# Patient Record
Sex: Female | Born: 1960 | Race: White | Hispanic: No | Marital: Married | Smoking: Former smoker
Health system: Southern US, Community
[De-identification: ages and names within clinical notes are randomized; demographics above are authoritative.]

## PROBLEM LIST (undated history)

## (undated) DIAGNOSIS — I639 Cerebral infarction, unspecified: Secondary | ICD-10-CM

## (undated) DIAGNOSIS — M2540 Effusion, unspecified joint: Secondary | ICD-10-CM

## (undated) DIAGNOSIS — F988 Other specified behavioral and emotional disorders with onset usually occurring in childhood and adolescence: Secondary | ICD-10-CM

## (undated) DIAGNOSIS — Q211 Atrial septal defect: Secondary | ICD-10-CM

## (undated) DIAGNOSIS — T8859XA Other complications of anesthesia, initial encounter: Secondary | ICD-10-CM

## (undated) DIAGNOSIS — K219 Gastro-esophageal reflux disease without esophagitis: Secondary | ICD-10-CM

## (undated) DIAGNOSIS — M1990 Unspecified osteoarthritis, unspecified site: Secondary | ICD-10-CM

## (undated) DIAGNOSIS — Z9989 Dependence on other enabling machines and devices: Secondary | ICD-10-CM

## (undated) DIAGNOSIS — G4733 Obstructive sleep apnea (adult) (pediatric): Secondary | ICD-10-CM

## (undated) DIAGNOSIS — G4730 Sleep apnea, unspecified: Secondary | ICD-10-CM

## (undated) DIAGNOSIS — IMO0002 Reserved for concepts with insufficient information to code with codable children: Secondary | ICD-10-CM

## (undated) DIAGNOSIS — F329 Major depressive disorder, single episode, unspecified: Secondary | ICD-10-CM

## (undated) DIAGNOSIS — M7061 Trochanteric bursitis, right hip: Secondary | ICD-10-CM

## (undated) DIAGNOSIS — G4700 Insomnia, unspecified: Secondary | ICD-10-CM

## (undated) DIAGNOSIS — I809 Phlebitis and thrombophlebitis of unspecified site: Secondary | ICD-10-CM

## (undated) DIAGNOSIS — M2550 Pain in unspecified joint: Secondary | ICD-10-CM

## (undated) DIAGNOSIS — G8929 Other chronic pain: Secondary | ICD-10-CM

## (undated) DIAGNOSIS — M542 Cervicalgia: Secondary | ICD-10-CM

## (undated) DIAGNOSIS — M549 Dorsalgia, unspecified: Secondary | ICD-10-CM

## (undated) DIAGNOSIS — M159 Polyosteoarthritis, unspecified: Secondary | ICD-10-CM

## (undated) DIAGNOSIS — D509 Iron deficiency anemia, unspecified: Secondary | ICD-10-CM

## (undated) DIAGNOSIS — I679 Cerebrovascular disease, unspecified: Secondary | ICD-10-CM

## (undated) DIAGNOSIS — M797 Fibromyalgia: Secondary | ICD-10-CM

## (undated) DIAGNOSIS — I82409 Acute embolism and thrombosis of unspecified deep veins of unspecified lower extremity: Secondary | ICD-10-CM

## (undated) DIAGNOSIS — Z8489 Family history of other specified conditions: Secondary | ICD-10-CM

## (undated) DIAGNOSIS — T4145XA Adverse effect of unspecified anesthetic, initial encounter: Secondary | ICD-10-CM

## (undated) DIAGNOSIS — R519 Headache, unspecified: Secondary | ICD-10-CM

## (undated) DIAGNOSIS — R110 Nausea: Secondary | ICD-10-CM

## (undated) DIAGNOSIS — R7303 Prediabetes: Secondary | ICD-10-CM

## (undated) DIAGNOSIS — M419 Scoliosis, unspecified: Secondary | ICD-10-CM

## (undated) DIAGNOSIS — Q2112 Patent foramen ovale: Secondary | ICD-10-CM

## (undated) DIAGNOSIS — R339 Retention of urine, unspecified: Secondary | ICD-10-CM

## (undated) DIAGNOSIS — F32A Depression, unspecified: Secondary | ICD-10-CM

## (undated) DIAGNOSIS — M19072 Primary osteoarthritis, left ankle and foot: Secondary | ICD-10-CM

## (undated) DIAGNOSIS — E162 Hypoglycemia, unspecified: Secondary | ICD-10-CM

## (undated) DIAGNOSIS — E039 Hypothyroidism, unspecified: Secondary | ICD-10-CM

## (undated) DIAGNOSIS — M19071 Primary osteoarthritis, right ankle and foot: Secondary | ICD-10-CM

## (undated) DIAGNOSIS — M8580 Other specified disorders of bone density and structure, unspecified site: Secondary | ICD-10-CM

## (undated) DIAGNOSIS — R413 Other amnesia: Secondary | ICD-10-CM

## (undated) DIAGNOSIS — R3915 Urgency of urination: Secondary | ICD-10-CM

## (undated) DIAGNOSIS — K5900 Constipation, unspecified: Secondary | ICD-10-CM

## (undated) DIAGNOSIS — M1711 Unilateral primary osteoarthritis, right knee: Secondary | ICD-10-CM

## (undated) DIAGNOSIS — E119 Type 2 diabetes mellitus without complications: Secondary | ICD-10-CM

## (undated) DIAGNOSIS — I879 Disorder of vein, unspecified: Secondary | ICD-10-CM

## (undated) DIAGNOSIS — M545 Low back pain: Secondary | ICD-10-CM

## (undated) DIAGNOSIS — Z973 Presence of spectacles and contact lenses: Secondary | ICD-10-CM

## (undated) DIAGNOSIS — Z86718 Personal history of other venous thrombosis and embolism: Secondary | ICD-10-CM

## (undated) DIAGNOSIS — R002 Palpitations: Secondary | ICD-10-CM

## (undated) DIAGNOSIS — M7062 Trochanteric bursitis, left hip: Secondary | ICD-10-CM

## (undated) HISTORY — DX: Primary osteoarthritis, right ankle and foot: M19.071

## (undated) HISTORY — DX: Morbid (severe) obesity due to excess calories: E66.01

## (undated) HISTORY — PX: CARDIAC CATHETERIZATION: SHX172

## (undated) HISTORY — DX: Disorder of vein, unspecified: I87.9

## (undated) HISTORY — DX: Sleep apnea, unspecified: G47.30

## (undated) HISTORY — DX: Unspecified osteoarthritis, unspecified site: M19.90

## (undated) HISTORY — DX: Low back pain: M54.5

## (undated) HISTORY — PX: VARICOSE VEIN SURGERY: SHX832

## (undated) HISTORY — DX: Hypoglycemia, unspecified: E16.2

## (undated) HISTORY — DX: Palpitations: R00.2

## (undated) HISTORY — DX: Fibromyalgia: M79.7

## (undated) HISTORY — DX: Hypothyroidism, unspecified: E03.9

## (undated) HISTORY — DX: Cerebral infarction, unspecified: I63.9

## (undated) HISTORY — PX: KNEE ARTHROPLASTY: SHX992

## (undated) HISTORY — DX: Trochanteric bursitis, left hip: M70.62

## (undated) HISTORY — PX: ESOPHAGOGASTRODUODENOSCOPY: SHX1529

## (undated) HISTORY — DX: Polyosteoarthritis, unspecified: M15.9

## (undated) HISTORY — PX: KNEE ARTHROSCOPY W/ ACL RECONSTRUCTION: SHX1858

## (undated) HISTORY — PX: EXPLORATORY LAPAROTOMY: SUR591

## (undated) HISTORY — DX: Phlebitis and thrombophlebitis of unspecified site: I80.9

## (undated) HISTORY — DX: Urgency of urination: R39.15

## (undated) HISTORY — DX: Reserved for concepts with insufficient information to code with codable children: IMO0002

## (undated) HISTORY — DX: Cervicalgia: M54.2

## (undated) HISTORY — PX: JOINT REPLACEMENT: SHX530

## (undated) HISTORY — DX: Unilateral primary osteoarthritis, right knee: M17.11

## (undated) HISTORY — DX: Cerebrovascular disease, unspecified: I67.9

## (undated) HISTORY — DX: Trochanteric bursitis, right hip: M70.61

## (undated) HISTORY — PX: VAGINAL HYSTERECTOMY: SUR661

## (undated) HISTORY — DX: Other amnesia: R41.3

## (undated) HISTORY — DX: Other chronic pain: G89.29

## (undated) HISTORY — DX: Primary osteoarthritis, left ankle and foot: M19.072

## (undated) HISTORY — PX: LAPAROSCOPIC CHOLECYSTECTOMY: SUR755

## (undated) HISTORY — PX: TOTAL KNEE ARTHROPLASTY WITH HARDWARE REMOVAL: SHX6437

## (undated) HISTORY — PX: KNEE ARTHROSCOPY: SHX127

---

## 2000-07-29 ENCOUNTER — Encounter: Payer: Self-pay | Admitting: *Deleted

## 2000-07-29 ENCOUNTER — Ambulatory Visit (HOSPITAL_COMMUNITY): Admission: RE | Admit: 2000-07-29 | Discharge: 2000-07-29 | Payer: Self-pay | Admitting: *Deleted

## 2000-08-09 ENCOUNTER — Ambulatory Visit (HOSPITAL_COMMUNITY): Admission: RE | Admit: 2000-08-09 | Discharge: 2000-08-09 | Payer: Self-pay | Admitting: *Deleted

## 2000-08-09 ENCOUNTER — Encounter: Payer: Self-pay | Admitting: *Deleted

## 2000-08-20 ENCOUNTER — Encounter: Payer: Self-pay | Admitting: *Deleted

## 2000-08-20 ENCOUNTER — Ambulatory Visit (HOSPITAL_COMMUNITY): Admission: RE | Admit: 2000-08-20 | Discharge: 2000-08-20 | Payer: Self-pay | Admitting: *Deleted

## 2000-11-20 ENCOUNTER — Encounter: Payer: Self-pay | Admitting: *Deleted

## 2000-11-20 ENCOUNTER — Ambulatory Visit (HOSPITAL_COMMUNITY): Admission: RE | Admit: 2000-11-20 | Discharge: 2000-11-20 | Payer: Self-pay | Admitting: *Deleted

## 2001-06-11 ENCOUNTER — Encounter (HOSPITAL_COMMUNITY): Admission: RE | Admit: 2001-06-11 | Discharge: 2001-07-11 | Payer: Self-pay | Admitting: Urology

## 2002-01-01 ENCOUNTER — Encounter: Payer: Self-pay | Admitting: *Deleted

## 2002-01-01 ENCOUNTER — Ambulatory Visit (HOSPITAL_COMMUNITY): Admission: RE | Admit: 2002-01-01 | Discharge: 2002-01-01 | Payer: Self-pay | Admitting: *Deleted

## 2002-01-02 ENCOUNTER — Ambulatory Visit (HOSPITAL_COMMUNITY): Admission: RE | Admit: 2002-01-02 | Discharge: 2002-01-02 | Payer: Self-pay | Admitting: *Deleted

## 2002-01-02 ENCOUNTER — Encounter: Payer: Self-pay | Admitting: *Deleted

## 2002-04-02 ENCOUNTER — Inpatient Hospital Stay (HOSPITAL_COMMUNITY): Admission: RE | Admit: 2002-04-02 | Discharge: 2002-04-04 | Payer: Self-pay | Admitting: *Deleted

## 2002-08-28 ENCOUNTER — Emergency Department (HOSPITAL_COMMUNITY): Admission: EM | Admit: 2002-08-28 | Discharge: 2002-08-28 | Payer: Self-pay | Admitting: Emergency Medicine

## 2002-08-28 ENCOUNTER — Encounter: Payer: Self-pay | Admitting: Emergency Medicine

## 2002-08-31 ENCOUNTER — Encounter: Payer: Self-pay | Admitting: *Deleted

## 2002-08-31 ENCOUNTER — Ambulatory Visit (HOSPITAL_COMMUNITY): Admission: RE | Admit: 2002-08-31 | Discharge: 2002-08-31 | Payer: Self-pay | Admitting: *Deleted

## 2002-09-08 ENCOUNTER — Ambulatory Visit (HOSPITAL_COMMUNITY): Admission: RE | Admit: 2002-09-08 | Discharge: 2002-09-08 | Payer: Self-pay | Admitting: *Deleted

## 2002-09-08 ENCOUNTER — Encounter: Payer: Self-pay | Admitting: *Deleted

## 2002-11-06 ENCOUNTER — Ambulatory Visit (HOSPITAL_COMMUNITY): Admission: RE | Admit: 2002-11-06 | Discharge: 2002-11-06 | Payer: Self-pay | Admitting: Internal Medicine

## 2002-11-06 ENCOUNTER — Encounter (INDEPENDENT_AMBULATORY_CARE_PROVIDER_SITE_OTHER): Payer: Self-pay | Admitting: Internal Medicine

## 2002-11-10 ENCOUNTER — Ambulatory Visit (HOSPITAL_COMMUNITY): Admission: RE | Admit: 2002-11-10 | Discharge: 2002-11-10 | Payer: Self-pay | Admitting: Internal Medicine

## 2002-12-10 ENCOUNTER — Other Ambulatory Visit: Admission: RE | Admit: 2002-12-10 | Discharge: 2002-12-10 | Payer: Self-pay | Admitting: Unknown Physician Specialty

## 2003-03-22 ENCOUNTER — Ambulatory Visit (HOSPITAL_COMMUNITY): Admission: RE | Admit: 2003-03-22 | Discharge: 2003-03-22 | Payer: Self-pay | Admitting: *Deleted

## 2003-03-31 ENCOUNTER — Ambulatory Visit (HOSPITAL_COMMUNITY): Admission: RE | Admit: 2003-03-31 | Discharge: 2003-03-31 | Payer: Self-pay | Admitting: Internal Medicine

## 2003-10-26 ENCOUNTER — Ambulatory Visit (HOSPITAL_COMMUNITY): Admission: RE | Admit: 2003-10-26 | Discharge: 2003-10-26 | Payer: Self-pay | Admitting: Pulmonary Disease

## 2003-10-27 ENCOUNTER — Ambulatory Visit (HOSPITAL_COMMUNITY): Admission: RE | Admit: 2003-10-27 | Discharge: 2003-10-27 | Payer: Self-pay | Admitting: Pulmonary Disease

## 2004-03-09 ENCOUNTER — Ambulatory Visit (HOSPITAL_COMMUNITY): Admission: RE | Admit: 2004-03-09 | Discharge: 2004-03-09 | Payer: Self-pay | Admitting: Pediatrics

## 2004-05-08 ENCOUNTER — Ambulatory Visit: Admission: RE | Admit: 2004-05-08 | Discharge: 2004-05-08 | Payer: Self-pay | Admitting: Family Medicine

## 2004-05-19 ENCOUNTER — Ambulatory Visit: Payer: Self-pay | Admitting: Pulmonary Disease

## 2005-03-14 ENCOUNTER — Ambulatory Visit (HOSPITAL_COMMUNITY): Admission: RE | Admit: 2005-03-14 | Discharge: 2005-03-14 | Payer: Self-pay | Admitting: Family Medicine

## 2005-03-22 ENCOUNTER — Ambulatory Visit: Payer: Self-pay | Admitting: Internal Medicine

## 2005-03-27 ENCOUNTER — Ambulatory Visit (HOSPITAL_COMMUNITY): Admission: RE | Admit: 2005-03-27 | Discharge: 2005-03-27 | Payer: Self-pay | Admitting: Internal Medicine

## 2005-03-28 ENCOUNTER — Ambulatory Visit: Payer: Self-pay | Admitting: *Deleted

## 2005-03-29 ENCOUNTER — Ambulatory Visit (HOSPITAL_COMMUNITY): Admission: RE | Admit: 2005-03-29 | Discharge: 2005-03-29 | Payer: Self-pay | Admitting: *Deleted

## 2005-03-29 ENCOUNTER — Ambulatory Visit: Payer: Self-pay | Admitting: Cardiology

## 2005-04-05 ENCOUNTER — Encounter: Admission: RE | Admit: 2005-04-05 | Discharge: 2005-07-04 | Payer: Self-pay | Admitting: Rheumatology

## 2005-04-06 ENCOUNTER — Ambulatory Visit: Payer: Self-pay | Admitting: Internal Medicine

## 2005-04-06 ENCOUNTER — Ambulatory Visit (HOSPITAL_COMMUNITY): Admission: RE | Admit: 2005-04-06 | Discharge: 2005-04-06 | Payer: Self-pay | Admitting: Internal Medicine

## 2005-05-08 ENCOUNTER — Ambulatory Visit: Payer: Self-pay | Admitting: *Deleted

## 2005-07-06 ENCOUNTER — Ambulatory Visit: Payer: Self-pay | Admitting: Internal Medicine

## 2005-07-10 ENCOUNTER — Ambulatory Visit (HOSPITAL_COMMUNITY): Admission: RE | Admit: 2005-07-10 | Discharge: 2005-07-10 | Payer: Self-pay | Admitting: Internal Medicine

## 2005-07-20 ENCOUNTER — Ambulatory Visit (HOSPITAL_COMMUNITY): Admission: RE | Admit: 2005-07-20 | Discharge: 2005-07-20 | Payer: Self-pay | Admitting: Internal Medicine

## 2005-09-13 ENCOUNTER — Ambulatory Visit (HOSPITAL_COMMUNITY): Admission: RE | Admit: 2005-09-13 | Discharge: 2005-09-13 | Payer: Self-pay | Admitting: *Deleted

## 2005-09-27 ENCOUNTER — Ambulatory Visit (HOSPITAL_COMMUNITY): Admission: RE | Admit: 2005-09-27 | Discharge: 2005-09-27 | Payer: Self-pay | Admitting: *Deleted

## 2005-10-04 ENCOUNTER — Ambulatory Visit: Payer: Self-pay | Admitting: Internal Medicine

## 2005-11-12 ENCOUNTER — Encounter (INDEPENDENT_AMBULATORY_CARE_PROVIDER_SITE_OTHER): Payer: Self-pay | Admitting: Specialist

## 2005-11-12 ENCOUNTER — Ambulatory Visit (HOSPITAL_COMMUNITY): Admission: RE | Admit: 2005-11-12 | Discharge: 2005-11-12 | Payer: Self-pay | Admitting: General Surgery

## 2005-11-15 ENCOUNTER — Ambulatory Visit (HOSPITAL_COMMUNITY): Admission: RE | Admit: 2005-11-15 | Discharge: 2005-11-15 | Payer: Self-pay | Admitting: Family Medicine

## 2005-11-23 ENCOUNTER — Ambulatory Visit (HOSPITAL_COMMUNITY): Admission: RE | Admit: 2005-11-23 | Discharge: 2005-11-23 | Payer: Self-pay | Admitting: Cardiovascular Disease

## 2005-11-23 ENCOUNTER — Ambulatory Visit: Payer: Self-pay | Admitting: Cardiovascular Disease

## 2005-11-30 ENCOUNTER — Inpatient Hospital Stay (HOSPITAL_BASED_OUTPATIENT_CLINIC_OR_DEPARTMENT_OTHER): Admission: RE | Admit: 2005-11-30 | Discharge: 2005-11-30 | Payer: Self-pay | Admitting: Cardiovascular Disease

## 2005-11-30 ENCOUNTER — Ambulatory Visit: Payer: Self-pay | Admitting: Cardiovascular Disease

## 2005-12-03 ENCOUNTER — Ambulatory Visit (HOSPITAL_COMMUNITY): Admission: RE | Admit: 2005-12-03 | Discharge: 2005-12-03 | Payer: Self-pay | Admitting: Cardiovascular Disease

## 2005-12-05 ENCOUNTER — Ambulatory Visit (HOSPITAL_COMMUNITY): Admission: RE | Admit: 2005-12-05 | Discharge: 2005-12-05 | Payer: Self-pay | Admitting: Cardiovascular Disease

## 2005-12-05 ENCOUNTER — Ambulatory Visit: Payer: Self-pay | Admitting: Cardiovascular Disease

## 2005-12-12 ENCOUNTER — Ambulatory Visit (HOSPITAL_COMMUNITY): Admission: RE | Admit: 2005-12-12 | Discharge: 2005-12-12 | Payer: Self-pay | Admitting: Cardiovascular Disease

## 2005-12-28 ENCOUNTER — Ambulatory Visit (HOSPITAL_COMMUNITY): Admission: RE | Admit: 2005-12-28 | Discharge: 2005-12-28 | Payer: Self-pay | Admitting: Family Medicine

## 2006-01-07 ENCOUNTER — Ambulatory Visit (HOSPITAL_COMMUNITY): Admission: RE | Admit: 2006-01-07 | Discharge: 2006-01-07 | Payer: Self-pay | Admitting: Family Medicine

## 2006-02-04 ENCOUNTER — Ambulatory Visit (HOSPITAL_COMMUNITY): Admission: RE | Admit: 2006-02-04 | Discharge: 2006-02-04 | Payer: Self-pay | Admitting: Orthopedic Surgery

## 2006-02-15 ENCOUNTER — Ambulatory Visit (HOSPITAL_COMMUNITY): Admission: RE | Admit: 2006-02-15 | Discharge: 2006-02-15 | Payer: Self-pay | Admitting: Internal Medicine

## 2006-02-15 ENCOUNTER — Encounter (INDEPENDENT_AMBULATORY_CARE_PROVIDER_SITE_OTHER): Payer: Self-pay | Admitting: *Deleted

## 2006-11-11 ENCOUNTER — Ambulatory Visit (HOSPITAL_COMMUNITY): Admission: RE | Admit: 2006-11-11 | Discharge: 2006-11-11 | Payer: Self-pay | Admitting: Family Medicine

## 2007-07-01 ENCOUNTER — Ambulatory Visit (HOSPITAL_COMMUNITY): Admission: RE | Admit: 2007-07-01 | Discharge: 2007-07-01 | Payer: Self-pay | Admitting: Internal Medicine

## 2007-11-12 ENCOUNTER — Ambulatory Visit (HOSPITAL_COMMUNITY): Admission: RE | Admit: 2007-11-12 | Discharge: 2007-11-12 | Payer: Self-pay | Admitting: Family Medicine

## 2008-11-19 ENCOUNTER — Ambulatory Visit (HOSPITAL_COMMUNITY): Admission: RE | Admit: 2008-11-19 | Discharge: 2008-11-19 | Payer: Self-pay | Admitting: Family Medicine

## 2009-06-21 ENCOUNTER — Ambulatory Visit (HOSPITAL_COMMUNITY): Admission: RE | Admit: 2009-06-21 | Discharge: 2009-06-21 | Payer: Self-pay | Admitting: Family Medicine

## 2009-08-09 ENCOUNTER — Ambulatory Visit: Payer: Self-pay | Admitting: Vascular Surgery

## 2010-02-11 ENCOUNTER — Encounter: Payer: Self-pay | Admitting: Family Medicine

## 2010-02-12 ENCOUNTER — Encounter: Payer: Self-pay | Admitting: Orthopedic Surgery

## 2010-02-12 ENCOUNTER — Encounter: Payer: Self-pay | Admitting: Cardiovascular Disease

## 2010-05-16 ENCOUNTER — Encounter: Payer: 59 | Attending: Family Medicine | Admitting: Dietician

## 2010-05-16 DIAGNOSIS — E663 Overweight: Secondary | ICD-10-CM | POA: Insufficient documentation

## 2010-05-16 DIAGNOSIS — Z713 Dietary counseling and surveillance: Secondary | ICD-10-CM | POA: Insufficient documentation

## 2010-06-06 NOTE — Procedures (Signed)
LOWER EXTREMITY VENOUS REFLUX EXAM   INDICATION:  Right lower extremity varicose veins with swelling.   EXAM:  Using color-flow imaging and pulse Doppler spectral analysis, the  right common femoral, superficial femoral, popliteal, posterior tibial,  greater and lesser saphenous veins are evaluated.  There is no evidence  suggesting deep venous insufficiency in the right lower extremity.   The right saphenofemoral junction is not competent with reflux of  >545milliseconds. The right GSV is not competent with reflux of  >570milliseconds with the caliber as described below.   The right proximal short saphenous vein demonstrates competency.   GSV Diameter (used if found to be incompetent only)                                            Right    Left  Proximal Greater Saphenous Vein           0.29 cm  cm  Proximal-to-mid-thigh                     cm       cm  Mid thigh                                 0.33 cm  cm  Mid-distal thigh                          cm       cm  Distal thigh                              0.48 cm  cm  Knee                                      0.36 cm  cm   IMPRESSION:  1. Right greater saphenous vein reflux with >558milliseconds is      identified with the caliber ranging from 0.29 cm to 0.48 cm knee to      groin.  2. The right greater saphenous vein is not tortuous.  3. The deep venous system is competent.  4. The right lesser saphenous vein is competent.  5. Evidence of superficial thrombus located in varicose veins off of      the greater saphenous vein.   ___________________________________________  Quita Skye. Hart Rochester, M.D.   AS/MEDQ  D:  08/09/2009  T:  08/09/2009  Job:  [id number]

## 2010-06-06 NOTE — Consult Note (Signed)
NEW PATIENT CONSULTATION   Debbie Pineda, Debbie Pineda  DOB:  [birth year]                                       08/09/2009  CHART#:[id number]   A 50 year old female developed thrombophlebitis in her right leg on May  27 at the medial and anterior aspect of the thigh.  By early July her  symptoms had resolved.  She had one previous episode of thrombophlebitis  in the right lower leg about 7 years ago.  She has no history of DVT,  venous stasis ulcers, bleeding, ulceration, etc.  She did try to wear  elastic compression stockings many years ago and this was unsuccessful  because of her obesity.  She was referred for further venous evaluation  today.  She did have a procedure performed by a Dr. Elpidio Anis 20 plus  years ago where a varicose vein was removed and ligated according to the  patient but no vein stripping was performed.  She elevates her legs on  occasion which makes them feel better and does not take ibuprofen on a  regular basis.   CHRONIC MEDICAL PROBLEMS:  1. Fibromyalgia.  2. History of stroke with a patent foramen ovale.  3. Hypothyroidism.  4. Negative for hypertension, coronary artery disease, COPD.   FAMILY HISTORY:  Positive for coronary artery disease and diabetes in  multiple family members, negative for stroke.   SOCIAL HISTORY:  She is married, has one child, is retired due to  disability.  Has not smoked in 20 years.  Does not use alcohol.   REVIEW OF SYSTEMS:  Positive for dyspnea on exertion and at rest.  No  wheezing or chronic bronchitis.  She does have discomfort in both legs  with walking, arthritis joint pain, muscle pain, depression.  All other  systems are negative in review of systems.   PHYSICAL EXAMINATION:  Vital signs:  Blood pressure 112/78, heart rate  is 84, temperature 98.4, respirations 14.  General:  She is an obese,  well-nourished female who is in no apparent distress, alert and oriented  times 3.  HEENT:  Exam is normal  for age.  EOMs intact.  Neck:  Supple,  3+ carotid pulses.  No bruits.  Lungs:  Are clear to auscultation.  No  rhonchi or wheezing.  Cardiovascular:  Regular rhythm.  No murmurs.  Abdomen:  Obese.  No palpable masses.  Musculoskeletal:  Free of major  deformities.  Neurological:  Normal.  Lower extremity exam reveals 3+  femoral, popliteal and dorsalis pedis pulses bilaterally.  There is  evidence of resolving thrombophlebitis along the anteromedial aspect of  the right thigh extending down into the mid thigh area.  There is no  tenderness at this point.  No bulging varicosities distally are noted.   Today I ordered a venous duplex exam which I reviewed and interpreted.  There is no evidence of deep venous obstruction on the right.  She does  have some reflux at the junction of the right saphenofemoral junction  but the great saphenous vein becomes quite small distal to this and it  does have reflux throughout but is of small caliber.  There is some  reflux extending into the area laterally where the thrombosis occurred.   I do not think any treatment is indicated at this time since her great  saphenous vein is quite small although  it does have reflux.  If she  develops recurrent episodes of thrombophlebitis we may need to consider  closure of the right great saphenous vein if it is able to be accessed.  She will return to see [place] on a p.r.n. basis.     Quita Skye Hart Rochester, M.Pineda.  Electronically Signed   JDL/MEDQ  Pineda:  08/09/2009  T:  08/10/2009  Job:  [id number]   cc:   Corrie Mckusick, M.Pineda.  Kirk Ruths, M.Pineda.

## 2010-06-09 NOTE — Op Note (Signed)
Debbie Pineda, Debbie Pineda             ACCOUNT NO.:  [id number]   MEDICAL RECORD NO.:  [id number]          PATIENT TYPE:  AMB   LOCATION:  SDC                           FACILITY:  [facility]   PHYSICIAN:  Ilda Mori, M.D.   DATE OF BIRTH:  10/06/1960   DATE OF PROCEDURE:  02/15/2006  DATE OF DISCHARGE:                               OPERATIVE REPORT   PREOPERATIVE DIAGNOSIS:  Pelvic masses, probable tubal remnants.   POSTOPERATIVE DIAGNOSIS:  Bilateral remnants of the fallopian tube with  torsion of the segment on the right.   PROCEDURE:  Laparoscopy with removal of tubal remnants, lysis of  adhesions and cystoscopy.   SURGEON:  Dr. Ilda Mori.   ASSISTANT:  Dr. Waynard Reeds.   ANESTHESIA:  General.   ESTIMATED BLOOD LOSS:  50 mL.   FINDINGS:  A cystic structure in the right adnexa which appeared to be  torsed.  It was approximately 7 cm in size.  Although it was torsed, the  tissue was pink with no evidence of necrosis. On the left, there was a  smaller segment approximately 3-4 cm of a cystic structure that was  adherent to the left pelvic sidewall. The ureters could not be seen  laparoscopically.   INDICATIONS:  This is a 50 year old female who is 3 years status post  vaginal hysterectomy with bilateral salpingo-oophorectomy.  The patient  was noted to have cystic structures in the abdomen on a CAT scan that  was done for evaluation of abdominal pain.  The structures appeared to  be tubal on CAT scan and a follow-up MRI and ultrasound both confirmed  what appeared to be masses consistent with hydrosalpinges. The operative  note and pathology report were studied and both described the fallopian  tubes as having been removed. These findings were discussed with the  patient, a decision was made to proceed with laparoscopy and removal of  these persistent abdominopelvic masses.   DESCRIPTION OF PROCEDURE:  The patient was taken to the operating room,  placed in the supine  position where the abdomen was prepped and draped  in a sterile fashion.  The patient was placed in dorsal supine position.  The bladder was catheterized. The abdomen was entered through an open  laparoscopic incision without difficulty.  The fascia was tagged with a  Vicryl suture and this tag was used to affix the Hassan cannula to the  peritoneum.  The laparoscope was placed and accessory instruments were  placed in the right and left lower quadrants.  The pelvis was viewed  with the findings noted above. The right ovarian remnant was untorsed  and the peritoneal attachments were cauterized and cut until the mass  was freed. The areas of coagulation cutting were carefully inspected and  appeared to be well above where the ureter could be anatomically  located. However, because of the patient's weight and subperitoneal fat,  the peristalsis of the ureter could not be identified.  The left side  was then evaluated and a small what appeared to be an occlusion cyst or  tubal remnant was noted.  This was also affixed  to the pelvic sidewall.  Again it appeared to be affixed in the area of the round ligament and  well above the ureter, but the ureter again could not be seen  peristalsing below the mass. The mass was carefully dissected free  taking care not to coagulate or cut deeply into any tissue and this mass  was free. The laparoscope was removed from the 10 mm port and an Endobag  was placed. A laparoscope was placed in one of the lower 5 mm sheaths.  The specimens were both placed in the Endobag and the Endobag was  removed through the umbilical incision. The Hassan cannula was replaced  and the laparoscope was reintroduced and hemostasis was noted to be  present and the pelvis appeared to be free. Once again, an attempt was  made to identify the ureters and this could not be accomplished. The  instruments were removed, the Vicryl suture that had been placed in the  fascia at the  umbilicus was tied, the skin was closed with subcuticular  4-0 Dexon suture.  The lower right and left port incisions were closed  with Dermabond. The surgeon then placed a cystoscope into the bladder  and both ureteral orifices were identified and could be seen creating  jets of fluid. This was felt to demonstrate the patency of the ureters  bilaterally.  The procedure was then terminated and the patient left the  operating room in good condition.      Ilda Mori, M.D.  Electronically Signed     RK/MEDQ  D:  02/15/2006  T:  02/15/2006  Job:  [id number]

## 2010-06-09 NOTE — Consult Note (Signed)
NAME:  Debbie Pineda, Debbie Pineda                       ACCOUNT NO.:  [id number]   MEDICAL RECORD NO.:  [id number]                   PATIENT TYPE:  AMB   LOCATION:  DAY                                  FACILITY:  [facility]   PHYSICIAN:  Dennie Maizes, M.D.                DATE OF BIRTH:  1960-12-29   DATE OF CONSULTATION:  DATE OF DISCHARGE:                                   CONSULTATION   PREOPERATIVE CONSULTATION REPORT:   CHIEF COMPLAINT:  Urinary incontinence, uterine prolapse, bladder prolapse.   HISTORY OF PRESENT ILLNESS:  This 50 year old female has been referred to me  by Dr. Lisette Grinder for evaluation and management of urinary incontinence.  The  patient has been having urinary leakage during coughing and sneezing for the  past 1 year.  The urinary leakage is getting worse.  She also has  experienced urinary leakage during horse back riding and singing in the  church.  She feels like she is emptying the bladder incompletely at times.  She did not have any urinary urgency.  She has urinary frequency x3-4 and  nocturia x1.  There is no history of urge urinary incontinence, hematuria,  urolithiasis, or history of urinary tract infections.  She feels that her  uterus has dropped down.  She can feel the cervix at the vaginal introitus  at times. She also has noticed bladder prolapse.  She has noted pelvic  pressure after longstanding.   PAST MEDICAL HISTORY:  History of degenerative disk disease and degenerative  joint disease, depression, status post varicose vein surgery.   MEDICATIONS:  1. Celebrex 400 mg 1 p.o. daily.  2. Lexapro 10 mg 1 p.o. daily.  3. Lortab p.r.n. for pain.  4. Tylenol and Advil p.r.n. for pain.   ALLERGIES:  Sulfa which causes rashes.   FAMILY HISTORY:  Her family history is positive for hypothyroidism, diabetes  mellitus, heart disease, and hypertension.   PHYSICAL EXAMINATION:  VITAL SIGNS:  Weight 230 pounds, height 5 feet 6  inches.  HEAD, EYES, EARS,  NOSE, AND THROAT:  Normal.  NECK:  No masses.  LUNGS:  Clear to auscultation.  HEART:  Regular rate and rhythm no murmurs.  ABDOMEN:  Soft.  No palpable flank mass.  No CVA tenderness.  Bladder not  palpable.  PELVIC:  Examination reveled uterine prolapse as well as moderate-sized  cystocele and rectocele.   LABORATORY DATA:  The patient has undergone urological evaluation in the  office.  Catheterization revealed a postvoid urine of 20 mL.  Sterile water  was instilled into the bladder and a cystometrogram was done.  The patient  had normal bladder sensations and could feel the filling of the bladder to a  volume of 66 mL.  She had a definite urge to void at 90 mL and the bladder  capacity was 342 mL.  There were no __________ bladder contractions.  Leak  back pressure was  more than 100 cm of water and the patient had a moderate  degree of stress urinary incontinence during coughing.  Urethral  hypermobility was noted.   Cystoscopy was done under local anesthesia in the office; and there was no  evidence of any abnormality inside the bladder.  Urethra calibrated to 24  [place].  There was no evidence of urethral stenosis.  Moderate size  cystocele, rectocele, as well as uterine prolapse were noted.  The patient  had moderate degree of stress urinary incontinence during coughing and  Marshall test was positive.   IMPRESSION:  Stress urinary incontinence, pelvic relaxation, uterine  prolapse, cystocele, rectocele.   PLAN:  1. I have discussed with the patient regarding management options of stress     urinary incontinence. She is planning to have vaginal hysterectomy,     bilateral salpingo-oophorectomy, anterior and posterior repair by Dr.     Lisette Grinder.  We will combine bladder suspension procedure at the same time.  2. I discussed with her about transvaginal tape ureteral sling procedure     during vaginal hysterectomy.  I explained to her regarding the operative     details,  outcome, possible risks and complications. I explained to her     regarding injury to the bladder, ureters, intestines, and blood loss.     The risks of bleeding, infection, urinary retention, post stress     incontinence, and reoperation were explained to the patient.  She made     __________; she may be on catheter drainage for a long time; she may need     intermittent catheterization.  3. If Dr. Lisette Grinder is unable to do a bilateral salpingo-oophorectomy through     the vagina, he plans to do bilateral salpingo-oophorectomy through a     suprapubic incision.  If the patient needs a suprapubic operation, we     will do a Burch vesicourethropexy instead of the TVT procedure.  Thi was     explained to the patient and she is agreeable for this plan.  All her     questions were answered.  She will be admitted to the hospital in the     postoperative period.                                               Dennie Maizes, M.D.    SK/MEDQ  D:  04/01/2002  T:  04/01/2002  Job:  [id number]   cc:   Langley Gauss, M.D.  [address]  [place]  [place] [address]  Fax: [phone]

## 2010-06-09 NOTE — Discharge Summary (Signed)
NAME:  Debbie Pineda, Debbie Pineda                       ACCOUNT NO.:  [id number]   MEDICAL RECORD NO.:  [id number]                   PATIENT TYPE:  INP   LOCATION:  A327                                 FACILITY:  [facility]   PHYSICIAN:  Langley Gauss, M.D.                DATE OF BIRTH:  Jun 12, 1960   DATE OF ADMISSION:  04/02/2002  DATE OF DISCHARGE:  04/04/2002                                 DISCHARGE SUMMARY   DISCHARGE DIAGNOSIS:  Status post vaginal hysterectomy, bilateral salpingo-  oophorectomy, anterior and posterior repair as well as TVT.   DISPOSITION:  The patient is to be seen by Dr. Sophronia Simas in one week's time.  She will follow up in our office in two weeks' time for evaluation of the  vaginal area and evaluation of the patient's suture lines.  The patient does  have a Foley catheter in place at the time of discharge which will be left  in place for about one week's duration secondary to very small nick in the  bladder which was seen during Dr. Dion Saucier cystoscopy.   DISCHARGE MEDICATIONS:  1. Climara patch for hormone replacement therapy.  2. Tylox for pain relief.  3. Levaquin 250 mg p.o. daily.   PERTINENT LABORATORY STUDIES:  Admission hemoglobin 13.1, hematocrit 37.3,  white count 8.9.  On postoperative day #1, hemoglobin 10.2, hematocrit 59.3.  On postoperative day #2, hemoglobin 10.5, hematocrit 30.8, with a white  count of 11.2.  Electrolytes within normal limits.  Liver function tests  within normal limits.  HCG negative.  B positive blood type.   HOSPITAL COURSE:  Operative procedure performed on 04/02/2002.  The Foley  catheter was left in place postoperatively.  In addition, a vaginal packing  was in place.  The patient did well as far as pain relief on the evening of  surgery.  She did have a PCA morphine pump.  She also received additional IV  Phenergan as needed for complaints of nausea.  She had no complaints of hot  flashes.  On postoperative day #1, the  patient had excellent urine output  and had moistened but no saturated the vaginal packing.  The vaginal packing  was removed.  The Foley catheter was left in place.  The patient was very  hungry and advanced her diet very rapidly such that on postoperative day #1  the patient was tolerating a regular general diet; however, she continued to  use the PCA morphine pump until the late p.m. of postoperative day #1 at  which time she was switched to oral medications.  On postoperative day #2,  the patient has been ambulatory.  She has no complaints of vaginal bleeding.  The Foley catheter was in place draining clear yellow urine, and this does  not give her any urethral irritation or discomfort.  Thus, the patient is  discharged to home on postoperative day #2.  She does have a tube  of  MetroGel from postoperative course which the patient is advised to use  q.h.s. p.r.n. for complaints of vaginal discharge associated with the  intravaginal suture lines.  Final pathology is currently pending from  operative procedure.                                               Langley Gauss, M.D.    DC/MEDQ  D:  04/06/2002  T:  04/06/2002  Job:  [id number]

## 2010-06-09 NOTE — Procedures (Signed)
NAMEJASZMINE, Debbie Pineda             ACCOUNT NO.:  [id number]   MEDICAL RECORD NO.:  [id number]          PATIENT TYPE:  OUT   LOCATION:  SLEEP LAB                     FACILITY:  [facility]   PHYSICIAN:  Marcelyn Bruins, M.D. [facility] DATE OF BIRTH:  1960/09/17   DATE OF STUDY:  05/08/2004                              NOCTURNAL POLYSOMNOGRAM   REFERRING PHYSICIAN:  Nicoletta Ba, MD   INDICATIONS FOR THE STUDY:  Hypersomnia with sleep apnea.   SLEEP ARCHITECTURE:  The patient had total sleep time of 375 minutes with  adequate slow wave sleep but decreased REM. Sleep onset latency was normal  as was REM onset.   IMPRESSION:  1.  Moderate obstructive sleep apnea/hypopnea syndrome with a respiratory      disturbance index of 33 events per hour and O2 desaturation as low as      86%. The events were clearly worse during REM. Treatment options for      this degree of sleep apnea include weight loss, upper airway surgery,      possibly oral appliance, as well as C-PAP therapy.  2.  Loud to very loud snoring noted throughout the study.  3.  No clinically significant cardiac arrhythmias.      KC/MEDQ  D:  05/16/2004 13:35:21  T:  05/16/2004 15:02:05  Job:  [id number]

## 2010-06-09 NOTE — H&P (Signed)
NAME:  Debbie Pineda, Debbie Pineda             ACCOUNT NO.:  [id number]   MEDICAL RECORD NO.:  [id number]          PATIENT TYPE:  AMB   LOCATION:  DAY                           FACILITY:  [facility]   PHYSICIAN:  Dalia Heading, M.D.  DATE OF BIRTH:  Nov 14, 1960   DATE OF ADMISSION:  DATE OF DISCHARGE:  LH                                HISTORY & PHYSICAL   CHIEF COMPLAINT:  Chronic cholecystitis, fatty liver.   HISTORY OF PRESENT ILLNESS:  The patient is a 50 year old white female who  is referred for evaluation and treatment of biliary colic secondary to  chronic cholecystitis.  She has been having right upper quadrant abdominal  pain and bloating for many years.  She does have some fatty food  intolerance.  No fever, chills, or jaundice have been noted.   PAST MEDICAL HISTORY:  Includes hypothyroidism, reflux disease,  fibromyalgia, arthritis.   PAST SURGICAL HISTORY:  Vaginal hysterectomy, right knee surgery, vein  sclerosing.   CURRENT MEDICATIONS:  Lyrica, Strattera, Cymbalta, Prevacid, hydrocodone,  Lasix, Ambien, Xanax, Armour thyroid supplements.   ALLERGIES:  SULFA which causes itching, MORPHINE which causes itching.   REVIEW OF SYSTEMS:  The patient denies smoking.  She drinks alcohol  socially.   PHYSICAL EXAMINATION:  GENERAL:  The patient is an overweight white female in no acute distress.  HEENT: Examination reveals no scleral icterus.  LUNGS:  Clear to auscultation with equal breath sounds bilaterally.  HEART:  Examination reveals regular rate and rhythm without S3, S4, or  murmurs.  ABDOMEN:  Soft with tenderness in the right upper quadrant to palpation.  No  hepatosplenomegaly, masses, or herniae are identified.   Ultrasound of the gallbladder is negative except for fatty liver.  HIDA scan  reveals chronic cholecystitis with a low gallbladder ejection fraction.   IMPRESSION:  Chronic cholecystitis.   PLAN:  The patient is scheduled for laparoscopic cholecystectomy with  liver  biopsy on November 12, 2005.  The risks and benefits of the procedure  including bleeding, infection, hepatobiliary injury, the possibility of an  open procedure were fully explained to the patient, who gave informed  consent.      Dalia Heading, M.D.  Electronically Signed     MAJ/MEDQ  D:  10/18/2005  T:  10/18/2005  Job:  [id number]   cc:   Kirk Ruths, M.D.  Fax: [phone]

## 2010-06-09 NOTE — H&P (Signed)
NAME:  Debbie Pineda, Debbie Pineda                       ACCOUNT NO.:  [id number]   MEDICAL RECORD NO.:  [id number]                   PATIENT TYPE:  INP   LOCATION:  A327                                 FACILITY:  [facility]   PHYSICIAN:  Langley Gauss, M.D.                DATE OF BIRTH:  1960/11/01   DATE OF ADMISSION:  04/02/2002  DATE OF DISCHARGE:                                HISTORY & PHYSICAL   HISTORY OF PRESENT ILLNESS:  The patient is a 50 year old gravida 1 para 1  with one prior vaginal delivery who complains of symptoms of pelvic prolapse  with she states the cervix palpable at the introitus at times, also has a  cystocele with genuine stress urinary incontinence and a symptomatic  rectocele resulting in constipation which she frequently and manually  manipulates in an effort to start a bowel movement.  The patient has been  evaluated also by Dr. Rito Ehrlich; evaluation reveals that elevation of the  bladder neck will result in improvement in the patient's bladder  functioning.   PAST MEDICAL HISTORY:  She has one prior vaginal delivery of a 9-plus-pound  infant.  She has arthritic knee pain bilaterally.  She denies a history of  hypertension or diabetes.   ALLERGIES:  SULFA, which gives her a rash.   CURRENT MEDICATIONS:  1. Lexapro 10 mg p.o. once daily (she takes at bedtime for depressive     symptoms)  2. Celebrex 400 mg p.o. once daily for arthritic knee pain  3. Lortab on a p.r.n. basis.   REVIEW OF SYSTEMS:  Pertinent for progressive weight gain, arthritic knee  pain, irregular menses, dysmenorrhea, pelvic pain with pressure.   PHYSICAL EXAMINATION:  VITAL SIGNS:  Height 5 feet 7 inches, 251, 120/78,  80, 20.  HEENT:  Negative.  No adenopathy.  Neck is supple.  Thyroid is nonpalpable.  LUNGS:  Clear.  CARDIOVASCULAR:  Regular rate and rhythm.  ABDOMEN:  Soft and nontender.  No surgical scars are identified.  No  palpable masses.  EXTREMITIES:  Normal.  PELVIC:  Normal  external genitalia.  Cervix noted to be without lesions but  it appears to have moderate descensus with the uterosacral ligaments present  at about the hymenal ring with gentle traction.  The uterus noted to normal  size, palpably has a retroflexed uterus.  The ovaries are nonpalpable but no  palpable adnexal masses are appreciated.  There is a cystocele identified  and a very large rectocele; no evidence of any enterocele.   ASSESSMENT AND PLAN:  The patient with symptoms of pelvic prolapse to  proceed with surgical therapy at this time consisting of a planned vaginal  hysterectomy.  She also desires removal of the ovaries bilaterally as she  does have a positive family history of ovarian cancer and experiences  episodic left adnexal-type pain.  Following vaginal hysterectomy, we will  attempt to proceed with a vaginal bilateral  salpingo-oophorectomy,  thereafter proceed with anterior repair and Dr. Rito Ehrlich will perform a TDT,  finally a posterior repair will be performed.  Discussed with the patient at  length removal of the ovaries.  She is adamant regarding her desire for  removal of the ovaries even if it requires proceeding to open laparotomy for  adnexal removal.  The patient is aware that this might be possible.  She is  also aware that removal of the ovaries will result in onset of surgical  menopause and this will be treated with prophylactic placement of a Climara  patch in the recovery room.  Risks and benefits of the proposed operative  procedure were discussed the patient and we are proceeding on 04/02/2002.                                               Langley Gauss, M.D.    DC/MEDQ  D:  04/02/2002  T:  04/02/2002  Job:  [id number]

## 2010-06-09 NOTE — Op Note (Signed)
NAME:  Debbie Pineda, Debbie Pineda                       ACCOUNT NO.:  [id number]   MEDICAL RECORD NO.:  [id number]                   PATIENT TYPE:  INP   LOCATION:  A327                                 FACILITY:  [facility]   PHYSICIAN:  Dennie Maizes, M.D.                DATE OF BIRTH:  Apr 11, 1960   DATE OF PROCEDURE:  04/02/2002  DATE OF DISCHARGE:                                 OPERATIVE REPORT   PREOPERATIVE DIAGNOSES:  1. Stress urinary incontinence.  2. Pelvic relaxation.   POSTOPERATIVE DIAGNOSES:  1. Stress urinary incontinence.  2. Pelvic relaxation.   PROCEDURE:  Transvaginal ureteral sling tape procedure using Uretex.   SURGEON:  Dennie Maizes, M.D.   ANESTHESIA:  General   COMPLICATIONS:  None.   ESTIMATED BLOOD LOSS:  Minimal.   DRAINS:  A 20 French Foley catheter in the bladder.   INDICATIONS FOR PROCEDURE:  This 50 year old female had troublesome stress  urinary incontinence associated with pelvic relaxation.  She was scheduled  to under vaginal hysterectomy, bilateral salpingo-oophorectomy, anterior and  posterior repair by Dr. Lisette Grinder.  I planed to do tension free transvaginal  tape ureteral sling procedure under the same anesthesia.   DESCRIPTION OF PROCEDURE:  General anesthesia was induced and the patient  was placed on the OR table in the high lithotomy position.  Dr. Lisette Grinder  proceeded with vaginal hysterectomy with bilateral salpingo-oophorectomy.  He made a __________ incision in the anterior vaginal wall to expose the  bladder.  I inserted a 30 French Foley catheter into the bladder and clear  urine was drained.  The 2 pubic tubercles were marked on the skin and two  points, about 1.5 cm lateral and above the pubic tubercles were marked.  About 10 mL of normal saline was injected on either side of the bladder neck  area for hydrodissection.   The mid urethra was then held with 2 Allis clamps and a small incision was  made.  This connected with the  incision being made by Dr. Lisette Grinder.  A  straight stylet was then inserted through the catheter and the bladder neck  was pushed to the left side.  The trocar carrying the blue-gray tube was  then inserted on the right side with digital guidance.  The trocar was  inserted behind the pubic ramus through the end double loop fascia to exit  through the previously marked point on the suprapubic area.   Cystoscopy was done.  There was a small area of entry of the trocar into the  bladder.  The trocar was withdrawn and reinserted with any difficulty.  Cystoscopy was repeated.  The trocar was found to be entirely outside the  bladder.  There was no bleeding. The trocar was then pulled out of the  suprapubic incision leaving the guide tube in place.  The trocar was  inserted on the left side in a similar fashion with digital guidance.  There  was no difficulty inserting the trocar on the left side.  Cystoscopy  confirmed that the trocars as well as the blue tips were outside the  bladder.  The Prolene mesh sling was then attached to the guide tips and  pulled through a suprapubic incision.  The bladder was filled with about 300  mL of water.  The tension of the sling was then adjusted to prevent urinary  leakage.  The Mayo scissors could be kept between the urethra and the sling  and the adjustment was made.   Dr. Lisette Grinder then proceed with completion of the cystocele repair.  A bite  was taken through the sling while  crossing the medial urethral incision to  prevent the sling from slipping backwards over the bladder.  There was no  active bleeding.  The plastic tubes were then removed.  The sling was  excised flush with the surface of the skin.  The suprapubic incision was  then closed using 3-0 Vicryl.  A 20 French Foley catheter was inserted into  the bladder and clear urine was drained.  Estimated blood loss for this  procedure was minimal.  Dr. Lisette Grinder then proceeded with posterior  repair.                                               Dennie Maizes, M.D.    SK/MEDQ  D:  04/02/2002  T:  04/02/2002  Job:  [id number]   cc:   Langley Gauss, M.D.  [address], [address]  [place]  [place] [address]  Fax: [phone]

## 2010-06-09 NOTE — Cardiovascular Report (Signed)
NAMEHAYLEY, Debbie Pineda             ACCOUNT NO.:  [id number]   MEDICAL RECORD NO.:  [id number]          PATIENT TYPE:  OUT   LOCATION:  RAD                           FACILITY:  [facility]   PHYSICIAN:  Noralyn Pick. Eden Emms, MD, FACCDATE OF BIRTH:  1960/02/16   DATE OF PROCEDURE:  DATE OF DISCHARGE:                              CARDIAC CATHETERIZATION   [name] is a 45-year patient with increasing lower extremity edema and  shortness of breath.  Left and right heart cath was done to rule out  pulmonary hypertension and assess filling pressures, as well as rule out  coronary disease.   Cine catheterization was done with 7-French right venous sheath and a 5-  French left femoral artery sheath.   Left main coronary artery was normal.   Left anterior descending artery was normal.   Circumflex coronary artery is normal.   Right coronary is dominant and normal.   RAO ventriculography:  RAO ventriculography was normal.  EF was 60%.  There  was no gradient across the aortic valve and no MR.   Aortic pressure is 116/74, LV pressure is 116 over 10.  There was no  significant post A-wave EDP.   Right heart catheterization showed no evidence of pulmonary hypertension,  despite the patient being on phentermine.  Mean right atrial pressure was  13.  RV pressure was 30/9.  PA pressure was 30/15 with a mean of 21.  Mean  pulmonary capillary wedge pressure was 12.   IMPRESSION:  The patient has no evidence of pulmonary hypertension.  Her  filling pressures were normal.  There is no significant coronary artery  disease.  She has normal LV function.   Her shortness of breath would appear to be secondary to her weight to and/or  a pulmonary process.  She did have some bronchitic changes on her chest x-  ray.   She will follow with Dr. Regino Schultze for this.  Her lower extremity edema would  appear to be secondary to her weight and also venous insufficiency with  previous vein stripping.   There is  no evidence of significant cardiopulmonary disease.           ______________________________  Noralyn Pick. Eden Emms, MD, [facility]     PCN/MEDQ  D:  11/30/2005  T:  11/30/2005  Job:  [id number]   cc:   Kirk Ruths, M.D.  [facility] Cardiology Office - [place]

## 2010-06-09 NOTE — Op Note (Signed)
[name], [name]             ACCOUNT NO.:  [id number]   MEDICAL RECORD NO.:  [id number]          PATIENT TYPE:  AMB   LOCATION:  DAY                           FACILITY:  [facility]   PHYSICIAN:  Lionel December, M.D.    DATE OF BIRTH:  1960-11-09   DATE OF PROCEDURE:  04/06/2005  DATE OF DISCHARGE:                                 OPERATIVE REPORT   PROCEDURE:  Esophagogastroduodenoscopy.   INDICATIONS:  Debbie Pineda is a 50 year old Caucasian female with several-month  history of epigastric pain who has had partial response with double-dose  PPI. She has been treated for H pylori gastritis in the past. She has had  negative ultrasound and hepatobiliary scan. She had recent abdominal pelvic  CT which was unremarkable. She is undergoing diagnostic EGD. Procedure and  risks were reviewed with the patient, and informed consent was obtained.   MEDICATIONS FOR CONSCIOUS SEDATION:  Cetacaine spray for pharyngeal topical  anesthesia, Demerol 50 mg IV, Versed 8 mg IV.   FINDINGS:  Procedure performed in endoscopy suite. The patient's vital signs  and O2 saturation were monitored during the procedure and remained stable.  The patient was placed in left lateral position, and Olympus videoscope was  passed via oropharynx without any difficulty into esophagus.   Esophagus. Mucosa of the esophagus was normal throughout. There was focal  erythema at GE junction on the gastric side. GE junction was at 40 cm from  the incisors. No hernia was apparent on today's exam. She has history of  small hernia seen on previous EGD of October 2004.   Stomach. It was empty and distended very well with insufflation. Folds of  proximal stomach were normal. Examination of mucosa revealed patchy/linear  erythema at antrum but no erosions or ulcers were noted. Angularis, fundus  and cardia were examined by retroflexing the scope and were normal.   Duodenum. Bulbar mucosa revealed focal erythema but no erosions or ulcers  were noted. Scope was passed to the second part of duodenum where mucosa and  folds were normal. Endoscope was withdrawn. The patient tolerated the  procedure well.   FINAL DIAGNOSIS:  Mild changes of reflux esophagitis limited to GE junction.   Nonerosive antral gastritis and bulbar duodenitis.   I doubt that gastroduodenitis would explain her pain. Suspect this is  secondary to NSAID therapy.   Note that she is status post treatment for H pylori gastritis in the past.   RECOMMENDATIONS:  1.  Will leave her on Prevacid 30 mg b.i.d. for another 3 months.  2.  Discontinue Levsin SL and trial with dicyclomine 10 to 20 mg t.i.d.      p.r.n.; prescription given for 60 with a refill.  3.  She will return for OV in three months from now unless her pain was to      get worse again.      Lionel December, M.D.  Electronically Signed     NR/MEDQ  D:  04/06/2005  T:  04/07/2005  Job:  [id number]

## 2010-06-09 NOTE — H&P (Signed)
NAME:  Debbie Pineda, Debbie Pineda             ACCOUNT NO.:  [id number]   MEDICAL RECORD NO.:  [id number]          PATIENT TYPE:  AMB   LOCATION:                                FACILITY:  [facility]   PHYSICIAN:  Dalia Heading, M.D.  DATE OF BIRTH:  11/13/1960   DATE OF ADMISSION:  DATE OF DISCHARGE:  LH                                HISTORY & PHYSICAL   CHIEF COMPLAINT:  Chronic cholelithiasis, fatty liver.   HISTORY OF PRESENT ILLNESS:  Patient is a 50 year old white female who is  referred for evaluation and treatment of biliary colic secondary to chronic  cholecystitis.  She has been having right upper quadrant abdominal pain and  bloating for many years.  She does have some fatty food intolerance.  No  fever, chills, or jaundice have been noted.   PAST MEDICAL HISTORY:  Includes hypothyroidism, reflux disease,  fibromyalgia, arthritis.   PAST SURGICAL HISTORY:  Vaginal hysterectomy, right knee surgery, vein  sclerosing.   CURRENT MEDICATIONS:  Lyrica, Strattera, Cymbalta, Prevacid, hydrocodone,  Lasix, Ambien, Xanax, Armour thyroid supplements.   ALLERGIES:  SULFA, which causes itching.  MORPHINE, which causes itching.   REVIEW OF SYSTEMS:  Patient denies smoking.  She drinks alcohol socially.   PHYSICAL EXAMINATION:  GENERAL:  Patient is an overweight white female in no  acute distress.  HEENT:  No scleral icterus.  LUNGS:  Clear to auscultation with equal breath sounds bilaterally.  HEART:  Regular rate and rhythm without S3, S4, or murmurs.  ABDOMEN:  Soft with tenderness noted in the right upper quadrant to  palpation.  No hepatosplenomegaly, masses, or hernias are noted.   Ultrasound of the gallbladder is negative except for a fatty liver.   HIDA scan reveals chronic cholecystitis with a low gallbladder ejection  fraction.   IMPRESSION:  Chronic cholecystitis.   PLAN:  Patient is scheduled for a laparoscopic cholecystectomy with liver  biopsy on November 12, 2005.  The risks  and benefits of the procedure,  including bleeding, infection, hepatobiliary injury, and the possibility of  an open procedure were fully explained to the patient, who gave informed  consent.      Dalia Heading, M.D.  Electronically Signed     MAJ/MEDQ  D:  10/09/2005  T:  10/09/2005  Job:  [id number]   cc:   [facility]  Fax: [phone]   Lionel December, M.D.  [address]  [place]  [place] [address]   Kirk Ruths, M.D.  Fax: [phone]

## 2010-06-09 NOTE — Op Note (Signed)
NAME:  Debbie Pineda, Debbie Pineda                       ACCOUNT NO.:  [id number]   MEDICAL RECORD NO.:  [id number]                   PATIENT TYPE:  AMB   LOCATION:  DAY                                  FACILITY:  [facility]   PHYSICIAN:  Lionel December, M.D.                 DATE OF BIRTH:  Apr 15, 1960   DATE OF PROCEDURE:  11/10/2002  DATE OF DISCHARGE:                                 OPERATIVE REPORT   PROCEDURE:  Esophagogastroduodenoscopy.   ENDOSCOPIST:  Lionel December, M.D.   INDICATION:  Foster is a 50 year old Caucasian female with 5-6 month history  of epigastric right upper quadrant pain whose ultrasound and LFTs are  normal, and hepatobiliary scan also revealed EF 50%.  She was begun on PPI  and feels somewhat better.  She is undergoing diagnostic EGD.  The procedure  and risks were reviewed with the patient.  Informed consent was obtained.   PREOPERATIVE MEDICATIONS:  1. Cetacaine spray for oropharyngeal topical anesthesia.  2. Demerol 25 mg IV.  3. Versed 8 mg IV in divided dose.   DESCRIPTION OF PROCEDURE:  The procedure was performed in the endoscopy  suite.  The patient's vital signs and O2 saturations were monitored during  the procedure and remained stable.  The patient was placed in the left  lateral decubitus position, and the Olympus video endoscope was passed via  the oropharynx without any difficulty into the esophagus.   FINDINGS:  ESOPHAGUS:  The mucosa of the esophagus was normal.  There was  focal erythema at the GE junction which was somewhat serrated.  There was a  3 cm size sliding hiatal hernia.   STOMACH:  It was empty and distended very well with insufflation.  Folds of  the proximal stomach are normal.  Examination of the mucosa revealed patchy  erythema at antrum with two tiny prepyloric erosions.  The pyloric channel  was patent.  The angularis, fundus, and cardia were examined by retroflexion  of the scope and were normal.   DUODENUM:  Examination of the  bulb revealed normal mucosa.  The folds and  mucosa in the second part of the duodenum were normal.  The endoscope was  withdrawn.   The patient tolerated the procedure well.   FINAL DIAGNOSES:  1. Small sliding hiatal hernia with mild changes of reflux esophagitis at     the gastroesophageal junction.  2. Erosive antral gastritis.  No evidence of peptic ulcer disease.   RECOMMENDATIONS:  1. H. Pylori serology will be checked today.  2. She will continue Prilosec 30 mg p.o. q.a.m. and antireflux measures.   I would like her to keep a record as to the frequency of these episodes of  pain.  If she does have these episodes, she will try Levsin/SL q.i.d. p.r.n.  I will be contacting the patient with results of blood tests and further  recommendations.      ___________________________________________  Lionel December, M.D.   NR/MEDQ  D:  11/10/2002  T:  11/10/2002  Job:  [id number]   cc:   Blair Promise, MD  [facility]

## 2010-06-09 NOTE — H&P (Signed)
NAMEWEDA, Debbie Pineda             ACCOUNT NO.:  [id number]   MEDICAL RECORD NO.:  [id number]           PATIENT TYPE:   LOCATION:                                 FACILITY:   PHYSICIAN:  Lionel December, M.D.    DATE OF BIRTH:  1960/02/17   DATE OF ADMISSION:  DATE OF DISCHARGE:  LH                                HISTORY & PHYSICAL   PRESENTING COMPLAINT:  Epigastric pain of six months' duration.   Debbie Pineda is a 50 year old Caucasian female, patient of Dr. Yetta Numbers, who is  here for scheduled visit. She states for the last three months she has been  experiencing epigastric pain. She was initially seen in October 2004 when  she was having epigastric pain mainly due to right of midline. She had an  EGD which revealed a small sliding hiatal hernia and Helicobacter pylori  gastritis, and she was treated with Prevpac. At that time, her ultrasound  was negative for cholelithiasis, and HIDA scan revealed EF of 51%. She  improved with PPI. She stopped Prevacid. For the last six months, she has  been having pain across her upper abdomen. At times, it is more on the left;  at other times, it is more on the right. Pain at times has been very intense  and doubled her over. Not eating would make the pain worse as would certain  salads. She has noted a lot of burning, but she denies heartburn, nausea,  vomiting, melena or rectal bleeding. Her bowels usually move regularly. She  states she wakes up with sacral discomfort which is almost always relieved  with defecation. She is not having any lower abdominal pain. Her appetite is  good. She has gained 14 pounds in the last 28 months. She felt weight gain  was due to fluid. She is being weaned off of her Lyrica, and Relafen dose  has also been decreased. She had one prescription left of her Prevacid. She  started a few weeks ago, and she feels 50% better. She was given three  Hemoccults by Dr. Regino Schultze. She has not done them yet. She states at times  pain is noticed when she stands up suddenly. If she stoops forward, pain is  more noticeable. However, if she supports her upper abdomen, pain tends to  decrease.   MEDICATIONS:  1.  Prevacid 30 mg b.i.d.,  2.  Cymbalta 30 mg daily.  3.  Lasix 40 to 80 mg daily.  4.  Lyrica 75 mg t.i.d.  5.  Phentermine 37.5 mg daily p.r.n.  6.  Adderall XL 30 mg daily.  7.  Nabumetone 750 mg b.i.d.  8.  Thyroid extract 90 mg daily.  9.  Aspirin 81 mg daily.  10. Vitamin C 1 g daily.  11. Albuterol inhaler 2 puffs q.i.d. p.r.n.  12. NyQuil.  13. Sudafed.  14. Robitussin p.r.n.   PAST MEDICAL HISTORY:  1.  She has ADD.  2.  Problems with early edema.  3.  Bronchitis.  4.  Fibromyalgia.  5.  Obesity.  6.  Hypothyroidism was diagnosed two years ago.  7.  Her GI workup of 2004 is reviewed above. She is status post therapy for      Helicobacter pylori gastritis.   PAST SURGICAL HISTORY:  1.  Right knee arthroscopy with allograft for ACL tear in January 2006.  2.  She is status post hysterectomy in March 2004.  3.  She also has had ____________ right leg varicose veins.   ALLERGIES:  MORPHINE, SULFA and BACTRIM.   FAMILY HISTORY:  Noncontributory. She has sister and two brothers. One  brother has Crohn's disease.   SOCIAL HISTORY:  She is married. She has one daughter. She is presently  working at [employer]. She quit cigarette smoking a few years ago,  and she drinks two to three drinks of alcohol a month.   PHYSICAL EXAMINATION:  VITAL SIGNS:  Weight 271.5 pounds. She is 5 feet 6  inches tall. Pulse 60 per minute. Blood pressure 118/80. Temperature is  98.1.  HEENT:  Conjunctiva is pink. Sclera is nonicteric. Oropharyngeal mucosa is  normal.  NECK:  No neck masses or thyromegaly noted.  CARDIAC:  Regular rate and rhythm. Normal S1 and S2. No murmur or gallop  noted.  LUNGS:  Clear to auscultation.  ABDOMEN:  Her abdomen is obese. Bowel sounds are normal. She has superficial   tenderness along the right costal margin. She also has tenderness and deep  palpation in mid epigastrium. No organomegaly or masses noted.  RECTAL:  Deferred. She does not have clubbing or peripheral edema.   LABORATORY DATA:  From March 14, 2005, WBC 6.0, H and H is 13.3 and 40,  platelet count 280,000. Bilirubin 0.3, AP 101, AST 22, ALT 26, total protein  7.6 with albumin of 4.4, calcium 9.1. Her TSH was 1.975, and hemoglobin A1c  was 5.6.   ASSESSMENT:  [name] is a 50 year old Caucasian female who presents with  recurrent abdominal pain across her upper abdomen. She is 50% better with  Prevacid. She is on Relafen plus low-dose aspirin. She is therefore at risk  for peptic ulcer disease. However, I feel she also has musculoskeletal pain  probably related to her fibromyalgia. If PUD has been ruled out and pancreas  and biliary tract are normal, she may eventually end up with EUS or biliary  manometry.   PLAN:  1.  She will continue Prevacid 30 mg p.o. b.i.d.; prescription given for 60      with 3 refills.  2.  Levsin SL 1 to 2 t.i.d. p.r.n. when she has sharp pain.  3.  Abdominal CT with oral and IV contrast. If CT is normal, she will      undergone esophagogastroduodenoscopy.      Lionel December, M.D.  Electronically Signed     NR/MEDQ  D:  03/22/2005  T:  03/22/2005  Job:  [id number]   cc:   [facility]  Fax: [phone]   Kirk Ruths, M.D.  Fax: [phone]

## 2010-06-09 NOTE — Assessment & Plan Note (Signed)
[facility]                         [place] CARDIOLOGY OFFICE NOTE   Debbie Pineda, Debbie Pineda                    MRN:          [id number]  DATE:12/05/2005                            DOB:          11/03/1960    Debbie Pineda returns today for followup.  She is a 51 year old patient I initially  saw at the request of Dr. Regino Schultze.  She had increasing lower extremity  edema, increasing shortness of breath, and had been on phentermine.  We did  a right and left heart cath on her.  She had normal coronaries.  No evidence  of pulmonary hypertension, normal filling pressures, normal right-sided  pressures.  She has developed some right lower quadrant pain since her heart  cath.  CT scan today showed a small fluid collection in the right inguinal  area, adjacent to the right femoral vein.  I suspect that she had a small  venous bleed.   She will have a followup hemoglobin and crit today.  Her baseline hematocrit  prior to her cath was 37.   She also had a venous duplex, which showed no evidence of DVT.  I told Debbie Pineda  that I felt a lot of her symptoms were secondary to her weight.  She has  some chronic gallbladder problems that need to be resolved.  From a cardiac  perspective, she is cleared to have laparoscopic surgery if she needs it.  However, she may need to further consider bariatric surgery.   I told her to followup with Dr. Regino Schultze in regards to her dyspnea, as there  is no evidence of phentermine-induced pulmonary hypertension, no evidence of  LV dysfunction, and her filling pressures are all normal by cath.  From our  standpoint, we will follow her groin closely.  We will do a hemoglobin and  crit today.  She will have a followup CT scan in a week.  I spoke personally  with Dr. Anselmo Pickler in radiology, and he thought this would be best.   EXAM:  She is not in much distress.  The blood pressure is stable.  She is not hypotensive at 110/78, which  is  the same blood pressure she had on November 23, 2005.  Pulses 78 and regular.  HEENT:  Normal.  There is no lymphadenopathy.  No thyromegaly.  There are no carotid bruits.  LUNGS:  Clear.  Carotid heart sounds are normal.  ABDOMEN:  Obese.  There is some mild tenderness in the right lower quadrant  with no hematoma.  There is no rebound.  The puncture site itself is healed  well and there is no bruit to suspect fistula formation.  Abdomen is benign.  She has +1 lower extremity edema bilaterally with palpable pulses.   IMPRESSION:  Stable lower extremity edema.  Continue Lasix 40 b.i.d.  No  evidence of venous deep vein thrombosis or reflux.  Normal right-sided heart  pressures.  No evidence of pulmonary  hypertension.  Followup with Dr. Regino Schultze in regards to shortness of breath  and possible  pulmonary function tests.  Hemoglobin and hematocrit today with followup CT  to follow  up small venous hematoma and fluid collection at cath site.     Debbie Pick. Eden Emms, MD, [facility]  Electronically Signed    PCN/MedQ  DD: 12/05/2005  DT: 12/05/2005  Job #: [id number]

## 2010-06-09 NOTE — Procedures (Signed)
Debbie Pineda, Debbie Pineda             ACCOUNT NO.:  [id number]   MEDICAL RECORD NO.:  [id number]          PATIENT TYPE:  OUT   LOCATION:  RAD                           FACILITY:  [facility]   PHYSICIAN:  Nephi Bing, M.D. [facility] OF BIRTH:  1960/04/20   DATE OF PROCEDURE:  03/29/2005  DATE OF DISCHARGE:                                  ECHOCARDIOGRAM   REFERRING:  Dr. Regino Schultze and Dr. Dorethea Clan.   CLINICAL DATA:  A 50 year old woman with palpitations.   M-MODE:  Aorta 3.2, left atrium 3.9, septum 1.4, posterior wall 1.3, LV  diastole 4.4, LV systole 2.8.   1.  Technically adequate echocardiographic study.  2.  Normal left atrial and right atrial size. The right ventricle is      prominent but not frankly enlarged. No RVH. Normal RV function.  3.  Normal trileaflet aortic valve; minimal annular calcification.  4.  Normal pulmonic valve and proximal pulmonary artery.  5.  Normal tricuspid valve.  6.  Normal mitral valve; mild mitral annular calcification.  7.  Normal left ventricular size; no LVH; normal regional and global      function.  8.  Normal IVC.      [place] Bing, M.D. [facility]  Electronically Signed     RR/MEDQ  D:  03/29/2005  T:  03/30/2005  Job:  [id number]

## 2010-06-09 NOTE — Consult Note (Signed)
NAME:  Debbie Pineda                       ACCOUNT NO.:  [id number]   MEDICAL RECORD NO.:  [id number]                   PATIENT TYPE:   LOCATION:                                       FACILITY:   PHYSICIAN:  Lionel December, M.D.                 DATE OF BIRTH:  02/09/1960   DATE OF CONSULTATION:  11/02/2002  DATE OF DISCHARGE:                                   CONSULTATION   REQUESTING PHYSICIAN:  Dr. Laveda Abbe.   REASON FOR CONSULTATION:  Right upper quadrant abdominal pain.   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 Laveda Abbe, who presents today for  further evaluation of five-month history of right upper quadrant abdominal  pain. She started having pain intermittently about months ago, but this has  progressively worsened now, occurs daily. In August, she has severe pain and  presented to the emergency department. LFTs at that time were normal. She  has had a workup including chest x-ray which was negative. Abdominal  ultrasound was negative. A MR abdomen in August was also unremarkable. The  patient states that she is having symptoms post prandially, specifically  with certain foods including vegetables and orange juice. She denies any  typical heartburn symptoms. She noted some increase pain, especially with  bending over. The pain radiates around the right flank into her right back.  Denies any associated nausea or vomiting. No change in bowel habits. She has  chronically had three to five formed bowel movements daily. Denies any  melena or rectal bleeding. Denies any weight loss, change in appetite. She  consumes 800 to 1,600 of ibuprofen daily and has done so for several months.  She takes this for degenerative joint disease of the knees. She also  consumes approximately six beers weekly.   CURRENT MEDICATIONS:  Ibuprofen 800 mg one to two tablets daily p.r.n.   ALLERGIES:  SULFA causes  rash.   PAST MEDICAL HISTORY:  1. Degenerative joint disease of the knees.  2. Degenerative disk disease.   PAST SURGICAL HISTORY:  1. Hysterectomy in March 2004.  2. Right leg varicose vein stripping.   FAMILY HISTORY:  Significant for diabetes in father, brother, and sister.  She has a brother with Crohn's disease. No family history of colorectal  cancer.   SOCIAL HISTORY:  She has been married for 22 years. Has one daughter. 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 She quit smoking. She denies any  tobacco use; however, smoked for a few years during her teenage years. She  consumes approximately six beers weekly.   REVIEW OF SYSTEMS:  Please see HPI for GI and for general. CARDIOPULMONARY:  Denies any chest pain or shortness of breath.   PHYSICAL EXAMINATION:  VITAL SIGNS:  Weight 257, height 5 foot 7,  temperature 97.7, blood pressure 100/78, pulse 74.  GENERAL:  Pleasant, well-developed, well-nourished, Caucasian female in no  acute distress.  SKIN:  Warm and dry with no jaundice.  HEENT:  Conjunctivae are pink. Sclerae nonicteric. Oropharyngeal mucosa  moist and pink. No lesions, erythema, or exudate. No lymphadenopathy or  thyromegaly.  CHEST:  Lungs clear to auscultation.  CARDIAC:  Reveals regular rate and rhythm. Normal S1 and S2. No murmurs,  rubs, or gallops.  ABDOMEN:  Positive bowel sounds, soft, nondistended. She has mild tenderness  just right of the epigastrium to deep palpation. No organomegaly or masses.  EXTREMITIES:  No edema.   IMPRESSION:  [name] is a pleasant 50 year old lady with five-month history of  epigastric/right upper quadrant abdominal pain which typically occurs post  prandially. Workup thus far includes an unremarkable chest x-ray, abdominal  ultrasound, and MR abdomen. We cannot rule out biliary dyskinesia. In  addition, given her significant ibuprofen use, we would be concerned about  peptic ulcer disease.   PLAN:  1. Go ahead  and schedule her for an upper endoscopy. In the meantime, we     will obtain a HIDA scan with CCK challenge. Will begin her on Prevacid 30     mg daily, #30 samples given.  2. Obtain LFTs.  3. Further recommendations to follow.     ________________________________________  ___________________________________________  Tana Coast, P.A.                         Lionel December, M.D.   LL/MEDQ  D:  11/02/2002  T:  11/02/2002  Job:  [id number]   cc:   Laveda Abbe, M.D.

## 2010-06-09 NOTE — H&P (Signed)
[facility] ADMISSION   Debbie Pineda, Debbie Pineda                    MRN:          [id number]  DATE:11/23/2005                            DOB:          1960-10-24    Ms. Steelman is seen today as a new consult by me.  She was referred back  by Dr. Regino Schultze.  She has been having increasing shortness of breath and  lower extremity edema.   The patient is approximately 2 weeks status post laparoscopic  cholecystectomy with liver biopsy.  She has recovered well from this.  She  has had long-standing epigastric and chest pain.  Apparently, there has not  been a history of gallstones but Dr. Karilyn Cota did some sort of intraesophageal  ultrasound and showed sludge.  The patient has recovered well from this.  However, she continues to have increasing lower extremity edema and  shortness of breath.  There was no history of asthma, COPD, or smoking.  She  has had some PND and orthopnea.  There is no history of PE or lower  extremity DVT.  The patient has been on increasing dosage of Lasix.  She is  hypothyroid but is on replacement therapy.  The patient saw Dr. Dorethea Clan back  in March.  At the time she was primarily complaining of palpitations,  shortness of breath, and lower extremity edema.  She had a 2D echocardiogram  which essentially showed normal LV function with no significant valve  disease, no evidence of pulmonary hypertension.  At the time, she apparently  had a positive sleep study and a chest x-ray which showed mild bronchitic  changes.   In talking to the patient, she is a Engineer, civil (consulting).  She is concerned about the  increasing dosage of medications that are required to treat her lower  extremity edema.  Note, should be made that she is also on Phentermine.  She  has been on it for a few months.  She has currently been taking it  sporadically.  I explained to her that this is part of the phen-phen  combination and has  been associated with pulmonary hypertension.  I told her  I would rather have her entertain bariatric surgery then take appetite  suppressants.   1. THE PATIENT IS ALLERGIC TO MORPHINE WHICH CAUSES ITCHINESS.  2. SHE IS ALSO ALLERGIC TO SULFA.  3. BACTRIM.   FAMILY HISTORY:  Remarkable for diabetes on her mother's side.   The patient works as a Patent examiner.  She has been inactive lately  partly due to her gallbladder problems and partly due to her increasing  lower extremity edema.  She does not smoke.  She does not drink excessively.   MEDICATIONS:  1. Lyrica 75 t.i.d.  2. Aspirin a day.  3. Lortab 70/500.  4. Phentermine 7.5, half tab a day.  5. Lasix 40 b.i.d.  6. Cymbalta 60 a day.  7. __________  once a day.  8. Folic acid 1 mg a day.   PHYSICAL EXAMINATION:  GENERAL:  She is overweight.  SKIN:  Warm and  dry.  HEENT:  Normal.  There is no thyromegaly.  There is no lymphadenopathy.  JVP  is not visible.  LUNGS:  Clear.  There was no wheezing.  HEART:  There is an S1 S2 with distant heart sounds.  ABDOMEN:  Benign.  She is status post lap chole.  EXTREMITIES:  Distal pulses are intact.  She has +1 edema bilaterally.  There are no cords and no evidence of chronic venous insufficiency.  PTs are  palpable bilaterally.   Her EKG is normal with no evidence of cor pulmonale and no evidence of  previous infarct.   IMPRESSION:  Essentially, we have an overweight young female with increasing  symptoms of edema and dyspnea.   The patient has been on phentermine which has been associated with pulmonary  hypertension.  She had a benign echocardiogram in March.  Given the  patient's positive sleep study, use of phentermine, and increasing lower  extremity edema and dyspnea, I think it is reasonable to proceed with a  right and left heart cath.   The patient is concerned about her symptoms and escalating dosage of Lasix.  I think it is important, particularly if she is to  entertain bariatric  surgery in the future to make sure that her pulmonary pressures and coronary  arteries are fine with no evidence of structural heart disease.   If the patient's right and left heart cath is normal, she will then follow  up with a venous Duplex of the lower extremities to rule out venous  insufficiency.  I suspect that some of her lower extremity edema is actually  lymph edema.  Fortunately, the patient is currently apparently not a type 2  diabetic.  I explained to her that I am not a fan of appetite suppressants  and my own bias would be to stop the phentermine and have her entertain lap  banding or some other fairly noninvasive bariatric surgery.   For the time being she will continue her current dose of Lasix 40 b.i.d. We  will do pre-cath labs including a BNP to assess her filling status.  Further  recommendations will be based on the results of her right and left heart  cath.   The risk of catheterization including stroke, bleeding, need for vascular  repair, contrast allergy were discussed.  She is willing to proceed.    ______________________________  Noralyn Pick Eden Emms, MD, [facility]    PCN/MedQ  DD: 11/23/2005  DT: 11/23/2005  Job #: [id number]   cc:   Kirk Ruths, M.D.

## 2010-06-09 NOTE — Op Note (Signed)
NAME:  Debbie Pineda, Debbie Pineda                       ACCOUNT NO.:  [id number]   MEDICAL RECORD NO.:  [id number]                   PATIENT TYPE:  INP   LOCATION:  A327                                 FACILITY:  [facility]   PHYSICIAN:  Langley Gauss, M.D.                DATE OF BIRTH:  1960/12/10   DATE OF PROCEDURE:  04/02/2002  DATE OF DISCHARGE:                                 OPERATIVE REPORT   PREOPERATIVE DIAGNOSES:  1. Pelvic prolapse with uterine prolapse.  2. Symptomatic rectocele with resultant constipation.  3. Symptomatic cystocele with resultant genuine stress urinary incontinence.  4. Left adnexal pain.   POSTOPERATIVE DIAGNOSES:  1. Pelvic prolapse with uterine prolapse.  2. Symptomatic rectocele with resultant constipation.  3. Symptomatic cystocele with resultant genuine stress urinary incontinence.  4. Left adnexal pain.   PROCEDURE:  1. Vaginal hysterectomy with bilateral salpingo-oophorectomy.  2. Anterior repair with tension-free vaginal tape procedure performed by Dr.     Rito Ehrlich.  3. Repair of rectocele.   SURGEON:  Primary surgeon:  Dr. Roylene Reason. Lisette Grinder  TVT procedure performed by Dr. Rito Ehrlich.   ANESTHESIA:  General endotracheal   ESTIMATED BLOOD LOSS:  600 mL   SPECIMENS:  For permanent section only.   DRAINS:  A Foley catheter is left within the drain postoperatively in  addition to vaginal packing saturated with MetroGel was placed  intravaginally.   FINDINGS AT THE TIME OF SURGERY:  Include a very large rectocele. No  enterocele identified.  The cystocele, itself, was only a first degree  cystocele, but the urethra is elevated by the TVT procedure.  The technical  difficulty of the operative procedure is complicated by patient's complaint  of some low sacral type pain as well as some arthritic knee pain.  Thus,  rather than being placed in the candy cane stirrups for maximum exposure she  is rather placed in the low lithotomy, laparoscopic  stirrups.   DESCRIPTION OF PROCEDURE:  The patient was taken to the operating room.  Vital signs were stable.  The patient underwent uncomplicated induction of  general endotracheal anesthesia after which time she was carefully placed in  the low lithotomy position.  She was prepped and draped in the usual sterile  manner; prepped with a Betadine solution.  A short weighted speculum placed  within the vaginal vault.  The anterior and posterior lip of the cervix was  grasped with a thyroid tenaculum.  Traction then resulted in the descent of  the leading, most portion of the uterosacral ligaments to the hymenal ring.   A total of 20 mL of 0.5% bupivacaine with epinephrine was then injected  circumferentially around the cervix to raised small wheals beneath the  mucosa.  The uterus is then retracted anteriorly.  The cul-de-sac is  identified.  A large Mayo scissors was used to atraumatically enter into the  posterior cul-de-sac with findings of clear peritoneal fluid.  A suture is  placed within the posterior peritoneum as well as the posterior vaginal  mucosa and tagged for later inspection.   The uterosacral ligaments are then ligated initially.  First the right  followed by the left, clamped with a curved Heaney clamp followed by suture  ligature of #0 Vicryl in a Heaney fashion which is tagged for later  inspection.  The uterus is then retracted posteriorly at which time I used a  sharp knife to dissect down to the vaginal mucosa, down to about the level  of the bladder peritoneum which was then bluntly mobilized up towards the  uterine fundus in an effort to move the bladder out of the present surgical  site.  On the right and then on the left a curved Zeppelin clamp is placed  around the cardinal ligament followed by suture ligature of #0 Vicryl in a  Heaney fashion.  This then brought me to the level of the uterine arteries  bilaterally.  The right uterine artery is clamped first  with a curved  Zeppelin clamp followed by suture ligature of #0 Vicryl in a Heaney fashion.  The left uterine vessel clamped second with a curved Zeppelin followed by #0  Vicryl in a Heaney fashion.   With the major vascular supply controlled, I now turned by attention back to  the bladder flap.  I was able to visualize the bladder reflection on the  lower uterine segment.  Utilizing dissection with the Metzenbaum scissors, I  was able to dissect down beneath the bladder flap and create a bladder flap  from the vesicouterine fold which then allows this to be elevated over the  uterine fundus utilizing the curved Deaver retractor. With the bladder now  mobilized out of the operative field the anterior cul-de-sac is noted to  have been entered.   This then brought me up to the level of the uteroovarian as well as the  round ligament and fallopian tubes bilaterally.  The right round ligament  and right fallopian tube are grasped first with a curved Zeppelin clamp  followed by 2 ties; first with a free tie of #0 Vicryl followed by a suture  ligature of #0 Vicryl in a Heaney fashion which was then tagged.  On the  patient's other side, likewise a curved Zeppelin clamp was placed followed  by a suture ligature of #0 Vicryl after placing a free-tie of #0 Vicryl.  Each of the utero-ovarian ligaments is then identified and each is clamped  separately with curved Zeppelin clamps followed by separate ligation with  suture ligature of #0 Vicryl in a Heaney fashion.  This then allows complete  removal of the operative specimen which is examined and noted to be the  entirety of the cervix as well as the uterine fundus itself.   I then identified and examined the vaginal cuff.  I was able to easily see  and manipulate the right ovary which is noted to have a 2 cm functional cyst  which is noted to rupture during its manipulation.  A Babcock clamp is used to grasp the right ovary which is then  retracted slightly.  The  infundibulopelvic ligament is then identified after clamping with a curved  Zeppelin clamp a free-tie of #0 Vicryl was placed around the right  infundibulopelvic ligament and then transected.  The left ovary is more  difficult to identify; however, by utilizing a sponge stick I was able to  mobilize bowel out of the operative  field. I identified the left ovary which  was then grasped with a Babcock clamp.  Gentle retraction was then performed  which results in identification of the infundibulopelvic ligament and after  clamping with a curved Zeppelin clamp a free-tie of #0 Vicryl was placed to  secure its hemostasis on the left.  Each of the ovaries is then removed in  its entirety and examined and noted to be entirely included in our  specimens.   At this point in time our sponge and instrument counts are correct x2 at  this point.  Due to some bleeding in the posterior vaginal cuff I did close  the posterior vaginal cuff, at this point, with a continuous running 2-0  Vicryl suture in a running lock fashion extending from 1 uterosacral  ligament to the other.  Dr. Rito Ehrlich is now notified of our current position  in the surgical procedure.  I started performing the anterior repair by  grasping the anterior vaginal mucosa with Allis clamps, gently retracting  which then allows me to dissect in the avascular plane between the vaginal  mucosa and the underlying vesicovaginal fascia.  This is continued to near  the level of the urethra which then allows me to dissect laterally on each  side in the avascular plane just beneath the vaginal mucosa.  With the  lateral dissection completed, Dr. Rito Ehrlich then performed a TVT procedure.  I did assist him with this procedure.   After performance of the TVT, Dr. Rito Ehrlich assisted while I closed the  anterior vaginal wall.  The anterior repair was closed by utilizing a total  of 4 vertical mattress sutures of #0 Vicryl  through the vesicovaginal fascia  with redundant fascia being oversewn in the midline.  The vaginal mucosa is  then closed with a continuous running #0 Vicryl suture to result in secure  closure.   Following this, a bivalve speculum was placed which allows visualization of  the vaginal cuff.  The vaginal cuff, at this point in time, appears to be  free with good hemostasis and no active bleeding identified.  The vaginal  cuff is then closed utilizing a continuous running locked #0 Vicryl suture  going from anterior to posterior and reapproximating the vaginal mucosa in  the midline.  All bleeders are controlled.  Hemostasis assured.  I then  proceeded with the posterior repair.  At the level of the posterior  vestibule laterally Allis clamps were placed which then allows me to utilize  a sharp knife to transect between the two and a triangle-shaped wedge of  skin is removed from the perineal body.  This then allows me to transect and dissect in the avascular plane between the posterior vaginal mucosa and the  tuborectal fascia.  This is continue to near the level of the vaginal apex  and then the dissection is continued laterally with dissection of the  posterior vaginal mucosa off the underlying fascial layer laterally and then  atraumatically.  This then allows me to reduce and repair the rectocele  posteriorly by placing a series of 8 vertical mattress sutures of #0 Vicryl  with oversewing of redundant puborectal fascia in the midline and reduction  of a large rectocele.   The overlying vaginal mucosa is closed with a continuous running #0 Vicryl  suture.  The perineal body is then repaired by identifying the superficial  transverse perineal muscle which is then reduced and sutured in place by  placing lateral vertical mattress sutures and  bringing them together in the  midline with oversewing of redundant fascia.  This results in a narrowing of  the vaginal opening and restoration of  what is more seemingly the normal  initial anatomy.  The mucosa and overlying skin of the posterior vagina as  well as the perineal body is then closed with a #0 Vicryl suture followed by  a continuous running #0 Vicryl suture on the perineal body.  This results in  much improved support of the anterior and posterior vaginal mucosa. A pack  was then placed by placing a single packing of material saturated with a  MetroGel solution.   The patient, at this point in time, continues to drain clear yellow urine.  The packing is placed without difficulty.  There is no evidence of any  active bleeding ongoing.  The patient's vital signs remain stable.  Thus she  is reversed of anesthesia and taken to the recovery room in stable  condition, at which time operative findings are discussed with the patient's  awaiting family.                                               Langley Gauss, M.D.    DC/MEDQ  D:  04/02/2002  T:  04/03/2002  Job:  [id number]

## 2010-06-09 NOTE — Op Note (Signed)
Debbie Pineda, Debbie Pineda             ACCOUNT NO.:  [id number]   MEDICAL RECORD NO.:  [id number]          PATIENT TYPE:  AMB   LOCATION:  DAY                           FACILITY:  [facility]   PHYSICIAN:  Dalia Heading, M.D.  DATE OF BIRTH:  05/02/1960   DATE OF PROCEDURE:  11/12/2005  DATE OF DISCHARGE:                                 OPERATIVE REPORT   PREOPERATIVE DIAGNOSIS:  Chronic cholecystitis, hepatitis.   POSTOPERATIVE DIAGNOSIS:  Chronic cholecystitis, hepatitis.   PROCEDURE:  Laparoscopic cholecystectomy, liver biopsy.   SURGEON:  Dalia Heading, M.D.   ANESTHESIA:  General endotracheal.   INDICATIONS:  The patient is a 50 year old morbidly obese white female who  presents with chronic cholecystitis.  She also is noted on ultrasound of the  gallbladder to have a fatty liver, and Dr. Karilyn Cota has request liver biopsy.  Risks and benefits of the procedure, including bleeding, infection,  hepatobiliary injury, and the possibility of an open procedure were fully  explained to the patient, who gave informed consent.   PROCEDURE NOTE:  The patient was placed in the supine position.  After  induction of general endotracheal anesthesia, the abdomen was prepped and  draped using the usual sterile technique with Betadine.  Surgical site  confirmation was performed.   A supraumbilical incision was made down to the fascia.  A Veress needle was  introduced into the abdominal cavity, and confirmation of placement was done  using the saline drop test.  The abdomen was then insufflated to 16 mmHg  pressure.  An 11 mm trocar was introduced into the abdominal cavity under  direct visualization without difficulty.  The patient was placed in reverse  Trendelenburg position.  An additional 1 mm trocar was placed in the  epigastric region, and 5 mm trocars were placed in the right upper quadrant  and right flank regions.  Liver was inspected and noted to be fatty, but  within normal limits.  No  specific lesions were noted.  A single Tru-Cut  needle biopsy was taken of the right lobe of the liver.  This was sent to  pathology for further examination.  Bleeding was controlled using Bovie  electrocautery.  The gallbladder was retracted superior and laterally.  The  dissection was begun around the infundibulum of the gallbladder.  The cystic  duct was first identified.  Its juncture to the infundibulum was fully  identified.  Endoclips were placed proximally and distally on the cystic  duct, and the cystic duct was divided.  This was likewise done to the cystic  artery.  The gallbladder was then freed away from the gallbladder fossa  using Bovie electrocautery.  The gallbladder was delivered through the  epigastric trocar site using an EndoCatch bag.  The gallbladder fossa was  inspected, and no abnormal bleeding or bile leakage was noted.  Surgicel was  placed in the gallbladder fossa.  All fluid and air were then  evacuated  from the abdominal cavity prior to removal of the trocars.   All wounds were irrigated with normal saline.  All wounds were injected with  0.5%  Sensorcaine.  The supraumbilical fascia was reapproximated using an 0  Vicryl interrupted suture.  All skin incisions were closed using staples.  Betadine ointment dry sterile dressings were applied.   All tape and needle counts correct at the end of the procedure.  The patient  was extubated in the operating room and went back to the recovery room  awake, in stable condition.   COMPLICATIONS:  None.   SPECIMENS:  Gallbladder, liver biopsy.   BLOOD LOSS:  Minimal.      Dalia Heading, M.D.  Electronically Signed     MAJ/MEDQ  D:  11/12/2005  T:  11/12/2005  Job:  [id number]   cc:   Short Stay   Kirk Ruths, M.D.  Fax: [phone]   Lionel December, M.D.  [address]  [place]  [place] [address]

## 2010-07-05 ENCOUNTER — Other Ambulatory Visit (INDEPENDENT_AMBULATORY_CARE_PROVIDER_SITE_OTHER): Payer: Self-pay | Admitting: Surgery

## 2010-07-05 LAB — CBC WITH DIFFERENTIAL/PLATELET
Basophils Absolute: 0 10*3/uL (ref 0.0–0.1)
Basophils Relative: 0 % (ref 0–1)
Eosinophils Absolute: 0.2 10*3/uL (ref 0.0–0.7)
Eosinophils Relative: 3 % (ref 0–5)
HCT: 41.3 % (ref 36.0–46.0)
Hemoglobin: 13.4 g/dL (ref 12.0–15.0)
Lymphocytes Relative: 34 % (ref 12–46)
Lymphs Abs: 2.9 10*3/uL (ref 0.7–4.0)
MCH: 31 pg (ref 26.0–34.0)
MCHC: 32.4 g/dL (ref 30.0–36.0)
MCV: 95.6 fL (ref 78.0–100.0)
Monocytes Absolute: 0.4 10*3/uL (ref 0.1–1.0)
Monocytes Relative: 5 % (ref 3–12)
Neutro Abs: 5 10*3/uL (ref 1.7–7.7)
Neutrophils Relative %: 59 % (ref 43–77)
Platelets: 270 10*3/uL (ref 150–400)
RBC: 4.32 MIL/uL (ref 3.87–5.11)
RDW: 14.2 % (ref 11.5–15.5)
WBC: 8.5 10*3/uL (ref 4.0–10.5)

## 2010-07-05 LAB — COMPREHENSIVE METABOLIC PANEL
ALT: 17 U/L (ref 0–35)
AST: 12 U/L (ref 0–37)
Albumin: 4.3 g/dL (ref 3.5–5.2)
Alkaline Phosphatase: 96 U/L (ref 39–117)
BUN: 13 mg/dL (ref 6–23)
CO2: 22 mEq/L (ref 19–32)
Calcium: 9.5 mg/dL (ref 8.4–10.5)
Chloride: 106 mEq/L (ref 96–112)
Creat: 1.03 mg/dL (ref 0.50–1.10)
Glucose, Bld: 103 mg/dL — ABNORMAL HIGH (ref 70–99)
Potassium: 4.6 mEq/L (ref 3.5–5.3)
Sodium: 140 mEq/L (ref 135–145)
Total Bilirubin: 0.3 mg/dL (ref 0.3–1.2)
Total Protein: 7.2 g/dL (ref 6.0–8.3)

## 2010-07-05 LAB — LIPID PANEL
Cholesterol: 214 mg/dL — ABNORMAL HIGH (ref 0–200)
HDL: 51 mg/dL (ref >39)
LDL Cholesterol: 119 mg/dL — ABNORMAL HIGH (ref 0–99)
Total CHOL/HDL Ratio: 4.2 Ratio
Triglycerides: 221 mg/dL — ABNORMAL HIGH (ref <150)
VLDL: 44 mg/dL — ABNORMAL HIGH (ref 0–40)

## 2010-07-05 LAB — TSH: TSH: 5.98 u[IU]/mL — ABNORMAL HIGH (ref 0.350–4.500)

## 2010-07-12 ENCOUNTER — Encounter: Payer: 59 | Attending: Family Medicine | Admitting: *Deleted

## 2010-07-12 DIAGNOSIS — E663 Overweight: Secondary | ICD-10-CM | POA: Insufficient documentation

## 2010-07-12 DIAGNOSIS — Z713 Dietary counseling and surveillance: Secondary | ICD-10-CM | POA: Insufficient documentation

## 2010-07-13 ENCOUNTER — Ambulatory Visit (HOSPITAL_COMMUNITY): Admit: 2010-07-13 | Payer: Self-pay | Admitting: Surgery

## 2010-07-13 ENCOUNTER — Ambulatory Visit (HOSPITAL_COMMUNITY): Admission: RE | Admit: 2010-07-13 | Payer: 59 | Source: Ambulatory Visit

## 2010-07-13 ENCOUNTER — Ambulatory Visit (HOSPITAL_COMMUNITY)
Admission: RE | Admit: 2010-07-13 | Discharge: 2010-07-13 | Disposition: A | Discharge status: Home / Self Care | Payer: 59 | Source: Ambulatory Visit | Attending: Surgery | Admitting: Surgery

## 2010-07-13 ENCOUNTER — Ambulatory Visit (HOSPITAL_COMMUNITY): Payer: 59

## 2010-07-13 DIAGNOSIS — E039 Hypothyroidism, unspecified: Secondary | ICD-10-CM | POA: Insufficient documentation

## 2010-07-13 DIAGNOSIS — IMO0001 Reserved for inherently not codable concepts without codable children: Secondary | ICD-10-CM | POA: Insufficient documentation

## 2010-07-13 DIAGNOSIS — Q211 Atrial septal defect: Secondary | ICD-10-CM | POA: Insufficient documentation

## 2010-07-13 DIAGNOSIS — K449 Diaphragmatic hernia without obstruction or gangrene: Secondary | ICD-10-CM | POA: Insufficient documentation

## 2010-07-13 DIAGNOSIS — E781 Pure hyperglyceridemia: Secondary | ICD-10-CM | POA: Insufficient documentation

## 2010-07-13 DIAGNOSIS — R0602 Shortness of breath: Secondary | ICD-10-CM | POA: Insufficient documentation

## 2010-07-13 DIAGNOSIS — Z01812 Encounter for preprocedural laboratory examination: Secondary | ICD-10-CM | POA: Insufficient documentation

## 2010-07-13 DIAGNOSIS — G4733 Obstructive sleep apnea (adult) (pediatric): Secondary | ICD-10-CM | POA: Insufficient documentation

## 2010-07-13 DIAGNOSIS — Z79899 Other long term (current) drug therapy: Secondary | ICD-10-CM | POA: Insufficient documentation

## 2010-07-13 DIAGNOSIS — Z01811 Encounter for preprocedural respiratory examination: Secondary | ICD-10-CM | POA: Insufficient documentation

## 2010-07-13 DIAGNOSIS — Q2111 Secundum atrial septal defect: Secondary | ICD-10-CM | POA: Insufficient documentation

## 2010-07-13 DIAGNOSIS — Z01810 Encounter for preprocedural cardiovascular examination: Secondary | ICD-10-CM | POA: Insufficient documentation

## 2010-07-18 ENCOUNTER — Ambulatory Visit (HOSPITAL_COMMUNITY)
Admission: RE | Admit: 2010-07-18 | Discharge: 2010-07-18 | Disposition: A | Discharge status: Home / Self Care | Payer: 59 | Source: Ambulatory Visit | Attending: Surgery | Admitting: Surgery

## 2010-07-18 DIAGNOSIS — Z6841 Body Mass Index (BMI) 40.0 and over, adult: Secondary | ICD-10-CM | POA: Insufficient documentation

## 2010-07-18 DIAGNOSIS — Z01818 Encounter for other preprocedural examination: Secondary | ICD-10-CM | POA: Insufficient documentation

## 2010-10-20 ENCOUNTER — Telehealth (INDEPENDENT_AMBULATORY_CARE_PROVIDER_SITE_OTHER): Payer: Self-pay

## 2010-10-20 NOTE — Telephone Encounter (Signed)
Pt called regarding stopping her aspirin preop and I advised it should be 7 days.

## 2010-10-24 ENCOUNTER — Telehealth (INDEPENDENT_AMBULATORY_CARE_PROVIDER_SITE_OTHER): Payer: Self-pay | Admitting: Surgery

## 2010-10-24 NOTE — Telephone Encounter (Signed)
I called and left pt a message that Dr Ezzard Standing said the injection is ok to get preop.

## 2010-10-26 ENCOUNTER — Encounter: Payer: 59 | Attending: Surgery

## 2010-10-26 DIAGNOSIS — Z713 Dietary counseling and surveillance: Secondary | ICD-10-CM | POA: Insufficient documentation

## 2010-10-26 NOTE — Progress Notes (Signed)
  Pre-Operative Nutrition Class  Patient was seen on 10/26/2010 for Pre-Operative Nutrition education at the [facility].   Surgery date: 11/20/10 Surgery type: Gastric Bypass   Weight today: 321.8lbs Weight change: 6.4 lb gain Total weight lost: N/A BMI: 53.7  Samples given per MNT protocol: Bariatric Advantage Multivitamin Lot # [id number] Exp: 12/12  Bariatric Advantage Calcium Citrate Lot # [id number] Exp: 12/13  Celebrate Vitamins Multivitamin Lot # [id number] Exp: 9/13  Celebrate VitaminsCalcium Citrate Lot # [id number] Exp: 6/13  The following the learning objective met by the patient during this course:   Identifies Pre-Op Dietary Goals and will begin 2 weeks pre-operatively   Identifies appropriate sources of fluids and proteins   States protein recommendations and appropriate sources pre and post-operatively  Identifies Post-Operative Dietary Goals and will follow for 2 weeks post-operatively  Identifies appropriate multivitamin and calcium sources  Describes the need for physical activity post-operatively and will follow MD recommendations  States when to call healthcare provider regarding medication questions or post-operative complications  Handouts given during class include:  Pre-Op Bariatric Surgery Diet Handout  Protein Shake Handout  Post-Op Bariatric Surgery Nutrition Handout  BELT Program Information Flyer  Support Group Information Flyer  Follow-Up Plan: Patient will follow-up at [facility] 2 weeks post operatively for diet advancement per MD.

## 2010-11-07 ENCOUNTER — Encounter (INDEPENDENT_AMBULATORY_CARE_PROVIDER_SITE_OTHER): Payer: Self-pay

## 2010-11-10 ENCOUNTER — Encounter (INDEPENDENT_AMBULATORY_CARE_PROVIDER_SITE_OTHER): Payer: Self-pay | Admitting: Surgery

## 2010-11-10 ENCOUNTER — Encounter (INDEPENDENT_AMBULATORY_CARE_PROVIDER_SITE_OTHER): Payer: Self-pay

## 2010-11-10 ENCOUNTER — Ambulatory Visit (INDEPENDENT_AMBULATORY_CARE_PROVIDER_SITE_OTHER): Payer: 59 | Admitting: Surgery

## 2010-11-10 NOTE — Patient Instructions (Signed)
1.  Take bowel prep the day before surgery.  2.  You are on the pre op high protein diet.

## 2010-11-10 NOTE — Progress Notes (Addendum)
ASSESSMENT AND PLAN: 1.  Morbid obesity.  Per the 1991 NIH Consensus Statement, the patient is a candidate for bariatric surgery.  The patient attended our initial information session and reviewed the types of bariatric surgery.    The patient is interested in the Roux en Y Gastric Bypass.  I discussed with the patient the indications and risks of bariatric surgery.  The potential risks of surgery include, but are not limited to, bleeding, infection, leak from the bowel, DVT and PE, open surgery, long term nutrition consequences, and death.  The patient understands the importance of compliance and long term follow-up with our group after surgery.  I discussed that a BMI > 50 does put her at increased risk of complications.  She has completed her preop workup.  I answered questions for the patient and family.  2.  History of small strokes with patent foramen ovale.  Have been stable on aspirin. 3.  Patent foramen ovale. 4.  Sleep apnea on CPAP. 5.  Fibromyalgia - on disability. 6.  Bilateral chronic knee pain. 7.  Hypothyroid, on replacement.  This has been recently adjusted by Dr. Phillips Odor. 8.  Disability. 9.  Chronic pain medicine.  On Narco. 10.  Depression.  ADHD.  She says she saw someone who said she did not have ADHD, but she can not remember her name. 11.  Small HH on UGI.  I do not think this will impact surgery.  Chief Complaint  Patient presents with  . Bariatric Pre-op   REFERRING PHYSICIAN: Colette Ribas, MD  HISTORY OF PRESENT ILLNESS: Debbie Pineda is a 50 y.o. (DOB: 1960/02/05)  white female whose primary care physician is Colette Ribas, MD and comes to me today for pre op eval for RYGB.  The patient has been through our preop bariatric program, understands the surgery, and is accompanied by her husband and daughter Chief Financial Officer).  I answered questions regarding the surgery, the length of hospitalization, and post op recovery.  I wrote her a note about her  OSA and CPAP to get a new mask.  Past Medical History  Diagnosis Date  . Sleep apnea   . Degenerative disk disease   . Degenerative joint disease   . Fibromyalgia   . Hypothyroid   . Hyperglycemia   . Morbid obesity     Past Surgical History  Procedure Date  . Cholecystectomy   . Abdominal hysterectomy   . Knee surgery     Current Outpatient Prescriptions  Medication Sig Dispense Refill  . ARIPiprazole (ABILIFY) 10 MG tablet Take 10 mg by mouth daily.        Marland Kitchen aspirin 81 MG tablet Take 81 mg by mouth daily.        . citalopram (CELEXA) 20 MG tablet Take 20 mg by mouth daily.        . DULoxetine (CYMBALTA) 20 MG capsule Take 20 mg by mouth daily.        Marland Kitchen HYDROcodone-acetaminophen (NORCO) 10-325 MG per tablet Take 1 tablet by mouth every 6 (six) hours as needed.        Marland Kitchen levothyroxine (SYNTHROID, LEVOTHROID) 125 MCG tablet Take 125 mcg by mouth daily.        . methocarbamol (ROBAXIN) 500 MG tablet Take 500 mg by mouth 4 (four) times daily.        Marland Kitchen topiramate (TOPAMAX) 50 MG tablet Take 50 mg by mouth 2 (two) times daily.        Marland Kitchen zolpidem (AMBIEN) 5  MG tablet Take 5 mg by mouth at bedtime as needed.          Allergies  Allergen Reactions  . Flexeril (Cyclobenzaprine Hcl)   . Morphine And Related   . Septra (Bactrim)   . Sulfa Antibiotics    A chest x-ray performed July 13, 2010 was negative.  Upper GI performed 21st of June 2012 showed a small hiatal hernia.  Laboratory for 05 July 2010 showed a normal hemoglobin, normal liver functions, cholesterol 214, and a TSH of 5.98 which is mildly elevated. Her breath tests from 21 July 2010 was negative.  Had an evaluation from Dr. Toula Moos on 30 August 2010 who supports her surgery from a psychiatric standpoint.  REVIEW OF SYSTEMS: Skin:  No history of rash.  No history of abnormal moles. Infection:  No history of hepatitis or HIV.  No history of MRSA. Neurologic: Has had 3 small strokes, maybe secondary to her patent  foramen ovale, around 2007.  Sees Crystal [facility] of [facility] in [place] for memory loss. Cardiac:  No history of hypertension. No history of heart disease.  History of patent foramen ovale, for which she is on aspirin. Pulmonary:  Does not smoke cigarettes.  No asthma or bronchitis.  No OSA/CPAP.  Endocrine:  No diabetes. No thyroid disease. Gastrointestinal:  No history of stomach disease.  No history of liver disease. History of lap chole.  No history of pancreas disease.  No history of colon disease. Urologic:  No history of kidney stones.  No history of bladder infections. Musculoskeletal:  Fibromyalgia, followed by Dr. Corliss Skains.  Has had right ACL repair and left knee arthroscopy in past.  Sees Dr. Despina Hick in discussion about chronic knee problems. Hematologic:  No bleeding disorder.  No history of anemia.  Not anticoagulated. Psycho-social:  Treated for ADHD and depression.  SOCIAL and FAMILY HISTORY: Accompanied by husband and daughter. She is on disability from fibromyalgia and memory problems.  She does not anticipate returning to work if she looses weight.  PHYSICAL EXAM: BP 128/82  Pulse 90  Temp(Src) 98 F (36.7 C) (Temporal)  Resp 24  Ht 5' 5.75" (1.67 m)  Wt 318 lb 4 oz (144.357 kg)  BMI 51.76 kg/m2  General: Obese WF who is alert and generally healthy appearing.  HEENT: Normal. Pupils equal. Good dentition.  Lungs: Clear to auscultation and symmetric breath sounds. Heart:  RRR. No murmur or rub. Abdomen: Soft. No mass. No tenderness. No hernia. Normal bowel sounds.  About half apple and half pear in shape. Rectal: Not done. Extremities:  Good strength and ROM  in upper and lower extremities. Neurologic:  Grossly intact to motor and sensory function. Psychiatric: Has normal mood and affect.  DATA REVIEWED: See notes above.  [name], M.D., [facility], [place] [phone]  UPDATE  Date of surgery:   ________________________________   Planned surgery: _________________________________________________________  The H & P was reviewed, the patient examined, and no change has occurred in the patient's condition or plan of care since this H & P was completed.  ____________________________        Date:  _________________   Time:  _________ [name],  MD, FACS

## 2010-11-15 ENCOUNTER — Telehealth (INDEPENDENT_AMBULATORY_CARE_PROVIDER_SITE_OTHER): Payer: Self-pay

## 2010-11-15 NOTE — Telephone Encounter (Signed)
I called pt after receiving her email to Debbie Pineda.  She has been having a dry cough that is now productive but clear, sore throat, stuffy nose and a head ache.  She is taking Advil, real sudafed, Vitamin C and Zicam cold remedy chewables.  Dr Ezzard Standing was notified because her surgery is 10/29.  I informed the pt she shouldn't be on Advil preop and she won't take anymore.  She may call her medical md tomorrow to be seen.  I told her if they put her on any antibiotics that it is fine preop but for her to call us Friday with an update and let us know how she is doing.

## 2010-11-16 ENCOUNTER — Other Ambulatory Visit (INDEPENDENT_AMBULATORY_CARE_PROVIDER_SITE_OTHER): Payer: Self-pay | Admitting: Surgery

## 2010-11-16 ENCOUNTER — Encounter (HOSPITAL_COMMUNITY): Payer: 59

## 2010-11-16 LAB — COMPREHENSIVE METABOLIC PANEL
ALT: 34 U/L (ref 0–35)
AST: 21 U/L (ref 0–37)
Albumin: 4.2 g/dL (ref 3.5–5.2)
Alkaline Phosphatase: 85 U/L (ref 39–117)
BUN: 22 mg/dL (ref 6–23)
CO2: 24 mEq/L (ref 19–32)
Calcium: 9.8 mg/dL (ref 8.4–10.5)
Chloride: 104 mEq/L (ref 96–112)
Creatinine, Ser: 0.97 mg/dL (ref 0.50–1.10)
GFR calc Af Amer: 79 mL/min — ABNORMAL LOW (ref >90)
GFR calc non Af Amer: 68 mL/min — ABNORMAL LOW (ref >90)
Glucose, Bld: 88 mg/dL (ref 70–99)
Potassium: 4.1 mEq/L (ref 3.5–5.1)
Sodium: 138 mEq/L (ref 135–145)
Total Bilirubin: 0.2 mg/dL — ABNORMAL LOW (ref 0.3–1.2)
Total Protein: 7.6 g/dL (ref 6.0–8.3)

## 2010-11-16 LAB — DIFFERENTIAL
Basophils Absolute: 0 10*3/uL (ref 0.0–0.1)
Basophils Relative: 0 % (ref 0–1)
Eosinophils Absolute: 0.2 10*3/uL (ref 0.0–0.7)
Eosinophils Relative: 2 % (ref 0–5)
Lymphocytes Relative: 29 % (ref 12–46)
Lymphs Abs: 2.8 10*3/uL (ref 0.7–4.0)
Monocytes Absolute: 0.6 10*3/uL (ref 0.1–1.0)
Monocytes Relative: 6 % (ref 3–12)
Neutro Abs: 6.1 10*3/uL (ref 1.7–7.7)
Neutrophils Relative %: 63 % (ref 43–77)

## 2010-11-16 LAB — CBC
HCT: 42 % (ref 36.0–46.0)
Hemoglobin: 13.7 g/dL (ref 12.0–15.0)
MCH: 30.6 pg (ref 26.0–34.0)
MCHC: 32.6 g/dL (ref 30.0–36.0)
MCV: 93.8 fL (ref 78.0–100.0)
Platelets: 286 10*3/uL (ref 150–400)
RBC: 4.48 MIL/uL (ref 3.87–5.11)
RDW: 13.4 % (ref 11.5–15.5)
WBC: 9.7 10*3/uL (ref 4.0–10.5)

## 2010-11-16 LAB — SURGICAL PCR SCREEN
MRSA, PCR: NEGATIVE
Staphylococcus aureus: POSITIVE — AB

## 2010-11-17 ENCOUNTER — Telehealth (INDEPENDENT_AMBULATORY_CARE_PROVIDER_SITE_OTHER): Payer: Self-pay

## 2010-11-17 NOTE — Telephone Encounter (Signed)
We received a call from [place] at [facility].  She states the patient did not stop her Vivelle Patch so she told her to remove it today.   Her surgery is Monday.  I notified Dr Ezzard Standing.

## 2010-11-20 ENCOUNTER — Inpatient Hospital Stay (HOSPITAL_COMMUNITY)
Admission: RE | Admit: 2010-11-20 | Discharge: 2010-11-22 | DRG: 621 | Disposition: A | Discharge status: Home / Self Care | Payer: 59 | Source: Ambulatory Visit | Attending: Surgery | Admitting: Surgery

## 2010-11-20 DIAGNOSIS — G4730 Sleep apnea, unspecified: Secondary | ICD-10-CM | POA: Diagnosis present

## 2010-11-20 DIAGNOSIS — Z6841 Body Mass Index (BMI) 40.0 and over, adult: Secondary | ICD-10-CM

## 2010-11-20 DIAGNOSIS — E039 Hypothyroidism, unspecified: Secondary | ICD-10-CM | POA: Diagnosis present

## 2010-11-20 DIAGNOSIS — IMO0001 Reserved for inherently not codable concepts without codable children: Secondary | ICD-10-CM | POA: Diagnosis present

## 2010-11-20 DIAGNOSIS — G4733 Obstructive sleep apnea (adult) (pediatric): Secondary | ICD-10-CM

## 2010-11-20 DIAGNOSIS — F3289 Other specified depressive episodes: Secondary | ICD-10-CM | POA: Diagnosis present

## 2010-11-20 DIAGNOSIS — Z01812 Encounter for preprocedural laboratory examination: Secondary | ICD-10-CM

## 2010-11-20 DIAGNOSIS — M1990 Unspecified osteoarthritis, unspecified site: Secondary | ICD-10-CM | POA: Diagnosis present

## 2010-11-20 DIAGNOSIS — F329 Major depressive disorder, single episode, unspecified: Secondary | ICD-10-CM | POA: Diagnosis present

## 2010-11-20 HISTORY — PX: ROUX-EN-Y GASTRIC BYPASS: SHX1104

## 2010-11-20 LAB — HEMOGLOBIN AND HEMATOCRIT, BLOOD
HCT: 38.2 % (ref 36.0–46.0)
Hemoglobin: 12.5 g/dL (ref 12.0–15.0)

## 2010-11-21 ENCOUNTER — Ambulatory Visit (HOSPITAL_COMMUNITY): Payer: 59

## 2010-11-21 DIAGNOSIS — Z48812 Encounter for surgical aftercare following surgery on the circulatory system: Secondary | ICD-10-CM

## 2010-11-21 LAB — HEMOGLOBIN AND HEMATOCRIT, BLOOD
HCT: 35.9 % — ABNORMAL LOW (ref 36.0–46.0)
Hemoglobin: 11.9 g/dL — ABNORMAL LOW (ref 12.0–15.0)

## 2010-11-21 LAB — CBC
HCT: 36.6 % (ref 36.0–46.0)
Hemoglobin: 11.9 g/dL — ABNORMAL LOW (ref 12.0–15.0)
MCH: 30.9 pg (ref 26.0–34.0)
MCHC: 32.5 g/dL (ref 30.0–36.0)
MCV: 95.1 fL (ref 78.0–100.0)
Platelets: 222 10*3/uL (ref 150–400)
RBC: 3.85 MIL/uL — ABNORMAL LOW (ref 3.87–5.11)
RDW: 13.6 % (ref 11.5–15.5)
WBC: 9.5 10*3/uL (ref 4.0–10.5)

## 2010-11-21 LAB — DIFFERENTIAL
Basophils Absolute: 0 10*3/uL (ref 0.0–0.1)
Basophils Relative: 0 % (ref 0–1)
Eosinophils Absolute: 0 10*3/uL (ref 0.0–0.7)
Eosinophils Relative: 0 % (ref 0–5)
Lymphocytes Relative: 23 % (ref 12–46)
Lymphs Abs: 2.2 10*3/uL (ref 0.7–4.0)
Monocytes Absolute: 0.7 10*3/uL (ref 0.1–1.0)
Monocytes Relative: 7 % (ref 3–12)
Neutro Abs: 6.6 10*3/uL (ref 1.7–7.7)
Neutrophils Relative %: 70 % (ref 43–77)

## 2010-11-21 MED ORDER — IOHEXOL 300 MG/ML  SOLN
50.0000 mL | Freq: Once | INTRAMUSCULAR | Status: AC | PRN
  Administered 2010-11-21: 50 mL via ORAL

## 2010-11-22 LAB — DIFFERENTIAL
Basophils Absolute: 0 10*3/uL (ref 0.0–0.1)
Basophils Relative: 0 % (ref 0–1)
Eosinophils Absolute: 0.2 10*3/uL (ref 0.0–0.7)
Eosinophils Relative: 2 % (ref 0–5)
Lymphocytes Relative: 27 % (ref 12–46)
Lymphs Abs: 2.2 10*3/uL (ref 0.7–4.0)
Monocytes Absolute: 0.6 10*3/uL (ref 0.1–1.0)
Monocytes Relative: 8 % (ref 3–12)
Neutro Abs: 4.9 10*3/uL (ref 1.7–7.7)
Neutrophils Relative %: 62 % (ref 43–77)

## 2010-11-22 LAB — CBC
HCT: 34.2 % — ABNORMAL LOW (ref 36.0–46.0)
Hemoglobin: 11.1 g/dL — ABNORMAL LOW (ref 12.0–15.0)
MCH: 30.6 pg (ref 26.0–34.0)
MCHC: 32.5 g/dL (ref 30.0–36.0)
MCV: 94.2 fL (ref 78.0–100.0)
Platelets: 178 10*3/uL (ref 150–400)
RBC: 3.63 MIL/uL — ABNORMAL LOW (ref 3.87–5.11)
RDW: 13.7 % (ref 11.5–15.5)
WBC: 7.9 10*3/uL (ref 4.0–10.5)

## 2010-11-24 ENCOUNTER — Telehealth (INDEPENDENT_AMBULATORY_CARE_PROVIDER_SITE_OTHER): Payer: Self-pay

## 2010-11-24 NOTE — Telephone Encounter (Signed)
I called and checked on patient postop and she is doing well.  I reminded her of the postop appointment on 11-15.  She will call with any concerns.

## 2010-11-25 NOTE — Discharge Summary (Signed)
Debbie Pineda, Debbie Pineda             ACCOUNT NO.:  [id number]  MEDICAL RECORD NO.:  [id number]  LOCATION:  1524                         FACILITY:  [facility]  PHYSICIAN:  Sandria Bales. Ezzard Standing, M.D.  DATE OF BIRTH:  1960-08-10  DATE OF ADMISSION:  11/20/2010 DATE OF DISCHARGE:  11/22/2010                              DISCHARGE SUMMARY  DISCHARGE DIAGNOSES: 1. Morbid obesity with a weight of 318 pounds, body mass index of     51.8. 2. History of small stroke secondary to patent foramen ovale. 3. Sleep apnea, on continuous positive airway pressure. 4. Fibromyalgia, for which she is on disability. 5. Chronic knee pain bilaterally. 6. Hypothyroid, on replacement. 7. History of chronic pain medicines. 8. Depression and attention deficit hyperactivity disorder.  OPERATION PERFORMED:  Patient underwent a laparoscopic Roux-en-Y gastric bypass with upper endoscopy on November 20, 2010.  HISTORY OF ILLNESS:  Debbie Pineda is a 50 year old white female who sees Dr. Assunta Found as her primary medical doctor, has been morbidly obese much of her adult life, has been through our preoperative bariatric program.  She comes to the hospital for a laparoscopic Roux-en-Y gastric bypass.  I have been through a thorough discussion with her and her husband about the indications and complications of gastric bypass, and she has completed our  preoperative bariatric program.  COMORBID CONDITIONS:  Include: 1. History of small strokes secondary to patent foramen ovale. 2. Sleep apnea on CPAP. 3. History of fibromyalgia. 4. Bilateral chronic knee pain. 5. Hypothyroid, on replacement. 6. On chronic pain medications, Norco. 7. History of depression and ADHD. 8. Small hiatal hernia on upper GI.  HOSPITAL COURSE:  Patient was taken to the operating room on the day of admission where she underwent a laparoscopic Roux-en-Y gastric bypass and upper endoscopy by Dr. [name] and this was on November 20, 2010.  Postoperatively, she did well.  On the first postoperative day, her hemoglobin was 11, white blood count 9500.  She had a swallow, which showed no evidence of leak and had Dopplers of her lower extremities, which showed no DVT.  She was started on clear liquids.  By the second postoperative day, she is afebrile.  Her white blood count 7900.  She is tolerating protein shakes and ready for discharge.  DISCHARGE MEDICATIONS:  Include resuming her home medicines. 1. Sudafed. 2. Zicam. 3. Topamax. 4. Alprazolam. 5. Celexa. 6. Ambien. 7. Cymbalta. 8. Abilify. 9. Levothyroxine 175 mcg daily. 10. We had a discussion about her aspirin that she takes for her patent foramen ovale that she is on hold for about a week and then probably restart.  We did talk about how there is a risk for marginal ulcerations secondary to NSAID use.  She also has taken Advil in the past and we went through this discussion.   She is given Roxicet elixir for pain. She should not drive for 7 days.   She will stay on a gastric bypass postop diet until she sees the dietitian and they advance her diet. She can shower.  She has an appointment to see me in about 2 or 3 weeks and an appointment for the nutritionist in 2 or 3 weeks.  DISCHARGE CONDITION:  Good.  She is ready for discharge.   Sandria Bales. Ezzard Standing, M.D., FACS   DHN/MEDQ  D:  11/25/2010  T:  11/25/2010  Job:  [id number]  cc:   Corrie Mckusick, M.D. Fax: [phone]  Electronically Signed by [name] M.D. on 11/25/2010 04:18:34 PM

## 2010-11-25 NOTE — Op Note (Signed)
Debbie Pineda, IGNASIAK             ACCOUNT NO.:  [id number]  MEDICAL RECORD NO.:  [id number]  LOCATION:  1524                         FACILITY:  [facility]  PHYSICIAN:  Sandria Bales. Ezzard Standing, M.D.  DATE OF BIRTH:  11-02-1960  DATE OF PROCEDURE:  11/20/2010                              OPERATIVE REPORT   PREOPERATIVE DIAGNOSIS:  Morbid obesity, body mass index of 51.8.  Her preoperative weight is 318.  POSTOPERATIVE DIAGNOSIS:  Morbid obesity, body mass index of 51.8, weight of 318.  PROCEDURE:  Laparoscopic Roux-en-Y gastric bypass, antecolic antigastric.  Upper endoscopy.  SURGEON:  Sandria Bales. Ezzard Standing, M.D..  FIRST ASSISTANT:  Thornton Park. Daphine Deutscher, MD  ANESTHESIA:  General endotracheal with approximately 100 mL of 0.25% Marcaine.  COMPLICATIONS:  None.  INDICATION FOR PROCEDURE:  Debbie Pineda is a 50 year old white female, who sees Dr. Assunta Found as her primary medical doctor, has been obese much of her adult life, has been through our preoperative bariatric program and now comes for attempted laparoscopic Roux-en-Y gastric bypass.  The indications and potential complications of the surgery explained to the patient.  Potential complications include, but are not limited to, bleeding, infection, her leak from her bowel, DVT/PE, open surgery, and long-term nutritional consequences.  OPERATIVE NOTE:  The patient placed in a supine position, given a general endotracheal anesthesia, and supervised by Dr. Lucille Passy in room #1.  She was given preoperative DVT prophylaxis and antibiotics with cefoxitin 2 g.  A time-out was held and the surgical checklist run.  The abdomen was prepped with ChloraPrep and sterilely draped.  An incision was made in the left upper quadrant with a 12 mm Optiview trocar inserted to the abdominal cavity.  The abdomen was insufflated. I placed additional trocars.  I placed a 5 mm in the subxiphoid location with a liver retractor, a 12 mm right  paramedian, a 12 mm trocar in the right subcostal, a 12 mm trocar right paramedian, 10 mm trocar below the umbilicus, a 12 mm to the left paramedian, and a 5 mm left lateral subcostal.  Abdominal exploration revealed right and left lobes of liver were unremarkable.  The stomach was unremarkable.  The bowel that I could see was unremarkable.  She had a moderate amount of omental fat.  Her liver looked in reasonably good shape.  She had a lot of fat around her proximal stomach.  I first identified the ligament of Treitz, counted down 40 cm and divided the small bowel with a white load of the Ethicon 45 mm Endo-GIA stapler.  I then counted 100 cm for the future gastric limb of jejunum.  I then did a side-to-side jejunojejunostomy anastomosis using a 45 mm white in Ethicon Endo-GIA stapler and closed the enterotomy with a running 2-0 Vicryl sutures.  I placed an additional suture in the anastomosis and placed Tisseel over the wound and closed the mesentery with a running 2- 0 silk suture.  I then divided the omentum up to the transverse colon.  Her mental length was probably about 15-18 cm.  The patient was then placed in reverse Trendelenburg position.  The [facility] liver retractor in the left lobe of the liver, and started dissection of  the stomach.  Again, she had a lot of fat around her proximal stomach, identified the angle of His to the left esophagogastric junction and made an incision in this area.  I then went along the lesser curve.  I tried to go 5 cm below the gastroesophageal junction.  I got into the lesser sac.  I then first used a blue load of the 45 mm Ethicon Endo-GIA stapler and then I used 3 blue loads of the 60 mm echelon stapler to create a 3 x 5 cm stomach pouch.  The pouch looked healthy.  I then over sewed the gastric remnant with a locking running 2-0 Vicryl suture and tried to place Tisseel along the new greater curvature of the stomach pouch.  I then  brought the jejunum antecolic and antigastric up to create a gastrojejunostomy.  I placed a posterior stitch of running 2-0 Vicryl suture.  Unfortunately, when I made my enterotomies, I divided the suture line and had to repair this posteriorly, but it looked like it came together well.  I used the Ewald tube to advance into the stomach and I made incision into the stomach ends with the Ewald tube and incision into the jejunum.  I then used a blue load of the 45 mm Ethicon Endo-GIA stapler to do a side-to-side stapled gastrojejunostomy anastomosis.  The wound was then irrigated.  There was no bleeding.  I closed the enterotomy with two 2-0 Vicryl sutures.  I then did a running anterior 2-0 Vicryl suture as a second layer anteriorly and sort of a connel fashion and this was done while the Ewald tube was through the anastomosis.  I then withdrew the Ewald tube. I closed Petersen defect with a figure-of-eight silk suture at the transverse colon.  At this point, Dr. Daphine Deutscher broke scrub.  He did upper endoscopy, advanced the scope into the pouch.  The pouch was viable, there was no bleeding.  She had a 5 cm pouch.  He insufflated the pouch while I clamped off the small bowel and flooded the upper abdomen with saline, there was no bubbling and the anastomosis looked patent.  He then withdrew the scope. I sucked out the saline that had used for irrigant.  I then placed 5 mL of Tisseel over the gastrojejunostomy over the end jejunal jejunostomy and the new greater curvature.  I did leave a long jejunal limb, probably 10-15 cm in length, but I think this provided the least amount of tension on the anastomosis.  The patient tolerated the procedure well, was transported to the recovery room in good condition.  Sponge and needle count were correct in the case.   Sandria Bales. Ezzard Standing, M.D., FACS   DHN/MEDQ  D:  11/20/2010  T:  11/20/2010  Job:  [id number]  cc:   Corrie Mckusick, M.D. Fax:  [address] PA-C Fax: [phone]  Dr. Corliss Skains  Electronically Signed by [name] M.D. on 11/25/2010 07:26:37 AM

## 2010-11-28 ENCOUNTER — Telehealth (INDEPENDENT_AMBULATORY_CARE_PROVIDER_SITE_OTHER): Payer: Self-pay

## 2010-11-28 NOTE — Telephone Encounter (Signed)
Clo increased heart rate 120's when trying to exercise (walking). History of " PFO" (hole in the heart). Patient advise to call her heart doctor and report this information and follow up with their office. Since it is their specialty.

## 2010-11-29 DIAGNOSIS — Z9049 Acquired absence of other specified parts of digestive tract: Secondary | ICD-10-CM | POA: Insufficient documentation

## 2010-11-29 DIAGNOSIS — Z90710 Acquired absence of both cervix and uterus: Secondary | ICD-10-CM | POA: Insufficient documentation

## 2010-11-29 DIAGNOSIS — Z9889 Other specified postprocedural states: Secondary | ICD-10-CM | POA: Insufficient documentation

## 2010-11-29 DIAGNOSIS — Z9079 Acquired absence of other genital organ(s): Secondary | ICD-10-CM

## 2010-11-29 DIAGNOSIS — I879 Disorder of vein, unspecified: Secondary | ICD-10-CM

## 2010-11-29 DIAGNOSIS — Z90722 Acquired absence of ovaries, bilateral: Secondary | ICD-10-CM

## 2010-11-29 DIAGNOSIS — Z9884 Bariatric surgery status: Secondary | ICD-10-CM | POA: Insufficient documentation

## 2010-11-29 HISTORY — DX: Disorder of vein, unspecified: I87.9

## 2010-12-01 ENCOUNTER — Encounter (INDEPENDENT_AMBULATORY_CARE_PROVIDER_SITE_OTHER): Payer: Self-pay

## 2010-12-05 ENCOUNTER — Encounter: Payer: 59 | Attending: Surgery | Admitting: *Deleted

## 2010-12-05 DIAGNOSIS — Z713 Dietary counseling and surveillance: Secondary | ICD-10-CM | POA: Insufficient documentation

## 2010-12-05 NOTE — Patient Instructions (Signed)
Patient to follow Phase 3A-Soft, High Protein Diet and follow-up at [facility] in 6 weeks for 2 months post-op nutrition visit for diet advancement. 

## 2010-12-05 NOTE — Progress Notes (Signed)
  Bariatric Class:  Appt start time: 1600 end time:  1700.  2 Week Post-Operative Nutrition Class  Patient was seen on 12/05/2010 for Post-Operative Nutrition education at [facility].   Surgery date: 11/20/10  Surgery type: Gastric Bypass  Weight today: 301.4 Weight change: 20.4 BMI: 50.3%  The following the learning objective met the patient during this course:   Identifies Phase 3A (Soft, High Proteins) Dietary Goals and will begin from 2 weeks post-operatively to 2 months post-operatively   Identifies appropriate sources of fluids and proteins   States protein recommendations and appropriate sources post-operatively  Identifies the need for appropriate texture modifications, mastication, and bite sizes when consuming solids  Identifies appropriate multivitamin and calcium sources post-operatively  Describes the need for physical activity post-operatively and will follow MD recommendations  States when to call healthcare provider regarding medication questions or post-operative complications  Handouts given during class include:  Phase 3A: Soft, High Protein Diet Handout  Band Fill Guidelines Handout  Follow-Up Plan: Patient will follow-up at [facility] in 6 weeks for 2 months post-op nutrition visit for diet advancement per MD.

## 2010-12-07 ENCOUNTER — Encounter (INDEPENDENT_AMBULATORY_CARE_PROVIDER_SITE_OTHER): Payer: Self-pay | Admitting: Surgery

## 2010-12-07 ENCOUNTER — Ambulatory Visit (INDEPENDENT_AMBULATORY_CARE_PROVIDER_SITE_OTHER): Payer: 59 | Admitting: Surgery

## 2010-12-07 VITALS — BP 118/80 | HR 60 | Temp 96.9°F | Resp 14 | Ht 65.5 in | Wt 300.0 lb

## 2010-12-07 DIAGNOSIS — Z9884 Bariatric surgery status: Secondary | ICD-10-CM | POA: Insufficient documentation

## 2010-12-07 DIAGNOSIS — Z09 Encounter for follow-up examination after completed treatment for conditions other than malignant neoplasm: Secondary | ICD-10-CM

## 2010-12-07 NOTE — Progress Notes (Signed)
[facility]  [name], MD,  FACS [address] [place]  [address] [place], [place]    [address] Phone:  [phone] FAX:  [phone]  ASSESSMENT AND PLAN: 1. Morbid obesity.   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  She's down about 20 pounds since surgery.  To check labs at 3 months.  I will see her back in 3 months.  2. History of small strokes with patent foramen ovale.   Have been stable on aspirin.   She's going to restart this, which is okay.  We have talked about the risks of NSAID in RYGB patients.   3. Patent foramen ovale.  4. Sleep apnea on CPAP.  5. Fibromyalgia - on disability.  6. Bilateral chronic knee pain.  7. Hypothyroid, on replacement. This has been recently adjusted by Dr. Phillips Odor.  8. Disability.  9. Chronic pain medicine. On Narco.  10. Depression. ADHD. She says she saw someone who said she did not have ADHD, but she can not remember her name.  11. Small HH on UGI. I do not think this will impact surgery.   HISTORY OF PRESENT ILLNESS: Chief Complaint  Patient presents with  . Routine Post Op    1st post op RNY gastric    Debbie Pineda is a 50 y.o. (DOB: 12-Jun-1960)  white female who is a patient of GOLDING,JOHN CABOT, MD and comes to me today for follow-up for RYGB.  The patient has done well since surgery. She did have some chest pain and she saw her cardiologist, but this proved to be nothing.  Her symptoms have resolved.  The dietitian has started her on solid food. She wants to start salads, I recommended she wait 2 more weeks. The salads need to be finely chopped.  Her daughter is with her. I answered their questions.  PHYSICAL EXAM: BP 118/80  Pulse 60  Temp(Src) 96.9 F (36.1 C) (Temporal)  Resp 14  Ht 5' 5.5" (1.664 m)  Wt 300 lb (136.079 kg)  BMI 49.16 kg/m2  General: WN WF who is alert and generally healthy appearing.  HEENT: Normal. Pupils equal. Good dentition. Lungs: Clear to  auscultation and symmetric breath sounds. Heart:  RRR. No murmur or rub. Abdomen: Soft. No mass. No tenderness. No hernia. Normal bowel sounds.  Abdominal incisions look good. Rectal: Not done. Extremities:  Good strength and ROM  in upper and lower extremities. Neurologic:  Grossly intact to motor and sensory function. Psychiatric: Has normal mood and affect. Behavior is normal.

## 2010-12-07 NOTE — Patient Instructions (Signed)
1.  Have blood work drawn 1 week prior to seeing me back.  2.  I want to see you back in 3 months.

## 2010-12-18 ENCOUNTER — Other Ambulatory Visit (HOSPITAL_COMMUNITY): Payer: Self-pay | Admitting: Family Medicine

## 2010-12-18 DIAGNOSIS — Z139 Encounter for screening, unspecified: Secondary | ICD-10-CM

## 2010-12-21 ENCOUNTER — Ambulatory Visit (HOSPITAL_COMMUNITY): Payer: 59

## 2010-12-22 ENCOUNTER — Ambulatory Visit (HOSPITAL_COMMUNITY)
Admission: RE | Admit: 2010-12-22 | Discharge: 2010-12-22 | Disposition: A | Discharge status: Home / Self Care | Payer: 59 | Source: Ambulatory Visit | Attending: Family Medicine | Admitting: Family Medicine

## 2010-12-22 DIAGNOSIS — Z1231 Encounter for screening mammogram for malignant neoplasm of breast: Secondary | ICD-10-CM | POA: Insufficient documentation

## 2010-12-22 DIAGNOSIS — Z139 Encounter for screening, unspecified: Secondary | ICD-10-CM

## 2011-01-26 ENCOUNTER — Ambulatory Visit (INDEPENDENT_AMBULATORY_CARE_PROVIDER_SITE_OTHER): Payer: 59 | Admitting: Surgery

## 2011-01-26 ENCOUNTER — Encounter (INDEPENDENT_AMBULATORY_CARE_PROVIDER_SITE_OTHER): Payer: Self-pay | Admitting: Surgery

## 2011-01-26 ENCOUNTER — Other Ambulatory Visit (INDEPENDENT_AMBULATORY_CARE_PROVIDER_SITE_OTHER): Payer: Self-pay

## 2011-01-26 VITALS — BP 108/78 | HR 88 | Temp 96.9°F | Resp 16 | Ht 65.5 in | Wt 282.6 lb

## 2011-01-26 DIAGNOSIS — Z09 Encounter for follow-up examination after completed treatment for conditions other than malignant neoplasm: Secondary | ICD-10-CM

## 2011-01-26 DIAGNOSIS — Z9884 Bariatric surgery status: Secondary | ICD-10-CM

## 2011-01-26 NOTE — Progress Notes (Signed)
[facility]  Debbie Kin, MD,  FACS [address] [place]  [address] [place], [place]    [address] Phone:  [phone] FAX:  [phone]  ASSESSMENT AND PLAN: 1. Morbid obesity.   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  She's down about 35 pounds since surgery.  To check labs at 3 months.  (She failed to get her labs before this visit, so we'll send her over today.)  I will see her back in 3 months.  2. History of small strokes with patent foramen ovale.   Have been stable on aspirin.  She's on the 81 mg for about 9 months.  Then will probably go to a full dose aspirin. 3. Patent foramen ovale. She cannot remember her cardiologist name. 4. Sleep apnea on CPAP.  5. Fibromyalgia - on disability.  6. Bilateral chronic knee pain. See's Dr. Tana Felts office.  She has had some shots of Syndisk in the knees.  She also takes Lodine for pain.  We talked about the limits of NSAID (I've done this once before) 7. Hypothyroid, on replacement. This has been recently adjusted by Dr. Phillips Odor.  8. Disability.  9. Chronic pain medicine. On Narco.  10. Depression. ADHD. She says she saw someone who said she did not have ADHD, but she can not remember her name.  11. Small HH on UGI. 12.  Memory issues - Sees Debbie Pineda of [facility] in [place] for this.   HISTORY OF PRESENT ILLNESS: Chief Complaint  Patient presents with  . Bariatric Follow Up    RNY 11/20/10    Debbie Pineda is a 51 y.o. (DOB: 1960-12-09)  white female who is a patient of Colette Ribas, MD, MD and comes to me today for follow-up for RYGB.  She comes by herself.  She is doing very well.  She has some trouble with broccoli, but most foods have caused no problem.  She is having joint trouble with her knees, but it is better with her weight loss.  PHYSICAL EXAM: BP 108/78  Pulse 88  Temp(Src) 96.9 F (36.1 C) (Temporal)  Resp 16  Ht 5' 5.5" (1.664 m)  Wt 282 lb 9.6 oz  (128.187 kg)  BMI 46.31 kg/m2  General: WN WF who is alert and generally healthy appearing.  HEENT: Normal. Pupils equal. Good dentition. Lungs: Clear to auscultation and symmetric breath sounds. Heart:  RRR. No murmur or rub.  Abdomen: Soft. No mass. No tenderness. No hernia. Normal bowel sounds.  Abdominal incisions look good. Extremities:  Good strength and ROM  in upper and lower extremities. Neurologic:  Grossly intact to motor and sensory function. Psychiatric: Has normal mood and affect. Behavior is normal.   LABS: She is to go get these.

## 2011-01-26 NOTE — Patient Instructions (Signed)
1.  Repeat lab work in three months.  2.  I'll see you back in three months.

## 2011-01-27 LAB — COMPREHENSIVE METABOLIC PANEL
ALT: 73 U/L — ABNORMAL HIGH (ref 0–35)
AST: 46 U/L — ABNORMAL HIGH (ref 0–37)
Albumin: 4.1 g/dL (ref 3.5–5.2)
Alkaline Phosphatase: 108 U/L (ref 39–117)
BUN: 20 mg/dL (ref 6–23)
CO2: 19 mEq/L (ref 19–32)
Calcium: 9.6 mg/dL (ref 8.4–10.5)
Chloride: 108 mEq/L (ref 96–112)
Creat: 1.12 mg/dL — ABNORMAL HIGH (ref 0.50–1.10)
Glucose, Bld: 96 mg/dL (ref 70–99)
Potassium: 4.5 mEq/L (ref 3.5–5.3)
Sodium: 140 mEq/L (ref 135–145)
Total Bilirubin: 0.4 mg/dL (ref 0.3–1.2)
Total Protein: 6.9 g/dL (ref 6.0–8.3)

## 2011-01-27 LAB — FOLATE: Folate: >20 ng/mL

## 2011-01-27 LAB — LIPID PANEL
Cholesterol: 154 mg/dL (ref 0–200)
HDL: 44 mg/dL (ref >39)
LDL Cholesterol: 79 mg/dL (ref 0–99)
Total CHOL/HDL Ratio: 3.5 Ratio
Triglycerides: 154 mg/dL — ABNORMAL HIGH (ref <150)
VLDL: 31 mg/dL (ref 0–40)

## 2011-01-27 LAB — CBC WITH DIFFERENTIAL/PLATELET
Basophils Absolute: 0 10*3/uL (ref 0.0–0.1)
Basophils Relative: 0 % (ref 0–1)
Eosinophils Absolute: 0.2 10*3/uL (ref 0.0–0.7)
Eosinophils Relative: 2 % (ref 0–5)
HCT: 45.1 % (ref 36.0–46.0)
Hemoglobin: 14.3 g/dL (ref 12.0–15.0)
Lymphocytes Relative: 38 % (ref 12–46)
Lymphs Abs: 3 10*3/uL (ref 0.7–4.0)
MCH: 29.9 pg (ref 26.0–34.0)
MCHC: 31.7 g/dL (ref 30.0–36.0)
MCV: 94.2 fL (ref 78.0–100.0)
Monocytes Absolute: 0.4 10*3/uL (ref 0.1–1.0)
Monocytes Relative: 5 % (ref 3–12)
Neutro Abs: 4.2 10*3/uL (ref 1.7–7.7)
Neutrophils Relative %: 55 % (ref 43–77)
Platelets: 268 10*3/uL (ref 150–400)
RBC: 4.79 MIL/uL (ref 3.87–5.11)
RDW: 13.7 % (ref 11.5–15.5)
WBC: 7.7 10*3/uL (ref 4.0–10.5)

## 2011-01-27 LAB — IRON AND TIBC
%SAT: 25 % (ref 20–55)
Iron: 99 ug/dL (ref 42–145)
TIBC: 389 ug/dL (ref 250–470)
UIBC: 290 ug/dL (ref 125–400)

## 2011-01-27 LAB — VITAMIN B12: Vitamin B-12: >2000 pg/mL — ABNORMAL HIGH (ref 211–911)

## 2011-01-27 LAB — MAGNESIUM: Magnesium: 2.1 mg/dL (ref 1.5–2.5)

## 2011-01-27 LAB — IRON: Iron: 99 ug/dL (ref 42–145)

## 2011-01-30 DIAGNOSIS — IMO0001 Reserved for inherently not codable concepts without codable children: Secondary | ICD-10-CM | POA: Diagnosis not present

## 2011-01-30 DIAGNOSIS — M76899 Other specified enthesopathies of unspecified lower limb, excluding foot: Secondary | ICD-10-CM | POA: Diagnosis not present

## 2011-01-30 DIAGNOSIS — M25569 Pain in unspecified knee: Secondary | ICD-10-CM | POA: Diagnosis not present

## 2011-01-30 DIAGNOSIS — M19049 Primary osteoarthritis, unspecified hand: Secondary | ICD-10-CM | POA: Diagnosis not present

## 2011-01-31 DIAGNOSIS — Z23 Encounter for immunization: Secondary | ICD-10-CM | POA: Diagnosis not present

## 2011-01-31 DIAGNOSIS — G500 Trigeminal neuralgia: Secondary | ICD-10-CM | POA: Diagnosis not present

## 2011-01-31 DIAGNOSIS — E669 Obesity, unspecified: Secondary | ICD-10-CM | POA: Diagnosis not present

## 2011-01-31 DIAGNOSIS — E039 Hypothyroidism, unspecified: Secondary | ICD-10-CM | POA: Diagnosis not present

## 2011-02-01 ENCOUNTER — Ambulatory Visit: Payer: 59 | Admitting: *Deleted

## 2011-02-15 ENCOUNTER — Encounter: Payer: Self-pay | Admitting: *Deleted

## 2011-02-15 ENCOUNTER — Encounter: Payer: 59 | Attending: Surgery | Admitting: *Deleted

## 2011-02-15 DIAGNOSIS — Z713 Dietary counseling and surveillance: Secondary | ICD-10-CM | POA: Insufficient documentation

## 2011-02-15 DIAGNOSIS — Z9884 Bariatric surgery status: Secondary | ICD-10-CM

## 2011-02-15 NOTE — Patient Instructions (Signed)
Goals:  Follow Phase 3B: High Protein + Non-Starchy Vegetables  Eat 3-6 small meals/snacks, every 3-5 hrs  Increase lean protein foods to meet 60-85g goal  Increase fluid intake to 64oz +  Avoid drinking 15 minutes before, during and 30 minutes after eating  Aim for >30 min of physical activity daily  Keep up the good work! See you in 6-8 weeks!

## 2011-02-15 NOTE — Progress Notes (Signed)
  Follow-up visit: 8 Weeks Post-Operative Gastric Bypass Surgery  Medical Nutrition Therapy:  Appt start time: 1000 end time:  1030.  Assessment:  Primary concerns today: post-operative bariatric surgery nutrition management. [place] notes that she is doing okay lately. No nutrition related issues to report today. Pt has added most vegetables to her diet (many weeks ago) and has struggled to get in her protein as well has having some intolerance problems with these vegetables. Will educate pt on the importance of protein and ways to cook non-starchy vegetables for tolerance.  Surgery date: 11/20/10  Surgery type: Gastric Bypass   Weight today: 274.8 lbs Weight change: 26.6 lbs Total weight lost: 47 lbs total BMI: 45.8% Weight goal: 175 lbs [facility] weight at [facility]: 321.8 lbs  24-hr recall:  B (8 AM): 1 egg w/ melted cheese Snk (AM): SF popsicle OR string cheese OR Protein shake  L (12-1 PM): Tuna salad (3 oz)  Snk (PM): no snack  D (6-7 PM): Lean meat (pork chop or chicken breast = 2-3 oz)  w/ green beans OR Chili Snk (8-10 PM): string cheese pieces OR 1 SF popsicle OR Protein shake *Analis notes that her habit of late night eating is still troubling her  Fluid intake: Crystal Light, water ("makes me nauseous"), decaf tea w/ splenda, protein shake = 50 oz Estimated total protein intake: ~65g  Medications: Tegretol added due to Trigeminal Neuralgia; No other changes Supplementation: Taking supplements regularly  Using straws: No Drinking while eating: No Hair loss: No Carbonated beverages: no N/V/D/C: No Dumping syndrome: No  Recent physical activity:  Pool therapy, as able, which usually ends up being 2-3 times per week for 60 minutes  Progress Towards Goal(s):  In progress.  Handouts given during visit include:  Phase 3B - High Protein Diet + Non-Starchy Vegetables   Nutritional Diagnosis:  [place]-3.3 Overweight/obesity As related to recent Gastric Bypass surgery.  As evidenced by  pt following Gastric Bypass nutrition guidelines for continued weight loss.    Intervention:  Nutrition education.  Monitoring/Evaluation:  Dietary intake, exercise, lap band fills, and body weight. Follow up in 6-8 months for 3-4 month post-op visit.

## 2011-03-26 ENCOUNTER — Encounter: Payer: 59 | Attending: Surgery | Admitting: *Deleted

## 2011-03-26 ENCOUNTER — Encounter: Payer: Self-pay | Admitting: *Deleted

## 2011-03-26 VITALS — Ht 65.0 in | Wt 268.2 lb

## 2011-03-26 DIAGNOSIS — Z9884 Bariatric surgery status: Secondary | ICD-10-CM

## 2011-03-26 DIAGNOSIS — Z713 Dietary counseling and surveillance: Secondary | ICD-10-CM | POA: Insufficient documentation

## 2011-03-26 NOTE — Patient Instructions (Addendum)
Goals:  Follow Phase 3B: High Protein + Non-Starchy Vegetables  OR alternatively Pre-Op Diet  Eat 3-6 small meals/snacks, every 3-5 hrs  Increase lean protein foods to meet 60-80g goal  Increase fluid intake to 64oz +  Avoid drinking 15 minutes before, during and 30 minutes after eating  Aim for >30 min of physical activity daily

## 2011-03-26 NOTE — Progress Notes (Signed)
  Follow-up visit: 4 Months Post-Operative Gastric Bypass Surgery  Medical Nutrition Therapy:  Appt start time: 0815 end time:  0845.  Assessment:  Primary concerns today: post-operative bariatric surgery nutrition management. Debbie Pineda notes that she is doing well s/p surgery. Her weight loss has slowed down since her last visit yet remains WNL as she is exercising and building muscle tissues via cycling classes, weights, and pool therapy. Dietary habits are WNL and pt continues to follow post-op diet guidelines. Pt wants to add more carbohydrate (fruits) to her diet when she has lost 50% of her weight goal (which is about 75 lbs lost). Will continue to follow.  Surgery date: 11/20/10  Surgery type: Gastric Bypass   Weight today: 268.2 lbs Weight change: 6.6 lbs Total weight lost: 53.6 lbs total BMI: 44.7% Weight goal: 175 lbs Start weight at [facility]: 321.8 lbs  24-hr recall:  B (8 AM): 1 egg w/ melted cheese OR 2 oz ham w/ 2 oz low-fat cheese Snk (AM): [place] yogurt OR Protein shake  L (12-1 PM): Romaine lettuce salad w/ tuna or chicken (2 cups) w/ lite dressing Snk (PM): 1 cheese stick OR EAS Protein drink D (6-7 PM): Lean meat (pork chop or chicken breast = 2-3 oz)  w/ green beans OR Chili Snk (8-10 PM): string cheese pieces OR 1 SF popsicle OR Protein shake *Debbie Pineda notes that her habit of late night eating is still troubling her  Fluid intake: Crystal Light, water, Protein shake, decaf tea w/ splenda, protein shake = 50 oz  Estimated total protein intake: ~75g  Medications: No new chagnes Supplementation: Taking supplements regularly w/ no problems reported  Using straws: No Drinking while eating: No Hair loss: No Carbonated beverages: no N/V/D/C: Occasional nausea with certain foods; Occasional constipation uses Colace prn Dumping syndrome: No  Recent physical activity:  3-5 days per week in the Gym doing "Recumbent Cross Trainer" Classes @ [employer]/Pool = 60-120 minutes each  visit  Progress Towards Goal(s):  In progress.  Handouts given during visit include:  Phase 3B - High Protein Diet + Non-Starchy Vegetables  High Fiber Foods   Nutritional Diagnosis:  [place]-3.3 Overweight/obesity As related to recent Gastric Bypass surgery.  As evidenced by pt following Gastric Bypass nutrition guidelines for continued weight loss.    Intervention:  Nutrition education.  Monitoring/Evaluation:  Dietary intake, exercise, and body weight. Follow up in 2-3 months for 6-8 month post-op visit.

## 2011-03-29 ENCOUNTER — Ambulatory Visit: Payer: 59 | Admitting: *Deleted

## 2011-04-12 DIAGNOSIS — IMO0001 Reserved for inherently not codable concepts without codable children: Secondary | ICD-10-CM | POA: Diagnosis not present

## 2011-04-12 DIAGNOSIS — M6281 Muscle weakness (generalized): Secondary | ICD-10-CM | POA: Diagnosis not present

## 2011-04-12 DIAGNOSIS — M2550 Pain in unspecified joint: Secondary | ICD-10-CM | POA: Diagnosis not present

## 2011-04-12 DIAGNOSIS — R293 Abnormal posture: Secondary | ICD-10-CM | POA: Diagnosis not present

## 2011-04-25 ENCOUNTER — Other Ambulatory Visit (INDEPENDENT_AMBULATORY_CARE_PROVIDER_SITE_OTHER): Payer: Self-pay | Admitting: Surgery

## 2011-04-27 ENCOUNTER — Other Ambulatory Visit (INDEPENDENT_AMBULATORY_CARE_PROVIDER_SITE_OTHER): Payer: Self-pay | Admitting: Surgery

## 2011-04-28 LAB — FOLATE: Folate: 11.4 ng/mL

## 2011-04-28 LAB — VITAMIN B12: Vitamin B-12: 1743 pg/mL — ABNORMAL HIGH (ref 211–911)

## 2011-04-28 LAB — IRON AND TIBC
%SAT: 21 % (ref 20–55)
Iron: 84 ug/dL (ref 42–145)
TIBC: 407 ug/dL (ref 250–470)
UIBC: 323 ug/dL (ref 125–400)

## 2011-04-28 LAB — MAGNESIUM: Magnesium: 2 mg/dL (ref 1.5–2.5)

## 2011-05-01 LAB — VITAMIN B1: Vitamin B1 (Thiamine): 18 nmol/L (ref 8–30)

## 2011-05-03 ENCOUNTER — Ambulatory Visit (INDEPENDENT_AMBULATORY_CARE_PROVIDER_SITE_OTHER): Payer: 59 | Admitting: Surgery

## 2011-05-03 VITALS — BP 120/82 | HR 60 | Temp 97.4°F | Resp 12 | Ht 65.75 in | Wt 263.0 lb

## 2011-05-03 DIAGNOSIS — Z9884 Bariatric surgery status: Secondary | ICD-10-CM | POA: Diagnosis not present

## 2011-05-03 NOTE — Progress Notes (Signed)
[facility]  [name], MD,  FACS [address]  [address] [place], [place]    [address] Phone:  [phone] FAX:  [phone]  ASSESSMENT AND PLAN: 1. Morbid obesity.   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  She's down + 50 pounds since surgery.  Her mother is with her.  I will see her back in 3 months.  2. History of small strokes with patent foramen ovale.   Have been stable on aspirin.  She's on the 81 mg.  3. Patent foramen ovale. She cannot remember her cardiologist name. 4. Sleep apnea on CPAP.  5. Fibromyalgia - on disability.   She takes the Narco primarily for this. 6. Bilateral chronic knee pain. See's Dr. Tana Felts office.  She has had some shots of Syndisk in the knees.  She also takes Lodine for pain.    She's having a little more trouble with her knees.  She is blaming the recumbent bike she is using. 7. Hypothyroid, on replacement. This has been recently adjusted by Dr. Phillips Odor.  8. Disability.  9. Chronic pain medicine. On Narco.  10. Depression. ADHD. She says she saw someone who said she did not have ADHD, but she can not remember her name.   She has a flat affect. 11. Small HH on UGI. 12.  Memory issues - Sees Crystal [facility] in [place] for this. 13.  Hair loss.  We talked about the timing of this.   HISTORY OF PRESENT ILLNESS: Chief Complaint  Patient presents with  . Bariatric Follow Up    Debbie Pineda is a 51 y.o. (DOB: 1960-09-16)  white female who is a patient of Colette Ribas, MD, MD and comes to me today for follow-up for RYGB.  Her mother is with her.  She is concerned about loosing hair.  We talked about the timing of hair loss and making sure she is getting all her protein in.  She is disappointed that her wight is leveling off.  I told her to keep up her portion control, but this is normal.  She is having a little more knee trouble.  We talked about how to manage  this.  She is doing water aerobics and this helps.  She is taking Ca++ tabs to control her appetite, but I talked to her about alternatives.  We talked about a goal weight of 185 (around BMI 30, though this may be low).  PHYSICAL EXAM: BP 120/82  Pulse 60  Temp(Src) 97.4 F (36.3 C) (Oral)  Resp 12  Ht 5' 5.75" (1.67 m)  Wt 263 lb (119.296 kg)  BMI 42.77 kg/m2  General: WN WF who is alert and generally healthy appearing.  HEENT: Normal. Pupils equal. Good dentition. Lungs: Clear to auscultation and symmetric breath sounds. Heart:  RRR. No murmur or rub.  Abdomen: Soft. No mass. No tenderness. No hernia. Normal bowel sounds.  Abdominal incisions look good. Extremities:  Good strength and ROM  in upper and lower extremities. Neurologic:  Grossly intact to motor and sensory function. Psychiatric: Has normal mood and affect. Behavior is normal.   LABS: Her B12 is high.  She can back off the B12 she is taking.  Her other labs are okay.

## 2011-05-31 ENCOUNTER — Encounter: Payer: 59 | Attending: Surgery | Admitting: *Deleted

## 2011-05-31 ENCOUNTER — Encounter: Payer: Self-pay | Admitting: *Deleted

## 2011-05-31 VITALS — Ht 65.5 in | Wt 257.0 lb

## 2011-05-31 DIAGNOSIS — Z713 Dietary counseling and surveillance: Secondary | ICD-10-CM | POA: Insufficient documentation

## 2011-05-31 DIAGNOSIS — Z9884 Bariatric surgery status: Secondary | ICD-10-CM

## 2011-05-31 NOTE — Patient Instructions (Signed)
Goals:  Follow Phase 3B: High Protein + Non-Starchy Vegetables  OR alternatively Pre-Op Diet  Eat 3-6 small meals/snacks, every 3-5 hrs  Increase lean protein foods to meet 60-80g goal  Increase fluid intake to 64oz +  Avoid drinking 15 minutes before, during and 30 minutes after eating  Aim for >30 min of physical activity daily  Limit fruit to 1/2 cup a day; only a few days/week  Consider replacing 2-3 am snack with lean protein instead of carbs 

## 2011-05-31 NOTE — Progress Notes (Signed)
  Follow-up visit:  7 Months Post-Operative Gastric Bypass Surgery  Medical Nutrition Therapy:  Appt start time:  1030   End time:  1100  Assessment:  Primary concerns today: post-operative bariatric surgery nutrition management.  Debbie Pineda here today with daughter for f/u. Reports she has been "cheating" often; eating 1 cup of fruit at a sitting, etc. Discussed having 1/2 cup of fruit only 1 time/day a few days a week. Also recommended she change 3 am snack to protein source.   Surgery date: 11/20/10  Surgery type: Gastric Bypass  Weight goal: 175 lbs (Sz 12)  Start weight at [facility]: 321.8 lbs  Weight today: 256.1 lbs Weight change: 12.1 lbs Total weight lost: 65.7 lbs BMI: 42.1 kg/m^2  TANITA  BODY COMP RESULTS  05/31/11    %Fat 51.7%    FM (lbs) 133.0    FFM (lbs) 124.0    TBW (lbs) 91.0   24-hr recall:  B (8 AM): 1 egg w/ melted cheese OR 2 oz ham w/ 2 oz low-fat cheese Snk (AM): [place] yogurt OR Protein shake  L (12-1 PM): Romaine lettuce salad w/ tuna or chicken (2 cups) w/ lite dressing Snk (PM): 1 cheese stick OR EAS Protein drink D (6-7 PM): Lean meat (pork chop or chicken breast = 2-3 oz)  w/ green beans OR Chili Snk (8-10 PM): 8-10 strawberries in 1% milk (DISCUSSED REPLACING WITH PRO) Snk (3-4 AM):  [employer] lite yogurt and SF jello w/ fruit (DISCUSSED REPLACING WITH PRO)  Fluid intake: Crystal Light, water, protein shakes, decaf tea w/ splenda = 60-65 oz  Estimated total protein intake: ~75g  Medications: No changes reported Supplementation: Taking supplements regularly w/ no problems reported  Using straws: No Drinking while eating: Sips Hair loss: Yes - getting better with start of biotin 2000 mcg/day Carbonated beverages: no N/V/D/C: Occasional constipation uses Colace 2x/day to prevent Dumping syndrome: No  Recent physical activity:  3-5 days per week in the gym doing "Recumbent Cross Trainer" Classes @ 60 min   Samples dispensed:  Unjury Protein Powder  (Vanilla): 2 ea Lot # [id number]; Exp: 08/14  Progress Towards Goal(s):  In progress.   Nutritional Diagnosis:  [place]-3.3 Overweight/obesity As related to recent Gastric Bypass surgery.  As evidenced by pt following Gastric Bypass nutrition guidelines for continued weight loss.    Intervention:  Nutrition education.  Monitoring/Evaluation:  Dietary intake, exercise, and body weight. Follow up in 2 months for 9 month post-op visit.

## 2011-06-01 DIAGNOSIS — R269 Unspecified abnormalities of gait and mobility: Secondary | ICD-10-CM | POA: Diagnosis not present

## 2011-06-01 DIAGNOSIS — I6330 Cerebral infarction due to thrombosis of unspecified cerebral artery: Secondary | ICD-10-CM | POA: Diagnosis not present

## 2011-06-01 DIAGNOSIS — H8110 Benign paroxysmal vertigo, unspecified ear: Secondary | ICD-10-CM | POA: Diagnosis not present

## 2011-06-01 DIAGNOSIS — R413 Other amnesia: Secondary | ICD-10-CM | POA: Diagnosis not present

## 2011-06-25 DIAGNOSIS — R5381 Other malaise: Secondary | ICD-10-CM | POA: Diagnosis not present

## 2011-06-25 DIAGNOSIS — IMO0001 Reserved for inherently not codable concepts without codable children: Secondary | ICD-10-CM | POA: Diagnosis not present

## 2011-06-25 DIAGNOSIS — M76899 Other specified enthesopathies of unspecified lower limb, excluding foot: Secondary | ICD-10-CM | POA: Diagnosis not present

## 2011-06-25 DIAGNOSIS — M1710 Unilateral primary osteoarthritis, unspecified knee: Secondary | ICD-10-CM | POA: Diagnosis not present

## 2011-07-12 DIAGNOSIS — M1710 Unilateral primary osteoarthritis, unspecified knee: Secondary | ICD-10-CM | POA: Diagnosis not present

## 2011-07-12 DIAGNOSIS — IMO0002 Reserved for concepts with insufficient information to code with codable children: Secondary | ICD-10-CM | POA: Diagnosis not present

## 2011-07-20 ENCOUNTER — Ambulatory Visit (INDEPENDENT_AMBULATORY_CARE_PROVIDER_SITE_OTHER): Payer: 59 | Admitting: Surgery

## 2011-07-24 DIAGNOSIS — M1710 Unilateral primary osteoarthritis, unspecified knee: Secondary | ICD-10-CM | POA: Diagnosis not present

## 2011-07-24 DIAGNOSIS — IMO0002 Reserved for concepts with insufficient information to code with codable children: Secondary | ICD-10-CM | POA: Diagnosis not present

## 2011-08-01 DIAGNOSIS — IMO0002 Reserved for concepts with insufficient information to code with codable children: Secondary | ICD-10-CM | POA: Diagnosis not present

## 2011-08-01 DIAGNOSIS — M1710 Unilateral primary osteoarthritis, unspecified knee: Secondary | ICD-10-CM | POA: Diagnosis not present

## 2011-08-02 ENCOUNTER — Encounter: Payer: Self-pay | Admitting: *Deleted

## 2011-08-02 ENCOUNTER — Encounter: Payer: 59 | Attending: Surgery | Admitting: *Deleted

## 2011-08-02 DIAGNOSIS — Z713 Dietary counseling and surveillance: Secondary | ICD-10-CM | POA: Insufficient documentation

## 2011-08-02 NOTE — Patient Instructions (Signed)
Goals:  Follow Phase 3B: High Protein + Non-Starchy Vegetables  OR alternatively Pre-Op Diet  Eat 3-6 small meals/snacks, every 3-5 hrs  Increase lean protein foods to meet 60-80g goal  Increase fluid intake to 64oz +  Avoid drinking 15 minutes before, during and 30 minutes after eating  Aim for >30 min of physical activity daily  Limit fruit to 1/2 cup a day; only a few days/week  Consider replacing 2-3 am snack with lean protein instead of carbs

## 2011-08-02 NOTE — Progress Notes (Signed)
  Follow-up visit:  8-9 Months Post-Operative Gastric Bypass Surgery  Medical Nutrition Therapy:  Appt start time:  1000   End time:  1030  Assessment:  Primary concerns today: post-operative bariatric surgery nutrition management.  Debbie Pineda returns today for f/u. Increased pain allover today d/t fibromyalgia, which has kept her from exercising.  Feels her weight loss is slowing down; discussed other exercise options to get this back on track.   Surgery date: 11/20/10  Surgery type: Gastric Bypass  [facility] weight at [facility]: 321.8 lbs  Weight today: 248.5 lbs Weight change: 7.6 lbs Total weight lost: 73.3 lbs BMI: 40.7 kg/m^2  Weight goal: 175 lbs (Sz 12)  % goal met: 50%  TANITA  BODY COMP RESULTS  05/31/11 08/02/11    %Fat 51.7% 50.3%    FM (lbs) 133.0 125.0    FFM (lbs) 124.0 123.5    TBW (lbs) 91.0 90.5   24-hr recall: B (8 AM): 2 [place] weenies, colby/monterey jack cheese, yoplait 100 greek yogurt Snk (AM): None L (12-1 PM): [place] yogurt or protein shake (EAS) Snk (PM): [place] yogurt or protein shake (EAS) D (6-7 PM): Lean meat (pork chop or chicken breast = 2-3 oz)  w/ green beans OR Chili Snk (8-10 PM): yogurt (13g) OR fudgesicle (1-2)  Snk (3-4 AM):  [place] yogurt and SF jello w/ fruit (if available)  Fluid intake: Water, protein shakes, decaf tea w/ splenda = 90-100 oz  Estimated total protein intake: 85-100g  Medications: No changes reported Supplementation: Taking supplements regularly w/ no problems reported  Using straws: No Drinking while eating: Sips Hair loss: Some Carbonated beverages: no N/V/D/C: None - uses Colace 2x/day to prevent constipation Dumping syndrome: No  Recent physical activity:  Not exercising lately d/t vacation and pain from fibromyalgia  Progress Towards Goal(s):  In progress.   Nutritional Diagnosis:  [place]-3.3 Overweight/obesity As related to recent Gastric Bypass surgery.  As evidenced by pt following Gastric Bypass nutrition guidelines for  continued weight loss.    Intervention:  Nutrition education.  Monitoring/Evaluation:  Dietary intake, exercise, and body weight. Follow up in 3-4 months for 12 month post-op visit.

## 2011-08-08 DIAGNOSIS — M1710 Unilateral primary osteoarthritis, unspecified knee: Secondary | ICD-10-CM | POA: Diagnosis not present

## 2011-08-08 DIAGNOSIS — IMO0002 Reserved for concepts with insufficient information to code with codable children: Secondary | ICD-10-CM | POA: Diagnosis not present

## 2011-08-10 ENCOUNTER — Encounter (INDEPENDENT_AMBULATORY_CARE_PROVIDER_SITE_OTHER): Payer: Self-pay

## 2011-09-27 ENCOUNTER — Ambulatory Visit (INDEPENDENT_AMBULATORY_CARE_PROVIDER_SITE_OTHER): Payer: 59 | Admitting: Surgery

## 2011-10-24 ENCOUNTER — Other Ambulatory Visit (HOSPITAL_COMMUNITY): Payer: Self-pay | Admitting: Family Medicine

## 2011-10-24 ENCOUNTER — Ambulatory Visit (HOSPITAL_COMMUNITY)
Admission: RE | Admit: 2011-10-24 | Discharge: 2011-10-24 | Disposition: A | Discharge status: Home / Self Care | Payer: 59 | Source: Ambulatory Visit | Attending: Family Medicine | Admitting: Family Medicine

## 2011-10-24 DIAGNOSIS — Z8673 Personal history of transient ischemic attack (TIA), and cerebral infarction without residual deficits: Secondary | ICD-10-CM

## 2011-10-24 DIAGNOSIS — I8000 Phlebitis and thrombophlebitis of superficial vessels of unspecified lower extremity: Secondary | ICD-10-CM

## 2011-10-24 DIAGNOSIS — M79609 Pain in unspecified limb: Secondary | ICD-10-CM | POA: Insufficient documentation

## 2011-10-24 DIAGNOSIS — I82819 Embolism and thrombosis of superficial veins of unspecified lower extremities: Secondary | ICD-10-CM | POA: Insufficient documentation

## 2011-10-24 DIAGNOSIS — M7989 Other specified soft tissue disorders: Secondary | ICD-10-CM | POA: Insufficient documentation

## 2011-10-30 ENCOUNTER — Emergency Department (HOSPITAL_COMMUNITY): Payer: 59

## 2011-10-30 ENCOUNTER — Encounter (HOSPITAL_COMMUNITY): Payer: Self-pay | Admitting: *Deleted

## 2011-10-30 ENCOUNTER — Inpatient Hospital Stay (HOSPITAL_COMMUNITY)
Admission: EM | Admit: 2011-10-30 | Discharge: 2011-11-01 | DRG: 065 | Disposition: A | Discharge status: Home / Self Care | Payer: 59 | Attending: Internal Medicine | Admitting: Internal Medicine

## 2011-10-30 DIAGNOSIS — E860 Dehydration: Secondary | ICD-10-CM | POA: Diagnosis present

## 2011-10-30 DIAGNOSIS — F3289 Other specified depressive episodes: Secondary | ICD-10-CM | POA: Diagnosis present

## 2011-10-30 DIAGNOSIS — Z90710 Acquired absence of both cervix and uterus: Secondary | ICD-10-CM

## 2011-10-30 DIAGNOSIS — Z7982 Long term (current) use of aspirin: Secondary | ICD-10-CM

## 2011-10-30 DIAGNOSIS — Q2112 Patent foramen ovale: Secondary | ICD-10-CM

## 2011-10-30 DIAGNOSIS — Z9884 Bariatric surgery status: Secondary | ICD-10-CM

## 2011-10-30 DIAGNOSIS — E039 Hypothyroidism, unspecified: Secondary | ICD-10-CM | POA: Diagnosis not present

## 2011-10-30 DIAGNOSIS — Z6841 Body Mass Index (BMI) 40.0 and over, adult: Secondary | ICD-10-CM

## 2011-10-30 DIAGNOSIS — I498 Other specified cardiac arrhythmias: Secondary | ICD-10-CM | POA: Diagnosis not present

## 2011-10-30 DIAGNOSIS — Z23 Encounter for immunization: Secondary | ICD-10-CM

## 2011-10-30 DIAGNOSIS — IMO0001 Reserved for inherently not codable concepts without codable children: Secondary | ICD-10-CM | POA: Diagnosis not present

## 2011-10-30 DIAGNOSIS — G4733 Obstructive sleep apnea (adult) (pediatric): Secondary | ICD-10-CM | POA: Diagnosis present

## 2011-10-30 DIAGNOSIS — Z885 Allergy status to narcotic agent status: Secondary | ICD-10-CM

## 2011-10-30 DIAGNOSIS — I639 Cerebral infarction, unspecified: Secondary | ICD-10-CM

## 2011-10-30 DIAGNOSIS — I6350 Cerebral infarction due to unspecified occlusion or stenosis of unspecified cerebral artery: Principal | ICD-10-CM | POA: Diagnosis present

## 2011-10-30 DIAGNOSIS — F329 Major depressive disorder, single episode, unspecified: Secondary | ICD-10-CM | POA: Diagnosis present

## 2011-10-30 DIAGNOSIS — Z881 Allergy status to other antibiotic agents status: Secondary | ICD-10-CM

## 2011-10-30 DIAGNOSIS — Z8673 Personal history of transient ischemic attack (TIA), and cerebral infarction without residual deficits: Secondary | ICD-10-CM

## 2011-10-30 DIAGNOSIS — I809 Phlebitis and thrombophlebitis of unspecified site: Secondary | ICD-10-CM

## 2011-10-30 DIAGNOSIS — M797 Fibromyalgia: Secondary | ICD-10-CM

## 2011-10-30 DIAGNOSIS — IMO0002 Reserved for concepts with insufficient information to code with codable children: Secondary | ICD-10-CM | POA: Diagnosis present

## 2011-10-30 DIAGNOSIS — Z888 Allergy status to other drugs, medicaments and biological substances status: Secondary | ICD-10-CM

## 2011-10-30 DIAGNOSIS — F411 Generalized anxiety disorder: Secondary | ICD-10-CM | POA: Diagnosis present

## 2011-10-30 DIAGNOSIS — Z7901 Long term (current) use of anticoagulants: Secondary | ICD-10-CM

## 2011-10-30 DIAGNOSIS — Z87891 Personal history of nicotine dependence: Secondary | ICD-10-CM

## 2011-10-30 DIAGNOSIS — Z79899 Other long term (current) drug therapy: Secondary | ICD-10-CM

## 2011-10-30 DIAGNOSIS — M1990 Unspecified osteoarthritis, unspecified site: Secondary | ICD-10-CM

## 2011-10-30 DIAGNOSIS — Q211 Atrial septal defect: Secondary | ICD-10-CM

## 2011-10-30 DIAGNOSIS — Z9089 Acquired absence of other organs: Secondary | ICD-10-CM

## 2011-10-30 DIAGNOSIS — Z882 Allergy status to sulfonamides status: Secondary | ICD-10-CM

## 2011-10-30 DIAGNOSIS — G8929 Other chronic pain: Secondary | ICD-10-CM | POA: Diagnosis present

## 2011-10-30 DIAGNOSIS — I82819 Embolism and thrombosis of superficial veins of unspecified lower extremities: Secondary | ICD-10-CM | POA: Diagnosis present

## 2011-10-30 DIAGNOSIS — R079 Chest pain, unspecified: Secondary | ICD-10-CM | POA: Diagnosis present

## 2011-10-30 DIAGNOSIS — R001 Bradycardia, unspecified: Secondary | ICD-10-CM

## 2011-10-30 DIAGNOSIS — G4730 Sleep apnea, unspecified: Secondary | ICD-10-CM

## 2011-10-30 DIAGNOSIS — Q2111 Secundum atrial septal defect: Secondary | ICD-10-CM

## 2011-10-30 DIAGNOSIS — M1710 Unilateral primary osteoarthritis, unspecified knee: Secondary | ICD-10-CM | POA: Diagnosis present

## 2011-10-30 HISTORY — DX: Cerebral infarction, unspecified: I63.9

## 2011-10-30 LAB — CBC WITH DIFFERENTIAL/PLATELET
Basophils Absolute: 0 10*3/uL (ref 0.0–0.1)
Basophils Relative: 1 % (ref 0–1)
Eosinophils Absolute: 0.2 10*3/uL (ref 0.0–0.7)
Eosinophils Relative: 3 % (ref 0–5)
HCT: 40.1 % (ref 36.0–46.0)
Hemoglobin: 13.7 g/dL (ref 12.0–15.0)
Lymphocytes Relative: 40 % (ref 12–46)
Lymphs Abs: 2.9 10*3/uL (ref 0.7–4.0)
MCH: 32.2 pg (ref 26.0–34.0)
MCHC: 34.2 g/dL (ref 30.0–36.0)
MCV: 94.1 fL (ref 78.0–100.0)
Monocytes Absolute: 0.5 10*3/uL (ref 0.1–1.0)
Monocytes Relative: 7 % (ref 3–12)
Neutro Abs: 3.7 10*3/uL (ref 1.7–7.7)
Neutrophils Relative %: 49 % (ref 43–77)
Platelets: 226 10*3/uL (ref 150–400)
RBC: 4.26 MIL/uL (ref 3.87–5.11)
RDW: 12.6 % (ref 11.5–15.5)
WBC: 7.4 10*3/uL (ref 4.0–10.5)

## 2011-10-30 LAB — COMPREHENSIVE METABOLIC PANEL
ALT: 36 U/L — ABNORMAL HIGH (ref 0–35)
AST: 24 U/L (ref 0–37)
Albumin: 3.8 g/dL (ref 3.5–5.2)
Alkaline Phosphatase: 91 U/L (ref 39–117)
BUN: 25 mg/dL — ABNORMAL HIGH (ref 6–23)
CO2: 24 mEq/L (ref 19–32)
Calcium: 9.4 mg/dL (ref 8.4–10.5)
Chloride: 107 mEq/L (ref 96–112)
Creatinine, Ser: 1.02 mg/dL (ref 0.50–1.10)
GFR calc Af Amer: 74 mL/min — ABNORMAL LOW (ref >90)
GFR calc non Af Amer: 63 mL/min — ABNORMAL LOW (ref >90)
Glucose, Bld: 91 mg/dL (ref 70–99)
Potassium: 3.5 mEq/L (ref 3.5–5.1)
Sodium: 140 mEq/L (ref 135–145)
Total Bilirubin: 0.2 mg/dL — ABNORMAL LOW (ref 0.3–1.2)
Total Protein: 6.9 g/dL (ref 6.0–8.3)

## 2011-10-30 LAB — PROTIME-INR
INR: 1.23 (ref 0.00–1.49)
Prothrombin Time: 15.3 seconds — ABNORMAL HIGH (ref 11.6–15.2)

## 2011-10-30 LAB — TROPONIN I: Troponin I: <0.3 ng/mL (ref <0.30)

## 2011-10-30 NOTE — ED Provider Notes (Addendum)
History  This chart was scribed for Raeford Razor, MD by Bennett Scrape. This patient was seen in room APA14/APA14 and the patient's care was started at 8:49PM.  CSN: [id number]  Arrival date & time 10/30/11  2029   First MD Initiated Contact with Patient 10/30/11 2049      Chief Complaint  Patient presents with  . Headache     The history is provided by the patient and a relative. No language interpreter was used.    Debbie Pineda is a 51 y.o. female who presents to the Emergency Department complaining of 4 hours of sudden onset, non-changing, constant left-sided HA located in the parietal area with associated decreased peripheral vision to the right eye, memory problems described by family members as forgetting events from today and yesterday and confusion described by family members as taking longer than normal to respond. The HA is non-radiating and she denies any modifying factors. She denies vision changes in the left eye. She has a h/o 3 prior TIAs but denies having prior episodes of similar symptoms. She denies any chronic deficits from those TIAs. She was seen by [facility] for her follow up TIA care. She also c/o urinary urgency but denies SOB, CP, abdominal pain, numbness and weakness as associated symptoms. She is currently on coumadin to treat a DVT in the right leg diagnosed last week. She states that her INR level was 1.2 when it was measured 2 or 3 days ago. She has a h/o fibromyalgia, anemia and DJD. She is an occasional alcohol user and is a former smoker.  PCP is Dr. Phillips Odor, McGough is treating her for the DVT  Past Medical History  Diagnosis Date  . Sleep apnea   . Degenerative disk disease   . Degenerative joint disease   . Hypothyroid   . Hyperglycemia   . Morbid obesity   . Stroke   . Anemia   . Thrombophlebitis   . Fibromyalgia     Past Surgical History  Procedure Date  . Cholecystectomy   . Abdominal hysterectomy   . Knee surgery   .  Sclerosing of vein     Rt leg  . 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 Exploratory laparotomy     Family History  Problem Relation Age of Onset  . Cancer Father     brain  . Heart disease Father   . Heart disease Brother     History  Substance Use Topics  . Smoking status: Former Smoker    Quit date: 11/10/1990  . Smokeless tobacco: Never Used  . Alcohol Use: Yes     occ    No OB history provided.  Review of Systems  Eyes: Positive for visual disturbance. Negative for photophobia.  Respiratory: Negative for shortness of breath.   Cardiovascular: Negative for chest pain.  Gastrointestinal: Negative for abdominal pain.  Neurological: Positive for headaches. Negative for weakness and numbness.  All other systems reviewed and are negative.    Allergies  Flexeril; Morphine and related; Septra; and Sulfa antibiotics  Home Medications   Current Outpatient Rx  Name Route Sig Dispense Refill  . ARIPIPRAZOLE 5 MG PO TABS Oral Take 5 mg by mouth daily.    . ASPIRIN 81 MG PO TABS Oral Take 81 mg by mouth daily.     Marland Kitchen BIOTIN 1000 MCG PO TABS Oral Take 1,000 mcg by mouth 2 (two) times daily.     Marland Kitchen CALCIUM CITRATE + PO Oral Take 500 mg by  mouth 3 (three) times daily.      Marland Kitchen CITALOPRAM HYDROBROMIDE 40 MG PO TABS Oral Take 40 mg by mouth daily.    . COQ-10 PO Oral Take 1 capsule by mouth daily.    . B-12 1000 MCG SL SUBL Sublingual Place 1 tablet under the tongue daily.      Marland Kitchen DOCUSATE SODIUM 100 MG PO CAPS Oral Take 100 mg by mouth 2 (two) times daily.    . DULOXETINE HCL 60 MG PO CPEP Oral Take 60 mg by mouth daily.    . ETODOLAC 400 MG PO TABS Oral Take 400 mg by mouth daily.     Marland Kitchen HYDROCODONE-ACETAMINOPHEN 10-325 MG PO TABS Oral Take 1 tablet by mouth every 6 (six) hours as needed.      Marland Kitchen LEVOTHYROXINE SODIUM 150 MCG PO TABS Oral Take 150 mcg by mouth daily.    Marland Kitchen METHOCARBAMOL 500 MG PO TABS Oral Take 500 mg by mouth 4 (four) times daily.      Marland Kitchen ALZ-NAC PO Oral Take 1 tablet  by mouth daily.      . MULTIVITAMIN/IRON PO Oral Take 1 tablet by mouth 2 (two) times daily.      . TOPIRAMATE 100 MG PO TABS Oral Take 150 mg by mouth daily.     Marland Kitchen VITAMIN D (ERGOCALCIFEROL) 50000 UNITS PO CAPS Oral Take 50,000 Units by mouth every 7 (seven) days.      Marland Kitchen ZOLPIDEM TARTRATE 10 MG PO TABS Oral Take 10 mg by mouth at bedtime as needed.      Marland Kitchen ZOLPIDEM TARTRATE 3.5 MG SL SUBL Sublingual Place 1 tablet under the tongue at bedtime as needed.      Triage Vitals: BP 125/80  Pulse 66  Temp 98 F (36.7 C) (Oral)  Resp 18  Ht 5\' 5"  (1.651 m)  Wt 240 lb (108.863 kg)  BMI 39.94 kg/m2  SpO2 100%  Physical Exam  Nursing note and vitals reviewed. Constitutional: She appears well-developed and well-nourished.  HENT:  Head: Normocephalic and atraumatic.  Eyes: Conjunctivae normal and EOM are normal. Pupils are equal, round, and reactive to light.       No nystagmus, right sided hemianopsia   Neck: Normal range of motion. Neck supple.  Cardiovascular: Normal rate, regular rhythm and normal heart sounds.   Pulmonary/Chest: Effort normal and breath sounds normal.  Abdominal: Soft. Bowel sounds are normal.  Musculoskeletal: Normal range of motion.  Neurological: She is alert. No cranial nerve deficit.       No pronator's drift, normal finger to nose, responds appropriately but slow, unable to correctly identify the year  Skin: Skin is warm and dry.  Psychiatric: She has a normal mood and affect.    ED Course  Procedures (including critical care time)  DIAGNOSTIC STUDIES: Oxygen Saturation is 100% on room air , normal by my interpretation.    COORDINATION OF CARE: 9:19PM-Discussed treatment plan which includes a CT scan, consult with neurologist and admission with pt at bedside and pt agreed to plan.  9:30PM-Consult complete with on call neurologist, Dr. Arbutus Ped. Patient case explained and discussed. Dr. Arbutus Ped advises against transfer, because the pt is not a TPA candidate.  Call ended at 9:35PM.  9:45PM-Informed pt of negative CT scan and of consult with neurologist. Discussed treatment plan which includes admission for further testing (MRI, blood work, etc.) with pt at bedside and pt agreed to plan.  10:29PM-Consult complete with Dr. Orvan Falconer. Patient case explained and discussed. Dr.Campbell agrees  to admit patient for further evaluation and treatment. Call ended at 10:31PM.  Labs Reviewed  PROTIME-INR - Abnormal; Notable for the following:    Prothrombin Time 15.3 (*)     All other components within normal limits  COMPREHENSIVE METABOLIC PANEL - Abnormal; Notable for the following:    BUN 25 (*)     ALT 36 (*)     Total Bilirubin 0.2 (*)     GFR calc non Af Amer 63 (*)     GFR calc Af Amer 74 (*)     All other components within normal limits  CBC WITH DIFFERENTIAL   Ct Head Wo Contrast  10/30/2011  *RADIOLOGY REPORT*  Clinical Data: Headache.  Blurred vision.  CT HEAD WITHOUT CONTRAST  Technique:  Contiguous axial images were obtained from the base of the skull through the vertex without contrast.  Comparison: 07/01/2007  Findings: The brain stem, cerebellum, cerebral peduncles, thalami, basal ganglia, basilar cisterns, and ventricular system appear unremarkable.  No intracranial hemorrhage, mass lesion, or acute infarction is identified.  The visualized paranasal sinuses appear clear. The small chronic lacunar infarcts in the bilateral pica distribution shown on MRI are not well observed by CT.  IMPRESSION:  No significant abnormality identified.   Original Report Authenticated By: Dellia Cloud, M.D.    EKG:  Rhythm: sinus brady Vent. rate 54 BPM PR interval 190 ms QRS duration 92 ms QT/QTc 432/409 ms Axis: normal ST segments: NS ST changes inferiorly   1. Hypothyroid   2. Thrombophlebitis   3. Sleep apnea   4. Fibromyalgia   5. Degenerative joint disease   6. CVA (cerebral infarction)   7. Bradycardia       MDM  51yF with r sided  heminopsia. Consistent with CVA. Discussed with neurology. Not TPA candidate. Discussed with hospitalist for admit.  I personally preformed the services scribed in my presence. The recorded information has been reviewed and considered. Raeford Razor, MD.        Raeford Razor, MD 11/09/11 1714  Raeford Razor, MD 11/09/11 [phone]

## 2011-10-30 NOTE — ED Notes (Signed)
Headache, confusion , trouble with memory, vision problem, onset 5 30p,  Recent treatment for blood clot in leg and taking coumadin.

## 2011-10-30 NOTE — ED Notes (Signed)
Pt c/o rt side peripheral vision is not present.

## 2011-10-31 ENCOUNTER — Observation Stay (HOSPITAL_COMMUNITY): Payer: 59

## 2011-10-31 ENCOUNTER — Encounter (HOSPITAL_COMMUNITY): Payer: Self-pay

## 2011-10-31 ENCOUNTER — Inpatient Hospital Stay (HOSPITAL_COMMUNITY)
Admit: 2011-10-31 | Discharge: 2011-10-31 | Disposition: A | Discharge status: Home / Self Care | Payer: 59 | Attending: Neurology | Admitting: Neurology

## 2011-10-31 DIAGNOSIS — I6340 Cerebral infarction due to embolism of unspecified cerebral artery: Secondary | ICD-10-CM | POA: Diagnosis not present

## 2011-10-31 DIAGNOSIS — I82819 Embolism and thrombosis of superficial veins of unspecified lower extremities: Secondary | ICD-10-CM | POA: Diagnosis present

## 2011-10-31 DIAGNOSIS — M1990 Unspecified osteoarthritis, unspecified site: Secondary | ICD-10-CM | POA: Diagnosis not present

## 2011-10-31 DIAGNOSIS — I498 Other specified cardiac arrhythmias: Secondary | ICD-10-CM | POA: Diagnosis not present

## 2011-10-31 DIAGNOSIS — I6350 Cerebral infarction due to unspecified occlusion or stenosis of unspecified cerebral artery: Secondary | ICD-10-CM | POA: Diagnosis not present

## 2011-10-31 DIAGNOSIS — E860 Dehydration: Secondary | ICD-10-CM | POA: Diagnosis present

## 2011-10-31 DIAGNOSIS — Z6841 Body Mass Index (BMI) 40.0 and over, adult: Secondary | ICD-10-CM | POA: Diagnosis not present

## 2011-10-31 DIAGNOSIS — Z882 Allergy status to sulfonamides status: Secondary | ICD-10-CM | POA: Diagnosis not present

## 2011-10-31 DIAGNOSIS — G4733 Obstructive sleep apnea (adult) (pediatric): Secondary | ICD-10-CM | POA: Diagnosis present

## 2011-10-31 DIAGNOSIS — Z9884 Bariatric surgery status: Secondary | ICD-10-CM | POA: Diagnosis not present

## 2011-10-31 DIAGNOSIS — Z7901 Long term (current) use of anticoagulants: Secondary | ICD-10-CM | POA: Diagnosis not present

## 2011-10-31 DIAGNOSIS — Z87891 Personal history of nicotine dependence: Secondary | ICD-10-CM | POA: Diagnosis not present

## 2011-10-31 DIAGNOSIS — E039 Hypothyroidism, unspecified: Secondary | ICD-10-CM | POA: Diagnosis present

## 2011-10-31 DIAGNOSIS — M1710 Unilateral primary osteoarthritis, unspecified knee: Secondary | ICD-10-CM | POA: Diagnosis present

## 2011-10-31 DIAGNOSIS — G4730 Sleep apnea, unspecified: Secondary | ICD-10-CM | POA: Diagnosis not present

## 2011-10-31 DIAGNOSIS — G8929 Other chronic pain: Secondary | ICD-10-CM | POA: Diagnosis present

## 2011-10-31 DIAGNOSIS — Z23 Encounter for immunization: Secondary | ICD-10-CM | POA: Diagnosis not present

## 2011-10-31 DIAGNOSIS — R569 Unspecified convulsions: Secondary | ICD-10-CM | POA: Diagnosis not present

## 2011-10-31 DIAGNOSIS — IMO0002 Reserved for concepts with insufficient information to code with codable children: Secondary | ICD-10-CM | POA: Diagnosis present

## 2011-10-31 DIAGNOSIS — Z8673 Personal history of transient ischemic attack (TIA), and cerebral infarction without residual deficits: Secondary | ICD-10-CM | POA: Diagnosis not present

## 2011-10-31 DIAGNOSIS — Z90710 Acquired absence of both cervix and uterus: Secondary | ICD-10-CM | POA: Diagnosis not present

## 2011-10-31 DIAGNOSIS — R51 Headache: Secondary | ICD-10-CM | POA: Diagnosis not present

## 2011-10-31 DIAGNOSIS — Z9089 Acquired absence of other organs: Secondary | ICD-10-CM | POA: Diagnosis not present

## 2011-10-31 DIAGNOSIS — F3289 Other specified depressive episodes: Secondary | ICD-10-CM | POA: Diagnosis present

## 2011-10-31 DIAGNOSIS — Z7982 Long term (current) use of aspirin: Secondary | ICD-10-CM | POA: Diagnosis not present

## 2011-10-31 DIAGNOSIS — I6789 Other cerebrovascular disease: Secondary | ICD-10-CM | POA: Diagnosis not present

## 2011-10-31 DIAGNOSIS — Q2111 Secundum atrial septal defect: Secondary | ICD-10-CM | POA: Diagnosis not present

## 2011-10-31 DIAGNOSIS — IMO0001 Reserved for inherently not codable concepts without codable children: Secondary | ICD-10-CM | POA: Diagnosis present

## 2011-10-31 DIAGNOSIS — Z79899 Other long term (current) drug therapy: Secondary | ICD-10-CM | POA: Diagnosis not present

## 2011-10-31 DIAGNOSIS — F411 Generalized anxiety disorder: Secondary | ICD-10-CM | POA: Diagnosis present

## 2011-10-31 LAB — RAPID URINE DRUG SCREEN, HOSP PERFORMED
Amphetamines: NOT DETECTED
Barbiturates: NOT DETECTED
Benzodiazepines: NOT DETECTED
Cocaine: NOT DETECTED
Opiates: POSITIVE — AB
Tetrahydrocannabinol: NOT DETECTED

## 2011-10-31 LAB — URINALYSIS, ROUTINE W REFLEX MICROSCOPIC
Bilirubin Urine: NEGATIVE
Glucose, UA: NEGATIVE mg/dL
Hgb urine dipstick: NEGATIVE
Ketones, ur: NEGATIVE mg/dL
Leukocytes, UA: NEGATIVE
Nitrite: NEGATIVE
Protein, ur: NEGATIVE mg/dL
Specific Gravity, Urine: 1.02 (ref 1.005–1.030)
Urobilinogen, UA: 0.2 mg/dL (ref 0.0–1.0)
pH: 6 (ref 5.0–8.0)

## 2011-10-31 LAB — CBC
HCT: 38.2 % (ref 36.0–46.0)
HCT: 39 % (ref 36.0–46.0)
Hemoglobin: 13.2 g/dL (ref 12.0–15.0)
Hemoglobin: 13.2 g/dL (ref 12.0–15.0)
MCH: 32.1 pg (ref 26.0–34.0)
MCH: 31.7 pg (ref 26.0–34.0)
MCHC: 34.6 g/dL (ref 30.0–36.0)
MCHC: 33.8 g/dL (ref 30.0–36.0)
MCV: 92.9 fL (ref 78.0–100.0)
MCV: 93.8 fL (ref 78.0–100.0)
Platelets: 218 10*3/uL (ref 150–400)
Platelets: 207 10*3/uL (ref 150–400)
RBC: 4.11 MIL/uL (ref 3.87–5.11)
RBC: 4.16 MIL/uL (ref 3.87–5.11)
RDW: 12.3 % (ref 11.5–15.5)
RDW: 12.4 % (ref 11.5–15.5)
WBC: 7.1 10*3/uL (ref 4.0–10.5)
WBC: 6.6 10*3/uL (ref 4.0–10.5)

## 2011-10-31 LAB — BASIC METABOLIC PANEL
BUN: 18 mg/dL (ref 6–23)
CO2: 20 mEq/L (ref 19–32)
Calcium: 9.2 mg/dL (ref 8.4–10.5)
Chloride: 108 mEq/L (ref 96–112)
Creatinine, Ser: 0.74 mg/dL (ref 0.50–1.10)
GFR calc Af Amer: >90 mL/min (ref >90)
GFR calc non Af Amer: >90 mL/min (ref >90)
Glucose, Bld: 102 mg/dL — ABNORMAL HIGH (ref 70–99)
Potassium: 3.5 mEq/L (ref 3.5–5.1)
Sodium: 138 mEq/L (ref 135–145)

## 2011-10-31 LAB — HEMOGLOBIN A1C
Hgb A1c MFr Bld: 5.4 % (ref <5.7)
Mean Plasma Glucose: 108 mg/dL (ref <117)

## 2011-10-31 LAB — HEPARIN LEVEL (UNFRACTIONATED): Heparin Unfractionated: 0.24 IU/mL — ABNORMAL LOW (ref 0.30–0.70)

## 2011-10-31 LAB — LIPID PANEL
Cholesterol: 150 mg/dL (ref 0–200)
HDL: 58 mg/dL (ref >39)
LDL Cholesterol: 77 mg/dL (ref 0–99)
Total CHOL/HDL Ratio: 2.6 RATIO
Triglycerides: 75 mg/dL (ref <150)
VLDL: 15 mg/dL (ref 0–40)

## 2011-10-31 LAB — TROPONIN I
Troponin I: <0.3 ng/mL (ref <0.30)
Troponin I: <0.3 ng/mL (ref <0.30)

## 2011-10-31 LAB — T4, FREE: Free T4: 1.15 ng/dL (ref 0.80–1.80)

## 2011-10-31 LAB — RPR: RPR Ser Ql: NONREACTIVE

## 2011-10-31 LAB — HOMOCYSTEINE: Homocysteine: 6.5 umol/L (ref 4.0–15.4)

## 2011-10-31 LAB — VITAMIN B12: Vitamin B-12: 1535 pg/mL — ABNORMAL HIGH (ref 211–911)

## 2011-10-31 LAB — TSH: TSH: 3.328 u[IU]/mL (ref 0.350–4.500)

## 2011-10-31 MED ORDER — DULOXETINE HCL 60 MG PO CPEP
60.0000 mg | ORAL_CAPSULE | Freq: Every day | ORAL | Status: DC
  Administered 2011-10-31 – 2011-11-01 (×2): 60 mg via ORAL
  Filled 2011-10-31 (×2): qty 1

## 2011-10-31 MED ORDER — HEPARIN (PORCINE) IN NACL 100-0.45 UNIT/ML-% IJ SOLN
1000.0000 [IU]/h | INTRAMUSCULAR | Status: DC
  Administered 2011-10-31: 1000 [IU]/h via INTRAVENOUS
  Filled 2011-10-31: qty 250

## 2011-10-31 MED ORDER — CITALOPRAM HYDROBROMIDE 20 MG PO TABS
40.0000 mg | ORAL_TABLET | Freq: Every day | ORAL | Status: DC
Start: 2011-10-31 — End: 2011-11-01
  Administered 2011-10-31 (×2): 40 mg via ORAL
  Filled 2011-10-31: qty 1
  Filled 2011-10-31 (×2): qty 2

## 2011-10-31 MED ORDER — SODIUM CHLORIDE 0.9 % IJ SOLN
INTRAMUSCULAR | Status: AC
  Administered 2011-10-31: 3 mL
  Filled 2011-10-31: qty 3

## 2011-10-31 MED ORDER — INFLUENZA VIRUS VACC SPLIT PF IM SUSP
0.5000 mL | INTRAMUSCULAR | Status: AC
  Administered 2011-11-01: 0.5 mL via INTRAMUSCULAR
  Filled 2011-10-31: qty 0.5

## 2011-10-31 MED ORDER — ONDANSETRON HCL 4 MG/2ML IJ SOLN: 4.0000 mg | INTRAMUSCULAR | Status: DC | PRN

## 2011-10-31 MED ORDER — DOCUSATE SODIUM 100 MG PO CAPS
100.0000 mg | ORAL_CAPSULE | Freq: Two times a day (BID) | ORAL | Status: DC
  Administered 2011-10-31 – 2011-11-01 (×4): 100 mg via ORAL
  Filled 2011-10-31 (×4): qty 1

## 2011-10-31 MED ORDER — SODIUM CHLORIDE 0.9 % IV BOLUS (SEPSIS): 500.0000 mL | Freq: Once | INTRAVENOUS | Status: DC

## 2011-10-31 MED ORDER — ENOXAPARIN SODIUM 40 MG/0.4ML ~~LOC~~ SOLN
40.0000 mg | Freq: Every day | SUBCUTANEOUS | Status: DC
  Administered 2011-10-31 – 2011-11-01 (×2): 40 mg via SUBCUTANEOUS
  Filled 2011-10-31 (×2): qty 0.4

## 2011-10-31 MED ORDER — SODIUM CHLORIDE 0.9 % IV SOLN
INTRAVENOUS | Status: DC
  Filled 2011-10-31 (×2): qty 1000

## 2011-10-31 MED ORDER — ARIPIPRAZOLE 5 MG PO TABS
5.0000 mg | ORAL_TABLET | Freq: Every morning | ORAL | Status: DC
  Administered 2011-10-31 – 2011-11-01 (×2): 5 mg via ORAL
  Filled 2011-10-31 (×3): qty 1

## 2011-10-31 MED ORDER — METHOCARBAMOL 500 MG PO TABS
500.0000 mg | ORAL_TABLET | Freq: Four times a day (QID) | ORAL | Status: DC
  Administered 2011-10-31 – 2011-11-01 (×6): 500 mg via ORAL
  Filled 2011-10-31 (×6): qty 1

## 2011-10-31 MED ORDER — TOPIRAMATE 25 MG PO TABS
150.0000 mg | ORAL_TABLET | Freq: Every day | ORAL | Status: DC
  Administered 2011-10-31: 150 mg via ORAL
  Filled 2011-10-31 (×3): qty 2

## 2011-10-31 MED ORDER — ACETAMINOPHEN 325 MG PO TABS
650.0000 mg | ORAL_TABLET | ORAL | Status: DC | PRN
  Administered 2011-10-31 (×2): 650 mg via ORAL
  Filled 2011-10-31 (×2): qty 2

## 2011-10-31 MED ORDER — ASPIRIN 325 MG PO TABS
325.0000 mg | ORAL_TABLET | Freq: Every day | ORAL | Status: DC
  Administered 2011-10-31 – 2011-11-01 (×2): 325 mg via ORAL
  Filled 2011-10-31 (×2): qty 1

## 2011-10-31 MED ORDER — POTASSIUM CHLORIDE IN NACL 20-0.9 MEQ/L-% IV SOLN
INTRAVENOUS | Status: DC
  Administered 2011-10-31 – 2011-11-01 (×3): via INTRAVENOUS

## 2011-10-31 MED ORDER — TRAZODONE HCL 50 MG PO TABS
50.0000 mg | ORAL_TABLET | Freq: Every evening | ORAL | Status: DC | PRN
  Administered 2011-10-31: 50 mg via ORAL
  Filled 2011-10-31: qty 1

## 2011-10-31 MED ORDER — HYDROCODONE-ACETAMINOPHEN 5-325 MG PO TABS
1.0000 | ORAL_TABLET | ORAL | Status: DC | PRN
  Administered 2011-10-31: 2 via ORAL
  Administered 2011-10-31 (×2): 1 via ORAL
  Administered 2011-10-31 – 2011-11-01 (×2): 2 via ORAL
  Filled 2011-10-31: qty 1
  Filled 2011-10-31 (×3): qty 2
  Filled 2011-10-31: qty 1

## 2011-10-31 MED ORDER — FAMOTIDINE 20 MG PO TABS
20.0000 mg | ORAL_TABLET | Freq: Two times a day (BID) | ORAL | Status: DC
  Administered 2011-10-31 – 2011-11-01 (×4): 20 mg via ORAL
  Filled 2011-10-31 (×4): qty 1

## 2011-10-31 MED ORDER — LEVOTHYROXINE SODIUM 75 MCG PO TABS
150.0000 ug | ORAL_TABLET | Freq: Every day | ORAL | Status: DC
  Administered 2011-10-31 – 2011-11-01 (×2): 150 ug via ORAL
  Filled 2011-10-31 (×2): qty 2

## 2011-10-31 NOTE — Progress Notes (Signed)
ANTICOAGULATION CONSULT NOTE - Initial Consult  Pharmacy Consult for Heparin Indication: admitted with CVA; h/o PFO and Saphenous vein thrombosis  Allergies  Allergen Reactions  . Flexeril (Cyclobenzaprine Hcl) Other (See Comments)    Numbness of extremities  . Morphine And Related Itching    Upper torso  . Septra (Bactrim) Itching and Rash    Upper torso  . Sulfa Antibiotics Itching and Rash    Itching was upper torso    Patient Measurements: Height: 5\' 5"  (165.1 cm) Weight: 242 lb 8.1 oz (110 kg) IBW/kg (Calculated) : 57  Heparin Dosing Weight: 85 kg  Vital Signs: Temp: 97.6 F (36.4 C) (10/08 2340) Temp src: Oral (10/08 2340) BP: 95/39 mmHg (10/08 2340) Pulse Rate: 54  (10/08 2340)  Labs:  Basename 10/30/11 2327 10/30/11 2049  HGB -- 13.7  HCT -- 40.1  PLT -- 226  APTT -- --  LABPROT -- 15.3*  INR -- 1.23  HEPARINUNFRC -- --  CREATININE -- 1.02  CKTOTAL -- --  CKMB -- --  TROPONINI <0.30 --    Estimated Creatinine Clearance: 82.4 ml/min (by C-G formula based on Cr of 1.02).   Medical History: Past Medical History  Diagnosis Date  . Sleep apnea   . Degenerative disk disease   . Degenerative joint disease   . Hypothyroid   . Hyperglycemia   . Morbid obesity   . Stroke   . Anemia   . Thrombophlebitis   . Fibromyalgia    Medications:  Scheduled:    . ARIPiprazole  5 mg Oral q morning - 10a  . aspirin  325 mg Oral Daily  . citalopram  40 mg Oral QHS  . docusate sodium  100 mg Oral BID  . DULoxetine  60 mg Oral Daily  . famotidine  20 mg Oral BID  . influenza  inactive virus vaccine  0.5 mL Intramuscular Tomorrow-1000  . levothyroxine  150 mcg Oral QAC breakfast  . methocarbamol  500 mg Oral QID  . sodium chloride  500 mL Intravenous Once  . topiramate  150 mg Oral QHS    Assessment: Okay for Protocol, CT Head: No significant abnormality identified.  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  INR sub  therapeutic.  Goal of Therapy:  Heparin level 0.3-0.7 units/ml Monitor platelets by anticoagulation protocol: Yes   Plan:  No Bolus, drip at 1000 unit/hr (12 units/kg/hr - Heparin dosing weight). Heparin level in 6-8 hours. CBC and Heparin Level daily.  Lamonte Richer R 10/31/2011,12:44 AM

## 2011-10-31 NOTE — H&P (Signed)
[facility] History and Physical  Debbie Pineda  ZOX:[id number]  DOB: 07/09/1960   DOA: 10/30/2011   PCP:   Colette Ribas, MD  Neurologist: Hali Marry, MD, of [facility] in [facility] Rheumatologist: Dr. Corliss Skains Surgeon: [name] M.D.  Chief Complaint:  Confusion and left-sided headaches since about 5:30 PM today  HPI: Debbie Pineda is an 51 y.o. female.   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 She takes regular narcotics for fibromyalgia and osteoarthritic pains, and multiple psychotropic medications for fibromyalgia and depression. She denies diabetes hypertension or hyperlipidemia. 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  She was in baseline state of health until about 5:30 yesterday afternoon she complained of a pain behind her left eye and her daughter noted that she seemed a little confused with difficulty scribing her condition. Her daughter lives a Comptroller checked her for any evidence of focal weakness and found none, so they proceeded to an important meeting, but as her confusion got progressively worse she complained of difficulty seeing in the right-sided field of vision, she was brought to the [facility] emergency room at about 10 PM and after telephone consult with the neurohospitalist, assessed as having probable stroke, but was not a candidate for TPA because of being on warfarin.  She continues to have absence of any focal weakness, but has impairment of memory, difficulty describing her situation, and an incomplete right hemianopsia. Left retro-orbital pain persists.  No history of fever, cough, but she is now  complaining of a burning left-sided pain which goes from the left upper chest down to her lower abdomen, and gives a feeling as if she wants to pass urine.   Heart rate is in the 40s at rest, but increases into the 70s with agitation or movement .  Rewiew of Systems:   All systems negative except as marked bold or noted in the HPI;  Constitutional: Negative for malaise, fever and chills. ;  Eyes: Negative for eye pain, redness and discharge. ;  ENMT: Negative for ear pain, hoarseness, nasal congestion, sinus pressure and sore throat. ;  Cardiovascular: Negative for, palpitations, diaphoresis, dyspnea and peripheral edema. ;  Respiratory: Negative for cough, hemoptysis, wheezing and stridor. ;  Gastrointestinal: Negative for nausea, vomiting, diarrhea, constipation, melena, blood in stool, hematemesis, jaundice and rectal bleeding. unusual weight loss.. Admits to thirst Genitourinary: Negative for frequency, dysuria, incontinence,flank pain and hematuria; Musculoskeletal: Negative for back pain and neck pain. Negative for swelling and trauma.; osteoarthritis of the knees Skin: . Negative for pruritus, rash, abrasions, bruising and skin lesion.; ulcerations Neuro: Negative forlightheadedness and neck stiffness. Negative for weakness, altered level of consciousness , altered mental status, extremity weakness, burning feet, involuntary movement, seizure and syncope.  Psych: negative for anxiety, insomnia, tearfulness, panic attacks, hallucinations, paranoia, suicidal or homicidal ideation depression,   Past Medical History  Diagnosis Date  . Sleep apnea   . Degenerative disk disease   . Degenerative joint disease   . Hypothyroid   . Hyperglycemia   . Morbid obesity   . Stroke   . Anemia   . Thrombophlebitis   . Fibromyalgia     Past Surgical History  Procedure Date  .  Cholecystectomy   . Abdominal hysterectomy   . Knee surgery   . Sclerosing of vein     Rt leg  . Roux-en-y  gastric bypass 11/20/10  . Exploratory laparotomy     Medications:  HOME MEDS: Prior to Admission medications   Medication Sig Start Date End Date Taking? Authorizing Provider  ARIPiprazole (ABILIFY) 5 MG tablet Take 5 mg by mouth every morning.    Yes Historical Provider, MD  aspirin 81 MG tablet Take 81 mg by mouth daily.    Yes Historical Provider, MD  Biotin 1000 MCG tablet Take 2,000 mcg by mouth every evening.    Yes Historical Provider, MD  Calcium Citrate-Vitamin D (CALCIUM CITRATE + PO) Take 500 mg by mouth 3 (three) times daily. *Chewables*   Yes Historical Provider, MD  citalopram (CELEXA) 40 MG tablet Take 40 mg by mouth at bedtime.    Yes Historical Provider, MD  Coenzyme Q10 (COQ-10 PO) Take 1 capsule by mouth daily.   Yes Historical Provider, MD  Cyanocobalamin (B-12) 2500 MCG SUBL Place 1 tablet under the tongue 2 (two) times a week.   Yes Historical Provider, MD  docusate sodium (COLACE) 100 MG capsule Take 100 mg by mouth 2 (two) times daily.   Yes Historical Provider, MD  etodolac (LODINE) 400 MG tablet Take 400 mg by mouth every evening.    Yes Historical Provider, MD  HYDROcodone-acetaminophen (NORCO) 10-325 MG per tablet Take 1 tablet by mouth every 6 (six) hours as needed.     Yes Historical Provider, MD  HYDROcodone-acetaminophen (NORCO) 10-325 MG per tablet Take 1 tablet by mouth 4 (four) times daily as needed. For pain   Yes Historical Provider, MD  ibuprofen (ADVIL,MOTRIN) 200 MG tablet Take 600 mg by mouth daily as needed. For pain   Yes Historical Provider, MD  Boris Lown Oil 500 MG CAPS Take 1 capsule by mouth every evening.   Yes Historical Provider, MD  levothyroxine (SYNTHROID, LEVOTHROID) 150 MCG tablet Take 150 mcg by mouth every morning.    Yes Historical Provider, MD  methocarbamol (ROBAXIN) 500 MG tablet Take 500 mg by mouth 4 (four) times daily.     Yes Historical Provider, MD  topiramate (TOPAMAX) 100 MG tablet Take 150 mg by mouth at bedtime.    Yes  Historical Provider, MD  Vitamin D, Ergocalciferol, (DRISDOL) 50000 UNITS CAPS Take 50,000 Units by mouth every 7 (seven) days. *Taken on Mondays   Yes Historical Provider, MD  warfarin (COUMADIN) 5 MG tablet Take 5 mg by mouth daily. Taken at 1pm daily for 10 days 10/24/11  Yes Historical Provider, MD  zolpidem (AMBIEN) 10 MG tablet Take 10 mg by mouth at bedtime.    Yes Historical Provider, MD  cephALEXin (KEFLEX) 500 MG capsule Take 500 mg by mouth 4 (four) times daily. 10/19/11   Historical Provider, MD  DULoxetine (CYMBALTA) 60 MG capsule Take 60 mg by mouth daily.    Historical Provider, MD  Methylfol-Methylcob-Acetylcyst (ALZ-NAC PO) Take 1 tablet by mouth daily.      Historical Provider, MD  Multiple Vitamins-Iron (MULTIVITAMIN/IRON PO) Take 1 tablet by mouth 2 (two) times daily.      Historical Provider, MD  Zolpidem Tartrate (INTERMEZZO) 3.5 MG SUBL Place 1 tablet under the tongue at bedtime as needed.    Historical Provider, MD     Allergies:  Allergies  Allergen Reactions  . Flexeril (Cyclobenzaprine Hcl) Other (See Comments)    Numbness of extremities  . Morphine And Related  Itching    Upper torso  . Septra (Bactrim) Itching and Rash    Upper torso  . Sulfa Antibiotics Itching and Rash    Itching was upper torso    Social History:   reports that she quit smoking about 20 years ago. She has never used smokeless tobacco. She reports that she drinks alcohol. She reports that she does not use illicit drugs.  Family History: Family History  Problem Relation Age of Onset  . Cancer Father     brain  . Heart disease Father   . Heart disease Brother      Physical Exam: Filed Vitals:   10/30/11 2038 10/30/11 2206 10/30/11 2322 10/30/11 2340  BP: 125/80 121/80 110/65 95/39  Pulse: 66 48 58 54  Temp: 98 F (36.7 C)   97.6 F (36.4 C)  TempSrc: Oral   Oral  Resp: 18 16 16 18   Height: 5\' 5"  (1.651 m)     Weight: 108.863 kg (240 lb)   110 kg (242 lb 8.1 oz)  SpO2: 100%  100% 98% 100%   Blood pressure 95/39, pulse 54, temperature 97.6 F (36.4 C), temperature source Oral, resp. rate 18, height 5\' 5"  (1.651 m), weight 110 kg (242 lb 8.1 oz), SpO2 100.00%.  GEN:  Pleasant obese middle-aged lady  lying in the stretcher in no acute distress; cooperative with exam PSYCH:  alert and oriented x3; anxious, and repeatedly redirects towards the burning pain in her chest and abdomen; affect is  somewhat flat. HEENT: Mucous membranes pink, dry, and anicteric; PERRLA; EOM intact; no cervical lymphadenopathy nor thyromegaly or carotid bruit; no JVD; Breasts:: Not examined CHEST WALL: No tenderness CHEST: Normal respiration, clear to auscultation bilaterally HEART: Regular rate and rhythm; no murmurs rubs or gallops BACK: No kyphosis or scoliosis; no CVA tenderness ABDOMEN: Obese, soft non-tender; no masses, no organomegaly, normal abdominal bowel sounds; . Rectal Exam: Not done EXTREMITIES: No bone or joint deformity;  no tenderness; no edema; no ulcerations. Genitalia: not examined PULSES: 2+ and symmetric SKIN: Normal hydration no rash or ulceration CNS: some right-sided visual field defect; but not gross;   no focal lateralizing neurologic deficit; reflexes normal and symmetric    Labs on Admission:  Basic Metabolic Panel:  Lab 10/30/11 1308  NA 140  K 3.5  CL 107  CO2 24  GLUCOSE 91  BUN 25*  CREATININE 1.02  CALCIUM 9.4  MG --  PHOS --   Liver Function Tests:  Lab 10/30/11 2049  AST 24  ALT 36*  ALKPHOS 91  BILITOT 0.2*  PROT 6.9  ALBUMIN 3.8   No results found for this basename: LIPASE:5,AMYLASE:5 in the last 168 hours No results found for this basename: AMMONIA:5 in the last 168 hours CBC:  Lab 10/30/11 2049  WBC 7.4  NEUTROABS 3.7  HGB 13.7  HCT 40.1  MCV 94.1  PLT 226   Cardiac Enzymes:  Lab 10/30/11 2327  CKTOTAL --  CKMB --  CKMBINDEX --  TROPONINI <0.30   BNP: No components found with this basename:  POCBNP:5 D-dimer: No components found with this basename: D-DIMER:5 CBG: No results found for this basename: GLUCAP:5 in the last 168 hours  Radiological Exams on Admission: Ct Head Wo Contrast  10/30/2011  *RADIOLOGY REPORT*  Clinical Data: Headache.  Blurred vision.  CT HEAD WITHOUT CONTRAST  Technique:  Contiguous axial images were obtained from the base of the skull through the vertex without contrast.  Comparison: 07/01/2007  Findings: The brain  stem, cerebellum, cerebral peduncles, thalami, basal ganglia, basilar cisterns, and ventricular system appear unremarkable.  No intracranial hemorrhage, mass lesion, or acute infarction is identified.  The visualized paranasal sinuses appear clear. The small chronic lacunar infarcts in the bilateral pica distribution shown on MRI are not well observed by CT.  IMPRESSION:  No significant abnormality identified.   Original Report Authenticated By: Dellia Cloud, M.D.     EKG: Independently reviewed. Sinus bradycardia; no ST segment abnormalities.   Assessment/Plan Present on Admission:  .CVA (cerebral infarction) as evidenced by visual field defects, confusion and difficulty with word finding; possibly embolic given her history of superficial vein thrombosis, and PFO, with subtherapeutic INR;  differential diagnosis includes medication toxicity in the setting of dehydration.  Dehydration, possibly causing metabolic encephalopathy  .Morbid obesity - improving since bypass  .Hypothyroid - thyroid status unknown  .Thrombophlebitis .Sleep apnea - on CPAP  .Fibromyalgia - cause of chronic pains  .Degenerative joint disease -cause of chronic pains  .Bradycardia - possibly due to narcotic relative toxicity; possibly due to incompletely treated hypothyroidism  .Chest pain possibly due to anxiety    PLAN: we'll admit this lady on a stroke protocol, including hydration, will start a heparin drip since there is a possibility of embolism, and get  a 2-D echo with bubble study in the morning   MRI MRA and carotid Dopplers in the morning; will not do MRV despite the complaints of headache; will defer to neurology.  Check thyroid status and continue home regimen Continue CPAP After discussion with on-call neurologist, will continue psychotropic medications but withhold narcotics Will withhold Ambien Cycle cardiac enzymes are likely low yield  Other plans as per orders.  Code Status: FULL CODE  Family Communication:   Fayrene Fearing (Husband)  [phone] (c); Lourdes Sledge (daughter) [phone] (c) husband and daughter were present for interview and physical exam and assisted with a   Disposition Plan:  per neurology; if patient's MRI turns out to be negative patient can likely be discharged home with continued outpatient followup    Beautiful Pensyl Nocturnist [facility] Pager [phone]   10/31/2011, 12:24 AM

## 2011-10-31 NOTE — Consult Note (Signed)
[facility] Philander Ake A. Gerilyn Pilgrim, MD     [email or web address]        Reason for Consult: Referring Physician:   [name] is an 51 y.o. female.  HPI: The patient is a 51 year old white female who developed the acute onset of severe left occipital left temporal headache associated with confusion described as word finding problems and difficulty expressing what she wants to say. This was associated and is associated with loss of vision on the right side. She denies loss of consciousness, convulsive seizures, focal weakness or numbness. The patient tells me that she has had 2 episodes of stroke previously. She described that the episodes in the past occurred in about 2007 2009. She tenderness was diagnosed as having small strokes of that time. The spells are associated with disequilibrium and headache. She does report having a sensation of being off balance and disequilibrium during the current event. The current event has been going on now for several hours without much improvement. The headaches have fluctuated. The headache was early at a 8 now is down to a 3. The patient was diagnosed with a superficial thrombophlebitis and was placed on warfarin therapy about a week ago. She indicates to me that she has been on aspirin for a PFO diagnosed a few years ago due to her workup for her apparent stroke symptoms. She has been off the aspirin and placed on the warfarin for the past week.  Past Medical History  Diagnosis Date  . Sleep apnea   . Degenerative disk disease   . Degenerative joint disease   . Hypothyroid   . Hyperglycemia   . Morbid obesity   . Stroke   . Anemia   . Thrombophlebitis   . Fibromyalgia     Past Surgical History  Procedure Date  . Cholecystectomy   . Abdominal hysterectomy   . Knee surgery   . Sclerosing of vein     Rt leg  . 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 Exploratory laparotomy     Family History  Problem Relation Age of Onset  . Cancer  Father     brain  . Heart disease Father   . Heart disease Brother     Social History:  reports that she quit smoking about 20 years ago. She has never used smokeless tobacco. She reports that she drinks alcohol. She reports that she does not use illicit drugs.  Allergies:  Allergies  Allergen Reactions  . Flexeril (Cyclobenzaprine Hcl) Other (See Comments)    Numbness of extremities  . Morphine And Related Itching    Upper torso  . Septra (Bactrim) Itching and Rash    Upper torso  . Sulfa Antibiotics Itching and Rash    Itching was upper torso    Medications:  Prior to Admission medications   Medication Sig Start Date End Date Taking? Authorizing Provider  ARIPiprazole (ABILIFY) 5 MG tablet Take 5 mg by mouth every morning.    Yes Historical Provider, MD  aspirin 81 MG tablet Take 81 mg by mouth daily.    Yes Historical Provider, MD  Biotin 1000 MCG tablet Take 2,000 mcg by mouth every evening.    Yes Historical Provider, MD  Calcium Citrate-Vitamin D (CALCIUM CITRATE + PO) Take 500 mg by mouth 3 (three) times daily. *Chewables*   Yes Historical Provider, MD  citalopram (CELEXA) 40 MG tablet Take 40 mg by mouth at bedtime.    Yes Historical Provider, MD  Coenzyme Q10 (COQ-10 PO) Take 1  capsule by mouth daily.   Yes Historical Provider, MD  Cyanocobalamin (B-12) 2500 MCG SUBL Place 1 tablet under the tongue 2 (two) times a week.   Yes Historical Provider, MD  docusate sodium (COLACE) 100 MG capsule Take 100 mg by mouth 2 (two) times daily.   Yes Historical Provider, MD  etodolac (LODINE) 400 MG tablet Take 400 mg by mouth every evening.    Yes Historical Provider, MD  HYDROcodone-acetaminophen (NORCO) 10-325 MG per tablet Take 1 tablet by mouth every 6 (six) hours as needed.     Yes Historical Provider, MD  HYDROcodone-acetaminophen (NORCO) 10-325 MG per tablet Take 1 tablet by mouth 4 (four) times daily as needed. For pain   Yes Historical Provider, MD  ibuprofen (ADVIL,MOTRIN)  200 MG tablet Take 600 mg by mouth daily as needed. For pain   Yes Historical Provider, MD  Boris Lown Oil 500 MG CAPS Take 1 capsule by mouth every evening.   Yes Historical Provider, MD  levothyroxine (SYNTHROID, LEVOTHROID) 150 MCG tablet Take 150 mcg by mouth every morning.    Yes Historical Provider, MD  methocarbamol (ROBAXIN) 500 MG tablet Take 500 mg by mouth 4 (four) times daily.     Yes Historical Provider, MD  topiramate (TOPAMAX) 100 MG tablet Take 150 mg by mouth at bedtime.    Yes Historical Provider, MD  Vitamin D, Ergocalciferol, (DRISDOL) 50000 UNITS CAPS Take 50,000 Units by mouth every 7 (seven) days. *Taken on Mondays   Yes Historical Provider, MD  warfarin (COUMADIN) 5 MG tablet Take 5 mg by mouth daily. Taken at 1pm daily for 10 days 10/24/11  Yes Historical Provider, MD  zolpidem (AMBIEN) 10 MG tablet Take 10 mg by mouth at bedtime.    Yes Historical Provider, MD  cephALEXin (KEFLEX) 500 MG capsule Take 500 mg by mouth 4 (four) times daily. 10/19/11   Historical Provider, MD  DULoxetine (CYMBALTA) 60 MG capsule Take 60 mg by mouth daily.    Historical Provider, MD  Methylfol-Methylcob-Acetylcyst (ALZ-NAC PO) Take 1 tablet by mouth daily.      Historical Provider, MD  Multiple Vitamins-Iron (MULTIVITAMIN/IRON PO) Take 1 tablet by mouth 2 (two) times daily.      Historical Provider, MD  Zolpidem Tartrate (INTERMEZZO) 3.5 MG SUBL Place 1 tablet under the tongue at bedtime as needed.    Historical Provider, MD    Scheduled Meds:   . ARIPiprazole  5 mg Oral q morning - 10a  . aspirin  325 mg Oral Daily  . citalopram  40 mg Oral QHS  . docusate sodium  100 mg Oral BID  . DULoxetine  60 mg Oral Daily  . famotidine  20 mg Oral BID  . influenza  inactive virus vaccine  0.5 mL Intramuscular Tomorrow-1000  . levothyroxine  150 mcg Oral QAC breakfast  . methocarbamol  500 mg Oral QID  . sodium chloride  500 mL Intravenous Once  . topiramate  150 mg Oral QHS   Continuous Infusions:    . 0.9 % NaCl with KCl 20 mEq / L 100 mL/hr at 10/31/11 0153  . DISCONTD: heparin 1,000 Units/hr (10/31/11 0153)  . DISCONTD: sodium chloride 0.9 % 1,000 mL with potassium chloride 20 mEq infusion     PRN Meds:.acetaminophen, ondansetron (ZOFRAN) IV, traZODone   Results for orders placed during the hospital encounter of 10/30/11 (from the past 48 hour(s))  CBC WITH DIFFERENTIAL     Status: Normal   Collection Time   10/30/11  8:49 PM      Component Value Range Comment   WBC 7.4  4.0 - 10.5 K/uL    RBC 4.26  3.87 - 5.11 MIL/uL    Hemoglobin 13.7  12.0 - 15.0 g/dL    HCT 16.1  09.6 - 04.5 %    MCV 94.1  78.0 - 100.0 fL    MCH 32.2  26.0 - 34.0 pg    MCHC 34.2  30.0 - 36.0 g/dL    RDW 40.9  81.1 - 91.4 %    Platelets 226  150 - 400 K/uL    Neutrophils Relative 49  43 - 77 %    Neutro Abs 3.7  1.7 - 7.7 K/uL    Lymphocytes Relative 40  12 - 46 %    Lymphs Abs 2.9  0.7 - 4.0 K/uL    Monocytes Relative 7  3 - 12 %    Monocytes Absolute 0.5  0.1 - 1.0 K/uL    Eosinophils Relative 3  0 - 5 %    Eosinophils Absolute 0.2  0.0 - 0.7 K/uL    Basophils Relative 1  0 - 1 %    Basophils Absolute 0.0  0.0 - 0.1 K/uL   PROTIME-INR     Status: Abnormal   Collection Time   10/30/11  8:49 PM      Component Value Range Comment   Prothrombin Time 15.3 (*) 11.6 - 15.2 seconds    INR 1.23  0.00 - 1.49   COMPREHENSIVE METABOLIC PANEL     Status: Abnormal   Collection Time   10/30/11  8:49 PM      Component Value Range Comment   Sodium 140  135 - 145 mEq/L    Potassium 3.5  3.5 - 5.1 mEq/L    Chloride 107  96 - 112 mEq/L    CO2 24  19 - 32 mEq/L    Glucose, Bld 91  70 - 99 mg/dL    BUN 25 (*) 6 - 23 mg/dL    Creatinine, Ser 7.82  0.50 - 1.10 mg/dL    Calcium 9.4  8.4 - 95.6 mg/dL    Total Protein 6.9  6.0 - 8.3 g/dL    Albumin 3.8  3.5 - 5.2 g/dL    AST 24  0 - 37 U/L    ALT 36 (*) 0 - 35 U/L    Alkaline Phosphatase 91  39 - 117 U/L    Total Bilirubin 0.2 (*) 0.3 - 1.2 mg/dL    GFR calc  non Af Amer 63 (*) >90 mL/min    GFR calc Af Amer 74 (*) >90 mL/min   TROPONIN I     Status: Normal   Collection Time   10/30/11 11:27 PM      Component Value Range Comment   Troponin I <0.30  <0.30 ng/mL   URINALYSIS, ROUTINE W REFLEX MICROSCOPIC     Status: Normal   Collection Time   10/31/11  1:46 AM      Component Value Range Comment   Color, Urine YELLOW  YELLOW    APPearance CLEAR  CLEAR    Specific Gravity, Urine 1.020  1.005 - 1.030    pH 6.0  5.0 - 8.0    Glucose, UA NEGATIVE  NEGATIVE mg/dL    Hgb urine dipstick NEGATIVE  NEGATIVE    Bilirubin Urine NEGATIVE  NEGATIVE    Ketones, ur NEGATIVE  NEGATIVE mg/dL    Protein, ur NEGATIVE  NEGATIVE  mg/dL    Urobilinogen, UA 0.2  0.0 - 1.0 mg/dL    Nitrite NEGATIVE  NEGATIVE    Leukocytes, UA NEGATIVE  NEGATIVE MICROSCOPIC NOT DONE ON URINES WITH NEGATIVE PROTEIN, BLOOD, LEUKOCYTES, NITRITE, OR GLUCOSE <1000 mg/dL.  URINE RAPID DRUG SCREEN (HOSP PERFORMED)     Status: Abnormal   Collection Time   10/31/11  1:46 AM      Component Value Range Comment   Opiates POSITIVE (*) NONE DETECTED    Cocaine NONE DETECTED  NONE DETECTED    Benzodiazepines NONE DETECTED  NONE DETECTED    Amphetamines NONE DETECTED  NONE DETECTED    Tetrahydrocannabinol NONE DETECTED  NONE DETECTED    Barbiturates NONE DETECTED  NONE DETECTED   TROPONIN I     Status: Normal   Collection Time   10/31/11  5:26 AM      Component Value Range Comment   Troponin I <0.30  <0.30 ng/mL   BASIC METABOLIC PANEL     Status: Abnormal   Collection Time   10/31/11  5:26 AM      Component Value Range Comment   Sodium 138  135 - 145 mEq/L    Potassium 3.5  3.5 - 5.1 mEq/L    Chloride 108  96 - 112 mEq/L    CO2 20  19 - 32 mEq/L    Glucose, Bld 102 (*) 70 - 99 mg/dL    BUN 18  6 - 23 mg/dL    Creatinine, Ser 4.13  0.50 - 1.10 mg/dL    Calcium 9.2  8.4 - 24.4 mg/dL    GFR calc non Af Amer >90  >90 mL/min    GFR calc Af Amer >90  >90 mL/min   CBC     Status: Normal    Collection Time   10/31/11  5:26 AM      Component Value Range Comment   WBC 7.1  4.0 - 10.5 K/uL    RBC 4.11  3.87 - 5.11 MIL/uL    Hemoglobin 13.2  12.0 - 15.0 g/dL    HCT 01.0  27.2 - 53.6 %    MCV 92.9  78.0 - 100.0 fL    MCH 32.1  26.0 - 34.0 pg    MCHC 34.6  30.0 - 36.0 g/dL    RDW 64.4  03.4 - 74.2 %    Platelets 218  150 - 400 K/uL   LIPID PANEL     Status: Normal   Collection Time   10/31/11  5:26 AM      Component Value Range Comment   Cholesterol 150  0 - 200 mg/dL    Triglycerides 75  <595 mg/dL    HDL 58  >63 mg/dL    Total CHOL/HDL Ratio 2.6      VLDL 15  0 - 40 mg/dL    LDL Cholesterol 77  0 - 99 mg/dL   HEPARIN LEVEL (UNFRACTIONATED)     Status: Abnormal   Collection Time   10/31/11  8:29 AM      Component Value Range Comment   Heparin Unfractionated 0.24 (*) 0.30 - 0.70 IU/mL   CBC     Status: Normal   Collection Time   10/31/11  8:29 AM      Component Value Range Comment   WBC 6.6  4.0 - 10.5 K/uL    RBC 4.16  3.87 - 5.11 MIL/uL    Hemoglobin 13.2  12.0 - 15.0 g/dL    HCT  39.0  36.0 - 46.0 %    MCV 93.8  78.0 - 100.0 fL    MCH 31.7  26.0 - 34.0 pg    MCHC 33.8  30.0 - 36.0 g/dL    RDW 91.4  78.2 - 95.6 %    Platelets 207  150 - 400 K/uL     Ct Head Wo Contrast  10/30/2011  *RADIOLOGY REPORT*  Clinical Data: Headache.  Blurred vision.  CT HEAD WITHOUT CONTRAST  Technique:  Contiguous axial images were obtained from the base of the skull through the vertex without contrast.  Comparison: 07/01/2007  Findings: The brain stem, cerebellum, cerebral peduncles, thalami, basal ganglia, basilar cisterns, and ventricular system appear unremarkable.  No intracranial hemorrhage, mass lesion, or acute infarction is identified.  The visualized paranasal sinuses appear clear. The small chronic lacunar infarcts in the bilateral pica distribution shown on MRI are not well observed by CT.  IMPRESSION:  No significant abnormality identified.   Original Report Authenticated By:  Dellia Cloud, M.D.     Review of Systems  Constitutional: Negative.   HENT: Positive for neck pain.   Eyes: Negative.   Respiratory: Negative.   Cardiovascular: Negative.   Gastrointestinal: Negative.   Genitourinary: Negative.   Musculoskeletal: Positive for myalgias.  Neurological: Positive for headaches.  Endo/Heme/Allergies: Negative.    Blood pressure 96/38, pulse 45, temperature 98.3 F (36.8 C), temperature source Axillary, resp. rate 20, height 5\' 5"  (1.651 m), weight 110 kg (242 lb 8.1 oz), SpO2 99.00%. Physical Exam GENERAL: This is a pleasant obese lady who is in no acute distress. She appears to be in discomfort from headaches however.  HEENT: Retro-palatal space is fine. Head is atraumatic normocephalic. Neck is supple.   ABDOMEN: soft  EXTREMITIES: No edema   BACK: Unremarkable.  SKIN: Normal by inspection.    MENTAL STATUS: Alert and oriented. Speech, language and cognition are generally intact. Judgment and insight normal.   CRANIAL NERVES: Pupils are equal, round and reactive to light and accomodation; extra ocular movements are full, there is no significant nystagmus; upper and lower facial muscles are normal in strength and symmetric, there is no flattening of the nasolabial folds; tongue is midline; uvula is midline; shoulder elevation is normal. She has a right superior Homonymous quadrantanopia.  MOTOR: Normal tone, bulk and strength; no pronator drift.  COORDINATION: Left finger to nose is normal, right finger to nose is normal, No rest tremor; no intention tremor; no postural tremor; no bradykinesia.  REFLEXES: Deep tendon reflexes are symmetrical and normal. Babinski reflexes are flexor bilaterally.   SENSATION: Normal to light touch, temperature, and pinprick.  GAIT: Normal.    Assessment/Plan: 1. The patient's constellation of symptoms including a right superior Homonymous quadrantanopia, Left-sided headaches and aphasia/word finding  problem seems to indicate a left temporal process. I suspect that the most likely etiology is from an acute infarct not seen on CT scan. Other potential etiologies includes complex migraine although she does not have a history of migraine and complex partial seizures. Risk factors: History of PFO off aspirin,Obesity and a possible history of stroke in the past. I do not believe that the patient should be on heparin as this has not been shown to be beneficial in stroke prevention. I will therefore discontinue this and continue the aspirin. She does not represent an aspirin failure as she was off the medication for the past 7 days. A echo seems appropriate. Believe this has been ordered with a bubble study.  A brain MRI and MRA are pending. EEG will be obtained. All the labs includes homocysteine and vitamin B12 level.Patient may be a candidate for the PFO study this been done in by Dr. Garfield Cornea in [place].  Lawton Dollinger 10/31/2011, 9:50 AM

## 2011-10-31 NOTE — Progress Notes (Signed)
INITIAL ADULT NUTRITION ASSESSMENT Date: 10/31/2011   Time: 1:23 PM Reason for Assessment: Malnutrition Screen  ASSESSMENT: Female 51 y.o.  Dx: CVA (cerebral infarction)   Past Medical History  Diagnosis Date  . Sleep apnea   . Degenerative disk disease   . Degenerative joint disease   . Hypothyroid   . Hyperglycemia   . Morbid obesity   . Stroke   . Anemia   . Thrombophlebitis   . Fibromyalgia     Scheduled Meds:   . ARIPiprazole  5 mg Oral q morning - 10a  . aspirin  325 mg Oral Daily  . citalopram  40 mg Oral QHS  . docusate sodium  100 mg Oral BID  . DULoxetine  60 mg Oral Daily  . enoxaparin (LOVENOX) injection  40 mg Subcutaneous Daily  . famotidine  20 mg Oral BID  . influenza  inactive virus vaccine  0.5 mL Intramuscular Tomorrow-1000  . levothyroxine  150 mcg Oral QAC breakfast  . methocarbamol  500 mg Oral QID  . sodium chloride  500 mL Intravenous Once  . topiramate  150 mg Oral QHS   Continuous Infusions:   . 0.9 % NaCl with KCl 20 mEq / L 100 mL/hr at 10/31/11 0153  . DISCONTD: heparin Stopped (10/31/11 0930)  . DISCONTD: sodium chloride 0.9 % 1,000 mL with potassium chloride 20 mEq infusion     PRN Meds:.acetaminophen, HYDROcodone-acetaminophen, ondansetron (ZOFRAN) IV, traZODone  Ht: 5\' 5"  (165.1 cm)  Wt: 242 lb 8.1 oz (110 kg)  Ideal Wt: 57 kg  % Ideal Wt: 194%  Usual Wt:  Wt Readings from Last 10 Encounters:  10/30/11 242 lb 8.1 oz (110 kg)  08/02/11 248 lb 8 oz (112.719 kg)  05/31/11 257 lb (116.574 kg)  05/03/11 263 lb (119.296 kg)  03/26/11 268 lb 3.2 oz (121.655 kg)  02/15/11 274 lb 12.8 oz (124.648 kg)  01/26/11 282 lb 9.6 oz (128.187 kg)  12/07/10 300 lb (136.079 kg)  12/05/10 301 lb 6.4 oz (136.714 kg)  11/10/10 318 lb 4 oz (144.357 kg)     Body mass index is 40.36 kg/(m^2). Obesity Class III  Food/Nutrition Related Hx: Pt s/p CVA. Daughter is present. Hx of intentional  wt loss as noted above r/t gastric bypass 1 year  ago. Denies swallow difficulty. Feeds herself. Reports good appetite. She does not meet criteria for malnutrition.  CMP     Component Value Date/Time   NA 138 10/31/2011 0526   K 3.5 10/31/2011 0526   CL 108 10/31/2011 0526   CO2 20 10/31/2011 0526   GLUCOSE 102* 10/31/2011 0526   BUN 18 10/31/2011 0526   CREATININE 0.74 10/31/2011 0526   CREATININE 1.12* 01/26/2011 1448   CALCIUM 9.2 10/31/2011 0526   PROT 6.9 10/30/2011 2049   ALBUMIN 3.8 10/30/2011 2049   AST 24 10/30/2011 2049   ALT 36* 10/30/2011 2049   ALKPHOS 91 10/30/2011 2049   BILITOT 0.2* 10/30/2011 2049   GFRNONAA >90 10/31/2011 0526   GFRAA >90 10/31/2011 0526    Intake/Output Summary (Last 24 hours) at 10/31/11 1327 Last data filed at 10/31/11 1610  Gross per 24 hour  Intake      0 ml  Output    600 ml  Net   -600 ml    Diet Order: Cardiac  Supplements/Tube Feeding:none at this time.  IVF:    0.9 % NaCl with KCl 20 mEq / L Last Rate: 100 mL/hr at 10/31/11 0153  DISCONTD:  heparin Last Rate: Stopped (10/31/11 0930)  DISCONTD: sodium chloride 0.9 % 1,000 mL with potassium chloride 20 mEq infusion     Estimated Nutritional Needs:   Kcal:1400-1600 kcal  Protein:65-75 gr  Fluid:>1500 ml/day   NUTRITION DIAGNOSIS: None at this time  EDUCATION NEEDS: -Education needs addressed  INTERVENTION: -Heart Healthy diet per MD  Dietitian [phone]  DOCUMENTATION CODES Per approved criteria  -obesity Class III    Debbie Pineda 10/31/2011, 1:23 PM

## 2011-10-31 NOTE — Progress Notes (Signed)
[facility]             Progress Note   Subjective: Patient still has visual field deficits.  She feels that her difficulty with speech is somewhat better than yesterday.  She does not feel that she has any weakness in her extremities. She continues to have headache.  Objective: Vital signs in last 24 hours: Temp:  [97.5 F (36.4 C)-98.3 F (36.8 C)] 98.3 F (36.8 C) (10/09 0634) Pulse Rate:  [45-66] 45  (10/09 0634) Resp:  [16-20] 20  (10/09 0634) BP: (95-125)/(38-80) 96/38 mmHg (10/09 0634) SpO2:  [98 %-100 %] 99 % (10/09 0634) Weight:  [108.863 kg (240 lb)-110 kg (242 lb 8.1 oz)] 110 kg (242 lb 8.1 oz) (10/08 2340) Weight change:  Last BM Date: 10/29/11  Intake/Output from previous day: 10/08 0701 - 10/09 0700 In: 0  Out: 600 [Urine:600]     Physical Exam: General: Alert, awake, oriented x3, in no acute distress. HEENT: No bruits, no goiter. Heart: Regular rate and rhythm, without murmurs, rubs, gallops. Lungs: Clear to auscultation bilaterally. Abdomen: Soft, nontender, nondistended, positive bowel sounds. Extremities: No clubbing cyanosis or edema with positive pedal pulses. Neuro: strength is equal in upper and lower extremities, no facial asymmetry    Lab Results: Basic Metabolic Panel:  Basename 10/31/11 0526 10/30/11 2049  NA 138 140  K 3.5 3.5  CL 108 107  CO2 20 24  GLUCOSE 102* 91  BUN 18 25*  CREATININE 0.74 1.02  CALCIUM 9.2 9.4  MG -- --  PHOS -- --   Liver Function Tests:  [facility] 10/30/11 2049  AST 24  ALT 36*  ALKPHOS 91  BILITOT 0.2*  PROT 6.9  ALBUMIN 3.8   No results found for this basename: LIPASE:2,AMYLASE:2 in the last 72 hours No results found for this basename: AMMONIA:2 in the last 72 hours CBC:  Basename 10/31/11 0829 10/31/11 0526 10/30/11 2049  WBC 6.6 7.1 --  NEUTROABS -- -- 3.7  HGB 13.2 13.2 --  HCT 39.0 38.2 --  MCV 93.8 92.9 --  PLT 207 218 --   Cardiac Enzymes:  Basename 10/31/11 0526  10/30/11 2327  CKTOTAL -- --  CKMB -- --  CKMBINDEX -- --  TROPONINI <0.30 <0.30   BNP: No results found for this basename: PROBNP:3 in the last 72 hours D-Dimer: No results found for this basename: DDIMER:2 in the last 72 hours CBG: No results found for this basename: GLUCAP:6 in the last 72 hours Hemoglobin A1C: No results found for this basename: HGBA1C in the last 72 hours Fasting Lipid Panel:  Basename 10/31/11 0526  CHOL 150  HDL 58  LDLCALC 77  TRIG 75  CHOLHDL 2.6  LDLDIRECT --   Thyroid Function Tests: No results found for this basename: TSH,T4TOTAL,FREET4,T3FREE,THYROIDAB in the last 72 hours Anemia Panel: No results found for this basename: VITAMINB12,FOLATE,FERRITIN,TIBC,IRON,RETICCTPCT in the last 72 hours Coagulation:  Basename 10/30/11 2049  LABPROT 15.3*  INR 1.23   Urine Drug Screen: Drugs of Abuse     Component Value Date/Time   LABOPIA POSITIVE* 10/31/2011 0146   COCAINSCRNUR NONE DETECTED 10/31/2011 0146   LABBENZ NONE DETECTED 10/31/2011 0146   AMPHETMU NONE DETECTED 10/31/2011 0146   THCU NONE DETECTED 10/31/2011 0146   LABBARB NONE DETECTED 10/31/2011 0146    Alcohol Level: No results found for this basename: ETH:2 in the last 72 hours Urinalysis:  Basename 10/31/11 0146  COLORURINE YELLOW  LABSPEC 1.020  PHURINE 6.0  GLUCOSEU NEGATIVE  HGBUR NEGATIVE  BILIRUBINUR NEGATIVE  KETONESUR NEGATIVE  PROTEINUR NEGATIVE  UROBILINOGEN 0.2  NITRITE NEGATIVE  LEUKOCYTESUR NEGATIVE   Misc. Labs:  No results found for this or any previous visit (from the past 240 hour(s)).  Studies/Results: Ct Head Wo Contrast  10/30/2011  *RADIOLOGY REPORT*  Clinical Data: Headache.  Blurred vision.  CT HEAD WITHOUT CONTRAST  Technique:  Contiguous axial images were obtained from the base of the skull through the vertex without contrast.  Comparison: 07/01/2007  Findings: The brain stem, cerebellum, cerebral peduncles, thalami, basal ganglia, basilar cisterns,  and ventricular system appear unremarkable.  No intracranial hemorrhage, mass lesion, or acute infarction is identified.  The visualized paranasal sinuses appear clear. The small chronic lacunar infarcts in the bilateral pica distribution shown on MRI are not well observed by CT.  IMPRESSION:  No significant abnormality identified.   Original Report Authenticated By: Dellia Cloud, M.D.     Medications: Scheduled Meds:   . ARIPiprazole  5 mg Oral q morning - 10a  . aspirin  325 mg Oral Daily  . citalopram  40 mg Oral QHS  . docusate sodium  100 mg Oral BID  . DULoxetine  60 mg Oral Daily  . famotidine  20 mg Oral BID  . influenza  inactive virus vaccine  0.5 mL Intramuscular Tomorrow-1000  . levothyroxine  150 mcg Oral QAC breakfast  . methocarbamol  500 mg Oral QID  . sodium chloride  500 mL Intravenous Once  . topiramate  150 mg Oral QHS   Continuous Infusions:   . 0.9 % NaCl with KCl 20 mEq / L 100 mL/hr at 10/31/11 0153  . DISCONTD: heparin 1,000 Units/hr (10/31/11 0153)  . 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 CVA.  Clinically, it appears that patient has had a stroke.  MRI/MRA of the brain, carotid dopplers and echo are pending.  We will discontinue heparin infusion due to risk of hemorrhagic conversion of infarct. Neurology is following the patient. She was taking aspirin prior to admission, but this was discontinued to start anticoagulation.  Will restart aspirin.  2. Recent superficial thrombosis of right greater saphenous vein.  Since this is not a deep venous thrombosis,  anticoagulation is not recommended.  We will not resume anticoagulation.   3. Bradycardia.  May be related to hypothyroid, may also be related to sleep apnea. Does not seem to be clinically symptomatic.  4. Dehydration, on IV fluids  5. OSA. On CPAP  6. Hypothyroid.  Follow up TSH, on replacement therapy   Time spent coordinating care:   LOS: 1 day   MEMON,JEHANZEB [facility] Pager: [phone] 10/31/2011, 9:50 AM

## 2011-10-31 NOTE — Care Management Note (Signed)
    Page 1 of 1   11/01/2011     11:15:26 AM   CARE MANAGEMENT NOTE 11/01/2011  Patient:  Debbie Pineda, Debbie Pineda   Account Number:  [id number]  Date Initiated:  10/31/2011  Documentation initiated by:  Rosemary Holms  Subjective/Objective Assessment:   Pt admitted from home where she lives with spouse. Spoke with pt, spouse and daughter. Admitted to R/O CVA. Per pt and family, no HH needs anticipated     Action/Plan:   CM to follow for possible therapy needs   Anticipated DC Date:  11/01/2011   Anticipated DC Plan:  HOME W HOME HEALTH SERVICES         Choice offered to / List presented to:             Status of service:  Completed, signed off Medicare Important Message given?   (If response is "NO", the following Medicare IM given date fields will be blank) Date Medicare IM given:   Date Additional Medicare IM given:    Discharge Disposition:  HOME/SELF CARE  Per UR Regulation:    If discussed at Long Length of Stay Meetings, dates discussed:    Comments:  10/31/11 1100 Siddhant Hashemi Leanord Hawking RN BSN CM

## 2011-10-31 NOTE — Evaluation (Signed)
Physical Therapy Evaluation Patient Details Name: Debbie Pineda MRN: [id number] DOB: Sep 29, 1960 Today's Date: 10/31/2011 Time: 4098-1191 PT Time Calculation (min): 22 min  PT Assessment / Plan / Recommendation Clinical Impression  Pt was seen for a limited eval.  She has not yet had her MRI and per stroke protocol, we don't usually work with pts earlier than 24 hours post sx. (5:00 PM last night).  Pt still reports having a HA and R upper visual field limitation.  Otherwise, no problems were seen in strength , bed mobility or limited gait in her room.    I will see her again tomorrow to more thoroughly check her dynamic balance, but don't anticipate seeing any problems.       PT Assessment  Patient needs continued PT services    Follow Up Recommendations  No PT follow up    Does the patient have the potential to tolerate intense rehabilitation      Barriers to Discharge None      Equipment Recommendations  None recommended by PT    Recommendations for Other Services     Frequency Min 3X/week    Precautions / Restrictions Precautions Precautions: Other (comment) Precaution Comments: Pt is not yet 24 hours post possible stroke...per protocol, we don't normally challenge these pts while OOB Restrictions Weight Bearing Restrictions: No   Pertinent Vitals/Pain       Mobility  Bed Mobility Bed Mobility: Sit to Supine;Supine to Sit Sit to Supine: 7: Independent Details for Bed Mobility Assistance: Pt was in bathroom at time of my arrival. She was observed while walking back to bed Transfers Transfers: Sit to Stand;Stand to Sit Sit to Stand: 7: Independent Stand to Sit: 7: Independent Ambulation/Gait Ambulation/Gait Assistance: 7: Independent Ambulation Distance (Feet): 12 Feet Assistive device: None Gait Pattern: Within Functional Limits Stairs: No Wheelchair Mobility Wheelchair Mobility: No Modified Rankin (Stroke Patients Only) Pre-Morbid Rankin Score: No  symptoms Modified Rankin: No significant disability    Shoulder Instructions     Exercises     PT Diagnosis: Other (comment) (eval not yet completed)  PT Problem List: Pain;Other (comment) (decreased vision) PT Treatment Interventions: Other (comment) (complete eval)   PT Goals    Visit Information  Last PT Received On: 10/31/11    Subjective Data  Subjective: I'm feeling better Patient Stated Goal: none stated   Prior Functioning  Home Living Lives With: Spouse Available Help at Discharge: Family Type of Home: House Home Access: Stairs to enter Secretary/administrator of Steps: 2 Entrance Stairs-Rails: Right Home Layout: One level Home Adaptive Equipment: Walker - rolling Prior Function Level of Independence: Independent Driving: Yes Vocation: On disability Comments: disability due to fibromyalgia Communication Communication: No difficulties    Cognition  Overall Cognitive Status: Appears within functional limits for tasks assessed/performed Arousal/Alertness: Awake/alert Orientation Level: Appears intact for tasks assessed Behavior During Session: [facility] for tasks performed    Extremity/Trunk Assessment Right Lower Extremity Assessment RLE ROM/Strength/Tone: Within functional levels RLE Sensation: WFL - Light Touch RLE Coordination: WFL - gross motor Left Lower Extremity Assessment LLE ROM/Strength/Tone: Within functional levels LLE Sensation: WFL - Light Touch LLE Coordination: WFL - gross motor Trunk Assessment Trunk Assessment: Normal   Balance Balance Balance Assessed:  (no problems observed)  End of Session PT - End of Session Activity Tolerance: Patient tolerated treatment well Patient left: in bed;with call bell/phone within reach;with nursing in room;with family/visitor present Nurse Communication: Mobility status  GP     Konrad Penta 10/31/2011, 11:21  AM

## 2011-10-31 NOTE — Progress Notes (Signed)
*  PRELIMINARY RESULTS* Echocardiogram 2D Echocardiogram has been performed.  Conrad [place] 10/31/2011, 2:28 PM

## 2011-10-31 NOTE — Progress Notes (Signed)
Final troponin delayed due to patient being OTF for testing.

## 2011-10-31 NOTE — Progress Notes (Signed)
Occupational Therapy Screen  OT orders received. Pt's chart reviewed. Spoke with PT regarding patient's performance during eval. PT stated that patient is not showing any difficulty with strength, bed mobility and limited gait. Spoke with patient who stated that her only difficulty was not being able to see out of the right upper quadrant of her right eye. Provided patient with information regarding a field cut after stroke and recommended that she see a OD for a visual field test. Also, recommended exercises to perform for a visual field cut. Besides her vision, pt does not express any concern with performing BADL at home.  Limmie Patricia, OTR/L 10/31/11 3:15PM

## 2011-10-31 NOTE — Progress Notes (Signed)
UR Chart Review Completed  

## 2011-11-01 DIAGNOSIS — I6340 Cerebral infarction due to embolism of unspecified cerebral artery: Secondary | ICD-10-CM | POA: Diagnosis not present

## 2011-11-01 DIAGNOSIS — G4730 Sleep apnea, unspecified: Secondary | ICD-10-CM | POA: Diagnosis not present

## 2011-11-01 DIAGNOSIS — R569 Unspecified convulsions: Secondary | ICD-10-CM | POA: Diagnosis not present

## 2011-11-01 DIAGNOSIS — I498 Other specified cardiac arrhythmias: Secondary | ICD-10-CM | POA: Diagnosis not present

## 2011-11-01 DIAGNOSIS — R51 Headache: Secondary | ICD-10-CM | POA: Diagnosis not present

## 2011-11-01 DIAGNOSIS — I6350 Cerebral infarction due to unspecified occlusion or stenosis of unspecified cerebral artery: Secondary | ICD-10-CM | POA: Diagnosis not present

## 2011-11-01 DIAGNOSIS — E039 Hypothyroidism, unspecified: Secondary | ICD-10-CM | POA: Diagnosis not present

## 2011-11-01 LAB — CBC
HCT: 38.4 % (ref 36.0–46.0)
Hemoglobin: 13.1 g/dL (ref 12.0–15.0)
MCH: 32.2 pg (ref 26.0–34.0)
MCHC: 34.1 g/dL (ref 30.0–36.0)
MCV: 94.3 fL (ref 78.0–100.0)
Platelets: 212 10*3/uL (ref 150–400)
RBC: 4.07 MIL/uL (ref 3.87–5.11)
RDW: 12.6 % (ref 11.5–15.5)
WBC: 7.3 10*3/uL (ref 4.0–10.5)

## 2011-11-01 LAB — BASIC METABOLIC PANEL
BUN: 15 mg/dL (ref 6–23)
CO2: 21 mEq/L (ref 19–32)
Calcium: 8.7 mg/dL (ref 8.4–10.5)
Chloride: 110 mEq/L (ref 96–112)
Creatinine, Ser: 0.83 mg/dL (ref 0.50–1.10)
GFR calc Af Amer: >90 mL/min (ref >90)
GFR calc non Af Amer: 81 mL/min — ABNORMAL LOW (ref >90)
Glucose, Bld: 96 mg/dL (ref 70–99)
Potassium: 4.1 mEq/L (ref 3.5–5.1)
Sodium: 140 mEq/L (ref 135–145)

## 2011-11-01 MED ORDER — OXYCODONE-ACETAMINOPHEN 5-325 MG PO TABS: 1.0000 | ORAL_TABLET | ORAL | Status: DC | PRN

## 2011-11-01 MED ORDER — ASPIRIN EC 325 MG PO TBEC: 325.0000 mg | DELAYED_RELEASE_TABLET | Freq: Every day | ORAL | Status: DC

## 2011-11-01 MED ORDER — OXYCODONE-ACETAMINOPHEN 5-325 MG PO TABS
1.0000 | ORAL_TABLET | ORAL | Status: DC | PRN
  Administered 2011-11-01 (×2): 2 via ORAL
  Filled 2011-11-01 (×2): qty 2

## 2011-11-01 MED ORDER — PANTOPRAZOLE SODIUM 40 MG PO TBEC: 40.0000 mg | DELAYED_RELEASE_TABLET | Freq: Every day | ORAL | Status: DC

## 2011-11-01 NOTE — Discharge Summary (Signed)
Physician Discharge Summary  Debbie Pineda OZH:[id number] DOB: 1960-02-16 DOA: 10/30/2011  PCP: Colette Ribas, MD  Admit date: 10/30/2011 Discharge date: 11/01/2011  Recommendations for Outpatient Follow-up:  1. Patient will follow up with primary neurology group Hali Marry, [place] at [facility] 2. 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 female. 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 She takes regular narcotics for fibromyalgia and osteoarthritic pains, and multiple psychotropic medications for fibromyalgia and depression. She denies diabetes hypertension or hyperlipidemia. 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  She was in baseline state of health until about 5:30 yesterday afternoon she complained of a pain behind her left eye and her daughter noted that she seemed a little confused with difficulty scribing her condition. Her daughter lives a Comptroller checked her for any evidence of focal weakness and found none, so they proceeded to an important meeting, but as her confusion got progressively worse she complained of  difficulty seeing in the right-sided field of vision, she was brought to the [facility] emergency room at about 10 PM and after telephone consult with the neurohospitalist, assessed as having probable stroke, but was not a candidate for TPA because of being on warfarin.  She continues to have absence of any focal weakness, but has impairment of memory, difficulty describing her situation, and an incomplete right hemianopsia. Left retro-orbital pain persists.  No history of fever, cough, but she is now complaining of a burning left-sided pain which goes from the left upper chest down to her lower abdomen, and gives a feeling as if she wants to pass urine.  Heart rate is in the 40s at rest, but increases into the 70s with agitation or movement    Hospital Course:  This lady was admitted to the hospital with complaints of difficulty with speech and incomplete right hemianopsia. She was diagnosed to have a clinical stroke. Unfortunately she was not a candidate for TPA since she was taking warfarin. She was admitted to the hospital for further evaluation and treatment. She was started on aspirin. MRI of the brain confirmed an acute infarct in the left posterior cerebral artery territory involving the posterior medial temporal lobe and medial occipital lobe. MRA of the brain confirmed an occluded left posterior cerebral artery compatible with acute infarct. Patient was seen by neurology and since patient had been off of aspirin since she was started on Coumadin, it was recommended that patient be restarted on aspirin. Her lipid profile was in an acceptable range, blood pressure is controlled. Carotid Doppler does not show a significant stenosis, 2-D echo with bubble study was also unremarkable. Patient was diagnosed with a patent  foreman ovale in the past on transesophageal echo. On the current 2-D echo with bubble study, there was no mention of PFO. Regarding the patient's Coumadin, venous ultrasound of the lower  extremity was reviewed which indicated a superficial venous thrombus. For a superficial venous thrombus, anticoagulation is not indicated. Her Coumadin has been discontinued. Patient was also noted to be somewhat bradycardic. This was felt to be possibly due to narcotics versus uncontrolled sleep apnea. She may need CPAP titration as an outpatient. TSH was found to be normal. Patient has been cleared by neurology to discharge home. She will receive a CD with her MRI studies to followup with her primary neurologist. She is recommended to followup with her primary care physician in 2 weeks. She was seen by both physical therapy and occupational therapy and no specific followup is recommended.  Procedures:  2D echo with bubble study done shows ejection fraction 55-60%, without mention of cardiac thrombus or PFO  Consultations:  Neurology, Dr. Gerilyn Pilgrim  Discharge Exam: Filed Vitals:   11/01/11 0207 11/01/11 0550 11/01/11 0721 11/01/11 1038  BP: 90/57 92/59  97/57  Pulse: 47 53  51  Temp: 97.9 F (36.6 C) 97.9 F (36.6 C)  97.9 F (36.6 C)  TempSrc: Oral Oral  Oral  Resp: 18 19  18   Height:      Weight:      SpO2: 98% 99% 99% 100%    General: NAD Cardiovascular: s1, s2, rrr Respiratory: cta b  Discharge Instructions  Discharge Orders    Future Appointments: Provider: Department: Dept Phone: Center:   11/08/2011 8:00 AM Kandis Cocking, MD [facility] [phone] None   11/20/2011 3:00 PM Orvil Feil Himmelrich, MS, RD [facility] [phone] NDM     Future Orders Please Complete By Expires   Diet - low sodium heart healthy      Increase activity slowly      Call MD for:  severe uncontrolled pain      Driving Restrictions      Comments:   No driving until cleared by neurologist.   Call MD for:      Comments:   Unilateral weakness/numbness       Medication List     As of 11/01/2011 11:58 AM    STOP taking these medications         aspirin 81 MG  tablet      warfarin 5 MG tablet   Commonly known as: COUMADIN      TAKE these medications         ALZ-NAC PO   Take 1 tablet by mouth daily.      AMBIEN 10 MG tablet   Generic drug: zolpidem   Take 10 mg by mouth at bedtime.      INTERMEZZO 3.5 MG Subl   Generic drug: Zolpidem Tartrate   Place 1 tablet under the tongue at bedtime as needed.      ARIPiprazole 5 MG tablet   Commonly known as: ABILIFY   Take 5 mg by mouth every morning.      aspirin EC 325 MG tablet   Take 1 tablet (325 mg total) by mouth daily.      B-12 2500 MCG Subl   Place 1 tablet under the tongue 2 (two) times a week.      Biotin 1000 MCG tablet   Take 2,000 mcg by mouth every evening.      CALCIUM CITRATE + PO   Take 500 mg by  mouth 3 (three) times daily. *Chewables*      cephALEXin 500 MG capsule   Commonly known as: KEFLEX   Take 500 mg by mouth 4 (four) times daily.      citalopram 40 MG tablet   Commonly known as: CELEXA   Take 40 mg by mouth at bedtime.      COQ-10 PO   Take 1 capsule by mouth daily.      docusate sodium 100 MG capsule   Commonly known as: COLACE   Take 100 mg by mouth 2 (two) times daily.      DULoxetine 60 MG capsule   Commonly known as: CYMBALTA   Take 60 mg by mouth daily.      etodolac 400 MG tablet   Commonly known as: LODINE   Take 400 mg by mouth every evening.      HYDROcodone-acetaminophen 10-325 MG per tablet   Commonly known as: NORCO   Take 1 tablet by mouth 4 (four) times daily as needed. For pain      ibuprofen 200 MG tablet   Commonly known as: ADVIL,MOTRIN   Take 600 mg by mouth daily as needed. For pain      Krill Oil 500 MG Caps   Take 1 capsule by mouth every evening.      levothyroxine 150 MCG tablet   Commonly known as: SYNTHROID, LEVOTHROID   Take 150 mcg by mouth every morning.      methocarbamol 500 MG tablet   Commonly known as: ROBAXIN   Take 500 mg by mouth 4 (four) times daily.      MULTIVITAMIN/IRON PO   Take 1  tablet by mouth 2 (two) times daily.      oxyCODONE-acetaminophen 5-325 MG per tablet   Commonly known as: PERCOCET/ROXICET   Take 1-2 tablets by mouth every 4 (four) hours as needed.      pantoprazole 40 MG tablet   Commonly known as: PROTONIX   Take 1 tablet (40 mg total) by mouth daily.      topiramate 100 MG tablet   Commonly known as: TOPAMAX   Take 150 mg by mouth at bedtime.      Vitamin D (Ergocalciferol) 50000 UNITS Caps   Commonly known as: DRISDOL   Take 50,000 Units by mouth every 7 (seven) days. *Taken on Mondays           Follow-up Information    Follow up with ROSE,CRYSTAL, PA. (Neurologist in 2 weeks)    Contact information:   [address] [place] [address] [phone]       Follow up with Colette Ribas, MD. Schedule an appointment as soon as possible for a visit in 2 weeks.   Contact information:   [address] [place] [address] [phone]           The results of significant diagnostics from this hospitalization (including imaging, microbiology, ancillary and laboratory) are listed below for reference.    Significant Diagnostic Studies: Dg Chest 2 View  11/01/2011  *RADIOLOGY REPORT*  Clinical Data: Stroke  CHEST - 2 VIEW  Comparison: 07/13/2010  Findings: Heart and mediastinal contours are within normal limits. The lung fields appear clear with no signs of focal infiltrate or congestive failure.  No pleural fluid or significant peribronchial cuffing is seen.  Bony structures appear intact.  IMPRESSION: Stable cardiopulmonary appearance with no new focal or acute abnormality identified   Original Report Authenticated By: [employer]  S. Kyung Rudd, M.D.    Ct Head Wo Contrast  10/30/2011  *RADIOLOGY REPORT*  Clinical Data: Headache.  Blurred vision.  CT HEAD WITHOUT CONTRAST  Technique:  Contiguous axial images were obtained from the base of the skull through the vertex without contrast.  Comparison:  07/01/2007  Findings: The brain stem, cerebellum, cerebral peduncles, thalami, basal ganglia, basilar cisterns, and ventricular system appear unremarkable.  No intracranial hemorrhage, mass lesion, or acute infarction is identified.  The visualized paranasal sinuses appear clear. The small chronic lacunar infarcts in the bilateral pica distribution shown on MRI are not well observed by CT.  IMPRESSION:  No significant abnormality identified.   Original Report Authenticated By: Dellia Cloud, M.D.    Mr [facility] Head Wo Contrast  10/31/2011  *RADIOLOGY REPORT*  Clinical Data:  Stroke  MRI HEAD WITHOUT CONTRAST MRA HEAD WITHOUT CONTRAST  Technique:  Multiplanar, multiecho pulse sequences of the brain and surrounding structures were obtained without intravenous contrast. Angiographic images of the head were obtained using MRA technique without contrast.  Comparison:  CT head 10/30/2011  MRI HEAD  Findings:  Acute infarct involving the left posterior medial temporal lobe and left medial occipital lobe.   No associated hemorrhage.  No other acute infarct.  Small chronic infarcts in the cerebellum bilaterally.  Cerebral white matter is normal.  Ventricle size is normal.  Negative for intracranial hemorrhage. No mass lesion is present.  Paranasal sinuses are clear.  IMPRESSION: Acute infarct left posterior cerebral artery territory involving the posterior medial temporal lobe and medial occipital lobe.  MRA HEAD  Findings: Right vertebral artery is patent to the basilar.  Left vertebral artery is small and ends in pica.  Basilar artery is patent.  Fetal origin of the right posterior cerebral artery with hypoplastic right P1 segment.  Left posterior cerebral artery origin is from  the basilar.  Left posterior cerebral artery shows proximal occlusion compatible with the acute infarction.  Superior cerebellar arteries are patent bilaterally.  Internal carotid artery is patent.  Decreased signal in the cavernous carotid  which may be due to artifact.  Hypoplastic right A1 segment appears congenital.  Anterior  and middle cerebral arteries are patent bilaterally.  Negative for cerebral aneurysm.  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  Small left vertebral artery which ends in pica.  This may be a congenital variant.   Original Report Authenticated By: Camelia Phenes, M.D.    Mr Brain Wo Contrast  10/31/2011  *RADIOLOGY REPORT*  Clinical Data:  Stroke  MRI HEAD WITHOUT CONTRAST MRA HEAD WITHOUT CONTRAST  Technique:  Multiplanar, multiecho pulse sequences of the brain and surrounding structures were obtained without intravenous contrast. Angiographic images of the head were obtained using MRA technique without contrast.  Comparison:  CT head 10/30/2011  MRI HEAD  Findings:  Acute infarct involving the left posterior medial temporal lobe and left medial occipital lobe.   No associated hemorrhage.  No other acute infarct.  Small chronic infarcts in the cerebellum bilaterally.  Cerebral white matter is normal.  Ventricle size is normal.  Negative for intracranial hemorrhage. No mass lesion is present.  Paranasal sinuses are clear.  IMPRESSION: Acute infarct left posterior cerebral artery territory involving the posterior medial temporal lobe and medial occipital lobe.  MRA HEAD  Findings: Right vertebral artery is patent to the basilar.  Left vertebral artery is small and ends in pica.  Basilar artery is patent.  Fetal origin of the right posterior  cerebral artery with hypoplastic right P1 segment.  Left posterior cerebral artery origin is from  the basilar.  Left posterior cerebral artery shows proximal occlusion compatible with the acute infarction.  Superior cerebellar arteries are patent bilaterally.  Internal carotid artery is patent.  Decreased signal in the cavernous carotid which may be due to artifact.  Hypoplastic right A1 segment appears congenital.  Anterior  and middle cerebral  arteries are patent bilaterally.  Negative for cerebral aneurysm.  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  Small left vertebral artery which ends in pica.  This may be a congenital variant.   Original Report Authenticated By: Camelia Phenes, M.D.    US Carotid Duplex Bilateral  10/31/2011  *RADIOLOGY REPORT*  Clinical Data: Acute left PCA territory infarct  BILATERAL CAROTID DUPLEX ULTRASOUND  Technique: Wallace Cullens scale imaging, color Doppler and duplex ultrasound was performed of bilateral carotid and vertebral arteries in the neck.  Comparison:  10/31/2011 MRI  Criteria:  Quantification of carotid stenosis is based on velocity parameters that correlate the residual internal carotid diameter with NASCET-based stenosis levels, using the diameter of the distal internal carotid lumen as the denominator for stenosis measurement.  The following velocity measurements were obtained:                   PEAK SYSTOLIC/END DIASTOLIC RIGHT ICA:                        96/33cm/sec CCA:                        77/10cm/sec SYSTOLIC ICA/CCA RATIO:     1.26 DIASTOLIC ICA/CCA RATIO:    3.42 ECA:                        83cm/sec  LEFT ICA:                        119/32cm/sec CCA:                        89/18cm/sec SYSTOLIC ICA/CCA RATIO:     1.34 DIASTOLIC ICA/CCA RATIO:    1.76 ECA:                        93cm/sec  Findings:  RIGHT CAROTID ARTERY: Very minor left carotid system intimal thickening.  No significant right ICA stenosis, velocity elevation, or turbulent flow.  RIGHT VERTEBRAL ARTERY:  Antegrade  LEFT CAROTID ARTERY: Very minor left carotid system intimal thickening.  No significant atherosclerotic change.  No hemodynamically significant left ICA stenosis, velocity elevation, or turbulent flow.  LEFT VERTEBRAL ARTERY:  Antegrade  IMPRESSION: Minor carotid intimal thickening.  No significant ICA stenosis by ultrasound.   Original Report Authenticated By: Judie Petit. Ruel Favors, M.D.    US  Venous Img Lower Unilateral Right  10/24/2011  *RADIOLOGY REPORT*  Clinical Data: ; RIGHT LEG PAIN, SWELLING.  RIGHT LOWER EXTREMITY VENOUS DUPLEX ULTRASOUND  Technique: Gray-scale sonography with compression, as well as color and duplex ultrasound, were performed to evaluate the deep venous system from the level of the common femoral vein through the popliteal and proximal calf veins.  Comparison: None  Findings:  Normal compressibility and normal Doppler signal within the common femoral, superficial femoral and popliteal veins, down to the proximal calf veins.  No grayscale  filling defects to suggest DVT.  There is clot within the right greater saphenous vein.  This is occlusive proximally and partially occlusive in the mid and distal GSV.  IMPRESSION: No evidence of right lower extremity deep vein thrombosis.  Superficial thrombosis of the right greater saphenous vein as above.   Original Report Authenticated By: Cyndie Chime, M.D.     Microbiology: No results found for this or any previous visit (from the past 240 hour(s)).   Labs: Basic Metabolic Panel:  Lab 11/01/11 1610 10/31/11 0526 10/30/11 2049  NA 140 138 140  K 4.1 3.5 3.5  CL 110 108 107  CO2 21 20 24   GLUCOSE 96 102* 91  BUN 15 18 25*  CREATININE 0.83 0.74 1.02  CALCIUM 8.7 9.2 9.4  MG -- -- --  PHOS -- -- --   Liver Function Tests:  Lab 10/30/11 2049  AST 24  ALT 36*  ALKPHOS 91  BILITOT 0.2*  PROT 6.9  ALBUMIN 3.8   No results found for this basename: LIPASE:5,AMYLASE:5 in the last 168 hours No results found for this basename: AMMONIA:5 in the last 168 hours CBC:  Lab 11/01/11 0520 10/31/11 0829 10/31/11 0526 10/30/11 2049  WBC 7.3 6.6 7.1 7.4  NEUTROABS -- -- -- 3.7  HGB 13.1 13.2 13.2 13.7  HCT 38.4 39.0 38.2 40.1  MCV 94.3 93.8 92.9 94.1  PLT 212 207 218 226   Cardiac Enzymes:  Lab 10/31/11 1419 10/31/11 0526 10/30/11 2327  CKTOTAL -- -- --  CKMB -- -- --  CKMBINDEX -- -- --  TROPONINI <0.30  <0.30 <0.30   BNP: BNP (last 3 results) No results found for this basename: PROBNP:3 in the last 8760 hours CBG: No results found for this basename: GLUCAP:5 in the last 168 hours  Time coordinating discharge: greater than 30 minutes  Signed:  Rashia Mckesson  [facility] 11/01/2011, 11:58 AM

## 2011-11-01 NOTE — Progress Notes (Signed)
Patient given discharge instructions with no questions unanswered. Mother, husband, and daughter at bedside. Disk with MRI information given to patient for f/u appointment with neurologist. Stressed importance of seeking medical care when stroke symptoms present. Prescriptions given. Patient taken out of facility by wheelchair and staff. Patient left with family via personal vehicle.

## 2011-11-01 NOTE — Progress Notes (Signed)
Physical Therapy Treatment Patient Details Name: Debbie Pineda MRN: [id number] DOB: 1960-12-14 Today's Date: 11/01/2011 Time: 4098-1191 PT Time Calculation (min): 18 min  PT Assessment / Plan / Recommendation Comments on Treatment Session  Pt was found to have no strength, balance or coordination deficiencies.  She reports her only residual problem to be a continuing headache (3 on a 0-10 scale) and R visual field loss.  Visual problem does not affect balance.    Follow Up Recommendations  No PT follow up     Does the patient have the potential to tolerate intense rehabilitation     Barriers to Discharge        Equipment Recommendations  None recommended by PT    Recommendations for Other Services    Frequency     Plan All goals met and education completed, patient dischaged from PT services    Precautions / Restrictions     Pertinent Vitals/Pain     Mobility  Bed Mobility Supine to Sit: 7: Independent Sit to Supine: 7: Independent Transfers Sit to Stand: 7: Independent Stand to Sit: 7: Independent Ambulation/Gait Ambulation/Gait Assistance: 7: Independent Ambulation Distance (Feet): 150 Feet Assistive device: None Gait Pattern: Within Functional Limits General Gait Details: no gait abnormalities noted Stairs: No Wheelchair Mobility Wheelchair Mobility: No    Exercises     PT Diagnosis:    PT Problem List:   PT Treatment Interventions:     PT Goals    Visit Information  Last PT Received On: 11/01/11    Subjective Data  Subjective: only my eyesight is affected   Cognition       Balance  Balance Balance Assessed: Yes Dynamic Standing Balance Dynamic Standing - Balance Support: No upper extremity supported Dynamic Standing - Level of Assistance: 7: Independent High Level Balance High Level Balance Activites: Side stepping;Braiding;Backward walking;Direction changes;Turns;Sudden stops;Head turns High Level Balance Comments: no LOB with all  activities  End of Session PT - End of Session Equipment Utilized During Treatment: Gait belt Activity Tolerance: Patient tolerated treatment well Patient left: in bed;with call bell/phone within reach   GP     Debbie Pineda L 11/01/2011, 11:36 AM

## 2011-11-01 NOTE — Progress Notes (Signed)
[facility] Raksha Wolfgang A. Gerilyn Pilgrim, MD     [email or web address]         Subjective: Interval History: No changes noted overnight. No new complaints are reported.  Objective: Vital signs in last 24 hours: Temp:  [97.8 F (36.6 C)-99.6 F (37.6 C)] 97.9 F (36.6 C) (10/10 0550) Pulse Rate:  [47-72] 53  (10/10 0550) Resp:  [18-20] 19  (10/10 0550) BP: (90-120)/(51-62) 92/59 mmHg (10/10 0550) SpO2:  [97 %-99 %] 99 % (10/10 0550)  Intake/Output from previous day: 10/09 0701 - 10/10 0700 In: 3765 [P.O.:240; I.V.:3525] Out: 2600 [Urine:2600] Intake/Output this shift:   Nutritional status: Cardiac  PE GENERAL: Patient is awake and alert. She did not acute distress.  HEENT: Unremarkable.  ABDOMEN: soft  EXTREMITIES: No edema   SKIN: Normal by inspection.    MENTAL STATUS: Alert and oriented. Speech, language and cognition are generally intact. Judgment and insight normal.   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  MOTOR: She moves both sides well and equally.  COORDINATION: Left finger to nose is normal, right finger to nose is normal, No rest tremor; no intention tremor; no postural tremor; no bradykinesia.         Lab Results:  Basename 11/01/11 0520 10/31/11 0829 10/31/11 0526  WBC 7.3 6.6 --  HGB 13.1 13.2 --  HCT 38.4 39.0 --  PLT 212 207 --  NA 140 -- 138  K 4.1 -- 3.5  CL 110 -- 108  CO2 21 -- 20  GLUCOSE 96 -- 102*  BUN 15 -- 18  CREATININE 0.83 -- 0.74  CALCIUM 8.7 -- 9.2  LABA1C -- -- --   Lipid Panel  Basename 10/31/11 0526  CHOL 150  TRIG 75  HDL 58  CHOLHDL 2.6  VLDL 15  LDLCALC 77    Studies/Results: Ct Head Wo Contrast  10/30/2011   *RADIOLOGY REPORT*  Clinical Data: Headache.  Blurred vision.  CT HEAD WITHOUT CONTRAST  Technique:  Contiguous axial images were obtained from the base of the skull through the vertex without contrast.  Comparison: 07/01/2007  Findings: The brain stem, cerebellum, cerebral peduncles, thalami, basal ganglia, basilar cisterns, and ventricular system appear unremarkable.  No intracranial hemorrhage, mass lesion, or acute infarction is identified.  The visualized paranasal sinuses appear clear. The small chronic lacunar infarcts in the bilateral pica distribution shown on MRI are not well observed by CT.  IMPRESSION:  No significant abnormality identified.   Original Report Authenticated By: Dellia Cloud, M.D.    Mr [facility] Head Wo Contrast  10/31/2011  *RADIOLOGY REPORT*  Clinical Data:  Stroke  MRI HEAD WITHOUT CONTRAST MRA HEAD WITHOUT CONTRAST  Technique:  Multiplanar, multiecho pulse sequences of the brain and surrounding structures were obtained without intravenous contrast. Angiographic images of the head were obtained using MRA technique without contrast.  Comparison:  CT head 10/30/2011  MRI HEAD  Findings:  Acute infarct involving the left posterior medial temporal lobe and left medial occipital lobe.   No associated hemorrhage.  No other acute infarct.  Small chronic infarcts in the cerebellum bilaterally.  Cerebral white matter is normal.  Ventricle size is normal.  Negative for intracranial hemorrhage. No mass lesion is present.  Paranasal sinuses are clear.  IMPRESSION: Acute infarct left posterior cerebral artery territory involving the posterior medial temporal lobe and medial occipital lobe.  MRA HEAD  Findings: Right vertebral artery is patent to the basilar.  Left vertebral artery is small and ends in pica.  Basilar artery is patent.  Fetal origin of the right posterior cerebral artery with hypoplastic right P1 segment.  Left posterior cerebral artery origin is from  the basilar.  Left  posterior cerebral artery shows proximal occlusion compatible with the acute infarction.  Superior cerebellar arteries are patent bilaterally.  Internal carotid artery is patent.  Decreased signal in the cavernous carotid which may be due to artifact.  Hypoplastic right A1 segment appears congenital.  Anterior  and middle cerebral arteries are patent bilaterally.  Negative for cerebral aneurysm.  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  Small left vertebral artery which ends in pica.  This may be a congenital variant.   Original Report Authenticated By: Camelia Phenes, M.D.    Mr Brain Wo Contrast  10/31/2011  *RADIOLOGY REPORT*  Clinical Data:  Stroke  MRI HEAD WITHOUT CONTRAST MRA HEAD WITHOUT CONTRAST  Technique:  Multiplanar, multiecho pulse sequences of the brain and surrounding structures were obtained without intravenous contrast. Angiographic images of the head were obtained using MRA technique without contrast.  Comparison:  CT head 10/30/2011  MRI HEAD  Findings:  Acute infarct involving the left posterior medial temporal lobe and left medial occipital lobe.   No associated hemorrhage.  No other acute infarct.  Small chronic infarcts in the cerebellum bilaterally.  Cerebral white matter is normal.  Ventricle size is normal.  Negative for intracranial hemorrhage. No mass lesion is present.  Paranasal sinuses are clear.  IMPRESSION: Acute infarct left posterior cerebral artery territory involving the posterior medial temporal lobe and medial occipital lobe.  MRA HEAD  Findings: Right vertebral artery is patent to the basilar.  Left vertebral artery is small and ends in pica.  Basilar artery is patent.  Fetal origin of the right posterior cerebral artery with hypoplastic right P1 segment.  Left posterior cerebral artery origin is from  the basilar.  Left posterior cerebral artery shows proximal occlusion compatible with the acute infarction.  Superior cerebellar  arteries are patent bilaterally.  Internal carotid artery is patent.  Decreased signal in the cavernous carotid which may be due to artifact.  Hypoplastic right A1 segment appears congenital.  Anterior  and middle cerebral arteries are patent bilaterally.  Negative for cerebral aneurysm.  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  Small left vertebral artery which ends in pica.  This may be a congenital variant.   Original Report Authenticated By: Camelia Phenes, M.D.    US Carotid Duplex Bilateral  10/31/2011  *RADIOLOGY REPORT*  Clinical Data: Acute left PCA territory infarct  BILATERAL CAROTID DUPLEX ULTRASOUND  Technique: Wallace Cullens scale imaging, color Doppler and duplex ultrasound was performed of bilateral carotid and vertebral arteries in the neck.  Comparison:  10/31/2011 MRI  Criteria:  Quantification of carotid stenosis is based on velocity parameters that correlate the residual internal carotid diameter with NASCET-based stenosis levels, using the diameter of the distal internal carotid lumen as the denominator for stenosis measurement.  The following velocity measurements were obtained:                   PEAK SYSTOLIC/END DIASTOLIC RIGHT ICA:  96/33cm/sec CCA:                        77/10cm/sec SYSTOLIC ICA/CCA RATIO:     1.26 DIASTOLIC ICA/CCA RATIO:    3.42 ECA:                        83cm/sec  LEFT ICA:                        119/32cm/sec CCA:                        89/18cm/sec SYSTOLIC ICA/CCA RATIO:     1.34 DIASTOLIC ICA/CCA RATIO:    1.76 ECA:                        93cm/sec  Findings:  RIGHT CAROTID ARTERY: Very minor left carotid system intimal thickening.  No significant right ICA stenosis, velocity elevation, or turbulent flow.  RIGHT VERTEBRAL ARTERY:  Antegrade  LEFT CAROTID ARTERY: Very minor left carotid system intimal thickening.  No significant atherosclerotic change.  No hemodynamically significant left ICA stenosis, velocity  elevation, or turbulent flow.  LEFT VERTEBRAL ARTERY:  Antegrade  IMPRESSION: Minor carotid intimal thickening.  No significant ICA stenosis by ultrasound.   Original Report Authenticated By: Judie Petit. Ruel Favors, M.D.     Medications:  Scheduled Meds:   . ARIPiprazole  5 mg Oral q morning - 10a  . aspirin  325 mg Oral Daily  . citalopram  40 mg Oral QHS  . docusate sodium  100 mg Oral BID  . DULoxetine  60 mg Oral Daily  . enoxaparin (LOVENOX) injection  40 mg Subcutaneous Daily  . famotidine  20 mg Oral BID  . influenza  inactive virus vaccine  0.5 mL Intramuscular Tomorrow-1000  . levothyroxine  150 mcg Oral QAC breakfast  . methocarbamol  500 mg Oral QID  . sodium chloride  500 mL Intravenous Once  . sodium chloride      . topiramate  150 mg Oral QHS   Continuous Infusions:   . 0.9 % NaCl with KCl 20 mEq / L 100 mL/hr at 10/31/11 1923  . DISCONTD: heparin Stopped (10/31/11 0930)   PRN Meds:.acetaminophen, HYDROcodone-acetaminophen, ondansetron (ZOFRAN) IV, traZODone   ECHO - Left ventricle: The cavity size was normal. Wall thickness was normal. Systolic function was normal. The estimated ejection fraction was in the range of 55% to 60%. Wall motion was normal; there were no regional wall motion abnormalities. - Aortic valve: Mildly calcified annulus. Trileaflet. - Right ventricle: The cavity size was mildly dilated. Wall thickness was at the upper limits of normal. - Right atrium: The atrium was mildly dilated. - Atrial septum: No defect or patent foramen ovale was identified.  Brain MRI is reviewed in person. There is no acute infarct involving the medial temporal area on the Left side. It is seen on diffusion imaging with the corresponding area hypointense on the ADC scan. There is occlusion of the corresponding Left PCA but no involvement of the occipital lobe however. She does have a hypoplastic left Vertebral artery but the basilar artery is widely patent. I cannot  appreciate old infarcts although she apparently has a history of questionable infarcts. The T1 scan does not show lacunar infarcts or other cortical infarcts.  Assessment/Plan: 1. 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 Fortunately, the occipital lobe is spared. Again, the patient does not represent aspirin failure and therefore think she should restart taking aspirin. From what I can ascertain, the patient's Thrombosis/thrombophlebitis this was a superficial issue and therefore warfarin is not recommended. PFO was not seen on echo done from a transthoracic approach. Suspect that she likely has a small PFO. The patient may continue her other pre- Hospitalization medications other than the warfarin.     LOS: 2 days   Hans Rusher

## 2011-11-01 NOTE — Procedures (Signed)
[facility] Jawan Chavarria A. Gerilyn Pilgrim, MD     [email or web address]        FACILITY: [facility]  PHYSICIAN: Terrian Sentell A. Gerilyn Pilgrim, M.D.  DATE OF PROCEDURE: 10/31/2011 EEG INTERPRETATION  INDICATION:This is a 51 year old who presents with altered mental status, headaches and confusion. She has had previous spells of confusion. Study is being done to evaluate for possible nonconvulsive seizures.  MEDICATIONS:  Scheduled Meds:   . ARIPiprazole  5 mg Oral q morning - 10a  . aspirin  325 mg Oral Daily  . citalopram  40 mg Oral QHS  . docusate sodium  100 mg Oral BID  . DULoxetine  60 mg Oral Daily  . enoxaparin (LOVENOX) injection  40 mg Subcutaneous Daily  . famotidine  20 mg Oral BID  . influenza  inactive virus vaccine  0.5 mL Intramuscular Tomorrow-1000  . levothyroxine  150 mcg Oral QAC breakfast  . methocarbamol  500 mg Oral QID  . sodium chloride  500 mL Intravenous Once  . topiramate  150 mg Oral QHS   Continuous Infusions:   . 0.9 % NaCl with KCl 20 mEq / L 100 mL/hr at 11/01/11 0935   PRN Meds:.acetaminophen, ondansetron (ZOFRAN) IV, oxyCODONE-acetaminophen, traZODone, DISCONTD: HYDROcodone-acetaminophen   ANALYSIS: A 16-channel recording using standard 10/20 measurements is conducted for 21 minutes.  There is a well-formed posterior dominant rhythm of 10-1/2 Hz which attenuates with eye opening. There is beta activity observed in the frontal areas. Awake and drowsy activities are recorded. Photic stimulation is carried out without abnormal changes in the background activity. There is no focal or lateralized slowing observed. There is no epileptiform activity observed.   IMPRESSION:  This is an abnormal recording of the awake and drowsy states.  Helaine Yackel A. Gerilyn Pilgrim, M.D. Diplomate, Biomedical engineer of Psychiatry and Neurology ( Neurology).

## 2011-11-08 ENCOUNTER — Ambulatory Visit (INDEPENDENT_AMBULATORY_CARE_PROVIDER_SITE_OTHER): Payer: 59 | Admitting: Surgery

## 2011-11-16 DIAGNOSIS — Q211 Atrial septal defect: Secondary | ICD-10-CM | POA: Diagnosis not present

## 2011-11-16 DIAGNOSIS — I6330 Cerebral infarction due to thrombosis of unspecified cerebral artery: Secondary | ICD-10-CM | POA: Diagnosis not present

## 2011-11-16 DIAGNOSIS — H53469 Homonymous bilateral field defects, unspecified side: Secondary | ICD-10-CM | POA: Diagnosis not present

## 2011-11-16 DIAGNOSIS — M79609 Pain in unspecified limb: Secondary | ICD-10-CM | POA: Diagnosis not present

## 2011-11-20 ENCOUNTER — Ambulatory Visit: Payer: 59 | Admitting: *Deleted

## 2011-11-22 ENCOUNTER — Encounter: Payer: 59 | Attending: Surgery | Admitting: *Deleted

## 2011-11-22 DIAGNOSIS — Z9884 Bariatric surgery status: Secondary | ICD-10-CM | POA: Insufficient documentation

## 2011-11-22 DIAGNOSIS — Z713 Dietary counseling and surveillance: Secondary | ICD-10-CM | POA: Insufficient documentation

## 2011-11-22 DIAGNOSIS — Z09 Encounter for follow-up examination after completed treatment for conditions other than malignant neoplasm: Secondary | ICD-10-CM | POA: Insufficient documentation

## 2011-11-22 NOTE — Progress Notes (Signed)
  Follow-up visit:  12 Months Post-Operative Gastric Bypass Surgery  Medical Nutrition Therapy:  Appt start time:  1500   End time:  1530  Primary concerns today: Post-operative bariatric surgery nutrition management.  Debbie Pineda returns today with husband for 12 mo f/u. Reports recent stroke on left side of brain (10/30/11) and husband states main sx is memory loss/trouble with short term memory. Denies difficulty swallowing/chewing. Not exercising d/t stroke. Continues to eat increased carbs at 3-4 am snack; discussed replacing with protein instead. Will have her return in 3 mos to make sure all is still on track.  Surgery date: 11/20/10  Surgery type: Gastric Bypass  Start weight at [facility]: 321.8 lbs  Weight today: 239.5 lbs Weight change: 9.0 lbs Total weight lost: 82.3 lbs BMI: 39.9 kg/m^2  Weight goal: 175 lbs (Sz 12)  % goal met: 50%  TANITA  BODY COMP RESULTS  05/31/11 08/02/11 11/22/11    %Fat 51.7% 50.3% 49.2%    FM (lbs) 133.0 125.0 118.0    FFM (lbs) 124.0 123.5 121.5    TBW (lbs) 91.0 90.5 89.0   24-hr recall: B (8 AM): 2 [place] weenies, colby/monterey jack cheese, yoplait 100 greek yogurt Snk (AM): None L (12-1 PM): Tomato and grilled cheese on white Snk (PM): [place] yogurt  D (6-7 PM): Lean meat (pork chop or chicken breast = 2 oz) w/ green beans or salad  Snk (8-10 PM): yogurt (13g) OR fudgesicle (1)  Snk (3-4 AM):  [place] yogurt and SF jello w/ fruit (if available, eats both)  Fluid intake: Water, protein shake, decaf tea w/ splenda = 80-90 oz  Estimated total protein intake: 85-100g  Medications: See medication list. Reconciled with patient and husband. Supplementation: Taking supplements regularly w/ no problems reported  Using straws: No Drinking while eating: Sips Hair loss: No - resolved Carbonated beverages: no N/V/D/C: None - uses Colace 2x/day to prevent constipation Dumping syndrome: No  Recent physical activity:  No recent exercise d/t stroke  Progress  Towards Goal(s):  In progress.   Nutritional Diagnosis:  [place]-3.3 Overweight/obesity As related to recent Gastric Bypass surgery.  As evidenced by pt following Gastric Bypass nutrition guidelines for continued weight loss.    Intervention:  Nutrition education.  Monitoring/Evaluation:  Dietary intake, exercise, and body weight. Follow up in 3 months for 15 month post-op visit.

## 2011-11-22 NOTE — Patient Instructions (Addendum)
Goals:  Follow Phase 3B: High Protein + Non-Starchy Vegetables  Eat 3-6 small meals/snacks, every 3-5 hrs  Continue intake of lean protein foods to meet 60-80g goal  Increase fluid intake to 64oz +  Avoid drinking 15 minutes before, during and 30 minutes after eating  Aim for >30 min of physical activity daily  Limit fruit to 1/2 cup a day; only a few days/week  Consider replacing 2-3 am snack with lean protein instead of carbs  Limit carbs to 15-20 g per meal; 15 g per snack

## 2011-11-23 DIAGNOSIS — H53469 Homonymous bilateral field defects, unspecified side: Secondary | ICD-10-CM | POA: Diagnosis not present

## 2011-11-24 ENCOUNTER — Encounter: Payer: Self-pay | Admitting: *Deleted

## 2011-11-26 ENCOUNTER — Other Ambulatory Visit (HOSPITAL_COMMUNITY): Payer: Self-pay | Admitting: Family Medicine

## 2011-11-26 ENCOUNTER — Ambulatory Visit (HOSPITAL_COMMUNITY)
Admission: RE | Admit: 2011-11-26 | Discharge: 2011-11-26 | Disposition: A | Discharge status: Home / Self Care | Payer: 59 | Source: Ambulatory Visit | Attending: Family Medicine | Admitting: Family Medicine

## 2011-11-26 DIAGNOSIS — M7989 Other specified soft tissue disorders: Secondary | ICD-10-CM

## 2011-11-26 DIAGNOSIS — I8000 Phlebitis and thrombophlebitis of superficial vessels of unspecified lower extremity: Secondary | ICD-10-CM | POA: Insufficient documentation

## 2011-11-26 DIAGNOSIS — I639 Cerebral infarction, unspecified: Secondary | ICD-10-CM

## 2011-11-26 DIAGNOSIS — M79609 Pain in unspecified limb: Secondary | ICD-10-CM

## 2011-11-26 DIAGNOSIS — Z139 Encounter for screening, unspecified: Secondary | ICD-10-CM

## 2011-11-29 DIAGNOSIS — IMO0001 Reserved for inherently not codable concepts without codable children: Secondary | ICD-10-CM | POA: Diagnosis not present

## 2011-11-29 DIAGNOSIS — R5383 Other fatigue: Secondary | ICD-10-CM | POA: Diagnosis not present

## 2011-11-29 DIAGNOSIS — G4700 Insomnia, unspecified: Secondary | ICD-10-CM | POA: Diagnosis not present

## 2011-11-29 DIAGNOSIS — Z7901 Long term (current) use of anticoagulants: Secondary | ICD-10-CM | POA: Diagnosis not present

## 2011-11-29 DIAGNOSIS — R5381 Other malaise: Secondary | ICD-10-CM | POA: Diagnosis not present

## 2011-11-29 DIAGNOSIS — I639 Cerebral infarction, unspecified: Secondary | ICD-10-CM | POA: Insufficient documentation

## 2011-11-29 DIAGNOSIS — Z5181 Encounter for therapeutic drug level monitoring: Secondary | ICD-10-CM | POA: Diagnosis not present

## 2011-11-29 DIAGNOSIS — Q211 Atrial septal defect: Secondary | ICD-10-CM | POA: Diagnosis not present

## 2011-12-06 DIAGNOSIS — Q211 Atrial septal defect: Secondary | ICD-10-CM | POA: Diagnosis not present

## 2011-12-19 ENCOUNTER — Ambulatory Visit (HOSPITAL_COMMUNITY)
Admission: RE | Admit: 2011-12-19 | Discharge: 2011-12-19 | Disposition: A | Discharge status: Home / Self Care | Payer: 59 | Source: Ambulatory Visit | Attending: Internal Medicine | Admitting: Internal Medicine

## 2011-12-19 ENCOUNTER — Other Ambulatory Visit (HOSPITAL_COMMUNITY): Payer: Self-pay | Admitting: Internal Medicine

## 2011-12-19 DIAGNOSIS — M79609 Pain in unspecified limb: Secondary | ICD-10-CM

## 2011-12-19 DIAGNOSIS — I8000 Phlebitis and thrombophlebitis of superficial vessels of unspecified lower extremity: Secondary | ICD-10-CM | POA: Insufficient documentation

## 2011-12-25 ENCOUNTER — Ambulatory Visit (HOSPITAL_COMMUNITY): Payer: 59

## 2011-12-28 ENCOUNTER — Ambulatory Visit (HOSPITAL_COMMUNITY)
Admission: RE | Admit: 2011-12-28 | Discharge: 2011-12-28 | Disposition: A | Discharge status: Home / Self Care | Payer: 59 | Source: Ambulatory Visit | Attending: Family Medicine | Admitting: Family Medicine

## 2011-12-28 DIAGNOSIS — Z1231 Encounter for screening mammogram for malignant neoplasm of breast: Secondary | ICD-10-CM | POA: Insufficient documentation

## 2011-12-28 DIAGNOSIS — Z139 Encounter for screening, unspecified: Secondary | ICD-10-CM

## 2012-01-11 ENCOUNTER — Telehealth (INDEPENDENT_AMBULATORY_CARE_PROVIDER_SITE_OTHER): Payer: Self-pay

## 2012-01-11 ENCOUNTER — Ambulatory Visit (INDEPENDENT_AMBULATORY_CARE_PROVIDER_SITE_OTHER): Payer: 59 | Admitting: Surgery

## 2012-01-11 NOTE — Telephone Encounter (Signed)
Dr . Ezzard Standing reviewed Labs and notes . He stated it was not urgent to see patient today. Patient is scheduled to come in 02/20/12. 12n . I called patient to remind her of the future appt  And expressed Dr. Ezzard Standing had looked at her labs and felt they where ok. She is to call if theres a change   in her condition.

## 2012-01-11 NOTE — Telephone Encounter (Signed)
The pt called in stating she has elevated liver function studies and wants to be worked in.  Her rny was 11/20/10.  Dr Titus Dubin ran the lab tests.  Her AST was 195 and ALT 493.  She had an appointment here today but cancelled it.  Her dad is having brain surgery today.  She can come in this morning if you have something.  Please call about working her in.

## 2012-01-14 DIAGNOSIS — IMO0001 Reserved for inherently not codable concepts without codable children: Secondary | ICD-10-CM | POA: Diagnosis not present

## 2012-01-14 DIAGNOSIS — I6350 Cerebral infarction due to unspecified occlusion or stenosis of unspecified cerebral artery: Secondary | ICD-10-CM | POA: Diagnosis not present

## 2012-01-14 DIAGNOSIS — Q211 Atrial septal defect: Secondary | ICD-10-CM | POA: Diagnosis not present

## 2012-01-14 DIAGNOSIS — I999 Unspecified disorder of circulatory system: Secondary | ICD-10-CM | POA: Diagnosis not present

## 2012-01-23 DIAGNOSIS — I82409 Acute embolism and thrombosis of unspecified deep veins of unspecified lower extremity: Secondary | ICD-10-CM

## 2012-01-23 HISTORY — DX: Acute embolism and thrombosis of unspecified deep veins of unspecified lower extremity: I82.409

## 2012-01-30 ENCOUNTER — Encounter (INDEPENDENT_AMBULATORY_CARE_PROVIDER_SITE_OTHER): Payer: Self-pay | Admitting: *Deleted

## 2012-02-01 DIAGNOSIS — H53469 Homonymous bilateral field defects, unspecified side: Secondary | ICD-10-CM | POA: Diagnosis not present

## 2012-02-01 DIAGNOSIS — I6330 Cerebral infarction due to thrombosis of unspecified cerebral artery: Secondary | ICD-10-CM | POA: Diagnosis not present

## 2012-02-01 DIAGNOSIS — Q211 Atrial septal defect: Secondary | ICD-10-CM | POA: Diagnosis not present

## 2012-02-01 DIAGNOSIS — M79609 Pain in unspecified limb: Secondary | ICD-10-CM | POA: Diagnosis not present

## 2012-02-15 ENCOUNTER — Encounter (INDEPENDENT_AMBULATORY_CARE_PROVIDER_SITE_OTHER): Payer: Self-pay

## 2012-02-20 ENCOUNTER — Encounter: Payer: 59 | Attending: Surgery | Admitting: *Deleted

## 2012-02-20 ENCOUNTER — Ambulatory Visit (INDEPENDENT_AMBULATORY_CARE_PROVIDER_SITE_OTHER): Payer: 59 | Admitting: Surgery

## 2012-02-20 VITALS — BP 122/64 | HR 66 | Temp 98.8°F | Resp 18 | Ht 65.0 in | Wt 237.8 lb

## 2012-02-20 DIAGNOSIS — Z9884 Bariatric surgery status: Secondary | ICD-10-CM | POA: Diagnosis not present

## 2012-02-20 DIAGNOSIS — Z01818 Encounter for other preprocedural examination: Secondary | ICD-10-CM | POA: Insufficient documentation

## 2012-02-20 DIAGNOSIS — Z713 Dietary counseling and surveillance: Secondary | ICD-10-CM | POA: Insufficient documentation

## 2012-02-20 NOTE — Progress Notes (Signed)
[facility]  [name], MD,  FACS [address]  [address] [place], [place]    [address] Phone:  [phone] FAX:  [phone]  ASSESSMENT AND PLAN: 1. Morbid obesity.   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  She's down + 50 pounds since surgery.  I have not see her since April 2013.  Doing well from weight - but had stroke affecting vision on 10/30/2011.  Will see back in 9 months - on 2 year anniversary.  2. History of small strokes with patent foramen ovale.   Have been stable on aspirin.  She's on the 81 mg.   She had a stroke that affected her visual cortex 10/30/2011 - now cannot drive.  She was hospitalized 3. Patent foramen ovale.   Plans repair of this by Dr. Romeo Apple at [facility] after she comes off coumadin. 4. Sleep apnea on CPAP.  5. Fibromyalgia - on disability.   On oxycodone 10 mg QID. 6. Bilateral chronic knee pain. See's Dr. Tana Felts office.  She has had some shots of Syndisk in the knees.  She also takes Lodine for pain.    Doing better since weight loss. 7. Hypothyroid, on replacement. This has been recently adjusted by Dr. Phillips Odor.  8. Disability.  9. Chronic pain medicine. Switched from [place] to oxycodone to get rid of acetaminophen. 10. Depression. ADHD. She says she saw someone who said she did not have ADHD, but she can not remember her name.   She has a flat affect. 11. Small HH on UGI. 12.  Memory issues - Sees Crystal [facility] in [place] for this. 13.  On coumadin for 6 months post stroke - then put a "button" in her PFO. 14.  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 15.  Has some balance issues since stroke. 16.  Elevated LFT's - attributed to acetaminophen in [place] - so she has gone to hydrocodone alone.  Her LFT's have improved.  HISTORY OF PRESENT ILLNESS: Chief Complaint  Patient presents with  . Bariatric Follow Up    RNY   CHANNING SAVICH is a 52 y.o. (DOB: 05-30-1960)  white  female who is a patient of GOLDING, Chancy Hurter, MD and comes to me today for follow-up for RYGB.  She is doing well from the weight loss.  She occasionally vomits.  No abdominal pain.  Both her husband and daughter are with her.  Current Outpatient Prescriptions  Medication Sig Dispense Refill  . ARIPiprazole (ABILIFY) 5 MG tablet Take 5 mg by mouth every morning.       . Calcium Citrate-Vitamin D (CALCIUM CITRATE + PO) Take 500 mg by mouth 3 (three) times daily. *Chewables*      . citalopram (CELEXA) 40 MG tablet Take 40 mg by mouth at bedtime.       . Coenzyme Q10 (COQ-10 PO) Take 1 capsule by mouth daily.      . Cyanocobalamin (B-12) 2500 MCG SUBL Place 1 tablet under the tongue 2 (two) times a week.      . docusate sodium (COLACE) 100 MG capsule Take 100 mg by mouth 2 (two) times daily.      . DULoxetine (CYMBALTA) 60 MG capsule Take 60 mg by mouth daily.      Boris Lown Oil 500 MG CAPS Take 1 capsule by mouth every evening.      Marland Kitchen levothyroxine (SYNTHROID, LEVOTHROID) 150 MCG tablet Take 150 mcg by mouth  every morning.       . methocarbamol (ROBAXIN) 500 MG tablet Take 500 mg by mouth 4 (four) times daily.        . Multiple Vitamins-Iron (MULTIVITAMIN/IRON PO) Take 1 tablet by mouth 2 (two) times daily.        Marland Kitchen oxyCODONE (OXYCONTIN) 10 MG 12 hr tablet Take 10 mg by mouth every 6 (six) hours as needed.      . topiramate (TOPAMAX) 100 MG tablet Take 150 mg by mouth at bedtime.       . Vitamin D, Ergocalciferol, (DRISDOL) 50000 UNITS CAPS Take 50,000 Units by mouth every 7 (seven) days. *Taken on Mondays      . warfarin (COUMADIN) 1 MG tablet       . zolpidem (AMBIEN) 10 MG tablet Take 10 mg by mouth at bedtime.       . Zolpidem Tartrate (INTERMEZZO) 3.5 MG SUBL Place 1 tablet under the tongue at bedtime as needed.      Marland Kitchen aspirin EC 325 MG tablet Take 1 tablet (325 mg total) by mouth daily.  30 tablet  1  . Biotin 1000 MCG tablet Take 2,000 mcg by mouth every evening.       Marland Kitchen  HYDROcodone-acetaminophen (NORCO) 10-325 MG per tablet Take 1 tablet by mouth 4 (four) times daily as needed. For pain      . Methylfol-Methylcob-Acetylcyst (ALZ-NAC PO) Take 1 tablet by mouth daily.         PHYSICAL EXAM: BP 122/64  Pulse 66  Temp 98.8 F (37.1 C)  Resp 18  Ht 5\' 5"  (1.651 m)  Wt 237 lb 12.8 oz (107.865 kg)  BMI 39.57 kg/m2  General: WN WF who is alert and generally healthy appearing.  HEENT: Normal. Pupils equal.  Lungs: Clear to auscultation and symmetric breath sounds. Heart:  RRR. No murmur or rub.  Abdomen: Soft. No mass. No tenderness. No hernia. Normal bowel sounds.  Abdominal incisions look good. Extremities:  Good strength and ROM  in upper and lower extremities. Psychiatric: Seems to have poor memory.   LABS: Labs showed some elevated LFT's.  She said that since these were done, her LFT's are nomal.

## 2012-02-20 NOTE — Progress Notes (Addendum)
  Follow-up visit:  15 Months Post-Operative Gastric Bypass Surgery  Medical Nutrition Therapy:  Appt start time:  1500   End time:  1530  Primary concerns today: Post-operative bariatric surgery nutrition management.  Debbie Pineda returns today (alone) for f/u and reports increased stress/emotional eating d/t her father's recent death on 01/29/2012. States she has zero appetite for meat, though consumes 2 [place] white [place] franks daily. Eats a lot of sandwiches made with a large banana (30g CHO) and light mayo on white bread. Also reports recent problems with liver enzymes (AST & ALT).   Surgery date: 11/20/10  Surgery type: Gastric Bypass  Start weight at [facility]: 321.8 lbs  Weight today: 237.5 lbs (reports she was 229 lbs a few weeks ago) Weight change: 2.0 lbs Total weight lost: 84.3 lbs  Weight goal: 175 lbs (Sz 12)  % goal met: 49%  TANITA  BODY COMP RESULTS  05/31/11 08/02/11 11/22/11 02/20/12    BMI (kg/m^2) 42.1 40.7 39.9 39.5    FM (lbs) 133.0 125.0 118.0 117.5    FFM (lbs) 124.0 123.5 121.5 120.0    TBW (lbs) 91.0 90.5 89.0 88.0   24-hr recall: B (8 AM): 2 [place] franks, 3 oz colby/monterey jack cheese, yoplait 100 greek yogurt - 40g Snk (AM):  Yoplait greek yogurt - 13g L (12-1 PM): Banana sandwich on white bread with small amount of light mayo Snk (PM): [place] yogurt OR none - 0-13g D (6-7 PM): Lean meat (pork chop or chicken breast = 2 oz) w/ green beans or salad - 15g Snk (8-10 PM): Yogurt (13g) OR fudgesicle (1 ea)  Snk (3-4 AM): [place] yogurt and/or SF jello w/ fruit (if available, eats both) - 13g  Fluid intake: Decaf iced tea w/ splenda =  90-100 oz  Estimated total protein intake: 85-100 g  Medications: See medication list. Reconciled with patient Supplementation: Taking B12/Calcium regularly; no MVI. Discussed Freedavite MVI - so small she can swallow  Using straws: No Drinking while eating: Sips Hair loss: No - resolved Carbonated beverages: no N/V/D/C: Mild daily  constipation - uses Colace 2x/day to prevent constipation; BM every other day Dumping syndrome: No  Recent physical activity:  Recently resumed exercising on treadmill since stroke. Inconsistent at this time, but plans to increase.   Progress Towards Goal(s):  In progress.   Nutritional Diagnosis:  [place]-3.3 Overweight/obesity As related to recent Gastric Bypass surgery.  As evidenced by pt following Gastric Bypass nutrition guidelines for continued weight loss.    Intervention:  Nutrition education.  Samples given during visit include:   Freedavite MVI: 2 bottles @ 5 tabs/bottle Lot # [id number]; Exp: 01/17  Monitoring/Evaluation:  Dietary intake, exercise, and body weight. Follow up in 3 months for 18 month post-op visit.

## 2012-02-21 ENCOUNTER — Ambulatory Visit: Payer: 59 | Admitting: *Deleted

## 2012-02-23 ENCOUNTER — Encounter: Payer: Self-pay | Admitting: *Deleted

## 2012-02-23 NOTE — Patient Instructions (Addendum)
Goals:  Follow Phase 3B: High Protein + Non-Starchy Vegetables  Avoid drinking 15 minutes before, during and 30 minutes after eating  Aim for >30 min of physical activity daily  Limit fruit to 1/2 cup a day; only a few days/week  Consider replacing 2-3 am snack with lean protein instead of carbs  Limit carbs to 15-20 g per meal; 15 g per snack

## 2012-03-04 ENCOUNTER — Encounter (INDEPENDENT_AMBULATORY_CARE_PROVIDER_SITE_OTHER): Payer: Self-pay

## 2012-03-17 ENCOUNTER — Other Ambulatory Visit (INDEPENDENT_AMBULATORY_CARE_PROVIDER_SITE_OTHER): Payer: Self-pay | Admitting: Internal Medicine

## 2012-03-17 ENCOUNTER — Encounter (INDEPENDENT_AMBULATORY_CARE_PROVIDER_SITE_OTHER): Payer: Self-pay | Admitting: Internal Medicine

## 2012-03-17 ENCOUNTER — Ambulatory Visit (INDEPENDENT_AMBULATORY_CARE_PROVIDER_SITE_OTHER): Payer: 59 | Admitting: Internal Medicine

## 2012-03-17 VITALS — BP 122/70 | HR 66 | Temp 98.1°F | Resp 18 | Ht 65.0 in | Wt 239.7 lb

## 2012-03-17 DIAGNOSIS — R7989 Other specified abnormal findings of blood chemistry: Secondary | ICD-10-CM | POA: Diagnosis not present

## 2012-03-17 NOTE — Patient Instructions (Signed)
Physician will contact you with results of blood work and ultrasound when completed. 

## 2012-03-17 NOTE — Consult Note (Signed)
Presenting complaint;  Elevated transaminases.  History of present illness;  Debbie Pineda is 52 year old Caucasian female who is referred through courtesy of Dr. Pollyann Savoy for evaluation of elevated transaminases. Patient has had mildly elevated transaminases since January 2013. In November 2013 her AST and ALT were 107/231 respectively. She was on Vicodin-containing acetaminophen. She was therefore switched to OxyContin and Tylenol was laminated from her regiment. Repeat LFTs on 01/09/2012 revealed AST of 195 and ALT of 493. These are repeated again on 02/08/2012. AST was are normal at 20 and ALT was 51. Patient states that she was taking no more than 1300 mg of Tylenol per day. Beginning of last year she might have been on higher doses never more than 2 g per day. She denies abdominal pain or pruritus. She has very good appetite. Since undergoing bariatric surgery in October 2012 she has lost close to 90 pounds. She does not remember having abnormal LFTs prior to surgery. There is no history of tattoos, IV drug abuse or prior transfusion. She also denies history of jaundice of icteric hepatitis and family history is negative for chronic liver disease. She denies nausea vomiting heartburn dysphagia or skin rash. Her bowels move regularly and there is no history of melena or rectal bleeding. She received hepatitis B vaccination when she was working at [employer] but does not recall that she had a followup test to confirm that she had responded. Presently she is not able to exercise as she still has some visual symptoms and imbalance since she had last of her 3 strokes. She is planning to have surgery for PTO at [facility] next month.  Current Medications: Current Outpatient Prescriptions  Medication Sig Dispense Refill  . ARIPiprazole (ABILIFY) 5 MG tablet Take 5 mg by mouth every morning.       Marland Kitchen aspirin EC 325 MG tablet Take 1 tablet (325 mg total) by mouth daily.  30 tablet  1  . Calcium  Citrate-Vitamin D (CALCIUM CITRATE + PO) Take 500 mg by mouth 3 (three) times daily. *Chewables*      . citalopram (CELEXA) 40 MG tablet Take 40 mg by mouth at bedtime.       . Cyanocobalamin (B-12) 2500 MCG SUBL Place 1 tablet under the tongue 2 (two) times a week.      . docusate sodium (COLACE) 100 MG capsule Take 100 mg by mouth 2 (two) times daily.      . DULoxetine (CYMBALTA) 60 MG capsule Take 60 mg by mouth daily.      Boris Lown Oil 500 MG CAPS Take 1 capsule by mouth every evening.      Marland Kitchen levothyroxine (SYNTHROID, LEVOTHROID) 150 MCG tablet Take 150 mcg by mouth every morning.       . methocarbamol (ROBAXIN) 500 MG tablet Take 500 mg by mouth 4 (four) times daily.        . Multiple Vitamins-Iron (MULTIVITAMIN/IRON PO) Take 1 tablet by mouth 2 (two) times daily.        Marland Kitchen oxyCODONE (OXYCONTIN) 10 MG 12 hr tablet Take 10 mg by mouth every 6 (six) hours as needed.      . topiramate (TOPAMAX) 100 MG tablet Take 150 mg by mouth at bedtime.       . Vitamin D, Ergocalciferol, (DRISDOL) 50000 UNITS CAPS Take 50,000 Units by mouth every 7 (seven) days. *Taken on Mondays      . warfarin (COUMADIN) 1 MG tablet Take 17 mg by mouth at bedtime.       Marland Kitchen  zolpidem (AMBIEN) 10 MG tablet Take 10 mg by mouth at bedtime.       . Zolpidem Tartrate (INTERMEZZO) 3.5 MG SUBL Place 1 tablet under the tongue at bedtime as needed.       No current facility-administered medications for this visit.   Past medical history; Hypothyroidism was diagnosed about 15 years ago. 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 Obesity. 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 History of B12 deficiency. History of sleep apnea which has reversed with a weight loss and she is not requiring any therapy. Patent foramen ovale. History of CVA x3. First one was 8 years ago. She has had some visual symptoms and disequilibrium since her last CVA. Chronic insomnia. History of  depression. Fibromyalgia. Right DVT in October 2013. Surgery on right lower extremity for weight) 25 years ago. BSO with hysterectomy 20 years ago. 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  Allergies; Allergies  Allergen Reactions  . Flexeril (Cyclobenzaprine Hcl) Other (See Comments)    Numbness of extremities  . Morphine And Related Itching    Upper torso  . Septra (Bactrim) Itching and Rash    Upper torso  . Sulfa Antibiotics Itching and Rash    Itching was upper torso   Family history; Mother has multiple health problems including fibromyalgia. She has 2 brothers and one sister. Her sister has diabetes hypertension and atrial fibrillation. One brother has Crohn's disease DM and CAD and other brother has osteoarthrosis and some C. Kwell 8 of lung disease.  Social history; She is married and has one daughter. 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 She smokes 6-7 cigarettes per day for 10 years but quit in 1993. She used to drink alcohol occasionally but hasn't had any in the last 18 months.  Objective: Blood pressure 122/70, pulse 66, temperature 98.1 F (36.7 C), temperature source Oral, resp. rate 18, height 5\' 5"  (1.651 m), weight 239 lb 11.2 oz (108.727 kg).  Conjunctiva is pink. Sclera is nonicteric Oropharyngeal mucosa is normal. No neck masses or thyromegaly noted. Cardiac exam with regular rhythm normal S1 and S2. No murmur or gallop noted. Lungs are clear to auscultation. Abdomen;  No LE edema or clubbing noted.  Labs/studies Results: See H&P.  Assessment:  Asymptomatic elevation of transaminases for one year with significant bump in November 2013. Mild chronic elevation may be related to steatohepatitis. Bump in transaminases in November 2013 may be related to acetaminophen but she was not taking dose  that would be considered potentially hepatotoxic dose. Patient does not have stigmata of chronic liver disease.   Recommendations;  Hepatobiliary ultrasound. LFTs. Hepatitis B surface antigen, hepatitis B surface antibody and HCV antibody.

## 2012-03-18 LAB — HEPATITIS B SURFACE ANTIGEN: Hepatitis B Surface Ag: NEGATIVE

## 2012-03-18 LAB — HEPATIC FUNCTION PANEL
ALT: 32 U/L (ref 0–35)
AST: 19 U/L (ref 0–37)
Albumin: 3.9 g/dL (ref 3.5–5.2)
Alkaline Phosphatase: 102 U/L (ref 39–117)
Bilirubin, Direct: 0.1 mg/dL (ref 0.0–0.3)
Indirect Bilirubin: 0.2 mg/dL (ref 0.0–0.9)
Total Bilirubin: 0.3 mg/dL (ref 0.3–1.2)
Total Protein: 6.1 g/dL (ref 6.0–8.3)

## 2012-03-18 LAB — HEPATITIS B SURFACE ANTIBODY, QUANTITATIVE: Hepatitis B-Post: >1000 m[IU]/mL

## 2012-03-18 LAB — HEPATITIS C ANTIBODY: HCV Ab: NEGATIVE

## 2012-03-21 DIAGNOSIS — H53469 Homonymous bilateral field defects, unspecified side: Secondary | ICD-10-CM | POA: Diagnosis not present

## 2012-03-21 DIAGNOSIS — H2510 Age-related nuclear cataract, unspecified eye: Secondary | ICD-10-CM | POA: Diagnosis not present

## 2012-03-23 NOTE — Progress Notes (Signed)
Presenting complaint;   Elevated transaminases.   History of present illness;  Debbie Pineda is 52 year old Caucasian female who is referred through courtesy of Dr. Pollyann Savoy for evaluation of elevated transaminases.  Patient has had mildly elevated transaminases since January 2013. In November 2013 her AST and ALT were 107/231 respectively. She was on Vicodin-containing acetaminophen. She was therefore switched to OxyContin and Tylenol was laminated from her regiment. Repeat LFTs on 01/09/2012 revealed AST of 195 and ALT of 493. These are repeated again on 02/08/2012. AST was are normal at 20 and ALT was 51.  Patient states that she was taking no more than 1300 mg of Tylenol per day. Beginning of last year she might have been on higher doses never more than 2 g per day. She denies abdominal pain or pruritus. She has very good appetite. Since undergoing bariatric surgery in October 2012 she has lost close to 90 pounds. She does not remember having abnormal LFTs prior to surgery.  There is no history of tattoos, IV drug abuse or prior transfusion. She also denies history of jaundice of icteric hepatitis and family history is negative for chronic liver disease. She denies nausea vomiting heartburn dysphagia or skin rash. Her bowels move regularly and there is no history of melena or rectal bleeding.  She received hepatitis B vaccination when she was working at [employer] but does not recall that she had a followup test to confirm that she had responded.  Presently she is not able to exercise as she still has some visual symptoms and imbalance since she had last of her 3 strokes. She is planning to have surgery for PTO at [facility] next month.  Current Medications:  Current Outpatient Prescriptions   Medication  Sig  Dispense  Refill   .  ARIPiprazole (ABILIFY) 5 MG tablet  Take 5 mg by mouth every morning.     Marland Kitchen  aspirin EC 325 MG tablet  Take 1 tablet (325 mg total) by mouth daily.  30 tablet  1    .  Calcium Citrate-Vitamin D (CALCIUM CITRATE + PO)  Take 500 mg by mouth 3 (three) times daily. *Chewables*     .  citalopram (CELEXA) 40 MG tablet  Take 40 mg by mouth at bedtime.     .  Cyanocobalamin (B-12) 2500 MCG SUBL  Place 1 tablet under the tongue 2 (two) times a week.     .  docusate sodium (COLACE) 100 MG capsule  Take 100 mg by mouth 2 (two) times daily.     .  DULoxetine (CYMBALTA) 60 MG capsule  Take 60 mg by mouth daily.     Boris Lown Oil 500 MG CAPS  Take 1 capsule by mouth every evening.     Marland Kitchen  levothyroxine (SYNTHROID, LEVOTHROID) 150 MCG tablet  Take 150 mcg by mouth every morning.     .  methocarbamol (ROBAXIN) 500 MG tablet  Take 500 mg by mouth 4 (four) times daily.     .  Multiple Vitamins-Iron (MULTIVITAMIN/IRON PO)  Take 1 tablet by mouth 2 (two) times daily.     Marland Kitchen  oxyCODONE (OXYCONTIN) 10 MG 12 hr tablet  Take 10 mg by mouth every 6 (six) hours as needed.     .  topiramate (TOPAMAX) 100 MG tablet  Take 150 mg by mouth at bedtime.     .  Vitamin D, Ergocalciferol, (DRISDOL) 50000 UNITS CAPS  Take 50,000 Units by mouth every 7 (seven) days. *Taken  on Mondays     .  warfarin (COUMADIN) 1 MG tablet  Take 17 mg by mouth at bedtime.     Marland Kitchen  zolpidem (AMBIEN) 10 MG tablet  Take 10 mg by mouth at bedtime.     .  Zolpidem Tartrate (INTERMEZZO) 3.5 MG SUBL  Place 1 tablet under the tongue at bedtime as needed.      No current facility-administered medications for this visit.    Past medical history;  Hypothyroidism was diagnosed about 15 years ago.  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  Obesity. 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  History of B12 deficiency.  History of sleep apnea which has reversed with a weight loss and she is not requiring any therapy.  Patent foramen ovale.  History of CVA x3. First one was 8 years ago. She has had some visual symptoms and disequilibrium since her last CVA.  Chronic  insomnia.  History of depression.  Fibromyalgia.  Right DVT in October 2013.  Surgery on right lower extremity for weight) 25 years ago.  BSO with hysterectomy 20 years ago.  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  Allergies;  Allergies   Allergen  Reactions   .  Flexeril (Cyclobenzaprine Hcl)  Other (See Comments)     Numbness of extremities   .  Morphine And Related  Itching     Upper torso   .  Septra (Bactrim)  Itching and Rash     Upper torso   .  Sulfa Antibiotics  Itching and Rash     Itching was upper torso   Family history;  Mother has multiple health problems including fibromyalgia.  She has 2 brothers and one sister.  Her sister has diabetes hypertension and atrial fibrillation.  One brother has Crohn's disease DM and CAD and other brother has osteoarthrosis and some C. Kwell 8 of lung disease.  Social history;  She is married and has one daughter.  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  She smokes 6-7 cigarettes per day for 10 years but quit in 1993. She used to drink alcohol occasionally but hasn't had any in the last 18 months.  Objective:  Blood pressure 122/70, pulse 66, temperature 98.1 F (36.7 C), temperature source Oral, resp. rate 18, height 5\' 5"  (1.651 m), weight 239 lb 11.2 oz (108.727 kg).  Patient is alert and in ni acute distress. Conjunctiva is pink. Sclera is nonicteric  Oropharyngeal mucosa is normal.  No neck masses or thyromegaly noted.  Cardiac exam with regular rhythm normal S1 and S2. No murmur or gallop noted.  Lungs are clear to auscultation.  Abdomen;  No LE edema or clubbing noted.  Labs/studies Results:  See H&P.   Assessment:  Asymptomatic elevation of transaminases for one year with significant bump in November 2013. Mild chronic elevation may be related to steatohepatitis.  Bump in transaminases in November 2013 may be related to acetaminophen but she was not taking dose that would be considered potentially hepatotoxic dose.  Patient does not have stigmata of chronic liver disease.  Recommendations;  Hepatobiliary ultrasound.  LFTs.  Hepatitis B surface antigen, hepatitis B surface antibody and HCV antibody.

## 2012-03-25 ENCOUNTER — Ambulatory Visit (HOSPITAL_COMMUNITY)
Admission: RE | Admit: 2012-03-25 | Discharge: 2012-03-25 | Disposition: A | Discharge status: Home / Self Care | Payer: 59 | Source: Ambulatory Visit | Attending: Internal Medicine | Admitting: Internal Medicine

## 2012-03-25 DIAGNOSIS — K838 Other specified diseases of biliary tract: Secondary | ICD-10-CM | POA: Insufficient documentation

## 2012-03-25 DIAGNOSIS — R7989 Other specified abnormal findings of blood chemistry: Secondary | ICD-10-CM | POA: Insufficient documentation

## 2012-03-27 ENCOUNTER — Telehealth (INDEPENDENT_AMBULATORY_CARE_PROVIDER_SITE_OTHER): Payer: Self-pay | Admitting: *Deleted

## 2012-03-27 DIAGNOSIS — R7989 Other specified abnormal findings of blood chemistry: Secondary | ICD-10-CM

## 2012-03-27 NOTE — Telephone Encounter (Signed)
Per Dr.Rehman the patient will need to have LFT in 3 months

## 2012-04-07 ENCOUNTER — Other Ambulatory Visit (HOSPITAL_COMMUNITY): Payer: Self-pay | Admitting: Family Medicine

## 2012-04-07 ENCOUNTER — Ambulatory Visit (HOSPITAL_COMMUNITY)
Admission: RE | Admit: 2012-04-07 | Discharge: 2012-04-07 | Disposition: A | Discharge status: Home / Self Care | Payer: 59 | Source: Ambulatory Visit | Attending: Family Medicine | Admitting: Family Medicine

## 2012-04-07 DIAGNOSIS — I8000 Phlebitis and thrombophlebitis of superficial vessels of unspecified lower extremity: Secondary | ICD-10-CM | POA: Insufficient documentation

## 2012-04-07 DIAGNOSIS — I808 Phlebitis and thrombophlebitis of other sites: Secondary | ICD-10-CM

## 2012-04-16 ENCOUNTER — Other Ambulatory Visit (HOSPITAL_COMMUNITY): Payer: Self-pay | Admitting: *Deleted

## 2012-04-16 DIAGNOSIS — Q2111 Secundum atrial septal defect: Secondary | ICD-10-CM

## 2012-04-16 DIAGNOSIS — I999 Unspecified disorder of circulatory system: Secondary | ICD-10-CM

## 2012-04-16 DIAGNOSIS — Q211 Atrial septal defect: Secondary | ICD-10-CM

## 2012-04-18 ENCOUNTER — Ambulatory Visit (HOSPITAL_COMMUNITY): Payer: 59

## 2012-04-22 ENCOUNTER — Encounter (INDEPENDENT_AMBULATORY_CARE_PROVIDER_SITE_OTHER): Payer: Self-pay

## 2012-04-23 ENCOUNTER — Other Ambulatory Visit (HOSPITAL_COMMUNITY): Payer: Self-pay | Admitting: *Deleted

## 2012-04-23 DIAGNOSIS — I82409 Acute embolism and thrombosis of unspecified deep veins of unspecified lower extremity: Secondary | ICD-10-CM

## 2012-04-23 DIAGNOSIS — I639 Cerebral infarction, unspecified: Secondary | ICD-10-CM

## 2012-04-28 ENCOUNTER — Ambulatory Visit (HOSPITAL_COMMUNITY)
Admission: RE | Admit: 2012-04-28 | Discharge: 2012-04-28 | Disposition: A | Discharge status: Home / Self Care | Payer: 59 | Source: Ambulatory Visit | Attending: Family Medicine | Admitting: Family Medicine

## 2012-04-28 ENCOUNTER — Other Ambulatory Visit (HOSPITAL_COMMUNITY): Payer: Self-pay | Admitting: *Deleted

## 2012-04-28 ENCOUNTER — Ambulatory Visit (HOSPITAL_COMMUNITY): Admission: RE | Admit: 2012-04-28 | Payer: 59 | Source: Ambulatory Visit

## 2012-04-28 DIAGNOSIS — I809 Phlebitis and thrombophlebitis of unspecified site: Secondary | ICD-10-CM | POA: Insufficient documentation

## 2012-04-28 DIAGNOSIS — I82409 Acute embolism and thrombosis of unspecified deep veins of unspecified lower extremity: Secondary | ICD-10-CM | POA: Diagnosis not present

## 2012-04-28 MED ORDER — GADOFOSVESET TRISODIUM 244 MG/ML IV SOLN
13.0000 mL | Freq: Once | INTRAVENOUS | Status: AC
  Administered 2012-04-28: 13 mL via INTRAVENOUS

## 2012-05-01 DIAGNOSIS — M62838 Other muscle spasm: Secondary | ICD-10-CM | POA: Diagnosis not present

## 2012-05-01 DIAGNOSIS — R5381 Other malaise: Secondary | ICD-10-CM | POA: Diagnosis not present

## 2012-05-01 DIAGNOSIS — M542 Cervicalgia: Secondary | ICD-10-CM | POA: Diagnosis not present

## 2012-05-01 DIAGNOSIS — IMO0001 Reserved for inherently not codable concepts without codable children: Secondary | ICD-10-CM | POA: Diagnosis not present

## 2012-05-01 DIAGNOSIS — R5383 Other fatigue: Secondary | ICD-10-CM | POA: Diagnosis not present

## 2012-05-02 DIAGNOSIS — I82409 Acute embolism and thrombosis of unspecified deep veins of unspecified lower extremity: Secondary | ICD-10-CM | POA: Diagnosis not present

## 2012-05-02 DIAGNOSIS — Z5181 Encounter for therapeutic drug level monitoring: Secondary | ICD-10-CM | POA: Diagnosis not present

## 2012-05-02 DIAGNOSIS — Q211 Atrial septal defect: Secondary | ICD-10-CM | POA: Diagnosis not present

## 2012-05-02 DIAGNOSIS — I6350 Cerebral infarction due to unspecified occlusion or stenosis of unspecified cerebral artery: Secondary | ICD-10-CM | POA: Diagnosis not present

## 2012-05-15 ENCOUNTER — Ambulatory Visit: Payer: 59 | Admitting: *Deleted

## 2012-06-19 ENCOUNTER — Encounter (INDEPENDENT_AMBULATORY_CARE_PROVIDER_SITE_OTHER): Payer: Self-pay | Admitting: *Deleted

## 2012-06-19 ENCOUNTER — Other Ambulatory Visit (INDEPENDENT_AMBULATORY_CARE_PROVIDER_SITE_OTHER): Payer: Self-pay | Admitting: *Deleted

## 2012-06-19 DIAGNOSIS — R7989 Other specified abnormal findings of blood chemistry: Secondary | ICD-10-CM

## 2012-06-28 LAB — HEPATIC FUNCTION PANEL
ALT: 23 U/L (ref 0–35)
AST: 19 U/L (ref 0–37)
Albumin: 3.7 g/dL (ref 3.5–5.2)
Alkaline Phosphatase: 93 U/L (ref 39–117)
Bilirubin, Direct: 0.1 mg/dL (ref 0.0–0.3)
Indirect Bilirubin: 0.2 mg/dL (ref 0.0–0.9)
Total Bilirubin: 0.3 mg/dL (ref 0.3–1.2)
Total Protein: 6.4 g/dL (ref 6.0–8.3)

## 2012-08-18 ENCOUNTER — Encounter: Payer: 59 | Attending: Family Medicine | Admitting: *Deleted

## 2012-08-18 ENCOUNTER — Encounter: Payer: Self-pay | Admitting: *Deleted

## 2012-08-18 DIAGNOSIS — Z713 Dietary counseling and surveillance: Secondary | ICD-10-CM | POA: Insufficient documentation

## 2012-08-18 NOTE — Patient Instructions (Addendum)
Goals:  Follow Phase 3B: High Protein + Non-Starchy Vegetables  Avoid drinking 15 minutes before, during and 30 minutes after eating  Aim for >30 min of physical activity daily  Limit fruit to 1/2 cup a day; only a few days/week  Consider replacing 2-3 am snack with lean protein instead of carbs  Limit carbs to 15-20 g per meal; 15 g per snack

## 2012-08-18 NOTE — Progress Notes (Signed)
  Follow-up visit:  21 Months Post-Operative Gastric Bypass Surgery  Medical Nutrition Therapy:  Appt start time:  1500   End time:  1530  Primary concerns today: Post-operative bariatric surgery nutrition management.  Continues to consume excessive CHO daily in the form of sandwiches made with a large banana (30g CHO) and mayo on white bread (2x/day). States she has not wanted to watch her diet lately and is not sure why. Discussed getting back on track.   Surgery date: 11/20/10  Surgery type: Gastric Bypass  Start weight at [facility]: 321.8 lbs  Weight today: 255.5 lbs  Weight change: 18.0 lb GAIN Total weight lost: 66.3 lbs Weight goal: 175 lbs (Sz 12)   TANITA  BODY COMP RESULTS  05/31/11 08/02/11 11/22/11 02/20/12 08/18/12    BMI (kg/m^2) 42.1 40.7 39.9 39.5 42.5    FM (lbs) 133.0 125.0 118.0 117.5 128.0    FFM (lbs) 124.0 123.5 121.5 120.0 127.5    TBW (lbs) 91.0 90.5 89.0 88.0 93.5   24 Hour Recall:  B:  2 [place] weenies, Yoplait greek yogurt, 2-3 oz cheese S:  Yoplait greek yogurt L:   Lg banana on white bread w/ regular Duke's mayo, greek yogurt - 65g CHO S:  Yoplait greek yogurt, 5-6 calcium chews D:  1-2 oz Pork chop, 1/4 cup green beans, 1/4 cup corn S:  NONE or yogurt S (2-3 AM): Yogurt and/or banana sandwich (3-4)  Fluid intake: small [employer] Caramel frappe (1-2x/week), water, decaf iced tea w/ stevia, sweet tea when out to dinner (2-3 times/wk) = 90-100 oz  Estimated total protein intake = 85-100 g  Medications: See medication list. Reconciled with patient Supplementation: Taking B12/Calcium/MVI regularly  Using straws: No Drinking while eating: Sips with meals and inbetween Hair loss: No - resolved Carbonated beverages: no N/V/D/C: Mild daily constipation - uses Colace 2x/day to prevent constipation; BM daily Dumping syndrome: No  Recent physical activity:  3 days/week to pool most weeks; Tries to do treadmill 10-15 min on other days    Progress Towards Goal(s):  In  progress.   Nutritional Diagnosis:  [place]-3.3 Overweight/obesity As related to recent Gastric Bypass surgery.  As evidenced by pt following Gastric Bypass nutrition guidelines for continued weight loss.    Intervention:  Nutrition education.  Monitoring/Evaluation:  Dietary intake, exercise, and body weight. Follow up in 3 months for 24 month post-op visit.

## 2012-09-09 ENCOUNTER — Other Ambulatory Visit (HOSPITAL_COMMUNITY): Payer: Self-pay | Admitting: Physician Assistant

## 2012-09-09 DIAGNOSIS — I808 Phlebitis and thrombophlebitis of other sites: Secondary | ICD-10-CM

## 2012-09-09 DIAGNOSIS — G8929 Other chronic pain: Secondary | ICD-10-CM

## 2012-09-10 ENCOUNTER — Ambulatory Visit (HOSPITAL_COMMUNITY)
Admission: RE | Admit: 2012-09-10 | Discharge: 2012-09-10 | Disposition: A | Discharge status: Home / Self Care | Payer: 59 | Source: Ambulatory Visit | Attending: Physician Assistant | Admitting: Physician Assistant

## 2012-09-10 DIAGNOSIS — I808 Phlebitis and thrombophlebitis of other sites: Secondary | ICD-10-CM

## 2012-09-10 DIAGNOSIS — Z8672 Personal history of thrombophlebitis: Secondary | ICD-10-CM | POA: Insufficient documentation

## 2012-09-10 DIAGNOSIS — M79609 Pain in unspecified limb: Secondary | ICD-10-CM | POA: Insufficient documentation

## 2012-09-10 DIAGNOSIS — G8929 Other chronic pain: Secondary | ICD-10-CM

## 2012-10-02 DIAGNOSIS — M5137 Other intervertebral disc degeneration, lumbosacral region: Secondary | ICD-10-CM | POA: Diagnosis not present

## 2012-10-02 DIAGNOSIS — IMO0001 Reserved for inherently not codable concepts without codable children: Secondary | ICD-10-CM | POA: Diagnosis not present

## 2012-10-02 DIAGNOSIS — M1710 Unilateral primary osteoarthritis, unspecified knee: Secondary | ICD-10-CM | POA: Diagnosis not present

## 2012-10-02 DIAGNOSIS — G4700 Insomnia, unspecified: Secondary | ICD-10-CM | POA: Diagnosis not present

## 2012-10-10 DIAGNOSIS — IMO0001 Reserved for inherently not codable concepts without codable children: Secondary | ICD-10-CM | POA: Diagnosis not present

## 2012-10-10 DIAGNOSIS — Z23 Encounter for immunization: Secondary | ICD-10-CM | POA: Diagnosis not present

## 2012-10-10 DIAGNOSIS — G8929 Other chronic pain: Secondary | ICD-10-CM | POA: Diagnosis not present

## 2012-10-10 DIAGNOSIS — Z6841 Body Mass Index (BMI) 40.0 and over, adult: Secondary | ICD-10-CM | POA: Diagnosis not present

## 2012-11-19 ENCOUNTER — Encounter (INDEPENDENT_AMBULATORY_CARE_PROVIDER_SITE_OTHER): Payer: Self-pay | Admitting: Surgery

## 2012-11-19 ENCOUNTER — Ambulatory Visit (INDEPENDENT_AMBULATORY_CARE_PROVIDER_SITE_OTHER): Payer: 59 | Admitting: Surgery

## 2012-11-19 VITALS — BP 126/74 | HR 60 | Temp 98.0°F | Resp 18 | Ht 65.0 in | Wt 252.8 lb

## 2012-11-19 DIAGNOSIS — Z9884 Bariatric surgery status: Secondary | ICD-10-CM

## 2012-11-19 NOTE — Progress Notes (Signed)
[facility]  [name], MD,  FACS [address] [place]  [address] [place], [place]    [address] Phone:  [phone] FAX:  [phone]  ASSESSMENT AND PLAN: 1. Morbid obesity.   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  She's down + 50 pounds since surgery.  She is about 2 years out from surgery and has gained about 15 pounds since I last saw her.  We'll give her the labs we suggest checking.  She has physical with Dr. Phillips Odor soon and she will get these through his office.  She'll see me back in one year.  2. History of small strokes with patent foramen ovale.   Have been stable on aspirin.  She's on the 81 mg.   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 3. Patent foramen ovale.   Plans repair of this by Dr. Romeo Apple at [facility] after she comes off Xarelto  The repair date is to be around March 2015.  She said this should time out over 6 months since she had negative LE doppler exams. 4. Sleep apnea, but off CPAP 5. Fibromyalgia - on disability.   On oxycodone 10 mg QID. 6. Bilateral chronic knee pain. See's Dr. Tana Felts office.  She has had some shots of Syndisk in the knees.  She also takes Lodine for pain.    Doing better since weight loss. 7. Hypothyroid, on replacement. This has been recently adjusted by Dr. Phillips Odor.  8. Disability.  9. Chronic pain medicine. On oxycodone  10. Depression. ADHD. She says she saw someone who said she did not have ADHD, but she can not remember her name.   She has a flat affect. 11. Small HH on UGI. 12.  Memory issues - Sees Debbie Pineda of [facility] in [place] for this. 14.  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  HISTORY OF PRESENT ILLNESS: Chief Complaint  Patient presents with  . Bariatric Follow Up    RNY 11-20-10   Debbie Pineda is a 52 y.o. (DOB: 01/23/1960)  white female who is a patient of GOLDING, Chancy Hurter,  MD and comes to me today for follow-up for RYGB.  She is doing well from the weight loss.  She still has trouble with meats.  She can eat 4 or 5 bites, then feels nauseated.  I actually suggested that she use this as a govenor for weight.  Eggs make her nauseated.  So she get her protein from [place] yogurt. We talked about exercise - which she struggle to get.  Though it sounds like she uses the pool.  Note that she is driving, but has a visual field defect.  Current Outpatient Prescriptions  Medication Sig Dispense Refill  . ARIPiprazole (ABILIFY) 5 MG tablet Take 5 mg by mouth every morning.       . buprenorphine (BUTRANS) 10 MCG/HR PTWK patch Place 10 mcg onto the skin once a week.      . citalopram (CELEXA) 40 MG tablet Take 40 mg by mouth at bedtime.       . Cyanocobalamin (B-12) 2500 MCG SUBL Place 1 tablet under the tongue 2 (two) times a week.      . docusate sodium (COLACE) 100 MG capsule Take 100 mg by mouth 2 (two) times daily.      . DULoxetine (CYMBALTA) 60 MG capsule Take 60 mg by  mouth daily.      Boris Lown Oil 500 MG CAPS Take 1 capsule by mouth every evening.      Marland Kitchen levothyroxine (SYNTHROID, LEVOTHROID) 150 MCG tablet Take 150 mcg by mouth every morning.       . methocarbamol (ROBAXIN) 500 MG tablet Take 500 mg by mouth 4 (four) times daily.        . Multiple Vitamins-Iron (MULTIVITAMIN/IRON PO) Take 1 tablet by mouth 2 (two) times daily.        Marland Kitchen topiramate (TOPAMAX) 100 MG tablet Take 150 mg by mouth at bedtime.       . Vitamin D, Ergocalciferol, (DRISDOL) 50000 UNITS CAPS Take 50,000 Units by mouth every 7 (seven) days. *Taken on Mondays      . zolpidem (AMBIEN) 10 MG tablet Take 10 mg by mouth at bedtime.       . Calcium Citrate-Vitamin D (CALCIUM CITRATE + PO) Take 500 mg by mouth 3 (three) times daily. *Chewables*      . OxyCODONE HCl (OXYCONTIN PO) Take 10 mg by mouth every 4 (four) hours as needed.      . Zolpidem Tartrate (INTERMEZZO) 3.5 MG SUBL Place 1 tablet under the  tongue at bedtime as needed.       No current facility-administered medications for this visit.   PHYSICAL EXAM: BP 126/74  Pulse 60  Temp(Src) 98 F (36.7 C)  Resp 18  Ht 5\' 5"  (1.651 m)  Wt 252 lb 12.8 oz (114.669 kg)  BMI 42.07 kg/m2  General: WN WF who is alert and generally healthy appearing.  HEENT: Normal. Pupils equal.  Lungs: Clear to auscultation and symmetric breath sounds. Heart:  RRR. No murmur or rub. Abdomen: Soft. No mass. No tenderness. No hernia. Normal bowel sounds.  Abdominal incisions look good. Psychiatric: Seems to have poor memory.   LABS: No new data.

## 2012-11-25 ENCOUNTER — Ambulatory Visit: Payer: 59 | Admitting: *Deleted

## 2012-11-26 ENCOUNTER — Encounter: Payer: 59 | Attending: Surgery | Admitting: Dietician

## 2012-11-26 DIAGNOSIS — Z713 Dietary counseling and surveillance: Secondary | ICD-10-CM | POA: Insufficient documentation

## 2012-11-26 NOTE — Progress Notes (Signed)
  Follow-up visit:  24 Months Post-Operative Gastric Bypass Surgery  Medical Nutrition Therapy:  Appt start time:  845   End time:  915  Primary concerns today: Post-operative bariatric surgery nutrition management.  Feels like things are pretty stagnant right now. Having problems with depression. Was off medication for 3 months but is now back on. Eating less now that she is on her medication. Eating more d/t boredom than hunger. States she is not interested in having recommendation for a therapist for depression.   Surgery date: 11/20/10  Surgery type: Gastric Bypass  Start weight at [facility]: 321.8 lbs  Weight today: 253.5lbs  Weight change: 2 lbs Total weight lost: 68.3 lbs Weight goal: 175 lbs (Sz 12)   TANITA  BODY COMP RESULTS  05/31/11 08/02/11 11/22/11 02/20/12 08/18/12 11/26/12    BMI (kg/m^2) 42.1 40.7 39.9 39.5 42.5 42.2    FM (lbs) 133.0 125.0 118.0 117.5 128.0 126.5    FFM (lbs) 124.0 123.5 121.5 120.0 127.5 127.0    TBW (lbs) 91.0 90.5 89.0 88.0 93.5 93.0   24 Hour Recall:  B:  2 [place] weenies, Yoplait greek yogurt, 2-3 oz cheese S:  Yoplait greek yogurt L:   Lg banana on white bread w/ regular [employer] mayo, greek yogurt - 65g CHO S:  Yoplait greek yogurt, 5-6 calcium chews D:  1-2 oz Pork chop, 1/4 cup green beans, 1/4 cup corn S:  NONE or yogurt S (2-3 AM): Yogurt and/or banana sandwich (3-4)  Fluid intake:  water, decaf iced tea w/ stevia, sweet tea when out to dinner (2-3 times/wk) = 90-100 oz  Estimated total protein intake = 85-100 g  Medications: See medication list. Reconciled with patient Supplementation: Taking B12/MVI regularly, not taking calcium d/t yogurt  Using straws: No Drinking while eating: Sips with meals and inbetween Hair loss: No - resolved Carbonated beverages: no N/V/D/C: Mild daily constipation - uses Colace 2x/day to prevent constipation; BM daily Dumping syndrome: No  Recent physical activity:  None  Progress Towards Goal(s):  In  progress.   Nutritional Diagnosis:  [place]-3.3 Overweight/obesity As related to recent Gastric Bypass surgery.  As evidenced by pt following Gastric Bypass nutrition guidelines for continued weight loss.    Intervention:  Nutrition education.  Monitoring/Evaluation:  Dietary intake, exercise, and body weight. Follow up in 3 months for 27 month post-op visit.

## 2012-11-26 NOTE — Patient Instructions (Addendum)
Goals:  Follow Bariatric Surgery Specialized Post-Op Diet (ideas)  Aim for maximum of 15 grams of carbs per meal/10-15 grams per snack  Avoid white starches and starchy veggies (potatoes, peas, corn, etc), and bananas  Avoid sweetened drinks  Eat 3-6 small meals/snacks, every 3-5 hrs  No meal skipping  Increase lean protein foods to meet 60-80g goal  Always have a protein source with carbs  Increase fluid intake to 64oz +  Aim for >30 min of physical activity daily    Resume daily vitamin supplementation   *(1) complete multivitamin with iron and 2-3 doses of 500 mg calcium citrate  * Make sure to separate the multivitamin and calcium by 2 hours to prevent iron/calcium from binding together and leaving your body  * Make sure to take your calcium in 3 separate doses because your body cannot absorb more than 500 mg at a time

## 2012-11-27 ENCOUNTER — Other Ambulatory Visit: Payer: Self-pay

## 2012-12-15 DIAGNOSIS — R413 Other amnesia: Secondary | ICD-10-CM | POA: Diagnosis not present

## 2012-12-15 DIAGNOSIS — G4700 Insomnia, unspecified: Secondary | ICD-10-CM | POA: Diagnosis not present

## 2012-12-15 DIAGNOSIS — Q211 Atrial septal defect: Secondary | ICD-10-CM | POA: Diagnosis not present

## 2012-12-15 DIAGNOSIS — I6330 Cerebral infarction due to thrombosis of unspecified cerebral artery: Secondary | ICD-10-CM | POA: Diagnosis not present

## 2012-12-26 DIAGNOSIS — G8929 Other chronic pain: Secondary | ICD-10-CM | POA: Diagnosis not present

## 2012-12-26 DIAGNOSIS — IMO0001 Reserved for inherently not codable concepts without codable children: Secondary | ICD-10-CM | POA: Diagnosis not present

## 2012-12-26 DIAGNOSIS — Z6841 Body Mass Index (BMI) 40.0 and over, adult: Secondary | ICD-10-CM | POA: Diagnosis not present

## 2013-01-05 ENCOUNTER — Encounter (INDEPENDENT_AMBULATORY_CARE_PROVIDER_SITE_OTHER): Payer: Self-pay | Admitting: *Deleted

## 2013-01-29 ENCOUNTER — Other Ambulatory Visit (INDEPENDENT_AMBULATORY_CARE_PROVIDER_SITE_OTHER): Payer: Self-pay | Admitting: *Deleted

## 2013-01-29 DIAGNOSIS — Z1211 Encounter for screening for malignant neoplasm of colon: Secondary | ICD-10-CM

## 2013-01-30 DIAGNOSIS — G8929 Other chronic pain: Secondary | ICD-10-CM | POA: Diagnosis not present

## 2013-01-30 DIAGNOSIS — Z6841 Body Mass Index (BMI) 40.0 and over, adult: Secondary | ICD-10-CM | POA: Diagnosis not present

## 2013-01-30 LAB — CBC WITH DIFFERENTIAL/PLATELET
Basophils Absolute: 0 10*3/uL (ref 0.0–0.1)
Basophils Relative: 0 % (ref 0–1)
Eosinophils Absolute: 0.2 10*3/uL (ref 0.0–0.7)
Eosinophils Relative: 2 % (ref 0–5)
HCT: 39.3 % (ref 36.0–46.0)
Hemoglobin: 12.8 g/dL (ref 12.0–15.0)
Lymphocytes Relative: 33 % (ref 12–46)
Lymphs Abs: 2.3 10*3/uL (ref 0.7–4.0)
MCH: 28.2 pg (ref 26.0–34.0)
MCHC: 32.6 g/dL (ref 30.0–36.0)
MCV: 86.6 fL (ref 78.0–100.0)
Monocytes Absolute: 0.4 10*3/uL (ref 0.1–1.0)
Monocytes Relative: 6 % (ref 3–12)
Neutro Abs: 4 10*3/uL (ref 1.7–7.7)
Neutrophils Relative %: 59 % (ref 43–77)
Platelets: 265 10*3/uL (ref 150–400)
RBC: 4.54 MIL/uL (ref 3.87–5.11)
RDW: 14.3 % (ref 11.5–15.5)
WBC: 6.9 10*3/uL (ref 4.0–10.5)

## 2013-01-30 LAB — MAGNESIUM: Magnesium: 1.9 mg/dL (ref 1.5–2.5)

## 2013-01-30 LAB — IRON AND TIBC
%SAT: 15 % — ABNORMAL LOW (ref 20–55)
Iron: 73 ug/dL (ref 42–145)
TIBC: 497 ug/dL — ABNORMAL HIGH (ref 250–470)
UIBC: 424 ug/dL — ABNORMAL HIGH (ref 125–400)

## 2013-01-31 LAB — COMPREHENSIVE METABOLIC PANEL
ALT: 26 U/L (ref 0–35)
AST: 17 U/L (ref 0–37)
Albumin: 4 g/dL (ref 3.5–5.2)
Alkaline Phosphatase: 100 U/L (ref 39–117)
BUN: 22 mg/dL (ref 6–23)
CO2: 24 mEq/L (ref 19–32)
Calcium: 8.9 mg/dL (ref 8.4–10.5)
Chloride: 102 mEq/L (ref 96–112)
Creat: 0.89 mg/dL (ref 0.50–1.10)
Glucose, Bld: 90 mg/dL (ref 70–99)
Potassium: 4.7 mEq/L (ref 3.5–5.3)
Sodium: 134 mEq/L — ABNORMAL LOW (ref 135–145)
Total Bilirubin: 0.3 mg/dL (ref 0.3–1.2)
Total Protein: 6.4 g/dL (ref 6.0–8.3)

## 2013-01-31 LAB — VITAMIN B12: Vitamin B-12: 1096 pg/mL — ABNORMAL HIGH (ref 211–911)

## 2013-01-31 LAB — FOLATE: Folate: 6.4 ng/mL

## 2013-02-04 ENCOUNTER — Encounter (INDEPENDENT_AMBULATORY_CARE_PROVIDER_SITE_OTHER): Payer: Self-pay | Admitting: *Deleted

## 2013-02-04 ENCOUNTER — Telehealth (INDEPENDENT_AMBULATORY_CARE_PROVIDER_SITE_OTHER): Payer: Self-pay | Admitting: *Deleted

## 2013-02-04 DIAGNOSIS — Z1211 Encounter for screening for malignant neoplasm of colon: Secondary | ICD-10-CM

## 2013-02-04 LAB — VITAMIN B1: Vitamin B1 (Thiamine): 12 nmol/L (ref 8–30)

## 2013-02-04 MED ORDER — PEG 3350-KCL-NA BICARB-NACL 420 G PO SOLR: 4000.0000 mL | Freq: Once | ORAL | Status: DC

## 2013-02-04 NOTE — Telephone Encounter (Signed)
Patient needs trilyte 

## 2013-02-22 IMAGING — US US EXTREM LOW VENOUS*R*
1 series · 13 of 24 positions shown · non-contrast
Comparison: 10/24/2011

CLINICAL DATA: Right leg pain and swelling, past history of
superficial thrombophlebitis right greater saphenous vein

RIGHT LOWER EXTREMITY VENOUS DUPLEX ULTRASOUND
TECHNIQUE: Gray-scale sonography with graded compression, as well
as color Doppler and duplex ultrasound, were performed to evaluate
the deep venous system of the lower extremity from the level of the
common femoral vein through the popliteal and proximal calf veins.
Spectral Doppler was utilized to evaluate flow at rest and with
distal augmentation maneuvers.

[Series 1: us extrem low venous*right* · 0.09mm/px · 13 of 31 slices shown]
[im 1/31]
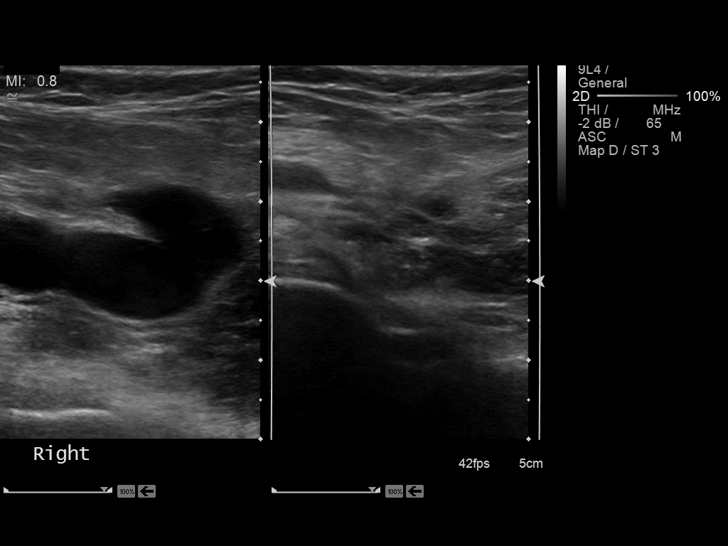
[im 3/31]
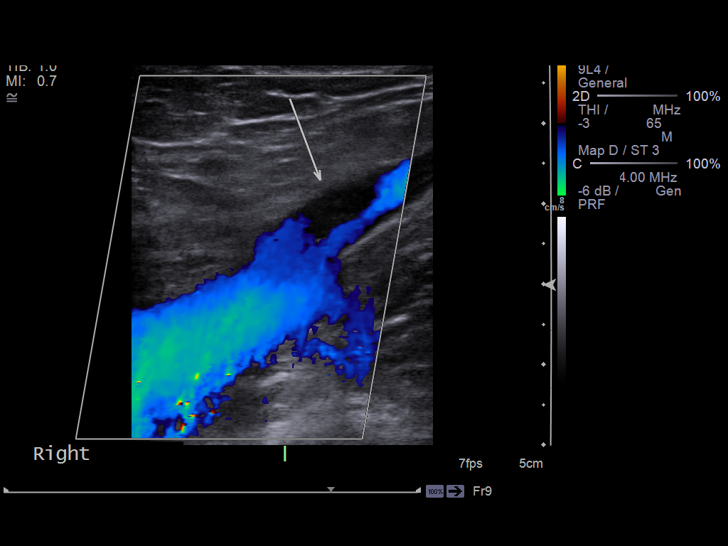
[im 6/31]
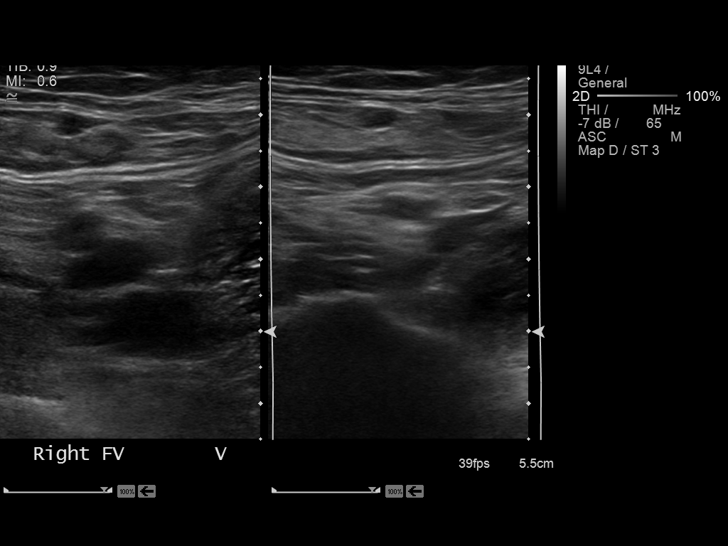
[im 8/31]
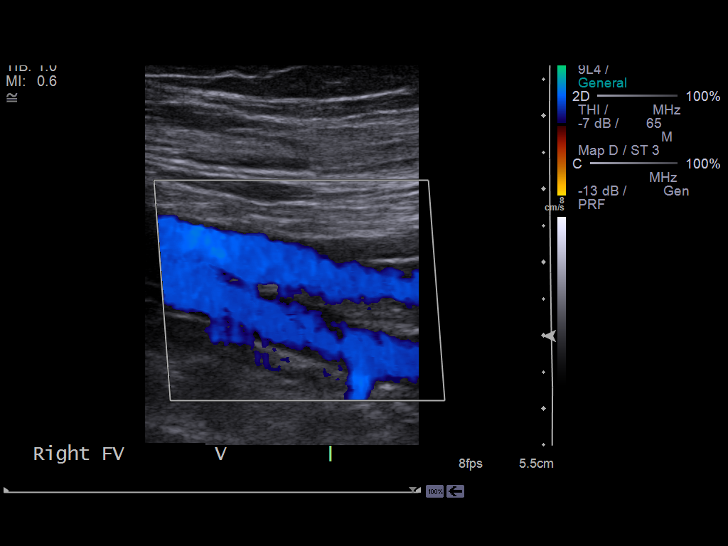
[im 11/31]
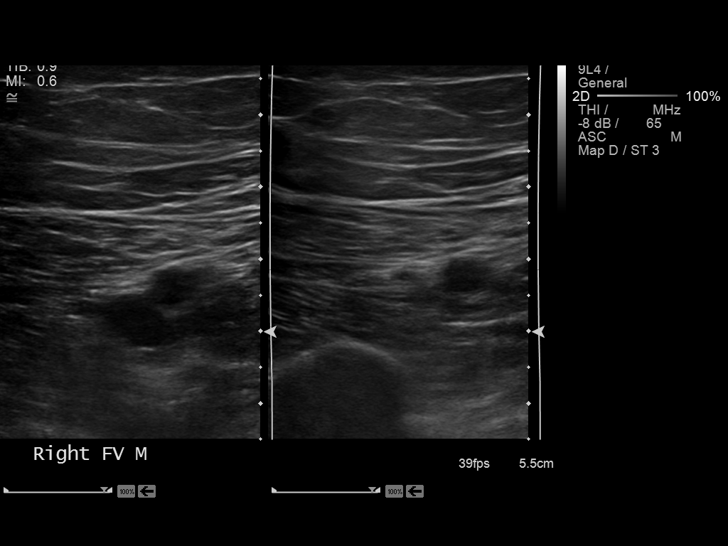
[im 14/31]
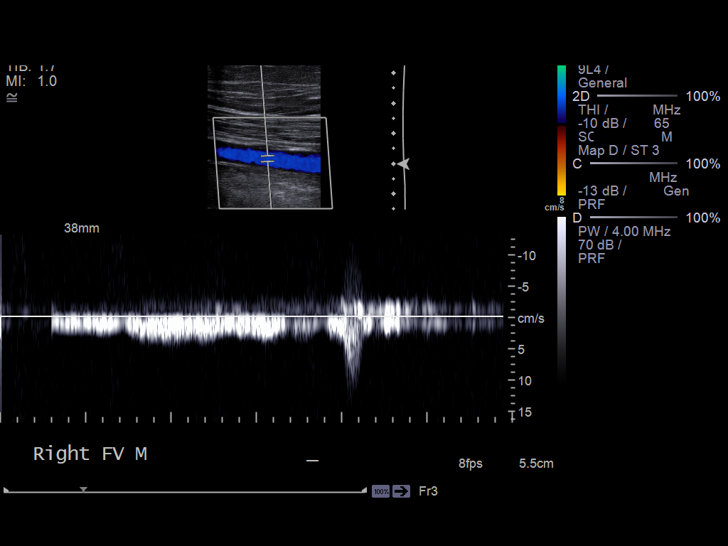
[im 16/31]
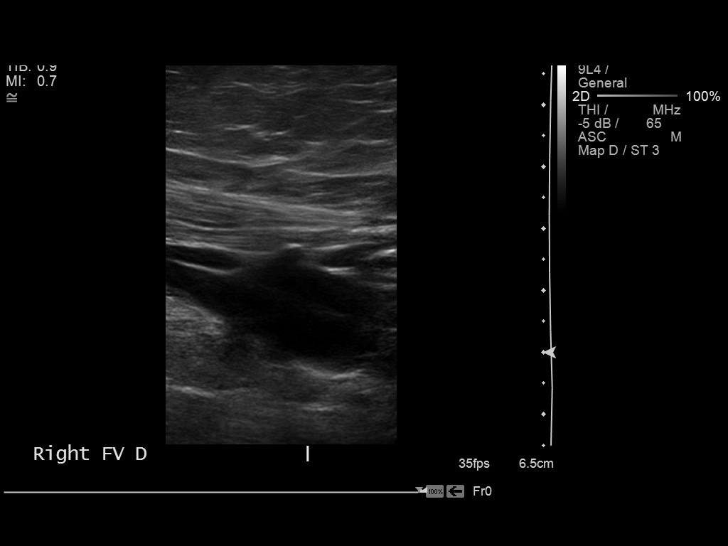
[im 17/31]
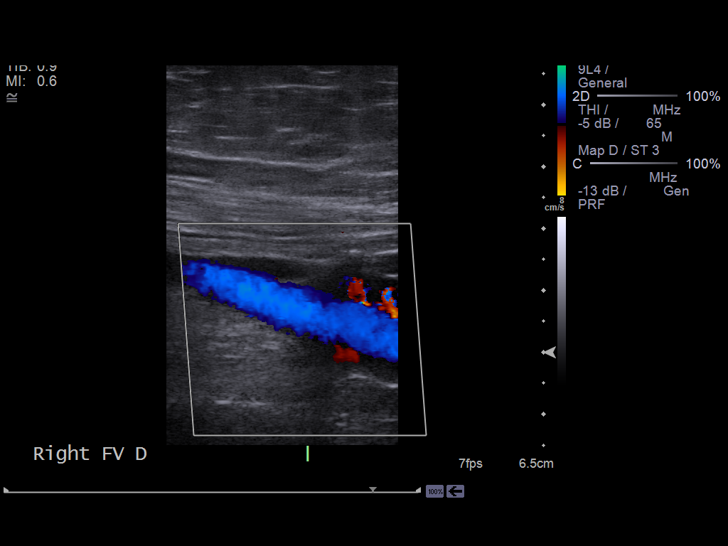
[im 20/31]
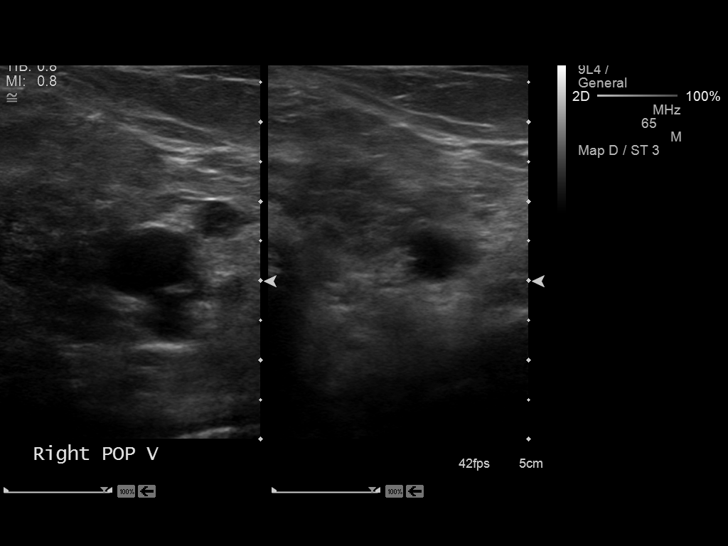
[im 23/31]
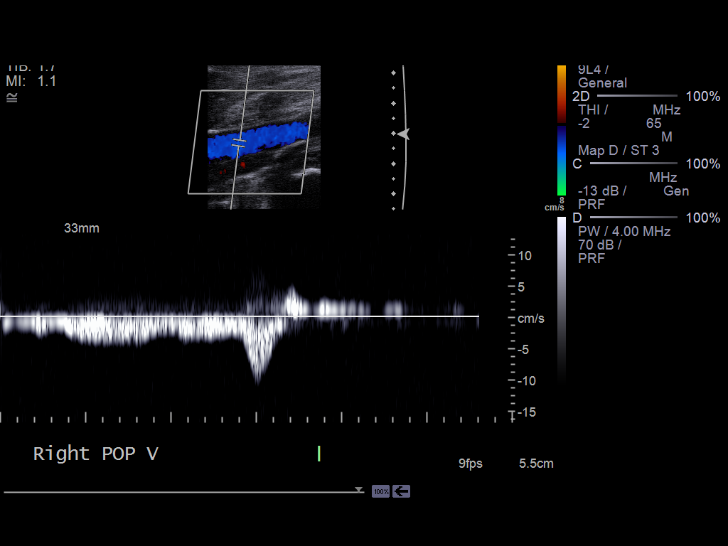
[im 25/31]
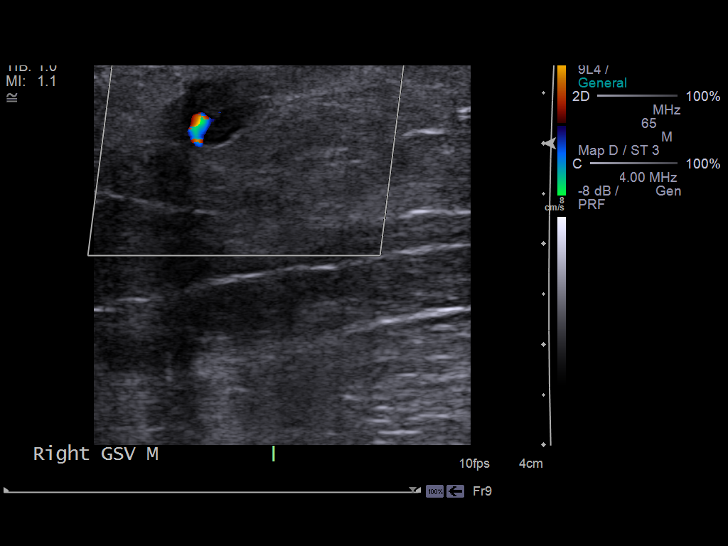
[im 28/31]
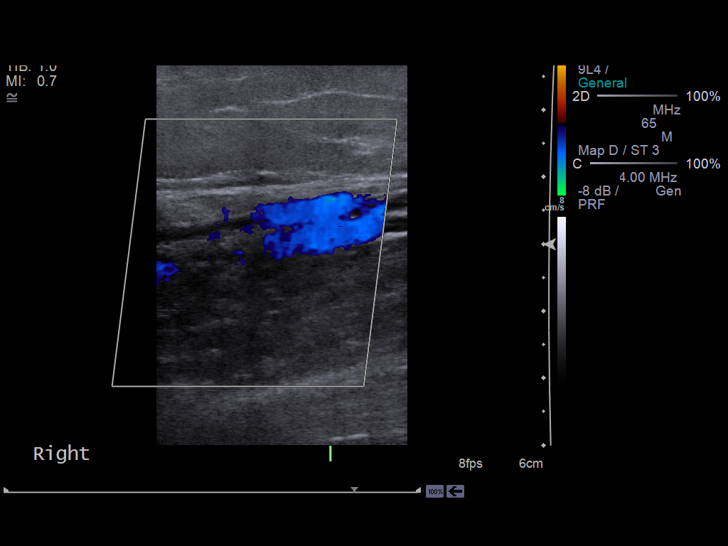
[im 31/31]
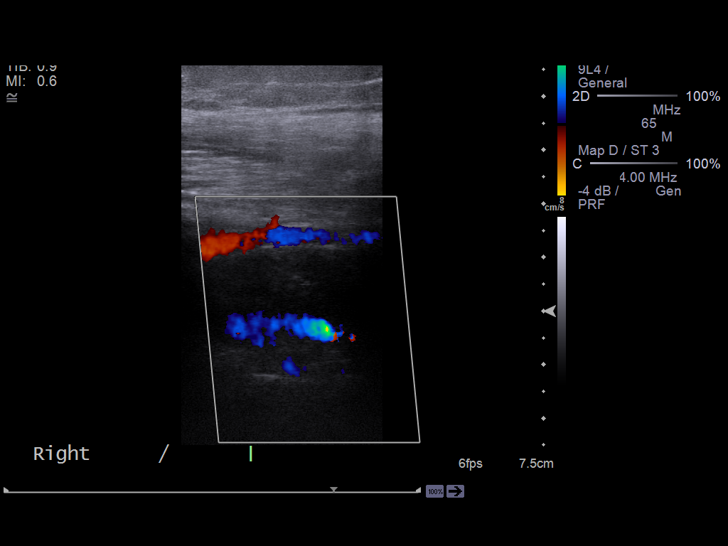

[13 of 24 positions shown; findings below may reference images not displayed]

FINDINGS: Deep venous system patent and compressible from right groin through
popliteal fossa.
Spontaneous venous flow present with intact augmentation and
evidence of respiratory phasicity.
No intraluminal thrombus identified within the deep venous system.

Hypoechoic thrombus is identified within the right greater
saphenous vein from proximal to distal in the right thigh.
Thrombus at the proximal right greater saphenous vein is
nonocclusive and extends to within 6 mm of the saphenofemoral
junction.
No extension of clot into the common femoral vein is identified.
Involve portions of the greater saphenous vein are noncompressible
and demonstrate diminished or absent spontaneous venous flow.
IMPRESSION: No evidence of deep venous thrombosis in the right lower extremity.
Superficial thrombophlebitis of the right greater saphenous vein
extending to within 6 mm of the saphenofemoral junction, a similar
distribution to that seen on the previous exam; no evidence of
propagation since previous study.

## 2013-02-26 ENCOUNTER — Encounter: Payer: BC Managed Care – PPO | Attending: Surgery | Admitting: Dietician

## 2013-02-26 ENCOUNTER — Ambulatory Visit: Payer: 59 | Admitting: Dietician

## 2013-02-26 DIAGNOSIS — Z713 Dietary counseling and surveillance: Secondary | ICD-10-CM | POA: Insufficient documentation

## 2013-02-26 NOTE — Progress Notes (Signed)
  Follow-up visit:  27 Months Post-Operative Gastric Bypass Surgery  Medical Nutrition Therapy:  Appt start time:  1000   End time:  1030  Primary concerns today: Post-operative bariatric surgery nutrition management.  Teyana feels like things are still pretty stagnant right now. She is trying to watch her portions and reduce CHO intake. She eats about every 2 hours, mainly Yoplait 100-calorie yogurt. She eats several 40-calorie Fudgsicles a day. No longer taking supplements. Still having problems with depression. Her doctor has increased her dosage of Cymbalta. She states she is not interested in having recommendation for a therapist for depression at this point. Would rather wait and see how increase in Cymbalta dose goes.  Surgery date: 11/20/10  Surgery type: Gastric Bypass  Start weight at [facility]: 321.8 lbs  Weight today: 255.5lbs  Weight change: 2 lbs increase Total weight lost: 66.3 lbs Weight goal: 175 lbs (Sz 12)   TANITA  BODY COMP RESULTS  05/31/11 08/02/11 11/22/11 02/20/12 08/18/12 11/26/12 02/26/2013    BMI (kg/m^2) 42.1 40.7 39.9 39.5 42.5 42.2 42.5    FM (lbs) 133.0 125.0 118.0 117.5 128.0 126.5 126.5    FFM (lbs) 124.0 123.5 121.5 120.0 127.5 127.0 129    TBW (lbs) 91.0 90.5 89.0 88.0 93.5 93.0 94.5   24 Hour Recall:  B:  2 [place] weenies, Yoplait greek yogurt, 2-3 oz cheese (21-28g) S:  Yoplait greek yogurt (10g) L:   Leftovers from previous night's meal; sometimes banana sandwiches S:  Yoplait greek yogurt (10g) D:  Grilled or pan-fried chicken or pork chops, 1/4 cup green beans, 1/4 cup corn (14-21g) S:  Fudgsicle or yogurt (0-10g) S (2-3 AM): Yogurt and/or banana sandwich (0-10g)  Fluid intake:  water, coffee, decaf iced tea w/ stevia, sweet tea when out to dinner (2 times/wk) = 90-100 oz  Estimated total protein intake = 55-90 g  Medications: See list; increased Cymbalta dosage Supplementation: just taking MVI  Using straws: Yes, when out to dinner Drinking while  eating: Yes Hair loss: No Carbonated beverages: no N/V/D/C: Mild daily constipation - uses Colace 2x/day to prevent constipation; BM daily Dumping syndrome: No  Recent physical activity:  None  Progress Towards Goal(s):  In progress.   Nutritional Diagnosis:  [place]-3.3 Overweight/obesity As related to recent Gastric Bypass surgery.  As evidenced by pt following Gastric Bypass nutrition guidelines for continued weight loss.    Intervention:  Nutrition education.  Monitoring/Evaluation:  Dietary intake, exercise, and body weight. Follow up as needed.

## 2013-02-26 NOTE — Patient Instructions (Addendum)
Goals:  Follow Bariatric Surgery Specialized Post-Op Diet (ideas)  Aim for maximum of 15 grams of carbs per meal/10-15 grams per snack  Try having 1/2 banana sandwiches  Avoid white starches and starchy veggies (potatoes, peas, corn, etc), and bananas  Limit sweetened drinks   Increase lean protein foods to meet 60-80g goal  Always have a protein source with carbs   Increase fluid intake to 64oz +   Start exercising; Aim for >30 min of physical activity daily; start going to the pool!    Resume daily vitamin supplementation   *(1) complete multivitamin with iron and 2-3 doses of 500 mg calcium citrate  * Make sure to separate the multivitamin and calcium by 2 hours to prevent iron/calcium from binding together and leaving your body  * Make sure to take your calcium in 3 separate doses because your body cannot absorb more than 500 mg at a time

## 2013-03-04 ENCOUNTER — Telehealth (INDEPENDENT_AMBULATORY_CARE_PROVIDER_SITE_OTHER): Payer: Self-pay | Admitting: *Deleted

## 2013-03-04 NOTE — Telephone Encounter (Signed)
  Procedure: tcs  Reason/Indication:  screening  Has patient had this procedure before?  Yes, more than 5 years ago  If so, when, by whom and where?    Is there a family history of colon cancer?  No   Who?  What age when diagnosed?    Is patient diabetic?   no      Does patient have prosthetic heart valve?  no  Do you have a pacemaker?  no  Has patient ever had endocarditis? no  Has patient had joint replacement within last 12 months?  no  Does patient tend to be constipated or take laxatives? no  Is patient on Coumadin, Plavix and/or Aspirin? yes  Medications: see EPIC  Allergies: see EPIC  Medication Adjustment: xarelto 2 days  Procedure date & time: 03/25/13 at 930

## 2013-03-05 NOTE — Telephone Encounter (Signed)
Agree 

## 2013-03-05 NOTE — Telephone Encounter (Signed)
Why are we doing a colonoscopy. ? Polyps, family hx?

## 2013-03-06 ENCOUNTER — Other Ambulatory Visit (HOSPITAL_COMMUNITY): Payer: Self-pay | Admitting: Family Medicine

## 2013-03-06 DIAGNOSIS — Z6841 Body Mass Index (BMI) 40.0 and over, adult: Secondary | ICD-10-CM | POA: Diagnosis not present

## 2013-03-06 DIAGNOSIS — Z0000 Encounter for general adult medical examination without abnormal findings: Secondary | ICD-10-CM | POA: Diagnosis not present

## 2013-03-06 DIAGNOSIS — G8929 Other chronic pain: Secondary | ICD-10-CM

## 2013-03-09 ENCOUNTER — Ambulatory Visit (HOSPITAL_COMMUNITY): Payer: BC Managed Care – PPO

## 2013-03-09 ENCOUNTER — Encounter (HOSPITAL_COMMUNITY): Payer: Self-pay | Admitting: Pharmacy Technician

## 2013-03-10 ENCOUNTER — Encounter (HOSPITAL_COMMUNITY): Payer: Self-pay | Admitting: Pharmacy Technician

## 2013-03-23 DIAGNOSIS — I6340 Cerebral infarction due to embolism of unspecified cerebral artery: Secondary | ICD-10-CM | POA: Diagnosis not present

## 2013-03-23 DIAGNOSIS — Q211 Atrial septal defect: Secondary | ICD-10-CM | POA: Diagnosis not present

## 2013-03-23 DIAGNOSIS — Q2111 Secundum atrial septal defect: Secondary | ICD-10-CM | POA: Diagnosis not present

## 2013-03-23 DIAGNOSIS — I82409 Acute embolism and thrombosis of unspecified deep veins of unspecified lower extremity: Secondary | ICD-10-CM | POA: Diagnosis not present

## 2013-03-25 ENCOUNTER — Ambulatory Visit (HOSPITAL_COMMUNITY)
Admission: RE | Admit: 2013-03-25 | Discharge: 2013-03-25 | Disposition: A | Discharge status: Home / Self Care | Payer: BC Managed Care – PPO | Source: Ambulatory Visit | Attending: Internal Medicine | Admitting: Internal Medicine

## 2013-03-25 ENCOUNTER — Encounter (HOSPITAL_COMMUNITY): Payer: Self-pay | Admitting: *Deleted

## 2013-03-25 ENCOUNTER — Encounter (HOSPITAL_COMMUNITY)
Admission: RE | Disposition: A | Discharge status: Home / Self Care | Payer: Self-pay | Source: Ambulatory Visit | Attending: Internal Medicine

## 2013-03-25 DIAGNOSIS — K644 Residual hemorrhoidal skin tags: Secondary | ICD-10-CM | POA: Insufficient documentation

## 2013-03-25 DIAGNOSIS — Z6841 Body Mass Index (BMI) 40.0 and over, adult: Secondary | ICD-10-CM | POA: Insufficient documentation

## 2013-03-25 DIAGNOSIS — Z1211 Encounter for screening for malignant neoplasm of colon: Secondary | ICD-10-CM | POA: Insufficient documentation

## 2013-03-25 DIAGNOSIS — R7309 Other abnormal glucose: Secondary | ICD-10-CM | POA: Insufficient documentation

## 2013-03-25 HISTORY — DX: Patent foramen ovale: Q21.12

## 2013-03-25 HISTORY — DX: Major depressive disorder, single episode, unspecified: F32.9

## 2013-03-25 HISTORY — PX: COLONOSCOPY: SHX5424

## 2013-03-25 HISTORY — DX: Depression, unspecified: F32.A

## 2013-03-25 HISTORY — DX: Atrial septal defect: Q21.1

## 2013-03-25 SURGERY — COLONOSCOPY
Anesthesia: Moderate Sedation

## 2013-03-25 MED ORDER — SIMETHICONE 40 MG/0.6ML PO SUSP
ORAL | Status: DC | PRN
  Administered 2013-03-25: 09:00:00

## 2013-03-25 MED ORDER — SODIUM CHLORIDE 0.9 % IV SOLN
INTRAVENOUS | Status: DC
  Administered 2013-03-25: 09:00:00 via INTRAVENOUS

## 2013-03-25 MED ORDER — MEPERIDINE HCL 50 MG/ML IJ SOLN
INTRAMUSCULAR | Status: DC | PRN
  Administered 2013-03-25: 25 mg via INTRAVENOUS

## 2013-03-25 MED ORDER — MIDAZOLAM HCL 5 MG/5ML IJ SOLN
INTRAMUSCULAR | Status: AC
  Filled 2013-03-25: qty 10

## 2013-03-25 MED ORDER — MIDAZOLAM HCL 5 MG/5ML IJ SOLN
INTRAMUSCULAR | Status: DC | PRN
  Administered 2013-03-25 (×5): 2 mg via INTRAVENOUS

## 2013-03-25 MED ORDER — MEPERIDINE HCL 50 MG/ML IJ SOLN
INTRAMUSCULAR | Status: AC
  Filled 2013-03-25: qty 1

## 2013-03-25 NOTE — H&P (Signed)
Debbie Pineda is an 53 y.o. female.   Chief Complaint: Patient is here for colonoscopy. HPI: Patient is 53 year old Caucasian female who is in for screening colonoscopy. She denies abdominal pain change in bowel habits or rectal bleeding. Family history is negative for colorectal carcinoma. Brother has Crohn's disease. Patient has been off Xarelto for 2 days.  Past Medical History  Diagnosis Date  . Degenerative disk disease   . Degenerative joint disease   . Hypothyroid   . Hyperglycemia   . Morbid obesity   . Thrombophlebitis   . Fibromyalgia   . Stroke 10/2011  . Anemia     Past history of anemia  . PFO (patent foramen ovale)   . Depression     Past Surgical History  Procedure Laterality Date  . Cholecystectomy    . Abdominal hysterectomy    . Knee surgery      Left knee with pinning  . Sclerosing of vein      Rt leg  . 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 Exploratory laparotomy    . Right knee surgery      Knee arthroscopy    Family History  Problem Relation Age of Onset  . Cancer Father     brain  . Heart disease Father   . Heart disease Brother   . Colon cancer Neg Hx    Social History:  reports that she quit smoking about 22 years ago. Her smoking use included Cigarettes. She has a 5 pack-year smoking history. She has never used smokeless tobacco. She reports that she does not drink alcohol or use illicit drugs.  Allergies:  Allergies  Allergen Reactions  . Flexeril [Cyclobenzaprine Hcl] Other (See Comments)    Numbness of extremities  . Morphine And Related Itching    Upper torso  . Septra [Bactrim] Itching and Rash    Upper torso  . Sulfa Antibiotics Itching and Rash    Itching was upper torso    Medications Prior to Admission  Medication Sig Dispense Refill  . ARIPiprazole (ABILIFY) 5 MG tablet Take 5 mg by mouth every morning.       . Buprenorphine 15 MCG/HR PTWK Place 1 patch onto the skin every Tuesday.      . citalopram (CELEXA) 40 MG  tablet Take 40 mg by mouth at bedtime.       . Cyanocobalamin ([employer] B-12) 1500 MCG TBDP Place 1 tablet under the tongue 2 (two) times a week.      . docusate sodium (COLACE) 100 MG capsule Take 100 mg by mouth 2 (two) times daily.      . DULoxetine (CYMBALTA) 30 MG capsule Take 90 mg by mouth daily.      Marland Kitchen ibuprofen (ADVIL,MOTRIN) 200 MG tablet Take 600 mg by mouth 2 (two) times daily as needed for headache, mild pain or moderate pain.      Marland Kitchen ketoconazole (NIZORAL) 2 % cream Apply 1 application topically 2 (two) times daily. Applied underneath breast as needed      . Krill Oil 300 MG CAPS Take 300 mg by mouth at bedtime.      Marland Kitchen levothyroxine (SYNTHROID, LEVOTHROID) 150 MCG tablet Take 150 mcg by mouth every morning.       . methocarbamol (ROBAXIN) 500 MG tablet Take 500 mg by mouth every 6 (six) hours as needed for muscle spasms (pain).       . OxyCODONE HCl (OXYCONTIN PO) Take 10 mg by mouth every 4 (four) hours  as needed (for pain).       . Phosphatidylserine-DHA-EPA (VAYACOG) 100-19.5-6.5 MG CAPS Take 1 capsule by mouth daily.      . polyethylene glycol-electrolytes (NULYTELY/GOLYTELY) 420 G solution Take 4,000 mLs by mouth once.  4000 mL  0  . rivaroxaban (XARELTO) 10 MG TABS tablet Take 10 mg by mouth daily.       Marland Kitchen topiramate (TOPAMAX) 100 MG tablet Take 150 mg by mouth at bedtime.       . Vitamin D, Ergocalciferol, (DRISDOL) 50000 UNITS CAPS Take 50,000 Units by mouth every 7 (seven) days. *Taken on Mondays      . Cyanocobalamin (B-12) 2500 MCG SUBL Place 1 tablet under the tongue 2 (two) times a week.      . Multiple Vitamins-Iron (MULTIVITAMIN/IRON PO) Take 1 tablet by mouth 2 (two) times daily.        Marland Kitchen zolpidem (AMBIEN) 10 MG tablet Take 10 mg by mouth at bedtime as needed for sleep.         No results found for this or any previous visit (from the past 48 hour(s)). No results found.  ROS  Blood pressure 109/74, pulse 68, temperature 98.4 F (36.9 C), temperature source Oral,  resp. rate 8, height 5\' 5"  (1.651 m), weight 250 lb (113.399 kg), SpO2 99.00%. Physical Exam  Constitutional: She appears well-developed and well-nourished.  HENT:  Mouth/Throat: Oropharynx is clear and moist.  Eyes: Conjunctivae are normal. No scleral icterus.  Neck: No thyromegaly present.  Cardiovascular: Normal rate, regular rhythm and normal heart sounds.   No murmur heard. Respiratory: Effort normal and breath sounds normal.  GI: Soft. She exhibits no distension and no mass. There is no tenderness.  Musculoskeletal: She exhibits no edema.  Lymphadenopathy:    She has no cervical adenopathy.  Neurological: She is alert.  Skin: Skin is warm and dry.     Assessment/Plan Average risk screening colonoscopy.  Sahid Borba U 03/25/2013, 9:03 AM

## 2013-03-25 NOTE — Discharge Instructions (Signed)
Resume usual medications including Xarelto; keep the ibuprofen use to minimum. Resume usual diet. No driving for 24 hours. Next screening exam in 10 years.  Colonoscopy, Care After Refer to this sheet in the next few weeks. These instructions provide you with information on caring for yourself after your procedure. Your health care provider may also give you more specific instructions. Your treatment has been planned according to current medical practices, but problems sometimes occur. Call your health care provider if you have any problems or questions after your procedure. WHAT TO EXPECT AFTER THE PROCEDURE  After your procedure, it is typical to have the following:  A small amount of blood in your stool.  Moderate amounts of gas and mild abdominal cramping or bloating. HOME CARE INSTRUCTIONS  Do not drive, operate machinery, or sign important documents for 24 hours.  You may shower and resume your regular physical activities, but move at a slower pace for the first 24 hours.  Take frequent rest periods for the first 24 hours.  Walk around or put a warm pack on your abdomen to help reduce abdominal cramping and bloating.  Drink enough fluids to keep your urine clear or pale yellow.  You may resume your normal diet as instructed by your health care provider. Avoid heavy or fried foods that are hard to digest.  Avoid drinking alcohol for 24 hours or as instructed by your health care provider.  Only take over-the-counter or prescription medicines as directed by your health care provider.  If a tissue sample (biopsy) was taken during your procedure:  Do not take aspirin or blood thinners for 7 days, or as instructed by your health care provider.  Do not drink alcohol for 7 days, or as instructed by your health care provider.  Eat soft foods for the first 24 hours. SEEK MEDICAL CARE IF: You have persistent spotting of blood in your stool 2 3 days after the procedure. SEEK  IMMEDIATE MEDICAL CARE IF:  You have more than a small spotting of blood in your stool.  You pass large blood clots in your stool.  Your abdomen is swollen (distended).  You have nausea or vomiting.  You have a fever.  You have increasing abdominal pain that is not relieved with medicine.

## 2013-03-25 NOTE — Op Note (Signed)
COLONOSCOPY PROCEDURE REPORT  PATIENT:  Debbie Pineda  MR#:  [id number] Birthdate:  17-Jul-1960, 53 y.o., female Endoscopist:  Dr. Rogene Houston, MD Referred By:  Dr. Purvis Kilts, MD Procedure Date: 03/25/2013  Procedure:   Colonoscopy  Indications: Patient is 53 year old Caucasian female is undergoing average risk screening colonoscopy. Patient has been off Xarelto for 2 days.  Informed Consent:  The procedure and risks were reviewed with the patient and informed consent was obtained.  Medications:  Demerol 25 mg IV Versed 10 mg IV  Description of procedure:  After a digital rectal exam was performed, that colonoscope was advanced from the anus through the rectum and colon to the area of the cecum, ileocecal valve and appendiceal orifice. The cecum was deeply intubated. These structures were well-seen and photographed for the record. From the level of the cecum and ileocecal valve, the scope was slowly and cautiously withdrawn. The mucosal surfaces were carefully surveyed utilizing scope tip to flexion to facilitate fold flattening as needed. The scope was pulled down into the rectum where a thorough exam including retroflexion was performed.  Findings:   Prep satisfactory. Normal mucosa of various segments of colon and rectum. Small hemorrhoids below the dentate line.   Therapeutic/Diagnostic Maneuvers Performed: None  Complications:  None  Cecal Withdrawal Time:  9 minutes  Impression:  Normal colonoscopy except small external hemorrhoids.  Recommendations:  Standard instructions given. Next screening exam in 10 years.  Nikaela Coyne U  03/25/2013 9:37 AM  CC: Dr. Hilma Favors, Betsy Coder, MD & Dr. Rayne Du ref. provider found

## 2013-03-27 ENCOUNTER — Encounter (HOSPITAL_COMMUNITY): Payer: Self-pay | Admitting: Internal Medicine

## 2013-03-31 ENCOUNTER — Other Ambulatory Visit (HOSPITAL_COMMUNITY): Payer: Self-pay | Admitting: Family Medicine

## 2013-03-31 DIAGNOSIS — Z139 Encounter for screening, unspecified: Secondary | ICD-10-CM

## 2013-03-31 DIAGNOSIS — Z1231 Encounter for screening mammogram for malignant neoplasm of breast: Secondary | ICD-10-CM

## 2013-04-02 DIAGNOSIS — IMO0001 Reserved for inherently not codable concepts without codable children: Secondary | ICD-10-CM | POA: Diagnosis not present

## 2013-04-02 DIAGNOSIS — G4700 Insomnia, unspecified: Secondary | ICD-10-CM | POA: Diagnosis not present

## 2013-04-02 DIAGNOSIS — M76899 Other specified enthesopathies of unspecified lower limb, excluding foot: Secondary | ICD-10-CM | POA: Diagnosis not present

## 2013-04-02 DIAGNOSIS — R5383 Other fatigue: Secondary | ICD-10-CM | POA: Diagnosis not present

## 2013-04-02 DIAGNOSIS — R5381 Other malaise: Secondary | ICD-10-CM | POA: Diagnosis not present

## 2013-04-03 DIAGNOSIS — G8929 Other chronic pain: Secondary | ICD-10-CM | POA: Diagnosis not present

## 2013-04-03 DIAGNOSIS — Z6841 Body Mass Index (BMI) 40.0 and over, adult: Secondary | ICD-10-CM | POA: Diagnosis not present

## 2013-04-10 ENCOUNTER — Ambulatory Visit (HOSPITAL_COMMUNITY)
Admission: RE | Admit: 2013-04-10 | Discharge: 2013-04-10 | Disposition: A | Discharge status: Home / Self Care | Payer: BC Managed Care – PPO | Source: Ambulatory Visit | Attending: Family Medicine | Admitting: Family Medicine

## 2013-04-10 DIAGNOSIS — Z1231 Encounter for screening mammogram for malignant neoplasm of breast: Secondary | ICD-10-CM | POA: Insufficient documentation

## 2013-04-10 DIAGNOSIS — Q2111 Secundum atrial septal defect: Secondary | ICD-10-CM | POA: Diagnosis not present

## 2013-04-10 DIAGNOSIS — R413 Other amnesia: Secondary | ICD-10-CM | POA: Diagnosis not present

## 2013-04-10 DIAGNOSIS — I6330 Cerebral infarction due to thrombosis of unspecified cerebral artery: Secondary | ICD-10-CM | POA: Diagnosis not present

## 2013-04-10 DIAGNOSIS — G4700 Insomnia, unspecified: Secondary | ICD-10-CM | POA: Diagnosis not present

## 2013-04-10 DIAGNOSIS — Q211 Atrial septal defect: Secondary | ICD-10-CM | POA: Diagnosis not present

## 2013-04-28 DIAGNOSIS — G894 Chronic pain syndrome: Secondary | ICD-10-CM | POA: Diagnosis not present

## 2013-04-28 DIAGNOSIS — IMO0001 Reserved for inherently not codable concepts without codable children: Secondary | ICD-10-CM | POA: Diagnosis not present

## 2013-04-28 DIAGNOSIS — M25569 Pain in unspecified knee: Secondary | ICD-10-CM | POA: Diagnosis not present

## 2013-04-28 DIAGNOSIS — M62838 Other muscle spasm: Secondary | ICD-10-CM | POA: Diagnosis not present

## 2013-04-28 DIAGNOSIS — M25579 Pain in unspecified ankle and joints of unspecified foot: Secondary | ICD-10-CM | POA: Diagnosis not present

## 2013-05-04 ENCOUNTER — Other Ambulatory Visit (HOSPITAL_COMMUNITY): Payer: Self-pay | Admitting: Physician Assistant

## 2013-05-04 ENCOUNTER — Ambulatory Visit (HOSPITAL_COMMUNITY)
Admission: RE | Admit: 2013-05-04 | Discharge: 2013-05-04 | Disposition: A | Discharge status: Home / Self Care | Payer: BC Managed Care – PPO | Source: Ambulatory Visit | Attending: Physician Assistant | Admitting: Physician Assistant

## 2013-05-04 DIAGNOSIS — Z8673 Personal history of transient ischemic attack (TIA), and cerebral infarction without residual deficits: Secondary | ICD-10-CM

## 2013-05-04 DIAGNOSIS — I6350 Cerebral infarction due to unspecified occlusion or stenosis of unspecified cerebral artery: Secondary | ICD-10-CM

## 2013-05-04 DIAGNOSIS — M79609 Pain in unspecified limb: Secondary | ICD-10-CM | POA: Insufficient documentation

## 2013-05-04 DIAGNOSIS — G8929 Other chronic pain: Secondary | ICD-10-CM | POA: Diagnosis not present

## 2013-05-04 DIAGNOSIS — Z6841 Body Mass Index (BMI) 40.0 and over, adult: Secondary | ICD-10-CM | POA: Diagnosis not present

## 2013-05-04 DIAGNOSIS — Z139 Encounter for screening, unspecified: Secondary | ICD-10-CM

## 2013-05-13 ENCOUNTER — Ambulatory Visit (HOSPITAL_COMMUNITY)
Admission: RE | Admit: 2013-05-13 | Discharge: 2013-05-13 | Disposition: A | Discharge status: Home / Self Care | Payer: BC Managed Care – PPO | Source: Ambulatory Visit | Attending: Physician Assistant | Admitting: Physician Assistant

## 2013-05-13 DIAGNOSIS — Z139 Encounter for screening, unspecified: Secondary | ICD-10-CM | POA: Insufficient documentation

## 2013-05-28 DIAGNOSIS — M25579 Pain in unspecified ankle and joints of unspecified foot: Secondary | ICD-10-CM | POA: Diagnosis not present

## 2013-05-28 DIAGNOSIS — M79609 Pain in unspecified limb: Secondary | ICD-10-CM | POA: Diagnosis not present

## 2013-06-04 DIAGNOSIS — R262 Difficulty in walking, not elsewhere classified: Secondary | ICD-10-CM | POA: Diagnosis not present

## 2013-06-04 DIAGNOSIS — M25579 Pain in unspecified ankle and joints of unspecified foot: Secondary | ICD-10-CM | POA: Diagnosis not present

## 2013-06-04 DIAGNOSIS — M79609 Pain in unspecified limb: Secondary | ICD-10-CM | POA: Diagnosis not present

## 2013-06-05 DIAGNOSIS — Z6841 Body Mass Index (BMI) 40.0 and over, adult: Secondary | ICD-10-CM | POA: Diagnosis not present

## 2013-06-05 DIAGNOSIS — G8929 Other chronic pain: Secondary | ICD-10-CM | POA: Diagnosis not present

## 2013-06-12 DIAGNOSIS — I82819 Embolism and thrombosis of superficial veins of unspecified lower extremities: Secondary | ICD-10-CM | POA: Diagnosis not present

## 2013-06-12 DIAGNOSIS — Q2111 Secundum atrial septal defect: Secondary | ICD-10-CM | POA: Diagnosis not present

## 2013-06-12 DIAGNOSIS — I809 Phlebitis and thrombophlebitis of unspecified site: Secondary | ICD-10-CM | POA: Diagnosis not present

## 2013-06-12 DIAGNOSIS — I6350 Cerebral infarction due to unspecified occlusion or stenosis of unspecified cerebral artery: Secondary | ICD-10-CM | POA: Diagnosis not present

## 2013-06-12 DIAGNOSIS — Q211 Atrial septal defect: Secondary | ICD-10-CM | POA: Diagnosis not present

## 2013-06-18 DIAGNOSIS — C9300 Acute monoblastic/monocytic leukemia, not having achieved remission: Secondary | ICD-10-CM | POA: Diagnosis not present

## 2013-06-18 DIAGNOSIS — M79609 Pain in unspecified limb: Secondary | ICD-10-CM | POA: Diagnosis not present

## 2013-06-18 DIAGNOSIS — M25579 Pain in unspecified ankle and joints of unspecified foot: Secondary | ICD-10-CM | POA: Diagnosis not present

## 2013-07-07 DIAGNOSIS — Z6841 Body Mass Index (BMI) 40.0 and over, adult: Secondary | ICD-10-CM | POA: Diagnosis not present

## 2013-07-07 DIAGNOSIS — G4700 Insomnia, unspecified: Secondary | ICD-10-CM | POA: Diagnosis not present

## 2013-07-07 DIAGNOSIS — G8929 Other chronic pain: Secondary | ICD-10-CM | POA: Diagnosis not present

## 2013-08-06 DIAGNOSIS — G8929 Other chronic pain: Secondary | ICD-10-CM | POA: Diagnosis not present

## 2013-08-06 DIAGNOSIS — Z6841 Body Mass Index (BMI) 40.0 and over, adult: Secondary | ICD-10-CM | POA: Diagnosis not present

## 2013-08-13 ENCOUNTER — Telehealth: Payer: Self-pay

## 2013-08-13 NOTE — Telephone Encounter (Signed)
Debbie Pineda called and said that she had received a letter from [facility] stating that Lovastatin would be cheaper than Provastatin. Would Lovastatin be as good as Pravastatin and is this something that Dr Dion Saucier would recommend.

## 2013-08-21 DIAGNOSIS — I6350 Cerebral infarction due to unspecified occlusion or stenosis of unspecified cerebral artery: Secondary | ICD-10-CM | POA: Diagnosis not present

## 2013-08-21 DIAGNOSIS — M329 Systemic lupus erythematosus, unspecified: Secondary | ICD-10-CM | POA: Diagnosis not present

## 2013-08-21 DIAGNOSIS — R413 Other amnesia: Secondary | ICD-10-CM | POA: Diagnosis not present

## 2013-08-21 DIAGNOSIS — G4700 Insomnia, unspecified: Secondary | ICD-10-CM | POA: Diagnosis not present

## 2013-08-21 DIAGNOSIS — Q211 Atrial septal defect: Secondary | ICD-10-CM | POA: Diagnosis not present

## 2013-08-21 DIAGNOSIS — Q2111 Secundum atrial septal defect: Secondary | ICD-10-CM | POA: Diagnosis not present

## 2013-09-04 DIAGNOSIS — Z6841 Body Mass Index (BMI) 40.0 and over, adult: Secondary | ICD-10-CM | POA: Diagnosis not present

## 2013-09-04 DIAGNOSIS — G8929 Other chronic pain: Secondary | ICD-10-CM | POA: Diagnosis not present

## 2013-10-01 DIAGNOSIS — E039 Hypothyroidism, unspecified: Secondary | ICD-10-CM | POA: Diagnosis not present

## 2013-10-01 DIAGNOSIS — J029 Acute pharyngitis, unspecified: Secondary | ICD-10-CM | POA: Diagnosis not present

## 2013-10-01 DIAGNOSIS — Z6841 Body Mass Index (BMI) 40.0 and over, adult: Secondary | ICD-10-CM | POA: Diagnosis not present

## 2013-10-01 DIAGNOSIS — G8929 Other chronic pain: Secondary | ICD-10-CM | POA: Diagnosis not present

## 2013-10-20 DIAGNOSIS — Z23 Encounter for immunization: Secondary | ICD-10-CM | POA: Diagnosis not present

## 2013-10-20 DIAGNOSIS — Z6841 Body Mass Index (BMI) 40.0 and over, adult: Secondary | ICD-10-CM | POA: Diagnosis not present

## 2013-10-20 DIAGNOSIS — J019 Acute sinusitis, unspecified: Secondary | ICD-10-CM | POA: Diagnosis not present

## 2013-10-30 DIAGNOSIS — Z6841 Body Mass Index (BMI) 40.0 and over, adult: Secondary | ICD-10-CM | POA: Diagnosis not present

## 2013-10-30 DIAGNOSIS — G894 Chronic pain syndrome: Secondary | ICD-10-CM | POA: Diagnosis not present

## 2013-10-30 DIAGNOSIS — K121 Other forms of stomatitis: Secondary | ICD-10-CM | POA: Diagnosis not present

## 2013-11-04 DIAGNOSIS — M159 Polyosteoarthritis, unspecified: Secondary | ICD-10-CM

## 2013-11-04 DIAGNOSIS — M797 Fibromyalgia: Secondary | ICD-10-CM | POA: Diagnosis not present

## 2013-11-04 HISTORY — DX: Polyosteoarthritis, unspecified: M15.9

## 2013-11-12 ENCOUNTER — Other Ambulatory Visit (INDEPENDENT_AMBULATORY_CARE_PROVIDER_SITE_OTHER): Payer: Self-pay | Admitting: Physician Assistant

## 2013-11-12 DIAGNOSIS — Z9884 Bariatric surgery status: Secondary | ICD-10-CM

## 2013-11-25 ENCOUNTER — Other Ambulatory Visit (HOSPITAL_COMMUNITY): Payer: Self-pay | Admitting: Urology

## 2013-11-25 DIAGNOSIS — Q6210 Congenital occlusion of ureter, unspecified: Secondary | ICD-10-CM

## 2013-11-25 DIAGNOSIS — R39198 Other difficulties with micturition: Secondary | ICD-10-CM

## 2013-11-27 ENCOUNTER — Ambulatory Visit (HOSPITAL_COMMUNITY): Payer: BC Managed Care – PPO

## 2013-12-01 DIAGNOSIS — N390 Urinary tract infection, site not specified: Secondary | ICD-10-CM | POA: Diagnosis not present

## 2013-12-01 DIAGNOSIS — Z6841 Body Mass Index (BMI) 40.0 and over, adult: Secondary | ICD-10-CM | POA: Diagnosis not present

## 2013-12-01 DIAGNOSIS — R339 Retention of urine, unspecified: Secondary | ICD-10-CM | POA: Diagnosis not present

## 2013-12-01 DIAGNOSIS — G894 Chronic pain syndrome: Secondary | ICD-10-CM | POA: Diagnosis not present

## 2013-12-03 ENCOUNTER — Ambulatory Visit (HOSPITAL_COMMUNITY)
Admission: RE | Admit: 2013-12-03 | Discharge: 2013-12-03 | Disposition: A | Discharge status: Home / Self Care | Payer: BC Managed Care – PPO | Source: Ambulatory Visit | Attending: Urology | Admitting: Urology

## 2013-12-03 DIAGNOSIS — N135 Crossing vessel and stricture of ureter without hydronephrosis: Secondary | ICD-10-CM | POA: Insufficient documentation

## 2013-12-03 DIAGNOSIS — R3989 Other symptoms and signs involving the genitourinary system: Secondary | ICD-10-CM | POA: Diagnosis not present

## 2013-12-03 DIAGNOSIS — Q6210 Congenital occlusion of ureter, unspecified: Secondary | ICD-10-CM

## 2013-12-03 DIAGNOSIS — R39198 Other difficulties with micturition: Secondary | ICD-10-CM

## 2013-12-04 ENCOUNTER — Ambulatory Visit (HOSPITAL_COMMUNITY): Payer: BC Managed Care – PPO

## 2013-12-09 ENCOUNTER — Ambulatory Visit (HOSPITAL_COMMUNITY)
Admission: RE | Admit: 2013-12-09 | Discharge: 2013-12-09 | Disposition: A | Discharge status: Home / Self Care | Payer: BC Managed Care – PPO | Source: Ambulatory Visit | Attending: Physician Assistant | Admitting: Physician Assistant

## 2013-12-09 ENCOUNTER — Other Ambulatory Visit (HOSPITAL_COMMUNITY): Payer: Self-pay | Admitting: Physician Assistant

## 2013-12-09 DIAGNOSIS — R0609 Other forms of dyspnea: Secondary | ICD-10-CM | POA: Insufficient documentation

## 2013-12-09 DIAGNOSIS — R42 Dizziness and giddiness: Secondary | ICD-10-CM

## 2013-12-09 DIAGNOSIS — R0602 Shortness of breath: Secondary | ICD-10-CM | POA: Insufficient documentation

## 2013-12-09 DIAGNOSIS — R0789 Other chest pain: Secondary | ICD-10-CM | POA: Diagnosis not present

## 2013-12-09 DIAGNOSIS — Z6841 Body Mass Index (BMI) 40.0 and over, adult: Secondary | ICD-10-CM | POA: Diagnosis not present

## 2013-12-09 DIAGNOSIS — F17210 Nicotine dependence, cigarettes, uncomplicated: Secondary | ICD-10-CM | POA: Insufficient documentation

## 2013-12-14 DIAGNOSIS — Q211 Atrial septal defect: Secondary | ICD-10-CM | POA: Diagnosis not present

## 2013-12-14 DIAGNOSIS — R002 Palpitations: Secondary | ICD-10-CM

## 2013-12-14 HISTORY — DX: Palpitations: R00.2

## 2014-01-01 ENCOUNTER — Ambulatory Visit (HOSPITAL_COMMUNITY)
Admission: RE | Admit: 2014-01-01 | Discharge: 2014-01-01 | Disposition: A | Discharge status: Home / Self Care | Payer: BC Managed Care – PPO | Source: Ambulatory Visit | Attending: Family Medicine | Admitting: Family Medicine

## 2014-01-01 ENCOUNTER — Other Ambulatory Visit (HOSPITAL_COMMUNITY): Payer: Self-pay | Admitting: Family Medicine

## 2014-01-01 DIAGNOSIS — R600 Localized edema: Secondary | ICD-10-CM

## 2014-01-01 DIAGNOSIS — M7989 Other specified soft tissue disorders: Secondary | ICD-10-CM | POA: Diagnosis present

## 2014-01-01 DIAGNOSIS — M79671 Pain in right foot: Secondary | ICD-10-CM

## 2014-01-01 DIAGNOSIS — R339 Retention of urine, unspecified: Secondary | ICD-10-CM | POA: Diagnosis not present

## 2014-01-06 DIAGNOSIS — N3941 Urge incontinence: Secondary | ICD-10-CM | POA: Diagnosis not present

## 2014-01-06 DIAGNOSIS — M545 Low back pain: Secondary | ICD-10-CM | POA: Diagnosis not present

## 2014-01-19 DIAGNOSIS — Z6841 Body Mass Index (BMI) 40.0 and over, adult: Secondary | ICD-10-CM | POA: Diagnosis not present

## 2014-01-19 DIAGNOSIS — G894 Chronic pain syndrome: Secondary | ICD-10-CM | POA: Diagnosis not present

## 2014-02-11 DIAGNOSIS — M797 Fibromyalgia: Secondary | ICD-10-CM | POA: Diagnosis not present

## 2014-02-11 DIAGNOSIS — M5137 Other intervertebral disc degeneration, lumbosacral region: Secondary | ICD-10-CM | POA: Diagnosis not present

## 2014-02-11 DIAGNOSIS — M19071 Primary osteoarthritis, right ankle and foot: Secondary | ICD-10-CM | POA: Diagnosis not present

## 2014-02-11 DIAGNOSIS — M542 Cervicalgia: Secondary | ICD-10-CM | POA: Diagnosis not present

## 2014-02-16 DIAGNOSIS — Z6841 Body Mass Index (BMI) 40.0 and over, adult: Secondary | ICD-10-CM | POA: Diagnosis not present

## 2014-02-16 DIAGNOSIS — G894 Chronic pain syndrome: Secondary | ICD-10-CM | POA: Diagnosis not present

## 2014-03-18 DIAGNOSIS — Z6841 Body Mass Index (BMI) 40.0 and over, adult: Secondary | ICD-10-CM | POA: Diagnosis not present

## 2014-03-18 DIAGNOSIS — I6350 Cerebral infarction due to unspecified occlusion or stenosis of unspecified cerebral artery: Secondary | ICD-10-CM | POA: Diagnosis not present

## 2014-03-18 DIAGNOSIS — G894 Chronic pain syndrome: Secondary | ICD-10-CM | POA: Diagnosis not present

## 2014-03-18 DIAGNOSIS — M1991 Primary osteoarthritis, unspecified site: Secondary | ICD-10-CM | POA: Diagnosis not present

## 2014-03-18 DIAGNOSIS — E063 Autoimmune thyroiditis: Secondary | ICD-10-CM | POA: Diagnosis not present

## 2014-03-22 DIAGNOSIS — M25571 Pain in right ankle and joints of right foot: Secondary | ICD-10-CM | POA: Diagnosis not present

## 2014-03-22 DIAGNOSIS — M6249 Contracture of muscle, multiple sites: Secondary | ICD-10-CM | POA: Diagnosis not present

## 2014-03-22 DIAGNOSIS — G608 Other hereditary and idiopathic neuropathies: Secondary | ICD-10-CM | POA: Diagnosis not present

## 2014-03-22 DIAGNOSIS — R269 Unspecified abnormalities of gait and mobility: Secondary | ICD-10-CM | POA: Diagnosis not present

## 2014-03-22 DIAGNOSIS — M797 Fibromyalgia: Secondary | ICD-10-CM | POA: Diagnosis not present

## 2014-03-22 DIAGNOSIS — M47817 Spondylosis without myelopathy or radiculopathy, lumbosacral region: Secondary | ICD-10-CM | POA: Diagnosis not present

## 2014-03-22 DIAGNOSIS — M25572 Pain in left ankle and joints of left foot: Secondary | ICD-10-CM | POA: Diagnosis not present

## 2014-03-22 DIAGNOSIS — G894 Chronic pain syndrome: Secondary | ICD-10-CM | POA: Diagnosis not present

## 2014-03-22 DIAGNOSIS — M545 Low back pain: Secondary | ICD-10-CM | POA: Diagnosis not present

## 2014-04-12 DIAGNOSIS — Z6841 Body Mass Index (BMI) 40.0 and over, adult: Secondary | ICD-10-CM | POA: Diagnosis not present

## 2014-04-12 DIAGNOSIS — G894 Chronic pain syndrome: Secondary | ICD-10-CM | POA: Diagnosis not present

## 2014-05-10 DIAGNOSIS — G894 Chronic pain syndrome: Secondary | ICD-10-CM | POA: Diagnosis not present

## 2014-05-10 DIAGNOSIS — Z6841 Body Mass Index (BMI) 40.0 and over, adult: Secondary | ICD-10-CM | POA: Diagnosis not present

## 2014-05-10 DIAGNOSIS — M797 Fibromyalgia: Secondary | ICD-10-CM | POA: Diagnosis not present

## 2014-05-20 ENCOUNTER — Other Ambulatory Visit: Payer: Self-pay | Admitting: *Deleted

## 2014-05-20 DIAGNOSIS — Z9884 Bariatric surgery status: Secondary | ICD-10-CM | POA: Diagnosis not present

## 2014-05-20 DIAGNOSIS — R1013 Epigastric pain: Secondary | ICD-10-CM | POA: Diagnosis not present

## 2014-05-20 DIAGNOSIS — R142 Eructation: Secondary | ICD-10-CM

## 2014-05-20 DIAGNOSIS — G8929 Other chronic pain: Secondary | ICD-10-CM

## 2014-05-21 ENCOUNTER — Other Ambulatory Visit (HOSPITAL_COMMUNITY): Payer: Self-pay | Admitting: Physician Assistant

## 2014-05-21 DIAGNOSIS — Z0000 Encounter for general adult medical examination without abnormal findings: Secondary | ICD-10-CM | POA: Diagnosis not present

## 2014-05-21 DIAGNOSIS — Z1231 Encounter for screening mammogram for malignant neoplasm of breast: Secondary | ICD-10-CM

## 2014-05-21 DIAGNOSIS — Z6841 Body Mass Index (BMI) 40.0 and over, adult: Secondary | ICD-10-CM | POA: Diagnosis not present

## 2014-05-21 DIAGNOSIS — E063 Autoimmune thyroiditis: Secondary | ICD-10-CM | POA: Diagnosis not present

## 2014-05-21 DIAGNOSIS — G894 Chronic pain syndrome: Secondary | ICD-10-CM | POA: Diagnosis not present

## 2014-05-24 ENCOUNTER — Ambulatory Visit
Admission: RE | Admit: 2014-05-24 | Discharge: 2014-05-24 | Disposition: A | Discharge status: Home / Self Care | Payer: BLUE CROSS/BLUE SHIELD | Source: Ambulatory Visit | Attending: Physician Assistant | Admitting: Physician Assistant

## 2014-05-24 DIAGNOSIS — R1013 Epigastric pain: Secondary | ICD-10-CM | POA: Diagnosis not present

## 2014-05-24 DIAGNOSIS — K228 Other specified diseases of esophagus: Secondary | ICD-10-CM | POA: Diagnosis not present

## 2014-05-27 DIAGNOSIS — R1013 Epigastric pain: Secondary | ICD-10-CM | POA: Diagnosis not present

## 2014-05-27 DIAGNOSIS — Z6841 Body Mass Index (BMI) 40.0 and over, adult: Secondary | ICD-10-CM | POA: Diagnosis not present

## 2014-06-02 DIAGNOSIS — Z6841 Body Mass Index (BMI) 40.0 and over, adult: Secondary | ICD-10-CM | POA: Diagnosis not present

## 2014-06-02 DIAGNOSIS — M79673 Pain in unspecified foot: Secondary | ICD-10-CM | POA: Diagnosis not present

## 2014-06-07 DIAGNOSIS — Q211 Atrial septal defect: Secondary | ICD-10-CM | POA: Diagnosis not present

## 2014-06-07 DIAGNOSIS — G609 Hereditary and idiopathic neuropathy, unspecified: Secondary | ICD-10-CM | POA: Diagnosis not present

## 2014-06-07 DIAGNOSIS — G4709 Other insomnia: Secondary | ICD-10-CM | POA: Diagnosis not present

## 2014-06-07 DIAGNOSIS — I639 Cerebral infarction, unspecified: Secondary | ICD-10-CM | POA: Diagnosis not present

## 2014-06-08 DIAGNOSIS — Z6841 Body Mass Index (BMI) 40.0 and over, adult: Secondary | ICD-10-CM | POA: Diagnosis not present

## 2014-06-08 DIAGNOSIS — M797 Fibromyalgia: Secondary | ICD-10-CM | POA: Diagnosis not present

## 2014-06-11 ENCOUNTER — Ambulatory Visit (HOSPITAL_COMMUNITY)
Admission: RE | Admit: 2014-06-11 | Discharge: 2014-06-11 | Disposition: A | Discharge status: Home / Self Care | Payer: BLUE CROSS/BLUE SHIELD | Source: Ambulatory Visit | Attending: Physician Assistant | Admitting: Physician Assistant

## 2014-06-11 DIAGNOSIS — Z1231 Encounter for screening mammogram for malignant neoplasm of breast: Secondary | ICD-10-CM

## 2014-06-22 DIAGNOSIS — R202 Paresthesia of skin: Secondary | ICD-10-CM | POA: Diagnosis not present

## 2014-07-02 DIAGNOSIS — G609 Hereditary and idiopathic neuropathy, unspecified: Secondary | ICD-10-CM | POA: Diagnosis not present

## 2014-07-02 DIAGNOSIS — Q211 Atrial septal defect: Secondary | ICD-10-CM | POA: Diagnosis not present

## 2014-07-02 DIAGNOSIS — G4709 Other insomnia: Secondary | ICD-10-CM | POA: Diagnosis not present

## 2014-07-02 DIAGNOSIS — I639 Cerebral infarction, unspecified: Secondary | ICD-10-CM | POA: Diagnosis not present

## 2014-07-06 DIAGNOSIS — Z6841 Body Mass Index (BMI) 40.0 and over, adult: Secondary | ICD-10-CM | POA: Diagnosis not present

## 2014-07-06 DIAGNOSIS — M545 Low back pain: Secondary | ICD-10-CM | POA: Diagnosis not present

## 2014-07-06 DIAGNOSIS — M4126 Other idiopathic scoliosis, lumbar region: Secondary | ICD-10-CM | POA: Diagnosis not present

## 2014-07-08 ENCOUNTER — Other Ambulatory Visit (HOSPITAL_COMMUNITY): Payer: Self-pay | Admitting: Neurosurgery

## 2014-07-08 DIAGNOSIS — M419 Scoliosis, unspecified: Secondary | ICD-10-CM

## 2014-07-20 ENCOUNTER — Ambulatory Visit (HOSPITAL_COMMUNITY)
Admission: RE | Admit: 2014-07-20 | Discharge: 2014-07-20 | Disposition: A | Discharge status: Home / Self Care | Payer: BLUE CROSS/BLUE SHIELD | Source: Ambulatory Visit | Attending: Neurosurgery | Admitting: Neurosurgery

## 2014-07-20 DIAGNOSIS — R531 Weakness: Secondary | ICD-10-CM | POA: Insufficient documentation

## 2014-07-20 DIAGNOSIS — M1288 Other specific arthropathies, not elsewhere classified, other specified site: Secondary | ICD-10-CM | POA: Insufficient documentation

## 2014-07-20 DIAGNOSIS — M5136 Other intervertebral disc degeneration, lumbar region: Secondary | ICD-10-CM | POA: Diagnosis not present

## 2014-07-20 DIAGNOSIS — M419 Scoliosis, unspecified: Secondary | ICD-10-CM

## 2014-07-20 DIAGNOSIS — M5126 Other intervertebral disc displacement, lumbar region: Secondary | ICD-10-CM | POA: Diagnosis not present

## 2014-07-20 DIAGNOSIS — M545 Low back pain: Secondary | ICD-10-CM | POA: Insufficient documentation

## 2014-07-20 DIAGNOSIS — M47816 Spondylosis without myelopathy or radiculopathy, lumbar region: Secondary | ICD-10-CM | POA: Diagnosis not present

## 2014-08-04 DIAGNOSIS — M797 Fibromyalgia: Secondary | ICD-10-CM | POA: Diagnosis not present

## 2014-08-04 DIAGNOSIS — Z6839 Body mass index (BMI) 39.0-39.9, adult: Secondary | ICD-10-CM | POA: Diagnosis not present

## 2014-08-04 DIAGNOSIS — Z1389 Encounter for screening for other disorder: Secondary | ICD-10-CM | POA: Diagnosis not present

## 2014-08-12 DIAGNOSIS — M5137 Other intervertebral disc degeneration, lumbosacral region: Secondary | ICD-10-CM | POA: Diagnosis not present

## 2014-08-12 DIAGNOSIS — G4709 Other insomnia: Secondary | ICD-10-CM | POA: Diagnosis not present

## 2014-08-12 DIAGNOSIS — M797 Fibromyalgia: Secondary | ICD-10-CM | POA: Diagnosis not present

## 2014-08-12 DIAGNOSIS — E559 Vitamin D deficiency, unspecified: Secondary | ICD-10-CM | POA: Diagnosis not present

## 2014-09-03 DIAGNOSIS — M797 Fibromyalgia: Secondary | ICD-10-CM | POA: Diagnosis not present

## 2014-09-03 DIAGNOSIS — Z6838 Body mass index (BMI) 38.0-38.9, adult: Secondary | ICD-10-CM | POA: Diagnosis not present

## 2014-09-03 DIAGNOSIS — Z1389 Encounter for screening for other disorder: Secondary | ICD-10-CM | POA: Diagnosis not present

## 2014-09-29 DIAGNOSIS — F329 Major depressive disorder, single episode, unspecified: Secondary | ICD-10-CM | POA: Diagnosis not present

## 2014-09-29 DIAGNOSIS — R339 Retention of urine, unspecified: Secondary | ICD-10-CM | POA: Diagnosis not present

## 2014-09-29 DIAGNOSIS — Z1389 Encounter for screening for other disorder: Secondary | ICD-10-CM | POA: Diagnosis not present

## 2014-09-29 DIAGNOSIS — G894 Chronic pain syndrome: Secondary | ICD-10-CM | POA: Diagnosis not present

## 2014-10-05 DIAGNOSIS — M4126 Other idiopathic scoliosis, lumbar region: Secondary | ICD-10-CM | POA: Diagnosis not present

## 2014-10-05 DIAGNOSIS — M545 Low back pain: Secondary | ICD-10-CM | POA: Diagnosis not present

## 2014-10-13 DIAGNOSIS — R3911 Hesitancy of micturition: Secondary | ICD-10-CM | POA: Diagnosis not present

## 2014-10-13 DIAGNOSIS — R3916 Straining to void: Secondary | ICD-10-CM | POA: Diagnosis not present

## 2014-10-25 DIAGNOSIS — I639 Cerebral infarction, unspecified: Secondary | ICD-10-CM | POA: Diagnosis not present

## 2014-10-25 DIAGNOSIS — G4709 Other insomnia: Secondary | ICD-10-CM | POA: Diagnosis not present

## 2014-10-25 DIAGNOSIS — G609 Hereditary and idiopathic neuropathy, unspecified: Secondary | ICD-10-CM | POA: Diagnosis not present

## 2014-10-25 DIAGNOSIS — Q211 Atrial septal defect: Secondary | ICD-10-CM | POA: Diagnosis not present

## 2014-10-28 DIAGNOSIS — R3916 Straining to void: Secondary | ICD-10-CM | POA: Diagnosis not present

## 2014-10-28 DIAGNOSIS — R3911 Hesitancy of micturition: Secondary | ICD-10-CM | POA: Diagnosis not present

## 2014-10-29 DIAGNOSIS — Z9884 Bariatric surgery status: Secondary | ICD-10-CM | POA: Diagnosis not present

## 2014-11-08 DIAGNOSIS — N319 Neuromuscular dysfunction of bladder, unspecified: Secondary | ICD-10-CM | POA: Diagnosis not present

## 2014-11-11 DIAGNOSIS — Z6839 Body mass index (BMI) 39.0-39.9, adult: Secondary | ICD-10-CM | POA: Diagnosis not present

## 2014-11-11 DIAGNOSIS — E039 Hypothyroidism, unspecified: Secondary | ICD-10-CM | POA: Diagnosis not present

## 2014-11-11 DIAGNOSIS — Z1389 Encounter for screening for other disorder: Secondary | ICD-10-CM | POA: Diagnosis not present

## 2014-11-11 DIAGNOSIS — R002 Palpitations: Secondary | ICD-10-CM | POA: Diagnosis not present

## 2014-11-25 ENCOUNTER — Encounter: Payer: Self-pay | Admitting: Dietician

## 2014-11-25 ENCOUNTER — Encounter: Payer: BLUE CROSS/BLUE SHIELD | Attending: Surgery | Admitting: Dietician

## 2014-11-25 DIAGNOSIS — Z6841 Body Mass Index (BMI) 40.0 and over, adult: Secondary | ICD-10-CM | POA: Insufficient documentation

## 2014-11-25 DIAGNOSIS — Z713 Dietary counseling and surveillance: Secondary | ICD-10-CM | POA: Diagnosis not present

## 2014-11-25 NOTE — Progress Notes (Signed)
Medical Nutrition Therapy:  Appt start time: 1120 end time:  1220.   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 She reports tendencies to eat a lot of the same foods several days or weeks before moving on to another food. She is a retired Therapist, sports from [employer]. She reports that she does not like meat or cheese much anymore since bariatric surgery. However, she reports eating some of these foods during her dietary recall. Recently tried [employer] and lost some weight but still struggled to meet protein needs. Has dumping syndrome with desserts.   Samples provided and patient provided on proper use: Celebrate multivitamin soft chew (berry - qty 1) Lot#: [id number] Exp: 03/2016  Celebrate calcium citrate chew (chocolate - qty 1) Lot#: [id number] Exp: 03/2016  Celebrate calcium citrate chew (caramel - qty 1) Lot#: [id number] Exp: 03/2016  Celebrate calcium citrate chew (berry - qty 1) Lot#: [id number] Exp: 03/2016   Preferred Learning Style:   No preference indicated   Learning Readiness:   Ready   MEDICATIONS: see list   DIETARY INTAKE:  Avoided foods include dry meats, eggs, broccoli.    24-hr recall:  B ( AM): bowl of honey bunches of oats cereal (1.5 cups of cereal) with unsweetened almond milk, sometimes with an apple or banana OR 2 [place] hotdogs with 2 pieces of cheese Snk ( AM): [facility] "sweet and nutty" bar  L ( PM): chicken salad OR peanut butter and honey on 2 slices whole wheat bread; sweet tea Snk ( PM): sometimes another granola bar D ( PM): pork chops with BBQ sauce or Ranch, green beans, and corn Snk ( PM): another 1/2 sandwich and/or 2 fudgesicles   Beverages: decaf sweet tea (less than 1 cup sugar per gallon), water  Usual physical activity: did not assess today  Estimated energy needs: 1200-1400  calories  Progress Towards Goal(s):  In progress.   Nutritional Diagnosis:  [place]-3.4 Unintentional weight gain As related to excess carbohydrate and overall calorie intake.  As evidenced by patient report of weight regain 4 years post op RYGB.    Intervention:  Nutrition counseling provided. Goals: 1. Celebrate Calcium citrate soft chew (3x a day at least 2 hours apart) 2. Celebrate multivitamin soft chew (at least 2 hours apart from Calcium)  Minerva Areola these at the [facility] can also try Bariatric Advantage products (get these online)  3. Find a "sublingual" vitamin B12 (300-500 mcg per day OR 1,000-1,200 mcg 2x a week)  -Work on increasing soft protein foods (try to have a protein food with most meals and snacks)  -Chicken/egg/tuna salad, salmon, cheese, hot dogs, chili beans and hamburger meat, sliced ham and [place], [place] yogurt -Limit 2 yogurts per day -Limit portions of nuts and peanut butter -Try using 1 slice of bread instead of 2 for sandwiches (try using peanut butter on celery or apples) -Start measuring out cereal -Start practicing drinking more water and less tea -Aim to avoid fluids 15 minutes before, during, and 30 minutes after meals -Be mindful of chewing thoroughly, eating slowly, and taking tiny bites -Try eating meat first, then veggies, then starch "Free foods:" sugar free popsicles, sugar free jello, non starchy vegetables, low sodium pickle (limit to 2)  Teaching Method Utilized:  Visual Auditory Hands on  Handouts given during visit include:  Phase 3A lean proteins  Phase 3B lean protein + non starchy vegetables  Bariatric snack ideas  Barriers to learning/adherence to lifestyle change: balance issues due to stroke  Demonstrated degree of understanding via:  Teach Back   Monitoring/Evaluation:  Dietary intake and body weight in 4 week(s).

## 2014-11-25 NOTE — Patient Instructions (Addendum)
1. Celebrate Calcium citrate soft chew (3x a day at least 2 hours apart) 2. Celebrate multivitamin soft chew (at least 2 hours apart from Calcium)  Debbie Pineda these at the [facility] can also try Bariatric Advantage products (get these online)  3. Find a "sublingual" vitamin B12 (300-500 mcg per day OR 1,000-1,200 mcg 2x a week)   -Work on increasing soft protein foods (try to have a protein food with most meals and snacks)  -Chicken/egg/tuna salad, salmon, cheese, hot dogs, chili beans and hamburger meat, sliced ham and [place], [place] yogurt  -Limit 2 yogurts per day  -Limit portions of nuts and peanut butter -Try using 1 slice of bread instead of 2 for sandwiches (try using peanut butter on celery or apples) -Start measuring out cereal -Start practicing drinking more water and less tea -Aim to avoid fluids 15 minutes before, during, and 30 minutes after meals -Be mindful of chewing thoroughly, eating slowly, and taking tiny bites -Try eating meat first, then veggies, then starch  "Free foods:" sugar free popsicles, sugar free jello, non starchy vegetables, low sodium pickle (limit to 2)

## 2014-12-27 DIAGNOSIS — M170 Bilateral primary osteoarthritis of knee: Secondary | ICD-10-CM | POA: Diagnosis not present

## 2014-12-27 DIAGNOSIS — Z6841 Body Mass Index (BMI) 40.0 and over, adult: Secondary | ICD-10-CM | POA: Diagnosis not present

## 2014-12-27 DIAGNOSIS — Z1389 Encounter for screening for other disorder: Secondary | ICD-10-CM | POA: Diagnosis not present

## 2014-12-27 DIAGNOSIS — Z23 Encounter for immunization: Secondary | ICD-10-CM | POA: Diagnosis not present

## 2014-12-27 DIAGNOSIS — E063 Autoimmune thyroiditis: Secondary | ICD-10-CM | POA: Diagnosis not present

## 2014-12-27 DIAGNOSIS — R609 Edema, unspecified: Secondary | ICD-10-CM | POA: Diagnosis not present

## 2014-12-27 DIAGNOSIS — M797 Fibromyalgia: Secondary | ICD-10-CM | POA: Diagnosis not present

## 2014-12-28 ENCOUNTER — Ambulatory Visit: Payer: BLUE CROSS/BLUE SHIELD | Admitting: Dietician

## 2015-01-04 DIAGNOSIS — R600 Localized edema: Secondary | ICD-10-CM | POA: Diagnosis not present

## 2015-01-04 DIAGNOSIS — Z6841 Body Mass Index (BMI) 40.0 and over, adult: Secondary | ICD-10-CM | POA: Diagnosis not present

## 2015-01-18 ENCOUNTER — Ambulatory Visit: Payer: BLUE CROSS/BLUE SHIELD | Admitting: Dietician

## 2015-01-25 DIAGNOSIS — M19071 Primary osteoarthritis, right ankle and foot: Secondary | ICD-10-CM | POA: Diagnosis not present

## 2015-01-25 DIAGNOSIS — M19072 Primary osteoarthritis, left ankle and foot: Secondary | ICD-10-CM | POA: Diagnosis not present

## 2015-01-25 DIAGNOSIS — M1712 Unilateral primary osteoarthritis, left knee: Secondary | ICD-10-CM | POA: Diagnosis not present

## 2015-01-26 DIAGNOSIS — N941 Unspecified dyspareunia: Secondary | ICD-10-CM | POA: Diagnosis not present

## 2015-01-26 DIAGNOSIS — R102 Pelvic and perineal pain: Secondary | ICD-10-CM | POA: Diagnosis not present

## 2015-01-26 DIAGNOSIS — R293 Abnormal posture: Secondary | ICD-10-CM | POA: Diagnosis not present

## 2015-01-26 DIAGNOSIS — M6281 Muscle weakness (generalized): Secondary | ICD-10-CM | POA: Diagnosis not present

## 2015-01-26 DIAGNOSIS — R278 Other lack of coordination: Secondary | ICD-10-CM | POA: Diagnosis not present

## 2015-02-02 DIAGNOSIS — M797 Fibromyalgia: Secondary | ICD-10-CM | POA: Diagnosis not present

## 2015-02-02 DIAGNOSIS — M5137 Other intervertebral disc degeneration, lumbosacral region: Secondary | ICD-10-CM | POA: Diagnosis not present

## 2015-02-02 DIAGNOSIS — M170 Bilateral primary osteoarthritis of knee: Secondary | ICD-10-CM | POA: Diagnosis not present

## 2015-02-02 DIAGNOSIS — R5383 Other fatigue: Secondary | ICD-10-CM | POA: Diagnosis not present

## 2015-02-04 DIAGNOSIS — R278 Other lack of coordination: Secondary | ICD-10-CM | POA: Diagnosis not present

## 2015-02-04 DIAGNOSIS — M62838 Other muscle spasm: Secondary | ICD-10-CM | POA: Diagnosis not present

## 2015-02-04 DIAGNOSIS — M6281 Muscle weakness (generalized): Secondary | ICD-10-CM | POA: Diagnosis not present

## 2015-02-04 DIAGNOSIS — R102 Pelvic and perineal pain: Secondary | ICD-10-CM | POA: Diagnosis not present

## 2015-02-11 DIAGNOSIS — M6281 Muscle weakness (generalized): Secondary | ICD-10-CM | POA: Diagnosis not present

## 2015-02-11 DIAGNOSIS — R39198 Other difficulties with micturition: Secondary | ICD-10-CM | POA: Diagnosis not present

## 2015-02-11 DIAGNOSIS — N941 Unspecified dyspareunia: Secondary | ICD-10-CM | POA: Diagnosis not present

## 2015-02-11 DIAGNOSIS — M62838 Other muscle spasm: Secondary | ICD-10-CM | POA: Diagnosis not present

## 2015-02-11 DIAGNOSIS — R278 Other lack of coordination: Secondary | ICD-10-CM | POA: Diagnosis not present

## 2015-02-17 ENCOUNTER — Ambulatory Visit: Payer: BLUE CROSS/BLUE SHIELD | Admitting: Dietician

## 2015-02-23 DIAGNOSIS — M19072 Primary osteoarthritis, left ankle and foot: Secondary | ICD-10-CM | POA: Diagnosis not present

## 2015-02-24 DIAGNOSIS — R3911 Hesitancy of micturition: Secondary | ICD-10-CM | POA: Diagnosis not present

## 2015-03-02 DIAGNOSIS — R600 Localized edema: Secondary | ICD-10-CM | POA: Diagnosis not present

## 2015-03-02 DIAGNOSIS — Z6841 Body Mass Index (BMI) 40.0 and over, adult: Secondary | ICD-10-CM | POA: Diagnosis not present

## 2015-03-04 DIAGNOSIS — Z1389 Encounter for screening for other disorder: Secondary | ICD-10-CM | POA: Diagnosis not present

## 2015-03-04 DIAGNOSIS — R110 Nausea: Secondary | ICD-10-CM | POA: Diagnosis not present

## 2015-03-04 DIAGNOSIS — Z6838 Body mass index (BMI) 38.0-38.9, adult: Secondary | ICD-10-CM | POA: Diagnosis not present

## 2015-03-07 ENCOUNTER — Telehealth (INDEPENDENT_AMBULATORY_CARE_PROVIDER_SITE_OTHER): Payer: Self-pay | Admitting: Internal Medicine

## 2015-03-07 NOTE — Telephone Encounter (Signed)
Advised to get an enema. She will call tomorrow with a PR report.

## 2015-03-07 NOTE — Telephone Encounter (Signed)
Debbie Pineda called saying she's been nauseous for two weeks. She also hasn't had a bowel movement in 5-6 days although she takes at least three laxatives per day. She's currently scheduled with Terri on 2.27.17 at 1:45pm, but she'd like to be worked in sooner than that if possible. Please call the patient if needed.  Pt's ph# [phone] Thank you.

## 2015-03-10 ENCOUNTER — Other Ambulatory Visit: Payer: Self-pay | Admitting: Orthopedic Surgery

## 2015-03-10 DIAGNOSIS — Z9884 Bariatric surgery status: Secondary | ICD-10-CM | POA: Diagnosis not present

## 2015-03-10 DIAGNOSIS — K838 Other specified diseases of biliary tract: Secondary | ICD-10-CM | POA: Diagnosis not present

## 2015-03-10 DIAGNOSIS — Z9049 Acquired absence of other specified parts of digestive tract: Secondary | ICD-10-CM | POA: Diagnosis not present

## 2015-03-14 DIAGNOSIS — Z8673 Personal history of transient ischemic attack (TIA), and cerebral infarction without residual deficits: Secondary | ICD-10-CM | POA: Diagnosis not present

## 2015-03-14 DIAGNOSIS — Q211 Atrial septal defect: Secondary | ICD-10-CM | POA: Diagnosis not present

## 2015-03-14 DIAGNOSIS — Z86718 Personal history of other venous thrombosis and embolism: Secondary | ICD-10-CM | POA: Diagnosis not present

## 2015-03-14 DIAGNOSIS — Z7901 Long term (current) use of anticoagulants: Secondary | ICD-10-CM | POA: Diagnosis not present

## 2015-03-14 NOTE — Pre-Procedure Instructions (Signed)
Yarnell Daugherty Vavra  03/14/2015      [id number] [address], [place] [id number] [place] [id number] Phone: [phone] Fax: [phone]    Your procedure is scheduled on Wed, Mar 1 @ 9:05 AM  Report to [facility] Admitting at 7:00 AM  Call this number if you have problems the morning of surgery:  [phone]   Remember:  Do not eat food or drink liquids after midnight.  Take these medicines the morning of surgery with A SIP OF WATER Amphetamine(Adderall),Cymbalta(Duloxetine),Pain Pill(if needed),Synthroid(Levothyroxine),and Ondansetron(Zofran-if needed)               Stop taking your Krill Oil along with any Vitamins or Herbal Medications. No Goody's,BC's,Aleve,Aspirin,Ibuprofen,Motrin,Advil,or Fish Oil.   Do not wear jewelry, make-up or nail polish.  Do not wear lotions, powders, or perfumes.  You may wear deodorant.  Do not shave 48 hours prior to surgery.    Do not bring valuables to the hospital.  [facility] is not responsible for any belongings or valuables.  Contacts, dentures or bridgework may not be worn into surgery.  Leave your suitcase in the car.  After surgery it may be brought to your room.  For patients admitted to the hospital, discharge time will be determined by your treatment team.  Patients discharged the day of surgery will not be allowed to drive home.    Special instructions:  [facility] - Preparing for Surgery  Before surgery, you can play an important role.  Because skin is not sterile, your skin needs to be as free of germs as possible.  You can reduce the number of germs on you skin by washing with CHG (chlorahexidine gluconate) soap before surgery.  CHG is an antiseptic cleaner which kills germs and bonds with the skin to continue killing germs even after washing.  Please DO NOT use if you have an allergy to CHG or antibacterial soaps.  If your skin becomes reddened/irritated stop using  the CHG and inform your nurse when you arrive at Short Stay.  Do not shave (including legs and underarms) for at least 48 hours prior to the first CHG shower.  You may shave your face.  Please follow these instructions carefully:   1.  Shower with CHG Soap the night before surgery and the                                morning of Surgery.  2.  If you choose to wash your hair, wash your hair first as usual with your       normal shampoo.  3.  After you shampoo, rinse your hair and body thoroughly to remove the                      Shampoo.  4.  Use CHG as you would any other liquid soap.  You can apply chg directly       to the skin and wash gently with scrungie or a clean washcloth.  5.  Apply the CHG Soap to your body ONLY FROM THE NECK DOWN.        Do not use on open wounds or open sores.  Avoid contact with your eyes,       ears, mouth and genitals (private parts).  Wash genitals (private parts)       with your normal  soap.  6.  Wash thoroughly, paying special attention to the area where your surgery        will be performed.  7.  Thoroughly rinse your body with warm water from the neck down.  8.  DO NOT shower/wash with your normal soap after using and rinsing off       the CHG Soap.  9.  Pat yourself dry with a clean towel.            10.  Wear clean pajamas.            11.  Place clean sheets on your bed the night of your first shower and do not        sleep with pets.  Day of Surgery  Do not apply any lotions/deoderants the morning of surgery.  Please wear clean clothes to the hospital/surgery center.    Please read over the following fact sheets that you were given. Pain Booklet, Coughing and Deep Breathing, MRSA Information and Surgical Site Infection Prevention

## 2015-03-15 ENCOUNTER — Encounter (HOSPITAL_COMMUNITY): Payer: Self-pay

## 2015-03-15 ENCOUNTER — Ambulatory Visit (INDEPENDENT_AMBULATORY_CARE_PROVIDER_SITE_OTHER): Payer: BLUE CROSS/BLUE SHIELD | Admitting: Internal Medicine

## 2015-03-15 ENCOUNTER — Encounter (HOSPITAL_COMMUNITY)
Admission: RE | Admit: 2015-03-15 | Discharge: 2015-03-15 | Disposition: A | Discharge status: Home / Self Care | Payer: BLUE CROSS/BLUE SHIELD | Source: Ambulatory Visit | Attending: Orthopedic Surgery | Admitting: Orthopedic Surgery

## 2015-03-15 DIAGNOSIS — Z79899 Other long term (current) drug therapy: Secondary | ICD-10-CM | POA: Diagnosis not present

## 2015-03-15 DIAGNOSIS — M1712 Unilateral primary osteoarthritis, left knee: Secondary | ICD-10-CM | POA: Insufficient documentation

## 2015-03-15 DIAGNOSIS — Z87891 Personal history of nicotine dependence: Secondary | ICD-10-CM | POA: Diagnosis not present

## 2015-03-15 DIAGNOSIS — Z9884 Bariatric surgery status: Secondary | ICD-10-CM | POA: Insufficient documentation

## 2015-03-15 DIAGNOSIS — Z86718 Personal history of other venous thrombosis and embolism: Secondary | ICD-10-CM | POA: Diagnosis not present

## 2015-03-15 DIAGNOSIS — Z01812 Encounter for preprocedural laboratory examination: Secondary | ICD-10-CM | POA: Diagnosis not present

## 2015-03-15 DIAGNOSIS — G4700 Insomnia, unspecified: Secondary | ICD-10-CM | POA: Insufficient documentation

## 2015-03-15 DIAGNOSIS — Q211 Atrial septal defect: Secondary | ICD-10-CM | POA: Insufficient documentation

## 2015-03-15 DIAGNOSIS — Z8673 Personal history of transient ischemic attack (TIA), and cerebral infarction without residual deficits: Secondary | ICD-10-CM | POA: Insufficient documentation

## 2015-03-15 DIAGNOSIS — K219 Gastro-esophageal reflux disease without esophagitis: Secondary | ICD-10-CM | POA: Diagnosis not present

## 2015-03-15 DIAGNOSIS — Z01818 Encounter for other preprocedural examination: Secondary | ICD-10-CM | POA: Diagnosis not present

## 2015-03-15 DIAGNOSIS — M797 Fibromyalgia: Secondary | ICD-10-CM | POA: Insufficient documentation

## 2015-03-15 DIAGNOSIS — Z7902 Long term (current) use of antithrombotics/antiplatelets: Secondary | ICD-10-CM | POA: Insufficient documentation

## 2015-03-15 DIAGNOSIS — E039 Hypothyroidism, unspecified: Secondary | ICD-10-CM | POA: Insufficient documentation

## 2015-03-15 HISTORY — DX: Scoliosis, unspecified: M41.9

## 2015-03-15 HISTORY — DX: Family history of other specified conditions: Z84.89

## 2015-03-15 HISTORY — DX: Gastro-esophageal reflux disease without esophagitis: K21.9

## 2015-03-15 HISTORY — DX: Other chronic pain: G89.29

## 2015-03-15 HISTORY — DX: Headache, unspecified: R51.9

## 2015-03-15 HISTORY — DX: Unspecified osteoarthritis, unspecified site: M19.90

## 2015-03-15 HISTORY — DX: Nausea: R11.0

## 2015-03-15 HISTORY — DX: Retention of urine, unspecified: R33.9

## 2015-03-15 HISTORY — DX: Dorsalgia, unspecified: M54.9

## 2015-03-15 HISTORY — DX: Insomnia, unspecified: G47.00

## 2015-03-15 HISTORY — DX: Pain in unspecified joint: M25.50

## 2015-03-15 HISTORY — DX: Other specified behavioral and emotional disorders with onset usually occurring in childhood and adolescence: F98.8

## 2015-03-15 HISTORY — DX: Constipation, unspecified: K59.00

## 2015-03-15 HISTORY — DX: Effusion, unspecified joint: M25.40

## 2015-03-15 HISTORY — DX: Personal history of other venous thrombosis and embolism: Z86.718

## 2015-03-15 LAB — CBC
HCT: 39 % (ref 36.0–46.0)
Hemoglobin: 12.6 g/dL (ref 12.0–15.0)
MCH: 30.8 pg (ref 26.0–34.0)
MCHC: 32.3 g/dL (ref 30.0–36.0)
MCV: 95.4 fL (ref 78.0–100.0)
Platelets: 192 10*3/uL (ref 150–400)
RBC: 4.09 MIL/uL (ref 3.87–5.11)
RDW: 12.6 % (ref 11.5–15.5)
WBC: 4.6 10*3/uL (ref 4.0–10.5)

## 2015-03-15 LAB — PROTIME-INR
INR: 1.09 (ref 0.00–1.49)
Prothrombin Time: 14.3 seconds (ref 11.6–15.2)

## 2015-03-15 LAB — COMPREHENSIVE METABOLIC PANEL
ALT: 75 U/L — ABNORMAL HIGH (ref 14–54)
AST: 96 U/L — ABNORMAL HIGH (ref 15–41)
Albumin: 3.5 g/dL (ref 3.5–5.0)
Alkaline Phosphatase: 101 U/L (ref 38–126)
Anion gap: 7 (ref 5–15)
BUN: 14 mg/dL (ref 6–20)
CO2: 26 mmol/L (ref 22–32)
Calcium: 9.2 mg/dL (ref 8.9–10.3)
Chloride: 110 mmol/L (ref 101–111)
Creatinine, Ser: 1 mg/dL (ref 0.44–1.00)
GFR calc Af Amer: >60 mL/min (ref >60)
GFR calc non Af Amer: >60 mL/min (ref >60)
Glucose, Bld: 93 mg/dL (ref 65–99)
Potassium: 4.5 mmol/L (ref 3.5–5.1)
Sodium: 143 mmol/L (ref 135–145)
Total Bilirubin: 0.5 mg/dL (ref 0.3–1.2)
Total Protein: 6 g/dL — ABNORMAL LOW (ref 6.5–8.1)

## 2015-03-15 LAB — SURGICAL PCR SCREEN
MRSA, PCR: NEGATIVE
Staphylococcus aureus: POSITIVE — AB

## 2015-03-15 LAB — APTT: aPTT: 32 seconds (ref 24–37)

## 2015-03-15 NOTE — Progress Notes (Addendum)
Dr.Todd Sheila Oats is cardiologist-office visit from 03-14-15 in care everywhere  Sees PA Naknek   Echo report in epic from 2013  Stress test report in epic from 2012  Heart cath report in epic from 2007  Sleep study report in epic from 2006  EKG to request from [facility] to request from [facility]

## 2015-03-16 NOTE — Progress Notes (Signed)
Mupirocin script called into the [facility] in [place]

## 2015-03-16 NOTE — Progress Notes (Addendum)
Anesthesia Chart Review: Patient is a 55 year old female scheduled for left TKA on 03/23/15 by Dr. Sharol Given.  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 Reported one of her parents "woke up during surgery." BMI is consistent with obesity. Patient is a retired [employer] scientist.  PCP is Dr. Sharilyn Sites with [facility].  GI is Dr. Laural Golden. Has pending visit with Deberah Castle, NP on 03/07/15 for nausea/constipation.  Neurologist is with [facility]). Cardiologist is Dr. Shade Flood with [place] (Care Everywhere), last visit 03/14/15. According to his notes, previous CVA work-up in 2009 showed "no carotid/vertebral artery atherosclerosis, no atrial fibrillation, and no hypercoagulable state by laboratory evaluation." 10/2011 CVA was in the setting of subtherapeutic INR with history of RLE GSV thrombus. He writes, "She was also seen in consultation by our hematology coagulation group who felt that lifelong anticoaguation and not PFO closure was the best management strategy." He is aware of upcoming TKA plans. He did not make any changes in her medication regimen but suggested she discuss with Dr. Sharol Given the strategy for perioperative anticoagulation given her history of prior DVT and chronic anticoagulation with Xarelto. One year follow-up recommended.   Meds include Adderall XR, Prevagen "memory supplement", Cymbalta, Duragesic patch, 45 Fe, Lasix, Neurontin, Krill oil, levothyroxine, Robaxin, Movantik, oxycodone, Vayacog, pseudoephedrine, Zantac, Topamax, trazodone, Xarelto (on hold starting 03/12/15 per Dr. Sharol Given). In addition to RN medication  instructions, we discussed holding pseudoephedrine, krill oil, Vaycog until after surgery and holding Adderall on the morning of surgery.    11/11/14 EKG (PCP): NSR, cannot rule out inferior infarct. Q wave in lead three and small Q wave in low voltage aVF are not noted on her 10/30/11 tracing but are noted on her 07/13/10 tracing in Epic.  12/06/11 TEE (Care Everywhere):  CONCLUSIONS ------------------------------------------------------------------ 1. Bi directional PFO present, with interatrial aneurysm 2. Normal LVEF 3. No LA/LAA thrombus OR RA/RAA THROMBUS 4. Trivial MR, TR  12/01/10 Stress echo (Care Everywhere): INTERPRETATION --------------------------------------------------------------- Interpretation: Normal Stress Echocardiogram. Note: Trivial TR, PR Normal diastolic function Poor sound transmission; Unable to use imaging agent due to relative contraindication with known PFO No obvious wall motion abnormalities seen despite poor sound transmission  11/30/05 RHC/LHC (Dr. Jenkins [place]): IMPRESSION: The patient has no evidence of pulmonary hypertension. Herfilling pressures were normal. There is no significant coronary arterydisease (LM, LAD, CX, RCA "normal"). She has normal LV function.  10/31/11 Carotid U/S: IMPRESSION: Minor carotid intimal thickening. No significant ICA stenosis by ultrasound. Vertebral artery flow was antegrade.  Preoperative labs noted. AST 96, ALT 75 (newly elevated since 01/30/13). CBC WNL. PT/PTT WNL. Glucose 93. Denied ETOH. Reported elevated LFTs a few years ago while on Norco, but resolved after stopping acetaminophen. She believes she had viral gastroenteritis a few weeks ago with predominant symptom of nausea which lasted over two weeks. She was also unable to have a BM for over 5 days until after using an enema. She still has intermittent nausea and abdominal pain but overall getting better. She is s/p cholecystectomy many years ago.   I left a  voice message and routed CMET results to Dr. Laural Golden and Vaughan Basta, NP asking if labs could be repeated and addressed prior to upcoming surgery. I also updated Malachy Mood  at Dr. Jess Barters office regarding elevated LFTS and upcoming GI appoint and to review cardiology recommendations as outlined in Dr. Thompson Caul note. Chart reviewed with Dr. Marcie Bal. Will follow-up once patient has seen GI.  George Hugh [facility] Short Stay Center/Anesthesiology Phone [phone] 03/16/2015 5:58 PM  Addendum: Patient was seen by Deberah Castle, NP on 03/21/15 for nausea and elevated LFT follow-up. Nausea still present some, but overall improved. LFTs repeated and were WNL. EGD will be planned in the future, but not until after surgery. Cheryl at Dr. Jess Barters office updated. If no acute changes then I would anticipate that patient could proceed as planned.  George Hugh [facility] Short Stay Center/Anesthesiology Phone [phone] 03/22/2015 9:34 AM

## 2015-03-21 ENCOUNTER — Encounter (INDEPENDENT_AMBULATORY_CARE_PROVIDER_SITE_OTHER): Payer: Self-pay | Admitting: Internal Medicine

## 2015-03-21 ENCOUNTER — Ambulatory Visit (INDEPENDENT_AMBULATORY_CARE_PROVIDER_SITE_OTHER): Payer: BLUE CROSS/BLUE SHIELD | Admitting: Internal Medicine

## 2015-03-21 ENCOUNTER — Encounter (INDEPENDENT_AMBULATORY_CARE_PROVIDER_SITE_OTHER): Payer: Self-pay

## 2015-03-21 VITALS — BP 104/62 | HR 100 | Temp 98.2°F | Ht 65.0 in | Wt 233.2 lb

## 2015-03-21 DIAGNOSIS — R748 Abnormal levels of other serum enzymes: Secondary | ICD-10-CM

## 2015-03-21 DIAGNOSIS — K219 Gastro-esophageal reflux disease without esophagitis: Secondary | ICD-10-CM | POA: Diagnosis not present

## 2015-03-21 MED ORDER — OMEPRAZOLE 40 MG PO CPDR: 40.0000 mg | DELAYED_RELEASE_CAPSULE | Freq: Every day | ORAL | Status: DC

## 2015-03-21 NOTE — Progress Notes (Signed)
Subjective:    Patient ID: Debbie Pineda, female    DOB: 1960-10-07, 55 y.o.   MRN: [id number]  HPI Here today for f/u of hx of elevated transaminases. Recent AST and ALT show slight increase. She is scheduled to have a total Knee replacement in the near future 03/23/2015. No hx of tattoos, IV drug abuse or prior blood transfusion. No hx of jaundice, and family hx os negative for chronic liver disease.  She received Hepatitis B vaccination when she worked at [employer]. Her HB antibody titer was high in 2014 which means she is immune.   had responded. She also c/o prolonged nausea and constipation.  She tells me her nausea her better now but she still has some.  She has nausea when she takes her medication or when she eats.  She has not tried the Movantik yet She is having a BM every 2-3 days. She use to have a BM daily. Her appetite is not good. She has lost about 20 pounds in the past 2-3 months which was not intentional. She did however want to lose weight. She does have some epigastric pain and lower abdominal pain with her BMs.  She tells me this morning her BM was very long.  She denies any acid reflux.  Has been taking Diclofenac BID and has been on hold since last week.   03/10/2015 US abdomen: complete: nausea, morbid obesity.  Cholecystectomy. Mild dilatation of the CBD. No AAA. 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 Liver shows normal echotexture.   3/42014 US abdomen: elevated liver enzymes Liver: Minimally echogenic question fatty infiltration. No focal mass or nodularity. Hepatopetal portal venous flow. Ultrasound shows fatty liver and CBD measures 10 mm which is normal post cholecystectomy  03/17/2012 HBs antibody titer is very high which means she is immune. HCV antibody is negative.       04/06/2005: Esphagogastroduodenoscopy.  INDICATIONS: Debbie Pineda is a 55 year old Caucasian female with several-month history of epigastric pain who has had partial response  with double-dose PPI. She has been treated for H pylori gastritis in the past. She has had negative ultrasound and hepatobiliary scan. She had recent abdominal pelvic CT which was unremarkable. She is undergoing diagnostic EGD. Procedure and risks were reviewed with the patient, and informed consent was obtained. FINAL DIAGNOSIS: Mild changes of reflux esophagitis limited to GE junction.  Nonerosive antral gastritis and bulbar duodenitis.  I doubt that gastroduodenitis would explain her pain. Suspect this is secondary to NSAID therapy.  Note that she is status post treatment for H pylori gastritis in the past.        CMP Latest Ref Rng 03/15/2015 01/30/2013 06/27/2012  Glucose 65 - 99 mg/dL 93 90 -  BUN 6 - 20 mg/dL 14 22 -  Creatinine 0.44 - 1.00 mg/dL 1.00 0.89 -  Sodium 135 - 145 mmol/L 143 134(L) -  Potassium 3.5 - 5.1 mmol/L 4.5 4.7 -  Chloride 101 - 111 mmol/L 110 102 -  CO2 22 - 32 mmol/L 26 24 -  Calcium 8.9 - 10.3 mg/dL 9.2 8.9 -  Total Protein 6.5 - 8.1 g/dL 6.0(L) 6.4 6.4  Total Bilirubin 0.3 - 1.2 mg/dL 0.5 0.3 0.3  Alkaline Phos 38 - 126 U/L 101 100 93  AST 15 - 41 U/L 96(H) 17 19  ALT 14 - 54 U/L 75(H) 26 23   02/08/2012 AST 20, ALT 51 01/09/2012 AST 195, ALT 493.   CBC    Component Value Date/Time   WBC 4.6 03/15/2015  1012   RBC 4.09 03/15/2015 1012   HGB 12.6 03/15/2015 1012   HCT 39.0 03/15/2015 1012   PLT 192 03/15/2015 1012   MCV 95.4 03/15/2015 1012   MCH 30.8 03/15/2015 1012   MCHC 32.3 03/15/2015 1012   RDW 12.6 03/15/2015 1012   LYMPHSABS 2.3 01/30/2013 1417   MONOABS 0.4 01/30/2013 1417   EOSABS 0.2 01/30/2013 1417   BASOSABS 0.0 01/30/2013 1417       Review of Systems Past Medical History  Diagnosis Date  . Degenerative disk disease   . Degenerative joint disease   . Hyperglycemia   . Morbid obesity ([place])   . Thrombophlebitis   . Fibromyalgia   . PFO (patent foramen ovale)   . ADD (attention deficit disorder)      takes Adderall daily  . Anemia     takes Ferrous Sulfate daily  . Hypothyroid     takes SYnthroid daily  . GERD (gastroesophageal reflux disease)     takes Zantac daily  . Insomnia     takes Trazodone nightly  . Scoliosis   . Family history of adverse reaction to anesthesia     a family member woke up during surgery but not sure if mom or dad  . History of blood clots     superficial  . Headache     occasionally  . Dizziness     occasionally  . Stroke [facility])     right sided weakness  . Joint pain   . Joint swelling     knees and ankles  . Chronic back pain   . Osteoarthritis   . Constipation     takes stool softener daily  . Nausea     takes Zofran as needed.Seeing GI doc  . Incomplete emptying of bladder   . Depression     takes Cymbalta daily for pain per pt    Past Surgical History  Procedure Laterality Date  . Cholecystectomy    . Knee surgery Bilateral     Left knee with pinning  . Sclerosing of vein Right     x 2   . 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 Exploratory laparotomy    . Right knee surgery Right     Knee arthroscopy  . Colonoscopy N/A 03/25/2013    Procedure: COLONOSCOPY;  Surgeon: Rogene Houston, MD;  Location: [facility] ENDO SUITE;  Service: Endoscopy;  Laterality: N/A;  930  . Vaginal hysterectomy    . Esophagogastroduodenoscopy    . Knee pinning       rt  knee     Allergies  Allergen Reactions  . Flexeril [Cyclobenzaprine Hcl] Other (See Comments)    Numbness of extremities  . Morphine And Related Itching    Upper torso  . Septra [Bactrim] Itching and Rash    Upper torso  . Sulfa Antibiotics Itching and Rash    Itching was upper torso  . Sulfamethoxazole-Trimethoprim Itching and Rash    Bactrim  . Tape Itching and Rash    Please use "paper" tape    Current Outpatient Prescriptions on File Prior to Visit  Medication Sig Dispense Refill  . amphetamine-dextroamphetamine (ADDERALL XR) 30 MG 24 hr capsule Take 30 mg by mouth daily.   0  .  amphetamine-dextroamphetamine (ADDERALL) 30 MG tablet Take 15 mg by mouth daily as needed (for tiredness).   0  . DULoxetine (CYMBALTA) 60 MG capsule Take 60 mg by mouth 2 (two) times daily.  11  .  fentaNYL (DURAGESIC - DOSED MCG/HR) 75 MCG/HR Place 75 mcg onto the skin every 3 (three) days. Reported on 03/21/2015    . Ferrous Sulfate 140 (45 Fe) MG TBCR Take 45 mg by mouth daily.     Marland Kitchen gabapentin (NEURONTIN) 300 MG capsule Take 300 mg by mouth 4 (four) times daily.     Marland Kitchen levothyroxine (SYNTHROID, LEVOTHROID) 100 MCG tablet 175 mcg.     . methocarbamol (ROBAXIN) 750 MG tablet Take 750 mg by mouth 3 (three) times daily.    . ondansetron (ZOFRAN-ODT) 4 MG disintegrating tablet Take 4 mg by mouth every 6 (six) hours as needed for nausea.   2  . Oxycodone HCl 10 MG TABS TK 1 T PO Q 4-6 H PRN  0  . simethicone (MYLICON) 80 MG chewable tablet Chew 80 mg by mouth as needed. Reported on 03/21/2015    . topiramate (TOPAMAX) 100 MG tablet Take 150 mg by mouth at bedtime.     . traZODone (DESYREL) 50 MG tablet Take 100-300 mg by mouth at bedtime as needed for sleep.     Marland Kitchen Apoaequorin (PREVAGEN) 10 MG CAPS Take 30 mg by mouth daily. Reported on 03/21/2015    . Calcium Carbonate-Vitamin D3 (CALCIUM 600-D) 600-400 MG-UNIT TABS Take 1 tablet by mouth 2 (two) times daily. Reported on 03/21/2015    . Cyanocobalamin ([employer] B-12) 1500 MCG TBDP Place 3,000 mg under the tongue 2 (two) times a week. Reported on 03/21/2015    . diclofenac sodium (VOLTAREN) 1 % GEL Apply topically 4 (four) times daily. Reported on 03/21/2015    . Diclofenac-Misoprostol 50-0.2 MG TBEC Take 1 tablet by mouth 2 (two) times daily. Reported on 03/21/2015  1  . docusate sodium (COLACE) 100 MG capsule Take 200 mg by mouth 2 (two) times daily. Reported on 03/21/2015    . furosemide (LASIX) 20 MG tablet Take 20 mg by mouth as needed. Reported on 03/21/2015    . glucosamine-chondroitin (GLUCOSAMINE-CHONDROITIN DS) 500-400 MG tablet Take 1 tablet by mouth 2  (two) times daily. Reported on 03/21/2015    . ibuprofen (ADVIL,MOTRIN) 200 MG tablet Take 600 mg by mouth 2 (two) times daily as needed for headache, mild pain or moderate pain. Reported on 03/21/2015    . ibuprofen (ADVIL,MOTRIN) 200 MG tablet Take 600 mg by mouth daily as needed. Reported on 03/21/2015    . ketoconazole (NIZORAL) 2 % cream Apply 1 application topically 2 (two) times daily. Reported on 03/21/2015    . Krill Oil 300 MG CAPS Take 300 mg by mouth at bedtime. Reported on 03/21/2015    . MOVANTIK 25 MG TABS tablet Take 25 mg by mouth daily as needed (for constipation). Reported on 03/21/2015    . Multiple Vitamins-Iron (MULTIVITAMIN/IRON PO) Take 1 tablet by mouth 2 (two) times daily. Reported on 03/21/2015    . Phosphatidylserine-DHA-EPA (VAYACOG) 100-19.5-6.5 MG CAPS Take 1 capsule by mouth daily. Reported on 03/21/2015    . pseudoephedrine (SUDAFED) 30 MG tablet Take 30-60 mg by mouth 3 (three) times daily as needed for congestion. Reported on 03/21/2015    . ranitidine (ZANTAC) 150 MG capsule Take 150 mg by mouth every evening. Reported on 03/21/2015    . Vitamin D, Ergocalciferol, (DRISDOL) 50000 UNITS CAPS Take 50,000 Units by mouth every 7 (seven) days. Reported on 03/21/2015    . XARELTO 20 MG TABS tablet Take 20 mg by mouth daily with supper. Reported on 03/21/2015  1   No current facility-administered medications  on file prior to visit.        Objective:   Physical Exam Blood pressure 104/62, pulse 100, temperature 98.2 F (36.8 C), height 5\' 5"  (1.651 m), weight 233 lb 3.2 oz (105.779 kg). Alert and oriented. Skin warm and dry. Oral mucosa is moist.   . Sclera anicteric, conjunctivae is pink. Thyroid not enlarged. No cervical lymphadenopathy. Lungs clear. Heart regular rate and rhythm.  Abdomen is soft. Bowel sounds are positive. No hepatomegaly. No abdominal masses felt. Slight mid abdominal tenderness. .  No edema to lower extremities.         Assessment & Plan:  Elevated  Transaminases slight. Will repeat a Hepatic function today. GERD: Will try her on Omeprazole 40mg  daily and see how she does. She may need an EGD in the near future. Will wait until after her knee surgery. Patient had been taking Diclofenac twice a day and is now on hold.  OV in 3 months.

## 2015-03-21 NOTE — Patient Instructions (Addendum)
Rx for Omeprazole. Nees to loose weight.  OV in 3 weeks.

## 2015-03-22 LAB — HEPATIC FUNCTION PANEL
ALT: 25 U/L (ref 6–29)
AST: 21 U/L (ref 10–35)
Albumin: 4.1 g/dL (ref 3.6–5.1)
Alkaline Phosphatase: 83 U/L (ref 33–130)
Bilirubin, Direct: 0.1 mg/dL (ref <=0.2)
Indirect Bilirubin: 0.3 mg/dL (ref 0.2–1.2)
Total Bilirubin: 0.4 mg/dL (ref 0.2–1.2)
Total Protein: 6.6 g/dL (ref 6.1–8.1)

## 2015-03-23 ENCOUNTER — Inpatient Hospital Stay (HOSPITAL_COMMUNITY)
Admission: RE | Admit: 2015-03-23 | Discharge: 2015-03-25 | DRG: 470 | Disposition: A | Discharge status: Home Health | Payer: BLUE CROSS/BLUE SHIELD | Source: Ambulatory Visit | Attending: Orthopedic Surgery | Admitting: Orthopedic Surgery

## 2015-03-23 ENCOUNTER — Inpatient Hospital Stay (HOSPITAL_COMMUNITY): Payer: BLUE CROSS/BLUE SHIELD | Admitting: Certified Registered Nurse Anesthetist

## 2015-03-23 ENCOUNTER — Encounter (HOSPITAL_COMMUNITY)
Admission: RE | Disposition: A | Discharge status: Home Health | Payer: Self-pay | Source: Ambulatory Visit | Attending: Orthopedic Surgery

## 2015-03-23 ENCOUNTER — Inpatient Hospital Stay (HOSPITAL_COMMUNITY): Payer: BLUE CROSS/BLUE SHIELD | Admitting: Emergency Medicine

## 2015-03-23 ENCOUNTER — Encounter (HOSPITAL_COMMUNITY): Payer: Self-pay | Admitting: Certified Registered Nurse Anesthetist

## 2015-03-23 DIAGNOSIS — Z87891 Personal history of nicotine dependence: Secondary | ICD-10-CM

## 2015-03-23 DIAGNOSIS — K5900 Constipation, unspecified: Secondary | ICD-10-CM | POA: Diagnosis present

## 2015-03-23 DIAGNOSIS — K219 Gastro-esophageal reflux disease without esophagitis: Secondary | ICD-10-CM | POA: Diagnosis present

## 2015-03-23 DIAGNOSIS — Z96652 Presence of left artificial knee joint: Secondary | ICD-10-CM

## 2015-03-23 DIAGNOSIS — E039 Hypothyroidism, unspecified: Secondary | ICD-10-CM | POA: Diagnosis present

## 2015-03-23 DIAGNOSIS — Z9884 Bariatric surgery status: Secondary | ICD-10-CM | POA: Diagnosis not present

## 2015-03-23 DIAGNOSIS — Z8673 Personal history of transient ischemic attack (TIA), and cerebral infarction without residual deficits: Secondary | ICD-10-CM | POA: Diagnosis not present

## 2015-03-23 DIAGNOSIS — Z6841 Body Mass Index (BMI) 40.0 and over, adult: Secondary | ICD-10-CM

## 2015-03-23 DIAGNOSIS — M1712 Unilateral primary osteoarthritis, left knee: Principal | ICD-10-CM | POA: Diagnosis present

## 2015-03-23 DIAGNOSIS — M25562 Pain in left knee: Secondary | ICD-10-CM | POA: Diagnosis not present

## 2015-03-23 DIAGNOSIS — G4700 Insomnia, unspecified: Secondary | ICD-10-CM | POA: Diagnosis present

## 2015-03-23 DIAGNOSIS — D649 Anemia, unspecified: Secondary | ICD-10-CM | POA: Diagnosis present

## 2015-03-23 DIAGNOSIS — M179 Osteoarthritis of knee, unspecified: Secondary | ICD-10-CM | POA: Diagnosis not present

## 2015-03-23 DIAGNOSIS — M797 Fibromyalgia: Secondary | ICD-10-CM | POA: Diagnosis present

## 2015-03-23 DIAGNOSIS — F988 Other specified behavioral and emotional disorders with onset usually occurring in childhood and adolescence: Secondary | ICD-10-CM | POA: Diagnosis present

## 2015-03-23 DIAGNOSIS — Z96659 Presence of unspecified artificial knee joint: Secondary | ICD-10-CM

## 2015-03-23 DIAGNOSIS — G8918 Other acute postprocedural pain: Secondary | ICD-10-CM | POA: Diagnosis not present

## 2015-03-23 HISTORY — PX: TOTAL KNEE ARTHROPLASTY: SHX125

## 2015-03-23 HISTORY — DX: Iron deficiency anemia, unspecified: D50.9

## 2015-03-23 HISTORY — DX: Obstructive sleep apnea (adult) (pediatric): G47.33

## 2015-03-23 HISTORY — DX: Acute embolism and thrombosis of unspecified deep veins of unspecified lower extremity: I82.409

## 2015-03-23 HISTORY — DX: Dependence on other enabling machines and devices: Z99.89

## 2015-03-23 HISTORY — DX: Unspecified osteoarthritis, unspecified site: M19.90

## 2015-03-23 SURGERY — ARTHROPLASTY, KNEE, TOTAL
Anesthesia: General | Laterality: Left

## 2015-03-23 MED ORDER — TEMAZEPAM 15 MG PO CAPS: 15.0000 mg | ORAL_CAPSULE | Freq: Every evening | ORAL | Status: DC | PRN

## 2015-03-23 MED ORDER — MIDAZOLAM HCL 2 MG/2ML IJ SOLN
INTRAMUSCULAR | Status: AC
  Filled 2015-03-23: qty 2

## 2015-03-23 MED ORDER — FENTANYL CITRATE (PF) 100 MCG/2ML IJ SOLN
INTRAMUSCULAR | Status: AC
  Administered 2015-03-23: 50 ug via INTRAVENOUS
  Filled 2015-03-23: qty 2

## 2015-03-23 MED ORDER — FAMOTIDINE 20 MG PO TABS
10.0000 mg | ORAL_TABLET | Freq: Every evening | ORAL | Status: DC
  Administered 2015-03-23 – 2015-03-24 (×2): 10 mg via ORAL
  Filled 2015-03-23 (×2): qty 1

## 2015-03-23 MED ORDER — CHLORHEXIDINE GLUCONATE 4 % EX LIQD: 60.0000 mL | Freq: Once | CUTANEOUS | Status: DC

## 2015-03-23 MED ORDER — SUGAMMADEX SODIUM 200 MG/2ML IV SOLN
INTRAVENOUS | Status: AC
  Filled 2015-03-23: qty 2

## 2015-03-23 MED ORDER — PROPOFOL 10 MG/ML IV BOLUS
INTRAVENOUS | Status: AC
  Filled 2015-03-23: qty 20

## 2015-03-23 MED ORDER — OXYCODONE HCL 5 MG PO TABS
5.0000 mg | ORAL_TABLET | ORAL | Status: DC | PRN
  Administered 2015-03-23: 5 mg via ORAL
  Administered 2015-03-23 – 2015-03-25 (×12): 10 mg via ORAL
  Filled 2015-03-23 (×12): qty 2

## 2015-03-23 MED ORDER — PHENYLEPHRINE 40 MCG/ML (10ML) SYRINGE FOR IV PUSH (FOR BLOOD PRESSURE SUPPORT)
PREFILLED_SYRINGE | INTRAVENOUS | Status: AC
  Filled 2015-03-23: qty 10

## 2015-03-23 MED ORDER — PANTOPRAZOLE SODIUM 40 MG PO TBEC
40.0000 mg | DELAYED_RELEASE_TABLET | Freq: Every day | ORAL | Status: DC
  Administered 2015-03-23 – 2015-03-25 (×3): 40 mg via ORAL
  Filled 2015-03-23 (×2): qty 1

## 2015-03-23 MED ORDER — LIDOCAINE HCL (CARDIAC) 20 MG/ML IV SOLN
INTRAVENOUS | Status: AC
  Filled 2015-03-23: qty 5

## 2015-03-23 MED ORDER — METOPROLOL TARTRATE 1 MG/ML IV SOLN
INTRAVENOUS | Status: DC | PRN
  Administered 2015-03-23: 2 mg via INTRAVENOUS

## 2015-03-23 MED ORDER — ONDANSETRON HCL 4 MG/2ML IJ SOLN
INTRAMUSCULAR | Status: AC
  Filled 2015-03-23: qty 2

## 2015-03-23 MED ORDER — FENTANYL CITRATE (PF) 100 MCG/2ML IJ SOLN
INTRAMUSCULAR | Status: AC
  Filled 2015-03-23: qty 2

## 2015-03-23 MED ORDER — FERROUS SULFATE 325 (65 FE) MG PO TABS
325.0000 mg | ORAL_TABLET | Freq: Three times a day (TID) | ORAL | Status: DC
  Administered 2015-03-23 – 2015-03-25 (×7): 325 mg via ORAL
  Filled 2015-03-23 (×7): qty 1

## 2015-03-23 MED ORDER — GLUCOSAMINE-CHONDROITIN 500-400 MG PO TABS: 1.0000 | ORAL_TABLET | Freq: Two times a day (BID) | ORAL | Status: DC

## 2015-03-23 MED ORDER — TOPIRAMATE 25 MG PO TABS
150.0000 mg | ORAL_TABLET | Freq: Every day | ORAL | Status: DC
  Administered 2015-03-23 – 2015-03-24 (×2): 150 mg via ORAL
  Filled 2015-03-23 (×2): qty 2

## 2015-03-23 MED ORDER — ONDANSETRON HCL 4 MG PO TABS: 4.0000 mg | ORAL_TABLET | Freq: Four times a day (QID) | ORAL | Status: DC | PRN

## 2015-03-23 MED ORDER — PHENOL 1.4 % MT LIQD: 1.0000 | OROMUCOSAL | Status: DC | PRN

## 2015-03-23 MED ORDER — GABAPENTIN 300 MG PO CAPS
300.0000 mg | ORAL_CAPSULE | Freq: Four times a day (QID) | ORAL | Status: DC
  Administered 2015-03-23 – 2015-03-25 (×9): 300 mg via ORAL
  Filled 2015-03-23 (×4): qty 1
  Filled 2015-03-23: qty 3
  Filled 2015-03-23 (×5): qty 1

## 2015-03-23 MED ORDER — DIPHENHYDRAMINE HCL 12.5 MG/5ML PO ELIX: 12.5000 mg | ORAL_SOLUTION | ORAL | Status: DC | PRN

## 2015-03-23 MED ORDER — FENTANYL CITRATE (PF) 250 MCG/5ML IJ SOLN
INTRAMUSCULAR | Status: AC
  Filled 2015-03-23: qty 5

## 2015-03-23 MED ORDER — KETOROLAC TROMETHAMINE 15 MG/ML IJ SOLN
INTRAMUSCULAR | Status: AC
  Administered 2015-03-23: 15 mg via INTRAVENOUS
  Filled 2015-03-23: qty 1

## 2015-03-23 MED ORDER — HYDROMORPHONE HCL 1 MG/ML IJ SOLN
1.0000 mg | INTRAMUSCULAR | Status: DC | PRN
  Administered 2015-03-25 (×2): 1 mg via INTRAVENOUS
  Filled 2015-03-23 (×2): qty 1

## 2015-03-23 MED ORDER — BUPIVACAINE LIPOSOME 1.3 % IJ SUSP
INTRAMUSCULAR | Status: DC | PRN
  Administered 2015-03-23: 20 mL

## 2015-03-23 MED ORDER — MIDAZOLAM HCL 2 MG/2ML IJ SOLN
2.0000 mg | Freq: Once | INTRAMUSCULAR | Status: DC
  Filled 2015-03-23: qty 2

## 2015-03-23 MED ORDER — KETOROLAC TROMETHAMINE 15 MG/ML IJ SOLN
15.0000 mg | Freq: Four times a day (QID) | INTRAMUSCULAR | Status: AC
  Administered 2015-03-23 – 2015-03-24 (×4): 15 mg via INTRAVENOUS
  Filled 2015-03-23 (×3): qty 1

## 2015-03-23 MED ORDER — ESMOLOL HCL 100 MG/10ML IV SOLN
INTRAVENOUS | Status: DC | PRN
  Administered 2015-03-23: 30 mg via INTRAVENOUS
  Administered 2015-03-23: 20 mg via INTRAVENOUS

## 2015-03-23 MED ORDER — TRANEXAMIC ACID 1000 MG/10ML IV SOLN
2000.0000 mg | INTRAVENOUS | Status: DC
  Filled 2015-03-23: qty 20

## 2015-03-23 MED ORDER — DEXAMETHASONE SODIUM PHOSPHATE 10 MG/ML IJ SOLN
INTRAMUSCULAR | Status: DC | PRN
  Administered 2015-03-23: 10 mg via INTRAVENOUS

## 2015-03-23 MED ORDER — PROPOFOL 10 MG/ML IV BOLUS
INTRAVENOUS | Status: DC | PRN
  Administered 2015-03-23: 150 mg via INTRAVENOUS
  Administered 2015-03-23: 50 mg via INTRAVENOUS

## 2015-03-23 MED ORDER — ONDANSETRON 4 MG PO TBDP: 4.0000 mg | ORAL_TABLET | Freq: Four times a day (QID) | ORAL | Status: DC | PRN

## 2015-03-23 MED ORDER — METOPROLOL TARTRATE 1 MG/ML IV SOLN
INTRAVENOUS | Status: AC
  Filled 2015-03-23: qty 5

## 2015-03-23 MED ORDER — ACETAMINOPHEN 325 MG PO TABS: 650.0000 mg | ORAL_TABLET | Freq: Four times a day (QID) | ORAL | Status: DC | PRN

## 2015-03-23 MED ORDER — OXYCODONE HCL 5 MG PO TABS
ORAL_TABLET | ORAL | Status: AC
  Administered 2015-03-23: 5 mg via ORAL
  Filled 2015-03-23: qty 1

## 2015-03-23 MED ORDER — FENTANYL CITRATE (PF) 100 MCG/2ML IJ SOLN
INTRAMUSCULAR | Status: DC | PRN
  Administered 2015-03-23: 150 ug via INTRAVENOUS
  Administered 2015-03-23 (×3): 100 ug via INTRAVENOUS
  Administered 2015-03-23: 150 ug via INTRAVENOUS
  Administered 2015-03-23 (×2): 100 ug via INTRAVENOUS
  Administered 2015-03-23: 50 ug via INTRAVENOUS
  Administered 2015-03-23: 150 ug via INTRAVENOUS

## 2015-03-23 MED ORDER — METHOCARBAMOL 750 MG PO TABS
750.0000 mg | ORAL_TABLET | Freq: Three times a day (TID) | ORAL | Status: DC
  Administered 2015-03-23 – 2015-03-25 (×6): 750 mg via ORAL
  Filled 2015-03-23 (×6): qty 1

## 2015-03-23 MED ORDER — POLYETHYLENE GLYCOL 3350 17 G PO PACK
17.0000 g | PACK | Freq: Every day | ORAL | Status: DC | PRN
  Filled 2015-03-23: qty 1

## 2015-03-23 MED ORDER — BUPIVACAINE LIPOSOME 1.3 % IJ SUSP
20.0000 mL | INTRAMUSCULAR | Status: DC
  Filled 2015-03-23: qty 20

## 2015-03-23 MED ORDER — PROPOFOL 10 MG/ML IV BOLUS: INTRAVENOUS | Status: DC | PRN

## 2015-03-23 MED ORDER — METHOCARBAMOL 500 MG PO TABS
ORAL_TABLET | ORAL | Status: AC
  Administered 2015-03-23: 500 mg via ORAL
  Filled 2015-03-23: qty 1

## 2015-03-23 MED ORDER — DOCUSATE SODIUM 100 MG PO CAPS
100.0000 mg | ORAL_CAPSULE | Freq: Two times a day (BID) | ORAL | Status: DC
  Administered 2015-03-23 – 2015-03-25 (×5): 100 mg via ORAL
  Filled 2015-03-23 (×4): qty 1

## 2015-03-23 MED ORDER — ACETAMINOPHEN 650 MG RE SUPP: 650.0000 mg | Freq: Four times a day (QID) | RECTAL | Status: DC | PRN

## 2015-03-23 MED ORDER — ROCURONIUM BROMIDE 50 MG/5ML IV SOLN
INTRAVENOUS | Status: AC
  Filled 2015-03-23: qty 1

## 2015-03-23 MED ORDER — METOCLOPRAMIDE HCL 5 MG PO TABS: 5.0000 mg | ORAL_TABLET | Freq: Three times a day (TID) | ORAL | Status: DC | PRN

## 2015-03-23 MED ORDER — LEVOTHYROXINE SODIUM 75 MCG PO TABS
175.0000 ug | ORAL_TABLET | Freq: Every day | ORAL | Status: DC
  Administered 2015-03-24 – 2015-03-25 (×2): 175 ug via ORAL
  Filled 2015-03-23 (×2): qty 1

## 2015-03-23 MED ORDER — AMPHETAMINE-DEXTROAMPHETAMINE 5 MG PO TABS: 15.0000 mg | ORAL_TABLET | Freq: Every day | ORAL | Status: DC | PRN

## 2015-03-23 MED ORDER — CEFAZOLIN SODIUM-DEXTROSE 2-3 GM-% IV SOLR
INTRAVENOUS | Status: AC
  Filled 2015-03-23: qty 50

## 2015-03-23 MED ORDER — FENTANYL CITRATE (PF) 100 MCG/2ML IJ SOLN
100.0000 ug | Freq: Once | INTRAMUSCULAR | Status: DC
  Filled 2015-03-23: qty 2

## 2015-03-23 MED ORDER — ONDANSETRON HCL 4 MG/2ML IJ SOLN
INTRAMUSCULAR | Status: DC | PRN
  Administered 2015-03-23: 4 mg via INTRAVENOUS

## 2015-03-23 MED ORDER — FENTANYL 75 MCG/HR TD PT72: 75.0000 ug | MEDICATED_PATCH | TRANSDERMAL | Status: DC

## 2015-03-23 MED ORDER — FENTANYL CITRATE (PF) 100 MCG/2ML IJ SOLN
25.0000 ug | INTRAMUSCULAR | Status: DC | PRN
  Administered 2015-03-23 (×2): 50 ug via INTRAVENOUS

## 2015-03-23 MED ORDER — DEXAMETHASONE SODIUM PHOSPHATE 10 MG/ML IJ SOLN
INTRAMUSCULAR | Status: AC
  Filled 2015-03-23: qty 1

## 2015-03-23 MED ORDER — METOCLOPRAMIDE HCL 5 MG/ML IJ SOLN: 5.0000 mg | Freq: Three times a day (TID) | INTRAMUSCULAR | Status: DC | PRN

## 2015-03-23 MED ORDER — PROPOFOL 10 MG/ML IV BOLUS
INTRAVENOUS | Status: AC
  Filled 2015-03-23: qty 40

## 2015-03-23 MED ORDER — METHOCARBAMOL 1000 MG/10ML IJ SOLN: 500.0000 mg | Freq: Four times a day (QID) | INTRAVENOUS | Status: DC | PRN

## 2015-03-23 MED ORDER — APOAEQUORIN 10 MG PO CAPS: 30.0000 mg | ORAL_CAPSULE | Freq: Every day | ORAL | Status: DC

## 2015-03-23 MED ORDER — SODIUM CHLORIDE 0.9 % IR SOLN
Status: DC | PRN
  Administered 2015-03-23: 1000 mL

## 2015-03-23 MED ORDER — DULOXETINE HCL 60 MG PO CPEP
60.0000 mg | ORAL_CAPSULE | Freq: Two times a day (BID) | ORAL | Status: DC
  Administered 2015-03-23 – 2015-03-25 (×4): 60 mg via ORAL
  Filled 2015-03-23 (×4): qty 1

## 2015-03-23 MED ORDER — SODIUM CHLORIDE 0.9 % IV SOLN: INTRAVENOUS | Status: DC

## 2015-03-23 MED ORDER — TRAZODONE HCL 100 MG PO TABS
100.0000 mg | ORAL_TABLET | Freq: Every evening | ORAL | Status: DC | PRN
  Administered 2015-03-23 – 2015-03-24 (×2): 100 mg via ORAL
  Filled 2015-03-23 (×2): qty 1

## 2015-03-23 MED ORDER — ONDANSETRON HCL 4 MG/2ML IJ SOLN: 4.0000 mg | Freq: Four times a day (QID) | INTRAMUSCULAR | Status: DC | PRN

## 2015-03-23 MED ORDER — ESMOLOL HCL 100 MG/10ML IV SOLN
INTRAVENOUS | Status: AC
  Filled 2015-03-23: qty 10

## 2015-03-23 MED ORDER — MENTHOL 3 MG MT LOZG: 1.0000 | LOZENGE | OROMUCOSAL | Status: DC | PRN

## 2015-03-23 MED ORDER — METHOCARBAMOL 500 MG PO TABS
500.0000 mg | ORAL_TABLET | Freq: Four times a day (QID) | ORAL | Status: DC | PRN
  Administered 2015-03-23 – 2015-03-24 (×2): 500 mg via ORAL
  Filled 2015-03-23: qty 1

## 2015-03-23 MED ORDER — LACTATED RINGERS IV SOLN
INTRAVENOUS | Status: DC
  Administered 2015-03-23 (×2): via INTRAVENOUS

## 2015-03-23 MED ORDER — TRANEXAMIC ACID 1000 MG/10ML IV SOLN
2000.0000 mg | INTRAVENOUS | Status: DC | PRN
  Administered 2015-03-23: 2000 mg via TOPICAL

## 2015-03-23 MED ORDER — RIVAROXABAN 10 MG PO TABS
10.0000 mg | ORAL_TABLET | Freq: Every day | ORAL | Status: DC
  Administered 2015-03-24 – 2015-03-25 (×2): 10 mg via ORAL
  Filled 2015-03-23 (×2): qty 1

## 2015-03-23 MED ORDER — LIDOCAINE HCL (CARDIAC) 20 MG/ML IV SOLN
INTRAVENOUS | Status: DC | PRN
  Administered 2015-03-23: 80 mg via INTRAVENOUS

## 2015-03-23 MED ORDER — SUGAMMADEX SODIUM 200 MG/2ML IV SOLN
INTRAVENOUS | Status: DC | PRN
  Administered 2015-03-23: 200 mg via INTRAVENOUS

## 2015-03-23 MED ORDER — 0.9 % SODIUM CHLORIDE (POUR BTL) OPTIME
TOPICAL | Status: DC | PRN
  Administered 2015-03-23: 1000 mL

## 2015-03-23 MED ORDER — CEFAZOLIN SODIUM-DEXTROSE 2-3 GM-% IV SOLR
2.0000 g | Freq: Four times a day (QID) | INTRAVENOUS | Status: AC
  Administered 2015-03-23 (×2): 2 g via INTRAVENOUS
  Filled 2015-03-23 (×2): qty 50

## 2015-03-23 MED ORDER — CEFAZOLIN SODIUM-DEXTROSE 2-3 GM-% IV SOLR
2.0000 g | INTRAVENOUS | Status: AC
  Administered 2015-03-23: 2 g via INTRAVENOUS

## 2015-03-23 MED ORDER — BISACODYL 5 MG PO TBEC: 5.0000 mg | DELAYED_RELEASE_TABLET | Freq: Every day | ORAL | Status: DC | PRN

## 2015-03-23 MED ORDER — MAGNESIUM CITRATE PO SOLN: 1.0000 | Freq: Once | ORAL | Status: DC | PRN

## 2015-03-23 MED ORDER — SUCCINYLCHOLINE CHLORIDE 20 MG/ML IJ SOLN
INTRAMUSCULAR | Status: AC
  Filled 2015-03-23: qty 1

## 2015-03-23 MED ORDER — ROCURONIUM BROMIDE 100 MG/10ML IV SOLN
INTRAVENOUS | Status: DC | PRN
  Administered 2015-03-23: 40 mg via INTRAVENOUS

## 2015-03-23 MED ORDER — PROMETHAZINE HCL 25 MG/ML IJ SOLN
6.2500 mg | INTRAMUSCULAR | Status: DC | PRN
Start: 2015-03-23 — End: 2015-03-23

## 2015-03-23 MED ORDER — ALUM & MAG HYDROXIDE-SIMETH 200-200-20 MG/5ML PO SUSP: 30.0000 mL | ORAL | Status: DC | PRN

## 2015-03-23 SURGICAL SUPPLY — 56 items
BLADE SAG 18X100X1.27 (BLADE) ×2 IMPLANT
BLADE SAGITTAL 25.0X1.27X90 (BLADE) ×2 IMPLANT
BLADE SAW SAG 73X12.5 (BLADE) ×2 IMPLANT
BLADE SURG 21 STRL SS (BLADE) ×4 IMPLANT
BNDG COHESIVE 6X5 TAN STRL LF (GAUZE/BANDAGES/DRESSINGS) ×2 IMPLANT
BNDG GAUZE ELAST 4 BULKY (GAUZE/BANDAGES/DRESSINGS) ×2 IMPLANT
BONE CEMENT PALACOSE (Orthopedic Implant) ×4 IMPLANT
BOWL SMART MIX CTS (DISPOSABLE) ×2 IMPLANT
CAPT KNEE TOTAL 3 ATTUNE ×2 IMPLANT
CEMENT BONE PALACOSE (Orthopedic Implant) ×2 IMPLANT
COVER SURGICAL LIGHT HANDLE (MISCELLANEOUS) ×4 IMPLANT
CUFF TOURNIQUET SINGLE 34IN LL (TOURNIQUET CUFF) ×2 IMPLANT
CUFF TOURNIQUET SINGLE 44IN (TOURNIQUET CUFF) IMPLANT
DRAPE EXTREMITY T 121X128X90 (DRAPE) ×2 IMPLANT
DRAPE PROXIMA HALF (DRAPES) ×2 IMPLANT
DRAPE U-SHAPE 47X51 STRL (DRAPES) ×2 IMPLANT
DRSG ADAPTIC 3X8 NADH LF (GAUZE/BANDAGES/DRESSINGS) ×2 IMPLANT
DRSG PAD ABDOMINAL 8X10 ST (GAUZE/BANDAGES/DRESSINGS) ×2 IMPLANT
DURAPREP 26ML APPLICATOR (WOUND CARE) ×2 IMPLANT
ELECT REM PT RETURN 9FT ADLT (ELECTROSURGICAL) ×2
ELECTRODE REM PT RTRN 9FT ADLT (ELECTROSURGICAL) ×1 IMPLANT
FACESHIELD WRAPAROUND (MASK) ×2 IMPLANT
GAUZE SPONGE 4X4 12PLY STRL (GAUZE/BANDAGES/DRESSINGS) ×2 IMPLANT
GLOVE BIO SURGEON STRL SZ 6.5 (GLOVE) ×2 IMPLANT
GLOVE BIOGEL PI IND STRL 9 (GLOVE) ×1 IMPLANT
GLOVE BIOGEL PI INDICATOR 9 (GLOVE) ×1
GLOVE SURG ORTHO 9.0 STRL STRW (GLOVE) ×4 IMPLANT
GOWN STRL REUS W/ TWL XL LVL3 (GOWN DISPOSABLE) ×2 IMPLANT
GOWN STRL REUS W/TWL XL LVL3 (GOWN DISPOSABLE) ×2
HANDPIECE INTERPULSE COAX TIP (DISPOSABLE) ×1
IMMOBILIZER KNEE 22  40 CIR (ORTHOPEDIC SUPPLIES) ×1
IMMOBILIZER KNEE 22 40 CIR (ORTHOPEDIC SUPPLIES) ×1 IMPLANT
KIT BASIN OR (CUSTOM PROCEDURE TRAY) ×2 IMPLANT
KIT ROOM TURNOVER OR (KITS) ×2 IMPLANT
MANIFOLD NEPTUNE II (INSTRUMENTS) ×2 IMPLANT
NEEDLE SPNL 18GX3.5 QUINCKE PK (NEEDLE) ×2 IMPLANT
NS IRRIG 1000ML POUR BTL (IV SOLUTION) ×2 IMPLANT
PACK TOTAL JOINT (CUSTOM PROCEDURE TRAY) ×2 IMPLANT
PACK UNIVERSAL I (CUSTOM PROCEDURE TRAY) ×2 IMPLANT
PAD ABD 8X10 STRL (GAUZE/BANDAGES/DRESSINGS) ×2 IMPLANT
PAD ARMBOARD 7.5X6 YLW CONV (MISCELLANEOUS) ×2 IMPLANT
PADDING CAST COTTON 6X4 STRL (CAST SUPPLIES) ×2 IMPLANT
SET HNDPC FAN SPRY TIP SCT (DISPOSABLE) ×1 IMPLANT
SPONGE GAUZE 4X4 12PLY STER LF (GAUZE/BANDAGES/DRESSINGS) ×2 IMPLANT
STAPLER VISISTAT 35W (STAPLE) ×2 IMPLANT
SUCTION FRAZIER HANDLE 10FR (MISCELLANEOUS)
SUCTION TUBE FRAZIER 10FR DISP (MISCELLANEOUS) IMPLANT
SUT VIC AB 0 CT1 27 (SUTURE) ×1
SUT VIC AB 0 CT1 27XBRD ANBCTR (SUTURE) ×1 IMPLANT
SUT VIC AB 1 CTX 36 (SUTURE)
SUT VIC AB 1 CTX36XBRD ANBCTR (SUTURE) IMPLANT
SYR 50ML LL SCALE MARK (SYRINGE) ×2 IMPLANT
TOWEL OR 17X24 6PK STRL BLUE (TOWEL DISPOSABLE) ×2 IMPLANT
TOWEL OR 17X26 10 PK STRL BLUE (TOWEL DISPOSABLE) ×2 IMPLANT
TRAY FOLEY CATH 16FRSI W/METER (SET/KITS/TRAYS/PACK) IMPLANT
WRAP KNEE MAXI GEL POST OP (GAUZE/BANDAGES/DRESSINGS) ×2 IMPLANT

## 2015-03-23 NOTE — Anesthesia Postprocedure Evaluation (Signed)
Anesthesia Post Note  Patient: AVANA JANIAK  Procedure(s) Performed: Procedure(s) (LRB): TOTAL KNEE ARTHROPLASTY (Left)  Patient location during evaluation: PACU Anesthesia Type: General and Regional Level of consciousness: awake Vital Signs Assessment: post-procedure vital signs reviewed and stable Respiratory status: spontaneous breathing Cardiovascular status: stable Anesthetic complications: no    Last Vitals:  Filed Vitals:   03/23/15 1114 03/23/15 1115  BP:  130/89  Pulse: 71 74  Temp:    Resp: 12 12    Last Pain:  Filed Vitals:   03/23/15 1118  PainSc: 8                  EDWARDS,Jim Philemon

## 2015-03-23 NOTE — Evaluation (Signed)
Physical Therapy Evaluation Patient Details Name: Debbie Pineda MRN: [id number] DOB: 1960-11-07 Today's Date: 03/23/2015   History of Present Illness  Admitted for LTKA;  has a past medical history of Degenerative disk disease; Degenerative joint disease; Hyperglycemia; Morbid obesity ([place]); Thrombophlebitis; Fibromyalgia; PFO (patent foramen ovale); ADD (attention deficit disorder); Anemia; Hypothyroid; GERD (gastroesophageal reflux disease); Insomnia; Scoliosis; Family history of adverse reaction to anesthesia; History of blood clots; Headache; Dizziness; Stroke [facility]); Joint pain; Joint swelling; Chronic back pain; Osteoarthritis; Constipation; Nausea; Incomplete emptying of bladder; and Depression.  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  Clinical Impression   Pt is s/p TKA resulting in the deficits listed below (see PT Problem List).  Pt will benefit from skilled PT to increase their independence and safety with mobility to allow discharge to the venue listed below.      Follow Up Recommendations Home health PT;Supervision/Assistance - 24 hour    Equipment Recommendations  Rolling walker with 5" wheels;3in1 (PT)    Recommendations for Other Services OT consult     Precautions / Restrictions Precautions Precautions: Knee Precaution Booklet Issued: Yes (comment) Precaution Comments: Pt educated to not allow any pillow or bolster under knee for healing with optimal range of motion.  Required Braces or Orthoses: Knee Immobilizer - Left Restrictions Weight Bearing Restrictions: Yes LLE Weight Bearing: Weight bearing as tolerated      Mobility  Bed Mobility               General bed mobility comments: Had gotten up to EOB with RN  Transfers Overall transfer level: Needs assistance Equipment used: Rolling walker (2  wheeled) Transfers: Sit to/from Stand Sit to Stand: Min assist         General transfer comment: Cues for hand placememt and safety  Ambulation/Gait Ambulation/Gait assistance: Min assist Ambulation Distance (Feet):  (pivot steps bed to [facility] to recliner) Assistive device: Rolling walker (2 wheeled) Gait Pattern/deviations: Shuffle     General Gait Details: Cues to activate quad for L stance stability; noted occasional buckling  Stairs            Wheelchair Mobility    Modified Rankin (Stroke Patients Only)       Balance Overall balance assessment: Needs assistance           Standing balance-Leahy Scale: Poor                               Pertinent Vitals/Pain Pain Assessment: 0-10 Pain Score: 8  Pain Location: L knee Pain Descriptors / Indicators: Aching;Grimacing;Guarding Pain Intervention(s): Limited activity within patient's tolerance;Monitored during session    Home Living Family/patient expects to be discharged to:: Private residence Living Arrangements: Spouse/significant other Available Help at Discharge: Family Type of Home: House Home Access: Stairs to enter Entrance Stairs-Rails: Psychiatric nurse of Steps: 2 Home Layout: One level Home Equipment: Environmental consultant - 2 wheels (Will need to confirm DME)      Prior Function Level of Independence: Independent               Hand Dominance        Extremity/Trunk Assessment   Upper Extremity Assessment: Overall WFL for tasks assessed           Lower Extremity Assessment: LLE deficits/detail   LLE Deficits / Details: Grossly decr AROM  and strength, limited by pain     Communication   Communication: No difficulties  Cognition Arousal/Alertness: Awake/alert Behavior During Therapy: WFL for tasks assessed/performed Overall Cognitive Status: Within Functional Limits for tasks assessed                      General Comments General comments (skin  integrity, edema, etc.): Session conducted on supplemental O2; O2 sats 100% HR 60 at end of session    Exercises        Assessment/Plan    PT Assessment Patient needs continued PT services  PT Diagnosis Difficulty walking;Acute pain   PT Problem List Decreased strength;Decreased range of motion;Decreased activity tolerance;Decreased balance;Decreased mobility;Decreased knowledge of use of DME;Decreased knowledge of precautions;Pain  PT Treatment Interventions DME instruction;Gait training;Stair training;Functional mobility training;Therapeutic activities;Therapeutic exercise;Patient/family education   PT Goals (Current goals can be found in the Care Plan section) Acute Rehab PT Goals Patient Stated Goal: did not state PT Goal Formulation: With patient Time For Goal Achievement: 03/30/15 Potential to Achieve Goals: Good    Frequency 7X/week   Barriers to discharge        Co-evaluation               End of Session Equipment Utilized During Treatment: Left knee immobilizer;Gait belt Activity Tolerance: Patient limited by pain Patient left: in chair;with call bell/phone within reach;with family/visitor present Nurse Communication: Mobility status         Time: 1437-1500 PT Time Calculation (min) (ACUTE ONLY): 23 min   Charges:   PT Evaluation $PT Eval Moderate Complexity: 1 Procedure PT Treatments $Therapeutic Activity: 8-22 mins   PT G Codes:        Quin Hoop 03/23/2015, 3:16 PM  Roney Marion, [place] Pager [phone] Office [phone]

## 2015-03-23 NOTE — Progress Notes (Signed)
Report given to steve shaw rn as caregiver 

## 2015-03-23 NOTE — Clinical Social Work Note (Signed)
CSW received referral for SNF.  Case discussed with case manager, and plan is to discharge home.  CSW to sign off please re-consult if social work needs arise.  Ava Tangney R. Jaymen Fetch, MSW, LCSWA [phone]  

## 2015-03-23 NOTE — Anesthesia Preprocedure Evaluation (Signed)
Anesthesia Evaluation  Patient identified by MRN, date of birth, ID band Patient awake    Reviewed: Allergy & Precautions, NPO status , Patient's Chart, lab work & pertinent test results  Airway Mallampati: II  TM Distance: >3 FB Neck ROM: Full    Dental   Pulmonary sleep apnea , former smoker,    breath sounds clear to auscultation       Cardiovascular  Rhythm:Regular Rate:Normal  History noted. CE   Neuro/Psych    GI/Hepatic Neg liver ROS, GERD  ,  Endo/Other  Hypothyroidism   Renal/GU negative Renal ROS     Musculoskeletal  (+) Arthritis , Fibromyalgia -  Abdominal   Peds  Hematology  (+) anemia ,   Anesthesia Other Findings   Reproductive/Obstetrics                             Anesthesia Physical Anesthesia Plan  ASA: III  Anesthesia Plan: General   Post-op Pain Management:    Induction: Intravenous  Airway Management Planned: Oral ETT  Additional Equipment:   Intra-op Plan:   Post-operative Plan: Extubation in OR  Informed Consent: I have reviewed the patients History and Physical, chart, labs and discussed the procedure including the risks, benefits and alternatives for the proposed anesthesia with the patient or authorized representative who has indicated his/her understanding and acceptance.   Dental advisory given  Plan Discussed with: CRNA and Anesthesiologist  Anesthesia Plan Comments:         Anesthesia Quick Evaluation

## 2015-03-23 NOTE — Addendum Note (Signed)
Addendum  created 03/23/15 1250 by Finis Bud, MD   Modules edited: Anesthesia Blocks and Procedures, Clinical Notes   Clinical Notes:  File: [id number]

## 2015-03-23 NOTE — Transfer of Care (Signed)
Immediate Anesthesia Transfer of Care Note  Patient: Debbie Pineda  Procedure(s) Performed: Procedure(s): TOTAL KNEE ARTHROPLASTY (Left)  Patient Location: PACU  Anesthesia Type:GA combined with regional for post-op pain  Level of Consciousness: awake, alert , oriented and patient cooperative  Airway & Oxygen Therapy: Patient Spontanous Breathing and Patient connected to nasal cannula oxygen  Post-op Assessment: Report given to RN and Post -op Vital signs reviewed and stable  Post vital signs: Reviewed and stable  Last Vitals:  Filed Vitals:   03/23/15 0737  BP: 116/53  Pulse: 72  Temp: 37.1 C  Resp: 20    Complications: No apparent anesthesia complications

## 2015-03-23 NOTE — Op Note (Signed)
03/23/2015  10:45 AM  PATIENT:  Debbie Pineda    PRE-OPERATIVE DIAGNOSIS:  Osteoarthritis Left Knee  POST-OPERATIVE DIAGNOSIS:  Same  PROCEDURE:  TOTAL KNEE ARTHROPLASTY  SURGEON:  Newt Minion, MD  PHYSICIAN ASSISTANT:None ANESTHESIA:   General  PREOPERATIVE INDICATIONS:  [name] is a  55 y.o. female with a diagnosis of Osteoarthritis Left Knee who failed conservative measures and elected for surgical management.    The risks benefits and alternatives were discussed with the patient preoperatively including but not limited to the risks of infection, bleeding, nerve injury, cardiopulmonary complications, the need for revision surgery, among others, and the patient was willing to proceed.  OPERATIVE IMPLANTS: [place] total knee arthroplasty with size 5 tibia, size 5 femur narrow, 8 mm polyethylene tray, 32 mm patella  OPERATIVE FINDINGS: 5 valgus with 3 external rotation femoral cuts  OPERATIVE PROCEDURE: Patient brought to the operating room after undergoing a femoral block she then underwent a general anesthetic. After adequate levels anesthesia obtained patient's left lower extremity was prepped using DuraPrep draped into a sterile field. A timeout was called. A midline incision was made carried down to the medial parapatellar retinacular incision. The patella was everted intramedullary guide was used to the femur and 9 mm was taken off the distal femur with 5 of valgus. Attention was then focused on the tibia. After the femoral cut was made there was bleeding from the bone and the tourniquet was inflated. The tibial cut was made with 3 posterior slope neutral varus valgus and 9 mm was taken off the tibia. Attention was then focused on the femur. The femur was sized for a size 5 and the cuts were made for the size 5 femur with chamfer cuts and box cuts. Trial components were placed the rotation was checked the flexion-extension gaps were checked and the patient was stable  with 8 mm polyethylene tray. Lug cuts drills were made for the femur. The patella was resurfaced and 9 mm was taken off the patella of the patella was extremely small and a 32 mm patella was chosen. Knee was stable with varus and valgus stress with the trial components. Trial component removed the pop 2 fossa was injected with X Burrell 20 mL. The knee was soaked with trans-Amick acid. The tibia and tibial tray were inserted cemented and loose cement was removed. The femoral component was then inserted and cemented and the patella was also cemented the clamps were left in place the knee was again irrigated with pulsatile lavage and the knee was left in extension until the cement hardened. The tourniquet was deflated and hemostasis was obtained. The knee was placed through a range of motion the knee was stable with the patella in place. The patella tracked midline. The retinaculum was closed using #1 Vicryl. The knee was then again placed through a full range of motion and the knee was stable. The subcutaneous is closed using #1 Vicryl the skin was closed using staples. Sterile compressive dressing was applied patient was placed in a knee immobilizer extubated taken the PACU in stable condition.

## 2015-03-23 NOTE — Progress Notes (Signed)
No Chest Xray needed per Dr. Sharol Given.

## 2015-03-23 NOTE — Anesthesia Procedure Notes (Addendum)
Procedure Name: Intubation Date/Time: 03/23/2015 9:12 AM Performed by: Salli Quarry WEAVER Pre-anesthesia Checklist: Patient identified, Emergency Drugs available, Suction available and Patient being monitored Patient Re-evaluated:Patient Re-evaluated prior to inductionOxygen Delivery Method: Circle system utilized Preoxygenation: Pre-oxygenation with 100% oxygen Intubation Type: IV induction Ventilation: Mask ventilation without difficulty Laryngoscope Size: Mac and 3 Grade View: Grade I Tube type: Oral Tube size: 8.0 mm Number of attempts: 1 Airway Equipment and Method: Stylet Placement Confirmation: ETT inserted through vocal cords under direct vision,  positive ETCO2 and breath sounds checked- equal and bilateral Secured at: 22 cm Tube secured with: Tape Dental Injury: Teeth and Oropharynx as per pre-operative assessment    Anesthesia Regional Block:  Femoral nerve block  Pre-Anesthetic Checklist: ,, timeout performed, Correct Patient, Correct Site, Correct Laterality, Correct Position, site marked, risks and benefits discussed, surgical consent, pre-op evaluation,  At surgeon's request and post-op pain management  Laterality: Left  Prep: chloraprep       Needles:   Needle Type: Stimulator Needle - 80          Additional Needles:  Procedures: Doppler guided and nerve stimulator Femoral nerve block Narrative:  Start time: 03/23/2015 8:40 AM End time: 03/23/2015 8:58 AM Injection made incrementally with aspirations every 5 mL.  Performed by: Personally  Anesthesiologist: Finis Bud

## 2015-03-23 NOTE — Discharge Instructions (Signed)

## 2015-03-23 NOTE — H&P (Signed)
TOTAL KNEE ADMISSION H&P  Patient is being admitted for left total knee arthroplasty.  Subjective:  Chief Complaint:left knee pain.  HPI: Debbie Pineda, 55 y.o. female, has a history of pain and functional disability in the left knee due to arthritis and has failed non-surgical conservative treatments for greater than 12 weeks to includeNSAID's and/or analgesics, corticosteriod injections, viscosupplementation injections, use of assistive devices and activity modification.  Onset of symptoms was gradual, starting 8 years ago with gradually worsening course since that time. The patient noted no past surgery on the left knee(s).  Patient currently rates pain in the left knee(s) at 8 out of 10 with activity. Patient has night pain, worsening of pain with activity and weight bearing, pain that interferes with activities of daily living, pain with passive range of motion, crepitus and joint swelling.  Patient has evidence of subchondral cysts, subchondral sclerosis, periarticular osteophytes, joint subluxation and joint space narrowing by imaging studies. This patient has had avascular necrosis of the knee. There is no active infection.  Patient Active Problem List   Diagnosis Date Noted  . Elevated LFTs 03/17/2012  . CVA (cerebral infarction) 10/30/2011  . PFO (patent foramen ovale) 10/30/2011  . Bradycardia 10/30/2011  . Chest pain 10/30/2011  . Hypothyroid   . Thrombophlebitis   . Sleep apnea   . Fibromyalgia   . Degenerative joint disease   . 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 Morbid obesity ([place]) 11/10/2010   Past Medical History  Diagnosis Date  . Degenerative disk disease   . Degenerative joint disease   . Hyperglycemia   . Morbid obesity ([place])   . Thrombophlebitis   . Fibromyalgia   . PFO (patent foramen ovale)   . ADD (attention deficit disorder)     takes Adderall daily  . Anemia     takes Ferrous Sulfate daily  . Hypothyroid     takes SYnthroid daily   . GERD (gastroesophageal reflux disease)     takes Zantac daily  . Insomnia     takes Trazodone nightly  . Scoliosis   . Family history of adverse reaction to anesthesia     a family member woke up during surgery but not sure if mom or dad  . History of blood clots     superficial  . Headache     occasionally  . Dizziness     occasionally  . Stroke [facility])     right sided weakness  . Joint pain   . Joint swelling     knees and ankles  . Chronic back pain   . Osteoarthritis   . Constipation     takes stool softener daily  . Nausea     takes Zofran as needed.Seeing GI doc  . Incomplete emptying of bladder   . Depression     takes Cymbalta daily for pain per pt    Past Surgical History  Procedure Laterality Date  . Cholecystectomy    . Knee surgery Bilateral     Left knee with pinning  . Sclerosing of vein Right     x 2   . 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 Exploratory laparotomy    . Right knee surgery Right     Knee arthroscopy  . Colonoscopy N/A 03/25/2013    Procedure: COLONOSCOPY;  Surgeon: Rogene Houston, MD;  Location: [facility];  Service: Endoscopy;  Laterality: N/A;  930  . Vaginal hysterectomy    . Esophagogastroduodenoscopy    .  Knee pinning       rt  knee     No prescriptions prior to admission   Allergies  Allergen Reactions  . Flexeril [Cyclobenzaprine Hcl] Other (See Comments)    Numbness of extremities  . Morphine And Related Itching    Upper torso  . Septra [Bactrim] Itching and Rash    Upper torso  . Sulfa Antibiotics Itching and Rash    Itching was upper torso  . Sulfamethoxazole-Trimethoprim Itching and Rash    Bactrim  . Tape Itching and Rash    Please use "paper" tape    Social History  Substance Use Topics  . Smoking status: Former Smoker -- 0.00 packs/day for 0 years    Types: Cigarettes  . Smokeless tobacco: Never Used     Comment: quit smoking 20+ y rs ago  . Alcohol Use: No    Family History  Problem Relation Age  of Onset  . Cancer Father     brain  . Heart disease Father   . Heart disease Brother   . Colon cancer Neg Hx      Review of Systems  All other systems reviewed and are negative.   Objective:  Physical Exam  Vital signs in last 24 hours:    Labs:   Estimated body mass index is 40.40 kg/(m^2) as calculated from the following:   Height as of 03/25/13: 5\' 5"  (1.651 m).   Weight as of 11/25/14: 110.133 kg (242 lb 12.8 oz).   Imaging Review Plain radiographs demonstrate moderate degenerative joint disease of the left knee(s). The overall alignment ismild varus. The bone quality appears to be adequate for age and reported activity level.  Assessment/Plan:  End stage arthritis, left knee   The patient history, physical examination, clinical judgment of the provider and imaging studies are consistent with end stage degenerative joint disease of the left knee(s) and total knee arthroplasty is deemed medically necessary. The treatment options including medical management, injection therapy arthroscopy and arthroplasty were discussed at length. The risks and benefits of total knee arthroplasty were presented and reviewed. The risks due to aseptic loosening, infection, stiffness, patella tracking problems, thromboembolic complications and other imponderables were discussed. The patient acknowledged the explanation, agreed to proceed with the plan and consent was signed. Patient is being admitted for inpatient treatment for surgery, pain control, PT, OT, prophylactic antibiotics, VTE prophylaxis, progressive ambulation and ADL's and discharge planning. The patient is planning to be discharged home with home health services

## 2015-03-23 NOTE — Progress Notes (Signed)
Utilization review completed.  

## 2015-03-24 ENCOUNTER — Encounter (HOSPITAL_COMMUNITY): Payer: Self-pay | Admitting: Orthopedic Surgery

## 2015-03-24 NOTE — Progress Notes (Signed)
Patient ID: Debbie Pineda, female   DOB: 1960/10/27, 55 y.o.   MRN: [id number] Postoperative day 1 left total knee arthroplasty. Patient states she has been independently ambulating with her walker without problems. Plan for physical therapy today weightbearing as tolerated possible discharge to home on Friday.

## 2015-03-24 NOTE — Progress Notes (Signed)
   03/24/15 1206  Clinical Encounter Type  Visited With Patient and family together;Health care provider  Visit Type Initial  Referral From Patient  [place] responded to a request to complete an advance directive. Chaplain walked patient through the form, and we will return with a notary this afternoon to complete the form. Chaplain support available as needed.   Jeri Lager, Chaplain 03/24/2015 12:07 PM

## 2015-03-24 NOTE — Care Management Note (Signed)
Case Management Note  Patient Details  Name: Debbie Pineda MRN: [id number] Date of Birth: Jun 05, 1960  Subjective/Objective:  55 yr old female s/p left total knee arthroplasty.                Action/Plan:   Expected Discharge Date:                  Expected Discharge Plan:  [place]  In-House Referral:  NA  Discharge planning Services  CM Consult  Post Acute Care Choice:  Durable Medical Equipment, Home Health Choice offered to:  Patient  DME Arranged:    DME Agency:     HH Arranged:  PT [place]:  [place]  Status of Service:  In process, will continue to follow  Medicare Important Message Given:    Date Medicare IM Given:    Medicare IM give by:    Date Additional Medicare IM Given:    Additional Medicare Important Message give by:     If discussed at [place] of Stay Meetings, dates discussed:    Additional Comments:  Nanayaa, [place], RN 03/24/2015, 1:39 PM

## 2015-03-24 NOTE — Progress Notes (Signed)
   03/24/15 1414  Clinical Encounter Type  Visited With Patient and family together;Health care provider  Visit Type Follow-up  Referral From Patient  [place] responded to a request to complete an advance directive. Chaplain worked with our Patent examiner and the [employer] volunteers to complete the paperwork. Chaplain also put a copy of the form in patient's chart. Chaplain support available as needed.   Jeri Lager, Chaplain 03/24/2015 2:15 PM

## 2015-03-24 NOTE — Progress Notes (Signed)
Physical Therapy Treatment Patient Details Name: Debbie Pineda MRN: [id number] DOB: 1960/03/24 Today's Date: 03/24/2015    History of Present Illness Admitted for LTKA;  has a past medical history of Degenerative disk disease; Degenerative joint disease; Hyperglycemia; Morbid obesity ([place]); Thrombophlebitis; Fibromyalgia; ADD (attention deficit disorder); Anemia; Hypothyroid; Scoliosis; Headache; Dizziness; Stroke [facility]); Osteoarthritis; Depression; Knee surgery (Bilateral)    PT Comments    Making good progress with mobility and gait.  Pain limiting factor   Follow Up Recommendations  Home health PT;Supervision/Assistance - 24 hour     Equipment Recommendations  Rolling walker with 5" wheels;3in1 (PT)    Recommendations for Other Services       Precautions / Restrictions Precautions Precautions: Knee Precaution Comments: Reviewed knee precautions with patient and husband Required Braces or Orthoses: Knee Immobilizer - Left Knee Immobilizer - Left: On when out of bed or walking Restrictions Weight Bearing Restrictions: Yes LLE Weight Bearing: Weight bearing as tolerated    Mobility  Bed Mobility Overal bed mobility: Needs Assistance Bed Mobility: Sit to Supine       Sit to supine: Min assist   General bed mobility comments: Assist to bring LLE onto bed  Transfers Overall transfer level: Needs assistance Equipment used: Rolling walker (2 wheeled) Transfers: Sit to/from Stand Sit to Stand: Min guard         General transfer comment: Using correct hand placement.  Assist for safety.  Ambulation/Gait Ambulation/Gait assistance: Min assist Ambulation Distance (Feet): 110 Feet Assistive device: Rolling walker (2 wheeled) Gait Pattern/deviations: Step-to pattern;Decreased stance time - left;Decreased step length - right;Decreased step length - left;Decreased stride length;Decreased weight shift to left;Antalgic Gait velocity: decreased Gait velocity  interpretation: Below normal speed for age/gender General Gait Details: Multiple rest stops in stance due to pain per patient.   Stairs            Wheelchair Mobility    Modified Rankin (Stroke Patients Only)       Balance                                    Cognition Arousal/Alertness: Awake/alert Behavior During Therapy: WFL for tasks assessed/performed Overall Cognitive Status: Within Functional Limits for tasks assessed                      Exercises Total Joint Exercises Ankle Circles/Pumps: AROM;Both;10 reps;Supine Quad Sets: AROM;Left;5 reps;Supine    General Comments        Pertinent Vitals/Pain Pain Assessment: 0-10 Pain Score: 6  Pain Location: Lt knee Pain Descriptors / Indicators: Aching;Sore Pain Intervention(s): Monitored during session;Premedicated before session;Repositioned    Home Living                      Prior Function            PT Goals (current goals can now be found in the care plan section) Progress towards PT goals: Progressing toward goals    Frequency  7X/week    PT Plan Current plan remains appropriate    Co-evaluation             End of Session Equipment Utilized During Treatment: Gait belt;Left knee immobilizer Activity Tolerance: Patient limited by pain Patient left: in bed;with call bell/phone within reach;with family/visitor present     Time: [id number] PT Time Calculation (min) (ACUTE ONLY): 18 min  Charges:  $Gait Training:  8-22 mins                    G Codes:      Julieana, Demirjian March 27, 2015, 11:59 AM [name]. [name], [place] Pager [phone]

## 2015-03-24 NOTE — Progress Notes (Signed)
Physical Therapy Treatment Patient Details Name: Debbie Pineda MRN: [id number] DOB: 1960-06-22 Today's Date: 03/24/2015    History of Present Illness Admitted for LTKA;  has a past medical history of Degenerative disk disease; Degenerative joint disease; Hyperglycemia; Morbid obesity ([place]); Thrombophlebitis; Fibromyalgia; ADD (attention deficit disorder); Anemia; Hypothyroid; Scoliosis; Headache; Dizziness; Stroke [facility]); Osteoarthritis; Depression; Knee surgery (Bilateral)    PT Comments    Patient making improvements with mobility and gait.  Follow Up Recommendations  Home health PT;Supervision/Assistance - 24 hour     Equipment Recommendations  Rolling walker with 5" wheels;3in1 (PT)    Recommendations for Other Services       Precautions / Restrictions Precautions Precautions: Knee Precaution Comments: Reviewed knee precautions with patient and husband Required Braces or Orthoses: Knee Immobilizer - Left Knee Immobilizer - Left: On when out of bed or walking Restrictions Weight Bearing Restrictions: Yes LLE Weight Bearing: Weight bearing as tolerated    Mobility  Bed Mobility Overal bed mobility: Needs Assistance Bed Mobility: Supine to Sit     Supine to sit: Min guard Sit to supine: Min assist   General bed mobility comments: Instructed patient to use sheet to assist LLE off of bed.  Able to complete with min guard assist for safety.  Transfers Overall transfer level: Needs assistance Equipment used: Rolling walker (2 wheeled) Transfers: Sit to/from Stand Sit to Stand: Min guard         General transfer comment: Using correct hand placement.  Assist for safety.  Ambulation/Gait Ambulation/Gait assistance: Min guard Ambulation Distance (Feet): 120 Feet Assistive device: Rolling walker (2 wheeled) Gait Pattern/deviations: Step-to pattern;Decreased stance time - left;Decreased step length - right;Decreased step length - left;Decreased stride  length;Decreased weight shift to left;Antalgic Gait velocity: decreased Gait velocity interpretation: Below normal speed for age/gender General Gait Details: Required 2 standing rest breaks due to pain.  Safe use of RW demonstrated.  Continued to cue for heel strike LLE.   Stairs            Wheelchair Mobility    Modified Rankin (Stroke Patients Only)       Balance                                    Cognition Arousal/Alertness: Lethargic;Suspect due to medications Behavior During Therapy: [facility] for tasks assessed/performed Overall Cognitive Status: Within Functional Limits for tasks assessed                      Exercises Total Joint Exercises Ankle Circles/Pumps: AROM;Both;10 reps;Supine Quad Sets: AROM;Left;10 reps;Supine Short Arc Quad: AROM;Left;10 reps;Supine Heel Slides: AAROM;Left;5 reps;Supine Hip ABduction/ADduction: AROM;Left;10 reps;Supine Goniometric ROM: -25 extension    General Comments        Pertinent Vitals/Pain Pain Assessment: 0-10 Pain Score: 6  Pain Location: Lt knee Pain Descriptors / Indicators: Aching;Sore Pain Intervention(s): Monitored during session;Repositioned;Premedicated before session    Home Living                      Prior Function            PT Goals (current goals can now be found in the care plan section) Progress towards PT goals: Progressing toward goals    Frequency  7X/week    PT Plan Current plan remains appropriate    Co-evaluation             End  of Session Equipment Utilized During Treatment: Gait belt;Left knee immobilizer Activity Tolerance: Patient limited by pain Patient left: in chair;with family/visitor present (In bathroom.)     Time: [id number] PT Time Calculation (min) (ACUTE ONLY): 27 min  Charges:  $Gait Training: 8-22 mins $Therapeutic Exercise: 8-22 mins                    G Codes:      Paizleigh, Hanahan Apr 19, 2015, 3:34 PM  [name]. [name],  [place] Pager [phone]

## 2015-03-24 NOTE — Progress Notes (Signed)
Occupational Therapy Evaluation Patient Details Name: Debbie Pineda MRN: [id number] DOB: Nov 10, 1960 Today's Date: 03/24/2015    History of Present Illness Admitted for LTKA;  has a past medical history of Degenerative disk disease; Degenerative joint disease; Hyperglycemia; Morbid obesity ([place]); Thrombophlebitis; Fibromyalgia; ADD (attention deficit disorder); Anemia; Hypothyroid; Scoliosis; Headache; Dizziness; Stroke [facility]); Osteoarthritis; Depression; Knee surgery (Bilateral)   Clinical Impression   PTA, pt was independent with ADLs and mobility. Pt currently requires min assist with LB ADLs and min guard assist for functional transfers. Began education on compensatory strategies for ADLs, energy conservation, and safe DME use for transfers. Pt plans to d/c home with 24/7 assistance from her husband and mother. Pt will benefit from continued acute OT to increase independence and safety with ADLs and mobility to allow for safe discharge home.     Follow Up Recommendations  No OT follow up;Supervision/Assistance - 24 hour    Equipment Recommendations  3 in 1 bedside comode;Other (comment) (RW-2 wheeled)    Recommendations for Other Services       Precautions / Restrictions Precautions Precautions: Knee Precaution Booklet Issued: No Precaution Comments: Reviewed knee precautions with patient and husband Required Braces or Orthoses: Knee Immobilizer - Left Knee Immobilizer - Left: On when out of bed or walking Restrictions Weight Bearing Restrictions: Yes LLE Weight Bearing: Weight bearing as tolerated      Mobility Bed Mobility Overal bed mobility: Needs Assistance Bed Mobility: Supine to Sit;Sit to Supine     Supine to sit: Min assist;HOB elevated Sit to supine: Min assist   General bed mobility comments: Min assist to move LLE on and off bed. HOB elevated and use of bedrails.  Transfers Overall transfer level: Needs assistance Equipment used: Rolling walker (2  wheeled) Transfers: Sit to/from Stand Sit to Stand: Min guard         General transfer comment: Assist for safety due to pt's increased pain. Verbal cues for safe hand placement.    Balance Overall balance assessment: Needs assistance Sitting-balance support: No upper extremity supported;Feet supported Sitting balance-Leahy Scale: Fair     Standing balance support: Bilateral upper extremity supported;During functional activity Standing balance-Leahy Scale: Poor Standing balance comment: Reliant on UE support                             ADL Overall ADL's : Needs assistance/impaired     Grooming: Wash/dry hands;Min guard;Standing           Upper Body Dressing : Set up;Sitting   Lower Body Dressing: Minimal assistance;With caregiver independent assisting;Sit to/from stand Lower Body Dressing Details (indicate cue type and reason): Unable to reach bilateral feet Toilet Transfer: Min guard;Cueing for safety;Comfort height toilet;RW Armed forces technical officer Details (indicate cue type and reason): Pt declined using BSC over toilet, but will be using one at home. Encouraged pt to use it while here for sequencing purposes. Toileting- Water quality scientist and Hygiene: Min guard;Sit to/from stand       Functional mobility during ADLs: Min guard;Rolling walker General ADL Comments: Began education on compensatory strategies for ADLs and safety     Vision Vision Assessment?: No apparent visual deficits   Perception     Praxis      Pertinent Vitals/Pain Pain Assessment: 0-10 Pain Score: 8  Pain Location: L knee Pain Descriptors / Indicators: Aching;Sore Pain Intervention(s): Limited activity within patient's tolerance;Monitored during session;Repositioned;Premedicated before session     Hand Dominance Right   Extremity/Trunk  Assessment Upper Extremity Assessment Upper Extremity Assessment: Overall WFL for tasks assessed   Lower Extremity Assessment Lower  Extremity Assessment: LLE deficits/detail LLE Deficits / Details: Grossly decr AROM and strength, limited by pain LLE: Unable to fully assess due to pain   Cervical / Trunk Assessment Cervical / Trunk Assessment: Normal   Communication Communication Communication: No difficulties   Cognition Arousal/Alertness: Awake/alert Behavior During Therapy: WFL for tasks assessed/performed Overall Cognitive Status: Within Functional Limits for tasks assessed                     General Comments       Exercises Exercises: Total Joint     Shoulder Instructions      Home Living Family/patient expects to be discharged to:: Private residence Living Arrangements: Spouse/significant other;Other relatives;Children Available Help at Discharge: Family;Available 24 hours/day Type of Home: House Home Access: Stairs to enter [employer] of Steps: 2 Entrance Stairs-Rails: Right;Left Home Layout: One level     Bathroom Shower/Tub: Walk-in shower;Tub only;Door   [employer] Toilet: Standard Bathroom Accessibility: Yes How Accessible: Accessible via walker Home Equipment: Walker - 2 wheels;Hand held shower head   Additional Comments: Pt will bathe in garden tub with 3in1 and hand held showerhead      Prior Functioning/Environment Level of Independence: Independent             OT Diagnosis: Acute pain   OT Problem List: Decreased strength;Decreased range of motion;Decreased activity tolerance;Impaired balance (sitting and/or standing);Decreased coordination;Decreased safety awareness;Decreased knowledge of use of DME or AE;Decreased knowledge of precautions;Pain;Obesity   OT Treatment/Interventions: Therapeutic exercise;Self-care/ADL training;DME and/or AE instruction;Energy conservation;Therapeutic activities;Patient/family education;Balance training    OT Goals(Current goals can be found in the care plan section) Acute Rehab OT Goals Patient Stated Goal: to decrease  pain OT Goal Formulation: With patient Time For Goal Achievement: 04/07/15 Potential to Achieve Goals: Good ADL Goals Pt Will Perform Lower Body Bathing: with supervision;sitting/lateral leans;sit to/from stand Pt Will Perform Lower Body Dressing: with supervision;sitting/lateral leans;sit to/from stand Pt Will Transfer to Toilet: with supervision;ambulating;bedside commode (with BSC over toilet) Pt Will Perform Toileting - Clothing Manipulation and hygiene: with supervision;sitting/lateral leans;sit to/from stand Pt Will Perform Tub/Shower Transfer: Tub transfer;with min guard assist;ambulating;3 in 1;rolling walker  OT Frequency: Min 2X/week   Barriers to D/C:            Co-evaluation              End of Session Equipment Utilized During Treatment: Gait belt;Rolling walker;Left knee immobilizer Nurse Communication: Mobility status  Activity Tolerance: Patient limited by pain Patient left: in bed;with call bell/phone within reach;with family/visitor present;with SCD's reapplied   Time: [id number] OT Time Calculation (min): 27 min Charges:  OT General Charges $OT Visit: 1 Procedure OT Evaluation $OT Eval Moderate Complexity: 1 Procedure OT Treatments $Self Care/Home Management : 8-22 mins G-Codes:    Redmond Baseman, OTR/L Pager: [phone] 03/24/2015, 3:57 PM

## 2015-03-25 MED ORDER — METHOCARBAMOL 500 MG PO TABS: 500.0000 mg | ORAL_TABLET | Freq: Three times a day (TID) | ORAL | Status: DC

## 2015-03-25 MED ORDER — ASPIRIN EC 325 MG PO TBEC: 325.0000 mg | DELAYED_RELEASE_TABLET | Freq: Every day | ORAL | Status: DC

## 2015-03-25 MED ORDER — RIVAROXABAN 20 MG PO TABS: 20.0000 mg | ORAL_TABLET | Freq: Every day | ORAL | Status: DC

## 2015-03-25 MED ORDER — OXYCODONE-ACETAMINOPHEN 5-325 MG PO TABS: 1.0000 | ORAL_TABLET | ORAL | Status: DC | PRN

## 2015-03-25 MED ORDER — OXYCODONE HCL 10 MG PO TABS: 10.0000 mg | ORAL_TABLET | Freq: Three times a day (TID) | ORAL | Status: DC | PRN

## 2015-03-25 NOTE — Progress Notes (Signed)
Physical Therapy Progress Note  Patient has done well with mobility and gait.  Safe with ambulation and stairs with assist by husband.  Patient ready for d/c from PT perspective.    03/25/15 1344  PT Visit Information  Last PT Received On 03/25/15  Assistance Needed +1  History of Present Illness Admitted for LTKA;  has a past medical history of Degenerative disk disease; Degenerative joint disease; Hyperglycemia; Morbid obesity ([place]); Thrombophlebitis; Fibromyalgia; ADD (attention deficit disorder); Anemia; Hypothyroid; Scoliosis; Headache; Dizziness; Stroke [facility]); Osteoarthritis; Depression; Knee surgery (Bilateral)  PT Time Calculation  PT Start Time (ACUTE ONLY) 1328  PT Stop Time (ACUTE ONLY) 1343  PT Time Calculation (min) (ACUTE ONLY) 15 min  Subjective Data  Subjective Feels better.  Ready to go home.  Precautions  Precautions Knee  Precaution Comments Reviewed knee precautions with patient and husband  Required Braces or Orthoses Knee Immobilizer - Left  Knee Immobilizer - Left On when out of bed or walking  Restrictions  Weight Bearing Restrictions Yes  LLE Weight Bearing WBAT  Pain Assessment  Pain Assessment 0-10  Pain Score 7  Pain Location Lt knee  Pain Descriptors / Indicators Aching  Pain Intervention(s) Monitored during session;Premedicated before session;Repositioned  Cognition  Arousal/Alertness Awake/alert  Behavior During Therapy WFL for tasks assessed/performed  Overall Cognitive Status Within Functional Limits for tasks assessed  Bed Mobility  Overal bed mobility Needs Assistance  Bed Mobility Sit to Supine  Sit to supine Min assist  General bed mobility comments Husband assists patient to bring LLE onto bed to return to supine.  Transfers  Overall transfer level Needs assistance  Equipment used Rolling walker (2 wheeled)  Transfers Sit to/from Stand  Sit to Stand Min guard  General transfer comment Assist for safety.  Ambulation/Gait   Ambulation/Gait assistance Min guard  Ambulation Distance (Feet) 130 Feet  Assistive device Rolling walker (2 wheeled)  Gait Pattern/deviations Step-to pattern;Decreased stance time - left;Decreased step length - right;Decreased stride length;Decreased weight shift to left;Antalgic  General Gait Details Improved gait.  Husband able to assist safely.  Gait velocity decreased  Gait velocity interpretation Below normal speed for age/gender  Stairs Yes  Stairs assistance Min assist  Stair Management One rail Left;Step to pattern;Forwards;With cane  Number of Stairs 3  General stair comments Reviewed instructions with patient and husband.  Husband able to provide min assist to help patient negotiate 3 steps with 1 rail and cane using step-to pattern.  Min verbal cues.  PT - End of Session  Equipment Utilized During Treatment Gait belt;Left knee immobilizer  Activity Tolerance Patient tolerated treatment well  Patient left in bed;with call bell/phone within reach;with family/visitor present  Nurse Communication Mobility status (Ready for d/c.  Waiting for RW and 3-in-1 BSC)  PT - Assessment/Plan  PT Plan Current plan remains appropriate  PT Frequency (ACUTE ONLY) 7X/week  Follow Up Recommendations Home health PT;Supervision/Assistance - 24 hour  PT equipment Rolling walker with 5" wheels;3in1 (PT)  PT Goal Progression  Progress towards PT goals Progressing toward goals  PT General Charges  $$ ACUTE PT VISIT 1 Procedure  PT Treatments  $Gait Training 8-22 mins  [name]. [name], [place] Pager [phone]

## 2015-03-25 NOTE — Progress Notes (Signed)
Physical Therapy Treatment Patient Details Name: Debbie Pineda MRN: [id number] DOB: October 15, 1960 Today's Date: 03/25/2015    History of Present Illness Admitted for LTKA;  has a past medical history of Degenerative disk disease; Degenerative joint disease; Hyperglycemia; Morbid obesity ([name]); Thrombophlebitis; Fibromyalgia; ADD (attention deficit disorder); Anemia; Hypothyroid; Scoliosis; Headache; Dizziness; Stroke [facility]); Osteoarthritis; Depression; Knee surgery (Bilateral)    PT Comments    Patient making progress with mobility.  Able to negotiate stairs with min-mod assist.  Wants to practice this pm.  Pain continues to limit patient.  Follow Up Recommendations  Home health PT;Supervision/Assistance - 24 hour     Equipment Recommendations  Rolling walker with 5" wheels;3in1 (PT)    Recommendations for Other Services       Precautions / Restrictions Precautions Precautions: Knee Precaution Comments: Reviewed knee precautions with patient and husband Required Braces or Orthoses: Knee Immobilizer - Left (Husband and patient able to don/doff independently) Knee Immobilizer - Left: On when out of bed or walking Restrictions Weight Bearing Restrictions: Yes LLE Weight Bearing: Weight bearing as tolerated    Mobility  Bed Mobility Overal bed mobility: Needs Assistance Bed Mobility: Supine to Sit     Supine to sit: Min assist     General bed mobility comments: Husband assists patient to bring LLE off of bed.  Transfers Overall transfer level: Needs assistance Equipment used: Rolling walker (2 wheeled) Transfers: Sit to/from Stand Sit to Stand: Min guard         General transfer comment: Assist for safety.  Ambulation/Gait Ambulation/Gait assistance: Min guard Ambulation Distance (Feet): 40 Feet Assistive device: Rolling walker (2 wheeled) Gait Pattern/deviations: Step-to pattern;Decreased stance time - left;Decreased step length - right;Decreased step length -  left;Decreased weight shift to left;Antalgic Gait velocity: decreased Gait velocity interpretation: Below normal speed for age/gender General Gait Details: Safe use of RW.   Stairs Stairs: Yes Stairs assistance: Min assist Stair Management: One rail Left;Step to pattern;Sideways;Forwards;With cane Number of Stairs: 6 (3 steps x2) General stair comments: Instructed patient to negotiate stairs sideways using 1 rail.  Required min to mod assist and difficult to step up with RLE.  Attempted forward with 1 rail and cane - much easier for patient.  Instructed husband to assist patient safely.  Wheelchair Mobility    Modified Rankin (Stroke Patients Only)       Balance                                    Cognition Arousal/Alertness: Awake/alert Behavior During Therapy: WFL for tasks assessed/performed Overall Cognitive Status: Within Functional Limits for tasks assessed                      Exercises      General Comments        Pertinent Vitals/Pain Pain Assessment: 0-10 Pain Score: 8  Pain Location: Lt knee Pain Descriptors / Indicators: Aching;Throbbing Pain Intervention(s): Monitored during session;Repositioned;Patient requesting pain meds-RN notified    Home Living                      Prior Function            PT Goals (current goals can now be found in the care plan section) Progress towards PT goals: Progressing toward goals    Frequency  7X/week    PT Plan Current plan remains appropriate  Co-evaluation             End of Session Equipment Utilized During Treatment: Gait belt;Left knee immobilizer Activity Tolerance: Patient limited by pain Patient left: in chair;with family/visitor present     Time: [id number] PT Time Calculation (min) (ACUTE ONLY): 34 min  Charges:  $Gait Training: 23-37 mins                    G Codes:      Debbie Pineda, Debbie Pineda 2015-04-01, 12:21 PM Debbie Pineda. Debbie Pineda, [name] Pager [phone]

## 2015-03-25 NOTE — Progress Notes (Signed)
While trying to remove 1400 dose of Gabapentin 300mg  from Pyxis, the door accidentally closed before I could remove medication. Tried to go back in to remove medication and Pyxis would not let me. Went back in and removed 1800 dose of Gabapentin 300 mg to administer.

## 2015-03-25 NOTE — Progress Notes (Signed)
Occupational Therapy Treatment Patient Details Name: Debbie Pineda MRN: [id number] DOB: [birth year] Today's Date: 03/25/2015    History of present illness Admitted for [place];  has a past medical history of Degenerative disk disease; Degenerative joint disease; Hyperglycemia; Morbid obesity ([place]); Thrombophlebitis; Fibromyalgia; ADD (attention deficit disorder); Anemia; Hypothyroid; Scoliosis; Headache; Dizziness; Stroke [facility]); Osteoarthritis; Depression; Knee surgery (Bilateral)   OT comments  Pt. Making gains with acute OT goals.  Husband present and active in tx. Session, able to assist pt. Safely.  Reviewed bathing plans.  No safe tub/shower option until pt. More stable on LLE.  Agree to sponge bathe initially and will defer tub/shower to [facility] therapies.    Follow Up Recommendations  No OT follow up;Supervision/Assistance - 24 hour    Equipment Recommendations  3 in 1 bedside comode;Other (comment)    Recommendations for Other Services      Precautions / Restrictions Precautions Precautions: Knee Required Braces or Orthoses: Knee Immobilizer - Left Knee Immobilizer - Left: On when out of bed or walking Restrictions LLE Weight Bearing: Weight bearing as tolerated       Mobility Bed Mobility Overal bed mobility: Needs Assistance Bed Mobility: Supine to Sit     Supine to sit: Supervision     General bed mobility comments: HOB flat, no rail, exits from right at home.  able to prop up on b ues. husband able to guide LLE out of bed while pt. brings rle towards eob  Transfers Overall transfer level: Needs assistance Equipment used: Rolling walker (2 wheeled) Transfers: Sit to/from Stand Sit to Stand: Min guard              Balance                                   ADL Overall ADL's : Needs assistance/impaired     Grooming: Wash/dry hands;Wash/dry face;Supervision/safety;Standing         Lower Body Bathing Details (indicate cue type and reason):  husband present for session and reports he will be assisting initially     Lower Body Dressing: Maximal assistance;With caregiver independent assisting;With adaptive equipment;Cueing for sequencing;Cueing for compensatory techniques Lower Body Dressing Details (indicate cue type and reason): husband able to safely don KI, also available to assist with LB dressing. report she has a Secondary school teacher already and is interested in purscase of sock-aide (provided demo for use) Armed forces technical officer: Min guard;Ambulation;BSC;Comfort height toilet Toilet Transfer Details (indicate cue type and reason): 3n1 over commode with trash can to place LLE on, pt. also likes to prop RLE on walker Toileting- Clothing Manipulation and Hygiene: Sit to/from stand;Supervision/safety     Tub/Shower Transfer Details (indicate cue type and reason): see below Functional mobility during ADLs: Min guard;Rolling walker General ADL Comments: reviewed bathing plans. pt. has small walk in shower that she is not sure a 3n1 would fit in, and a large garden tub with a stair to enter.  at this time pt. is still in [place], making transfers difficult.  also reviewed uncertainty that the garden tub is a safe option due to stair and wide ledge.  provided demo of ant/post. transfer into shower stall with option of having BLES out of shower stall .  at this time pt. and spouse feel sponge bathing is best option until KI is not longer needed and they have had chance to practice with [facility] therapies.  Vision                     Perception     Praxis      Cognition   Behavior During Therapy: WFL for tasks assessed/performed Overall Cognitive Status: Within Functional Limits for tasks assessed                       Extremity/Trunk Assessment               Exercises     Shoulder Instructions       General Comments      Pertinent Vitals/ Pain       Pain Assessment: 0-10 Pain Score: 6  Pain Location: L knee Pain  Descriptors / Indicators: Aching Pain Intervention(s): Monitored during session;Premedicated before session;Repositioned  Home Living                                          Prior Functioning/Environment              Frequency Min 2X/week     Progress Toward Goals  OT Goals(current goals can now be found in the care plan section)  Progress towards OT goals: Progressing toward goals     Plan Discharge plan remains appropriate    Co-evaluation                 End of Session Equipment Utilized During Treatment: Gait belt;Rolling walker;Other (comment) (left KI)   Activity Tolerance Patient tolerated treatment well   Patient Left in chair;with call bell/phone within reach;with family/visitor present   Nurse Communication          Time: [id number] OT Time Calculation (min): 33 min  Charges: OT General Charges $OT Visit: 1 Procedure OT Treatments $Self Care/Home Management : 23-37 mins  Janice Coffin, COTA/L 03/25/2015, 8:52 AM

## 2015-03-25 NOTE — Progress Notes (Signed)
Patient ID: Debbie Pineda, female   DOB: Jun 06, 1960, 55 y.o.   MRN: [id number] Plan for discharge to home today if safe with physical therapy.. Prescriptions and orders written

## 2015-03-25 NOTE — Discharge Summary (Signed)
Physician Discharge Summary  Patient ID: Debbie Pineda MRN: [id number] DOB/AGE: August 04, 1960 55 y.o.  Admit date: 03/23/2015 Discharge date: 03/25/2015  Admission Diagnoses:Osteoarthritis left knee  Discharge Diagnoses:  Active Problems:   Total knee replacement status   Discharged Condition: stable  Hospital Course: Patient's hospital course was essentially unremarkable. She underwent total knee arthroplasty. Postoperatively patient progressed well and was discharged to home in stable condition.  Consults: None  Significant Diagnostic Studies: labs: Routine labs  Treatments: surgery: See operative note  Discharge Exam: Blood pressure 124/66, pulse 90, temperature 98 F (36.7 C), temperature source Oral, resp. rate 18, height 5\' 5"  (1.651 m), weight 105.688 kg (233 lb), SpO2 96 %. Incision/Wound: Dressing clean and dry  Disposition: 01-Home or Self Care     Medication List    ASK your doctor about these medications        amphetamine-dextroamphetamine 30 MG tablet  Commonly known as:  ADDERALL  Take 15 mg by mouth daily as needed (for tiredness).     amphetamine-dextroamphetamine 30 MG 24 hr capsule  Commonly known as:  ADDERALL XR  Take 30 mg by mouth daily.     CALCIUM 600-D 600-400 MG-UNIT Tabs  Generic drug:  Calcium Carbonate-Vitamin D3  Take 1 tablet by mouth 2 (two) times daily. Reported on 03/21/2015     [employer] B-12 1500 MCG Tbdp  Generic drug:  Cyanocobalamin  Place 3,000 mg under the tongue 2 (two) times a week. Reported on 03/21/2015     diclofenac sodium 1 % Gel  Commonly known as:  VOLTAREN  Apply topically 4 (four) times daily. Reported on 03/21/2015     Diclofenac-Misoprostol 50-0.2 MG Tbec  Take 1 tablet by mouth 2 (two) times daily. Reported on 03/21/2015     docusate sodium 100 MG capsule  Commonly known as:  COLACE  Take 200 mg by mouth 2 (two) times daily. Reported on 03/21/2015     DULoxetine 60 MG capsule  Commonly known as:  CYMBALTA  Take  60 mg by mouth 2 (two) times daily.     fentaNYL 75 MCG/HR  Commonly known as:  DURAGESIC - dosed mcg/hr  Place 75 mcg onto the skin every 3 (three) days. Reported on 03/21/2015     Ferrous Sulfate 140 (45 Fe) MG Tbcr  Take 45 mg by mouth daily.     furosemide 20 MG tablet  Commonly known as:  LASIX  Take 20 mg by mouth as needed. Reported on 03/21/2015     gabapentin 300 MG capsule  Commonly known as:  NEURONTIN  Take 300 mg by mouth 4 (four) times daily.     GLUCOSAMINE-CHONDROITIN DS 500-400 MG tablet  Generic drug:  glucosamine-chondroitin  Take 1 tablet by mouth 2 (two) times daily. Reported on 03/21/2015     ibuprofen 200 MG tablet  Commonly known as:  ADVIL,MOTRIN  Take 600 mg by mouth daily as needed. Reported on 03/21/2015     ibuprofen 200 MG tablet  Commonly known as:  ADVIL,MOTRIN  Take 600 mg by mouth 2 (two) times daily as needed for headache, mild pain or moderate pain. Reported on 03/21/2015     ketoconazole 2 % cream  Commonly known as:  NIZORAL  Apply 1 application topically 2 (two) times daily. Reported on 03/21/2015     Krill Oil 300 MG Caps  Take 300 mg by mouth at bedtime. Reported on 03/21/2015     levothyroxine 100 MCG tablet  Commonly known as:  SYNTHROID, LEVOTHROID  175 mcg.     methocarbamol 750 MG tablet  Commonly known as:  ROBAXIN  Take 750 mg by mouth 3 (three) times daily.     MOVANTIK 25 MG Tabs tablet  Generic drug:  naloxegol oxalate  Take 25 mg by mouth daily as needed (for constipation). Reported on 03/21/2015     MULTIVITAMIN/IRON PO  Take 1 tablet by mouth 2 (two) times daily. Reported on 03/21/2015     omeprazole 40 MG capsule  Commonly known as:  PRILOSEC  Take 1 capsule (40 mg total) by mouth daily.     ondansetron 4 MG disintegrating tablet  Commonly known as:  ZOFRAN-ODT  Take 4 mg by mouth every 6 (six) hours as needed for nausea.     Oxycodone HCl 10 MG Tabs  TK 1 T PO Q 4-6 H PRN     PREVAGEN 10 MG Caps  Generic  drug:  Apoaequorin  Take 30 mg by mouth daily. Reported on 03/21/2015     pseudoephedrine 30 MG tablet  Commonly known as:  SUDAFED  Take 30-60 mg by mouth 3 (three) times daily as needed for congestion. Reported on 03/21/2015     ranitidine 150 MG capsule  Commonly known as:  ZANTAC  Take 150 mg by mouth every evening. Reported on 03/21/2015     simethicone 80 MG chewable tablet  Commonly known as:  MYLICON  Chew 80 mg by mouth as needed. Reported on 03/21/2015     topiramate 100 MG tablet  Commonly known as:  TOPAMAX  Take 150 mg by mouth at bedtime.     traZODone 50 MG tablet  Commonly known as:  DESYREL  Take 100-300 mg by mouth at bedtime as needed for sleep.     VAYACOG 100-19.5-6.5 MG Caps  Generic drug:  Phosphatidylserine-DHA-EPA  Take 1 capsule by mouth daily. Reported on 03/21/2015     Vitamin D (Ergocalciferol) 50000 units Caps capsule  Commonly known as:  DRISDOL  Take 50,000 Units by mouth every 7 (seven) days. Reported on 03/21/2015     XARELTO 20 MG Tabs tablet  Generic drug:  rivaroxaban  Take 20 mg by mouth daily with supper. Reported on 03/21/2015           Follow-up Information    Follow up with Vasilia Dise V, MD In 2 weeks.   Specialty:  Orthopedic Surgery   Contact information:   [place] [address] [phone]       Follow up with [place].   Contact information:   [address] [place] [address] [phone]       Signed: Newt Minion 03/25/2015, 6:14 AM

## 2015-03-25 NOTE — Care Management Note (Signed)
Case Management Note  Patient Details  Name: Debbie Pineda MRN: [id number] Date of Birth: [birth year]  Subjective/Objective:    55 yr old female s/pleft total knee arthroplasty.                Action/Plan: Patient requested [place], CM called referral to Estrella Myrtle, [place] Specialist. Patient will have family support at discharge.    Expected Discharge Date:   03/25/15               Expected Discharge Plan:  [place]  In-House Referral:  NA  Discharge planning Services  CM Consult  Post Acute Care Choice:  Durable Medical Equipment, Home Health Choice offered to:  Patient  DME Arranged:  3-N-1, Walker rolling DME Agency:  [place]:  PT [place]:  [place]  Status of Service:  Completed, signed off  Medicare Important Message Given:    Date Medicare IM Given:    Medicare IM give by:    Date Additional Medicare IM Given:    Additional Medicare Important Message give by:     If discussed at [place] of Stay Meetings, dates discussed:    Additional Comments:  [place], [place], RN 03/25/2015, 2:03 PM

## 2015-04-04 DIAGNOSIS — M1712 Unilateral primary osteoarthritis, left knee: Secondary | ICD-10-CM | POA: Diagnosis not present

## 2015-04-07 DIAGNOSIS — N342 Other urethritis: Secondary | ICD-10-CM | POA: Diagnosis not present

## 2015-04-07 DIAGNOSIS — I482 Chronic atrial fibrillation: Secondary | ICD-10-CM | POA: Diagnosis not present

## 2015-04-07 DIAGNOSIS — Z1389 Encounter for screening for other disorder: Secondary | ICD-10-CM | POA: Diagnosis not present

## 2015-04-07 DIAGNOSIS — Z6838 Body mass index (BMI) 38.0-38.9, adult: Secondary | ICD-10-CM | POA: Diagnosis not present

## 2015-04-11 DIAGNOSIS — M6281 Muscle weakness (generalized): Secondary | ICD-10-CM | POA: Diagnosis not present

## 2015-04-11 DIAGNOSIS — M25662 Stiffness of left knee, not elsewhere classified: Secondary | ICD-10-CM | POA: Diagnosis not present

## 2015-04-11 DIAGNOSIS — M25462 Effusion, left knee: Secondary | ICD-10-CM | POA: Diagnosis not present

## 2015-04-11 DIAGNOSIS — M25562 Pain in left knee: Secondary | ICD-10-CM | POA: Diagnosis not present

## 2015-04-13 DIAGNOSIS — M6281 Muscle weakness (generalized): Secondary | ICD-10-CM | POA: Diagnosis not present

## 2015-04-13 DIAGNOSIS — M25562 Pain in left knee: Secondary | ICD-10-CM | POA: Diagnosis not present

## 2015-04-13 DIAGNOSIS — M25662 Stiffness of left knee, not elsewhere classified: Secondary | ICD-10-CM | POA: Diagnosis not present

## 2015-04-13 DIAGNOSIS — M25462 Effusion, left knee: Secondary | ICD-10-CM | POA: Diagnosis not present

## 2015-04-14 DIAGNOSIS — M6281 Muscle weakness (generalized): Secondary | ICD-10-CM | POA: Diagnosis not present

## 2015-04-14 DIAGNOSIS — M25662 Stiffness of left knee, not elsewhere classified: Secondary | ICD-10-CM | POA: Diagnosis not present

## 2015-04-14 DIAGNOSIS — M25462 Effusion, left knee: Secondary | ICD-10-CM | POA: Diagnosis not present

## 2015-04-14 DIAGNOSIS — M25562 Pain in left knee: Secondary | ICD-10-CM | POA: Diagnosis not present

## 2015-05-02 DIAGNOSIS — M329 Systemic lupus erythematosus, unspecified: Secondary | ICD-10-CM | POA: Diagnosis not present

## 2015-05-02 DIAGNOSIS — R413 Other amnesia: Secondary | ICD-10-CM | POA: Diagnosis not present

## 2015-05-02 DIAGNOSIS — G4709 Other insomnia: Secondary | ICD-10-CM | POA: Diagnosis not present

## 2015-05-02 DIAGNOSIS — Z6839 Body mass index (BMI) 39.0-39.9, adult: Secondary | ICD-10-CM | POA: Diagnosis not present

## 2015-05-02 DIAGNOSIS — Q211 Atrial septal defect: Secondary | ICD-10-CM | POA: Diagnosis not present

## 2015-05-02 DIAGNOSIS — G609 Hereditary and idiopathic neuropathy, unspecified: Secondary | ICD-10-CM | POA: Diagnosis not present

## 2015-05-02 DIAGNOSIS — I639 Cerebral infarction, unspecified: Secondary | ICD-10-CM | POA: Diagnosis not present

## 2015-05-18 DIAGNOSIS — M25562 Pain in left knee: Secondary | ICD-10-CM | POA: Diagnosis not present

## 2015-05-18 DIAGNOSIS — M25462 Effusion, left knee: Secondary | ICD-10-CM | POA: Diagnosis not present

## 2015-05-18 DIAGNOSIS — M6281 Muscle weakness (generalized): Secondary | ICD-10-CM | POA: Diagnosis not present

## 2015-05-18 DIAGNOSIS — M25662 Stiffness of left knee, not elsewhere classified: Secondary | ICD-10-CM | POA: Diagnosis not present

## 2015-06-01 DIAGNOSIS — M25562 Pain in left knee: Secondary | ICD-10-CM | POA: Diagnosis not present

## 2015-06-01 DIAGNOSIS — M25662 Stiffness of left knee, not elsewhere classified: Secondary | ICD-10-CM | POA: Diagnosis not present

## 2015-06-01 DIAGNOSIS — M25462 Effusion, left knee: Secondary | ICD-10-CM | POA: Diagnosis not present

## 2015-06-01 DIAGNOSIS — M6281 Muscle weakness (generalized): Secondary | ICD-10-CM | POA: Diagnosis not present

## 2015-06-03 DIAGNOSIS — M25462 Effusion, left knee: Secondary | ICD-10-CM | POA: Diagnosis not present

## 2015-06-03 DIAGNOSIS — M6281 Muscle weakness (generalized): Secondary | ICD-10-CM | POA: Diagnosis not present

## 2015-06-03 DIAGNOSIS — M25662 Stiffness of left knee, not elsewhere classified: Secondary | ICD-10-CM | POA: Diagnosis not present

## 2015-06-03 DIAGNOSIS — M25562 Pain in left knee: Secondary | ICD-10-CM | POA: Diagnosis not present

## 2015-06-15 ENCOUNTER — Ambulatory Visit (INDEPENDENT_AMBULATORY_CARE_PROVIDER_SITE_OTHER): Payer: BLUE CROSS/BLUE SHIELD | Admitting: Internal Medicine

## 2015-06-30 DIAGNOSIS — Z1389 Encounter for screening for other disorder: Secondary | ICD-10-CM | POA: Diagnosis not present

## 2015-06-30 DIAGNOSIS — Z6838 Body mass index (BMI) 38.0-38.9, adult: Secondary | ICD-10-CM | POA: Diagnosis not present

## 2015-06-30 DIAGNOSIS — M1712 Unilateral primary osteoarthritis, left knee: Secondary | ICD-10-CM | POA: Diagnosis not present

## 2015-06-30 DIAGNOSIS — M797 Fibromyalgia: Secondary | ICD-10-CM | POA: Diagnosis not present

## 2015-06-30 DIAGNOSIS — N950 Postmenopausal bleeding: Secondary | ICD-10-CM | POA: Diagnosis not present

## 2015-06-30 DIAGNOSIS — R300 Dysuria: Secondary | ICD-10-CM | POA: Diagnosis not present

## 2015-06-30 DIAGNOSIS — M545 Low back pain: Secondary | ICD-10-CM | POA: Diagnosis not present

## 2015-07-06 ENCOUNTER — Other Ambulatory Visit (INDEPENDENT_AMBULATORY_CARE_PROVIDER_SITE_OTHER): Payer: Self-pay | Admitting: Internal Medicine

## 2015-07-21 DIAGNOSIS — S0083XA Contusion of other part of head, initial encounter: Secondary | ICD-10-CM | POA: Diagnosis not present

## 2015-07-21 DIAGNOSIS — Z6837 Body mass index (BMI) 37.0-37.9, adult: Secondary | ICD-10-CM | POA: Diagnosis not present

## 2015-07-21 DIAGNOSIS — R300 Dysuria: Secondary | ICD-10-CM | POA: Diagnosis not present

## 2015-07-21 DIAGNOSIS — M797 Fibromyalgia: Secondary | ICD-10-CM | POA: Diagnosis not present

## 2015-08-01 ENCOUNTER — Other Ambulatory Visit (HOSPITAL_COMMUNITY): Payer: Self-pay | Admitting: Family Medicine

## 2015-08-01 DIAGNOSIS — Z1231 Encounter for screening mammogram for malignant neoplasm of breast: Secondary | ICD-10-CM

## 2015-08-03 ENCOUNTER — Other Ambulatory Visit (INDEPENDENT_AMBULATORY_CARE_PROVIDER_SITE_OTHER): Payer: Self-pay | Admitting: Internal Medicine

## 2015-08-04 ENCOUNTER — Other Ambulatory Visit (INDEPENDENT_AMBULATORY_CARE_PROVIDER_SITE_OTHER): Payer: Self-pay | Admitting: Internal Medicine

## 2015-08-04 ENCOUNTER — Ambulatory Visit (HOSPITAL_COMMUNITY)
Admission: RE | Admit: 2015-08-04 | Discharge: 2015-08-04 | Disposition: A | Discharge status: Home / Self Care | Payer: BLUE CROSS/BLUE SHIELD | Source: Ambulatory Visit | Attending: Family Medicine | Admitting: Family Medicine

## 2015-08-04 DIAGNOSIS — Z1231 Encounter for screening mammogram for malignant neoplasm of breast: Secondary | ICD-10-CM | POA: Diagnosis present

## 2015-08-08 DIAGNOSIS — N950 Postmenopausal bleeding: Secondary | ICD-10-CM | POA: Diagnosis not present

## 2015-08-08 DIAGNOSIS — Z6837 Body mass index (BMI) 37.0-37.9, adult: Secondary | ICD-10-CM | POA: Diagnosis not present

## 2015-08-08 DIAGNOSIS — Z01419 Encounter for gynecological examination (general) (routine) without abnormal findings: Secondary | ICD-10-CM | POA: Diagnosis not present

## 2015-08-12 DIAGNOSIS — Z6837 Body mass index (BMI) 37.0-37.9, adult: Secondary | ICD-10-CM | POA: Diagnosis not present

## 2015-08-12 DIAGNOSIS — M797 Fibromyalgia: Secondary | ICD-10-CM | POA: Diagnosis not present

## 2015-08-12 DIAGNOSIS — Z1389 Encounter for screening for other disorder: Secondary | ICD-10-CM | POA: Diagnosis not present

## 2015-08-19 DIAGNOSIS — J029 Acute pharyngitis, unspecified: Secondary | ICD-10-CM | POA: Diagnosis not present

## 2015-08-19 DIAGNOSIS — Z6837 Body mass index (BMI) 37.0-37.9, adult: Secondary | ICD-10-CM | POA: Diagnosis not present

## 2015-08-23 HISTORY — PX: BUNIONECTOMY: SHX129

## 2015-08-29 DIAGNOSIS — R3914 Feeling of incomplete bladder emptying: Secondary | ICD-10-CM | POA: Diagnosis not present

## 2015-08-29 DIAGNOSIS — N390 Urinary tract infection, site not specified: Secondary | ICD-10-CM | POA: Diagnosis not present

## 2015-09-07 ENCOUNTER — Ambulatory Visit (INDEPENDENT_AMBULATORY_CARE_PROVIDER_SITE_OTHER): Payer: BLUE CROSS/BLUE SHIELD | Admitting: Internal Medicine

## 2015-09-07 ENCOUNTER — Encounter (INDEPENDENT_AMBULATORY_CARE_PROVIDER_SITE_OTHER): Payer: Self-pay

## 2015-09-07 ENCOUNTER — Encounter (INDEPENDENT_AMBULATORY_CARE_PROVIDER_SITE_OTHER): Payer: Self-pay | Admitting: Internal Medicine

## 2015-09-07 ENCOUNTER — Encounter (INDEPENDENT_AMBULATORY_CARE_PROVIDER_SITE_OTHER): Payer: Self-pay | Admitting: *Deleted

## 2015-09-07 VITALS — BP 100/62 | HR 80 | Temp 98.6°F | Ht 65.0 in | Wt 221.2 lb

## 2015-09-07 DIAGNOSIS — R748 Abnormal levels of other serum enzymes: Secondary | ICD-10-CM

## 2015-09-07 MED ORDER — OMEPRAZOLE 40 MG PO CPDR: 40.0000 mg | DELAYED_RELEASE_CAPSULE | Freq: Two times a day (BID) | ORAL | 3 refills | Status: DC

## 2015-09-07 NOTE — Patient Instructions (Signed)
OV in 6 months. 

## 2015-09-07 NOTE — Progress Notes (Signed)
Subjective:    Patient ID: Debbie Pineda, female    DOB: 1960/04/20, 55 y.o.   MRN: [id number]  HPI Here today for f/u of her elevated liver enzymes. She was seen in February of this year and repeat liver enzymes were normal. She had stopped the Diclofenac. Hx of elevated liver enzymes dating back to 2014. [place]  03/27/2013 revealed: Liver:  Minimally echogenic question fatty infiltration. No focal mass or nodularity.  Hepatopetal portal venous flow.  She says she has constipation and nausea.  When she eats she has nausea. She has lost 12 pounds since her visit in February of this year. (Intentional). She is eating in small amt. Hx of gastric by pass in 3-4 years. She says she has nausea for about 2 months. Gradually worsened.  She has a BM every day (2-3 times) or every 3rd day.     Recently underwent a knee replacement in March of this year.  Last colonscopy by Dr. Laural Golden (screening) and was normal.       Hepatic Function Latest Ref Rng & Units 03/21/2015 03/15/2015 01/30/2013  Total Protein 6.1 - 8.1 g/dL 6.6 6.0(L) 6.4  Albumin 3.6 - 5.1 g/dL 4.1 3.5 4.0  AST 10 - 35 U/L 21 96(H) 17  ALT 6 - 29 U/L 25 75(H) 26  Alk Phosphatase 33 - 130 U/L 83 101 100  Total Bilirubin 0.2 - 1.2 mg/dL 0.4 0.5 0.3  Bilirubin, Direct <=0.2 mg/dL 0.1 - -       Review of Systems Past Medical History:  Diagnosis Date  . ADD (attention deficit disorder)    takes Adderall daily  . Arthritis    "all over"  . Chronic back pain    "all over"  . Constipation    takes stool softener daily  . Degenerative disk disease   . Degenerative joint disease   . Depression    takes Cymbalta daily for pain per pt  . DVT (deep venous thrombosis) (HCC)    RLE  . Family history of adverse reaction to anesthesia    a family member woke up during surgery; "think it was my mom"  . Fibromyalgia   . GERD (gastroesophageal reflux disease)   . History of blood clots    superficial  . Hyperglycemia   . Hypothyroid    takes Synthroid daily  . Incomplete emptying of bladder   . Insomnia    takes Trazodone nightly  . Iron deficiency anemia    takes Ferrous Sulfate daily  . Joint pain   . Joint swelling    knees and ankles  . Morbid obesity ([place])   . Nausea    takes Zofran as needed.Seeing GI doc  . OSA on CPAP   . Osteoarthritis   . PFO (patent foramen ovale)   . Scoliosis   . Stroke [facility]) "several"   right foot weakness; memory issues, black spot right visual field since" (03/23/2015)  . Thrombophlebitis     Past Surgical History:  Procedure Laterality Date  . COLONOSCOPY N/A 03/25/2013   Procedure: COLONOSCOPY;  Surgeon: Rogene Houston, MD;  Location: [facility];  Service: Endoscopy;  Laterality: N/A;  930  . ESOPHAGOGASTRODUODENOSCOPY    . EXPLORATORY LAPAROTOMY     "took fallopian tubes out"  . JOINT REPLACEMENT    . KNEE ARTHROSCOPY Left   . KNEE ARTHROSCOPY W/ ACL RECONSTRUCTION Right    "added pins"  . LAPAROSCOPIC CHOLECYSTECTOMY  ~ 2001  . 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 TOTAL KNEE ARTHROPLASTY Left 03/23/2015  . TOTAL KNEE ARTHROPLASTY Left 03/23/2015   Procedure: TOTAL KNEE ARTHROPLASTY;  Surgeon: Newt Minion, MD;  Location: [place];  Service: Orthopedics;  Laterality: Left;  Marland Kitchen VAGINAL HYSTERECTOMY    . VARICOSE VEIN SURGERY Right X 2    Allergies  Allergen Reactions  . Flexeril [Cyclobenzaprine Hcl] Other (See Comments)    Numbness of extremities  . Morphine And Related Itching    Upper torso  . Septra [Bactrim] Itching and Rash    Upper torso  . Sulfa Antibiotics Itching and Rash    Itching was upper torso  . Sulfamethoxazole-Trimethoprim Itching and Rash    Bactrim  . Tape Itching and Rash    Please use "paper" tape    Current Outpatient Prescriptions on File Prior to Visit  Medication Sig Dispense Refill  . amphetamine-dextroamphetamine (ADDERALL XR) 30 MG 24 hr capsule Take 30 mg by mouth daily.   0  . amphetamine-dextroamphetamine (ADDERALL) 30 MG  tablet Take 15 mg by mouth daily as needed (for tiredness).   0  . Calcium Carbonate-Vitamin D3 (CALCIUM 600-D) 600-400 MG-UNIT TABS Take 1 tablet by mouth 2 (two) times daily. Reported on 03/21/2015    . Cyanocobalamin (CVS B-12) 1500 MCG TBDP Place 5,000 mcg under the tongue daily. Reported on 03/21/2015    . diclofenac sodium (VOLTAREN) 1 % GEL Apply topically 4 (four) times daily. Reported on 03/21/2015    . Diclofenac-Misoprostol 50-0.2 MG TBEC Take 1 tablet by mouth 2 (two) times daily. Reported on 03/21/2015  1  . docusate sodium (COLACE) 100 MG capsule Take 250 mg by mouth 2 (two) times daily. Reported on 03/21/2015    . DULoxetine (CYMBALTA) 60 MG capsule Take 60 mg by mouth 2 (two) times daily.  11  . fentaNYL (DURAGESIC - DOSED MCG/HR) 75 MCG/HR Place 25 mcg onto the skin every 3 (three) days. Reported on 03/21/2015    . Ferrous Sulfate 140 (45 Fe) MG TBCR Take 45 mg by mouth daily.     . furosemide (LASIX) 20 MG tablet Take 20 mg by mouth as needed. Reported on 03/21/2015    . gabapentin (NEURONTIN) 300 MG capsule Take 300 mg by mouth 4 (four) times daily.     Marland Kitchen ibuprofen (ADVIL,MOTRIN) 200 MG tablet Take 600 mg by mouth 2 (two) times daily as needed for headache, mild pain or moderate pain. Reported on 03/21/2015    . Krill Oil 300 MG CAPS Take 300 mg by mouth at bedtime. Reported on 03/21/2015    . levothyroxine (SYNTHROID, LEVOTHROID) 100 MCG tablet 125 mcg.     . methocarbamol (ROBAXIN) 750 MG tablet Take 750 mg by mouth 3 (three) times daily.    Marland Kitchen MOVANTIK 25 MG TABS tablet Take 25 mg by mouth daily as needed (for constipation). Reported on 03/21/2015    . Multiple Vitamins-Iron (MULTIVITAMIN/IRON PO) Take 1 tablet by mouth 2 (two) times daily. Reported on 03/21/2015    . omeprazole (PRILOSEC) 40 MG capsule TAKE ONE CAPSULE BY MOUTH ONCE DAILY 30 capsule 0  . ondansetron (ZOFRAN-ODT) 4 MG disintegrating tablet Take 4 mg by mouth every 6 (six) hours as needed for nausea.   2  . Oxycodone HCl  10 MG TABS TK 1 T PO Q 4-6 H PRN  0  . Phosphatidylserine-DHA-EPA (VAYACOG) 100-19.5-6.5 MG CAPS Take 1 capsule by mouth daily. Reported on 03/21/2015    . pseudoephedrine (SUDAFED) 30 MG tablet Take 30-60 mg  by mouth 3 (three) times daily as needed for congestion. Reported on 03/21/2015    . rivaroxaban (XARELTO) 20 MG TABS tablet Take 1 tablet (20 mg total) by mouth daily with supper. 30 tablet 1  . simethicone (MYLICON) 80 MG chewable tablet Chew 80 mg by mouth as needed. Reported on 03/21/2015    . topiramate (TOPAMAX) 100 MG tablet Take 150 mg by mouth at bedtime.     . traZODone (DESYREL) 50 MG tablet Take 100 mg by mouth at bedtime as needed for sleep.     . Vitamin D, Ergocalciferol, (DRISDOL) 50000 UNITS CAPS Take 50,000 Units by mouth every 7 (seven) days. Reported on 03/21/2015    . XARELTO 20 MG TABS tablet Take 20 mg by mouth daily with supper. Reported on 03/21/2015  1   No current facility-administered medications on file prior to visit.        Objective:   Physical Exam  Blood pressure 100/62, pulse 80, temperature 98.6 F (37 C), height '5\' 5"'  (1.651 m), weight 221 lb 3.2 oz (100.3 kg).  Alert and oriented. Skin warm and dry. Oral mucosa is moist.   . Sclera anicteric, conjunctivae is pink. Thyroid not enlarged. No cervical lymphadenopathy. Lungs clear. Heart regular rate and rhythm.  Abdomen is soft. Bowel sounds are positive. No hepatomegaly. No abdominal masses felt. Slight epigastric tenderness.  No edema to lower extremities.        Assessment & Plan:  Hx of elevated enzymes: Last enzymes in February were normal.   Needs US abdomen, hepatic function.  Constipation narcotic induced. Continue the Movantik as needed and Colace.  Nausea: Omeprazole 80m BID. OV in 6 months.

## 2015-09-08 DIAGNOSIS — M5136 Other intervertebral disc degeneration, lumbar region: Secondary | ICD-10-CM | POA: Diagnosis not present

## 2015-09-08 DIAGNOSIS — Z1389 Encounter for screening for other disorder: Secondary | ICD-10-CM | POA: Diagnosis not present

## 2015-09-08 DIAGNOSIS — Z6836 Body mass index (BMI) 36.0-36.9, adult: Secondary | ICD-10-CM | POA: Diagnosis not present

## 2015-09-08 DIAGNOSIS — M797 Fibromyalgia: Secondary | ICD-10-CM | POA: Diagnosis not present

## 2015-09-08 DIAGNOSIS — M419 Scoliosis, unspecified: Secondary | ICD-10-CM | POA: Diagnosis not present

## 2015-09-08 LAB — HEPATIC FUNCTION PANEL
ALT: 16 U/L (ref 6–29)
AST: 14 U/L (ref 10–35)
Albumin: 4 g/dL (ref 3.6–5.1)
Alkaline Phosphatase: 89 U/L (ref 33–130)
Bilirubin, Direct: 0.1 mg/dL (ref <=0.2)
Indirect Bilirubin: 0.4 mg/dL (ref 0.2–1.2)
Total Bilirubin: 0.5 mg/dL (ref 0.2–1.2)
Total Protein: 6.6 g/dL (ref 6.1–8.1)

## 2015-09-12 ENCOUNTER — Ambulatory Visit (HOSPITAL_COMMUNITY)
Admission: RE | Admit: 2015-09-12 | Discharge: 2015-09-12 | Disposition: A | Discharge status: Home / Self Care | Payer: BLUE CROSS/BLUE SHIELD | Source: Ambulatory Visit | Attending: Internal Medicine | Admitting: Internal Medicine

## 2015-09-12 DIAGNOSIS — R932 Abnormal findings on diagnostic imaging of liver and biliary tract: Secondary | ICD-10-CM | POA: Insufficient documentation

## 2015-09-12 DIAGNOSIS — R748 Abnormal levels of other serum enzymes: Secondary | ICD-10-CM | POA: Insufficient documentation

## 2015-09-12 DIAGNOSIS — Z9049 Acquired absence of other specified parts of digestive tract: Secondary | ICD-10-CM | POA: Insufficient documentation

## 2015-09-12 DIAGNOSIS — R160 Hepatomegaly, not elsewhere classified: Secondary | ICD-10-CM | POA: Insufficient documentation

## 2015-09-29 DIAGNOSIS — N3000 Acute cystitis without hematuria: Secondary | ICD-10-CM | POA: Diagnosis not present

## 2015-09-29 DIAGNOSIS — R109 Unspecified abdominal pain: Secondary | ICD-10-CM | POA: Diagnosis not present

## 2015-09-29 DIAGNOSIS — Z87440 Personal history of urinary (tract) infections: Secondary | ICD-10-CM | POA: Diagnosis not present

## 2015-09-29 DIAGNOSIS — Z9884 Bariatric surgery status: Secondary | ICD-10-CM | POA: Diagnosis not present

## 2015-10-05 ENCOUNTER — Encounter (INDEPENDENT_AMBULATORY_CARE_PROVIDER_SITE_OTHER): Payer: Self-pay

## 2015-10-07 DIAGNOSIS — Z1389 Encounter for screening for other disorder: Secondary | ICD-10-CM | POA: Diagnosis not present

## 2015-10-07 DIAGNOSIS — K5909 Other constipation: Secondary | ICD-10-CM | POA: Diagnosis not present

## 2015-10-07 DIAGNOSIS — M5136 Other intervertebral disc degeneration, lumbar region: Secondary | ICD-10-CM | POA: Diagnosis not present

## 2015-10-07 DIAGNOSIS — Z6836 Body mass index (BMI) 36.0-36.9, adult: Secondary | ICD-10-CM | POA: Diagnosis not present

## 2015-10-25 DIAGNOSIS — Z0001 Encounter for general adult medical examination with abnormal findings: Secondary | ICD-10-CM | POA: Diagnosis not present

## 2015-10-25 DIAGNOSIS — E669 Obesity, unspecified: Secondary | ICD-10-CM | POA: Diagnosis not present

## 2015-10-25 DIAGNOSIS — E6609 Other obesity due to excess calories: Secondary | ICD-10-CM | POA: Diagnosis not present

## 2015-10-25 DIAGNOSIS — E063 Autoimmune thyroiditis: Secondary | ICD-10-CM | POA: Diagnosis not present

## 2015-10-25 DIAGNOSIS — Z9884 Bariatric surgery status: Secondary | ICD-10-CM | POA: Diagnosis not present

## 2015-10-25 DIAGNOSIS — Z6836 Body mass index (BMI) 36.0-36.9, adult: Secondary | ICD-10-CM | POA: Diagnosis not present

## 2015-11-10 ENCOUNTER — Ambulatory Visit (INDEPENDENT_AMBULATORY_CARE_PROVIDER_SITE_OTHER): Payer: BLUE CROSS/BLUE SHIELD | Admitting: Specialist

## 2015-11-10 DIAGNOSIS — M5136 Other intervertebral disc degeneration, lumbar region: Secondary | ICD-10-CM | POA: Diagnosis not present

## 2015-11-10 DIAGNOSIS — M47816 Spondylosis without myelopathy or radiculopathy, lumbar region: Secondary | ICD-10-CM

## 2015-11-22 ENCOUNTER — Encounter (INDEPENDENT_AMBULATORY_CARE_PROVIDER_SITE_OTHER): Payer: Self-pay | Admitting: Physical Medicine and Rehabilitation

## 2015-11-22 ENCOUNTER — Ambulatory Visit (INDEPENDENT_AMBULATORY_CARE_PROVIDER_SITE_OTHER): Payer: BLUE CROSS/BLUE SHIELD | Admitting: Physical Medicine and Rehabilitation

## 2015-11-22 VITALS — BP 106/70 | HR 64 | Temp 99.0°F

## 2015-11-22 DIAGNOSIS — M47816 Spondylosis without myelopathy or radiculopathy, lumbar region: Secondary | ICD-10-CM | POA: Diagnosis not present

## 2015-11-22 DIAGNOSIS — M545 Low back pain, unspecified: Secondary | ICD-10-CM

## 2015-11-22 DIAGNOSIS — G8929 Other chronic pain: Secondary | ICD-10-CM

## 2015-11-22 MED ORDER — METHYLPREDNISOLONE ACETATE 80 MG/ML IJ SUSP
80.0000 mg | Freq: Once | INTRAMUSCULAR | Status: AC
  Administered 2015-11-23: 80 mg

## 2015-11-22 MED ORDER — LIDOCAINE HCL (PF) 1 % IJ SOLN: 0.3300 mL | Freq: Once | INTRAMUSCULAR | Status: DC

## 2015-11-22 MED ORDER — BUPIVACAINE HCL 0.25 % IJ SOLN
3.0000 mL | Freq: Once | INTRAMUSCULAR | Status: AC
  Administered 2015-11-22: 3 mL

## 2015-11-22 NOTE — Patient Instructions (Addendum)
Pain Diary:     Percent Relief   0% 25% 50% 75% 100%  Post       1 hour       2 hour       3 hour       4 hour           Development worker, community Discharge Instructions  *At any time if you have questions or concerns they can be answered by calling [phone]  All Patients: . You may experience an increase in your symptoms for the first 2 days (it can take 2 days to 2 weeks for the steroid/cortisone to have its maximal effect). . You may use ice to the site for the first 24 hours; 20 minutes on and 20 minutes off and may use heat after that time. . You may resume and continue your current pain medications. If you need a refill please contact the prescribing physician. . You may resume her regular medications if any were stopped for the procedure. . You may shower but no swimming, tub bath or Jacuzzi for 24 hours. . Please remove bandage after 4 hours. . You may resume light activities as tolerated. . You should not drive for the next 3 hours due to anesthetics used in the procedure. Please have someone drive for you.  *If you have had sedation, Valium, Xanax, or lorazepam: Do not drive or use public transportation for 24 hours, do not operating hazardous machinery or make important personal/business decisions for 24 hours.  POSSIBLE STEROID SIDE EFFECTS: If experienced these should only last for a short period. Change in menstrual flow  Edema in (swelling)  Increased appetite Skin flushing (redness)  Skin rash/acne  Thrush (oral) Vaginitis    Increased sweating  Depression Increased blood glucose levels Cramping and leg/calf  Euphoria (feeling happy)  POSSIBLE PROCEDURE SIDE EFFECTS: Please call our office if concerned. Increased pain Increased numbness/tingling  Headache Nausea/vomiting Hematoma (bruising/bleeding) Edema (swelling at the site) Weakness  Infection (red/drainage at site) Fever greater than 100.89F  *In the event of a headache after epidural steroid  injection: Drink plenty of fluids, especially water and try to lay flat when possible. If the headache does not get better after a few days or as always if concerned please call the office.

## 2015-11-22 NOTE — Procedures (Signed)
Lumbar Facet Joint Intra-Articular Injection(s) with Fluoroscopic Guidance  Patient: Debbie Pineda      Date of Birth: 1960/10/18 MRN: [id number] PCP: Jana Half      Visit Date: 11/22/2015   Universal Protocol:    Date/Time: 10/31/173:23 PM  Consent Given By: the patient  Position: PRONE   Additional Comments: Vital signs were monitored before and after the procedure. Patient was prepped and draped in the usual sterile fashion. The correct patient, procedure, and site was verified.   Injection Procedure Details:  Procedure Site One Meds Administered:  Meds ordered this encounter  Medications  . lidocaine (PF) (XYLOCAINE) 1 % injection 0.3 mL  . bupivacaine (MARCAINE) 0.25 % (with pres) injection 3 mL  . methylPREDNISolone acetate (DEPO-MEDROL) injection 80 mg     Laterality: Left  Location/Site:  L2-L3 L3-L4 L4-L5  Needle size: 22 guage  Needle type: Spinal  Needle Placement: Articular  Findings:  -Contrast Used: 1 mL iohexol 180 mg iodine/mL   -Comments: Excellent flow of contrast producing a partial arthrogram.  Procedure Details: The fluoroscope beam is vertically oriented in AP, and the inferior recess is visualized beneath the lower pole of the inferior apophyseal process, which represents the target point for needle insertion. When direct visualization is difficult the target point is located at the medial projection of the vertebral pedicle. The region overlying each aforementioned target is locally anesthetized with a 1 to 2 ml. volume of 1% Lidocaine without Epinephrine.   The spinal needle was inserted into each of the above mentioned facet joints using biplanar fluoroscopic guidance. A 0.25 to 0.5 ml. volume of Isovue-250 was injected and a partial facet joint arthrogram was obtained. A single spot film was obtained of the resulting arthrogram.    One to 1.25 ml of the steroid/anesthetic solution was then injected into each of the facet  joints noted above.   Additional Comments:  The patient tolerated the procedure well No complications occurred Dressing: Band-Aid    Post-procedure details: Patient was observed during the procedure. Post-procedure instructions were reviewed.  Patient left the clinic in stable condition.

## 2015-11-22 NOTE — Progress Notes (Signed)
Office Visit Note  Patient: Debbie Pineda           Date of Birth: 24-Jan-1960           MRN: [id number] Visit Date: 11/22/2015              Requested by: Jake Samples, PA-C [address] [place], [place] [address] PCP: Delman Cheadle, PA-C   Assessment & Plan: Visit Diagnoses:  1. Spondylosis without myelopathy or radiculopathy, lumbar region   2. Chronic left-sided low back pain without sciatica    We are going to complete a diagnostic and hopefully therapeutic left L2-3 and L3-4 and L4-5 facet joint block.  Follow-Up Instructions: Return Dr. Louanne Skye if symptoms worsen or fail to improve, for Recheck spine.  Orders:  Orders Placed This Encounter  Procedures  . Nerve Block    Meds ordered this encounter  Medications  . lidocaine (PF) (XYLOCAINE) 1 % injection 0.3 mL  . bupivacaine (MARCAINE) 0.25 % (with pres) injection 3 mL  . methylPREDNISolone acetate (DEPO-MEDROL) injection 80 mg      Procedures: Lumbar Facet Joint Intra-Articular Injection(s) with Fluoroscopic Guidance  Patient: Debbie Pineda      Date of Birth: Jun 28, 1960 MRN: [id number] PCP: Jana Half      Visit Date: 11/22/2015   Universal Protocol:    Date/Time: 10/31/173:23 PM  Consent Given By: the patient  Position: PRONE   Additional Comments: Vital signs were monitored before and after the procedure. Patient was prepped and draped in the usual sterile fashion. The correct patient, procedure, and site was verified.   Injection Procedure Details:  Procedure Site One Meds Administered:  Meds ordered this encounter  Medications  . lidocaine (PF) (XYLOCAINE) 1 % injection 0.3 mL  . bupivacaine (MARCAINE) 0.25 % (with pres) injection 3 mL  . methylPREDNISolone acetate (DEPO-MEDROL) injection 80 mg     Laterality: Left  Location/Site:  L2-L3 L3-L4 L4-L5  Needle size: 22 guage  Needle type: Spinal  Needle Placement: Articular  Findings:  -Contrast  Used: 1 mL iohexol 180 mg iodine/mL   -Comments: Excellent flow of contrast producing a partial arthrogram.  Procedure Details: The fluoroscope beam is vertically oriented in AP, and the inferior recess is visualized beneath the lower pole of the inferior apophyseal process, which represents the target point for needle insertion. When direct visualization is difficult the target point is located at the medial projection of the vertebral pedicle. The region overlying each aforementioned target is locally anesthetized with a 1 to 2 ml. volume of 1% Lidocaine without Epinephrine.   The spinal needle was inserted into each of the above mentioned facet joints using biplanar fluoroscopic guidance. A 0.25 to 0.5 ml. volume of Isovue-250 was injected and a partial facet joint arthrogram was obtained. A single spot film was obtained of the resulting arthrogram.    One to 1.25 ml of the steroid/anesthetic solution was then injected into each of the facet joints noted above.   Additional Comments:  The patient tolerated the procedure well No complications occurred Dressing: Band-Aid    Post-procedure details: Patient was observed during the procedure. Post-procedure instructions were reviewed.  Patient left the clinic in stable condition.         Other Procedures: No procedures performed   Clinical Data: No additional findings.   Subjective: Chief Complaint  Patient presents with  . Lower Back - Pain  . Middle Back - Pain    HPIMid to lower back pain x several  years. Persistent pain since March. Has pain in legs but doesn't know if it is coming from back or due to her fibromyalgia. Bilateral leg weakness more on right side but states she has has several stokes so not sure if back related or not. Pain worse when she first gets up.Her pain is predominantly left-sided. She has had a complicated history of chronic pain and fibromyalgia. She was seen by Dr. Patrecia Pour by Dr. Louanne Skye. She has  been in chronic pain management in the past. She is on multiple pain medications and nerve stabilizing medications. She is a retired Marine scientist.  Taking oxycodone and tramadol and using voltaren gel  No dye allergy   Review of Systems   Objective: Vital Signs: BP 106/70 (BP Location: Left Arm, Patient Position: Sitting, Cuff Size: Normal)   Pulse 64   Temp 99 F (37.2 C) (Oral)   SpO2 96%    Physical Exam  Ortho Exam  Specialty Comments:  No specialty comments available. Imaging: No results found.   PMFS History: Patient Active Problem List   Diagnosis Date Noted  . 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 Elevated LFTs 03/17/2012  . CVA (cerebral infarction) 10/30/2011  . PFO (patent foramen ovale) 10/30/2011  . Bradycardia 10/30/2011  . Chest pain 10/30/2011  . Hypothyroid   . Thrombophlebitis   . Sleep apnea   . Fibromyalgia   . Degenerative joint disease   . 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 Morbid obesity ([place]) 11/10/2010   Past Medical History:  Diagnosis Date  . ADD (attention deficit disorder)    takes Adderall daily  . Arthritis    "all over"  . Chronic back pain    "all over"  . Constipation    takes stool softener daily  . Degenerative disk disease   . Degenerative joint disease   . Depression    takes Cymbalta daily for pain per pt  . DVT (deep venous thrombosis) ([facility])    RLE  . Family history of adverse reaction to anesthesia    a family member woke up during surgery; "think it was my mom"  . Fibromyalgia   . GERD (gastroesophageal reflux disease)   . History of blood clots    superficial  . Hyperglycemia   . Hypothyroid    takes Synthroid daily  . Incomplete emptying of bladder   . Insomnia    takes Trazodone nightly  . Iron deficiency anemia    takes Ferrous Sulfate daily  . Joint pain   . Joint swelling    knees and ankles  . Morbid obesity ([place])   . Nausea    takes Zofran as needed.Seeing GI doc  . OSA on  CPAP   . Osteoarthritis   . PFO (patent foramen ovale)   . Scoliosis   . Stroke [facility]) "several"   right foot weakness; memory issues, black spot right visual field since" (03/23/2015)  . Thrombophlebitis     Family History  Problem Relation Age of Onset  . Cancer Father     brain  . Heart disease Father   . Heart disease Brother   . Colon cancer Neg Hx    Past Surgical History:  Procedure Laterality Date  . COLONOSCOPY N/A 03/25/2013   Procedure: COLONOSCOPY;  Surgeon: Rogene Houston, MD;  [facility];  Service: Endoscopy;  Laterality: N/A;  930  . ESOPHAGOGASTRODUODENOSCOPY    . EXPLORATORY LAPAROTOMY     "took fallopian tubes out"  .  JOINT REPLACEMENT    . KNEE ARTHROSCOPY Left   . KNEE ARTHROSCOPY W/ ACL RECONSTRUCTION Right    "added pins"  . LAPAROSCOPIC CHOLECYSTECTOMY  ~ 2001  . 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 TOTAL KNEE ARTHROPLASTY Left 03/23/2015  . TOTAL KNEE ARTHROPLASTY Left 03/23/2015   Procedure: TOTAL KNEE ARTHROPLASTY;  Surgeon: Newt Minion, MD;  Location: [facility];  Service: Orthopedics;  Laterality: Left;  Marland Kitchen VAGINAL HYSTERECTOMY    . VARICOSE VEIN SURGERY Right X 2   Social History   Occupational History  . Disability   . formerly Therapist, sports, [place] History Main Topics  . Smoking status: Former Smoker    Packs/day: 1.00    Years: 8.00    Types: Cigarettes  . Smokeless tobacco: Never Used     Comment: quit smoking in the 1990s  . Alcohol use Yes     Comment: 03/23/2015 "stopped drinking in 2012 w/gastric bypass; drank socially before bypass"  . Drug use: No  . Sexual activity: Not Currently    Birth control/ protection: Surgical

## 2015-11-23 DIAGNOSIS — G8929 Other chronic pain: Secondary | ICD-10-CM | POA: Diagnosis not present

## 2015-11-23 DIAGNOSIS — M47816 Spondylosis without myelopathy or radiculopathy, lumbar region: Secondary | ICD-10-CM | POA: Diagnosis not present

## 2015-11-23 DIAGNOSIS — M545 Low back pain: Secondary | ICD-10-CM | POA: Diagnosis not present

## 2015-11-29 DIAGNOSIS — F329 Major depressive disorder, single episode, unspecified: Secondary | ICD-10-CM | POA: Insufficient documentation

## 2015-11-29 DIAGNOSIS — F32A Depression, unspecified: Secondary | ICD-10-CM | POA: Insufficient documentation

## 2015-12-01 ENCOUNTER — Ambulatory Visit: Payer: BLUE CROSS/BLUE SHIELD | Admitting: Rheumatology

## 2015-12-01 ENCOUNTER — Ambulatory Visit (INDEPENDENT_AMBULATORY_CARE_PROVIDER_SITE_OTHER): Payer: BLUE CROSS/BLUE SHIELD | Admitting: Rheumatology

## 2015-12-01 ENCOUNTER — Encounter: Payer: Self-pay | Admitting: Rheumatology

## 2015-12-01 VITALS — BP 129/72 | HR 58 | Resp 14 | Ht 65.0 in | Wt 219.0 lb

## 2015-12-01 DIAGNOSIS — M542 Cervicalgia: Secondary | ICD-10-CM | POA: Diagnosis not present

## 2015-12-01 DIAGNOSIS — Z9889 Other specified postprocedural states: Secondary | ICD-10-CM

## 2015-12-01 DIAGNOSIS — I10 Essential (primary) hypertension: Secondary | ICD-10-CM

## 2015-12-01 DIAGNOSIS — G8929 Other chronic pain: Secondary | ICD-10-CM

## 2015-12-01 DIAGNOSIS — M7062 Trochanteric bursitis, left hip: Secondary | ICD-10-CM | POA: Diagnosis not present

## 2015-12-01 DIAGNOSIS — E039 Hypothyroidism, unspecified: Secondary | ICD-10-CM | POA: Diagnosis not present

## 2015-12-01 DIAGNOSIS — G4730 Sleep apnea, unspecified: Secondary | ICD-10-CM | POA: Diagnosis not present

## 2015-12-01 DIAGNOSIS — Z9884 Bariatric surgery status: Secondary | ICD-10-CM

## 2015-12-01 DIAGNOSIS — M170 Bilateral primary osteoarthritis of knee: Secondary | ICD-10-CM | POA: Diagnosis not present

## 2015-12-01 DIAGNOSIS — M19072 Primary osteoarthritis, left ankle and foot: Secondary | ICD-10-CM

## 2015-12-01 DIAGNOSIS — M19071 Primary osteoarthritis, right ankle and foot: Secondary | ICD-10-CM | POA: Diagnosis not present

## 2015-12-01 DIAGNOSIS — I639 Cerebral infarction, unspecified: Secondary | ICD-10-CM

## 2015-12-01 DIAGNOSIS — M7061 Trochanteric bursitis, right hip: Secondary | ICD-10-CM

## 2015-12-01 DIAGNOSIS — M797 Fibromyalgia: Secondary | ICD-10-CM | POA: Diagnosis not present

## 2015-12-01 DIAGNOSIS — M545 Low back pain: Secondary | ICD-10-CM

## 2015-12-01 DIAGNOSIS — M47816 Spondylosis without myelopathy or radiculopathy, lumbar region: Secondary | ICD-10-CM

## 2015-12-01 MED ORDER — TOPIRAMATE 100 MG PO TABS: 150.0000 mg | ORAL_TABLET | Freq: Every day | ORAL | 0 refills | Status: DC

## 2015-12-01 MED ORDER — TRIAMCINOLONE ACETONIDE 40 MG/ML IJ SUSP
10.0000 mg | INTRAMUSCULAR | Status: AC | PRN
  Administered 2015-12-01: 10 mg via INTRAMUSCULAR

## 2015-12-01 MED ORDER — METHOCARBAMOL 750 MG PO TABS: 750.0000 mg | ORAL_TABLET | Freq: Three times a day (TID) | ORAL | 2 refills | Status: DC

## 2015-12-01 MED ORDER — LIDOCAINE HCL 1 % IJ SOLN
0.5000 mL | INTRAMUSCULAR | Status: AC | PRN
  Administered 2015-12-01: .5 mL

## 2015-12-01 NOTE — Progress Notes (Signed)
*IMAGE* Office Visit Note  Patient: Debbie Pineda             Date of Birth: 07-02-1960           MRN: [id number]             PCP: Jana Half Referring: Jake Samples, PA* Visit Date: 12/01/2015 Occupation:Disability    Subjective:  Generalized pain   History of Present Illness: Debbie Pineda is a 55 y.o. female with history of fibromyalgia, disc disease and osteoarthritis. She states that she's been having increased pain lately. She has had a lot of lower back discomfort for which she had 3 cortisone injections to her sacral region by Dr. Ernestina Patches. She still continues to have some lower back discomfort. She's been having discomfort in the neck and trapezius area. And would like to have a cortisone injection to her right trapezius. She has pain in her bilateral lower extremities and her ankles. She had left total knee replacement in March 2017 by Dr. Sharol Given which is doing fairly well and right bunionectomy by Dr. Sharol Given in August 2017.  Activities of Daily Living:  Patient reports morning stiffness for 30 minutes.   Patient Reports nocturnal pain.  Difficulty dressing/grooming: Reports Difficulty climbing stairs: Reports Difficulty getting out of chair: Reports Difficulty using hands for taps, buttons, cutlery, and/or writing: Reports   Review of Systems  Constitutional: Positive for fatigue and weakness. Negative for night sweats, weight gain and weight loss.  HENT: Positive for mouth dryness. Negative for mouth sores, trouble swallowing, trouble swallowing and nose dryness.   Eyes: Negative for pain, redness, visual disturbance and dryness.  Respiratory: Negative for cough, shortness of breath and difficulty breathing.   Cardiovascular: Negative for chest pain, palpitations, hypertension, irregular heartbeat and swelling in legs/feet.  Gastrointestinal: Positive for constipation. Negative for blood in stool and diarrhea.  Endocrine: Negative for increased  urination.  Genitourinary: Negative for vaginal dryness.  Musculoskeletal: Positive for arthralgias, joint pain, myalgias, morning stiffness and myalgias. Negative for joint swelling, muscle weakness and muscle tenderness.  Skin: Negative for color change, rash, hair loss, skin tightness, ulcers and sensitivity to sunlight.  Allergic/Immunologic: Negative for susceptible to infections.  Neurological: Positive for headaches. Negative for dizziness, memory loss and night sweats.  Hematological: Negative for swollen glands.  Psychiatric/Behavioral: Positive for sleep disturbance. Negative for depressed mood. The patient is not nervous/anxious.     PMFS History:  Patient Active Problem List   Diagnosis Date Noted  . Depression 11/29/2015  . 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 Elevated LFTs 03/17/2012  . Cerebral infarction ([place]) 10/30/2011  . PFO (patent foramen ovale) 10/30/2011  . Bradycardia 10/30/2011  . Chest pain 10/30/2011  . Hypothyroid   . Thrombophlebitis   . Sleep apnea   . Fibromyalgia   . Degenerative joint disease   . 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 Morbid obesity ([place]) 11/10/2010    Past Medical History:  Diagnosis Date  . ADD (attention deficit disorder)    takes Adderall daily  . Arthritis    "all over"  . Chronic back pain    "all over"  . Constipation    takes stool softener daily  . Degenerative disk disease   . Degenerative joint disease   . Depression    takes Cymbalta daily for pain per pt  . DVT (deep venous thrombosis) ([facility])    RLE  . Family history of adverse reaction to anesthesia    a  family member woke up during surgery; "think it was my mom"  . Fibromyalgia   . GERD (gastroesophageal reflux disease)   . History of blood clots    superficial  . Hyperglycemia   . Hypothyroid    takes Synthroid daily  . Incomplete emptying of bladder   . Insomnia    takes Trazodone nightly  . Iron deficiency anemia    takes  Ferrous Sulfate daily  . Joint pain   . Joint swelling    knees and ankles  . Morbid obesity ([place])   . Nausea    takes Zofran as needed.Seeing GI doc  . OSA on CPAP   . Osteoarthritis   . PFO (patent foramen ovale)   . Scoliosis   . Stroke [facility]) "several"   right foot weakness; memory issues, black spot right visual field since" (03/23/2015)  . Thrombophlebitis     Family History  Problem Relation Age of Onset  . Heart disease Father   . Cancer Father   . Heart disease Brother   . Heart disease Sister   . Cancer Maternal Grandfather   . Colon cancer Neg Hx    Past Surgical History:  Procedure Laterality Date  . BUNIONECTOMY Right 08/2015  . COLONOSCOPY N/A 03/25/2013   Procedure: COLONOSCOPY;  Surgeon: Rogene Houston, MD;  [facility];  Service: Endoscopy;  Laterality: N/A;  930  . ESOPHAGOGASTRODUODENOSCOPY    . EXPLORATORY LAPAROTOMY     "took fallopian tubes out"  . JOINT REPLACEMENT    . KNEE ARTHROSCOPY Left   . KNEE ARTHROSCOPY W/ ACL RECONSTRUCTION Right    "added pins"  . LAPAROSCOPIC CHOLECYSTECTOMY  ~ 2001  . 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 TOTAL KNEE ARTHROPLASTY Left 03/23/2015  . TOTAL KNEE ARTHROPLASTY Left 03/23/2015   Procedure: TOTAL KNEE ARTHROPLASTY;  Surgeon: Newt Minion, MD;  Location: [facility];  Service: Orthopedics;  Laterality: Left;  Marland Kitchen VAGINAL HYSTERECTOMY    . VARICOSE VEIN SURGERY Right X 2   Social History   Social History Narrative  . No narrative on file     Objective: Vital Signs: BP 129/72 (BP Location: Left Arm, Patient Position: Sitting, Cuff Size: Large)   Pulse (!) 58   Resp 14   Ht 5\' 5"  (1.651 m)   Wt 219 lb (99.3 kg)   BMI 36.44 kg/m    Physical Exam  Constitutional: She is oriented to person, place, and time. She appears well-developed and well-nourished.  HENT:  Head: Normocephalic and atraumatic.  Eyes: Conjunctivae and EOM are normal.  Neck: Normal range of motion.  Cardiovascular: Normal rate,  regular rhythm, normal heart sounds and intact distal pulses.   Pulmonary/Chest: Effort normal and breath sounds normal.  Abdominal: Soft. Bowel sounds are normal.  Lymphadenopathy:    She has no cervical adenopathy.  Neurological: She is alert and oriented to person, place, and time.  Skin: Skin is warm and dry. Capillary refill takes less than 2 seconds.  Psychiatric: She has a normal mood and affect. Her behavior is normal.  Nursing note and vitals reviewed.    Musculoskeletal Exam: She had painful range of motion of her C-spine which was limited, thoracic spine is good range of motion, she had painful range of motion of lumbar spine which was limited as well. Some tenderness over left SI joint. She had good range of motion of bilateral shoulders, bilateral elbows, bilateral wrist joints all MCPs DIPs and PIPs area she has some  thickening of her DIP joints. Hip joints are good range of motion. She has left total knee replacement which appears to be doing well and right bunionectomy which is doing well. She has no synovitis on her lower extremities. She had tenderness on palpation over bilateral trochanteric bursa area consistent with trochanteric bursitis.  CDAI Exam: No CDAI exam completed.    Investigation: No additional findings.   Imaging: No results found.  Speciality Comments: No specialty comments available.    Procedures:  Trigger Point Inj Date/Time: 12/01/2015 3:35 PM Performed by: Bo Merino Authorized by: Bo Merino   Consent Given by:  Patient Site marked: the procedure site was marked   Timeout: prior to procedure the correct patient, procedure, and site was verified   Indications:  Muscle spasm and pain Total # of Trigger Points:  1 Location: neck   Needle Size:  27 G Approach:  Dorsal Medications #1:  0.5 mL lidocaine 1 %; 10 mg triamcinolone acetonide 40 MG/ML Patient tolerance:  Patient tolerated the procedure well with no immediate  complications   Allergies: Flexeril [cyclobenzaprine hcl]; Morphine and related; Septra [bactrim]; Sulfa antibiotics; Sulfamethoxazole-trimethoprim; Belsomra [suvorexant]; Lyrica [pregabalin]; and Tape   Assessment / Plan: Visit Diagnoses:  Fibromyalgia: Active disease with generalized pain and discomfort she's positive tender points. She's been under a lot of stress. Per her request I will refill her Topamax and methocarbamol today.  Spondylosis of lumbar region without myelopathy or radiculopathy - Scoliosis: She has lower back pain for which she's been seeing Dr. Ernestina Patches and had some relief in her lower back pain after cortisone injections.  Neck pain: She has right trapezius a spasm which is causing more discomfort per request a trigger point injection was given.  Bilateral trochanteric bursitis: I will refer her to physical therapy for that.  Primary osteoarthritis of both knees - LKR Dr. Tonny Bollman, is doing quite well.  Primary osteoarthritis of both feet - R Bunionectomy Dr. Burman Freestone. She continues to have some discomfort in her bilateral feet.  A physical therapy referral was made for fibromyalgia, neck pain, lower back pain and trochanteric bursitis.  She has other medical problems which are listed as follows:  History of gastric bypass, 11/20/2010  Morbid obesity ([place])  Sleep apnea, unspecified type  Hypothyroidism, unspecified type  Cerebral infarction, unspecified mechanism ([place])  Essential hypertension    Orders: No orders of the defined types were placed in this encounter.  Meds ordered this encounter  Medications                                                  . methocarbamol (ROBAXIN) 750 MG tablet    Sig: Take 1 tablet (750 mg total) by mouth 3 (three) times daily.    Dispense:  90 tablet    Refill:  2  . topiramate (TOPAMAX) 100 MG tablet    Sig: Take 1.5 tablets (150 mg total) by mouth at bedtime.    Dispense:  135 tablet    Refill:  0     Face-to-face time spent with patient was 62minutes. 50% of time was spent in counseling and coordination of care.  Follow-Up Instructions: Return in about 6 months (around 05/30/2016) for Osteoarthritis.   Bo Merino, MD, [facility]

## 2015-12-27 ENCOUNTER — Ambulatory Visit (HOSPITAL_COMMUNITY): Payer: BLUE CROSS/BLUE SHIELD | Attending: Rheumatology | Admitting: Physical Therapy

## 2015-12-27 DIAGNOSIS — R296 Repeated falls: Secondary | ICD-10-CM | POA: Insufficient documentation

## 2015-12-27 DIAGNOSIS — R2681 Unsteadiness on feet: Secondary | ICD-10-CM | POA: Insufficient documentation

## 2015-12-27 DIAGNOSIS — M545 Low back pain, unspecified: Secondary | ICD-10-CM

## 2015-12-27 NOTE — Therapy (Addendum)
[place], [place], [address] Phone: [phone]   Fax:  [phone]  Physical Therapy Evaluation  Patient Details  Name: MAECY PODGURSKI MRN: [id number] Date of Birth: [birth year] Referring Provider: Bo Merino  Encounter Date: 12/27/2015      PT End of Session - 12/27/15 1026    Visit Number 1   Number of Visits 12   Date for PT Re-Evaluation 01/26/16   Authorization Type [employer]   Authorization - Visit Number 1   Authorization - Number of Visits 10   PT Start Time 0815   PT Stop Time 0900   PT Time Calculation (min) 45 min   Activity Tolerance Patient tolerated treatment well      Past Medical History:  Diagnosis Date  . ADD (attention deficit disorder)    takes Adderall daily  . Arthritis    "all over"  . Chronic back pain    "all over"  . Constipation    takes stool softener daily  . Degenerative disk disease   . Degenerative joint disease   . Depression    takes Cymbalta daily for pain per pt  . DVT (deep venous thrombosis) (HCC)    RLE  . Family history of adverse reaction to anesthesia    a family member woke up during surgery; "think it was my mom"  . Fibromyalgia   . GERD (gastroesophageal reflux disease)   . History of blood clots    superficial  . Hyperglycemia   . Hypothyroid    takes Synthroid daily  . Incomplete emptying of bladder   . Insomnia    takes Trazodone nightly  . Iron deficiency anemia    takes Ferrous Sulfate daily  . Joint pain   . Joint swelling    knees and ankles  . Morbid obesity ([place])   . Nausea    takes Zofran as needed.Seeing GI doc  . OSA on CPAP   . Osteoarthritis   . PFO (patent foramen ovale)   . Scoliosis   . Stroke [facility]) "several"   right foot weakness; memory issues, black spot right visual field since" (03/23/2015)  . Thrombophlebitis     Past Surgical History:  Procedure Laterality Date  . BUNIONECTOMY Right 08/2015  . COLONOSCOPY  N/A 03/25/2013   Procedure: COLONOSCOPY;  Surgeon: Rogene Houston, MD;  [facility];  Service: Endoscopy;  Laterality: N/A;  930  . ESOPHAGOGASTRODUODENOSCOPY    . EXPLORATORY LAPAROTOMY     "took fallopian tubes out"  . JOINT REPLACEMENT    . KNEE ARTHROSCOPY Left   . KNEE ARTHROSCOPY W/ ACL RECONSTRUCTION Right    "added pins"  . LAPAROSCOPIC CHOLECYSTECTOMY  ~ 2001  . 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 TOTAL KNEE ARTHROPLASTY Left 03/23/2015  . TOTAL KNEE ARTHROPLASTY Left 03/23/2015   Procedure: TOTAL KNEE ARTHROPLASTY;  Surgeon: Newt Minion, MD;  Location: [place];  Service: Orthopedics;  Laterality: Left;  Marland Kitchen VAGINAL HYSTERECTOMY    . VARICOSE VEIN SURGERY Right X 2    There were no vitals filed for this visit.       Subjective Assessment - 12/27/15 0818    Subjective Ms. Fana has a history of chronic low back pain and cervical pain.  She has recently had three cortisone injections in her low back but continues to have increased pain since she had a TKR, and therefore has been referred to therapy.     Pertinent  History Fibromyalgia, OA, scoliosis, DJD, DDD, Lt TKR , CVA, bunionectomy   Limitations Lifting;Standing;Walking;House hold activities   How long can you sit comfortably? needs support in her loweer back    How long can you stand comfortably? able to stand for 30 minutes.    How long can you walk comfortably? able to walk up to an hour and and a half but will be really hurting.      Patient Stated Goals decreased back pain    Currently in Pain? Yes   Pain Score 4   will get up to an 8    Pain Location Back   Pain Orientation Lower   Pain Descriptors / Indicators Aching   Pain Type Chronic pain   Pain Onset More than a month ago   Pain Frequency Constant   Aggravating Factors  being on feet    Pain Relieving Factors medicine    Effect of Pain on Daily Activities increases pain             [facility] PT Assessment - 12/27/15 0001       Assessment   Medical Diagnosis low back pain   Referring Provider Bo Merino   Onset Date/Surgical Date 09/13/15   Next MD Visit 05/30/2017   Prior Therapy chiropractor      Precautions   Precautions None     Restrictions   Weight Bearing Restrictions No     Balance Screen   Has the patient fallen in the past 6 months Yes   How many times? 1   Has the patient had a decrease in activity level because of a fear of falling?  No   Is the patient reluctant to leave their home because of a fear of falling?  No     Home Ecologist residence     Prior Function   Level of Independence Independent     Cognition   Overall Cognitive Status Within Functional Limits for tasks assessed     Observation/Other Assessments   Focus on Therapeutic Outcomes (FOTO)  58     Posture/Postural Control   Posture/Postural Control Postural limitations   Postural Limitations Forward head;Increased thoracic kyphosis   Posture Comments noted abdominal protrusion      ROM / Strength   AROM / PROM / Strength AROM;Strength     AROM   AROM Assessment Site Lumbar   Lumbar Extension 30 degrees reps no change    Lumbar - Right Side Bend 15 degrees reps increased pain    Lumbar - Left Side Bend 21 reps increase pain      Strength   Overall Strength Comments core strength 3/5    Strength Assessment Site Hip;Knee;Ankle   Right/Left Hip Right;Left   Right Hip Flexion 4+/5   Right Hip Extension 5/5   Right Hip ABduction 5/5   Left Hip Flexion 4+/5   Left Hip Extension 5/5   Left Hip ABduction 5/5   Right/Left Knee Right;Left   Right Knee Flexion 5/5   Right Knee Extension 4/5   Left Knee Flexion 5/5   Right/Left Ankle Right;Left   Right Ankle Dorsiflexion 5/5   Left Ankle Dorsiflexion 5/5     Flexibility   Soft Tissue Assessment /Muscle Length yes   Hamstrings Lt 160; RT 150                   [facility] Adult PT Treatment/Exercise - 12/27/15 0001       Exercises  Exercises Lumbar     Lumbar Exercises: Supine   AB Set Limitations 6 star 3 times each    Other Supine Lumbar Exercises decompression ex 1-5                 PT Education - 12/27/15 1025    Education provided Yes   Education Details Pt given 6 pt abdominal sheet along with decompression exercises 1-5.   Person(s) Educated Patient   Methods Explanation   Comprehension Verbalized understanding;Returned demonstration          PT Short Term Goals - 12/27/15 1036      PT SHORT TERM GOAL #1   Title Pt low back  pain to be no greater than a 6/10 to allow pt to be able to be on her feet for and hour for functional tasks without significant increase of back pain    Time 3   Period Weeks   Status New     PT SHORT TERM GOAL #2   Title Pt to be aware of and demonstrating proper body mechanics to decrease stress in patients lower back to allow pt to complete light housework activity without increased back pain   Time 3   Period Weeks   Status New     PT SHORT TERM GOAL #3   Title Pt to have begun a walking program to allow back and muscular pain from fibromyalgia to decrease to no greater than a 6/10    Time 3   Period Weeks   Status New           PT Long Term Goals - 12/27/15 1039      PT LONG TERM GOAL #1   Title Pt to be aware of and using abdominal bracing while completing functional activity to allow back pain to decrease to no greater than a 4/10 throughout the day.    Time 6   Period Weeks   Status New     PT LONG TERM GOAL #2   Title Pt to be able to carry groceries into her home without having increased low back pain    Time 6   Period Weeks   Status New     PT LONG TERM GOAL #3   Title Pt to be able to go up and down a flight of steps without having increased low back pain    Time 6   Period Weeks   Status New               Plan - 12/27/15 1027    Clinical Impression Statement Ms. Savary is a 55 yo female who has had a  previous CVA, fibromyalgia, Lt TKR, bunionectomy, OA, and chronic back pain.  Her back pain has been exacerbated since her bunionectomy which occured in August of this year.  She has had three cortisone injections but continues to have pain therefore she is being referred to out-patient physical therapy.  Examination demonstrates decreased trunk ROM, increased pain, decreased activity tolerance, decreased core strength and improper body mechanics.  Ms Kalish will benefit from skilled physical therapy to address these issues and maximize her functional ability.     Rehab Potential Good   PT Frequency 2x / week   PT Duration 6 weeks   PT Treatment/Interventions ADLs/Self Care Home Management;Therapeutic activities;Therapeutic exercise;Patient/family education;Manual lymph drainage   PT Next Visit Plan begin t-band exercises both for postural and for decompression.  Review proper body mechnics for getting in and out of  bed as well as educate body mechanics for getting into crisper of refridgerator and trunk of car.     PT Home Exercise Plan given for 6 pt abdominal and decompression 1-5.    Consulted and Agree with Plan of Care Patient      Patient will benefit from skilled therapeutic intervention in order to improve the following deficits and impairments:  Decreased activity tolerance, Decreased range of motion, Decreased strength, Impaired flexibility, Pain, Improper body mechanics  Visit Diagnosis: Acute bilateral low back pain without sciatica - Plan: PT plan of care cert/re-cert  Repeated falls - Plan: PT plan of care cert/re-cert  Unsteadiness on feet - Plan: PT plan of care cert/re-cert     Problem List Patient Active Problem List   Diagnosis Date Noted  . Depression 11/29/2015  . 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 Elevated LFTs 03/17/2012  . Cerebral infarction ([place]) 10/30/2011  . PFO (patent foramen ovale) 10/30/2011  . Bradycardia 10/30/2011  . Chest pain  10/30/2011  . Hypothyroid   . Thrombophlebitis   . Sleep apnea   . Fibromyalgia   . Degenerative joint disease   . 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 Morbid obesity [facility]) 11/10/2010    Rayetta Humphrey, PT CLT [phone] 12/27/2015, 10:47 AM  [place], [place], [address] Phone: [phone]   Fax:  [phone]  Name: DOROTHEE NAPIERKOWSKI MRN: [id number] Date of Birth: January 14, 1961    PHYSICAL THERAPY DISCHARGE SUMMARY  Visits from Start of Care: 1  Current functional level related to goals / functional outcomes: See above   Remaining deficits: See above   Education / Equipment: Decompressive exercises Plan: Patient agrees to discharge.  Patient goals were not met. Patient is being discharged due to the patient's request.  ?????        Pt wanted massage, therapist explained that her pain would be better suited for stretching and strengthening.  Pt does  Not want this she wants massage; therefore cancelled appointments.  Rayetta Humphrey, [place] CLT [phone]

## 2015-12-29 ENCOUNTER — Ambulatory Visit (INDEPENDENT_AMBULATORY_CARE_PROVIDER_SITE_OTHER): Payer: BLUE CROSS/BLUE SHIELD | Admitting: Specialist

## 2015-12-29 ENCOUNTER — Telehealth: Payer: Self-pay | Admitting: Rheumatology

## 2015-12-29 ENCOUNTER — Encounter (INDEPENDENT_AMBULATORY_CARE_PROVIDER_SITE_OTHER): Payer: Self-pay | Admitting: Specialist

## 2015-12-29 VITALS — BP 107/69 | HR 65 | Ht 65.0 in | Wt 219.0 lb

## 2015-12-29 DIAGNOSIS — M5136 Other intervertebral disc degeneration, lumbar region: Secondary | ICD-10-CM

## 2015-12-29 NOTE — Progress Notes (Signed)
Office Visit Note   Patient: Debbie Pineda           Date of Birth: 03-31-1960           MRN: [id number] Visit Date: 12/29/2015              Requested by: Jake Samples, PA-C [address] [place], [place] [address] PCP: Delman Cheadle, PA-C   Assessment & Plan: Visit Diagnoses:  1. DDD (degenerative disc disease), lumbar     Plan: Since patient's ongoing symptoms we will schedule lumbar spine MRI to rule out HNP/stenosis. We'll compare scan to previous study done June 2016. Follow-up in the office after completion to discuss results.  Follow-Up Instructions: Return for after mri scan with nitka.   Orders:  Orders Placed This Encounter  Procedures  . MR Lumbar Spine w/o contrast   No orders of the defined types were placed in this encounter.     Procedures: No procedures performed   Clinical Data: No additional findings.   Subjective: Chief Complaint  Patient presents with  . Lower Back - Pain, Follow-up    Patient is retuning to follow up after esi injection done on 11/22/2015 with Dr. Ernestina Patches. Feels like injection did help, states in that area its feeling much better. However having a lot of other areas of pain. Having some pain in bilateral sides of back. States her muscles are always tight. Increase pain when up moving around.    Patient states that she did have some relief with recent injection. Continues of ongoing leg pain. States that she does have history of fibromyalgia. Review of Systems  Constitutional: Negative.   Respiratory: Negative.   Musculoskeletal: Positive for back pain.     Objective: Vital Signs: BP 107/69   Pulse 65   Ht 5\' 5"  (1.651 m)   Wt 219 lb (99.3 kg)   BMI 36.44 kg/m   Physical Exam  Constitutional: She is oriented to person, place, and time. She appears well-developed. No distress.  HENT:  Head: Normocephalic and atraumatic.  Eyes: EOM are normal. Pupils are equal, round, and reactive to light.    Pulmonary/Chest: No respiratory distress.  Neurological: She is alert and oriented to person, place, and time.  Skin: Skin is warm and dry.    Ortho Exam She does have lumbar paraspinal tenderness. Negative logroll bilateral hips. Negative straight leg raise.no motor deficits Specialty Comments:  No specialty comments available.  Imaging: No results found.   PMFS History: Patient Active Problem List   Diagnosis Date Noted  . Depression 11/29/2015  . 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 Elevated LFTs 03/17/2012  . Cerebral infarction ([place]) 10/30/2011  . PFO (patent foramen ovale) 10/30/2011  . Bradycardia 10/30/2011  . Chest pain 10/30/2011  . Hypothyroid   . Thrombophlebitis   . Sleep apnea   . Fibromyalgia   . Degenerative joint disease   . 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 Morbid obesity ([place]) 11/10/2010   Past Medical History:  Diagnosis Date  . ADD (attention deficit disorder)    takes Adderall daily  . Arthritis    "all over"  . Chronic back pain    "all over"  . Constipation    takes stool softener daily  . Degenerative disk disease   . Degenerative joint disease   . Depression    takes Cymbalta daily for pain per pt  . DVT (deep venous thrombosis) (HCC)    RLE  . Family history of  adverse reaction to anesthesia    a family member woke up during surgery; "think it was my mom"  . Fibromyalgia   . GERD (gastroesophageal reflux disease)   . History of blood clots    superficial  . Hyperglycemia   . Hypothyroid    takes Synthroid daily  . Incomplete emptying of bladder   . Insomnia    takes Trazodone nightly  . Iron deficiency anemia    takes Ferrous Sulfate daily  . Joint pain   . Joint swelling    knees and ankles  . Morbid obesity ([place])   . Nausea    takes Zofran as needed.Seeing GI doc  . OSA on CPAP   . Osteoarthritis   . PFO (patent foramen ovale)   . Scoliosis   . Stroke [facility]) "several"   right foot weakness;  memory issues, black spot right visual field since" (03/23/2015)  . Thrombophlebitis     Family History  Problem Relation Age of Onset  . Heart disease Father   . Cancer Father   . Heart disease Brother   . Heart disease Sister   . Cancer Maternal Grandfather   . Colon cancer Neg Hx     Past Surgical History:  Procedure Laterality Date  . BUNIONECTOMY Right 08/2015  . COLONOSCOPY N/A 03/25/2013   Procedure: COLONOSCOPY;  Surgeon: Rogene Houston, MD;  [facility];  Service: Endoscopy;  Laterality: N/A;  930  . ESOPHAGOGASTRODUODENOSCOPY    . EXPLORATORY LAPAROTOMY     "took fallopian tubes out"  . JOINT REPLACEMENT    . KNEE ARTHROSCOPY Left   . KNEE ARTHROSCOPY W/ ACL RECONSTRUCTION Right    "added pins"  . LAPAROSCOPIC CHOLECYSTECTOMY  ~ 2001  . 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 TOTAL KNEE ARTHROPLASTY Left 03/23/2015  . TOTAL KNEE ARTHROPLASTY Left 03/23/2015   Procedure: TOTAL KNEE ARTHROPLASTY;  Surgeon: Newt Minion, MD;  Location: [facility];  Service: Orthopedics;  Laterality: Left;  Marland Kitchen VAGINAL HYSTERECTOMY    . VARICOSE VEIN SURGERY Right X 2   Social History   Occupational History  . Disability   . formerly Therapist, sports, [employer] History Main Topics  . Smoking status: Former Smoker    Packs/day: 1.00    Years: 8.00    Types: Cigarettes    Quit date: 12/01/1990  . Smokeless tobacco: Never Used     Comment: quit smoking in the 1990s  . Alcohol use No     Comment: 03/23/2015 "stopped drinking in 2012 w/gastric bypass; drank socially before bypass"  . Drug use: No  . Sexual activity: Not Currently    Birth control/ protection: Surgical

## 2015-12-29 NOTE — Telephone Encounter (Signed)
Orders for physical therapy that includes massage. Patient took orders to [facility] and had evaluation done, but without massage therapy. Patient saw Dr Louanne Skye today and he doesn't want anything done until after MRI results come back. Is there somewhere else patient can go for physical therapy that includes massage therapy. Please advise.

## 2015-12-30 ENCOUNTER — Telehealth (HOSPITAL_COMMUNITY): Payer: Self-pay | Admitting: Physical Therapy

## 2015-12-30 NOTE — Telephone Encounter (Signed)
Patient states she saw her MD and they wanted her to stop PT until 02-09-16, pt wanted to be d/c due to the fact that we don't do massage

## 2016-01-02 NOTE — Telephone Encounter (Signed)
Patient advised [facility] is the place that may be able to offer both physical therapy and massage. Patient states she would like to call and see what her insurance coverage will be like. Patient to call back if she would like referral.

## 2016-01-02 NOTE — Telephone Encounter (Signed)
Generally, insurance does not cover for massage.Sometimes [facility] is able to offer massage along with physical therapy as a part of their service.She can call [facility] and find out if her insurance is covered and if she wants to go there he can write her prescription for physical therapy at that place.Please notify patient

## 2016-01-02 NOTE — Telephone Encounter (Signed)
Do you know of anywhere?

## 2016-01-03 ENCOUNTER — Telehealth: Payer: Self-pay | Admitting: Rheumatology

## 2016-01-03 ENCOUNTER — Ambulatory Visit (HOSPITAL_COMMUNITY): Payer: BLUE CROSS/BLUE SHIELD | Admitting: Physical Therapy

## 2016-01-03 DIAGNOSIS — M542 Cervicalgia: Secondary | ICD-10-CM

## 2016-01-03 DIAGNOSIS — M797 Fibromyalgia: Secondary | ICD-10-CM

## 2016-01-03 NOTE — Telephone Encounter (Signed)
Patient is requesting the referral for [facility]. She is asking that the referral be faxed to them and emailed to her? I advised patient that it could be mailed to her and she said she has an appt on Thursday for [facility] and wants it emailed.  Email: [email or web address]

## 2016-01-03 NOTE — Telephone Encounter (Signed)
Left message to advise the referral to [facility] is being made. Patient advised that we would mail a copy to her but she may not receive it before her appointment.

## 2016-01-05 ENCOUNTER — Ambulatory Visit (HOSPITAL_COMMUNITY): Payer: BLUE CROSS/BLUE SHIELD | Admitting: Physical Therapy

## 2016-01-09 ENCOUNTER — Ambulatory Visit (HOSPITAL_COMMUNITY)
Admission: RE | Admit: 2016-01-09 | Discharge: 2016-01-09 | Disposition: A | Discharge status: Home / Self Care | Payer: BLUE CROSS/BLUE SHIELD | Source: Ambulatory Visit | Attending: Surgery | Admitting: Surgery

## 2016-01-09 ENCOUNTER — Ambulatory Visit (HOSPITAL_COMMUNITY): Payer: BLUE CROSS/BLUE SHIELD

## 2016-01-09 DIAGNOSIS — M5136 Other intervertebral disc degeneration, lumbar region: Secondary | ICD-10-CM | POA: Insufficient documentation

## 2016-01-09 DIAGNOSIS — M48061 Spinal stenosis, lumbar region without neurogenic claudication: Secondary | ICD-10-CM | POA: Diagnosis not present

## 2016-01-11 ENCOUNTER — Encounter (HOSPITAL_COMMUNITY): Payer: BLUE CROSS/BLUE SHIELD

## 2016-01-11 ENCOUNTER — Telehealth (INDEPENDENT_AMBULATORY_CARE_PROVIDER_SITE_OTHER): Payer: Self-pay | Admitting: Specialist

## 2016-01-11 ENCOUNTER — Ambulatory Visit (INDEPENDENT_AMBULATORY_CARE_PROVIDER_SITE_OTHER): Payer: BLUE CROSS/BLUE SHIELD | Admitting: Orthopedic Surgery

## 2016-01-11 VITALS — Ht 65.0 in | Wt 219.0 lb

## 2016-01-11 DIAGNOSIS — M7061 Trochanteric bursitis, right hip: Secondary | ICD-10-CM

## 2016-01-11 DIAGNOSIS — G8929 Other chronic pain: Secondary | ICD-10-CM | POA: Diagnosis not present

## 2016-01-11 DIAGNOSIS — M7062 Trochanteric bursitis, left hip: Secondary | ICD-10-CM

## 2016-01-11 DIAGNOSIS — M545 Low back pain, unspecified: Secondary | ICD-10-CM

## 2016-01-11 HISTORY — DX: Low back pain, unspecified: M54.50

## 2016-01-11 HISTORY — DX: Other chronic pain: G89.29

## 2016-01-11 MED ORDER — METHYLPREDNISOLONE ACETATE 40 MG/ML IJ SUSP
40.0000 mg | INTRAMUSCULAR | Status: AC | PRN
  Administered 2016-01-11: 40 mg via INTRA_ARTICULAR

## 2016-01-11 MED ORDER — LIDOCAINE HCL 1 % IJ SOLN
5.0000 mL | INTRAMUSCULAR | Status: AC | PRN
  Administered 2016-01-11: 5 mL

## 2016-01-11 NOTE — Telephone Encounter (Signed)
Patient is requesting a copy of her MRI report.  Cb#: [phone]

## 2016-01-11 NOTE — Progress Notes (Signed)
Office Visit Note   Patient: Debbie Pineda           Date of Birth: [birth year]           MRN: [id number] Visit Date: 01/11/2016              Requested by: Jake Samples, PA-C [address] [place], [place] [address] PCP: Delman Cheadle, PA-C   Assessment & Plan: Visit Diagnoses:  1. Trochanteric bursitis, left hip   2. Trochanteric bursitis, right hip   3. Chronic bilateral low back pain without sciatica   Patient has no radicular symptoms no sciatic symptoms.  Plan: Greater trochanter both hips injected without complications. Follow-up as needed.  Follow-Up Instructions: Return if symptoms worsen or fail to improve.   Orders:  No orders of the defined types were placed in this encounter.  No orders of the defined types were placed in this encounter.     Procedures: Large Joint Inj Date/Time: 01/11/2016 1:24 PM Performed by: DUDA, MARCUS V Authorized by: Newt Minion   Consent Given by:  Patient Site marked: the procedure site was marked   Timeout: prior to procedure the correct patient, procedure, and site was verified   Indications:  Pain and diagnostic evaluation Location:  Hip Site:  R greater trochanter Prep: patient was prepped and draped in usual sterile fashion   Needle Size:  22 G Needle Length:  1.5 inches Approach:  Lateral Ultrasound Guidance: No   Fluoroscopic Guidance: No   Arthrogram: No   Medications:  5 mL lidocaine 1 %; 40 mg methylPREDNISolone acetate 40 MG/ML Aspiration Attempted: No   Patient tolerance:  Patient tolerated the procedure well with no immediate complications Large Joint Inj Date/Time: 01/11/2016 1:24 PM Performed by: DUDA, MARCUS V Authorized by: Newt Minion   Consent Given by:  Patient Site marked: the procedure site was marked   Timeout: prior to procedure the correct patient, procedure, and site was verified   Indications:  Pain and diagnostic evaluation Location:  Hip Site:  L greater  trochanter Prep: patient was prepped and draped in usual sterile fashion   Needle Size:  22 G Needle Length:  1.5 inches Approach:  Lateral Ultrasound Guidance: No   Fluoroscopic Guidance: No   Arthrogram: No   Medications:  5 mL lidocaine 1 %; 40 mg methylPREDNISolone acetate 40 MG/ML Aspiration Attempted: No   Patient tolerance:  Patient tolerated the procedure well with no immediate complications     Clinical Data: No additional findings.   Subjective: Chief Complaint  Patient presents with  . Right Hip - Pain  . Left Hip - Pain    Patient complains of bilateral hip pain and points to the lateral side of her hip. She does not complain of any groin pain and does not complain of any rad pain. She does ambulate with a cane. Pt states that she thinks it is bursitis and was wanting bilateral hip injections today.     Review of Systems   Objective: Vital Signs: Ht 5\' 5"  (1.651 m)   Wt 219 lb (99.3 kg)   BMI 36.44 kg/m   Physical Exam patient is alert and no adenopathy well-dressed normal affect normal respiratory effort she has a normal gait. Patient has no pain with range of motion of the hip knee or ankle. She has a negative straight leg raise bilaterally no focal motor weakness. Patient is point tender over the greater trochanter bursa bilaterally.  Ortho Exam  Specialty Comments:  No specialty comments available.  Imaging: No results found.   PMFS History: Patient Active Problem List   Diagnosis Date Noted  . Chronic bilateral low back pain without sciatica 01/11/2016  . Trochanteric bursitis, left hip 01/11/2016  . Trochanteric bursitis, right hip 01/11/2016  . Depression 11/29/2015  . 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 Elevated LFTs 03/17/2012  . Cerebral infarction ([place]) 10/30/2011  . PFO (patent foramen ovale) 10/30/2011  . Bradycardia 10/30/2011  . Chest pain 10/30/2011  . Hypothyroid   . Thrombophlebitis   . Sleep apnea   .  Fibromyalgia   . Degenerative joint disease   . 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 Morbid obesity ([place]) 11/10/2010   Past Medical History:  Diagnosis Date  . ADD (attention deficit disorder)    takes Adderall daily  . Arthritis    "all over"  . Chronic back pain    "all over"  . Constipation    takes stool softener daily  . Degenerative disk disease   . Degenerative joint disease   . Depression    takes Cymbalta daily for pain per pt  . DVT (deep venous thrombosis) (HCC)    RLE  . Family history of adverse reaction to anesthesia    a family member woke up during surgery; "think it was my mom"  . Fibromyalgia   . GERD (gastroesophageal reflux disease)   . History of blood clots    superficial  . Hyperglycemia   . Hypothyroid    takes Synthroid daily  . Incomplete emptying of bladder   . Insomnia    takes Trazodone nightly  . Iron deficiency anemia    takes Ferrous Sulfate daily  . Joint pain   . Joint swelling    knees and ankles  . Morbid obesity ([place])   . Nausea    takes Zofran as needed.Seeing GI doc  . OSA on CPAP   . Osteoarthritis   . PFO (patent foramen ovale)   . Scoliosis   . Stroke [facility]) "several"   right foot weakness; memory issues, black spot right visual field since" (03/23/2015)  . Thrombophlebitis     Family History  Problem Relation Age of Onset  . Heart disease Father   . Cancer Father   . Heart disease Brother   . Heart disease Sister   . Cancer Maternal Grandfather   . Colon cancer Neg Hx     Past Surgical History:  Procedure Laterality Date  . BUNIONECTOMY Right 08/2015  . COLONOSCOPY N/A 03/25/2013   Procedure: COLONOSCOPY;  Surgeon: Rogene Houston, MD;  [facility];  Service: Endoscopy;  Laterality: N/A;  930  . ESOPHAGOGASTRODUODENOSCOPY    . EXPLORATORY LAPAROTOMY     "took fallopian tubes out"  . JOINT REPLACEMENT    . KNEE ARTHROSCOPY Left   . KNEE ARTHROSCOPY W/ ACL RECONSTRUCTION Right    "added  pins"  . LAPAROSCOPIC CHOLECYSTECTOMY  ~ 2001  . 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 TOTAL KNEE ARTHROPLASTY Left 03/23/2015  . TOTAL KNEE ARTHROPLASTY Left 03/23/2015   Procedure: TOTAL KNEE ARTHROPLASTY;  Surgeon: Newt Minion, MD;  Location: [place];  Service: Orthopedics;  Laterality: Left;  Marland Kitchen VAGINAL HYSTERECTOMY    . VARICOSE VEIN SURGERY Right X 2   Social History   Occupational History  . Disability   . formerly Therapist, sports, [employer] History Main Topics  . Smoking status: Former Smoker    Packs/day:  1.00    Years: 8.00    Types: Cigarettes    Quit date: 12/01/1990  . Smokeless tobacco: Never Used     Comment: quit smoking in the 1990s  . Alcohol use No     Comment: 03/23/2015 "stopped drinking in 2012 w/gastric bypass; drank socially before bypass"  . Drug use: No  . Sexual activity: Not Currently    Birth control/ protection: Surgical

## 2016-01-12 ENCOUNTER — Other Ambulatory Visit: Payer: Self-pay | Admitting: Rheumatology

## 2016-01-12 DIAGNOSIS — M797 Fibromyalgia: Secondary | ICD-10-CM

## 2016-01-12 NOTE — Telephone Encounter (Signed)
Okay to refill. Labs from August 2017 appear normal.

## 2016-01-12 NOTE — Telephone Encounter (Signed)
Last Visit: 12/01/15 Next visit: 05/30/16 Labs: 07/27/15 ALT Elevated, Alkaline Phosphate Elevated  Okay to refill Topiramate?

## 2016-01-13 NOTE — Telephone Encounter (Signed)
Called and advised copy of MRI report at front desk ready to be picked up Thanks. Nira Conn

## 2016-01-18 ENCOUNTER — Encounter (HOSPITAL_COMMUNITY): Payer: BLUE CROSS/BLUE SHIELD | Admitting: Physical Therapy

## 2016-01-18 ENCOUNTER — Other Ambulatory Visit (INDEPENDENT_AMBULATORY_CARE_PROVIDER_SITE_OTHER): Payer: Self-pay | Admitting: Internal Medicine

## 2016-01-20 ENCOUNTER — Ambulatory Visit: Payer: Self-pay | Admitting: Rheumatology

## 2016-01-25 ENCOUNTER — Encounter (HOSPITAL_COMMUNITY): Payer: BLUE CROSS/BLUE SHIELD

## 2016-01-27 ENCOUNTER — Encounter (HOSPITAL_COMMUNITY): Payer: BLUE CROSS/BLUE SHIELD | Admitting: Physical Therapy

## 2016-01-27 DIAGNOSIS — R350 Frequency of micturition: Secondary | ICD-10-CM | POA: Diagnosis not present

## 2016-01-27 DIAGNOSIS — R3914 Feeling of incomplete bladder emptying: Secondary | ICD-10-CM | POA: Diagnosis not present

## 2016-02-01 DIAGNOSIS — Z6835 Body mass index (BMI) 35.0-35.9, adult: Secondary | ICD-10-CM | POA: Diagnosis not present

## 2016-02-01 DIAGNOSIS — Z79891 Long term (current) use of opiate analgesic: Secondary | ICD-10-CM | POA: Diagnosis not present

## 2016-02-01 DIAGNOSIS — G894 Chronic pain syndrome: Secondary | ICD-10-CM | POA: Diagnosis not present

## 2016-02-01 DIAGNOSIS — Z1389 Encounter for screening for other disorder: Secondary | ICD-10-CM | POA: Diagnosis not present

## 2016-02-09 ENCOUNTER — Ambulatory Visit (INDEPENDENT_AMBULATORY_CARE_PROVIDER_SITE_OTHER): Payer: BLUE CROSS/BLUE SHIELD | Admitting: Specialist

## 2016-03-01 DIAGNOSIS — M519 Unspecified thoracic, thoracolumbar and lumbosacral intervertebral disc disorder: Secondary | ICD-10-CM | POA: Diagnosis not present

## 2016-03-01 DIAGNOSIS — M797 Fibromyalgia: Secondary | ICD-10-CM | POA: Diagnosis not present

## 2016-03-01 DIAGNOSIS — Z6833 Body mass index (BMI) 33.0-33.9, adult: Secondary | ICD-10-CM | POA: Diagnosis not present

## 2016-03-01 DIAGNOSIS — Z1389 Encounter for screening for other disorder: Secondary | ICD-10-CM | POA: Diagnosis not present

## 2016-03-09 DIAGNOSIS — Z1389 Encounter for screening for other disorder: Secondary | ICD-10-CM | POA: Diagnosis not present

## 2016-03-09 DIAGNOSIS — Z6834 Body mass index (BMI) 34.0-34.9, adult: Secondary | ICD-10-CM | POA: Diagnosis not present

## 2016-03-09 DIAGNOSIS — E6609 Other obesity due to excess calories: Secondary | ICD-10-CM | POA: Diagnosis not present

## 2016-03-09 DIAGNOSIS — J019 Acute sinusitis, unspecified: Secondary | ICD-10-CM | POA: Diagnosis not present

## 2016-03-12 ENCOUNTER — Telehealth (INDEPENDENT_AMBULATORY_CARE_PROVIDER_SITE_OTHER): Payer: Self-pay | Admitting: Internal Medicine

## 2016-03-12 ENCOUNTER — Encounter (INDEPENDENT_AMBULATORY_CARE_PROVIDER_SITE_OTHER): Payer: Self-pay | Admitting: Internal Medicine

## 2016-03-12 ENCOUNTER — Ambulatory Visit (INDEPENDENT_AMBULATORY_CARE_PROVIDER_SITE_OTHER): Payer: BLUE CROSS/BLUE SHIELD | Admitting: Internal Medicine

## 2016-03-12 VITALS — BP 134/80 | HR 68 | Temp 98.0°F | Ht 65.0 in | Wt 206.6 lb

## 2016-03-12 DIAGNOSIS — R748 Abnormal levels of other serum enzymes: Secondary | ICD-10-CM | POA: Diagnosis not present

## 2016-03-12 NOTE — Telephone Encounter (Signed)
This patient was seen today, she would like a copy of her note mailed to her when it's ready.  [phone]

## 2016-03-12 NOTE — Progress Notes (Addendum)
Subjective:    Patient ID: Debbie Pineda, female    DOB: 1960-03-26, 56 y.o.   MRN: [id number]  HPI Here today for f/u of her elevated liver enzymes. She was last seen in August of 2017. Enzymes in March were normal. She underwent an US abdomen in August which revealed IMPRESSION: 1. Mildly increased hepatic echotexture consistent with fatty infiltration. Subjective mild hepatomegaly. The spleen and pancreas exhibit no acute abnormalities. The gallbladder is surgically absent. 2. No acute abnormality observed within the abdomen. She has lost from 221.6 to 206.6 which was intentional. She is going to the [facility] once a week for her back and legs. Her appetite is fair.  Gastric bypass 4 yrs ago.  She usually has a BM every 2-3 days and sometimes daily.  Occasionally has constipation and takes Linzess.  Her last colonoscopy was in 2015 and was normal. . Next colonoscopy in 2025. The Omeprazole is controlling her acid reflux.  Has to take Simethicone for gas.  Hepatic Function Latest Ref Rng & Units 09/07/2015 03/21/2015 03/15/2015  Total Protein 6.1 - 8.1 g/dL 6.6 6.6 6.0(L)  Albumin 3.6 - 5.1 g/dL 4.0 4.1 3.5  AST 10 - 35 U/L 14 21 96(H)  ALT 6 - 29 U/L 16 25 75(H)  Alk Phosphatase 33 - 130 U/L 89 83 101  Total Bilirubin 0.2 - 1.2 mg/dL 0.5 0.4 0.5  Bilirubin, Direct <=0.2 mg/dL 0.1 0.1 -       Review of Systems Past Medical History:  Diagnosis Date  . ADD (attention deficit disorder)    takes Adderall daily  . Arthritis    "all over"  . Chronic back pain    "all over"  . Constipation    takes stool softener daily  . Degenerative disk disease   . Degenerative joint disease   . Depression    takes Cymbalta daily for pain per pt  . DVT (deep venous thrombosis) (HCC)    RLE  . Family history of adverse reaction to anesthesia    a family member woke up during surgery; "think it was my mom"  . Fibromyalgia   . GERD (gastroesophageal reflux disease)   . History of blood  clots    superficial  . Hyperglycemia   . Hypothyroid    takes Synthroid daily  . Incomplete emptying of bladder   . Insomnia    takes Trazodone nightly  . Iron deficiency anemia    takes Ferrous Sulfate daily  . Joint pain   . Joint swelling    knees and ankles  . Morbid obesity ([place])   . Nausea    takes Zofran as needed.Seeing GI doc  . OSA on CPAP   . Osteoarthritis   . PFO (patent foramen ovale)   . Scoliosis   . Stroke [facility]) "several"   right foot weakness; memory issues, black spot right visual field since" (03/23/2015)  . Thrombophlebitis     Past Surgical History:  Procedure Laterality Date  . BUNIONECTOMY Right 08/2015  . COLONOSCOPY N/A 03/25/2013   Procedure: COLONOSCOPY;  Surgeon: Rogene Houston, MD;  [facility];  Service: Endoscopy;  Laterality: N/A;  930  . ESOPHAGOGASTRODUODENOSCOPY    . EXPLORATORY LAPAROTOMY     "took fallopian tubes out"  . JOINT REPLACEMENT    . KNEE ARTHROSCOPY Left   . KNEE ARTHROSCOPY W/ ACL RECONSTRUCTION Right    "added pins"  . LAPAROSCOPIC CHOLECYSTECTOMY  ~ 2001  . 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 TOTAL KNEE ARTHROPLASTY Left 03/23/2015  . TOTAL KNEE ARTHROPLASTY Left 03/23/2015   Procedure: TOTAL KNEE ARTHROPLASTY;  Surgeon: Newt Minion, MD;  Location: [place];  Service: Orthopedics;  Laterality: Left;  Marland Kitchen VAGINAL HYSTERECTOMY    . VARICOSE VEIN SURGERY Right X 2    Allergies  Allergen Reactions  . Flexeril [Cyclobenzaprine Hcl] Other (See Comments)    Numbness of extremities  . Morphine And Related Itching    Upper torso  . Septra [Bactrim] Itching and Rash    Upper torso  . Sulfa Antibiotics Itching and Rash    Itching was upper torso  . Sulfamethoxazole-Trimethoprim Itching and Rash    Bactrim  . Belsomra [Suvorexant]   . Lyrica [Pregabalin]   . Tape Itching and Rash    Please use "paper" tape         Objective:   Physical Exam Blood pressure 134/80, pulse 68, temperature 98 F (36.7 C),  height _0  (1.651 m), weight 206 lb 9.6 oz (93.7 kg).  Alert and oriented. Skin warm and dry. Oral mucosa is moist.   . Sclera anicteric, conjunctivae is pink. Thyroid not enlarged. No cervical lymphadenopathy. Lungs clear. Heart regular rate and rhythm.  Abdomen is soft. Bowel sounds are positive. No hepatomegaly. No abdominal masses felt. No tenderness.  No edema to lower extremities.        Assessment & Plan:  Elevated liver enzymes. Her liver enzymes are normal. [place] in August showed fatty liver. OV in 1 year.

## 2016-03-12 NOTE — Patient Instructions (Signed)
OV in 1 year.  

## 2016-03-13 ENCOUNTER — Telehealth (INDEPENDENT_AMBULATORY_CARE_PROVIDER_SITE_OTHER): Payer: Self-pay | Admitting: Internal Medicine

## 2016-03-13 DIAGNOSIS — R1030 Lower abdominal pain, unspecified: Secondary | ICD-10-CM

## 2016-03-13 LAB — HEPATIC FUNCTION PANEL
ALT: 22 U/L (ref 6–29)
AST: 16 U/L (ref 10–35)
Albumin: 4.1 g/dL (ref 3.6–5.1)
Alkaline Phosphatase: 65 U/L (ref 33–130)
Bilirubin, Direct: 0.1 mg/dL (ref <=0.2)
Indirect Bilirubin: 0.3 mg/dL (ref 0.2–1.2)
Total Bilirubin: 0.4 mg/dL (ref 0.2–1.2)
Total Protein: 6.4 g/dL (ref 6.1–8.1)

## 2016-03-13 NOTE — Telephone Encounter (Signed)
[place] sch'd 03/15/16 at 945, patient aware

## 2016-03-13 NOTE — Telephone Encounter (Signed)
Note printed and mailed to patient

## 2016-03-13 NOTE — Telephone Encounter (Signed)
Debbie Pineda,  Needs US abdomen.  I have spoken with patient.

## 2016-03-15 ENCOUNTER — Ambulatory Visit (HOSPITAL_COMMUNITY)
Admission: RE | Admit: 2016-03-15 | Discharge: 2016-03-15 | Disposition: A | Discharge status: Home / Self Care | Payer: BLUE CROSS/BLUE SHIELD | Source: Ambulatory Visit | Attending: Internal Medicine | Admitting: Internal Medicine

## 2016-03-15 DIAGNOSIS — N289 Disorder of kidney and ureter, unspecified: Secondary | ICD-10-CM | POA: Diagnosis not present

## 2016-03-15 DIAGNOSIS — K838 Other specified diseases of biliary tract: Secondary | ICD-10-CM | POA: Insufficient documentation

## 2016-03-15 DIAGNOSIS — R1030 Lower abdominal pain, unspecified: Secondary | ICD-10-CM

## 2016-03-15 DIAGNOSIS — Z9049 Acquired absence of other specified parts of digestive tract: Secondary | ICD-10-CM | POA: Diagnosis not present

## 2016-03-16 ENCOUNTER — Telehealth (INDEPENDENT_AMBULATORY_CARE_PROVIDER_SITE_OTHER): Payer: Self-pay | Admitting: Internal Medicine

## 2016-03-16 NOTE — Telephone Encounter (Signed)
Patient is calling for ultrasound results  [phone]

## 2016-03-19 DIAGNOSIS — Z7901 Long term (current) use of anticoagulants: Secondary | ICD-10-CM | POA: Diagnosis not present

## 2016-03-19 DIAGNOSIS — Z86718 Personal history of other venous thrombosis and embolism: Secondary | ICD-10-CM | POA: Diagnosis not present

## 2016-03-19 DIAGNOSIS — Z8673 Personal history of transient ischemic attack (TIA), and cerebral infarction without residual deficits: Secondary | ICD-10-CM | POA: Diagnosis not present

## 2016-03-19 DIAGNOSIS — Q211 Atrial septal defect: Secondary | ICD-10-CM | POA: Diagnosis not present

## 2016-03-19 NOTE — Telephone Encounter (Signed)
Results have been given 

## 2016-03-23 ENCOUNTER — Encounter (INDEPENDENT_AMBULATORY_CARE_PROVIDER_SITE_OTHER): Payer: Self-pay | Admitting: Specialist

## 2016-03-23 ENCOUNTER — Other Ambulatory Visit (HOSPITAL_COMMUNITY): Payer: BLUE CROSS/BLUE SHIELD

## 2016-03-23 ENCOUNTER — Ambulatory Visit (INDEPENDENT_AMBULATORY_CARE_PROVIDER_SITE_OTHER): Payer: BLUE CROSS/BLUE SHIELD | Admitting: Specialist

## 2016-03-23 ENCOUNTER — Telehealth (INDEPENDENT_AMBULATORY_CARE_PROVIDER_SITE_OTHER): Payer: Self-pay

## 2016-03-23 VITALS — BP 93/57 | HR 70 | Ht 65.0 in | Wt 219.0 lb

## 2016-03-23 DIAGNOSIS — M48062 Spinal stenosis, lumbar region with neurogenic claudication: Secondary | ICD-10-CM | POA: Diagnosis not present

## 2016-03-23 DIAGNOSIS — M4698 Unspecified inflammatory spondylopathy, sacral and sacrococcygeal region: Secondary | ICD-10-CM

## 2016-03-23 DIAGNOSIS — M546 Pain in thoracic spine: Secondary | ICD-10-CM

## 2016-03-23 DIAGNOSIS — M4726 Other spondylosis with radiculopathy, lumbar region: Secondary | ICD-10-CM

## 2016-03-23 DIAGNOSIS — M2170 Unequal limb length (acquired), unspecified site: Secondary | ICD-10-CM | POA: Diagnosis not present

## 2016-03-23 DIAGNOSIS — G8929 Other chronic pain: Secondary | ICD-10-CM

## 2016-03-23 DIAGNOSIS — M47818 Spondylosis without myelopathy or radiculopathy, sacral and sacrococcygeal region: Secondary | ICD-10-CM

## 2016-03-23 NOTE — Patient Instructions (Addendum)
Avoid bending, stooping and avoid lifting weights greater than 10 lbs. Avoid prolong standing and walking. Avoid frequent bending and stooping  No lifting greater than 10 lbs. May use ice or moist heat for pain. Weight loss is of benefit. Handicap license is approved. We are scheduling you for a myelogram and post myelogram CT scan of the thoracic and lumbar spine. 5/16th inch heel lift right shoe please wear this for 2 weeks to allow [place] to assess if Changing right leg length helps.

## 2016-03-23 NOTE — Progress Notes (Signed)
Office Visit Note   Patient: Debbie Pineda           Date of Birth: [birth year]           MRN: [id number] Visit Date: 03/23/2016              Requested by: Jake Samples, PA-C [address] [place], [place] [phone] PCP: Delman Cheadle, PA-C   Assessment & Plan: Visit Diagnoses: No diagnosis found.  Plan: Avoid bending, stooping and avoid lifting weights greater than 10 lbs. Avoid prolong standing and walking. Avoid frequent bending and stooping  No lifting greater than 10 lbs. May use ice or moist heat for pain. Weight loss is of benefit. Handicap license is approved. We are scheduling you for a myelogram and post myelogram CT scan of the thoracic and lumbar spine. 5/16th inch heel lift right shoe please wear this for 2 weeks to allow [place] to assess if Changing right leg length helps.      Follow-Up Instructions: No Follow-up on file.   Orders:  No orders of the defined types were placed in this encounter.  No orders of the defined types were placed in this encounter.     Procedures: No procedures performed   Clinical Data: No additional findings.   Subjective: Chief Complaint  Patient presents with  . Lower Back - Follow-up    [name] is here to review her MRI of her Lumbar spine.  She states that she still has back pain.  She states that she is doing therapy at [facility], and she is gettting subcutaneous injections of lidocaine and sugar, but they don't last long.    Review of Systems   Objective: Vital Signs: There were no vitals taken for this visit.  Physical Exam  Ortho Exam  Specialty Comments:  No specialty comments available.  Imaging: No results found.   PMFS History: Patient Active Problem List   Diagnosis Date Noted  . Chronic bilateral low back pain without sciatica 01/11/2016  . Trochanteric bursitis, left hip 01/11/2016  . Trochanteric bursitis, right hip 01/11/2016  . Depression 11/29/2015  .  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 Elevated LFTs 03/17/2012  . Cerebral infarction ([place]) 10/30/2011  . PFO (patent foramen ovale) 10/30/2011  . Bradycardia 10/30/2011  . Chest pain 10/30/2011  . Hypothyroid   . Thrombophlebitis   . Sleep apnea   . Fibromyalgia   . Degenerative joint disease   . 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 Morbid obesity ([place]) 11/10/2010   Past Medical History:  Diagnosis Date  . ADD (attention deficit disorder)    takes Adderall daily  . Arthritis    "all over"  . Chronic back pain    "all over"  . Constipation    takes stool softener daily  . Degenerative disk disease   . Degenerative joint disease   . Depression    takes Cymbalta daily for pain per pt  . DVT (deep venous thrombosis) (HCC)    RLE  . Family history of adverse reaction to anesthesia    a family member woke up during surgery; "think it was my mom"  . Fibromyalgia   . GERD (gastroesophageal reflux disease)   . History of blood clots    superficial  . Hyperglycemia   . Hypothyroid    takes Synthroid daily  . Incomplete emptying of bladder   . Insomnia    takes Trazodone nightly  . Iron deficiency anemia  takes Ferrous Sulfate daily  . Joint pain   . Joint swelling    knees and ankles  . Morbid obesity ([place])   . Nausea    takes Zofran as needed.Seeing GI doc  . OSA on CPAP   . Osteoarthritis   . PFO (patent foramen ovale)   . Scoliosis   . Stroke [facility]) "several"   right foot weakness; memory issues, black spot right visual field since" (03/23/2015)  . Thrombophlebitis     Family History  Problem Relation Age of Onset  . Heart disease Father   . Cancer Father   . Heart disease Brother   . Heart disease Sister   . Cancer Maternal Grandfather   . Colon cancer Neg Hx     Past Surgical History:  Procedure Laterality Date  . BUNIONECTOMY Right 08/2015  . COLONOSCOPY N/A 03/25/2013   Procedure: COLONOSCOPY;  Surgeon: Rogene Houston, MD;   [facility];  Service: Endoscopy;  Laterality: N/A;  930  . ESOPHAGOGASTRODUODENOSCOPY    . EXPLORATORY LAPAROTOMY     "took fallopian tubes out"  . JOINT REPLACEMENT    . KNEE ARTHROSCOPY Left   . KNEE ARTHROSCOPY W/ ACL RECONSTRUCTION Right    "added pins"  . LAPAROSCOPIC CHOLECYSTECTOMY  ~ 2001  . 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 TOTAL KNEE ARTHROPLASTY Left 03/23/2015  . TOTAL KNEE ARTHROPLASTY Left 03/23/2015   Procedure: TOTAL KNEE ARTHROPLASTY;  Surgeon: Newt Minion, MD;  Location: [place];  Service: Orthopedics;  Laterality: Left;  Marland Kitchen VAGINAL HYSTERECTOMY    . VARICOSE VEIN SURGERY Right X 2   Social History   Occupational History  . Disability   . formerly Therapist, sports, [place] History Main Topics  . Smoking status: Former Smoker    Packs/day: 1.00    Years: 8.00    Types: Cigarettes    Quit date: 12/01/1990  . Smokeless tobacco: Never Used     Comment: quit smoking in the 1990s  . Alcohol use No     Comment: 03/23/2015 "stopped drinking in 2012 w/gastric bypass; drank socially before bypass"  . Drug use: No  . Sexual activity: Not Currently    Birth control/ protection: Surgical

## 2016-03-23 NOTE — Telephone Encounter (Signed)
I was unsure how to add a note for her, but she wants the scans done at [facility] please. Thank you

## 2016-03-26 NOTE — Telephone Encounter (Signed)
nted

## 2016-03-29 DIAGNOSIS — Z6835 Body mass index (BMI) 35.0-35.9, adult: Secondary | ICD-10-CM | POA: Diagnosis not present

## 2016-03-29 DIAGNOSIS — E162 Hypoglycemia, unspecified: Secondary | ICD-10-CM | POA: Diagnosis not present

## 2016-04-06 ENCOUNTER — Ambulatory Visit
Admission: RE | Admit: 2016-04-06 | Discharge: 2016-04-06 | Disposition: A | Discharge status: Home / Self Care | Payer: BLUE CROSS/BLUE SHIELD | Source: Ambulatory Visit | Attending: Specialist | Admitting: Specialist

## 2016-04-06 ENCOUNTER — Other Ambulatory Visit: Payer: Self-pay | Admitting: Radiology

## 2016-04-06 VITALS — BP 124/73 | HR 45

## 2016-04-06 DIAGNOSIS — M545 Low back pain, unspecified: Secondary | ICD-10-CM

## 2016-04-06 DIAGNOSIS — M5126 Other intervertebral disc displacement, lumbar region: Secondary | ICD-10-CM | POA: Diagnosis not present

## 2016-04-06 DIAGNOSIS — M5124 Other intervertebral disc displacement, thoracic region: Secondary | ICD-10-CM | POA: Diagnosis not present

## 2016-04-06 DIAGNOSIS — G8929 Other chronic pain: Secondary | ICD-10-CM

## 2016-04-06 MED ORDER — DIAZEPAM 5 MG PO TABS
10.0000 mg | ORAL_TABLET | Freq: Once | ORAL | Status: AC
  Administered 2016-04-06: 10 mg via ORAL

## 2016-04-06 MED ORDER — IOPAMIDOL (ISOVUE-M 300) INJECTION 61%
10.0000 mL | Freq: Once | INTRAMUSCULAR | Status: AC | PRN
  Administered 2016-04-06: 10 mL via INTRATHECAL

## 2016-04-06 MED ORDER — MEPERIDINE HCL 100 MG/ML IJ SOLN
75.0000 mg | Freq: Once | INTRAMUSCULAR | Status: AC
  Administered 2016-04-06: 75 mg via INTRAMUSCULAR

## 2016-04-06 MED ORDER — ONDANSETRON HCL 4 MG/2ML IJ SOLN
4.0000 mg | Freq: Once | INTRAMUSCULAR | Status: AC
  Administered 2016-04-06: 4 mg via INTRAMUSCULAR

## 2016-04-06 NOTE — Discharge Instructions (Signed)
Myelogram Discharge Instructions  1. Go home and rest quietly for the next 24 hours.  It is important to lie flat for the next 24 hours.  Get up only to go to the restroom.  You may lie in the bed or on a couch on your back, your stomach, your left side or your right side.  You may have one pillow under your head.  You may have pillows between your knees while you are on your side or under your knees while you are on your back.  2. DO NOT drive today.  Recline the seat as far back as it will go, while still wearing your seat belt, on the way home.  3. You may get up to go to the bathroom as needed.  You may sit up for 10 minutes to eat.  You may resume your normal diet and medications unless otherwise indicated.  Drink lots of extra fluids today and tomorrow.  4. The incidence of headache, nausea, or vomiting is about 5% (one in 20 patients).  If you develop a headache, lie flat and drink plenty of fluids until the headache goes away.  Caffeinated beverages may be helpful.  If you develop severe nausea and vomiting or a headache that does not go away with flat bed rest, call [phone].  5. You may resume normal activities after your 24 hours of bed rest is over; however, do not exert yourself strongly or do any heavy lifting tomorrow. If when you get up you have a headache when standing, go back to bed and force fluids for another 24 hours.  6. Call your physician for a follow-up appointment.  The results of your myelogram will be sent directly to your physician by the following day.  7. If you have any questions or if complications develop after you arrive home, please call [phone].  Discharge instructions have been explained to the patient.  The patient, or the person responsible for the patient, fully understands these instructions.       MAY RESUME [place].   May resume Cymbalta, Ultram, Ritalin and Trazodone on April 07, 2016, after 1:00 pm.

## 2016-04-06 NOTE — Progress Notes (Signed)
Valium 10 mg prescription called to [facility] in [place].

## 2016-04-06 NOTE — Progress Notes (Signed)
Pt states she has been off of Xarelto for the past 24 hours.  Pt states she has been off Cymbalta, Ritalin, Trazodone and Ultram.

## 2016-04-09 ENCOUNTER — Telehealth: Payer: Self-pay | Admitting: Radiology

## 2016-04-09 NOTE — Telephone Encounter (Signed)
Pt called due to positional headache. Pt has not done a full 2nd day of bedrest and will do that today. She will call in the morning if headache is not gone.

## 2016-04-10 ENCOUNTER — Telehealth (INDEPENDENT_AMBULATORY_CARE_PROVIDER_SITE_OTHER): Payer: Self-pay | Admitting: Specialist

## 2016-04-10 ENCOUNTER — Other Ambulatory Visit (INDEPENDENT_AMBULATORY_CARE_PROVIDER_SITE_OTHER): Payer: Self-pay | Admitting: Radiology

## 2016-04-10 NOTE — Telephone Encounter (Signed)
FYI--I hand wrote order and faxed order to [facility]

## 2016-04-10 NOTE — Telephone Encounter (Signed)
Debbie Pineda (Nurse) with [facility] called advised patient had Myelogram on 04-06-16 and need orders for a blood patch for a positional headache. The number to contact her is [phone]

## 2016-04-11 ENCOUNTER — Telehealth (INDEPENDENT_AMBULATORY_CARE_PROVIDER_SITE_OTHER): Payer: Self-pay | Admitting: Specialist

## 2016-04-11 ENCOUNTER — Ambulatory Visit
Admission: RE | Admit: 2016-04-11 | Discharge: 2016-04-11 | Disposition: A | Discharge status: Home / Self Care | Payer: BLUE CROSS/BLUE SHIELD | Source: Ambulatory Visit | Attending: Specialist | Admitting: Specialist

## 2016-04-11 ENCOUNTER — Other Ambulatory Visit (INDEPENDENT_AMBULATORY_CARE_PROVIDER_SITE_OTHER): Payer: Self-pay | Admitting: Specialist

## 2016-04-11 DIAGNOSIS — R51 Headache with orthostatic component, not elsewhere classified: Secondary | ICD-10-CM

## 2016-04-11 DIAGNOSIS — G971 Other reaction to spinal and lumbar puncture: Secondary | ICD-10-CM | POA: Diagnosis not present

## 2016-04-11 MED ORDER — IOPAMIDOL (ISOVUE-M 200) INJECTION 41%
1.0000 mL | Freq: Once | INTRAMUSCULAR | Status: AC
  Administered 2016-04-11: 1 mL via EPIDURAL

## 2016-04-11 NOTE — Telephone Encounter (Signed)
Debbie Pineda from [facility] called to give you the fax [phone] or [phone]

## 2016-04-11 NOTE — Telephone Encounter (Signed)
Thanks, I am fine with their doing a blood patch, their physicians are certainly qualified to perform this for a complication seen with myelograms, as much as I am able to perform intervention for complications of lumbar surgery. jen

## 2016-04-11 NOTE — Discharge Instructions (Signed)
Blood Patch Discharge Instructions  1. Go home and rest quietly for the next 24 hours.  It is important to lie flat for the next 24 hours.  Get up only to go to the restroom.  You may lie in the bed or on a couch on your back, your stomach, your left side or your right side.  You may have one pillow under your head.  You may have pillows between your knees while you are on your side or under your knees while you are on your back.  2. DO NOT drive today.  Recline the seat as far back as it will go, while still wearing your seat belt, on the way home.  3. You may get up to go to the bathroom as needed.  You may sit up for 10 minutes to eat.  You may resume your normal diet and medications unless otherwise indicated.  Drink lots of extra fluids today and tomorrow.  4. You may resume normal activities after your 24 hours of bed rest is over; however, do not exert yourself strongly or do any heavy lifting tomorrow.  5. Call your physician for a follow-up appointment.   6. If you have any questions  after you arrive home, please call [phone].  Discharge instructions have been explained to the patient.  The patient, or the person responsible for the patient, fully understands these instructions.    [place]

## 2016-04-11 NOTE — Telephone Encounter (Signed)
Faxed order to the [phone] number

## 2016-04-19 DIAGNOSIS — R3914 Feeling of incomplete bladder emptying: Secondary | ICD-10-CM | POA: Diagnosis not present

## 2016-04-19 DIAGNOSIS — M797 Fibromyalgia: Secondary | ICD-10-CM | POA: Diagnosis not present

## 2016-04-19 DIAGNOSIS — R3915 Urgency of urination: Secondary | ICD-10-CM

## 2016-04-19 HISTORY — DX: Urgency of urination: R39.15

## 2016-04-24 ENCOUNTER — Other Ambulatory Visit: Payer: Self-pay | Admitting: Rheumatology

## 2016-04-24 DIAGNOSIS — M797 Fibromyalgia: Secondary | ICD-10-CM

## 2016-04-24 DIAGNOSIS — R3915 Urgency of urination: Secondary | ICD-10-CM | POA: Diagnosis not present

## 2016-04-24 DIAGNOSIS — R350 Frequency of micturition: Secondary | ICD-10-CM | POA: Diagnosis not present

## 2016-04-24 NOTE — Telephone Encounter (Signed)
ok 

## 2016-04-24 NOTE — Telephone Encounter (Signed)
Last Visit: 12/01/15 Next visit: 05/30/16  Okay to refill Methocarbamol?

## 2016-05-04 DIAGNOSIS — G4733 Obstructive sleep apnea (adult) (pediatric): Secondary | ICD-10-CM | POA: Diagnosis not present

## 2016-05-04 DIAGNOSIS — L930 Discoid lupus erythematosus: Secondary | ICD-10-CM | POA: Diagnosis not present

## 2016-05-04 DIAGNOSIS — I639 Cerebral infarction, unspecified: Secondary | ICD-10-CM | POA: Diagnosis not present

## 2016-05-04 DIAGNOSIS — G4709 Other insomnia: Secondary | ICD-10-CM | POA: Diagnosis not present

## 2016-05-04 DIAGNOSIS — R413 Other amnesia: Secondary | ICD-10-CM | POA: Diagnosis not present

## 2016-05-04 DIAGNOSIS — G609 Hereditary and idiopathic neuropathy, unspecified: Secondary | ICD-10-CM | POA: Diagnosis not present

## 2016-05-04 DIAGNOSIS — Q211 Atrial septal defect: Secondary | ICD-10-CM | POA: Diagnosis not present

## 2016-05-04 DIAGNOSIS — Z6835 Body mass index (BMI) 35.0-35.9, adult: Secondary | ICD-10-CM | POA: Diagnosis not present

## 2016-05-08 DIAGNOSIS — N3281 Overactive bladder: Secondary | ICD-10-CM | POA: Diagnosis not present

## 2016-05-08 DIAGNOSIS — M797 Fibromyalgia: Secondary | ICD-10-CM | POA: Diagnosis not present

## 2016-05-10 ENCOUNTER — Ambulatory Visit (INDEPENDENT_AMBULATORY_CARE_PROVIDER_SITE_OTHER): Payer: BLUE CROSS/BLUE SHIELD | Admitting: Specialist

## 2016-05-10 ENCOUNTER — Encounter (INDEPENDENT_AMBULATORY_CARE_PROVIDER_SITE_OTHER): Payer: Self-pay | Admitting: Specialist

## 2016-05-10 VITALS — BP 108/73 | HR 85 | Ht 65.0 in | Wt 215.0 lb

## 2016-05-10 DIAGNOSIS — M48062 Spinal stenosis, lumbar region with neurogenic claudication: Secondary | ICD-10-CM

## 2016-05-10 DIAGNOSIS — M4316 Spondylolisthesis, lumbar region: Secondary | ICD-10-CM

## 2016-05-10 DIAGNOSIS — M4726 Other spondylosis with radiculopathy, lumbar region: Secondary | ICD-10-CM

## 2016-05-10 NOTE — Patient Instructions (Signed)
Avoid bending, stooping and avoid lifting weights greater than 10 lbs. Avoid prolong standing and walking. Avoid frequent bending and stooping  No lifting greater than 10 lbs. May use ice or moist heat for pain. Weight loss is of benefit. Handicap license is approved. Dr. Romona Curls secretary/Assistant will call to arrange for facet injections

## 2016-05-10 NOTE — Progress Notes (Addendum)
Office Visit Note   Patient: Debbie Pineda           Date of Birth: 1960-11-05           MRN: [id number] Visit Date: 05/10/2016              Requested by: Jake Samples, PA-C [address] [place], [place] [address] PCP: Delman Cheadle, PA-C   Assessment & Plan: Visit Diagnoses:  1. Other spondylosis with radiculopathy, lumbar region   2. Spondylolisthesis, lumbar region   3. Spinal stenosis of lumbar region with neurogenic claudication     Plan:Avoid bending, stooping and avoid lifting weights greater than 10 lbs. Avoid prolong standing and walking. Avoid frequent bending and stooping  No lifting greater than 10 lbs. May use ice or moist heat for pain. Weight loss is of benefit. Handicap license is approved. Dr. Romona Curls secretary/Assistant will call to arrange for facet injections  Follow-Up Instructions: Return in about 4 weeks (around 06/07/2016).   Orders:  Orders Placed This Encounter  Procedures  . Ambulatory referral to Physical Medicine Rehab   No orders of the defined types were placed in this encounter.     Procedures: No procedures performed   Clinical Data: No additional findings.   Subjective: Chief Complaint  Patient presents with  . Middle Back - Follow-up    Post CT Myelogram  . Lower Back - Follow-up    Post CT Myelogram    56 year old female with history of lower extremity pain and in the anterior thighs and knees and calves. Pain is worsened with standing and transitioning to standing. Standing the legs hurt,pain with sitting up and is using arms to push up. Exercises in the pool, went to pool for exercise one month ago. She is agile and able to move in any position. Numbness In the right hand noticeable in the morning an     Review of Systems  Constitutional: Negative.   HENT: Negative.   Eyes: Negative.   Respiratory: Negative.   Cardiovascular: Negative.   Gastrointestinal: Negative.   Endocrine: Negative.     Genitourinary: Negative.   Musculoskeletal: Negative.   Skin: Negative.   Allergic/Immunologic: Negative.   Neurological: Negative.   Hematological: Negative.   Psychiatric/Behavioral: Negative.      Objective: Vital Signs: BP 108/73 (BP Location: Left Arm, Patient Position: Sitting)   Pulse 85   Ht 5\' 5"  (1.651 m)   Wt 215 lb (97.5 kg)   BMI 35.78 kg/m   Physical Exam  Constitutional: She is oriented to person, place, and time. She appears well-developed and well-nourished.  HENT:  Head: Normocephalic and atraumatic.  Eyes: EOM are normal. Pupils are equal, round, and reactive to light.  Neck: Normal range of motion. Neck supple.  Pulmonary/Chest: Effort normal and breath sounds normal.  Abdominal: Soft. Bowel sounds are normal.  Neurological: She is alert and oriented to person, place, and time.  Skin: Skin is warm and dry.  Psychiatric: She has a normal mood and affect. Her behavior is normal. Judgment and thought content normal.    Back Exam   Tenderness  The patient is experiencing tenderness in the lumbar.  Range of Motion  Extension: abnormal  Flexion: normal  Lateral Bend Right: abnormal  Lateral Bend Left: abnormal  Rotation Right: abnormal  Rotation Left: abnormal   Muscle Strength  Right Quadriceps:  5/5  Left Quadriceps:  5/5  Right Hamstrings:  5/5  Left Hamstrings:  5/5   Tests  Straight leg raise right: negative Straight leg raise left: negative  Reflexes  Patellar:  Hyporeflexic normal Achilles:  Hyporeflexic normal Babinski's sign: normal   Other  Toe Walk: normal Heel Walk: normal Sensation: normal Gait: normal  Erythema: no back redness Scars: absent      Specialty Comments:  No specialty comments available.  Imaging: No results found.   PMFS History: Patient Active Problem List   Diagnosis Date Noted  . Chronic bilateral low back pain without sciatica 01/11/2016  . Trochanteric bursitis, left hip 01/11/2016  .  Trochanteric bursitis, right hip 01/11/2016  . Depression 11/29/2015  . 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 Elevated LFTs 03/17/2012  . Cerebral infarction ([place]) 10/30/2011  . PFO (patent foramen ovale) 10/30/2011  . Bradycardia 10/30/2011  . Chest pain 10/30/2011  . Hypothyroid   . Thrombophlebitis   . Sleep apnea   . Fibromyalgia   . Degenerative joint disease   . 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 Morbid obesity ([place]) 11/10/2010   Past Medical History:  Diagnosis Date  . ADD (attention deficit disorder)    takes Adderall daily  . Arthritis    "all over"  . Chronic back pain    "all over"  . Constipation    takes stool softener daily  . Degenerative disk disease   . Degenerative joint disease   . Depression    takes Cymbalta daily for pain per pt  . DVT (deep venous thrombosis) (HCC)    RLE  . Family history of adverse reaction to anesthesia    a family member woke up during surgery; "think it was my mom"  . Fibromyalgia   . GERD (gastroesophageal reflux disease)   . History of blood clots    superficial  . Hyperglycemia   . Hypothyroid    takes Synthroid daily  . Incomplete emptying of bladder   . Insomnia    takes Trazodone nightly  . Iron deficiency anemia    takes Ferrous Sulfate daily  . Joint pain   . Joint swelling    knees and ankles  . Morbid obesity ([place])   . Nausea    takes Zofran as needed.Seeing GI doc  . OSA on CPAP   . Osteoarthritis   . PFO (patent foramen ovale)   . Scoliosis   . Stroke [facility]) "several"   right foot weakness; memory issues, black spot right visual field since" (03/23/2015)  . Thrombophlebitis     Family History  Problem Relation Age of Onset  . Heart disease Father   . Cancer Father   . Heart disease Brother   . Heart disease Sister   . Cancer Maternal Grandfather   . Colon cancer Neg Hx     Past Surgical History:  Procedure Laterality Date  . BUNIONECTOMY Right 08/2015  . COLONOSCOPY N/A  03/25/2013   Procedure: COLONOSCOPY;  Surgeon: Rogene Houston, MD;  [facility];  Service: Endoscopy;  Laterality: N/A;  930  . ESOPHAGOGASTRODUODENOSCOPY    . EXPLORATORY LAPAROTOMY     "took fallopian tubes out"  . JOINT REPLACEMENT    . KNEE ARTHROSCOPY Left   . KNEE ARTHROSCOPY W/ ACL RECONSTRUCTION Right    "added pins"  . LAPAROSCOPIC CHOLECYSTECTOMY  ~ 2001  . 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 TOTAL KNEE ARTHROPLASTY Left 03/23/2015  . TOTAL KNEE ARTHROPLASTY Left 03/23/2015   Procedure: TOTAL KNEE ARTHROPLASTY;  Surgeon: Newt Minion, MD;  Location: [facility];  Service: Orthopedics;  Laterality: Left;  Marland Kitchen VAGINAL HYSTERECTOMY    . VARICOSE VEIN SURGERY Right X 2   Social History   Occupational History  . Disability   . formerly Therapist, sports, [place] History Main Topics  . Smoking status: Former Smoker    Packs/day: 1.00    Years: 8.00    Types: Cigarettes    Quit date: 12/01/1990  . Smokeless tobacco: Never Used     Comment: quit smoking in the 1990s  . Alcohol use No     Comment: 03/23/2015 "stopped drinking in 2012 w/gastric bypass; drank socially before bypass"  . Drug use: No  . Sexual activity: Not Currently    Birth control/ protection: Surgical

## 2016-05-10 NOTE — Addendum Note (Signed)
Addended by: [name] on: 05/10/2016 10:29 AM   Modules accepted: Orders

## 2016-05-14 ENCOUNTER — Ambulatory Visit (INDEPENDENT_AMBULATORY_CARE_PROVIDER_SITE_OTHER): Payer: Self-pay

## 2016-05-14 ENCOUNTER — Encounter (INDEPENDENT_AMBULATORY_CARE_PROVIDER_SITE_OTHER): Payer: Self-pay | Admitting: Physical Medicine and Rehabilitation

## 2016-05-14 ENCOUNTER — Ambulatory Visit (INDEPENDENT_AMBULATORY_CARE_PROVIDER_SITE_OTHER): Payer: BLUE CROSS/BLUE SHIELD | Admitting: Physical Medicine and Rehabilitation

## 2016-05-14 VITALS — BP 100/65 | HR 99

## 2016-05-14 DIAGNOSIS — M47816 Spondylosis without myelopathy or radiculopathy, lumbar region: Secondary | ICD-10-CM

## 2016-05-14 MED ORDER — METHYLPREDNISOLONE ACETATE 80 MG/ML IJ SUSP
80.0000 mg | Freq: Once | INTRAMUSCULAR | Status: AC
Start: 2016-05-14 — End: 2016-05-14
  Administered 2016-05-14: 80 mg

## 2016-05-14 MED ORDER — LIDOCAINE HCL (PF) 1 % IJ SOLN
0.3300 mL | Freq: Once | INTRAMUSCULAR | Status: AC
  Administered 2016-05-14: 0.3 mL

## 2016-05-14 NOTE — Progress Notes (Deleted)
Mid to lower back pain for several months. Worse first thing in the morning and walking and standing. Relief with laying. Says pain will sometimes radiate around left rib area and states there is no rhyme or reason to it. Pain into legs into feet at times but thins that comes from her fibromyalgia.

## 2016-05-14 NOTE — Procedures (Signed)
Lumbar Diagnostic Facet Joint Nerve Block with Fluoroscopic Guidance   Patient: Debbie Pineda      Date of Birth: 1960/11/17 MRN: [id number] PCP: Jana Half      Visit Date: 05/14/2016   Universal Protocol:    Date/Time: 04/23/184:23 PM  Consent Given By: the patient  Position: PRONE  Additional Comments: Vital signs were monitored before and after the procedure. Patient was prepped and draped in the usual sterile fashion. The correct patient, procedure, and site was verified.   Injection Procedure Details:  Procedure Site One Meds Administered:  Meds ordered this encounter  Medications  . lidocaine (PF) (XYLOCAINE) 1 % injection 0.3 mL  . methylPREDNISolone acetate (DEPO-MEDROL) injection 80 mg     Laterality: Bilateral  Location/Site:  L2-L3 L3-L4 L4-L5 L5-S1  Needle size: 22 G  Needle type: spinal needle  Needle Placement: Oblique pedical  Findings:  -Contrast Used: 2 mL iohexol 180 mg iodine/mL   -Comments: Excellent flow of contrast in the articular pillars.  Procedure Details: The fluoroscope beam is vertically oriented in AP and then obliqued 15 to 20 degrees to the ipsilateral side of the desired nerve to achieve the "Scotty dog" appearance.  The skin over the target area of the junction of the superior articulating process and the transverse process (sacral ala if blocking the L5 dorsal rami) was locally anesthetized with a 1 ml volume of 1% Lidocaine without Epinephrine.  The spinal needle was inserted and advanced in a trajectory view down to the target.   After contact with periosteum and negative aspirate for blood and CSF, correct placement without intravascular or epidural spread was confirmed by injecting 0.5 ml. of Isovue-250.  A spot radiograph was obtained of this image.    Next, a 0.5 ml. volume of the injectate described above was injected. The needle was then redirected to the other facet joint nerves mentioned above if  needed.  Prior to the procedure, the patient was given a Pain Diary which was completed for baseline measurements.  After the procedure, the patient rated their pain every 30 minutes and will continue rating at this frequency for a total of 5 hours.  The patient has been asked to complete the Diary and return to [place] by mail, fax or hand delivered as soon as possible.   Additional Comments:  The patient tolerated the procedure well No complications occurred Dressing: Band-Aid    Post-procedure details: Patient was observed during the procedure. Post-procedure instructions were reviewed.  Patient left the clinic in stable condition.

## 2016-05-14 NOTE — Patient Instructions (Signed)

## 2016-05-14 NOTE — Progress Notes (Signed)
Debbie Pineda - 56 y.o. female MRN [id number]  Date of birth: 1960-05-25  Office Visit Note: Visit Date: 05/14/2016 PCP: Jana Half Referred by: Jake Samples, PA*  Subjective: Chief Complaint  Patient presents with  . Lower Back - Pain   HPI: Debbie Pineda is a 56 year old female followed by Dr. Louanne Skye with chronic worsening severe mostly axial low back pain. She has had MRI and CT myelogram. Both are reviewed below. She has facet arthropathy from L2-3 down to L5-S1 bilaterally. She has pain with standing and ambulating. She has pain with extension. She is having extensive chiropractic care and physical therapy. She has had medication management including opioid management at some point. She really has gotten no relief with any of this and is very miserable with the amount of pain that she's having. Dr. Louanne Skye requests a diagnostic facet joint blocks at L2-3, L3-4, L4-5 and L5-S1 bilaterally. We discussed today radiofrequency ablation procedures. She meets all the criteria except for double diagnostic blocks. She has no stenosis on imaging and no radicular complaints. This is been chronic and ongoing for many years. Her left side is slightly worse than right but she has bilateral low back pain. Her case is complicated by fibromyalgia and history of CVA and blood clots she is taking Xarelto.    ROS Otherwise per HPI.  Assessment & Plan: Visit Diagnoses:  1. Spondylosis without myelopathy or radiculopathy, lumbar region     Plan: Findings:  Bilateral facet joint nerve/medial branch blocks of the L2-3, L3-4, L4-5 at L5-S1 joints. Pain diary was given to her.    Meds & Orders:  Meds ordered this encounter  Medications  . lidocaine (PF) (XYLOCAINE) 1 % injection 0.3 mL  . methylPREDNISolone acetate (DEPO-MEDROL) injection 80 mg    Orders Placed This Encounter  Procedures  . Facet Injection  . XR C-ARM NO REPORT    Follow-up: Return for pain dary review.    Procedures: No procedures performed  Lumbar Diagnostic Facet Joint Nerve Block with Fluoroscopic Guidance   Patient: Debbie Pineda      Date of Birth: 1960-11-21 MRN: [id number] PCP: Jana Half      Visit Date: 05/14/2016   Universal Protocol:    Date/Time: 04/23/184:23 PM  Consent Given By: the patient  Position: PRONE  Additional Comments: Vital signs were monitored before and after the procedure. Patient was prepped and draped in the usual sterile fashion. The correct patient, procedure, and site was verified.   Injection Procedure Details:  Procedure Site One Meds Administered:  Meds ordered this encounter  Medications  . lidocaine (PF) (XYLOCAINE) 1 % injection 0.3 mL  . methylPREDNISolone acetate (DEPO-MEDROL) injection 80 mg     Laterality: Bilateral  Location/Site:  L2-L3 L3-L4 L4-L5 L5-S1  Needle size: 22 G  Needle type: spinal needle  Needle Placement: Oblique pedical  Findings:  -Contrast Used: 2 mL iohexol 180 mg iodine/mL   -Comments: Excellent flow of contrast in the articular pillars.  Procedure Details: The fluoroscope beam is vertically oriented in AP and then obliqued 15 to 20 degrees to the ipsilateral side of the desired nerve to achieve the "Scotty dog" appearance.  The skin over the target area of the junction of the superior articulating process and the transverse process (sacral ala if blocking the L5 dorsal rami) was locally anesthetized with a 1 ml volume of 1% Lidocaine without Epinephrine.  The spinal needle was inserted and advanced in a trajectory view down to the target.  After contact with periosteum and negative aspirate for blood and CSF, correct placement without intravascular or epidural spread was confirmed by injecting 0.5 ml. of Isovue-250.  A spot radiograph was obtained of this image.    Next, a 0.5 ml. volume of the injectate described above was injected. The needle was then redirected to the other  facet joint nerves mentioned above if needed.  Prior to the procedure, the patient was given a Pain Diary which was completed for baseline measurements.  After the procedure, the patient rated their pain every 30 minutes and will continue rating at this frequency for a total of 5 hours.  The patient has been asked to complete the Diary and return to [place] by mail, fax or hand delivered as soon as possible.   Additional Comments:  The patient tolerated the procedure well No complications occurred Dressing: Band-Aid    Post-procedure details: Patient was observed during the procedure. Post-procedure instructions were reviewed.  Patient left the clinic in stable condition.     Clinical History: Lumbar CT myelogram 04/06/2016  IMPRESSION: 1. No acute or focal abnormality to explain the patient's thoracolumbar pain 2. Left paramedian calcified disc protrusion at T12-L1 without significant stenosis. 3. Mild osseous foraminal narrowing bilaterally at T10-11. 4. Mild bilateral foraminal narrowing at L1-2. 5. Mild left subarticular narrowing at L2-3 with moderate left and mild right foraminal stenosis. 6. Mild right subarticular narrowing at L3-4 with mild foraminal narrowing bilaterally, right greater than left. 7. Mild foraminal narrowing at L4-5 with moderate bilateral facet hypertrophy but no significant central canal stenosis. 8. Moderate facet hypertrophy and spurring is worse on the left with mild left subarticular narrowing potentially impacting the left S1 nerve root.  Lumbar spine MRI 01/09/2016  L2-3: Mild retrolisthesis. Mild disc and facet degeneration. Mild foraminal narrowing bilaterally.  L3-4: Mild retrolisthesis. Mild disc and facet degeneration. Mild right foraminal narrowing.  L4-5:  Mild disc and facet degeneration without significant stenosis  L5-S1: Moderate to advanced disc degeneration with disc space narrowing. Mild endplate spurring without significant  stenosis.  IMPRESSION: Lumbar disc and facet degeneration is stable since 07/20/2014. Negative for disc protrusion or significant spinal stenosis. Mild right foraminal narrowing at L3-4.  She reports that she quit smoking about 25 years ago. Her smoking use included Cigarettes. She has a 8.00 pack-year smoking history. She has never used smokeless tobacco. No results for input(s): HGBA1C, LABURIC in the last 8760 hours.  Objective:  VS:  HT:    WT:   BMI:     BP:100/65  HR:99bpm  TEMP: ( )  RESP:(!) 71 % Physical Exam  Musculoskeletal:  Patient ambulates without aid. She is slow to rise from a seated position. She has pain with extension rotation. She has good distal strength.    Ortho Exam Imaging: Xr C-arm No Report  Result Date: 05/14/2016 Please see Notes or Procedures tab for imaging impression.   Past Medical/Family/Surgical/Social History: Medications & Allergies reviewed per EMR Patient Active Problem List   Diagnosis Date Noted  . Chronic bilateral low back pain without sciatica 01/11/2016  . Trochanteric bursitis, left hip 01/11/2016  . Trochanteric bursitis, right hip 01/11/2016  . Depression 11/29/2015  . 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 Elevated LFTs 03/17/2012  . Cerebral infarction ([place]) 10/30/2011  . PFO (patent foramen ovale) 10/30/2011  . Bradycardia 10/30/2011  . Chest pain 10/30/2011  . Hypothyroid   . Thrombophlebitis   . Sleep apnea   . Fibromyalgia   . Degenerative  joint disease   . 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 Morbid obesity ([place]) 11/10/2010   Past Medical History:  Diagnosis Date  . ADD (attention deficit disorder)    takes Adderall daily  . Arthritis    "all over"  . Chronic back pain    "all over"  . Constipation    takes stool softener daily  . Degenerative disk disease   . Degenerative joint disease   . Depression    takes Cymbalta daily for pain per pt  . DVT (deep venous thrombosis) ([facility])      RLE  . Family history of adverse reaction to anesthesia    a family member woke up during surgery; "think it was my mom"  . Fibromyalgia   . GERD (gastroesophageal reflux disease)   . History of blood clots    superficial  . Hyperglycemia   . Hypothyroid    takes Synthroid daily  . Incomplete emptying of bladder   . Insomnia    takes Trazodone nightly  . Iron deficiency anemia    takes Ferrous Sulfate daily  . Joint pain   . Joint swelling    knees and ankles  . Morbid obesity ([place])   . Nausea    takes Zofran as needed.Seeing GI doc  . OSA on CPAP   . Osteoarthritis   . PFO (patent foramen ovale)   . Scoliosis   . Stroke [facility]) "several"   right foot weakness; memory issues, black spot right visual field since" (03/23/2015)  . Thrombophlebitis    Family History  Problem Relation Age of Onset  . Heart disease Father   . Cancer Father   . Heart disease Brother   . Heart disease Sister   . Cancer Maternal Grandfather   . Colon cancer Neg Hx    Past Surgical History:  Procedure Laterality Date  . BUNIONECTOMY Right 08/2015  . COLONOSCOPY N/A 03/25/2013   Procedure: COLONOSCOPY;  Surgeon: Rogene Houston, MD;  [facility];  Service: Endoscopy;  Laterality: N/A;  930  . ESOPHAGOGASTRODUODENOSCOPY    . EXPLORATORY LAPAROTOMY     "took fallopian tubes out"  . JOINT REPLACEMENT    . KNEE ARTHROSCOPY Left   . KNEE ARTHROSCOPY W/ ACL RECONSTRUCTION Right    "added pins"  . LAPAROSCOPIC CHOLECYSTECTOMY  ~ 2001  . 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 TOTAL KNEE ARTHROPLASTY Left 03/23/2015  . TOTAL KNEE ARTHROPLASTY Left 03/23/2015   Procedure: TOTAL KNEE ARTHROPLASTY;  Surgeon: Newt Minion, MD;  Location: [place];  Service: Orthopedics;  Laterality: Left;  Marland Kitchen VAGINAL HYSTERECTOMY    . VARICOSE VEIN SURGERY Right X 2   Social History   Occupational History  . Disability   . formerly Therapist, sports, [place] History Main Topics  . Smoking status: Former Smoker     Packs/day: 1.00    Years: 8.00    Types: Cigarettes    Quit date: 12/01/1990  . Smokeless tobacco: Never Used     Comment: quit smoking in the 1990s  . Alcohol use No     Comment: 03/23/2015 "stopped drinking in 2012 w/gastric bypass; drank socially before bypass"  . Drug use: No  . Sexual activity: Not Currently    Birth control/ protection: Surgical

## 2016-05-18 ENCOUNTER — Telehealth (INDEPENDENT_AMBULATORY_CARE_PROVIDER_SITE_OTHER): Payer: Self-pay

## 2016-05-18 NOTE — Telephone Encounter (Signed)
HTA policy # is [id number]. Can't submit for auth until 05/22/16.

## 2016-05-18 NOTE — Telephone Encounter (Signed)
Needs HTA for repeat MBB that is scheduled for 05/28/16. Coverage begins on 05/22/16.

## 2016-05-24 ENCOUNTER — Other Ambulatory Visit (INDEPENDENT_AMBULATORY_CARE_PROVIDER_SITE_OTHER): Payer: Self-pay | Admitting: Internal Medicine

## 2016-05-24 NOTE — Telephone Encounter (Signed)
Have been unable to pull pt up on the HTA website for the past 2 days, I called and verified that she does have active coverage, CSR I s/w wasn't able to pull her up either but after looking into she did find her as being active. Said I would have to fax in form for auth until they can get her updated in the system. Faxed form with last 2 office notes and CT scan to [phone].

## 2016-05-25 DIAGNOSIS — M542 Cervicalgia: Secondary | ICD-10-CM | POA: Insufficient documentation

## 2016-05-25 DIAGNOSIS — M7062 Trochanteric bursitis, left hip: Secondary | ICD-10-CM | POA: Insufficient documentation

## 2016-05-25 DIAGNOSIS — M7061 Trochanteric bursitis, right hip: Secondary | ICD-10-CM | POA: Insufficient documentation

## 2016-05-25 HISTORY — DX: Trochanteric bursitis, left hip: M70.62

## 2016-05-25 HISTORY — DX: Trochanteric bursitis, left hip: M70.61

## 2016-05-25 HISTORY — DX: Cervicalgia: M54.2

## 2016-05-25 NOTE — Progress Notes (Signed)
Office Visit Note  Patient: Debbie Pineda             Date of Birth: Apr 30, 1960           MRN: [id number]             PCP: Jake Samples, PA-C Referring: Jake Samples, [place]* Visit Date: 05/30/2016 Occupation: @GUAROCC @    Subjective:  Pain in neck and shoulders   History of Present Illness: Debbie Pineda is a 56 y.o. female with a history of fibromyalgia.  Patient states her fibromyalgia pain is a 7 out of 10 on average.  Patient states she is experiencing pain in bilateral shoulders.  She also has muscle pain and tightness in bilateral trapezius.  Patient states she has pain in her right thumb joint and numbness in her first 3 fingers on her right hand.  She states she has ongoing lower back pain, and she will be seeing Dr. Ernestina Patches for facet injections soon.      Activities of Daily Living:  Patient reports morning stiffness for 2-3 hours.   Patient Reports nocturnal pain.  Difficulty dressing/grooming: Reports Difficulty climbing stairs: Reports Difficulty getting out of chair: Reports Difficulty using hands for taps, buttons, cutlery, and/or writing: Denies   Review of Systems  Constitutional: Positive for fatigue and weakness. Negative for night sweats, weight gain and weight loss.  HENT: Positive for mouth sores and mouth dryness. Negative for nose dryness.   Eyes: Positive for visual disturbance (blurred vision ). Negative for pain, redness, itching and dryness.  Respiratory: Positive for difficulty breathing. Negative for cough and shortness of breath.   Cardiovascular: Negative for chest pain, palpitations, hypertension, irregular heartbeat and swelling in legs/feet.  Gastrointestinal: Positive for constipation. Negative for blood in stool and diarrhea.  Endocrine: Negative for increased urination.  Genitourinary: Negative for painful urination.  Musculoskeletal: Positive for arthralgias, joint pain, joint swelling, myalgias, morning stiffness and  myalgias.  Skin: Negative for color change, rash, nodules/bumps, redness, skin tightness and ulcers.  Allergic/Immunologic: Negative for susceptible to infections.  Neurological: Negative for dizziness and night sweats.  Hematological: Negative for swollen glands.  Psychiatric/Behavioral: Positive for sleep disturbance. Negative for depressed mood. The patient is not nervous/anxious.     PMFS History:  Patient Active Problem List   Diagnosis Date Noted  . Primary osteoarthritis of right knee 05/29/2016  . Primary osteoarthritis of both feet 05/29/2016  . Trochanteric bursitis of both hips 05/25/2016  . Neck pain 05/25/2016  . Chronic low back pain 01/11/2016  . Depression 11/29/2015  . 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 Cerebral infarction ([place]) 10/30/2011  . PFO (patent foramen ovale) 10/30/2011  . Bradycardia 10/30/2011  . Chest pain 10/30/2011  . Hypothyroid   . Thrombophlebitis   . Sleep apnea   . Fibromyalgia   . 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 Morbid obesity ([place]) 11/10/2010    Past Medical History:  Diagnosis Date  . ADD (attention deficit disorder)    takes Adderall daily  . Arthritis    "all over"  . Chronic back pain    "all over"  . Constipation    takes stool softener daily  . Degenerative disk disease   . Degenerative joint disease   . Depression    takes Cymbalta daily for pain per pt  . DVT (deep venous thrombosis) (HCC)    RLE  . Family history of adverse reaction to anesthesia    a family member  woke up during surgery; "think it was my mom"  . Fibromyalgia   . GERD (gastroesophageal reflux disease)   . History of blood clots    superficial  . Hyperglycemia   . Hypothyroid    takes Synthroid daily  . Incomplete emptying of bladder   . Insomnia    takes Trazodone nightly  . Iron deficiency anemia    takes Ferrous Sulfate daily  . Joint pain   . Joint swelling    knees and ankles  . Morbid obesity ([place])   .  Nausea    takes Zofran as needed.Seeing GI doc  . OSA on CPAP   . Osteoarthritis   . PFO (patent foramen ovale)   . Scoliosis   . Stroke [facility]) "several"   right foot weakness; memory issues, black spot right visual field since" (03/23/2015)  . Thrombophlebitis     Family History  Problem Relation Age of Onset  . Heart disease Father   . Cancer Father   . Cancer Mother   . Heart disease Brother   . Cancer Brother   . Heart disease Sister   . Cancer Maternal Grandfather   . Colon cancer Neg Hx    Past Surgical History:  Procedure Laterality Date  . BUNIONECTOMY Right 08/2015  . COLONOSCOPY N/A 03/25/2013   Procedure: COLONOSCOPY;  Surgeon: Rogene Houston, MD;  [facility];  Service: Endoscopy;  Laterality: N/A;  930  . ESOPHAGOGASTRODUODENOSCOPY    . EXPLORATORY LAPAROTOMY     "took fallopian tubes out"  . JOINT REPLACEMENT    . KNEE ARTHROSCOPY Left   . KNEE ARTHROSCOPY W/ ACL RECONSTRUCTION Right    "added pins"  . LAPAROSCOPIC CHOLECYSTECTOMY  ~ 2001  . 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 TOTAL KNEE ARTHROPLASTY Left 03/23/2015  . TOTAL KNEE ARTHROPLASTY Left 03/23/2015   Procedure: TOTAL KNEE ARTHROPLASTY;  Surgeon: Newt Minion, MD;  Location: [facility];  Service: Orthopedics;  Laterality: Left;  Marland Kitchen VAGINAL HYSTERECTOMY    . VARICOSE VEIN SURGERY Right X 2   Social History   Social History Narrative  . No narrative on file     Objective: Vital Signs: BP 122/75 (BP Location: Left Arm, Patient Position: Sitting, Cuff Size: Normal)   Pulse 71   Resp 13   Ht 5\' 5"  (1.651 m)   Wt 215 lb (97.5 kg)   BMI 35.78 kg/m    Physical Exam  Constitutional: She is oriented to person, place, and time. She appears well-developed and well-nourished.  HENT:  Head: Normocephalic and atraumatic.  Eyes: Conjunctivae and EOM are normal.  Neck: Normal range of motion.  Cardiovascular: Normal rate, regular rhythm, normal heart sounds and intact distal pulses.     Pulmonary/Chest: Effort normal and breath sounds normal.  Abdominal: Soft. Bowel sounds are normal.  Lymphadenopathy:    She has no cervical adenopathy.  Neurological: She is alert and oriented to person, place, and time.  Skin: Skin is warm and dry. Capillary refill takes less than 2 seconds.  Psychiatric: She has a normal mood and affect. Her behavior is normal.  Nursing note and vitals reviewed.    Musculoskeletal Exam: C-spine and thoracic spine good range of motion. She has bilateral trapezius spasm. She's discomfort range of motion of her lumbar spine. Shoulder joints elbow joints wrist joint MCPs PIPs DIPs with good range of motion with no synovitis. She had tenderness on palpation of her right CMC joint. Hip joints knee joints ankles  were good range of motion with no synovitis. Fibromyalgia tender points were 16 out of 18 positive.  CDAI Exam: No CDAI exam completed.    Investigation: No additional findings. Office Visit on 03/12/2016  Component Date Value Ref Range Status  . Total Bilirubin 03/12/2016 0.4  0.2 - 1.2 mg/dL Final  . Bilirubin, Direct 03/12/2016 0.1  <=0.2 mg/dL Final  . Indirect Bilirubin 03/12/2016 0.3  0.2 - 1.2 mg/dL Final  . Alkaline Phosphatase 03/12/2016 65  33 - 130 U/L Final  . AST 03/12/2016 16  10 - 35 U/L Final  . ALT 03/12/2016 22  6 - 29 U/L Final  . Total Protein 03/12/2016 6.4  6.1 - 8.1 g/dL Final  . Albumin 03/12/2016 4.1  3.6 - 5.1 g/dL Final   CMP Latest Ref Rng & Units 03/12/2016 09/07/2015 03/21/2015  Glucose 65 - 99 mg/dL - - -  BUN 6 - 20 mg/dL - - -  Creatinine 0.44 - 1.00 mg/dL - - -  Sodium 135 - 145 mmol/L - - -  Potassium 3.5 - 5.1 mmol/L - - -  Chloride 101 - 111 mmol/L - - -  CO2 22 - 32 mmol/L - - -  Calcium 8.9 - 10.3 mg/dL - - -  Total Protein 6.1 - 8.1 g/dL 6.4 6.6 6.6  Total Bilirubin 0.2 - 1.2 mg/dL 0.4 0.5 0.4  Alkaline Phos 33 - 130 U/L 65 89 83  AST 10 - 35 U/L 16 14 21   ALT 6 - 29 U/L 22 16 25    CBC Latest Ref  Rng & Units 03/15/2015 01/30/2013 11/01/2011  WBC 4.0 - 10.5 K/uL 4.6 6.9 7.3  Hemoglobin 12.0 - 15.0 g/dL 12.6 12.8 13.1  Hematocrit 36.0 - 46.0 % 39.0 39.3 38.4  Platelets 150 - 400 K/uL 192 265 212    Imaging: Xr C-arm No Report  Result Date: 05/28/2016 Please see Notes or Procedures tab for imaging impression.  Xr C-arm No Report  Result Date: 05/14/2016 Please see Notes or Procedures tab for imaging impression.   Speciality Comments: No specialty comments available.    Procedures:  Trigger Point Inj Date/Time: 05/30/2016 11:07 AM Performed by: Bo Merino Authorized by: Bo Merino   Consent Given by:  Patient Site marked: the procedure site was marked   Timeout: prior to procedure the correct patient, procedure, and site was verified   Indications:  Muscle spasm and pain Total # of Trigger Points:  2 Location: neck   Needle Size:  27 G Approach:  Dorsal Medications #1:  0.5 mL lidocaine 1 %; 10 mg triamcinolone acetonide 40 MG/ML Medications #2:  0.5 mL lidocaine 1 %; 10 mg triamcinolone acetonide 40 MG/ML Patient tolerance:  Patient tolerated the procedure well with no immediate complications Small Joint Inj Date/Time: 05/30/2016 11:08 AM Performed by: Bo Merino Authorized by: Bo Merino   Consent Given by:  Patient Site marked: the procedure site was marked   Timeout: prior to procedure the correct patient, procedure, and site was verified   Indications:  Pain Location:  Thumb Site:  R thumb CMC Prep: patient was prepped and draped in usual sterile fashion   Needle Size:  27 G Spinal Needle: No   Approach:  Radial Ultrasound Guided: No   Fluoroscopic Guidance: No   Medications:  0.5 mL lidocaine 1 %; 10 mg triamcinolone acetonide 40 MG/ML Aspiration Attempted: Yes   Aspirate amount (mL):  0 Patient tolerance:  Patient tolerated the procedure well with no immediate complications  Allergies: Lyrica [pregabalin]; Flexeril  [cyclobenzaprine hcl]; Morphine and related; Sulfamethoxazole-trimethoprim; Belsomra [suvorexant]; and Tape   Assessment / Plan:     Visit Diagnoses: Fibromyalgia: She continues to have some generalized pain and discomfort.  Neck pain: She's been having bilateral trapezius spasm and discomfort. She requests cortisone injection. After informed consent was obtained the injections were given as described above.  Chronic midline low back pain without sciatica: She's been followed by Dr. Louanne Skye  Primary osteoarthritis of right hand: She's been having discomfort in her right [facility] joint and request a cortisone injection. Which was also given as described above. I will give her a prescription for right CMC brace.  Trochanteric bursitis of both hips: The symptoms are tolerable.  Primary osteoarthritis of right knee: Some discomfort  history of left total knee replacement - March 2017 doing well  Primary osteoarthritis of both feet - Right bunionectomy August 2017 by Dr. Sharol Given  History of gastric bypass, 11/20/2010  History of sleep apnea  History of cerebral infarction  History of thrombophlebitis  PFO (patent foramen ovale)    Orders: Orders Placed This Encounter  Procedures  . Trigger Point Injection  . Small Joint Injection/Arthrocentesis  . DME Other see comment   No orders of the defined types were placed in this encounter.   Face-to-face time spent with patient was 30 minutes. 50% of time was spent in counseling and coordination of care.  Follow-Up Instructions: Return in about 6 months (around 11/30/2016) for FMS, OA.   Bo Merino, MD  Note - This record has been created using Editor, commissioning.  Chart creation errors have been sought, but may not always  have been located. Such creation errors do not reflect on  the standard of medical care.

## 2016-05-28 ENCOUNTER — Encounter (INDEPENDENT_AMBULATORY_CARE_PROVIDER_SITE_OTHER): Payer: Self-pay | Admitting: Physical Medicine and Rehabilitation

## 2016-05-28 ENCOUNTER — Ambulatory Visit (INDEPENDENT_AMBULATORY_CARE_PROVIDER_SITE_OTHER): Payer: BLUE CROSS/BLUE SHIELD

## 2016-05-28 ENCOUNTER — Ambulatory Visit (INDEPENDENT_AMBULATORY_CARE_PROVIDER_SITE_OTHER): Payer: PPO | Admitting: Physical Medicine and Rehabilitation

## 2016-05-28 VITALS — BP 111/73 | HR 65 | Temp 97.8°F

## 2016-05-28 DIAGNOSIS — M47816 Spondylosis without myelopathy or radiculopathy, lumbar region: Secondary | ICD-10-CM | POA: Diagnosis not present

## 2016-05-28 MED ORDER — LIDOCAINE HCL (PF) 1 % IJ SOLN
2.0000 mL | Freq: Once | INTRAMUSCULAR | Status: AC
  Administered 2016-05-28: 2 mL

## 2016-05-28 MED ORDER — METHYLPREDNISOLONE ACETATE 80 MG/ML IJ SUSP
80.0000 mg | Freq: Once | INTRAMUSCULAR | Status: AC
  Administered 2016-05-28: 80 mg

## 2016-05-28 NOTE — Procedures (Signed)
Lumbar Diagnostic Facet Joint Nerve Block with Fluoroscopic Guidance   Patient: Debbie Pineda      Date of Birth: 1960/02/05 MRN: [id number] PCP: Jake Samples, PA-C      Visit Date: 05/28/2016   Universal Protocol:    Date/Time: 05/07/188:15 AM  Consent Given By: the patient  Position: PRONE  Additional Comments: Vital signs were monitored before and after the procedure. Patient was prepped and draped in the usual sterile fashion. The correct patient, procedure, and site was verified.   Injection Procedure Details:  Procedure Site One Meds Administered:  Meds ordered this encounter  Medications  . lidocaine (PF) (XYLOCAINE) 1 % injection 2 mL  . methylPREDNISolone acetate (DEPO-MEDROL) injection 80 mg     Laterality: Bilateral  Location/Site:  L2-L3 L3-L4 L4-L5 L5-S1  Needle size: 22 G  Needle type: spinal needle  Needle Placement: Oblique pedical  Findings:  -Contrast Used: 1 mL iohexol 180 mg iodine/mL   -Comments: Excellent flow across the articular pillars without intravascular flow  Procedure Details: The fluoroscope beam is vertically oriented in AP and then obliqued 15 to 20 degrees to the ipsilateral side of the desired nerve to achieve the "Scotty dog" appearance.  The skin over the target area of the junction of the superior articulating process and the transverse process (sacral ala if blocking the L5 dorsal rami) was locally anesthetized with a 1 ml volume of 1% Lidocaine without Epinephrine.  The spinal needle was inserted and advanced in a trajectory view down to the target.   After contact with periosteum and negative aspirate for blood and CSF, correct placement without intravascular or epidural spread was confirmed by injecting 0.5 ml. of Isovue-250.  A spot radiograph was obtained of this image.    Next, a 0.5 ml. volume of the injectate described above was injected. The needle was then redirected to the other facet joint nerves mentioned  above if needed.  Prior to the procedure, the patient was given a Pain Diary which was completed for baseline measurements.  After the procedure, the patient rated their pain every 30 minutes and will continue rating at this frequency for a total of 5 hours.  The patient has been asked to complete the Diary and return to [place] by mail, fax or hand delivered as soon as possible.   Additional Comments:  The patient tolerated the procedure well Dressing: Band-Aid    Post-procedure details: Patient was observed during the procedure. Post-procedure instructions were reviewed.  Patient left the clinic in stable condition.

## 2016-05-28 NOTE — Patient Instructions (Signed)

## 2016-05-28 NOTE — Progress Notes (Signed)
Debbie Pineda - 56 y.o. female MRN [id number]  Date of birth: 04/19/1960  Office Visit Note: Visit Date: 05/28/2016 PCP: Jake Samples, PA-C Referred by: Jake Samples, PA*  Subjective: Chief Complaint  Patient presents with  . Lower Back - Pain   HPI: Debbie Pineda is a 56 year old female with chronic history of left more than right but bilateral axial low back pain which has failed conservative care including medication management as well as physical therapy chiropractic care. She is followed by Dr. Louanne Skye in our office is a spine surgeon. He requested facet joint/medial branch blocks which we completed recently with a pain diary. She initially got 100% relief for the first hour or so and then steadily declining relief after that. She does make appointment to did help quite a bit for her back pain. Her cases, Haze Rushing by fibromyalgia. We are going to complete a second medial branch block of the lumbar spine including the L2-3, L3-4 L4-5 and L5-S1 facet joints.    ROS Otherwise per HPI.  Assessment & Plan: Visit Diagnoses:  1. Spondylosis without myelopathy or radiculopathy, lumbar region     Plan: Findings:  Bilateral medial branch blocks of the L2-3, L3-4, L4-5 and L5-S1 facet joints. Pain diary given.    Meds & Orders:  Meds ordered this encounter  Medications  . lidocaine (PF) (XYLOCAINE) 1 % injection 2 mL  . methylPREDNISolone acetate (DEPO-MEDROL) injection 80 mg    Orders Placed This Encounter  Procedures  . Facet Injection  . XR C-ARM NO REPORT    Follow-up: Return if symptoms worsen or fail to improve, for Review Pain Diary.   Procedures: No procedures performed  Lumbar Diagnostic Facet Joint Nerve Block with Fluoroscopic Guidance   Patient: Debbie Pineda      Date of Birth: [birth year] MRN: [id number] PCP: Jake Samples, PA-C      Visit Date: 05/28/2016   Universal Protocol:    Date/Time: 05/07/188:15 AM  Consent Given By: the  patient  Position: PRONE  Additional Comments: Vital signs were monitored before and after the procedure. Patient was prepped and draped in the usual sterile fashion. The correct patient, procedure, and site was verified.   Injection Procedure Details:  Procedure Site One Meds Administered:  Meds ordered this encounter  Medications  . lidocaine (PF) (XYLOCAINE) 1 % injection 2 mL  . methylPREDNISolone acetate (DEPO-MEDROL) injection 80 mg     Laterality: Bilateral  Location/Site:  L2-L3 L3-L4 L4-L5 L5-S1  Needle size: 22 G  Needle type: spinal needle  Needle Placement: Oblique pedical  Findings:  -Contrast Used: 1 mL iohexol 180 mg iodine/mL   -Comments: Excellent flow across the articular pillars without intravascular flow  Procedure Details: The fluoroscope beam is vertically oriented in AP and then obliqued 15 to 20 degrees to the ipsilateral side of the desired nerve to achieve the "Scotty dog" appearance.  The skin over the target area of the junction of the superior articulating process and the transverse process (sacral ala if blocking the L5 dorsal rami) was locally anesthetized with a 1 ml volume of 1% Lidocaine without Epinephrine.  The spinal needle was inserted and advanced in a trajectory view down to the target.   After contact with periosteum and negative aspirate for blood and CSF, correct placement without intravascular or epidural spread was confirmed by injecting 0.5 ml. of Isovue-250.  A spot radiograph was obtained of this image.    Next, a 0.5 ml. volume of the  injectate described above was injected. The needle was then redirected to the other facet joint nerves mentioned above if needed.  Prior to the procedure, the patient was given a Pain Diary which was completed for baseline measurements.  After the procedure, the patient rated their pain every 30 minutes and will continue rating at this frequency for a total of 5 hours.  The patient has been  asked to complete the Diary and return to [place] by mail, fax or hand delivered as soon as possible.   Additional Comments:  The patient tolerated the procedure well Dressing: Band-Aid    Post-procedure details: Patient was observed during the procedure. Post-procedure instructions were reviewed.  Patient left the clinic in stable condition.     Clinical History: Lumbar CT myelogram 04/06/2016  IMPRESSION: 1. No acute or focal abnormality to explain the patient's thoracolumbar pain 2. Left paramedian calcified disc protrusion at T12-L1 without significant stenosis. 3. Mild osseous foraminal narrowing bilaterally at T10-11. 4. Mild bilateral foraminal narrowing at L1-2. 5. Mild left subarticular narrowing at L2-3 with moderate left and mild right foraminal stenosis. 6. Mild right subarticular narrowing at L3-4 with mild foraminal narrowing bilaterally, right greater than left. 7. Mild foraminal narrowing at L4-5 with moderate bilateral facet hypertrophy but no significant central canal stenosis. 8. Moderate facet hypertrophy and spurring is worse on the left with mild left subarticular narrowing potentially impacting the left S1 nerve root.  Lumbar spine MRI 01/09/2016  L2-3: Mild retrolisthesis. Mild disc and facet degeneration. Mild foraminal narrowing bilaterally.  L3-4: Mild retrolisthesis. Mild disc and facet degeneration. Mild right foraminal narrowing.  L4-5:  Mild disc and facet degeneration without significant stenosis  L5-S1: Moderate to advanced disc degeneration with disc space narrowing. Mild endplate spurring without significant stenosis.  IMPRESSION: Lumbar disc and facet degeneration is stable since 07/20/2014. Negative for disc protrusion or significant spinal stenosis. Mild right foraminal narrowing at L3-4.  She reports that she quit smoking about 25 years ago. Her smoking use included Cigarettes. She has a 8.00 pack-year smoking history. She has  never used smokeless tobacco. No results for input(s): HGBA1C, LABURIC in the last 8760 hours.  Objective:  VS:  HT:    WT:   BMI:     BP:111/73  HR:65bpm  TEMP:97.8 F (36.6 C)(Oral)  RESP:99 % Physical Exam  Musculoskeletal:  Concordant low back pain with extension rotation of the lumbar spine. No pain over the greater trochanters and good distal strength.    Ortho Exam Imaging: Xr C-arm No Report  Result Date: 05/28/2016 Please see Notes or Procedures tab for imaging impression.   Past Medical/Family/Surgical/Social History: Medications & Allergies reviewed per EMR Patient Active Problem List   Diagnosis Date Noted  . Trochanteric bursitis of both hips 05/25/2016  . Neck pain 05/25/2016  . Chronic low back pain 01/11/2016  . Depression 11/29/2015  . 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 Elevated LFTs 03/17/2012  . Cerebral infarction ([place]) 10/30/2011  . PFO (patent foramen ovale) 10/30/2011  . Bradycardia 10/30/2011  . Chest pain 10/30/2011  . Hypothyroid   . Thrombophlebitis   . Sleep apnea   . Fibromyalgia   . Degenerative joint disease   . 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 Morbid obesity ([place]) 11/10/2010   Past Medical History:  Diagnosis Date  . ADD (attention deficit disorder)    takes Adderall daily  . Arthritis    "all over"  . Chronic back pain    "all over"  .  Constipation    takes stool softener daily  . Degenerative disk disease   . Degenerative joint disease   . Depression    takes Cymbalta daily for pain per pt  . DVT (deep venous thrombosis) ([facility]CC)    RLE  . Family history of adverse reaction to anesthesia    a family member woke up during surgery; "think it was my mom"  . Fibromyalgia   . GERD (gastroesophageal reflux disease)   . History of blood clots    superficial  . Hyperglycemia   . Hypothyroid    takes Synthroid daily  . Incomplete emptying of bladder   . Insomnia    takes Trazodone nightly  . Iron  deficiency anemia    takes Ferrous Sulfate daily  . Joint pain   . Joint swelling    knees and ankles  . Morbid obesity ([place])   . Nausea    takes Zofran as needed.Seeing GI doc  . OSA on CPAP   . Osteoarthritis   . PFO (patent foramen ovale)   . Scoliosis   . Stroke [facility]) "several"   right foot weakness; memory issues, black spot right visual field since" (03/23/2015)  . Thrombophlebitis    Family History  Problem Relation Age of Onset  . Heart disease Father   . Cancer Father   . Heart disease Brother   . Heart disease Sister   . Cancer Maternal Grandfather   . Colon cancer Neg Hx    Past Surgical History:  Procedure Laterality Date  . BUNIONECTOMY Right 08/2015  . COLONOSCOPY N/A 03/25/2013   Procedure: COLONOSCOPY;  Surgeon: Rogene Houston, MD;  [facility];  Service: Endoscopy;  Laterality: N/A;  930  . ESOPHAGOGASTRODUODENOSCOPY    . EXPLORATORY LAPAROTOMY     "took fallopian tubes out"  . JOINT REPLACEMENT    . KNEE ARTHROSCOPY Left   . KNEE ARTHROSCOPY W/ ACL RECONSTRUCTION Right    "added pins"  . LAPAROSCOPIC CHOLECYSTECTOMY  ~ 2001  . 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 TOTAL KNEE ARTHROPLASTY Left 03/23/2015  . TOTAL KNEE ARTHROPLASTY Left 03/23/2015   Procedure: TOTAL KNEE ARTHROPLASTY;  Surgeon: Newt Minion, MD;  Location: [place];  Service: Orthopedics;  Laterality: Left;  Marland Kitchen VAGINAL HYSTERECTOMY    . VARICOSE VEIN SURGERY Right X 2   Social History   Occupational History  . Disability   . formerly Therapist, sports, [place] History Main Topics  . Smoking status: Former Smoker    Packs/day: 1.00    Years: 8.00    Types: Cigarettes    Quit date: 12/01/1990  . Smokeless tobacco: Never Used     Comment: quit smoking in the 1990s  . Alcohol use No     Comment: 03/23/2015 "stopped drinking in 2012 w/gastric bypass; drank socially before bypass"  . Drug use: No  . Sexual activity: Not Currently    Birth control/ protection: Surgical

## 2016-05-28 NOTE — Progress Notes (Deleted)
Lower back pain is better, still worse on left than right. Now having some leg pain both legs. Starts at hip and radiates down. Also having weakness.

## 2016-05-29 DIAGNOSIS — M19072 Primary osteoarthritis, left ankle and foot: Secondary | ICD-10-CM

## 2016-05-29 DIAGNOSIS — M1711 Unilateral primary osteoarthritis, right knee: Secondary | ICD-10-CM | POA: Insufficient documentation

## 2016-05-29 DIAGNOSIS — M19071 Primary osteoarthritis, right ankle and foot: Secondary | ICD-10-CM | POA: Insufficient documentation

## 2016-05-29 HISTORY — DX: Primary osteoarthritis, right ankle and foot: M19.071

## 2016-05-29 HISTORY — DX: Unilateral primary osteoarthritis, right knee: M17.11

## 2016-05-29 HISTORY — DX: Primary osteoarthritis, right ankle and foot: M19.072

## 2016-05-30 ENCOUNTER — Ambulatory Visit (INDEPENDENT_AMBULATORY_CARE_PROVIDER_SITE_OTHER): Payer: PPO | Admitting: Rheumatology

## 2016-05-30 ENCOUNTER — Encounter: Payer: Self-pay | Admitting: Rheumatology

## 2016-05-30 DIAGNOSIS — M1711 Unilateral primary osteoarthritis, right knee: Secondary | ICD-10-CM

## 2016-05-30 DIAGNOSIS — M19041 Primary osteoarthritis, right hand: Secondary | ICD-10-CM

## 2016-05-30 DIAGNOSIS — Z8669 Personal history of other diseases of the nervous system and sense organs: Secondary | ICD-10-CM | POA: Diagnosis not present

## 2016-05-30 DIAGNOSIS — M19072 Primary osteoarthritis, left ankle and foot: Secondary | ICD-10-CM

## 2016-05-30 DIAGNOSIS — M797 Fibromyalgia: Secondary | ICD-10-CM

## 2016-05-30 DIAGNOSIS — Q2112 Patent foramen ovale: Secondary | ICD-10-CM

## 2016-05-30 DIAGNOSIS — Z8673 Personal history of transient ischemic attack (TIA), and cerebral infarction without residual deficits: Secondary | ICD-10-CM

## 2016-05-30 DIAGNOSIS — Z8672 Personal history of thrombophlebitis: Secondary | ICD-10-CM

## 2016-05-30 DIAGNOSIS — Z96652 Presence of left artificial knee joint: Secondary | ICD-10-CM

## 2016-05-30 DIAGNOSIS — M545 Low back pain, unspecified: Secondary | ICD-10-CM

## 2016-05-30 DIAGNOSIS — Z9884 Bariatric surgery status: Secondary | ICD-10-CM | POA: Diagnosis not present

## 2016-05-30 DIAGNOSIS — M7061 Trochanteric bursitis, right hip: Secondary | ICD-10-CM

## 2016-05-30 DIAGNOSIS — G8929 Other chronic pain: Secondary | ICD-10-CM

## 2016-05-30 DIAGNOSIS — M542 Cervicalgia: Secondary | ICD-10-CM

## 2016-05-30 DIAGNOSIS — M19071 Primary osteoarthritis, right ankle and foot: Secondary | ICD-10-CM

## 2016-05-30 DIAGNOSIS — M7062 Trochanteric bursitis, left hip: Secondary | ICD-10-CM

## 2016-05-30 DIAGNOSIS — Q211 Atrial septal defect: Secondary | ICD-10-CM

## 2016-05-30 MED ORDER — TRIAMCINOLONE ACETONIDE 40 MG/ML IJ SUSP
10.0000 mg | INTRAMUSCULAR | Status: AC | PRN
  Administered 2016-05-30: 10 mg via INTRAMUSCULAR

## 2016-05-30 MED ORDER — LIDOCAINE HCL 1 % IJ SOLN
0.5000 mL | INTRAMUSCULAR | Status: AC | PRN
  Administered 2016-05-30: .5 mL

## 2016-05-30 MED ORDER — TRIAMCINOLONE ACETONIDE 40 MG/ML IJ SUSP
10.0000 mg | INTRAMUSCULAR | Status: AC | PRN
  Administered 2016-05-30: 10 mg via INTRA_ARTICULAR

## 2016-05-30 NOTE — Patient Instructions (Addendum)
Supplements for OA Natural anti-inflammatories  You can purchase these at [employer], [employer] or online.  . Turmeric (capsules)  . Ginger (ginger root or capsules)  . Omega 3 (Fish, flax seeds, chia seeds, walnuts, almonds)  . Tart cherry (dried or extract)   Patient should be under the care of a physician while taking these supplements. This may not be reproduced without the permission of Dr. Bo Merino.  Myofascial Pain Syndrome and Fibromyalgia Myofascial pain syndrome and fibromyalgia are both pain disorders. This pain may be felt mainly in your muscles.  Myofascial pain syndrome:  Always has trigger points or tender points in the muscle that will cause pain when pressed. The pain may come and go.  Usually affects your neck, upper back, and shoulder areas. The pain often radiates into your arms and hands.  Fibromyalgia:  Has muscle pains and tenderness that come and go.  Is often associated with fatigue and sleep disturbances.  Has trigger points.  Tends to be long-lasting (chronic), but is not life-threatening. Fibromyalgia and myofascial pain are not the same. However, they often occur together. If you have both conditions, each can make the other worse. Both are common and can cause enough pain and fatigue to make day-to-day activities difficult. What are the causes? The exact causes of fibromyalgia and myofascial pain are not known. People with certain gene types may be more likely to develop fibromyalgia. Some factors can be triggers for both conditions, such as:  Spine disorders.  Arthritis.  Severe injury (trauma) and other physical stressors.  Being under a lot of stress.  A medical illness. What are the signs or symptoms? Fibromyalgia  The main symptom of fibromyalgia is widespread pain and tenderness in your muscles. This can vary over time. Pain is sometimes described as stabbing, shooting, or burning. You may have tingling or numbness,  too. You may also have sleep problems and fatigue. You may wake up feeling tired and groggy (fibro fog). Other symptoms may include:  Bowel and bladder problems.  Headaches.  Visual problems.  Problems with odors and noises.  Depression or mood changes.  Painful menstrual periods (dysmenorrhea).  Dry skin or eyes. Myofascial pain syndrome  Symptoms of myofascial pain syndrome include:  Tight, ropy bands of muscle.  Uncomfortable sensations in muscular areas, such as:  Aching.  Cramping.  Burning.  Numbness.  Tingling.  Muscle weakness.  Trouble moving certain muscles freely (range of motion). How is this diagnosed? There are no specific tests to diagnose fibromyalgia or myofascial pain syndrome. Both can be hard to diagnose because their symptoms are common in many other conditions. Your health care provider may suspect one or both of these conditions based on your symptoms and medical history. Your health care provider will also do a physical exam. The key to diagnosing fibromyalgia is having pain, fatigue, and other symptoms for more than three months that cannot be explained by another condition. The key to diagnosing myofascial pain syndrome is finding trigger points in muscles that are tender and cause pain elsewhere in your body (referred pain). How is this treated? Treating fibromyalgia and myofascial pain often requires a team of health care providers. This usually starts with your primary provider and a physical therapist. You may also find it helpful to work with alternative health care providers, such as massage therapists or acupuncturists. Treatment for fibromyalgia may include medicines. This may include nonsteroidal anti-inflammatory drugs (NSAIDs), along with other medicines. Treatment for myofascial pain may also include:  NSAIDs.  Cooling and stretching of muscles.  Trigger point injections.  Sound wave (ultrasound) treatments to stimulate  muscles. Follow these instructions at home:  Take medicines only as directed by your health care provider.  Exercise as directed by your health care provider or physical therapist.  Try to avoid stressful situations.  Practice relaxation techniques to control your stress. You may want to try:  Biofeedback.  Visual imagery.  Hypnosis.  Muscle relaxation.  Yoga.  Meditation.  Talk to your health care provider about alternative treatments, such as acupuncture or massage treatment.  Maintain a healthy lifestyle. This includes eating a healthy diet and getting enough sleep.  Consider joining a support group.  Do not do activities that stress or strain your muscles. That includes repetitive motions and heavy lifting. Where to find more information:  National Fibromyalgia Association: www.fmaware.[place]: www.arthritis.org  American Chronic Pain Association: OEMDeals.dk Contact a health care provider if:  You have new symptoms.  Your symptoms get worse.  You have side effects from your medicines.  You have trouble sleeping.  Your condition is causing depression or anxiety. This information is not intended to replace advice given to you by your health care provider. Make sure you discuss any questions you have with your health care provider. Document Released: 01/08/2005 Document Revised: 06/16/2015 Document Reviewed: 10/14/2013 Elsevier Interactive Patient Education  2017 [employer].

## 2016-05-31 NOTE — Telephone Encounter (Signed)
Submitted auth request for bilateral RFA

## 2016-06-04 ENCOUNTER — Encounter: Payer: Self-pay | Admitting: Rheumatology

## 2016-06-06 NOTE — Telephone Encounter (Signed)
Received auth for bilateral [id number], [id number], and [id number]. Auth # [id number]. eff 05/24/16-08/22/16. Left message # 1 for pt to call back for scheduling.

## 2016-06-06 NOTE — Progress Notes (Signed)
Study reviewed with patient. jen

## 2016-06-06 NOTE — Telephone Encounter (Signed)
Patient called back and left a message saying that she is having trouble getting calls on her phone. She mentioned a few dates when she would be available and asked [place] to schedule. I called her back and left a message saying that I would put her down for 06/21/16 and 06/28/16 at 330 and I asked her to call back to confirm that these dates were ok.

## 2016-06-21 ENCOUNTER — Ambulatory Visit (INDEPENDENT_AMBULATORY_CARE_PROVIDER_SITE_OTHER): Payer: Self-pay

## 2016-06-21 ENCOUNTER — Ambulatory Visit (INDEPENDENT_AMBULATORY_CARE_PROVIDER_SITE_OTHER): Payer: PPO | Admitting: Physical Medicine and Rehabilitation

## 2016-06-21 ENCOUNTER — Encounter (INDEPENDENT_AMBULATORY_CARE_PROVIDER_SITE_OTHER): Payer: Self-pay | Admitting: Physical Medicine and Rehabilitation

## 2016-06-21 VITALS — BP 102/60 | HR 59

## 2016-06-21 DIAGNOSIS — M545 Low back pain, unspecified: Secondary | ICD-10-CM

## 2016-06-21 DIAGNOSIS — G8929 Other chronic pain: Secondary | ICD-10-CM

## 2016-06-21 DIAGNOSIS — E6609 Other obesity due to excess calories: Secondary | ICD-10-CM | POA: Diagnosis not present

## 2016-06-21 DIAGNOSIS — E063 Autoimmune thyroiditis: Secondary | ICD-10-CM | POA: Diagnosis not present

## 2016-06-21 DIAGNOSIS — I10 Essential (primary) hypertension: Secondary | ICD-10-CM | POA: Diagnosis not present

## 2016-06-21 DIAGNOSIS — R7301 Impaired fasting glucose: Secondary | ICD-10-CM | POA: Diagnosis not present

## 2016-06-21 DIAGNOSIS — Z1389 Encounter for screening for other disorder: Secondary | ICD-10-CM | POA: Diagnosis not present

## 2016-06-21 DIAGNOSIS — M47816 Spondylosis without myelopathy or radiculopathy, lumbar region: Secondary | ICD-10-CM

## 2016-06-21 DIAGNOSIS — M797 Fibromyalgia: Secondary | ICD-10-CM | POA: Diagnosis not present

## 2016-06-21 DIAGNOSIS — E782 Mixed hyperlipidemia: Secondary | ICD-10-CM | POA: Diagnosis not present

## 2016-06-21 DIAGNOSIS — Z6835 Body mass index (BMI) 35.0-35.9, adult: Secondary | ICD-10-CM | POA: Diagnosis not present

## 2016-06-21 DIAGNOSIS — R7309 Other abnormal glucose: Secondary | ICD-10-CM | POA: Diagnosis not present

## 2016-06-21 MED ORDER — LIDOCAINE HCL (PF) 1 % IJ SOLN: 2.0000 mL | Freq: Once | INTRAMUSCULAR | Status: DC

## 2016-06-21 MED ORDER — METHYLPREDNISOLONE ACETATE 80 MG/ML IJ SUSP: 80.0000 mg | Freq: Once | INTRAMUSCULAR | Status: DC

## 2016-06-21 NOTE — Patient Instructions (Signed)

## 2016-06-21 NOTE — Progress Notes (Deleted)
Patient is here today for planned RFA. Left side is worse and would like to start with this side.

## 2016-06-22 NOTE — Procedures (Signed)
Lumbar Facet Joint Nerve Denervation  Patient: Debbie Pineda      Date of Birth: 1960-07-02 MRN: [id number] PCP: Jake Samples, PA-C      Visit Date: 06/21/2016   Debbie Pineda is a pleasant 56 year old female with chronic axial low back pain left more than right. We have seen her on 2 occasions for diagnostic medial branch blocks with a double block paradigm when she received more than 80% relief of her low back symptoms. She has failed conservative care including physical therapy as well as medication management. She is followed by our spine surgeon Dr. Louanne Skye. We are not completely radiofrequency ablation of the left lower facet joints based on multilevel facet arthropathy and prior medial branch blocks.  Universal Protocol:    Date/Time: 06/01/185:44 AM  Consent Given By: the patient  Position: PRONE  Additional Comments: Vital signs were monitored before and after the procedure. Patient was prepped and draped in the usual sterile fashion. The correct patient, procedure, and site was verified.   Injection Procedure Details:  Procedure Site One Meds Administered:  Meds ordered this encounter  Medications  . lidocaine (PF) (XYLOCAINE) 1 % injection 2 mL  . methylPREDNISolone acetate (DEPO-MEDROL) injection 80 mg     Laterality: Left  Location/Site: Left L1, L2, L3 and L4 medial branches and L5 dorsal rami L2-L3 L3-L4 L4-L5 L5-S1  Needle size: 18 G  Needle type: Radiofrequency cannula  Needle Placement: Along juncture of superior articular process and transverse pocess  Findings:  -Comments:  Procedure Details: For each desired target nerve, the corresponding transverse process (sacral ala for the L5 dorsal rami) was identified and the fluoroscope was positioned to square off the endplates of the corresponding vertebral body to achieve a true AP midline view.  The beam was then obliqued 15 to 20 degrees and caudally tilted 15 to 20 degrees to line up a  trajectory along the target nerves. The skin over the target of the junction of superior articulating process and transverse process (sacral ala for the L5 dorsal rami) was infiltrated with 78ml of 1% Lidocaine without Epinephrine.  The 18 gauge 48mm active tip outer cannula was advanced in trajectory view to the target.  This procedure was repeated for each target nerve.  Then, for all levels, the outer cannula placement was fine-tuned and the position was then confirmed with bi-planar imaging.    Test stimulation was done both at sensory and motor levels to ensure there was no radicular stimulation. The target tissues were then infiltrated with 1 ml of 1% Lidocaine without Epinephrine. Subsequently, a percutaneous neurotomy was carried out for 60 seconds at 80 degrees Celsius. The procedure was repeated with the cannula rotated 90 degrees, for duration of 60 seconds, one additional time at each level for a total of two lesions per level.  After the completion of the two lesions, 1 ml of injectate was delivered. It was then repeated for each facet joint nerve mentioned above. Appropriate radiographs were obtained to verify the probe placement during the neurotomy.   Additional Comments:  The patient tolerated the procedure well, patient did have some numbness of the left leg postprocedure but she was discharged able to walk with a small bit of aid. Dressing: Band-Aid    Post-procedure details: Patient was observed during the procedure. Post-procedure instructions were reviewed.  Patient left the clinic in stable condition.

## 2016-06-25 ENCOUNTER — Ambulatory Visit (INDEPENDENT_AMBULATORY_CARE_PROVIDER_SITE_OTHER): Payer: PPO | Admitting: Specialist

## 2016-06-28 ENCOUNTER — Ambulatory Visit (INDEPENDENT_AMBULATORY_CARE_PROVIDER_SITE_OTHER): Payer: Self-pay

## 2016-06-28 ENCOUNTER — Encounter (INDEPENDENT_AMBULATORY_CARE_PROVIDER_SITE_OTHER): Payer: Self-pay | Admitting: Physical Medicine and Rehabilitation

## 2016-06-28 ENCOUNTER — Ambulatory Visit (INDEPENDENT_AMBULATORY_CARE_PROVIDER_SITE_OTHER): Payer: PPO | Admitting: Physical Medicine and Rehabilitation

## 2016-06-28 VITALS — BP 113/65 | HR 74 | Temp 98.1°F

## 2016-06-28 DIAGNOSIS — M47816 Spondylosis without myelopathy or radiculopathy, lumbar region: Secondary | ICD-10-CM

## 2016-06-28 MED ORDER — METHYLPREDNISOLONE ACETATE 80 MG/ML IJ SUSP
80.0000 mg | Freq: Once | INTRAMUSCULAR | Status: AC
  Administered 2016-06-28: 80 mg

## 2016-06-28 MED ORDER — LIDOCAINE HCL (PF) 1 % IJ SOLN
2.0000 mL | Freq: Once | INTRAMUSCULAR | Status: AC
  Administered 2016-06-28: 2 mL

## 2016-06-28 NOTE — Progress Notes (Deleted)
Here for right side RFA. No changes.

## 2016-06-28 NOTE — Patient Instructions (Signed)

## 2016-06-29 NOTE — Procedures (Signed)
Lumbar Facet Joint Nerve Denervation  Patient: Debbie Pineda      Date of Birth: 01-10-1961 MRN: [id number] PCP: Jake Samples, PA-C      Visit Date: 06/28/2016   Mrs. Rigaud is a 56 year old female with chronic severe low back pain which has failed conservative care as documented in our previous notes and Dr. Otho Ket notes and follows her from a spine surgery perspective. She has multilevel facet arthropathy most severe at L4-5 and moderate at all other levels. She is axial low back pain now right worse than left. She is status post left radiofrequency ablation of the L2-3, L3-4, L4-5 and L5-S1 facet joints. Please see our prior notes for further evaluation and justification. We are to complete right sided radiofrequency ablation today.  Universal Protocol:    Date/Time: 06/08/186:16 AM  Consent Given By: the patient  Position: PRONE  Additional Comments: Vital signs were monitored before and after the procedure. Patient was prepped and draped in the usual sterile fashion. The correct patient, procedure, and site was verified.   Injection Procedure Details:  Procedure Site One Meds Administered:  Meds ordered this encounter  Medications  . lidocaine (PF) (XYLOCAINE) 1 % injection 2 mL  . methylPREDNISolone acetate (DEPO-MEDROL) injection 80 mg     Laterality: Right  Location/Site:  L2-L3 L3-L4 L4-L5 L5-S1  Needle size: 18 G  Needle type: Radiofrequency cannula  Needle Placement: Along juncture of superior articular process and transverse pocess  Findings:  -Comments:  Procedure Details: For each desired target nerve, the corresponding transverse process (sacral ala for the L5 dorsal rami) was identified and the fluoroscope was positioned to square off the endplates of the corresponding vertebral body to achieve a true AP midline view.  The beam was then obliqued 15 to 20 degrees and caudally tilted 15 to 20 degrees to line up a trajectory along the target  nerves. The skin over the target of the junction of superior articulating process and transverse process (sacral ala for the L5 dorsal rami) was infiltrated with 64ml of 1% Lidocaine without Epinephrine.  The 18 gauge 21mm active tip outer cannula was advanced in trajectory view to the target.  This procedure was repeated for each target nerve.  Then, for all levels, the outer cannula placement was fine-tuned and the position was then confirmed with bi-planar imaging.    Test stimulation was done both at sensory and motor levels to ensure there was no radicular stimulation. The target tissues were then infiltrated with 1 ml of 1% Lidocaine without Epinephrine. Subsequently, a percutaneous neurotomy was carried out for 60 seconds at 80 degrees Celsius. The procedure was repeated with the cannula rotated 90 degrees, for duration of 60 seconds, one additional time at each level for a total of two lesions per level.  After the completion of the two lesions, 1 ml of injectate was delivered. It was then repeated for each facet joint nerve mentioned above. Appropriate radiographs were obtained to verify the probe placement during the neurotomy.   Additional Comments:  The patient tolerated the procedure well No complications occurred Dressing: Band-Aid    Post-procedure details: Patient was observed during the procedure. Post-procedure instructions were reviewed.  Patient left the clinic in stable condition.

## 2016-07-05 ENCOUNTER — Ambulatory Visit (INDEPENDENT_AMBULATORY_CARE_PROVIDER_SITE_OTHER): Payer: PPO

## 2016-07-05 ENCOUNTER — Encounter (INDEPENDENT_AMBULATORY_CARE_PROVIDER_SITE_OTHER): Payer: Self-pay | Admitting: Orthopedic Surgery

## 2016-07-05 ENCOUNTER — Ambulatory Visit (INDEPENDENT_AMBULATORY_CARE_PROVIDER_SITE_OTHER): Payer: PPO | Admitting: Orthopedic Surgery

## 2016-07-05 VITALS — Ht 65.0 in | Wt 215.0 lb

## 2016-07-05 DIAGNOSIS — M25561 Pain in right knee: Secondary | ICD-10-CM

## 2016-07-05 DIAGNOSIS — M1711 Unilateral primary osteoarthritis, right knee: Secondary | ICD-10-CM

## 2016-07-05 NOTE — Progress Notes (Signed)
Office Visit Note   Patient: Debbie Pineda           Date of Birth: 06/05/1960           MRN: [id number] Visit Date: 07/05/2016              Requested by: Jake Samples, PA-C [address] [place],  [address] PCP: Jake Samples, PA-C  Chief Complaint  Patient presents with  . Right Knee - Pain      HPI: Patient is status post a left total knee arthroplasty. She states she's been having increasing instability in the right knee with giving way valgus alignment and stability difficulty with walking.  Assessment & Plan: Visit Diagnoses:  1. Acute pain of right knee   2. Unilateral primary osteoarthritis, right knee     Plan: Patient states that she would like to proceed with a total knee arthroplasty on the right we will set this up at her convenience. She has requested advanced home care for home health therapy. Risks and benefits of surgery were discussed we will set this up at her convenience.  Follow-Up Instructions: Return in about 2 weeks (around 07/19/2016).   Ortho Exam  Patient is alert, oriented, no adenopathy, well-dressed, normal affect, normal respiratory effort. Examination patient has valgus alignment to the right knee with standing her left knee is straight. She has range of motion from 0-100 of the right knee there is crepitation with range of motion there is instability with a pivot shift on the right knee. She is tender to palpation medial lateral joint line as well as the patellofemoral joint. She has increased laxity of the MCL with valgus stressing. There is no effusion there are no skin lesions.  Imaging: Xr Knee 1-2 Views Right  Result Date: 07/05/2016 2 view radiographs of the right knee shows lateral joint line narrowing with periarticular bony spurs and the lateral joint line as well as in the patellofemoral joint and lateral view. Patient has 2 retained anterior cruciate ligament screws. Her left knee show stable total knee  arthroplasty alignment.   Labs: Lab Results  Component Value Date   HGBA1C 5.4 10/31/2011    Orders:  Orders Placed This Encounter  Procedures  . XR Knee 1-2 Views Right   No orders of the defined types were placed in this encounter.    Procedures: No procedures performed  Clinical Data: No additional findings.  ROS:  All other systems negative, except as noted in the HPI. Review of Systems  Objective: Vital Signs: Ht 5\' 5"  (1.651 m)   Wt 215 lb (97.5 kg)   BMI 35.78 kg/m   Specialty Comments:  No specialty comments available.  PMFS History: Patient Active Problem List   Diagnosis Date Noted  . Unilateral primary osteoarthritis, right knee 05/29/2016  . Primary osteoarthritis of both feet 05/29/2016  . Trochanteric bursitis of both hips 05/25/2016  . Neck pain 05/25/2016  . Chronic low back pain 01/11/2016  . Depression 11/29/2015  . 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 Cerebral infarction ([place]) 10/30/2011  . PFO (patent foramen ovale) 10/30/2011  . Bradycardia 10/30/2011  . Chest pain 10/30/2011  . Hypothyroid   . Thrombophlebitis   . Sleep apnea   . Fibromyalgia   . 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 Morbid obesity ([place]) 11/10/2010   Past Medical History:  Diagnosis Date  . ADD (attention deficit disorder)    takes Adderall daily  . Arthritis    "  all over"  . Chronic back pain    "all over"  . Constipation    takes stool softener daily  . Degenerative disk disease   . Degenerative joint disease   . Depression    takes Cymbalta daily for pain per pt  . DVT (deep venous thrombosis) ([facility])    RLE  . Family history of adverse reaction to anesthesia    a family member woke up during surgery; "think it was my mom"  . Fibromyalgia   . GERD (gastroesophageal reflux disease)   . History of blood clots    superficial  . Hyperglycemia   . Hypothyroid    takes Synthroid daily  . Incomplete emptying of bladder   .  Insomnia    takes Trazodone nightly  . Iron deficiency anemia    takes Ferrous Sulfate daily  . Joint pain   . Joint swelling    knees and ankles  . Morbid obesity ([facility] [place])   . Nausea    takes Zofran as needed.Seeing GI doc  . OSA on CPAP   . Osteoarthritis   . PFO (patent foramen ovale)   . Scoliosis   . Stroke [facility]) "several"   right foot weakness; memory issues, black spot right visual field since" (03/23/2015)  . Thrombophlebitis     Family History  Problem Relation Age of Onset  . Heart disease Father   . Cancer Father   . Cancer Mother   . Heart disease Brother   . Cancer Brother   . Heart disease Sister   . Cancer Maternal Grandfather   . Colon cancer Neg Hx     Past Surgical History:  Procedure Laterality Date  . BUNIONECTOMY Right 08/2015  . COLONOSCOPY N/A 03/25/2013   Procedure: COLONOSCOPY;  Surgeon: Rogene Houston, MD;  [facility];  Service: Endoscopy;  Laterality: N/A;  930  . ESOPHAGOGASTRODUODENOSCOPY    . EXPLORATORY LAPAROTOMY     "took fallopian tubes out"  . JOINT REPLACEMENT    . KNEE ARTHROSCOPY Left   . KNEE ARTHROSCOPY W/ ACL RECONSTRUCTION Right    "added pins"  . LAPAROSCOPIC CHOLECYSTECTOMY  ~ 2001  . 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 TOTAL KNEE ARTHROPLASTY Left 03/23/2015  . TOTAL KNEE ARTHROPLASTY Left 03/23/2015   Procedure: TOTAL KNEE ARTHROPLASTY;  Surgeon: Newt Minion, MD;  Location: [facility];  Service: Orthopedics;  Laterality: Left;  Marland Kitchen VAGINAL HYSTERECTOMY    . VARICOSE VEIN SURGERY Right X 2   Social History   Occupational History  . Disability   . formerly Therapist, sports, [place] History Main Topics  . Smoking status: Former Smoker    Packs/day: 0.75    Years: 8.00    Types: Cigarettes    Quit date: 12/01/1990  . Smokeless tobacco: Never Used     Comment: quit smoking in the 1990s  . Alcohol use No     Comment: 03/23/2015 "stopped drinking in 2012 w/gastric bypass; drank socially before bypass"  . Drug use: No  .  Sexual activity: Not Currently    Birth control/ protection: Surgical

## 2016-07-06 DIAGNOSIS — I639 Cerebral infarction, unspecified: Secondary | ICD-10-CM | POA: Diagnosis not present

## 2016-07-06 DIAGNOSIS — Z6834 Body mass index (BMI) 34.0-34.9, adult: Secondary | ICD-10-CM | POA: Diagnosis not present

## 2016-07-06 DIAGNOSIS — M545 Low back pain: Secondary | ICD-10-CM | POA: Diagnosis not present

## 2016-07-06 DIAGNOSIS — M797 Fibromyalgia: Secondary | ICD-10-CM | POA: Diagnosis not present

## 2016-07-06 DIAGNOSIS — E6609 Other obesity due to excess calories: Secondary | ICD-10-CM | POA: Diagnosis not present

## 2016-07-17 DIAGNOSIS — R51 Headache: Secondary | ICD-10-CM | POA: Diagnosis not present

## 2016-07-17 DIAGNOSIS — Q281 Other malformations of precerebral vessels: Secondary | ICD-10-CM | POA: Diagnosis not present

## 2016-07-17 DIAGNOSIS — I639 Cerebral infarction, unspecified: Secondary | ICD-10-CM | POA: Diagnosis not present

## 2016-07-18 ENCOUNTER — Encounter (INDEPENDENT_AMBULATORY_CARE_PROVIDER_SITE_OTHER): Payer: Self-pay | Admitting: Orthopedic Surgery

## 2016-07-19 ENCOUNTER — Other Ambulatory Visit (INDEPENDENT_AMBULATORY_CARE_PROVIDER_SITE_OTHER): Payer: Self-pay | Admitting: Family

## 2016-07-20 DIAGNOSIS — Z79891 Long term (current) use of opiate analgesic: Secondary | ICD-10-CM | POA: Diagnosis not present

## 2016-07-20 DIAGNOSIS — R413 Other amnesia: Secondary | ICD-10-CM | POA: Diagnosis not present

## 2016-07-20 DIAGNOSIS — R41841 Cognitive communication deficit: Secondary | ICD-10-CM | POA: Diagnosis not present

## 2016-07-20 DIAGNOSIS — M797 Fibromyalgia: Secondary | ICD-10-CM | POA: Diagnosis not present

## 2016-07-24 DIAGNOSIS — G4733 Obstructive sleep apnea (adult) (pediatric): Secondary | ICD-10-CM | POA: Diagnosis not present

## 2016-07-24 DIAGNOSIS — M1711 Unilateral primary osteoarthritis, right knee: Secondary | ICD-10-CM | POA: Diagnosis not present

## 2016-07-26 DIAGNOSIS — Z6834 Body mass index (BMI) 34.0-34.9, adult: Secondary | ICD-10-CM | POA: Diagnosis not present

## 2016-07-26 DIAGNOSIS — E6609 Other obesity due to excess calories: Secondary | ICD-10-CM | POA: Diagnosis not present

## 2016-07-26 DIAGNOSIS — I83811 Varicose veins of right lower extremities with pain: Secondary | ICD-10-CM | POA: Diagnosis not present

## 2016-07-26 DIAGNOSIS — M1711 Unilateral primary osteoarthritis, right knee: Secondary | ICD-10-CM | POA: Diagnosis not present

## 2016-07-26 DIAGNOSIS — G4733 Obstructive sleep apnea (adult) (pediatric): Secondary | ICD-10-CM | POA: Diagnosis not present

## 2016-07-31 ENCOUNTER — Other Ambulatory Visit: Payer: Self-pay | Admitting: Rheumatology

## 2016-07-31 DIAGNOSIS — M797 Fibromyalgia: Secondary | ICD-10-CM

## 2016-07-31 NOTE — Telephone Encounter (Signed)
Last Visit: 05/30/16 Next Visit: 11/30/16  Okay to refill per Dr. Estanislado Pandy

## 2016-08-02 ENCOUNTER — Other Ambulatory Visit (INDEPENDENT_AMBULATORY_CARE_PROVIDER_SITE_OTHER): Payer: Self-pay | Admitting: Orthopedic Surgery

## 2016-08-03 DIAGNOSIS — E6609 Other obesity due to excess calories: Secondary | ICD-10-CM | POA: Diagnosis not present

## 2016-08-03 DIAGNOSIS — I83811 Varicose veins of right lower extremities with pain: Secondary | ICD-10-CM | POA: Diagnosis not present

## 2016-08-03 DIAGNOSIS — Z6834 Body mass index (BMI) 34.0-34.9, adult: Secondary | ICD-10-CM | POA: Diagnosis not present

## 2016-08-03 DIAGNOSIS — M1711 Unilateral primary osteoarthritis, right knee: Secondary | ICD-10-CM | POA: Diagnosis not present

## 2016-08-03 DIAGNOSIS — M797 Fibromyalgia: Secondary | ICD-10-CM | POA: Diagnosis not present

## 2016-08-03 DIAGNOSIS — Z1389 Encounter for screening for other disorder: Secondary | ICD-10-CM | POA: Diagnosis not present

## 2016-08-06 ENCOUNTER — Encounter (HOSPITAL_COMMUNITY): Payer: Self-pay

## 2016-08-06 ENCOUNTER — Encounter (HOSPITAL_COMMUNITY)
Admission: RE | Admit: 2016-08-06 | Discharge: 2016-08-06 | Disposition: A | Discharge status: Home / Self Care | Payer: PPO | Source: Ambulatory Visit | Attending: Orthopedic Surgery | Admitting: Orthopedic Surgery

## 2016-08-06 DIAGNOSIS — K219 Gastro-esophageal reflux disease without esophagitis: Secondary | ICD-10-CM | POA: Diagnosis not present

## 2016-08-06 DIAGNOSIS — Z01812 Encounter for preprocedural laboratory examination: Secondary | ICD-10-CM | POA: Insufficient documentation

## 2016-08-06 DIAGNOSIS — I69351 Hemiplegia and hemiparesis following cerebral infarction affecting right dominant side: Secondary | ICD-10-CM | POA: Diagnosis not present

## 2016-08-06 DIAGNOSIS — Z01818 Encounter for other preprocedural examination: Secondary | ICD-10-CM | POA: Insufficient documentation

## 2016-08-06 DIAGNOSIS — Z87891 Personal history of nicotine dependence: Secondary | ICD-10-CM | POA: Insufficient documentation

## 2016-08-06 DIAGNOSIS — F329 Major depressive disorder, single episode, unspecified: Secondary | ICD-10-CM | POA: Insufficient documentation

## 2016-08-06 DIAGNOSIS — E039 Hypothyroidism, unspecified: Secondary | ICD-10-CM | POA: Diagnosis not present

## 2016-08-06 DIAGNOSIS — R001 Bradycardia, unspecified: Secondary | ICD-10-CM | POA: Diagnosis not present

## 2016-08-06 DIAGNOSIS — M419 Scoliosis, unspecified: Secondary | ICD-10-CM | POA: Insufficient documentation

## 2016-08-06 DIAGNOSIS — Z7901 Long term (current) use of anticoagulants: Secondary | ICD-10-CM | POA: Insufficient documentation

## 2016-08-06 DIAGNOSIS — Z96652 Presence of left artificial knee joint: Secondary | ICD-10-CM | POA: Diagnosis not present

## 2016-08-06 DIAGNOSIS — M797 Fibromyalgia: Secondary | ICD-10-CM | POA: Insufficient documentation

## 2016-08-06 DIAGNOSIS — Z9884 Bariatric surgery status: Secondary | ICD-10-CM | POA: Diagnosis not present

## 2016-08-06 DIAGNOSIS — Z79899 Other long term (current) drug therapy: Secondary | ICD-10-CM | POA: Diagnosis not present

## 2016-08-06 DIAGNOSIS — G4733 Obstructive sleep apnea (adult) (pediatric): Secondary | ICD-10-CM | POA: Insufficient documentation

## 2016-08-06 DIAGNOSIS — M1711 Unilateral primary osteoarthritis, right knee: Secondary | ICD-10-CM | POA: Insufficient documentation

## 2016-08-06 HISTORY — DX: Other complications of anesthesia, initial encounter: T88.59XA

## 2016-08-06 HISTORY — DX: Adverse effect of unspecified anesthetic, initial encounter: T41.45XA

## 2016-08-06 LAB — BASIC METABOLIC PANEL
Anion gap: 7 (ref 5–15)
BUN: 9 mg/dL (ref 6–20)
CO2: 25 mmol/L (ref 22–32)
Calcium: 9.1 mg/dL (ref 8.9–10.3)
Chloride: 108 mmol/L (ref 101–111)
Creatinine, Ser: 1.01 mg/dL — ABNORMAL HIGH (ref 0.44–1.00)
GFR calc Af Amer: >60 mL/min (ref >60)
GFR calc non Af Amer: >60 mL/min (ref >60)
Glucose, Bld: 90 mg/dL (ref 65–99)
Potassium: 5.1 mmol/L (ref 3.5–5.1)
Sodium: 140 mmol/L (ref 135–145)

## 2016-08-06 LAB — CBC
HCT: 38.8 % (ref 36.0–46.0)
Hemoglobin: 12.5 g/dL (ref 12.0–15.0)
MCH: 31.8 pg (ref 26.0–34.0)
MCHC: 32.2 g/dL (ref 30.0–36.0)
MCV: 98.7 fL (ref 78.0–100.0)
Platelets: 224 10*3/uL (ref 150–400)
RBC: 3.93 MIL/uL (ref 3.87–5.11)
RDW: 13.8 % (ref 11.5–15.5)
WBC: 8 10*3/uL (ref 4.0–10.5)

## 2016-08-06 LAB — SURGICAL PCR SCREEN
MRSA, PCR: NEGATIVE
Staphylococcus aureus: POSITIVE — AB

## 2016-08-06 NOTE — Pre-Procedure Instructions (Signed)
Debbie Pineda  08/06/2016      [facility] [place], [place] - [place] [address] [place] [address] Phone: [phone] Fax: [phone]  [facility] [address] [place] - [address] [place] [address] [place] [address] Phone: [phone] Fax: [phone]  [facility] [place], [address] AT [place]. [place] [address] Phone: [phone] Fax: [phone]    Your procedure is scheduled on  Wednesday, July 25th   Report to [facility] Admitting at 7:00 AM             (posted surgery time 9:07 - 10:44 am)   Call this number if you have problems the morning of surgery:  [phone], other questions call [phone] Mon - Fri from 8a-4p   Remember:  Do not eat food or drink liquids after midnight Tuesday.   Take these medicines the morning of surgery with A SIP OF WATER : Cymbalta, Gabapentin, Levothyroxine, Omeprazole, Pain Medication.              NOTE: 4-5 days prior to surgery, STOP TAKING any Vitamins, Herbal Supplements, Anti-inflammatories.              Xarelto will be stopped ___________________________________   Do not wear jewelry, make-up or nail polish.  Do not wear lotions, powders,  perfumes, or deoderant.  Do not shave 48 hours prior to surgery.     Do not bring valuables to the hospital.  [facility] is not responsible for any belongings or valuables.  Contacts, dentures or bridgework may not be worn into surgery.  Leave your suitcase in the car.  After surgery it may be brought to your room.  For patients admitted to the hospital, discharge time will be determined by your treatment team.  Please read over the following fact sheets that you were given. Pain Booklet, MRSA Information and Surgical Site Infection Prevention

## 2016-08-06 NOTE — Progress Notes (Addendum)
Received clarification over Xarelto.  She is to stop it 3 days prior to surgery (LD 7/22)  Patient is aware. Has history of PFO, Cardio is Dr. Myriam Jacobson @ [facility] 04/2016 PCP is Delman Cheadle, PA  LOV 07/2016 Had strokes back in 2008 & 2015.  Slight weakness on right side. Was tested for OSA approx 5 + yrs ago.  Setting is at 9.6

## 2016-08-07 NOTE — Progress Notes (Addendum)
Anesthesia Chart Review: Patient is a 56  year old female scheduled for right TKA, removal ACL screws on 08/15/16 by Dr. Sharol Given.   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 Reported one of her parents "woke up during surgery" and had issues with nausea. She reports that she tends to be hypotensive when NPO and post-anesthesia. BMI is consistent with obesity. Patient is a retired Marine scientist.  - PCP is listed as Delman Cheadle, PA-C with [facility].   - Neurologist has been with [facility]), but it appears she was recently referred to [facility] and is scheduled to see Dr. Margette Fast on 08/14/16.   - Cardiologist is Dr. Shade Flood with [facility] (Care Everywhere), last visit 02/2616. One year follow-up recommended.  According to his notes, previous CVA work-up in 2009 showed "no carotid/vertebral artery atherosclerosis, no atrial fibrillation, and no hypercoagulable state by laboratory evaluation." 10/2011 CVA was in the setting of subtherapeutic INR with history of RLE GSV thrombus. He writes, "She was also seen in consultation by our hematology coagulation group who felt that lifelong anticoaguation and not PFO closure was the best management strategy."  Meds include Cymbalta, ferrous sulfate, folic acid, gabapentin, krill oil, levothyroxine, Linzess, melatonin, Robaxin, Ritalin, Myrbetriq, misoprostol, Prilosec, Ditropan XL, oxycodone, Xarelto 20 mg Q PM, Topamax, tramadol, trazodone, Vayacog. (Per PAT RN, PCP manages Xarelto and gave permission to hold  3 days prior to surgery. Patient instructed for last Xarelto dose before surgery 08/11/16 PM. I notified Cheryl at Dr. Jess Barters office of instructions.)  EKG 08/06/16: SB at 53 bpm. Isolated Q wave in III.  TEE 12/06/11 ([facility]; Care Everywhere):  CONCLUSIONS ------------------------------------------------------------------ 1. Bi directional PFO present, with interatrial aneurysm 2. Normal LVEF 3. No LA/LAA thrombus OR RA/RAA THROMBUS 4. Trivial MR, TR  Stress echo 12/01/10 ([facility]; Care Everywhere): INTERPRETATION --------------------------------------------------------------- Interpretation: Normal Stress Echocardiogram. Note: Trivial TR, PR Normal diastolic function Poor sound transmission; Unable to use imaging agent due to relative contraindication with known PFO No obvious wall motion abnormalities seen despite poor sound transmission  RHC/LHC 11/30/05 (Dr. Jenkins Rouge): IMPRESSION: The patient has no evidence of pulmonary hypertension. Herfilling pressures were normal. There is no significant coronary arterydisease (LM, LAD, CX, RCA "normal"). She has normal LV function.  Carotid U/S 10/31/11: IMPRESSION: Minor carotid intimal thickening. No significant ICA stenosis by ultrasound. Vertebral artery flow was antegrade.  Preoperative labs noted. CBC WNL. Cr 1.01. Glucose 90. AST/ALT normal 03/12/16.   Above reviewed with anesthesiologist Dr. Linna Caprice. He actually called and spoke with Dr. Sheila Oats (see separate notation) and reportedly there are still no plans for PFO closure in the future. Xarelto will be held for 72 hours prior to surgery. If no acute changes then I anticipate that she can proceed as planned.   George Hugh [facility] Short Stay Center/Anesthesiology Phone [phone] 08/09/2016 3:32 PM

## 2016-08-08 ENCOUNTER — Encounter: Payer: Self-pay | Admitting: Surgery

## 2016-08-08 ENCOUNTER — Ambulatory Visit (INDEPENDENT_AMBULATORY_CARE_PROVIDER_SITE_OTHER): Payer: PPO | Admitting: Surgery

## 2016-08-08 VITALS — BP 97/60 | HR 71 | Temp 98.0°F | Ht 65.0 in | Wt 209.4 lb

## 2016-08-08 DIAGNOSIS — I83813 Varicose veins of bilateral lower extremities with pain: Secondary | ICD-10-CM | POA: Diagnosis not present

## 2016-08-08 MED ORDER — UNABLE TO FIND: 0 refills | Status: DC

## 2016-08-08 NOTE — Progress Notes (Signed)
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 She denies any known history of DVT or unilateral leg swelling, though review of her medical record indicates RLE DVT (date unspecified and unclear whether anticoagulated). She was previously advised to wear compression stockings, but doesn't like wearing them (even less likely to wear in the summer) and reports she had a procedure by Dr. Tamala Julian at [facility] which involved elevation and wrap-compression of her lower extremity followed by injection of her veins. This did not seem to improve her pain or varicosities. Patient is scheduled to undergo Right total knee replacement in 1 week and expresses concern regarding safety of surgery with ongoing varicose veins.  PAST MEDICAL HISTORY (PMH):  Past Medical History:  Diagnosis Date  . ADD (attention deficit disorder)    takes Adderall daily  . Arthritis    "all over"  . Cerebral infarction ([place]) 10/30/2011  . Chronic back pain    "all over"  . Chronic low back pain 01/11/2016  . Constipation    takes stool softener daily  . Degenerative disk disease   . Degenerative joint disease   . Depression    takes Cymbalta daily for pain per pt  . DVT (deep venous thrombosis) (HCC)    RLE  . Family history of adverse reaction to anesthesia    a family member woke up during surgery; "think it was my mom"  . Fibromyalgia   . Generalized osteoarthritis of multiple sites 11/04/2013  . GERD (gastroesophageal reflux disease)   . Heart palpitations 12/14/2013  . History of blood clots    superficial  .  Hyperglycemia   . Hypothyroid    takes Synthroid daily  . Incomplete emptying of bladder   . Insomnia    takes Trazodone nightly  . Iron deficiency anemia    takes Ferrous Sulfate daily  . Joint pain   . Joint swelling    knees and ankles  . Morbid obesity ([place])   . Nausea    takes Zofran as needed.Seeing GI doc  . Neck pain 05/25/2016  . OSA on CPAP    tested more than 5 yrs ago.    . Osteoarthritis   . PFO (patent foramen ovale)   . Primary osteoarthritis of both feet 05/29/2016   Right bunionectomy August 2017 by Dr. Sharol Given  . S/P ACL surgery 11/29/2010  . S/P cholecystectomy 11/29/2010  . S/P exploratory laparotomy 11/29/2010  . S/P total hysterectomy and bilateral salpingo-oophorectomy 11/29/2010  . Scoliosis   . Sleep apnea   . Status post bariatric surgery 12/07/2010  . Stroke [facility]) "several"   right foot weakness; memory issues, black spot right visual field since" (03/23/2015)  . Thrombophlebitis   . 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 Trochanteric bursitis of both hips 05/25/2016  . Unilateral primary osteoarthritis, right knee 05/29/2016  . Urinary urgency 04/19/2016  . Vein disorder 11/29/2010     PAST SURGICAL HISTORY [facility]):  Past Surgical History:  Procedure Laterality Date  . BUNIONECTOMY Right 08/2015  . CARDIAC CATHETERIZATION     2008.  "it was fine" (not sure why she had it  done, and doesn't know where)  . COLONOSCOPY N/A 03/25/2013   Procedure: COLONOSCOPY;  Surgeon: Rogene Houston, MD;  Location: [facility];  Service: Endoscopy;  Laterality: N/A;  930  . ESOPHAGOGASTRODUODENOSCOPY    . EXPLORATORY LAPAROTOMY     "took fallopian tubes out"  . JOINT REPLACEMENT    . KNEE ARTHROSCOPY Left   . KNEE ARTHROSCOPY W/ ACL RECONSTRUCTION Right    "added pins"  . LAPAROSCOPIC CHOLECYSTECTOMY  ~ 2001  . 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 TOTAL KNEE ARTHROPLASTY Left 03/23/2015  . TOTAL KNEE ARTHROPLASTY Left 03/23/2015   Procedure: TOTAL KNEE ARTHROPLASTY;   Surgeon: Newt Minion, MD;  Location: [facility];  Service: Orthopedics;  Laterality: Left;  Marland Kitchen VAGINAL HYSTERECTOMY    . VARICOSE VEIN SURGERY Right X 2     MEDICATIONS:  Prior to Admission medications   Medication Sig Start Date End Date Taking? Authorizing Provider  Biotin 10000 MCG TABS Take 1 tablet by mouth daily.   Yes [provider]  Cyanocobalamin (B-12) 2500 MCG SUBL Place 2,500 mcg under the tongue daily.   Yes [provider]  diclofenac (VOLTAREN) 75 MG EC tablet Take 75 mg by mouth 2 (two) times daily.   Yes [provider]  diclofenac sodium (VOLTAREN) 1 % GEL Apply 4 g topically 4 (four) times daily as needed (PAIN). Reported on 03/21/2015   Yes [provider]  DULoxetine (CYMBALTA) 60 MG capsule Take 60 mg by mouth 2 (two) times daily. 01/17/15  Yes [provider]  estradiol (ESTRACE) 0.1 MG/GM vaginal cream Place 1 Applicatorful vaginally at bedtime.   Yes [provider]  ferrous sulfate 325 (65 FE) MG tablet Take 325 mg by mouth daily at 12 noon.   Yes [provider]  folic acid (FOLVITE) 902 MCG tablet Take 400 mcg by mouth daily.   Yes [provider]  gabapentin (NEURONTIN) 600 MG tablet Take 600 mg by mouth 3 (three) times daily.   Yes [provider]  Javier Docker Oil 350 MG CAPS Take 350 mg by mouth at bedtime.   Yes [provider]  levothyroxine (SYNTHROID, LEVOTHROID) 125 MCG tablet Take 125 mcg by mouth daily before breakfast.   Yes [provider]  LINZESS 72 MCG capsule Take 72 mcg by mouth daily as needed (constipation).  11/25/15  Yes [provider]  Melatonin 10 MG TABS Take 20 mg by mouth at bedtime as needed (SLEEP).   Yes [provider]  Menthol, Topical Analgesic, (BIOFREEZE EX) Apply 1 application topically daily as needed (pain).   Yes [provider]  methocarbamol (ROBAXIN) 750 MG tablet TAKE 1 TABLET BY MOUTH THREE TIMES DAILY 07/31/16   Yes [name], [name], MD  methylphenidate (RITALIN) 20 MG tablet Take 20 mg by mouth 3 (three) times daily. Takes at 0800, 1200, 1600   Yes [provider]  mirabegron ER (MYRBETRIQ) 50 MG TB24 tablet Take 50 mg by mouth daily.   Yes [provider]  misoprostol (CYTOTEC) 100 MCG tablet Take 100 mcg by mouth 2 (two) times daily after a meal. Takes with  diclofenac   Yes [provider]  Multiple Minerals-Vitamins (CAL-MAG-ZINC-D PO) Take 1-2 tablets by mouth See admin instructions. Take 2 tablet at 1200 and 1 tablet at 1600  Calcium 1000 mg Vit D 600 iu Magnesium 400 mg Zinc 15 mg   Yes [provider]  Multiple Vitamin (MULTIVITAMIN WITH MINERALS) TABS tablet Take 1 tablet  by mouth daily.   Yes [provider]  omeprazole (PRILOSEC) 40 MG capsule TAKE ONE CAPSULE BY MOUTH TWICE DAILY 05/25/16  Yes Setzer, Terri L, NP  ondansetron (ZOFRAN-ODT) 4 MG disintegrating tablet Take 4 mg by mouth every 6 (six) hours as needed for nausea.  03/02/15  Yes [provider]  oxybutynin (DITROPAN-XL) 10 MG 24 hr tablet Take 10 mg by mouth daily.   Yes [provider]  Oxycodone HCl 10 MG TABS TAKE 1 TYABLET BY MOUTH EVERY 4-6 HOURS AS NEEDED FOR PAIN 03/05/15  Yes [provider]  Phosphatidylserine-DHA-EPA (VAYACOG) 100-19.5-6.5 MG CAPS Take 1 capsule by mouth daily. Reported on 03/21/2015   Yes [provider]  psyllium (METAMUCIL) 58.6 % packet Take 1 packet by mouth 2 (two) times daily.   Yes [provider]  rivaroxaban (XARELTO) 20 MG TABS tablet Take 1 tablet (20 mg total) by mouth daily with supper. Patient taking differently: Take 20 mg by mouth at bedtime.  03/25/15  Yes Newt Minion, MD  simethicone (MYLICON) 80 MG chewable tablet Chew 80 mg by mouth as needed for flatulence. Reported on 03/21/2015   Yes [provider]  topiramate (TOPAMAX) 25 MG tablet Take 25 mg by mouth 2 (two) times daily.   Yes [provider]  traMADol (ULTRAM) 50 MG tablet Take 50 mg by mouth every 6 (six) hours as needed (FOR BREAK THROUGH PAIN).  10/13/15  Yes [provider]  traZODone (DESYREL) 100 MG tablet Take 100-300 mg by mouth at bedtime as needed for sleep (depends on insomnia if takes 1-3 tablets).    Yes [provider]  vitamin C (ASCORBIC ACID) 500 MG tablet Take 500 mg by mouth daily.   Yes [provider]  Vitamin D, Ergocalciferol, (DRISDOL) 50000 UNITS CAPS Take 50,000 Units by mouth every 7 (seven) days. Mondays   Yes [provider]  UNABLE TO FIND Bilateral Knee high 20-30 mmHg compression stockings. 08/08/16   Vickie Epley, MD     ALLERGIES:  Allergies  Allergen Reactions  . Lyrica [Pregabalin] Other (See Comments)    SOB, lower extremity edema and weight gain  . Flexeril [Cyclobenzaprine Hcl] Other (See Comments)    Numbness of extremities  . Morphine And Related Itching    Upper torso  . Sulfamethoxazole-Trimethoprim Itching and Rash    Bactrim  . Belsomra [Suvorexant] Other (See Comments)    unknown  . Tape Itching and Rash    Please use "paper" tape Rash if left on longer than 24 hrs     SOCIAL HISTORY:  Social History   Social History  . Marital status: Married    Spouse name: N/A  . Number of children: N/A  . Years of education: N/A   Occupational History  . Disability   . formerly Therapist, sports, [employer] History Main Topics  . Smoking status: Former Smoker    Packs/day: 0.75    Years: 8.00    Types: Cigarettes    Quit date: 12/01/1990  . Smokeless tobacco: Never Used     Comment: quit smoking in the 1990s  . Alcohol use No     Comment: 03/23/2015 "stopped drinking in 2012 w/gastric bypass; drank socially before bypass"  . Drug use: No  . Sexual activity: Not Currently    Birth control/ protection: Surgical   Other Topics Concern  . Not on file   Social History Narrative  . No narrative on file  The patient currently  resides (home / rehab facility / nursing home): Home  The patient normally is (ambulatory / bedbound): Ambulatory   FAMILY HISTORY:  Family History  Problem Relation Age of Onset  . Heart disease Father   . Cancer Father   . Cancer Mother   . Heart disease Brother   . Cancer Brother   . Heart disease Sister   . Cancer Maternal Grandfather   . Colon cancer Neg Hx     Otherwise negative/non-contributory.  REVIEW OF SYSTEMS:  Constitutional: denies any other weight loss, fever, chills, or sweats  Eyes: denies any other vision changes, history of eye injury  ENT: denies sore throat, hearing problems  Respiratory: denies shortness of breath, wheezing  Cardiovascular: denies chest pain, palpitations  Gastrointestinal: denies abdominal pain, N/V, or diarrhea Musculoskeletal: denies any other joint pains or cramps  Skin: Denies any other rashes or skin discolorations  Neurological: denies any other headache, dizziness, weakness  Psychiatric: Denies any other depression, anxiety   All other review of systems were otherwise negative   VITAL SIGNS:  BP 97/60   Pulse 71   Temp 98 F (36.7 C) (Oral)   Ht 5\' 5"  (1.651 m)   Wt 209 lb 6.4 oz (95 kg)   BMI 34.85 kg/m   PHYSICAL EXAM:  Constitutional:  -- Obese body habitus  -- Awake, alert, and oriented x3  Eyes:  -- Pupils equally round and reactive to light  -- No scleral icterus  Ear, nose, throat:  -- No jugular venous distension -- No nasal drainage, bleeding Pulmonary:  -- No crackles  -- Equal breath sounds bilaterally -- Breathing non-labored at rest Cardiovascular:  -- S1, S2 present  -- No pericardial rubs  Gastrointestinal:  -- Abdomen soft, nontender, nondistended, no guarding/rebound  -- No abdominal masses appreciated, pulsatile or otherwise  Musculoskeletal and Integumentary:  -- Wounds or skin discoloration: No wounds appreciated -- Extremities: B/L UE and LE FROM, hands and feet warm, B/L 1+ edema with  RLE > LLE tender varicose veins without erythema along course of patient's greater saphenous vein, none along posterior calf (small/short saphenous vein) Neurologic:  -- Motor function: Intact and symmetric -- Sensation: Intact and symmetric  Labs:  CBC:  Lab Results  Component Value Date   WBC 8.0 08/06/2016   RBC 3.93 08/06/2016   BMP:  Lab Results  Component Value Date   GLUCOSE 90 08/06/2016   CO2 25 08/06/2016   BUN 9 08/06/2016   CREATININE 1.01 (H) 08/06/2016   CREATININE 0.89 01/30/2013   CALCIUM 9.1 08/06/2016     Imaging studies:  Bilateral Lower Extremity Venous Duplex Ultrasound (05/04/2013) Flow in the venous structures of both lower extremities is spontaneous and phasic in all segments. There is normal compression and augmentation in the venous structures of both lower extremities. Venous Doppler signal is normal in all regions bilaterally. There is no thrombus in the deep or visualized superficial venous structures of either lower extremity. There is no deep venous incompetence in either lower extremity.  Assessment/Plan:  56 y.o. 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   - B/L knee-high 20-20 mmHg compression stockings (prescribed)  - B/L LE venous reflux study when patient willing and able to wear compression stockings (upcoming knee surgery)  - okay to proceed with total knee replacement from perspective of varicose veins with  peri-operative lower extremity compression as per operating orthopedic surgeon, slightly elevated risk of prolonged bleeding due to venous hypertension  - importance of weight loss for and beyond patient's varicose veins also discussed  - follow up in clinic following compression stockings and venous reflux study  - may need to refer patient for GSV ablation  All of the above recommendations were discussed with the patient, and all of patient's questions were answered to her expressed satisfaction.  Thank you for the opportunity to participate in this patient's care.  -- Marilynne Drivers Rosana Hoes, MD, [place]: [facility] - Partnering for exceptional care. Office: [phone]

## 2016-08-08 NOTE — Patient Instructions (Signed)
Please call our office when you decide to start wearing the compression stockings. We will schedule an ultrasound once you let us know.  Please call with any questions or concerns.

## 2016-08-09 ENCOUNTER — Encounter (HOSPITAL_COMMUNITY): Payer: Self-pay

## 2016-08-10 NOTE — Consult Note (Signed)
Anesthesiology Note:  As noted, Debbie Pineda has a history of two previous strokes and a PFO with an atrial septal aneurysm and bidirectional flow on with color doppler by TEE. I spoke with her cardiologist, Dr. Shade Flood from [facility]. Dr. Sheila Oats believed that there was no indication for PFO closure prior to knee replacement. Will plan to hold Xarelto > 72 hours prior to surgery.  Roberts Gaudy

## 2016-08-14 ENCOUNTER — Ambulatory Visit (INDEPENDENT_AMBULATORY_CARE_PROVIDER_SITE_OTHER): Payer: PPO | Admitting: Neurology

## 2016-08-14 ENCOUNTER — Encounter: Payer: Self-pay | Admitting: Neurology

## 2016-08-14 DIAGNOSIS — N9489 Other specified conditions associated with female genital organs and menstrual cycle: Secondary | ICD-10-CM | POA: Diagnosis not present

## 2016-08-14 DIAGNOSIS — R413 Other amnesia: Secondary | ICD-10-CM | POA: Insufficient documentation

## 2016-08-14 DIAGNOSIS — I679 Cerebrovascular disease, unspecified: Secondary | ICD-10-CM | POA: Insufficient documentation

## 2016-08-14 DIAGNOSIS — N3941 Urge incontinence: Secondary | ICD-10-CM | POA: Diagnosis not present

## 2016-08-14 HISTORY — DX: Other amnesia: R41.3

## 2016-08-14 HISTORY — DX: Cerebrovascular disease, unspecified: I67.9

## 2016-08-14 NOTE — Progress Notes (Signed)
Reason for visit: Cerebrovascular disease, memory disorder  Referring physician: Dr. Beverly Sessions is a 56 y.o. female  History of present illness:  Ms. Hew is a 56 year old white female with a history of cerebrovascular disease. The patient indicates that around 2008 she began noting some problems with concentration and cognitive processing. The patient was subsequently told that she had an unrecognized stroke event. The patient had a stroke that occurred in 2013 in October involving the left posterior cerebral artery distribution that resulted in an infarction in the mesial portion of the left temporal lobe. The patient has had some visual disturbance primarily in the right upper quadrant that has been a persistent deficit following the stroke. The patient still is able to operate a motor vehicle. The patient has been followed through multiple doctors in multiple medical systems, her neurologist is apparently leaving, and she comes to this office to resume care. The patient has a history of DVTs involving the legs, she has a patent foramen ovale and she is on chronic Xarelto therapy. The patient indicates that she has never undergone a hypercoagulable state workup. The patient has also been followed through podiatry for foot pain that was felt secondary to degenerative arthritis, she also has pain below the knees bilaterally but also has bilateral knee arthritis, she has had a left total knee replacement and will be going for a right total knee replacement tomorrow. This will be done through Dr. Sharol Given. The patient is followed for chronic low back pain by Dr. Ernestina Patches, she has undergone epidural injections and ablation treatments for her back pain. The patient has been followed by Dr. Estanislado Pandy for fibromyalgia, she takes 4-6 of the 10 mg oxycodone tablets a day and she is on Cymbalta. The patient in the past has done well on prednisone, but this medication cannot be continued. The  patient indicates that over the last several years she has had some progression of memory problems and word finding problems. She reports no focal numbness or weakness on the arms or legs, she does have occasional headaches and some mild gait instability with occasional falls. She does have some urinary urgency and stress incontinence of the bladder at times. She denies problems with choking with swallowing. She comes to the office today for an evaluation.  Past Medical History:  Diagnosis Date  . ADD (attention deficit disorder)    takes Adderall daily  . Arthritis    "all over"  . Cerebral infarction ([place]) 10/30/2011  . Chronic back pain    "all over"  . Chronic low back pain 01/11/2016  . Complication of anesthesia    tends to have hypotension when NPO and post-anesthesia  . Constipation    takes stool softener daily  . Degenerative disk disease   . Degenerative joint disease   . Depression    takes Cymbalta daily for pain per pt  . DVT (deep venous thrombosis) ([facility])    RLE  . Family history of adverse reaction to anesthesia    a family member woke up during surgery; "think it was my mom"  . Fibromyalgia   . Generalized osteoarthritis of multiple sites 11/04/2013  . GERD (gastroesophageal reflux disease)   . Heart palpitations 12/14/2013  . History of blood clots    superficial  . Hyperglycemia   . Hypothyroid    takes Synthroid daily  . Incomplete emptying of bladder   . Insomnia    takes Trazodone nightly  . Iron deficiency anemia  takes Ferrous Sulfate daily  . Joint pain   . Joint swelling    knees and ankles  . Morbid obesity ([place])   . Nausea    takes Zofran as needed.Seeing GI doc  . Neck pain 05/25/2016  . OSA on CPAP    tested more than 5 yrs ago.    . Osteoarthritis   . PFO (patent foramen ovale)   . Primary osteoarthritis of both feet 05/29/2016   Right bunionectomy August 2017 by Dr. Sharol Given  . S/P ACL surgery 11/29/2010  . S/P cholecystectomy 11/29/2010  .  S/P exploratory laparotomy 11/29/2010  . S/P total hysterectomy and bilateral salpingo-oophorectomy 11/29/2010  . Scoliosis   . Sleep apnea   . Status post bariatric surgery 12/07/2010  . Stroke [facility]) "several"   right foot weakness; memory issues, black spot right visual field since" (03/23/2015)  . Thrombophlebitis   . 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 Trochanteric bursitis of both hips 05/25/2016  . Unilateral primary osteoarthritis, right knee 05/29/2016  . Urinary urgency 04/19/2016  . Vein disorder 11/29/2010    Past Surgical History:  Procedure Laterality Date  . BUNIONECTOMY Right 08/2015  . CARDIAC CATHETERIZATION     2008.  "it was fine" (not sure why she had it done, and doesn't know where)  . COLONOSCOPY N/A 03/25/2013   Procedure: COLONOSCOPY;  Surgeon: Rogene Houston, MD;  Location[facility];  Service: Endoscopy;  Laterality: N/A;  930  . ESOPHAGOGASTRODUODENOSCOPY    . EXPLORATORY LAPAROTOMY     "took fallopian tubes out"  . JOINT REPLACEMENT    . KNEE ARTHROSCOPY Left   . KNEE ARTHROSCOPY W/ ACL RECONSTRUCTION Right    "added pins"  . LAPAROSCOPIC CHOLECYSTECTOMY  ~ 2001  . 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 TOTAL KNEE ARTHROPLASTY Left 03/23/2015  . TOTAL KNEE ARTHROPLASTY Left 03/23/2015   Procedure: TOTAL KNEE ARTHROPLASTY;  Surgeon: Newt Minion, MD;  Location: [facility];  Service: Orthopedics;  Laterality: Left;  Marland Kitchen VAGINAL HYSTERECTOMY    . VARICOSE VEIN SURGERY Right X 2    Family History  Problem Relation Age of Onset  . Heart disease Father   . Cancer Father   . Cancer Mother   . Heart disease Brother   . Cancer Brother   . Heart disease Sister   . Cancer Maternal Grandfather   . Colon cancer Neg Hx     Social history:  reports that she quit smoking about 25 years ago. Her smoking use included Cigarettes. She has a 6.00 pack-year smoking history. She has never used smokeless tobacco. She reports that she does not drink alcohol or use  drugs.  Medications:  Prior to Admission medications   Medication Sig Start Date End Date Taking? Authorizing Provider  Biotin 10000 MCG TABS Take 1 tablet by mouth daily.   Yes [provider]  Cyanocobalamin (B-12) 2500 MCG SUBL Place 2,500 mcg under the tongue daily.   Yes [provider]  diclofenac (VOLTAREN) 75 MG EC tablet Take 75 mg by mouth 2 (two) times daily.   Yes [provider]  diclofenac sodium (VOLTAREN) 1 % GEL Apply 4 g topically 4 (four) times daily as needed (PAIN). Reported on 03/21/2015   Yes [provider]  DULoxetine (CYMBALTA) 60 MG capsule Take 60 mg by mouth 2 (two) times daily. 01/17/15  Yes [provider]  estradiol (ESTRACE) 0.1 MG/GM vaginal cream Place 1 Applicatorful vaginally at bedtime.   Yes  [provider]  ferrous sulfate 325 (65 FE) MG tablet Take 325 mg by mouth daily at 12 noon.   Yes [provider]  folic acid (FOLVITE) 784 MCG tablet Take 400 mcg by mouth daily.   Yes [provider]  gabapentin (NEURONTIN) 600 MG tablet Take 600 mg by mouth 3 (three) times daily.   Yes [provider]  Javier Docker Oil 350 MG CAPS Take 350 mg by mouth at bedtime.   Yes [provider]  levothyroxine (SYNTHROID, LEVOTHROID) 125 MCG tablet Take 125 mcg by mouth daily before breakfast.   Yes [provider]  LINZESS 72 MCG capsule Take 72 mcg by mouth daily as needed (constipation).  11/25/15  Yes [provider]  Melatonin 10 MG TABS Take 20 mg by mouth at bedtime as needed (SLEEP).   Yes [provider]  Menthol, Topical Analgesic, (BIOFREEZE EX) Apply 1 application topically daily as needed (pain).   Yes [provider]  methocarbamol (ROBAXIN) 750 MG tablet TAKE 1 TABLET BY MOUTH THREE TIMES DAILY 07/31/16  Yes [name], [name], MD  methylphenidate (RITALIN) 20 MG tablet Take 20 mg by mouth 3 (three) times daily. Takes at 0800, 1200, 1600   Yes  [provider]  mirabegron ER (MYRBETRIQ) 50 MG TB24 tablet Take 50 mg by mouth daily.   Yes [provider]  misoprostol (CYTOTEC) 100 MCG tablet Take 100 mcg by mouth 2 (two) times daily after a meal. Takes with  diclofenac   Yes [provider]  Multiple Minerals-Vitamins (CAL-MAG-ZINC-D PO) Take 1-2 tablets by mouth See admin instructions. Take 2 tablet at 1200 and 1 tablet at 1600  Calcium 1000 mg Vit D 600 iu Magnesium 400 mg Zinc 15 mg   Yes [provider]  Multiple Vitamin (MULTIVITAMIN WITH MINERALS) TABS tablet Take 1 tablet by mouth 2 (two) times daily. One a day petites   Yes [provider]  mupirocin ointment (BACTROBAN) 2 %  08/06/16  Yes [provider]  omeprazole (PRILOSEC) 40 MG capsule TAKE ONE CAPSULE BY MOUTH TWICE DAILY 05/25/16  Yes Setzer, Terri L, NP  ondansetron (ZOFRAN-ODT) 4 MG disintegrating tablet Take 4 mg by mouth every 6 (six) hours as needed for nausea.  03/02/15  Yes [provider]  oxybutynin (DITROPAN-XL) 10 MG 24 hr tablet Take 10 mg by mouth daily.   Yes [provider]  Oxycodone HCl 10 MG TABS TAKE 1 TYABLET BY MOUTH EVERY 4-6 HOURS AS NEEDED FOR PAIN 03/05/15  Yes [provider]  Phosphatidylserine-DHA-EPA (VAYACOG) 100-19.5-6.5 MG CAPS Take 1 capsule by mouth daily. Reported on 03/21/2015   Yes [provider]  rivaroxaban (XARELTO) 20 MG TABS tablet Take 1 tablet (20 mg total) by mouth daily with supper. Patient taking differently: Take 20 mg by mouth at bedtime.  03/25/15  Yes Newt Minion, MD  simethicone (MYLICON) 80 MG chewable tablet Chew 80 mg by mouth as needed for flatulence. Reported on 03/21/2015   Yes [provider]  topiramate (TOPAMAX) 25 MG tablet Take 25 mg by mouth 2 (two) times daily.   Yes [provider]  traMADol (ULTRAM) 50 MG tablet Take 50 mg by mouth every 6 (six) hours as needed (FOR BREAK THROUGH PAIN).  10/13/15  Yes  [provider]  traZODone (DESYREL) 100 MG tablet Take 100-300 mg by mouth at bedtime as needed for sleep (depends on insomnia if takes 1-3 tablets).    Yes [provider]  UNABLE TO FIND Bilateral Knee high 20-30 mmHg compression stockings. 08/08/16  Yes Vickie Epley, MD  vitamin C (ASCORBIC ACID) 500 MG tablet Take 500 mg by mouth daily.   Yes [provider]  Vitamin D, Ergocalciferol, (DRISDOL) 50000 UNITS CAPS Take 50,000 Units by mouth every 7 (seven) days. Mondays   Yes [provider]  Wheat Dextrin (BENEFIBER PO) Take 1 Dose by mouth daily.   Yes [provider]      Allergies  Allergen Reactions  . Lyrica [Pregabalin] Other (See Comments)    SOB, lower extremity edema and weight gain  . Flexeril [Cyclobenzaprine Hcl] Other (See Comments)    Numbness of extremities  . Morphine And Related Itching    Upper torso  . Sulfamethoxazole-Trimethoprim Itching and Rash    Bactrim  . Belsomra [Suvorexant] Other (See Comments)    unknown  . Tape Itching and Rash    Please use "paper" tape Rash if left on longer than 24 hrs    ROS:  Out of a complete 14 system review of symptoms, the patient complains only of the following symptoms, and all other reviewed systems are negative.  Memory disturbance Joint pain, back pain Gait instability  Blood pressure 99/63, pulse 69, height 5\' 5"  (1.651 m), weight 209 lb (94.8 kg).  Physical Exam  General: The patient is alert and cooperative at the time of the examination. The patient is moderately obese.  Eyes: Pupils are equal, round, and reactive to light. Discs are flat bilaterally.  Neck: The neck is supple, no carotid bruits are noted.  Respiratory: The respiratory examination is clear.  Cardiovascular: The cardiovascular examination reveals a regular rate and rhythm, no obvious murmurs or rubs are noted.  Skin: Extremities are without significant edema.  Neurologic  Exam  Mental status: The patient is alert and oriented x 3 at the time of the examination. The patient has apparent normal recent and remote memory, with an apparently normal attention span and concentration ability. Mini-Mental Status Examination done today shows a total score of 30/30.  Cranial nerves: Facial symmetry is present. There is good sensation of the face to pinprick and soft touch bilaterally. The strength of the facial muscles and the muscles to head turning and shoulder shrug are normal bilaterally. Speech is well enunciated, no aphasia or dysarthria is noted. Extraocular movements are full. Visual fields are on the left homonymous field, the patient has a relative right superior quadrantanopsia, some impairment of vision as well in the lower quadrant on the right. The tongue is midline, and the patient has symmetric elevation of the soft palate. No obvious hearing deficits are noted.  Motor: The motor testing reveals 5 over 5 strength of all 4 extremities. Good symmetric motor tone is noted throughout.  Sensory: Sensory testing is intact to pinprick, soft touch, vibration sensation, and position sense on all 4 extremities. No evidence of a stocking pattern pinprick sensory deficit was noted. No evidence of extinction is noted.  Coordination: Cerebellar testing reveals good finger-nose-finger and heel-to-shin bilaterally.  Gait and station: Gait is slightly wide-based, the patient usually uses a cane for ambulation. Tandem gait is slightly unsteady.. Romberg is negative. No drift is seen.  Reflexes: Deep tendon reflexes are symmetric, but are depressed bilaterally. Toes are downgoing bilaterally.   Assessment/Plan:  1. History of cerebrovascular disease, left posterior cerebral artery distribution stroke  2. Fibromyalgia  3. Chronic low back pain  4. Reported memory disturbance  The patient indicates that she  believes that her memory problems are worsening over time. We will  check blood work today, the patient is going to have knee surgery on the right knee tomorrow, we will consider repeating a MRI of the brain when she is seen back in the office in 4-5 months. We may consider a medication for memory, the patient currently is on Vyacog. The patient is followed through multiple physicians spread out over multiple locations in multiple medical systems, the patient is trying to consolidate her care at this time.  Jill Alexanders MD 08/14/2016 9:57 AM  [facility] [address] [place], [place] [address]  Phone [phone] Fax [phone]

## 2016-08-15 ENCOUNTER — Encounter (HOSPITAL_COMMUNITY)
Admission: RE | Disposition: A | Discharge status: Home / Self Care | Payer: Self-pay | Source: Ambulatory Visit | Attending: Orthopedic Surgery

## 2016-08-15 ENCOUNTER — Inpatient Hospital Stay (HOSPITAL_COMMUNITY)
Admission: RE | Admit: 2016-08-15 | Discharge: 2016-08-17 | DRG: 470 | Disposition: A | Discharge status: Home / Self Care | Payer: PPO | Source: Ambulatory Visit | Attending: Orthopedic Surgery | Admitting: Orthopedic Surgery

## 2016-08-15 ENCOUNTER — Encounter (HOSPITAL_COMMUNITY): Payer: Self-pay

## 2016-08-15 ENCOUNTER — Inpatient Hospital Stay (HOSPITAL_COMMUNITY): Payer: PPO | Admitting: Anesthesiology

## 2016-08-15 ENCOUNTER — Inpatient Hospital Stay (HOSPITAL_COMMUNITY): Payer: PPO | Admitting: Vascular Surgery

## 2016-08-15 DIAGNOSIS — M1991 Primary osteoarthritis, unspecified site: Secondary | ICD-10-CM | POA: Diagnosis present

## 2016-08-15 DIAGNOSIS — M1711 Unilateral primary osteoarthritis, right knee: Secondary | ICD-10-CM | POA: Diagnosis not present

## 2016-08-15 DIAGNOSIS — M19071 Primary osteoarthritis, right ankle and foot: Secondary | ICD-10-CM | POA: Diagnosis present

## 2016-08-15 DIAGNOSIS — G4733 Obstructive sleep apnea (adult) (pediatric): Secondary | ICD-10-CM | POA: Diagnosis present

## 2016-08-15 DIAGNOSIS — Z888 Allergy status to other drugs, medicaments and biological substances status: Secondary | ICD-10-CM

## 2016-08-15 DIAGNOSIS — Z6834 Body mass index (BMI) 34.0-34.9, adult: Secondary | ICD-10-CM

## 2016-08-15 DIAGNOSIS — M797 Fibromyalgia: Secondary | ICD-10-CM | POA: Diagnosis not present

## 2016-08-15 DIAGNOSIS — G8929 Other chronic pain: Secondary | ICD-10-CM | POA: Diagnosis present

## 2016-08-15 DIAGNOSIS — Z9884 Bariatric surgery status: Secondary | ICD-10-CM | POA: Diagnosis not present

## 2016-08-15 DIAGNOSIS — Z8249 Family history of ischemic heart disease and other diseases of the circulatory system: Secondary | ICD-10-CM

## 2016-08-15 DIAGNOSIS — Z90710 Acquired absence of both cervix and uterus: Secondary | ICD-10-CM

## 2016-08-15 DIAGNOSIS — Z96652 Presence of left artificial knee joint: Secondary | ICD-10-CM | POA: Diagnosis not present

## 2016-08-15 DIAGNOSIS — M19072 Primary osteoarthritis, left ankle and foot: Secondary | ICD-10-CM | POA: Diagnosis present

## 2016-08-15 DIAGNOSIS — Z9889 Other specified postprocedural states: Secondary | ICD-10-CM

## 2016-08-15 DIAGNOSIS — Z8672 Personal history of thrombophlebitis: Secondary | ICD-10-CM

## 2016-08-15 DIAGNOSIS — M79609 Pain in unspecified limb: Secondary | ICD-10-CM | POA: Diagnosis not present

## 2016-08-15 DIAGNOSIS — Z86718 Personal history of other venous thrombosis and embolism: Secondary | ICD-10-CM

## 2016-08-15 DIAGNOSIS — K219 Gastro-esophageal reflux disease without esophagitis: Secondary | ICD-10-CM | POA: Diagnosis not present

## 2016-08-15 DIAGNOSIS — Z9049 Acquired absence of other specified parts of digestive tract: Secondary | ICD-10-CM

## 2016-08-15 DIAGNOSIS — Z885 Allergy status to narcotic agent status: Secondary | ICD-10-CM | POA: Diagnosis not present

## 2016-08-15 DIAGNOSIS — M545 Low back pain: Secondary | ICD-10-CM | POA: Diagnosis not present

## 2016-08-15 DIAGNOSIS — Z809 Family history of malignant neoplasm, unspecified: Secondary | ICD-10-CM

## 2016-08-15 DIAGNOSIS — Z882 Allergy status to sulfonamides status: Secondary | ICD-10-CM | POA: Diagnosis not present

## 2016-08-15 DIAGNOSIS — Z969 Presence of functional implant, unspecified: Secondary | ICD-10-CM

## 2016-08-15 DIAGNOSIS — Z87891 Personal history of nicotine dependence: Secondary | ICD-10-CM

## 2016-08-15 DIAGNOSIS — E039 Hypothyroidism, unspecified: Secondary | ICD-10-CM | POA: Diagnosis not present

## 2016-08-15 DIAGNOSIS — Z8673 Personal history of transient ischemic attack (TIA), and cerebral infarction without residual deficits: Secondary | ICD-10-CM | POA: Diagnosis not present

## 2016-08-15 DIAGNOSIS — Z472 Encounter for removal of internal fixation device: Secondary | ICD-10-CM | POA: Diagnosis not present

## 2016-08-15 DIAGNOSIS — Z90722 Acquired absence of ovaries, bilateral: Secondary | ICD-10-CM

## 2016-08-15 DIAGNOSIS — Z96651 Presence of right artificial knee joint: Secondary | ICD-10-CM

## 2016-08-15 DIAGNOSIS — G4730 Sleep apnea, unspecified: Secondary | ICD-10-CM | POA: Diagnosis not present

## 2016-08-15 HISTORY — PX: TOTAL KNEE ARTHROPLASTY: SHX125

## 2016-08-15 SURGERY — ARTHROPLASTY, KNEE, TOTAL
Anesthesia: General | Laterality: Right

## 2016-08-15 MED ORDER — GLYCOPYRROLATE 0.2 MG/ML IJ SOLN
0.2000 mg | Freq: Once | INTRAMUSCULAR | Status: AC
  Administered 2016-08-15: 0.2 mg via INTRAVENOUS

## 2016-08-15 MED ORDER — LIDOCAINE 2% (20 MG/ML) 5 ML SYRINGE
INTRAMUSCULAR | Status: DC | PRN
  Administered 2016-08-15: 60 mg via INTRAVENOUS

## 2016-08-15 MED ORDER — PROPOFOL 10 MG/ML IV BOLUS
INTRAVENOUS | Status: AC
  Filled 2016-08-15: qty 20

## 2016-08-15 MED ORDER — ONDANSETRON HCL 4 MG PO TABS: 4.0000 mg | ORAL_TABLET | Freq: Four times a day (QID) | ORAL | Status: DC | PRN

## 2016-08-15 MED ORDER — ONDANSETRON HCL 4 MG/2ML IJ SOLN
INTRAMUSCULAR | Status: DC | PRN
  Administered 2016-08-15: 4 mg via INTRAVENOUS

## 2016-08-15 MED ORDER — OXYCODONE HCL 5 MG PO TABS
ORAL_TABLET | ORAL | Status: AC
  Filled 2016-08-15: qty 1

## 2016-08-15 MED ORDER — HYDROMORPHONE HCL 1 MG/ML IJ SOLN
0.5000 mg | Freq: Once | INTRAMUSCULAR | Status: AC
  Administered 2016-08-15: 0.5 mg via INTRAVENOUS

## 2016-08-15 MED ORDER — SUGAMMADEX SODIUM 200 MG/2ML IV SOLN
INTRAVENOUS | Status: DC | PRN
  Administered 2016-08-15: 190 mg via INTRAVENOUS

## 2016-08-15 MED ORDER — PHENOL 1.4 % MT LIQD: 1.0000 | OROMUCOSAL | Status: DC | PRN

## 2016-08-15 MED ORDER — KETOROLAC TROMETHAMINE 30 MG/ML IJ SOLN
30.0000 mg | Freq: Once | INTRAMUSCULAR | Status: AC
  Administered 2016-08-15: 30 mg via INTRAVENOUS

## 2016-08-15 MED ORDER — KETOROLAC TROMETHAMINE 30 MG/ML IJ SOLN
INTRAMUSCULAR | Status: AC
  Filled 2016-08-15: qty 1

## 2016-08-15 MED ORDER — DEXAMETHASONE SODIUM PHOSPHATE 10 MG/ML IJ SOLN
INTRAMUSCULAR | Status: DC | PRN
  Administered 2016-08-15: 5 mg via INTRAVENOUS

## 2016-08-15 MED ORDER — FENTANYL CITRATE (PF) 100 MCG/2ML IJ SOLN
INTRAMUSCULAR | Status: AC
  Filled 2016-08-15: qty 2

## 2016-08-15 MED ORDER — ACETAMINOPHEN 325 MG PO TABS
650.0000 mg | ORAL_TABLET | Freq: Four times a day (QID) | ORAL | Status: DC | PRN
  Administered 2016-08-15 – 2016-08-17 (×5): 650 mg via ORAL
  Filled 2016-08-15 (×6): qty 2

## 2016-08-15 MED ORDER — MENTHOL 3 MG MT LOZG: 1.0000 | LOZENGE | OROMUCOSAL | Status: DC | PRN

## 2016-08-15 MED ORDER — SODIUM CHLORIDE 0.9 % IV SOLN
INTRAVENOUS | Status: DC
  Administered 2016-08-15: via INTRAVENOUS

## 2016-08-15 MED ORDER — MIRABEGRON ER 50 MG PO TB24
50.0000 mg | ORAL_TABLET | Freq: Every day | ORAL | Status: DC
  Administered 2016-08-15 – 2016-08-17 (×3): 50 mg via ORAL
  Filled 2016-08-15 (×3): qty 1

## 2016-08-15 MED ORDER — ROCURONIUM BROMIDE 10 MG/ML (PF) SYRINGE
PREFILLED_SYRINGE | INTRAVENOUS | Status: AC
  Filled 2016-08-15: qty 5

## 2016-08-15 MED ORDER — METHOCARBAMOL 1000 MG/10ML IJ SOLN
500.0000 mg | Freq: Four times a day (QID) | INTRAVENOUS | Status: DC | PRN
  Filled 2016-08-15: qty 5

## 2016-08-15 MED ORDER — DULOXETINE HCL 60 MG PO CPEP
60.0000 mg | ORAL_CAPSULE | Freq: Two times a day (BID) | ORAL | Status: DC
  Administered 2016-08-15 – 2016-08-17 (×4): 60 mg via ORAL
  Filled 2016-08-15 (×4): qty 1

## 2016-08-15 MED ORDER — MIDAZOLAM HCL 2 MG/2ML IJ SOLN
INTRAMUSCULAR | Status: AC
  Administered 2016-08-15: 2 mg via INTRAVENOUS
  Filled 2016-08-15: qty 2

## 2016-08-15 MED ORDER — ONDANSETRON HCL 4 MG/2ML IJ SOLN: 4.0000 mg | Freq: Four times a day (QID) | INTRAMUSCULAR | Status: DC | PRN

## 2016-08-15 MED ORDER — LEVOTHYROXINE SODIUM 125 MCG PO TABS
125.0000 ug | ORAL_TABLET | Freq: Every day | ORAL | Status: DC
  Administered 2016-08-16 – 2016-08-17 (×2): 125 ug via ORAL
  Filled 2016-08-15 (×2): qty 1

## 2016-08-15 MED ORDER — DOCUSATE SODIUM 100 MG PO CAPS
100.0000 mg | ORAL_CAPSULE | Freq: Two times a day (BID) | ORAL | Status: DC
  Administered 2016-08-15 – 2016-08-17 (×4): 100 mg via ORAL
  Filled 2016-08-15 (×4): qty 1

## 2016-08-15 MED ORDER — BISACODYL 10 MG RE SUPP: 10.0000 mg | Freq: Every day | RECTAL | Status: DC | PRN

## 2016-08-15 MED ORDER — OXYCODONE HCL 5 MG PO TABS
5.0000 mg | ORAL_TABLET | ORAL | Status: DC | PRN
  Administered 2016-08-15 (×2): 10 mg via ORAL
  Administered 2016-08-15: 5 mg via ORAL
  Administered 2016-08-16 – 2016-08-17 (×8): 10 mg via ORAL
  Filled 2016-08-15 (×10): qty 2

## 2016-08-15 MED ORDER — METHOCARBAMOL 500 MG PO TABS
500.0000 mg | ORAL_TABLET | Freq: Four times a day (QID) | ORAL | Status: DC | PRN
  Administered 2016-08-15 – 2016-08-17 (×5): 500 mg via ORAL
  Filled 2016-08-15 (×5): qty 1

## 2016-08-15 MED ORDER — METOCLOPRAMIDE HCL 5 MG/ML IJ SOLN: 5.0000 mg | Freq: Three times a day (TID) | INTRAMUSCULAR | Status: DC | PRN

## 2016-08-15 MED ORDER — FENTANYL CITRATE (PF) 100 MCG/2ML IJ SOLN
25.0000 ug | INTRAMUSCULAR | Status: DC | PRN
  Administered 2016-08-15 (×3): 25 ug via INTRAVENOUS
  Administered 2016-08-15: 50 ug via INTRAVENOUS

## 2016-08-15 MED ORDER — CHLORHEXIDINE GLUCONATE 4 % EX LIQD: 60.0000 mL | Freq: Once | CUTANEOUS | Status: DC

## 2016-08-15 MED ORDER — MIDAZOLAM HCL 2 MG/2ML IJ SOLN
2.0000 mg | Freq: Once | INTRAMUSCULAR | Status: AC
  Administered 2016-08-15: 2 mg via INTRAVENOUS

## 2016-08-15 MED ORDER — GLYCOPYRROLATE 0.2 MG/ML IJ SOLN
INTRAMUSCULAR | Status: AC
  Filled 2016-08-15: qty 1

## 2016-08-15 MED ORDER — TRAZODONE HCL 100 MG PO TABS
100.0000 mg | ORAL_TABLET | Freq: Every evening | ORAL | Status: DC | PRN
  Administered 2016-08-16: 200 mg via ORAL
  Filled 2016-08-15: qty 3

## 2016-08-15 MED ORDER — FENTANYL CITRATE (PF) 100 MCG/2ML IJ SOLN
INTRAMUSCULAR | Status: AC
  Administered 2016-08-15: 100 ug via INTRAVENOUS
  Filled 2016-08-15: qty 2

## 2016-08-15 MED ORDER — SODIUM CHLORIDE 0.9 % IV SOLN
INTRAVENOUS | Status: DC
  Administered 2016-08-15: 15:00:00 via INTRAVENOUS

## 2016-08-15 MED ORDER — TRANEXAMIC ACID 1000 MG/10ML IV SOLN
2000.0000 mg | Freq: Once | INTRAVENOUS | Status: DC
  Filled 2016-08-15 (×2): qty 20

## 2016-08-15 MED ORDER — PANTOPRAZOLE SODIUM 40 MG PO TBEC
40.0000 mg | DELAYED_RELEASE_TABLET | Freq: Every day | ORAL | Status: DC
  Administered 2016-08-16 – 2016-08-17 (×2): 40 mg via ORAL
  Filled 2016-08-15 (×2): qty 1

## 2016-08-15 MED ORDER — ESTRADIOL 0.1 MG/GM VA CREA
1.0000 | TOPICAL_CREAM | Freq: Every day | VAGINAL | Status: DC
  Administered 2016-08-15: 1 via VAGINAL
  Filled 2016-08-15: qty 42.5

## 2016-08-15 MED ORDER — HYDROMORPHONE HCL 1 MG/ML IJ SOLN
INTRAMUSCULAR | Status: AC
  Filled 2016-08-15: qty 0.5

## 2016-08-15 MED ORDER — FENTANYL CITRATE (PF) 100 MCG/2ML IJ SOLN
100.0000 ug | Freq: Once | INTRAMUSCULAR | Status: AC
  Administered 2016-08-15: 100 ug via INTRAVENOUS

## 2016-08-15 MED ORDER — METOCLOPRAMIDE HCL 5 MG PO TABS: 5.0000 mg | ORAL_TABLET | Freq: Three times a day (TID) | ORAL | Status: DC | PRN

## 2016-08-15 MED ORDER — TRANEXAMIC ACID 1000 MG/10ML IV SOLN
INTRAVENOUS | Status: AC | PRN
  Administered 2016-08-15: 2000 mg via TOPICAL

## 2016-08-15 MED ORDER — RIVAROXABAN 20 MG PO TABS
20.0000 mg | ORAL_TABLET | Freq: Every day | ORAL | Status: DC
  Administered 2016-08-15: 20 mg via ORAL
  Filled 2016-08-15: qty 1

## 2016-08-15 MED ORDER — SODIUM CHLORIDE 0.9 % IR SOLN
Status: DC | PRN
  Administered 2016-08-15: 3000 mL

## 2016-08-15 MED ORDER — ONDANSETRON HCL 4 MG/2ML IJ SOLN
INTRAMUSCULAR | Status: AC
Start: 2016-08-15 — End: 2016-08-15
  Filled 2016-08-15: qty 2

## 2016-08-15 MED ORDER — METHYLPHENIDATE HCL 5 MG PO TABS: 20.0000 mg | ORAL_TABLET | ORAL | Status: DC

## 2016-08-15 MED ORDER — CEFAZOLIN SODIUM-DEXTROSE 2-4 GM/100ML-% IV SOLN
INTRAVENOUS | Status: AC
  Filled 2016-08-15: qty 100

## 2016-08-15 MED ORDER — SUGAMMADEX SODIUM 200 MG/2ML IV SOLN
INTRAVENOUS | Status: AC
  Filled 2016-08-15: qty 2

## 2016-08-15 MED ORDER — FENTANYL CITRATE (PF) 250 MCG/5ML IJ SOLN
INTRAMUSCULAR | Status: AC
  Filled 2016-08-15: qty 5

## 2016-08-15 MED ORDER — CEFAZOLIN SODIUM-DEXTROSE 2-4 GM/100ML-% IV SOLN
2.0000 g | INTRAVENOUS | Status: AC
  Administered 2016-08-15: 2 g via INTRAVENOUS

## 2016-08-15 MED ORDER — 0.9 % SODIUM CHLORIDE (POUR BTL) OPTIME
TOPICAL | Status: DC | PRN
  Administered 2016-08-15: 1000 mL

## 2016-08-15 MED ORDER — HYDROMORPHONE HCL 1 MG/ML IJ SOLN
1.0000 mg | INTRAMUSCULAR | Status: DC | PRN
  Administered 2016-08-15 – 2016-08-17 (×7): 1 mg via INTRAVENOUS
  Filled 2016-08-15 (×7): qty 1

## 2016-08-15 MED ORDER — FENTANYL CITRATE (PF) 250 MCG/5ML IJ SOLN
INTRAMUSCULAR | Status: DC | PRN
  Administered 2016-08-15 (×5): 50 ug via INTRAVENOUS

## 2016-08-15 MED ORDER — CEFAZOLIN SODIUM-DEXTROSE 2-4 GM/100ML-% IV SOLN
2.0000 g | Freq: Four times a day (QID) | INTRAVENOUS | Status: AC
  Administered 2016-08-15 (×2): 2 g via INTRAVENOUS
  Filled 2016-08-15 (×2): qty 100

## 2016-08-15 MED ORDER — OXYBUTYNIN CHLORIDE ER 10 MG PO TB24
10.0000 mg | ORAL_TABLET | Freq: Every day | ORAL | Status: DC
  Administered 2016-08-15 – 2016-08-17 (×3): 10 mg via ORAL
  Filled 2016-08-15 (×3): qty 1

## 2016-08-15 MED ORDER — GABAPENTIN 600 MG PO TABS
600.0000 mg | ORAL_TABLET | Freq: Three times a day (TID) | ORAL | Status: DC
  Administered 2016-08-15 – 2016-08-17 (×6): 600 mg via ORAL
  Filled 2016-08-15 (×6): qty 1

## 2016-08-15 MED ORDER — ACETAMINOPHEN 650 MG RE SUPP: 650.0000 mg | Freq: Four times a day (QID) | RECTAL | Status: DC | PRN

## 2016-08-15 MED ORDER — LACTATED RINGERS IV SOLN
INTRAVENOUS | Status: DC
  Administered 2016-08-15 (×2): via INTRAVENOUS

## 2016-08-15 MED ORDER — MAGNESIUM CITRATE PO SOLN: 1.0000 | Freq: Once | ORAL | Status: DC | PRN

## 2016-08-15 MED ORDER — MISOPROSTOL 100 MCG PO TABS
100.0000 ug | ORAL_TABLET | Freq: Two times a day (BID) | ORAL | Status: DC
  Administered 2016-08-16 – 2016-08-17 (×3): 100 ug via ORAL
  Filled 2016-08-15 (×5): qty 1

## 2016-08-15 MED ORDER — POLYETHYLENE GLYCOL 3350 17 G PO PACK: 17.0000 g | PACK | Freq: Every day | ORAL | Status: DC | PRN

## 2016-08-15 MED ORDER — TOPIRAMATE 25 MG PO TABS
25.0000 mg | ORAL_TABLET | Freq: Two times a day (BID) | ORAL | Status: DC
  Administered 2016-08-15 – 2016-08-17 (×4): 25 mg via ORAL
  Filled 2016-08-15 (×4): qty 1

## 2016-08-15 MED ORDER — ROCURONIUM BROMIDE 10 MG/ML (PF) SYRINGE
PREFILLED_SYRINGE | INTRAVENOUS | Status: DC | PRN
  Administered 2016-08-15: 10 mg via INTRAVENOUS
  Administered 2016-08-15: 20 mg via INTRAVENOUS
  Administered 2016-08-15: 40 mg via INTRAVENOUS

## 2016-08-15 MED ORDER — PROPOFOL 10 MG/ML IV BOLUS
INTRAVENOUS | Status: DC | PRN
  Administered 2016-08-15: 150 mg via INTRAVENOUS
  Administered 2016-08-15: 50 mg via INTRAVENOUS

## 2016-08-15 MED ORDER — DEXAMETHASONE SODIUM PHOSPHATE 10 MG/ML IJ SOLN
INTRAMUSCULAR | Status: AC
  Filled 2016-08-15: qty 1

## 2016-08-15 SURGICAL SUPPLY — 50 items
BLADE SAGITTAL 25.0X1.19X90 (BLADE) ×2 IMPLANT
BLADE SAW SGTL 13X75X1.27 (BLADE) ×2 IMPLANT
BLADE SURG 21 STRL SS (BLADE) ×4 IMPLANT
BNDG COHESIVE 6X5 TAN STRL LF (GAUZE/BANDAGES/DRESSINGS) ×4 IMPLANT
BNDG GAUZE ELAST 4 BULKY (GAUZE/BANDAGES/DRESSINGS) ×2 IMPLANT
BONE CEMENT PALACOSE (Cement) ×4 IMPLANT
BOWL SMART MIX CTS (DISPOSABLE) ×2 IMPLANT
CAP KNEE TOTAL 3 SIGMA ×2 IMPLANT
CEMENT BONE PALACOSE (Cement) ×2 IMPLANT
COVER SURGICAL LIGHT HANDLE (MISCELLANEOUS) ×2 IMPLANT
CUFF TOURNIQUET SINGLE 34IN LL (TOURNIQUET CUFF) ×2 IMPLANT
CUFF TOURNIQUET SINGLE 44IN (TOURNIQUET CUFF) IMPLANT
DRAPE EXTREMITY T 121X128X90 (DRAPE) ×2 IMPLANT
DRAPE HALF SHEET 40X57 (DRAPES) IMPLANT
DRAPE U-SHAPE 47X51 STRL (DRAPES) IMPLANT
DRSG ADAPTIC 3X8 NADH LF (GAUZE/BANDAGES/DRESSINGS) ×2 IMPLANT
DRSG PAD ABDOMINAL 8X10 ST (GAUZE/BANDAGES/DRESSINGS) ×2 IMPLANT
DURAPREP 26ML APPLICATOR (WOUND CARE) ×2 IMPLANT
ELECT REM PT RETURN 9FT ADLT (ELECTROSURGICAL) ×2
ELECTRODE REM PT RTRN 9FT ADLT (ELECTROSURGICAL) ×1 IMPLANT
FACESHIELD WRAPAROUND (MASK) ×2 IMPLANT
GAUZE SPONGE 4X4 12PLY STRL (GAUZE/BANDAGES/DRESSINGS) ×2 IMPLANT
GLOVE BIOGEL PI IND STRL 9 (GLOVE) ×1 IMPLANT
GLOVE BIOGEL PI INDICATOR 9 (GLOVE) ×1
GLOVE SURG ORTHO 9.0 STRL STRW (GLOVE) ×2 IMPLANT
GOWN STRL REUS W/ TWL XL LVL3 (GOWN DISPOSABLE) ×2 IMPLANT
GOWN STRL REUS W/TWL XL LVL3 (GOWN DISPOSABLE) ×2
HANDPIECE INTERPULSE COAX TIP (DISPOSABLE) ×1
KIT BASIN OR (CUSTOM PROCEDURE TRAY) ×2 IMPLANT
KIT ROOM TURNOVER OR (KITS) ×2 IMPLANT
MANIFOLD NEPTUNE II (INSTRUMENTS) ×2 IMPLANT
NEEDLE SPNL 18GX3.5 QUINCKE PK (NEEDLE) ×2 IMPLANT
NS IRRIG 1000ML POUR BTL (IV SOLUTION) ×2 IMPLANT
PACK TOTAL JOINT (CUSTOM PROCEDURE TRAY) ×2 IMPLANT
PACK UNIVERSAL I (CUSTOM PROCEDURE TRAY) IMPLANT
PAD ARMBOARD 7.5X6 YLW CONV (MISCELLANEOUS) ×2 IMPLANT
SET HNDPC FAN SPRY TIP SCT (DISPOSABLE) ×1 IMPLANT
STAPLER VISISTAT 35W (STAPLE) ×2 IMPLANT
SUCTION FRAZIER HANDLE 10FR (MISCELLANEOUS)
SUCTION TUBE FRAZIER 10FR DISP (MISCELLANEOUS) IMPLANT
SUT VIC AB 0 CT1 27 (SUTURE) ×1
SUT VIC AB 0 CT1 27XBRD ANBCTR (SUTURE) ×1 IMPLANT
SUT VIC AB 1 CTX 36 (SUTURE)
SUT VIC AB 1 CTX36XBRD ANBCTR (SUTURE) IMPLANT
SYR 50ML LL SCALE MARK (SYRINGE) IMPLANT
TOWEL OR 17X24 6PK STRL BLUE (TOWEL DISPOSABLE) IMPLANT
TOWEL OR 17X26 10 PK STRL BLUE (TOWEL DISPOSABLE) ×2 IMPLANT
TRAY CATH 16FR W/PLASTIC CATH (SET/KITS/TRAYS/PACK) IMPLANT
TRAY FOLEY W/METER SILVER 16FR (SET/KITS/TRAYS/PACK) IMPLANT
WRAP KNEE MAXI GEL POST OP (GAUZE/BANDAGES/DRESSINGS) ×2 IMPLANT

## 2016-08-15 NOTE — Anesthesia Preprocedure Evaluation (Signed)
Anesthesia Evaluation  Patient identified by MRN, date of birth, ID band Patient awake    Reviewed: Allergy & Precautions, NPO status , Patient's Chart, lab work & pertinent test results  Airway Mallampati: II  TM Distance: >3 FB     Dental   Pulmonary sleep apnea , former smoker,    breath sounds clear to auscultation       Cardiovascular negative cardio ROS   Rhythm:Regular Rate:Normal     Neuro/Psych    GI/Hepatic   Endo/Other  Hypothyroidism   Renal/GU      Musculoskeletal  (+) Arthritis , Fibromyalgia -  Abdominal   Peds  Hematology  (+) anemia ,   Anesthesia Other Findings   Reproductive/Obstetrics                             Anesthesia Physical Anesthesia Plan  ASA: III  Anesthesia Plan: General   Post-op Pain Management:  Regional for Post-op pain   Induction: Intravenous  PONV Risk Score and Plan: 3 and Ondansetron, Dexamethasone, Propofol and Midazolam  Airway Management Planned: Oral ETT  Additional Equipment:   Intra-op Plan:   Post-operative Plan: Extubation in OR  Informed Consent: I have reviewed the patients History and Physical, chart, labs and discussed the procedure including the risks, benefits and alternatives for the proposed anesthesia with the patient or authorized representative who has indicated his/her understanding and acceptance.   Dental advisory given  Plan Discussed with: CRNA and Anesthesiologist  Anesthesia Plan Comments:         Anesthesia Quick Evaluation

## 2016-08-15 NOTE — Evaluation (Signed)
Physical Therapy Evaluation Patient Details Name: Debbie Pineda MRN: [id number] DOB: Oct 06, 1960 Today's Date: 08/15/2016   History of Present Illness  Pt is a 56 y/o female s/p elective R TKA. PMH includes CVA, DVT, fibromyalgia, LBP, sleep apnea, heart palpitations, patent foramen ovale, memory disorder, and s/p L TKA, and R ACL surgery.   Clinical Impression  Pt is s/p surgery above with deficits below. PTA, pt used a cane or RW for ambulation. Upon eval, pt limited by post op pain and weakness, as well as, decreased balance. Required min to min guard assist for mobility this session. Reported her husband would be available to assist as needed upon d/c home and reports she will be getting HHPT. Has all necessary DME at home. Will continue to follow acutely to maximize functional mobility independence.     Follow Up Recommendations DC plan and follow up therapy as arranged by surgeon;Supervision/Assistance - 24 hour    Equipment Recommendations  None recommended by PT (has all DME )    Recommendations for Other Services       Precautions / Restrictions Precautions Precautions: Knee Precaution Booklet Issued: Yes (comment) Precaution Comments: Reviewed supine ther ex with pt.  Restrictions Weight Bearing Restrictions: Yes RLE Weight Bearing: Weight bearing as tolerated      Mobility  Bed Mobility Overal bed mobility: Needs Assistance Bed Mobility: Supine to Sit     Supine to sit: Min assist     General bed mobility comments: Min A for RLE management.   Transfers Overall transfer level: Needs assistance Equipment used: Rolling walker (2 wheeled) Transfers: Sit to/from Stand Sit to Stand: Min guard         General transfer comment: Min guard for safety. Demonstrated safe hand placement.   Ambulation/Gait Ambulation/Gait assistance: Min guard Ambulation Distance (Feet): 50 Feet Assistive device: Rolling walker (2 wheeled) Gait Pattern/deviations: Step-to  pattern;Step-through pattern;Decreased step length - right;Decreased step length - left;Decreased stride length;Decreased weight shift to right;Antalgic Gait velocity: Decreased Gait velocity interpretation: Below normal speed for age/gender General Gait Details: Slow, antalgic gait. Initially avoiding putting weight on RLE, however, encouraged increased WB throughout. Pt able to progress to step through pattern by end of gait. Verbal cues for sequencing with device and for upright posture. Pt required cues for appropriate heel strike as well.   Stairs            Wheelchair Mobility    Modified Rankin (Stroke Patients Only)       Balance Overall balance assessment: Needs assistance Sitting-balance support: No upper extremity supported;Feet supported Sitting balance-Leahy Scale: Good     Standing balance support: Bilateral upper extremity supported;During functional activity Standing balance-Leahy Scale: Poor Standing balance comment: Reliant on BUE support                              Pertinent Vitals/Pain Pain Assessment: 0-10 Pain Score: 4  Pain Location: R knee  Pain Descriptors / Indicators: Sore;Operative site guarding Pain Intervention(s): Limited activity within patient's tolerance;Monitored during session;Repositioned    Home Living Family/patient expects to be discharged to:: Private residence Living Arrangements: Spouse/significant other Available Help at Discharge: Family;Available 24 hours/day Type of Home: House Home Access: Stairs to enter Entrance Stairs-Rails: None Entrance Stairs-Number of Steps: 1 (threshold ) Home Layout: Two level;Able to live on main level with bedroom/bathroom Home Equipment: Walker - 2 wheels;Crutches;Cane - single point;Bedside commode      Prior Function Level  of Independence: Independent with assistive device(s)         Comments: Used the cane or RW.      Hand Dominance   Dominant Hand: Right     Extremity/Trunk Assessment   Upper Extremity Assessment Upper Extremity Assessment: Defer to OT evaluation    Lower Extremity Assessment Lower Extremity Assessment: RLE deficits/detail RLE Deficits / Details: Sensory in tact. Deficits consistent with post op pain and weakness. Able to perform exercises below.     Cervical / Trunk Assessment Cervical / Trunk Assessment: Normal  Communication   Communication: No difficulties  Cognition Arousal/Alertness: Awake/alert Behavior During Therapy: WFL for tasks assessed/performed Overall Cognitive Status: Within Functional Limits for tasks assessed                                        General Comments General comments (skin integrity, edema, etc.): Pt daughter and husband present throughout. Pt very eager to get up, so did not get to cover all ther ex. Will need to review in next session     Exercises Total Joint Exercises Ankle Circles/Pumps: AROM;Both;15 reps;Supine Quad Sets: AROM;Right;10 reps;Seated Towel Squeeze: AROM;Both;10 reps;Seated Heel Slides:  (Instructed pt to perform upon return to bed) Hip ABduction/ADduction: AROM;Right;10 reps;Seated   Assessment/Plan    PT Assessment Patient needs continued PT services  PT Problem List Decreased strength;Decreased range of motion;Decreased activity tolerance;Decreased balance;Decreased mobility;Decreased knowledge of use of DME;Pain       PT Treatment Interventions DME instruction;Gait training;Stair training;Functional mobility training;Therapeutic activities;Therapeutic exercise;Balance training;Neuromuscular re-education;Patient/family education    PT Goals (Current goals can be found in the Care Plan section)  Acute Rehab PT Goals Patient Stated Goal: to go home  PT Goal Formulation: With patient Time For Goal Achievement: 08/22/16 Potential to Achieve Goals: Good    Frequency 7X/week   Barriers to discharge        Co-evaluation                AM-PAC PT "6 Clicks" Daily Activity  Outcome Measure Difficulty turning over in bed (including adjusting bedclothes, sheets and blankets)?: A Little Difficulty moving from lying on back to sitting on the side of the bed? : Total Difficulty sitting down on and standing up from a chair with arms (e.g., wheelchair, bedside commode, etc,.)?: Total Help needed moving to and from a bed to chair (including a wheelchair)?: A Little Help needed walking in hospital room?: A Little Help needed climbing 3-5 steps with a railing? : A Little 6 Click Score: 14    End of Session Equipment Utilized During Treatment: Gait belt Activity Tolerance: Patient tolerated treatment well Patient left: in chair;with call bell/phone within reach;with family/visitor present Nurse Communication: Mobility status PT Visit Diagnosis: Other abnormalities of gait and mobility (R26.89);Pain Pain - Right/Left: Right Pain - part of body: Knee    Time: 3299-2426 PT Time Calculation (min) (ACUTE ONLY): 36 min   Charges:   PT Evaluation $PT Eval Low Complexity: 1 Procedure PT Treatments $Gait Training: 8-22 mins   PT G Codes:        Leighton Ruff, PT, DPT  Acute Rehabilitation Services  Pager: [phone]   Rudean Hitt 08/15/2016, 6:58 PM

## 2016-08-15 NOTE — Progress Notes (Signed)
08/15/2016- Pt set up with CPAP for night time use.  Pt uses CPAP at home.  Pt supplied circuit and mask from home.  Pt instructed on use of machine.  Verbally and demonstrated understanding of machine.  No issues noted at time of set up.

## 2016-08-15 NOTE — Discharge Instructions (Signed)
INSTRUCTIONS AFTER JOINT REPLACEMENT  ° °o Remove items at home which could result in a fall. This includes throw rugs or furniture in walking pathways °o ICE to the affected joint every three hours while awake for 30 minutes at a time, for at least the first 3-5 days, and then as needed for pain and swelling.  Continue to use ice for pain and swelling. You may notice swelling that will progress down to the foot and ankle.  This is normal after surgery.  Elevate your leg when you are not up walking on it.   °o Continue to use the breathing machine you got in the hospital (incentive spirometer) which will help keep your temperature down.  It is common for your temperature to cycle up and down following surgery, especially at night when you are not up moving around and exerting yourself.  The breathing machine keeps your lungs expanded and your temperature down. ° ° °DIET:  As you were doing prior to hospitalization, we recommend a well-balanced diet. ° °DRESSING / WOUND CARE / SHOWERING ° °You may change your dressing 3-5 days after surgery.  Then change the dressing every day with sterile gauze.  Please use good hand washing techniques before changing the dressing.  Do not use any lotions or creams on the incision until instructed by your surgeon. ° °ACTIVITY ° °o Increase activity slowly as tolerated, but follow the weight bearing instructions below.   °o No driving for 6 weeks or until further direction given by your physician.  You cannot drive while taking narcotics.  °o No lifting or carrying greater than 10 lbs. until further directed by your surgeon. °o Avoid periods of inactivity such as sitting longer than an hour when not asleep. This helps prevent blood clots.  °o You may return to work once you are authorized by your doctor.  ° ° ° °WEIGHT BEARING  ° °Weight bearing as tolerated with assist device (walker, cane, etc) as directed, use it as long as suggested by your surgeon or therapist, typically at  least 4-6 weeks. ° ° °EXERCISES ° °Results after joint replacement surgery are often greatly improved when you follow the exercise, range of motion and muscle strengthening exercises prescribed by your doctor. Safety measures are also important to protect the joint from further injury. Any time any of these exercises cause you to have increased pain or swelling, decrease what you are doing until you are comfortable again and then slowly increase them. If you have problems or questions, call your caregiver or physical therapist for advice.  ° °Rehabilitation is important following a joint replacement. After just a few days of immobilization, the muscles of the leg can become weakened and shrink (atrophy).  These exercises are designed to build up the tone and strength of the thigh and leg muscles and to improve motion. Often times heat used for twenty to thirty minutes before working out will loosen up your tissues and help with improving the range of motion but do not use heat for the first two weeks following surgery (sometimes heat can increase post-operative swelling).  ° °These exercises can be done on a training (exercise) mat, on the floor, on a table or on a bed. Use whatever works the best and is most comfortable for you.    Use music or television while you are exercising so that the exercises are a pleasant break in your day. This will make your life better with the exercises acting as a break   in your routine that you can look forward to.   Perform all exercises about fifteen times, three times per day or as directed.  You should exercise both the operative leg and the other leg as well. ° °Exercises include: °  °• Quad Sets - Tighten up the muscle on the front of the thigh (Quad) and hold for 5-10 seconds.   °• Straight Leg Raises - With your knee straight (if you were given a brace, keep it on), lift the leg to 60 degrees, hold for 3 seconds, and slowly lower the leg.  Perform this exercise against  resistance later as your leg gets stronger.  °• Leg Slides: Lying on your back, slowly slide your foot toward your buttocks, bending your knee up off the floor (only go as far as is comfortable). Then slowly slide your foot back down until your leg is flat on the floor again.  °• Angel Wings: Lying on your back spread your legs to the side as far apart as you can without causing discomfort.  °• Hamstring Strength:  Lying on your back, push your heel against the floor with your leg straight by tightening up the muscles of your buttocks.  Repeat, but this time bend your knee to a comfortable angle, and push your heel against the floor.  You may put a pillow under the heel to make it more comfortable if necessary.  ° °A rehabilitation program following joint replacement surgery can speed recovery and prevent re-injury in the future due to weakened muscles. Contact your doctor or a physical therapist for more information on knee rehabilitation.  ° ° °CONSTIPATION ° °Constipation is defined medically as fewer than three stools per week and severe constipation as less than one stool per week.  Even if you have a regular bowel pattern at home, your normal regimen is likely to be disrupted due to multiple reasons following surgery.  Combination of anesthesia, postoperative narcotics, change in appetite and fluid intake all can affect your bowels.  ° °YOU MUST use at least one of the following options; they are listed in order of increasing strength to get the job done.  They are all available over the counter, and you may need to use some, POSSIBLY even all of these options:   ° °Drink plenty of fluids (prune juice may be helpful) and high fiber foods °Colace 100 mg by mouth twice a day  °Senokot for constipation as directed and as needed Dulcolax (bisacodyl), take with full glass of water  °Miralax (polyethylene glycol) once or twice a day as needed. ° °If you have tried all these things and are unable to have a bowel  movement in the first 3-4 days after surgery call either your surgeon or your primary doctor.   ° °If you experience loose stools or diarrhea, hold the medications until you stool forms back up.  If your symptoms do not get better within 1 week or if they get worse, check with your doctor.  If you experience "the worst abdominal pain ever" or develop nausea or vomiting, please contact the office immediately for further recommendations for treatment. ° ° °ITCHING:  If you experience itching with your medications, try taking only a single pain pill, or even half a pain pill at a time.  You can also use Benadryl over the counter for itching or also to help with sleep.  ° °TED HOSE STOCKINGS:  Use stockings on both legs until for at least 2 weeks or as   directed by physician office. They may be removed at night for sleeping. ° °MEDICATIONS:  See your medication summary on the “After Visit Summary” that nursing will review with you.  You may have some home medications which will be placed on hold until you complete the course of blood thinner medication.  It is important for you to complete the blood thinner medication as prescribed. ° °PRECAUTIONS:  If you experience chest pain or shortness of breath - call 911 immediately for transfer to the hospital emergency department.  ° °If you develop a fever greater that 101 F, purulent drainage from wound, increased redness or drainage from wound, foul odor from the wound/dressing, or calf pain - CONTACT YOUR SURGEON.   °                                                °FOLLOW-UP APPOINTMENTS:  If you do not already have a post-op appointment, please call the office for an appointment to be seen by your surgeon.  Guidelines for how soon to be seen are listed in your “After Visit Summary”, but are typically between 1-4 weeks after surgery. ° °OTHER INSTRUCTIONS:  ° °Knee Replacement:  Do not place pillow under knee, focus on keeping the knee straight while resting. CPM  instructions: 0-90 degrees, 2 hours in the morning, 2 hours in the afternoon, and 2 hours in the evening. Place foam block, curve side up under heel at all times except when in CPM or when walking.  DO NOT modify, tear, cut, or change the foam block in any way. ° °MAKE SURE YOU:  °• Understand these instructions.  °• Get help right away if you are not doing well or get worse.  ° ° °Thank you for letting us be a part of your medical care team.  It is a privilege we respect greatly.  We hope these instructions will help you stay on track for a fast and full recovery!  ° °Information on my medicine - XARELTO® (Rivaroxaban) ° ° °Why was Xarelto® prescribed for you? °Xarelto® was prescribed for you to reduce the risk of blood clots forming after orthopedic surgery. The medical term for these abnormal blood clots is venous thromboembolism (VTE). ° °What do you need to know about xarelto® ? °Take your Xarelto® ONCE DAILY at the same time every day. °You may take it either with or without food. ° °If you have difficulty swallowing the tablet whole, you may crush it and mix in applesauce just prior to taking your dose. ° °Take Xarelto® exactly as prescribed by your doctor and DO NOT stop taking Xarelto® without talking to the doctor who prescribed the medication.  Stopping without other VTE prevention medication to take the place of Xarelto® may increase your risk of developing a clot. ° °After discharge, you should have regular check-up appointments with your healthcare provider that is prescribing your Xarelto®.   ° °What do you do if you miss a dose? °If you miss a dose, take it as soon as you remember on the same day then continue your regularly scheduled once daily regimen the next day. Do not take two doses of Xarelto® on the same day.  ° °Important Safety Information °A possible side effect of Xarelto® is bleeding. You should call your healthcare provider right away if you experience any of the following: °?   Bleeding  from an injury or your nose that does not stop. °? Unusual colored urine (red or dark brown) or unusual colored stools (red or black). °? Unusual bruising for unknown reasons. °? A serious fall or if you hit your head (even if there is no bleeding). ° °Some medicines may interact with Xarelto® and might increase your risk of bleeding while on Xarelto®. To help avoid this, consult your healthcare provider or pharmacist prior to using any new prescription or non-prescription medications, including herbals, vitamins, non-steroidal anti-inflammatory drugs (NSAIDs) and supplements. ° °This website has more information on Xarelto®: www.xarelto.com. ° ° °

## 2016-08-15 NOTE — Anesthesia Procedure Notes (Signed)
Procedure Name: Intubation Date/Time: 08/15/2016 9:20 AM Performed by: Freddie Breech Pre-anesthesia Checklist: Patient identified, Emergency Drugs available, Suction available and Patient being monitored Patient Re-evaluated:Patient Re-evaluated prior to induction Oxygen Delivery Method: Circle System Utilized Preoxygenation: Pre-oxygenation with 100% oxygen Induction Type: IV induction Ventilation: Mask ventilation without difficulty Laryngoscope Size: Mac and 3 Grade View: Grade I Tube type: Oral Tube size: 7.0 mm Number of attempts: 1 Airway Equipment and Method: Stylet and Oral airway Placement Confirmation: ETT inserted through vocal cords under direct vision,  positive ETCO2 and breath sounds checked- equal and bilateral Secured at: 21 cm Tube secured with: Tape Dental Injury: Teeth and Oropharynx as per pre-operative assessment

## 2016-08-15 NOTE — H&P (Signed)
TOTAL KNEE ADMISSION H&P  Patient is being admitted for right total knee arthroplasty.  Subjective:  Chief Complaint:right knee pain.  HPI: Debbie Pineda, 56 y.o. female, has a history of pain and functional disability in the right knee due to arthritis and has failed non-surgical conservative treatments for greater than 12 weeks to includeNSAID's and/or analgesics, corticosteriod injections and activity modification.  Onset of symptoms was gradual, starting 8 years ago with gradually worsening course since that time. The patient noted prior procedures on the knee to include  ACL reconstruction on the right knee(s).  Patient currently rates pain in the right knee(s) at 8 out of 10 with activity. Patient has night pain, worsening of pain with activity and weight bearing, pain that interferes with activities of daily living, pain with passive range of motion, crepitus and joint swelling.  Patient has evidence of subchondral cysts, subchondral sclerosis, periarticular osteophytes, joint subluxation and joint space narrowing by imaging studies. This patient has had avascular necrosis of the knee. There is no active infection.  Patient Active Problem List   Diagnosis Date Noted  . Memory disorder 08/14/2016  . Cerebrovascular disease 08/14/2016  . Unilateral primary osteoarthritis, right knee 05/29/2016  . Primary osteoarthritis of both feet 05/29/2016  . Trochanteric bursitis of both hips 05/25/2016  . Neck pain 05/25/2016  . Urinary urgency 04/19/2016  . Chronic low back pain 01/11/2016  . Depression 11/29/2015  . 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 Heart palpitations 12/14/2013  . Generalized osteoarthritis of multiple sites 11/04/2013  . Cerebral infarction ([place]) 10/30/2011  . PFO (patent foramen ovale) 10/30/2011  . Bradycardia 10/30/2011  . Chest pain 10/30/2011  . Hypothyroid   . Thrombophlebitis   . Sleep apnea   . Fibromyalgia   . History of gastric bypass, 11/20/2010  12/07/2010  . Status post bariatric surgery 12/07/2010  . Vein disorder 11/29/2010  . S/P total hysterectomy and bilateral salpingo-oophorectomy 11/29/2010  . S/P exploratory laparotomy 11/29/2010  . S/P cholecystectomy 11/29/2010  . S/P ACL surgery 11/29/2010  . Morbid obesity ([place]) 11/10/2010   Past Medical History:  Diagnosis Date  . ADD (attention deficit disorder)    takes Adderall daily  . Arthritis    "all over"  . Cerebral infarction ([place]) 10/30/2011  . Cerebrovascular disease 08/14/2016  . Chronic back pain    "all over"  . Chronic low back pain 01/11/2016  . Complication of anesthesia    tends to have hypotension when NPO and post-anesthesia  . Constipation    takes stool softener daily  . Degenerative disk disease   . Degenerative joint disease   . Depression    takes Cymbalta daily for pain per pt  . DVT (deep venous thrombosis) ([facility])    RLE  . Family history of adverse reaction to anesthesia    a family member woke up during surgery; "think it was my mom"  . Fibromyalgia   . Generalized osteoarthritis of multiple sites 11/04/2013  . GERD (gastroesophageal reflux disease)   . Heart palpitations 12/14/2013  . History of blood clots    superficial  . Hyperglycemia   . Hypothyroid    takes Synthroid daily  . Incomplete emptying of bladder   . Insomnia    takes Trazodone nightly  . Iron deficiency anemia    takes Ferrous Sulfate daily  . Joint pain   . Joint swelling    knees and ankles  . Memory disorder 08/14/2016  . Morbid obesity ([place])   .  Nausea    takes Zofran as needed.Seeing GI doc  . Neck pain 05/25/2016  . OSA on CPAP    tested more than 5 yrs ago.    . Osteoarthritis   . PFO (patent foramen ovale)   . Primary osteoarthritis of both feet 05/29/2016   Right bunionectomy August 2017 by Dr. Sharol Given  . S/P ACL surgery 11/29/2010  . S/P cholecystectomy 11/29/2010  . S/P exploratory laparotomy 11/29/2010  . S/P total hysterectomy and bilateral  salpingo-oophorectomy 11/29/2010  . Scoliosis   . Sleep apnea   . Status post bariatric surgery 12/07/2010  . Stroke [facility]) "several"   right foot weakness; memory issues, black spot right visual field since" (03/23/2015)  . Thrombophlebitis   . 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 Trochanteric bursitis of both hips 05/25/2016  . Unilateral primary osteoarthritis, right knee 05/29/2016  . Urinary urgency 04/19/2016  . Vein disorder 11/29/2010    Past Surgical History:  Procedure Laterality Date  . BUNIONECTOMY Right 08/2015  . CARDIAC CATHETERIZATION     2008.  "it was fine" (not sure why she had it done, and doesn't know where)  . COLONOSCOPY N/A 03/25/2013   Procedure: COLONOSCOPY;  Surgeon: Rogene Houston, MD;  Location: [facility];  Service: Endoscopy;  Laterality: N/A;  930  . ESOPHAGOGASTRODUODENOSCOPY    . EXPLORATORY LAPAROTOMY     "took fallopian tubes out"  . JOINT REPLACEMENT    . KNEE ARTHROSCOPY Left   . KNEE ARTHROSCOPY W/ ACL RECONSTRUCTION Right    "added pins"  . LAPAROSCOPIC CHOLECYSTECTOMY  ~ 2001  . 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 TOTAL KNEE ARTHROPLASTY Left 03/23/2015  . TOTAL KNEE ARTHROPLASTY Left 03/23/2015   Procedure: TOTAL KNEE ARTHROPLASTY;  Surgeon: Newt Minion, MD;  Location: [employer] [facility];  Service: Orthopedics;  Laterality: Left;  Marland Kitchen VAGINAL HYSTERECTOMY    . VARICOSE VEIN SURGERY Right X 2    No prescriptions prior to admission.   Allergies  Allergen Reactions  . Lyrica [Pregabalin] Other (See Comments)    SOB, lower extremity edema and weight gain  . Flexeril [Cyclobenzaprine Hcl] Other (See Comments)    Numbness of extremities  . Morphine And Related Itching    Upper torso  . Sulfamethoxazole-Trimethoprim Itching and Rash    Bactrim  . Belsomra [Suvorexant] Other (See Comments)    unknown  . Tape Itching and Rash    Please use "paper" tape Rash if left on longer than 24 hrs    Social History  Substance Use Topics  . Smoking status:  Former Smoker    Packs/day: 0.75    Years: 8.00    Types: Cigarettes    Quit date: 12/01/1990  . Smokeless tobacco: Never Used     Comment: quit smoking in the 1990s  . Alcohol use No     Comment: 03/23/2015 "stopped drinking in 2012 w/gastric bypass; drank socially before bypass"    Family History  Problem Relation Age of Onset  . Heart disease Father   . Cancer Father   . Cancer Mother   . Heart disease Brother   . Cancer Brother   . Heart disease Sister   . Cancer Maternal Grandfather   . Colon cancer Neg Hx      Review of Systems  All other systems reviewed and are negative.   Objective:  Physical Exam  Vital signs in last 24 hours: Pulse Rate:  [69] 69 (07/24 0917) BP: (99)/(63) 99/63 (07/24 0917)  Weight:  [209 lb (94.8 kg)] 209 lb (94.8 kg) (07/24 0917)  Labs:   Estimated body mass index is 34.78 kg/m as calculated from the following:   Height as of 08/14/16: 5\' 5"  (1.651 m).   Weight as of 08/14/16: 209 lb (94.8 kg).   Imaging Review Plain radiographs demonstrate moderate degenerative joint disease of the right knee(s). The overall alignment ismild varus. The bone quality appears to be adequate for age and reported activity level.  Assessment/Plan:  End stage arthritis, right knee   The patient history, physical examination, clinical judgment of the provider and imaging studies are consistent with end stage degenerative joint disease of the right knee(s) and total knee arthroplasty is deemed medically necessary. The treatment options including medical management, injection therapy arthroscopy and arthroplasty were discussed at length. The risks and benefits of total knee arthroplasty were presented and reviewed. The risks due to aseptic loosening, infection, stiffness, patella tracking problems, thromboembolic complications and other imponderables were discussed. The patient acknowledged the explanation, agreed to proceed with the plan and consent was signed.  Patient is being admitted for inpatient treatment for surgery, pain control, PT, OT, prophylactic antibiotics, VTE prophylaxis, progressive ambulation and ADL's and discharge planning. The patient is planning to be discharged home with home health services

## 2016-08-15 NOTE — Anesthesia Postprocedure Evaluation (Signed)
Anesthesia Post Note  Patient: Debbie Pineda  Procedure(s) Performed: Procedure(s) (LRB): RIGHT TOTAL KNEE ARTHROPLASTY, REMOVAL ACL SCREWS (Right)     Patient location during evaluation: PACU Anesthesia Type: General Level of consciousness: awake and sedated Pain management: pain level controlled Vital Signs Assessment: post-procedure vital signs reviewed and stable Respiratory status: spontaneous breathing Cardiovascular status: stable Postop Assessment: patient able to bend at knees and no signs of nausea or vomiting Anesthetic complications: no    Last Vitals:  Vitals:   08/15/16 1115 08/15/16 1130  BP: 130/85 128/74  Pulse: (!) 51 60  Resp: 15 20  Temp:      Last Pain:  Vitals:   08/15/16 1130  TempSrc:   PainSc: Asleep   Pain Goal: Patients Stated Pain Goal: 3 (08/15/16 0720)               Lajoyce Tamura JR,JOHN Mateo Flow

## 2016-08-15 NOTE — Op Note (Signed)
DATE OF SURGERY:  08/15/2016  TIME: 10:30 AM  PATIENT NAME:  Debbie Pineda    AGE: 56 y.o.    PRE-OPERATIVE DIAGNOSIS:  Osteoarthritis Right Knee  POST-OPERATIVE DIAGNOSIS:  Osteoarthritis Right Knee  PROCEDURE:  Procedure(s): RIGHT TOTAL KNEE ARTHROPLASTY, REMOVAL ACL SCREWS  SURGEON: Meridee Score  ASSISTANT: Justin  OPERATIVE IMPLANTS: Depuy , Posterior Stabilized.  Femur size 5, Tibia size 5, Patella size 32 3-peg oval button, with a 6 mm polyethylene insert.   PREOPERATIVE INDICATIONS:   Debbie Pineda is a 56 y.o. year old female with end stage degenerative arthritis of the knee who failed conservative treatment and elected for Total Knee Arthroplasty.   The risks, benefits, and alternatives were discussed at length including but not limited to the risks of infection, bleeding, nerve injury, stiffness, blood clots, the need for revision surgery, cardiopulmonary complications, among others, and they were willing to proceed.  OPERATIVE DESCRIPTION:  The patient was brought to the operative room and placed in a supine position.  General anesthesia was administered.  IV antibiotics were given.  The lower extremity was prepped and draped in the usual sterile fashion.  Charlie Pitter was used to cover all exposed skin. Time out was performed.    Anterior quadriceps tendon splitting approach was performed.  The patella was everted and osteophytes were removed.  The anterior horn of the medial and lateral meniscus was removed.   The distal femur was opened with the drill and the intramedullary distal femoral cutting jig was utilized, set at 5 degrees valgus resecting 27mm off the distal femur.  Care was taken to protect the collateral ligaments.  Then the extramedullary tibial cutting jig was utilized making the appropriate cut using the anterior tibial crest as a reference building in appropriate posterior slope.  Care was taken during the cut to protect the medial and collateral  ligaments.  The proximal tibia was removed along with the posterior horns of the menisci.  The PCL was sacrificed.    The extensor gap was measured and was approximately 1mm.    The distal femoral sizing jig was applied, taking care to avoid notching.  Then the 4-in-1 cutting jig was applied and the anterior and posterior femur was cut, along with the chamfer cuts.  All posterior osteophytes were removed.  The flexion gap was then measured and was symmetric with the extension gap.  The distal femoral preparation using the appropriate jig to prepare the box.  The patella was then measured, and cut with the saw.    The proximal tibia sized and prepared accordingly with the reamer and the punch, and then all components were trialed with the 53mm poly insert.  The knee was found to have stable balance and full motion.  The knee was irrigated with normal saline and the knee was soaked with TXA.  The above named components were then cemented into place and all excess cement was removed.  The final polyethylene component was in place during cementation.  The knee was  taken through a range of motion and the patella tracked well and the knee irrigated copiously and the parapatellar and subcutaneous tissue closed with vicryl, and skin closed with staples..  A sterile dressing was applied and patient  was taken to the PACU in stable  condition.  There were no complications.  Total tourniquet time was 36 minutes.  Topical TXA 2 g. IV TXA not use due to history of DVT

## 2016-08-15 NOTE — Transfer of Care (Signed)
Immediate Anesthesia Transfer of Care Note  Patient: Debbie Pineda  Procedure(s) Performed: Procedure(s): RIGHT TOTAL KNEE ARTHROPLASTY, REMOVAL ACL SCREWS (Right)  Patient Location: PACU  Anesthesia Type:GA combined with regional for post-op pain  Level of Consciousness: drowsy and patient cooperative  Airway & Oxygen Therapy: Patient Spontanous Breathing and Patient connected to face mask oxygen  Post-op Assessment: Report given to RN and Post -op Vital signs reviewed and stable  Post vital signs: Reviewed and stable  Last Vitals:  Vitals:   08/15/16 0835 08/15/16 1052  BP: (!) 104/53   Pulse: 62   Resp: 16   Temp:  36.5 C    Last Pain:  Vitals:   08/15/16 0720  TempSrc: Oral  PainSc: 3       Patients Stated Pain Goal: 3 (58/25/18 9842)  Complications: No apparent anesthesia complications

## 2016-08-16 ENCOUNTER — Encounter (HOSPITAL_COMMUNITY): Payer: Self-pay | Admitting: Orthopedic Surgery

## 2016-08-16 ENCOUNTER — Inpatient Hospital Stay (HOSPITAL_COMMUNITY): Payer: PPO

## 2016-08-16 DIAGNOSIS — M79609 Pain in unspecified limb: Secondary | ICD-10-CM

## 2016-08-16 MED ORDER — RIVAROXABAN 20 MG PO TABS
20.0000 mg | ORAL_TABLET | Freq: Every day | ORAL | Status: DC
  Administered 2016-08-16: 20 mg via ORAL
  Filled 2016-08-16: qty 1

## 2016-08-16 NOTE — Progress Notes (Signed)
Patient reporting pain in the center posterior side of her right lower leg. States she has had a DVT in the past and this feels like a similar pain. Called Dr. Sharol Given to notify, and received a verbal order for a vascular ultrasound of the RLE. Notified family and nurse tech to not apply SCD sleeve to RLE until DVT is ruled out.

## 2016-08-16 NOTE — Evaluation (Signed)
Occupational Therapy Evaluation Patient Details Name: Debbie Pineda MRN: [id number] DOB: [birth year] Today's Date: 08/16/2016    History of Present Illness Pt is a 56 y/o female s/p elective R TKA. PMH includes CVA, DVT, fibromyalgia, LBP, sleep apnea, heart palpitations, patent foramen ovale, memory disorder, and s/p L TKA, and R ACL surgery.    Clinical Impression   Pt admitted with the above diagnoses and presents with below problem list. Pt will benefit from continued acute OT to address the below listed deficits and maximize independence with basic ADLs prior to d/c home. PTA pt was mod I with ADLs. Pt is currently min guard with LB ADLs and functional transfers/mobility. OT session dovetailed with end of PT session. Pt had just returned from ambulating in the halls with PT. Pt reporting pain and requesting to return to bed.  Plan to work on functional transfers next session (toilet/shower).      Follow Up Recommendations  No OT follow up;Supervision/Assistance - 24 hour    Equipment Recommendations  None recommended by OT    Recommendations for Other Services       Precautions / Restrictions Precautions Precautions: Knee Precaution Booklet Issued: Yes (comment) Precaution Comments: reviewed Restrictions Weight Bearing Restrictions: Yes RLE Weight Bearing: Weight bearing as tolerated      Mobility Bed Mobility Overal bed mobility: Needs Assistance Bed Mobility: Sit to Supine     Supine to sit: Min guard Sit to supine: Min guard   General bed mobility comments: Min guard, pt with good technique using UE to manage R LE onto and off of bed.  Transfers Overall transfer level: Needs assistance Equipment used: Rolling walker (2 wheeled) Transfers: Sit to/from Stand Sit to Stand: Min guard         General transfer comment: Min guard for safety. Demonstrated safe hand placement.     Balance Overall balance assessment: Needs assistance Sitting-balance support: No  upper extremity supported;Feet supported Sitting balance-Leahy Scale: Good     Standing balance support: Bilateral upper extremity supported;During functional activity Standing balance-Leahy Scale: Fair                             ADL either performed or assessed with clinical judgement   ADL Overall ADL's : Needs assistance/impaired Eating/Feeding: Set up;Sitting   Grooming: Set up;Sitting;Min guard;Standing   Upper Body Bathing: Set up;Sitting   Lower Body Bathing: Sit to/from stand;Min guard   Upper Body Dressing : Set up;Sitting   Lower Body Dressing: Sit to/from stand;Min guard   Toilet Transfer: Min guard;Ambulation;RW (3n1 over toilet)   Toileting- Clothing Manipulation and Hygiene: Min guard;Sit to/from stand   Tub/ Shower Transfer: Min guard;Ambulation;Shower seat;3 in Mudlogger Details (indicate cue type and reason): discussed transfer techniques for walkin and tub shower. Gave handout on tub shower transfer with 3n1 as shower seat. Functional mobility during ADLs: Min guard;Rolling walker General ADL Comments: ADL education reviewed including LB ADLs and shower transfer technique.      Vision         Perception     Praxis      Pertinent Vitals/Pain Pain Assessment: Faces Pain Score: 6  Faces Pain Scale: Hurts even more Pain Location: R knee  Pain Descriptors / Indicators: Sore;Operative site guarding Pain Intervention(s): Limited activity within patient's tolerance;Monitored during session;Repositioned;Patient requesting pain meds-RN notified     Hand Dominance Right   Extremity/Trunk Assessment Upper Extremity Assessment Upper Extremity Assessment:  Overall [facility] for tasks assessed   Lower Extremity Assessment Lower Extremity Assessment: Defer to PT evaluation   Cervical / Trunk Assessment Cervical / Trunk Assessment: Normal   Communication Communication Communication: No difficulties   Cognition  Arousal/Alertness: Awake/alert Behavior During Therapy: WFL for tasks assessed/performed Overall Cognitive Status: Within Functional Limits for tasks assessed                                     General Comments       Exercises Exercises: Total Joint Total Joint Exercises Heel Slides: AROM;Right;10 reps;Seated (towel under foot) Long Arc Quad: AROM;Right;10 reps;Seated Knee Flexion: AROM;Right;10 reps;Seated (10 second holds)   Shoulder Instructions      Home Living Family/patient expects to be discharged to:: Private residence Living Arrangements: Spouse/significant other Available Help at Discharge: Family;Available 24 hours/day Type of Home: House Home Access: Stairs to enter [employer] of Steps: 1 Entrance Stairs-Rails: None Home Layout: Two level;Able to live on main level with bedroom/bathroom     Bathroom Shower/Tub: Walk-in shower;Tub/shower unit   Bathroom Toilet: Handicapped height     Home Equipment: Walker - 2 wheels;Crutches;Cane - single point;Bedside commode   Additional Comments: Pt will be staying at a house with a walkin shower for first few days then going home where she has a tub shower.       Prior Functioning/Environment Level of Independence: Independent with assistive device(s)        Comments: Used the cane or RW.         OT Problem List: Impaired balance (sitting and/or standing);Decreased knowledge of use of DME or AE;Decreased knowledge of precautions;Pain      OT Treatment/Interventions: Self-care/ADL training;DME and/or AE instruction;Therapeutic activities;Patient/family education;Balance training    OT Goals(Current goals can be found in the care plan section) Acute Rehab OT Goals Patient Stated Goal: to go home  OT Goal Formulation: With patient Time For Goal Achievement: 08/23/16 Potential to Achieve Goals: Good ADL Goals Pt Will Perform Lower Body Bathing: with supervision;sit to/from stand Pt  Will Perform Lower Body Dressing: with supervision;sit to/from stand Pt Will Transfer to Toilet: with supervision;ambulating Pt Will Perform Toileting - Clothing Manipulation and hygiene: with supervision;sit to/from stand Pt Will Perform Tub/Shower Transfer: with supervision;ambulating;3 in 1;shower seat;rolling walker  OT Frequency: Min 2X/week   Barriers to D/C:            Co-evaluation              AM-PAC PT "6 Clicks" Daily Activity     Outcome Measure Help from another person eating meals?: None Help from another person taking care of personal grooming?: None Help from another person toileting, which includes using toliet, bedpan, or urinal?: A Little Help from another person bathing (including washing, rinsing, drying)?: A Little Help from another person to put on and taking off regular upper body clothing?: None Help from another person to put on and taking off regular lower body clothing?: None 6 Click Score: 22   End of Session Equipment Utilized During Treatment: Gait belt;Rolling walker Nurse Communication: Patient requests pain meds  Activity Tolerance: Patient tolerated treatment well;Patient limited by pain Patient left: in bed;with call bell/phone within reach;with SCD's reapplied  OT Visit Diagnosis: Other abnormalities of gait and mobility (R26.89);Pain Pain - Right/Left: Right Pain - part of body: Knee  Time: 2585-2778 OT Time Calculation (min): 12 min Charges:  OT General Charges $OT Visit: 1 Procedure OT Evaluation $OT Eval Low Complexity: 1 Procedure G-Codes:       Hortencia Pilar 08/16/2016, 10:54 AM

## 2016-08-16 NOTE — Progress Notes (Signed)
*  Preliminary Results* Right lower extremity venous duplex completed. Right lower extremity is negative for deep vein thrombosis. There is no evidence of right Baker's cyst.  08/16/2016 1:53 PM  Maudry Mayhew, BS, RVT, RDCS, RDMS

## 2016-08-16 NOTE — Care Management Note (Signed)
Case Management Note  Patient Details  Name: Debbie Pineda MRN: [id number] Date of Birth: 1960-02-04  Subjective/Objective:   S/p elective R TKA, removal of ACL screws                 Action/Plan: Discharge Planning: NCM spoke to pt and states she had [facility] in the past for [facility]. Contacted [facility] for HHPT. Will need HH orders with F2F. Message left for attending. Pt has RW and 3n1 at home. She will be staying with mother and then going back to her home.   Address # [address], [place] [address]   PCP Jake Samples MD  Expected Discharge Date:                  Expected Discharge Plan:  [place]  In-House Referral:  NA  Discharge planning Services  CM Consult  Post Acute Care Choice:  Home Health Choice offered to:  Patient  DME Arranged:  N/A DME Agency:  NA  HH Arranged:  PT [place] Agency:  [place]  Status of Service:  Completed, signed off  If discussed at [place] of Stay Meetings, dates discussed:    Additional Comments:  Erenest Rasher, RN 08/16/2016, 12:52 PM

## 2016-08-16 NOTE — Progress Notes (Signed)
PT Cancellation Note  Patient Details Name: Debbie Pineda MRN: [id number] DOB: 09-05-1960   Cancelled Treatment:    Reason Eval/Treat Not Completed: Patient at procedure or test/unavailable. Pt at doppler for possible RLE DVT. Will check back if time allows.  Benjiman Core, PTA Pager [phone] Acute Rehab   Allena Katz 08/16/2016, 1:41 PM

## 2016-08-16 NOTE — Progress Notes (Signed)
Physical Therapy Treatment Patient Details Name: Debbie Pineda MRN: [id number] DOB: [birth year] Today's Date: 08/16/2016    History of Present Illness Pt is a 56 y/o female s/p elective R TKA. PMH includes CVA, DVT, fibromyalgia, LBP, sleep apnea, heart palpitations, patent foramen ovale, memory disorder, and s/p L TKA, and R ACL surgery.     PT Comments    Pt reported walking 3x with husband yesterday in hall. She is mobilizing well and increased ambulation distance today. Plan to focus on stair training for PM session. Will continue to follow acutely to maximize functional independence.  Follow Up Recommendations  DC plan and follow up therapy as arranged by surgeon;Supervision/Assistance - 24 hour     Equipment Recommendations  None recommended by PT (has all DME )    Recommendations for Other Services       Precautions / Restrictions Precautions Precautions: Knee Precaution Booklet Issued: Yes (comment) Precaution Comments: Reviewed seated ther ex with pt.  Restrictions Weight Bearing Restrictions: Yes RLE Weight Bearing: Weight bearing as tolerated    Mobility  Bed Mobility Overal bed mobility: Needs Assistance Bed Mobility: Supine to Sit;Sit to Supine     Supine to sit: Min guard Sit to supine: Min guard   General bed mobility comments: Min guard, pt with good technique using UE to manage R LE onto and off of bed.  Transfers Overall transfer level: Needs assistance Equipment used: Rolling walker (2 wheeled) Transfers: Sit to/from Stand Sit to Stand: Min guard         General transfer comment: Min guard for safety. Demonstrated safe hand placement.   Ambulation/Gait Ambulation/Gait assistance: Min guard Ambulation Distance (Feet): 230 Feet Assistive device: Rolling walker (2 wheeled) Gait Pattern/deviations: Step-to pattern;Step-through pattern;Decreased step length - right;Decreased step length - left;Decreased stride length;Decreased weight shift to  right;Antalgic Gait velocity: Decreased Gait velocity interpretation: Below normal speed for age/gender General Gait Details: Slow, antalgic gait. Pt able to progress to step through pattern with cueing, but reverted to step to when fatigued. Pt required cues for appropriate heel strike as well.    Stairs            Wheelchair Mobility    Modified Rankin (Stroke Patients Only)       Balance Overall balance assessment: Needs assistance Sitting-balance support: No upper extremity supported;Feet supported Sitting balance-Leahy Scale: Good     Standing balance support: Bilateral upper extremity supported;During functional activity Standing balance-Leahy Scale: Fair                              Cognition Arousal/Alertness: Awake/alert Behavior During Therapy: WFL for tasks assessed/performed Overall Cognitive Status: Within Functional Limits for tasks assessed                                        Exercises Total Joint Exercises Heel Slides: AROM;Right;10 reps;Seated (towel under foot) Long Arc Quad: AROM;Right;10 reps;Seated Knee Flexion: AROM;Right;10 reps;Seated (10 second holds)    General Comments        Pertinent Vitals/Pain Pain Assessment: 0-10 Pain Score: 6  Pain Location: R knee  Pain Descriptors / Indicators: Sore;Operative site guarding Pain Intervention(s): Monitored during session;Limited activity within patient's tolerance;Premedicated before session;Repositioned    Home Living  Prior Function            PT Goals (current goals can now be found in the care plan section) Acute Rehab PT Goals Patient Stated Goal: to go home  PT Goal Formulation: With patient Time For Goal Achievement: 08/22/16 Potential to Achieve Goals: Good Progress towards PT goals: Progressing toward goals    Frequency    7X/week      PT Plan Current plan remains appropriate    Co-evaluation               AM-PAC PT "6 Clicks" Daily Activity  Outcome Measure  Difficulty turning over in bed (including adjusting bedclothes, sheets and blankets)?: A Little Difficulty moving from lying on back to sitting on the side of the bed? : Total Difficulty sitting down on and standing up from a chair with arms (e.g., wheelchair, bedside commode, etc,.)?: Total Help needed moving to and from a bed to chair (including a wheelchair)?: A Little Help needed walking in hospital room?: A Little Help needed climbing 3-5 steps with a railing? : A Little 6 Click Score: 14    End of Session Equipment Utilized During Treatment: Gait belt Activity Tolerance: Patient tolerated treatment well Patient left: with call bell/phone within reach;in bed;Other (comment) (OT present) Nurse Communication: Mobility status PT Visit Diagnosis: Other abnormalities of gait and mobility (R26.89);Pain Pain - Right/Left: Right Pain - part of body: Knee     Time: 5749-3552 PT Time Calculation (min) (ACUTE ONLY): 34 min  Charges:  $Gait Training: 8-22 mins $Therapeutic Exercise: 8-22 mins                    G Codes:       Benjiman Core, [place] Pager [phone] Acute Rehab    Allena Katz 08/16/2016, 10:39 AM

## 2016-08-16 NOTE — Progress Notes (Signed)
Patient ID: Debbie Pineda, female   DOB: [birth year], 56 y.o.   MRN: [id number] Postoperative day 1 total knee arthroplasty. Patient is comfortable this morning. Plan for discharge to home on Friday. Patient requested advanced home care for home health therapy and requested a specific therapist.

## 2016-08-16 NOTE — Progress Notes (Signed)
Physical Therapy Treatment Patient Details Name: Debbie Pineda MRN: [id number] DOB: [birth year] Today's Date: 08/16/2016    History of Present Illness Pt is a 56 y/o female s/p elective R TKA. PMH includes CVA, DVT, fibromyalgia, LBP, sleep apnea, heart palpitations, patent foramen ovale, memory disorder, and s/p L TKA, and R ACL surgery.     PT Comments    Pt continues to show improvement in mobility as she was able to increase ambulation distance today. Quality of gait quickly decreases as pt fatigues. Pt has reviewed all exercises in HEP and negotiated steps. Plan to focus on increasing activity tolerance and quality of gait. Will continue to follow acutely.    Follow Up Recommendations  DC plan and follow up therapy as arranged by surgeon;Supervision/Assistance - 24 hour     Equipment Recommendations  None recommended by PT (has all DME )    Recommendations for Other Services       Precautions / Restrictions Precautions Precautions: Knee Precaution Booklet Issued: Yes (comment) Restrictions Weight Bearing Restrictions: Yes RLE Weight Bearing: Weight bearing as tolerated    Mobility  Bed Mobility Overal bed mobility: Needs Assistance Bed Mobility: Sit to Supine       Sit to supine: Min assist   General bed mobility comments: min A to progress R LE onto bed this PM session due to pain.  Transfers Overall transfer level: Needs assistance Equipment used: Rolling walker (2 wheeled) Transfers: Sit to/from Stand Sit to Stand: Supervision         General transfer comment: supervision for safety. Demonstrated safe hand placement.   Ambulation/Gait Ambulation/Gait assistance: Min guard Ambulation Distance (Feet): 270 Feet Assistive device: Rolling walker (2 wheeled) Gait Pattern/deviations: Step-to pattern;Step-through pattern;Decreased step length - right;Decreased step length - left;Decreased stride length;Decreased weight shift to right;Antalgic Gait velocity:  Decreased Gait velocity interpretation: Below normal speed for age/gender General Gait Details: Slow, antalgic gait. Began with step through pattern but reverted to step to when fatigued.    Stairs Stairs: Yes   Stair Management: No rails;One rail Right;Step to pattern;With walker;With cane Number of Stairs: 3 General stair comments: Pt with 1 curb step to enter mother's home where she will recover for 2-3 weeks.  Pt requested practicing steps similer to those at her home where she will return after recovering at pts mother's home. Pt used cane on L and 1 rail on the right to negotiate steps. Cueing for technique and sequencing.  Wheelchair Mobility    Modified Rankin (Stroke Patients Only)       Balance Overall balance assessment: Needs assistance Sitting-balance support: No upper extremity supported;Feet supported Sitting balance-Leahy Scale: Good     Standing balance support: Bilateral upper extremity supported;During functional activity Standing balance-Leahy Scale: Fair Standing balance comment: Reliant on BUE support                             Cognition Arousal/Alertness: Awake/alert Behavior During Therapy: WFL for tasks assessed/performed Overall Cognitive Status: Within Functional Limits for tasks assessed                                        Exercises      General Comments General comments (skin integrity, edema, etc.): pt daughter present and helpful.      Pertinent Vitals/Pain Pain Assessment: Faces Faces Pain Scale: Hurts even more (  after ambulation) Pain Location: R knee  Pain Descriptors / Indicators: Sore;Operative site guarding Pain Intervention(s): Monitored during session;Limited activity within patient's tolerance    Home Living                      Prior Function            PT Goals (current goals can now be found in the care plan section) Acute Rehab PT Goals Patient Stated Goal: to go home  PT  Goal Formulation: With patient Time For Goal Achievement: 08/22/16 Potential to Achieve Goals: Good Progress towards PT goals: Progressing toward goals    Frequency    7X/week      PT Plan Current plan remains appropriate    Co-evaluation              AM-PAC PT "6 Clicks" Daily Activity  Outcome Measure  Difficulty turning over in bed (including adjusting bedclothes, sheets and blankets)?: A Little Difficulty moving from lying on back to sitting on the side of the bed? : Total Difficulty sitting down on and standing up from a chair with arms (e.g., wheelchair, bedside commode, etc,.)?: Total Help needed moving to and from a bed to chair (including a wheelchair)?: A Little Help needed walking in hospital room?: A Little Help needed climbing 3-5 steps with a railing? : A Little 6 Click Score: 14    End of Session Equipment Utilized During Treatment: Gait belt Activity Tolerance: Patient tolerated treatment well Patient left: with call bell/phone within reach;in bed;Other (comment) (OT present) Nurse Communication: Mobility status;Patient requests pain meds PT Visit Diagnosis: Other abnormalities of gait and mobility (R26.89);Pain Pain - Right/Left: Right Pain - part of body: Knee     Time: 1761-6073 PT Time Calculation (min) (ACUTE ONLY): 34 min  Charges:  $Gait Training: 23-37 mins                    G Codes:      Benjiman Core, [place] Pager [phone] Acute Rehab   Allena Katz 08/16/2016, 3:39 PM

## 2016-08-17 MED ORDER — OXYCODONE-ACETAMINOPHEN 10-325 MG PO TABS: 1.0000 | ORAL_TABLET | Freq: Three times a day (TID) | ORAL | 0 refills | Status: DC | PRN

## 2016-08-17 NOTE — Progress Notes (Signed)
Pt was discharged at 1354. Prior to DC, this RN discussed AVS, changed wound dressing, gave pt prescriptions and removed PIV. Pt left with NT via wheelchair.

## 2016-08-17 NOTE — Progress Notes (Signed)
Physical Therapy Treatment Patient Details Name: Debbie Pineda MRN: [id number] DOB: 1961/01/15 Today's Date: 08/17/2016    History of Present Illness Pt is a 56 y/o female s/p elective R TKA. PMH includes CVA, DVT, fibromyalgia, LBP, sleep apnea, heart palpitations, patent foramen ovale, memory disorder, and s/p L TKA, and R ACL surgery.     PT Comments    Patient is progressing toward mobility goals. Pt able to safely negotiate stairs with min guard assist. Husband present throughout session. Current plan remains appropriate.    Follow Up Recommendations  DC plan and follow up therapy as arranged by surgeon;Supervision/Assistance - 24 hour     Equipment Recommendations  None recommended by PT (has all DME )    Recommendations for Other Services       Precautions / Restrictions Precautions Precautions: Knee Precaution Comments: reviewed positioning and precautions with pt and husband Restrictions Weight Bearing Restrictions: Yes RLE Weight Bearing: Weight bearing as tolerated    Mobility  Bed Mobility Overal bed mobility: Modified Independent Bed Mobility: Supine to Sit           General bed mobility comments: increased time and effort  Transfers Overall transfer level: Needs assistance Equipment used: Rolling walker (2 wheeled) Transfers: Sit to/from Stand Sit to Stand: Supervision         General transfer comment: supervision for safety. Demonstrated safe hand placement.   Ambulation/Gait Ambulation/Gait assistance: Min guard Ambulation Distance (Feet):  (112ft X2) Assistive device: Rolling walker (2 wheeled) Gait Pattern/deviations: Antalgic;Step-through pattern;Decreased stance time - right;Decreased step length - left;Decreased dorsiflexion - right;Trunk flexed Gait velocity: Decreased   General Gait Details: cues for R heel strike, R quad activation during stance phase, and posture; seated rest break   Stairs     Stair Management: No rails;One  rail Right;Step to pattern;With cane;Sideways;Forwards Number of Stairs:  (3steps X2) General stair comments: cues for sequencing and technique; practiced forward with rail and cane and sideways with single rail  Wheelchair Mobility    Modified Rankin (Stroke Patients Only)       Balance Overall balance assessment: Needs assistance Sitting-balance support: No upper extremity supported;Feet supported Sitting balance-Leahy Scale: Good     Standing balance support: Bilateral upper extremity supported;During functional activity Standing balance-Leahy Scale: Fair Standing balance comment: Reliant on BUE support                             Cognition Arousal/Alertness: Awake/alert Behavior During Therapy: WFL for tasks assessed/performed Overall Cognitive Status: Within Functional Limits for tasks assessed                                        Exercises      General Comments General comments (skin integrity, edema, etc.): husband present      Pertinent Vitals/Pain Pain Assessment: 0-10 Pain Score: 4  Pain Location: R knee  Pain Descriptors / Indicators: Sore;Operative site guarding Pain Intervention(s): Limited activity within patient's tolerance;Monitored during session;Premedicated before session;Repositioned    Home Living                      Prior Function            PT Goals (current goals can now be found in the care plan section) Acute Rehab PT Goals Patient Stated Goal: to go home  PT Goal Formulation: With patient Time For Goal Achievement: 08/22/16 Potential to Achieve Goals: Good Progress towards PT goals: Progressing toward goals    Frequency    7X/week      PT Plan Current plan remains appropriate    Co-evaluation              AM-PAC PT "6 Clicks" Daily Activity  Outcome Measure  Difficulty turning over in bed (including adjusting bedclothes, sheets and blankets)?: A Little Difficulty moving  from lying on back to sitting on the side of the bed? : A Lot Difficulty sitting down on and standing up from a chair with arms (e.g., wheelchair, bedside commode, etc,.)?: A Lot Help needed moving to and from a bed to chair (including a wheelchair)?: A Little Help needed walking in hospital room?: A Little Help needed climbing 3-5 steps with a railing? : A Little 6 Click Score: 16    End of Session Equipment Utilized During Treatment: Gait belt Activity Tolerance: Patient tolerated treatment well Patient left: with call bell/phone within reach;in bed;Other (comment) (OT present) Nurse Communication: Mobility status;Patient requests pain meds PT Visit Diagnosis: Other abnormalities of gait and mobility (R26.89);Pain Pain - Right/Left: Right Pain - part of body: Knee     Time: 9509-3267 PT Time Calculation (min) (ACUTE ONLY): 27 min  Charges:  $Gait Training: 8-22 mins $Therapeutic Activity: 8-22 mins                    G Codes:       Earney Navy, PTA Pager: [phone]     Darliss Cheney 08/17/2016, 1:44 PM

## 2016-08-17 NOTE — Progress Notes (Signed)
Occupational Therapy Treatment Patient Details Name: Debbie Pineda MRN: [id number] DOB: 04-22-1960 Today's Date: 08/17/2016    History of present illness Pt is a 56 y/o female s/p elective R TKA. PMH includes CVA, DVT, fibromyalgia, LBP, sleep apnea, heart palpitations, patent foramen ovale, memory disorder, and s/p L TKA, and R ACL surgery.    OT comments  Pt is adequate level for d/c home and will have spouse (A). No further Ot needs.   Follow Up Recommendations  No OT follow up;Supervision/Assistance - 24 hour    Equipment Recommendations  None recommended by OT    Recommendations for Other Services      Precautions / Restrictions Precautions Precautions: Knee Precaution Comments: reviewed positioning and precautions with pt and husband Restrictions Weight Bearing Restrictions: No RLE Weight Bearing: Weight bearing as tolerated       Mobility Bed Mobility Overal bed mobility: Modified Independent Bed Mobility: Supine to Sit           General bed mobility comments: received at the door iwth PTA Kellyn  Transfers Overall transfer level: Modified independent Equipment used: Rolling walker (2 wheeled) Transfers: Sit to/from Stand Sit to Stand: Supervision         General transfer comment: supervision for safety. Demonstrated safe hand placement.     Balance Overall balance assessment: Modified Independent Sitting-balance support: No upper extremity supported;Feet supported Sitting balance-Leahy Scale: Good     Standing balance support: Bilateral upper extremity supported;During functional activity Standing balance-Leahy Scale: Fair Standing balance comment: Reliant on BUE support                            ADL either performed or assessed with clinical judgement   ADL Overall ADL's : Modified independent                         Toilet Transfer: Modified Independent       Tub/ Shower Transfer: Min guard     General ADL  Comments: pt and spouse verbalized car transfer 100% correct Pt educated on bathing and avoid washing directly on incision. Pt educated to use new wash cloth and towel each day. Pt educated to allow water to run across dressing and not to soak in a tub at this time. r.       Vision   Vision Assessment?: No apparent visual deficits   Perception     Praxis      Cognition Arousal/Alertness: Awake/alert Behavior During Therapy: WFL for tasks assessed/performed Overall Cognitive Status: Within Functional Limits for tasks assessed                                          Exercises     Shoulder Instructions       General Comments husband present    Pertinent Vitals/ Pain       Pain Assessment: 0-10 Pain Score: 4  Pain Location: R knee  Pain Descriptors / Indicators: Sore;Operative site guarding Pain Intervention(s): Premedicated before session;Repositioned  Home Living                                          Prior Functioning/Environment  Frequency  Min 2X/week        Progress Toward Goals  OT Goals(current goals can now be found in the care plan section)  Progress towards OT goals: Goals met/education completed, patient discharged from OT  Acute Rehab OT Goals Patient Stated Goal: to go home  OT Goal Formulation: With patient Time For Goal Achievement: 08/23/16 Potential to Achieve Goals: Good ADL Goals Pt Will Perform Lower Body Bathing: with supervision;sit to/from stand Pt Will Perform Lower Body Dressing: with supervision;sit to/from stand Pt Will Transfer to Toilet: with supervision;ambulating Pt Will Perform Toileting - Clothing Manipulation and hygiene: with supervision;sit to/from stand Pt Will Perform Tub/Shower Transfer: with supervision;ambulating;3 in 1;shower seat;rolling walker  Plan Discharge plan remains appropriate    Co-evaluation                 AM-PAC PT "6 Clicks" Daily  Activity     Outcome Measure   Help from another person eating meals?: None Help from another person taking care of personal grooming?: None Help from another person toileting, which includes using toliet, bedpan, or urinal?: A Little Help from another person bathing (including washing, rinsing, drying)?: A Little Help from another person to put on and taking off regular upper body clothing?: None Help from another person to put on and taking off regular lower body clothing?: None 6 Click Score: 22    End of Session Equipment Utilized During Treatment: Gait belt;Rolling walker  OT Visit Diagnosis: Other abnormalities of gait and mobility (R26.89);Pain Pain - Right/Left: Right Pain - part of body: Knee   Activity Tolerance Patient tolerated treatment well;Patient limited by pain   Patient Left with call bell/phone within reach;in chair;with family/visitor present   Nurse Communication Mobility status;Precautions        Time: 3668-1594 OT Time Calculation (min): 11 min  Charges:     Jeri Modena   OTR/L Pager: [phone] Office: [phone] .    Parke Poisson B 08/17/2016, 4:17 PM

## 2016-08-17 NOTE — Progress Notes (Signed)
Placed patient on CPAP for the night with pressure set at 9cm.

## 2016-08-17 NOTE — Discharge Summary (Signed)
Discharge Diagnoses:  Active Problems:   Presence of retained hardware   Total knee replacement status, right   Surgeries: Procedure(s): RIGHT TOTAL KNEE ARTHROPLASTY, REMOVAL ACL SCREWS on 08/15/2016    Consultants:   Discharged Condition: Improved  Hospital Course: Debbie Pineda is an 56 y.o. female who was admitted 08/15/2016 with a chief complaint of osteoarthritis right knee, with a final diagnosis of Osteoarthritis Right Knee.  Patient was brought to the operating room on 08/15/2016 and underwent Procedure(s): RIGHT TOTAL KNEE ARTHROPLASTY, REMOVAL ACL SCREWS.    Patient was given perioperative antibiotics: Anti-infectives    Start     Dose/Rate Route Frequency Ordered Stop   08/15/16 1500  ceFAZolin (ANCEF) IVPB 2g/100 mL premix     2 g 200 mL/hr over 30 Minutes Intravenous Every 6 hours 08/15/16 1307 08/15/16 2246   08/15/16 0713  ceFAZolin (ANCEF) IVPB 2g/100 mL premix     2 g 200 mL/hr over 30 Minutes Intravenous On call to O.R. 08/15/16 7124 08/15/16 0925   08/15/16 0657  ceFAZolin (ANCEF) 2-4 GM/100ML-% IVPB    Comments:  Tamsen Snider   : cabinet override      08/15/16 0657 08/15/16 0925    .  Patient was given sequential compression devices, early ambulation, and aspirin for DVT prophylaxis.  Recent vital signs: Patient Vitals for the past 24 hrs:  BP Temp Temp src Pulse Resp SpO2  08/17/16 0600 111/68 100.1 F (37.8 C) Oral 92 18 93 %  08/17/16 0035 - - - 71 16 98 %  08/16/16 2201 (!) 90/45 99.7 F (37.6 C) Oral 67 18 100 %  08/16/16 1452 (!) 120/56 98.3 F (36.8 C) Tympanic 85 18 97 %  08/16/16 0640 105/61 99.5 F (37.5 C) Oral 83 18 96 %  .  Recent laboratory studies: No results found.  Discharge Medications:   Allergies as of 08/17/2016      Reactions   Lyrica [pregabalin] Other (See Comments)   SOB, lower extremity edema and weight gain   Flexeril [cyclobenzaprine Hcl] Other (See Comments)   Numbness of extremities   Morphine And Related  Itching   Upper torso   Sulfamethoxazole-trimethoprim Itching, Rash   Bactrim   Belsomra [suvorexant] Other (See Comments)   unknown   Tape Itching, Rash   Please use "paper" tape Rash if left on longer than 24 hrs      Medication List    TAKE these medications   B-12 2500 MCG Subl Place 2,500 mcg under the tongue daily.   BENEFIBER PO Take 1 Dose by mouth daily.   BIOFREEZE EX Apply 1 application topically daily as needed (pain).   Biotin 10000 MCG Tabs Take 1 tablet by mouth daily.   CAL-MAG-ZINC-D PO Take 1-2 tablets by mouth See admin instructions. Take 2 tablet at 1200 and 1 tablet at 1600  Calcium 1000 mg Vit D 600 iu Magnesium 400 mg Zinc 15 mg   diclofenac 75 MG EC tablet Commonly known as:  VOLTAREN Take 75 mg by mouth 2 (two) times daily.   diclofenac sodium 1 % Gel Commonly known as:  VOLTAREN Apply 4 g topically 4 (four) times daily as needed (PAIN). Reported on 03/21/2015   DULoxetine 60 MG capsule Commonly known as:  CYMBALTA Take 60 mg by mouth 2 (two) times daily.   estradiol 0.1 MG/GM vaginal cream Commonly known as:  ESTRACE Place 1 Applicatorful vaginally at bedtime.   ferrous sulfate 325 (65 FE) MG tablet Take 325 mg  by mouth daily at 12 noon.   folic acid 144 MCG tablet Commonly known as:  FOLVITE Take 400 mcg by mouth daily.   gabapentin 600 MG tablet Commonly known as:  NEURONTIN Take 600 mg by mouth 3 (three) times daily.   Krill Oil 350 MG Caps Take 350 mg by mouth at bedtime.   levothyroxine 125 MCG tablet Commonly known as:  SYNTHROID, LEVOTHROID Take 125 mcg by mouth daily before breakfast.   LINZESS 72 MCG capsule Generic drug:  linaclotide Take 72 mcg by mouth daily as needed (constipation).   Melatonin 10 MG Tabs Take 20 mg by mouth at bedtime as needed (SLEEP).   methocarbamol 750 MG tablet Commonly known as:  ROBAXIN TAKE 1 TABLET BY MOUTH THREE TIMES DAILY   methylphenidate 20 MG tablet Commonly known as:   RITALIN Take 20 mg by mouth 3 (three) times daily. Takes at 0800, 1200, 1600   misoprostol 100 MCG tablet Commonly known as:  CYTOTEC Take 100 mcg by mouth 2 (two) times daily after a meal. Takes with  diclofenac   multivitamin with minerals Tabs tablet Take 1 tablet by mouth 2 (two) times daily. One a day petites   mupirocin ointment 2 % Commonly known as:  BACTROBAN   MYRBETRIQ 50 MG Tb24 tablet Generic drug:  mirabegron ER Take 50 mg by mouth daily.   omeprazole 40 MG capsule Commonly known as:  PRILOSEC TAKE ONE CAPSULE BY MOUTH TWICE DAILY   ondansetron 4 MG disintegrating tablet Commonly known as:  ZOFRAN-ODT Take 4 mg by mouth every 6 (six) hours as needed for nausea.   oxybutynin 10 MG 24 hr tablet Commonly known as:  DITROPAN-XL Take 10 mg by mouth daily.   Oxycodone HCl 10 MG Tabs TAKE 1 TYABLET BY MOUTH EVERY 4-6 HOURS AS NEEDED FOR PAIN   oxyCODONE-acetaminophen 10-325 MG tablet Commonly known as:  PERCOCET Take 1 tablet by mouth every 8 (eight) hours as needed for pain.   rivaroxaban 20 MG Tabs tablet Commonly known as:  XARELTO Take 1 tablet (20 mg total) by mouth daily with supper. What changed:  when to take this   simethicone 80 MG chewable tablet Commonly known as:  MYLICON Chew 80 mg by mouth as needed for flatulence. Reported on 03/21/2015   topiramate 25 MG tablet Commonly known as:  TOPAMAX Take 25 mg by mouth 2 (two) times daily.   traMADol 50 MG tablet Commonly known as:  ULTRAM Take 50 mg by mouth every 6 (six) hours as needed (FOR BREAK THROUGH PAIN).   traZODone 100 MG tablet Commonly known as:  DESYREL Take 100-300 mg by mouth at bedtime as needed for sleep (depends on insomnia if takes 1-3 tablets).   UNABLE TO FIND Bilateral Knee high 20-30 mmHg compression stockings.   VAYACOG 100-19.5-6.5 MG Caps Generic drug:  Phosphatidylserine-DHA-EPA Take 1 capsule by mouth daily. Reported on 03/21/2015   vitamin C 500 MG  tablet Commonly known as:  ASCORBIC ACID Take 500 mg by mouth daily.   Vitamin D (Ergocalciferol) 50000 units Caps capsule Commonly known as:  DRISDOL Take 50,000 Units by mouth every 7 (seven) days. Mondays       Diagnostic Studies: No results found.  Patient benefited maximally from their hospital stay and there were no complications.     Disposition: 06-Home-Health Care Svc Discharge Instructions    Call MD / Call 911    Complete by:  As directed    If you experience chest  pain or shortness of breath, CALL 911 and be transported to the hospital emergency room.  If you develope a fever above 101 F, pus (white drainage) or increased drainage or redness at the wound, or calf pain, call your surgeon's office.   Constipation Prevention    Complete by:  As directed    Drink plenty of fluids.  Prune juice may be helpful.  You may use a stool softener, such as Colace (over the counter) 100 mg twice a day.  Use MiraLax (over the counter) for constipation as needed.   Diet - low sodium heart healthy    Complete by:  As directed    Increase activity slowly as tolerated    Complete by:  As directed    Weight bearing as tolerated    Complete by:  As directed      Follow-up Information    Newt Minion, MD Follow up in 1 week(s).   Specialty:  Orthopedic Surgery Contact information: [place]  [address] [phone]        [facility] Follow up.   Why:  Home Health Physical Therapy Contact information: [address] [place] [address] [phone]            Signed: Newt Minion 08/17/2016, 6:37 AM

## 2016-08-18 DIAGNOSIS — Z79891 Long term (current) use of opiate analgesic: Secondary | ICD-10-CM | POA: Diagnosis not present

## 2016-08-18 DIAGNOSIS — M545 Low back pain: Secondary | ICD-10-CM | POA: Diagnosis not present

## 2016-08-18 DIAGNOSIS — G4730 Sleep apnea, unspecified: Secondary | ICD-10-CM | POA: Diagnosis not present

## 2016-08-18 DIAGNOSIS — Z87891 Personal history of nicotine dependence: Secondary | ICD-10-CM | POA: Diagnosis not present

## 2016-08-18 DIAGNOSIS — F329 Major depressive disorder, single episode, unspecified: Secondary | ICD-10-CM | POA: Diagnosis not present

## 2016-08-18 DIAGNOSIS — R413 Other amnesia: Secondary | ICD-10-CM | POA: Diagnosis not present

## 2016-08-18 DIAGNOSIS — Z471 Aftercare following joint replacement surgery: Secondary | ICD-10-CM | POA: Diagnosis not present

## 2016-08-18 DIAGNOSIS — Z96651 Presence of right artificial knee joint: Secondary | ICD-10-CM | POA: Diagnosis not present

## 2016-08-18 DIAGNOSIS — Z791 Long term (current) use of non-steroidal anti-inflammatories (NSAID): Secondary | ICD-10-CM | POA: Diagnosis not present

## 2016-08-18 DIAGNOSIS — Z7901 Long term (current) use of anticoagulants: Secondary | ICD-10-CM | POA: Diagnosis not present

## 2016-08-18 DIAGNOSIS — Z8673 Personal history of transient ischemic attack (TIA), and cerebral infarction without residual deficits: Secondary | ICD-10-CM | POA: Diagnosis not present

## 2016-08-20 DIAGNOSIS — Z96651 Presence of right artificial knee joint: Secondary | ICD-10-CM | POA: Diagnosis not present

## 2016-08-20 DIAGNOSIS — Z8673 Personal history of transient ischemic attack (TIA), and cerebral infarction without residual deficits: Secondary | ICD-10-CM | POA: Diagnosis not present

## 2016-08-20 DIAGNOSIS — R413 Other amnesia: Secondary | ICD-10-CM | POA: Diagnosis not present

## 2016-08-20 DIAGNOSIS — Z87891 Personal history of nicotine dependence: Secondary | ICD-10-CM | POA: Diagnosis not present

## 2016-08-20 DIAGNOSIS — Z79891 Long term (current) use of opiate analgesic: Secondary | ICD-10-CM | POA: Diagnosis not present

## 2016-08-20 DIAGNOSIS — Z471 Aftercare following joint replacement surgery: Secondary | ICD-10-CM | POA: Diagnosis not present

## 2016-08-20 DIAGNOSIS — F329 Major depressive disorder, single episode, unspecified: Secondary | ICD-10-CM | POA: Diagnosis not present

## 2016-08-20 DIAGNOSIS — Z791 Long term (current) use of non-steroidal anti-inflammatories (NSAID): Secondary | ICD-10-CM | POA: Diagnosis not present

## 2016-08-20 DIAGNOSIS — G4730 Sleep apnea, unspecified: Secondary | ICD-10-CM | POA: Diagnosis not present

## 2016-08-20 DIAGNOSIS — Z7901 Long term (current) use of anticoagulants: Secondary | ICD-10-CM | POA: Diagnosis not present

## 2016-08-20 DIAGNOSIS — M545 Low back pain: Secondary | ICD-10-CM | POA: Diagnosis not present

## 2016-08-21 ENCOUNTER — Telehealth (INDEPENDENT_AMBULATORY_CARE_PROVIDER_SITE_OTHER): Payer: Self-pay | Admitting: *Deleted

## 2016-08-21 NOTE — Telephone Encounter (Signed)
I called pt and sw pt and offered appt with erin tomorrow multiple times open both morning and afternoon. Pt states that she will have to check with transportation and will call back to sch.

## 2016-08-21 NOTE — Telephone Encounter (Signed)
Pt called left vm on triage stating she recently had R TKR on 08/15/16 and everything went well until the next day she had calf pain went and had a dopplar done and was negative, states is still having calf pain, on xarelto and has been suing ice, wants to know if there is anything else that needs to be done. I was going to try to bring her in a a sooner appt but dont see any thing this week, she does have a follow up on Monday. Please advise. I called no answer.   [phone]

## 2016-08-22 ENCOUNTER — Telehealth: Payer: Self-pay | Admitting: Neurology

## 2016-08-22 ENCOUNTER — Encounter (INDEPENDENT_AMBULATORY_CARE_PROVIDER_SITE_OTHER): Payer: Self-pay | Admitting: Family

## 2016-08-22 ENCOUNTER — Ambulatory Visit (INDEPENDENT_AMBULATORY_CARE_PROVIDER_SITE_OTHER): Payer: PPO | Admitting: Orthopedic Surgery

## 2016-08-22 VITALS — Ht 65.0 in | Wt 209.0 lb

## 2016-08-22 DIAGNOSIS — Z96651 Presence of right artificial knee joint: Secondary | ICD-10-CM

## 2016-08-22 LAB — RPR: RPR Ser Ql: NONREACTIVE

## 2016-08-22 LAB — LUPUS ANTICOAGULANT
Dilute Viper Venom Time: 31 s (ref 0.0–47.0)
PTT Lupus Anticoagulant: 35.4 s (ref 0.0–51.9)
Thrombin Time: 18 s (ref 0.0–23.0)
dPT Confirm Ratio: 0.94 Ratio (ref 0.00–1.40)
dPT: 37.4 s (ref 0.0–55.0)

## 2016-08-22 LAB — MTHFR DNA ANALYSIS

## 2016-08-22 LAB — HIV ANTIBODY (ROUTINE TESTING W REFLEX): HIV Screen 4th Generation wRfx: NONREACTIVE

## 2016-08-22 LAB — FACTOR 5 LEIDEN

## 2016-08-22 LAB — VITAMIN B12: Vitamin B-12: 1582 pg/mL — ABNORMAL HIGH (ref 232–1245)

## 2016-08-22 MED ORDER — CEREFOLIN 6-1-50-5 MG PO TABS: 1.0000 | ORAL_TABLET | Freq: Every day | ORAL | 3 refills | Status: DC

## 2016-08-22 NOTE — Telephone Encounter (Signed)
I called the patient. 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 We may go ahead and treat with cerefolin given the fact that she already has a history of DVTs.  I will call in a prescription for this. The patient has just had a knee replacement.

## 2016-08-22 NOTE — Telephone Encounter (Signed)
I called patient. The cerefolin is too expensive. The patient is already on B12 supplementation, she takes folic acid.  I will check a homocystine level when I see her again. She is being anticoagulated.

## 2016-08-22 NOTE — Telephone Encounter (Signed)
Pt would like to know if the results that Dr Jannifer Franklin called to share with her could be emailed to her @ [email or web address].  Pt states she is at her Dr's office now

## 2016-08-22 NOTE — Addendum Note (Signed)
Addended by: [name] on: 08/22/2016 05:35 PM   Modules accepted: Orders

## 2016-08-22 NOTE — Progress Notes (Signed)
Post-Op Visit Note   Patient: Debbie Pineda           Date of Birth: 05/06/1960           MRN: [id number] Visit Date: 08/22/2016 PCP: Jake Samples, PA-C  Chief Complaint:  Chief Complaint  Patient presents with  . Right Knee - Routine Post Op, Edema    08/15/16 right total knee. Pt had [place] to r/o DVT 08/16/16 due to calf swelling. Exam was negative. xarelto 20 mg qd    HPI:  Patient is a 56 year old woman who presents today one week status post right total knee arthroplasty. She has been doing well with her physical therapy. The physical therapist requests to use K tape. She is complaining of some right calf swelling and tenderness did have a Doppler on July 26 which was negative for clot. Takes Xarelto 20 mg daily.     Ortho Exam Incision well approximated and healing well with staples there is moderate swelling to the knee no erythema no drainage no gaping has full extension and flexion to 106. Medial calf tenderness there is no palpable cords or vein. There is no erythema no induration. Swelling with ecchymosis to the lower extremity.  Visit Diagnoses:  1. History of total knee arthroplasty, right     Plan: ok to use K tape. Continue aggressive physical therapy. Recommended compression stockings for the lower extremity swelling on the right. Follow-up in office as needed.  Follow-Up Instructions: Return in about 2 weeks (around 09/05/2016).   Imaging: No results found.  Orders:  No orders of the defined types were placed in this encounter.  No orders of the defined types were placed in this encounter.    PMFS History: Patient Active Problem List   Diagnosis Date Noted  . History of total knee arthroplasty, right 08/15/2016  . Presence of retained hardware   . Memory disorder 08/14/2016  . Cerebrovascular disease 08/14/2016  . Unilateral primary osteoarthritis, right knee 05/29/2016  . Primary osteoarthritis of both feet 05/29/2016  . Trochanteric bursitis  of both hips 05/25/2016  . Neck pain 05/25/2016  . Urinary urgency 04/19/2016  . Chronic low back pain 01/11/2016  . Depression 11/29/2015  . 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 Heart palpitations 12/14/2013  . Generalized osteoarthritis of multiple sites 11/04/2013  . Cerebral infarction ([place]) 10/30/2011  . PFO (patent foramen ovale) 10/30/2011  . Bradycardia 10/30/2011  . Chest pain 10/30/2011  . Hypothyroid   . Thrombophlebitis   . Sleep apnea   . Fibromyalgia   . 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 Status post bariatric surgery 12/07/2010  . Vein disorder 11/29/2010  . S/P total hysterectomy and bilateral salpingo-oophorectomy 11/29/2010  . S/P exploratory laparotomy 11/29/2010  . S/P cholecystectomy 11/29/2010  . S/P ACL surgery 11/29/2010  . Morbid obesity ([place]) 11/10/2010   Past Medical History:  Diagnosis Date  . ADD (attention deficit disorder)    takes Adderall daily  . Arthritis    "all over"  . Cerebral infarction ([place]) 10/30/2011  . Cerebrovascular disease 08/14/2016  . Chronic back pain    "all over"  . Chronic low back pain 01/11/2016  . Complication of anesthesia    tends to have hypotension when NPO and post-anesthesia  . Constipation    takes stool softener daily  . Degenerative disk disease   . Degenerative joint disease   . Depression    takes Cymbalta daily for pain per pt  . DVT (  deep venous thrombosis) (HCC)    RLE  . Family history of adverse reaction to anesthesia    a family member woke up during surgery; "think it was my mom"  . Fibromyalgia   . Generalized osteoarthritis of multiple sites 11/04/2013  . GERD (gastroesophageal reflux disease)   . Heart palpitations 12/14/2013  . History of blood clots    superficial  . Hyperglycemia   . Hypothyroid    takes Synthroid daily  . Incomplete emptying of bladder   . Insomnia    takes Trazodone nightly  . Iron deficiency anemia    takes Ferrous Sulfate daily  .  Joint pain   . Joint swelling    knees and ankles  . Memory disorder 08/14/2016  . Morbid obesity ([place])   . Nausea    takes Zofran as needed.Seeing GI doc  . Neck pain 05/25/2016  . OSA on CPAP    tested more than 5 yrs ago.    . Osteoarthritis   . PFO (patent foramen ovale)   . Primary osteoarthritis of both feet 05/29/2016   Right bunionectomy August 2017 by Dr. Sharol Given  . Scoliosis   . Sleep apnea   . Stroke [facility]) "several"   right foot weakness; memory issues, black spot right visual field since" (03/23/2015)  . Thrombophlebitis   . Trochanteric bursitis of both hips 05/25/2016  . Unilateral primary osteoarthritis, right knee 05/29/2016  . Urinary urgency 04/19/2016  . Vein disorder 11/29/2010    Family History  Problem Relation Age of Onset  . Heart disease Father   . Cancer Father   . Cancer Mother   . Heart disease Brother   . Cancer Brother   . Heart disease Sister   . Cancer Maternal Grandfather   . Colon cancer Neg Hx     Past Surgical History:  Procedure Laterality Date  . BUNIONECTOMY Right 08/2015  . CARDIAC CATHETERIZATION     2008.  "it was fine" (not sure why she had it done, and doesn't know where)  . COLONOSCOPY N/A 03/25/2013   Procedure: COLONOSCOPY;  Surgeon: Rogene Houston, MD;  [facility];  Service: Endoscopy;  Laterality: N/A;  930  . ESOPHAGOGASTRODUODENOSCOPY    . EXPLORATORY LAPAROTOMY     "took fallopian tubes out"  . JOINT REPLACEMENT    . KNEE ARTHROSCOPY Left   . KNEE ARTHROSCOPY W/ ACL RECONSTRUCTION Right    "added pins"  . LAPAROSCOPIC CHOLECYSTECTOMY  ~ 2001  . 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 TOTAL KNEE ARTHROPLASTY Left 03/23/2015   Procedure: TOTAL KNEE ARTHROPLASTY;  Surgeon: Newt Minion, MD;  Location: [place];  Service: Orthopedics;  Laterality: Left;  . TOTAL KNEE ARTHROPLASTY Right 08/15/2016   Procedure: RIGHT TOTAL KNEE ARTHROPLASTY, REMOVAL ACL SCREWS;  Surgeon: Newt Minion, MD;  Location: [place];  Service:  Orthopedics;  Laterality: Right;  . TOTAL KNEE ARTHROPLASTY WITH HARDWARE REMOVAL Right   . VAGINAL HYSTERECTOMY    . VARICOSE VEIN SURGERY Right X 2   Social History   Occupational History  . Disability   . formerly Therapist, sports, [place] History Main Topics  . Smoking status: Former Smoker    Packs/day: 0.75    Years: 8.00    Types: Cigarettes    Quit date: 12/01/1990  . Smokeless tobacco: Never Used     Comment: quit smoking in the 1990s  . Alcohol use No     Comment: 03/23/2015 "stopped drinking  in 2012 w/gastric bypass; drank socially before bypass"  . Drug use: No  . Sexual activity: Not Currently    Birth control/ protection: Surgical

## 2016-08-22 NOTE — Telephone Encounter (Addendum)
Called and LVM for patient letting her know we cannot email anything via regular email because her health information would not be protected via this (HIPAA law) She has option to get results via mychart, which it looks like she is already signed up for this. Or she can stop by the office to get a copy. Gave [facility] phone number if she has any further questions or concerns.

## 2016-08-22 NOTE — Telephone Encounter (Signed)
Pt calling to inform the new prescription  Called in for the 90 day supply is $500.00, pt is unable to afford that.  Pt would like to know If she can stay on the Phosphatidylserine-DHA-EPA (VAYACOG) 100-19.5-6.5 MG CAPS or if something less expensive can be called in for her.  If something else is called in she would like it sent to  [facility] [place] (Phone) [phone] (Fax)   Please call pt

## 2016-08-22 NOTE — Telephone Encounter (Signed)
Patient called office back and spoke with phone staff. She verified she received my message. She also was advised rx was sent to [facility] on file. She verbalized understanding.

## 2016-08-24 DIAGNOSIS — Z7901 Long term (current) use of anticoagulants: Secondary | ICD-10-CM | POA: Diagnosis not present

## 2016-08-24 DIAGNOSIS — Z87891 Personal history of nicotine dependence: Secondary | ICD-10-CM | POA: Diagnosis not present

## 2016-08-24 DIAGNOSIS — Z79891 Long term (current) use of opiate analgesic: Secondary | ICD-10-CM | POA: Diagnosis not present

## 2016-08-24 DIAGNOSIS — M545 Low back pain: Secondary | ICD-10-CM | POA: Diagnosis not present

## 2016-08-24 DIAGNOSIS — G4730 Sleep apnea, unspecified: Secondary | ICD-10-CM | POA: Diagnosis not present

## 2016-08-24 DIAGNOSIS — F329 Major depressive disorder, single episode, unspecified: Secondary | ICD-10-CM | POA: Diagnosis not present

## 2016-08-24 DIAGNOSIS — G4733 Obstructive sleep apnea (adult) (pediatric): Secondary | ICD-10-CM | POA: Diagnosis not present

## 2016-08-24 DIAGNOSIS — Z8673 Personal history of transient ischemic attack (TIA), and cerebral infarction without residual deficits: Secondary | ICD-10-CM | POA: Diagnosis not present

## 2016-08-24 DIAGNOSIS — Z791 Long term (current) use of non-steroidal anti-inflammatories (NSAID): Secondary | ICD-10-CM | POA: Diagnosis not present

## 2016-08-24 DIAGNOSIS — Z96651 Presence of right artificial knee joint: Secondary | ICD-10-CM | POA: Diagnosis not present

## 2016-08-24 DIAGNOSIS — Z471 Aftercare following joint replacement surgery: Secondary | ICD-10-CM | POA: Diagnosis not present

## 2016-08-24 DIAGNOSIS — R413 Other amnesia: Secondary | ICD-10-CM | POA: Diagnosis not present

## 2016-08-27 ENCOUNTER — Ambulatory Visit (INDEPENDENT_AMBULATORY_CARE_PROVIDER_SITE_OTHER): Payer: PPO | Admitting: Orthopedic Surgery

## 2016-08-27 ENCOUNTER — Encounter (INDEPENDENT_AMBULATORY_CARE_PROVIDER_SITE_OTHER): Payer: Self-pay | Admitting: Orthopedic Surgery

## 2016-08-27 ENCOUNTER — Ambulatory Visit (INDEPENDENT_AMBULATORY_CARE_PROVIDER_SITE_OTHER): Payer: PPO

## 2016-08-27 VITALS — Ht 65.0 in | Wt 209.0 lb

## 2016-08-27 DIAGNOSIS — G8929 Other chronic pain: Secondary | ICD-10-CM

## 2016-08-27 DIAGNOSIS — M25561 Pain in right knee: Secondary | ICD-10-CM

## 2016-08-27 DIAGNOSIS — Z96651 Presence of right artificial knee joint: Secondary | ICD-10-CM

## 2016-08-27 MED ORDER — CEREFOLIN 6-1-50-5 MG PO TABS: 1.0000 | ORAL_TABLET | Freq: Every day | ORAL | 1 refills | Status: DC

## 2016-08-27 NOTE — Telephone Encounter (Signed)
Pt calling back to inform that she has found a generic for the Cerefolin.  At the [facility] in [place] (on [address])the name of it Meta-Folbig(it would be around 38.00 a month), pt would like a call back to know if Dr Jannifer Franklin would consider her taking this. Pt would like to know if he did approved her for the Meta-Folbig would he still want her to continue her Vayacog.  Please call

## 2016-08-27 NOTE — Progress Notes (Signed)
Office Visit Note   Patient: Debbie Pineda           Date of Birth: 06-Oct-1960           MRN: [id number] Visit Date: 08/27/2016              Requested by: Jake Samples, PA-C [address] [place], [place] [address] PCP: Jake Samples, PA-C  Chief Complaint  Patient presents with  . Right Knee - Routine Post Op    08/15/16 right total knee replacement       HPI: Patient presents 2 weeks status post right total knee arthroplasty. She states she had acute onset of pain when she was lifting her knee in the bed when she had the patella clunk.  Assessment & Plan: Visit Diagnoses:  1. Chronic pain of right knee   2. History of total knee arthroplasty, right     Plan: Patient will complete her home health physical therapy after 2 more visits. Patient has excellent range of motion she would like to complete her therapy on her own and do water therapy I feel this is appropriate she has done well with rehabbing her left total knee. Patient may drive if she feels safe stopping the car.  Follow-Up Instructions: Return in about 3 weeks (around 09/17/2016).   Ortho Exam  Patient is alert, oriented, no adenopathy, well-dressed, normal affect, normal respiratory effort. Examination patient has full passive extension and flexion to 100. Varus and valgus stress is stable. She has full active extension with no extensor lag the patella is midline she does have increased mobility of the patella due to quad atrophy. There is no redness no cellulitis no signs of infection the incision is well-healed. Patient's maximum temperature was 99.  Imaging: Xr Knee 1-2 Views Right  Result Date: 08/27/2016 Two-view radiographs the right knee show stable alignment: Knee arthroplasty her patella is midline.   Labs: Lab Results  Component Value Date   HGBA1C 5.4 10/31/2011    Orders:  Orders Placed This Encounter  Procedures  . XR Knee 1-2 Views Right   No orders of the defined  types were placed in this encounter.    Procedures: No procedures performed  Clinical Data: No additional findings.  ROS:  All other systems negative, except as noted in the HPI. Review of Systems  Objective: Vital Signs: Ht 5\' 5"  (1.651 m)   Wt 209 lb (94.8 kg)   BMI 34.78 kg/m   Specialty Comments:  No specialty comments available.  PMFS History: Patient Active Problem List   Diagnosis Date Noted  . History of total knee arthroplasty, right 08/15/2016  . Presence of retained hardware   . Memory disorder 08/14/2016  . Cerebrovascular disease 08/14/2016  . Unilateral primary osteoarthritis, right knee 05/29/2016  . Primary osteoarthritis of both feet 05/29/2016  . Trochanteric bursitis of both hips 05/25/2016  . Neck pain 05/25/2016  . Urinary urgency 04/19/2016  . Chronic low back pain 01/11/2016  . Depression 11/29/2015  . 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 Heart palpitations 12/14/2013  . Generalized osteoarthritis of multiple sites 11/04/2013  . Cerebral infarction ([place]) 10/30/2011  . PFO (patent foramen ovale) 10/30/2011  . Bradycardia 10/30/2011  . Chest pain 10/30/2011  . Hypothyroid   . Thrombophlebitis   . Sleep apnea   . Fibromyalgia   . 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 Status post bariatric surgery 12/07/2010  . Vein disorder 11/29/2010  . S/P total hysterectomy  and bilateral salpingo-oophorectomy 11/29/2010  . S/P exploratory laparotomy 11/29/2010  . S/P cholecystectomy 11/29/2010  . S/P ACL surgery 11/29/2010  . Morbid obesity ([place]) 11/10/2010   Past Medical History:  Diagnosis Date  . ADD (attention deficit disorder)    takes Adderall daily  . Arthritis    "all over"  . Cerebral infarction ([place]) 10/30/2011  . Cerebrovascular disease 08/14/2016  . Chronic back pain    "all over"  . Chronic low back pain 01/11/2016  . Complication of anesthesia    tends to have hypotension when NPO and post-anesthesia  .  Constipation    takes stool softener daily  . Degenerative disk disease   . Degenerative joint disease   . Depression    takes Cymbalta daily for pain per pt  . DVT (deep venous thrombosis) (HCC)    RLE  . Family history of adverse reaction to anesthesia    a family member woke up during surgery; "think it was my mom"  . Fibromyalgia   . Generalized osteoarthritis of multiple sites 11/04/2013  . GERD (gastroesophageal reflux disease)   . Heart palpitations 12/14/2013  . History of blood clots    superficial  . Hyperglycemia   . Hypothyroid    takes Synthroid daily  . Incomplete emptying of bladder   . Insomnia    takes Trazodone nightly  . Iron deficiency anemia    takes Ferrous Sulfate daily  . Joint pain   . Joint swelling    knees and ankles  . Memory disorder 08/14/2016  . Morbid obesity ([place])   . Nausea    takes Zofran as needed.Seeing GI doc  . Neck pain 05/25/2016  . OSA on CPAP    tested more than 5 yrs ago.    . Osteoarthritis   . PFO (patent foramen ovale)   . Primary osteoarthritis of both feet 05/29/2016   Right bunionectomy August 2017 by Dr. Sharol Given  . Scoliosis   . Sleep apnea   . Stroke [facility]) "several"   right foot weakness; memory issues, black spot right visual field since" (03/23/2015)  . Thrombophlebitis   . Trochanteric bursitis of both hips 05/25/2016  . Unilateral primary osteoarthritis, right knee 05/29/2016  . Urinary urgency 04/19/2016  . Vein disorder 11/29/2010    Family History  Problem Relation Age of Onset  . Heart disease Father   . Cancer Father   . Cancer Mother   . Heart disease Brother   . Cancer Brother   . Heart disease Sister   . Cancer Maternal Grandfather   . Colon cancer Neg Hx     Past Surgical History:  Procedure Laterality Date  . BUNIONECTOMY Right 08/2015  . CARDIAC CATHETERIZATION     2008.  "it was fine" (not sure why she had it done, and doesn't know where)  . COLONOSCOPY N/A 03/25/2013   Procedure: COLONOSCOPY;   Surgeon: Rogene Houston, MD;  [facility];  Service: Endoscopy;  Laterality: N/A;  930  . ESOPHAGOGASTRODUODENOSCOPY    . EXPLORATORY LAPAROTOMY     "took fallopian tubes out"  . JOINT REPLACEMENT    . KNEE ARTHROSCOPY Left   . KNEE ARTHROSCOPY W/ ACL RECONSTRUCTION Right    "added pins"  . LAPAROSCOPIC CHOLECYSTECTOMY  ~ 2001  . 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 TOTAL KNEE ARTHROPLASTY Left 03/23/2015   Procedure: TOTAL KNEE ARTHROPLASTY;  Surgeon: Newt Minion, MD;  Location: [place];  Service: Orthopedics;  Laterality: Left;  .  TOTAL KNEE ARTHROPLASTY Right 08/15/2016   Procedure: RIGHT TOTAL KNEE ARTHROPLASTY, REMOVAL ACL SCREWS;  Surgeon: Newt Minion, MD;  Location: [place];  Service: Orthopedics;  Laterality: Right;  . TOTAL KNEE ARTHROPLASTY WITH HARDWARE REMOVAL Right   . VAGINAL HYSTERECTOMY    . VARICOSE VEIN SURGERY Right X 2   Social History   Occupational History  . Disability   . formerly Therapist, sports, [place] History Main Topics  . Smoking status: Former Smoker    Packs/day: 0.75    Years: 8.00    Types: Cigarettes    Quit date: 12/01/1990  . Smokeless tobacco: Never Used     Comment: quit smoking in the 1990s  . Alcohol use No     Comment: 03/23/2015 "stopped drinking in 2012 w/gastric bypass; drank socially before bypass"  . Drug use: No  . Sexual activity: Not Currently    Birth control/ protection: Surgical

## 2016-08-27 NOTE — Addendum Note (Signed)
Addended by: [name] on: 08/27/2016 05:44 PM   Modules accepted: Orders

## 2016-08-27 NOTE — Telephone Encounter (Signed)
I called the patient. She found the Cerefolin tablet as a generic, I will call this into the [place]. She will stop the [facility] for now.

## 2016-08-31 ENCOUNTER — Ambulatory Visit (INDEPENDENT_AMBULATORY_CARE_PROVIDER_SITE_OTHER): Payer: PPO | Admitting: Family

## 2016-08-31 ENCOUNTER — Encounter (INDEPENDENT_AMBULATORY_CARE_PROVIDER_SITE_OTHER): Payer: Self-pay | Admitting: Family

## 2016-08-31 VITALS — Ht 65.0 in | Wt 209.0 lb

## 2016-08-31 DIAGNOSIS — Z96651 Presence of right artificial knee joint: Secondary | ICD-10-CM

## 2016-09-06 NOTE — Progress Notes (Signed)
Post-Op Visit Note   Patient: Debbie Pineda           Date of Birth: 01-Mar-1960           MRN: [id number] Visit Date: 08/31/2016 PCP: Jake Samples, PA-C  Chief Complaint:  Chief Complaint  Patient presents with  . Right Knee - Pain    08/15/16 right total knee replacement. Office visit 08/30/16 routine check and d/c staples. Here today with increased pain     HPI:  The patient is 56 year old woman who presents today as a work in she is complaining of increased pain in her right total knee arthroplasty. She was last seen 4 days ago her sutures were discontinue that day. She has been ambulating with a cane and states that she is feeling some popping sensations in her knee. She had a total knee arthroplasty on the left feels that the right has been much more painful. Feels that her pain is stopping her from therapy.    Ortho Exam On examination the incision is well approximated healing well there is no gaping noted drainage no surrounding erythema. Has moderate swelling to she is globally tender has some quadriceps tenderness and pain. has full extension and flexion 200.  Visit Diagnoses:  1. History of total knee arthroplasty, right     Plan: Reassurance provided. Did encourage her to continue her physical therapy. We'll follow-up in office as scheduled   Follow-Up Instructions: Return for as scheduled.   Imaging: No results found.  Orders:  No orders of the defined types were placed in this encounter.  No orders of the defined types were placed in this encounter.    PMFS History: Patient Active Problem List   Diagnosis Date Noted  . History of total knee arthroplasty, right 08/15/2016  . Presence of retained hardware   . Memory disorder 08/14/2016  . Cerebrovascular disease 08/14/2016  . Unilateral primary osteoarthritis, right knee 05/29/2016  . Primary osteoarthritis of both feet 05/29/2016  . Trochanteric bursitis of both hips 05/25/2016  . Neck pain  05/25/2016  . Urinary urgency 04/19/2016  . Chronic low back pain 01/11/2016  . Depression 11/29/2015  . 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 Heart palpitations 12/14/2013  . Generalized osteoarthritis of multiple sites 11/04/2013  . Cerebral infarction ([place]) 10/30/2011  . PFO (patent foramen ovale) 10/30/2011  . Bradycardia 10/30/2011  . Chest pain 10/30/2011  . Hypothyroid   . Thrombophlebitis   . Sleep apnea   . Fibromyalgia   . 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 Status post bariatric surgery 12/07/2010  . Vein disorder 11/29/2010  . S/P total hysterectomy and bilateral salpingo-oophorectomy 11/29/2010  . S/P exploratory laparotomy 11/29/2010  . S/P cholecystectomy 11/29/2010  . S/P ACL surgery 11/29/2010  . Morbid obesity ([place]) 11/10/2010   Past Medical History:  Diagnosis Date  . ADD (attention deficit disorder)    takes Adderall daily  . Arthritis    "all over"  . Cerebral infarction ([place]) 10/30/2011  . Cerebrovascular disease 08/14/2016  . Chronic back pain    "all over"  . Chronic low back pain 01/11/2016  . Complication of anesthesia    tends to have hypotension when NPO and post-anesthesia  . Constipation    takes stool softener daily  . Degenerative disk disease   . Degenerative joint disease   . Depression    takes Cymbalta daily for pain per pt  . DVT (deep venous thrombosis) (HCC)    RLE  .  Family history of adverse reaction to anesthesia    a family member woke up during surgery; "think it was my mom"  . Fibromyalgia   . Generalized osteoarthritis of multiple sites 11/04/2013  . GERD (gastroesophageal reflux disease)   . Heart palpitations 12/14/2013  . History of blood clots    superficial  . Hyperglycemia   . Hypothyroid    takes Synthroid daily  . Incomplete emptying of bladder   . Insomnia    takes Trazodone nightly  . Iron deficiency anemia    takes Ferrous Sulfate daily  . Joint pain   . Joint swelling    knees  and ankles  . Memory disorder 08/14/2016  . Morbid obesity ([place])   . Nausea    takes Zofran as needed.Seeing GI doc  . Neck pain 05/25/2016  . OSA on CPAP    tested more than 5 yrs ago.    . Osteoarthritis   . PFO (patent foramen ovale)   . Primary osteoarthritis of both feet 05/29/2016   Right bunionectomy August 2017 by Dr. Sharol Given  . Scoliosis   . Sleep apnea   . Stroke [facility]) "several"   right foot weakness; memory issues, black spot right visual field since" (03/23/2015)  . Thrombophlebitis   . Trochanteric bursitis of both hips 05/25/2016  . Unilateral primary osteoarthritis, right knee 05/29/2016  . Urinary urgency 04/19/2016  . Vein disorder 11/29/2010    Family History  Problem Relation Age of Onset  . Heart disease Father   . Cancer Father   . Cancer Mother   . Heart disease Brother   . Cancer Brother   . Heart disease Sister   . Cancer Maternal Grandfather   . Colon cancer Neg Hx     Past Surgical History:  Procedure Laterality Date  . BUNIONECTOMY Right 08/2015  . CARDIAC CATHETERIZATION     2008.  "it was fine" (not sure why she had it done, and doesn't know where)  . COLONOSCOPY N/A 03/25/2013   Procedure: COLONOSCOPY;  Surgeon: Rogene Houston, MD;  [facility];  Service: Endoscopy;  Laterality: N/A;  930  . ESOPHAGOGASTRODUODENOSCOPY    . EXPLORATORY LAPAROTOMY     "took fallopian tubes out"  . JOINT REPLACEMENT    . KNEE ARTHROSCOPY Left   . KNEE ARTHROSCOPY W/ ACL RECONSTRUCTION Right    "added pins"  . LAPAROSCOPIC CHOLECYSTECTOMY  ~ 2001  . 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 TOTAL KNEE ARTHROPLASTY Left 03/23/2015   Procedure: TOTAL KNEE ARTHROPLASTY;  Surgeon: Newt Minion, MD;  [place];  Service: Orthopedics;  Laterality: Left;  . TOTAL KNEE ARTHROPLASTY Right 08/15/2016   Procedure: RIGHT TOTAL KNEE ARTHROPLASTY, REMOVAL ACL SCREWS;  Surgeon: Newt Minion, MD;  Location: [place];  Service: Orthopedics;  Laterality: Right;  . TOTAL KNEE  ARTHROPLASTY WITH HARDWARE REMOVAL Right   . VAGINAL HYSTERECTOMY    . VARICOSE VEIN SURGERY Right X 2   Social History   Occupational History  . Disability   . formerly Therapist, sports, [place] History Main Topics  . Smoking status: Former Smoker    Packs/day: 0.75    Years: 8.00    Types: Cigarettes    Quit date: 12/01/1990  . Smokeless tobacco: Never Used     Comment: quit smoking in the 1990s  . Alcohol use No     Comment: 03/23/2015 "stopped drinking in 2012 w/gastric bypass; drank socially before bypass"  .  Drug use: No  . Sexual activity: Not Currently    Birth control/ protection: Surgical

## 2016-09-17 ENCOUNTER — Encounter (INDEPENDENT_AMBULATORY_CARE_PROVIDER_SITE_OTHER): Payer: Self-pay | Admitting: Orthopedic Surgery

## 2016-09-17 ENCOUNTER — Telehealth (HOSPITAL_COMMUNITY): Payer: Self-pay | Admitting: Physical Therapy

## 2016-09-17 ENCOUNTER — Ambulatory Visit (INDEPENDENT_AMBULATORY_CARE_PROVIDER_SITE_OTHER): Payer: PPO | Admitting: Orthopedic Surgery

## 2016-09-17 DIAGNOSIS — Z96651 Presence of right artificial knee joint: Secondary | ICD-10-CM

## 2016-09-17 NOTE — Progress Notes (Signed)
Office Visit Note   Patient: Debbie Pineda           Date of Birth: 09/20/1960           MRN: [id number] Visit Date: 09/17/2016              Requested by: Jake Samples, PA-C [address] [place],  [address] PCP: Jake Samples, PA-C  Chief Complaint  Patient presents with  . Right Knee - Routine Post Op      HPI: Patient is 4 weeks status post right total knee arthroplasty. She complains of global pain around the thigh and calf. States that this is relieved by changing position.  Assessment & Plan: Visit Diagnoses:  1. Presence of right artificial knee joint     Plan: Patient has significant amount of quad atrophy and atonia she lacks about 5 to full extension. She is given a prescription for [facility] outpatient physical therapy for strengthening of the right knee.  Follow-Up Instructions: Return in about 4 weeks (around 10/15/2016).   Ortho Exam  Patient is alert, oriented, no adenopathy, well-dressed, normal affect, normal respiratory effort. Examination patient use a cane for ambulation. She lacks 5 to full extension her patella tracks midline collateral ligaments are stable. Patient has weakness in her knee. Patient has no pain with dorsiflexion of the foot the calf is soft in the thigh is soft and both are nontender to palpation. There is no redness or signs of infection.  Imaging: No results found. No images are attached to the encounter.  Labs: Lab Results  Component Value Date   HGBA1C 5.4 10/31/2011    Orders:  No orders of the defined types were placed in this encounter.  No orders of the defined types were placed in this encounter.    Procedures: No procedures performed  Clinical Data: No additional findings.  ROS:  All other systems negative, except as noted in the HPI. Review of Systems  Objective: Vital Signs: There were no vitals taken for this visit.  Specialty Comments:  No specialty comments  available.  PMFS History: Patient Active Problem List   Diagnosis Date Noted  . Presence of right artificial knee joint 09/17/2016  . History of total knee arthroplasty, right 08/15/2016  . Presence of retained hardware   . Memory disorder 08/14/2016  . Cerebrovascular disease 08/14/2016  . Unilateral primary osteoarthritis, right knee 05/29/2016  . Primary osteoarthritis of both feet 05/29/2016  . Trochanteric bursitis of both hips 05/25/2016  . Neck pain 05/25/2016  . Urinary urgency 04/19/2016  . Chronic low back pain 01/11/2016  . Depression 11/29/2015  . 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 Heart palpitations 12/14/2013  . Generalized osteoarthritis of multiple sites 11/04/2013  . Cerebral infarction ([place]) 10/30/2011  . PFO (patent foramen ovale) 10/30/2011  . Bradycardia 10/30/2011  . Chest pain 10/30/2011  . Hypothyroid   . Thrombophlebitis   . Sleep apnea   . Fibromyalgia   . 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 Status post bariatric surgery 12/07/2010  . Vein disorder 11/29/2010  . S/P total hysterectomy and bilateral salpingo-oophorectomy 11/29/2010  . S/P exploratory laparotomy 11/29/2010  . S/P cholecystectomy 11/29/2010  . S/P ACL surgery 11/29/2010  . Morbid obesity ([place]) 11/10/2010   Past Medical History:  Diagnosis Date  . ADD (attention deficit disorder)    takes Adderall daily  . Arthritis    "all over"  . Cerebral infarction ([place]) 10/30/2011  . Cerebrovascular disease  08/14/2016  . Chronic back pain    "all over"  . Chronic low back pain 01/11/2016  . Complication of anesthesia    tends to have hypotension when NPO and post-anesthesia  . Constipation    takes stool softener daily  . Degenerative disk disease   . Degenerative joint disease   . Depression    takes Cymbalta daily for pain per pt  . DVT (deep venous thrombosis) (HCC)    RLE  . Family history of adverse reaction to anesthesia    a family member woke up  during surgery; "think it was my mom"  . Fibromyalgia   . Generalized osteoarthritis of multiple sites 11/04/2013  . GERD (gastroesophageal reflux disease)   . Heart palpitations 12/14/2013  . History of blood clots    superficial  . Hyperglycemia   . Hypothyroid    takes Synthroid daily  . Incomplete emptying of bladder   . Insomnia    takes Trazodone nightly  . Iron deficiency anemia    takes Ferrous Sulfate daily  . Joint pain   . Joint swelling    knees and ankles  . Memory disorder 08/14/2016  . Morbid obesity ([place])   . Nausea    takes Zofran as needed.Seeing GI doc  . Neck pain 05/25/2016  . OSA on CPAP    tested more than 5 yrs ago.    . Osteoarthritis   . PFO (patent foramen ovale)   . Primary osteoarthritis of both feet 05/29/2016   Right bunionectomy August 2017 by Dr. Sharol Given  . Scoliosis   . Sleep apnea   . Stroke [facility]) "several"   right foot weakness; memory issues, black spot right visual field since" (03/23/2015)  . Thrombophlebitis   . Trochanteric bursitis of both hips 05/25/2016  . Unilateral primary osteoarthritis, right knee 05/29/2016  . Urinary urgency 04/19/2016  . Vein disorder 11/29/2010    Family History  Problem Relation Age of Onset  . Heart disease Father   . Cancer Father   . Cancer Mother   . Heart disease Brother   . Cancer Brother   . Heart disease Sister   . Cancer Maternal Grandfather   . Colon cancer Neg Hx     Past Surgical History:  Procedure Laterality Date  . BUNIONECTOMY Right 08/2015  . CARDIAC CATHETERIZATION     2008.  "it was fine" (not sure why she had it done, and doesn't know where)  . COLONOSCOPY N/A 03/25/2013   Procedure: COLONOSCOPY;  Surgeon: Rogene Houston, MD;  [facility];  Service: Endoscopy;  Laterality: N/A;  930  . ESOPHAGOGASTRODUODENOSCOPY    . EXPLORATORY LAPAROTOMY     "took fallopian tubes out"  . JOINT REPLACEMENT    . KNEE ARTHROSCOPY Left   . KNEE ARTHROSCOPY W/ ACL RECONSTRUCTION Right     "added pins"  . LAPAROSCOPIC CHOLECYSTECTOMY  ~ 2001  . 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 TOTAL KNEE ARTHROPLASTY Left 03/23/2015   Procedure: TOTAL KNEE ARTHROPLASTY;  Surgeon: Newt Minion, MD;  Location: [facility];  Service: Orthopedics;  Laterality: Left;  . TOTAL KNEE ARTHROPLASTY Right 08/15/2016   Procedure: RIGHT TOTAL KNEE ARTHROPLASTY, REMOVAL ACL SCREWS;  Surgeon: Newt Minion, MD;  Location: [facility];  Service: Orthopedics;  Laterality: Right;  . TOTAL KNEE ARTHROPLASTY WITH HARDWARE REMOVAL Right   . VAGINAL HYSTERECTOMY    . VARICOSE VEIN SURGERY Right X 2   Social History   Occupational History  .  Disability   . formerly Therapist, sports, [place] History Main Topics  . Smoking status: Former Smoker    Packs/day: 0.75    Years: 8.00    Types: Cigarettes    Quit date: 12/01/1990  . Smokeless tobacco: Never Used     Comment: quit smoking in the 1990s  . Alcohol use No     Comment: 03/23/2015 "stopped drinking in 2012 w/gastric bypass; drank socially before bypass"  . Drug use: No  . Sexual activity: Not Currently    Birth control/ protection: Surgical

## 2016-09-17 NOTE — Telephone Encounter (Signed)
Called informed pt of benefits. NF 09/17/16 HTA 05/22/16- current Copay 15.00 per visit OOP 3400- 0 met visits based on medical necessity. Spoke to [place] Ref# [id number] NF 09/17/16 FOTO Referral in EPIC Pt called requested that she be called with benefits before DOS. NF 09/17/16

## 2016-09-19 ENCOUNTER — Telehealth: Payer: Self-pay | Admitting: Neurology

## 2016-09-19 NOTE — Telephone Encounter (Signed)
Pt made aware that Dr Jannifer Franklin is out of office this week but asked that this message be sent anyway.  Pt had right knee replacement surgery and since then she has been experiencing right calf pain and upper right knee is swollen. Pt said that because of her recent diagnosis she is concerned about clotting in her legs and would like to know what Dr Jannifer Franklin would suggest.  Pt has again asked that even though Dr Jannifer Franklin is out this week to still send message for him to call her on his return

## 2016-09-19 NOTE — Telephone Encounter (Signed)
Called patient back. Advised since CW,MD out of office, she should contact Dr Sharol Given who did her surgery. She stated she already did that . He evaluated her on Monday and she states he was not concerned. She stated sx seem to be worse. I recommended she call Dr Sharol Given office back to let him know sx worse. May have to order [place]. Other option, she can contact PCP, to see if they can evaluate her, order any further testing if warranted. She verbalized understanding and will call back with any further questions.

## 2016-09-20 ENCOUNTER — Ambulatory Visit (INDEPENDENT_AMBULATORY_CARE_PROVIDER_SITE_OTHER): Payer: PPO

## 2016-09-20 ENCOUNTER — Ambulatory Visit (INDEPENDENT_AMBULATORY_CARE_PROVIDER_SITE_OTHER): Payer: PPO | Admitting: Specialist

## 2016-09-20 ENCOUNTER — Encounter (INDEPENDENT_AMBULATORY_CARE_PROVIDER_SITE_OTHER): Payer: Self-pay | Admitting: Specialist

## 2016-09-20 ENCOUNTER — Telehealth (INDEPENDENT_AMBULATORY_CARE_PROVIDER_SITE_OTHER): Payer: Self-pay | Admitting: Orthopedic Surgery

## 2016-09-20 ENCOUNTER — Ambulatory Visit (HOSPITAL_COMMUNITY)
Admission: RE | Admit: 2016-09-20 | Discharge: 2016-09-20 | Disposition: A | Discharge status: Home / Self Care | Payer: PPO | Source: Ambulatory Visit | Attending: Vascular Surgery | Admitting: Vascular Surgery

## 2016-09-20 VITALS — BP 96/60 | HR 69 | Ht 65.0 in | Wt 215.0 lb

## 2016-09-20 DIAGNOSIS — M545 Low back pain, unspecified: Secondary | ICD-10-CM

## 2016-09-20 DIAGNOSIS — G8929 Other chronic pain: Secondary | ICD-10-CM

## 2016-09-20 DIAGNOSIS — M7989 Other specified soft tissue disorders: Secondary | ICD-10-CM

## 2016-09-20 DIAGNOSIS — M79661 Pain in right lower leg: Secondary | ICD-10-CM

## 2016-09-20 NOTE — Progress Notes (Signed)
Office Visit Note   Patient: Debbie Pineda           Date of Birth: 1960-02-28           MRN: [id number] Visit Date: 09/20/2016              Requested by: Jake Samples, PA-C [address] [place], [place] [address] PCP: Jake Samples, PA-C   Assessment & Plan: Visit Diagnoses:  1. Chronic bilateral low back pain without sciatica   2. Pain and swelling of right lower leg     Plan: Avoid frequent bending and stooping  No lifting greater than 10 lbs. May use ice or moist heat for pain. Weight loss is of benefit. Handicap license is approved. We are scheduling for right leg ultrasound to assess for a DVT. We are scheduling you for right L3-4, L4-5 and L5-S1 facet blocks Do not take arthritis medications while on xarelto.  Follow-Up Instructions: Return in about 3 years (around 09/21/2019).   Orders:  Orders Placed This Encounter  Procedures  . XR Lumbar Spine 2-3 Views   No orders of the defined types were placed in this encounter.     Procedures: No procedures performed   Clinical Data: No additional findings.   Subjective: Chief Complaint  Patient presents with  . Lower Back - Follow-up    HPI  Review of Systems   Objective: Vital Signs: BP 96/60 (BP Location: Left Arm, Patient Position: Sitting)   Pulse 69   Ht 5\' 5"  (1.651 m)   Wt 215 lb (97.5 kg)   BMI 35.78 kg/m   Physical Exam  Ortho Exam  Specialty Comments:  No specialty comments available.  Imaging: No results found.   PMFS History: Patient Active Problem List   Diagnosis Date Noted  . Presence of right artificial knee joint 09/17/2016  . History of total knee arthroplasty, right 08/15/2016  . Presence of retained hardware   . Memory disorder 08/14/2016  . Cerebrovascular disease 08/14/2016  . Unilateral primary osteoarthritis, right knee 05/29/2016  . Primary osteoarthritis of both feet 05/29/2016  . Trochanteric bursitis of both hips 05/25/2016  . Neck  pain 05/25/2016  . Urinary urgency 04/19/2016  . Chronic low back pain 01/11/2016  . Depression 11/29/2015  . 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 Heart palpitations 12/14/2013  . Generalized osteoarthritis of multiple sites 11/04/2013  . Cerebral infarction ([place]) 10/30/2011  . PFO (patent foramen ovale) 10/30/2011  . Bradycardia 10/30/2011  . Chest pain 10/30/2011  . Hypothyroid   . Thrombophlebitis   . Sleep apnea   . Fibromyalgia   . 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 Status post bariatric surgery 12/07/2010  . Vein disorder 11/29/2010  . S/P total hysterectomy and bilateral salpingo-oophorectomy 11/29/2010  . S/P exploratory laparotomy 11/29/2010  . S/P cholecystectomy 11/29/2010  . S/P ACL surgery 11/29/2010  . Morbid obesity ([place]) 11/10/2010   Past Medical History:  Diagnosis Date  . ADD (attention deficit disorder)    takes Adderall daily  . Arthritis    "all over"  . Cerebral infarction ([place]) 10/30/2011  . Cerebrovascular disease 08/14/2016  . Chronic back pain    "all over"  . Chronic low back pain 01/11/2016  . Complication of anesthesia    tends to have hypotension when NPO and post-anesthesia  . Constipation    takes stool softener daily  . Degenerative disk disease   . Degenerative joint disease   . Depression  takes Cymbalta daily for pain per pt  . DVT (deep venous thrombosis) (HCC)    RLE  . Family history of adverse reaction to anesthesia    a family member woke up during surgery; "think it was my mom"  . Fibromyalgia   . Generalized osteoarthritis of multiple sites 11/04/2013  . GERD (gastroesophageal reflux disease)   . Heart palpitations 12/14/2013  . History of blood clots    superficial  . Hyperglycemia   . Hypothyroid    takes Synthroid daily  . Incomplete emptying of bladder   . Insomnia    takes Trazodone nightly  . Iron deficiency anemia    takes Ferrous Sulfate daily  . Joint pain   . Joint swelling     knees and ankles  . Memory disorder 08/14/2016  . Morbid obesity ([place])   . Nausea    takes Zofran as needed.Seeing GI doc  . Neck pain 05/25/2016  . OSA on CPAP    tested more than 5 yrs ago.    . Osteoarthritis   . PFO (patent foramen ovale)   . Primary osteoarthritis of both feet 05/29/2016   Right bunionectomy August 2017 by Dr. Sharol Given  . Scoliosis   . Sleep apnea   . Stroke [facility]) "several"   right foot weakness; memory issues, black spot right visual field since" (03/23/2015)  . Thrombophlebitis   . Trochanteric bursitis of both hips 05/25/2016  . Unilateral primary osteoarthritis, right knee 05/29/2016  . Urinary urgency 04/19/2016  . Vein disorder 11/29/2010    Family History  Problem Relation Age of Onset  . Heart disease Father   . Cancer Father   . Cancer Mother   . Heart disease Brother   . Cancer Brother   . Heart disease Sister   . Cancer Maternal Grandfather   . Colon cancer Neg Hx     Past Surgical History:  Procedure Laterality Date  . BUNIONECTOMY Right 08/2015  . CARDIAC CATHETERIZATION     2008.  "it was fine" (not sure why she had it done, and doesn't know where)  . COLONOSCOPY N/A 03/25/2013   Procedure: COLONOSCOPY;  Surgeon: Rogene Houston, MD;  [facility];  Service: Endoscopy;  Laterality: N/A;  930  . ESOPHAGOGASTRODUODENOSCOPY    . EXPLORATORY LAPAROTOMY     "took fallopian tubes out"  . JOINT REPLACEMENT    . KNEE ARTHROSCOPY Left   . KNEE ARTHROSCOPY W/ ACL RECONSTRUCTION Right    "added pins"  . LAPAROSCOPIC CHOLECYSTECTOMY  ~ 2001  . 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 TOTAL KNEE ARTHROPLASTY Left 03/23/2015   Procedure: TOTAL KNEE ARTHROPLASTY;  Surgeon: Newt Minion, MD;  Location: [facility];  Service: Orthopedics;  Laterality: Left;  . TOTAL KNEE ARTHROPLASTY Right 08/15/2016   Procedure: RIGHT TOTAL KNEE ARTHROPLASTY, REMOVAL ACL SCREWS;  Surgeon: Newt Minion, MD;  Location: [facility];  Service: Orthopedics;  Laterality: Right;  . TOTAL  KNEE ARTHROPLASTY WITH HARDWARE REMOVAL Right   . VAGINAL HYSTERECTOMY    . VARICOSE VEIN SURGERY Right X 2   Social History   Occupational History  . Disability   . formerly Therapist, sports, [place] History Main Topics  . Smoking status: Former Smoker    Packs/day: 0.75    Years: 8.00    Types: Cigarettes    Quit date: 12/01/1990  . Smokeless tobacco: Never Used     Comment: quit smoking in the 1990s  . Alcohol  use No     Comment: 03/23/2015 "stopped drinking in 2012 w/gastric bypass; drank socially before bypass"  . Drug use: No  . Sexual activity: Not Currently    Birth control/ protection: Surgical

## 2016-09-20 NOTE — Progress Notes (Signed)
Office Visit Note   Patient: Debbie Pineda           Date of Birth: 1960-10-30           MRN: [id number] Visit Date: 09/20/2016              Requested by: Jake Samples, PA-C [address] [place], [place] [address] PCP: Jake Samples, PA-C   Assessment & Plan: Visit Diagnoses:  1. Chronic bilateral low back pain without sciatica   2. Pain and swelling of right lower leg     Plan: Avoid frequent bending and stooping  No lifting greater than 10 lbs. May use ice or moist heat for pain. Weight loss is of benefit. Handicap license is approved. We are scheduling for right leg ultrasound to assess for a DVT. We are scheduling you for right L3-4, L4-5 and L5-S1 facet blocks Do not take arthritis medications while on xarelto  Follow-Up Instructions: Return in about 3 years (around 09/21/2019).   Orders:  Orders Placed This Encounter  Procedures  . XR Lumbar Spine 2-3 Views  . Ambulatory referral to Physical Medicine Rehab   No orders of the defined types were placed in this encounter.     Procedures: No procedures performed   Clinical Data: No additional findings.   Subjective: Chief Complaint  Patient presents with  . Lower Back - Follow-up    56 year old female with history of collapsing degenerative scoliosis. She underwent a series of facet blocks followed by RFA in 3-06/2016 then underwent right TKR 08/14/2016. She had doppler u/s of the right leg post surgery and it was negative. 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 She is currently on Xarelto. Saw Dr. Sharol Given and is advised that ROM of the right TKR is doing well. Her back pain has been worsening since the Knee replacement and she is seen to day to evaluate and discuss options. The right calf and thigh are swollen and she is unable to wear a compression stocking on the right leg And thigh due  to localized discomfort when the stocking pushes on the leg and thigh.     Review of Systems  HENT: Negative.   Eyes: Negative.   Respiratory: Negative for chest tightness and shortness of breath.   Cardiovascular: Positive for leg swelling.  Gastrointestinal: Negative.  Negative for abdominal distention and abdominal pain.  Endocrine: Negative.  Negative for polydipsia, polyphagia and polyuria.  Genitourinary: Negative.  Negative for flank pain.  Musculoskeletal: Positive for back pain.  Skin: Positive for color change. Negative for pallor, rash and wound.  Allergic/Immunologic: Negative.   Neurological: Positive for weakness. Negative for dizziness, tremors, seizures, syncope, facial asymmetry, speech difficulty, light-headedness, numbness and headaches.  Hematological: Negative.  Negative for adenopathy. Does not bruise/bleed easily.  Psychiatric/Behavioral: Negative for agitation, behavioral problems, confusion, dysphoric mood, hallucinations, self-injury, sleep disturbance and suicidal ideas. The patient is nervous/anxious. The patient is not hyperactive.      Objective: Vital Signs: BP 96/60 (BP Location: Left Arm, Patient Position: Sitting)   Pulse 69   Ht 5\' 5"  (1.651 m)   Wt 215 lb (97.5 kg)   BMI 35.78 kg/m   Physical Exam  Constitutional: She is oriented to person, place, and time. She appears well-developed and well-nourished. No distress.  HENT:  Head: Normocephalic and atraumatic.  Eyes: Pupils are equal, round, and reactive to  light. EOM are normal. Right eye exhibits no discharge. Left eye exhibits no discharge.  Neck: Normal range of motion. Neck supple. No JVD present. No tracheal deviation present. No thyromegaly present.  Pulmonary/Chest: Effort normal and breath sounds normal. No stridor. No respiratory distress. She has no wheezes. She exhibits no tenderness.  Abdominal: Soft. Bowel sounds are normal. She exhibits no distension. There is no tenderness. There  is no guarding.  Neurological: She is alert and oriented to person, place, and time.  Skin: Skin is warm and dry. She is not diaphoretic.  Psychiatric: She has a normal mood and affect. Her behavior is normal. Judgment and thought content normal.    Back Exam   Tenderness  The patient is experiencing tenderness in the lumbar.  Range of Motion  Extension: abnormal  Flexion: normal  Lateral Bend Right: abnormal  Lateral Bend Left: abnormal  Rotation Right: abnormal  Rotation Left: abnormal   Muscle Strength  Right Quadriceps:  5/5  Left Quadriceps:  5/5  Right Hamstrings:  5/5  Left Hamstrings:  5/5   Tests  Straight leg raise right: negative Straight leg raise left: negative  Reflexes  Patellar: normal Achilles: normal Babinski's sign: normal   Comments:  Tender right paralumbar  L3-S1 right sided midline same areas.      Specialty Comments:  No specialty comments available.  Imaging: No results found.   PMFS History: Patient Active Problem List   Diagnosis Date Noted  . Presence of right artificial knee joint 09/17/2016  . History of total knee arthroplasty, right 08/15/2016  . Presence of retained hardware   . Memory disorder 08/14/2016  . Cerebrovascular disease 08/14/2016  . Unilateral primary osteoarthritis, right knee 05/29/2016  . Primary osteoarthritis of both feet 05/29/2016  . Trochanteric bursitis of both hips 05/25/2016  . Neck pain 05/25/2016  . Urinary urgency 04/19/2016  . Chronic low back pain 01/11/2016  . Depression 11/29/2015  . 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 Heart palpitations 12/14/2013  . Generalized osteoarthritis of multiple sites 11/04/2013  . Cerebral infarction ([place]) 10/30/2011  . PFO (patent foramen ovale) 10/30/2011  . Bradycardia 10/30/2011  . Chest pain 10/30/2011  . Hypothyroid   . Thrombophlebitis   . Sleep apnea   . Fibromyalgia   . 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 Status post  bariatric surgery 12/07/2010  . Vein disorder 11/29/2010  . S/P total hysterectomy and bilateral salpingo-oophorectomy 11/29/2010  . S/P exploratory laparotomy 11/29/2010  . S/P cholecystectomy 11/29/2010  . S/P ACL surgery 11/29/2010  . Morbid obesity ([place]) 11/10/2010   Past Medical History:  Diagnosis Date  . ADD (attention deficit disorder)    takes Adderall daily  . Arthritis    "all over"  . Cerebral infarction ([place]) 10/30/2011  . Cerebrovascular disease 08/14/2016  . Chronic back pain    "all over"  . Chronic low back pain 01/11/2016  . Complication of anesthesia    tends to have hypotension when NPO and post-anesthesia  . Constipation    takes stool softener daily  . Degenerative disk disease   . Degenerative joint disease   . Depression    takes Cymbalta daily for pain per pt  . DVT (deep venous thrombosis) (HCC)    RLE  . Family history of adverse reaction to anesthesia    a family member woke up during surgery; "think it was my mom"  . Fibromyalgia   . Generalized osteoarthritis of multiple sites 11/04/2013  . GERD (  gastroesophageal reflux disease)   . Heart palpitations 12/14/2013  . History of blood clots    superficial  . Hyperglycemia   . Hypothyroid    takes Synthroid daily  . Incomplete emptying of bladder   . Insomnia    takes Trazodone nightly  . Iron deficiency anemia    takes Ferrous Sulfate daily  . Joint pain   . Joint swelling    knees and ankles  . Memory disorder 08/14/2016  . Morbid obesity ([place])   . Nausea    takes Zofran as needed.Seeing GI doc  . Neck pain 05/25/2016  . OSA on CPAP    tested more than 5 yrs ago.    . Osteoarthritis   . PFO (patent foramen ovale)   . Primary osteoarthritis of both feet 05/29/2016   Right bunionectomy August 2017 by Dr. Sharol Given  . Scoliosis   . Sleep apnea   . Stroke [facility]) "several"   right foot weakness; memory issues, black spot right visual field since" (03/23/2015)  . Thrombophlebitis   . Trochanteric  bursitis of both hips 05/25/2016  . Unilateral primary osteoarthritis, right knee 05/29/2016  . Urinary urgency 04/19/2016  . Vein disorder 11/29/2010    Family History  Problem Relation Age of Onset  . Heart disease Father   . Cancer Father   . Cancer Mother   . Heart disease Brother   . Cancer Brother   . Heart disease Sister   . Cancer Maternal Grandfather   . Colon cancer Neg Hx     Past Surgical History:  Procedure Laterality Date  . BUNIONECTOMY Right 08/2015  . CARDIAC CATHETERIZATION     2008.  "it was fine" (not sure why she had it done, and doesn't know where)  . COLONOSCOPY N/A 03/25/2013   Procedure: COLONOSCOPY;  Surgeon: Rogene Houston, MD;  [facility];  Service: Endoscopy;  Laterality: N/A;  930  . ESOPHAGOGASTRODUODENOSCOPY    . EXPLORATORY LAPAROTOMY     "took fallopian tubes out"  . JOINT REPLACEMENT    . KNEE ARTHROSCOPY Left   . KNEE ARTHROSCOPY W/ ACL RECONSTRUCTION Right    "added pins"  . LAPAROSCOPIC CHOLECYSTECTOMY  ~ 2001  . 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 TOTAL KNEE ARTHROPLASTY Left 03/23/2015   Procedure: TOTAL KNEE ARTHROPLASTY;  Surgeon: Newt Minion, MD;  Location: [place];  Service: Orthopedics;  Laterality: Left;  . TOTAL KNEE ARTHROPLASTY Right 08/15/2016   Procedure: RIGHT TOTAL KNEE ARTHROPLASTY, REMOVAL ACL SCREWS;  Surgeon: Newt Minion, MD;  Location: [place];  Service: Orthopedics;  Laterality: Right;  . TOTAL KNEE ARTHROPLASTY WITH HARDWARE REMOVAL Right   . VAGINAL HYSTERECTOMY    . VARICOSE VEIN SURGERY Right X 2   Social History   Occupational History  . Disability   . formerly Therapist, sports, [place] History Main Topics  . Smoking status: Former Smoker    Packs/day: 0.75    Years: 8.00    Types: Cigarettes    Quit date: 12/01/1990  . Smokeless tobacco: Never Used     Comment: quit smoking in the 1990s  . Alcohol use No     Comment: 03/23/2015 "stopped drinking in 2012 w/gastric bypass; drank socially before bypass"    . Drug use: No  . Sexual activity: Not Currently    Birth control/ protection: Surgical

## 2016-09-20 NOTE — Telephone Encounter (Signed)
Patient saw Dr. Louanne Skye today. They are ordering doppler for her. Apologized we are seeing patients.

## 2016-09-20 NOTE — Telephone Encounter (Signed)
Patient wanted to speak with you, she has been diagnosed with a blood disorder that makes her more prone to blood clots. She is currently experiencing a lot of pain in her right calf. She is using ice packs at night for relief. Wanted to know if she could get an order to have an ultrasound. CB # [phone]

## 2016-09-20 NOTE — Patient Instructions (Signed)
Avoid frequent bending and stooping  No lifting greater than 10 lbs. May use ice or moist heat for pain. Weight loss is of benefit. Handicap license is approved. We are scheduling for right leg ultrasound to assess for a DVT. We are scheduling you for right L3-4, L4-5 and L5-S1 facet blocks Do not take arthritis medications while on xarelto.

## 2016-09-24 DIAGNOSIS — G4733 Obstructive sleep apnea (adult) (pediatric): Secondary | ICD-10-CM | POA: Diagnosis not present

## 2016-09-27 ENCOUNTER — Ambulatory Visit (HOSPITAL_COMMUNITY): Payer: PPO | Attending: Orthopedic Surgery | Admitting: Physical Therapy

## 2016-09-27 DIAGNOSIS — R2681 Unsteadiness on feet: Secondary | ICD-10-CM | POA: Insufficient documentation

## 2016-09-27 DIAGNOSIS — R296 Repeated falls: Secondary | ICD-10-CM | POA: Diagnosis not present

## 2016-09-27 DIAGNOSIS — M545 Low back pain: Secondary | ICD-10-CM | POA: Insufficient documentation

## 2016-09-27 DIAGNOSIS — R262 Difficulty in walking, not elsewhere classified: Secondary | ICD-10-CM | POA: Diagnosis not present

## 2016-09-27 DIAGNOSIS — R601 Generalized edema: Secondary | ICD-10-CM | POA: Diagnosis not present

## 2016-09-27 DIAGNOSIS — M25561 Pain in right knee: Secondary | ICD-10-CM

## 2016-09-27 NOTE — Addendum Note (Signed)
Addended by: Leeroy Cha on: 09/27/2016 12:38 PM   Modules accepted: Orders

## 2016-09-27 NOTE — Therapy (Addendum)
[place], [place], [address] Phone: [phone]   Fax:  [phone]  Physical Therapy Evaluation  Patient Details  Name: Debbie Pineda MRN: [id number] Date of Birth: 03-16-1960 Referring Provider: [name]  Encounter Date: 09/27/2016      PT End of Session - 09/27/16 1219    Visit Number 1   Number of Visits 8   Date for PT Re-Evaluation 10/27/16   Authorization - Visit Number 1   Authorization - Number of Visits 8   PT Start Time 0949   PT Stop Time 1033   PT Time Calculation (min) 44 min   Activity Tolerance Patient tolerated treatment well      Past Medical History:  Diagnosis Date  . ADD (attention deficit disorder)    takes Adderall daily  . Arthritis    "all over"  . Cerebral infarction ([place]) 10/30/2011  . Cerebrovascular disease 08/14/2016  . Chronic back pain    "all over"  . Chronic low back pain 01/11/2016  . Complication of anesthesia    tends to have hypotension when NPO and post-anesthesia  . Constipation    takes stool softener daily  . Degenerative disk disease   . Degenerative joint disease   . Depression    takes Cymbalta daily for pain per pt  . DVT (deep venous thrombosis) (HCC)    RLE  . Family history of adverse reaction to anesthesia    a family member woke up during surgery; "think it was my mom"  . Fibromyalgia   . Generalized osteoarthritis of multiple sites 11/04/2013  . GERD (gastroesophageal reflux disease)   . Heart palpitations 12/14/2013  . History of blood clots    superficial  . Hyperglycemia   . Hypothyroid    takes Synthroid daily  . Incomplete emptying of bladder   . Insomnia    takes Trazodone nightly  . Iron deficiency anemia    takes Ferrous Sulfate daily  . Joint pain   . Joint swelling    knees and ankles  . Memory disorder 08/14/2016  . Morbid obesity ([place])   . Nausea    takes Zofran as needed.Seeing GI doc  . Neck pain 05/25/2016  .  OSA on CPAP    tested more than 5 yrs ago.    . Osteoarthritis   . PFO (patent foramen ovale)   . Primary osteoarthritis of both feet 05/29/2016   Right bunionectomy August 2017 by Dr. Sharol Given  . Scoliosis   . Sleep apnea   . Stroke [facility]) "several"   right foot weakness; memory issues, black spot right visual field since" (03/23/2015)  . Thrombophlebitis   . Trochanteric bursitis of both hips 05/25/2016  . Unilateral primary osteoarthritis, right knee 05/29/2016  . Urinary urgency 04/19/2016  . Vein disorder 11/29/2010    Past Surgical History:  Procedure Laterality Date  . BUNIONECTOMY Right 08/2015  . CARDIAC CATHETERIZATION     2008.  "it was fine" (not sure why she had it done, and doesn't know where)  . COLONOSCOPY N/A 03/25/2013   Procedure: COLONOSCOPY;  Surgeon: Rogene Houston, MD;  [facility];  Service: Endoscopy;  Laterality: N/A;  930  . ESOPHAGOGASTRODUODENOSCOPY    . EXPLORATORY LAPAROTOMY     "took fallopian tubes out"  . JOINT REPLACEMENT    . KNEE ARTHROSCOPY Left   . KNEE ARTHROSCOPY W/ ACL RECONSTRUCTION Right    "added pins"  . LAPAROSCOPIC CHOLECYSTECTOMY  ~  2001  . 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 TOTAL KNEE ARTHROPLASTY Left 03/23/2015   Procedure: TOTAL KNEE ARTHROPLASTY;  Surgeon: Newt Minion, MD;  Location: [place];  Service: Orthopedics;  Laterality: Left;  . TOTAL KNEE ARTHROPLASTY Right 08/15/2016   Procedure: RIGHT TOTAL KNEE ARTHROPLASTY, REMOVAL ACL SCREWS;  Surgeon: Newt Minion, MD;  Location: [place];  Service: Orthopedics;  Laterality: Right;  . TOTAL KNEE ARTHROPLASTY WITH HARDWARE REMOVAL Right   . VAGINAL HYSTERECTOMY    . VARICOSE VEIN SURGERY Right X 2    There were no vitals filed for this visit.       Subjective Assessment - 09/27/16 0954    Subjective PT recieved a Rt TKR on 08/15/2016.  She was discharged with [facility] therapy on 08/17/2016; the last treatment for [facility] therapy was on 08/31/2016.  She has not been doing her exercises  since she has been discharged.  She is using her cane to ambulate and is hurting from the top of her foot to her Rt shoulder area.     Pertinent History CVA, DVT, Fibromyalgia, LBP; Lt  TKR    Limitations Sitting;Standing;Walking;Lifting;House hold activities   How long can you sit comfortably? Pt is able to sit for 15 minutes due to her knee pain.    How long can you stand comfortably? Pt is only able to stand for two minutes before her whole body begins to bother her.     How long can you walk comfortably? Pt is walking with a cane and is able to walk for 15 minutes at a time but this caused her increased pain.    Patient Stated Goals To have less pain.     Currently in Pain? Yes   Pain Score 5   worst an 8/10; best 2/10    Pain Location Knee   Pain Orientation Right   Pain Descriptors / Indicators Aching;Sharp;Shooting   Pain Type Chronic pain   Pain Onset More than a month ago   Aggravating Factors  activity    Pain Relieving Factors ice, heat    Effect of Pain on Daily Activities increases    Multiple Pain Sites Yes   Pain Score 5   Pain Location Back   Pain Orientation Lower   Pain Descriptors / Indicators Aching   Pain Type Chronic pain   Pain Onset More than a month ago   Pain Frequency Intermittent   Aggravating Factors  activity   Pain Relieving Factors ice, hip    Effect of Pain on Daily Activities increases             [facility] PT Assessment - 09/27/16 0001      Assessment   Medical Diagnosis Rt TKR   Referring Provider [name]   Onset Date/Surgical Date 08/15/16   Next MD Visit Meridee Score    Prior Therapy [facility]      Precautions   Precautions None     Restrictions   Weight Bearing Restrictions No     Balance Screen   Has the patient fallen in the past 6 months Yes  1 time a few days prior to surgery ,tripped    How many times? 1   Has the patient had a decrease in activity level because of a fear of falling?  Yes   Is the patient reluctant to  leave their home because of a fear of falling?  No     Home Ecologist  residence   Type of [place] to enter   Entrance Stairs-Number of Steps 4  PT pulls herself up   Entrance Stairs-Rails Can reach both   Home Layout One level     Prior Function   Level of Independence Independent   Vocation On disability   Leisure taking care of her granddaughter       Cognition   Overall Cognitive Status Within Functional Limits for tasks assessed     Observation/Other Assessments   Focus on Therapeutic Outcomes (FOTO)  38     Observation/Other Assessments-Edema    Edema Circumferential  Lt: mid joint 44; RT 42.5     Functional Tests   Functional tests Single leg stance;Sit to Stand     Single Leg Stance   Comments Rt 6 seconds; Left 3      Sit to Stand   Comments 5 x 20.53 with weight shifting to Left.      ROM / Strength   AROM / PROM / Strength AROM;Strength     AROM   AROM Assessment Site Knee   Right/Left Knee Right   Right Knee Extension 2   Right Knee Flexion 123     Strength   Strength Assessment Site Hip;Knee;Ankle   Right/Left Hip Right   Right Hip Flexion 5/5   Right Hip Extension 4-/5   Right Hip ABduction 5/5   Right/Left Knee Right;Left   Right Knee Flexion 4+/5   Right Knee Extension 4+/5   Left Knee Flexion 5/5   Left Knee Extension 5/5   Right/Left Ankle Right;Left   Right Ankle Dorsiflexion 5/5   Left Ankle Dorsiflexion 5/5     Flexibility   Soft Tissue Assessment /Muscle Length --            Objective measurements completed on examination: See above findings.          [facility] Adult PT Treatment/Exercise - 09/27/16 0001      Exercises   Exercises Knee/Hip     Knee/Hip Exercises: Supine   Terminal Knee Extension Right;10 reps   Bridges Both;5 reps     Knee/Hip Exercises: Prone   Straight Leg Raises Both;5 reps                PT Education - 09/27/16 1035     Education provided Yes   Education Details HEP   Person(s) Educated Patient   Methods Explanation   Comprehension Verbalized understanding          PT Short Term Goals - 09/27/16 1228      PT SHORT TERM GOAL #1   Title Pt pain in her Rt knee to be no greater than a 6/10 to allow pt to stand/ambulate for 30 minutes without increased discomfort to complete household cleaning activities.l    Time 2   Period Weeks   Status New   Target Date 10/11/16     PT SHORT TERM GOAL #2   Title Pt to be able to sit for 30 minutes without experiencing increased Rt knee pain.    Time 2   Period Weeks   Status New     PT SHORT TERM GOAL #3   Title Pt to be able to single leg stance on both LE for 15 seconds to allow pt to feel confident walking without a cane    Time 2   Period Weeks   Status New  PT Long Term Goals - 28-Sep-2016 1230      PT LONG TERM GOAL #1   Title Pt pain in her right knee to be no greater than a 4/10 to allow her to be up standing/walking for an hour to be able to go shopping    Time 4   Period Weeks   Status New   Target Date 10/25/16     PT LONG TERM GOAL #2   Title Pt to be able to sit in comfort with her right knee for an hour and a half for traveling    Time 4   Period Weeks   Status New     PT LONG TERM GOAL #3   Title Pt to be able to ascend and descend 4 steps in a reciprocal manner without pulling up with her hands to get into and out of her home with increased ease.    Time 4   Period Weeks   Status New     PT LONG TERM GOAL #4   Title Pt to be able to single leg stance for 25 seconds on both LE to be confident walking on uneven ground without an assistive device.   Time 4   Period Weeks   Status New                Plan - 09/28/2016 1220    Clinical Impression Statement Debbie Pineda is a 56 yo female who is continuing to have significant pain in her Rt LE following a TKR. She has been referred to skilled out patient  physical  therapy services.  Evaluation demonstates decreased balance, slight decreased Rt knee extension, slight decreased muscular strength and increased swelling.  She has been prescribed but will not wear compression garment as she states that they increase her pain.  Debbie Pineda will benefit from skilled physical therapy to educate pt on the importance of wearing her compression garments especially since she has had a DVT in the past, improve her functional strength and decrease her the pain in her Rt knee.     History and Personal Factors relevant to plan of care: fibromyalgia, Lt TKR, chronic back pain, CVA, DVT   Clinical Presentation Stable   Clinical Decision Making Low   Rehab Potential Good   PT Frequency 2x / week   PT Duration 4 weeks   PT Treatment/Interventions ADLs/Self Care Home Management;Gait training;Stair training;Functional mobility training;Therapeutic activities;Therapeutic exercise;Balance training;Patient/family education;Manual techniques   PT Next Visit Plan Begin gait training with heel toe gait progressing to without cane, rockerboard, SLS, standing and prone knee flexion, sit to stand manual to decrease pain.  Explain the benefits of reduced risk of DVT and edema with wearing the compression stockings.  Ask pt to bring in compression hose so we can don following manual   PT Home Exercise Plan bridge, terminal extension, prone hip extension.       Patient will benefit from skilled therapeutic intervention in order to improve the following deficits and impairments:  Abnormal gait, Decreased activity tolerance, Decreased balance, Decreased endurance, Decreased range of motion, Decreased strength, Difficulty walking, Pain  Visit Diagnosis: Acute pain of right knee  Difficulty in walking, not elsewhere classified  Generalized edema      G-Codes - 09-28-16 1234    Functional Limitation Changing and maintaining body position   Changing and Maintaining Body Position Current  Status (T7322) At least 60 percent but less than 80 percent impaired, limited or restricted   Changing  and Maintaining Body Position Goal Status [phone]) At least 40 percent but less than 60 percent impaired, limited or restricted       Problem List Patient Active Problem List   Diagnosis Date Noted  . Presence of right artificial knee joint 09/17/2016  . History of total knee arthroplasty, right 08/15/2016  . Presence of retained hardware   . Memory disorder 08/14/2016  . Cerebrovascular disease 08/14/2016  . Unilateral primary osteoarthritis, right knee 05/29/2016  . Primary osteoarthritis of both feet 05/29/2016  . Trochanteric bursitis of both hips 05/25/2016  . Neck pain 05/25/2016  . Urinary urgency 04/19/2016  . Chronic low back pain 01/11/2016  . Depression 11/29/2015  . 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 Heart palpitations 12/14/2013  . Generalized osteoarthritis of multiple sites 11/04/2013  . Cerebral infarction ([place]) 10/30/2011  . PFO (patent foramen ovale) 10/30/2011  . Bradycardia 10/30/2011  . Chest pain 10/30/2011  . Hypothyroid   . Thrombophlebitis   . Sleep apnea   . Fibromyalgia   . 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 Status post bariatric surgery 12/07/2010  . Vein disorder 11/29/2010  . S/P total hysterectomy and bilateral salpingo-oophorectomy 11/29/2010  . S/P exploratory laparotomy 11/29/2010  . S/P cholecystectomy 11/29/2010  . S/P ACL surgery 11/29/2010  . Morbid obesity [facility]) 11/10/2010    Debbie Pineda, PT CLT [phone] 09/27/2016, 12:35 PM  [facility] [address] [place], [place], [address] Phone: [phone]   Fax:  [phone]  Name: Debbie Pineda MRN: [id number] Date of Birth: Jun 04, 1960

## 2016-09-27 NOTE — Patient Instructions (Addendum)
Bridging    Slowly raise buttocks from floor, keeping stomach tight. Repeat __10__ times per set. Do _1___ sets per session. Do _2___ sessions per day.  http://orth.exer.us/1096   Copyright  VHI. All rights reserved.  Straight Leg Raise (Prone)    Abdomen and head supported, keep left knee locked and raise leg at hip. Avoid arching low back. Repeat __10__ times per set. Do _1___ sets per session. Do __2__ sessions per day.  http://orth.exer.us/1112   Copyright  VHI. All rights reserved.  Strengthening: Terminal Knee Extension (Supine)    With right knee over a small bolster, straighten knee by tightening muscles on top of thigh. Keep bottom of knee on bolster. Repeat _10___ times per set. Do __1__ sets per session. Do _2___ sessions per day.  http://orth.exer.us/626   Copyright  VHI. All rights reserved.

## 2016-09-28 DIAGNOSIS — Z6832 Body mass index (BMI) 32.0-32.9, adult: Secondary | ICD-10-CM | POA: Diagnosis not present

## 2016-09-28 DIAGNOSIS — M797 Fibromyalgia: Secondary | ICD-10-CM | POA: Diagnosis not present

## 2016-09-28 DIAGNOSIS — E6609 Other obesity due to excess calories: Secondary | ICD-10-CM | POA: Diagnosis not present

## 2016-09-28 DIAGNOSIS — R203 Hyperesthesia: Secondary | ICD-10-CM | POA: Diagnosis not present

## 2016-10-01 ENCOUNTER — Other Ambulatory Visit: Payer: Self-pay | Admitting: Neurology

## 2016-10-03 ENCOUNTER — Ambulatory Visit (HOSPITAL_COMMUNITY): Payer: PPO | Admitting: Physical Therapy

## 2016-10-03 DIAGNOSIS — M25561 Pain in right knee: Secondary | ICD-10-CM | POA: Diagnosis not present

## 2016-10-03 DIAGNOSIS — R601 Generalized edema: Secondary | ICD-10-CM

## 2016-10-03 DIAGNOSIS — R262 Difficulty in walking, not elsewhere classified: Secondary | ICD-10-CM

## 2016-10-03 NOTE — Therapy (Signed)
[place], [place], [address] Phone: [phone]   Fax:  [phone]  Physical Therapy Treatment  Patient Details  Name: Debbie Pineda MRN: [id number] Date of Birth: [birth year] Referring Provider: [name]  Encounter Date: 10/03/2016      PT End of Session - 10/03/16 1022    Visit Number 2   Number of Visits 8   Date for PT Re-Evaluation 10/27/16   Authorization - Visit Number 2   Authorization - Number of Visits 8   PT Start Time [phone]   PT Stop Time 1030   PT Time Calculation (min) 38 min   Activity Tolerance Patient tolerated treatment well      Past Medical History:  Diagnosis Date  . ADD (attention deficit disorder)    takes Adderall daily  . Arthritis    "all over"  . Cerebral infarction ([place]) 10/30/2011  . Cerebrovascular disease 08/14/2016  . Chronic back pain    "all over"  . Chronic low back pain 01/11/2016  . Complication of anesthesia    tends to have hypotension when NPO and post-anesthesia  . Constipation    takes stool softener daily  . Degenerative disk disease   . Degenerative joint disease   . Depression    takes Cymbalta daily for pain per pt  . DVT (deep venous thrombosis) (HCC)    RLE  . Family history of adverse reaction to anesthesia    a family member woke up during surgery; "think it was my mom"  . Fibromyalgia   . Generalized osteoarthritis of multiple sites 11/04/2013  . GERD (gastroesophageal reflux disease)   . Heart palpitations 12/14/2013  . History of blood clots    superficial  . Hyperglycemia   . Hypothyroid    takes Synthroid daily  . Incomplete emptying of bladder   . Insomnia    takes Trazodone nightly  . Iron deficiency anemia    takes Ferrous Sulfate daily  . Joint pain   . Joint swelling    knees and ankles  . Memory disorder 08/14/2016  . Morbid obesity ([place])   . Nausea    takes Zofran as needed.Seeing GI doc  . Neck pain 05/25/2016  .  OSA on CPAP    tested more than 5 yrs ago.    . Osteoarthritis   . PFO (patent foramen ovale)   . Primary osteoarthritis of both feet 05/29/2016   Right bunionectomy August 2017 by Dr. Sharol Given  . Scoliosis   . Sleep apnea   . Stroke [facility]) "several"   right foot weakness; memory issues, black spot right visual field since" (03/23/2015)  . Thrombophlebitis   . Trochanteric bursitis of both hips 05/25/2016  . Unilateral primary osteoarthritis, right knee 05/29/2016  . Urinary urgency 04/19/2016  . Vein disorder 11/29/2010    Past Surgical History:  Procedure Laterality Date  . BUNIONECTOMY Right 08/2015  . CARDIAC CATHETERIZATION     2008.  "it was fine" (not sure why she had it done, and doesn't know where)  . COLONOSCOPY N/A 03/25/2013   Procedure: COLONOSCOPY;  Surgeon: Rogene Houston, MD;  [facility];  Service: Endoscopy;  Laterality: N/A;  930  . ESOPHAGOGASTRODUODENOSCOPY    . EXPLORATORY LAPAROTOMY     "took fallopian tubes out"  . JOINT REPLACEMENT    . KNEE ARTHROSCOPY Left   . KNEE ARTHROSCOPY W/ ACL RECONSTRUCTION Right    "added pins"  . LAPAROSCOPIC CHOLECYSTECTOMY  ~  2001  . 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 TOTAL KNEE ARTHROPLASTY Left 03/23/2015   Procedure: TOTAL KNEE ARTHROPLASTY;  Surgeon: Newt Minion, MD;  Location: [place];  Service: Orthopedics;  Laterality: Left;  . TOTAL KNEE ARTHROPLASTY Right 08/15/2016   Procedure: RIGHT TOTAL KNEE ARTHROPLASTY, REMOVAL ACL SCREWS;  Surgeon: Newt Minion, MD;  Location: [place];  Service: Orthopedics;  Laterality: Right;  . TOTAL KNEE ARTHROPLASTY WITH HARDWARE REMOVAL Right   . VAGINAL HYSTERECTOMY    . VARICOSE VEIN SURGERY Right X 2    There were no vitals filed for this visit.      Subjective Assessment - 10/03/16 0957    Subjective Pt states that she is having more trouble with her back than her knee.    Pertinent History CVA, DVT, Fibromyalgia, LBP; Lt  TKR    Limitations  Sitting;Standing;Walking;Lifting;House hold activities   How long can you sit comfortably? Pt is able to sit for 15 minutes due to her knee pain.    How long can you stand comfortably? Pt is only able to stand for two minutes before her whole body begins to bother her.     How long can you walk comfortably? Pt is walking with a cane and is able to walk for 15 minutes at a time but this caused her increased pain.    Patient Stated Goals To have less pain.     Currently in Pain? Yes   Pain Score 4    Pain Location Knee   Pain Orientation Right   Pain Descriptors / Indicators Aching   Pain Type Acute pain   Pain Onset More than a month ago   Multiple Pain Sites Yes   Pain Score 4   Pain Location Back   Pain Orientation Lower   Pain Descriptors / Indicators Aching   Pain Onset More than a month ago   Pain Frequency Intermittent                         [facility] Adult PT Treatment/Exercise - 10/03/16 0001      Exercises   Exercises Knee/Hip     Knee/Hip Exercises: Stretches   Active Hamstring Stretch Right;3 reps;30 seconds   Active Hamstring Stretch Limitations supine    Knee: Self-Stretch to increase Flexion Left;3 reps;30 seconds   Other Knee/Hip Stretches slant board x 3     Knee/Hip Exercises: Standing   Heel Raises Both;10 reps   Knee Flexion Right;10 reps   Functional Squat 10 reps   Rocker Board 2 minutes   SLS 3x     Knee/Hip Exercises: Supine   Terminal Knee Extension Right;10 reps   Bridges Both;10 reps     Knee/Hip Exercises: Sidelying   Hip ABduction --     Manual Therapy   Manual Therapy Edema management   Manual therapy comments done seperate from all other aspects of treatment   Edema Management efflurage/pettrisage                   PT Short Term Goals - 09/27/16 1228      PT SHORT TERM GOAL #1   Title Pt pain in her Rt knee to be no greater than a 6/10 to allow pt to stand/ambulate for 30 minutes without increased discomfort  to complete household cleaning activities.l    Time 2   Period Weeks   Status New   Target Date 10/11/16  PT SHORT TERM GOAL #2   Title Pt to be able to sit for 30 minutes without experiencing increased Rt knee pain.    Time 2   Period Weeks   Status New     PT SHORT TERM GOAL #3   Title Pt to be able to single leg stance on both LE for 15 seconds to allow pt to feel confident walking without a cane    Time 2   Period Weeks   Status New           PT Long Term Goals - 09/27/16 1230      PT LONG TERM GOAL #1   Title Pt pain in her right knee to be no greater than a 4/10 to allow her to be up standing/walking for an hour to be able to go shopping    Time 4   Period Weeks   Status New   Target Date 10/25/16     PT LONG TERM GOAL #2   Title Pt to be able to sit in comfort with her right knee for an hour and a half for traveling    Time 4   Period Weeks   Status New     PT LONG TERM GOAL #3   Title Pt to be able to ascend and descend 4 steps in a reciprocal manner without pulling up with her hands to get into and out of her home with increased ease.    Time 4   Period Weeks   Status New     PT LONG TERM GOAL #4   Title Pt to be able to single leg stance for 25 seconds on both LE to be confident walking on uneven ground without an assistive device.   Time 4   Period Weeks   Status New               Plan - 10/03/16 1022    Clinical Impression Statement Evaluation and goals reviewed with patient.  Added weight bearing activity to progra.     Rehab Potential Good   PT Frequency 2x / week   PT Duration 4 weeks   PT Treatment/Interventions ADLs/Self Care Home Management;Gait training;Stair training;Functional mobility training;Therapeutic activities;Therapeutic exercise;Balance training;Patient/family education;Manual techniques   PT Next Visit Plan Add  prone knee flexion, sit to stand manual to decrease pain.  Gait train.  Explain the benefits of reduced risk  of DVT and edema with wearing the compression stockings.  Ask pt to bring in compression hose so we can don following manual   PT Home Exercise Plan bridge, terminal extension, prone hip extension.       Patient will benefit from skilled therapeutic intervention in order to improve the following deficits and impairments:  Abnormal gait, Decreased activity tolerance, Decreased balance, Decreased endurance, Decreased range of motion, Decreased strength, Difficulty walking, Pain  Visit Diagnosis: Acute pain of right knee  Difficulty in walking, not elsewhere classified  Generalized edema     Problem List Patient Active Problem List   Diagnosis Date Noted  . Presence of right artificial knee joint 09/17/2016  . History of total knee arthroplasty, right 08/15/2016  . Presence of retained hardware   . Memory disorder 08/14/2016  . Cerebrovascular disease 08/14/2016  . Unilateral primary osteoarthritis, right knee 05/29/2016  . Primary osteoarthritis of both feet 05/29/2016  . Trochanteric bursitis of both hips 05/25/2016  . Neck pain 05/25/2016  . Urinary urgency 04/19/2016  . Chronic low back pain 01/11/2016  .  Depression 11/29/2015  . 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 Heart palpitations 12/14/2013  . Generalized osteoarthritis of multiple sites 11/04/2013  . Cerebral infarction ([place]) 10/30/2011  . PFO (patent foramen ovale) 10/30/2011  . Bradycardia 10/30/2011  . Chest pain 10/30/2011  . Hypothyroid   . Thrombophlebitis   . Sleep apnea   . Fibromyalgia   . 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 Status post bariatric surgery 12/07/2010  . Vein disorder 11/29/2010  . S/P total hysterectomy and bilateral salpingo-oophorectomy 11/29/2010  . S/P exploratory laparotomy 11/29/2010  . S/P cholecystectomy 11/29/2010  . S/P ACL surgery 11/29/2010  . Morbid obesity [facility]) 11/10/2010    Rayetta Humphrey, PT CLT [phone] 10/03/2016, 10:35 AM  [place] [address] [place], [place], [address] Phone: [phone]   Fax:  [phone]  Name: Debbie Pineda MRN: [id number] Date of Birth: 1961/01/10

## 2016-10-03 NOTE — Patient Instructions (Addendum)
Heel Raise: Bilateral (Standing)    Rise on balls of feet. Repeat __10__ times per set. Do _1___ sets per session. Do __2__ sessions per day.  http://orth.exer.us/38   Copyright  VHI. All rights reserved.  Functional Quadriceps: Chair Squat    Keeping feet flat on floor, shoulder width apart, squat as low as is comfortable. Use support as necessary. Repeat _10___ times per set. Do __1__ sets per session. Do __2__ sessions per day.  http://orth.exer.us/736   Copyright  VHI. All rights reserved.  Balance: Unilateral    Attempt to balance on left leg, eyes open. Hold __10-30__ seconds. Repeat __3-5__ times per set. Do 1____ sets per session. Do _2___ sessions per day. Perform exercise with eyes closed.  http://orth.exer.us/28   Copyright  VHI. All rights reserved.

## 2016-10-04 ENCOUNTER — Encounter (INDEPENDENT_AMBULATORY_CARE_PROVIDER_SITE_OTHER): Payer: Self-pay | Admitting: Physical Medicine and Rehabilitation

## 2016-10-04 ENCOUNTER — Ambulatory Visit (INDEPENDENT_AMBULATORY_CARE_PROVIDER_SITE_OTHER): Payer: Self-pay

## 2016-10-04 ENCOUNTER — Other Ambulatory Visit (INDEPENDENT_AMBULATORY_CARE_PROVIDER_SITE_OTHER): Payer: Self-pay | Admitting: Internal Medicine

## 2016-10-04 ENCOUNTER — Ambulatory Visit (INDEPENDENT_AMBULATORY_CARE_PROVIDER_SITE_OTHER): Payer: PPO | Admitting: Physical Medicine and Rehabilitation

## 2016-10-04 VITALS — BP 117/71 | HR 72

## 2016-10-04 DIAGNOSIS — M47816 Spondylosis without myelopathy or radiculopathy, lumbar region: Secondary | ICD-10-CM | POA: Diagnosis not present

## 2016-10-04 DIAGNOSIS — G8929 Other chronic pain: Secondary | ICD-10-CM | POA: Diagnosis not present

## 2016-10-04 DIAGNOSIS — M545 Low back pain, unspecified: Secondary | ICD-10-CM

## 2016-10-04 MED ORDER — LIDOCAINE HCL (PF) 1 % IJ SOLN
2.0000 mL | Freq: Once | INTRAMUSCULAR | Status: AC
  Administered 2016-10-04: 2 mL

## 2016-10-04 MED ORDER — METHYLPREDNISOLONE ACETATE 80 MG/ML IJ SUSP
80.0000 mg | Freq: Once | INTRAMUSCULAR | Status: AC
  Administered 2016-10-04: 80 mg

## 2016-10-04 NOTE — Patient Instructions (Signed)

## 2016-10-04 NOTE — Progress Notes (Deleted)
Right side low back pain. Radiates down leg to foot at time. "Throbbing pain" States pain is worse with laying and when she has been feet all day.

## 2016-10-05 ENCOUNTER — Ambulatory Visit (HOSPITAL_COMMUNITY): Payer: PPO | Admitting: Physical Therapy

## 2016-10-05 ENCOUNTER — Encounter (HOSPITAL_COMMUNITY): Payer: Self-pay | Admitting: Physical Therapy

## 2016-10-05 DIAGNOSIS — R601 Generalized edema: Secondary | ICD-10-CM

## 2016-10-05 DIAGNOSIS — R2681 Unsteadiness on feet: Secondary | ICD-10-CM

## 2016-10-05 DIAGNOSIS — M25561 Pain in right knee: Secondary | ICD-10-CM | POA: Diagnosis not present

## 2016-10-05 DIAGNOSIS — M545 Low back pain, unspecified: Secondary | ICD-10-CM

## 2016-10-05 DIAGNOSIS — R296 Repeated falls: Secondary | ICD-10-CM

## 2016-10-05 DIAGNOSIS — R262 Difficulty in walking, not elsewhere classified: Secondary | ICD-10-CM

## 2016-10-05 NOTE — Therapy (Signed)
[place], [place], [address] Phone: [phone]   Fax:  [phone]  Physical Therapy Treatment  Patient Details  Name: Debbie Pineda MRN: [id number] Date of Birth: [birth year] Referring Provider: [name]  Encounter Date: 10/05/2016      PT End of Session - 10/05/16 0955    Visit Number 3   Number of Visits 8   Date for PT Re-Evaluation 10/27/16   Authorization - Visit Number 3   Authorization - Number of Visits 8   PT Start Time 0915  Pt late for appointment    PT Stop Time 0945   PT Time Calculation (min) 30 min   Activity Tolerance Patient tolerated treatment well   Behavior During Therapy [facility] for tasks assessed/performed      Past Medical History:  Diagnosis Date  . ADD (attention deficit disorder)    takes Adderall daily  . Arthritis    "all over"  . Cerebral infarction ([place]) 10/30/2011  . Cerebrovascular disease 08/14/2016  . Chronic back pain    "all over"  . Chronic low back pain 01/11/2016  . Complication of anesthesia    tends to have hypotension when NPO and post-anesthesia  . Constipation    takes stool softener daily  . Degenerative disk disease   . Degenerative joint disease   . Depression    takes Cymbalta daily for pain per pt  . DVT (deep venous thrombosis) (HCC)    RLE  . Family history of adverse reaction to anesthesia    a family member woke up during surgery; "think it was my mom"  . Fibromyalgia   . Generalized osteoarthritis of multiple sites 11/04/2013  . GERD (gastroesophageal reflux disease)   . Heart palpitations 12/14/2013  . History of blood clots    superficial  . Hyperglycemia   . Hypothyroid    takes Synthroid daily  . Incomplete emptying of bladder   . Insomnia    takes Trazodone nightly  . Iron deficiency anemia    takes Ferrous Sulfate daily  . Joint pain   . Joint swelling    knees and ankles  . Memory disorder 08/14/2016  . Morbid obesity  ([place])   . Nausea    takes Zofran as needed.Seeing GI doc  . Neck pain 05/25/2016  . OSA on CPAP    tested more than 5 yrs ago.    . Osteoarthritis   . PFO (patent foramen ovale)   . Primary osteoarthritis of both feet 05/29/2016   Right bunionectomy August 2017 by Dr. Sharol Given  . Scoliosis   . Sleep apnea   . Stroke [facility]) "several"   right foot weakness; memory issues, black spot right visual field since" (03/23/2015)  . Thrombophlebitis   . Trochanteric bursitis of both hips 05/25/2016  . Unilateral primary osteoarthritis, right knee 05/29/2016  . Urinary urgency 04/19/2016  . Vein disorder 11/29/2010    Past Surgical History:  Procedure Laterality Date  . BUNIONECTOMY Right 08/2015  . CARDIAC CATHETERIZATION     2008.  "it was fine" (not sure why she had it done, and doesn't know where)  . COLONOSCOPY N/A 03/25/2013   Procedure: COLONOSCOPY;  Surgeon: Rogene Houston, MD;  [facility];  Service: Endoscopy;  Laterality: N/A;  930  . ESOPHAGOGASTRODUODENOSCOPY    . EXPLORATORY LAPAROTOMY     "took fallopian tubes out"  . JOINT REPLACEMENT    . KNEE ARTHROSCOPY Left   .  KNEE ARTHROSCOPY W/ ACL RECONSTRUCTION Right    "added pins"  . LAPAROSCOPIC CHOLECYSTECTOMY  ~ 2001  . 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 TOTAL KNEE ARTHROPLASTY Left 03/23/2015   Procedure: TOTAL KNEE ARTHROPLASTY;  Surgeon: Newt Minion, MD;  Location: [place];  Service: Orthopedics;  Laterality: Left;  . TOTAL KNEE ARTHROPLASTY Right 08/15/2016   Procedure: RIGHT TOTAL KNEE ARTHROPLASTY, REMOVAL ACL SCREWS;  Surgeon: Newt Minion, MD;  Location: [place];  Service: Orthopedics;  Laterality: Right;  . TOTAL KNEE ARTHROPLASTY WITH HARDWARE REMOVAL Right   . VAGINAL HYSTERECTOMY    . VARICOSE VEIN SURGERY Right X 2    There were no vitals filed for this visit.      Subjective Assessment - 10/05/16 0913    Subjective Pt states that her back is bothering her more than anything else.  She had three injections  yesterday in her back.   Pertinent History CVA, DVT, Fibromyalgia, LBP; Lt  TKR    Limitations Sitting;Standing;Walking;Lifting;House hold activities   How long can you sit comfortably? Pt is able to sit for 15 minutes due to her knee pain.    How long can you stand comfortably? Pt is only able to stand for two minutes before her whole body begins to bother her.     How long can you walk comfortably? Pt is walking with a cane and is able to walk for 15 minutes at a time but this caused her increased pain.    Patient Stated Goals To have less pain.     Currently in Pain? Yes   Pain Score 2    Pain Location Knee   Pain Orientation Right   Pain Descriptors / Indicators Aching   Pain Type Acute pain   Pain Onset 1 to 4 weeks ago   Pain Score 6  has had medication already    Pain Location Back   Pain Orientation Lower   Pain Descriptors / Indicators Aching   Pain Type Chronic pain   Pain Onset More than a month ago   Aggravating Factors  activity   Pain Relieving Factors injection and medication    Effect of Pain on Daily Activities increases                          [facility] Adult PT Treatment/Exercise - 10/05/16 0001      Exercises   Exercises Knee/Hip     Knee/Hip Exercises: Stretches   Active Hamstring Stretch Right;3 reps;30 seconds   Active Hamstring Stretch Limitations supine   Knee: Self-Stretch to increase Flexion Left;3 reps;30 seconds   Other Knee/Hip Stretches slant board x 3     Knee/Hip Exercises: Standing   Heel Raises Both;10 reps   Knee Flexion Right;10 reps   Knee Flexion Limitations 2# weight   Terminal Knee Extension Right;10 reps   Terminal Knee Extension Limitations manual resistance   Forward Step Up Right;10 reps   Forward Step Up Limitations 4 inches   Functional Squat 10 reps   Rocker Board 2 minutes   SLS 3x     Knee/Hip Exercises: Supine   Terminal Knee Extension Right;10 reps   Bridges Both;10 reps     Manual Therapy   Manual  Therapy Edema management   Manual therapy comments done seperate from all other aspects of treatment   Edema Management efflurage/pettrisage  PT Short Term Goals - 09/27/16 1228      PT SHORT TERM GOAL #1   Title Pt pain in her Rt knee to be no greater than a 6/10 to allow pt to stand/ambulate for 30 minutes without increased discomfort to complete household cleaning activities.l    Time 2   Period Weeks   Status New   Target Date 10/11/16     PT SHORT TERM GOAL #2   Title Pt to be able to sit for 30 minutes without experiencing increased Rt knee pain.    Time 2   Period Weeks   Status New     PT SHORT TERM GOAL #3   Title Pt to be able to single leg stance on both LE for 15 seconds to allow pt to feel confident walking without a cane    Time 2   Period Weeks   Status New           PT Long Term Goals - 09/27/16 1230      PT LONG TERM GOAL #1   Title Pt pain in her right knee to be no greater than a 4/10 to allow her to be up standing/walking for an hour to be able to go shopping    Time 4   Period Weeks   Status New   Target Date 10/25/16     PT LONG TERM GOAL #2   Title Pt to be able to sit in comfort with her right knee for an hour and a half for traveling    Time 4   Period Weeks   Status New     PT LONG TERM GOAL #3   Title Pt to be able to ascend and descend 4 steps in a reciprocal manner without pulling up with her hands to get into and out of her home with increased ease.    Time 4   Period Weeks   Status New     PT LONG TERM GOAL #4   Title Pt to be able to single leg stance for 25 seconds on both LE to be confident walking on uneven ground without an assistive device.   Time 4   Period Weeks   Status New               Plan - 10/05/16 0953    Clinical Impression Statement Pt tolerated today's session well. She presented with reports of lumbar and R knee irritation throughout standing therex and increased stretch  into R lateral leg during supine hamstring stretch. She continues to require cueing throughout standing balance therex secondary to decreased posterior chain strength. continues to benefit from Skilled PT.    Rehab Potential Good   PT Frequency 2x / week   PT Duration 4 weeks   PT Treatment/Interventions ADLs/Self Care Home Management;Gait training;Stair training;Functional mobility training;Therapeutic activities;Therapeutic exercise;Balance training;Patient/family education;Manual techniques   PT Next Visit Plan Unable to complete last plan due to pt coming in 15 minutes late will add plane and manual back into program next visit.  Add  prone knee flexion, sit to stand manual to decrease pain.  Explain the benefits of reduced risk of DVT and edema with wearing the compression stockings.  Ask pt to bring in compression hose so we can don following manual.     PT Home Exercise Plan bridge, terminal extension, prone hip extension.       Patient will benefit from skilled therapeutic intervention in order to improve the following deficits  and impairments:  Abnormal gait, Decreased activity tolerance, Decreased balance, Decreased endurance, Decreased range of motion, Decreased strength, Difficulty walking, Pain  Visit Diagnosis: Acute pain of right knee  Difficulty in walking, not elsewhere classified  Generalized edema  Unsteadiness on feet  Repeated falls  Acute bilateral low back pain without sciatica     Problem List Patient Active Problem List   Diagnosis Date Noted  . Presence of right artificial knee joint 09/17/2016  . History of total knee arthroplasty, right 08/15/2016  . Presence of retained hardware   . Memory disorder 08/14/2016  . Cerebrovascular disease 08/14/2016  . Unilateral primary osteoarthritis, right knee 05/29/2016  . Primary osteoarthritis of both feet 05/29/2016  . Trochanteric bursitis of both hips 05/25/2016  . Neck pain 05/25/2016  . Urinary urgency  04/19/2016  . Chronic low back pain 01/11/2016  . Depression 11/29/2015  . 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 Heart palpitations 12/14/2013  . Generalized osteoarthritis of multiple sites 11/04/2013  . Cerebral infarction ([place]) 10/30/2011  . PFO (patent foramen ovale) 10/30/2011  . Bradycardia 10/30/2011  . Chest pain 10/30/2011  . Hypothyroid   . Thrombophlebitis   . Sleep apnea   . Fibromyalgia   . 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 Status post bariatric surgery 12/07/2010  . Vein disorder 11/29/2010  . S/P total hysterectomy and bilateral salpingo-oophorectomy 11/29/2010  . S/P exploratory laparotomy 11/29/2010  . S/P cholecystectomy 11/29/2010  . S/P ACL surgery 11/29/2010  . Morbid obesity [facility]) 11/10/2010   Rayetta Humphrey, PT CLT [phone] 10/05/2016, 10:46 AM  [place], [place], [address] Phone: [phone]   Fax:  [phone]  Name: DALYCE RENNE MRN: [id number] Date of Birth: 10-12-1960

## 2016-10-08 ENCOUNTER — Ambulatory Visit (HOSPITAL_COMMUNITY): Payer: PPO | Admitting: Physical Therapy

## 2016-10-08 ENCOUNTER — Encounter (HOSPITAL_COMMUNITY): Payer: Self-pay | Admitting: Physical Therapy

## 2016-10-08 DIAGNOSIS — R262 Difficulty in walking, not elsewhere classified: Secondary | ICD-10-CM

## 2016-10-08 DIAGNOSIS — M25561 Pain in right knee: Secondary | ICD-10-CM

## 2016-10-08 DIAGNOSIS — R601 Generalized edema: Secondary | ICD-10-CM

## 2016-10-08 NOTE — Patient Instructions (Addendum)
Backward Bend (Standing)    Arch backward to make hollow of back deeper. Hold _3-_5__ sec 2__ sessions per day.  http://orth.exer.us/178   Copyright  VHI. All rights reserved.  Thoracolumbar Side-Bend: Single Arm (Standing)    Reach over head to other side with right arm until stretch is felt. Hold __3-5__ seconds. Relax. Repeat _10___ times per set. Do __1__ sets per session. Do __2__ sessions per day.  http://orth.exer.us/262   Copyright  VHI. All rights reserved.

## 2016-10-08 NOTE — Procedures (Signed)
[name] is a 56 year old female who recently underwent total knee arthroplasty. Prior to that we completed denervation of the right facet joints at L2-3, L3-4 L4-5 and L5-S1. She had obtained good relief with the denervation. Now she is having right-sided symptoms again with throbbing pain worse with lying and when she's been on her feet all day. Her case is complicated by significant fibromyalgia. Dr. Louanne Skye requests diagnostic facet blocks to see if this indeed is a source of her pain. These to be intra-articular facet blocks facet medial branch blocks. If she does not get relief from not sure injection treatment is beneficial for her.  Lumbar Facet Joint Intra-Articular Injection(s) with Fluoroscopic Guidance  Patient: Debbie Pineda      Date of Birth: 1960-12-01 MRN: [id number] PCP: Jake Samples, PA-C      Visit Date: 10/04/2016   Universal Protocol:    Date/Time: 10/04/2016  Consent Given By: the patient  Position: PRONE   Additional Comments: Vital signs were monitored before and after the procedure. Patient was prepped and draped in the usual sterile fashion. The correct patient, procedure, and site was verified.   Injection Procedure Details:  Procedure Site One Meds Administered:  Meds ordered this encounter  Medications  . lidocaine (PF) (XYLOCAINE) 1 % injection 2 mL  . methylPREDNISolone acetate (DEPO-MEDROL) injection 80 mg     Laterality: Right  Location/Site:  L3-L4 L4-L5 L5-S1  Needle size: 22 guage  Needle type: Spinal  Needle Placement: Articular  Findings:  -Contrast Used: 1 mL iohexol 180 mg iodine/mL   -Comments: Excellent flow of contrast producing a partial arthrogram.  Procedure Details: The fluoroscope beam is vertically oriented in AP, and the inferior recess is visualized beneath the lower pole of the inferior apophyseal process, which represents the target point for needle insertion. When direct visualization is difficult the  target point is located at the medial projection of the vertebral pedicle. The region overlying each aforementioned target is locally anesthetized with a 1 to 2 ml. volume of 1% Lidocaine without Epinephrine.   The spinal needle was inserted into each of the above mentioned facet joints using biplanar fluoroscopic guidance. A 0.25 to 0.5 ml. volume of Isovue-250 was injected and a partial facet joint arthrogram was obtained. A single spot film was obtained of the resulting arthrogram.    One to 1.25 ml of the steroid/anesthetic solution was then injected into each of the facet joints noted above.   Additional Comments:  The patient tolerated the procedure well Dressing: Band-Aid    Post-procedure details: Patient was observed during the procedure. Post-procedure instructions were reviewed.  Patient left the clinic in stable condition.

## 2016-10-08 NOTE — Therapy (Signed)
[place], [place], [address] Phone: [phone]   Fax:  [phone]  Physical Therapy Treatment  Patient Details  Name: Debbie Pineda MRN: [id number] Date of Birth: 02-01-1960 Referring Provider: [name]  Encounter Date: 10/08/2016      PT End of Session - 10/08/16 0840    Visit Number 4   Number of Visits 8   Date for PT Re-Evaluation 10/27/16   Authorization - Visit Number 4   Authorization - Number of Visits 8   PT Start Time 0818   PT Stop Time 0900   PT Time Calculation (min) 42 min   Activity Tolerance Patient tolerated treatment well   Behavior During Therapy [facility] for tasks assessed/performed      Past Medical History:  Diagnosis Date  . ADD (attention deficit disorder)    takes Adderall daily  . Arthritis    "all over"  . Cerebral infarction ([place]) 10/30/2011  . Cerebrovascular disease 08/14/2016  . Chronic back pain    "all over"  . Chronic low back pain 01/11/2016  . Complication of anesthesia    tends to have hypotension when NPO and post-anesthesia  . Constipation    takes stool softener daily  . Degenerative disk disease   . Degenerative joint disease   . Depression    takes Cymbalta daily for pain per pt  . DVT (deep venous thrombosis) (HCC)    RLE  . Family history of adverse reaction to anesthesia    a family member woke up during surgery; "think it was my mom"  . Fibromyalgia   . Generalized osteoarthritis of multiple sites 11/04/2013  . GERD (gastroesophageal reflux disease)   . Heart palpitations 12/14/2013  . History of blood clots    superficial  . Hyperglycemia   . Hypothyroid    takes Synthroid daily  . Incomplete emptying of bladder   . Insomnia    takes Trazodone nightly  . Iron deficiency anemia    takes Ferrous Sulfate daily  . Joint pain   . Joint swelling    knees and ankles  . Memory disorder 08/14/2016  . Morbid obesity ([place])   . Nausea    takes Zofran as needed.Seeing GI doc  . Neck pain 05/25/2016  . OSA on CPAP    tested more than 5 yrs ago.    . Osteoarthritis   . PFO (patent foramen ovale)   . Primary osteoarthritis of both feet 05/29/2016   Right bunionectomy August 2017 by Dr. Sharol Given  . Scoliosis   . Sleep apnea   . Stroke [facility]) "several"   right foot weakness; memory issues, black spot right visual field since" (03/23/2015)  . Thrombophlebitis   . Trochanteric bursitis of both hips 05/25/2016  . Unilateral primary osteoarthritis, right knee 05/29/2016  . Urinary urgency 04/19/2016  . Vein disorder 11/29/2010    Past Surgical History:  Procedure Laterality Date  . BUNIONECTOMY Right 08/2015  . CARDIAC CATHETERIZATION     2008.  "it was fine" (not sure why she had it done, and doesn't know where)  . COLONOSCOPY N/A 03/25/2013   Procedure: COLONOSCOPY;  Surgeon: Rogene Houston, MD;  [facility];  Service: Endoscopy;  Laterality: N/A;  930  . ESOPHAGOGASTRODUODENOSCOPY    . EXPLORATORY LAPAROTOMY     "took fallopian tubes out"  . JOINT REPLACEMENT    . KNEE ARTHROSCOPY Left   . KNEE ARTHROSCOPY W/ ACL RECONSTRUCTION Right    "  added pins"  . LAPAROSCOPIC CHOLECYSTECTOMY  ~ 2001  . 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 TOTAL KNEE ARTHROPLASTY Left 03/23/2015   Procedure: TOTAL KNEE ARTHROPLASTY;  Surgeon: Newt Minion, MD;  Location: [facility];  Service: Orthopedics;  Laterality: Left;  . TOTAL KNEE ARTHROPLASTY Right 08/15/2016   Procedure: RIGHT TOTAL KNEE ARTHROPLASTY, REMOVAL ACL SCREWS;  Surgeon: Newt Minion, MD;  Location: [facility];  Service: Orthopedics;  Laterality: Right;  . TOTAL KNEE ARTHROPLASTY WITH HARDWARE REMOVAL Right   . VAGINAL HYSTERECTOMY    . VARICOSE VEIN SURGERY Right X 2    There were no vitals filed for this visit.      Subjective Assessment - 10/08/16 0822    Subjective Pt states that she is really hurting now.    Pertinent History CVA, DVT, Fibromyalgia, LBP; Lt  TKR     Limitations Sitting;Standing;Walking;Lifting;House hold activities   How long can you sit comfortably? Pt is able to sit for 15 minutes due to her knee pain.    How long can you stand comfortably? Pt is only able to stand for two minutes before her whole body begins to bother her.     How long can you walk comfortably? Pt is walking with a cane and is able to walk for 15 minutes at a time but this caused her increased pain.    Patient Stated Goals To have less pain.     Currently in Pain? Yes   Pain Score 4    Pain Location Knee   Pain Orientation Right   Pain Descriptors / Indicators Aching   Pain Type Acute pain   Pain Onset 1 to 4 weeks ago   Pain Score 6   Pain Location Back   Pain Orientation Lower   Pain Descriptors / Indicators Aching   Pain Onset More than a month ago   Pain Frequency Constant                         [facility] Adult PT Treatment/Exercise - 10/08/16 0001      Exercises   Exercises Knee/Hip     Knee/Hip Exercises: Stretches   Active Hamstring Stretch Right;3 reps;30 seconds   Active Hamstring Stretch Limitations supine   Passive Hamstring Stretch Both;3 reps;30 seconds   Knee: Self-Stretch to increase Flexion Right;3 reps;30 seconds   Other Knee/Hip Stretches slant board x 3   Other Knee/Hip Stretches back extension and sidebend x 3     Knee/Hip Exercises: Standing   Heel Raises Both;10 reps   Knee Flexion Right;15 reps   Knee Flexion Limitations 2# weight   Terminal Knee Extension Right;10 reps   Terminal Knee Extension Limitations manual resistance   Lateral Step Up Right;10 reps   Forward Step Up Right;10 reps   Forward Step Up Limitations 4 inches   Functional Squat 15 reps   Rocker Board 2 minutes   SLS 4x     Knee/Hip Exercises: Seated   Sit to Sand 5 reps     Knee/Hip Exercises: Supine   Terminal Knee Extension --   Bridges --     Manual Therapy   Manual Therapy Edema management   Manual therapy comments done seperate  from all other aspects of treatment   Edema Management efflurage/pettrisage                   PT Short Term Goals - 10/08/16 3235      PT  SHORT TERM GOAL #1   Title Pt pain in her Rt knee to be no greater than a 6/10 to allow pt to stand/ambulate for 30 minutes without increased discomfort to complete household cleaning activities.l    Time 2   Period Weeks   Status On-going     PT SHORT TERM GOAL #2   Title Pt to be able to sit for 30 minutes without experiencing increased Rt knee pain.    Time 2   Period Weeks   Status On-going     PT SHORT TERM GOAL #3   Title Pt to be able to single leg stance on both LE for 15 seconds to allow pt to feel confident walking without a cane    Time 2   Period Weeks   Status On-going           PT Long Term Goals - 10/08/16 0856      PT LONG TERM GOAL #1   Title Pt pain in her right knee to be no greater than a 4/10 to allow her to be up standing/walking for an hour to be able to go shopping    Time 4   Period Weeks   Status On-going     PT LONG TERM GOAL #2   Title Pt to be able to sit in comfort with her right knee for an hour and a half for traveling    Time 4   Period Weeks   Status On-going     PT LONG TERM GOAL #3   Title Pt to be able to ascend and descend 4 steps in a reciprocal manner without pulling up with her hands to get into and out of her home with increased ease.    Time 4   Period Weeks   Status On-going     PT LONG TERM GOAL #4   Title Pt to be able to single leg stance for 25 seconds on both LE to be confident walking on uneven ground without an assistive device.   Time 4   Period Weeks   Status On-going               Plan - 10/08/16 9485    Clinical Impression Statement Pt continues to complain more of her back than her knee.  Pt progressed in repetition for exercises; added lateral step ups. sit to stand and prone knee flexion    Rehab Potential Good   PT Frequency 2x / week   PT  Duration 4 weeks   PT Treatment/Interventions ADLs/Self Care Home Management;Gait training;Stair training;Functional mobility training;Therapeutic activities;Therapeutic exercise;Balance training;Patient/family education;Manual techniques   PT Next Visit Plan Don compression garment next visit;(pt is suppose bring garment in with her)   PT Home Exercise Plan bridge, terminal extension, prone hip extension.       Patient will benefit from skilled therapeutic intervention in order to improve the following deficits and impairments:  Abnormal gait, Decreased activity tolerance, Decreased balance, Decreased endurance, Decreased range of motion, Decreased strength, Difficulty walking, Pain  Visit Diagnosis: Acute pain of right knee  Difficulty in walking, not elsewhere classified  Generalized edema     Problem List Patient Active Problem List   Diagnosis Date Noted  . Presence of right artificial knee joint 09/17/2016  . History of total knee arthroplasty, right 08/15/2016  . Presence of retained hardware   . Memory disorder 08/14/2016  . Cerebrovascular disease 08/14/2016  . Unilateral primary osteoarthritis, right knee 05/29/2016  . Primary  osteoarthritis of both feet 05/29/2016  . Trochanteric bursitis of both hips 05/25/2016  . Neck pain 05/25/2016  . Urinary urgency 04/19/2016  . Chronic low back pain 01/11/2016  . Depression 11/29/2015  . 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 Heart palpitations 12/14/2013  . Generalized osteoarthritis of multiple sites 11/04/2013  . Cerebral infarction ([place]) 10/30/2011  . PFO (patent foramen ovale) 10/30/2011  . Bradycardia 10/30/2011  . Chest pain 10/30/2011  . Hypothyroid   . Thrombophlebitis   . Sleep apnea   . Fibromyalgia   . 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 Status post bariatric surgery 12/07/2010  . Vein disorder 11/29/2010  . S/P total hysterectomy and bilateral salpingo-oophorectomy 11/29/2010  . S/P  exploratory laparotomy 11/29/2010  . S/P cholecystectomy 11/29/2010  . S/P ACL surgery 11/29/2010  . Morbid obesity [facility]) 11/10/2010    Rayetta Humphrey, PT CLT [phone] 10/08/2016, 9:05 AM  [facility] [address] [place], [place], [address] Phone: [phone]   Fax:  [phone]  Name: Debbie Pineda MRN: [id number] Date of Birth: October 10, 1960

## 2016-10-10 ENCOUNTER — Ambulatory Visit (HOSPITAL_COMMUNITY): Payer: PPO | Admitting: Physical Therapy

## 2016-10-10 DIAGNOSIS — R262 Difficulty in walking, not elsewhere classified: Secondary | ICD-10-CM

## 2016-10-10 DIAGNOSIS — M545 Low back pain, unspecified: Secondary | ICD-10-CM

## 2016-10-10 DIAGNOSIS — M25561 Pain in right knee: Secondary | ICD-10-CM

## 2016-10-10 DIAGNOSIS — R2681 Unsteadiness on feet: Secondary | ICD-10-CM

## 2016-10-10 DIAGNOSIS — R601 Generalized edema: Secondary | ICD-10-CM

## 2016-10-10 DIAGNOSIS — R296 Repeated falls: Secondary | ICD-10-CM

## 2016-10-10 NOTE — Therapy (Signed)
[place], [place], [address] Phone: [phone]   Fax:  [phone]  Physical Therapy Treatment  Patient Details  Name: Debbie Pineda MRN: [id number] Date of Birth: [birth year] Referring Provider: [name]  Encounter Date: 10/10/2016      PT End of Session - 10/10/16 1154    Visit Number 5   Number of Visits 8   Date for PT Re-Evaluation 10/27/16   Authorization - Visit Number 5   Authorization - Number of Visits 8   PT Start Time 0623   PT Stop Time 1130   PT Time Calculation (min) 50 min   Activity Tolerance Patient tolerated treatment well   Behavior During Therapy [facility] for tasks assessed/performed      Past Medical History:  Diagnosis Date  . ADD (attention deficit disorder)    takes Adderall daily  . Arthritis    "all over"  . Cerebral infarction ([place]) 10/30/2011  . Cerebrovascular disease 08/14/2016  . Chronic back pain    "all over"  . Chronic low back pain 01/11/2016  . Complication of anesthesia    tends to have hypotension when NPO and post-anesthesia  . Constipation    takes stool softener daily  . Degenerative disk disease   . Degenerative joint disease   . Depression    takes Cymbalta daily for pain per pt  . DVT (deep venous thrombosis) (HCC)    RLE  . Family history of adverse reaction to anesthesia    a family member woke up during surgery; "think it was my mom"  . Fibromyalgia   . Generalized osteoarthritis of multiple sites 11/04/2013  . GERD (gastroesophageal reflux disease)   . Heart palpitations 12/14/2013  . History of blood clots    superficial  . Hyperglycemia   . Hypothyroid    takes Synthroid daily  . Incomplete emptying of bladder   . Insomnia    takes Trazodone nightly  . Iron deficiency anemia    takes Ferrous Sulfate daily  . Joint pain   . Joint swelling    knees and ankles  . Memory disorder 08/14/2016  . Morbid obesity ([place])   . Nausea    takes Zofran as needed.Seeing GI doc  . Neck pain 05/25/2016  . OSA on CPAP    tested more than 5 yrs ago.    . Osteoarthritis   . PFO (patent foramen ovale)   . Primary osteoarthritis of both feet 05/29/2016   Right bunionectomy August 2017 by Dr. Sharol Given  . Scoliosis   . Sleep apnea   . Stroke [facility]) "several"   right foot weakness; memory issues, black spot right visual field since" (03/23/2015)  . Thrombophlebitis   . Trochanteric bursitis of both hips 05/25/2016  . Unilateral primary osteoarthritis, right knee 05/29/2016  . Urinary urgency 04/19/2016  . Vein disorder 11/29/2010    Past Surgical History:  Procedure Laterality Date  . BUNIONECTOMY Right 08/2015  . CARDIAC CATHETERIZATION     2008.  "it was fine" (not sure why she had it done, and doesn't know where)  . COLONOSCOPY N/A 03/25/2013   Procedure: COLONOSCOPY;  Surgeon: Rogene Houston, MD;  [facility];  Service: Endoscopy;  Laterality: N/A;  930  . ESOPHAGOGASTRODUODENOSCOPY    . EXPLORATORY LAPAROTOMY     "took fallopian tubes out"  . JOINT REPLACEMENT    . KNEE ARTHROSCOPY Left   . KNEE ARTHROSCOPY W/ ACL RECONSTRUCTION Right    "  added pins"  . LAPAROSCOPIC CHOLECYSTECTOMY  ~ 2001  . 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 TOTAL KNEE ARTHROPLASTY Left 03/23/2015   Procedure: TOTAL KNEE ARTHROPLASTY;  Surgeon: Newt Minion, MD;  Location: [place];  Service: Orthopedics;  Laterality: Left;  . TOTAL KNEE ARTHROPLASTY Right 08/15/2016   Procedure: RIGHT TOTAL KNEE ARTHROPLASTY, REMOVAL ACL SCREWS;  Surgeon: Newt Minion, MD;  Location: [place];  Service: Orthopedics;  Laterality: Right;  . TOTAL KNEE ARTHROPLASTY WITH HARDWARE REMOVAL Right   . VAGINAL HYSTERECTOMY    . VARICOSE VEIN SURGERY Right X 2    There were no vitals filed for this visit.      Subjective Assessment - 10/10/16 1157    Subjective Pt states her pain is still constant in her Rt knee at 5/10 and lumbar 2/10.    Currently in Pain? Yes   Pain  Score 5    Pain Location Knee   Pain Orientation Right   Pain Descriptors / Indicators Aching   Pain Type Acute pain                         [facility] Adult PT Treatment/Exercise - 10/10/16 0001      Knee/Hip Exercises: Stretches   Active Hamstring Stretch Right;3 reps;30 seconds   Active Hamstring Stretch Limitations standing   Knee: Self-Stretch to increase Flexion Right;3 reps;30 seconds   Other Knee/Hip Stretches slant board x 3   Other Knee/Hip Stretches back extension and sidebend x 3     Knee/Hip Exercises: Standing   Forward Lunges Both;10 reps   Forward Lunges Limitations onto BOSU without UE assist   Lateral Step Up Right;15 reps;Step Height: 4";Hand Hold: 1   Lateral Step Up Limitations 4"    Forward Step Up Right;15 reps;Step Height: 6";Hand Hold: 1   Forward Step Up Limitations 6"   Functional Squat 15 reps   SLS 4x   SLS with Vectors 5X5" each with 1 HHA     Knee/Hip Exercises: Seated   Sit to Sand 5 reps     Manual Therapy   Manual Therapy Edema management   Manual therapy comments done seperate from all other aspects of treatment   Edema Management efflurage/pettrisage                   PT Short Term Goals - 10/08/16 0855      PT SHORT TERM GOAL #1   Title Pt pain in her Rt knee to be no greater than a 6/10 to allow pt to stand/ambulate for 30 minutes without increased discomfort to complete household cleaning activities.l    Time 2   Period Weeks   Status On-going     PT SHORT TERM GOAL #2   Title Pt to be able to sit for 30 minutes without experiencing increased Rt knee pain.    Time 2   Period Weeks   Status On-going     PT SHORT TERM GOAL #3   Title Pt to be able to single leg stance on both LE for 15 seconds to allow pt to feel confident walking without a cane    Time 2   Period Weeks   Status On-going           PT Long Term Goals - 10/08/16 0856      PT LONG TERM GOAL #1   Title Pt pain in her right knee to  be no greater than a  4/10 to allow her to be up standing/walking for an hour to be able to go shopping    Time 4   Period Weeks   Status On-going     PT LONG TERM GOAL #2   Title Pt to be able to sit in comfort with her right knee for an hour and a half for traveling    Time 4   Period Weeks   Status On-going     PT LONG TERM GOAL #3   Title Pt to be able to ascend and descend 4 steps in a reciprocal manner without pulling up with her hands to get into and out of her home with increased ease.    Time 4   Period Weeks   Status On-going     PT LONG TERM GOAL #4   Title Pt to be able to single leg stance for 25 seconds on both LE to be confident walking on uneven ground without an assistive device.   Time 4   Period Weeks   Status On-going               Plan - 10/10/16 1154    Clinical Impression Statement No changes in pain for her back or Rt knee.  Pt requesst trial of SLS first today wtih inability to balance greater than 6" on either LE.  Added vector stance to help increase stabilizers as well as forward lunge onto BOSU.  Pt forgot compression stocking this session and requested retro massage to decrease edema/pain Rt LE at end of session.  Pt with very little edema noted and no induration in RT knee.  Pt reported no change in pain at end of session.   Rehab Potential Good   PT Frequency 2x / week   PT Duration 4 weeks   PT Treatment/Interventions ADLs/Self Care Home Management;Gait training;Stair training;Functional mobility training;Therapeutic activities;Therapeutic exercise;Balance training;Patient/family education;Manual techniques   PT Next Visit Plan Don compression garment next visit;(pt is suppose bring garment in with her).  discontinue manual and focus on balance.  Re-evaluate in 3 more sessions.   PT Home Exercise Plan bridge, terminal extension, prone hip extension.       Patient will benefit from skilled therapeutic intervention in order to improve the  following deficits and impairments:  Abnormal gait, Decreased activity tolerance, Decreased balance, Decreased endurance, Decreased range of motion, Decreased strength, Difficulty walking, Pain  Visit Diagnosis: Acute pain of right knee  Difficulty in walking, not elsewhere classified  Generalized edema  Unsteadiness on feet  Repeated falls  Acute bilateral low back pain without sciatica     Problem List Patient Active Problem List   Diagnosis Date Noted  . Presence of right artificial knee joint 09/17/2016  . History of total knee arthroplasty, right 08/15/2016  . Presence of retained hardware   . Memory disorder 08/14/2016  . Cerebrovascular disease 08/14/2016  . Unilateral primary osteoarthritis, right knee 05/29/2016  . Primary osteoarthritis of both feet 05/29/2016  . Trochanteric bursitis of both hips 05/25/2016  . Neck pain 05/25/2016  . Urinary urgency 04/19/2016  . Chronic low back pain 01/11/2016  . Depression 11/29/2015  . 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 Heart palpitations 12/14/2013  . Generalized osteoarthritis of multiple sites 11/04/2013  . Cerebral infarction ([place]) 10/30/2011  . PFO (patent foramen ovale) 10/30/2011  . Bradycardia 10/30/2011  . Chest pain 10/30/2011  . Hypothyroid   . Thrombophlebitis   . Sleep apnea   . Fibromyalgia   .  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 Status post bariatric surgery 12/07/2010  . Vein disorder 11/29/2010  . S/P total hysterectomy and bilateral salpingo-oophorectomy 11/29/2010  . S/P exploratory laparotomy 11/29/2010  . S/P cholecystectomy 11/29/2010  . S/P ACL surgery 11/29/2010  . Morbid obesity ([place]) 11/10/2010   Teena Irani, PTA/CLT [phone]  Teena Irani 10/10/2016, 12:00 PM  [place], [place], [address] Phone: [phone]   Fax:  [phone]  Name: GERARDO TERRITO MRN: [id number] Date of  Birth: 1960-12-24

## 2016-10-15 ENCOUNTER — Ambulatory Visit (INDEPENDENT_AMBULATORY_CARE_PROVIDER_SITE_OTHER): Payer: PPO | Admitting: Orthopedic Surgery

## 2016-10-15 DIAGNOSIS — Z96651 Presence of right artificial knee joint: Secondary | ICD-10-CM

## 2016-10-15 NOTE — Progress Notes (Signed)
Office Visit Note   Patient: Debbie Pineda           Date of Birth: Mar 15, 1960           MRN: [id number] Visit Date: 10/15/2016              Requested by: Jake Samples, PA-C [address] [place], [place] [address] PCP: Jake Samples, PA-C  Chief Complaint  Patient presents with  . Right Knee - Routine Post Op      HPI: Patient is a 56 year old woman who is 2 months status post a right total knee arthroplasty she  states she is doing well previously she was taking fentanyl pain patches as well as oral medication.  Assessment & Plan: Visit Diagnoses:  1. H/O total knee replacement, right     Plan: Recommended continue exercise recommended close chain kinetic exercises.  Follow-Up Instructions: Return if symptoms worsen or fail to improve.   Ortho Exam  Patient is alert, oriented, no adenopathy, well-dressed, normal affect, normal respiratory effort. Examination patient has full active extension and flexion. Collateral ligaments are stable there is no effusion there is no cellulitis patient does have quadriceps atrophy.  Imaging: No results found. No images are attached to the encounter.  Labs: Lab Results  Component Value Date   HGBA1C 5.4 10/31/2011    Orders:  No orders of the defined types were placed in this encounter.  No orders of the defined types were placed in this encounter.    Procedures: No procedures performed  Clinical Data: No additional findings.  ROS:  All other systems negative, except as noted in the HPI. Review of Systems  Objective: Vital Signs: There were no vitals taken for this visit.  Specialty Comments:  No specialty comments available.  PMFS History: Patient Active Problem List   Diagnosis Date Noted  . Presence of right artificial knee joint 09/17/2016  . H/O total knee replacement, right 08/15/2016  . Presence of retained hardware   . Memory disorder 08/14/2016  . Cerebrovascular disease  08/14/2016  . Unilateral primary osteoarthritis, right knee 05/29/2016  . Primary osteoarthritis of both feet 05/29/2016  . Trochanteric bursitis of both hips 05/25/2016  . Neck pain 05/25/2016  . Urinary urgency 04/19/2016  . Chronic low back pain 01/11/2016  . Depression 11/29/2015  . 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 Heart palpitations 12/14/2013  . Generalized osteoarthritis of multiple sites 11/04/2013  . Cerebral infarction ([place]) 10/30/2011  . PFO (patent foramen ovale) 10/30/2011  . Bradycardia 10/30/2011  . Chest pain 10/30/2011  . Hypothyroid   . Thrombophlebitis   . Sleep apnea   . Fibromyalgia   . 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 Status post bariatric surgery 12/07/2010  . Vein disorder 11/29/2010  . S/P total hysterectomy and bilateral salpingo-oophorectomy 11/29/2010  . S/P exploratory laparotomy 11/29/2010  . S/P cholecystectomy 11/29/2010  . S/P ACL surgery 11/29/2010  . Morbid obesity ([place]) 11/10/2010   Past Medical History:  Diagnosis Date  . ADD (attention deficit disorder)    takes Adderall daily  . Arthritis    "all over"  . Cerebral infarction ([place]) 10/30/2011  . Cerebrovascular disease 08/14/2016  . Chronic back pain    "all over"  . Chronic low back pain 01/11/2016  . Complication of anesthesia    tends to have hypotension when NPO and post-anesthesia  . Constipation    takes stool softener daily  . Degenerative disk disease   . Degenerative  joint disease   . Depression    takes Cymbalta daily for pain per pt  . DVT (deep venous thrombosis) (HCC)    RLE  . Family history of adverse reaction to anesthesia    a family member woke up during surgery; "think it was my mom"  . Fibromyalgia   . Generalized osteoarthritis of multiple sites 11/04/2013  . GERD (gastroesophageal reflux disease)   . Heart palpitations 12/14/2013  . History of blood clots    superficial  . Hyperglycemia   . Hypothyroid    takes Synthroid  daily  . Incomplete emptying of bladder   . Insomnia    takes Trazodone nightly  . Iron deficiency anemia    takes Ferrous Sulfate daily  . Joint pain   . Joint swelling    knees and ankles  . Memory disorder 08/14/2016  . Morbid obesity ([place])   . Nausea    takes Zofran as needed.Seeing GI doc  . Neck pain 05/25/2016  . OSA on CPAP    tested more than 5 yrs ago.    . Osteoarthritis   . PFO (patent foramen ovale)   . Primary osteoarthritis of both feet 05/29/2016   Right bunionectomy August 2017 by Dr. Sharol Given  . Scoliosis   . Sleep apnea   . Stroke [facility]) "several"   right foot weakness; memory issues, black spot right visual field since" (03/23/2015)  . Thrombophlebitis   . Trochanteric bursitis of both hips 05/25/2016  . Unilateral primary osteoarthritis, right knee 05/29/2016  . Urinary urgency 04/19/2016  . Vein disorder 11/29/2010    Family History  Problem Relation Age of Onset  . Heart disease Father   . Cancer Father   . Cancer Mother   . Heart disease Brother   . Cancer Brother   . Heart disease Sister   . Cancer Maternal Grandfather   . Colon cancer Neg Hx     Past Surgical History:  Procedure Laterality Date  . BUNIONECTOMY Right 08/2015  . CARDIAC CATHETERIZATION     2008.  "it was fine" (not sure why she had it done, and doesn't know where)  . COLONOSCOPY N/A 03/25/2013   Procedure: COLONOSCOPY;  Surgeon: Rogene Houston, MD;  [facility];  Service: Endoscopy;  Laterality: N/A;  930  . ESOPHAGOGASTRODUODENOSCOPY    . EXPLORATORY LAPAROTOMY     "took fallopian tubes out"  . JOINT REPLACEMENT    . KNEE ARTHROSCOPY Left   . KNEE ARTHROSCOPY W/ ACL RECONSTRUCTION Right    "added pins"  . LAPAROSCOPIC CHOLECYSTECTOMY  ~ 2001  . 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 TOTAL KNEE ARTHROPLASTY Left 03/23/2015   Procedure: TOTAL KNEE ARTHROPLASTY;  Surgeon: Newt Minion, MD;  Location: [facility];  Service: Orthopedics;  Laterality: Left;  . TOTAL KNEE ARTHROPLASTY  Right 08/15/2016   Procedure: RIGHT TOTAL KNEE ARTHROPLASTY, REMOVAL ACL SCREWS;  Surgeon: Newt Minion, MD;  Location: [facility];  Service: Orthopedics;  Laterality: Right;  . TOTAL KNEE ARTHROPLASTY WITH HARDWARE REMOVAL Right   . VAGINAL HYSTERECTOMY    . VARICOSE VEIN SURGERY Right X 2   Social History   Occupational History  . Disability   . formerly Therapist, sports, [place] History Main Topics  . Smoking status: Former Smoker    Packs/day: 0.75    Years: 8.00    Types: Cigarettes    Quit date: 12/01/1990  . Smokeless tobacco: Never Used  Comment: quit smoking in the 1990s  . Alcohol use No     Comment: 03/23/2015 "stopped drinking in 2012 w/gastric bypass; drank socially before bypass"  . Drug use: No  . Sexual activity: Not Currently    Birth control/ protection: Surgical

## 2016-10-17 ENCOUNTER — Ambulatory Visit (HOSPITAL_COMMUNITY): Payer: PPO | Admitting: Physical Therapy

## 2016-10-17 DIAGNOSIS — M25561 Pain in right knee: Secondary | ICD-10-CM | POA: Diagnosis not present

## 2016-10-17 DIAGNOSIS — R601 Generalized edema: Secondary | ICD-10-CM

## 2016-10-17 DIAGNOSIS — R262 Difficulty in walking, not elsewhere classified: Secondary | ICD-10-CM

## 2016-10-17 NOTE — Therapy (Signed)
[facility] [address] [place], [place], [address] Phone: [phone]   Fax:  [phone]  Physical Therapy Treatment  Patient Details  Name: Debbie Pineda MRN: [id number] Date of Birth: 1960-03-24 Referring Provider: [name]  Encounter Date: 10/17/2016      PT End of Session - 10/17/16 1340    Visit Number 6   Number of Visits 8   Date for PT Re-Evaluation 10/27/16   Authorization - Visit Number 6   Authorization - Number of Visits 8   PT Start Time 9675   PT Stop Time 1344   PT Time Calculation (min) 40 min   Activity Tolerance Patient tolerated treatment well   Behavior During Therapy [facility] for tasks assessed/performed      Past Medical History:  Diagnosis Date  . ADD (attention deficit disorder)    takes Adderall daily  . Arthritis    "all over"  . Cerebral infarction ([place]) 10/30/2011  . Cerebrovascular disease 08/14/2016  . Chronic back pain    "all over"  . Chronic low back pain 01/11/2016  . Complication of anesthesia    tends to have hypotension when NPO and post-anesthesia  . Constipation    takes stool softener daily  . Degenerative disk disease   . Degenerative joint disease   . Depression    takes Cymbalta daily for pain per pt  . DVT (deep venous thrombosis) (HCC)    RLE  . Family history of adverse reaction to anesthesia    a family member woke up during surgery; "think it was my mom"  . Fibromyalgia   . Generalized osteoarthritis of multiple sites 11/04/2013  . GERD (gastroesophageal reflux disease)   . Heart palpitations 12/14/2013  . History of blood clots    superficial  . Hyperglycemia   . Hypothyroid    takes Synthroid daily  . Incomplete emptying of bladder   . Insomnia    takes Trazodone nightly  . Iron deficiency anemia    takes Ferrous Sulfate daily  . Joint pain   . Joint swelling    knees and ankles  . Memory disorder 08/14/2016  . Morbid obesity ([place])   . Nausea     takes Zofran as needed.Seeing GI doc  . Neck pain 05/25/2016  . OSA on CPAP    tested more than 5 yrs ago.    . Osteoarthritis   . PFO (patent foramen ovale)   . Primary osteoarthritis of both feet 05/29/2016   Right bunionectomy August 2017 by Dr. Sharol Given  . Scoliosis   . Sleep apnea   . Stroke [facility]) "several"   right foot weakness; memory issues, black spot right visual field since" (03/23/2015)  . Thrombophlebitis   . Trochanteric bursitis of both hips 05/25/2016  . Unilateral primary osteoarthritis, right knee 05/29/2016  . Urinary urgency 04/19/2016  . Vein disorder 11/29/2010    Past Surgical History:  Procedure Laterality Date  . BUNIONECTOMY Right 08/2015  . CARDIAC CATHETERIZATION     2008.  "it was fine" (not sure why she had it done, and doesn't know where)  . COLONOSCOPY N/A 03/25/2013   Procedure: COLONOSCOPY;  Surgeon: Rogene Houston, MD;  [facility];  Service: Endoscopy;  Laterality: N/A;  930  . ESOPHAGOGASTRODUODENOSCOPY    . EXPLORATORY LAPAROTOMY     "took fallopian tubes out"  . JOINT REPLACEMENT    . KNEE ARTHROSCOPY Left   . KNEE ARTHROSCOPY W/ ACL RECONSTRUCTION Right    "  added pins"  . LAPAROSCOPIC CHOLECYSTECTOMY  ~ 2001  . 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 TOTAL KNEE ARTHROPLASTY Left 03/23/2015   Procedure: TOTAL KNEE ARTHROPLASTY;  Surgeon: Newt Minion, MD;  Location: [facility];  Service: Orthopedics;  Laterality: Left;  . TOTAL KNEE ARTHROPLASTY Right 08/15/2016   Procedure: RIGHT TOTAL KNEE ARTHROPLASTY, REMOVAL ACL SCREWS;  Surgeon: Newt Minion, MD;  Location: [facility];  Service: Orthopedics;  Laterality: Right;  . TOTAL KNEE ARTHROPLASTY WITH HARDWARE REMOVAL Right   . VAGINAL HYSTERECTOMY    . VARICOSE VEIN SURGERY Right X 2    There were no vitals filed for this visit.      Subjective Assessment - 10/17/16 1309    Subjective Pt states she went back to her surgeon and he has discharged her.  STates she had pain of 6/10 and took a pill  with now pain 3/10 in her knee.   Currently in Pain? Yes   Pain Score 3    Pain Orientation Right   Pain Descriptors / Indicators Aching                         [facility] Adult PT Treatment/Exercise - 10/17/16 0001      Knee/Hip Exercises: Stretches   Active Hamstring Stretch Right;3 reps;30 seconds   Active Hamstring Stretch Limitations standing   Knee: Self-Stretch to increase Flexion Right;10 seconds   Knee: Self-Stretch Limitations 10 reps   Other Knee/Hip Stretches slant board x 3x30"   Other Knee/Hip Stretches back extension and sidebend x 3     Knee/Hip Exercises: Standing   Forward Lunges Both;15 reps   Forward Lunges Limitations onto BOSU without UE assist   Lateral Step Up Right;15 reps;Step Height: 4";Hand Hold: 1   Lateral Step Up Limitations 4"    Forward Step Up Right;15 reps;Step Height: 6";Hand Hold: 1   Forward Step Up Limitations 6"   Step Down Right;10 reps;Hand Hold: 1;Step Height: 4"   SLS 4x max 10"Rt, 8" Lt   SLS with Vectors 5X5" each with 1 HHA   Other Standing Knee Exercises tandem stance 30" each on level surface      Knee/Hip Exercises: Seated   Sit to Sand 10 reps;without UE support                PT Education - 10/17/16 1734    Education provided Yes   Education Details educated on wear time of compression garments and importance of wearing them with exercise.   Person(s) Educated Patient   Methods Explanation   Comprehension Verbalized understanding          PT Short Term Goals - 10/08/16 0855      PT SHORT TERM GOAL #1   Title Pt pain in her Rt knee to be no greater than a 6/10 to allow pt to stand/ambulate for 30 minutes without increased discomfort to complete household cleaning activities.l    Time 2   Period Weeks   Status On-going     PT SHORT TERM GOAL #2   Title Pt to be able to sit for 30 minutes without experiencing increased Rt knee pain.    Time 2   Period Weeks   Status On-going     PT SHORT  TERM GOAL #3   Title Pt to be able to single leg stance on both LE for 15 seconds to allow pt to feel confident walking without a cane  Time 2   Period Weeks   Status On-going           PT Long Term Goals - 10/08/16 0856      PT LONG TERM GOAL #1   Title Pt pain in her right knee to be no greater than a 4/10 to allow her to be up standing/walking for an hour to be able to go shopping    Time 4   Period Weeks   Status On-going     PT LONG TERM GOAL #2   Title Pt to be able to sit in comfort with her right knee for an hour and a half for traveling    Time 4   Period Weeks   Status On-going     PT LONG TERM GOAL #3   Title Pt to be able to ascend and descend 4 steps in a reciprocal manner without pulling up with her hands to get into and out of her home with increased ease.    Time 4   Period Weeks   Status On-going     PT LONG TERM GOAL #4   Title Pt to be able to single leg stance for 25 seconds on both LE to be confident walking on uneven ground without an assistive device.   Time 4   Period Weeks   Status On-going               Plan - 10/17/16 1731    Clinical Impression Statement continued with therex challenging balance and overall stability of LE's.  Began forward step downs and tandem stance this session.  pt continues to have difficulty maintaining SLS balance with max of 10 seconds achieved this session.   Pt forgot her stocking this session but reports she has been wearing it.  Educated her on importance of wearing it while she is up and exercising.  Pt verbalized understanding.    Rehab Potential Good   PT Frequency 2x / week   PT Duration 4 weeks   PT Treatment/Interventions ADLs/Self Care Home Management;Gait training;Stair training;Functional mobility training;Therapeutic activities;Therapeutic exercise;Balance training;Patient/family education;Manual techniques   PT Next Visit Plan Continue focus on balance. Add foam to tandem stance and begin  tandem, retro and lateral gait.   Re-evaluate in 2 more sessions.   PT Home Exercise Plan bridge, terminal extension, prone hip extension.       Patient will benefit from skilled therapeutic intervention in order to improve the following deficits and impairments:  Abnormal gait, Decreased activity tolerance, Decreased balance, Decreased endurance, Decreased range of motion, Decreased strength, Difficulty walking, Pain  Visit Diagnosis: Acute pain of right knee  Difficulty in walking, not elsewhere classified  Generalized edema     Problem List Patient Active Problem List   Diagnosis Date Noted  . Presence of right artificial knee joint 09/17/2016  . H/O total knee replacement, right 08/15/2016  . Presence of retained hardware   . Memory disorder 08/14/2016  . Cerebrovascular disease 08/14/2016  . Unilateral primary osteoarthritis, right knee 05/29/2016  . Primary osteoarthritis of both feet 05/29/2016  . Trochanteric bursitis of both hips 05/25/2016  . Neck pain 05/25/2016  . Urinary urgency 04/19/2016  . Chronic low back pain 01/11/2016  . Depression 11/29/2015  . 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 Heart palpitations 12/14/2013  . Generalized osteoarthritis of multiple sites 11/04/2013  . Cerebral infarction ([place]) 10/30/2011  . PFO (patent foramen ovale) 10/30/2011  . Bradycardia 10/30/2011  . Chest pain 10/30/2011  .  Hypothyroid   . Thrombophlebitis   . Sleep apnea   . Fibromyalgia   . 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 Status post bariatric surgery 12/07/2010  . Vein disorder 11/29/2010  . S/P total hysterectomy and bilateral salpingo-oophorectomy 11/29/2010  . S/P exploratory laparotomy 11/29/2010  . S/P cholecystectomy 11/29/2010  . S/P ACL surgery 11/29/2010  . Morbid obesity ([place]) 11/10/2010    Roseanne Reno B 10/17/2016, 5:35 PM  [facility] [address] [place], [place],  [address] Phone: [phone]   Fax:  [phone]  Name: Debbie Pineda MRN: [id number] Date of Birth: 1960/11/28

## 2016-10-19 ENCOUNTER — Encounter (HOSPITAL_COMMUNITY): Payer: Self-pay

## 2016-10-19 ENCOUNTER — Ambulatory Visit (HOSPITAL_COMMUNITY): Payer: PPO

## 2016-10-19 DIAGNOSIS — R262 Difficulty in walking, not elsewhere classified: Secondary | ICD-10-CM

## 2016-10-19 DIAGNOSIS — R601 Generalized edema: Secondary | ICD-10-CM

## 2016-10-19 DIAGNOSIS — M25561 Pain in right knee: Secondary | ICD-10-CM

## 2016-10-19 NOTE — Therapy (Signed)
[place], [place], [address] Phone: [phone]   Fax:  [phone]  Physical Therapy Treatment  Patient Details  Name: Debbie Pineda MRN: [id number] Date of Birth: 1960-05-19 Referring Provider: [name]  Encounter Date: 10/19/2016      PT End of Session - 10/19/16 1008    Visit Number 7   Number of Visits 8   Date for PT Re-Evaluation 10/27/16   Authorization - Visit Number 7   Authorization - Number of Visits 8   PT Start Time [id number]  pt in restroom at beginning of session   PT Stop Time 1048  last 10 min with ice   PT Time Calculation (min) 55 min   Activity Tolerance Patient tolerated treatment well   Behavior During Therapy [facility] for tasks assessed/performed      Past Medical History:  Diagnosis Date  . ADD (attention deficit disorder)    takes Adderall daily  . Arthritis    "all over"  . Cerebral infarction ([place]) 10/30/2011  . Cerebrovascular disease 08/14/2016  . Chronic back pain    "all over"  . Chronic low back pain 01/11/2016  . Complication of anesthesia    tends to have hypotension when NPO and post-anesthesia  . Constipation    takes stool softener daily  . Degenerative disk disease   . Degenerative joint disease   . Depression    takes Cymbalta daily for pain per pt  . DVT (deep venous thrombosis) (HCC)    RLE  . Family history of adverse reaction to anesthesia    a family member woke up during surgery; "think it was my mom"  . Fibromyalgia   . Generalized osteoarthritis of multiple sites 11/04/2013  . GERD (gastroesophageal reflux disease)   . Heart palpitations 12/14/2013  . History of blood clots    superficial  . Hyperglycemia   . Hypothyroid    takes Synthroid daily  . Incomplete emptying of bladder   . Insomnia    takes Trazodone nightly  . Iron deficiency anemia    takes Ferrous Sulfate daily  . Joint pain   . Joint swelling    knees and ankles  . Memory  disorder 08/14/2016  . Morbid obesity ([place])   . Nausea    takes Zofran as needed.Seeing GI doc  . Neck pain 05/25/2016  . OSA on CPAP    tested more than 5 yrs ago.    . Osteoarthritis   . PFO (patent foramen ovale)   . Primary osteoarthritis of both feet 05/29/2016   Right bunionectomy August 2017 by Dr. Sharol Given  . Scoliosis   . Sleep apnea   . Stroke [facility]) "several"   right foot weakness; memory issues, black spot right visual field since" (03/23/2015)  . Thrombophlebitis   . Trochanteric bursitis of both hips 05/25/2016  . Unilateral primary osteoarthritis, right knee 05/29/2016  . Urinary urgency 04/19/2016  . Vein disorder 11/29/2010    Past Surgical History:  Procedure Laterality Date  . BUNIONECTOMY Right 08/2015  . CARDIAC CATHETERIZATION     2008.  "it was fine" (not sure why she had it done, and doesn't know where)  . COLONOSCOPY N/A 03/25/2013   Procedure: COLONOSCOPY;  Surgeon: Rogene Houston, MD;  [facility];  Service: Endoscopy;  Laterality: N/A;  930  . ESOPHAGOGASTRODUODENOSCOPY    . EXPLORATORY LAPAROTOMY     "took fallopian tubes out"  . JOINT REPLACEMENT    .  KNEE ARTHROSCOPY Left   . KNEE ARTHROSCOPY W/ ACL RECONSTRUCTION Right    "added pins"  . LAPAROSCOPIC CHOLECYSTECTOMY  ~ 2001  . 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 TOTAL KNEE ARTHROPLASTY Left 03/23/2015   Procedure: TOTAL KNEE ARTHROPLASTY;  Surgeon: Newt Minion, MD;  Location: [place];  Service: Orthopedics;  Laterality: Left;  . TOTAL KNEE ARTHROPLASTY Right 08/15/2016   Procedure: RIGHT TOTAL KNEE ARTHROPLASTY, REMOVAL ACL SCREWS;  Surgeon: Newt Minion, MD;  Location: [place];  Service: Orthopedics;  Laterality: Right;  . TOTAL KNEE ARTHROPLASTY WITH HARDWARE REMOVAL Right   . VAGINAL HYSTERECTOMY    . VARICOSE VEIN SURGERY Right X 2    There were no vitals filed for this visit.      Subjective Assessment - 10/19/16 1002    Subjective Pt reports increased pain LE with compression hose on  due to firbromyalgia, does continue to wear for edema control.  No reports of current pain in knee today, did take pain medication prior session today.  Feels her most difficulty with gait mechanics and pain posterior knee following therex.   Pertinent History CVA, DVT, Fibromyalgia, LBP; Lt  TKR    Patient Stated Goals To have less pain.     Currently in Pain? No/denies  took pain medication prior session   Pain Score 2   Pain Location Back   Pain Orientation Lower   Pain Descriptors / Indicators Sharp   Pain Type Chronic pain   Pain Onset More than a month ago   Pain Frequency Intermittent   Aggravating Factors  activity   Pain Relieving Factors injection and medication   Effect of Pain on Daily Activities increases                         [facility] Adult PT Treatment/Exercise - 10/19/16 0001      Knee/Hip Exercises: Stretches   Active Hamstring Stretch Right;3 reps;30 seconds   Active Hamstring Stretch Limitations standing on 12 in step   Other Knee/Hip Stretches slant board x 3x30"   Other Knee/Hip Stretches back extension and sidebend x 3     Knee/Hip Exercises: Standing   Lateral Step Up Both;15 reps;Hand Hold: 1;Step Height: 6"   Lateral Step Up Limitations 6"   Forward Step Up Both;15 reps;Step Height: 6"   Forward Step Up Limitations 6"   Step Down Both;15 reps;Step Height: 6";Step Height: 4"   Step Down Limitations began with 6in, compenation then reduced to 4in step heigth   SLS Rt 7" Lt 6" max of 5   SLS with Vectors 5X5" each with 1 HHA   Gait Training 500 feet with focus on heel to toe and equal stride length   Other Standing Knee Exercises tanden stance on foam 3x 30"   Other Standing Knee Exercises tandem, retro and sidestep with GTB 1RT     Modalities   Modalities Cryotherapy     Cryotherapy   Number Minutes Cryotherapy 10 Minutes   Cryotherapy Location Knee   Type of Cryotherapy Ice pack                  PT Short Term Goals -  10/08/16 0855      PT SHORT TERM GOAL #1   Title Pt pain in her Rt knee to be no greater than a 6/10 to allow pt to stand/ambulate for 30 minutes without increased discomfort to complete household cleaning activities.l  Time 2   Period Weeks   Status On-going     PT SHORT TERM GOAL #2   Title Pt to be able to sit for 30 minutes without experiencing increased Rt knee pain.    Time 2   Period Weeks   Status On-going     PT SHORT TERM GOAL #3   Title Pt to be able to single leg stance on both LE for 15 seconds to allow pt to feel confident walking without a cane    Time 2   Period Weeks   Status On-going           PT Long Term Goals - 10/08/16 0856      PT LONG TERM GOAL #1   Title Pt pain in her right knee to be no greater than a 4/10 to allow her to be up standing/walking for an hour to be able to go shopping    Time 4   Period Weeks   Status On-going     PT LONG TERM GOAL #2   Title Pt to be able to sit in comfort with her right knee for an hour and a half for traveling    Time 4   Period Weeks   Status On-going     PT LONG TERM GOAL #3   Title Pt to be able to ascend and descend 4 steps in a reciprocal manner without pulling up with her hands to get into and out of her home with increased ease.    Time 4   Period Weeks   Status On-going     PT LONG TERM GOAL #4   Title Pt to be able to single leg stance for 25 seconds on both LE to be confident walking on uneven ground without an assistive device.   Time 4   Period Weeks   Status On-going               Plan - 10/19/16 1202    Clinical Impression Statement Session focus on functional strenghtening with additional balance acitviites to improve stability of LE.  Trial with increased height wiht forward step down, pt presents with increased compensation so returned to [place] with minimal difficulty.  Began tandem stance on foam as well as tandem, retro and sidestepping gait, min guard for safety.  EOS with ice  per request.  Pt continues to c/o increased pain with compression sock, encouraged pt to continues wearing when she is up and exercising.  Pt verbalized understanding.     Rehab Potential Good   PT Frequency 2x / week   PT Duration 4 weeks   PT Treatment/Interventions ADLs/Self Care Home Management;Gait training;Stair training;Functional mobility training;Therapeutic activities;Therapeutic exercise;Balance training;Patient/family education;Manual techniques   PT Next Visit Plan Re-eval next session.   PT Home Exercise Plan bridge, terminal extension, prone hip extension.       Patient will benefit from skilled therapeutic intervention in order to improve the following deficits and impairments:  Abnormal gait, Decreased activity tolerance, Decreased balance, Decreased endurance, Decreased range of motion, Decreased strength, Difficulty walking, Pain  Visit Diagnosis: Acute pain of right knee  Difficulty in walking, not elsewhere classified  Generalized edema     Problem List Patient Active Problem List   Diagnosis Date Noted  . Presence of right artificial knee joint 09/17/2016  . H/O total knee replacement, right 08/15/2016  . Presence of retained hardware   . Memory disorder 08/14/2016  . Cerebrovascular disease 08/14/2016  . Unilateral primary  osteoarthritis, right knee 05/29/2016  . Primary osteoarthritis of both feet 05/29/2016  . Trochanteric bursitis of both hips 05/25/2016  . Neck pain 05/25/2016  . Urinary urgency 04/19/2016  . Chronic low back pain 01/11/2016  . Depression 11/29/2015  . 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 Heart palpitations 12/14/2013  . Generalized osteoarthritis of multiple sites 11/04/2013  . Cerebral infarction ([place]) 10/30/2011  . PFO (patent foramen ovale) 10/30/2011  . Bradycardia 10/30/2011  . Chest pain 10/30/2011  . Hypothyroid   . Thrombophlebitis   . Sleep apnea   . Fibromyalgia   . History of gastric bypass, 11/20/2010  12/07/2010  . Status post bariatric surgery 12/07/2010  . Vein disorder 11/29/2010  . S/P total hysterectomy and bilateral salpingo-oophorectomy 11/29/2010  . S/P exploratory laparotomy 11/29/2010  . S/P cholecystectomy 11/29/2010  . S/P ACL surgery 11/29/2010  . Morbid obesity ([place]) 11/10/2010   Ihor Austin, [place]; [place]  Aldona Lento 10/19/2016, 12:49 PM  [place] [address] [place], [place], [address] Phone: [phone]   Fax:  [phone]  Name: KARLEY PHO MRN: [id number] Date of Birth: 08-02-1960

## 2016-10-22 ENCOUNTER — Telehealth (HOSPITAL_COMMUNITY): Payer: Self-pay | Admitting: Family Medicine

## 2016-10-22 ENCOUNTER — Ambulatory Visit (HOSPITAL_COMMUNITY): Payer: PPO | Attending: Orthopedic Surgery | Admitting: Physical Therapy

## 2016-10-22 DIAGNOSIS — R601 Generalized edema: Secondary | ICD-10-CM | POA: Diagnosis not present

## 2016-10-22 DIAGNOSIS — R262 Difficulty in walking, not elsewhere classified: Secondary | ICD-10-CM | POA: Diagnosis not present

## 2016-10-22 DIAGNOSIS — M25561 Pain in right knee: Secondary | ICD-10-CM

## 2016-10-22 NOTE — Therapy (Signed)
[place] [address] [place], [place], [address] Phone: [phone]   Fax:  [phone]  Physical Therapy Treatment  Patient Details  Name: Debbie Pineda MRN: [id number] Date of Birth: July 23, 1960 Referring Provider: Meridee Score   Encounter Date: 10/22/2016      PT End of Session - 10/22/16 1017    Visit Number 8   Number of Visits 8   Date for PT Re-Evaluation 10/27/16   Authorization - Visit Number 8   Authorization - Number of Visits 8   PT Start Time [id number]   PT Stop Time 1035   PT Time Calculation (min) 43 min   Activity Tolerance Patient tolerated treatment well   Behavior During Therapy [facility] for tasks assessed/performed      Past Medical History:  Diagnosis Date  . ADD (attention deficit disorder)    takes Adderall daily  . Arthritis    "all over"  . Cerebral infarction ([place]) 10/30/2011  . Cerebrovascular disease 08/14/2016  . Chronic back pain    "all over"  . Chronic low back pain 01/11/2016  . Complication of anesthesia    tends to have hypotension when NPO and post-anesthesia  . Constipation    takes stool softener daily  . Degenerative disk disease   . Degenerative joint disease   . Depression    takes Cymbalta daily for pain per pt  . DVT (deep venous thrombosis) (HCC)    RLE  . Family history of adverse reaction to anesthesia    a family member woke up during surgery; "think it was my mom"  . Fibromyalgia   . Generalized osteoarthritis of multiple sites 11/04/2013  . GERD (gastroesophageal reflux disease)   . Heart palpitations 12/14/2013  . History of blood clots    superficial  . Hyperglycemia   . Hypothyroid    takes Synthroid daily  . Incomplete emptying of bladder   . Insomnia    takes Trazodone nightly  . Iron deficiency anemia    takes Ferrous Sulfate daily  . Joint pain   . Joint swelling    knees and ankles  . Memory disorder 08/14/2016  . Morbid obesity ([place])   . Nausea    takes  Zofran as needed.Seeing GI doc  . Neck pain 05/25/2016  . OSA on CPAP    tested more than 5 yrs ago.    . Osteoarthritis   . PFO (patent foramen ovale)   . Primary osteoarthritis of both feet 05/29/2016   Right bunionectomy August 2017 by Dr. Sharol Given  . Scoliosis   . Sleep apnea   . Stroke [facility]) "several"   right foot weakness; memory issues, black spot right visual field since" (03/23/2015)  . Thrombophlebitis   . Trochanteric bursitis of both hips 05/25/2016  . Unilateral primary osteoarthritis, right knee 05/29/2016  . Urinary urgency 04/19/2016  . Vein disorder 11/29/2010    Past Surgical History:  Procedure Laterality Date  . BUNIONECTOMY Right 08/2015  . CARDIAC CATHETERIZATION     2008.  "it was fine" (not sure why she had it done, and doesn't know where)  . COLONOSCOPY N/A 03/25/2013   Procedure: COLONOSCOPY;  Surgeon: Rogene Houston, MD;  [facility];  Service: Endoscopy;  Laterality: N/A;  930  . ESOPHAGOGASTRODUODENOSCOPY    . EXPLORATORY LAPAROTOMY     "took fallopian tubes out"  . JOINT REPLACEMENT    . KNEE ARTHROSCOPY Left   . KNEE ARTHROSCOPY W/ ACL RECONSTRUCTION  Right    "added pins"  . LAPAROSCOPIC CHOLECYSTECTOMY  ~ 2001  . 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 TOTAL KNEE ARTHROPLASTY Left 03/23/2015   Procedure: TOTAL KNEE ARTHROPLASTY;  Surgeon: Newt Minion, MD;  Location: [place];  Service: Orthopedics;  Laterality: Left;  . TOTAL KNEE ARTHROPLASTY Right 08/15/2016   Procedure: RIGHT TOTAL KNEE ARTHROPLASTY, REMOVAL ACL SCREWS;  Surgeon: Newt Minion, MD;  Location: [place];  Service: Orthopedics;  Laterality: Right;  . TOTAL KNEE ARTHROPLASTY WITH HARDWARE REMOVAL Right   . VAGINAL HYSTERECTOMY    . VARICOSE VEIN SURGERY Right X 2    There were no vitals filed for this visit.      Subjective Assessment - 10/22/16 0955    Subjective Pt states that her knee is doing well as long as she does not overdue.     Pertinent History CVA, DVT, Fibromyalgia,  LBP; Lt  TKR    Limitations Sitting;Standing;Walking;Lifting;House hold activities   How long can you sit comfortably? Pt is able to sit for 1-2  hour was 15 minutes due to her knee pain.    How long can you stand comfortably? Pt is only able to stand for 15-30 minutes was two minutes before her whole body begins to bother her.     How long can you walk comfortably? Pt is walking with a cane prior level of function is walking with a cane.  She is able to walk for 30 minutes at a time was 15 minutes at a time but this caused her increased pain.    Patient Stated Goals To have less pain.     Currently in Pain? No/denies  no pain in knee but she did take some pain medication about three hours ago    Pain Onset 1 to 4 weeks ago   Aggravating Factors  if she over does   Pain Relieving Factors ice and pain medicaton    Effect of Pain on Daily Activities increases pain    Pain Onset More than a month ago            [facility] PT Assessment - 10/22/16 0001      Assessment   Medical Diagnosis Rt TKR   Referring Provider Meridee Score    Onset Date/Surgical Date 08/15/16   Next MD Visit unknown    Prior Therapy [facility]      Precautions   Precautions None     Restrictions   Weight Bearing Restrictions No     Balance Screen   Has the patient fallen in the past 6 months Yes   How many times? 1   Has the patient had a decrease in activity level because of a fear of falling?  Yes   Is the patient reluctant to leave their home because of a fear of falling?  No     Home Environment   Living Environment Private residence   Type of [place] to enter   Entrance Stairs-Number of Steps 4  PT pulls herself up   Entrance Stairs-Rails Can reach both   Home Layout One level     Prior Function   Level of Independence Independent   Vocation On disability   Leisure taking care of her granddaughter       Cognition   Overall Cognitive Status Within Functional Limits for tasks  assessed     Observation/Other Assessments   Focus on Therapeutic Outcomes (FOTO)  50  was  38     Observation/Other Assessments-Edema    Edema Circumferential  Lt: mid joint 44; RT 42.5     Functional Tests   Functional tests Single leg stance;Sit to Stand     Single Leg Stance   Comments Rt 11 was 6 seconds; Left 10 was  3      Sit to Stand   Comments 5 x 11.26 was 20.52  with weight shifting to Left.      AROM   Right Knee Extension 0  was 2   Right Knee Flexion 126  was 123      Strength   Right Hip Flexion 5/5   Right Hip Extension 4+/5  was 4-:  Lt is 4+ as well    Right Hip ABduction 5/5   Right Knee Flexion 5/5   Right Knee Extension 5/5  was 4+/5    Left Knee Flexion 5/5  was 4+/5    Left Knee Extension 5/5   Right Ankle Dorsiflexion 5/5   Left Ankle Dorsiflexion 5/5                     [facility] Adult PT Treatment/Exercise - 10/22/16 0001      Knee/Hip Exercises: Aerobic   Nustep Level 2 x 10     Knee/Hip Exercises: Standing   Stairs 3 RT    SLS x5 each      Knee/Hip Exercises: Seated   Sit to Sand 5 reps     Knee/Hip Exercises: Supine   Quad Sets Right;5 reps     Knee/Hip Exercises: Prone   Hip Extension Both;5 reps                PT Education - 10/22/16 1209    Education provided Yes   Education Details Increase walking to increase endurance,  Sit on the corner of chairs to promote good posture,  Walk up and down steps reciprocally as she is able to do this; Go to the gym   Person(s) Educated Patient   Methods Explanation   Comprehension Verbalized understanding          PT Short Term Goals - 10/22/16 1015      PT SHORT TERM GOAL #1   Title Pt pain in her Rt knee to be no greater than a 6/10 to allow pt to stand/ambulate for 30 minutes without increased discomfort to complete household cleaning activities.l    Time 2   Period Weeks   Status Achieved     PT SHORT TERM GOAL #2   Title Pt to be able to sit for 30  minutes without experiencing increased Rt knee pain.    Time 2   Period Weeks   Status Achieved     PT SHORT TERM GOAL #3   Title Pt to be able to single leg stance on both LE for 15 seconds to allow pt to feel confident walking without a cane    Baseline 10/22/2016:  able to stand for 10 seconds    Time 2   Period Weeks   Status Not Met           PT Long Term Goals - 10/22/16 1016      PT LONG TERM GOAL #1   Title Pt pain in her right knee to be no greater than a 4/10 to allow her to be up standing/walking for an hour to be able to go shopping    Time 4   Period Weeks  Status Achieved     PT LONG TERM GOAL #2   Title Pt to be able to sit in comfort with her right knee for an hour and a half for traveling    Time 4   Period Weeks   Status Achieved     PT LONG TERM GOAL #3   Title Pt to be able to ascend and descend 4 steps in a reciprocal manner without pulling up with her hands to get into and out of her home with increased ease.    Time 4   Period Weeks   Status Achieved     PT LONG TERM GOAL #4   Title Pt to be able to single leg stance for 25 seconds on both LE to be confident walking on uneven ground without an assistive device.   Time 4   Period Weeks   Status Not Met               Plan - 2016-11-11 1028    Clinical Impression Statement Pt reassessed with all goals except her balance being met.  Pt has had balance issues ever since her stroke.  Pt is ready for discharge.    Rehab Potential Good   PT Frequency 2x / week   PT Duration 4 weeks   PT Treatment/Interventions ADLs/Self Care Home Management;Gait training;Stair training;Functional mobility training;Therapeutic activities;Therapeutic exercise;Balance training;Patient/family education;Manual techniques   PT Next Visit Plan discharge pt to home exercise program.    PT Home Exercise Plan bridge, terminal extension, prone hip extension.       Patient will benefit from skilled therapeutic  intervention in order to improve the following deficits and impairments:  Abnormal gait, Decreased activity tolerance, Decreased balance, Decreased endurance, Decreased range of motion, Decreased strength, Difficulty walking, Pain  Visit Diagnosis: Acute pain of right knee  Difficulty in walking, not elsewhere classified  Generalized edema       G-Codes - 11/11/2016 1211    Functional Limitation Changing and maintaining body position   Changing and Maintaining Body Position Goal Status (C1638) At least 40 percent but less than 60 percent impaired, limited or restricted   Changing and Maintaining Body Position Discharge Status (G5364) At least 40 percent but less than 60 percent impaired, limited or restricted      Problem List Patient Active Problem List   Diagnosis Date Noted  . Presence of right artificial knee joint 09/17/2016  . H/O total knee replacement, right 08/15/2016  . Presence of retained hardware   . Memory disorder 08/14/2016  . Cerebrovascular disease 08/14/2016  . Unilateral primary osteoarthritis, right knee 05/29/2016  . Primary osteoarthritis of both feet 05/29/2016  . Trochanteric bursitis of both hips 05/25/2016  . Neck pain 05/25/2016  . Urinary urgency 04/19/2016  . Chronic low back pain 01/11/2016  . Depression 11/29/2015  . 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 Heart palpitations 12/14/2013  . Generalized osteoarthritis of multiple sites 11/04/2013  . Cerebral infarction ([place]) 10/30/2011  . PFO (patent foramen ovale) 10/30/2011  . Bradycardia 10/30/2011  . Chest pain 10/30/2011  . Hypothyroid   . Thrombophlebitis   . Sleep apnea   . Fibromyalgia   . 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 Status post bariatric surgery 12/07/2010  . Vein disorder 11/29/2010  . S/P total hysterectomy and bilateral salpingo-oophorectomy 11/29/2010  . S/P exploratory laparotomy 11/29/2010  . S/P cholecystectomy 11/29/2010  . S/P ACL surgery  11/29/2010  . Morbid obesity ([place]) 11/10/2010  Rayetta Humphrey, PT CLT [phone] 10/22/2016, 12:12 PM  [place], [place], [address] Phone: [phone]   Fax:  [phone]  Name: Debbie Pineda MRN: [id number] Date of Birth: [birth year]  PHYSICAL THERAPY DISCHARGE SUMMARY  Visits from Start of Care: 8  Current functional level related to goals / functional outcomes: As above Remaining deficits: As above   Education / Equipment: HEP Plan: Patient agrees to discharge.  Patient goals were met. Patient is being discharged due to being pleased with the current functional level.  ?????        Rayetta Humphrey, [place] CLT [phone]

## 2016-10-22 NOTE — Telephone Encounter (Signed)
10/22/16  As patient was leaving appointment she stopped by the front desk and said that she was going to wait a little bit before having the back evaluation

## 2016-10-24 DIAGNOSIS — G4733 Obstructive sleep apnea (adult) (pediatric): Secondary | ICD-10-CM | POA: Diagnosis not present

## 2016-10-25 ENCOUNTER — Encounter (HOSPITAL_COMMUNITY): Payer: PPO | Admitting: Physical Therapy

## 2016-10-26 DIAGNOSIS — R300 Dysuria: Secondary | ICD-10-CM | POA: Diagnosis not present

## 2016-11-06 DIAGNOSIS — Z6834 Body mass index (BMI) 34.0-34.9, adult: Secondary | ICD-10-CM | POA: Diagnosis not present

## 2016-11-06 DIAGNOSIS — Z01419 Encounter for gynecological examination (general) (routine) without abnormal findings: Secondary | ICD-10-CM | POA: Diagnosis not present

## 2016-11-06 DIAGNOSIS — Z1231 Encounter for screening mammogram for malignant neoplasm of breast: Secondary | ICD-10-CM | POA: Diagnosis not present

## 2016-11-06 DIAGNOSIS — R102 Pelvic and perineal pain: Secondary | ICD-10-CM | POA: Diagnosis not present

## 2016-11-06 DIAGNOSIS — N3945 Continuous leakage: Secondary | ICD-10-CM | POA: Diagnosis not present

## 2016-11-08 DIAGNOSIS — Z8042 Family history of malignant neoplasm of prostate: Secondary | ICD-10-CM | POA: Diagnosis not present

## 2016-11-08 DIAGNOSIS — Z803 Family history of malignant neoplasm of breast: Secondary | ICD-10-CM | POA: Diagnosis not present

## 2016-11-08 DIAGNOSIS — Z8052 Family history of malignant neoplasm of bladder: Secondary | ICD-10-CM | POA: Diagnosis not present

## 2016-11-08 DIAGNOSIS — Z800 Family history of malignant neoplasm of digestive organs: Secondary | ICD-10-CM | POA: Diagnosis not present

## 2016-11-08 DIAGNOSIS — Z8051 Family history of malignant neoplasm of kidney: Secondary | ICD-10-CM | POA: Diagnosis not present

## 2016-11-08 DIAGNOSIS — Z808 Family history of malignant neoplasm of other organs or systems: Secondary | ICD-10-CM | POA: Diagnosis not present

## 2016-11-12 ENCOUNTER — Encounter: Payer: Self-pay | Admitting: Vascular Surgery

## 2016-11-12 ENCOUNTER — Encounter (HOSPITAL_COMMUNITY): Payer: Self-pay

## 2016-11-19 NOTE — Progress Notes (Addendum)
Office Visit Note  Patient: Debbie Pineda             Date of Birth: [birth year]           MRN: [id number]             PCP: Jake Samples, PA-C Referring: Jake Samples, [place]* Visit Date: 11/30/2016 Occupation: @GUAROCC @    Subjective:  Increased muscle and joint pain   History of Present Illness: Debbie Pineda is a 56 y.o. female with history of fibromyalgia and osteoarthritis. She states she's having a fibromyalgia flare. She's been experiencing increased joint pain and increased muscle pain. She also believes she is having fiber follow-up.She complains of bilateral trapezius and shoulder pain. She also has discomfort in her lower back, bilateral hips, bilateral knee joints and her ankle joints. She also has discomfort on dorsum of her bilateral feet.  Activities of Daily Living:  Patient reports morning stiffness for 3 hours.   Patient Reports nocturnal pain.  Difficulty dressing/grooming: Denies Difficulty climbing stairs: Reports Difficulty getting out of chair: Reports Difficulty using hands for taps, buttons, cutlery, and/or writing: Reports   Review of Systems  Constitutional: Positive for fatigue. Negative for night sweats, weight gain, weight loss and weakness.  HENT: Positive for mouth dryness. Negative for mouth sores, trouble swallowing, trouble swallowing and nose dryness.   Eyes: Positive for redness. Negative for pain, visual disturbance and dryness.  Respiratory: Negative for cough, shortness of breath and difficulty breathing.   Cardiovascular: Negative for chest pain, palpitations, hypertension, irregular heartbeat and swelling in legs/feet.  Gastrointestinal: Positive for constipation. Negative for blood in stool and diarrhea.  Endocrine: Negative for increased urination.  Genitourinary: Negative for vaginal dryness.  Musculoskeletal: Positive for arthralgias, joint pain, myalgias, morning stiffness and myalgias. Negative for joint swelling,  muscle weakness and muscle tenderness.  Skin: Negative for color change, rash, hair loss, skin tightness, ulcers and sensitivity to sunlight.  Allergic/Immunologic: Negative for susceptible to infections.  Neurological: Negative for dizziness, memory loss and night sweats.  Hematological: Negative for swollen glands.  Psychiatric/Behavioral: Positive for sleep disturbance. Negative for depressed mood. The patient is not nervous/anxious.     PMFS History:  Patient Active Problem List   Diagnosis Date Noted  . Presence of right artificial knee joint 09/17/2016  . H/O total knee replacement, right 08/15/2016  . Presence of retained hardware   . Memory disorder 08/14/2016  . Cerebrovascular disease 08/14/2016  . Unilateral primary osteoarthritis, right knee 05/29/2016  . Primary osteoarthritis of both feet 05/29/2016  . Trochanteric bursitis of both hips 05/25/2016  . Neck pain 05/25/2016  . Urinary urgency 04/19/2016  . Chronic low back pain 01/11/2016  . Depression 11/29/2015  . 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 Heart palpitations 12/14/2013  . Generalized osteoarthritis of multiple sites 11/04/2013  . Cerebral infarction ([place]) 10/30/2011  . PFO (patent foramen ovale) 10/30/2011  . Bradycardia 10/30/2011  . Chest pain 10/30/2011  . Hypothyroid   . Thrombophlebitis   . Sleep apnea   . Fibromyalgia   . 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 Status post bariatric surgery 12/07/2010  . Vein disorder 11/29/2010  . S/P total hysterectomy and bilateral salpingo-oophorectomy 11/29/2010  . S/P exploratory laparotomy 11/29/2010  . S/P cholecystectomy 11/29/2010  . S/P ACL surgery 11/29/2010  . Morbid obesity ([place]) 11/10/2010    Past Medical History:  Diagnosis Date  . ADD (attention deficit disorder)    takes Adderall daily  .  Arthritis    "all over"  . Cerebral infarction ([place]) 10/30/2011  . Cerebrovascular disease 08/14/2016  . Chronic back pain    "all  over"  . Chronic low back pain 01/11/2016  . Complication of anesthesia    tends to have hypotension when NPO and post-anesthesia  . Constipation    takes stool softener daily  . Degenerative disk disease   . Degenerative joint disease   . Depression    takes Cymbalta daily for pain per pt  . DVT (deep venous thrombosis) (HCC)    RLE  . Family history of adverse reaction to anesthesia    a family member woke up during surgery; "think it was my mom"  . Fibromyalgia   . Generalized osteoarthritis of multiple sites 11/04/2013  . GERD (gastroesophageal reflux disease)   . Heart palpitations 12/14/2013  . History of blood clots    superficial  . Hypoglycemia   . Hypothyroid    takes Synthroid daily  . Incomplete emptying of bladder   . Insomnia    takes Trazodone nightly  . Iron deficiency anemia    takes Ferrous Sulfate daily  . Joint pain   . Joint swelling    knees and ankles  . Memory disorder 08/14/2016  . Morbid obesity ([place])   . Nausea    takes Zofran as needed.Seeing GI doc  . Neck pain 05/25/2016  . OSA on CPAP    tested more than 5 yrs ago.    . Osteoarthritis   . PFO (patent foramen ovale)   . Primary osteoarthritis of both feet 05/29/2016   Right bunionectomy August 2017 by Dr. Sharol Given  . Scoliosis   . Sleep apnea   . Stroke [facility]) "several"   right foot weakness; memory issues, black spot right visual field since" (03/23/2015)  . Thrombophlebitis   . Trochanteric bursitis of both hips 05/25/2016  . Unilateral primary osteoarthritis, right knee 05/29/2016  . Urinary urgency 04/19/2016  . Vein disorder 11/29/2010    Family History  Problem Relation Age of Onset  . Heart disease Father   . Cancer Father   . Cancer Mother        skin cancer   . Heart disease Brother   . Cancer Brother   . Diabetes Brother   . Stroke Brother   . Heart disease Sister   . Cancer Maternal Grandfather   . Hypothyroidism Daughter   . Colon cancer Neg Hx    Past Surgical History:    Procedure Laterality Date  . BUNIONECTOMY Right 08/2015  . CARDIAC CATHETERIZATION     2008.  "it was fine" (not sure why she had it done, and doesn't know where)  . ESOPHAGOGASTRODUODENOSCOPY    . EXPLORATORY LAPAROTOMY     "took fallopian tubes out"  . JOINT REPLACEMENT     bil knee   . KNEE ARTHROPLASTY    . KNEE ARTHROSCOPY Left   . KNEE ARTHROSCOPY W/ ACL RECONSTRUCTION Right    "added pins"  . LAPAROSCOPIC CHOLECYSTECTOMY  ~ 2001  . 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 TOTAL KNEE ARTHROPLASTY WITH HARDWARE REMOVAL Right   . VAGINAL HYSTERECTOMY    . VARICOSE VEIN SURGERY Right X 2   Social History   Social History Narrative   Lives with husband   Caffeine use: No soda   Mainly water, drinks decaf tea   Right handed     Objective: Vital Signs: BP 124/75 (BP Location: Left Arm, Patient Position: Sitting,  Cuff Size: Normal)   Pulse 82   Resp 16   Ht 5\' 5"  (1.651 m)   Wt 211 lb (95.7 kg)   BMI 35.11 kg/m    Physical Exam  Constitutional: She is oriented to person, place, and time. She appears well-developed and well-nourished.  HENT:  Head: Normocephalic and atraumatic.  Eyes: Conjunctivae and EOM are normal.  Neck: Normal range of motion.  Cardiovascular: Normal rate, regular rhythm, normal heart sounds and intact distal pulses.  Pulmonary/Chest: Effort normal and breath sounds normal.  Abdominal: Soft. Bowel sounds are normal.  Lymphadenopathy:    She has no cervical adenopathy.  Neurological: She is alert and oriented to person, place, and time.  Skin: Skin is warm and dry. Capillary refill takes less than 2 seconds.  Psychiatric: She has a normal mood and affect. Her behavior is normal.  Nursing note and vitals reviewed.    Musculoskeletal Exam: C-spine and thoracic lumbar spine some discomfort range of motion. Shoulder joints although joints wrist joints MCPs PIPs DIPs with good range of motion with no synovitis. She has bilateral total knee  replacement. She has some warmth in the right knee joint.  No effusion was noted. She also has discomfort in bilateral trapezius area and bilateral SI joint area.  CDAI Exam: No CDAI exam completed.    Investigation: No additional findings.   Imaging: No results found.  Speciality Comments: No specialty comments available.    Procedures:  Sacroiliac Joint Inj on 11/30/2016 11:55 AM Indications: pain Details: 27 G 1.5 in needle, posterior approach Medications (Right): 1 mL lidocaine 1 %; 40 mg triamcinolone acetonide 40 MG/ML Aspirate (Right): 0 mL Medications (Left): 1 mL lidocaine 1 %; 40 mg triamcinolone acetonide 40 MG/ML Aspirate (Left): 0 mL Outcome: tolerated well, no immediate complications Procedure, treatment alternatives, risks and benefits explained, specific risks discussed. Immediately prior to procedure a time out was called to verify the correct patient, procedure, equipment, support staff and site/side marked as required. Patient was prepped and draped in the usual sterile fashion.    lidocaine lot #[id number], Exp. 08/23/2018 Kenalog lot# [id number] Exp 03/2018 Allergies: Lyrica [pregabalin]; Flexeril [cyclobenzaprine hcl]; Morphine and related; Sulfamethoxazole-trimethoprim; Belsomra [suvorexant]; and Tape   Assessment / Plan:     Visit Diagnoses: Fibromyalgia: She has generalized pain and discomfort and positive tender points. She's been having pain in bilateral trapezius area.she is on methocarbamol which is been helpful.  Primary osteoarthritis of right hand: Joint protection and muscle strengthening discussed.  Chronic midline low back pain without sciatica - She's been followed by Dr. Louanne Skye  Chronic SI joint pain: She's been having increased SI joint pain and requests cortisone injection. The procedures described above.  Trochanteric bursitis of both hips: She will do ITB and exercise.  H/O total knee replacement, bilateral: Doing well.  Primary  osteoarthritis of both feet - Right bunionectomy August 2017 by Dr. Sharol Given  Other medical problems are listed as follows:  History of sleep apnea  History of cerebral infarction  History of thrombophlebitis  History of gastric bypass - 11/20/2010  PFO (patent foramen ovale)    Orders: No orders of the defined types were placed in this encounter.  No orders of the defined types were placed in this encounter.   Face-to-face time spent with patient was 30 minutes. Greater than50% of time was spent in counseling and coordination of care.  Follow-Up Instructions: Return in about 6 months (around 05/30/2017) for FMS OA.   Bo Merino, MD  Note - This record has been created using [employer].  Chart creation errors have been sought, but may not always  have been located. Such creation errors do not reflect on  the standard of medical care.

## 2016-11-24 DIAGNOSIS — G4733 Obstructive sleep apnea (adult) (pediatric): Secondary | ICD-10-CM | POA: Diagnosis not present

## 2016-11-30 ENCOUNTER — Ambulatory Visit (INDEPENDENT_AMBULATORY_CARE_PROVIDER_SITE_OTHER): Payer: PPO | Admitting: Rheumatology

## 2016-11-30 ENCOUNTER — Encounter: Payer: Self-pay | Admitting: Rheumatology

## 2016-11-30 VITALS — BP 124/75 | HR 82 | Resp 16 | Ht 65.0 in | Wt 211.0 lb

## 2016-11-30 DIAGNOSIS — Q2112 Patent foramen ovale: Secondary | ICD-10-CM

## 2016-11-30 DIAGNOSIS — Z96653 Presence of artificial knee joint, bilateral: Secondary | ICD-10-CM | POA: Diagnosis not present

## 2016-11-30 DIAGNOSIS — Z8669 Personal history of other diseases of the nervous system and sense organs: Secondary | ICD-10-CM

## 2016-11-30 DIAGNOSIS — M19041 Primary osteoarthritis, right hand: Secondary | ICD-10-CM | POA: Diagnosis not present

## 2016-11-30 DIAGNOSIS — M533 Sacrococcygeal disorders, not elsewhere classified: Secondary | ICD-10-CM

## 2016-11-30 DIAGNOSIS — G8929 Other chronic pain: Secondary | ICD-10-CM

## 2016-11-30 DIAGNOSIS — M797 Fibromyalgia: Secondary | ICD-10-CM | POA: Diagnosis not present

## 2016-11-30 DIAGNOSIS — M545 Low back pain, unspecified: Secondary | ICD-10-CM

## 2016-11-30 DIAGNOSIS — Q211 Atrial septal defect: Secondary | ICD-10-CM

## 2016-11-30 DIAGNOSIS — M19071 Primary osteoarthritis, right ankle and foot: Secondary | ICD-10-CM

## 2016-11-30 DIAGNOSIS — Z8673 Personal history of transient ischemic attack (TIA), and cerebral infarction without residual deficits: Secondary | ICD-10-CM

## 2016-11-30 DIAGNOSIS — Z9884 Bariatric surgery status: Secondary | ICD-10-CM

## 2016-11-30 DIAGNOSIS — M7061 Trochanteric bursitis, right hip: Secondary | ICD-10-CM | POA: Diagnosis not present

## 2016-11-30 DIAGNOSIS — Z8672 Personal history of thrombophlebitis: Secondary | ICD-10-CM

## 2016-11-30 DIAGNOSIS — M7062 Trochanteric bursitis, left hip: Secondary | ICD-10-CM

## 2016-11-30 DIAGNOSIS — M19072 Primary osteoarthritis, left ankle and foot: Secondary | ICD-10-CM

## 2016-11-30 MED ORDER — TRIAMCINOLONE ACETONIDE 40 MG/ML IJ SUSP
40.0000 mg | INTRAMUSCULAR | Status: AC | PRN
  Administered 2016-11-30: 40 mg via INTRA_ARTICULAR

## 2016-11-30 MED ORDER — LIDOCAINE HCL 1 % IJ SOLN
1.0000 mL | INTRAMUSCULAR | Status: AC | PRN
  Administered 2016-11-30: 1 mL

## 2016-11-30 NOTE — Progress Notes (Deleted)
   Procedure Note  Patient: Debbie Pineda             Date of Birth: 10/13/1960           MRN: [id number]             Visit Date: 11/30/2016  Procedures: Visit Diagnoses: Fibromyalgia  Primary osteoarthritis of right hand  Chronic midline low back pain without sciatica - She's been followed by Dr. Louanne Skye  Chronic SI joint pain - Plan: Sacroiliac Joint Inj, lidocaine (XYLOCAINE) 1 % (with pres) injection 1 mL, lidocaine (XYLOCAINE) 1 % (with pres) injection 1 mL, triamcinolone acetonide (KENALOG-40) injection 40 mg, triamcinolone acetonide (KENALOG-40) injection 40 mg  Trochanteric bursitis of both hips  H/O total knee replacement, bilateral  Primary osteoarthritis of both feet - Right bunionectomy August 2017 by Dr. Sharol Given  History of sleep apnea  History of cerebral infarction  History of thrombophlebitis  History of gastric bypass - 11/20/2010  PFO (patent foramen ovale)  No procedures performed

## 2016-12-03 ENCOUNTER — Telehealth (HOSPITAL_COMMUNITY): Payer: Self-pay | Admitting: Physical Therapy

## 2016-12-03 ENCOUNTER — Telehealth (HOSPITAL_COMMUNITY): Payer: Self-pay | Admitting: Family Medicine

## 2016-12-03 NOTE — Telephone Encounter (Signed)
12/03/16  Pt called to schedule therapy for her back and asked about the referral.  She said that she was told we had the referral but when I pulled it up it's for Physical Medicine and Rehabilitation - Dr. Magnus Sinning.  I have sent a message to Dr. Louanne Skye to confirm where he wants her to go .... So far haven't gotten a response from him.

## 2016-12-03 NOTE — Telephone Encounter (Signed)
    12/03/16 4:13 PM  Spoke with patient and confrimed that our office has requested referral from her Md Dr. Louanne Skye.

## 2016-12-12 DIAGNOSIS — Z86711 Personal history of pulmonary embolism: Secondary | ICD-10-CM | POA: Diagnosis not present

## 2016-12-12 DIAGNOSIS — M797 Fibromyalgia: Secondary | ICD-10-CM | POA: Diagnosis not present

## 2016-12-12 DIAGNOSIS — N941 Unspecified dyspareunia: Secondary | ICD-10-CM | POA: Diagnosis not present

## 2016-12-12 DIAGNOSIS — N952 Postmenopausal atrophic vaginitis: Secondary | ICD-10-CM | POA: Diagnosis not present

## 2016-12-17 ENCOUNTER — Ambulatory Visit (INDEPENDENT_AMBULATORY_CARE_PROVIDER_SITE_OTHER): Payer: PPO | Admitting: Specialist

## 2016-12-17 ENCOUNTER — Encounter (INDEPENDENT_AMBULATORY_CARE_PROVIDER_SITE_OTHER): Payer: Self-pay | Admitting: Specialist

## 2016-12-17 VITALS — BP 103/57 | HR 58 | Ht 65.0 in | Wt 215.0 lb

## 2016-12-17 DIAGNOSIS — M47816 Spondylosis without myelopathy or radiculopathy, lumbar region: Secondary | ICD-10-CM

## 2016-12-17 DIAGNOSIS — M419 Scoliosis, unspecified: Secondary | ICD-10-CM | POA: Diagnosis not present

## 2016-12-17 NOTE — Patient Instructions (Addendum)
Avoid bending, stooping and avoid lifting weights greater than 10 lbs. Avoid prolong standing and walking. Avoid frequent bending and stooping  No lifting greater than 10 lbs. May use ice or moist heat for pain. Weight loss is of benefit. Handicap license is approved. Dr. Romona Curls secretary/Assistant will call to arrange for evaluation for a spinal cord stimulator.Ask the psychiastist if they perform Spinal Cord stimulator evaluation.  Follow-Up Instructions: Return in about 4 weeks (around 01/14/2017).

## 2016-12-17 NOTE — Progress Notes (Signed)
Office Visit Note   Patient: Debbie Pineda           Date of Birth: 1960-04-27           MRN: [id number] Visit Date: 12/17/2016              Requested by: Jake Samples, PA-C [address] [place], [place] [address] PCP: Jake Samples, PA-C   Assessment & Plan: Visit Diagnoses:  1. Spondylosis without myelopathy or radiculopathy, lumbar region   2. Scoliosis of thoracolumbar spine, unspecified scoliosis type     Plan: Avoid bending, stooping and avoid lifting weights greater than 10 lbs. Avoid prolong standing and walking. Avoid frequent bending and stooping  No lifting greater than 10 lbs. May use ice or moist heat for pain. Weight loss is of benefit. Handicap license is approved. Dr. Romona Curls secretary/Assistant will call to arrange for evaluation for a spinal cord stimulator. Ask the psychiastist if they perform Spinal Cord stimulator evaluation.  Follow-Up Instructions: Return in about 4 weeks (around 01/14/2017).   Orders:  No orders of the defined types were placed in this encounter.  No orders of the defined types were placed in this encounter.     Procedures: No procedures performed   Clinical Data: No additional findings.   Subjective: Chief Complaint  Patient presents with  . Lower Back - Follow-up    59 ear old female with low back pain, she has been to PT for her TKR and thought she was to have regular PT for her spine, No numbness or paresthesias. She is status post right TKR 07/2016. She had an injection that may have lasted about 3 weeks. Facet injections were right L3-4, L4-5 and L5-S1. She reports that Dr. Estanislado Pandy gave her additional injections.     Review of Systems  Constitutional: Negative.   HENT: Negative.   Eyes: Negative.   Respiratory: Negative.   Cardiovascular: Negative.   Gastrointestinal: Negative.   Endocrine: Negative.   Genitourinary: Negative.   Musculoskeletal: Negative.   Skin: Negative.     Allergic/Immunologic: Negative.   Neurological: Negative.   Hematological: Negative.   Psychiatric/Behavioral: Negative.      Objective: Vital Signs: BP (!) 103/57 (BP Location: Left Arm, Patient Position: Sitting)   Pulse (!) 58   Ht 5\' 5"  (1.651 m)   Wt 215 lb (97.5 kg)   BMI 35.78 kg/m   Physical Exam  Constitutional: She is oriented to person, place, and time. She appears well-developed and well-nourished.  HENT:  Head: Normocephalic and atraumatic.  Eyes: EOM are normal. Pupils are equal, round, and reactive to light.  Neck: Normal range of motion. Neck supple.  Pulmonary/Chest: Effort normal and breath sounds normal.  Abdominal: Soft. Bowel sounds are normal.  Neurological: She is alert and oriented to person, place, and time.  Skin: Skin is warm and dry.  Psychiatric: She has a normal mood and affect. Her behavior is normal. Judgment and thought content normal.    Back Exam   Tenderness  The patient is experiencing tenderness in the lumbar.  Range of Motion  Extension: abnormal  Flexion: normal  Lateral bend right: normal  Lateral bend left: normal  Rotation right: normal  Rotation left: normal   Tests  Straight leg raise right: negative  Other  Toe walk: normal Heel walk: normal Sensation: normal Gait: normal  Erythema: back redness Scars: absent      Specialty Comments:  No specialty comments available.  Imaging: No results found.  PMFS History: Patient Active Problem List   Diagnosis Date Noted  . Presence of right artificial knee joint 09/17/2016  . H/O total knee replacement, right 08/15/2016  . Presence of retained hardware   . Memory disorder 08/14/2016  . Cerebrovascular disease 08/14/2016  . Unilateral primary osteoarthritis, right knee 05/29/2016  . Primary osteoarthritis of both feet 05/29/2016  . Trochanteric bursitis of both hips 05/25/2016  . Neck pain 05/25/2016  . Urinary urgency 04/19/2016  . Chronic low back pain  01/11/2016  . Depression 11/29/2015  . 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 Heart palpitations 12/14/2013  . Generalized osteoarthritis of multiple sites 11/04/2013  . Cerebral infarction ([place]) 10/30/2011  . PFO (patent foramen ovale) 10/30/2011  . Bradycardia 10/30/2011  . Chest pain 10/30/2011  . Hypothyroid   . Thrombophlebitis   . Sleep apnea   . Fibromyalgia   . 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 Status post bariatric surgery 12/07/2010  . Vein disorder 11/29/2010  . S/P total hysterectomy and bilateral salpingo-oophorectomy 11/29/2010  . S/P exploratory laparotomy 11/29/2010  . S/P cholecystectomy 11/29/2010  . S/P ACL surgery 11/29/2010  . Morbid obesity ([place]) 11/10/2010   Past Medical History:  Diagnosis Date  . ADD (attention deficit disorder)    takes Adderall daily  . Arthritis    "all over"  . Cerebral infarction ([place]) 10/30/2011  . Cerebrovascular disease 08/14/2016  . Chronic back pain    "all over"  . Chronic low back pain 01/11/2016  . Complication of anesthesia    tends to have hypotension when NPO and post-anesthesia  . Constipation    takes stool softener daily  . Degenerative disk disease   . Degenerative joint disease   . Depression    takes Cymbalta daily for pain per pt  . DVT (deep venous thrombosis) (HCC)    RLE  . Family history of adverse reaction to anesthesia    a family member woke up during surgery; "think it was my mom"  . Fibromyalgia   . Generalized osteoarthritis of multiple sites 11/04/2013  . GERD (gastroesophageal reflux disease)   . Heart palpitations 12/14/2013  . History of blood clots    superficial  . Hypoglycemia   . Hypothyroid    takes Synthroid daily  . Incomplete emptying of bladder   . Insomnia    takes Trazodone nightly  . Iron deficiency anemia    takes Ferrous Sulfate daily  . Joint pain   . Joint swelling    knees and ankles  . Memory disorder 08/14/2016  . Morbid obesity ([place])     . Nausea    takes Zofran as needed.Seeing GI doc  . Neck pain 05/25/2016  . OSA on CPAP    tested more than 5 yrs ago.    . Osteoarthritis   . PFO (patent foramen ovale)   . Primary osteoarthritis of both feet 05/29/2016   Right bunionectomy August 2017 by Dr. Sharol Given  . Scoliosis   . Sleep apnea   . Stroke [facility]) "several"   right foot weakness; memory issues, black spot right visual field since" (03/23/2015)  . Thrombophlebitis   . Trochanteric bursitis of both hips 05/25/2016  . Unilateral primary osteoarthritis, right knee 05/29/2016  . Urinary urgency 04/19/2016  . Vein disorder 11/29/2010    Family History  Problem Relation Age of Onset  . Heart disease Father   . Cancer Father   . Cancer Mother        skin  cancer   . Heart disease Brother   . Cancer Brother   . Diabetes Brother   . Stroke Brother   . Heart disease Sister   . Cancer Maternal Grandfather   . Hypothyroidism Daughter   . Colon cancer Neg Hx     Past Surgical History:  Procedure Laterality Date  . BUNIONECTOMY Right 08/2015  . CARDIAC CATHETERIZATION     2008.  "it was fine" (not sure why she had it done, and doesn't know where)  . COLONOSCOPY N/A 03/25/2013   Procedure: COLONOSCOPY;  Surgeon: Rogene Houston, MD;  Location[facility];  Service: Endoscopy;  Laterality: N/A;  930  . ESOPHAGOGASTRODUODENOSCOPY    . EXPLORATORY LAPAROTOMY     "took fallopian tubes out"  . JOINT REPLACEMENT     bil knee   . KNEE ARTHROPLASTY    . KNEE ARTHROSCOPY Left   . KNEE ARTHROSCOPY W/ ACL RECONSTRUCTION Right    "added pins"  . LAPAROSCOPIC CHOLECYSTECTOMY  ~ 2001  . 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 TOTAL KNEE ARTHROPLASTY Left 03/23/2015   Procedure: TOTAL KNEE ARTHROPLASTY;  Surgeon: Newt Minion, MD;  Location: [place];  Service: Orthopedics;  Laterality: Left;  . TOTAL KNEE ARTHROPLASTY Right 08/15/2016   Procedure: RIGHT TOTAL KNEE ARTHROPLASTY, REMOVAL ACL SCREWS;  Surgeon: Newt Minion, MD;  Location: [place];  Service: Orthopedics;  Laterality: Right;  . TOTAL KNEE ARTHROPLASTY WITH HARDWARE REMOVAL Right   . VAGINAL HYSTERECTOMY    . VARICOSE VEIN SURGERY Right X 2   Social History   Occupational History  . Occupation: Disability  . Occupation: formerly Therapist, sports, [employer]  Tobacco Use  . Smoking status: Former Smoker    Packs/day: 0.75    Years: 8.00    Pack years: 6.00    Types: Cigarettes    Last attempt to quit: 12/01/1990    Years since quitting: 26.0  . Smokeless tobacco: Never Used  . Tobacco comment: quit smoking in the 1990s  Substance and Sexual Activity  . Alcohol use: No    Comment: 03/23/2015 "stopped drinking in 2012 w/gastric bypass; drank socially before bypass"  . Drug use: No  . Sexual activity: Not Currently    Birth control/protection: Surgical

## 2016-12-18 DIAGNOSIS — F909 Attention-deficit hyperactivity disorder, unspecified type: Secondary | ICD-10-CM | POA: Diagnosis not present

## 2016-12-19 DIAGNOSIS — E6609 Other obesity due to excess calories: Secondary | ICD-10-CM | POA: Diagnosis not present

## 2016-12-19 DIAGNOSIS — Z9884 Bariatric surgery status: Secondary | ICD-10-CM | POA: Diagnosis not present

## 2016-12-19 DIAGNOSIS — Z23 Encounter for immunization: Secondary | ICD-10-CM | POA: Diagnosis not present

## 2016-12-19 DIAGNOSIS — Z6834 Body mass index (BMI) 34.0-34.9, adult: Secondary | ICD-10-CM | POA: Diagnosis not present

## 2016-12-19 DIAGNOSIS — M62830 Muscle spasm of back: Secondary | ICD-10-CM | POA: Diagnosis not present

## 2016-12-19 DIAGNOSIS — I10 Essential (primary) hypertension: Secondary | ICD-10-CM | POA: Diagnosis not present

## 2016-12-19 DIAGNOSIS — M797 Fibromyalgia: Secondary | ICD-10-CM | POA: Diagnosis not present

## 2016-12-20 ENCOUNTER — Ambulatory Visit (INDEPENDENT_AMBULATORY_CARE_PROVIDER_SITE_OTHER): Payer: PPO | Admitting: Specialist

## 2016-12-21 ENCOUNTER — Ambulatory Visit (INDEPENDENT_AMBULATORY_CARE_PROVIDER_SITE_OTHER): Payer: PPO | Admitting: Specialist

## 2016-12-24 DIAGNOSIS — G4733 Obstructive sleep apnea (adult) (pediatric): Secondary | ICD-10-CM | POA: Diagnosis not present

## 2017-01-01 ENCOUNTER — Ambulatory Visit (INDEPENDENT_AMBULATORY_CARE_PROVIDER_SITE_OTHER): Payer: PPO | Admitting: Physical Medicine and Rehabilitation

## 2017-01-08 ENCOUNTER — Ambulatory Visit (INDEPENDENT_AMBULATORY_CARE_PROVIDER_SITE_OTHER): Payer: PPO | Admitting: Physical Medicine and Rehabilitation

## 2017-01-08 ENCOUNTER — Encounter (INDEPENDENT_AMBULATORY_CARE_PROVIDER_SITE_OTHER): Payer: Self-pay | Admitting: Physical Medicine and Rehabilitation

## 2017-01-08 VITALS — BP 109/65 | HR 58

## 2017-01-08 DIAGNOSIS — M797 Fibromyalgia: Secondary | ICD-10-CM | POA: Diagnosis not present

## 2017-01-08 DIAGNOSIS — M5416 Radiculopathy, lumbar region: Secondary | ICD-10-CM

## 2017-01-08 DIAGNOSIS — G629 Polyneuropathy, unspecified: Secondary | ICD-10-CM

## 2017-01-08 DIAGNOSIS — M5136 Other intervertebral disc degeneration, lumbar region: Secondary | ICD-10-CM

## 2017-01-08 DIAGNOSIS — M419 Scoliosis, unspecified: Secondary | ICD-10-CM

## 2017-01-08 DIAGNOSIS — G894 Chronic pain syndrome: Secondary | ICD-10-CM

## 2017-01-08 NOTE — Progress Notes (Deleted)
Lower back and leg pain right worse than left. Left foot pain and calf pain. Here to discuss SCS Trial.

## 2017-01-10 ENCOUNTER — Encounter: Payer: Self-pay | Admitting: Neurology

## 2017-01-10 ENCOUNTER — Ambulatory Visit (INDEPENDENT_AMBULATORY_CARE_PROVIDER_SITE_OTHER): Payer: PPO | Admitting: Neurology

## 2017-01-10 VITALS — BP 132/71 | HR 69 | Ht 65.0 in | Wt 211.0 lb

## 2017-01-10 DIAGNOSIS — R413 Other amnesia: Secondary | ICD-10-CM | POA: Diagnosis not present

## 2017-01-10 MED ORDER — DONEPEZIL HCL 5 MG PO TABS: 5.0000 mg | ORAL_TABLET | Freq: Every day | ORAL | 1 refills | Status: DC

## 2017-01-10 NOTE — Patient Instructions (Signed)
    We will get MRI of the brain and start aricept for memory. Stop the oxybutinin.  Begin Aricept (donepezil) at 5 mg at night for one month. If this medication is well-tolerated, please call our office and we will call in a prescription for the 10 mg tablets. Look out for side effects that may include nausea, diarrhea, weight loss, or stomach cramps. This medication will also cause a runny nose, therefore there is no need for allergy medications for this purpose.

## 2017-01-10 NOTE — Progress Notes (Signed)
Reason for visit: Fibromyalgia, memory disturbance  Debbie Pineda is an 56 y.o. female  History of present illness:  [name] is a 56 year old right-handed white female with a history of fibromyalgia, and a history of cerebrovascular disease.  The patient has had problems with DVT in the past, she has a patent foramen ovale, she has had strokes previously.  She is on chronic anticoagulation.  The patient is on a multitude of various medications for various ailments.  She reports a progressive problem with a decline in memory, she has difficulty with word finding, short-term memory, she has difficulty keeping up with finances and medications.  She is able to manage appointments.  She is not sleeping well, she has chronic insomnia and takes trazodone for this.  She is on several psychoactive medications including gabapentin, oxybutynin, Cymbalta, opiate medications, trazodone, Ultram, Topamax, and Robaxin.  The patient takes Ritalin to help combat drowsiness and fatigue during the day from her other medications.  The patient is being considered for a spinal stimulator for her chronic low back pain.  She has had knee surgery on the right in the summer 2018.  She has recovered from this.  She returns for an evaluation.  She was on Vayacog, but this offered little benefit and she stopped the medication.  Past Medical History:  Diagnosis Date  . ADD (attention deficit disorder)    takes Adderall daily  . Arthritis    "all over"  . Cerebral infarction ([place]) 10/30/2011  . Cerebrovascular disease 08/14/2016  . Chronic back pain    "all over"  . Chronic low back pain 01/11/2016  . Complication of anesthesia    tends to have hypotension when NPO and post-anesthesia  . Constipation    takes stool softener daily  . Degenerative disk disease   . Degenerative joint disease   . Depression    takes Cymbalta daily for pain per pt  . DVT (deep venous thrombosis) (HCC)    RLE  . Family history  of adverse reaction to anesthesia    a family member woke up during surgery; "think it was my mom"  . Fibromyalgia   . Generalized osteoarthritis of multiple sites 11/04/2013  . GERD (gastroesophageal reflux disease)   . Heart palpitations 12/14/2013  . History of blood clots    superficial  . Hypoglycemia   . Hypothyroid    takes Synthroid daily  . Incomplete emptying of bladder   . Insomnia    takes Trazodone nightly  . Iron deficiency anemia    takes Ferrous Sulfate daily  . Joint pain   . Joint swelling    knees and ankles  . Memory disorder 08/14/2016  . Morbid obesity ([place])   . Nausea    takes Zofran as needed.Seeing GI doc  . Neck pain 05/25/2016  . OSA on CPAP    tested more than 5 yrs ago.    . Osteoarthritis   . PFO (patent foramen ovale)   . Primary osteoarthritis of both feet 05/29/2016   Right bunionectomy August 2017 by Dr. Sharol Given  . Scoliosis   . Sleep apnea   . Stroke [facility]) "several"   right foot weakness; memory issues, black spot right visual field since" (03/23/2015)  . Thrombophlebitis   . Trochanteric bursitis of both hips 05/25/2016  . Unilateral primary osteoarthritis, right knee 05/29/2016  . Urinary urgency 04/19/2016  . Vein disorder 11/29/2010    Past Surgical History:  Procedure Laterality Date  . BUNIONECTOMY  Right 08/2015  . CARDIAC CATHETERIZATION     2008.  "it was fine" (not sure why she had it done, and doesn't know where)  . COLONOSCOPY N/A 03/25/2013   Procedure: COLONOSCOPY;  Surgeon: Rogene Houston, MD;  [facility];  Service: Endoscopy;  Laterality: N/A;  930  . ESOPHAGOGASTRODUODENOSCOPY    . EXPLORATORY LAPAROTOMY     "took fallopian tubes out"  . JOINT REPLACEMENT     bil knee   . KNEE ARTHROPLASTY    . KNEE ARTHROSCOPY Left   . KNEE ARTHROSCOPY W/ ACL RECONSTRUCTION Right    "added pins"  . LAPAROSCOPIC CHOLECYSTECTOMY  ~ 2001  . 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 TOTAL KNEE ARTHROPLASTY Left 03/23/2015   Procedure:  TOTAL KNEE ARTHROPLASTY;  Surgeon: Newt Minion, MD;  Location: [place];  Service: Orthopedics;  Laterality: Left;  . TOTAL KNEE ARTHROPLASTY Right 08/15/2016   Procedure: RIGHT TOTAL KNEE ARTHROPLASTY, REMOVAL ACL SCREWS;  Surgeon: Newt Minion, MD;  Location: [place];  Service: Orthopedics;  Laterality: Right;  . TOTAL KNEE ARTHROPLASTY WITH HARDWARE REMOVAL Right   . VAGINAL HYSTERECTOMY    . VARICOSE VEIN SURGERY Right X 2    Family History  Problem Relation Age of Onset  . Heart disease Father   . Cancer Father   . Cancer Mother        skin cancer   . Heart disease Brother   . Cancer Brother   . Diabetes Brother   . Stroke Brother   . Heart disease Sister   . Cancer Maternal Grandfather   . Hypothyroidism Daughter   . Colon cancer Neg Hx     Social history:  reports that she quit smoking about 26 years ago. Her smoking use included cigarettes. She has a 6.00 pack-year smoking history. she has never used smokeless tobacco. She reports that she does not drink alcohol or use drugs.    Allergies  Allergen Reactions  . Lyrica [Pregabalin] Other (See Comments)    SOB, lower extremity edema and weight gain  . Flexeril [Cyclobenzaprine Hcl] Other (See Comments)    Numbness of extremities  . Morphine And Related Itching    Upper torso  . Sulfamethoxazole-Trimethoprim Itching and Rash    Bactrim  . Belsomra [Suvorexant] Other (See Comments)    unknown  . Tape Itching and Rash    Please use "paper" tape Rash if left on longer than 24 hrs    Medications:  Prior to Admission medications   Medication Sig Start Date End Date Taking? Authorizing Provider  Acetaminophen (TYLENOL PO) Take by mouth at bedtime as needed.   Yes [provider]  amoxicillin (AMOXIL) 500 MG capsule as needed. For dental procedures 10/21/16  Yes [provider]  baclofen (LIORESAL) 10 MG tablet  12/19/16  Yes [provider]  Cyanocobalamin (B-12) 2500 MCG SUBL Place 2,500 mcg  under the tongue daily.   Yes [provider]  Cyanocobalamin (VITAMIN B-12) 5000 MCG SUBL Place daily under the tongue.   Yes [provider]  diclofenac sodium (VOLTAREN) 1 % GEL Apply 4 g topically 4 (four) times daily as needed (PAIN). Reported on 03/21/2015   Yes [provider]  DULoxetine (CYMBALTA) 60 MG capsule Take 60 mg by mouth 2 (two) times daily. 01/17/15  Yes [provider]  ferrous sulfate 325 (65 FE) MG tablet Take 325 mg by mouth daily at 12 noon.   Yes [provider]  folic acid (FOLVITE) 161 MCG tablet Take 400 mcg by mouth daily.   Yes [provider]  gabapentin (NEURONTIN) 600 MG tablet Take 600 mg by mouth 3 (three) times daily.   Yes [provider]  INTRAROSA 6.5 MG INST  11/30/16  Yes [provider]  Javier Docker Oil 350 MG CAPS Take 350 mg by mouth at bedtime.   Yes [provider]  L-Methylfolate-B12-B6-B2 (CEREFOLIN) 06-22-48-5 MG TABS Take 1 tablet by mouth daily. 08/27/16  Yes [name], MD  levothyroxine (SYNTHROID, LEVOTHROID) 125 MCG tablet Take 125 mcg by mouth daily before breakfast.   Yes [provider]  LINZESS 72 MCG capsule Take 72 mcg by mouth daily as needed (constipation).  11/25/15  Yes [provider]  Melatonin 10 MG TABS Take 20 mg by mouth at bedtime as needed (SLEEP).   Yes [provider]  Menthol, Topical Analgesic, (BIOFREEZE EX) Apply 1 application topically daily as needed (pain).   Yes [provider]  methocarbamol (ROBAXIN) 750 MG tablet TAKE 1 TABLET BY MOUTH THREE TIMES DAILY 07/31/16  Yes [name], [name], MD  methylphenidate (RITALIN) 20 MG tablet Take 20 mg by mouth 3 (three) times daily. Takes at 0800, 1200, 1600   Yes [provider]  mirabegron ER (MYRBETRIQ) 50 MG TB24 tablet Take 50 mg by mouth daily.   Yes [provider]  Multiple Minerals-Vitamins (CAL-MAG-ZINC-D PO) Take 1-2 tablets by mouth See  admin instructions. Take 2 tablet at 1200 and 1 tablet at 1600  Calcium 1000 mg Vit D 600 iu Magnesium 400 mg Zinc 15 mg   Yes [provider]  Multiple Vitamin (MULTIVITAMIN WITH MINERALS) TABS tablet Take 1 tablet by mouth 2 (two) times daily. One a day petites   Yes [provider]  NARCAN 4 MG/0.1ML LIQD nasal spray kit as needed. 11/14/16  Yes [provider]  omeprazole (PRILOSEC) 40 MG capsule Take 1 capsule by mouth twice daily. 10/04/16  Yes Setzer, Terri L, NP  ondansetron (ZOFRAN-ODT) 4 MG disintegrating tablet Take 4 mg by mouth every 6 (six) hours as needed for nausea.  03/02/15  Yes [provider]  oxybutynin (DITROPAN-XL) 10 MG 24 hr tablet Take 10 mg by mouth daily.   Yes [provider]  Oxycodone HCl 10 MG TABS TAKE 1 TYABLET BY MOUTH EVERY 4-6 HOURS AS NEEDED FOR PAIN 03/05/15  Yes [provider]  polyethylene glycol powder (MIRALAX) powder Take 1 Container daily by mouth.   Yes [provider]  Pseudoephedrine HCl (SUDAFED PO) Take by mouth at bedtime as needed.   Yes [provider]  rivaroxaban (XARELTO) 20 MG TABS tablet Take 1 tablet (20 mg total) by mouth daily with supper. Patient taking differently: Take 20 mg by mouth at bedtime.  03/25/15  Yes Newt Minion, MD  topiramate (TOPAMAX) 25 MG tablet Take 25 mg by mouth 2 (two) times daily.   Yes [provider]  traMADol (ULTRAM) 50 MG tablet Take 50 mg by mouth every 6 (six) hours as needed (FOR BREAK THROUGH PAIN).  10/13/15  Yes [provider]  traZODone (DESYREL) 100 MG tablet Take 100-300 mg by mouth at bedtime as needed for sleep (depends on insomnia if takes 1-3 tablets).    Yes [provider]  UNABLE TO FIND Bilateral Knee high 20-30 mmHg compression stockings. 08/08/16  Yes Vickie Epley, MD  vitamin C (ASCORBIC ACID) 500 MG tablet Take 500 mg by mouth daily.  Yes [provider]  Vitamin D,  Ergocalciferol, (DRISDOL) 50000 UNITS CAPS Take 50,000 Units by mouth every 7 (seven) days. Mondays   Yes [provider]    ROS:  Out of a complete 14 system review of symptoms, the patient complains only of the following symptoms, and all other reviewed systems are negative.  Fatigue, excessive sweating Leg pain Cold intolerance, excessive thirst, excessive eating Abdominal pain, black stools, constipation, nausea Insomnia, sleep apnea, daytime sleepiness, snoring Frequent infections Incontinent of the bladder Joint pain, back pain, walking difficulty Itching Bruising easily Memory loss, speech difficulty, weakness Agitation, confusion, decreased concentration, anxiety, hyperactivity  Blood pressure 132/71, pulse 69, height '5\' 5"'  (1.651 m), weight 211 lb (95.7 kg).  Physical Exam  General: The patient is alert and cooperative at the time of the examination.  The patient has a flat affect, she is moderately obese.  Skin: No significant peripheral edema is noted.   Neurologic Exam  Mental status: The patient is alert and oriented x 3 at the time of the examination. The patient has apparent normal recent and remote memory, with an apparently normal attention span and concentration ability.  Mini-Mental status examination done today shows a total score of 30/30.   Cranial nerves: Facial symmetry is present. Speech is normal, no aphasia or dysarthria is noted. Extraocular movements are full. Visual fields are full.  Motor: The patient has good strength in all 4 extremities.  Sensory examination: Soft touch sensation is symmetric on the face, arms, and legs.  Coordination: The patient has good finger-nose-finger and heel-to-shin bilaterally.  Gait and station: The patient has a slightly wide-based gait, she normally walks with a cane. Tandem gait is slightly unsteady. Romberg is negative. No drift is seen.  Reflexes: Deep tendon reflexes are  symmetric.   Assessment/Plan:  1.  Fibromyalgia  2.  Chronic low back pain  3.  Reports of memory disturbance  4.  Polypharmacy  5.  Chronic insomnia  The patient is on a multitude of medications that do have psychoactive side effects.  The patient is on oxybutynin which is anticholinergic.  The patient will stop this medication use Myrbetriq for her bladder.  The patient will go on Aricept taking 5 mg at night for 1 month and then go to 10 mg at night if she is tolerating the drug.  She will have MRI of the brain.  She will follow-up in 6 months.  The patient recently lost her sister following a heart attack.  Jill Alexanders MD 01/10/2017 8:18 AM  [facility] [address] [place], [place] [address]  Phone [phone] Fax [phone]

## 2017-01-18 ENCOUNTER — Ambulatory Visit (INDEPENDENT_AMBULATORY_CARE_PROVIDER_SITE_OTHER): Payer: PPO | Admitting: Specialist

## 2017-01-18 ENCOUNTER — Encounter (INDEPENDENT_AMBULATORY_CARE_PROVIDER_SITE_OTHER): Payer: Self-pay | Admitting: Specialist

## 2017-01-18 VITALS — BP 126/73 | HR 110

## 2017-01-18 DIAGNOSIS — M4805 Spinal stenosis, thoracolumbar region: Secondary | ICD-10-CM | POA: Diagnosis not present

## 2017-01-18 DIAGNOSIS — M25512 Pain in left shoulder: Secondary | ICD-10-CM

## 2017-01-18 DIAGNOSIS — M25511 Pain in right shoulder: Secondary | ICD-10-CM

## 2017-01-18 DIAGNOSIS — M4125 Other idiopathic scoliosis, thoracolumbar region: Secondary | ICD-10-CM

## 2017-01-18 MED ORDER — METHYLPREDNISOLONE ACETATE 40 MG/ML IJ SUSP
40.0000 mg | INTRAMUSCULAR | Status: AC | PRN
  Administered 2017-01-18: 40 mg via INTRAMUSCULAR

## 2017-01-18 MED ORDER — BUPIVACAINE HCL 0.5 % IJ SOLN
2.0000 mL | INTRAMUSCULAR | Status: AC | PRN
  Administered 2017-01-18: 2 mL

## 2017-01-18 MED ORDER — LIDOCAINE HCL 1 % IJ SOLN
2.0000 mL | INTRAMUSCULAR | Status: AC | PRN
  Administered 2017-01-18: 2 mL

## 2017-01-18 NOTE — Progress Notes (Signed)
Office Visit Note   Patient: Debbie Pineda           Date of Birth: 03-Jun-1960           MRN: [id number] Visit Date: 01/18/2017              Requested by: Jake Samples, PA-C [address] [place], [place] [address] PCP: Jake Samples, PA-C   Assessment & Plan: Visit Diagnoses:  1. Spinal stenosis of thoracolumbar region   2. Other idiopathic scoliosis, thoracolumbar region   3. Trigger point of shoulder region, left   4. Trigger point of shoulder region, right     Plan: Avoid frequent bending and stooping  No lifting greater than 10 lbs. May use ice or moist heat for pain. Weight loss is of benefit. Handicap license is approved.  Avoid bending, stooping and avoid lifting weights greater than 10 lbs. Avoid prolong standing and walking. Avoid frequent bending and stooping  No lifting greater than 10 lbs. May use ice or moist heat for pain. Weight loss is of benefit. Handicap license is approved. Dr. Romona Curls secretary/Assistant will call to arrange for follow up of SCS.  Follow-Up Instructions: Return in about 3 months (around 04/18/2017).   Orders:  Orders Placed This Encounter  Procedures  . Trigger Point Inj   No orders of the defined types were placed in this encounter.     Procedures: Trigger Point Inj Date/Time: 01/18/2017 12:07 PM Performed by: Jessy Oto, MD Authorized by: Jessy Oto, MD   Site marked: the procedure site was marked   Indications:  Muscle spasm and pain Total # of Trigger Points:  2 Location: shoulder   Needle Size:  25 G Approach:  Dorsal Medications #1:  2 mL lidocaine 1 %; 2 mL bupivacaine 0.5 %; 40 mg methylPREDNISolone acetate 40 MG/ML Comments: bandaids applied     Clinical Data: No additional findings.   Subjective: Chief Complaint  Patient presents with  . Lower Back - Pain, Follow-up    56 year old female with a history of neck and back pain, has degenerative scoliosis of the  thoracolumbar spine and fibromyalgia. Previous left total knee replacement and a more recent right TKR by Dr. Sharol Given that continues to be a source of pain. She sees a psychiatrist and has had a recent death of a sister 62 years older than her who had a massive MI 01/10/2017. She has seen Dr. Jannifer Franklin and he has assessed her concerns of memory loss and coordination difficulty and it is likely multifactoral, but he also thinks MRI of the brain is warranted and this is scheduled.     Review of Systems  Constitutional: Negative.   HENT: Negative.   Eyes: Negative.   Respiratory: Negative.   Cardiovascular: Negative.   Gastrointestinal: Negative.   Endocrine: Negative.   Genitourinary: Negative.   Musculoskeletal: Negative.   Skin: Negative.   Allergic/Immunologic: Negative.   Neurological: Negative.   Hematological: Negative.   Psychiatric/Behavioral: Negative.      Objective: Vital Signs: BP 126/73   Pulse (!) 110   Physical Exam  Constitutional: She is oriented to person, place, and time. She appears well-developed and well-nourished.  HENT:  Head: Normocephalic and atraumatic.  Eyes: EOM are normal. Pupils are equal, round, and reactive to light.  Neck: Normal range of motion. Neck supple.  Pulmonary/Chest: Effort normal and breath sounds normal.  Abdominal: Soft. Bowel sounds are normal.  Musculoskeletal: Normal range of motion.  Neurological:  She is alert and oriented to person, place, and time.  Skin: Skin is warm and dry.  Psychiatric: She has a normal mood and affect. Her behavior is normal. Judgment and thought content normal.    Ortho Exam  Specialty Comments:  No specialty comments available.  Imaging: No results found.   PMFS History: Patient Active Problem List   Diagnosis Date Noted  . Presence of right artificial knee joint 09/17/2016  . H/O total knee replacement, right 08/15/2016  . Presence of retained hardware   . Memory disorder 08/14/2016  .  Cerebrovascular disease 08/14/2016  . Unilateral primary osteoarthritis, right knee 05/29/2016  . Primary osteoarthritis of both feet 05/29/2016  . Trochanteric bursitis of both hips 05/25/2016  . Neck pain 05/25/2016  . Urinary urgency 04/19/2016  . Chronic low back pain 01/11/2016  . Depression 11/29/2015  . 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 Heart palpitations 12/14/2013  . Generalized osteoarthritis of multiple sites 11/04/2013  . Cerebral infarction ([place]) 10/30/2011  . PFO (patent foramen ovale) 10/30/2011  . Bradycardia 10/30/2011  . Chest pain 10/30/2011  . Hypothyroid   . Thrombophlebitis   . Sleep apnea   . Fibromyalgia   . 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 Status post bariatric surgery 12/07/2010  . Vein disorder 11/29/2010  . S/P total hysterectomy and bilateral salpingo-oophorectomy 11/29/2010  . S/P exploratory laparotomy 11/29/2010  . S/P cholecystectomy 11/29/2010  . S/P ACL surgery 11/29/2010  . Morbid obesity ([place]) 11/10/2010   Past Medical History:  Diagnosis Date  . ADD (attention deficit disorder)    takes Adderall daily  . Arthritis    "all over"  . Cerebral infarction ([place]) 10/30/2011  . Cerebrovascular disease 08/14/2016  . Chronic back pain    "all over"  . Chronic low back pain 01/11/2016  . Complication of anesthesia    tends to have hypotension when NPO and post-anesthesia  . Constipation    takes stool softener daily  . Degenerative disk disease   . Degenerative joint disease   . Depression    takes Cymbalta daily for pain per pt  . DVT (deep venous thrombosis) (HCC)    RLE  . Family history of adverse reaction to anesthesia    a family member woke up during surgery; "think it was my mom"  . Fibromyalgia   . Generalized osteoarthritis of multiple sites 11/04/2013  . GERD (gastroesophageal reflux disease)   . Heart palpitations 12/14/2013  . History of blood clots    superficial  . Hypoglycemia   .  Hypothyroid    takes Synthroid daily  . Incomplete emptying of bladder   . Insomnia    takes Trazodone nightly  . Iron deficiency anemia    takes Ferrous Sulfate daily  . Joint pain   . Joint swelling    knees and ankles  . Memory disorder 08/14/2016  . Morbid obesity ([place])   . Nausea    takes Zofran as needed.Seeing GI doc  . Neck pain 05/25/2016  . OSA on CPAP    tested more than 5 yrs ago.    . Osteoarthritis   . PFO (patent foramen ovale)   . Primary osteoarthritis of both feet 05/29/2016   Right bunionectomy August 2017 by Dr. Sharol Given  . Scoliosis   . Sleep apnea   . Stroke [facility]) "several"   right foot weakness; memory issues, black spot right visual field since" (03/23/2015)  . Thrombophlebitis   . Trochanteric bursitis of both  hips 05/25/2016  . Unilateral primary osteoarthritis, right knee 05/29/2016  . Urinary urgency 04/19/2016  . Vein disorder 11/29/2010    Family History  Problem Relation Age of Onset  . Heart disease Father   . Cancer Father   . Cancer Mother        skin cancer   . Heart disease Brother   . Cancer Brother   . Diabetes Brother   . Stroke Brother   . Heart disease Sister   . Cancer Maternal Grandfather   . Hypothyroidism Daughter   . Colon cancer Neg Hx     Past Surgical History:  Procedure Laterality Date  . BUNIONECTOMY Right 08/2015  . CARDIAC CATHETERIZATION     2008.  "it was fine" (not sure why she had it done, and doesn't know where)  . COLONOSCOPY N/A 03/25/2013   Procedure: COLONOSCOPY;  Surgeon: Rogene Houston, MD;  [facility];  Service: Endoscopy;  Laterality: N/A;  930  . ESOPHAGOGASTRODUODENOSCOPY    . EXPLORATORY LAPAROTOMY     "took fallopian tubes out"  . JOINT REPLACEMENT     bil knee   . KNEE ARTHROPLASTY    . KNEE ARTHROSCOPY Left   . KNEE ARTHROSCOPY W/ ACL RECONSTRUCTION Right    "added pins"  . LAPAROSCOPIC CHOLECYSTECTOMY  ~ 2001  . 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 TOTAL KNEE ARTHROPLASTY Left  03/23/2015   Procedure: TOTAL KNEE ARTHROPLASTY;  Surgeon: Newt Minion, MD;  Location: [place];  Service: Orthopedics;  Laterality: Left;  . TOTAL KNEE ARTHROPLASTY Right 08/15/2016   Procedure: RIGHT TOTAL KNEE ARTHROPLASTY, REMOVAL ACL SCREWS;  Surgeon: Newt Minion, MD;  Location: [place];  Service: Orthopedics;  Laterality: Right;  . TOTAL KNEE ARTHROPLASTY WITH HARDWARE REMOVAL Right   . VAGINAL HYSTERECTOMY    . VARICOSE VEIN SURGERY Right X 2   Social History   Occupational History  . Occupation: Disability  . Occupation: formerly Therapist, sports, [employer]  Tobacco Use  . Smoking status: Former Smoker    Packs/day: 0.75    Years: 8.00    Pack years: 6.00    Types: Cigarettes    Last attempt to quit: 12/01/1990    Years since quitting: 26.1  . Smokeless tobacco: Never Used  . Tobacco comment: quit smoking in the 1990s  Substance and Sexual Activity  . Alcohol use: No    Comment: 03/23/2015 "stopped drinking in 2012 w/gastric bypass; drank socially before bypass"  . Drug use: No  . Sexual activity: Not Currently    Birth control/protection: Surgical

## 2017-01-18 NOTE — Patient Instructions (Signed)
Avoid frequent bending and stooping  No lifting greater than 10 lbs. May use ice or moist heat for pain. Weight loss is of benefit. Handicap license is approved.  Avoid bending, stooping and avoid lifting weights greater than 10 lbs. Avoid prolong standing and walking. Avoid frequent bending and stooping  No lifting greater than 10 lbs. May use ice or moist heat for pain. Weight loss is of benefit. Handicap license is approved. Dr. Romona Curls secretary/Assistant will call to arrange for follow up of SCS.

## 2017-01-23 ENCOUNTER — Encounter (INDEPENDENT_AMBULATORY_CARE_PROVIDER_SITE_OTHER): Payer: Self-pay | Admitting: Physical Medicine and Rehabilitation

## 2017-01-23 ENCOUNTER — Ambulatory Visit (INDEPENDENT_AMBULATORY_CARE_PROVIDER_SITE_OTHER): Payer: PPO | Admitting: Physical Medicine and Rehabilitation

## 2017-01-23 NOTE — Progress Notes (Signed)
Debbie Pineda - 57 y.o. female MRN [id number]  Date of birth: 1960-06-28  Office Visit Note: Visit Date: 01/08/2017 PCP: Jake Samples, PA-C Referred by: Jake Samples, PA*  Subjective: Chief Complaint  Patient presents with  . Lower Back - Pain   HPI: [name] is a pleasant 57 year old female with chronic progressive low back pain hip and leg pain with various orthopedic complaints as well.  She has been followed by Dr. Louanne Skye and Dr. Sharol Given in our office.  I have seen the patient on a few occasions for spinal injection.  Most recently in June of this year we completed bilateral radiofrequency ablation of the lower facet joints from L3-4 to L5-S1.  He did initially get good relief of her symptoms but then following the replacement she has had increasing back pain since that time.  She has had recent MRI and CT myelogram which are reviewed below.  She reports that her back pain and hip pain level is just as bad as it was prior to the ablation.  Dr. Louanne Skye felt like she would be a good candidate for spinal cord stimulator trial.  She has had no focal weakness but is felt weak at times.  She has had no new symptoms other than just progressive pain.  Her case is complicated by obesity as well as fibromyalgia.  She is followed by Dr. Estanislado Pandy  In rheumatology.  She also carries a diagnosis of neuropathy and is followed by a neurologist.  This is a Dr. Kalman Shan.  Her symptoms are fairly constant but worse with standing and ambulating.  She gets a lot of pain with twisting.  She has mostly right-sided knee pain as well status post knee replacement.  Replacement was performed in July of this year.  She has had ongoing physical therapy for the knee and lower back.  Continues to take oxycodone as well as tramadol gabapentin.  Has had some issues with memory loss.  She does see a psychiatrist for prolonged depression after her sister's death.  She is present today as usual with her husband who  provides some of the history.    Review of Systems  Constitutional: Positive for malaise/fatigue. Negative for chills, fever and weight loss.  HENT: Negative for hearing loss and sinus pain.   Eyes: Negative for blurred vision, double vision and photophobia.  Respiratory: Negative for cough and shortness of breath.   Cardiovascular: Negative for chest pain, palpitations and leg swelling.  Gastrointestinal: Negative for abdominal pain, nausea and vomiting.  Genitourinary: Negative for flank pain.  Musculoskeletal: Positive for back pain and joint pain. Negative for myalgias.  Skin: Negative for itching and rash.  Neurological: Positive for tingling. Negative for tremors, focal weakness and weakness.  Endo/Heme/Allergies: Negative.   Psychiatric/Behavioral: Positive for depression.  All other systems reviewed and are negative.  Otherwise per HPI.  Assessment & Plan: Visit Diagnoses:  1. Lumbar radiculopathy   2. Chronic pain syndrome   3. Neuropathy   4. Scoliosis of thoracolumbar spine, unspecified scoliosis type   5. Degenerative disc disease, lumbar   6. Fibromyalgia     Plan: Findings:  Chronic recalcitrant low back pain with referral to the hip and legs with history of disc changes at L5-S1 with some foraminal and lateral recess narrowing.  This is in conjunction with some scoliosis.  She also has history of prior right total knee replacement with continued right knee pain.  Case is complicated by coexistent fibromyalgia and possible neuropathy.  Both of these are followed by Dr. Estanislado Pandy in rheumatology and Dr. Kalman Shan in neurology.  She really has not had much relief of her back and hip pain with injections.  Radiofrequency ablation was performed with some relief and it was on multiple levels.  The relief however did not last very long.  Spinal cord stimulator trial was discussed with the patient at length today and we spent more than 30 minutes discussing the procedure the pros and  cons of this.  She would be a decent candidate but we did talk about how it would not relieve all of her pain complaints it may be less effective with a mechanical knee complaint.  We also talked about the fact that it probably would not help fibromyalgia type symptoms but it could be beneficial for neuropathy.  She has given [place] a written report from a physician assistant at the [facility] that she sees for psychiatry.  She is hopeful that this could be used as a pre-psychological evaluation for approval.  I do think she is a decent candidate at least for a trial stimulator.  But we will try to get approval for this through [place] using the notes from her psychiatrist.  If this were to be denied she likely would need to have formal neuropsychological evaluation and documentation.  The stimulator trial would be a procedure of last resort given the recalcitrant severe low back pain which is not really been amenable to any treatment at this point.    Meds & Orders: No orders of the defined types were placed in this encounter.  No orders of the defined types were placed in this encounter.   Follow-up: Return if symptoms worsen or fail to improve.   Procedures: No procedures performed  No notes on file   Clinical History: Lumbar CT myelogram 04/06/2016  IMPRESSION: 1. No acute or focal abnormality to explain the patient's thoracolumbar pain 2. Left paramedian calcified disc protrusion at T12-L1 without significant stenosis. 3. Mild osseous foraminal narrowing bilaterally at T10-11. 4. Mild bilateral foraminal narrowing at L1-2. 5. Mild left subarticular narrowing at L2-3 with moderate left and mild right foraminal stenosis. 6. Mild right subarticular narrowing at L3-4 with mild foraminal narrowing bilaterally, right greater than left. 7. Mild foraminal narrowing at L4-5 with moderate bilateral facet hypertrophy but no significant central canal stenosis. 8. Moderate facet  hypertrophy and spurring is worse on the left with mild left subarticular narrowing potentially impacting the left S1 nerve root.  Lumbar spine MRI 01/09/2016  L2-3: Mild retrolisthesis. Mild disc and facet degeneration. Mild foraminal narrowing bilaterally.  L3-4: Mild retrolisthesis. Mild disc and facet degeneration. Mild right foraminal narrowing.  L4-5:  Mild disc and facet degeneration without significant stenosis  L5-S1: Moderate to advanced disc degeneration with disc space narrowing. Mild endplate spurring without significant stenosis.  IMPRESSION: Lumbar disc and facet degeneration is stable since 07/20/2014. Negative for disc protrusion or significant spinal stenosis. Mild right foraminal narrowing at L3-4.  She reports that she quit smoking about 26 years ago. Her smoking use included cigarettes. She has a 6.00 pack-year smoking history. she has never used smokeless tobacco. No results for input(s): HGBA1C, LABURIC in the last 8760 hours.  Objective:  VS:  HT:    WT:   BMI:     BP:109/65  HR:(!) 58bpm  TEMP: ( )  RESP:  Physical Exam  Constitutional: She is oriented to person, place, and time. She appears well-developed and well-nourished. No distress.  Morbid obesity  HENT:  Head: Normocephalic and atraumatic.  Nose: Nose normal.  Mouth/Throat: Oropharynx is clear and moist.  Eyes: Conjunctivae are normal. Pupils are equal, round, and reactive to light.  Neck: Normal range of motion. Neck supple. No tracheal deviation present.  Cardiovascular: Regular rhythm and intact distal pulses.  Pulmonary/Chest: Effort normal. No respiratory distress. She has no wheezes.  Abdominal: Soft. She exhibits no distension. There is no guarding.  Musculoskeletal:  Patient is slow to arise from a seated position and does have pain with extension of the lumbar spine.  She is tender across the lower back and PSIS and greater trochanters but more of a superficial tenderness in the  muscles.  She clearly has some level of trigger points fascial pain as well as tender points.  She has no pain with hip rotation.  She has well-healed surgical scar on the right knee.  She has good distal strength without clonus.  Neurological: She is alert and oriented to person, place, and time. She exhibits normal muscle tone. Coordination normal.  Skin: Skin is warm. No rash noted. No erythema.  Psychiatric: She has a normal mood and affect. Her behavior is normal.  Nursing note and vitals reviewed.   Ortho Exam Imaging: No results found.  Past Medical/Family/Surgical/Social History: Medications & Allergies reviewed per EMR Patient Active Problem List   Diagnosis Date Noted  . Presence of right artificial knee joint 09/17/2016  . H/O total knee replacement, right 08/15/2016  . Presence of retained hardware   . Memory disorder 08/14/2016  . Cerebrovascular disease 08/14/2016  . Unilateral primary osteoarthritis, right knee 05/29/2016  . Primary osteoarthritis of both feet 05/29/2016  . Trochanteric bursitis of both hips 05/25/2016  . Neck pain 05/25/2016  . Urinary urgency 04/19/2016  . Chronic low back pain 01/11/2016  . Depression 11/29/2015  . 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 Heart palpitations 12/14/2013  . Generalized osteoarthritis of multiple sites 11/04/2013  . Cerebral infarction ([place]) 10/30/2011  . PFO (patent foramen ovale) 10/30/2011  . Bradycardia 10/30/2011  . Chest pain 10/30/2011  . Hypothyroid   . Thrombophlebitis   . Sleep apnea   . Fibromyalgia   . 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 Status post bariatric surgery 12/07/2010  . Vein disorder 11/29/2010  . S/P total hysterectomy and bilateral salpingo-oophorectomy 11/29/2010  . S/P exploratory laparotomy 11/29/2010  . S/P cholecystectomy 11/29/2010  . S/P ACL surgery 11/29/2010  . Morbid obesity ([place]) 11/10/2010   Past Medical History:  Diagnosis Date  . ADD (attention  deficit disorder)    takes Adderall daily  . Arthritis    "all over"  . Cerebral infarction ([place]) 10/30/2011  . Cerebrovascular disease 08/14/2016  . Chronic back pain    "all over"  . Chronic low back pain 01/11/2016  . Complication of anesthesia    tends to have hypotension when NPO and post-anesthesia  . Constipation    takes stool softener daily  . Degenerative disk disease   . Degenerative joint disease   . Depression    takes Cymbalta daily for pain per pt  . DVT (deep venous thrombosis) ([facility])    RLE  . Family history of adverse reaction to anesthesia    a family member woke up during surgery; "think it was my mom"  . Fibromyalgia   . Generalized osteoarthritis of multiple sites 11/04/2013  . GERD (gastroesophageal reflux disease)   . Heart palpitations 12/14/2013  . History of blood clots  superficial  . Hypoglycemia   . Hypothyroid    takes Synthroid daily  . Incomplete emptying of bladder   . Insomnia    takes Trazodone nightly  . Iron deficiency anemia    takes Ferrous Sulfate daily  . Joint pain   . Joint swelling    knees and ankles  . Memory disorder 08/14/2016  . Morbid obesity ([place])   . Nausea    takes Zofran as needed.Seeing GI doc  . Neck pain 05/25/2016  . OSA on CPAP    tested more than 5 yrs ago.    . Osteoarthritis   . PFO (patent foramen ovale)   . Primary osteoarthritis of both feet 05/29/2016   Right bunionectomy August 2017 by Dr. Sharol Given  . Scoliosis   . Sleep apnea   . Stroke [facility]) "several"   right foot weakness; memory issues, black spot right visual field since" (03/23/2015)  . Thrombophlebitis   . Trochanteric bursitis of both hips 05/25/2016  . Unilateral primary osteoarthritis, right knee 05/29/2016  . Urinary urgency 04/19/2016  . Vein disorder 11/29/2010   Family History  Problem Relation Age of Onset  . Heart disease Father   . Cancer Father   . Cancer Mother        skin cancer   . Heart disease Brother   . Cancer Brother   .  Diabetes Brother   . Stroke Brother   . Heart disease Sister   . Cancer Maternal Grandfather   . Hypothyroidism Daughter   . Colon cancer Neg Hx    Past Surgical History:  Procedure Laterality Date  . BUNIONECTOMY Right 08/2015  . CARDIAC CATHETERIZATION     2008.  "it was fine" (not sure why she had it done, and doesn't know where)  . COLONOSCOPY N/A 03/25/2013   Procedure: COLONOSCOPY;  Surgeon: Rogene Houston, MD;  [facility];  Service: Endoscopy;  Laterality: N/A;  930  . ESOPHAGOGASTRODUODENOSCOPY    . EXPLORATORY LAPAROTOMY     "took fallopian tubes out"  . JOINT REPLACEMENT     bil knee   . KNEE ARTHROPLASTY    . KNEE ARTHROSCOPY Left   . KNEE ARTHROSCOPY W/ ACL RECONSTRUCTION Right    "added pins"  . LAPAROSCOPIC CHOLECYSTECTOMY  ~ 2001  . 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 TOTAL KNEE ARTHROPLASTY Left 03/23/2015   Procedure: TOTAL KNEE ARTHROPLASTY;  Surgeon: Newt Minion, MD;  Location: [facility];  Service: Orthopedics;  Laterality: Left;  . TOTAL KNEE ARTHROPLASTY Right 08/15/2016   Procedure: RIGHT TOTAL KNEE ARTHROPLASTY, REMOVAL ACL SCREWS;  Surgeon: Newt Minion, MD;  Location: [facility];  Service: Orthopedics;  Laterality: Right;  . TOTAL KNEE ARTHROPLASTY WITH HARDWARE REMOVAL Right   . VAGINAL HYSTERECTOMY    . VARICOSE VEIN SURGERY Right X 2   Social History   Occupational History  . Occupation: Disability  . Occupation: formerly Therapist, sports, [employer]  Tobacco Use  . Smoking status: Former Smoker    Packs/day: 0.75    Years: 8.00    Pack years: 6.00    Types: Cigarettes    Last attempt to quit: 12/01/1990    Years since quitting: 26.1  . Smokeless tobacco: Never Used  . Tobacco comment: quit smoking in the 1990s  Substance and Sexual Activity  . Alcohol use: No    Comment: 03/23/2015 "stopped drinking in 2012 w/gastric bypass; drank socially before bypass"  . Drug use: No  . Sexual activity: Not  Currently    Birth control/protection: Surgical

## 2017-01-23 NOTE — Progress Notes (Signed)
Emailed to [employer].

## 2017-01-24 ENCOUNTER — Other Ambulatory Visit: Payer: Self-pay

## 2017-01-24 DIAGNOSIS — G4733 Obstructive sleep apnea (adult) (pediatric): Secondary | ICD-10-CM | POA: Diagnosis not present

## 2017-01-24 NOTE — Patient Outreach (Signed)
[facility]) Care Management  01/24/2017  Debbie Pineda 1960-06-23 [id number]   Telephone call to patient to follow up on [employer] referral. No answer.  HIPAA compliant voice message left.  Plan: RN CM will attempt patient again within 10 business days.  Jone Baseman, RN, MSN [facility] Care Management Care Management Coordinator Direct Line [phone] Toll Free: [phone]  Fax: [phone]

## 2017-01-25 ENCOUNTER — Other Ambulatory Visit: Payer: Self-pay

## 2017-01-25 ENCOUNTER — Telehealth (INDEPENDENT_AMBULATORY_CARE_PROVIDER_SITE_OTHER): Payer: Self-pay | Admitting: Specialist

## 2017-01-25 NOTE — Telephone Encounter (Signed)
Dr. Louanne Skye told patient to get a lift for one of her shoes because one of her legs is shorter, she cant remember which shoe to get it for, what kind (in the shoe or on the bottom of the shoe), and place to get it? She lives in [place] so preferably somewhere closer to there but doesn't mind coming to [facility] advise.

## 2017-01-25 NOTE — Patient Outreach (Signed)
[facility]) Care Management  01/25/2017  Debbie Pineda 1960/12/15 [id number]   Telephone call to patient for follow up from [employer] referral.  Patient able to verify HIPAA.  Discussed [employer] referral.  Patient reports that she is waiting for approval for spinal cord stimulator to help with pain. Patient reports that she has pain from her back and fibromyalgia.  Patient states it hinders her physical activity as it takes so much out of her.  She states she has the support of her spouse.  Patient wants to get off some of her medications and may need a review of her medication after her procedure.  Discussed with patient plan benefits, leaf center, and silver sneakers programs.  Patient requested link for the leaf center in [place].  Link requested to be sent to [email or web address].  Link sent to patient.  Patient requested additional information on silver sneakers.  Advised patient to contact [place] department.  She verbalized understanding and has the contact information.      Discussed with patient [place] Management services to patient.  Patient declined services until after her procedure.  Patient states she may want a medication reconciliation and medication discussion with the pharmacist.  Patient agreeable to receive brochure to call in the future.     Plan: RN CM will close case and notify care management assistant of case status.   RN CM will send letter and brochure.    Jone Baseman, RN, MSN [facility] Care Management Care Management Coordinator Direct Line [phone] Toll Free: [phone]  Fax: [phone]

## 2017-01-25 NOTE — Telephone Encounter (Signed)
Dr. Louanne Skye told patient to get a lift for one of her shoes because one of her legs is shorter, she cant remember which shoe to get it for, what kind (in the shoe or on the bottom of the shoe), and place to get it? She lives in [place] so preferably somewhere closer to there but doesn't mind coming to [place]. Please call her to advise and you can leave a voicemail # [phone].

## 2017-01-26 ENCOUNTER — Ambulatory Visit
Admission: RE | Admit: 2017-01-26 | Discharge: 2017-01-26 | Disposition: A | Discharge status: Home / Self Care | Payer: PPO | Source: Ambulatory Visit | Attending: Neurology | Admitting: Neurology

## 2017-01-26 DIAGNOSIS — R413 Other amnesia: Secondary | ICD-10-CM | POA: Diagnosis not present

## 2017-01-27 ENCOUNTER — Telehealth: Payer: Self-pay | Admitting: Neurology

## 2017-01-27 NOTE — Telephone Encounter (Signed)
I called the patient.  MRI of the brain shows the old left posterior cerebral artery distribution stroke, no new changes noted.  I suspect that the memory issues are related to the old stroke in addition to multiple psychoactive medications that she is on include trazodone, tramadol, Topamax, oxybutynin, and oxycodone.  Getting off some of these medications may help her function better cognitively.     MRI brain 01/26/17:  IMPRESSION:  This MRI of the brain without contrast shows the following: 1.     Remote stroke in the left posterior medial temporal lobe and left medial occipital lobe in the distribution of the left posterior cerebral artery. The stroke was acute on the 10/31/2011 MRI.  2.     Small lacunar infarction in the right cerebellar hemisphere that was present on the 2013 MRI and small lacunar infarction in the left cerebellar hemisphere that was not present on the older MRI. 3.     Brain volume is normal for age and there is no significant chronic microvascular ischemic change. 4.     There are no acute findings.

## 2017-01-30 DIAGNOSIS — H5213 Myopia, bilateral: Secondary | ICD-10-CM | POA: Diagnosis not present

## 2017-01-30 DIAGNOSIS — H524 Presbyopia: Secondary | ICD-10-CM | POA: Diagnosis not present

## 2017-01-30 DIAGNOSIS — H04123 Dry eye syndrome of bilateral lacrimal glands: Secondary | ICD-10-CM | POA: Diagnosis not present

## 2017-01-30 NOTE — Telephone Encounter (Signed)
Her right leg is the shorter by about 8 mm so a 1/4 inch lift should be adequate, about the same as placing a store bought shoe lift in the right shoe. jen

## 2017-01-31 ENCOUNTER — Encounter (INDEPENDENT_AMBULATORY_CARE_PROVIDER_SITE_OTHER): Payer: Self-pay | Admitting: Internal Medicine

## 2017-01-31 DIAGNOSIS — F909 Attention-deficit hyperactivity disorder, unspecified type: Secondary | ICD-10-CM | POA: Diagnosis not present

## 2017-02-01 DIAGNOSIS — I6350 Cerebral infarction due to unspecified occlusion or stenosis of unspecified cerebral artery: Secondary | ICD-10-CM | POA: Diagnosis not present

## 2017-02-01 DIAGNOSIS — Z1389 Encounter for screening for other disorder: Secondary | ICD-10-CM | POA: Diagnosis not present

## 2017-02-01 DIAGNOSIS — G894 Chronic pain syndrome: Secondary | ICD-10-CM | POA: Diagnosis not present

## 2017-02-01 DIAGNOSIS — M797 Fibromyalgia: Secondary | ICD-10-CM | POA: Diagnosis not present

## 2017-02-01 DIAGNOSIS — Z6834 Body mass index (BMI) 34.0-34.9, adult: Secondary | ICD-10-CM | POA: Diagnosis not present

## 2017-02-01 DIAGNOSIS — G4730 Sleep apnea, unspecified: Secondary | ICD-10-CM | POA: Diagnosis not present

## 2017-02-01 DIAGNOSIS — I10 Essential (primary) hypertension: Secondary | ICD-10-CM | POA: Diagnosis not present

## 2017-02-01 DIAGNOSIS — E6609 Other obesity due to excess calories: Secondary | ICD-10-CM | POA: Diagnosis not present

## 2017-02-01 DIAGNOSIS — E781 Pure hyperglyceridemia: Secondary | ICD-10-CM | POA: Diagnosis not present

## 2017-02-01 DIAGNOSIS — Z7901 Long term (current) use of anticoagulants: Secondary | ICD-10-CM | POA: Diagnosis not present

## 2017-02-01 DIAGNOSIS — M1991 Primary osteoarthritis, unspecified site: Secondary | ICD-10-CM | POA: Diagnosis not present

## 2017-02-01 DIAGNOSIS — Z9884 Bariatric surgery status: Secondary | ICD-10-CM | POA: Diagnosis not present

## 2017-02-01 NOTE — Telephone Encounter (Signed)
I called and advised her on Dr. Otho Ket message below.

## 2017-02-06 DIAGNOSIS — F909 Attention-deficit hyperactivity disorder, unspecified type: Secondary | ICD-10-CM | POA: Diagnosis not present

## 2017-02-12 DIAGNOSIS — F909 Attention-deficit hyperactivity disorder, unspecified type: Secondary | ICD-10-CM | POA: Diagnosis not present

## 2017-02-13 DIAGNOSIS — F909 Attention-deficit hyperactivity disorder, unspecified type: Secondary | ICD-10-CM | POA: Diagnosis not present

## 2017-02-14 DIAGNOSIS — H5213 Myopia, bilateral: Secondary | ICD-10-CM | POA: Diagnosis not present

## 2017-02-14 DIAGNOSIS — H524 Presbyopia: Secondary | ICD-10-CM | POA: Diagnosis not present

## 2017-02-14 DIAGNOSIS — H53461 Homonymous bilateral field defects, right side: Secondary | ICD-10-CM | POA: Diagnosis not present

## 2017-02-14 DIAGNOSIS — H52221 Regular astigmatism, right eye: Secondary | ICD-10-CM | POA: Diagnosis not present

## 2017-02-15 ENCOUNTER — Telehealth (INDEPENDENT_AMBULATORY_CARE_PROVIDER_SITE_OTHER): Payer: Self-pay | Admitting: Physical Medicine and Rehabilitation

## 2017-02-19 DIAGNOSIS — H5340 Unspecified visual field defects: Secondary | ICD-10-CM | POA: Diagnosis not present

## 2017-02-19 DIAGNOSIS — H53461 Homonymous bilateral field defects, right side: Secondary | ICD-10-CM | POA: Diagnosis not present

## 2017-02-21 ENCOUNTER — Other Ambulatory Visit (INDEPENDENT_AMBULATORY_CARE_PROVIDER_SITE_OTHER): Payer: Self-pay | Admitting: Physical Medicine and Rehabilitation

## 2017-02-21 MED ORDER — DIAZEPAM 10 MG PO TABS: 10.0000 mg | ORAL_TABLET | Freq: Once | ORAL | 0 refills | Status: DC | PRN

## 2017-02-21 NOTE — Telephone Encounter (Signed)
rx ordered and printed

## 2017-02-21 NOTE — Telephone Encounter (Signed)
Faxed to patient's pharmacy.  

## 2017-02-24 DIAGNOSIS — G4733 Obstructive sleep apnea (adult) (pediatric): Secondary | ICD-10-CM | POA: Diagnosis not present

## 2017-02-25 DIAGNOSIS — F909 Attention-deficit hyperactivity disorder, unspecified type: Secondary | ICD-10-CM | POA: Diagnosis not present

## 2017-02-26 DIAGNOSIS — Z1283 Encounter for screening for malignant neoplasm of skin: Secondary | ICD-10-CM | POA: Diagnosis not present

## 2017-02-26 DIAGNOSIS — C44519 Basal cell carcinoma of skin of other part of trunk: Secondary | ICD-10-CM | POA: Diagnosis not present

## 2017-02-26 DIAGNOSIS — L720 Epidermal cyst: Secondary | ICD-10-CM | POA: Diagnosis not present

## 2017-02-28 ENCOUNTER — Telehealth: Payer: Self-pay | Admitting: Neurology

## 2017-02-28 DIAGNOSIS — E782 Mixed hyperlipidemia: Secondary | ICD-10-CM | POA: Diagnosis not present

## 2017-02-28 DIAGNOSIS — Z6834 Body mass index (BMI) 34.0-34.9, adult: Secondary | ICD-10-CM | POA: Diagnosis not present

## 2017-02-28 DIAGNOSIS — E6609 Other obesity due to excess calories: Secondary | ICD-10-CM | POA: Diagnosis not present

## 2017-02-28 DIAGNOSIS — R195 Other fecal abnormalities: Secondary | ICD-10-CM | POA: Diagnosis not present

## 2017-02-28 DIAGNOSIS — R109 Unspecified abdominal pain: Secondary | ICD-10-CM | POA: Diagnosis not present

## 2017-02-28 DIAGNOSIS — M79644 Pain in right finger(s): Secondary | ICD-10-CM | POA: Diagnosis not present

## 2017-02-28 MED ORDER — CEREFOLIN 6-1-50-5 MG PO TABS: 1.0000 | ORAL_TABLET | Freq: Every day | ORAL | 3 refills | Status: DC

## 2017-02-28 NOTE — Telephone Encounter (Signed)
The prescription was refilled

## 2017-02-28 NOTE — Telephone Encounter (Signed)
L-Methylfolate-B12-B6-B2 (CEREFOLIN) 06-22-48-5 MG TABS  Sent to the [facility] [address]. Pt is wanting to know if she needs it dosage up to the plus or not

## 2017-03-04 ENCOUNTER — Ambulatory Visit (HOSPITAL_COMMUNITY)
Admission: RE | Admit: 2017-03-04 | Discharge: 2017-03-04 | Disposition: A | Discharge status: Home / Self Care | Payer: PPO | Source: Ambulatory Visit | Attending: Physician Assistant | Admitting: Physician Assistant

## 2017-03-04 ENCOUNTER — Other Ambulatory Visit (HOSPITAL_COMMUNITY): Payer: Self-pay | Admitting: Physician Assistant

## 2017-03-04 DIAGNOSIS — K838 Other specified diseases of biliary tract: Secondary | ICD-10-CM | POA: Insufficient documentation

## 2017-03-04 DIAGNOSIS — Z9049 Acquired absence of other specified parts of digestive tract: Secondary | ICD-10-CM | POA: Insufficient documentation

## 2017-03-04 DIAGNOSIS — R748 Abnormal levels of other serum enzymes: Secondary | ICD-10-CM

## 2017-03-04 DIAGNOSIS — R194 Change in bowel habit: Secondary | ICD-10-CM | POA: Insufficient documentation

## 2017-03-04 DIAGNOSIS — Z9884 Bariatric surgery status: Secondary | ICD-10-CM | POA: Insufficient documentation

## 2017-03-04 DIAGNOSIS — R7989 Other specified abnormal findings of blood chemistry: Secondary | ICD-10-CM | POA: Diagnosis not present

## 2017-03-04 MED ORDER — IOPAMIDOL (ISOVUE-300) INJECTION 61%
100.0000 mL | Freq: Once | INTRAVENOUS | Status: AC | PRN
  Administered 2017-03-04: 100 mL via INTRAVENOUS

## 2017-03-05 DIAGNOSIS — R7309 Other abnormal glucose: Secondary | ICD-10-CM | POA: Diagnosis not present

## 2017-03-05 DIAGNOSIS — E6609 Other obesity due to excess calories: Secondary | ICD-10-CM | POA: Diagnosis not present

## 2017-03-05 DIAGNOSIS — F909 Attention-deficit hyperactivity disorder, unspecified type: Secondary | ICD-10-CM | POA: Diagnosis not present

## 2017-03-06 ENCOUNTER — Emergency Department (HOSPITAL_COMMUNITY)
Admission: EM | Admit: 2017-03-06 | Discharge: 2017-03-06 | Disposition: A | Discharge status: Home / Self Care | Payer: PPO | Attending: Emergency Medicine | Admitting: Emergency Medicine

## 2017-03-06 ENCOUNTER — Encounter (HOSPITAL_COMMUNITY): Payer: Self-pay

## 2017-03-06 ENCOUNTER — Other Ambulatory Visit: Payer: Self-pay

## 2017-03-06 ENCOUNTER — Emergency Department (HOSPITAL_COMMUNITY): Payer: PPO

## 2017-03-06 ENCOUNTER — Telehealth (INDEPENDENT_AMBULATORY_CARE_PROVIDER_SITE_OTHER): Payer: Self-pay | Admitting: *Deleted

## 2017-03-06 DIAGNOSIS — Z79899 Other long term (current) drug therapy: Secondary | ICD-10-CM | POA: Insufficient documentation

## 2017-03-06 DIAGNOSIS — R935 Abnormal findings on diagnostic imaging of other abdominal regions, including retroperitoneum: Secondary | ICD-10-CM

## 2017-03-06 DIAGNOSIS — E039 Hypothyroidism, unspecified: Secondary | ICD-10-CM | POA: Diagnosis not present

## 2017-03-06 DIAGNOSIS — R1010 Upper abdominal pain, unspecified: Secondary | ICD-10-CM | POA: Insufficient documentation

## 2017-03-06 DIAGNOSIS — R1012 Left upper quadrant pain: Secondary | ICD-10-CM | POA: Diagnosis not present

## 2017-03-06 DIAGNOSIS — R799 Abnormal finding of blood chemistry, unspecified: Secondary | ICD-10-CM | POA: Diagnosis present

## 2017-03-06 DIAGNOSIS — R7989 Other specified abnormal findings of blood chemistry: Secondary | ICD-10-CM | POA: Diagnosis not present

## 2017-03-06 DIAGNOSIS — Z87891 Personal history of nicotine dependence: Secondary | ICD-10-CM | POA: Insufficient documentation

## 2017-03-06 DIAGNOSIS — R932 Abnormal findings on diagnostic imaging of liver and biliary tract: Secondary | ICD-10-CM | POA: Diagnosis not present

## 2017-03-06 LAB — COMPREHENSIVE METABOLIC PANEL
ALT: 177 U/L — ABNORMAL HIGH (ref 14–54)
AST: 34 U/L (ref 15–41)
Albumin: 4 g/dL (ref 3.5–5.0)
Alkaline Phosphatase: 136 U/L — ABNORMAL HIGH (ref 38–126)
Anion gap: 9 (ref 5–15)
BUN: 13 mg/dL (ref 6–20)
CO2: 24 mmol/L (ref 22–32)
Calcium: 9.2 mg/dL (ref 8.9–10.3)
Chloride: 104 mmol/L (ref 101–111)
Creatinine, Ser: 0.84 mg/dL (ref 0.44–1.00)
GFR calc Af Amer: >60 mL/min (ref >60)
GFR calc non Af Amer: >60 mL/min (ref >60)
Glucose, Bld: 143 mg/dL — ABNORMAL HIGH (ref 65–99)
Potassium: 3.6 mmol/L (ref 3.5–5.1)
Sodium: 137 mmol/L (ref 135–145)
Total Bilirubin: 0.4 mg/dL (ref 0.3–1.2)
Total Protein: 6.7 g/dL (ref 6.5–8.1)

## 2017-03-06 LAB — CBC
HCT: 39 % (ref 36.0–46.0)
Hemoglobin: 12.5 g/dL (ref 12.0–15.0)
MCH: 32.5 pg (ref 26.0–34.0)
MCHC: 32.1 g/dL (ref 30.0–36.0)
MCV: 101.3 fL — ABNORMAL HIGH (ref 78.0–100.0)
Platelets: 237 10*3/uL (ref 150–400)
RBC: 3.85 MIL/uL — ABNORMAL LOW (ref 3.87–5.11)
RDW: 12.6 % (ref 11.5–15.5)
WBC: 7.1 10*3/uL (ref 4.0–10.5)

## 2017-03-06 LAB — URINALYSIS, ROUTINE W REFLEX MICROSCOPIC
Bilirubin Urine: NEGATIVE
Glucose, UA: NEGATIVE mg/dL
Hgb urine dipstick: NEGATIVE
Ketones, ur: NEGATIVE mg/dL
Leukocytes, UA: NEGATIVE
Nitrite: NEGATIVE
Protein, ur: NEGATIVE mg/dL
Specific Gravity, Urine: 1.009 (ref 1.005–1.030)
pH: 7 (ref 5.0–8.0)

## 2017-03-06 LAB — LIPASE, BLOOD: Lipase: 26 U/L (ref 11–51)

## 2017-03-06 MED ORDER — GADOBENATE DIMEGLUMINE 529 MG/ML IV SOLN
20.0000 mL | Freq: Once | INTRAVENOUS | Status: AC | PRN
  Administered 2017-03-06: 20 mL via INTRAVENOUS

## 2017-03-06 MED ORDER — SUMATRIPTAN SUCCINATE 50 MG PO TABS: 50.0000 mg | ORAL_TABLET | ORAL | 0 refills | Status: DC | PRN

## 2017-03-06 MED ORDER — FENTANYL CITRATE (PF) 100 MCG/2ML IJ SOLN
100.0000 ug | Freq: Once | INTRAMUSCULAR | Status: AC
  Administered 2017-03-06: 100 ug via INTRAVENOUS
  Filled 2017-03-06: qty 2

## 2017-03-06 NOTE — Telephone Encounter (Signed)
A message was left on voicemail late yesterday stating the following -  Has been seen at [facility] for the following . Blood in Stool , Left Quadrant Pain , Elevated Liver Enzymes. A diagnostic was done and she was told that it was okay. She was told that she needed to see Dr.Rehman and they would fax notes, lab , ect to [place].  This morning the patient called the office leaving a message. She states that she had been up all night with horrible ,(bad) , pain Left Abdomen. Unsure if it is gas or referred back pain. Patient had been crying.  This was addressed with Raeanne Gathers prior to a return call to the patient.  Terri called the patient and advised her to go to the ED with her symptoms. Per Terri the patient agreed to do so.  [facility] was called and a message left requesting that her recent information be sent to [place] .

## 2017-03-06 NOTE — ED Notes (Signed)
Pt given IV Fentanyl, does not have a driver, will be placed in hallway until Husband can come. Pt made aware she cannot drive d/t medication received. Charge RN made aware.

## 2017-03-06 NOTE — Telephone Encounter (Signed)
Patient is going to the ED 

## 2017-03-06 NOTE — ED Notes (Signed)
PT left with her husband at this time.

## 2017-03-06 NOTE — ED Triage Notes (Signed)
Patient received call from GI specialist for elevated LFT over 1000. Patient stated provider wanted patient to have MRI for concern common bile duct stone. Patient complains of left sided abdominal pain. Denies fevers.

## 2017-03-06 NOTE — ED Provider Notes (Signed)
[facility] EMERGENCY DEPARTMENT Provider Note   CSN: [id number] Arrival date & time: 03/06/17  1127     History   Chief Complaint Chief Complaint  Patient presents with  . Abnormal Lab    HPI MIRENDA BALTAZAR is a 57 y.o. female.  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 Sent here from PCP office b/c of the abnormal of the labs and ongoing pain. Hx of chole in the past.  CT done 2 days ago - mild diffuse ductal dilation. Sx are constant, mild to moderate - located in the mid to LU abdomen.  Nothing makes better or worse   Past Medical History:  Diagnosis Date  . ADD (attention deficit disorder)    takes Adderall daily  . Arthritis    "all over"  . [place]erebral [place] ([place] 10/30/2011  . Cerebrovascular disease 08/14/2016  . Chronic back pain    "all over"  . Chronic low back pain 01/11/2016  . Complication of anesthesia    tends to have hypotension when NPO and post-anesthesia  . Constipation    takes stool softener daily  . Degenerative disk disease   . Degenerative joint disease   . Depression    takes Cymbalta daily for pain per pt  . DVT (deep venous thrombosis) (HCC)    RLE  . Family history of adverse reaction to anesthesia    a family member woke up during surgery; "think it was my mom"  . Fibromyalgia   . Generalized osteoarthritis of multiple sites 11/04/2013  . GERD (gastroesophageal reflux disease)   . Heart palpitations 12/14/2013  . History of blood clots    superficial  . Hypoglycemia   . Hypothyroid    takes Synthroid daily  . Incomplete emptying of bladder   . Insomnia    takes Trazodone nightly  . Iron deficiency anemia    takes Ferrous Sulfate daily  . Joint pain   . Joint swelling    knees and ankles  . Memory disorder 08/14/2016  . Morbid obesity ([place]    . Nausea    takes Zofran as needed.Seeing GI doc  . Neck pain 05/25/2016  . OSA on CPAP    tested more than 5 yrs ago.    . Osteoarthritis   . PFO (patent foramen ovale)   . Primary osteoarthritis of both feet 05/29/2016   Right bunionectomy August 2017 by Dr. DSharol Given . Scoliosis   . Sleep apnea   . Stroke ([facility] "several"   right foot weakness; memory issues, black spot right visual field since" (03/23/2015)  . Thrombophlebitis   . Trochanteric bursitis of both hips 05/25/2016  . Unilateral primary osteoarthritis, right knee 05/29/2016  . Urinary urgency 04/19/2016  . Vein disorder 11/29/2010    Patient Active Problem List   Diagnosis Date Noted  . Presence of right artificial knee joint 09/17/2016  . H/O total knee replacement, right 08/15/2016  . Presence of retained hardware   . Memory disorder 08/14/2016  . Cerebrovascular disease 08/14/2016  . Unilateral primary osteoarthritis, right knee 05/29/2016  . Primary osteoarthritis of both feet 05/29/2016  . Trochanteric bursitis of both hips 05/25/2016  . Neck pain 05/25/2016  . Urinary urgency 04/19/2016  . Chronic low back pain 01/11/2016  . Depression 11/29/2015  . Total  knee replacement status 03/23/2015  . Heart palpitations 12/14/2013  . Generalized osteoarthritis of multiple sites 11/04/2013  . Cerebral infarction ([place]) 10/30/2011  . PFO (patent foramen ovale) 10/30/2011  . Bradycardia 10/30/2011  . Chest pain 10/30/2011  . Hypothyroid   . Thrombophlebitis   . Sleep apnea   . Fibromyalgia   . 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 Status post bariatric surgery 12/07/2010  . Vein disorder 11/29/2010  . S/P total hysterectomy and bilateral salpingo-oophorectomy 11/29/2010  . S/P exploratory laparotomy 11/29/2010  . S/P cholecystectomy 11/29/2010  . S/P ACL surgery 11/29/2010  . Morbid obesity ([place]) 11/10/2010    Past Surgical History:  Procedure Laterality Date  . BUNIONECTOMY Right 08/2015  . CARDIAC  CATHETERIZATION     2008.  "it was fine" (not sure why she had it done, and doesn't know where)  . COLONOSCOPY N/A 03/25/2013   Procedure: COLONOSCOPY;  Surgeon: Rogene [place], MD;  [facility];  Service: Endoscopy;  Laterality: N/A;  930  . ESOPHAGOGASTRODUODENOSCOPY    . EXPLORATORY LAPAROTOMY     "took fallopian tubes out"  . JOINT REPLACEMENT     bil knee   . KNEE ARTHROPLASTY    . KNEE ARTHROSCOPY Left   . KNEE ARTHROSCOPY W/ ACL RECONSTRUCTION Right    "added pins"  . LAPAROSCOPIC CHOLECYSTECTOMY  ~ 2001  . 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 TOTAL KNEE ARTHROPLASTY Left 03/23/2015   Procedure: TOTAL KNEE ARTHROPLASTY;  Surgeon: Newt Minion, MD;  Location: [place];  Service: Orthopedics;  Laterality: Left;  . TOTAL KNEE ARTHROPLASTY Right 08/15/2016   Procedure: RIGHT TOTAL KNEE ARTHROPLASTY, REMOVAL ACL SCREWS;  Surgeon: Newt Minion, MD;  Location: [place];  Service: Orthopedics;  Laterality: Right;  . TOTAL KNEE ARTHROPLASTY WITH HARDWARE REMOVAL Right   . VAGINAL HYSTERECTOMY    . VARICOSE VEIN SURGERY Right X 2    OB History    No data available       Home Medications    Prior to Admission medications   Medication Sig Start Date End Date Taking? Authorizing Provider  baclofen (LIORESAL) 10 MG tablet Take 10 mg by mouth 3 (three) times daily.  12/19/16  Yes [provider]  Cyanocobalamin (B-12) 2500 MCG SUBL Place 2,500 mcg under the tongue daily.   Yes [provider]  diclofenac sodium (VOLTAREN) 1 % GEL Apply 4 g topically 4 (four) times daily as needed (PAIN). Reported on 03/21/2015   Yes [provider]  donepezil (ARICEPT) 5 MG tablet Take 1 tablet (5 mg total) by mouth at bedtime. 01/10/17  Yes [name], MD  DULoxetine (CYMBALTA) 60 MG capsule Take 60 mg by mouth 2 (two) times daily. 01/17/15  Yes [provider]  ferrous sulfate 325 (65 FE) MG tablet Take 325 mg by mouth daily at 12 noon.   Yes  [provider]  folic acid (FOLVITE) 245 MCG tablet Take 400 mcg by mouth daily.   Yes [provider]  gabapentin (NEURONTIN) 600 MG tablet Take 600 mg by mouth 3 (three) times daily.   Yes [provider]  INTRAROSA 6.5 MG INST Place 1 suppository vaginally at bedtime.  11/30/16  Yes [provider]  Javier Docker Oil 350 MG CAPS Take 350 mg by mouth at bedtime.   Yes [provider]  L-Methylfolate-B12-B6-B2 (CEREFOLIN) 06-22-48-5 MG TABS Take 1 tablet by mouth daily. 02/28/17  Yes [name], MD  levothyroxine (  SYNTHROID, LEVOTHROID) 125 MCG tablet Take 125 mcg by mouth daily before breakfast.   Yes [provider]  methocarbamol (ROBAXIN) 750 MG tablet TAKE 1 TABLET BY MOUTH THREE TIMES DAILY 07/31/16  Yes [name], [name], MD  methylphenidate (RITALIN) 20 MG tablet Take 20 mg by mouth 3 (three) times daily. Takes at 0800, 1200, 1600   Yes [provider]  Multiple Minerals-Vitamins (CAL-MAG-ZINC-D PO) Take 1-2 tablets by mouth See admin instructions. Take 2 tablet at 1200 and 1 tablet at 1600  Calcium 1000 mg Vit D 600 iu Magnesium 400 mg Zinc 15 mg   Yes [provider]  Multiple Vitamin (MULTIVITAMIN WITH MINERALS) TABS tablet Take 1 tablet by mouth 2 (two) times daily. One a day petites   Yes [provider]  NARCAN 4 MG/0.1ML LIQD nasal spray kit as needed. 11/14/16  Yes [provider]  omeprazole (PRILOSEC) 40 MG capsule Take 1 capsule by mouth twice daily. 10/04/16  Yes Setzer, Terri L, NP  ondansetron (ZOFRAN-ODT) 4 MG disintegrating tablet Take 4 mg by mouth every 6 (six) hours as needed for nausea.  03/02/15  Yes [provider]  oxybutynin (DITROPAN-XL) 10 MG 24 hr tablet Take 10 mg by mouth daily.   Yes [provider]  Oxycodone HCl 10 MG TABS TAKE 1 TYABLET BY MOUTH EVERY 4-6 HOURS AS NEEDED FOR PAIN 03/05/15  Yes [provider]  polyethylene glycol powder (MIRALAX)  powder Take 17 g by mouth daily.    Yes [provider]  Pseudoephedrine HCl (SUDAFED PO) Take 1-2 tablets by mouth daily as needed (for allergies-congestion).    Yes [provider]  rivaroxaban (XARELTO) 20 MG TABS tablet Take 1 tablet (20 mg total) by mouth daily with supper. Patient taking differently: Take 20 mg by mouth at bedtime.  03/25/15  Yes Newt Minion, MD  topiramate (TOPAMAX) 25 MG tablet Take 25 mg by mouth 2 (two) times daily.   Yes [provider]  traMADol (ULTRAM) 50 MG tablet Take 50 mg by mouth 4 (four) times daily.  10/13/15  Yes [provider]  traZODone (DESYREL) 100 MG tablet Take 100-300 mg by mouth at bedtime as needed for sleep (depends on insomnia if takes 1-3 tablets).    Yes [provider]  vitamin C (ASCORBIC ACID) 500 MG tablet Take 500 mg by mouth daily.   Yes [provider]  Vitamin D, Ergocalciferol, (DRISDOL) 50000 UNITS CAPS Take 50,000 Units by mouth every 7 (seven) days. Mondays   Yes [provider]  acetaminophen (TYLENOL) 325 MG tablet Take 325-650 mg by mouth daily as needed (for pain).     [provider]  diazepam (VALIUM) 10 MG tablet Take 1 tablet (10 mg total) by mouth once as needed for up to 1 dose for anxiety. Please 1 hour prior to procedure 02/21/17   Magnus Sinning, MD  SUMAtriptan (IMITREX) 50 MG tablet Take 1 tablet (50 mg total) by mouth every 2 (two) hours as needed for migraine (ongoing headache). Maximum daily dose - 2 tablets 03/06/17   Noemi Chapel, MD  UNABLE TO FIND Bilateral Knee high 20-30 mmHg compression stockings. 08/08/16   Vickie Epley, MD    Family History Family History  Problem Relation Age of Onset  . Heart disease Father   . Cancer Father   . Cancer Mother        skin cancer   . Heart disease Brother   . Cancer Brother   .  Diabetes Brother   . Stroke Brother   . Heart disease Sister   . Cancer Maternal Grandfather   . Hypothyroidism  Daughter   . Colon cancer Neg Hx     Social History Social History   Tobacco Use  . Smoking status: Former Smoker    Packs/day: 0.75    Years: 8.00    Pack years: 6.00    Types: Cigarettes    Last attempt to quit: 12/01/1990    Years since quitting: 26.2  . Smokeless tobacco: Never Used  . Tobacco comment: quit smoking in the 1990s  Substance Use Topics  . Alcohol use: No    Comment: 03/23/2015 "stopped drinking in 2012 w/gastric bypass; drank socially before bypass"  . Drug use: No     Allergies   Lyrica [pregabalin]; Flexeril [cyclobenzaprine hcl]; Morphine and related; Sulfamethoxazole-trimethoprim; Belsomra [suvorexant]; and Tape   Review of Systems Review of Systems  All other systems reviewed and are negative.    Physical Exam Updated Vital Signs BP 119/68   Pulse 66   Temp 98.4 F (36.9 C)   Resp 16   Ht 5' 7" (1.702 m)   Wt 95.3 kg (210 lb)   SpO2 97%   BMI 32.89 kg/m   Physical Exam  Constitutional: She appears well-developed and well-nourished. No distress.  HENT:  Head: Normocephalic and atraumatic.  Mouth/Throat: Oropharynx is clear and moist. No oropharyngeal exudate.  Eyes: Conjunctivae and EOM are normal. Pupils are equal, round, and reactive to light. Right eye exhibits no discharge. Left eye exhibits no discharge. No scleral icterus.  Neck: Normal range of motion. Neck supple. No JVD present. No thyromegaly present.  Cardiovascular: Normal rate, regular rhythm, normal heart sounds and intact distal pulses. Exam reveals no gallop and no friction rub.  No murmur heard. Pulmonary/Chest: Effort normal and breath sounds normal. No respiratory distress. She has no wheezes. She has no rales.  Abdominal: Soft. Bowel sounds are normal. She exhibits no distension and no mass. There is tenderness ( mild mid abd and L upper abd ttp).  Musculoskeletal: Normal range of motion. She exhibits no edema or tenderness.  Lymphadenopathy:    She has no cervical  adenopathy.  Neurological: She is alert. Coordination normal.  Skin: Skin is warm and dry. No rash noted. No erythema.  Psychiatric: She has a normal mood and affect. Her behavior is normal.  Nursing note and vitals reviewed.    ED Treatments / Results  Labs (all labs ordered are listed, but only abnormal results are displayed) Labs Reviewed  COMPREHENSIVE METABOLIC PANEL - Abnormal; Notable for the following components:      Result Value   Glucose, Bld 143 (*)    ALT 177 (*)    Alkaline Phosphatase 136 (*)    All other components within normal limits  CBC - Abnormal; Notable for the following components:   RBC 3.85 (*)    MCV 101.3 (*)    All other components within normal limits  LIPASE, BLOOD  URINALYSIS, ROUTINE W REFLEX MICROSCOPIC     Radiology Mr 3d Recon At Scanner  Result Date: 03/06/2017 CLINICAL DATA:  Elevated LFTs.  Abdominal pain. EXAM: MRI ABDOMEN WITHOUT AND WITH CONTRAST (INCLUDING MRCP) TECHNIQUE: Multiplanar multisequence MR imaging of the abdomen was performed both before and after the administration of intravenous contrast. Heavily T2-weighted images of the biliary and pancreatic ducts were obtained, and three-dimensional MRCP images were rendered by post processing. CONTRAST:  62m MULTIHANCE GADOBENATE DIMEGLUMINE 529 MG/ML  IV SOLN COMPARISON:  CT 03/04/2017. FINDINGS: Lower chest: No acute findings. Hepatobiliary: No focal liver abnormality identified. Normal signal and morphology. Previous cholecystectomy. Fusiform dilatation of the common bile duct measures 11 mm. No choledocholithiasis or obstructing mass identified. Pancreas: No significant inflammation. No main duct dilatation or mass identified. Spleen:  Within normal limits in size and appearance. Adrenals/Urinary Tract: The adrenal glands are both normal. No hydronephrosis or kidney mass identified bilaterally. Stomach/Bowel: Postop changes involving the stomach are identified is reflecting previous  Roux-en-Y gastric bypass. The visualized upper abdominal bowel loops are unremarkable. Vascular/Lymphatic: Normal appearance of the abdominal aorta. No abdominal adenopathy identified. Other:  None. Musculoskeletal: Mild curvature of the lumbar spine is convex towards the right. IMPRESSION: 1. No acute findings.  No explanation for patient's elevated LFTs. 2. Fusiform dilatation of the common bile duct status post cholecystectomy. No choledocholithiasis or mass identified. Electronically Signed   By: Kerby Moors M.D.   On: 03/06/2017 15:59   Mr Abdomen Mrcp Moise Boring Contast  Result Date: 03/06/2017 CLINICAL DATA:  Elevated LFTs.  Abdominal pain. EXAM: MRI ABDOMEN WITHOUT AND WITH CONTRAST (INCLUDING MRCP) TECHNIQUE: Multiplanar multisequence MR imaging of the abdomen was performed both before and after the administration of intravenous contrast. Heavily T2-weighted images of the biliary and pancreatic ducts were obtained, and three-dimensional MRCP images were rendered by post processing. CONTRAST:  72m MULTIHANCE GADOBENATE DIMEGLUMINE 529 MG/ML IV SOLN COMPARISON:  CT 03/04/2017. FINDINGS: Lower chest: No acute findings. Hepatobiliary: No focal liver abnormality identified. Normal signal and morphology. Previous cholecystectomy. Fusiform dilatation of the common bile duct measures 11 mm. No choledocholithiasis or obstructing mass identified. Pancreas: No significant inflammation. No main duct dilatation or mass identified. Spleen:  Within normal limits in size and appearance. Adrenals/Urinary Tract: The adrenal glands are both normal. No hydronephrosis or kidney mass identified bilaterally. Stomach/Bowel: Postop changes involving the stomach are identified is reflecting previous Roux-en-Y gastric bypass. The visualized upper abdominal bowel loops are unremarkable. Vascular/Lymphatic: Normal appearance of the abdominal aorta. No abdominal adenopathy identified. Other:  None. Musculoskeletal: Mild curvature of  the lumbar spine is convex towards the right. IMPRESSION: 1. No acute findings.  No explanation for patient's elevated LFTs. 2. Fusiform dilatation of the common bile duct status post cholecystectomy. No choledocholithiasis or mass identified. Electronically Signed   By: TKerby MoorsM.D.   On: 03/06/2017 15:59    Procedures Procedures (including critical care time)  Medications Ordered in ED Medications  fentaNYL (SUBLIMAZE) injection 100 mcg (100 mcg Intravenous Given 03/06/17 1536)  gadobenate dimeglumine (MULTIHANCE) injection 20 mL (20 mLs Intravenous Contrast Given 03/06/17 1545)     Initial Impression / Assessment and Plan / ED Course  I have reviewed the triage vital signs and the nursing notes.  Pertinent labs & imaging results that were available during my care of the patient were reviewed by me and considered in my medical decision making (see chart for details).  Clinical Course as of Mar 06 2145  Wed Mar 06, 2017  1425 Compared with labs that the patient brings from the office her liver function testing today is essentially normal with a normal bilirubin, normal AST (prior AST measured at 1500)  [BM]    Clinical Course User Index [BM] MNoemi Chapel MD    The patient is not jaundiced, she has minimal tenderness, her CT scan from 2 days ago showed some biliary dilatation but it was not focal, she has had a prior cholecystectomy, her labs here have normalized,  she was sent for an MRCP which has been ordered.  Anticipate discharge unless there is something abnormal on the MRCP that needs being addressed.  She also complains of some lower back pain, her urinalysis is negative, she will be given a dose of pain medicine as she is on chronic medicines and has not had them yet today  Labs and MRCP discussed with pateint - no acute findings and labs were normal here as well without significan elevation in the LFT's - she is agreeable to d/c.  She is requesting imitrex for her  headaches since she doesn't want to take tylenol for them given live rhx. Rx given.  Final Clinical Impressions(s) / ED Diagnoses   Final diagnoses:  Upper abdominal pain    ED Discharge Orders        Ordered    SUMAtriptan (IMITREX) 50 MG tablet  Every 2 hours PRN     03/06/17 1628       Noemi Chapel, MD 03/06/17 2148

## 2017-03-06 NOTE — Discharge Instructions (Signed)
Your tests today showed no abnormal findings of concern Your MRI did not show any acute findings Continue your medicines as prescribed by your doctor including the Prilosec for your stomach See your doctor this week for recheck ER for worsening symptoms.

## 2017-03-06 NOTE — ED Notes (Signed)
Dr. Sabra Heck assessing patient in triage at this time.

## 2017-03-07 DIAGNOSIS — E063 Autoimmune thyroiditis: Secondary | ICD-10-CM | POA: Diagnosis not present

## 2017-03-07 DIAGNOSIS — K760 Fatty (change of) liver, not elsewhere classified: Secondary | ICD-10-CM | POA: Diagnosis not present

## 2017-03-07 DIAGNOSIS — E6609 Other obesity due to excess calories: Secondary | ICD-10-CM | POA: Diagnosis not present

## 2017-03-07 DIAGNOSIS — M797 Fibromyalgia: Secondary | ICD-10-CM | POA: Diagnosis not present

## 2017-03-07 DIAGNOSIS — E559 Vitamin D deficiency, unspecified: Secondary | ICD-10-CM | POA: Diagnosis not present

## 2017-03-07 DIAGNOSIS — J019 Acute sinusitis, unspecified: Secondary | ICD-10-CM | POA: Diagnosis not present

## 2017-03-07 DIAGNOSIS — I6350 Cerebral infarction due to unspecified occlusion or stenosis of unspecified cerebral artery: Secondary | ICD-10-CM | POA: Diagnosis not present

## 2017-03-07 DIAGNOSIS — Z7901 Long term (current) use of anticoagulants: Secondary | ICD-10-CM | POA: Diagnosis not present

## 2017-03-07 DIAGNOSIS — M1991 Primary osteoarthritis, unspecified site: Secondary | ICD-10-CM | POA: Diagnosis not present

## 2017-03-07 DIAGNOSIS — Z6834 Body mass index (BMI) 34.0-34.9, adult: Secondary | ICD-10-CM | POA: Diagnosis not present

## 2017-03-07 DIAGNOSIS — E782 Mixed hyperlipidemia: Secondary | ICD-10-CM | POA: Diagnosis not present

## 2017-03-07 DIAGNOSIS — I10 Essential (primary) hypertension: Secondary | ICD-10-CM | POA: Diagnosis not present

## 2017-03-08 ENCOUNTER — Ambulatory Visit (INDEPENDENT_AMBULATORY_CARE_PROVIDER_SITE_OTHER): Payer: PPO | Admitting: Physical Medicine and Rehabilitation

## 2017-03-08 ENCOUNTER — Ambulatory Visit (INDEPENDENT_AMBULATORY_CARE_PROVIDER_SITE_OTHER): Payer: PPO

## 2017-03-08 ENCOUNTER — Encounter (INDEPENDENT_AMBULATORY_CARE_PROVIDER_SITE_OTHER): Payer: Self-pay | Admitting: Physical Medicine and Rehabilitation

## 2017-03-08 VITALS — BP 101/62 | HR 52 | Temp 97.9°F

## 2017-03-08 DIAGNOSIS — G894 Chronic pain syndrome: Secondary | ICD-10-CM

## 2017-03-08 DIAGNOSIS — G629 Polyneuropathy, unspecified: Secondary | ICD-10-CM

## 2017-03-08 DIAGNOSIS — M5416 Radiculopathy, lumbar region: Secondary | ICD-10-CM

## 2017-03-08 DIAGNOSIS — M4316 Spondylolisthesis, lumbar region: Secondary | ICD-10-CM

## 2017-03-08 DIAGNOSIS — M5136 Other intervertebral disc degeneration, lumbar region: Secondary | ICD-10-CM

## 2017-03-08 DIAGNOSIS — M51369 Other intervertebral disc degeneration, lumbar region without mention of lumbar back pain or lower extremity pain: Secondary | ICD-10-CM

## 2017-03-08 MED ORDER — LIDOCAINE HCL (PF) 1 % IJ SOLN: 10.0000 mL | Freq: Once | INTRAMUSCULAR | Status: DC

## 2017-03-08 NOTE — Progress Notes (Deleted)
Lower back pain is increasing in intensity. Reports that pain is more towards the middle and is radiating more into legs than before. Started taking Cipro yesterday for suspected sinus infection. Did not take Valium.

## 2017-03-11 ENCOUNTER — Encounter (INDEPENDENT_AMBULATORY_CARE_PROVIDER_SITE_OTHER): Payer: Self-pay | Admitting: Physical Medicine and Rehabilitation

## 2017-03-11 ENCOUNTER — Other Ambulatory Visit (INDEPENDENT_AMBULATORY_CARE_PROVIDER_SITE_OTHER): Payer: Self-pay | Admitting: Physical Medicine and Rehabilitation

## 2017-03-11 MED ORDER — HYDROXYZINE HCL 10 MG PO TABS
10.0000 mg | ORAL_TABLET | Freq: Three times a day (TID) | ORAL | 0 refills | Status: DC | PRN
Start: 2017-03-11 — End: 2017-05-02

## 2017-03-11 NOTE — Procedures (Signed)
Spinal Cord Stimulator Trial  Patient: Debbie Pineda      Date of Birth: Jul 08, 1960 MRN: [id number] PCP: Jake Samples, PA-C      Visit Date: 03/08/2017   Universal Protocol:    Date/Time: 02/18/195:10 AM  Consent Given By: the patient  Position: prone  Additional Comments: Vital signs were monitored before and after the procedure. Patient was prepped and draped in the usual sterile fashion. The correct patient, procedure, and site was verified.   Injection Procedure Details:  Procedure Site One Meds Administered:  Meds ordered this encounter  Medications  . lidocaine (PF) (XYLOCAINE) 1 % injection 10 mL     Laterality: Bilateral  Location/Site: Leads were placed with the very proximal lead at the top of the T7 body. T10-11 T11-12  Needle size: 14 gauge   Needle type: EPIMED RX Coud Epidural Needle  Needle Placement: Epidural  Findings:  -  -Comments: Excellent stimulation achieved with paresthesias in all of the areas that she normally has pain including mid low back as well as bilateral legs.  Procedure Details: After localization of the T11-12 epidural space and the overlying soft tissue, the needle was introduced two bodies below this level to produce an adequate angle for epidural penetration. The soft tissue was infiltrated with 2-3 mls. of 1% Lidocaine without Epinephrine. Using the posterolateral approach, the #14 gauge Epimed needle was inserted down to the posterior aspect of the T12 lamina and then "walked off" the superior aspect of this lamina into the T12-L1 epidural space. The epidural space was localized with loss of resistance and negative aspirate for CSF or blood. The [employer] (16) electrode stimulation lead was passed up so that just the superior most lead was at the location noted above while maintaining the lead in the midline.   The above procedure was repeated for a second Ruffin (16)  electrode stimulation lead for the opposite side and T10-11 interspace. The pulse generator system was adjusted and programmed by myself and the company technical representative by varying the stimulator sites, rates, pulse amplitude, and duration. Adequate coverage was obtained as described above.  Subsequent to this, the introducer needle was removed and the spinal cord stimulator trial lead was fastened to the skin using steri-strips and strain reduction loop. The lead wire was then connected and Tegaderm was applied to produce an occlusive dressing. The patient was then brought out of the procedure room and was given a portable stimulator.  The patient will follow-up in three to seven days to determine whether this was efficacious.     Additional Comments:  The patient tolerated the procedure well No complications occurred Dressing:     Post-procedure details: Patient was observed during the procedure. Post-procedure instructions were reviewed.  Patient left the clinic in stable condition.

## 2017-03-11 NOTE — Progress Notes (Signed)
Debbie Pineda - 57 y.o. female MRN [id number]  Date of birth: 30-Jun-1960  Office Visit Note: Visit Date: 03/08/2017 PCP: Jake Samples, PA-C Referred by: Jake Samples, PA*  Subjective: Chief Complaint  Patient presents with  . Lower Back - Pain   HPI: Debbie Pineda is a pleasant 57 year old female that comes in today with her husband who provides some of the history today.  She is a patient followed by Dr. Louanne Pineda and whom I have seen on various occasions for facet joint injections and treatment of her axial low back pain.  She comes in today with significant worsening back pain with increasing intensity.  She reports its more pain in the middle of the back and then radiating into the more of the legs than before.  She has had some component of radicular pain in the past.  She has MRI findings of mainly degenerative changes of the facet and disc.  She has some mild foraminal narrowing.  No significant canal stenosis.  She is failed conservative care including physical therapy and medication management.  She is on opioid management as well as adjunctive treatment.  She is medically fairly complicated.  She does carry a diagnosis of fibromyalgia.  Today we are going to complete a spinal cord stimulator trial.  The patient is fairly somnolent today.  She did take her pain medications.  We did prescribe a Valium preprocedure but she did not take that.    ROS Otherwise per HPI.  Assessment & Plan: Visit Diagnoses:  1. Lumbar radiculopathy   2. Spondylolisthesis of lumbar region   3. Degenerative disc disease, lumbar   4. Chronic pain syndrome   5. Neuropathy     Plan: Findings:  As per the procedure note we did place 2 leads the top of T7 spanning 2 vertebral bodies.  There were no difficulties during the placement.    Meds & Orders:  Meds ordered this encounter  Medications  . lidocaine (PF) (XYLOCAINE) 1 % injection 10 mL    Orders Placed This Encounter  Procedures    . Spinal Cord Stimulator - Placement  . Spinal Cord Stimulator - Analysis  . XR C-ARM NO REPORT    Follow-up: Return in about 7 days (around 03/15/2017) for Recheck spine.   Procedures: No procedures performed  Spinal Cord Stimulator Trial  Patient: Debbie Pineda      Date of Birth: 05/27/1960 MRN: [id number] PCP: Jake Samples, PA-C      Visit Date: 03/08/2017   Universal Protocol:    Date/Time: 02/18/195:10 AM  Consent Given By: the patient  Position: prone  Additional Comments: Vital signs were monitored before and after the procedure. Patient was prepped and draped in the usual sterile fashion. The correct patient, procedure, and site was verified.   Injection Procedure Details:  Procedure Site One Meds Administered:  Meds ordered this encounter  Medications  . lidocaine (PF) (XYLOCAINE) 1 % injection 10 mL     Laterality: Bilateral  Location/Site: Leads were placed with the very proximal lead at the top of the T7 body. T10-11 T11-12  Needle size: 14 gauge   Needle type: EPIMED RX Coud Epidural Needle  Needle Placement: Epidural  Findings:  -  -Comments: Excellent stimulation achieved with paresthesias in all of the areas that she normally has pain including mid low back as well as bilateral legs.  Procedure Details: After localization of the T11-12 epidural space and the overlying soft tissue, the needle was  introduced two bodies below this level to produce an adequate angle for epidural penetration. The soft tissue was infiltrated with 2-3 mls. of 1% Lidocaine without Epinephrine. Using the posterolateral approach, the #14 gauge Epimed needle was inserted down to the posterior aspect of the T12 lamina and then "walked off" the superior aspect of this lamina into the T12-L1 epidural space. The epidural space was localized with loss of resistance and negative aspirate for CSF or blood. The [employer] (16) electrode stimulation lead  was passed up so that just the superior most lead was at the location noted above while maintaining the lead in the midline.   The above procedure was repeated for a second [place] (16) electrode stimulation lead for the opposite side and T10-11 interspace. The pulse generator system was adjusted and programmed by myself and the company technical representative by varying the stimulator sites, rates, pulse amplitude, and duration. Adequate coverage was obtained as described above.  Subsequent to this, the introducer needle was removed and the spinal cord stimulator trial lead was fastened to the skin using steri-strips and strain reduction loop. The lead wire was then connected and Tegaderm was applied to produce an occlusive dressing. The patient was then brought out of the procedure room and was given a portable stimulator.  The patient will follow-up in three to seven days to determine whether this was efficacious.     Additional Comments:  The patient tolerated the procedure well No complications occurred Dressing:     Post-procedure details: Patient was observed during the procedure. Post-procedure instructions were reviewed.  Patient left the clinic in stable condition.     Clinical History: Lumbar CT myelogram 04/06/2016  IMPRESSION: 1. No acute or focal abnormality to explain the patient's thoracolumbar pain 2. Left paramedian calcified disc protrusion at T12-L1 without significant stenosis. 3. Mild osseous foraminal narrowing bilaterally at T10-11. 4. Mild bilateral foraminal narrowing at L1-2. 5. Mild left subarticular narrowing at L2-3 with moderate left and mild right foraminal stenosis. 6. Mild right subarticular narrowing at L3-4 with mild foraminal narrowing bilaterally, right greater than left. 7. Mild foraminal narrowing at L4-5 with moderate bilateral facet hypertrophy but no significant central canal stenosis. 8. Moderate facet  hypertrophy and spurring is worse on the left with mild left subarticular narrowing potentially impacting the left S1 nerve root.  Lumbar spine MRI 01/09/2016  L2-3: Mild retrolisthesis. Mild disc and facet degeneration. Mild foraminal narrowing bilaterally.  L3-4: Mild retrolisthesis. Mild disc and facet degeneration. Mild right foraminal narrowing.  L4-5:  Mild disc and facet degeneration without significant stenosis  L5-S1: Moderate to advanced disc degeneration with disc space narrowing. Mild endplate spurring without significant stenosis.  IMPRESSION: Lumbar disc and facet degeneration is stable since 07/20/2014. Negative for disc protrusion or significant spinal stenosis. Mild right foraminal narrowing at L3-4.  She reports that she quit smoking about 26 years ago. Her smoking use included cigarettes. She has a 6.00 pack-year smoking history. she has never used smokeless tobacco. No results for input(s): HGBA1C, LABURIC in the last 8760 hours.  Objective:  VS:  HT:    WT:   BMI:     BP:101/62  HR:(!) 52bpm  TEMP:97.9 F (36.6 C)(Oral)  RESP:98 % Physical Exam  Musculoskeletal:  Patient is very somnolent.  She has good distal strength and does ambulate without aid.    Ortho Exam Imaging: No results found.  Past Medical/Family/Surgical/Social History: Medications & Allergies reviewed per EMR Patient Active Problem List  Diagnosis Date Noted  . Presence of right artificial knee joint 09/17/2016  . H/O total knee replacement, right 08/15/2016  . Presence of retained hardware   . Memory disorder 08/14/2016  . Cerebrovascular disease 08/14/2016  . Unilateral primary osteoarthritis, right knee 05/29/2016  . Primary osteoarthritis of both feet 05/29/2016  . Trochanteric bursitis of both hips 05/25/2016  . Neck pain 05/25/2016  . Urinary urgency 04/19/2016  . Chronic low back pain 01/11/2016  . Depression 11/29/2015  . Total knee replacement status 03/23/2015    . Heart palpitations 12/14/2013  . Generalized osteoarthritis of multiple sites 11/04/2013  . Cerebral infarction ([place]) 10/30/2011  . PFO (patent foramen ovale) 10/30/2011  . Bradycardia 10/30/2011  . Chest pain 10/30/2011  . Hypothyroid   . Thrombophlebitis   . Sleep apnea   . Fibromyalgia   . 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 Status post bariatric surgery 12/07/2010  . Vein disorder 11/29/2010  . S/P total hysterectomy and bilateral salpingo-oophorectomy 11/29/2010  . S/P exploratory laparotomy 11/29/2010  . S/P cholecystectomy 11/29/2010  . S/P ACL surgery 11/29/2010  . Morbid obesity ([place]) 11/10/2010   Past Medical History:  Diagnosis Date  . ADD (attention deficit disorder)    takes Adderall daily  . Arthritis    "all over"  . Cerebral infarction ([place]) 10/30/2011  . Cerebrovascular disease 08/14/2016  . Chronic back pain    "all over"  . Chronic low back pain 01/11/2016  . Complication of anesthesia    tends to have hypotension when NPO and post-anesthesia  . Constipation    takes stool softener daily  . Degenerative disk disease   . Degenerative joint disease   . Depression    takes Cymbalta daily for pain per pt  . DVT (deep venous thrombosis) ([facility])    RLE  . Family history of adverse reaction to anesthesia    a family member woke up during surgery; "think it was my mom"  . Fibromyalgia   . Generalized osteoarthritis of multiple sites 11/04/2013  . GERD (gastroesophageal reflux disease)   . Heart palpitations 12/14/2013  . History of blood clots    superficial  . Hypoglycemia   . Hypothyroid    takes Synthroid daily  . Incomplete emptying of bladder   . Insomnia    takes Trazodone nightly  . Iron deficiency anemia    takes Ferrous Sulfate daily  . Joint pain   . Joint swelling    knees and ankles  . Memory disorder 08/14/2016  . Morbid obesity ([place])   . Nausea    takes Zofran as needed.Seeing GI doc  . Neck pain 05/25/2016  . OSA  on CPAP    tested more than 5 yrs ago.    . Osteoarthritis   . PFO (patent foramen ovale)   . Primary osteoarthritis of both feet 05/29/2016   Right bunionectomy August 2017 by Dr. Sharol Given  . Scoliosis   . Sleep apnea   . Stroke [facility]) "several"   right foot weakness; memory issues, black spot right visual field since" (03/23/2015)  . Thrombophlebitis   . Trochanteric bursitis of both hips 05/25/2016  . Unilateral primary osteoarthritis, right knee 05/29/2016  . Urinary urgency 04/19/2016  . Vein disorder 11/29/2010   Family History  Problem Relation Age of Onset  . Heart disease Father   . Cancer Father   . Cancer Mother        skin cancer   . Heart disease Brother   .  Cancer Brother   . Diabetes Brother   . Stroke Brother   . Heart disease Sister   . Cancer Maternal Grandfather   . Hypothyroidism Daughter   . Colon cancer Neg Hx    Past Surgical History:  Procedure Laterality Date  . BUNIONECTOMY Right 08/2015  . CARDIAC CATHETERIZATION     2008.  "it was fine" (not sure why she had it done, and doesn't know where)  . COLONOSCOPY N/A 03/25/2013   Procedure: COLONOSCOPY;  Surgeon: Rogene Houston, MD;  Location[facility];  Service: Endoscopy;  Laterality: N/A;  930  . ESOPHAGOGASTRODUODENOSCOPY    . EXPLORATORY LAPAROTOMY     "took fallopian tubes out"  . JOINT REPLACEMENT     bil knee   . KNEE ARTHROPLASTY    . KNEE ARTHROSCOPY Left   . KNEE ARTHROSCOPY W/ ACL RECONSTRUCTION Right    "added pins"  . LAPAROSCOPIC CHOLECYSTECTOMY  ~ 2001  . 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 TOTAL KNEE ARTHROPLASTY Left 03/23/2015   Procedure: TOTAL KNEE ARTHROPLASTY;  Surgeon: Newt Minion, MD;  Location: [place];  Service: Orthopedics;  Laterality: Left;  . TOTAL KNEE ARTHROPLASTY Right 08/15/2016   Procedure: RIGHT TOTAL KNEE ARTHROPLASTY, REMOVAL ACL SCREWS;  Surgeon: Newt Minion, MD;  Location: [place];  Service: Orthopedics;  Laterality: Right;  . TOTAL KNEE ARTHROPLASTY WITH  HARDWARE REMOVAL Right   . VAGINAL HYSTERECTOMY    . VARICOSE VEIN SURGERY Right X 2   Social History   Occupational History  . Occupation: Disability  . Occupation: formerly Therapist, sports, [employer]  Tobacco Use  . Smoking status: Former Smoker    Packs/day: 0.75    Years: 8.00    Pack years: 6.00    Types: Cigarettes    Last attempt to quit: 12/01/1990    Years since quitting: 26.2  . Smokeless tobacco: Never Used  . Tobacco comment: quit smoking in the 1990s  Substance and Sexual Activity  . Alcohol use: No    Comment: 03/23/2015 "stopped drinking in 2012 w/gastric bypass; drank socially before bypass"  . Drug use: No  . Sexual activity: Not Currently    Birth control/protection: Surgical

## 2017-03-11 NOTE — Progress Notes (Signed)
Debbie Pineda - 57 y.o. female MRN [id number]  Date of birth: [birth year]  Office Visit Note: Visit Date: 03/11/2017 PCP: Jake Samples, PA-C Referred by: No ref. provider found  Subjective: No chief complaint on file.  HPI: Debbie Pineda is a 57 year old female who comes in today at my request.  He had a spinal cord stimulator trial placed there in the morning of Friday last week.  Secondary to miscommunication on my part the patient taking Xarelto times Thursday prior to the procedure.  She reported some postprocedure pain right at the injection site but was reporting decreased symptoms of her normal pain with adequate stimulation from the spinal cord stimulator.  She also noted some drainage and seepage into the dressing.  Over the weekend she was in close contact with myself and the [employer], [place].  She did send [place] pictures of the dressing which showed a fairly normal amount of seepage into the gauze pad.  She reports that initial post procedure right around the injection site.  She did not have any red flag symptoms such as weakness or problems.  Continued to have less pain over the weekend and today seeing her she reports pain is the spinal cord stimulator more so with it than her pain medicine regimen.  She still reports some discomfort right above the injection site about 2 inches.  She only reports discomfort when she is sitting back on a hard chair.  Again, she reports low back pain is substantially better particularly with the stimulator in place.  She has had some mild issues just with the stimulator itself with a communication error, change in stimulation with going from sit to stand decrease stimulation if she lies flat on her back for a while.    Review of Systems  Constitutional: Negative for chills and fever.  Musculoskeletal: Positive for back pain and joint pain.  Neurological: Positive for tingling. Negative for focal weakness and weakness.   Otherwise per HPI.  Assessment & Plan: Visit Diagnoses: No diagnosis found.  Plan: Findings:  Patient reports that she would definitely like to pursue permanent placement of spinal cord stimulator.  She reports getting much more than 50% relief during the stimulation.  She has shown significant pain relief of the area around the injection site which was painful post injection.  The issue with her being on the Xarelto does not seem to be an issue at this point.  I was going to remove the leads today as she is getting some itching because of the paper tape allergy and adhesive allergy.  She did by her own admission take 1 Xarelto yesterday out of habit.  I think the issue with blood thinners is an issue because she does take a lot of medication.  At this point I think the safest approach is to have her come back on Wednesday where she has had 2 full days without Xarelto perform the lead pull at that point.  I will try to have Tillie Rung from [employer] present.  I will keep her off the Xarelto after the lead pull for at least one full day.  She will be able to resume at that point.  Going forward if there is a permanent spinal cord stimulator placement Dr. Maryjean Ka needs to have full dialogue with anticoagulation restrictions.  I will prescribe hydroxyzine for itching.  She has tried Benadryl without much relief.  The patient was present with her mother today who is a retired [employer] manager.  Her mother had questions about the permanent placement being done at the surgery center versus hospital setting.  I tried to answer those questions as best I could but I know going forward they can asked those of Dr. Maryjean Ka as well.  Patient was directed to notify me if there is any changes in symptoms between now and Wednesday.    Meds & Orders: No orders of the defined types were placed in this encounter.  No orders of the defined types were placed in this encounter.   Follow-up: No Follow-up on file.    Procedures: No procedures performed  No notes on file   Clinical History: Lumbar CT myelogram 04/06/2016  IMPRESSION: 1. No acute or focal abnormality to explain the patient's thoracolumbar pain 2. Left paramedian calcified disc protrusion at T12-L1 without significant stenosis. 3. Mild osseous foraminal narrowing bilaterally at T10-11. 4. Mild bilateral foraminal narrowing at L1-2. 5. Mild left subarticular narrowing at L2-3 with moderate left and mild right foraminal stenosis. 6. Mild right subarticular narrowing at L3-4 with mild foraminal narrowing bilaterally, right greater than left. 7. Mild foraminal narrowing at L4-5 with moderate bilateral facet hypertrophy but no significant central canal stenosis. 8. Moderate facet hypertrophy and spurring is worse on the left with mild left subarticular narrowing potentially impacting the left S1 nerve root.  Lumbar spine MRI 01/09/2016  L2-3: Mild retrolisthesis. Mild disc and facet degeneration. Mild foraminal narrowing bilaterally.  L3-4: Mild retrolisthesis. Mild disc and facet degeneration. Mild right foraminal narrowing.  L4-5:  Mild disc and facet degeneration without significant stenosis  L5-S1: Moderate to advanced disc degeneration with disc space narrowing. Mild endplate spurring without significant stenosis.  IMPRESSION: Lumbar disc and facet degeneration is stable since 07/20/2014. Negative for disc protrusion or significant spinal stenosis. Mild right foraminal narrowing at L3-4.  She reports that she quit smoking about 26 years ago. Her smoking use included cigarettes. She has a 6.00 pack-year smoking history. she has never used smokeless tobacco. No results for input(s): HGBA1C, LABURIC in the last 8760 hours.  Objective:  VS:  HT:    WT:   BMI:     BP:   HR: bpm  TEMP: ( )  RESP:  Physical Exam  Constitutional: She is oriented to person, place, and time. She appears well-developed and  well-nourished.  Eyes: Conjunctivae and EOM are normal. Pupils are equal, round, and reactive to light.  Cardiovascular: Normal rate and intact distal pulses.  Pulmonary/Chest: Effort normal.  Musculoskeletal:  Examination of the lumbar spine shows intact dig with Tegaderm fairly seepage into the gauze pad.  There is some fraying of the ends of the Steri-Strips but no frank rash.  There is no swelling.  There is no redness.  There is no induration.  She is tender to palpation along the paraspinal musculature just above the gauze pad.  She has good strength proximally distally in both lower extremities.  No clonus  Neurological: She is alert and oriented to person, place, and time. She exhibits normal muscle tone. Coordination normal.  Skin: Skin is warm and dry. No rash noted. No erythema.  Psychiatric: She has a normal mood and affect. Her behavior is normal.  Nursing note and vitals reviewed.   Ortho Exam Imaging: No results found.  Past Medical/Family/Surgical/Social History: Medications & Allergies reviewed per EMR Patient Active Problem List   Diagnosis Date Noted  . Presence of right artificial knee joint 09/17/2016  . H/O total knee replacement, right 08/15/2016  . Presence  of retained hardware   . Memory disorder 08/14/2016  . Cerebrovascular disease 08/14/2016  . Unilateral primary osteoarthritis, right knee 05/29/2016  . Primary osteoarthritis of both feet 05/29/2016  . Trochanteric bursitis of both hips 05/25/2016  . Neck pain 05/25/2016  . Urinary urgency 04/19/2016  . Chronic low back pain 01/11/2016  . Depression 11/29/2015  . 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 Heart palpitations 12/14/2013  . Generalized osteoarthritis of multiple sites 11/04/2013  . Cerebral infarction ([place]) 10/30/2011  . PFO (patent foramen ovale) 10/30/2011  . Bradycardia 10/30/2011  . Chest pain 10/30/2011  . Hypothyroid   . Thrombophlebitis   . Sleep apnea   . Fibromyalgia   .  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 Status post bariatric surgery 12/07/2010  . Vein disorder 11/29/2010  . S/P total hysterectomy and bilateral salpingo-oophorectomy 11/29/2010  . S/P exploratory laparotomy 11/29/2010  . S/P cholecystectomy 11/29/2010  . S/P ACL surgery 11/29/2010  . Morbid obesity ([place]) 11/10/2010   Past Medical History:  Diagnosis Date  . ADD (attention deficit disorder)    takes Adderall daily  . Arthritis    "all over"  . Cerebral infarction ([place]) 10/30/2011  . Cerebrovascular disease 08/14/2016  . Chronic back pain    "all over"  . Chronic low back pain 01/11/2016  . Complication of anesthesia    tends to have hypotension when NPO and post-anesthesia  . Constipation    takes stool softener daily  . Degenerative disk disease   . Degenerative joint disease   . Depression    takes Cymbalta daily for pain per pt  . DVT (deep venous thrombosis) (HCC)    RLE  . Family history of adverse reaction to anesthesia    a family member woke up during surgery; "think it was my mom"  . Fibromyalgia   . Generalized osteoarthritis of multiple sites 11/04/2013  . GERD (gastroesophageal reflux disease)   . Heart palpitations 12/14/2013  . History of blood clots    superficial  . Hypoglycemia   . Hypothyroid    takes Synthroid daily  . Incomplete emptying of bladder   . Insomnia    takes Trazodone nightly  . Iron deficiency anemia    takes Ferrous Sulfate daily  . Joint pain   . Joint swelling    knees and ankles  . Memory disorder 08/14/2016  . Morbid obesity ([place])   . Nausea    takes Zofran as needed.Seeing GI doc  . Neck pain 05/25/2016  . OSA on CPAP    tested more than 5 yrs ago.    . Osteoarthritis   . PFO (patent foramen ovale)   . Primary osteoarthritis of both feet 05/29/2016   Right bunionectomy August 2017 by Dr. Sharol Given  . Scoliosis   . Sleep apnea   . Stroke [facility]) "several"   right foot weakness; memory issues, black spot right  visual field since" (03/23/2015)  . Thrombophlebitis   . Trochanteric bursitis of both hips 05/25/2016  . Unilateral primary osteoarthritis, right knee 05/29/2016  . Urinary urgency 04/19/2016  . Vein disorder 11/29/2010   Family History  Problem Relation Age of Onset  . Heart disease Father   . Cancer Father   . Cancer Mother        skin cancer   . Heart disease Brother   . Cancer Brother   . Diabetes Brother   . Stroke Brother   . Heart disease Sister   . Cancer Maternal  Grandfather   . Hypothyroidism Daughter   . Colon cancer Neg Hx    Past Surgical History:  Procedure Laterality Date  . BUNIONECTOMY Right 08/2015  . CARDIAC CATHETERIZATION     2008.  "it was fine" (not sure why she had it done, and doesn't know where)  . COLONOSCOPY N/A 03/25/2013   Procedure: COLONOSCOPY;  Surgeon: Rogene Houston, MD;  [facility];  Service: Endoscopy;  Laterality: N/A;  930  . ESOPHAGOGASTRODUODENOSCOPY    . EXPLORATORY LAPAROTOMY     "took fallopian tubes out"  . JOINT REPLACEMENT     bil knee   . KNEE ARTHROPLASTY    . KNEE ARTHROSCOPY Left   . KNEE ARTHROSCOPY W/ ACL RECONSTRUCTION Right    "added pins"  . LAPAROSCOPIC CHOLECYSTECTOMY  ~ 2001  . 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 TOTAL KNEE ARTHROPLASTY Left 03/23/2015   Procedure: TOTAL KNEE ARTHROPLASTY;  Surgeon: Newt Minion, MD;  Location: [place];  Service: Orthopedics;  Laterality: Left;  . TOTAL KNEE ARTHROPLASTY Right 08/15/2016   Procedure: RIGHT TOTAL KNEE ARTHROPLASTY, REMOVAL ACL SCREWS;  Surgeon: Newt Minion, MD;  Location: [place];  Service: Orthopedics;  Laterality: Right;  . TOTAL KNEE ARTHROPLASTY WITH HARDWARE REMOVAL Right   . VAGINAL HYSTERECTOMY    . VARICOSE VEIN SURGERY Right X 2   Social History   Occupational History  . Occupation: Disability  . Occupation: formerly Therapist, sports, [employer]  Tobacco Use  . Smoking status: Former Smoker    Packs/day: 0.75    Years: 8.00    Pack years: 6.00    Types:  Cigarettes    Last attempt to quit: 12/01/1990    Years since quitting: 26.2  . Smokeless tobacco: Never Used  . Tobacco comment: quit smoking in the 1990s  Substance and Sexual Activity  . Alcohol use: No    Comment: 03/23/2015 "stopped drinking in 2012 w/gastric bypass; drank socially before bypass"  . Drug use: No  . Sexual activity: Not Currently    Birth control/protection: Surgical

## 2017-03-12 ENCOUNTER — Encounter (INDEPENDENT_AMBULATORY_CARE_PROVIDER_SITE_OTHER): Payer: Self-pay | Admitting: Internal Medicine

## 2017-03-12 ENCOUNTER — Ambulatory Visit (INDEPENDENT_AMBULATORY_CARE_PROVIDER_SITE_OTHER): Payer: PPO | Admitting: Internal Medicine

## 2017-03-12 VITALS — BP 140/82 | HR 92 | Temp 98.3°F | Ht 65.0 in | Wt 213.3 lb

## 2017-03-12 DIAGNOSIS — R748 Abnormal levels of other serum enzymes: Secondary | ICD-10-CM | POA: Diagnosis not present

## 2017-03-12 LAB — CBC WITH DIFFERENTIAL/PLATELET
Basophils Absolute: 54 cells/uL (ref 0–200)
Basophils Relative: 0.8 %
Eosinophils Absolute: 141 cells/uL (ref 15–500)
Eosinophils Relative: 2.1 %
HCT: 38 % (ref 35.0–45.0)
Hemoglobin: 12.7 g/dL (ref 11.7–15.5)
Lymphs Abs: 2412 cells/uL (ref 850–3900)
MCH: 33.1 pg — ABNORMAL HIGH (ref 27.0–33.0)
MCHC: 33.4 g/dL (ref 32.0–36.0)
MCV: 99 fL (ref 80.0–100.0)
MPV: 10.4 fL (ref 7.5–12.5)
Monocytes Relative: 6 %
Neutro Abs: 3692 cells/uL (ref 1500–7800)
Neutrophils Relative %: 55.1 %
Platelets: 241 10*3/uL (ref 140–400)
RBC: 3.84 10*6/uL (ref 3.80–5.10)
RDW: 11.6 % (ref 11.0–15.0)
Total Lymphocyte: 36 %
WBC mixed population: 402 cells/uL (ref 200–950)
WBC: 6.7 10*3/uL (ref 3.8–10.8)

## 2017-03-12 LAB — COMPREHENSIVE METABOLIC PANEL
AG Ratio: 1.7 (calc) (ref 1.0–2.5)
ALT: 52 U/L — ABNORMAL HIGH (ref 6–29)
AST: 24 U/L (ref 10–35)
Albumin: 4.1 g/dL (ref 3.6–5.1)
Alkaline phosphatase (APISO): 112 U/L (ref 33–130)
BUN: 13 mg/dL (ref 7–25)
CO2: 26 mmol/L (ref 20–32)
Calcium: 9.5 mg/dL (ref 8.6–10.4)
Chloride: 104 mmol/L (ref 98–110)
Creat: 1.03 mg/dL (ref 0.50–1.05)
Globulin: 2.4 g/dL (calc) (ref 1.9–3.7)
Glucose, Bld: 77 mg/dL (ref 65–139)
Potassium: 4.4 mmol/L (ref 3.5–5.3)
Sodium: 138 mmol/L (ref 135–146)
Total Bilirubin: 0.4 mg/dL (ref 0.2–1.2)
Total Protein: 6.5 g/dL (ref 6.1–8.1)

## 2017-03-12 NOTE — Progress Notes (Signed)
Subjective:    Patient ID: Debbie Pineda, female    DOB: 10-12-1960, 57 y.o.   MRN: [id number]  HPI Here today for f/u. Referred to the ED by me 03/06/2016 for elevated liver enzymes and upper abdominal pain quadran.  Outpatient liver enzymes from [place] ALP 229, AST 1563.  Apparently had been taking tylenol 516m 4-5 tabs a day.   .Marland KitchenShe [facility]. She asked them to check her liver enzymes.  Liver enzymes noted to be greatly elevated. Tylenol is now on hold.  In the ED, her liver enzymes had trended down greatly. She was evaluated and released.    She underwent an MRCP which revealed IMPRESSION: 1. No acute findings.  No explanation for patient's elevated LFTs. 2. Fusiform dilatation of the common bile duct status post cholecystectomy. No choledocholithiasis or mass identified. CBD 151m  Repeat CMET in the ED revealed ALT 177, ALP 136, AST 34.  Xarelto on hold. Taking a Baby ASA daily at present.  Trial will end end of this week. She will resume her Xarelto.. Marland Kitchenx of thrombophelbitis   CMP Latest Ref Rng & Units 03/06/2017 08/06/2016 03/12/2016  Glucose 65 - 99 mg/dL 143(H) 90 -  BUN 6 - 20 mg/dL 13 9 -  Creatinine 0.44 - 1.00 mg/dL 0.84 1.01(H) -  Sodium 135 - 145 mmol/L 137 140 -  Potassium 3.5 - 5.1 mmol/L 3.6 5.1 -  Chloride 101 - 111 mmol/L 104 108 -  CO2 22 - 32 mmol/L 24 25 -  Calcium 8.9 - 10.3 mg/dL 9.2 9.1 -  Total Protein 6.5 - 8.1 g/dL 6.7 - 6.4  Total Bilirubin 0.3 - 1.2 mg/dL 0.4 - 0.4  Alkaline Phos 38 - 126 U/L 136(H) - 65  AST 15 - 41 U/L 34 - 16  ALT 14 - 54 U/L 177(H) - 22       Review of Systems Past Medical History:  Diagnosis Date  . ADD (attention deficit disorder)    takes Adderall daily  . Arthritis    "all over"  . [place]  . Cerebrovascular disease 08/14/2016  . Chronic back pain    "all over"  . Chronic low back pain 01/11/2016  . Complication of anesthesia    tends to have hypotension when NPO and post-anesthesia  . Constipation    takes stool softener daily  . Degenerative disk disease   . Degenerative joint disease   . Depression    takes Cymbalta daily for pain per pt  . DVT (deep venous thrombosis) (HCC)    RLE  . Family history of adverse reaction to anesthesia    a family member woke up during surgery; "think it was my mom"  . Fibromyalgia   . Generalized osteoarthritis of multiple sites 11/04/2013  . GERD (gastroesophageal reflux disease)   . Heart palpitations 12/14/2013  . History of blood clots    superficial  . Hypoglycemia   . Hypothyroid    takes Synthroid daily  . Incomplete emptying of bladder   . Insomnia    takes Trazodone nightly  . Iron deficiency anemia    takes Ferrous Sulfate daily  . Joint pain   . Joint swelling    knees and ankles  . Memory disorder 08/14/2016  . [place]  . Nausea    takes Zofran as needed.Seeing GI doc  . Neck pain 05/25/2016  . OSA on CPAP  tested more than 5 yrs ago.    . Osteoarthritis   . PFO (patent foramen ovale)   . Primary osteoarthritis of both feet 05/29/2016   Right bunionectomy August 2017 by Dr. Sharol Given  . Scoliosis   . Sleep apnea   . Stroke [facility]) "several"   right foot weakness; memory issues, black spot right visual field since" (03/23/2015)  . Thrombophlebitis   . Trochanteric bursitis of both hips 05/25/2016  . Unilateral primary osteoarthritis, right knee 05/29/2016  . Urinary urgency 04/19/2016  . Vein disorder 11/29/2010    Past Surgical History:  Procedure Laterality Date  . BUNIONECTOMY Right 08/2015  . CARDIAC CATHETERIZATION     2008.  "it was fine" (not sure why she had it done, and doesn't know where)  . COLONOSCOPY N/A 03/25/2013   Procedure: COLONOSCOPY;  Surgeon: Rogene Houston, MD;  [facility];  Service: Endoscopy;  Laterality: N/A;  930  . ESOPHAGOGASTRODUODENOSCOPY    . EXPLORATORY LAPAROTOMY     "took fallopian tubes out"  .  JOINT REPLACEMENT     bil knee   . KNEE ARTHROPLASTY    . KNEE ARTHROSCOPY Left   . KNEE ARTHROSCOPY W/ ACL RECONSTRUCTION Right    "added pins"  . LAPAROSCOPIC CHOLECYSTECTOMY  ~ 2001  . 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 TOTAL KNEE ARTHROPLASTY Left 03/23/2015   Procedure: TOTAL KNEE ARTHROPLASTY;  Surgeon: Newt Minion, MD;  Location: [place];  Service: Orthopedics;  Laterality: Left;  . TOTAL KNEE ARTHROPLASTY Right 08/15/2016   Procedure: RIGHT TOTAL KNEE ARTHROPLASTY, REMOVAL ACL SCREWS;  Surgeon: Newt Minion, MD;  Location: [place];  Service: Orthopedics;  Laterality: Right;  . TOTAL KNEE ARTHROPLASTY WITH HARDWARE REMOVAL Right   . VAGINAL HYSTERECTOMY    . VARICOSE VEIN SURGERY Right X 2    Allergies  Allergen Reactions  . Lyrica [Pregabalin] Other (See Comments)    SOB, lower extremity edema and weight gain  . Flexeril [Cyclobenzaprine Hcl] Other (See Comments)    Numbness of extremities  . Morphine And Related Itching    Upper torso  . Sulfamethoxazole-Trimethoprim Itching and Rash    Bactrim  . Belsomra [Suvorexant] Other (See Comments)    unknown  . Tape Itching and Rash    Please use "paper" tape Rash if left on longer than 24 hrs    Current Outpatient Medications on File Prior to Visit  Medication Sig Dispense Refill  . baclofen (LIORESAL) 10 MG tablet Take 10 mg by mouth 3 (three) times daily.     . ciprofloxacin (CIPRO) 500 MG tablet     . Cyanocobalamin (B-12) 2500 MCG SUBL Place 5,000 mcg under the tongue daily.     . diazepam (VALIUM) 10 MG tablet Take 1 tablet (10 mg total) by mouth once as needed for up to 1 dose for anxiety. Please 1 hour prior to procedure 2 tablet 0  . diclofenac sodium (VOLTAREN) 1 % GEL Apply 4 g topically 4 (four) times daily as needed (PAIN). Reported on 03/21/2015    . donepezil (ARICEPT) 5 MG tablet Take 1 tablet (5 mg total) by mouth at bedtime. 30 tablet 1  . DULoxetine (CYMBALTA) 60 MG capsule Take 60 mg by mouth 2 (two)  times daily.  11  . ferrous sulfate 325 (65 FE) MG tablet Take 325 mg by mouth daily at 12 noon.    . folic acid (FOLVITE) 539 MCG tablet Take 400 mcg by mouth daily.    Marland Kitchen  gabapentin (NEURONTIN) 600 MG tablet Take 600 mg by mouth 3 (three) times daily.    [place] Reek 6.5 MG INST Place 1 suppository vaginally at bedtime.     Javier Docker Oil 350 MG CAPS Take 350 mg by mouth at bedtime.    Marland Kitchen L-Methylfolate-B12-B6-B2 (CEREFOLIN) 06-22-48-5 MG TABS Take 1 tablet by mouth daily. 90 each 3  . levothyroxine (SYNTHROID, LEVOTHROID) 125 MCG tablet Take 125 mcg by mouth daily before breakfast.    . methocarbamol (ROBAXIN) 750 MG tablet TAKE 1 TABLET BY MOUTH THREE TIMES DAILY 90 tablet 2  . methylphenidate (RITALIN) 20 MG tablet Take 20 mg by mouth 3 (three) times daily. Takes at 0800, 1200, 1600    . Multiple Minerals-Vitamins (CAL-MAG-ZINC-D PO) Take 1-2 tablets by mouth See admin instructions. Take 2 tablet at 1200 and 1 tablet at 1600  Calcium 1000 mg Vit D 600 iu Magnesium 400 mg Zinc 15 mg    . Multiple Vitamin (MULTIVITAMIN WITH MINERALS) TABS tablet Take 1 tablet by mouth 2 (two) times daily. One a day petites    . NARCAN 4 MG/0.1ML LIQD nasal spray kit as needed.    . NON FORMULARY Nature's Bounty C 548m daialy    . omeprazole (PRILOSEC) 40 MG capsule Take 1 capsule by mouth twice daily. 60 capsule 5  . ondansetron (ZOFRAN-ODT) 4 MG disintegrating tablet Take 4 mg by mouth every 6 (six) hours as needed for nausea.   2  . oxybutynin (DITROPAN-XL) 10 MG 24 hr tablet Take 10 mg by mouth daily.    . Oxycodone HCl 10 MG TABS TAKE 1 TYABLET BY MOUTH EVERY 4-6 HOURS AS NEEDED FOR PAIN  0  . polyethylene glycol powder (MIRALAX) powder Take 17 g by mouth daily.     . Pseudoephedrine HCl (SUDAFED PO) Take 1-2 tablets by mouth daily as needed (for allergies-congestion).     . topiramate (TOPAMAX) 25 MG tablet Take 25 mg by mouth 2 (two) times daily.    . traMADol (ULTRAM) 50 MG tablet Take 50 mg by mouth 4  (four) times daily.     . traZODone (DESYREL) 100 MG tablet Take 100-300 mg by mouth at bedtime as needed for sleep (depends on insomnia if takes 1-3 tablets).     . Vitamin D, Ergocalciferol, (DRISDOL) 50000 UNITS CAPS Take 50,000 Units by mouth every 7 (seven) days. Mondays    . hydrOXYzine (ATARAX/VISTARIL) 10 MG tablet Take 1 tablet (10 mg total) by mouth 3 (three) times daily as needed. (Patient not taking: Reported on 03/12/2017) 30 tablet 0  . rivaroxaban (XARELTO) 20 MG TABS tablet Take 1 tablet (20 mg total) by mouth daily with supper. (Patient not taking: Reported on 03/12/2017) 30 tablet 1  . SUMAtriptan (IMITREX) 50 MG tablet Take 1 tablet (50 mg total) by mouth every 2 (two) hours as needed for migraine (ongoing headache). Maximum daily dose - 2 tablets (Patient not taking: Reported on 03/12/2017) 15 tablet 0  . UNABLE TO FIND Bilateral Knee high 20-30 mmHg compression stockings. 1 Package 0  . vitamin C (ASCORBIC ACID) 500 MG tablet Take 500 mg by mouth daily.     Current Facility-Administered Medications on File Prior to Visit  Medication Dose Route Frequency Provider Last Rate Last Dose  . lidocaine (PF) (XYLOCAINE) 1 % injection 10 mL  10 mL Other Once NMagnus Sinning MD            Objective:   Physical Exam Blood pressure 140/82, pulse 92,  temperature 98.3 F (36.8 C), height '5\' 5"'  (1.651 m), weight 213 lb 4.8 oz (96.8 kg).  Alert and oriented. Skin warm and dry. Oral mucosa is moist.   . Sclera anicteric, conjunctivae is pink. Thyroid not enlarged. No cervical lymphadenopathy. Lungs clear. Heart regular rate and rhythm.  Abdomen is soft. Bowel sounds are positive. No hepatomegaly. No abdominal masses felt. No tenderness.  No edema to lower extremities.          Assessment & Plan:  Elevated liver enzymes. Liver enzymes have come down significantly.  Will repeat CMET today. Continue to hold Tylenol.  OV in 6 months.

## 2017-03-12 NOTE — Patient Instructions (Addendum)
CMET today 

## 2017-03-13 ENCOUNTER — Telehealth (INDEPENDENT_AMBULATORY_CARE_PROVIDER_SITE_OTHER): Payer: Self-pay | Admitting: Specialist

## 2017-03-13 ENCOUNTER — Ambulatory Visit (INDEPENDENT_AMBULATORY_CARE_PROVIDER_SITE_OTHER): Payer: PPO | Admitting: Physical Medicine and Rehabilitation

## 2017-03-13 DIAGNOSIS — M5136 Other intervertebral disc degeneration, lumbar region: Secondary | ICD-10-CM

## 2017-03-13 DIAGNOSIS — G894 Chronic pain syndrome: Secondary | ICD-10-CM

## 2017-03-13 DIAGNOSIS — G629 Polyneuropathy, unspecified: Secondary | ICD-10-CM

## 2017-03-13 DIAGNOSIS — F909 Attention-deficit hyperactivity disorder, unspecified type: Secondary | ICD-10-CM | POA: Diagnosis not present

## 2017-03-13 DIAGNOSIS — M5416 Radiculopathy, lumbar region: Secondary | ICD-10-CM

## 2017-03-13 NOTE — Telephone Encounter (Signed)
Patient would like to be put on the cancellation list.  She is having right hand and wrist pain that is running up her arm.  She does not want to cancel her appointment with him on March the 28th.  CB#[phone].  Thank you.

## 2017-03-13 NOTE — Telephone Encounter (Signed)
Placed on cancellation list 

## 2017-03-13 NOTE — Progress Notes (Deleted)
Here for lead pull. No blood thinners since 2/17. Has not noticed any bleeding or drainage. Reports about 75% relief of her pain. Says she is having soreness other places from "overdoing it."

## 2017-03-14 ENCOUNTER — Other Ambulatory Visit (INDEPENDENT_AMBULATORY_CARE_PROVIDER_SITE_OTHER): Payer: Self-pay | Admitting: *Deleted

## 2017-03-14 ENCOUNTER — Ambulatory Visit (INDEPENDENT_AMBULATORY_CARE_PROVIDER_SITE_OTHER): Payer: PPO | Admitting: Physical Medicine and Rehabilitation

## 2017-03-14 ENCOUNTER — Encounter (INDEPENDENT_AMBULATORY_CARE_PROVIDER_SITE_OTHER): Payer: Self-pay | Admitting: Physical Medicine and Rehabilitation

## 2017-03-14 DIAGNOSIS — R748 Abnormal levels of other serum enzymes: Secondary | ICD-10-CM

## 2017-03-14 DIAGNOSIS — M25531 Pain in right wrist: Secondary | ICD-10-CM | POA: Diagnosis not present

## 2017-03-14 DIAGNOSIS — M9901 Segmental and somatic dysfunction of cervical region: Secondary | ICD-10-CM | POA: Diagnosis not present

## 2017-03-14 DIAGNOSIS — M9907 Segmental and somatic dysfunction of upper extremity: Secondary | ICD-10-CM | POA: Diagnosis not present

## 2017-03-14 DIAGNOSIS — N952 Postmenopausal atrophic vaginitis: Secondary | ICD-10-CM | POA: Diagnosis not present

## 2017-03-14 DIAGNOSIS — N812 Incomplete uterovaginal prolapse: Secondary | ICD-10-CM | POA: Diagnosis not present

## 2017-03-14 DIAGNOSIS — N941 Unspecified dyspareunia: Secondary | ICD-10-CM | POA: Diagnosis not present

## 2017-03-14 DIAGNOSIS — R3915 Urgency of urination: Secondary | ICD-10-CM | POA: Diagnosis not present

## 2017-03-14 NOTE — Progress Notes (Signed)
Debbie Pineda - 57 y.o. female MRN [id number]  Date of birth: 03/19/1960  Office Visit Note: Visit Date: 03/13/2017 PCP: Jake Samples, PA-C Referred by: Jake Samples, PA*  Subjective: No chief complaint on file.  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  She comes in today after having had spinal cord stimulator trial placed last Friday.  She reports 75% relief of her low back and bilateral leg pain and is quite pleased with the results.  She reports some achy pain in the joints but she feels like this may have been more from increased activity level since she was feeling so much better.  She has tried and failed other manner of conservative nonconservative care including medication management and chronic opioid therapy as well as prior lumbar spine injections.  She has not had any major back surgery and has had continued facet arthropathy as well as degenerative disc changes of the lumbar spine.  Her case is complicated by history of remote cerebrovascular incident as well as fibromyalgia.  She does take chronic Xarelto.  She did inadvertently take Xarelto last Sunday.  She has not had any since that time.  She will go ahead and stay off the Xarelto 1 more night and.  She has been taking an 81 mg aspirin.    Review of Systems  Constitutional: Negative for chills, fever, malaise/fatigue and weight loss.  HENT: Negative for hearing loss and sinus pain.   Eyes: Negative for blurred vision, double vision and photophobia.  Respiratory: Negative for cough and shortness of breath.   Cardiovascular: Negative for chest pain, palpitations and leg swelling.  Gastrointestinal: Negative for abdominal pain, nausea and vomiting.  Genitourinary: Negative for flank pain.  Musculoskeletal: Positive for back pain and joint pain. Negative for myalgias.  Skin: Negative for itching and rash.  Neurological: Positive for tingling. Negative for tremors, focal weakness and  weakness.  Endo/Heme/Allergies: Negative.   Psychiatric/Behavioral: Negative for depression.  All other systems reviewed and are negative.  Otherwise per HPI.  Assessment & Plan: Visit Diagnoses:  1. Lumbar radiculopathy   2. Degenerative disc disease, lumbar   3. Chronic pain syndrome   4. Neuropathy     Plan: Findings:  Successful spinal cord stimulation trial with 75% relief of her low back and bilateral and referral placed to Dr. Clydell Hakim for his evaluation.  Quick procedure note: Patient was placed prone on the exam table. The external stimulator/generator was unfastened from the leads. The outer Tegaderm was removed from the bandage carefully. There was no noted erythema of the skin or swelling or induration or drainage. There had been some bloody discharge in the gauze pad. Both trial leads were pulled without difficulty and without discomfort. There was no drainage from the insertion site. Band-Aid was applied.  She did report that the use of a very small amount of hydroxyzine did help with the itching that she was having around the bandage.    Meds & Orders: No orders of the defined types were placed in this encounter.   Orders Placed This Encounter  Procedures  . Ambulatory referral to Anesthesiology    Follow-up: Return for Consultation with Dr. Clydell Hakim at [facility].   Procedures: No procedures performed  No notes on file   Clinical History: Lumbar CT myelogram 04/06/2016  IMPRESSION: 1. No acute or focal abnormality to explain the patient's thoracolumbar pain 2. Left paramedian calcified disc protrusion at T12-L1 without  significant stenosis. 3. Mild osseous foraminal narrowing bilaterally at T10-11. 4. Mild bilateral foraminal narrowing at L1-2. 5. Mild left subarticular narrowing at L2-3 with moderate left and mild right foraminal stenosis. 6. Mild right subarticular narrowing at L3-4 with mild foraminal narrowing  bilaterally, right greater than left. 7. Mild foraminal narrowing at L4-5 with moderate bilateral facet hypertrophy but no significant central canal stenosis. 8. Moderate facet hypertrophy and spurring is worse on the left with mild left subarticular narrowing potentially impacting the left S1 nerve root.  Lumbar spine MRI 01/09/2016  L2-3: Mild retrolisthesis. Mild disc and facet degeneration. Mild foraminal narrowing bilaterally.  L3-4: Mild retrolisthesis. Mild disc and facet degeneration. Mild right foraminal narrowing.  L4-5:  Mild disc and facet degeneration without significant stenosis  L5-S1: Moderate to advanced disc degeneration with disc space narrowing. Mild endplate spurring without significant stenosis.  IMPRESSION: Lumbar disc and facet degeneration is stable since 07/20/2014. Negative for disc protrusion or significant spinal stenosis. Mild right foraminal narrowing at L3-4.  She reports that she quit smoking about 26 years ago. Her smoking use included cigarettes. She has a 6.00 pack-year smoking history. she has never used smokeless tobacco. No results for input(s): HGBA1C, LABURIC in the last 8760 hours.  Objective:  VS:  HT:    WT:   BMI:     BP:   HR: bpm  TEMP: ( )  RESP:  Physical Exam  Constitutional: She is oriented to person, place, and time. She appears well-developed and well-nourished.  Eyes: Conjunctivae and EOM are normal. Pupils are equal, round, and reactive to light.  Cardiovascular: Normal rate and intact distal pulses.  Pulmonary/Chest: Effort normal.  Musculoskeletal:  Patient stands and ambulates without aid she does have some pain with extension of the lumbar spine.  She has no groin pain with hip rotation and she has good distal strength.  Neurological: She is alert and oriented to person, place, and time. She exhibits normal muscle tone. Coordination normal.  Skin: Skin is warm and dry. No rash noted. No erythema.  Examining the  skin around the site of the bandage shows no real redness or rash.  There is a very mild area of dry skin around the site of the Steri-Strips.  There is no drainage or induration or bleeding at the site of the injection.  We did clean this area off and provide 2 small Band-Aids.  Psychiatric: She has a normal mood and affect. Her behavior is normal.  Nursing note and vitals reviewed.   Ortho Exam Imaging: No results found.  Past Medical/Family/Surgical/Social History: Medications & Allergies reviewed per EMR Patient Active Problem List   Diagnosis Date Noted  . Presence of right artificial knee joint 09/17/2016  . H/O total knee replacement, right 08/15/2016  . Presence of retained hardware   . Memory disorder 08/14/2016  . Cerebrovascular disease 08/14/2016  . Unilateral primary osteoarthritis, right knee 05/29/2016  . Primary osteoarthritis of both feet 05/29/2016  . Trochanteric bursitis of both hips 05/25/2016  . Neck pain 05/25/2016  . Urinary urgency 04/19/2016  . Chronic low back pain 01/11/2016  . Depression 11/29/2015  . 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 Heart palpitations 12/14/2013  . Generalized osteoarthritis of multiple sites 11/04/2013  . Cerebral infarction ([place]) 10/30/2011  . PFO (patent foramen ovale) 10/30/2011  . Bradycardia 10/30/2011  . Chest pain 10/30/2011  . Hypothyroid   . Thrombophlebitis   . Sleep apnea   . Fibromyalgia   . History of  gastric bypass, 11/20/2010 12/07/2010  . Status post bariatric surgery 12/07/2010  . Vein disorder 11/29/2010  . S/P total hysterectomy and bilateral salpingo-oophorectomy 11/29/2010  . S/P exploratory laparotomy 11/29/2010  . S/P cholecystectomy 11/29/2010  . S/P ACL surgery 11/29/2010  . Morbid obesity ([place]) 11/10/2010   Past Medical History:  Diagnosis Date  . ADD (attention deficit disorder)    takes Adderall daily  . Arthritis    "all over"  . Cerebral infarction ([place]) 10/30/2011  .  Cerebrovascular disease 08/14/2016  . Chronic back pain    "all over"  . Chronic low back pain 01/11/2016  . Complication of anesthesia    tends to have hypotension when NPO and post-anesthesia  . Constipation    takes stool softener daily  . Degenerative disk disease   . Degenerative joint disease   . Depression    takes Cymbalta daily for pain per pt  . DVT (deep venous thrombosis) (HCC)    RLE  . Family history of adverse reaction to anesthesia    a family member woke up during surgery; "think it was my mom"  . Fibromyalgia   . Generalized osteoarthritis of multiple sites 11/04/2013  . GERD (gastroesophageal reflux disease)   . Heart palpitations 12/14/2013  . History of blood clots    superficial  . Hypoglycemia   . Hypothyroid    takes Synthroid daily  . Incomplete emptying of bladder   . Insomnia    takes Trazodone nightly  . Iron deficiency anemia    takes Ferrous Sulfate daily  . Joint pain   . Joint swelling    knees and ankles  . Memory disorder 08/14/2016  . Morbid obesity ([place])   . Nausea    takes Zofran as needed.Seeing GI doc  . Neck pain 05/25/2016  . OSA on CPAP    tested more than 5 yrs ago.    . Osteoarthritis   . PFO (patent foramen ovale)   . Primary osteoarthritis of both feet 05/29/2016   Right bunionectomy August 2017 by Dr. Sharol Given  . Scoliosis   . Sleep apnea   . Stroke [facility]) "several"   right foot weakness; memory issues, black spot right visual field since" (03/23/2015)  . Thrombophlebitis   . Trochanteric bursitis of both hips 05/25/2016  . Unilateral primary osteoarthritis, right knee 05/29/2016  . Urinary urgency 04/19/2016  . Vein disorder 11/29/2010   Family History  Problem Relation Age of Onset  . Heart disease Father   . Cancer Father   . Cancer Mother        skin cancer   . Heart disease Brother   . Cancer Brother   . Diabetes Brother   . Stroke Brother   . Heart disease Sister   . Cancer Maternal Grandfather   . Hypothyroidism  Daughter   . Colon cancer Neg Hx    Past Surgical History:  Procedure Laterality Date  . BUNIONECTOMY Right 08/2015  . CARDIAC CATHETERIZATION     2008.  "it was fine" (not sure why she had it done, and doesn't know where)  . COLONOSCOPY N/A 03/25/2013   Procedure: COLONOSCOPY;  Surgeon: Rogene Houston, MD;  [facility];  Service: Endoscopy;  Laterality: N/A;  930  . ESOPHAGOGASTRODUODENOSCOPY    . EXPLORATORY LAPAROTOMY     "took fallopian tubes out"  . JOINT REPLACEMENT     bil knee   . KNEE ARTHROPLASTY    . KNEE ARTHROSCOPY Left   .  KNEE ARTHROSCOPY W/ ACL RECONSTRUCTION Right    "added pins"  . LAPAROSCOPIC CHOLECYSTECTOMY  ~ 2001  . 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 TOTAL KNEE ARTHROPLASTY Left 03/23/2015   Procedure: TOTAL KNEE ARTHROPLASTY;  Surgeon: Newt Minion, MD;  Location: [place];  Service: Orthopedics;  Laterality: Left;  . TOTAL KNEE ARTHROPLASTY Right 08/15/2016   Procedure: RIGHT TOTAL KNEE ARTHROPLASTY, REMOVAL ACL SCREWS;  Surgeon: Newt Minion, MD;  Location: [place];  Service: Orthopedics;  Laterality: Right;  . TOTAL KNEE ARTHROPLASTY WITH HARDWARE REMOVAL Right   . VAGINAL HYSTERECTOMY    . VARICOSE VEIN SURGERY Right X 2   Social History   Occupational History  . Occupation: Disability  . Occupation: formerly Therapist, sports, [employer]  Tobacco Use  . Smoking status: Former Smoker    Packs/day: 0.75    Years: 8.00    Pack years: 6.00    Types: Cigarettes    Last attempt to quit: 12/01/1990    Years since quitting: 26.3  . Smokeless tobacco: Never Used  . Tobacco comment: quit smoking in the 1990s  Substance and Sexual Activity  . Alcohol use: No    Comment: 03/23/2015 "stopped drinking in 2012 w/gastric bypass; drank socially before bypass"  . Drug use: No  . Sexual activity: Not Currently    Birth control/protection: Surgical

## 2017-03-15 ENCOUNTER — Ambulatory Visit (INDEPENDENT_AMBULATORY_CARE_PROVIDER_SITE_OTHER): Payer: PPO | Admitting: Physical Medicine and Rehabilitation

## 2017-03-17 ENCOUNTER — Other Ambulatory Visit: Payer: Self-pay | Admitting: Neurology

## 2017-03-18 MED ORDER — DONEPEZIL HCL 10 MG PO TABS: 10.0000 mg | ORAL_TABLET | Freq: Every day | ORAL | 3 refills | Status: DC

## 2017-03-18 NOTE — Telephone Encounter (Signed)
Called and spoke with patient. She states she is tolerating donepezil 5mg  tablet with no SE. Agreeable to increase to 10mg  tablet as discussed at last office visit. Advised I will send to [place]. She will call if she has further questions/concerns.

## 2017-03-19 DIAGNOSIS — M25531 Pain in right wrist: Secondary | ICD-10-CM | POA: Diagnosis not present

## 2017-03-19 DIAGNOSIS — M9907 Segmental and somatic dysfunction of upper extremity: Secondary | ICD-10-CM | POA: Diagnosis not present

## 2017-03-19 DIAGNOSIS — M9901 Segmental and somatic dysfunction of cervical region: Secondary | ICD-10-CM | POA: Diagnosis not present

## 2017-03-21 ENCOUNTER — Other Ambulatory Visit (INDEPENDENT_AMBULATORY_CARE_PROVIDER_SITE_OTHER): Payer: Self-pay | Admitting: *Deleted

## 2017-03-21 ENCOUNTER — Encounter (INDEPENDENT_AMBULATORY_CARE_PROVIDER_SITE_OTHER): Payer: Self-pay | Admitting: *Deleted

## 2017-03-21 DIAGNOSIS — R748 Abnormal levels of other serum enzymes: Secondary | ICD-10-CM

## 2017-03-24 DIAGNOSIS — G4733 Obstructive sleep apnea (adult) (pediatric): Secondary | ICD-10-CM | POA: Diagnosis not present

## 2017-03-25 ENCOUNTER — Ambulatory Visit (INDEPENDENT_AMBULATORY_CARE_PROVIDER_SITE_OTHER): Payer: PPO | Admitting: Specialist

## 2017-03-25 ENCOUNTER — Ambulatory Visit (INDEPENDENT_AMBULATORY_CARE_PROVIDER_SITE_OTHER): Payer: PPO

## 2017-03-25 ENCOUNTER — Encounter (INDEPENDENT_AMBULATORY_CARE_PROVIDER_SITE_OTHER): Payer: Self-pay | Admitting: Specialist

## 2017-03-25 ENCOUNTER — Other Ambulatory Visit: Payer: Self-pay | Admitting: Anesthesiology

## 2017-03-25 VITALS — BP 96/59 | Ht 65.0 in | Wt 215.0 lb

## 2017-03-25 DIAGNOSIS — M79641 Pain in right hand: Secondary | ICD-10-CM

## 2017-03-25 DIAGNOSIS — M1811 Unilateral primary osteoarthritis of first carpometacarpal joint, right hand: Secondary | ICD-10-CM

## 2017-03-25 NOTE — Progress Notes (Addendum)
Office Visit Note   Patient: Debbie Pineda           Date of Birth: 10-Jun-1960           MRN: [id number] Visit Date: 03/25/2017              Requested by: Jake Samples, PA-C [address] [place], [place] [address] PCP: Jake Samples, PA-C   Assessment & Plan: Visit Diagnoses:  1. Pain in right hand   2. Arthritis of carpometacarpal [facility]) joint of right thumb     Plan: Patient will follow-up with Dr. Louanne Skye in 4 weeks for recheck.  May need referral to hand specialist.  Follow-Up Instructions: Return in about 6 weeks (around 05/06/2017) for WITH DR NITKA.   Orders:  Orders Placed This Encounter  Procedures  . Hand/UE Inj: R thumb CMC  . XR Hand Complete Right  . CBC  . Rheumatoid Factor  . Uric acid  . Antinuclear Antib (ANA)  . Sed Rate (ESR)  . Ambulatory referral to Orthopedic Surgery   Meds ordered this encounter  Medications  . bupivacaine (MARCAINE) 0.5 % (with pres) injection 0.5 mL  . methylPREDNISolone acetate (DEPO-MEDROL) injection 20 mg      Procedures: Hand/UE Inj: R thumb CMC for osteoarthritis on 03/25/2017 10:20 AM Indications: pain Details: 25 G needle, radial approach Medications: 0.5 mL bupivacaine 0.5 %; 20 mg methylPREDNISolone acetate 40 MG/ML Outcome: tolerated well, no immediate complications Consent was given by the patient. Patient was prepped and draped in the usual sterile fashion.       Clinical Data: No additional findings.   Subjective: Chief Complaint  Patient presents with  . Right Hand - Pain    HPI 57 year old white female who comes in today with complaints of right hand pain.  Pain more localized to the thumb CMC joint.  Pain with lifting and gripping objects.  No complaints of numbness tingling.  Has had some swelling around the joint.  No injury. Review of Systems No current cardiac pulmonary GI GU issues  Objective: Vital Signs: BP (!) 96/59 (BP Location: Left Arm, Patient Position: Sitting)    Ht '5\' 5"'  (1.651 m)   Wt 215 lb (97.5 kg)   BMI 35.78 kg/m   Physical Exam  Constitutional: She is oriented to person, place, and time. No distress.  HENT:  Head: Normocephalic.  Eyes: Pupils are equal, round, and reactive to light.  Pulmonary/Chest: No respiratory distress.  Musculoskeletal:  Right hand she does have some swelling over the [facility] joint.  CMC joint is markedly tender.  Positive CMC grind.  Neuro vascular intact.  Neurological: She is oriented to person, place, and time.  Skin: Skin is warm and dry.    Ortho Exam  Specialty Comments:  No specialty comments available.  Imaging: No results found.   PMFS History: Patient Active Problem List   Diagnosis Date Noted  . Elevated liver enzymes 09/10/2017  . History of diabetes mellitus 06/06/2017  . Primary osteoarthritis of right hand 06/06/2017  . Transaminasemia   . TIA (transient ischemic attack) 05/01/2017  . Dysphasia 05/01/2017  . Chronic pain syndrome 05/01/2017  . Confusion   . Presence of right artificial knee joint 09/17/2016  . H/O total knee replacement, right 08/15/2016  . Presence of retained hardware   . Memory disorder 08/14/2016  . Cerebrovascular disease 08/14/2016  . Unilateral primary osteoarthritis, right knee 05/29/2016  . Primary osteoarthritis of both feet 05/29/2016  . Trochanteric bursitis of  both hips 05/25/2016  . Neck pain 05/25/2016  . Urinary urgency 04/19/2016  . Chronic low back pain 01/11/2016  . Depression 11/29/2015  . 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 Heart palpitations 12/14/2013  . Generalized osteoarthritis of multiple sites 11/04/2013  . Cerebral infarction ([place]) 10/30/2011  . PFO (patent foramen ovale) 10/30/2011  . Bradycardia 10/30/2011  . Chest pain 10/30/2011  . Hypothyroid   . Thrombophlebitis   . Sleep apnea   . Fibromyalgia   . 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 Status post bariatric surgery 12/07/2010  . Vein disorder  11/29/2010  . S/P total hysterectomy and bilateral salpingo-oophorectomy 11/29/2010  . S/P exploratory laparotomy 11/29/2010  . S/P cholecystectomy 11/29/2010  . S/P ACL surgery 11/29/2010  . Morbid obesity ([place]) 11/10/2010   Past Medical History:  Diagnosis Date  . ADD (attention deficit disorder)    takes Adderall daily  . Arthritis    "all over"  . Cerebral infarction ([place]) 10/30/2011  . Cerebrovascular disease 08/14/2016  . Chronic back pain    "all over"  . Chronic low back pain 01/11/2016  . Complication of anesthesia    tends to have hypotension when NPO and post-anesthesia  . Constipation    takes stool softener daily  . Degenerative disk disease   . Degenerative joint disease   . Depression    takes Cymbalta daily for pain per pt  . Diabetes mellitus without complication ([place])   . DVT (deep venous thrombosis) (HCC)    RLE  . Family history of adverse reaction to anesthesia    a family member woke up during surgery; "think it was my mom"  . Fibromyalgia   . Generalized osteoarthritis of multiple sites 11/04/2013  . GERD (gastroesophageal reflux disease)   . Heart palpitations 12/14/2013  . History of blood clots    superficial  . Hypoglycemia   . Hypothyroid    takes Synthroid daily  . Incomplete emptying of bladder   . Insomnia    takes Trazodone nightly  . Iron deficiency anemia    takes Ferrous Sulfate daily  . Joint pain   . Joint swelling    knees and ankles  . Memory disorder 08/14/2016  . Morbid obesity ([place])   . Nausea    takes Zofran as needed.Seeing GI doc  . Neck pain 05/25/2016  . OSA on CPAP    tested more than 5 yrs ago.    . Osteoarthritis   . PFO (patent foramen ovale)   . Primary osteoarthritis of both feet 05/29/2016   Right bunionectomy August 2017 by Dr. Sharol Given  . Scoliosis   . Sleep apnea   . Stroke [facility]) "several"   right foot weakness; memory issues, black spot right visual field since" (03/23/2015)  . Thrombophlebitis   .  Trochanteric bursitis of both hips 05/25/2016  . Unilateral primary osteoarthritis, right knee 05/29/2016  . Urinary urgency 04/19/2016  . Vein disorder 11/29/2010    Family History  Problem Relation Age of Onset  . Heart disease Father   . Cancer Father   . Cancer Mother        skin cancer   . Heart disease Brother   . Cancer Brother   . Diabetes Brother   . Stroke Brother   . Heart disease Sister   . Cancer Maternal Grandfather   . Hypothyroidism Daughter   . Hypertension Other   . Colon cancer Neg Hx     Past Surgical History:  Procedure Laterality Date  . BONE EXCISION Right 08/29/2017   Procedure: right trapezium excision;  Surgeon: Daryll Brod, MD;  Location: [place];  Service: Orthopedics;  Laterality: Right;  . BUNIONECTOMY Right 08/2015  . CARDIAC CATHETERIZATION     2008.  "it was fine" (not sure why she had it done, and doesn't know where)  . CARPOMETACARPEL SUSPENSION PLASTY Right 08/29/2017   Procedure: SUSPENSION PLASTY RIGHT THUMB;  Surgeon: Daryll Brod, MD;  Location: [place];  Service: Orthopedics;  Laterality: Right;  . COLONOSCOPY N/A 03/25/2013   Procedure: COLONOSCOPY;  Surgeon: Rogene Houston, MD;  [facility];  Service: Endoscopy;  Laterality: N/A;  930  . ESOPHAGOGASTRODUODENOSCOPY    . EXPLORATORY LAPAROTOMY     "took fallopian tubes out"  . JOINT REPLACEMENT     bil knee   . KNEE ARTHROPLASTY    . KNEE ARTHROSCOPY Left   . KNEE ARTHROSCOPY W/ ACL RECONSTRUCTION Right    "added pins"  . LAPAROSCOPIC CHOLECYSTECTOMY  ~ 2001  . 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 SPINAL CORD STIMULATOR INSERTION N/A 04/18/2017   Procedure: LUMBAR SPINAL CORD STIMULATOR INSERTION;  Surgeon: Clydell Hakim, MD;  Location: [place];  Service: Neurosurgery;  Laterality: N/A;  LUMBAR SPINAL CORD STIMULATOR INSERTION  . TENDON TRANSFER Right 08/29/2017   Procedure: right abductor pollicis longus transfer;  Surgeon: Daryll Brod, MD;   Location: [place];  Service: Orthopedics;  Laterality: Right;  . TOTAL KNEE ARTHROPLASTY Left 03/23/2015   Procedure: TOTAL KNEE ARTHROPLASTY;  Surgeon: Newt Minion, MD;  Location: [place];  Service: Orthopedics;  Laterality: Left;  . TOTAL KNEE ARTHROPLASTY Right 08/15/2016   Procedure: RIGHT TOTAL KNEE ARTHROPLASTY, REMOVAL ACL SCREWS;  Surgeon: Newt Minion, MD;  Location: [employer];  Service: Orthopedics;  Laterality: Right;  . TOTAL KNEE ARTHROPLASTY WITH HARDWARE REMOVAL Right   . VAGINAL HYSTERECTOMY    . VARICOSE VEIN SURGERY Right X 2   Social History   Occupational History  . Occupation: Disability  . Occupation: formerly Therapist, sports, [employer]  Tobacco Use  . Smoking status: Former Smoker    Packs/day: 0.75    Years: 8.00    Pack years: 6.00    Types: Cigarettes    Last attempt to quit: 12/01/1990    Years since quitting: 26.8  . Smokeless tobacco: Never Used  . Tobacco comment: quit smoking in the 1990s  Substance and Sexual Activity  . Alcohol use: No    Comment: 03/23/2015 "stopped drinking in 2012 w/gastric bypass; drank socially before bypass"  . Drug use: No  . Sexual activity: Not Currently    Birth control/protection: Surgical

## 2017-03-26 DIAGNOSIS — D225 Melanocytic nevi of trunk: Secondary | ICD-10-CM | POA: Diagnosis not present

## 2017-03-26 DIAGNOSIS — L820 Inflamed seborrheic keratosis: Secondary | ICD-10-CM | POA: Diagnosis not present

## 2017-03-26 DIAGNOSIS — Z08 Encounter for follow-up examination after completed treatment for malignant neoplasm: Secondary | ICD-10-CM | POA: Diagnosis not present

## 2017-03-26 DIAGNOSIS — D485 Neoplasm of uncertain behavior of skin: Secondary | ICD-10-CM | POA: Diagnosis not present

## 2017-03-26 DIAGNOSIS — Z85828 Personal history of other malignant neoplasm of skin: Secondary | ICD-10-CM | POA: Diagnosis not present

## 2017-03-26 DIAGNOSIS — L218 Other seborrheic dermatitis: Secondary | ICD-10-CM | POA: Diagnosis not present

## 2017-03-26 LAB — ANA: Anti Nuclear Antibody(ANA): NEGATIVE

## 2017-03-26 LAB — CBC
HCT: 36.5 % (ref 35.0–45.0)
Hemoglobin: 12.3 g/dL (ref 11.7–15.5)
MCH: 32.9 pg (ref 27.0–33.0)
MCHC: 33.7 g/dL (ref 32.0–36.0)
MCV: 97.6 fL (ref 80.0–100.0)
MPV: 10.8 fL (ref 7.5–12.5)
Platelets: 229 10*3/uL (ref 140–400)
RBC: 3.74 10*6/uL — ABNORMAL LOW (ref 3.80–5.10)
RDW: 11.1 % (ref 11.0–15.0)
WBC: 5.9 10*3/uL (ref 3.8–10.8)

## 2017-03-26 LAB — URIC ACID: Uric Acid, Serum: 4.5 mg/dL (ref 2.5–7.0)

## 2017-03-26 LAB — SEDIMENTATION RATE: Sed Rate: 9 mm/h (ref 0–30)

## 2017-03-26 LAB — RHEUMATOID FACTOR: Rhuematoid fact SerPl-aCnc: <14 IU/mL (ref <14)

## 2017-03-29 ENCOUNTER — Telehealth (INDEPENDENT_AMBULATORY_CARE_PROVIDER_SITE_OTHER): Payer: Self-pay | Admitting: Radiology

## 2017-03-29 DIAGNOSIS — R748 Abnormal levels of other serum enzymes: Secondary | ICD-10-CM | POA: Diagnosis not present

## 2017-03-29 LAB — HEPATIC FUNCTION PANEL
AG Ratio: 2 (calc) (ref 1.0–2.5)
ALT: 126 U/L — ABNORMAL HIGH (ref 6–29)
AST: 50 U/L — ABNORMAL HIGH (ref 10–35)
Albumin: 4.3 g/dL (ref 3.6–5.1)
Alkaline phosphatase (APISO): 141 U/L — ABNORMAL HIGH (ref 33–130)
Bilirubin, Direct: 0.1 mg/dL (ref 0.0–0.2)
Globulin: 2.1 g/dL (calc) (ref 1.9–3.7)
Indirect Bilirubin: 0.4 mg/dL (calc) (ref 0.2–1.2)
Total Bilirubin: 0.5 mg/dL (ref 0.2–1.2)
Total Protein: 6.4 g/dL (ref 6.1–8.1)

## 2017-03-29 NOTE — Telephone Encounter (Signed)
Patient is calling to get her labs results, patient would like a copy of the labs mailed to her also.

## 2017-04-01 ENCOUNTER — Other Ambulatory Visit (INDEPENDENT_AMBULATORY_CARE_PROVIDER_SITE_OTHER): Payer: Self-pay | Admitting: *Deleted

## 2017-04-01 ENCOUNTER — Encounter (INDEPENDENT_AMBULATORY_CARE_PROVIDER_SITE_OTHER): Payer: Self-pay | Admitting: *Deleted

## 2017-04-01 DIAGNOSIS — R7401 Elevation of levels of liver transaminase levels: Secondary | ICD-10-CM

## 2017-04-01 DIAGNOSIS — R740 Nonspecific elevation of levels of transaminase and lactic acid dehydrogenase [LDH]: Principal | ICD-10-CM

## 2017-04-02 ENCOUNTER — Other Ambulatory Visit: Payer: Self-pay | Admitting: Rheumatology

## 2017-04-02 ENCOUNTER — Other Ambulatory Visit (INDEPENDENT_AMBULATORY_CARE_PROVIDER_SITE_OTHER): Payer: Self-pay | Admitting: Internal Medicine

## 2017-04-02 DIAGNOSIS — M797 Fibromyalgia: Secondary | ICD-10-CM

## 2017-04-02 NOTE — Telephone Encounter (Signed)
Last Visit: 12/10/16 Next Visit: 06/06/17  Okay to refill per Dr. Estanislado Pandy

## 2017-04-03 DIAGNOSIS — M545 Low back pain: Secondary | ICD-10-CM | POA: Diagnosis not present

## 2017-04-03 DIAGNOSIS — G894 Chronic pain syndrome: Secondary | ICD-10-CM | POA: Diagnosis not present

## 2017-04-05 DIAGNOSIS — M1811 Unilateral primary osteoarthritis of first carpometacarpal joint, right hand: Secondary | ICD-10-CM | POA: Diagnosis not present

## 2017-04-05 DIAGNOSIS — M79644 Pain in right finger(s): Secondary | ICD-10-CM | POA: Diagnosis not present

## 2017-04-05 NOTE — Telephone Encounter (Signed)
I called report of the labs Jeneen Rinks ordered on this patient to her liver function test are still slightly elevated but better than in the past. ANA, RA are negative. I printed a copy of the lab for you to slip in an envelope and send to her. Thanks. jen

## 2017-04-08 NOTE — Telephone Encounter (Signed)
Mailed to patient

## 2017-04-10 DIAGNOSIS — L988 Other specified disorders of the skin and subcutaneous tissue: Secondary | ICD-10-CM | POA: Diagnosis not present

## 2017-04-10 DIAGNOSIS — G4733 Obstructive sleep apnea (adult) (pediatric): Secondary | ICD-10-CM | POA: Diagnosis not present

## 2017-04-10 DIAGNOSIS — D485 Neoplasm of uncertain behavior of skin: Secondary | ICD-10-CM | POA: Diagnosis not present

## 2017-04-10 DIAGNOSIS — D225 Melanocytic nevi of trunk: Secondary | ICD-10-CM | POA: Diagnosis not present

## 2017-04-10 DIAGNOSIS — L821 Other seborrheic keratosis: Secondary | ICD-10-CM | POA: Diagnosis not present

## 2017-04-10 DIAGNOSIS — K130 Diseases of lips: Secondary | ICD-10-CM | POA: Diagnosis not present

## 2017-04-10 NOTE — Pre-Procedure Instructions (Signed)
Debbie Pineda  04/10/2017      [place] [facility] - [place], [place] - [address] [place] [address] Phone: [phone] Fax: [phone]    Your procedure is scheduled on Thurs. March 28  Report to [facility] Admitting at 10:00 A.M.  Call this number if you have problems the morning of surgery:  [phone]   Remember:  Do not eat food or drink liquids after midnight on Wed. March 27   Take these medicines the morning of surgery with A SIP OF WATER : baclofen (lioresal) if needed, duloxetine (cymbalta), gabapentin (neurontin), hydroxyzine (atarax/vistaril) if needed, levothyroxine (synthroid),methocarbamol (robaxin), mirabergron (myrbetriq), omeprazole (prilosec), zofran if needed, oxycodone if needed, topiramate (topamax), tramadol if needed,                  7 days prior to surgery STOP taking any Aspirin(unless otherwise instructed by your surgeon), Aleve, Naproxen, Ibuprofen, Motrin, Advil, Goody's, BC's, all herbal medications, fish oil, and all vitamins, voltaren gel, krill oil,                 Stop xarelto  Per Dr. Maryjean Ka     Do not wear jewelry, make-up or nail polish.  Do not wear lotions, powders, or perfumes, or deodorant.  Do not shave 48 hours prior to surgery.  Men may shave face and neck.  Do not bring valuables to the hospital.  [facility] is not responsible for any belongings or valuables.  Contacts, dentures or bridgework may not be worn into surgery.  Leave your suitcase in the car.  After surgery it may be brought to your room.  For patients admitted to the hospital, discharge time will be determined by your treatment team.  Patients discharged the day of surgery will not be allowed to drive home.    Special instructions:  [place]- Preparing For Surgery  Before surgery, you can play an important role. Because skin is not sterile, your skin needs to be as free of germs as possible. You can  reduce the number of germs on your skin by washing with CHG (chlorahexidine gluconate) Soap before surgery.  CHG is an antiseptic cleaner which kills germs and bonds with the skin to continue killing germs even after washing.  Please do not use if you have an allergy to CHG or antibacterial soaps. If your skin becomes reddened/irritated stop using the CHG.  Do not shave (including legs and underarms) for at least 48 hours prior to first CHG shower. It is OK to shave your face.  Please follow these instructions carefully.   1. Shower the NIGHT BEFORE SURGERY and the MORNING OF SURGERY with CHG.   2. If you chose to wash your hair, wash your hair first as usual with your normal shampoo.  3. After you shampoo, rinse your hair and body thoroughly to remove the shampoo.  4. Use CHG as you would any other liquid soap. You can apply CHG directly to the skin and wash gently with a scrungie or a clean washcloth.   5. Apply the CHG Soap to your body ONLY FROM THE NECK DOWN.  Do not use on open wounds or open sores. Avoid contact with your eyes, ears, mouth and genitals (private parts). Wash Face and genitals (private parts)  with your normal soap.  6. Wash thoroughly, paying special attention to the area where your surgery will be performed.  7. Thoroughly rinse your body with  warm water from the neck down.  8. DO NOT shower/wash with your normal soap after using and rinsing off the CHG Soap.  9. Pat yourself dry with a CLEAN TOWEL.  10. Wear CLEAN PAJAMAS to bed the night before surgery, wear comfortable clothes the morning of surgery  11. Place CLEAN SHEETS on your bed the night of your first shower and DO NOT SLEEP WITH PETS.    Day of Surgery: Do not apply any deodorants/lotions. Please wear clean clothes to the hospital/surgery center.      Please read over the following fact sheets that you were given. Coughing and Deep Breathing, MRSA Information and Surgical Site Infection  Prevention

## 2017-04-11 ENCOUNTER — Other Ambulatory Visit: Payer: Self-pay

## 2017-04-11 ENCOUNTER — Encounter (HOSPITAL_COMMUNITY): Payer: Self-pay

## 2017-04-11 ENCOUNTER — Encounter (HOSPITAL_COMMUNITY)
Admission: RE | Admit: 2017-04-11 | Discharge: 2017-04-11 | Disposition: A | Discharge status: Home / Self Care | Payer: PPO | Source: Ambulatory Visit | Attending: Anesthesiology | Admitting: Anesthesiology

## 2017-04-11 DIAGNOSIS — Z9049 Acquired absence of other specified parts of digestive tract: Secondary | ICD-10-CM | POA: Insufficient documentation

## 2017-04-11 DIAGNOSIS — Z01818 Encounter for other preprocedural examination: Secondary | ICD-10-CM | POA: Insufficient documentation

## 2017-04-11 DIAGNOSIS — Z90710 Acquired absence of both cervix and uterus: Secondary | ICD-10-CM | POA: Diagnosis not present

## 2017-04-11 DIAGNOSIS — M797 Fibromyalgia: Secondary | ICD-10-CM | POA: Insufficient documentation

## 2017-04-11 DIAGNOSIS — G894 Chronic pain syndrome: Secondary | ICD-10-CM | POA: Diagnosis not present

## 2017-04-11 DIAGNOSIS — R739 Hyperglycemia, unspecified: Secondary | ICD-10-CM | POA: Diagnosis not present

## 2017-04-11 DIAGNOSIS — G629 Polyneuropathy, unspecified: Secondary | ICD-10-CM | POA: Insufficient documentation

## 2017-04-11 DIAGNOSIS — K219 Gastro-esophageal reflux disease without esophagitis: Secondary | ICD-10-CM | POA: Insufficient documentation

## 2017-04-11 DIAGNOSIS — M5116 Intervertebral disc disorders with radiculopathy, lumbar region: Secondary | ICD-10-CM | POA: Diagnosis not present

## 2017-04-11 DIAGNOSIS — F329 Major depressive disorder, single episode, unspecified: Secondary | ICD-10-CM | POA: Diagnosis not present

## 2017-04-11 DIAGNOSIS — E039 Hypothyroidism, unspecified: Secondary | ICD-10-CM | POA: Diagnosis not present

## 2017-04-11 DIAGNOSIS — Z87891 Personal history of nicotine dependence: Secondary | ICD-10-CM | POA: Insufficient documentation

## 2017-04-11 LAB — CBC
HCT: 43.6 % (ref 36.0–46.0)
Hemoglobin: 14.5 g/dL (ref 12.0–15.0)
MCH: 33 pg (ref 26.0–34.0)
MCHC: 33.3 g/dL (ref 30.0–36.0)
MCV: 99.1 fL (ref 78.0–100.0)
Platelets: 232 10*3/uL (ref 150–400)
RBC: 4.4 MIL/uL (ref 3.87–5.11)
RDW: 12.2 % (ref 11.5–15.5)
WBC: 9.6 10*3/uL (ref 4.0–10.5)

## 2017-04-11 LAB — BASIC METABOLIC PANEL
Anion gap: 8 (ref 5–15)
BUN: 14 mg/dL (ref 6–20)
CO2: 25 mmol/L (ref 22–32)
Calcium: 9.7 mg/dL (ref 8.9–10.3)
Chloride: 106 mmol/L (ref 101–111)
Creatinine, Ser: 0.93 mg/dL (ref 0.44–1.00)
GFR calc Af Amer: >60 mL/min (ref >60)
GFR calc non Af Amer: >60 mL/min (ref >60)
Glucose, Bld: 72 mg/dL (ref 65–99)
Potassium: 4.1 mmol/L (ref 3.5–5.1)
Sodium: 139 mmol/L (ref 135–145)

## 2017-04-11 LAB — SURGICAL PCR SCREEN
MRSA, PCR: NEGATIVE
Staphylococcus aureus: POSITIVE — AB

## 2017-04-11 NOTE — Progress Notes (Signed)
Surgical PCR +staph. Notified patient. Patient stated she picked up a Rx for Mupirocin yesterday and would start it tonight.

## 2017-04-11 NOTE — Progress Notes (Signed)
PCP - PA Delman Cheadle  Cardiologist - Dr. Bronson Ing- Has not seen yet  Chest x-ray - Denies  EKG - 08/06/16 (E)  Stress Test - 12/01/10 (CE)  ECHO - 10/2011 (E)  Cardiac Cath - 2008- Negative  Sleep Study - Yes- Positive CPAP - Yes  LABS- 04/11/17: CBC, BMP 04/18/17: PT, PTT  Pt sts she is to stop Xarelto on 3/24, and start Aspirin from 3/25-3/31.  Anesthesia- Yes- History  Pt denies having chest pain, sob, or fever at this time. All instructions explained to the pt, with a verbal understanding of the material. Pt agrees to go over the instructions while at home for a better understanding. The opportunity to ask questions was provided.

## 2017-04-12 DIAGNOSIS — E6609 Other obesity due to excess calories: Secondary | ICD-10-CM | POA: Diagnosis not present

## 2017-04-12 DIAGNOSIS — Z22322 Carrier or suspected carrier of Methicillin resistant Staphylococcus aureus: Secondary | ICD-10-CM | POA: Diagnosis not present

## 2017-04-12 DIAGNOSIS — E119 Type 2 diabetes mellitus without complications: Secondary | ICD-10-CM | POA: Diagnosis not present

## 2017-04-12 DIAGNOSIS — Z6834 Body mass index (BMI) 34.0-34.9, adult: Secondary | ICD-10-CM | POA: Diagnosis not present

## 2017-04-12 DIAGNOSIS — Z1389 Encounter for screening for other disorder: Secondary | ICD-10-CM | POA: Diagnosis not present

## 2017-04-12 LAB — HEPATIC FUNCTION PANEL
ALT: 51 U/L (ref 14–54)
AST: 47 U/L — ABNORMAL HIGH (ref 15–41)
Albumin: 4.4 g/dL (ref 3.5–5.0)
Alkaline Phosphatase: 112 U/L (ref 38–126)
Bilirubin, Direct: <0.1 mg/dL — ABNORMAL LOW (ref 0.1–0.5)
Total Bilirubin: 0.7 mg/dL (ref 0.3–1.2)
Total Protein: 7.2 g/dL (ref 6.5–8.1)

## 2017-04-12 NOTE — Progress Notes (Signed)
Anesthesia Chart Review: Patient is a 57  year old female scheduled for lumbar spinal cord stimulator insertion on 04/18/17 by Dr. Clydell Hakim. She is s/p right TKA, removal ACL screws on 08/15/16.  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 Reported one of her parents "woke up during surgery" and had issues with nausea. She reports that she tends to be hypotensive when NPO and post-anesthesia. BMI is consistent with obesity. Patient is a retired Marine scientist.  - PCP is listed as Delman Cheadle, PA-C with [facility].   - Neurologist is Dr. Margette Fast at [facility], last visit 12/23/16. She was previously seen at [facility]). - Cardiologist is Dr. Shade Flood with [facility] (Care Everywhere), last visit 03/19/16. He is a structural heart disease specialist and was following for history of PFO and CVA. According to his notes, previous CVA work-up in 2009 showed "no carotid/vertebral artery atherosclerosis, no atrial fibrillation, and no hypercoagulable state by laboratory evaluation." 10/2011 CVA was in the setting of subtherapeutic INR with history of RLE GSV thrombus. He writes, "She was also seen in consultation by our hematology coagulation group who felt that lifelong anticoaguation and not PFO closure was the best management strategy." Of note on 08/10/17, anesthesiologist Dr. Roberts Gaudy called and spoke with Dr. Sharol Roussel and confirmed that he did not think PFO  closure was indicated prior to knee replacement. Permission also given to hold Xarelto for 72 hours for that surgery. At that time patient reported PCP was managing Xarelto. (Per PAT RN note, patient plans to see Dr. Kate Sable in the future for follow-up.) - GI is Dr. Hildred Laser with [facility]. Last seen 03/12/17 after ED visit for significantly elevated LFTs, possible related to Tylenol overuse.   Meds include amoxicillin (PRN dental procedures), ASA 81 mg (taking 3/25-3/31/19), baclofen, Aricept, Cymbalta, ferrous sulfate, Diflucan PRN, folic acid, Neurontin, [place], krill oil, Cerefolin, levothyroxine, Robaxin, Ritalin, Myrbetriq, Prilosec, Zofran-ODT, oxycodone, Xarelto (hodling 04/14/17), Topamax, tramadol, trazodone.    EKG 08/06/16: SB at 53 bpm. Isolated Q wave in III.  TEE 12/06/11 ([facility]; Care Everywhere):  CONCLUSIONS ------------------------------------------------------------------ 1. Bi directional PFO present, with interatrial aneurysm 2. Normal LVEF 3. No LA/LAA thrombus OR RA/RAA THROMBUS 4. Trivial MR, TR  Stress echo 12/01/10 ([facility]; Care Everywhere): INTERPRETATION --------------------------------------------------------------- Interpretation: Normal Stress Echocardiogram. Note: Trivial TR, PR Normal diastolic function Poor sound transmission; Unable to use imaging agent due to relative contraindication with known PFO No obvious wall motion abnormalities seen despite poor sound transmission  RHC/LHC 11/30/05 (Dr. Jenkins Rouge): IMPRESSION: The patient has no evidence of pulmonary hypertension. Herfilling pressures were normal. There is no significant coronary arterydisease (LM, LAD, CX, RCA "normal"). She has normal LV function.  Carotid U/S 10/31/11: IMPRESSION: Minor carotid intimal thickening. No significant ICA stenosis by ultrasound. Vertebral artery flow was antegrade.  MRI brain 01/26/17: IMPRESSION:   This MRI of the brain without contrast shows the following: 1.     Remote stroke in the left posterior medial temporal lobe and left medial occipital lobe in the distribution of the left posterior cerebral artery. The  stroke was acute on the 10/31/2011 MRI.  2.     Small lacunar infarction in the right cerebellar hemisphere that was present on the 2013 MRI and small lacunar infarction in the left cerebellar hemisphere that was not present on the older MRI. 3.     Brain volume is normal for age and there is no significant chronic microvascular ischemic change. 4.     There are no acute findings.  Preoperative labs noted. CBC and BMET WNL. Since her LFTs were elevated last month HFP added to re-evaluate--results showed ALT had normalized and AST only minimally elevated at 47. She is for a PT/PTT on the day of surgery.   If no acute changes then I anticipate that she can proceed as planned.  [facility] Short Stay Center/Anesthesiology Phone [phone] 04/12/2017 5:12 PM

## 2017-04-15 DIAGNOSIS — M62838 Other muscle spasm: Secondary | ICD-10-CM | POA: Diagnosis not present

## 2017-04-15 DIAGNOSIS — M6281 Muscle weakness (generalized): Secondary | ICD-10-CM | POA: Diagnosis not present

## 2017-04-15 DIAGNOSIS — R102 Pelvic and perineal pain: Secondary | ICD-10-CM | POA: Diagnosis not present

## 2017-04-16 ENCOUNTER — Encounter (INDEPENDENT_AMBULATORY_CARE_PROVIDER_SITE_OTHER): Payer: Self-pay | Admitting: Orthopedic Surgery

## 2017-04-16 ENCOUNTER — Ambulatory Visit (INDEPENDENT_AMBULATORY_CARE_PROVIDER_SITE_OTHER): Payer: PPO

## 2017-04-16 ENCOUNTER — Ambulatory Visit (INDEPENDENT_AMBULATORY_CARE_PROVIDER_SITE_OTHER): Payer: PPO | Admitting: Orthopedic Surgery

## 2017-04-16 VITALS — Ht 65.0 in | Wt 210.0 lb

## 2017-04-16 DIAGNOSIS — R102 Pelvic and perineal pain: Secondary | ICD-10-CM | POA: Diagnosis not present

## 2017-04-16 DIAGNOSIS — M25552 Pain in left hip: Secondary | ICD-10-CM | POA: Diagnosis not present

## 2017-04-16 DIAGNOSIS — M7061 Trochanteric bursitis, right hip: Secondary | ICD-10-CM

## 2017-04-16 DIAGNOSIS — K5900 Constipation, unspecified: Secondary | ICD-10-CM | POA: Diagnosis not present

## 2017-04-16 DIAGNOSIS — M7062 Trochanteric bursitis, left hip: Secondary | ICD-10-CM

## 2017-04-16 DIAGNOSIS — M62838 Other muscle spasm: Secondary | ICD-10-CM | POA: Diagnosis not present

## 2017-04-16 DIAGNOSIS — M25551 Pain in right hip: Secondary | ICD-10-CM

## 2017-04-16 DIAGNOSIS — M6289 Other specified disorders of muscle: Secondary | ICD-10-CM | POA: Diagnosis not present

## 2017-04-16 DIAGNOSIS — M6281 Muscle weakness (generalized): Secondary | ICD-10-CM | POA: Diagnosis not present

## 2017-04-16 DIAGNOSIS — N941 Unspecified dyspareunia: Secondary | ICD-10-CM | POA: Diagnosis not present

## 2017-04-16 NOTE — Progress Notes (Signed)
Office Visit Note   Patient: Debbie Pineda           Date of Birth: Oct 12, 1960           MRN: [id number] Visit Date: 04/16/2017              Requested by: Jake Samples, PA-C [address] [place], [place] [address] PCP: Jake Samples, PA-C  Chief Complaint  Patient presents with  . Right Hip - Pain  . Left Hip - Pain      HPI: Patient is a 57 year old woman who presents with recurrent bilateral hip bursitis.  She states she has had injections in the past which is been helpful.  Patient states that she has a temporary spinal cord stimulator which works and why she is laying on her back and then it stops working.  Patient states she has pain when she lays on her hips.  Assessment & Plan: Visit Diagnoses:  1. Pain in left hip   2. Pain in right hip   3. Trochanteric bursitis, left hip   4. Trochanteric bursitis, right hip     Plan: Bilateral greater trochanter bursa is were injected she tolerated this well she will follow-up with Dr. Maryjean Ka for her spinal cord stimulator.  Patient also follows up with Dr. Estanislado Pandy for injections of her trapezius muscle with history of migraine headaches.  Follow-Up Instructions: Return if symptoms worsen or fail to improve.   Ortho Exam  Patient is alert, oriented, no adenopathy, well-dressed, normal affect, normal respiratory effort. Examination patient has a normal gait there is no pain with range of motion of the hip knee or ankle she has a negative straight leg raise bilaterally.  She is tender to palpation over the greater trochanter bursa bilaterally.  Imaging: Xr Pelvis 1-2 Views  Result Date: 04/16/2017 AP of the pelvis no degenerative changes of the hip bilaterally.  There is no calcification over the greater trochanter bursitis bursa.  Patient does have degenerative changes through the lower lumbar spine.  No images are attached to the encounter.  Labs: Lab Results  Component Value Date   HGBA1C 5.4  10/31/2011   ESRSEDRATE 9 03/25/2017   LABURIC 4.5 03/25/2017    @LABSALLVALUES (HGBA1)@  Body mass index is 34.95 kg/m.  Orders:  Orders Placed This Encounter  Procedures  . XR Pelvis 1-2 Views   No orders of the defined types were placed in this encounter.    Procedures: Large Joint Inj: bilateral greater trochanter on 04/16/2017 4:36 PM Indications: pain and diagnostic evaluation Details: 22 G 1.5 in needle, lateral approach  Arthrogram: No  Outcome: tolerated well, no immediate complications Procedure, treatment alternatives, risks and benefits explained, specific risks discussed. Consent was given by the patient. Immediately prior to procedure a time out was called to verify the correct patient, procedure, equipment, support staff and site/side marked as required. Patient was prepped and draped in the usual sterile fashion.      Clinical Data: No additional findings.  ROS:  All other systems negative, except as noted in the HPI. Review of Systems  Objective: Vital Signs: Ht 5\' 5"  (1.651 m)   Wt 210 lb (95.3 kg)   BMI 34.95 kg/m   Specialty Comments:  No specialty comments available.  PMFS History: Patient Active Problem List   Diagnosis Date Noted  . Presence of right artificial knee joint 09/17/2016  . H/O total knee replacement, right 08/15/2016  . Presence of retained hardware   . Memory  disorder 08/14/2016  . Cerebrovascular disease 08/14/2016  . Unilateral primary osteoarthritis, right knee 05/29/2016  . Primary osteoarthritis of both feet 05/29/2016  . Trochanteric bursitis of both hips 05/25/2016  . Neck pain 05/25/2016  . Urinary urgency 04/19/2016  . Chronic low back pain 01/11/2016  . Depression 11/29/2015  . 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 Heart palpitations 12/14/2013  . Generalized osteoarthritis of multiple sites 11/04/2013  . Cerebral infarction ([place]) 10/30/2011  . PFO (patent foramen ovale) 10/30/2011  . Bradycardia  10/30/2011  . Chest pain 10/30/2011  . Hypothyroid   . Thrombophlebitis   . Sleep apnea   . Fibromyalgia   . 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 Status post bariatric surgery 12/07/2010  . Vein disorder 11/29/2010  . S/P total hysterectomy and bilateral salpingo-oophorectomy 11/29/2010  . S/P exploratory laparotomy 11/29/2010  . S/P cholecystectomy 11/29/2010  . S/P ACL surgery 11/29/2010  . Morbid obesity ([place]) 11/10/2010   Past Medical History:  Diagnosis Date  . ADD (attention deficit disorder)    takes Adderall daily  . Arthritis    "all over"  . Cerebral infarction ([place]) 10/30/2011  . Cerebrovascular disease 08/14/2016  . Chronic back pain    "all over"  . Chronic low back pain 01/11/2016  . Complication of anesthesia    tends to have hypotension when NPO and post-anesthesia  . Constipation    takes stool softener daily  . Degenerative disk disease   . Degenerative joint disease   . Depression    takes Cymbalta daily for pain per pt  . DVT (deep venous thrombosis) ([facility])    RLE  . Family history of adverse reaction to anesthesia    a family member woke up during surgery; "think it was my mom"  . Fibromyalgia   . Generalized osteoarthritis of multiple sites 11/04/2013  . GERD (gastroesophageal reflux disease)   . Heart palpitations 12/14/2013  . History of blood clots    superficial  . Hypoglycemia   . Hypothyroid    takes Synthroid daily  . Incomplete emptying of bladder   . Insomnia    takes Trazodone nightly  . Iron deficiency anemia    takes Ferrous Sulfate daily  . Joint pain   . Joint swelling    knees and ankles  . Memory disorder 08/14/2016  . Morbid obesity ([place])   . Nausea    takes Zofran as needed.Seeing GI doc  . Neck pain 05/25/2016  . OSA on CPAP    tested more than 5 yrs ago.    . Osteoarthritis   . PFO (patent foramen ovale)   . Primary osteoarthritis of both feet 05/29/2016   Right bunionectomy August 2017 by Dr. Sharol Given  .  Scoliosis   . Sleep apnea   . Stroke [facility]) "several"   right foot weakness; memory issues, black spot right visual field since" (03/23/2015)  . Thrombophlebitis   . Trochanteric bursitis of both hips 05/25/2016  . Unilateral primary osteoarthritis, right knee 05/29/2016  . Urinary urgency 04/19/2016  . Vein disorder 11/29/2010    Family History  Problem Relation Age of Onset  . Heart disease Father   . Cancer Father   . Cancer Mother        skin cancer   . Heart disease Brother   . Cancer Brother   . Diabetes Brother   . Stroke Brother   . Heart disease Sister   . Cancer Maternal Grandfather   . Hypothyroidism Daughter   .  Colon cancer Neg Hx     Past Surgical History:  Procedure Laterality Date  . BUNIONECTOMY Right 08/2015  . CARDIAC CATHETERIZATION     2008.  "it was fine" (not sure why she had it done, and doesn't know where)  . COLONOSCOPY N/A 03/25/2013   Procedure: COLONOSCOPY;  Surgeon: Rogene Houston, MD;  Location: [facility];  Service: Endoscopy;  Laterality: N/A;  930  . ESOPHAGOGASTRODUODENOSCOPY    . EXPLORATORY LAPAROTOMY     "took fallopian tubes out"  . JOINT REPLACEMENT     bil knee   . KNEE ARTHROPLASTY    . KNEE ARTHROSCOPY Left   . KNEE ARTHROSCOPY W/ ACL RECONSTRUCTION Right    "added pins"  . LAPAROSCOPIC CHOLECYSTECTOMY  ~ 2001  . 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 TOTAL KNEE ARTHROPLASTY Left 03/23/2015   Procedure: TOTAL KNEE ARTHROPLASTY;  Surgeon: Newt Minion, MD;  Location: [place];  Service: Orthopedics;  Laterality: Left;  . TOTAL KNEE ARTHROPLASTY Right 08/15/2016   Procedure: RIGHT TOTAL KNEE ARTHROPLASTY, REMOVAL ACL SCREWS;  Surgeon: Newt Minion, MD;  Location: [place];  Service: Orthopedics;  Laterality: Right;  . TOTAL KNEE ARTHROPLASTY WITH HARDWARE REMOVAL Right   . VAGINAL HYSTERECTOMY    . VARICOSE VEIN SURGERY Right X 2   Social History   Occupational History  . Occupation: Disability  . Occupation: formerly Therapist, sports, [employer]    Tobacco Use  . Smoking status: Former Smoker    Packs/day: 0.75    Years: 8.00    Pack years: 6.00    Types: Cigarettes    Last attempt to quit: 12/01/1990    Years since quitting: 26.3  . Smokeless tobacco: Never Used  . Tobacco comment: quit smoking in the 1990s  Substance and Sexual Activity  . Alcohol use: No    Comment: 03/23/2015 "stopped drinking in 2012 w/gastric bypass; drank socially before bypass"  . Drug use: No  . Sexual activity: Not Currently    Birth control/protection: Surgical

## 2017-04-17 DIAGNOSIS — D485 Neoplasm of uncertain behavior of skin: Secondary | ICD-10-CM | POA: Diagnosis not present

## 2017-04-17 DIAGNOSIS — L98429 Non-pressure chronic ulcer of back with unspecified severity: Secondary | ICD-10-CM | POA: Diagnosis not present

## 2017-04-18 ENCOUNTER — Ambulatory Visit (HOSPITAL_COMMUNITY): Payer: PPO | Admitting: Vascular Surgery

## 2017-04-18 ENCOUNTER — Encounter (HOSPITAL_COMMUNITY): Payer: Self-pay

## 2017-04-18 ENCOUNTER — Encounter (HOSPITAL_COMMUNITY)
Admission: RE | Disposition: A | Discharge status: Home / Self Care | Payer: Self-pay | Source: Ambulatory Visit | Attending: Anesthesiology

## 2017-04-18 ENCOUNTER — Ambulatory Visit (HOSPITAL_COMMUNITY): Payer: PPO

## 2017-04-18 ENCOUNTER — Ambulatory Visit (INDEPENDENT_AMBULATORY_CARE_PROVIDER_SITE_OTHER): Payer: PPO | Admitting: Specialist

## 2017-04-18 ENCOUNTER — Ambulatory Visit (HOSPITAL_COMMUNITY)
Admission: RE | Admit: 2017-04-18 | Discharge: 2017-04-18 | Disposition: A | Discharge status: Home / Self Care | Payer: PPO | Source: Ambulatory Visit | Attending: Anesthesiology | Admitting: Anesthesiology

## 2017-04-18 DIAGNOSIS — Z888 Allergy status to other drugs, medicaments and biological substances status: Secondary | ICD-10-CM | POA: Insufficient documentation

## 2017-04-18 DIAGNOSIS — Z90710 Acquired absence of both cervix and uterus: Secondary | ICD-10-CM | POA: Insufficient documentation

## 2017-04-18 DIAGNOSIS — F329 Major depressive disorder, single episode, unspecified: Secondary | ICD-10-CM | POA: Diagnosis not present

## 2017-04-18 DIAGNOSIS — Z833 Family history of diabetes mellitus: Secondary | ICD-10-CM | POA: Insufficient documentation

## 2017-04-18 DIAGNOSIS — Z885 Allergy status to narcotic agent status: Secondary | ICD-10-CM | POA: Insufficient documentation

## 2017-04-18 DIAGNOSIS — M545 Low back pain: Secondary | ICD-10-CM | POA: Insufficient documentation

## 2017-04-18 DIAGNOSIS — Z87891 Personal history of nicotine dependence: Secondary | ICD-10-CM | POA: Diagnosis not present

## 2017-04-18 DIAGNOSIS — Z7901 Long term (current) use of anticoagulants: Secondary | ICD-10-CM | POA: Insufficient documentation

## 2017-04-18 DIAGNOSIS — I679 Cerebrovascular disease, unspecified: Secondary | ICD-10-CM | POA: Insufficient documentation

## 2017-04-18 DIAGNOSIS — D509 Iron deficiency anemia, unspecified: Secondary | ICD-10-CM | POA: Insufficient documentation

## 2017-04-18 DIAGNOSIS — Z96653 Presence of artificial knee joint, bilateral: Secondary | ICD-10-CM | POA: Diagnosis not present

## 2017-04-18 DIAGNOSIS — Z9049 Acquired absence of other specified parts of digestive tract: Secondary | ICD-10-CM | POA: Diagnosis not present

## 2017-04-18 DIAGNOSIS — Z7982 Long term (current) use of aspirin: Secondary | ICD-10-CM | POA: Insufficient documentation

## 2017-04-18 DIAGNOSIS — K5900 Constipation, unspecified: Secondary | ICD-10-CM | POA: Insufficient documentation

## 2017-04-18 DIAGNOSIS — Z8673 Personal history of transient ischemic attack (TIA), and cerebral infarction without residual deficits: Secondary | ICD-10-CM | POA: Insufficient documentation

## 2017-04-18 DIAGNOSIS — E039 Hypothyroidism, unspecified: Secondary | ICD-10-CM | POA: Insufficient documentation

## 2017-04-18 DIAGNOSIS — Z9884 Bariatric surgery status: Secondary | ICD-10-CM | POA: Insufficient documentation

## 2017-04-18 DIAGNOSIS — Z808 Family history of malignant neoplasm of other organs or systems: Secondary | ICD-10-CM | POA: Insufficient documentation

## 2017-04-18 DIAGNOSIS — Z809 Family history of malignant neoplasm, unspecified: Secondary | ICD-10-CM | POA: Diagnosis not present

## 2017-04-18 DIAGNOSIS — Z86718 Personal history of other venous thrombosis and embolism: Secondary | ICD-10-CM | POA: Diagnosis not present

## 2017-04-18 DIAGNOSIS — Z8349 Family history of other endocrine, nutritional and metabolic diseases: Secondary | ICD-10-CM | POA: Insufficient documentation

## 2017-04-18 DIAGNOSIS — G894 Chronic pain syndrome: Secondary | ICD-10-CM | POA: Diagnosis not present

## 2017-04-18 DIAGNOSIS — Z882 Allergy status to sulfonamides status: Secondary | ICD-10-CM | POA: Insufficient documentation

## 2017-04-18 DIAGNOSIS — Z8249 Family history of ischemic heart disease and other diseases of the circulatory system: Secondary | ICD-10-CM | POA: Insufficient documentation

## 2017-04-18 DIAGNOSIS — M159 Polyosteoarthritis, unspecified: Secondary | ICD-10-CM | POA: Diagnosis not present

## 2017-04-18 DIAGNOSIS — G4700 Insomnia, unspecified: Secondary | ICD-10-CM | POA: Diagnosis not present

## 2017-04-18 DIAGNOSIS — M797 Fibromyalgia: Secondary | ICD-10-CM | POA: Insufficient documentation

## 2017-04-18 DIAGNOSIS — M5410 Radiculopathy, site unspecified: Secondary | ICD-10-CM | POA: Diagnosis not present

## 2017-04-18 DIAGNOSIS — F988 Other specified behavioral and emotional disorders with onset usually occurring in childhood and adolescence: Secondary | ICD-10-CM | POA: Diagnosis not present

## 2017-04-18 DIAGNOSIS — Z6836 Body mass index (BMI) 36.0-36.9, adult: Secondary | ICD-10-CM | POA: Insufficient documentation

## 2017-04-18 DIAGNOSIS — I82409 Acute embolism and thrombosis of unspecified deep veins of unspecified lower extremity: Secondary | ICD-10-CM | POA: Diagnosis not present

## 2017-04-18 DIAGNOSIS — Z981 Arthrodesis status: Secondary | ICD-10-CM | POA: Diagnosis not present

## 2017-04-18 DIAGNOSIS — Z96651 Presence of right artificial knee joint: Secondary | ICD-10-CM | POA: Insufficient documentation

## 2017-04-18 DIAGNOSIS — K219 Gastro-esophageal reflux disease without esophagitis: Secondary | ICD-10-CM | POA: Insufficient documentation

## 2017-04-18 DIAGNOSIS — M419 Scoliosis, unspecified: Secondary | ICD-10-CM | POA: Insufficient documentation

## 2017-04-18 DIAGNOSIS — Z419 Encounter for procedure for purposes other than remedying health state, unspecified: Secondary | ICD-10-CM

## 2017-04-18 DIAGNOSIS — Z79899 Other long term (current) drug therapy: Secondary | ICD-10-CM | POA: Insufficient documentation

## 2017-04-18 DIAGNOSIS — Z91048 Other nonmedicinal substance allergy status: Secondary | ICD-10-CM | POA: Insufficient documentation

## 2017-04-18 DIAGNOSIS — Z886 Allergy status to analgesic agent status: Secondary | ICD-10-CM | POA: Insufficient documentation

## 2017-04-18 DIAGNOSIS — G4733 Obstructive sleep apnea (adult) (pediatric): Secondary | ICD-10-CM | POA: Insufficient documentation

## 2017-04-18 DIAGNOSIS — Z7984 Long term (current) use of oral hypoglycemic drugs: Secondary | ICD-10-CM | POA: Insufficient documentation

## 2017-04-18 DIAGNOSIS — Z823 Family history of stroke: Secondary | ICD-10-CM | POA: Insufficient documentation

## 2017-04-18 DIAGNOSIS — M1990 Unspecified osteoarthritis, unspecified site: Secondary | ICD-10-CM | POA: Diagnosis not present

## 2017-04-18 HISTORY — PX: SPINAL CORD STIMULATOR INSERTION: SHX5378

## 2017-04-18 HISTORY — DX: Type 2 diabetes mellitus without complications: E11.9

## 2017-04-18 LAB — GLUCOSE, CAPILLARY
Glucose-Capillary: 86 mg/dL (ref 65–99)
Glucose-Capillary: 87 mg/dL (ref 65–99)
Glucose-Capillary: 108 mg/dL — ABNORMAL HIGH (ref 65–99)

## 2017-04-18 LAB — PROTIME-INR
INR: 1.03
Prothrombin Time: 13.4 seconds (ref 11.4–15.2)

## 2017-04-18 LAB — APTT: aPTT: 33 seconds (ref 24–36)

## 2017-04-18 SURGERY — INSERTION, SPINAL CORD STIMULATOR, LUMBAR
Anesthesia: Monitor Anesthesia Care

## 2017-04-18 MED ORDER — LACTATED RINGERS IV SOLN
INTRAVENOUS | Status: DC
  Administered 2017-04-18 (×2): via INTRAVENOUS

## 2017-04-18 MED ORDER — CEFAZOLIN SODIUM-DEXTROSE 2-4 GM/100ML-% IV SOLN
2.0000 g | INTRAVENOUS | Status: AC
  Administered 2017-04-18: 2 g via INTRAVENOUS

## 2017-04-18 MED ORDER — SUGAMMADEX SODIUM 200 MG/2ML IV SOLN
INTRAVENOUS | Status: AC
  Filled 2017-04-18: qty 2

## 2017-04-18 MED ORDER — BUPIVACAINE-EPINEPHRINE (PF) 0.5% -1:200000 IJ SOLN
INTRAMUSCULAR | Status: DC | PRN
  Administered 2017-04-18: 27 mL

## 2017-04-18 MED ORDER — ONDANSETRON HCL 4 MG/2ML IJ SOLN
INTRAMUSCULAR | Status: DC | PRN
  Administered 2017-04-18: 4 mg via INTRAVENOUS

## 2017-04-18 MED ORDER — SUCCINYLCHOLINE CHLORIDE 200 MG/10ML IV SOSY
PREFILLED_SYRINGE | INTRAVENOUS | Status: AC
  Filled 2017-04-18: qty 10

## 2017-04-18 MED ORDER — PHENYLEPHRINE 40 MCG/ML (10ML) SYRINGE FOR IV PUSH (FOR BLOOD PRESSURE SUPPORT)
PREFILLED_SYRINGE | INTRAVENOUS | Status: AC
  Filled 2017-04-18: qty 10

## 2017-04-18 MED ORDER — FENTANYL CITRATE (PF) 100 MCG/2ML IJ SOLN
INTRAMUSCULAR | Status: DC | PRN
  Administered 2017-04-18 (×5): 50 ug via INTRAVENOUS

## 2017-04-18 MED ORDER — DEXAMETHASONE SODIUM PHOSPHATE 10 MG/ML IJ SOLN
INTRAMUSCULAR | Status: AC
  Filled 2017-04-18: qty 1

## 2017-04-18 MED ORDER — CLINDAMYCIN HCL 150 MG PO CAPS: 150.0000 mg | ORAL_CAPSULE | Freq: Three times a day (TID) | ORAL | 0 refills | Status: AC

## 2017-04-18 MED ORDER — MIDAZOLAM HCL 2 MG/2ML IJ SOLN
INTRAMUSCULAR | Status: AC
  Filled 2017-04-18: qty 2

## 2017-04-18 MED ORDER — CHLORHEXIDINE GLUCONATE CLOTH 2 % EX PADS: 6.0000 | MEDICATED_PAD | Freq: Once | CUTANEOUS | Status: DC

## 2017-04-18 MED ORDER — OXYCODONE HCL 5 MG PO TABS: 5.0000 mg | ORAL_TABLET | Freq: Once | ORAL | Status: DC | PRN

## 2017-04-18 MED ORDER — PHENYLEPHRINE 40 MCG/ML (10ML) SYRINGE FOR IV PUSH (FOR BLOOD PRESSURE SUPPORT)
PREFILLED_SYRINGE | INTRAVENOUS | Status: DC | PRN
  Administered 2017-04-18: 80 ug via INTRAVENOUS

## 2017-04-18 MED ORDER — EPHEDRINE 5 MG/ML INJ
INTRAVENOUS | Status: AC
  Filled 2017-04-18: qty 10

## 2017-04-18 MED ORDER — FENTANYL CITRATE (PF) 100 MCG/2ML IJ SOLN
25.0000 ug | INTRAMUSCULAR | Status: DC | PRN
  Administered 2017-04-18 (×2): 25 ug via INTRAVENOUS

## 2017-04-18 MED ORDER — CEFAZOLIN SODIUM-DEXTROSE 2-4 GM/100ML-% IV SOLN
INTRAVENOUS | Status: AC
  Filled 2017-04-18: qty 100

## 2017-04-18 MED ORDER — ONDANSETRON HCL 4 MG/2ML IJ SOLN
INTRAMUSCULAR | Status: AC
  Filled 2017-04-18: qty 2

## 2017-04-18 MED ORDER — OXYCODONE HCL 5 MG/5ML PO SOLN: 5.0000 mg | Freq: Once | ORAL | Status: DC | PRN

## 2017-04-18 MED ORDER — BUPIVACAINE-EPINEPHRINE (PF) 0.5% -1:200000 IJ SOLN
INTRAMUSCULAR | Status: AC
  Filled 2017-04-18: qty 30

## 2017-04-18 MED ORDER — LIDOCAINE HCL (CARDIAC) 20 MG/ML IV SOLN
INTRAVENOUS | Status: DC | PRN
  Administered 2017-04-18: 60 mg via INTRAVENOUS

## 2017-04-18 MED ORDER — PROPOFOL 10 MG/ML IV BOLUS
INTRAVENOUS | Status: DC | PRN
  Administered 2017-04-18: 180 mg via INTRAVENOUS

## 2017-04-18 MED ORDER — SUGAMMADEX SODIUM 200 MG/2ML IV SOLN
INTRAVENOUS | Status: DC | PRN
  Administered 2017-04-18: 190.6 mg via INTRAVENOUS

## 2017-04-18 MED ORDER — OXYCODONE HCL 10 MG PO TABS: 10.0000 mg | ORAL_TABLET | ORAL | 0 refills | Status: DC | PRN

## 2017-04-18 MED ORDER — DEXAMETHASONE SODIUM PHOSPHATE 10 MG/ML IJ SOLN
INTRAMUSCULAR | Status: DC | PRN
  Administered 2017-04-18: 10 mg via INTRAVENOUS

## 2017-04-18 MED ORDER — BACITRACIN ZINC 500 UNIT/GM EX OINT
TOPICAL_OINTMENT | CUTANEOUS | Status: AC
  Filled 2017-04-18: qty 28.35

## 2017-04-18 MED ORDER — MIDAZOLAM HCL 5 MG/5ML IJ SOLN
INTRAMUSCULAR | Status: DC | PRN
  Administered 2017-04-18: 2 mg via INTRAVENOUS

## 2017-04-18 MED ORDER — ROCURONIUM BROMIDE 100 MG/10ML IV SOLN
INTRAVENOUS | Status: DC | PRN
  Administered 2017-04-18: 20 mg via INTRAVENOUS
  Administered 2017-04-18: 50 mg via INTRAVENOUS

## 2017-04-18 MED ORDER — OXYCODONE HCL 5 MG PO TABS
ORAL_TABLET | ORAL | Status: AC
  Filled 2017-04-18: qty 2

## 2017-04-18 MED ORDER — CEFAZOLIN SODIUM 1 G IJ SOLR
INTRAMUSCULAR | Status: AC
  Filled 2017-04-18: qty 10

## 2017-04-18 MED ORDER — FENTANYL CITRATE (PF) 250 MCG/5ML IJ SOLN
INTRAMUSCULAR | Status: AC
  Filled 2017-04-18: qty 5

## 2017-04-18 MED ORDER — 0.9 % SODIUM CHLORIDE (POUR BTL) OPTIME
TOPICAL | Status: DC | PRN
  Administered 2017-04-18: 1000 mL

## 2017-04-18 MED ORDER — PROMETHAZINE HCL 25 MG/ML IJ SOLN
6.2500 mg | INTRAMUSCULAR | Status: DC | PRN
Start: 2017-04-18 — End: 2017-04-18

## 2017-04-18 MED ORDER — OXYCODONE HCL 5 MG PO TABS
10.0000 mg | ORAL_TABLET | Freq: Once | ORAL | Status: AC
  Administered 2017-04-18: 10 mg via ORAL

## 2017-04-18 MED ORDER — FENTANYL CITRATE (PF) 100 MCG/2ML IJ SOLN
INTRAMUSCULAR | Status: AC
  Filled 2017-04-18: qty 2

## 2017-04-18 MED ORDER — PROPOFOL 10 MG/ML IV BOLUS
INTRAVENOUS | Status: AC
  Filled 2017-04-18: qty 40

## 2017-04-18 MED ORDER — SODIUM CHLORIDE 0.9 % IR SOLN
Status: DC | PRN
  Administered 2017-04-18: 14:00:00

## 2017-04-18 MED ORDER — ROCURONIUM BROMIDE 10 MG/ML (PF) SYRINGE
PREFILLED_SYRINGE | INTRAVENOUS | Status: AC
  Filled 2017-04-18: qty 5

## 2017-04-18 SURGICAL SUPPLY — 60 items
ANCHOR CLIK X NEURO (Stimulator) ×2 IMPLANT
BAG DECANTER FOR FLEXI CONT (MISCELLANEOUS) ×2 IMPLANT
BENZOIN TINCTURE PRP APPL 2/3 (GAUZE/BANDAGES/DRESSINGS) IMPLANT
BINDER ABDOMINAL 12 ML 46-62 (SOFTGOODS) ×2 IMPLANT
CHLORAPREP W/TINT 26ML (MISCELLANEOUS) ×2 IMPLANT
DERMABOND ADVANCED (GAUZE/BANDAGES/DRESSINGS) ×1
DERMABOND ADVANCED .7 DNX12 (GAUZE/BANDAGES/DRESSINGS) ×1 IMPLANT
DRAPE C-ARM 42X72 X-RAY (DRAPES) ×2 IMPLANT
DRAPE C-ARMOR (DRAPES) ×2 IMPLANT
DRAPE LAPAROTOMY 100X72X124 (DRAPES) ×2 IMPLANT
DRAPE POUCH INSTRU U-SHP 10X18 (DRAPES) ×2 IMPLANT
DRAPE SURG 17X23 STRL (DRAPES) ×2 IMPLANT
DRSG OPSITE POSTOP 3X4 (GAUZE/BANDAGES/DRESSINGS) ×2 IMPLANT
DRSG OPSITE POSTOP 4X6 (GAUZE/BANDAGES/DRESSINGS) ×2 IMPLANT
ELECT REM PT RETURN 9FT ADLT (ELECTROSURGICAL) ×2
ELECTRODE REM PT RTRN 9FT ADLT (ELECTROSURGICAL) ×1 IMPLANT
GAUZE SPONGE 4X4 16PLY XRAY LF (GAUZE/BANDAGES/DRESSINGS) ×2 IMPLANT
GENERATOR NEUROSTIMULATOR (Neurostimulator) ×2 IMPLANT
GLOVE BIOGEL PI IND STRL 6.5 (GLOVE) ×1 IMPLANT
GLOVE BIOGEL PI IND STRL 7.0 (GLOVE) ×2 IMPLANT
GLOVE BIOGEL PI IND STRL 7.5 (GLOVE) ×3 IMPLANT
GLOVE BIOGEL PI IND STRL 8 (GLOVE) ×2 IMPLANT
GLOVE BIOGEL PI INDICATOR 6.5 (GLOVE) ×1
GLOVE BIOGEL PI INDICATOR 7.0 (GLOVE) ×2
GLOVE BIOGEL PI INDICATOR 7.5 (GLOVE) ×3
GLOVE BIOGEL PI INDICATOR 8 (GLOVE) ×2
GLOVE ECLIPSE 7.5 STRL STRAW (GLOVE) ×4 IMPLANT
GLOVE SURG SS PI 6.0 STRL IVOR (GLOVE) ×4 IMPLANT
GOWN STRL REUS W/ TWL LRG LVL3 (GOWN DISPOSABLE) ×2 IMPLANT
GOWN STRL REUS W/ TWL XL LVL3 (GOWN DISPOSABLE) IMPLANT
GOWN STRL REUS W/TWL 2XL LVL3 (GOWN DISPOSABLE) ×4 IMPLANT
GOWN STRL REUS W/TWL LRG LVL3 (GOWN DISPOSABLE) ×2
GOWN STRL REUS W/TWL XL LVL3 (GOWN DISPOSABLE)
KIT BASIN OR (CUSTOM PROCEDURE TRAY) ×2 IMPLANT
KIT CHARGING (KITS) ×1
KIT CHARGING PRECISION NEURO (KITS) ×1 IMPLANT
KIT REMOTE CONTROL 112802 FREE (KITS) ×2 IMPLANT
KIT TURNOVER KIT B (KITS) ×2 IMPLANT
LEAD INFINION CX PERC 70CM (Lead) ×4 IMPLANT
NEEDLE ENTRADA 4.5IN (NEEDLE) ×4 IMPLANT
NEEDLE HYPO 25X1 1.5 SAFETY (NEEDLE) ×2 IMPLANT
NS IRRIG 1000ML POUR BTL (IV SOLUTION) ×2 IMPLANT
PACK LAMINECTOMY NEURO (CUSTOM PROCEDURE TRAY) ×2 IMPLANT
PAD ARMBOARD 7.5X6 YLW CONV (MISCELLANEOUS) ×2 IMPLANT
SPONGE LAP 4X18 X RAY DECT (DISPOSABLE) ×2 IMPLANT
STAPLER SKIN PROX WIDE 3.9 (STAPLE) ×2 IMPLANT
STRIP CLOSURE SKIN 1/2X4 (GAUZE/BANDAGES/DRESSINGS) IMPLANT
SUT MNCRL AB 4-0 PS2 18 (SUTURE) IMPLANT
SUT SILK 0 (SUTURE) ×1
SUT SILK 0 MO-6 18XCR BRD 8 (SUTURE) ×1 IMPLANT
SUT SILK 0 TIES 10X30 (SUTURE) IMPLANT
SUT SILK 2 0 TIES 10X30 (SUTURE) IMPLANT
SUT VIC AB 2-0 CP2 18 (SUTURE) ×4 IMPLANT
SYR 10ML LL (SYRINGE) IMPLANT
SYR EPIDURAL 5ML GLASS (SYRINGE) ×2 IMPLANT
TOOL LONG TUNNEL (SPINAL CORD STIMULATOR) ×2 IMPLANT
TOWEL GREEN STERILE (TOWEL DISPOSABLE) ×2 IMPLANT
TOWEL GREEN STERILE FF (TOWEL DISPOSABLE) ×2 IMPLANT
WATER STERILE IRR 1000ML POUR (IV SOLUTION) ×2 IMPLANT
YANKAUER SUCT BULB TIP NO VENT (SUCTIONS) ×2 IMPLANT

## 2017-04-18 NOTE — Op Note (Signed)
PREOP DX: 1) chronic pain syndrome 2)lumbago with radiculopathy POSTOP UJ:[id number] as preop PROCEDURES PERFORMED:1) intraop fluoro 2) placement of 2 16 contact [employer] Infinion leads 3) placement of Spectra SCS generator 4) post op complex SCS programming SURGEON:Wilmer Berryhill  ASSISTANT: NONE  ANESTHESIA:GETA EBL: <20cc  DESCRIPTION OF PROCEDURE: After a discussion of risks, benefits and alternatives, informed consent was obtained. The patient was taken to the OR,general anesthesia induced,turned prone onto a Jackson table, all pressure points padded, SCD's placed. A timeout was taken to verify the correct patient, position, personnel, availability of appropriate equipment, and administration of perioperative antibiotics.  The thoracic and lumbar areas were widely prepped with chloraprep and draped into a sterile field. Fluoroscopy was used to plan arightparamedian incision at theT12-L2 levels, and an incision made with a 10 blade and carried down to the dorsolumbar fascia with the bovie and blunt dissection. Retractors were placed and a 14g [employer] tuohy needle placed into the epidural space at the T12-L1interspace using biplanar fluoro and loss-of-resistance technique. The needle was aspirated without any return of fluid. A [employer] INFINION lead was introduced and under live AP fluoro advanced until the distal-most2contactsoverlay thesuperioraspectof theT7vertebral body shadow with the rest of the contacts distributed over the T7, T8and T9vertebral bodies in a position just right of anatomic midline. A second Infinion lead was placed just left of anatomic midline in the same levels using the same technique. 0 silk sutures were placed in the fascia adjacent to the needles. The needles and stylets were removed under fluoroscopy with no lead migration noted. Leads were then fixed to the fascia byClik anchorswith the sutures; repeat images were obtained to  verify that there had been no lead migration. The incision was inspected and hemostasis obtained with the bipolar cautery.    Attention was then turned to creation of a subcutaneous pocket. At therightflank, a 3 cm incision was made with a 10 blade and using the bovie and blunt dissection a pocket of size appropriate to place a SCS generator. The pocket was trialed, and found to be of adequate size. The pocket was inspected for hemostasis, which was found to be excellent. Using reverse seldinger technique, the leads were tunneled to the pocket site, and the leads inserted into the SCS generator. Impedances were checked, and all found to be excellent. The leads were then all fixed into position with a self-torquing wrench. The wiring was all carefully coiled, placed behind the generator and placed in the pocket.  Both incisions were copiously irrigated with bacitracin-containing irrigation. The lumbar incision was closed in 2 deep layers of interrupted 2-0 vicryl and the skin closed with staples. The pocket incision was closed with a deeper layer of 2-0 vicryl interrupted sutures, and the skin closed with staples. Sterile dressings were applied. Needle, sponge, and instrument counts were correct x2 at the end of the case.  The patient was then carefully awakened from anesthesia, turned supine, an abdominal binder placed, and the patient taken to the recovery room where he underwent complex spinal cord stimulator programming.  COMPLICATIONS: NONE  CONDITION: Stable throughout the course of the procedure and immediately afterward  DISPOSITION: discharge to home, with antibiotics and pain medicine. Discussed care with the patientand family. Followup in clinic will be scheduled in 10-14 days.

## 2017-04-18 NOTE — Transfer of Care (Signed)
Immediate Anesthesia Transfer of Care Note  Patient: Debbie Pineda  Procedure(s) Performed: LUMBAR SPINAL CORD STIMULATOR INSERTION (N/A )  Patient Location: PACU  Anesthesia Type:General  Level of Consciousness: awake, alert , oriented and patient cooperative  Airway & Oxygen Therapy: Patient Spontanous Breathing and Patient connected to nasal cannula oxygen  Post-op Assessment: Report given to RN, Post -op Vital signs reviewed and stable and Patient moving all extremities X 4  Post vital signs: Reviewed and stable  Last Vitals:  Vitals Value Taken Time  BP 101/56 04/18/2017  2:19 PM  Temp 36.6 C 04/18/2017  2:15 PM  Pulse 64 04/18/2017  2:22 PM  Resp 17 04/18/2017  2:22 PM  SpO2 84 % 04/18/2017  2:22 PM  Vitals shown include unvalidated device data.  Last Pain:  Vitals:   04/18/17 1007  TempSrc:   PainSc: 0-No pain         Complications: No apparent anesthesia complications

## 2017-04-18 NOTE — Anesthesia Procedure Notes (Signed)
Procedure Name: Intubation Date/Time: 04/18/2017 12:52 PM Performed by: Lowella Dell, CRNA Pre-anesthesia Checklist: Patient identified, Emergency Drugs available, Suction available and Patient being monitored Patient Re-evaluated:Patient Re-evaluated prior to induction Oxygen Delivery Method: Circle System Utilized Preoxygenation: Pre-oxygenation with 100% oxygen Induction Type: IV induction Ventilation: Mask ventilation without difficulty Laryngoscope Size: Mac and 3 Grade View: Grade I Tube type: Oral Number of attempts: 1 Airway Equipment and Method: Stylet Placement Confirmation: ETT inserted through vocal cords under direct vision,  positive ETCO2 and breath sounds checked- equal and bilateral Secured at: 22 cm Tube secured with: Tape Dental Injury: Teeth and Oropharynx as per pre-operative assessment

## 2017-04-18 NOTE — Anesthesia Postprocedure Evaluation (Signed)
Anesthesia Post Note  Patient: Debbie Pineda  Procedure(s) Performed: LUMBAR SPINAL CORD STIMULATOR INSERTION (N/A )     Patient location during evaluation: PACU Anesthesia Type: General Level of consciousness: awake and alert Pain management: pain level controlled Vital Signs Assessment: post-procedure vital signs reviewed and stable Respiratory status: spontaneous breathing, nonlabored ventilation and respiratory function stable Cardiovascular status: stable and blood pressure returned to baseline Anesthetic complications: no    Last Vitals:  Vitals:   04/18/17 1439 04/18/17 1451  BP:  110/62  Pulse: 62 (!) 55  Resp: 19 15  Temp:    SpO2: 94% (!) 86%    Last Pain:  Vitals:   04/18/17 1415  TempSrc:   PainSc: 0-No pain                 Audry Pili

## 2017-04-18 NOTE — Anesthesia Preprocedure Evaluation (Addendum)
Anesthesia Evaluation  Patient identified by MRN, date of birth, ID band Patient awake    Reviewed: Allergy & Precautions, NPO status , Patient's Chart, lab work & pertinent test results  Airway Mallampati: II  TM Distance: >3 FB Neck ROM: Full    Dental  (+) Dental Advisory Given, Teeth Intact   Pulmonary sleep apnea and Continuous Positive Airway Pressure Ventilation , former smoker,    Pulmonary exam normal breath sounds clear to auscultation       Cardiovascular + DVT  Normal cardiovascular exam Rhythm:Regular Rate:Normal     Neuro/Psych Depression Memory deficits, right hemiparesis and visual field deficits, insomnia CVA, Residual Symptoms    GI/Hepatic Neg liver ROS, GERD  Medicated and Controlled,S/p roux-en-y bypass   Endo/Other  diabetes, Type 2, Oral Hypoglycemic AgentsHypothyroidism Obesity  Renal/GU negative Renal ROS     Musculoskeletal  (+) Arthritis , Osteoarthritis,  Fibromyalgia -Chronic back pain, scoliosis   Abdominal (+) + obese,   Peds  (+) ATTENTION DEFICIT DISORDER WITHOUT HYPERACTIVITY Hematology  (+) anemia ,   Anesthesia Other Findings   Reproductive/Obstetrics                            Anesthesia Physical  Anesthesia Plan  ASA: III  Anesthesia Plan: General   Post-op Pain Management:    Induction: Intravenous  PONV Risk Score and Plan: Midazolam, Treatment may vary due to age or medical condition, Ondansetron and Dexamethasone  Airway Management Planned: Oral ETT  Additional Equipment: None  Intra-op Plan:   Post-operative Plan: Extubation in OR  Informed Consent: I have reviewed the patients History and Physical, chart, labs and discussed the procedure including the risks, benefits and alternatives for the proposed anesthesia with the patient or authorized representative who has indicated his/her understanding and acceptance.   Dental advisory  given  Plan Discussed with: CRNA, Anesthesiologist and Surgeon  Anesthesia Plan Comments: (Will discuss with surgeon regarding MAC vs. GA)       Anesthesia Quick Evaluation

## 2017-04-18 NOTE — H&P (Signed)
Debbie Pineda is an 57 y.o. female.   Chief Complaint: Back pain with radiation into the legs worse on the right than the left HPI: 57 year old right-handed woman, with a past medical history that includes stroke, fibromyalgia, but also with history of scoliosis, and progressive back pain, not considered to be a surgical candidate, comes to me referred by Dr. Ernestina Pineda for consideration of permanent spinal cord stimulator implant.  Patient has been through surgical evaluation and not been found to be a surgical candidate for her back pain.  She has gone through a variety of other conservative measures including intervention, and medication management without substantial benefit.  She then considered spinal cord stimulator therapy, was found to be an adequate candidate from a psychological perspective, and recently underwent spinal cord stimulator trial with Dr. Ernestina Pineda.  She returned to his clinic reporting better than 50% pain relief in her back pain, and radiating symptoms into her thorax, as well as radiating symptoms down her legs, which is right worse than left.  She had no complications, and is interested in proceeding with permanent implantation   Past Medical History:  Diagnosis Date  . ADD (attention deficit disorder)    takes Adderall daily  . Arthritis    "all over"  . Cerebral infarction ([place]) 10/30/2011  . Cerebrovascular disease 08/14/2016  . Chronic back pain    "all over"  . Chronic low back pain 01/11/2016  . Complication of anesthesia    tends to have hypotension when NPO and post-anesthesia  . Constipation    takes stool softener daily  . Degenerative disk disease   . Degenerative joint disease   . Depression    takes Cymbalta daily for pain per pt  . DVT (deep venous thrombosis) ([facility])    RLE  . Family history of adverse reaction to anesthesia    a family member woke up during surgery; "think it was my mom"  . Fibromyalgia   . Generalized osteoarthritis of multiple  sites 11/04/2013  . GERD (gastroesophageal reflux disease)   . Heart palpitations 12/14/2013  . History of blood clots    superficial  . Hypoglycemia   . Hypothyroid    takes Synthroid daily  . Incomplete emptying of bladder   . Insomnia    takes Trazodone nightly  . Iron deficiency anemia    takes Ferrous Sulfate daily  . Joint pain   . Joint swelling    knees and ankles  . Memory disorder 08/14/2016  . Morbid obesity ([place])   . Nausea    takes Zofran as needed.Seeing GI doc  . Neck pain 05/25/2016  . OSA on CPAP    tested more than 5 yrs ago.    . Osteoarthritis   . PFO (patent foramen ovale)   . Primary osteoarthritis of both feet 05/29/2016   Right bunionectomy August 2017 by Dr. Sharol Given  . Scoliosis   . Sleep apnea   . Stroke [facility]) "several"   right foot weakness; memory issues, black spot right visual field since" (03/23/2015)  . Thrombophlebitis   . Trochanteric bursitis of both hips 05/25/2016  . Unilateral primary osteoarthritis, right knee 05/29/2016  . Urinary urgency 04/19/2016  . Vein disorder 11/29/2010    Past Surgical History:  Procedure Laterality Date  . BUNIONECTOMY Right 08/2015  . CARDIAC CATHETERIZATION     2008.  "it was fine" (not sure why she had it done, and doesn't know where)  . COLONOSCOPY N/A 03/25/2013   Procedure: COLONOSCOPY;  Surgeon: Debbie Houston, MD;  Location: [facility];  Service: Endoscopy;  Laterality: N/A;  930  . ESOPHAGOGASTRODUODENOSCOPY    . EXPLORATORY LAPAROTOMY     "took fallopian tubes out"  . JOINT REPLACEMENT     bil knee   . KNEE ARTHROPLASTY    . KNEE ARTHROSCOPY Left   . KNEE ARTHROSCOPY W/ ACL RECONSTRUCTION Right    "added pins"  . LAPAROSCOPIC CHOLECYSTECTOMY  ~ 2001  . 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 TOTAL KNEE ARTHROPLASTY Left 03/23/2015   Procedure: TOTAL KNEE ARTHROPLASTY;  Surgeon: Debbie Minion, MD;  Location: [facility];  Service: Orthopedics;  Laterality: Left;  . TOTAL KNEE ARTHROPLASTY Right 08/15/2016    Procedure: RIGHT TOTAL KNEE ARTHROPLASTY, REMOVAL ACL SCREWS;  Surgeon: Debbie Minion, MD;  Location: [place];  Service: Orthopedics;  Laterality: Right;  . TOTAL KNEE ARTHROPLASTY WITH HARDWARE REMOVAL Right   . VAGINAL HYSTERECTOMY    . VARICOSE VEIN SURGERY Right X 2    Family History  Problem Relation Age of Onset  . Heart disease Father   . Cancer Father   . Cancer Mother        skin cancer   . Heart disease Brother   . Cancer Brother   . Diabetes Brother   . Stroke Brother   . Heart disease Sister   . Cancer Maternal Grandfather   . Hypothyroidism Daughter   . Colon cancer Neg Hx    Social History:  reports that she quit smoking about 26 years ago. Her smoking use included cigarettes. She has a 6.00 pack-year smoking history. She has never used smokeless tobacco. She reports that she does not drink alcohol or use drugs.  Allergies:  Allergies  Allergen Reactions  . Lyrica [Pregabalin] Other (See Comments)    SOB, lower extremity edema and weight gain  . Flexeril [Cyclobenzaprine Hcl] Other (See Comments)    Numbness of extremities  . Morphine And Related Itching    Upper torso  . Sulfamethoxazole-Trimethoprim Itching and Rash    Bactrim  . Belsomra [Suvorexant] Other (See Comments)    unknown  . Tape Itching and Rash    Please use "paper" tape Rash if left on longer than 24 hrs    Facility-Administered Medications Prior to Admission  Medication Dose Route Frequency Provider Last Rate Last Dose  . lidocaine (PF) (XYLOCAINE) 1 % injection 10 mL  10 mL Other Once Magnus Sinning, MD       Medications Prior to Admission  Medication Sig Dispense Refill  . amoxicillin (AMOXIL) 250 MG capsule Take 250 mg by mouth 3 (three) times daily.    Marland Kitchen aspirin EC 81 MG tablet Take 81 mg by mouth daily. Take from 3/25-3/31    . bacitracin-polymyxin b (POLYSPORIN) ointment Apply 1 application topically 3 (three) times daily as needed (burns).    . baclofen (LIORESAL) 10 MG tablet  Take 10 mg by mouth 3 (three) times daily as needed for muscle spasms.     . Cyanocobalamin (B-12) 5000 MCG SUBL Place 5,000 mcg under the tongue daily at 12 noon.    . diclofenac sodium (VOLTAREN) 1 % GEL Apply 4 g topically 4 (four) times daily as needed (PAIN).     Marland Kitchen donepezil (ARICEPT) 10 MG tablet Take 1 tablet (10 mg total) by mouth at bedtime. 30 tablet 3  . DULoxetine (CYMBALTA) 60 MG capsule Take 60 mg by mouth 2 (two) times daily.  11  . ferrous  sulfate 325 (65 FE) MG tablet Take 325 mg by mouth daily at 12 noon.    . fluconazole (DIFLUCAN) 150 MG tablet Take 150 mg by mouth daily as needed (thrush). Take for 7 days    . folic acid (FOLVITE) 102 MCG tablet Take 400 mcg by mouth daily.    Marland Kitchen gabapentin (NEURONTIN) 600 MG tablet Take 600 mg by mouth 3 (three) times daily.    . hydrOXYzine (ATARAX/VISTARIL) 10 MG tablet Take 1 tablet (10 mg total) by mouth 3 (three) times daily as needed. (Patient taking differently: Take 10 mg by mouth 3 (three) times daily as needed for itching. ) 30 tablet 0  . INTRAROSA 6.5 MG INST Place 1 suppository vaginally at bedtime.     Marland Kitchen Ketoconazole-Hydrocortisone 2 & 1 % KIT Apply 1 application topically 2 (two) times daily as needed. Apply to affected creases    . Krill Oil 300 MG CAPS Take 300 mg by mouth daily.    Marland Kitchen L-Methylfolate-B12-B6-B2 (CEREFOLIN) 06-22-48-5 MG TABS Take 1 tablet by mouth daily. 90 each 3  . levothyroxine (SYNTHROID, LEVOTHROID) 125 MCG tablet Take 125 mcg by mouth daily before breakfast.    . Menthol, Topical Analgesic, (GOLD BOND FOOT SPRAY MAX ST EX) Apply 1 application topically daily as needed (foot irritation).    . metFORMIN (GLUCOPHAGE) 500 MG tablet Take 500 mg by mouth daily with breakfast.    . methocarbamol (ROBAXIN) 750 MG tablet Take 1 tablet by mouth 3 times daily. 90 tablet 0  . methylphenidate (RITALIN) 20 MG tablet Take 10 mg by mouth 5 (five) times daily. Between 0800 and 1600    . mirabegron ER (MYRBETRIQ) 50 MG TB24  tablet Take 50 mg by mouth daily.    . Multiple Minerals-Vitamins (CAL-MAG-ZINC-D PO) Take 1-2 tablets by mouth See admin instructions. Take 2 tablet at 1200 and 1 tablet at 1600  Calcium 1000 mg Vit D 600 iu Magnesium 400 mg Zinc 15 mg    . Multiple Vitamin (MULTIVITAMIN WITH MINERALS) TABS tablet Take 1 tablet by mouth 2 (two) times daily. One a day petites    . mupirocin ointment (BACTROBAN) 2 % Apply 1 application topically 3 (three) times daily as needed.    Marland Kitchen NARCAN 4 MG/0.1ML LIQD nasal spray kit Place 1 spray into the nose as needed (over dose).     Marland Kitchen omeprazole (PRILOSEC) 40 MG capsule Take 1 capsule by mouth twice daily. 60 capsule 4  . ondansetron (ZOFRAN-ODT) 4 MG disintegrating tablet Take 4 mg by mouth every 6 (six) hours as needed for nausea.   2  . Oxycodone HCl 10 MG TABS Take 10 mg by mouth every 4 to 6 hours as needed for pain  0  . Polyvinyl Alcohol-Povidone (REFRESH OP) Place 1-2 drops into both eyes 3 (three) times daily as needed (dry eyes).    . pseudoephedrine (SUDAFED) 30 MG tablet Take 30-60 mg by mouth every 4 (four) hours as needed for congestion.    . rivaroxaban (XARELTO) 20 MG TABS tablet Take 1 tablet (20 mg total) by mouth daily with supper. (Patient taking differently: Take 20 mg by mouth at bedtime. ) 30 tablet 1  . SUMAtriptan (IMITREX) 50 MG tablet Take 1 tablet (50 mg total) by mouth every 2 (two) hours as needed for migraine (ongoing headache). Maximum daily dose - 2 tablets 15 tablet 0  . topiramate (TOPAMAX) 25 MG tablet Take 25 mg by mouth 2 (two) times daily.    Marland Kitchen  traMADol (ULTRAM) 50 MG tablet Take 50 mg by mouth 4 (four) times daily as needed (FOR BREAK THROUGH PAIN).     Marland Kitchen traZODone (DESYREL) 100 MG tablet Take 50-200 mg by mouth at bedtime as needed for sleep (depends on insomnia).     Marland Kitchen UNABLE TO FIND Apply 1 application topically 2 (two) times daily as needed. Med Name: C-Anti-Inflammatory Cream    . vitamin C (ASCORBIC ACID) 500 MG tablet Take  500 mg by mouth daily at 12 noon.     . Vitamin D, Ergocalciferol, (DRISDOL) 50000 UNITS CAPS Take 50,000 Units by mouth every 7 (seven) days. Mondays    . diazepam (VALIUM) 10 MG tablet Take 1 tablet (10 mg total) by mouth once as needed for up to 1 dose for anxiety. Please 1 hour prior to procedure 2 tablet 0  . Menthol, Topical Analgesic, (BIOFREEZE EX) Apply 1 application topically 3 (three) times daily as needed (muscle pain).    . Neomy-Bacit-Polymyx-Pramoxine (NEOSPORIN + PAIN RELIEF MAX ST) 1 % OINT Apply 1 application topically as needed (wound care).    . polyethylene glycol powder (MIRALAX) powder Take 17 g by mouth 2 (two) times daily as needed for moderate constipation.     Marland Kitchen UNABLE TO FIND Bilateral Knee high 20-30 mmHg compression stockings. 1 Package 0    No results found for this or any previous visit (from the past 48 hour(s)). Xr Pelvis 1-2 Views  Result Date: 04/16/2017 [facility] of the pelvis no degenerative changes of the hip bilaterally.  There is no calcification over the greater trochanter bursitis bursa.  Patient does have degenerative changes through the lower lumbar spine.   Review of Systems  Constitutional: Negative.   HENT: Negative.   Eyes: Negative.   Respiratory: Negative.   Cardiovascular: Negative.   Gastrointestinal: Negative.   Genitourinary: Negative.   Musculoskeletal: Positive for back pain. Negative for falls.  Skin: Negative.   Neurological: Negative.   Endo/Heme/Allergies: Negative.   Psychiatric/Behavioral: Positive for depression and memory loss. The patient does not have insomnia.     Blood pressure 125/68, pulse (!) 56, temperature 98.2 F (36.8 C), temperature source Oral, resp. rate 18, height '5\' 5"'  (1.651 m), weight 95.3 kg (210 lb), SpO2 99 %. Physical Exam  Constitutional: She is oriented to person, place, and time. She appears well-developed and well-nourished.  HENT:  Head: Normocephalic and atraumatic.  Eyes: Pupils are equal,  round, and reactive to light.  Neck: Normal range of motion.  Cardiovascular: Normal rate.  Respiratory: Effort normal.  GI: Soft.  Musculoskeletal: Normal range of motion.  Neurological: She is alert and oriented to person, place, and time.  Skin: Skin is warm and dry.  Psychiatric: She has a normal mood and affect. Her behavior is normal.     Assessment/Plan  1. Chronic pain syndrome 2. Scoliosis and degenerative change with associated back pain and radiculopathy  Plan:  SCS implantation, general anesthetic, [employer] system.  We will have to verify that the patient stops her anti coagulant; I have obtained appropriate clearance from her monitoring physician  Bonna Gains, MD 04/18/2017, 12:11 PM

## 2017-04-18 NOTE — Progress Notes (Signed)
Orthopedic Tech Progress Note Patient Details:  Debbie Pineda 1960/12/10 [id number]  Ortho Devices Type of Ortho Device: Abdominal binder Ortho Device/Splint Location: bedside Ortho Device/Splint Interventions: Ordered, Application   Post Interventions Patient Tolerated: Well Instructions Provided: Care of device   Braulio Bosch 04/18/2017, 4:33 PM

## 2017-04-18 NOTE — Progress Notes (Signed)
scs tech/rep at bedside adjustung levels /programming stimulator with pt---- pt wants stimulator ON

## 2017-04-19 ENCOUNTER — Encounter (HOSPITAL_COMMUNITY): Payer: Self-pay | Admitting: Anesthesiology

## 2017-04-24 DIAGNOSIS — G4733 Obstructive sleep apnea (adult) (pediatric): Secondary | ICD-10-CM | POA: Diagnosis not present

## 2017-04-30 ENCOUNTER — Other Ambulatory Visit: Payer: Self-pay | Admitting: *Deleted

## 2017-04-30 MED ORDER — DONEPEZIL HCL 10 MG PO TABS: 10.0000 mg | ORAL_TABLET | Freq: Every day | ORAL | 3 refills | Status: DC

## 2017-05-01 ENCOUNTER — Emergency Department (HOSPITAL_COMMUNITY): Payer: PPO

## 2017-05-01 ENCOUNTER — Observation Stay (HOSPITAL_COMMUNITY): Payer: PPO

## 2017-05-01 ENCOUNTER — Inpatient Hospital Stay (HOSPITAL_COMMUNITY)
Admission: EM | Admit: 2017-05-01 | Discharge: 2017-05-03 | DRG: 101 | Disposition: A | Discharge status: Home Health | Payer: PPO | Attending: Internal Medicine | Admitting: Internal Medicine

## 2017-05-01 ENCOUNTER — Encounter (HOSPITAL_COMMUNITY): Payer: Self-pay | Admitting: Emergency Medicine

## 2017-05-01 ENCOUNTER — Other Ambulatory Visit: Payer: Self-pay

## 2017-05-01 DIAGNOSIS — R4189 Other symptoms and signs involving cognitive functions and awareness: Secondary | ICD-10-CM | POA: Diagnosis present

## 2017-05-01 DIAGNOSIS — Z823 Family history of stroke: Secondary | ICD-10-CM

## 2017-05-01 DIAGNOSIS — R7401 Elevation of levels of liver transaminase levels: Secondary | ICD-10-CM

## 2017-05-01 DIAGNOSIS — Q211 Atrial septal defect: Secondary | ICD-10-CM

## 2017-05-01 DIAGNOSIS — I671 Cerebral aneurysm, nonruptured: Secondary | ICD-10-CM | POA: Diagnosis present

## 2017-05-01 DIAGNOSIS — I69998 Other sequelae following unspecified cerebrovascular disease: Secondary | ICD-10-CM | POA: Diagnosis not present

## 2017-05-01 DIAGNOSIS — Z7901 Long term (current) use of anticoagulants: Secondary | ICD-10-CM | POA: Diagnosis not present

## 2017-05-01 DIAGNOSIS — R569 Unspecified convulsions: Secondary | ICD-10-CM | POA: Diagnosis not present

## 2017-05-01 DIAGNOSIS — I6789 Other cerebrovascular disease: Secondary | ICD-10-CM | POA: Diagnosis not present

## 2017-05-01 DIAGNOSIS — Z833 Family history of diabetes mellitus: Secondary | ICD-10-CM

## 2017-05-01 DIAGNOSIS — Z87891 Personal history of nicotine dependence: Secondary | ICD-10-CM

## 2017-05-01 DIAGNOSIS — E11649 Type 2 diabetes mellitus with hypoglycemia without coma: Secondary | ICD-10-CM | POA: Diagnosis present

## 2017-05-01 DIAGNOSIS — T402X5A Adverse effect of other opioids, initial encounter: Secondary | ICD-10-CM | POA: Diagnosis present

## 2017-05-01 DIAGNOSIS — R4702 Dysphasia: Secondary | ICD-10-CM

## 2017-05-01 DIAGNOSIS — Z882 Allergy status to sulfonamides status: Secondary | ICD-10-CM

## 2017-05-01 DIAGNOSIS — Z6835 Body mass index (BMI) 35.0-35.9, adult: Secondary | ICD-10-CM | POA: Diagnosis not present

## 2017-05-01 DIAGNOSIS — Z8672 Personal history of thrombophlebitis: Secondary | ICD-10-CM

## 2017-05-01 DIAGNOSIS — I639 Cerebral infarction, unspecified: Secondary | ICD-10-CM | POA: Diagnosis not present

## 2017-05-01 DIAGNOSIS — D6851 Activated protein C resistance: Secondary | ICD-10-CM | POA: Diagnosis present

## 2017-05-01 DIAGNOSIS — G40209 Localization-related (focal) (partial) symptomatic epilepsy and epileptic syndromes with complex partial seizures, not intractable, without status epilepticus: Secondary | ICD-10-CM | POA: Diagnosis present

## 2017-05-01 DIAGNOSIS — F988 Other specified behavioral and emotional disorders with onset usually occurring in childhood and adolescence: Secondary | ICD-10-CM | POA: Diagnosis present

## 2017-05-01 DIAGNOSIS — T426X5A Adverse effect of other antiepileptic and sedative-hypnotic drugs, initial encounter: Secondary | ICD-10-CM | POA: Diagnosis present

## 2017-05-01 DIAGNOSIS — E039 Hypothyroidism, unspecified: Secondary | ICD-10-CM | POA: Diagnosis present

## 2017-05-01 DIAGNOSIS — R4789 Other speech disturbances: Secondary | ICD-10-CM | POA: Diagnosis not present

## 2017-05-01 DIAGNOSIS — G894 Chronic pain syndrome: Secondary | ICD-10-CM

## 2017-05-01 DIAGNOSIS — Z885 Allergy status to narcotic agent status: Secondary | ICD-10-CM | POA: Diagnosis not present

## 2017-05-01 DIAGNOSIS — R748 Abnormal levels of other serum enzymes: Secondary | ICD-10-CM | POA: Diagnosis present

## 2017-05-01 DIAGNOSIS — Z9884 Bariatric surgery status: Secondary | ICD-10-CM

## 2017-05-01 DIAGNOSIS — R410 Disorientation, unspecified: Secondary | ICD-10-CM

## 2017-05-01 DIAGNOSIS — H53461 Homonymous bilateral field defects, right side: Secondary | ICD-10-CM | POA: Diagnosis present

## 2017-05-01 DIAGNOSIS — K838 Other specified diseases of biliary tract: Secondary | ICD-10-CM | POA: Diagnosis not present

## 2017-05-01 DIAGNOSIS — R740 Nonspecific elevation of levels of transaminase and lactic acid dehydrogenase [LDH]: Secondary | ICD-10-CM | POA: Diagnosis present

## 2017-05-01 DIAGNOSIS — Z96653 Presence of artificial knee joint, bilateral: Secondary | ICD-10-CM | POA: Diagnosis present

## 2017-05-01 DIAGNOSIS — R4701 Aphasia: Secondary | ICD-10-CM | POA: Diagnosis present

## 2017-05-01 DIAGNOSIS — G459 Transient cerebral ischemic attack, unspecified: Secondary | ICD-10-CM

## 2017-05-01 DIAGNOSIS — Z7984 Long term (current) use of oral hypoglycemic drugs: Secondary | ICD-10-CM

## 2017-05-01 DIAGNOSIS — Z808 Family history of malignant neoplasm of other organs or systems: Secondary | ICD-10-CM

## 2017-05-01 DIAGNOSIS — E1151 Type 2 diabetes mellitus with diabetic peripheral angiopathy without gangrene: Secondary | ICD-10-CM | POA: Diagnosis present

## 2017-05-01 DIAGNOSIS — E669 Obesity, unspecified: Secondary | ICD-10-CM | POA: Diagnosis present

## 2017-05-01 DIAGNOSIS — R1010 Upper abdominal pain, unspecified: Secondary | ICD-10-CM | POA: Diagnosis not present

## 2017-05-01 DIAGNOSIS — Z7982 Long term (current) use of aspirin: Secondary | ICD-10-CM

## 2017-05-01 DIAGNOSIS — K219 Gastro-esophageal reflux disease without esophagitis: Secondary | ICD-10-CM | POA: Diagnosis present

## 2017-05-01 DIAGNOSIS — Z86718 Personal history of other venous thrombosis and embolism: Secondary | ICD-10-CM

## 2017-05-01 DIAGNOSIS — Z8249 Family history of ischemic heart disease and other diseases of the circulatory system: Secondary | ICD-10-CM

## 2017-05-01 DIAGNOSIS — R4182 Altered mental status, unspecified: Secondary | ICD-10-CM | POA: Diagnosis not present

## 2017-05-01 LAB — RAPID URINE DRUG SCREEN, HOSP PERFORMED
Amphetamines: NOT DETECTED
Barbiturates: NOT DETECTED
Benzodiazepines: NOT DETECTED
Cocaine: NOT DETECTED
Opiates: NOT DETECTED
Tetrahydrocannabinol: NOT DETECTED

## 2017-05-01 LAB — CBC
HCT: 40.2 % (ref 36.0–46.0)
Hemoglobin: 13 g/dL (ref 12.0–15.0)
MCH: 31.9 pg (ref 26.0–34.0)
MCHC: 32.3 g/dL (ref 30.0–36.0)
MCV: 98.8 fL (ref 78.0–100.0)
Platelets: 230 10*3/uL (ref 150–400)
RBC: 4.07 MIL/uL (ref 3.87–5.11)
RDW: 12.2 % (ref 11.5–15.5)
WBC: 8.5 10*3/uL (ref 4.0–10.5)

## 2017-05-01 LAB — DIFFERENTIAL
Basophils Absolute: 0.1 10*3/uL (ref 0.0–0.1)
Basophils Relative: 1 %
Eosinophils Absolute: 0.2 10*3/uL (ref 0.0–0.7)
Eosinophils Relative: 2 %
Lymphocytes Relative: 31 %
Lymphs Abs: 2.6 10*3/uL (ref 0.7–4.0)
Monocytes Absolute: 0.8 10*3/uL (ref 0.1–1.0)
Monocytes Relative: 9 %
Neutro Abs: 4.8 10*3/uL (ref 1.7–7.7)
Neutrophils Relative %: 57 %

## 2017-05-01 LAB — I-STAT CHEM 8, ED
BUN: 17 mg/dL (ref 6–20)
Calcium, Ion: 1.19 mmol/L (ref 1.15–1.40)
Chloride: 101 mmol/L (ref 101–111)
Creatinine, Ser: 1 mg/dL (ref 0.44–1.00)
Glucose, Bld: 60 mg/dL — ABNORMAL LOW (ref 65–99)
HCT: 38 % (ref 36.0–46.0)
Hemoglobin: 12.9 g/dL (ref 12.0–15.0)
Potassium: 3.6 mmol/L (ref 3.5–5.1)
Sodium: 140 mmol/L (ref 135–145)
TCO2: 27 mmol/L (ref 22–32)

## 2017-05-01 LAB — GLUCOSE, CAPILLARY
Glucose-Capillary: 236 mg/dL — ABNORMAL HIGH (ref 65–99)
Glucose-Capillary: 72 mg/dL (ref 65–99)

## 2017-05-01 LAB — URINALYSIS, ROUTINE W REFLEX MICROSCOPIC
Bacteria, UA: NONE SEEN
Bilirubin Urine: NEGATIVE
Glucose, UA: NEGATIVE mg/dL
Ketones, ur: NEGATIVE mg/dL
Leukocytes, UA: NEGATIVE
Nitrite: NEGATIVE
Protein, ur: NEGATIVE mg/dL
Specific Gravity, Urine: 1.011 (ref 1.005–1.030)
pH: 8 (ref 5.0–8.0)

## 2017-05-01 LAB — COMPREHENSIVE METABOLIC PANEL
ALT: 221 U/L — ABNORMAL HIGH (ref 14–54)
AST: 407 U/L — ABNORMAL HIGH (ref 15–41)
Albumin: 4 g/dL (ref 3.5–5.0)
Alkaline Phosphatase: 158 U/L — ABNORMAL HIGH (ref 38–126)
Anion gap: 9 (ref 5–15)
BUN: 18 mg/dL (ref 6–20)
CO2: 26 mmol/L (ref 22–32)
Calcium: 9.2 mg/dL (ref 8.9–10.3)
Chloride: 104 mmol/L (ref 101–111)
Creatinine, Ser: 0.83 mg/dL (ref 0.44–1.00)
GFR calc Af Amer: >60 mL/min (ref >60)
GFR calc non Af Amer: >60 mL/min (ref >60)
Glucose, Bld: 65 mg/dL (ref 65–99)
Potassium: 3.7 mmol/L (ref 3.5–5.1)
Sodium: 139 mmol/L (ref 135–145)
Total Bilirubin: 0.5 mg/dL (ref 0.3–1.2)
Total Protein: 6.9 g/dL (ref 6.5–8.1)

## 2017-05-01 LAB — I-STAT TROPONIN, ED: Troponin i, poc: 0 ng/mL (ref 0.00–0.08)

## 2017-05-01 LAB — APTT: aPTT: 45 seconds — ABNORMAL HIGH (ref 24–36)

## 2017-05-01 LAB — TSH: TSH: 4.289 u[IU]/mL (ref 0.350–4.500)

## 2017-05-01 LAB — PROTIME-INR
INR: 1.25
Prothrombin Time: 15.6 seconds — ABNORMAL HIGH (ref 11.4–15.2)

## 2017-05-01 LAB — CBG MONITORING, ED
Glucose-Capillary: 92 mg/dL (ref 65–99)
Glucose-Capillary: 98 mg/dL (ref 65–99)

## 2017-05-01 LAB — ETHANOL: Alcohol, Ethyl (B): <10 mg/dL (ref <10)

## 2017-05-01 LAB — I-STAT BETA HCG BLOOD, ED (MC, WL, AP ONLY): I-stat hCG, quantitative: <5 m[IU]/mL (ref <5)

## 2017-05-01 MED ORDER — VITAMIN D (ERGOCALCIFEROL) 1.25 MG (50000 UNIT) PO CAPS: 50000.0000 [IU] | ORAL_CAPSULE | ORAL | Status: DC

## 2017-05-01 MED ORDER — SODIUM CHLORIDE 0.9 % IV SOLN: INTRAVENOUS | Status: DC

## 2017-05-01 MED ORDER — FERROUS SULFATE 325 (65 FE) MG PO TABS
325.0000 mg | ORAL_TABLET | Freq: Every day | ORAL | Status: DC
  Administered 2017-05-02 – 2017-05-03 (×2): 325 mg via ORAL
  Filled 2017-05-01 (×4): qty 1

## 2017-05-01 MED ORDER — ASPIRIN 300 MG RE SUPP
300.0000 mg | Freq: Every day | RECTAL | Status: DC
  Filled 2017-05-01: qty 1

## 2017-05-01 MED ORDER — ONDANSETRON 4 MG PO TBDP
ORAL_TABLET | ORAL | Status: AC
  Filled 2017-05-01: qty 1

## 2017-05-01 MED ORDER — ACETAMINOPHEN 325 MG PO TABS: 650.0000 mg | ORAL_TABLET | ORAL | Status: DC | PRN

## 2017-05-01 MED ORDER — METHYLPHENIDATE HCL 5 MG PO TABS
5.0000 mg | ORAL_TABLET | Freq: Three times a day (TID) | ORAL | Status: DC
  Administered 2017-05-02 – 2017-05-03 (×4): 5 mg via ORAL
  Filled 2017-05-01 (×5): qty 1

## 2017-05-01 MED ORDER — TOPIRAMATE 25 MG PO TABS
25.0000 mg | ORAL_TABLET | Freq: Two times a day (BID) | ORAL | Status: DC
  Administered 2017-05-01 – 2017-05-02 (×2): 25 mg via ORAL
  Filled 2017-05-01 (×2): qty 1

## 2017-05-01 MED ORDER — TRAZODONE HCL 50 MG PO TABS
50.0000 mg | ORAL_TABLET | Freq: Every evening | ORAL | Status: DC | PRN
  Administered 2017-05-02: 50 mg via ORAL
  Administered 2017-05-02: 100 mg via ORAL
  Filled 2017-05-01: qty 2
  Filled 2017-05-01 (×2): qty 1

## 2017-05-01 MED ORDER — DONEPEZIL HCL 5 MG PO TABS
10.0000 mg | ORAL_TABLET | Freq: Every day | ORAL | Status: DC
  Administered 2017-05-01 – 2017-05-02 (×2): 10 mg via ORAL
  Filled 2017-05-01 (×4): qty 2

## 2017-05-01 MED ORDER — POLYETHYLENE GLYCOL 3350 17 G PO PACK: 17.0000 g | PACK | Freq: Two times a day (BID) | ORAL | Status: DC | PRN

## 2017-05-01 MED ORDER — LEVOTHYROXINE SODIUM 25 MCG PO TABS
125.0000 ug | ORAL_TABLET | Freq: Every day | ORAL | Status: DC
  Administered 2017-05-02 – 2017-05-03 (×2): 125 ug via ORAL
  Filled 2017-05-01 (×2): qty 1

## 2017-05-01 MED ORDER — PANTOPRAZOLE SODIUM 40 MG PO TBEC
80.0000 mg | DELAYED_RELEASE_TABLET | Freq: Every day | ORAL | Status: DC
  Administered 2017-05-02 – 2017-05-03 (×2): 80 mg via ORAL
  Filled 2017-05-01 (×2): qty 2

## 2017-05-01 MED ORDER — HYDROXYZINE HCL 10 MG PO TABS
10.0000 mg | ORAL_TABLET | Freq: Three times a day (TID) | ORAL | Status: DC | PRN
  Filled 2017-05-01: qty 1

## 2017-05-01 MED ORDER — ADULT MULTIVITAMIN W/MINERALS CH
1.0000 | ORAL_TABLET | Freq: Two times a day (BID) | ORAL | Status: DC
  Administered 2017-05-01 – 2017-05-03 (×4): 1 via ORAL
  Filled 2017-05-01 (×4): qty 1

## 2017-05-01 MED ORDER — IOPAMIDOL (ISOVUE-370) INJECTION 76%
125.0000 mL | Freq: Once | INTRAVENOUS | Status: AC | PRN
  Administered 2017-05-01: 125 mL via INTRAVENOUS

## 2017-05-01 MED ORDER — ACETAMINOPHEN 160 MG/5ML PO SOLN: 650.0000 mg | ORAL | Status: DC | PRN

## 2017-05-01 MED ORDER — VITAMIN C 500 MG PO TABS
500.0000 mg | ORAL_TABLET | Freq: Every day | ORAL | Status: DC
  Administered 2017-05-02 – 2017-05-03 (×2): 500 mg via ORAL
  Filled 2017-05-01 (×4): qty 1

## 2017-05-01 MED ORDER — BACITRACIN ZINC 500 UNIT/GM EX OINT: 1.0000 "application " | TOPICAL_OINTMENT | Freq: Three times a day (TID) | CUTANEOUS | Status: DC | PRN

## 2017-05-01 MED ORDER — TRAMADOL HCL 50 MG PO TABS
50.0000 mg | ORAL_TABLET | Freq: Four times a day (QID) | ORAL | Status: DC | PRN
  Administered 2017-05-01: 50 mg via ORAL
  Filled 2017-05-01: qty 1

## 2017-05-01 MED ORDER — MUPIROCIN 2 % EX OINT
1.0000 "application " | TOPICAL_OINTMENT | Freq: Three times a day (TID) | CUTANEOUS | Status: DC
  Administered 2017-05-01 – 2017-05-03 (×6): 1 via TOPICAL
  Filled 2017-05-01 (×2): qty 22

## 2017-05-01 MED ORDER — BACLOFEN 10 MG PO TABS: 10.0000 mg | ORAL_TABLET | Freq: Three times a day (TID) | ORAL | Status: DC | PRN

## 2017-05-01 MED ORDER — STROKE: EARLY STAGES OF RECOVERY BOOK
Freq: Once | Status: AC
  Administered 2017-05-01: 18:00:00
  Filled 2017-05-01 (×2): qty 1

## 2017-05-01 MED ORDER — GABAPENTIN 300 MG PO CAPS
600.0000 mg | ORAL_CAPSULE | Freq: Three times a day (TID) | ORAL | Status: DC
  Administered 2017-05-01 – 2017-05-03 (×6): 600 mg via ORAL
  Filled 2017-05-01 (×6): qty 2

## 2017-05-01 MED ORDER — ASPIRIN 325 MG PO TABS
325.0000 mg | ORAL_TABLET | Freq: Every day | ORAL | Status: DC
  Administered 2017-05-01 – 2017-05-02 (×2): 325 mg via ORAL
  Filled 2017-05-01 (×3): qty 1

## 2017-05-01 MED ORDER — CEREFOLIN 6-1-50-5 MG PO TABS: 1.0000 | ORAL_TABLET | Freq: Every day | ORAL | Status: DC

## 2017-05-01 MED ORDER — DULOXETINE HCL 60 MG PO CPEP
60.0000 mg | ORAL_CAPSULE | Freq: Two times a day (BID) | ORAL | Status: DC
  Administered 2017-05-01 – 2017-05-03 (×4): 60 mg via ORAL
  Filled 2017-05-01 (×4): qty 1

## 2017-05-01 MED ORDER — RIVAROXABAN 20 MG PO TABS
20.0000 mg | ORAL_TABLET | Freq: Every day | ORAL | Status: DC
  Administered 2017-05-01 – 2017-05-02 (×2): 20 mg via ORAL
  Filled 2017-05-01 (×2): qty 1

## 2017-05-01 MED ORDER — MIRABEGRON ER 25 MG PO TB24
50.0000 mg | ORAL_TABLET | Freq: Every day | ORAL | Status: DC
  Administered 2017-05-01 – 2017-05-03 (×3): 50 mg via ORAL
  Filled 2017-05-01: qty 2
  Filled 2017-05-01 (×2): qty 1
  Filled 2017-05-01: qty 2
  Filled 2017-05-01: qty 1
  Filled 2017-05-01: qty 2

## 2017-05-01 MED ORDER — DEXTROSE 50 % IV SOLN
50.0000 mL | Freq: Once | INTRAVENOUS | Status: AC
  Administered 2017-05-01: 50 mL via INTRAVENOUS

## 2017-05-01 MED ORDER — FOLIC ACID 1 MG PO TABS
1.0000 mg | ORAL_TABLET | Freq: Every day | ORAL | Status: DC
  Administered 2017-05-02 – 2017-05-03 (×2): 1 mg via ORAL
  Filled 2017-05-01 (×2): qty 1

## 2017-05-01 MED ORDER — OXYCODONE HCL 5 MG PO TABS
10.0000 mg | ORAL_TABLET | ORAL | Status: DC | PRN
  Administered 2017-05-01 – 2017-05-03 (×8): 10 mg via ORAL
  Filled 2017-05-01 (×8): qty 2

## 2017-05-01 MED ORDER — ONDANSETRON 4 MG PO TBDP
4.0000 mg | ORAL_TABLET | Freq: Four times a day (QID) | ORAL | Status: DC | PRN
  Administered 2017-05-01 – 2017-05-02 (×2): 4 mg via ORAL
  Filled 2017-05-01: qty 1

## 2017-05-01 MED ORDER — VITAMIN B-12 1000 MCG PO TABS
5000.0000 ug | ORAL_TABLET | Freq: Every day | ORAL | Status: DC
  Administered 2017-05-02 – 2017-05-03 (×2): 5000 ug via ORAL
  Filled 2017-05-01 (×4): qty 5

## 2017-05-01 MED ORDER — DEXTROSE 50 % IV SOLN
INTRAVENOUS | Status: AC
  Filled 2017-05-01: qty 50

## 2017-05-01 MED ORDER — ACETAMINOPHEN 650 MG RE SUPP: 650.0000 mg | RECTAL | Status: DC | PRN

## 2017-05-01 MED ORDER — METHOCARBAMOL 500 MG PO TABS
750.0000 mg | ORAL_TABLET | Freq: Three times a day (TID) | ORAL | Status: DC
  Administered 2017-05-01 – 2017-05-03 (×6): 750 mg via ORAL
  Filled 2017-05-01 (×6): qty 2

## 2017-05-01 MED ORDER — SENNOSIDES-DOCUSATE SODIUM 8.6-50 MG PO TABS
1.0000 | ORAL_TABLET | Freq: Every day | ORAL | Status: DC
  Administered 2017-05-01 – 2017-05-02 (×2): 1 via ORAL
  Filled 2017-05-01 (×4): qty 1

## 2017-05-01 NOTE — ED Provider Notes (Signed)
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 FantasiaD.   On: 05/01/2017 13:42   Ct Cerebral Perfusion W Contrast Result Date: 05/01/2017 CLINICAL DATA:  Stroke follow-up. Confusion and expressive aphasia beginning today. EXAM: CT ANGIOGRAPHY HEAD AND NECK CT PERFUSION BRAIN TECHNIQUE: Multidetector CT imaging of the head and neck was performed using the standard protocol during bolus administration of intravenous contrast. Multiplanar CT image reconstructions and MIPs were obtained to evaluate the  vascular anatomy. Carotid stenosis measurements (when applicable) are obtained utilizing NASCET criteria, using the distal internal carotid diameter as the denominator. Multiphase CT imaging of the brain was performed following IV bolus contrast injection. Subsequent parametric perfusion maps were calculated using RAPID software. CONTRAST:  [id number] IOPAMIDOL (ISOVUE-370) INJECTION 76% COMPARISON:  Noncontrast head CT from earlier today. FINDINGS: CTA NECK FINDINGS Aortic arch: Normal.  Four vessel branching pattern. Right carotid system: ICA tortuosity with looping. Vessels are smooth and diffusely patent. No atheromatous changes. Left carotid system: ICA tortuosity. Vessels are smooth and diffusely patent. No noted atheromatous changes. Vertebral arteries: Strong right vertebral artery dominance. Skeleton: No acute or aggressive finding Other neck: Negative Upper chest: Negative Review of the MIP images confirms the above findings CTA HEAD FINDINGS Anterior circulation: Small right A1 segment, developmental appearing. No major branch occlusion, flow limiting stenosis, or beading. 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 3D reformats reviewed in [employer] but images could not be explored to PACS. Posterior circulation: Fetal type PCA on the right. Strong right vertebral artery dominance. Major vessels are smooth and diffusely patent. Venous sinuses: Patent Anatomic variants: Fetal type right PCA and hypoplastic right A1 segment. Delayed phase: No abnormal intracranial enhancement Review of the MIP images confirms the above findings CT Brain Perfusion Findings: CBF (<30%) Volume: 68mL 71mfusion (Tmax>6.0s) volume: 68mL4m  IMPRESSION: 1. No emergent large vessel occlusion or acute infarct/penumbra by CT perfusion. 2. Remote left PCA distribution infarct. 3. No noted atheromatous changes or stenosis. 4. 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 Recommend follow-up in this young patient. Electronically Signed   By: Monte Fantasia M.D.   On: 05/01/2017 13:42    Ct Head Code Stroke Wo Contrast Result Date: 05/01/2017 CLINICAL DATA:  Code stroke. Sudden onset of confusion and expressive aphasia beginning 2 hours ago. EXAM: CT HEAD WITHOUT CONTRAST TECHNIQUE: Contiguous axial images were obtained from the base of the skull through the vertex without intravenous contrast. COMPARISON:  MRI brain 01/26/2017 FINDINGS: Brain: Remote encephalomalacia of the left posterior inferior temporal and inferior occipital lobe is stable. No acute infarct, hemorrhage, or mass lesion is present. The ventricles are of normal size. No significant extra-axial fluid collection is present. Brainstem and cerebellum are within normal limits. Vascular: No hyperdense vessel or unexpected calcification. Skull: Calvarium is intact. No focal lytic or blastic lesions are present. Sinuses/Orbits: The paranasal sinuses and mastoid air cells are clear. Globes and orbits are within normal limits. ASPECTS ([place] Stroke Program Early CT Score) - Ganglionic level infarction (caudate, lentiform nuclei, internal capsule, insula, M1-M3 cortex): 7/7 - Supraganglionic infarction (M4-M6 cortex): 3/3 Total score (0-10 with 10 being normal): 10/10 IMPRESSION: 1. Stable encephalomalacia of the inferior left occipital and temporal lobe compatible with a remote PCA territory infarct. 2. No acute intracranial abnormality. 3. ASPECTS is 10/10 These results were called by telephone at the time of interpretation on 05/01/2017 at 12:41 pm to Dr. Francine Graven , who verbally acknowledged these results. Electronically Signed   By: San Morelle M.D.   On: 05/01/2017 12:45     1220:  Code Stroke called on pt's arrival.  1420:  [facility] MD has evaluated pt: workup as above, not TPA or thrombectomy candidate, likely TIA, admit for further workup. IV D50 given for hypoglycemia.   Dx and testing d/w pt and family.  Questions answered.  Verb understanding, agreeable to admit. T/C returned from [facility] Dr. Carles Collet, case discussed, including:  HPI, pertinent PM/SHx, VS/PE, dx testing, ED course and treatment:  Agreeable to admit.      Final Clinical Impressions(s) / ED Diagnoses   Final diagnoses:  Aphasia  TIA (transient ischemic attack)  Confusion    ED Discharge Orders    None       Francine Graven, DO 05/04/17 1254

## 2017-05-01 NOTE — ED Notes (Signed)
1222- PT went to CT with nursing at this time.

## 2017-05-01 NOTE — ED Notes (Signed)
Code Stroke called at this time and Teleneurology cart activated at this time.

## 2017-05-01 NOTE — H&P (Addendum)
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 extra-axial fluid collection is present. Brainstem and cerebellum are within normal limits. Vascular: No hyperdense vessel or unexpected calcification. Skull:  Calvarium is intact. No focal lytic or blastic lesions are present. Sinuses/Orbits: The paranasal sinuses and mastoid air cells are clear. Globes and orbits are within normal limits. ASPECTS ([place] Stroke Program Early CT Score) - Ganglionic level infarction (caudate, lentiform nuclei, internal capsule, insula, M1-M3 cortex): 7/7  - Supraganglionic infarction (M4-M6 cortex): 3/3 Total score (0-10 with 10 being normal): 10/10 IMPRESSION: 1. Stable encephalomalacia of the inferior left occipital and temporal lobe compatible with a remote PCA territory infarct. 2. No acute intracranial abnormality. 3. ASPECTS is 10/10 These results were called by telephone at the time of interpretation on 05/01/2017 at 12:41 pm to Dr. Francine Graven , who verbally acknowledged these results. Electronically Signed   By: San Morelle M.D.   On: 05/01/2017 12:45    EKG: Independently reviewed. Sinus, no ST-T change    Time spent:70 minutes Code Status:   FULL Family Communication:  No Family at bedside Disposition Plan: expect 1-2 day hospitalization Consults called: neurology, GI DVT Prophylaxis: Xarelto  Orson Eva, DO  [facility] Pager [phone]  If 7PM-7AM, please contact night-coverage [email or web address] Password [email or web address] 05/01/2017, 3:01 PM

## 2017-05-01 NOTE — Progress Notes (Signed)
Pt c/o dizziness and not feeling well. Orthostatic VS obtained and WNL. FSBS checked and 236 at 2355. FSBS 72 at 2052. Possibly cause of not feeling well. Pt stated ate graham crackers and peanut butter earlier this evening and with recent DM dx. Will continue to monitor.

## 2017-05-01 NOTE — ED Notes (Signed)
Per MD, pt is not a candidate for Thrombectomy. Will now be doing neuro checks and VS every 2 hours.

## 2017-05-01 NOTE — ED Triage Notes (Addendum)
PT states she was driving and started having some confusion and expressive aphasia at 1030 today. PT takes Xarelto.

## 2017-05-01 NOTE — ED Notes (Signed)
ED Provider at bedside. 

## 2017-05-01 NOTE — Consult Note (Addendum)
TeleNeurology Consult Note  Debbie Pineda     Consulting Physician: [name] Code Status: Prior Primary Care Physician:  Debbie Pineda  DOB:  1960/10/25   Age: 57 y.o.  Date of Service: May 01, 2017 Admit Date:  05/01/2017   Admitting Diagnosis: <principal problem not specified>  Subjective:  Reason for Consultation: stroke  Chief Complaint: speech difficulty  History of present illness: 57 y/o woman presents with speech difficulty and AMS. Patient with previous stroke with a right hemianopsia since 2013. Spinal stimulator placed approximately 2 weeks ago.Emergent telestroke consult requested. 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     Review of Systems  Patient Active Problem List   Diagnosis Date Noted  . Presence of right artificial knee joint 09/17/2016  . H/O total knee replacement, right 08/15/2016  . Presence of retained hardware   . Memory disorder 08/14/2016  . Cerebrovascular disease 08/14/2016  . Unilateral primary osteoarthritis, right knee 05/29/2016  . Primary osteoarthritis of both feet 05/29/2016  . Trochanteric bursitis of both hips 05/25/2016  . Neck pain 05/25/2016  . Urinary urgency 04/19/2016  . Chronic low back pain 01/11/2016  . Depression 11/29/2015  . 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 Heart palpitations 12/14/2013  . Generalized osteoarthritis of multiple sites 11/04/2013  . Cerebral infarction ([place]) 10/30/2011  . PFO (patent foramen ovale) 10/30/2011  . Bradycardia 10/30/2011  . Chest pain 10/30/2011  . Hypothyroid   . Thrombophlebitis   . Sleep apnea   . Fibromyalgia   . 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 Status post bariatric surgery 12/07/2010  . Vein disorder 11/29/2010  . S/P total hysterectomy and bilateral salpingo-oophorectomy 11/29/2010  . S/P exploratory laparotomy 11/29/2010  . S/P cholecystectomy 11/29/2010  . S/P ACL surgery 11/29/2010  . Morbid obesity  ([place]) 11/10/2010   Past Medical History:  Diagnosis Date  . ADD (attention deficit disorder)    takes Adderall daily  . Arthritis    "all over"  . Cerebral infarction ([place]) 10/30/2011  . Cerebrovascular disease 08/14/2016  . Chronic back pain    "all over"  . Chronic low back pain 01/11/2016  . Complication of anesthesia    tends to have hypotension when NPO and post-anesthesia  . Constipation    takes stool softener daily  . Degenerative disk disease   . Degenerative joint disease   . Depression    takes Cymbalta daily for pain per pt  . Diabetes mellitus without complication ([place])   . DVT (deep venous thrombosis) ([facility])    RLE  . Family history of adverse reaction to anesthesia    a family member woke up during surgery; "think it was my mom"  . Fibromyalgia   . Generalized osteoarthritis of multiple sites 11/04/2013  . GERD (gastroesophageal reflux disease)   . Heart palpitations 12/14/2013  . History of blood clots    superficial  . Hypoglycemia   . Hypothyroid    takes Synthroid daily  . Incomplete emptying of bladder   . Insomnia    takes Trazodone nightly  . Iron deficiency anemia    takes Ferrous Sulfate daily  . Joint pain   . Joint swelling    knees and ankles  . Memory disorder 08/14/2016  . Morbid obesity ([place])   . Nausea    takes Zofran as needed.Seeing GI doc  . Neck pain 05/25/2016  . OSA on CPAP    tested  more than 5 yrs ago.    . Osteoarthritis   . PFO (patent foramen ovale)   . Primary osteoarthritis of both feet 05/29/2016   Right bunionectomy August 2017 by Dr. Sharol Given  . Scoliosis   . Sleep apnea   . Stroke [facility]) "several"   right foot weakness; memory issues, black spot right visual field since" (03/23/2015)  . Thrombophlebitis   . Trochanteric bursitis of both hips 05/25/2016  . Unilateral primary osteoarthritis, right knee 05/29/2016  . Urinary urgency 04/19/2016  . Vein disorder 11/29/2010   Past Surgical History:  Procedure Laterality Date  .  BUNIONECTOMY Right 08/2015  . CARDIAC CATHETERIZATION     2008.  "it was fine" (not sure why she had it done, and doesn't know where)  . COLONOSCOPY N/A 03/25/2013   Procedure: COLONOSCOPY;  Surgeon: Rogene Houston, MD;  [facility];  Service: Endoscopy;  Laterality: N/A;  930  . ESOPHAGOGASTRODUODENOSCOPY    . EXPLORATORY LAPAROTOMY     "took fallopian tubes out"  . JOINT REPLACEMENT     bil knee   . KNEE ARTHROPLASTY    . KNEE ARTHROSCOPY Left   . KNEE ARTHROSCOPY W/ ACL RECONSTRUCTION Right    "added pins"  . LAPAROSCOPIC CHOLECYSTECTOMY  ~ 2001  . 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 SPINAL CORD STIMULATOR INSERTION N/A 04/18/2017   Procedure: LUMBAR SPINAL CORD STIMULATOR INSERTION;  Surgeon: Clydell Hakim, MD;  Location: [place];  Service: Neurosurgery;  Laterality: N/A;  LUMBAR SPINAL CORD STIMULATOR INSERTION  . TOTAL KNEE ARTHROPLASTY Left 03/23/2015   Procedure: TOTAL KNEE ARTHROPLASTY;  Surgeon: Newt Minion, MD;  Location: [place];  Service: Orthopedics;  Laterality: Left;  . TOTAL KNEE ARTHROPLASTY Right 08/15/2016   Procedure: RIGHT TOTAL KNEE ARTHROPLASTY, REMOVAL ACL SCREWS;  Surgeon: Newt Minion, MD;  Location: [place];  Service: Orthopedics;  Laterality: Right;  . TOTAL KNEE ARTHROPLASTY WITH HARDWARE REMOVAL Right   . VAGINAL HYSTERECTOMY    . VARICOSE VEIN SURGERY Right X 2   Allergies  Allergen Reactions  . Lyrica [Pregabalin] Other (See Comments)    SOB, lower extremity edema and weight gain  . Flexeril [Cyclobenzaprine Hcl] Other (See Comments)    Numbness of extremities  . Morphine And Related Itching    Upper torso  . Sulfamethoxazole-Trimethoprim Itching and Rash    Bactrim  . Belsomra [Suvorexant] Other (See Comments)    unknown  . Tape Itching and Rash    Please use "paper" tape Rash if left on longer than 24 hrs    Social History   Socioeconomic History  . Marital status: Married    Spouse name: Jeneen Rinks  . Number of children: 1  .  Years of education: 73  . Highest education level: Not on file  Occupational History  . Occupation: Disability  . Occupation: formerly Therapist, sports, [employer]  Social Needs  . Financial resource strain: Not on file  . Food insecurity:    Worry: Not on file    Inability: Not on file  . Transportation needs:    Medical: Not on file    Non-medical: Not on file  Tobacco Use  . Smoking status: Former Smoker    Packs/day: 0.75    Years: 8.00    Pack years: 6.00    Types: Cigarettes    Last attempt to quit: 12/01/1990    Years since quitting: 26.4  . Smokeless tobacco: Never Used  . Tobacco comment: quit smoking in the  1990s  Substance and Sexual Activity  . Alcohol use: No    Comment: 03/23/2015 "stopped drinking in 2012 w/gastric bypass; drank socially before bypass"  . Drug use: No  . Sexual activity: Not Currently    Birth control/protection: Surgical  Lifestyle  . Physical activity:    Days per week: Not on file    Minutes per session: Not on file  . Stress: Not on file  Relationships  . Social connections:    Talks on phone: Not on file    Gets together: Not on file    Attends religious service: Not on file    Active member of club or organization: Not on file    Attends meetings of clubs or organizations: Not on file    Relationship status: Not on file  . Intimate partner violence:    Fear of current or ex partner: Not on file    Emotionally abused: Not on file    Physically abused: Not on file    Forced sexual activity: Not on file  Other Topics Concern  . Not on file  Social History Narrative   Lives with husband   Caffeine use: No soda   Mainly water, drinks decaf tea   Right handed   Family History Family History  Problem Relation Age of Onset  . Heart disease Father   . Cancer Father   . Cancer Mother        skin cancer   . Heart disease Brother   . Cancer Brother   . Diabetes Brother   . Stroke Brother   . Heart disease Sister   . Cancer Maternal Grandfather   .  Hypothyroidism Daughter   . Colon cancer Neg Hx       Objective:  Vital signs in last 24 hours: Weight:  [95.3 kg (210 lb)] 95.3 kg (210 lb) (04/10 1223) No data recorded.    Intake/Output last 3 shifts: No intake/output data recorded.  Intake/Output this shift: No intake/output data recorded.  Physical Exam 1a- LOC: Keenly responsive - 0      1b- LOC questions: Answers 1 of 2  questions correctly - 1 (missed age)     1c- LOC commands- Performs both tasks correctly- 0     2- Gaze: Normal; no gaze paresis or gaze deviation - 0     3- Visual Fields: right hemianopsia - chronic 2     4- Facial movements: no facial palsy - 0     5a- Right Upper limb motor - no drift -0     5b -Left Upper limb motor - no drift -0     6a- Right Lower limb motor - no drift - 0      6b- Left Lower limb motor - no drift - 0 7- Limb Coordination: absent ataxia - 0      8- Sensory : no sensory loss - 0      9- Language - No aphasia - 0      10- Speech - No dysarthria -0     11- Neglect / Extinction - none found -0     NIHSS score   3  Diagnostic Findings:  Pertinent Labs: No results for input(s): WBC, MCV in the last 168 hours.  Invalid input(s): HEMATOCRIT, HEMOGLOBIN, PLATELETS No results for input(s): POTASSIUM, CHLORIDE, CALCIUM, BUN, GLUCOSE, CREATININE, CO2 in the last 168 hours.  Invalid input(s): SODIUM, ANION GAP2, GFR MDRD AF AMER No results for input(s): AST, ALT, ALBUMIN in the  last 168 hours.  Invalid input(s): ALK PHOS, BILIRUBIN TOTAL, BILIRUBIN DIRECT, PROTEIN TOTAL, GLOBULIN No results for input(s): TRIG in the last 168 hours.  Invalid input(s): CHLPL, HDL PML, VLDL CALC, LDL CALC, CHOL/HDL RATIO, LDL/HDL RATIO, NON-HDL CHOLESTEROL Invalid input(s): CK TOTAL, TROPONIN I, CK MB No results for input(s): APTT in the last 168 hours. No results for input(s): INR in the last 168 hours.  Invalid input(s): PROTHROMBIN TIME  Extensive review of previous medical records completed.     Assessment: Patient Active Problem List   Diagnosis Date Noted  . Presence of right artificial knee joint 09/17/2016  . H/O total knee replacement, right 08/15/2016  . Presence of retained hardware   . Memory disorder 08/14/2016  . Cerebrovascular disease 08/14/2016  . Unilateral primary osteoarthritis, right knee 05/29/2016  . Primary osteoarthritis of both feet 05/29/2016  . Trochanteric bursitis of both hips 05/25/2016  . Neck pain 05/25/2016  . Urinary urgency 04/19/2016  . Chronic low back pain 01/11/2016  . Depression 11/29/2015  . 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 Heart palpitations 12/14/2013  . Generalized osteoarthritis of multiple sites 11/04/2013  . Cerebral infarction ([place]) 10/30/2011  . PFO (patent foramen ovale) 10/30/2011  . Bradycardia 10/30/2011  . Chest pain 10/30/2011  . Hypothyroid   . Thrombophlebitis   . Sleep apnea   . Fibromyalgia   . 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 Status post bariatric surgery 12/07/2010  . Vein disorder 11/29/2010  . S/P total hysterectomy and bilateral salpingo-oophorectomy 11/29/2010  . S/P exploratory laparotomy 11/29/2010  . S/P cholecystectomy 11/29/2010  . S/P ACL surgery 11/29/2010  . Morbid obesity ([place]) 11/10/2010     Plan: [facility] TeleNeurology Consult Services  Impression: TIA/Stroke     Differential Diagnosis:  1. Cardioembolic stroke  2. Small vessel disease/lacune    3. Thromboembolic, artery-to-artery mechanism 4. Hypercoagulable state-related infarct  5. Thrombotic mechanism, large artery disease 6. Transient ischemic attack  Comments: Last known well:   0830 Door time:1211 [facility] contacted:   1223 [facility] at bedside:   1226 NIHSS assessment time:1230 CT reviewed at 6269 Needle time:TPA not given since she is on Xarelto.  Thrombectomy not considered since large vessel occlusion is not suspected     Discussion:     Our recommendations  are outlined below.  Recommendations: STAT CTA head/neck and perfusion ASA, IV fluids and permissive hypertension (Keep SBP < 220/110) Neurochecks DVT prophylaxis    Follow up with Neurology for further testing and evaluation  Medical Decision Making:  - Extensive number of diagnosis or management options are considered above. - Extensive amount of complex data reviewed.  - High risk of complication and/or morbidity or mortality are associated with differential diagnostic considerations above. - There may be uncertain outcome and increased probability of prolonged functional impairment or high probability of severe prolonged functional impairment associated with some of these differential diagnosis.  Medical Data Reviewed: 1.Data reviewed include clinical labs, radiology, Medical Tests; 2.Tests results discussed w/performing or interpreting physician;    3.Obtaining/reviewing old medical records; 4.Obtaining case history from another source;    5.Independent review of image, tracing or specimen.  Signed:  05/01/2017  12:29 PM   Addendum - 1320 No large vessel occlusion, no large penumbra as per my review. Not a thrombectomy candidate. Formal radiology read pending. Case discussed with ED staff.

## 2017-05-02 ENCOUNTER — Observation Stay (HOSPITAL_COMMUNITY)
Admit: 2017-05-02 | Discharge: 2017-05-02 | Disposition: A | Discharge status: Home / Self Care | Payer: PPO | Attending: Internal Medicine | Admitting: Internal Medicine

## 2017-05-02 ENCOUNTER — Ambulatory Visit (INDEPENDENT_AMBULATORY_CARE_PROVIDER_SITE_OTHER): Payer: PPO | Admitting: Specialist

## 2017-05-02 ENCOUNTER — Observation Stay (HOSPITAL_BASED_OUTPATIENT_CLINIC_OR_DEPARTMENT_OTHER): Payer: PPO

## 2017-05-02 ENCOUNTER — Observation Stay (HOSPITAL_COMMUNITY): Payer: PPO

## 2017-05-02 DIAGNOSIS — R1010 Upper abdominal pain, unspecified: Secondary | ICD-10-CM

## 2017-05-02 DIAGNOSIS — I6789 Other cerebrovascular disease: Secondary | ICD-10-CM

## 2017-05-02 DIAGNOSIS — G459 Transient cerebral ischemic attack, unspecified: Secondary | ICD-10-CM | POA: Diagnosis not present

## 2017-05-02 DIAGNOSIS — R740 Nonspecific elevation of levels of transaminase and lactic acid dehydrogenase [LDH]: Secondary | ICD-10-CM

## 2017-05-02 DIAGNOSIS — R7401 Elevation of levels of liver transaminase levels: Secondary | ICD-10-CM

## 2017-05-02 DIAGNOSIS — K838 Other specified diseases of biliary tract: Secondary | ICD-10-CM

## 2017-05-02 DIAGNOSIS — R4182 Altered mental status, unspecified: Secondary | ICD-10-CM | POA: Diagnosis not present

## 2017-05-02 DIAGNOSIS — R569 Unspecified convulsions: Secondary | ICD-10-CM | POA: Diagnosis not present

## 2017-05-02 LAB — GLUCOSE, CAPILLARY
Glucose-Capillary: 96 mg/dL (ref 65–99)
Glucose-Capillary: 170 mg/dL — ABNORMAL HIGH (ref 65–99)
Glucose-Capillary: 105 mg/dL — ABNORMAL HIGH (ref 65–99)
Glucose-Capillary: 131 mg/dL — ABNORMAL HIGH (ref 65–99)
Glucose-Capillary: 64 mg/dL — ABNORMAL LOW (ref 65–99)

## 2017-05-02 LAB — ECHOCARDIOGRAM COMPLETE
Height: 65 in
Weight: 3421.54 oz

## 2017-05-02 LAB — HEPATITIS B SURFACE ANTIGEN: Hepatitis B Surface Ag: NEGATIVE

## 2017-05-02 LAB — URINALYSIS, COMPLETE (UACMP) WITH MICROSCOPIC
Bacteria, UA: NONE SEEN
Bilirubin Urine: NEGATIVE
Glucose, UA: NEGATIVE mg/dL
Hgb urine dipstick: NEGATIVE
Ketones, ur: NEGATIVE mg/dL
Leukocytes, UA: NEGATIVE
Nitrite: NEGATIVE
Protein, ur: NEGATIVE mg/dL
RBC / HPF: NONE SEEN RBC/hpf (ref 0–5)
Specific Gravity, Urine: 1.01 (ref 1.005–1.030)
Squamous Epithelial / LPF: NONE SEEN
WBC, UA: NONE SEEN WBC/hpf (ref 0–5)
pH: 7 (ref 5.0–8.0)

## 2017-05-02 LAB — LIPID PANEL
Cholesterol: 151 mg/dL (ref 0–200)
HDL: 74 mg/dL (ref >40)
LDL Cholesterol: 66 mg/dL (ref 0–99)
Total CHOL/HDL Ratio: 2 RATIO
Triglycerides: 54 mg/dL (ref <150)
VLDL: 11 mg/dL (ref 0–40)

## 2017-05-02 LAB — COMPREHENSIVE METABOLIC PANEL
ALT: 149 U/L — ABNORMAL HIGH (ref 14–54)
AST: 104 U/L — ABNORMAL HIGH (ref 15–41)
Albumin: 3.4 g/dL — ABNORMAL LOW (ref 3.5–5.0)
Alkaline Phosphatase: 120 U/L (ref 38–126)
Anion gap: 8 (ref 5–15)
BUN: 16 mg/dL (ref 6–20)
CO2: 26 mmol/L (ref 22–32)
Calcium: 8.8 mg/dL — ABNORMAL LOW (ref 8.9–10.3)
Chloride: 108 mmol/L (ref 101–111)
Creatinine, Ser: 0.79 mg/dL (ref 0.44–1.00)
GFR calc Af Amer: >60 mL/min (ref >60)
GFR calc non Af Amer: >60 mL/min (ref >60)
Glucose, Bld: 89 mg/dL (ref 65–99)
Potassium: 4.1 mmol/L (ref 3.5–5.1)
Sodium: 142 mmol/L (ref 135–145)
Total Bilirubin: 0.3 mg/dL (ref 0.3–1.2)
Total Protein: 5.8 g/dL — ABNORMAL LOW (ref 6.5–8.1)

## 2017-05-02 LAB — ACETAMINOPHEN LEVEL: Acetaminophen (Tylenol), Serum: <10 ug/mL — ABNORMAL LOW (ref 10–30)

## 2017-05-02 LAB — HEMOGLOBIN A1C
Hgb A1c MFr Bld: 5 % (ref 4.8–5.6)
Mean Plasma Glucose: 96.8 mg/dL

## 2017-05-02 LAB — HEPATITIS C ANTIBODY: HCV Ab: <0.1 s/co ratio (ref 0.0–0.9)

## 2017-05-02 MED ORDER — TOPIRAMATE 25 MG PO TABS
50.0000 mg | ORAL_TABLET | Freq: Two times a day (BID) | ORAL | Status: DC
  Administered 2017-05-02 – 2017-05-03 (×2): 50 mg via ORAL
  Filled 2017-05-02 (×2): qty 2

## 2017-05-02 NOTE — Progress Notes (Signed)
EEG complete - results pending 

## 2017-05-02 NOTE — Care Management Note (Signed)
Case Management Note  Patient Details  Name: Debbie Pineda MRN: [id number] Date of Birth: 1960-06-23  Subjective/Objective:    obs for r/o TIA. Pt from home with husband, ind at baseline. Has PCP, transportation and insurance with drug coverage.                Action/Plan: DC home with [facility] referral. Pt has used [facility] in th epast and would like to use them again. Aware HH has 48 hrs to make first visit. Juliann Pulse, [facility] rep, aware of referral.   Expected Discharge Date:    05/02/2017              Expected Discharge Plan:  [place]  In-House Referral:  NA  Discharge planning Services  CM Consult  Post Acute Care Choice:  Home Health Choice offered to:  Patient  HH Arranged:  PT [place]:  [place]  Status of Service:  Completed, signed off  If discussed at [place] of Stay Meetings, dates discussed:    Additional Comments:  Sherald Barge, RN 05/02/2017, 10:55 AM
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
## 2017-05-02 NOTE — Procedures (Signed)
Debbie A. Merlene Laughter, MD     [email or web address]           HISTORY: The patient is a 57 year old who presents with confusional episode worrisome for seizures.  MEDICATIONS: Scheduled Meds: . aspirin  300 mg Rectal Daily   Or  . aspirin  325 mg Oral Daily  . donepezil  10 mg Oral QHS  . DULoxetine  60 mg Oral BID  . ferrous sulfate  325 mg Oral Q1200  . folic acid  1 mg Oral Daily  . gabapentin  600 mg Oral TID  . levothyroxine  125 mcg Oral QAC breakfast  . methocarbamol  750 mg Oral TID  . methylphenidate  5 mg Oral TID WC  . mirabegron ER  50 mg Oral Daily  . multivitamin with minerals  1 tablet Oral BID  . mupirocin ointment  1 application Topical TID  . pantoprazole  80 mg Oral Daily  . rivaroxaban  20 mg Oral Q supper  . senna-docusate  1 tablet Oral QHS  . topiramate  25 mg Oral BID  . vitamin B-12  5,000 mcg Oral Q1200  . vitamin C  500 mg Oral Q1200  . [START ON 05/06/2017] Vitamin D (Ergocalciferol)  50,000 Units Oral Q7 days   Continuous Infusions: . sodium chloride     PRN Meds:.acetaminophen **OR** acetaminophen (TYLENOL) oral liquid 160 mg/5 mL **OR** acetaminophen, bacitracin, baclofen, hydrOXYzine, ondansetron, oxyCODONE, polyethylene glycol, traMADol, traZODone  Prior to Admission medications   Medication Sig Start Date End Date Taking? Authorizing Provider  aspirin EC 81 MG tablet Take 81 mg by mouth daily. Take from 3/25-3/31   Yes [provider]  bacitracin-polymyxin b (POLYSPORIN) ointment Apply 1 application topically 3 (three) times daily as needed (burns).   Yes [provider]  Cyanocobalamin (B-12) 5000 MCG SUBL Place 5,000 mcg under the tongue daily at 12 noon.   Yes [provider]  diclofenac sodium (VOLTAREN) 1 % GEL Apply 4 g topically 4 (four) times daily as needed (PAIN).    Yes [provider]  donepezil (ARICEPT) 10 MG tablet Take 1 tablet (10 mg total) by mouth at bedtime. 04/30/17   Yes Debbie Ducking, MD  DULoxetine (CYMBALTA) 60 MG capsule Take 60 mg by mouth 2 (two) times daily. 01/17/15  Yes [provider]  fluconazole (DIFLUCAN) 150 MG tablet Take 150 mg by mouth daily as needed (thrush). Take for 7 days   Yes [provider]  folic acid (FOLVITE) 626 MCG tablet Take 400 mcg by mouth daily.   Yes [provider]  gabapentin (NEURONTIN) 600 MG tablet Take 600 mg by mouth 3 (three) times daily.   Yes [provider]  Javier Docker Oil 300 MG CAPS Take 300 mg by mouth daily.   Yes [provider]  Pineda-Methylfolate-B12-B6-B2 (CEREFOLIN) 06-22-48-5 MG TABS Take 1 tablet by mouth daily. 02/28/17  Yes Debbie Ducking, MD  levothyroxine (SYNTHROID, LEVOTHROID) 125 MCG tablet Take 125 mcg by mouth daily before breakfast.   Yes [provider]  Menthol, Topical Analgesic, (BIOFREEZE EX) Apply 1 application topically 3 (three) times daily as needed (muscle pain).   Yes [provider]  Menthol, Topical Analgesic, (GOLD BOND FOOT SPRAY MAX ST EX) Apply 1 application topically daily as needed (foot irritation).   Yes [provider]  metFORMIN (GLUCOPHAGE) 500 MG tablet Take 500 mg by mouth daily with breakfast.   Yes [provider]  methocarbamol (ROBAXIN) 750 MG tablet  Take 1 tablet by mouth 3 times daily. 04/02/17  Yes Debbie, [name], MD  methylphenidate (RITALIN) 20 MG tablet Take 10 mg by mouth 5 (five) times daily. Between 0800 and 1600   Yes [provider]  mirabegron ER (MYRBETRIQ) 50 MG TB24 tablet Take 50 mg by mouth daily.   Yes [provider]  Multiple Minerals-Vitamins (CAL-MAG-ZINC-D PO) Take 1-2 tablets by mouth See admin instructions. Take 2 tablet at 1200 and 1 tablet at 1600  Calcium 1000 mg Vit D 600 iu Magnesium 400 mg Zinc 15 mg   Yes [provider]  Multiple Vitamin (MULTIVITAMIN WITH MINERALS) TABS tablet Take 1 tablet by mouth 2 (two) times daily. One a  day petites   Yes [provider]  mupirocin ointment (BACTROBAN) 2 % Apply 1 application topically 3 (three) times daily as needed (with dressing change).    Yes [provider]  NARCAN 4 MG/0.1ML LIQD nasal spray kit Place 1 spray into the nose as needed (over dose).  11/14/16  Yes [provider]  Neomy-Bacit-Polymyx-Pramoxine (NEOSPORIN + PAIN RELIEF MAX ST) 1 % OINT Apply 1 application topically as needed (wound care).   Yes [provider]  omeprazole (PRILOSEC) 40 MG capsule Take 1 capsule by mouth twice daily. 04/02/17  Yes Debbie, Terri L, NP  ondansetron (ZOFRAN-ODT) 4 MG disintegrating tablet Take 4 mg by mouth every 6 (six) hours as needed for nausea.  03/02/15  Yes [provider]  Oxycodone HCl 10 MG TABS Take 1 tablet (10 mg total) by mouth every 4 (four) hours as needed. 04/18/17  Yes Debbie Hakim, MD  polyethylene glycol powder (MIRALAX) powder Take 17 g by mouth 2 (two) times daily as needed for moderate constipation.    Yes [provider]  Polyvinyl Alcohol-Povidone (REFRESH OP) Place 1-2 drops into both eyes 3 (three) times daily as needed (dry eyes).   Yes [provider]  pseudoephedrine (SUDAFED) 30 MG tablet Take 30-60 mg by mouth every 4 (four) hours as needed for congestion.   Yes [provider]  rivaroxaban (XARELTO) 20 MG TABS tablet Take 1 tablet (20 mg total) by mouth daily with supper. Patient taking differently: Take 20 mg by mouth at bedtime.  03/25/15  Yes Debbie Minion, MD  SUMAtriptan (IMITREX) 50 MG tablet Take 1 tablet (50 mg total) by mouth every 2 (two) hours as needed for migraine (ongoing headache). Maximum daily dose - 2 tablets 03/06/17  Yes Debbie Chapel, MD  topiramate (TOPAMAX) 25 MG tablet Take 25 mg by mouth 2 (two) times daily.   Yes [provider]  traMADol (ULTRAM) 50 MG tablet Take 50 mg by mouth 4 (four) times daily as needed (FOR BREAK THROUGH PAIN).  10/13/15  Yes  [provider]  traZODone (DESYREL) 100 MG tablet Take 50-200 mg by mouth at bedtime as needed for sleep (depends on insomnia).    Yes [provider]  UNABLE TO FIND Apply 1 application topically 2 (two) times daily as needed (pain). Med Name: C-Anti-Inflammatory Cream  Diclofenac 3% / Baclofen 2% / Cyclobenzaprine 2% / Lidocaine 2%   Yes [provider]  vitamin C (ASCORBIC ACID) 500 MG tablet Take 500 mg by mouth daily at 12 noon.    Yes [provider]  Vitamin D, Ergocalciferol, (DRISDOL) 50000 UNITS CAPS Take 50,000 Units by mouth every 7 (seven) days. Mondays   Yes [provider]      ANALYSIS: A 16 channel recording  using standard 10 20 measurements is conducted for 22 minutes. There is a well-formed posterior dominant rhythm of 8 Hz which attenuates with eye opening. There is beta activity observed in he frontal areas. Awake and sleep architecture is observed. There is prominent K complexes and sleep spindles seen throughout most of the recording consistent with stage II non-REM sleep. Physical examination is carried out without abnormal changes in the background activity. There is a single sharp wave activity that phase reverses at T5. There are several episodes of frontal intermittent rhythmic delta activity.   IMPRESSION: 1. 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 Given the findings and the patient's history, the patient's Topamax will be increased to 50 mg twice a day. Additionally, we will discontinue the Ultram as this is known to lower the seizure threshold. 2. 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      Debbie Pineda A. Merlene Pineda, M.D.  Diplomate, Tax adviser of Psychiatry and Neurology ( Neurology).

## 2017-05-02 NOTE — Progress Notes (Signed)
*  PRELIMINARY RESULTS* Echocardiogram 2D Echocardiogram has been performed.  Leavy Cella 05/02/2017, 12:18 PM

## 2017-05-02 NOTE — Consult Note (Signed)
Reason for Consult:elevated liver enzymes Referring Physician:   ANJANETTE Pineda is an 57 y.o. female.  HPI: Admitted thru the ED yesterday. She was returning from [facility] yesterday and her driving apparently became erratic.  After getting home, husband took her to the ED.  She underwent a CT head which revealed: IMPRESSION: 1. No emergent large vessel occlusion or acute infarct/penumbra by CT perfusion. 2. Remote left PCA distribution infarct. 3. No noted atheromatous changes or stenosis. 4. 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 Recommend follow-up in this young patient. Noted to have elevated liver enzymes. They are coming down. She underwent an US abdomen which revealed 1. No acute findings. 2. Status post cholecystectomy. Chronic dilation of common bile duct. 3. Liver appearance is unremarkable. Husband states she was diagnosed with diabetes in the last few weeks and was started on Metformin. Husband is present in the room and is answering most of questions. Patient is lethargic from receiving pain medication for her back.  She denies any abdominal pain.  There has been no change in her appetite. Has a BM daily. No melena or BRRB. Maintained on Xarelto. Has a hx of thrombophlebitis.     Hepatic Function Latest Ref Rng & Units 05/02/2017 05/01/2017 04/11/2017  Total Protein 6.5 - 8.1 g/dL 5.8(L) 6.9 7.2  Albumin 3.5 - 5.0 g/dL 3.4(L) 4.0 4.4  AST 15 - 41 U/L 104(H) 407(H) 47(H)  ALT 14 - 54 U/L 149(H) 221(H) 51  Alk Phosphatase 38 - 126 U/L 120 158(H) 112  Total Bilirubin 0.3 - 1.2 mg/dL 0.3 0.5 0.7  Bilirubin, Direct 0.1 - 0.5 mg/dL - - <0.1(L)         Past Medical History:  Diagnosis Date  . ADD (attention deficit disorder)    takes Adderall daily  . Arthritis    "all over"  . Cerebral infarction ([place]) 10/30/2011  . Cerebrovascular disease 08/14/2016  . Chronic back pain    "all over"  . Chronic low back pain 01/11/2016  .  Complication of anesthesia    tends to have hypotension when NPO and post-anesthesia  . Constipation    takes stool softener daily  . Degenerative disk disease   . Degenerative joint disease   . Depression    takes Cymbalta daily for pain per pt  . Diabetes mellitus without complication ([place])   . DVT (deep venous thrombosis) ([facility])    RLE  . Family history of adverse reaction to anesthesia    a family member woke up during surgery; "think it was my mom"  . Fibromyalgia   . Generalized osteoarthritis of multiple sites 11/04/2013  . GERD (gastroesophageal reflux disease)   . Heart palpitations 12/14/2013  . History of blood clots    superficial  . Hypoglycemia   . Hypothyroid    takes Synthroid daily  . Incomplete emptying of bladder   . Insomnia    takes Trazodone nightly  . Iron deficiency anemia    takes Ferrous Sulfate daily  . Joint pain   . Joint swelling    knees and ankles  . Memory disorder 08/14/2016  . Morbid obesity ([place])   . Nausea    takes Zofran as needed.Seeing GI doc  . Neck pain 05/25/2016  . OSA on CPAP    tested more than 5 yrs ago.    . Osteoarthritis   . PFO (patent foramen ovale)   . Primary osteoarthritis of both feet 05/29/2016   Right bunionectomy August 2017 by  Dr. Sharol Given  . Scoliosis   . Sleep apnea   . Stroke [facility]) "several"   right foot weakness; memory issues, black spot right visual field since" (03/23/2015)  . Thrombophlebitis   . Trochanteric bursitis of both hips 05/25/2016  . Unilateral primary osteoarthritis, right knee 05/29/2016  . Urinary urgency 04/19/2016  . Vein disorder 11/29/2010    Past Surgical History:  Procedure Laterality Date  . BUNIONECTOMY Right 08/2015  . CARDIAC CATHETERIZATION     2008.  "it was fine" (not sure why she had it done, and doesn't know where)  . COLONOSCOPY N/A 03/25/2013   Procedure: COLONOSCOPY;  Surgeon: Rogene Houston, MD;  [facility];  Service: Endoscopy;  Laterality: N/A;  930  .  ESOPHAGOGASTRODUODENOSCOPY    . EXPLORATORY LAPAROTOMY     "took fallopian tubes out"  . JOINT REPLACEMENT     bil knee   . KNEE ARTHROPLASTY    . KNEE ARTHROSCOPY Left   . KNEE ARTHROSCOPY W/ ACL RECONSTRUCTION Right    "added pins"  . LAPAROSCOPIC CHOLECYSTECTOMY  ~ 2001  . 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 SPINAL CORD STIMULATOR INSERTION N/A 04/18/2017   Procedure: LUMBAR SPINAL CORD STIMULATOR INSERTION;  Surgeon: Clydell Hakim, MD;  Location: [place];  Service: Neurosurgery;  Laterality: N/A;  LUMBAR SPINAL CORD STIMULATOR INSERTION  . TOTAL KNEE ARTHROPLASTY Left 03/23/2015   Procedure: TOTAL KNEE ARTHROPLASTY;  Surgeon: Newt Minion, MD;  Location: [place];  Service: Orthopedics;  Laterality: Left;  . TOTAL KNEE ARTHROPLASTY Right 08/15/2016   Procedure: RIGHT TOTAL KNEE ARTHROPLASTY, REMOVAL ACL SCREWS;  Surgeon: Newt Minion, MD;  Location: [place];  Service: Orthopedics;  Laterality: Right;  . TOTAL KNEE ARTHROPLASTY WITH HARDWARE REMOVAL Right   . VAGINAL HYSTERECTOMY    . VARICOSE VEIN SURGERY Right X 2    Family History  Problem Relation Age of Onset  . Heart disease Father   . Cancer Father   . Cancer Mother        skin cancer   . Heart disease Brother   . Cancer Brother   . Diabetes Brother   . Stroke Brother   . Heart disease Sister   . Cancer Maternal Grandfather   . Hypothyroidism Daughter   . Colon cancer Neg Hx     Social History:  reports that she quit smoking about 26 years ago. Her smoking use included cigarettes. She has a 6.00 pack-year smoking history. She has never used smokeless tobacco. She reports that she does not drink alcohol or use drugs.  Allergies:  Allergies  Allergen Reactions  . Lyrica [Pregabalin] Other (See Comments)    SOB, lower extremity edema and weight gain  . Flexeril [Cyclobenzaprine Hcl] Other (See Comments)    Numbness of extremities  . Morphine And Related Itching    Upper torso  . Sulfamethoxazole-Trimethoprim  Itching and Rash    Bactrim  . Belsomra [Suvorexant] Other (See Comments)    unknown  . Tape Itching and Rash    Please use "paper" tape Rash if left on longer than 24 hrs    Medications: I have reviewed the patient's current medications.  Results for orders placed or performed during the hospital encounter of 05/01/17 (from the past 48 hour(s))  CBG monitoring, ED     Status: None   Collection Time: 05/01/17 12:20 PM  Result Value Ref Range   Glucose-Capillary 92 65 - 99 mg/dL  Urine rapid drug screen (hosp  performed)not at [facility]     Status: None   Collection Time: 05/01/17 12:24 PM  Result Value Ref Range   Opiates NONE DETECTED NONE DETECTED   Cocaine NONE DETECTED NONE DETECTED   Benzodiazepines NONE DETECTED NONE DETECTED   Amphetamines NONE DETECTED NONE DETECTED   Tetrahydrocannabinol NONE DETECTED NONE DETECTED   Barbiturates NONE DETECTED NONE DETECTED    Comment: (NOTE) DRUG SCREEN FOR MEDICAL PURPOSES ONLY.  IF CONFIRMATION IS NEEDED FOR ANY PURPOSE, NOTIFY LAB WITHIN 5 DAYS. LOWEST DETECTABLE LIMITS FOR URINE DRUG SCREEN Drug Class                     Cutoff (ng/mL) Amphetamine and metabolites    1000 Barbiturate and metabolites    200 Benzodiazepine                 283 Tricyclics and metabolites     300 Opiates and metabolites        300 Cocaine and metabolites        300 THC                            50 Performed at [facility] [place], [place] [address]   Urinalysis, Routine w reflex microscopic     Status: Abnormal   Collection Time: 05/01/17 12:24 PM  Result Value Ref Range   Color, Urine STRAW (A) YELLOW   APPearance CLEAR CLEAR   Specific Gravity, Urine 1.011 1.005 - 1.030   pH 8.0 5.0 - 8.0   Glucose, UA NEGATIVE NEGATIVE mg/dL   Hgb urine dipstick SMALL (A) NEGATIVE   Bilirubin Urine NEGATIVE NEGATIVE   Ketones, ur NEGATIVE NEGATIVE mg/dL   Protein, ur NEGATIVE NEGATIVE mg/dL   Nitrite NEGATIVE NEGATIVE   Leukocytes, UA  NEGATIVE NEGATIVE   RBC / HPF 0-5 0 - 5 RBC/hpf   WBC, UA 0-5 0 - 5 WBC/hpf   Bacteria, UA NONE SEEN NONE SEEN   Squamous Epithelial / LPF 0-5 (A) NONE SEEN    Comment: Performed at [facility], [address], [place], [place] [address]  Ethanol     Status: None   Collection Time: 05/01/17 12:32 PM  Result Value Ref Range   Alcohol, Ethyl (B) <10 <10 mg/dL    Comment:        LOWEST DETECTABLE LIMIT FOR SERUM ALCOHOL IS 10 mg/dL FOR MEDICAL PURPOSES ONLY Performed at [facility], [address] [place], [place] [address]   Protime-INR     Status: Abnormal   Collection Time: 05/01/17 12:32 PM  Result Value Ref Range   Prothrombin Time 15.6 (H) 11.4 - 15.2 seconds   INR 1.25     Comment: Performed at [facility], [address] [place], [place] [address]  APTT     Status: Abnormal   Collection Time: 05/01/17 12:32 PM  Result Value Ref Range   aPTT 45 (H) 24 - 36 seconds    Comment:        IF BASELINE aPTT IS ELEVATED, SUGGEST PATIENT RISK ASSESSMENT BE USED TO DETERMINE APPROPRIATE ANTICOAGULANT THERAPY. Performed at [facility], [address], [place], [place] [address]   CBC     Status: None   Collection Time: 05/01/17 12:32 PM  Result Value Ref Range   WBC 8.5 4.0 - 10.5 K/uL   RBC 4.07 3.87 - 5.11 MIL/uL   Hemoglobin 13.0 12.0 - 15.0 g/dL  HCT 40.2 36.0 - 46.0 %   MCV 98.8 78.0 - 100.0 fL   MCH 31.9 26.0 - 34.0 pg   MCHC 32.3 30.0 - 36.0 g/dL   RDW 12.2 11.5 - 15.5 %   Platelets 230 150 - 400 K/uL    Comment: Performed at [facility], [address] [place], [place] [address]  Differential     Status: None   Collection Time: 05/01/17 12:32 PM  Result Value Ref Range   Neutrophils Relative % 57 %   Neutro Abs 4.8 1.7 - 7.7 K/uL   Lymphocytes Relative 31 %   Lymphs Abs 2.6 0.7 - 4.0 K/uL   Monocytes Relative 9 %   Monocytes Absolute 0.8 0.1 - 1.0 K/uL   Eosinophils Relative 2 %   Eosinophils Absolute 0.2 0.0 - 0.7 K/uL   Basophils Relative 1 %    Basophils Absolute 0.1 0.0 - 0.1 K/uL    Comment: Performed at [facility], [address] [place], [place] [address]  Comprehensive metabolic panel     Status: Abnormal   Collection Time: 05/01/17 12:32 PM  Result Value Ref Range   Sodium 139 135 - 145 mmol/L   Potassium 3.7 3.5 - 5.1 mmol/L   Chloride 104 101 - 111 mmol/L   CO2 26 22 - 32 mmol/L   Glucose, Bld 65 65 - 99 mg/dL   BUN 18 6 - 20 mg/dL   Creatinine, Ser 0.83 0.44 - 1.00 mg/dL   Calcium 9.2 8.9 - 10.3 mg/dL   Total Protein 6.9 6.5 - 8.1 g/dL   Albumin 4.0 3.5 - 5.0 g/dL   AST 407 (H) 15 - 41 U/L   ALT 221 (H) 14 - 54 U/L   Alkaline Phosphatase 158 (H) 38 - 126 U/L   Total Bilirubin 0.5 0.3 - 1.2 mg/dL   GFR calc non Af Amer >60 >60 mL/min   GFR calc Af Amer >60 >60 mL/min    Comment: (NOTE) The eGFR has been calculated using the CKD EPI equation. This calculation has not been validated in all clinical situations. eGFR's persistently <60 mL/min signify possible Chronic Kidney Disease.    Anion gap 9 5 - 15    Comment: Performed at [facility], [address], [place], [place] [address]  I-stat troponin, ED (not at [facility])     Status: None   Collection Time: 05/01/17 12:46 PM  Result Value Ref Range   Troponin i, poc 0.00 0.00 - 0.08 ng/mL   Comment 3            Comment: Due to the release kinetics of cTnI, a negative result within the first hours of the onset of symptoms does not rule out myocardial infarction with certainty. If myocardial infarction is still suspected, repeat the test at appropriate intervals.   I-Stat Chem 8, ED  (not at [facility])     Status: Abnormal   Collection Time: 05/01/17 12:48 PM  Result Value Ref Range   Sodium 140 135 - 145 mmol/L   Potassium 3.6 3.5 - 5.1 mmol/L   Chloride 101 101 - 111 mmol/L   BUN 17 6 - 20 mg/dL   Creatinine, Ser 1.00 0.44 - 1.00 mg/dL   Glucose, Bld 60 (L) 65 - 99 mg/dL   Calcium, Ion 1.19 1.15 - 1.40 mmol/L   TCO2 27 22 - 32 mmol/L   Hemoglobin  12.9 12.0 - 15.0 g/dL   HCT 38.0 36.0 - 46.0 %  I-Stat beta  hCG blood, ED     Status: None   Collection Time: 05/01/17 12:51 PM  Result Value Ref Range   I-stat hCG, quantitative <5.0 <5 mIU/mL   Comment 3            Comment:   GEST. AGE      CONC.  (mIU/mL)   <=1 WEEK        5 - 50     2 WEEKS       50 - 500     3 WEEKS       100 - 10,000     4 WEEKS     1,000 - 30,000        FEMALE AND NON-PREGNANT FEMALE:     LESS THAN 5 mIU/mL   CBG monitoring, ED     Status: None   Collection Time: 05/01/17  3:03 PM  Result Value Ref Range   Glucose-Capillary 98 65 - 99 mg/dL  Urinalysis, Complete w Microscopic     Status: None   Collection Time: 05/01/17  3:29 PM  Result Value Ref Range   Color, Urine YELLOW YELLOW   APPearance CLEAR CLEAR   Specific Gravity, Urine 1.010 1.005 - 1.030   pH 7.0 5.0 - 8.0   Glucose, UA NEGATIVE NEGATIVE mg/dL   Hgb urine dipstick NEGATIVE NEGATIVE   Bilirubin Urine NEGATIVE NEGATIVE   Ketones, ur NEGATIVE NEGATIVE mg/dL   Protein, ur NEGATIVE NEGATIVE mg/dL   Nitrite NEGATIVE NEGATIVE   Leukocytes, UA NEGATIVE NEGATIVE   Squamous Epithelial / LPF NONE SEEN NONE SEEN   WBC, UA NONE SEEN 0 - 5 WBC/hpf   RBC / HPF NONE SEEN 0 - 5 RBC/hpf   Bacteria, UA NONE SEEN NONE SEEN    Comment: Performed at [facility], [address], [place], [place] [address]  TSH     Status: None   Collection Time: 05/01/17  3:40 PM  Result Value Ref Range   TSH 4.289 0.350 - 4.500 uIU/mL    Comment: Performed by a 3rd Generation assay with a functional sensitivity of <=0.01 uIU/mL. Performed at [facility], [address] [place], [place] [address]   Hepatitis B surface antigen     Status: None   Collection Time: 05/01/17  3:40 PM  Result Value Ref Range   Hepatitis B Surface Ag Negative Negative    Comment: (NOTE) Performed At: [facility] [place], [place] [id number] Rush Farmer MD [id number] Performed at [facility], [address], [place], [place] [address]   Hepatitis C antibody     Status: None   Collection Time: 05/01/17  3:40 PM  Result Value Ref Range   HCV Ab <0.1 0.0 - 0.9 s/co ratio    Comment: (NOTE)                                  Negative:     < 0.8                             Indeterminate: 0.8 - 0.9                                  Positive:     > 0.9 The CDC recommends that a positive HCV antibody result be followed up  with a HCV Nucleic Acid Amplification test (790240). Performed At: [facility] [place], [place] [id number] Rush Farmer MD [id number] Performed at [facility], [address], [place], [place] [address]   Glucose, capillary     Status: None   Collection Time: 05/01/17  8:52 PM  Result Value Ref Range   Glucose-Capillary 72 65 - 99 mg/dL   Comment 1 Notify RN    Comment 2 Document in Chart   Glucose, capillary     Status: Abnormal   Collection Time: 05/01/17 11:55 PM  Result Value Ref Range   Glucose-Capillary 236 (H) 65 - 99 mg/dL  Lipid panel     Status: None   Collection Time: 05/02/17  4:19 AM  Result Value Ref Range   Cholesterol 151 0 - 200 mg/dL   Triglycerides 54 <150 mg/dL   HDL 74 >40 mg/dL   Total CHOL/HDL Ratio 2.0 RATIO   VLDL 11 0 - 40 mg/dL   LDL Cholesterol 66 0 - 99 mg/dL    Comment:        Total Cholesterol/HDL:CHD Risk Coronary Heart Disease Risk Table                     Men   Women  1/2 Average Risk   3.4   3.3  Average Risk       5.0   4.4  2 X Average Risk   9.6   7.1  3 X Average Risk  23.4   11.0        Use the calculated Patient Ratio above and the CHD Risk Table to determine the patient's CHD Risk.        ATP III CLASSIFICATION (LDL):  <100     mg/dL   Optimal  100-129  mg/dL   Near or Above                    Optimal  130-159  mg/dL   Borderline  160-189  mg/dL   High  >190     mg/dL   Very High Performed at [facility] [place], [place] [address]   Comprehensive metabolic panel      Status: Abnormal   Collection Time: 05/02/17  4:19 AM  Result Value Ref Range   Sodium 142 135 - 145 mmol/L   Potassium 4.1 3.5 - 5.1 mmol/L   Chloride 108 101 - 111 mmol/L   CO2 26 22 - 32 mmol/L   Glucose, Bld 89 65 - 99 mg/dL   BUN 16 6 - 20 mg/dL   Creatinine, Ser 0.79 0.44 - 1.00 mg/dL   Calcium 8.8 (L) 8.9 - 10.3 mg/dL   Total Protein 5.8 (L) 6.5 - 8.1 g/dL   Albumin 3.4 (L) 3.5 - 5.0 g/dL   AST 104 (H) 15 - 41 U/L   ALT 149 (H) 14 - 54 U/L   Alkaline Phosphatase 120 38 - 126 U/L   Total Bilirubin 0.3 0.3 - 1.2 mg/dL   GFR calc non Af Amer >60 >60 mL/min   GFR calc Af Amer >60 >60 mL/min    Comment: (NOTE) The eGFR has been calculated using the CKD EPI equation. This calculation has not been validated in all clinical situations. eGFR's persistently <60 mL/min signify possible Chronic Kidney Disease.    Anion gap 8 5 - 15    Comment: Performed at [facility], [address] [place], [place] [address]  Glucose, capillary  Status: None   Collection Time: 05/02/17  7:20 AM  Result Value Ref Range   Glucose-Capillary 96 65 - 99 mg/dL  Acetaminophen level     Status: Abnormal   Collection Time: 05/02/17  8:47 AM  Result Value Ref Range   Acetaminophen (Tylenol), Serum <10 (L) 10 - 30 ug/mL    Comment:        THERAPEUTIC CONCENTRATIONS VARY SIGNIFICANTLY. A RANGE OF 10-30 ug/mL MAY BE AN EFFECTIVE CONCENTRATION FOR MANY PATIENTS. HOWEVER, SOME ARE BEST TREATED AT CONCENTRATIONS OUTSIDE THIS RANGE. ACETAMINOPHEN CONCENTRATIONS >150 ug/mL AT 4 HOURS AFTER INGESTION AND >50 ug/mL AT 12 HOURS AFTER INGESTION ARE OFTEN ASSOCIATED WITH TOXIC REACTIONS. Performed at [facility], [address], [place], [place] [address]   Glucose, capillary     Status: Abnormal   Collection Time: 05/02/17 11:27 AM  Result Value Ref Range   Glucose-Capillary 170 (H) 65 - 99 mg/dL      ROS Blood pressure 100/60, pulse (!) 58, temperature 97.6 F (36.4 C), temperature source  Oral, resp. rate 18, height _0  (1.651 m), weight 213 lb 13.5 oz (97 kg), SpO2 95 %. Physical Exam  Alert but appears sedated. . Skin warm and dry. Oral mucosa is moist.   . Sclera anicteric, conjunctivae is pink. Thyroid not enlarged. No cervical lymphadenopathy. Lungs clear. Heart regular rate and rhythm.  Abdomen is soft. Bowel sounds are positive. No hepatomegaly. No abdominal masses felt. No tenderness.  No edema to lower extremities.    Assessment/Plan: Elevated liver enzymes. Numbers are tending down. Has not taken any Tylenol in over a month. The only new medication per husband is Metformin. I will discuss with Dr.Rehman.   Debbie Pineda 05/02/2017, 11:47 AM

## 2017-05-02 NOTE — Consult Note (Signed)
[place] A. Merlene Laughter, MD     [email or web address]          Debbie Pineda is an 57 y.o. female.   ASSESSMENT/PLAN: 1. Acute confusional spell associated with some word finding difficulties and headaches: The differential diagnosis includes: left hemispheric ischemic stroke, complex partial seizures and migraine with aura. 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 We will obtain an EEG. We will not treat patient for seizures at this time. Continue with Xarelto for secondary stroke prevention and DVT prophylaxis. Consider imaging with MRI if this can be arranged. Follow-up with additional workup pending including carotid duplex Doppler.     2. Anterior communicating aneurysm: Consider referral for coiling. This can be done in an outpatient setting.   The patient is a 57 year old right-handed white female who presents with the acute onset of what appears to be vertiginous symptoms while driving, confusion and the word finding difficulties. The episode lasted for several hours. This was associated with severe frontal headaches. The husband reports that she seemed to have some word finding difficulties this morning although the patient seemed not to concur with this. 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 She is on anticoagulation for this infarct because of having a PFO and also having a history of factor V Leiden mutation. The patient reports that with the current symptoms/episode she had no focal weakness although she reports being fatigued generally. She denies any focal numbness. She does not report dysphagia or dysarthria. The review of systems otherwise negative.  GENERAL: This is a pleasant female who is in no acute distress.  HEENT: Supple. Atraumatic normocephalic.    ABDOMEN: soft  EXTREMITIES: No edema   BACK: Normal.  SKIN: Normal by inspection.    MENTAL STATUS: Alert and oriented - Including orientation to her age and the month. Speech, language and cognition are generally intact. Judgment and insight normal.   CRANIAL NERVES: Pupils are equal, round and reactive to light and accommodation; extra ocular movements are full, there is no significant nystagmus; visual fields shows superior right quadrantanopia; upper and lower facial muscles are normal in strength and symmetric, there is no flattening of the nasolabial folds; tongue is midline; uvula is midline; shoulder elevation is normal.  MOTOR: Normal tone, bulk and strength; there is mild pronator drift of the RUE but no clear downward drift. There is no drift of the other extremities.  COORDINATION: Left finger to nose is normal, right finger to nose is normal, No rest tremor; no intention tremor; no postural tremor; no bradykinesia.  REFLEXES: Deep tendon reflexes are symmetrical and normal.    SENSATION: Normal to light touch. There is no extinction on double simultaneous stimulation.    NIH stroke scale 1.        NEURO NOTE -2013 1. The patient's constellation of symptoms including a right superior Homonymous quadrantanopia, Left-sided headaches and aphasia/word finding problem seems to indicate a left temporal process. I suspect that the most likely etiology is from an acute infarct not seen on CT scan. Other potential etiologies includes complex migraine although she does not have a history of migraine and complex partial seizures. Risk factors: History of PFO off aspirin,Obesity and a possible history of stroke in the past. I do not believe that the patient should be on heparin as this has not been  shown to be beneficial in stroke prevention. I will therefore discontinue this and continue the aspirin. She does not represent an aspirin failure as she was off the medication for the  past 7 days. A echo seems appropriate. Believe this has been ordered with a bubble study. A brain MRI and MRA are pending. EEG will be obtained. All the labs includes homocysteine and vitamin B12 level.Patient may be a candidate for the PFO study this been done in by Dr. Precious Haws in [place].  Blood pressure 100/60, pulse (!) 58, temperature 97.6 F (36.4 C), temperature source Oral, resp. rate 18, height '5\' 5"'  (1.651 m), weight 213 lb 13.5 oz (97 kg), SpO2 95 %.  Past Medical History:  Diagnosis Date  . ADD (attention deficit disorder)    takes Adderall daily  . Arthritis    "all over"  . Cerebral infarction ([place]) 10/30/2011  . Cerebrovascular disease 08/14/2016  . Chronic back pain    "all over"  . Chronic low back pain 01/11/2016  . Complication of anesthesia    tends to have hypotension when NPO and post-anesthesia  . Constipation    takes stool softener daily  . Degenerative disk disease   . Degenerative joint disease   . Depression    takes Cymbalta daily for pain per pt  . Diabetes mellitus without complication ([place])   . DVT (deep venous thrombosis) ([facility])    RLE  . Family history of adverse reaction to anesthesia    a family member woke up during surgery; "think it was my mom"  . Fibromyalgia   . Generalized osteoarthritis of multiple sites 11/04/2013  . GERD (gastroesophageal reflux disease)   . Heart palpitations 12/14/2013  . History of blood clots    superficial  . Hypoglycemia   . Hypothyroid    takes Synthroid daily  . Incomplete emptying of bladder   . Insomnia    takes Trazodone nightly  . Iron deficiency anemia    takes Ferrous Sulfate daily  . Joint pain   . Joint swelling    knees and ankles  . Memory disorder 08/14/2016  . Morbid obesity ([place])   . Nausea    takes Zofran as needed.Seeing GI doc  . Neck pain 05/25/2016  . OSA on CPAP    tested more than 5 yrs ago.    . Osteoarthritis   . PFO (patent foramen ovale)   . Primary osteoarthritis of both  feet 05/29/2016   Right bunionectomy August 2017 by Dr. Sharol Given  . Scoliosis   . Sleep apnea   . Stroke [facility]) "several"   right foot weakness; memory issues, black spot right visual field since" (03/23/2015)  . Thrombophlebitis   . Trochanteric bursitis of both hips 05/25/2016  . Unilateral primary osteoarthritis, right knee 05/29/2016  . Urinary urgency 04/19/2016  . Vein disorder 11/29/2010    Past Surgical History:  Procedure Laterality Date  . BUNIONECTOMY Right 08/2015  . CARDIAC CATHETERIZATION     2008.  "it was fine" (not sure why she had it done, and doesn't know where)  . COLONOSCOPY N/A 03/25/2013   Procedure: COLONOSCOPY;  Surgeon: Rogene Houston, MD;  [facility];  Service: Endoscopy;  Laterality: N/A;  930  . ESOPHAGOGASTRODUODENOSCOPY    . EXPLORATORY LAPAROTOMY     "took fallopian tubes out"  . JOINT REPLACEMENT     bil knee   . KNEE ARTHROPLASTY    . KNEE ARTHROSCOPY Left   . KNEE ARTHROSCOPY W/  ACL RECONSTRUCTION Right    "added pins"  . LAPAROSCOPIC CHOLECYSTECTOMY  ~ 2001  . 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 SPINAL CORD STIMULATOR INSERTION N/A 04/18/2017   Procedure: LUMBAR SPINAL CORD STIMULATOR INSERTION;  Surgeon: Clydell Hakim, MD;  Location: [place];  Service: Neurosurgery;  Laterality: N/A;  LUMBAR SPINAL CORD STIMULATOR INSERTION  . TOTAL KNEE ARTHROPLASTY Left 03/23/2015   Procedure: TOTAL KNEE ARTHROPLASTY;  Surgeon: Newt Minion, MD;  Location: [place];  Service: Orthopedics;  Laterality: Left;  . TOTAL KNEE ARTHROPLASTY Right 08/15/2016   Procedure: RIGHT TOTAL KNEE ARTHROPLASTY, REMOVAL ACL SCREWS;  Surgeon: Newt Minion, MD;  Location: [place];  Service: Orthopedics;  Laterality: Right;  . TOTAL KNEE ARTHROPLASTY WITH HARDWARE REMOVAL Right   . VAGINAL HYSTERECTOMY    . VARICOSE VEIN SURGERY Right X 2    Family History  Problem Relation Age of Onset  . Heart disease Father   . Cancer Father   . Cancer Mother        skin cancer   . Heart  disease Brother   . Cancer Brother   . Diabetes Brother   . Stroke Brother   . Heart disease Sister   . Cancer Maternal Grandfather   . Hypothyroidism Daughter   . Colon cancer Neg Hx     Social History:  reports that she quit smoking about 26 years ago. Her smoking use included cigarettes. She has a 6.00 pack-year smoking history. She has never used smokeless tobacco. She reports that she does not drink alcohol or use drugs.  Allergies:  Allergies  Allergen Reactions  . Lyrica [Pregabalin] Other (See Comments)    SOB, lower extremity edema and weight gain  . Flexeril [Cyclobenzaprine Hcl] Other (See Comments)    Numbness of extremities  . Morphine And Related Itching    Upper torso  . Sulfamethoxazole-Trimethoprim Itching and Rash    Bactrim  . Belsomra [Suvorexant] Other (See Comments)    unknown  . Tape Itching and Rash    Please use "paper" tape Rash if left on longer than 24 hrs    Medications: Prior to Admission medications   Medication Sig Start Date End Date Taking? Authorizing Provider  aspirin EC 81 MG tablet Take 81 mg by mouth daily. Take from 3/25-3/31   Yes [provider]  bacitracin-polymyxin b (POLYSPORIN) ointment Apply 1 application topically 3 (three) times daily as needed (burns).   Yes [provider]  Cyanocobalamin (B-12) 5000 MCG SUBL Place 5,000 mcg under the tongue daily at 12 noon.   Yes [provider]  diclofenac sodium (VOLTAREN) 1 % GEL Apply 4 g topically 4 (four) times daily as needed (PAIN).    Yes [provider]  donepezil (ARICEPT) 10 MG tablet Take 1 tablet (10 mg total) by mouth at bedtime. 04/30/17  Yes [name], MD  DULoxetine (CYMBALTA) 60 MG capsule Take 60 mg by mouth 2 (two) times daily. 01/17/15  Yes [provider]  fluconazole (DIFLUCAN) 150 MG tablet Take 150 mg by mouth daily as needed (thrush). Take for 7 days   Yes [provider]  folic acid (FOLVITE) 106 MCG  tablet Take 400 mcg by mouth daily.   Yes [provider]  gabapentin (NEURONTIN) 600 MG tablet Take 600 mg by mouth 3 (three) times daily.   Yes [provider]  Javier Docker Oil 300 MG CAPS Take 300 mg by mouth daily.   Yes [provider]  L-Methylfolate-B12-B6-B2 (CEREFOLIN) 06-22-48-5 MG TABS Take 1 tablet by mouth daily. 02/28/17  Yes [name], MD  levothyroxine (SYNTHROID, LEVOTHROID) 125 MCG tablet Take 125 mcg by mouth daily before breakfast.   Yes [provider]  Menthol, Topical Analgesic, (BIOFREEZE EX) Apply 1 application topically 3 (three) times daily as needed (muscle pain).   Yes [provider]  Menthol, Topical Analgesic, (GOLD BOND FOOT SPRAY MAX ST EX) Apply 1 application topically daily as needed (foot irritation).   Yes [provider]  metFORMIN (GLUCOPHAGE) 500 MG tablet Take 500 mg by mouth daily with breakfast.   Yes [provider]  methocarbamol (ROBAXIN) 750 MG tablet Take 1 tablet by mouth 3 times daily. 04/02/17  Yes [name], [name], MD  methylphenidate (RITALIN) 20 MG tablet Take 10 mg by mouth 5 (five) times daily. Between 0800 and 1600   Yes [provider]  mirabegron ER (MYRBETRIQ) 50 MG TB24 tablet Take 50 mg by mouth daily.   Yes [provider]  Multiple Minerals-Vitamins (CAL-MAG-ZINC-D PO) Take 1-2 tablets by mouth See admin instructions. Take 2 tablet at 1200 and 1 tablet at 1600  Calcium 1000 mg Vit D 600 iu Magnesium 400 mg Zinc 15 mg   Yes [provider]  Multiple Vitamin (MULTIVITAMIN WITH MINERALS) TABS tablet Take 1 tablet by mouth 2 (two) times daily. One a day petites   Yes [provider]  mupirocin ointment (BACTROBAN) 2 % Apply 1 application topically 3 (three) times daily as needed (with dressing change).    Yes [provider]  NARCAN 4 MG/0.1ML LIQD nasal spray kit Place 1 spray into the nose as needed (over dose).  11/14/16  Yes  [provider]  Neomy-Bacit-Polymyx-Pramoxine (NEOSPORIN + PAIN RELIEF MAX ST) 1 % OINT Apply 1 application topically as needed (wound care).   Yes [provider]  omeprazole (PRILOSEC) 40 MG capsule Take 1 capsule by mouth twice daily. 04/02/17  Yes Setzer, Terri L, NP  ondansetron (ZOFRAN-ODT) 4 MG disintegrating tablet Take 4 mg by mouth every 6 (six) hours as needed for nausea.  03/02/15  Yes [provider]  Oxycodone HCl 10 MG TABS Take 1 tablet (10 mg total) by mouth every 4 (four) hours as needed. 04/18/17  Yes Clydell Hakim, MD  polyethylene glycol powder (MIRALAX) powder Take 17 g by mouth 2 (two) times daily as needed for moderate constipation.    Yes [provider]  Polyvinyl Alcohol-Povidone (REFRESH OP) Place 1-2 drops into both eyes 3 (three) times daily as needed (dry eyes).   Yes [provider]  pseudoephedrine (SUDAFED) 30 MG tablet Take 30-60 mg by mouth every 4 (four) hours as needed for congestion.   Yes [provider]  rivaroxaban (XARELTO) 20 MG TABS tablet Take 1 tablet (20 mg total) by mouth daily with supper. Patient taking differently: Take 20 mg by mouth at bedtime.  03/25/15  Yes Newt Minion, MD  SUMAtriptan (IMITREX) 50 MG tablet Take 1 tablet (50 mg total) by mouth every 2 (two) hours as needed for migraine (ongoing headache). Maximum daily dose - 2 tablets 03/06/17  Yes Noemi Chapel, MD  topiramate (TOPAMAX) 25 MG tablet Take 25 mg by mouth 2 (two) times daily.   Yes [provider]  traMADol (ULTRAM) 50 MG tablet Take 50 mg by mouth 4 (four) times daily as needed (FOR BREAK THROUGH PAIN).  10/13/15  Yes [provider]  traZODone (DESYREL) 100 MG tablet Take 50-200  mg by mouth at bedtime as needed for sleep (depends on insomnia).    Yes [provider]  UNABLE TO FIND Apply 1 application topically 2 (two) times daily as needed (pain). Med Name: C-Anti-Inflammatory Cream  Diclofenac 3% /  Baclofen 2% / Cyclobenzaprine 2% / Lidocaine 2%   Yes [provider]  vitamin C (ASCORBIC ACID) 500 MG tablet Take 500 mg by mouth daily at 12 noon.    Yes [provider]  Vitamin D, Ergocalciferol, (DRISDOL) 50000 UNITS CAPS Take 50,000 Units by mouth every 7 (seven) days. Mondays   Yes [provider]    Scheduled Meds: . aspirin  300 mg Rectal Daily   Or  . aspirin  325 mg Oral Daily  . donepezil  10 mg Oral QHS  . DULoxetine  60 mg Oral BID  . ferrous sulfate  325 mg Oral Q1200  . folic acid  1 mg Oral Daily  . gabapentin  600 mg Oral TID  . levothyroxine  125 mcg Oral QAC breakfast  . methocarbamol  750 mg Oral TID  . methylphenidate  5 mg Oral TID WC  . mirabegron ER  50 mg Oral Daily  . multivitamin with minerals  1 tablet Oral BID  . mupirocin ointment  1 application Topical TID  . pantoprazole  80 mg Oral Daily  . rivaroxaban  20 mg Oral Q supper  . senna-docusate  1 tablet Oral QHS  . topiramate  25 mg Oral BID  . vitamin B-12  5,000 mcg Oral Q1200  . vitamin C  500 mg Oral Q1200  . [START ON 05/06/2017] Vitamin D (Ergocalciferol)  50,000 Units Oral Q7 days   Continuous Infusions: . sodium chloride     PRN Meds:.acetaminophen **OR** acetaminophen (TYLENOL) oral liquid 160 mg/5 mL **OR** acetaminophen, bacitracin, baclofen, hydrOXYzine, ondansetron, oxyCODONE, polyethylene glycol, traMADol, traZODone     Results for orders placed or performed during the hospital encounter of 05/01/17 (from the past 48 hour(s))  CBG monitoring, ED     Status: None   Collection Time: 05/01/17 12:20 PM  Result Value Ref Range   Glucose-Capillary 92 65 - 99 mg/dL  Urine rapid drug screen (hosp performed)not at [facility]     Status: None   Collection Time: 05/01/17 12:24 PM  Result Value Ref Range   Opiates NONE DETECTED NONE DETECTED   Cocaine NONE DETECTED NONE DETECTED   Benzodiazepines NONE DETECTED NONE DETECTED   Amphetamines NONE DETECTED NONE DETECTED     Tetrahydrocannabinol NONE DETECTED NONE DETECTED   Barbiturates NONE DETECTED NONE DETECTED    Comment: (NOTE) DRUG SCREEN FOR MEDICAL PURPOSES ONLY.  IF CONFIRMATION IS NEEDED FOR ANY PURPOSE, NOTIFY LAB WITHIN 5 DAYS. LOWEST DETECTABLE LIMITS FOR URINE DRUG SCREEN Drug Class                     Cutoff (ng/mL) Amphetamine and metabolites    1000 Barbiturate and metabolites    200 Benzodiazepine                 789 Tricyclics and metabolites     300 Opiates and metabolites        300 Cocaine and metabolites        300 THC                            50 Performed at [facility], [address] [place], [place] [address]  Urinalysis, Routine w reflex microscopic     Status: Abnormal   Collection Time: 05/01/17 12:24 PM  Result Value Ref Range   Color, Urine STRAW (A) YELLOW   APPearance CLEAR CLEAR   Specific Gravity, Urine 1.011 1.005 - 1.030   pH 8.0 5.0 - 8.0   Glucose, UA NEGATIVE NEGATIVE mg/dL   Hgb urine dipstick SMALL (A) NEGATIVE   Bilirubin Urine NEGATIVE NEGATIVE   Ketones, ur NEGATIVE NEGATIVE mg/dL   Protein, ur NEGATIVE NEGATIVE mg/dL   Nitrite NEGATIVE NEGATIVE   Leukocytes, UA NEGATIVE NEGATIVE   RBC / HPF 0-5 0 - 5 RBC/hpf   WBC, UA 0-5 0 - 5 WBC/hpf   Bacteria, UA NONE SEEN NONE SEEN   Squamous Epithelial / LPF 0-5 (A) NONE SEEN    Comment: Performed at [facility], [address], [place], [place] [address]  Ethanol     Status: None   Collection Time: 05/01/17 12:32 PM  Result Value Ref Range   Alcohol, Ethyl (B) <10 <10 mg/dL    Comment:        LOWEST DETECTABLE LIMIT FOR SERUM ALCOHOL IS 10 mg/dL FOR MEDICAL PURPOSES ONLY Performed at [facility], [address] [place], [place] [address]   Protime-INR     Status: Abnormal   Collection Time: 05/01/17 12:32 PM  Result Value Ref Range   Prothrombin Time 15.6 (H) 11.4 - 15.2 seconds   INR 1.25     Comment: Performed at [facility], [address] [place], [place] [address]  APTT     Status:  Abnormal   Collection Time: 05/01/17 12:32 PM  Result Value Ref Range   aPTT 45 (H) 24 - 36 seconds    Comment:        IF BASELINE aPTT IS ELEVATED, SUGGEST PATIENT RISK ASSESSMENT BE USED TO DETERMINE APPROPRIATE ANTICOAGULANT THERAPY. Performed at [facility], [address] [place], [place] [address]   CBC     Status: None   Collection Time: 05/01/17 12:32 PM  Result Value Ref Range   WBC 8.5 4.0 - 10.5 K/uL   RBC 4.07 3.87 - 5.11 MIL/uL   Hemoglobin 13.0 12.0 - 15.0 g/dL   HCT 40.2 36.0 - 46.0 %   MCV 98.8 78.0 - 100.0 fL   MCH 31.9 26.0 - 34.0 pg   MCHC 32.3 30.0 - 36.0 g/dL   RDW 12.2 11.5 - 15.5 %   Platelets 230 150 - 400 K/uL    Comment: Performed at [facility], [address], [place], [place] [address]  Differential     Status: None   Collection Time: 05/01/17 12:32 PM  Result Value Ref Range   Neutrophils Relative % 57 %   Neutro Abs 4.8 1.7 - 7.7 K/uL   Lymphocytes Relative 31 %   Lymphs Abs 2.6 0.7 - 4.0 K/uL   Monocytes Relative 9 %   Monocytes Absolute 0.8 0.1 - 1.0 K/uL   Eosinophils Relative 2 %   Eosinophils Absolute 0.2 0.0 - 0.7 K/uL   Basophils Relative 1 %   Basophils Absolute 0.1 0.0 - 0.1 K/uL    Comment: Performed at [facility], [address], [place], [place] [address]  Comprehensive metabolic panel     Status: Abnormal   Collection Time: 05/01/17 12:32 PM  Result Value Ref Range   Sodium 139 135 - 145 mmol/L   Potassium 3.7 3.5 - 5.1 mmol/L   Chloride 104 101 - 111 mmol/L   CO2 26  22 - 32 mmol/L   Glucose, Bld 65 65 - 99 mg/dL   BUN 18 6 - 20 mg/dL   Creatinine, Ser 0.83 0.44 - 1.00 mg/dL   Calcium 9.2 8.9 - 10.3 mg/dL   Total Protein 6.9 6.5 - 8.1 g/dL   Albumin 4.0 3.5 - 5.0 g/dL   AST 407 (H) 15 - 41 U/L   ALT 221 (H) 14 - 54 U/L   Alkaline Phosphatase 158 (H) 38 - 126 U/L   Total Bilirubin 0.5 0.3 - 1.2 mg/dL   GFR calc non Af Amer >60 >60 mL/min   GFR calc Af Amer >60 >60 mL/min    Comment: (NOTE) The eGFR has been  calculated using the CKD EPI equation. This calculation has not been validated in all clinical situations. eGFR's persistently <60 mL/min signify possible Chronic Kidney Disease.    Anion gap 9 5 - 15    Comment: Performed at [facility], [address] [place], [place] [address]  I-stat troponin, ED (not at [facility])     Status: None   Collection Time: 05/01/17 12:46 PM  Result Value Ref Range   Troponin i, poc 0.00 0.00 - 0.08 ng/mL   Comment 3            Comment: Due to the release kinetics of cTnI, a negative result within the first hours of the onset of symptoms does not rule out myocardial infarction with certainty. If myocardial infarction is still suspected, repeat the test at appropriate intervals.   I-Stat Chem 8, ED  (not at [facility], [facility])     Status: Abnormal   Collection Time: 05/01/17 12:48 PM  Result Value Ref Range   Sodium 140 135 - 145 mmol/L   Potassium 3.6 3.5 - 5.1 mmol/L   Chloride 101 101 - 111 mmol/L   BUN 17 6 - 20 mg/dL   Creatinine, Ser 1.00 0.44 - 1.00 mg/dL   Glucose, Bld 60 (L) 65 - 99 mg/dL   Calcium, Ion 1.19 1.15 - 1.40 mmol/L   TCO2 27 22 - 32 mmol/L   Hemoglobin 12.9 12.0 - 15.0 g/dL   HCT 38.0 36.0 - 46.0 %  I-Stat beta hCG blood, ED     Status: None   Collection Time: 05/01/17 12:51 PM  Result Value Ref Range   I-stat hCG, quantitative <5.0 <5 mIU/mL   Comment 3            Comment:   GEST. AGE      CONC.  (mIU/mL)   <=1 WEEK        5 - 50     2 WEEKS       50 - 500     3 WEEKS       100 - 10,000     4 WEEKS     1,000 - 30,000        FEMALE AND NON-PREGNANT FEMALE:     LESS THAN 5 mIU/mL   CBG monitoring, ED     Status: None   Collection Time: 05/01/17  3:03 PM  Result Value Ref Range   Glucose-Capillary 98 65 - 99 mg/dL  Urinalysis, Complete w Microscopic     Status: None   Collection Time: 05/01/17  3:29 PM  Result Value Ref Range   Color, Urine YELLOW YELLOW   APPearance CLEAR CLEAR   Specific Gravity, Urine 1.010 1.005 - 1.030    pH 7.0 5.0 - 8.0   Glucose, UA NEGATIVE NEGATIVE  mg/dL   Hgb urine dipstick NEGATIVE NEGATIVE   Bilirubin Urine NEGATIVE NEGATIVE   Ketones, ur NEGATIVE NEGATIVE mg/dL   Protein, ur NEGATIVE NEGATIVE mg/dL   Nitrite NEGATIVE NEGATIVE   Leukocytes, UA NEGATIVE NEGATIVE   Squamous Epithelial / LPF NONE SEEN NONE SEEN   WBC, UA NONE SEEN 0 - 5 WBC/hpf   RBC / HPF NONE SEEN 0 - 5 RBC/hpf   Bacteria, UA NONE SEEN NONE SEEN    Comment: Performed at [facility], [address] [place], [place] [address]  TSH     Status: None   Collection Time: 05/01/17  3:40 PM  Result Value Ref Range   TSH 4.289 0.350 - 4.500 uIU/mL    Comment: Performed by a 3rd Generation assay with a functional sensitivity of <=0.01 uIU/mL. Performed at [facility], [address] [place], [place] [address]   Hepatitis B surface antigen     Status: None   Collection Time: 05/01/17  3:40 PM  Result Value Ref Range   Hepatitis B Surface Ag Negative Negative    Comment: (NOTE) Performed At: [facility] [place], [place] [id number] Rush Farmer MD [id number] Performed at [facility], [address] [place], [place] [address]   Hepatitis C antibody     Status: None   Collection Time: 05/01/17  3:40 PM  Result Value Ref Range   HCV Ab <0.1 0.0 - 0.9 s/co ratio    Comment: (NOTE)                                  Negative:     < 0.8                             Indeterminate: 0.8 - 0.9                                  Positive:     > 0.9 The CDC recommends that a positive HCV antibody result be followed up with a HCV Nucleic Acid Amplification test (286381). Performed At: [facility] [place], [place] [id number] Rush Farmer MD [id number] Performed at [facility], [address] [place], [place] [address]   Glucose, capillary     Status: None   Collection Time: 05/01/17  8:52 PM  Result Value Ref Range   Glucose-Capillary 72 65 - 99 mg/dL   Comment 1  Notify RN    Comment 2 Document in Chart   Glucose, capillary     Status: Abnormal   Collection Time: 05/01/17 11:55 PM  Result Value Ref Range   Glucose-Capillary 236 (H) 65 - 99 mg/dL  Hemoglobin A1c     Status: None   Collection Time: 05/02/17  4:19 AM  Result Value Ref Range   Hgb A1c MFr Bld 5.0 4.8 - 5.6 %    Comment: (NOTE) Pre diabetes:          5.7%-6.4% Diabetes:              >6.4% Glycemic control for   <7.0% adults with diabetes    Mean Plasma Glucose 96.8 mg/dL    Comment: Performed at [facility], [place] [address], [place], [place] [address]  Lipid panel     Status: None   Collection Time:  05/02/17  4:19 AM  Result Value Ref Range   Cholesterol 151 0 - 200 mg/dL   Triglycerides 54 <150 mg/dL   HDL 74 >40 mg/dL   Total CHOL/HDL Ratio 2.0 RATIO   VLDL 11 0 - 40 mg/dL   LDL Cholesterol 66 0 - 99 mg/dL    Comment:        Total Cholesterol/HDL:CHD Risk Coronary Heart Disease Risk Table                     Men   Women  1/2 Average Risk   3.4   3.3  Average Risk       5.0   4.4  2 X Average Risk   9.6   7.1  3 X Average Risk  23.4   11.0        Use the calculated Patient Ratio above and the CHD Risk Table to determine the patient's CHD Risk.        ATP III CLASSIFICATION (LDL):  <100     mg/dL   Optimal  100-129  mg/dL   Near or Above                    Optimal  130-159  mg/dL   Borderline  160-189  mg/dL   High  >190     mg/dL   Very High Performed at [facility] [place], [place] [address]   Comprehensive metabolic panel     Status: Abnormal   Collection Time: 05/02/17  4:19 AM  Result Value Ref Range   Sodium 142 135 - 145 mmol/L   Potassium 4.1 3.5 - 5.1 mmol/L   Chloride 108 101 - 111 mmol/L   CO2 26 22 - 32 mmol/L   Glucose, Bld 89 65 - 99 mg/dL   BUN 16 6 - 20 mg/dL   Creatinine, Ser 0.79 0.44 - 1.00 mg/dL   Calcium 8.8 (L) 8.9 - 10.3 mg/dL   Total Protein 5.8 (L) 6.5 - 8.1 g/dL   Albumin 3.4 (L) 3.5 - 5.0 g/dL   AST 104  (H) 15 - 41 U/L   ALT 149 (H) 14 - 54 U/L   Alkaline Phosphatase 120 38 - 126 U/L   Total Bilirubin 0.3 0.3 - 1.2 mg/dL   GFR calc non Af Amer >60 >60 mL/min   GFR calc Af Amer >60 >60 mL/min    Comment: (NOTE) The eGFR has been calculated using the CKD EPI equation. This calculation has not been validated in all clinical situations. eGFR's persistently <60 mL/min signify possible Chronic Kidney Disease.    Anion gap 8 5 - 15    Comment: Performed at [facility], [address], [place], [place] [address]  Glucose, capillary     Status: None   Collection Time: 05/02/17  7:20 AM  Result Value Ref Range   Glucose-Capillary 96 65 - 99 mg/dL  Acetaminophen level     Status: Abnormal   Collection Time: 05/02/17  8:47 AM  Result Value Ref Range   Acetaminophen (Tylenol), Serum <10 (L) 10 - 30 ug/mL    Comment:        THERAPEUTIC CONCENTRATIONS VARY SIGNIFICANTLY. A RANGE OF 10-30 ug/mL MAY BE AN EFFECTIVE CONCENTRATION FOR MANY PATIENTS. HOWEVER, SOME ARE BEST TREATED AT CONCENTRATIONS OUTSIDE THIS RANGE. ACETAMINOPHEN CONCENTRATIONS >150 ug/mL AT 4 HOURS AFTER INGESTION AND >50 ug/mL AT 12 HOURS AFTER INGESTION ARE OFTEN ASSOCIATED WITH TOXIC REACTIONS. Performed at  [address] [place], [place] [address]   Glucose, capillary     Status: Abnormal   Collection Time: 05/02/17 11:27 AM  Result Value Ref Range   Glucose-Capillary 170 (H) 65 - 99 mg/dL  Glucose, capillary     Status: Abnormal   Collection Time: 05/02/17  1:22 PM  Result Value Ref Range   Glucose-Capillary 105 (H) 65 - 99 mg/dL    Studies/Results:  HEAD NECK CTA FINDINGS: CTA NECK FINDINGS  Aortic arch: Normal.  Four vessel branching pattern.  Right carotid system: ICA tortuosity with looping. Vessels are smooth and diffusely patent. No atheromatous changes.  Left carotid system: ICA tortuosity. Vessels are smooth and diffusely patent. No noted atheromatous changes.  Vertebral  arteries: Strong right vertebral artery dominance.  Skeleton: No acute or aggressive finding  Other neck: Negative  Upper chest: Negative  Review of the MIP images confirms the above findings  CTA HEAD FINDINGS  Anterior circulation: Small right A1 segment, developmental appearing. No major branch occlusion, flow limiting stenosis, or beading. 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 3D reformats reviewed in [employer] but images could not be explored to PACS.  Posterior circulation: Fetal type PCA on the right. Strong right vertebral artery dominance. Major vessels are smooth and diffusely patent.  Venous sinuses: Patent  Anatomic variants: Fetal type right PCA and hypoplastic right A1 segment.  Delayed phase: No abnormal intracranial enhancement  Review of the MIP images confirms the above findings  CT Brain Perfusion Findings:  CBF (<30%) Volume: 69m  Perfusion (Tmax>6.0s) volume: 052m IMPRESSION: 1. No emergent large vessel occlusion or acute infarct/penumbra by CT perfusion. 2. Remote left PCA distribution infarct. 3. No noted atheromatous changes or stenosis. 4. 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 Recommend follow-up in this young patient.           Ahja Martello A. DoMerlene LaughterM.D.  Diplomate, [employer] adviserf Psychiatry and Neurology ( Neurology). 05/02/2017, 1:25 PM

## 2017-05-02 NOTE — Care Management Obs Status (Signed)
[facility] NOTIFICATION   Patient Details  Name: Debbie Pineda MRN: [id number] Date of Birth: January 18, 1961   Medicare Observation Status Notification Given:  Yes    Sherald Barge, RN 05/02/2017, 10:58 AM

## 2017-05-02 NOTE — Plan of Care (Signed)
  Problem: Acute Rehab PT Goals(only PT should resolve) Goal: Pt Will Transfer Bed To Chair/Chair To Bed Outcome: Progressing Flowsheets (Taken 05/02/2017 1117) Pt will Transfer Bed to Chair/Chair to Bed: Independently Goal: Pt Will Ambulate Outcome: Progressing Flowsheets (Taken 05/02/2017 1117) Pt will Ambulate: 100 feet;with cane;with modified independence   11:18 AM, 05/02/17 Lonell Grandchild, MPT Physical Therapist with [facility] [phone] office [phone] mobile phone

## 2017-05-02 NOTE — Progress Notes (Signed)
OT Cancellation Note  Patient Details Name: Debbie Pineda MRN: [id number] DOB: 1960-05-03   Cancelled Treatment:    Reason Eval/Treat Not Completed: OT screened, no needs identified, will sign off. Chart reviewed, pt screened this am. Pt fatigued when OT in room, occasional word finding difficulties, no confusion noted during conversation. Husband present and confirming pt reports of symptoms and functioning. Pt reporting back pain of 8/10 overnight, back down to 4/10 this am after repositioning; reports migraine this am due to not wearing CPAP last night. Pt declined to sit up in chair as she just got comfortable with her pain level while lying in the bed. Pt with no difficulties with toileting and hygiene tasks, husband provided hand held assist to restroom this am for safety. Pt with BUE strength WFL, coordination and sensation intact, pt feeling generally weak and fatigued since spinal stimulator placed on 04/18/17. Pt also reports having increase in headaches over past month. Pt uses SPC at home, has shower chair she uses for bathing. Educated pt on energy conservation strategies to begin implementing including prioritizing tasks, pacing herself during the day, and sitting for tasks when able. Pt and husband verbalized understanding, pt reports she typically tries to get everything done on her good days, however the next 2 days she is wiped out therefore implementing these strategies might help reduce her fatigue. No further OT services needed at this time, pt's primary concern is her word finding difficulties, pt is generally weak however able to perform B/ADLs safely with supervision and increased time.   Guadelupe Sabin, OTR/L  [phone] 05/02/2017, 7:52 AM
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
## 2017-05-03 ENCOUNTER — Telehealth: Payer: Self-pay | Admitting: Neurology

## 2017-05-03 ENCOUNTER — Encounter: Payer: Self-pay | Admitting: Psychology

## 2017-05-03 DIAGNOSIS — R1010 Upper abdominal pain, unspecified: Secondary | ICD-10-CM | POA: Diagnosis not present

## 2017-05-03 DIAGNOSIS — E669 Obesity, unspecified: Secondary | ICD-10-CM | POA: Diagnosis present

## 2017-05-03 DIAGNOSIS — E039 Hypothyroidism, unspecified: Secondary | ICD-10-CM | POA: Diagnosis present

## 2017-05-03 DIAGNOSIS — Z885 Allergy status to narcotic agent status: Secondary | ICD-10-CM | POA: Diagnosis not present

## 2017-05-03 DIAGNOSIS — Z9884 Bariatric surgery status: Secondary | ICD-10-CM | POA: Diagnosis not present

## 2017-05-03 DIAGNOSIS — E1151 Type 2 diabetes mellitus with diabetic peripheral angiopathy without gangrene: Secondary | ICD-10-CM | POA: Diagnosis present

## 2017-05-03 DIAGNOSIS — D6851 Activated protein C resistance: Secondary | ICD-10-CM | POA: Diagnosis present

## 2017-05-03 DIAGNOSIS — T402X5A Adverse effect of other opioids, initial encounter: Secondary | ICD-10-CM | POA: Diagnosis present

## 2017-05-03 DIAGNOSIS — H53461 Homonymous bilateral field defects, right side: Secondary | ICD-10-CM | POA: Diagnosis present

## 2017-05-03 DIAGNOSIS — T426X5A Adverse effect of other antiepileptic and sedative-hypnotic drugs, initial encounter: Secondary | ICD-10-CM | POA: Diagnosis present

## 2017-05-03 DIAGNOSIS — R4701 Aphasia: Secondary | ICD-10-CM | POA: Diagnosis present

## 2017-05-03 DIAGNOSIS — R740 Nonspecific elevation of levels of transaminase and lactic acid dehydrogenase [LDH]: Secondary | ICD-10-CM | POA: Diagnosis present

## 2017-05-03 DIAGNOSIS — R748 Abnormal levels of other serum enzymes: Secondary | ICD-10-CM | POA: Diagnosis present

## 2017-05-03 DIAGNOSIS — K838 Other specified diseases of biliary tract: Secondary | ICD-10-CM | POA: Diagnosis not present

## 2017-05-03 DIAGNOSIS — Q211 Atrial septal defect: Secondary | ICD-10-CM | POA: Diagnosis not present

## 2017-05-03 DIAGNOSIS — I69998 Other sequelae following unspecified cerebrovascular disease: Secondary | ICD-10-CM | POA: Diagnosis not present

## 2017-05-03 DIAGNOSIS — G40209 Localization-related (focal) (partial) symptomatic epilepsy and epileptic syndromes with complex partial seizures, not intractable, without status epilepticus: Secondary | ICD-10-CM | POA: Diagnosis present

## 2017-05-03 DIAGNOSIS — Z7984 Long term (current) use of oral hypoglycemic drugs: Secondary | ICD-10-CM | POA: Diagnosis not present

## 2017-05-03 DIAGNOSIS — R4702 Dysphasia: Secondary | ICD-10-CM | POA: Diagnosis present

## 2017-05-03 DIAGNOSIS — Z7901 Long term (current) use of anticoagulants: Secondary | ICD-10-CM | POA: Diagnosis not present

## 2017-05-03 DIAGNOSIS — E11649 Type 2 diabetes mellitus with hypoglycemia without coma: Secondary | ICD-10-CM | POA: Diagnosis present

## 2017-05-03 DIAGNOSIS — I671 Cerebral aneurysm, nonruptured: Secondary | ICD-10-CM | POA: Diagnosis present

## 2017-05-03 DIAGNOSIS — R4189 Other symptoms and signs involving cognitive functions and awareness: Secondary | ICD-10-CM | POA: Diagnosis present

## 2017-05-03 DIAGNOSIS — G894 Chronic pain syndrome: Secondary | ICD-10-CM | POA: Diagnosis present

## 2017-05-03 DIAGNOSIS — Z882 Allergy status to sulfonamides status: Secondary | ICD-10-CM | POA: Diagnosis not present

## 2017-05-03 DIAGNOSIS — Z6835 Body mass index (BMI) 35.0-35.9, adult: Secondary | ICD-10-CM | POA: Diagnosis not present

## 2017-05-03 LAB — URINE CULTURE

## 2017-05-03 LAB — GLUCOSE, CAPILLARY
Glucose-Capillary: 89 mg/dL (ref 65–99)
Glucose-Capillary: 97 mg/dL (ref 65–99)

## 2017-05-03 LAB — HEPATIC FUNCTION PANEL
ALT: 120 U/L — ABNORMAL HIGH (ref 14–54)
AST: 51 U/L — ABNORMAL HIGH (ref 15–41)
Albumin: 3.7 g/dL (ref 3.5–5.0)
Alkaline Phosphatase: 123 U/L (ref 38–126)
Bilirubin, Direct: 0.1 mg/dL (ref 0.1–0.5)
Indirect Bilirubin: 0.3 mg/dL (ref 0.3–0.9)
Total Bilirubin: 0.4 mg/dL (ref 0.3–1.2)
Total Protein: 6.5 g/dL (ref 6.5–8.1)

## 2017-05-03 MED ORDER — TOPIRAMATE 50 MG PO TABS: 50.0000 mg | ORAL_TABLET | Freq: Two times a day (BID) | ORAL | 0 refills | Status: DC

## 2017-05-03 MED ORDER — OXYCODONE HCL 5 MG PO TABS
10.0000 mg | ORAL_TABLET | Freq: Once | ORAL | Status: AC
  Administered 2017-05-03: 10 mg via ORAL
  Filled 2017-05-03: qty 2

## 2017-05-03 NOTE — Progress Notes (Signed)
Reviewed AVS with patient and patient's husband, both verbalized understanding.  Patient taken downstairs via wheelchair and transported home by husband.

## 2017-05-03 NOTE — Progress Notes (Signed)
  Subjective:  Patient has noted new complaints.  Objective: Blood pressure 111/76, pulse 64, temperature 98 F (36.7 C), temperature source Oral, resp. rate 18, height 5\' 5"  (1.651 m), weight 213 lb 13.5 oz (97 kg), SpO2 98 %.  Labs/studies Results:     05/01/17 1232 05/02/17 0419 05/03/17 0436  PROT 6.9 5.8* 6.5  ALBUMIN 4.0 3.4* 3.7  AST 407* 104* 51*  ALT 221* 149* 120*  ALKPHOS 158* 120 123  BILITOT 0.5 0.3 0.4  BILIDIR  --   --  0.1  IBILI  --   --  0.3      05/01/17 1540  HEPBSAG Negative  HCVAB <0.1     Assessment:  #1.  Elevated transaminases.  Significant improvement in the last 48 hours.  No change in patient's abdominal pain which is predominantly epigastric and left upper quadrant.  Sphincter of Oddi dysfunction or microlithiasis remains in differential diagnosis.   Recommendations:  Repeat LFTs on 05/13/2017. My office will contact patient.

## 2017-05-03 NOTE — Telephone Encounter (Signed)
I called pt, offered her several work in appts. Pt is agreeable to an appt on 05/08/17 at 3:00, check in 2:30pm, with Dr. Jannifer Franklin. Pt verbalized understanding and appreciation.

## 2017-05-03 NOTE — Discharge Summary (Signed)
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 standard protocol during bolus administration of intravenous contrast. Multiplanar CT image reconstructions and MIPs were obtained to evaluate the vascular anatomy. Carotid stenosis measurements (when applicable) are obtained utilizing NASCET criteria, using the distal internal carotid diameter as the denominator. Multiphase CT imaging of the brain was performed following IV bolus contrast injection. Subsequent parametric perfusion maps were calculated using RAPID software. CONTRAST:  162m ISOVUE-370 IOPAMIDOL (ISOVUE-370) INJECTION 76% COMPARISON:  Noncontrast head CT from earlier today. FINDINGS: CTA NECK FINDINGS Aortic arch: Normal.  Four vessel branching pattern. Right carotid system: ICA tortuosity with looping. Vessels are smooth and diffusely patent. No atheromatous changes. Left carotid system: ICA tortuosity. Vessels are smooth and diffusely patent. No noted  atheromatous changes. Vertebral arteries: Strong right vertebral artery dominance. Skeleton: No acute or aggressive finding Other neck: Negative Upper chest: Negative Review of the MIP images confirms the above findings CTA HEAD FINDINGS Anterior circulation: Small right A1 segment, developmental appearing. No major branch occlusion, flow limiting stenosis, or beading. 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 3D reformats reviewed in aquariusnet but images could not be explored to PACS. Posterior circulation: Fetal type PCA on the right. Strong right vertebral artery dominance. Major vessels are smooth and diffusely patent. Venous sinuses: Patent Anatomic variants: Fetal type right PCA and hypoplastic right A1 segment. Delayed phase: No abnormal intracranial enhancement Review of the MIP images confirms the above findings CT Brain Perfusion Findings: CBF (<30%) Volume: 066mPerfusion (Tmax>6.0s) volume: 24m29mMPRESSION: 1. No emergent large vessel occlusion or acute infarct/penumbra by CT perfusion. 2. Remote left PCA distribution infarct. 3. No noted atheromatous changes or stenosis. 4. 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 Recommend follow-up in this young patient. Electronically Signed   By: JonMonte FantasiaD.   On: 05/01/2017 13:42   Dg Lumbar Spine 2-3 Views  Result Date: 04/18/2017 CLINICAL DATA:  Spinal stimulator placement. EXAM: DG C-ARM 61-120 MIN; LUMBAR SPINE - 2-3 VIEW FLUOROSCOPY TIME:  2 minutes and 53 seconds. COMPARISON:  None. FINDINGS: Two intraoperative spot fluoroscopic spot views of the thoracic spine demonstrate  spinal stimulator leads projecting within the spinal canal. IMPRESSION: Intraoperative spot views for spinal stimulator placement. Electronically Signed   By: Elon Alas M.D.   On: 04/18/2017 19:32   Ct Angio Neck W Or Wo Contrast  Result Date: 05/01/2017 CLINICAL DATA:  Stroke  follow-up. Confusion and expressive aphasia beginning today. EXAM: CT ANGIOGRAPHY HEAD AND NECK CT PERFUSION BRAIN TECHNIQUE: Multidetector CT imaging of the head and neck was performed using the standard protocol during bolus administration of intravenous contrast. Multiplanar CT image reconstructions and MIPs were obtained to evaluate the vascular anatomy. Carotid stenosis measurements (when applicable) are obtained utilizing NASCET criteria, using the distal internal carotid diameter as the denominator. Multiphase CT imaging of the brain was performed following IV bolus contrast injection. Subsequent parametric perfusion maps were calculated using RAPID software. CONTRAST:  133m ISOVUE-370 IOPAMIDOL (ISOVUE-370) INJECTION 76% COMPARISON:  Noncontrast head CT from earlier today. FINDINGS: CTA NECK FINDINGS Aortic arch: Normal.  Four vessel branching pattern. Right carotid system: ICA tortuosity with looping. Vessels are smooth and diffusely patent. No atheromatous changes. Left carotid system: ICA tortuosity. Vessels are smooth and diffusely patent. No noted atheromatous changes. Vertebral arteries: Strong right vertebral artery dominance. Skeleton: No acute or aggressive finding Other neck: Negative Upper chest: Negative Review of the MIP images confirms the above findings CTA HEAD FINDINGS Anterior circulation: Small right A1 segment, developmental appearing. No major branch occlusion, flow limiting stenosis, or beading. 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 3D reformats reviewed in aquariusnet but images could not be explored to PACS. Posterior circulation: Fetal type PCA on the right. Strong right vertebral artery dominance. Major vessels are smooth and diffusely patent. Venous sinuses: Patent Anatomic variants: Fetal type right PCA and hypoplastic right A1 segment. Delayed phase: No abnormal intracranial enhancement Review of the MIP images confirms the  above findings CT Brain Perfusion Findings: CBF (<30%) Volume: 035mPerfusion (Tmax>6.0s) volume: 22m42mMPRESSION: 1. No emergent large vessel occlusion or acute infarct/penumbra by CT perfusion. 2. Remote left PCA distribution infarct. 3. No noted atheromatous changes or stenosis. 4. 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 Recommend follow-up in this young patient. Electronically Signed   By: JonMonte FantasiaD.   On: 05/01/2017 13:42   Us Korearotid Bilateral (at [facility] And [facility] Only)  Result Date: 05/01/2017 CLINICAL DATA:  [age]o F; speech difficulty, altered mental status, history of stroke. EXAM: BILATERAL CAROTID DUPLEX ULTRASOUND TECHNIQUE: [place] imaging, color Doppler and duplex ultrasound were performed of bilateral carotid and vertebral arteries in the neck. COMPARISON:  05/01/2017 CT angiogram of the neck. FINDINGS: Criteria: Quantification of carotid stenosis is based on velocity parameters that correlate the residual internal carotid diameter with NASCET-based stenosis levels, using the diameter of the distal internal carotid lumen as the denominator for stenosis measurement. The following velocity measurements were obtained: RIGHT ICA:  82 cm/sec CCA:  82 cm/sec SYSTOLIC ICA/CCA RATIO:  1.0 DIASTOLIC ICA/CCA RATIO:  1.1 ECA:  80 cm/sec LEFT ICA:  122 cm/sec CCA:  120537/sec SYSTOLIC ICA/CCA RATIO:  1.0 DIASTOLIC ICA/CCA RATIO:  2.3 ECA:  84 cm/sec RIGHT CAROTID ARTERY: Intimal wall thickening. No significant plaque. RIGHT VERTEBRAL ARTERY:  Antegrade flow. LEFT CAROTID ARTERY: Intimal wall thickening. No significant plaque. LEFT VERTEBRAL ARTERY:  Antegrade flow. IMPRESSION: No hemodynamically significant stenosis of the carotid arteries. Antegrade vertebral artery flow. Electronically Signed   By: LanKristine GarbeD.   On: 05/01/2017 19:15   Ct Cerebral  Perfusion W Contrast  Result Date: 05/01/2017 CLINICAL DATA:  Stroke follow-up. Confusion and  expressive aphasia beginning today. EXAM: CT ANGIOGRAPHY HEAD AND NECK CT PERFUSION BRAIN TECHNIQUE: Multidetector CT imaging of the head and neck was performed using the standard protocol during bolus administration of intravenous contrast. Multiplanar CT image reconstructions and MIPs were obtained to evaluate the vascular anatomy. Carotid stenosis measurements (when applicable) are obtained utilizing NASCET criteria, using the distal internal carotid diameter as the denominator. Multiphase CT imaging of the brain was performed following IV bolus contrast injection. Subsequent parametric perfusion maps were calculated using RAPID software. CONTRAST:  161m ISOVUE-370 IOPAMIDOL (ISOVUE-370) INJECTION 76% COMPARISON:  Noncontrast head CT from earlier today. FINDINGS: CTA NECK FINDINGS Aortic arch: Normal.  Four vessel branching pattern. Right carotid system: ICA tortuosity with looping. Vessels are smooth and diffusely patent. No atheromatous changes. Left carotid system: ICA tortuosity. Vessels are smooth and diffusely patent. No noted atheromatous changes. Vertebral arteries: Strong right vertebral artery dominance. Skeleton: No acute or aggressive finding Other neck: Negative Upper chest: Negative Review of the MIP images confirms the above findings CTA HEAD FINDINGS Anterior circulation: Small right A1 segment, developmental appearing. No major branch occlusion, flow limiting stenosis, or beading. 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 3D reformats reviewed in aquariusnet but images could not be explored to PACS. Posterior circulation: Fetal type PCA on the right. Strong right vertebral artery dominance. Major vessels are smooth and diffusely patent. Venous sinuses: Patent Anatomic variants: Fetal type right PCA and hypoplastic right A1 segment. Delayed phase: No abnormal intracranial enhancement Review of the MIP images confirms the above findings CT Brain  Perfusion Findings: CBF (<30%) Volume: 077mPerfusion (Tmax>6.0s) volume: 69m37mMPRESSION: 1. No emergent large vessel occlusion or acute infarct/penumbra by CT perfusion. 2. Remote left PCA distribution infarct. 3. No noted atheromatous changes or stenosis. 4. 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 Recommend follow-up in this young patient. Electronically Signed   By: JonMonte FantasiaD.   On: 05/01/2017 13:42   Dg C-arm 1-60 Min  Result Date: 04/18/2017 CLINICAL DATA:  Spinal stimulator placement. EXAM: DG C-ARM 61-120 MIN; LUMBAR SPINE - 2-3 VIEW FLUOROSCOPY TIME:  2 minutes and 53 seconds. COMPARISON:  None. FINDINGS: Two intraoperative spot fluoroscopic spot views of the thoracic spine demonstrate spinal stimulator leads projecting within the spinal canal. IMPRESSION: Intraoperative spot views for spinal stimulator placement. Electronically Signed   By: CouElon AlasD.   On: 04/18/2017 19:32   Xr Pelvis 1-2 Views  Result Date: 04/16/2017 AP of the pelvis no degenerative changes of the hip bilaterally.  There is no calcification over the greater trochanter bursitis bursa.  Patient does have degenerative changes through the lower lumbar spine.  Ct Head Code Stroke Wo Contrast  Result Date: 05/01/2017 CLINICAL DATA:  Code stroke. Sudden onset of confusion and expressive aphasia beginning 2 hours ago. EXAM: CT HEAD WITHOUT CONTRAST TECHNIQUE: Contiguous axial images were obtained from the base of the skull through the vertex without intravenous contrast. COMPARISON:  MRI brain 01/26/2017 FINDINGS: Brain: Remote encephalomalacia of the left posterior inferior temporal and inferior occipital lobe is stable. No acute infarct, hemorrhage, or mass lesion is present. The ventricles are of normal size. No significant extra-axial fluid collection is present. Brainstem and cerebellum are within normal limits. Vascular: No hyperdense vessel or unexpected calcification.  Skull: Calvarium is intact. No focal lytic or blastic lesions are present. Sinuses/Orbits: The paranasal sinuses and  mastoid air cells are clear. Globes and orbits are within normal limits. ASPECTS ([place] Stroke Program Early CT Score) - Ganglionic level infarction (caudate, lentiform nuclei, internal capsule, insula, M1-M3 cortex): 7/7 - Supraganglionic infarction (M4-M6 cortex): 3/3 Total score (0-10 with 10 being normal): 10/10 IMPRESSION: 1. Stable encephalomalacia of the inferior left occipital and temporal lobe compatible with a remote PCA territory infarct. 2. No acute intracranial abnormality. 3. ASPECTS is 10/10 These results were called by telephone at the time of interpretation on 05/01/2017 at 12:41 pm to Dr. Francine Graven , who verbally acknowledged these results. Electronically Signed   By: San Morelle M.D.   On: 05/01/2017 12:45   US Abdomen Limited Ruq  Result Date: 05/01/2017 CLINICAL DATA:  Transaminitis.  History of a cholecystectomy. EXAM: ULTRASOUND ABDOMEN LIMITED RIGHT UPPER QUADRANT COMPARISON:  CT, 03/04/2017 FINDINGS: Gallbladder: Status post cholecystectomy. No abnormality in the gallbladder fossa Common bile duct: Diameter: 10 mm, similar to the prior CT. No duct stone seen sonographically. Liver: No focal lesion identified. Within normal limits in parenchymal echogenicity. Portal vein is patent on color Doppler imaging with normal direction of blood flow towards the liver. IMPRESSION: 1. No acute findings. 2. Status post cholecystectomy. Chronic dilation of common bile duct. 3. Liver appearance is unremarkable. Electronically Signed   By: Lajean Manes M.D.   On: 05/01/2017 18:27     Microbiology: Recent Results (from the past 240 hour(s))  Culture, Urine     Status: Abnormal   Collection Time: 05/01/17  3:29 PM  Result Value Ref Range Status   Specimen Description   Final    URINE, CLEAN CATCH Performed at [facility], [address] [place], [place]  [address]    Special Requests   Final    NONE Performed at [facility], [address] [place], [place] [address]    Culture MULTIPLE SPECIES PRESENT, SUGGEST RECOLLECTION (A)  Final   Report Status 05/03/2017 FINAL  Final     Labs: Basic Metabolic Panel: Recent Labs  Lab 05/01/17 1232 05/01/17 1248 05/02/17 0419  NA 139 140 142  K 3.7 3.6 4.1  CL 104 101 108  CO2 26  --  26  GLUCOSE 65 60* 89  BUN '18 17 16  ' CREATININE 0.83 1.00 0.79  CALCIUM 9.2  --  8.8*   Liver Function Tests: Recent Labs  Lab 05/01/17 1232 05/02/17 0419 05/03/17 0436  AST 407* 104* 51*  ALT 221* 149* 120*  ALKPHOS 158* 120 123  BILITOT 0.5 0.3 0.4  PROT 6.9 5.8* 6.5  ALBUMIN 4.0 3.4* 3.7   No results for input(s): LIPASE, AMYLASE in the last 168 hours. No results for input(s): AMMONIA in the last 168 hours. CBC: Recent Labs  Lab 05/01/17 1232 05/01/17 1248  WBC 8.5  --   NEUTROABS 4.8  --   HGB 13.0 12.9  HCT 40.2 38.0  MCV 98.8  --   PLT 230  --    Cardiac Enzymes: No results for input(s): CKTOTAL, CKMB, CKMBINDEX, TROPONINI in the last 168 hours. BNP: Invalid input(s): POCBNP CBG: Recent Labs  Lab 05/02/17 1322 05/02/17 1704 05/02/17 2058 05/03/17 0739 05/03/17 1119  GLUCAP 105* 131* 64* 89 97    Time coordinating discharge:  36 minutes  Signed:  Orson Eva, DO [facility] Pager: [phone] 05/03/2017, 11:32 AM

## 2017-05-03 NOTE — Care Management Important Message (Signed)
Important Message  Patient Details  Name: Debbie Pineda MRN: [id number] Date of Birth: 1960-01-29   Medicare Important Message Given:  Yes    [name] 05/03/2017, 12:46 PM
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
## 2017-05-03 NOTE — Telephone Encounter (Signed)
Pt called stating that in her discharge notes from [facility] be getting discharged later today) she will need to f/u with Dr. Jannifer Franklin within the next few weeks. Pt would like a call from RN to discuss being able to be seen as soon as possible.

## 2017-05-04 DIAGNOSIS — I69311 Memory deficit following cerebral infarction: Secondary | ICD-10-CM | POA: Diagnosis not present

## 2017-05-04 DIAGNOSIS — I69391 Dysphagia following cerebral infarction: Secondary | ICD-10-CM | POA: Diagnosis not present

## 2017-05-04 DIAGNOSIS — K219 Gastro-esophageal reflux disease without esophagitis: Secondary | ICD-10-CM | POA: Diagnosis not present

## 2017-05-04 DIAGNOSIS — Z96653 Presence of artificial knee joint, bilateral: Secondary | ICD-10-CM | POA: Diagnosis not present

## 2017-05-04 DIAGNOSIS — Z7901 Long term (current) use of anticoagulants: Secondary | ICD-10-CM | POA: Diagnosis not present

## 2017-05-04 DIAGNOSIS — Z9884 Bariatric surgery status: Secondary | ICD-10-CM | POA: Diagnosis not present

## 2017-05-04 DIAGNOSIS — Z791 Long term (current) use of non-steroidal anti-inflammatories (NSAID): Secondary | ICD-10-CM | POA: Diagnosis not present

## 2017-05-04 DIAGNOSIS — G894 Chronic pain syndrome: Secondary | ICD-10-CM | POA: Diagnosis not present

## 2017-05-04 DIAGNOSIS — G4733 Obstructive sleep apnea (adult) (pediatric): Secondary | ICD-10-CM | POA: Diagnosis not present

## 2017-05-04 DIAGNOSIS — Z86718 Personal history of other venous thrombosis and embolism: Secondary | ICD-10-CM | POA: Diagnosis not present

## 2017-05-04 DIAGNOSIS — I69398 Other sequelae of cerebral infarction: Secondary | ICD-10-CM | POA: Diagnosis not present

## 2017-05-04 DIAGNOSIS — H5461 Unqualified visual loss, right eye, normal vision left eye: Secondary | ICD-10-CM | POA: Diagnosis not present

## 2017-05-04 DIAGNOSIS — M797 Fibromyalgia: Secondary | ICD-10-CM | POA: Diagnosis not present

## 2017-05-04 DIAGNOSIS — F329 Major depressive disorder, single episode, unspecified: Secondary | ICD-10-CM | POA: Diagnosis not present

## 2017-05-04 DIAGNOSIS — Q211 Atrial septal defect: Secondary | ICD-10-CM | POA: Diagnosis not present

## 2017-05-04 DIAGNOSIS — I69318 Other symptoms and signs involving cognitive functions following cerebral infarction: Secondary | ICD-10-CM | POA: Diagnosis not present

## 2017-05-04 DIAGNOSIS — M545 Low back pain: Secondary | ICD-10-CM | POA: Diagnosis not present

## 2017-05-04 DIAGNOSIS — Z87891 Personal history of nicotine dependence: Secondary | ICD-10-CM | POA: Diagnosis not present

## 2017-05-04 DIAGNOSIS — D6851 Activated protein C resistance: Secondary | ICD-10-CM | POA: Diagnosis not present

## 2017-05-04 DIAGNOSIS — Z79891 Long term (current) use of opiate analgesic: Secondary | ICD-10-CM | POA: Diagnosis not present

## 2017-05-06 ENCOUNTER — Encounter (INDEPENDENT_AMBULATORY_CARE_PROVIDER_SITE_OTHER): Payer: Self-pay | Admitting: *Deleted

## 2017-05-06 ENCOUNTER — Other Ambulatory Visit (INDEPENDENT_AMBULATORY_CARE_PROVIDER_SITE_OTHER): Payer: Self-pay | Admitting: *Deleted

## 2017-05-06 ENCOUNTER — Other Ambulatory Visit: Payer: Self-pay | Admitting: Rheumatology

## 2017-05-06 DIAGNOSIS — R740 Nonspecific elevation of levels of transaminase and lactic acid dehydrogenase [LDH]: Principal | ICD-10-CM

## 2017-05-06 DIAGNOSIS — R7401 Elevation of levels of liver transaminase levels: Secondary | ICD-10-CM

## 2017-05-06 DIAGNOSIS — M797 Fibromyalgia: Secondary | ICD-10-CM

## 2017-05-06 NOTE — Telephone Encounter (Signed)
Last Visit: 12/10/16 Next Visit: 06/06/17  Okay to refill per Dr. Estanislado Pandy

## 2017-05-08 ENCOUNTER — Encounter: Payer: Self-pay | Admitting: Neurology

## 2017-05-08 ENCOUNTER — Ambulatory Visit (INDEPENDENT_AMBULATORY_CARE_PROVIDER_SITE_OTHER): Payer: PPO | Admitting: Neurology

## 2017-05-08 ENCOUNTER — Other Ambulatory Visit: Payer: Self-pay

## 2017-05-08 VITALS — BP 106/72 | HR 81 | Ht 65.0 in | Wt 211.0 lb

## 2017-05-08 DIAGNOSIS — M797 Fibromyalgia: Secondary | ICD-10-CM | POA: Diagnosis not present

## 2017-05-08 DIAGNOSIS — R413 Other amnesia: Secondary | ICD-10-CM

## 2017-05-08 DIAGNOSIS — I679 Cerebrovascular disease, unspecified: Secondary | ICD-10-CM

## 2017-05-08 DIAGNOSIS — R4701 Aphasia: Secondary | ICD-10-CM | POA: Diagnosis not present

## 2017-05-08 MED ORDER — TOPIRAMATE 50 MG PO TABS: ORAL_TABLET | ORAL | 3 refills | Status: DC

## 2017-05-08 NOTE — Patient Instructions (Signed)
   We will increase the Topamax to 50 mg in the morning and 100 mg in the evening.
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
## 2017-05-09 DIAGNOSIS — J302 Other seasonal allergic rhinitis: Secondary | ICD-10-CM | POA: Diagnosis not present

## 2017-05-09 DIAGNOSIS — E781 Pure hyperglyceridemia: Secondary | ICD-10-CM | POA: Diagnosis not present

## 2017-05-09 DIAGNOSIS — I6350 Cerebral infarction due to unspecified occlusion or stenosis of unspecified cerebral artery: Secondary | ICD-10-CM | POA: Diagnosis not present

## 2017-05-09 DIAGNOSIS — G894 Chronic pain syndrome: Secondary | ICD-10-CM | POA: Diagnosis not present

## 2017-05-09 DIAGNOSIS — M797 Fibromyalgia: Secondary | ICD-10-CM | POA: Diagnosis not present

## 2017-05-09 DIAGNOSIS — Z6835 Body mass index (BMI) 35.0-35.9, adult: Secondary | ICD-10-CM | POA: Diagnosis not present

## 2017-05-20 ENCOUNTER — Encounter (INDEPENDENT_AMBULATORY_CARE_PROVIDER_SITE_OTHER): Payer: Self-pay | Admitting: Specialist

## 2017-05-20 MED ORDER — BUPIVACAINE HCL 0.5 % IJ SOLN
0.5000 mL | INTRAMUSCULAR | Status: AC | PRN
  Administered 2017-03-25: .5 mL

## 2017-05-20 MED ORDER — METHYLPREDNISOLONE ACETATE 40 MG/ML IJ SUSP
20.0000 mg | INTRAMUSCULAR | Status: AC | PRN
  Administered 2017-03-25: 20 mg

## 2017-05-21 ENCOUNTER — Ambulatory Visit: Payer: PPO | Admitting: Nutrition

## 2017-05-23 NOTE — Progress Notes (Signed)
Office Visit Note  Patient: Debbie Pineda             Date of Birth: 1960/06/12           MRN: [id number]             PCP: Jake Samples, PA-C Referring: Jake Samples, [place]* Visit Date: 06/06/2017 Occupation: @GUAROCC @    Subjective:  Pain in hands.   History of Present Illness: Debbie Pineda is a 57 y.o. female with history of fibromyalgia and osteoarthritis.  She continues to have pain and discomfort in her right CMC joint.  She states she was seen by Dr. Fredna Dow who is given her a [place] brace.  She continues to have some discomfort in her right knee.  She has bilateral total knee replacement.  She continues to have lower back pain.  Has a stimulator placed for her lower back pain.  She was also diagnosed with diabetes recently.  She reports that she had a recent seizure.  She has been seen by neurologist and is undergoing evaluation currently.  Activities of Daily Living:  Patient reports morning stiffness for 30 minutes.   Patient Reports nocturnal pain.  Difficulty dressing/grooming: Denies Difficulty climbing stairs: Reports Difficulty getting out of chair: Denies Difficulty using hands for taps, buttons, cutlery, and/or writing: Denies   Review of Systems  Constitutional: Positive for fatigue. Negative for night sweats, weight gain and weight loss.  HENT: Negative for mouth sores, trouble swallowing, trouble swallowing, mouth dryness and nose dryness.   Eyes: Negative for pain, redness, visual disturbance and dryness.  Respiratory: Negative for cough, shortness of breath and difficulty breathing.   Cardiovascular: Negative for chest pain, palpitations, hypertension, irregular heartbeat and swelling in legs/feet.  Gastrointestinal: Negative for blood in stool, constipation and diarrhea.  Endocrine: Negative for increased urination.  Genitourinary: Negative for vaginal dryness.  Musculoskeletal: Positive for arthralgias, joint pain, myalgias, morning stiffness  and myalgias. Negative for joint swelling, muscle weakness and muscle tenderness.  Skin: Negative for color change, rash, hair loss, skin tightness, ulcers and sensitivity to sunlight.  Allergic/Immunologic: Negative for susceptible to infections.  Neurological: Positive for seizures and headaches. Negative for dizziness, memory loss, night sweats and weakness.  Hematological: Negative for swollen glands.  Psychiatric/Behavioral: Negative for depressed mood and sleep disturbance. The patient is not nervous/anxious.     PMFS History:  Patient Active Problem List   Diagnosis Date Noted  . History of diabetes mellitus 06/06/2017  . Primary osteoarthritis of right hand 06/06/2017  . Transaminasemia   . TIA (transient ischemic attack) 05/01/2017  . Dysphasia 05/01/2017  . Chronic pain syndrome 05/01/2017  . Confusion   . Presence of right artificial knee joint 09/17/2016  . H/O total knee replacement, right 08/15/2016  . Presence of retained hardware   . Memory disorder 08/14/2016  . Cerebrovascular disease 08/14/2016  . Unilateral primary osteoarthritis, right knee 05/29/2016  . Primary osteoarthritis of both feet 05/29/2016  . Trochanteric bursitis of both hips 05/25/2016  . Neck pain 05/25/2016  . Urinary urgency 04/19/2016  . Chronic low back pain 01/11/2016  . Depression 11/29/2015  . 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 Heart palpitations 12/14/2013  . Generalized osteoarthritis of multiple sites 11/04/2013  . Cerebral infarction ([place]) 10/30/2011  . PFO (patent foramen ovale) 10/30/2011  . Bradycardia 10/30/2011  . Chest pain 10/30/2011  . Hypothyroid   . Thrombophlebitis   . Sleep apnea   . Fibromyalgia   . History  of gastric bypass, 11/20/2010 12/07/2010  . Status post bariatric surgery 12/07/2010  . Vein disorder 11/29/2010  . S/P total hysterectomy and bilateral salpingo-oophorectomy 11/29/2010  . S/P exploratory laparotomy 11/29/2010  . S/P cholecystectomy  11/29/2010  . S/P ACL surgery 11/29/2010  . Morbid obesity ([place]) 11/10/2010    Past Medical History:  Diagnosis Date  . ADD (attention deficit disorder)    takes Adderall daily  . Arthritis    "all over"  . Cerebral infarction ([place]) 10/30/2011  . Cerebrovascular disease 08/14/2016  . Chronic back pain    "all over"  . Chronic low back pain 01/11/2016  . Complication of anesthesia    tends to have hypotension when NPO and post-anesthesia  . Constipation    takes stool softener daily  . Degenerative disk disease   . Degenerative joint disease   . Depression    takes Cymbalta daily for pain per pt  . Diabetes mellitus without complication ([place])   . DVT (deep venous thrombosis) ([facility])    RLE  . Family history of adverse reaction to anesthesia    a family member woke up during surgery; "think it was my mom"  . Fibromyalgia   . Generalized osteoarthritis of multiple sites 11/04/2013  . GERD (gastroesophageal reflux disease)   . Heart palpitations 12/14/2013  . History of blood clots    superficial  . Hypoglycemia   . Hypothyroid    takes Synthroid daily  . Incomplete emptying of bladder   . Insomnia    takes Trazodone nightly  . Iron deficiency anemia    takes Ferrous Sulfate daily  . Joint pain   . Joint swelling    knees and ankles  . Memory disorder 08/14/2016  . Morbid obesity ([place])   . Nausea    takes Zofran as needed.Seeing GI doc  . Neck pain 05/25/2016  . OSA on CPAP    tested more than 5 yrs ago.    . Osteoarthritis   . PFO (patent foramen ovale)   . Primary osteoarthritis of both feet 05/29/2016   Right bunionectomy August 2017 by Dr. Sharol Given  . Scoliosis   . Sleep apnea   . Stroke [facility]) "several"   right foot weakness; memory issues, black spot right visual field since" (03/23/2015)  . Thrombophlebitis   . Trochanteric bursitis of both hips 05/25/2016  . Unilateral primary osteoarthritis, right knee 05/29/2016  . Urinary urgency 04/19/2016  . Vein disorder 11/29/2010     Family History  Problem Relation Age of Onset  . Heart disease Father   . Cancer Father   . Cancer Mother        skin cancer   . Heart disease Brother   . Cancer Brother   . Diabetes Brother   . Stroke Brother   . Heart disease Sister   . Cancer Maternal Grandfather   . Hypothyroidism Daughter   . Hypertension Other   . Colon cancer Neg Hx    Past Surgical History:  Procedure Laterality Date  . BUNIONECTOMY Right 08/2015  . CARDIAC CATHETERIZATION     2008.  "it was fine" (not sure why she had it done, and doesn't know where)  . COLONOSCOPY N/A 03/25/2013   Procedure: COLONOSCOPY;  Surgeon: Rogene Houston, MD;  [facility];  Service: Endoscopy;  Laterality: N/A;  930  . ESOPHAGOGASTRODUODENOSCOPY    . EXPLORATORY LAPAROTOMY     "took fallopian tubes out"  . JOINT REPLACEMENT     bil  knee   . KNEE ARTHROPLASTY    . KNEE ARTHROSCOPY Left   . KNEE ARTHROSCOPY W/ ACL RECONSTRUCTION Right    "added pins"  . LAPAROSCOPIC CHOLECYSTECTOMY  ~ 2001  . 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 SPINAL CORD STIMULATOR INSERTION N/A 04/18/2017   Procedure: LUMBAR SPINAL CORD STIMULATOR INSERTION;  Surgeon: Clydell Hakim, MD;  Location: [place];  Service: Neurosurgery;  Laterality: N/A;  LUMBAR SPINAL CORD STIMULATOR INSERTION  . TOTAL KNEE ARTHROPLASTY Left 03/23/2015   Procedure: TOTAL KNEE ARTHROPLASTY;  Surgeon: Newt Minion, MD;  Location: [place];  Service: Orthopedics;  Laterality: Left;  . TOTAL KNEE ARTHROPLASTY Right 08/15/2016   Procedure: RIGHT TOTAL KNEE ARTHROPLASTY, REMOVAL ACL SCREWS;  Surgeon: Newt Minion, MD;  Location: [place];  Service: Orthopedics;  Laterality: Right;  . TOTAL KNEE ARTHROPLASTY WITH HARDWARE REMOVAL Right   . VAGINAL HYSTERECTOMY    . VARICOSE VEIN SURGERY Right X 2   Social History   Social History Narrative   Lives with husband   Caffeine use: No soda   Mainly water, drinks decaf tea   Right handed     Objective: Vital Signs: BP  (!) 95/53 (BP Location: Left Arm, Patient Position: Sitting, Cuff Size: Large)   Pulse 76   Resp 12   Ht 5\' 5"  (1.651 m)   Wt 211 lb (95.7 kg)   BMI 35.11 kg/m    Physical Exam  Constitutional: She is oriented to person, place, and time. She appears well-developed and well-nourished.  HENT:  Head: Normocephalic and atraumatic.  Eyes: Conjunctivae and EOM are normal.  Neck: Normal range of motion.  Cardiovascular: Normal rate, regular rhythm, normal heart sounds and intact distal pulses.  Pulmonary/Chest: Effort normal and breath sounds normal.  Abdominal: Soft. Bowel sounds are normal.  Lymphadenopathy:    She has no cervical adenopathy.  Neurological: She is alert and oriented to person, place, and time.  Skin: Skin is warm and dry. Capillary refill takes less than 2 seconds.  Psychiatric: She has a normal mood and affect. Her behavior is normal.  Nursing note and vitals reviewed.    Musculoskeletal Exam: C-spine, thoracic and lumbar spine limited range of motion.  Shoulder joints elbow joints wrist joint MCPs PIPs DIPs were in good range of motion.  She has DIP PIP thickening in her hands and feet consistent with osteoarthritis.  She has tenderness over bilateral trochanteric bursa more so on the left than the right side.  She continues to have discomfort in her bilateral knees despite the replacement.  CDAI Exam: No CDAI exam completed.    Investigation: No additional findings. CBC Latest Ref Rng & Units 05/01/2017 05/01/2017 04/11/2017  WBC 4.0 - 10.5 K/uL - 8.5 9.6  Hemoglobin 12.0 - 15.0 g/dL 12.9 13.0 14.5  Hematocrit 36.0 - 46.0 % 38.0 40.2 43.6  Platelets 150 - 400 K/uL - 230 232   CMP Latest Ref Rng & Units 05/03/2017 05/02/2017 05/01/2017  Glucose 65 - 99 mg/dL - 89 60(L)  BUN 6 - 20 mg/dL - 16 17  Creatinine 0.44 - 1.00 mg/dL - 0.79 1.00  Sodium 135 - 145 mmol/L - 142 140  Potassium 3.5 - 5.1 mmol/L - 4.1 3.6  Chloride 101 - 111 mmol/L - 108 101  CO2 22 - 32 mmol/L  - 26 -  Calcium 8.9 - 10.3 mg/dL - 8.8(L) -  Total Protein 6.5 - 8.1 g/dL 6.5 5.8(L) -  Total Bilirubin 0.3 - 1.2 mg/dL  0.4 0.3 -  Alkaline Phos 38 - 126 U/L 123 120 -  AST 15 - 41 U/L 51(H) 104(H) -  ALT 14 - 54 U/L 120(H) 149(H) -    Imaging: No results found.  Speciality Comments: No specialty comments available.    Procedures:  Large Joint Inj: L greater trochanter on 06/06/2017 1:50 PM Indications: pain Details: 27 G 1.5 in needle, lateral approach  Arthrogram: No  Medications: 1.5 mL lidocaine (PF) 1 %; 40 mg triamcinolone acetonide 40 MG/ML Aspirate: 0 mL Outcome: tolerated well, no immediate complications Procedure, treatment alternatives, risks and benefits explained, specific risks discussed. Consent was given by the patient. Immediately prior to procedure a time out was called to verify the correct patient, procedure, equipment, support staff and site/side marked as required. Patient was prepped and draped in the usual sterile fashion.     Allergies: Lyrica [pregabalin]; Flexeril [cyclobenzaprine hcl]; Morphine and related; Sulfamethoxazole-trimethoprim; Belsomra [suvorexant]; and Tape   Assessment / Plan:     Visit Diagnoses: Fibromyalgia-she continues to have some generalized pain discomfort and positive tender points.  Primary osteoarthritis of right hand-she has CMC joint discomfort.  She has been using a CMC brace.  Trochanteric bursitis of both hips-she had tenderness over bilateral trochanteric bursa more severe on the left side.  Per her request left trochanteric bursa was injected with cortisone.  I made her aware that it can raise her blood pressure and blood sugar.  She will have to monitor blood sugar closely.  History of total bilateral knee replacement-she continues to have discomfort in her bilateral knee joints despite having total knee replacement.  Primary osteoarthritis of both feet - Right bunionectomy August 2017 by Dr. Sharol Given   Chronic midline  low back pain without sciatica - She's been followed by Dr. Louanne Skye..  She had recent a stimulator placed.  Other medical problems are listed as follows:  PFO (patent foramen ovale)  History of sleep apnea  History of gastric bypass - 11/20/2010   History of cerebral infarction  History of thrombophlebitis  History of diabetes mellitus    Orders: No orders of the defined types were placed in this encounter.  No orders of the defined types were placed in this encounter.   Face-to-face time spent with patient was 30 minutes. >50% of time was spent in counseling and coordination of care.  Follow-Up Instructions: Return in about 6 months (around 12/07/2017) for FMS, OA.   Bo Merino, MD  Note - This record has been created using Editor, commissioning.  Chart creation errors have been sought, but may not always  have been located. Such creation errors do not reflect on  the standard of medical care.

## 2017-05-24 DIAGNOSIS — G4733 Obstructive sleep apnea (adult) (pediatric): Secondary | ICD-10-CM | POA: Diagnosis not present

## 2017-05-27 ENCOUNTER — Encounter: Payer: Self-pay | Admitting: Registered"

## 2017-05-27 ENCOUNTER — Encounter: Payer: PPO | Attending: Family Medicine | Admitting: Registered"

## 2017-05-27 DIAGNOSIS — Z713 Dietary counseling and surveillance: Secondary | ICD-10-CM | POA: Diagnosis not present

## 2017-05-27 DIAGNOSIS — Z9884 Bariatric surgery status: Secondary | ICD-10-CM | POA: Insufficient documentation

## 2017-05-27 DIAGNOSIS — E119 Type 2 diabetes mellitus without complications: Secondary | ICD-10-CM | POA: Insufficient documentation

## 2017-05-27 NOTE — Progress Notes (Signed)
Diabetes Self-Management Education  Visit Type: First/Initial  Appt. Start Time: 2:15 Appt. End Time: 3:30  05/27/2017  [name], identified by name and date of birth, is a 57 y.o. female with a diagnosis of Diabetes: Type 2.   Pt expectations: eating with diabetes  ASSESSMENT Pt arrives hypoglycemic: BS 65. Pt states she checked her BS an hour prior and it was 226. Pt states she checks her BS 1-4 times a day: FBS (90-120) and after meals (150-160). Pt was given 4 glucose tablets to take to increase BS. Pt checked BS 15 minutes later (161). Pt states she wanted to continue with the appointment. Pt states she had a headache and experienced some blurriness. Pt states she was fine throughout the appt when I would ask. 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 Pt states she has been staying with her mom lately and some days she is not able to check her BS during the day. Pt reports having gastric bypass around 6 years ago. Pt states she has had 2-3 strokes (each time greater than 1 stroke happening at once), fibromyalgia, hypothyroidism, and gastric bypass. Pt states she has family history of diabetes and has been watching her BS numbers for a while. Pt states some family members have had amputations which makes her motivated to change with recent diabetes diagnosis.   Next visit: physical activity, stress management, support group, chronic complications, association with stroke and diabetes  There were no vitals taken for this visit. There is no height or weight on file to calculate BMI.  Diabetes Self-Management Education - 05/27/17 1424      Visit Information   Visit Type  First/Initial      Initial Visit   Diabetes Type  Type 2    Are you currently following a meal plan?  No    Are you taking your medications as prescribed?  Not on Medications    Date Diagnosed  04/12/2017      Health Coping   How would you  rate your overall health?  Fair      Psychosocial Assessment   Patient Belief/Attitude about Diabetes  Afraid also motivated to change    Self-care barriers  Other (comment) Pt states she is currently taking care of her mother which hinders her from checking blood sugar as often as she would like. Pt states she will go a few days with out checking it at times.    Other persons present  Patient    Special Needs  None    Preferred Learning Style  No preference indicated    Learning Readiness  Ready    How often do you need to have someone help you when you read instructions, pamphlets, or other written materials from your doctor or pharmacy?  1 - Never    What is the last grade level you completed in school?  Associates degree      Complications   Last HgB A1C per patient/outside source  5.4 %    How often do you check your blood sugar?  1-2 times/day    Fasting Blood glucose range (mg/dL)  70-129    Postprandial Blood glucose range (mg/dL)  130-179    Number of hypoglycemic episodes per month  4    Can you tell when your blood sugar is low?  Yes    What do you do if your blood sugar is low?  sit down and eats something    Number of hyperglycemic episodes per week  1    Can you tell when your blood sugar is high?  Yes    What do you do if your blood sugar is high?  drink water    Have you had a dilated eye exam in the past 12 months?  Yes    Have you had a dental exam in the past 12 months?  Yes    Are you checking your feet?  Yes    How many days per week are you checking your feet?  7      Dietary Intake   Breakfast  2 [place] franks, colby jack cheese  (Pended)     Snack (morning)  sometimes cheese or frozen strawberries + almond milk + stevia  (Pended)     Lunch  peanut butter, banana sandwich on whole wheat bread  (Pended)     Snack (afternoon)  sometimes cheese or frozen strawberries + almond milk + stevia  (Pended)     Dinner  hamburger steak, peppers, onions or green beans,  mashed potatoes or Wendy-apple pecan chicken salad  (Pended)     Beverage(s)  water, decaf tea with small amount of sugar, sweet tea when eating out  (Pended)       Exercise   Exercise Type  ADL's  (Pended)     How many days per week to you exercise?  0    How many minutes per day do you exercise?  0    Total minutes per week of exercise  0       Individualized Plan for Diabetes Self-Management Training:   Learning Objective:  Patient will have a greater understanding of diabetes self-management. Patient education plan is to attend individual and/or group sessions per assessed needs and concerns.   Plan:   Patient Instructions  Goals:  Follow Diabetes Meal Plan as instructed  Eat 3 meals and 2 snacks, every 3-5 hrs  Limit carbohydrate intake to 30-45 grams carbohydrate/meal  Limit carbohydrate intake to 15 grams carbohydrate/snack  Add lean protein foods to meals/snacks  Monitor glucose levels as instructed by your doctor  Bring food record and glucose log to your next nutrition visit  -Always pay attention to your body keeping watchful of possible low blood sugar (below 70) or high blood sugar (above 200)  -Check your feet every day looking for anything that was not there the day before  - Check blood sugar fasting and 2 hrs after each meal (4 times/day). Record with meal information.       Expected Outcomes:     Education material provided: Living Well with Diabetes, Food label handouts and Carbohydrate counting sheet  If problems or questions, patient to contact team via:  Phone and Email  Future DSME appointment:  1 month

## 2017-05-27 NOTE — Patient Instructions (Addendum)
Goals:  Follow Diabetes Meal Plan as instructed  Eat 3 meals and 2 snacks, every 3-5 hrs  Limit carbohydrate intake to 30-45 grams carbohydrate/meal  Limit carbohydrate intake to 15 grams carbohydrate/snack  Add lean protein foods to meals/snacks  Monitor glucose levels as instructed by your doctor  Bring food record and glucose log to your next nutrition visit  -Always pay attention to your body keeping watchful of possible low blood sugar (below 70) or high blood sugar (above 200)  -Check your feet every day looking for anything that was not there the day before  - Check blood sugar fasting and 2 hrs after each meal (4 times/day). Record with meal information.

## 2017-05-28 ENCOUNTER — Ambulatory Visit: Payer: PPO | Admitting: Cardiovascular Disease

## 2017-05-28 DIAGNOSIS — G894 Chronic pain syndrome: Secondary | ICD-10-CM | POA: Diagnosis not present

## 2017-06-06 ENCOUNTER — Encounter: Payer: Self-pay | Admitting: Rheumatology

## 2017-06-06 ENCOUNTER — Ambulatory Visit: Payer: PPO | Admitting: Rheumatology

## 2017-06-06 VITALS — BP 95/53 | HR 76 | Resp 12 | Ht 65.0 in | Wt 211.0 lb

## 2017-06-06 DIAGNOSIS — M545 Low back pain, unspecified: Secondary | ICD-10-CM

## 2017-06-06 DIAGNOSIS — M19071 Primary osteoarthritis, right ankle and foot: Secondary | ICD-10-CM | POA: Diagnosis not present

## 2017-06-06 DIAGNOSIS — Z8639 Personal history of other endocrine, nutritional and metabolic disease: Secondary | ICD-10-CM

## 2017-06-06 DIAGNOSIS — M7061 Trochanteric bursitis, right hip: Secondary | ICD-10-CM | POA: Diagnosis not present

## 2017-06-06 DIAGNOSIS — Z9884 Bariatric surgery status: Secondary | ICD-10-CM

## 2017-06-06 DIAGNOSIS — M797 Fibromyalgia: Secondary | ICD-10-CM

## 2017-06-06 DIAGNOSIS — Z96653 Presence of artificial knee joint, bilateral: Secondary | ICD-10-CM | POA: Diagnosis not present

## 2017-06-06 DIAGNOSIS — M7062 Trochanteric bursitis, left hip: Secondary | ICD-10-CM

## 2017-06-06 DIAGNOSIS — Q211 Atrial septal defect: Secondary | ICD-10-CM | POA: Diagnosis not present

## 2017-06-06 DIAGNOSIS — Z8673 Personal history of transient ischemic attack (TIA), and cerebral infarction without residual deficits: Secondary | ICD-10-CM | POA: Diagnosis not present

## 2017-06-06 DIAGNOSIS — Z8669 Personal history of other diseases of the nervous system and sense organs: Secondary | ICD-10-CM | POA: Diagnosis not present

## 2017-06-06 DIAGNOSIS — M19041 Primary osteoarthritis, right hand: Secondary | ICD-10-CM

## 2017-06-06 DIAGNOSIS — Z8672 Personal history of thrombophlebitis: Secondary | ICD-10-CM | POA: Diagnosis not present

## 2017-06-06 DIAGNOSIS — Q2112 Patent foramen ovale: Secondary | ICD-10-CM

## 2017-06-06 DIAGNOSIS — M19072 Primary osteoarthritis, left ankle and foot: Secondary | ICD-10-CM

## 2017-06-06 DIAGNOSIS — G8929 Other chronic pain: Secondary | ICD-10-CM

## 2017-06-06 MED ORDER — LIDOCAINE HCL (PF) 1 % IJ SOLN
1.5000 mL | INTRAMUSCULAR | Status: AC | PRN
Start: 2017-06-06 — End: 2017-06-06
  Administered 2017-06-06: 1.5 mL

## 2017-06-06 MED ORDER — TRIAMCINOLONE ACETONIDE 40 MG/ML IJ SUSP
40.0000 mg | INTRAMUSCULAR | Status: AC | PRN
  Administered 2017-06-06: 40 mg via INTRA_ARTICULAR

## 2017-06-07 DIAGNOSIS — E162 Hypoglycemia, unspecified: Secondary | ICD-10-CM | POA: Diagnosis not present

## 2017-06-07 DIAGNOSIS — Z1389 Encounter for screening for other disorder: Secondary | ICD-10-CM | POA: Diagnosis not present

## 2017-06-07 DIAGNOSIS — E6609 Other obesity due to excess calories: Secondary | ICD-10-CM | POA: Diagnosis not present

## 2017-06-07 DIAGNOSIS — Z6834 Body mass index (BMI) 34.0-34.9, adult: Secondary | ICD-10-CM | POA: Diagnosis not present

## 2017-06-07 DIAGNOSIS — Z9884 Bariatric surgery status: Secondary | ICD-10-CM | POA: Diagnosis not present

## 2017-06-07 DIAGNOSIS — G894 Chronic pain syndrome: Secondary | ICD-10-CM | POA: Diagnosis not present

## 2017-06-07 DIAGNOSIS — M797 Fibromyalgia: Secondary | ICD-10-CM | POA: Diagnosis not present

## 2017-06-12 DIAGNOSIS — N8110 Cystocele, unspecified: Secondary | ICD-10-CM | POA: Diagnosis not present

## 2017-06-12 DIAGNOSIS — N941 Unspecified dyspareunia: Secondary | ICD-10-CM | POA: Diagnosis not present

## 2017-06-12 DIAGNOSIS — R3915 Urgency of urination: Secondary | ICD-10-CM | POA: Diagnosis not present

## 2017-06-12 DIAGNOSIS — M1811 Unilateral primary osteoarthritis of first carpometacarpal joint, right hand: Secondary | ICD-10-CM | POA: Diagnosis not present

## 2017-06-12 DIAGNOSIS — N952 Postmenopausal atrophic vaginitis: Secondary | ICD-10-CM | POA: Diagnosis not present

## 2017-06-13 ENCOUNTER — Encounter (INDEPENDENT_AMBULATORY_CARE_PROVIDER_SITE_OTHER): Payer: Self-pay | Admitting: Specialist

## 2017-06-13 ENCOUNTER — Ambulatory Visit (INDEPENDENT_AMBULATORY_CARE_PROVIDER_SITE_OTHER): Payer: PPO | Admitting: Specialist

## 2017-06-13 VITALS — BP 110/68 | HR 97 | Ht 65.0 in | Wt 210.0 lb

## 2017-06-13 DIAGNOSIS — M5136 Other intervertebral disc degeneration, lumbar region: Secondary | ICD-10-CM | POA: Diagnosis not present

## 2017-06-13 DIAGNOSIS — Z6834 Body mass index (BMI) 34.0-34.9, adult: Secondary | ICD-10-CM | POA: Diagnosis not present

## 2017-06-13 DIAGNOSIS — M1611 Unilateral primary osteoarthritis, right hip: Secondary | ICD-10-CM

## 2017-06-13 DIAGNOSIS — M4726 Other spondylosis with radiculopathy, lumbar region: Secondary | ICD-10-CM | POA: Diagnosis not present

## 2017-06-13 DIAGNOSIS — J019 Acute sinusitis, unspecified: Secondary | ICD-10-CM | POA: Diagnosis not present

## 2017-06-13 DIAGNOSIS — M4155 Other secondary scoliosis, thoracolumbar region: Secondary | ICD-10-CM | POA: Diagnosis not present

## 2017-06-13 DIAGNOSIS — G894 Chronic pain syndrome: Secondary | ICD-10-CM | POA: Diagnosis not present

## 2017-06-13 DIAGNOSIS — Z96653 Presence of artificial knee joint, bilateral: Secondary | ICD-10-CM | POA: Diagnosis not present

## 2017-06-13 DIAGNOSIS — F325 Major depressive disorder, single episode, in full remission: Secondary | ICD-10-CM | POA: Diagnosis not present

## 2017-06-13 DIAGNOSIS — M1711 Unilateral primary osteoarthritis, right knee: Secondary | ICD-10-CM | POA: Diagnosis not present

## 2017-06-13 DIAGNOSIS — E6609 Other obesity due to excess calories: Secondary | ICD-10-CM | POA: Diagnosis not present

## 2017-06-13 DIAGNOSIS — Z0001 Encounter for general adult medical examination with abnormal findings: Secondary | ICD-10-CM | POA: Diagnosis not present

## 2017-06-13 DIAGNOSIS — Z1389 Encounter for screening for other disorder: Secondary | ICD-10-CM | POA: Diagnosis not present

## 2017-06-13 DIAGNOSIS — E063 Autoimmune thyroiditis: Secondary | ICD-10-CM | POA: Diagnosis not present

## 2017-06-13 DIAGNOSIS — G4730 Sleep apnea, unspecified: Secondary | ICD-10-CM | POA: Diagnosis not present

## 2017-06-13 NOTE — Patient Instructions (Signed)
Avoid bending, stooping and avoid lifting weights greater than 10 lbs. Avoid prolong standing and walking. Avoid frequent bending and stooping  No lifting greater than 10 lbs. May use ice or moist heat for pain. Weight loss is of benefit. Handicap license is approved. Dr. Romona Curls secretary/Assistant will call to arrange for right hip marcaine steroid injection

## 2017-06-13 NOTE — Progress Notes (Signed)
Office Visit Note   Patient: Debbie Pineda           Date of Birth: [birth year]           MRN: [id number] Visit Date: 06/13/2017              Requested by: Jake Samples, PA-C [address] [place], [place] [address] PCP: Jake Samples, PA-C   Assessment & Plan: Visit Diagnoses:  1. Unilateral primary osteoarthritis, right hip   2. Other secondary scoliosis, thoracolumbar region   3. Degenerative disc disease, lumbar   4. Other spondylosis with radiculopathy, lumbar region   5. Status post bilateral knee replacements   6. Chronic pain syndrome     Plan:Avoid bending, stooping and avoid lifting weights greater than 10 lbs. Avoid prolong standing and walking. Avoid frequent bending and stooping  No lifting greater than 10 lbs. May use ice or moist heat for pain. Weight loss is of benefit. Handicap license is approved. Dr. Romona Curls secretary/Assistant will call to arrange for right hip marcaine steroid injection   Follow-Up Instructions: No follow-ups on file.   Orders:  Orders Placed This Encounter  Procedures  . Ambulatory referral to Physical Medicine Rehab   No orders of the defined types were placed in this encounter.     Procedures: No procedures performed   Clinical Data: No additional findings.   Subjective: Chief Complaint  Patient presents with  . Right Hand - Follow-up    She is seeing Dr. Fredna Dow for her hand now.    57 year old female with history of lumbar spondylosis and degenerative disc disease she has had a previous lumbar     Review of Systems   Objective: Vital Signs: BP 110/68 (BP Location: Left Arm, Patient Position: Sitting)   Pulse 97   Ht 5\' 5"  (1.651 m)   Wt 210 lb (95.3 kg)   BMI 34.95 kg/m   Physical Exam  Ortho Exam  Specialty Comments:  No specialty comments available.  Imaging: No results found.   PMFS History: Patient Active Problem List   Diagnosis Date Noted  . History of diabetes  mellitus 06/06/2017  . Primary osteoarthritis of right hand 06/06/2017  . Transaminasemia   . TIA (transient ischemic attack) 05/01/2017  . Dysphasia 05/01/2017  . Chronic pain syndrome 05/01/2017  . Confusion   . Presence of right artificial knee joint 09/17/2016  . H/O total knee replacement, right 08/15/2016  . Presence of retained hardware   . Memory disorder 08/14/2016  . Cerebrovascular disease 08/14/2016  . Unilateral primary osteoarthritis, right knee 05/29/2016  . Primary osteoarthritis of both feet 05/29/2016  . Trochanteric bursitis of both hips 05/25/2016  . Neck pain 05/25/2016  . Urinary urgency 04/19/2016  . Chronic low back pain 01/11/2016  . Depression 11/29/2015  . 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 Heart palpitations 12/14/2013  . Generalized osteoarthritis of multiple sites 11/04/2013  . Cerebral infarction ([place]) 10/30/2011  . PFO (patent foramen ovale) 10/30/2011  . Bradycardia 10/30/2011  . Chest pain 10/30/2011  . Hypothyroid   . Thrombophlebitis   . Sleep apnea   . Fibromyalgia   . 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 Status post bariatric surgery 12/07/2010  . Vein disorder 11/29/2010  . S/P total hysterectomy and bilateral salpingo-oophorectomy 11/29/2010  . S/P exploratory laparotomy 11/29/2010  . S/P cholecystectomy 11/29/2010  . S/P ACL surgery 11/29/2010  . Morbid obesity ([place]) 11/10/2010   Past Medical History:  Diagnosis Date  . ADD (attention deficit disorder)    takes Adderall daily  . Arthritis    "all over"  . Cerebral infarction ([place]) 10/30/2011  . Cerebrovascular disease 08/14/2016  . Chronic back pain    "all over"  . Chronic low back pain 01/11/2016  . Complication of anesthesia    tends to have hypotension when NPO and post-anesthesia  . Constipation    takes stool softener daily  . Degenerative disk disease   . Degenerative joint disease   . Depression    takes Cymbalta daily for pain per pt  .  Diabetes mellitus without complication ([place])   . DVT (deep venous thrombosis) ([facility])    RLE  . Family history of adverse reaction to anesthesia    a family member woke up during surgery; "think it was my mom"  . Fibromyalgia   . Generalized osteoarthritis of multiple sites 11/04/2013  . GERD (gastroesophageal reflux disease)   . Heart palpitations 12/14/2013  . History of blood clots    superficial  . Hypoglycemia   . Hypothyroid    takes Synthroid daily  . Incomplete emptying of bladder   . Insomnia    takes Trazodone nightly  . Iron deficiency anemia    takes Ferrous Sulfate daily  . Joint pain   . Joint swelling    knees and ankles  . Memory disorder 08/14/2016  . Morbid obesity ([place])   . Nausea    takes Zofran as needed.Seeing GI doc  . Neck pain 05/25/2016  . OSA on CPAP    tested more than 5 yrs ago.    . Osteoarthritis   . PFO (patent foramen ovale)   . Primary osteoarthritis of both feet 05/29/2016   Right bunionectomy August 2017 by Dr. Sharol Given  . Scoliosis   . Sleep apnea   . Stroke [facility]) "several"   right foot weakness; memory issues, black spot right visual field since" (03/23/2015)  . Thrombophlebitis   . Trochanteric bursitis of both hips 05/25/2016  . Unilateral primary osteoarthritis, right knee 05/29/2016  . Urinary urgency 04/19/2016  . Vein disorder 11/29/2010    Family History  Problem Relation Age of Onset  . Heart disease Father   . Cancer Father   . Cancer Mother        skin cancer   . Heart disease Brother   . Cancer Brother   . Diabetes Brother   . Stroke Brother   . Heart disease Sister   . Cancer Maternal Grandfather   . Hypothyroidism Daughter   . Hypertension Other   . Colon cancer Neg Hx     Past Surgical History:  Procedure Laterality Date  . BUNIONECTOMY Right 08/2015  . CARDIAC CATHETERIZATION     2008.  "it was fine" (not sure why she had it done, and doesn't know where)  . COLONOSCOPY N/A 03/25/2013   Procedure: COLONOSCOPY;  Surgeon:  Rogene Houston, MD;  [facility];  Service: Endoscopy;  Laterality: N/A;  930  . ESOPHAGOGASTRODUODENOSCOPY    . EXPLORATORY LAPAROTOMY     "took fallopian tubes out"  . JOINT REPLACEMENT     bil knee   . KNEE ARTHROPLASTY    . KNEE ARTHROSCOPY Left   . KNEE ARTHROSCOPY W/ ACL RECONSTRUCTION Right    "added pins"  . LAPAROSCOPIC CHOLECYSTECTOMY  ~ 2001  . 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 SPINAL CORD STIMULATOR INSERTION N/A 04/18/2017   Procedure: LUMBAR SPINAL CORD  STIMULATOR INSERTION;  Surgeon: Clydell Hakim, MD;  Location: [place];  Service: Neurosurgery;  Laterality: N/A;  LUMBAR SPINAL CORD STIMULATOR INSERTION  . TOTAL KNEE ARTHROPLASTY Left 03/23/2015   Procedure: TOTAL KNEE ARTHROPLASTY;  Surgeon: Newt Minion, MD;  Location: [place];  Service: Orthopedics;  Laterality: Left;  . TOTAL KNEE ARTHROPLASTY Right 08/15/2016   Procedure: RIGHT TOTAL KNEE ARTHROPLASTY, REMOVAL ACL SCREWS;  Surgeon: Newt Minion, MD;  Location: [place];  Service: Orthopedics;  Laterality: Right;  . TOTAL KNEE ARTHROPLASTY WITH HARDWARE REMOVAL Right   . VAGINAL HYSTERECTOMY    . VARICOSE VEIN SURGERY Right X 2   Social History   Occupational History  . Occupation: Disability  . Occupation: formerly Therapist, sports, [employer]  Tobacco Use  . Smoking status: Former Smoker    Packs/day: 0.75    Years: 8.00    Pack years: 6.00    Types: Cigarettes    Last attempt to quit: 12/01/1990    Years since quitting: 26.5  . Smokeless tobacco: Never Used  . Tobacco comment: quit smoking in the 1990s  Substance and Sexual Activity  . Alcohol use: No    Comment: 03/23/2015 "stopped drinking in 2012 w/gastric bypass; drank socially before bypass"  . Drug use: No  . Sexual activity: Not Currently    Birth control/protection: Surgical

## 2017-06-20 ENCOUNTER — Ambulatory Visit: Payer: PPO | Admitting: Cardiovascular Disease

## 2017-06-20 ENCOUNTER — Encounter: Payer: Self-pay | Admitting: Cardiovascular Disease

## 2017-06-20 VITALS — BP 116/70 | HR 71 | Ht 65.0 in | Wt 210.0 lb

## 2017-06-20 DIAGNOSIS — Q211 Atrial septal defect: Secondary | ICD-10-CM

## 2017-06-20 DIAGNOSIS — I639 Cerebral infarction, unspecified: Secondary | ICD-10-CM

## 2017-06-20 DIAGNOSIS — I8393 Asymptomatic varicose veins of bilateral lower extremities: Secondary | ICD-10-CM

## 2017-06-20 DIAGNOSIS — Q2112 Patent foramen ovale: Secondary | ICD-10-CM

## 2017-06-20 DIAGNOSIS — Z0000 Encounter for general adult medical examination without abnormal findings: Secondary | ICD-10-CM | POA: Diagnosis not present

## 2017-06-20 NOTE — Progress Notes (Signed)
CARDIOLOGY CONSULT NOTE  Patient ID: [name] MRN: [id number] DOB/AGE: [birth year] 57 y.o.  Admit date: (Not on file) Primary Physician: [name] Referring Physician: Shade Flood, MD  Reason for Consultation: PFO  HPI: Debbie Pineda is a 57 y.o. female who is being seen today for the evaluation of PFO at the request of Shade Flood, MD.  She has a complex past medical history which includes history of recurrent stroke with residual right hemianopsia in 2013, PFO, DVT, factor V Leiden deficiency (although negative on 08/14/2016), chronic pain syndrome, and GERD.  She has a history of PFO and is on Xarelto.  This was seen by [facility] at [facility] on 12/06/2011.  It was not seen by most recent transthoracic echocardiogram.  I reviewed cardiology office notes from [facility] on 11/30/2016.  She was previously evaluated by hematology at [facility] who felt that lifelong anticoagulation and not PFO closure was the best management strategy.  She was hospitalized for dysphasia which appeared to be functional in April 2019.  I reviewed the echocardiogram performed on 05/02/2017 which demonstrated normal left ventricular systolic function, EF 60 to 65%.  I reviewed carotid Dopplers performed on 05/01/2017 which showed no hemodynamically significant stenosis.  She denies chest pain, palpitations, and shortness of breath.  She has been having issues with hyperglycemia lately.  Social history: She is married to [place].  Allergies  Allergen Reactions  . Lyrica [Pregabalin] Other (See Comments)    SOB, lower extremity edema and weight gain  . Flexeril [Cyclobenzaprine Hcl] Other (See Comments)    Numbness of extremities  . Morphine And Related Itching    Upper torso  . Sulfamethoxazole-Trimethoprim Itching and Rash    Bactrim  . Belsomra [Suvorexant] Other (See Comments)    unknown  . Tape Itching and Rash    Please use "paper" tape Rash if left on longer than 24 hrs     Current Outpatient Medications  Medication Sig Dispense Refill  . bacitracin-polymyxin b (POLYSPORIN) ointment Apply 1 application topically 3 (three) times daily as needed (burns).    . Continuous Blood Gluc Receiver (FREESTYLE LIBRE 14 DAY READER) DEVI     . Continuous Blood Gluc Sensor (FREESTYLE LIBRE 14 DAY SENSOR) MISC     . Cyanocobalamin (B-12) 5000 MCG SUBL Place 5,000 mcg under the tongue daily at 12 noon.    . diclofenac sodium (VOLTAREN) 1 % GEL Apply 4 g topically 4 (four) times daily as needed (PAIN).     Marland Kitchen donepezil (ARICEPT) 10 MG tablet Take 1 tablet (10 mg total) by mouth at bedtime. 30 tablet 3  . DULoxetine (CYMBALTA) 60 MG capsule Take 60 mg by mouth 2 (two) times daily.  11  . folic acid (FOLVITE) 725 MCG tablet Take 400 mcg by mouth daily.    Marland Kitchen FREESTYLE LITE test strip   5  . gabapentin (NEURONTIN) 600 MG tablet Take 600 mg by mouth 3 (three) times daily.    Marland Kitchen L-Methylfolate-B12-B6-B2 (CEREFOLIN) 06-22-48-5 MG TABS Take 1 tablet by mouth daily. 90 each 3  . Lancets (FREESTYLE) lancets   5  . levothyroxine (SYNTHROID, LEVOTHROID) 125 MCG tablet Take 125 mcg by mouth daily before breakfast.    . methocarbamol (ROBAXIN) 750 MG tablet Take 1 tablet by mouth 3 times daily. (Patient not taking: Reported on 05/27/2017) 90 tablet 0  . methylphenidate (RITALIN) 20 MG tablet Take 10 mg by mouth 5 (five) times daily. Between 0800 and 1600.  Takes 1/2 tablet up to 5 times daily    . mirabegron ER (MYRBETRIQ) 50 MG TB24 tablet Take 50 mg by mouth daily.    . Multiple Minerals-Vitamins (CAL-MAG-ZINC-D PO) Take 1-2 tablets by mouth See admin instructions. Take 2 tablet at 1200 and 1 tablet at 1600  Calcium 1000 mg Vit D 600 iu Magnesium 400 mg Zinc 15 mg    . NARCAN 4 MG/0.1ML LIQD nasal spray kit Place 1 spray into the nose as needed (over dose).     . Neomy-Bacit-Polymyx-Pramoxine (NEOSPORIN + PAIN RELIEF MAX ST) 1 % OINT Apply 1 application topically as needed (wound care).    Marland Kitchen  omeprazole (PRILOSEC) 40 MG capsule Take 1 capsule by mouth twice daily. 60 capsule 4  . ondansetron (ZOFRAN-ODT) 4 MG disintegrating tablet Take 4 mg by mouth every 6 (six) hours as needed for nausea.   2  . Oxycodone HCl 10 MG TABS Take 1 tablet (10 mg total) by mouth every 4 (four) hours as needed. 30 tablet 0  . Phosphatidylserine-DHA-EPA (VAYACOG) 100-19.5-6.5 MG CAPS Take by mouth.    . polyethylene glycol powder (MIRALAX) powder Take 17 g by mouth 2 (two) times daily as needed for moderate constipation.     . Polyvinyl Alcohol-Povidone (REFRESH OP) Place 1-2 drops into both eyes 3 (three) times daily as needed (dry eyes).    . rivaroxaban (XARELTO) 20 MG TABS tablet Take 1 tablet (20 mg total) by mouth daily with supper. (Patient taking differently: Take 20 mg by mouth at bedtime. ) 30 tablet 1  . topiramate (TOPAMAX) 50 MG tablet One tablet in the morning and 2 in the evening 90 tablet 3  . traMADol (ULTRAM) 50 MG tablet   5  . traZODone (DESYREL) 100 MG tablet Take 50-200 mg by mouth at bedtime as needed for sleep (depends on insomnia).     Marland Kitchen UNABLE TO FIND Apply 1 application topically 2 (two) times daily as needed (pain). Med Name: C-Anti-Inflammatory Cream  Diclofenac 3% / Baclofen 2% / Cyclobenzaprine 2% / Lidocaine 2%    . Vitamin D, Ergocalciferol, (DRISDOL) 50000 UNITS CAPS Take 50,000 Units by mouth every 7 (seven) days. Mondays     No current facility-administered medications for this visit.     Past Medical History:  Diagnosis Date  . ADD (attention deficit disorder)    takes Adderall daily  . Arthritis    "all over"  . Cerebral infarction ([place]) 10/30/2011  . Cerebrovascular disease 08/14/2016  . Chronic back pain    "all over"  . Chronic low back pain 01/11/2016  . Complication of anesthesia    tends to have hypotension when NPO and post-anesthesia  . Constipation    takes stool softener daily  . Degenerative disk disease   . Degenerative joint disease   .  Depression    takes Cymbalta daily for pain per pt  . Diabetes mellitus without complication ([place])   . DVT (deep venous thrombosis) ([facility])    RLE  . Family history of adverse reaction to anesthesia    a family member woke up during surgery; "think it was my mom"  . Fibromyalgia   . Generalized osteoarthritis of multiple sites 11/04/2013  . GERD (gastroesophageal reflux disease)   . Heart palpitations 12/14/2013  . History of blood clots    superficial  . Hypoglycemia   . Hypothyroid    takes Synthroid daily  . Incomplete emptying of bladder   . Insomnia    takes  Trazodone nightly  . Iron deficiency anemia    takes Ferrous Sulfate daily  . Joint pain   . Joint swelling    knees and ankles  . Memory disorder 08/14/2016  . Morbid obesity ([place])   . Nausea    takes Zofran as needed.Seeing GI doc  . Neck pain 05/25/2016  . OSA on CPAP    tested more than 5 yrs ago.    . Osteoarthritis   . PFO (patent foramen ovale)   . Primary osteoarthritis of both feet 05/29/2016   Right bunionectomy August 2017 by Dr. Sharol Given  . Scoliosis   . Sleep apnea   . Stroke [facility] "several"   right foot weakness; memory issues, black spot right visual field since" (03/23/2015)  . Thrombophlebitis   . Trochanteric bursitis of both hips 05/25/2016  . Unilateral primary osteoarthritis, right knee 05/29/2016  . Urinary urgency 04/19/2016  . Vein disorder 11/29/2010    Past Surgical History:  Procedure Laterality Date  . BUNIONECTOMY Right 08/2015  . CARDIAC CATHETERIZATION     2008.  "it was fine" (not sure why she had it done, and doesn't know where)  . COLONOSCOPY N/A 03/25/2013   Procedure: COLONOSCOPY;  Surgeon: Rogene Houston, MD;  [facility];  Service: Endoscopy;  Laterality: N/A;  930  . ESOPHAGOGASTRODUODENOSCOPY    . EXPLORATORY LAPAROTOMY     "took fallopian tubes out"  . JOINT REPLACEMENT     bil knee   . KNEE ARTHROPLASTY    . KNEE ARTHROSCOPY Left   . KNEE ARTHROSCOPY W/ ACL  RECONSTRUCTION Right    "added pins"  . LAPAROSCOPIC CHOLECYSTECTOMY  ~ 2001  . 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 SPINAL CORD STIMULATOR INSERTION N/A 04/18/2017   Procedure: LUMBAR SPINAL CORD STIMULATOR INSERTION;  Surgeon: Clydell Hakim, MD;  Location: [place];  Service: Neurosurgery;  Laterality: N/A;  LUMBAR SPINAL CORD STIMULATOR INSERTION  . TOTAL KNEE ARTHROPLASTY Left 03/23/2015   Procedure: TOTAL KNEE ARTHROPLASTY;  Surgeon: Newt Minion, MD;  Location: [place];  Service: Orthopedics;  Laterality: Left;  . TOTAL KNEE ARTHROPLASTY Right 08/15/2016   Procedure: RIGHT TOTAL KNEE ARTHROPLASTY, REMOVAL ACL SCREWS;  Surgeon: Newt Minion, MD;  Location: [place];  Service: Orthopedics;  Laterality: Right;  . TOTAL KNEE ARTHROPLASTY WITH HARDWARE REMOVAL Right   . VAGINAL HYSTERECTOMY    . VARICOSE VEIN SURGERY Right X 2    Social History   Socioeconomic History  . Marital status: Married    Spouse name: Jeneen Rinks  . Number of children: 1  . Years of education: 71  . Highest education level: Not on file  Occupational History  . Occupation: Disability  . Occupation: formerly Therapist, sports, [employer]  Social Needs  . Financial resource strain: Not on file  . Food insecurity:    Worry: Not on file    Inability: Not on file  . Transportation needs:    Medical: Not on file    Non-medical: Not on file  Tobacco Use  . Smoking status: Former Smoker    Packs/day: 0.75    Years: 8.00    Pack years: 6.00    Types: Cigarettes    Last attempt to quit: 12/01/1990    Years since quitting: 26.5  . Smokeless tobacco: Never Used  . Tobacco comment: quit smoking in the 1990s  Substance and Sexual Activity  . Alcohol use: No    Comment: 03/23/2015 "stopped drinking in 2012 w/gastric bypass; drank socially  before bypass"  . Drug use: No  . Sexual activity: Not Currently    Birth control/protection: Surgical  Lifestyle  . Physical activity:    Days per week: Not on file    Minutes per session: Not on file    . Stress: Not on file  Relationships  . Social connections:    Talks on phone: Not on file    Gets together: Not on file    Attends religious service: Not on file    Active member of club or organization: Not on file    Attends meetings of clubs or organizations: Not on file    Relationship status: Not on file  . Intimate partner violence:    Fear of current or ex partner: Not on file    Emotionally abused: Not on file    Physically abused: Not on file    Forced sexual activity: Not on file  Other Topics Concern  . Not on file  Social History Narrative   Lives with husband   Caffeine use: No soda   Mainly water, drinks decaf tea   Right handed     No family history of premature CAD in 1st degree relatives.  No outpatient medications have been marked as taking for the 06/20/17 encounter (Office Visit) with Herminio Commons, MD.      Review of systems complete and found to be negative unless listed above in HPI    Physical exam Blood pressure 116/70, pulse 71, height _0  (1.651 m), weight 210 lb (95.3 kg), SpO2 98 %. General: NAD Neck: No JVD, no thyromegaly or thyroid nodule.  Lungs: Clear to auscultation bilaterally with normal respiratory effort. CV: Nondisplaced PMI. Regular rate and rhythm, normal S1/S2, no S3/S4, no murmur.  Bilateral venous varicosities, right greater than left. Abdomen: Soft, nontender, obese.  Skin: Intact without lesions or rashes.  Neurologic: Alert and oriented x 3.  Psych: Normal affect. Extremities: No clubbing or cyanosis.  HEENT: Normal.   ECG: Most recent ECG reviewed.   Labs: Lab Results  Component Value Date/Time   K 4.1 05/02/2017 04:19 AM   BUN 16 05/02/2017 04:19 AM   CREATININE 0.79 05/02/2017 04:19 AM   CREATININE 1.03 03/12/2017 12:13 PM   ALT 120 (H) 05/03/2017 04:36 AM   TSH 4.289 05/01/2017 03:40 PM   TSH 3.328 10/30/2011 10:56 PM   HGB 12.9 05/01/2017 12:48 PM     Lipids: Lab Results  Component Value  Date/Time   LDLCALC 66 05/02/2017 04:19 AM   CHOL 151 05/02/2017 04:19 AM   TRIG 54 05/02/2017 04:19 AM   HDL 74 05/02/2017 04:19 AM        ASSESSMENT AND PLAN:  1.  PFO with recurrent CVA and history of DVT: She was previously evaluated by hematology at [facility] who felt that lifelong anticoagulation and not PFO closure was the best management strategy.  She has been maintained on Xarelto 20 mg daily.  2.  Bilateral venous varicosities, right greater than left: She has a dragging sensation in her right leg by the end of the day.  She has tried both thigh-high compression stockings and waist high but both seem to lose support.  She is wondering if she is a candidate for any potential interventions.  I will make a referral to vascular surgery to obtain their opinion (Dr. Servando Snare).    Disposition: Follow up in 1 year   Signed: Kate Sable, M.D., F.A.C.C.  06/20/2017, 1:50 PM

## 2017-06-20 NOTE — Patient Instructions (Addendum)
Your physician wants you to follow-up in: 1 year  with Dr Virgina Jock will receive a reminder letter in the mail two months in advance. If you don't receive a letter, please call our office to schedule the follow-up appointment.     Your physician recommends that you continue on your current medications as directed. Please refer to the Current Medication list given to you today.    If you need a refill on your cardiac medications before your next appointment, please call your pharmacy.   You have been referred to Dr Servando Snare at [facility]  [phone], they will call you for an apt      No lab work ordered today    Thank you for choosing [facility] !

## 2017-06-24 DIAGNOSIS — G4733 Obstructive sleep apnea (adult) (pediatric): Secondary | ICD-10-CM | POA: Diagnosis not present

## 2017-06-25 ENCOUNTER — Other Ambulatory Visit: Payer: Self-pay

## 2017-06-25 DIAGNOSIS — I83893 Varicose veins of bilateral lower extremities with other complications: Secondary | ICD-10-CM

## 2017-06-27 ENCOUNTER — Ambulatory Visit: Payer: PPO | Admitting: Registered"

## 2017-06-27 DIAGNOSIS — R740 Nonspecific elevation of levels of transaminase and lactic acid dehydrogenase [LDH]: Secondary | ICD-10-CM | POA: Diagnosis not present

## 2017-06-27 LAB — HEPATIC FUNCTION PANEL
AG Ratio: 1.9 (calc) (ref 1.0–2.5)
ALT: 80 U/L — ABNORMAL HIGH (ref 6–29)
AST: 29 U/L (ref 10–35)
Albumin: 4.1 g/dL (ref 3.6–5.1)
Alkaline phosphatase (APISO): 96 U/L (ref 33–130)
Bilirubin, Direct: 0.1 mg/dL (ref 0.0–0.2)
Globulin: 2.2 g/dL (calc) (ref 1.9–3.7)
Indirect Bilirubin: 0.3 mg/dL (calc) (ref 0.2–1.2)
Total Bilirubin: 0.4 mg/dL (ref 0.2–1.2)
Total Protein: 6.3 g/dL (ref 6.1–8.1)

## 2017-07-01 ENCOUNTER — Other Ambulatory Visit (INDEPENDENT_AMBULATORY_CARE_PROVIDER_SITE_OTHER): Payer: Self-pay | Admitting: *Deleted

## 2017-07-01 DIAGNOSIS — R1011 Right upper quadrant pain: Secondary | ICD-10-CM

## 2017-07-04 DIAGNOSIS — N390 Urinary tract infection, site not specified: Secondary | ICD-10-CM | POA: Diagnosis not present

## 2017-07-05 ENCOUNTER — Ambulatory Visit (INDEPENDENT_AMBULATORY_CARE_PROVIDER_SITE_OTHER): Payer: Self-pay | Admitting: Physical Medicine and Rehabilitation

## 2017-07-09 ENCOUNTER — Telehealth (INDEPENDENT_AMBULATORY_CARE_PROVIDER_SITE_OTHER): Payer: Self-pay | Admitting: Specialist

## 2017-07-09 NOTE — Telephone Encounter (Signed)
Patient called asking if the order for her right hip injection to Dr. Ernestina Patches could be changed to the left since the left is hurting her more right now. CB # [id number] *Her appointment is this week*

## 2017-07-10 NOTE — Telephone Encounter (Signed)
Patient called asking if the order for her right hip injection to Dr. Ernestina Patches could be changed to the left since the left is hurting her more right now.

## 2017-07-11 ENCOUNTER — Telehealth: Payer: Self-pay | Admitting: Adult Health

## 2017-07-11 ENCOUNTER — Ambulatory Visit (INDEPENDENT_AMBULATORY_CARE_PROVIDER_SITE_OTHER): Payer: PPO | Admitting: Adult Health

## 2017-07-11 ENCOUNTER — Encounter: Payer: Self-pay | Admitting: Adult Health

## 2017-07-11 VITALS — BP 103/68 | HR 60 | Ht 65.0 in | Wt 208.4 lb

## 2017-07-11 DIAGNOSIS — E7849 Other hyperlipidemia: Secondary | ICD-10-CM | POA: Diagnosis not present

## 2017-07-11 DIAGNOSIS — M797 Fibromyalgia: Secondary | ICD-10-CM

## 2017-07-11 DIAGNOSIS — I679 Cerebrovascular disease, unspecified: Secondary | ICD-10-CM | POA: Diagnosis not present

## 2017-07-11 DIAGNOSIS — R569 Unspecified convulsions: Secondary | ICD-10-CM

## 2017-07-11 DIAGNOSIS — R7309 Other abnormal glucose: Secondary | ICD-10-CM | POA: Diagnosis not present

## 2017-07-11 DIAGNOSIS — G43019 Migraine without aura, intractable, without status migrainosus: Secondary | ICD-10-CM | POA: Diagnosis not present

## 2017-07-11 NOTE — Progress Notes (Signed)
I have read the note, and I agree with the clinical assessment and plan.  Michal Callicott K Nylan Nakatani   
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
## 2017-07-11 NOTE — Telephone Encounter (Signed)
Pt called she is wanting to use the ajovy. She said Jinny Blossom wanted her to call to let her know.

## 2017-07-11 NOTE — Patient Instructions (Addendum)
Your Plan:  Continue topamax Consider Aimovig or Ajovy If your symptoms worsen or you develop new symptoms please let us know.   Thank you for coming to see [place] at [facility]. I hope we have been able to provide you high quality care today.  You may receive a patient satisfaction survey over the next few weeks. We would appreciate your feedback and comments so that we may continue to improve ourselves and the health of our patients.

## 2017-07-12 DIAGNOSIS — F1120 Opioid dependence, uncomplicated: Secondary | ICD-10-CM | POA: Diagnosis not present

## 2017-07-12 DIAGNOSIS — M5136 Other intervertebral disc degeneration, lumbar region: Secondary | ICD-10-CM | POA: Diagnosis not present

## 2017-07-12 DIAGNOSIS — Z6834 Body mass index (BMI) 34.0-34.9, adult: Secondary | ICD-10-CM | POA: Diagnosis not present

## 2017-07-12 DIAGNOSIS — E6609 Other obesity due to excess calories: Secondary | ICD-10-CM | POA: Diagnosis not present

## 2017-07-12 MED ORDER — FREMANEZUMAB-VFRM 225 MG/1.5ML ~~LOC~~ SOSY: 225.0000 mg | PREFILLED_SYRINGE | SUBCUTANEOUS | 5 refills | Status: DC

## 2017-07-12 NOTE — Telephone Encounter (Signed)
Spoke with patient and informed her the Ajovy Rx was sent to [facility]. Advised her it will require a PA, and she stated she called her insurance yesterday. She stated they were "beginning the process" to get it approved. This RN advised she will be updated on PA. She verbalized understanding, appreciation.

## 2017-07-12 NOTE — Telephone Encounter (Signed)
Received fax from [place] for coverage determination for Ajovy. Completed, signed and faxed back to [facility].

## 2017-07-12 NOTE — Telephone Encounter (Signed)
Ajovy ordered.

## 2017-07-15 DIAGNOSIS — M6289 Other specified disorders of muscle: Secondary | ICD-10-CM | POA: Diagnosis not present

## 2017-07-15 DIAGNOSIS — M62838 Other muscle spasm: Secondary | ICD-10-CM | POA: Diagnosis not present

## 2017-07-15 DIAGNOSIS — R102 Pelvic and perineal pain: Secondary | ICD-10-CM | POA: Diagnosis not present

## 2017-07-15 NOTE — Telephone Encounter (Signed)
Received faxed APPROVAL from [employer]. Effective 07/12/17-01/21/18.

## 2017-07-16 ENCOUNTER — Ambulatory Visit: Payer: PPO | Admitting: Registered"

## 2017-07-16 DIAGNOSIS — E6609 Other obesity due to excess calories: Secondary | ICD-10-CM | POA: Diagnosis not present

## 2017-07-16 DIAGNOSIS — Z6834 Body mass index (BMI) 34.0-34.9, adult: Secondary | ICD-10-CM | POA: Diagnosis not present

## 2017-07-16 DIAGNOSIS — N342 Other urethritis: Secondary | ICD-10-CM | POA: Diagnosis not present

## 2017-07-16 DIAGNOSIS — N390 Urinary tract infection, site not specified: Secondary | ICD-10-CM | POA: Diagnosis not present

## 2017-07-17 DIAGNOSIS — R109 Unspecified abdominal pain: Secondary | ICD-10-CM | POA: Diagnosis not present

## 2017-07-17 DIAGNOSIS — Z9884 Bariatric surgery status: Secondary | ICD-10-CM | POA: Diagnosis not present

## 2017-07-23 NOTE — Telephone Encounter (Signed)
See message below  Left hip injection    Pt called to check on the status of order

## 2017-07-23 NOTE — Telephone Encounter (Signed)
I spoke with the patient and told her to keep her appointment on 7/12 and that she could discuss this with Dr. Ernestina Patches then.

## 2017-07-26 ENCOUNTER — Encounter (HOSPITAL_COMMUNITY): Payer: Self-pay | Admitting: Emergency Medicine

## 2017-07-26 ENCOUNTER — Other Ambulatory Visit: Payer: Self-pay

## 2017-07-26 ENCOUNTER — Emergency Department (HOSPITAL_COMMUNITY): Payer: PPO

## 2017-07-26 ENCOUNTER — Emergency Department (HOSPITAL_COMMUNITY)
Admission: EM | Admit: 2017-07-26 | Discharge: 2017-07-26 | Disposition: A | Discharge status: Home / Self Care | Payer: PPO | Attending: Emergency Medicine | Admitting: Emergency Medicine

## 2017-07-26 DIAGNOSIS — R109 Unspecified abdominal pain: Secondary | ICD-10-CM

## 2017-07-26 DIAGNOSIS — R197 Diarrhea, unspecified: Secondary | ICD-10-CM | POA: Diagnosis not present

## 2017-07-26 DIAGNOSIS — E119 Type 2 diabetes mellitus without complications: Secondary | ICD-10-CM | POA: Insufficient documentation

## 2017-07-26 DIAGNOSIS — Z7901 Long term (current) use of anticoagulants: Secondary | ICD-10-CM | POA: Diagnosis not present

## 2017-07-26 DIAGNOSIS — R1032 Left lower quadrant pain: Secondary | ICD-10-CM | POA: Diagnosis not present

## 2017-07-26 DIAGNOSIS — R51 Headache: Secondary | ICD-10-CM | POA: Diagnosis not present

## 2017-07-26 DIAGNOSIS — E039 Hypothyroidism, unspecified: Secondary | ICD-10-CM | POA: Diagnosis not present

## 2017-07-26 DIAGNOSIS — Z87891 Personal history of nicotine dependence: Secondary | ICD-10-CM | POA: Diagnosis not present

## 2017-07-26 LAB — CBC WITH DIFFERENTIAL/PLATELET
Basophils Absolute: 0 10*3/uL (ref 0.0–0.1)
Basophils Relative: 1 %
Eosinophils Absolute: 0.2 10*3/uL (ref 0.0–0.7)
Eosinophils Relative: 4 %
HCT: 36.3 % (ref 36.0–46.0)
Hemoglobin: 11.9 g/dL — ABNORMAL LOW (ref 12.0–15.0)
Lymphocytes Relative: 41 %
Lymphs Abs: 2.1 10*3/uL (ref 0.7–4.0)
MCH: 32.7 pg (ref 26.0–34.0)
MCHC: 32.8 g/dL (ref 30.0–36.0)
MCV: 99.7 fL (ref 78.0–100.0)
Monocytes Absolute: 0.4 10*3/uL (ref 0.1–1.0)
Monocytes Relative: 8 %
Neutro Abs: 2.3 10*3/uL (ref 1.7–7.7)
Neutrophils Relative %: 46 %
Platelets: 186 10*3/uL (ref 150–400)
RBC: 3.64 MIL/uL — ABNORMAL LOW (ref 3.87–5.11)
RDW: 13.6 % (ref 11.5–15.5)
WBC: 5 10*3/uL (ref 4.0–10.5)

## 2017-07-26 LAB — COMPREHENSIVE METABOLIC PANEL
ALT: 153 U/L — ABNORMAL HIGH (ref 0–44)
AST: 71 U/L — ABNORMAL HIGH (ref 15–41)
Albumin: 3.5 g/dL (ref 3.5–5.0)
Alkaline Phosphatase: 127 U/L — ABNORMAL HIGH (ref 38–126)
Anion gap: 6 (ref 5–15)
BUN: 17 mg/dL (ref 6–20)
CO2: 25 mmol/L (ref 22–32)
Calcium: 8.8 mg/dL — ABNORMAL LOW (ref 8.9–10.3)
Chloride: 110 mmol/L (ref 98–111)
Creatinine, Ser: 0.87 mg/dL (ref 0.44–1.00)
GFR calc Af Amer: >60 mL/min (ref >60)
GFR calc non Af Amer: >60 mL/min (ref >60)
Glucose, Bld: 107 mg/dL — ABNORMAL HIGH (ref 70–99)
Potassium: 4 mmol/L (ref 3.5–5.1)
Sodium: 141 mmol/L (ref 135–145)
Total Bilirubin: 0.6 mg/dL (ref 0.3–1.2)
Total Protein: 5.7 g/dL — ABNORMAL LOW (ref 6.5–8.1)

## 2017-07-26 LAB — LIPASE, BLOOD: Lipase: 41 U/L (ref 11–51)

## 2017-07-26 LAB — URINALYSIS, ROUTINE W REFLEX MICROSCOPIC
Bacteria, UA: NONE SEEN
Bilirubin Urine: NEGATIVE
Glucose, UA: NEGATIVE mg/dL
Hgb urine dipstick: NEGATIVE
Ketones, ur: NEGATIVE mg/dL
Leukocytes, UA: NEGATIVE
Nitrite: POSITIVE — AB
Protein, ur: NEGATIVE mg/dL
Specific Gravity, Urine: 1.024 (ref 1.005–1.030)
pH: 5 (ref 5.0–8.0)

## 2017-07-26 MED ORDER — IOPAMIDOL (ISOVUE-300) INJECTION 61%
100.0000 mL | Freq: Once | INTRAVENOUS | Status: AC | PRN
  Administered 2017-07-26: 100 mL via INTRAVENOUS

## 2017-07-26 MED ORDER — SODIUM CHLORIDE 0.9 % IV SOLN
INTRAVENOUS | Status: DC
  Administered 2017-07-26: 09:00:00 via INTRAVENOUS

## 2017-07-26 MED ORDER — SODIUM CHLORIDE 0.9 % IV BOLUS
500.0000 mL | Freq: Once | INTRAVENOUS | Status: AC
  Administered 2017-07-26: 500 mL via INTRAVENOUS

## 2017-07-26 MED ORDER — NITROFURANTOIN MONOHYD MACRO 100 MG PO CAPS: 100.0000 mg | ORAL_CAPSULE | Freq: Two times a day (BID) | ORAL | 0 refills | Status: DC

## 2017-07-26 NOTE — ED Provider Notes (Signed)
[facility] EMERGENCY DEPARTMENT Provider Note   CSN: [id number] Arrival date & time: 07/26/17  0258     History   Chief Complaint Chief Complaint  Patient presents with  . Flank Pain    HPI Debbie Pineda is a 57 y.o. female.  HPI   Planes of abdominal pain left-sided radiating to left flank, present for 3 months.  She has been evaluated and treated by her PCP for UTI during this time with Cipro.  She had a urine culture done, results returned yesterday and showed E. coli sensitive to [place].  She is taken 1 dose of Macrobid so far.  She denies fever, chills, nausea, vomiting, weakness or dizziness.  She has chronic intermittent headaches and chronic pain for which she uses oxycodone, prescribed by her PCP.  She has chronic intermittent transaminitis followed by GI.  She had a colonoscopy 8 years ago which she recalls as being normal.  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  There are no other known modifying factors.   Past Medical History:  Diagnosis Date  . ADD (attention deficit disorder)    takes Adderall daily  . Arthritis    "all over"  . Cerebral infarction ([place]) 10/30/2011  . Cerebrovascular disease 08/14/2016  . Chronic back pain    "all over"  . Chronic low back pain 01/11/2016  . Complication of anesthesia    tends to have hypotension when NPO and post-anesthesia  . Constipation    takes stool softener daily  . Degenerative disk disease   . Degenerative joint disease   . Depression    takes Cymbalta daily for pain per pt  . Diabetes mellitus without complication ([place])   . DVT (deep venous thrombosis) (H[facility])    RLE  . Family history of adverse reaction to anesthesia    a family member woke up during surgery; "think it was my mom"  . Fibromyalgia   . Generalized osteoarthritis of multiple sites 11/04/2013  . GERD (gastroesophageal reflux disease)   . Heart palpitations 12/14/2013  . History of blood clots    superficial  . Hypoglycemia   . Hypothyroid    takes Synthroid daily  . Incomplete emptying of bladder   . Insomnia    takes Trazodone nightly  . Iron deficiency anemia    takes Ferrous Sulfate daily  . Joint pain   . Joint swelling    knees and ankles  . Memory disorder 08/14/2016  . Morbid obesity ([place])   . Nausea    takes Zofran as needed.Seeing GI doc  . Neck pain 05/25/2016  . OSA on CPAP    tested more than 5 yrs ago.    . Osteoarthritis   . PFO (patent foramen ovale)   . Primary osteoarthritis of both feet 05/29/2016   Right bunionectomy August 2017 by Dr. Sharol Given  . Scoliosis   . Sleep apnea   . Stroke [facility]) "several"   right foot weakness; memory issues, black spot right visual field since" (03/23/2015)  . Thrombophlebitis   . Trochanteric bursitis of both hips 05/25/2016  . Unilateral primary osteoarthritis, right knee 05/29/2016  . Urinary urgency 04/19/2016  . Vein disorder 11/29/2010    Patient Active Problem List   Diagnosis Date Noted  . History of diabetes mellitus 06/06/2017  . Primary osteoarthritis of right hand 06/06/2017  . Transaminasemia   . TIA (transient ischemic attack) 05/01/2017  . Dysphasia 05/01/2017  . Chronic pain syndrome 05/01/2017  . Confusion   .  Presence of right artificial knee joint 09/17/2016  . H/O total knee replacement, right 08/15/2016  . Presence of retained hardware   . Memory disorder 08/14/2016  . Cerebrovascular disease 08/14/2016  . Unilateral primary osteoarthritis, right knee 05/29/2016  . Primary osteoarthritis of both feet 05/29/2016  . Trochanteric bursitis of both hips 05/25/2016  . Neck pain 05/25/2016  . Urinary urgency 04/19/2016  . Chronic low back pain 01/11/2016  . Depression 11/29/2015  . 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 Heart palpitations 12/14/2013  . Generalized osteoarthritis of multiple sites 11/04/2013  . Cerebral infarction ([place]) 10/30/2011  . PFO (patent foramen ovale) 10/30/2011  .  Bradycardia 10/30/2011  . Chest pain 10/30/2011  . Hypothyroid   . Thrombophlebitis   . Sleep apnea   . Fibromyalgia   . 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 Status post bariatric surgery 12/07/2010  . Vein disorder 11/29/2010  . S/P total hysterectomy and bilateral salpingo-oophorectomy 11/29/2010  . S/P exploratory laparotomy 11/29/2010  . S/P cholecystectomy 11/29/2010  . S/P ACL surgery 11/29/2010  . Morbid obesity ([place]) 11/10/2010    Past Surgical History:  Procedure Laterality Date  . BUNIONECTOMY Right 08/2015  . CARDIAC CATHETERIZATION     2008.  "it was fine" (not sure why she had it done, and doesn't know where)  . COLONOSCOPY N/A 03/25/2013   Procedure: COLONOSCOPY;  Surgeon: Rogene Houston, MD;  [facility];  Service: Endoscopy;  Laterality: N/A;  930  . ESOPHAGOGASTRODUODENOSCOPY    . EXPLORATORY LAPAROTOMY     "took fallopian tubes out"  . JOINT REPLACEMENT     bil knee   . KNEE ARTHROPLASTY    . KNEE ARTHROSCOPY Left   . KNEE ARTHROSCOPY W/ ACL RECONSTRUCTION Right    "added pins"  . LAPAROSCOPIC CHOLECYSTECTOMY  ~ 2001  . 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 SPINAL CORD STIMULATOR INSERTION N/A 04/18/2017   Procedure: LUMBAR SPINAL CORD STIMULATOR INSERTION;  Surgeon: Clydell Hakim, MD;  Location: [place];  Service: Neurosurgery;  Laterality: N/A;  LUMBAR SPINAL CORD STIMULATOR INSERTION  . TOTAL KNEE ARTHROPLASTY Left 03/23/2015   Procedure: TOTAL KNEE ARTHROPLASTY;  Surgeon: Newt Minion, MD;  Location: [place];  Service: Orthopedics;  Laterality: Left;  . TOTAL KNEE ARTHROPLASTY Right 08/15/2016   Procedure: RIGHT TOTAL KNEE ARTHROPLASTY, REMOVAL ACL SCREWS;  Surgeon: Newt Minion, MD;  Location: [place];  Service: Orthopedics;  Laterality: Right;  . TOTAL KNEE ARTHROPLASTY WITH HARDWARE REMOVAL Right   . VAGINAL HYSTERECTOMY    . VARICOSE VEIN SURGERY Right X 2     OB History    Gravida  2   Para      Term      Preterm        AB  1   Living  1     SAB  1   TAB      Ectopic      Multiple      Live Births               Home Medications    Prior to Admission medications   Medication Sig Start Date End Date Taking? Authorizing Provider  baclofen (LIORESAL) 10 MG tablet Take 10 mg by mouth 3 (three) times daily as needed. 06/30/17  Yes [provider]  Cyanocobalamin (B-12) 5000 MCG SUBL Place 5,000 mcg under the tongue daily at 12 noon.   Yes [provider]  diclofenac sodium (VOLTAREN)  1 % GEL Apply 4 g topically 4 (four) times daily as needed (PAIN).    Yes [provider]  donepezil (ARICEPT) 10 MG tablet Take 1 tablet (10 mg total) by mouth at bedtime. 04/30/17  Yes [name], MD  DULoxetine (CYMBALTA) 60 MG capsule Take 60 mg by mouth 2 (two) times daily. 01/17/15  Yes [provider]  folic acid (FOLVITE) 580 MCG tablet Take 400 mcg by mouth daily.   Yes [provider]  Fremanezumab-vfrm (AJOVY) 225 MG/1.5ML SOSY Inject 225 mg into the skin every 30 (thirty) days. 07/12/17  Yes Ward Givens, NP  gabapentin (NEURONTIN) 600 MG tablet Take 600 mg by mouth 3 (three) times daily.   Yes [provider]  KRILL OIL PO Take by mouth.   Yes [provider]  L-Methylfolate-B12-B6-B2 (CEREFOLIN PO) Take by mouth.   Yes [provider]  levothyroxine (SYNTHROID, LEVOTHROID) 125 MCG tablet Take 125 mcg by mouth daily before breakfast.   Yes [provider]  methylphenidate (RITALIN) 20 MG tablet Take 10 mg by mouth 5 (five) times daily. Between 0800 and 1600. Takes 1/2 tablet up to 5 times daily   Yes [provider]  mirabegron ER (MYRBETRIQ) 50 MG TB24 tablet Take 50 mg by mouth daily.   Yes [provider]  Multiple Minerals-Vitamins (CAL-MAG-ZINC-D PO) Take 1-2 tablets by mouth See admin instructions. Take 2 tablet at 1200 and 1 tablet at 1600  Calcium 1000 mg Vit D 600 iu Magnesium 400 mg Zinc  15 mg   Yes [provider]  NARCAN 4 MG/0.1ML LIQD nasal spray kit Place 1 spray into the nose as needed (over dose).  11/14/16  Yes [provider]  Neomy-Bacit-Polymyx-Pramoxine (NEOSPORIN + PAIN RELIEF MAX ST) 1 % OINT Apply 1 application topically as needed (wound care).   Yes [provider]  omeprazole (PRILOSEC) 40 MG capsule Take 1 capsule by mouth twice daily. 04/02/17  Yes Setzer, Terri L, NP  ondansetron (ZOFRAN-ODT) 4 MG disintegrating tablet Take 4 mg by mouth every 6 (six) hours as needed for nausea.  03/02/15  Yes [provider]  Oxycodone HCl 10 MG TABS Take 1 tablet (10 mg total) by mouth every 4 (four) hours as needed. 04/18/17  Yes Clydell Hakim, MD  Polyvinyl Alcohol-Povidone (REFRESH OP) Place 1-2 drops into both eyes 3 (three) times daily as needed (dry eyes).   Yes [provider]  rivaroxaban (XARELTO) 20 MG TABS tablet Take 1 tablet (20 mg total) by mouth daily with supper. Patient taking differently: Take 20 mg by mouth at bedtime.  03/25/15  Yes Newt Minion, MD  topiramate (TOPAMAX) 50 MG tablet One tablet in the morning and 2 in the evening Patient taking differently: Take 100 mg by mouth 2 (two) times daily. One tablet in the morning and 2 in the evening 05/08/17  Yes [name], MD  traMADol (ULTRAM) 50 MG tablet Take 50 mg by mouth 3 (three) times daily as needed.  05/30/17  Yes [provider]  traZODone (DESYREL) 100 MG tablet Take 100-200 mg by mouth at bedtime as needed for sleep (depends on insomnia).    Yes [provider]  UNABLE TO FIND Apply 1 application topically 2 (two) times daily as needed (pain). Med Name: C-Anti-Inflammatory Cream  Diclofenac 3% / Baclofen 2% / Cyclobenzaprine 2% / Lidocaine 2%   Yes [provider]  Vitamin D, Ergocalciferol, (DRISDOL) 50000 UNITS CAPS Take 50,000 Units by  mouth every 7 (seven) days. Mondays   Yes [provider]  Continuous Blood Gluc  Sensor (FREESTYLE LIBRE 14 DAY SENSOR) MISC  06/05/17   [provider]  nitrofurantoin, macrocrystal-monohydrate, (MACROBID) 100 MG capsule Take 1 capsule (100 mg total) by mouth 2 (two) times daily. X 7 days 07/26/17   Daleen Bo, MD    Family History Family History  Problem Relation Age of Onset  . Heart disease Father   . Cancer Father   . Cancer Mother        skin cancer   . Heart disease Brother   . Cancer Brother   . Diabetes Brother   . Stroke Brother   . Heart disease Sister   . Cancer Maternal Grandfather   . Hypothyroidism Daughter   . Hypertension Other   . Colon cancer Neg Hx     Social History Social History   Tobacco Use  . Smoking status: Former Smoker    Packs/day: 0.75    Years: 8.00    Pack years: 6.00    Types: Cigarettes    Last attempt to quit: 12/01/1990    Years since quitting: 26.6  . Smokeless tobacco: Never Used  . Tobacco comment: quit smoking in the 1990s  Substance Use Topics  . Alcohol use: No    Comment: 03/23/2015 "stopped drinking in 2012 w/gastric bypass; drank socially before bypass"  . Drug use: No     Allergies   Lyrica [pregabalin]; Flexeril [cyclobenzaprine hcl]; Morphine and related; Sulfamethoxazole-trimethoprim; Belsomra [suvorexant]; and Tape   Review of Systems Review of Systems  All other systems reviewed and are negative.    Physical Exam Updated Vital Signs BP (!) 94/57 (BP Location: Right Arm)   Pulse 72   Temp 97.6 F (36.4 C) (Oral)   Resp 18   Ht _0  (1.651 m)   Wt 95.3 kg (210 lb)   SpO2 100%   BMI 34.95 kg/m   Physical Exam  Constitutional: She is oriented to person, place, and time. She appears well-developed and well-nourished. She appears distressed (She is uncomfortable).  HENT:  Head: Normocephalic and atraumatic.  Eyes: Pupils are equal, round, and reactive to light. Conjunctivae and EOM are normal.  Neck: Normal range of motion and phonation normal. Neck supple.    Cardiovascular: Normal rate and regular rhythm.  Pulmonary/Chest: Effort normal and breath sounds normal. She exhibits no tenderness.  Abdominal: Soft. She exhibits no distension and no mass. There is tenderness (Left upper and lower, moderate.). There is no rebound and no guarding. No hernia.  Genitourinary:  Genitourinary Comments: Tender left flank to light percussion.  Musculoskeletal: Normal range of motion.  Neurological: She is alert and oriented to person, place, and time. She exhibits normal muscle tone.  Skin: Skin is warm and dry.  Psychiatric: She has a normal mood and affect. Her behavior is normal. Judgment and thought content normal.  Nursing note and vitals reviewed.    ED Treatments / Results  Labs (all labs ordered are listed, but only abnormal results are displayed) Labs Reviewed  URINALYSIS, ROUTINE W REFLEX MICROSCOPIC - Abnormal; Notable for the following components:      Result Value   Color, Urine ORANGE (*)    APPearance HAZY (*)    Nitrite POSITIVE (*)    All other components within normal limits  COMPREHENSIVE METABOLIC PANEL - Abnormal; Notable for the following components:   Glucose, Bld 107 (*)    Calcium 8.8 (*)  Total Protein 5.7 (*)    AST 71 (*)    ALT 153 (*)    Alkaline Phosphatase 127 (*)    All other components within normal limits  CBC WITH DIFFERENTIAL/PLATELET - Abnormal; Notable for the following components:   RBC 3.64 (*)    Hemoglobin 11.9 (*)    All other components within normal limits  LIPASE, BLOOD    EKG None  Radiology Ct Abdomen Pelvis W Contrast  Result Date: 07/26/2017 CLINICAL DATA:  left flank pain "for a couple of months." States she was treated for UTI, then had culture done and found out she had E-coli infection and started macrobid yesterday. States pain has worsened over the last week. Denies nausea or diarrhea. Also complaining of dysuria. EXAM: CT ABDOMEN AND PELVIS WITH CONTRAST TECHNIQUE: Multidetector CT  imaging of the abdomen and pelvis was performed using the standard protocol following bolus administration of intravenous contrast. CONTRAST:  157m ISOVUE-300 IOPAMIDOL (ISOVUE-300) INJECTION 61% COMPARISON:  03/04/2017 and previous FINDINGS: Lower chest: No acute abnormality. Hepatobiliary: No focal liver abnormality is seen. Status post cholecystectomy. No intrahepatic biliary dilatation. Stable distention of the CBD. Pancreas: Unremarkable. No pancreatic ductal dilatation or surrounding inflammatory changes. Spleen: Normal in size without focal abnormality. Adrenals/Urinary Tract: Adrenal glands are unremarkable. Kidneys are normal, without renal calculi, focal lesion, or hydronephrosis. Bladder is unremarkable. Stomach/Bowel: Changes of gastric bypass surgery. Stomach remnant is decompressed. Small bowel is nondilated. Normal appendix. Moderate colonic fecal material without dilatation or other focal lesion. Vascular/Lymphatic: No significant vascular findings are present. No enlarged abdominal or pelvic lymph nodes. Bilateral pelvic phleboliths. Reproductive: Status post hysterectomy. No adnexal masses. Other: No ascites. No free air. Tiny umbilical hernia containing only mesenteric fat. Musculoskeletal: Implanted dorsal epidural stimulator device, tips not visualized. Spondylitic changes in the lumbar spine. No fracture or worrisome bone lesion. IMPRESSION: 1. No acute findings. 2. postop changes as above. Electronically Signed   By: DLucrezia EuropeM.D.   On: 07/26/2017 10:40    Procedures Procedures (including critical care time)  Medications Ordered in ED Medications  0.9 %  sodium chloride infusion ( Intravenous Stopped 07/26/17 1222)  sodium chloride 0.9 % bolus 500 mL (0 mLs Intravenous Stopped 07/26/17 0913)  iopamidol (ISOVUE-300) 61 % injection 100 mL (100 mLs Intravenous Contrast Given 07/26/17 1001)     Initial Impression / Assessment and Plan / ED Course  I have reviewed the triage vital signs  and the nursing notes.  Pertinent labs & imaging results that were available during my care of the patient were reviewed by me and considered in my medical decision making (see chart for details).  Clinical Course as of Jul 27 1535  Fri Jul 26, 2017  0801 Normal, nitrite present  Urinalysis, Routine w reflex microscopic(!) [EW]    Clinical Course User Index [EW] WDaleen Bo MD     Patient Vitals for the past 24 hrs:  BP Temp Temp src Pulse Resp SpO2 Height Weight  07/26/17 1223 (!) 94/57 - - 72 18 100 % - -  07/26/17 1015 - - - 76 - (!) 81 % - -  07/26/17 1000 119/67 - - - - - - -  07/26/17 0930 95/71 - - (!) 54 - 96 % - -  07/26/17 0900 102/67 - - (!) 50 - 98 % - -  07/26/17 0713 - - - - - - _0  (1.651 m) 95.3 kg (210 lb)  07/26/17 0712 125/82 97.6 F (36.4 C) Oral (Marland Kitchen  58 20 100 % - -    At D/C Reevaluation with update and discussion. After initial assessment and treatment, an updated evaluation reveals patient is fairly comfortable.  She has no further complaints.  Findings discussed with the patient and her family member all questions were answered. [place] Decision Making: Nonspecific left flank pain and left abdominal pain.  Evaluation reassuring today.  Patient currently being treated for UTI.  She requested another prescription for Macrobid since she lost her first 1.  Doubt sepsis, metabolic instability or impending vascular collapse.  No evidence for acute intra-abdominal pathology.  CRITICAL CARE-no Performed by: Daleen Bo  Nursing Notes Reviewed/ Care Coordinated Applicable Imaging Reviewed Interpretation of Laboratory Data incorporated into ED treatment  The patient appears reasonably screened and/or stabilized for discharge and I doubt any other medical condition or other [facility] requiring further screening, evaluation, or treatment in the ED at this time prior to discharge.  Plan: Home Medications-continue current medications; Home  Treatments-drink plenty of fluid; return here if the recommended treatment, does not improve the symptoms; Recommended follow up-PCP checkup 1 week and as needed    Final Clinical Impressions(s) / ED Diagnoses   Final diagnoses:  Flank pain    ED Discharge Orders        Ordered    nitrofurantoin, macrocrystal-monohydrate, (MACROBID) 100 MG capsule  2 times daily     07/26/17 1217       Daleen Bo, MD 07/26/17 1538

## 2017-07-26 NOTE — Discharge Instructions (Addendum)
The testing today is reassuring.  Continue taking the medications for urinary tract infection, and your other usual medications.  Make sure that you are getting plenty of rest, drinking a lot of fluids.  Follow-up with your doctor as needed for problems.

## 2017-07-26 NOTE — ED Triage Notes (Signed)
Patient complaining of left flank pain "for a couple of months." States she was treated for UTI, then had culture done and found out she had E-coli infection and started macrobid yesterday. States pain has worsened over the last week. Denies nausea or diarrhea. Also complaining of dysuria.

## 2017-07-31 ENCOUNTER — Other Ambulatory Visit: Payer: Self-pay | Admitting: Neurology

## 2017-08-02 ENCOUNTER — Ambulatory Visit (INDEPENDENT_AMBULATORY_CARE_PROVIDER_SITE_OTHER): Payer: Self-pay

## 2017-08-02 ENCOUNTER — Encounter (INDEPENDENT_AMBULATORY_CARE_PROVIDER_SITE_OTHER): Payer: Self-pay | Admitting: Physical Medicine and Rehabilitation

## 2017-08-02 ENCOUNTER — Ambulatory Visit (INDEPENDENT_AMBULATORY_CARE_PROVIDER_SITE_OTHER): Payer: PPO | Admitting: Physical Medicine and Rehabilitation

## 2017-08-02 DIAGNOSIS — M25552 Pain in left hip: Secondary | ICD-10-CM | POA: Diagnosis not present

## 2017-08-02 DIAGNOSIS — R252 Cramp and spasm: Secondary | ICD-10-CM | POA: Diagnosis not present

## 2017-08-02 NOTE — Progress Notes (Signed)
 .  Numeric Pain Rating Scale and Functional Assessment Average Pain 7   In the last MONTH (on 0-10 scale) has pain interfered with the following?  1. General activity like being  able to carry out your everyday physical activities such as walking, climbing stairs, carrying groceries, or moving a chair?  Rating(2)   +Driver, -BT, -Dye Allergies.

## 2017-08-02 NOTE — Patient Instructions (Signed)

## 2017-08-02 NOTE — Progress Notes (Signed)
Debbie Pineda - 57 y.o. female MRN [id number]  Date of birth: December 21, 1960  Office Visit Note: Visit Date: 08/02/2017 PCP: Jake Samples, PA-C Referred by: Jake Samples, PA*  Subjective: Chief Complaint  Patient presents with  . Left Hip - Pain  . Left Leg - Pain   HPI: Debbie Pineda is a 57 year old female with chronic pain syndrome history status post spinal cord stimulator for her lumbar spine and bilateral hip and leg pain.  Dr. Louanne Skye recently saw her and suggested right hip injection but since she has seen him she is been having more left hip and groin pain.  She reports a left hip pain that refers into the groin and anterior thigh that started really chronically sometime ago but the left is worse than the right.  She reports walking and moving makes the pain worse and medication makes it somewhat better.  She reports 7 out of 10 pain.  The spinal cord stimulator does not seem to help this pain very much.   ROS Otherwise per HPI.  Assessment & Plan: Visit Diagnoses:  1. Pain in left hip     Plan: No additional findings.   Meds & Orders: No orders of the defined types were placed in this encounter.   Orders Placed This Encounter  Procedures  . XR C-ARM NO REPORT    Follow-up: No follow-ups on file.   Procedures: Large Joint Inj: L hip joint on 08/02/2017 9:00 AM Indications: pain and diagnostic evaluation Details: 22 G needle, anterior approach  Arthrogram: Yes  Medications: 80 mg triamcinolone acetonide 40 MG/ML; 3 mL bupivacaine 0.5 % Outcome: tolerated well, no immediate complications  Arthrogram demonstrated excellent flow of contrast throughout the joint surface without extravasation or obvious defect.  The patient had relief of symptoms during the anesthetic phase of the injection.  Procedure, treatment alternatives, risks and benefits explained, specific risks discussed. Consent was given by the patient. Immediately prior to procedure a time out was called  to verify the correct patient, procedure, equipment, support staff and site/side marked as required. Patient was prepped and draped in the usual sterile fashion.      No notes on file   Clinical History: Lumbar CT myelogram 04/06/2016  IMPRESSION: 1. No acute or focal abnormality to explain the patient's thoracolumbar pain 2. Left paramedian calcified disc protrusion at T12-L1 without significant stenosis. 3. Mild osseous foraminal narrowing bilaterally at T10-11. 4. Mild bilateral foraminal narrowing at L1-2. 5. Mild left subarticular narrowing at L2-3 with moderate left and mild right foraminal stenosis. 6. Mild right subarticular narrowing at L3-4 with mild foraminal narrowing bilaterally, right greater than left. 7. Mild foraminal narrowing at L4-5 with moderate bilateral facet hypertrophy but no significant central canal stenosis. 8. Moderate facet hypertrophy and spurring is worse on the left with mild left subarticular narrowing potentially impacting the left S1 nerve root.  Lumbar spine MRI 01/09/2016  L2-3: Mild retrolisthesis. Mild disc and facet degeneration. Mild foraminal narrowing bilaterally.  L3-4: Mild retrolisthesis. Mild disc and facet degeneration. Mild right foraminal narrowing.  L4-5:  Mild disc and facet degeneration without significant stenosis  L5-S1: Moderate to advanced disc degeneration with disc space narrowing. Mild endplate spurring without significant stenosis.  IMPRESSION: Lumbar disc and facet degeneration is stable since 07/20/2014. Negative for disc protrusion or significant spinal stenosis. Mild right foraminal narrowing at L3-4.   She reports that she quit smoking about 26 years ago. Her smoking use included cigarettes. She has a 6.00 pack-year  smoking history. She has never used smokeless tobacco.  Recent Labs    03/25/17 1004 05/02/17 0419  HGBA1C  --  5.0  LABURIC 4.5  --     Objective:  VS:  HT:    WT:   BMI:     BP:    HR: bpm  TEMP: ( )  RESP:  Physical Exam  Ortho Exam Imaging: No results found.  Past Medical/Family/Surgical/Social History: Medications & Allergies reviewed per EMR, new medications updated. Patient Active Problem List   Diagnosis Date Noted  . History of diabetes mellitus 06/06/2017  . Primary osteoarthritis of right hand 06/06/2017  . Transaminasemia   . TIA (transient ischemic attack) 05/01/2017  . Dysphasia 05/01/2017  . Chronic pain syndrome 05/01/2017  . Confusion   . Presence of right artificial knee joint 09/17/2016  . H/O total knee replacement, right 08/15/2016  . Presence of retained hardware   . Memory disorder 08/14/2016  . Cerebrovascular disease 08/14/2016  . Unilateral primary osteoarthritis, right knee 05/29/2016  . Primary osteoarthritis of both feet 05/29/2016  . Trochanteric bursitis of both hips 05/25/2016  . Neck pain 05/25/2016  . Urinary urgency 04/19/2016  . Chronic low back pain 01/11/2016  . Depression 11/29/2015  . 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 Heart palpitations 12/14/2013  . Generalized osteoarthritis of multiple sites 11/04/2013  . Cerebral infarction ([place]) 10/30/2011  . PFO (patent foramen ovale) 10/30/2011  . Bradycardia 10/30/2011  . Chest pain 10/30/2011  . Hypothyroid   . Thrombophlebitis   . Sleep apnea   . Fibromyalgia   . 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 Status post bariatric surgery 12/07/2010  . Vein disorder 11/29/2010  . S/P total hysterectomy and bilateral salpingo-oophorectomy 11/29/2010  . S/P exploratory laparotomy 11/29/2010  . S/P cholecystectomy 11/29/2010  . S/P ACL surgery 11/29/2010  . Morbid obesity ([place]) 11/10/2010   Past Medical History:  Diagnosis Date  . ADD (attention deficit disorder)    takes Adderall daily  . Arthritis    "all over"  . Cerebral infarction ([place]) 10/30/2011  . Cerebrovascular disease 08/14/2016  . Chronic back pain    "all over"  . Chronic low  back pain 01/11/2016  . Complication of anesthesia    tends to have hypotension when NPO and post-anesthesia  . Constipation    takes stool softener daily  . Degenerative disk disease   . Degenerative joint disease   . Depression    takes Cymbalta daily for pain per pt  . Diabetes mellitus without complication ([place])   . DVT (deep venous thrombosis) ([facility])    RLE  . Family history of adverse reaction to anesthesia    a family member woke up during surgery; "think it was my mom"  . Fibromyalgia   . Generalized osteoarthritis of multiple sites 11/04/2013  . GERD (gastroesophageal reflux disease)   . Heart palpitations 12/14/2013  . History of blood clots    superficial  . Hypoglycemia   . Hypothyroid    takes Synthroid daily  . Incomplete emptying of bladder   . Insomnia    takes Trazodone nightly  . Iron deficiency anemia    takes Ferrous Sulfate daily  . Joint pain   . Joint swelling    knees and ankles  . Memory disorder 08/14/2016  . Morbid obesity ([place])   . Nausea    takes Zofran as needed.Seeing GI doc  . Neck pain 05/25/2016  . OSA on CPAP  tested more than 5 yrs ago.    . Osteoarthritis   . PFO (patent foramen ovale)   . Primary osteoarthritis of both feet 05/29/2016   Right bunionectomy August 2017 by Dr. Sharol Given  . Scoliosis   . Sleep apnea   . Stroke [facility]) "several"   right foot weakness; memory issues, black spot right visual field since" (03/23/2015)  . Thrombophlebitis   . Trochanteric bursitis of both hips 05/25/2016  . Unilateral primary osteoarthritis, right knee 05/29/2016  . Urinary urgency 04/19/2016  . Vein disorder 11/29/2010   Family History  Problem Relation Age of Onset  . Heart disease Father   . Cancer Father   . Cancer Mother        skin cancer   . Heart disease Brother   . Cancer Brother   . Diabetes Brother   . Stroke Brother   . Heart disease Sister   . Cancer Maternal Grandfather   . Hypothyroidism Daughter   . Hypertension Other   .  Colon cancer Neg Hx    Past Surgical History:  Procedure Laterality Date  . BUNIONECTOMY Right 08/2015  . CARDIAC CATHETERIZATION     2008.  "it was fine" (not sure why she had it done, and doesn't know where)  . COLONOSCOPY N/A 03/25/2013   Procedure: COLONOSCOPY;  Surgeon: Rogene Houston, MD;  [facility];  Service: Endoscopy;  Laterality: N/A;  930  . ESOPHAGOGASTRODUODENOSCOPY    . EXPLORATORY LAPAROTOMY     "took fallopian tubes out"  . JOINT REPLACEMENT     bil knee   . KNEE ARTHROPLASTY    . KNEE ARTHROSCOPY Left   . KNEE ARTHROSCOPY W/ ACL RECONSTRUCTION Right    "added pins"  . LAPAROSCOPIC CHOLECYSTECTOMY  ~ 2001  . 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 SPINAL CORD STIMULATOR INSERTION N/A 04/18/2017   Procedure: LUMBAR SPINAL CORD STIMULATOR INSERTION;  Surgeon: Clydell Hakim, MD;  Location: [place];  Service: Neurosurgery;  Laterality: N/A;  LUMBAR SPINAL CORD STIMULATOR INSERTION  . TOTAL KNEE ARTHROPLASTY Left 03/23/2015   Procedure: TOTAL KNEE ARTHROPLASTY;  Surgeon: Newt Minion, MD;  Location: [place];  Service: Orthopedics;  Laterality: Left;  . TOTAL KNEE ARTHROPLASTY Right 08/15/2016   Procedure: RIGHT TOTAL KNEE ARTHROPLASTY, REMOVAL ACL SCREWS;  Surgeon: Newt Minion, MD;  Location: [place];  Service: Orthopedics;  Laterality: Right;  . TOTAL KNEE ARTHROPLASTY WITH HARDWARE REMOVAL Right   . VAGINAL HYSTERECTOMY    . VARICOSE VEIN SURGERY Right X 2   Social History   Occupational History  . Occupation: Disability  . Occupation: formerly Therapist, sports, [employer]  Tobacco Use  . Smoking status: Former Smoker    Packs/day: 0.75    Years: 8.00    Pack years: 6.00    Types: Cigarettes    Last attempt to quit: 12/01/1990    Years since quitting: 26.6  . Smokeless tobacco: Never Used  . Tobacco comment: quit smoking in the 1990s  Substance and Sexual Activity  . Alcohol use: No    Comment: 03/23/2015 "stopped drinking in 2012 w/gastric bypass; drank socially before  bypass"  . Drug use: No  . Sexual activity: Not Currently    Birth control/protection: Surgical

## 2017-08-05 DIAGNOSIS — Z6834 Body mass index (BMI) 34.0-34.9, adult: Secondary | ICD-10-CM | POA: Diagnosis not present

## 2017-08-05 DIAGNOSIS — E6609 Other obesity due to excess calories: Secondary | ICD-10-CM | POA: Diagnosis not present

## 2017-08-05 DIAGNOSIS — M797 Fibromyalgia: Secondary | ICD-10-CM | POA: Diagnosis not present

## 2017-08-05 DIAGNOSIS — M791 Myalgia, unspecified site: Secondary | ICD-10-CM | POA: Diagnosis not present

## 2017-08-05 MED ORDER — BUPIVACAINE HCL 0.5 % IJ SOLN
3.0000 mL | INTRAMUSCULAR | Status: AC | PRN
  Administered 2017-08-02: 3 mL via INTRA_ARTICULAR

## 2017-08-05 MED ORDER — TRIAMCINOLONE ACETONIDE 40 MG/ML IJ SUSP
80.0000 mg | INTRAMUSCULAR | Status: AC | PRN
  Administered 2017-08-02: 80 mg via INTRA_ARTICULAR

## 2017-08-06 DIAGNOSIS — C44519 Basal cell carcinoma of skin of other part of trunk: Secondary | ICD-10-CM | POA: Diagnosis not present

## 2017-08-06 DIAGNOSIS — D225 Melanocytic nevi of trunk: Secondary | ICD-10-CM | POA: Diagnosis not present

## 2017-08-06 DIAGNOSIS — Z1283 Encounter for screening for malignant neoplasm of skin: Secondary | ICD-10-CM | POA: Diagnosis not present

## 2017-08-13 DIAGNOSIS — F909 Attention-deficit hyperactivity disorder, unspecified type: Secondary | ICD-10-CM | POA: Diagnosis not present

## 2017-08-14 ENCOUNTER — Other Ambulatory Visit: Payer: Self-pay | Admitting: Orthopedic Surgery

## 2017-08-14 DIAGNOSIS — M1811 Unilateral primary osteoarthritis of first carpometacarpal joint, right hand: Secondary | ICD-10-CM | POA: Diagnosis not present

## 2017-08-16 ENCOUNTER — Encounter: Payer: Self-pay | Admitting: Vascular Surgery

## 2017-08-16 ENCOUNTER — Other Ambulatory Visit: Payer: Self-pay

## 2017-08-16 ENCOUNTER — Ambulatory Visit: Payer: PPO | Admitting: Vascular Surgery

## 2017-08-16 ENCOUNTER — Ambulatory Visit (HOSPITAL_COMMUNITY)
Admission: RE | Admit: 2017-08-16 | Discharge: 2017-08-16 | Disposition: A | Discharge status: Home / Self Care | Payer: PPO | Source: Ambulatory Visit | Attending: Vascular Surgery | Admitting: Vascular Surgery

## 2017-08-16 VITALS — BP 104/71 | HR 71 | Resp 20 | Ht 65.0 in | Wt 214.5 lb

## 2017-08-16 DIAGNOSIS — I83893 Varicose veins of bilateral lower extremities with other complications: Secondary | ICD-10-CM

## 2017-08-16 NOTE — Progress Notes (Signed)
Patient ID: Debbie Pineda, female   DOB: 01-May-1960, 57 y.o.   MRN: [id number]  Reason for Consult: New Patient (Initial Visit) (eval bil vv's c/o R LE swelling and discomfort)   Referred by Jake Samples, PA*  Subjective:     HPI:  Debbie Pineda is a 57 y.o. female female with history of strokes presumably from DVT and PFO.  She is now maintained on Xarelto.  She is undergone gastric bypass and has suddenly lost a lot of weight.  She notices swelling in her right greater than left leg.  She also has bilateral varicose veins in the thighs down into her ankles as well as spider veins.  She is here today for discussion of treatment.  She is not worried about the cosmetic effects.  She does have fibromyalgia has attempted to wear compression stockings in the past but this causes too much skin pain.  She has had 2 previous operative procedures on her right greater saphenous vein.  Past Medical History:  Diagnosis Date  . ADD (attention deficit disorder)    takes Adderall daily  . Arthritis    "all over"  . Cerebral infarction ([place]) 10/30/2011  . Cerebrovascular disease 08/14/2016  . Chronic back pain    "all over"  . Chronic low back pain 01/11/2016  . Complication of anesthesia    tends to have hypotension when NPO and post-anesthesia  . Constipation    takes stool softener daily  . Degenerative disk disease   . Degenerative joint disease   . Depression    takes Cymbalta daily for pain per pt  . Diabetes mellitus without complication ([place])   . DVT (deep venous thrombosis) (H[facility])    RLE  . Family history of adverse reaction to anesthesia    a family member woke up during surgery; "think it was my mom"  . Fibromyalgia   . Generalized osteoarthritis of multiple sites 11/04/2013  . GERD (gastroesophageal reflux disease)   . Heart palpitations 12/14/2013  . History of blood clots    superficial  . Hypoglycemia   . Hypothyroid    takes Synthroid daily  . Incomplete  emptying of bladder   . Insomnia    takes Trazodone nightly  . Iron deficiency anemia    takes Ferrous Sulfate daily  . Joint pain   . Joint swelling    knees and ankles  . Memory disorder 08/14/2016  . Morbid obesity ([place])   . Nausea    takes Zofran as needed.Seeing GI doc  . Neck pain 05/25/2016  . OSA on CPAP    tested more than 5 yrs ago.    . Osteoarthritis   . PFO (patent foramen ovale)   . Primary osteoarthritis of both feet 05/29/2016   Right bunionectomy August 2017 by Dr. Sharol Given  . Scoliosis   . Sleep apnea   . Stroke [facility]) "several"   right foot weakness; memory issues, black spot right visual field since" (03/23/2015)  . Thrombophlebitis   . Trochanteric bursitis of both hips 05/25/2016  . Unilateral primary osteoarthritis, right knee 05/29/2016  . Urinary urgency 04/19/2016  . Vein disorder 11/29/2010   Family History  Problem Relation Age of Onset  . Heart disease Father   . Cancer Father   . Cancer Mother        skin cancer   . Heart disease Brother   . Cancer Brother   . Diabetes Brother   . Stroke Brother   .  Heart disease Sister   . Cancer Maternal Grandfather   . Hypothyroidism Daughter   . Hypertension Other   . Colon cancer Neg Hx    Past Surgical History:  Procedure Laterality Date  . BUNIONECTOMY Right 08/2015  . CARDIAC CATHETERIZATION     2008.  "it was fine" (not sure why she had it done, and doesn't know where)  . COLONOSCOPY N/A 03/25/2013   Procedure: COLONOSCOPY;  Surgeon: Rogene Houston, MD;  [facility];  Service: Endoscopy;  Laterality: N/A;  930  . ESOPHAGOGASTRODUODENOSCOPY    . EXPLORATORY LAPAROTOMY     "took fallopian tubes out"  . JOINT REPLACEMENT     bil knee   . KNEE ARTHROPLASTY    . KNEE ARTHROSCOPY Left   . KNEE ARTHROSCOPY W/ ACL RECONSTRUCTION Right    "added pins"  . LAPAROSCOPIC CHOLECYSTECTOMY  ~ 2001  . 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 SPINAL CORD STIMULATOR INSERTION N/A 04/18/2017   Procedure:  LUMBAR SPINAL CORD STIMULATOR INSERTION;  Surgeon: Clydell Hakim, MD;  Location: [place];  Service: Neurosurgery;  Laterality: N/A;  LUMBAR SPINAL CORD STIMULATOR INSERTION  . TOTAL KNEE ARTHROPLASTY Left 03/23/2015   Procedure: TOTAL KNEE ARTHROPLASTY;  Surgeon: Newt Minion, MD;  Location: [place];  Service: Orthopedics;  Laterality: Left;  . TOTAL KNEE ARTHROPLASTY Right 08/15/2016   Procedure: RIGHT TOTAL KNEE ARTHROPLASTY, REMOVAL ACL SCREWS;  Surgeon: Newt Minion, MD;  Location: [place];  Service: Orthopedics;  Laterality: Right;  . TOTAL KNEE ARTHROPLASTY WITH HARDWARE REMOVAL Right   . VAGINAL HYSTERECTOMY    . VARICOSE VEIN SURGERY Right X 2    Short Social History:  Social History   Tobacco Use  . Smoking status: Former Smoker    Packs/day: 0.75    Years: 8.00    Pack years: 6.00    Types: Cigarettes    Last attempt to quit: 12/01/1990    Years since quitting: 26.7  . Smokeless tobacco: Never Used  . Tobacco comment: quit smoking in the 1990s  Substance Use Topics  . Alcohol use: No    Comment: 03/23/2015 "stopped drinking in 2012 w/gastric bypass; drank socially before bypass"    Allergies  Allergen Reactions  . Lyrica [Pregabalin] Other (See Comments)    SOB, lower extremity edema and weight gain  . Flexeril [Cyclobenzaprine Hcl] Other (See Comments)    Numbness of extremities  . Morphine And Related Itching    Upper torso  . Sulfamethoxazole-Trimethoprim Itching and Rash    Bactrim  . Belsomra [Suvorexant] Other (See Comments)    unknown  . Tape Itching and Rash    Please use "paper" tape Rash if left on longer than 24 hrs    Current Outpatient Medications  Medication Sig Dispense Refill  . baclofen (LIORESAL) 10 MG tablet Take 10 mg by mouth 3 (three) times daily as needed.  1  . Continuous Blood Gluc Sensor (FREESTYLE LIBRE 14 DAY SENSOR) MISC     . Cyanocobalamin (B-12) 5000 MCG SUBL Place 5,000 mcg under the tongue daily at 12 noon.    . diclofenac sodium  (VOLTAREN) 1 % GEL Apply 4 g topically 4 (four) times daily as needed (PAIN).     Marland Kitchen donepezil (ARICEPT) 10 MG tablet Take 1 tablet (10 mg total) by mouth at bedtime. 30 tablet 3  . DULoxetine (CYMBALTA) 60 MG capsule Take 60 mg by mouth 2 (two) times daily.  11  . folic acid (FOLVITE)  400 MCG tablet Take 400 mcg by mouth daily.    . Fremanezumab-vfrm (AJOVY) 225 MG/1.5ML SOSY Inject 225 mg into the skin every 30 (thirty) days. 1.5 mL 5  . gabapentin (NEURONTIN) 600 MG tablet Take 600 mg by mouth 3 (three) times daily.    Marland Kitchen KRILL OIL PO Take by mouth.    Marland Kitchen L-Methylfolate-B12-B6-B2 (CEREFOLIN PO) Take by mouth.    . levothyroxine (SYNTHROID, LEVOTHROID) 125 MCG tablet Take 125 mcg by mouth daily before breakfast.    . methylphenidate (RITALIN) 20 MG tablet Take 10 mg by mouth 5 (five) times daily. Between 0800 and 1600. Takes 1/2 tablet up to 5 times daily    . mirabegron ER (MYRBETRIQ) 50 MG TB24 tablet Take 50 mg by mouth daily.    . Multiple Minerals-Vitamins (CAL-MAG-ZINC-D PO) Take 1-2 tablets by mouth See admin instructions. Take 2 tablet at 1200 and 1 tablet at 1600  Calcium 1000 mg Vit D 600 iu Magnesium 400 mg Zinc 15 mg    . NARCAN 4 MG/0.1ML LIQD nasal spray kit Place 1 spray into the nose as needed (over dose).     . Neomy-Bacit-Polymyx-Pramoxine (NEOSPORIN + PAIN RELIEF MAX ST) 1 % OINT Apply 1 application topically as needed (wound care).    . nitrofurantoin, macrocrystal-monohydrate, (MACROBID) 100 MG capsule Take 1 capsule (100 mg total) by mouth 2 (two) times daily. X 7 days 14 capsule 0  . omeprazole (PRILOSEC) 40 MG capsule Take 1 capsule by mouth twice daily. 60 capsule 4  . ondansetron (ZOFRAN-ODT) 4 MG disintegrating tablet Take 4 mg by mouth every 6 (six) hours as needed for nausea.   2  . Oxycodone HCl 10 MG TABS Take 1 tablet (10 mg total) by mouth every 4 (four) hours as needed. 30 tablet 0  . Polyvinyl Alcohol-Povidone (REFRESH OP) Place 1-2 drops into both eyes 3  (three) times daily as needed (dry eyes).    . rivaroxaban (XARELTO) 20 MG TABS tablet Take 1 tablet (20 mg total) by mouth daily with supper. (Patient taking differently: Take 20 mg by mouth at bedtime. ) 30 tablet 1  . topiramate (TOPAMAX) 50 MG tablet One tablet in the morning and 2 in the evening (Patient taking differently: Take 100 mg by mouth 2 (two) times daily. One tablet in the morning and 2 in the evening) 90 tablet 3  . traMADol (ULTRAM) 50 MG tablet Take 50 mg by mouth 3 (three) times daily as needed.   5  . traZODone (DESYREL) 100 MG tablet Take 100-200 mg by mouth at bedtime as needed for sleep (depends on insomnia).     Marland Kitchen UNABLE TO FIND Apply 1 application topically 2 (two) times daily as needed (pain). Med Name: C-Anti-Inflammatory Cream  Diclofenac 3% / Baclofen 2% / Cyclobenzaprine 2% / Lidocaine 2%    . Vitamin D, Ergocalciferol, (DRISDOL) 50000 UNITS CAPS Take 50,000 Units by mouth every 7 (seven) days. Mondays     No current facility-administered medications for this visit.     Review of Systems  Constitutional: Positive for fatigue.  HENT: HENT negative.  Eyes: Eyes negative.  Respiratory: Positive for shortness of breath and wheezing.  Cardiovascular: Positive for irregular heartbeat, leg swelling and palpitations.  GI: Gastrointestinal negative.  Musculoskeletal: Positive for leg pain.  Skin: Skin negative.  Neurological: Positive for dizziness, headaches, light-headedness, numbness and speech difficulty.  Hematologic: Positive for bruises/bleeds easily.  Psychiatric: Psychiatric negative.        Objective:  Objective  Vitals:   08/16/17 1349  BP: 104/71  Pulse: 71  Resp: 20  SpO2: 98%  Weight: 214 lb 8 oz (97.3 kg)  Height: 5' 5" (1.651 m)   Body mass index is 35.69 kg/m.  Physical Exam  Constitutional: She appears well-developed.  HENT:  Head: Normocephalic.  Eyes: Pupils are equal, round, and reactive to light.  Neck: Normal range of motion.   Cardiovascular: Normal rate.  Pulses:      Radial pulses are 2+ on the right side, and 2+ on the left side.       Dorsalis pedis pulses are 2+ on the right side, and 2+ on the left side.  Abdominal: Soft.  Musculoskeletal: Normal range of motion.  Trace bilateral ankle edema  Skin: Skin is warm and dry.  Psychiatric: She has a normal mood and affect. Her behavior is normal. Judgment and thought content normal.    Data: -I have independently interpreted her bilateral lower extremity reflux studies which do demonstrate significant reflux throughout her greater saphenous veins bilaterally.  Her GSV at the right junctions 1.09 cm left 0.66 cm.  At the knee they are 0.22 right 0.25 left.     Assessment/Plan:     57yo female with bilateral lower extremity swelling that is multifactorial nature.  She also has varicose veins bilaterally.  She has a history of DVT in the right lower extremity which is the most symptomatic leg and I think from the standpoint has some element of post thrombotic syndrome.  Her greater saphenous veins do have reflux bilaterally however they are quite small and I am not sure that ablating then would even be possible most poorly would not be helpful.  She could have injection of her varicose veins but she is not interested in the cosmesis.  Unfortunately due to her fibromyalgia she states she cannot wear strict compression stockings because it causes severe pain.  I discussed with her the different types including wraps which she would possibly be amenable to.  She can follow-up here on a as needed basis.     Waynetta Sandy MD [facility]

## 2017-08-19 ENCOUNTER — Other Ambulatory Visit (INDEPENDENT_AMBULATORY_CARE_PROVIDER_SITE_OTHER): Payer: Self-pay | Admitting: *Deleted

## 2017-08-19 ENCOUNTER — Encounter (INDEPENDENT_AMBULATORY_CARE_PROVIDER_SITE_OTHER): Payer: Self-pay | Admitting: *Deleted

## 2017-08-19 DIAGNOSIS — R1011 Right upper quadrant pain: Secondary | ICD-10-CM

## 2017-08-20 ENCOUNTER — Ambulatory Visit: Payer: PPO | Admitting: Registered"

## 2017-08-21 ENCOUNTER — Encounter (HOSPITAL_BASED_OUTPATIENT_CLINIC_OR_DEPARTMENT_OTHER): Payer: Self-pay | Admitting: *Deleted

## 2017-08-21 NOTE — Progress Notes (Signed)
Spoke to Debbie Pineda in Dr. Levell July office regarding Bynum Bellows.  She will contact prescribing physician for instructions and will contact patient and send clearance.  Called patient to let her know office will call her with instructions.

## 2017-08-26 ENCOUNTER — Ambulatory Visit (INDEPENDENT_AMBULATORY_CARE_PROVIDER_SITE_OTHER): Payer: PPO | Admitting: Otolaryngology

## 2017-08-26 ENCOUNTER — Ambulatory Visit (INDEPENDENT_AMBULATORY_CARE_PROVIDER_SITE_OTHER): Payer: PPO | Admitting: Adult Health

## 2017-08-26 ENCOUNTER — Encounter: Payer: Self-pay | Admitting: Adult Health

## 2017-08-26 VITALS — BP 110/62 | HR 91 | Ht 65.0 in | Wt 218.8 lb

## 2017-08-26 DIAGNOSIS — J310 Chronic rhinitis: Secondary | ICD-10-CM | POA: Diagnosis not present

## 2017-08-26 DIAGNOSIS — J343 Hypertrophy of nasal turbinates: Secondary | ICD-10-CM | POA: Diagnosis not present

## 2017-08-26 DIAGNOSIS — R0982 Postnasal drip: Secondary | ICD-10-CM

## 2017-08-26 DIAGNOSIS — R1011 Right upper quadrant pain: Secondary | ICD-10-CM | POA: Diagnosis not present

## 2017-08-26 DIAGNOSIS — G43019 Migraine without aura, intractable, without status migrainosus: Secondary | ICD-10-CM | POA: Diagnosis not present

## 2017-08-26 MED ORDER — KETOROLAC TROMETHAMINE 60 MG/2ML IM SOLN
30.0000 mg | Freq: Once | INTRAMUSCULAR | Status: AC
  Administered 2017-08-26: 30 mg via INTRAMUSCULAR

## 2017-08-26 NOTE — Progress Notes (Signed)
Gave Toradol 30mg  IM Left upper outer quadrant.  Tolerated well.  Bandaide applied.

## 2017-08-26 NOTE — Progress Notes (Signed)
I have read the note, and I agree with the clinical assessment and plan.  Charles K Willis   

## 2017-08-26 NOTE — Progress Notes (Signed)
PATIENT: Debbie Pineda DOB: 04-10-1960  REASON FOR VISIT: follow up HISTORY FROM: patient  HISTORY OF PRESENT ILLNESS: Today 08/26/17: [name] is a 57 year old female with a history of migraine headaches.  She returns today to discuss her migraines.  She was started on Ajovy.  She reports that after her first injection her headaches decreased from 3-4 headaches a day to 3-4 headaches a week.  She states that she took her second injection July 22.  She continues on Topamax 100 mg twice a day.  She reports that she woke up with a headache this morning.  She reports that it feels like she has pressure behind the eyes.  She also has photophobia.  We discussed acute treatment however the patient is very limited.  She has history of a stroke so she is unable to take triptans.  She also reports that her GI physician has instructed her not to take any NSAIDs.  She reports that she has taken Excedrin Migraine in the past with minimal benefit.  She states that she did take an old Imitrex when she had a bad headache.  The patient also has elevated liver enzymes therefore we are unable to do a Depakote infusion today.  She returns today for evaluation.  HISTORY  07/11/17: [name] is a 57 year old female with a history of chronic low back pain, fibromyalgia, history of stroke, migraine headaches, seizures and cerebrovascular disease.  She returns today for follow-up.  Her main concern today is headaches.  She states that she has approximately 2 headaches each day.  She states that her headache typically starts at her forehead and extends to the back of her head.  She states sometimes it can occur in the neck.  She reports with her headaches she does have light and noise sensitivity.  Occasionally will have nausea.  She states that it feels like a lot of pressure behind her eyes.  She reports that she typically has to lay down to manage her headaches.  States that she is unable to take NSAIDs.   Reports that she is also unable to take Tylenol due to liver function.  She is on multiple medications for pain and fibromyalgia.  Those include tramadol, oxycodone, gabapentin, Cymbalta, Topamax and baclofen.  She reports that her primary care increase Topamax 100 mg twice a day probably 1 month ago.  She reports that it has not offered her any benefit with her headaches.  She does report that baclofen was recently started but she is unsure if that correlates with the increase in her headaches.  She is followed by her primary care provider.  She does not see a pain specialist.  She reports that she has not had any seizure events.  She returns today for evaluation.    REVIEW OF SYSTEMS: Out of a complete 14 system review of symptoms, the patient complains only of the following symptoms, and all other reviewed systems are negative.  See HPI  ALLERGIES: Allergies  Allergen Reactions  . Lyrica [Pregabalin] Other (See Comments)    SOB, lower extremity edema and weight gain  . Flexeril [Cyclobenzaprine Hcl] Other (See Comments)    Numbness of extremities  . Morphine And Related Itching    Upper torso  . Sulfamethoxazole-Trimethoprim Itching and Rash    Bactrim  . Belsomra [Suvorexant] Other (See Comments)    unknown  . Tape Itching and Rash    Please use "paper" tape Rash if left on longer than 24 hrs  HOME MEDICATIONS: Outpatient Medications Prior to Visit  Medication Sig Dispense Refill  . baclofen (LIORESAL) 10 MG tablet Take 10 mg by mouth 3 (three) times daily as needed.  1  . Continuous Blood Gluc Sensor (FREESTYLE LIBRE 14 DAY SENSOR) MISC     . Cyanocobalamin (B-12) 5000 MCG SUBL Place 5,000 mcg under the tongue daily at 12 noon.    . diclofenac sodium (VOLTAREN) 1 % GEL Apply 4 g topically 4 (four) times daily as needed (PAIN).     Marland Kitchen donepezil (ARICEPT) 10 MG tablet Take 1 tablet (10 mg total) by mouth at bedtime. 30 tablet 3  . DULoxetine (CYMBALTA) 60 MG capsule Take 60 mg  by mouth 2 (two) times daily.  11  . folic acid (FOLVITE) 937 MCG tablet Take 400 mcg by mouth daily.    . Fremanezumab-vfrm (AJOVY) 225 MG/1.5ML SOSY Inject 225 mg into the skin every 30 (thirty) days. 1.5 mL 5  . gabapentin (NEURONTIN) 600 MG tablet Take 600 mg by mouth 3 (three) times daily.    Marland Kitchen KRILL OIL PO Take by mouth.    Marland Kitchen L-Methylfolate-B12-B6-B2 (CEREFOLIN PO) Take by mouth.    . levothyroxine (SYNTHROID, LEVOTHROID) 125 MCG tablet Take 125 mcg by mouth daily before breakfast.    . methylphenidate (RITALIN) 20 MG tablet Take 10 mg by mouth 5 (five) times daily. Between 0800 and 1600. Takes 1/2 tablet up to 5 times daily    . mirabegron ER (MYRBETRIQ) 50 MG TB24 tablet Take 50 mg by mouth daily.    . Multiple Minerals-Vitamins (CAL-MAG-ZINC-D PO) Take 1-2 tablets by mouth See admin instructions. Take 2 tablet at 1200 and 1 tablet at 1600  Calcium 1000 mg Vit D 600 iu Magnesium 400 mg Zinc 15 mg    . Multiple Vitamins-Minerals (ONE-A-DAY WOMENS PETITES) TABS Take 1 tablet by mouth 2 (two) times daily.    . mupirocin ointment (BACTROBAN) 2 % Place 1 application into the nose as needed.    Marland Kitchen NARCAN 4 MG/0.1ML LIQD nasal spray kit Place 1 spray into the nose as needed (over dose).     Marland Kitchen omeprazole (PRILOSEC) 40 MG capsule Take 1 capsule by mouth twice daily. 60 capsule 4  . ondansetron (ZOFRAN-ODT) 4 MG disintegrating tablet Take 4 mg by mouth every 6 (six) hours as needed for nausea.   2  . Oxycodone HCl 10 MG TABS Take 1 tablet (10 mg total) by mouth every 4 (four) hours as needed. (Patient taking differently: Take 10 mg by mouth 3 (three) times daily as needed. ) 30 tablet 0  . Polyvinyl Alcohol-Povidone (REFRESH OP) Place 1-2 drops into both eyes 3 (three) times daily as needed (dry eyes).    . rivaroxaban (XARELTO) 20 MG TABS tablet Take 1 tablet (20 mg total) by mouth daily with supper. (Patient taking differently: Take 20 mg by mouth at bedtime. ) 30 tablet 1  . topiramate  (TOPAMAX) 100 MG tablet Take 100 mg by mouth 2 (two) times daily.    . traZODone (DESYREL) 100 MG tablet Take 100-200 mg by mouth at bedtime as needed for sleep (depends on insomnia).     Marland Kitchen UNABLE TO FIND Apply 1 application topically 2 (two) times daily as needed (pain). Med Name: C-Anti-Inflammatory Cream  Diclofenac 3% / Baclofen 2% / Cyclobenzaprine 2% / Lidocaine 2%    . Vitamin D, Ergocalciferol, (DRISDOL) 50000 UNITS CAPS Take 50,000 Units by mouth every 7 (seven) days. Mondays    . Neomy-Bacit-Polymyx-Pramoxine (  NEOSPORIN + PAIN RELIEF MAX ST) 1 % OINT Apply 1 application topically as needed (wound care).    . NONFORMULARY OR COMPOUNDED ITEM C antiflammatory cream uses prn (diclofena, baclofen, cyclobenzaprine, lidocaine).    . topiramate (TOPAMAX) 50 MG tablet One tablet in the morning and 2 in the evening (Patient not taking: Reported on 08/26/2017) 90 tablet 3   No facility-administered medications prior to visit.     PAST MEDICAL HISTORY: Past Medical History:  Diagnosis Date  . ADD (attention deficit disorder)    takes Adderall daily  . Arthritis    "all over"  . Cerebral infarction ([place]) 10/30/2011  . Cerebrovascular disease 08/14/2016  . Chronic back pain    "all over"  . Chronic low back pain 01/11/2016  . Complication of anesthesia    tends to have hypotension when NPO and post-anesthesia  . Constipation    takes stool softener daily  . Degenerative disk disease   . Degenerative joint disease   . Depression    takes Cymbalta daily for pain per pt  . Diabetes mellitus without complication ([place])   . DVT (deep venous thrombosis) ([facility])    RLE  . Family history of adverse reaction to anesthesia    a family member woke up during surgery; "think it was my mom"  . Fibromyalgia   . Generalized osteoarthritis of multiple sites 11/04/2013  . GERD (gastroesophageal reflux disease)   . Heart palpitations 12/14/2013  . History of blood clots    superficial  . Hypoglycemia     . Hypothyroid    takes Synthroid daily  . Incomplete emptying of bladder   . Insomnia    takes Trazodone nightly  . Iron deficiency anemia    takes Ferrous Sulfate daily  . Joint pain   . Joint swelling    knees and ankles  . Memory disorder 08/14/2016  . Morbid obesity ([place])   . Nausea    takes Zofran as needed.Seeing GI doc  . Neck pain 05/25/2016  . OSA on CPAP    tested more than 5 yrs ago.    . Osteoarthritis   . PFO (patent foramen ovale)   . Primary osteoarthritis of both feet 05/29/2016   Right bunionectomy August 2017 by Dr. Sharol Given  . Scoliosis   . Sleep apnea   . Stroke [facility]) "several"   right foot weakness; memory issues, black spot right visual field since" (03/23/2015)  . Thrombophlebitis   . Trochanteric bursitis of both hips 05/25/2016  . Unilateral primary osteoarthritis, right knee 05/29/2016  . Urinary urgency 04/19/2016  . Vein disorder 11/29/2010    PAST SURGICAL HISTORY: Past Surgical History:  Procedure Laterality Date  . BUNIONECTOMY Right 08/2015  . CARDIAC CATHETERIZATION     2008.  "it was fine" (not sure why she had it done, and doesn't know where)  . COLONOSCOPY N/A 03/25/2013   Procedure: COLONOSCOPY;  Surgeon: Rogene Houston, MD;  Location[facility];  Service: Endoscopy;  Laterality: N/A;  930  . ESOPHAGOGASTRODUODENOSCOPY    . EXPLORATORY LAPAROTOMY     "took fallopian tubes out"  . JOINT REPLACEMENT     bil knee   . KNEE ARTHROPLASTY    . KNEE ARTHROSCOPY Left   . KNEE ARTHROSCOPY W/ ACL RECONSTRUCTION Right    "added pins"  . LAPAROSCOPIC CHOLECYSTECTOMY  ~ 2001  . 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 SPINAL CORD STIMULATOR INSERTION N/A 04/18/2017   Procedure: LUMBAR SPINAL CORD STIMULATOR INSERTION;  Surgeon: Clydell Hakim, MD;  Location: [place];  Service: Neurosurgery;  Laterality: N/A;  LUMBAR SPINAL CORD STIMULATOR INSERTION  . TOTAL KNEE ARTHROPLASTY Left 03/23/2015   Procedure: TOTAL KNEE ARTHROPLASTY;  Surgeon: Newt Minion, MD;   Location: [place];  Service: Orthopedics;  Laterality: Left;  . TOTAL KNEE ARTHROPLASTY Right 08/15/2016   Procedure: RIGHT TOTAL KNEE ARTHROPLASTY, REMOVAL ACL SCREWS;  Surgeon: Newt Minion, MD;  Location: [place];  Service: Orthopedics;  Laterality: Right;  . TOTAL KNEE ARTHROPLASTY WITH HARDWARE REMOVAL Right   . VAGINAL HYSTERECTOMY    . VARICOSE VEIN SURGERY Right X 2    FAMILY HISTORY: Family History  Problem Relation Age of Onset  . Heart disease Father   . Cancer Father   . Cancer Mother        skin cancer   . Heart disease Brother   . Cancer Brother   . Diabetes Brother   . Stroke Brother   . Heart disease Sister   . Cancer Maternal Grandfather   . Hypothyroidism Daughter   . Hypertension Other   . Colon cancer Neg Hx     SOCIAL HISTORY: Social History   Socioeconomic History  . Marital status: Married    Spouse name: Jeneen Rinks  . Number of children: 1  . Years of education: 42  . Highest education level: Not on file  Occupational History  . Occupation: Disability  . Occupation: formerly Therapist, sports, [employer]  Social Needs  . Financial resource strain: Not on file  . Food insecurity:    Worry: Not on file    Inability: Not on file  . Transportation needs:    Medical: Not on file    Non-medical: Not on file  Tobacco Use  . Smoking status: Former Smoker    Packs/day: 0.75    Years: 8.00    Pack years: 6.00    Types: Cigarettes    Last attempt to quit: 12/01/1990    Years since quitting: 26.7  . Smokeless tobacco: Never Used  . Tobacco comment: quit smoking in the 1990s  Substance and Sexual Activity  . Alcohol use: No    Comment: 03/23/2015 "stopped drinking in 2012 w/gastric bypass; drank socially before bypass"  . Drug use: No  . Sexual activity: Not Currently    Birth control/protection: Surgical  Lifestyle  . Physical activity:    Days per week: Not on file    Minutes per session: Not on file  . Stress: Not on file  Relationships  . Social connections:     Talks on phone: Not on file    Gets together: Not on file    Attends religious service: Not on file    Active member of club or organization: Not on file    Attends meetings of clubs or organizations: Not on file    Relationship status: Not on file  . Intimate partner violence:    Fear of current or ex partner: Not on file    Emotionally abused: Not on file    Physically abused: Not on file    Forced sexual activity: Not on file  Other Topics Concern  . Not on file  Social History Narrative   Lives with husband   Caffeine use: No soda   Mainly water, drinks decaf tea   Right handed      PHYSICAL EXAM  Vitals:   08/26/17 0815  BP: 110/62  Pulse: 91  Weight: 218 lb 12.8 oz (99.2 kg)  Height:  5' 5" (1.651 m)   Body mass index is 36.41 kg/m.  Generalized: Well developed, in no acute distress   Neurological examination  Mentation: Alert oriented to time, place, history taking. Follows all commands speech and language fluent Cranial nerve II-XII: Pupils were equal round reactive to light. Extraocular movements were full, visual field were full on confrontational test. Facial sensation and strength were normal. Uvula tongue midline. Head turning and shoulder shrug  were normal and symmetric. Motor: The motor testing reveals 5 over 5 strength of all 4 extremities. Good symmetric motor tone is noted throughout.  Sensory: Sensory testing is intact to soft touch on all 4 extremities. No evidence of extinction is noted.  Coordination: Cerebellar testing reveals good finger-nose-finger and heel-to-shin bilaterally.  Gait and station: Gait is normal.  Reflexes: Deep tendon reflexes are symmetric and normal bilaterally.   DIAGNOSTIC DATA (LABS, IMAGING, TESTING) - I reviewed patient records, labs, notes, testing and imaging myself where available.  Lab Results  Component Value Date   WBC 5.0 07/26/2017   HGB 11.9 (L) 07/26/2017   HCT 36.3 07/26/2017   MCV 99.7 07/26/2017   PLT  186 07/26/2017      Component Value Date/Time   NA 141 07/26/2017 0819   K 4.0 07/26/2017 0819   CL 110 07/26/2017 0819   CO2 25 07/26/2017 0819   GLUCOSE 107 (H) 07/26/2017 0819   BUN 17 07/26/2017 0819   CREATININE 0.87 07/26/2017 0819   CREATININE 1.03 03/12/2017 1213   CALCIUM 8.8 (L) 07/26/2017 0819   PROT 5.7 (L) 07/26/2017 0819   ALBUMIN 3.5 07/26/2017 0819   AST 71 (H) 07/26/2017 0819   ALT 153 (H) 07/26/2017 0819   ALKPHOS 127 (H) 07/26/2017 0819   BILITOT 0.6 07/26/2017 0819   GFRNONAA >60 07/26/2017 0819   GFRAA >60 07/26/2017 0819   Lab Results  Component Value Date   CHOL 151 05/02/2017   HDL 74 05/02/2017   LDLCALC 66 05/02/2017   TRIG 54 05/02/2017   CHOLHDL 2.0 05/02/2017   Lab Results  Component Value Date   HGBA1C 5.0 05/02/2017   Lab Results  Component Value Date   VITAMINB12 1,582 (H) 08/14/2016   Lab Results  Component Value Date   TSH 4.289 05/01/2017      ASSESSMENT AND PLAN 57 y.o. year old female  has a past medical history of ADD (attention deficit disorder), Arthritis, Cerebral infarction ([place]) (10/30/2011), Cerebrovascular disease (08/14/2016), Chronic back pain, Chronic low back pain (02/12/2409), Complication of anesthesia, Constipation, Degenerative disk disease, Degenerative joint disease, Depression, Diabetes mellitus without complication ([place]), DVT (deep venous thrombosis) ([place]), Family history of adverse reaction to anesthesia, Fibromyalgia, Generalized osteoarthritis of multiple sites (11/04/2013), GERD (gastroesophageal reflux disease), Heart palpitations (12/14/2013), History of blood clots, Hypoglycemia, Hypothyroid, Incomplete emptying of bladder, Insomnia, Iron deficiency anemia, Joint pain, Joint swelling, Memory disorder (08/14/2016), Morbid obesity ([place]), Nausea, Neck pain (05/25/2016), OSA on CPAP, Osteoarthritis, PFO (patent foramen ovale), Primary osteoarthritis of both feet (05/29/2016), Scoliosis, Sleep apnea, Stroke ([facility])  ("several"), Thrombophlebitis, Trochanteric bursitis of both hips (05/25/2016), Unilateral primary osteoarthritis, right knee (05/29/2016), Urinary urgency (04/19/2016), and Vein disorder (11/29/2010). here with:  1   Migraine headache  The patient will continue on Ajovy and Topamax. She has an acute headache today.  We called her GI specialist Dr. Corbin Ade and he was amenable she was giving Toradol 30 mg IM.  The patient is instructed that she should go home and lay down in a dark room with  little stimulation to improve her headache.  She is advised that if her headache frequency or severity worsen she should let us know.  She will follow-up in 6 months or sooner if needed.   I spent 15 minutes with the patient. 50% of this time was spent discussing acute migraine treatment.   Ward Givens, MSN, NP-C 08/26/2017, 8:32 AM [facility] [address] [place], [place] [address] [phone]

## 2017-08-26 NOTE — Patient Instructions (Addendum)
Your Plan:  Continue Ajovy and Topamax Toradol today If your symptoms worsen or you develop new symptoms please let us know.   Thank you for coming to see [place] at [facility]. I hope we have been able to provide you high quality care today.  You may receive a patient satisfaction survey over the next few weeks. We would appreciate your feedback and comments so that we may continue to improve ourselves and the health of our patients. Ketorolac injection What is this medicine? KETOROLAC (kee toe ROLE ak) is a non-steroidal anti-inflammatory drug (NSAID). It is used to treat moderate to severe pain for up to 5 days. It is commonly used after surgery. This medicine should not be used for more than 5 days. This medicine may be used for other purposes; ask your health care provider or pharmacist if you have questions. COMMON BRAND NAME(S): Toradol What should I tell my health care provider before I take this medicine? They need to know if you have any of these conditions: -asthma, especially aspirin-sensitive asthma -bleeding problems -kidney disease -stomach bleed, ulcer, or other problem -taking aspirin, other NSAID, or probenecid -an unusual or allergic reaction to ketorolac, tromethamine, aspirin, other NSAIDs, other medicines, foods, dyes or preservatives -pregnant or trying to get pregnant -breast-feeding How should I use this medicine? This medicine is for injection into a muscle or into a vein. It is given by a health care professional in a hospital or clinic setting. Talk to your pediatrician regarding the use of this medicine in children. Special care may be needed. Patients over 60 years old may have a stronger reaction and need a smaller dose. Overdosage: If you think you have taken too much of this medicine contact a poison control center or emergency room at once. NOTE: This medicine is only for you. Do not share this medicine with others. What if I miss a  dose? This does not apply. What may interact with this medicine? Do not take this medicine with any of the following medications: -aspirin and aspirin-like medicines -cidofovir -methotrexate -NSAIDs, medicines for pain and inflammation, like ibuprofen or naproxen -pentoxifylline -probenecid This medicine may also interact with the following medications: -alcohol -alendronate -alprazolam -carbamazepine -diuretics -flavocoxid -fluoxetine -ginkgo -lithium -medicines for blood pressure like enalapril -medicines that affect platelets like pentoxifylline -medicines that treat or prevent blood clots like heparin, warfarin -muscle relaxants -pemetrexed -phenytoin -thiothixene This list may not describe all possible interactions. Give your health care provider a list of all the medicines, herbs, non-prescription drugs, or dietary supplements you use. Also tell them if you smoke, drink alcohol, or use illegal drugs. Some items may interact with your medicine. What should I watch for while using this medicine? Tell your doctor or healthcare professional if your symptoms do not start to get better or if they get worse. This medicine does not prevent heart attack or stroke. In fact, this medicine may increase the chance of a heart attack or stroke. The chance may increase with longer use of this medicine and in people who have heart disease. If you take aspirin to prevent heart attack or stroke, talk with your doctor or health care professional. Do not take medicines such as ibuprofen and naproxen with this medicine. Side effects such as stomach upset, nausea, or ulcers may be more likely to occur. Many medicines available without a prescription should not be taken with this medicine. This medicine can cause ulcers and bleeding in the stomach and intestines at any time  during treatment. Do not smoke cigarettes or drink alcohol. These increase irritation to your stomach and can make it more  susceptible to damage from this medicine. Ulcers and bleeding can happen without warning symptoms and can cause death. This medicine can cause you to bleed more easily. Try to avoid damage to your teeth and gums when you brush or floss your teeth. What side effects may I notice from receiving this medicine? Side effects that you should report to your doctor or health care professional as soon as possible: -allergic reactions like skin rash, itching or hives, swelling of the face, lips, or tongue -breathing problems -high blood pressure -nausea, vomiting -redness, blistering, peeling or loosening of the skin, including inside the mouth -severe stomach pain -signs and symptoms of bleeding such as bloody or black, tarry stools; red or dark-brown urine; spitting up blood or brown material that looks like coffee grounds; red spots on the skin; unusual bruising or bleeding from the eye, gums, or nose -signs and symptoms of a blood clot changes in vision; chest pain; severe, sudden headache; trouble speaking; sudden numbness or weakness of the face, arm, or leg -trouble passing urine or change in the amount of urine -unexplained weight gain or swelling -unusually weak or tired -yellowing of eyes or skin Side effects that usually do not require medical attention (report to your doctor or health care professional if they continue or are bothersome): -diarrhea -dizziness -headache -heartburn This list may not describe all possible side effects. Call your doctor for medical advice about side effects. You may report side effects to FDA at 1-800-FDA-1088. Where should I keep my medicine? This drug is given in a hospital or clinic and will not be stored at home. NOTE: This sheet is a summary. It may not cover all possible information. If you have questions about this medicine, talk to your doctor, pharmacist, or health care provider.  2018 Elsevier/Gold Standard (2016-01-18 14:38:40)

## 2017-08-26 NOTE — Addendum Note (Signed)
Addended by: Brandon Melnick on: 08/26/2017 04:15 PM   Modules accepted: Orders

## 2017-08-27 ENCOUNTER — Telehealth: Payer: Self-pay | Admitting: Adult Health

## 2017-08-27 ENCOUNTER — Encounter: Payer: PPO | Admitting: Psychology

## 2017-08-27 LAB — HEPATIC FUNCTION PANEL
AG Ratio: 1.8 (calc) (ref 1.0–2.5)
ALT: 357 U/L — ABNORMAL HIGH (ref 6–29)
AST: 57 U/L — ABNORMAL HIGH (ref 10–35)
Albumin: 4 g/dL (ref 3.6–5.1)
Alkaline phosphatase (APISO): 159 U/L — ABNORMAL HIGH (ref 33–130)
Bilirubin, Direct: 0.1 mg/dL (ref 0.0–0.2)
Globulin: 2.2 g/dL (calc) (ref 1.9–3.7)
Indirect Bilirubin: 0.4 mg/dL (calc) (ref 0.2–1.2)
Total Bilirubin: 0.5 mg/dL (ref 0.2–1.2)
Total Protein: 6.2 g/dL (ref 6.1–8.1)

## 2017-08-27 NOTE — Telephone Encounter (Signed)
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  Start cerefolin.  (these labs are from 08/14/2016.  Pt wanted copy mailed.  Done.

## 2017-08-27 NOTE — Telephone Encounter (Signed)
When the patient was taken out yesterday after her visit she left a note with the checkout staff to give to me.  She is inquiring if her factor V was positive or negative.  Blood work confirmed that it was negative.  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 According to Dr. Tobey Grim telephone note he recommended Cerefolin.

## 2017-08-28 ENCOUNTER — Other Ambulatory Visit: Payer: Self-pay | Admitting: Neurology

## 2017-08-28 NOTE — Progress Notes (Signed)
Spoke with patient today, states her LD Xarelto was 08-27-16.

## 2017-08-29 ENCOUNTER — Encounter (HOSPITAL_BASED_OUTPATIENT_CLINIC_OR_DEPARTMENT_OTHER)
Admission: RE | Disposition: A | Discharge status: Home / Self Care | Payer: Self-pay | Source: Ambulatory Visit | Attending: Orthopedic Surgery

## 2017-08-29 ENCOUNTER — Ambulatory Visit (HOSPITAL_BASED_OUTPATIENT_CLINIC_OR_DEPARTMENT_OTHER)
Admission: RE | Admit: 2017-08-29 | Discharge: 2017-08-29 | Disposition: A | Discharge status: Home / Self Care | Payer: PPO | Source: Ambulatory Visit | Attending: Orthopedic Surgery | Admitting: Orthopedic Surgery

## 2017-08-29 ENCOUNTER — Ambulatory Visit (HOSPITAL_BASED_OUTPATIENT_CLINIC_OR_DEPARTMENT_OTHER): Payer: PPO | Admitting: Anesthesiology

## 2017-08-29 ENCOUNTER — Other Ambulatory Visit: Payer: Self-pay

## 2017-08-29 ENCOUNTER — Encounter (HOSPITAL_BASED_OUTPATIENT_CLINIC_OR_DEPARTMENT_OTHER): Payer: Self-pay | Admitting: *Deleted

## 2017-08-29 DIAGNOSIS — Z87891 Personal history of nicotine dependence: Secondary | ICD-10-CM | POA: Insufficient documentation

## 2017-08-29 DIAGNOSIS — F329 Major depressive disorder, single episode, unspecified: Secondary | ICD-10-CM | POA: Insufficient documentation

## 2017-08-29 DIAGNOSIS — Z86718 Personal history of other venous thrombosis and embolism: Secondary | ICD-10-CM | POA: Insufficient documentation

## 2017-08-29 DIAGNOSIS — M797 Fibromyalgia: Secondary | ICD-10-CM | POA: Diagnosis not present

## 2017-08-29 DIAGNOSIS — G4730 Sleep apnea, unspecified: Secondary | ICD-10-CM | POA: Insufficient documentation

## 2017-08-29 DIAGNOSIS — Z8673 Personal history of transient ischemic attack (TIA), and cerebral infarction without residual deficits: Secondary | ICD-10-CM | POA: Diagnosis not present

## 2017-08-29 DIAGNOSIS — M1811 Unilateral primary osteoarthritis of first carpometacarpal joint, right hand: Secondary | ICD-10-CM | POA: Diagnosis not present

## 2017-08-29 DIAGNOSIS — E039 Hypothyroidism, unspecified: Secondary | ICD-10-CM | POA: Diagnosis not present

## 2017-08-29 DIAGNOSIS — Z7901 Long term (current) use of anticoagulants: Secondary | ICD-10-CM | POA: Diagnosis not present

## 2017-08-29 DIAGNOSIS — K219 Gastro-esophageal reflux disease without esophagitis: Secondary | ICD-10-CM | POA: Diagnosis not present

## 2017-08-29 DIAGNOSIS — Z6835 Body mass index (BMI) 35.0-35.9, adult: Secondary | ICD-10-CM | POA: Diagnosis not present

## 2017-08-29 DIAGNOSIS — Z79899 Other long term (current) drug therapy: Secondary | ICD-10-CM | POA: Insufficient documentation

## 2017-08-29 HISTORY — PX: TENDON TRANSFER: SHX6109

## 2017-08-29 HISTORY — PX: BONE EXCISION: SHX6730

## 2017-08-29 HISTORY — PX: CARPOMETACARPEL SUSPENSION PLASTY: SHX5005

## 2017-08-29 SURGERY — CARPOMETACARPEL (CMC) SUSPENSION PLASTY
Anesthesia: Monitor Anesthesia Care | Site: Hand | Laterality: Right

## 2017-08-29 MED ORDER — LACTATED RINGERS IV SOLN
INTRAVENOUS | Status: DC
  Administered 2017-08-29: 11:00:00 via INTRAVENOUS

## 2017-08-29 MED ORDER — FENTANYL CITRATE (PF) 100 MCG/2ML IJ SOLN
INTRAMUSCULAR | Status: AC
  Filled 2017-08-29: qty 2

## 2017-08-29 MED ORDER — PROPOFOL 10 MG/ML IV BOLUS
INTRAVENOUS | Status: AC
  Filled 2017-08-29: qty 20

## 2017-08-29 MED ORDER — FENTANYL CITRATE (PF) 100 MCG/2ML IJ SOLN: 25.0000 ug | INTRAMUSCULAR | Status: DC | PRN

## 2017-08-29 MED ORDER — MIDAZOLAM HCL 2 MG/2ML IJ SOLN
INTRAMUSCULAR | Status: AC
  Filled 2017-08-29: qty 2

## 2017-08-29 MED ORDER — PROPOFOL 500 MG/50ML IV EMUL
INTRAVENOUS | Status: DC | PRN
  Administered 2017-08-29: 25 ug/kg/min via INTRAVENOUS

## 2017-08-29 MED ORDER — ROPIVACAINE HCL 7.5 MG/ML IJ SOLN
INTRAMUSCULAR | Status: DC | PRN
  Administered 2017-08-29: 20 mL via PERINEURAL

## 2017-08-29 MED ORDER — ONDANSETRON HCL 4 MG/2ML IJ SOLN
INTRAMUSCULAR | Status: DC | PRN
  Administered 2017-08-29: 4 mg via INTRAVENOUS

## 2017-08-29 MED ORDER — BUPIVACAINE-EPINEPHRINE (PF) 0.5% -1:200000 IJ SOLN
INTRAMUSCULAR | Status: DC | PRN
  Administered 2017-08-29: 20 mL via PERINEURAL

## 2017-08-29 MED ORDER — OXYCODONE HCL 5 MG/5ML PO SOLN: 5.0000 mg | Freq: Once | ORAL | Status: DC | PRN

## 2017-08-29 MED ORDER — LIDOCAINE 2% (20 MG/ML) 5 ML SYRINGE
INTRAMUSCULAR | Status: DC | PRN
Start: 2017-08-29 — End: 2017-08-29
  Administered 2017-08-29: 30 mg via INTRAVENOUS

## 2017-08-29 MED ORDER — OXYCODONE HCL 5 MG PO TABS: 5.0000 mg | ORAL_TABLET | Freq: Once | ORAL | Status: DC | PRN

## 2017-08-29 MED ORDER — ACETAMINOPHEN 325 MG PO TABS: 325.0000 mg | ORAL_TABLET | ORAL | Status: DC | PRN

## 2017-08-29 MED ORDER — OXYCODONE-ACETAMINOPHEN 7.5-325 MG PO TABS: 1.0000 | ORAL_TABLET | ORAL | 0 refills | Status: DC | PRN

## 2017-08-29 MED ORDER — CEFAZOLIN SODIUM-DEXTROSE 2-4 GM/100ML-% IV SOLN
INTRAVENOUS | Status: AC
  Filled 2017-08-29: qty 100

## 2017-08-29 MED ORDER — FENTANYL CITRATE (PF) 100 MCG/2ML IJ SOLN
50.0000 ug | INTRAMUSCULAR | Status: DC | PRN
  Administered 2017-08-29: 50 ug via INTRAVENOUS

## 2017-08-29 MED ORDER — KETOROLAC TROMETHAMINE 30 MG/ML IJ SOLN
INTRAMUSCULAR | Status: AC
  Filled 2017-08-29: qty 1

## 2017-08-29 MED ORDER — LIDOCAINE 2% (20 MG/ML) 5 ML SYRINGE
INTRAMUSCULAR | Status: AC
  Filled 2017-08-29: qty 5

## 2017-08-29 MED ORDER — MIDAZOLAM HCL 2 MG/2ML IJ SOLN
1.0000 mg | INTRAMUSCULAR | Status: DC | PRN
  Administered 2017-08-29: 2 mg via INTRAVENOUS

## 2017-08-29 MED ORDER — SCOPOLAMINE 1 MG/3DAYS TD PT72: 1.0000 | MEDICATED_PATCH | Freq: Once | TRANSDERMAL | Status: DC | PRN

## 2017-08-29 MED ORDER — LIDOCAINE HCL (CARDIAC) PF 100 MG/5ML IV SOSY: PREFILLED_SYRINGE | INTRAVENOUS | Status: DC | PRN

## 2017-08-29 MED ORDER — ONDANSETRON HCL 4 MG/2ML IJ SOLN
INTRAMUSCULAR | Status: AC
  Filled 2017-08-29: qty 2

## 2017-08-29 MED ORDER — CHLORHEXIDINE GLUCONATE 4 % EX LIQD: 60.0000 mL | Freq: Once | CUTANEOUS | Status: DC

## 2017-08-29 MED ORDER — ACETAMINOPHEN 160 MG/5ML PO SOLN: 325.0000 mg | ORAL | Status: DC | PRN

## 2017-08-29 MED ORDER — CEFAZOLIN SODIUM-DEXTROSE 2-4 GM/100ML-% IV SOLN
2.0000 g | INTRAVENOUS | Status: AC
  Administered 2017-08-29: 2 g via INTRAVENOUS

## 2017-08-29 SURGICAL SUPPLY — 82 items
BAG DECANTER FOR FLEXI CONT (MISCELLANEOUS) IMPLANT
BIT DRILL 7/64X5 DISP (BIT) ×2 IMPLANT
BLADE MINI RND TIP GREEN BEAV (BLADE) ×2 IMPLANT
BLADE SURG 15 STRL LF DISP TIS (BLADE) ×1 IMPLANT
BLADE SURG 15 STRL SS (BLADE) ×1
BNDG COHESIVE 3X5 TAN STRL LF (GAUZE/BANDAGES/DRESSINGS) ×2 IMPLANT
BNDG ESMARK 4X9 LF (GAUZE/BANDAGES/DRESSINGS) ×2 IMPLANT
BNDG GAUZE ELAST 4 BULKY (GAUZE/BANDAGES/DRESSINGS) ×2 IMPLANT
CHLORAPREP W/TINT 26ML (MISCELLANEOUS) ×2 IMPLANT
CORD BIPOLAR FORCEPS 12FT (ELECTRODE) ×2 IMPLANT
COTTONBALL LRG STERILE PKG (GAUZE/BANDAGES/DRESSINGS) IMPLANT
COVER BACK TABLE 60X90IN (DRAPES) ×2 IMPLANT
COVER MAYO STAND STRL (DRAPES) ×2 IMPLANT
CUFF TOURNIQUET SINGLE 18IN (TOURNIQUET CUFF) ×2 IMPLANT
DECANTER SPIKE VIAL GLASS SM (MISCELLANEOUS) IMPLANT
DRAIN TLS ROUND 10FR (DRAIN) IMPLANT
DRAPE EXTREMITY T 121X128X90 (DRAPE) ×2 IMPLANT
DRAPE OEC MINIVIEW 54X84 (DRAPES) ×2 IMPLANT
DRAPE SURG 17X23 STRL (DRAPES) ×2 IMPLANT
DRILL BIT 1/8DIAX5INL DISPOSE (BIT) ×2 IMPLANT
DRSG PAD ABDOMINAL 8X10 ST (GAUZE/BANDAGES/DRESSINGS) IMPLANT
GAUZE 4X4 16PLY RFD (DISPOSABLE) IMPLANT
GAUZE SPONGE 4X4 12PLY STRL (GAUZE/BANDAGES/DRESSINGS) ×2 IMPLANT
GAUZE XEROFORM 1X8 LF (GAUZE/BANDAGES/DRESSINGS) ×2 IMPLANT
GLOVE BIO SURGEON STRL SZ 6.5 (GLOVE) ×2 IMPLANT
GLOVE BIOGEL PI IND STRL 7.0 (GLOVE) ×1 IMPLANT
GLOVE BIOGEL PI IND STRL 8 (GLOVE) ×1 IMPLANT
GLOVE BIOGEL PI IND STRL 8.5 (GLOVE) ×1 IMPLANT
GLOVE BIOGEL PI INDICATOR 7.0 (GLOVE) ×1
GLOVE BIOGEL PI INDICATOR 8 (GLOVE) ×1
GLOVE BIOGEL PI INDICATOR 8.5 (GLOVE) ×1
GLOVE SS BIOGEL STRL SZ 7 (GLOVE) ×1 IMPLANT
GLOVE SUPERSENSE BIOGEL SZ 7 (GLOVE) ×1
GLOVE SURG ORTHO 8.0 STRL STRW (GLOVE) ×2 IMPLANT
GOWN STRL REUS W/ TWL LRG LVL3 (GOWN DISPOSABLE) ×3 IMPLANT
GOWN STRL REUS W/TWL LRG LVL3 (GOWN DISPOSABLE) ×3
GOWN STRL REUS W/TWL XL LVL3 (GOWN DISPOSABLE) ×2 IMPLANT
K-WIRE .035X4 (WIRE) IMPLANT
LOOP VESSEL MAXI BLUE (MISCELLANEOUS) IMPLANT
NEEDLE HYPO 22GX1.5 SAFETY (NEEDLE) IMPLANT
NEEDLE KEITH (NEEDLE) IMPLANT
NEEDLE PRECISIONGLIDE 27X1.5 (NEEDLE) IMPLANT
NS IRRIG 1000ML POUR BTL (IV SOLUTION) ×2 IMPLANT
PACK BASIN DAY SURGERY FS (CUSTOM PROCEDURE TRAY) ×2 IMPLANT
PAD CAST 3X4 CTTN HI CHSV (CAST SUPPLIES) ×1 IMPLANT
PADDING CAST ABS 3INX4YD NS (CAST SUPPLIES)
PADDING CAST ABS 4INX4YD NS (CAST SUPPLIES) ×1
PADDING CAST ABS COTTON 3X4 (CAST SUPPLIES) IMPLANT
PADDING CAST ABS COTTON 4X4 ST (CAST SUPPLIES) ×1 IMPLANT
PADDING CAST COTTON 3X4 STRL (CAST SUPPLIES) ×1
SLEEVE SCD COMPRESS KNEE MED (MISCELLANEOUS) ×2 IMPLANT
SLING ARM FOAM STRAP LRG (SOFTGOODS) ×2 IMPLANT
SPLINT PLASTER CAST XFAST 3X15 (CAST SUPPLIES) ×10 IMPLANT
SPLINT PLASTER XTRA FASTSET 3X (CAST SUPPLIES) ×10
STOCKINETTE 4X48 STRL (DRAPES) ×2 IMPLANT
SUT CHROMIC 5 0 P 3 (SUTURE) IMPLANT
SUT ETHIBOND 3-0 V-5 (SUTURE) ×4 IMPLANT
SUT ETHILON 4 0 PS 2 18 (SUTURE) ×2 IMPLANT
SUT FIBERWIRE 2-0 18 17.9 3/8 (SUTURE)
SUT FIBERWIRE 4-0 18 DIAM BLUE (SUTURE)
SUT FIBERWIRE 4-0 18 TAPR NDL (SUTURE)
SUT MERSILENE 2.0 SH NDLE (SUTURE) IMPLANT
SUT MERSILENE 4 0 P 3 (SUTURE) IMPLANT
SUT PROLENE 2 0 SH DA (SUTURE) IMPLANT
SUT SILK 2 0 PERMA HAND 18 BK (SUTURE) IMPLANT
SUT SILK 4 0 PS 2 (SUTURE) IMPLANT
SUT STEEL 3 0 (SUTURE) ×2 IMPLANT
SUT VIC AB 3-0 PS1 18 (SUTURE)
SUT VIC AB 3-0 PS1 18XBRD (SUTURE) IMPLANT
SUT VIC AB 4-0 P-3 18XBRD (SUTURE) ×1 IMPLANT
SUT VIC AB 4-0 P2 18 (SUTURE) IMPLANT
SUT VIC AB 4-0 P3 18 (SUTURE) ×1
SUT VICRYL 4-0 PS2 18IN ABS (SUTURE) IMPLANT
SUTURE FIBERWR 2-0 18 17.9 3/8 (SUTURE) IMPLANT
SUTURE FIBERWR 4-0 18 DIA BLUE (SUTURE) IMPLANT
SUTURE FIBERWR 4-0 18 TAPR NDL (SUTURE) IMPLANT
SYR BULB 3OZ (MISCELLANEOUS) ×2 IMPLANT
SYR CONTROL 10ML LL (SYRINGE) IMPLANT
TOWEL GREEN STERILE FF (TOWEL DISPOSABLE) ×4 IMPLANT
TOWEL OR NON WOVEN STRL DISP B (DISPOSABLE) ×2 IMPLANT
TUBE FEEDING ENTERAL 5FR 16IN (TUBING) IMPLANT
UNDERPAD 30X30 (UNDERPADS AND DIAPERS) ×2 IMPLANT

## 2017-08-29 NOTE — Progress Notes (Signed)
Assisted Dr. Moser with right, ultrasound guided, axillary block. Side rails up, monitors on throughout procedure. See vital signs in flow sheet. Tolerated Procedure well. 

## 2017-08-29 NOTE — Op Note (Signed)
NAME: Debbie Pineda MEDICAL RECORD NO: [id number] DATE OF BIRTH: [birth year] FACILITY: [facility] LOCATION: [facility] PHYSICIAN: Wynonia Sours, MD   OPERATIVE REPORT   DATE OF PROCEDURE: 08/29/17    PREOPERATIVE DIAGNOSIS:   CMC arthritis right thumb   POSTOPERATIVE DIAGNOSIS:   Same   PROCEDURE:   Suspension plasty with excision of the trapezium abductor pollicis longus tendon transfer right thumb   SURGEON: Daryll Brod, M.D.   ASSISTANT: none Leanora Cover, MD   ANESTHESIA:  Regional with sedation   INTRAVENOUS FLUIDS:  Per anesthesia flow sheet.   ESTIMATED BLOOD LOSS:  Minimal.   COMPLICATIONS:  None.   SPECIMENS:  none   TOURNIQUET TIME:   * Missing tourniquet times found for documented tourniquets in log: [id number] *   DISPOSITION:  Stable to PACU.   INDICATIONS: Vision is a 57 year old female with a history of pain at the basal joint of her thumb.  X-rays reveal significant degenerative changes.  This is not responded to conservative treatment.  She has elected undergo suspension plasty with excision of the trapezium APL transfer her right thumb.  Pre-peri-and postoperative course been discussed along with risk complications.  She is aware that there is no guarantee to the surgery the possibility of infection recurrence injury to arteries nerves tendons complete relief symptoms and dystrophy.  In the preoperative area the patient is seen extremity marked by both patient and surgeon antibiotic given  OPERATIVE COURSE: Patient is brought to the operating room after supraclavicular block was carried out without difficulty in the preoperative area under the direction the anesthesia department.  She was prepped and draped in supine position with the right arm free.  A ChloraPrep was used to prep a three-minute dry time allowed and timeout taken to confirm patient procedure.  The limb was exsanguinated with an Esmarch bandage turn placed in the upper arm was inflated  to 250 mmHg.  A hockey-stick incision was then made over the base of the right thumb metacarpal.  Carried along the first dorsal extensor compartment.  This carried down through subcutaneous tissue.  Bleeders were electrocauterized necessary bipolar.  The dorsal branches of the radial nerve were identified and protected.  The interval between the extensor pollicis brevis abductor pollicis longus was then incised.  The radial artery was identified at the base of the trapezium.  An incision was then made through the periosteum of the trapezium the carpometacarpal joint and the STT joint.  Periosteum was then sharply and bluntly dissected free from the trapezium.  Trapezium was then removed in pieces and that it would be was unable to be removed and one solid piece.  This was done with a small rondure.  Any osteophytes were removed from the interval between the first and second metacarpals.  The dorsal portion of the abductor pollicis longus attached to the bone was then selected this was isolated from the remaining part of the abductor pollicis longus tendon.  A separate incision was then made at the musculotendinous junction transversely.  Carried down through subcutaneous tissue.  The fascia was incised with a Chief Technology Officer was then used to bring a monofilament wire as a cheese cutter around the selected portion of the abductor pollicis longus and this was pulled into the proximal wound.  This allowed transection through the musculotendinous junction of the abductor pollicis longus dorsal portion.  This was delivered distally.  The wound care was then irrigated and closed with interrupted 4 nylon sutures.  Any muscle was removed from the abductor pollicis longus tendon repair.  A drill hole was then placed in the base of the thumb metacarpal and dorsal palmar direction of the radial to ulnar in position.  This was done with a 7 6 the force drill bit and enlarged to 1/8.  A second drill hole was then  placed in the base of the index metacarpal from a volar to dorsal radial to ulnar direction.  This was enlarged with the 1/8 inch drill bit.  The abductor pollicis longus tendon was then passed through the base of the thumb and a dorsal to palmar direction and then through the index metacarpal and a radial to ulnar direction.  A fine hemostat was then used to create a tunnel around the base of the metacarpal underneath the extensor tendon and the abductor pollicis longus was then delivered to the area of the removed trapezium.  The wound on the dorsal aspect in the intermetacarpal space which had been me made to allow transfer of the tendon using monofilament wires was irrigated and closed with interrupted 4-0 nylon sutures.  The abductor pollicis longus was then wrapped around the flexor carpi radialis and it itself and sutured into position with multiple 3 oh the band sutures.  This was done with the thumb and with traction.  Wound was irrigated x-rays AP lateral direction revealed the thumb metacarpal was well suspended from the index metacarpal.  The capsule was then closed with figure-of-eight 4-0 Vicryl sutures subcutaneous tissue with interrupted 4-0 Vicryl and skin with interrupted 4-0 nylon sutures a sterile compressive dressing dorsal palmar thumb spica splint was applied and deflation the tourniquet all fingers immediately pink.  She was taken to the recovery room for observation in satisfactory condition.  She will be discharged home to return [facility] in 1 week on Percocet.   Wynonia Sours, MD Electronically signed, 08/29/17

## 2017-08-29 NOTE — Discharge Instructions (Addendum)
[facility] Instructions °Hand Surgery ° °Wound Care: °Keep your hand elevated above the level of your heart.  Do not allow it to dangle by your side.  Keep the dressing dry and do not remove it unless your doctor advises you to do so.  He will usually change it at the time of your post-op visit.  Moving your fingers is advised to stimulate circulation but will depend on the site of your surgery.  If you have a splint applied, your doctor will advise you regarding movement. ° °Activity: °Do not drive or operate machinery today.  Rest today and then you may return to your normal activity and work as indicated by your physician. ° °Diet:  °Drink liquids today or eat a light diet.  You may resume a regular diet tomorrow.   ° °General expectations: °Pain for two to three days. °Fingers may become slightly swollen. ° °Call your doctor if any of the following occur: °Severe pain not relieved by pain medication. °Elevated temperature. °Dressing soaked with blood. °Inability to move fingers. °White or bluish color to fingers. ° ° ° °Post Anesthesia Home Care Instructions ° °Activity: °Get plenty of rest for the remainder of the day. A responsible individual must stay with you for 24 hours following the procedure.  °For the next 24 hours, DO NOT: °-Drive a car °-Operate machinery °-Drink alcoholic beverages °-Take any medication unless instructed by your physician °-Make any legal decisions or sign important papers. ° °Meals: °Start with liquid foods such as gelatin or soup. Progress to regular foods as tolerated. Avoid greasy, spicy, heavy foods. If nausea and/or vomiting occur, drink only clear liquids until the nausea and/or vomiting subsides. Call your physician if vomiting continues. ° °Special Instructions/Symptoms: °Your throat may feel dry or sore from the anesthesia or the breathing tube placed in your throat during surgery. If this causes discomfort, gargle with warm salt water. The discomfort should disappear  within 24 hours. ° °If you had a scopolamine patch placed behind your ear for the management of post- operative nausea and/or vomiting: ° °1. The medication in the patch is effective for 72 hours, after which it should be removed.  Wrap patch in a tissue and discard in the trash. Wash hands thoroughly with soap and water. °2. You may remove the patch earlier than 72 hours if you experience unpleasant side effects which may include dry mouth, dizziness or visual disturbances. °3. Avoid touching the patch. Wash your hands with soap and water after contact with the patch. °  °Call your surgeon if you experience:  ° °1.  Fever over 101.0. °2.  Inability to urinate. °3.  Nausea and/or vomiting. °4.  Extreme swelling or bruising at the surgical site. °5.  Continued bleeding from the incision. °6.  Increased pain, redness or drainage from the incision. °7.  Problems related to your pain medication. °8.  Any problems and/or concerns °

## 2017-08-29 NOTE — H&P (Signed)
Debbie Pineda is an 57 y.o. female.   Chief Complaint:  Pain right thumb HPI: Debbie Pineda is a 57 year old right-hand-dominant female with a history of arthritis of carpal metacarpal joint of her thumb.  She has had conservative treatment which has not resolved symptoms for her.  Is included splinting anti-inflammatories and injections.  Has been using Voltaren gel.  He has no history of injury.  She has no history of thyroid problems or gout.  She does a history of arthritis.  There is family history positive diabetes arthritis.  She has been tested negative for diabetes.  Past Medical History:  Diagnosis Date  . ADD (attention deficit disorder)    takes Adderall daily  . Arthritis    "all over"  . Cerebral infarction ([place]) 10/30/2011  . Cerebrovascular disease 08/14/2016  . Chronic back pain    "all over"  . Chronic low back pain 01/11/2016  . Complication of anesthesia    tends to have hypotension when NPO and post-anesthesia  . Constipation    takes stool softener daily  . Degenerative disk disease   . Degenerative joint disease   . Depression    takes Cymbalta daily for pain per pt  . Diabetes mellitus without complication ([place])   . DVT (deep venous thrombosis) ([facility])    RLE  . Family history of adverse reaction to anesthesia    a family member woke up during surgery; "think it was my mom"  . Fibromyalgia   . Generalized osteoarthritis of multiple sites 11/04/2013  . GERD (gastroesophageal reflux disease)   . Heart palpitations 12/14/2013  . History of blood clots    superficial  . Hypoglycemia   . Hypothyroid    takes Synthroid daily  . Incomplete emptying of bladder   . Insomnia    takes Trazodone nightly  . Iron deficiency anemia    takes Ferrous Sulfate daily  . Joint pain   . Joint swelling    knees and ankles  . Memory disorder 08/14/2016  . Morbid obesity ([place])   . Nausea    takes Zofran as needed.Seeing GI doc  . Neck pain 05/25/2016  . OSA on CPAP    tested  more than 5 yrs ago.    . Osteoarthritis   . PFO (patent foramen ovale)   . Primary osteoarthritis of both feet 05/29/2016   Right bunionectomy August 2017 by Dr. Sharol Given  . Scoliosis   . Sleep apnea   . Stroke [facility]) "several"   right foot weakness; memory issues, black spot right visual field since" (03/23/2015)  . Thrombophlebitis   . Trochanteric bursitis of both hips 05/25/2016  . Unilateral primary osteoarthritis, right knee 05/29/2016  . Urinary urgency 04/19/2016  . Vein disorder 11/29/2010    Past Surgical History:  Procedure Laterality Date  . BUNIONECTOMY Right 08/2015  . CARDIAC CATHETERIZATION     2008.  "it was fine" (not sure why she had it done, and doesn't know where)  . COLONOSCOPY N/A 03/25/2013   Procedure: COLONOSCOPY;  Surgeon: Rogene Houston, MD;  [facility];  Service: Endoscopy;  Laterality: N/A;  930  . ESOPHAGOGASTRODUODENOSCOPY    . EXPLORATORY LAPAROTOMY     "took fallopian tubes out"  . JOINT REPLACEMENT     bil knee   . KNEE ARTHROPLASTY    . KNEE ARTHROSCOPY Left   . KNEE ARTHROSCOPY W/ ACL RECONSTRUCTION Right    "added pins"  . LAPAROSCOPIC CHOLECYSTECTOMY  ~ 2001  .  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 SPINAL CORD STIMULATOR INSERTION N/A 04/18/2017   Procedure: LUMBAR SPINAL CORD STIMULATOR INSERTION;  Surgeon: Clydell Hakim, MD;  Location: [place];  Service: Neurosurgery;  Laterality: N/A;  LUMBAR SPINAL CORD STIMULATOR INSERTION  . TOTAL KNEE ARTHROPLASTY Left 03/23/2015   Procedure: TOTAL KNEE ARTHROPLASTY;  Surgeon: Newt Minion, MD;  Location: [place];  Service: Orthopedics;  Laterality: Left;  . TOTAL KNEE ARTHROPLASTY Right 08/15/2016   Procedure: RIGHT TOTAL KNEE ARTHROPLASTY, REMOVAL ACL SCREWS;  Surgeon: Newt Minion, MD;  Location: [place];  Service: Orthopedics;  Laterality: Right;  . TOTAL KNEE ARTHROPLASTY WITH HARDWARE REMOVAL Right   . VAGINAL HYSTERECTOMY    . VARICOSE VEIN SURGERY Right X 2    Family History  Problem  Relation Age of Onset  . Heart disease Father   . Cancer Father   . Cancer Mother        skin cancer   . Heart disease Brother   . Cancer Brother   . Diabetes Brother   . Stroke Brother   . Heart disease Sister   . Cancer Maternal Grandfather   . Hypothyroidism Daughter   . Hypertension Other   . Colon cancer Neg Hx    Social History:  reports that she quit smoking about 26 years ago. Her smoking use included cigarettes. She has a 6.00 pack-year smoking history. She has never used smokeless tobacco. She reports that she does not drink alcohol or use drugs.  Allergies:  Allergies  Allergen Reactions  . Lyrica [Pregabalin] Other (See Comments)    SOB, lower extremity edema and weight gain  . Flexeril [Cyclobenzaprine Hcl] Other (See Comments)    Numbness of extremities  . Morphine And Related Itching    Upper torso  . Sulfamethoxazole-Trimethoprim Itching and Rash    Bactrim  . Belsomra [Suvorexant] Other (See Comments)    unknown  . Tape Itching and Rash    Please use "paper" tape Rash if left on longer than 24 hrs    No medications prior to admission.    No results found for this or any previous visit (from the past 48 hour(s)).  No results found.   Pertinent items are noted in HPI.  Height 5\' 5"  (1.651 m), weight 95.3 kg.  General appearance: alert, cooperative and appears stated age Head: Normocephalic, without obvious abnormality Neck: no JVD Resp: clear to auscultation bilaterally Cardio: regular rate and rhythm, S1, S2 normal, no murmur, click, rub or gallop GI: soft, non-tender; bowel sounds normal; no masses,  no organomegaly Extremities: Pain right thumb Pulses: 2+ and symmetric Skin: Skin color, texture, turgor normal. No rashes or lesions Neurologic: Grossly normal Incision/Wound: na  Assessment/Plan Assessment:  1. Primary osteoarthritis of first carpometacarpal joint of right hand    Plan: She has requested proceeding to surgical  intervention. Pre-peri-and postoperative course are discussed along with risks and complications. She is aware there is no guarantee to the surgery the possibility of infection recurrence injury to arteries nerves tendons complete relief symptoms and dystrophy. She is scheduled for trapezial excision with APL transfer no neck suspension plasty right thumb as an outpatient under regional anesthesia. She will be in touch with her primary care physician for treatment of her Xarelto.    Akiva Brassfield R 08/29/2017, 9:38 AM

## 2017-08-29 NOTE — Op Note (Signed)
I assisted Surgeon(s) and Role:    Daryll Brod, MD - Primary on the Procedure(s): SUSPENSION PLASTY RIGHT THUMB right trapezium excision right abductor pollicis longus transfer on 08/29/2017.  I provided assistance on this case as follows: retraction soft tissues, harvest of tendon, passage tendon, closure of wounds.   Electronically signed by: Tennis Must, MD Date: 08/29/2017 Time: 1:00 PM

## 2017-08-29 NOTE — Brief Op Note (Signed)
08/29/2017  1:00 PM  PATIENT:  Debbie Pineda  57 y.o. female  PRE-OPERATIVE DIAGNOSIS:  CARPOMETACARPAL ARTHRITIS RIGHT THUMB  POST-OPERATIVE DIAGNOSIS:  CARPOMETACARPAL ARTHRITIS RIGHT THUMB  PROCEDURE:  Procedure(s): SUSPENSION PLASTY RIGHT THUMB (Right) right trapezium excision (Right) right abductor pollicis longus transfer (Right)  SURGEON:  Surgeon(s) and Role:    Daryll Brod, MD - Primary  PHYSICIAN ASSISTANT: K Karita Dralle,MD  ASSISTANTS: none   ANESTHESIA:   regional and IV sedation  EBL:  25 mL   BLOOD ADMINISTERED:none  DRAINS: none   LOCAL MEDICATIONS USED:  NONE  SPECIMEN:  No Specimen  DISPOSITION OF SPECIMEN:  N/A  COUNTS:  YES  TOURNIQUET:  * Missing tourniquet times found for documented tourniquets in log: [id number] *  DICTATION: .Dragon Dictation  PLAN OF CARE: Discharge to home after PACU  PATIENT DISPOSITION:  PACU - hemodynamically stable.

## 2017-08-29 NOTE — Anesthesia Procedure Notes (Signed)
Anesthesia Regional Block: Axillary brachial plexus block   Pre-Anesthetic Checklist: ,, timeout performed, Correct Patient, Correct Site, Correct Laterality, Correct Procedure, Correct Position, site marked, Risks and benefits discussed,  Surgical consent,  Pre-op evaluation,  At surgeon's request and post-op pain management  Laterality: Upper and Right  Prep: chloraprep       Needles:  Injection technique: Single-shot  Needle Type: Echogenic Stimulator Needle          Additional Needles:   Procedures:,,,, ultrasound used (permanent image in chart),,,,  Narrative:  Start time: 08/29/2017 11:20 AM End time: 08/29/2017 11:25 AM Injection made incrementally with aspirations every 5 mL.  Performed by: Personally  Anesthesiologist: Oleta Mouse, MD  Additional Notes: H+P and labs reviewed, risks and benefits discussed with patient, procedure tolerated well without complications

## 2017-08-29 NOTE — Transfer of Care (Signed)
Immediate Anesthesia Transfer of Care Note  Patient: Debbie Pineda  Procedure(s) Performed: SUSPENSION PLASTY RIGHT THUMB (Right Hand) right trapezium excision (Right Hand) right abductor pollicis longus transfer (Right Hand)  Patient Location: PACU  Anesthesia Type:Regional  Level of Consciousness: awake, alert  and oriented  Airway & Oxygen Therapy: Patient Spontanous Breathing  Post-op Assessment: Report given to RN and Post -op Vital signs reviewed and stable  Post vital signs: Reviewed and stable  Last Vitals:  Vitals Value Taken Time  BP    Temp    Pulse 54 08/29/2017  1:04 PM  Resp 14 08/29/2017  1:04 PM  SpO2 96 % 08/29/2017  1:04 PM  Vitals shown include unvalidated device data.  Last Pain:  Vitals:   08/29/17 1049  TempSrc: Oral  PainSc: 3          Complications: No apparent anesthesia complications

## 2017-08-29 NOTE — Anesthesia Preprocedure Evaluation (Signed)
Anesthesia Evaluation  Patient identified by MRN, date of birth, ID band Patient awake    Reviewed: Allergy & Precautions, NPO status , Patient's Chart, lab work & pertinent test results  History of Anesthesia Complications (+) history of anesthetic complications  Airway Mallampati: II  TM Distance: >3 FB Neck ROM: Full    Dental  (+) Teeth Intact   Pulmonary sleep apnea , former smoker,    breath sounds clear to auscultation       Cardiovascular  Rhythm:Regular  pfo   Neuro/Psych PSYCHIATRIC DISORDERS Depression TIA Neuromuscular disease CVA, Residual Symptoms    GI/Hepatic GERD  Medicated and Controlled,Previous gastric bypass   Endo/Other  diabetesHypothyroidism Morbid obesity  Renal/GU      Musculoskeletal  (+) Arthritis , Fibromyalgia -  Abdominal   Peds  Hematology  (+) anemia ,   Anesthesia Other Findings   Reproductive/Obstetrics                             Anesthesia Physical Anesthesia Plan  ASA: III  Anesthesia Plan: Regional and MAC   Post-op Pain Management:    Induction:   PONV Risk Score and Plan: 2 and Ondansetron and Treatment may vary due to age or medical condition  Airway Management Planned: Nasal Cannula  Additional Equipment: None  Intra-op Plan:   Post-operative Plan:   Informed Consent: I have reviewed the patients History and Physical, chart, labs and discussed the procedure including the risks, benefits and alternatives for the proposed anesthesia with the patient or authorized representative who has indicated his/her understanding and acceptance.   Dental advisory given  Plan Discussed with: CRNA and Surgeon  Anesthesia Plan Comments:         Anesthesia Quick Evaluation

## 2017-08-30 ENCOUNTER — Encounter (HOSPITAL_BASED_OUTPATIENT_CLINIC_OR_DEPARTMENT_OTHER): Payer: Self-pay | Admitting: Orthopedic Surgery

## 2017-08-30 ENCOUNTER — Other Ambulatory Visit (INDEPENDENT_AMBULATORY_CARE_PROVIDER_SITE_OTHER): Payer: Self-pay | Admitting: Internal Medicine

## 2017-08-30 NOTE — Anesthesia Postprocedure Evaluation (Signed)
Anesthesia Post Note  Patient: Debbie Pineda  Procedure(s) Performed: SUSPENSION PLASTY RIGHT THUMB (Right Hand) right trapezium excision (Right Hand) right abductor pollicis longus transfer (Right Hand)     Patient location during evaluation: PACU Anesthesia Type: Regional and MAC Level of consciousness: awake and alert Pain management: pain level controlled Vital Signs Assessment: post-procedure vital signs reviewed and stable Respiratory status: spontaneous breathing, nonlabored ventilation, respiratory function stable and patient connected to nasal cannula oxygen Cardiovascular status: stable and blood pressure returned to baseline Postop Assessment: no apparent nausea or vomiting Anesthetic complications: no    Last Vitals:  Vitals:   08/29/17 1430 08/29/17 1454  BP: 106/74 (!) 95/56  Pulse: (!) 53 (!) 50  Resp: 16 16  Temp:  36.6 C  SpO2: 100% 96%    Last Pain:  Vitals:   08/29/17 1454  TempSrc:   PainSc: 0-No pain                 Avory Rahimi

## 2017-08-30 NOTE — Addendum Note (Signed)
Addendum  created 08/30/17 1404 by Maryella Shivers, CRNA   Charge Capture section accepted

## 2017-09-03 ENCOUNTER — Ambulatory Visit (INDEPENDENT_AMBULATORY_CARE_PROVIDER_SITE_OTHER): Payer: PPO | Admitting: Psychology

## 2017-09-03 ENCOUNTER — Encounter: Payer: Self-pay | Admitting: Psychology

## 2017-09-03 DIAGNOSIS — I679 Cerebrovascular disease, unspecified: Secondary | ICD-10-CM

## 2017-09-03 DIAGNOSIS — R413 Other amnesia: Secondary | ICD-10-CM

## 2017-09-03 NOTE — Progress Notes (Signed)
**Note Debbie-Identified via Obfuscation** NEUROBEHAVIORAL STATUS EXAM   Name: Debbie Pineda Date of Birth: Mar 03, 1960 Date of Interview: 09/03/2017  Reason for Referral:  Debbie Pineda is a 57 y.o. female who is referred for neuropsychological evaluation by Dr. Shanon Brow Tat of [facility] due to concerns about cognitive changes. This patient is accompanied in the office by her daughter who supplements the history.  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 She kept working her way up to supervisor positions but states that "it got to be more than I could handle". She told her boss that she was going to see a doctor about the problems she was having. She reports she was then let go from her job. She was then evaluated by neurology [facility] Nani Skillern, PA). The patient and her daughter report that an MRI showed she had had a stroke in the past, and that in fact a prior MRI from 2007 had shown "three strokes" but the radiologist did not note this in the report. She reported she is upset by this MRI from 2007 being reported as negative when in fact it was not, because she "went through years of having problems" without an accurate diagnosis. She reported she is considering whether she wants to do anything about that.  Per imaging reports, I see she had a brain MRI 09/28/2005 for memory loss with progressive paid in four extremities and hand numbness. This was to rule out MS. The report states imaging did not demonstrate white matter disease or evidence of a demyelinating process. No acute intracranial findings were reported.  In  06/2007 she had another brain MRI for memory loss and headache with progressing symptoms over the last few months. This report noted three small chronic lacunar infarcts of the bilateral PICA cerebellar territories which are unchanged since the prior exam. The report stated stable, unremarkable MRI appearance of the brain for age aside from several chronic bilateral PICA territory infarcts.  In 10/2011, the patient experienced pain behind her left eye, difficulty with speech and incomplete right hemianopsia. She was taken to the ED and diagnosed to have clinical stroke. She was not a candidate for TPA since she was taking warfarin. MRI of the brain confirmed an acute infarct in the left posterior cerebral artery territory involving the posterior medial temporal lobe and medial occipital lobe. MRA of the brain confirmed an occluded left posterior cerebral artery compatible with acute infarct. She was seen by physical therapy and occupational therapy and no specific follow up was recommended. She continued to follow up with Nani Skillern, NP, at the [facility]. When that office closed, she transferred neurology care to Dr. Jannifer Franklin at [facility] and continues to see him. She is prescribed Aricept.   On 05/01/2017, Debbie Pineda presented to the ED reporting that she developed word finding difficulties earlier in the day accompanied by abnormal eye movements while she was driving home from an appointment. She denied any focal extremity weakness, headache, syncope, bladder or bowel incontinence. She denied any new medications but stated she had a spinal stimulator placed on 04/18/2017 by Dr. Jerry Caras. She denied any overuse of prescribed opioids. She  reported that her word finding difficulties improved throughout her time in the ED.  CTA of head and neck reportedly did not show any emergent large vessel occlusion or acute infarct or penumbra. There was stable encephalomalacia of the inferior occipital and temporal lobe  compatible with remote left PCA infarct. Neurology recommended admission for further workup.   Hospital admission records note that, after review of medical record, there had been a number of inconsistencies with the patient's claimed medical history. Hospital admission records also note that there appeared to be a functional component to the patient's word finding difficulty as her speech was intermittently completely normal but then became more pronounced when discussing any neurologic issues. 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 Topamax was increased. Speech therapy evaluation apparently recommended neurocognitive testing. It was noted by Dr. Carles Collet that the patient is on numerous medications that have psychoactive effects and anticholinergic effects which may affect her cognitive abilities and speech. The patient was discharged home on 05/03/2017.   At today's visit (09/03/2017), the patient reports she thinks her cognitive difficulties have been getting worse over time. She notes that most of her medical providers "think it is related to polypharmacy" and want to "get me off pain meds". She reports that she has been on oxycodone for chronic pain for a long time. She reports that her PCP wants her to come off of it through an inpatient rehabilitation program, but she "doesn't want to be treated like that, like a drug addict". She reports she has been reducing the amount she takes. At one point she was taking oxycodone 6 times a day. She reports her PCP would like her to be down to 2 a day now but she has to have 3 a day. She also reports that she has "ADD medicine" and takes Ritalin 10 mg. She is not sure if she has had ADD lifelong or if the medication was prescribed for stroke related attention difficulties. However, she finds it  helpful in keeping her "more awake" with the pain medications. The patient reports that after she was hospitalized for "seizure" in 04/2017 she had home speech therapy which helped her with word finding and memory. She does not think she has had a neurocognitive evaluation in the past.   Her daughter notes that there was a two week period about 4-5 years ago when the patient was on prednisone and was not having to take oxycodone at all, and despite having some oxycodone withdrawal, her "mental clarity was phenomenal".   Current cognitive symptoms, per the patient and her daughter, include: forgetfulness for recent conversations/events, misplacing/losing items, difficulty concentrating, distracted easily, word-finding difficulty, comprehension difficulty (misunderstanding what people said/meant), and uncertainty about directions/routes when driving. She is using GPS more.   She has not worked since 2009. She is on disability. She independently manages instrumental ADLs including driving, medications, finances/bills, appointments, and cooking. She denies any difficulty or errors in performing these tasks.  She has a family history of Alzheimer's disease in one aunt. Her father had brain cancer and Parkinson's disease.  Psychiatric history is significant for depression. This was present prior to onset of cognitive difficulties. She was treated with Prozac in the past. She had suicidal ideation and intention in the past. She has not had that in recent years. She denies history of alcohol or recreational drug abuse or  dependence. She denies misuse of prescription pain medication.   She reports good mood and her daughter agrees. She reports she no longer has depression. Her daughter states she acts like she has a lot to live for. Her pain and chronic illness does make her sad, though.   She denied history of head injury or concussion.  She reported chronic back pain, hand pain (recent surgery), and  migraines. She has sleep difficulty and takes medication for this. She uses her CPAP nightly. She denies any change in appetite or weight.    Social History: Born/Raised: [place] Education: [employer]  Occupational history: She was an Therapist, sports. On disability since 2010. Marital history: Married [phone] with 1 child (daughter), 2 grandchildren Alcohol: None Tobacco: Former (quit around 7 years ago) SA: Denies   Medical History: Past Medical History:  Diagnosis Date  . ADD (attention deficit disorder)    takes Adderall daily  . Arthritis    "all over"  . Cerebral infarction ([place]) 10/30/2011  . Cerebrovascular disease 08/14/2016  . Chronic back pain    "all over"  . Chronic low back pain 01/11/2016  . Complication of anesthesia    tends to have hypotension when NPO and post-anesthesia  . Constipation    takes stool softener daily  . Degenerative disk disease   . Degenerative joint disease   . Depression    takes Cymbalta daily for pain per pt  . Diabetes mellitus without complication ([place])   . DVT (deep venous thrombosis) ([facility])    RLE  . Family history of adverse reaction to anesthesia    a family member woke up during surgery; "think it was my mom"  . Fibromyalgia   . Generalized osteoarthritis of multiple sites 11/04/2013  . GERD (gastroesophageal reflux disease)   . Heart palpitations 12/14/2013  . History of blood clots    superficial  . Hypoglycemia   . Hypothyroid    takes Synthroid daily  . Incomplete emptying of bladder   . Insomnia    takes Trazodone nightly  . Iron deficiency anemia    takes Ferrous Sulfate daily  . Joint pain   . Joint swelling    knees and ankles  . Memory disorder 08/14/2016  . Morbid obesity ([place])   . Nausea    takes Zofran as needed.Seeing GI doc  . Neck pain 05/25/2016  . OSA on CPAP    tested more than 5 yrs ago.    . Osteoarthritis   . PFO (patent foramen ovale)   . Primary osteoarthritis of both feet 05/29/2016   Right bunionectomy  August 2017 by Dr. Sharol Given  . Scoliosis   . Sleep apnea   . Stroke [facility]) "several"   right foot weakness; memory issues, black spot right visual field since" (03/23/2015)  . Thrombophlebitis   . Trochanteric bursitis of both hips 05/25/2016  . Unilateral primary osteoarthritis, right knee 05/29/2016  . Urinary urgency 04/19/2016  . Vein disorder 11/29/2010     Current Medications:  Outpatient Encounter Medications as of 09/03/2017  Medication Sig  . baclofen (LIORESAL) 10 MG tablet Take 10 mg by mouth 3 (three) times daily as needed.  . Continuous Blood Gluc Sensor (FREESTYLE LIBRE 14 DAY SENSOR) MISC   . Cyanocobalamin (B-12) 5000 MCG SUBL Place 5,000 mcg under the tongue daily at 12 noon.  . diclofenac sodium (VOLTAREN) 1 % GEL Apply 4 g topically 4 (four) times daily as needed (PAIN).   Marland Kitchen donepezil (ARICEPT) 10 MG tablet  Take 1 tablet (10 mg total) by mouth at bedtime.  . DULoxetine (CYMBALTA) 60 MG capsule Take 60 mg by mouth 2 (two) times daily.  . folic acid (FOLVITE) 884 MCG tablet Take 400 mcg by mouth daily.  . Fremanezumab-vfrm (AJOVY) 225 MG/1.5ML SOSY Inject 225 mg into the skin every 30 (thirty) days.  Marland Kitchen gabapentin (NEURONTIN) 600 MG tablet Take 600 mg by mouth 3 (three) times daily.  Marland Kitchen KRILL OIL PO Take by mouth.  Marland Kitchen L-Methylfolate-B12-B6-B2 (CEREFOLIN PO) Take by mouth.  . levothyroxine (SYNTHROID, LEVOTHROID) 125 MCG tablet Take 125 mcg by mouth daily before breakfast.  . methylphenidate (RITALIN) 20 MG tablet Take 10 mg by mouth 5 (five) times daily. Between 0800 and 1600. Takes 1/2 tablet up to 5 times daily  . mirabegron ER (MYRBETRIQ) 50 MG TB24 tablet Take 50 mg by mouth daily.  . Multiple Minerals-Vitamins (CAL-MAG-ZINC-D PO) Take 1-2 tablets by mouth See admin instructions. Take 2 tablet at 1200 and 1 tablet at 1600  Calcium 1000 mg Vit D 600 iu Magnesium 400 mg Zinc 15 mg  . Multiple Vitamins-Minerals (ONE-A-DAY WOMENS PETITES) TABS Take 1 tablet by mouth 2 (two) times  daily.  . mupirocin ointment (BACTROBAN) 2 % Place 1 application into the nose as needed.  Marland Kitchen NARCAN 4 MG/0.1ML LIQD nasal spray kit Place 1 spray into the nose as needed (over dose).   Marland Kitchen omeprazole (PRILOSEC) 40 MG capsule Take 1 capsule by mouth twice daily.  . ondansetron (ZOFRAN-ODT) 4 MG disintegrating tablet Take 4 mg by mouth every 6 (six) hours as needed for nausea.   . Oxycodone HCl 10 MG TABS Take 1 tablet (10 mg total) by mouth every 4 (four) hours as needed. (Patient taking differently: Take 10 mg by mouth 3 (three) times daily as needed. )  . oxyCODONE-acetaminophen (PERCOCET) 7.5-325 MG tablet Take 1 tablet by mouth every 4 (four) hours as needed for severe pain.  . Polyvinyl Alcohol-Povidone (REFRESH OP) Place 1-2 drops into both eyes 3 (three) times daily as needed (dry eyes).  . rivaroxaban (XARELTO) 20 MG TABS tablet Take 1 tablet (20 mg total) by mouth daily with supper. (Patient taking differently: Take 20 mg by mouth at bedtime. )  . topiramate (TOPAMAX) 100 MG tablet Take 100 mg by mouth 2 (two) times daily.  . traZODone (DESYREL) 100 MG tablet Take 100-200 mg by mouth at bedtime as needed for sleep (depends on insomnia).   Marland Kitchen UNABLE TO FIND Apply 1 application topically 2 (two) times daily as needed (pain). Med Name: C-Anti-Inflammatory Cream  Diclofenac 3% / Baclofen 2% / Cyclobenzaprine 2% / Lidocaine 2%  . Vitamin D, Ergocalciferol, (DRISDOL) 50000 UNITS CAPS Take 50,000 Units by mouth every 7 (seven) days. Mondays   No facility-administered encounter medications on file as of 09/03/2017.      Behavioral Observations:   Appearance: Casually and appropriately dressed and groomed Gait: Ambulated independently, no gross abnormalities observed Speech: Fluent; normal rate, rhythm and volume. Rare word finding difficulty. Patient reports she is surprised she didn't have more difficulty talking throughout our interview. Thought process: Circumstantial (gets off topic but then  returns to topic eventually) Affect: Full, generally euthymic Interpersonal: Pleasant, appropriate   60 minutes spent face-to-face with patient completing neurobehavioral status exam. 60 minutes spent integrating medical records/clinical data and completing this report. [id number] unit; [id number] unit.   TESTING: There is medical necessity to proceed with neuropsychological assessment as the results will be used to aid in differential diagnosis  and clinical decision-making and to inform specific treatment recommendations. Per the patient, her daughter and medical records reviewed, there has been a change in cognitive functioning and a reasonable suspicion of neurocognitive disorder.  Clinical Decision Making: In considering the patient's current level of functioning, level of presumed impairment, nature of symptoms, emotional and behavioral responses during the interview, level of literacy, and observed level of motivation, a battery of tests was selected and communicated to the psychometrician.    PLAN: The patient will return to complete the above referenced full battery of neuropsychological testing with a psychometrician under my supervision. Education regarding testing procedures was provided to the patient. Subsequently, the patient will see this provider for a follow-up session at which time her test performances and my impressions and treatment recommendations will be reviewed in detail.  Evaluation ongoing; full report to follow.

## 2017-09-04 DIAGNOSIS — M79644 Pain in right finger(s): Secondary | ICD-10-CM | POA: Diagnosis not present

## 2017-09-04 DIAGNOSIS — M1811 Unilateral primary osteoarthritis of first carpometacarpal joint, right hand: Secondary | ICD-10-CM | POA: Diagnosis not present

## 2017-09-10 ENCOUNTER — Encounter (INDEPENDENT_AMBULATORY_CARE_PROVIDER_SITE_OTHER): Payer: Self-pay | Admitting: Internal Medicine

## 2017-09-10 ENCOUNTER — Ambulatory Visit (INDEPENDENT_AMBULATORY_CARE_PROVIDER_SITE_OTHER): Payer: PPO | Admitting: Internal Medicine

## 2017-09-10 VITALS — BP 112/76 | HR 88 | Temp 98.0°F | Ht 65.0 in | Wt 211.7 lb

## 2017-09-10 DIAGNOSIS — R748 Abnormal levels of other serum enzymes: Secondary | ICD-10-CM | POA: Diagnosis not present

## 2017-09-10 NOTE — Addendum Note (Signed)
Addended by: Butch Penny on: 09/10/2017 04:52 PM   Modules accepted: Orders

## 2017-09-10 NOTE — Patient Instructions (Signed)
Labs. OV in 6 months.  

## 2017-09-10 NOTE — Progress Notes (Signed)
Subjective:    Patient ID: Debbie Pineda, female    DOB: 03/14/1960, 57 y.o.   MRN: [id number]  HPI Here today for f/u. Last seen in February of this year for elevated liver enzymes. Admitted to [facility] in April of this year when her driving became erratic and admitted with acute confusional spell associated with some finding difficulties and headache.  Differential diagnosis includes left hemispheric ischemic strove, complex partial seizures and migraine with aura. Marland Kitchen He liver enzymes were elevated. She is not taking Tylenol.  She is doing okay. C/o back pain.  Recently had thumb surgery. Her appetite is okay. BMs are okay. No abdominal pain.  No GI complaints.    Hx of DVT and maintained on Xarelto  Hepatic Function Latest Ref Rng & Units 08/26/2017 07/26/2017 06/27/2017  Total Protein 6.1 - 8.1 g/dL 6.2 5.7(L) 6.3  Albumin 3.5 - 5.0 g/dL - 3.5 -  AST 10 - 35 U/L 57(H) 71(H) 29  ALT 6 - 29 U/L 357(H) 153(H) 80(H)  Alk Phosphatase 38 - 126 U/L - 127(H) -  Total Bilirubin 0.2 - 1.2 mg/dL 0.5 0.6 0.4  Bilirubin, Direct 0.0 - 0.2 mg/dL 0.1 - 0.1    Review of Systems  Past Medical History:  Diagnosis Date  . ADD (attention deficit disorder)    takes Adderall daily  . Arthritis    "all over"  . Cerebral infarction ([place]) 10/30/2011  . Cerebrovascular disease 08/14/2016  . Chronic back pain    "all over"  . Chronic low back pain 01/11/2016  . Complication of anesthesia    tends to have hypotension when NPO and post-anesthesia  . Constipation    takes stool softener daily  . Degenerative disk disease   . Degenerative joint disease   . Depression    takes Cymbalta daily for pain per pt  . Diabetes mellitus without complication ([place])   . DVT (deep venous thrombosis) (H[facility])    RLE  . Family history of adverse reaction to anesthesia    a family member woke up during surgery; "think it was my mom"  . Fibromyalgia   . Generalized osteoarthritis of multiple sites 11/04/2013  . GERD  (gastroesophageal reflux disease)   . Heart palpitations 12/14/2013  . History of blood clots    superficial  . Hypoglycemia   . Hypothyroid    takes Synthroid daily  . Incomplete emptying of bladder   . Insomnia    takes Trazodone nightly  . Iron deficiency anemia    takes Ferrous Sulfate daily  . Joint pain   . Joint swelling    knees and ankles  . Memory disorder 08/14/2016  . Morbid obesity ([place])   . Nausea    takes Zofran as needed.Seeing GI doc  . Neck pain 05/25/2016  . OSA on CPAP    tested more than 5 yrs ago.    . Osteoarthritis   . PFO (patent foramen ovale)   . Primary osteoarthritis of both feet 05/29/2016   Right bunionectomy August 2017 by Dr. Sharol Given  . Scoliosis   . Sleep apnea   . Stroke [facility]) "several"   right foot weakness; memory issues, black spot right visual field since" (03/23/2015)  . Thrombophlebitis   . Trochanteric bursitis of both hips 05/25/2016  . Unilateral primary osteoarthritis, right knee 05/29/2016  . Urinary urgency 04/19/2016  . Vein disorder 11/29/2010    Past Surgical History:  Procedure Laterality Date  . BONE EXCISION Right 08/29/2017  Procedure: right trapezium excision;  Surgeon: Daryll Brod, MD;  Location: [place];  Service: Orthopedics;  Laterality: Right;  . BUNIONECTOMY Right 08/2015  . CARDIAC CATHETERIZATION     2008.  "it was fine" (not sure why she had it done, and doesn't know where)  . CARPOMETACARPEL SUSPENSION PLASTY Right 08/29/2017   Procedure: SUSPENSION PLASTY RIGHT THUMB;  Surgeon: Daryll Brod, MD;  Location: [place];  Service: Orthopedics;  Laterality: Right;  . COLONOSCOPY N/A 03/25/2013   Procedure: COLONOSCOPY;  Surgeon: Rogene Houston, MD;  [facility];  Service: Endoscopy;  Laterality: N/A;  930  . ESOPHAGOGASTRODUODENOSCOPY    . EXPLORATORY LAPAROTOMY     "took fallopian tubes out"  . JOINT REPLACEMENT     bil knee   . KNEE ARTHROPLASTY    . KNEE ARTHROSCOPY Left   .  KNEE ARTHROSCOPY W/ ACL RECONSTRUCTION Right    "added pins"  . LAPAROSCOPIC CHOLECYSTECTOMY  ~ 2001  . 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 SPINAL CORD STIMULATOR INSERTION N/A 04/18/2017   Procedure: LUMBAR SPINAL CORD STIMULATOR INSERTION;  Surgeon: Clydell Hakim, MD;  Location: [place];  Service: Neurosurgery;  Laterality: N/A;  LUMBAR SPINAL CORD STIMULATOR INSERTION  . TENDON TRANSFER Right 08/29/2017   Procedure: right abductor pollicis longus transfer;  Surgeon: Daryll Brod, MD;  Location: [place];  Service: Orthopedics;  Laterality: Right;  . TOTAL KNEE ARTHROPLASTY Left 03/23/2015   Procedure: TOTAL KNEE ARTHROPLASTY;  Surgeon: Newt Minion, MD;  Location: [place];  Service: Orthopedics;  Laterality: Left;  . TOTAL KNEE ARTHROPLASTY Right 08/15/2016   Procedure: RIGHT TOTAL KNEE ARTHROPLASTY, REMOVAL ACL SCREWS;  Surgeon: Newt Minion, MD;  Location: [place];  Service: Orthopedics;  Laterality: Right;  . TOTAL KNEE ARTHROPLASTY WITH HARDWARE REMOVAL Right   . VAGINAL HYSTERECTOMY    . VARICOSE VEIN SURGERY Right X 2    Allergies  Allergen Reactions  . Lyrica [Pregabalin] Other (See Comments)    SOB, lower extremity edema and weight gain  . Flexeril [Cyclobenzaprine Hcl] Other (See Comments)    Numbness of extremities  . Morphine And Related Itching    Upper torso  . Sulfamethoxazole-Trimethoprim Itching and Rash    Bactrim  . Belsomra [Suvorexant] Other (See Comments)    unknown  . Tape Itching and Rash    Please use "paper" tape Rash if left on longer than 24 hrs    Current Outpatient Medications on File Prior to Visit  Medication Sig Dispense Refill  . amoxicillin (AMOXIL) 500 MG capsule Take 500 mg by mouth. As needed for dental appt. 4caps    . baclofen (LIORESAL) 10 MG tablet Take 10 mg by mouth 3 (three) times daily.   1  . Continuous Blood Gluc Sensor (FREESTYLE LIBRE 14 DAY SENSOR) MISC     . Cyanocobalamin (B-12) 5000 MCG SUBL Place 5,000 mcg  under the tongue daily at 12 noon.    . diclofenac sodium (VOLTAREN) 1 % GEL Apply 4 g topically 4 (four) times daily as needed (PAIN).     Marland Kitchen donepezil (ARICEPT) 10 MG tablet Take 1 tablet (10 mg total) by mouth at bedtime. 30 tablet 3  . DULoxetine (CYMBALTA) 60 MG capsule Take 60 mg by mouth 2 (two) times daily.  11  . folic acid (FOLVITE) 751 MCG tablet Take 400 mcg by mouth daily.    . Fremanezumab-vfrm (AJOVY) 225 MG/1.5ML SOSY Inject 225 mg into the  skin every 30 (thirty) days. 1.5 mL 5  . gabapentin (NEURONTIN) 600 MG tablet Take 800 mg by mouth 3 (three) times daily.     Marland Kitchen KRILL OIL PO Take by mouth.    Marland Kitchen L-Methylfolate-B12-B6-B2 (CEREFOLIN PO) Take by mouth.    . levothyroxine (SYNTHROID, LEVOTHROID) 125 MCG tablet Take 125 mcg by mouth daily before breakfast.    . methylphenidate (RITALIN) 20 MG tablet Take 10 mg by mouth 3 (three) times daily with meals. Between 0800 and 1600. Takes 1/2 tablet up to 5 times daily    . mirabegron ER (MYRBETRIQ) 50 MG TB24 tablet Take 50 mg by mouth daily.    . Multiple Minerals-Vitamins (CAL-MAG-ZINC-D PO) Take 1-2 tablets by mouth See admin instructions. Take 2 tablet at 1200 and 1 tablet at 1600  Calcium 1000 mg Vit D 600 iu Magnesium 400 mg Zinc 15 mg    . Multiple Vitamins-Minerals (ONE-A-DAY WOMENS PETITES) TABS Take 1 tablet by mouth 2 (two) times daily.    . mupirocin ointment (BACTROBAN) 2 % Place 1 application into the nose as needed.    Marland Kitchen NARCAN 4 MG/0.1ML LIQD nasal spray kit Place 1 spray into the nose as needed (over dose).     Marland Kitchen omeprazole (PRILOSEC) 40 MG capsule Take 1 capsule by mouth twice daily. 60 capsule 3  . ondansetron (ZOFRAN-ODT) 4 MG disintegrating tablet Take 4 mg by mouth every 6 (six) hours as needed for nausea.   2  . Oxycodone HCl 10 MG TABS Take 1 tablet (10 mg total) by mouth every 4 (four) hours as needed. (Patient taking differently: Take 10 mg by mouth 3 (three) times daily as needed. ) 30 tablet 0  . Polyvinyl  Alcohol-Povidone (REFRESH OP) Place 1-2 drops into both eyes 3 (three) times daily as needed (dry eyes).    . rivaroxaban (XARELTO) 20 MG TABS tablet Take 1 tablet (20 mg total) by mouth daily with supper. (Patient taking differently: Take 20 mg by mouth at bedtime. ) 30 tablet 1  . topiramate (TOPAMAX) 100 MG tablet Take 100 mg by mouth 2 (two) times daily.    . traZODone (DESYREL) 100 MG tablet Take 100-200 mg by mouth at bedtime as needed for sleep (depends on insomnia).     . Vitamin D, Ergocalciferol, (DRISDOL) 50000 UNITS CAPS Take 50,000 Units by mouth every 7 (seven) days. Mondays    . UNABLE TO FIND Apply 1 application topically 2 (two) times daily as needed (pain). Med Name: C-Anti-Inflammatory Cream  Diclofenac 3% / Baclofen 2% / Cyclobenzaprine 2% / Lidocaine 2%     No current facility-administered medications on file prior to visit.         Objective:   Physical Exam Blood pressure 112/76, pulse 88, temperature 98 F (36.7 C), height _0  (1.651 m), weight 211 lb 11.2 oz (96 kg). Alert and oriented. Skin warm and dry. Oral mucosa is moist.   . Sclera anicteric, conjunctivae is pink. Thyroid not enlarged. No cervical lymphadenopathy. Lungs clear. Heart regular rate and rhythm.  Abdomen is soft. Bowel sounds are positive. No hepatomegaly. No abdominal masses felt. No tenderness.  No edema to lower extremities.          Assessment & Plan:  Elevated liver enzymes. Am going to get a sedrate, ANA, SMA, alpha 1, ceruloplasmin, Hepatic. OV in 6 months.

## 2017-09-12 LAB — HEPATIC FUNCTION PANEL
AG Ratio: 1.5 (calc) (ref 1.0–2.5)
ALT: 33 U/L — ABNORMAL HIGH (ref 6–29)
AST: 24 U/L (ref 10–35)
Albumin: 3.8 g/dL (ref 3.6–5.1)
Alkaline phosphatase (APISO): 105 U/L (ref 33–130)
Bilirubin, Direct: 0.1 mg/dL (ref 0.0–0.2)
Globulin: 2.5 g/dL (calc) (ref 1.9–3.7)
Indirect Bilirubin: 0.2 mg/dL (calc) (ref 0.2–1.2)
Total Bilirubin: 0.3 mg/dL (ref 0.2–1.2)
Total Protein: 6.3 g/dL (ref 6.1–8.1)

## 2017-09-12 LAB — ALPHA-1-ANTITRYPSIN: A-1 Antitrypsin, Ser: 114 mg/dL (ref 83–199)

## 2017-09-12 LAB — ANA: Anti Nuclear Antibody(ANA): NEGATIVE

## 2017-09-12 LAB — SEDIMENTATION RATE: Sed Rate: 9 mm/h (ref 0–30)

## 2017-09-12 LAB — CERULOPLASMIN: Ceruloplasmin: 37 mg/dL (ref 18–53)

## 2017-09-13 DIAGNOSIS — Z6835 Body mass index (BMI) 35.0-35.9, adult: Secondary | ICD-10-CM | POA: Diagnosis not present

## 2017-09-13 DIAGNOSIS — Z9884 Bariatric surgery status: Secondary | ICD-10-CM | POA: Diagnosis not present

## 2017-09-13 DIAGNOSIS — M797 Fibromyalgia: Secondary | ICD-10-CM | POA: Diagnosis not present

## 2017-09-13 DIAGNOSIS — I10 Essential (primary) hypertension: Secondary | ICD-10-CM | POA: Diagnosis not present

## 2017-09-13 DIAGNOSIS — E063 Autoimmune thyroiditis: Secondary | ICD-10-CM | POA: Diagnosis not present

## 2017-09-16 ENCOUNTER — Ambulatory Visit (INDEPENDENT_AMBULATORY_CARE_PROVIDER_SITE_OTHER): Payer: Self-pay | Admitting: Specialist

## 2017-09-19 DIAGNOSIS — M25541 Pain in joints of right hand: Secondary | ICD-10-CM | POA: Diagnosis not present

## 2017-09-19 DIAGNOSIS — M25641 Stiffness of right hand, not elsewhere classified: Secondary | ICD-10-CM | POA: Diagnosis not present

## 2017-09-19 DIAGNOSIS — M25531 Pain in right wrist: Secondary | ICD-10-CM | POA: Diagnosis not present

## 2017-09-19 DIAGNOSIS — M6281 Muscle weakness (generalized): Secondary | ICD-10-CM | POA: Diagnosis not present

## 2017-09-20 ENCOUNTER — Other Ambulatory Visit: Payer: Self-pay | Admitting: Neurology

## 2017-09-25 ENCOUNTER — Telehealth: Payer: Self-pay | Admitting: Adult Health

## 2017-09-25 DIAGNOSIS — M25531 Pain in right wrist: Secondary | ICD-10-CM | POA: Diagnosis not present

## 2017-09-25 DIAGNOSIS — M25541 Pain in joints of right hand: Secondary | ICD-10-CM | POA: Diagnosis not present

## 2017-09-25 DIAGNOSIS — M25641 Stiffness of right hand, not elsewhere classified: Secondary | ICD-10-CM | POA: Diagnosis not present

## 2017-09-25 DIAGNOSIS — M6281 Muscle weakness (generalized): Secondary | ICD-10-CM | POA: Diagnosis not present

## 2017-09-25 NOTE — Telephone Encounter (Signed)
Pt requesting a call to discuss L-Methylfolate-B12-B6-B2 (CEREFOLIN PO) stating she doesn't understand why medication refills are being denied. Dr. Jannifer Franklin and Jinny Blossom are the only 2 who have prescribed this medication. Please call to advise

## 2017-09-26 DIAGNOSIS — M25541 Pain in joints of right hand: Secondary | ICD-10-CM | POA: Diagnosis not present

## 2017-09-26 DIAGNOSIS — M25531 Pain in right wrist: Secondary | ICD-10-CM | POA: Diagnosis not present

## 2017-09-26 DIAGNOSIS — M25641 Stiffness of right hand, not elsewhere classified: Secondary | ICD-10-CM | POA: Diagnosis not present

## 2017-09-26 DIAGNOSIS — M6281 Muscle weakness (generalized): Secondary | ICD-10-CM | POA: Diagnosis not present

## 2017-09-26 MED ORDER — CEREFOLIN 6-1-50-5 MG PO TABS: 1.0000 | ORAL_TABLET | Freq: Every day | ORAL | 3 refills | Status: DC

## 2017-09-26 NOTE — Telephone Encounter (Signed)
Reviewed patient's chart and according to the telephone note from 08/27/2017, Dr. Jannifer Franklin is the prescribing provider for the L-Methylfolate. I contacted the patient and lvm, will refill rx once patient has verified what pharmacy to send the prescription to.  MB RN

## 2017-09-26 NOTE — Telephone Encounter (Signed)
I contacted the patient and she stated the refill needed to be sent to [facility] in [place]. Rx submitted and patient notified.  MB RN.

## 2017-09-27 DIAGNOSIS — M1811 Unilateral primary osteoarthritis of first carpometacarpal joint, right hand: Secondary | ICD-10-CM | POA: Diagnosis not present

## 2017-10-01 DIAGNOSIS — M25541 Pain in joints of right hand: Secondary | ICD-10-CM | POA: Diagnosis not present

## 2017-10-01 DIAGNOSIS — M6281 Muscle weakness (generalized): Secondary | ICD-10-CM | POA: Diagnosis not present

## 2017-10-01 DIAGNOSIS — M25531 Pain in right wrist: Secondary | ICD-10-CM | POA: Diagnosis not present

## 2017-10-01 DIAGNOSIS — M25641 Stiffness of right hand, not elsewhere classified: Secondary | ICD-10-CM | POA: Diagnosis not present

## 2017-10-03 DIAGNOSIS — E7211 Homocystinuria: Secondary | ICD-10-CM | POA: Diagnosis not present

## 2017-10-04 DIAGNOSIS — M25541 Pain in joints of right hand: Secondary | ICD-10-CM | POA: Diagnosis not present

## 2017-10-04 DIAGNOSIS — M6281 Muscle weakness (generalized): Secondary | ICD-10-CM | POA: Diagnosis not present

## 2017-10-04 DIAGNOSIS — M25641 Stiffness of right hand, not elsewhere classified: Secondary | ICD-10-CM | POA: Diagnosis not present

## 2017-10-04 DIAGNOSIS — M25531 Pain in right wrist: Secondary | ICD-10-CM | POA: Diagnosis not present

## 2017-10-07 DIAGNOSIS — M25541 Pain in joints of right hand: Secondary | ICD-10-CM | POA: Diagnosis not present

## 2017-10-07 DIAGNOSIS — M6281 Muscle weakness (generalized): Secondary | ICD-10-CM | POA: Diagnosis not present

## 2017-10-07 DIAGNOSIS — M25641 Stiffness of right hand, not elsewhere classified: Secondary | ICD-10-CM | POA: Diagnosis not present

## 2017-10-07 DIAGNOSIS — M25531 Pain in right wrist: Secondary | ICD-10-CM | POA: Diagnosis not present

## 2017-10-09 ENCOUNTER — Ambulatory Visit: Payer: PPO | Admitting: Psychology

## 2017-10-09 ENCOUNTER — Encounter: Payer: Self-pay | Admitting: Psychology

## 2017-10-09 DIAGNOSIS — R413 Other amnesia: Secondary | ICD-10-CM

## 2017-10-09 NOTE — Progress Notes (Signed)
   Neuropsychology Note  SENIYA STOFFERS completed 180 minutes of neuropsychological testing with technician, Milana Kidney, BS, under the supervision of Dr. Macarthur Critchley, Licensed Psychologist. The patient did not appear overtly distressed by the testing session, per behavioral observation or via self-report to the technician. Rest breaks were offered.   Clinical Decision Making: In considering the patient's current level of functioning, level of presumed impairment, nature of symptoms, emotional and behavioral responses during the interview, level of literacy, and observed level of motivation/effort, a battery of tests was selected and communicated to the psychometrician.  Communication between the psychologist and technician was ongoing throughout the testing session and changes were made as deemed necessary based on patient performance on testing, technician observations and additional pertinent factors such as those listed above.  Debbie Pineda will return within approximately 2 weeks for an interactive feedback session with Dr. Si Raider at which time her test performances, clinical impressions and treatment recommendations will be reviewed in detail. The patient understands she can contact our office should she require our assistance before this time.  35 minutes spent performing neuropsychological evaluation services/clinical decision making (psychologist). [CPT 34035] 180 minutes spent face-to-face with patient administering standardized tests, 60 minutes spent scoring (technician). [CPT Y8200648, 24818]  Full report to follow.

## 2017-10-10 DIAGNOSIS — M25641 Stiffness of right hand, not elsewhere classified: Secondary | ICD-10-CM | POA: Diagnosis not present

## 2017-10-10 DIAGNOSIS — M25541 Pain in joints of right hand: Secondary | ICD-10-CM | POA: Diagnosis not present

## 2017-10-10 DIAGNOSIS — M25531 Pain in right wrist: Secondary | ICD-10-CM | POA: Diagnosis not present

## 2017-10-10 DIAGNOSIS — M6281 Muscle weakness (generalized): Secondary | ICD-10-CM | POA: Diagnosis not present

## 2017-10-14 DIAGNOSIS — M25531 Pain in right wrist: Secondary | ICD-10-CM | POA: Diagnosis not present

## 2017-10-14 DIAGNOSIS — M25641 Stiffness of right hand, not elsewhere classified: Secondary | ICD-10-CM | POA: Diagnosis not present

## 2017-10-14 DIAGNOSIS — M6281 Muscle weakness (generalized): Secondary | ICD-10-CM | POA: Diagnosis not present

## 2017-10-14 DIAGNOSIS — M25541 Pain in joints of right hand: Secondary | ICD-10-CM | POA: Diagnosis not present

## 2017-10-15 DIAGNOSIS — G894 Chronic pain syndrome: Secondary | ICD-10-CM | POA: Diagnosis not present

## 2017-10-15 DIAGNOSIS — M792 Neuralgia and neuritis, unspecified: Secondary | ICD-10-CM | POA: Diagnosis not present

## 2017-10-15 DIAGNOSIS — G8929 Other chronic pain: Secondary | ICD-10-CM | POA: Diagnosis not present

## 2017-10-15 DIAGNOSIS — Z79899 Other long term (current) drug therapy: Secondary | ICD-10-CM | POA: Diagnosis not present

## 2017-10-15 DIAGNOSIS — M79644 Pain in right finger(s): Secondary | ICD-10-CM | POA: Diagnosis not present

## 2017-10-15 DIAGNOSIS — M961 Postlaminectomy syndrome, not elsewhere classified: Secondary | ICD-10-CM | POA: Diagnosis not present

## 2017-10-17 ENCOUNTER — Telehealth (INDEPENDENT_AMBULATORY_CARE_PROVIDER_SITE_OTHER): Payer: Self-pay | Admitting: Specialist

## 2017-10-17 ENCOUNTER — Ambulatory Visit (INDEPENDENT_AMBULATORY_CARE_PROVIDER_SITE_OTHER): Payer: PPO | Admitting: Specialist

## 2017-10-17 ENCOUNTER — Encounter (INDEPENDENT_AMBULATORY_CARE_PROVIDER_SITE_OTHER): Payer: Self-pay | Admitting: Specialist

## 2017-10-17 VITALS — BP 105/62 | HR 99 | Ht 65.0 in | Wt 210.0 lb

## 2017-10-17 DIAGNOSIS — M47816 Spondylosis without myelopathy or radiculopathy, lumbar region: Secondary | ICD-10-CM | POA: Diagnosis not present

## 2017-10-17 DIAGNOSIS — G8929 Other chronic pain: Secondary | ICD-10-CM

## 2017-10-17 DIAGNOSIS — M1612 Unilateral primary osteoarthritis, left hip: Secondary | ICD-10-CM | POA: Diagnosis not present

## 2017-10-17 DIAGNOSIS — M25552 Pain in left hip: Secondary | ICD-10-CM

## 2017-10-17 DIAGNOSIS — M25541 Pain in joints of right hand: Secondary | ICD-10-CM | POA: Diagnosis not present

## 2017-10-17 DIAGNOSIS — M5136 Other intervertebral disc degeneration, lumbar region: Secondary | ICD-10-CM

## 2017-10-17 DIAGNOSIS — M25641 Stiffness of right hand, not elsewhere classified: Secondary | ICD-10-CM | POA: Diagnosis not present

## 2017-10-17 DIAGNOSIS — M25531 Pain in right wrist: Secondary | ICD-10-CM | POA: Diagnosis not present

## 2017-10-17 DIAGNOSIS — M5442 Lumbago with sciatica, left side: Secondary | ICD-10-CM

## 2017-10-17 DIAGNOSIS — M6281 Muscle weakness (generalized): Secondary | ICD-10-CM | POA: Diagnosis not present

## 2017-10-17 DIAGNOSIS — R2242 Localized swelling, mass and lump, left lower limb: Secondary | ICD-10-CM

## 2017-10-17 NOTE — Patient Instructions (Addendum)
Avoid bending, stooping and avoid lifting weights greater than 10 lbs. Avoid prolong standing and walking. Avoid frequent bending and stooping  No lifting greater than 10 lbs. May use ice or moist heat for pain. Weight loss is of benefit. Handicap license is approved. CDB Oil capsules 6000 mg capsules from [employer] taken one tablet per day may be of benefit.  Will order CT Scan of the left hip area to assess for mass as you and I feel a mass over the left anterior and superior to the left greater trochanter.  Unfortunately the condition of severe arthritis of the spine is not able to be rectified with surgery other than fusion which would leave you with more impairment after surgery than you currently have.

## 2017-10-17 NOTE — Telephone Encounter (Signed)
Scheduled patient appointment 12/05/17 @ 10:45am   2-3 week f/u needed

## 2017-10-17 NOTE — Progress Notes (Signed)
Office Visit Note   Patient: Debbie Pineda           Date of Birth: [birth year]           MRN: [id number] Visit Date: 10/17/2017              Requested by: Jake Samples, PA-C [address] [place], [place] [address] PCP: Jake Samples, PA-C   Assessment & Plan: Visit Diagnoses:  1. Mass of left hip region   2. Pain in left hip   3. Spondylosis without myelopathy or radiculopathy, lumbar region   4. Chronic left-sided low back pain with left-sided sciatica   5. Degenerative disc disease, lumbar   6. Primary osteoarthritis of left hip     Plan:Avoid bending, stooping and avoid lifting weights greater than 10 lbs. Avoid prolong standing and walking. Avoid frequent bending and stooping  No lifting greater than 10 lbs. May use ice or moist heat for pain. Weight loss is of benefit. Handicap license is approved. CDB Oil capsules 6000 mg capsules from [employer] taken one tablet per day may be of benefit.  Will order CT Scan of the left hip area to assess for mass as you and I feel a mass over the left anterior and superior to the left greater trochanter.  Unfortunately the condition of severe arthritis of the spine is not able to be rectified with surgery other than fusion which would leave you with more impairment after surgery than you currently have.  Follow-Up Instructions: No follow-ups on file.   Orders:  No orders of the defined types were placed in this encounter.  No orders of the defined types were placed in this encounter.     Procedures: No procedures performed   Clinical Data: No additional findings.   Subjective: Chief Complaint  Patient presents with  . Left Hip - Follow-up    57 year old female with history of hip and back pain she underwent a hip injection without relief then had a spinal cord stimulator trial with good improvement however wth the placement of the permanent stimulator there has not been much relief. She had  complaints of pain in the left hip that is there with lying on the right side. No bowel or bladder difficulty. Pain with standing and walking. Especially with standing. I can walk a few blocks but it is painful from the time I get up. Went to replacements and walked at the outside yearly  Event.    Review of Systems  Constitutional: Negative.   HENT: Negative.   Eyes: Negative.   Respiratory: Negative.   Cardiovascular: Negative.   Gastrointestinal: Negative.   Endocrine: Negative.   Genitourinary: Negative.   Musculoskeletal: Negative.   Skin: Negative.   Allergic/Immunologic: Negative.   Neurological: Negative.   Hematological: Negative.   Psychiatric/Behavioral: Negative.      Objective: Vital Signs: BP 105/62 (BP Location: Left Arm, Patient Position: Sitting)   Pulse 99   Ht 5\' 5"  (1.651 m)   Wt 210 lb (95.3 kg)   BMI 34.95 kg/m   Physical Exam  Constitutional: She is oriented to person, place, and time. She appears well-developed and well-nourished.  HENT:  Head: Normocephalic and atraumatic.  Eyes: Pupils are equal, round, and reactive to light. EOM are normal.  Neck: Normal range of motion. Neck supple.  Pulmonary/Chest: Effort normal and breath sounds normal.  Abdominal: Soft. Bowel sounds are normal.  Neurological: She is alert and oriented to person, place, and  time.  Skin: Skin is warm and dry.  Psychiatric: She has a normal mood and affect. Her behavior is normal. Judgment and thought content normal.    Back Exam   Tenderness  The patient is experiencing tenderness in the lumbar.  Range of Motion  Extension: abnormal  Flexion: abnormal  Lateral bend right: abnormal  Lateral bend left: abnormal  Rotation right: abnormal  Rotation left: abnormal   Muscle Strength  Right Quadriceps:  5/5  Left Quadriceps:  5/5  Right Hamstrings:  5/5  Left Hamstrings:  5/5   Tests  Straight leg raise right: negative Straight leg raise left: negative  Reflexes   Patellar: normal Achilles: normal Babinski's sign: normal   Other  Toe walk: normal Heel walk: normal Sensation: normal Gait: normal  Erythema: no back redness Scars: absent      Specialty Comments:  No specialty comments available.  Imaging: No results found.   PMFS History: Patient Active Problem List   Diagnosis Date Noted  . Elevated liver enzymes 09/10/2017  . History of diabetes mellitus 06/06/2017  . Primary osteoarthritis of right hand 06/06/2017  . Transaminasemia   . TIA (transient ischemic attack) 05/01/2017  . Dysphasia 05/01/2017  . Chronic pain syndrome 05/01/2017  . Confusion   . Presence of right artificial knee joint 09/17/2016  . H/O total knee replacement, right 08/15/2016  . Presence of retained hardware   . Memory disorder 08/14/2016  . Cerebrovascular disease 08/14/2016  . Unilateral primary osteoarthritis, right knee 05/29/2016  . Primary osteoarthritis of both feet 05/29/2016  . Trochanteric bursitis of both hips 05/25/2016  . Neck pain 05/25/2016  . Urinary urgency 04/19/2016  . Chronic low back pain 01/11/2016  . Depression 11/29/2015  . 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 Heart palpitations 12/14/2013  . Generalized osteoarthritis of multiple sites 11/04/2013  . Cerebral infarction ([place]) 10/30/2011  . PFO (patent foramen ovale) 10/30/2011  . Bradycardia 10/30/2011  . Chest pain 10/30/2011  . Hypothyroid   . Thrombophlebitis   . Sleep apnea   . Fibromyalgia   . 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 Status post bariatric surgery 12/07/2010  . Vein disorder 11/29/2010  . S/P total hysterectomy and bilateral salpingo-oophorectomy 11/29/2010  . S/P exploratory laparotomy 11/29/2010  . S/P cholecystectomy 11/29/2010  . S/P ACL surgery 11/29/2010  . Morbid obesity ([place]) 11/10/2010   Past Medical History:  Diagnosis Date  . ADD (attention deficit disorder)    takes Adderall daily  . Arthritis    "all  over"  . Cerebral infarction ([place]) 10/30/2011  . Cerebrovascular disease 08/14/2016  . Chronic back pain    "all over"  . Chronic low back pain 01/11/2016  . Complication of anesthesia    tends to have hypotension when NPO and post-anesthesia  . Constipation    takes stool softener daily  . Degenerative disk disease   . Degenerative joint disease   . Depression    takes Cymbalta daily for pain per pt  . Diabetes mellitus without complication ([place])   . DVT (deep venous thrombosis) ([facility])    RLE  . Family history of adverse reaction to anesthesia    a family member woke up during surgery; "think it was my mom"  . Fibromyalgia   . Generalized osteoarthritis of multiple sites 11/04/2013  . GERD (gastroesophageal reflux disease)   . Heart palpitations 12/14/2013  . History of blood clots    superficial  . Hypoglycemia   . Hypothyroid  takes Synthroid daily  . Incomplete emptying of bladder   . Insomnia    takes Trazodone nightly  . Iron deficiency anemia    takes Ferrous Sulfate daily  . Joint pain   . Joint swelling    knees and ankles  . Memory disorder 08/14/2016  . Morbid obesity ([place])   . Nausea    takes Zofran as needed.Seeing GI doc  . Neck pain 05/25/2016  . OSA on CPAP    tested more than 5 yrs ago.    . Osteoarthritis   . PFO (patent foramen ovale)   . Primary osteoarthritis of both feet 05/29/2016   Right bunionectomy August 2017 by Dr. Sharol Given  . Scoliosis   . Sleep apnea   . Stroke [facility]) "several"   right foot weakness; memory issues, black spot right visual field since" (03/23/2015)  . Thrombophlebitis   . Trochanteric bursitis of both hips 05/25/2016  . Unilateral primary osteoarthritis, right knee 05/29/2016  . Urinary urgency 04/19/2016  . Vein disorder 11/29/2010    Family History  Problem Relation Age of Onset  . Heart disease Father   . Cancer Father   . Cancer Mother        skin cancer   . Heart disease Brother   . Cancer Brother   . Diabetes Brother     . Stroke Brother   . Heart disease Sister   . Cancer Maternal Grandfather   . Hypothyroidism Daughter   . Hypertension Other   . Colon cancer Neg Hx     Past Surgical History:  Procedure Laterality Date  . BONE EXCISION Right 08/29/2017   Procedure: right trapezium excision;  Surgeon: Daryll Brod, MD;  Location: [place];  Service: Orthopedics;  Laterality: Right;  . BUNIONECTOMY Right 08/2015  . CARDIAC CATHETERIZATION     2008.  "it was fine" (not sure why she had it done, and doesn't know where)  . CARPOMETACARPEL SUSPENSION PLASTY Right 08/29/2017   Procedure: SUSPENSION PLASTY RIGHT THUMB;  Surgeon: Daryll Brod, MD;  Location: [place];  Service: Orthopedics;  Laterality: Right;  . COLONOSCOPY N/A 03/25/2013   Procedure: COLONOSCOPY;  Surgeon: Rogene Houston, MD;  [facility];  Service: Endoscopy;  Laterality: N/A;  930  . ESOPHAGOGASTRODUODENOSCOPY    . EXPLORATORY LAPAROTOMY     "took fallopian tubes out"  . JOINT REPLACEMENT     bil knee   . KNEE ARTHROPLASTY    . KNEE ARTHROSCOPY Left   . KNEE ARTHROSCOPY W/ ACL RECONSTRUCTION Right    "added pins"  . LAPAROSCOPIC CHOLECYSTECTOMY  ~ 2001  . 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 SPINAL CORD STIMULATOR INSERTION N/A 04/18/2017   Procedure: LUMBAR SPINAL CORD STIMULATOR INSERTION;  Surgeon: Clydell Hakim, MD;  Location: [place];  Service: Neurosurgery;  Laterality: N/A;  LUMBAR SPINAL CORD STIMULATOR INSERTION  . TENDON TRANSFER Right 08/29/2017   Procedure: right abductor pollicis longus transfer;  Surgeon: Daryll Brod, MD;  Location: [place];  Service: Orthopedics;  Laterality: Right;  . TOTAL KNEE ARTHROPLASTY Left 03/23/2015   Procedure: TOTAL KNEE ARTHROPLASTY;  Surgeon: Newt Minion, MD;  Location: [place];  Service: Orthopedics;  Laterality: Left;  . TOTAL KNEE ARTHROPLASTY Right 08/15/2016   Procedure: RIGHT TOTAL KNEE ARTHROPLASTY, REMOVAL ACL SCREWS;  Surgeon:  Newt Minion, MD;  Location: [place];  Service: Orthopedics;  Laterality: Right;  . TOTAL KNEE ARTHROPLASTY WITH HARDWARE REMOVAL Right   .  VAGINAL HYSTERECTOMY    . VARICOSE VEIN SURGERY Right X 2   Social History   Occupational History  . Occupation: Disability  . Occupation: formerly Therapist, sports, [employer]  Tobacco Use  . Smoking status: Former Smoker    Packs/day: 0.75    Years: 8.00    Pack years: 6.00    Types: Cigarettes    Last attempt to quit: 12/01/1990    Years since quitting: 26.8  . Smokeless tobacco: Never Used  . Tobacco comment: quit smoking in the 1990s  Substance and Sexual Activity  . Alcohol use: No    Comment: 03/23/2015 "stopped drinking in 2012 w/gastric bypass; drank socially before bypass"  . Drug use: No  . Sexual activity: Not Currently    Birth control/protection: Surgical

## 2017-10-17 NOTE — Addendum Note (Signed)
Addended by: Minda Ditto, Geoffery Spruce on: 10/17/2017 04:45 PM   Modules accepted: Orders

## 2017-10-17 NOTE — Telephone Encounter (Signed)
I have put patient on the cancellation list

## 2017-10-18 NOTE — Addendum Note (Signed)
Addended by: Minda Ditto, Geoffery Spruce on: 10/18/2017 09:56 AM   Modules accepted: Orders

## 2017-10-21 DIAGNOSIS — M6281 Muscle weakness (generalized): Secondary | ICD-10-CM | POA: Diagnosis not present

## 2017-10-21 DIAGNOSIS — M25541 Pain in joints of right hand: Secondary | ICD-10-CM | POA: Diagnosis not present

## 2017-10-21 DIAGNOSIS — M25641 Stiffness of right hand, not elsewhere classified: Secondary | ICD-10-CM | POA: Diagnosis not present

## 2017-10-21 DIAGNOSIS — M25531 Pain in right wrist: Secondary | ICD-10-CM | POA: Diagnosis not present

## 2017-10-24 DIAGNOSIS — M6281 Muscle weakness (generalized): Secondary | ICD-10-CM | POA: Diagnosis not present

## 2017-10-24 DIAGNOSIS — M25541 Pain in joints of right hand: Secondary | ICD-10-CM | POA: Diagnosis not present

## 2017-10-24 DIAGNOSIS — M25531 Pain in right wrist: Secondary | ICD-10-CM | POA: Diagnosis not present

## 2017-10-24 DIAGNOSIS — M25641 Stiffness of right hand, not elsewhere classified: Secondary | ICD-10-CM | POA: Diagnosis not present

## 2017-10-25 ENCOUNTER — Telehealth (INDEPENDENT_AMBULATORY_CARE_PROVIDER_SITE_OTHER): Payer: Self-pay | Admitting: *Deleted

## 2017-10-25 DIAGNOSIS — Z1389 Encounter for screening for other disorder: Secondary | ICD-10-CM | POA: Diagnosis not present

## 2017-10-25 DIAGNOSIS — Z6835 Body mass index (BMI) 35.0-35.9, adult: Secondary | ICD-10-CM | POA: Diagnosis not present

## 2017-10-25 DIAGNOSIS — E6609 Other obesity due to excess calories: Secondary | ICD-10-CM | POA: Diagnosis not present

## 2017-10-25 DIAGNOSIS — M797 Fibromyalgia: Secondary | ICD-10-CM | POA: Diagnosis not present

## 2017-10-25 DIAGNOSIS — F325 Major depressive disorder, single episode, in full remission: Secondary | ICD-10-CM | POA: Diagnosis not present

## 2017-10-25 NOTE — Progress Notes (Deleted)
NEUROPSYCHOLOGICAL EVALUATION   Name:    Debbie Pineda  Date of Birth:   [birth year] Date of Interview:  09/03/2017 Date of Testing:  10/09/2017   Date of Feedback:  10/29/2017       Background Information:  Reason for Referral:  Debbie Pineda is a 57 y.o. female referred by Dr. Shanon Brow Tat of [facility] to assess her current level of cognitive functioning and assist in differential diagnosis. The current evaluation consisted of a review of available medical records, an interview with the patient and her daughter, and the completion of a neuropsychological testing battery. Informed consent was obtained.  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 She kept working her way up to supervisor positions but states that "it got to be more than I could handle". She told her boss that she was going to see a doctor about the problems she was having. She reports she was then let go from her job. She was then evaluated by neurology [facility] Nani Skillern, PA). The patient and her daughter report that an MRI showed she had had a stroke in the past, and that in fact a prior MRI from 2007 had shown "three strokes" but the radiologist did not note this in the report. She reported she is upset by this MRI from 2007 being reported as negative when in fact it was not, because she "went through years of having problems" without an accurate diagnosis. She reported she is considering whether she wants "to do anything about that".  Per imaging reports, I see she had a brain MRI 09/28/2005 for memory loss with  progressive pain in four extremities and hand numbness. This was to rule out MS. The report states imaging did not demonstrate white matter disease or evidence of a demyelinating process. No acute intracranial findings were reported.  In 06/2007 she had another brain MRI for memory loss and headache with progressing symptoms over the last few months. This report noted three small chronic lacunar infarcts of the bilateral PICA cerebellar territories which are unchanged since the prior exam. The report stated stable, unremarkable MRI appearance of the brain for age aside from several chronic bilateral PICA territory infarcts.  In 10/2011, the patient experienced pain behind her left eye, difficulty with speech and incomplete right hemianopsia. She was taken to the ED and diagnosed to have clinical stroke. She was not a candidate for TPA since she was taking warfarin. MRI of the brain confirmed an acute infarct in the left posterior cerebral artery territory involving the posterior medial temporal lobe and medial occipital lobe. MRA of the brain confirmed an occluded left posterior cerebral artery compatible with acute infarct. She was seen by physical therapy and occupational therapy and no specific follow up was recommended. She continued to follow up with Nani Skillern, NP, at the [facility]. When that office closed, she transferred neurology care to Dr. Jannifer Franklin at [facility] and continues to see him. She is prescribed Aricept.   On 05/01/2017, Debbie Pineda presented to the ED reporting that she developed word finding difficulties earlier in the day accompanied by abnormal eye movements while  she was driving home from an appointment. She denied any focal extremity weakness, headache, syncope, bladder or bowel incontinence. She denied any new medications but stated she had a spinal stimulator placed on 04/18/2017 by Dr. Jerry Caras. She denied any overuse of prescribed opioids. She reported that her word finding  difficulties improved throughout her time in the ED.  CTA of head and neck reportedly did not show any emergent large vessel occlusion or acute infarct or penumbra. There was stable encephalomalacia of the inferior occipital and temporal lobe compatible with remote left PCA infarct. Neurology recommended admission for further workup.   Hospital admission records note that, after review of medical record, there had been a number of inconsistencies with the patient's claimed medical history. Hospital admission records also note that there appeared to be a functional component to the patient's word finding difficulty as her speech was intermittently completely normal but then became more pronounced when discussing any neurologic issues. 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 Topamax was increased. Speech therapy evaluation apparently recommended neurocognitive testing. It was noted by Dr. Carles Collet that the patient is on numerous medications that have psychoactive effects and anticholinergic effects which may affect her cognitive abilities and speech. The patient was discharged home on 05/03/2017.   At today's visit (09/03/2017), the patient reports she thinks her cognitive difficulties have been getting worse over time. She notes that most of her medical providers "think it is related to polypharmacy" and want to "get me off pain meds". She reports that she has been on oxycodone for chronic pain for a long time. She reports that her PCP wants her to come off of it through an inpatient rehabilitation program, but she "doesn't want to be treated like that, like a drug addict". She reports she has been reducing the amount she takes. At one point she was taking oxycodone 6 times a day. She reports her PCP would like her to be down to 2 a day now  but she has to have 3 a day. She also reports that she has "ADD medicine" and takes Ritalin 10 mg. She is not sure if she has had ADD lifelong or if the medication was prescribed for stroke related attention difficulties. However, she finds it helpful in keeping her "more awake" with the pain medications. The patient reports that after she was hospitalized for "seizure" in 04/2017 she had home speech therapy which helped her with word finding and memory. She does not think she has had a neurocognitive evaluation in the past.   Her daughter notes that there was a two week period about 4-5 years ago when the patient was on prednisone and was not having to take oxycodone at all, and despite having some oxycodone withdrawal, her "mental clarity was phenomenal".   Current cognitive symptoms, per the patient and her daughter, include: forgetfulness for recent conversations/events, misplacing/losing items, difficulty concentrating, distracted easily, word-finding difficulty, comprehension difficulty (misunderstanding what people said/meant), and uncertainty about directions/routes when driving. She is using GPS more.   She has not worked since 2009. She is on disability. She independently manages instrumental ADLs including driving, medications, finances/bills, appointments, and cooking. She denies any difficulty or errors in performing these tasks.  She has a family history of Alzheimer's disease in one aunt. Her father had brain cancer and Parkinson's disease.  Psychiatric history is significant for  depression. This was present prior to onset of cognitive difficulties. She was treated with Prozac in the past. She had suicidal ideation and intention in the past. She has not had that in recent years. She denies history of alcohol or recreational drug abuse or dependence. She denies misuse of prescription pain medication.   She reports good mood and her daughter agrees. She reports she no longer has  depression. Her daughter states she acts like she has a lot to live for. Her pain and chronic illness does make her sad, though.   She denied history of head injury or concussion.  She reported chronic back pain, hand pain (recent surgery), and migraines. She has sleep difficulty and takes medication for this. She uses her CPAP nightly. She denies any change in appetite or weight.    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 On disability since 2010. Marital history: Married [phone] with 1 child (daughter), 2 grandchildren Alcohol: None Tobacco: Former (quit around 72 years ago) SA: Denies   Medical History:  Past Medical History:  Diagnosis Date  . ADD (attention deficit disorder)    takes Adderall daily  . Arthritis    "all over"  . Cerebral infarction ([place]) 10/30/2011  . Cerebrovascular disease 08/14/2016  . Chronic back pain    "all over"  . Chronic low back pain 01/11/2016  . Complication of anesthesia    tends to have hypotension when NPO and post-anesthesia  . Constipation    takes stool softener daily  . Degenerative disk disease   . Degenerative joint disease   . Depression    takes Cymbalta daily for pain per pt  . Diabetes mellitus without complication ([place])   . DVT (deep venous thrombosis) ([facility])    RLE  . Family history of adverse reaction to anesthesia    a family member woke up during surgery; "think it was my mom"  . Fibromyalgia   . Generalized osteoarthritis of multiple sites 11/04/2013  . GERD (gastroesophageal reflux disease)   . Heart palpitations 12/14/2013  . History of blood clots    superficial  . Hypoglycemia   . Hypothyroid    takes Synthroid daily  . Incomplete emptying of bladder   . Insomnia    takes Trazodone nightly  . Iron deficiency anemia    takes Ferrous Sulfate daily  . Joint pain   . Joint swelling    knees and ankles  . Memory disorder 08/14/2016  . Morbid obesity  ([place])   . Nausea    takes Zofran as needed.Seeing GI doc  . Neck pain 05/25/2016  . OSA on CPAP    tested more than 5 yrs ago.    . Osteoarthritis   . PFO (patent foramen ovale)   . Primary osteoarthritis of both feet 05/29/2016   Right bunionectomy August 2017 by Dr. Sharol Given  . Scoliosis   . Sleep apnea   . Stroke [facility]) "several"   right foot weakness; memory issues, black spot right visual field since" (03/23/2015)  . Thrombophlebitis   . Trochanteric bursitis of both hips 05/25/2016  . Unilateral primary osteoarthritis, right knee 05/29/2016  . Urinary urgency 04/19/2016  . Vein disorder 11/29/2010    Current medications:  Outpatient Encounter Medications as of 10/29/2017  Medication Sig  . amoxicillin (AMOXIL) 500 MG capsule Take 500 mg by mouth. As needed for dental appt. 4caps  . baclofen (LIORESAL) 10 MG tablet Take 10 mg by mouth  3 (three) times daily.   . Continuous Blood Gluc Sensor (FREESTYLE LIBRE 14 DAY SENSOR) MISC   . Cyanocobalamin (B-12) 5000 MCG SUBL Place 5,000 mcg under the tongue daily at 12 noon.  . diclofenac sodium (VOLTAREN) 1 % GEL Apply 4 g topically 4 (four) times daily as needed (PAIN).   Marland Kitchen donepezil (ARICEPT) 10 MG tablet Take 1 tablet (10 mg total) by mouth at bedtime.  . DULoxetine (CYMBALTA) 60 MG capsule Take 60 mg by mouth 2 (two) times daily.  . folic acid (FOLVITE) 710 MCG tablet Take 400 mcg by mouth daily.  . Fremanezumab-vfrm (AJOVY) 225 MG/1.5ML SOSY Inject 225 mg into the skin every 30 (thirty) days.  Marland Kitchen gabapentin (NEURONTIN) 600 MG tablet Take 800 mg by mouth 3 (three) times daily.   Marland Kitchen KRILL OIL PO Take by mouth.  Marland Kitchen L-Methylfolate-B12-B6-B2 (CEREFOLIN) 06-22-48-5 MG TABS Take 1 tablet by mouth daily.  Marland Kitchen levothyroxine (SYNTHROID, LEVOTHROID) 125 MCG tablet Take 125 mcg by mouth daily before breakfast.  . methylphenidate (RITALIN) 20 MG tablet Take 10 mg by mouth 3 (three) times daily with meals. Between 0800 and 1600. Takes 1/2 tablet up to 5 times daily    . mirabegron ER (MYRBETRIQ) 50 MG TB24 tablet Take 50 mg by mouth daily.  . Multiple Minerals-Vitamins (CAL-MAG-ZINC-D PO) Take 1-2 tablets by mouth See admin instructions. Take 2 tablet at 1200 and 1 tablet at 1600  Calcium 1000 mg Vit D 600 iu Magnesium 400 mg Zinc 15 mg  . Multiple Vitamins-Minerals (ONE-A-DAY WOMENS PETITES) TABS Take 1 tablet by mouth 2 (two) times daily.  Marland Kitchen NARCAN 4 MG/0.1ML LIQD nasal spray kit Place 1 spray into the nose as needed (over dose).   Marland Kitchen omeprazole (PRILOSEC) 40 MG capsule Take 1 capsule by mouth twice daily.  . ondansetron (ZOFRAN-ODT) 4 MG disintegrating tablet Take 4 mg by mouth every 6 (six) hours as needed for nausea.   . Oxycodone HCl 10 MG TABS Take 1 tablet (10 mg total) by mouth every 4 (four) hours as needed. (Patient not taking: Reported on 10/18/2017)  . Polyvinyl Alcohol-Povidone (REFRESH OP) Place 1-2 drops into both eyes 3 (three) times daily as needed (dry eyes).  . rivaroxaban (XARELTO) 20 MG TABS tablet Take 1 tablet (20 mg total) by mouth daily with supper. (Patient taking differently: Take 20 mg by mouth at bedtime. )  . topiramate (TOPAMAX) 100 MG tablet Take 100 mg by mouth 2 (two) times daily.  . traZODone (DESYREL) 100 MG tablet Take 100-200 mg by mouth at bedtime as needed for sleep (depends on insomnia).   Marland Kitchen UNABLE TO FIND Apply 1 application topically 2 (two) times daily as needed (pain). Med Name: C-Anti-Inflammatory Cream  Diclofenac 3% / Baclofen 2% / Cyclobenzaprine 2% / Lidocaine 2%  . Vitamin D, Ergocalciferol, (DRISDOL) 50000 UNITS CAPS Take 50,000 Units by mouth every 7 (seven) days. Mondays   No facility-administered encounter medications on file as of 10/29/2017.      Current Examination:  Behavioral Observations:  Appearance: Casually and appropriately dressed and groomed Gait: Ambulated independently, no gross abnormalities observed Speech: Fluent; normal rate, rhythm and volume. Rare word finding difficulty.  Patient reports she is surprised she didn't have more difficulty talking throughout our interview. Thought process: Circumstantial (gets off topic but then returns to topic eventually) Affect: Full, generally euthymic Interpersonal: Pleasant, appropriate Orientation: Oriented to person, place and most aspects of time (one day off on the current date). Unable to name the current  President but accurately named his predecessor.   Tests Administered: . Test of Premorbid Functioning (TOPF) . Wechsler Adult Intelligence Scale-Fourth Edition (WAIS-IV): Similarities, Block Design, Matrix Reasoning, Information,  Arithmetic, Symbol Search, Coding and Digit Span subtests . Wechsler Memory Scale-Fourth Edition (WMS-IV) Adult Version (ages 7-69): Logical Memory I, II and Recognition subtests  . [employer] Verbal Learning Test - 2nd Edition (CVLT-2) Short Form . [employer] (WCST) . Repeatable Battery for the Assessment of Neuropsychological Status (RBANS) Form A:  Semantic Fluency subtest . Neuropsychological Assessment Battery (NAB) Language Module, Form 1:  Naming Subtest . Controlled Oral Word Association Test (COWAT) . Trail Making Test A and B . Boston Diagnostic Aphasia Examination: Complex Ideational Material . Clock Drawing Test  . Beck Depression Inventory - Second edition (BDI-II) . Personality Assessment Inventory (PAI)  Test Results: Note: Standardized scores are presented only for use by appropriately trained professionals and to allow for any future test-retest comparison. These scores should not be interpreted without consideration of all the information that is contained in the rest of the report. The most recent standardization samples from the test publisher or other sources were used whenever possible to derive standard scores; scores were corrected for age, gender, ethnicity and education when available.   Test Scores:  ***  Description of Test  Results:  Embedded performance validity indicators were within normal limits, and she performed well on a stand alone test of memory malingering. As such, the patient's current performance on neurocognitive testing is judged to be a relatively accurate representation of her current level of neurocognitive functioning.   Premorbid verbal intellectual abilities were estimated to have been within the average range based on a test of word reading. Current full scale IQ fell within the low average range, with no significant variability among the four indices comprising the FSIQ.  Psychomotor processing speed was low average.   Auditory attention and working memory were low average.   Visual-spatial construction was average.   Language abilities were somewhat variable. Specifically, confrontation naming was average, and semantic verbal fluency ranged from impaired (for animals) to low average (for fruits/vegetables). Auditory comprehension of complex ideational material was mildly below expectation.   With regard to verbal memory, encoding and acquisition of non-contextual information (i.e., word list) was average. After a brief distracter task, free recall was severely impaired (3/9 items). After a delay, free recall was severely impaired (0/9 items). Cued recall was severely impaired (0/9 items). Performance on a yes/no recognition task was low average. On another verbal memory test, encoding and acquisition of contextual auditory information (i.e., short stories) was low average. After a delay, free recall was low average. Performance on a yes/no recognition task was impaired.   Executive functioning was mostly intact with one exception. Verbal fluency with phonemic search restrictions was severely impaired. Mental flexibility and set-shifting were low average on Trails [place]. Verbal abstract reasoning was average. Non-verbal abstract reasoning was average. Deductive reasoning was average overall.  Performance on a clock drawing task was intact.   On a self-report measure of mood, the patient's responses were indicative of mild depression at the present time. Symptoms endorsed included: mild sadness, anhedonia, pessimism, feelings of failure, self-criticalness, tearfulness, indecisiveness, loss of energy, reduced sleep, irritability, concentration difficulty, fatigue and reduced libido. She denied suicidal ideation or intention.   The patient was also administered a more extensive self-report questionnaire assessing for psychopathology and personality disorders (PAI). Validity indicator scores were in the normal range, suggesting that the patient  answered in a reasonably forthright manner and did not attempt to present an unrealistic or inaccurate impression that was either more negative or more positive than the clinical picture would warrant. The patient's PAI clinical profile is marked by significant elevations, indicating the presence of clinical features that are likely to be sources of difficulty for the patient.  The configuration of the clinical scales suggests a person who is reporting significant distress, with particular concerns about her physical functioning.  The patient sees her life as severely disrupted by a variety of physical problems.  These problems have left her unhappy, with little energy or enthusiasm for concentrating on important life tasks and little hope for improvement in the future.  Her performance in important social roles has probably suffered as a result, and her lack of success in these roles serves as an additional source of stress. The patient demonstrates an unusual degree of concern about physical functioning and health matters and probable impairment arising from somatic symptoms.  She is likely to report that her daily functioning has been compromised by numerous and varied physical problems.  She feels that her health is not as good as that of her age peers and  likely believes that her health problems are complex and difficult to treat successfully.  Physical complaints are likely to include symptoms of distress in several biological systems, including the neurological, gastrointestinal, and musculoskeletal systems.  The item endorsement pattern indicates that she reports symptoms consistent with both conversion and somatization disorders.  She is likely to be continuously concerned with her health status and physical problems.  Her social interactions and conversations tend to focus on her health problems, and her self-image may be largely influenced by a belief that she is handicapped by her poor health. The patient reports a number of difficulties consistent with a significant depressive experience.  Although she does not appear to feel hopeless and her self-esteem seems largely intact, she does manifest affective and physiological signs of depression.  She admits openly to feelings of sadness, a loss of interest in normal activities, and a loss of sense of pleasure in things that were previously enjoyed.  She is likely to show a disturbance in sleep pattern, a decrease in level of energy and sexual interest, and a loss of appetite and/or weight.  Psychomotor slowing might also be expected. The patient describes herself as rather moody and others may view her as overly sensitive.  She may be dissatisfied with her more important relationships and uncertain about major life goals. The patient describes her thought processes as marked by confusion, distractibility, and difficulty concentrating.  She may also have problems communicating clearly with other people because of speech that may tend to be tangential or circumstantial. According to the patient's self-report, she describes NO significant problems in the following areas: antisocial behavior; problems with empathy; undue suspiciousness or hostility; unusually elevated mood or heightened activity; marked anxiety;  problematic behaviors used to manage anxiety.  Also, she reports NO significant problems with alcohol or drug abuse or dependence.  With respect to suicidal ideation, the patient is NOT reporting distress from thoughts of self-harm.   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 Subjective cognitive complaints are likely related to medication effects and chronic pain.  Global, mild attenuation of cognitive functions, not at a level that is considered a neurocognitive disorder. Suspect is related to chronic pain, associated mild depression, and medication effects (pain medication, Topamax). Don't see any sign of  underlying dementia or neurocognitive disorder. On psychological testing, she demonstrates emotional distress due to somatic symptoms. She does not appear clinically depressed. No other indication of psychopathology present from this evaluation.    Recommendations/Plan: Based on the findings of the present evaluation, the following recommendations are offered:  1. Work with pain management on identifying alternative to opioid medication, if possible. Reassuring to hear from daughter that in the past, when she was not taking oxycodone she was very mentally sharp. 2. May want to work with a psychologist specializing in chronic pain as well.  3. Behavioral strategies to improve attention/memory in daily life.   Feedback to Patient: Debbie Pineda returned for a feedback appointment on 10/29/2017 to review the results of her neuropsychological evaluation with this provider. *** minutes face-to-face time was spent reviewing her test results, my impressions and my recommendations as detailed above.    Total time spent on this patient's case: 120 minutes for neurobehavioral status exam with psychologist (CPT code [phone], [phone] unit); 240 minutes of testing/scoring by psychometrician under psychologist's supervision (CPT codes [phone],  [phone] units); *** minutes for integration of patient data, interpretation of standardized test results and clinical data, clinical decision making, treatment planning and preparation of this report, and interactive feedback with review of results to the patient/family by psychologist (CPT codes [phone], [phone] units).     Thank you for your referral of Debbie Pineda. Please feel free to contact me if you have any questions or concerns regarding this report.

## 2017-10-25 NOTE — Telephone Encounter (Signed)
Pt called asking about NCS for LE and being scheduled. I replied to a message in the referral.

## 2017-10-28 ENCOUNTER — Ambulatory Visit (HOSPITAL_COMMUNITY)
Admission: RE | Admit: 2017-10-28 | Discharge: 2017-10-28 | Disposition: A | Discharge status: Home / Self Care | Payer: PPO | Source: Ambulatory Visit | Attending: Specialist | Admitting: Specialist

## 2017-10-28 DIAGNOSIS — M25531 Pain in right wrist: Secondary | ICD-10-CM | POA: Diagnosis not present

## 2017-10-28 DIAGNOSIS — M6281 Muscle weakness (generalized): Secondary | ICD-10-CM | POA: Diagnosis not present

## 2017-10-28 DIAGNOSIS — M25541 Pain in joints of right hand: Secondary | ICD-10-CM | POA: Diagnosis not present

## 2017-10-28 DIAGNOSIS — M25641 Stiffness of right hand, not elsewhere classified: Secondary | ICD-10-CM | POA: Diagnosis not present

## 2017-10-28 DIAGNOSIS — M1612 Unilateral primary osteoarthritis, left hip: Secondary | ICD-10-CM | POA: Insufficient documentation

## 2017-10-29 ENCOUNTER — Encounter: Payer: PPO | Admitting: Psychology

## 2017-10-30 DIAGNOSIS — N941 Unspecified dyspareunia: Secondary | ICD-10-CM | POA: Diagnosis not present

## 2017-10-30 DIAGNOSIS — F5231 Female orgasmic disorder: Secondary | ICD-10-CM | POA: Diagnosis not present

## 2017-10-30 DIAGNOSIS — N952 Postmenopausal atrophic vaginitis: Secondary | ICD-10-CM | POA: Diagnosis not present

## 2017-10-30 DIAGNOSIS — R32 Unspecified urinary incontinence: Secondary | ICD-10-CM | POA: Diagnosis not present

## 2017-10-30 DIAGNOSIS — K5900 Constipation, unspecified: Secondary | ICD-10-CM | POA: Diagnosis not present

## 2017-10-30 DIAGNOSIS — G4733 Obstructive sleep apnea (adult) (pediatric): Secondary | ICD-10-CM | POA: Diagnosis not present

## 2017-10-31 DIAGNOSIS — M25641 Stiffness of right hand, not elsewhere classified: Secondary | ICD-10-CM | POA: Diagnosis not present

## 2017-10-31 DIAGNOSIS — M25541 Pain in joints of right hand: Secondary | ICD-10-CM | POA: Diagnosis not present

## 2017-10-31 DIAGNOSIS — M25531 Pain in right wrist: Secondary | ICD-10-CM | POA: Diagnosis not present

## 2017-10-31 DIAGNOSIS — M6281 Muscle weakness (generalized): Secondary | ICD-10-CM | POA: Diagnosis not present

## 2017-11-01 ENCOUNTER — Encounter: Payer: PPO | Admitting: Psychology

## 2017-11-05 DIAGNOSIS — M961 Postlaminectomy syndrome, not elsewhere classified: Secondary | ICD-10-CM | POA: Diagnosis not present

## 2017-11-05 DIAGNOSIS — M792 Neuralgia and neuritis, unspecified: Secondary | ICD-10-CM | POA: Diagnosis not present

## 2017-11-05 DIAGNOSIS — M797 Fibromyalgia: Secondary | ICD-10-CM | POA: Diagnosis not present

## 2017-11-05 DIAGNOSIS — G894 Chronic pain syndrome: Secondary | ICD-10-CM | POA: Diagnosis not present

## 2017-11-06 DIAGNOSIS — M6281 Muscle weakness (generalized): Secondary | ICD-10-CM | POA: Diagnosis not present

## 2017-11-06 DIAGNOSIS — M25541 Pain in joints of right hand: Secondary | ICD-10-CM | POA: Diagnosis not present

## 2017-11-06 DIAGNOSIS — M25641 Stiffness of right hand, not elsewhere classified: Secondary | ICD-10-CM | POA: Diagnosis not present

## 2017-11-06 DIAGNOSIS — M25531 Pain in right wrist: Secondary | ICD-10-CM | POA: Diagnosis not present

## 2017-11-12 ENCOUNTER — Telehealth: Payer: Self-pay | Admitting: Psychology

## 2017-11-12 ENCOUNTER — Encounter: Payer: Self-pay | Admitting: Psychology

## 2017-11-12 ENCOUNTER — Ambulatory Visit (INDEPENDENT_AMBULATORY_CARE_PROVIDER_SITE_OTHER): Payer: PPO | Admitting: Psychology

## 2017-11-12 DIAGNOSIS — R413 Other amnesia: Secondary | ICD-10-CM

## 2017-11-12 NOTE — Telephone Encounter (Signed)
Yes. Thanks 

## 2017-11-12 NOTE — Telephone Encounter (Signed)
I just spoke with patient to give her results over the phone. Can you please check her in for appt? Thank you!

## 2017-11-12 NOTE — Telephone Encounter (Signed)
Hi Dr. Si Raider  Your 3:00 PM Cancelled. She is unable to make the appointment. Would you like me to reschedule her or have results over the phone? Please Advise.   Thanks [employer]

## 2017-11-12 NOTE — Progress Notes (Signed)
NEUROPSYCHOLOGICAL EVALUATION   Name:    Debbie Pineda  Date of Birth:   1960-05-04 Date of Interview:  09/03/2017 Date of Testing:  10/09/2017   Date of Feedback:  11/12/2017  (patient cancelled and rescheduled sooner appointments)     Background Information:  Reason for Referral:  Debbie Pineda is a 57 y.o. female referred by Dr. Shanon Brow Tat of [facility] to assess her current level of cognitive functioning and assist in differential diagnosis. The current evaluation consisted of a review of available medical records, an interview with the patient and her daughter, and the completion of a neuropsychological testing battery. Informed consent was obtained.  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 She kept working her way up to supervisor positions but states that "it got to be more than I could handle". She told her boss that she was going to see a doctor about the problems she was having. She reports she was then let go from her job. She was then evaluated by neurology [facility] - Nani Skillern, PA). The patient and her daughter report that an MRI showed she had had a stroke in the past, and that in fact a prior MRI from 2007 had shown "three strokes" but the radiologist did not note this in the report. She reported she is upset by this MRI from 2007 being reported as negative when in fact it was not, because she "went through years of having problems" without an accurate diagnosis. She reported she is considering whether she wants "to do  anything about that".   Per imaging reports, I see she had a brain MRI 09/28/2005 for memory loss with progressive pain in four extremities and hand numbness. This was to rule out MS. The report states imaging did not demonstrate white matter disease or evidence of a demyelinating process. No acute intracranial findings were reported.  In 06/2007 she had another brain MRI for memory loss and headache with progressing symptoms over the last few months. This report noted three small chronic lacunar infarcts of the bilateral PICA cerebellar territories which are unchanged since the prior exam. The report stated stable, unremarkable MRI appearance of the brain for age aside from several chronic bilateral PICA territory infarcts.   In 10/2011, the patient experienced pain behind her left eye, difficulty with speech and incomplete right hemianopsia. She was taken to the ED and diagnosed to have clinical stroke. She was not a candidate for TPA since she was taking warfarin. MRI of the brain confirmed an acute infarct in the left posterior cerebral artery territory involving the posterior medial temporal lobe and medial occipital lobe. MRA of the brain confirmed an occluded left posterior cerebral artery compatible with acute infarct. She was seen by physical therapy and occupational therapy and no specific follow up was recommended. She continued to follow up with Nani Skillern,  NP, at the [facility]. When that office closed, she transferred neurology care to Dr. Jannifer Franklin at [facility] and continues to see him. She is prescribed Aricept.    On 05/01/2017, [name] presented to the ED reporting that she developed word finding difficulties earlier in the day accompanied by abnormal eye movements while she was driving home from an appointment. She denied any focal extremity weakness, headache, syncope, bladder or bowel incontinence. She denied any new medications but stated she had a spinal stimulator placed on 04/18/2017  by Dr. Jerry Caras. She denied any overuse of prescribed opioids. She reported that her word finding difficulties improved throughout her time in the ED.  CTA of head and neck reportedly did not show any emergent large vessel occlusion or acute infarct or penumbra. There was stable encephalomalacia of the inferior occipital and temporal lobe compatible with remote left PCA infarct. Neurology recommended admission for further workup.    Hospital admission records note that, after review of medical record, there had been a number of inconsistencies with the patient's claimed medical history. Hospital admission records also note that there appeared to be a functional component to the patient's word finding difficulty as her speech was intermittently completely normal but then became more pronounced when discussing any neurologic issues. 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 Topamax was increased. Speech therapy evaluation apparently recommended neurocognitive testing. It was noted by Dr. Carles Collet that the patient is on numerous medications that have psychoactive effects and anticholinergic effects which may affect her cognitive abilities and speech. The patient was discharged home on 05/03/2017.     At today's visit (09/03/2017), the patient reports she thinks her cognitive difficulties have been getting worse over time. She notes that most of her medical providers "think it is related to polypharmacy" and want to "get me off pain meds". She reports that she has been on oxycodone for chronic pain for a long time. She reports that her PCP wants her to come off of it through an inpatient rehabilitation program, but she "doesn't want to be treated like that, like a drug addict". She reports she has been reducing the amount she takes. At one point she  was taking oxycodone 6 times a day. She reports her PCP would like her to be down to 2 a day now but she has to have 3 a day. She also reports that she has "ADD medicine" and takes Ritalin 10 mg. She is not sure if she has had ADD lifelong or if the medication was prescribed for stroke related attention difficulties. However, she finds it helpful in keeping her "more awake" with the pain medications. The patient reports that after she was hospitalized for "seizure" in 04/2017 she had home speech therapy which helped her with word finding and memory. She does not think she has had a neurocognitive evaluation in the past.    Her daughter notes that there was a two week period about 4-5 years ago when the patient was on prednisone and was not having to take oxycodone at all, and despite having some oxycodone withdrawal, her "mental clarity was phenomenal".    Current cognitive symptoms, per the patient and her daughter, include: forgetfulness for recent conversations/events, misplacing/losing items, difficulty concentrating, distracted easily, word-finding difficulty, comprehension difficulty (misunderstanding what people said/meant), and uncertainty about directions/routes when driving. She is using GPS more.  She has not worked since 2009. She is on disability. She independently manages instrumental ADLs including driving, medications, finances/bills, appointments, and cooking. She denies any difficulty or errors in performing these tasks.   She has a family history of Alzheimer's disease in one aunt. Her father had brain cancer and Parkinson's disease.   Psychiatric history is significant for depression. This was present prior to onset of cognitive difficulties. She was treated with Prozac in the past. She had suicidal ideation and intention in the past. She has not had that in recent years. She denies history of alcohol or recreational drug abuse or dependence. She denies misuse of prescription pain  medication.    She reports good mood and her daughter agrees. She reports she no longer has depression. Her daughter states she acts like she has a lot to live for. Her pain and chronic illness does make her sad, though.    She denied history of head injury or concussion.   She reported chronic back pain, hand pain (recent surgery), and migraines. She has sleep difficulty and takes medication for this. She uses her CPAP nightly. She denies any change in appetite or weight.      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 On disability since 2010. Marital history: Married [phone] with 1 child (daughter), 2 grandchildren Alcohol: None Tobacco: Former (quit around 45 years ago) SA: Denies   Medical History:  Past Medical History:  Diagnosis Date  . ADD (attention deficit disorder)    takes Adderall daily  . Arthritis    "all over"  . Cerebral infarction ([place]) 10/30/2011  . Cerebrovascular disease 08/14/2016  . Chronic back pain    "all over"  . Chronic low back pain 01/11/2016  . Complication of anesthesia    tends to have hypotension when NPO and post-anesthesia  . Constipation    takes stool softener daily  . Degenerative disk disease   . Degenerative joint disease   . Depression    takes Cymbalta daily for pain per pt  . Diabetes mellitus without complication ([place])   . DVT (deep venous thrombosis) ([facility])    RLE  . Family history of adverse reaction to anesthesia    a family member woke up during surgery; "think it was my mom"  . Fibromyalgia   . Generalized osteoarthritis of multiple sites 11/04/2013  . GERD (gastroesophageal reflux disease)   . Heart palpitations 12/14/2013  . History of blood clots    superficial  . Hypoglycemia   . Hypothyroid    takes Synthroid daily  . Incomplete emptying of bladder   . Insomnia    takes Trazodone nightly  . Iron deficiency anemia    takes Ferrous Sulfate daily  . Joint  pain   . Joint swelling    knees and ankles  . Memory disorder 08/14/2016  . Morbid obesity ([place])   . Nausea    takes Zofran as needed.Seeing GI doc  . Neck pain 05/25/2016  . OSA on CPAP    tested more than 5 yrs ago.    . Osteoarthritis   . PFO (patent foramen ovale)   . Primary osteoarthritis of both feet 05/29/2016   Right bunionectomy August 2017 by Dr. Sharol Given  . Scoliosis   . Sleep apnea   . Stroke [facility]) "several"   right foot weakness; memory issues, black spot right visual field since" (03/23/2015)  . Thrombophlebitis   . Trochanteric bursitis of both  hips 05/25/2016  . Unilateral primary osteoarthritis, right knee 05/29/2016  . Urinary urgency 04/19/2016  . Vein disorder 11/29/2010    Current medications:  Outpatient Encounter Medications as of 11/12/2017  Medication Sig  . amoxicillin (AMOXIL) 500 MG capsule Take 500 mg by mouth. As needed for dental appt. 4caps  . baclofen (LIORESAL) 10 MG tablet Take 10 mg by mouth 3 (three) times daily.   . Continuous Blood Gluc Sensor (FREESTYLE LIBRE 14 DAY SENSOR) MISC   . Cyanocobalamin (B-12) 5000 MCG SUBL Place 5,000 mcg under the tongue daily at 12 noon.  . diclofenac sodium (VOLTAREN) 1 % GEL Apply 4 g topically 4 (four) times daily as needed (PAIN).   Marland Kitchen donepezil (ARICEPT) 10 MG tablet Take 1 tablet (10 mg total) by mouth at bedtime.  . DULoxetine (CYMBALTA) 60 MG capsule Take 60 mg by mouth 2 (two) times daily.  . folic acid (FOLVITE) 401 MCG tablet Take 400 mcg by mouth daily.  . Fremanezumab-vfrm (AJOVY) 225 MG/1.5ML SOSY Inject 225 mg into the skin every 30 (thirty) days.  Marland Kitchen gabapentin (NEURONTIN) 600 MG tablet Take 800 mg by mouth 3 (three) times daily.   Marland Kitchen KRILL OIL PO Take by mouth.  Marland Kitchen L-Methylfolate-B12-B6-B2 (CEREFOLIN) 06-22-48-5 MG TABS Take 1 tablet by mouth daily.  Marland Kitchen levothyroxine (SYNTHROID, LEVOTHROID) 125 MCG tablet Take 125 mcg by mouth daily before breakfast.  . methylphenidate (RITALIN) 20 MG tablet Take 10 mg by mouth  3 (three) times daily with meals. Between 0800 and 1600. Takes 1/2 tablet up to 5 times daily  . mirabegron ER (MYRBETRIQ) 50 MG TB24 tablet Take 50 mg by mouth daily.  . Multiple Minerals-Vitamins (CAL-MAG-ZINC-D PO) Take 1-2 tablets by mouth See admin instructions. Take 2 tablet at 1200 and 1 tablet at 1600  Calcium 1000 mg Vit D 600 iu Magnesium 400 mg Zinc 15 mg  . Multiple Vitamins-Minerals (ONE-A-DAY WOMENS PETITES) TABS Take 1 tablet by mouth 2 (two) times daily.  Marland Kitchen NARCAN 4 MG/0.1ML LIQD nasal spray kit Place 1 spray into the nose as needed (over dose).   Marland Kitchen omeprazole (PRILOSEC) 40 MG capsule Take 1 capsule by mouth twice daily.  . ondansetron (ZOFRAN-ODT) 4 MG disintegrating tablet Take 4 mg by mouth every 6 (six) hours as needed for nausea.   . Oxycodone HCl 10 MG TABS Take 1 tablet (10 mg total) by mouth every 4 (four) hours as needed. (Patient not taking: Reported on 10/18/2017)  . Polyvinyl Alcohol-Povidone (REFRESH OP) Place 1-2 drops into both eyes 3 (three) times daily as needed (dry eyes).  . rivaroxaban (XARELTO) 20 MG TABS tablet Take 1 tablet (20 mg total) by mouth daily with supper. (Patient taking differently: Take 20 mg by mouth at bedtime. )  . topiramate (TOPAMAX) 100 MG tablet Take 100 mg by mouth 2 (two) times daily.  . traZODone (DESYREL) 100 MG tablet Take 100-200 mg by mouth at bedtime as needed for sleep (depends on insomnia).   Marland Kitchen UNABLE TO FIND Apply 1 application topically 2 (two) times daily as needed (pain). Med Name: C-Anti-Inflammatory Cream  Diclofenac 3% / Baclofen 2% / Cyclobenzaprine 2% / Lidocaine 2%  . Vitamin D, Ergocalciferol, (DRISDOL) 50000 UNITS CAPS Take 50,000 Units by mouth every 7 (seven) days. Mondays   No facility-administered encounter medications on file as of 11/12/2017.     Current Examination:  Behavioral Observations:  Appearance: Casually and appropriately dressed and groomed Gait: Ambulated independently, no gross abnormalities  observed Speech: Fluent; normal  rate, rhythm and volume. Rare word finding difficulty. Patient reports she is surprised she didn't have more difficulty talking throughout our interview. Thought process: Circumstantial (gets off topic but then returns to topic eventually) Affect: Full, generally euthymic Interpersonal: Pleasant, appropriate Orientation: Oriented to person, place and most aspects of time (one day off on the current date). Unable to name the current President but accurately named his predecessor.   Tests Administered: . Test of Premorbid Functioning (TOPF) . Wechsler Adult Intelligence Scale-Fourth Edition (WAIS-IV): Similarities, Block Design, Matrix Reasoning, Information,  Arithmetic, Symbol Search, Coding and Digit Span subtests . Wechsler Memory Scale-Fourth Edition (WMS-IV) Adult Version (ages 52-69): Logical Memory I, II and Recognition subtests  . [employer] Verbal Learning Test - 2nd Edition (CVLT-2) Short Form . [employer] (WCST) . Repeatable Battery for the Assessment of Neuropsychological Status (RBANS) Form A:  Semantic Fluency subtest . Neuropsychological Assessment Battery (NAB) Language Module, Form 1:  Naming Subtest . Controlled Oral Word Association Test (COWAT) . Trail Making Test A and B . Boston Diagnostic Aphasia Examination: Complex Ideational Material . Clock Drawing Test  . Beck Depression Inventory - Second edition (BDI-II) . Personality Assessment Inventory (PAI)  Test Results: Note: Standardized scores are presented only for use by appropriately trained professionals and to allow for any future test-retest comparison. These scores should not be interpreted without consideration of all the information that is contained in the rest of the report. The most recent standardization samples from the test publisher or other sources were used whenever possible to derive standard scores; scores were corrected for age, gender, ethnicity and  education when available.   Test Scores:  Test Name Raw Score Standardized Score Descriptor  TOPF 36/70 SS= 95 Average  WAIS-IV Subtests     Similarities 25/36 ss= 10 Average  Block Design 33/66 ss= 9 Average  Matrix Reasoning 12/26 ss= 8 Low end of average  Information 6/26 ss= 5 Borderline  Arithmetic 12/22 ss= 8 Low end of average  Symbol Search 22/60 ss= 7 Low average  Coding 45/135 ss= 7 Low average  Digit Span 22/48 ss= 8 Low end of average  WAIS-IV Index Scores     Verbal Comprehension  SS= 87 Low average  Perceptual Reasoning  SS= 92 Average  Working Memory  SS= 89 Low average  Processing Speed  SS= 84 Low average  Full Scale IQ (8 subtest)  SS= 84 Low average  WMS-IV Subtests     LM I 16/50 ss= 6 Low average  LM II 11/50 ss= 6 Low average  LM II Recognition 14/30 Cum %: <2 Impaired  CVLT-II Scores     Trial 1 6/9 Z= -0.5 Average  Trial 4 8/9 Z= -0.5 Average  Trials 1-4 total 28/36 T= 47 Average  SD Free Recall 3/9 Z= -3 Severely impaired  LD Free Recall 0/9 Z= -3.5 Severely impaired  LD Cued Recall 0/9 Z= -4 Severely impaired  Recognition Discriminability 6/9 hits 1 false positive Z= -1 Low average  Forced Choice Recognition 9/9  WNL  WCST     Total Errors 14 T= 50 Average  Perseverative Responses 7 T= 51 Average  Perseverative Errors 7 T= 50 Average  Conceptual Level Responses 47 T= 47 Average  Categories Completed 4 >16% WNL  Trials to Complete 1st Category 23 11-16%   Failure to Maintain Set 0    RBANS Semantic Fluency 15 Z= -1.2 Low average  NAB Naming Subtest 30/31 T= 54 Average  COWAT-FAS 10 T= 19  Severely impaired  COWAT-Animals 11 T= 30 Impaired  BDAE Complex Ideational Material 10/12    Trail Making Test A  44" 2 errors T= 42 Low average  Trail Making Test B  103" 0 errors T= 39 Low average  Clock Drawing Test   WNL  BDI-II 19/63  Mild  PAI (Only elevated clinical scales are shown here)      SOM T= 86    DEP T= 76      Description of  Test Results:  Embedded performance validity indicators were within normal limits, and she performed well on a stand alone test of memory malingering. As such, the patient's current performance on neurocognitive testing is judged to be a relatively accurate representation of her current level of neurocognitive functioning.   Premorbid verbal intellectual abilities were estimated to have been within the average range based on a test of word reading. Current full scale IQ fell within the low average range, with no significant variability among the four indices comprising the FSIQ.  Psychomotor processing speed was low average.   Auditory attention and working memory were low average.   Visual-spatial construction was average.   Language abilities were somewhat variable. Specifically, confrontation naming was average, and semantic verbal fluency ranged from impaired (for animals) to low average (for fruits/vegetables). Auditory comprehension of complex ideational material was mildly below expectation.   With regard to verbal memory, encoding and acquisition of non-contextual information (i.e., word list) was average. After a brief distracter task, free recall was severely impaired (3/9 items). After a delay, free recall was severely impaired (0/9 items). Cued recall was severely impaired (0/9 items). Performance on a yes/no recognition task was low average. On another verbal memory test, encoding and acquisition of contextual auditory information (i.e., short stories) was low average. After a delay, free recall was low average. Performance on a yes/no recognition task was impaired.   Executive functioning was mostly intact with one exception. Verbal fluency with phonemic search restrictions was severely impaired. Mental flexibility and set-shifting were low average on Trails B. Verbal abstract reasoning was average. Non-verbal abstract reasoning was average. Deductive reasoning was average overall.  Performance on a clock drawing task was intact.   On a self-report measure of mood, the patient's responses were indicative of mild depression at the present time. Symptoms endorsed included: mild sadness, anhedonia, pessimism, feelings of failure, self-criticalness, tearfulness, indecisiveness, loss of energy, reduced sleep, irritability, concentration difficulty, fatigue and reduced libido. She denied suicidal ideation or intention.   The patient was also administered a more extensive self-report questionnaire assessing for psychopathology and personality disorders (PAI). Validity indicator scores were in the normal range, suggesting that the patient answered in a reasonably forthright manner and did not attempt to present an unrealistic or inaccurate impression that was either more negative or more positive than the clinical picture would warrant. The patient's PAI clinical profile is marked by significant elevations, indicating the presence of clinical features that are likely to be sources of difficulty for the patient.  The configuration of the clinical scales suggests a person who is reporting significant distress, with particular concerns about her physical functioning.  The patient sees her life as severely disrupted by a variety of physical problems.  These problems have left her unhappy, with little energy or enthusiasm for concentrating on important life tasks and little hope for improvement in the future.  Her performance in important social roles has probably suffered as a result, and her lack of success in these  roles serves as an additional source of stress. The patient demonstrates an unusual degree of concern about physical functioning and health matters and probable impairment arising from somatic symptoms.  She is likely to report that her daily functioning has been compromised by numerous and varied physical problems.  She feels that her health is not as good as that of her age peers and  likely believes that her health problems are complex and difficult to treat successfully.  Physical complaints are likely to include symptoms of distress in several biological systems, including the neurological, gastrointestinal, and musculoskeletal systems.  The item endorsement pattern indicates that she reports symptoms consistent with both conversion and somatization disorders.  She is likely to be continuously concerned with her health status and physical problems.  Her social interactions and conversations tend to focus on her health problems, and her self-image may be largely influenced by a belief that she is handicapped by her poor health. The patient reports a number of difficulties consistent with a significant depressive experience.  Although she does not appear to feel hopeless and her self-esteem seems largely intact, she does manifest affective and physiological signs of depression.  She admits openly to feelings of sadness, a loss of interest in normal activities, and a loss of sense of pleasure in things that were previously enjoyed.  She is likely to show a disturbance in sleep pattern, a decrease in level of energy and sexual interest, and a loss of appetite and/or weight.  Psychomotor slowing might also be expected. The patient describes herself as rather moody and others may view her as overly sensitive.  She may be dissatisfied with her more important relationships and uncertain about major life goals. The patient describes her thought processes as marked by confusion, distractibility, and difficulty concentrating.  She may also have problems communicating clearly with other people because of speech that may tend to be tangential or circumstantial. According to the patient's self-report, she describes NO significant problems in the following areas: antisocial behavior; problems with empathy; undue suspiciousness or hostility; unusually elevated mood or heightened activity; marked anxiety;  problematic behaviors used to manage anxiety.  Also, she reports NO significant problems with alcohol or drug abuse or dependence.  With respect to suicidal ideation, the patient is NOT reporting distress from thoughts of self-harm.   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 Subjective cognitive complaints are likely related to medication effects and chronic pain.  There was evidence of global, mild attenuation of cognitive functions, not at a level that is considered a neurocognitive disorder. I suspect cognitive symptoms in daily life are related to chronic pain and associated mild depression, as well as medication effects (pain medication, Topamax). Fortunately I do not see any sign of underlying dementia or neurocognitive disorder. On psychological testing, she demonstrates emotional distress due to somatic symptoms. She does not appear clinically depressed, however, and there was no other indication of psychopathology present from this evaluation.    Recommendations/Plan: Based on the findings of the present evaluation, the following recommendations are offered:  1. Work with pain management on identifying alternative to opioid medication, if possible. 2. She may want to consider working with a psychologist specializing in chronic pain as well.    Feedback to Patient: JAELYNNE HOCKLEY completed a feedback appointment on 11/12/2017 to review the results of her neuropsychological evaluation with this provider. 15 minutes time was spent reviewing her test results, my impressions and my recommendations as detailed above.  Patient requested results be sent to her PCP and Nada Libman ([facility]).   Total time spent on this patient's case: 120 minutes for neurobehavioral status exam with psychologist (CPT code [phone], [phone] unit); 240 minutes of testing/scoring by psychometrician under psychologist's supervision  (CPT codes [phone], [phone] units); 220 minutes for integration of patient data, interpretation of standardized test results and clinical data, clinical decision making, treatment planning and preparation of this report, and interactive feedback with review of results to the patient/family by psychologist (CPT codes [phone], [phone] units).     Thank you for your referral of CHINITA SCHIMPF. Please feel free to contact me if you have any questions or concerns regarding this report.

## 2017-11-13 DIAGNOSIS — M25641 Stiffness of right hand, not elsewhere classified: Secondary | ICD-10-CM | POA: Diagnosis not present

## 2017-11-13 DIAGNOSIS — M25531 Pain in right wrist: Secondary | ICD-10-CM | POA: Diagnosis not present

## 2017-11-13 DIAGNOSIS — M25541 Pain in joints of right hand: Secondary | ICD-10-CM | POA: Diagnosis not present

## 2017-11-13 DIAGNOSIS — M6281 Muscle weakness (generalized): Secondary | ICD-10-CM | POA: Diagnosis not present

## 2017-11-14 DIAGNOSIS — M25531 Pain in right wrist: Secondary | ICD-10-CM | POA: Diagnosis not present

## 2017-11-14 DIAGNOSIS — M25541 Pain in joints of right hand: Secondary | ICD-10-CM | POA: Diagnosis not present

## 2017-11-14 DIAGNOSIS — M25641 Stiffness of right hand, not elsewhere classified: Secondary | ICD-10-CM | POA: Diagnosis not present

## 2017-11-14 DIAGNOSIS — M6281 Muscle weakness (generalized): Secondary | ICD-10-CM | POA: Diagnosis not present

## 2017-11-14 NOTE — Telephone Encounter (Signed)
Patient called to check on appointment, she said she is past the 2-3 week follow up Dr. Louanne Skye wanted her to have. I know we most likely have a 10:30 on 10/28 from a patient that couldn't change appt. Can we add her to that time slot?   Also, patient said she is being seen in a pain clinic and would like a note stating Dr. Louanne Skye had suggested/recommended CBD oil, she is taking it with THC in it .   Patients # [phone]

## 2017-11-15 NOTE — Telephone Encounter (Signed)
I called and advised that we do not have anything sooner for an appt at this time but that she is on the cancellation list.  I also advised that I could mail her the ov not where he did recommend the CBD oil or she could pick it up at the front desk. She asked that I mail it to her.

## 2017-11-18 DIAGNOSIS — M25531 Pain in right wrist: Secondary | ICD-10-CM | POA: Diagnosis not present

## 2017-11-18 DIAGNOSIS — M25641 Stiffness of right hand, not elsewhere classified: Secondary | ICD-10-CM | POA: Diagnosis not present

## 2017-11-18 DIAGNOSIS — M6281 Muscle weakness (generalized): Secondary | ICD-10-CM | POA: Diagnosis not present

## 2017-11-18 DIAGNOSIS — M25541 Pain in joints of right hand: Secondary | ICD-10-CM | POA: Diagnosis not present

## 2017-11-21 DIAGNOSIS — M25541 Pain in joints of right hand: Secondary | ICD-10-CM | POA: Diagnosis not present

## 2017-11-21 DIAGNOSIS — M6281 Muscle weakness (generalized): Secondary | ICD-10-CM | POA: Diagnosis not present

## 2017-11-21 DIAGNOSIS — M25641 Stiffness of right hand, not elsewhere classified: Secondary | ICD-10-CM | POA: Diagnosis not present

## 2017-11-21 DIAGNOSIS — M25531 Pain in right wrist: Secondary | ICD-10-CM | POA: Diagnosis not present

## 2017-11-25 DIAGNOSIS — M25531 Pain in right wrist: Secondary | ICD-10-CM | POA: Diagnosis not present

## 2017-11-25 DIAGNOSIS — M25541 Pain in joints of right hand: Secondary | ICD-10-CM | POA: Diagnosis not present

## 2017-11-25 DIAGNOSIS — M25641 Stiffness of right hand, not elsewhere classified: Secondary | ICD-10-CM | POA: Diagnosis not present

## 2017-11-25 DIAGNOSIS — M6281 Muscle weakness (generalized): Secondary | ICD-10-CM | POA: Diagnosis not present

## 2017-11-27 ENCOUNTER — Ambulatory Visit (INDEPENDENT_AMBULATORY_CARE_PROVIDER_SITE_OTHER): Payer: PPO | Admitting: Neurology

## 2017-11-27 ENCOUNTER — Other Ambulatory Visit: Payer: Self-pay | Admitting: Neurology

## 2017-11-27 ENCOUNTER — Encounter: Payer: Self-pay | Admitting: Neurology

## 2017-11-27 ENCOUNTER — Telehealth: Payer: Self-pay | Admitting: Neurology

## 2017-11-27 DIAGNOSIS — M79605 Pain in left leg: Secondary | ICD-10-CM

## 2017-11-27 DIAGNOSIS — M67441 Ganglion, right hand: Secondary | ICD-10-CM | POA: Diagnosis not present

## 2017-11-27 DIAGNOSIS — M1811 Unilateral primary osteoarthritis of first carpometacarpal joint, right hand: Secondary | ICD-10-CM | POA: Diagnosis not present

## 2017-11-27 NOTE — Procedures (Signed)
     HISTORY:  Debbie Pineda is a 56 year old patient with a history of chronic low back pain, the patient has developed discomfort down the left leg from the hip to the foot over the last several months.  The patient is being evaluated for these new symptoms.   NERVE CONDUCTION STUDIES:  Nerve conduction studies were performed on the left lower extremity. The distal motor latencies and motor amplitudes for the peroneal and posterior tibial nerves were within normal limits. The nerve conduction velocities for these nerves were also normal. The sensory latencies for the peroneal and sural nerves were within normal limits. The F wave latency for the posterior tibial nerve was within normal limits.  The H reflex latency was normal.   EMG STUDIES:  EMG study was performed on the left lower extremity:  The tibialis anterior muscle reveals 2 to 4K motor units with full recruitment. No fibrillations or positive waves were seen. The peroneus tertius muscle reveals 2 to 4K motor units with full recruitment. No fibrillations or positive waves were seen. The medial gastrocnemius muscle reveals 1 to 3K motor units with full recruitment. No fibrillations or positive waves were seen. The vastus lateralis muscle reveals 2 to 4K motor units with full recruitment. No fibrillations or positive waves were seen. The iliopsoas muscle reveals 2 to 4K motor units with full recruitment. No fibrillations or positive waves were seen. The biceps femoris muscle (long head) reveals 2 to 4K motor units with full recruitment. No fibrillations or positive waves were seen. The lumbosacral paraspinal muscles were tested at 3 levels, and revealed no abnormalities of insertional activity at all 3 levels tested. There was good relaxation.   IMPRESSION:  Nerve conduction studies done on the left lower extremity were unremarkable, no evidence of a neuropathy is seen.  EMG evaluation of the left lower extremity is  unremarkable, no evidence of an overlying lumbosacral radiculopathy is seen.  Jill Alexanders MD 11/27/2017 1:39 PM  [facility] [address] [place], [place] [address]  Phone [phone] Fax [phone]

## 2017-11-27 NOTE — Progress Notes (Signed)
[place]    Nerve / Sites Muscle Latency Ref. Amplitude Ref. Rel Amp Segments Distance Velocity Ref. Area    ms ms mV mV %  cm m/s m/s mVms  L Peroneal - EDB     Ankle EDB 3.6 ?6.5 6.9 ?2.0 100 Ankle - EDB 9   19.1     Fib head EDB 9.2  6.1  88.1 Fib head - Ankle 29 52 ?44 18.4     Pop fossa EDB 11.1  5.5  91.4 Pop fossa - Fib head 10 52 ?44 19.4         Pop fossa - Ankle      L Tibial - AH     Ankle AH 3.5 ?5.8 9.7 ?4.0 100 Ankle - AH 9   22.6     Pop fossa AH 11.6  7.1  73.8 Pop fossa - Ankle 35 44 ?41 18.6         SNC    Nerve / Sites Rec. Site Peak Lat Ref.  Amp Ref. Segments Distance    ms ms V V  cm  L Sural - Ankle (Calf)     Calf Ankle 3.3 ?4.4 7 ?6 Calf - Ankle 14  L Superficial peroneal - Ankle     Lat leg Ankle 3.7 ?4.4 8 ?6 Lat leg - Ankle 14         F  Wave    Nerve F Lat Ref.   ms ms  L Tibial - AH 49.5 ?56.0       H Reflex    Nerve H Lat Lat Hmax   ms ms   Left Right Ref. Left Right Ref.  Tibial - [place] 31.5 32.4 ?35.0   ?35.0

## 2017-11-27 NOTE — Progress Notes (Signed)
Office Visit Note  Patient: Debbie Pineda             Date of Birth: March 09, 1960           MRN: [id number]             PCP: Jake Samples, PA-C Referring: Jake Samples, [place]* Visit Date: 12/10/2017 Occupation: @GUAROCC @  Subjective:  Lower back pain.   History of Present Illness: Debbie Pineda is a 57 y.o. female with history of fibromyalgia, osteoarthritis and degenerative disc disease.  She states she had right [facility] surgery by Dr. Fredna Dow August.  She just finished occupational therapy.  She has been experiencing thoracic and lower back pain.  She states sometimes the pain radiates into her lower extremities.  She has been also having some discomfort in the left trochanteric bursa.  She continues to have some generalized pain from fibromyalgia.  She describes tightness in her trapezius muscles.  Activities of Daily Living:  Patient reports morning stiffness for several hours.   Patient Reports nocturnal pain.  Difficulty dressing/grooming: Denies Difficulty climbing stairs: Reports Difficulty getting out of chair: Reports Difficulty using hands for taps, buttons, cutlery, and/or writing: Denies  Review of Systems  Constitutional: Positive for fatigue. Negative for night sweats, weight gain and weight loss.  HENT: Positive for mouth dryness. Negative for mouth sores, trouble swallowing, trouble swallowing and nose dryness.   Eyes: Negative for pain, redness, visual disturbance and dryness.  Respiratory: Negative for cough, shortness of breath and difficulty breathing.   Cardiovascular: Negative for chest pain, palpitations, hypertension, irregular heartbeat and swelling in legs/feet.  Gastrointestinal: Positive for constipation. Negative for blood in stool and diarrhea.  Endocrine: Negative for increased urination.  Genitourinary: Negative for vaginal dryness.  Musculoskeletal: Positive for arthralgias, joint pain, myalgias, morning stiffness and myalgias. Negative  for joint swelling, muscle weakness and muscle tenderness.  Skin: Negative for color change, rash, hair loss, skin tightness, ulcers and sensitivity to sunlight.  Allergic/Immunologic: Negative for susceptible to infections.  Neurological: Negative for dizziness, memory loss, night sweats and weakness.  Hematological: Negative for swollen glands.  Psychiatric/Behavioral: Positive for sleep disturbance. Negative for depressed mood. The patient is not nervous/anxious.     PMFS History:  Patient Active Problem List   Diagnosis Date Noted  . Elevated liver enzymes 09/10/2017  . History of diabetes mellitus 06/06/2017  . Primary osteoarthritis of right hand 06/06/2017  . Transaminasemia   . TIA (transient ischemic attack) 05/01/2017  . Dysphasia 05/01/2017  . Chronic pain syndrome 05/01/2017  . Confusion   . Presence of right artificial knee joint 09/17/2016  . H/O total knee replacement, right 08/15/2016  . Presence of retained hardware   . Memory disorder 08/14/2016  . Cerebrovascular disease 08/14/2016  . Unilateral primary osteoarthritis, right knee 05/29/2016  . Primary osteoarthritis of both feet 05/29/2016  . Trochanteric bursitis of both hips 05/25/2016  . Neck pain 05/25/2016  . Urinary urgency 04/19/2016  . Chronic low back pain 01/11/2016  . Depression 11/29/2015  . 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 Heart palpitations 12/14/2013  . Generalized osteoarthritis of multiple sites 11/04/2013  . Cerebral infarction ([place]) 10/30/2011  . PFO (patent foramen ovale) 10/30/2011  . Bradycardia 10/30/2011  . Chest pain 10/30/2011  . Hypothyroid   . Thrombophlebitis   . Sleep apnea   . Fibromyalgia   . 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 Status post bariatric surgery 12/07/2010  . Vein disorder 11/29/2010  .  S/P total hysterectomy and bilateral salpingo-oophorectomy 11/29/2010  . S/P exploratory laparotomy 11/29/2010  . S/P cholecystectomy 11/29/2010    . S/P ACL surgery 11/29/2010  . Morbid obesity ([place]) 11/10/2010    Past Medical History:  Diagnosis Date  . ADD (attention deficit disorder)    takes Adderall daily  . Arthritis    "all over"  . Cerebral infarction ([place]) 10/30/2011  . Cerebrovascular disease 08/14/2016  . Chronic back pain    "all over"  . Chronic low back pain 01/11/2016  . Complication of anesthesia    tends to have hypotension when NPO and post-anesthesia  . Constipation    takes stool softener daily  . Degenerative disk disease   . Degenerative joint disease   . Depression    takes Cymbalta daily for pain per pt  . Diabetes mellitus without complication ([place])   . DVT (deep venous thrombosis) ([facility])    RLE  . Family history of adverse reaction to anesthesia    a family member woke up during surgery; "think it was my mom"  . Fibromyalgia   . Generalized osteoarthritis of multiple sites 11/04/2013  . GERD (gastroesophageal reflux disease)   . Heart palpitations 12/14/2013  . History of blood clots    superficial  . Hypoglycemia   . Hypothyroid    takes Synthroid daily  . Incomplete emptying of bladder   . Insomnia    takes Trazodone nightly  . Iron deficiency anemia    takes Ferrous Sulfate daily  . Joint pain   . Joint swelling    knees and ankles  . Memory disorder 08/14/2016  . Morbid obesity ([place])   . Nausea    takes Zofran as needed.Seeing GI doc  . Neck pain 05/25/2016  . OSA on CPAP    tested more than 5 yrs ago.    . Osteoarthritis   . PFO (patent foramen ovale)   . Primary osteoarthritis of both feet 05/29/2016   Right bunionectomy August 2017 by Dr. Sharol Given  . Scoliosis   . Sleep apnea   . Stroke [facility]) "several"   right foot weakness; memory issues, black spot right visual field since" (03/23/2015)  . Thrombophlebitis   . Trochanteric bursitis of both hips 05/25/2016  . Unilateral primary osteoarthritis, right knee 05/29/2016  . Urinary urgency 04/19/2016  . Vein disorder 11/29/2010     Family History  Problem Relation Age of Onset  . Heart disease Father   . Cancer Father   . Parkinson's disease Father   . Cancer Mother        skin cancer   . Myasthenia gravis Mother   . Heart disease Brother   . Cancer Brother   . Diabetes Brother   . Stroke Brother   . Heart disease Sister   . Heart attack Sister   . Cancer Maternal Grandfather   . Hypothyroidism Daughter   . Hypertension Other   . Colon cancer Neg Hx    Past Surgical History:  Procedure Laterality Date  . BONE EXCISION Right 08/29/2017   Procedure: right trapezium excision;  Surgeon: Daryll Brod, MD;  Location: [place];  Service: Orthopedics;  Laterality: Right;  . BUNIONECTOMY Right 08/2015  . CARDIAC CATHETERIZATION     2008.  "it was fine" (not sure why she had it done, and doesn't know where)  . CARPOMETACARPEL SUSPENSION PLASTY Right 08/29/2017   Procedure: SUSPENSION PLASTY RIGHT THUMB;  Surgeon: Daryll Brod, MD;  Location: [place];  Service: Orthopedics;  Laterality: Right;  . COLONOSCOPY N/A 03/25/2013   Procedure: COLONOSCOPY;  Surgeon: Rogene Houston, MD;  [facility];  Service: Endoscopy;  Laterality: N/A;  930  . ESOPHAGOGASTRODUODENOSCOPY    . EXPLORATORY LAPAROTOMY     "took fallopian tubes out"  . JOINT REPLACEMENT     bil knee   . KNEE ARTHROPLASTY    . KNEE ARTHROSCOPY Left   . KNEE ARTHROSCOPY W/ ACL RECONSTRUCTION Right    "added pins"  . LAPAROSCOPIC CHOLECYSTECTOMY  ~ 2001  . 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 SPINAL CORD STIMULATOR INSERTION N/A 04/18/2017   Procedure: LUMBAR SPINAL CORD STIMULATOR INSERTION;  Surgeon: Clydell Hakim, MD;  Location: [place];  Service: Neurosurgery;  Laterality: N/A;  LUMBAR SPINAL CORD STIMULATOR INSERTION  . TENDON TRANSFER Right 08/29/2017   Procedure: right abductor pollicis longus transfer;  Surgeon: Daryll Brod, MD;  Location: [place];  Service: Orthopedics;  Laterality: Right;   . TOTAL KNEE ARTHROPLASTY Left 03/23/2015   Procedure: TOTAL KNEE ARTHROPLASTY;  Surgeon: Newt Minion, MD;  Location: [place];  Service: Orthopedics;  Laterality: Left;  . TOTAL KNEE ARTHROPLASTY Right 08/15/2016   Procedure: RIGHT TOTAL KNEE ARTHROPLASTY, REMOVAL ACL SCREWS;  Surgeon: Newt Minion, MD;  Location: [place];  Service: Orthopedics;  Laterality: Right;  . TOTAL KNEE ARTHROPLASTY WITH HARDWARE REMOVAL Right   . VAGINAL HYSTERECTOMY    . VARICOSE VEIN SURGERY Right X 2   Social History   Social History Narrative   Lives with husband   Caffeine use: No soda   Mainly water, drinks decaf tea   Right handed    Objective: Vital Signs: BP 113/69 (BP Location: Left Arm, Patient Position: Sitting, Cuff Size: Large)   Pulse 71   Resp 15   Ht 5\' 5"  (1.651 m)   Wt 215 lb 12.8 oz (97.9 kg)   BMI 35.91 kg/m    Physical Exam  Constitutional: She is oriented to person, place, and time. She appears well-developed and well-nourished.  HENT:  Head: Normocephalic and atraumatic.  Eyes: Conjunctivae and EOM are normal.  Neck: Normal range of motion.  Cardiovascular: Normal rate, regular rhythm, normal heart sounds and intact distal pulses.  Pulmonary/Chest: Effort normal and breath sounds normal.  Abdominal: Soft. Bowel sounds are normal.  Lymphadenopathy:    She has no cervical adenopathy.  Neurological: She is alert and oriented to person, place, and time.  Skin: Skin is warm and dry. Capillary refill takes less than 2 seconds.  Psychiatric: She has a normal mood and affect. Her behavior is normal.  Nursing note and vitals reviewed.    Musculoskeletal Exam: C-spine good range of motion.  She has some thoracic lumbar discomfort with range of motion.  Shoulder joints elbow joints wrist joint MCPs PIPs DIPs been good range of motion.  She has bilateral total knee replacement.  She has tenderness over left trochanteric bursa.  She had some DIP and PIP thickening in her hands and feet  without any synovitis.  She is generalized hyperalgesia with bilateral trapezius spasm.  CDAI Exam: CDAI Score: Not documented Patient Global Assessment: Not documented; Provider Global Assessment: Not documented Swollen: Not documented; Tender: Not documented Joint Exam   Not documented   There is currently no information documented on the homunculus. Go to the Rheumatology activity and complete the homunculus joint exam.  Investigation: No additional findings.  Imaging: No results found.  Recent Labs: Lab Results  Component Value Date   WBC 5.0 07/26/2017   HGB 11.9 (L) 07/26/2017   PLT 186 07/26/2017   NA 141 07/26/2017   K 4.0 07/26/2017   CL 110 07/26/2017   CO2 25 07/26/2017   GLUCOSE 107 (H) 07/26/2017   BUN 17 07/26/2017   CREATININE 0.87 07/26/2017   BILITOT 0.3 09/10/2017   ALKPHOS 127 (H) 07/26/2017   AST 24 09/10/2017   ALT 33 (H) 09/10/2017   PROT 6.3 09/10/2017   ALBUMIN 3.5 07/26/2017   CALCIUM 8.8 (L) 07/26/2017   GFRAA >60 07/26/2017    Speciality Comments: No specialty comments available.  Procedures:  Trigger Point Inj Date/Time: 12/10/2017 11:06 AM Performed by: Bo Merino, MD Authorized by: Bo Merino, MD   Consent Given by:  Patient Site marked: the procedure site was marked   Timeout: prior to procedure the correct patient, procedure, and site was verified   Indications:  Muscle spasm and pain Total # of Trigger Points:  2 Location: neck   Needle Size:  27 G Approach:  Dorsal Medications #1:  0.5 mL lidocaine 1 %; 10 mg triamcinolone acetonide 40 MG/ML Medications #2:  0.5 mL lidocaine 1 %; 10 mg triamcinolone acetonide 40 MG/ML Patient tolerance:  Patient tolerated the procedure well with no immediate complications   Allergies: Lyrica [pregabalin]; Flexeril [cyclobenzaprine hcl]; Morphine and related; Sulfamethoxazole-trimethoprim; Belsomra [suvorexant]; and Tape   Assessment / Plan:     Visit Diagnoses:  Fibromyalgia-she continues to have some generalized pain and discomfort.  She has positive tender points.  Neck pain-she has bilateral trapezius spasm and discomfort.  Per her request bilateral trapezius area were injected with cortisone as described above.  A handout on neck exercises was given.  I offered physical therapy which she declined.  Primary osteoarthritis of right hand-joint protection muscle strengthening was discussed.  She had recent right [facility] surgery by Dr. Fredna Dow.  She just finished occupational therapy.  Trochanteric bursitis of both hips-IT band stretching was discussed.  History of total bilateral knee replacement-doing well.  Primary osteoarthritis of both feet - Right bunionectomy August 2017 by Dr. Sharol Given   Chronic midline low back pain without sciatica - She's been followed by Dr. Louanne Skye..  She had recent a stimulator placed.  Patient reports that she will schedule follow-up appointment with Dr. Maryjean Ka as her stimulator is not working and causing increased pain.  Other medical problems are listed as follows:  PFO (patent foramen ovale)  History of diabetes mellitus  History of cerebral infarction  History of thrombophlebitis  History of sleep apnea  History of gastric bypass - 11/20/2010    Orders: Orders Placed This Encounter  Procedures  . Trigger Point Inj   No orders of the defined types were placed in this encounter.   Face-to-face time spent with patient was 30 minutes. Greater than 50% of time was spent in counseling and coordination of care.  Follow-Up Instructions: Return in about 6 months (around 06/10/2018) for FMS, OA, DDD.   Bo Merino, MD  Note - This record has been created using Editor, commissioning.  Chart creation errors have been sought, but may not always  have been located. Such creation errors do not reflect on  the standard of medical care.

## 2017-11-27 NOTE — Telephone Encounter (Signed)
The patient came in for EMG and nerve conduction study evaluation today.  She claims that on Ajovy her headaches have gone down to a frequency of about 1 month.  I have indicated that she may try to taper off the Topamax, she may go down by 50 mg every 2 weeks until she is off the medication.  She will call if the headaches worsen.

## 2017-11-27 NOTE — Progress Notes (Signed)
Please refer to EMG and nerve conduction procedure note.  

## 2017-11-28 DIAGNOSIS — M961 Postlaminectomy syndrome, not elsewhere classified: Secondary | ICD-10-CM | POA: Diagnosis not present

## 2017-11-28 DIAGNOSIS — M797 Fibromyalgia: Secondary | ICD-10-CM | POA: Diagnosis not present

## 2017-11-28 DIAGNOSIS — G894 Chronic pain syndrome: Secondary | ICD-10-CM | POA: Diagnosis not present

## 2017-11-28 DIAGNOSIS — M792 Neuralgia and neuritis, unspecified: Secondary | ICD-10-CM | POA: Diagnosis not present

## 2017-12-02 DIAGNOSIS — M25641 Stiffness of right hand, not elsewhere classified: Secondary | ICD-10-CM | POA: Diagnosis not present

## 2017-12-02 DIAGNOSIS — M25531 Pain in right wrist: Secondary | ICD-10-CM | POA: Diagnosis not present

## 2017-12-02 DIAGNOSIS — M25541 Pain in joints of right hand: Secondary | ICD-10-CM | POA: Diagnosis not present

## 2017-12-02 DIAGNOSIS — M6281 Muscle weakness (generalized): Secondary | ICD-10-CM | POA: Diagnosis not present

## 2017-12-05 ENCOUNTER — Ambulatory Visit (INDEPENDENT_AMBULATORY_CARE_PROVIDER_SITE_OTHER): Payer: PPO | Admitting: Specialist

## 2017-12-05 ENCOUNTER — Encounter (INDEPENDENT_AMBULATORY_CARE_PROVIDER_SITE_OTHER): Payer: Self-pay | Admitting: Specialist

## 2017-12-05 VITALS — BP 126/74 | HR 116 | Ht 65.0 in | Wt 210.0 lb

## 2017-12-05 DIAGNOSIS — M25552 Pain in left hip: Secondary | ICD-10-CM | POA: Diagnosis not present

## 2017-12-05 NOTE — Progress Notes (Signed)
Office Visit Note   Patient: Debbie Pineda           Date of Birth: 10-Jan-1961           MRN: [id number] Visit Date: 12/05/2017              Requested by: Jake Samples, PA-C [address] [place], [place] [address] PCP: Jake Samples, PA-C   Assessment & Plan: Visit Diagnoses:  1. Pain in left hip     Plan: The main ways of treat osteoarthritis, that are found to be success. Weight loss helps to decrease pain. Exercise is important to maintaining cartilage and thickness and strengthening. NSAIDs like motrin, tylenol, alleve are meds decreasing the inflamation. Ice is okay  In afternoon and evening and hot shower in the am   Follow-Up Instructions: Return in about 2 months (around 02/04/2018).   Orders:  No orders of the defined types were placed in this encounter.  No orders of the defined types were placed in this encounter.     Procedures: No procedures performed   Clinical Data: No additional findings.   Subjective: Chief Complaint  Patient presents with  . Left Hip - Follow-up  . Lower Back - Follow-up    HPI  Review of Systems   Objective: Vital Signs: BP 126/74 (BP Location: Left Arm, Patient Position: Sitting)   Pulse (!) 116   Ht 5\' 5"  (1.651 m)   Wt 210 lb (95.3 kg)   BMI 34.95 kg/m   Physical Exam  Ortho Exam  Specialty Comments:  No specialty comments available.  Imaging: No results found.   PMFS History: Patient Active Problem List   Diagnosis Date Noted  . Elevated liver enzymes 09/10/2017  . History of diabetes mellitus 06/06/2017  . Primary osteoarthritis of right hand 06/06/2017  . Transaminasemia   . TIA (transient ischemic attack) 05/01/2017  . Dysphasia 05/01/2017  . Chronic pain syndrome 05/01/2017  . Confusion   . Presence of right artificial knee joint 09/17/2016  . H/O total knee replacement, right 08/15/2016  . Presence of retained hardware   . Memory disorder 08/14/2016  .  Cerebrovascular disease 08/14/2016  . Unilateral primary osteoarthritis, right knee 05/29/2016  . Primary osteoarthritis of both feet 05/29/2016  . Trochanteric bursitis of both hips 05/25/2016  . Neck pain 05/25/2016  . Urinary urgency 04/19/2016  . Chronic low back pain 01/11/2016  . Depression 11/29/2015  . 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 Heart palpitations 12/14/2013  . Generalized osteoarthritis of multiple sites 11/04/2013  . Cerebral infarction ([place]) 10/30/2011  . PFO (patent foramen ovale) 10/30/2011  . Bradycardia 10/30/2011  . Chest pain 10/30/2011  . Hypothyroid   . Thrombophlebitis   . Sleep apnea   . Fibromyalgia   . 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 Status post bariatric surgery 12/07/2010  . Vein disorder 11/29/2010  . S/P total hysterectomy and bilateral salpingo-oophorectomy 11/29/2010  . S/P exploratory laparotomy 11/29/2010  . S/P cholecystectomy 11/29/2010  . S/P ACL surgery 11/29/2010  . Morbid obesity ([facility]) 11/10/2010   Past Medical History:  Diagnosis Date  . ADD (attention deficit disorder)    takes Adderall daily  . Arthritis    "all over"  . Cerebral infarction ([facility]) 10/30/2011  . Cerebrovascular disease 08/14/2016  . Chronic back pain    "all over"  . Chronic low back pain 01/11/2016  . Complication of anesthesia    tends to have hypotension when NPO and  post-anesthesia  . Constipation    takes stool softener daily  . Degenerative disk disease   . Degenerative joint disease   . Depression    takes Cymbalta daily for pain per pt  . Diabetes mellitus without complication ([facility])   . DVT (deep venous thrombosis) ([facility])    RLE  . Family history of adverse reaction to anesthesia    a family member woke up during surgery; "think it was my mom"  . Fibromyalgia   . Generalized osteoarthritis of multiple sites 11/04/2013  . GERD (gastroesophageal reflux disease)   . Heart palpitations 12/14/2013  . History of blood  clots    superficial  . Hypoglycemia   . Hypothyroid    takes Synthroid daily  . Incomplete emptying of bladder   . Insomnia    takes Trazodone nightly  . Iron deficiency anemia    takes Ferrous Sulfate daily  . Joint pain   . Joint swelling    knees and ankles  . Memory disorder 08/14/2016  . Morbid obesity ([facility] [facility])   . Nausea    takes Zofran as needed.Seeing GI doc  . Neck pain 05/25/2016  . OSA on CPAP    tested more than 5 yrs ago.    . Osteoarthritis   . PFO (patent foramen ovale)   . Primary osteoarthritis of both feet 05/29/2016   Right bunionectomy August 2017 by Dr. Sharol Given  . Scoliosis   . Sleep apnea   . Stroke [facility]) "several"   right foot weakness; memory issues, black spot right visual field since" (03/23/2015)  . Thrombophlebitis   . Trochanteric bursitis of both hips 05/25/2016  . Unilateral primary osteoarthritis, right knee 05/29/2016  . Urinary urgency 04/19/2016  . Vein disorder 11/29/2010    Family History  Problem Relation Age of Onset  . Heart disease Father   . Cancer Father   . Cancer Mother        skin cancer   . Heart disease Brother   . Cancer Brother   . Diabetes Brother   . Stroke Brother   . Heart disease Sister   . Cancer Maternal Grandfather   . Hypothyroidism Daughter   . Hypertension Other   . Colon cancer Neg Hx     Past Surgical History:  Procedure Laterality Date  . BONE EXCISION Right 08/29/2017   Procedure: right trapezium excision;  Surgeon: Daryll Brod, MD;  Location: [facility];  Service: Orthopedics;  Laterality: Right;  . BUNIONECTOMY Right 08/2015  . CARDIAC CATHETERIZATION     2008.  "it was fine" (not sure why she had it done, and doesn't know where)  . CARPOMETACARPEL SUSPENSION PLASTY Right 08/29/2017   Procedure: SUSPENSION PLASTY RIGHT THUMB;  Surgeon: Daryll Brod, MD;  Location: [facility];  Service: Orthopedics;  Laterality: Right;  . COLONOSCOPY N/A 03/25/2013   Procedure: COLONOSCOPY;  Surgeon:  Rogene Houston, MD;  [facility];  Service: Endoscopy;  Laterality: N/A;  930  . ESOPHAGOGASTRODUODENOSCOPY    . EXPLORATORY LAPAROTOMY     "took fallopian tubes out"  . JOINT REPLACEMENT     bil knee   . KNEE ARTHROPLASTY    . KNEE ARTHROSCOPY Left   . KNEE ARTHROSCOPY W/ ACL RECONSTRUCTION Right    "added pins"  . LAPAROSCOPIC CHOLECYSTECTOMY  ~ 2001  . 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 SPINAL CORD STIMULATOR INSERTION N/A 04/18/2017   Procedure: LUMBAR SPINAL CORD STIMULATOR INSERTION;  Surgeon: Maryjean Ka,  Eddie Dibbles, MD;  Location: [facility];  Service: Neurosurgery;  Laterality: N/A;  LUMBAR SPINAL CORD STIMULATOR INSERTION  . TENDON TRANSFER Right 08/29/2017   Procedure: right abductor pollicis longus transfer;  Surgeon: Daryll Brod, MD;  Location: [facility];  Service: Orthopedics;  Laterality: Right;  . TOTAL KNEE ARTHROPLASTY Left 03/23/2015   Procedure: TOTAL KNEE ARTHROPLASTY;  Surgeon: Newt Minion, MD;  Location: [facility];  Service: Orthopedics;  Laterality: Left;  . TOTAL KNEE ARTHROPLASTY Right 08/15/2016   Procedure: RIGHT TOTAL KNEE ARTHROPLASTY, REMOVAL ACL SCREWS;  Surgeon: Newt Minion, MD;  Location: [facility];  Service: Orthopedics;  Laterality: Right;  . TOTAL KNEE ARTHROPLASTY WITH HARDWARE REMOVAL Right   . VAGINAL HYSTERECTOMY    . VARICOSE VEIN SURGERY Right X 2   Social History   Occupational History  . Occupation: Disability  . Occupation: formerly Therapist, sports, [employer]  Tobacco Use  . Smoking status: Former Smoker    Packs/day: 0.75    Years: 8.00    Pack years: 6.00    Types: Cigarettes    Last attempt to quit: 12/01/1990    Years since quitting: 27.0  . Smokeless tobacco: Never Used  . Tobacco comment: quit smoking in the 1990s  Substance and Sexual Activity  . Alcohol use: No    Comment: 03/23/2015 "stopped drinking in 2012 w/gastric bypass; drank socially before bypass"  . Drug use: No  . Sexual activity: Not Currently    Birth  control/protection: Surgical

## 2017-12-05 NOTE — Patient Instructions (Signed)
The main ways of treat osteoarthritis, that are found to be success. Weight loss helps to decrease pain. Exercise is important to maintaining cartilage and thickness and strengthening. NSAIDs like motrin, tylenol, alleve are meds decreasing the inflamation. Ice is okay  In afternoon and evening and hot shower in the am

## 2017-12-10 ENCOUNTER — Encounter: Payer: Self-pay | Admitting: Rheumatology

## 2017-12-10 ENCOUNTER — Ambulatory Visit: Payer: PPO | Admitting: Rheumatology

## 2017-12-10 VITALS — BP 113/69 | HR 71 | Resp 15 | Ht 65.0 in | Wt 215.8 lb

## 2017-12-10 DIAGNOSIS — M7061 Trochanteric bursitis, right hip: Secondary | ICD-10-CM | POA: Diagnosis not present

## 2017-12-10 DIAGNOSIS — M542 Cervicalgia: Secondary | ICD-10-CM | POA: Diagnosis not present

## 2017-12-10 DIAGNOSIS — Z96653 Presence of artificial knee joint, bilateral: Secondary | ICD-10-CM | POA: Diagnosis not present

## 2017-12-10 DIAGNOSIS — M19072 Primary osteoarthritis, left ankle and foot: Secondary | ICD-10-CM

## 2017-12-10 DIAGNOSIS — Q2112 Patent foramen ovale: Secondary | ICD-10-CM

## 2017-12-10 DIAGNOSIS — M19071 Primary osteoarthritis, right ankle and foot: Secondary | ICD-10-CM | POA: Diagnosis not present

## 2017-12-10 DIAGNOSIS — M545 Low back pain, unspecified: Secondary | ICD-10-CM

## 2017-12-10 DIAGNOSIS — Z9884 Bariatric surgery status: Secondary | ICD-10-CM

## 2017-12-10 DIAGNOSIS — Z8673 Personal history of transient ischemic attack (TIA), and cerebral infarction without residual deficits: Secondary | ICD-10-CM | POA: Diagnosis not present

## 2017-12-10 DIAGNOSIS — M7062 Trochanteric bursitis, left hip: Secondary | ICD-10-CM

## 2017-12-10 DIAGNOSIS — Q211 Atrial septal defect: Secondary | ICD-10-CM | POA: Diagnosis not present

## 2017-12-10 DIAGNOSIS — Z8639 Personal history of other endocrine, nutritional and metabolic disease: Secondary | ICD-10-CM

## 2017-12-10 DIAGNOSIS — Z8672 Personal history of thrombophlebitis: Secondary | ICD-10-CM | POA: Diagnosis not present

## 2017-12-10 DIAGNOSIS — Z8669 Personal history of other diseases of the nervous system and sense organs: Secondary | ICD-10-CM | POA: Diagnosis not present

## 2017-12-10 DIAGNOSIS — M19041 Primary osteoarthritis, right hand: Secondary | ICD-10-CM | POA: Diagnosis not present

## 2017-12-10 DIAGNOSIS — M797 Fibromyalgia: Secondary | ICD-10-CM | POA: Diagnosis not present

## 2017-12-10 DIAGNOSIS — G8929 Other chronic pain: Secondary | ICD-10-CM

## 2017-12-10 MED ORDER — LIDOCAINE HCL 1 % IJ SOLN
0.5000 mL | INTRAMUSCULAR | Status: AC | PRN
  Administered 2017-12-10: .5 mL

## 2017-12-10 MED ORDER — TRIAMCINOLONE ACETONIDE 40 MG/ML IJ SUSP
10.0000 mg | INTRAMUSCULAR | Status: AC | PRN
  Administered 2017-12-10: 10 mg via INTRAMUSCULAR

## 2017-12-10 NOTE — Patient Instructions (Signed)
Cervical Strain and Sprain Rehab Ask your health care provider which exercises are safe for you. Do exercises exactly as told by your health care provider and adjust them as directed. It is normal to feel mild stretching, pulling, tightness, or discomfort as you do these exercises, but you should stop right away if you feel sudden pain or your pain gets worse.Do not begin these exercises until told by your health care provider. Stretching and range of motion exercises These exercises warm up your muscles and joints and improve the movement and flexibility of your neck. These exercises also help to relieve pain, numbness, and tingling. Exercise A: Cervical side bend  1. Using good posture, sit on a stable chair or stand up. 2. Without moving your shoulders, slowly tilt your left / right ear to your shoulder until you feel a stretch in your neck muscles. You should be looking straight ahead. 3. Hold for __________ seconds. 4. Repeat with the other side of your neck. Repeat __________ times. Complete this exercise __________ times a day. Exercise B: Cervical rotation  1. Using good posture, sit on a stable chair or stand up. 2. Slowly turn your head to the side as if you are looking over your left / right shoulder. ? Keep your eyes level with the ground. ? Stop when you feel a stretch along the side and the back of your neck. 3. Hold for __________ seconds. 4. Repeat this by turning to your other side. Repeat __________ times. Complete this exercise __________ times a day. Exercise C: Thoracic extension and pectoral stretch 1. Roll a towel or a small blanket so it is about 4 inches (10 cm) in diameter. 2. Lie down on your back on a firm surface. 3. Put the towel lengthwise, under your spine in the middle of your back. It should not be not under your shoulder blades. The towel should line up with your spine from your middle back to your lower back. 4. Put your hands behind your head and let your  elbows fall out to your sides. 5. Hold for __________ seconds. Repeat __________ times. Complete this exercise __________ times a day. Strengthening exercises These exercises build strength and endurance in your neck. Endurance is the ability to use your muscles for a long time, even after your muscles get tired. Exercise D: Upper cervical flexion, isometric 1. Lie on your back with a thin pillow behind your head and a small rolled-up towel under your neck. 2. Gently tuck your chin toward your chest and nod your head down to look toward your feet. Do not lift your head off the pillow. 3. Hold for __________ seconds. 4. Release the tension slowly. Relax your neck muscles completely before you repeat this exercise. Repeat __________ times. Complete this exercise __________ times a day. Exercise E: Cervical extension, isometric  1. Stand about 6 inches (15 cm) away from a wall, with your back facing the wall. 2. Place a soft object, about 6-8 inches (15-20 cm) in diameter, between the back of your head and the wall. A soft object could be a small pillow, a ball, or a folded towel. 3. Gently tilt your head back and press into the soft object. Keep your jaw and forehead relaxed. 4. Hold for __________ seconds. 5. Release the tension slowly. Relax your neck muscles completely before you repeat this exercise. Repeat __________ times. Complete this exercise __________ times a day. Posture and body mechanics  Body mechanics refers to the movements and positions of   your body while you do your daily activities. Posture is part of body mechanics. Good posture and healthy body mechanics can help to relieve stress in your body's tissues and joints. Good posture means that your spine is in its natural S-curve position (your spine is neutral), your shoulders are pulled back slightly, and your head is not tipped forward. The following are general guidelines for applying improved posture and body mechanics to  your everyday activities. Standing  When standing, keep your spine neutral and keep your feet about hip-width apart. Keep a slight bend in your knees. Your ears, shoulders, and hips should line up.  When you do a task in which you stand in one place for a long time, place one foot up on a stable object that is 2-4 inches (5-10 cm) high, such as a footstool. This helps keep your spine neutral. Sitting   When sitting, keep your spine neutral and your keep feet flat on the floor. Use a footrest, if necessary, and keep your thighs parallel to the floor. Avoid rounding your shoulders, and avoid tilting your head forward.  When working at a desk or a computer, keep your desk at a height where your hands are slightly lower than your elbows. Slide your chair under your desk so you are close enough to maintain good posture.  When working at a computer, place your monitor at a height where you are looking straight ahead and you do not have to tilt your head forward or downward to look at the screen. Resting When lying down and resting, avoid positions that are most painful for you. Try to support your neck in a neutral position. You can use a contour pillow or a small rolled-up towel. Your pillow should support your neck but not push on it. This information is not intended to replace advice given to you by your health care provider. Make sure you discuss any questions you have with your health care provider. Document Released: 01/08/2005 Document Revised: 09/15/2015 Document Reviewed: 12/15/2014 [employer] Interactive Patient Education  2018 [employer]  

## 2017-12-12 ENCOUNTER — Other Ambulatory Visit (HOSPITAL_COMMUNITY): Payer: Self-pay | Admitting: Family Medicine

## 2017-12-12 DIAGNOSIS — E119 Type 2 diabetes mellitus without complications: Secondary | ICD-10-CM | POA: Diagnosis not present

## 2017-12-12 DIAGNOSIS — E063 Autoimmune thyroiditis: Secondary | ICD-10-CM | POA: Diagnosis not present

## 2017-12-12 DIAGNOSIS — Z7901 Long term (current) use of anticoagulants: Secondary | ICD-10-CM | POA: Diagnosis not present

## 2017-12-12 DIAGNOSIS — M791 Myalgia, unspecified site: Secondary | ICD-10-CM | POA: Diagnosis not present

## 2017-12-12 DIAGNOSIS — I671 Cerebral aneurysm, nonruptured: Secondary | ICD-10-CM | POA: Diagnosis not present

## 2017-12-12 DIAGNOSIS — Z1231 Encounter for screening mammogram for malignant neoplasm of breast: Secondary | ICD-10-CM

## 2017-12-12 DIAGNOSIS — Z681 Body mass index (BMI) 19 or less, adult: Secondary | ICD-10-CM | POA: Diagnosis not present

## 2017-12-12 DIAGNOSIS — Z23 Encounter for immunization: Secondary | ICD-10-CM | POA: Diagnosis not present

## 2017-12-12 DIAGNOSIS — G479 Sleep disorder, unspecified: Secondary | ICD-10-CM | POA: Diagnosis not present

## 2017-12-12 DIAGNOSIS — Z0000 Encounter for general adult medical examination without abnormal findings: Secondary | ICD-10-CM | POA: Diagnosis not present

## 2017-12-16 ENCOUNTER — Ambulatory Visit (HOSPITAL_COMMUNITY)
Admission: RE | Admit: 2017-12-16 | Discharge: 2017-12-16 | Disposition: A | Discharge status: Home / Self Care | Payer: PPO | Source: Ambulatory Visit | Attending: Family Medicine | Admitting: Family Medicine

## 2017-12-16 DIAGNOSIS — Z1231 Encounter for screening mammogram for malignant neoplasm of breast: Secondary | ICD-10-CM | POA: Diagnosis not present

## 2017-12-24 ENCOUNTER — Ambulatory Visit: Payer: PPO | Admitting: Physician Assistant

## 2017-12-24 ENCOUNTER — Encounter: Payer: Self-pay | Admitting: Physician Assistant

## 2017-12-24 DIAGNOSIS — F3340 Major depressive disorder, recurrent, in remission, unspecified: Secondary | ICD-10-CM | POA: Diagnosis not present

## 2017-12-24 DIAGNOSIS — R413 Other amnesia: Secondary | ICD-10-CM | POA: Diagnosis not present

## 2017-12-24 DIAGNOSIS — G4700 Insomnia, unspecified: Secondary | ICD-10-CM

## 2017-12-24 DIAGNOSIS — Z79899 Other long term (current) drug therapy: Secondary | ICD-10-CM

## 2017-12-24 MED ORDER — METHYLPHENIDATE HCL 10 MG PO TABS: 10.0000 mg | ORAL_TABLET | Freq: Two times a day (BID) | ORAL | 0 refills | Status: DC
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
## 2017-12-25 DIAGNOSIS — M5136 Other intervertebral disc degeneration, lumbar region: Secondary | ICD-10-CM | POA: Diagnosis not present

## 2017-12-25 DIAGNOSIS — M47816 Spondylosis without myelopathy or radiculopathy, lumbar region: Secondary | ICD-10-CM | POA: Diagnosis not present

## 2017-12-25 DIAGNOSIS — M792 Neuralgia and neuritis, unspecified: Secondary | ICD-10-CM | POA: Diagnosis not present

## 2017-12-25 DIAGNOSIS — M797 Fibromyalgia: Secondary | ICD-10-CM | POA: Diagnosis not present

## 2017-12-27 ENCOUNTER — Telehealth: Payer: Self-pay | Admitting: Physician Assistant

## 2017-12-27 NOTE — Telephone Encounter (Signed)
We can do referral. I'll CC Leda Gauze, I think she does them, right? Referral to Dr. Brett Fairy for chronic insomnia and known sleep apnea, using CPAP.

## 2017-12-27 NOTE — Telephone Encounter (Signed)
-----   Message from [name], MD sent at 12/27/2017  7:49 AM EST ----- The use of trazodone is probably a better option for chronic insomnia than other benzodiazepine sleeping aids.  We have a sleep center here, in general they do not evaluate chronic insomnia, they all more focused on excessive daytime drowsiness issues unassociated with insomnia.  Oftentimes, insomnia is related to chronic pain issues or psychiatric issues, treating underlying problem is a solution to the insomnia.  If there are concerns about sleep apnea, I am sure 1 of our sleep physicians would be happy to see her. ----- Message ----- From: Alen Blew Sent: 12/26/2017   6:26 PM EST To: [name], MD  Hi there. Please see attached note from recent visit with Ms. Briles.  I'd like her to be evaluated more thoroughly for her sleep problems but unsure what route to go.  Dr. Jannifer Franklin, do you treat a lot of sleep disorder patients?  This complicated pt is fairly new to me so I want to make sure I'm getting her the best help possible.  Are there any contraindications with the Trazodone?  Thanks for your help. Donnal Moat, PA-C

## 2017-12-27 NOTE — Telephone Encounter (Signed)
Left voice mail to call back 

## 2017-12-27 NOTE — Telephone Encounter (Signed)
She called and left them a message but says they told her she needs a referral. Was asking if you will do that or will the neuro doctor?   Thanks

## 2017-12-28 ENCOUNTER — Other Ambulatory Visit (INDEPENDENT_AMBULATORY_CARE_PROVIDER_SITE_OTHER): Payer: Self-pay | Admitting: Internal Medicine

## 2018-01-08 ENCOUNTER — Encounter: Payer: Self-pay | Admitting: Neurology

## 2018-01-08 ENCOUNTER — Ambulatory Visit (INDEPENDENT_AMBULATORY_CARE_PROVIDER_SITE_OTHER): Payer: PPO | Admitting: Neurology

## 2018-01-08 ENCOUNTER — Other Ambulatory Visit: Payer: Self-pay | Admitting: Neurology

## 2018-01-08 VITALS — BP 122/64 | HR 82 | Ht 65.0 in | Wt 225.0 lb

## 2018-01-08 DIAGNOSIS — G40909 Epilepsy, unspecified, not intractable, without status epilepticus: Secondary | ICD-10-CM | POA: Diagnosis not present

## 2018-01-08 DIAGNOSIS — M545 Low back pain, unspecified: Secondary | ICD-10-CM

## 2018-01-08 DIAGNOSIS — M797 Fibromyalgia: Secondary | ICD-10-CM

## 2018-01-08 DIAGNOSIS — I679 Cerebrovascular disease, unspecified: Secondary | ICD-10-CM | POA: Diagnosis not present

## 2018-01-08 DIAGNOSIS — G8929 Other chronic pain: Secondary | ICD-10-CM | POA: Diagnosis not present

## 2018-01-08 DIAGNOSIS — M25841 Other specified joint disorders, right hand: Secondary | ICD-10-CM | POA: Diagnosis not present

## 2018-01-08 MED ORDER — GABAPENTIN 800 MG PO TABS: 800.0000 mg | ORAL_TABLET | Freq: Three times a day (TID) | ORAL | Status: DC

## 2018-01-08 MED ORDER — FREMANEZUMAB-VFRM 225 MG/1.5ML ~~LOC~~ SOSY: 225.0000 mg | PREFILLED_SYRINGE | SUBCUTANEOUS | 5 refills | Status: DC

## 2018-01-08 MED ORDER — TOPIRAMATE 50 MG PO TABS: 50.0000 mg | ORAL_TABLET | Freq: Every day | ORAL | 1 refills | Status: DC

## 2018-01-08 NOTE — Progress Notes (Signed)
Reason for visit: Fibromyalgia, migraine headache, cerebrovascular disease, chronic low back pain  Debbie Pineda is an 57 y.o. female  History of present illness:  Debbie Pineda is a 57 year old right-handed white female with a history of fibromyalgia.  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  The patient apparently has been cleared to operate a motor vehicle.  The patient has been felt to have seizure events, she has been on Topamax which was recently tapered off, but she remains on gabapentin.  The patient has not had any further seizure type events.  The patient is on Aricept for her memory, she has had neuropsychological testing that did not show clear evidence of an Alzheimer's type process.  She has come off the Topamax completely but has not noted any improvement in cognitive functioning.  She has a spinal stimulator in place, she has not gotten much benefit from this, she turned off the stimulator and has had no change in her pain levels.  Recent EMG and nerve conduction study evaluation was unremarkable.  The patient has pain down the left leg.  The patient has recently noted an increase in her headache frequency, she currently is having about 3 a week.  This change occurred about 3 weeks ago.  The patient believe that this may correlate when she came off of the Topamax.  She has had some weight gain as she has come down off of the Topamax as well.  She returns to this office for an evaluation.   Past Medical History:  Diagnosis Date  . ADD (attention deficit disorder)    takes Adderall daily  . Arthritis    "all over"  . Cerebral infarction ([place]) 10/30/2011  . Cerebrovascular disease 08/14/2016  . Chronic back pain    "all over"  . Chronic low back pain 01/11/2016  . Complication of anesthesia    tends to have hypotension when NPO and post-anesthesia  . Constipation    takes stool  softener daily  . Degenerative disk disease   . Degenerative joint disease   . Depression    takes Cymbalta daily for pain per pt  . Diabetes mellitus without complication ([place])   . DVT (deep venous thrombosis) ([facility])    RLE  . Family history of adverse reaction to anesthesia    a family member woke up during surgery; "think it was my mom"  . Fibromyalgia   . Generalized osteoarthritis of multiple sites 11/04/2013  . GERD (gastroesophageal reflux disease)   . Heart palpitations 12/14/2013  . History of blood clots    superficial  . Hypoglycemia   . Hypothyroid    takes Synthroid daily  . Incomplete emptying of bladder   . Insomnia    takes Trazodone nightly  . Iron deficiency anemia    takes Ferrous Sulfate daily  . Joint pain   . Joint swelling    knees and ankles  . Memory disorder 08/14/2016  . Morbid obesity ([place])   . Nausea    takes Zofran as needed.Seeing GI doc  . Neck pain 05/25/2016  . OSA on CPAP    tested more than 5 yrs ago.    . Osteoarthritis   . PFO (patent foramen ovale)   . Primary osteoarthritis of both feet 05/29/2016   Right bunionectomy August 2017 by Dr. Sharol Given  . Scoliosis   . Sleep apnea   . Stroke [facility]) "several"  right foot weakness; memory issues, black spot right visual field since" (03/23/2015)  . Thrombophlebitis   . Trochanteric bursitis of both hips 05/25/2016  . Unilateral primary osteoarthritis, right knee 05/29/2016  . Urinary urgency 04/19/2016  . Vein disorder 11/29/2010    Past Surgical History:  Procedure Laterality Date  . BONE EXCISION Right 08/29/2017   Procedure: right trapezium excision;  Surgeon: Daryll Brod, MD;  Location: [place];  Service: Orthopedics;  Laterality: Right;  . BUNIONECTOMY Right 08/2015  . CARDIAC CATHETERIZATION     2008.  "it was fine" (not sure why she had it done, and doesn't know where)  . CARPOMETACARPEL SUSPENSION PLASTY Right 08/29/2017   Procedure: SUSPENSION PLASTY RIGHT THUMB;  Surgeon:  Daryll Brod, MD;  Location: [place];  Service: Orthopedics;  Laterality: Right;  . COLONOSCOPY N/A 03/25/2013   Procedure: COLONOSCOPY;  Surgeon: Rogene [place], MD;  [facility];  Service: Endoscopy;  Laterality: N/A;  930  . ESOPHAGOGASTRODUODENOSCOPY    . EXPLORATORY LAPAROTOMY     "took fallopian tubes out"  . JOINT REPLACEMENT     bil knee   . KNEE ARTHROPLASTY    . KNEE ARTHROSCOPY Left   . KNEE ARTHROSCOPY W/ ACL RECONSTRUCTION Right    "added pins"  . LAPAROSCOPIC CHOLECYSTECTOMY  ~ 2001  . 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 SPINAL CORD STIMULATOR INSERTION N/A 04/18/2017   Procedure: LUMBAR SPINAL CORD STIMULATOR INSERTION;  Surgeon: Clydell Hakim, MD;  Location: [place];  Service: Neurosurgery;  Laterality: N/A;  LUMBAR SPINAL CORD STIMULATOR INSERTION  . TENDON TRANSFER Right 08/29/2017   Procedure: right abductor pollicis longus transfer;  Surgeon: Daryll Brod, MD;  Location: [place];  Service: Orthopedics;  Laterality: Right;  . TOTAL KNEE ARTHROPLASTY Left 03/23/2015   Procedure: TOTAL KNEE ARTHROPLASTY;  Surgeon: Newt Minion, MD;  Location: [place];  Service: Orthopedics;  Laterality: Left;  . TOTAL KNEE ARTHROPLASTY Right 08/15/2016   Procedure: RIGHT TOTAL KNEE ARTHROPLASTY, REMOVAL ACL SCREWS;  Surgeon: Newt Minion, MD;  Location: [place];  Service: Orthopedics;  Laterality: Right;  . TOTAL KNEE ARTHROPLASTY WITH HARDWARE REMOVAL Right   . VAGINAL HYSTERECTOMY    . VARICOSE VEIN SURGERY Right X 2    Family History  Problem Relation Age of Onset  . Heart disease Father   . Cancer Father   . Parkinson's disease Father   . Cancer Mother        skin cancer   . Myasthenia gravis Mother   . Heart disease Brother   . Cancer Brother   . Diabetes Brother   . Stroke Brother   . Heart disease Sister   . Heart attack Sister   . Cancer Maternal Grandfather   . Hypothyroidism Daughter   . Hypertension Other   . Colon cancer  Neg Hx     Social history:  reports that she quit smoking about 27 years ago. Her smoking use included cigarettes. She has a 6.00 pack-year smoking history. She has never used smokeless tobacco. She reports that she does not drink alcohol or use drugs.    Allergies  Allergen Reactions  . Lyrica [Pregabalin] Other (See Comments)    SOB, lower extremity edema and weight gain  . Flexeril [Cyclobenzaprine Hcl] Other (See Comments)    Numbness of extremities  . Morphine And Related Itching    Upper torso  . Sulfamethoxazole-Trimethoprim Itching and Rash    Bactrim  .  Belsomra [Suvorexant] Other (See Comments)    unknown  . Tape Itching and Rash    Please use "paper" tape Rash if left on longer than 24 hrs    Medications:  Prior to Admission medications   Medication Sig Start Date End Date Taking? Authorizing Provider  Buprenorphine HCl 600 MCG FILM Place inside cheek. 12/25/17  Yes [provider]  tiZANidine (ZANAFLEX) 4 MG tablet Take by mouth. 12/25/17  Yes [provider]  amoxicillin (AMOXIL) 500 MG capsule Take 500 mg by mouth. As needed for dental appt. 4caps    [provider]  baclofen (LIORESAL) 10 MG tablet Take 10 mg by mouth 3 (three) times daily.  06/30/17   [provider]  Continuous Blood Gluc Sensor (FREESTYLE LIBRE 14 DAY SENSOR) MISC  06/05/17   [provider]  Cyanocobalamin (B-12) 5000 MCG SUBL Place 5,000 mcg under the tongue daily at 12 noon.    [provider]  diclofenac sodium (VOLTAREN) 1 % GEL Apply 4 g topically 4 (four) times daily as needed (PAIN).     [provider]  donepezil (ARICEPT) 10 MG tablet Take 1 tablet (10 mg total) by mouth at bedtime. 11/27/17   [name], MD  DULoxetine (CYMBALTA) 60 MG capsule Take 60 mg by mouth 2 (two) times daily. 01/17/15   [provider]  folic acid (FOLVITE) 478 MCG tablet Take 400 mcg by mouth daily.    [provider]    Fremanezumab-vfrm (AJOVY) 225 MG/1.5ML SOSY Inject 225 mg into the skin every 30 (thirty) days. 07/12/17   Ward Givens, NP  gabapentin (NEURONTIN) 600 MG tablet Take 800 mg by mouth 3 (three) times daily.     [provider]  KRILL OIL PO Take by mouth.    [provider]  L-Methylfolate-B12-B6-B2 (CEREFOLIN) 06-22-48-5 MG TABS Take 1 tablet by mouth daily. 09/26/17   Ward Givens, NP  levothyroxine (SYNTHROID, LEVOTHROID) 125 MCG tablet Take 125 mcg by mouth daily before breakfast.    [provider]  methylphenidate (RITALIN) 10 MG tablet Take 1 tablet (10 mg total) by mouth 2 (two) times daily. 12/24/17   Donnal Moat T, PA-C  methylphenidate (RITALIN) 20 MG tablet Take 10 mg by mouth 3 (three) times daily with meals. Between 0800 and 1600. Takes 1/2 tablet up to 5 times daily    [provider]  mirabegron ER (MYRBETRIQ) 50 MG TB24 tablet Take 50 mg by mouth daily.    [provider]  Multiple Minerals-Vitamins (CAL-MAG-ZINC-D PO) Take 1-2 tablets by mouth See admin instructions. Take 2 tablet at 1200 and 1 tablet at 1600  Calcium 1000 mg Vit D 600 iu Magnesium 400 mg Zinc 15 mg    [provider]  Multiple Vitamins-Minerals (ONE-A-DAY WOMENS PETITES) TABS Take 1 tablet by mouth 2 (two) times daily.    [provider]  NARCAN 4 MG/0.1ML LIQD nasal spray kit Place 1 spray into the nose as needed (over dose).  11/14/16   [provider]  omeprazole (PRILOSEC) 40 MG capsule Take 1 capsule by mouth twice daily. 12/30/17   Setzer, Rona Ravens, NP  ondansetron (ZOFRAN-ODT) 4 MG disintegrating tablet Take 4 mg by mouth every 6 (six) hours as needed for nausea.  03/02/15   [provider]  Oxycodone HCl 10 MG TABS Take 1 tablet (10 mg total) by mouth every 4 (four) hours as needed. Patient not taking: Reported on 10/18/2017 04/18/17   Clydell Hakim, MD  Polyvinyl Alcohol-Povidone (REFRESH OP) Place 1-2 drops into both eyes 3  (three) times daily as needed (dry eyes).    [provider]  rivaroxaban (XARELTO) 20 MG TABS tablet Take 1 tablet (20 mg total) by mouth daily with supper. Patient taking differently: Take 20 mg by mouth at bedtime.  03/25/15   Newt Minion, MD  topiramate (TOPAMAX) 100 MG tablet Take 100 mg by mouth 2 (two) times daily.    [provider]  traMADol (ULTRAM) 50 MG tablet TAKE 1 TABLET BY MOUTH 4 TIMES DAILY 11/05/17   [provider]  traZODone (DESYREL) 100 MG tablet Take 100-200 mg by mouth at bedtime as needed for sleep (depends on insomnia).     [provider]  UNABLE TO FIND Apply 1 application topically 2 (two) times daily as needed (pain). Med Name: C-Anti-Inflammatory Cream  Diclofenac 3% / Baclofen 2% / Cyclobenzaprine 2% / Lidocaine 2%    [provider]  Vitamin D, Ergocalciferol, (DRISDOL) 50000 UNITS CAPS Take 50,000 Units by mouth every 7 (seven) days. Mondays    [provider]  Merril Abbe 10 MCG TABS vaginal tablet  10/30/17   [provider]    ROS:  Out of a complete 14 system review of symptoms, the patient complains only of the following symptoms, and all other reviewed systems are negative.  Fatigue Runny nose, drooling Eye redness, light sensitivity, eye pain Sleep apnea Joint pain, joint swelling, back pain, aching muscles, walking difficulty, neck pain, neck stiffness Itching Headache, weakness   Blood pressure 122/64, pulse 82, height '5\' 5"'$  (1.651 m), weight 225 lb (102.1 kg).  Physical Exam  General: The patient is alert and cooperative at the time of the examination.  The patient is moderately obese.  Skin: No significant peripheral edema is noted.   Neurologic Exam  Mental status: The patient is alert and oriented x 3 at the time of the examination. The patient has apparent normal recent and remote memory, with an apparently normal attention span and concentration ability.   Cranial  nerves: Facial symmetry is present. Speech is normal, no aphasia or dysarthria is noted. Extraocular movements are full. Visual fields are full.  Motor: The patient has good strength in all 4 extremities.  Sensory examination: Soft touch sensation is symmetric on the face, arms, and legs.  Coordination: The patient has good finger-nose-finger and heel-to-shin bilaterally.  Gait and station: The patient has a normal gait. Tandem gait is slightly unsteady. Romberg is negative. No drift is seen.  Reflexes: Deep tendon reflexes are symmetric.   Assessment/Plan:  1.  Intractable migraine headache  2.  Chronic low back pain, left leg pain  3.  Fibromyalgia  4.  Seizures  5.  Memory disturbance  6.  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  The patient will go back on Topamax as she has not noted any improvement in her cognitive functioning off the medication.  She will go on 50 mg at night, if she is not doing well after about 3 weeks she will contact our office and we will go up to 100 mg at night.  The patient will follow-up in 4 months.  She is to get into a low-grade exercise program to help her fibromyalgia and assist with weight loss.   Jill Alexanders MD 01/08/2018 9:15 AM  [facility] [address] [place],  [address]  Phone [phone] Fax [phone]

## 2018-01-08 NOTE — Telephone Encounter (Signed)
Ajovy escribed to [facility] per pt's request/fim

## 2018-01-08 NOTE — Telephone Encounter (Signed)
Pt requesting refills for Fremanezumab-vfrm (AJOVY) 225 MG/1.5ML SOSY sent to [facility]

## 2018-01-12 ENCOUNTER — Encounter: Payer: Self-pay | Admitting: Emergency Medicine

## 2018-01-12 DIAGNOSIS — G4700 Insomnia, unspecified: Secondary | ICD-10-CM

## 2018-01-12 DIAGNOSIS — F909 Attention-deficit hyperactivity disorder, unspecified type: Secondary | ICD-10-CM | POA: Insufficient documentation

## 2018-01-13 DIAGNOSIS — J029 Acute pharyngitis, unspecified: Secondary | ICD-10-CM | POA: Diagnosis not present

## 2018-01-13 DIAGNOSIS — B349 Viral infection, unspecified: Secondary | ICD-10-CM | POA: Diagnosis not present

## 2018-01-13 DIAGNOSIS — Z6837 Body mass index (BMI) 37.0-37.9, adult: Secondary | ICD-10-CM | POA: Diagnosis not present

## 2018-01-13 DIAGNOSIS — R05 Cough: Secondary | ICD-10-CM | POA: Diagnosis not present

## 2018-01-13 DIAGNOSIS — J343 Hypertrophy of nasal turbinates: Secondary | ICD-10-CM | POA: Diagnosis not present

## 2018-01-13 DIAGNOSIS — R52 Pain, unspecified: Secondary | ICD-10-CM | POA: Diagnosis not present

## 2018-01-31 ENCOUNTER — Encounter: Payer: Self-pay | Admitting: Physician Assistant

## 2018-01-31 ENCOUNTER — Ambulatory Visit: Payer: PPO | Admitting: Physician Assistant

## 2018-01-31 VITALS — BP 97/62 | HR 59

## 2018-01-31 DIAGNOSIS — G4700 Insomnia, unspecified: Secondary | ICD-10-CM

## 2018-01-31 DIAGNOSIS — F900 Attention-deficit hyperactivity disorder, predominantly inattentive type: Secondary | ICD-10-CM | POA: Diagnosis not present

## 2018-01-31 MED ORDER — LISDEXAMFETAMINE DIMESYLATE 40 MG PO CAPS: 40.0000 mg | ORAL_CAPSULE | ORAL | 0 refills | Status: DC

## 2018-01-31 NOTE — Progress Notes (Signed)
[facility]  Patient ID: Debbie Pineda,  MRN: [id number]  PCP: Jake Samples, PA-C  Date of Evaluation: 01/31/2018 Time spent:15 minutes  Chief Complaint:  Chief Complaint    Follow-up      HISTORY/CURRENT STATUS: HPI here for routine follow-up.  At the last visit, there had been some confusion about whether she was able to take the trazodone or not.  She had mentioned that 1 of her providers said it was not a good idea.  I did reach out to her neurologist and rheumatologist who both stated it is fine for her to take.  She has continued to do so and even though she still does not sleep great, it is helpful.  She has trouble being alert and getting tasks done if she is not taking a stimulant.  I have tried to wean her off the Ritalin, thinking that was at least a part of the problem with sleep disturbance.  She states this made no difference with that issue but it has decreased her ability to finish tasks.  She also has trouble remembering things and concentrating without being easily distracted.  Patient denies loss of interest in usual activities and is able to enjoy things.  Denies decreased energy or motivation.  Appetite has not changed.  No extreme sadness, tearfulness, or feelings of hopelessness.  Denies any changes in concentration, making decisions or remembering things.  Denies suicidal or homicidal thoughts.  Patient denies increased energy with decreased need for sleep, no increased talkativeness, no racing thoughts, no impulsivity or risky behaviors, no increased spending, no increased libido, no grandiosity.  She continues to deal with chronic pain.  She is under the care of pain management, rheumatology, neurology.  No medication changes have been made.  Individual Medical History/ Review of Systems: Changes? :No    Past medications for mental health diagnoses include: Adderall, Concerta, Lunesta, Ambien, Trazodone, Belsomra, Benadryl, Melatonin,  Sonata, Celexa, Xanax, Ritalin, Topamax, Cymbalta, Aricept  Allergies: Lyrica [pregabalin]; Flexeril [cyclobenzaprine hcl]; Morphine and related; Sulfamethoxazole-trimethoprim; Belsomra [suvorexant]; and Tape  Current Medications:  Current Outpatient Medications:  .  amoxicillin (AMOXIL) 500 MG capsule, Take 500 mg by mouth. As needed for dental appt. 4caps, Disp: , Rfl:  .  baclofen (LIORESAL) 10 MG tablet, Take 10 mg by mouth 3 (three) times daily. , Disp: , Rfl: 1 .  Buprenorphine HCl 600 MCG FILM, Place inside cheek., Disp: , Rfl:  .  Continuous Blood Gluc Sensor (FREESTYLE LIBRE 14 DAY SENSOR) MISC, , Disp: , Rfl:  .  Cyanocobalamin (B-12) 5000 MCG SUBL, Place 5,000 mcg under the tongue daily at 12 noon. 8 am, Disp: , Rfl:  .  diclofenac sodium (VOLTAREN) 1 % GEL, Apply 4 g topically 4 (four) times daily as needed (PAIN). , Disp: , Rfl:  .  donepezil (ARICEPT) 10 MG tablet, Take 1 tablet (10 mg total) by mouth at bedtime., Disp: 30 tablet, Rfl: 1 .  DULoxetine (CYMBALTA) 60 MG capsule, Take 60 mg by mouth 2 (two) times daily., Disp: , Rfl: 11 .  folic acid (FOLVITE) 235 MCG tablet, Take 400 mcg by mouth daily., Disp: , Rfl:  .  Fremanezumab-vfrm (AJOVY) 225 MG/1.5ML SOSY, Inject 225 mg into the skin every 30 (thirty) days., Disp: 1.5 mL, Rfl: 5 .  gabapentin (NEURONTIN) 800 MG tablet, Take 1 tablet (800 mg total) by mouth 3 (three) times daily., Disp: , Rfl:  .  KRILL OIL PO, Take by mouth., Disp: , Rfl:  .  L-Methylfolate-B12-B6-B2 (CEREFOLIN) 06-22-48-5 MG TABS, Take 1 tablet by mouth daily., Disp: 90 each, Rfl: 3 .  levothyroxine (SYNTHROID, LEVOTHROID) 125 MCG tablet, Take 125 mcg by mouth daily before breakfast., Disp: , Rfl:  .  methylphenidate (RITALIN) 20 MG tablet, Take 20 mg by mouth 2 (two) times daily. 1/2 - 1 po tid prn, Disp: , Rfl:  .  mirabegron ER (MYRBETRIQ) 50 MG TB24 tablet, Take 50 mg by mouth daily., Disp: , Rfl:  .  Multiple Minerals-Vitamins (CAL-MAG-ZINC-D PO), Take  1-2 tablets by mouth See admin instructions. Take 2 tablet at 1200 and 1 tablet at 1600  Calcium 1000 mg Vit D 600 iu Magnesium 400 mg Zinc 15 mg, Disp: , Rfl:  .  Multiple Vitamins-Minerals (ONE-A-DAY WOMENS PETITES PO), Take by mouth 2 (two) times daily., Disp: , Rfl:  .  NARCAN 4 MG/0.1ML LIQD nasal spray kit, Place 1 spray into the nose as needed (over dose). , Disp: , Rfl:  .  omeprazole (PRILOSEC) 40 MG capsule, Take 1 capsule by mouth twice daily., Disp: 60 capsule, Rfl: 2 .  ondansetron (ZOFRAN-ODT) 4 MG disintegrating tablet, Take 4 mg by mouth every 6 (six) hours as needed for nausea. , Disp: , Rfl: 2 .  Polyvinyl Alcohol-Povidone (REFRESH OP), Place 1-2 drops into both eyes 3 (three) times daily as needed (dry eyes)., Disp: , Rfl:  .  rivaroxaban (XARELTO) 20 MG TABS tablet, Take 1 tablet (20 mg total) by mouth daily with supper. (Patient taking differently: Take 20 mg by mouth at bedtime. ), Disp: 30 tablet, Rfl: 1 .  tiZANidine (ZANAFLEX) 4 MG tablet, Take by mouth., Disp: , Rfl:  .  topiramate (TOPAMAX) 50 MG tablet, Take 1 tablet (50 mg total) by mouth at bedtime., Disp: 90 tablet, Rfl: 1 .  traMADol (ULTRAM) 50 MG tablet, TAKE 1 TABLET BY MOUTH 4 TIMES DAILY, Disp: , Rfl: 2 .  traZODone (DESYREL) 100 MG tablet, Take 100-200 mg by mouth at bedtime as needed for sleep (depends on insomnia). , Disp: , Rfl:  .  Vitamin D, Ergocalciferol, (DRISDOL) 50000 UNITS CAPS, Take 50,000 Units by mouth every 7 (seven) days. Mondays, Disp: , Rfl:  .  YUVAFEM 10 MCG TABS vaginal tablet, , Disp: , Rfl:  .  gabapentin (NEURONTIN) 600 MG tablet, Take 600 mg by mouth 3 (three) times daily., Disp: , Rfl:  .  lisdexamfetamine (VYVANSE) 40 MG capsule, Take 1 capsule (40 mg total) by mouth every morning., Disp: 30 capsule, Rfl: 0 .  topiramate (TOPAMAX) 25 MG tablet, Take 25 mg by mouth 2 (two) times daily., Disp: , Rfl:  Medication Side Effects: none  Family Medical/ Social History: Changes? No  MENTAL  HEALTH EXAM:  Blood pressure 97/62, pulse (!) 59.There is no height or weight on file to calculate BMI.  General Appearance: Casual, Well Groomed and Obese  Eye Contact:  Good  Speech:  Clear and Coherent  Volume:  Normal  Mood:  Euthymic  Affect:  Appropriate  Thought Process:  Goal Directed  Orientation:  Full (Time, Place, and Person)  Thought Content: Logical   Suicidal Thoughts:  No  Homicidal Thoughts:  No  Memory:  WNL  Judgement:  Good  Insight:  Good  Psychomotor Activity:  Normal  Concentration:  Concentration: Fair and Attention Span: Fair  Recall:  Good  Fund of Knowledge: Good  Language: Good  Assets:  Desire for Improvement  ADL's:  Intact  Cognition: WNL  Prognosis:  Fair  DIAGNOSES:    ICD-10-CM   1. Insomnia, unspecified type G47.00   2. Attention deficit hyperactivity disorder (ADHD), predominantly inattentive type F90.0   Numerous physical health problems including fibromyalgia, chronic back pain secondary to disc disease, spinal stenosis, osteoarthritis, history of CVA, hypothyroidism, overactive bladder, patent foramen ovale, vitamin D deficiency  Receiving Psychotherapy: No    RECOMMENDATIONS: Start Vyvanse 40 mg p.o. every morning.  I do believe a stimulant is helpful for her but I am hoping that with a longer acting better controlled release she will respond well but not have insomnia due to the stimulant.  She understands risks, benefits, side effects and accepts.  If her insurance denies the Vyvanse or the co-pay is too high, I will call in Concerta.  She remembers taking it at some point but does not remember how long ago that was and what happened with the drug.  She thinks maybe it just stopped working after a long while.  She will call in approximately 3 weeks, if insurance pays for the Vyvanse, and let me know how she is doing.  If the dose working well then I can give her 2 more prescriptions to last until her next appointment. Continue all  other medications. Return in 3 months or sooner as needed.   Donnal Moat, PA-C

## 2018-02-05 ENCOUNTER — Other Ambulatory Visit (INDEPENDENT_AMBULATORY_CARE_PROVIDER_SITE_OTHER): Payer: Self-pay | Admitting: Specialist

## 2018-02-05 ENCOUNTER — Encounter (INDEPENDENT_AMBULATORY_CARE_PROVIDER_SITE_OTHER): Payer: Self-pay | Admitting: Specialist

## 2018-02-05 ENCOUNTER — Ambulatory Visit (INDEPENDENT_AMBULATORY_CARE_PROVIDER_SITE_OTHER): Payer: PPO | Admitting: Specialist

## 2018-02-05 VITALS — BP 95/59 | HR 69 | Ht 65.0 in | Wt 210.0 lb

## 2018-02-05 DIAGNOSIS — M7712 Lateral epicondylitis, left elbow: Secondary | ICD-10-CM | POA: Diagnosis not present

## 2018-02-05 NOTE — Telephone Encounter (Signed)
Sent request to Dr. Nitka 

## 2018-02-05 NOTE — Progress Notes (Signed)
Office Visit Note   Patient: Debbie Pineda           Date of Birth: [birth year]           MRN: [id number] Visit Date: 02/05/2018              Requested by: Jake Samples, PA-C [address] [place], [place] [address] PCP: Jake Samples, PA-C   Assessment & Plan: Visit Diagnoses:  1. Lateral epicondylitis, left elbow     Plan: Instructions on left elbow lateral epicondylitis provided. Home exercises. Ice 3-4 times per day for 10-15 minutes. Use voltaren gel 3-4 time per day.   Follow-Up Instructions: Return in about 4 weeks (around 03/05/2018).   Orders:  No orders of the defined types were placed in this encounter.  No orders of the defined types were placed in this encounter.     Procedures: No procedures performed   Clinical Data: No additional findings.   Subjective: Chief Complaint  Patient presents with  . Left Hip - Follow-up    58 year old female with a couple of week history of left elbow pain at the area of the out left elbow. She was carrying bags of grocery and sometime carrying to much is pain ful. Pain with lying on the left side. Also with pain with lying of the left greater trochanter. Can't lie still for more than an hour. She is seeing Dr. Sheliah Mends, pain management specialist through [facility] and he is doing evaluation of her back condition and considering removal of a Spinal Cord Stimulator. She did a trial of stopping a SCS and found worsening of pain restarting the Spinal cord stimulator then helped.    Review of Systems  Constitutional: Negative.   HENT: Positive for congestion, sinus pressure and sinus pain.   Eyes: Positive for pain.  Respiratory: Negative.  Negative for apnea, cough, choking, chest tightness, shortness of breath, wheezing and stridor.   Cardiovascular: Positive for leg swelling. Negative for chest pain and palpitations.  Gastrointestinal: Negative.  Negative for abdominal distention, abdominal pain,  anal bleeding, blood in stool, constipation, diarrhea, nausea, rectal pain and vomiting.  Endocrine: Positive for cold intolerance and heat intolerance. Negative for polydipsia and polyphagia.  Genitourinary: Negative.  Negative for difficulty urinating, dyspareunia, dysuria, enuresis, flank pain and frequency.  Musculoskeletal: Positive for joint swelling.  Skin: Negative.  Negative for color change, pallor, rash and wound.  Allergic/Immunologic: Negative.   Neurological: Positive for light-headedness and headaches.  Hematological: Negative.  Negative for adenopathy. Does not bruise/bleed easily.  Psychiatric/Behavioral: Negative.  Negative for agitation, behavioral problems, confusion, decreased concentration, dysphoric mood, hallucinations, self-injury, sleep disturbance and suicidal ideas. The patient is not nervous/anxious and is not hyperactive.      Objective: Vital Signs: BP (!) 95/59 (BP Location: Left Arm, Patient Position: Sitting)   Pulse 69   Ht 5\' 5"  (1.651 m)   Wt 210 lb (95.3 kg)   BMI 34.95 kg/m   Physical Exam Constitutional:      Appearance: She is well-developed.  HENT:     Head: Normocephalic and atraumatic.  Eyes:     Pupils: Pupils are equal, round, and reactive to light.  Neck:     Musculoskeletal: Normal range of motion and neck supple.  Pulmonary:     Effort: Pulmonary effort is normal.     Breath sounds: Normal breath sounds.  Abdominal:     General: Bowel sounds are normal.     Palpations: Abdomen  is soft.  Musculoskeletal: Normal range of motion.  Skin:    General: Skin is warm and dry.  Neurological:     Mental Status: She is alert and oriented to person, place, and time.  Psychiatric:        Behavior: Behavior normal.        Thought Content: Thought content normal.        Judgment: Judgment normal.     Left Elbow Exam   Tenderness  The patient is experiencing tenderness in the lateral epicondyle.   Range of Motion  Extension:  0  normal  Flexion: 130  Pronation: normal  Supination: normal   Muscle Strength  The patient has normal left elbow strength.  Tests  Varus: negative Valgus: negative Tinel's sign (cubital tunnel): negative  Other  Erythema: absent Scars: absent Sensation: normal Pulse: present  Comments:  Tender left lateral epicondyle elbow ROM 0-135 degrees.       Specialty Comments:  No specialty comments available.  Imaging: No results found.   PMFS History: Patient Active Problem List   Diagnosis Date Noted  . Attention deficit hyperactivity disorder (ADHD) 01/12/2018  . Insomnia 01/12/2018  . Elevated liver enzymes 09/10/2017  . History of diabetes mellitus 06/06/2017  . Primary osteoarthritis of right hand 06/06/2017  . Transaminasemia   . TIA (transient ischemic attack) 05/01/2017  . Dysphasia 05/01/2017  . Chronic pain syndrome 05/01/2017  . Confusion   . Presence of right artificial knee joint 09/17/2016  . H/O total knee replacement, right 08/15/2016  . Presence of retained hardware   . Memory disorder 08/14/2016  . Cerebrovascular disease 08/14/2016  . Unilateral primary osteoarthritis, right knee 05/29/2016  . Primary osteoarthritis of both feet 05/29/2016  . Trochanteric bursitis of both hips 05/25/2016  . Neck pain 05/25/2016  . Urinary urgency 04/19/2016  . Chronic low back pain 01/11/2016  . Depression 11/29/2015  . 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 Heart palpitations 12/14/2013  . Generalized osteoarthritis of multiple sites 11/04/2013  . Cerebral infarction ([place]) 10/30/2011  . PFO (patent foramen ovale) 10/30/2011  . Bradycardia 10/30/2011  . Chest pain 10/30/2011  . Hypothyroid   . Thrombophlebitis   . Sleep apnea   . Fibromyalgia   . 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 Status post bariatric surgery 12/07/2010  . Vein disorder 11/29/2010  . S/P total hysterectomy and bilateral salpingo-oophorectomy 11/29/2010  . S/P  exploratory laparotomy 11/29/2010  . S/P cholecystectomy 11/29/2010  . S/P ACL surgery 11/29/2010  . Morbid obesity ([place]) 11/10/2010   Past Medical History:  Diagnosis Date  . ADD (attention deficit disorder)    takes Adderall daily  . Arthritis    "all over"  . Cerebral infarction ([place]) 10/30/2011  . Cerebrovascular disease 08/14/2016  . Chronic back pain    "all over"  . Chronic low back pain 01/11/2016  . Complication of anesthesia    tends to have hypotension when NPO and post-anesthesia  . Constipation    takes stool softener daily  . Degenerative disk disease   . Degenerative joint disease   . Depression    takes Cymbalta daily for pain per pt  . Diabetes mellitus without complication ([place])   . DVT (deep venous thrombosis) ([facility])    RLE  . Family history of adverse reaction to anesthesia    a family member woke up during surgery; "think it was my mom"  . Fibromyalgia   . Generalized osteoarthritis of multiple sites 11/04/2013  .  GERD (gastroesophageal reflux disease)   . Heart palpitations 12/14/2013  . History of blood clots    superficial  . Hypoglycemia   . Hypothyroid    takes Synthroid daily  . Incomplete emptying of bladder   . Insomnia    takes Trazodone nightly  . Iron deficiency anemia    takes Ferrous Sulfate daily  . Joint pain   . Joint swelling    knees and ankles  . Memory disorder 08/14/2016  . Morbid obesity ([place])   . Nausea    takes Zofran as needed.Seeing GI doc  . Neck pain 05/25/2016  . OSA on CPAP    tested more than 5 yrs ago.    . Osteoarthritis   . PFO (patent foramen ovale)   . Primary osteoarthritis of both feet 05/29/2016   Right bunionectomy August 2017 by Dr. Sharol Given  . Scoliosis   . Sleep apnea   . Stroke [facility]) "several"   right foot weakness; memory issues, black spot right visual field since" (03/23/2015)  . Thrombophlebitis   . Trochanteric bursitis of both hips 05/25/2016  . Unilateral primary osteoarthritis, right knee 05/29/2016    . Urinary urgency 04/19/2016  . Vein disorder 11/29/2010    Family History  Problem Relation Age of Onset  . Heart disease Father   . Cancer Father   . Parkinson's disease Father   . Cancer Mother        skin cancer   . Myasthenia gravis Mother   . Heart disease Brother   . Cancer Brother   . Diabetes Brother   . Stroke Brother   . Heart disease Sister   . Heart attack Sister   . Cancer Maternal Grandfather   . Hypothyroidism Daughter   . Hypertension Other   . Colon cancer Neg Hx     Past Surgical History:  Procedure Laterality Date  . BONE EXCISION Right 08/29/2017   Procedure: right trapezium excision;  Surgeon: Daryll Brod, MD;  Location: [place];  Service: Orthopedics;  Laterality: Right;  . BUNIONECTOMY Right 08/2015  . CARDIAC CATHETERIZATION     2008.  "it was fine" (not sure why she had it done, and doesn't know where)  . CARPOMETACARPEL SUSPENSION PLASTY Right 08/29/2017   Procedure: SUSPENSION PLASTY RIGHT THUMB;  Surgeon: Daryll Brod, MD;  Location: [place];  Service: Orthopedics;  Laterality: Right;  . COLONOSCOPY N/A 03/25/2013   Procedure: COLONOSCOPY;  Surgeon: Rogene Houston, MD;  [facility];  Service: Endoscopy;  Laterality: N/A;  930  . ESOPHAGOGASTRODUODENOSCOPY    . EXPLORATORY LAPAROTOMY     "took fallopian tubes out"  . JOINT REPLACEMENT     bil knee   . KNEE ARTHROPLASTY    . KNEE ARTHROSCOPY Left   . KNEE ARTHROSCOPY W/ ACL RECONSTRUCTION Right    "added pins"  . LAPAROSCOPIC CHOLECYSTECTOMY  ~ 2001  . 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 SPINAL CORD STIMULATOR INSERTION N/A 04/18/2017   Procedure: LUMBAR SPINAL CORD STIMULATOR INSERTION;  Surgeon: Clydell Hakim, MD;  Location: [place];  Service: Neurosurgery;  Laterality: N/A;  LUMBAR SPINAL CORD STIMULATOR INSERTION  . TENDON TRANSFER Right 08/29/2017   Procedure: right abductor pollicis longus transfer;  Surgeon: Daryll Brod, MD;  Location: [place];  Service: Orthopedics;  Laterality: Right;  . TOTAL KNEE ARTHROPLASTY Left 03/23/2015   Procedure: TOTAL KNEE ARTHROPLASTY;  Surgeon: Newt Minion, MD;  Location: [place];  Service:  Orthopedics;  Laterality: Left;  . TOTAL KNEE ARTHROPLASTY Right 08/15/2016   Procedure: RIGHT TOTAL KNEE ARTHROPLASTY, REMOVAL ACL SCREWS;  Surgeon: Newt Minion, MD;  Location: [place];  Service: Orthopedics;  Laterality: Right;  . TOTAL KNEE ARTHROPLASTY WITH HARDWARE REMOVAL Right   . VAGINAL HYSTERECTOMY    . VARICOSE VEIN SURGERY Right X 2   Social History   Occupational History  . Occupation: Disability  . Occupation: formerly Therapist, sports, [employer]  Tobacco Use  . Smoking status: Former Smoker    Packs/day: 0.75    Years: 8.00    Pack years: 6.00    Types: Cigarettes    Last attempt to quit: 12/01/1990    Years since quitting: 27.2  . Smokeless tobacco: Never Used  . Tobacco comment: quit smoking in the 1990s  Substance and Sexual Activity  . Alcohol use: No    Comment: 03/23/2015 "stopped drinking in 2012 w/gastric bypass; drank socially before bypass"  . Drug use: No  . Sexual activity: Not Currently    Birth control/protection: Surgical

## 2018-02-05 NOTE — Patient Instructions (Signed)
Instructions on left elbow lateral epicondylitis provided. Home exercises. Ice 3-4 times per day for 10-15 minutes. Use voltaren gel 3-4 time per day.

## 2018-02-05 NOTE — Telephone Encounter (Signed)
Patient called stating she forgot to tell Nitka @ appt today that she needed a refill fordiclofenac sodium (VOLTAREN) 1 % GEL   Please call into [facility] in [place].  Call patient to advise

## 2018-02-06 MED ORDER — DICLOFENAC SODIUM 1 % TD GEL: 4.0000 g | Freq: Four times a day (QID) | TRANSDERMAL | 1 refills | Status: AC | PRN

## 2018-02-07 ENCOUNTER — Telehealth: Payer: Self-pay

## 2018-02-07 NOTE — Telephone Encounter (Signed)
Prior Auth approved from [place] for Vyvanse 40 Mg 02/03/2018-02/03/2019.

## 2018-02-11 DIAGNOSIS — M47816 Spondylosis without myelopathy or radiculopathy, lumbar region: Secondary | ICD-10-CM | POA: Diagnosis not present

## 2018-02-11 DIAGNOSIS — M5136 Other intervertebral disc degeneration, lumbar region: Secondary | ICD-10-CM | POA: Diagnosis not present

## 2018-02-11 DIAGNOSIS — M159 Polyosteoarthritis, unspecified: Secondary | ICD-10-CM | POA: Diagnosis not present

## 2018-02-13 ENCOUNTER — Telehealth: Payer: Self-pay

## 2018-02-13 NOTE — Telephone Encounter (Signed)
Patient mentioned that her headaches have increased.  She is having 2 bad headaches a day. She is taking Topamax 100 mg at bedtime in addition to her Ajovy. She is requesting a sooner appointment. Please advise.

## 2018-02-13 NOTE — Telephone Encounter (Signed)
I contacted the pt and advised we could see her 02/20/18 at 7:30 with Dr. Jannifer Franklin. Pt was agreeable. Appt made in epic.

## 2018-02-18 ENCOUNTER — Telehealth: Payer: Self-pay | Admitting: Neurology

## 2018-02-18 DIAGNOSIS — M47816 Spondylosis without myelopathy or radiculopathy, lumbar region: Secondary | ICD-10-CM | POA: Diagnosis not present

## 2018-02-18 DIAGNOSIS — M797 Fibromyalgia: Secondary | ICD-10-CM | POA: Diagnosis not present

## 2018-02-18 DIAGNOSIS — M159 Polyosteoarthritis, unspecified: Secondary | ICD-10-CM | POA: Diagnosis not present

## 2018-02-18 DIAGNOSIS — M5136 Other intervertebral disc degeneration, lumbar region: Secondary | ICD-10-CM | POA: Diagnosis not present

## 2018-02-18 NOTE — Telephone Encounter (Signed)
Patient had an appointment with Dr. Jannifer Franklin for a headache f/u work-in due to worsening headaches on Thursday 02/18/18 and an appointment with Dr. Brett Fairy for a sleep consult on the same day. I called patient to reschedule one of the two appointments due to insurance purposes. Insurance will only cover one visit at the same facility in the same day. Patient states her headaches are better due to her discontinuing her ADHD medication. Patient states that she preferred to keep appointment with Dr. Brett Fairy on 02/18/18 for her sleep consult and stated to cancel appointment with Dr. Jannifer Franklin. Patient is aware she has an appointment with Butler Denmark, NP in April 2020 and she can call us to come back sooner if needed.

## 2018-02-20 ENCOUNTER — Ambulatory Visit: Payer: PPO | Admitting: Neurology

## 2018-02-20 ENCOUNTER — Ambulatory Visit: Payer: Self-pay | Admitting: Neurology

## 2018-02-20 ENCOUNTER — Encounter: Payer: Self-pay | Admitting: Neurology

## 2018-02-20 ENCOUNTER — Telehealth: Payer: Self-pay | Admitting: Physician Assistant

## 2018-02-20 ENCOUNTER — Other Ambulatory Visit: Payer: Self-pay | Admitting: Physician Assistant

## 2018-02-20 VITALS — BP 109/72 | HR 71 | Ht 65.0 in | Wt 222.0 lb

## 2018-02-20 DIAGNOSIS — Q211 Atrial septal defect: Secondary | ICD-10-CM

## 2018-02-20 DIAGNOSIS — G4719 Other hypersomnia: Secondary | ICD-10-CM

## 2018-02-20 DIAGNOSIS — G4737 Central sleep apnea in conditions classified elsewhere: Secondary | ICD-10-CM

## 2018-02-20 DIAGNOSIS — R001 Bradycardia, unspecified: Secondary | ICD-10-CM | POA: Diagnosis not present

## 2018-02-20 DIAGNOSIS — I6340 Cerebral infarction due to embolism of unspecified cerebral artery: Secondary | ICD-10-CM | POA: Diagnosis not present

## 2018-02-20 DIAGNOSIS — Z9884 Bariatric surgery status: Secondary | ICD-10-CM

## 2018-02-20 DIAGNOSIS — Q2112 Patent foramen ovale: Secondary | ICD-10-CM

## 2018-02-20 DIAGNOSIS — Z79891 Long term (current) use of opiate analgesic: Secondary | ICD-10-CM | POA: Diagnosis not present

## 2018-02-20 MED ORDER — METHYLPHENIDATE HCL ER (OSM) 36 MG PO TBCR: 36.0000 mg | EXTENDED_RELEASE_TABLET | Freq: Every day | ORAL | 0 refills | Status: DC

## 2018-02-20 NOTE — Progress Notes (Signed)
SLEEP MEDICINE CLINIC   Provider:  Larey Seat, [place] D  Primary Care Physician:  [name]   Referring Provider: Adelene Idler, [facility],  Jill Alexanders, MD    Chief Complaint  Patient presents with  . New Patient (Initial Visit)    pt alone, rm 10. pt states that she has been having sleep problems for yrs. history of stroke. pt has been a CPAP user for years. and pt is feeling tired in mornings and PCP wanted to make sure CPAP working effectively.. DME [facility]. last sleep study in [place] in [place] and she believes over 5 yrs    HPI:  Debbie Pineda is a 58 y.o. female and followed at [facility] by dr. Jannifer Franklin. This referral came form her psychiatric Provider , PA hurst, and is meant to address residual excessive daytime sleepiness in the presence of psychiatric disease, incompletely treated OSA and pain medication induced sleepiness. She has also cerebrovascular disease and memory loss. Her sleep tests were at [facility] and over 5 years ago- she has no follow up and gets supplies from [facility].    Chief complaint according to patient : " I am using CPAP for over 5 years, and nobody follows up".  Sleep habits are as follows: Dinner time is 6 pm and may have a late evening snack, she goes to bed at 10 PM. Bedroom is cool, quiet and dark always uses CPAP and  falls asleep on her left side, on one pillow. She is chronically on trazodone to control ' fibromyalgia " and chronic insomnia. She wakes always at 4 AM, usually had a bathroom break, can sleep again until 9 or 10 and wakes in pain, never refreshed, never restored - "hurting causes me to wake and my mind is racing and hinders me to go to sleep".   Medical history :  Quoted from Dr. Jannifer Franklin, MD.: Debbie Pineda is a 58 year old right-handed white female with a history of fibromyalgia.  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  The patient apparently has been cleared to operate a motor vehicle.  The patient has been felt to have seizure events, she has been on Topamax which was recently tapered off, but she remains on gabapentin.  The patient has not had any further seizure type events.  The patient is on Aricept for her memory, she has had neuropsychological testing that did not show clear evidence of an Alzheimer's type process.  She has come off the Topamax completely but has not noted any improvement in cognitive functioning.  She has a spinal stimulator in place, she has not gotten much benefit from this, she turned off the stimulator and has had no change in her pain levels.  Recent EMG and nerve conduction study evaluation was unremarkable.  The patient has pain down the left leg.  The patient has recently noted an increase in her headache frequency, she currently is having about 3 a week.  This change occurred about 3 weeks ago.  The patient believe that this may correlate when she came off of the Topamax.  She has had some weight gain as she has come down off of the Topamax as well.  She returns to this office for an evaluation.  This patient had a gastric bypass.     Social history: married to [place] , one daughter of 31years. Disabled since 2008, at the  age of 30.  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  Remote smoker, no ETOH, caffeine ; decaffeinated coffee, tea.     Review of Systems: Out of a complete 14 system review, the patient complains of only the following symptoms, and all other reviewed systems are negative. [name]. Endorsed fatigue, eye pain, easy bruising easy bleeding, feeling hot, feeling cold, snoring, trouble/swallowing dysphoria itching runny nose, aching muscles joint swelling the feeling of decreased energy, changes in appetite, racing thoughts, weakness, headache, memory loss, insomnia, snoring which has been treated with CPAP.  She has a  past medical history of deep venous thrombosis, patent foramen ovale, multiple strokes with residual memory loss and cognitive deficits, hypothyroidism, ADD, seizure x1, arthritis, diabetes, migraine and fibromyalgia.  The patient had taken Ritalin for several years but was asked by her psychiatric provider to agree to wean.  She was changed to Vyvanse which caused severe migraines and had to be discontinued.  She follows Dr. Jannifer Franklin for cognitive deficits stroke care and sees Dr Estanislado Pandy for Fibromyalgia care.  "I can nap at any time "  Epworth Sleepiness score: 16/ 24 , Fatigue severity score 58/ 63  , geriatric depression score; 10/15 points    Social History   Socioeconomic History  . Marital status: Married    Spouse name: Jeneen Rinks  . Number of children: 1  . Years of education: 55  . Highest education level: Not on file  Occupational History  . Occupation: Disability  . Occupation: formerly Therapist, sports, [employer]  Social Needs  . Financial resource strain: Not on file  . Food insecurity:    Worry: Not on file    Inability: Not on file  . Transportation needs:    Medical: Not on file    Non-medical: Not on file  Tobacco Use  . Smoking status: Former Smoker    Packs/day: 0.75    Years: 8.00    Pack years: 6.00    Types: Cigarettes    Last attempt to quit: 12/01/1990    Years since quitting: 27.2  . Smokeless tobacco: Never Used  . Tobacco comment: quit smoking in the 1990s  Substance and Sexual Activity  . Alcohol use: No    Comment: 03/23/2015 "stopped drinking in 2012 w/gastric bypass; drank socially before bypass"  . Drug use: No  . Sexual activity: Not Currently    Birth control/protection: Surgical  Lifestyle  . Physical activity:    Days per week: Not on file    Minutes per session: Not on file  . Stress: Not on file  Relationships  . Social connections:    Talks on phone: Not on file    Gets together: Not on file    Attends religious service: Not on file    Active member  of club or organization: Not on file    Attends meetings of clubs or organizations: Not on file    Relationship status: Not on file  . Intimate partner violence:    Fear of current or ex partner: Not on file    Emotionally abused: Not on file    Physically abused: Not on file    Forced sexual activity: Not on file  Other Topics Concern  . Not on file  Social History Narrative   Lives with husband   Caffeine use: No soda   Mainly water, drinks decaf tea   Right handed    Family History  Problem Relation Age of Onset  . Heart disease  Father   . Cancer Father   . Parkinson's disease Father   . Cancer Mother        skin cancer   . Myasthenia gravis Mother   . Heart disease Brother   . Cancer Brother   . Diabetes Brother   . Stroke Brother   . Heart disease Sister   . Heart attack Sister   . Cancer Maternal Grandfather   . Hypothyroidism Daughter   . Hypertension Other   . Colon cancer Neg Hx     Past Medical History:  Diagnosis Date  . ADD (attention deficit disorder)    takes Adderall daily  . Arthritis    "all over"  . Cerebral infarction ([place]) 10/30/2011  . Cerebrovascular disease 08/14/2016  . Chronic back pain    "all over"  . Chronic low back pain 01/11/2016  . Complication of anesthesia    tends to have hypotension when NPO and post-anesthesia  . Constipation    takes stool softener daily  . Degenerative disk disease   . Degenerative joint disease   . Depression    takes Cymbalta daily for pain per pt  . Diabetes mellitus without complication ([place])   . DVT (deep venous thrombosis) ([facility])    RLE  . Family history of adverse reaction to anesthesia    a family member woke up during surgery; "think it was my mom"  . Fibromyalgia   . Generalized osteoarthritis of multiple sites 11/04/2013  . GERD (gastroesophageal reflux disease)   . Heart palpitations 12/14/2013  . History of blood clots    superficial  . Hypoglycemia   . Hypothyroid    takes Synthroid  daily  . Incomplete emptying of bladder   . Insomnia    takes Trazodone nightly  . Iron deficiency anemia    takes Ferrous Sulfate daily  . Joint pain   . Joint swelling    knees and ankles  . Memory disorder 08/14/2016  . Morbid obesity ([place])   . Nausea    takes Zofran as needed.Seeing GI doc  . Neck pain 05/25/2016  . OSA on CPAP    tested more than 5 yrs ago.    . Osteoarthritis   . PFO (patent foramen ovale)   . Primary osteoarthritis of both feet 05/29/2016   Right bunionectomy August 2017 by Dr. Sharol Given  . Scoliosis   . Sleep apnea   . Stroke [facility]) "several"   right foot weakness; memory issues, black spot right visual field since" (03/23/2015)  . Thrombophlebitis   . Trochanteric bursitis of both hips 05/25/2016  . Unilateral primary osteoarthritis, right knee 05/29/2016  . Urinary urgency 04/19/2016  . Vein disorder 11/29/2010    Past Surgical History:  Procedure Laterality Date  . BONE EXCISION Right 08/29/2017   Procedure: right trapezium excision;  Surgeon: Daryll Brod, MD;  Location: [place];  Service: Orthopedics;  Laterality: Right;  . BUNIONECTOMY Right 08/2015  . CARDIAC CATHETERIZATION     2008.  "it was fine" (not sure why she had it done, and doesn't know where)  . CARPOMETACARPEL SUSPENSION PLASTY Right 08/29/2017   Procedure: SUSPENSION PLASTY RIGHT THUMB;  Surgeon: Daryll Brod, MD;  Location: [place];  Service: Orthopedics;  Laterality: Right;  . COLONOSCOPY N/A 03/25/2013   Procedure: COLONOSCOPY;  Surgeon: Rogene Houston, MD;  [facility];  Service: Endoscopy;  Laterality: N/A;  930  . ESOPHAGOGASTRODUODENOSCOPY    . EXPLORATORY LAPAROTOMY     "  took fallopian tubes out"  . JOINT REPLACEMENT     bil knee   . KNEE ARTHROPLASTY    . KNEE ARTHROSCOPY Left   . KNEE ARTHROSCOPY W/ ACL RECONSTRUCTION Right    "added pins"  . LAPAROSCOPIC CHOLECYSTECTOMY  ~ 2001  . 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 SPINAL CORD  STIMULATOR INSERTION N/A 04/18/2017   Procedure: LUMBAR SPINAL CORD STIMULATOR INSERTION;  Surgeon: Clydell Hakim, MD;  Location: [place];  Service: Neurosurgery;  Laterality: N/A;  LUMBAR SPINAL CORD STIMULATOR INSERTION  . TENDON TRANSFER Right 08/29/2017   Procedure: right abductor pollicis longus transfer;  Surgeon: Daryll Brod, MD;  Location: [place];  Service: Orthopedics;  Laterality: Right;  . TOTAL KNEE ARTHROPLASTY Left 03/23/2015   Procedure: TOTAL KNEE ARTHROPLASTY;  Surgeon: Newt Minion, MD;  Location: [place];  Service: Orthopedics;  Laterality: Left;  . TOTAL KNEE ARTHROPLASTY Right 08/15/2016   Procedure: RIGHT TOTAL KNEE ARTHROPLASTY, REMOVAL ACL SCREWS;  Surgeon: Newt Minion, MD;  Location: [place];  Service: Orthopedics;  Laterality: Right;  . TOTAL KNEE ARTHROPLASTY WITH HARDWARE REMOVAL Right   . VAGINAL HYSTERECTOMY    . VARICOSE VEIN SURGERY Right X 2    Current Outpatient Medications  Medication Sig Dispense Refill  . benzonatate (TESSALON) 200 MG capsule TAKE 1 CAPSULE BY MOUTH THREE TIMES DAILY    . fluconazole (DIFLUCAN) 150 MG tablet     . tapentadol (NUCYNTA) 50 MG 12 hr tablet Take by mouth.    Marland Kitchen amoxicillin (AMOXIL) 500 MG capsule Take 500 mg by mouth. As needed for dental appt. 4caps    . baclofen (LIORESAL) 10 MG tablet Take 10 mg by mouth 3 (three) times daily.   1  . Buprenorphine HCl 600 MCG FILM Place inside cheek.    . Continuous Blood Gluc Sensor (FREESTYLE LIBRE 14 DAY SENSOR) MISC     . Cyanocobalamin (B-12) 5000 MCG SUBL Place 5,000 mcg under the tongue daily at 12 noon. 8 am    . diclofenac sodium (VOLTAREN) 1 % GEL Apply 4 g topically 4 (four) times daily as needed (PAIN). 5 Tube 1  . donepezil (ARICEPT) 10 MG tablet Take 1 tablet (10 mg total) by mouth at bedtime. 30 tablet 1  . DULoxetine (CYMBALTA) 60 MG capsule Take 60 mg by mouth 2 (two) times daily.  11  . folic acid (FOLVITE) 630 MCG tablet Take 400 mcg by mouth daily.    .  Fremanezumab-vfrm (AJOVY) 225 MG/1.5ML SOSY Inject 225 mg into the skin every 30 (thirty) days. 1.5 mL 5  . gabapentin (NEURONTIN) 800 MG tablet Take 1 tablet (800 mg total) by mouth 3 (three) times daily.    Marland Kitchen KRILL OIL PO Take by mouth.    Marland Kitchen L-Methylfolate-B12-B6-B2 (CEREFOLIN) 06-22-48-5 MG TABS Take 1 tablet by mouth daily. 90 each 3  . levothyroxine (SYNTHROID, LEVOTHROID) 125 MCG tablet Take 125 mcg by mouth daily before breakfast.    . mirabegron ER (MYRBETRIQ) 50 MG TB24 tablet Take 50 mg by mouth daily.    . Multiple Minerals-Vitamins (CAL-MAG-ZINC-D PO) Take 1-2 tablets by mouth See admin instructions. Take 2 tablet at 1200 and 1 tablet at 1600  Calcium 1000 mg Vit D 600 iu Magnesium 400 mg Zinc 15 mg    . Multiple Vitamins-Minerals (ONE-A-DAY WOMENS PETITES PO) Take by mouth 2 (two) times daily.    Marland Kitchen NARCAN 4 MG/0.1ML LIQD nasal spray kit Place 1 spray into the nose as  needed (over dose).     Marland Kitchen omeprazole (PRILOSEC) 40 MG capsule Take 1 capsule by mouth twice daily. 60 capsule 2  . ondansetron (ZOFRAN-ODT) 4 MG disintegrating tablet Take 4 mg by mouth every 6 (six) hours as needed for nausea.   2  . rivaroxaban (XARELTO) 20 MG TABS tablet Take 1 tablet (20 mg total) by mouth daily with supper. (Patient taking differently: Take 20 mg by mouth at bedtime. ) 30 tablet 1  . topiramate (TOPAMAX) 50 MG tablet Take 1 tablet (50 mg total) by mouth at bedtime. (Patient taking differently: Take 100 mg by mouth at bedtime. ) 90 tablet 1  . traMADol (ULTRAM) 50 MG tablet 100 mg 4 (four) times daily.   2  . traZODone (DESYREL) 100 MG tablet Take 100-200 mg by mouth at bedtime as needed for sleep (depends on insomnia).     . Vitamin D, Ergocalciferol, (DRISDOL) 50000 UNITS CAPS Take 50,000 Units by mouth every 7 (seven) days. Mondays    . YUVAFEM 10 MCG TABS vaginal tablet      No current facility-administered medications for this visit.     Allergies as of 02/20/2018 - Review Complete 02/20/2018   Allergen Reaction Noted  . Lyrica [pregabalin] Other (See Comments) 12/01/2015  . Flexeril [cyclobenzaprine hcl] Other (See Comments) 10/26/2010  . Morphine and related Itching 10/26/2010  . Sulfamethoxazole-trimethoprim Itching and Rash 03/10/2015  . Belsomra [suvorexant] Other (See Comments) 12/01/2015  . Tape Itching and Rash 03/10/2015    Vitals: BP 109/72   Pulse 71   Ht 5' 5" (1.651 m)   Wt 222 lb (100.7 kg)   BMI 36.94 kg/m  Last Weight:  Wt Readings from Last 1 Encounters:  02/20/18 222 lb (100.7 kg)   GYI:RSWN mass index is 36.94 kg/m.     Last Height:   Ht Readings from Last 1 Encounters:  02/20/18 5' 5" (1.651 m)    Physical exam:  General: The patient is awake, alert and appears not in acute distress. The patient is well groomed. Head: Normocephalic, atraumatic. Neck is supple. Mallampati ,  neck circumference:14". Nasal airflow congested-, post nasal drip.  Mild retrognathia is seen.  Cardiovascular:  Regular rate and rhythm or carotid bruit, and without distended neck veins. Respiratory: Lungs are clear to auscultation. Skin:  Without evidence of edema, or rash Trunk: BMI is 37 kg/ m2. The patient's posture is slumped, stooped.  Neurologic exam : The patient is drowsy, but oriented to place and time.    Memory is followed by PCP( Dr. Jannifer Franklin ?).   MOCA:No flowsheet data found. MMSE: MMSE - Mini Mental State Exam 07/11/2017 01/10/2017 08/14/2016  Orientation to time _0 Orientation to Place _1 Registration _2 Attention/ Calculation _3 Recall _4 Language- name 2 objects _5 Language- repeat _6 Language- follow 3 step command _7 Language- read & follow direction _8 Write a sentence _9 Copy design _10 Total score _11 Attention span & concentration ability appears normal.  Speech is fluent,  with dysphonia or aphasia.  Mood and affect are appropriate.  Cranial nerves: Pupils are un- equal and only  the right is briskly reactive to light. Left is not as reactive,   Hearing to finger rub intact.   Facial sensation intact to fine touch.  Facial  motor strength is symmetric and tongue and uvula move midline.    Assessment:  After physical and neurologic examination, review of laboratory studies,  Personal review of imaging studies, reports of other /same  Imaging studies, results of polysomnography and / or neurophysiology testing and pre-existing records as far as provided in visit., my assessment is:  Patient with history of obstructive sleep apnea, treated with CPAP, I have no access to her baseline sleep study, I also was surprised to see that she has a rather new his machine without auto titration capacity.  She was given is missing about sometime in the last 18 months through [employer].  The settings apparently were set according to her previous older CPAP setting but she was not retested.  But she is a compliant CPAP user and uses the machine 25 out of 30 days over 4 hours with an average of 6 hours and 53 minutes her CPAP is set at 9.6 cmH2O with 3 cm EPR, her face full facemask does not provide any air leak, but her residual AHI is 7.0.  She is using the current liner and her mask for the last 6 months or thereabouts.  She endorses a very high excessive level of daytime sleepiness and fatigue which is partially explained by her comorbidities, her medication, her cerebrovascular disease, her chronic pain condition.  She is using tramadol/ oxycodone as needed.    1)  EDS- related polypharmacy and co-morbidities. Chronic pain and strokes.   2) her OSA is a minor contributor, and I will order a new CPAP titration- this patient is at risk for central apneas induced by encephalopathy and polypharmacy and her current CPAP can't provide these essential data. She needs an attended sleep study.  Current settings do not treat apnea sufficiently !    The patient was advised of the nature  of the diagnosed disorder , the treatment options and the  risks for general health and wellness arising from not treating the condition.   I spent more than 40 minutes of face to face time with the patient.  Greater than 50% of time was spent in counseling and coordination of care. We have discussed the diagnosis and differential and I answered the patient's questions.    Plan:  Treatment plan and additional workup : Attended sleep study under her current medication - evaluate for centrals / obstructive, CO2 retention, hypoxemia.   I will not address pain, mobility , cognitive disorder as this patient has already a primary neurologist, rheumatologist,  psychiatric care and PCP.    Larey Seat, MD 6/96/7893, 8:10 AM  Certified in Neurology by ABPN Certified in [place] by [facility] [address] [place], [place] [address]

## 2018-02-20 NOTE — Telephone Encounter (Signed)
Pt called stated ADD med caused her to have migraines. She stopped med. Asking you to give something else. [facility] [place]

## 2018-02-20 NOTE — Telephone Encounter (Signed)
Pt notified and will bring medication to dispose of.

## 2018-02-20 NOTE — Telephone Encounter (Signed)
Let her know I've sent in a Rx for Concerta but she can't get filled till 2/9 b/c date she got Vyvanse.  She needs to bring the Vyvanse, unused meds to [place] to dispose of properly.

## 2018-02-20 NOTE — Telephone Encounter (Signed)
Please review and advise.

## 2018-02-21 DIAGNOSIS — Z6836 Body mass index (BMI) 36.0-36.9, adult: Secondary | ICD-10-CM | POA: Diagnosis not present

## 2018-02-21 DIAGNOSIS — E063 Autoimmune thyroiditis: Secondary | ICD-10-CM | POA: Diagnosis not present

## 2018-02-21 DIAGNOSIS — Z1389 Encounter for screening for other disorder: Secondary | ICD-10-CM | POA: Diagnosis not present

## 2018-02-21 DIAGNOSIS — I10 Essential (primary) hypertension: Secondary | ICD-10-CM | POA: Diagnosis not present

## 2018-02-21 DIAGNOSIS — Z0001 Encounter for general adult medical examination with abnormal findings: Secondary | ICD-10-CM | POA: Diagnosis not present

## 2018-02-21 DIAGNOSIS — Z9884 Bariatric surgery status: Secondary | ICD-10-CM | POA: Diagnosis not present

## 2018-02-26 ENCOUNTER — Telehealth: Payer: Self-pay | Admitting: Neurology

## 2018-02-26 MED ORDER — TOPIRAMATE 50 MG PO TABS: 150.0000 mg | ORAL_TABLET | Freq: Every day | ORAL | 1 refills | Status: DC

## 2018-02-26 NOTE — Telephone Encounter (Signed)
Pt called wanting to know if her topiramate (TOPAMAX) 50 MG tablet can be increased to 50mg  in the day and 100mg  at night. She is having constant headahces. Pt would like her next refill to be sent to [place]. Please advise.

## 2018-02-26 NOTE — Telephone Encounter (Signed)
I called the patient.  Okay to increase the Topamax to 150 mg daily.  A prescription was sent in.  The patient continues to have headaches on the Ajovy and Topamax.  She was on 100 mg daily of Topamax.

## 2018-02-28 ENCOUNTER — Encounter (INDEPENDENT_AMBULATORY_CARE_PROVIDER_SITE_OTHER): Payer: Self-pay | Admitting: Specialist

## 2018-02-28 ENCOUNTER — Ambulatory Visit (INDEPENDENT_AMBULATORY_CARE_PROVIDER_SITE_OTHER): Payer: Self-pay

## 2018-02-28 ENCOUNTER — Ambulatory Visit (INDEPENDENT_AMBULATORY_CARE_PROVIDER_SITE_OTHER): Payer: PPO | Admitting: Specialist

## 2018-02-28 ENCOUNTER — Telehealth: Payer: Self-pay | Admitting: Cardiovascular Disease

## 2018-02-28 VITALS — BP 100/62 | HR 62 | Ht 65.0 in | Wt 210.0 lb

## 2018-02-28 DIAGNOSIS — M79672 Pain in left foot: Secondary | ICD-10-CM | POA: Diagnosis not present

## 2018-02-28 DIAGNOSIS — M4807 Spinal stenosis, lumbosacral region: Secondary | ICD-10-CM | POA: Diagnosis not present

## 2018-02-28 DIAGNOSIS — M4804 Spinal stenosis, thoracic region: Secondary | ICD-10-CM

## 2018-02-28 DIAGNOSIS — M7672 Peroneal tendinitis, left leg: Secondary | ICD-10-CM | POA: Diagnosis not present

## 2018-02-28 NOTE — Telephone Encounter (Signed)
Patient is calling to see if it is okay for patient to starting Celebrex while taking Xarelto. / tg

## 2018-02-28 NOTE — Progress Notes (Signed)
Office Visit Note   Patient: Debbie Pineda           Date of Birth: [birth year]           MRN: [id number] Visit Date: 02/28/2018              Requested by: Jake Samples, PA-C [address] [place], [place] [address] PCP: Jake Samples, PA-C   Assessment & Plan: Visit Diagnoses:  1. Left foot pain   2. Spinal stenosis of lumbosacral region   3. Peroneal tendonitis, left   4. Spinal stenosis of thoracic region     Plan: Start stretching exercises left foot, use voltaren gel 2 gm applied to the left outer foot. 3-4 times per day. Myelogram and post myelogram CT scan of the thoracolumbar spine ordered.   Follow-Up Instructions: Return in about 3 weeks (around 03/21/2018).   Orders:  Orders Placed This Encounter  Procedures  . CT LUMBAR SPINE W CONTRAST  . XR Foot Complete Left   No orders of the defined types were placed in this encounter.     Procedures: No procedures performed   Clinical Data: No additional findings.   Subjective: Chief Complaint  Patient presents with  . Lower Back - Pain  . Left Elbow - Follow-up    Doing better  . Left Foot - Pain    58 year old female with history of lumbar pain midline L-S junction. She is taking celebrex and this may not be able to use with the xarelto that she takes. Her GI doctor indicates that she may be able to take for a very short period. She has a call into her cardiologisth concerning the use of  Celebrex. She is lying on the left side and notices pain over the lateral left foot at the base of the 5th MT.    Review of Systems  Constitutional: Positive for unexpected weight change.  HENT: Positive for congestion, sinus pressure, sinus pain and sneezing.   Respiratory: Negative for cough, choking, chest tightness, shortness of breath, wheezing and stridor.   Cardiovascular: Negative for chest pain, palpitations and leg swelling.  Gastrointestinal: Negative for abdominal distention, abdominal  pain, anal bleeding, blood in stool, constipation, diarrhea, nausea, rectal pain and vomiting.  Endocrine: Negative for cold intolerance, heat intolerance, polydipsia, polyphagia and polyuria.  Genitourinary: Negative.  Negative for dyspareunia, dysuria, enuresis, flank pain, frequency, genital sores, hematuria and pelvic pain.  Musculoskeletal: Positive for back pain. Negative for arthralgias, gait problem, joint swelling, myalgias, neck pain and neck stiffness.  Skin: Negative for color change, pallor, rash and wound.  Allergic/Immunologic: Negative for environmental allergies, food allergies and immunocompromised state.  Neurological: Positive for weakness and numbness.  Psychiatric/Behavioral: Negative for agitation, behavioral problems, confusion, decreased concentration, dysphoric mood, hallucinations, self-injury, sleep disturbance and suicidal ideas. The patient is not nervous/anxious and is not hyperactive.      Objective: Vital Signs: BP 100/62 (BP Location: Left Arm, Patient Position: Sitting)   Pulse 62   Ht 5\' 5"  (1.651 m)   Wt 210 lb (95.3 kg)   BMI 34.95 kg/m   Physical Exam  Back Exam   Tenderness  The patient is experiencing tenderness in the lumbar.  Muscle Strength  Right Quadriceps:  5/5  Left Quadriceps:  5/5  Right Hamstrings:  5/5       Specialty Comments:  No specialty comments available.  Imaging: No results found.   PMFS History: Patient Active Problem List   Diagnosis Date  Noted  . Attention deficit hyperactivity disorder (ADHD) 01/12/2018  . Insomnia 01/12/2018  . Elevated liver enzymes 09/10/2017  . History of diabetes mellitus 06/06/2017  . Primary osteoarthritis of right hand 06/06/2017  . Transaminasemia   . TIA (transient ischemic attack) 05/01/2017  . Dysphasia 05/01/2017  . Chronic pain syndrome 05/01/2017  . Confusion   . Presence of right artificial knee joint 09/17/2016  . H/O total knee replacement, right 08/15/2016  .  Presence of retained hardware   . Memory disorder 08/14/2016  . Cerebrovascular disease 08/14/2016  . Unilateral primary osteoarthritis, right knee 05/29/2016  . Primary osteoarthritis of both feet 05/29/2016  . Trochanteric bursitis of both hips 05/25/2016  . Neck pain 05/25/2016  . Urinary urgency 04/19/2016  . Chronic low back pain 01/11/2016  . Depression 11/29/2015  . 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 Heart palpitations 12/14/2013  . Generalized osteoarthritis of multiple sites 11/04/2013  . Cerebral infarction ([facility]) 10/30/2011  . PFO (patent foramen ovale) 10/30/2011  . Bradycardia 10/30/2011  . Chest pain 10/30/2011  . Hypothyroid   . Thrombophlebitis   . Sleep apnea   . Fibromyalgia   . 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 Status post bariatric surgery 12/07/2010  . Vein disorder 11/29/2010  . S/P total hysterectomy and bilateral salpingo-oophorectomy 11/29/2010  . S/P exploratory laparotomy 11/29/2010  . S/P cholecystectomy 11/29/2010  . S/P ACL surgery 11/29/2010  . Morbid obesity ([facility]) 11/10/2010   Past Medical History:  Diagnosis Date  . ADD (attention deficit disorder)    takes Adderall daily  . Arthritis    "all over"  . Cerebral infarction ([facility]) 10/30/2011  . Cerebrovascular disease 08/14/2016  . Chronic back pain    "all over"  . Chronic low back pain 01/11/2016  . Complication of anesthesia    tends to have hypotension when NPO and post-anesthesia  . Constipation    takes stool softener daily  . Degenerative disk disease   . Degenerative joint disease   . Depression    takes Cymbalta daily for pain per pt  . Diabetes mellitus without complication ([facility])   . DVT (deep venous thrombosis) ([facility])    RLE  . Family history of adverse reaction to anesthesia    a family member woke up during surgery; "think it was my mom"  . Fibromyalgia   . Generalized osteoarthritis of multiple sites 11/04/2013  . GERD (gastroesophageal reflux  disease)   . Heart palpitations 12/14/2013  . History of blood clots    superficial  . Hypoglycemia   . Hypothyroid    takes Synthroid daily  . Incomplete emptying of bladder   . Insomnia    takes Trazodone nightly  . Iron deficiency anemia    takes Ferrous Sulfate daily  . Joint pain   . Joint swelling    knees and ankles  . Memory disorder 08/14/2016  . Morbid obesity ([facility])   . Nausea    takes Zofran as needed.Seeing GI doc  . Neck pain 05/25/2016  . OSA on CPAP    tested more than 5 yrs ago.    . Osteoarthritis   . PFO (patent foramen ovale)   . Primary osteoarthritis of both feet 05/29/2016   Right bunionectomy August 2017 by Dr. Sharol Given  . Scoliosis   . Sleep apnea   . Stroke [facility]) "several"   right foot weakness; memory issues, black spot right visual field since" (03/23/2015)  . Thrombophlebitis   . Trochanteric bursitis of  both hips 05/25/2016  . Unilateral primary osteoarthritis, right knee 05/29/2016  . Urinary urgency 04/19/2016  . Vein disorder 11/29/2010    Family History  Problem Relation Age of Onset  . Heart disease Father   . Cancer Father   . Parkinson's disease Father   . Cancer Mother        skin cancer   . Myasthenia gravis Mother   . Heart disease Brother   . Cancer Brother   . Diabetes Brother   . Stroke Brother   . Heart disease Sister   . Heart attack Sister   . Cancer Maternal Grandfather   . Hypothyroidism Daughter   . Hypertension Other   . Colon cancer Neg Hx     Past Surgical History:  Procedure Laterality Date  . BONE EXCISION Right 08/29/2017   Procedure: right trapezium excision;  Surgeon: Daryll Brod, MD;  Location: [facility];  Service: Orthopedics;  Laterality: Right;  . BUNIONECTOMY Right 08/2015  . CARDIAC CATHETERIZATION     2008.  "it was fine" (not sure why she had it done, and doesn't know where)  . CARPOMETACARPEL SUSPENSION PLASTY Right 08/29/2017   Procedure: SUSPENSION PLASTY RIGHT THUMB;  Surgeon: Daryll Brod,  MD;  Location: [facility];  Service: Orthopedics;  Laterality: Right;  . COLONOSCOPY N/A 03/25/2013   Procedure: COLONOSCOPY;  Surgeon: Rogene Houston, MD;  [facility];  Service: Endoscopy;  Laterality: N/A;  930  . ESOPHAGOGASTRODUODENOSCOPY    . EXPLORATORY LAPAROTOMY     "took fallopian tubes out"  . JOINT REPLACEMENT     bil knee   . KNEE ARTHROPLASTY    . KNEE ARTHROSCOPY Left   . KNEE ARTHROSCOPY W/ ACL RECONSTRUCTION Right    "added pins"  . LAPAROSCOPIC CHOLECYSTECTOMY  ~ 2001  . 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 SPINAL CORD STIMULATOR INSERTION N/A 04/18/2017   Procedure: LUMBAR SPINAL CORD STIMULATOR INSERTION;  Surgeon: Clydell Hakim, MD;  Location: [facility];  Service: Neurosurgery;  Laterality: N/A;  LUMBAR SPINAL CORD STIMULATOR INSERTION  . TENDON TRANSFER Right 08/29/2017   Procedure: right abductor pollicis longus transfer;  Surgeon: Daryll Brod, MD;  Location: [facility];  Service: Orthopedics;  Laterality: Right;  . TOTAL KNEE ARTHROPLASTY Left 03/23/2015   Procedure: TOTAL KNEE ARTHROPLASTY;  Surgeon: Newt Minion, MD;  Location: [facility];  Service: Orthopedics;  Laterality: Left;  . TOTAL KNEE ARTHROPLASTY Right 08/15/2016   Procedure: RIGHT TOTAL KNEE ARTHROPLASTY, REMOVAL ACL SCREWS;  Surgeon: Newt Minion, MD;  Location: [facility];  Service: Orthopedics;  Laterality: Right;  . TOTAL KNEE ARTHROPLASTY WITH HARDWARE REMOVAL Right   . VAGINAL HYSTERECTOMY    . VARICOSE VEIN SURGERY Right X 2   Social History   Occupational History  . Occupation: Disability  . Occupation: formerly Therapist, sports, [employer]  Tobacco Use  . Smoking status: Former Smoker    Packs/day: 0.75    Years: 8.00    Pack years: 6.00    Types: Cigarettes    Last attempt to quit: 12/01/1990    Years since quitting: 27.2  . Smokeless tobacco: Never Used  . Tobacco comment: quit smoking in the 1990s  Substance and Sexual Activity  . Alcohol use: No    Comment: 03/23/2015  "stopped drinking in 2012 w/gastric bypass; drank socially before bypass"  . Drug use: No  . Sexual activity: Not Currently    Birth control/protection: Surgical

## 2018-02-28 NOTE — Telephone Encounter (Signed)
I would advise against it due to increased risk of bleeding.

## 2018-02-28 NOTE — Telephone Encounter (Signed)
Will forward to Dr. Koneswaran to advise.  

## 2018-02-28 NOTE — Telephone Encounter (Signed)
Pt made aware, voiced understanding. Copy to pcp.  

## 2018-03-04 ENCOUNTER — Telehealth: Payer: Self-pay | Admitting: Nurse Practitioner

## 2018-03-04 DIAGNOSIS — Z0001 Encounter for general adult medical examination with abnormal findings: Secondary | ICD-10-CM | POA: Diagnosis not present

## 2018-03-04 DIAGNOSIS — G4730 Sleep apnea, unspecified: Secondary | ICD-10-CM | POA: Diagnosis not present

## 2018-03-04 DIAGNOSIS — L905 Scar conditions and fibrosis of skin: Secondary | ICD-10-CM | POA: Diagnosis not present

## 2018-03-04 DIAGNOSIS — D225 Melanocytic nevi of trunk: Secondary | ICD-10-CM | POA: Diagnosis not present

## 2018-03-04 DIAGNOSIS — E7849 Other hyperlipidemia: Secondary | ICD-10-CM | POA: Diagnosis not present

## 2018-03-04 DIAGNOSIS — Z08 Encounter for follow-up examination after completed treatment for malignant neoplasm: Secondary | ICD-10-CM | POA: Diagnosis not present

## 2018-03-04 DIAGNOSIS — Z1389 Encounter for screening for other disorder: Secondary | ICD-10-CM | POA: Diagnosis not present

## 2018-03-04 DIAGNOSIS — Z1283 Encounter for screening for malignant neoplasm of skin: Secondary | ICD-10-CM | POA: Diagnosis not present

## 2018-03-04 DIAGNOSIS — Z85828 Personal history of other malignant neoplasm of skin: Secondary | ICD-10-CM | POA: Diagnosis not present

## 2018-03-04 NOTE — Telephone Encounter (Signed)
Phone call to patient to verify medication list and allergies for myelogram procedure. Pt instructed to hold Trazodone, Nucynta, Cymbalta and Concerta for 62IWL prior to myelogram appointment time. Pt also aware she will need to hold Xarelto for 24 hrs, pending approval from Cardiologist Dr. Kate Sable. Pt verbalized understanding. [place] hold request faxed to Dr. Bronson Ing, awaiting response and further recommendations.

## 2018-03-05 ENCOUNTER — Ambulatory Visit: Payer: PPO | Admitting: Adult Health

## 2018-03-10 ENCOUNTER — Telehealth (INDEPENDENT_AMBULATORY_CARE_PROVIDER_SITE_OTHER): Payer: Self-pay | Admitting: Specialist

## 2018-03-10 NOTE — Telephone Encounter (Signed)
Patient called stating the myelogram that Dr Louanne Skye ordered for her will take her off of the pain medicine she is on (Nucynta). Patient said she may have a reaction with the dye and the medication. Also she may have a reaction with any tramadol related medication.  Patient said she advised the pain management PA that she can take any other control substance before during and after for the 4 days. Patient said the PA called and confirmed that she can take the other medication. Patient said the PA told her she can take any over the counter medication. Patient said her GI doctor told her not to take Tylenol because of her liver and Ibuprofen because of her stomach. The number to contact patient is [phone]

## 2018-03-12 ENCOUNTER — Encounter (INDEPENDENT_AMBULATORY_CARE_PROVIDER_SITE_OTHER): Payer: Self-pay | Admitting: *Deleted

## 2018-03-12 ENCOUNTER — Encounter (INDEPENDENT_AMBULATORY_CARE_PROVIDER_SITE_OTHER): Payer: Self-pay | Admitting: Internal Medicine

## 2018-03-12 ENCOUNTER — Ambulatory Visit (INDEPENDENT_AMBULATORY_CARE_PROVIDER_SITE_OTHER): Payer: PPO | Admitting: Internal Medicine

## 2018-03-12 VITALS — BP 108/74 | HR 96 | Temp 98.2°F | Ht 65.0 in | Wt 222.9 lb

## 2018-03-12 DIAGNOSIS — R1012 Left upper quadrant pain: Secondary | ICD-10-CM

## 2018-03-12 DIAGNOSIS — K219 Gastro-esophageal reflux disease without esophagitis: Secondary | ICD-10-CM | POA: Diagnosis not present

## 2018-03-12 NOTE — Progress Notes (Signed)
Subjective:    Patient ID: Debbie Pineda, female    DOB: 10-01-1960, 58 y.o.   MRN: [id number]  HPI Here today for f/u. Last seen in August of 2019. Weight in August 211. Today her weight is 222.6. Hx of chronic GERD and maintained on Omeprazole.  Today she states she is having pain all over. Has been taking Tylenol and Motrin for the pain. She also c/o pain under her left ribs. No injury. Pain worse with bending over. Has been having some soft stool may 3-4 a day. Has not taken anything for this. Soft stools occur about 3 times a week. She is scheduled for a Myelogram tomorrow.    Hx of CVA and maintained on [place]    Review of Systems Past Medical History:  Diagnosis Date  . ADD (attention deficit disorder)    takes Adderall daily  . Arthritis    "all over"  . Cerebral infarction ([facility]) 10/30/2011  . Cerebrovascular disease 08/14/2016  . Chronic back pain    "all over"  . Chronic low back pain 01/11/2016  . Complication of anesthesia    tends to have hypotension when NPO and post-anesthesia  . Constipation    takes stool softener daily  . Degenerative disk disease   . Degenerative joint disease   . Depression    takes Cymbalta daily for pain per pt  . Diabetes mellitus without complication ([place])   . DVT (deep venous thrombosis) ([facility])    RLE  . Family history of adverse reaction to anesthesia    a family member woke up during surgery; "think it was my mom"  . Fibromyalgia   . Generalized osteoarthritis of multiple sites 11/04/2013  . GERD (gastroesophageal reflux disease)   . Heart palpitations 12/14/2013  . History of blood clots    superficial  . Hypoglycemia   . Hypothyroid    takes Synthroid daily  . Incomplete emptying of bladder   . Insomnia    takes Trazodone nightly  . Iron deficiency anemia    takes Ferrous Sulfate daily  . Joint pain   . Joint swelling    knees and ankles  . Memory disorder 08/14/2016  . Morbid obesity ([place])   . Nausea    takes  Zofran as needed.Seeing GI doc  . Neck pain 05/25/2016  . OSA on CPAP    tested more than 5 yrs ago.    . Osteoarthritis   . PFO (patent foramen ovale)   . Primary osteoarthritis of both feet 05/29/2016   Right bunionectomy August 2017 by Dr. Sharol Given  . Scoliosis   . Sleep apnea   . Stroke [facility]) "several"   right foot weakness; memory issues, black spot right visual field since" (03/23/2015)  . Thrombophlebitis   . Trochanteric bursitis of both hips 05/25/2016  . Unilateral primary osteoarthritis, right knee 05/29/2016  . Urinary urgency 04/19/2016  . Vein disorder 11/29/2010    Past Surgical History:  Procedure Laterality Date  . BONE EXCISION Right 08/29/2017   Procedure: right trapezium excision;  Surgeon: Daryll Brod, MD;  Location: [facility];  Service: Orthopedics;  Laterality: Right;  . BUNIONECTOMY Right 08/2015  . CARDIAC CATHETERIZATION     2008.  "it was fine" (not sure why she had it done, and doesn't know where)  . CARPOMETACARPEL SUSPENSION PLASTY Right 08/29/2017   Procedure: SUSPENSION PLASTY RIGHT THUMB;  Surgeon: Daryll Brod, MD;  Location: [facility];  Service: Orthopedics;  Laterality: Right;  .  COLONOSCOPY N/A 03/25/2013   Procedure: COLONOSCOPY;  Surgeon: Rogene Houston, MD;  [facility];  Service: Endoscopy;  Laterality: N/A;  930  . ESOPHAGOGASTRODUODENOSCOPY    . EXPLORATORY LAPAROTOMY     "took fallopian tubes out"  . JOINT REPLACEMENT     bil knee   . KNEE ARTHROPLASTY    . KNEE ARTHROSCOPY Left   . KNEE ARTHROSCOPY W/ ACL RECONSTRUCTION Right    "added pins"  . LAPAROSCOPIC CHOLECYSTECTOMY  ~ 2001  . 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 SPINAL CORD STIMULATOR INSERTION N/A 04/18/2017   Procedure: LUMBAR SPINAL CORD STIMULATOR INSERTION;  Surgeon: Clydell Hakim, MD;  Location: [facility];  Service: Neurosurgery;  Laterality: N/A;  LUMBAR SPINAL CORD STIMULATOR INSERTION  . TENDON TRANSFER Right 08/29/2017   Procedure: right  abductor pollicis longus transfer;  Surgeon: Daryll Brod, MD;  Location: [facility];  Service: Orthopedics;  Laterality: Right;  . TOTAL KNEE ARTHROPLASTY Left 03/23/2015   Procedure: TOTAL KNEE ARTHROPLASTY;  Surgeon: Newt Minion, MD;  Location: [facility];  Service: Orthopedics;  Laterality: Left;  . TOTAL KNEE ARTHROPLASTY Right 08/15/2016   Procedure: RIGHT TOTAL KNEE ARTHROPLASTY, REMOVAL ACL SCREWS;  Surgeon: Newt Minion, MD;  Location: [facility];  Service: Orthopedics;  Laterality: Right;  . TOTAL KNEE ARTHROPLASTY WITH HARDWARE REMOVAL Right   . VAGINAL HYSTERECTOMY    . VARICOSE VEIN SURGERY Right X 2    Allergies  Allergen Reactions  . Lyrica [Pregabalin] Other (See Comments)    SOB, lower extremity edema and weight gain  . Flexeril [Cyclobenzaprine Hcl] Other (See Comments)    Numbness of extremities  . Morphine And Related Itching    Upper torso  . Sulfamethoxazole-Trimethoprim Itching and Rash    Bactrim  . Belsomra [Suvorexant] Other (See Comments)    unknown  . Tape Itching and Rash    Please use "paper" tape Rash if left on longer than 24 hrs    Current Outpatient Medications on File Prior to Visit  Medication Sig Dispense Refill  . baclofen (LIORESAL) 10 MG tablet Take 10 mg by mouth 3 (three) times daily.   1  . diclofenac sodium (VOLTAREN) 1 % GEL Apply 4 g topically 4 (four) times daily as needed (PAIN). 5 Tube 1  . donepezil (ARICEPT) 10 MG tablet Take 1 tablet (10 mg total) by mouth at bedtime. 30 tablet 1  . DULoxetine (CYMBALTA) 60 MG capsule Take 60 mg by mouth 2 (two) times daily.  11  . folic acid (FOLVITE) 017 MCG tablet Take 400 mcg by mouth daily.    . Fremanezumab-vfrm (AJOVY) 225 MG/1.5ML SOSY Inject 225 mg into the skin every 30 (thirty) days. 1.5 mL 5  . gabapentin (NEURONTIN) 800 MG tablet Take 1 tablet (800 mg total) by mouth 3 (three) times daily.    Marland Kitchen KRILL OIL PO Take by mouth.    Marland Kitchen L-Methylfolate-B12-B6-B2 (CEREFOLIN) 06-22-48-5 MG  TABS Take 1 tablet by mouth daily. 90 each 3  . levothyroxine (SYNTHROID, LEVOTHROID) 125 MCG tablet Take 125 mcg by mouth daily before breakfast.    . methylphenidate (CONCERTA) 36 MG PO CR tablet Take 1 tablet (36 mg total) by mouth daily. 30 tablet 0  . mirabegron ER (MYRBETRIQ) 50 MG TB24 tablet Take 50 mg by mouth daily.    . Multiple Minerals-Vitamins (CAL-MAG-ZINC-D PO) Take 1-2 tablets by mouth See admin instructions. Take 2 tablet at 1200 and 1 tablet at 1600  Calcium  1000 mg Vit D 600 iu Magnesium 400 mg Zinc 15 mg    . NARCAN 4 MG/0.1ML LIQD nasal spray kit Place 1 spray into the nose as needed (over dose).     Marland Kitchen omeprazole (PRILOSEC) 40 MG capsule Take 1 capsule by mouth twice daily. 60 capsule 2  . rivaroxaban (XARELTO) 20 MG TABS tablet Take 1 tablet (20 mg total) by mouth daily with supper. (Patient taking differently: Take 20 mg by mouth at bedtime. ) 30 tablet 1  . Tapentadol HCl (NUCYNTA) 100 MG TABS Take by mouth.    . topiramate (TOPAMAX) 50 MG tablet Take 3 tablets (150 mg total) by mouth at bedtime. 270 tablet 1  . traZODone (DESYREL) 100 MG tablet Take 100-200 mg by mouth at bedtime as needed for sleep (depends on insomnia).     . Vitamin D, Ergocalciferol, (DRISDOL) 50000 UNITS CAPS Take 50,000 Units by mouth every 7 (seven) days. Mondays    . YUVAFEM 10 MCG TABS vaginal tablet      No current facility-administered medications on file prior to visit.         Objective:   Physical Exam Blood pressure 108/74, pulse 96, temperature 98.2 F (36.8 C), height 5' 5" (1.651 m), weight 222 lb 14.4 oz (101.1 kg). Alert and oriented. Skin warm and dry. Oral mucosa is moist.   . Sclera anicteric, conjunctivae is pink. Thyroid not enlarged. No cervical lymphadenopathy. Lungs clear. Heart regular rate and rhythm.  Abdomen is soft. Bowel sounds are positive. No hepatomegaly. No abdominal masses felt. No tenderness.  No edema to lower extremities.  .       Assessment & Plan:    GERD. She will continue the Omeprazole.  LUQ pain. Am going to get an  US abdomen. Elevated Liver enzymes. Will get labs from [place]. (recently drawn)

## 2018-03-12 NOTE — Patient Instructions (Addendum)
Continue the Omeprazole. US abdomen OV in 1 year.

## 2018-03-13 ENCOUNTER — Ambulatory Visit (INDEPENDENT_AMBULATORY_CARE_PROVIDER_SITE_OTHER): Payer: PPO | Admitting: Internal Medicine

## 2018-03-13 ENCOUNTER — Ambulatory Visit
Admission: RE | Admit: 2018-03-13 | Discharge: 2018-03-13 | Disposition: A | Discharge status: Home / Self Care | Payer: PPO | Source: Ambulatory Visit | Attending: Specialist | Admitting: Specialist

## 2018-03-13 DIAGNOSIS — M5126 Other intervertebral disc displacement, lumbar region: Secondary | ICD-10-CM | POA: Diagnosis not present

## 2018-03-13 DIAGNOSIS — M4807 Spinal stenosis, lumbosacral region: Secondary | ICD-10-CM

## 2018-03-13 DIAGNOSIS — M4804 Spinal stenosis, thoracic region: Secondary | ICD-10-CM

## 2018-03-13 DIAGNOSIS — M5124 Other intervertebral disc displacement, thoracic region: Secondary | ICD-10-CM | POA: Diagnosis not present

## 2018-03-13 MED ORDER — IOPAMIDOL (ISOVUE-M 300) INJECTION 61%
10.0000 mL | Freq: Once | INTRAMUSCULAR | Status: AC | PRN
  Administered 2018-03-13: 10 mL via INTRATHECAL

## 2018-03-13 MED ORDER — DIAZEPAM 5 MG PO TABS
5.0000 mg | ORAL_TABLET | Freq: Once | ORAL | Status: AC
  Administered 2018-03-13: 5 mg via ORAL

## 2018-03-13 MED ORDER — ONDANSETRON HCL 4 MG/2ML IJ SOLN: 4.0000 mg | Freq: Four times a day (QID) | INTRAMUSCULAR | Status: DC | PRN

## 2018-03-13 NOTE — Discharge Instructions (Signed)
Myelogram Discharge Instructions  1. Go home and rest quietly for the next 24 hours.  It is important to lie flat for the next 24 hours.  Get up only to go to the restroom.  You may lie in the bed or on a couch on your back, your stomach, your left side or your right side.  You may have one pillow under your head.  You may have pillows between your knees while you are on your side or under your knees while you are on your back.  2. DO NOT drive today.  Recline the seat as far back as it will go, while still wearing your seat belt, on the way home.  3. You may get up to go to the bathroom as needed.  You may sit up for 10 minutes to eat.  You may resume your normal diet and medications unless otherwise indicated.  Drink lots of extra fluids today and tomorrow.  4. The incidence of headache, nausea, or vomiting is about 5% (one in 20 patients).  If you develop a headache, lie flat and drink plenty of fluids until the headache goes away.  Caffeinated beverages may be helpful.  If you develop severe nausea and vomiting or a headache that does not go away with flat bed rest, call [phone].  5. You may resume normal activities after your 24 hours of bed rest is over; however, do not exert yourself strongly or do any heavy lifting tomorrow. If when you get up you have a headache when standing, go back to bed and force fluids for another 24 hours.  6. Call your physician for a follow-up appointment.  The results of your myelogram will be sent directly to your physician by the following day.  7. If you have any questions or if complications develop after you arrive home, please call [phone].  Discharge instructions have been explained to the patient.  The patient, or the person responsible for the patient, fully understands these instructions.  YOU MAY RESTART YOUR XARELTO TODAY. YOU MAY RESTART YOUR NUCYNTA, CONCERTA, CYMBALTA AND TRAZODONE TOMORROW 03/14/2018 AT 10:30AM.

## 2018-03-17 NOTE — Telephone Encounter (Signed)
Patient called stating the myelogram that Dr Louanne Skye ordered for her will take her off of the pain medicine she is on (Nucynta). Patient said she may have a reaction with the dye and the medication. Also she may have a reaction with any tramadol related medication.  Patient said she advised the pain management PA that she can take any other control substance before during and after for the 4 days. Patient said the PA called and confirmed that she can take the other medication. Patient said the PA told her she can take any over the counter medication. Patient said her GI doctor told her not to take Tylenol because of her liver and Ibuprofen because of her stomach.

## 2018-03-19 ENCOUNTER — Ambulatory Visit (INDEPENDENT_AMBULATORY_CARE_PROVIDER_SITE_OTHER): Payer: PPO | Admitting: Neurology

## 2018-03-19 DIAGNOSIS — Z79891 Long term (current) use of opiate analgesic: Secondary | ICD-10-CM

## 2018-03-19 DIAGNOSIS — R001 Bradycardia, unspecified: Secondary | ICD-10-CM

## 2018-03-19 DIAGNOSIS — G4719 Other hypersomnia: Secondary | ICD-10-CM

## 2018-03-19 DIAGNOSIS — G4710 Hypersomnia, unspecified: Secondary | ICD-10-CM

## 2018-03-19 DIAGNOSIS — G4737 Central sleep apnea in conditions classified elsewhere: Secondary | ICD-10-CM

## 2018-03-19 DIAGNOSIS — Q211 Atrial septal defect: Secondary | ICD-10-CM

## 2018-03-19 DIAGNOSIS — I6340 Cerebral infarction due to embolism of unspecified cerebral artery: Secondary | ICD-10-CM

## 2018-03-19 DIAGNOSIS — Z9884 Bariatric surgery status: Secondary | ICD-10-CM

## 2018-03-19 DIAGNOSIS — Q2112 Patent foramen ovale: Secondary | ICD-10-CM

## 2018-03-21 DIAGNOSIS — M797 Fibromyalgia: Secondary | ICD-10-CM | POA: Diagnosis not present

## 2018-03-21 DIAGNOSIS — M5136 Other intervertebral disc degeneration, lumbar region: Secondary | ICD-10-CM | POA: Diagnosis not present

## 2018-03-21 DIAGNOSIS — M159 Polyosteoarthritis, unspecified: Secondary | ICD-10-CM | POA: Diagnosis not present

## 2018-03-21 DIAGNOSIS — M47816 Spondylosis without myelopathy or radiculopathy, lumbar region: Secondary | ICD-10-CM | POA: Diagnosis not present

## 2018-03-24 ENCOUNTER — Encounter (INDEPENDENT_AMBULATORY_CARE_PROVIDER_SITE_OTHER): Payer: Self-pay | Admitting: Specialist

## 2018-03-24 ENCOUNTER — Ambulatory Visit (INDEPENDENT_AMBULATORY_CARE_PROVIDER_SITE_OTHER): Payer: PPO | Admitting: Specialist

## 2018-03-24 VITALS — BP 121/77 | Ht 65.0 in | Wt 210.0 lb

## 2018-03-24 DIAGNOSIS — M549 Dorsalgia, unspecified: Secondary | ICD-10-CM

## 2018-03-24 DIAGNOSIS — Z9889 Other specified postprocedural states: Secondary | ICD-10-CM | POA: Diagnosis not present

## 2018-03-24 DIAGNOSIS — M5136 Other intervertebral disc degeneration, lumbar region: Secondary | ICD-10-CM | POA: Diagnosis not present

## 2018-03-24 DIAGNOSIS — M4316 Spondylolisthesis, lumbar region: Secondary | ICD-10-CM | POA: Diagnosis not present

## 2018-03-24 DIAGNOSIS — M51369 Other intervertebral disc degeneration, lumbar region without mention of lumbar back pain or lower extremity pain: Secondary | ICD-10-CM

## 2018-03-24 DIAGNOSIS — M4155 Other secondary scoliosis, thoracolumbar region: Secondary | ICD-10-CM | POA: Diagnosis not present

## 2018-03-24 DIAGNOSIS — Z9689 Presence of other specified functional implants: Secondary | ICD-10-CM

## 2018-03-24 NOTE — Progress Notes (Signed)
Office Visit Note   Patient: Debbie Pineda           Date of Birth: 07/13/1960           MRN: [id number] Visit Date: 03/24/2018              Requested by: Jake Samples, PA-C [address] [place], [place] [address] PCP: Jake Samples, PA-C   Assessment & Plan: Visit Diagnoses:  1. Other secondary scoliosis, thoracolumbar region   2. Degenerative disc disease, lumbar   3. Spondylolisthesis, lumbar region   4. Mechanical back pain   5. Status post insertion of spinal cord stimulator     Plan: Avoid bending, stooping and avoid lifting weights greater than 10 lbs. Avoid prolong standing and walking. Order for a new walker with wheels. Surgery scheduling secretary Kandice Hams, will call you in the next week to schedule for surgery.  Surgery recommended is a 10 level Thoraco-lumbar fusion from T8 to S1 this would be done with rods, screws, hooks and cages with local bone graft and allograft (donor bone graft).  Risk of surgery includes risk of infection 1 in 200 patients, bleeding 10% chance you would need a transfusion.   Risk to the nerves is one in 10,000. You will need to use a brace for 3 months and wean from the brace on the 4th month. Expect improved walking and standing tolerance. Expect relief of leg pain but numbness may persist depending on the length and degree of pressure that has been present.    Follow-Up Instructions: No follow-ups on file.   Orders:  No orders of the defined types were placed in this encounter.  No orders of the defined types were placed in this encounter.     Procedures: No procedures performed   Clinical Data: No additional findings.   Subjective: Chief Complaint  Patient presents with  . Spine - Follow-up    Ct Myelogram review of Thoracic and lumbar spines.    58 year old female with history of back pain and radiation into the left thigh and left leg. She has intermittant pain into the lower back. Pain  is severe and unrelenting. Clinically motor and sensory exam are normal, the results of her    Review of Systems  Constitutional: Negative.   HENT: Negative.   Eyes: Negative.   Respiratory: Negative.   Cardiovascular: Negative.   Gastrointestinal: Negative.   Endocrine: Negative.   Genitourinary: Negative.   Musculoskeletal: Negative.   Skin: Negative.   Allergic/Immunologic: Negative.   Neurological: Negative.   Hematological: Negative.   Psychiatric/Behavioral: Negative.      Objective: Vital Signs: BP 121/77 (BP Location: Left Arm, Patient Position: Sitting)   Ht 5\' 5"  (1.651 m)   Wt 210 lb (95.3 kg)   BMI 34.95 kg/m   Physical Exam Constitutional:      Appearance: She is well-developed.  HENT:     Head: Normocephalic and atraumatic.  Eyes:     Pupils: Pupils are equal, round, and reactive to light.  Neck:     Musculoskeletal: Normal range of motion and neck supple.  Pulmonary:     Effort: Pulmonary effort is normal.     Breath sounds: Normal breath sounds.  Abdominal:     General: Bowel sounds are normal.     Palpations: Abdomen is soft.  Skin:    General: Skin is warm and dry.  Neurological:     Mental Status: She is alert and oriented to  person, place, and time.  Psychiatric:        Behavior: Behavior normal.        Thought Content: Thought content normal.        Judgment: Judgment normal.     Back Exam   Tenderness  The patient is experiencing tenderness in the lumbar.  Range of Motion  Extension: abnormal  Flexion: abnormal  Lateral bend right: abnormal  Lateral bend left: abnormal   Muscle Strength  Right Quadriceps:  5/5  Left Quadriceps:  5/5  Right Hamstrings:  5/5  Left Hamstrings:  5/5   Reflexes  Babinski's sign: normal       Specialty Comments:  No specialty comments available.  Imaging: No results found.   PMFS History: Patient Active Problem List   Diagnosis Date Noted  . Attention deficit hyperactivity  disorder (ADHD) 01/12/2018  . Insomnia 01/12/2018  . Elevated liver enzymes 09/10/2017  . History of diabetes mellitus 06/06/2017  . Primary osteoarthritis of right hand 06/06/2017  . Transaminasemia   . TIA (transient ischemic attack) 05/01/2017  . Dysphasia 05/01/2017  . Chronic pain syndrome 05/01/2017  . Confusion   . Presence of right artificial knee joint 09/17/2016  . H/O total knee replacement, right 08/15/2016  . Presence of retained hardware   . Memory disorder 08/14/2016  . Cerebrovascular disease 08/14/2016  . Unilateral primary osteoarthritis, right knee 05/29/2016  . Primary osteoarthritis of both feet 05/29/2016  . Trochanteric bursitis of both hips 05/25/2016  . Neck pain 05/25/2016  . Urinary urgency 04/19/2016  . Chronic low back pain 01/11/2016  . Depression 11/29/2015  . 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 Heart palpitations 12/14/2013  . Generalized osteoarthritis of multiple sites 11/04/2013  . Cerebral infarction ([facility]) 10/30/2011  . PFO (patent foramen ovale) 10/30/2011  . Bradycardia 10/30/2011  . Chest pain 10/30/2011  . Hypothyroid   . Thrombophlebitis   . Sleep apnea   . Fibromyalgia   . 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 Status post bariatric surgery 12/07/2010  . Vein disorder 11/29/2010  . S/P total hysterectomy and bilateral salpingo-oophorectomy 11/29/2010  . S/P exploratory laparotomy 11/29/2010  . S/P cholecystectomy 11/29/2010  . S/P ACL surgery 11/29/2010  . Morbid obesity ([facility]) 11/10/2010   Past Medical History:  Diagnosis Date  . ADD (attention deficit disorder)    takes Adderall daily  . Arthritis    "all over"  . Cerebral infarction ([facility]) 10/30/2011  . Cerebrovascular disease 08/14/2016  . Chronic back pain    "all over"  . Chronic low back pain 01/11/2016  . Complication of anesthesia    tends to have hypotension when NPO and post-anesthesia  . Constipation    takes stool softener daily  .  Degenerative disk disease   . Degenerative joint disease   . Depression    takes Cymbalta daily for pain per pt  . Diabetes mellitus without complication ([facility])   . DVT (deep venous thrombosis) ([facility])    RLE  . Family history of adverse reaction to anesthesia    a family member woke up during surgery; "think it was my mom"  . Fibromyalgia   . Generalized osteoarthritis of multiple sites 11/04/2013  . GERD (gastroesophageal reflux disease)   . Heart palpitations 12/14/2013  . History of blood clots    superficial  . Hypoglycemia   . Hypothyroid    takes Synthroid daily  . Incomplete emptying of bladder   . Insomnia    takes Trazodone  nightly  . Iron deficiency anemia    takes Ferrous Sulfate daily  . Joint pain   . Joint swelling    knees and ankles  . Memory disorder 08/14/2016  . Morbid obesity ([facility])   . Nausea    takes Zofran as needed.Seeing GI doc  . Neck pain 05/25/2016  . OSA on CPAP    tested more than 5 yrs ago.    . Osteoarthritis   . PFO (patent foramen ovale)   . Primary osteoarthritis of both feet 05/29/2016   Right bunionectomy August 2017 by Dr. Sharol Given  . Scoliosis   . Sleep apnea   . Stroke [facility]) "several"   right foot weakness; memory issues, black spot right visual field since" (03/23/2015)  . Thrombophlebitis   . Trochanteric bursitis of both hips 05/25/2016  . Unilateral primary osteoarthritis, right knee 05/29/2016  . Urinary urgency 04/19/2016  . Vein disorder 11/29/2010    Family History  Problem Relation Age of Onset  . Heart disease Father   . Cancer Father   . Parkinson's disease Father   . Cancer Mother        skin cancer   . Myasthenia gravis Mother   . Heart disease Brother   . Cancer Brother   . Diabetes Brother   . Stroke Brother   . Heart disease Sister   . Heart attack Sister   . Cancer Maternal Grandfather   . Hypothyroidism Daughter   . Hypertension Other   . Colon cancer Neg Hx     Past Surgical History:  Procedure Laterality Date    . BONE EXCISION Right 08/29/2017   Procedure: right trapezium excision;  Surgeon: Daryll Brod, MD;  Location: [place];  Service: Orthopedics;  Laterality: Right;  . BUNIONECTOMY Right 08/2015  . CARDIAC CATHETERIZATION     2008.  "it was fine" (not sure why she had it done, and doesn't know where)  . CARPOMETACARPEL SUSPENSION PLASTY Right 08/29/2017   Procedure: SUSPENSION PLASTY RIGHT THUMB;  Surgeon: Daryll Brod, MD;  Location: [place];  Service: Orthopedics;  Laterality: Right;  . COLONOSCOPY N/A 03/25/2013   Procedure: COLONOSCOPY;  Surgeon: Rogene Houston, MD;  [facility];  Service: Endoscopy;  Laterality: N/A;  930  . ESOPHAGOGASTRODUODENOSCOPY    . EXPLORATORY LAPAROTOMY     "took fallopian tubes out"  . JOINT REPLACEMENT     bil knee   . KNEE ARTHROPLASTY    . KNEE ARTHROSCOPY Left   . KNEE ARTHROSCOPY W/ ACL RECONSTRUCTION Right    "added pins"  . LAPAROSCOPIC CHOLECYSTECTOMY  ~ 2001  . 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 SPINAL CORD STIMULATOR INSERTION N/A 04/18/2017   Procedure: LUMBAR SPINAL CORD STIMULATOR INSERTION;  Surgeon: Clydell Hakim, MD;  Location: [facility];  Service: Neurosurgery;  Laterality: N/A;  LUMBAR SPINAL CORD STIMULATOR INSERTION  . TENDON TRANSFER Right 08/29/2017   Procedure: right abductor pollicis longus transfer;  Surgeon: Daryll Brod, MD;  Location: [facility];  Service: Orthopedics;  Laterality: Right;  . TOTAL KNEE ARTHROPLASTY Left 03/23/2015   Procedure: TOTAL KNEE ARTHROPLASTY;  Surgeon: Newt Minion, MD;  Location: [facility] [facility];  Service: Orthopedics;  Laterality: Left;  . TOTAL KNEE ARTHROPLASTY Right 08/15/2016   Procedure: RIGHT TOTAL KNEE ARTHROPLASTY, REMOVAL ACL SCREWS;  Surgeon: Newt Minion, MD;  Location: [facility];  Service: Orthopedics;  Laterality: Right;  . TOTAL KNEE ARTHROPLASTY WITH HARDWARE REMOVAL Right   .  VAGINAL HYSTERECTOMY    . VARICOSE VEIN SURGERY Right X 2   Social  History   Occupational History  . Occupation: Disability  . Occupation: formerly Therapist, sports, [employer]  Tobacco Use  . Smoking status: Former Smoker    Packs/day: 0.75    Years: 8.00    Pack years: 6.00    Types: Cigarettes    Last attempt to quit: 12/01/1990    Years since quitting: 27.3  . Smokeless tobacco: Never Used  . Tobacco comment: quit smoking in the 1990s  Substance and Sexual Activity  . Alcohol use: No    Comment: 03/23/2015 "stopped drinking in 2012 w/gastric bypass; drank socially before bypass"  . Drug use: No  . Sexual activity: Not Currently    Birth control/protection: Surgical

## 2018-03-25 ENCOUNTER — Telehealth: Payer: Self-pay | Admitting: Neurology

## 2018-03-25 MED ORDER — DONEPEZIL HCL 10 MG PO TABS: 10.0000 mg | ORAL_TABLET | Freq: Every day | ORAL | 1 refills | Status: DC

## 2018-03-25 NOTE — Telephone Encounter (Signed)
Pt request refill for donepezil (ARICEPT) 10 MG tablet sent to [facility].

## 2018-03-25 NOTE — Telephone Encounter (Signed)
Chart reviewed and refill is appropriate. Aricept Rx submitted to [facility] electronically.

## 2018-03-26 ENCOUNTER — Ambulatory Visit (HOSPITAL_COMMUNITY)
Admission: RE | Admit: 2018-03-26 | Discharge: 2018-03-26 | Disposition: A | Discharge status: Home / Self Care | Payer: PPO | Source: Ambulatory Visit | Attending: Internal Medicine | Admitting: Internal Medicine

## 2018-03-26 DIAGNOSIS — R1012 Left upper quadrant pain: Secondary | ICD-10-CM | POA: Diagnosis not present

## 2018-03-26 DIAGNOSIS — N289 Disorder of kidney and ureter, unspecified: Secondary | ICD-10-CM | POA: Diagnosis not present

## 2018-03-27 ENCOUNTER — Telehealth: Payer: Self-pay

## 2018-03-27 NOTE — Procedures (Signed)
PATIENT'S NAME:  Debbie Pineda, Debbie Pineda DOB:      1960/09/18      MR#:    [id number]     DATE OF RECORDING: 03/19/2018 REFERRING M.D.:  Jake Samples, PA-C Study Performed:   Baseline Polysomnogram HISTORY: Debbie Pineda is a 58 y.o. female patient followed at [facility] by Dr. Jannifer Franklin. This referral came from her psychiatric Provider, PA [facility], and is meant to address residual excessive daytime sleepiness in the presence of psychiatric disease, incompletely treated OSA and pain medication induced sleepiness.  She has also cerebrovascular disease and memory loss. Her previous sleep tests were performed at [facility] and over 5 years ago- she has no follow up and gets supplies from [place].     Chief complaint according to patient: "I am using CPAP for over 5 years, and nobody follows up".   Sleep habits are as follows: Dinner time is 6 pm and may have a late evening snack, she goes to bed at 10 PM. Bedroom is cool, quiet and dark always uses CPAP and falls asleep on her left side, on one pillow. She is chronically on trazodone to control ' fibromyalgia "and chronic insomnia. She wakes always at 4 AM, usually had a bathroom break, can sleep again until 9 or 10 and wakes in pain, never refreshed, never restored - "hurting causes me to wake and my mind is racing and hinders me to go to sleep".   HX: ADD, Arthritis, CVA, Cerebrovascular disease, Chronic back pain, Complication of anesthesia, Constipation, Degenerative disk disease, degenerative joint disease, Depression, Diabetes, DVT, Fibromyalgia, GERD, Heart palpitations  The patient endorsed the Epworth Sleepiness Scale at 16/24 points.   The patient's weight 222 pounds with a height of 65 (inches), resulting in a BMI of 37.1 kg/m2. The patient's neck circumference measured 14 inches.  CURRENT MEDICATIONS: Tessalon, Diflucan, Nucynta, Lioresal, Buprenorphine film, B-12, Voltaren gel, Aricept, Cymbalta, Folvite, Ajovy, Neurontin, Krill oil,  Synthroid, Myrbetriq, Multivitamin, Narcane, Prilosec, Zofran, Topamax, Ultram, Desyrel, Vit D, Yuva-fem   PROCEDURE:  This is a multichannel digital polysomnogram utilizing the Somnostar 11.2 system.  Electrodes and sensors were applied and monitored per AASM Specifications.   EEG, EOG, Chin and Limb EMG, were sampled at 200 Hz.  ECG, Snore and Nasal Pressure, Thermal Airflow, Respiratory Effort, CPAP Flow and Pressure, Oximetry was sampled at 50 Hz. Digital video and audio were recorded.      BASELINE STUDY: Lights Out was at 22:20 and Lights On at 05:00.  Total recording time (TRT) was 400 minutes, with a total sleep time (TST) of 346 minutes.   The patient's sleep latency was 3.5 minutes.  REM latency was 0 minutes.  The sleep efficiency was 86.5 %.     SLEEP ARCHITECTURE: WASO (Wake after sleep onset) was 50.5 minutes.  There were 7.5 minutes in Stage N1, 201.5 minutes Stage N2, 137 minutes Stage N3 and 0 minutes in Stage REM.  The percentage of Stage N1 was 2.2%, Stage N2 was 58.2%, Stage N3 was 39.6% and Stage R (REM sleep) was 0%.     RESPIRATORY ANALYSIS:  There were a total of 20 respiratory events:  6 obstructive apneas, 14 hypopneas without respiratory event related arousals (RERAs).      The total APNEA/HYPOPNEA INDEX (AHI) was 3.5 /hour and the total RESPIRATORY DISTURBANCE INDEX was 0. 3.5 /hour.  0 events occurred in REM sleep and 28 events in NREM. The REM AHI was 0. 0 /hour, versus a non-REM AHI of 3.5.  The patient spent 204 minutes of total sleep time in the supine position and 142 minutes in non-supine. The supine AHI was 5.9 versus a non-supine AHI of 0.0.  OXYGEN SATURATION & C02:  The Wake baseline 02 saturation was 92%, with the lowest being 83%. Time spent below 89% saturation equaled 16 minutes.   AROUSALS: The arousals were noted as: 27 were spontaneous, 0 were associated with PLMs, and 5 were associated with respiratory events. The patient had a total of 0 Periodic Limb  Movements.    Audio and video analysis did not show any abnormal or unusual movements, behaviors, phonations or vocalizations. EKG was in keeping with normal sinus rhythm (NSR).   IMPRESSION:  1. The degree of sleep apnea is not "treatworthy" - There is no organic sleep disorder identified.    RECOMMENDATIONS:  No follow up in the sleep clinic is needed. 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      I certify that I have reviewed the entire raw data recording prior to the issuance of this report in accordance with the Standards of Accreditation of the American Academy of Sleep Medicine (AASM)   Larey Seat, MD  03-27-2018 Diplomat, American Board of Psychiatry and Neurology  Diplomat, American Board of [place] Director, [employer] [facility] at [employer]

## 2018-03-27 NOTE — Telephone Encounter (Signed)
I called patient about her split night study. I stated per Dr.Dohmeier there is no organic sleep disorder identified. No follow up is needed in the sleep clinic.For chronic insomnia due pain continue to follow up at the pain clinic. Racing thoughts are a symptom of depression and anxiety. Pt verbalized understanding. Pt will see the NP in 04/2018 for headache follow up. ------

## 2018-03-28 ENCOUNTER — Other Ambulatory Visit (INDEPENDENT_AMBULATORY_CARE_PROVIDER_SITE_OTHER): Payer: Self-pay | Admitting: Internal Medicine

## 2018-03-30 ENCOUNTER — Other Ambulatory Visit: Payer: Self-pay | Admitting: Neurology

## 2018-04-01 ENCOUNTER — Telehealth: Payer: Self-pay | Admitting: Neurology

## 2018-04-01 ENCOUNTER — Other Ambulatory Visit: Payer: Self-pay

## 2018-04-01 MED ORDER — DONEPEZIL HCL 10 MG PO TABS: 10.0000 mg | ORAL_TABLET | Freq: Every day | ORAL | 1 refills | Status: DC

## 2018-04-01 NOTE — Telephone Encounter (Signed)
error 

## 2018-04-03 ENCOUNTER — Telehealth (INDEPENDENT_AMBULATORY_CARE_PROVIDER_SITE_OTHER): Payer: Self-pay | Admitting: Specialist

## 2018-04-03 NOTE — Telephone Encounter (Signed)
Patient called asked which should she put the insert in? Per patient ok to leave voicemail The number to contact patient is [phone]

## 2018-04-04 ENCOUNTER — Encounter: Payer: Self-pay | Admitting: Neurology

## 2018-04-04 ENCOUNTER — Encounter (INDEPENDENT_AMBULATORY_CARE_PROVIDER_SITE_OTHER): Payer: Self-pay

## 2018-04-07 ENCOUNTER — Encounter: Payer: Self-pay | Admitting: Neurology

## 2018-04-07 ENCOUNTER — Telehealth: Payer: Self-pay | Admitting: Physician Assistant

## 2018-04-07 ENCOUNTER — Other Ambulatory Visit: Payer: Self-pay

## 2018-04-07 MED ORDER — METHYLPHENIDATE HCL ER (OSM) 36 MG PO TBCR: 36.0000 mg | EXTENDED_RELEASE_TABLET | Freq: Every day | ORAL | 0 refills | Status: DC

## 2018-04-07 NOTE — Telephone Encounter (Signed)
I looked at her xray of her pelvis and it looks like the left side sits lower than the right.  I called and advised patient to try wearing the lift in the Left shoe.  She said ok

## 2018-04-07 NOTE — Telephone Encounter (Signed)
Pt called need refill on Concerta 36mg  at [facility]

## 2018-04-08 ENCOUNTER — Telehealth: Payer: Self-pay | Admitting: Neurology

## 2018-04-08 ENCOUNTER — Telehealth (INDEPENDENT_AMBULATORY_CARE_PROVIDER_SITE_OTHER): Payer: Self-pay | Admitting: Specialist

## 2018-04-08 DIAGNOSIS — G4737 Central sleep apnea in conditions classified elsewhere: Secondary | ICD-10-CM

## 2018-04-08 DIAGNOSIS — M797 Fibromyalgia: Secondary | ICD-10-CM | POA: Diagnosis not present

## 2018-04-08 DIAGNOSIS — G4719 Other hypersomnia: Secondary | ICD-10-CM

## 2018-04-08 DIAGNOSIS — M5136 Other intervertebral disc degeneration, lumbar region: Secondary | ICD-10-CM | POA: Diagnosis not present

## 2018-04-08 DIAGNOSIS — M159 Polyosteoarthritis, unspecified: Secondary | ICD-10-CM | POA: Diagnosis not present

## 2018-04-08 NOTE — Telephone Encounter (Signed)
Called the patient and I was able to respond to her in reference to her my chart message concern. I called and started over and reviewed with her, her sleep study results in detail. Informed her that based off the average apnea event her average apnea event was only at 3.5 times an hr. I reviewed her oxygen levels throughout the test and her heart rhythm. I advised her that based off this study the diagnosis was that she would not have apnea that would be required to be treated with CPAP machine.   To address her concerns in message she was concerned with her history of strokes about stopping the CPAP therapy. I advised the patient that Dr Brett Fairy is fine with the patient continuing to use the machine that she currently has. She will allow for the scripts for supplies for the patient to be completed. I asked was the patient in need of a new machine and at this time she is not. She was currently set up with the machine July 2018. Pt gets supplies from [place] and is due for supplies for the machine. Informed the patient at this time I will send the order over to [place] to ask for supplies for the patient. If the patient has issues I have asked her to contact me.   In regards to her sleep study when she is eligible for a new machine this study will not be able to allow for her to get a new machine due to the minimal apnea I have asked the patient about medical history and if she finds she has to sleep on her back for medical reasons. Patient states she always wakes up on her back and typically sleeps on back. I advised that typically we would recommend positional therapy to help since her apnea is less when she is on her side. In her study the supine apnea was 5.9. patient states that due to her arthritis history and bilateral hip pain it is hard for her to sleep on her sides. I informed the patient that I would make Dr Dohmeier aware of this so that it can be noted in the study to show  that she has to sleep on her back for medical reasons and that would hopefully help to qualify her in the future when she is due for a cpap since apnea is above 5 in supine.  Pt verbalized understanding. Pt had no questions at this time but was encouraged to call back if questions arise.

## 2018-04-08 NOTE — Telephone Encounter (Signed)
I called and advised that I put her OV note at the front desk for her to pick up

## 2018-04-08 NOTE — Telephone Encounter (Signed)
Patient called asked for a call back concerning her 03/24/2018 office note. Patient want to know if Dr Louanne Skye have completed the office note so she can get a copy so that she can take to her pain management appointment. Patient said she would like to pick up the note today.  The number to contact patient is [phone]

## 2018-04-09 ENCOUNTER — Encounter (INDEPENDENT_AMBULATORY_CARE_PROVIDER_SITE_OTHER): Payer: Self-pay | Admitting: Specialist

## 2018-04-15 ENCOUNTER — Other Ambulatory Visit (INDEPENDENT_AMBULATORY_CARE_PROVIDER_SITE_OTHER): Payer: Self-pay | Admitting: Specialist

## 2018-04-15 DIAGNOSIS — E039 Hypothyroidism, unspecified: Secondary | ICD-10-CM | POA: Diagnosis not present

## 2018-04-18 DIAGNOSIS — R740 Nonspecific elevation of levels of transaminase and lactic acid dehydrogenase [LDH]: Secondary | ICD-10-CM | POA: Diagnosis not present

## 2018-04-18 DIAGNOSIS — E039 Hypothyroidism, unspecified: Secondary | ICD-10-CM | POA: Diagnosis not present

## 2018-04-20 ENCOUNTER — Encounter (INDEPENDENT_AMBULATORY_CARE_PROVIDER_SITE_OTHER): Payer: Self-pay | Admitting: Specialist

## 2018-04-21 ENCOUNTER — Encounter (INDEPENDENT_AMBULATORY_CARE_PROVIDER_SITE_OTHER): Payer: Self-pay | Admitting: Specialist

## 2018-04-22 DIAGNOSIS — M549 Dorsalgia, unspecified: Secondary | ICD-10-CM | POA: Diagnosis not present

## 2018-04-22 DIAGNOSIS — M5136 Other intervertebral disc degeneration, lumbar region: Secondary | ICD-10-CM | POA: Diagnosis not present

## 2018-04-22 DIAGNOSIS — M4316 Spondylolisthesis, lumbar region: Secondary | ICD-10-CM | POA: Diagnosis not present

## 2018-04-28 ENCOUNTER — Encounter (INDEPENDENT_AMBULATORY_CARE_PROVIDER_SITE_OTHER): Payer: Self-pay

## 2018-04-28 ENCOUNTER — Other Ambulatory Visit (INDEPENDENT_AMBULATORY_CARE_PROVIDER_SITE_OTHER): Payer: Self-pay | Admitting: Internal Medicine

## 2018-04-28 ENCOUNTER — Telehealth (INDEPENDENT_AMBULATORY_CARE_PROVIDER_SITE_OTHER): Payer: Self-pay | Admitting: Internal Medicine

## 2018-04-28 ENCOUNTER — Other Ambulatory Visit (INDEPENDENT_AMBULATORY_CARE_PROVIDER_SITE_OTHER): Payer: Self-pay | Admitting: *Deleted

## 2018-04-28 DIAGNOSIS — R748 Abnormal levels of other serum enzymes: Secondary | ICD-10-CM

## 2018-04-28 NOTE — Telephone Encounter (Signed)
Labs have been noted and the patient will be sent a letter as a reminder.

## 2018-04-28 NOTE — Telephone Encounter (Signed)
Tammy, Hepatic in 3 months. Please send letter

## 2018-04-29 ENCOUNTER — Encounter: Payer: Self-pay | Admitting: Physician Assistant

## 2018-04-29 ENCOUNTER — Ambulatory Visit (INDEPENDENT_AMBULATORY_CARE_PROVIDER_SITE_OTHER): Payer: PPO | Admitting: Physician Assistant

## 2018-04-29 ENCOUNTER — Other Ambulatory Visit: Payer: Self-pay

## 2018-04-29 DIAGNOSIS — F3340 Major depressive disorder, recurrent, in remission, unspecified: Secondary | ICD-10-CM

## 2018-04-29 DIAGNOSIS — G4700 Insomnia, unspecified: Secondary | ICD-10-CM

## 2018-04-29 DIAGNOSIS — Z79899 Other long term (current) drug therapy: Secondary | ICD-10-CM

## 2018-04-29 DIAGNOSIS — R413 Other amnesia: Secondary | ICD-10-CM

## 2018-04-29 DIAGNOSIS — F900 Attention-deficit hyperactivity disorder, predominantly inattentive type: Secondary | ICD-10-CM

## 2018-04-29 MED ORDER — METHYLPHENIDATE HCL ER (OSM) 27 MG PO TBCR: EXTENDED_RELEASE_TABLET | ORAL | 0 refills | Status: DC

## 2018-04-29 NOTE — Progress Notes (Signed)
[facility]  Patient ID: Debbie Pineda,  MRN: [id number]  PCP: Jake Samples, PA-C  Date of Evaluation: 04/29/2018 Time spent:15 minutes  Chief Complaint:  Chief Complaint    Follow-up     Virtual Visit via Telephone Note  I connected with Debbie Pineda on 04/29/18 at 11:00 AM EDT by telephone and verified that I am speaking with the correct person using two identifiers.   I discussed the limitations, risks, security and privacy concerns of performing an evaluation and management service by telephone and the availability of in person appointments. I also discussed with the patient that there may be a patient responsible charge related to this service. The patient expressed understanding and agreed to proceed.    HISTORY/CURRENT STATUS: HPI Mom also on phone, Artis Delay.   Her brother died on 05-24-2018.  "Had a heart attack. I'm ok right now.  That was the second sibling of lost in 83 months.  I am not real depressed or anxious right now but it might hit me later."  At the last visit, we added Vyvanse.  She was not able to take that due to insurance reasons.  We then started Concerta.  She states she is feeling much better but it does not last as long as she would like for it to.  It is generally lasting 6 hours or so.  She does feel more energetic and more motivated.  States she has felt so "down" for so long, that it feels good to have more pep and not be so low.  Patient denies loss of interest in usual activities and is able to enjoy things.  Denies decreased energy or motivation.  Appetite has not changed.  No extreme sadness, tearfulness, or feelings of hopelessness.  Denies any changes in concentration, making decisions or remembering things.  Denies suicidal or homicidal thoughts.  Patient denies increased energy with decreased need for sleep, no increased talkativeness, no racing thoughts, no impulsivity or risky behaviors, no increased spending, no  increased libido, no grandiosity.  Denies increased anxiety right now although she is concerned about coronavirus issues.  She has moved in with her elderly mother for now, trying to help take care of her.  "If my nerves get bad should I call you or call my PCP?"  Individual Medical History/ Review of Systems: Changes? :No   Allergies: Lyrica [pregabalin]; Flexeril [cyclobenzaprine hcl]; Belsomra [suvorexant]; Morphine and related; Sulfamethoxazole-trimethoprim; and Tape  Current Medications:  Current Outpatient Medications:  .  baclofen (LIORESAL) 10 MG tablet, Take 10 mg by mouth 3 (three) times daily. , Disp: , Rfl: 1 .  diclofenac sodium (VOLTAREN) 1 % GEL, Apply 4 g topically 4 (four) times daily as needed (PAIN)., Disp: 5 Tube, Rfl: 1 .  donepezil (ARICEPT) 10 MG tablet, Take 1 tablet (10 mg total) by mouth at bedtime., Disp: 90 tablet, Rfl: 1 .  DULoxetine (CYMBALTA) 60 MG capsule, Take 60 mg by mouth 2 (two) times daily., Disp: , Rfl: 11 .  folic acid (FOLVITE) 350 MCG tablet, Take 400 mcg by mouth daily., Disp: , Rfl:  .  folic acid (FOLVITE) 093 MCG tablet, Take 400 mcg by mouth daily., Disp: , Rfl:  .  Fremanezumab-vfrm (AJOVY) 225 MG/1.5ML SOSY, Inject 225 mg into the skin every 30 (thirty) days., Disp: 1.5 mL, Rfl: 5 .  Fremanezumab-vfrm (AJOVY) 225 MG/1.5ML SOSY, Inject into the skin. Every 30 days., Disp: , Rfl:  .  gabapentin (NEURONTIN) 800 MG tablet, Take  1 tablet (800 mg total) by mouth 3 (three) times daily., Disp: , Rfl:  .  KRILL OIL PO, Take by mouth., Disp: , Rfl:  .  L-Methylfolate-B12-B6-B2 (CEREFOLIN) 06-22-48-5 MG TABS, Take 1 tablet by mouth daily., Disp: 90 each, Rfl: 3 .  levothyroxine (SYNTHROID, LEVOTHROID) 125 MCG tablet, Take 150 mcg by mouth daily before breakfast. , Disp: , Rfl:  .  mirabegron ER (MYRBETRIQ) 50 MG TB24 tablet, Take 50 mg by mouth daily., Disp: , Rfl:  .  Multiple Minerals-Vitamins (CAL-MAG-ZINC-D PO), Take 1-2 tablets by mouth See admin  instructions. Take 2 tablet at 1200 and 1 tablet at 1600  Calcium 1000 mg Vit D 600 iu Magnesium 400 mg Zinc 15 mg, Disp: , Rfl:  .  NARCAN 4 MG/0.1ML LIQD nasal spray kit, Place 1 spray into the nose as needed (over dose). , Disp: , Rfl:  .  NONFORMULARY OR COMPOUNDED ITEM, Calcium, Magnesium and Zinc, D3 Take 3 calcium 1024m, Magnesium 4022m, Zinc 1527mVitamin D3 600 IU daily, Disp: , Rfl:  .  omeprazole (PRILOSEC) 40 MG capsule, Take 1 capsule by mouth twice daily., Disp: 60 capsule, Rfl: 5 .  rivaroxaban (XARELTO) 20 MG TABS tablet, Take 1 tablet (20 mg total) by mouth daily with supper. (Patient taking differently: Take 20 mg by mouth at bedtime. ), Disp: 30 tablet, Rfl: 1 .  tapentadol (NUCYNTA) 50 MG tablet, Take by mouth., Disp: , Rfl:  .  topiramate (TOPAMAX) 50 MG tablet, Take 3 tablets (150 mg total) by mouth at bedtime., Disp: 270 tablet, Rfl: 1 .  traZODone (DESYREL) 100 MG tablet, Take 150 mg by mouth at bedtime as needed for sleep (depends on insomnia). , Disp: , Rfl:  .  vitamin B-12 (CYANOCOBALAMIN) 1000 MCG tablet, Take 2,500 mcg by mouth daily., Disp: , Rfl:  .  Vitamin D, Ergocalciferol, (DRISDOL) 50000 UNITS CAPS, Take 50,000 Units by mouth every 7 (seven) days. Mondays, Disp: , Rfl:  .  YUVAFEM 10 MCG TABS vaginal tablet, , Disp: , Rfl:  .  methylphenidate (CONCERTA) 27 MG PO CR tablet, 1 po by 9:00 am, then repeat by 1 pm, Disp: 60 tablet, Rfl: 0 .  Tapentadol HCl (NUCYNTA) 100 MG TABS, Take by mouth., Disp: , Rfl:  Medication Side Effects: none  Family Medical/ Social History: Changes? Yes see above  MENTAL HEALTH EXAM:  There were no vitals taken for this visit.There is no height or weight on file to calculate BMI.  General Appearance: Phone visit, unable to assess  Eye Contact:  Unable to assess  Speech:  Clear and Coherent  Volume:  Normal  Mood:  Euthymic  Affect:  Unable to assess  Thought Process:  Goal Directed  Orientation:  Full (Time, Place, and Person)   Thought Content: Logical   Suicidal Thoughts:  No  Homicidal Thoughts:  No  Memory:  Recent;   Fair  Judgement:  Good  Insight:  Good  Psychomotor Activity:  Unable to assess  Concentration:  Concentration: Fair and Attention Span: Fair  Recall:  Good  Fund of Knowledge: Good  Language: Good  Assets:  Desire for Improvement  ADL's:  Intact  Cognition: WNL  Prognosis:  Good    DIAGNOSES:    ICD-10-CM   1. Attention deficit hyperactivity disorder (ADHD), predominantly inattentive type F90.0   2. Insomnia, unspecified type G47.00   3. Recurrent major depressive disorder, in remission (HCCCollins33.40   4. Memory disorder R41.3   5. Polypharmacy Z79.899  Receiving Psychotherapy: No    RECOMMENDATIONS: Change Concerta dose to 27 mg p.o. every morning and then can repeat 1 p.o. around noon to 1 PM.  Do not take later than that as it may keep her up at night. Continue multivitamin, B complex, vitamin D, calcium, fish oil, If she does have more anxiety, she can contact me and we will send in hydroxyzine.  She is aware that she cannot take the benzo with a stimulant. Return in 4 to 6 weeks.  Donnal Moat, PA-C   This record has been created using [employer].  Chart creation errors have been sought, but may not always have been located and corrected. Such creation errors do not reflect on the standard of medical care.

## 2018-05-05 DIAGNOSIS — M47816 Spondylosis without myelopathy or radiculopathy, lumbar region: Secondary | ICD-10-CM | POA: Diagnosis not present

## 2018-05-05 DIAGNOSIS — M5136 Other intervertebral disc degeneration, lumbar region: Secondary | ICD-10-CM | POA: Diagnosis not present

## 2018-05-05 DIAGNOSIS — M159 Polyosteoarthritis, unspecified: Secondary | ICD-10-CM | POA: Diagnosis not present

## 2018-05-05 DIAGNOSIS — M797 Fibromyalgia: Secondary | ICD-10-CM | POA: Diagnosis not present

## 2018-05-07 ENCOUNTER — Telehealth: Payer: Self-pay

## 2018-05-07 NOTE — Telephone Encounter (Signed)
Spoke with the patient and she has given verbal consent to do a webex visit and to file her insurance. email has been confirmed.

## 2018-05-08 DIAGNOSIS — G4733 Obstructive sleep apnea (adult) (pediatric): Secondary | ICD-10-CM | POA: Diagnosis not present

## 2018-05-12 ENCOUNTER — Other Ambulatory Visit: Payer: Self-pay

## 2018-05-12 ENCOUNTER — Encounter: Payer: Self-pay | Admitting: Neurology

## 2018-05-12 ENCOUNTER — Ambulatory Visit (INDEPENDENT_AMBULATORY_CARE_PROVIDER_SITE_OTHER): Payer: PPO | Admitting: Neurology

## 2018-05-12 ENCOUNTER — Ambulatory Visit: Payer: PPO | Admitting: Neurology

## 2018-05-12 DIAGNOSIS — R413 Other amnesia: Secondary | ICD-10-CM

## 2018-05-12 DIAGNOSIS — G40909 Epilepsy, unspecified, not intractable, without status epilepticus: Secondary | ICD-10-CM

## 2018-05-12 DIAGNOSIS — G43609 Persistent migraine aura with cerebral infarction, not intractable, without status migrainosus: Secondary | ICD-10-CM

## 2018-05-12 DIAGNOSIS — I639 Cerebral infarction, unspecified: Secondary | ICD-10-CM | POA: Diagnosis not present

## 2018-05-12 MED ORDER — FREMANEZUMAB-VFRM 225 MG/1.5ML ~~LOC~~ SOSY: 225.0000 mg | PREFILLED_SYRINGE | SUBCUTANEOUS | 5 refills | Status: DC

## 2018-05-12 MED ORDER — CEREFOLIN 6-1-50-5 MG PO TABS: 1.0000 | ORAL_TABLET | Freq: Every day | ORAL | 3 refills | Status: DC

## 2018-05-12 MED ORDER — TOPIRAMATE 100 MG PO TABS: 100.0000 mg | ORAL_TABLET | Freq: Two times a day (BID) | ORAL | 5 refills | Status: DC

## 2018-05-12 NOTE — Addendum Note (Signed)
Addended by: Suzzanne Cloud on: 05/12/2018 01:40 PM   Modules accepted: Level of Service

## 2018-05-12 NOTE — Progress Notes (Signed)
I have read the note, and I agree with the clinical assessment and plan.  Miki Labuda K Javari Bufkin   

## 2018-05-12 NOTE — Progress Notes (Deleted)
Virtual Visit via Video Note  I connected with Debbie Pineda on 05/12/18 at 11:15 AM EDT by a video enabled telemedicine application and verified that I am speaking with the correct person using two identifiers.   I discussed the limitations of evaluation and management by telemedicine and the availability of in person appointments. The patient expressed understanding and agreed to proceed.  Had to convert to telephone visit, she was not able to download necessary applications.   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  She is currently taking the Ajovy, Topamax was increased, taking 50 mg in the morning, 100 mg at bedtime.  She had a sleep evaluation in February 2020, no organic sleep disorder was identified.  She was already using a CPAP machine prior to the evaluation, decided she wanted to continue using.  She reports over the last month she has been having more headaches, having about 3 to 4/week.  She denies timeline has been a trigger, for the last few weeks she has been working outside.  She denies any seizure events.  She reports she did recently lose her brother in the last month, she is staying with her mother.  She reports continued difficulties with her short-term memory.  Overall she feels her memory is stable, denies any changes.  She describes her typical as headache is starting on the left side of her head, generally can bedside, lying down in a dark room, and the headache will subside.  She has to take Excedrin Migraine about once per week.  She denies any nausea or vomiting with her headaches.  She reports her headaches that improved whenever she increased her dose of Topamax, in the past she has been on Topamax 100 mg twice a day.   01/08/18 Dr. Jannifer Franklin: Ms.  Pineda is a 58 year old right-handed white female with a history of fibromyalgia.  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  The patient apparently has been cleared to operate a motor vehicle.  The patient has been felt to have seizure events, she has been on Topamax which was recently tapered off, but she remains on gabapentin.  The patient has not had any further seizure type events.  The patient is on Aricept for her memory, she has had neuropsychological testing that did not show clear evidence of an Alzheimer's type process.  She has come off the Topamax completely but has not noted any improvement in cognitive functioning.  She has a spinal stimulator in place, she has not gotten much benefit from this, she turned off the stimulator and has had no change in her pain levels.  Recent EMG and nerve conduction study evaluation was unremarkable.  The patient has pain down the left leg.  The patient has recently noted an increase in her headache frequency, she currently is having about 3 a week.  This change occurred about 3 weeks ago.  The patient believe that this may correlate when she came off of the Topamax.  She has had some weight gain as she has come down off of the Topamax as well.  She returns to this office for an evaluation.   Observations/Objective: Alert, answers questions appropriately, speech is clear and concise, knowledgeable of health condition  Assessment and Plan: 1.  Migraine headache 2.  Seizures 3.  Memory disturbance 4. History of stroke  She reports over the last month she has had an increase in her headaches, because she has been outside, and in direct sunlight.  She is asking to increase her Topamax, 100 mg twice a day.  We discussed side effects of the medication, I will send in a prescription for this.  She has been on this in the past and was able to tolerate.  She will continue  taking Ajovy.  We discussed trying to avoid triggers.    Follow Up Instructions: 6 months for revisit   I discussed the assessment and treatment plan with the patient. The patient was provided an opportunity to ask questions and all were answered. The patient agreed with the plan and demonstrated an understanding of the instructions.   The patient was advised to call back or seek an in-person evaluation if the symptoms worsen or if the condition fails to improve as anticipated.  I provided 25 minutes of non-face-to-face time during this encounter.   Evangeline Dakin, DNP  [facility] [address] [place], [place] [address] [phone]

## 2018-05-12 NOTE — Progress Notes (Signed)
Virtual Visit via Video Note  I connected with Debbie Pineda on 05/12/18 at 11:15 AM EDT by a video enabled telemedicine application and verified that I am speaking with the correct person using two identifiers.   I discussed the limitations of evaluation and management by telemedicine and the availability of in person appointments. The patient expressed understanding and agreed to proceed.  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  She is currently taking Topamax 150 mg daily, Ajovy monthly injection.  She is currently taking Aricept for memory disturbance.  She did have a neuropsychological evaluation that did not show clear evidence of Alzheimer's disease. She reports her memory has been stable, continues to have some difficulty with short-term memory. She does report that her migraines have increased over the last month.  She identifies sunlight as a trigger, has been working outside over the last month.  She recently lost her brother, has been staying with her mother.  She reports she will have about 3 headaches per week.  She describes her headaches as occurring on the left side.  When she gets a headache she will go inside, lie down in a dark room and headache will improve.  She has to take Excedrin Migraine about once a week. 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  She has not had any recent seizure events.  He did recently have a sleep evaluation with Dr. Westley Hummer.  No organic sleep disorder was identified, she decided to continue using her CPAP given her history of stroke.   01/08/18 Dr. Jannifer Franklin:  Debbie Pineda is a 58 year old  right-handed white female with a history of fibromyalgia.  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  The patient apparently has been cleared to operate a motor vehicle.  The patient has been felt to have seizure events, she has been on Topamax which was recently tapered off, but she remains on gabapentin.  The patient has not had any further seizure type events.  The patient is on Aricept for her memory, she has had neuropsychological testing that did not show clear evidence of an Alzheimer's type process.  She has come off the Topamax completely but has not noted any improvement in cognitive functioning.  She has a spinal stimulator in place, she has not gotten much benefit from this, she turned off the stimulator and has had no change in her pain levels.  Recent EMG and nerve conduction study evaluation was unremarkable.  The patient has pain down the left leg.  The patient has recently noted an increase in her headache frequency, she currently is having about 3 a week.  This change occurred about 3 weeks ago.  The patient believe that this may correlate when she came off of the Topamax.  She has had some weight gain as she has come down off of the Topamax as well.  She returns to this office for an evaluation.  Observations/Objective: Alert, answers questions appropriately, knowledgeable of health condition   Assessment and Plan: 1.  Seizures 2.  Migraine headaches 3.  History of Stroke 4.  Memory disturbance  She reports over the last month her headaches have increased in frequency, due to working outside, being in direct sunlight.  She is asking to increase her Topamax 100 mg twice a day.  She has been on this dose in the past, it was beneficial.  I will send in a refill for this.  We discussed side effects of medication. She will continue using Ajovy monthly injection.  I will send in a refill for  Cerefolin, she has a MTHFR enzyme mutation.  She will try to avoid her migraine triggers.  I advised her symptoms worsen or she develops any new symptoms she should let us know.  Prior to this month, her headaches have been doing much better.  Follow Up Instructions: 6 months for revisit    I discussed the assessment and treatment plan with the patient. The patient was provided an opportunity to ask questions and all were answered. The patient agreed with the plan and demonstrated an understanding of the instructions.   The patient was advised to call back or seek an in-person evaluation if the symptoms worsen or if the condition fails to improve as anticipated.  I provided 25 minutes of non-face-to-face time during this encounter.   Evangeline Dakin, DNP  [facility] [address] [place], [place] [address] [phone]

## 2018-05-29 ENCOUNTER — Encounter: Payer: Self-pay | Admitting: Physician Assistant

## 2018-05-29 ENCOUNTER — Ambulatory Visit (INDEPENDENT_AMBULATORY_CARE_PROVIDER_SITE_OTHER): Payer: PPO | Admitting: Physician Assistant

## 2018-05-29 ENCOUNTER — Other Ambulatory Visit: Payer: Self-pay

## 2018-05-29 DIAGNOSIS — F3340 Major depressive disorder, recurrent, in remission, unspecified: Secondary | ICD-10-CM

## 2018-05-29 DIAGNOSIS — R413 Other amnesia: Secondary | ICD-10-CM

## 2018-05-29 DIAGNOSIS — G4700 Insomnia, unspecified: Secondary | ICD-10-CM

## 2018-05-29 DIAGNOSIS — Z79899 Other long term (current) drug therapy: Secondary | ICD-10-CM | POA: Diagnosis not present

## 2018-05-29 DIAGNOSIS — F900 Attention-deficit hyperactivity disorder, predominantly inattentive type: Secondary | ICD-10-CM

## 2018-05-29 MED ORDER — METHYLPHENIDATE HCL ER (OSM) 27 MG PO TBCR: 27.0000 mg | EXTENDED_RELEASE_TABLET | Freq: Two times a day (BID) | ORAL | 0 refills | Status: DC

## 2018-05-29 MED ORDER — METHYLPHENIDATE HCL ER (OSM) 27 MG PO TBCR: 27.0000 mg | EXTENDED_RELEASE_TABLET | ORAL | 0 refills | Status: DC

## 2018-05-29 NOTE — Progress Notes (Signed)
[facility]  Patient ID: Debbie Pineda,  MRN: [id number]  PCP: Jake Samples, PA-C  Date of Evaluation: 05/29/2018 Time spent:15 minutes  Chief Complaint:  Chief Complaint    ADD; Follow-up     Virtual Visit via Telephone Note  I connected with patient by a video enabled telemedicine application or telephone, with their informed consent, and verified patient privacy and that I am speaking with the correct person using two identifiers.  I am private, in my home and the patient is home.  I discussed the limitations, risks, security and privacy concerns of performing an evaluation and management service by telephone and the availability of in person appointments. I also discussed with the patient that there may be a patient responsible charge related to this service. The patient expressed understanding and agreed to proceed.   I discussed the assessment and treatment plan with the patient. The patient was provided an opportunity to ask questions and all were answered. The patient agreed with the plan and demonstrated an understanding of the instructions.   The patient was advised to call back or seek an in-person evaluation if the symptoms worsen or if the condition fails to improve as anticipated.  I provided 15 minutes of non-face-to-face time during this encounter.  HISTORY/CURRENT STATUS: HPI For routine f/u.  At [place], we added an afternoon dose of Concerta. States it has done a 'world of good.'  Not as scattered, and is able get more things done. It's made her less depressed too.  She is more able to enjoy things.  It has helped with her energy and motivation.  She is not isolating except because of the coronavirus pandemic shelter in place orders.  She does not cry easily.  She continues to stay with her mother to help her out.  Otherwise she is not working.  She sleeps well with the trazodone.  Denies dizziness, syncope, seizures, numbness, tingling, tremor,  tics, unsteady gait, slurred speech, confusion. Denies muscle or joint pain, stiffness, or dystonia.  States her memory is about the same.  Individual Medical History/ Review of Systems: Changes? :No    Past medications for mental health diagnoses include: Adderall, Concerta, Lunesta, Ambien, Trazodone, Belsomra, Benadryl, Melatonin, Sonata,Celexa, Xanax, Ritalin, Topamax, Cymbalta, Aricept   Allergies: Lyrica [pregabalin]; Flexeril [cyclobenzaprine hcl]; Belsomra [suvorexant]; Morphine and related; Sulfamethoxazole-trimethoprim; and Tape  Current Medications:  Current Outpatient Medications:  .  baclofen (LIORESAL) 10 MG tablet, Take 10 mg by mouth 3 (three) times daily. , Disp: , Rfl: 1 .  diclofenac sodium (VOLTAREN) 1 % GEL, Apply 4 g topically 4 (four) times daily as needed (PAIN)., Disp: 5 Tube, Rfl: 1 .  donepezil (ARICEPT) 10 MG tablet, Take 1 tablet (10 mg total) by mouth at bedtime., Disp: 90 tablet, Rfl: 1 .  DULoxetine (CYMBALTA) 60 MG capsule, Take 60 mg by mouth 2 (two) times daily., Disp: , Rfl: 11 .  folic acid (FOLVITE) 888 MCG tablet, Take 400 mcg by mouth daily., Disp: , Rfl:  .  folic acid (FOLVITE) 280 MCG tablet, Take 400 mcg by mouth daily., Disp: , Rfl:  .  Fremanezumab-vfrm (AJOVY) 225 MG/1.5ML SOSY, Inject into the skin. Every 30 days., Disp: , Rfl:  .  Fremanezumab-vfrm (AJOVY) 225 MG/1.5ML SOSY, Inject 225 mg into the skin every 30 (thirty) days., Disp: 1.5 mL, Rfl: 5 .  gabapentin (NEURONTIN) 800 MG tablet, Take 1 tablet (800 mg total) by mouth 3 (three) times daily., Disp: , Rfl:  .  KRILL OIL PO, Take by mouth., Disp: , Rfl:  .  L-Methylfolate-B12-B6-B2 (CEREFOLIN) 06-22-48-5 MG TABS, Take 1 tablet by mouth daily., Disp: 90 each, Rfl: 3 .  levothyroxine (SYNTHROID, LEVOTHROID) 125 MCG tablet, Take 150 mcg by mouth daily before breakfast. , Disp: , Rfl:  .  methylphenidate (CONCERTA) 27 MG PO CR tablet, 1 po by 9:00 am, then repeat by 1 pm, Disp: 60 tablet,  Rfl: 0 .  mirabegron ER (MYRBETRIQ) 50 MG TB24 tablet, Take 50 mg by mouth daily., Disp: , Rfl:  .  Multiple Minerals-Vitamins (CAL-MAG-ZINC-D PO), Take 1-2 tablets by mouth See admin instructions. Take 2 tablet at 1200 and 1 tablet at 1600  Calcium 1000 mg Vit D 600 iu Magnesium 400 mg Zinc 15 mg, Disp: , Rfl:  .  NARCAN 4 MG/0.1ML LIQD nasal spray kit, Place 1 spray into the nose as needed (over dose). , Disp: , Rfl:  .  NONFORMULARY OR COMPOUNDED ITEM, Calcium, Magnesium and Zinc, D3 Take 3 calcium 1040m, Magnesium 4044m, Zinc 1573mVitamin D3 600 IU daily, Disp: , Rfl:  .  omeprazole (PRILOSEC) 40 MG capsule, Take 1 capsule by mouth twice daily., Disp: 60 capsule, Rfl: 5 .  rivaroxaban (XARELTO) 20 MG TABS tablet, Take 1 tablet (20 mg total) by mouth daily with supper. (Patient taking differently: Take 20 mg by mouth at bedtime. ), Disp: 30 tablet, Rfl: 1 .  Tapentadol HCl (NUCYNTA) 100 MG TABS, Take 50 mg by mouth 3 (three) times daily. 50 mg TID PRN for chronic back pain, fibromyalgia, Disp: , Rfl:  .  topiramate (TOPAMAX) 100 MG tablet, Take 1 tablet (100 mg total) by mouth 2 (two) times daily., Disp: 60 tablet, Rfl: 5 .  traZODone (DESYREL) 100 MG tablet, Take 150 mg by mouth at bedtime as needed for sleep (depends on insomnia). , Disp: , Rfl:  .  vitamin B-12 (CYANOCOBALAMIN) 1000 MCG tablet, Take 2,500 mcg by mouth daily., Disp: , Rfl:  .  Vitamin D, Ergocalciferol, (DRISDOL) 50000 UNITS CAPS, Take 50,000 Units by mouth every 7 (seven) days. Mondays, Disp: , Rfl:  .  YUVAFEM 10 MCG TABS vaginal tablet, , Disp: , Rfl:  Medication Side Effects: none  Family Medical/ Social History: Changes? No  MENTAL HEALTH EXAM:  There were no vitals taken for this visit.There is no height or weight on file to calculate BMI.  General Appearance: Unable to assess  Eye Contact:  Unable to assess  Speech:  Clear and Coherent  Volume:  Normal  Mood:  Euthymic  Affect:  Unable to assess  Thought  Process:  Goal Directed  Orientation:  Full (Time, Place, and Person)  Thought Content: Logical   Suicidal Thoughts:  No  Homicidal Thoughts:  No  Memory:  Immediate;   Good Recent;   Fair Remote;   Good  Judgement:  Good  Insight:  Good  Psychomotor Activity:  Unable to assess  Concentration:  Concentration: Good and Attention Span: Good  Recall:  Good  Fund of Knowledge: Good  Language: Good  Assets:  Desire for Improvement  ADL's:  Intact  Cognition: WNL  Prognosis:  Good    DIAGNOSES: No diagnosis found.  Receiving Psychotherapy: No    RECOMMENDATIONS:  Continue Concerta 27 mg 1 twice daily.  She knows not to take the second dose after around 2 PM or else it may keep her up. Continue Aricept 10 mg nightly. Continue Cymbalta 60 mg 2 daily. Continue gabapentin 800 mg 3 times  daily. Continue Cerefolin daily. Continue Topamax 100 mg twice daily. Continue trazodone 150 mg nightly as needed. Continue B complex, vitamin D and multivitamin daily. Return in 3 months.  Donnal Moat, PA-C   This record has been created using [employer].  Chart creation errors have been sought, but may not always have been located and corrected. Such creation errors do not reflect on the standard of medical care.

## 2018-06-01 IMAGING — CT CT ABD-PELV W/ CM
2 of 5 series · 16 of 46 positions shown, 18 images · IV contrast (Isovue)
Comparison: 12/12/2005

CLINICAL DATA: Elevated LFTs, history of stroke, GERD, former
smoker, fibromyalgia, gastric bypass surgery

EXAM:
CT ABDOMEN AND PELVIS WITH CONTRAST
TECHNIQUE: Multidetector CT imaging of the abdomen and pelvis was performed
using the standard protocol following bolus administration of
intravenous contrast. Sagittal and coronal MPR images reconstructed
from axial data set.
CONTRAST:  100mL LZU8D5-CBB IOPAMIDOL (LZU8D5-CBB) INJECTION 61% IV.
No oral contrast administered.

[Series 2: axial st · axial · 0.86mm/px · z∈[+1496,+1961]mm · 13 of 105 slices shown, 15 images]
[im 6/105  soft-tissue]
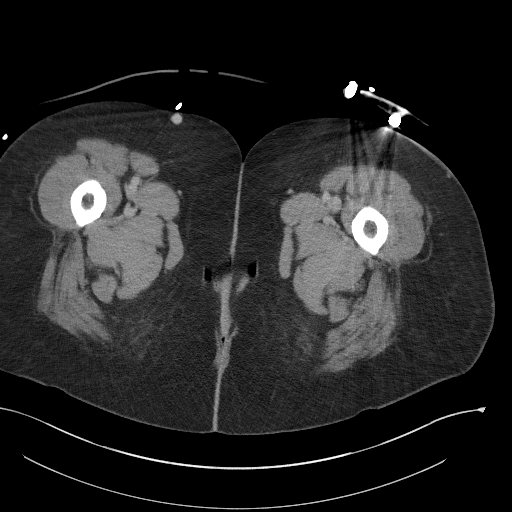
[im 6/105  bone]
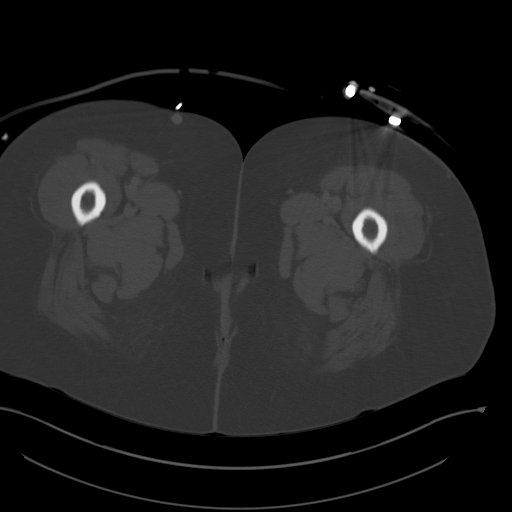
[im 12/105  soft-tissue]
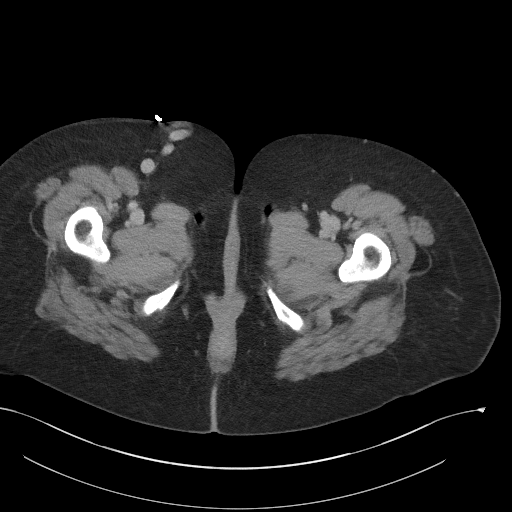
[im 24/105  soft-tissue]
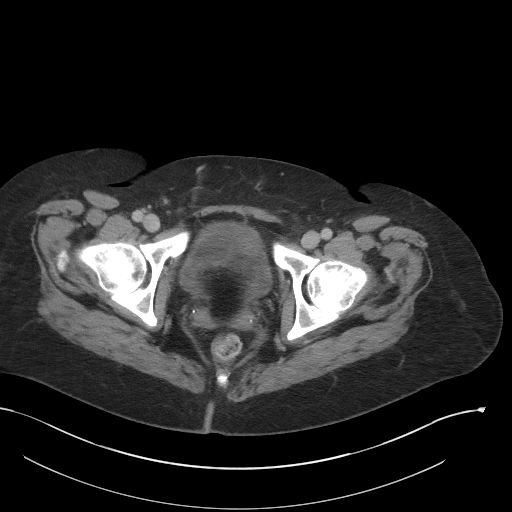
[im 29/105  soft-tissue]
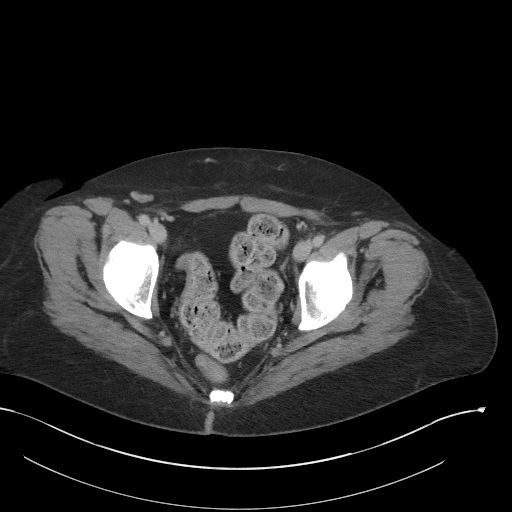
[im 35/105  soft-tissue]
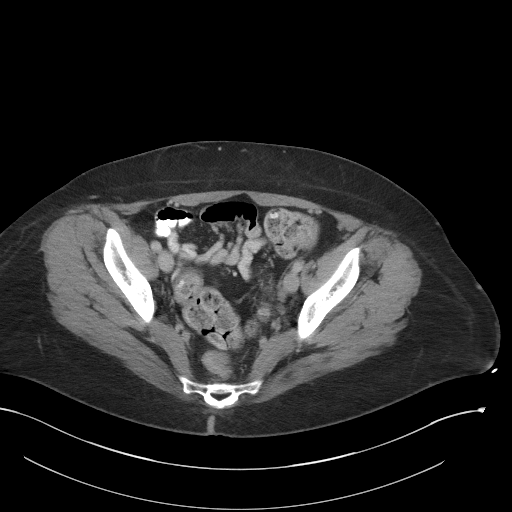
[im 47/105  soft-tissue]
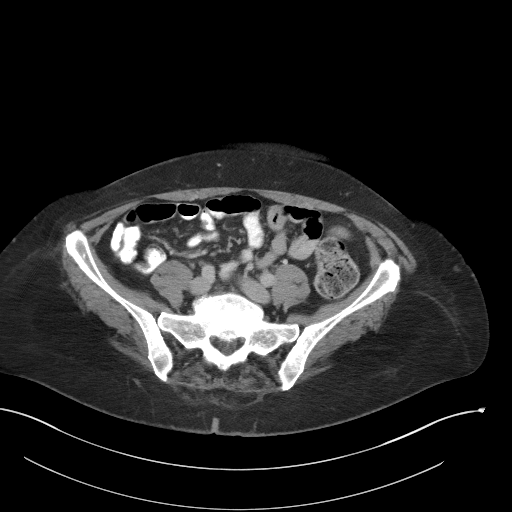
[im 53/105  soft-tissue]
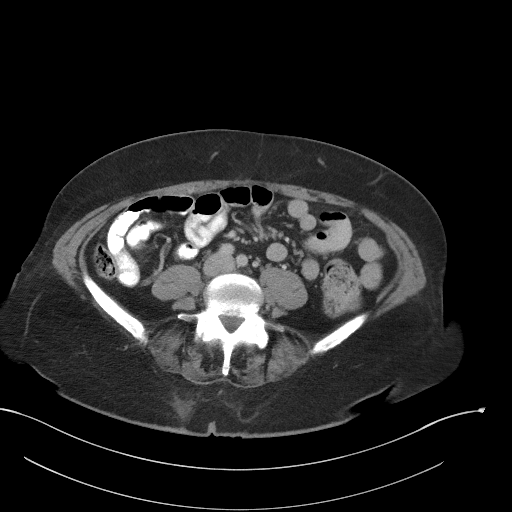
[im 58/105  soft-tissue]
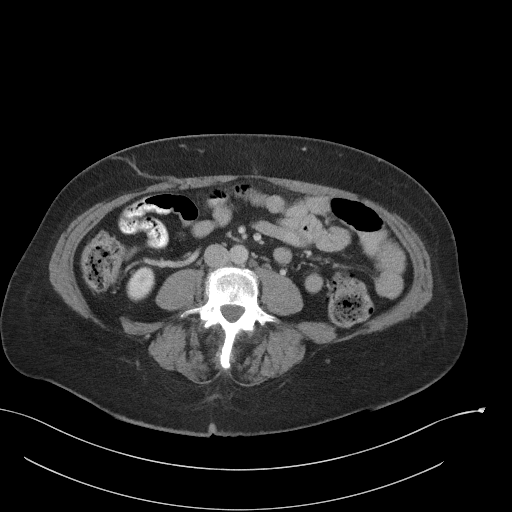
[im 70/105  soft-tissue]
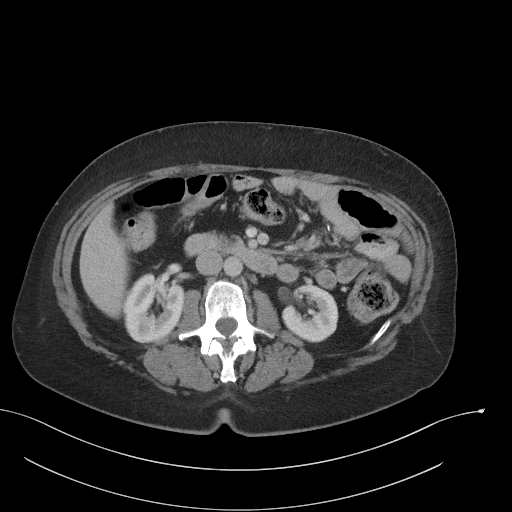
[im 70/105  bone]
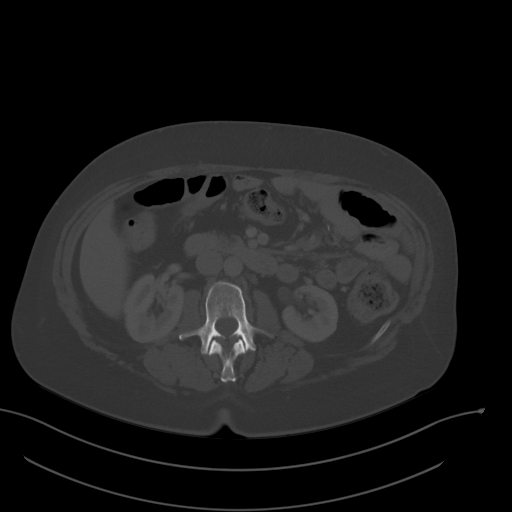
[im 76/105  soft-tissue]
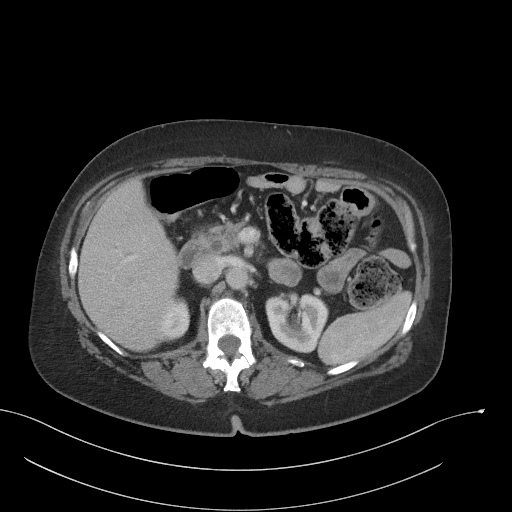
[im 81/105  soft-tissue]
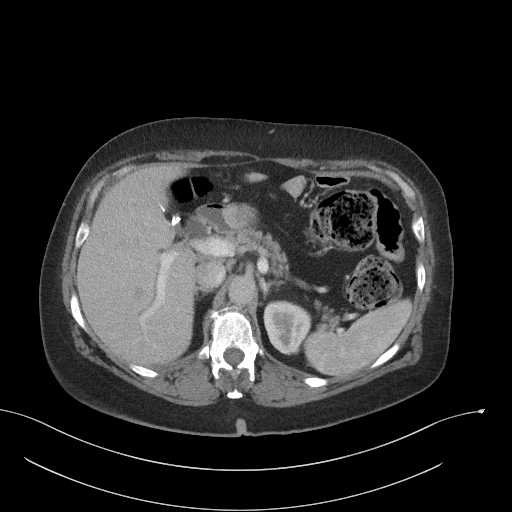
[im 93/105  soft-tissue]
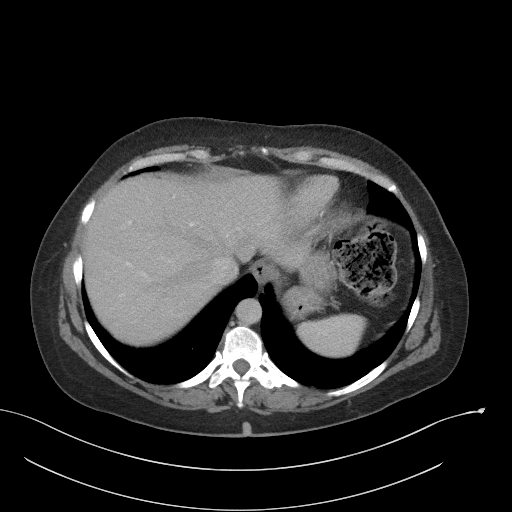
[im 99/105  soft-tissue]
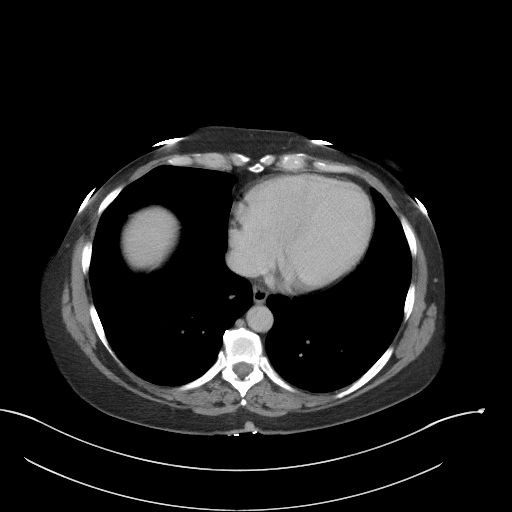

[Series 6: coronal st · coronal · 0.80mm/px · 3 of 99 slices shown]
[im 33/99  soft-tissue]
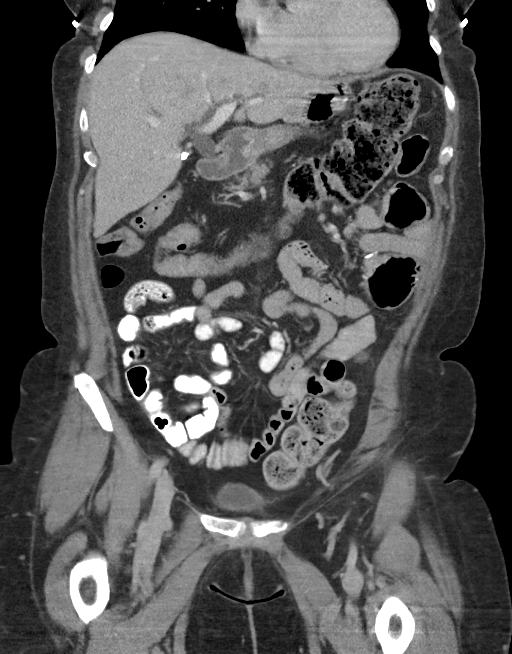
[im 44/99  soft-tissue]
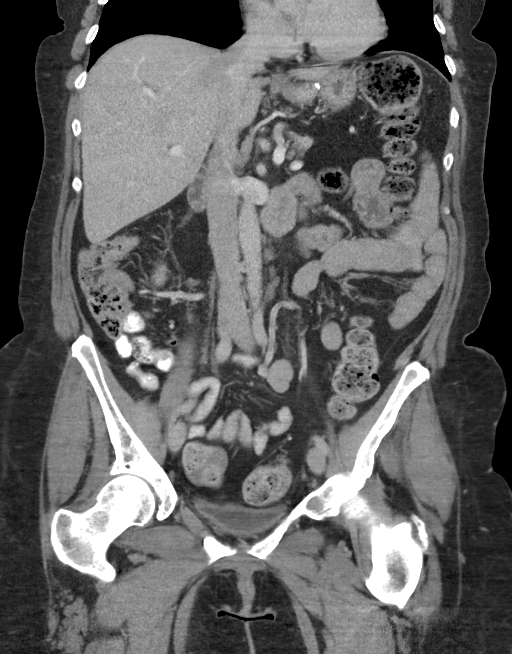
[im 55/99  soft-tissue]
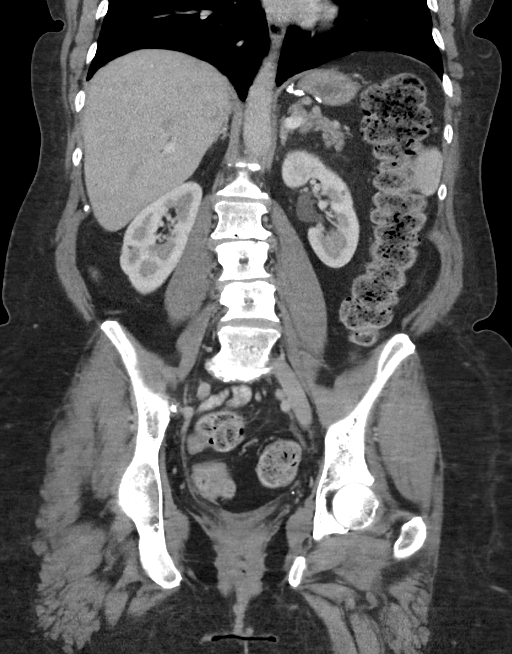

[16 of 46 positions shown; findings below may reference images not displayed]

FINDINGS: Lower chest: Lung bases clear

Hepatobiliary: Gallbladder surgically absent. Minimal intrahepatic
and extrahepatic biliary dilatation, CBD measuring 10 mm diameter
proximally and 8 mm distally. No definite obstructing distal
calculus or mass. Liver otherwise normal appearance.

Pancreas: Unremarkable

Spleen: Normal appearance

Adrenals/Urinary Tract: Adrenal glands, kidneys, ureters, and
bladder normal appearance

Stomach/Bowel: Prior gastric bypass surgery. Remainder of stomach
unremarkable. Prominent stool throughout colon. Large and small
bowel loops otherwise normal appearance.

Vascular/Lymphatic: Aorta normal caliber.  No adenopathy.

Reproductive: Uterus surgically absent. Question atrophic LEFT ovary
with nonvisualization of RIGHT ovary.

Other: No free air or free fluid. No hernia or acute inflammatory
process.

Musculoskeletal: Mild degenerative disc and facet disease changes
lumbar spine.
IMPRESSION: Mildly prominent stool in colon.

Prior gastric bypass surgery.

Mild intrahepatic and extrahepatic biliary dilatation, slightly
prominent for age though patient is post cholecystectomy, recommend
correlation with LFTs.

If there is clinical concern for distal CBD obstruction may consider
follow-up MRCP imaging.

## 2018-06-03 NOTE — Progress Notes (Signed)
Office Visit Note  Patient: Debbie Pineda             Date of Birth: 1960/08/20           MRN: [id number]             PCP: Jake Samples, PA-C Referring: Jake Samples, [place]* Visit Date: 06/10/2018 Occupation: @GUAROCC @  Subjective:  Neck pain.   History of Present Illness: Debbie Pineda is a 58 y.o. female she has osteoarthritis.  She states she has been having a lot of discomfort in the thoracic and lumbar region.  She has been seeing Dr. Louanne Skye and he is discussing possible surgery in the near future.  She also continues to have a lot of neck discomfort.  She has pain in bilateral trochanteric area.  She has generalized discomfort in multiple joints from osteoarthritis.  She continues to have discomfort in her knee joints.  She states her right [facility] joint which has been replaced and still painful.  Activities of Daily Living:  Patient reports morning stiffness for 1 hour.   Patient Reports nocturnal pain.  Difficulty dressing/grooming: Reports Difficulty climbing stairs: Reports Difficulty getting out of chair: Reports Difficulty using hands for taps, buttons, cutlery, and/or writing: Reports  Review of Systems  Constitutional: Positive for fatigue. Negative for night sweats, weight gain and weight loss.  HENT: Positive for mouth dryness. Negative for mouth sores, trouble swallowing, trouble swallowing and nose dryness.   Eyes: Positive for dryness. Negative for pain, redness and visual disturbance.  Respiratory: Negative for cough, shortness of breath and difficulty breathing.   Cardiovascular: Negative for chest pain, palpitations, hypertension, irregular heartbeat and swelling in legs/feet.  Gastrointestinal: Negative for blood in stool, constipation and diarrhea.  Endocrine: Negative for increased urination.  Genitourinary: Negative for vaginal dryness.  Musculoskeletal: Positive for arthralgias, joint pain, myalgias, morning stiffness and myalgias. Negative  for joint swelling, muscle weakness and muscle tenderness.  Skin: Negative for color change, rash, hair loss, skin tightness, ulcers and sensitivity to sunlight.  Allergic/Immunologic: Negative for susceptible to infections.  Neurological: Negative for dizziness, memory loss, night sweats and weakness.  Hematological: Negative for swollen glands.  Psychiatric/Behavioral: Positive for sleep disturbance. Negative for depressed mood. The patient is not nervous/anxious.     PMFS History:  Patient Active Problem List   Diagnosis Date Noted  . Attention deficit hyperactivity disorder (ADHD) 01/12/2018  . Insomnia 01/12/2018  . Elevated liver enzymes 09/10/2017  . History of diabetes mellitus 06/06/2017  . Primary osteoarthritis of right hand 06/06/2017  . Transaminasemia   . TIA (transient ischemic attack) 05/01/2017  . Dysphasia 05/01/2017  . Chronic pain syndrome 05/01/2017  . Confusion   . Presence of right artificial knee joint 09/17/2016  . H/O total knee replacement, right 08/15/2016  . Presence of retained hardware   . Memory disorder 08/14/2016  . Cerebrovascular disease 08/14/2016  . Unilateral primary osteoarthritis, right knee 05/29/2016  . Primary osteoarthritis of both feet 05/29/2016  . Trochanteric bursitis of both hips 05/25/2016  . Neck pain 05/25/2016  . Urinary urgency 04/19/2016  . Chronic low back pain 01/11/2016  . Depression 11/29/2015  . 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 Heart palpitations 12/14/2013  . Generalized osteoarthritis of multiple sites 11/04/2013  . Cerebral infarction ([place]) 10/30/2011  . PFO (patent foramen ovale) 10/30/2011  . Bradycardia 10/30/2011  . Chest pain 10/30/2011  . Hypothyroid   . Thrombophlebitis   . Sleep apnea   . Fibromyalgia   .  Status post bariatric surgery 12/07/2010  . Vein disorder 11/29/2010  . S/P total hysterectomy and bilateral salpingo-oophorectomy 11/29/2010  . S/P exploratory laparotomy 11/29/2010  .  S/P cholecystectomy 11/29/2010  . S/P ACL surgery 11/29/2010  . Morbid obesity ([place]) 11/10/2010    Past Medical History:  Diagnosis Date  . ADD (attention deficit disorder)    takes Adderall daily  . Arthritis    "all over"  . Cerebral infarction ([place]) 10/30/2011  . Cerebrovascular disease 08/14/2016  . Chronic back pain    "all over"  . Chronic low back pain 01/11/2016  . Complication of anesthesia    tends to have hypotension when NPO and post-anesthesia  . Constipation    takes stool softener daily  . Degenerative disk disease   . Degenerative joint disease   . Depression    takes Cymbalta daily for pain per pt  . Diabetes mellitus without complication ([place])   . DVT (deep venous thrombosis) ([facility])    RLE  . Family history of adverse reaction to anesthesia    a family member woke up during surgery; "think it was my mom"  . Fibromyalgia   . Generalized osteoarthritis of multiple sites 11/04/2013  . GERD (gastroesophageal reflux disease)   . Heart palpitations 12/14/2013  . History of blood clots    superficial  . Hypoglycemia   . Hypothyroid    takes Synthroid daily  . Incomplete emptying of bladder   . Insomnia    takes Trazodone nightly  . Iron deficiency anemia    takes Ferrous Sulfate daily  . Joint pain   . Joint swelling    knees and ankles  . Memory disorder 08/14/2016  . Morbid obesity ([place])   . Nausea    takes Zofran as needed.Seeing GI doc  . Neck pain 05/25/2016  . OSA on CPAP    tested more than 5 yrs ago.    . Osteoarthritis   . PFO (patent foramen ovale)   . Primary osteoarthritis of both feet 05/29/2016   Right bunionectomy August 2017 by Dr. Sharol Given  . Scoliosis   . Sleep apnea   . Stroke [facility]) "several"   right foot weakness; memory issues, black spot right visual field since" (03/23/2015)  . Thrombophlebitis   . Trochanteric bursitis of both hips 05/25/2016  . Unilateral primary osteoarthritis, right knee 05/29/2016  . Urinary urgency 04/19/2016  .  Vein disorder 11/29/2010    Family History  Problem Relation Age of Onset  . Heart disease Father   . Cancer Father   . Parkinson's disease Father   . Cancer Mother        skin cancer   . Myasthenia gravis Mother   . Heart disease Brother   . Cancer Brother   . Diabetes Brother   . Stroke Brother   . Heart disease Sister   . Heart attack Sister   . Cancer Maternal Grandfather   . Hypothyroidism Daughter   . Hypertension Other   . Colon cancer Neg Hx    Past Surgical History:  Procedure Laterality Date  . BONE EXCISION Right 08/29/2017   Procedure: right trapezium excision;  Surgeon: Daryll Brod, MD;  Location: [facility];  Service: Orthopedics;  Laterality: Right;  . BUNIONECTOMY Right 08/2015  . CARDIAC CATHETERIZATION     2008.  "it was fine" (not sure why she had it done, and doesn't know where)  . CARPOMETACARPEL SUSPENSION PLASTY Right 08/29/2017   Procedure: SUSPENSION PLASTY RIGHT THUMB;  Surgeon: Daryll Brod, MD;  Location: [place];  Service: Orthopedics;  Laterality: Right;  . COLONOSCOPY N/A 03/25/2013   Procedure: COLONOSCOPY;  Surgeon: Rogene Houston, MD;  [facility];  Service: Endoscopy;  Laterality: N/A;  930  . ESOPHAGOGASTRODUODENOSCOPY    . EXPLORATORY LAPAROTOMY     "took fallopian tubes out"  . JOINT REPLACEMENT     bil knee   . KNEE ARTHROPLASTY    . KNEE ARTHROSCOPY Left   . KNEE ARTHROSCOPY W/ ACL RECONSTRUCTION Right    "added pins"  . LAPAROSCOPIC CHOLECYSTECTOMY  ~ 2001  . 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 SPINAL CORD STIMULATOR INSERTION N/A 04/18/2017   Procedure: LUMBAR SPINAL CORD STIMULATOR INSERTION;  Surgeon: Clydell Hakim, MD;  Location: [place];  Service: Neurosurgery;  Laterality: N/A;  LUMBAR SPINAL CORD STIMULATOR INSERTION  . TENDON TRANSFER Right 08/29/2017   Procedure: right abductor pollicis longus transfer;  Surgeon: Daryll Brod, MD;  Location: [place];  Service:  Orthopedics;  Laterality: Right;  . TOTAL KNEE ARTHROPLASTY Left 03/23/2015   Procedure: TOTAL KNEE ARTHROPLASTY;  Surgeon: Newt Minion, MD;  Location: [place];  Service: Orthopedics;  Laterality: Left;  . TOTAL KNEE ARTHROPLASTY Right 08/15/2016   Procedure: RIGHT TOTAL KNEE ARTHROPLASTY, REMOVAL ACL SCREWS;  Surgeon: Newt Minion, MD;  Location: [place];  Service: Orthopedics;  Laterality: Right;  . TOTAL KNEE ARTHROPLASTY WITH HARDWARE REMOVAL Right   . VAGINAL HYSTERECTOMY    . VARICOSE VEIN SURGERY Right X 2   Social History   Social History Narrative   Lives with husband   Caffeine use: No soda   Mainly water, drinks decaf tea   Right handed   Immunization History  Administered Date(s) Administered  . Influenza Split 11/01/2011  . Influenza-Unspecified 08/23/2014     Objective: Vital Signs: BP 101/64 (BP Location: Left Arm, Patient Position: Sitting, Cuff Size: Large)   Pulse 69   Resp 14   Ht 5\' 5"  (1.651 m)   Wt 219 lb 12.8 oz (99.7 kg)   BMI 36.58 kg/m    Physical Exam Vitals signs and nursing note reviewed.  Constitutional:      Appearance: She is well-developed.  HENT:     Head: Normocephalic and atraumatic.  Eyes:     Conjunctiva/sclera: Conjunctivae normal.  Neck:     Musculoskeletal: Normal range of motion.  Cardiovascular:     Rate and Rhythm: Normal rate and regular rhythm.     Heart sounds: Normal heart sounds.  Pulmonary:     Effort: Pulmonary effort is normal.     Breath sounds: Normal breath sounds.  Abdominal:     General: Bowel sounds are normal.     Palpations: Abdomen is soft.  Lymphadenopathy:     Cervical: No cervical adenopathy.  Skin:    General: Skin is warm and dry.     Capillary Refill: Capillary refill takes less than 2 seconds.  Neurological:     Mental Status: She is alert and oriented to person, place, and time.  Psychiatric:        Behavior: Behavior normal.      Musculoskeletal Exam: Patient is stiffness with range of  motion of her C-spine.  She had bilateral trapezius spasm.  She had discomfort in thoracic and lumbar region.  She had tenderness on palpation of bilateral trochanteric bursa.  She had good range of motion of her shoulders elbows wrists MCPs and PIPs.  She  has tenderness over her right CMC joint.  She had good range of motion of her hip joints and knee joints.  No warmth swelling or effusion was noted.  Both knee joints have been replaced.  CDAI Exam: CDAI Score: Not documented Patient Global Assessment: Not documented; Provider Global Assessment: Not documented Swollen: Not documented; Tender: Not documented Joint Exam   Not documented   There is currently no information documented on the homunculus. Go to the Rheumatology activity and complete the homunculus joint exam.  Investigation: No additional findings.  Imaging: No results found.  Recent Labs: Lab Results  Component Value Date   WBC 5.0 07/26/2017   HGB 11.9 (L) 07/26/2017   PLT 186 07/26/2017   NA 141 07/26/2017   K 4.0 07/26/2017   CL 110 07/26/2017   CO2 25 07/26/2017   GLUCOSE 107 (H) 07/26/2017   BUN 17 07/26/2017   CREATININE 0.87 07/26/2017   BILITOT 0.3 09/10/2017   ALKPHOS 127 (H) 07/26/2017   AST 24 09/10/2017   ALT 33 (H) 09/10/2017   PROT 6.3 09/10/2017   ALBUMIN 3.5 07/26/2017   CALCIUM 8.8 (L) 07/26/2017   GFRAA >60 07/26/2017    Speciality Comments: No specialty comments available.  Procedures:  Trigger Point Inj Date/Time: 06/10/2018 12:15 PM Performed by: Bo Merino, MD Authorized by: Bo Merino, MD   Consent Given by:  Patient Site marked: the procedure site was marked   Timeout: prior to procedure the correct patient, procedure, and site was verified   Indications:  Muscle spasm and pain Total # of Trigger Points:  2 Location: neck   Needle Size:  27 G Approach:  Dorsal Medications #1:  0.5 mL lidocaine 1 %; 10 mg triamcinolone acetonide 40 MG/ML Medications #2:  0.5  mL lidocaine 1 %; 10 mg triamcinolone acetonide 40 MG/ML Patient tolerance:  Patient tolerated the procedure well with no immediate complications   Allergies: Lyrica [pregabalin]; Flexeril [cyclobenzaprine hcl]; Belsomra [suvorexant]; Morphine and related; Sulfamethoxazole-trimethoprim; and Tape   Assessment / Plan:     Visit Diagnoses: Fibromyalgia-patient continues to have pain and discomfort from fibromyalgia.  She states she has some generalized pain.  Primary osteoarthritis of right hand - CMC surgery by Dr. Fredna Dow.  She still continues to have discomfort in the right [facility] joint.    Trochanteric bursitis of both hips-IT band stretches were discussed.  History of total bilateral knee replacement-chronic pain.  Primary osteoarthritis of both feet - Right bunionectomy August 2017 by Dr. Sharol Given   Neck pain-she had lot of pain in the trapezius area and muscle spasm.  Per her request bilateral trapezius area were injected with cortisone as described above.  She tolerated the procedure well.  DDD (degenerative disc disease), thoracic-she has chronic discomfort in the thoracic region.  She is planning to have fusion in the near future with Dr. Louanne Skye.  DDD (degenerative disc disease), lumbar - She's been followed by Dr. Louanne Skye.  According to patient she will be getting lumbar spine fusion most likely.  Other medical problems are listed as follows:  History of thrombophlebitis  History of diabetes mellitus  History of cerebral infarction  History of gastric bypass - 11/20/2010   History of sleep apnea  PFO (patent foramen ovale)   Orders: Orders Placed This Encounter  Procedures  . Trigger Point Inj   No orders of the defined types were placed in this encounter.   Face-to-face time spent with patient was 30 minutes. Greater than 50% of time was spent  in counseling and coordination of care.  Follow-Up Instructions: Return in about 6 months (around 12/11/2018) for Fibromyalgia,  osteoarthritis.   Bo Merino, MD  Note - This record has been created using [employer].  Chart creation errors have been sought, but may not always  have been located. Such creation errors do not reflect on  the standard of medical care.

## 2018-06-09 ENCOUNTER — Other Ambulatory Visit (HOSPITAL_COMMUNITY): Payer: Self-pay | Admitting: Family Medicine

## 2018-06-09 DIAGNOSIS — E2839 Other primary ovarian failure: Secondary | ICD-10-CM

## 2018-06-10 ENCOUNTER — Encounter: Payer: Self-pay | Admitting: Rheumatology

## 2018-06-10 ENCOUNTER — Other Ambulatory Visit: Payer: Self-pay

## 2018-06-10 ENCOUNTER — Ambulatory Visit: Payer: PPO | Admitting: Rheumatology

## 2018-06-10 VITALS — BP 101/64 | HR 69 | Resp 14 | Ht 65.0 in | Wt 219.8 lb

## 2018-06-10 DIAGNOSIS — Z96653 Presence of artificial knee joint, bilateral: Secondary | ICD-10-CM | POA: Diagnosis not present

## 2018-06-10 DIAGNOSIS — Z8672 Personal history of thrombophlebitis: Secondary | ICD-10-CM

## 2018-06-10 DIAGNOSIS — M542 Cervicalgia: Secondary | ICD-10-CM

## 2018-06-10 DIAGNOSIS — Z8669 Personal history of other diseases of the nervous system and sense organs: Secondary | ICD-10-CM

## 2018-06-10 DIAGNOSIS — Q2112 Patent foramen ovale: Secondary | ICD-10-CM

## 2018-06-10 DIAGNOSIS — M797 Fibromyalgia: Secondary | ICD-10-CM | POA: Diagnosis not present

## 2018-06-10 DIAGNOSIS — M19041 Primary osteoarthritis, right hand: Secondary | ICD-10-CM

## 2018-06-10 DIAGNOSIS — Z8673 Personal history of transient ischemic attack (TIA), and cerebral infarction without residual deficits: Secondary | ICD-10-CM

## 2018-06-10 DIAGNOSIS — Z8639 Personal history of other endocrine, nutritional and metabolic disease: Secondary | ICD-10-CM | POA: Diagnosis not present

## 2018-06-10 DIAGNOSIS — M5136 Other intervertebral disc degeneration, lumbar region: Secondary | ICD-10-CM | POA: Diagnosis not present

## 2018-06-10 DIAGNOSIS — M7061 Trochanteric bursitis, right hip: Secondary | ICD-10-CM

## 2018-06-10 DIAGNOSIS — M19072 Primary osteoarthritis, left ankle and foot: Secondary | ICD-10-CM

## 2018-06-10 DIAGNOSIS — M5134 Other intervertebral disc degeneration, thoracic region: Secondary | ICD-10-CM

## 2018-06-10 DIAGNOSIS — Z9884 Bariatric surgery status: Secondary | ICD-10-CM | POA: Diagnosis not present

## 2018-06-10 DIAGNOSIS — M19071 Primary osteoarthritis, right ankle and foot: Secondary | ICD-10-CM | POA: Diagnosis not present

## 2018-06-10 DIAGNOSIS — M7062 Trochanteric bursitis, left hip: Secondary | ICD-10-CM

## 2018-06-10 DIAGNOSIS — Q211 Atrial septal defect: Secondary | ICD-10-CM

## 2018-06-10 MED ORDER — LIDOCAINE HCL 1 % IJ SOLN
0.5000 mL | INTRAMUSCULAR | Status: AC | PRN
  Administered 2018-06-10: .5 mL

## 2018-06-10 MED ORDER — TRIAMCINOLONE ACETONIDE 40 MG/ML IJ SUSP
10.0000 mg | INTRAMUSCULAR | Status: AC | PRN
  Administered 2018-06-10: 10 mg via INTRAMUSCULAR

## 2018-06-12 ENCOUNTER — Telehealth: Payer: Self-pay

## 2018-06-12 MED ORDER — TOPIRAMATE 100 MG PO TABS: 100.0000 mg | ORAL_TABLET | Freq: Two times a day (BID) | ORAL | 5 refills | Status: DC

## 2018-06-12 NOTE — Telephone Encounter (Signed)
New script has been sent to [facility].

## 2018-06-12 NOTE — Addendum Note (Signed)
Addended by: Thomes Cake on: 06/12/2018 02:16 PM   Modules accepted: Orders

## 2018-06-12 NOTE — Telephone Encounter (Signed)
Received paperwork from [facility] where the patient is requesting that her Topiramate 50 mg 3 tablets at bedtime to be changed to Topiramate 100 mg tablets twice daily. 1 tablet at 10 AM & 10 PM. Please advise

## 2018-06-12 NOTE — Telephone Encounter (Signed)
Per last visit note she reported headache increase, at her request we increased Topamax to 100 mg twice daily. I sent the rx on 05/12/2018 to [facility] in [place]. If pill pack is asking for new rx, for topamax 100 mg twice daily that should be fine.

## 2018-06-19 ENCOUNTER — Ambulatory Visit (HOSPITAL_COMMUNITY): Payer: PPO

## 2018-06-19 ENCOUNTER — Telehealth: Payer: Self-pay | Admitting: Cardiovascular Disease

## 2018-06-19 NOTE — Telephone Encounter (Signed)
Virtual Visit Pre-Appointment Phone Call  "(Name), I am calling you today to discuss your upcoming appointment. We are currently trying to limit exposure to the virus that causes COVID-19 by seeing patients at home rather than in the office."  1. "What is the BEST phone number to call the day of the visit?" - include this in appointment notes  2. Do you have or have access to (through a family member/friend) a smartphone with video capability that we can use for your visit?" a. If yes - list this number in appt notes as cell (if different from BEST phone #) and list the appointment type as a VIDEO visit in appointment notes b. If no - list the appointment type as a PHONE visit in appointment notes  3. Confirm consent - "In the setting of the current Covid19 crisis, you are scheduled for a (phone or video) visit with your provider on (date) at (time).  Just as we do with many in-office visits, in order for you to participate in this visit, we must obtain consent.  If you'd like, I can send this to your mychart (if signed up) or email for you to review.  Otherwise, I can obtain your verbal consent now.  All virtual visits are billed to your insurance company just like a normal visit would be.  By agreeing to a virtual visit, we'd like you to understand that the technology does not allow for your provider to perform an examination, and thus may limit your provider's ability to fully assess your condition. If your provider identifies any concerns that need to be evaluated in person, we will make arrangements to do so.  Finally, though the technology is pretty good, we cannot assure that it will always work on either your or our end, and in the setting of a video visit, we may have to convert it to a phone-only visit.  In either situation, we cannot ensure that we have a secure connection.  Are you willing to proceed?" STAFF: Did the patient verbally acknowledge consent to telehealth visit? Document  YES/NO here: yes  4. Advise patient to be prepared - "Two hours prior to your appointment, go ahead and check your blood pressure, pulse, oxygen saturation, and your weight (if you have the equipment to check those) and write them all down. When your visit starts, your provider will ask you for this information. If you have an Apple Watch or Kardia device, please plan to have heart rate information ready on the day of your appointment. Please have a pen and paper handy nearby the day of the visit as well."  5. Give patient instructions for MyChart download to smartphone OR [email or web address] as below if video visit (depending on what platform provider is using)  6. Inform patient they will receive a phone call 15 minutes prior to their appointment time (may be from unknown caller ID) so they should be prepared to answer    TELEPHONE CALL NOTE  CHENAY NESMITH has been deemed a candidate for a follow-up tele-health visit to limit community exposure during the Covid-19 pandemic. I spoke with the patient via phone to ensure availability of phone/video source, confirm preferred email & phone number, and discuss instructions and expectations.  I reminded AYVAH CAROLL to be prepared with any vital sign and/or heart rhythm information that could potentially be obtained via home monitoring, at the time of her visit. I reminded SACOYA MCGOURTY to expect a phone call prior to  her visit.  Orinda Kenner 06/19/2018 2:33 PM

## 2018-06-20 ENCOUNTER — Ambulatory Visit (HOSPITAL_COMMUNITY)
Admission: RE | Admit: 2018-06-20 | Discharge: 2018-06-20 | Disposition: A | Discharge status: Home / Self Care | Payer: PPO | Source: Ambulatory Visit | Attending: Family Medicine | Admitting: Family Medicine

## 2018-06-20 ENCOUNTER — Other Ambulatory Visit: Payer: Self-pay

## 2018-06-20 DIAGNOSIS — Z780 Asymptomatic menopausal state: Secondary | ICD-10-CM | POA: Diagnosis not present

## 2018-06-20 DIAGNOSIS — E2839 Other primary ovarian failure: Secondary | ICD-10-CM | POA: Insufficient documentation

## 2018-06-24 ENCOUNTER — Telehealth (INDEPENDENT_AMBULATORY_CARE_PROVIDER_SITE_OTHER): Payer: PPO | Admitting: Cardiovascular Disease

## 2018-06-24 ENCOUNTER — Encounter: Payer: Self-pay | Admitting: Cardiovascular Disease

## 2018-06-24 ENCOUNTER — Other Ambulatory Visit: Payer: Self-pay

## 2018-06-24 VITALS — BP 106/65 | HR 73 | Ht 65.0 in | Wt 216.0 lb

## 2018-06-24 DIAGNOSIS — Q211 Atrial septal defect: Secondary | ICD-10-CM

## 2018-06-24 DIAGNOSIS — I639 Cerebral infarction, unspecified: Secondary | ICD-10-CM

## 2018-06-24 DIAGNOSIS — M79661 Pain in right lower leg: Secondary | ICD-10-CM

## 2018-06-24 DIAGNOSIS — I83893 Varicose veins of bilateral lower extremities with other complications: Secondary | ICD-10-CM

## 2018-06-24 DIAGNOSIS — M7989 Other specified soft tissue disorders: Secondary | ICD-10-CM

## 2018-06-24 DIAGNOSIS — Q2112 Patent foramen ovale: Secondary | ICD-10-CM

## 2018-06-24 NOTE — Patient Instructions (Signed)
Medication Instructions: Your physician recommends that you continue on your current medications as directed. Please refer to the Current Medication list given to you today.   Labwork: none  Procedures/Testing: none  Follow-Up: 1 year with Dr.Koneswaran  Any Additional Special Instructions Will Be Listed Below (If Applicable).     If you need a refill on your cardiac medications before your next appointment, please call your pharmacy.       Thank you for choosing [facility] !        

## 2018-06-24 NOTE — Progress Notes (Signed)
Virtual Visit via Video Note   This visit type was conducted due to national recommendations for restrictions regarding the COVID-19 Pandemic (e.g. social distancing) in an effort to limit this patient's exposure and mitigate transmission in our community.  Due to her co-morbid illnesses, this patient is at least at moderate risk for complications without adequate follow up.  This format is felt to be most appropriate for this patient at this time.  All issues noted in this document were discussed and addressed.  A limited physical exam was performed with this format.  Please refer to the patient's chart for her consent to telehealth for [facility].   Date:  06/24/2018   ID:  Debbie Pineda, DOB 1961-01-10, MRN [id number]  Patient Location: Home Provider Location: Office  PCP:  Jake Samples, PA-C  Cardiologist:  Kate Sable, MD  Electrophysiologist:  None   Evaluation Performed:  Follow-Up Visit  Chief Complaint:  PFO  History of Present Illness:    Debbie Pineda is a 58 y.o. female with a complex past medical history which includes history of recurrent stroke with residual right hemianopsia in 2013, PFO, DVT, factor V Leiden deficiency (although negative on 08/14/2016), chronic pain syndrome, and GERD.  She has a history of PFO and is on Xarelto.  This was seen by TEE at [facility] on 12/06/2011.  It was not seen by most recent transthoracic echocardiogram.  I reviewed cardiology office notes from [place] on 11/30/2016.  She was previously evaluated by hematology at [facility] who felt that lifelong anticoagulation and not PFO closure was the best management strategy.  I reviewed the echocardiogram performed on 05/02/2017 which demonstrated normal left ventricular systolic function, EF 60 to 65%.  She denies chest pain, palpitations, orthopnea, and shortness of breath.  Her primary complaints relate to bilateral leg and feet cramping, right greater than left.  She attributes  this to her varicose veins.  She is currently staying with her mother.  Her brother passed away last month of a heart attack and a sister passed away of a heart attack in 2018.  Her husband works full-time.  The patient does not have symptoms concerning for COVID-19 infection (fever, chills, cough, or new shortness of breath).    Past Medical History:  Diagnosis Date  . ADD (attention deficit disorder)    takes Adderall daily  . Arthritis    "all over"  . Cerebral infarction ([facility]) 10/30/2011  . Cerebrovascular disease 08/14/2016  . Chronic back pain    "all over"  . Chronic low back pain 01/11/2016  . Complication of anesthesia    tends to have hypotension when NPO and post-anesthesia  . Constipation    takes stool softener daily  . Degenerative disk disease   . Degenerative joint disease   . Depression    takes Cymbalta daily for pain per pt  . Diabetes mellitus without complication ([place])   . DVT (deep venous thrombosis) ([facility])    RLE  . Family history of adverse reaction to anesthesia    a family member woke up during surgery; "think it was my mom"  . Fibromyalgia   . Generalized osteoarthritis of multiple sites 11/04/2013  . GERD (gastroesophageal reflux disease)   . Heart palpitations 12/14/2013  . History of blood clots    superficial  . Hypoglycemia   . Hypothyroid    takes Synthroid daily  . Incomplete emptying of bladder   . Insomnia    takes Trazodone nightly  .  Iron deficiency anemia    takes Ferrous Sulfate daily  . Joint pain   . Joint swelling    knees and ankles  . Memory disorder 08/14/2016  . Morbid obesity ([place])   . Nausea    takes Zofran as needed.Seeing GI doc  . Neck pain 05/25/2016  . OSA on CPAP    tested more than 5 yrs ago.    . Osteoarthritis   . PFO (patent foramen ovale)   . Primary osteoarthritis of both feet 05/29/2016   Right bunionectomy August 2017 by Dr. Sharol Given  . Scoliosis   . Sleep apnea   . Stroke [facility]) "several"   right foot  weakness; memory issues, black spot right visual field since" (03/23/2015)  . Thrombophlebitis   . Trochanteric bursitis of both hips 05/25/2016  . Unilateral primary osteoarthritis, right knee 05/29/2016  . Urinary urgency 04/19/2016  . Vein disorder 11/29/2010   Past Surgical History:  Procedure Laterality Date  . BONE EXCISION Right 08/29/2017   Procedure: right trapezium excision;  Surgeon: Daryll Brod, MD;  Location: [place];  Service: Orthopedics;  Laterality: Right;  . BUNIONECTOMY Right 08/2015  . CARDIAC CATHETERIZATION     2008.  "it was fine" (not sure why she had it done, and doesn't know where)  . CARPOMETACARPEL SUSPENSION PLASTY Right 08/29/2017   Procedure: SUSPENSION PLASTY RIGHT THUMB;  Surgeon: Daryll Brod, MD;  Location: [place];  Service: Orthopedics;  Laterality: Right;  . COLONOSCOPY N/A 03/25/2013   Procedure: COLONOSCOPY;  Surgeon: Rogene Houston, MD;  [facility];  Service: Endoscopy;  Laterality: N/A;  930  . ESOPHAGOGASTRODUODENOSCOPY    . EXPLORATORY LAPAROTOMY     "took fallopian tubes out"  . JOINT REPLACEMENT     bil knee   . KNEE ARTHROPLASTY    . KNEE ARTHROSCOPY Left   . KNEE ARTHROSCOPY W/ ACL RECONSTRUCTION Right    "added pins"  . LAPAROSCOPIC CHOLECYSTECTOMY  ~ 2001  . 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 SPINAL CORD STIMULATOR INSERTION N/A 04/18/2017   Procedure: LUMBAR SPINAL CORD STIMULATOR INSERTION;  Surgeon: Clydell Hakim, MD;  Location: [place];  Service: Neurosurgery;  Laterality: N/A;  LUMBAR SPINAL CORD STIMULATOR INSERTION  . TENDON TRANSFER Right 08/29/2017   Procedure: right abductor pollicis longus transfer;  Surgeon: Daryll Brod, MD;  Location: [place];  Service: Orthopedics;  Laterality: Right;  . TOTAL KNEE ARTHROPLASTY Left 03/23/2015   Procedure: TOTAL KNEE ARTHROPLASTY;  Surgeon: Newt Minion, MD;  Location: [place];  Service: Orthopedics;  Laterality: Left;  . TOTAL KNEE  ARTHROPLASTY Right 08/15/2016   Procedure: RIGHT TOTAL KNEE ARTHROPLASTY, REMOVAL ACL SCREWS;  Surgeon: Newt Minion, MD;  Location: [place];  Service: Orthopedics;  Laterality: Right;  . TOTAL KNEE ARTHROPLASTY WITH HARDWARE REMOVAL Right   . VAGINAL HYSTERECTOMY    . VARICOSE VEIN SURGERY Right X 2     Current Meds  Medication Sig  . amoxicillin (AMOXIL) 500 MG capsule Take 500 mg by mouth as needed (Dental procedures).  . baclofen (LIORESAL) 10 MG tablet Take 10 mg by mouth 3 (three) times daily.   . Cyanocobalamin (VITAMIN B-12) 5000 MCG SUBL Place under the tongue daily.  . diclofenac sodium (VOLTAREN) 1 % GEL Apply 4 g topically 4 (four) times daily as needed (PAIN).  Marland Kitchen donepezil (ARICEPT) 10 MG tablet Take 1 tablet (10 mg total) by mouth at bedtime.  . DULoxetine (CYMBALTA) 60  MG capsule Take 60 mg by mouth 2 (two) times daily.  . fluconazole (DIFLUCAN) 150 MG tablet Take 150 mg by mouth as needed.  . folic acid (FOLVITE) 094 MCG tablet Take 400 mcg by mouth daily.  . Fremanezumab-vfrm (AJOVY) 225 MG/1.5ML SOSY Inject 225 mg into the skin every 30 (thirty) days.  Marland Kitchen gabapentin (NEURONTIN) 800 MG tablet Take 1 tablet (800 mg total) by mouth 3 (three) times daily.  Marland Kitchen guaifenesin (HUMIBID E) 400 MG TABS tablet Take 1,200 mg by mouth daily.  Marland Kitchen KRILL OIL PO Take 350 mg by mouth.   Marland Kitchen L-Methylfolate-B12-B6-B2 (CEREFOLIN) 06-22-48-5 MG TABS Take 1 tablet by mouth daily.  Marland Kitchen levothyroxine (SYNTHROID) 150 MCG tablet Take 150 mcg by mouth daily before breakfast.  . [START ON 06/28/2018] methylphenidate (CONCERTA) 27 MG PO CR tablet Take 1 tablet (27 mg total) by mouth 2 (two) times a day.  . mirabegron ER (MYRBETRIQ) 50 MG TB24 tablet Take 50 mg by mouth daily.  . Multiple Minerals-Vitamins (CAL-MAG-ZINC-D PO) Take 3 tablets by mouth daily. Take 2 tablet at 1200 and 1 tablet at 1600  Calcium 1000 mg Vit D 600 iu Magnesium 400 mg Zinc 15 mg  . Multiple Vitamins-Minerals (ONE-A-DAY WOMENS PETITES  PO) Take by mouth daily.  Marland Kitchen NARCAN 4 MG/0.1ML LIQD nasal spray kit Place 1 spray into the nose as needed (over dose).   Marland Kitchen omeprazole (PRILOSEC) 40 MG capsule Take 1 capsule by mouth twice daily.  . ondansetron (ZOFRAN) 4 MG tablet Take 4 mg by mouth every 8 (eight) hours as needed for nausea or vomiting.  . rivaroxaban (XARELTO) 20 MG TABS tablet Take 1 tablet (20 mg total) by mouth daily with supper. (Patient taking differently: Take 20 mg by mouth at bedtime. )  . Tapentadol HCl (NUCYNTA) 100 MG TABS Take 50 mg by mouth 3 (three) times daily. 50 mg TID PRN for chronic back pain, fibromyalgia  . topiramate (TOPAMAX) 100 MG tablet Take 1 tablet (100 mg total) by mouth 2 (two) times daily.  . traZODone (DESYREL) 100 MG tablet Take 200 mg by mouth at bedtime as needed for sleep (depends on insomnia).   . vitamin C (ASCORBIC ACID) 500 MG tablet Take 500 mg by mouth daily.  . Vitamin D, Ergocalciferol, (DRISDOL) 50000 UNITS CAPS Take 50,000 Units by mouth every 7 (seven) days. Mondays  . [DISCONTINUED] Fremanezumab-vfrm (AJOVY) 225 MG/1.5ML SOSY Inject into the skin. Every 30 days.     Allergies:   Lyrica [pregabalin]; Flexeril [cyclobenzaprine hcl]; Belsomra [suvorexant]; Morphine and related; Sulfamethoxazole-trimethoprim; and Tape   Social History   Tobacco Use  . Smoking status: Former Smoker    Packs/day: 0.75    Years: 8.00    Pack years: 6.00    Types: Cigarettes    Last attempt to quit: 12/01/1990    Years since quitting: 27.5  . Smokeless tobacco: Never Used  . Tobacco comment: quit smoking in the 1990s  Substance Use Topics  . Alcohol use: No    Comment: 03/23/2015 "stopped drinking in 2012 w/gastric bypass; drank socially before bypass"  . Drug use: No     Family Hx: The patient's family history includes Cancer in her brother, father, maternal grandfather, and mother; Diabetes in her brother; Heart attack in her sister; Heart disease in her brother, father, and sister;  Hypertension in an other family member; Hypothyroidism in her daughter; Myasthenia gravis in her mother; Parkinson's disease in her father; Stroke in her brother. There is no history  of Colon cancer.  ROS:   Please see the history of present illness.     All other systems reviewed and are negative.   Prior CV studies:   The following studies were reviewed today:  See above  Labs/Other Tests and Data Reviewed:    EKG:  No ECG reviewed.  Recent Labs: 07/26/2017: BUN 17; Creatinine, Ser 0.87; Hemoglobin 11.9; Platelets 186; Potassium 4.0; Sodium 141 09/10/2017: ALT 33   Recent Lipid Panel Lab Results  Component Value Date/Time   CHOL 151 05/02/2017 04:19 AM   TRIG 54 05/02/2017 04:19 AM   HDL 74 05/02/2017 04:19 AM   CHOLHDL 2.0 05/02/2017 04:19 AM   LDLCALC 66 05/02/2017 04:19 AM    Wt Readings from Last 3 Encounters:  06/24/18 216 lb (98 kg)  06/10/18 219 lb 12.8 oz (99.7 kg)  03/24/18 210 lb (95.3 kg)     Objective:    Vital Signs:  BP 106/65   Pulse 73   Ht _0  (1.651 m)   Wt 216 lb (98 kg)   BMI 35.94 kg/m    VITAL SIGNS:  reviewed GEN:  no acute distress EYES:  sclerae anicteric, EOMI - Extraocular Movements Intact RESPIRATORY:  normal respiratory effort, symmetric expansion CARDIOVASCULAR:  bilateral venous varicosities, no significant edema SKIN:  no rash, lesions or ulcers. MUSCULOSKELETAL:  no obvious deformities. NEURO:  alert and oriented x 3, no obvious focal deficit PSYCH:  normal affect  ASSESSMENT & PLAN:    1.  PFO with recurrent CVA and history of DVT: She was previously evaluated by hematology at [facility] who felt that lifelong anticoagulation and not PFO closure was the best management strategy.  She has been maintained on Xarelto 20 mg daily.  2.  Bilateral venous varicosities: I previously made referral to vascular surgery who evaluated her in July 2019.  Bilateral lower extremity reflux studies demonstrated significant reflux throughout  greater saphenous veins bilaterally.  However, they were quite small and it was not felt that ablation would be helpful.  She was not interested in injections at that time but would like to reconsider it.  Because of her fibromyalgia, she is unable to wear strict compression stockings as they lead to severe pain.  Different types of wraps were discussed.    COVID-19 Education: The signs and symptoms of COVID-19 were discussed with the patient and how to seek care for testing (follow up with PCP or arrange E-visit).  The importance of social distancing was discussed today.  Time:   Today, I have spent 15 minutes with the patient with telehealth technology discussing the above problems.     Medication Adjustments/Labs and Tests Ordered: Current medicines are reviewed at length with the patient today.  Concerns regarding medicines are outlined above.   Tests Ordered: No orders of the defined types were placed in this encounter.   Medication Changes: No orders of the defined types were placed in this encounter.   Disposition:  Follow up in 1 year(s)  Signed, Kate Sable, MD  06/24/2018 1:34 PM    [facility]

## 2018-07-03 ENCOUNTER — Encounter (INDEPENDENT_AMBULATORY_CARE_PROVIDER_SITE_OTHER): Payer: Self-pay | Admitting: Specialist

## 2018-07-03 DIAGNOSIS — M5136 Other intervertebral disc degeneration, lumbar region: Secondary | ICD-10-CM | POA: Diagnosis not present

## 2018-07-03 DIAGNOSIS — M47816 Spondylosis without myelopathy or radiculopathy, lumbar region: Secondary | ICD-10-CM | POA: Diagnosis not present

## 2018-07-03 DIAGNOSIS — M159 Polyosteoarthritis, unspecified: Secondary | ICD-10-CM | POA: Diagnosis not present

## 2018-07-03 NOTE — Telephone Encounter (Signed)
Called pt and asked her to return call.  She is taking topiramate 100mg  po bid, and ajovy.

## 2018-07-03 NOTE — Telephone Encounter (Signed)
Pt called in and stated her headaches are still the same there hasnt been a change since meds were increased and she is wanting to know what can be done.

## 2018-07-04 DIAGNOSIS — R3915 Urgency of urination: Secondary | ICD-10-CM | POA: Diagnosis not present

## 2018-07-04 DIAGNOSIS — N952 Postmenopausal atrophic vaginitis: Secondary | ICD-10-CM | POA: Diagnosis not present

## 2018-07-04 DIAGNOSIS — F5231 Female orgasmic disorder: Secondary | ICD-10-CM | POA: Diagnosis not present

## 2018-07-04 DIAGNOSIS — R32 Unspecified urinary incontinence: Secondary | ICD-10-CM | POA: Diagnosis not present

## 2018-07-04 DIAGNOSIS — N941 Unspecified dyspareunia: Secondary | ICD-10-CM | POA: Diagnosis not present

## 2018-07-07 MED ORDER — AIMOVIG 140 MG/ML ~~LOC~~ SOAJ: 140.0000 mg | SUBCUTANEOUS | 6 refills | Status: DC

## 2018-07-07 NOTE — Addendum Note (Signed)
Addended by: Suzzanne Cloud on: 07/07/2018 10:37 AM   Modules accepted: Orders

## 2018-07-07 NOTE — Telephone Encounter (Signed)
Called pt again.  She stated that she is taking topiramate 100mg  po BID and ajovy.  She continues with one migraine daily.  Asking for another change in injectible (CRGP).  Please advise.

## 2018-07-07 NOTE — Telephone Encounter (Signed)
I called the patient.  She is requesting to switch to another CGRP, she is currently using Ajovy for headache prevention.  She reports the last several months she has had an increase in headache, has been having daily headache, her typical headache may last 1 hour.  She continues taking Topamax 100 mg twice daily.  We discussed that bright light, sunlight is a trigger.  When she gets a headache she will lay down in a dark room.  She will continue wearing sunglasses when outside.  I will send in a prescription for Aimovig 140 mg monthly injection.  We discussed side effects of the medication, she indicates she does not have a latex allergy.  She will call for any dose adjustments.  She last had her Ajovy on 6/11, we discussed she needs to wait 30 days before starting Aimovig.

## 2018-07-08 ENCOUNTER — Telehealth: Payer: Self-pay | Admitting: *Deleted

## 2018-07-08 NOTE — Telephone Encounter (Signed)
Received approval for aimovig 140mg  /ml.  Good thru 01-22-19.  Fax confirmation received, [phone].

## 2018-07-08 NOTE — Telephone Encounter (Signed)
Initiated [facility] KEY [id number] for [place].  [phone].  Urgent request.

## 2018-07-14 ENCOUNTER — Other Ambulatory Visit (INDEPENDENT_AMBULATORY_CARE_PROVIDER_SITE_OTHER): Payer: Self-pay | Admitting: *Deleted

## 2018-07-14 ENCOUNTER — Other Ambulatory Visit: Payer: Self-pay

## 2018-07-14 DIAGNOSIS — I83893 Varicose veins of bilateral lower extremities with other complications: Secondary | ICD-10-CM

## 2018-07-14 DIAGNOSIS — R748 Abnormal levels of other serum enzymes: Secondary | ICD-10-CM

## 2018-07-16 ENCOUNTER — Telehealth (HOSPITAL_COMMUNITY): Payer: Self-pay | Admitting: Rehabilitation

## 2018-07-16 NOTE — Telephone Encounter (Signed)

## 2018-07-17 ENCOUNTER — Ambulatory Visit (INDEPENDENT_AMBULATORY_CARE_PROVIDER_SITE_OTHER): Payer: PPO | Admitting: Vascular Surgery

## 2018-07-17 ENCOUNTER — Other Ambulatory Visit: Payer: Self-pay

## 2018-07-17 ENCOUNTER — Ambulatory Visit (HOSPITAL_COMMUNITY)
Admission: RE | Admit: 2018-07-17 | Discharge: 2018-07-17 | Disposition: A | Discharge status: Home / Self Care | Payer: PPO | Source: Ambulatory Visit | Attending: Family | Admitting: Family

## 2018-07-17 ENCOUNTER — Encounter: Payer: Self-pay | Admitting: Vascular Surgery

## 2018-07-17 VITALS — BP 128/81 | HR 78 | Temp 98.5°F | Resp 16 | Ht 64.0 in | Wt 208.0 lb

## 2018-07-17 DIAGNOSIS — I872 Venous insufficiency (chronic) (peripheral): Secondary | ICD-10-CM

## 2018-07-17 DIAGNOSIS — I83893 Varicose veins of bilateral lower extremities with other complications: Secondary | ICD-10-CM | POA: Diagnosis not present

## 2018-07-17 NOTE — Progress Notes (Signed)
REASON FOR CONSULT:    Painful varicose veins.  The patient is self-referred.  ASSESSMENT & PLAN:   CHRONIC VENOUS INSUFFICIENCY: This patient does have evidence of superficial venous reflux bilaterally although not severe at this point.  We have discussed the importance of intermittent leg elevation and the proper positioning for this.  In addition she would like to try pantyhose style compression stockings and I have written her a prescription for these.  I encouraged her to avoid prolonged sitting and standing.  We discussed the importance of exercise specifically walking and water aerobics.  She does have scoliosis and some back issues and I think that water aerobics would be an excellent choice of exercise given these issues.  I think the leg elevation may also help her back some.  We also discussed the importance of weight management as central obesity especially increases venous pressure in the lower extremities.  If her symptoms progress in the future and certainly we could repeat her duplex and see if her superficial venous disease has progressed.  She knows to call if her symptoms progress.  [name], MD, FACS Beeper [phone] Office: [phone]  HPI:   Debbie Pineda is a pleasant 58 y.o. female, who presents with painful varicose veins bilaterally.  She tells me that she has had varicose veins in both lower extremities for over 30 years.  These have gradually progressed.  Her varicosities are more significant on the right side.  She describes aching pain and heaviness in her legs which is aggravated by sitting and standing and relieved somewhat with elevation.  She did have a DVT in the right lower extremity in 2014.  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  She has a patent foramen ovale.  She is on Xarelto for long-term anticoagulation given her patent foramen ovale.  She has tried compression stockings but states that these really have not fit well and  therefore she has not used them much.  She does elevate her legs.  She does not take ibuprofen as she was told not to with her being on Xarelto.  She has borderline diabetes.  Past Medical History:  Diagnosis Date  . ADD (attention deficit disorder)    takes Adderall daily  . Arthritis    "all over"  . Cerebral infarction ([place]) 10/30/2011  . Cerebrovascular disease 08/14/2016  . Chronic back pain    "all over"  . Chronic low back pain 01/11/2016  . Complication of anesthesia    tends to have hypotension when NPO and post-anesthesia  . Constipation    takes stool softener daily  . Degenerative disk disease   . Degenerative joint disease   . Depression    takes Cymbalta daily for pain per pt  . Diabetes mellitus without complication ([place])   . DVT (deep venous thrombosis) (HCC)    RLE  . Family history of adverse reaction to anesthesia    a family member woke up during surgery; "think it was my mom"  . Fibromyalgia   . Generalized osteoarthritis of multiple sites 11/04/2013  . GERD (gastroesophageal reflux disease)   . Heart palpitations 12/14/2013  . History of blood clots    superficial  . Hypoglycemia   . Hypothyroid    takes Synthroid daily  . Incomplete emptying of bladder   . Insomnia    takes Trazodone nightly  . Iron deficiency anemia    takes Ferrous Sulfate daily  . Joint pain   .  Joint swelling    knees and ankles  . Memory disorder 08/14/2016  . Morbid obesity ([place])   . Nausea    takes Zofran as needed.Seeing GI doc  . Neck pain 05/25/2016  . OSA on CPAP    tested more than 5 yrs ago.    . Osteoarthritis   . PFO (patent foramen ovale)   . Primary osteoarthritis of both feet 05/29/2016   Right bunionectomy August 2017 by Dr. Sharol Given  . Scoliosis   . Sleep apnea   . Stroke [facility]) "several"   right foot weakness; memory issues, black spot right visual field since" (03/23/2015)  . Thrombophlebitis   . Trochanteric bursitis of both hips 05/25/2016  . Unilateral  primary osteoarthritis, right knee 05/29/2016  . Urinary urgency 04/19/2016  . Vein disorder 11/29/2010    Family History  Problem Relation Age of Onset  . Heart disease Father   . Cancer Father   . Parkinson's disease Father   . Cancer Mother        skin cancer   . Myasthenia gravis Mother   . Heart disease Brother   . Cancer Brother   . Diabetes Brother   . Stroke Brother   . Heart disease Sister   . Heart attack Sister   . Cancer Maternal Grandfather   . Hypothyroidism Daughter   . Hypertension Other   . Colon cancer Neg Hx     SOCIAL HISTORY: Social History   Socioeconomic History  . Marital status: Married    Spouse name: Jeneen Rinks  . Number of children: 1  . Years of education: 36  . Highest education level: Not on file  Occupational History  . Occupation: Disability  . Occupation: formerly Therapist, sports, [employer]  Social Needs  . Financial resource strain: Not on file  . Food insecurity    Worry: Not on file    Inability: Not on file  . Transportation needs    Medical: Not on file    Non-medical: Not on file  Tobacco Use  . Smoking status: Former Smoker    Packs/day: 0.75    Years: 8.00    Pack years: 6.00    Types: Cigarettes    Quit date: 12/01/1990    Years since quitting: 27.6  . Smokeless tobacco: Never Used  . Tobacco comment: quit smoking in the 1990s  Substance and Sexual Activity  . Alcohol use: No    Comment: 03/23/2015 "stopped drinking in 2012 w/gastric bypass; drank socially before bypass"  . Drug use: No  . Sexual activity: Not Currently    Birth control/protection: Surgical  Lifestyle  . Physical activity    Days per week: Not on file    Minutes per session: Not on file  . Stress: Not on file  Relationships  . Social Herbalist on phone: Not on file    Gets together: Not on file    Attends religious service: Not on file    Active member of club or organization: Not on file    Attends meetings of clubs or organizations: Not on file     Relationship status: Not on file  . Intimate partner violence    Fear of current or ex partner: Not on file    Emotionally abused: Not on file    Physically abused: Not on file    Forced sexual activity: Not on file  Other Topics Concern  . Not on file  Social History Narrative   Lives with husband  Caffeine use: No soda   Mainly water, drinks decaf tea   Right handed    Allergies  Allergen Reactions  . Lyrica [Pregabalin] Other (See Comments)    SOB, lower extremity edema and weight gain  . Flexeril [Cyclobenzaprine Hcl] Other (See Comments)    Numbness of extremities  . Belsomra [Suvorexant] Other (See Comments)    unknown  . Morphine And Related Itching    Upper torso  . Sulfamethoxazole-Trimethoprim Itching and Rash    Bactrim  . Tape Itching and Rash    Please use "paper" tape Rash if left on longer than 24 hrs    Current Outpatient Medications  Medication Sig Dispense Refill  . baclofen (LIORESAL) 10 MG tablet Take 10 mg by mouth 3 (three) times daily.   1  . donepezil (ARICEPT) 10 MG tablet Take 1 tablet (10 mg total) by mouth at bedtime. 90 tablet 1  . DULoxetine (CYMBALTA) 60 MG capsule Take 60 mg by mouth 2 (two) times daily.  11  . Erenumab-aooe (AIMOVIG) 140 MG/ML SOAJ Inject 140 mg into the skin every 30 (thirty) days. 1 pen 6  . fluconazole (DIFLUCAN) 150 MG tablet Take 150 mg by mouth as needed.    . folic acid (FOLVITE) 678 MCG tablet Take 400 mcg by mouth daily.    Marland Kitchen gabapentin (NEURONTIN) 800 MG tablet Take 1 tablet (800 mg total) by mouth 3 (three) times daily.    Marland Kitchen KRILL OIL PO Take 350 mg by mouth.     Marland Kitchen L-Methylfolate-B12-B6-B2 (CEREFOLIN) 06-22-48-5 MG TABS Take 1 tablet by mouth daily. 90 each 3  . levothyroxine (SYNTHROID) 150 MCG tablet Take 150 mcg by mouth daily before breakfast.    . methylphenidate (CONCERTA) 27 MG PO CR tablet Take 1 tablet (27 mg total) by mouth 2 (two) times a day. 60 tablet 0  . mirabegron ER (MYRBETRIQ) 50 MG TB24 tablet  Take 50 mg by mouth daily.    . Multiple Minerals-Vitamins (CAL-MAG-ZINC-D PO) Take 3 tablets by mouth daily. Take 2 tablet at 1200 and 1 tablet at 1600  Calcium 1000 mg Vit D 600 iu Magnesium 400 mg Zinc 15 mg    . Multiple Vitamins-Minerals (ONE-A-DAY WOMENS PETITES PO) Take by mouth daily.    Marland Kitchen omeprazole (PRILOSEC) 40 MG capsule Take 1 capsule by mouth twice daily. 60 capsule 5  . ondansetron (ZOFRAN) 4 MG tablet Take 4 mg by mouth every 8 (eight) hours as needed for nausea or vomiting.    . rivaroxaban (XARELTO) 20 MG TABS tablet Take 1 tablet (20 mg total) by mouth daily with supper. (Patient taking differently: Take 20 mg by mouth at bedtime. ) 30 tablet 1  . topiramate (TOPAMAX) 100 MG tablet Take 1 tablet (100 mg total) by mouth 2 (two) times daily. 60 tablet 5  . traZODone (DESYREL) 100 MG tablet Take 200 mg by mouth at bedtime as needed for sleep (depends on insomnia).     . vitamin C (ASCORBIC ACID) 500 MG tablet Take 500 mg by mouth daily.    . Vitamin D, Ergocalciferol, (DRISDOL) 50000 UNITS CAPS Take 50,000 Units by mouth every 7 (seven) days. Mondays    . amoxicillin (AMOXIL) 500 MG capsule Take 500 mg by mouth as needed (Dental procedures).    . Cyanocobalamin (VITAMIN B-12) 5000 MCG SUBL Place under the tongue daily.    . diclofenac sodium (VOLTAREN) 1 % GEL Apply 4 g topically 4 (four) times daily as needed (PAIN). 5 Tube  1  . guaifenesin (HUMIBID E) 400 MG TABS tablet Take 1,200 mg by mouth daily.    Marland Kitchen NARCAN 4 MG/0.1ML LIQD nasal spray kit Place 1 spray into the nose as needed (over dose).     . Tapentadol HCl (NUCYNTA) 100 MG TABS Take 50 mg by mouth 3 (three) times daily. 50 mg TID PRN for chronic back pain, fibromyalgia     No current facility-administered medications for this visit.     REVIEW OF SYSTEMS:  '[X]'  denotes positive finding, '[ ]'  denotes negative finding Cardiac  Comments:  Chest pain or chest pressure:    Shortness of breath upon exertion:    Short of  breath when lying flat:    Irregular heart rhythm:     X PFO  Vascular    Pain in calf, thigh, or hip brought on by ambulation: X   Pain in feet at night that wakes you up from your sleep:     Blood clot in your veins:    Leg swelling:  X       Pulmonary    Oxygen at home:    Productive cough:     Wheezing:         Neurologic    Sudden weakness in arms or legs:     Sudden numbness in arms or legs:     Sudden onset of difficulty speaking or slurred speech:    Temporary loss of vision in one eye:     Problems with dizziness:         Gastrointestinal    Blood in stool:     Vomited blood:         Genitourinary    Burning when urinating:     Blood in urine:        Psychiatric    Major depression:         Hematologic    Bleeding problems:    Problems with blood clotting too easily:        Skin    Rashes or ulcers:        Constitutional    Fever or chills:     PHYSICAL EXAM:   Vitals:   07/17/18 1353  BP: 128/81  Pulse: 78  Resp: 16  Temp: 98.5 F (36.9 C)  TempSrc: Temporal  SpO2: 98%  Weight: 208 lb (94.3 kg)  Height: '5\' 4"'  (1.626 m)    GENERAL: The patient is a well-nourished female, in no acute distress. The vital signs are documented above. CARDIAC: There is a regular rate and rhythm.  VASCULAR: I do not detect carotid bruits. She has palpable posterior tibial pulses bilaterally.  She has a right dorsalis pedis pulse.  I cannot palpate a left dorsalis pedis pulse.  Both feet are warm and well-perfused. She has some varicose veins along medial aspect of her right thigh and right calf.  To a lesser degree she has varicose veins in her medial left leg.  I looked at her great saphenous veins myself with the SonoSite and these are not significantly dilated.  RIGHT LEG      LEFT LEG      PULMONARY: There is good air exchange bilaterally without wheezing or rales. ABDOMEN: Soft and non-tender with normal pitched bowel sounds.  MUSCULOSKELETAL: There  are no major deformities or cyanosis. NEUROLOGIC: No focal weakness or paresthesias are detected. SKIN: There are no ulcers or rashes noted. PSYCHIATRIC: The patient has a normal affect.  DATA:    VENOUS  DUPLEX: I have independently interpreted her venous duplex scan today.   On the right side, there is no evidence of DVT.  There is some reflux noted in the right great saphenous vein in the proximal middle and distal segments although the vein is not especially dilated.  There is no deep venous reflux on the right.  On the left side there is some deep venous reflux in the common femoral vein.  There is superficial venous reflux in the proximal and mid great saphenous vein although this is not dilated.  The small saphenous vein has some reflux proximally but is not dilated.  There is no DVT on the left.

## 2018-07-31 DIAGNOSIS — Z6835 Body mass index (BMI) 35.0-35.9, adult: Secondary | ICD-10-CM | POA: Diagnosis not present

## 2018-07-31 DIAGNOSIS — L292 Pruritus vulvae: Secondary | ICD-10-CM | POA: Diagnosis not present

## 2018-07-31 DIAGNOSIS — Z124 Encounter for screening for malignant neoplasm of cervix: Secondary | ICD-10-CM | POA: Diagnosis not present

## 2018-07-31 DIAGNOSIS — Z01419 Encounter for gynecological examination (general) (routine) without abnormal findings: Secondary | ICD-10-CM | POA: Diagnosis not present

## 2018-07-31 DIAGNOSIS — R399 Unspecified symptoms and signs involving the genitourinary system: Secondary | ICD-10-CM | POA: Diagnosis not present

## 2018-08-01 DIAGNOSIS — M545 Low back pain: Secondary | ICD-10-CM | POA: Diagnosis not present

## 2018-08-21 ENCOUNTER — Other Ambulatory Visit: Payer: Self-pay

## 2018-08-21 ENCOUNTER — Ambulatory Visit: Payer: Self-pay

## 2018-08-21 ENCOUNTER — Ambulatory Visit (INDEPENDENT_AMBULATORY_CARE_PROVIDER_SITE_OTHER): Payer: PPO | Admitting: Orthopedic Surgery

## 2018-08-21 ENCOUNTER — Encounter: Payer: Self-pay | Admitting: Orthopedic Surgery

## 2018-08-21 VITALS — Ht 64.0 in | Wt 208.0 lb

## 2018-08-21 DIAGNOSIS — M205X1 Other deformities of toe(s) (acquired), right foot: Secondary | ICD-10-CM

## 2018-08-21 DIAGNOSIS — M21611 Bunion of right foot: Secondary | ICD-10-CM

## 2018-08-21 DIAGNOSIS — M79672 Pain in left foot: Secondary | ICD-10-CM | POA: Diagnosis not present

## 2018-08-21 DIAGNOSIS — M79671 Pain in right foot: Secondary | ICD-10-CM

## 2018-08-21 NOTE — Progress Notes (Signed)
Office Visit Note   Patient: Debbie Pineda           Date of Birth: 1960-04-09           MRN: [id number] Visit Date: 08/21/2018              Requested by: Jake Samples, PA-C [address] [place],  [place] [address] PCP: Jake Samples, PA-C  Chief Complaint  Patient presents with  . Left Foot - Pain  . Right Foot - Pain      HPI: Patient is a 58 year old woman who states that she is 1 year status post bunion surgery right great toe.  She states she still has bunion deformity has overlapping of the great toe and second toe and complains of pain worse over the past 6 months.  She has pain with activities of daily living.  On the left foot she complains of arthritic pain through the midfoot.  Assessment & Plan: Visit Diagnoses:  1. Bilateral foot pain   2. Bunion, right   3. Acquired claw toe, right     Plan: Discussed that for the left foot she could try a stiff soled sneaker with a good arch support orthotic to unload the midfoot.  Discussed that with the right foot she could continue conservative therapy versus surgical intervention.  Discussed that surgery would include Weil osteotomies for the second and third metatarsal to decrease clawing of the second and third toes and to better align the metatarsal heads and then a opening wedge osteotomy of the first metatarsal Akin osteotomy of the proximal phalanx of the great toe.  Risks and benefits were discussed including infection persistent pain need for additional surgery.  Patient states she understands wished to proceed at this time.  Follow-Up Instructions: Return in about 1 week (around 08/28/2018).   Ortho Exam  Patient is alert, oriented, no adenopathy, well-dressed, normal affect, normal respiratory effort. Examination patient is a good dorsalis pedis pulse good ankle and subtalar motion.  She has a prominent second and third metatarsal head with clawing of the second and third toes and has pain to  palpation beneath the second and third metatarsals.  She has overlapping of the second toe on top of the great toe.  She has no pain with passive range of motion of the MTP joint.  She is tender to palpation over the bunion.  Imaging: Xr Foot 2 Views Left  Result Date: 08/21/2018 2 view radiographs of the left foot shows a long second and third metatarsal the MTP joint spaces are congruent there is some mild bony spurs through the midfoot and ankle.  Xr Foot 2 Views Right  Result Date: 08/21/2018 2 view radiographs of the right foot shows a 13 degree first intermetatarsal angle previous silver osteotomy with a congruent MTP joint and a long second and third metatarsal.  With 40 degree hallux valgus angle.  No images are attached to the encounter.  Labs: Lab Results  Component Value Date   HGBA1C 5.0 05/02/2017   HGBA1C 5.4 10/31/2011   ESRSEDRATE 9 09/10/2017   ESRSEDRATE 9 03/25/2017   LABURIC 4.5 03/25/2017   REPTSTATUS 05/03/2017 FINAL 05/01/2017   CULT MULTIPLE SPECIES PRESENT, SUGGEST RECOLLECTION (A) 05/01/2017     Lab Results  Component Value Date   ALBUMIN 3.5 07/26/2017   ALBUMIN 3.7 05/03/2017   ALBUMIN 3.4 (L) 05/02/2017   LABURIC 4.5 03/25/2017    Lab Results  Component Value Date   MG 1.9  01/30/2013   MG 2.0 04/27/2011   MG 2.1 01/26/2011   No results found for: VD25OH  No results found for: PREALBUMIN CBC EXTENDED Latest Ref Rng & Units 07/26/2017 05/01/2017 05/01/2017  WBC 4.0 - 10.5 K/uL 5.0 - 8.5  RBC 3.87 - 5.11 MIL/uL 3.64(L) - 4.07  HGB 12.0 - 15.0 g/dL 11.9(L) 12.9 13.0  HCT 36.0 - 46.0 % 36.3 38.0 40.2  PLT 150 - 400 K/uL 186 - 230  NEUTROABS 1.7 - 7.7 K/uL 2.3 - 4.8  LYMPHSABS 0.7 - 4.0 K/uL 2.1 - 2.6     Body mass index is 35.7 kg/m.  Orders:  Orders Placed This Encounter  Procedures  . XR Foot 2 Views Right  . XR Foot 2 Views Left   No orders of the defined types were placed in this encounter.    Procedures: No procedures  performed  Clinical Data: No additional findings.  ROS:  All other systems negative, except as noted in the HPI. Review of Systems  Objective: Vital Signs: Ht 5\' 4"  (1.626 m)   Wt 208 lb (94.3 kg)   BMI 35.70 kg/m   Specialty Comments:  No specialty comments available.  PMFS History: Patient Active Problem List   Diagnosis Date Noted  . Attention deficit hyperactivity disorder (ADHD) 01/12/2018  . Insomnia 01/12/2018  . Elevated liver enzymes 09/10/2017  . History of diabetes mellitus 06/06/2017  . Primary osteoarthritis of right hand 06/06/2017  . Transaminasemia   . TIA (transient ischemic attack) 05/01/2017  . Dysphasia 05/01/2017  . Chronic pain syndrome 05/01/2017  . Confusion   . Presence of right artificial knee joint 09/17/2016  . H/O total knee replacement, right 08/15/2016  . Presence of retained hardware   . Memory disorder 08/14/2016  . Cerebrovascular disease 08/14/2016  . Unilateral primary osteoarthritis, right knee 05/29/2016  . Primary osteoarthritis of both feet 05/29/2016  . Trochanteric bursitis of both hips 05/25/2016  . Neck pain 05/25/2016  . Urinary urgency 04/19/2016  . Chronic low back pain 01/11/2016  . Depression 11/29/2015  . 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 Heart palpitations 12/14/2013  . Generalized osteoarthritis of multiple sites 11/04/2013  . Cerebral infarction ([place]) 10/30/2011  . PFO (patent foramen ovale) 10/30/2011  . Bradycardia 10/30/2011  . Chest pain 10/30/2011  . Hypothyroid   . Thrombophlebitis   . Sleep apnea   . Fibromyalgia   . Status post bariatric surgery 12/07/2010  . Vein disorder 11/29/2010  . S/P total hysterectomy and bilateral salpingo-oophorectomy 11/29/2010  . S/P exploratory laparotomy 11/29/2010  . S/P cholecystectomy 11/29/2010  . S/P ACL surgery 11/29/2010  . Morbid obesity ([place]) 11/10/2010   Past Medical History:  Diagnosis Date  . ADD (attention deficit disorder)    takes  Adderall daily  . Arthritis    "all over"  . Cerebral infarction ([place]) 10/30/2011  . Cerebrovascular disease 08/14/2016  . Chronic back pain    "all over"  . Chronic low back pain 01/11/2016  . Complication of anesthesia    tends to have hypotension when NPO and post-anesthesia  . Constipation    takes stool softener daily  . Degenerative disk disease   . Degenerative joint disease   . Depression    takes Cymbalta daily for pain per pt  . Diabetes mellitus without complication ([place])   . DVT (deep venous thrombosis) ([facility])    RLE  . Family history of adverse reaction to anesthesia    a family member woke up during  surgery; "think it was my mom"  . Fibromyalgia   . Generalized osteoarthritis of multiple sites 11/04/2013  . GERD (gastroesophageal reflux disease)   . Heart palpitations 12/14/2013  . History of blood clots    superficial  . Hypoglycemia   . Hypothyroid    takes Synthroid daily  . Incomplete emptying of bladder   . Insomnia    takes Trazodone nightly  . Iron deficiency anemia    takes Ferrous Sulfate daily  . Joint pain   . Joint swelling    knees and ankles  . Memory disorder 08/14/2016  . Morbid obesity ([place])   . Nausea    takes Zofran as needed.Seeing GI doc  . Neck pain 05/25/2016  . OSA on CPAP    tested more than 5 yrs ago.    . Osteoarthritis   . PFO (patent foramen ovale)   . Primary osteoarthritis of both feet 05/29/2016   Right bunionectomy August 2017 by Dr. Sharol Given  . Scoliosis   . Sleep apnea   . Stroke [facility]) "several"   right foot weakness; memory issues, black spot right visual field since" (03/23/2015)  . Thrombophlebitis   . Trochanteric bursitis of both hips 05/25/2016  . Unilateral primary osteoarthritis, right knee 05/29/2016  . Urinary urgency 04/19/2016  . Vein disorder 11/29/2010    Family History  Problem Relation Age of Onset  . Heart disease Father   . Cancer Father   . Parkinson's disease Father   . Cancer Mother        skin cancer    . Myasthenia gravis Mother   . Heart disease Brother   . Cancer Brother   . Diabetes Brother   . Stroke Brother   . Heart disease Sister   . Heart attack Sister   . Cancer Maternal Grandfather   . Hypothyroidism Daughter   . Hypertension Other   . Colon cancer Neg Hx     Past Surgical History:  Procedure Laterality Date  . BONE EXCISION Right 08/29/2017   Procedure: right trapezium excision;  Surgeon: Daryll Brod, MD;  Location: [facility];  Service: Orthopedics;  Laterality: Right;  . BUNIONECTOMY Right 08/2015  . CARDIAC CATHETERIZATION     2008.  "it was fine" (not sure why she had it done, and doesn't know where)  . CARPOMETACARPEL SUSPENSION PLASTY Right 08/29/2017   Procedure: SUSPENSION PLASTY RIGHT THUMB;  Surgeon: Daryll Brod, MD;  Location: [facility];  Service: Orthopedics;  Laterality: Right;  . COLONOSCOPY N/A 03/25/2013   Procedure: COLONOSCOPY;  Surgeon: Rogene [place], MD;  [facility];  Service: Endoscopy;  Laterality: N/A;  930  . ESOPHAGOGASTRODUODENOSCOPY    . EXPLORATORY LAPAROTOMY     "took fallopian tubes out"  . JOINT REPLACEMENT     bil knee   . KNEE ARTHROPLASTY    . KNEE ARTHROSCOPY Left   . KNEE ARTHROSCOPY W/ ACL RECONSTRUCTION Right    "added pins"  . LAPAROSCOPIC CHOLECYSTECTOMY  ~ 2001  . 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 SPINAL CORD STIMULATOR INSERTION N/A 04/18/2017   Procedure: LUMBAR SPINAL CORD STIMULATOR INSERTION;  Surgeon: Clydell Hakim, MD;  Location: [facility];  Service: Neurosurgery;  Laterality: N/A;  LUMBAR SPINAL CORD STIMULATOR INSERTION  . TENDON TRANSFER Right 08/29/2017   Procedure: right abductor pollicis longus transfer;  Surgeon: Daryll Brod, MD;  Location: [facility];  Service: Orthopedics;  Laterality: Right;  . TOTAL KNEE ARTHROPLASTY Left 03/23/2015  Procedure: TOTAL KNEE ARTHROPLASTY;  Surgeon: Newt Minion, MD;  Location: [facility];  Service: Orthopedics;  Laterality:  Left;  . TOTAL KNEE ARTHROPLASTY Right 08/15/2016   Procedure: RIGHT TOTAL KNEE ARTHROPLASTY, REMOVAL ACL SCREWS;  Surgeon: Newt Minion, MD;  Location: [facility];  Service: Orthopedics;  Laterality: Right;  . TOTAL KNEE ARTHROPLASTY WITH HARDWARE REMOVAL Right   . VAGINAL HYSTERECTOMY    . VARICOSE VEIN SURGERY Right X 2   Social History   Occupational History  . Occupation: Disability  . Occupation: formerly Therapist, sports, [employer]  Tobacco Use  . Smoking status: Former Smoker    Packs/day: 0.75    Years: 8.00    Pack years: 6.00    Types: Cigarettes    Quit date: 12/01/1990    Years since quitting: 27.7  . Smokeless tobacco: Never Used  . Tobacco comment: quit smoking in the 1990s  Substance and Sexual Activity  . Alcohol use: No    Comment: 03/23/2015 "stopped drinking in 2012 w/gastric bypass; drank socially before bypass"  . Drug use: No  . Sexual activity: Not Currently    Birth control/protection: Surgical

## 2018-09-02 DIAGNOSIS — L859 Epidermal thickening, unspecified: Secondary | ICD-10-CM | POA: Diagnosis not present

## 2018-09-02 DIAGNOSIS — L820 Inflamed seborrheic keratosis: Secondary | ICD-10-CM | POA: Diagnosis not present

## 2018-09-02 DIAGNOSIS — D225 Melanocytic nevi of trunk: Secondary | ICD-10-CM | POA: Diagnosis not present

## 2018-09-02 DIAGNOSIS — Z1283 Encounter for screening for malignant neoplasm of skin: Secondary | ICD-10-CM | POA: Diagnosis not present

## 2018-09-02 DIAGNOSIS — L720 Epidermal cyst: Secondary | ICD-10-CM | POA: Diagnosis not present

## 2018-09-15 ENCOUNTER — Encounter: Payer: Self-pay | Admitting: Specialist

## 2018-09-18 ENCOUNTER — Telehealth: Payer: Self-pay

## 2018-09-18 ENCOUNTER — Ambulatory Visit: Payer: Self-pay

## 2018-09-18 ENCOUNTER — Other Ambulatory Visit: Payer: Self-pay

## 2018-09-18 ENCOUNTER — Ambulatory Visit (INDEPENDENT_AMBULATORY_CARE_PROVIDER_SITE_OTHER): Payer: PPO | Admitting: Specialist

## 2018-09-18 ENCOUNTER — Encounter: Payer: Self-pay | Admitting: Specialist

## 2018-09-18 VITALS — BP 101/65 | HR 72 | Ht 64.0 in | Wt 208.0 lb

## 2018-09-18 DIAGNOSIS — M25552 Pain in left hip: Secondary | ICD-10-CM

## 2018-09-18 DIAGNOSIS — M79652 Pain in left thigh: Secondary | ICD-10-CM

## 2018-09-18 DIAGNOSIS — M2142 Flat foot [pes planus] (acquired), left foot: Secondary | ICD-10-CM

## 2018-09-18 DIAGNOSIS — R6889 Other general symptoms and signs: Secondary | ICD-10-CM | POA: Diagnosis not present

## 2018-09-18 DIAGNOSIS — Z20822 Contact with and (suspected) exposure to covid-19: Secondary | ICD-10-CM

## 2018-09-18 NOTE — Patient Instructions (Signed)
Plan: Avoid bending, stooping and avoid lifting weights greater than 10 lbs. Avoid prolong standing and walking. Avoid frequent bending and stooping  No lifting greater than 10 lbs. May use ice or moist heat for pain. Weight loss is of benefit. Handicap license is approved. Dr. Romona Curls secretary/Assistant will call to arrange for EMG/NCV of the left leg to assess for a mid to upper lumbar radiculopathy. Obtain bilateral shoe insert for bilateral planovalgus foot deformity.

## 2018-09-18 NOTE — Telephone Encounter (Signed)
Patient is requesting a letter from Dr, so she can attend the [facility]. They are requesting letters from your doctor stating its good for their health during and due to the pandemic. Patient also is requesting if the letter is given could we mail it to her.

## 2018-09-18 NOTE — Progress Notes (Signed)
Office Visit Note   Patient: Debbie Pineda           Date of Birth: Nov 02, 1960           MRN: [id number] Visit Date: 09/18/2018              Requested by: Jake Samples, PA-C [address] [place],  [place] [address] PCP: Jake Samples, PA-C   Assessment & Plan: Visit Diagnoses:  1. Left thigh pain   2. Pain in left hip   3. Pes planovalgus, acquired, left     Plan: Avoid bending, stooping and avoid lifting weights greater than 10 lbs. Avoid prolong standing and walking. Avoid frequent bending and stooping  No lifting greater than 10 lbs. May use ice or moist heat for pain. Weight loss is of benefit. Handicap license is approved. Dr. Romona Curls secretary/Assistant will call to arrange for EMG/NCV of the left leg to assess for a mid to upper lumbar radiculopathy. Obtain bilateral shoe insert for bilateral planovalgus foot deformity.  Follow-Up Instructions: No follow-ups on file.   Orders:  Orders Placed This Encounter  Procedures  . XR HIP UNILAT W OR W/O PELVIS 2-3 VIEWS LEFT  . Ambulatory referral to Physical Medicine Rehab   No orders of the defined types were placed in this encounter.     Procedures: No procedures performed   Clinical Data: No additional findings.   Subjective: Chief Complaint  Patient presents with  . Left Leg - Pain    58 year old female with history of left buttock and left upper thigh pain laterally and pain in the left lateral thigh pain with sleeping on the left side for the last several months. She is doing some left hip lateral rotation exercises and the butterfly that helps the pain. No bowel or bladder changes, some urinary incontinence or urgency of urination at night especially with use of sleep aid. Has decrease sensation with sense of voiding and also pain with being amorous. Unable to walk a  Mile, has a worsening left planovalgus.   Review of Systems   Objective: Vital Signs: BP 101/65 (BP Location:  Left Arm, Patient Position: Sitting)   Pulse 72   Ht 5\' 4"  (1.626 m)   Wt 208 lb (94.3 kg)   BMI 35.70 kg/m   Physical Exam  Ortho Exam  Specialty Comments:  No specialty comments available.  Imaging: No results found.   PMFS History: Patient Active Problem List   Diagnosis Date Noted  . Attention deficit hyperactivity disorder (ADHD) 01/12/2018  . Insomnia 01/12/2018  . Elevated liver enzymes 09/10/2017  . History of diabetes mellitus 06/06/2017  . Primary osteoarthritis of right hand 06/06/2017  . Transaminasemia   . TIA (transient ischemic attack) 05/01/2017  . Dysphasia 05/01/2017  . Chronic pain syndrome 05/01/2017  . Confusion   . Presence of right artificial knee joint 09/17/2016  . H/O total knee replacement, right 08/15/2016  . Presence of retained hardware   . Memory disorder 08/14/2016  . Cerebrovascular disease 08/14/2016  . Unilateral primary osteoarthritis, right knee 05/29/2016  . Primary osteoarthritis of both feet 05/29/2016  . Trochanteric bursitis of both hips 05/25/2016  . Neck pain 05/25/2016  . Urinary urgency 04/19/2016  . Chronic low back pain 01/11/2016  . Depression 11/29/2015  . 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 Heart palpitations 12/14/2013  . Generalized osteoarthritis of multiple sites 11/04/2013  . Cerebral infarction ([place]) 10/30/2011  . PFO (patent foramen ovale) 10/30/2011  .  Bradycardia 10/30/2011  . Chest pain 10/30/2011  . Hypothyroid   . Thrombophlebitis   . Sleep apnea   . Fibromyalgia   . Status post bariatric surgery 12/07/2010  . Vein disorder 11/29/2010  . S/P total hysterectomy and bilateral salpingo-oophorectomy 11/29/2010  . S/P exploratory laparotomy 11/29/2010  . S/P cholecystectomy 11/29/2010  . S/P ACL surgery 11/29/2010  . Morbid obesity ([place]) 11/10/2010   Past Medical History:  Diagnosis Date  . ADD (attention deficit disorder)    takes Adderall daily  . Arthritis    "all over"  . Cerebral  infarction ([place]) 10/30/2011  . Cerebrovascular disease 08/14/2016  . Chronic back pain    "all over"  . Chronic low back pain 01/11/2016  . Complication of anesthesia    tends to have hypotension when NPO and post-anesthesia  . Constipation    takes stool softener daily  . Degenerative disk disease   . Degenerative joint disease   . Depression    takes Cymbalta daily for pain per pt  . Diabetes mellitus without complication ([place])   . DVT (deep venous thrombosis) ([facility])    RLE  . Family history of adverse reaction to anesthesia    a family member woke up during surgery; "think it was my mom"  . Fibromyalgia   . Generalized osteoarthritis of multiple sites 11/04/2013  . GERD (gastroesophageal reflux disease)   . Heart palpitations 12/14/2013  . History of blood clots    superficial  . Hypoglycemia   . Hypothyroid    takes Synthroid daily  . Incomplete emptying of bladder   . Insomnia    takes Trazodone nightly  . Iron deficiency anemia    takes Ferrous Sulfate daily  . Joint pain   . Joint swelling    knees and ankles  . Memory disorder 08/14/2016  . Morbid obesity ([place])   . Nausea    takes Zofran as needed.Seeing GI doc  . Neck pain 05/25/2016  . OSA on CPAP    tested more than 5 yrs ago.    . Osteoarthritis   . PFO (patent foramen ovale)   . Primary osteoarthritis of both feet 05/29/2016   Right bunionectomy August 2017 by Dr. Sharol Given  . Scoliosis   . Sleep apnea   . Stroke [facility]) "several"   right foot weakness; memory issues, black spot right visual field since" (03/23/2015)  . Thrombophlebitis   . Trochanteric bursitis of both hips 05/25/2016  . Unilateral primary osteoarthritis, right knee 05/29/2016  . Urinary urgency 04/19/2016  . Vein disorder 11/29/2010    Family History  Problem Relation Age of Onset  . Heart disease Father   . Cancer Father   . Parkinson's disease Father   . Cancer Mother        skin cancer   . Myasthenia gravis Mother   . Heart disease Brother    . Cancer Brother   . Diabetes Brother   . Stroke Brother   . Heart disease Sister   . Heart attack Sister   . Cancer Maternal Grandfather   . Hypothyroidism Daughter   . Hypertension Other   . Colon cancer Neg Hx     Past Surgical History:  Procedure Laterality Date  . BONE EXCISION Right 08/29/2017   Procedure: right trapezium excision;  Surgeon: Daryll Brod, MD;  Location: [facility];  Service: Orthopedics;  Laterality: Right;  . BUNIONECTOMY Right 08/2015  . CARDIAC CATHETERIZATION     2008.  "it was  fine" (not sure why she had it done, and doesn't know where)  . CARPOMETACARPEL SUSPENSION PLASTY Right 08/29/2017   Procedure: SUSPENSION PLASTY RIGHT THUMB;  Surgeon: Daryll Brod, MD;  Location: [facility];  Service: Orthopedics;  Laterality: Right;  . COLONOSCOPY N/A 03/25/2013   Procedure: COLONOSCOPY;  Surgeon: Rogene Houston, MD;  [facility];  Service: Endoscopy;  Laterality: N/A;  930  . ESOPHAGOGASTRODUODENOSCOPY    . EXPLORATORY LAPAROTOMY     "took fallopian tubes out"  . JOINT REPLACEMENT     bil knee   . KNEE ARTHROPLASTY    . KNEE ARTHROSCOPY Left   . KNEE ARTHROSCOPY W/ ACL RECONSTRUCTION Right    "added pins"  . LAPAROSCOPIC CHOLECYSTECTOMY  ~ 2001  . 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 SPINAL CORD STIMULATOR INSERTION N/A 04/18/2017   Procedure: LUMBAR SPINAL CORD STIMULATOR INSERTION;  Surgeon: Clydell Hakim, MD;  Location: [facility];  Service: Neurosurgery;  Laterality: N/A;  LUMBAR SPINAL CORD STIMULATOR INSERTION  . TENDON TRANSFER Right 08/29/2017   Procedure: right abductor pollicis longus transfer;  Surgeon: Daryll Brod, MD;  Location: [place] [facility];  Service: Orthopedics;  Laterality: Right;  . TOTAL KNEE ARTHROPLASTY Left 03/23/2015   Procedure: TOTAL KNEE ARTHROPLASTY;  Surgeon: Newt Minion, MD;  Location: [facility];  Service: Orthopedics;  Laterality: Left;  . TOTAL KNEE ARTHROPLASTY Right 08/15/2016    Procedure: RIGHT TOTAL KNEE ARTHROPLASTY, REMOVAL ACL SCREWS;  Surgeon: Newt Minion, MD;  Location: [facility];  Service: Orthopedics;  Laterality: Right;  . TOTAL KNEE ARTHROPLASTY WITH HARDWARE REMOVAL Right   . VAGINAL HYSTERECTOMY    . VARICOSE VEIN SURGERY Right X 2   Social History   Occupational History  . Occupation: Disability  . Occupation: formerly Therapist, sports, [employer]  Tobacco Use  . Smoking status: Former Smoker    Packs/day: 0.75    Years: 8.00    Pack years: 6.00    Types: Cigarettes    Quit date: 12/01/1990    Years since quitting: 27.8  . Smokeless tobacco: Never Used  . Tobacco comment: quit smoking in the 1990s  Substance and Sexual Activity  . Alcohol use: No    Comment: 03/23/2015 "stopped drinking in 2012 w/gastric bypass; drank socially before bypass"  . Drug use: No  . Sexual activity: Not Currently    Birth control/protection: Surgical

## 2018-09-19 ENCOUNTER — Encounter: Payer: Self-pay | Admitting: Radiology

## 2018-09-19 DIAGNOSIS — E119 Type 2 diabetes mellitus without complications: Secondary | ICD-10-CM | POA: Diagnosis not present

## 2018-09-19 DIAGNOSIS — M797 Fibromyalgia: Secondary | ICD-10-CM | POA: Diagnosis not present

## 2018-09-19 DIAGNOSIS — E063 Autoimmune thyroiditis: Secondary | ICD-10-CM | POA: Diagnosis not present

## 2018-09-19 NOTE — Telephone Encounter (Signed)
Note has been written and has been mailed to patient

## 2018-09-20 ENCOUNTER — Other Ambulatory Visit: Payer: Self-pay | Admitting: Neurology

## 2018-09-20 LAB — NOVEL CORONAVIRUS, NAA: SARS-CoV-2, NAA: NOT DETECTED

## 2018-09-22 ENCOUNTER — Other Ambulatory Visit: Payer: Self-pay

## 2018-09-22 ENCOUNTER — Encounter: Payer: Self-pay | Admitting: Neurology

## 2018-09-22 DIAGNOSIS — M79652 Pain in left thigh: Secondary | ICD-10-CM

## 2018-09-22 DIAGNOSIS — R202 Paresthesia of skin: Secondary | ICD-10-CM

## 2018-09-23 ENCOUNTER — Encounter: Payer: Self-pay | Admitting: Orthopedic Surgery

## 2018-09-23 ENCOUNTER — Telehealth: Payer: Self-pay | Admitting: Physician Assistant

## 2018-09-23 ENCOUNTER — Other Ambulatory Visit: Payer: Self-pay

## 2018-09-23 MED ORDER — METHYLPHENIDATE HCL ER (OSM) 27 MG PO TBCR: 27.0000 mg | EXTENDED_RELEASE_TABLET | Freq: Two times a day (BID) | ORAL | 0 refills | Status: DC

## 2018-09-23 NOTE — Telephone Encounter (Signed)
Last refill 08/02/2018 #60 Has appt 10/06/2018 Pended for approval

## 2018-09-23 NOTE — Telephone Encounter (Signed)
Pt called requested refill on Concerta 27 mg 2/d @ [facility] next appt 9/14

## 2018-09-24 ENCOUNTER — Other Ambulatory Visit (INDEPENDENT_AMBULATORY_CARE_PROVIDER_SITE_OTHER): Payer: Self-pay | Admitting: *Deleted

## 2018-09-24 DIAGNOSIS — R748 Abnormal levels of other serum enzymes: Secondary | ICD-10-CM

## 2018-09-24 NOTE — Progress Notes (Signed)
Hepatic profile to be done prior to the [place] with [facility] 10/14/18.

## 2018-10-01 DIAGNOSIS — R748 Abnormal levels of other serum enzymes: Secondary | ICD-10-CM | POA: Diagnosis not present

## 2018-10-01 DIAGNOSIS — E7849 Other hyperlipidemia: Secondary | ICD-10-CM | POA: Diagnosis not present

## 2018-10-01 DIAGNOSIS — M47816 Spondylosis without myelopathy or radiculopathy, lumbar region: Secondary | ICD-10-CM | POA: Diagnosis not present

## 2018-10-01 DIAGNOSIS — M159 Polyosteoarthritis, unspecified: Secondary | ICD-10-CM | POA: Diagnosis not present

## 2018-10-01 DIAGNOSIS — E119 Type 2 diabetes mellitus without complications: Secondary | ICD-10-CM | POA: Diagnosis not present

## 2018-10-01 DIAGNOSIS — M5136 Other intervertebral disc degeneration, lumbar region: Secondary | ICD-10-CM | POA: Diagnosis not present

## 2018-10-01 DIAGNOSIS — M797 Fibromyalgia: Secondary | ICD-10-CM | POA: Diagnosis not present

## 2018-10-01 DIAGNOSIS — E063 Autoimmune thyroiditis: Secondary | ICD-10-CM | POA: Diagnosis not present

## 2018-10-01 DIAGNOSIS — Z7901 Long term (current) use of anticoagulants: Secondary | ICD-10-CM | POA: Diagnosis not present

## 2018-10-02 LAB — HEPATIC FUNCTION PANEL
AG Ratio: 1.8 (calc) (ref 1.0–2.5)
ALT: 26 U/L (ref 6–29)
AST: 24 U/L (ref 10–35)
Albumin: 4.4 g/dL (ref 3.6–5.1)
Alkaline phosphatase (APISO): 79 U/L (ref 37–153)
Bilirubin, Direct: 0.1 mg/dL (ref 0.0–0.2)
Globulin: 2.5 g/dL (calc) (ref 1.9–3.7)
Indirect Bilirubin: 0.4 mg/dL (calc) (ref 0.2–1.2)
Total Bilirubin: 0.5 mg/dL (ref 0.2–1.2)
Total Protein: 6.9 g/dL (ref 6.1–8.1)

## 2018-10-03 ENCOUNTER — Other Ambulatory Visit (HOSPITAL_COMMUNITY): Payer: PPO

## 2018-10-06 ENCOUNTER — Ambulatory Visit: Payer: PPO | Admitting: Physician Assistant

## 2018-10-07 ENCOUNTER — Ambulatory Visit (HOSPITAL_BASED_OUTPATIENT_CLINIC_OR_DEPARTMENT_OTHER): Admit: 2018-10-07 | Payer: PPO | Admitting: Orthopedic Surgery

## 2018-10-07 ENCOUNTER — Encounter (HOSPITAL_BASED_OUTPATIENT_CLINIC_OR_DEPARTMENT_OTHER): Payer: Self-pay

## 2018-10-07 SURGERY — OSTEOTOMY, WEIL
Anesthesia: Choice | Laterality: Right

## 2018-10-12 NOTE — Progress Notes (Signed)
 Subjective:    Patient ID: Debbie Pineda, female    DOB: 03/27/1960, 58 y.o.   MRN: [id number]  HPI Debbie Pineda is a 58 year old female with a past medical history of ADD, depression, arthritis, DM II, RLE DVT 2014, ? 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 GS/P gstric bypass. She complains of having stomach pain and constipation. LUQ pain started within the past week.  He is taking omeprazole 40 mg twice daily.  She feels like she is constipated. She is passing a normal brown stool daily, occasionally skips a day. No rectal bleeding or melena. Mother and grandmother with history of hiatal hernia. She felt like food would not pass out of the stomach which occurs daily for the past week, occurs mostly after dinner meal. Some nausea at night for the past 2 weeks. No vomiting, can't vomit since gastric bypass. She takes Ibuprofen 200mg four tabs 3 days weekly several months. No weight loss. She derwent a colonoscopy in 2015 which was normal.  History of intermittently elevated LFTS since 2014 followed by Terri Setzer NP. Her most recent LFTs 10/01/2018 showed AST 24.  ALT 26.  Total bilirubin 0.5.  Alk phos 79.  LFTs 09/10/2017: AST 24.  ALT 33.  LFTs 08/26/2017 showed AST 57.  ALT 357.  Total bili 0.5. Ceruloplasmin 37. A1AT 114. ANA negative. 04/2017 Hep C ab negative. Hep B surface antigen negative.  An abdominal sonogram 03/26/2018: 1. No acute findings or explanation for the patient's symptoms. 2. Indeterminate echogenic lesion in the interpolar region of the right kidney without clear correlate on abdominal CT performed 8 months ago. This could reflect a complex cyst. Consider follow-up CT without and with contrast within the next 6 months. 3. The left kidney appears normal.  No hydronephrosis. 4. No biliary dilatation  post cholecystectomy. Liver: No focal lesion identified. Within normal limits in parenchymal echogenicity. Portal vein is patent on color Doppler imaging with normal direction of blood flow towards the liver.  Abdominal/pelvic CT with contrast 07/26/2017: Hepatobiliary: No focal liver abnormality is seen. Status post cholecystectomy. No intrahepatic biliary dilatation. Stable distention of the CBD. Pancreas: Unremarkable. No pancreatic ductal dilatation or surrounding inflammatory changes Spleen: Normal in size without focal abnormality. Stomach/Bowel: Changes of gastric bypass surgery. Stomach remnant is decompressed. Small bowel is nondilated. Normal appendix. Moderate colonic fecal material without dilatation or other focal lesion. No acute findings.  An MRCP 03/06/2017: 1. No acute findings. No explanation for patient's elevated LFTs. 2. Fusiform dilatation of the common bile duct status post cholecystectomy. No choledocholithiasis or mass identified. CBD 11mm.   EGD 04/06/2005:  Mild changes of reflux esophagitis limited to GE junction. Nonerosive antral gastritis and bulbar duodenitis.  Colonoscopy by Dr. Rehman 03/25/2013:  Normal colonoscopy except small external hemorrhoids. Repeat colonoscopy in 10 yrs.   Hepatic Function Latest Ref Rng & Units 10/01/2018 09/10/2017 08/26/2017  Total Protein 6.1 - 8.1 g/dL 6.9 6.3 6.2  Albumin 3.5 - 5.0 g/dL - - -  AST 10 - 35 U/L 24 24 57(H)  ALT 6 - 29 U/L 26 33(H) 357(H)  Alk Phosphatase 38 - 126 U/L - - -  Total Bilirubin 0.2 - 1.2 mg/dL 0.5 0.3 0.5  Bilirubin, Direct 0.0 - 0.2 mg/dL 0.1 0.1 0.1    Past Medical History:  Diagnosis Date  . ADD (attention   deficit disorder)    takes Adderall daily  . Arthritis    "all over"  . Cerebral infarction ([place]) 10/30/2011  . Cerebrovascular disease 08/14/2016  . Chronic back pain    "all over"  . Chronic low back pain 01/11/2016  . Complication of anesthesia    tends to have hypotension  when NPO and post-anesthesia  . Constipation    takes stool softener daily  . Degenerative disk disease   . Degenerative joint disease   . Depression    takes Cymbalta daily for pain per pt  . Diabetes mellitus without complication ([place])   . DVT (deep venous thrombosis) ([facility])    RLE  . Family history of adverse reaction to anesthesia    a family member woke up during surgery; "think it was my mom"  . Fibromyalgia   . Generalized osteoarthritis of multiple sites 11/04/2013  . GERD (gastroesophageal reflux disease)   . Heart palpitations 12/14/2013  . History of blood clots    superficial  . Hypoglycemia   . Hypothyroid    takes Synthroid daily  . Incomplete emptying of bladder   . Insomnia    takes Trazodone nightly  . Iron deficiency anemia    takes Ferrous Sulfate daily  . Joint pain   . Joint swelling    knees and ankles  . Memory disorder 08/14/2016  . Morbid obesity ([place])   . Nausea    takes Zofran as needed.Seeing GI doc  . Neck pain 05/25/2016  . OSA on CPAP    tested more than 5 yrs ago.    . Osteoarthritis   . PFO (patent foramen ovale)   . Primary osteoarthritis of both feet 05/29/2016   Right bunionectomy August 2017 by Dr. Sharol Given  . Scoliosis   . Sleep apnea   . Stroke [facility]) "several"   right foot weakness; memory issues, black spot right visual field since" (03/23/2015)  . Thrombophlebitis   . Trochanteric bursitis of both hips 05/25/2016  . Unilateral primary osteoarthritis, right knee 05/29/2016  . Urinary urgency 04/19/2016  . Vein disorder 11/29/2010   Current Outpatient Medications on File Prior to Visit  Medication Sig Dispense Refill  . docusate sodium (COLACE) 100 MG capsule Take 100 mg by mouth 2 (two) times daily as needed.    Marland Kitchen amoxicillin (AMOXIL) 500 MG capsule Take 500 mg by mouth as needed (Dental procedures).    . baclofen (LIORESAL) 10 MG tablet Take 10 mg by mouth 3 (three) times daily.   1  . Cyanocobalamin (VITAMIN B-12) 5000 MCG SUBL Place  under the tongue daily.    . diclofenac sodium (VOLTAREN) 1 % GEL Apply 4 g topically 4 (four) times daily as needed (PAIN). 5 Tube 1  . donepezil (ARICEPT) 10 MG tablet Take 1 tablet by mouth at bedtime. 90 tablet 0  . DULoxetine (CYMBALTA) 60 MG capsule Take 60 mg by mouth 2 (two) times daily.  11  . Erenumab-aooe (AIMOVIG) 140 MG/ML SOAJ Inject 140 mg into the skin every 30 (thirty) days. 1 pen 6  . fluconazole (DIFLUCAN) 150 MG tablet Take 150 mg by mouth as needed.    . folic acid (FOLVITE) 676 MCG tablet Take 400 mcg by mouth daily.    Marland Kitchen gabapentin (NEURONTIN) 800 MG tablet Take 1 tablet (800 mg total) by mouth 3 (three) times daily.    Marland Kitchen KRILL OIL PO Take 350 mg by mouth.     Marland Kitchen L-Methylfolate-B12-B6-B2 (CEREFOLIN) 06-22-48-5 MG TABS Take 1  tablet by mouth daily. 90 each 3  . levothyroxine (SYNTHROID) 150 MCG tablet Take 150 mcg by mouth daily before breakfast.    . methylphenidate (CONCERTA) 27 MG PO CR tablet Take 1 tablet (27 mg total) by mouth 2 (two) times daily. 60 tablet 0  . mirabegron ER (MYRBETRIQ) 50 MG TB24 tablet Take 50 mg by mouth daily.    . Multiple Minerals-Vitamins (CAL-MAG-ZINC-D PO) Take 3 tablets by mouth daily. Take 2 tablet at 1200 and 1 tablet at 1600  Calcium 1000 mg Vit D 600 iu Magnesium 400 mg Zinc 15 mg    . Multiple Vitamins-Minerals (ONE-A-DAY WOMENS PETITES PO) Take by mouth daily.    . NARCAN 4 MG/0.1ML LIQD nasal spray kit Place 1 spray into the nose as needed (over dose).     . omeprazole (PRILOSEC) 40 MG capsule Take 1 capsule by mouth twice daily. 60 capsule 5  . ondansetron (ZOFRAN) 4 MG tablet Take 4 mg by mouth every 8 (eight) hours as needed for nausea or vomiting.    . rivaroxaban (XARELTO) 20 MG TABS tablet Take 1 tablet (20 mg total) by mouth daily with supper. (Patient taking differently: Take 20 mg by mouth at bedtime. ) 30 tablet 1  . Tapentadol HCl (NUCYNTA) 100 MG TABS Take 50 mg by mouth 3 (three) times daily. 50 mg TID PRN for chronic  back pain, fibromyalgia    . topiramate (TOPAMAX) 100 MG tablet Take 1 tablet (100 mg total) by mouth 2 (two) times daily. 60 tablet 5  . traZODone (DESYREL) 100 MG tablet Take 200 mg by mouth at bedtime as needed for sleep (depends on insomnia).     . vitamin C (ASCORBIC ACID) 500 MG tablet Take 500 mg by mouth daily.    . Vitamin D, Ergocalciferol, (DRISDOL) 50000 UNITS CAPS Take 50,000 Units by mouth every 7 (seven) days. Mondays     No current facility-administered medications on file prior to visit.    Allergies  Allergen Reactions  . Lyrica [Pregabalin] Other (See Comments)    SOB, lower extremity edema and weight gain  . Belsomra [Suvorexant] Other (See Comments)    unknown  . Morphine And Related Itching    Upper torso  . Sulfamethoxazole-Trimethoprim Itching and Rash    Bactrim  . Tape Itching and Rash    Please use "paper" tape Rash if left on longer than 24 hrs    Review of Systems see HPI, all other systems reviewed and are negative    Objective:   Physical Exam  BP 119/80   Pulse 64   Ht 5' 5" (1.651 m)   Wt 212 lb (96.2 kg)   BMI 35.28 kg/m  General: 58-year-old female obese female in no acute distress Eyes: Sclera nonicteric, conjunctiva pink Mouth: No ulcers or lesions Neck: Supple, no lymphadenopathy or thyromegaly Heart: Regular rate and rhythm, no murmurs Lungs: Breath sounds clear throughout Abdomen: Soft, nondistended, mild left upper quadrant and epigastric tenderness without rebound or guarding, bowel sounds to all 4 quadrants, no HSM Extremities: No edema Neuro: Alert and oriented x4, no focal deficits     Assessment & Plan:  1.  58-year-old female with left upper quadrant abdominal pain, + NSAID use, past gastric bypass surgery -Continue pantoprazole 40 mg twice daily, add famotidine 40 mg 1 tab at bedtime -CBC, CMP, CRP and lipase level -Schedule EGD, EGD benefits and risk discussed including risk with sedation, risk of bleeding, perforation  and infection  2.    Constipation -Parallax 1 capful mixed in 8 ounces of water at bedtime -next colonoscopy due 03/2023  3.  History of elevated LFTs with normal AST and ALT 10/01/2018 -CMP ordered -Further recommendations to be provided after laboratory results received and reviewed  4.  Nausea -Ondansetron as needed -Patient will call if nausea symptoms worsen or vomiting develop

## 2018-10-14 ENCOUNTER — Encounter (INDEPENDENT_AMBULATORY_CARE_PROVIDER_SITE_OTHER): Payer: Self-pay | Admitting: *Deleted

## 2018-10-14 ENCOUNTER — Encounter (INDEPENDENT_AMBULATORY_CARE_PROVIDER_SITE_OTHER): Payer: Self-pay | Admitting: Nurse Practitioner

## 2018-10-14 ENCOUNTER — Ambulatory Visit (INDEPENDENT_AMBULATORY_CARE_PROVIDER_SITE_OTHER): Payer: PPO | Admitting: Nurse Practitioner

## 2018-10-14 ENCOUNTER — Other Ambulatory Visit: Payer: Self-pay

## 2018-10-14 VITALS — BP 119/80 | HR 64 | Temp 97.7°F | Ht 65.0 in | Wt 212.0 lb

## 2018-10-14 DIAGNOSIS — R1013 Epigastric pain: Secondary | ICD-10-CM | POA: Insufficient documentation

## 2018-10-14 DIAGNOSIS — R1012 Left upper quadrant pain: Secondary | ICD-10-CM | POA: Diagnosis not present

## 2018-10-14 MED ORDER — FAMOTIDINE 40 MG PO TABS: 40.0000 mg | ORAL_TABLET | Freq: Every day | ORAL | 1 refills | Status: DC

## 2018-10-14 NOTE — Patient Instructions (Signed)
1. Schedule EGD  2. Complete the lab order   3. Add Famotidine 40mg  one tab before dinner   4. Further follow up to be determined after EGD completed

## 2018-10-15 ENCOUNTER — Encounter (INDEPENDENT_AMBULATORY_CARE_PROVIDER_SITE_OTHER): Payer: Self-pay | Admitting: Nurse Practitioner

## 2018-10-15 LAB — C-REACTIVE PROTEIN: CRP: 1.5 mg/L (ref <8.0)

## 2018-10-15 LAB — CBC WITH DIFFERENTIAL/PLATELET
Absolute Monocytes: 347 cells/uL (ref 200–950)
Basophils Absolute: 39 cells/uL (ref 0–200)
Basophils Relative: 0.7 %
Eosinophils Absolute: 218 cells/uL (ref 15–500)
Eosinophils Relative: 3.9 %
HCT: 40.6 % (ref 35.0–45.0)
Hemoglobin: 13.4 g/dL (ref 11.7–15.5)
Lymphs Abs: 2397 cells/uL (ref 850–3900)
MCH: 32.8 pg (ref 27.0–33.0)
MCHC: 33 g/dL (ref 32.0–36.0)
MCV: 99.3 fL (ref 80.0–100.0)
MPV: 10.9 fL (ref 7.5–12.5)
Monocytes Relative: 6.2 %
Neutro Abs: 2598 cells/uL (ref 1500–7800)
Neutrophils Relative %: 46.4 %
Platelets: 215 10*3/uL (ref 140–400)
RBC: 4.09 10*6/uL (ref 3.80–5.10)
RDW: 11.5 % (ref 11.0–15.0)
Total Lymphocyte: 42.8 %
WBC: 5.6 10*3/uL (ref 3.8–10.8)

## 2018-10-15 LAB — COMPLETE METABOLIC PANEL WITH GFR
AG Ratio: 1.8 (calc) (ref 1.0–2.5)
ALT: 255 U/L — ABNORMAL HIGH (ref 6–29)
AST: 139 U/L — ABNORMAL HIGH (ref 10–35)
Albumin: 4.2 g/dL (ref 3.6–5.1)
Alkaline phosphatase (APISO): 111 U/L (ref 37–153)
BUN: 15 mg/dL (ref 7–25)
CO2: 25 mmol/L (ref 20–32)
Calcium: 9.7 mg/dL (ref 8.6–10.4)
Chloride: 108 mmol/L (ref 98–110)
Creat: 0.92 mg/dL (ref 0.50–1.05)
GFR, Est African American: 81 mL/min/{1.73_m2} (ref >=60)
GFR, Est Non African American: 70 mL/min/{1.73_m2} (ref >=60)
Globulin: 2.4 g/dL (calc) (ref 1.9–3.7)
Glucose, Bld: 99 mg/dL (ref 65–139)
Potassium: 4.5 mmol/L (ref 3.5–5.3)
Sodium: 141 mmol/L (ref 135–146)
Total Bilirubin: 0.3 mg/dL (ref 0.2–1.2)
Total Protein: 6.6 g/dL (ref 6.1–8.1)

## 2018-10-15 LAB — LIPASE: Lipase: 17 U/L (ref 7–60)

## 2018-10-16 ENCOUNTER — Ambulatory Visit: Payer: PPO | Admitting: Specialist

## 2018-10-17 ENCOUNTER — Other Ambulatory Visit (INDEPENDENT_AMBULATORY_CARE_PROVIDER_SITE_OTHER): Payer: Self-pay | Admitting: Nurse Practitioner

## 2018-10-17 DIAGNOSIS — R748 Abnormal levels of other serum enzymes: Secondary | ICD-10-CM

## 2018-10-17 DIAGNOSIS — R1012 Left upper quadrant pain: Secondary | ICD-10-CM

## 2018-10-20 ENCOUNTER — Telehealth (INDEPENDENT_AMBULATORY_CARE_PROVIDER_SITE_OTHER): Payer: Self-pay | Admitting: Nurse Practitioner

## 2018-10-20 NOTE — Telephone Encounter (Signed)
Please call patient regarding prescription and labs  -  Ph# [phone]

## 2018-10-20 NOTE — Telephone Encounter (Signed)
I called pt, she wanted to let me know she will go to the lab tomorrow to have repeat hepatic panel and the other labs I ordered.

## 2018-10-21 ENCOUNTER — Ambulatory Visit: Payer: PPO | Admitting: Neurology

## 2018-10-21 ENCOUNTER — Other Ambulatory Visit: Payer: Self-pay

## 2018-10-21 DIAGNOSIS — M79652 Pain in left thigh: Secondary | ICD-10-CM

## 2018-10-21 DIAGNOSIS — R202 Paresthesia of skin: Secondary | ICD-10-CM

## 2018-10-21 NOTE — Procedures (Signed)
[facility]  [place], [place]  [place], [place] [address] Tel: [phone] Fax:  [phone] Test Date:  10/21/2018  Patient: Debbie Pineda DOB: [birth year] Physician: Narda Amber, DO  Sex: Female Height: 5\' 4"  Ref Phys: [name], MD  ID#: [id number] Temp: 34.0C Technician:    Patient Complaints: This is a 58 year old female with chronic back pain and left leg pain referred for evaluation of a lumbosacral radiculopathy.  NCV & EMG Findings: Extensive electrodiagnostic testing of the left lower extremity shows:  1. Left sural and superficial peroneal sensory responses within normal limits. 2. Left peroneal and tibial motor responses are within normal limits. 3. Left tibial H reflex study is within normal limits. 4. There is no evidence of active or chronic motor axonal loss changes affecting any of the tested muscles.  Motor unit configuration and recruitment pattern is within normal limits.  Impression: This is a normal study of the left lower extremity.  In particular, there is no evidence of a lumbosacral radiculopathy or sensorimotor polyneuropathy.   ___________________________ Narda Amber, DO    Nerve Conduction Studies Anti Sensory Summary Table   Site NR Peak (ms) Norm Peak (ms) P-T Amp (V) Norm P-T Amp  Left Sup Peroneal Anti Sensory (Ant Lat Mall)  34C  12 cm    1.9 <4.6 17.7 >4  Left Sural Anti Sensory (Lat Mall)  34C  Calf    2.5 <4.6 15.5 >4   Motor Summary Table   Site NR Onset (ms) Norm Onset (ms) O-P Amp (mV) Norm O-P Amp Site1 Site2 Delta-0 (ms) Dist (cm) Vel (m/s) Norm Vel (m/s)  Left Peroneal Motor (Ext Dig Brev)  34C  Ankle    3.4 <6.0 7.0 >2.5 B Fib Ankle 6.4 33.0 52 >40  B Fib    9.8  6.6  Poplt B Fib 1.1 7.0 64 >40  Poplt    10.9  6.3         Left Peroneal TA Motor (Tib Ant)  34C  Fib Head    2.3 <4.5 4.2 >3 Poplit Fib Head 1.3 6.0 46 >40  Poplit    3.6  3.5         Left Tibial Motor (Abd Hall Brev)  34C  Ankle     4.1 <6.0 12.0 >4 Knee Ankle 7.3 37.0 51 >40  Knee    11.4  8.5          H Reflex Studies   NR H-Lat (ms) Lat Norm (ms) L-R H-Lat (ms)  Left Tibial (Gastroc)  34C     34.15 <35    EMG   Side Muscle Ins Act Fibs Psw Fasc Number Recrt Dur Dur. Amp Amp. Poly Poly. Comment  Left AntTibialis Nml Nml Nml Nml Nml Nml Nml Nml Nml Nml Nml Nml N/A  Left RectFemoris Nml Nml Nml Nml Nml Nml Nml Nml Nml Nml Nml Nml N/A  Left AdductorLong Nml Nml Nml Nml Nml Nml Nml Nml Nml Nml Nml Nml N/A  Left Gastroc Nml Nml Nml Nml Nml Nml Nml Nml Nml Nml Nml Nml N/A  Left BicepsFemS Nml Nml Nml Nml Nml Nml Nml Nml Nml Nml Nml Nml N/A      Waveforms:

## 2018-10-22 ENCOUNTER — Other Ambulatory Visit: Payer: Self-pay | Admitting: Adult Health

## 2018-10-22 DIAGNOSIS — R748 Abnormal levels of other serum enzymes: Secondary | ICD-10-CM | POA: Diagnosis not present

## 2018-10-22 DIAGNOSIS — R1012 Left upper quadrant pain: Secondary | ICD-10-CM | POA: Diagnosis not present

## 2018-10-23 ENCOUNTER — Other Ambulatory Visit (INDEPENDENT_AMBULATORY_CARE_PROVIDER_SITE_OTHER): Payer: Self-pay | Admitting: Nurse Practitioner

## 2018-10-23 ENCOUNTER — Telehealth (INDEPENDENT_AMBULATORY_CARE_PROVIDER_SITE_OTHER): Payer: Self-pay | Admitting: Nurse Practitioner

## 2018-10-23 DIAGNOSIS — R945 Abnormal results of liver function studies: Secondary | ICD-10-CM

## 2018-10-23 DIAGNOSIS — R1012 Left upper quadrant pain: Secondary | ICD-10-CM

## 2018-10-23 DIAGNOSIS — R7401 Elevation of levels of liver transaminase levels: Secondary | ICD-10-CM

## 2018-10-23 LAB — HEPATIC FUNCTION PANEL
AG Ratio: 2.2 (calc) (ref 1.0–2.5)
ALT: 986 U/L — ABNORMAL HIGH (ref 6–29)
AST: 1112 U/L — ABNORMAL HIGH (ref 10–35)
Albumin: 4.2 g/dL (ref 3.6–5.1)
Alkaline phosphatase (APISO): 177 U/L — ABNORMAL HIGH (ref 37–153)
Bilirubin, Direct: 0.2 mg/dL (ref 0.0–0.2)
Globulin: 1.9 g/dL (calc) (ref 1.9–3.7)
Indirect Bilirubin: 0.6 mg/dL (calc) (ref 0.2–1.2)
Total Bilirubin: 0.8 mg/dL (ref 0.2–1.2)
Total Protein: 6.1 g/dL (ref 6.1–8.1)

## 2018-10-23 LAB — IRON,TIBC AND FERRITIN PANEL
%SAT: 48 % (calc) — ABNORMAL HIGH (ref 16–45)
Ferritin: 309 ng/mL — ABNORMAL HIGH (ref 16–232)
Iron: 156 ug/dL (ref 45–160)
TIBC: 326 mcg/dL (calc) (ref 250–450)

## 2018-10-23 LAB — IGG: IgG (Immunoglobin G), Serum: 960 mg/dL (ref 600–1640)

## 2018-10-23 NOTE — Telephone Encounter (Signed)
CT A/P sch'd 10/27/18 at 12 (1145), npo 4 hrs, pick up contrast, patient aware

## 2018-10-23 NOTE — Telephone Encounter (Signed)
Ann pls contact patient to schedule an abd/pelvic CT asap. Orders in [place]. thx

## 2018-10-24 NOTE — Telephone Encounter (Signed)
I called pt and I left a msg on he personal voicemail that if she wasn't  feeling any better today then to get the ordered blood tests done today at [facility] instead of Monday 10/5.

## 2018-10-27 ENCOUNTER — Other Ambulatory Visit: Payer: Self-pay

## 2018-10-27 ENCOUNTER — Ambulatory Visit (HOSPITAL_COMMUNITY)
Admission: RE | Admit: 2018-10-27 | Discharge: 2018-10-27 | Disposition: A | Discharge status: Home / Self Care | Payer: PPO | Source: Ambulatory Visit | Attending: Nurse Practitioner | Admitting: Nurse Practitioner

## 2018-10-27 DIAGNOSIS — R7401 Elevation of levels of liver transaminase levels: Secondary | ICD-10-CM | POA: Insufficient documentation

## 2018-10-27 DIAGNOSIS — R1012 Left upper quadrant pain: Secondary | ICD-10-CM | POA: Insufficient documentation

## 2018-10-27 DIAGNOSIS — R945 Abnormal results of liver function studies: Secondary | ICD-10-CM | POA: Insufficient documentation

## 2018-10-27 DIAGNOSIS — R109 Unspecified abdominal pain: Secondary | ICD-10-CM | POA: Diagnosis not present

## 2018-10-27 MED ORDER — IOHEXOL 300 MG/ML  SOLN
100.0000 mL | Freq: Once | INTRAMUSCULAR | Status: AC | PRN
  Administered 2018-10-27: 100 mL via INTRAVENOUS

## 2018-10-27 NOTE — Telephone Encounter (Signed)
Pt called in and stated the pharmacy will not fill her L-Methylfolate-B12-B6-B2 ([place]) 06-22-48-5 MG TABS [Pharmacy Med Name: METAFOLBIC TABLETS]  they are stating they need a order stating Metafolbic Tablets , pharmacy is stating they only have the order for Cerefolin.

## 2018-10-28 ENCOUNTER — Telehealth (INDEPENDENT_AMBULATORY_CARE_PROVIDER_SITE_OTHER): Payer: Self-pay | Admitting: Nurse Practitioner

## 2018-10-28 MED ORDER — METAFOLBIC 6-1-50-5 MG PO TABS: 1.0000 | ORAL_TABLET | Freq: Every day | ORAL | 3 refills | Status: DC

## 2018-10-28 NOTE — Telephone Encounter (Signed)
Tammy, pls contact [facility] lab, an acute hepatitis panel was ordered, pt had labs done 10/5 but I do not see that the acute hepatitis panel was done.  Can you pls check to make sure acute hepatitis panel gets done?  thx

## 2018-10-28 NOTE — Addendum Note (Signed)
Addended by: Brandon Melnick on: 10/28/2018 09:35 AM   Modules accepted: Orders

## 2018-10-29 ENCOUNTER — Telehealth: Payer: Self-pay | Admitting: Physician Assistant

## 2018-10-29 LAB — HEPATIC FUNCTION PANEL
AG Ratio: 1.9 (calc) (ref 1.0–2.5)
ALT: 170 U/L — ABNORMAL HIGH (ref 6–29)
AST: 32 U/L (ref 10–35)
Albumin: 4.3 g/dL (ref 3.6–5.1)
Alkaline phosphatase (APISO): 127 U/L (ref 37–153)
Bilirubin, Direct: 0.1 mg/dL (ref 0.0–0.2)
Globulin: 2.3 g/dL (calc) (ref 1.9–3.7)
Indirect Bilirubin: 0.2 mg/dL (calc) (ref 0.2–1.2)
Total Bilirubin: 0.3 mg/dL (ref 0.2–1.2)
Total Protein: 6.6 g/dL (ref 6.1–8.1)

## 2018-10-29 LAB — HEPATITIS PANEL, ACUTE
Hep A IgM: NONREACTIVE
Hep B C IgM: NONREACTIVE
Hepatitis B Surface Ag: NONREACTIVE
Hepatitis C Ab: NONREACTIVE
SIGNAL TO CUT-OFF: 0.01 (ref <1.00)

## 2018-10-29 LAB — MITOCHONDRIAL ANTIBODIES: Mitochondrial M2 Ab, IgG: <=20 U

## 2018-10-29 LAB — PROTIME-INR
INR: 1.1
Prothrombin Time: 11.4 s (ref 9.0–11.5)

## 2018-10-29 LAB — EPSTEIN-BARR VIRUS VCA, IGM: EBV VCA IgM: <36 U/mL

## 2018-10-29 LAB — GAMMA GT: GGT: 111 U/L — ABNORMAL HIGH (ref 3–70)

## 2018-10-29 LAB — ANTI-SMOOTH MUSCLE ANTIBODY, IGG: Actin (Smooth Muscle) Antibody (IGG): <20 U (ref <20)

## 2018-10-29 LAB — CMV IGM: CMV IgM: <30 AU/mL

## 2018-10-29 NOTE — Telephone Encounter (Signed)
Pt requesting a refill on the Concerta. Appt is scheduled for 10/29 a RS from 10/9 due to provider being out. Fill at the [facility], [place].

## 2018-10-30 ENCOUNTER — Other Ambulatory Visit: Payer: Self-pay

## 2018-10-30 MED ORDER — METHYLPHENIDATE HCL ER (OSM) 27 MG PO TBCR: 27.0000 mg | EXTENDED_RELEASE_TABLET | Freq: Two times a day (BID) | ORAL | 0 refills | Status: DC

## 2018-10-30 NOTE — Telephone Encounter (Signed)
Last refill 09/23/2018 Pended for approval

## 2018-10-31 ENCOUNTER — Ambulatory Visit: Payer: PPO | Admitting: Physician Assistant

## 2018-11-02 NOTE — Progress Notes (Deleted)
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  She is currently taking Topamax 150 mg daily, Ajovy monthly injection.  She is currently taking Aricept for memory disturbance.  She did have a neuropsychological evaluation that did not show clear evidence of Alzheimer's disease. She reports her memory has been stable, continues to have some difficulty with short-term memory. She does report that her migraines have increased over the last month.  She identifies sunlight as a trigger, has been working outside over the last month.  She recently lost her brother, has been staying with her mother.  She reports she will have about 3 headaches per week.  She describes her headaches as occurring on the left side.  When she gets a headache she will go inside, lie down in a dark room and headache will improve.  She has to take Excedrin Migraine about once a week. 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  She has not had any recent seizure events.  He did recently have a sleep evaluation with Dr. Westley Hummer.  No organic sleep disorder was identified, she decided to continue using her CPAP given her history of stroke.   01/08/18 Dr. Jannifer Franklin: Debbie Pineda is a 58 year old right-handed white female with a history of fibromyalgia. 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 The patient apparently has been cleared to operate a motor vehicle. The patient has been felt to have seizure events, she has been on Topamax which was recently tapered off, but she remains on gabapentin. The  patient has not had any further seizure type events. The patient is on Aricept for her memory, she has had neuropsychological testing that did not show clear evidence of an Alzheimer's type process. She has come off the Topamax completely but has not noted any improvement in cognitive functioning. She has a spinal stimulator in place, she has not gotten much benefit from this, she turned off the stimulator and has had no change in her pain levels. Recent EMG and nerve conduction study evaluation was unremarkable. The patient has pain down the left leg. The patient has recently noted an increase in her headache frequency, she currently is having about 3 a week. This change occurred about 3 weeks ago. The patient believe that this may correlate when she came off of the Topamax. She has had some weight gain as she has come down off of the Topamax as well. She returns to this office for an evaluation.  REVIEW OF SYSTEMS: Out of a complete 14 system review of symptoms, the patient complains only of the following symptoms, and all other reviewed systems are negative.  ALLERGIES: Allergies  Allergen Reactions  . Lyrica [Pregabalin] Other (See Comments)    SOB, lower extremity edema and weight gain  . Belsomra [Suvorexant] Other (See Comments)    unknown  . Morphine And Related Itching    Upper torso  . Sulfamethoxazole-Trimethoprim Itching and Rash    Bactrim  . Tape Itching and Rash    Please use "paper" tape Rash if left on longer than 24 hrs    HOME MEDICATIONS: Outpatient Medications Prior to  Visit  Medication Sig Dispense Refill  . amoxicillin (AMOXIL) 500 MG capsule Take 500 mg by mouth as needed (Dental procedures).    . baclofen (LIORESAL) 10 MG tablet Take 10 mg by mouth 3 (three) times daily.   1  . Cyanocobalamin (VITAMIN B-12) 5000 MCG SUBL Place under the tongue daily.    . diclofenac sodium (VOLTAREN) 1 % GEL Apply 4 g topically 4 (four) times daily as needed (PAIN). 5 Tube  1  . docusate sodium (COLACE) 100 MG capsule Take 100 mg by mouth 2 (two) times daily as needed.    . donepezil (ARICEPT) 10 MG tablet Take 1 tablet by mouth at bedtime. 90 tablet 0  . DULoxetine (CYMBALTA) 60 MG capsule Take 60 mg by mouth 2 (two) times daily.  11  . Erenumab-aooe (AIMOVIG) 140 MG/ML SOAJ Inject 140 mg into the skin every 30 (thirty) days. 1 pen 6  . famotidine (PEPCID) 40 MG tablet Take 1 tablet (40 mg total) by mouth daily. 30 tablet 1  . fluconazole (DIFLUCAN) 150 MG tablet Take 150 mg by mouth as needed.    . folic acid (FOLVITE) 920 MCG tablet Take 400 mcg by mouth daily.    Marland Kitchen gabapentin (NEURONTIN) 800 MG tablet Take 1 tablet (800 mg total) by mouth 3 (three) times daily.    Marland Kitchen KRILL OIL PO Take 350 mg by mouth.     Marland Kitchen L-Methylfolate-B12-B6-B2 (METAFOLBIC) 06-22-48-5 MG TABS Take 1 tablet by mouth daily. 90 tablet 3  . levothyroxine (SYNTHROID) 150 MCG tablet Take 150 mcg by mouth daily before breakfast.    . methylphenidate (CONCERTA) 27 MG PO CR tablet Take 1 tablet (27 mg total) by mouth 2 (two) times daily. 60 tablet 0  . mirabegron ER (MYRBETRIQ) 50 MG TB24 tablet Take 50 mg by mouth daily.    . Multiple Minerals-Vitamins (CAL-MAG-ZINC-D PO) Take 3 tablets by mouth daily. Take 2 tablet at 1200 and 1 tablet at 1600  Calcium 1000 mg Vit D 600 iu Magnesium 400 mg Zinc 15 mg    . Multiple Vitamins-Minerals (ONE-A-DAY WOMENS PETITES PO) Take by mouth daily.    Marland Kitchen NARCAN 4 MG/0.1ML LIQD nasal spray kit Place 1 spray into the nose as needed (over dose).     Marland Kitchen omeprazole (PRILOSEC) 40 MG capsule Take 1 capsule by mouth twice daily. 60 capsule 5  . ondansetron (ZOFRAN) 4 MG tablet Take 4 mg by mouth every 8 (eight) hours as needed for nausea or vomiting.    . rivaroxaban (XARELTO) 20 MG TABS tablet Take 1 tablet (20 mg total) by mouth daily with supper. (Patient taking differently: Take 20 mg by mouth at bedtime. ) 30 tablet 1  . Tapentadol HCl (NUCYNTA) 100 MG TABS Take 50 mg  by mouth 3 (three) times daily. 50 mg TID PRN for chronic back pain, fibromyalgia    . topiramate (TOPAMAX) 100 MG tablet Take 1 tablet (100 mg total) by mouth 2 (two) times daily. 60 tablet 5  . traZODone (DESYREL) 100 MG tablet Take 200 mg by mouth at bedtime as needed for sleep (depends on insomnia).     . vitamin C (ASCORBIC ACID) 500 MG tablet Take 500 mg by mouth daily.    . Vitamin D, Ergocalciferol, (DRISDOL) 50000 UNITS CAPS Take 50,000 Units by mouth every 7 (seven) days. Mondays     No facility-administered medications prior to visit.     PAST MEDICAL HISTORY: Past Medical History:  Diagnosis Date  .  ADD (attention deficit disorder)    takes Adderall daily  . Arthritis    "all over"  . Cerebral infarction ([place]) 10/30/2011  . Cerebrovascular disease 08/14/2016  . Chronic back pain    "all over"  . Chronic low back pain 01/11/2016  . Complication of anesthesia    tends to have hypotension when NPO and post-anesthesia  . Constipation    takes stool softener daily  . Degenerative disk disease   . Degenerative joint disease   . Depression    takes Cymbalta daily for pain per pt  . Diabetes mellitus without complication ([place])   . DVT (deep venous thrombosis) ([facility])    RLE  . Family history of adverse reaction to anesthesia    a family member woke up during surgery; "think it was my mom"  . Fibromyalgia   . Generalized osteoarthritis of multiple sites 11/04/2013  . GERD (gastroesophageal reflux disease)   . Heart palpitations 12/14/2013  . History of blood clots    superficial  . Hypoglycemia   . Hypothyroid    takes Synthroid daily  . Incomplete emptying of bladder   . Insomnia    takes Trazodone nightly  . Iron deficiency anemia    takes Ferrous Sulfate daily  . Joint pain   . Joint swelling    knees and ankles  . Memory disorder 08/14/2016  . Morbid obesity ([place])   . Nausea    takes Zofran as needed.Seeing GI doc  . Neck pain 05/25/2016  . OSA on CPAP     tested more than 5 yrs ago.    . Osteoarthritis   . PFO (patent foramen ovale)   . Primary osteoarthritis of both feet 05/29/2016   Right bunionectomy August 2017 by Dr. Sharol Given  . Scoliosis   . Sleep apnea   . Stroke [facility]) "several"   right foot weakness; memory issues, black spot right visual field since" (03/23/2015)  . Thrombophlebitis   . Trochanteric bursitis of both hips 05/25/2016  . Unilateral primary osteoarthritis, right knee 05/29/2016  . Urinary urgency 04/19/2016  . Vein disorder 11/29/2010    PAST SURGICAL HISTORY: Past Surgical History:  Procedure Laterality Date  . BONE EXCISION Right 08/29/2017   Procedure: right trapezium excision;  Surgeon: Daryll Brod, MD;  Location: [place];  Service: Orthopedics;  Laterality: Right;  . BUNIONECTOMY Right 08/2015  . CARDIAC CATHETERIZATION     2008.  "it was fine" (not sure why she had it done, and doesn't know where)  . CARPOMETACARPEL SUSPENSION PLASTY Right 08/29/2017   Procedure: SUSPENSION PLASTY RIGHT THUMB;  Surgeon: Daryll Brod, MD;  Location: [place];  Service: Orthopedics;  Laterality: Right;  . COLONOSCOPY N/A 03/25/2013   Procedure: COLONOSCOPY;  Surgeon: Rogene Houston, MD;  [facility];  Service: Endoscopy;  Laterality: N/A;  930  . ESOPHAGOGASTRODUODENOSCOPY    . EXPLORATORY LAPAROTOMY     "took fallopian tubes out"  . JOINT REPLACEMENT     bil knee   . KNEE ARTHROPLASTY    . KNEE ARTHROSCOPY Left   . KNEE ARTHROSCOPY W/ ACL RECONSTRUCTION Right    "added pins"  . LAPAROSCOPIC CHOLECYSTECTOMY  ~ 2001  . 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 SPINAL CORD STIMULATOR INSERTION N/A 04/18/2017   Procedure: LUMBAR SPINAL CORD STIMULATOR INSERTION;  Surgeon: Clydell Hakim, MD;  Location: [place];  Service: Neurosurgery;  Laterality: N/A;  LUMBAR SPINAL CORD STIMULATOR INSERTION  . TENDON TRANSFER Right  08/29/2017   Procedure: right abductor pollicis longus transfer;  Surgeon: Daryll Brod, MD;  Location: [place];  Service: Orthopedics;  Laterality: Right;  . TOTAL KNEE ARTHROPLASTY Left 03/23/2015   Procedure: TOTAL KNEE ARTHROPLASTY;  Surgeon: Newt Minion, MD;  Location: [place];  Service: Orthopedics;  Laterality: Left;  . TOTAL KNEE ARTHROPLASTY Right 08/15/2016   Procedure: RIGHT TOTAL KNEE ARTHROPLASTY, REMOVAL ACL SCREWS;  Surgeon: Newt Minion, MD;  Location: [place];  Service: Orthopedics;  Laterality: Right;  . TOTAL KNEE ARTHROPLASTY WITH HARDWARE REMOVAL Right   . VAGINAL HYSTERECTOMY    . VARICOSE VEIN SURGERY Right X 2    FAMILY HISTORY: Family History  Problem Relation Age of Onset  . Heart disease Father   . Cancer Father   . Parkinson's disease Father   . Cancer Mother        skin cancer   . Myasthenia gravis Mother   . Heart disease Brother   . Cancer Brother   . Diabetes Brother   . Stroke Brother   . Heart disease Sister   . Heart attack Sister   . Cancer Maternal Grandfather   . Hypothyroidism Daughter   . Hypertension Other   . Colon cancer Neg Hx     SOCIAL HISTORY: Social History   Socioeconomic History  . Marital status: Married    Spouse name: Jeneen Rinks  . Number of children: 1  . Years of education: 60  . Highest education level: Not on file  Occupational History  . Occupation: Disability  . Occupation: formerly Therapist, sports, [employer]  Social Needs  . Financial resource strain: Not on file  . Food insecurity    Worry: Not on file    Inability: Not on file  . Transportation needs    Medical: Not on file    Non-medical: Not on file  Tobacco Use  . Smoking status: Former Smoker    Packs/day: 0.75    Years: 8.00    Pack years: 6.00    Types: Cigarettes    Quit date: 12/01/1990    Years since quitting: 27.9  . Smokeless tobacco: Never Used  . Tobacco comment: quit smoking in the 1990s  Substance and Sexual Activity  . Alcohol use: No    Comment: 03/23/2015 "stopped drinking in 2012 w/gastric bypass; drank socially  before bypass"  . Drug use: No  . Sexual activity: Not Currently    Birth control/protection: Surgical  Lifestyle  . Physical activity    Days per week: Not on file    Minutes per session: Not on file  . Stress: Not on file  Relationships  . Social Herbalist on phone: Not on file    Gets together: Not on file    Attends religious service: Not on file    Active member of club or organization: Not on file    Attends meetings of clubs or organizations: Not on file    Relationship status: Not on file  . Intimate partner violence    Fear of current or ex partner: Not on file    Emotionally abused: Not on file    Physically abused: Not on file    Forced sexual activity: Not on file  Other Topics Concern  . Not on file  Social History Narrative   Lives with husband   Caffeine use: No soda   Mainly water, drinks decaf tea   Right handed      PHYSICAL EXAM  There were no vitals filed for this visit. There is no height or weight on file to calculate BMI.  Generalized: Well developed, in no acute distress   Neurological examination  Mentation: Alert oriented to time, place, history taking. Follows all commands speech and language fluent Cranial nerve II-XII: Pupils were equal round reactive to light. Extraocular movements were full, visual field were full on confrontational test. Facial sensation and strength were normal. Uvula tongue midline. Head turning and shoulder shrug  were normal and symmetric. Motor: The motor testing reveals 5 over 5 strength of all 4 extremities. Good symmetric motor tone is noted throughout.  Sensory: Sensory testing is intact to soft touch on all 4 extremities. No evidence of extinction is noted.  Coordination: Cerebellar testing reveals good finger-nose-finger and heel-to-shin bilaterally.  Gait and station: Gait is normal. Tandem gait is normal. Romberg is negative. No drift is seen.  Reflexes: Deep tendon reflexes are symmetric and  normal bilaterally.   DIAGNOSTIC DATA (LABS, IMAGING, TESTING) - I reviewed patient records, labs, notes, testing and imaging myself where available.  Lab Results  Component Value Date   WBC 5.6 10/14/2018   HGB 13.4 10/14/2018   HCT 40.6 10/14/2018   MCV 99.3 10/14/2018   PLT 215 10/14/2018      Component Value Date/Time   NA 141 10/14/2018 1302   K 4.5 10/14/2018 1302   CL 108 10/14/2018 1302   CO2 25 10/14/2018 1302   GLUCOSE 99 10/14/2018 1302   BUN 15 10/14/2018 1302   CREATININE 0.92 10/14/2018 1302   CALCIUM 9.7 10/14/2018 1302   PROT 6.6 10/27/2018 1324   ALBUMIN 3.5 07/26/2017 0819   AST 32 10/27/2018 1324   ALT 170 (H) 10/27/2018 1324   ALKPHOS 127 (H) 07/26/2017 0819   BILITOT 0.3 10/27/2018 1324   GFRNONAA 70 10/14/2018 1302   GFRAA 81 10/14/2018 1302   Lab Results  Component Value Date   CHOL 151 05/02/2017   HDL 74 05/02/2017   LDLCALC 66 05/02/2017   TRIG 54 05/02/2017   CHOLHDL 2.0 05/02/2017   Lab Results  Component Value Date   HGBA1C 5.0 05/02/2017   Lab Results  Component Value Date   VITAMINB12 1,582 (H) 08/14/2016   Lab Results  Component Value Date   TSH 4.289 05/01/2017      ASSESSMENT AND PLAN 58 y.o. year old female  has a past medical history of ADD (attention deficit disorder), Arthritis, Cerebral infarction ([place]) (10/30/2011), Cerebrovascular disease (08/14/2016), Chronic back pain, Chronic low back pain (88/91/6945), Complication of anesthesia, Constipation, Degenerative disk disease, Degenerative joint disease, Depression, Diabetes mellitus without complication ([place]), DVT (deep venous thrombosis) ([place]), Family history of adverse reaction to anesthesia, Fibromyalgia, Generalized osteoarthritis of multiple sites (11/04/2013), GERD (gastroesophageal reflux disease), Heart palpitations (12/14/2013), History of blood clots, Hypoglycemia, Hypothyroid, Incomplete emptying of bladder, Insomnia, Iron deficiency anemia, Joint pain, Joint  swelling, Memory disorder (08/14/2016), Morbid obesity ([place]), Nausea, Neck pain (05/25/2016), OSA on CPAP, Osteoarthritis, PFO (patent foramen ovale), Primary osteoarthritis of both feet (05/29/2016), Scoliosis, Sleep apnea, Stroke ([facility]) ("several"), Thrombophlebitis, Trochanteric bursitis of both hips (05/25/2016), Unilateral primary osteoarthritis, right knee (05/29/2016), Urinary urgency (04/19/2016), and Vein disorder (11/29/2010). here with ***   I spent 15 minutes with the patient. 50% of this time was spent   Butler Denmark, [place], DNP 11/02/2018, 7:09 PM [facility] [address], [place], [place] [address] [phone]

## 2018-11-03 ENCOUNTER — Other Ambulatory Visit: Payer: Self-pay | Admitting: Neurology

## 2018-11-03 ENCOUNTER — Ambulatory Visit: Payer: Self-pay | Admitting: Neurology

## 2018-11-03 ENCOUNTER — Encounter: Payer: Self-pay | Admitting: Neurology

## 2018-11-03 ENCOUNTER — Telehealth: Payer: Self-pay

## 2018-11-03 DIAGNOSIS — G4737 Central sleep apnea in conditions classified elsewhere: Secondary | ICD-10-CM

## 2018-11-03 NOTE — Telephone Encounter (Signed)
Patient was a no call/no show for their appointment today.   

## 2018-11-04 ENCOUNTER — Encounter: Payer: Self-pay | Admitting: Neurology

## 2018-11-05 NOTE — Telephone Encounter (Signed)
In reviewing the patient's labs , this test resulted  10/27/2018.

## 2018-11-06 ENCOUNTER — Ambulatory Visit: Payer: Self-pay | Admitting: Neurology

## 2018-11-07 ENCOUNTER — Other Ambulatory Visit: Payer: Self-pay

## 2018-11-07 ENCOUNTER — Encounter (INDEPENDENT_AMBULATORY_CARE_PROVIDER_SITE_OTHER): Payer: Self-pay

## 2018-11-07 ENCOUNTER — Ambulatory Visit: Payer: PPO | Admitting: Specialist

## 2018-11-07 ENCOUNTER — Encounter: Payer: Self-pay | Admitting: Specialist

## 2018-11-07 ENCOUNTER — Ambulatory Visit (INDEPENDENT_AMBULATORY_CARE_PROVIDER_SITE_OTHER): Payer: PPO | Admitting: Specialist

## 2018-11-07 VITALS — BP 108/63 | HR 80 | Ht 65.0 in | Wt 212.0 lb

## 2018-11-07 DIAGNOSIS — M4156 Other secondary scoliosis, lumbar region: Secondary | ICD-10-CM

## 2018-11-07 DIAGNOSIS — M4726 Other spondylosis with radiculopathy, lumbar region: Secondary | ICD-10-CM

## 2018-11-07 DIAGNOSIS — Z9689 Presence of other specified functional implants: Secondary | ICD-10-CM | POA: Diagnosis not present

## 2018-11-07 DIAGNOSIS — M4805 Spinal stenosis, thoracolumbar region: Secondary | ICD-10-CM | POA: Diagnosis not present

## 2018-11-07 DIAGNOSIS — M5136 Other intervertebral disc degeneration, lumbar region: Secondary | ICD-10-CM

## 2018-11-07 NOTE — Patient Instructions (Addendum)
Avoid bending, stooping and avoid lifting weights greater than 10 lbs. Avoid prolong standing and walking. Avoid frequent bending and stooping  No lifting greater than 10 lbs. May use ice or moist heat for pain. Weight loss is of benefit. Handicap license is approved. Pain management Will schedule you a follow up with the surgeon that installed the spinal cord stimulator.  The only surgery that may potentially help is a multilevel fusion with XLIFs at L2-3, L3-4 and L4-5 and posterior rods and screws from T10 to S1. 7 level fusion.

## 2018-11-07 NOTE — Addendum Note (Signed)
Addended by: Minda Ditto, Alyse Low N on: 11/07/2018 10:06 AM   Modules accepted: Orders

## 2018-11-07 NOTE — Progress Notes (Signed)
Office Visit Note   Patient: Debbie Pineda           Date of Birth: 29-Oct-1960           MRN: [id number] Visit Date: 11/07/2018              Requested by: Jake Samples, PA-C [address] [place],  [place] [address] PCP: Jake Samples, PA-C   Assessment & Plan: Visit Diagnoses:  1. Other secondary scoliosis, lumbar region   2. Spinal stenosis of thoracolumbar region   3. Other spondylosis with radiculopathy, lumbar region   4. Lumbar degenerative disc disease   5. Status post insertion of spinal cord stimulator     Plan: Avoid bending, stooping and avoid lifting weights greater than 10 lbs. Avoid prolong standing and walking. Avoid frequent bending and stooping  No lifting greater than 10 lbs. May use ice or moist heat for pain. Weight loss is of benefit. Handicap license is approved. Pain management Will schedule you a follow up with the surgeon that installed the spinal cord stimulator.  The only surgery that may potentially help is a multilevel fusion with XLIFs at L2-3, L3-4 and L4-5 and posterior rods and screws from T10 to S1. 7 level fusion.   Follow-Up Instructions: No follow-ups on file.   Orders:  No orders of the defined types were placed in this encounter.  No orders of the defined types were placed in this encounter.     Procedures: No procedures performed   Clinical Data: No additional findings.   Subjective: Chief Complaint  Patient presents with  . Lower Back - Follow-up  . 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 She has had a spinal cord stimulator. She has been having right foot toe deformities and complaining of right foot  Changes with right foot pain and she also complains of right knee external rotation. No bowel or bladder difficulty. The right  knee is painful and she has Difficulty going up stairs. She is able to reach her shoes and socks. She has some ligamentous laxity. She has been to PT and she is not exercising at all, her  Back hurts too much. [place]. April through Summer with her mom and now with her husband.  Has occasional urinary incontinence. Would like to see the surgeon that placed her SCS to see if there is anything to relieve local pain she has associated with the implant.                       Review of Systems  Constitutional: Negative.   HENT: Negative.   Eyes: Negative.   Respiratory: Negative.   Cardiovascular: Negative.   Gastrointestinal: Negative.   Endocrine: Negative.   Genitourinary: Negative.   Musculoskeletal: Negative.   Skin: Negative.   Allergic/Immunologic: Negative.   Neurological: Negative.   Hematological: Negative.   Psychiatric/Behavioral: Negative.      Objective: Vital Signs: BP 108/63 (BP Location: Left Arm, Patient Position: Sitting)   Pulse 80   Ht 5\' 5"  (1.651 m)   Wt 212 lb (96.2 kg)   BMI 35.28 kg/m   Physical Exam Constitutional:      Appearance: She is well-developed.  HENT:     Head:  Normocephalic and atraumatic.  Eyes:     Pupils: Pupils are equal, round, and reactive to light.  Neck:     Musculoskeletal: Normal range of motion and neck supple.  Pulmonary:     Effort: Pulmonary effort is normal.     Breath sounds: Normal breath sounds.  Abdominal:     General: Bowel sounds are normal.     Palpations: Abdomen is soft.  Skin:    General: Skin is warm and dry.  Neurological:     Mental Status: She is alert and oriented to person, place, and time.  Psychiatric:        Behavior: Behavior normal.        Thought Content: Thought content normal.        Judgment: Judgment normal.     Back Exam   Tenderness  The patient is experiencing tenderness in the lumbar.  Range of Motion  Extension:  abnormal  Flexion: abnormal  Lateral bend right: normal  Lateral bend left: normal  Rotation right: normal  Rotation left: normal   Muscle Strength  Right Quadriceps:  5/5  Left Quadriceps:  5/5  Right Hamstrings:  5/5  Left Hamstrings:  5/5   Tests  Straight leg raise right: negative Straight leg raise left: negative  Reflexes  Patellar: 2/4 Achilles: 2/4 Babinski's sign: normal   Other  Toe walk: normal Heel walk: normal Sensation: normal Gait: normal  Erythema: no back redness Scars: absent  Comments:  Some tenderness left paradorsal spine, and at the midline T-L spine.       Specialty Comments:  No specialty comments available.  Imaging: No results found.   PMFS History: Patient Active Problem List   Diagnosis Date Noted  . Abdominal pain, epigastric 10/14/2018  . LUQ pain 10/14/2018  . Attention deficit hyperactivity disorder (ADHD) 01/12/2018  . Insomnia 01/12/2018  . Elevated liver enzymes 09/10/2017  . History of diabetes mellitus 06/06/2017  . Primary osteoarthritis of right hand 06/06/2017  . Transaminasemia   . TIA (transient ischemic attack) 05/01/2017  . Dysphasia 05/01/2017  . Chronic pain syndrome 05/01/2017  . Confusion   . Presence of right artificial knee joint 09/17/2016  . H/O total knee replacement, right 08/15/2016  . Presence of retained hardware   . Memory disorder 08/14/2016  . Cerebrovascular disease 08/14/2016  . Unilateral primary osteoarthritis, right knee 05/29/2016  . Primary osteoarthritis of both feet 05/29/2016  . Trochanteric bursitis of both hips 05/25/2016  . Neck pain 05/25/2016  . Urinary urgency 04/19/2016  . Chronic low back pain 01/11/2016  . Depression 11/29/2015  . 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 Heart palpitations 12/14/2013  . Generalized osteoarthritis of multiple sites 11/04/2013  . Cerebral infarction ([facility]) 10/30/2011  . PFO (patent foramen ovale) 10/30/2011  . Bradycardia  10/30/2011  . Chest pain 10/30/2011  . Hypothyroid   . Thrombophlebitis   . Sleep apnea   . Fibromyalgia   . Status post bariatric surgery 12/07/2010  . Vein disorder 11/29/2010  . S/P total hysterectomy and bilateral salpingo-oophorectomy 11/29/2010  . S/P exploratory laparotomy 11/29/2010  . S/P cholecystectomy 11/29/2010  . S/P ACL surgery 11/29/2010  . Morbid obesity ([facility]) 11/10/2010   Past Medical History:  Diagnosis Date  . ADD (attention deficit disorder)    takes Adderall daily  . Arthritis    "all over"  . Cerebral infarction ([facility]) 10/30/2011  . Cerebrovascular disease 08/14/2016  . Chronic back pain    "all over"  . Chronic low  back pain 01/11/2016  . Complication of anesthesia    tends to have hypotension when NPO and post-anesthesia  . Constipation    takes stool softener daily  . Degenerative disk disease   . Degenerative joint disease   . Depression    takes Cymbalta daily for pain per pt  . Diabetes mellitus without complication ([facility])   . DVT (deep venous thrombosis) ([facility])    RLE  . Family history of adverse reaction to anesthesia    a family member woke up during surgery; "think it was my mom"  . Fibromyalgia   . Generalized osteoarthritis of multiple sites 11/04/2013  . GERD (gastroesophageal reflux disease)   . Heart palpitations 12/14/2013  . History of blood clots    superficial  . Hypoglycemia   . Hypothyroid    takes Synthroid daily  . Incomplete emptying of bladder   . Insomnia    takes Trazodone nightly  . Iron deficiency anemia    takes Ferrous Sulfate daily  . Joint pain   . Joint swelling    knees and ankles  . Memory disorder 08/14/2016  . Morbid obesity ([place])   . Nausea    takes Zofran as needed.Seeing GI doc  . Neck pain 05/25/2016  . OSA on CPAP    tested more than 5 yrs ago.    . Osteoarthritis   . PFO (patent foramen ovale)   . Primary osteoarthritis of both feet 05/29/2016   Right bunionectomy August 2017 by Dr. Sharol Given  .  Scoliosis   . Sleep apnea   . Stroke [facility]) "several"   right foot weakness; memory issues, black spot right visual field since" (03/23/2015)  . Thrombophlebitis   . Trochanteric bursitis of both hips 05/25/2016  . Unilateral primary osteoarthritis, right knee 05/29/2016  . Urinary urgency 04/19/2016  . Vein disorder 11/29/2010    Family History  Problem Relation Age of Onset  . Heart disease Father   . Cancer Father   . Parkinson's disease Father   . Cancer Mother        skin cancer   . Myasthenia gravis Mother   . Heart disease Brother   . Cancer Brother   . Diabetes Brother   . Stroke Brother   . Heart disease Sister   . Heart attack Sister   . Cancer Maternal Grandfather   . Hypothyroidism Daughter   . Hypertension Other   . Colon cancer Neg Hx     Past Surgical History:  Procedure Laterality Date  . BONE EXCISION Right 08/29/2017   Procedure: right trapezium excision;  Surgeon: Daryll Brod, MD;  Location: [facility];  Service: Orthopedics;  Laterality: Right;  . BUNIONECTOMY Right 08/2015  . CARDIAC CATHETERIZATION     2008.  "it was fine" (not sure why she had it done, and doesn't know where)  . CARPOMETACARPEL SUSPENSION PLASTY Right 08/29/2017   Procedure: SUSPENSION PLASTY RIGHT THUMB;  Surgeon: Daryll Brod, MD;  Location: [facility];  Service: Orthopedics;  Laterality: Right;  . COLONOSCOPY N/A 03/25/2013   Procedure: COLONOSCOPY;  Surgeon: Rogene Houston, MD;  [facility];  Service: Endoscopy;  Laterality: N/A;  930  . ESOPHAGOGASTRODUODENOSCOPY    . EXPLORATORY LAPAROTOMY     "took fallopian tubes out"  . JOINT REPLACEMENT     bil knee   . KNEE ARTHROPLASTY    . KNEE ARTHROSCOPY Left   . KNEE ARTHROSCOPY W/ ACL RECONSTRUCTION Right    "  added pins"  . LAPAROSCOPIC CHOLECYSTECTOMY  ~ 2001  . 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 SPINAL CORD STIMULATOR INSERTION N/A 04/18/2017   Procedure: LUMBAR SPINAL CORD STIMULATOR  INSERTION;  Surgeon: Clydell Hakim, MD;  Location: [facility];  Service: Neurosurgery;  Laterality: N/A;  LUMBAR SPINAL CORD STIMULATOR INSERTION  . TENDON TRANSFER Right 08/29/2017   Procedure: right abductor pollicis longus transfer;  Surgeon: Daryll Brod, MD;  Location: [facility];  Service: Orthopedics;  Laterality: Right;  . TOTAL KNEE ARTHROPLASTY Left 03/23/2015   Procedure: TOTAL KNEE ARTHROPLASTY;  Surgeon: Newt Minion, MD;  Location: [facility];  Service: Orthopedics;  Laterality: Left;  . TOTAL KNEE ARTHROPLASTY Right 08/15/2016   Procedure: RIGHT TOTAL KNEE ARTHROPLASTY, REMOVAL ACL SCREWS;  Surgeon: Newt Minion, MD;  Location: [facility];  Service: Orthopedics;  Laterality: Right;  . TOTAL KNEE ARTHROPLASTY WITH HARDWARE REMOVAL Right   . VAGINAL HYSTERECTOMY    . VARICOSE VEIN SURGERY Right X 2   Social History   Occupational History  . Occupation: Disability  . Occupation: formerly Therapist, sports, [employer]  Tobacco Use  . Smoking status: Former Smoker    Packs/day: 0.75    Years: 8.00    Pack years: 6.00    Types: Cigarettes    Quit date: 12/01/1990    Years since quitting: 27.9  . Smokeless tobacco: Never Used  . Tobacco comment: quit smoking in the 1990s  Substance and Sexual Activity  . Alcohol use: No    Comment: 03/23/2015 "stopped drinking in 2012 w/gastric bypass; drank socially before bypass"  . Drug use: No  . Sexual activity: Not Currently    Birth control/protection: Surgical

## 2018-11-10 ENCOUNTER — Telehealth: Payer: Self-pay | Admitting: Orthopedic Surgery

## 2018-11-10 NOTE — Progress Notes (Signed)
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  She has been felt to have seizure events, she remains on Topamax and gabapentin.  She has not had any further seizure type events.  She remains on Aricept for her memory.  She has had neuropsychological testing that did not show clear evidence of Alzheimer's disease.  She does confirm a family history of Alzheimer's disease.  She sees a pain management doctor for chronic back pain and fibromyalgia.  She continues to complain of migraine headaches, near daily in nature. She was initially started on Ajovy, and had good benefit, but the benefit wore off.  Recently she was switched to Aimovig 140 mg monthly injection, with moderate improvement in her headaches.  She describes her typical migraine headache as starting bifrontal, spreading occipitally.  She is able to relieve the headache by lying down.  If the headache is particularly severe she will take Excedrin Migraine.  She is not supposed to take Tylenol due to elevated liver enzymes.  She remains on CPAP.  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  She has had a sleep study in the past, no organic sleep disorder was identified, but she has decided to continue using her CPAP.  She presents today for follow-up unaccompanied.  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  She is currently taking Topamax 150 mg daily, Ajovy monthly injection.  She is currently taking  Aricept for memory disturbance.  She did have a neuropsychological evaluation that did not show clear evidence of Alzheimer's disease. She reports her memory has been stable, continues to have some difficulty with short-term memory. She does report that her migraines have increased over the last month.  She identifies sunlight as a trigger, has been working outside over the last month.  She recently lost her brother, has been staying with her mother.  She reports she will have about 3 headaches per week.  She describes her headaches as occurring on the left side.  When she gets a headache she will go inside, lie down in a dark room and headache will improve.  She has to take Excedrin Migraine about once a week. 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  She has not had any recent seizure events.  He did recently have a sleep evaluation with Dr. Westley Hummer.  No organic sleep disorder was identified, she decided to continue using her CPAP given her history of stroke.  REVIEW OF SYSTEMS: Out of a complete 14 system review of symptoms, the patient complains only of the following symptoms, and all other reviewed systems are negative.  Headaches, memory loss  ALLERGIES: Allergies  Allergen Reactions  . Lyrica [Pregabalin] Shortness Of Breath and Swelling    lower extremity edema and weight gain  . Belsomra [Suvorexant] Other (See Comments)    unknown  . Morphine And Related Itching    Upper torso  . Sulfamethoxazole-Trimethoprim Itching and Rash    Bactrim  . Tape Itching and Rash    Please use "paper" tape Rash  if left on longer than 24 hrs    HOME MEDICATIONS: Outpatient Medications Prior to Visit  Medication Sig Dispense Refill  . amoxicillin (AMOXIL) 500 MG capsule Take 2,000 mg by mouth See admin instructions. Take 2000 mg by mouth 1 hour prior to dental appointment    . baclofen (LIORESAL) 10 MG tablet Take 10 mg by mouth 3 (three) times daily.   1  . Cyanocobalamin  (VITAMIN B-12) 5000 MCG SUBL Place 5,000 mcg under the tongue daily.     . diclofenac sodium (VOLTAREN) 1 % GEL Apply 4 g topically 4 (four) times daily as needed (PAIN). 5 Tube 1  . docusate sodium (COLACE) 100 MG capsule Take 100-200 mg by mouth 2 (two) times daily as needed for moderate constipation.     Marland Kitchen donepezil (ARICEPT) 10 MG tablet Take 1 tablet by mouth at bedtime. (Patient taking differently: Take 10 mg by mouth at bedtime. ) 90 tablet 0  . DULoxetine (CYMBALTA) 60 MG capsule Take 60 mg by mouth 2 (two) times daily.  11  . Erenumab-aooe (AIMOVIG) 140 MG/ML SOAJ Inject 140 mg into the skin every 30 (thirty) days. 1 pen 6  . famotidine (PEPCID) 40 MG tablet Take 1 tablet (40 mg total) by mouth daily. (Patient not taking: Reported on 11/06/2018) 30 tablet 1  . fluconazole (DIFLUCAN) 150 MG tablet Take 150 mg by mouth daily as needed (yeast infection).     . folic acid (FOLVITE) 163 MCG tablet Take 400 mcg by mouth daily.    Marland Kitchen gabapentin (NEURONTIN) 800 MG tablet Take 1 tablet (800 mg total) by mouth 3 (three) times daily.    Javier Docker Oil 350 MG CAPS Take 350 mg by mouth at bedtime.     Marland Kitchen L-Methylfolate-B12-B6-B2 (METAFOLBIC) 06-22-48-5 MG TABS Take 1 tablet by mouth daily. (Patient taking differently: Take 1 tablet by mouth at bedtime. ) 90 tablet 3  . levothyroxine (SYNTHROID) 150 MCG tablet Take 150 mcg by mouth daily before breakfast.    . methylphenidate (CONCERTA) 27 MG PO CR tablet Take 1 tablet (27 mg total) by mouth 2 (two) times daily. 60 tablet 0  . mirabegron ER (MYRBETRIQ) 50 MG TB24 tablet Take 50 mg by mouth daily.    . Multiple Minerals-Vitamins (CAL-MAG-ZINC-D PO) Take 3 tablets by mouth daily.     . Multiple Vitamins-Minerals (ONE-A-DAY WOMENS PETITES PO) Take 1 tablet by mouth 2 (two) times daily.     Marland Kitchen NARCAN 4 MG/0.1ML LIQD nasal spray kit Place 0.4 mg into the nose once.     Marland Kitchen omeprazole (PRILOSEC) 40 MG capsule Take 1 capsule by mouth twice daily. (Patient taking  differently: Take 40 mg by mouth 2 (two) times daily at 8 am and 10 pm. ) 60 capsule 5  . ondansetron (ZOFRAN-ODT) 4 MG disintegrating tablet Take 4 mg by mouth every 8 (eight) hours as needed for nausea or vomiting.    . rivaroxaban (XARELTO) 20 MG TABS tablet Take 1 tablet (20 mg total) by mouth daily with supper. (Patient taking differently: Take 20 mg by mouth at bedtime. ) 30 tablet 1  . Tapentadol HCl (NUCYNTA) 100 MG TABS Take 50 mg by mouth 3 (three) times daily as needed (pain).     . topiramate (TOPAMAX) 100 MG tablet Take 1 tablet (100 mg total) by mouth 2 (two) times daily. 60 tablet 5  . traZODone (DESYREL) 100 MG tablet Take 200 mg by mouth at bedtime.     . vitamin C (  ASCORBIC ACID) 500 MG tablet Take 500 mg by mouth daily.    . Vitamin D, Ergocalciferol, (DRISDOL) 50000 UNITS CAPS Take 50,000 Units by mouth every 7 (seven) days.      No facility-administered medications prior to visit.     PAST MEDICAL HISTORY: Past Medical History:  Diagnosis Date  . ADD (attention deficit disorder)    takes Adderall daily  . Arthritis    "all over"  . Cerebral infarction ([place]) 10/30/2011  . Cerebrovascular disease 08/14/2016  . Chronic back pain    "all over"  . Chronic low back pain 01/11/2016  . Complication of anesthesia    tends to have hypotension when NPO and post-anesthesia  . Constipation    takes stool softener daily  . Degenerative disk disease   . Degenerative joint disease   . Depression    takes Cymbalta daily for pain per pt  . Diabetes mellitus without complication ([place])   . DVT (deep venous thrombosis) ([facility])    RLE  . Family history of adverse reaction to anesthesia    a family member woke up during surgery; "think it was my mom"  . Fibromyalgia   . Generalized osteoarthritis of multiple sites 11/04/2013  . GERD (gastroesophageal reflux disease)   . Heart palpitations 12/14/2013  . History of blood clots    superficial  . Hypoglycemia   . Hypothyroid     takes Synthroid daily  . Incomplete emptying of bladder   . Insomnia    takes Trazodone nightly  . Iron deficiency anemia    takes Ferrous Sulfate daily  . Joint pain   . Joint swelling    knees and ankles  . Memory disorder 08/14/2016  . Morbid obesity ([place])   . Nausea    takes Zofran as needed.Seeing GI doc  . Neck pain 05/25/2016  . OSA on CPAP    tested more than 5 yrs ago.    . Osteoarthritis   . PFO (patent foramen ovale)   . Primary osteoarthritis of both feet 05/29/2016   Right bunionectomy August 2017 by Dr. Sharol Given  . Scoliosis   . Sleep apnea   . Stroke [facility]) "several"   right foot weakness; memory issues, black spot right visual field since" (03/23/2015)  . Thrombophlebitis   . Trochanteric bursitis of both hips 05/25/2016  . Unilateral primary osteoarthritis, right knee 05/29/2016  . Urinary urgency 04/19/2016  . Vein disorder 11/29/2010    PAST SURGICAL HISTORY: Past Surgical History:  Procedure Laterality Date  . BONE EXCISION Right 08/29/2017   Procedure: right trapezium excision;  Surgeon: Daryll Brod, MD;  Location: [place];  Service: Orthopedics;  Laterality: Right;  . BUNIONECTOMY Right 08/2015  . CARDIAC CATHETERIZATION     2008.  "it was fine" (not sure why she had it done, and doesn't know where)  . CARPOMETACARPEL SUSPENSION PLASTY Right 08/29/2017   Procedure: SUSPENSION PLASTY RIGHT THUMB;  Surgeon: Daryll Brod, MD;  Location: [place];  Service: Orthopedics;  Laterality: Right;  . COLONOSCOPY N/A 03/25/2013   Procedure: COLONOSCOPY;  Surgeon: Rogene Houston, MD;  [facility];  Service: Endoscopy;  Laterality: N/A;  930  . ESOPHAGOGASTRODUODENOSCOPY    . EXPLORATORY LAPAROTOMY     "took fallopian tubes out"  . JOINT REPLACEMENT     bil knee   . KNEE ARTHROPLASTY    . KNEE ARTHROSCOPY Left   . KNEE ARTHROSCOPY W/ ACL RECONSTRUCTION Right    "added  pins"  . LAPAROSCOPIC CHOLECYSTECTOMY  ~ 2001  . ROUX-EN-Y GASTRIC  BYPASS  11/20/2010  . SPINAL CORD STIMULATOR INSERTION N/A 04/18/2017   Procedure: LUMBAR SPINAL CORD STIMULATOR INSERTION;  Surgeon: Clydell Hakim, MD;  Location: [place];  Service: Neurosurgery;  Laterality: N/A;  LUMBAR SPINAL CORD STIMULATOR INSERTION  . TENDON TRANSFER Right 08/29/2017   Procedure: right abductor pollicis longus transfer;  Surgeon: Daryll Brod, MD;  Location: [place];  Service: Orthopedics;  Laterality: Right;  . TOTAL KNEE ARTHROPLASTY Left 03/23/2015   Procedure: TOTAL KNEE ARTHROPLASTY;  Surgeon: Newt Minion, MD;  Location: [place];  Service: Orthopedics;  Laterality: Left;  . TOTAL KNEE ARTHROPLASTY Right 08/15/2016   Procedure: RIGHT TOTAL KNEE ARTHROPLASTY, REMOVAL ACL SCREWS;  Surgeon: Newt Minion, MD;  Location: [place];  Service: Orthopedics;  Laterality: Right;  . TOTAL KNEE ARTHROPLASTY WITH HARDWARE REMOVAL Right   . VAGINAL HYSTERECTOMY    . VARICOSE VEIN SURGERY Right X 2    FAMILY HISTORY: Family History  Problem Relation Age of Onset  . Heart disease Father   . Cancer Father   . Parkinson's disease Father   . Cancer Mother        skin cancer   . Myasthenia gravis Mother   . Heart disease Brother   . Cancer Brother   . Diabetes Brother   . Stroke Brother   . Heart disease Sister   . Heart attack Sister   . Cancer Maternal Grandfather   . Hypothyroidism Daughter   . Hypertension Other   . Colon cancer Neg Hx     SOCIAL HISTORY: Social History   Socioeconomic History  . Marital status: Married    Spouse name: Jeneen Rinks  . Number of children: 1  . Years of education: 56  . Highest education level: Not on file  Occupational History  . Occupation: Disability  . Occupation: formerly Therapist, sports, [employer]  Social Needs  . Financial resource strain: Not on file  . Food insecurity    Worry: Not on file    Inability: Not on file  . Transportation needs    Medical: Not on file    Non-medical: Not on file  Tobacco Use  . Smoking status: Former  Smoker    Packs/day: 0.75    Years: 8.00    Pack years: 6.00    Types: Cigarettes    Quit date: 12/01/1990    Years since quitting: 27.9  . Smokeless tobacco: Never Used  . Tobacco comment: quit smoking in the 1990s  Substance and Sexual Activity  . Alcohol use: No    Comment: 03/23/2015 "stopped drinking in 2012 w/gastric bypass; drank socially before bypass"  . Drug use: No  . Sexual activity: Not Currently    Birth control/protection: Surgical  Lifestyle  . Physical activity    Days per week: Not on file    Minutes per session: Not on file  . Stress: Not on file  Relationships  . Social Herbalist on phone: Not on file    Gets together: Not on file    Attends religious service: Not on file    Active member of club or organization: Not on file    Attends meetings of clubs or organizations: Not on file    Relationship status: Not on file  . Intimate partner violence    Fear of current or ex partner: Not on file    Emotionally abused: Not on file  Physically abused: Not on file    Forced sexual activity: Not on file  Other Topics Concern  . Not on file  Social History Narrative   Lives with husband   Caffeine use: No soda   Mainly water, drinks decaf tea   Right handed    PHYSICAL EXAM  Vitals:   11/11/18 0803  BP: 108/68  Pulse: 68  Temp: (!) 97.5 F (36.4 C)  Weight: 213 lb (96.6 kg)  Height: 5' 4.5" (1.638 m)   Body mass index is 36 kg/m.  Generalized: Well developed, in no acute distress  MMSE - Mini Mental State Exam 11/11/2018 07/11/2017 01/10/2017  Orientation to time _0 Orientation to Place _1 Registration _2 Attention/ Calculation _3 Recall _4 Language- name 2 objects _5 Language- repeat _6 Language- follow 3 step command _7 Language- read & follow direction _8 Write a sentence _9 Copy design _10 Total score _11 Neurological examination  Mentation: Alert oriented to time,  place, history taking. Follows all commands speech and language fluent Cranial nerve II-XII: Pupils were equal round reactive to light. Extraocular movements were full, visual field were full on confrontational test. Facial sensation and strength were normal. Head turning and shoulder shrug  were normal and symmetric. Motor: The motor testing reveals 5 over 5 strength of all 4 extremities. Good symmetric motor tone is noted throughout.  Sensory: Sensory testing is intact to soft touch on all 4 extremities. No evidence of extinction is noted.  Coordination: Cerebellar testing reveals good finger-nose-finger and heel-to-shin bilaterally.  Gait and station: Gait is normal. Tandem gait is normal. Romberg is negative. No drift is seen.  Reflexes: Deep tendon reflexes are symmetric and normal bilaterally.   DIAGNOSTIC DATA (LABS, IMAGING, TESTING) - I reviewed patient records, labs, notes, testing and imaging myself where available.  Lab Results  Component Value Date   WBC 5.6 10/14/2018   HGB 13.4 10/14/2018   HCT 40.6 10/14/2018   MCV 99.3 10/14/2018   PLT 215 10/14/2018      Component Value Date/Time   NA 141 10/14/2018 1302   K 4.5 10/14/2018 1302   CL 108 10/14/2018 1302   CO2 25 10/14/2018 1302   GLUCOSE 99 10/14/2018 1302   BUN 15 10/14/2018 1302   CREATININE 0.92 10/14/2018 1302   CALCIUM 9.7 10/14/2018 1302   PROT 6.6 10/27/2018 1324   ALBUMIN 3.5 07/26/2017 0819   AST 32 10/27/2018 1324   ALT 170 (H) 10/27/2018 1324   ALKPHOS 127 (H) 07/26/2017 0819   BILITOT 0.3 10/27/2018 1324   GFRNONAA 70 10/14/2018 1302   GFRAA 81 10/14/2018 1302   Lab Results  Component Value Date   CHOL 151 05/02/2017   HDL 74 05/02/2017   LDLCALC 66 05/02/2017   TRIG 54 05/02/2017   CHOLHDL 2.0 05/02/2017   Lab Results  Component Value Date   HGBA1C 5.0 05/02/2017   Lab Results  Component Value Date   VITAMINB12 1,582 (H) 08/14/2016   Lab Results  Component Value Date   TSH 4.289  05/01/2017      ASSESSMENT AND PLAN 58 y.o. year old female  has a past medical history of ADD (attention deficit disorder), Arthritis, Cerebral infarction ([place]) (10/30/2011), Cerebrovascular disease (08/14/2016), Chronic back pain, Chronic low back pain (01/11/2016),  Complication of anesthesia, Constipation, Degenerative disk disease, Degenerative joint disease, Depression, Diabetes mellitus without complication ([place]), DVT (deep venous thrombosis) ([place]), Family history of adverse reaction to anesthesia, Fibromyalgia, Generalized osteoarthritis of multiple sites (11/04/2013), GERD (gastroesophageal reflux disease), Heart palpitations (12/14/2013), History of blood clots, Hypoglycemia, Hypothyroid, Incomplete emptying of bladder, Insomnia, Iron deficiency anemia, Joint pain, Joint swelling, Memory disorder (08/14/2016), Morbid obesity ([place]), Nausea, Neck pain (05/25/2016), OSA on CPAP, Osteoarthritis, PFO (patent foramen ovale), Primary osteoarthritis of both feet (05/29/2016), Scoliosis, Sleep apnea, Stroke ([facility]) ("several"), Thrombophlebitis, Trochanteric bursitis of both hips (05/25/2016), Unilateral primary osteoarthritis, right knee (05/29/2016), Urinary urgency (04/19/2016), and Vein disorder (11/29/2010). here with:  1.  Seizures -She has history of seizure type events, denies any recent events -Remains on Topamax, but has been tapered off in the past, continuing on gabapentin  2.  Migraine headaches -Continue Aimovig 140 mg monthly injection, has tried Ajovy in the past, she lost benefit prompting the switch, she may want to return to Ajovy if she wishes (at one point on Ajovy she had 1 headache per month). -She complains of near daily headache, we may consider Botox in the future, she is on Xarelto, which is not a contraindication -Before making any changes, she would like to get an eye exam, update her glasses to see if that improves her headaches at all -Continue Topamax 100 mg twice a day, she is also  on baclofen and gabapentin which may be helpful for headaches  3.  History of stroke -She remains on Xarelto  4.  Memory disturbance -Her memory score is stable 28/30 -Formal neuropsychological evaluation has not shown underlying Alzheimer's disease process, but she reports strong family history -Continue Aricept 10 mg at bedtime  5.  Fibromyalgia -She remains on Cymbalta from pain management -Continue gabapentin 800 mg 3 times a day  6. MTHFR enzyme mutation -She was taking Cerefolin, switched to L-methylfolate B12-B6-B2 (Metafolbic) pharmacy preference   She will follow-up in 6 months or sooner if needed.  I did advise if her symptoms worsen or she develops any new symptoms she should let us know.    I spent 25 minutes with the patient. 50% of this time was spent discussing her plan of care.   Butler Denmark, AGNP-C, DNP 11/11/2018, 8:11 AM [facility] [address] [place], [place] [address] [phone]

## 2018-11-10 NOTE — Telephone Encounter (Signed)
Patient called needing date of Bunionectomy. I looked in [facility]. [facility] 09/13/2015.

## 2018-11-11 ENCOUNTER — Encounter: Payer: Self-pay | Admitting: Neurology

## 2018-11-11 ENCOUNTER — Other Ambulatory Visit: Payer: Self-pay

## 2018-11-11 ENCOUNTER — Ambulatory Visit: Payer: PPO | Admitting: Neurology

## 2018-11-11 VITALS — BP 108/68 | HR 68 | Temp 97.5°F | Ht 64.5 in | Wt 213.0 lb

## 2018-11-11 DIAGNOSIS — M21072 Valgus deformity, not elsewhere classified, left ankle: Secondary | ICD-10-CM | POA: Diagnosis not present

## 2018-11-11 DIAGNOSIS — M21071 Valgus deformity, not elsewhere classified, right ankle: Secondary | ICD-10-CM | POA: Diagnosis not present

## 2018-11-11 DIAGNOSIS — G43909 Migraine, unspecified, not intractable, without status migrainosus: Secondary | ICD-10-CM

## 2018-11-11 DIAGNOSIS — R413 Other amnesia: Secondary | ICD-10-CM | POA: Diagnosis not present

## 2018-11-11 DIAGNOSIS — I679 Cerebrovascular disease, unspecified: Secondary | ICD-10-CM | POA: Diagnosis not present

## 2018-11-11 MED ORDER — DONEPEZIL HCL 10 MG PO TABS: 10.0000 mg | ORAL_TABLET | Freq: Every day | ORAL | 1 refills | Status: DC

## 2018-11-11 MED ORDER — AIMOVIG 140 MG/ML ~~LOC~~ SOAJ: 140.0000 mg | SUBCUTANEOUS | 6 refills | Status: DC

## 2018-11-11 MED ORDER — TOPIRAMATE 100 MG PO TABS: 100.0000 mg | ORAL_TABLET | Freq: Two times a day (BID) | ORAL | 5 refills | Status: DC

## 2018-11-11 NOTE — Progress Notes (Signed)
I have read the note, and I agree with the clinical assessment and plan.  Debbie Pineda Debbie Pineda   

## 2018-11-11 NOTE — Patient Instructions (Signed)
1. Please get an eye exam 2. If your headaches do not improve after new glasses please let us know 3. Continue current medications

## 2018-11-12 ENCOUNTER — Other Ambulatory Visit: Payer: Self-pay

## 2018-11-12 ENCOUNTER — Encounter (HOSPITAL_COMMUNITY)
Admission: RE | Admit: 2018-11-12 | Discharge: 2018-11-12 | Disposition: A | Discharge status: Home / Self Care | Payer: PPO | Source: Ambulatory Visit | Attending: Internal Medicine | Admitting: Internal Medicine

## 2018-11-12 ENCOUNTER — Other Ambulatory Visit (HOSPITAL_COMMUNITY)
Admission: RE | Admit: 2018-11-12 | Discharge: 2018-11-12 | Disposition: A | Discharge status: Home / Self Care | Payer: PPO | Source: Ambulatory Visit | Attending: Internal Medicine | Admitting: Internal Medicine

## 2018-11-12 DIAGNOSIS — Z01812 Encounter for preprocedural laboratory examination: Secondary | ICD-10-CM | POA: Insufficient documentation

## 2018-11-12 DIAGNOSIS — Z20828 Contact with and (suspected) exposure to other viral communicable diseases: Secondary | ICD-10-CM | POA: Insufficient documentation

## 2018-11-12 LAB — SARS CORONAVIRUS 2 (TAT 6-24 HRS): SARS Coronavirus 2: NEGATIVE

## 2018-11-14 ENCOUNTER — Ambulatory Visit (HOSPITAL_COMMUNITY)
Admission: RE | Admit: 2018-11-14 | Discharge: 2018-11-14 | Disposition: A | Discharge status: Home / Self Care | Payer: PPO | Attending: Internal Medicine | Admitting: Internal Medicine

## 2018-11-14 ENCOUNTER — Ambulatory Visit (HOSPITAL_COMMUNITY): Payer: PPO | Admitting: Anesthesiology

## 2018-11-14 ENCOUNTER — Encounter (HOSPITAL_COMMUNITY)
Admission: RE | Disposition: A | Discharge status: Home / Self Care | Payer: Self-pay | Source: Home / Self Care | Attending: Internal Medicine

## 2018-11-14 ENCOUNTER — Encounter (HOSPITAL_COMMUNITY): Payer: Self-pay | Admitting: *Deleted

## 2018-11-14 DIAGNOSIS — Z9884 Bariatric surgery status: Secondary | ICD-10-CM | POA: Diagnosis not present

## 2018-11-14 DIAGNOSIS — E039 Hypothyroidism, unspecified: Secondary | ICD-10-CM | POA: Insufficient documentation

## 2018-11-14 DIAGNOSIS — K2270 Barrett's esophagus without dysplasia: Secondary | ICD-10-CM | POA: Insufficient documentation

## 2018-11-14 DIAGNOSIS — Z79899 Other long term (current) drug therapy: Secondary | ICD-10-CM | POA: Diagnosis not present

## 2018-11-14 DIAGNOSIS — G4700 Insomnia, unspecified: Secondary | ICD-10-CM | POA: Insufficient documentation

## 2018-11-14 DIAGNOSIS — Z96653 Presence of artificial knee joint, bilateral: Secondary | ICD-10-CM | POA: Insufficient documentation

## 2018-11-14 DIAGNOSIS — G4730 Sleep apnea, unspecified: Secondary | ICD-10-CM | POA: Diagnosis not present

## 2018-11-14 DIAGNOSIS — D509 Iron deficiency anemia, unspecified: Secondary | ICD-10-CM | POA: Insufficient documentation

## 2018-11-14 DIAGNOSIS — E119 Type 2 diabetes mellitus without complications: Secondary | ICD-10-CM | POA: Insufficient documentation

## 2018-11-14 DIAGNOSIS — Z86718 Personal history of other venous thrombosis and embolism: Secondary | ICD-10-CM | POA: Diagnosis not present

## 2018-11-14 DIAGNOSIS — K5900 Constipation, unspecified: Secondary | ICD-10-CM | POA: Diagnosis not present

## 2018-11-14 DIAGNOSIS — Q211 Atrial septal defect: Secondary | ICD-10-CM | POA: Diagnosis not present

## 2018-11-14 DIAGNOSIS — Z980 Intestinal bypass and anastomosis status: Secondary | ICD-10-CM

## 2018-11-14 DIAGNOSIS — Z79890 Hormone replacement therapy (postmenopausal): Secondary | ICD-10-CM | POA: Insufficient documentation

## 2018-11-14 DIAGNOSIS — F988 Other specified behavioral and emotional disorders with onset usually occurring in childhood and adolescence: Secondary | ICD-10-CM | POA: Diagnosis not present

## 2018-11-14 DIAGNOSIS — K219 Gastro-esophageal reflux disease without esophagitis: Secondary | ICD-10-CM | POA: Diagnosis not present

## 2018-11-14 DIAGNOSIS — Z8673 Personal history of transient ischemic attack (TIA), and cerebral infarction without residual deficits: Secondary | ICD-10-CM | POA: Insufficient documentation

## 2018-11-14 DIAGNOSIS — K228 Other specified diseases of esophagus: Secondary | ICD-10-CM

## 2018-11-14 DIAGNOSIS — F329 Major depressive disorder, single episode, unspecified: Secondary | ICD-10-CM | POA: Diagnosis not present

## 2018-11-14 DIAGNOSIS — K2970 Gastritis, unspecified, without bleeding: Secondary | ICD-10-CM | POA: Diagnosis not present

## 2018-11-14 DIAGNOSIS — Z87891 Personal history of nicotine dependence: Secondary | ICD-10-CM | POA: Diagnosis not present

## 2018-11-14 DIAGNOSIS — R110 Nausea: Secondary | ICD-10-CM | POA: Insufficient documentation

## 2018-11-14 DIAGNOSIS — R1012 Left upper quadrant pain: Secondary | ICD-10-CM

## 2018-11-14 DIAGNOSIS — G4733 Obstructive sleep apnea (adult) (pediatric): Secondary | ICD-10-CM | POA: Diagnosis not present

## 2018-11-14 DIAGNOSIS — K3189 Other diseases of stomach and duodenum: Secondary | ICD-10-CM

## 2018-11-14 DIAGNOSIS — Z7901 Long term (current) use of anticoagulants: Secondary | ICD-10-CM | POA: Insufficient documentation

## 2018-11-14 DIAGNOSIS — R1030 Lower abdominal pain, unspecified: Secondary | ICD-10-CM

## 2018-11-14 DIAGNOSIS — M797 Fibromyalgia: Secondary | ICD-10-CM | POA: Insufficient documentation

## 2018-11-14 DIAGNOSIS — R1013 Epigastric pain: Secondary | ICD-10-CM | POA: Diagnosis not present

## 2018-11-14 HISTORY — PX: ESOPHAGOGASTRODUODENOSCOPY (EGD) WITH PROPOFOL: SHX5813

## 2018-11-14 HISTORY — PX: BIOPSY: SHX5522

## 2018-11-14 LAB — GLUCOSE, CAPILLARY
Glucose-Capillary: 95 mg/dL (ref 70–99)
Glucose-Capillary: 106 mg/dL — ABNORMAL HIGH (ref 70–99)

## 2018-11-14 SURGERY — ESOPHAGOGASTRODUODENOSCOPY (EGD) WITH PROPOFOL
Anesthesia: General

## 2018-11-14 MED ORDER — KETAMINE HCL 50 MG/5ML IJ SOSY
PREFILLED_SYRINGE | INTRAMUSCULAR | Status: AC
  Filled 2018-11-14: qty 5

## 2018-11-14 MED ORDER — CHLORHEXIDINE GLUCONATE CLOTH 2 % EX PADS: 6.0000 | MEDICATED_PAD | Freq: Once | CUTANEOUS | Status: DC

## 2018-11-14 MED ORDER — PROMETHAZINE HCL 25 MG/ML IJ SOLN: 6.2500 mg | INTRAMUSCULAR | Status: DC | PRN

## 2018-11-14 MED ORDER — PROPOFOL 500 MG/50ML IV EMUL
INTRAVENOUS | Status: DC | PRN
  Administered 2018-11-14: 150 ug/kg/min via INTRAVENOUS

## 2018-11-14 MED ORDER — PROPOFOL 10 MG/ML IV BOLUS
INTRAVENOUS | Status: DC | PRN
  Administered 2018-11-14 (×3): 20 mg via INTRAVENOUS

## 2018-11-14 MED ORDER — MIDAZOLAM HCL 2 MG/2ML IJ SOLN: 0.5000 mg | Freq: Once | INTRAMUSCULAR | Status: DC | PRN

## 2018-11-14 MED ORDER — LACTATED RINGERS IV SOLN
INTRAVENOUS | Status: DC
  Administered 2018-11-14: 11:00:00 1000 mL via INTRAVENOUS

## 2018-11-14 MED ORDER — KETAMINE HCL 10 MG/ML IJ SOLN
INTRAMUSCULAR | Status: DC | PRN
  Administered 2018-11-14: 10 mg via INTRAVENOUS

## 2018-11-14 NOTE — Anesthesia Postprocedure Evaluation (Signed)
Anesthesia Post Note  Patient: Debbie Pineda  Procedure(s) Performed: ESOPHAGOGASTRODUODENOSCOPY (EGD) WITH PROPOFOL (N/A ) BIOPSY  Patient location during evaluation: PACU Anesthesia Type: General Level of consciousness: awake and alert and oriented Pain management: pain level controlled Vital Signs Assessment: post-procedure vital signs reviewed and stable Respiratory status: spontaneous breathing Cardiovascular status: blood pressure returned to baseline and stable Postop Assessment: no apparent nausea or vomiting Anesthetic complications: no     Last Vitals:  Vitals:   11/14/18 1012 11/14/18 1130  BP: 104/68 (!) 89/51  Pulse: (!) 50 (!) (P) 49  Resp: 20 (P) 16  Temp: 36.8 C (P) 36.7 C  SpO2: 96% (P) 97%    Last Pain:  Vitals:   11/14/18 1104  TempSrc:   PainSc: 0-No pain                 Craig Ionescu

## 2018-11-14 NOTE — Discharge Instructions (Signed)
Resume Xarelto on 11/15/2018. Discontinue famotidine. Resume other medications as before. Should be eating 6 small meals daily rather than 3. No driving for 24 hours. Physician will call with biopsy results.      Upper Endoscopy, Adult, Care After This sheet gives you information about how to care for yourself after your procedure. Your health care provider may also give you more specific instructions. If you have problems or questions, contact your health care provider. What can I expect after the procedure? After the procedure, it is common to have:  A sore throat.  Mild stomach pain or discomfort.  Bloating.  Nausea. Follow these instructions at home:   Follow instructions from your health care provider about what to eat or drink after your procedure.  Return to your normal activities as told by your health care provider. Ask your health care provider what activities are safe for you.  Take over-the-counter and prescription medicines only as told by your health care provider.  Do not drive for 24 hours if you were given a sedative during your procedure.  Keep all follow-up visits as told by your health care provider. This is important. Contact a health care provider if you have:  A sore throat that lasts longer than one day.  Trouble swallowing. Get help right away if:  You vomit blood or your vomit looks like coffee grounds.  You have: ? A fever. ? Bloody, black, or tarry stools. ? A severe sore throat or you cannot swallow. ? Difficulty breathing. ? Severe pain in your chest or abdomen. Summary  After the procedure, it is common to have a sore throat, mild stomach discomfort, bloating, and nausea.  Do not drive for 24 hours if you were given a sedative during the procedure.  Follow instructions from your health care provider about what to eat or drink after your procedure.  Return to your normal activities as told by your health care provider. This  information is not intended to replace advice given to you by your health care provider. Make sure you discuss any questions you have with your health care provider. Document Released: 07/10/2011 Document Revised: 07/02/2017 Document Reviewed: 06/10/2017 Elsevier Patient Education  2020 [place] After These instructions provide you with information about caring for yourself after your procedure. Your health care provider may also give you more specific instructions. Your treatment has been planned according to current medical practices, but problems sometimes occur. Call your health care provider if you have any problems or questions after your procedure. What can I expect after the procedure? After your procedure, you may:  Feel sleepy for several hours.  Feel clumsy and have poor balance for several hours.  Feel forgetful about what happened after the procedure.  Have poor judgment for several hours.  Feel nauseous or vomit.  Have a sore throat if you had a breathing tube during the procedure. Follow these instructions at home: For at least 24 hours after the procedure:      Have a responsible adult stay with you. It is important to have someone help care for you until you are awake and alert.  Rest as needed.  Do not: ? Participate in activities in which you could fall or become injured. ? Drive. ? Use heavy machinery. ? Drink alcohol. ? Take sleeping pills or medicines that cause drowsiness. ? Make important decisions or sign legal documents. ? Take care of children on your own. Eating and  drinking  Follow the diet that is recommended by your health care provider.  If you vomit, drink water, juice, or soup when you can drink without vomiting.  Make sure you have little or no nausea before eating solid foods. General instructions  Take over-the-counter and prescription medicines only as told by your health care  provider.  If you have sleep apnea, surgery and certain medicines can increase your risk for breathing problems. Follow instructions from your health care provider about wearing your sleep device: ? Anytime you are sleeping, including during daytime naps. ? While taking prescription pain medicines, sleeping medicines, or medicines that make you drowsy.  If you smoke, do not smoke without supervision.  Keep all follow-up visits as told by your health care provider. This is important. Contact a health care provider if:  You keep feeling nauseous or you keep vomiting.  You feel light-headed.  You develop a rash.  You have a fever. Get help right away if:  You have trouble breathing. Summary  For several hours after your procedure, you may feel sleepy and have poor judgment.  Have a responsible adult stay with you for at least 24 hours or until you are awake and alert. This information is not intended to replace advice given to you by your health care provider. Make sure you discuss any questions you have with your health care provider. Document Released: 05/01/2015 Document Revised: 04/08/2017 Document Reviewed: 05/01/2015 Elsevier Patient Education  2020 [employer].

## 2018-11-14 NOTE — Op Note (Signed)
[facility] Patient Name: Debbie Pineda Procedure Date: 11/14/2018 10:52 AM MRN: [id number] Date of Birth: 06-01-1960 Attending MD: Hildred Laser , MD CSN: [id number] Age: 58 Admit Type: Outpatient Procedure:                Upper GI endoscopy Indications:              Epigastric abdominal pain, Abdominal pain in the                            left upper quadrant Providers:                Hildred Laser, MD, Otis Peak B. Sharon Seller, RN, Nelma Rothman, Technician Referring MD:             Jake Samples PA Medicines:                Propofol per Anesthesia Complications:            No immediate complications. Estimated Blood Loss:     Estimated blood loss was minimal. Procedure:                Pre-Anesthesia Assessment:                           - Prior to the procedure, a History and Physical                            was performed, and patient medications and                            allergies were reviewed. The patient's tolerance of                            previous anesthesia was also reviewed. The risks                            and benefits of the procedure and the sedation                            options and risks were discussed with the patient.                            All questions were answered, and informed consent                            was obtained. Prior Anticoagulants: The patient                            last took Xarelto (rivaroxaban) 3 days prior to the                            procedure. ASA Grade Assessment: IV - A patient  with severe systemic disease that is a constant                            threat to life. After reviewing the risks and                            benefits, the patient was deemed in satisfactory                            condition to undergo the procedure.                           After obtaining informed consent, the endoscope was                            passed  under direct vision. Throughout the                            procedure, the patient's blood pressure, pulse, and                            oxygen saturations were monitored continuously. The                            [id number]) scope was introduced through the                            mouth, and advanced to the mid-jejunum. The upper                            GI endoscopy was accomplished without difficulty.                            The patient tolerated the procedure well. Scope In: 11:09:53 AM Scope Out: 11:23:58 AM Total Procedure Duration: 0 hours 14 minutes 5 seconds  Findings:      The examined esophagus was normal.      The Z-line was irregular and was found 40 cm from the incisors. Biopsies       were taken with a cold forceps for histology. The pathology specimen was       placed into Bottle Number 2.      Evidence of a Roux-en-Y gastrojejunostomy was found. The gastrojejunal       anastomosis was characterized by erythema. This was traversed.      Patchy mildly erythematous mucosa without bleeding was found in the       gastric body. Biopsies were taken with a cold forceps for histology. The       pathology specimen was placed into Bottle Number 1.      The examined jejunum was normal. Impression:               - Normal esophagus.                           - Z-line irregular, 40 cm from the incisors.  Biopsied.                           - Roux-en-Y gastrojejunostomy with gastrojejunal                            anastomosis characterized by erythema. It was wide                            open.                           - Small gastric pouch withErythematous mucosa.                            Biopsied.                           - Normal examined jejunum for 40 cm. Moderate Sedation:      Per Anesthesia Care Recommendation:           - Patient has a contact number available for                            emergencies. The signs and  symptoms of potential                            delayed complications were discussed with the                            patient. Return to normal activities tomorrow.                            Written discharge instructions were provided to the                            patient.                           - Resume previous diet today.Six small meals daily.                           - Discontinue Famotidine but continue present                            medications.                           - Await pathology results. Procedure Code(s):        --- Professional ---                           [phone], Esophagogastroduodenoscopy, flexible,                            transoral; with biopsy, single or multiple Diagnosis Code(s):        --- Professional ---  K22.8, Other specified diseases of esophagus                           Z98.0, Intestinal bypass and anastomosis status                           K31.89, Other diseases of stomach and duodenum                           R10.13, Epigastric pain                           R10.12, Left upper quadrant pain CPT copyright 2019 American Medical Association. All rights reserved. The codes documented in this report are preliminary and upon coder review may  be revised to meet current compliance requirements. Hildred Laser, MD Hildred Laser, MD 11/14/2018 11:36:59 AM This report has been signed electronically. Number of Addenda: 0

## 2018-11-14 NOTE — Anesthesia Preprocedure Evaluation (Signed)
Anesthesia Evaluation  Patient identified by MRN, date of birth, ID band Patient awake  General Assessment Comment:Pt reports PONV Pt reports that she and her mother have decreased BP with anesthesia -denies unexpected ICU stays   Reviewed: Allergy & Precautions, NPO status , Patient's Chart, lab work & pertinent test results  History of Anesthesia Complications (+) Family history of anesthesia reaction and history of anesthetic complications  Airway Mallampati: II  TM Distance: >3 FB Neck ROM: Full    Dental no notable dental hx. 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  Reports intermittant LFT elevations    Endo/Other  diabetes, Type 2Hypothyroidism No current DM meds   Renal/GU negative Renal ROS  negative genitourinary   Musculoskeletal  (+) Arthritis , Osteoarthritis,  Fibromyalgia -, narcotic dependentOn nycenta for pain issues    Abdominal   Peds negative pediatric ROS (+)  Hematology negative hematology ROS (+) anemia ,   Anesthesia Other Findings   Reproductive/Obstetrics negative OB ROS                             Anesthesia Physical Anesthesia Plan  ASA: IV  Anesthesia Plan: General   Post-op Pain Management:    Induction: Intravenous  PONV Risk Score and Plan: 3 and TIVA, Propofol  infusion, Ondansetron and Treatment may vary due to age or medical condition  Airway Management Planned: Nasal Cannula and Simple Face Mask  Additional Equipment:   Intra-op Plan:   Post-operative Plan:   Informed Consent: I have reviewed the patients History and Physical, chart, labs and discussed the procedure including the risks, benefits and alternatives for the proposed anesthesia with the patient or authorized representative who has indicated his/her understanding and acceptance.     Dental advisory given  Plan Discussed with: CRNA  Anesthesia Plan Comments: (Plan Full PPE use  Plan GA with GETA as needed d/w pt -WTP with same after Q&A)        Anesthesia Quick Evaluation

## 2018-11-14 NOTE — H&P (Addendum)
Debbie Pineda is an 58 y.o. female.   Chief Complaint: Patient is here for esophagogastroduodenoscopy. HPI: Patient is 58 year old Caucasian female with multiple medical problems who had Roux-en-Y procedure about 5 years ago for obesity and she managed to lose 100 pounds and she has been able to maintain her weight.  She presents with 1 year history of epigastric pain to the left of midline.  Seem to be worse after eating.  She has had nausea and heaving but no vomiting.  For the last 2 months she has had burping when she drinks water or takes pills.  She burps multiple times at night.  She has difficulty swallowing pills but no difficulty with food.  She says she is eating 3 meals a day and not 6.  She denies melena or rectal bleeding.  She has a history of intermittent bump in her transaminases felt to be either due to microlithiasis or sphincter of Oddi dysfunction.  Imaging studies have not been helpful.  The gastric pain however does not radiate to the right or back. She has been off Xarelto for 2 days. Patient says she has 2 drinks of cappuccino in the morning and 2 in the evening.  She wonders if this could be causing her pain. She does not drink alcohol or smoke cigarettes.  Past Medical History:  Diagnosis Date  . ADD (attention deficit disorder)    takes Adderall daily  . Arthritis    "all over"  . Cerebral infarction ([place]) 10/30/2011  . Cerebrovascular disease 08/14/2016  . Chronic back pain    "all over"  . Chronic low back pain 01/11/2016  . Complication of anesthesia    tends to have hypotension when NPO and post-anesthesia  . Constipation    takes stool softener daily  . Degenerative disk disease   . Degenerative joint disease   . Depression    takes Cymbalta daily for pain per pt  . Diabetes mellitus without complication ([place])   . DVT (deep venous thrombosis) ([facility])    RLE  . Family history of adverse reaction to anesthesia    a family member woke up during surgery;  "think it was my mom"  . Fibromyalgia   . Generalized osteoarthritis of multiple sites 11/04/2013  . GERD (gastroesophageal reflux disease)   . Heart palpitations 12/14/2013  . History of blood clots    superficial  . Hypoglycemia   . Hypothyroid    takes Synthroid daily  . Incomplete emptying of bladder   . Insomnia    takes Trazodone nightly  . Iron deficiency anemia    takes Ferrous Sulfate daily  . Joint pain   . Joint swelling    knees and ankles  . Memory disorder 08/14/2016  . Morbid obesity ([place])   . Nausea    takes Zofran as needed.Seeing GI doc  . Neck pain 05/25/2016  . OSA on CPAP    tested more than 5 yrs ago.    . Osteoarthritis   . PFO (patent foramen ovale)   . Primary osteoarthritis of both feet 05/29/2016   Right bunionectomy August 2017 by Dr. Sharol Given  . Scoliosis   . Sleep apnea   . Stroke [facility]) "several"   right foot weakness; memory issues, black spot right visual field since" (03/23/2015)  . Thrombophlebitis   . Trochanteric bursitis of both hips 05/25/2016  . Unilateral primary osteoarthritis, right knee 05/29/2016  . Urinary urgency 04/19/2016  . Vein disorder 11/29/2010    Past Surgical  History:  Procedure Laterality Date  . BONE EXCISION Right 08/29/2017   Procedure: right trapezium excision;  Surgeon: Daryll Brod, MD;  Location: [place];  Service: Orthopedics;  Laterality: Right;  . BUNIONECTOMY Right 08/2015  . CARDIAC CATHETERIZATION     2008.  "it was fine" (not sure why she had it done, and doesn't know where)  . CARPOMETACARPEL SUSPENSION PLASTY Right 08/29/2017   Procedure: SUSPENSION PLASTY RIGHT THUMB;  Surgeon: Daryll Brod, MD;  Location: [place];  Service: Orthopedics;  Laterality: Right;  . COLONOSCOPY N/A 03/25/2013   Procedure: COLONOSCOPY;  Surgeon: Rogene Houston, MD;  [facility];  Service: Endoscopy;  Laterality: N/A;  930  . ESOPHAGOGASTRODUODENOSCOPY    . EXPLORATORY LAPAROTOMY     "took  fallopian tubes out"  . JOINT REPLACEMENT     bil knee   . KNEE ARTHROPLASTY    . KNEE ARTHROSCOPY Left   . KNEE ARTHROSCOPY W/ ACL RECONSTRUCTION Right    "added pins"  . LAPAROSCOPIC CHOLECYSTECTOMY  ~ 2001  . 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 SPINAL CORD STIMULATOR INSERTION N/A 04/18/2017   Procedure: LUMBAR SPINAL CORD STIMULATOR INSERTION;  Surgeon: Clydell Hakim, MD;  Location: [place];  Service: Neurosurgery;  Laterality: N/A;  LUMBAR SPINAL CORD STIMULATOR INSERTION  . TENDON TRANSFER Right 08/29/2017   Procedure: right abductor pollicis longus transfer;  Surgeon: Daryll Brod, MD;  Location: [place];  Service: Orthopedics;  Laterality: Right;  . TOTAL KNEE ARTHROPLASTY Left 03/23/2015   Procedure: TOTAL KNEE ARTHROPLASTY;  Surgeon: Newt Minion, MD;  Location: [place];  Service: Orthopedics;  Laterality: Left;  . TOTAL KNEE ARTHROPLASTY Right 08/15/2016   Procedure: RIGHT TOTAL KNEE ARTHROPLASTY, REMOVAL ACL SCREWS;  Surgeon: Newt Minion, MD;  Location: [place];  Service: Orthopedics;  Laterality: Right;  . TOTAL KNEE ARTHROPLASTY WITH HARDWARE REMOVAL Right   . VAGINAL HYSTERECTOMY    . VARICOSE VEIN SURGERY Right X 2    Family History  Problem Relation Age of Onset  . Heart disease Father   . Cancer Father   . Parkinson's disease Father   . Cancer Mother        skin cancer   . Myasthenia gravis Mother   . Heart disease Brother   . Cancer Brother   . Diabetes Brother   . Stroke Brother   . Heart disease Sister   . Heart attack Sister   . Cancer Maternal Grandfather   . Hypothyroidism Daughter   . Hypertension Other   . Colon cancer Neg Hx    Social History:  reports that she quit smoking about 27 years ago. Her smoking use included cigarettes. She has a 6.00 pack-year smoking history. She has never used smokeless tobacco. She reports that she does not drink alcohol or use drugs.  Allergies:  Allergies  Allergen Reactions  . Lyrica [Pregabalin]  Shortness Of Breath and Swelling    lower extremity edema and weight gain  . Belsomra [Suvorexant] Other (See Comments)    unknown  . Morphine And Related Itching    Upper torso  . Sulfamethoxazole-Trimethoprim Itching and Rash    Bactrim  . Tape Itching and Rash    Please use "paper" tape Rash if left on longer than 24 hrs    Medications Prior to Admission  Medication Sig Dispense Refill  . baclofen (LIORESAL) 10 MG tablet Take 10 mg by mouth 3 (three) times daily.  1  . Cyanocobalamin (VITAMIN B-12) 5000 MCG SUBL Place 5,000 mcg under the tongue daily.     . diclofenac sodium (VOLTAREN) 1 % GEL Apply 4 g topically 4 (four) times daily as needed (PAIN). 5 Tube 1  . docusate sodium (COLACE) 100 MG capsule Take 100-200 mg by mouth 2 (two) times daily as needed for moderate constipation.     Marland Kitchen donepezil (ARICEPT) 10 MG tablet Take 1 tablet (10 mg total) by mouth at bedtime. 90 tablet 1  . DULoxetine (CYMBALTA) 60 MG capsule Take 60 mg by mouth 2 (two) times daily.  11  . famotidine (PEPCID) 20 MG tablet Take 20 mg by mouth daily.    . folic acid (FOLVITE) 818 MCG tablet Take 400 mcg by mouth daily.    Marland Kitchen gabapentin (NEURONTIN) 800 MG tablet Take 1 tablet (800 mg total) by mouth 3 (three) times daily.    Javier Docker Oil 350 MG CAPS Take 350 mg by mouth at bedtime.     Marland Kitchen L-Methylfolate-B12-B6-B2 (METAFOLBIC) 06-22-48-5 MG TABS Take 1 tablet by mouth daily. (Patient taking differently: Take 1 tablet by mouth at bedtime. ) 90 tablet 3  . levothyroxine (SYNTHROID) 150 MCG tablet Take 150 mcg by mouth daily before breakfast.    . methylphenidate (CONCERTA) 27 MG PO CR tablet Take 1 tablet (27 mg total) by mouth 2 (two) times daily. 60 tablet 0  . mirabegron ER (MYRBETRIQ) 50 MG TB24 tablet Take 50 mg by mouth daily.    . Multiple Minerals-Vitamins (CAL-MAG-ZINC-D PO) Take 3 tablets by mouth daily.     . Multiple Vitamins-Minerals (ONE-A-DAY WOMENS PETITES PO) Take 1 tablet by mouth 2 (two) times  daily.     Marland Kitchen omeprazole (PRILOSEC) 40 MG capsule Take 1 capsule by mouth twice daily. (Patient taking differently: Take 40 mg by mouth 2 (two) times daily at 8 am and 10 pm. ) 60 capsule 5  . rivaroxaban (XARELTO) 20 MG TABS tablet Take 1 tablet (20 mg total) by mouth daily with supper. (Patient taking differently: Take 20 mg by mouth at bedtime. ) 30 tablet 1  . Tapentadol HCl (NUCYNTA) 100 MG TABS Take 50 mg by mouth 3 (three) times daily as needed (pain).     . topiramate (TOPAMAX) 100 MG tablet Take 1 tablet (100 mg total) by mouth 2 (two) times daily. 60 tablet 5  . traZODone (DESYREL) 100 MG tablet Take 200 mg by mouth at bedtime.     . vitamin C (ASCORBIC ACID) 500 MG tablet Take 500 mg by mouth daily.    . Vitamin D, Ergocalciferol, (DRISDOL) 50000 UNITS CAPS Take 50,000 Units by mouth every 7 (seven) days.     Marland Kitchen amoxicillin (AMOXIL) 500 MG capsule Take 2,000 mg by mouth See admin instructions. Take 2000 mg by mouth 1 hour prior to dental appointment    . Erenumab-aooe (AIMOVIG) 140 MG/ML SOAJ Inject 140 mg into the skin every 30 (thirty) days. 1 pen 6  . famotidine (PEPCID) 40 MG tablet Take 1 tablet (40 mg total) by mouth daily. 30 tablet 1  . fluconazole (DIFLUCAN) 150 MG tablet Take 150 mg by mouth daily as needed (yeast infection).     Karma Greaser 4 MG/0.1ML LIQD nasal spray kit Place 0.4 mg into the nose once.     . ondansetron (ZOFRAN-ODT) 4 MG disintegrating tablet Take 4 mg by mouth every 8 (eight) hours as needed for nausea or vomiting.      Results for orders  placed or performed during the hospital encounter of 11/14/18 (from the past 48 hour(s))  Glucose, capillary     Status: None   Collection Time: 11/14/18 10:36 AM  Result Value Ref Range   Glucose-Capillary 95 70 - 99 mg/dL   No results found.  ROS  Blood pressure 104/68, pulse (!) 50, temperature 98.3 F (36.8 C), temperature source Oral, resp. rate 20, SpO2 96 %. Physical Exam  Constitutional: She appears  well-nourished.  HENT:  Mouth/Throat: Oropharynx is clear and moist.  Eyes: Conjunctivae are normal. No scleral icterus.  Neck: No thyromegaly present.  Cardiovascular: Normal rate, regular rhythm and normal heart sounds.  No murmur heard. Respiratory: Effort normal and breath sounds normal.  GI:  Abdomen is full but symmetrical.  She has laparoscopy scars.  Abdomen is soft with mild tenderness in epigastrium to the left of midline as well as below the right costal margin.  No organomegaly or masses.  Musculoskeletal:        General: No edema.  Neurological: She is alert.  Skin: Skin is warm and dry.     Assessment/Plan Epigastric/LUQ abdominal pain with nausea and burping. Pill dysphagia. Diagnostic esophagogastroduodenoscopy.  Hildred Laser, MD 11/14/2018, 10:57 AM

## 2018-11-14 NOTE — Transfer of Care (Signed)
Immediate Anesthesia Transfer of Care Note  Patient: Debbie Pineda  Procedure(s) Performed: ESOPHAGOGASTRODUODENOSCOPY (EGD) WITH PROPOFOL (N/A ) BIOPSY  Patient Location: PACU  Anesthesia Type:General  Level of Consciousness: awake  Airway & Oxygen Therapy: Patient Spontanous Breathing  Post-op Assessment: Report given to RN  Post vital signs: Reviewed and stable  Last Vitals:  Vitals Value Taken Time  BP 91/55 11/14/18 1133  Temp    Pulse 54 11/14/18 1136  Resp 13 11/14/18 1136  SpO2 97 % 11/14/18 1136  Vitals shown include unvalidated device data.  Last Pain:  Vitals:   11/14/18 1104  TempSrc:   PainSc: 0-No pain      Patients Stated Pain Goal: 6 [id number] [phone])  Complications: No apparent anesthesia complications

## 2018-11-17 LAB — SURGICAL PATHOLOGY

## 2018-11-19 ENCOUNTER — Other Ambulatory Visit (INDEPENDENT_AMBULATORY_CARE_PROVIDER_SITE_OTHER): Payer: Self-pay | Admitting: *Deleted

## 2018-11-19 ENCOUNTER — Encounter (HOSPITAL_COMMUNITY): Payer: Self-pay | Admitting: Internal Medicine

## 2018-11-19 DIAGNOSIS — R748 Abnormal levels of other serum enzymes: Secondary | ICD-10-CM

## 2018-11-20 ENCOUNTER — Ambulatory Visit (INDEPENDENT_AMBULATORY_CARE_PROVIDER_SITE_OTHER): Payer: PPO | Admitting: Physician Assistant

## 2018-11-20 ENCOUNTER — Encounter: Payer: Self-pay | Admitting: Physician Assistant

## 2018-11-20 ENCOUNTER — Other Ambulatory Visit: Payer: Self-pay

## 2018-11-20 DIAGNOSIS — G4700 Insomnia, unspecified: Secondary | ICD-10-CM | POA: Diagnosis not present

## 2018-11-20 DIAGNOSIS — F900 Attention-deficit hyperactivity disorder, predominantly inattentive type: Secondary | ICD-10-CM | POA: Diagnosis not present

## 2018-11-20 DIAGNOSIS — R413 Other amnesia: Secondary | ICD-10-CM

## 2018-11-20 DIAGNOSIS — Z79899 Other long term (current) drug therapy: Secondary | ICD-10-CM | POA: Diagnosis not present

## 2018-11-20 MED ORDER — METHYLPHENIDATE HCL ER (OSM) 27 MG PO TBCR: 27.0000 mg | EXTENDED_RELEASE_TABLET | Freq: Two times a day (BID) | ORAL | 0 refills | Status: DC

## 2018-11-20 NOTE — Progress Notes (Signed)
[facility]  Patient ID: Debbie Pineda,  MRN: [id number]  PCP: Jake Samples, PA-C  Date of Evaluation: 11/20/2018 Time spent:15 minutes  Chief Complaint:  Chief Complaint    Follow-up     Virtual Visit via Telephone Note  I connected with patient by a video enabled telemedicine application or telephone, with their informed consent, and verified patient privacy and that I am speaking with the correct person using two identifiers.  I am private, in my office and the patient is home.  I discussed the limitations, risks, security and privacy concerns of performing an evaluation and management service by telephone and the availability of in person appointments. I also discussed with the patient that there may be a patient responsible charge related to this service. The patient expressed understanding and agreed to proceed.   I discussed the assessment and treatment plan with the patient. The patient was provided an opportunity to ask questions and all were answered. The patient agreed with the plan and demonstrated an understanding of the instructions.   The patient was advised to call back or seek an in-person evaluation if the symptoms worsen or if the condition fails to improve as anticipated.  I provided 15 minutes of non-face-to-face time during this encounter.  HISTORY/CURRENT STATUS: HPI For routine f/u.  Still c/o memory problems.  Sees Neuro for migraines but says they aren't dealing w/ the memory.  However, she was Rx Aricept by their NP. Pt states it doesn't help. Pt had NeuroPsych testing by Dr. Halina Andreas last year.   The Concerta helps a lot.  More energy and more clarity of thought. Is able to enjoy things. Not crying easily.  Not sleeping all the time. She sleeps well with the trazodone. No SI/HI.  Patient denies increased energy with decreased need for sleep, no increased talkativeness, no racing thoughts, no impulsivity or risky behaviors, no  increased spending, no increased libido, no grandiosity.  She continues to stay with her mother to help her out.  Otherwise she is not working.    Denies dizziness, syncope, seizures, numbness, tingling, tremor, tics, unsteady gait, slurred speech, confusion. Denies muscle or joint pain, stiffness, or dystonia.  Individual Medical History/ Review of Systems: Changes? :No    Past medications for mental health diagnoses include: Adderall, Concerta, Lunesta, Ambien, Trazodone, Belsomra, Benadryl, Melatonin, Sonata,Celexa, Xanax, Ritalin, Topamax, Cymbalta, Aricept  Allergies: Lyrica [pregabalin], Belsomra [suvorexant], Morphine and related, Sulfamethoxazole-trimethoprim, and Tape  Current Medications:  Current Outpatient Medications:  .  baclofen (LIORESAL) 10 MG tablet, Take 10 mg by mouth 3 (three) times daily. , Disp: , Rfl: 1 .  Cyanocobalamin (VITAMIN B-12) 5000 MCG SUBL, Place 5,000 mcg under the tongue daily. , Disp: , Rfl:  .  diclofenac sodium (VOLTAREN) 1 % GEL, Apply 4 g topically 4 (four) times daily as needed (PAIN)., Disp: 5 Tube, Rfl: 1 .  docusate sodium (COLACE) 100 MG capsule, Take 100-200 mg by mouth 2 (two) times daily as needed for moderate constipation. , Disp: , Rfl:  .  donepezil (ARICEPT) 10 MG tablet, Take 1 tablet (10 mg total) by mouth at bedtime., Disp: 90 tablet, Rfl: 1 .  DULoxetine (CYMBALTA) 60 MG capsule, Take 60 mg by mouth 2 (two) times daily., Disp: , Rfl: 11 .  Erenumab-aooe (AIMOVIG) 140 MG/ML SOAJ, Inject 140 mg into the skin every 30 (thirty) days., Disp: 1 pen, Rfl: 6 .  famotidine (PEPCID) 20 MG tablet, Take 20 mg by mouth daily., Disp: , Rfl:  .  fluconazole (DIFLUCAN) 150 MG tablet, Take 150 mg by mouth daily as needed (yeast infection). , Disp: , Rfl:  .  folic acid (FOLVITE) 720 MCG tablet, Take 400 mcg by mouth daily., Disp: , Rfl:  .  gabapentin (NEURONTIN) 800 MG tablet, Take 1 tablet (800 mg total) by mouth 3 (three) times daily., Disp: ,  Rfl:  .  Krill Oil 350 MG CAPS, Take 350 mg by mouth at bedtime. , Disp: , Rfl:  .  L-Methylfolate-B12-B6-B2 (METAFOLBIC) 06-22-48-5 MG TABS, Take 1 tablet by mouth daily. (Patient taking differently: Take 1 tablet by mouth at bedtime. ), Disp: 90 tablet, Rfl: 3 .  levothyroxine (SYNTHROID) 150 MCG tablet, Take 150 mcg by mouth daily before breakfast., Disp: , Rfl:  .  methylphenidate (CONCERTA) 27 MG PO CR tablet, Take 1 tablet (27 mg total) by mouth 2 (two) times daily., Disp: 60 tablet, Rfl: 0 .  mirabegron ER (MYRBETRIQ) 50 MG TB24 tablet, Take 50 mg by mouth daily., Disp: , Rfl:  .  Multiple Minerals-Vitamins (CAL-MAG-ZINC-D PO), Take 3 tablets by mouth daily. , Disp: , Rfl:  .  Multiple Vitamins-Minerals (ONE-A-DAY WOMENS PETITES PO), Take 1 tablet by mouth 2 (two) times daily. , Disp: , Rfl:  .  NARCAN 4 MG/0.1ML LIQD nasal spray kit, Place 0.4 mg into the nose once. , Disp: , Rfl:  .  omeprazole (PRILOSEC) 40 MG capsule, Take 1 capsule by mouth twice daily. (Patient taking differently: Take 40 mg by mouth daily. ), Disp: 60 capsule, Rfl: 5 .  ondansetron (ZOFRAN-ODT) 4 MG disintegrating tablet, Take 4 mg by mouth every 8 (eight) hours as needed for nausea or vomiting., Disp: , Rfl:  .  rivaroxaban (XARELTO) 20 MG TABS tablet, Take 1 tablet (20 mg total) by mouth at bedtime., Disp: , Rfl:  .  Tapentadol HCl (NUCYNTA) 100 MG TABS, Take 50 mg by mouth 3 (three) times daily as needed (pain). , Disp: , Rfl:  .  topiramate (TOPAMAX) 100 MG tablet, Take 1 tablet (100 mg total) by mouth 2 (two) times daily., Disp: 60 tablet, Rfl: 5 .  traZODone (DESYREL) 100 MG tablet, Take 200 mg by mouth at bedtime. , Disp: , Rfl:  .  vitamin C (ASCORBIC ACID) 500 MG tablet, Take 500 mg by mouth daily., Disp: , Rfl:  .  Vitamin D, Ergocalciferol, (DRISDOL) 50000 UNITS CAPS, Take 50,000 Units by mouth every 7 (seven) days. , Disp: , Rfl:  .  amoxicillin (AMOXIL) 500 MG capsule, Take 2,000 mg by mouth See admin  instructions. Take 2000 mg by mouth 1 hour prior to dental appointment, Disp: , Rfl:  Medication Side Effects: none  Family Medical/ Social History: Changes? No  MENTAL HEALTH EXAM:  There were no vitals taken for this visit.There is no height or weight on file to calculate BMI.  General Appearance: Unable to assess  Eye Contact:  Unable to assess  Speech:  Clear and Coherent  Volume:  Normal  Mood:  Euthymic  Affect:  Unable to assess  Thought Process:  Goal Directed and Descriptions of Associations: Intact  Orientation:  Full (Time, Place, and Person)  Thought Content: Logical   Suicidal Thoughts:  No  Homicidal Thoughts:  No  Memory:  Immediate;   Good Recent;   Fair Remote;   Good and Fair  Judgement:  Good  Insight:  Good  Psychomotor Activity:  Unable to assess  Concentration:  Concentration: Good and Attention Span: Good  Recall:  Good  Fund of Knowledge: Good  Language: Good  Assets:  Desire for Improvement  ADL's:  Intact  Cognition: WNL  Prognosis:  Good    DIAGNOSES:    ICD-10-CM   1. Attention deficit hyperactivity disorder (ADHD), predominantly inattentive type  F90.0   2. Memory disorder  R41.3   3. Insomnia, unspecified type  G47.00   4. Polypharmacy  Z79.899     Receiving Psychotherapy: No    RECOMMENDATIONS:  I have reviewed the results of Dr. Corliss Blacker testing from last year.  I certainly agree with her assessment that part of the problem could be related to her medications.  From my standpoint however I do not think the memory is a psychiatric issue and recommend no medication changes on my end.  I do recommend that she talk with her neurologist about this again. Continue Concerta 27 mg 1 twice daily.  She knows not to take the second dose after around 2 PM or else it may keep her up. Continue trazodone 100-200 mg nightly as needed. Continue Aricept 10 mg nightly. (per another provider) Continue Cymbalta 60 mg 2 daily."      "      "      " Continue gabapentin 800 mg 3 times daily. "      "    " Continue Cerefolin daily.  "     "    "       "          " Continue Topamax 100 mg twice daily. "   "     "    " Continue B complex, vitamin D and multivitamin daily. I will send a note to her PCP and see if they feel comfortable giving her the Concerta.  If they do, she can see them and does not need to return here, only return as needed.  If they are not comfortable with writing that then I will see her again in 3 months.  We will let her know.  Donnal Moat, PA-C

## 2018-11-27 DIAGNOSIS — M159 Polyosteoarthritis, unspecified: Secondary | ICD-10-CM | POA: Diagnosis not present

## 2018-11-27 DIAGNOSIS — M797 Fibromyalgia: Secondary | ICD-10-CM | POA: Diagnosis not present

## 2018-11-27 DIAGNOSIS — M47816 Spondylosis without myelopathy or radiculopathy, lumbar region: Secondary | ICD-10-CM | POA: Diagnosis not present

## 2018-11-27 DIAGNOSIS — M5136 Other intervertebral disc degeneration, lumbar region: Secondary | ICD-10-CM | POA: Diagnosis not present

## 2018-11-27 NOTE — Progress Notes (Deleted)
Office Visit Note  Patient: Debbie Pineda             Date of Birth: [birth year]           MRN: [id number]             PCP: Jake Samples, PA-C Referring: Jake Samples, [place]* Visit Date: 12/11/2018 Occupation: @GUAROCC @  Subjective:  No chief complaint on file.   History of Present Illness: Debbie Pineda is a 58 y.o. female ***   Activities of Daily Living:  Patient reports morning stiffness for *** {minute/hour:19697}.   Patient {ACTIONS;DENIES/REPORTS:21021675::"Denies"} nocturnal pain.  Difficulty dressing/grooming: {ACTIONS;DENIES/REPORTS:21021675::"Denies"} Difficulty climbing stairs: {ACTIONS;DENIES/REPORTS:21021675::"Denies"} Difficulty getting out of chair: {ACTIONS;DENIES/REPORTS:21021675::"Denies"} Difficulty using hands for taps, buttons, cutlery, and/or writing: {ACTIONS;DENIES/REPORTS:21021675::"Denies"}  No Rheumatology ROS completed.   PMFS History:  Patient Active Problem List   Diagnosis Date Noted  . Migraine 11/11/2018  . Abdominal pain, epigastric 10/14/2018  . LUQ pain 10/14/2018  . Attention deficit hyperactivity disorder (ADHD) 01/12/2018  . Insomnia 01/12/2018  . Elevated liver enzymes 09/10/2017  . History of diabetes mellitus 06/06/2017  . Primary osteoarthritis of right hand 06/06/2017  . Transaminasemia   . TIA (transient ischemic attack) 05/01/2017  . Dysphasia 05/01/2017  . Chronic pain syndrome 05/01/2017  . Confusion   . Presence of right artificial knee joint 09/17/2016  . H/O total knee replacement, right 08/15/2016  . Presence of retained hardware   . Memory disorder 08/14/2016  . Cerebrovascular disease 08/14/2016  . Unilateral primary osteoarthritis, right knee 05/29/2016  . Primary osteoarthritis of both feet 05/29/2016  . Trochanteric bursitis of both hips 05/25/2016  . Neck pain 05/25/2016  . Urinary urgency 04/19/2016  . Chronic low back pain 01/11/2016  . Depression 11/29/2015  . Total knee replacement  status 03/23/2015  . Heart palpitations 12/14/2013  . Generalized osteoarthritis of multiple sites 11/04/2013  . Cerebral infarction ([place]) 10/30/2011  . PFO (patent foramen ovale) 10/30/2011  . Bradycardia 10/30/2011  . Chest pain 10/30/2011  . Hypothyroid   . Thrombophlebitis   . Sleep apnea   . Fibromyalgia   . Status post bariatric surgery 12/07/2010  . Vein disorder 11/29/2010  . S/P total hysterectomy and bilateral salpingo-oophorectomy 11/29/2010  . S/P exploratory laparotomy 11/29/2010  . S/P cholecystectomy 11/29/2010  . S/P ACL surgery 11/29/2010  . Morbid obesity ([place]) 11/10/2010    Past Medical History:  Diagnosis Date  . ADD (attention deficit disorder)    takes Adderall daily  . Arthritis    "all over"  . Cerebral infarction ([place]) 10/30/2011  . Cerebrovascular disease 08/14/2016  . Chronic back pain    "all over"  . Chronic low back pain 01/11/2016  . Complication of anesthesia    tends to have hypotension when NPO and post-anesthesia  . Constipation    takes stool softener daily  . Degenerative disk disease   . Degenerative joint disease   . Depression    takes Cymbalta daily for pain per pt  . Diabetes mellitus without complication ([place])   . DVT (deep venous thrombosis) ([facility])    RLE  . Family history of adverse reaction to anesthesia    a family member woke up during surgery; "think it was my mom"  . Fibromyalgia   . Generalized osteoarthritis of multiple sites 11/04/2013  . GERD (gastroesophageal reflux disease)   . Heart palpitations 12/14/2013  . History of blood clots    superficial  . Hypoglycemia   . Hypothyroid  takes Synthroid daily  . Incomplete emptying of bladder   . Insomnia    takes Trazodone nightly  . Iron deficiency anemia    takes Ferrous Sulfate daily  . Joint pain   . Joint swelling    knees and ankles  . Memory disorder 08/14/2016  . Morbid obesity ([place])   . Nausea    takes Zofran as needed.Seeing GI doc  . Neck pain  05/25/2016  . OSA on CPAP    tested more than 5 yrs ago.    . Osteoarthritis   . PFO (patent foramen ovale)   . Primary osteoarthritis of both feet 05/29/2016   Right bunionectomy August 2017 by Dr. Sharol Given  . Scoliosis   . Sleep apnea   . Stroke [facility]) "several"   right foot weakness; memory issues, black spot right visual field since" (03/23/2015)  . Thrombophlebitis   . Trochanteric bursitis of both hips 05/25/2016  . Unilateral primary osteoarthritis, right knee 05/29/2016  . Urinary urgency 04/19/2016  . Vein disorder 11/29/2010    Family History  Problem Relation Age of Onset  . Heart disease Father   . Cancer Father   . Parkinson's disease Father   . Cancer Mother        skin cancer   . Myasthenia gravis Mother   . Heart disease Brother   . Cancer Brother   . Diabetes Brother   . Stroke Brother   . Heart disease Sister   . Heart attack Sister   . Cancer Maternal Grandfather   . Hypothyroidism Daughter   . Hypertension Other   . Colon cancer Neg Hx    Past Surgical History:  Procedure Laterality Date  . BIOPSY  11/14/2018   Procedure: BIOPSY;  Surgeon: Rogene Houston, MD;  [facility];  Service: Endoscopy;;  esophagus gastric  . BONE EXCISION Right 08/29/2017   Procedure: right trapezium excision;  Surgeon: Daryll Brod, MD;  Location: [place];  Service: Orthopedics;  Laterality: Right;  . BUNIONECTOMY Right 08/2015  . CARDIAC CATHETERIZATION     2008.  "it was fine" (not sure why she had it done, and doesn't know where)  . CARPOMETACARPEL SUSPENSION PLASTY Right 08/29/2017   Procedure: SUSPENSION PLASTY RIGHT THUMB;  Surgeon: Daryll Brod, MD;  Location: [place];  Service: Orthopedics;  Laterality: Right;  . COLONOSCOPY N/A 03/25/2013   Procedure: COLONOSCOPY;  Surgeon: Rogene Houston, MD;  [facility];  Service: Endoscopy;  Laterality: N/A;  930  . ESOPHAGOGASTRODUODENOSCOPY    . ESOPHAGOGASTRODUODENOSCOPY (EGD) WITH  PROPOFOL N/A 11/14/2018   Procedure: ESOPHAGOGASTRODUODENOSCOPY (EGD) WITH PROPOFOL;  Surgeon: Rogene Houston, MD;  [facility];  Service: Endoscopy;  Laterality: N/A;  1:55pm-office moved to 11:00am/pt notified to arrive at 9:30am per KF  . EXPLORATORY LAPAROTOMY     "took fallopian tubes out"  . JOINT REPLACEMENT     bil knee   . KNEE ARTHROPLASTY    . KNEE ARTHROSCOPY Left   . KNEE ARTHROSCOPY W/ ACL RECONSTRUCTION Right    "added pins"  . LAPAROSCOPIC CHOLECYSTECTOMY  ~ 2001  . 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 SPINAL CORD STIMULATOR INSERTION N/A 04/18/2017   Procedure: LUMBAR SPINAL CORD STIMULATOR INSERTION;  Surgeon: Clydell Hakim, MD;  Location: [place];  Service: Neurosurgery;  Laterality: N/A;  LUMBAR SPINAL CORD STIMULATOR INSERTION  . TENDON TRANSFER Right 08/29/2017   Procedure: right abductor pollicis longus transfer;  Surgeon: Daryll Brod,  MD;  Location: [place];  Service: Orthopedics;  Laterality: Right;  . TOTAL KNEE ARTHROPLASTY Left 03/23/2015   Procedure: TOTAL KNEE ARTHROPLASTY;  Surgeon: Newt Minion, MD;  Location: [place];  Service: Orthopedics;  Laterality: Left;  . TOTAL KNEE ARTHROPLASTY Right 08/15/2016   Procedure: RIGHT TOTAL KNEE ARTHROPLASTY, REMOVAL ACL SCREWS;  Surgeon: Newt Minion, MD;  Location: [place];  Service: Orthopedics;  Laterality: Right;  . TOTAL KNEE ARTHROPLASTY WITH HARDWARE REMOVAL Right   . VAGINAL HYSTERECTOMY    . VARICOSE VEIN SURGERY Right X 2   Social History   Social History Narrative   Lives with husband   Caffeine use: No soda   Mainly water, drinks decaf tea   Right handed   Immunization History  Administered Date(s) Administered  . Influenza Split 11/01/2011  . Influenza-Unspecified 08/23/2014     Objective: Vital Signs: There were no vitals taken for this visit.   Physical Exam   Musculoskeletal Exam: ***  CDAI Exam: CDAI Score: - Patient Global: -; Provider Global: - Swollen:  -; Tender: - Joint Exam   No joint exam has been documented for this visit   There is currently no information documented on the homunculus. Go to the Rheumatology activity and complete the homunculus joint exam.  Investigation: No additional findings.  Imaging: No results found.  Recent Labs: Lab Results  Component Value Date   WBC 5.6 10/14/2018   HGB 13.4 10/14/2018   PLT 215 10/14/2018   NA 141 10/14/2018   K 4.5 10/14/2018   CL 108 10/14/2018   CO2 25 10/14/2018   GLUCOSE 99 10/14/2018   BUN 15 10/14/2018   CREATININE 0.92 10/14/2018   BILITOT 0.3 10/27/2018   ALKPHOS 127 (H) 07/26/2017   AST 32 10/27/2018   ALT 170 (H) 10/27/2018   PROT 6.6 10/27/2018   ALBUMIN 3.5 07/26/2017   CALCIUM 9.7 10/14/2018   GFRAA 81 10/14/2018    Speciality Comments: No specialty comments available.  Procedures:  No procedures performed Allergies: Lyrica [pregabalin], Belsomra [suvorexant], Morphine and related, Sulfamethoxazole-trimethoprim, and Tape   Assessment / Plan:     Visit Diagnoses: No diagnosis found.  Orders: No orders of the defined types were placed in this encounter.  No orders of the defined types were placed in this encounter.   Face-to-face time spent with patient was *** minutes. Greater than 50% of time was spent in counseling and coordination of care.  Follow-Up Instructions: No follow-ups on file.   Ofilia Neas, PA-C  Note - This record has been created using Dragon software.  Chart creation errors have been sought, but may not always  have been located. Such creation errors do not reflect on  the standard of medical care.

## 2018-12-01 NOTE — Progress Notes (Deleted)
Office Visit Note  Patient: Debbie Pineda             Date of Birth: 14-Sep-1960           MRN: [id number]             PCP: Jake Samples, PA-C Referring: Jake Samples, [place]* Visit Date: 12/08/2018 Occupation: @GUAROCC @  Subjective:  No chief complaint on file.   History of Present Illness: Debbie Pineda is a 59 y.o. female ***   Activities of Daily Living:  Patient reports morning stiffness for *** {minute/hour:19697}.   Patient {ACTIONS;DENIES/REPORTS:21021675::"Denies"} nocturnal pain.  Difficulty dressing/grooming: {ACTIONS;DENIES/REPORTS:21021675::"Denies"} Difficulty climbing stairs: {ACTIONS;DENIES/REPORTS:21021675::"Denies"} Difficulty getting out of chair: {ACTIONS;DENIES/REPORTS:21021675::"Denies"} Difficulty using hands for taps, buttons, cutlery, and/or writing: {ACTIONS;DENIES/REPORTS:21021675::"Denies"}  No Rheumatology ROS completed.   PMFS History:  Patient Active Problem List   Diagnosis Date Noted  . Migraine 11/11/2018  . Abdominal pain, epigastric 10/14/2018  . LUQ pain 10/14/2018  . Attention deficit hyperactivity disorder (ADHD) 01/12/2018  . Insomnia 01/12/2018  . Elevated liver enzymes 09/10/2017  . History of diabetes mellitus 06/06/2017  . Primary osteoarthritis of right hand 06/06/2017  . Transaminasemia   . TIA (transient ischemic attack) 05/01/2017  . Dysphasia 05/01/2017  . Chronic pain syndrome 05/01/2017  . Confusion   . Presence of right artificial knee joint 09/17/2016  . H/O total knee replacement, right 08/15/2016  . Presence of retained hardware   . Memory disorder 08/14/2016  . Cerebrovascular disease 08/14/2016  . Unilateral primary osteoarthritis, right knee 05/29/2016  . Primary osteoarthritis of both feet 05/29/2016  . Trochanteric bursitis of both hips 05/25/2016  . Neck pain 05/25/2016  . Urinary urgency 04/19/2016  . Chronic low back pain 01/11/2016  . Depression 11/29/2015  . Total knee replacement  status 03/23/2015  . Heart palpitations 12/14/2013  . Generalized osteoarthritis of multiple sites 11/04/2013  . Cerebral infarction ([place]) 10/30/2011  . PFO (patent foramen ovale) 10/30/2011  . Bradycardia 10/30/2011  . Chest pain 10/30/2011  . Hypothyroid   . Thrombophlebitis   . Sleep apnea   . Fibromyalgia   . Status post bariatric surgery 12/07/2010  . Vein disorder 11/29/2010  . S/P total hysterectomy and bilateral salpingo-oophorectomy 11/29/2010  . S/P exploratory laparotomy 11/29/2010  . S/P cholecystectomy 11/29/2010  . S/P ACL surgery 11/29/2010  . Morbid obesity ([place]) 11/10/2010    Past Medical History:  Diagnosis Date  . ADD (attention deficit disorder)    takes Adderall daily  . Arthritis    "all over"  . Cerebral infarction ([place]) 10/30/2011  . Cerebrovascular disease 08/14/2016  . Chronic back pain    "all over"  . Chronic low back pain 01/11/2016  . Complication of anesthesia    tends to have hypotension when NPO and post-anesthesia  . Constipation    takes stool softener daily  . Degenerative disk disease   . Degenerative joint disease   . Depression    takes Cymbalta daily for pain per pt  . Diabetes mellitus without complication ([place])   . DVT (deep venous thrombosis) (HCC)    RLE  . Family history of adverse reaction to anesthesia    a family member woke up during surgery; "think it was my mom"  . Fibromyalgia   . Generalized osteoarthritis of multiple sites 11/04/2013  . GERD (gastroesophageal reflux disease)   . Heart palpitations 12/14/2013  . History of blood clots    superficial  . Hypoglycemia   . Hypothyroid  takes Synthroid daily  . Incomplete emptying of bladder   . Insomnia    takes Trazodone nightly  . Iron deficiency anemia    takes Ferrous Sulfate daily  . Joint pain   . Joint swelling    knees and ankles  . Memory disorder 08/14/2016  . Morbid obesity ([place])   . Nausea    takes Zofran as needed.Seeing GI doc  . Neck pain  05/25/2016  . OSA on CPAP    tested more than 5 yrs ago.    . Osteoarthritis   . PFO (patent foramen ovale)   . Primary osteoarthritis of both feet 05/29/2016   Right bunionectomy August 2017 by Dr. Sharol Given  . Scoliosis   . Sleep apnea   . Stroke [facility]) "several"   right foot weakness; memory issues, black spot right visual field since" (03/23/2015)  . Thrombophlebitis   . Trochanteric bursitis of both hips 05/25/2016  . Unilateral primary osteoarthritis, right knee 05/29/2016  . Urinary urgency 04/19/2016  . Vein disorder 11/29/2010    Family History  Problem Relation Age of Onset  . Heart disease Father   . Cancer Father   . Parkinson's disease Father   . Cancer Mother        skin cancer   . Myasthenia gravis Mother   . Heart disease Brother   . Cancer Brother   . Diabetes Brother   . Stroke Brother   . Heart disease Sister   . Heart attack Sister   . Cancer Maternal Grandfather   . Hypothyroidism Daughter   . Hypertension Other   . Colon cancer Neg Hx    Past Surgical History:  Procedure Laterality Date  . BIOPSY  11/14/2018   Procedure: BIOPSY;  Surgeon: Rogene Houston, MD;  [facility];  Service: Endoscopy;;  esophagus gastric  . BONE EXCISION Right 08/29/2017   Procedure: right trapezium excision;  Surgeon: Daryll Brod, MD;  Location: [place];  Service: Orthopedics;  Laterality: Right;  . BUNIONECTOMY Right 08/2015  . CARDIAC CATHETERIZATION     2008.  "it was fine" (not sure why she had it done, and doesn't know where)  . CARPOMETACARPEL SUSPENSION PLASTY Right 08/29/2017   Procedure: SUSPENSION PLASTY RIGHT THUMB;  Surgeon: Daryll Brod, MD;  Location: [place];  Service: Orthopedics;  Laterality: Right;  . COLONOSCOPY N/A 03/25/2013   Procedure: COLONOSCOPY;  Surgeon: Rogene Houston, MD;  [facility];  Service: Endoscopy;  Laterality: N/A;  930  . ESOPHAGOGASTRODUODENOSCOPY    . ESOPHAGOGASTRODUODENOSCOPY (EGD) WITH  PROPOFOL N/A 11/14/2018   Procedure: ESOPHAGOGASTRODUODENOSCOPY (EGD) WITH PROPOFOL;  Surgeon: Rogene Houston, MD;  [facility];  Service: Endoscopy;  Laterality: N/A;  1:55pm-office moved to 11:00am/pt notified to arrive at 9:30am per KF  . EXPLORATORY LAPAROTOMY     "took fallopian tubes out"  . JOINT REPLACEMENT     bil knee   . KNEE ARTHROPLASTY    . KNEE ARTHROSCOPY Left   . KNEE ARTHROSCOPY W/ ACL RECONSTRUCTION Right    "added pins"  . LAPAROSCOPIC CHOLECYSTECTOMY  ~ 2001  . 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 SPINAL CORD STIMULATOR INSERTION N/A 04/18/2017   Procedure: LUMBAR SPINAL CORD STIMULATOR INSERTION;  Surgeon: Clydell Hakim, MD;  Location: [place];  Service: Neurosurgery;  Laterality: N/A;  LUMBAR SPINAL CORD STIMULATOR INSERTION  . TENDON TRANSFER Right 08/29/2017   Procedure: right abductor pollicis longus transfer;  Surgeon: Daryll Brod,  MD;  Location: [place];  Service: Orthopedics;  Laterality: Right;  . TOTAL KNEE ARTHROPLASTY Left 03/23/2015   Procedure: TOTAL KNEE ARTHROPLASTY;  Surgeon: Newt Minion, MD;  Location: [place];  Service: Orthopedics;  Laterality: Left;  . TOTAL KNEE ARTHROPLASTY Right 08/15/2016   Procedure: RIGHT TOTAL KNEE ARTHROPLASTY, REMOVAL ACL SCREWS;  Surgeon: Newt Minion, MD;  Location: [place];  Service: Orthopedics;  Laterality: Right;  . TOTAL KNEE ARTHROPLASTY WITH HARDWARE REMOVAL Right   . VAGINAL HYSTERECTOMY    . VARICOSE VEIN SURGERY Right X 2   Social History   Social History Narrative   Lives with husband   Caffeine use: No soda   Mainly water, drinks decaf tea   Right handed   Immunization History  Administered Date(s) Administered  . Influenza Split 11/01/2011  . Influenza-Unspecified 08/23/2014     Objective: Vital Signs: There were no vitals taken for this visit.   Physical Exam   Musculoskeletal Exam: ***  CDAI Exam: CDAI Score: - Patient Global: -; Provider Global: - Swollen:  -; Tender: - Joint Exam   No joint exam has been documented for this visit   There is currently no information documented on the homunculus. Go to the Rheumatology activity and complete the homunculus joint exam.  Investigation: No additional findings.  Imaging: No results found.  Recent Labs: Lab Results  Component Value Date   WBC 5.6 10/14/2018   HGB 13.4 10/14/2018   PLT 215 10/14/2018   NA 141 10/14/2018   K 4.5 10/14/2018   CL 108 10/14/2018   CO2 25 10/14/2018   GLUCOSE 99 10/14/2018   BUN 15 10/14/2018   CREATININE 0.92 10/14/2018   BILITOT 0.3 10/27/2018   ALKPHOS 127 (H) 07/26/2017   AST 32 10/27/2018   ALT 170 (H) 10/27/2018   PROT 6.6 10/27/2018   ALBUMIN 3.5 07/26/2017   CALCIUM 9.7 10/14/2018   GFRAA 81 10/14/2018    Speciality Comments: No specialty comments available.  Procedures:  No procedures performed Allergies: Lyrica [pregabalin], Belsomra [suvorexant], Morphine and related, Sulfamethoxazole-trimethoprim, and Tape   Assessment / Plan:     Visit Diagnoses: No diagnosis found.  Orders: No orders of the defined types were placed in this encounter.  No orders of the defined types were placed in this encounter.   Face-to-face time spent with patient was *** minutes. Greater than 50% of time was spent in counseling and coordination of care.  Follow-Up Instructions: No follow-ups on file.   Ofilia Neas, PA-C  Note - This record has been created using Dragon software.  Chart creation errors have been sought, but may not always  have been located. Such creation errors do not reflect on  the standard of medical care.

## 2018-12-02 NOTE — Progress Notes (Signed)
Office Visit Note  Patient: Debbie Pineda             Date of Birth: 09-Jan-1961           MRN: [id number]             PCP: Jake Samples, PA-C Referring: Jake Samples, [place]* Visit Date: 12/05/2018 Occupation: @GUAROCC @  Subjective:  Pain in multiple joints.   History of Present Illness: MARLIN NAKAO is a 58 y.o. female with history of fibromyalgia and osteoarthritis.  She states she continues to have pain and discomfort in her hands, bilateral knee joints, bilateral feet and her bilateral hips.  She also has discomfort in her neck and lower back due to underlying disc disease.  She denies any joint swelling.  Has been experiencing dry mouth and dry eyes.  She also complains of muscle tenderness.  She suffers from chronic insomnia for which she takes medication.  Activities of Daily Living:  Patient reports morning stiffness for 1 hour.   Patient Reports nocturnal pain.  Difficulty dressing/grooming: Reports Difficulty climbing stairs: Reports Difficulty getting out of chair: Reports Difficulty using hands for taps, buttons, cutlery, and/or writing: Denies  Review of Systems  Constitutional: Positive for fatigue. Negative for night sweats, weight gain and weight loss.  HENT: Positive for mouth dryness. Negative for mouth sores, trouble swallowing, trouble swallowing and nose dryness.   Eyes: Positive for dryness. Negative for pain, redness and visual disturbance.  Respiratory: Negative for cough, shortness of breath and difficulty breathing.   Cardiovascular: Positive for swelling in legs/feet. Negative for chest pain, palpitations, hypertension and irregular heartbeat.  Gastrointestinal: Negative for blood in stool, constipation and diarrhea.  Endocrine: Negative for cold intolerance and increased urination.  Genitourinary: Negative for difficulty urinating, involuntary urination and vaginal dryness.  Musculoskeletal: Positive for arthralgias, gait problem, joint  pain, joint swelling, morning stiffness and muscle tenderness. Negative for myalgias, muscle weakness and myalgias.  Skin: Negative for color change, rash, hair loss, skin tightness, ulcers and sensitivity to sunlight.  Allergic/Immunologic: Negative for susceptible to infections.  Neurological: Positive for headaches. Negative for dizziness, light-headedness, numbness, memory loss, night sweats and weakness.  Hematological: Negative for bruising/bleeding tendency and swollen glands.  Psychiatric/Behavioral: Positive for sleep disturbance. Negative for depressed mood. The patient is not nervous/anxious.     PMFS History:  Patient Active Problem List   Diagnosis Date Noted  . Migraine 11/11/2018  . Abdominal pain, epigastric 10/14/2018  . LUQ pain 10/14/2018  . Attention deficit hyperactivity disorder (ADHD) 01/12/2018  . Insomnia 01/12/2018  . Elevated liver enzymes 09/10/2017  . History of diabetes mellitus 06/06/2017  . Primary osteoarthritis of right hand 06/06/2017  . Transaminasemia   . TIA (transient ischemic attack) 05/01/2017  . Dysphasia 05/01/2017  . Chronic pain syndrome 05/01/2017  . Confusion   . Presence of right artificial knee joint 09/17/2016  . H/O total knee replacement, right 08/15/2016  . Presence of retained hardware   . Memory disorder 08/14/2016  . Cerebrovascular disease 08/14/2016  . Unilateral primary osteoarthritis, right knee 05/29/2016  . Primary osteoarthritis of both feet 05/29/2016  . Trochanteric bursitis of both hips 05/25/2016  . Neck pain 05/25/2016  . Urinary urgency 04/19/2016  . Chronic low back pain 01/11/2016  . Depression 11/29/2015  . 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 Heart palpitations 12/14/2013  . Generalized osteoarthritis of multiple sites 11/04/2013  . Cerebral infarction ([place]) 10/30/2011  . PFO (patent foramen ovale) 10/30/2011  .  Bradycardia 10/30/2011  . Chest pain 10/30/2011  . Hypothyroid   . Thrombophlebitis    . Sleep apnea   . Fibromyalgia   . Status post bariatric surgery 12/07/2010  . Vein disorder 11/29/2010  . S/P total hysterectomy and bilateral salpingo-oophorectomy 11/29/2010  . S/P exploratory laparotomy 11/29/2010  . S/P cholecystectomy 11/29/2010  . S/P ACL surgery 11/29/2010  . Morbid obesity ([place]) 11/10/2010    Past Medical History:  Diagnosis Date  . ADD (attention deficit disorder)    takes Adderall daily  . Arthritis    "all over"  . Cerebral infarction ([place]) 10/30/2011  . Cerebrovascular disease 08/14/2016  . Chronic back pain    "all over"  . Chronic low back pain 01/11/2016  . Complication of anesthesia    tends to have hypotension when NPO and post-anesthesia  . Constipation    takes stool softener daily  . Degenerative disk disease   . Degenerative joint disease   . Depression    takes Cymbalta daily for pain per pt  . Diabetes mellitus without complication ([place])   . DVT (deep venous thrombosis) ([facility])    RLE  . Family history of adverse reaction to anesthesia    a family member woke up during surgery; "think it was my mom"  . Fibromyalgia   . Generalized osteoarthritis of multiple sites 11/04/2013  . GERD (gastroesophageal reflux disease)   . Heart palpitations 12/14/2013  . History of blood clots    superficial  . Hypoglycemia   . Hypothyroid    takes Synthroid daily  . Incomplete emptying of bladder   . Insomnia    takes Trazodone nightly  . Iron deficiency anemia    takes Ferrous Sulfate daily  . Joint pain   . Joint swelling    knees and ankles  . Memory disorder 08/14/2016  . Morbid obesity ([place])   . Nausea    takes Zofran as needed.Seeing GI doc  . Neck pain 05/25/2016  . OSA on CPAP    tested more than 5 yrs ago.    . Osteoarthritis   . PFO (patent foramen ovale)   . Primary osteoarthritis of both feet 05/29/2016   Right bunionectomy August 2017 by Dr. Sharol Given  . Scoliosis   . Sleep apnea   . Stroke [facility]) "several"   right foot weakness;  memory issues, black spot right visual field since" (03/23/2015)  . Thrombophlebitis   . Trochanteric bursitis of both hips 05/25/2016  . Unilateral primary osteoarthritis, right knee 05/29/2016  . Urinary urgency 04/19/2016  . Vein disorder 11/29/2010    Family History  Problem Relation Age of Onset  . Heart disease Father   . Cancer Father   . Parkinson's disease Father   . Cancer Mother        skin cancer   . Myasthenia gravis Mother   . Heart disease Brother   . Cancer Brother   . Diabetes Brother   . Stroke Brother   . Heart disease Sister   . Heart attack Sister   . Cancer Maternal Grandfather   . Hypothyroidism Daughter   . Hypertension Other   . Colon cancer Neg Hx    Past Surgical History:  Procedure Laterality Date  . BIOPSY  11/14/2018   Procedure: BIOPSY;  Surgeon: Rogene Houston, MD;  [facility];  Service: Endoscopy;;  esophagus gastric  . BONE EXCISION Right 08/29/2017   Procedure: right trapezium excision;  Surgeon: Daryll Brod, MD;  Location: [facility]  [place];  Service: Orthopedics;  Laterality: Right;  . BUNIONECTOMY Right 08/2015  . CARDIAC CATHETERIZATION     2008.  "it was fine" (not sure why she had it done, and doesn't know where)  . CARPOMETACARPEL SUSPENSION PLASTY Right 08/29/2017   Procedure: SUSPENSION PLASTY RIGHT THUMB;  Surgeon: Daryll Brod, MD;  Location: [place];  Service: Orthopedics;  Laterality: Right;  . COLONOSCOPY N/A 03/25/2013   Procedure: COLONOSCOPY;  Surgeon: Rogene Houston, MD;  [facility];  Service: Endoscopy;  Laterality: N/A;  930  . ESOPHAGOGASTRODUODENOSCOPY    . ESOPHAGOGASTRODUODENOSCOPY (EGD) WITH PROPOFOL N/A 11/14/2018   Procedure: ESOPHAGOGASTRODUODENOSCOPY (EGD) WITH PROPOFOL;  Surgeon: Rogene Houston, MD;  [facility];  Service: Endoscopy;  Laterality: N/A;  1:55pm-office moved to 11:00am/pt notified to arrive at 9:30am per KF  . EXPLORATORY LAPAROTOMY     "took  fallopian tubes out"  . JOINT REPLACEMENT     bil knee   . KNEE ARTHROPLASTY    . KNEE ARTHROSCOPY Left   . KNEE ARTHROSCOPY W/ ACL RECONSTRUCTION Right    "added pins"  . LAPAROSCOPIC CHOLECYSTECTOMY  ~ 2001  . 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 SPINAL CORD STIMULATOR INSERTION N/A 04/18/2017   Procedure: LUMBAR SPINAL CORD STIMULATOR INSERTION;  Surgeon: Clydell Hakim, MD;  Location: [place];  Service: Neurosurgery;  Laterality: N/A;  LUMBAR SPINAL CORD STIMULATOR INSERTION  . TENDON TRANSFER Right 08/29/2017   Procedure: right abductor pollicis longus transfer;  Surgeon: Daryll Brod, MD;  Location: [place];  Service: Orthopedics;  Laterality: Right;  . TOTAL KNEE ARTHROPLASTY Left 03/23/2015   Procedure: TOTAL KNEE ARTHROPLASTY;  Surgeon: Newt Minion, MD;  Location: [place];  Service: Orthopedics;  Laterality: Left;  . TOTAL KNEE ARTHROPLASTY Right 08/15/2016   Procedure: RIGHT TOTAL KNEE ARTHROPLASTY, REMOVAL ACL SCREWS;  Surgeon: Newt Minion, MD;  Location: [place];  Service: Orthopedics;  Laterality: Right;  . TOTAL KNEE ARTHROPLASTY WITH HARDWARE REMOVAL Right   . VAGINAL HYSTERECTOMY    . VARICOSE VEIN SURGERY Right X 2   Social History   Social History Narrative   Lives with husband   Caffeine use: No soda   Mainly water, drinks decaf tea   Right handed   Immunization History  Administered Date(s) Administered  . Influenza Split 11/01/2011  . Influenza-Unspecified 08/23/2014     Objective: Vital Signs: BP 98/69 (BP Location: Left Arm, Patient Position: Sitting, Cuff Size: Normal)   Pulse 66   Resp 18   Ht 5' 4.5" (1.638 m)   Wt 210 lb (95.3 kg)   BMI 35.49 kg/m    Physical Exam Vitals signs and nursing note reviewed.  Constitutional:      Appearance: She is well-developed.  HENT:     Head: Normocephalic and atraumatic.  Eyes:     Conjunctiva/sclera: Conjunctivae normal.  Neck:     Musculoskeletal: Normal range of motion.  Cardiovascular:      Rate and Rhythm: Normal rate and regular rhythm.     Heart sounds: Normal heart sounds.  Pulmonary:     Effort: Pulmonary effort is normal.     Breath sounds: Normal breath sounds.  Abdominal:     General: Bowel sounds are normal.     Palpations: Abdomen is soft.  Lymphadenopathy:     Cervical: No cervical adenopathy.  Skin:    General: Skin is warm and dry.     Capillary Refill: Capillary  refill takes less than 2 seconds.  Neurological:     Mental Status: She is alert and oriented to person, place, and time.  Psychiatric:        Behavior: Behavior normal.      Musculoskeletal Exam: Patient had painful range of motion of her cervical and lumbar spine.  Shoulder joints, elbow joints, wrist joints with good range of motion.  She has no synovitis over MCPs PIPs or DIPs.  Mild DIP prominence was noted.  Hip joints were in good range of motion.  She had tenderness on palpation of bilateral trochanteric bursa.  Her knee joints were in good range of motion.  Ankle joints are in good range of motion.  She has hypermobility in almost all of her joints.  She has positive tender points and hyperalgesia.  CDAI Exam: CDAI Score: - Patient Global: -; Provider Global: - Swollen: -; Tender: - Joint Exam   No joint exam has been documented for this visit   There is currently no information documented on the homunculus. Go to the Rheumatology activity and complete the homunculus joint exam.  Investigation: No additional findings.  Imaging: No results found.  Recent Labs: Lab Results  Component Value Date   WBC 5.6 10/14/2018   HGB 13.4 10/14/2018   PLT 215 10/14/2018   NA 141 10/14/2018   K 4.5 10/14/2018   CL 108 10/14/2018   CO2 25 10/14/2018   GLUCOSE 99 10/14/2018   BUN 15 10/14/2018   CREATININE 0.92 10/14/2018   BILITOT 0.3 10/27/2018   ALKPHOS 127 (H) 07/26/2017   AST 32 10/27/2018   ALT 170 (H) 10/27/2018   PROT 6.6 10/27/2018   ALBUMIN 3.5 07/26/2017   CALCIUM 9.7  10/14/2018   GFRAA 81 10/14/2018    Speciality Comments: No specialty comments available.  Procedures:  Large Joint Inj: bilateral greater trochanter on 12/05/2018 12:07 PM Indications: pain Details: 27 G 1.5 in needle, lateral approach  Arthrogram: No  Medications (Right): 1.5 mL lidocaine 1 %; 40 mg triamcinolone acetonide 40 MG/ML Aspirate (Right): 0 mL Medications (Left): 1.5 mL lidocaine 1 %; 40 mg triamcinolone acetonide 40 MG/ML Aspirate (Left): 0 mL Outcome: tolerated well, no immediate complications Procedure, treatment alternatives, risks and benefits explained, specific risks discussed. Consent was given by the patient. Immediately prior to procedure a time out was called to verify the correct patient, procedure, equipment, support staff and site/side marked as required. Patient was prepped and draped in the usual sterile fashion.     Allergies: Lyrica [pregabalin], Belsomra [suvorexant], Morphine and related, Sulfamethoxazole-trimethoprim, and Tape   Assessment / Plan:     Visit Diagnoses: Fibromyalgia-she is having a flare with generalized pain and discomfort.  She has hyperalgesia and positive tender points.  Primary osteoarthritis of right hand-joint protection was discussed.  Trochanteric bursitis of both hips-she has pain and discomfort over bilateral trochanteric bursa.  She is requesting a cortisone injection.  After side effects were discussed bilateral trochanteric bursae were injected with cortisone as described above.  She complains of sicca symptoms most likely due to the medication usage.  History of total bilateral knee replacement-she continues to have chronic pain in her knees.  Primary osteoarthritis of both feet-she states that she has over supination of her feet.  Proper fitting shoes with orthotics were discussed.  DDD (degenerative disc disease), thoracic-she has chronic pain.  DDD (degenerative disc disease), lumbar-she continues to have lower  back pain.  I have given her a handout on back exercises.  History of thrombophlebitis  History of diabetes mellitus-she states her blood sugars are very well maintained.  Have advised her to monitor blood sugar closely after the cortisone injection.  She states she has had cortisone injection in the past without any problems.  History of cerebral infarction  History of gastric bypass  History of sleep apnea  PFO (patent foramen ovale)  Orders: Orders Placed This Encounter  Procedures  . Large Joint Inj   No orders of the defined types were placed in this encounter.     Follow-Up Instructions: Return in about 6 months (around 06/04/2019) for Osteoarthritis.   Bo Merino, MD  Note - This record has been created using Editor, commissioning.  Chart creation errors have been sought, but may not always  have been located. Such creation errors do not reflect on  the standard of medical care.

## 2018-12-04 ENCOUNTER — Other Ambulatory Visit (INDEPENDENT_AMBULATORY_CARE_PROVIDER_SITE_OTHER): Payer: Self-pay | Admitting: *Deleted

## 2018-12-04 DIAGNOSIS — R1011 Right upper quadrant pain: Secondary | ICD-10-CM

## 2018-12-04 DIAGNOSIS — R748 Abnormal levels of other serum enzymes: Secondary | ICD-10-CM

## 2018-12-05 ENCOUNTER — Other Ambulatory Visit: Payer: Self-pay

## 2018-12-05 ENCOUNTER — Encounter: Payer: Self-pay | Admitting: Physician Assistant

## 2018-12-05 ENCOUNTER — Ambulatory Visit: Payer: PPO | Admitting: Rheumatology

## 2018-12-05 VITALS — BP 98/69 | HR 66 | Resp 18 | Ht 64.5 in | Wt 210.0 lb

## 2018-12-05 DIAGNOSIS — Z9884 Bariatric surgery status: Secondary | ICD-10-CM

## 2018-12-05 DIAGNOSIS — M5136 Other intervertebral disc degeneration, lumbar region: Secondary | ICD-10-CM

## 2018-12-05 DIAGNOSIS — Z8673 Personal history of transient ischemic attack (TIA), and cerebral infarction without residual deficits: Secondary | ICD-10-CM

## 2018-12-05 DIAGNOSIS — Z8672 Personal history of thrombophlebitis: Secondary | ICD-10-CM

## 2018-12-05 DIAGNOSIS — M5134 Other intervertebral disc degeneration, thoracic region: Secondary | ICD-10-CM | POA: Diagnosis not present

## 2018-12-05 DIAGNOSIS — M7061 Trochanteric bursitis, right hip: Secondary | ICD-10-CM

## 2018-12-05 DIAGNOSIS — Z8639 Personal history of other endocrine, nutritional and metabolic disease: Secondary | ICD-10-CM | POA: Diagnosis not present

## 2018-12-05 DIAGNOSIS — Z96653 Presence of artificial knee joint, bilateral: Secondary | ICD-10-CM

## 2018-12-05 DIAGNOSIS — Q2112 Patent foramen ovale: Secondary | ICD-10-CM

## 2018-12-05 DIAGNOSIS — M797 Fibromyalgia: Secondary | ICD-10-CM | POA: Diagnosis not present

## 2018-12-05 DIAGNOSIS — Z8669 Personal history of other diseases of the nervous system and sense organs: Secondary | ICD-10-CM

## 2018-12-05 DIAGNOSIS — M19041 Primary osteoarthritis, right hand: Secondary | ICD-10-CM | POA: Diagnosis not present

## 2018-12-05 DIAGNOSIS — M7062 Trochanteric bursitis, left hip: Secondary | ICD-10-CM

## 2018-12-05 DIAGNOSIS — M19071 Primary osteoarthritis, right ankle and foot: Secondary | ICD-10-CM | POA: Diagnosis not present

## 2018-12-05 DIAGNOSIS — M19072 Primary osteoarthritis, left ankle and foot: Secondary | ICD-10-CM

## 2018-12-05 DIAGNOSIS — Q211 Atrial septal defect: Secondary | ICD-10-CM

## 2018-12-05 MED ORDER — TRIAMCINOLONE ACETONIDE 40 MG/ML IJ SUSP
40.0000 mg | INTRAMUSCULAR | Status: AC | PRN
  Administered 2018-12-05: 40 mg via INTRA_ARTICULAR

## 2018-12-05 MED ORDER — LIDOCAINE HCL 1 % IJ SOLN
1.5000 mL | INTRAMUSCULAR | Status: AC | PRN
  Administered 2018-12-05: 1.5 mL

## 2018-12-05 NOTE — Patient Instructions (Signed)
Iliotibial Band Syndrome Rehab Ask your health care provider which exercises are safe for you. Do exercises exactly as told by your health care provider and adjust them as directed. It is normal to feel mild stretching, pulling, tightness, or discomfort as you do these exercises. Stop right away if you feel sudden pain or your pain gets significantly worse. Do not begin these exercises until told by your health care provider. Stretching and range-of-motion exercises These exercises warm up your muscles and joints and improve the movement and flexibility of your hip and pelvis. Quadriceps stretch, prone  1. Lie on your abdomen on a firm surface, such as a bed or padded floor (prone position). 2. Bend your left / right knee and reach back to hold your ankle or pant leg. If you cannot reach your ankle or pant leg, loop a belt around your foot and grab the belt instead. 3. Gently pull your heel toward your buttocks. Your knee should not slide out to the side. You should feel a stretch in the front of your thigh and knee (quadriceps). 4. Hold this position for __________ seconds. Repeat __________ times. Complete this exercise __________ times a day. Iliotibial band stretch An iliotibial band is a strong band of muscle tissue that runs from the outer side of your hip to the outer side of your thigh and knee. 1. Lie on your side with your left / right leg in the top position. 2. Bend both of your knees and grab your left / right ankle. Stretch out your bottom arm to help you balance. 3. Slowly bring your top knee back so your thigh goes behind your trunk. 4. Slowly lower your top leg toward the floor until you feel a gentle stretch on the outside of your left / right hip and thigh. If you do not feel a stretch and your knee will not fall farther, place the heel of your other foot on top of your knee and pull your knee down toward the floor with your foot. 5. Hold this position for __________ seconds.  Repeat __________ times. Complete this exercise __________ times a day. Strengthening exercises These exercises build strength and endurance in your hip and pelvis. Endurance is the ability to use your muscles for a long time, even after they get tired. Straight leg raises, side-lying This exercise strengthens the muscles that rotate the leg at the hip and move it away from your body (hip abductors). 1. Lie on your side with your left / right leg in the top position. Lie so your head, shoulder, hip, and knee line up. You may bend your bottom knee to help you balance. 2. Roll your hips slightly forward so your hips are stacked directly over each other and your left / right knee is facing forward. 3. Tense the muscles in your outer thigh and lift your top leg 4-6 inches (10-15 cm). 4. Hold this position for __________ seconds. 5. Slowly return to the starting position. Let your muscles relax completely before doing another repetition. Repeat __________ times. Complete this exercise __________ times a day. Leg raises, prone This exercise strengthens the muscles that move the hips (hip extensors). 1. Lie on your abdomen on your bed or a firm surface. You can put a pillow under your hips if that is more comfortable for your lower back. 2. Bend your left / right knee so your foot is straight up in the air. 3. Squeeze your buttocks muscles and lift your left / right thigh   off the bed. Do not let your back arch. 4. Tense your thigh muscle as hard as you can without increasing any knee pain. 5. Hold this position for __________ seconds. 6. Slowly lower your leg to the starting position and allow it to relax completely. Repeat __________ times. Complete this exercise __________ times a day. Hip hike 1. Stand sideways on a bottom step. Stand on your left / right leg with your other foot unsupported next to the step. You can hold on to the railing or wall for balance if needed. 2. Keep your knees straight  and your torso square. Then lift your left / right hip up toward the ceiling. 3. Slowly let your left / right hip lower toward the floor, past the starting position. Your foot should get closer to the floor. Do not lean or bend your knees. Repeat __________ times. Complete this exercise __________ times a day. This information is not intended to replace advice given to you by your health care provider. Make sure you discuss any questions you have with your health care provider. Document Released: 01/08/2005 Document Revised: 05/01/2018 Document Reviewed: 10/30/2017 Elsevier Patient Education  2020 [place]. Cervical Strain and Sprain Rehab Ask your health care provider which exercises are safe for you. Do exercises exactly as told by your health care provider and adjust them as directed. It is normal to feel mild stretching, pulling, tightness, or discomfort as you do these exercises. Stop right away if you feel sudden pain or your pain gets worse. Do not begin these exercises until told by your health care provider. Stretching and range-of-motion exercises Cervical side bending  1. Using good posture, sit on a stable chair or stand up. 2. Without moving your shoulders, slowly tilt your left / right ear to your shoulder until you feel a stretch in the opposite side neck muscles. You should be looking straight ahead. 3. Hold for __________ seconds. 4. Repeat with the other side of your neck. Repeat __________ times. Complete this exercise __________ times a day. Cervical rotation  1. Using good posture, sit on a stable chair or stand up. 2. Slowly turn your head to the side as if you are looking over your left / right shoulder. ? Keep your eyes level with the ground. ? Stop when you feel a stretch along the side and the back of your neck. 3. Hold for __________ seconds. 4. Repeat this by turning to your other side. Repeat __________ times. Complete this exercise __________ times a day.  Thoracic extension and pectoral stretch 1. Roll a towel or a small blanket so it is about 4 inches (10 cm) in diameter. 2. Lie down on your back on a firm surface. 3. Put the towel lengthwise, under your spine in the middle of your back. It should not be under your shoulder blades. The towel should line up with your spine from your middle back to your lower back. 4. Put your hands behind your head and let your elbows fall out to your sides. 5. Hold for __________ seconds. Repeat __________ times. Complete this exercise __________ times a day. Strengthening exercises Isometric upper cervical flexion 1. Lie on your back with a thin pillow behind your head and a small rolled-up towel under your neck. 2. Gently tuck your chin toward your chest and nod your head down to look toward your feet. Do not lift your head off the pillow. 3. Hold for __________ seconds. 4. Release the tension slowly. Relax your neck muscles  completely before you repeat this exercise. Repeat __________ times. Complete this exercise __________ times a day. Isometric cervical extension  1. Stand about 6 inches (15 cm) away from a wall, with your back facing the wall. 2. Place a soft object, about 6-8 inches (15-20 cm) in diameter, between the back of your head and the wall. A soft object could be a small pillow, a ball, or a folded towel. 3. Gently tilt your head back and press into the soft object. Keep your jaw and forehead relaxed. 4. Hold for __________ seconds. 5. Release the tension slowly. Relax your neck muscles completely before you repeat this exercise. Repeat __________ times. Complete this exercise __________ times a day. Posture and body mechanics Body mechanics refers to the movements and positions of your body while you do your daily activities. Posture is part of body mechanics. Good posture and healthy body mechanics can help to relieve stress in your body's tissues and joints. Good posture means that your  spine is in its natural S-curve position (your spine is neutral), your shoulders are pulled back slightly, and your head is not tipped forward. The following are general guidelines for applying improved posture and body mechanics to your everyday activities. Sitting  1. When sitting, keep your spine neutral and keep your feet flat on the floor. Use a footrest, if necessary, and keep your thighs parallel to the floor. Avoid rounding your shoulders, and avoid tilting your head forward. 2. When working at a desk or a computer, keep your desk at a height where your hands are slightly lower than your elbows. Slide your chair under your desk so you are close enough to maintain good posture. 3. When working at a computer, place your monitor at a height where you are looking straight ahead and you do not have to tilt your head forward or downward to look at the screen. Standing   When standing, keep your spine neutral and keep your feet about hip-width apart. Keep a slight bend in your knees. Your ears, shoulders, and hips should line up.  When you do a task in which you stand in one place for a long time, place one foot up on a stable object that is 2-4 inches (5-10 cm) high, such as a footstool. This helps keep your spine neutral. Resting When lying down and resting, avoid positions that are most painful for you. Try to support your neck in a neutral position. You can use a contour pillow or a small rolled-up towel. Your pillow should support your neck but not push on it. This information is not intended to replace advice given to you by your health care provider. Make sure you discuss any questions you have with your health care provider. Document Released: 01/08/2005 Document Revised: 04/30/2018 Document Reviewed: 10/09/2017 Elsevier Patient Education  2020 [place]. Back Exercises The following exercises strengthen the muscles that help to support the trunk and back. They also help to keep the  lower back flexible. Doing these exercises can help to prevent back pain or lessen existing pain.  If you have back pain or discomfort, try doing these exercises 2-3 times each day or as told by your health care provider.  As your pain improves, do them once each day, but increase the number of times that you repeat the steps for each exercise (do more repetitions).  To prevent the recurrence of back pain, continue to do these exercises once each day or as told by your health care  provider. Do exercises exactly as told by your health care provider and adjust them as directed. It is normal to feel mild stretching, pulling, tightness, or discomfort as you do these exercises, but you should stop right away if you feel sudden pain or your pain gets worse. Exercises Single knee to chest Repeat these steps 3-5 times for each leg: 1. Lie on your back on a firm bed or the floor with your legs extended. 2. Bring one knee to your chest. Your other leg should stay extended and in contact with the floor. 3. Hold your knee in place by grabbing your knee or thigh with both hands and hold. 4. Pull on your knee until you feel a gentle stretch in your lower back or buttocks. 5. Hold the stretch for 10-30 seconds. 6. Slowly release and straighten your leg. Pelvic tilt Repeat these steps 5-10 times: 1. Lie on your back on a firm bed or the floor with your legs extended. 2. Bend your knees so they are pointing toward the ceiling and your feet are flat on the floor. 3. Tighten your lower abdominal muscles to press your lower back against the floor. This motion will tilt your pelvis so your tailbone points up toward the ceiling instead of pointing to your feet or the floor. 4. With gentle tension and even breathing, hold this position for 5-10 seconds. Cat-cow Repeat these steps until your lower back becomes more flexible: 1. Get into a hands-and-knees position on a firm surface. Keep your hands under your  shoulders, and keep your knees under your hips. You may place padding under your knees for comfort. 2. Let your head hang down toward your chest. Contract your abdominal muscles and point your tailbone toward the floor so your lower back becomes rounded like the back of a cat. 3. Hold this position for 5 seconds. 4. Slowly lift your head, let your abdominal muscles relax and point your tailbone up toward the ceiling so your back forms a sagging arch like the back of a cow. 5. Hold this position for 5 seconds.  Press-ups Repeat these steps 5-10 times: 1. Lie on your abdomen (face-down) on the floor. 2. Place your palms near your head, about shoulder-width apart. 3. Keeping your back as relaxed as possible and keeping your hips on the floor, slowly straighten your arms to raise the top half of your body and lift your shoulders. Do not use your back muscles to raise your upper torso. You may adjust the placement of your hands to make yourself more comfortable. 4. Hold this position for 5 seconds while you keep your back relaxed. 5. Slowly return to lying flat on the floor.  Bridges Repeat these steps 10 times: 1. Lie on your back on a firm surface. 2. Bend your knees so they are pointing toward the ceiling and your feet are flat on the floor. Your arms should be flat at your sides, next to your body. 3. Tighten your buttocks muscles and lift your buttocks off the floor until your waist is at almost the same height as your knees. You should feel the muscles working in your buttocks and the back of your thighs. If you do not feel these muscles, slide your feet 1-2 inches farther away from your buttocks. 4. Hold this position for 3-5 seconds. 5. Slowly lower your hips to the starting position, and allow your buttocks muscles to relax completely. If this exercise is too easy, try doing it with your arms crossed  over your chest. Abdominal crunches Repeat these steps 5-10 times: 1. Lie on your back on  a firm bed or the floor with your legs extended. 2. Bend your knees so they are pointing toward the ceiling and your feet are flat on the floor. 3. Cross your arms over your chest. 4. Tip your chin slightly toward your chest without bending your neck. 5. Tighten your abdominal muscles and slowly raise your trunk (torso) high enough to lift your shoulder blades a tiny bit off the floor. Avoid raising your torso higher than that because it can put too much stress on your low back and does not help to strengthen your abdominal muscles. 6. Slowly return to your starting position. Back lifts Repeat these steps 5-10 times: 1. Lie on your abdomen (face-down) with your arms at your sides, and rest your forehead on the floor. 2. Tighten the muscles in your legs and your buttocks. 3. Slowly lift your chest off the floor while you keep your hips pressed to the floor. Keep the back of your head in line with the curve in your back. Your eyes should be looking at the floor. 4. Hold this position for 3-5 seconds. 5. Slowly return to your starting position. Contact a health care provider if:  Your back pain or discomfort gets much worse when you do an exercise.  Your worsening back pain or discomfort does not lessen within 2 hours after you exercise. If you have any of these problems, stop doing these exercises right away. Do not do them again unless your health care provider says that you can. Get help right away if:  You develop sudden, severe back pain. If this happens, stop doing the exercises right away. Do not do them again unless your health care provider says that you can. This information is not intended to replace advice given to you by your health care provider. Make sure you discuss any questions you have with your health care provider. Document Released: 02/16/2004 Document Revised: 05/15/2018 Document Reviewed: 10/10/2017 Elsevier Patient Education  2020 [place]. Back Exercises The  following exercises strengthen the muscles that help to support the trunk and back. They also help to keep the lower back flexible. Doing these exercises can help to prevent back pain or lessen existing pain.  If you have back pain or discomfort, try doing these exercises 2-3 times each day or as told by your health care provider.  As your pain improves, do them once each day, but increase the number of times that you repeat the steps for each exercise (do more repetitions).  To prevent the recurrence of back pain, continue to do these exercises once each day or as told by your health care provider. Do exercises exactly as told by your health care provider and adjust them as directed. It is normal to feel mild stretching, pulling, tightness, or discomfort as you do these exercises, but you should stop right away if you feel sudden pain or your pain gets worse. Exercises Single knee to chest Repeat these steps 3-5 times for each leg: 7. Lie on your back on a firm bed or the floor with your legs extended. 8. Bring one knee to your chest. Your other leg should stay extended and in contact with the floor. 9. Hold your knee in place by grabbing your knee or thigh with both hands and hold. 10. Pull on your knee until you feel a gentle stretch in your lower back or buttocks. 11. Hold  the stretch for 10-30 seconds. 12. Slowly release and straighten your leg. Pelvic tilt Repeat these steps 5-10 times: 5. Lie on your back on a firm bed or the floor with your legs extended. 6. Bend your knees so they are pointing toward the ceiling and your feet are flat on the floor. 7. Tighten your lower abdominal muscles to press your lower back against the floor. This motion will tilt your pelvis so your tailbone points up toward the ceiling instead of pointing to your feet or the floor. 8. With gentle tension and even breathing, hold this position for 5-10 seconds. Cat-cow Repeat these steps until your lower back  becomes more flexible: 6. Get into a hands-and-knees position on a firm surface. Keep your hands under your shoulders, and keep your knees under your hips. You may place padding under your knees for comfort. 7. Let your head hang down toward your chest. Contract your abdominal muscles and point your tailbone toward the floor so your lower back becomes rounded like the back of a cat. 8. Hold this position for 5 seconds. 9. Slowly lift your head, let your abdominal muscles relax and point your tailbone up toward the ceiling so your back forms a sagging arch like the back of a cow. 10. Hold this position for 5 seconds.  Press-ups Repeat these steps 5-10 times: 6. Lie on your abdomen (face-down) on the floor. 7. Place your palms near your head, about shoulder-width apart. 8. Keeping your back as relaxed as possible and keeping your hips on the floor, slowly straighten your arms to raise the top half of your body and lift your shoulders. Do not use your back muscles to raise your upper torso. You may adjust the placement of your hands to make yourself more comfortable. 9. Hold this position for 5 seconds while you keep your back relaxed. 10. Slowly return to lying flat on the floor.  Bridges Repeat these steps 10 times: 6. Lie on your back on a firm surface. 7. Bend your knees so they are pointing toward the ceiling and your feet are flat on the floor. Your arms should be flat at your sides, next to your body. 8. Tighten your buttocks muscles and lift your buttocks off the floor until your waist is at almost the same height as your knees. You should feel the muscles working in your buttocks and the back of your thighs. If you do not feel these muscles, slide your feet 1-2 inches farther away from your buttocks. 9. Hold this position for 3-5 seconds. 10. Slowly lower your hips to the starting position, and allow your buttocks muscles to relax completely. If this exercise is too easy, try doing it  with your arms crossed over your chest. Abdominal crunches Repeat these steps 5-10 times: 7. Lie on your back on a firm bed or the floor with your legs extended. 8. Bend your knees so they are pointing toward the ceiling and your feet are flat on the floor. 9. Cross your arms over your chest. 10. Tip your chin slightly toward your chest without bending your neck. 31. Tighten your abdominal muscles and slowly raise your trunk (torso) high enough to lift your shoulder blades a tiny bit off the floor. Avoid raising your torso higher than that because it can put too much stress on your low back and does not help to strengthen your abdominal muscles. 12. Slowly return to your starting position. Back lifts Repeat these steps 5-10 times: 6. Shanda Howells  on your abdomen (face-down) with your arms at your sides, and rest your forehead on the floor. 7. Tighten the muscles in your legs and your buttocks. 8. Slowly lift your chest off the floor while you keep your hips pressed to the floor. Keep the back of your head in line with the curve in your back. Your eyes should be looking at the floor. 9. Hold this position for 3-5 seconds. 10. Slowly return to your starting position. Contact a health care provider if:  Your back pain or discomfort gets much worse when you do an exercise.  Your worsening back pain or discomfort does not lessen within 2 hours after you exercise. If you have any of these problems, stop doing these exercises right away. Do not do them again unless your health care provider says that you can. Get help right away if:  You develop sudden, severe back pain. If this happens, stop doing the exercises right away. Do not do them again unless your health care provider says that you can. This information is not intended to replace advice given to you by your health care provider. Make sure you discuss any questions you have with your health care provider. Document Released: 02/16/2004 Document  Revised: 05/15/2018 Document Reviewed: 10/10/2017 Elsevier Patient Education  2020 [employer].

## 2018-12-08 ENCOUNTER — Ambulatory Visit: Payer: PPO | Admitting: Rheumatology

## 2018-12-10 DIAGNOSIS — R030 Elevated blood-pressure reading, without diagnosis of hypertension: Secondary | ICD-10-CM | POA: Diagnosis not present

## 2018-12-10 DIAGNOSIS — M47816 Spondylosis without myelopathy or radiculopathy, lumbar region: Secondary | ICD-10-CM | POA: Diagnosis not present

## 2018-12-10 DIAGNOSIS — Z9689 Presence of other specified functional implants: Secondary | ICD-10-CM | POA: Diagnosis not present

## 2018-12-10 DIAGNOSIS — M545 Low back pain: Secondary | ICD-10-CM | POA: Diagnosis not present

## 2018-12-11 ENCOUNTER — Ambulatory Visit: Payer: PPO | Admitting: Rheumatology

## 2018-12-12 DIAGNOSIS — H52223 Regular astigmatism, bilateral: Secondary | ICD-10-CM | POA: Diagnosis not present

## 2018-12-12 DIAGNOSIS — H259 Unspecified age-related cataract: Secondary | ICD-10-CM | POA: Diagnosis not present

## 2018-12-12 DIAGNOSIS — H04123 Dry eye syndrome of bilateral lacrimal glands: Secondary | ICD-10-CM | POA: Diagnosis not present

## 2018-12-12 DIAGNOSIS — H5213 Myopia, bilateral: Secondary | ICD-10-CM | POA: Diagnosis not present

## 2018-12-12 DIAGNOSIS — H524 Presbyopia: Secondary | ICD-10-CM | POA: Diagnosis not present

## 2018-12-12 DIAGNOSIS — Z135 Encounter for screening for eye and ear disorders: Secondary | ICD-10-CM | POA: Diagnosis not present

## 2018-12-16 DIAGNOSIS — R748 Abnormal levels of other serum enzymes: Secondary | ICD-10-CM | POA: Diagnosis not present

## 2018-12-17 LAB — HEPATIC FUNCTION PANEL
AG Ratio: 2 (calc) (ref 1.0–2.5)
ALT: 121 U/L — ABNORMAL HIGH (ref 6–29)
AST: 29 U/L (ref 10–35)
Albumin: 4.4 g/dL (ref 3.6–5.1)
Alkaline phosphatase (APISO): 117 U/L (ref 37–153)
Bilirubin, Direct: 0.1 mg/dL (ref 0.0–0.2)
Globulin: 2.2 g/dL (calc) (ref 1.9–3.7)
Indirect Bilirubin: 0.3 mg/dL (calc) (ref 0.2–1.2)
Total Bilirubin: 0.4 mg/dL (ref 0.2–1.2)
Total Protein: 6.6 g/dL (ref 6.1–8.1)

## 2018-12-24 ENCOUNTER — Telehealth: Payer: Self-pay | Admitting: Cardiovascular Disease

## 2018-12-24 ENCOUNTER — Other Ambulatory Visit (INDEPENDENT_AMBULATORY_CARE_PROVIDER_SITE_OTHER): Payer: Self-pay | Admitting: *Deleted

## 2018-12-24 DIAGNOSIS — R7401 Elevation of levels of liver transaminase levels: Secondary | ICD-10-CM

## 2018-12-24 DIAGNOSIS — M545 Low back pain: Secondary | ICD-10-CM | POA: Diagnosis not present

## 2018-12-24 DIAGNOSIS — Z9689 Presence of other specified functional implants: Secondary | ICD-10-CM | POA: Diagnosis not present

## 2018-12-24 NOTE — Telephone Encounter (Signed)
Called pt. No answer. Unable to leave message.  

## 2018-12-24 NOTE — Telephone Encounter (Signed)
Pt left voicemail wanting to know if she should hold her Xarelto tonight since she'e having a tooth extracted tomorrow?   Please call [phone]

## 2018-12-26 NOTE — Telephone Encounter (Signed)
Left message to call back-cc 

## 2019-01-01 DIAGNOSIS — N952 Postmenopausal atrophic vaginitis: Secondary | ICD-10-CM | POA: Diagnosis not present

## 2019-01-01 DIAGNOSIS — N941 Unspecified dyspareunia: Secondary | ICD-10-CM | POA: Diagnosis not present

## 2019-01-01 DIAGNOSIS — F5231 Female orgasmic disorder: Secondary | ICD-10-CM | POA: Diagnosis not present

## 2019-01-01 DIAGNOSIS — R3915 Urgency of urination: Secondary | ICD-10-CM | POA: Diagnosis not present

## 2019-01-01 DIAGNOSIS — N3941 Urge incontinence: Secondary | ICD-10-CM | POA: Diagnosis not present

## 2019-01-04 ENCOUNTER — Other Ambulatory Visit (INDEPENDENT_AMBULATORY_CARE_PROVIDER_SITE_OTHER): Payer: Self-pay | Admitting: Nurse Practitioner

## 2019-01-05 ENCOUNTER — Other Ambulatory Visit (INDEPENDENT_AMBULATORY_CARE_PROVIDER_SITE_OTHER): Payer: Self-pay | Admitting: *Deleted

## 2019-01-05 DIAGNOSIS — R7401 Elevation of levels of liver transaminase levels: Secondary | ICD-10-CM

## 2019-01-05 DIAGNOSIS — R1011 Right upper quadrant pain: Secondary | ICD-10-CM

## 2019-01-05 DIAGNOSIS — R748 Abnormal levels of other serum enzymes: Secondary | ICD-10-CM

## 2019-01-12 ENCOUNTER — Other Ambulatory Visit: Payer: PPO

## 2019-01-20 ENCOUNTER — Telehealth: Payer: Self-pay | Admitting: Physician Assistant

## 2019-01-20 ENCOUNTER — Other Ambulatory Visit: Payer: Self-pay

## 2019-01-20 NOTE — Telephone Encounter (Signed)
Patient called and said that she needs a refill on her concerta 27 mg to be sent to the [facility] in [place]

## 2019-01-21 MED ORDER — METHYLPHENIDATE HCL ER (OSM) 27 MG PO TBCR: 27.0000 mg | EXTENDED_RELEASE_TABLET | Freq: Two times a day (BID) | ORAL | 0 refills | Status: DC

## 2019-01-21 NOTE — Telephone Encounter (Signed)
I'm sending in 1 Rx of Concerta.  Please let her know she needs to make an appt w/ me for any more Rxs from me.  At the [facility], we talked about her having her PCP write the Concerta.  So if she's going to get it from her, fine, but if from me, she needs to be seen.  Thanks

## 2019-01-26 ENCOUNTER — Telehealth: Payer: Self-pay

## 2019-01-26 NOTE — Telephone Encounter (Signed)
Received a fax for a renewal PA for pts Aimovig 140mg /mL auto-injectors on CMM. Key: [id number]  "[facility] will respond automatically with your next steps."-CMM "Available without authorization"-CMM

## 2019-01-28 DIAGNOSIS — G4733 Obstructive sleep apnea (adult) (pediatric): Secondary | ICD-10-CM | POA: Diagnosis not present

## 2019-01-29 DIAGNOSIS — Z5181 Encounter for therapeutic drug level monitoring: Secondary | ICD-10-CM | POA: Diagnosis not present

## 2019-01-29 DIAGNOSIS — M792 Neuralgia and neuritis, unspecified: Secondary | ICD-10-CM | POA: Diagnosis not present

## 2019-01-29 DIAGNOSIS — M159 Polyosteoarthritis, unspecified: Secondary | ICD-10-CM | POA: Diagnosis not present

## 2019-01-29 DIAGNOSIS — M5136 Other intervertebral disc degeneration, lumbar region: Secondary | ICD-10-CM | POA: Diagnosis not present

## 2019-02-03 DIAGNOSIS — R7401 Elevation of levels of liver transaminase levels: Secondary | ICD-10-CM | POA: Diagnosis not present

## 2019-02-03 DIAGNOSIS — R1011 Right upper quadrant pain: Secondary | ICD-10-CM | POA: Diagnosis not present

## 2019-02-03 DIAGNOSIS — R748 Abnormal levels of other serum enzymes: Secondary | ICD-10-CM | POA: Diagnosis not present

## 2019-02-04 LAB — HEPATIC FUNCTION PANEL
AG Ratio: 1.8 (calc) (ref 1.0–2.5)
ALT: 142 U/L — ABNORMAL HIGH (ref 6–29)
AST: 100 U/L — ABNORMAL HIGH (ref 10–35)
Albumin: 4.1 g/dL (ref 3.6–5.1)
Alkaline phosphatase (APISO): 103 U/L (ref 37–153)
Bilirubin, Direct: 0.1 mg/dL (ref 0.0–0.2)
Globulin: 2.3 g/dL (calc) (ref 1.9–3.7)
Indirect Bilirubin: 0.3 mg/dL (calc) (ref 0.2–1.2)
Total Bilirubin: 0.4 mg/dL (ref 0.2–1.2)
Total Protein: 6.4 g/dL (ref 6.1–8.1)

## 2019-02-04 LAB — GAMMA GT: GGT: 139 U/L — ABNORMAL HIGH (ref 3–70)

## 2019-02-04 NOTE — Telephone Encounter (Signed)
PT is trying to transfer her care to the PCP so I didn't make her an appt with you.

## 2019-02-04 NOTE — Telephone Encounter (Signed)
Patient states that you said you would contact her primary care provider,  Delman Cheadle, [place], [facility] in [place]. She has been waiting on that.

## 2019-02-05 NOTE — Telephone Encounter (Signed)
Debbie Pineda, please call pt and let her know I did send a note to her PCP at [facility].  I never heard back from her.  Have her call and make an appt w/ Samantha at [facility] to discuss her Rx the stimulant.  Thanks.

## 2019-02-05 NOTE — Telephone Encounter (Signed)
Pt. Made aware.

## 2019-02-06 ENCOUNTER — Ambulatory Visit (INDEPENDENT_AMBULATORY_CARE_PROVIDER_SITE_OTHER): Payer: PPO | Admitting: Specialist

## 2019-02-06 ENCOUNTER — Encounter: Payer: Self-pay | Admitting: Specialist

## 2019-02-06 ENCOUNTER — Other Ambulatory Visit: Payer: Self-pay

## 2019-02-06 VITALS — BP 112/62 | HR 62 | Ht 65.0 in | Wt 212.0 lb

## 2019-02-06 DIAGNOSIS — M4156 Other secondary scoliosis, lumbar region: Secondary | ICD-10-CM | POA: Diagnosis not present

## 2019-02-06 DIAGNOSIS — M4726 Other spondylosis with radiculopathy, lumbar region: Secondary | ICD-10-CM | POA: Diagnosis not present

## 2019-02-06 DIAGNOSIS — M5136 Other intervertebral disc degeneration, lumbar region: Secondary | ICD-10-CM | POA: Diagnosis not present

## 2019-02-06 DIAGNOSIS — M4805 Spinal stenosis, thoracolumbar region: Secondary | ICD-10-CM | POA: Diagnosis not present

## 2019-02-06 DIAGNOSIS — M51369 Other intervertebral disc degeneration, lumbar region without mention of lumbar back pain or lower extremity pain: Secondary | ICD-10-CM

## 2019-02-06 NOTE — Patient Instructions (Addendum)
Avoid bending, stooping and avoid lifting weights greater than 10 lbs. Avoid prolong standing and walking. Avoid frequent bending and stooping  No lifting greater than 10 lbs. May use ice or moist heat for pain. Weight loss is of benefit. Handicap license is approved. Check with primary care above over taking Vitamin D, daily requirements are 800 IU per day, Calcium is 1500 mg per day And we obtain dietary elemental calcium of about 200 mg per serving of a dairy product or Calcium fortified food item.

## 2019-02-06 NOTE — Progress Notes (Signed)
Office Visit Note   Patient: Debbie Pineda           Date of Birth: 10-08-1960           MRN: [id number] Visit Date: 02/06/2019              Requested by: Jake Samples, PA-C [address] [place],  [place] [address] PCP: Jake Samples, PA-C   Assessment & Plan: Visit Diagnoses:  1. Other spondylosis with radiculopathy, lumbar region   2. Spinal stenosis of thoracolumbar region   3. Lumbar degenerative disc disease   4. Other secondary scoliosis, lumbar region     Plan: Avoid bending, stooping and avoid lifting weights greater than 10 lbs. Avoid prolong standing and walking. Avoid frequent bending and stooping  No lifting greater than 10 lbs. May use ice or moist heat for pain. Weight loss is of benefit. Handicap license is approved. Check with primary care above over taking Vitamin D, daily requirements are 800 IU per day, Calcium is 1500 mg per day And we obtain dietary elemental calcium of about 200 mg per serving of a dairy product or Calcium fortified food item. Follow-Up Instructions: Return in about 6 months (around 08/06/2019).   Orders:  No orders of the defined types were placed in this encounter.  No orders of the defined types were placed in this encounter.     Procedures: No procedures performed   Clinical Data: No additional findings.   Subjective: Chief Complaint  Patient presents with  . Lower Back - Follow-up    59 year old female with history of thoracolumbar scoliosis and spinal stenosis and spondylosis. She has had evaluation for lumbar fusion by myself and her findings suggest mainly arthritis changes. She has pain following the right TKR, pain that is  Treated with intermittant nuycenta and cymbalta and is currently living with pain due to thoracolumbar scoliosis and advanced ddd and spondylosis. She is taking Vitamin D and    Review of Systems  Constitutional: Negative.   HENT: Negative.   Eyes: Negative.    Respiratory: Negative.   Cardiovascular: Negative.   Gastrointestinal: Negative.   Endocrine: Negative.   Genitourinary: Negative.   Musculoskeletal: Negative.   Skin: Negative.   Allergic/Immunologic: Negative.   Neurological: Negative.   Hematological: Negative.   Psychiatric/Behavioral: Negative.      Objective: Vital Signs: BP 112/62 (BP Location: Left Arm, Patient Position: Sitting)   Pulse 62   Ht 5\' 5"  (1.651 m)   Wt 212 lb (96.2 kg)   BMI 35.28 kg/m   Physical Exam Constitutional:      Appearance: She is well-developed.  HENT:     Head: Normocephalic and atraumatic.  Eyes:     Pupils: Pupils are equal, round, and reactive to light.  Pulmonary:     Effort: Pulmonary effort is normal.     Breath sounds: Normal breath sounds.  Abdominal:     General: Bowel sounds are normal.     Palpations: Abdomen is soft.  Musculoskeletal:     Cervical back: Normal range of motion and neck supple.     Lumbar back: Negative right straight leg raise test and negative left straight leg raise test.  Skin:    General: Skin is warm and dry.  Neurological:     Mental Status: She is alert and oriented to person, place, and time.  Psychiatric:        Behavior: Behavior normal.  Thought Content: Thought content normal.        Judgment: Judgment normal.     Back Exam   Tenderness  The patient is experiencing tenderness in the lumbar.  Range of Motion  Extension: abnormal  Flexion: abnormal  Lateral bend right: abnormal  Lateral bend left: abnormal  Rotation right: abnormal  Rotation left: abnormal   Muscle Strength  Right Quadriceps:  5/5  Left Quadriceps:  5/5  Right Hamstrings:  5/5  Left Hamstrings:  5/5   Tests  Straight leg raise right: negative Straight leg raise left: negative  Reflexes  Patellar: 0/4 Achilles: 0/4 Babinski's sign: normal   Other  Toe walk: normal Heel walk: normal Sensation: normal Gait: normal  Erythema: no back redness  Scars: absent      Specialty Comments:  No specialty comments available.  Imaging: No results found.   PMFS History: Patient Active Problem List   Diagnosis Date Noted  . Migraine 11/11/2018  . Abdominal pain, epigastric 10/14/2018  . LUQ pain 10/14/2018  . Attention deficit hyperactivity disorder (ADHD) 01/12/2018  . Insomnia 01/12/2018  . Elevated liver enzymes 09/10/2017  . History of diabetes mellitus 06/06/2017  . Primary osteoarthritis of right hand 06/06/2017  . Transaminasemia   . TIA (transient ischemic attack) 05/01/2017  . Dysphasia 05/01/2017  . Chronic pain syndrome 05/01/2017  . Confusion   . Presence of right artificial knee joint 09/17/2016  . H/O total knee replacement, right 08/15/2016  . Presence of retained hardware   . Memory disorder 08/14/2016  . Cerebrovascular disease 08/14/2016  . Unilateral primary osteoarthritis, right knee 05/29/2016  . Primary osteoarthritis of both feet 05/29/2016  . Trochanteric bursitis of both hips 05/25/2016  . Neck pain 05/25/2016  . Urinary urgency 04/19/2016  . Chronic low back pain 01/11/2016  . Depression 11/29/2015  . 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 Heart palpitations 12/14/2013  . Generalized osteoarthritis of multiple sites 11/04/2013  . Cerebral infarction ([place]) 10/30/2011  . PFO (patent foramen ovale) 10/30/2011  . Bradycardia 10/30/2011  . Chest pain 10/30/2011  . Hypothyroid   . Thrombophlebitis   . Sleep apnea   . Fibromyalgia   . Status post bariatric surgery 12/07/2010  . Vein disorder 11/29/2010  . S/P total hysterectomy and bilateral salpingo-oophorectomy 11/29/2010  . S/P exploratory laparotomy 11/29/2010  . S/P cholecystectomy 11/29/2010  . S/P ACL surgery 11/29/2010  . Morbid obesity ([place]) 11/10/2010   Past Medical History:  Diagnosis Date  . ADD (attention deficit disorder)    takes Adderall daily  . Arthritis    "all over"  . Cerebral infarction ([place]) 10/30/2011  .  Cerebrovascular disease 08/14/2016  . Chronic back pain    "all over"  . Chronic low back pain 01/11/2016  . Complication of anesthesia    tends to have hypotension when NPO and post-anesthesia  . Constipation    takes stool softener daily  . Degenerative disk disease   . Degenerative joint disease   . Depression    takes Cymbalta daily for pain per pt  . Diabetes mellitus without complication ([place])   . DVT (deep venous thrombosis) ([facility])    RLE  . Family history of adverse reaction to anesthesia    a family member woke up during surgery; "think it was my mom"  . Fibromyalgia   . Generalized osteoarthritis of multiple sites 11/04/2013  . GERD (gastroesophageal reflux disease)   . Heart palpitations 12/14/2013  . History of blood clots  superficial  . Hypoglycemia   . Hypothyroid    takes Synthroid daily  . Incomplete emptying of bladder   . Insomnia    takes Trazodone nightly  . Iron deficiency anemia    takes Ferrous Sulfate daily  . Joint pain   . Joint swelling    knees and ankles  . Memory disorder 08/14/2016  . Morbid obesity ([place])   . Nausea    takes Zofran as needed.Seeing GI doc  . Neck pain 05/25/2016  . OSA on CPAP    tested more than 5 yrs ago.    . Osteoarthritis   . PFO (patent foramen ovale)   . Primary osteoarthritis of both feet 05/29/2016   Right bunionectomy August 2017 by Dr. Sharol Given  . Scoliosis   . Sleep apnea   . Stroke [facility]) "several"   right foot weakness; memory issues, black spot right visual field since" (03/23/2015)  . Thrombophlebitis   . Trochanteric bursitis of both hips 05/25/2016  . Unilateral primary osteoarthritis, right knee 05/29/2016  . Urinary urgency 04/19/2016  . Vein disorder 11/29/2010    Family History  Problem Relation Age of Onset  . Heart disease Father   . Cancer Father   . Parkinson's disease Father   . Cancer Mother        skin cancer   . Myasthenia gravis Mother   . Heart disease Brother   . Cancer Brother   .  Diabetes Brother   . Stroke Brother   . Heart disease Sister   . Heart attack Sister   . Cancer Maternal Grandfather   . Hypothyroidism Daughter   . Hypertension Other   . Colon cancer Neg Hx     Past Surgical History:  Procedure Laterality Date  . BIOPSY  11/14/2018   Procedure: BIOPSY;  Surgeon: Rogene Houston, MD;  [facility];  Service: Endoscopy;;  esophagus gastric  . BONE EXCISION Right 08/29/2017   Procedure: right trapezium excision;  Surgeon: Daryll Brod, MD;  Location: [facility];  Service: Orthopedics;  Laterality: Right;  . BUNIONECTOMY Right 08/2015  . CARDIAC CATHETERIZATION     2008.  "it was fine" (not sure why she had it done, and doesn't know where)  . CARPOMETACARPEL SUSPENSION PLASTY Right 08/29/2017   Procedure: SUSPENSION PLASTY RIGHT THUMB;  Surgeon: Daryll Brod, MD;  Location: [facility];  Service: Orthopedics;  Laterality: Right;  . COLONOSCOPY N/A 03/25/2013   Procedure: COLONOSCOPY;  Surgeon: Rogene Houston, MD;  [facility];  Service: Endoscopy;  Laterality: N/A;  930  . ESOPHAGOGASTRODUODENOSCOPY    . ESOPHAGOGASTRODUODENOSCOPY (EGD) WITH PROPOFOL N/A 11/14/2018   Procedure: ESOPHAGOGASTRODUODENOSCOPY (EGD) WITH PROPOFOL;  Surgeon: Rogene Houston, MD;  [facility];  Service: Endoscopy;  Laterality: N/A;  1:55pm-office moved to 11:00am/pt notified to arrive at 9:30am per KF  . EXPLORATORY LAPAROTOMY     "took fallopian tubes out"  . JOINT REPLACEMENT     bil knee   . KNEE ARTHROPLASTY    . KNEE ARTHROSCOPY Left   . KNEE ARTHROSCOPY W/ ACL RECONSTRUCTION Right    "added pins"  . LAPAROSCOPIC CHOLECYSTECTOMY  ~ 2001  . 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 SPINAL CORD STIMULATOR INSERTION N/A 04/18/2017   Procedure: LUMBAR SPINAL CORD STIMULATOR INSERTION;  Surgeon: Clydell Hakim, MD;  Location: [facility];  Service: Neurosurgery;  Laterality: N/A;  LUMBAR SPINAL CORD STIMULATOR INSERTION  .  TENDON TRANSFER Right 08/29/2017  Procedure: right abductor pollicis longus transfer;  Surgeon: Daryll Brod, MD;  Location: [facility];  Service: Orthopedics;  Laterality: Right;  . TOTAL KNEE ARTHROPLASTY Left 03/23/2015   Procedure: TOTAL KNEE ARTHROPLASTY;  Surgeon: Newt Minion, MD;  Location: [facility];  Service: Orthopedics;  Laterality: Left;  . TOTAL KNEE ARTHROPLASTY Right 08/15/2016   Procedure: RIGHT TOTAL KNEE ARTHROPLASTY, REMOVAL ACL SCREWS;  Surgeon: Newt Minion, MD;  Location: [place];  Service: Orthopedics;  Laterality: Right;  . TOTAL KNEE ARTHROPLASTY WITH HARDWARE REMOVAL Right   . VAGINAL HYSTERECTOMY    . VARICOSE VEIN SURGERY Right X 2   Social History   Occupational History  . Occupation: Disability  . Occupation: formerly Therapist, sports, [employer]  Tobacco Use  . Smoking status: Former Smoker    Packs/day: 0.75    Years: 8.00    Pack years: 6.00    Types: Cigarettes    Quit date: 12/01/1990    Years since quitting: 28.2  . Smokeless tobacco: Never Used  . Tobacco comment: quit smoking in the 1990s  Substance and Sexual Activity  . Alcohol use: No    Comment: 03/23/2015 "stopped drinking in 2012 w/gastric bypass; drank socially before bypass"  . Drug use: No  . Sexual activity: Not Currently    Birth control/protection: Surgical

## 2019-02-09 ENCOUNTER — Other Ambulatory Visit (INDEPENDENT_AMBULATORY_CARE_PROVIDER_SITE_OTHER): Payer: Self-pay | Admitting: *Deleted

## 2019-02-09 DIAGNOSIS — R1011 Right upper quadrant pain: Secondary | ICD-10-CM

## 2019-02-09 DIAGNOSIS — R748 Abnormal levels of other serum enzymes: Secondary | ICD-10-CM

## 2019-02-09 DIAGNOSIS — R7401 Elevation of levels of liver transaminase levels: Secondary | ICD-10-CM

## 2019-02-16 ENCOUNTER — Other Ambulatory Visit (INDEPENDENT_AMBULATORY_CARE_PROVIDER_SITE_OTHER): Payer: Self-pay | Admitting: *Deleted

## 2019-02-16 DIAGNOSIS — R748 Abnormal levels of other serum enzymes: Secondary | ICD-10-CM

## 2019-02-16 DIAGNOSIS — R1011 Right upper quadrant pain: Secondary | ICD-10-CM

## 2019-02-16 DIAGNOSIS — R7401 Elevation of levels of liver transaminase levels: Secondary | ICD-10-CM

## 2019-02-21 ENCOUNTER — Other Ambulatory Visit (INDEPENDENT_AMBULATORY_CARE_PROVIDER_SITE_OTHER): Payer: Self-pay | Admitting: Nurse Practitioner

## 2019-02-21 NOTE — Telephone Encounter (Signed)
[name] Headings, pls manage rx refill thx

## 2019-02-22 DIAGNOSIS — M797 Fibromyalgia: Secondary | ICD-10-CM | POA: Diagnosis not present

## 2019-02-22 DIAGNOSIS — E063 Autoimmune thyroiditis: Secondary | ICD-10-CM | POA: Diagnosis not present

## 2019-02-22 DIAGNOSIS — M1711 Unilateral primary osteoarthritis, right knee: Secondary | ICD-10-CM | POA: Diagnosis not present

## 2019-02-22 DIAGNOSIS — E7849 Other hyperlipidemia: Secondary | ICD-10-CM | POA: Diagnosis not present

## 2019-02-23 ENCOUNTER — Other Ambulatory Visit (INDEPENDENT_AMBULATORY_CARE_PROVIDER_SITE_OTHER): Payer: Self-pay | Admitting: Gastroenterology

## 2019-02-23 ENCOUNTER — Telehealth: Payer: Self-pay | Admitting: Neurology

## 2019-02-23 DIAGNOSIS — Z5181 Encounter for therapeutic drug level monitoring: Secondary | ICD-10-CM | POA: Diagnosis not present

## 2019-02-23 MED ORDER — OMEPRAZOLE 40 MG PO CPDR: 40.0000 mg | DELAYED_RELEASE_CAPSULE | Freq: Every day | ORAL | 1 refills | Status: DC

## 2019-02-23 NOTE — Telephone Encounter (Signed)
Pt is asking for a call from RN to discuss her request for a 2nd CPAP to keep at her mothers home.

## 2019-02-23 NOTE — Progress Notes (Signed)
Per my chart message refills sent

## 2019-02-24 ENCOUNTER — Other Ambulatory Visit: Payer: Self-pay | Admitting: Neurology

## 2019-02-24 DIAGNOSIS — Z9989 Dependence on other enabling machines and devices: Secondary | ICD-10-CM

## 2019-02-24 DIAGNOSIS — G4733 Obstructive sleep apnea (adult) (pediatric): Secondary | ICD-10-CM

## 2019-02-24 NOTE — Telephone Encounter (Signed)
Pt returned call and understand this is out of pocket expense and would like a script mailed to her home address. I have reprinted and entered the order from October and will send to address on file per patient request.

## 2019-02-24 NOTE — Telephone Encounter (Signed)
Called the patient back and there was no answer. LVM for the patient informing her that we could send a order for a 2nd CPAP that is not a problem. Insurance will not cover another CPAP though as they only cover a CPAP every 5 yrs, her current machine was set up in 2018. Informed in the VM for the patient to call us back if she would like for [place] to send the order to the DME she is established with. Also advised the patient that she could reach out to her DME company to get details on the cost of the machine out of pocket. Informed the patient to call back to advise how to move forward.

## 2019-03-09 ENCOUNTER — Other Ambulatory Visit (INDEPENDENT_AMBULATORY_CARE_PROVIDER_SITE_OTHER): Payer: Self-pay | Admitting: Internal Medicine

## 2019-03-09 ENCOUNTER — Other Ambulatory Visit: Payer: Self-pay

## 2019-03-09 ENCOUNTER — Other Ambulatory Visit (HOSPITAL_COMMUNITY)
Admission: RE | Admit: 2019-03-09 | Discharge: 2019-03-09 | Disposition: A | Discharge status: Home / Self Care | Payer: PPO | Source: Ambulatory Visit | Attending: Internal Medicine | Admitting: Internal Medicine

## 2019-03-09 ENCOUNTER — Ambulatory Visit (HOSPITAL_COMMUNITY)
Admission: RE | Admit: 2019-03-09 | Discharge: 2019-03-09 | Disposition: A | Discharge status: Home / Self Care | Payer: PPO | Source: Ambulatory Visit | Attending: Internal Medicine | Admitting: Internal Medicine

## 2019-03-09 DIAGNOSIS — K838 Other specified diseases of biliary tract: Secondary | ICD-10-CM | POA: Diagnosis not present

## 2019-03-09 DIAGNOSIS — R748 Abnormal levels of other serum enzymes: Secondary | ICD-10-CM | POA: Diagnosis not present

## 2019-03-09 DIAGNOSIS — R7401 Elevation of levels of liver transaminase levels: Secondary | ICD-10-CM | POA: Diagnosis not present

## 2019-03-09 DIAGNOSIS — R1011 Right upper quadrant pain: Secondary | ICD-10-CM

## 2019-03-09 LAB — HEPATIC FUNCTION PANEL
ALT: 50 U/L — ABNORMAL HIGH (ref 0–44)
AST: 31 U/L (ref 15–41)
Albumin: 3.9 g/dL (ref 3.5–5.0)
Alkaline Phosphatase: 83 U/L (ref 38–126)
Bilirubin, Direct: 0.1 mg/dL (ref 0.0–0.2)
Indirect Bilirubin: 0.5 mg/dL (ref 0.3–0.9)
Total Bilirubin: 0.6 mg/dL (ref 0.3–1.2)
Total Protein: 6.5 g/dL (ref 6.5–8.1)

## 2019-03-09 NOTE — Progress Notes (Signed)
LFT

## 2019-03-13 DIAGNOSIS — E6609 Other obesity due to excess calories: Secondary | ICD-10-CM | POA: Diagnosis not present

## 2019-03-13 DIAGNOSIS — E7849 Other hyperlipidemia: Secondary | ICD-10-CM | POA: Diagnosis not present

## 2019-03-13 DIAGNOSIS — Z1389 Encounter for screening for other disorder: Secondary | ICD-10-CM | POA: Diagnosis not present

## 2019-03-13 DIAGNOSIS — E568 Deficiency of other vitamins: Secondary | ICD-10-CM | POA: Diagnosis not present

## 2019-03-13 DIAGNOSIS — Z6834 Body mass index (BMI) 34.0-34.9, adult: Secondary | ICD-10-CM | POA: Diagnosis not present

## 2019-03-13 DIAGNOSIS — E119 Type 2 diabetes mellitus without complications: Secondary | ICD-10-CM | POA: Diagnosis not present

## 2019-03-13 DIAGNOSIS — Z0001 Encounter for general adult medical examination with abnormal findings: Secondary | ICD-10-CM | POA: Diagnosis not present

## 2019-03-18 ENCOUNTER — Other Ambulatory Visit (HOSPITAL_COMMUNITY): Payer: Self-pay | Admitting: Family Medicine

## 2019-03-18 ENCOUNTER — Other Ambulatory Visit: Payer: Self-pay | Admitting: Neurology

## 2019-03-18 DIAGNOSIS — Z1231 Encounter for screening mammogram for malignant neoplasm of breast: Secondary | ICD-10-CM

## 2019-03-22 DIAGNOSIS — M797 Fibromyalgia: Secondary | ICD-10-CM | POA: Diagnosis not present

## 2019-03-22 DIAGNOSIS — E7849 Other hyperlipidemia: Secondary | ICD-10-CM | POA: Diagnosis not present

## 2019-03-22 DIAGNOSIS — E063 Autoimmune thyroiditis: Secondary | ICD-10-CM | POA: Diagnosis not present

## 2019-03-22 DIAGNOSIS — M1711 Unilateral primary osteoarthritis, right knee: Secondary | ICD-10-CM | POA: Diagnosis not present

## 2019-03-30 ENCOUNTER — Ambulatory Visit (HOSPITAL_COMMUNITY)
Admission: RE | Admit: 2019-03-30 | Discharge: 2019-03-30 | Disposition: A | Discharge status: Home / Self Care | Payer: PPO | Source: Ambulatory Visit | Attending: Family Medicine | Admitting: Family Medicine

## 2019-03-30 ENCOUNTER — Other Ambulatory Visit: Payer: Self-pay

## 2019-03-30 DIAGNOSIS — Z1231 Encounter for screening mammogram for malignant neoplasm of breast: Secondary | ICD-10-CM | POA: Insufficient documentation

## 2019-04-03 ENCOUNTER — Encounter: Payer: Self-pay | Admitting: Neurology

## 2019-04-03 DIAGNOSIS — H02403 Unspecified ptosis of bilateral eyelids: Secondary | ICD-10-CM | POA: Diagnosis not present

## 2019-04-03 DIAGNOSIS — E119 Type 2 diabetes mellitus without complications: Secondary | ICD-10-CM | POA: Diagnosis not present

## 2019-04-06 MED ORDER — TOPIRAMATE 100 MG PO TABS: 100.0000 mg | ORAL_TABLET | Freq: Two times a day (BID) | ORAL | 5 refills | Status: DC

## 2019-04-16 ENCOUNTER — Ambulatory Visit: Payer: Self-pay

## 2019-04-16 ENCOUNTER — Ambulatory Visit (INDEPENDENT_AMBULATORY_CARE_PROVIDER_SITE_OTHER): Payer: PPO

## 2019-04-16 ENCOUNTER — Other Ambulatory Visit: Payer: Self-pay

## 2019-04-16 ENCOUNTER — Ambulatory Visit: Payer: PPO | Admitting: Specialist

## 2019-04-16 ENCOUNTER — Telehealth: Payer: Self-pay | Admitting: Family Medicine

## 2019-04-16 ENCOUNTER — Encounter: Payer: Self-pay | Admitting: Specialist

## 2019-04-16 VITALS — BP 137/84 | HR 88 | Ht 65.0 in | Wt 207.0 lb

## 2019-04-16 DIAGNOSIS — M4155 Other secondary scoliosis, thoracolumbar region: Secondary | ICD-10-CM | POA: Diagnosis not present

## 2019-04-16 DIAGNOSIS — M7062 Trochanteric bursitis, left hip: Secondary | ICD-10-CM | POA: Diagnosis not present

## 2019-04-16 DIAGNOSIS — M25552 Pain in left hip: Secondary | ICD-10-CM

## 2019-04-16 DIAGNOSIS — M7602 Gluteal tendinitis, left hip: Secondary | ICD-10-CM

## 2019-04-16 DIAGNOSIS — M4805 Spinal stenosis, thoracolumbar region: Secondary | ICD-10-CM | POA: Diagnosis not present

## 2019-04-16 NOTE — Patient Instructions (Signed)
Plan: Ambulate weight bearing as tolerated. Heat or ice to the left hip laterally as needed. Consider PT for stretching and ROM exercises left hip gluteal muscles. Sent to Dr. Junius Roads area for a U/S guided left guteal tendon and bursa injection.

## 2019-04-16 NOTE — Progress Notes (Signed)
Subjective: She is here for ultrasound-guided left greater trochanter injection.  She has not had lasting relief from palpation guided injections.  She points to the greater trochanter as the location of her pain.  Objective: She is point tender on the lateral aspect of the greater trochanter.  Procedure: Ultrasound-guided left greater trochanter injection: After sterile prep with Betadine, injected a total of 8 cc 1% lidocaine without epinephrine and 40 mg methylprednisolone, partly into a calcification at the surface of the greater trochanter and partly into a hypoechoic defect in the gluteus medius tendon overlying the calcification.  She had very good relief during the immediate anesthetic phase.  She will follow-up with Dr. Louanne Skye.

## 2019-04-16 NOTE — Telephone Encounter (Signed)
Pt called in stating Dr. Louanne Skye referred her to Dr. Junius Roads and Dr. Junius Roads discussed sending some pictures to her via email; so she wanted to call and leave her email for him.   Pts email  [email or web address]

## 2019-04-16 NOTE — Progress Notes (Signed)
Office Visit Note   Patient: Debbie Pineda           Date of Birth: [birth year]           MRN: [id number] Visit Date: 04/16/2019              Requested by: Jake Samples, PA-C [address] [place],  [place] [address] PCP: Jake Samples, PA-C   Assessment & Plan: Visit Diagnoses:  1. Pain in left hip   2. Trochanteric bursitis, left hip   3. Gluteal tendonitis of left buttock   4. Spinal stenosis of thoracolumbar region   5. Other secondary scoliosis, thoracolumbar region     Plan: Ambulate weight bearing as tolerated. Heat or ice to the left hip laterally as needed. Consider PT for stretching and ROM exercises left hip gluteal muscles. Sent to Dr. Junius Roads area for a U/S guided left guteal tendon and bursa injection.   Follow-Up Instructions: Return in about 2 weeks (around 04/30/2019) for overbook with Dr. Louanne Skye.   Orders:  Orders Placed This Encounter  Procedures  . XR HIP UNILAT W OR W/O PELVIS 2-3 VIEWS LEFT   No orders of the defined types were placed in this encounter.     Procedures: No procedures performed   Clinical Data: No additional findings.   Subjective: Chief Complaint  Patient presents with  . Left Hip - Pain    59 year old female with left lateral and anterolateral hip pain, history of thoracolumbar scoliosis with left lateral hip pain. Previous  Injections in the office setting only provided temporary relief pain. She feel like this is in the hip itself. No bowel issues but has    Review of Systems  Constitutional: Negative.   HENT: Negative.   Eyes: Negative.   Respiratory: Negative.   Cardiovascular: Negative.   Gastrointestinal: Negative.   Endocrine: Negative.   Genitourinary: Negative.   Musculoskeletal: Negative.   Skin: Negative.   Allergic/Immunologic: Negative.   Neurological: Negative.   Hematological: Negative.   Psychiatric/Behavioral: Negative.      Objective: Vital Signs: BP 137/84 (BP Location:  Left Arm, Patient Position: Sitting)   Pulse 88   Ht 5\' 5"  (1.651 m)   Wt 207 lb (93.9 kg)   BMI 34.45 kg/m   Physical Exam  Ortho Exam  Specialty Comments:  No specialty comments available.  Imaging: XR HIP UNILAT W OR W/O PELVIS 2-3 VIEWS LEFT  Result Date: 04/16/2019 [facility] and Lateral left hip with with the joint line is well maintained with no significant sclerosis and minimal inferomedial osteophyte. The joint line is well maintaines. The left lateral greater trochanter is without sclerosis or osteophyte.     PMFS History: Patient Active Problem List   Diagnosis Date Noted  . Migraine 11/11/2018  . Abdominal pain, epigastric 10/14/2018  . LUQ pain 10/14/2018  . Attention deficit hyperactivity disorder (ADHD) 01/12/2018  . Insomnia 01/12/2018  . Elevated liver enzymes 09/10/2017  . History of diabetes mellitus 06/06/2017  . Primary osteoarthritis of right hand 06/06/2017  . Transaminasemia   . TIA (transient ischemic attack) 05/01/2017  . Dysphasia 05/01/2017  . Chronic pain syndrome 05/01/2017  . Confusion   . Presence of right artificial knee joint 09/17/2016  . H/O total knee replacement, right 08/15/2016  . Presence of retained hardware   . Memory disorder 08/14/2016  . Cerebrovascular disease 08/14/2016  . Unilateral primary osteoarthritis, right knee 05/29/2016  . Primary osteoarthritis of both feet 05/29/2016  .  Trochanteric bursitis of both hips 05/25/2016  . Neck pain 05/25/2016  . Urinary urgency 04/19/2016  . Chronic low back pain 01/11/2016  . Depression 11/29/2015  . 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 Heart palpitations 12/14/2013  . Generalized osteoarthritis of multiple sites 11/04/2013  . Cerebral infarction ([place]) 10/30/2011  . PFO (patent foramen ovale) 10/30/2011  . Bradycardia 10/30/2011  . Chest pain 10/30/2011  . Hypothyroid   . Thrombophlebitis   . Sleep apnea   . Fibromyalgia   . Status post bariatric surgery 12/07/2010  .  Vein disorder 11/29/2010  . S/P total hysterectomy and bilateral salpingo-oophorectomy 11/29/2010  . S/P exploratory laparotomy 11/29/2010  . S/P cholecystectomy 11/29/2010  . S/P ACL surgery 11/29/2010  . Morbid obesity ([place]) 11/10/2010   Past Medical History:  Diagnosis Date  . ADD (attention deficit disorder)    takes Adderall daily  . Arthritis    "all over"  . Cerebral infarction ([place]) 10/30/2011  . Cerebrovascular disease 08/14/2016  . Chronic back pain    "all over"  . Chronic low back pain 01/11/2016  . Complication of anesthesia    tends to have hypotension when NPO and post-anesthesia  . Constipation    takes stool softener daily  . Degenerative disk disease   . Degenerative joint disease   . Depression    takes Cymbalta daily for pain per pt  . Diabetes mellitus without complication ([place])   . DVT (deep venous thrombosis) ([facility])    RLE  . Family history of adverse reaction to anesthesia    a family member woke up during surgery; "think it was my mom"  . Fibromyalgia   . Generalized osteoarthritis of multiple sites 11/04/2013  . GERD (gastroesophageal reflux disease)   . Heart palpitations 12/14/2013  . History of blood clots    superficial  . Hypoglycemia   . Hypothyroid    takes Synthroid daily  . Incomplete emptying of bladder   . Insomnia    takes Trazodone nightly  . Iron deficiency anemia    takes Ferrous Sulfate daily  . Joint pain   . Joint swelling    knees and ankles  . Memory disorder 08/14/2016  . Morbid obesity ([place])   . Nausea    takes Zofran as needed.Seeing GI doc  . Neck pain 05/25/2016  . OSA on CPAP    tested more than 5 yrs ago.    . Osteoarthritis   . PFO (patent foramen ovale)   . Primary osteoarthritis of both feet 05/29/2016   Right bunionectomy August 2017 by Dr. Sharol Given  . Scoliosis   . Sleep apnea   . Stroke [facility]) "several"   right foot weakness; memory issues, black spot right visual field since" (03/23/2015)  . Thrombophlebitis     . Trochanteric bursitis of both hips 05/25/2016  . Unilateral primary osteoarthritis, right knee 05/29/2016  . Urinary urgency 04/19/2016  . Vein disorder 11/29/2010    Family History  Problem Relation Age of Onset  . Heart disease Father   . Cancer Father   . Parkinson's disease Father   . Cancer Mother        skin cancer   . Myasthenia gravis Mother   . Heart disease Brother   . Cancer Brother   . Diabetes Brother   . Stroke Brother   . Heart disease Sister   . Heart attack Sister   . Cancer Maternal Grandfather   . Hypothyroidism Daughter   . Hypertension Other   .  Colon cancer Neg Hx     Past Surgical History:  Procedure Laterality Date  . BIOPSY  11/14/2018   Procedure: BIOPSY;  Surgeon: Rogene Houston, MD;  [facility];  Service: Endoscopy;;  esophagus gastric  . BONE EXCISION Right 08/29/2017   Procedure: right trapezium excision;  Surgeon: Daryll Brod, MD;  Location: [facility];  Service: Orthopedics;  Laterality: Right;  . BUNIONECTOMY Right 08/2015  . CARDIAC CATHETERIZATION     2008.  "it was fine" (not sure why she had it done, and doesn't know where)  . CARPOMETACARPEL SUSPENSION PLASTY Right 08/29/2017   Procedure: SUSPENSION PLASTY RIGHT THUMB;  Surgeon: Daryll Brod, MD;  Location: [facility];  Service: Orthopedics;  Laterality: Right;  . COLONOSCOPY N/A 03/25/2013   Procedure: COLONOSCOPY;  Surgeon: Rogene Houston, MD;  [facility];  Service: Endoscopy;  Laterality: N/A;  930  . ESOPHAGOGASTRODUODENOSCOPY    . ESOPHAGOGASTRODUODENOSCOPY (EGD) WITH PROPOFOL N/A 11/14/2018   Procedure: ESOPHAGOGASTRODUODENOSCOPY (EGD) WITH PROPOFOL;  Surgeon: Rogene Houston, MD;  [facility];  Service: Endoscopy;  Laterality: N/A;  1:55pm-office moved to 11:00am/pt notified to arrive at 9:30am per KF  . EXPLORATORY LAPAROTOMY     "took fallopian tubes out"  . JOINT REPLACEMENT     bil knee   . KNEE ARTHROPLASTY     . KNEE ARTHROSCOPY Left   . KNEE ARTHROSCOPY W/ ACL RECONSTRUCTION Right    "added pins"  . LAPAROSCOPIC CHOLECYSTECTOMY  ~ 2001  . 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 SPINAL CORD STIMULATOR INSERTION N/A 04/18/2017   Procedure: LUMBAR SPINAL CORD STIMULATOR INSERTION;  Surgeon: Clydell Hakim, MD;  Location: [facility];  Service: Neurosurgery;  Laterality: N/A;  LUMBAR SPINAL CORD STIMULATOR INSERTION  . TENDON TRANSFER Right 08/29/2017   Procedure: right abductor pollicis longus transfer;  Surgeon: Daryll Brod, MD;  Location: [facility];  Service: Orthopedics;  Laterality: Right;  . TOTAL KNEE ARTHROPLASTY Left 03/23/2015   Procedure: TOTAL KNEE ARTHROPLASTY;  Surgeon: Newt Minion, MD;  Location: [facility];  Service: Orthopedics;  Laterality: Left;  . TOTAL KNEE ARTHROPLASTY Right 08/15/2016   Procedure: RIGHT TOTAL KNEE ARTHROPLASTY, REMOVAL ACL SCREWS;  Surgeon: Newt Minion, MD;  Location: [place];  Service: Orthopedics;  Laterality: Right;  . TOTAL KNEE ARTHROPLASTY WITH HARDWARE REMOVAL Right   . VAGINAL HYSTERECTOMY    . VARICOSE VEIN SURGERY Right X 2   Social History   Occupational History  . Occupation: Disability  . Occupation: formerly Therapist, sports, [employer]  Tobacco Use  . Smoking status: Former Smoker    Packs/day: 0.75    Years: 8.00    Pack years: 6.00    Types: Cigarettes    Quit date: 12/01/1990    Years since quitting: 28.3  . Smokeless tobacco: Never Used  . Tobacco comment: quit smoking in the 1990s  Substance and Sexual Activity  . Alcohol use: No    Comment: 03/23/2015 "stopped drinking in 2012 w/gastric bypass; drank socially before bypass"  . Drug use: No  . Sexual activity: Not Currently    Birth control/protection: Surgical

## 2019-04-17 NOTE — Telephone Encounter (Signed)
Images were emailed to her yesterday.

## 2019-04-19 IMAGING — US US CAROTID DUPLEX BILAT
1 series · 13 of 24 positions shown · non-contrast
Comparison: 05/01/2017 CT angiogram of the neck.

CLINICAL DATA: 56 y/o F; speech difficulty, altered mental status,
history of stroke.

EXAM:
BILATERAL CAROTID DUPLEX ULTRASOUND
TECHNIQUE: Gray scale imaging, color Doppler and duplex ultrasound were
performed of bilateral carotid and vertebral arteries in the neck.

[Series 1: us carotid duplex bilat · 0.06mm/px · 13 of 72 slices shown]
[im 1/72]
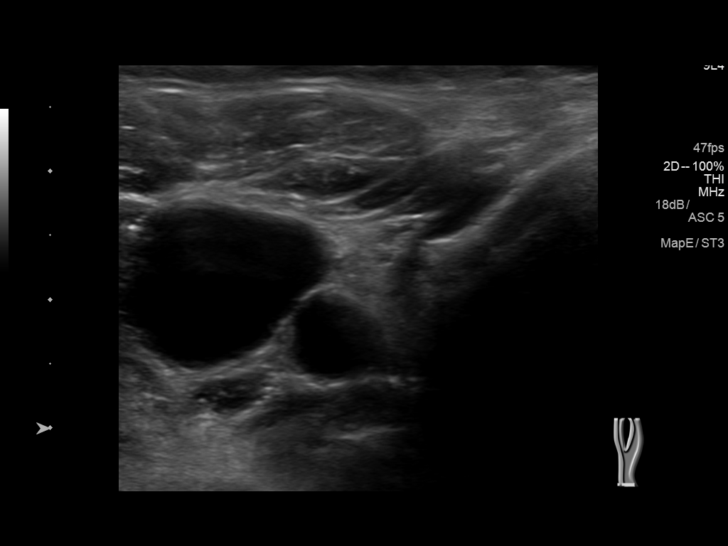
[im 7/72]
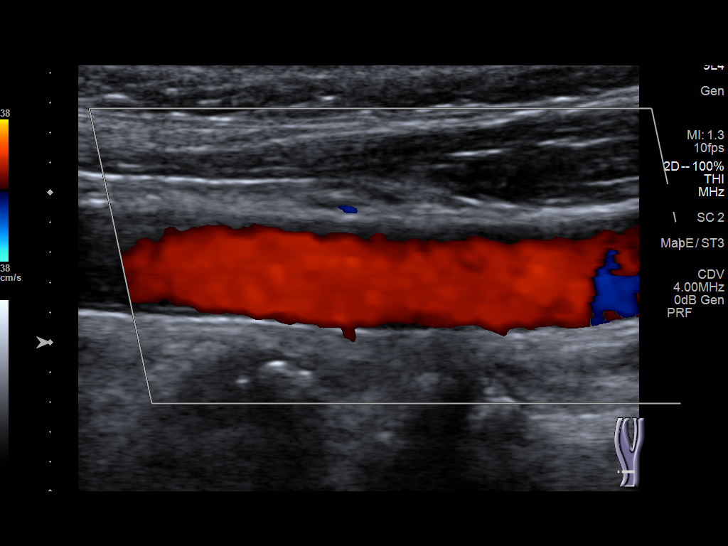
[im 13/72]
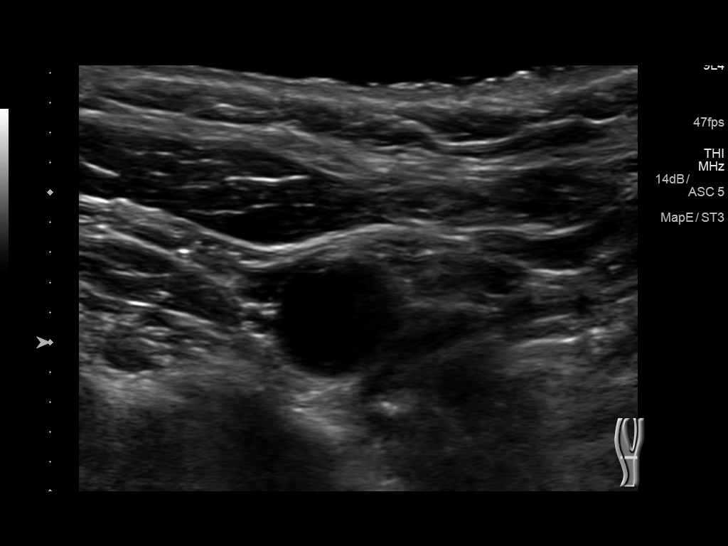
[im 19/72]
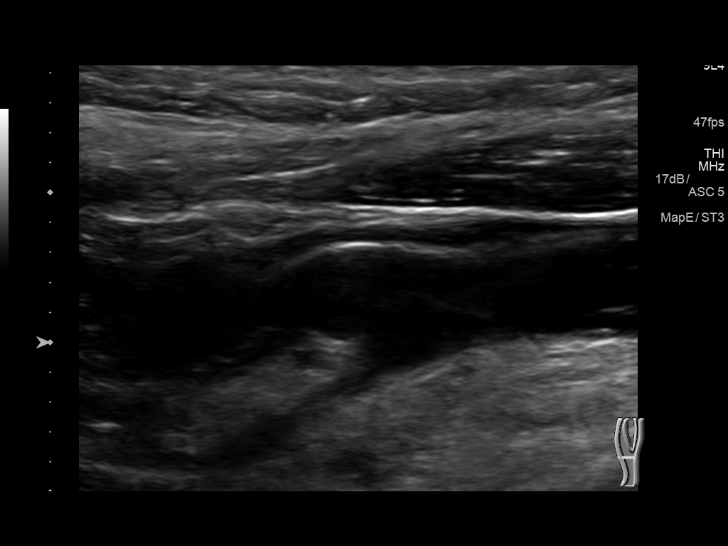
[im 25/72]
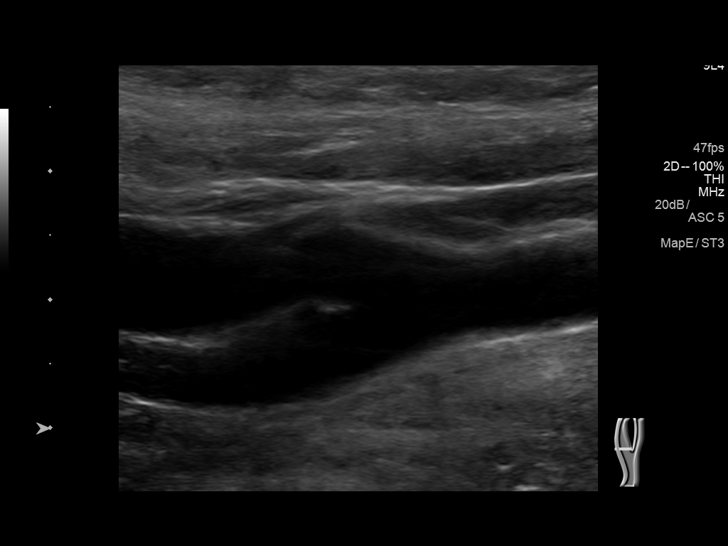
[im 31/72]
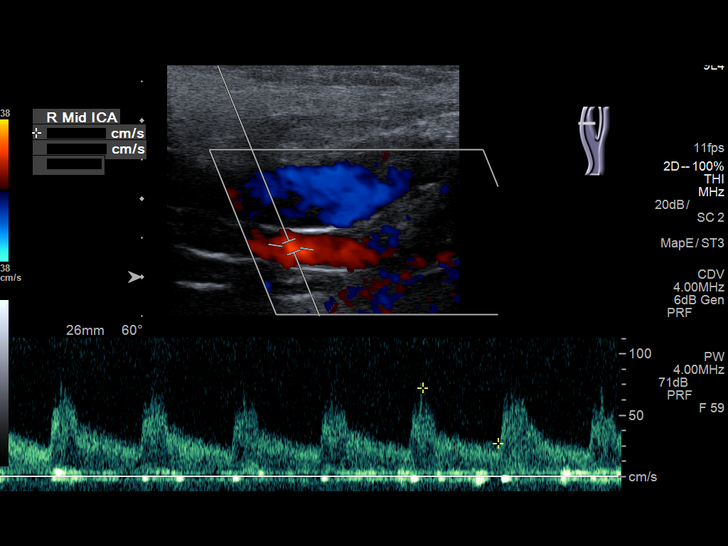
[im 38/72]
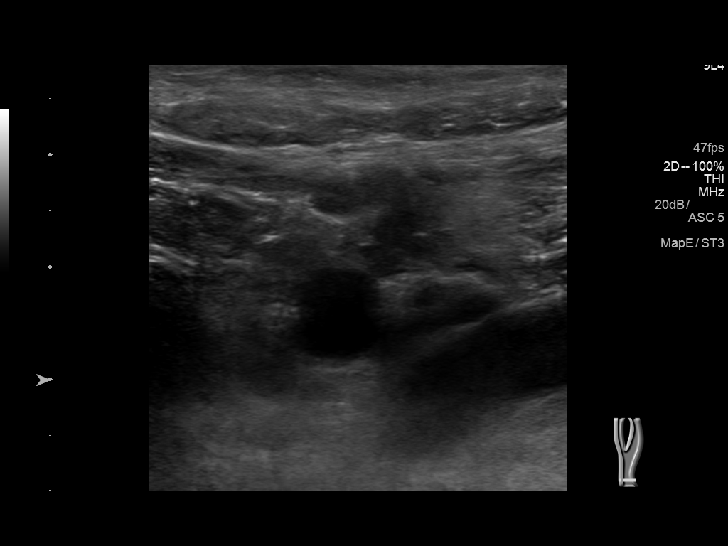
[im 41/72]
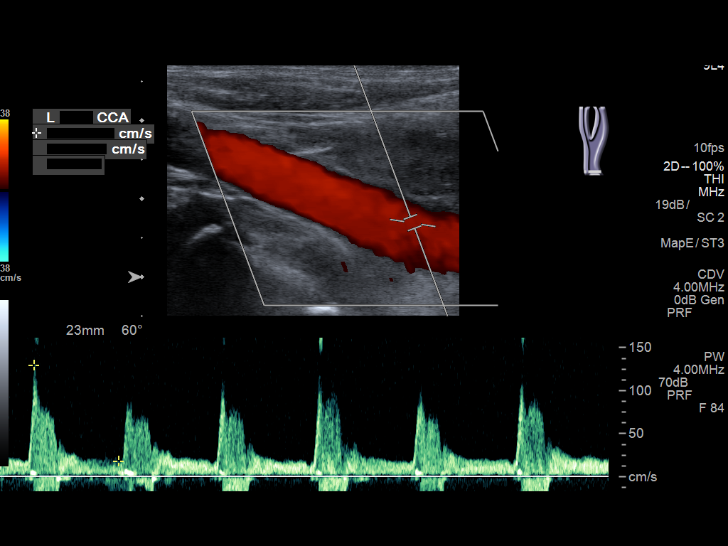
[im 47/72]
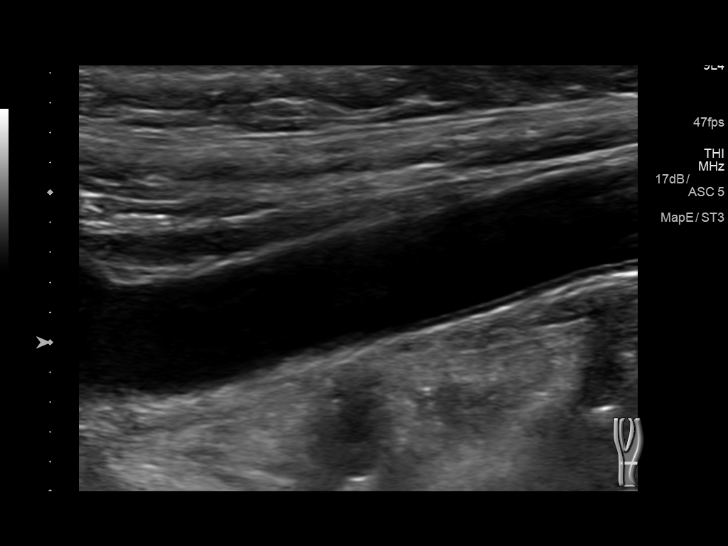
[im 53/72]
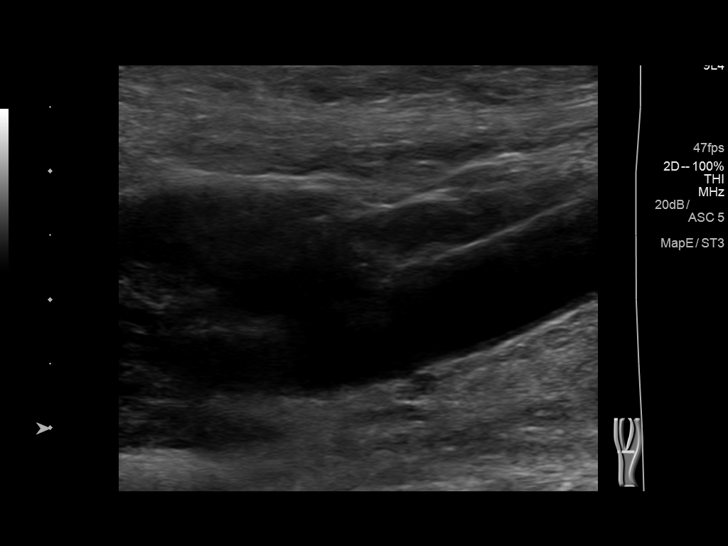
[im 59/72]
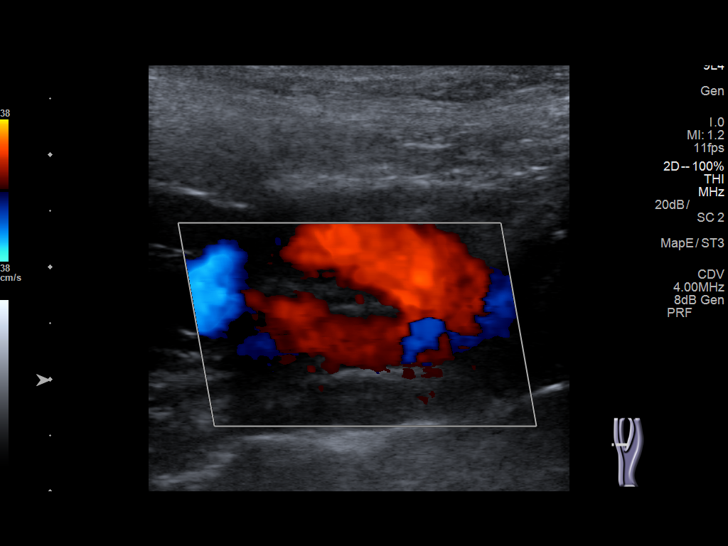
[im 65/72]
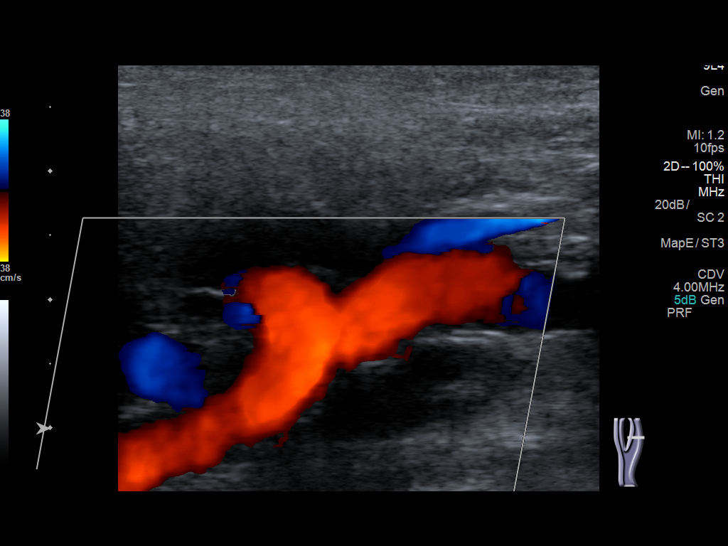
[im 72/72]
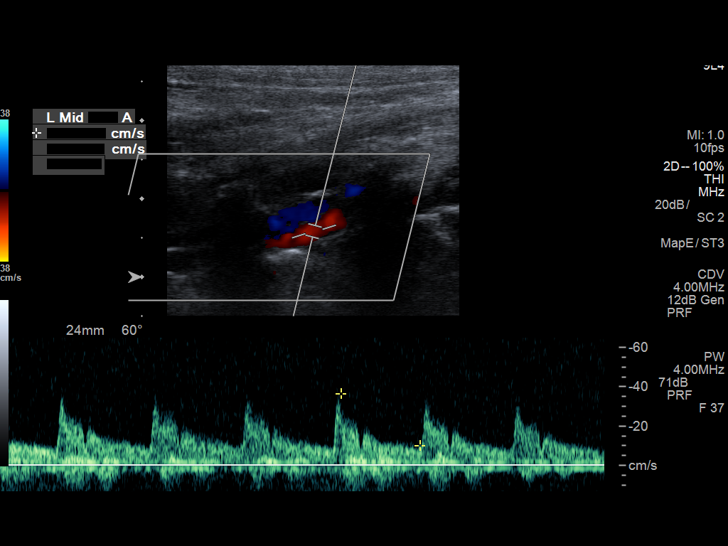

[13 of 24 positions shown; findings below may reference images not displayed]

FINDINGS: Criteria: Quantification of carotid stenosis is based on velocity
parameters that correlate the residual internal carotid diameter
with NASCET-based stenosis levels, using the diameter of the distal
internal carotid lumen as the denominator for stenosis measurement.

The following velocity measurements were obtained:

RIGHT

ICA:  82 cm/sec

CCA:  82 cm/sec

SYSTOLIC ICA/CCA RATIO:

DIASTOLIC ICA/CCA RATIO:

ECA:  80 cm/sec

LEFT

ICA:  122 cm/sec

CCA:  120 cm/sec

SYSTOLIC ICA/CCA RATIO:

DIASTOLIC ICA/CCA RATIO:

ECA:  84 cm/sec

RIGHT CAROTID ARTERY: Intimal wall thickening. No significant
plaque.

RIGHT VERTEBRAL ARTERY:  Antegrade flow.

LEFT CAROTID ARTERY: Intimal wall thickening. No significant plaque.

LEFT VERTEBRAL ARTERY:  Antegrade flow.
IMPRESSION: No hemodynamically significant stenosis of the carotid arteries.
Antegrade vertebral artery flow.

By: Yette Dalou M.D.

## 2019-04-19 IMAGING — US US ABDOMEN LIMITED
1 series · 14 of 25 positions shown · non-contrast
Comparison: CT, 03/04/2017

CLINICAL DATA: Transaminitis.  History of a cholecystectomy.

EXAM:
ULTRASOUND ABDOMEN LIMITED RIGHT UPPER QUADRANT

[Series 1: us abdomen limited · 0.16mm/px · 14 of 49 slices shown]
[im 1/49]
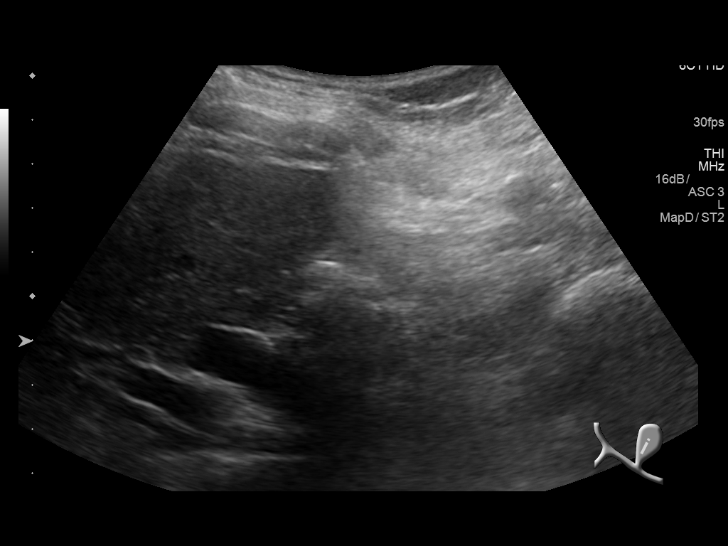
[im 5/49]
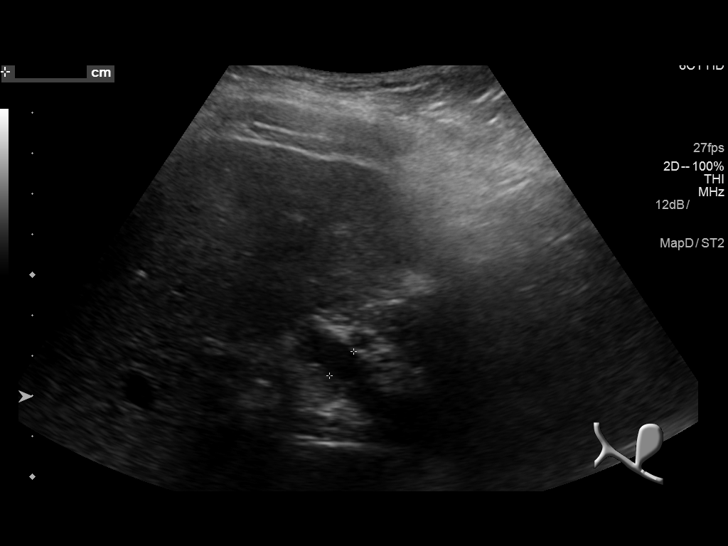
[im 9/49]
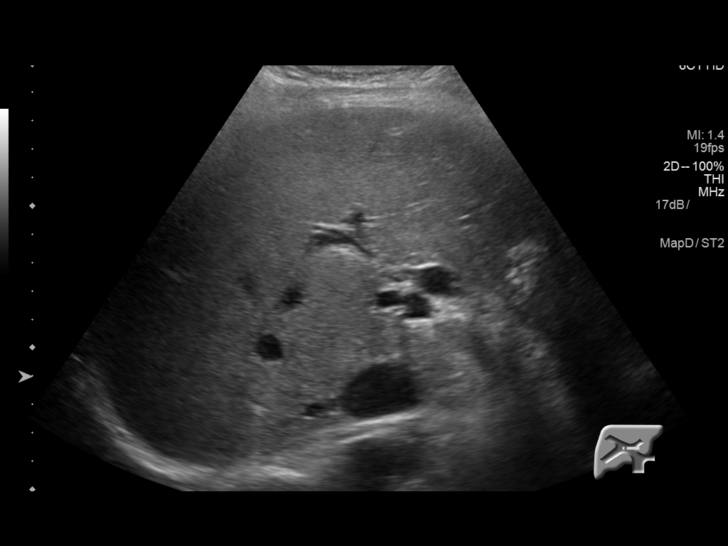
[im 13/49]
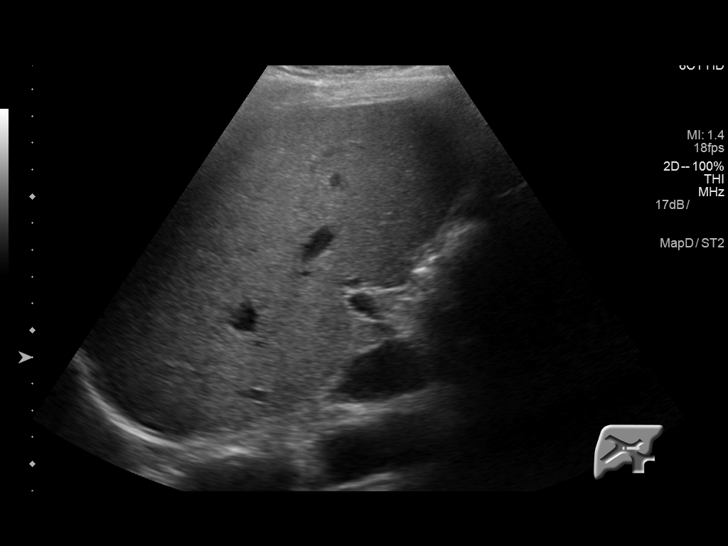
[im 17/49]
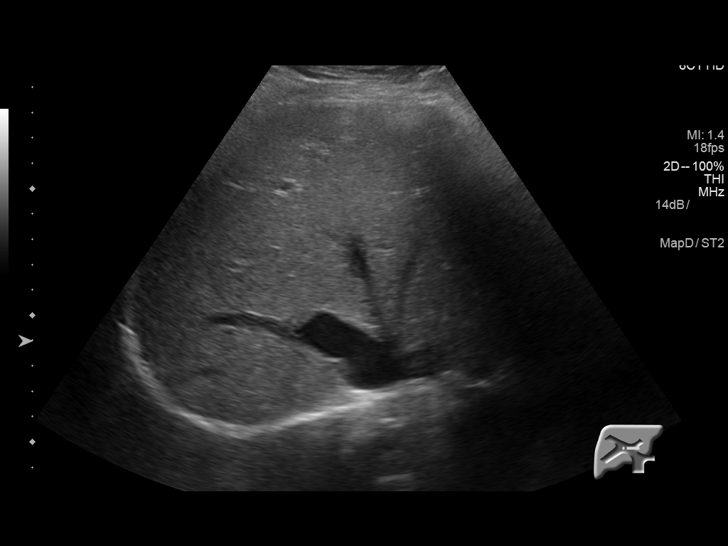
[im 19/49]
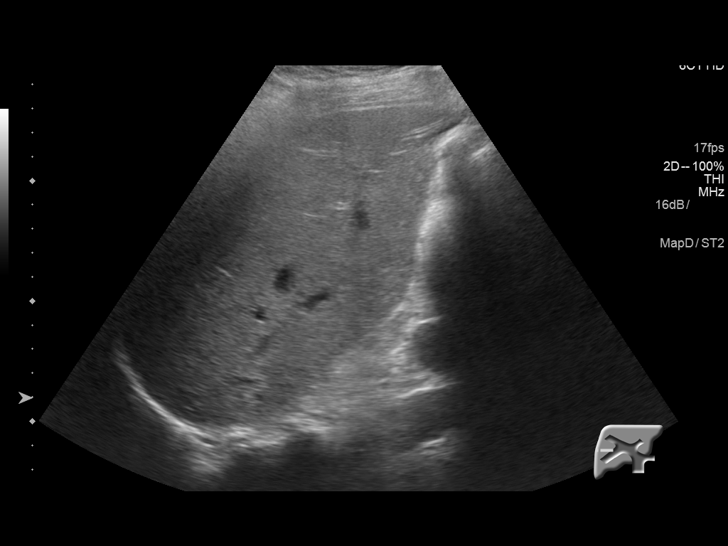
[im 23/49]
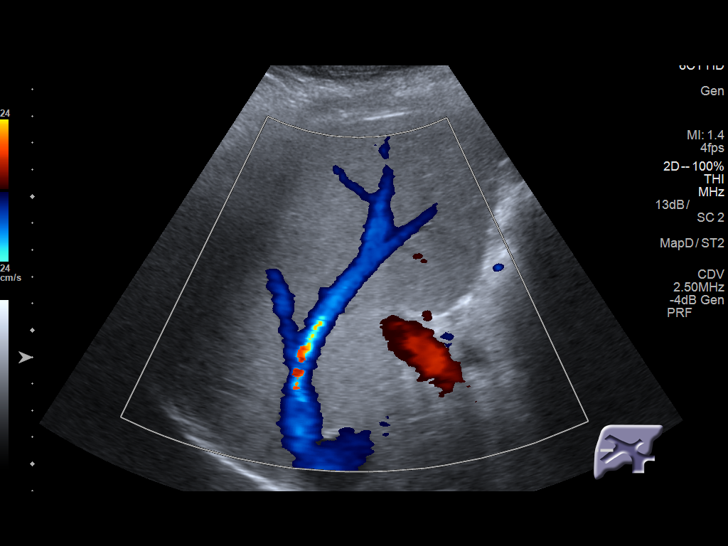
[im 27/49]
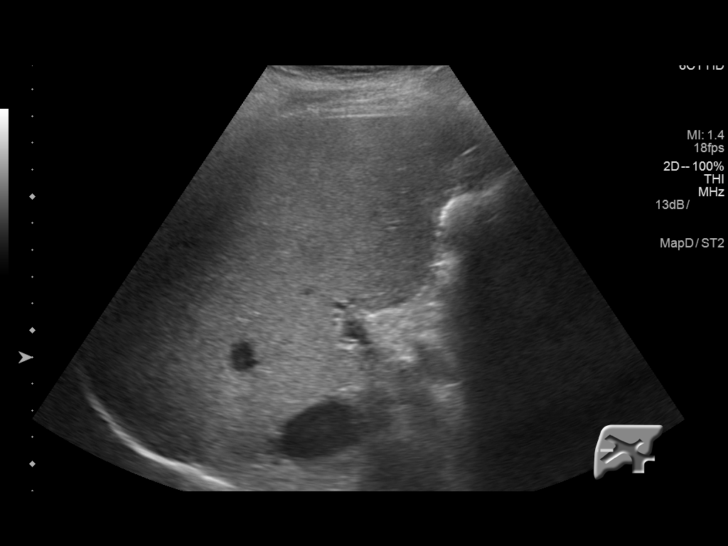
[im 31/49]
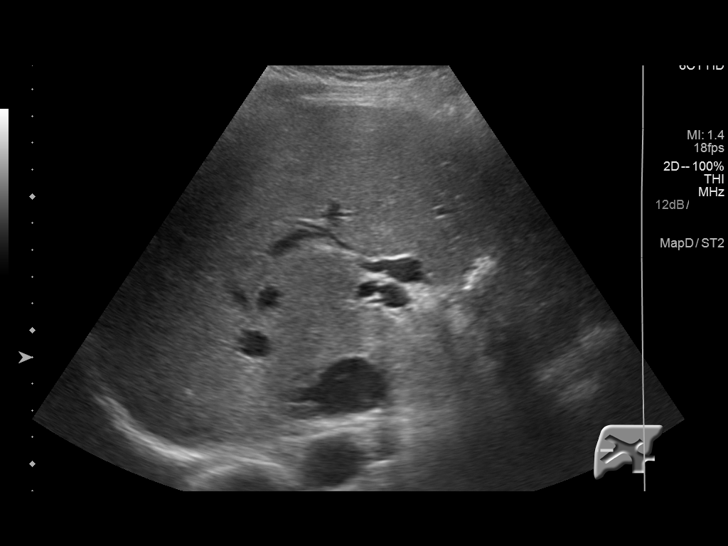
[im 33/49]
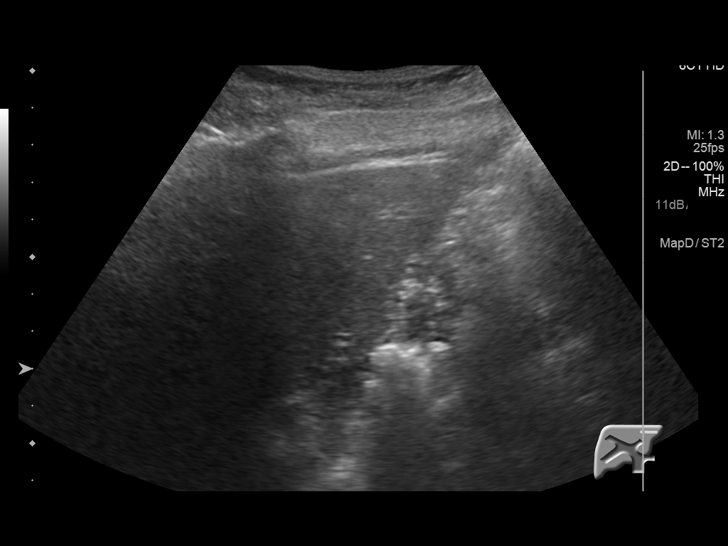
[im 37/49]
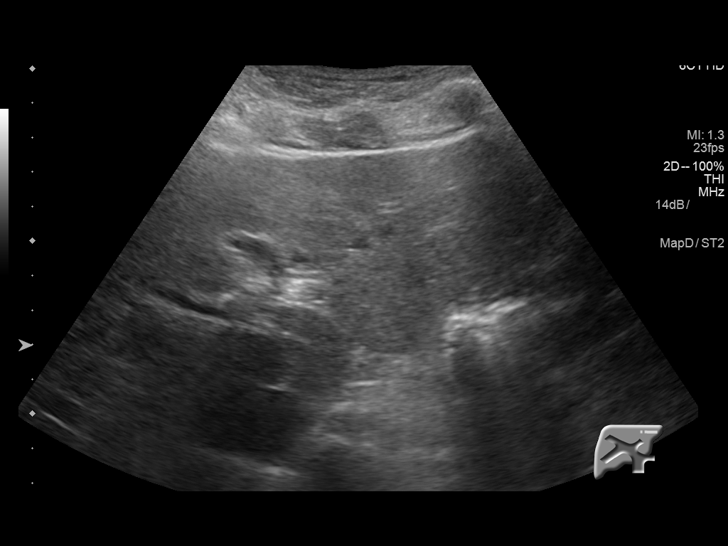
[im 41/49]
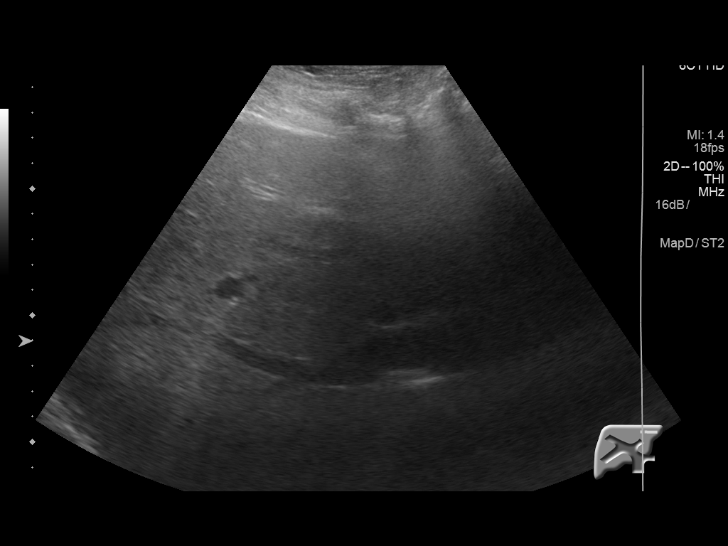
[im 45/49]
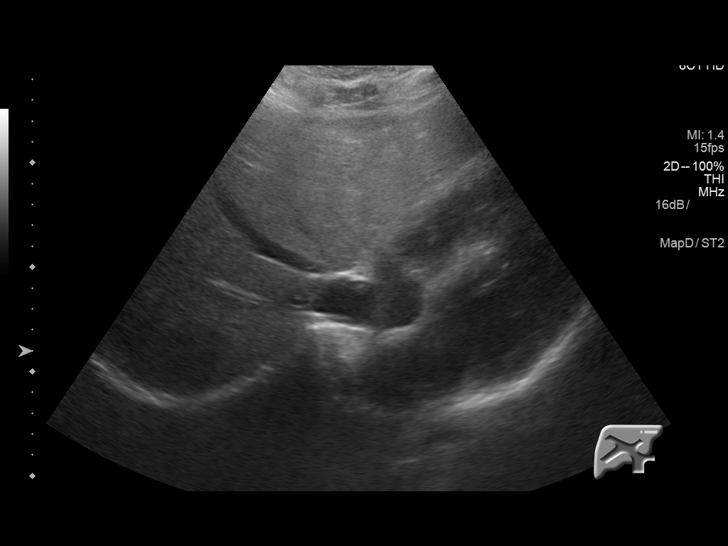
[im 49/49]
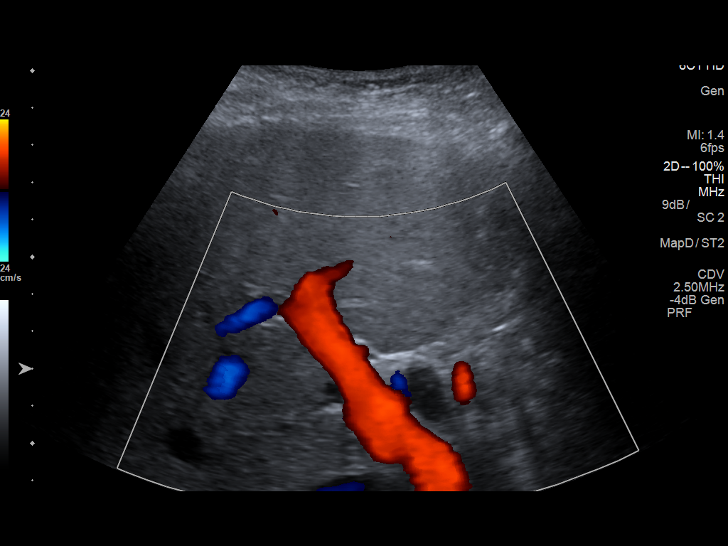

[14 of 25 positions shown; findings below may reference images not displayed]

FINDINGS: Gallbladder:

Status post cholecystectomy. No abnormality in the gallbladder fossa

Common bile duct:

Diameter: 10 mm, similar to the prior CT. No duct stone seen
sonographically.

Liver:

No focal lesion identified. Within normal limits in parenchymal
echogenicity. Portal vein is patent on color Doppler imaging with
normal direction of blood flow towards the liver.
IMPRESSION: 1. No acute findings.
2. Status post cholecystectomy. Chronic dilation of common bile
duct.
3. Liver appearance is unremarkable.

## 2019-04-21 ENCOUNTER — Telehealth (INDEPENDENT_AMBULATORY_CARE_PROVIDER_SITE_OTHER): Payer: Self-pay | Admitting: Internal Medicine

## 2019-04-21 NOTE — Telephone Encounter (Signed)
Patient left message wanting to know when her next appointment is and also wanted to know about when she would have lab work done - she doesn't have an appointment scheduled - would you check with NUR - please advise - [phone]

## 2019-04-22 DIAGNOSIS — M797 Fibromyalgia: Secondary | ICD-10-CM | POA: Diagnosis not present

## 2019-04-22 DIAGNOSIS — E7849 Other hyperlipidemia: Secondary | ICD-10-CM | POA: Diagnosis not present

## 2019-04-22 DIAGNOSIS — E063 Autoimmune thyroiditis: Secondary | ICD-10-CM | POA: Diagnosis not present

## 2019-04-22 DIAGNOSIS — M1711 Unilateral primary osteoarthritis, right knee: Secondary | ICD-10-CM | POA: Diagnosis not present

## 2019-04-23 ENCOUNTER — Encounter (INDEPENDENT_AMBULATORY_CARE_PROVIDER_SITE_OTHER): Payer: Self-pay

## 2019-04-23 NOTE — Telephone Encounter (Signed)
Per Dr.Rehman she will need to have appointment 6 months from her last one. That looks like August 2021. Lab work may be prior to that. Please let the patient know/make appointment.

## 2019-04-29 DIAGNOSIS — M792 Neuralgia and neuritis, unspecified: Secondary | ICD-10-CM | POA: Diagnosis not present

## 2019-04-29 DIAGNOSIS — M797 Fibromyalgia: Secondary | ICD-10-CM | POA: Diagnosis not present

## 2019-04-29 DIAGNOSIS — M5136 Other intervertebral disc degeneration, lumbar region: Secondary | ICD-10-CM | POA: Diagnosis not present

## 2019-04-30 ENCOUNTER — Other Ambulatory Visit: Payer: Self-pay

## 2019-04-30 ENCOUNTER — Ambulatory Visit: Payer: PPO | Admitting: Specialist

## 2019-04-30 ENCOUNTER — Encounter: Payer: Self-pay | Admitting: Specialist

## 2019-04-30 VITALS — BP 116/70 | HR 64 | Ht 65.0 in | Wt 207.0 lb

## 2019-04-30 DIAGNOSIS — M4726 Other spondylosis with radiculopathy, lumbar region: Secondary | ICD-10-CM

## 2019-04-30 DIAGNOSIS — M7062 Trochanteric bursitis, left hip: Secondary | ICD-10-CM

## 2019-04-30 DIAGNOSIS — M4155 Other secondary scoliosis, thoracolumbar region: Secondary | ICD-10-CM | POA: Diagnosis not present

## 2019-04-30 DIAGNOSIS — M7602 Gluteal tendinitis, left hip: Secondary | ICD-10-CM | POA: Diagnosis not present

## 2019-04-30 NOTE — Progress Notes (Signed)
Office Visit Note   Patient: Debbie Pineda           Date of Birth: [birth year]           MRN: [id number] Visit Date: 04/30/2019              Requested by: Jake Samples, PA-C [address] [place],  [place] [address] PCP: Jake Samples, PA-C   Assessment & Plan: Visit Diagnoses:  1. Other spondylosis with radiculopathy, lumbar region   2. Other secondary scoliosis, thoracolumbar region   3. Trochanteric bursitis, left hip   4. Gluteal tendonitis of left buttock     Avoid bending, stooping and avoid lifting weights greater than 10 lbs. Avoid prolong standing and walking. Avoid frequent bending and stooping  No lifting greater than 10 lbs. May use ice or moist heat for pain. Weight loss is of benefit. Handicap license is approved. Dr. Romona Curls secretary/Assistant will call to arrange for epidural steroid injection   Follow-Up Instructions: No follow-ups on file.   Orders:  Orders Placed This Encounter  Procedures  . Ambulatory referral to Physical Medicine Rehab   No orders of the defined types were placed in this encounter.     Procedures: No procedures performed   Clinical Data: No additional findings.   Subjective: Chief Complaint  Patient presents with  . Left Hip - Follow-up    Had Left Greater Troch injection with Dr. Junius Roads on 04/16/19, states that she did get some relief, can do stairs w/o much pain, still has sciatica pain    59 year old female with history of multiple lumbar DDD L1-2, L2-3 and L3-4 and L5-S1 with left sided greater trochanteric  Bursitis. She retunrs today with improved pain in the area of the left greater trochanter but persistent sciatica on the left side with pain that  Radiates into the left lateral thigh and left lateral calf. She has a spinal cord stimulator in place that helps some. Has urinary incontinence.  Standing and walking is painful.    Review of Systems  Constitutional: Negative.   HENT:  Negative.   Eyes: Negative.   Respiratory: Negative.   Cardiovascular: Negative.   Gastrointestinal: Negative.   Endocrine: Negative.   Genitourinary: Negative.   Musculoskeletal: Negative.   Skin: Negative.   Allergic/Immunologic: Negative.   Neurological: Negative.   Hematological: Negative.   Psychiatric/Behavioral: Negative.      Objective: Vital Signs: BP 116/70 (BP Location: Left Arm, Patient Position: Sitting)   Pulse 64   Ht 5\' 5"  (1.651 m)   Wt 207 lb (93.9 kg)   BMI 34.45 kg/m   Physical Exam Constitutional:      Appearance: She is well-developed.  HENT:     Head: Normocephalic and atraumatic.  Eyes:     Pupils: Pupils are equal, round, and reactive to light.  Pulmonary:     Effort: Pulmonary effort is normal.     Breath sounds: Normal breath sounds.  Abdominal:     General: Bowel sounds are normal.     Palpations: Abdomen is soft.  Musculoskeletal:        General: Normal range of motion.     Cervical back: Normal range of motion and neck supple.     Lumbar back: Negative right straight leg raise test and negative left straight leg raise test.  Skin:    General: Skin is warm and dry.  Neurological:     Mental Status: She is alert and oriented to person,  place, and time.  Psychiatric:        Behavior: Behavior normal.        Thought Content: Thought content normal.        Judgment: Judgment normal.     Back Exam   Tenderness  The patient is experiencing tenderness in the lumbar.  Muscle Strength  Right Quadriceps:  5/5  Left Quadriceps:  5/5  Right Hamstrings:  5/5  Left Hamstrings:  5/5   Tests  Straight leg raise right: negative Straight leg raise left: negative  Reflexes  Patellar: 2/4 Achilles: 2/4  Other  Toe walk: normal Heel walk: normal  Comments:  Left EHL is mildly weak 4/5      Specialty Comments:  No specialty comments available.  Imaging: No results found.   PMFS History: Patient Active Problem List    Diagnosis Date Noted  . Migraine 11/11/2018  . Abdominal pain, epigastric 10/14/2018  . LUQ pain 10/14/2018  . Attention deficit hyperactivity disorder (ADHD) 01/12/2018  . Insomnia 01/12/2018  . Elevated liver enzymes 09/10/2017  . History of diabetes mellitus 06/06/2017  . Primary osteoarthritis of right hand 06/06/2017  . Transaminasemia   . TIA (transient ischemic attack) 05/01/2017  . Dysphasia 05/01/2017  . Chronic pain syndrome 05/01/2017  . Confusion   . Presence of right artificial knee joint 09/17/2016  . H/O total knee replacement, right 08/15/2016  . Presence of retained hardware   . Memory disorder 08/14/2016  . Cerebrovascular disease 08/14/2016  . Unilateral primary osteoarthritis, right knee 05/29/2016  . Primary osteoarthritis of both feet 05/29/2016  . Trochanteric bursitis of both hips 05/25/2016  . Neck pain 05/25/2016  . Urinary urgency 04/19/2016  . Chronic low back pain 01/11/2016  . Depression 11/29/2015  . 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 Heart palpitations 12/14/2013  . Generalized osteoarthritis of multiple sites 11/04/2013  . Cerebral infarction ([facility]) 10/30/2011  . PFO (patent foramen ovale) 10/30/2011  . Bradycardia 10/30/2011  . Chest pain 10/30/2011  . Hypothyroid   . Thrombophlebitis   . Sleep apnea   . Fibromyalgia   . Status post bariatric surgery 12/07/2010  . Vein disorder 11/29/2010  . S/P total hysterectomy and bilateral salpingo-oophorectomy 11/29/2010  . S/P exploratory laparotomy 11/29/2010  . S/P cholecystectomy 11/29/2010  . S/P ACL surgery 11/29/2010  . Morbid obesity ([facility]) 11/10/2010   Past Medical History:  Diagnosis Date  . ADD (attention deficit disorder)    takes Adderall daily  . Arthritis    "all over"  . Cerebral infarction ([facility]) 10/30/2011  . Cerebrovascular disease 08/14/2016  . Chronic back pain    "all over"  . Chronic low back pain 01/11/2016  . Complication of anesthesia    tends to have  hypotension when NPO and post-anesthesia  . Constipation    takes stool softener daily  . Degenerative disk disease   . Degenerative joint disease   . Depression    takes Cymbalta daily for pain per pt  . Diabetes mellitus without complication ([facility])   . DVT (deep venous thrombosis) ([facility])    RLE  . Family history of adverse reaction to anesthesia    a family member woke up during surgery; "think it was my mom"  . Fibromyalgia   . Generalized osteoarthritis of multiple sites 11/04/2013  . GERD (gastroesophageal reflux disease)   . Heart palpitations 12/14/2013  . History of blood clots    superficial  . Hypoglycemia   . Hypothyroid    takes Synthroid  daily  . Incomplete emptying of bladder   . Insomnia    takes Trazodone nightly  . Iron deficiency anemia    takes Ferrous Sulfate daily  . Joint pain   . Joint swelling    knees and ankles  . Memory disorder 08/14/2016  . Morbid obesity ([facility])   . Nausea    takes Zofran as needed.Seeing GI doc  . Neck pain 05/25/2016  . OSA on CPAP    tested more than 5 yrs ago.    . Osteoarthritis   . PFO (patent foramen ovale)   . Primary osteoarthritis of both feet 05/29/2016   Right bunionectomy August 2017 by Dr. Sharol Given  . Scoliosis   . Sleep apnea   . Stroke [facility]) "several"   right foot weakness; memory issues, black spot right visual field since" (03/23/2015)  . Thrombophlebitis   . Trochanteric bursitis of both hips 05/25/2016  . Unilateral primary osteoarthritis, right knee 05/29/2016  . Urinary urgency 04/19/2016  . Vein disorder 11/29/2010    Family History  Problem Relation Age of Onset  . Heart disease Father   . Cancer Father   . Parkinson's disease Father   . Cancer Mother        skin cancer   . Myasthenia gravis Mother   . Heart disease Brother   . Cancer Brother   . Diabetes Brother   . Stroke Brother   . Heart disease Sister   . Heart attack Sister   . Cancer Maternal Grandfather   . Hypothyroidism Daughter   .  Hypertension Other   . Colon cancer Neg Hx     Past Surgical History:  Procedure Laterality Date  . BIOPSY  11/14/2018   Procedure: BIOPSY;  Surgeon: Rogene Houston, MD;  [facility];  Service: Endoscopy;;  esophagus gastric  . BONE EXCISION Right 08/29/2017   Procedure: right trapezium excision;  Surgeon: Daryll Brod, MD;  Location: [facility];  Service: Orthopedics;  Laterality: Right;  . BUNIONECTOMY Right 08/2015  . CARDIAC CATHETERIZATION     2008.  "it was fine" (not sure why she had it done, and doesn't know where)  . CARPOMETACARPEL SUSPENSION PLASTY Right 08/29/2017   Procedure: SUSPENSION PLASTY RIGHT THUMB;  Surgeon: Daryll Brod, MD;  Location: [facility];  Service: Orthopedics;  Laterality: Right;  . COLONOSCOPY N/A 03/25/2013   Procedure: COLONOSCOPY;  Surgeon: Rogene Houston, MD;  [facility];  Service: Endoscopy;  Laterality: N/A;  930  . ESOPHAGOGASTRODUODENOSCOPY    . ESOPHAGOGASTRODUODENOSCOPY (EGD) WITH PROPOFOL N/A 11/14/2018   Procedure: ESOPHAGOGASTRODUODENOSCOPY (EGD) WITH PROPOFOL;  Surgeon: Rogene Houston, MD;  [facility];  Service: Endoscopy;  Laterality: N/A;  1:55pm-office moved to 11:00am/pt notified to arrive at 9:30am per KF  . EXPLORATORY LAPAROTOMY     "took fallopian tubes out"  . JOINT REPLACEMENT     bil knee   . KNEE ARTHROPLASTY    . KNEE ARTHROSCOPY Left   . KNEE ARTHROSCOPY W/ ACL RECONSTRUCTION Right    "added pins"  . LAPAROSCOPIC CHOLECYSTECTOMY  ~ 2001  . 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 SPINAL CORD STIMULATOR INSERTION N/A 04/18/2017   Procedure: LUMBAR SPINAL CORD STIMULATOR INSERTION;  Surgeon: Clydell Hakim, MD;  Location: [facility];  Service: Neurosurgery;  Laterality: N/A;  LUMBAR SPINAL CORD STIMULATOR INSERTION  . TENDON TRANSFER Right 08/29/2017   Procedure: right abductor pollicis longus transfer;  Surgeon: Daryll Brod, MD;  Location: [facility];   Service: Orthopedics;  Laterality: Right;  . TOTAL KNEE ARTHROPLASTY Left 03/23/2015   Procedure: TOTAL KNEE ARTHROPLASTY;  Surgeon: Newt Minion, MD;  Location: [facility];  Service: Orthopedics;  Laterality: Left;  . TOTAL KNEE ARTHROPLASTY Right 08/15/2016   Procedure: RIGHT TOTAL KNEE ARTHROPLASTY, REMOVAL ACL SCREWS;  Surgeon: Newt Minion, MD;  Location: [facility];  Service: Orthopedics;  Laterality: Right;  . TOTAL KNEE ARTHROPLASTY WITH HARDWARE REMOVAL Right   . VAGINAL HYSTERECTOMY    . VARICOSE VEIN SURGERY Right X 2   Social History   Occupational History  . Occupation: Disability  . Occupation: formerly Therapist, sports, [employer]  Tobacco Use  . Smoking status: Former Smoker    Packs/day: 0.75    Years: 8.00    Pack years: 6.00    Types: Cigarettes    Quit date: 12/01/1990    Years since quitting: 28.4  . Smokeless tobacco: Never Used  . Tobacco comment: quit smoking in the 1990s  Substance and Sexual Activity  . Alcohol use: No    Comment: 03/23/2015 "stopped drinking in 2012 w/gastric bypass; drank socially before bypass"  . Drug use: No  . Sexual activity: Not Currently    Birth control/protection: Surgical

## 2019-04-30 NOTE — Patient Instructions (Signed)
Avoid bending, stooping and avoid lifting weights greater than 10 lbs. Avoid prolong standing and walking. Avoid frequent bending and stooping  No lifting greater than 10 lbs. May use ice or moist heat for pain. Weight loss is of benefit. Handicap license is approved. Dr. Newton's secretary/Assistant will call to arrange for epidural steroid injection  

## 2019-05-19 ENCOUNTER — Other Ambulatory Visit: Payer: Self-pay

## 2019-05-19 ENCOUNTER — Ambulatory Visit: Payer: PPO | Admitting: Neurology

## 2019-05-19 ENCOUNTER — Encounter: Payer: Self-pay | Admitting: Neurology

## 2019-05-19 VITALS — BP 102/68 | HR 78 | Temp 97.7°F | Wt 204.5 lb

## 2019-05-19 DIAGNOSIS — G43909 Migraine, unspecified, not intractable, without status migrainosus: Secondary | ICD-10-CM

## 2019-05-19 DIAGNOSIS — M797 Fibromyalgia: Secondary | ICD-10-CM | POA: Diagnosis not present

## 2019-05-19 DIAGNOSIS — R413 Other amnesia: Secondary | ICD-10-CM

## 2019-05-19 DIAGNOSIS — I679 Cerebrovascular disease, unspecified: Secondary | ICD-10-CM | POA: Diagnosis not present

## 2019-05-19 MED ORDER — DONEPEZIL HCL 10 MG PO TABS: 10.0000 mg | ORAL_TABLET | Freq: Every day | ORAL | 3 refills | Status: DC

## 2019-05-19 MED ORDER — METAFOLBIC 6-1-50-5 MG PO TABS: 1.0000 | ORAL_TABLET | Freq: Every day | ORAL | 3 refills | Status: DC

## 2019-05-19 MED ORDER — NURTEC 75 MG PO TBDP: 75.0000 mg | ORAL_TABLET | Freq: Every day | ORAL | 2 refills | Status: DC | PRN

## 2019-05-19 NOTE — Progress Notes (Signed)
Reason for visit: Migraine headache, fibromyalgia, cerebrovascular disease, mild memory disorder, history of seizures  Debbie Pineda is an 59 y.o. 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  The patient has had seizures secondary to this, but they have been well controlled on Topamax.  The patient continues to have migraine headaches that are mainly activated by bright lights.  She may have up to 4 headaches a week.  She is trying to use dark sunglasses that may help prevent headaches.  Stress oftentimes worsens her cognitive abilities, if she can stick with a single task, she seems to focus better and do better.  She does not believe there has been any change in her cognitive functioning over time.  The patient has a right superior quadrantanopsia, but she is able to operate a motor vehicle without difficulty.  She returns to the office today for an evaluation.  She is being followed by Dr. Louanne Pineda for left hip pain and back pain.  Past Medical History:  Diagnosis Date  . ADD (attention deficit disorder)    takes Adderall daily  . Arthritis    "all over"  . Cerebral infarction ([place]) 10/30/2011  . Cerebrovascular disease 08/14/2016  . Chronic back pain    "all over"  . Chronic low back pain 01/11/2016  . Complication of anesthesia    tends to have hypotension when NPO and post-anesthesia  . Constipation    takes stool softener daily  . Degenerative disk disease   . Degenerative joint disease   . Depression    takes Cymbalta daily for pain per pt  . Diabetes mellitus without complication ([place])   . DVT (deep venous thrombosis) ([facility])    RLE  . Family history of adverse reaction to anesthesia    a family member woke up during surgery; "think it was my mom"  . Fibromyalgia   . Generalized osteoarthritis of multiple  sites 11/04/2013  . GERD (gastroesophageal reflux disease)   . Heart palpitations 12/14/2013  . History of blood clots    superficial  . Hypoglycemia   . Hypothyroid    takes Synthroid daily  . Incomplete emptying of bladder   . Insomnia    takes Trazodone nightly  . Iron deficiency anemia    takes Ferrous Sulfate daily  . Joint pain   . Joint swelling    knees and ankles  . Memory disorder 08/14/2016  . Morbid obesity ([place])   . Nausea    takes Zofran as needed.Seeing GI doc  . Neck pain 05/25/2016  . OSA on CPAP    tested more than 5 yrs ago.    . Osteoarthritis   . PFO (patent foramen ovale)   . Primary osteoarthritis of both feet 05/29/2016   Right bunionectomy August 2017 by Dr. Sharol Given  . Scoliosis   . Sleep apnea   . Stroke [facility]) "several"   right foot weakness; memory issues, black spot right visual field since" (03/23/2015)  . Thrombophlebitis   . Trochanteric bursitis of both hips 05/25/2016  . Unilateral primary osteoarthritis, right knee 05/29/2016  . Urinary urgency 04/19/2016  . Vein disorder 11/29/2010    Past Surgical History:  Procedure Laterality Date  . BIOPSY  11/14/2018   Procedure: BIOPSY;  Surgeon: Rogene Houston, MD;  [facility];  Service: Endoscopy;;  esophagus gastric  .  BONE EXCISION Right 08/29/2017   Procedure: right trapezium excision;  Surgeon: Daryll Brod, MD;  Location: [place];  Service: Orthopedics;  Laterality: Right;  . BUNIONECTOMY Right 08/2015  . CARDIAC CATHETERIZATION     2008.  "it was fine" (not sure why she had it done, and doesn't know where)  . CARPOMETACARPEL SUSPENSION PLASTY Right 08/29/2017   Procedure: SUSPENSION PLASTY RIGHT THUMB;  Surgeon: Daryll Brod, MD;  Location: [place];  Service: Orthopedics;  Laterality: Right;  . COLONOSCOPY N/A 03/25/2013   Procedure: COLONOSCOPY;  Surgeon: Rogene Houston, MD;  [facility];  Service: Endoscopy;  Laterality: N/A;  930  .  ESOPHAGOGASTRODUODENOSCOPY    . ESOPHAGOGASTRODUODENOSCOPY (EGD) WITH PROPOFOL N/A 11/14/2018   Procedure: ESOPHAGOGASTRODUODENOSCOPY (EGD) WITH PROPOFOL;  Surgeon: Rogene Houston, MD;  [facility];  Service: Endoscopy;  Laterality: N/A;  1:55pm-office moved to 11:00am/pt notified to arrive at 9:30am per KF  . EXPLORATORY LAPAROTOMY     "took fallopian tubes out"  . JOINT REPLACEMENT     bil knee   . KNEE ARTHROPLASTY    . KNEE ARTHROSCOPY Left   . KNEE ARTHROSCOPY W/ ACL RECONSTRUCTION Right    "added pins"  . LAPAROSCOPIC CHOLECYSTECTOMY  ~ 2001  . 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 SPINAL CORD STIMULATOR INSERTION N/A 04/18/2017   Procedure: LUMBAR SPINAL CORD STIMULATOR INSERTION;  Surgeon: Clydell Hakim, MD;  Location: [place];  Service: Neurosurgery;  Laterality: N/A;  LUMBAR SPINAL CORD STIMULATOR INSERTION  . TENDON TRANSFER Right 08/29/2017   Procedure: right abductor pollicis longus transfer;  Surgeon: Daryll Brod, MD;  Location: [place];  Service: Orthopedics;  Laterality: Right;  . TOTAL KNEE ARTHROPLASTY Left 03/23/2015   Procedure: TOTAL KNEE ARTHROPLASTY;  Surgeon: Newt Minion, MD;  Location: [place];  Service: Orthopedics;  Laterality: Left;  . TOTAL KNEE ARTHROPLASTY Right 08/15/2016   Procedure: RIGHT TOTAL KNEE ARTHROPLASTY, REMOVAL ACL SCREWS;  Surgeon: Newt Minion, MD;  Location: [place];  Service: Orthopedics;  Laterality: Right;  . TOTAL KNEE ARTHROPLASTY WITH HARDWARE REMOVAL Right   . VAGINAL HYSTERECTOMY    . VARICOSE VEIN SURGERY Right X 2    Family History  Problem Relation Age of Onset  . Heart disease Father   . Cancer Father   . Parkinson's disease Father   . Cancer Mother        skin cancer   . Myasthenia gravis Mother   . Heart disease Brother   . Cancer Brother   . Diabetes Brother   . Stroke Brother   . Heart disease Sister   . Heart attack Sister   . Cancer Maternal Grandfather   . Hypothyroidism Daughter   .  Hypertension Other   . Colon cancer Neg Hx     Social history:  reports that she quit smoking about 28 years ago. Her smoking use included cigarettes. She has a 6.00 pack-year smoking history. She has never used smokeless tobacco. She reports that she does not drink alcohol or use drugs.    Allergies  Allergen Reactions  . Lyrica [Pregabalin] Shortness Of Breath and Swelling    lower extremity edema and weight gain  . Belsomra [Suvorexant] Other (See Comments)    unknown  . Morphine And Related Itching    Upper torso  . Sulfamethoxazole-Trimethoprim Itching and Rash    Bactrim  . Tape Itching and Rash    Please use "paper" tape  Rash if left on longer than 24 hrs    Medications:  Prior to Admission medications   Medication Sig Start Date End Date Taking? Authorizing Provider  amoxicillin (AMOXIL) 500 MG capsule Take 2,000 mg by mouth See admin instructions. Take 2000 mg by mouth 1 hour prior to dental appointment   Yes [provider]  baclofen (LIORESAL) 10 MG tablet Take 10 mg by mouth 3 (three) times daily.  06/30/17  Yes [provider]  Cyanocobalamin (VITAMIN B-12) 5000 MCG SUBL Place 5,000 mcg under the tongue daily.    Yes [provider]  diclofenac sodium (VOLTAREN) 1 % GEL Apply 4 g topically 4 (four) times daily as needed (PAIN). 02/06/18  Yes Jessy Oto, MD  docusate sodium (COLACE) 100 MG capsule Take 100-200 mg by mouth 2 (two) times daily as needed for moderate constipation.    Yes [provider]  donepezil (ARICEPT) 10 MG tablet Take 1 tablet (10 mg total) by mouth at bedtime. 11/11/18  Yes Suzzanne Cloud, NP  DULoxetine (CYMBALTA) 60 MG capsule Take 60 mg by mouth 2 (two) times daily. 01/17/15  Yes [provider]  Erenumab-aooe (AIMOVIG) 140 MG/ML SOAJ Inject 140 mg into the skin every 30 (thirty) days. 11/11/18  Yes Suzzanne Cloud, NP  famotidine (PEPCID) 20 MG tablet Take 20 mg by mouth daily.   Yes [provider]  fluconazole (DIFLUCAN) 150 MG tablet Take 150 mg by mouth daily as needed (yeast infection).    Yes [provider]  folic acid (FOLVITE) 832 MCG tablet Take 400 mcg by mouth daily.   Yes [provider]  gabapentin (NEURONTIN) 800 MG tablet Take 1 tablet (800 mg total) by mouth 3 (three) times daily. 01/08/18  Yes [name], MD  Krill Oil 350 MG CAPS Take 350 mg by mouth at bedtime.    Yes [provider]  L-Methylfolate-B12-B6-B2 (METAFOLBIC) 06-22-48-5 MG TABS Take 1 tablet by mouth daily. Patient taking differently: Take 1 tablet by mouth at bedtime.  10/28/18  Yes Suzzanne Cloud, NP  levothyroxine (SYNTHROID) 150 MCG tablet Take 150 mcg by mouth daily before breakfast.   Yes [provider]  methylphenidate (CONCERTA) 27 MG PO CR tablet Take 1 tablet (27 mg total) by mouth 2 (two) times daily. 01/21/19  Yes Hurst, Teresa T, PA-C  methylphenidate 27 MG PO TB24 TAKE 1 TABLET BY MOUTH TWICE DAILY 04/06/19  Yes [provider]  mirabegron ER (MYRBETRIQ) 50 MG TB24 tablet Take 50 mg by mouth daily.   Yes [provider]  Multiple Minerals-Vitamins (CAL-MAG-ZINC-D PO) Take 3 tablets by mouth daily.    Yes [provider]  Multiple Vitamins-Minerals (ONE-A-DAY WOMENS PETITES PO) Take 1 tablet by mouth 2 (two) times daily.    Yes [provider]  NARCAN 4 MG/0.1ML LIQD nasal spray kit Place 0.4 mg into the nose once.  11/14/16  Yes [provider]  omeprazole (PRILOSEC) 40 MG capsule Take 1 capsule (40 mg total) by mouth daily. 02/23/19  Yes Laurine Blazer A, PA-C  ondansetron (ZOFRAN-ODT) 4 MG disintegrating tablet Take 4 mg by mouth every 8 (eight) hours as needed for nausea or vomiting.   Yes [provider]  rivaroxaban (XARELTO) 20 MG TABS tablet Take 1 tablet (20 mg total) by mouth at bedtime. 11/15/18  Yes Rehman, Mechele Dawley, MD  solifenacin (VESICARE) 5 MG tablet  03/15/19  Yes [provider]  tapentadol (NUCYNTA) 50 MG  tablet Take by mouth. 05/01/19 07/30/19 Yes [provider]  topiramate (TOPAMAX) 100 MG tablet Take 1 tablet (100 mg total) by mouth 2 (two) times daily. 04/06/19  Yes Suzzanne Cloud, NP  traZODone (DESYREL) 100 MG tablet Take 200 mg by mouth at bedtime.    Yes [provider]  vitamin C (ASCORBIC ACID) 500 MG tablet Take 500 mg by mouth daily.   Yes [provider]  VITAMIN D PO Take 5,000 Units by mouth.   Yes [provider]    ROS:  Out of a complete 14 system review of symptoms, the patient complains only of the following symptoms, and all other reviewed systems are negative.  Headache Back pain, hip pain Memory problems  Blood pressure 102/68, pulse 78, temperature 97.7 F (36.5 C), weight 204 lb 8 oz (92.8 kg).  Physical Exam  General: The patient is alert and cooperative at the time of the examination.  The patient is mildly obese.  Skin: No significant peripheral edema is noted.   Neurologic Exam  Mental status: The patient is alert and oriented x 3 at the time of the examination. The patient has apparent normal recent and remote memory, with an apparently normal attention span and concentration ability.  Mini-Mental status examination done today shows a total score of 29/30.  The patient is able to name 8 four-legged animals in 60 seconds.   Cranial nerves: Facial symmetry is present. Speech is normal, no aphasia or dysarthria is noted. Extraocular movements are full. Visual fields are full with exception of a right superior quadrantanopsia.  Motor: The patient has good strength in all 4 extremities.  Sensory examination: Soft touch sensation is symmetric on the face, arms, and legs.  Coordination: The patient has good finger-nose-finger and heel-to-shin bilaterally.  Gait and station: The patient has a normal gait. Tandem gait is unsteady. Romberg is negative. No drift is seen.  Reflexes:  Deep tendon reflexes are symmetric.   Assessment/Plan:  1.  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  History of left temporal/occipital stroke  3.  History of seizures, well controlled  4.  History of migraine headache, poorly controlled  5.  Fibromyalgia  The patient is on Aimovig for her migraines which she believes has helped some, bright lights are a significant activator for her.  The patient is taking Tylenol or Advil for her headaches, she claims that she should not be on Tylenol due to liver issues.  She should not be on Advil because of the fact that she is on anticoagulation therapy.  I will therefore prescribe Nurtec to take as needed for the headache.  She cannot take triptan medications because of prior history of stroke.  A prescription for her Aricept and for her B complex vitamins were sent in.  She will follow-up in 6 months.  Jill Alexanders MD 05/19/2019 11:08 AM  [facility] [address] [place], [place] [address]  Phone [phone] Fax [phone]

## 2019-05-20 ENCOUNTER — Telehealth: Payer: Self-pay

## 2019-05-20 NOTE — Telephone Encounter (Signed)
Nurtec done on cover my meds.[facility] has received your information, and the request will be reviewed. You may close this dialog, return to your dashboard, and perform other tasks.  You will receive an electronic determination in CoverMyMeds. You can see the latest determination by locating this request on your dashboard or by reopening this request. You will also receive a faxed copy of the determination. If you have any questions please contact [facility] at [phone].  If you need assistance, please chat with CoverMyMeds or call us at [phone]

## 2019-05-21 ENCOUNTER — Ambulatory Visit: Payer: Self-pay

## 2019-05-21 ENCOUNTER — Encounter: Payer: Self-pay | Admitting: Physical Medicine and Rehabilitation

## 2019-05-21 ENCOUNTER — Other Ambulatory Visit: Payer: Self-pay

## 2019-05-21 ENCOUNTER — Ambulatory Visit: Payer: PPO | Admitting: Physical Medicine and Rehabilitation

## 2019-05-21 VITALS — BP 99/64 | HR 62 | Ht 65.0 in | Wt 204.6 lb

## 2019-05-21 DIAGNOSIS — M5416 Radiculopathy, lumbar region: Secondary | ICD-10-CM

## 2019-05-21 MED ORDER — METHYLPREDNISOLONE ACETATE 80 MG/ML IJ SUSP
40.0000 mg | Freq: Once | INTRAMUSCULAR | Status: AC
  Administered 2019-05-21: 40 mg

## 2019-05-21 NOTE — Progress Notes (Addendum)
Debbie Pineda - 59 y.o. female MRN [id number]  Date of birth: [birth year]  Office Visit Note: Visit Date: 05/21/2019 PCP: Jake Samples, PA-C Referred by: Jake Samples, PA*  Subjective: Chief Complaint  Patient presents with  . Lower Back - Pain   HPI:  Debbie Pineda is a 59 y.o. female who comes in today for planned Left L5 transforaminal lumbar epidural steroid injection with fluoroscopic guidance.  The patient has failed conservative care including home exercise, medications, time and activity modification.  This injection will be diagnostic and hopefully therapeutic.  Please see requesting physician notes for further details and justification.   ROS Otherwise per HPI.  Assessment & Plan: Visit Diagnoses:  1. Lumbar radiculopathy     Plan: No additional findings.   Meds & Orders:  Meds ordered this encounter  Medications  . methylPREDNISolone acetate (DEPO-MEDROL) injection 40 mg    Orders Placed This Encounter  Procedures  . XR C-ARM NO REPORT  . Epidural Steroid injection    Follow-up: Return for visit to requesting physician as needed.   Procedures: No procedures performed  Lumbosacral Transforaminal Epidural Steroid Injection - Sub-Pedicular Approach with Fluoroscopic Guidance  Patient: Debbie Pineda      Date of Birth: Dec 27, 1960 MRN: [id number] PCP: Jake Samples, PA-C      Visit Date: 05/21/2019   Universal Protocol:    Date/Time: 05/21/2019  Consent Given By: the patient  Position: PRONE  Additional Comments: Vital signs were monitored before and after the procedure. Patient was prepped and draped in the usual sterile fashion. The correct patient, procedure, and site was verified.   Injection Procedure Details:  Procedure Site One Meds Administered:  Meds ordered this encounter  Medications  . methylPREDNISolone acetate (DEPO-MEDROL) injection 40 mg    Laterality: Left  Location/Site:  L5-S1  Needle size: 22  G  Needle type: Spinal  Needle Placement: Transforaminal  Findings:    -Comments: Excellent flow of contrast along the nerve and into the epidural space.  Procedure Details: After squaring off the end-plates to get a true AP view, the C-arm was positioned so that an oblique view of the foramen as noted above was visualized. The target area is just inferior to the "nose of the scotty dog" or sub pedicular. The soft tissues overlying this structure were infiltrated with 2-3 ml. of 1% Lidocaine without Epinephrine.  The spinal needle was inserted toward the target using a "trajectory" view along the fluoroscope beam.  Under AP and lateral visualization, the needle was advanced so it did not puncture dura and was located close the 6 O'Clock position of the pedical in AP tracterory. Biplanar projections were used to confirm position. Aspiration was confirmed to be negative for CSF and/or blood. A 1-2 ml. volume of Isovue-250 was injected and flow of contrast was noted at each level. Radiographs were obtained for documentation purposes.   After attaining the desired flow of contrast documented above, a 0.5 to 1.0 ml test dose of 0.25% Marcaine was injected into each respective transforaminal space.  The patient was observed for 90 seconds post injection.  After no sensory deficits were reported, and normal lower extremity motor function was noted,   the above injectate was administered so that equal amounts of the injectate were placed at each foramen (level) into the transforaminal epidural space.   Additional Comments:  The patient tolerated the procedure well Dressing: 2 x 2 sterile gauze and Band-Aid    Post-procedure details:  Patient was observed during the procedure. Post-procedure instructions were reviewed.  Patient left the clinic in stable condition.      Clinical History: Lumbar CT myelogram 04/06/2016  IMPRESSION: 1. No acute or focal abnormality to explain the  patient's thoracolumbar pain 2. Left paramedian calcified disc protrusion at T12-L1 without significant stenosis. 3. Mild osseous foraminal narrowing bilaterally at T10-11. 4. Mild bilateral foraminal narrowing at L1-2. 5. Mild left subarticular narrowing at L2-3 with moderate left and mild right foraminal stenosis. 6. Mild right subarticular narrowing at L3-4 with mild foraminal narrowing bilaterally, right greater than left. 7. Mild foraminal narrowing at L4-5 with moderate bilateral facet hypertrophy but no significant central canal stenosis. 8. Moderate facet hypertrophy and spurring is worse on the left with mild left subarticular narrowing potentially impacting the left S1 nerve root.  Lumbar spine MRI 01/09/2016  L2-3: Mild retrolisthesis. Mild disc and facet degeneration. Mild foraminal narrowing bilaterally.  L3-4: Mild retrolisthesis. Mild disc and facet degeneration. Mild right foraminal narrowing.  L4-5:  Mild disc and facet degeneration without significant stenosis  L5-S1: Moderate to advanced disc degeneration with disc space narrowing. Mild endplate spurring without significant stenosis.  IMPRESSION: Lumbar disc and facet degeneration is stable since 07/20/2014. Negative for disc protrusion or significant spinal stenosis. Mild right foraminal narrowing at L3-4.     Objective:  VS:  HT:5\' 5"  (165.1 cm)   WT:204 lb 9.6 oz (92.8 kg)  BMI:34.05    BP:99/64  HR:62bpm  TEMP: ( )  RESP:97 % Physical Exam  Ortho Exam Imaging: Epidural Steroid injection  Result Date: 05/21/2019 Magnus Sinning, MD     05/21/2019  1:28 PM Lumbosacral Transforaminal Epidural Steroid Injection - Sub-Pedicular Approach with Fluoroscopic Guidance Patient: Debbie Pineda     Date of Birth: 05-Jan-1961 MRN: [id number] PCP: Jake Samples, PA-C     Visit Date: 05/21/2019  Universal Protocol:   Date/Time: 05/21/2019 Consent Given By: the patient Position: PRONE Additional Comments:  Vital signs were monitored before and after the procedure. Patient was prepped and draped in the usual sterile fashion. The correct patient, procedure, and site was verified. Injection Procedure Details: Procedure Site One Meds Administered: Meds ordered this encounter Medications . methylPREDNISolone acetate (DEPO-MEDROL) injection 40 mg Laterality: Left Location/Site: L5-S1 Needle size: 22 G Needle type: Spinal Needle Placement: Transforaminal Findings:   -Comments: Excellent flow of contrast along the nerve and into the epidural space. Procedure Details: After squaring off the end-plates to get a true AP view, the C-arm was positioned so that an oblique view of the foramen as noted above was visualized. The target area is just inferior to the "nose of the scotty dog" or sub pedicular. The soft tissues overlying this structure were infiltrated with 2-3 ml. of 1% Lidocaine without Epinephrine. The spinal needle was inserted toward the target using a "trajectory" view along the fluoroscope beam.  Under AP and lateral visualization, the needle was advanced so it did not puncture dura and was located close the 6 O'Clock position of the pedical in AP tracterory. Biplanar projections were used to confirm position. Aspiration was confirmed to be negative for CSF and/or blood. A 1-2 ml. volume of Isovue-250 was injected and flow of contrast was noted at each level. Radiographs were obtained for documentation purposes. After attaining the desired flow of contrast documented above, a 0.5 to 1.0 ml test dose of 0.25% Marcaine was injected into each respective transforaminal space.  The patient was observed for 90 seconds post injection.  After no sensory deficits were reported, and normal lower extremity motor function was noted,   the above injectate was administered so that equal amounts of the injectate were placed at each foramen (level) into the transforaminal epidural space. Additional Comments: The patient tolerated  the procedure well Dressing: 2 x 2 sterile gauze and Band-Aid  Post-procedure details: Patient was observed during the procedure. Post-procedure instructions were reviewed. Patient left the clinic in stable condition.   XR C-ARM NO REPORT  Result Date: 05/21/2019 Please see Notes tab for imaging impression.

## 2019-05-21 NOTE — Procedures (Signed)
Lumbosacral Transforaminal Epidural Steroid Injection - Sub-Pedicular Approach with Fluoroscopic Guidance  Patient: Debbie Pineda      Date of Birth: 04/27/1960 MRN: [id number] PCP: Jake Samples, PA-C      Visit Date: 05/21/2019   Universal Protocol:    Date/Time: 05/21/2019  Consent Given By: the patient  Position: PRONE  Additional Comments: Vital signs were monitored before and after the procedure. Patient was prepped and draped in the usual sterile fashion. The correct patient, procedure, and site was verified.   Injection Procedure Details:  Procedure Site One Meds Administered:  Meds ordered this encounter  Medications  . methylPREDNISolone acetate (DEPO-MEDROL) injection 40 mg    Laterality: Left  Location/Site:  L5-S1  Needle size: 22 G  Needle type: Spinal  Needle Placement: Transforaminal  Findings:    -Comments: Excellent flow of contrast along the nerve and into the epidural space.  Procedure Details: After squaring off the end-plates to get a true AP view, the C-arm was positioned so that an oblique view of the foramen as noted above was visualized. The target area is just inferior to the "nose of the scotty dog" or sub pedicular. The soft tissues overlying this structure were infiltrated with 2-3 ml. of 1% Lidocaine without Epinephrine.  The spinal needle was inserted toward the target using a "trajectory" view along the fluoroscope beam.  Under AP and lateral visualization, the needle was advanced so it did not puncture dura and was located close the 6 O'Clock position of the pedical in AP tracterory. Biplanar projections were used to confirm position. Aspiration was confirmed to be negative for CSF and/or blood. A 1-2 ml. volume of Isovue-250 was injected and flow of contrast was noted at each level. Radiographs were obtained for documentation purposes.   After attaining the desired flow of contrast documented above, a 0.5 to 1.0 ml test dose  of 0.25% Marcaine was injected into each respective transforaminal space.  The patient was observed for 90 seconds post injection.  After no sensory deficits were reported, and normal lower extremity motor function was noted,   the above injectate was administered so that equal amounts of the injectate were placed at each foramen (level) into the transforaminal epidural space.   Additional Comments:  The patient tolerated the procedure well Dressing: 2 x 2 sterile gauze and Band-Aid    Post-procedure details: Patient was observed during the procedure. Post-procedure instructions were reviewed.  Patient left the clinic in stable condition.

## 2019-05-21 NOTE — Telephone Encounter (Signed)
Receive fax from [place] that nurtec 75mg  tablet was approve till 01/22/2020.Contact for [facility] was  [phone].

## 2019-05-21 NOTE — Progress Notes (Signed)
Numeric Pain Rating Scale and Functional Assessment Average Pain 5   In the last MONTH (on 0-10 scale) has pain interfered with the following?  1. General activity like being  able to carry out your everyday physical activities such as walking, climbing stairs, carrying groceries, or moving a chair?  Rating(5)  Lower back pain, left side. Radiating pain to left leg, stops at knee.   +Driver, +BT, -Dye Allergies.

## 2019-05-22 DIAGNOSIS — M1711 Unilateral primary osteoarthritis, right knee: Secondary | ICD-10-CM | POA: Diagnosis not present

## 2019-05-22 DIAGNOSIS — E063 Autoimmune thyroiditis: Secondary | ICD-10-CM | POA: Diagnosis not present

## 2019-05-22 DIAGNOSIS — E7849 Other hyperlipidemia: Secondary | ICD-10-CM | POA: Diagnosis not present

## 2019-05-22 DIAGNOSIS — M797 Fibromyalgia: Secondary | ICD-10-CM | POA: Diagnosis not present

## 2019-05-28 NOTE — Progress Notes (Deleted)
Office Visit Note  Patient: Debbie Pineda             Date of Birth: 02-10-1960           MRN: [id number]             PCP: Jake Samples, PA-C Referring: Jake Samples, [place]* Visit Date: 06/05/2019 Occupation: @GUAROCC @  Subjective:  No chief complaint on file.   History of Present Illness: Debbie Pineda is a 59 y.o. female ***   Activities of Daily Living:  Patient reports morning stiffness for *** {minute/hour:19697}.   Patient {ACTIONS;DENIES/REPORTS:21021675::"Denies"} nocturnal pain.  Difficulty dressing/grooming: {ACTIONS;DENIES/REPORTS:21021675::"Denies"} Difficulty climbing stairs: {ACTIONS;DENIES/REPORTS:21021675::"Denies"} Difficulty getting out of chair: {ACTIONS;DENIES/REPORTS:21021675::"Denies"} Difficulty using hands for taps, buttons, cutlery, and/or writing: {ACTIONS;DENIES/REPORTS:21021675::"Denies"}  No Rheumatology ROS completed.   PMFS History:  Patient Active Problem List   Diagnosis Date Noted  . Migraine 11/11/2018  . Abdominal pain, epigastric 10/14/2018  . LUQ pain 10/14/2018  . Attention deficit hyperactivity disorder (ADHD) 01/12/2018  . Insomnia 01/12/2018  . Elevated liver enzymes 09/10/2017  . History of diabetes mellitus 06/06/2017  . Primary osteoarthritis of right hand 06/06/2017  . Transaminasemia   . TIA (transient ischemic attack) 05/01/2017  . Dysphasia 05/01/2017  . Chronic pain syndrome 05/01/2017  . Confusion   . Presence of right artificial knee joint 09/17/2016  . H/O total knee replacement, right 08/15/2016  . Presence of retained hardware   . Memory disorder 08/14/2016  . Cerebrovascular disease 08/14/2016  . Unilateral primary osteoarthritis, right knee 05/29/2016  . Primary osteoarthritis of both feet 05/29/2016  . Trochanteric bursitis of both hips 05/25/2016  . Neck pain 05/25/2016  . Urinary urgency 04/19/2016  . Chronic low back pain 01/11/2016  . Depression 11/29/2015  . Total knee replacement  status 03/23/2015  . Heart palpitations 12/14/2013  . Generalized osteoarthritis of multiple sites 11/04/2013  . Cerebral infarction ([place]) 10/30/2011  . PFO (patent foramen ovale) 10/30/2011  . Bradycardia 10/30/2011  . Chest pain 10/30/2011  . Hypothyroid   . Thrombophlebitis   . Sleep apnea   . Fibromyalgia   . Status post bariatric surgery 12/07/2010  . Vein disorder 11/29/2010  . S/P total hysterectomy and bilateral salpingo-oophorectomy 11/29/2010  . S/P exploratory laparotomy 11/29/2010  . S/P cholecystectomy 11/29/2010  . S/P ACL surgery 11/29/2010  . Morbid obesity ([place]) 11/10/2010    Past Medical History:  Diagnosis Date  . ADD (attention deficit disorder)    takes Adderall daily  . Arthritis    "all over"  . Cerebral infarction ([place]) 10/30/2011  . Cerebrovascular disease 08/14/2016  . Chronic back pain    "all over"  . Chronic low back pain 01/11/2016  . Complication of anesthesia    tends to have hypotension when NPO and post-anesthesia  . Constipation    takes stool softener daily  . Degenerative disk disease   . Degenerative joint disease   . Depression    takes Cymbalta daily for pain per pt  . Diabetes mellitus without complication ([place])   . DVT (deep venous thrombosis) ([facility])    RLE  . Family history of adverse reaction to anesthesia    a family member woke up during surgery; "think it was my mom"  . Fibromyalgia   . Generalized osteoarthritis of multiple sites 11/04/2013  . GERD (gastroesophageal reflux disease)   . Heart palpitations 12/14/2013  . History of blood clots    superficial  . Hypoglycemia   . Hypothyroid  takes Synthroid daily  . Incomplete emptying of bladder   . Insomnia    takes Trazodone nightly  . Iron deficiency anemia    takes Ferrous Sulfate daily  . Joint pain   . Joint swelling    knees and ankles  . Memory disorder 08/14/2016  . Morbid obesity ([place])   . Nausea    takes Zofran as needed.Seeing GI doc  . Neck pain  05/25/2016  . OSA on CPAP    tested more than 5 yrs ago.    . Osteoarthritis   . PFO (patent foramen ovale)   . Primary osteoarthritis of both feet 05/29/2016   Right bunionectomy August 2017 by Dr. Sharol Given  . Scoliosis   . Sleep apnea   . Stroke [facility]) "several"   right foot weakness; memory issues, black spot right visual field since" (03/23/2015)  . Thrombophlebitis   . Trochanteric bursitis of both hips 05/25/2016  . Unilateral primary osteoarthritis, right knee 05/29/2016  . Urinary urgency 04/19/2016  . Vein disorder 11/29/2010    Family History  Problem Relation Age of Onset  . Heart disease Father   . Cancer Father   . Parkinson's disease Father   . Cancer Mother        skin cancer   . Myasthenia gravis Mother   . Heart disease Brother   . Cancer Brother   . Diabetes Brother   . Stroke Brother   . Heart disease Sister   . Heart attack Sister   . Cancer Maternal Grandfather   . Hypothyroidism Daughter   . Hypertension Other   . Colon cancer Neg Hx    Past Surgical History:  Procedure Laterality Date  . BIOPSY  11/14/2018   Procedure: BIOPSY;  Surgeon: Rogene Houston, MD;  [facility];  Service: Endoscopy;;  esophagus gastric  . BONE EXCISION Right 08/29/2017   Procedure: right trapezium excision;  Surgeon: Daryll Brod, MD;  Location: [place];  Service: Orthopedics;  Laterality: Right;  . BUNIONECTOMY Right 08/2015  . CARDIAC CATHETERIZATION     2008.  "it was fine" (not sure why she had it done, and doesn't know where)  . CARPOMETACARPEL SUSPENSION PLASTY Right 08/29/2017   Procedure: SUSPENSION PLASTY RIGHT THUMB;  Surgeon: Daryll Brod, MD;  Location: [place];  Service: Orthopedics;  Laterality: Right;  . COLONOSCOPY N/A 03/25/2013   Procedure: COLONOSCOPY;  Surgeon: Rogene Houston, MD;  [facility];  Service: Endoscopy;  Laterality: N/A;  930  . ESOPHAGOGASTRODUODENOSCOPY    . ESOPHAGOGASTRODUODENOSCOPY (EGD) WITH  PROPOFOL N/A 11/14/2018   Procedure: ESOPHAGOGASTRODUODENOSCOPY (EGD) WITH PROPOFOL;  Surgeon: Rogene Houston, MD;  [facility];  Service: Endoscopy;  Laterality: N/A;  1:55pm-office moved to 11:00am/pt notified to arrive at 9:30am per KF  . EXPLORATORY LAPAROTOMY     "took fallopian tubes out"  . JOINT REPLACEMENT     bil knee   . KNEE ARTHROPLASTY    . KNEE ARTHROSCOPY Left   . KNEE ARTHROSCOPY W/ ACL RECONSTRUCTION Right    "added pins"  . LAPAROSCOPIC CHOLECYSTECTOMY  ~ 2001  . 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 SPINAL CORD STIMULATOR INSERTION N/A 04/18/2017   Procedure: LUMBAR SPINAL CORD STIMULATOR INSERTION;  Surgeon: Clydell Hakim, MD;  Location: [place];  Service: Neurosurgery;  Laterality: N/A;  LUMBAR SPINAL CORD STIMULATOR INSERTION  . TENDON TRANSFER Right 08/29/2017   Procedure: right abductor pollicis longus transfer;  Surgeon: Daryll Brod,  MD;  Location: [place];  Service: Orthopedics;  Laterality: Right;  . TOTAL KNEE ARTHROPLASTY Left 03/23/2015   Procedure: TOTAL KNEE ARTHROPLASTY;  Surgeon: Newt Minion, MD;  Location: [place];  Service: Orthopedics;  Laterality: Left;  . TOTAL KNEE ARTHROPLASTY Right 08/15/2016   Procedure: RIGHT TOTAL KNEE ARTHROPLASTY, REMOVAL ACL SCREWS;  Surgeon: Newt Minion, MD;  Location: [place];  Service: Orthopedics;  Laterality: Right;  . TOTAL KNEE ARTHROPLASTY WITH HARDWARE REMOVAL Right   . VAGINAL HYSTERECTOMY    . VARICOSE VEIN SURGERY Right X 2   Social History   Social History Narrative   Lives with husband   Caffeine use: No soda   Mainly water, drinks decaf tea   Right handed   Immunization History  Administered Date(s) Administered  . Influenza Split 11/01/2011  . Influenza-Unspecified 08/23/2014, 11/22/2017     Objective: Vital Signs: There were no vitals taken for this visit.   Physical Exam   Musculoskeletal Exam: ***  CDAI Exam: CDAI Score: -- Patient Global: --; Provider Global:  -- Swollen: --; Tender: -- Joint Exam 06/05/2019   No joint exam has been documented for this visit   There is currently no information documented on the homunculus. Go to the Rheumatology activity and complete the homunculus joint exam.  Investigation: No additional findings.  Imaging: Epidural Steroid injection  Result Date: 05/21/2019 Magnus Sinning, MD     05/21/2019  1:28 PM Lumbosacral Transforaminal Epidural Steroid Injection - Sub-Pedicular Approach with Fluoroscopic Guidance Patient: Debbie Pineda     Date of Birth: December 03, 1960 MRN: [id number] PCP: Jake Samples, PA-C     Visit Date: 05/21/2019  Universal Protocol:   Date/Time: 05/21/2019 Consent Given By: the patient Position: PRONE Additional Comments: Vital signs were monitored before and after the procedure. Patient was prepped and draped in the usual sterile fashion. The correct patient, procedure, and site was verified. Injection Procedure Details: Procedure Site One Meds Administered: Meds ordered this encounter Medications . methylPREDNISolone acetate (DEPO-MEDROL) injection 40 mg Laterality: Left Location/Site: L5-S1 Needle size: 22 G Needle type: Spinal Needle Placement: Transforaminal Findings:   -Comments: Excellent flow of contrast along the nerve and into the epidural space. Procedure Details: After squaring off the end-plates to get a true AP view, the C-arm was positioned so that an oblique view of the foramen as noted above was visualized. The target area is just inferior to the "nose of the scotty dog" or sub pedicular. The soft tissues overlying this structure were infiltrated with 2-3 ml. of 1% Lidocaine without Epinephrine. The spinal needle was inserted toward the target using a "trajectory" view along the fluoroscope beam.  Under AP and lateral visualization, the needle was advanced so it did not puncture dura and was located close the 6 O'Clock position of the pedical in AP tracterory. Biplanar projections were  used to confirm position. Aspiration was confirmed to be negative for CSF and/or blood. A 1-2 ml. volume of Isovue-250 was injected and flow of contrast was noted at each level. Radiographs were obtained for documentation purposes. After attaining the desired flow of contrast documented above, a 0.5 to 1.0 ml test dose of 0.25% Marcaine was injected into each respective transforaminal space.  The patient was observed for 90 seconds post injection.  After no sensory deficits were reported, and normal lower extremity motor function was noted,   the above injectate was administered so that equal amounts of the injectate were placed at each foramen (level)  into the transforaminal epidural space. Additional Comments: The patient tolerated the procedure well Dressing: 2 x 2 sterile gauze and Band-Aid  Post-procedure details: Patient was observed during the procedure. Post-procedure instructions were reviewed. Patient left the clinic in stable condition.   XR C-ARM NO REPORT  Result Date: 05/21/2019 Please see Notes tab for imaging impression.   Recent Labs: Lab Results  Component Value Date   WBC 5.6 10/14/2018   HGB 13.4 10/14/2018   PLT 215 10/14/2018   NA 141 10/14/2018   K 4.5 10/14/2018   CL 108 10/14/2018   CO2 25 10/14/2018   GLUCOSE 99 10/14/2018   BUN 15 10/14/2018   CREATININE 0.92 10/14/2018   BILITOT 0.6 03/09/2019   ALKPHOS 83 03/09/2019   AST 31 03/09/2019   ALT 50 (H) 03/09/2019   PROT 6.5 03/09/2019   ALBUMIN 3.9 03/09/2019   CALCIUM 9.7 10/14/2018   GFRAA 81 10/14/2018    Speciality Comments: No specialty comments available.  Procedures:  No procedures performed Allergies: Lyrica [pregabalin], Belsomra [suvorexant], Morphine and related, Sulfamethoxazole-trimethoprim, and Tape   Assessment / Plan:     Visit Diagnoses: No diagnosis found.  Orders: No orders of the defined types were placed in this encounter.  No orders of the defined types were placed in this  encounter.   Face-to-face time spent with patient was *** minutes. Greater than 50% of time was spent in counseling and coordination of care.  Follow-Up Instructions: No follow-ups on file.   Ofilia Neas, PA-C  Note - This record has been created using Dragon software.  Chart creation errors have been sought, but may not always  have been located. Such creation errors do not reflect on  the standard of medical care.

## 2019-06-02 NOTE — Progress Notes (Deleted)
Office Visit Note  Patient: Debbie Pineda             Date of Birth: 1960/07/07           MRN: [id number]             PCP: Jake Samples, PA-C Referring: Jake Samples, [place]* Visit Date: 06/04/2019 Occupation: @GUAROCC @  Subjective:  No chief complaint on file.   History of Present Illness: Debbie Pineda is a 59 y.o. female ***   Activities of Daily Living:  Patient reports morning stiffness for *** {minute/hour:19697}.   Patient {ACTIONS;DENIES/REPORTS:21021675::"Denies"} nocturnal pain.  Difficulty dressing/grooming: {ACTIONS;DENIES/REPORTS:21021675::"Denies"} Difficulty climbing stairs: {ACTIONS;DENIES/REPORTS:21021675::"Denies"} Difficulty getting out of chair: {ACTIONS;DENIES/REPORTS:21021675::"Denies"} Difficulty using hands for taps, buttons, cutlery, and/or writing: {ACTIONS;DENIES/REPORTS:21021675::"Denies"}  No Rheumatology ROS completed.   PMFS History:  Patient Active Problem List   Diagnosis Date Noted  . Migraine 11/11/2018  . Abdominal pain, epigastric 10/14/2018  . LUQ pain 10/14/2018  . Attention deficit hyperactivity disorder (ADHD) 01/12/2018  . Insomnia 01/12/2018  . Elevated liver enzymes 09/10/2017  . History of diabetes mellitus 06/06/2017  . Primary osteoarthritis of right hand 06/06/2017  . Transaminasemia   . TIA (transient ischemic attack) 05/01/2017  . Dysphasia 05/01/2017  . Chronic pain syndrome 05/01/2017  . Confusion   . Presence of right artificial knee joint 09/17/2016  . H/O total knee replacement, right 08/15/2016  . Presence of retained hardware   . Memory disorder 08/14/2016  . Cerebrovascular disease 08/14/2016  . Unilateral primary osteoarthritis, right knee 05/29/2016  . Primary osteoarthritis of both feet 05/29/2016  . Trochanteric bursitis of both hips 05/25/2016  . Neck pain 05/25/2016  . Urinary urgency 04/19/2016  . Chronic low back pain 01/11/2016  . Depression 11/29/2015  . Total knee replacement  status 03/23/2015  . Heart palpitations 12/14/2013  . Generalized osteoarthritis of multiple sites 11/04/2013  . Cerebral infarction ([place]) 10/30/2011  . PFO (patent foramen ovale) 10/30/2011  . Bradycardia 10/30/2011  . Chest pain 10/30/2011  . Hypothyroid   . Thrombophlebitis   . Sleep apnea   . Fibromyalgia   . Status post bariatric surgery 12/07/2010  . Vein disorder 11/29/2010  . S/P total hysterectomy and bilateral salpingo-oophorectomy 11/29/2010  . S/P exploratory laparotomy 11/29/2010  . S/P cholecystectomy 11/29/2010  . S/P ACL surgery 11/29/2010  . Morbid obesity ([place]) 11/10/2010    Past Medical History:  Diagnosis Date  . ADD (attention deficit disorder)    takes Adderall daily  . Arthritis    "all over"  . Cerebral infarction ([place]) 10/30/2011  . Cerebrovascular disease 08/14/2016  . Chronic back pain    "all over"  . Chronic low back pain 01/11/2016  . Complication of anesthesia    tends to have hypotension when NPO and post-anesthesia  . Constipation    takes stool softener daily  . Degenerative disk disease   . Degenerative joint disease   . Depression    takes Cymbalta daily for pain per pt  . Diabetes mellitus without complication ([place])   . DVT (deep venous thrombosis) ([facility])    RLE  . Family history of adverse reaction to anesthesia    a family member woke up during surgery; "think it was my mom"  . Fibromyalgia   . Generalized osteoarthritis of multiple sites 11/04/2013  . GERD (gastroesophageal reflux disease)   . Heart palpitations 12/14/2013  . History of blood clots    superficial  . Hypoglycemia   . Hypothyroid  takes Synthroid daily  . Incomplete emptying of bladder   . Insomnia    takes Trazodone nightly  . Iron deficiency anemia    takes Ferrous Sulfate daily  . Joint pain   . Joint swelling    knees and ankles  . Memory disorder 08/14/2016  . Morbid obesity ([place])   . Nausea    takes Zofran as needed.Seeing GI doc  . Neck pain  05/25/2016  . OSA on CPAP    tested more than 5 yrs ago.    . Osteoarthritis   . PFO (patent foramen ovale)   . Primary osteoarthritis of both feet 05/29/2016   Right bunionectomy August 2017 by Dr. Sharol Given  . Scoliosis   . Sleep apnea   . Stroke [facility]) "several"   right foot weakness; memory issues, black spot right visual field since" (03/23/2015)  . Thrombophlebitis   . Trochanteric bursitis of both hips 05/25/2016  . Unilateral primary osteoarthritis, right knee 05/29/2016  . Urinary urgency 04/19/2016  . Vein disorder 11/29/2010    Family History  Problem Relation Age of Onset  . Heart disease Father   . Cancer Father   . Parkinson's disease Father   . Cancer Mother        skin cancer   . Myasthenia gravis Mother   . Heart disease Brother   . Cancer Brother   . Diabetes Brother   . Stroke Brother   . Heart disease Sister   . Heart attack Sister   . Cancer Maternal Grandfather   . Hypothyroidism Daughter   . Hypertension Other   . Colon cancer Neg Hx    Past Surgical History:  Procedure Laterality Date  . BIOPSY  11/14/2018   Procedure: BIOPSY;  Surgeon: Rogene Houston, MD;  [facility];  Service: Endoscopy;;  esophagus gastric  . BONE EXCISION Right 08/29/2017   Procedure: right trapezium excision;  Surgeon: Daryll Brod, MD;  Location: [place];  Service: Orthopedics;  Laterality: Right;  . BUNIONECTOMY Right 08/2015  . CARDIAC CATHETERIZATION     2008.  "it was fine" (not sure why she had it done, and doesn't know where)  . CARPOMETACARPEL SUSPENSION PLASTY Right 08/29/2017   Procedure: SUSPENSION PLASTY RIGHT THUMB;  Surgeon: Daryll Brod, MD;  Location: [place];  Service: Orthopedics;  Laterality: Right;  . COLONOSCOPY N/A 03/25/2013   Procedure: COLONOSCOPY;  Surgeon: Rogene Houston, MD;  [facility];  Service: Endoscopy;  Laterality: N/A;  930  . ESOPHAGOGASTRODUODENOSCOPY    . ESOPHAGOGASTRODUODENOSCOPY (EGD) WITH  PROPOFOL N/A 11/14/2018   Procedure: ESOPHAGOGASTRODUODENOSCOPY (EGD) WITH PROPOFOL;  Surgeon: Rogene Houston, MD;  [facility];  Service: Endoscopy;  Laterality: N/A;  1:55pm-office moved to 11:00am/pt notified to arrive at 9:30am per KF  . EXPLORATORY LAPAROTOMY     "took fallopian tubes out"  . JOINT REPLACEMENT     bil knee   . KNEE ARTHROPLASTY    . KNEE ARTHROSCOPY Left   . KNEE ARTHROSCOPY W/ ACL RECONSTRUCTION Right    "added pins"  . LAPAROSCOPIC CHOLECYSTECTOMY  ~ 2001  . 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 SPINAL CORD STIMULATOR INSERTION N/A 04/18/2017   Procedure: LUMBAR SPINAL CORD STIMULATOR INSERTION;  Surgeon: Clydell Hakim, MD;  Location: [place];  Service: Neurosurgery;  Laterality: N/A;  LUMBAR SPINAL CORD STIMULATOR INSERTION  . TENDON TRANSFER Right 08/29/2017   Procedure: right abductor pollicis longus transfer;  Surgeon: Daryll Brod,  MD;  Location: [place];  Service: Orthopedics;  Laterality: Right;  . TOTAL KNEE ARTHROPLASTY Left 03/23/2015   Procedure: TOTAL KNEE ARTHROPLASTY;  Surgeon: Newt Minion, MD;  Location: [place];  Service: Orthopedics;  Laterality: Left;  . TOTAL KNEE ARTHROPLASTY Right 08/15/2016   Procedure: RIGHT TOTAL KNEE ARTHROPLASTY, REMOVAL ACL SCREWS;  Surgeon: Newt Minion, MD;  Location: [place];  Service: Orthopedics;  Laterality: Right;  . TOTAL KNEE ARTHROPLASTY WITH HARDWARE REMOVAL Right   . VAGINAL HYSTERECTOMY    . VARICOSE VEIN SURGERY Right X 2   Social History   Social History Narrative   Lives with husband   Caffeine use: No soda   Mainly water, drinks decaf tea   Right handed   Immunization History  Administered Date(s) Administered  . Influenza Split 11/01/2011  . Influenza-Unspecified 08/23/2014, 11/22/2017     Objective: Vital Signs: There were no vitals taken for this visit.   Physical Exam   Musculoskeletal Exam: ***  CDAI Exam: CDAI Score: -- Patient Global: --; Provider Global:  -- Swollen: --; Tender: -- Joint Exam 06/04/2019   No joint exam has been documented for this visit   There is currently no information documented on the homunculus. Go to the Rheumatology activity and complete the homunculus joint exam.  Investigation: No additional findings.  Imaging: Epidural Steroid injection  Result Date: 05/21/2019 Magnus Sinning, MD     05/21/2019  1:28 PM Lumbosacral Transforaminal Epidural Steroid Injection - Sub-Pedicular Approach with Fluoroscopic Guidance Patient: Debbie Pineda     Date of Birth: Jul 16, 1960 MRN: [id number] PCP: Jake Samples, PA-C     Visit Date: 05/21/2019  Universal Protocol:   Date/Time: 05/21/2019 Consent Given By: the patient Position: PRONE Additional Comments: Vital signs were monitored before and after the procedure. Patient was prepped and draped in the usual sterile fashion. The correct patient, procedure, and site was verified. Injection Procedure Details: Procedure Site One Meds Administered: Meds ordered this encounter Medications . methylPREDNISolone acetate (DEPO-MEDROL) injection 40 mg Laterality: Left Location/Site: L5-S1 Needle size: 22 G Needle type: Spinal Needle Placement: Transforaminal Findings:   -Comments: Excellent flow of contrast along the nerve and into the epidural space. Procedure Details: After squaring off the end-plates to get a true AP view, the C-arm was positioned so that an oblique view of the foramen as noted above was visualized. The target area is just inferior to the "nose of the scotty dog" or sub pedicular. The soft tissues overlying this structure were infiltrated with 2-3 ml. of 1% Lidocaine without Epinephrine. The spinal needle was inserted toward the target using a "trajectory" view along the fluoroscope beam.  Under AP and lateral visualization, the needle was advanced so it did not puncture dura and was located close the 6 O'Clock position of the pedical in AP tracterory. Biplanar projections were  used to confirm position. Aspiration was confirmed to be negative for CSF and/or blood. A 1-2 ml. volume of Isovue-250 was injected and flow of contrast was noted at each level. Radiographs were obtained for documentation purposes. After attaining the desired flow of contrast documented above, a 0.5 to 1.0 ml test dose of 0.25% Marcaine was injected into each respective transforaminal space.  The patient was observed for 90 seconds post injection.  After no sensory deficits were reported, and normal lower extremity motor function was noted,   the above injectate was administered so that equal amounts of the injectate were placed at each foramen (level)  into the transforaminal epidural space. Additional Comments: The patient tolerated the procedure well Dressing: 2 x 2 sterile gauze and Band-Aid  Post-procedure details: Patient was observed during the procedure. Post-procedure instructions were reviewed. Patient left the clinic in stable condition.   XR C-ARM NO REPORT  Result Date: 05/21/2019 Please see Notes tab for imaging impression.   Recent Labs: Lab Results  Component Value Date   WBC 5.6 10/14/2018   HGB 13.4 10/14/2018   PLT 215 10/14/2018   NA 141 10/14/2018   K 4.5 10/14/2018   CL 108 10/14/2018   CO2 25 10/14/2018   GLUCOSE 99 10/14/2018   BUN 15 10/14/2018   CREATININE 0.92 10/14/2018   BILITOT 0.6 03/09/2019   ALKPHOS 83 03/09/2019   AST 31 03/09/2019   ALT 50 (H) 03/09/2019   PROT 6.5 03/09/2019   ALBUMIN 3.9 03/09/2019   CALCIUM 9.7 10/14/2018   GFRAA 81 10/14/2018    Speciality Comments: No specialty comments available.  Procedures:  No procedures performed Allergies: Lyrica [pregabalin], Belsomra [suvorexant], Morphine and related, Sulfamethoxazole-trimethoprim, and Tape   Assessment / Plan:     Visit Diagnoses: No diagnosis found.  Orders: No orders of the defined types were placed in this encounter.  No orders of the defined types were placed in this  encounter.   Face-to-face time spent with patient was *** minutes. Greater than 50% of time was spent in counseling and coordination of care.  Follow-Up Instructions: No follow-ups on file.   Ofilia Neas, PA-C  Note - This record has been created using Dragon software.  Chart creation errors have been sought, but may not always  have been located. Such creation errors do not reflect on  the standard of medical care.

## 2019-06-04 ENCOUNTER — Ambulatory Visit: Payer: PPO | Admitting: Specialist

## 2019-06-04 ENCOUNTER — Ambulatory Visit: Payer: PPO | Admitting: Rheumatology

## 2019-06-04 DIAGNOSIS — Z8672 Personal history of thrombophlebitis: Secondary | ICD-10-CM

## 2019-06-04 DIAGNOSIS — Z96653 Presence of artificial knee joint, bilateral: Secondary | ICD-10-CM

## 2019-06-04 DIAGNOSIS — Q211 Atrial septal defect: Secondary | ICD-10-CM

## 2019-06-04 DIAGNOSIS — Z8669 Personal history of other diseases of the nervous system and sense organs: Secondary | ICD-10-CM

## 2019-06-04 DIAGNOSIS — M797 Fibromyalgia: Secondary | ICD-10-CM

## 2019-06-04 DIAGNOSIS — M7062 Trochanteric bursitis, left hip: Secondary | ICD-10-CM

## 2019-06-04 DIAGNOSIS — M19071 Primary osteoarthritis, right ankle and foot: Secondary | ICD-10-CM

## 2019-06-04 DIAGNOSIS — Z8673 Personal history of transient ischemic attack (TIA), and cerebral infarction without residual deficits: Secondary | ICD-10-CM

## 2019-06-04 DIAGNOSIS — M19041 Primary osteoarthritis, right hand: Secondary | ICD-10-CM

## 2019-06-04 DIAGNOSIS — Z8639 Personal history of other endocrine, nutritional and metabolic disease: Secondary | ICD-10-CM

## 2019-06-04 DIAGNOSIS — Z9884 Bariatric surgery status: Secondary | ICD-10-CM

## 2019-06-04 DIAGNOSIS — M5134 Other intervertebral disc degeneration, thoracic region: Secondary | ICD-10-CM

## 2019-06-04 DIAGNOSIS — M5136 Other intervertebral disc degeneration, lumbar region: Secondary | ICD-10-CM

## 2019-06-05 ENCOUNTER — Ambulatory Visit: Payer: PPO | Admitting: Physician Assistant

## 2019-06-05 ENCOUNTER — Ambulatory Visit: Payer: PPO | Admitting: Rheumatology

## 2019-06-11 ENCOUNTER — Other Ambulatory Visit: Payer: Self-pay

## 2019-06-11 ENCOUNTER — Encounter: Payer: Self-pay | Admitting: Specialist

## 2019-06-11 ENCOUNTER — Ambulatory Visit: Payer: PPO | Admitting: Specialist

## 2019-06-11 VITALS — BP 126/79 | HR 74 | Ht 65.0 in | Wt 205.0 lb

## 2019-06-11 DIAGNOSIS — M4156 Other secondary scoliosis, lumbar region: Secondary | ICD-10-CM

## 2019-06-11 DIAGNOSIS — M4726 Other spondylosis with radiculopathy, lumbar region: Secondary | ICD-10-CM | POA: Diagnosis not present

## 2019-06-11 DIAGNOSIS — M79652 Pain in left thigh: Secondary | ICD-10-CM

## 2019-06-11 DIAGNOSIS — M4805 Spinal stenosis, thoracolumbar region: Secondary | ICD-10-CM | POA: Diagnosis not present

## 2019-06-11 NOTE — Patient Instructions (Signed)
Avoid bending, stooping and avoid lifting weights greater than 10 lbs. Avoid prolong standing and walking. Avoid frequent bending and stooping  No lifting greater than 10 lbs. May use ice or moist heat for pain. Weight loss is of benefit. Handicap license is approved. Dr. Newton's secretary/Assistant will call to arrange for epidural steroid injection  

## 2019-06-11 NOTE — Progress Notes (Signed)
Office Visit Note  Patient: Debbie Pineda             Date of Birth: 1961-01-03           MRN: [id number]             PCP: Jake Samples, PA-C Referring: Jake Samples, [place]* Visit Date: 06/23/2019 Occupation: @GUAROCC @  Subjective:  Increased pain.   History of Present Illness: Debbie Pineda is a 59 y.o. female with history of fibromyalgia, osteoarthritis and degenerative disc disease.  She states she has intermittent flares of fibromyalgia with increased pain.  She has been also experiencing pain in her bilateral trochanteric area and her lower back.  She has had one epidural injection.  She states she will be getting another one soon.  She has some discomfort in her hands.  She had bilateral total knee replacement which is also causing discomfort.  Is experiencing pain in the trapezius area and would like to have injections.  Activities of Daily Living:  Patient reports morning stiffness for a couple hours.   Patient Reports nocturnal pain.  Difficulty dressing/grooming: Reports Difficulty climbing stairs: Reports Difficulty getting out of chair: Reports Difficulty using hands for taps, buttons, cutlery, and/or writing: Reports  Review of Systems  Constitutional: Positive for fatigue. Negative for night sweats, weight gain and weight loss.  HENT: Positive for mouth dryness. Negative for mouth sores, trouble swallowing, trouble swallowing and nose dryness.   Eyes: Positive for dryness. Negative for pain, redness, itching and visual disturbance.  Respiratory: Negative for cough, shortness of breath and difficulty breathing.   Cardiovascular: Negative for chest pain, palpitations, hypertension, irregular heartbeat and swelling in legs/feet.  Gastrointestinal: Negative for blood in stool, constipation and diarrhea.  Endocrine: Negative for increased urination.  Genitourinary: Negative for difficulty urinating and vaginal dryness.  Musculoskeletal: Positive for  arthralgias, joint pain, myalgias, morning stiffness, muscle tenderness and myalgias. Negative for joint swelling and muscle weakness.  Skin: Negative for color change, rash, hair loss, skin tightness, ulcers and sensitivity to sunlight.  Allergic/Immunologic: Negative for susceptible to infections.  Neurological: Positive for headaches and memory loss. Negative for dizziness, numbness, night sweats and weakness.  Hematological: Negative for bruising/bleeding tendency and swollen glands.  Psychiatric/Behavioral: Negative for depressed mood, confusion and sleep disturbance. The patient is not nervous/anxious.     PMFS History:  Patient Active Problem List   Diagnosis Date Noted  . Migraine 11/11/2018  . Abdominal pain, epigastric 10/14/2018  . LUQ pain 10/14/2018  . Attention deficit hyperactivity disorder (ADHD) 01/12/2018  . Insomnia 01/12/2018  . Elevated liver enzymes 09/10/2017  . History of diabetes mellitus 06/06/2017  . Primary osteoarthritis of right hand 06/06/2017  . Transaminasemia   . TIA (transient ischemic attack) 05/01/2017  . Dysphasia 05/01/2017  . Chronic pain syndrome 05/01/2017  . Confusion   . Presence of right artificial knee joint 09/17/2016  . H/O total knee replacement, right 08/15/2016  . Presence of retained hardware   . Memory disorder 08/14/2016  . Cerebrovascular disease 08/14/2016  . Unilateral primary osteoarthritis, right knee 05/29/2016  . Primary osteoarthritis of both feet 05/29/2016  . Trochanteric bursitis of both hips 05/25/2016  . Neck pain 05/25/2016  . Urinary urgency 04/19/2016  . Chronic low back pain 01/11/2016  . Depression 11/29/2015  . 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 Heart palpitations 12/14/2013  . Generalized osteoarthritis of multiple sites 11/04/2013  . Cerebral infarction ([place]) 10/30/2011  . PFO (patent foramen ovale)  10/30/2011  . Bradycardia 10/30/2011  . Chest pain 10/30/2011  . Hypothyroid   .  Thrombophlebitis   . Sleep apnea   . Fibromyalgia   . Status post bariatric surgery 12/07/2010  . Vein disorder 11/29/2010  . S/P total hysterectomy and bilateral salpingo-oophorectomy 11/29/2010  . S/P exploratory laparotomy 11/29/2010  . S/P cholecystectomy 11/29/2010  . S/P ACL surgery 11/29/2010  . Morbid obesity ([place]) 11/10/2010    Past Medical History:  Diagnosis Date  . ADD (attention deficit disorder)    takes Adderall daily  . Arthritis    "all over"  . Cerebral infarction ([place]) 10/30/2011  . Cerebrovascular disease 08/14/2016  . Chronic back pain    "all over"  . Chronic low back pain 01/11/2016  . Complication of anesthesia    tends to have hypotension when NPO and post-anesthesia  . Constipation    takes stool softener daily  . Degenerative disk disease   . Degenerative joint disease   . Depression    takes Cymbalta daily for pain per pt  . Diabetes mellitus without complication ([place])   . DVT (deep venous thrombosis) (HCC)    RLE  . Family history of adverse reaction to anesthesia    a family member woke up during surgery; "think it was my mom"  . Fibromyalgia   . Generalized osteoarthritis of multiple sites 11/04/2013  . GERD (gastroesophageal reflux disease)   . Heart palpitations 12/14/2013  . History of blood clots    superficial  . Hypoglycemia   . Hypothyroid    takes Synthroid daily  . Incomplete emptying of bladder   . Insomnia    takes Trazodone nightly  . Iron deficiency anemia    takes Ferrous Sulfate daily  . Joint pain   . Joint swelling    knees and ankles  . Memory disorder 08/14/2016  . Morbid obesity ([place])   . Nausea    takes Zofran as needed.Seeing GI doc  . Neck pain 05/25/2016  . OSA on CPAP    tested more than 5 yrs ago.    . Osteoarthritis   . PFO (patent foramen ovale)   . Primary osteoarthritis of both feet 05/29/2016   Right bunionectomy August 2017 by Dr. Sharol Given  . Scoliosis   . Sleep apnea   . Stroke [facility]) "several"    right foot weakness; memory issues, black spot right visual field since" (03/23/2015)  . Thrombophlebitis   . Trochanteric bursitis of both hips 05/25/2016  . Unilateral primary osteoarthritis, right knee 05/29/2016  . Urinary urgency 04/19/2016  . Vein disorder 11/29/2010    Family History  Problem Relation Age of Onset  . Heart disease Father   . Cancer Father   . Parkinson's disease Father   . Cancer Mother        skin cancer   . Myasthenia gravis Mother   . Heart disease Brother   . Cancer Brother   . Diabetes Brother   . Stroke Brother   . Heart disease Sister   . Heart attack Sister   . Cancer Maternal Grandfather   . Hypothyroidism Daughter   . Hypertension Other   . Colon cancer Neg Hx    Past Surgical History:  Procedure Laterality Date  . BIOPSY  11/14/2018   Procedure: BIOPSY;  Surgeon: Rogene Houston, MD;  [facility];  Service: Endoscopy;;  esophagus gastric  . BONE EXCISION Right 08/29/2017   Procedure: right trapezium excision;  Surgeon: Daryll Brod, MD;  Location: [place];  Service: Orthopedics;  Laterality: Right;  . BUNIONECTOMY Right 08/2015  . CARDIAC CATHETERIZATION     2008.  "it was fine" (not sure why she had it done, and doesn't know where)  . CARPOMETACARPEL SUSPENSION PLASTY Right 08/29/2017   Procedure: SUSPENSION PLASTY RIGHT THUMB;  Surgeon: Daryll Brod, MD;  Location: [place];  Service: Orthopedics;  Laterality: Right;  . COLONOSCOPY N/A 03/25/2013   Procedure: COLONOSCOPY;  Surgeon: Rogene Houston, MD;  [facility];  Service: Endoscopy;  Laterality: N/A;  930  . ESOPHAGOGASTRODUODENOSCOPY    . ESOPHAGOGASTRODUODENOSCOPY (EGD) WITH PROPOFOL N/A 11/14/2018   Procedure: ESOPHAGOGASTRODUODENOSCOPY (EGD) WITH PROPOFOL;  Surgeon: Rogene Houston, MD;  [facility];  Service: Endoscopy;  Laterality: N/A;  1:55pm-office moved to 11:00am/pt notified to arrive at 9:30am per KF  . EXPLORATORY  LAPAROTOMY     "took fallopian tubes out"  . JOINT REPLACEMENT     bil knee   . KNEE ARTHROPLASTY    . KNEE ARTHROSCOPY Left   . KNEE ARTHROSCOPY W/ ACL RECONSTRUCTION Right    "added pins"  . LAPAROSCOPIC CHOLECYSTECTOMY  ~ 2001  . 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 SPINAL CORD STIMULATOR INSERTION N/A 04/18/2017   Procedure: LUMBAR SPINAL CORD STIMULATOR INSERTION;  Surgeon: Clydell Hakim, MD;  Location: [place];  Service: Neurosurgery;  Laterality: N/A;  LUMBAR SPINAL CORD STIMULATOR INSERTION  . TENDON TRANSFER Right 08/29/2017   Procedure: right abductor pollicis longus transfer;  Surgeon: Daryll Brod, MD;  Location: [place];  Service: Orthopedics;  Laterality: Right;  . TOTAL KNEE ARTHROPLASTY Left 03/23/2015   Procedure: TOTAL KNEE ARTHROPLASTY;  Surgeon: Newt Minion, MD;  Location: [place];  Service: Orthopedics;  Laterality: Left;  . TOTAL KNEE ARTHROPLASTY Right 08/15/2016   Procedure: RIGHT TOTAL KNEE ARTHROPLASTY, REMOVAL ACL SCREWS;  Surgeon: Newt Minion, MD;  Location: [place];  Service: Orthopedics;  Laterality: Right;  . TOTAL KNEE ARTHROPLASTY WITH HARDWARE REMOVAL Right   . VAGINAL HYSTERECTOMY    . VARICOSE VEIN SURGERY Right X 2   Social History   Social History Narrative   Lives with husband   Caffeine use: No soda   Mainly water, drinks decaf tea   Right handed   Immunization History  Administered Date(s) Administered  . Influenza Split 11/01/2011  . Influenza-Unspecified 08/23/2014, 11/22/2017     Objective: Vital Signs: BP 117/74 (BP Location: Left Arm, Patient Position: Sitting, Cuff Size: Large)   Pulse 66   Resp 14   Ht 5\' 5"  (1.651 m)   Wt 205 lb 9.6 oz (93.3 kg)   BMI 34.21 kg/m    Physical Exam Vitals and nursing note reviewed.  Constitutional:      Appearance: She is well-developed.  HENT:     Head: Normocephalic and atraumatic.  Eyes:     Conjunctiva/sclera: Conjunctivae normal.  Cardiovascular:     Rate and Rhythm:  Normal rate and regular rhythm.     Heart sounds: Normal heart sounds.  Pulmonary:     Effort: Pulmonary effort is normal.     Breath sounds: Normal breath sounds.  Abdominal:     General: Bowel sounds are normal.     Palpations: Abdomen is soft.  Musculoskeletal:     Cervical back: Normal range of motion.  Lymphadenopathy:     Cervical: No cervical adenopathy.  Skin:    General: Skin is warm and dry.  Capillary Refill: Capillary refill takes less than 2 seconds.  Neurological:     Mental Status: She is alert and oriented to person, place, and time.  Psychiatric:        Behavior: Behavior normal.      Musculoskeletal Exam: C-spine was in good range of motion.  She has limited painful range of motion of thoracic and lumbar spine.  Shoulder joints, elbow joints, wrist joints with good range of motion.  She has bilateral DIP and PIP thickening.  Hip joints in good range of motion.  She has tenderness on palpation of her left trochanteric bursa.  Both knee joints are replaced.  CDAI Exam: CDAI Score: -- Patient Global: --; Provider Global: -- Swollen: --; Tender: -- Joint Exam 06/23/2019   No joint exam has been documented for this visit   There is currently no information documented on the homunculus. Go to the Rheumatology activity and complete the homunculus joint exam.  Investigation: No additional findings.  Imaging: No results found.  Recent Labs: Lab Results  Component Value Date   WBC 5.6 10/14/2018   HGB 13.4 10/14/2018   PLT 215 10/14/2018   NA 141 10/14/2018   K 4.5 10/14/2018   CL 108 10/14/2018   CO2 25 10/14/2018   GLUCOSE 99 10/14/2018   BUN 15 10/14/2018   CREATININE 0.92 10/14/2018   BILITOT 0.6 03/09/2019   ALKPHOS 83 03/09/2019   AST 31 03/09/2019   ALT 50 (H) 03/09/2019   PROT 6.5 03/09/2019   ALBUMIN 3.9 03/09/2019   CALCIUM 9.7 10/14/2018   GFRAA 81 10/14/2018    Speciality Comments: No specialty comments available.  Procedures:   Trigger Point Inj  Date/Time: 06/23/2019 2:42 PM Performed by: Bo Merino, MD Authorized by: Bo Merino, MD   Consent Given by:  Patient Site marked: the procedure site was marked   Timeout: prior to procedure the correct patient, procedure, and site was verified   Indications:  Muscle spasm and pain Total # of Trigger Points:  2 Location: neck   Needle Size:  27 G Approach:  Dorsal Medications #1:  0.5 mL lidocaine 1 %; 10 mg triamcinolone acetonide 40 MG/ML Medications #2:  0.5 mL lidocaine 1 %; 10 mg triamcinolone acetonide 40 MG/ML Patient tolerance:  Patient tolerated the procedure well with no immediate complications   Allergies: Lyrica [pregabalin], Belsomra [suvorexant], Morphine and related, Sulfamethoxazole-trimethoprim, and Tape   Assessment / Plan:     Visit Diagnoses: Fibromyalgia-patient continues to have some generalized pain and discomfort.  She had positive tender points.  Need for regular exercise was emphasized.  Trapezius muscle spasm-she had bilateral trapezius spasm.  Per her request bilateral trapezius area were injected with cortisone as described above.  She tolerated the procedure well.  Primary osteoarthritis of both hands-joint protection was discussed.  Trochanteric bursitis of both hips-she had tenderness on palpation over left trochanteric bursa.  IT band exercises were demonstrated in the office.  History of total bilateral knee replacement-doing fairly well.  Primary osteoarthritis of both feet-proper fitting shoes were emphasized.  DDD (degenerative disc disease), thoracic-she is really not having much discomfort.  DDD (degenerative disc disease), lumbar-she continues to have a lot of discomfort in her lower back.  She states she had a recent cortisone injection and she will be getting another one.  Other medical problems are listed as follows:  History of thrombophlebitis  History of diabetes mellitus-I have advised her to  monitor her blood sugar closely.  History of cerebral  infarction  History of gastric bypass  History of sleep apnea  PFO (patent foramen ovale)  Orders: Orders Placed This Encounter  Procedures  . Trigger Point Inj   No orders of the defined types were placed in this encounter.   .  Follow-Up Instructions: Return in about 6 months (around 12/23/2019) for Osteoarthritis, FMS.   Bo Merino, MD  Note - This record has been created using Editor, commissioning.  Chart creation errors have been sought, but may not always  have been located. Such creation errors do not reflect on  the standard of medical care.

## 2019-06-11 NOTE — Progress Notes (Signed)
Office Visit Note   Patient: Debbie Pineda           Date of Birth: Mar 06, 1960           MRN: [id number] Visit Date: 06/11/2019              Requested by: Jake Samples, PA-C [address] [place],  [place] [address] PCP: Jake Samples, PA-C   Assessment & Plan: Visit Diagnoses:  1. Other secondary scoliosis, lumbar region   2. Spinal stenosis of thoracolumbar region   3. Other spondylosis with radiculopathy, lumbar region   4. Left thigh pain     Plan: Avoid bending, stooping and avoid lifting weights greater than 10 lbs. Avoid prolong standing and walking. Avoid frequent bending and stooping  No lifting greater than 10 lbs. May use ice or moist heat for pain. Weight loss is of benefit. Handicap license is approved. Dr. Romona Curls secretary/Assistant will call to arrange for epidural steroid injection  Follow-Up Instructions: No follow-ups on file.   Orders:  No orders of the defined types were placed in this encounter.  No orders of the defined types were placed in this encounter.     Procedures: No procedures performed   Clinical Data: No additional findings.   Subjective: Chief Complaint  Patient presents with  . Lower Back - Follow-up    Had a Left L5-S1 TF injection with Dr. Ernestina Patches on 05/21/19, She states that it helped about 75%    HPI  Review of Systems   Objective: Vital Signs: BP 126/79 (BP Location: Left Arm, Patient Position: Sitting)   Pulse 74   Ht 5\' 5"  (1.651 m)   Wt 205 lb (93 kg)   BMI 34.11 kg/m   Physical Exam  Ortho Exam  Specialty Comments:  No specialty comments available.  Imaging: No results found.   PMFS History: Patient Active Problem List   Diagnosis Date Noted  . Migraine 11/11/2018  . Abdominal pain, epigastric 10/14/2018  . LUQ pain 10/14/2018  . Attention deficit hyperactivity disorder (ADHD) 01/12/2018  . Insomnia 01/12/2018  . Elevated liver enzymes 09/10/2017  . History of  diabetes mellitus 06/06/2017  . Primary osteoarthritis of right hand 06/06/2017  . Transaminasemia   . TIA (transient ischemic attack) 05/01/2017  . Dysphasia 05/01/2017  . Chronic pain syndrome 05/01/2017  . Confusion   . Presence of right artificial knee joint 09/17/2016  . H/O total knee replacement, right 08/15/2016  . Presence of retained hardware   . Memory disorder 08/14/2016  . Cerebrovascular disease 08/14/2016  . Unilateral primary osteoarthritis, right knee 05/29/2016  . Primary osteoarthritis of both feet 05/29/2016  . Trochanteric bursitis of both hips 05/25/2016  . Neck pain 05/25/2016  . Urinary urgency 04/19/2016  . Chronic low back pain 01/11/2016  . Depression 11/29/2015  . 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 Heart palpitations 12/14/2013  . Generalized osteoarthritis of multiple sites 11/04/2013  . Cerebral infarction ([place]) 10/30/2011  . PFO (patent foramen ovale) 10/30/2011  . Bradycardia 10/30/2011  . Chest pain 10/30/2011  . Hypothyroid   . Thrombophlebitis   . Sleep apnea   . Fibromyalgia   . Status post bariatric surgery 12/07/2010  . Vein disorder 11/29/2010  . S/P total hysterectomy and bilateral salpingo-oophorectomy 11/29/2010  . S/P exploratory laparotomy 11/29/2010  . S/P cholecystectomy 11/29/2010  . S/P ACL surgery 11/29/2010  . Morbid obesity ([place]) 11/10/2010   Past Medical History:  Diagnosis Date  . ADD (attention deficit  disorder)    takes Adderall daily  . Arthritis    "all over"  . Cerebral infarction ([place]) 10/30/2011  . Cerebrovascular disease 08/14/2016  . Chronic back pain    "all over"  . Chronic low back pain 01/11/2016  . Complication of anesthesia    tends to have hypotension when NPO and post-anesthesia  . Constipation    takes stool softener daily  . Degenerative disk disease   . Degenerative joint disease   . Depression    takes Cymbalta daily for pain per pt  . Diabetes mellitus without complication ([place])    . DVT (deep venous thrombosis) ([facility])    RLE  . Family history of adverse reaction to anesthesia    a family member woke up during surgery; "think it was my mom"  . Fibromyalgia   . Generalized osteoarthritis of multiple sites 11/04/2013  . GERD (gastroesophageal reflux disease)   . Heart palpitations 12/14/2013  . History of blood clots    superficial  . Hypoglycemia   . Hypothyroid    takes Synthroid daily  . Incomplete emptying of bladder   . Insomnia    takes Trazodone nightly  . Iron deficiency anemia    takes Ferrous Sulfate daily  . Joint pain   . Joint swelling    knees and ankles  . Memory disorder 08/14/2016  . Morbid obesity ([place])   . Nausea    takes Zofran as needed.Seeing GI doc  . Neck pain 05/25/2016  . OSA on CPAP    tested more than 5 yrs ago.    . Osteoarthritis   . PFO (patent foramen ovale)   . Primary osteoarthritis of both feet 05/29/2016   Right bunionectomy August 2017 by Dr. Sharol Given  . Scoliosis   . Sleep apnea   . Stroke [facility]) "several"   right foot weakness; memory issues, black spot right visual field since" (03/23/2015)  . Thrombophlebitis   . Trochanteric bursitis of both hips 05/25/2016  . Unilateral primary osteoarthritis, right knee 05/29/2016  . Urinary urgency 04/19/2016  . Vein disorder 11/29/2010    Family History  Problem Relation Age of Onset  . Heart disease Father   . Cancer Father   . Parkinson's disease Father   . Cancer Mother        skin cancer   . Myasthenia gravis Mother   . Heart disease Brother   . Cancer Brother   . Diabetes Brother   . Stroke Brother   . Heart disease Sister   . Heart attack Sister   . Cancer Maternal Grandfather   . Hypothyroidism Daughter   . Hypertension Other   . Colon cancer Neg Hx     Past Surgical History:  Procedure Laterality Date  . BIOPSY  11/14/2018   Procedure: BIOPSY;  Surgeon: Rogene Houston, MD;  [facility];  Service: Endoscopy;;  esophagus gastric  . BONE EXCISION  Right 08/29/2017   Procedure: right trapezium excision;  Surgeon: Daryll Brod, MD;  Location: [place];  Service: Orthopedics;  Laterality: Right;  . BUNIONECTOMY Right 08/2015  . CARDIAC CATHETERIZATION     2008.  "it was fine" (not sure why she had it done, and doesn't know where)  . CARPOMETACARPEL SUSPENSION PLASTY Right 08/29/2017   Procedure: SUSPENSION PLASTY RIGHT THUMB;  Surgeon: Daryll Brod, MD;  Location: [place];  Service: Orthopedics;  Laterality: Right;  . COLONOSCOPY N/A 03/25/2013   Procedure: COLONOSCOPY;  Surgeon: Bernadene Person  Gloriann Loan, MD;  [facility];  Service: Endoscopy;  Laterality: N/A;  930  . ESOPHAGOGASTRODUODENOSCOPY    . ESOPHAGOGASTRODUODENOSCOPY (EGD) WITH PROPOFOL N/A 11/14/2018   Procedure: ESOPHAGOGASTRODUODENOSCOPY (EGD) WITH PROPOFOL;  Surgeon: Rogene Houston, MD;  [facility];  Service: Endoscopy;  Laterality: N/A;  1:55pm-office moved to 11:00am/pt notified to arrive at 9:30am per KF  . EXPLORATORY LAPAROTOMY     "took fallopian tubes out"  . JOINT REPLACEMENT     bil knee   . KNEE ARTHROPLASTY    . KNEE ARTHROSCOPY Left   . KNEE ARTHROSCOPY W/ ACL RECONSTRUCTION Right    "added pins"  . LAPAROSCOPIC CHOLECYSTECTOMY  ~ 2001  . 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 SPINAL CORD STIMULATOR INSERTION N/A 04/18/2017   Procedure: LUMBAR SPINAL CORD STIMULATOR INSERTION;  Surgeon: Clydell Hakim, MD;  Location: [place];  Service: Neurosurgery;  Laterality: N/A;  LUMBAR SPINAL CORD STIMULATOR INSERTION  . TENDON TRANSFER Right 08/29/2017   Procedure: right abductor pollicis longus transfer;  Surgeon: Daryll Brod, MD;  Location: [place];  Service: Orthopedics;  Laterality: Right;  . TOTAL KNEE ARTHROPLASTY Left 03/23/2015   Procedure: TOTAL KNEE ARTHROPLASTY;  Surgeon: Newt Minion, MD;  Location: [place];  Service: Orthopedics;  Laterality: Left;  . TOTAL KNEE ARTHROPLASTY Right 08/15/2016    Procedure: RIGHT TOTAL KNEE ARTHROPLASTY, REMOVAL ACL SCREWS;  Surgeon: Newt Minion, MD;  Location: [place];  Service: Orthopedics;  Laterality: Right;  . TOTAL KNEE ARTHROPLASTY WITH HARDWARE REMOVAL Right   . VAGINAL HYSTERECTOMY    . VARICOSE VEIN SURGERY Right X 2   Social History   Occupational History  . Occupation: Disability  . Occupation: formerly Therapist, sports, [employer]  Tobacco Use  . Smoking status: Former Smoker    Packs/day: 0.75    Years: 8.00    Pack years: 6.00    Types: Cigarettes    Quit date: 12/01/1990    Years since quitting: 28.5  . Smokeless tobacco: Never Used  . Tobacco comment: quit smoking in the 1990s  Substance and Sexual Activity  . Alcohol use: No    Comment: 03/23/2015 "stopped drinking in 2012 w/gastric bypass; drank socially before bypass"  . Drug use: No  . Sexual activity: Not Currently    Birth control/protection: Surgical

## 2019-06-17 ENCOUNTER — Other Ambulatory Visit (INDEPENDENT_AMBULATORY_CARE_PROVIDER_SITE_OTHER): Payer: Self-pay | Admitting: Gastroenterology

## 2019-06-19 DIAGNOSIS — E119 Type 2 diabetes mellitus without complications: Secondary | ICD-10-CM | POA: Diagnosis not present

## 2019-06-19 DIAGNOSIS — J309 Allergic rhinitis, unspecified: Secondary | ICD-10-CM | POA: Diagnosis not present

## 2019-06-19 DIAGNOSIS — Z6834 Body mass index (BMI) 34.0-34.9, adult: Secondary | ICD-10-CM | POA: Diagnosis not present

## 2019-06-19 DIAGNOSIS — J069 Acute upper respiratory infection, unspecified: Secondary | ICD-10-CM | POA: Diagnosis not present

## 2019-06-19 DIAGNOSIS — E6609 Other obesity due to excess calories: Secondary | ICD-10-CM | POA: Diagnosis not present

## 2019-06-22 DIAGNOSIS — M797 Fibromyalgia: Secondary | ICD-10-CM | POA: Diagnosis not present

## 2019-06-22 DIAGNOSIS — E7849 Other hyperlipidemia: Secondary | ICD-10-CM | POA: Diagnosis not present

## 2019-06-22 DIAGNOSIS — E063 Autoimmune thyroiditis: Secondary | ICD-10-CM | POA: Diagnosis not present

## 2019-06-22 DIAGNOSIS — M1711 Unilateral primary osteoarthritis, right knee: Secondary | ICD-10-CM | POA: Diagnosis not present

## 2019-06-23 ENCOUNTER — Encounter: Payer: Self-pay | Admitting: Rheumatology

## 2019-06-23 ENCOUNTER — Ambulatory Visit: Payer: PPO | Admitting: Rheumatology

## 2019-06-23 ENCOUNTER — Other Ambulatory Visit: Payer: Self-pay

## 2019-06-23 VITALS — BP 117/74 | HR 66 | Resp 14 | Ht 65.0 in | Wt 205.6 lb

## 2019-06-23 DIAGNOSIS — Z96653 Presence of artificial knee joint, bilateral: Secondary | ICD-10-CM

## 2019-06-23 DIAGNOSIS — M5134 Other intervertebral disc degeneration, thoracic region: Secondary | ICD-10-CM

## 2019-06-23 DIAGNOSIS — M51369 Other intervertebral disc degeneration, lumbar region without mention of lumbar back pain or lower extremity pain: Secondary | ICD-10-CM

## 2019-06-23 DIAGNOSIS — Z9884 Bariatric surgery status: Secondary | ICD-10-CM

## 2019-06-23 DIAGNOSIS — Z8673 Personal history of transient ischemic attack (TIA), and cerebral infarction without residual deficits: Secondary | ICD-10-CM

## 2019-06-23 DIAGNOSIS — M19072 Primary osteoarthritis, left ankle and foot: Secondary | ICD-10-CM

## 2019-06-23 DIAGNOSIS — M19042 Primary osteoarthritis, left hand: Secondary | ICD-10-CM

## 2019-06-23 DIAGNOSIS — M19071 Primary osteoarthritis, right ankle and foot: Secondary | ICD-10-CM

## 2019-06-23 DIAGNOSIS — M19041 Primary osteoarthritis, right hand: Secondary | ICD-10-CM | POA: Diagnosis not present

## 2019-06-23 DIAGNOSIS — M7061 Trochanteric bursitis, right hip: Secondary | ICD-10-CM

## 2019-06-23 DIAGNOSIS — M5136 Other intervertebral disc degeneration, lumbar region: Secondary | ICD-10-CM | POA: Diagnosis not present

## 2019-06-23 DIAGNOSIS — Q2112 Patent foramen ovale: Secondary | ICD-10-CM

## 2019-06-23 DIAGNOSIS — M797 Fibromyalgia: Secondary | ICD-10-CM

## 2019-06-23 DIAGNOSIS — Z8639 Personal history of other endocrine, nutritional and metabolic disease: Secondary | ICD-10-CM

## 2019-06-23 DIAGNOSIS — Z8672 Personal history of thrombophlebitis: Secondary | ICD-10-CM

## 2019-06-23 DIAGNOSIS — M62838 Other muscle spasm: Secondary | ICD-10-CM | POA: Diagnosis not present

## 2019-06-23 DIAGNOSIS — Z8669 Personal history of other diseases of the nervous system and sense organs: Secondary | ICD-10-CM

## 2019-06-23 DIAGNOSIS — Q211 Atrial septal defect: Secondary | ICD-10-CM

## 2019-06-23 DIAGNOSIS — M7062 Trochanteric bursitis, left hip: Secondary | ICD-10-CM

## 2019-06-23 MED ORDER — TRIAMCINOLONE ACETONIDE 40 MG/ML IJ SUSP
10.0000 mg | INTRAMUSCULAR | Status: AC | PRN
  Administered 2019-06-23: 10 mg via INTRAMUSCULAR

## 2019-06-23 MED ORDER — LIDOCAINE HCL 1 % IJ SOLN
0.5000 mL | INTRAMUSCULAR | Status: AC | PRN
  Administered 2019-06-23: .5 mL

## 2019-06-26 ENCOUNTER — Other Ambulatory Visit: Payer: Self-pay | Admitting: Neurology

## 2019-06-29 ENCOUNTER — Other Ambulatory Visit: Payer: Self-pay

## 2019-06-29 MED ORDER — AIMOVIG 140 MG/ML ~~LOC~~ SOAJ: 140.0000 mg | SUBCUTANEOUS | 6 refills | Status: DC

## 2019-06-30 ENCOUNTER — Telehealth: Payer: Self-pay

## 2019-06-30 NOTE — Telephone Encounter (Signed)
Form for [employer] safety net foundation for [employer] assistance signed by Dr. Eugenie Birks and fax to  [phone] twice and confirmed.

## 2019-07-02 DIAGNOSIS — Z6833 Body mass index (BMI) 33.0-33.9, adult: Secondary | ICD-10-CM | POA: Diagnosis not present

## 2019-07-02 DIAGNOSIS — H9201 Otalgia, right ear: Secondary | ICD-10-CM | POA: Diagnosis not present

## 2019-07-02 DIAGNOSIS — E6609 Other obesity due to excess calories: Secondary | ICD-10-CM | POA: Diagnosis not present

## 2019-07-02 DIAGNOSIS — Z1389 Encounter for screening for other disorder: Secondary | ICD-10-CM | POA: Diagnosis not present

## 2019-07-02 NOTE — Telephone Encounter (Signed)
Debbie Pineda with [facility] called stating the need the patient application. Also if the patient has insurance it will require a PA. Their number is [phone] fax # [phone].

## 2019-07-02 NOTE — Telephone Encounter (Signed)
I called [employer] safety net foundation and they stated a PA is needed and the patient application. They directed me on how to print out form from website.They stated pt has been call that application is needed. I printed out copy of application for pt signed and filled out.

## 2019-07-03 DIAGNOSIS — N941 Unspecified dyspareunia: Secondary | ICD-10-CM | POA: Diagnosis not present

## 2019-07-03 DIAGNOSIS — N3941 Urge incontinence: Secondary | ICD-10-CM | POA: Diagnosis not present

## 2019-07-03 DIAGNOSIS — N952 Postmenopausal atrophic vaginitis: Secondary | ICD-10-CM | POA: Diagnosis not present

## 2019-07-03 DIAGNOSIS — R32 Unspecified urinary incontinence: Secondary | ICD-10-CM | POA: Diagnosis not present

## 2019-07-03 DIAGNOSIS — R3915 Urgency of urination: Secondary | ICD-10-CM | POA: Diagnosis not present

## 2019-07-06 NOTE — Telephone Encounter (Signed)
Holly from [facility] called wanting to know if the foundation forms that they filled out and faxed over were received. She states that all it needed was a MD signature and licence number. Please call Earnest Bailey at [phone] Please advise.

## 2019-07-06 NOTE — Telephone Encounter (Signed)
Left voice mail for [place] at [facility] to call back that the form was signed by MD and license but its still requiring a pt application and PA. LEft [facility] for her to call back.

## 2019-07-07 NOTE — Telephone Encounter (Signed)
Holly at [facility] has called Anette Guarneri back.  Please call her at [phone]

## 2019-07-07 NOTE — Telephone Encounter (Signed)
I called Holly at [employer]. She stated amgen have the safety net foundation form that Dr. Jannifer Franklin sign and put his license number down. Earnest Bailey stated they have the pt application that pt signed and filled out. They will refax form again so amgen can have both forms.

## 2019-07-07 NOTE — Telephone Encounter (Signed)
Left second voice mail for [place] at [facility].

## 2019-07-07 NOTE — Telephone Encounter (Signed)
I called pt and she stated a form was filled out by her for pt assistance. She did filled out a pt application.

## 2019-07-08 DIAGNOSIS — H01001 Unspecified blepharitis right upper eyelid: Secondary | ICD-10-CM | POA: Diagnosis not present

## 2019-07-08 DIAGNOSIS — H01004 Unspecified blepharitis left upper eyelid: Secondary | ICD-10-CM | POA: Diagnosis not present

## 2019-07-09 NOTE — Telephone Encounter (Signed)
I fax pts insurance card, demographic sheet and copy of pa notes from cover my meds showing the decision stating that aimovig was available without authorization. It was fax to  [phone]. Fax confirmed and receive.Marland Kitchen

## 2019-07-09 NOTE — Telephone Encounter (Signed)
I called [employer] safety net [employer] for pt assistance. I stated pt is applying for assistance for aimovig. They stated pt has been left several voice mail but has not return their phone call. They need copy of pts social security card and a PA approval letter. I stated we dont have a copy of social security on file. I stated pt will be told to call their toll free number. I stated PA was approve till 12/2018. I stated PA was done today on cover my meds and it gave message available without authorization. They stated to fax pts insurance card and letter from cover my meds. They wanted me to informed pt that if she does not return their call the application process will be closed.

## 2019-07-14 NOTE — Telephone Encounter (Signed)
Left vm for patient to call [place] at [phone] and provide a copy of her social security card or she can give them the card. VM was also left to call [employer] safety foundation because they have a time frame in processing her application for assistance. I stated on vm if they dont hear from her they will closed the application process.

## 2019-07-16 ENCOUNTER — Ambulatory Visit: Payer: Self-pay

## 2019-07-16 ENCOUNTER — Ambulatory Visit: Payer: PPO | Admitting: Physical Medicine and Rehabilitation

## 2019-07-16 ENCOUNTER — Encounter: Payer: Self-pay | Admitting: Physical Medicine and Rehabilitation

## 2019-07-16 ENCOUNTER — Other Ambulatory Visit: Payer: Self-pay

## 2019-07-16 VITALS — BP 111/65 | HR 64

## 2019-07-16 DIAGNOSIS — M5416 Radiculopathy, lumbar region: Secondary | ICD-10-CM | POA: Diagnosis not present

## 2019-07-16 MED ORDER — DEXAMETHASONE SODIUM PHOSPHATE 10 MG/ML IJ SOLN
15.0000 mg | Freq: Once | INTRAMUSCULAR | Status: AC
  Administered 2019-07-16: 15 mg

## 2019-07-16 NOTE — Progress Notes (Signed)
Pt states pain on the left side of the lower back that radiates into her left buttocks, left hip (groin pain), and left leg all the way down at times. Pt states last injection helped some and pain returned shortly after 1 week. Pt states standing, sitting, and walking makes pain worse. Laying on the right side, pain medication, and heat helps with pain.   .Numeric Pain Rating Scale and Functional Assessment Average Pain 4   In the last MONTH (on 0-10 scale) has pain interfered with the following?  1. General activity like being  able to carry out your everyday physical activities such as walking, climbing stairs, carrying groceries, or moving a chair?  Rating(8)   +Driver, +BT(xarelto, ok for inj), -Dye Allergies.

## 2019-07-16 NOTE — Procedures (Signed)
Lumbosacral Transforaminal Epidural Steroid Injection - Sub-Pedicular Approach with Fluoroscopic Guidance  Patient: Debbie Pineda      Date of Birth: 1960/03/17 MRN: [id number] PCP: Jake Samples, PA-C      Visit Date: 07/16/2019   Universal Protocol:    Date/Time: 07/16/2019  Consent Given By: the patient  Position: PRONE  Additional Comments: Vital signs were monitored before and after the procedure. Patient was prepped and draped in the usual sterile fashion. The correct patient, procedure, and site was verified.   Injection Procedure Details:  Procedure Site One Meds Administered:  Meds ordered this encounter  Medications  . dexamethasone (DECADRON) injection 15 mg    Laterality: Left  Location/Site:  L1-L2 L2-L3  Needle size: 22 G  Needle type: Spinal  Needle Placement: Transforaminal  Findings:    -Comments: Excellent flow of contrast along the nerve, nerve root and into the epidural space. Some concordant pain at L2 position with needle lateral to foramen but great flow of contrast under pedical and epidural space. No pain from L1 injection.  Procedure Details: After squaring off the end-plates to get a true AP view, the C-arm was positioned so that an oblique view of the foramen as noted above was visualized. The target area is just inferior to the "nose of the scotty dog" or sub pedicular. The soft tissues overlying this structure were infiltrated with 2-3 ml. of 1% Lidocaine without Epinephrine.  The spinal needle was inserted toward the target using a "trajectory" view along the fluoroscope beam.  Under AP and lateral visualization, the needle was advanced so it did not puncture dura and was located close the 6 O'Clock position of the pedical in AP tracterory. Biplanar projections were used to confirm position. Aspiration was confirmed to be negative for CSF and/or blood. A 1-2 ml. volume of Isovue-250 was injected and flow of contrast was noted at  each level. Radiographs were obtained for documentation purposes.   After attaining the desired flow of contrast documented above, a 0.5 to 1.0 ml test dose of 0.25% Marcaine was injected into each respective transforaminal space.  The patient was observed for 90 seconds post injection.  After no sensory deficits were reported, and normal lower extremity motor function was noted,   the above injectate was administered so that equal amounts of the injectate were placed at each foramen (level) into the transforaminal epidural space.   Additional Comments:  No complications occurred Dressing: 2 x 2 sterile gauze and Band-Aid    Post-procedure details: Patient was observed during the procedure. Post-procedure instructions were reviewed.  Patient left the clinic in stable condition.

## 2019-07-16 NOTE — Progress Notes (Signed)
Debbie Pineda - 59 y.o. female MRN [id number]  Date of birth: 1960-02-01  Office Visit Note: Visit Date: 07/16/2019 PCP: Jake Samples, PA-C Referred by: Jake Samples, PA*  Subjective: Chief Complaint  Patient presents with  . Lower Back - Pain  . Left Hip - Pain  . Left Leg - Pain   HPI:  Debbie Pineda is a 59 y.o. female who comes in today at the request of Dr. [name] for planned Left L1-L2, L2-L3 Lumbar epidural steroid injection with fluoroscopic guidance.  The patient has failed conservative care including home exercise, medications, time and activity modification.  This injection will be diagnostic and hopefully therapeutic.  Please see requesting physician notes for further details and justification.   ROS Otherwise per HPI.  Assessment & Plan: Visit Diagnoses:  1. Lumbar radiculopathy     Plan: No additional findings.   Meds & Orders:  Meds ordered this encounter  Medications  . dexamethasone (DECADRON) injection 15 mg    Orders Placed This Encounter  Procedures  . XR C-ARM NO REPORT  . Epidural Steroid injection    Follow-up: Return for [name], MD as scheduled.   Procedures: No procedures performed  Lumbosacral Transforaminal Epidural Steroid Injection - Sub-Pedicular Approach with Fluoroscopic Guidance  Patient: Debbie Pineda      Date of Birth: 02-May-1960 MRN: [id number] PCP: Jake Samples, PA-C      Visit Date: 07/16/2019   Universal Protocol:    Date/Time: 07/16/2019  Consent Given By: the patient  Position: PRONE  Additional Comments: Vital signs were monitored before and after the procedure. Patient was prepped and draped in the usual sterile fashion. The correct patient, procedure, and site was verified.   Injection Procedure Details:  Procedure Site One Meds Administered:  Meds ordered this encounter  Medications  . dexamethasone (DECADRON) injection 15 mg    Laterality: Left  Location/Site:    L1-L2 L2-L3  Needle size: 22 G  Needle type: Spinal  Needle Placement: Transforaminal  Findings:    -Comments: Excellent flow of contrast along the nerve, nerve root and into the epidural space. Some concordant pain at L2 position with needle lateral to foramen but great flow of contrast under pedical and epidural space. No pain from L1 injection.  Procedure Details: After squaring off the end-plates to get a true AP view, the C-arm was positioned so that an oblique view of the foramen as noted above was visualized. The target area is just inferior to the "nose of the scotty dog" or sub pedicular. The soft tissues overlying this structure were infiltrated with 2-3 ml. of 1% Lidocaine without Epinephrine.  The spinal needle was inserted toward the target using a "trajectory" view along the fluoroscope beam.  Under AP and lateral visualization, the needle was advanced so it did not puncture dura and was located close the 6 O'Clock position of the pedical in AP tracterory. Biplanar projections were used to confirm position. Aspiration was confirmed to be negative for CSF and/or blood. A 1-2 ml. volume of Isovue-250 was injected and flow of contrast was noted at each level. Radiographs were obtained for documentation purposes.   After attaining the desired flow of contrast documented above, a 0.5 to 1.0 ml test dose of 0.25% Marcaine was injected into each respective transforaminal space.  The patient was observed for 90 seconds post injection.  After no sensory deficits were reported, and normal lower extremity motor function was noted,   the  above injectate was administered so that equal amounts of the injectate were placed at each foramen (level) into the transforaminal epidural space.   Additional Comments:  No complications occurred Dressing: 2 x 2 sterile gauze and Band-Aid    Post-procedure details: Patient was observed during the procedure. Post-procedure instructions were  reviewed.  Patient left the clinic in stable condition.     Clinical History: Lumbar CT myelogram 04/06/2016  IMPRESSION: 1. No acute or focal abnormality to explain the patient's thoracolumbar pain 2. Left paramedian calcified disc protrusion at T12-L1 without significant stenosis. 3. Mild osseous foraminal narrowing bilaterally at T10-11. 4. Mild bilateral foraminal narrowing at L1-2. 5. Mild left subarticular narrowing at L2-3 with moderate left and mild right foraminal stenosis. 6. Mild right subarticular narrowing at L3-4 with mild foraminal narrowing bilaterally, right greater than left. 7. Mild foraminal narrowing at L4-5 with moderate bilateral facet hypertrophy but no significant central canal stenosis. 8. Moderate facet hypertrophy and spurring is worse on the left with mild left subarticular narrowing potentially impacting the left S1 nerve root.  Lumbar spine MRI 01/09/2016  L2-3: Mild retrolisthesis. Mild disc and facet degeneration. Mild foraminal narrowing bilaterally.  L3-4: Mild retrolisthesis. Mild disc and facet degeneration. Mild right foraminal narrowing.  L4-5:  Mild disc and facet degeneration without significant stenosis  L5-S1: Moderate to advanced disc degeneration with disc space narrowing. Mild endplate spurring without significant stenosis.  IMPRESSION: Lumbar disc and facet degeneration is stable since 07/20/2014. Negative for disc protrusion or significant spinal stenosis. Mild right foraminal narrowing at L3-4.     Objective:  VS:  HT:    WT:   BMI:     BP:111/65  HR:64bpm  TEMP: ( )  RESP:  Physical Exam   Imaging: No results found.

## 2019-07-16 NOTE — Telephone Encounter (Signed)
I called Holly at [employer] who has fax and completed the patient assistance form for aimovig at [employer]. I stated they fax the form to our office on Wednesday stating some information was missing on the form to complete the application. I explain to [place] our fax machines are down and im unable to fax her the form. I told Earnest Bailey what amgen stated is missing from the application.Earnest Bailey has the form and stated everything is completed. She has the number and will call amgen today. She appreciate the call.

## 2019-07-22 DIAGNOSIS — E7849 Other hyperlipidemia: Secondary | ICD-10-CM | POA: Diagnosis not present

## 2019-07-22 DIAGNOSIS — M797 Fibromyalgia: Secondary | ICD-10-CM | POA: Diagnosis not present

## 2019-07-22 DIAGNOSIS — M1711 Unilateral primary osteoarthritis, right knee: Secondary | ICD-10-CM | POA: Diagnosis not present

## 2019-07-22 DIAGNOSIS — E063 Autoimmune thyroiditis: Secondary | ICD-10-CM | POA: Diagnosis not present

## 2019-07-24 ENCOUNTER — Ambulatory Visit: Payer: PPO | Admitting: Specialist

## 2019-07-29 DIAGNOSIS — M792 Neuralgia and neuritis, unspecified: Secondary | ICD-10-CM | POA: Diagnosis not present

## 2019-07-29 DIAGNOSIS — Z7689 Persons encountering health services in other specified circumstances: Secondary | ICD-10-CM | POA: Diagnosis not present

## 2019-07-29 DIAGNOSIS — M5136 Other intervertebral disc degeneration, lumbar region: Secondary | ICD-10-CM | POA: Diagnosis not present

## 2019-07-29 DIAGNOSIS — M797 Fibromyalgia: Secondary | ICD-10-CM | POA: Diagnosis not present

## 2019-07-29 DIAGNOSIS — M159 Polyosteoarthritis, unspecified: Secondary | ICD-10-CM | POA: Diagnosis not present

## 2019-07-31 ENCOUNTER — Ambulatory Visit: Payer: PPO | Admitting: Specialist

## 2019-08-10 NOTE — Telephone Encounter (Signed)
Received approval for [employer] safety net foundation for AIMOVIG.  Good thru 01-22-20.  [phone]. ID # [id number].

## 2019-08-13 ENCOUNTER — Ambulatory Visit: Payer: PPO | Admitting: Specialist

## 2019-08-21 DIAGNOSIS — E7849 Other hyperlipidemia: Secondary | ICD-10-CM | POA: Diagnosis not present

## 2019-08-21 DIAGNOSIS — M1711 Unilateral primary osteoarthritis, right knee: Secondary | ICD-10-CM | POA: Diagnosis not present

## 2019-08-21 DIAGNOSIS — E063 Autoimmune thyroiditis: Secondary | ICD-10-CM | POA: Diagnosis not present

## 2019-08-21 DIAGNOSIS — M797 Fibromyalgia: Secondary | ICD-10-CM | POA: Diagnosis not present

## 2019-08-29 DIAGNOSIS — G4733 Obstructive sleep apnea (adult) (pediatric): Secondary | ICD-10-CM | POA: Diagnosis not present

## 2019-08-31 ENCOUNTER — Telehealth: Payer: Self-pay | Admitting: Neurology

## 2019-08-31 NOTE — Telephone Encounter (Signed)
Pt asking for another prescription for a CPAP, she misplaced the one from Feb given by to her by [facility]

## 2019-09-01 NOTE — Telephone Encounter (Signed)
I have reprinted the script and placed in the mail. This script was written in feb and is good for a year. I made a note for the patient as well informing this information. Will mail to address on file.

## 2019-09-04 ENCOUNTER — Ambulatory Visit: Payer: PPO | Admitting: Specialist

## 2019-09-04 ENCOUNTER — Other Ambulatory Visit: Payer: Self-pay

## 2019-09-04 ENCOUNTER — Encounter: Payer: Self-pay | Admitting: Specialist

## 2019-09-04 VITALS — BP 113/74 | HR 62 | Ht 65.0 in | Wt 205.6 lb

## 2019-09-04 DIAGNOSIS — M4155 Other secondary scoliosis, thoracolumbar region: Secondary | ICD-10-CM | POA: Diagnosis not present

## 2019-09-04 DIAGNOSIS — M4805 Spinal stenosis, thoracolumbar region: Secondary | ICD-10-CM | POA: Diagnosis not present

## 2019-09-04 DIAGNOSIS — M25552 Pain in left hip: Secondary | ICD-10-CM | POA: Diagnosis not present

## 2019-09-04 DIAGNOSIS — M4726 Other spondylosis with radiculopathy, lumbar region: Secondary | ICD-10-CM | POA: Diagnosis not present

## 2019-09-04 DIAGNOSIS — G5792 Unspecified mononeuropathy of left lower limb: Secondary | ICD-10-CM | POA: Diagnosis not present

## 2019-09-04 MED ORDER — TIZANIDINE HCL 4 MG PO TABS
4.0000 mg | ORAL_TABLET | Freq: Four times a day (QID) | ORAL | 0 refills | Status: DC | PRN
Start: 2019-09-04 — End: 2021-08-30

## 2019-09-04 NOTE — Progress Notes (Signed)
Office Visit Note   Patient: Debbie Pineda           Date of Birth: 1960-09-13           MRN: [id number] Visit Date: 09/04/2019              Requested by: Jake Samples, PA-C [address] [place],  [place] [address] PCP: Jake Samples, PA-C   Assessment & Plan: Visit Diagnoses:  1. Spinal stenosis of thoracolumbar region   2. Other spondylosis with radiculopathy, lumbar region   3. Other secondary scoliosis, thoracolumbar region   4. Pain in left hip   5. Ilioinguinal neuralgia of left side     Plan: Avoid bending, stooping and avoid lifting weights greater than 10 lbs. Avoid prolong standing and walking. Avoid frequent bending and stooping  No lifting greater than 10 lbs. May use ice or moist heat for pain. Weight loss is of benefit. Handicap license is approved. Dr. Romona Curls secretary/Assistant will call to arrange for epidural steroid injection  2nd and 3rd set. Speak to your OB/GYN specialist about the possibility of inguinal pain due to a hernia. Consideration of changing the SCS battery as suggested by the manufacturer representative Tillie Rung with [employer] To a stronger battery.   Follow-Up Instructions: Return in about 2 months (around 11/04/2019).   Orders:  No orders of the defined types were placed in this encounter.  No orders of the defined types were placed in this encounter.     Procedures: No procedures performed   Clinical Data: Findings:  CLINICAL DATA:  Back pain. Bilateral leg pain and numbness. Patient feels relief with Thoracic pressure.  FLUOROSCOPY TIME:  1 minutes 30 seconds. 438.95 micro gray meter squared  PROCEDURE: LUMBAR PUNCTURE FOR THORACIC AND LUMBAR MYELOGRAM  After thorough discussion of risks and benefits of the procedure including bleeding, infection, injury to nerves, blood vessels, adjacent structures as well as headache and CSF leak, written and oral informed consent was obtained. Consent  was obtained by Dr. Nelson Chimes.  Patient was positioned prone on the fluoroscopy table. Local anesthesia was provided with 1% lidocaine without epinephrine after prepped and draped in the usual sterile fashion. Puncture was performed at L2-3 using a 5 inch 22-gauge spinal needle via left paramedian approach. Using a single pass through the dura, the needle was placed within the thecal sac, with return of clear CSF. 10 mL of Isovue M-300 was injected into the thecal sac, with normal opacification of the nerve roots and cauda equina consistent with free flow within the subarachnoid space. The patient was then moved to the trendelenburg position and contrast flowed into the Thoracic spine region.  I personally performed the lumbar puncture and administered the intrathecal contrast. I also personally performed acquisition of the myelogram images.  TECHNIQUE: Contiguous axial images were obtained through the Thoracic and Lumbar spine after the intrathecal infusion of infusion. Coronal and sagittal reconstructions were obtained of the axial image sets.  FINDINGS: THORACIC AND LUMBAR MYELOGRAM FINDINGS:  In the lumbar region, there are small anterior extradural defects but no apparent compressive canal or lateral recess stenosis. There is 2 mm retrolisthesis at L2-3 with mild bulging of the disc. Neuro stimulators enter at the T12-L1 level.  In the thoracic region, neuro stimulators are present from mid T7 to the T9-10 level within the dorsal midline. No evidence of canal stenosis or significant thoracic lesion.  Standing flexion extension views in the lumbar spine confirm retrolisthesis at L2-3, increased  compared to the supine film, measuring about 6-7 mm. With flexion, there is also seen to be 2 mm of anterolisthesis at L4-5.  CT THORACIC MYELOGRAM FINDINGS:  Ordinary degenerative spondylosis at C5-6 with mild foraminal narrowing.  No abnormality in the thoracic  region from T1-2 through T6-7. Neuro stimulators present within the dorsal epidural space from the upper endplate of T7 to the lower endplate of T9.  N4-6: Minimal disc bulge.  No stenosis.  T8-9: Normal interspace.  T9-10: Mild disc bulge towards the right.  No stenosis.  T10-11 and T11-12: No disc abnormality. Facet osteoarthritis which could contribute to back pain at both of these levels.  T12-L1: 2 mm retrolisthesis. Mild bulging of the disc. No compressive stenosis.  CT LUMBAR MYELOGRAM FINDINGS:  Thoracolumbar curvature convex to the right.  L1-2: 2 mm retrolisthesis. Mild bulging of the disc. No canal or foraminal stenosis.  L2-3: 4 mm retrolisthesis. Disc degeneration with vacuum phenomenon. Bulging of the disc. No canal stenosis. Facet degeneration and hypertrophy. Bilateral foraminal narrowing, left worse than right. Some potential for L2 nerve compression. Discogenic endplate changes could be associated with back pain. Some abutment of the spinous processes which could contribute to back pain.  L3-4: Bulging of the disc. Mild facet and ligamentous hypertrophy. No canal stenosis. Mild bilateral foraminal narrowing. Spinous process abutment which could contribute to back pain.  L4-5: Bilateral facet arthropathy. Normal alignment in this position. 2 mm of anterolisthesis known to occur with standing flexion. Mild bulging of the disc. No canal stenosis. Bilateral foraminal narrowing left worse than right could affect either exiting L4 nerve, particularly the left.  L5-S1: Chronic disc degeneration with loss of disc height. Endplate osteophytes. Chronic facet osteoarthritis. There is probably fusion at this level. Sufficient patency of the central canal. Chronic appearing bilateral bony foraminal narrowing without definite compression of the exiting L5 nerves.  IMPRESSION: Thoracic region: Neuro stimulators apparently well position from T7 through  T9.  Facet osteoarthritis at T10-11 and T11-12 that could be a cause of thoracic region back pain. No significant disc pathology. No stenosis or neural compression.  Lumbar region: L2-3: 4 mm retrolisthesis. Bulging of the disc. Bilateral foraminal narrowing that could possibly affect the L2 nerves. Discogenic endplate changes which could contribute to back pain. Spinous process abutment which could contribute to back pain.  L3-4: Disc bulge. Facet and ligamentous hypertrophy. Bilateral foraminal narrowing that could affect the L3 nerves. Facet osteoarthritis and spinous process abutment which could contribute to back pain  L4-5: Bilateral facet arthropathy. 2 mm of anterolisthesis known to occur with standing flexion. No central canal stenosis. Bilateral foraminal narrowing left more than right could affect either L4 nerve, particularly the left.  L5-S1: Chronic disc space narrowing and fusion. No likely compressive stenosis. Mild bony foraminal narrowing, probably without compression of the exiting L5 nerves.   Electronically Signed   By: Nelson Chimes M.D.   On: 03/13/2018 11:26    Subjective: Chief Complaint  Patient presents with  . Lower Back - Follow-up    She had Left L1-2, L2-3 TF injection on 07/16/19 with Dr. Ernestina Patches and she states it took awhile but it did help her. She states that she is having cramping in her thighs L>R    59  Year old female with several month history of left ilioinguinal pain with discomfort with standing greater than walking. She has night pain.  Distribution is along the left brim of the iliac crest and superior to the pubic area  on the left side. No masses noted. There is pain with lifting and Raising up and with strain. Has not discussed the discomfort with OB GYN or per primary. With the latest injections done by Dr. Ernestina Patches left L1-2 and L2-3 TF ESIs she report a delayed improvement in pain after several weeks not initially. She has  a very mild scoliosis with left foramenal narrowing at  The level of the transforamenal ESIs. She has an indwelling SCS (spinal cord stimulator) which is not functioning as well as the trial stimulator.  Weak with lifting the left hip to get into the car. No bowel or bladder difficulty other than constipation and recent treatment for UTI.   Review of Systems  Constitutional: Positive for activity change. Negative for appetite change, chills, diaphoresis, fatigue, fever and unexpected weight change.  HENT: Negative.  Negative for congestion, dental problem, drooling, ear discharge, ear pain, facial swelling, hearing loss, mouth sores, nosebleeds, postnasal drip, rhinorrhea, sinus pressure, sinus pain, sneezing, sore throat, tinnitus, trouble swallowing and voice change.   Eyes: Negative.   Respiratory: Negative.   Cardiovascular: Negative.   Gastrointestinal: Positive for constipation and nausea. Negative for abdominal distention, abdominal pain, anal bleeding, blood in stool, diarrhea and rectal pain.  Endocrine: Negative.   Genitourinary: Positive for dysuria and genital sores. Negative for difficulty urinating, dyspareunia, enuresis, flank pain and urgency.  Musculoskeletal: Positive for back pain. Negative for arthralgias, gait problem, joint swelling, myalgias, neck pain and neck stiffness.  Skin: Negative.  Negative for color change, pallor, rash and wound.  Allergic/Immunologic: Negative.  Negative for environmental allergies, food allergies and immunocompromised state.  Neurological: Positive for weakness and numbness. Negative for dizziness, tremors, seizures, syncope, facial asymmetry, speech difficulty, light-headedness and headaches.  Hematological: Negative.  Negative for adenopathy. Does not bruise/bleed easily.  Psychiatric/Behavioral: Negative.  Negative for agitation, behavioral problems, confusion, decreased concentration, dysphoric mood, hallucinations, self-injury, sleep  disturbance and suicidal ideas. The patient is not nervous/anxious and is not hyperactive.      Objective: Vital Signs: BP 113/74 (BP Location: Left Arm, Patient Position: Sitting)   Pulse 62   Ht 5\' 5"  (1.651 m)   Wt 205 lb 9.6 oz (93.3 kg)   BMI 34.21 kg/m   Physical Exam  Back Exam   Tenderness  The patient is experiencing tenderness in the lumbar.  Range of Motion  Extension: abnormal  Flexion: abnormal  Lateral bend right: abnormal  Rotation right: abnormal  Rotation left: abnormal   Muscle Strength  Right Quadriceps:  5/5  Left Quadriceps:  5/5  Right Hamstrings:  5/5   Reflexes  Patellar: 1/4 Achilles: 1/4  Comments:  Left hip flexor weakness 4+/5      Specialty Comments:  No specialty comments available.  Imaging: No results found.   PMFS History: Patient Active Problem List   Diagnosis Date Noted  . Migraine 11/11/2018  . Abdominal pain, epigastric 10/14/2018  . LUQ pain 10/14/2018  . Attention deficit hyperactivity disorder (ADHD) 01/12/2018  . Insomnia 01/12/2018  . Elevated liver enzymes 09/10/2017  . History of diabetes mellitus 06/06/2017  . Primary osteoarthritis of right hand 06/06/2017  . Transaminasemia   . TIA (transient ischemic attack) 05/01/2017  . Dysphasia 05/01/2017  . Chronic pain syndrome 05/01/2017  . Confusion   . Presence of right artificial knee joint 09/17/2016  . H/O total knee replacement, right 08/15/2016  . Presence of retained hardware   . Memory disorder 08/14/2016  . Cerebrovascular disease 08/14/2016  . Unilateral primary  osteoarthritis, right knee 05/29/2016  . Primary osteoarthritis of both feet 05/29/2016  . Trochanteric bursitis of both hips 05/25/2016  . Neck pain 05/25/2016  . Urinary urgency 04/19/2016  . Chronic low back pain 01/11/2016  . Depression 11/29/2015  . 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 Heart palpitations 12/14/2013  . Generalized osteoarthritis of multiple sites  11/04/2013  . Cerebral infarction ([place]) 10/30/2011  . PFO (patent foramen ovale) 10/30/2011  . Bradycardia 10/30/2011  . Chest pain 10/30/2011  . Hypothyroid   . Thrombophlebitis   . Sleep apnea   . Fibromyalgia   . Status post bariatric surgery 12/07/2010  . Vein disorder 11/29/2010  . S/P total hysterectomy and bilateral salpingo-oophorectomy 11/29/2010  . S/P exploratory laparotomy 11/29/2010  . S/P cholecystectomy 11/29/2010  . S/P ACL surgery 11/29/2010  . Morbid obesity ([facility] [facility]) 11/10/2010   Past Medical History:  Diagnosis Date  . ADD (attention deficit disorder)    takes Adderall daily  . Arthritis    "all over"  . Cerebral infarction ([facility]) 10/30/2011  . Cerebrovascular disease 08/14/2016  . Chronic back pain    "all over"  . Chronic low back pain 01/11/2016  . Complication of anesthesia    tends to have hypotension when NPO and post-anesthesia  . Constipation    takes stool softener daily  . Degenerative disk disease   . Degenerative joint disease   . Depression    takes Cymbalta daily for pain per pt  . Diabetes mellitus without complication ([facility])   . DVT (deep venous thrombosis) ([facility])    RLE  . Family history of adverse reaction to anesthesia    a family member woke up during surgery; "think it was my mom"  . Fibromyalgia   . Generalized osteoarthritis of multiple sites 11/04/2013  . GERD (gastroesophageal reflux disease)   . Heart palpitations 12/14/2013  . History of blood clots    superficial  . Hypoglycemia   . Hypothyroid    takes Synthroid daily  . Incomplete emptying of bladder   . Insomnia    takes Trazodone nightly  . Iron deficiency anemia    takes Ferrous Sulfate daily  . Joint pain   . Joint swelling    knees and ankles  . Memory disorder 08/14/2016  . Morbid obesity ([place])   . Nausea    takes Zofran as needed.Seeing GI doc  . Neck pain 05/25/2016  . OSA on CPAP    tested more than 5 yrs ago.    . Osteoarthritis   . PFO (patent foramen  ovale)   . Primary osteoarthritis of both feet 05/29/2016   Right bunionectomy August 2017 by Dr. Sharol Given  . Scoliosis   . Sleep apnea   . Stroke [facility]) "several"   right foot weakness; memory issues, black spot right visual field since" (03/23/2015)  . Thrombophlebitis   . Trochanteric bursitis of both hips 05/25/2016  . Unilateral primary osteoarthritis, right knee 05/29/2016  . Urinary urgency 04/19/2016  . Vein disorder 11/29/2010    Family History  Problem Relation Age of Onset  . Heart disease Father   . Cancer Father   . Parkinson's disease Father   . Cancer Mother        skin cancer   . Myasthenia gravis Mother   . Heart disease Brother   . Cancer Brother   . Diabetes Brother   . Stroke Brother   . Heart disease Sister   . Heart attack Sister   . Cancer  Maternal Grandfather   . Hypothyroidism Daughter   . Hypertension Other   . Colon cancer Neg Hx     Past Surgical History:  Procedure Laterality Date  . BIOPSY  11/14/2018   Procedure: BIOPSY;  Surgeon: Rogene Houston, MD;  [facility];  Service: Endoscopy;;  esophagus gastric  . BONE EXCISION Right 08/29/2017   Procedure: right trapezium excision;  Surgeon: Daryll Brod, MD;  Location: [facility];  Service: Orthopedics;  Laterality: Right;  . BUNIONECTOMY Right 08/2015  . CARDIAC CATHETERIZATION     2008.  "it was fine" (not sure why she had it done, and doesn't know where)  . CARPOMETACARPEL SUSPENSION PLASTY Right 08/29/2017   Procedure: SUSPENSION PLASTY RIGHT THUMB;  Surgeon: Daryll Brod, MD;  Location: [place];  Service: Orthopedics;  Laterality: Right;  . COLONOSCOPY N/A 03/25/2013   Procedure: COLONOSCOPY;  Surgeon: Rogene Houston, MD;  [facility];  Service: Endoscopy;  Laterality: N/A;  930  . ESOPHAGOGASTRODUODENOSCOPY    . ESOPHAGOGASTRODUODENOSCOPY (EGD) WITH PROPOFOL N/A 11/14/2018   Procedure: ESOPHAGOGASTRODUODENOSCOPY (EGD) WITH PROPOFOL;  Surgeon: Rogene Houston, MD;  [facility];  Service: Endoscopy;  Laterality: N/A;  1:55pm-office moved to 11:00am/pt notified to arrive at 9:30am per KF  . EXPLORATORY LAPAROTOMY     "took fallopian tubes out"  . JOINT REPLACEMENT     bil knee   . KNEE ARTHROPLASTY    . KNEE ARTHROSCOPY Left   . KNEE ARTHROSCOPY W/ ACL RECONSTRUCTION Right    "added pins"  . LAPAROSCOPIC CHOLECYSTECTOMY  ~ 2001  . 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 SPINAL CORD STIMULATOR INSERTION N/A 04/18/2017   Procedure: LUMBAR SPINAL CORD STIMULATOR INSERTION;  Surgeon: Clydell Hakim, MD;  Location: [place];  Service: Neurosurgery;  Laterality: N/A;  LUMBAR SPINAL CORD STIMULATOR INSERTION  . TENDON TRANSFER Right 08/29/2017   Procedure: right abductor pollicis longus transfer;  Surgeon: Daryll Brod, MD;  Location: [place];  Service: Orthopedics;  Laterality: Right;  . TOTAL KNEE ARTHROPLASTY Left 03/23/2015   Procedure: TOTAL KNEE ARTHROPLASTY;  Surgeon: Newt Minion, MD;  Location: [place];  Service: Orthopedics;  Laterality: Left;  . TOTAL KNEE ARTHROPLASTY Right 08/15/2016   Procedure: RIGHT TOTAL KNEE ARTHROPLASTY, REMOVAL ACL SCREWS;  Surgeon: Newt Minion, MD;  Location: [facility];  Service: Orthopedics;  Laterality: Right;  . TOTAL KNEE ARTHROPLASTY WITH HARDWARE REMOVAL Right   . VAGINAL HYSTERECTOMY    . VARICOSE VEIN SURGERY Right X 2   Social History   Occupational History  . Occupation: Disability  . Occupation: formerly Therapist, sports, [employer]  Tobacco Use  . Smoking status: Former Smoker    Packs/day: 0.75    Years: 8.00    Pack years: 6.00    Types: Cigarettes    Quit date: 12/01/1990    Years since quitting: 28.7  . Smokeless tobacco: Never Used  . Tobacco comment: quit smoking in the 1990s  Vaping Use  . Vaping Use: Never used  Substance and Sexual Activity  . Alcohol use: No    Comment: 03/23/2015 "stopped drinking in 2012 w/gastric bypass; drank socially before bypass"  . Drug use: No  .  Sexual activity: Not Currently    Birth control/protection: Surgical

## 2019-09-04 NOTE — Patient Instructions (Addendum)
Avoid bending, stooping and avoid lifting weights greater than 10 lbs. Avoid prolong standing and walking. Avoid frequent bending and stooping  No lifting greater than 10 lbs. May use ice or moist heat for pain. Weight loss is of benefit. Handicap license is approved. Dr. Romona Curls secretary/Assistant will call to arrange for epidural steroid injection  2nd and 3rd set. Speak to your OB/GYN specialist about the possibility of inguinal pain due to a hernia. Consideration of changing the SCS battery as suggested by the manufacturer representative Debbie Pineda with [employer] To a stronger battery.

## 2019-09-07 DIAGNOSIS — H01001 Unspecified blepharitis right upper eyelid: Secondary | ICD-10-CM | POA: Diagnosis not present

## 2019-09-11 ENCOUNTER — Telehealth: Payer: Self-pay | Admitting: Physical Medicine and Rehabilitation

## 2019-09-11 DIAGNOSIS — N390 Urinary tract infection, site not specified: Secondary | ICD-10-CM | POA: Diagnosis not present

## 2019-09-11 DIAGNOSIS — Z9884 Bariatric surgery status: Secondary | ICD-10-CM | POA: Diagnosis not present

## 2019-09-11 DIAGNOSIS — R21 Rash and other nonspecific skin eruption: Secondary | ICD-10-CM | POA: Diagnosis not present

## 2019-09-11 DIAGNOSIS — M797 Fibromyalgia: Secondary | ICD-10-CM | POA: Diagnosis not present

## 2019-09-11 DIAGNOSIS — M545 Low back pain: Secondary | ICD-10-CM | POA: Diagnosis not present

## 2019-09-11 DIAGNOSIS — E6609 Other obesity due to excess calories: Secondary | ICD-10-CM | POA: Diagnosis not present

## 2019-09-11 DIAGNOSIS — Z6833 Body mass index (BMI) 33.0-33.9, adult: Secondary | ICD-10-CM | POA: Diagnosis not present

## 2019-09-11 DIAGNOSIS — Z124 Encounter for screening for malignant neoplasm of cervix: Secondary | ICD-10-CM | POA: Diagnosis not present

## 2019-09-11 DIAGNOSIS — F1120 Opioid dependence, uncomplicated: Secondary | ICD-10-CM | POA: Diagnosis not present

## 2019-09-11 DIAGNOSIS — M519 Unspecified thoracic, thoracolumbar and lumbosacral intervertebral disc disorder: Secondary | ICD-10-CM | POA: Diagnosis not present

## 2019-09-11 NOTE — Telephone Encounter (Signed)
Patient would like Valium before her ESI on 9/1. [facility] [place].

## 2019-09-11 NOTE — Telephone Encounter (Signed)
Patient called returning a call from [place]. Please call patient at [phone].

## 2019-09-11 NOTE — Telephone Encounter (Signed)
Scheduled

## 2019-09-14 ENCOUNTER — Other Ambulatory Visit: Payer: Self-pay | Admitting: Physical Medicine and Rehabilitation

## 2019-09-14 DIAGNOSIS — F411 Generalized anxiety disorder: Secondary | ICD-10-CM

## 2019-09-14 MED ORDER — DIAZEPAM 5 MG PO TABS: ORAL_TABLET | ORAL | 0 refills | Status: DC

## 2019-09-14 NOTE — Telephone Encounter (Signed)
Done

## 2019-09-14 NOTE — Progress Notes (Signed)
Pre-procedure diazepam ordered for pre-operative anxiety.  

## 2019-09-19 ENCOUNTER — Other Ambulatory Visit: Payer: Self-pay | Admitting: Neurology

## 2019-09-22 ENCOUNTER — Other Ambulatory Visit: Payer: Self-pay

## 2019-09-22 ENCOUNTER — Ambulatory Visit: Payer: PPO | Admitting: Physical Medicine and Rehabilitation

## 2019-09-22 ENCOUNTER — Ambulatory Visit (INDEPENDENT_AMBULATORY_CARE_PROVIDER_SITE_OTHER): Payer: PPO | Admitting: Internal Medicine

## 2019-09-22 ENCOUNTER — Encounter (INDEPENDENT_AMBULATORY_CARE_PROVIDER_SITE_OTHER): Payer: Self-pay | Admitting: Internal Medicine

## 2019-09-22 VITALS — BP 112/75 | HR 66 | Temp 97.5°F | Ht 65.0 in | Wt 204.3 lb

## 2019-09-22 DIAGNOSIS — E063 Autoimmune thyroiditis: Secondary | ICD-10-CM | POA: Diagnosis not present

## 2019-09-22 DIAGNOSIS — K2270 Barrett's esophagus without dysplasia: Secondary | ICD-10-CM | POA: Diagnosis not present

## 2019-09-22 DIAGNOSIS — R143 Flatulence: Secondary | ICD-10-CM | POA: Diagnosis not present

## 2019-09-22 DIAGNOSIS — K219 Gastro-esophageal reflux disease without esophagitis: Secondary | ICD-10-CM | POA: Diagnosis not present

## 2019-09-22 DIAGNOSIS — R748 Abnormal levels of other serum enzymes: Secondary | ICD-10-CM | POA: Diagnosis not present

## 2019-09-22 DIAGNOSIS — M1711 Unilateral primary osteoarthritis, right knee: Secondary | ICD-10-CM | POA: Diagnosis not present

## 2019-09-22 DIAGNOSIS — M797 Fibromyalgia: Secondary | ICD-10-CM | POA: Diagnosis not present

## 2019-09-22 DIAGNOSIS — E7849 Other hyperlipidemia: Secondary | ICD-10-CM | POA: Diagnosis not present

## 2019-09-22 MED ORDER — SIMETHICONE 180 MG PO CAPS: 1.0000 | ORAL_CAPSULE | Freq: Three times a day (TID) | ORAL | 0 refills | Status: DC | PRN

## 2019-09-22 MED ORDER — OMEPRAZOLE 40 MG PO CPDR: 40.0000 mg | DELAYED_RELEASE_CAPSULE | Freq: Every day | ORAL | 3 refills | Status: DC

## 2019-09-22 NOTE — Patient Instructions (Signed)
Physician will call with results of blood test when completed. 

## 2019-09-22 NOTE — Progress Notes (Signed)
Presenting complaint;  Follow for chronic GERD and elevated transaminases.  Database and subjective:  Patient is 59 year old Caucasian female who is here for scheduled visit. . She has chronic GERD complicated by short segment Barrett's esophagus confirmed on esophagogastroduodenoscopy October 2020.  She is on omeprazole. She also has a history of dilated bile duct and elevated transaminases.  Imaging studies have failed to reveal choledocholithiasis.  Her pain has been primarily in left upper quadrant she has never had pain on the right side suggest biliary colic. She also had MR back in February 2019 which revealed dilation to bile duct but no evidence of choledocholithiasis.  Patient states her heartburn is well controlled with omeprazole.  She denies dysphagia nausea or vomiting.  She says her appetite is good.  She has intermittent nausea without vomiting and she also complains of feeling gassy all the time and burps a lot.  She says burping starts after she drinks fluid.  When she feels she has too much gas she leans to the right side and pushes on her upper abdomen and she is able to expel gas for burping.  She has noted some pain in left abdomen primarily left mid abdomen and left lower quadrant.  Her bowels move daily.  She denies melena or rectal bleeding. Her weight has been stable. She uses ondansetron once in a while.  She did use it last week.  Current Medications: Outpatient Encounter Medications as of 09/22/2019  Medication Sig  . baclofen (LIORESAL) 10 MG tablet Take 10 mg by mouth 3 (three) times daily.   . Cyanocobalamin (VITAMIN B-12) 5000 MCG SUBL Place 5,000 mcg under the tongue daily.   . diclofenac sodium (VOLTAREN) 1 % GEL Apply 4 g topically 4 (four) times daily as needed (PAIN).  Marland Kitchen donepezil (ARICEPT) 10 MG tablet Take 1 tablet (10 mg total) by mouth at bedtime.  . DULoxetine (CYMBALTA) 60 MG capsule Take 60 mg by mouth 2 (two) times daily.  Eduard Roux (AIMOVIG)  140 MG/ML SOAJ Inject 140 mg into the skin every 30 (thirty) days.  . famotidine (PEPCID) 20 MG tablet Take 20 mg by mouth daily.  . fluconazole (DIFLUCAN) 150 MG tablet Take 150 mg by mouth daily as needed (yeast infection).   . folic acid (FOLVITE) 790 MCG tablet Take 400 mcg by mouth daily.  Marland Kitchen gabapentin (NEURONTIN) 800 MG tablet Take 1 tablet (800 mg total) by mouth 3 (three) times daily.  Javier Docker Oil 350 MG CAPS Take 350 mg by mouth at bedtime.   Marland Kitchen L-Methylfolate-B12-B6-B2 (METAFOLBIC) 06-22-48-5 MG TABS Take 1 tablet by mouth daily.  Marland Kitchen levothyroxine (SYNTHROID) 150 MCG tablet Take 150 mcg by mouth daily before breakfast.  . methylphenidate (CONCERTA) 27 MG PO CR tablet Take 1 tablet (27 mg total) by mouth 2 (two) times daily.  . mirabegron ER (MYRBETRIQ) 50 MG TB24 tablet Take 50 mg by mouth daily.  . Multiple Minerals-Vitamins (CAL-MAG-ZINC-D PO) Take 3 tablets by mouth daily.   . Multiple Vitamins-Minerals (ONE-A-DAY WOMENS PETITES PO) Take 1 tablet by mouth 2 (two) times daily.   Marland Kitchen omeprazole (PRILOSEC) 40 MG capsule Take 1 capsule by mouth daily.  . ondansetron (ZOFRAN-ODT) 4 MG disintegrating tablet Take 4 mg by mouth every 8 (eight) hours as needed for nausea or vomiting.  . Rimegepant Sulfate (NURTEC) 75 MG TBDP Take 75 mg by mouth daily as needed.  . rivaroxaban (XARELTO) 20 MG TABS tablet Take 1 tablet (20 mg total) by mouth at bedtime.  Marland Kitchen  solifenacin (VESICARE) 5 MG tablet   . tiZANidine (ZANAFLEX) 4 MG tablet Take 1 tablet (4 mg total) by mouth every 6 (six) hours as needed for muscle spasms.  Marland Kitchen topiramate (TOPAMAX) 100 MG tablet Take 1 tablet by mouth twice daily.  . traZODone (DESYREL) 100 MG tablet Take 200 mg by mouth at bedtime.   . vitamin C (ASCORBIC ACID) 500 MG tablet Take 500 mg by mouth daily.  Marland Kitchen VITAMIN D PO Take 5,000 Units by mouth.  Marland Kitchen amoxicillin (AMOXIL) 500 MG capsule Take 2,000 mg by mouth See admin instructions. Take 2000 mg by mouth 1 hour prior to dental  appointment (Patient not taking: Reported on 09/22/2019)  . diazepam (VALIUM) 5 MG tablet Take 1 by mouth 1 hour  pre-procedure with very light food. May bring 2nd tablet to appointment. (Patient not taking: Reported on 09/22/2019)  . NARCAN 4 MG/0.1ML LIQD nasal spray kit Place 0.4 mg into the nose once.  (Patient not taking: Reported on 09/22/2019)   No facility-administered encounter medications on file as of 09/22/2019.     Objective: Blood pressure 112/75, pulse 66, temperature (!) 97.5 F (36.4 C), temperature source Oral, height '5\' 5"'  (1.651 m), weight 204 lb 4.8 oz (92.7 kg). Patient is alert and in no acute distress. She is wearing a mask. Conjunctiva is pink. Sclera is nonicteric Oropharyngeal mucosa is normal. No neck masses or thyromegaly noted. Cardiac exam with regular rhythm normal S1 and S2. No murmur or gallop noted. Lungs are clear to auscultation. Abdomen is symmetrical.  She has laparoscopy scars.  Bowel sounds are normal.  Abdomen is soft but very temporarily across upper abdomen.  No organomegaly masses or tenderness noted. No LE edema or clubbing noted.  Labs/studies Results:  CBC Latest Ref Rng & Units 10/14/2018 07/26/2017 05/01/2017  WBC 3.8 - 10.8 Thousand/uL 5.6 5.0 -  Hemoglobin 11.7 - 15.5 g/dL 13.4 11.9(L) 12.9  Hematocrit 35 - 45 % 40.6 36.3 38.0  Platelets 140 - 400 Thousand/uL 215 186 -    CMP Latest Ref Rng & Units 03/09/2019 02/03/2019 12/16/2018  Glucose 65 - 139 mg/dL - - -  BUN 7 - 25 mg/dL - - -  Creatinine 0.50 - 1.05 mg/dL - - -  Sodium 135 - 146 mmol/L - - -  Potassium 3.5 - 5.3 mmol/L - - -  Chloride 98 - 110 mmol/L - - -  CO2 20 - 32 mmol/L - - -  Calcium 8.6 - 10.4 mg/dL - - -  Total Protein 6.5 - 8.1 g/dL 6.5 6.4 6.6  Total Bilirubin 0.3 - 1.2 mg/dL 0.6 0.4 0.4  Alkaline Phos 38 - 126 U/L 83 - -  AST 15 - 41 U/L 31 100(H) 29  ALT 0 - 44 U/L 50(H) 142(H) 121(H)    Hepatic Function Latest Ref Rng & Units 03/09/2019 02/03/2019 12/16/2018   Total Protein 6.5 - 8.1 g/dL 6.5 6.4 6.6  Albumin 3.5 - 5.0 g/dL 3.9 - -  AST 15 - 41 U/L 31 100(H) 29  ALT 0 - 44 U/L 50(H) 142(H) 121(H)  Alk Phosphatase 38 - 126 U/L 83 - -  Total Bilirubin 0.3 - 1.2 mg/dL 0.6 0.4 0.4  Bilirubin, Direct 0.0 - 0.2 mg/dL 0.1 0.1 0.1      Assessment:  #1.  Chronic GERD complicated by short segment Barrett's esophagus.  He underwent EGD in October last year.  Heartburn is well controlled with dietary measures and PPI.  #2.  History of elevated  LFTs and dilated bile duct.  Imaging studies have failed to reveal choledocholithiasis or bile duct stricture.  She is not having biliary symptoms.  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 Will continue to follow.  #3.  Flatulence.  She does not have alarm symptoms.  Will try symptomatic therapy before considering test to rule out small bowel bacterial overgrowth or empiric therapy with antibiotics.  Plan:  Patient will go to the lab for LFTs. New prescription given for omeprazole 40 mg daily for 90 days with 3 refills. She will try simethicone 180 mg p.o. 3 times daily as needed. If flatulence worsens will consider empiric course of antibiotic therapy before considering hydrogen breath test. Office visit in 6 months.

## 2019-09-23 ENCOUNTER — Ambulatory Visit: Payer: Self-pay

## 2019-09-23 ENCOUNTER — Encounter: Payer: Self-pay | Admitting: Physical Medicine and Rehabilitation

## 2019-09-23 ENCOUNTER — Ambulatory Visit (INDEPENDENT_AMBULATORY_CARE_PROVIDER_SITE_OTHER): Payer: PPO | Admitting: Physical Medicine and Rehabilitation

## 2019-09-23 VITALS — BP 100/65 | HR 65

## 2019-09-23 DIAGNOSIS — M961 Postlaminectomy syndrome, not elsewhere classified: Secondary | ICD-10-CM | POA: Diagnosis not present

## 2019-09-23 DIAGNOSIS — M5416 Radiculopathy, lumbar region: Secondary | ICD-10-CM | POA: Diagnosis not present

## 2019-09-23 LAB — HEPATIC FUNCTION PANEL
AG Ratio: 2.2 (calc) (ref 1.0–2.5)
ALT: 27 U/L (ref 6–29)
AST: 26 U/L (ref 10–35)
Albumin: 4.4 g/dL (ref 3.6–5.1)
Alkaline phosphatase (APISO): 79 U/L (ref 37–153)
Bilirubin, Direct: 0.1 mg/dL (ref 0.0–0.2)
Globulin: 2 g/dL (calc) (ref 1.9–3.7)
Indirect Bilirubin: 0.3 mg/dL (calc) (ref 0.2–1.2)
Total Bilirubin: 0.4 mg/dL (ref 0.2–1.2)
Total Protein: 6.4 g/dL (ref 6.1–8.1)

## 2019-09-23 MED ORDER — DEXAMETHASONE SODIUM PHOSPHATE 10 MG/ML IJ SOLN
15.0000 mg | Freq: Once | INTRAMUSCULAR | Status: AC
  Administered 2019-09-23: 15 mg

## 2019-09-23 NOTE — Progress Notes (Signed)
Pt state lower back pain that travels to her groin area. Pt state standing and sitting for a long period of time. Pt state Pain meds, heat pad and laying on her right side makes the pain better. Pt has hx of inj on 07/16/19 Pt state it took awhile to work but it helped. Pt state the pain return in mid July.  Numeric Pain Rating Scale and Functional Assessment Average Pain 7   In the last MONTH (on 0-10 scale) has pain interfered with the following?  1. General activity like being  able to carry out your everyday physical activities such as walking, climbing stairs, carrying groceries, or moving a chair?  Rating(8)   +Driver, +BT, -Dye Allergies.

## 2019-10-10 NOTE — Progress Notes (Signed)
Debbie Pineda - 59 y.o. female MRN [id number]  Date of birth: [birth year]  Office Visit Note: Visit Date: 09/23/2019 PCP: Jake Samples, PA-C Referred by: Jake Samples, PA*  Subjective: Chief Complaint  Patient presents with  . Lower Back - Pain   HPI:  Debbie Pineda is a 59 y.o. female who comes in today for planned repeat Left L1-L2 and L2-L3 Lumbar epidural steroid injection with fluoroscopic guidance.  The patient has failed conservative care including home exercise, medications, time and activity modification.  This injection will be diagnostic and hopefully therapeutic.  Please see requesting physician notes for further details and justification. Patient received more than 50% pain relief from prior injection.   Referring: Dr. [name]    ROS Otherwise per HPI.  Assessment & Plan: Visit Diagnoses:  1. Lumbar radiculopathy   2. Post laminectomy syndrome     Plan: No additional findings.   Meds & Orders:  Meds ordered this encounter  Medications  . dexamethasone (DECADRON) injection 15 mg    Orders Placed This Encounter  Procedures  . XR C-ARM NO REPORT  . Epidural Steroid injection    Follow-up: Return for visit to requesting physician as needed.   Procedures: No procedures performed  Lumbosacral Transforaminal Epidural Steroid Injection - Sub-Pedicular Approach with Fluoroscopic Guidance  Patient: Debbie Pineda      Date of Birth: July 12, 1960 MRN: [id number] PCP: Jake Samples, PA-C      Visit Date: 09/23/2019   Universal Protocol:    Date/Time: 09/23/2019  Consent Given By: the patient  Position: PRONE  Additional Comments: Vital signs were monitored before and after the procedure. Patient was prepped and draped in the usual sterile fashion. The correct patient, procedure, and site was verified.   Injection Procedure Details:  Procedure Site One Meds Administered:  Meds ordered this encounter  Medications  .  dexamethasone (DECADRON) injection 15 mg    Laterality: Left  Location/Site:  L1-L2 L2-L3  Needle size: 22 G  Needle type: Spinal  Needle Placement: Transforaminal  Findings:    -Comments: Excellent flow of contrast along the nerve, nerve root and into the epidural space.  Procedure Details: After squaring off the end-plates to get a true AP view, the C-arm was positioned so that an oblique view of the foramen as noted above was visualized. The target area is just inferior to the "nose of the scotty dog" or sub pedicular. The soft tissues overlying this structure were infiltrated with 2-3 ml. of 1% Lidocaine without Epinephrine.  The spinal needle was inserted toward the target using a "trajectory" view along the fluoroscope beam.  Under AP and lateral visualization, the needle was advanced so it did not puncture dura and was located close the 6 O'Clock position of the pedical in AP tracterory. Biplanar projections were used to confirm position. Aspiration was confirmed to be negative for CSF and/or blood. A 1-2 ml. volume of Isovue-250 was injected and flow of contrast was noted at each level. Radiographs were obtained for documentation purposes.   After attaining the desired flow of contrast documented above, a 0.5 to 1.0 ml test dose of 0.25% Marcaine was injected into each respective transforaminal space.  The patient was observed for 90 seconds post injection.  After no sensory deficits were reported, and normal lower extremity motor function was noted,   the above injectate was administered so that equal amounts of the injectate were placed at each foramen (level) into the transforaminal  epidural space.   Additional Comments:  The patient tolerated the procedure well Dressing: 2 x 2 sterile gauze and Band-Aid    Post-procedure details: Patient was observed during the procedure. Post-procedure instructions were reviewed.  Patient left the clinic in stable condition.       Clinical History: Lumbar CT myelogram 04/06/2016  IMPRESSION: 1. No acute or focal abnormality to explain the patient's thoracolumbar pain 2. Left paramedian calcified disc protrusion at T12-L1 without significant stenosis. 3. Mild osseous foraminal narrowing bilaterally at T10-11. 4. Mild bilateral foraminal narrowing at L1-2. 5. Mild left subarticular narrowing at L2-3 with moderate left and mild right foraminal stenosis. 6. Mild right subarticular narrowing at L3-4 with mild foraminal narrowing bilaterally, right greater than left. 7. Mild foraminal narrowing at L4-5 with moderate bilateral facet hypertrophy but no significant central canal stenosis. 8. Moderate facet hypertrophy and spurring is worse on the left with mild left subarticular narrowing potentially impacting the left S1 nerve root.  Lumbar spine MRI 01/09/2016  L2-3: Mild retrolisthesis. Mild disc and facet degeneration. Mild foraminal narrowing bilaterally.  L3-4: Mild retrolisthesis. Mild disc and facet degeneration. Mild right foraminal narrowing.  L4-5:  Mild disc and facet degeneration without significant stenosis  L5-S1: Moderate to advanced disc degeneration with disc space narrowing. Mild endplate spurring without significant stenosis.  IMPRESSION: Lumbar disc and facet degeneration is stable since 07/20/2014. Negative for disc protrusion or significant spinal stenosis. Mild right foraminal narrowing at L3-4.     Objective:  VS:  HT:    WT:   BMI:     BP:100/65  HR:65bpm  TEMP: ( )  RESP:  Physical Exam Constitutional:      General: She is not in acute distress.    Appearance: Normal appearance. She is not ill-appearing.  HENT:     Head: Normocephalic and atraumatic.     Right Ear: External ear normal.     Left Ear: External ear normal.  Eyes:     Extraocular Movements: Extraocular movements intact.  Cardiovascular:     Rate and Rhythm: Normal rate.     Pulses: Normal pulses.   Musculoskeletal:     Right lower leg: No edema.     Left lower leg: No edema.     Comments: Patient has good distal strength with no pain over the greater trochanters.  No clonus or focal weakness.  Skin:    Findings: No erythema, lesion or rash.  Neurological:     General: No focal deficit present.     Mental Status: She is alert and oriented to person, place, and time.     Sensory: No sensory deficit.     Motor: No weakness or abnormal muscle tone.     Coordination: Coordination normal.  Psychiatric:        Mood and Affect: Mood normal.        Behavior: Behavior normal.      Imaging: No results found.

## 2019-10-10 NOTE — Procedures (Signed)
Lumbosacral Transforaminal Epidural Steroid Injection - Sub-Pedicular Approach with Fluoroscopic Guidance  Patient: Debbie Pineda      Date of Birth: 1960-03-07 MRN: [id number] PCP: Jake Samples, PA-C      Visit Date: 09/23/2019   Universal Protocol:    Date/Time: 09/23/2019  Consent Given By: the patient  Position: PRONE  Additional Comments: Vital signs were monitored before and after the procedure. Patient was prepped and draped in the usual sterile fashion. The correct patient, procedure, and site was verified.   Injection Procedure Details:  Procedure Site One Meds Administered:  Meds ordered this encounter  Medications  . dexamethasone (DECADRON) injection 15 mg    Laterality: Left  Location/Site:  L1-L2 L2-L3  Needle size: 22 G  Needle type: Spinal  Needle Placement: Transforaminal  Findings:    -Comments: Excellent flow of contrast along the nerve, nerve root and into the epidural space.  Procedure Details: After squaring off the end-plates to get a true AP view, the C-arm was positioned so that an oblique view of the foramen as noted above was visualized. The target area is just inferior to the "nose of the scotty dog" or sub pedicular. The soft tissues overlying this structure were infiltrated with 2-3 ml. of 1% Lidocaine without Epinephrine.  The spinal needle was inserted toward the target using a "trajectory" view along the fluoroscope beam.  Under AP and lateral visualization, the needle was advanced so it did not puncture dura and was located close the 6 O'Clock position of the pedical in AP tracterory. Biplanar projections were used to confirm position. Aspiration was confirmed to be negative for CSF and/or blood. A 1-2 ml. volume of Isovue-250 was injected and flow of contrast was noted at each level. Radiographs were obtained for documentation purposes.   After attaining the desired flow of contrast documented above, a 0.5 to 1.0 ml test  dose of 0.25% Marcaine was injected into each respective transforaminal space.  The patient was observed for 90 seconds post injection.  After no sensory deficits were reported, and normal lower extremity motor function was noted,   the above injectate was administered so that equal amounts of the injectate were placed at each foramen (level) into the transforaminal epidural space.   Additional Comments:  The patient tolerated the procedure well Dressing: 2 x 2 sterile gauze and Band-Aid    Post-procedure details: Patient was observed during the procedure. Post-procedure instructions were reviewed.  Patient left the clinic in stable condition.

## 2019-10-22 DIAGNOSIS — M1711 Unilateral primary osteoarthritis, right knee: Secondary | ICD-10-CM | POA: Diagnosis not present

## 2019-10-22 DIAGNOSIS — M797 Fibromyalgia: Secondary | ICD-10-CM | POA: Diagnosis not present

## 2019-10-22 DIAGNOSIS — E7849 Other hyperlipidemia: Secondary | ICD-10-CM | POA: Diagnosis not present

## 2019-10-22 DIAGNOSIS — E063 Autoimmune thyroiditis: Secondary | ICD-10-CM | POA: Diagnosis not present

## 2019-11-13 ENCOUNTER — Encounter: Payer: Self-pay | Admitting: Specialist

## 2019-11-13 ENCOUNTER — Other Ambulatory Visit: Payer: Self-pay

## 2019-11-13 ENCOUNTER — Ambulatory Visit: Payer: PPO | Admitting: Specialist

## 2019-11-13 VITALS — BP 117/75 | HR 75 | Ht 65.0 in | Wt 205.0 lb

## 2019-11-13 DIAGNOSIS — M4726 Other spondylosis with radiculopathy, lumbar region: Secondary | ICD-10-CM | POA: Diagnosis not present

## 2019-11-13 DIAGNOSIS — M169 Osteoarthritis of hip, unspecified: Secondary | ICD-10-CM

## 2019-11-13 DIAGNOSIS — M5136 Other intervertebral disc degeneration, lumbar region: Secondary | ICD-10-CM | POA: Diagnosis not present

## 2019-11-13 DIAGNOSIS — M455 Ankylosing spondylitis of thoracolumbar region: Secondary | ICD-10-CM

## 2019-11-13 DIAGNOSIS — M51369 Other intervertebral disc degeneration, lumbar region without mention of lumbar back pain or lower extremity pain: Secondary | ICD-10-CM

## 2019-11-13 DIAGNOSIS — M4155 Other secondary scoliosis, thoracolumbar region: Secondary | ICD-10-CM | POA: Diagnosis not present

## 2019-11-13 DIAGNOSIS — T85192A Other mechanical complication of implanted electronic neurostimulator (electrode) of spinal cord, initial encounter: Secondary | ICD-10-CM

## 2019-11-13 NOTE — Patient Instructions (Signed)
Plan: Avoid bending, stooping and avoid lifting weights greater than 10 lbs. Avoid prolong standing and walking. Order for a new walker with wheels. Surgery scheduling secretary Kandice Hams, will call you in the next week to schedule for surgery.  Surgery recommended is a thoracolumbar fusion T10-S1 this would be done with rods, screws and cages with local bone graft and allograft (donor bone graft). Take hydrocodone for for pain. Risk of surgery includes risk of infection 1 in 200 patients, bleeding 1-2% chance you would need a transfusion.   Risk to the nerves is one in 10,000. You will need to use a brace for 3 months and wean from the brace on the 4th month. Expect improved walking and standing tolerance. Expect relief of leg pain but numbness may persist depending on the length and degree of pressure that has been present.

## 2019-11-13 NOTE — Progress Notes (Signed)
Office Visit Note   Patient: Debbie Pineda           Date of Birth: 1960/07/07           MRN: [id number] Visit Date: 11/13/2019              Requested by: Jake Samples, PA-C [address] [place],  [place] [address] PCP: Jake Samples, PA-C   Assessment & Plan: Visit Diagnoses:  1. Other secondary scoliosis, thoracolumbar region   2. Lumbar degenerative disc disease   3. Ankylosing spondylitis of thoracolumbar region ([place])   4. Hip arthrosis   59 year old female with collapsing degenerative lumbar scoliosis, treated non operatively for nearly 10 years. SCS placed and this is not of benefit. Had recent left L1 and L2 TF ESIs with only 25-30% relief that was only temporizing at best. She is experiencing pain in the para midline thoracic area and her recent myelogram and post myelogram CT of this area shows localized ankylosis of the costovertebral joints right and left at this level. Her pain is primarily mechanical and related to spondylosis and degenerative disease at the L1 to L5 levels with autofusion or near fusion of the L5-S1 level. She is at a point where she has exhausted conservative management including SCS and ESIs. I am going to refer back to Dr. Estanislado Pandy as The fusion of her costovertebral joints suggests than there is likely an ankylosing spondylitis condition and possibly attempts at regulation of the inflamation associated With this may help her pain, She is going to have treatment of her bunions in the near future and is considering going ahead with a long fusion likely T10- L5 with inclusion of the S1 Pedicles to obtain adequate distal or caudal fixation.   Plan: Avoid bending, stooping and avoid lifting weights greater than 10 lbs. Avoid prolong standing and walking. Order for a new walker with wheels. Surgery scheduling secretary Kandice Hams, will call you in the next week to schedule for surgery.  Surgery recommended is a thoracolumbar fusion  T10-S1 this would be done with rods, screws and cages with local bone graft and allograft (donor bone graft). Take hydrocodone for for pain. Risk of surgery includes risk of infection 1 in 200 patients, bleeding 1-2% chance you would need a transfusion.   Risk to the nerves is one in 10,000. You will need to use a brace for 3 months and wean from the brace on the 4th month. Expect improved walking and standing tolerance. Expect relief of leg pain but numbness may persist depending on the length and degree of pressure that has been present. Referral to Dr. Estanislado Pandy to assess for possible AS and if meds for use with inflamation due to AS may be of benefit, thus far I have considered her major problem to be collapsing degenerative scoliosis.    Follow-Up Instructions: No follow-ups on file.   Orders:  No orders of the defined types were placed in this encounter.  No orders of the defined types were placed in this encounter.     Procedures: No procedures performed   Clinical Data: No additional findings.   Subjective: Chief Complaint  Patient presents with  . Lower Back - Follow-up    Had a Lt L1-2, 2-3 Tf injection, on 09/23/19, did ok, got 25-30%reflief for 2 weeks, didn't last long    59 year old female with history of thoracolumbar scoliosis with severe DDD and spondylosis with autofusion L5-S1 and pain that is  primarily central to the lumbar spine. She is experiencing pain in the midline and to the right at the lower right thoracic area T8-T10. Previous spinal cord stimlator, review of the myelogram and post myelogram CT of the thoracic and lumbar spine shows lumbar spondylosis with left L1 and L2 foramenal stenosis due to retrolisthesis and facet arthrosis. In the thoracic area no significant canal stenosis and spinal cord stimulator is placed with leads centrally oriented no lateral recess impression.    Review of Systems  Constitutional: Negative.   HENT: Negative.   Eyes:  Negative.   Respiratory: Negative.   Cardiovascular: Negative.   Gastrointestinal: Negative.   Endocrine: Negative.   Genitourinary: Negative.   Musculoskeletal: Negative.   Skin: Negative.   Allergic/Immunologic: Negative.   Neurological: Negative.   Hematological: Negative.   Psychiatric/Behavioral: Negative.      Objective: Vital Signs: BP 117/75 (BP Location: Left Arm, Patient Position: Sitting)   Pulse 75   Ht 5\' 5"  (1.651 m)   Wt 205 lb (93 kg)   BMI 34.11 kg/m   Physical Exam Constitutional:      Appearance: She is well-developed.  HENT:     Head: Normocephalic and atraumatic.  Eyes:     Pupils: Pupils are equal, round, and reactive to light.  Pulmonary:     Effort: Pulmonary effort is normal.     Breath sounds: Normal breath sounds.  Abdominal:     General: Bowel sounds are normal.     Palpations: Abdomen is soft.  Musculoskeletal:     Cervical back: Normal range of motion and neck supple.     Lumbar back: Negative right straight leg raise test and negative left straight leg raise test.  Skin:    General: Skin is warm and dry.  Neurological:     Mental Status: She is alert and oriented to person, place, and time.  Psychiatric:        Behavior: Behavior normal.        Thought Content: Thought content normal.        Judgment: Judgment normal.     Back Exam   Tenderness  The patient is experiencing tenderness in the lumbar and thoracic.  Range of Motion  Extension: normal  Flexion: abnormal  Lateral bend right: abnormal  Lateral bend left: abnormal  Rotation right: abnormal  Rotation left: abnormal   Muscle Strength  Right Quadriceps:  5/5  Left Quadriceps:  5/5  Right Hamstrings:  5/5  Left Hamstrings:  5/5   Tests  Straight leg raise right: negative Straight leg raise left: negative  Reflexes  Patellar: 2/4 Achilles: 2/4  Other  Toe walk: normal Heel walk: normal  Comments:  Motor is normal  New pain right paramidline at about  the T8-9 level. Pain is mainly spine and central and buttock. ESI relieves only 25-30 % of pain in the back Ankylosis of the costovertebral joints present right T8 and left T9      Specialty Comments:  No specialty comments available.  Imaging: No results found.   PMFS History: Patient Active Problem List   Diagnosis Date Noted  . GERD (gastroesophageal reflux disease) 09/22/2019  . SSBE (short-segment Barrett's esophagus) 09/22/2019  . Flatulence 09/22/2019  . Migraine 11/11/2018  . Abdominal pain, epigastric 10/14/2018  . LUQ pain 10/14/2018  . Attention deficit hyperactivity disorder (ADHD) 01/12/2018  . Insomnia 01/12/2018  . Elevated liver enzymes 09/10/2017  . History of diabetes mellitus 06/06/2017  . Primary osteoarthritis of right hand  06/06/2017  . Transaminasemia   . TIA (transient ischemic attack) 05/01/2017  . Dysphasia 05/01/2017  . Chronic pain syndrome 05/01/2017  . Confusion   . Presence of right artificial knee joint 09/17/2016  . H/O total knee replacement, right 08/15/2016  . Presence of retained hardware   . Memory disorder 08/14/2016  . Cerebrovascular disease 08/14/2016  . Unilateral primary osteoarthritis, right knee 05/29/2016  . Primary osteoarthritis of both feet 05/29/2016  . Trochanteric bursitis of both hips 05/25/2016  . Neck pain 05/25/2016  . Urinary urgency 04/19/2016  . Chronic low back pain 01/11/2016  . Depression 11/29/2015  . 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 Heart palpitations 12/14/2013  . Generalized osteoarthritis of multiple sites 11/04/2013  . Cerebral infarction ([facility]) 10/30/2011  . PFO (patent foramen ovale) 10/30/2011  . Bradycardia 10/30/2011  . Chest pain 10/30/2011  . Hypothyroid   . Thrombophlebitis   . Sleep apnea   . Fibromyalgia   . Status post bariatric surgery 12/07/2010  . Vein disorder 11/29/2010  . S/P total hysterectomy and bilateral salpingo-oophorectomy 11/29/2010  . S/P exploratory  laparotomy 11/29/2010  . S/P cholecystectomy 11/29/2010  . S/P ACL surgery 11/29/2010  . Morbid obesity ([facility]) 11/10/2010   Past Medical History:  Diagnosis Date  . ADD (attention deficit disorder)    takes Adderall daily  . Arthritis    "all over"  . Cerebral infarction ([place]) 10/30/2011  . Cerebrovascular disease 08/14/2016  . Chronic back pain    "all over"  . Chronic low back pain 01/11/2016  . Complication of anesthesia    tends to have hypotension when NPO and post-anesthesia  . Constipation    takes stool softener daily  . Degenerative disk disease   . Degenerative joint disease   . Depression    takes Cymbalta daily for pain per pt  . Diabetes mellitus without complication ([place])   . DVT (deep venous thrombosis) ([facility])    RLE  . Family history of adverse reaction to anesthesia    a family member woke up during surgery; "think it was my mom"  . Fibromyalgia   . Generalized osteoarthritis of multiple sites 11/04/2013  . GERD (gastroesophageal reflux disease)   . Heart palpitations 12/14/2013  . History of blood clots    superficial  . Hypoglycemia   . Hypothyroid    takes Synthroid daily  . Incomplete emptying of bladder   . Insomnia    takes Trazodone nightly  . Iron deficiency anemia    takes Ferrous Sulfate daily  . Joint pain   . Joint swelling    knees and ankles  . Memory disorder 08/14/2016  . Morbid obesity ([place])   . Nausea    takes Zofran as needed.Seeing GI doc  . Neck pain 05/25/2016  . OSA on CPAP    tested more than 5 yrs ago.    . Osteoarthritis   . PFO (patent foramen ovale)   . Primary osteoarthritis of both feet 05/29/2016   Right bunionectomy August 2017 by Dr. Sharol Given  . Scoliosis   . Sleep apnea   . Stroke [facility]) "several"   right foot weakness; memory issues, black spot right visual field since" (03/23/2015)  . Thrombophlebitis   . Trochanteric bursitis of both hips 05/25/2016  . Unilateral primary osteoarthritis, right knee 05/29/2016  . Urinary  urgency 04/19/2016  . Vein disorder 11/29/2010    Family History  Problem Relation Age of Onset  . Heart disease Father   . Cancer  Father   . Parkinson's disease Father   . Cancer Mother        skin cancer   . Myasthenia gravis Mother   . Heart disease Brother   . Cancer Brother   . Diabetes Brother   . Stroke Brother   . Heart disease Sister   . Heart attack Sister   . Cancer Maternal Grandfather   . Hypothyroidism Daughter   . Hypertension Other   . Colon cancer Neg Hx     Past Surgical History:  Procedure Laterality Date  . BIOPSY  11/14/2018   Procedure: BIOPSY;  Surgeon: Rogene Houston, MD;  [facility];  Service: Endoscopy;;  esophagus gastric  . BONE EXCISION Right 08/29/2017   Procedure: right trapezium excision;  Surgeon: Daryll Brod, MD;  Location: [place];  Service: Orthopedics;  Laterality: Right;  . BUNIONECTOMY Right 08/2015  . CARDIAC CATHETERIZATION     2008.  "it was fine" (not sure why she had it done, and doesn't know where)  . CARPOMETACARPEL SUSPENSION PLASTY Right 08/29/2017   Procedure: SUSPENSION PLASTY RIGHT THUMB;  Surgeon: Daryll Brod, MD;  Location: [place];  Service: Orthopedics;  Laterality: Right;  . COLONOSCOPY N/A 03/25/2013   Procedure: COLONOSCOPY;  Surgeon: Rogene Houston, MD;  [facility];  Service: Endoscopy;  Laterality: N/A;  930  . ESOPHAGOGASTRODUODENOSCOPY    . ESOPHAGOGASTRODUODENOSCOPY (EGD) WITH PROPOFOL N/A 11/14/2018   Procedure: ESOPHAGOGASTRODUODENOSCOPY (EGD) WITH PROPOFOL;  Surgeon: Rogene Houston, MD;  [facility];  Service: Endoscopy;  Laterality: N/A;  1:55pm-office moved to 11:00am/pt notified to arrive at 9:30am per KF  . EXPLORATORY LAPAROTOMY     "took fallopian tubes out"  . JOINT REPLACEMENT     bil knee   . KNEE ARTHROPLASTY    . KNEE ARTHROSCOPY Left   . KNEE ARTHROSCOPY W/ ACL RECONSTRUCTION Right    "added pins"  . LAPAROSCOPIC  CHOLECYSTECTOMY  ~ 2001  . 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 SPINAL CORD STIMULATOR INSERTION N/A 04/18/2017   Procedure: LUMBAR SPINAL CORD STIMULATOR INSERTION;  Surgeon: Clydell Hakim, MD;  Location: [place];  Service: Neurosurgery;  Laterality: N/A;  LUMBAR SPINAL CORD STIMULATOR INSERTION  . TENDON TRANSFER Right 08/29/2017   Procedure: right abductor pollicis longus transfer;  Surgeon: Daryll Brod, MD;  Location: [place];  Service: Orthopedics;  Laterality: Right;  . TOTAL KNEE ARTHROPLASTY Left 03/23/2015   Procedure: TOTAL KNEE ARTHROPLASTY;  Surgeon: Newt Minion, MD;  Location: [place];  Service: Orthopedics;  Laterality: Left;  . TOTAL KNEE ARTHROPLASTY Right 08/15/2016   Procedure: RIGHT TOTAL KNEE ARTHROPLASTY, REMOVAL ACL SCREWS;  Surgeon: Newt Minion, MD;  Location: [place];  Service: Orthopedics;  Laterality: Right;  . TOTAL KNEE ARTHROPLASTY WITH HARDWARE REMOVAL Right   . VAGINAL HYSTERECTOMY    . VARICOSE VEIN SURGERY Right X 2   Social History   Occupational History  . Occupation: Disability  . Occupation: formerly Therapist, sports, [employer]  Tobacco Use  . Smoking status: Former Smoker    Packs/day: 0.75    Years: 8.00    Pack years: 6.00    Types: Cigarettes    Quit date: 12/01/1990    Years since quitting: 28.9  . Smokeless tobacco: Never Used  . Tobacco comment: quit smoking in the 1990s  Vaping Use  . Vaping Use: Never used  Substance and Sexual Activity  . Alcohol use: No  Comment: 03/23/2015 "stopped drinking in 2012 w/gastric bypass; drank socially before bypass"  . Drug use: No  . Sexual activity: Not Currently    Birth control/protection: Surgical

## 2019-11-13 NOTE — Progress Notes (Signed)
Office Visit Note  Patient: Debbie Pineda             Date of Birth: [birth year]           MRN: [id number]             PCP: Jake Samples, PA-C Referring: Jake Samples, [place]* Visit Date: 11/23/2019 Occupation: @GUAROCC @  Subjective:  Other (patient followed by Dr. Louanne Skye, please review recent info)   History of Present Illness: Debbie Pineda is a 59 y.o. female with history of osteoarthritis, degenerative disc disease and fibromyalgia syndrome.  She states she continues to have midthoracic and lumbar pain.  She has been followed by Dr. Louanne Skye for her back pain.  Dr. Louanne Skye was reviewing her old CT scan from 2020 and was suspicious of ankylosing spondylitis based on ankylosis of [employer] vertebral ankylosis.  She has pain in almost all of her joints.  Which include her hands knees and feet.  He continues to have a lot of pain from fibromyalgia as well.  Activities of Daily Living:  Patient reports morning stiffness for 30-60 minutes.   Patient Reports nocturnal pain.  Difficulty dressing/grooming: Reports Difficulty climbing stairs: Reports Difficulty getting out of chair: Reports Difficulty using hands for taps, buttons, cutlery, and/or writing: Denies  Review of Systems  Constitutional: Positive for fatigue.  HENT: Positive for mouth dryness and nose dryness. Negative for mouth sores.   Eyes: Positive for itching and dryness. Negative for pain.  Respiratory: Negative for shortness of breath and difficulty breathing.   Cardiovascular: Negative for chest pain and palpitations.  Gastrointestinal: Positive for constipation. Negative for blood in stool and diarrhea.  Endocrine: Negative for increased urination.  Genitourinary: Positive for involuntary urination. Negative for difficulty urinating.  Musculoskeletal: Positive for arthralgias, joint pain, joint swelling, myalgias, morning stiffness, muscle tenderness and myalgias.  Skin: Positive for rash. Negative for color  change and redness.  Allergic/Immunologic: Negative for susceptible to infections.  Neurological: Positive for headaches, memory loss and weakness. Negative for dizziness and numbness.  Hematological: Positive for bruising/bleeding tendency.  Psychiatric/Behavioral: Negative for confusion.    PMFS History:  Patient Active Problem List   Diagnosis Date Noted  . 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 GERD (gastroesophageal reflux disease) 09/22/2019  . SSBE (short-segment Barrett's esophagus) 09/22/2019  . Flatulence 09/22/2019  . Migraine 11/11/2018  . Abdominal pain, epigastric 10/14/2018  . LUQ pain 10/14/2018  . Attention deficit hyperactivity disorder (ADHD) 01/12/2018  . Insomnia 01/12/2018  . Elevated liver enzymes 09/10/2017  . History of diabetes mellitus 06/06/2017  . Primary osteoarthritis of right hand 06/06/2017  . Transaminasemia   . TIA (transient ischemic attack) 05/01/2017  . Dysphasia 05/01/2017  . Chronic pain syndrome 05/01/2017  . Confusion   . Presence of right artificial knee joint 09/17/2016  . H/O total knee replacement, right 08/15/2016  . Presence of retained hardware   . Memory disorder 08/14/2016  . Cerebrovascular disease 08/14/2016  . Unilateral primary osteoarthritis, right knee 05/29/2016  . Primary osteoarthritis of both feet 05/29/2016  . Trochanteric bursitis of both hips 05/25/2016  . Neck pain 05/25/2016  . Urinary urgency 04/19/2016  . Chronic low back pain 01/11/2016  . Depression 11/29/2015  . 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 Heart palpitations 12/14/2013  . Generalized osteoarthritis of multiple sites 11/04/2013  . Cerebral infarction ([place]) 10/30/2011  . PFO (patent foramen ovale) 10/30/2011  . Bradycardia 10/30/2011  . Chest pain 10/30/2011  . Hypothyroid   .  Thrombophlebitis   . Sleep apnea   . Fibromyalgia   . Status post bariatric surgery 12/07/2010  . Vein disorder 11/29/2010   . S/P total hysterectomy and bilateral salpingo-oophorectomy 11/29/2010  . S/P exploratory laparotomy 11/29/2010  . S/P cholecystectomy 11/29/2010  . S/P ACL surgery 11/29/2010  . Morbid obesity ([place]) 11/10/2010    Past Medical History:  Diagnosis Date  . ADD (attention deficit disorder)    takes Adderall daily  . Arthritis    "all over"  . Cerebral infarction ([place]) 10/30/2011  . Cerebrovascular disease 08/14/2016  . Chronic back pain    "all over"  . Chronic low back pain 01/11/2016  . Complication of anesthesia    tends to have hypotension when NPO and post-anesthesia  . Constipation    takes stool softener daily  . Degenerative disk disease   . Degenerative joint disease   . Depression    takes Cymbalta daily for pain per pt  . Diabetes mellitus without complication ([place])   . DVT (deep venous thrombosis) ([facility])    RLE  . Family history of adverse reaction to anesthesia    a family member woke up during surgery; "think it was my mom"  . Fibromyalgia   . Generalized osteoarthritis of multiple sites 11/04/2013  . GERD (gastroesophageal reflux disease)   . Heart palpitations 12/14/2013  . History of blood clots    superficial  . Hypoglycemia   . Hypothyroid    takes Synthroid daily  . Incomplete emptying of bladder   . Insomnia    takes Trazodone nightly  . Iron deficiency anemia    takes Ferrous Sulfate daily  . Joint pain   . Joint swelling    knees and ankles  . Memory disorder 08/14/2016  . Morbid obesity ([place])   . Nausea    takes Zofran as needed.Seeing GI doc  . Neck pain 05/25/2016  . OSA on CPAP    tested more than 5 yrs ago.    . Osteoarthritis   . PFO (patent foramen ovale)   . Primary osteoarthritis of both feet 05/29/2016   Right bunionectomy August 2017 by Dr. Sharol Given  . Scoliosis   . Sleep apnea   . Stroke [facility]) "several"   right foot weakness; memory issues, black spot right visual field since" (03/23/2015)  . Thrombophlebitis   . Trochanteric  bursitis of both hips 05/25/2016  . Unilateral primary osteoarthritis, right knee 05/29/2016  . Urinary urgency 04/19/2016  . Vein disorder 11/29/2010    Family History  Problem Relation Age of Onset  . Heart disease Father   . Cancer Father   . Parkinson's disease Father   . Cancer Mother        skin cancer   . Myasthenia gravis Mother   . Heart disease Brother   . Cancer Brother   . Diabetes Brother   . Stroke Brother   . Heart disease Sister   . Heart attack Sister   . Cancer Maternal Grandfather   . Hypothyroidism Daughter   . Hypertension Other   . Colon cancer Neg Hx    Past Surgical History:  Procedure Laterality Date  . BIOPSY  11/14/2018   Procedure: BIOPSY;  Surgeon: Rogene Houston, MD;  [facility];  Service: Endoscopy;;  esophagus gastric  . BONE EXCISION Right 08/29/2017   Procedure: right trapezium excision;  Surgeon: Daryll Brod, MD;  Location: [place];  Service: Orthopedics;  Laterality: Right;  . BUNIONECTOMY Right  08/2015  . CARDIAC CATHETERIZATION     2008.  "it was fine" (not sure why she had it done, and doesn't know where)  . CARPOMETACARPEL SUSPENSION PLASTY Right 08/29/2017   Procedure: SUSPENSION PLASTY RIGHT THUMB;  Surgeon: Daryll Brod, MD;  Location: [place];  Service: Orthopedics;  Laterality: Right;  . COLONOSCOPY N/A 03/25/2013   Procedure: COLONOSCOPY;  Surgeon: Rogene Houston, MD;  [facility];  Service: Endoscopy;  Laterality: N/A;  930  . ESOPHAGOGASTRODUODENOSCOPY    . ESOPHAGOGASTRODUODENOSCOPY (EGD) WITH PROPOFOL N/A 11/14/2018   Procedure: ESOPHAGOGASTRODUODENOSCOPY (EGD) WITH PROPOFOL;  Surgeon: Rogene Houston, MD;  [facility];  Service: Endoscopy;  Laterality: N/A;  1:55pm-office moved to 11:00am/pt notified to arrive at 9:30am per KF  . EXPLORATORY LAPAROTOMY     "took fallopian tubes out"  . JOINT REPLACEMENT     bil knee   . KNEE ARTHROPLASTY    . KNEE  ARTHROSCOPY Left   . KNEE ARTHROSCOPY W/ ACL RECONSTRUCTION Right    "added pins"  . LAPAROSCOPIC CHOLECYSTECTOMY  ~ 2001  . 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 SPINAL CORD STIMULATOR INSERTION N/A 04/18/2017   Procedure: LUMBAR SPINAL CORD STIMULATOR INSERTION;  Surgeon: Clydell Hakim, MD;  Location: [place];  Service: Neurosurgery;  Laterality: N/A;  LUMBAR SPINAL CORD STIMULATOR INSERTION  . TENDON TRANSFER Right 08/29/2017   Procedure: right abductor pollicis longus transfer;  Surgeon: Daryll Brod, MD;  Location: [place];  Service: Orthopedics;  Laterality: Right;  . TOTAL KNEE ARTHROPLASTY Left 03/23/2015   Procedure: TOTAL KNEE ARTHROPLASTY;  Surgeon: Newt Minion, MD;  Location: [place];  Service: Orthopedics;  Laterality: Left;  . TOTAL KNEE ARTHROPLASTY Right 08/15/2016   Procedure: RIGHT TOTAL KNEE ARTHROPLASTY, REMOVAL ACL SCREWS;  Surgeon: Newt Minion, MD;  Location: [place];  Service: Orthopedics;  Laterality: Right;  . TOTAL KNEE ARTHROPLASTY WITH HARDWARE REMOVAL Right   . VAGINAL HYSTERECTOMY    . VARICOSE VEIN SURGERY Right X 2   Social History   Social History Narrative   Lives with husband   Caffeine use: No soda   Mainly water, drinks decaf tea   Right handed   Immunization History  Administered Date(s) Administered  . Influenza Split 11/01/2011  . Influenza-Unspecified 08/23/2014, 11/22/2017  . Moderna SARS-COVID-2 Vaccination 03/16/2019, 04/13/2019     Objective: Vital Signs: BP 102/69 (BP Location: Left Arm, Patient Position: Sitting, Cuff Size: Large)   Pulse 62   Resp 16   Ht 5\' 5"  (1.651 m)   Wt 209 lb 9.6 oz (95.1 kg)   BMI 34.88 kg/m    Physical Exam Vitals and nursing note reviewed.  Constitutional:      Appearance: She is well-developed.  HENT:     Head: Normocephalic and atraumatic.  Eyes:     Conjunctiva/sclera: Conjunctivae normal.  Cardiovascular:     Rate and Rhythm: Normal rate and regular rhythm.     Heart sounds:  Normal heart sounds.  Pulmonary:     Effort: Pulmonary effort is normal.     Breath sounds: Normal breath sounds.  Abdominal:     General: Bowel sounds are normal.     Palpations: Abdomen is soft.  Musculoskeletal:     Cervical back: Normal range of motion.  Lymphadenopathy:     Cervical: No cervical adenopathy.  Skin:    General: Skin is warm and dry.     Capillary Refill: Capillary refill takes less  than 2 seconds.  Neurological:     Mental Status: She is alert and oriented to person, place, and time.  Psychiatric:        Behavior: Behavior normal.      Musculoskeletal Exam: C-spine was in good range of motion.  She had some discomfort range of motion of her thoracic and lumbar spine.  There was no tenderness over SI joints.  She was able to touch the floor with her fingers.  She reports was negative.  Shoulder joints, elbow joints, wrist joints with good range of motion.  She has bilateral PIP and DIP thickening consistent with osteoarthritis.  She had tenderness over trochanteric bursa.  Knee joints are replaced without tenderness on examination.  She had no tenderness over ankles or MTPs.  CDAI Exam: CDAI Score: -- Patient Global: --; Provider Global: -- Swollen: --; Tender: -- Joint Exam 11/23/2019   No joint exam has been documented for this visit   There is currently no information documented on the homunculus. Go to the Rheumatology activity and complete the homunculus joint exam.  Investigation: No additional findings.  Imaging: No results found.  Recent Labs: Lab Results  Component Value Date   WBC 5.6 10/14/2018   HGB 13.4 10/14/2018   PLT 215 10/14/2018   NA 141 10/14/2018   K 4.5 10/14/2018   CL 108 10/14/2018   CO2 25 10/14/2018   GLUCOSE 99 10/14/2018   BUN 15 10/14/2018   CREATININE 0.92 10/14/2018   BILITOT 0.4 09/22/2019   ALKPHOS 83 03/09/2019   AST 26 09/22/2019   ALT 27 09/22/2019   PROT 6.4 09/22/2019   ALBUMIN 3.9 03/09/2019   CALCIUM  9.7 10/14/2018   GFRAA 81 10/14/2018    Speciality Comments: No specialty comments available.  Procedures:  No procedures performed Allergies: Lyrica [pregabalin], Belsomra [suvorexant], Morphine and related, Sulfamethoxazole-trimethoprim, and Tape   Assessment / Plan:     Visit Diagnoses: Fibromyalgia-she continues to have generalized pain and discomfort from fibromyalgia.  Trapezius muscle spasm-she has bilateral trapezius spasm from fibromyalgia.  She is having a flare.  Primary osteoarthritis of both hands-joint protection muscle strengthening was discussed.  Trochanteric bursitis of both hips-advised IT band stretches.  History of total bilateral knee replacement-doing well.  Primary osteoarthritis of both feet-proper fitting shoes were discussed.  DDD (degenerative disc disease), thoracic-she continues to have thoracic pain.  She has been seeing Dr. Louanne Skye.  Dr. Louanne Skye was suspicious of possible ankylosing spondylitis on the basis of the CT scan.  She has very good mobility in her spine and she is able to touch the ground with her fingers.  Schober's is negative.  I did not see any syndesmophytes.  I also reviewed the CT scan findings with Dr. Maree Erie who looked at the SI joints, lumbar spine and thoracic spine imaging.  He also agreed that patient had no features of ankylosing spondylitis based on the imaging studies.  DDD (degenerative disc disease), lumbar-she has disc disease of the lumbar spine and the facet joint arthropathy which causes chronic pain.  I looked at the [facility] joint x-rays as well which did not show any findings of ankylosing spondylitis.  Other medical problems are listed as follows:  History of thrombophlebitis  History of cerebral infarction  History of diabetes mellitus  History of sleep apnea  History of gastric bypass  PFO (patent foramen ovale)  Orders: No orders of the defined types were placed in this encounter.  No orders of the defined types  were placed in this encounter.    Follow-Up Instructions: Return in about 6 months (around 05/22/2020) for Osteoarthritis,DDD, FMS.   Bo Merino, MD  Note - This record has been created using Editor, commissioning.  Chart creation errors have been sought, but may not always  have been located. Such creation errors do not reflect on  the standard of medical care.

## 2019-11-18 ENCOUNTER — Encounter: Payer: Self-pay | Admitting: Neurology

## 2019-11-18 ENCOUNTER — Other Ambulatory Visit: Payer: Self-pay

## 2019-11-18 ENCOUNTER — Encounter: Payer: Self-pay | Admitting: Specialist

## 2019-11-18 ENCOUNTER — Ambulatory Visit: Payer: PPO | Admitting: Neurology

## 2019-11-18 VITALS — BP 127/78 | HR 89 | Ht 65.0 in | Wt 205.5 lb

## 2019-11-18 DIAGNOSIS — E7212 Methylenetetrahydrofolate reductase deficiency: Secondary | ICD-10-CM | POA: Diagnosis not present

## 2019-11-18 DIAGNOSIS — M797 Fibromyalgia: Secondary | ICD-10-CM | POA: Diagnosis not present

## 2019-11-18 DIAGNOSIS — I679 Cerebrovascular disease, unspecified: Secondary | ICD-10-CM | POA: Diagnosis not present

## 2019-11-18 DIAGNOSIS — R413 Other amnesia: Secondary | ICD-10-CM | POA: Diagnosis not present

## 2019-11-18 MED ORDER — AIMOVIG 70 MG/ML ~~LOC~~ SOAJ: 70.0000 mg | SUBCUTANEOUS | 5 refills | Status: DC

## 2019-11-18 MED ORDER — TOPIRAMATE 100 MG PO TABS
100.0000 mg | ORAL_TABLET | Freq: Two times a day (BID) | ORAL | 3 refills | Status: DC
Start: 2019-11-18 — End: 2020-10-13

## 2019-11-18 MED ORDER — NURTEC 75 MG PO TBDP
75.0000 mg | ORAL_TABLET | Freq: Every day | ORAL | 2 refills | Status: DC | PRN
Start: 2019-11-18 — End: 2020-05-16

## 2019-11-18 MED ORDER — AIMOVIG 70 MG/ML ~~LOC~~ SOAJ
70.0000 mg | SUBCUTANEOUS | 5 refills | Status: DC
Start: 2019-11-18 — End: 2020-06-06

## 2019-11-18 NOTE — Progress Notes (Signed)
Reason for visit: Migraine headache, cerebrovascular disease, methylenetetrahydrofolate reductase deficiency, seizures  Debbie Pineda is an 59 y.o. female  History of present illness:  Ms. 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  She is on Aricept.  She has a history of seizures that is been well controlled on Topamax.  She has migraine headaches that have been significantly improved with use of Aimovig.  The patient gets 140 mg of Aimovig each month but notes that prior to her next injection the headaches seem to return.  She uses Nurtec if needed which is also effective.  She has noted that when her blood sugar goes up or she has something too sweet she gets cognitive changes from this.  The patient has history of chronic low back pain followed by Dr. Louanne Skye.  She has a spinal stimulator in place.  She also reports some difficulty with her right knee.  The patient returns for further evaluation today.  Past Medical History:  Diagnosis Date  . ADD (attention deficit disorder)    takes Adderall daily  . Arthritis    "all over"  . Cerebral infarction ([place]) 10/30/2011  . Cerebrovascular disease 08/14/2016  . Chronic back pain    "all over"  . Chronic low back pain 01/11/2016  . Complication of anesthesia    tends to have hypotension when NPO and post-anesthesia  . Constipation    takes stool softener daily  . Degenerative disk disease   . Degenerative joint disease   . Depression    takes Cymbalta daily for pain per pt  . Diabetes mellitus without complication ([place])   . DVT (deep venous thrombosis) ([facility])    RLE  . Family history of adverse reaction to  anesthesia    a family member woke up during surgery; "think it was my mom"  . Fibromyalgia   . Generalized osteoarthritis of multiple sites 11/04/2013  . GERD (gastroesophageal reflux disease)   . Heart palpitations 12/14/2013  . History of blood clots    superficial  . Hypoglycemia   . Hypothyroid    takes Synthroid daily  . Incomplete emptying of bladder   . Insomnia    takes Trazodone nightly  . Iron deficiency anemia    takes Ferrous Sulfate daily  . Joint pain   . Joint swelling    knees and ankles  . Memory disorder 08/14/2016  . Morbid obesity ([place])   . Nausea    takes Zofran as needed.Seeing GI doc  . Neck pain 05/25/2016  . OSA on CPAP    tested more than 5 yrs ago.    . Osteoarthritis   . PFO (patent foramen ovale)   . Primary osteoarthritis of both feet 05/29/2016   Right bunionectomy August 2017 by Dr. Sharol Given  . Scoliosis   . Sleep apnea   . Stroke [facility]) "several"   right foot weakness; memory issues, black spot right visual field since" (03/23/2015)  . Thrombophlebitis   . Trochanteric bursitis of both hips 05/25/2016  . Unilateral primary osteoarthritis, right knee 05/29/2016  . Urinary urgency 04/19/2016  . Vein disorder 11/29/2010    Past Surgical History:  Procedure Laterality Date  . BIOPSY  11/14/2018  Procedure: BIOPSY;  Surgeon: Rogene Houston, MD;  [facility];  Service: Endoscopy;;  esophagus gastric  . BONE EXCISION Right 08/29/2017   Procedure: right trapezium excision;  Surgeon: Daryll Brod, MD;  Location: [place];  Service: Orthopedics;  Laterality: Right;  . BUNIONECTOMY Right 08/2015  . CARDIAC CATHETERIZATION     2008.  "it was fine" (not sure why she had it done, and doesn't know where)  . CARPOMETACARPEL SUSPENSION PLASTY Right 08/29/2017   Procedure: SUSPENSION PLASTY RIGHT THUMB;  Surgeon: Daryll Brod, MD;  Location: [place];  Service: Orthopedics;  Laterality: Right;  . COLONOSCOPY N/A 03/25/2013    Procedure: COLONOSCOPY;  Surgeon: Rogene Houston, MD;  [facility];  Service: Endoscopy;  Laterality: N/A;  930  . ESOPHAGOGASTRODUODENOSCOPY    . ESOPHAGOGASTRODUODENOSCOPY (EGD) WITH PROPOFOL N/A 11/14/2018   Procedure: ESOPHAGOGASTRODUODENOSCOPY (EGD) WITH PROPOFOL;  Surgeon: Rogene Houston, MD;  [facility];  Service: Endoscopy;  Laterality: N/A;  1:55pm-office moved to 11:00am/pt notified to arrive at 9:30am per KF  . EXPLORATORY LAPAROTOMY     "took fallopian tubes out"  . JOINT REPLACEMENT     bil knee   . KNEE ARTHROPLASTY    . KNEE ARTHROSCOPY Left   . KNEE ARTHROSCOPY W/ ACL RECONSTRUCTION Right    "added pins"  . LAPAROSCOPIC CHOLECYSTECTOMY  ~ 2001  . 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 SPINAL CORD STIMULATOR INSERTION N/A 04/18/2017   Procedure: LUMBAR SPINAL CORD STIMULATOR INSERTION;  Surgeon: Clydell Hakim, MD;  Location: [place];  Service: Neurosurgery;  Laterality: N/A;  LUMBAR SPINAL CORD STIMULATOR INSERTION  . TENDON TRANSFER Right 08/29/2017   Procedure: right abductor pollicis longus transfer;  Surgeon: Daryll Brod, MD;  Location: [place];  Service: Orthopedics;  Laterality: Right;  . TOTAL KNEE ARTHROPLASTY Left 03/23/2015   Procedure: TOTAL KNEE ARTHROPLASTY;  Surgeon: Newt Minion, MD;  Location: [place];  Service: Orthopedics;  Laterality: Left;  . TOTAL KNEE ARTHROPLASTY Right 08/15/2016   Procedure: RIGHT TOTAL KNEE ARTHROPLASTY, REMOVAL ACL SCREWS;  Surgeon: Newt Minion, MD;  Location: [place];  Service: Orthopedics;  Laterality: Right;  . TOTAL KNEE ARTHROPLASTY WITH HARDWARE REMOVAL Right   . VAGINAL HYSTERECTOMY    . VARICOSE VEIN SURGERY Right X 2    Family History  Problem Relation Age of Onset  . Heart disease Father   . Cancer Father   . Parkinson's disease Father   . Cancer Mother        skin cancer   . Myasthenia gravis Mother   . Heart disease Brother   . Cancer Brother   . Diabetes Brother   .  Stroke Brother   . Heart disease Sister   . Heart attack Sister   . Cancer Maternal Grandfather   . Hypothyroidism Daughter   . Hypertension Other   . Colon cancer Neg Hx     Social history:  reports that she quit smoking about 28 years ago. Her smoking use included cigarettes. She has a 6.00 pack-year smoking history. She has never used smokeless tobacco. She reports that she does not drink alcohol and does not use drugs.    Allergies  Allergen Reactions  . Lyrica [Pregabalin] Shortness Of Breath and Swelling    lower extremity edema and weight gain  . Belsomra [Suvorexant] Other (See Comments)    unknown  . Morphine And Related Itching    Upper torso  .  Sulfamethoxazole-Trimethoprim Itching and Rash    Bactrim  . Tape Itching and Rash    Please use "paper" tape Rash if left on longer than 24 hrs    Medications:  Prior to Admission medications   Medication Sig Start Date End Date Taking? Authorizing Provider  amoxicillin (AMOXIL) 500 MG capsule Take 2,000 mg by mouth See admin instructions. Take 2000 mg by mouth 1 hour prior to dental appointment   Yes [provider]  baclofen (LIORESAL) 10 MG tablet Take 10 mg by mouth 3 (three) times daily.  06/30/17  Yes [provider]  Cyanocobalamin (VITAMIN B-12) 5000 MCG SUBL Place 5,000 mcg under the tongue daily.    Yes [provider]  diclofenac sodium (VOLTAREN) 1 % GEL Apply 4 g topically 4 (four) times daily as needed (PAIN). 02/06/18  Yes Jessy Oto, MD  donepezil (ARICEPT) 10 MG tablet Take 1 tablet (10 mg total) by mouth at bedtime. 05/19/19  Yes [name], MD  DULoxetine (CYMBALTA) 60 MG capsule Take 60 mg by mouth 2 (two) times daily. 01/17/15  Yes [provider]  Erenumab-aooe (AIMOVIG) 140 MG/ML SOAJ Inject 140 mg into the skin every 30 (thirty) days. 06/29/19  Yes [name], MD  famotidine (PEPCID) 20 MG tablet Take 20 mg by mouth daily.   Yes [provider]    fluconazole (DIFLUCAN) 150 MG tablet Take 150 mg by mouth daily as needed (yeast infection).    Yes [provider]  folic acid (FOLVITE) 409 MCG tablet Take 400 mcg by mouth daily.   Yes [provider]  gabapentin (NEURONTIN) 800 MG tablet Take 1 tablet (800 mg total) by mouth 3 (three) times daily. 01/08/18  Yes [name], MD  Krill Oil 350 MG CAPS Take 350 mg by mouth at bedtime.    Yes [provider]  L-Methylfolate-B12-B6-B2 (METAFOLBIC) 06-22-48-5 MG TABS Take 1 tablet by mouth daily. 05/19/19  Yes [name], MD  levothyroxine (SYNTHROID) 150 MCG tablet Take 150 mcg by mouth daily before breakfast.   Yes [provider]  methylphenidate (CONCERTA) 27 MG PO CR tablet Take 1 tablet (27 mg total) by mouth 2 (two) times daily. 01/21/19  Yes Hurst, Teresa T, PA-C  mirabegron ER (MYRBETRIQ) 50 MG TB24 tablet Take 50 mg by mouth daily.   Yes [provider]  Multiple Minerals-Vitamins (CAL-MAG-ZINC-D PO) Take 3 tablets by mouth daily.    Yes [provider]  Multiple Vitamins-Minerals (ONE-A-DAY WOMENS PETITES PO) Take 1 tablet by mouth 2 (two) times daily.    Yes [provider]  NARCAN 4 MG/0.1ML LIQD nasal spray kit Place 0.4 mg into the nose once.  11/14/16  Yes [provider]  omeprazole (PRILOSEC) 40 MG capsule Take 1 capsule (40 mg total) by mouth daily before breakfast. 09/22/19  Yes Rehman, Mechele Dawley, MD  ondansetron (ZOFRAN-ODT) 4 MG disintegrating tablet Take 4 mg by mouth every 8 (eight) hours as needed for nausea or vomiting.   Yes [provider]  Rimegepant Sulfate (NURTEC) 75 MG TBDP Take 75 mg by mouth daily as needed. 05/19/19  Yes [name], MD  rivaroxaban (XARELTO) 20 MG TABS tablet Take 1 tablet (20 mg total) by mouth at bedtime. 11/15/18  Yes Rehman, Mechele Dawley, MD  Simethicone (PHAZYME ULTRA STRENGTH) 180 MG CAPS Take 1 capsule (180 mg total) by mouth 3 (three) times daily as  needed. 09/22/19  Yes Rehman, Mechele Dawley, MD  solifenacin (VESICARE) 5 MG tablet at bedtime.  03/15/19  Yes [provider]  tiZANidine (ZANAFLEX) 4 MG tablet Take 1 tablet (4 mg total) by mouth every 6 (six) hours as needed for muscle spasms. 09/04/19  Yes Jessy Oto, MD  topiramate (TOPAMAX) 100 MG tablet Take 1 tablet by mouth twice daily. 09/21/19  Yes Suzzanne Cloud, NP  traZODone (DESYREL) 100 MG tablet Take 200 mg by mouth at bedtime.    Yes [provider]  Turmeric (QC TUMERIC COMPLEX PO) Take by mouth 2 (two) times daily.   Yes [provider]  vitamin C (ASCORBIC ACID) 500 MG tablet Take 500 mg by mouth daily.   Yes [provider]  VITAMIN D PO Take 5,000 Units by mouth daily.    Yes [provider]  diazepam (VALIUM) 5 MG tablet Take 1 by mouth 1 hour  pre-procedure with very light food. May bring 2nd tablet to appointment. 09/14/19   Magnus Sinning, MD    ROS:  Out of a complete 14 system review of symptoms, the patient complains only of the following symptoms, and all other reviewed systems are negative.  Back pain, knee discomfort Visual changes Memory problems Headache  Blood pressure 127/78, pulse 89, height '5\' 5"'  (1.651 m), weight 205 lb 8 oz (93.2 kg).  Physical Exam  General: The patient is alert and cooperative at the time of the examination.  Skin: No significant peripheral edema is noted.   Neurologic Exam  Mental status: The patient is alert and oriented x 3 at the time of the examination. The patient has apparent normal recent and remote memory, with an apparently normal attention span and concentration ability.   Cranial nerves: Facial symmetry is present. Speech is normal, no aphasia or dysarthria is noted. Extraocular movements are full. Visual fields are full, with exception of some decreased vision in the right superior quadrant, visual fields not completely normal in the right inferior quadrant but she is  able to pick up objects better in this field..  Motor: The patient has good strength in all 4 extremities.  Sensory examination: Soft touch sensation is symmetric on the face, arms, and legs.  Coordination: The patient has good finger-nose-finger and heel-to-shin bilaterally.  Gait and station: The patient has a normal gait. Tandem gait is slightly unsteady. Romberg is negative. No drift is seen.  Reflexes: Deep tendon reflexes are symmetric.   Assessment/Plan:  1.  History of cerebrovascular disease, left temporal occipital stroke  2.  Methylenetetrahydrofolate reductase deficiency  3.  Migraine headache  4.  Mild memory changes  5.  History of seizures, well controlled  6.  Chronic low back pain  The patient overall is doing fairly well with her migraine, she is wearing off of the Aimovig injection prior to her next injection, we will split the dose of the injections to 70 mg every 14 days.  The patient used Nurtec if needed.  Prescriptions for these medications were written.  The patient will remain on Topamax and Aricept, she will follow-up here in 6 months.  Jill Alexanders MD 11/18/2019 10:53 AM  [facility] [address] [place], [place] [address]  Phone [phone] Fax [phone]

## 2019-11-21 DIAGNOSIS — E063 Autoimmune thyroiditis: Secondary | ICD-10-CM | POA: Diagnosis not present

## 2019-11-21 DIAGNOSIS — M1711 Unilateral primary osteoarthritis, right knee: Secondary | ICD-10-CM | POA: Diagnosis not present

## 2019-11-21 DIAGNOSIS — E7849 Other hyperlipidemia: Secondary | ICD-10-CM | POA: Diagnosis not present

## 2019-11-21 DIAGNOSIS — M797 Fibromyalgia: Secondary | ICD-10-CM | POA: Diagnosis not present

## 2019-11-23 ENCOUNTER — Other Ambulatory Visit: Payer: Self-pay

## 2019-11-23 ENCOUNTER — Ambulatory Visit: Payer: PPO | Admitting: Rheumatology

## 2019-11-23 ENCOUNTER — Encounter: Payer: Self-pay | Admitting: Rheumatology

## 2019-11-23 VITALS — BP 102/69 | HR 62 | Resp 16 | Ht 65.0 in | Wt 209.6 lb

## 2019-11-23 DIAGNOSIS — Z8672 Personal history of thrombophlebitis: Secondary | ICD-10-CM

## 2019-11-23 DIAGNOSIS — Z8673 Personal history of transient ischemic attack (TIA), and cerebral infarction without residual deficits: Secondary | ICD-10-CM | POA: Diagnosis not present

## 2019-11-23 DIAGNOSIS — M7061 Trochanteric bursitis, right hip: Secondary | ICD-10-CM

## 2019-11-23 DIAGNOSIS — Q211 Atrial septal defect: Secondary | ICD-10-CM

## 2019-11-23 DIAGNOSIS — Z8639 Personal history of other endocrine, nutritional and metabolic disease: Secondary | ICD-10-CM

## 2019-11-23 DIAGNOSIS — M19072 Primary osteoarthritis, left ankle and foot: Secondary | ICD-10-CM

## 2019-11-23 DIAGNOSIS — Z96653 Presence of artificial knee joint, bilateral: Secondary | ICD-10-CM | POA: Diagnosis not present

## 2019-11-23 DIAGNOSIS — M19041 Primary osteoarthritis, right hand: Secondary | ICD-10-CM | POA: Diagnosis not present

## 2019-11-23 DIAGNOSIS — M19042 Primary osteoarthritis, left hand: Secondary | ICD-10-CM

## 2019-11-23 DIAGNOSIS — M5134 Other intervertebral disc degeneration, thoracic region: Secondary | ICD-10-CM

## 2019-11-23 DIAGNOSIS — Z8669 Personal history of other diseases of the nervous system and sense organs: Secondary | ICD-10-CM | POA: Diagnosis not present

## 2019-11-23 DIAGNOSIS — M5136 Other intervertebral disc degeneration, lumbar region: Secondary | ICD-10-CM

## 2019-11-23 DIAGNOSIS — Z9884 Bariatric surgery status: Secondary | ICD-10-CM

## 2019-11-23 DIAGNOSIS — M62838 Other muscle spasm: Secondary | ICD-10-CM

## 2019-11-23 DIAGNOSIS — M797 Fibromyalgia: Secondary | ICD-10-CM | POA: Diagnosis not present

## 2019-11-23 DIAGNOSIS — M19071 Primary osteoarthritis, right ankle and foot: Secondary | ICD-10-CM | POA: Diagnosis not present

## 2019-11-23 DIAGNOSIS — M7062 Trochanteric bursitis, left hip: Secondary | ICD-10-CM

## 2019-11-23 DIAGNOSIS — Q2112 Patent foramen ovale: Secondary | ICD-10-CM

## 2019-11-27 ENCOUNTER — Ambulatory Visit: Payer: PPO | Admitting: Podiatry

## 2019-11-27 ENCOUNTER — Ambulatory Visit (INDEPENDENT_AMBULATORY_CARE_PROVIDER_SITE_OTHER): Payer: PPO

## 2019-11-27 ENCOUNTER — Other Ambulatory Visit: Payer: Self-pay

## 2019-11-27 DIAGNOSIS — M21611 Bunion of right foot: Secondary | ICD-10-CM

## 2019-11-27 DIAGNOSIS — M2031 Hallux varus (acquired), right foot: Secondary | ICD-10-CM | POA: Diagnosis not present

## 2019-11-27 DIAGNOSIS — Z01818 Encounter for other preprocedural examination: Secondary | ICD-10-CM

## 2019-11-27 DIAGNOSIS — M2011 Hallux valgus (acquired), right foot: Secondary | ICD-10-CM

## 2019-11-27 DIAGNOSIS — M21612 Bunion of left foot: Secondary | ICD-10-CM

## 2019-12-01 ENCOUNTER — Encounter: Payer: Self-pay | Admitting: Podiatry

## 2019-12-01 NOTE — Progress Notes (Signed)
Subjective:  Patient ID: Debbie Pineda, female    DOB: 01/19/1961,  MRN: [id number]  Chief Complaint  Patient presents with  . Bunions    B/L- causing pain and discomfort- previous surgery on right foot- bunion was shaved dwon- no improvement    59 y.o. female presents with the above complaint.  Patient presents with complaint of bunion pain to the right foot that has been going on for a long time.  Patient was being treated by a previous physician for which he did a silver procedure to counter shave the bump up but did not address the underlying deformity of the foot.  She states that it still hurts has not gotten any better.  She states her bunion is pretty severe.  She is tried offloading it made shoe gear modification none of which has helped.  At this time she would like to discuss surgical treatment options as she has exhausted all conservative treatment options by previous doctor.  She denies any other acute complaints.  She would also like to discuss lapiplasty as a surgical option.  Review of Systems: Negative except as noted in the HPI. Denies N/V/F/Ch.  Past Medical History:  Diagnosis Date  . ADD (attention deficit disorder)    takes Adderall daily  . Arthritis    "all over"  . Cerebral infarction ([place]) 10/30/2011  . Cerebrovascular disease 08/14/2016  . Chronic back pain    "all over"  . Chronic low back pain 01/11/2016  . Complication of anesthesia    tends to have hypotension when NPO and post-anesthesia  . Constipation    takes stool softener daily  . Degenerative disk disease   . Degenerative joint disease   . Depression    takes Cymbalta daily for pain per pt  . Diabetes mellitus without complication ([place])   . DVT (deep venous thrombosis) (HCC)    RLE  . Family history of adverse reaction to anesthesia    a family member woke up during surgery; "think it was my mom"  . Fibromyalgia   . Generalized osteoarthritis of multiple sites 11/04/2013  . GERD  (gastroesophageal reflux disease)   . Heart palpitations 12/14/2013  . History of blood clots    superficial  . Hypoglycemia   . Hypothyroid    takes Synthroid daily  . Incomplete emptying of bladder   . Insomnia    takes Trazodone nightly  . Iron deficiency anemia    takes Ferrous Sulfate daily  . Joint pain   . Joint swelling    knees and ankles  . Memory disorder 08/14/2016  . Morbid obesity ([place])   . Nausea    takes Zofran as needed.Seeing GI doc  . Neck pain 05/25/2016  . OSA on CPAP    tested more than 5 yrs ago.    . Osteoarthritis   . PFO (patent foramen ovale)   . Primary osteoarthritis of both feet 05/29/2016   Right bunionectomy August 2017 by Dr. Sharol Given  . Scoliosis   . Sleep apnea   . Stroke [facility]) "several"   right foot weakness; memory issues, black spot right visual field since" (03/23/2015)  . Thrombophlebitis   . Trochanteric bursitis of both hips 05/25/2016  . Unilateral primary osteoarthritis, right knee 05/29/2016  . Urinary urgency 04/19/2016  . Vein disorder 11/29/2010    Current Outpatient Medications:  .  amoxicillin (AMOXIL) 500 MG capsule, Take 2,000 mg by mouth See admin instructions. Take 2000 mg by mouth 1 hour prior to  dental appointment, Disp: , Rfl:  .  baclofen (LIORESAL) 10 MG tablet, Take 10 mg by mouth 3 (three) times daily. , Disp: , Rfl: 1 .  bisacodyl (DULCOLAX) 5 MG EC tablet, Take 5 mg by mouth daily as needed for moderate constipation., Disp: , Rfl:  .  Cyanocobalamin (VITAMIN B-12) 5000 MCG SUBL, Place 5,000 mcg under the tongue daily. , Disp: , Rfl:  .  diazepam (VALIUM) 5 MG tablet, Take 1 by mouth 1 hour  pre-procedure with very light food. May bring 2nd tablet to appointment. (Patient not taking: Reported on 11/23/2019), Disp: 2 tablet, Rfl: 0 .  diclofenac sodium (VOLTAREN) 1 % GEL, Apply 4 g topically 4 (four) times daily as needed (PAIN)., Disp: 5 Tube, Rfl: 1 .  docusate sodium (COLACE) 100 MG capsule, Take 100 mg by mouth as needed for  mild constipation., Disp: , Rfl:  .  donepezil (ARICEPT) 10 MG tablet, Take 1 tablet (10 mg total) by mouth at bedtime., Disp: 90 tablet, Rfl: 3 .  DULoxetine (CYMBALTA) 60 MG capsule, Take 60 mg by mouth 2 (two) times daily., Disp: , Rfl: 11 .  Erenumab-aooe (AIMOVIG) 70 MG/ML SOAJ, Inject 70 mg into the skin every 14 (fourteen) days., Disp: 2.24 mL, Rfl: 5 .  famotidine (PEPCID) 20 MG tablet, Take 20 mg by mouth daily., Disp: , Rfl:  .  fluconazole (DIFLUCAN) 150 MG tablet, Take 150 mg by mouth daily as needed (yeast infection). , Disp: , Rfl:  .  folic acid (FOLVITE) 235 MCG tablet, Take 400 mcg by mouth daily., Disp: , Rfl:  .  gabapentin (NEURONTIN) 800 MG tablet, Take 1 tablet (800 mg total) by mouth 3 (three) times daily., Disp: , Rfl:  .  IBUPROFEN PO, Take by mouth as needed., Disp: , Rfl:  .  Krill Oil 350 MG CAPS, Take 350 mg by mouth at bedtime. , Disp: , Rfl:  .  L-Methylfolate-B12-B6-B2 (METAFOLBIC) 06-22-48-5 MG TABS, Take 1 tablet by mouth daily., Disp: 90 tablet, Rfl: 3 .  levothyroxine (SYNTHROID) 150 MCG tablet, Take 150 mcg by mouth daily before breakfast., Disp: , Rfl:  .  methylphenidate (CONCERTA) 27 MG PO CR tablet, Take 1 tablet (27 mg total) by mouth 2 (two) times daily., Disp: 60 tablet, Rfl: 0 .  mirabegron ER (MYRBETRIQ) 50 MG TB24 tablet, Take 50 mg by mouth daily., Disp: , Rfl:  .  Multiple Minerals-Vitamins (CAL-MAG-ZINC-D PO), Take 3 tablets by mouth daily. , Disp: , Rfl:  .  Multiple Vitamins-Minerals (ONE-A-DAY WOMENS PETITES PO), Take 1 tablet by mouth 2 (two) times daily. , Disp: , Rfl:  .  NARCAN 4 MG/0.1ML LIQD nasal spray kit, Place 0.4 mg into the nose once. , Disp: , Rfl:  .  NUCYNTA 50 MG tablet, Take 50 mg by mouth 3 (three) times daily., Disp: , Rfl:  .  omeprazole (PRILOSEC) 40 MG capsule, Take 1 capsule (40 mg total) by mouth daily before breakfast., Disp: 90 capsule, Rfl: 3 .  ondansetron (ZOFRAN-ODT) 4 MG disintegrating tablet, Take 4 mg by mouth  every 8 (eight) hours as needed for nausea or vomiting., Disp: , Rfl:  .  Pseudoephedrine HCl (SUDAFED PO), Take by mouth as needed., Disp: , Rfl:  .  Rimegepant Sulfate (NURTEC) 75 MG TBDP, Take 75 mg by mouth daily as needed., Disp: 8 tablet, Rfl: 2 .  rivaroxaban (XARELTO) 20 MG TABS tablet, Take 1 tablet (20 mg total) by mouth at bedtime., Disp: , Rfl:  .  Simethicone (PHAZYME ULTRA STRENGTH) 180 MG CAPS, Take 1 capsule (180 mg total) by mouth 3 (three) times daily as needed., Disp: , Rfl: 0 .  solifenacin (VESICARE) 5 MG tablet, at bedtime. , Disp: , Rfl:  .  tiZANidine (ZANAFLEX) 4 MG tablet, Take 1 tablet (4 mg total) by mouth every 6 (six) hours as needed for muscle spasms., Disp: 30 tablet, Rfl: 0 .  topiramate (TOPAMAX) 100 MG tablet, Take 1 tablet (100 mg total) by mouth 2 (two) times daily., Disp: 180 tablet, Rfl: 3 .  traZODone (DESYREL) 100 MG tablet, Take 200 mg by mouth at bedtime. , Disp: , Rfl:  .  Turmeric (QC TUMERIC COMPLEX PO), Take by mouth 2 (two) times daily., Disp: , Rfl:  .  vitamin C (ASCORBIC ACID) 500 MG tablet, Take 500 mg by mouth daily., Disp: , Rfl:  .  VITAMIN D PO, Take 5,000 Units by mouth daily. , Disp: , Rfl:   Social History   Tobacco Use  Smoking Status Former Smoker  . Packs/day: 0.75  . Years: 8.00  . Pack years: 6.00  . Types: Cigarettes  . Quit date: 12/01/1990  . Years since quitting: 29.0  Smokeless Tobacco Never Used  Tobacco Comment   quit smoking in the 1990s    Allergies  Allergen Reactions  . Lyrica [Pregabalin] Shortness Of Breath and Swelling    lower extremity edema and weight gain  . Belsomra [Suvorexant] Other (See Comments)    unknown  . Morphine And Related Itching    Upper torso  . Sulfamethoxazole-Trimethoprim Itching and Rash    Bactrim  . Tape Itching and Rash    Please use "paper" tape Rash if left on longer than 24 hrs   Objective:  There were no vitals filed for this visit. There is no height or weight on  file to calculate BMI. Constitutional Well developed. Well nourished.  Vascular Dorsalis pedis pulses palpable bilaterally. Posterior tibial pulses palpable bilaterally. Capillary refill normal to all digits.  No cyanosis or clubbing noted. Pedal hair growth normal.  Neurologic Normal speech. Oriented to person, place, and time. Epicritic sensation to light touch grossly present bilaterally.  Dermatologic Nails well groomed and normal in appearance. No open wounds. No skin lesions.  Orthopedic: Normal joint ROM without pain or crepitus bilaterally. Hallux abductovalgus deformity present Left 1st MPJ full range of motion. Left 1st TMT without gross hypermobility. Right 1st MPJ diminished range of motion  Right 1st TMT with gross hypermobility. Lesser digital contractures present right semiflexible hammertoe contracture of right second and third digit noted with dorsiflexion of the PIPJ plantar flexion of the DIPJ.  Mild metatarsophalangeal joint contracture of the second and third digit noted.   Radiographs: Taken and reviewed. 3 views of skeletally mature adult foot right foot: Previous procedure noted to the medial aspect of the hallux MPJ.  Mild osteoarthritic changes noted at the first metatarsophalangeal joint.  Diffuse osteopenia noted as well.  There is severe bunion deformity with sesamoid position of 6 out of 7.  Atavistic cuneiform noted of the medial.  Metatarsal parabola within normal limits.  Hallux valgus noted there is severe increase in intermetatarsal angle.   Assessment:   1. Hallux valgus (acquired), right foot   2. Hallux hammertoe, right   3. Preoperative examination    Plan:  Patient was evaluated and treated and all questions answered.  Hallux abductovalgus deformity with hammertoe contracture of second and third digit with underlying metatarsophalangeal joint contracture as well. -  XR as above. -Patient has failed all conservative therapy and wishes to  proceed with surgical intervention. All risks, benefits, and alternatives discussed with patient. No guarantees given. Consent reviewed and signed by patient. Post-op course explained at length. -Planned procedures: Right Lapidus first tarsometatarsal joint fusion with Lapiplasty procedure and correction of hammertoe with PIPJ arthroplasty of second and third digit -Risk factors: Osteopenia mild -I explained to the patient in extensive detail about my surgical plan as well as the postoperative plan.  I discussed with the patient she will benefit from not be plasty with the first tarsometatarsal fusion with reduction of the first metatarsal and a possible Akin osteotomy.  Patient agrees with the plan would like to proceed with the surgery. -I discussed with her that she will be nonweightbearing to the right foot with a cam boot followed by weightbearing as tolerated cam boot and then into regular shoes.  She states understanding -Informed surgical risk consent was reviewed and read aloud to the patient.  I reviewed the films.  I have discussed my findings with the patient in great detail.  I have discussed all risks including but not limited to infection, stiffness, scarring, limp, disability, deformity, damage to blood vessels and nerves, numbness, poor healing, need for braces, arthritis, chronic pain, amputation, death.  All benefits and realistic expectations discussed in great detail.  I have made no promises as to the outcome.  I have provided realistic expectations.  I have offered the patient a 2nd opinion, which they have declined and assured me they preferred to proceed despite the risks -A total of 46 minutes was spent in direct patient care as well as pre and post patient encounter activities.  This includes documentation as well as reviewing patient chart for labs, imaging, past medical, surgical, social, and family history as documented in the EMR.  I have reviewed medication allergies as  documented in EMR.  I discussed the etiology of condition and treatment options from conservative to surgical care.  All risks and benefit of the treatment course was discussed in detail.  All questions were answered and return appointment was discussed.  Since the visit completed in an ambulatory/outpatient setting, the patient and/or parent/guardian has been advised to contact the providers office for worsening condition and seek medical treatment and/or call 911 if the patient deems either is necessary.    No follow-ups on file.

## 2019-12-03 ENCOUNTER — Encounter: Payer: Self-pay | Admitting: Podiatry

## 2019-12-07 ENCOUNTER — Encounter: Payer: Self-pay | Admitting: Specialist

## 2019-12-09 ENCOUNTER — Other Ambulatory Visit: Payer: Self-pay | Admitting: Radiology

## 2019-12-09 DIAGNOSIS — M4155 Other secondary scoliosis, thoracolumbar region: Secondary | ICD-10-CM

## 2019-12-09 DIAGNOSIS — M5136 Other intervertebral disc degeneration, lumbar region: Secondary | ICD-10-CM

## 2019-12-09 DIAGNOSIS — M455 Ankylosing spondylitis of thoracolumbar region: Secondary | ICD-10-CM

## 2019-12-14 ENCOUNTER — Encounter: Payer: Self-pay | Admitting: Specialist

## 2019-12-22 ENCOUNTER — Encounter: Payer: Self-pay | Admitting: Neurology

## 2019-12-24 DIAGNOSIS — M791 Myalgia, unspecified site: Secondary | ICD-10-CM | POA: Diagnosis not present

## 2019-12-24 DIAGNOSIS — Z6834 Body mass index (BMI) 34.0-34.9, adult: Secondary | ICD-10-CM | POA: Diagnosis not present

## 2019-12-24 DIAGNOSIS — G4730 Sleep apnea, unspecified: Secondary | ICD-10-CM | POA: Diagnosis not present

## 2019-12-24 DIAGNOSIS — M797 Fibromyalgia: Secondary | ICD-10-CM | POA: Diagnosis not present

## 2019-12-25 ENCOUNTER — Ambulatory Visit: Payer: PPO | Admitting: Rheumatology

## 2019-12-28 MED ORDER — METAFOLBIC 6-1-50-5 MG PO TABS: 1.0000 | ORAL_TABLET | Freq: Every day | ORAL | 3 refills | Status: DC

## 2020-01-08 DIAGNOSIS — F5231 Female orgasmic disorder: Secondary | ICD-10-CM | POA: Diagnosis not present

## 2020-01-08 DIAGNOSIS — N8111 Cystocele, midline: Secondary | ICD-10-CM | POA: Diagnosis not present

## 2020-01-08 DIAGNOSIS — M797 Fibromyalgia: Secondary | ICD-10-CM | POA: Diagnosis not present

## 2020-01-08 DIAGNOSIS — N941 Unspecified dyspareunia: Secondary | ICD-10-CM | POA: Diagnosis not present

## 2020-01-08 DIAGNOSIS — N952 Postmenopausal atrophic vaginitis: Secondary | ICD-10-CM | POA: Diagnosis not present

## 2020-01-11 ENCOUNTER — Encounter: Payer: Self-pay | Admitting: Cardiovascular Disease

## 2020-01-11 ENCOUNTER — Other Ambulatory Visit: Payer: Self-pay

## 2020-01-11 ENCOUNTER — Ambulatory Visit: Payer: PPO | Admitting: Cardiovascular Disease

## 2020-01-11 ENCOUNTER — Ambulatory Visit: Payer: PPO | Admitting: Specialist

## 2020-01-11 VITALS — BP 116/80 | HR 65 | Resp 16 | Ht 65.0 in | Wt 206.6 lb

## 2020-01-11 DIAGNOSIS — Q211 Atrial septal defect: Secondary | ICD-10-CM | POA: Diagnosis not present

## 2020-01-11 DIAGNOSIS — R609 Edema, unspecified: Secondary | ICD-10-CM

## 2020-01-11 DIAGNOSIS — Z01810 Encounter for preprocedural cardiovascular examination: Secondary | ICD-10-CM | POA: Diagnosis not present

## 2020-01-11 DIAGNOSIS — Q2112 Patent foramen ovale: Secondary | ICD-10-CM

## 2020-01-11 NOTE — Progress Notes (Signed)
Date:  01/11/2020   ID:  Debbie Pineda, DOB 02/11/1960, MRN [id number]   PCP:  Jake Samples, PA-C  Cardiologist:  Kate Sable, MD (Inactive) New to me  Electrophysiologist:  None   Evaluation Performed:  Follow-Up Visit  Chief Complaint:  PFO  History of Present Illness:     59 y.o. previously seen by Dr Bronson Ing. History of factor V Leiden deficiency with CVA on chronic xarelto. Residual right Sided weakness ? PFO at [facility] 2013 but not seen on more recent echos and has not had PFO closure   I reviewed the echocardiogram performed on 05/02/2017 which demonstrated normal left ventricular systolic function, EF 60 to 65%.no bubble study done   She denies chest pain, palpitations, orthopnea, and shortness of breath. Has some LE leg/foot pains ? 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 Lives with husband and cares For her mother Has one daughter lives locally with two grand kids ages 82, 14  The patient does not have symptoms concerning for COVID-19 infection (fever, chills, cough, or new shortness of breath).    Past Medical History:  Diagnosis Date  . ADD (attention deficit disorder)    takes Adderall daily  . Arthritis    "all over"  . Cerebral infarction ([place]) 10/30/2011  . Cerebrovascular disease 08/14/2016  . Chronic back pain    "all over"  . Chronic low back pain 01/11/2016  . Complication of anesthesia    tends to have hypotension when NPO and post-anesthesia  . Constipation    takes stool softener daily  . Degenerative disk disease   . Degenerative joint disease   . Depression    takes Cymbalta daily for pain per pt  . Diabetes mellitus without complication ([place])   . DVT (deep venous thrombosis) (H[facility])    RLE  . Family history of adverse reaction  to anesthesia    a family member woke up during surgery; "think it was my mom"  . Fibromyalgia   . Generalized osteoarthritis of multiple sites 11/04/2013  . GERD (gastroesophageal reflux disease)   . Heart palpitations 12/14/2013  . History of blood clots    superficial  . Hypoglycemia   . Hypothyroid    takes Synthroid daily  . Incomplete emptying of bladder   . Insomnia    takes Trazodone nightly  . Iron deficiency anemia    takes Ferrous Sulfate daily  . Joint pain   . Joint swelling    knees and ankles  . Memory disorder 08/14/2016  . Morbid obesity ([place])   . Nausea    takes Zofran as needed.Seeing GI doc  . Neck pain 05/25/2016  . OSA on CPAP    tested more than 5 yrs ago.    . Osteoarthritis   . PFO (patent foramen ovale)   . Primary osteoarthritis of both feet 05/29/2016   Right bunionectomy August 2017 by Dr. Sharol Given  . Scoliosis   . Sleep apnea   . Stroke [facility]) "several"   right foot weakness; memory issues, black spot right visual field since" (03/23/2015)  . Thrombophlebitis   . Trochanteric bursitis of both hips 05/25/2016  . Unilateral primary osteoarthritis, right knee 05/29/2016  . Urinary urgency 04/19/2016  . Vein disorder 11/29/2010   Past Surgical History:  Procedure Laterality Date  .  BIOPSY  11/14/2018   Procedure: BIOPSY;  Surgeon: Rogene Houston, MD;  [facility];  Service: Endoscopy;;  esophagus gastric  . BONE EXCISION Right 08/29/2017   Procedure: right trapezium excision;  Surgeon: Daryll Brod, MD;  Location: [place];  Service: Orthopedics;  Laterality: Right;  . BUNIONECTOMY Right 08/2015  . CARDIAC CATHETERIZATION     2008.  "it was fine" (not sure why she had it done, and doesn't know where)  . CARPOMETACARPEL SUSPENSION PLASTY Right 08/29/2017   Procedure: SUSPENSION PLASTY RIGHT THUMB;  Surgeon: Daryll Brod, MD;  Location: [place];  Service: Orthopedics;  Laterality: Right;  . COLONOSCOPY N/A 03/25/2013    Procedure: COLONOSCOPY;  Surgeon: Rogene Houston, MD;  [facility];  Service: Endoscopy;  Laterality: N/A;  930  . ESOPHAGOGASTRODUODENOSCOPY    . ESOPHAGOGASTRODUODENOSCOPY (EGD) WITH PROPOFOL N/A 11/14/2018   Procedure: ESOPHAGOGASTRODUODENOSCOPY (EGD) WITH PROPOFOL;  Surgeon: Rogene Houston, MD;  [facility];  Service: Endoscopy;  Laterality: N/A;  1:55pm-office moved to 11:00am/pt notified to arrive at 9:30am per KF  . EXPLORATORY LAPAROTOMY     "took fallopian tubes out"  . JOINT REPLACEMENT     bil knee   . KNEE ARTHROPLASTY    . KNEE ARTHROSCOPY Left   . KNEE ARTHROSCOPY W/ ACL RECONSTRUCTION Right    "added pins"  . LAPAROSCOPIC CHOLECYSTECTOMY  ~ 2001  . 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 SPINAL CORD STIMULATOR INSERTION N/A 04/18/2017   Procedure: LUMBAR SPINAL CORD STIMULATOR INSERTION;  Surgeon: Clydell Hakim, MD;  Location: [place];  Service: Neurosurgery;  Laterality: N/A;  LUMBAR SPINAL CORD STIMULATOR INSERTION  . TENDON TRANSFER Right 08/29/2017   Procedure: right abductor pollicis longus transfer;  Surgeon: Daryll Brod, MD;  Location: [place];  Service: Orthopedics;  Laterality: Right;  . TOTAL KNEE ARTHROPLASTY Left 03/23/2015   Procedure: TOTAL KNEE ARTHROPLASTY;  Surgeon: Newt Minion, MD;  Location: [place];  Service: Orthopedics;  Laterality: Left;  . TOTAL KNEE ARTHROPLASTY Right 08/15/2016   Procedure: RIGHT TOTAL KNEE ARTHROPLASTY, REMOVAL ACL SCREWS;  Surgeon: Newt Minion, MD;  Location: [place];  Service: Orthopedics;  Laterality: Right;  . TOTAL KNEE ARTHROPLASTY WITH HARDWARE REMOVAL Right   . VAGINAL HYSTERECTOMY    . VARICOSE VEIN SURGERY Right X 2     No outpatient medications have been marked as taking for the 01/11/20 encounter (Appointment) with Josue Hector, MD.     Allergies:   Lyrica [pregabalin], Belsomra [suvorexant], Morphine and related, Sulfamethoxazole-trimethoprim, and Tape   Social History    Tobacco Use  . Smoking status: Former Smoker    Packs/day: 0.75    Years: 8.00    Pack years: 6.00    Types: Cigarettes    Quit date: 12/01/1990    Years since quitting: 29.1  . Smokeless tobacco: Never Used  . Tobacco comment: quit smoking in the 1990s  Vaping Use  . Vaping Use: Never used  Substance Use Topics  . Alcohol use: No    Comment: 03/23/2015 "stopped drinking in 2012 w/gastric bypass; drank socially before bypass"  . Drug use: No     Family Hx: The patient's family history includes Cancer in her brother, father, maternal grandfather, and mother; Diabetes in her brother; Heart attack in her sister; Heart disease in her brother, father, and sister; Hypertension in an other family member; Hypothyroidism in her daughter; Myasthenia gravis in her mother;  Parkinson's disease in her father; Stroke in her brother. There is no history of Colon cancer.  ROS:   Please see the history of present illness.     All other systems reviewed and are negative.   Prior CV studies:   The following studies were reviewed today:  TTE 20219  Notes from Dr Raliegh Ip  Labs/Other Tests and Data Reviewed:    EKG:   01/11/2020 NSR rate 66 normal   Recent Labs: 09/22/2019: ALT 27   Recent Lipid Panel Lab Results  Component Value Date/Time   CHOL 151 05/02/2017 04:19 AM   TRIG 54 05/02/2017 04:19 AM   HDL 74 05/02/2017 04:19 AM   CHOLHDL 2.0 05/02/2017 04:19 AM   LDLCALC 66 05/02/2017 04:19 AM    Wt Readings from Last 3 Encounters:  11/23/19 95.1 kg  11/18/19 93.2 kg  11/13/19 93 kg     Objective:    Vital Signs:  There were no vitals taken for this visit.   Affect appropriate Healthy:  appears stated age 30: normal Neck supple with no adenopathy JVP normal no bruits no thyromegaly Lungs clear with no wheezing and good diaphragmatic motion Heart:  S1/S2 no murmur, no rub, gallop or click PMI normal Abdomen: benighn, BS positve, no tenderness, no AAA no bruit.  No HSM or  HJR Distal pulses intact with no bruits No edema Neuro residual right sided weakness from CVA  Skin warm and dry No muscular weakness Bunion on right foot    ASSESSMENT & PLAN:    1.  PFO historical diagnosis not seen on TTE 2019 Has been 8 years since placed on anticoagulation with no recurrence will order TTE with bubble study to assess   2.  Bilateral venous varicosities: documented reflux has some fibromyalgia and does not like wearing compression stockings stable   3. Thyroid:  On replacement labs with primary   4. Ortho:  Right bunion ok to hold xarelto 2 days before and proceed with surgery in January     COVID-19 Education: The signs and symptoms of COVID-19 were discussed with the patient and how to seek care for testing (follow up with PCP or arrange E-visit).  The importance of social distancing was discussed today.  Time:   Today, I have spent 15 minutes with the patient with telehealth technology discussing the above problems.     Medication Adjustments/Labs and Tests Ordered: Current medicines are reviewed at length with the patient today.  Concerns regarding medicines are outlined above.   Tests Ordered: No orders of the defined types were placed in this encounter.   Medication Changes: No orders of the defined types were placed in this encounter.   Disposition:  Follow up in 1 year(s)  Signed, Jenkins Rouge, MD  01/11/2020 11:59 AM    [facility]

## 2020-01-11 NOTE — Patient Instructions (Signed)
Medication Instructions:  Your physician recommends that you continue on your current medications as directed. Please refer to the Current Medication list given to you today.  *If you need a refill on your cardiac medications before your next appointment, please call your pharmacy*   Lab Work: NONE  If you have labs (blood work) drawn today and your tests are completely normal, you will receive your results only by: MyChart Message (if you have MyChart) OR A paper copy in the mail If you have any lab test that is abnormal or we need to change your treatment, we will call you to review the results.   Testing/Procedures: Your physician has requested that you have an echocardiogram. Echocardiography is a painless test that uses sound waves to create images of your heart. It provides your doctor with information about the size and shape of your heart and how well your heart's chambers and valves are working. This procedure takes approximately one hour. There are no restrictions for this procedure.   Follow-Up: At [facility], you and your health needs are our priority.  As part of our continuing mission to provide you with exceptional heart care, we have created designated Provider Care Teams.  These Care Teams include your primary Cardiologist (physician) and Advanced Practice Providers (APPs -  Physician Assistants and Nurse Practitioners) who all work together to provide you with the care you need, when you need it.  We recommend signing up for the patient portal called "MyChart".  Sign up information is provided on this After Visit Summary.  MyChart is used to connect with patients for Virtual Visits (Telemedicine).  Patients are able to view lab/test results, encounter notes, upcoming appointments, etc.  Non-urgent messages can be sent to your provider as well.   To learn more about what you can do with MyChart, go to [email or web address].    Your next appointment:   6  month(s)  The format for your next appointment:   In Person  Provider:   Peter Nishan, MD   Other Instructions Thank you for choosing [facility]! '  

## 2020-01-18 ENCOUNTER — Ambulatory Visit (HOSPITAL_COMMUNITY)
Admission: RE | Admit: 2020-01-18 | Discharge: 2020-01-18 | Disposition: A | Discharge status: Home / Self Care | Payer: PPO | Source: Ambulatory Visit | Attending: Cardiovascular Disease | Admitting: Cardiovascular Disease

## 2020-01-18 ENCOUNTER — Other Ambulatory Visit: Payer: Self-pay

## 2020-01-18 ENCOUNTER — Encounter: Payer: Self-pay | Admitting: Specialist

## 2020-01-18 DIAGNOSIS — Q2112 Patent foramen ovale: Secondary | ICD-10-CM

## 2020-01-18 DIAGNOSIS — Q211 Atrial septal defect: Secondary | ICD-10-CM | POA: Insufficient documentation

## 2020-01-18 LAB — ECHOCARDIOGRAM COMPLETE BUBBLE STUDY
Area-P 1/2: 3.08 cm2
S' Lateral: 2.55 cm

## 2020-01-18 NOTE — Progress Notes (Addendum)
*  PRELIMINARY RESULTS* Echocardiogram 2D Echocardiogram with bubble study has been performed.  Debbie Pineda 01/18/2020, 1:03 PM

## 2020-01-19 ENCOUNTER — Encounter: Payer: Self-pay | Admitting: Neurology

## 2020-01-19 ENCOUNTER — Other Ambulatory Visit: Payer: Self-pay | Admitting: *Deleted

## 2020-01-19 MED ORDER — METAFOLBIC 6-1-50-5 MG PO TABS: 1.0000 | ORAL_TABLET | Freq: Every day | ORAL | 3 refills | Status: DC

## 2020-01-19 NOTE — Telephone Encounter (Signed)
Pt here for prescription. This was redone and will take to pharmacy and use [facility].

## 2020-01-21 ENCOUNTER — Ambulatory Visit: Payer: Self-pay

## 2020-01-21 ENCOUNTER — Encounter: Payer: Self-pay | Admitting: Specialist

## 2020-01-21 ENCOUNTER — Other Ambulatory Visit: Payer: Self-pay

## 2020-01-21 ENCOUNTER — Ambulatory Visit: Payer: PPO | Admitting: Specialist

## 2020-01-21 VITALS — BP 106/73 | HR 57 | Ht 65.0 in | Wt 206.7 lb

## 2020-01-21 DIAGNOSIS — M4805 Spinal stenosis, thoracolumbar region: Secondary | ICD-10-CM

## 2020-01-21 DIAGNOSIS — M7062 Trochanteric bursitis, left hip: Secondary | ICD-10-CM | POA: Diagnosis not present

## 2020-01-21 DIAGNOSIS — F325 Major depressive disorder, single episode, in full remission: Secondary | ICD-10-CM | POA: Diagnosis not present

## 2020-01-21 DIAGNOSIS — M4726 Other spondylosis with radiculopathy, lumbar region: Secondary | ICD-10-CM

## 2020-01-21 DIAGNOSIS — E7849 Other hyperlipidemia: Secondary | ICD-10-CM | POA: Diagnosis not present

## 2020-01-21 DIAGNOSIS — M4156 Other secondary scoliosis, lumbar region: Secondary | ICD-10-CM

## 2020-01-21 DIAGNOSIS — M5136 Other intervertebral disc degeneration, lumbar region: Secondary | ICD-10-CM | POA: Diagnosis not present

## 2020-01-21 DIAGNOSIS — Z6834 Body mass index (BMI) 34.0-34.9, adult: Secondary | ICD-10-CM | POA: Diagnosis not present

## 2020-01-21 DIAGNOSIS — M455 Ankylosing spondylitis of thoracolumbar region: Secondary | ICD-10-CM | POA: Diagnosis not present

## 2020-01-21 DIAGNOSIS — M791 Myalgia, unspecified site: Secondary | ICD-10-CM | POA: Diagnosis not present

## 2020-01-21 DIAGNOSIS — M25572 Pain in left ankle and joints of left foot: Secondary | ICD-10-CM | POA: Diagnosis not present

## 2020-01-21 MED ORDER — METHYLPREDNISOLONE ACETATE 40 MG/ML IJ SUSP
40.0000 mg | INTRAMUSCULAR | Status: AC | PRN
  Administered 2020-01-21: 40 mg via INTRA_ARTICULAR

## 2020-01-21 MED ORDER — BUPIVACAINE HCL 0.5 % IJ SOLN
3.0000 mL | INTRAMUSCULAR | Status: AC | PRN
Start: 2020-01-21 — End: 2020-01-21
  Administered 2020-01-21: 3 mL via INTRA_ARTICULAR

## 2020-01-21 NOTE — Patient Instructions (Signed)
Avoid bending, stooping and avoid lifting weights greater than 10 lbs. Avoid prolong standing and walking. Order for a new walker with wheels. Surgery scheduling secretary Tivis Ringer, will call you in the next week to schedule for surgery.  Surgery recommended is a long thoracolumbar fusion T9 to S1 this would be done with rods, screws and cages with local bone graft and allograft (donor bone graft). Take hydrocodone for for pain. Risk of surgery includes risk of infection 1 in 200 patients, bleeding 20% chance you would need a transfusion.   Risk to the nerves is one in 10,000. You will need to use a brace for 3 months and wean from the brace on the 4th month. Expect improved walking and standing tolerance. Expect relief of leg pain but numbness may persist depending on the length and degree of pressure that has been present.  Hip Bursitis  Hip bursitis is swelling of a fluid-filled sac (bursa) in your hip joint. This swelling (inflammation) can be painful. This condition may come and go over time. What are the causes?  Injury to the hip.  Overuse of the muscles that surround the hip joint.  An earlier injury or surgery of the hip.  Arthritis or gout.  Diabetes.  Thyroid disease.  Infection.  In some cases, the cause may not be known. What are the signs or symptoms?  Mild or moderate pain in the hip area. Pain may get worse with movement.  Tenderness and swelling of the hip, especially on the outer side of the hip.  In rare cases, the bursa may become infected. This may cause: ? A fever. ? Warmth and redness in the area. Symptoms may come and go. How is this treated? This condition is treated by resting, icing, applying pressure (compression), and raising (elevating) the injured area. You may hear this called the RICE treatment. Treatment may also include:  Using crutches.  Draining fluid out of the bursa to help relieve swelling.  Giving a shot of (injecting)  medicine that helps to reduce swelling (cortisone).  Other medicines if the bursa is infected. Follow these instructions at home: Managing pain, stiffness, and swelling   If told, put ice on the painful area. ? Put ice in a plastic bag. ? Place a towel between your skin and the bag. ? Leave the ice on for 20 minutes, 2-3 times a day. ? Raise (elevate) your hip above the level of your heart as much as you can without pain. To do this, try putting a pillow under your hips while you lie down. Stop if this causes pain. Activity  Return to your normal activities as told by your doctor. Ask your doctor what activities are safe for you.  Rest and protect your hip as much as you can until you feel better. General instructions  Take over-the-counter and prescription medicines only as told by your doctor.  Wear wraps that put pressure on your hip (compression wraps) only as told by your doctor.  Do not use your hip to support your body weight until your doctor says that you can.  Use crutches as told by your doctor.  Gently rub and stretch your injured area as often as is comfortable.  Keep all follow-up visits as told by your doctor. This is important. How is this prevented?  Exercise regularly, as told by your doctor.  Warm up and stretch before being active.  Cool down and stretch after being active.  Avoid activities that bother your hip or  cause pain.  Avoid sitting down for long periods at a time. Contact a doctor if:  You have a fever.  You get new symptoms.  You have trouble walking.  You have trouble doing everyday activities.  You have pain that gets worse.  You have pain that does not get better with medicine.  You get red skin on your hip area.  You get a feeling of warmth in your hip area. Get help right away if:  You cannot move your hip.  You have very bad pain. Summary  Hip bursitis is swelling of a fluid-filled sac (bursa) in your hip.  Hip  bursitis can be painful.  Symptoms often come and go over time.  This condition is treated with rest, ice, compression, elevation, and medicines. This information is not intended to replace advice given to you by your health care provider. Make sure you discuss any questions you have with your health care provider. Document Revised: 09/16/2017 Document Reviewed: 09/16/2017 Elsevier Patient Education  [place].

## 2020-01-21 NOTE — Progress Notes (Signed)
Office Visit Note   Patient: Debbie Pineda           Date of Birth: 02-Feb-1960           MRN: [id number] Visit Date: 01/21/2020              Requested by: Jake Samples, PA-C [address] [place],  [place] [address] PCP: Jake Samples, PA-C   Assessment & Plan: Visit Diagnoses:  1. Lumbar degenerative disc disease   2. Spinal stenosis of thoracolumbar region   3. Other secondary scoliosis, lumbar region   4. Other spondylosis with radiculopathy, lumbar region   5. Ankylosing spondylitis of thoracolumbar region ([place])   6. Greater trochanteric bursitis, left     Plan:  Avoid bending, stooping and avoid lifting weights greater than 10 lbs. Avoid prolong standing and walking. Order for a new walker with wheels. Surgery scheduling secretary Kandice Hams, will call you in the next week to schedule for surgery.  Surgery recommended is a long thoracolumbar fusion T9 to S1 this would be done with rods, screws and cages with local bone graft and allograft (donor bone graft). Take hydrocodone for for pain. Risk of surgery includes risk of infection 1 in 200 patients, bleeding 20% chance you would need a transfusion.   Risk to the nerves is one in 10,000. You will need to use a brace for 3 months and wean from the brace on the 4th month. Expect improved walking and standing tolerance. Expect relief of leg pain but numbness may persist depending on the length and degree of pressure that has been present.  Hip Bursitis  Hip bursitis is swelling of a fluid-filled sac (bursa) in your hip joint. This swelling (inflammation) can be painful. This condition may come and go over time. What are the causes?  Injury to the hip.  Overuse of the muscles that surround the hip joint.  An earlier injury or surgery of the hip.  Arthritis or gout.  Diabetes.  Thyroid disease.  Infection.  In some cases, the cause may not be known. What are the signs or symptoms?  Mild  or moderate pain in the hip area. Pain may get worse with movement.  Tenderness and swelling of the hip, especially on the outer side of the hip.  In rare cases, the bursa may become infected. This may cause: ? A fever. ? Warmth and redness in the area. Symptoms may come and go. How is this treated? This condition is treated by resting, icing, applying pressure (compression), and raising (elevating) the injured area. You may hear this called the RICE treatment. Treatment may also include:  Using crutches.  Draining fluid out of the bursa to help relieve swelling.  Giving a shot of (injecting) medicine that helps to reduce swelling (cortisone).  Other medicines if the bursa is infected. Follow these instructions at home: Managing pain, stiffness, and swelling   If told, put ice on the painful area. ? Put ice in a plastic bag. ? Place a towel between your skin and the bag. ? Leave the ice on for 20 minutes, 2-3 times a day. ? Raise (elevate) your hip above the level of your heart as much as you can without pain. To do this, try putting a pillow under your hips while you lie down. Stop if this causes pain. Activity  Return to your normal activities as told by your doctor. Ask your doctor what activities are safe for you.  Rest and protect  your hip as much as you can until you feel better. General instructions  Take over-the-counter and prescription medicines only as told by your doctor.  Wear wraps that put pressure on your hip (compression wraps) only as told by your doctor.  Do not use your hip to support your body weight until your doctor says that you can.  Use crutches as told by your doctor.  Gently rub and stretch your injured area as often as is comfortable.  Keep all follow-up visits as told by your doctor. This is important. How is this prevented?  Exercise regularly, as told by your doctor.  Warm up and stretch before being active.  Cool down and stretch  after being active.  Avoid activities that bother your hip or cause pain.  Avoid sitting down for long periods at a time. Contact a doctor if:  You have a fever.  You get new symptoms.  You have trouble walking.  You have trouble doing everyday activities.  You have pain that gets worse.  You have pain that does not get better with medicine.  You get red skin on your hip area.  You get a feeling of warmth in your hip area. Get help right away if:  You cannot move your hip.  You have very bad pain. Summary  Hip bursitis is swelling of a fluid-filled sac (bursa) in your hip.  Hip bursitis can be painful.  Symptoms often come and go over time.  This condition is treated with rest, ice, compression, elevation, and medicines. This information is not intended to replace advice given to you by your health care provider. Make sure you discuss any questions you have with your health care provider. Document Revised: 09/16/2017 Document Reviewed: 09/16/2017 Elsevier Patient Education  2020 [employer].   Follow-Up Instructions: Return in about 3 months (around 04/20/2020).   Orders:  Orders Placed This Encounter  Procedures  . Large Joint Inj: L greater trochanter  . XR SCOLIOSIS EVAL COMPLETE SPINE 2 OR 3 VIEWS   No orders of the defined types were placed in this encounter.     Procedures: Large Joint Inj: L greater trochanter on 01/21/2020 11:01 AM Indications: pain Details: 22 G 3.5 in needle, superolateral approach  Arthrogram: No  Medications: 40 mg methylPREDNISolone acetate 40 MG/ML; 3 mL bupivacaine 0.5 % Outcome: tolerated well, no immediate complications Procedure, treatment alternatives, risks and benefits explained, specific risks discussed. Consent was given by the patient. Immediately prior to procedure a time out was called to verify the correct patient, procedure, equipment, support staff and site/side marked as required. Patient was prepped and  draped in the usual sterile fashion.       Clinical Data: No additional findings.   Subjective: Chief Complaint  Patient presents with  . Spine - Follow-up    59 year old female with history of collapsing degenerative scoliosis, being seen at [facility] for pain management. Taking nucyenta for pain  TID for pain. She is scheduled for foot surgery and is to change meds, foot specialist is performing the surgery on the foot as a second procedure for recurrent bunion right foot. She has an ASD and is on Xarelto, not a surgical candidate told at surgery, risk of surgery is greater than being on blood thinner. Pain left trochanter persists with pain with lying on the left side. And tenderness. Scoliosis radiographs today. Pain in the back persists but presently she is not ready for surgical intervention.   Review of  Systems  Constitutional: Negative.   HENT: Negative.   Eyes: Negative.   Respiratory: Negative.   Cardiovascular: Negative.   Gastrointestinal: Negative.   Endocrine: Negative.   Genitourinary: Negative.   Musculoskeletal: Negative.   Skin: Negative.   Allergic/Immunologic: Negative.   Neurological: Negative.   Hematological: Negative.   Psychiatric/Behavioral: Negative.      Objective: Vital Signs: BP 106/73 (BP Location: Left Arm, Patient Position: Sitting)   Pulse (!) 57   Ht 5\' 5"  (1.651 m)   Wt 206 lb 11.2 oz (93.8 kg)   BMI 34.40 kg/m   Physical Exam Constitutional:      Appearance: She is well-developed and well-nourished.  HENT:     Head: Normocephalic and atraumatic.  Eyes:     Extraocular Movements: EOM normal.     Pupils: Pupils are equal, round, and reactive to light.  Pulmonary:     Effort: Pulmonary effort is normal.     Breath sounds: Normal breath sounds.  Abdominal:     General: Bowel sounds are normal.     Palpations: Abdomen is soft.  Musculoskeletal:        General: Normal range of motion.     Cervical back:  Normal range of motion and neck supple.  Skin:    General: Skin is warm and dry.  Neurological:     Mental Status: She is alert and oriented to person, place, and time.  Psychiatric:        Mood and Affect: Mood and affect normal.        Behavior: Behavior normal.        Thought Content: Thought content normal.        Judgment: Judgment normal.     Back Exam   Tenderness  The patient is experiencing tenderness in the lumbar.  Muscle Strength  Right Quadriceps:  5/5  Left Quadriceps:  5/5  Right Hamstrings:  5/5  Left Hamstrings:  5/5       Specialty Comments:  No specialty comments available.  Imaging: No results found.   PMFS History: Patient Active Problem List   Diagnosis Date Noted  . 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 GERD (gastroesophageal reflux disease) 09/22/2019  . SSBE (short-segment Barrett's esophagus) 09/22/2019  . Flatulence 09/22/2019  . Migraine 11/11/2018  . Abdominal pain, epigastric 10/14/2018  . LUQ pain 10/14/2018  . Attention deficit hyperactivity disorder (ADHD) 01/12/2018  . Insomnia 01/12/2018  . Elevated liver enzymes 09/10/2017  . History of diabetes mellitus 06/06/2017  . Primary osteoarthritis of right hand 06/06/2017  . Transaminasemia   . TIA (transient ischemic attack) 05/01/2017  . Dysphasia 05/01/2017  . Chronic pain syndrome 05/01/2017  . Confusion   . Presence of right artificial knee joint 09/17/2016  . H/O total knee replacement, right 08/15/2016  . Presence of retained hardware   . Memory disorder 08/14/2016  . Cerebrovascular disease 08/14/2016  . Unilateral primary osteoarthritis, right knee 05/29/2016  . Primary osteoarthritis of both feet 05/29/2016  . Trochanteric bursitis of both hips 05/25/2016  . Neck pain 05/25/2016  . Urinary urgency 04/19/2016  . Chronic low back pain 01/11/2016  . Depression 11/29/2015  . 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 Heart  palpitations 12/14/2013  . Generalized osteoarthritis of multiple sites 11/04/2013  . Cerebral infarction ([place]) 10/30/2011  . PFO (patent foramen ovale) 10/30/2011  . Bradycardia 10/30/2011  . Chest pain 10/30/2011  . Hypothyroid   . Thrombophlebitis   . Sleep apnea   .  Fibromyalgia   . Status post bariatric surgery 12/07/2010  . Vein disorder 11/29/2010  . S/P total hysterectomy and bilateral salpingo-oophorectomy 11/29/2010  . S/P exploratory laparotomy 11/29/2010  . S/P cholecystectomy 11/29/2010  . S/P ACL surgery 11/29/2010  . Morbid obesity ([place]) 11/10/2010   Past Medical History:  Diagnosis Date  . ADD (attention deficit disorder)    takes Adderall daily  . Arthritis    "all over"  . Cerebral infarction ([place]) 10/30/2011  . Cerebrovascular disease 08/14/2016  . Chronic back pain    "all over"  . Chronic low back pain 01/11/2016  . Complication of anesthesia    tends to have hypotension when NPO and post-anesthesia  . Constipation    takes stool softener daily  . Degenerative disk disease   . Degenerative joint disease   . Depression    takes Cymbalta daily for pain per pt  . Diabetes mellitus without complication ([place])   . DVT (deep venous thrombosis) ([facility])    RLE  . Family history of adverse reaction to anesthesia    a family member woke up during surgery; "think it was my mom"  . Fibromyalgia   . Generalized osteoarthritis of multiple sites 11/04/2013  . GERD (gastroesophageal reflux disease)   . Heart palpitations 12/14/2013  . History of blood clots    superficial  . Hypoglycemia   . Hypothyroid    takes Synthroid daily  . Incomplete emptying of bladder   . Insomnia    takes Trazodone nightly  . Iron deficiency anemia    takes Ferrous Sulfate daily  . Joint pain   . Joint swelling    knees and ankles  . Memory disorder 08/14/2016  . Morbid obesity ([place])   . Nausea    takes Zofran as needed.Seeing GI doc  . Neck pain 05/25/2016  . OSA on CPAP     tested more than 5 yrs ago.    . Osteoarthritis   . PFO (patent foramen ovale)   . Primary osteoarthritis of both feet 05/29/2016   Right bunionectomy August 2017 by Dr. Sharol Given  . Scoliosis   . Sleep apnea   . Stroke [facility]) "several"   right foot weakness; memory issues, black spot right visual field since" (03/23/2015)  . Thrombophlebitis   . Trochanteric bursitis of both hips 05/25/2016  . Unilateral primary osteoarthritis, right knee 05/29/2016  . Urinary urgency 04/19/2016  . Vein disorder 11/29/2010    Family History  Problem Relation Age of Onset  . Heart disease Father   . Cancer Father   . Parkinson's disease Father   . Cancer Mother        skin cancer   . Myasthenia gravis Mother   . Heart disease Brother   . Cancer Brother   . Diabetes Brother   . Stroke Brother   . Heart disease Sister   . Heart attack Sister   . Cancer Maternal Grandfather   . Hypothyroidism Daughter   . Hypertension Other   . Colon cancer Neg Hx     Past Surgical History:  Procedure Laterality Date  . BIOPSY  11/14/2018   Procedure: BIOPSY;  Surgeon: Rogene Houston, MD;  [facility];  Service: Endoscopy;;  esophagus gastric  . BONE EXCISION Right 08/29/2017   Procedure: right trapezium excision;  Surgeon: Daryll Brod, MD;  Location: [place];  Service: Orthopedics;  Laterality: Right;  . BUNIONECTOMY Right 08/2015  . CARDIAC CATHETERIZATION     2008.  "  it was fine" (not sure why she had it done, and doesn't know where)  . CARPOMETACARPEL SUSPENSION PLASTY Right 08/29/2017   Procedure: SUSPENSION PLASTY RIGHT THUMB;  Surgeon: Daryll Brod, MD;  Location: [place];  Service: Orthopedics;  Laterality: Right;  . COLONOSCOPY N/A 03/25/2013   Procedure: COLONOSCOPY;  Surgeon: Rogene Houston, MD;  [facility];  Service: Endoscopy;  Laterality: N/A;  930  . ESOPHAGOGASTRODUODENOSCOPY    . ESOPHAGOGASTRODUODENOSCOPY (EGD) WITH PROPOFOL N/A 11/14/2018    Procedure: ESOPHAGOGASTRODUODENOSCOPY (EGD) WITH PROPOFOL;  Surgeon: Rogene Houston, MD;  [facility];  Service: Endoscopy;  Laterality: N/A;  1:55pm-office moved to 11:00am/pt notified to arrive at 9:30am per KF  . EXPLORATORY LAPAROTOMY     "took fallopian tubes out"  . JOINT REPLACEMENT     bil knee   . KNEE ARTHROPLASTY    . KNEE ARTHROSCOPY Left   . KNEE ARTHROSCOPY W/ ACL RECONSTRUCTION Right    "added pins"  . LAPAROSCOPIC CHOLECYSTECTOMY  ~ 2001  . 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 SPINAL CORD STIMULATOR INSERTION N/A 04/18/2017   Procedure: LUMBAR SPINAL CORD STIMULATOR INSERTION;  Surgeon: Clydell Hakim, MD;  Location: [place];  Service: Neurosurgery;  Laterality: N/A;  LUMBAR SPINAL CORD STIMULATOR INSERTION  . TENDON TRANSFER Right 08/29/2017   Procedure: right abductor pollicis longus transfer;  Surgeon: Daryll Brod, MD;  Location: [place];  Service: Orthopedics;  Laterality: Right;  . TOTAL KNEE ARTHROPLASTY Left 03/23/2015   Procedure: TOTAL KNEE ARTHROPLASTY;  Surgeon: Newt Minion, MD;  Location: [place];  Service: Orthopedics;  Laterality: Left;  . TOTAL KNEE ARTHROPLASTY Right 08/15/2016   Procedure: RIGHT TOTAL KNEE ARTHROPLASTY, REMOVAL ACL SCREWS;  Surgeon: Newt Minion, MD;  Location: [place];  Service: Orthopedics;  Laterality: Right;  . TOTAL KNEE ARTHROPLASTY WITH HARDWARE REMOVAL Right   . VAGINAL HYSTERECTOMY    . VARICOSE VEIN SURGERY Right X 2   Social History   Occupational History  . Occupation: Disability  . Occupation: formerly Therapist, sports, [employer]  Tobacco Use  . Smoking status: Former Smoker    Packs/day: 0.75    Years: 8.00    Pack years: 6.00    Types: Cigarettes    Quit date: 12/01/1990    Years since quitting: 29.1  . Smokeless tobacco: Never Used  . Tobacco comment: quit smoking in the 1990s  Vaping Use  . Vaping Use: Never used  Substance and Sexual Activity  . Alcohol use: No    Comment: 03/23/2015 "stopped drinking in  2012 w/gastric bypass; drank socially before bypass"  . Drug use: No  . Sexual activity: Not Currently    Birth control/protection: Surgical

## 2020-01-22 DIAGNOSIS — E063 Autoimmune thyroiditis: Secondary | ICD-10-CM | POA: Diagnosis not present

## 2020-01-22 DIAGNOSIS — E7849 Other hyperlipidemia: Secondary | ICD-10-CM | POA: Diagnosis not present

## 2020-01-22 DIAGNOSIS — M797 Fibromyalgia: Secondary | ICD-10-CM | POA: Diagnosis not present

## 2020-01-22 DIAGNOSIS — M1711 Unilateral primary osteoarthritis, right knee: Secondary | ICD-10-CM | POA: Diagnosis not present

## 2020-01-29 ENCOUNTER — Telehealth: Payer: Self-pay

## 2020-01-29 NOTE — Telephone Encounter (Signed)
DOS 02/08/2020  AIKEN OSTEOTOMY RT - 48250 [place] BUNIONECTOMY RT - 03704 DOUBLE OSTEOTOMY RT - 88891 HAMMERTOE REPAIR 2,3 RT - 69450 HALLUX IPJ FUSION RT - 38882  RECEIVED AUTH FAX FROM [facility] - AUTH # [id number] GOOD FOR CPT 28310, 91791, 50569, 79480 & 16553. GOOD FROM 02/08/2020 - 05/08/2020

## 2020-02-03 ENCOUNTER — Telehealth: Payer: Self-pay | Admitting: Emergency Medicine

## 2020-02-03 ENCOUNTER — Telehealth: Payer: Self-pay | Admitting: *Deleted

## 2020-02-03 NOTE — Telephone Encounter (Signed)
[employer] PA Key: [id number].  Stated not need PA, Medication is available without authorizaiton.    [place] regarding approval requirements.  [phone].  Informed of PA status  Deann[place]rea.  Awaiting verification from insurance company.

## 2020-02-03 NOTE — Telephone Encounter (Signed)
Patient wants to know what other medications should be discontinued before surgery on the 17th besides xarelto(stopped already).Please call.

## 2020-02-04 ENCOUNTER — Telehealth: Payer: Self-pay | Admitting: Podiatry

## 2020-02-04 ENCOUNTER — Encounter (HOSPITAL_BASED_OUTPATIENT_CLINIC_OR_DEPARTMENT_OTHER): Payer: Self-pay | Admitting: Podiatry

## 2020-02-04 ENCOUNTER — Other Ambulatory Visit: Payer: Self-pay

## 2020-02-04 ENCOUNTER — Other Ambulatory Visit (HOSPITAL_COMMUNITY)
Admission: RE | Admit: 2020-02-04 | Discharge: 2020-02-04 | Disposition: A | Discharge status: Home / Self Care | Payer: PPO | Source: Ambulatory Visit | Attending: Podiatry | Admitting: Podiatry

## 2020-02-04 DIAGNOSIS — G43909 Migraine, unspecified, not intractable, without status migrainosus: Secondary | ICD-10-CM | POA: Diagnosis not present

## 2020-02-04 DIAGNOSIS — M797 Fibromyalgia: Secondary | ICD-10-CM | POA: Diagnosis not present

## 2020-02-04 DIAGNOSIS — Z01812 Encounter for preprocedural laboratory examination: Secondary | ICD-10-CM | POA: Insufficient documentation

## 2020-02-04 DIAGNOSIS — Z20822 Contact with and (suspected) exposure to covid-19: Secondary | ICD-10-CM | POA: Diagnosis not present

## 2020-02-04 DIAGNOSIS — L989 Disorder of the skin and subcutaneous tissue, unspecified: Secondary | ICD-10-CM

## 2020-02-04 DIAGNOSIS — E119 Type 2 diabetes mellitus without complications: Secondary | ICD-10-CM | POA: Diagnosis not present

## 2020-02-04 DIAGNOSIS — M519 Unspecified thoracic, thoracolumbar and lumbosacral intervertebral disc disorder: Secondary | ICD-10-CM | POA: Diagnosis not present

## 2020-02-04 DIAGNOSIS — Z6834 Body mass index (BMI) 34.0-34.9, adult: Secondary | ICD-10-CM | POA: Diagnosis not present

## 2020-02-04 HISTORY — DX: Disorder of the skin and subcutaneous tissue, unspecified: L98.9

## 2020-02-04 LAB — SARS CORONAVIRUS 2 (TAT 6-24 HRS): SARS Coronavirus 2: NEGATIVE

## 2020-02-04 NOTE — Telephone Encounter (Signed)
Received fax dated 02/03/2019 for Debbie Pineda, patient case ID [id number], patient has been approved through 01/21/2021 for [employer].  Patient notified by MyChart.

## 2020-02-04 NOTE — Telephone Encounter (Signed)
That is fine I can do that

## 2020-02-04 NOTE — Progress Notes (Signed)
Spoke w/ via phone for pre-op interview---pt  Lab needs dos----I stat                COVID test ------02-04-2020 1310 Arrive at -------430 am 02-08-2020 NPO after MN NO Solid Food.  Clear liquids from MN until---430 am then npo Medications to take morning of surgery -----gabapentin, topamax, duloxetine, baclofen, famotidine, levothyroxine, mybetriq Diabetic medication -----n/a Patient Special Instructions -----bring cpap mask tubing and machine and leave in car Pre-Op special Istructions -----bring spinal cord stimulator remote day of surgery Patient verbalized understanding of instructions that were given at this phone interview. Patient denies shortness of breath, chest pain, fever, cough at this phone interview.  Anesthesia Review: spoke with jessica zanetto pa and patient is to call prescriber about stopping nucynta 3 days before surgery, jessica zanetto pa aware patient states she cannot stop nucynta but will notnucynta take day of surgery. Hx hx of pfo with cva due to pfo 2014, osa uses cpap, dvt on xarelto last dose 02-03-2020, spinal cord stimulator  PCP: Delman Cheadle pa H & P /medical clearance note dated 02-04-2020 on chart Cardiologist :dr Johnsie Cancel cardiac clearance note 01-11-2020 epic Chest x-ray :none EKG : 01-11-2020 epic  Bubble Echo :01-18-2020 epic Stress test:none Cardiac Cath : none Activity level: walks with walker does chores and household activities, tires due to fibromyalgia Sleep Study/ CPAP :uses nightly Fasting Blood Sugar :      / Checks Blood Sugar -- times a day:  n/a Blood Thinner/ Instructions /Last Dose:stopped xarelto per sherry at dr patel instructions on 02-03-2020 [facility] / Instructions/ Last Dose : n/a  Patient to bring spinal cord stimulator remote day of surgery

## 2020-02-04 NOTE — Telephone Encounter (Signed)
Patient stated her PCP would like for you to manage post op pain meds during recovery of surgery and its set for 02/08/2020, Please Advise

## 2020-02-05 ENCOUNTER — Encounter (HOSPITAL_COMMUNITY): Payer: Self-pay | Admitting: Anesthesiology

## 2020-02-05 ENCOUNTER — Encounter (HOSPITAL_BASED_OUTPATIENT_CLINIC_OR_DEPARTMENT_OTHER): Payer: Self-pay | Admitting: Podiatry

## 2020-02-05 NOTE — Anesthesia Preprocedure Evaluation (Deleted)
Anesthesia Evaluation    Reviewed: Allergy & Precautions, Patient's Chart, lab work & pertinent test results  Airway        Dental   Pulmonary sleep apnea and Continuous Positive Airway Pressure Ventilation , former smoker,           Cardiovascular negative cardio ROS       Neuro/Psych  Headaches, PSYCHIATRIC DISORDERS Depression TIA Neuromuscular disease CVA    GI/Hepatic Neg liver ROS, GERD  ,  Endo/Other  Hypothyroidism   Renal/GU negative Renal ROS     Musculoskeletal  (+) Arthritis , Fibromyalgia -  Abdominal   Peds  Hematology   Anesthesia Other Findings   Reproductive/Obstetrics                             Anesthesia Physical Anesthesia Plan  ASA: III  Anesthesia Plan: Regional   Post-op Pain Management:    Induction: Intravenous  PONV Risk Score and Plan: 3 and Ondansetron, Dexamethasone, Propofol infusion and Midazolam  Airway Management Planned: Natural Airway and Simple Face Mask  Additional Equipment: None  Intra-op Plan:   Post-operative Plan:   Informed Consent:   Plan Discussed with:   Anesthesia Plan Comments: (Echo:  1. Left ventricular ejection fraction, by estimation, is 70 to 75%. The  left ventricle has hyperdynamic function. The left ventricle has no  regional wall motion abnormalities. Left ventricular diastolic parameters  were normal.  2. Evidence of atrial level shunting detected by color flow Doppler.  Agitated saline contrast bubble study was positive with shunting observed  within 3-6 cardiac cycles suggestive of interatrial shunt. There is a  moderately sized patent foramen ovale.  3. Right ventricular systolic function is normal. The right ventricular  size is normal.  4. The mitral valve is normal in structure. Mild mitral valve  regurgitation.  5. The aortic valve is abnormal. Aortic valve regurgitation is not  visualized. Mild  aortic valve sclerosis is present, with no evidence of  aortic valve stenosis.  6. The inferior vena cava is normal in size with greater than 50%  respiratory variability, suggesting right atrial pressure of 3 mmHg. )        Anesthesia Quick Evaluation

## 2020-02-08 ENCOUNTER — Ambulatory Visit (HOSPITAL_BASED_OUTPATIENT_CLINIC_OR_DEPARTMENT_OTHER): Admission: RE | Admit: 2020-02-08 | Payer: PPO | Source: Home / Self Care | Admitting: Podiatry

## 2020-02-08 HISTORY — DX: Presence of spectacles and contact lenses: Z97.3

## 2020-02-08 HISTORY — DX: Other specified disorders of bone density and structure, unspecified site: M85.80

## 2020-02-08 HISTORY — DX: Prediabetes: R73.03

## 2020-02-08 SURGERY — BUNIONECTOMY, AKIN
Anesthesia: Regional | Site: Toe | Laterality: Right

## 2020-02-09 ENCOUNTER — Other Ambulatory Visit: Payer: Self-pay

## 2020-02-09 ENCOUNTER — Encounter (HOSPITAL_BASED_OUTPATIENT_CLINIC_OR_DEPARTMENT_OTHER): Payer: Self-pay | Admitting: Podiatry

## 2020-02-09 NOTE — Progress Notes (Signed)
Pt's chart and heart clearance reviewed with Dr Marcie Bal, [place] for [facility].

## 2020-02-09 NOTE — H&P (View-Only) (Signed)
Pt's chart and heart clearance reviewed with Dr Hodierne, [place] for [facility]. 

## 2020-02-09 NOTE — Telephone Encounter (Signed)
Received prescription portion of [employer] safety net foundation application for Dr Jannifer Franklin to sign due to patient requesting financial assistance with [employer]. Prescription signed by MD. Form faxed to [employer]. Received a receipt of confirmation.

## 2020-02-10 ENCOUNTER — Other Ambulatory Visit (HOSPITAL_COMMUNITY)
Admission: RE | Admit: 2020-02-10 | Discharge: 2020-02-10 | Disposition: A | Discharge status: Home / Self Care | Payer: PPO | Source: Ambulatory Visit | Attending: Podiatry | Admitting: Podiatry

## 2020-02-10 DIAGNOSIS — Z20822 Contact with and (suspected) exposure to covid-19: Secondary | ICD-10-CM | POA: Diagnosis not present

## 2020-02-10 DIAGNOSIS — Z01812 Encounter for preprocedural laboratory examination: Secondary | ICD-10-CM | POA: Diagnosis not present

## 2020-02-10 LAB — SARS CORONAVIRUS 2 (TAT 6-24 HRS): SARS Coronavirus 2: NEGATIVE

## 2020-02-10 MED ORDER — IBUPROFEN 800 MG PO TABS: 800.0000 mg | ORAL_TABLET | Freq: Four times a day (QID) | ORAL | 1 refills | Status: DC | PRN

## 2020-02-10 MED ORDER — OXYCODONE-ACETAMINOPHEN 5-325 MG PO TABS: 1.0000 | ORAL_TABLET | ORAL | 0 refills | Status: DC | PRN

## 2020-02-10 NOTE — Addendum Note (Signed)
Addended by: Boneta Lucks on: 02/10/2020 10:30 AM   Modules accepted: Orders

## 2020-02-10 NOTE — Telephone Encounter (Signed)
Cyril Mourning w/ [facility] is needing the go ahead on the Oxycodone -acetaminophen 5-325 mg since patient already has a new prescription (Nucynta-50 mg x 3 daily) from a different doctor. Please advise.  Returned call to [facility](Kristen)and gave verbal approval per Dr. Posey Pronto to proceed with the refill of the prescription-Oxycodone-5-325mg  for patient's surgery on 02/12/20.

## 2020-02-11 ENCOUNTER — Ambulatory Visit: Payer: PPO | Admitting: Specialist

## 2020-02-12 ENCOUNTER — Ambulatory Visit (HOSPITAL_BASED_OUTPATIENT_CLINIC_OR_DEPARTMENT_OTHER): Payer: PPO | Admitting: Anesthesiology

## 2020-02-12 ENCOUNTER — Other Ambulatory Visit: Payer: Self-pay

## 2020-02-12 ENCOUNTER — Ambulatory Visit (HOSPITAL_COMMUNITY): Payer: PPO

## 2020-02-12 ENCOUNTER — Ambulatory Visit (HOSPITAL_BASED_OUTPATIENT_CLINIC_OR_DEPARTMENT_OTHER)
Admission: RE | Admit: 2020-02-12 | Discharge: 2020-02-12 | Disposition: A | Discharge status: Home / Self Care | Payer: PPO | Attending: Podiatry | Admitting: Podiatry

## 2020-02-12 ENCOUNTER — Encounter (HOSPITAL_BASED_OUTPATIENT_CLINIC_OR_DEPARTMENT_OTHER): Payer: Self-pay | Admitting: Podiatry

## 2020-02-12 ENCOUNTER — Encounter (HOSPITAL_BASED_OUTPATIENT_CLINIC_OR_DEPARTMENT_OTHER)
Admission: RE | Disposition: A | Discharge status: Home / Self Care | Payer: Self-pay | Source: Home / Self Care | Attending: Podiatry

## 2020-02-12 DIAGNOSIS — E039 Hypothyroidism, unspecified: Secondary | ICD-10-CM | POA: Diagnosis not present

## 2020-02-12 DIAGNOSIS — M2041 Other hammer toe(s) (acquired), right foot: Secondary | ICD-10-CM | POA: Insufficient documentation

## 2020-02-12 DIAGNOSIS — M21611 Bunion of right foot: Secondary | ICD-10-CM | POA: Diagnosis not present

## 2020-02-12 DIAGNOSIS — Z888 Allergy status to other drugs, medicaments and biological substances status: Secondary | ICD-10-CM | POA: Diagnosis not present

## 2020-02-12 DIAGNOSIS — M2011 Hallux valgus (acquired), right foot: Secondary | ICD-10-CM | POA: Diagnosis not present

## 2020-02-12 DIAGNOSIS — E119 Type 2 diabetes mellitus without complications: Secondary | ICD-10-CM | POA: Diagnosis not present

## 2020-02-12 DIAGNOSIS — Z978 Presence of other specified devices: Secondary | ICD-10-CM

## 2020-02-12 DIAGNOSIS — S92311D Displaced fracture of first metatarsal bone, right foot, subsequent encounter for fracture with routine healing: Secondary | ICD-10-CM | POA: Diagnosis not present

## 2020-02-12 DIAGNOSIS — Z79890 Hormone replacement therapy (postmenopausal): Secondary | ICD-10-CM | POA: Diagnosis not present

## 2020-02-12 DIAGNOSIS — Z885 Allergy status to narcotic agent status: Secondary | ICD-10-CM | POA: Insufficient documentation

## 2020-02-12 DIAGNOSIS — Z882 Allergy status to sulfonamides status: Secondary | ICD-10-CM | POA: Insufficient documentation

## 2020-02-12 DIAGNOSIS — Z79899 Other long term (current) drug therapy: Secondary | ICD-10-CM | POA: Insufficient documentation

## 2020-02-12 DIAGNOSIS — M7989 Other specified soft tissue disorders: Secondary | ICD-10-CM | POA: Diagnosis not present

## 2020-02-12 DIAGNOSIS — Z9889 Other specified postprocedural states: Secondary | ICD-10-CM | POA: Diagnosis not present

## 2020-02-12 DIAGNOSIS — G8918 Other acute postprocedural pain: Secondary | ICD-10-CM | POA: Diagnosis not present

## 2020-02-12 DIAGNOSIS — Z8639 Personal history of other endocrine, nutritional and metabolic disease: Secondary | ICD-10-CM

## 2020-02-12 HISTORY — PX: HALLUX FUSION: SHX6621

## 2020-02-12 HISTORY — PX: WEIL OSTEOTOMY: SHX5044

## 2020-02-12 HISTORY — PX: HAMMER TOE SURGERY: SHX385

## 2020-02-12 SURGERY — OSTEOTOMY, WEIL
Anesthesia: General | Site: Toe | Laterality: Right

## 2020-02-12 MED ORDER — BUPIVACAINE HCL (PF) 0.25 % IJ SOLN
INTRAMUSCULAR | Status: AC
  Filled 2020-02-12: qty 30

## 2020-02-12 MED ORDER — OXYCODONE HCL 5 MG PO TABS: 5.0000 mg | ORAL_TABLET | Freq: Once | ORAL | Status: DC | PRN

## 2020-02-12 MED ORDER — MIDAZOLAM HCL 2 MG/2ML IJ SOLN
INTRAMUSCULAR | Status: AC
  Filled 2020-02-12: qty 2

## 2020-02-12 MED ORDER — LIDOCAINE 2% (20 MG/ML) 5 ML SYRINGE
INTRAMUSCULAR | Status: AC
  Filled 2020-02-12: qty 5

## 2020-02-12 MED ORDER — FENTANYL CITRATE (PF) 100 MCG/2ML IJ SOLN: 25.0000 ug | INTRAMUSCULAR | Status: DC | PRN

## 2020-02-12 MED ORDER — OXYCODONE HCL 5 MG/5ML PO SOLN: 5.0000 mg | Freq: Once | ORAL | Status: DC | PRN

## 2020-02-12 MED ORDER — CEFAZOLIN SODIUM-DEXTROSE 2-4 GM/100ML-% IV SOLN
INTRAVENOUS | Status: AC
  Filled 2020-02-12: qty 100

## 2020-02-12 MED ORDER — BUPIVACAINE-EPINEPHRINE (PF) 0.5% -1:200000 IJ SOLN
INTRAMUSCULAR | Status: DC | PRN
  Administered 2020-02-12: 25 mL
  Administered 2020-02-12: 15 mL

## 2020-02-12 MED ORDER — CEFAZOLIN SODIUM-DEXTROSE 2-4 GM/100ML-% IV SOLN
2.0000 g | Freq: Once | INTRAVENOUS | Status: AC
  Administered 2020-02-12: 2 g via INTRAVENOUS

## 2020-02-12 MED ORDER — PROMETHAZINE HCL 25 MG/ML IJ SOLN: 6.2500 mg | INTRAMUSCULAR | Status: DC | PRN

## 2020-02-12 MED ORDER — LIDOCAINE HCL (PF) 1 % IJ SOLN
INTRAMUSCULAR | Status: AC
  Filled 2020-02-12: qty 30

## 2020-02-12 MED ORDER — PROPOFOL 10 MG/ML IV BOLUS
INTRAVENOUS | Status: DC | PRN
  Administered 2020-02-12: 50 mg via INTRAVENOUS
  Administered 2020-02-12: 150 mg via INTRAVENOUS

## 2020-02-12 MED ORDER — PROPOFOL 10 MG/ML IV BOLUS
INTRAVENOUS | Status: AC
  Filled 2020-02-12: qty 20

## 2020-02-12 MED ORDER — ONDANSETRON HCL 4 MG/2ML IJ SOLN
INTRAMUSCULAR | Status: DC | PRN
  Administered 2020-02-12: 4 mg via INTRAVENOUS

## 2020-02-12 MED ORDER — LIDOCAINE 2% (20 MG/ML) 5 ML SYRINGE
INTRAMUSCULAR | Status: DC | PRN
  Administered 2020-02-12: 40 mg via INTRAVENOUS

## 2020-02-12 MED ORDER — FENTANYL CITRATE (PF) 100 MCG/2ML IJ SOLN
INTRAMUSCULAR | Status: AC
  Filled 2020-02-12: qty 2

## 2020-02-12 MED ORDER — FENTANYL CITRATE (PF) 100 MCG/2ML IJ SOLN
100.0000 ug | Freq: Once | INTRAMUSCULAR | Status: AC
  Administered 2020-02-12: 50 ug via INTRAVENOUS

## 2020-02-12 MED ORDER — LACTATED RINGERS IV SOLN: INTRAVENOUS | Status: DC

## 2020-02-12 MED ORDER — ONDANSETRON HCL 4 MG/2ML IJ SOLN
INTRAMUSCULAR | Status: AC
  Filled 2020-02-12: qty 2

## 2020-02-12 MED ORDER — FENTANYL CITRATE (PF) 100 MCG/2ML IJ SOLN
INTRAMUSCULAR | Status: DC | PRN
  Administered 2020-02-12 (×2): 25 ug via INTRAVENOUS
  Administered 2020-02-12: 50 ug via INTRAVENOUS

## 2020-02-12 MED ORDER — 0.9 % SODIUM CHLORIDE (POUR BTL) OPTIME
TOPICAL | Status: DC | PRN
  Administered 2020-02-12: 200 mL

## 2020-02-12 MED ORDER — MIDAZOLAM HCL 2 MG/2ML IJ SOLN
2.0000 mg | Freq: Once | INTRAMUSCULAR | Status: AC
  Administered 2020-02-12: 2 mg via INTRAVENOUS

## 2020-02-12 SURGICAL SUPPLY — 83 items
BANDAGE ESMARK 6X9 LF (GAUZE/BANDAGES/DRESSINGS) ×2 IMPLANT
BLADE AVERAGE 25X9 (BLADE) IMPLANT
BLADE HEX COATED 2.75 (ELECTRODE) IMPLANT
BLADE MICRO SAGITTAL (BLADE) IMPLANT
BLADE SAW LAPIPLASTY 40X11 (INSTRUMENTS) ×3 IMPLANT
BLADE SAW SGTL 14.8X61X.97 HD (BLADE) IMPLANT
BLADE SURG 15 STRL LF DISP TIS (BLADE) ×10 IMPLANT
BLADE SURG 15 STRL SS (BLADE) ×15
BNDG ELASTIC 3X5.8 VLCR STR LF (GAUZE/BANDAGES/DRESSINGS) ×3 IMPLANT
BNDG ELASTIC 4X5.8 VLCR STR LF (GAUZE/BANDAGES/DRESSINGS) IMPLANT
BNDG ELASTIC 6X5.8 VLCR STR LF (GAUZE/BANDAGES/DRESSINGS) IMPLANT
BNDG ESMARK 6X9 LF (GAUZE/BANDAGES/DRESSINGS) ×3
BNDG GAUZE ELAST 4 BULKY (GAUZE/BANDAGES/DRESSINGS) ×3 IMPLANT
BUR OVAL CARBIDE 4.0 (BURR) IMPLANT
CAP PIN PROTECTOR ORTHO WHT (CAP) ×3 IMPLANT
CHLORAPREP W/TINT 26 (MISCELLANEOUS) ×3 IMPLANT
COVER BACK TABLE 60X90IN (DRAPES) ×3 IMPLANT
COVER MAYO STAND STRL (DRAPES) IMPLANT
COVER WAND RF STERILE (DRAPES) IMPLANT
CUFF TOURN SGL QUICK 24 (TOURNIQUET CUFF) ×3
CUFF TOURN SGL QUICK 34 (TOURNIQUET CUFF)
CUFF TRNQT CYL 24X4X16.5-23 (TOURNIQUET CUFF) ×2 IMPLANT
CUFF TRNQT CYL 34X4.125X (TOURNIQUET CUFF) IMPLANT
DRAPE C-ARM 42X72 X-RAY (DRAPES) IMPLANT
DRAPE C-ARMOR (DRAPES) IMPLANT
DRAPE EXTREMITY T 121X128X90 (DISPOSABLE) ×3 IMPLANT
DRAPE IMP U-DRAPE 54X76 (DRAPES) ×3 IMPLANT
DRAPE OEC MINIVIEW 54X84 (DRAPES) ×3 IMPLANT
DRAPE U-SHAPE 47X51 STRL (DRAPES) ×3 IMPLANT
DRSG ADAPTIC 3X8 NADH LF (GAUZE/BANDAGES/DRESSINGS) IMPLANT
DRSG EMULSION OIL 3X3 NADH (GAUZE/BANDAGES/DRESSINGS) IMPLANT
DRSG PAD ABDOMINAL 8X10 ST (GAUZE/BANDAGES/DRESSINGS) IMPLANT
ELECT REM PT RETURN 9FT ADLT (ELECTROSURGICAL) ×3
ELECTRODE REM PT RTRN 9FT ADLT (ELECTROSURGICAL) ×2 IMPLANT
GAUZE 4X4 16PLY RFD (DISPOSABLE) IMPLANT
GAUZE SPONGE 4X4 12PLY STRL (GAUZE/BANDAGES/DRESSINGS) ×3 IMPLANT
GAUZE XEROFORM 1X8 LF (GAUZE/BANDAGES/DRESSINGS) ×6 IMPLANT
GLOVE SURG ENC MOIS LTX SZ7 (GLOVE) ×3 IMPLANT
GLOVE SURG SS PI 8.0 STRL IVOR (GLOVE) ×3 IMPLANT
GLOVE SURG UNDER POLY LF SZ7 (GLOVE) ×3 IMPLANT
GLOVE SURG UNDER POLY LF SZ7.5 (GLOVE) ×9 IMPLANT
GOWN STRL REUS W/ TWL LRG LVL3 (GOWN DISPOSABLE) ×2 IMPLANT
GOWN STRL REUS W/TWL 2XL LVL3 (GOWN DISPOSABLE) ×3 IMPLANT
GOWN STRL REUS W/TWL LRG LVL3 (GOWN DISPOSABLE) ×3
GOWN STRL REUS W/TWL XL LVL3 (GOWN DISPOSABLE) ×3 IMPLANT
LAPIPLASTY SYSTEM 2 (Screw) ×3 IMPLANT
NEEDLE HYPO 25X1 1.5 SAFETY (NEEDLE) IMPLANT
NS IRRIG 1000ML POUR BTL (IV SOLUTION) ×3 IMPLANT
PACK BASIN DAY SURGERY FS (CUSTOM PROCEDURE TRAY) ×3 IMPLANT
PAD CAST 4YDX4 CTTN HI CHSV (CAST SUPPLIES) ×2 IMPLANT
PADDING CAST ABS 4INX4YD NS (CAST SUPPLIES) ×1
PADDING CAST ABS COTTON 4X4 ST (CAST SUPPLIES) ×2 IMPLANT
PADDING CAST COTTON 4X4 STRL (CAST SUPPLIES) ×3
PENCIL SMOKE EVACUATOR (MISCELLANEOUS) ×3 IMPLANT
SCREW HIGH PITCH LOCK 2.7 (Screw) ×3 IMPLANT
SHEET MEDIUM DRAPE 40X70 STRL (DRAPES) ×3 IMPLANT
SLEEVE SCD COMPRESS KNEE MED (MISCELLANEOUS) ×3 IMPLANT
SPLINT FIBERGLASS 4X30 (CAST SUPPLIES) IMPLANT
SPONGE LAP 18X18 RF (DISPOSABLE) ×3 IMPLANT
SPONGE LAP 4X18 RFD (DISPOSABLE) ×3 IMPLANT
STAPLER VISISTAT 35W (STAPLE) IMPLANT
STOCKINETTE 6  STRL (DRAPES) ×3
STOCKINETTE 6 STRL (DRAPES) ×2 IMPLANT
STOCKINETTE SYNTHETIC 4 NONSTR (MISCELLANEOUS) IMPLANT
SUCTION FRAZIER HANDLE 10FR (MISCELLANEOUS) ×3
SUCTION TUBE FRAZIER 10FR DISP (MISCELLANEOUS) ×2 IMPLANT
SUT MERSILENE 4 0 P 3 (SUTURE) IMPLANT
SUT MNCRL AB 3-0 PS2 18 (SUTURE) ×6 IMPLANT
SUT MNCRL AB 4-0 PS2 18 (SUTURE) ×6 IMPLANT
SUT MON AB 5-0 PS2 18 (SUTURE) ×6 IMPLANT
SUT PROLENE 3 0 PS 2 (SUTURE) IMPLANT
SUT PROLENE 4 0 PS 2 18 (SUTURE) ×3 IMPLANT
SUT VIC AB 2-0 SH 27 (SUTURE)
SUT VIC AB 2-0 SH 27XBRD (SUTURE) IMPLANT
SUT VIC AB 3-0 PS2 18 (SUTURE) IMPLANT
SUT VICRYL 4-0 PS2 18IN ABS (SUTURE) IMPLANT
SYR BULB EAR ULCER 3OZ GRN STR (SYRINGE) ×3 IMPLANT
SYR CONTROL 10ML LL (SYRINGE) IMPLANT
SYSTEM LAPIPLASTY 2 (Screw) ×2 IMPLANT
TOWEL GREEN STERILE FF (TOWEL DISPOSABLE) ×3 IMPLANT
TUBE CONNECTING 20X1/4 (TUBING) ×3 IMPLANT
UNDERPAD 30X36 HEAVY ABSORB (UNDERPADS AND DIAPERS) ×3 IMPLANT
YANKAUER SUCT BULB TIP NO VENT (SUCTIONS) ×3 IMPLANT

## 2020-02-12 NOTE — Transfer of Care (Signed)
Immediate Anesthesia Transfer of Care Note  Patient: Debbie Pineda  Procedure(s) Performed: DOUBLE L OSTEOTOMY (Right Foot) SECOND AND THIRD HAMMER TOE CORRECTION; CAPSULOTOMY SECOND INTERPHALANGEAL JOINT (Right Toe) HALLUX INTERPHANGEAL JOINT  FUSION (Right Foot)  Patient Location: PACU  Anesthesia Type:General  Level of Consciousness: awake and alert   Airway & Oxygen Therapy: Patient Spontanous Breathing and Patient connected to face mask oxygen  Post-op Assessment: Report given to RN and Post -op Vital signs reviewed and stable  Post vital signs: Reviewed and stable  Last Vitals:  Vitals Value Taken Time  BP 128/71 02/12/20 1439  Temp    Pulse 77 02/12/20 1441  Resp 15 02/12/20 1441  SpO2 100 % 02/12/20 1441  Vitals shown include unvalidated device data.  Last Pain:  Vitals:   02/12/20 1145  TempSrc:   PainSc: 0-No pain         Complications: No complications documented.

## 2020-02-12 NOTE — Anesthesia Preprocedure Evaluation (Addendum)
Anesthesia Evaluation  Patient identified by MRN, date of birth, ID band Patient awake    Reviewed: Allergy & Precautions, NPO status , Patient's Chart, lab work & pertinent test results  History of Anesthesia Complications Negative for: history of anesthetic complications  Airway Mallampati: II  TM Distance: >3 FB Neck ROM: Full    Dental  (+) Dental Advisory Given   Pulmonary sleep apnea and Continuous Positive Airway Pressure Ventilation , former smoker,    Pulmonary exam normal        Cardiovascular Normal cardiovascular exam   '21 TTE - EF 70 to 75%. Evidence of atrial level shunting detected by color flow Doppler. Agitated saline contrast bubble study was positive with shunting observed  within 3-6 cardiac cycles suggestive of interatrial shunt. There is a moderately sized patent foramen ovale. Mild MR. Mild aortic valve sclerosis is present, with no evidence of aortic valve stenosis.     Neuro/Psych  Headaches, PSYCHIATRIC DISORDERS Depression  Spinal cord stimulator  TIACVA    GI/Hepatic Neg liver ROS, GERD  Medicated and Controlled, S/p roux-en-y bypass    Endo/Other  Hypothyroidism  Obesity Pre-DM   Renal/GU negative Renal ROS     Musculoskeletal  (+) Arthritis , Osteoarthritis,  Fibromyalgia -  Abdominal   Peds  Hematology  On xarelto    Anesthesia Other Findings Covid test negative Methylenetetrahydrofolate reductase (MTHFR) deficiency   Reproductive/Obstetrics                            Anesthesia Physical Anesthesia Plan  ASA: III  Anesthesia Plan: General   Post-op Pain Management:  Regional for Post-op pain   Induction: Intravenous  PONV Risk Score and Plan: 3 and Treatment may vary due to age or medical condition, Ondansetron, Dexamethasone and Midazolam  Airway Management Planned: LMA  Additional Equipment: None  Intra-op Plan:   Post-operative Plan:  Extubation in OR  Informed Consent: I have reviewed the patients History and Physical, chart, labs and discussed the procedure including the risks, benefits and alternatives for the proposed anesthesia with the patient or authorized representative who has indicated his/her understanding and acceptance.     Dental advisory given  Plan Discussed with: CRNA and Anesthesiologist  Anesthesia Plan Comments:        Anesthesia Quick Evaluation

## 2020-02-12 NOTE — Anesthesia Procedure Notes (Signed)
Procedure Name: LMA Insertion Date/Time: 02/12/2020 12:08 PM Performed by: Eulas Post, Roman Sandall W, CRNA Pre-anesthesia Checklist: Patient identified, Emergency Drugs available, Suction available and Patient being monitored Patient Re-evaluated:Patient Re-evaluated prior to induction Oxygen Delivery Method: Circle system utilized Preoxygenation: Pre-oxygenation with 100% oxygen Induction Type: IV induction Ventilation: Mask ventilation without difficulty LMA: LMA inserted LMA Size: 4.0 Number of attempts: 1 Placement Confirmation: positive ETCO2 and breath sounds checked- equal and bilateral Tube secured with: Tape Dental Injury: Teeth and Oropharynx as per pre-operative assessment

## 2020-02-12 NOTE — Anesthesia Procedure Notes (Signed)
Anesthesia Regional Block: Popliteal block   Pre-Anesthetic Checklist: ,, timeout performed, Correct Patient, Correct Site, Correct Laterality, Correct Procedure, Correct Position, site marked, Risks and benefits discussed,  Surgical consent,  Pre-op evaluation,  At surgeon's request and post-op pain management  Laterality: Right  Prep: chloraprep       Needles:  Injection technique: Single-shot  Needle Type: Echogenic Needle     Needle Length: 10cm  Needle Gauge: 21     Additional Needles:   Narrative:  Start time: 02/12/2020 11:36 AM End time: 02/12/2020 11:40 AM Injection made incrementally with aspirations every 5 mL.  Performed by: Personally  Anesthesiologist: Audry Pili, MD  Additional Notes: No pain on injection. No increased resistance to injection. Injection made in 5cc increments. Good needle visualization. Patient tolerated the procedure well.

## 2020-02-12 NOTE — Discharge Instructions (Signed)
After Surgery Instructions   1) If you are recuperating from surgery anywhere other than home, please be sure to leave us the number where you can be reached.  2) Go directly home and rest.  3) Keep the operated foot(feet) elevated six inches above the hip when sitting or lying down. This will help control swelling and pain.  4) Support the elevated foot and leg with pillows. DO NOT PLACE PILLOWS UNDER THE KNEE.  5) DO NOT REMOVE or get your bandages WET, unless you were given different instructions by your doctor to do so. This increases the risk of infection.  6) Wear your surgical shoe or surgical boot at all times when you are up on your feet.  7) A limited amount of pain and swelling may occur. The skin may take on a bruised appearance. DO NOT BE ALARMED, THIS IS NORMAL.  8) For slight pain and swelling, apply an ice pack directly over the bandages for 15 minutes only out of each hour of the day. Continue until seen in the office for your first post op visit. DON NOT     APPLY ANY FORM OF HEAT TO THE AREA.  9) Have prescriptions filled immediately and take as directed.  10) Drink lots of liquids, water and juice to stay hydrated.  11) CALL IMMEDIATELY IF:  *Bleeding continues until the following day of surgery  *Pain increases and/or does not respond to medication  *Bandages or cast appears to tight  *If your bandage gets wet  *Trip, fall or stump your surgical foot  *If your temperature goes above 101  *If you have ANY questions at all  YOU NOW CONTROL THE EFFORT OF YOUR RECOVERY. ADHERING TO THESE INSTRUCTIONS WILL OFFER YOU THE MOST COMPLETE RESULTS   Regional Anesthesia Blocks  1. Numbness or the inability to move the "blocked" extremity may last from 3-48 hours after placement. The length of time depends on the medication injected and your individual response to the medication. If the numbness is not going away after 48 hours, call your surgeon.  2. The extremity that  is blocked will need to be protected until the numbness is gone and the  Strength has returned. Because you cannot feel it, you will need to take extra care to avoid injury. Because it may be weak, you may have difficulty moving it or using it. You may not know what position it is in without looking at it while the block is in effect.  3. For blocks in the legs and feet, returning to weight bearing and walking needs to be done carefully. You will need to wait until the numbness is entirely gone and the strength has returned. You should be able to move your leg and foot normally before you try and bear weight or walk. You will need someone to be with you when you first try to ensure you do not fall and possibly risk injury.  4. Bruising and tenderness at the needle site are common side effects and will resolve in a few days.  5. Persistent numbness or new problems with movement should be communicated to the surgeon or the [facility] ([phone])/ [facility] ([phone]).   Post Anesthesia Home Care Instructions  Activity: Get plenty of rest for the remainder of the day. A responsible individual must stay with you for 24 hours following the procedure.  For the next 24 hours, DO NOT: -Drive a car -Operate machinery -Drink alcoholic beverages -Take   any medication unless instructed by your physician -Make any legal decisions or sign important papers.  Meals: Start with liquid foods such as gelatin or soup. Progress to regular foods as tolerated. Avoid greasy, spicy, heavy foods. If nausea and/or vomiting occur, drink only clear liquids until the nausea and/or vomiting subsides. Call your physician if vomiting continues.  Special Instructions/Symptoms: Your throat may feel dry or sore from the anesthesia or the breathing tube placed in your throat during surgery. If this causes discomfort, gargle with warm salt water. The discomfort should disappear within 24  hours.  If you had a scopolamine patch placed behind your ear for the management of post- operative nausea and/or vomiting:  1. The medication in the patch is effective for 72 hours, after which it should be removed.  Wrap patch in a tissue and discard in the trash. Wash hands thoroughly with soap and water. 2. You may remove the patch earlier than 72 hours if you experience unpleasant side effects which may include dry mouth, dizziness or visual disturbances. 3. Avoid touching the patch. Wash your hands with soap and water after contact with the patch. 

## 2020-02-12 NOTE — Anesthesia Postprocedure Evaluation (Signed)
Anesthesia Post Note  Patient: Debbie Pineda  Procedure(s) Performed: DOUBLE L OSTEOTOMY (Right Foot) SECOND AND THIRD HAMMER TOE CORRECTION; CAPSULOTOMY SECOND INTERPHALANGEAL JOINT (Right Toe) HALLUX INTERPHANGEAL JOINT  FUSION (Right Foot)     Patient location during evaluation: PACU Anesthesia Type: General Level of consciousness: awake and alert Pain management: pain level controlled Vital Signs Assessment: post-procedure vital signs reviewed and stable Respiratory status: spontaneous breathing, nonlabored ventilation and respiratory function stable Cardiovascular status: blood pressure returned to baseline and stable Postop Assessment: no apparent nausea or vomiting Anesthetic complications: no   No complications documented.  Last Vitals:  Vitals:   02/12/20 1445 02/12/20 1500  BP: 124/78 128/72  Pulse: 79 78  Resp: 16 15  Temp:    SpO2: 100% 96%    Last Pain:  Vitals:   02/12/20 1500  TempSrc:   PainSc: 0-No pain                 Audry Pili

## 2020-02-12 NOTE — Progress Notes (Signed)
Assisted Dr. Fransisco Beau with right, ultrasound guided, popliteal/saphenous, block. Side rails up, monitors on throughout procedure. See vital signs in flow sheet. Tolerated Procedure well.

## 2020-02-12 NOTE — Interval H&P Note (Signed)
History and Physical Interval Note:  02/12/2020 11:42 AM  [name]  has presented today for surgery, with the diagnosis of HAMMERTOE,BUNION,RIGHT FOOT,.  The various methods of treatment have been discussed with the patient and family. After consideration of risks, benefits and other options for treatment, the patient has consented to  Procedure(s) with comments: AIKEN OSTEOTOMY (Right) - IV SEDATION AUSTIN BUNIONECTOMY (Right) DOUBLE L OSTEOTOMY (Right) HAMMER TOE CORRECTION (Right) HALLUX INTERPHANGEAL JOINT  FUSION (Right)  and hammertoe correction of second and third digit/metatarsal as a surgical intervention.  The patient's history has been reviewed, patient examined, no change in status, stable for surgery.  I have reviewed the patient's chart and labs.  Questions were answered to the patient's satisfaction.     Debbie Pineda

## 2020-02-12 NOTE — Anesthesia Procedure Notes (Signed)
Anesthesia Regional Block: Adductor canal block   Pre-Anesthetic Checklist: ,, timeout performed, Correct Patient, Correct Site, Correct Laterality, Correct Procedure, Correct Position, site marked, Risks and benefits discussed,  Surgical consent,  Pre-op evaluation,  At surgeon's request and post-op pain management  Laterality: Right  Prep: chloraprep       Needles:  Injection technique: Single-shot  Needle Type: Echogenic Needle     Needle Length: 10cm  Needle Gauge: 21     Additional Needles:   Narrative:  Start time: 02/12/2020 11:32 AM End time: 02/12/2020 11:35 AM Injection made incrementally with aspirations every 5 mL.  Performed by: Personally  Anesthesiologist: Audry Pili, MD  Additional Notes: No pain on injection. No increased resistance to injection. Injection made in 5cc increments. Good needle visualization. Patient tolerated the procedure well.

## 2020-02-15 ENCOUNTER — Encounter (HOSPITAL_BASED_OUTPATIENT_CLINIC_OR_DEPARTMENT_OTHER): Payer: Self-pay | Admitting: Podiatry

## 2020-02-15 ENCOUNTER — Other Ambulatory Visit (INDEPENDENT_AMBULATORY_CARE_PROVIDER_SITE_OTHER): Payer: Self-pay | Admitting: *Deleted

## 2020-02-15 DIAGNOSIS — K219 Gastro-esophageal reflux disease without esophagitis: Secondary | ICD-10-CM

## 2020-02-15 DIAGNOSIS — K2270 Barrett's esophagus without dysplasia: Secondary | ICD-10-CM

## 2020-02-15 MED ORDER — FAMOTIDINE 40 MG PO TABS: 20.0000 mg | ORAL_TABLET | Freq: Every day | ORAL | 3 refills | Status: DC

## 2020-02-15 NOTE — Addendum Note (Signed)
Addendum  created 02/15/20 [id number] by Xaviar Lunn, Ernesta Amble, CRNA   Charge Capture section accepted

## 2020-02-16 ENCOUNTER — Telehealth: Payer: PPO | Admitting: Podiatry

## 2020-02-16 NOTE — Op Note (Signed)
Surgeon: Surgeon(s): Felipa Furnace, DPM  Assistants: None Pre-operative diagnosis: HAMMERTOE,BUNION,RIGHT FOOT,  Post-operative diagnosis: same Procedure: 1.  Right first tarsometatarsal joint fusion #2 right second and third metatarsophalangeal joint capsulotomy #3 right second and third digit hammertoe Pathology: * No specimens in log *  Pertinent Intra-op findings: Severe bunion deformity noted and second and third digit hammertoe contracture noted with metatarsophalangeal joint of each digit respectively joint contracture. Anesthesia: General  Hemostasis:  Total Tourniquet Time Documented: Calf (Right) - 120 minutes Total: Calf (Right) - 120 minutes  EBL: 10 mL  Materials: Treace medical lapiplasty.  K wires x2, 3040 5-0 Monocryl Injectables: None Complications: None  Indications for surgery: A 60 y.o. female presents with right painful severe bunion deformity after undergoing a previous cheilectomy/silver procedure with painful hammertoe of the second third digit.. Patient has failed all conservative therapy including but not limited to shoe gear modification, orthotics. She wishes to have surgical correction of the foot/deformity. It was determined that patient would benefit from right Lapidus procedure/first tarsometatarsal joint fusion with hammertoe PIPJ arthroplasty of second and third digit with metatarsophalangeal joint of second and third capsulotomy. Informed surgical risk consent was reviewed and read aloud to the patient.  I reviewed the films.  I have discussed my findings with the patient in great detail.  I have discussed all risks including but not limited to infection, stiffness, scarring, limp, disability, deformity, damage to blood vessels and nerves, numbness, poor healing, need for braces, arthritis, chronic pain, amputation, death.  All benefits and realistic expectations discussed in great detail.  I have made no promises as to the outcome.  I have provided realistic  expectations.  I have offered the patient a 2nd opinion, which they have declined and assured me they preferred to proceed despite the risks   Procedure in detail: The patient was both verbally and visually identified by myself, the nursing staff, and anesthesia staff in the preoperative holding area. They were then transferred to the operating room and placed on the operative table in supine position.  Tourniquet was inflated to 250 mmHg pressure  A skin marker was used to delineate an incision along the dorsomedial aspect of the 1st metatarsocuneiformjoint. A 15 blade was used to create the incision through the skin, followed by dissection with Metzenbaum through the subcutaneous layer. Care was taken to protect and retract all vital soft tissue and neurovascular structures. The Bovie was used as needed for hemostasis. A 15 blade was then used to incise through the deep fascial layer down to bone along the length of the entire incision. This layer was then reflected to expose the joint. Care was taken to maintain the cuneiform insertion of the tibialis anterior tendon. The dorsal and plantar capsular ligaments were sharply release.  The joint was exposed.  At this point the joint was planed using a sagittal saw per technique guide of Treace medical Lapidus plasty.  At this point the technique was carried out for the technique guide without complication.  Fluoroscopy was used to check correction and multiple points.  The joint was prepped with sagittal saw as well as K wire for subchondral drilling.  Once the IM was reduced, the fixation was applied with 2 Treace medical plates with 3.5 millimeter screws x8.  At this time the reduction was visualized under fluoroscopy and good correction alignment noted with return of the sesamoids underneath the first metatarsal.  Attention was directed to the second and third digit hammertoe.  A longitudinal incision  was made through the epidermis dermis tissue including  past the metatarsophalangeal joint of each digit.  A transverse incision was created at the proximal interphalangeal joint with tenotomy of the extensor tendon to reveal the PIPJ joint.  A sagittal saw was used to create an arthroplasty of the head of the proximal phalanx.  At this time attention was directed to the metatarsophalangeal joint.  The joint was released using McClamary elevator and perform capsulotomy in standard technique.  This was carried out for both the second and third metatarsophalangeal joint.  At this time good correction alignment noted.  The alignment was held in place with a K wire with retrograde insertion from distal tip of the hallux into the metatarsophalangeal joint into the shaft.  Good correction alignment noted at this time.  The wire was band and cut with a wire cutter.  Toe caps were inserted into the exposed wire.  At this time good correction alignment noted of the entire forefoot.  The foot was dressed in standard technique.  The tourniquet was deflated.  Cap refill was noted to all digits.  All bony prominences were well padded.   At the conclusion of the procedure the patient was awoken from anesthesia and found to have tolerated the procedure well any complications. There were transferred to PACU with vital signs stable and vascular status intact.  Boneta Lucks, DPM

## 2020-02-16 NOTE — Telephone Encounter (Signed)
Patient called inquiring about POV scheduled for 02/19/20, patient stated she tested positive on 02/12/20 after surgery. Patient needs to cancel the upcoming appointment and would like to know if its ok to just come in for POV on 03/04/20, Patient would like for you to inform surgery center if possible to let them know she was positive during procedure, Please Advise

## 2020-02-16 NOTE — Telephone Encounter (Signed)
Yeah that is fine. I already let them know

## 2020-02-16 NOTE — Telephone Encounter (Signed)
[place] calling Ms. Irlanda now

## 2020-02-17 ENCOUNTER — Encounter: Payer: PPO | Admitting: Podiatry

## 2020-02-17 ENCOUNTER — Encounter: Payer: Self-pay | Admitting: Specialist

## 2020-02-19 ENCOUNTER — Encounter: Payer: PPO | Admitting: Podiatry

## 2020-02-20 DIAGNOSIS — E7849 Other hyperlipidemia: Secondary | ICD-10-CM | POA: Diagnosis not present

## 2020-02-20 DIAGNOSIS — M797 Fibromyalgia: Secondary | ICD-10-CM | POA: Diagnosis not present

## 2020-02-20 DIAGNOSIS — M1711 Unilateral primary osteoarthritis, right knee: Secondary | ICD-10-CM | POA: Diagnosis not present

## 2020-02-20 DIAGNOSIS — E063 Autoimmune thyroiditis: Secondary | ICD-10-CM | POA: Diagnosis not present

## 2020-02-22 ENCOUNTER — Other Ambulatory Visit: Payer: Self-pay | Admitting: Podiatry

## 2020-02-22 MED ORDER — DOXYCYCLINE HYCLATE 100 MG PO TABS
100.0000 mg | ORAL_TABLET | Freq: Two times a day (BID) | ORAL | 0 refills | Status: DC
Start: 2020-02-22 — End: 2020-05-16

## 2020-02-24 ENCOUNTER — Other Ambulatory Visit: Payer: Self-pay

## 2020-02-24 ENCOUNTER — Encounter: Payer: Self-pay | Admitting: Podiatry

## 2020-02-24 ENCOUNTER — Ambulatory Visit (INDEPENDENT_AMBULATORY_CARE_PROVIDER_SITE_OTHER): Payer: PPO

## 2020-02-24 ENCOUNTER — Ambulatory Visit (INDEPENDENT_AMBULATORY_CARE_PROVIDER_SITE_OTHER): Payer: PPO | Admitting: Podiatry

## 2020-02-24 DIAGNOSIS — M2031 Hallux varus (acquired), right foot: Secondary | ICD-10-CM

## 2020-02-24 DIAGNOSIS — M2011 Hallux valgus (acquired), right foot: Secondary | ICD-10-CM

## 2020-02-24 DIAGNOSIS — Z9889 Other specified postprocedural states: Secondary | ICD-10-CM

## 2020-02-24 MED ORDER — OXYCODONE-ACETAMINOPHEN 10-325 MG PO TABS: 1.0000 | ORAL_TABLET | ORAL | 0 refills | Status: DC | PRN

## 2020-02-25 DIAGNOSIS — Z0000 Encounter for general adult medical examination without abnormal findings: Secondary | ICD-10-CM | POA: Diagnosis not present

## 2020-02-25 DIAGNOSIS — Z0001 Encounter for general adult medical examination with abnormal findings: Secondary | ICD-10-CM | POA: Diagnosis not present

## 2020-02-25 DIAGNOSIS — I10 Essential (primary) hypertension: Secondary | ICD-10-CM | POA: Diagnosis not present

## 2020-02-25 DIAGNOSIS — Z1331 Encounter for screening for depression: Secondary | ICD-10-CM | POA: Diagnosis not present

## 2020-02-25 DIAGNOSIS — F325 Major depressive disorder, single episode, in full remission: Secondary | ICD-10-CM | POA: Diagnosis not present

## 2020-02-25 DIAGNOSIS — E7849 Other hyperlipidemia: Secondary | ICD-10-CM | POA: Diagnosis not present

## 2020-02-25 DIAGNOSIS — Z6834 Body mass index (BMI) 34.0-34.9, adult: Secondary | ICD-10-CM | POA: Diagnosis not present

## 2020-02-25 DIAGNOSIS — M79671 Pain in right foot: Secondary | ICD-10-CM | POA: Diagnosis not present

## 2020-02-28 ENCOUNTER — Encounter (INDEPENDENT_AMBULATORY_CARE_PROVIDER_SITE_OTHER): Payer: Self-pay

## 2020-03-01 ENCOUNTER — Encounter: Payer: Self-pay | Admitting: Podiatry

## 2020-03-01 NOTE — Progress Notes (Signed)
Subjective:  Patient ID: Debbie Pineda, female    DOB: January 07, 1961,  MRN: [id number]  Chief Complaint  Patient presents with  . Routine Post Op    DOS 02/12/20 Procedure: 1.  Right first tarsometatarsal joint fusion #2 right second and third metatarsophalangeal joint capsulotomy #3 right second and third digit hammertoe   "Its been sore for sure"     60 y.o. female returns for post-op check.  Patient states she is doing well.  She still has some pain.  She was unable to see me sooner because of Covid positive.  She was isolating herself.  She denies any other acute complaints.  She would like to discuss what the next plans will be.  She has been nonweightbearing to the right foot.  Review of Systems: Negative except as noted in the HPI. Denies N/V/F/Ch.  Past Medical History:  Diagnosis Date  . ADD (attention deficit disorder)   . Arthritis    "all over"  . Cerebral infarction ([place]) 10/30/2011  . Cerebrovascular disease 08/14/2016  . Chronic back pain    "all over"  . Chronic low back pain 01/11/2016  . Complication of anesthesia    tends to have hypotension when NPO and post-anesthesia  . Constipation    takes stool softener daily  . Degenerative disk disease   . Degenerative joint disease   . DVT (deep venous thrombosis) ([place]) 2014   RLE  . Family history of adverse reaction to anesthesia    a family member woke up during surgery; "think it was my mom"  . Fibromyalgia   . Generalized osteoarthritis of multiple sites 11/04/2013  . GERD (gastroesophageal reflux disease)   . Heart palpitations 12/14/2013   resolved  . History of blood clots    superficial  . Hypoglycemia   . Hypothyroid    takes Synthroid daily  . Incomplete emptying of bladder   . Insomnia    takes Trazodone nightly  . Iron deficiency anemia    takes Ferrous Sulfate daily  . Joint pain   . Joint swelling    knees and ankles  . Memory disorder 08/14/2016  . Morbid obesity ([place])   . Nausea     takes Zofran as needed.Seeing GI doc  . Neck pain 05/25/2016  . OSA on CPAP    tested more than 5 yrs ago.    . Osteoarthritis   . Osteopenia    in feet  . PFO (patent foramen ovale)    no murmur  . Pre-diabetes   . Primary osteoarthritis of both feet 05/29/2016   Right bunionectomy August 2017 by Dr. Sharol Given  . Scoliosis   . Skin abnormality 02/04/2020   raised area on lip  . Sleep apnea    mild osa per pt  . Stroke [facility]) "several"last 2014   right foot weakness; memory issues, black spot right visual field since" (03/23/2015)  . Thrombophlebitis   . Trochanteric bursitis of both hips 05/25/2016  . Unilateral primary osteoarthritis, right knee 05/29/2016  . Urinary urgency 04/19/2016  . Vein disorder 11/29/2010   varicose veins both legs  . Wears glasses     Current Outpatient Medications:  .  oxyCODONE-acetaminophen (PERCOCET) 10-325 MG tablet, Take 1 tablet by mouth every 4 (four) hours as needed for pain., Disp: 30 tablet, Rfl: 0 .  acetaminophen (TYLENOL) 500 MG tablet, Take 1,000 mg by mouth every 6 (six) hours as needed., Disp: , Rfl:  .  amoxicillin (AMOXIL) 500 MG capsule, Take 2,000  mg by mouth See admin instructions. Take 2000 mg by mouth 1 hour prior to dental appointment, Disp: , Rfl:  .  baclofen (LIORESAL) 10 MG tablet, Take 10 mg by mouth 3 (three) times daily. , Disp: , Rfl: 1 .  bisacodyl (DULCOLAX) 5 MG EC tablet, Take 5 mg by mouth daily as needed for moderate constipation., Disp: , Rfl:  .  Cyanocobalamin (VITAMIN B-12) 5000 MCG SUBL, Place 5,000 mcg under the tongue daily. , Disp: , Rfl:  .  diclofenac sodium (VOLTAREN) 1 % GEL, Apply 4 g topically 4 (four) times daily as needed (PAIN)., Disp: 5 Tube, Rfl: 1 .  docusate sodium (COLACE) 100 MG capsule, Take 100 mg by mouth as needed for mild constipation., Disp: , Rfl:  .  donepezil (ARICEPT) 10 MG tablet, Take 1 tablet (10 mg total) by mouth at bedtime., Disp: 90 tablet, Rfl: 3 .  doxycycline (VIBRA-TABS) 100 MG  tablet, Take 1 tablet (100 mg total) by mouth 2 (two) times daily., Disp: 20 tablet, Rfl: 0 .  DULoxetine (CYMBALTA) 60 MG capsule, Take 60 mg by mouth 2 (two) times daily., Disp: , Rfl: 11 .  Erenumab-aooe (AIMOVIG) 70 MG/ML SOAJ, Inject 70 mg into the skin every 14 (fourteen) days., Disp: 2.24 mL, Rfl: 5 .  famotidine (PEPCID) 40 MG tablet, Take 0.5 tablets (20 mg total) by mouth daily., Disp: 90 tablet, Rfl: 3 .  fluconazole (DIFLUCAN) 150 MG tablet, Take 150 mg by mouth daily as needed (yeast infection). Finishes Sunday 02-07-2020 for yeast infection, Disp: , Rfl:  .  folic acid (FOLVITE) 268 MCG tablet, Take 400 mcg by mouth daily., Disp: , Rfl:  .  gabapentin (NEURONTIN) 800 MG tablet, Take 1 tablet (800 mg total) by mouth 3 (three) times daily., Disp: , Rfl:  .  ibuprofen (ADVIL) 800 MG tablet, Take 1 tablet (800 mg total) by mouth every 6 (six) hours as needed., Disp: 60 tablet, Rfl: 1 .  IBUPROFEN PO, Take 800 mg by mouth as needed., Disp: , Rfl:  .  Krill Oil 350 MG CAPS, Take 350 mg by mouth at bedtime. , Disp: , Rfl:  .  L-Methylfolate-B12-B6-B2 (METAFOLBIC) 06-22-48-5 MG TABS, Take 1 tablet by mouth daily., Disp: 90 tablet, Rfl: 3 .  levothyroxine (SYNTHROID) 150 MCG tablet, Take 150 mcg by mouth daily before breakfast., Disp: , Rfl:  .  methylphenidate (CONCERTA) 27 MG PO CR tablet, Take 1 tablet (27 mg total) by mouth 2 (two) times daily., Disp: 60 tablet, Rfl: 0 .  mirabegron ER (MYRBETRIQ) 50 MG TB24 tablet, Take 50 mg by mouth daily., Disp: , Rfl:  .  Multiple Minerals-Vitamins (CAL-MAG-ZINC-D PO), Take 3 tablets by mouth daily. , Disp: , Rfl:  .  Multiple Vitamins-Minerals (ONE-A-DAY WOMENS PETITES PO), Take 1 tablet by mouth 2 (two) times daily. , Disp: , Rfl:  .  NARCAN 4 MG/0.1ML LIQD nasal spray kit, Place 0.4 mg into the nose once., Disp: , Rfl:  .  NUCYNTA 50 MG tablet, Take 50 mg by mouth 3 (three) times daily., Disp: , Rfl:  .  omeprazole (PRILOSEC) 40 MG capsule, Take 1  capsule (40 mg total) by mouth daily before breakfast. (Patient taking differently: Take 40 mg by mouth daily.), Disp: 90 capsule, Rfl: 3 .  ondansetron (ZOFRAN-ODT) 4 MG disintegrating tablet, Take 4 mg by mouth every 8 (eight) hours as needed for nausea or vomiting., Disp: , Rfl:  .  oxyCODONE-acetaminophen (PERCOCET) 5-325 MG tablet, Take 1-2 tablets by  mouth every 4 (four) hours as needed for severe pain., Disp: 30 tablet, Rfl: 0 .  Pseudoephedrine HCl (SUDAFED PO), Take by mouth as needed., Disp: , Rfl:  .  Rimegepant Sulfate (NURTEC) 75 MG TBDP, Take 75 mg by mouth daily as needed., Disp: 8 tablet, Rfl: 2 .  rivaroxaban (XARELTO) 20 MG TABS tablet, Take 1 tablet (20 mg total) by mouth at bedtime., Disp: , Rfl:  .  Simethicone (PHAZYME ULTRA STRENGTH) 180 MG CAPS, Take 1 capsule (180 mg total) by mouth 3 (three) times daily as needed., Disp: , Rfl: 0 .  solifenacin (VESICARE) 5 MG tablet, at bedtime. , Disp: , Rfl:  .  tiZANidine (ZANAFLEX) 4 MG tablet, Take 1 tablet (4 mg total) by mouth every 6 (six) hours as needed for muscle spasms., Disp: 30 tablet, Rfl: 0 .  topiramate (TOPAMAX) 100 MG tablet, Take 1 tablet (100 mg total) by mouth 2 (two) times daily., Disp: 180 tablet, Rfl: 3 .  traZODone (DESYREL) 100 MG tablet, Take 200 mg by mouth at bedtime., Disp: , Rfl:  .  Turmeric (QC TUMERIC COMPLEX PO), Take by mouth 2 (two) times daily., Disp: , Rfl:  .  vitamin C (ASCORBIC ACID) 500 MG tablet, Take 500 mg by mouth daily., Disp: , Rfl:  .  VITAMIN D PO, Take 5,000 Units by mouth daily. , Disp: , Rfl:   Social History   Tobacco Use  Smoking Status Former Smoker  . Packs/day: 0.75  . Years: 8.00  . Pack years: 6.00  . Types: Cigarettes  . Quit date: 12/01/1990  . Years since quitting: 29.2  Smokeless Tobacco Never Used  Tobacco Comment   quit smoking in the 1990s    Allergies  Allergen Reactions  . Lyrica [Pregabalin] Shortness Of Breath and Swelling    lower extremity edema and  weight gain  . Belsomra [Suvorexant] Other (See Comments)    unknown  . Morphine And Related Itching    Upper torso  . Sulfamethoxazole-Trimethoprim Itching and Rash    Bactrim  . Tape Itching and Rash    Please use "paper" tape Rash if left on longer than 24 hrs   Objective:  There were no vitals filed for this visit. There is no height or weight on file to calculate BMI. Constitutional Well developed. Well nourished.  Vascular Foot warm and well perfused. Capillary refill normal to all digits.   Neurologic Normal speech. Oriented to person, place, and time. Epicritic sensation to light touch grossly present bilaterally.  Dermatologic Skin healing well without signs of infection. Skin edges well coapted without signs of infection.  Orthopedic: Tenderness to palpation noted about the surgical site.   Radiographs: 3 views of skeletally mature the right foot.  No hardware loosening or breaking noted.  Good correction alignment noted.  The pins are intact.  Assessment:   1. Hallux valgus (acquired), right foot   2. Hallux hammertoe, right   3. Status post right foot surgery    Plan:  Patient was evaluated and treated and all questions answered.  S/p foot surgery right -Progressing as expected post-operatively. -XR: See above -WB Status: Nonweightbearing to the right lower extremity -Sutures: Intact.  No signs of dehiscence noted.  No complication noted. -Medications: None -Foot redressed.  No follow-ups on file.

## 2020-03-02 ENCOUNTER — Encounter (INDEPENDENT_AMBULATORY_CARE_PROVIDER_SITE_OTHER): Payer: Self-pay | Admitting: Internal Medicine

## 2020-03-02 ENCOUNTER — Ambulatory Visit (INDEPENDENT_AMBULATORY_CARE_PROVIDER_SITE_OTHER): Payer: PPO | Admitting: Internal Medicine

## 2020-03-02 ENCOUNTER — Encounter: Payer: PPO | Admitting: Podiatry

## 2020-03-02 ENCOUNTER — Other Ambulatory Visit: Payer: Self-pay

## 2020-03-02 DIAGNOSIS — R7989 Other specified abnormal findings of blood chemistry: Secondary | ICD-10-CM | POA: Insufficient documentation

## 2020-03-02 DIAGNOSIS — R945 Abnormal results of liver function studies: Secondary | ICD-10-CM | POA: Diagnosis not present

## 2020-03-02 LAB — HEPATIC FUNCTION PANEL
AG Ratio: 1.8 (calc) (ref 1.0–2.5)
ALT: 83 U/L — ABNORMAL HIGH (ref 6–29)
AST: 29 U/L (ref 10–35)
Albumin: 3.9 g/dL (ref 3.6–5.1)
Alkaline phosphatase (APISO): 167 U/L — ABNORMAL HIGH (ref 37–153)
Bilirubin, Direct: 0.1 mg/dL (ref 0.0–0.2)
Globulin: 2.2 g/dL (calc) (ref 1.9–3.7)
Indirect Bilirubin: 0.3 mg/dL (calc) (ref 0.2–1.2)
Total Bilirubin: 0.4 mg/dL (ref 0.2–1.2)
Total Protein: 6.1 g/dL (ref 6.1–8.1)

## 2020-03-02 NOTE — Progress Notes (Signed)
Presenting complaint;  Abnormal LFTs. AST 937, ALT 694 and AP of 317 on 02/25/2020  Database and subjective:  Patient is 60 year old Caucasian female with multiple medical problems which include history of CVA hypothyroidism fibromyalgia deep venous thrombosis osteoarthrosis of multiple joints chronic back pain ADD, GERD who has history of intermittent elevation of transaminases dating back to February 2017.  She has undergone extensive work-up in the past.  Our impression has been that she has sphincter of Oddi dysfunction or microlithiasis.  She used to have pain in left upper quadrant but not lately.  She was seen in the office in August 2021 and her AST and ALT were 3 6 and 27 respectively.  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  She has not been interested in going to tertiary center. Patient had blood work as a part of wellness visit and noted to have elevated alkaline phosphatase AST and ALT as above.  She was advised to follow-up with [place]. She states she has been taking more pain medication since she had foot surgery.  She was also taking Tylenol every day along with oxycodone.  She was advised to stop Tylenol when she was found to have elevated transaminases.  She states she has had more nausea since her surgery.  She is getting relief with Zofran.  She had left foot surgery on 02/12/2020.  In addition to [place] she also had been taking Percocet.  She states she developed postop infection and now has been on doxycycline.  She does not feel she is have any side effects with doxycycline.  She denies fever or chills.  She is prone to constipation.  She takes Colace and/or Dulcolax on as-needed basis.  She has not experienced melena or rectal bleeding.  She is also using ibuprofen on as-needed basis. She has not lost any weight since she was last seen  Current Medications: Outpatient Encounter Medications as of  03/02/2020  Medication Sig  . acetaminophen (TYLENOL) 500 MG tablet Take 1,000 mg by mouth every 6 (six) hours as needed.  Marland Kitchen amoxicillin (AMOXIL) 500 MG capsule Take 2,000 mg by mouth See admin instructions. Take 2000 mg by mouth 1 hour prior to dental appointment  . baclofen (LIORESAL) 10 MG tablet Take 10 mg by mouth 3 (three) times daily.   . bisacodyl (DULCOLAX) 5 MG EC tablet Take 5 mg by mouth daily as needed for moderate constipation.  . Cyanocobalamin (VITAMIN B-12) 5000 MCG SUBL Place 5,000 mcg under the tongue daily.   . diclofenac sodium (VOLTAREN) 1 % GEL Apply 4 g topically 4 (four) times daily as needed (PAIN).  Marland Kitchen docusate sodium (COLACE) 100 MG capsule Take 100 mg by mouth as needed for mild constipation.  Marland Kitchen donepezil (ARICEPT) 10 MG tablet Take 1 tablet (10 mg total) by mouth at bedtime.  Marland Kitchen doxycycline (VIBRA-TABS) 100 MG tablet Take 1 tablet (100 mg total) by mouth 2 (two) times daily.  . DULoxetine (CYMBALTA) 60 MG capsule Take 60 mg by mouth 2 (two) times daily.  Eduard Roux (AIMOVIG) 70 MG/ML SOAJ Inject 70 mg into the skin every 14 (fourteen) days.  . famotidine (PEPCID) 40 MG tablet Take 0.5 tablets (20 mg total) by mouth daily.  . fluconazole (DIFLUCAN) 150 MG tablet Take 150 mg by mouth daily as needed (yeast infection). Finishes Sunday 02-07-2020 for yeast infection  . folic acid (FOLVITE) 960 MCG tablet Take 400 mcg by mouth  daily.  . gabapentin (NEURONTIN) 800 MG tablet Take 1 tablet (800 mg total) by mouth 3 (three) times daily.  Marland Kitchen ibuprofen (ADVIL) 800 MG tablet Take 1 tablet (800 mg total) by mouth every 6 (six) hours as needed.  . IBUPROFEN PO Take 800 mg by mouth as needed.  Javier Docker Oil 350 MG CAPS Take 350 mg by mouth at bedtime.   Marland Kitchen L-Methylfolate-B12-B6-B2 (METAFOLBIC) 06-22-48-5 MG TABS Take 1 tablet by mouth daily.  Marland Kitchen levothyroxine (SYNTHROID) 150 MCG tablet Take 175 mcg by mouth daily before breakfast.  . methylphenidate (CONCERTA) 27 MG PO CR tablet Take 1  tablet (27 mg total) by mouth 2 (two) times daily.  . mirabegron ER (MYRBETRIQ) 50 MG TB24 tablet Take 50 mg by mouth daily.  . Multiple Minerals-Vitamins (CAL-MAG-ZINC-D PO) Take 3 tablets by mouth daily.   . Multiple Vitamins-Minerals (ONE-A-DAY WOMENS PETITES PO) Take 1 tablet by mouth 2 (two) times daily.   Marland Kitchen NARCAN 4 MG/0.1ML LIQD nasal spray kit Place 0.4 mg into the nose once.  . NUCYNTA 50 MG tablet Take 50 mg by mouth 3 (three) times daily.  Marland Kitchen omeprazole (PRILOSEC) 40 MG capsule Take 1 capsule (40 mg total) by mouth daily before breakfast. (Patient taking differently: Take 40 mg by mouth daily.)  . ondansetron (ZOFRAN-ODT) 4 MG disintegrating tablet Take 4 mg by mouth every 8 (eight) hours as needed for nausea or vomiting.  . Pseudoephedrine HCl (SUDAFED PO) Take by mouth as needed.  . Rimegepant Sulfate (NURTEC) 75 MG TBDP Take 75 mg by mouth daily as needed.  . rivaroxaban (XARELTO) 20 MG TABS tablet Take 1 tablet (20 mg total) by mouth at bedtime.  . Simethicone (PHAZYME ULTRA STRENGTH) 180 MG CAPS Take 1 capsule (180 mg total) by mouth 3 (three) times daily as needed.  . solifenacin (VESICARE) 5 MG tablet at bedtime.   Marland Kitchen tiZANidine (ZANAFLEX) 4 MG tablet Take 1 tablet (4 mg total) by mouth every 6 (six) hours as needed for muscle spasms.  Marland Kitchen topiramate (TOPAMAX) 100 MG tablet Take 1 tablet (100 mg total) by mouth 2 (two) times daily.  . traZODone (DESYREL) 100 MG tablet Take 200 mg by mouth at bedtime.  . Turmeric (QC TUMERIC COMPLEX PO) Take by mouth 2 (two) times daily.  . vitamin C (ASCORBIC ACID) 500 MG tablet Take 500 mg by mouth daily.  Marland Kitchen VITAMIN D PO Take 5,000 Units by mouth daily.   Marland Kitchen oxyCODONE-acetaminophen (PERCOCET) 10-325 MG tablet Take 1 tablet by mouth every 4 (four) hours as needed for pain. (Patient not taking: Reported on 03/02/2020)  . [DISCONTINUED] oxyCODONE-acetaminophen (PERCOCET) 5-325 MG tablet Take 1-2 tablets by mouth every 4 (four) hours as needed for severe  pain. (Patient not taking: Reported on 03/02/2020)   No facility-administered encounter medications on file as of 03/02/2020.     Objective: Blood pressure 127/75, pulse 80, temperature 98 F (36.7 C), temperature source Oral, height '5\' 5"'  (1.651 m), weight 205 lb (93 kg). Patient is alert and in no acute distress She is wearing a mask. Conjunctiva is pink. Sclera is nonicteric Oropharyngeal mucosa is normal. No neck masses or thyromegaly noted. Cardiac exam with regular rhythm normal S1 and S2. No murmur or gallop noted. Lungs are clear to auscultation. Abdomen is symmetrical.  Bowel sounds are normal.  On palpation abdomen is soft and nontender with organomegaly or masses No LE edema or clubbing noted.  Labs/studies Results:  CBC Latest Ref Rng & Units 10/14/2018 07/26/2017  05/01/2017  WBC 3.8 - 10.8 Thousand/uL 5.6 5.0 -  Hemoglobin 11.7 - 15.5 g/dL 13.4 11.9(L) 12.9  Hematocrit 35.0 - 45.0 % 40.6 36.3 38.0  Platelets 140 - 400 Thousand/uL 215 186 -    CMP Latest Ref Rng & Units 09/22/2019 03/09/2019 02/03/2019  Glucose 65 - 139 mg/dL - - -  BUN 7 - 25 mg/dL - - -  Creatinine 0.50 - 1.05 mg/dL - - -  Sodium 135 - 146 mmol/L - - -  Potassium 3.5 - 5.3 mmol/L - - -  Chloride 98 - 110 mmol/L - - -  CO2 20 - 32 mmol/L - - -  Calcium 8.6 - 10.4 mg/dL - - -  Total Protein 6.1 - 8.1 g/dL 6.4 6.5 6.4  Total Bilirubin 0.2 - 1.2 mg/dL 0.4 0.6 0.4  Alkaline Phos 38 - 126 U/L - 83 -  AST 10 - 35 U/L 26 31 100(H)  ALT 6 - 29 U/L 27 50(H) 142(H)    Hepatic Function Latest Ref Rng & Units 09/22/2019 03/09/2019 02/03/2019  Total Protein 6.1 - 8.1 g/dL 6.4 6.5 6.4  Albumin 3.5 - 5.0 g/dL - 3.9 -  AST 10 - 35 U/L 26 31 100(H)  ALT 6 - 29 U/L 27 50(H) 142(H)  Alk Phosphatase 38 - 126 U/L - 83 -  Total Bilirubin 0.2 - 1.2 mg/dL 0.4 0.6 0.4  Bilirubin, Direct 0.0 - 0.2 mg/dL 0.1 0.1 0.1     AST 407 and ALT 221 on 05/01/2017 AST  1112 and ALT 986 on 10/22/2018 AST  32 and ALT 170 on  10/27/2018  Imaging studies reviewed  MRCP 03/06/2017 bile duct measured 11 mm with fusiform shape but no choledocholithiasis.  Evidence of cholecystectomy. Abdominal ultrasound on 03/09/2019.  CBD dilated at 12 mm.  No choledocholithiasis.  Intrahepatic biliary radicles are dilated as well.    Assessment:  #1.  History of elevated transaminases with fluctuating pattern since February 2017.  She also has had periods where her transaminases have been normal such as on 09/22/2019 when AST was 26 and ALT 27.  Now her numbers are elevated again.  She has no symptoms to suggest biliary colic or acute hepatitis.  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  She is on chronic narcotic therapy therefore her pain could be masked.  It is possible that current bump in transaminases may be due to intermittent narcotic therapy following foot surgery.  She is on doxycycline which could be the culprit.  If this is the case I expect her numbers to be higher than they were last week.  Over the last few years she has been screened for other disorders and all the studies have been negative. She had MRCP in February 2019 and bile duct was 11 mm and there were no stricture or choledocholithiasis.  Ultrasound 1 year ago revealed intra and extrahepatic biliary dilation.  CBD measured 12 mm.  #2.  Chronic GERD.  Heartburn is well controlled with combination of PPI and  H2B.  Plan:  Patient will go to the lab for stat LFTs. Abdominal ultrasound. Further recommendations to follow.

## 2020-03-02 NOTE — Patient Instructions (Signed)
Physician will call with results of blood work and ultrasound when completed. 

## 2020-03-04 ENCOUNTER — Encounter: Payer: PPO | Admitting: Podiatry

## 2020-03-07 ENCOUNTER — Other Ambulatory Visit: Payer: Self-pay | Admitting: Podiatry

## 2020-03-07 MED ORDER — OXYCODONE-ACETAMINOPHEN 10-325 MG PO TABS: 1.0000 | ORAL_TABLET | ORAL | 0 refills | Status: DC | PRN

## 2020-03-09 ENCOUNTER — Ambulatory Visit (INDEPENDENT_AMBULATORY_CARE_PROVIDER_SITE_OTHER): Payer: PPO | Admitting: Podiatry

## 2020-03-09 ENCOUNTER — Encounter (INDEPENDENT_AMBULATORY_CARE_PROVIDER_SITE_OTHER): Payer: Self-pay

## 2020-03-09 ENCOUNTER — Other Ambulatory Visit: Payer: Self-pay

## 2020-03-09 ENCOUNTER — Ambulatory Visit (INDEPENDENT_AMBULATORY_CARE_PROVIDER_SITE_OTHER): Payer: PPO

## 2020-03-09 DIAGNOSIS — M2011 Hallux valgus (acquired), right foot: Secondary | ICD-10-CM

## 2020-03-09 DIAGNOSIS — T8131XA Disruption of external operation (surgical) wound, not elsewhere classified, initial encounter: Secondary | ICD-10-CM

## 2020-03-09 DIAGNOSIS — M2031 Hallux varus (acquired), right foot: Secondary | ICD-10-CM

## 2020-03-09 DIAGNOSIS — Z9889 Other specified postprocedural states: Secondary | ICD-10-CM

## 2020-03-09 MED ORDER — SANTYL 250 UNIT/GM EX OINT: 1.0000 "application " | TOPICAL_OINTMENT | Freq: Every day | CUTANEOUS | 0 refills | Status: DC

## 2020-03-09 MED ORDER — DOXYCYCLINE HYCLATE 100 MG PO TABS: 100.0000 mg | ORAL_TABLET | Freq: Two times a day (BID) | ORAL | 0 refills | Status: DC

## 2020-03-10 ENCOUNTER — Encounter (INDEPENDENT_AMBULATORY_CARE_PROVIDER_SITE_OTHER): Payer: Self-pay

## 2020-03-10 ENCOUNTER — Telehealth (INDEPENDENT_AMBULATORY_CARE_PROVIDER_SITE_OTHER): Payer: Self-pay

## 2020-03-10 ENCOUNTER — Encounter: Payer: Self-pay | Admitting: Podiatry

## 2020-03-10 ENCOUNTER — Ambulatory Visit (HOSPITAL_COMMUNITY)
Admission: RE | Admit: 2020-03-10 | Discharge: 2020-03-10 | Disposition: A | Discharge status: Home / Self Care | Payer: PPO | Source: Ambulatory Visit | Attending: Internal Medicine | Admitting: Internal Medicine

## 2020-03-10 DIAGNOSIS — R748 Abnormal levels of other serum enzymes: Secondary | ICD-10-CM | POA: Diagnosis not present

## 2020-03-10 DIAGNOSIS — R945 Abnormal results of liver function studies: Secondary | ICD-10-CM | POA: Diagnosis not present

## 2020-03-10 NOTE — Telephone Encounter (Signed)
Ultrasound has not been read yet I reviewed the images and bile duct is 15 mm Results given to patient  Debbie Pineda, please send request to Dr. Gayleen Orem at [facility] for ERCP with sphincterotomy. 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 I will also try to contact him tomorrow but you can send the paper Patient is aware

## 2020-03-10 NOTE — Progress Notes (Signed)
Subjective:  Patient ID: Debbie Pineda, female    DOB: Mar 28, 1960,  MRN: [id number]  Chief Complaint  Patient presents with  . Routine Post Op    Post op       60 y.o. female returns for post-op check.  Patient states she is doing well.  She still has some pain but overall much better.  Incisions have healed.  She would like to discuss what the next treatment options are.  She would like to know if her pain scale, today.  Review of Systems: Negative except as noted in the HPI. Denies N/V/F/Ch.  Past Medical History:  Diagnosis Date  . ADD (attention deficit disorder)   . Arthritis    "all over"  . Cerebral infarction ([place]) 10/30/2011  . Cerebrovascular disease 08/14/2016  . Chronic back pain    "all over"  . Chronic low back pain 01/11/2016  . Complication of anesthesia    tends to have hypotension when NPO and post-anesthesia  . Constipation    takes stool softener daily  . Degenerative disk disease   . Degenerative joint disease   . DVT (deep venous thrombosis) ([place]) 2014   RLE  . Family history of adverse reaction to anesthesia    a family member woke up during surgery; "think it was my mom"  . Fibromyalgia   . Generalized osteoarthritis of multiple sites 11/04/2013  . GERD (gastroesophageal reflux disease)   . Heart palpitations 12/14/2013   resolved  . History of blood clots    superficial  . Hypoglycemia   . Hypothyroid    takes Synthroid daily  . Incomplete emptying of bladder   . Insomnia    takes Trazodone nightly  . Iron deficiency anemia    takes Ferrous Sulfate daily  . Joint pain   . Joint swelling    knees and ankles  . Memory disorder 08/14/2016  . Morbid obesity ([place])   . Nausea    takes Zofran as needed.Seeing GI doc  . Neck pain 05/25/2016  . OSA on CPAP    tested more than 5 yrs ago.    . Osteoarthritis   . Osteopenia    in feet  . PFO (patent foramen ovale)    no murmur  . Pre-diabetes   . Primary osteoarthritis of both feet  05/29/2016   Right bunionectomy August 2017 by Dr. Sharol Given  . Scoliosis   . Skin abnormality 02/04/2020   raised area on lip  . Sleep apnea    mild osa per pt  . Stroke [facility]) "several"last 2014   right foot weakness; memory issues, black spot right visual field since" (03/23/2015)  . Thrombophlebitis   . Trochanteric bursitis of both hips 05/25/2016  . Unilateral primary osteoarthritis, right knee 05/29/2016  . Urinary urgency 04/19/2016  . Vein disorder 11/29/2010   varicose veins both legs  . Wears glasses     Current Outpatient Medications:  .  collagenase (SANTYL) ointment, Apply 1 application topically daily., Disp: 15 g, Rfl: 0 .  acetaminophen (TYLENOL) 500 MG tablet, Take 1,000 mg by mouth every 6 (six) hours as needed., Disp: , Rfl:  .  amoxicillin (AMOXIL) 500 MG capsule, Take 2,000 mg by mouth See admin instructions. Take 2000 mg by mouth 1 hour prior to dental appointment, Disp: , Rfl:  .  baclofen (LIORESAL) 10 MG tablet, Take 10 mg by mouth 3 (three) times daily. , Disp: , Rfl: 1 .  bisacodyl (DULCOLAX) 5 MG EC tablet, Take 5  mg by mouth daily as needed for moderate constipation., Disp: , Rfl:  .  Cyanocobalamin (VITAMIN B-12) 5000 MCG SUBL, Place 5,000 mcg under the tongue daily. , Disp: , Rfl:  .  diclofenac sodium (VOLTAREN) 1 % GEL, Apply 4 g topically 4 (four) times daily as needed (PAIN)., Disp: 5 Tube, Rfl: 1 .  docusate sodium (COLACE) 100 MG capsule, Take 100 mg by mouth as needed for mild constipation., Disp: , Rfl:  .  donepezil (ARICEPT) 10 MG tablet, Take 1 tablet (10 mg total) by mouth at bedtime., Disp: 90 tablet, Rfl: 3 .  doxycycline (VIBRA-TABS) 100 MG tablet, Take 1 tablet (100 mg total) by mouth 2 (two) times daily., Disp: 20 tablet, Rfl: 0 .  DULoxetine (CYMBALTA) 60 MG capsule, Take 60 mg by mouth 2 (two) times daily., Disp: , Rfl: 11 .  Erenumab-aooe (AIMOVIG) 70 MG/ML SOAJ, Inject 70 mg into the skin every 14 (fourteen) days., Disp: 2.24 mL, Rfl: 5 .   famotidine (PEPCID) 40 MG tablet, Take 0.5 tablets (20 mg total) by mouth daily., Disp: 90 tablet, Rfl: 3 .  fluconazole (DIFLUCAN) 150 MG tablet, Take 150 mg by mouth daily as needed (yeast infection). Finishes Sunday 02-07-2020 for yeast infection, Disp: , Rfl:  .  folic acid (FOLVITE) 568 MCG tablet, Take 400 mcg by mouth daily., Disp: , Rfl:  .  gabapentin (NEURONTIN) 800 MG tablet, Take 1 tablet (800 mg total) by mouth 3 (three) times daily., Disp: , Rfl:  .  IBUPROFEN PO, Take 800 mg by mouth as needed., Disp: , Rfl:  .  Krill Oil 350 MG CAPS, Take 350 mg by mouth at bedtime. , Disp: , Rfl:  .  L-Methylfolate-B12-B6-B2 (METAFOLBIC) 06-22-48-5 MG TABS, Take 1 tablet by mouth daily., Disp: 90 tablet, Rfl: 3 .  levothyroxine (SYNTHROID) 150 MCG tablet, Take 175 mcg by mouth daily before breakfast., Disp: , Rfl:  .  methylphenidate (CONCERTA) 27 MG PO CR tablet, Take 1 tablet (27 mg total) by mouth 2 (two) times daily., Disp: 60 tablet, Rfl: 0 .  mirabegron ER (MYRBETRIQ) 50 MG TB24 tablet, Take 50 mg by mouth daily., Disp: , Rfl:  .  Multiple Minerals-Vitamins (CAL-MAG-ZINC-D PO), Take 3 tablets by mouth daily. , Disp: , Rfl:  .  Multiple Vitamins-Minerals (ONE-A-DAY WOMENS PETITES PO), Take 1 tablet by mouth 2 (two) times daily. , Disp: , Rfl:  .  NARCAN 4 MG/0.1ML LIQD nasal spray kit, Place 0.4 mg into the nose once., Disp: , Rfl:  .  NUCYNTA 50 MG tablet, Take 50 mg by mouth 3 (three) times daily., Disp: , Rfl:  .  omeprazole (PRILOSEC) 40 MG capsule, Take 1 capsule (40 mg total) by mouth daily before breakfast. (Patient taking differently: Take 40 mg by mouth daily.), Disp: 90 capsule, Rfl: 3 .  ondansetron (ZOFRAN-ODT) 4 MG disintegrating tablet, Take 4 mg by mouth every 8 (eight) hours as needed for nausea or vomiting., Disp: , Rfl:  .  oxyCODONE-acetaminophen (PERCOCET) 10-325 MG tablet, Take 1 tablet by mouth every 4 (four) hours as needed for pain. (Patient not taking: Reported on  03/02/2020), Disp: 30 tablet, Rfl: 0 .  oxyCODONE-acetaminophen (PERCOCET) 10-325 MG tablet, Take 1 tablet by mouth every 4 (four) hours as needed for pain., Disp: 30 tablet, Rfl: 0 .  Pseudoephedrine HCl (SUDAFED PO), Take by mouth as needed., Disp: , Rfl:  .  Rimegepant Sulfate (NURTEC) 75 MG TBDP, Take 75 mg by mouth daily as needed., Disp: 8  tablet, Rfl: 2 .  rivaroxaban (XARELTO) 20 MG TABS tablet, Take 1 tablet (20 mg total) by mouth at bedtime., Disp: , Rfl:  .  Simethicone (PHAZYME ULTRA STRENGTH) 180 MG CAPS, Take 1 capsule (180 mg total) by mouth 3 (three) times daily as needed., Disp: , Rfl: 0 .  solifenacin (VESICARE) 5 MG tablet, at bedtime. , Disp: , Rfl:  .  tiZANidine (ZANAFLEX) 4 MG tablet, Take 1 tablet (4 mg total) by mouth every 6 (six) hours as needed for muscle spasms., Disp: 30 tablet, Rfl: 0 .  topiramate (TOPAMAX) 100 MG tablet, Take 1 tablet (100 mg total) by mouth 2 (two) times daily., Disp: 180 tablet, Rfl: 3 .  traZODone (DESYREL) 100 MG tablet, Take 200 mg by mouth at bedtime., Disp: , Rfl:  .  Turmeric (QC TUMERIC COMPLEX PO), Take by mouth 2 (two) times daily., Disp: , Rfl:  .  vitamin C (ASCORBIC ACID) 500 MG tablet, Take 500 mg by mouth daily., Disp: , Rfl:  .  VITAMIN D PO, Take 5,000 Units by mouth daily. , Disp: , Rfl:   Social History   Tobacco Use  Smoking Status Former Smoker  . Packs/day: 0.75  . Years: 8.00  . Pack years: 6.00  . Types: Cigarettes  . Quit date: 12/01/1990  . Years since quitting: 29.2  Smokeless Tobacco Never Used  Tobacco Comment   quit smoking in the 1990s    Allergies  Allergen Reactions  . Lyrica [Pregabalin] Shortness Of Breath and Swelling    lower extremity edema and weight gain  . Belsomra [Suvorexant] Other (See Comments)    unknown  . Morphine And Related Itching    Upper torso  . Sulfamethoxazole-Trimethoprim Itching and Rash    Bactrim  . Tape Itching and Rash    Please use "paper" tape Rash if left on longer  than 24 hrs   Objective:  There were no vitals filed for this visit. There is no height or weight on file to calculate BMI. Constitutional Well developed. Well nourished.  Vascular Foot warm and well perfused. Capillary refill normal to all digits.   Neurologic Normal speech. Oriented to person, place, and time. Epicritic sensation to light touch grossly present bilaterally.  Dermatologic  skin well coapted except for proximal superficial dehiscence without exposure of hardware.  No clinical signs of infection noted.  Orthopedic: Tenderness to palpation noted about the surgical site.   Radiographs: 3 views of skeletally mature the right foot.  No hardware loosening or breaking noted.  Good correction alignment noted.  The pins are intact.  Assessment:   1. Hallux valgus (acquired), right foot   2. Hallux hammertoe, right   3. Status post right foot surgery   4. Superficial disruption or dehiscence of operation wound, initial encounter    Plan:  Patient was evaluated and treated and all questions answered.  S/p foot surgery right -Progressing as expected post-operatively. -XR: See above -WB Status: Partial weightbearing to the heel right lower extremity with a cam boot -Sutures: Removed and pins were removed.  No signs of dehiscence noted.  No complication noted. -Medications: None -Superficial dehiscence noted to the proximal part of the skin incision.  No exposure of hardware noted.  Incision is measuring about 0.5 cm x 0.2 cm x 0.3 cm.  There is fibrogranular wound bed.  I encouraged her to utilize and do Santyl wet-to-dry dressing changes.  She states understanding.  I instructed her to do once a day.  No follow-ups on file.

## 2020-03-15 NOTE — Telephone Encounter (Signed)
Referral sent to [facility] 

## 2020-03-16 ENCOUNTER — Encounter (INDEPENDENT_AMBULATORY_CARE_PROVIDER_SITE_OTHER): Payer: Self-pay | Admitting: *Deleted

## 2020-03-17 ENCOUNTER — Other Ambulatory Visit: Payer: Self-pay | Admitting: Podiatry

## 2020-03-17 ENCOUNTER — Telehealth: Payer: PPO | Admitting: Podiatry

## 2020-03-17 MED ORDER — OXYCODONE-ACETAMINOPHEN 10-325 MG PO TABS: 1.0000 | ORAL_TABLET | ORAL | 0 refills | Status: DC | PRN

## 2020-03-17 NOTE — Telephone Encounter (Signed)
Patient calling to request a refill on pain medication oxyCODONE-acetaminophen (PERCOCET) Debbie Pineda is out of the office this week)

## 2020-03-17 NOTE — Telephone Encounter (Signed)
sent 

## 2020-03-21 ENCOUNTER — Other Ambulatory Visit: Payer: Self-pay | Admitting: Podiatry

## 2020-03-21 DIAGNOSIS — E7849 Other hyperlipidemia: Secondary | ICD-10-CM | POA: Diagnosis not present

## 2020-03-21 DIAGNOSIS — M797 Fibromyalgia: Secondary | ICD-10-CM | POA: Diagnosis not present

## 2020-03-21 DIAGNOSIS — M1711 Unilateral primary osteoarthritis, right knee: Secondary | ICD-10-CM | POA: Diagnosis not present

## 2020-03-21 DIAGNOSIS — E063 Autoimmune thyroiditis: Secondary | ICD-10-CM | POA: Diagnosis not present

## 2020-03-21 MED ORDER — OXYCODONE-ACETAMINOPHEN 10-325 MG PO TABS: 1.0000 | ORAL_TABLET | ORAL | 0 refills | Status: DC | PRN

## 2020-03-22 ENCOUNTER — Ambulatory Visit (INDEPENDENT_AMBULATORY_CARE_PROVIDER_SITE_OTHER): Payer: PPO | Admitting: Internal Medicine

## 2020-03-29 ENCOUNTER — Other Ambulatory Visit: Payer: Self-pay | Admitting: Podiatry

## 2020-03-29 MED ORDER — OXYCODONE-ACETAMINOPHEN 10-325 MG PO TABS: 1.0000 | ORAL_TABLET | ORAL | 0 refills | Status: DC | PRN

## 2020-04-05 DIAGNOSIS — Z9884 Bariatric surgery status: Secondary | ICD-10-CM | POA: Diagnosis not present

## 2020-04-05 DIAGNOSIS — R7989 Other specified abnormal findings of blood chemistry: Secondary | ICD-10-CM | POA: Diagnosis not present

## 2020-04-05 DIAGNOSIS — K838 Other specified diseases of biliary tract: Secondary | ICD-10-CM | POA: Diagnosis not present

## 2020-04-06 ENCOUNTER — Ambulatory Visit (INDEPENDENT_AMBULATORY_CARE_PROVIDER_SITE_OTHER): Payer: PPO | Admitting: Podiatry

## 2020-04-06 ENCOUNTER — Other Ambulatory Visit: Payer: Self-pay

## 2020-04-06 ENCOUNTER — Ambulatory Visit (INDEPENDENT_AMBULATORY_CARE_PROVIDER_SITE_OTHER): Payer: PPO

## 2020-04-06 DIAGNOSIS — Z9889 Other specified postprocedural states: Secondary | ICD-10-CM | POA: Diagnosis not present

## 2020-04-06 DIAGNOSIS — M2011 Hallux valgus (acquired), right foot: Secondary | ICD-10-CM

## 2020-04-06 DIAGNOSIS — M2031 Hallux varus (acquired), right foot: Secondary | ICD-10-CM

## 2020-04-06 MED ORDER — OXYCODONE-ACETAMINOPHEN 5-325 MG PO TABS: 1.0000 | ORAL_TABLET | ORAL | 0 refills | Status: DC | PRN

## 2020-04-06 NOTE — Progress Notes (Signed)
Dg  

## 2020-04-08 ENCOUNTER — Telehealth: Payer: Self-pay | Admitting: *Deleted

## 2020-04-08 NOTE — Telephone Encounter (Signed)
   [facility] Pre-operative Risk Assessment    HEARTCARE STAFF: - Please ensure there is not already an duplicate clearance open for this procedure. - Under Visit Info/Reason for Call, type in Other and utilize the format Clearance MM/DD/YY or Clearance TBD. Do not use dashes or single digits. - If request is for dental extraction, please clarify the # of teeth to be extracted.  Request for surgical clearance:  1. What type of surgery is being performed? EUS/ERCP   2. When is this surgery scheduled? TBD   3. What type of clearance is required (medical clearance vs. Pharmacy clearance to hold med vs. Both)? BOTH  4. Are there any medications that need to be held prior to surgery and how long? XARELTO (PER ASGE GUIDELINES: HOLD XARELTO 1 DAY IF CrCl >90 or 2 DAYS IF 60-90 or 3 DAYS IF 30-59 or 4 DAYS IF 15-20)   5. Practice name and name of physician performing surgery? [facility]; MD NOT LISTED   6. What is the office phone number? [phone]   7.   What is the office fax number? [phone]  8.   Anesthesia type (None, local, MAC, general) ? NONE LISTED   Julaine Hua 04/08/2020, 10:36 AM  _________________________________________________________________   (provider comments below)

## 2020-04-08 NOTE — Telephone Encounter (Signed)
Patient returning call.

## 2020-04-08 NOTE — Telephone Encounter (Signed)
   Primary Cardiologist: Jenkins Rouge, MD  Chart reviewed as part of pre-operative protocol coverage. Patient was contacted 04/08/2020 in reference to pre-operative risk assessment for pending surgery as outlined below.  Debbie Pineda was last seen on 01/11/20 by Dr. Johnsie Cancel.  Since that day, Debbie Pineda has done okay from a cardiac standpoint. She has good days and bad days with her back. Activity is significantly limited by back pain. When pain is well controlled she can complete 4 METs without anginal complaints.   Therefore, based on ACC/AHA guidelines, the patient would be at acceptable risk for the planned procedure without further cardiovascular testing.   The patient was advised that if she develops new symptoms prior to surgery to contact our office to arrange for a follow-up visit, and she verbalized understanding.  Appears patient is on xarelto for history of factor V leiden deficiency with history of CVA, last prescribed by Dr. Laural Golden (GI). Will defer recommendations for holding xarelto to PCP/GI.   I will route this recommendation to the requesting party via Epic fax function and remove from pre-op pool. Please call with questions.  Abigail Butts, PA-C 04/08/2020, 4:13 PM

## 2020-04-08 NOTE — Telephone Encounter (Signed)
   Primary Cardiologist: Jenkins Rouge, MD  Chart reviewed as part of pre-operative protocol coverage.  Left a voicemail for patient to call back for ongoing preop assessment.  Appears patient is on xarelto for history of factor V leiden deficiency with history of CVA, last prescribed by Dr. Laural Golden (GI). Will defer recommendations for holding xarelto to PCP/GI.   Abigail Butts, PA-C 04/08/2020, 2:27 PM

## 2020-04-09 ENCOUNTER — Other Ambulatory Visit: Payer: Self-pay | Admitting: Podiatry

## 2020-04-09 DIAGNOSIS — Z9889 Other specified postprocedural states: Secondary | ICD-10-CM

## 2020-04-12 ENCOUNTER — Encounter: Payer: Self-pay | Admitting: Podiatry

## 2020-04-12 NOTE — Progress Notes (Signed)
Subjective:  Patient ID: Debbie Pineda, female    DOB: 1960/04/16,  MRN: [id number]  Chief Complaint  Patient presents with  . Routine Post Op     60 y.o. female returns for post-op check.  Patient states she is doing well.  She still has some pain but overall much better.  The incision seem to have a small superficial dehiscence that she has been treating locally.  She is doing much better.  She would like to know if she can do physical therapy.  Review of Systems: Negative except as noted in the HPI. Denies N/V/F/Ch.  Past Medical History:  Diagnosis Date  . ADD (attention deficit disorder)   . Arthritis    "all over"  . Cerebral infarction ([place]) 10/30/2011  . Cerebrovascular disease 08/14/2016  . Chronic back pain    "all over"  . Chronic low back pain 01/11/2016  . Complication of anesthesia    tends to have hypotension when NPO and post-anesthesia  . Constipation    takes stool softener daily  . Degenerative disk disease   . Degenerative joint disease   . DVT (deep venous thrombosis) ([place]) 2014   RLE  . Family history of adverse reaction to anesthesia    a family member woke up during surgery; "think it was my mom"  . Fibromyalgia   . Generalized osteoarthritis of multiple sites 11/04/2013  . GERD (gastroesophageal reflux disease)   . Heart palpitations 12/14/2013   resolved  . History of blood clots    superficial  . Hypoglycemia   . Hypothyroid    takes Synthroid daily  . Incomplete emptying of bladder   . Insomnia    takes Trazodone nightly  . Iron deficiency anemia    takes Ferrous Sulfate daily  . Joint pain   . Joint swelling    knees and ankles  . Memory disorder 08/14/2016  . Morbid obesity ([place])   . Nausea    takes Zofran as needed.Seeing GI doc  . Neck pain 05/25/2016  . OSA on CPAP    tested more than 5 yrs ago.    . Osteoarthritis   . Osteopenia    in feet  . PFO (patent foramen ovale)    no murmur  . Pre-diabetes   . Primary  osteoarthritis of both feet 05/29/2016   Right bunionectomy August 2017 by Dr. Sharol Given  . Scoliosis   . Skin abnormality 02/04/2020   raised area on lip  . Sleep apnea    mild osa per pt  . Stroke [facility]) "several"last 2014   right foot weakness; memory issues, black spot right visual field since" (03/23/2015)  . Thrombophlebitis   . Trochanteric bursitis of both hips 05/25/2016  . Unilateral primary osteoarthritis, right knee 05/29/2016  . Urinary urgency 04/19/2016  . Vein disorder 11/29/2010   varicose veins both legs  . Wears glasses     Current Outpatient Medications:  .  oxyCODONE-acetaminophen (PERCOCET) 5-325 MG tablet, Take 1-2 tablets by mouth every 4 (four) hours as needed for severe pain., Disp: 30 tablet, Rfl: 0 .  acetaminophen (TYLENOL) 500 MG tablet, Take 1,000 mg by mouth every 6 (six) hours as needed., Disp: , Rfl:  .  amoxicillin (AMOXIL) 500 MG capsule, Take 2,000 mg by mouth See admin instructions. Take 2000 mg by mouth 1 hour prior to dental appointment, Disp: , Rfl:  .  baclofen (LIORESAL) 10 MG tablet, Take 10 mg by mouth 3 (three) times daily. , Disp: , Rfl:  1 .  bisacodyl (DULCOLAX) 5 MG EC tablet, Take 5 mg by mouth daily as needed for moderate constipation., Disp: , Rfl:  .  collagenase (SANTYL) ointment, Apply 1 application topically daily., Disp: 15 g, Rfl: 0 .  Cyanocobalamin (VITAMIN B-12) 5000 MCG SUBL, Place 5,000 mcg under the tongue daily. , Disp: , Rfl:  .  diclofenac sodium (VOLTAREN) 1 % GEL, Apply 4 g topically 4 (four) times daily as needed (PAIN)., Disp: 5 Tube, Rfl: 1 .  docusate sodium (COLACE) 100 MG capsule, Take 100 mg by mouth as needed for mild constipation., Disp: , Rfl:  .  donepezil (ARICEPT) 10 MG tablet, Take 1 tablet (10 mg total) by mouth at bedtime., Disp: 90 tablet, Rfl: 3 .  doxycycline (VIBRA-TABS) 100 MG tablet, Take 1 tablet (100 mg total) by mouth 2 (two) times daily., Disp: 20 tablet, Rfl: 0 .  DULoxetine (CYMBALTA) 60 MG capsule, Take  60 mg by mouth 2 (two) times daily., Disp: , Rfl: 11 .  Erenumab-aooe (AIMOVIG) 70 MG/ML SOAJ, Inject 70 mg into the skin every 14 (fourteen) days., Disp: 2.24 mL, Rfl: 5 .  famotidine (PEPCID) 40 MG tablet, Take 0.5 tablets (20 mg total) by mouth daily., Disp: 90 tablet, Rfl: 3 .  fluconazole (DIFLUCAN) 150 MG tablet, Take 150 mg by mouth daily as needed (yeast infection). Finishes Sunday 02-07-2020 for yeast infection, Disp: , Rfl:  .  folic acid (FOLVITE) 532 MCG tablet, Take 400 mcg by mouth daily., Disp: , Rfl:  .  gabapentin (NEURONTIN) 800 MG tablet, Take 1 tablet (800 mg total) by mouth 3 (three) times daily., Disp: , Rfl:  .  IBUPROFEN PO, Take 800 mg by mouth as needed., Disp: , Rfl:  .  Krill Oil 350 MG CAPS, Take 350 mg by mouth at bedtime. , Disp: , Rfl:  .  L-Methylfolate-B12-B6-B2 (METAFOLBIC) 06-22-48-5 MG TABS, Take 1 tablet by mouth daily., Disp: 90 tablet, Rfl: 3 .  levothyroxine (SYNTHROID) 150 MCG tablet, Take 175 mcg by mouth daily before breakfast., Disp: , Rfl:  .  methylphenidate (CONCERTA) 27 MG PO CR tablet, Take 1 tablet (27 mg total) by mouth 2 (two) times daily., Disp: 60 tablet, Rfl: 0 .  mirabegron ER (MYRBETRIQ) 50 MG TB24 tablet, Take 50 mg by mouth daily., Disp: , Rfl:  .  Multiple Minerals-Vitamins (CAL-MAG-ZINC-D PO), Take 3 tablets by mouth daily. , Disp: , Rfl:  .  Multiple Vitamins-Minerals (ONE-A-DAY WOMENS PETITES PO), Take 1 tablet by mouth 2 (two) times daily. , Disp: , Rfl:  .  NARCAN 4 MG/0.1ML LIQD nasal spray kit, Place 0.4 mg into the nose once., Disp: , Rfl:  .  NUCYNTA 50 MG tablet, Take 50 mg by mouth 3 (three) times daily., Disp: , Rfl:  .  omeprazole (PRILOSEC) 40 MG capsule, Take 1 capsule (40 mg total) by mouth daily before breakfast. (Patient taking differently: Take 40 mg by mouth daily.), Disp: 90 capsule, Rfl: 3 .  ondansetron (ZOFRAN-ODT) 4 MG disintegrating tablet, Take 4 mg by mouth every 8 (eight) hours as needed for nausea or vomiting.,  Disp: , Rfl:  .  oxyCODONE-acetaminophen (PERCOCET) 10-325 MG tablet, Take 1 tablet by mouth every 4 (four) hours as needed for pain. (Patient not taking: Reported on 03/02/2020), Disp: 30 tablet, Rfl: 0 .  oxyCODONE-acetaminophen (PERCOCET) 10-325 MG tablet, Take 1 tablet by mouth every 4 (four) hours as needed for pain., Disp: 15 tablet, Rfl: 0 .  oxyCODONE-acetaminophen (PERCOCET) 10-325 MG tablet,  Take 1 tablet by mouth every 4 (four) hours as needed for pain., Disp: 30 tablet, Rfl: 0 .  oxyCODONE-acetaminophen (PERCOCET) 10-325 MG tablet, Take 1 tablet by mouth every 4 (four) hours as needed for pain., Disp: 30 tablet, Rfl: 0 .  Pseudoephedrine HCl (SUDAFED PO), Take by mouth as needed., Disp: , Rfl:  .  Rimegepant Sulfate (NURTEC) 75 MG TBDP, Take 75 mg by mouth daily as needed., Disp: 8 tablet, Rfl: 2 .  rivaroxaban (XARELTO) 20 MG TABS tablet, Take 1 tablet (20 mg total) by mouth at bedtime., Disp: , Rfl:  .  Simethicone (PHAZYME ULTRA STRENGTH) 180 MG CAPS, Take 1 capsule (180 mg total) by mouth 3 (three) times daily as needed., Disp: , Rfl: 0 .  solifenacin (VESICARE) 5 MG tablet, at bedtime. , Disp: , Rfl:  .  tiZANidine (ZANAFLEX) 4 MG tablet, Take 1 tablet (4 mg total) by mouth every 6 (six) hours as needed for muscle spasms., Disp: 30 tablet, Rfl: 0 .  topiramate (TOPAMAX) 100 MG tablet, Take 1 tablet (100 mg total) by mouth 2 (two) times daily., Disp: 180 tablet, Rfl: 3 .  traZODone (DESYREL) 100 MG tablet, Take 200 mg by mouth at bedtime., Disp: , Rfl:  .  Turmeric (QC TUMERIC COMPLEX PO), Take by mouth 2 (two) times daily., Disp: , Rfl:  .  vitamin C (ASCORBIC ACID) 500 MG tablet, Take 500 mg by mouth daily., Disp: , Rfl:  .  VITAMIN D PO, Take 5,000 Units by mouth daily. , Disp: , Rfl:   Social History   Tobacco Use  Smoking Status Former Smoker  . Packs/day: 0.75  . Years: 8.00  . Pack years: 6.00  . Types: Cigarettes  . Quit date: 12/01/1990  . Years since quitting: 29.3   Smokeless Tobacco Never Used  Tobacco Comment   quit smoking in the 1990s    Allergies  Allergen Reactions  . Lyrica [Pregabalin] Shortness Of Breath and Swelling    lower extremity edema and weight gain  . Belsomra [Suvorexant] Other (See Comments)    unknown  . Morphine And Related Itching    Upper torso  . Sulfamethoxazole-Trimethoprim Itching and Rash    Bactrim  . Tape Itching and Rash    Please use "paper" tape Rash if left on longer than 24 hrs   Objective:  There were no vitals filed for this visit. There is no height or weight on file to calculate BMI. Constitutional Well developed. Well nourished.  Vascular Foot warm and well perfused. Capillary refill normal to all digits.   Neurologic Normal speech. Oriented to person, place, and time. Epicritic sensation to light touch grossly present bilaterally.  Dermatologic  skin well coapted except for proximal superficial dehiscence without exposure of hardware.  Dehiscence is decreasing.  No clinical signs of infection noted.  Orthopedic:  Mild tenderness to palpation noted about the surgical site.   Radiographs: 3 views of skeletally mature the right foot.  No hardware loosening or breaking noted.  Good correction alignment noted.  The pins are intact.  Assessment:   1. Status post right foot surgery   2. Hallux hammertoe, right   3. Hallux valgus (acquired), right foot    Plan:  Patient was evaluated and treated and all questions answered.  S/p foot surgery right -Progressing as expected post-operatively. -XR: See above -WB Status: Partial weightbearing to the heel right lower extremity with a cam boot -Sutures: Removed and pins were removed.  No signs of  dehiscence noted.  No complication noted. -Medications: None -Superficial dehiscence noted to the proximal part of the skin incision.  No exposure of hardware noted.  Incision is measuring about 0.3 cm x 0.2 cm x 0.2 cm.  There is fibrogranular wound bed.  I  encouraged her to utilize and do Santyl wet-to-dry dressing changes.  Continue Santyl wet-to-dry dressing and switch between Betadine and Santyl. -I believe she will also benefit from physical therapy as well.  She can start to ambulate without restriction to the right lower extremity.  Physical therapy referral was sent.  No follow-ups on file.

## 2020-04-13 ENCOUNTER — Other Ambulatory Visit: Payer: Self-pay | Admitting: Podiatry

## 2020-04-13 DIAGNOSIS — Z9889 Other specified postprocedural states: Secondary | ICD-10-CM

## 2020-04-13 MED ORDER — OXYCODONE-ACETAMINOPHEN 5-325 MG PO TABS: 1.0000 | ORAL_TABLET | ORAL | 0 refills | Status: DC | PRN

## 2020-04-15 DIAGNOSIS — M797 Fibromyalgia: Secondary | ICD-10-CM | POA: Diagnosis not present

## 2020-04-15 DIAGNOSIS — Z6834 Body mass index (BMI) 34.0-34.9, adult: Secondary | ICD-10-CM | POA: Diagnosis not present

## 2020-04-15 DIAGNOSIS — E119 Type 2 diabetes mellitus without complications: Secondary | ICD-10-CM | POA: Diagnosis not present

## 2020-04-15 DIAGNOSIS — Z9884 Bariatric surgery status: Secondary | ICD-10-CM | POA: Diagnosis not present

## 2020-04-15 DIAGNOSIS — I10 Essential (primary) hypertension: Secondary | ICD-10-CM | POA: Diagnosis not present

## 2020-04-15 DIAGNOSIS — Z7901 Long term (current) use of anticoagulants: Secondary | ICD-10-CM | POA: Diagnosis not present

## 2020-04-16 ENCOUNTER — Other Ambulatory Visit: Payer: Self-pay | Admitting: Neurology

## 2020-04-16 ENCOUNTER — Other Ambulatory Visit: Payer: Self-pay | Admitting: Sports Medicine

## 2020-04-16 MED ORDER — CIPROFLOXACIN HCL 500 MG PO TABS: 500.0000 mg | ORAL_TABLET | Freq: Two times a day (BID) | ORAL | 0 refills | Status: DC

## 2020-04-16 MED ORDER — CLINDAMYCIN HCL 300 MG PO CAPS: 300.0000 mg | ORAL_CAPSULE | Freq: Three times a day (TID) | ORAL | 0 refills | Status: DC

## 2020-04-16 NOTE — Progress Notes (Signed)
Sent Clinda/Cipro on behalf of Dr. Posey Pronto for patient

## 2020-04-17 ENCOUNTER — Telehealth: Payer: Self-pay | Admitting: Sports Medicine

## 2020-04-17 NOTE — Telephone Encounter (Signed)
Spoke with pharmacist at [facility] who reports a Drug to Drug interaction with Zanaflex and Cipro. I advised to stop Zanaflex while on cipro. -Dr. Cannon Kettle

## 2020-04-18 ENCOUNTER — Ambulatory Visit: Payer: PPO | Admitting: Specialist

## 2020-04-19 ENCOUNTER — Encounter: Payer: Self-pay | Admitting: Podiatry

## 2020-04-20 ENCOUNTER — Other Ambulatory Visit: Payer: Self-pay | Admitting: Podiatry

## 2020-04-20 DIAGNOSIS — E7849 Other hyperlipidemia: Secondary | ICD-10-CM | POA: Diagnosis not present

## 2020-04-20 DIAGNOSIS — E063 Autoimmune thyroiditis: Secondary | ICD-10-CM | POA: Diagnosis not present

## 2020-04-20 DIAGNOSIS — M797 Fibromyalgia: Secondary | ICD-10-CM | POA: Diagnosis not present

## 2020-04-20 MED ORDER — OXYCODONE-ACETAMINOPHEN 5-325 MG PO TABS: 1.0000 | ORAL_TABLET | ORAL | 0 refills | Status: DC | PRN

## 2020-04-27 ENCOUNTER — Other Ambulatory Visit: Payer: Self-pay | Admitting: Podiatry

## 2020-04-27 ENCOUNTER — Other Ambulatory Visit: Payer: Self-pay

## 2020-04-27 ENCOUNTER — Ambulatory Visit (HOSPITAL_COMMUNITY): Payer: PPO | Attending: Podiatry | Admitting: Physical Therapy

## 2020-04-27 DIAGNOSIS — R2689 Other abnormalities of gait and mobility: Secondary | ICD-10-CM | POA: Diagnosis not present

## 2020-04-27 DIAGNOSIS — M79671 Pain in right foot: Secondary | ICD-10-CM | POA: Diagnosis not present

## 2020-04-27 MED ORDER — OXYCODONE-ACETAMINOPHEN 5-325 MG PO TABS: 1.0000 | ORAL_TABLET | ORAL | 0 refills | Status: DC | PRN

## 2020-04-27 NOTE — Therapy (Signed)
**Note Debbie-Identified via Obfuscation** [facility] [address] [place], [place], [address] Phone: [phone]   Fax:  [phone]  Physical Therapy Evaluation  Patient Details  Name: [name] MRN: [id number] Date of Birth: 19-Oct-1960 Referring Provider (PT): Boneta Lucks   Encounter Date: 04/27/2020   PT End of Session - 04/27/20 1409    Visit Number 1    Number of Visits 6    Date for PT Re-Evaluation 05/18/20    Authorization Type [employer]    Authorization - Visit Number 1    Authorization - Number of Visits 6    Progress Note Due on Visit 6    PT Start Time 1130    PT Stop Time 1220    PT Time Calculation (min) 50 min    Activity Tolerance Patient tolerated treatment well    Behavior During Therapy [facility] for tasks assessed/performed           Past Medical History:  Diagnosis Date  . ADD (attention deficit disorder)   . Arthritis    "all over"  . Cerebral infarction ([place]) 10/30/2011  . Cerebrovascular disease 08/14/2016  . Chronic back pain    "all over"  . Chronic low back pain 01/11/2016  . Complication of anesthesia    tends to have hypotension when NPO and post-anesthesia  . Constipation    takes stool softener daily  . Degenerative disk disease   . Degenerative joint disease   . DVT (deep venous thrombosis) ([place]) 2014   RLE  . Family history of adverse reaction to anesthesia    a family member woke up during surgery; "think it was my mom"  . Fibromyalgia   . Generalized osteoarthritis of multiple sites 11/04/2013  . GERD (gastroesophageal reflux disease)   . Heart palpitations 12/14/2013   resolved  . History of blood clots    superficial  . Hypoglycemia   . Hypothyroid    takes Synthroid daily  . Incomplete emptying of bladder   . Insomnia    takes Trazodone nightly  . Iron deficiency anemia    takes Ferrous Sulfate daily  . Joint pain   . Joint swelling    knees and ankles  . Memory disorder 08/14/2016  . Morbid obesity  ([place])   . Nausea    takes Zofran as needed.Seeing GI doc  . Neck pain 05/25/2016  . OSA on CPAP    tested more than 5 yrs ago.    . Osteoarthritis   . Osteopenia    in feet  . PFO (patent foramen ovale)    no murmur  . Pre-diabetes   . Primary osteoarthritis of both feet 05/29/2016   Right bunionectomy August 2017 by Dr. Sharol Given  . Scoliosis   . Skin abnormality 02/04/2020   raised area on lip  . Sleep apnea    mild osa per pt  . Stroke [facility]) "several"last 2014   right foot weakness; memory issues, black spot right visual field since" (03/23/2015)  . Thrombophlebitis   . Trochanteric bursitis of both hips 05/25/2016  . Unilateral primary osteoarthritis, right knee 05/29/2016  . Urinary urgency 04/19/2016  . Vein disorder 11/29/2010   varicose veins both legs  . Wears glasses     Past Surgical History:  Procedure Laterality Date  . BIOPSY  11/14/2018   Procedure: BIOPSY;  Surgeon: Rogene Houston, MD;  [facility];  Service: Endoscopy;;  esophagusgastric  . BONE EXCISION Right 08/29/2017   Procedure: right  trapezium excision;  Surgeon: Daryll Brod, MD;  Location: [place];  Service: Orthopedics;  Laterality: Right;  . BUNIONECTOMY Right 08/2015  . CARDIAC CATHETERIZATION     2008.  "it was fine" (not sure why she had it done, and doesn't know where)  . CARPOMETACARPEL SUSPENSION PLASTY Right 08/29/2017   Procedure: SUSPENSION PLASTY RIGHT THUMB;  Surgeon: Daryll Brod, MD;  Location: [place];  Service: Orthopedics;  Laterality: Right;  . COLONOSCOPY N/A 03/25/2013   Procedure: COLONOSCOPY;  Surgeon: Rogene Houston, MD;  [facility];  Service: Endoscopy;  Laterality: N/A;  930  . ESOPHAGOGASTRODUODENOSCOPY    . ESOPHAGOGASTRODUODENOSCOPY (EGD) WITH PROPOFOL N/A 11/14/2018   Procedure: ESOPHAGOGASTRODUODENOSCOPY (EGD) WITH PROPOFOL;  Surgeon: Rogene Houston, MD;  [facility];  Service: Endoscopy;  Laterality: N/A;   1:55pm-office moved to 11:00am/pt notified to arrive at 9:30am per KF  . EXPLORATORY LAPAROTOMY     "took fallopian tubes out"  . HALLUX FUSION Right 02/12/2020   Procedure: HALLUX INTERPHANGEAL JOINT  FUSION;  Surgeon: Felipa Furnace, DPM;  Location: [place];  Service: Podiatry;  Laterality: Right;  . HAMMER TOE SURGERY Right 02/12/2020   Procedure: SECOND AND THIRD HAMMER TOE CORRECTION; CAPSULOTOMY SECOND INTERPHALANGEAL JOINT;  Surgeon: Felipa Furnace, DPM;  Location: [place];  Service: Podiatry;  Laterality: Right;  . JOINT REPLACEMENT     bil knee   . KNEE ARTHROSCOPY Left   . KNEE ARTHROSCOPY W/ ACL RECONSTRUCTION Right yrs ago   "added pins"  . LAPAROSCOPIC CHOLECYSTECTOMY  ~ 2001  . ROUX-EN-Y GASTRIC BYPASS  11/20/2010   [place] [facility] [place]  . SPINAL CORD STIMULATOR INSERTION N/A 04/18/2017   Procedure: LUMBAR SPINAL CORD STIMULATOR INSERTION;  Surgeon: Clydell Hakim, MD;  Location: [place];  Service: Neurosurgery;  Laterality: N/A;  LUMBAR SPINAL CORD STIMULATOR INSERTION  . TENDON TRANSFER Right 08/29/2017   Procedure: right abductor pollicis longus transfer;  Surgeon: Daryll Brod, MD;  Location: [place];  Service: Orthopedics;  Laterality: Right;  . TOTAL KNEE ARTHROPLASTY Left 03/23/2015   Procedure: TOTAL KNEE ARTHROPLASTY;  Surgeon: Newt Minion, MD;  Location: [place];  Service: Orthopedics;  Laterality: Left;  . TOTAL KNEE ARTHROPLASTY Right 08/15/2016   Procedure: RIGHT TOTAL KNEE ARTHROPLASTY, REMOVAL ACL SCREWS;  Surgeon: Newt Minion, MD;  Location: [place];  Service: Orthopedics;  Laterality: Right;  . TOTAL KNEE ARTHROPLASTY WITH HARDWARE REMOVAL Right   . VAGINAL HYSTERECTOMY     tah/bso  . VARICOSE VEIN SURGERY Right X 2  . WEIL OSTEOTOMY Right 02/12/2020   Procedure: DOUBLE L OSTEOTOMY;  Surgeon: Felipa Furnace, DPM;  Location: [place];  Service: Podiatry;  Laterality: Right;    There were no vitals filed for  this visit.    Subjective Assessment - 04/27/20 1124    Subjective Pt is a 60 yo female who had chronic foot pain and opted to have surgery on 02/12/2020.  She had Right first tarsometatarsal joint fusion #2 right second and third metatarsophalangeal joint capsulotomy #3 right second and third digit hammertoe fusion.   She comes to the department with an order for evaluation and treatment.  She states that since the surgery she has had to be on 3 bouts of  antibiotics.  She complains of swellling and when she is having swelling she has a lot of pain.  She has compression stockings but are not using then  on a regular basis.  When she has her leg down her whole foot throbs and the throbbing goes up to several inches above her ankle.  She is currently using a cane to walk with but this is normal due to her back pain.  Her main concern at this time is the swelling because when her foot swells her pain increases.    Pertinent History fibromyalgia, back pain, B TKR,    Limitations Sitting;Standing;Walking;House hold activities    How long can you sit comfortably? PT wants to bring RT foot onto Lt thigh after five minutes.    How long can you stand comfortably? Able to stand for about 5 mintues    How long can you walk comfortably? 15 minutes with a cane but she pays for it when she gets home and takes pain medication before    Patient Stated Goals less swellin, less pain sit, stand and walk longer.    Currently in Pain? Yes    Pain Score 5    worst:  8/10; best 2/10   Pain Location Foot    Pain Orientation Right    Pain Type Acute pain;Surgical pain    Pain Radiating Towards to above ankle    Pain Onset More than a month ago    Pain Frequency Constant    Aggravating Factors  dependent position    Pain Relieving Factors elevation    Effect of Pain on Daily Activities limits              [facility] PT Assessment - 04/27/20 0001      Assessment   Medical Diagnosis s/p surgical repair of bunion and  hammer toes.    Referring Provider (PT) Boneta Lucks    Onset Date/Surgical Date 02/12/20    Next MD Visit 05/18/2020    Prior Therapy none for this episode      Precautions   Precautions None      Restrictions   Weight Bearing Restrictions No      Balance Screen   Has the patient fallen in the past 6 months Yes    How many times? 1    Has the patient had a decrease in activity level because of a fear of falling?  Yes    Is the patient reluctant to leave their home because of a fear of falling?  No      Home Ecologist residence      Prior Function   Level of Independence Independent with community mobility with device    Vocation On disability    Leisure working in garden      Cognition   Overall Cognitive Status Within Functional Limits for tasks assessed      Observation/Other Assessments   Focus on Therapeutic Outcomes (FOTO)  47, 53% affected      Observation/Other Assessments-Edema    Edema Circumferential      Circumferential Edema   Circumferential - Right heel: 32.3; 5cm from MTP 24.1, MTP 23.5    Circumferential - Left  heel: 31.5, 22.5; MTP 22      ROM / Strength   AROM / PROM / Strength AROM;Strength      AROM   AROM Assessment Site Ankle    Right/Left Ankle Right;Left    Right Ankle Dorsiflexion 15    Right Ankle Plantar Flexion 60    Right Ankle Inversion 35    Right Ankle Eversion 15    Left Ankle Dorsiflexion 15  Left Ankle Plantar Flexion 70    Left Ankle Inversion 30    Left Ankle Eversion 25      Strength   Overall Strength Comments hallicus longus RT 2+; LT 5 ;flexor Rt 4/5 Lt 5/5    Strength Assessment Site Ankle    Right/Left Ankle --   RT 4/5 ; Lt 5/5 for all general directions     Palpation   Palpation comment noted scar tissue under incisions      Ambulation/Gait   Ambulation Distance (Feet) 232 Feet    Assistive device Straight cane    Gait Comments 2 minute period of time.                  toe curl / toe abduction x 10 Isometric Rt great toe extension x 10      Objective measurements completed on examination: See above findings.               PT Education - 04/27/20 1408    Education Details HEP; the importance of wearing her compression garment.    Person(s) Educated Patient    Methods Explanation;Verbal cues;Handout    Comprehension Verbalized understanding            PT Short Term Goals - 04/27/20 1420      PT SHORT TERM GOAL #1   Title PT to be completing her HEP to allow her pain in her foot to decrease to no more than a 6/10 to allow pt to be able to stand for 15 minute to complete a small meal.    Time 10    Period Days    Status New    Target Date 05/06/20      PT SHORT TERM GOAL #2   Title Pt to be wearing her compresion garment.  to be able to sit for 15 minutes without having increased Rt foot pain.    Time 10    Period Days    Status New             PT Long Term Goals - 04/27/20 1423      PT LONG TERM GOAL #1   Title PT to be completing and advance HEP to allow pt pain in her RT foot to be no greateter than a 3/10 to allow her to be able to stand for 20 minute to prepare a meal.    Time 3    Period Weeks    Status New    Target Date 05/18/20      PT LONG TERM GOAL #2   Title Pt to be able to sit for over 30 minutes to be able to watch TV, travel to [place] in comfort for appointments.    Time 3    Period Weeks    Status New    Target Date 05/18/20      PT LONG TERM GOAL #3   Title Pt to be able to walk for 45 minutes or longer, with a cane,  without having increased Right foot pain.  to be able to complete her shopping in comfort    Period Weeks    Status New                  Plan - 04/27/20 1413    Clinical Impression Statement Debbie Pineda is a 60 yo female who had surgery  on her 1st-3rd MTP on 02/12/2020.  She has had significant pain and swelling with the surgery and has  been referred  to skilled PT.  The pt has a compression garment but has not donned it due to it being to tight.  The therapist explained that the garment will significantly assist in reducing the swelling which will reduce the pain.  Evaluation demonstrated decreased ROM, decreased strength, decreased scar mobility, increased edema and pain.  Debbie Pineda will benefit from skilled PT to address these issues and improve her functional ability.    Personal Factors and Comorbidities Comorbidity 1;Comorbidity 2    Comorbidities LBP, OA    Examination-Activity Limitations Locomotion Level;Stand;Stairs;Squat;Sit    Examination-Participation Restrictions Cleaning;Community Activity;Yard Work    Stability/Clinical Decision Making Stable/Uncomplicated    Clinical Decision Making Moderate    Rehab Potential Good    PT Frequency 2x / week    PT Duration 3 weeks    PT Treatment/Interventions Patient/family education;Manual techniques;Therapeutic exercise;Balance training;Therapeutic activities    PT Next Visit Plan begin manual to decrease foot swelling, toe crunch , marble pick up, tandem stance, heel raises, toe raises    PT Home Exercise Plan toe curlijng, toe adduction, isometric great toe extension.    Consulted and Agree with Plan of Care Patient           Patient will benefit from skilled therapeutic intervention in order to improve the following deficits and impairments:  Decreased activity tolerance,Decreased balance,Difficulty walking,Decreased range of motion,Decreased strength,Decreased scar mobility,Increased edema,Pain  Visit Diagnosis: Other abnormalities of gait and mobility  Pain in right foot     Problem List Patient Active Problem List   Diagnosis Date Noted  . LFTs abnormal 03/02/2020  . 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 GERD (gastroesophageal reflux disease) 09/22/2019  . SSBE (short-segment Barrett's esophagus) 09/22/2019  . Flatulence  09/22/2019  . Migraine 11/11/2018  . Abdominal pain, epigastric 10/14/2018  . LUQ pain 10/14/2018  . Attention deficit hyperactivity disorder (ADHD) 01/12/2018  . Insomnia 01/12/2018  . Elevated liver enzymes 09/10/2017  . History of diabetes mellitus 06/06/2017  . Primary osteoarthritis of right hand 06/06/2017  . Transaminasemia   . TIA (transient ischemic attack) 05/01/2017  . Dysphasia 05/01/2017  . Chronic pain syndrome 05/01/2017  . Confusion   . Presence of right artificial knee joint 09/17/2016  . H/O total knee replacement, right 08/15/2016  . Presence of retained hardware   . Memory disorder 08/14/2016  . Cerebrovascular disease 08/14/2016  . Unilateral primary osteoarthritis, right knee 05/29/2016  . Primary osteoarthritis of both feet 05/29/2016  . Trochanteric bursitis of both hips 05/25/2016  . Neck pain 05/25/2016  . Urinary urgency 04/19/2016  . Chronic low back pain 01/11/2016  . Depression 11/29/2015  . 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 Heart palpitations 12/14/2013  . Generalized osteoarthritis of multiple sites 11/04/2013  . Cerebral infarction ([place]) 10/30/2011  . PFO (patent foramen ovale) 10/30/2011  . Bradycardia 10/30/2011  . Chest pain 10/30/2011  . Hypothyroid   . Thrombophlebitis   . Sleep apnea   . Fibromyalgia   . Status post bariatric surgery 12/07/2010  . Vein disorder 11/29/2010  . S/P total hysterectomy and bilateral salpingo-oophorectomy 11/29/2010  . S/P exploratory laparotomy 11/29/2010  . S/P cholecystectomy 11/29/2010  . S/P ACL surgery 11/29/2010  . Morbid obesity [facility]) 11/10/2010   Rayetta Humphrey, PT CLT [phone] 04/27/2020, 2:28 PM  [facility] [address] [place], [place], [address] Phone: [phone]   Fax:  [phone]  Name: Debbie Pineda MRN: [id number] Date of Birth: [birth year]

## 2020-04-27 NOTE — Addendum Note (Signed)
Addended by: Leeroy Cha on: 04/27/2020 02:33 PM   Modules accepted: Orders

## 2020-04-28 DIAGNOSIS — R932 Abnormal findings on diagnostic imaging of liver and biliary tract: Secondary | ICD-10-CM | POA: Diagnosis not present

## 2020-04-28 DIAGNOSIS — F418 Other specified anxiety disorders: Secondary | ICD-10-CM | POA: Diagnosis not present

## 2020-04-28 DIAGNOSIS — Z4659 Encounter for fitting and adjustment of other gastrointestinal appliance and device: Secondary | ICD-10-CM | POA: Diagnosis not present

## 2020-04-28 DIAGNOSIS — Z8673 Personal history of transient ischemic attack (TIA), and cerebral infarction without residual deficits: Secondary | ICD-10-CM | POA: Diagnosis not present

## 2020-04-28 DIAGNOSIS — K838 Other specified diseases of biliary tract: Secondary | ICD-10-CM | POA: Diagnosis not present

## 2020-04-28 DIAGNOSIS — Z79899 Other long term (current) drug therapy: Secondary | ICD-10-CM | POA: Diagnosis not present

## 2020-04-28 DIAGNOSIS — F988 Other specified behavioral and emotional disorders with onset usually occurring in childhood and adolescence: Secondary | ICD-10-CM | POA: Diagnosis not present

## 2020-04-28 DIAGNOSIS — R748 Abnormal levels of other serum enzymes: Secondary | ICD-10-CM | POA: Diagnosis not present

## 2020-04-28 DIAGNOSIS — E559 Vitamin D deficiency, unspecified: Secondary | ICD-10-CM | POA: Diagnosis not present

## 2020-04-28 DIAGNOSIS — E039 Hypothyroidism, unspecified: Secondary | ICD-10-CM | POA: Diagnosis not present

## 2020-04-28 DIAGNOSIS — Z90710 Acquired absence of both cervix and uterus: Secondary | ICD-10-CM | POA: Diagnosis not present

## 2020-04-28 DIAGNOSIS — R935 Abnormal findings on diagnostic imaging of other abdominal regions, including retroperitoneum: Secondary | ICD-10-CM | POA: Diagnosis not present

## 2020-04-28 DIAGNOSIS — M797 Fibromyalgia: Secondary | ICD-10-CM | POA: Diagnosis not present

## 2020-04-28 DIAGNOSIS — F341 Dysthymic disorder: Secondary | ICD-10-CM | POA: Diagnosis not present

## 2020-04-28 DIAGNOSIS — Z9884 Bariatric surgery status: Secondary | ICD-10-CM | POA: Diagnosis not present

## 2020-04-28 DIAGNOSIS — G4700 Insomnia, unspecified: Secondary | ICD-10-CM | POA: Diagnosis not present

## 2020-04-28 DIAGNOSIS — K219 Gastro-esophageal reflux disease without esophagitis: Secondary | ICD-10-CM | POA: Diagnosis not present

## 2020-04-28 DIAGNOSIS — Z9049 Acquired absence of other specified parts of digestive tract: Secondary | ICD-10-CM | POA: Diagnosis not present

## 2020-04-28 HISTORY — PX: OTHER SURGICAL HISTORY: SHX169

## 2020-04-29 ENCOUNTER — Encounter (HOSPITAL_COMMUNITY): Payer: Self-pay | Admitting: Physical Therapy

## 2020-04-29 ENCOUNTER — Ambulatory Visit (HOSPITAL_COMMUNITY): Payer: PPO | Admitting: Physical Therapy

## 2020-04-29 ENCOUNTER — Other Ambulatory Visit: Payer: Self-pay

## 2020-04-29 DIAGNOSIS — R2689 Other abnormalities of gait and mobility: Secondary | ICD-10-CM | POA: Diagnosis not present

## 2020-04-29 DIAGNOSIS — M79671 Pain in right foot: Secondary | ICD-10-CM

## 2020-04-29 NOTE — Patient Instructions (Signed)
Access Code: [email or web address] URL: [email or web address] Date: 04/29/2020 Prepared by: Yetta Glassman  Exercises Towel Scrunches - 4 reps - 60 hold Seated Toe Flexion Extension PROM - 10 reps - 10 hold Seated Self Great Toe Stretch - 10 reps - 10 hold Seated Heel Raise - 20 reps seated foot raise - 20 reps

## 2020-04-29 NOTE — Therapy (Signed)
[place], [place], [address] Phone: [phone]   Fax:  [phone]  Physical Therapy Treatment  Patient Details  Name: Debbie Pineda MRN: [id number] Date of Birth: 1960-11-15 Referring Provider (PT): Boneta Lucks   Encounter Date: 04/29/2020   PT End of Session - 04/29/20 1146    Visit Number 2    Number of Visits 6    Date for PT Re-Evaluation 05/18/20    Authorization Type [employer]    Authorization - Visit Number 2    Authorization - Number of Visits 6    Progress Note Due on Visit 6    PT Start Time 1146   pt 14 minutes late to session   PT Stop Time 1210    PT Time Calculation (min) 24 min    Activity Tolerance Patient tolerated treatment well    Behavior During Therapy [facility] for tasks assessed/performed           Past Medical History:  Diagnosis Date  . ADD (attention deficit disorder)   . Arthritis    "all over"  . Cerebral infarction ([place]) 10/30/2011  . Cerebrovascular disease 08/14/2016  . Chronic back pain    "all over"  . Chronic low back pain 01/11/2016  . Complication of anesthesia    tends to have hypotension when NPO and post-anesthesia  . Constipation    takes stool softener daily  . Degenerative disk disease   . Degenerative joint disease   . DVT (deep venous thrombosis) ([place]) 2014   RLE  . Family history of adverse reaction to anesthesia    a family member woke up during surgery; "think it was my mom"  . Fibromyalgia   . Generalized osteoarthritis of multiple sites 11/04/2013  . GERD (gastroesophageal reflux disease)   . Heart palpitations 12/14/2013   resolved  . History of blood clots    superficial  . Hypoglycemia   . Hypothyroid    takes Synthroid daily  . Incomplete emptying of bladder   . Insomnia    takes Trazodone nightly  . Iron deficiency anemia    takes Ferrous Sulfate daily  . Joint pain   . Joint swelling    knees and ankles  . Memory  disorder 08/14/2016  . Morbid obesity ([place])   . Nausea    takes Zofran as needed.Seeing GI doc  . Neck pain 05/25/2016  . OSA on CPAP    tested more than 5 yrs ago.    . Osteoarthritis   . Osteopenia    in feet  . PFO (patent foramen ovale)    no murmur  . Pre-diabetes   . Primary osteoarthritis of both feet 05/29/2016   Right bunionectomy August 2017 by Dr. Sharol Given  . Scoliosis   . Skin abnormality 02/04/2020   raised area on lip  . Sleep apnea    mild osa per pt  . Stroke [facility]) "several"last 2014   right foot weakness; memory issues, black spot right visual field since" (03/23/2015)  . Thrombophlebitis   . Trochanteric bursitis of both hips 05/25/2016  . Unilateral primary osteoarthritis, right knee 05/29/2016  . Urinary urgency 04/19/2016  . Vein disorder 11/29/2010   varicose veins both legs  . Wears glasses     Past Surgical History:  Procedure Laterality Date  . BIOPSY  11/14/2018   Procedure: BIOPSY;  Surgeon: Rogene Houston, MD;  [facility];  Service: Endoscopy;;  esophagusgastric  . BONE  EXCISION Right 08/29/2017   Procedure: right trapezium excision;  Surgeon: Daryll Brod, MD;  Location: [place];  Service: Orthopedics;  Laterality: Right;  . BUNIONECTOMY Right 08/2015  . CARDIAC CATHETERIZATION     2008.  "it was fine" (not sure why she had it done, and doesn't know where)  . CARPOMETACARPEL SUSPENSION PLASTY Right 08/29/2017   Procedure: SUSPENSION PLASTY RIGHT THUMB;  Surgeon: Daryll Brod, MD;  Location: [place];  Service: Orthopedics;  Laterality: Right;  . COLONOSCOPY N/A 03/25/2013   Procedure: COLONOSCOPY;  Surgeon: Rogene Houston, MD;  [facility];  Service: Endoscopy;  Laterality: N/A;  930  . ESOPHAGOGASTRODUODENOSCOPY    . ESOPHAGOGASTRODUODENOSCOPY (EGD) WITH PROPOFOL N/A 11/14/2018   Procedure: ESOPHAGOGASTRODUODENOSCOPY (EGD) WITH PROPOFOL;  Surgeon: Rogene Houston, MD;  [facility];   Service: Endoscopy;  Laterality: N/A;  1:55pm-office moved to 11:00am/pt notified to arrive at 9:30am per KF  . EXPLORATORY LAPAROTOMY     "took fallopian tubes out"  . HALLUX FUSION Right 02/12/2020   Procedure: HALLUX INTERPHANGEAL JOINT  FUSION;  Surgeon: Felipa Furnace, DPM;  Location: [place];  Service: Podiatry;  Laterality: Right;  . HAMMER TOE SURGERY Right 02/12/2020   Procedure: SECOND AND THIRD HAMMER TOE CORRECTION; CAPSULOTOMY SECOND INTERPHALANGEAL JOINT;  Surgeon: Felipa Furnace, DPM;  Location: [place];  Service: Podiatry;  Laterality: Right;  . JOINT REPLACEMENT     bil knee   . KNEE ARTHROSCOPY Left   . KNEE ARTHROSCOPY W/ ACL RECONSTRUCTION Right yrs ago   "added pins"  . LAPAROSCOPIC CHOLECYSTECTOMY  ~ 2001  . ROUX-EN-Y GASTRIC BYPASS  11/20/2010   [place]  . SPINAL CORD STIMULATOR INSERTION N/A 04/18/2017   Procedure: LUMBAR SPINAL CORD STIMULATOR INSERTION;  Surgeon: Clydell Hakim, MD;  Location: [place];  Service: Neurosurgery;  Laterality: N/A;  LUMBAR SPINAL CORD STIMULATOR INSERTION  . TENDON TRANSFER Right 08/29/2017   Procedure: right abductor pollicis longus transfer;  Surgeon: Daryll Brod, MD;  Location: [place];  Service: Orthopedics;  Laterality: Right;  . TOTAL KNEE ARTHROPLASTY Left 03/23/2015   Procedure: TOTAL KNEE ARTHROPLASTY;  Surgeon: Newt Minion, MD;  Location: [place];  Service: Orthopedics;  Laterality: Left;  . TOTAL KNEE ARTHROPLASTY Right 08/15/2016   Procedure: RIGHT TOTAL KNEE ARTHROPLASTY, REMOVAL ACL SCREWS;  Surgeon: Newt Minion, MD;  Location: [place];  Service: Orthopedics;  Laterality: Right;  . TOTAL KNEE ARTHROPLASTY WITH HARDWARE REMOVAL Right   . VAGINAL HYSTERECTOMY     tah/bso  . VARICOSE VEIN SURGERY Right X 2  . WEIL OSTEOTOMY Right 02/12/2020   Procedure: DOUBLE L OSTEOTOMY;  Surgeon: Felipa Furnace, DPM;  Location: [place];  Service: Podiatry;  Laterality:  Right;    There were no vitals filed for this visit.   Subjective Assessment - 04/29/20 1151    Subjective Patient reports she has been wearing her compression garment and she didn't wear it yesterday as she had intra abdominal surgery. States that her pain is currently 5/10 with walking while on pain medication. Described as sharp. At rest 2/10    Pertinent History fibromyalgia, back pain, B TKR,    Limitations Sitting;Standing;Walking;House hold activities    How long can you sit comfortably? PT wants to bring RT foot onto Lt thigh after five minutes.    How long can you stand comfortably? Able to stand for about 5 mintues  How long can you walk comfortably? 15 minutes with a cane but she pays for it when she gets home and takes pain medication before    Patient Stated Goals less swellin, less pain sit, stand and walk longer.    Currently in Pain? Yes    Pain Score 2     Pain Location Foot    Pain Orientation Right    Pain Descriptors / Indicators Sharp    Pain Onset More than a month ago              [facility] PT Assessment - 04/29/20 0001      Assessment   Medical Diagnosis s/p surgical repair of bunion and hammer toes.    Referring Provider (PT) Boneta Lucks                         [facility] Adult PT Treatment/Exercise - 04/29/20 0001      Ankle Exercises: Seated   Towel Crunch 5 reps   60 seconds holds   Heel Raises Right;20 reps;5 seconds   focus on keeping great toe down on floor   Toe Raise 20 reps;5 seconds    Other Seated Ankle Exercises self massage/mobilizaiton to right foot - total of 5 minutes    Other Seated Ankle Exercises PROM of right foot - Patient performed with PT guidance - 5 minutes                    PT Short Term Goals - 04/27/20 1420      PT SHORT TERM GOAL #1   Title PT to be completing her HEP to allow her pain in her foot to decrease to no more than a 6/10 to allow pt to be able to stand for 15 minute to complete a small  meal.    Time 10    Period Days    Status New    [place] Date 05/06/20      PT SHORT TERM GOAL #2   Title Pt to be wearing her compresion garment.  to be able to sit for 15 minutes without having increased Rt foot pain.    Time 10    Period Days    Status New             PT Long Term Goals - 04/27/20 1423      PT LONG TERM GOAL #1   Title PT to be completing and advance HEP to allow pt pain in her RT foot to be no greateter than a 3/10 to allow her to be able to stand for 20 minute to prepare a meal.    Time 3    Period Weeks    Status New    Target Date 05/18/20      PT LONG TERM GOAL #2   Title Pt to be able to sit for over 30 minutes to be able to watch TV, travel to [place] in comfort for appointments.    Time 3    Period Weeks    Status New    Target Date 05/18/20      PT LONG TERM GOAL #3   Title Pt to be able to walk for 45 minutes or longer, with a cane,  without having increased Right foot pain.  to be able to complete her shopping in comfort    Period Weeks    Status New  Plan - 04/29/20 1146    Clinical Impression Statement Session limited secondary to late arrival. Added towel scrunches and PROM to HEP. This was tolerated well once patient did not performs max isometric with towel scrunches. Added new exercises to HEP with print off. Fatigue in foot noted end of session.    Personal Factors and Comorbidities Comorbidity 1;Comorbidity 2    Comorbidities LBP, OA    Examination-Activity Limitations Locomotion Level;Stand;Stairs;Squat;Sit    Examination-Participation Restrictions Cleaning;Community Activity;Yard Work    Stability/Clinical Decision Making Stable/Uncomplicated    Rehab Potential Good    PT Frequency 2x / week    PT Duration 3 weeks    PT Treatment/Interventions Patient/family education;Manual techniques;Therapeutic exercise;Balance training;Therapeutic activities    PT Next Visit Plan begin manual to decrease foot  swelling, marble pick up, tandem stance, heel raises, toe raises    PT Home Exercise Plan toe curlijng, toe adduction, isometric great toe extension.; 4/8 toe PROM, towel scrunches, seated DF/PF    Consulted and Agree with Plan of Care Patient           Patient will benefit from skilled therapeutic intervention in order to improve the following deficits and impairments:  Decreased activity tolerance,Decreased balance,Difficulty walking,Decreased range of motion,Decreased strength,Decreased scar mobility,Increased edema,Pain  Visit Diagnosis: Other abnormalities of gait and mobility  Pain in right foot     Problem List Patient Active Problem List   Diagnosis Date Noted  . LFTs abnormal 03/02/2020  . 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 GERD (gastroesophageal reflux disease) 09/22/2019  . SSBE (short-segment Barrett's esophagus) 09/22/2019  . Flatulence 09/22/2019  . Migraine 11/11/2018  . Abdominal pain, epigastric 10/14/2018  . LUQ pain 10/14/2018  . Attention deficit hyperactivity disorder (ADHD) 01/12/2018  . Insomnia 01/12/2018  . Elevated liver enzymes 09/10/2017  . History of diabetes mellitus 06/06/2017  . Primary osteoarthritis of right hand 06/06/2017  . Transaminasemia   . TIA (transient ischemic attack) 05/01/2017  . Dysphasia 05/01/2017  . Chronic pain syndrome 05/01/2017  . Confusion   . Presence of right artificial knee joint 09/17/2016  . H/O total knee replacement, right 08/15/2016  . Presence of retained hardware   . Memory disorder 08/14/2016  . Cerebrovascular disease 08/14/2016  . Unilateral primary osteoarthritis, right knee 05/29/2016  . Primary osteoarthritis of both feet 05/29/2016  . Trochanteric bursitis of both hips 05/25/2016  . Neck pain 05/25/2016  . Urinary urgency 04/19/2016  . Chronic low back pain 01/11/2016  . Depression 11/29/2015  . 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 Heart  palpitations 12/14/2013  . Generalized osteoarthritis of multiple sites 11/04/2013  . Cerebral infarction ([place]) 10/30/2011  . PFO (patent foramen ovale) 10/30/2011  . Bradycardia 10/30/2011  . Chest pain 10/30/2011  . Hypothyroid   . Thrombophlebitis   . Sleep apnea   . Fibromyalgia   . Status post bariatric surgery 12/07/2010  . Vein disorder 11/29/2010  . S/P total hysterectomy and bilateral salpingo-oophorectomy 11/29/2010  . S/P exploratory laparotomy 11/29/2010  . S/P cholecystectomy 11/29/2010  . S/P ACL surgery 11/29/2010  . Morbid obesity ([place]) 11/10/2010   12:10 PM, 04/29/20 Jerene Pitch, DPT Physical Therapy with [facility] [place]  [phone] office  [place] [address] [place], [place], [address] Phone: [phone]   Fax:  [phone]  Name: Debbie Pineda MRN: [id number] Date of Birth: 1960/05/15

## 2020-05-02 ENCOUNTER — Other Ambulatory Visit: Payer: Self-pay

## 2020-05-02 ENCOUNTER — Ambulatory Visit (HOSPITAL_COMMUNITY): Payer: PPO | Admitting: Physical Therapy

## 2020-05-02 DIAGNOSIS — M79671 Pain in right foot: Secondary | ICD-10-CM

## 2020-05-02 DIAGNOSIS — R2689 Other abnormalities of gait and mobility: Secondary | ICD-10-CM

## 2020-05-02 NOTE — Therapy (Signed)
[employer] [place], [place], [address] Phone: [phone]   Fax:  [phone]  Physical Therapy Treatment  Patient Details  Name: Debbie Pineda MRN: [id number] Date of Birth: [birth year] Referring Provider (PT): Boneta Lucks   Encounter Date: 05/02/2020   PT End of Session - 05/02/20 1142    Visit Number 3    Number of Visits 6    Date for PT Re-Evaluation 05/18/20    Authorization Type [employer]    Authorization - Visit Number 3    Authorization - Number of Visits 6    Progress Note Due on Visit 6    PT Start Time 1133    PT Stop Time 1211    PT Time Calculation (min) 38 min    Activity Tolerance Patient tolerated treatment well    Behavior During Therapy [facility] for tasks assessed/performed           Past Medical History:  Diagnosis Date  . ADD (attention deficit disorder)   . Arthritis    "all over"  . Cerebral infarction ([place]) 10/30/2011  . Cerebrovascular disease 08/14/2016  . Chronic back pain    "all over"  . Chronic low back pain 01/11/2016  . Complication of anesthesia    tends to have hypotension when NPO and post-anesthesia  . Constipation    takes stool softener daily  . Degenerative disk disease   . Degenerative joint disease   . DVT (deep venous thrombosis) ([place]) 2014   RLE  . Family history of adverse reaction to anesthesia    a family member woke up during surgery; "think it was my mom"  . Fibromyalgia   . Generalized osteoarthritis of multiple sites 11/04/2013  . GERD (gastroesophageal reflux disease)   . Heart palpitations 12/14/2013   resolved  . History of blood clots    superficial  . Hypoglycemia   . Hypothyroid    takes Synthroid daily  . Incomplete emptying of bladder   . Insomnia    takes Trazodone nightly  . Iron deficiency anemia    takes Ferrous Sulfate daily  . Joint pain   . Joint swelling    knees and ankles  . Memory disorder 08/14/2016  . Morbid obesity  ([place])   . Nausea    takes Zofran as needed.Seeing GI doc  . Neck pain 05/25/2016  . OSA on CPAP    tested more than 5 yrs ago.    . Osteoarthritis   . Osteopenia    in feet  . PFO (patent foramen ovale)    no murmur  . Pre-diabetes   . Primary osteoarthritis of both feet 05/29/2016   Right bunionectomy August 2017 by Dr. Sharol Given  . Scoliosis   . Skin abnormality 02/04/2020   raised area on lip  . Sleep apnea    mild osa per pt  . Stroke [facility]) "several"last 2014   right foot weakness; memory issues, black spot right visual field since" (03/23/2015)  . Thrombophlebitis   . Trochanteric bursitis of both hips 05/25/2016  . Unilateral primary osteoarthritis, right knee 05/29/2016  . Urinary urgency 04/19/2016  . Vein disorder 11/29/2010   varicose veins both legs  . Wears glasses     Past Surgical History:  Procedure Laterality Date  . BIOPSY  11/14/2018   Procedure: BIOPSY;  Surgeon: Rogene Houston, MD;  [facility];  Service: Endoscopy;;  esophagusgastric  . BONE EXCISION Right 08/29/2017   Procedure: right  trapezium excision;  Surgeon: Daryll Brod, MD;  Location: [place];  Service: Orthopedics;  Laterality: Right;  . BUNIONECTOMY Right 08/2015  . CARDIAC CATHETERIZATION     2008.  "it was fine" (not sure why she had it done, and doesn't know where)  . CARPOMETACARPEL SUSPENSION PLASTY Right 08/29/2017   Procedure: SUSPENSION PLASTY RIGHT THUMB;  Surgeon: Daryll Brod, MD;  Location: [place];  Service: Orthopedics;  Laterality: Right;  . COLONOSCOPY N/A 03/25/2013   Procedure: COLONOSCOPY;  Surgeon: Rogene Houston, MD;  [facility];  Service: Endoscopy;  Laterality: N/A;  930  . ESOPHAGOGASTRODUODENOSCOPY    . ESOPHAGOGASTRODUODENOSCOPY (EGD) WITH PROPOFOL N/A 11/14/2018   Procedure: ESOPHAGOGASTRODUODENOSCOPY (EGD) WITH PROPOFOL;  Surgeon: Rogene Houston, MD;  [facility];  Service: Endoscopy;  Laterality: N/A;   1:55pm-office moved to 11:00am/pt notified to arrive at 9:30am per KF  . EXPLORATORY LAPAROTOMY     "took fallopian tubes out"  . HALLUX FUSION Right 02/12/2020   Procedure: HALLUX INTERPHANGEAL JOINT  FUSION;  Surgeon: Felipa Furnace, DPM;  Location: [place];  Service: Podiatry;  Laterality: Right;  . HAMMER TOE SURGERY Right 02/12/2020   Procedure: SECOND AND THIRD HAMMER TOE CORRECTION; CAPSULOTOMY SECOND INTERPHALANGEAL JOINT;  Surgeon: Felipa Furnace, DPM;  Location: [place];  Service: Podiatry;  Laterality: Right;  . JOINT REPLACEMENT     bil knee   . KNEE ARTHROSCOPY Left   . KNEE ARTHROSCOPY W/ ACL RECONSTRUCTION Right yrs ago   "added pins"  . LAPAROSCOPIC CHOLECYSTECTOMY  ~ 2001  . ROUX-EN-Y GASTRIC BYPASS  11/20/2010   [place]  . SPINAL CORD STIMULATOR INSERTION N/A 04/18/2017   Procedure: LUMBAR SPINAL CORD STIMULATOR INSERTION;  Surgeon: Clydell Hakim, MD;  Location: [place];  Service: Neurosurgery;  Laterality: N/A;  LUMBAR SPINAL CORD STIMULATOR INSERTION  . TENDON TRANSFER Right 08/29/2017   Procedure: right abductor pollicis longus transfer;  Surgeon: Daryll Brod, MD;  Location: [place];  Service: Orthopedics;  Laterality: Right;  . TOTAL KNEE ARTHROPLASTY Left 03/23/2015   Procedure: TOTAL KNEE ARTHROPLASTY;  Surgeon: Newt Minion, MD;  Location: [place];  Service: Orthopedics;  Laterality: Left;  . TOTAL KNEE ARTHROPLASTY Right 08/15/2016   Procedure: RIGHT TOTAL KNEE ARTHROPLASTY, REMOVAL ACL SCREWS;  Surgeon: Newt Minion, MD;  Location: [place];  Service: Orthopedics;  Laterality: Right;  . TOTAL KNEE ARTHROPLASTY WITH HARDWARE REMOVAL Right   . VAGINAL HYSTERECTOMY     tah/bso  . VARICOSE VEIN SURGERY Right X 2  . WEIL OSTEOTOMY Right 02/12/2020   Procedure: DOUBLE L OSTEOTOMY;  Surgeon: Felipa Furnace, DPM;  Location: [place];  Service: Podiatry;  Laterality: Right;    There were no vitals filed for  this visit.   Subjective Assessment - 05/02/20 1138    Subjective States that she wasn't doing too well in the morning this morning but now is better. States mornings are typically worse. states she was 10/10 in her foot this morning so she took pain medicaitons. States she already did stretches and massaging this AM.    Pertinent History fibromyalgia, back pain, B TKR,    Limitations Sitting;Standing;Walking;House hold activities    How long can you sit comfortably? PT wants to bring RT foot onto Lt thigh after five minutes.    How long can you stand comfortably? Able to stand for about 5 mintues    How  long can you walk comfortably? 15 minutes with a cane but she pays for it when she gets home and takes pain medication before    Patient Stated Goals less swellin, less pain sit, stand and walk longer.    Currently in Pain? Yes    Pain Score 4     Pain Location Foot    Pain Orientation Right    Pain Descriptors / Indicators Aching;Sharp    Pain Onset More than a month ago              [facility] PT Assessment - 05/02/20 0001      Assessment   Medical Diagnosis s/p surgical repair of bunion and hammer toes.    Referring Provider (PT) Boneta Lucks                         [facility] Adult PT Treatment/Exercise - 05/02/20 0001      Manual Therapy   Manual Therapy Edema management    Manual therapy comments done seperate from all other aspects of treatment    Edema Management to right foot and toes - edema mobilization      Ankle Exercises: Seated   Other Seated Ankle Exercises DF/PF and ten inversion/eversion  AAROM on half foam x25 5" holds      Ankle Exercises: Standing   Other Standing Ankle Exercises tandem on black foam in// bars 3x5 attempts - max on right forward 18 seconds and max wiht left forward 27 seconds    Other Standing Ankle Exercises semi SLS on right 10" holds x5                  PT Education - 05/02/20 1203    Education Details on edema massage  and how to perform with light pressure, on focus on edema and ROM as tolerated.    Person(s) Educated Patient    Methods Explanation    Comprehension Verbalized understanding            PT Short Term Goals - 04/27/20 1420      PT SHORT TERM GOAL #1   Title PT to be completing her HEP to allow her pain in her foot to decrease to no more than a 6/10 to allow pt to be able to stand for 15 minute to complete a small meal.    Time 10    Period Days    Status New    Target Date 05/06/20      PT SHORT TERM GOAL #2   Title Pt to be wearing her compresion garment.  to be able to sit for 15 minutes without having increased Rt foot pain.    Time 10    Period Days    Status New             PT Long Term Goals - 04/27/20 1423      PT LONG TERM GOAL #1   Title PT to be completing and advance HEP to allow pt pain in her RT foot to be no greateter than a 3/10 to allow her to be able to stand for 20 minute to prepare a meal.    Time 3    Period Weeks    Status New    Target Date 05/18/20      PT LONG TERM GOAL #2   Title Pt to be able to sit for over 30 minutes to be able to watch TV, travel to [place] in  comfort for appointments.    Time 3    Period Weeks    Status New    Target Date 05/18/20      PT LONG TERM GOAL #3   Title Pt to be able to walk for 45 minutes or longer, with a cane,  without having increased Right foot pain.  to be able to complete her shopping in comfort    Period Weeks    Status New                 Plan - 05/02/20 1143    Clinical Impression Statement Session focused on edema and balance today. Tolerated this moderately well. No increase in pain noted end of session but fatigue in foot noted. Overall patent still with significant swelling throughout right foot. Educated patient on focus of edema management and typical healing process. Will continue with current POC as tolerated.    Personal Factors and Comorbidities Comorbidity 1;Comorbidity 2     Comorbidities LBP, OA    Examination-Activity Limitations Locomotion Level;Stand;Stairs;Squat;Sit    Examination-Participation Restrictions Cleaning;Community Activity;Yard Work    Stability/Clinical Decision Making Stable/Uncomplicated    Rehab Potential Good    PT Frequency 2x / week    PT Duration 3 weeks    PT Treatment/Interventions Patient/family education;Manual techniques;Therapeutic exercise;Balance training;Therapeutic activities    PT Next Visit Plan manual to decrease foot swelling, marble pick up,  heel raises, toe raises    PT Home Exercise Plan toe curlijng, toe adduction, isometric great toe extension.; 4/8 toe PROM, towel scrunches, seated DF/PF; 4/11 tandem on floor    Consulted and Agree with Plan of Care Patient           Patient will benefit from skilled therapeutic intervention in order to improve the following deficits and impairments:  Decreased activity tolerance,Decreased balance,Difficulty walking,Decreased range of motion,Decreased strength,Decreased scar mobility,Increased edema,Pain  Visit Diagnosis: Other abnormalities of gait and mobility  Pain in right foot     Problem List Patient Active Problem List   Diagnosis Date Noted  . LFTs abnormal 03/02/2020  . 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 GERD (gastroesophageal reflux disease) 09/22/2019  . SSBE (short-segment Barrett's esophagus) 09/22/2019  . Flatulence 09/22/2019  . Migraine 11/11/2018  . Abdominal pain, epigastric 10/14/2018  . LUQ pain 10/14/2018  . Attention deficit hyperactivity disorder (ADHD) 01/12/2018  . Insomnia 01/12/2018  . Elevated liver enzymes 09/10/2017  . History of diabetes mellitus 06/06/2017  . Primary osteoarthritis of right hand 06/06/2017  . Transaminasemia   . TIA (transient ischemic attack) 05/01/2017  . Dysphasia 05/01/2017  . Chronic pain syndrome 05/01/2017  . Confusion   . Presence of right artificial knee joint  09/17/2016  . H/O total knee replacement, right 08/15/2016  . Presence of retained hardware   . Memory disorder 08/14/2016  . Cerebrovascular disease 08/14/2016  . Unilateral primary osteoarthritis, right knee 05/29/2016  . Primary osteoarthritis of both feet 05/29/2016  . Trochanteric bursitis of both hips 05/25/2016  . Neck pain 05/25/2016  . Urinary urgency 04/19/2016  . Chronic low back pain 01/11/2016  . Depression 11/29/2015  . 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 Heart palpitations 12/14/2013  . Generalized osteoarthritis of multiple sites 11/04/2013  . Cerebral infarction ([place]) 10/30/2011  . PFO (patent foramen ovale) 10/30/2011  . Bradycardia 10/30/2011  . Chest pain 10/30/2011  . Hypothyroid   . Thrombophlebitis   . Sleep apnea   . Fibromyalgia   . Status post bariatric surgery  12/07/2010  . Vein disorder 11/29/2010  . S/P total hysterectomy and bilateral salpingo-oophorectomy 11/29/2010  . S/P exploratory laparotomy 11/29/2010  . S/P cholecystectomy 11/29/2010  . S/P ACL surgery 11/29/2010  . Morbid obesity ([place]) 11/10/2010   12:12 PM, 05/02/20 Jerene Pitch, DPT Physical Therapy with [facility]  [phone] office  [place] [address] [place], [place], [address] Phone: [phone]   Fax:  [phone]  Name: Debbie Pineda MRN: [id number] Date of Birth: 1960/05/14

## 2020-05-03 ENCOUNTER — Encounter (INDEPENDENT_AMBULATORY_CARE_PROVIDER_SITE_OTHER): Payer: Self-pay

## 2020-05-03 ENCOUNTER — Encounter (HOSPITAL_COMMUNITY): Payer: PPO | Admitting: Physical Therapy

## 2020-05-04 ENCOUNTER — Other Ambulatory Visit: Payer: Self-pay | Admitting: Podiatry

## 2020-05-04 ENCOUNTER — Encounter (HOSPITAL_COMMUNITY): Payer: Self-pay

## 2020-05-04 ENCOUNTER — Other Ambulatory Visit: Payer: Self-pay

## 2020-05-04 ENCOUNTER — Ambulatory Visit (HOSPITAL_COMMUNITY): Payer: PPO

## 2020-05-04 DIAGNOSIS — R2689 Other abnormalities of gait and mobility: Secondary | ICD-10-CM | POA: Diagnosis not present

## 2020-05-04 DIAGNOSIS — M79671 Pain in right foot: Secondary | ICD-10-CM

## 2020-05-04 MED ORDER — OXYCODONE-ACETAMINOPHEN 5-325 MG PO TABS: 1.0000 | ORAL_TABLET | ORAL | 0 refills | Status: DC | PRN

## 2020-05-04 NOTE — Therapy (Signed)
[facility] [address] [place], [place], [address] Phone: [phone]   Fax:  [phone]  Physical Therapy Treatment  Patient Details  Name: Debbie Pineda MRN: [id number] Date of Birth: Nov 21, 1960 Referring Provider (PT): Boneta Lucks   Encounter Date: 05/04/2020   PT End of Session - 05/04/20 1200    Visit Number 4    Number of Visits 6    Date for PT Re-Evaluation 05/18/20    Authorization Type [employer]    Authorization - Visit Number 4    Authorization - Number of Visits 6    Progress Note Due on Visit 6    PT Start Time 1100   pt late for apt   PT Stop Time 1135    PT Time Calculation (min) 35 min    Activity Tolerance Patient tolerated treatment well    Behavior During Therapy [facility] for tasks assessed/performed           Past Medical History:  Diagnosis Date  . ADD (attention deficit disorder)   . Arthritis    "all over"  . Cerebral infarction ([place]) 10/30/2011  . Cerebrovascular disease 08/14/2016  . Chronic back pain    "all over"  . Chronic low back pain 01/11/2016  . Complication of anesthesia    tends to have hypotension when NPO and post-anesthesia  . Constipation    takes stool softener daily  . Degenerative disk disease   . Degenerative joint disease   . DVT (deep venous thrombosis) ([place]) 2014   RLE  . Family history of adverse reaction to anesthesia    a family member woke up during surgery; "think it was my mom"  . Fibromyalgia   . Generalized osteoarthritis of multiple sites 11/04/2013  . GERD (gastroesophageal reflux disease)   . Heart palpitations 12/14/2013   resolved  . History of blood clots    superficial  . Hypoglycemia   . Hypothyroid    takes Synthroid daily  . Incomplete emptying of bladder   . Insomnia    takes Trazodone nightly  . Iron deficiency anemia    takes Ferrous Sulfate daily  . Joint pain   . Joint swelling    knees and ankles  . Memory disorder 08/14/2016   . Morbid obesity ([place])   . Nausea    takes Zofran as needed.Seeing GI doc  . Neck pain 05/25/2016  . OSA on CPAP    tested more than 5 yrs ago.    . Osteoarthritis   . Osteopenia    in feet  . PFO (patent foramen ovale)    no murmur  . Pre-diabetes   . Primary osteoarthritis of both feet 05/29/2016   Right bunionectomy August 2017 by Dr. Sharol Given  . Scoliosis   . Skin abnormality 02/04/2020   raised area on lip  . Sleep apnea    mild osa per pt  . Stroke [facility]) "several"last 2014   right foot weakness; memory issues, black spot right visual field since" (03/23/2015)  . Thrombophlebitis   . Trochanteric bursitis of both hips 05/25/2016  . Unilateral primary osteoarthritis, right knee 05/29/2016  . Urinary urgency 04/19/2016  . Vein disorder 11/29/2010   varicose veins both legs  . Wears glasses     Past Surgical History:  Procedure Laterality Date  . BIOPSY  11/14/2018   Procedure: BIOPSY;  Surgeon: Rogene Houston, MD;  [facility];  Service: Endoscopy;;  esophagusgastric  . BONE EXCISION Right  08/29/2017   Procedure: right trapezium excision;  Surgeon: Daryll Brod, MD;  Location: [place];  Service: Orthopedics;  Laterality: Right;  . BUNIONECTOMY Right 08/2015  . CARDIAC CATHETERIZATION     2008.  "it was fine" (not sure why she had it done, and doesn't know where)  . CARPOMETACARPEL SUSPENSION PLASTY Right 08/29/2017   Procedure: SUSPENSION PLASTY RIGHT THUMB;  Surgeon: Daryll Brod, MD;  Location: [place];  Service: Orthopedics;  Laterality: Right;  . COLONOSCOPY N/A 03/25/2013   Procedure: COLONOSCOPY;  Surgeon: Rogene Houston, MD;  [facility];  Service: Endoscopy;  Laterality: N/A;  930  . ESOPHAGOGASTRODUODENOSCOPY    . ESOPHAGOGASTRODUODENOSCOPY (EGD) WITH PROPOFOL N/A 11/14/2018   Procedure: ESOPHAGOGASTRODUODENOSCOPY (EGD) WITH PROPOFOL;  Surgeon: Rogene Houston, MD;  [facility];  Service: Endoscopy;   Laterality: N/A;  1:55pm-office moved to 11:00am/pt notified to arrive at 9:30am per KF  . EXPLORATORY LAPAROTOMY     "took fallopian tubes out"  . HALLUX FUSION Right 02/12/2020   Procedure: HALLUX INTERPHANGEAL JOINT  FUSION;  Surgeon: Felipa Furnace, DPM;  Location: [place];  Service: Podiatry;  Laterality: Right;  . HAMMER TOE SURGERY Right 02/12/2020   Procedure: SECOND AND THIRD HAMMER TOE CORRECTION; CAPSULOTOMY SECOND INTERPHALANGEAL JOINT;  Surgeon: Felipa Furnace, DPM;  Location: [place];  Service: Podiatry;  Laterality: Right;  . JOINT REPLACEMENT     bil knee   . KNEE ARTHROSCOPY Left   . KNEE ARTHROSCOPY W/ ACL RECONSTRUCTION Right yrs ago   "added pins"  . LAPAROSCOPIC CHOLECYSTECTOMY  ~ 2001  . ROUX-EN-Y GASTRIC BYPASS  11/20/2010   [place]  . SPINAL CORD STIMULATOR INSERTION N/A 04/18/2017   Procedure: LUMBAR SPINAL CORD STIMULATOR INSERTION;  Surgeon: Clydell Hakim, MD;  Location: [place];  Service: Neurosurgery;  Laterality: N/A;  LUMBAR SPINAL CORD STIMULATOR INSERTION  . TENDON TRANSFER Right 08/29/2017   Procedure: right abductor pollicis longus transfer;  Surgeon: Daryll Brod, MD;  Location: [place];  Service: Orthopedics;  Laterality: Right;  . TOTAL KNEE ARTHROPLASTY Left 03/23/2015   Procedure: TOTAL KNEE ARTHROPLASTY;  Surgeon: Newt Minion, MD;  Location: [place];  Service: Orthopedics;  Laterality: Left;  . TOTAL KNEE ARTHROPLASTY Right 08/15/2016   Procedure: RIGHT TOTAL KNEE ARTHROPLASTY, REMOVAL ACL SCREWS;  Surgeon: Newt Minion, MD;  Location: [place];  Service: Orthopedics;  Laterality: Right;  . TOTAL KNEE ARTHROPLASTY WITH HARDWARE REMOVAL Right   . VAGINAL HYSTERECTOMY     tah/bso  . VARICOSE VEIN SURGERY Right X 2  . WEIL OSTEOTOMY Right 02/12/2020   Procedure: DOUBLE L OSTEOTOMY;  Surgeon: Felipa Furnace, DPM;  Location: [place];  Service: Podiatry;  Laterality: Right;    There were  no vitals filed for this visit.   Subjective Assessment - 05/04/20 1102    Subjective Pt arrived wearing compression socks, reports significant reduction in edema.  Stated she drove a lot yesterday and swelling was minimal due to wearing compression socks.    Current pain scale between 3-4/10, increases with weight bearing.    Pertinent History fibromyalgia, back pain, B TKR,    Patient Stated Goals less swellin, less pain sit, stand and walk longer.    Currently in Pain? Yes    Pain Score 4     Pain Location Foot    Pain Orientation Right    Pain Descriptors / Indicators Aching;Tender  Pain Type Acute pain;Surgical pain    Pain Onset More than a month ago    Pain Frequency Constant                             [facility] Adult PT Treatment/Exercise - 05/04/20 0001      Manual Therapy   Manual Therapy Edema management    Manual therapy comments done seperate from all other aspects of treatment    Edema Management to right foot and toes - edema mobilization      Ankle Exercises: Standing   Heel Raises 10 reps   2 sets 5 reps with RTB under great toe     Ankle Exercises: Seated   Heel Raises 10 reps   RTB under great toe to keep great toe down on floor   Toe Raise 20 reps;5 seconds   4 sets 5 reps 5"   Other Seated Ankle Exercises self care massage with toe spread x 2 min                    PT Short Term Goals - 04/27/20 1420      PT SHORT TERM GOAL #1   Title PT to be completing her HEP to allow her pain in her foot to decrease to no more than a 6/10 to allow pt to be able to stand for 15 minute to complete a small meal.    Time 10    Period Days    Status New    Target Date 05/06/20      PT SHORT TERM GOAL #2   Title Pt to be wearing her compresion garment.  to be able to sit for 15 minutes without having increased Rt foot pain.    Time 10    Period Days    Status New             PT Long Term Goals - 04/27/20 1423      PT LONG TERM GOAL  #1   Title PT to be completing and advance HEP to allow pt pain in her RT foot to be no greateter than a 3/10 to allow her to be able to stand for 20 minute to prepare a meal.    Time 3    Period Weeks    Status New    Target Date 05/18/20      PT LONG TERM GOAL #2   Title Pt to be able to sit for over 30 minutes to be able to watch TV, travel to [place] in comfort for appointments.    Time 3    Period Weeks    Status New    Target Date 05/18/20      PT LONG TERM GOAL #3   Title Pt to be able to walk for 45 minutes or longer, with a cane,  without having increased Right foot pain.  to be able to complete her shopping in comfort    Period Weeks    Status New                 Plan - 05/04/20 1356    Clinical Impression Statement Pt late for apt today, unable to complete all activities as planned.  Pt presents with decreased edema subjectively though does continues to have some swelling dorsal aspect and toes, manual retrograde massage complete with reports of comfort following.  Session focus on foot/toe mobility and strengthening.  Difficulty  keeping base of great toe on ground during heel raises, add theraband for tactile cueing with improve mechanics following.  Pt given RTB to complete at home.    Personal Factors and Comorbidities Comorbidity 1;Comorbidity 2    Comorbidities LBP, OA    Examination-Activity Limitations Locomotion Level;Stand;Stairs;Squat;Sit    Examination-Participation Restrictions Cleaning;Community Activity;Yard Work    Stability/Clinical Decision Making Stable/Uncomplicated    Clinical Decision Making Moderate    Rehab Potential Good    PT Frequency 2x / week    PT Duration 3 weeks    PT Treatment/Interventions Patient/family education;Manual techniques;Therapeutic exercise;Balance training;Therapeutic activities    PT Next Visit Plan manual to decrease foot swelling, marble pick up,  heel raises, toe raises    PT Home Exercise Plan toe curlijng, toe  adduction, isometric great toe extension.; 4/8 toe PROM, towel scrunches, seated DF/PF; 4/11 tandem on floor; 4/13: RTB under great toe during heel raises.    Consulted and Agree with Plan of Care Patient           Patient will benefit from skilled therapeutic intervention in order to improve the following deficits and impairments:  Decreased activity tolerance,Decreased balance,Difficulty walking,Decreased range of motion,Decreased strength,Decreased scar mobility,Increased edema,Pain  Visit Diagnosis: Pain in right foot  Other abnormalities of gait and mobility     Problem List Patient Active Problem List   Diagnosis Date Noted  . LFTs abnormal 03/02/2020  . 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 GERD (gastroesophageal reflux disease) 09/22/2019  . SSBE (short-segment Barrett's esophagus) 09/22/2019  . Flatulence 09/22/2019  . Migraine 11/11/2018  . Abdominal pain, epigastric 10/14/2018  . LUQ pain 10/14/2018  . Attention deficit hyperactivity disorder (ADHD) 01/12/2018  . Insomnia 01/12/2018  . Elevated liver enzymes 09/10/2017  . History of diabetes mellitus 06/06/2017  . Primary osteoarthritis of right hand 06/06/2017  . Transaminasemia   . TIA (transient ischemic attack) 05/01/2017  . Dysphasia 05/01/2017  . Chronic pain syndrome 05/01/2017  . Confusion   . Presence of right artificial knee joint 09/17/2016  . H/O total knee replacement, right 08/15/2016  . Presence of retained hardware   . Memory disorder 08/14/2016  . Cerebrovascular disease 08/14/2016  . Unilateral primary osteoarthritis, right knee 05/29/2016  . Primary osteoarthritis of both feet 05/29/2016  . Trochanteric bursitis of both hips 05/25/2016  . Neck pain 05/25/2016  . Urinary urgency 04/19/2016  . Chronic low back pain 01/11/2016  . Depression 11/29/2015  . 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 Heart palpitations 12/14/2013  . Generalized  osteoarthritis of multiple sites 11/04/2013  . Cerebral infarction ([place]) 10/30/2011  . PFO (patent foramen ovale) 10/30/2011  . Bradycardia 10/30/2011  . Chest pain 10/30/2011  . Hypothyroid   . Thrombophlebitis   . Sleep apnea   . Fibromyalgia   . Status post bariatric surgery 12/07/2010  . Vein disorder 11/29/2010  . S/P total hysterectomy and bilateral salpingo-oophorectomy 11/29/2010  . S/P exploratory laparotomy 11/29/2010  . S/P cholecystectomy 11/29/2010  . S/P ACL surgery 11/29/2010  . Morbid obesity ([place]) 11/10/2010   Ihor Austin, LPTA/CLT; CBIS [phone]  Aldona Lento 05/04/2020, 2:00 PM  [facility] [address] [place], [place], [address] Phone: [phone]   Fax:  [phone]  Name: Debbie Pineda MRN: [id number] Date of Birth: [birth year]

## 2020-05-04 NOTE — Patient Instructions (Signed)
Toe / Heel Raise (Standing)    Perform barefoot.  Place theraband under ball of great toe.  Keep toe on ground and raise up onto toes raising heels.   Repeat 10 times, 2 sets.  Copyright  VHI. All rights reserved.

## 2020-05-06 ENCOUNTER — Ambulatory Visit (HOSPITAL_COMMUNITY): Payer: PPO | Admitting: Physical Therapy

## 2020-05-10 ENCOUNTER — Encounter (HOSPITAL_COMMUNITY): Payer: Self-pay

## 2020-05-10 ENCOUNTER — Ambulatory Visit (HOSPITAL_COMMUNITY): Payer: PPO

## 2020-05-10 ENCOUNTER — Other Ambulatory Visit: Payer: Self-pay

## 2020-05-10 DIAGNOSIS — R2689 Other abnormalities of gait and mobility: Secondary | ICD-10-CM

## 2020-05-10 DIAGNOSIS — M79671 Pain in right foot: Secondary | ICD-10-CM

## 2020-05-10 NOTE — Therapy (Signed)
[place], [place], [address] Phone: [phone]   Fax:  [phone]  Physical Therapy Treatment  Patient Details  Name: Debbie Pineda MRN: [id number] Date of Birth: January 15, 1961 Referring Provider (PT): Boneta Lucks   Encounter Date: 05/10/2020   PT End of Session - 05/10/20 1218    Visit Number 5    Number of Visits 6    Date for PT Re-Evaluation 05/18/20    Authorization Type [employer]    Authorization - Visit Number 5    Authorization - Number of Visits 6    Progress Note Due on Visit 6    PT Start Time 3382    PT Stop Time 1219    PT Time Calculation (min) 34 min    Activity Tolerance Patient tolerated treatment well;Patient limited by pain;No increased pain    Behavior During Therapy WFL for tasks assessed/performed           Past Medical History:  Diagnosis Date  . ADD (attention deficit disorder)   . Arthritis    "all over"  . Cerebral infarction ([place]) 10/30/2011  . Cerebrovascular disease 08/14/2016  . Chronic back pain    "all over"  . Chronic low back pain 01/11/2016  . Complication of anesthesia    tends to have hypotension when NPO and post-anesthesia  . Constipation    takes stool softener daily  . Degenerative disk disease   . Degenerative joint disease   . DVT (deep venous thrombosis) ([place]) 2014   RLE  . Family history of adverse reaction to anesthesia    a family member woke up during surgery; "think it was my mom"  . Fibromyalgia   . Generalized osteoarthritis of multiple sites 11/04/2013  . GERD (gastroesophageal reflux disease)   . Heart palpitations 12/14/2013   resolved  . History of blood clots    superficial  . Hypoglycemia   . Hypothyroid    takes Synthroid daily  . Incomplete emptying of bladder   . Insomnia    takes Trazodone nightly  . Iron deficiency anemia    takes Ferrous Sulfate daily  . Joint pain   . Joint swelling    knees and ankles  .  Memory disorder 08/14/2016  . Morbid obesity ([place])   . Nausea    takes Zofran as needed.Seeing GI doc  . Neck pain 05/25/2016  . OSA on CPAP    tested more than 5 yrs ago.    . Osteoarthritis   . Osteopenia    in feet  . PFO (patent foramen ovale)    no murmur  . Pre-diabetes   . Primary osteoarthritis of both feet 05/29/2016   Right bunionectomy August 2017 by Dr. Sharol Given  . Scoliosis   . Skin abnormality 02/04/2020   raised area on lip  . Sleep apnea    mild osa per pt  . Stroke [facility]) "several"last 2014   right foot weakness; memory issues, black spot right visual field since" (03/23/2015)  . Thrombophlebitis   . Trochanteric bursitis of both hips 05/25/2016  . Unilateral primary osteoarthritis, right knee 05/29/2016  . Urinary urgency 04/19/2016  . Vein disorder 11/29/2010   varicose veins both legs  . Wears glasses     Past Surgical History:  Procedure Laterality Date  . BIOPSY  11/14/2018   Procedure: BIOPSY;  Surgeon: Rogene Houston, MD;  [facility];  Service: Endoscopy;;  esophagusgastric  . BONE EXCISION Right  08/29/2017   Procedure: right trapezium excision;  Surgeon: Daryll Brod, MD;  Location: [facility];  Service: Orthopedics;  Laterality: Right;  . BUNIONECTOMY Right 08/2015  . CARDIAC CATHETERIZATION     2008.  "it was fine" (not sure why she had it done, and doesn't know where)  . CARPOMETACARPEL SUSPENSION PLASTY Right 08/29/2017   Procedure: SUSPENSION PLASTY RIGHT THUMB;  Surgeon: Daryll Brod, MD;  Location: [facility];  Service: Orthopedics;  Laterality: Right;  . COLONOSCOPY N/A 03/25/2013   Procedure: COLONOSCOPY;  Surgeon: Rogene Houston, MD;  [facility];  Service: Endoscopy;  Laterality: N/A;  930  . ESOPHAGOGASTRODUODENOSCOPY    . ESOPHAGOGASTRODUODENOSCOPY (EGD) WITH PROPOFOL N/A 11/14/2018   Procedure: ESOPHAGOGASTRODUODENOSCOPY (EGD) WITH PROPOFOL;  Surgeon: Rogene Houston, MD;  [facility];   Service: Endoscopy;  Laterality: N/A;  1:55pm-office moved to 11:00am/pt notified to arrive at 9:30am per KF  . EXPLORATORY LAPAROTOMY     "took fallopian tubes out"  . HALLUX FUSION Right 02/12/2020   Procedure: HALLUX INTERPHANGEAL JOINT  FUSION;  Surgeon: Felipa Furnace, DPM;  Location: [facility];  Service: Podiatry;  Laterality: Right;  . HAMMER TOE SURGERY Right 02/12/2020   Procedure: SECOND AND THIRD HAMMER TOE CORRECTION; CAPSULOTOMY SECOND INTERPHALANGEAL JOINT;  Surgeon: Felipa Furnace, DPM;  Location: [facility];  Service: Podiatry;  Laterality: Right;  . JOINT REPLACEMENT     bil knee   . KNEE ARTHROSCOPY Left   . KNEE ARTHROSCOPY W/ ACL RECONSTRUCTION Right yrs ago   "added pins"  . LAPAROSCOPIC CHOLECYSTECTOMY  ~ 2001  . ROUX-EN-Y GASTRIC BYPASS  11/20/2010   [place]  . SPINAL CORD STIMULATOR INSERTION N/A 04/18/2017   Procedure: LUMBAR SPINAL CORD STIMULATOR INSERTION;  Surgeon: Clydell Hakim, MD;  Location: [facility];  Service: Neurosurgery;  Laterality: N/A;  LUMBAR SPINAL CORD STIMULATOR INSERTION  . TENDON TRANSFER Right 08/29/2017   Procedure: right abductor pollicis longus transfer;  Surgeon: Daryll Brod, MD;  Location: [facility];  Service: Orthopedics;  Laterality: Right;  . TOTAL KNEE ARTHROPLASTY Left 03/23/2015   Procedure: TOTAL KNEE ARTHROPLASTY;  Surgeon: Newt Minion, MD;  Location: [facility];  Service: Orthopedics;  Laterality: Left;  . TOTAL KNEE ARTHROPLASTY Right 08/15/2016   Procedure: RIGHT TOTAL KNEE ARTHROPLASTY, REMOVAL ACL SCREWS;  Surgeon: Newt Minion, MD;  Location: [facility];  Service: Orthopedics;  Laterality: Right;  . TOTAL KNEE ARTHROPLASTY WITH HARDWARE REMOVAL Right   . VAGINAL HYSTERECTOMY     tah/bso  . VARICOSE VEIN SURGERY Right X 2  . WEIL OSTEOTOMY Right 02/12/2020   Procedure: DOUBLE L OSTEOTOMY;  Surgeon: Felipa Furnace, DPM;  Location: [facility];  Service: Podiatry;  Laterality:  Right;    There were no vitals filed for this visit.   Subjective Assessment - 05/10/20 1147    Subjective Pt late for apt.  Stated she was very sore following last session.  Stated she has increased LBP today, foot pain scale    Pertinent History fibromyalgia, back pain, B TKR,    Patient Stated Goals less swellin, less pain sit, stand and walk longer.    Currently in Pain? Yes    Pain Score 5     Pain Location Foot    Pain Orientation Right    Pain Descriptors / Indicators Sharp;Aching    Pain Type Acute pain;Surgical pain   pain with weight bearing  Pain Radiating Towards to above ankle    Pain Onset More than a month ago    Pain Frequency Constant    Aggravating Factors  weight bearing    Pain Relieving Factors elevatin    Effect of Pain on Daily Activities limits    Multiple Pain Sites Yes    Pain Score 5    Pain Location Back    Pain Orientation Lower    Pain Descriptors / Indicators Sore;Aching;Tender;Sharp    Pain Type Chronic pain    Pain Onset More than a month ago    Pain Frequency Constant    Aggravating Factors  standing    Pain Relieving Factors sitting nice and tall, laying on Lt side    Effect of Pain on Daily Activities limits                             [facility] Adult PT Treatment/Exercise - 05/10/20 0001      Manual Therapy   Manual Therapy Edema management    Manual therapy comments done seperate from all other aspects of treatment    Edema Management to right foot and toes - edema mobilization      Ankle Exercises: Seated   Towel Crunch 2 reps    Marble Pickup 10 times    Heel Raises 10 reps                  PT Education - 05/10/20 1259    Education Details Discussed benefits with changing compression garment every 6 months, to get proper fit orthotic for comfort in shoes.    Person(s) Educated Patient    Methods Explanation    Comprehension Verbalized understanding            PT Short Term Goals - 04/27/20 1420       PT SHORT TERM GOAL #1   Title PT to be completing her HEP to allow her pain in her foot to decrease to no more than a 6/10 to allow pt to be able to stand for 15 minute to complete a small meal.    Time 10    Period Days    Status New    Target Date 05/06/20      PT SHORT TERM GOAL #2   Title Pt to be wearing her compresion garment.  to be able to sit for 15 minutes without having increased Rt foot pain.    Time 10    Period Days    Status New             PT Long Term Goals - 04/27/20 1423      PT LONG TERM GOAL #1   Title PT to be completing and advance HEP to allow pt pain in her RT foot to be no greateter than a 3/10 to allow her to be able to stand for 20 minute to prepare a meal.    Time 3    Period Weeks    Status New    Target Date 05/18/20      PT LONG TERM GOAL #2   Title Pt to be able to sit for over 30 minutes to be able to watch TV, travel to [place] in comfort for appointments.    Time 3    Period Weeks    Status New    Target Date 05/18/20      PT LONG TERM GOAL #3   Title Pt  to be able to walk for 45 minutes or longer, with a cane,  without having increased Right foot pain.  to be able to complete her shopping in comfort    Period Weeks    Status New                 Plan - 05/10/20 1255    Clinical Impression Statement Pt late for apt.  Reports increased soreness and edema present dorsal aspect of foot.  Pt reports discomfort with current orthotic in shoes, stated she has had for over a year.  Discussed and encouraged pt to get new orthotic as weight bearing has changed on foot with age and post-op.  Also discussed benefits with compression garments and to replace every 6 months.  Measurements taken and paperwork given for [employer] [place] [employer] on foot/toe mobility and strengthening.  Kept exercises in seated position due to increased pain with weight bearing.  Reports of decreased pain at EOS following manual for edema control.     Personal Factors and Comorbidities Comorbidity 1;Comorbidity 2    Comorbidities LBP, OA    Examination-Activity Limitations Locomotion Level;Stand;Stairs;Squat;Sit    Examination-Participation Restrictions Cleaning;Community Activity;Yard Work    Stability/Clinical Decision Making Stable/Uncomplicated    Clinical Decision Making Moderate    Rehab Potential Good    PT Frequency 2x / week    PT Duration 3 weeks    PT Treatment/Interventions Patient/family education;Manual techniques;Therapeutic exercise;Balance training;Therapeutic activities    PT Next Visit Plan Review goals next session for progress note.  Manual to decrease foot swelling, marble pick up,  heel raises, toe raises    PT Home Exercise Plan toe curlijng, toe adduction, isometric great toe extension.; 4/8 toe PROM, towel scrunches, seated DF/PF; 4/11 tandem on floor; 4/13: RTB under great toe during heel raises.    Consulted and Agree with Plan of Care Patient           Patient will benefit from skilled therapeutic intervention in order to improve the following deficits and impairments:  Decreased activity tolerance,Decreased balance,Difficulty walking,Decreased range of motion,Decreased strength,Decreased scar mobility,Increased edema,Pain  Visit Diagnosis: Other abnormalities of gait and mobility  Pain in right foot     Problem List Patient Active Problem List   Diagnosis Date Noted  . LFTs abnormal 03/02/2020  . 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 GERD (gastroesophageal reflux disease) 09/22/2019  . SSBE (short-segment Barrett's esophagus) 09/22/2019  . Flatulence 09/22/2019  . Migraine 11/11/2018  . Abdominal pain, epigastric 10/14/2018  . LUQ pain 10/14/2018  . Attention deficit hyperactivity disorder (ADHD) 01/12/2018  . Insomnia 01/12/2018  . Elevated liver enzymes 09/10/2017  . History of diabetes mellitus 06/06/2017  . Primary osteoarthritis of right hand  06/06/2017  . Transaminasemia   . TIA (transient ischemic attack) 05/01/2017  . Dysphasia 05/01/2017  . Chronic pain syndrome 05/01/2017  . Confusion   . Presence of right artificial knee joint 09/17/2016  . H/O total knee replacement, right 08/15/2016  . Presence of retained hardware   . Memory disorder 08/14/2016  . Cerebrovascular disease 08/14/2016  . Unilateral primary osteoarthritis, right knee 05/29/2016  . Primary osteoarthritis of both feet 05/29/2016  . Trochanteric bursitis of both hips 05/25/2016  . Neck pain 05/25/2016  . Urinary urgency 04/19/2016  . Chronic low back pain 01/11/2016  . Depression 11/29/2015  . 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 Heart palpitations 12/14/2013  . Generalized osteoarthritis of multiple sites 11/04/2013  . Cerebral  infarction ([place]) 10/30/2011  . PFO (patent foramen ovale) 10/30/2011  . Bradycardia 10/30/2011  . Chest pain 10/30/2011  . Hypothyroid   . Thrombophlebitis   . Sleep apnea   . Fibromyalgia   . Status post bariatric surgery 12/07/2010  . Vein disorder 11/29/2010  . S/P total hysterectomy and bilateral salpingo-oophorectomy 11/29/2010  . S/P exploratory laparotomy 11/29/2010  . S/P cholecystectomy 11/29/2010  . S/P ACL surgery 11/29/2010  . Morbid obesity ([place]) 11/10/2010   Ihor Austin, LPTA/CLT; CBIS [phone]  Aldona Lento 05/10/2020, 1:02 PM  [place], [place], [address] Phone: [phone]   Fax:  [phone]  Name: Debbie Pineda MRN: [id number] Date of Birth: [birth year]

## 2020-05-11 ENCOUNTER — Other Ambulatory Visit: Payer: Self-pay | Admitting: Podiatry

## 2020-05-11 MED ORDER — OXYCODONE-ACETAMINOPHEN 5-325 MG PO TABS: 1.0000 | ORAL_TABLET | ORAL | 0 refills | Status: DC | PRN

## 2020-05-12 DIAGNOSIS — I10 Essential (primary) hypertension: Secondary | ICD-10-CM | POA: Diagnosis not present

## 2020-05-12 DIAGNOSIS — M791 Myalgia, unspecified site: Secondary | ICD-10-CM | POA: Diagnosis not present

## 2020-05-12 DIAGNOSIS — Z9884 Bariatric surgery status: Secondary | ICD-10-CM | POA: Diagnosis not present

## 2020-05-12 DIAGNOSIS — F1120 Opioid dependence, uncomplicated: Secondary | ICD-10-CM | POA: Diagnosis not present

## 2020-05-12 DIAGNOSIS — Z6833 Body mass index (BMI) 33.0-33.9, adult: Secondary | ICD-10-CM | POA: Diagnosis not present

## 2020-05-13 ENCOUNTER — Ambulatory Visit (HOSPITAL_COMMUNITY): Payer: PPO | Admitting: Physical Therapy

## 2020-05-13 ENCOUNTER — Other Ambulatory Visit: Payer: Self-pay

## 2020-05-13 DIAGNOSIS — R2689 Other abnormalities of gait and mobility: Secondary | ICD-10-CM | POA: Diagnosis not present

## 2020-05-13 DIAGNOSIS — M79671 Pain in right foot: Secondary | ICD-10-CM

## 2020-05-13 DIAGNOSIS — G4733 Obstructive sleep apnea (adult) (pediatric): Secondary | ICD-10-CM | POA: Diagnosis not present

## 2020-05-13 NOTE — Therapy (Signed)
[facility] [address] [place], [place], [address] Phone: [phone]   Fax:  [phone]  Physical Therapy Treatment and Discharge Note   Patient Details  Name: Debbie Pineda MRN: [id number] Date of Birth: [birth year] Referring Provider (PT): Boneta Lucks  PHYSICAL THERAPY DISCHARGE SUMMARY  Visits from Start of Care: 6  Current functional level related to goals / functional outcomes: See below   Remaining deficits: See below   Education / Equipment: See below Plan: Patient agrees to discharge.  Patient goals were partially met. Patient is being discharged due to                                                     ?????    Need to focus on current HEP and stage of rehab   Encounter Date: 05/13/2020   PT End of Session - 05/13/20 1009    Visit Number 6    Number of Visits 6    Date for PT Re-Evaluation 05/18/20    Authorization Type [employer], no auth no VL    Authorization - Visit Number --    Authorization - Number of Visits --    Progress Note Due on Visit 6    PT Start Time 1007   pt late to session   PT Stop Time 1045    PT Time Calculation (min) 38 min    Activity Tolerance Patient tolerated treatment well;Patient limited by pain;No increased pain    Behavior During Therapy WFL for tasks assessed/performed           Past Medical History:  Diagnosis Date  . ADD (attention deficit disorder)   . Arthritis    "all over"  . Cerebral infarction ([place]) 10/30/2011  . Cerebrovascular disease 08/14/2016  . Chronic back pain    "all over"  . Chronic low back pain 01/11/2016  . Complication of anesthesia    tends to have hypotension when NPO and post-anesthesia  . Constipation    takes stool softener daily  . Degenerative disk disease   . Degenerative joint disease   . DVT (deep venous thrombosis) ([place]) 2014   RLE  . Family history of adverse reaction to anesthesia    a family member woke up during  surgery; "think it was my mom"  . Fibromyalgia   . Generalized osteoarthritis of multiple sites 11/04/2013  . GERD (gastroesophageal reflux disease)   . Heart palpitations 12/14/2013   resolved  . History of blood clots    superficial  . Hypoglycemia   . Hypothyroid    takes Synthroid daily  . Incomplete emptying of bladder   . Insomnia    takes Trazodone nightly  . Iron deficiency anemia    takes Ferrous Sulfate daily  . Joint pain   . Joint swelling    knees and ankles  . Memory disorder 08/14/2016  . Morbid obesity ([place])   . Nausea    takes Zofran as needed.Seeing GI doc  . Neck pain 05/25/2016  . OSA on CPAP    tested more than 5 yrs ago.    . Osteoarthritis   . Osteopenia    in feet  . PFO (patent foramen ovale)    no murmur  . Pre-diabetes   . Primary osteoarthritis of both feet 05/29/2016   Right  bunionectomy August 2017 by Dr. Sharol Given  . Scoliosis   . Skin abnormality 02/04/2020   raised area on lip  . Sleep apnea    mild osa per pt  . Stroke [facility]) "several"last 2014   right foot weakness; memory issues, black spot right visual field since" (03/23/2015)  . Thrombophlebitis   . Trochanteric bursitis of both hips 05/25/2016  . Unilateral primary osteoarthritis, right knee 05/29/2016  . Urinary urgency 04/19/2016  . Vein disorder 11/29/2010   varicose veins both legs  . Wears glasses     Past Surgical History:  Procedure Laterality Date  . BIOPSY  11/14/2018   Procedure: BIOPSY;  Surgeon: Rogene Houston, MD;  [facility];  Service: Endoscopy;;  esophagusgastric  . BONE EXCISION Right 08/29/2017   Procedure: right trapezium excision;  Surgeon: Daryll Brod, MD;  Location: [facility];  Service: Orthopedics;  Laterality: Right;  . BUNIONECTOMY Right 08/2015  . CARDIAC CATHETERIZATION     2008.  "it was fine" (not sure why she had it done, and doesn't know where)  . CARPOMETACARPEL SUSPENSION PLASTY Right 08/29/2017   Procedure: SUSPENSION  PLASTY RIGHT THUMB;  Surgeon: Daryll Brod, MD;  Location: [facility];  Service: Orthopedics;  Laterality: Right;  . COLONOSCOPY N/A 03/25/2013   Procedure: COLONOSCOPY;  Surgeon: Rogene Houston, MD;  [facility];  Service: Endoscopy;  Laterality: N/A;  930  . ESOPHAGOGASTRODUODENOSCOPY    . ESOPHAGOGASTRODUODENOSCOPY (EGD) WITH PROPOFOL N/A 11/14/2018   Procedure: ESOPHAGOGASTRODUODENOSCOPY (EGD) WITH PROPOFOL;  Surgeon: Rogene Houston, MD;  [facility];  Service: Endoscopy;  Laterality: N/A;  1:55pm-office moved to 11:00am/pt notified to arrive at 9:30am per KF  . EXPLORATORY LAPAROTOMY     "took fallopian tubes out"  . HALLUX FUSION Right 02/12/2020   Procedure: HALLUX INTERPHANGEAL JOINT  FUSION;  Surgeon: Felipa Furnace, DPM;  Location: [facility];  Service: Podiatry;  Laterality: Right;  . HAMMER TOE SURGERY Right 02/12/2020   Procedure: SECOND AND THIRD HAMMER TOE CORRECTION; CAPSULOTOMY SECOND INTERPHALANGEAL JOINT;  Surgeon: Felipa Furnace, DPM;  Location: [facility];  Service: Podiatry;  Laterality: Right;  . JOINT REPLACEMENT     bil knee   . KNEE ARTHROSCOPY Left   . KNEE ARTHROSCOPY W/ ACL RECONSTRUCTION Right yrs ago   "added pins"  . LAPAROSCOPIC CHOLECYSTECTOMY  ~ 2001  . ROUX-EN-Y GASTRIC BYPASS  11/20/2010   [place]  . SPINAL CORD STIMULATOR INSERTION N/A 04/18/2017   Procedure: LUMBAR SPINAL CORD STIMULATOR INSERTION;  Surgeon: Clydell Hakim, MD;  Location: [facility];  Service: Neurosurgery;  Laterality: N/A;  LUMBAR SPINAL CORD STIMULATOR INSERTION  . TENDON TRANSFER Right 08/29/2017   Procedure: right abductor pollicis longus transfer;  Surgeon: Daryll Brod, MD;  Location: [facility];  Service: Orthopedics;  Laterality: Right;  . TOTAL KNEE ARTHROPLASTY Left 03/23/2015   Procedure: TOTAL KNEE ARTHROPLASTY;  Surgeon: Newt Minion, MD;  Location: [facility];  Service: Orthopedics;  Laterality: Left;   . TOTAL KNEE ARTHROPLASTY Right 08/15/2016   Procedure: RIGHT TOTAL KNEE ARTHROPLASTY, REMOVAL ACL SCREWS;  Surgeon: Newt Minion, MD;  Location: [facility];  Service: Orthopedics;  Laterality: Right;  . TOTAL KNEE ARTHROPLASTY WITH HARDWARE REMOVAL Right   . VAGINAL HYSTERECTOMY     tah/bso  . VARICOSE VEIN SURGERY Right X 2  . WEIL OSTEOTOMY Right 02/12/2020   Procedure: DOUBLE L OSTEOTOMY;  Surgeon: Boneta Lucks  P, DPM;  Location: [facility];  Service: Podiatry;  Laterality: Right;    There were no vitals filed for this visit.   Subjective Assessment - 05/13/20 1018    Subjective States overall she feels about 30% better since starting PT. STates she feels her toes don't initially move and she has to physically move them first. States she has trouble with balance and her heel raises    Pertinent History fibromyalgia, back pain, B TKR,    Patient Stated Goals less swellin, less pain sit, stand and walk longer.    Currently in Pain? Yes    Pain Onset More than a month ago    Pain Onset More than a month ago              [facility] PT Assessment - 05/13/20 0001      Assessment   Medical Diagnosis s/p surgical repair of bunion and hammer toes.    Referring Provider (PT) Boneta Lucks    Next MD Visit 05/18/2020      Observation/Other Assessments   Focus on Therapeutic Outcomes (FOTO)  40% function was 43% function      Circumferential Edema   Circumferential - Right figure 8- 50.5cm, at MTP 21.8cm    Circumferential - Left  figure 8- 50.3cm, at MTP 20.8cm      AROM   AROM Assessment Site Ankle    Right/Left Ankle Right;Left    Right Ankle Dorsiflexion 15    Right Ankle Plantar Flexion 60    Left Ankle Dorsiflexion 15    Left Ankle Plantar Flexion 65                                 PT Education - 05/13/20 1041    Education Details on FOTO score, on current functional presentation, limitation, progress, anticiapted progress, rationale for  exercises. Discussed how balance is important for ankle strengthening and stability. answered all questions.    Person(s) Educated Patient    Methods Explanation    Comprehension Verbalized understanding            PT Short Term Goals - 05/13/20 1012      PT SHORT TERM GOAL #1   Title PT to be completing her HEP to allow her pain in her foot to decrease to no more than a 6/10 to allow pt to be able to stand for 15 minute to complete a small meal.    Baseline doing exercises everyother day    Time 10    Period Days    Status Achieved    Target Date 05/06/20      PT SHORT TERM GOAL #2   Title Pt to be wearing her compresion garment.  to be able to sit for 15 minutes without having increased Rt foot pain.    Baseline 30-40 minutes    Time 10    Period Days    Status Achieved             PT Long Term Goals - 05/13/20 1013      PT LONG TERM GOAL #1   Title PT to be completing and advance HEP to allow pt pain in her RT foot to be no greateter than a 3/10 to allow her to be able to stand for 20 minute to prepare a meal.    Baseline 6-7/10 pain afterwards    Time 3    Period  Weeks    Status On-going      PT LONG TERM GOAL #2   Title Pt to be able to sit for over 30 minutes to be able to watch TV, travel to [place] in comfort for appointments.    Baseline 30-40 minutes    Time 3    Period Weeks    Status Achieved      PT LONG TERM GOAL #3   Title Pt to be able to walk for 45 minutes or longer, with a cane,  without having increased Right foot pain.  to be able to complete her shopping in comfort    Baseline currently at 30 minutes    Period Weeks    Status On-going                 Plan - 05/13/20 1047    Clinical Impression Statement Patient with concerns about continued pain, lack of movement in her toes and how the exercises are very challenging and painful for her. Educated patient on rationale for exercises and how heel raises are very important form  function, stopping when back pain flares up. Discussed how balance exercises are really ankle strengthening exercises and not just working on balance as patient was concerned about her balance being bad prior to surgery and probably not getting better. Pain in foot and back continues to limit patient but patient pushes through pain with functional activities. Patient reassured about rehab process. Patient still with limitations but needs to focus on current exercises moving forward and build up overall standing tolerance and endurance. Patient to discharge from PT to HEP at this time secondary to independence in HEP and current stage in healing.    Personal Factors and Comorbidities Comorbidity 1;Comorbidity 2    Comorbidities LBP, OA    Examination-Activity Limitations Locomotion Level;Stand;Stairs;Squat;Sit    Examination-Participation Restrictions Cleaning;Community Activity;Yard Work    Stability/Clinical Decision Making Stable/Uncomplicated    Rehab Potential Good    PT Frequency 2x / week    PT Duration 3 weeks    PT Treatment/Interventions Patient/family education;Manual techniques;Therapeutic exercise;Balance training;Therapeutic activities    PT Next Visit Plan DC to HEP    PT Home Exercise Plan toe curlijng, toe adduction, isometric great toe extension.; 4/8 toe PROM, towel scrunches, seated DF/PF; 4/11 tandem on floor; 4/13: RTB under great toe during heel raises.    Consulted and Agree with Plan of Care Patient           Patient will benefit from skilled therapeutic intervention in order to improve the following deficits and impairments:  Decreased activity tolerance,Decreased balance,Difficulty walking,Decreased range of motion,Decreased strength,Decreased scar mobility,Increased edema,Pain  Visit Diagnosis: Other abnormalities of gait and mobility  Pain in right foot     Problem List Patient Active Problem List   Diagnosis Date Noted  . LFTs abnormal 03/02/2020  .  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 GERD (gastroesophageal reflux disease) 09/22/2019  . SSBE (short-segment Barrett's esophagus) 09/22/2019  . Flatulence 09/22/2019  . Migraine 11/11/2018  . Abdominal pain, epigastric 10/14/2018  . LUQ pain 10/14/2018  . Attention deficit hyperactivity disorder (ADHD) 01/12/2018  . Insomnia 01/12/2018  . Elevated liver enzymes 09/10/2017  . History of diabetes mellitus 06/06/2017  . Primary osteoarthritis of right hand 06/06/2017  . Transaminasemia   . TIA (transient ischemic attack) 05/01/2017  . Dysphasia 05/01/2017  . Chronic pain syndrome 05/01/2017  . Confusion   . Presence of right artificial knee joint 09/17/2016  .  H/O total knee replacement, right 08/15/2016  . Presence of retained hardware   . Memory disorder 08/14/2016  . Cerebrovascular disease 08/14/2016  . Unilateral primary osteoarthritis, right knee 05/29/2016  . Primary osteoarthritis of both feet 05/29/2016  . Trochanteric bursitis of both hips 05/25/2016  . Neck pain 05/25/2016  . Urinary urgency 04/19/2016  . Chronic low back pain 01/11/2016  . Depression 11/29/2015  . 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 Heart palpitations 12/14/2013  . Generalized osteoarthritis of multiple sites 11/04/2013  . Cerebral infarction ([place]) 10/30/2011  . PFO (patent foramen ovale) 10/30/2011  . Bradycardia 10/30/2011  . Chest pain 10/30/2011  . Hypothyroid   . Thrombophlebitis   . Sleep apnea   . Fibromyalgia   . Status post bariatric surgery 12/07/2010  . Vein disorder 11/29/2010  . S/P total hysterectomy and bilateral salpingo-oophorectomy 11/29/2010  . S/P exploratory laparotomy 11/29/2010  . S/P cholecystectomy 11/29/2010  . S/P ACL surgery 11/29/2010  . Morbid obesity ([place]) 11/10/2010    11:04 AM, 05/13/20 Jerene Pitch, DPT Physical Therapy with [facility]  [phone] office  [facility] [address] [place], [place], [address] Phone: [phone]   Fax:  [phone]  Name: DEVITA NIES MRN: [id number] Date of Birth: 04-28-1960

## 2020-05-16 ENCOUNTER — Other Ambulatory Visit: Payer: Self-pay

## 2020-05-16 ENCOUNTER — Encounter: Payer: Self-pay | Admitting: Neurology

## 2020-05-16 ENCOUNTER — Ambulatory Visit: Payer: PPO | Admitting: Neurology

## 2020-05-16 VITALS — BP 135/75 | HR 102 | Ht 65.0 in | Wt 204.0 lb

## 2020-05-16 DIAGNOSIS — G43909 Migraine, unspecified, not intractable, without status migrainosus: Secondary | ICD-10-CM | POA: Diagnosis not present

## 2020-05-16 DIAGNOSIS — R413 Other amnesia: Secondary | ICD-10-CM | POA: Diagnosis not present

## 2020-05-16 DIAGNOSIS — G894 Chronic pain syndrome: Secondary | ICD-10-CM

## 2020-05-16 MED ORDER — NURTEC 75 MG PO TBDP: 75.0000 mg | ORAL_TABLET | Freq: Every day | ORAL | 5 refills | Status: DC | PRN

## 2020-05-16 NOTE — Progress Notes (Signed)
PATIENT: Debbie Pineda DOB: 1960-06-07  REASON FOR VISIT: follow up HISTORY FROM: patient  HISTORY OF PRESENT ILLNESS: Today 05/16/20 Debbie Pineda is a 60 year old female with history of methylenetetrahydrofolate reductase deficiency, left temporal occipital stroke, and memory issues following a stroke.  Is on Topamax for seizures.  Aricept for memory.  Aimovig for headaches doing better on 70 mg every 2 weeks, Nurtec is works well with good benefit, 1-2 days before injection is due has headaches, triggered by bright sun. Spinal stimulator in place, chronic low back pain followed with Dr. Louanne Skye. Had right bunion foot surgery in Jan 2022, is in PT. Has had elevated liver enzymes, seeing GI local and at [facility], had stent placed due to history of gastric bypass. Since surgery, complains of nerve pain to top of foot since surgery, already has fibro, already on gabapentin 800 mg 3 times daily.  Also taking oxycodone, Nucynta, Cymbalta, baclofen. Using cane.  Here today for evaluation unaccompanied.  HISTORY 11/18/2019 Dr. 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  She is on Aricept.  She has a history of seizures that is been well controlled on Topamax.  She has migraine headaches that have been significantly improved with use of Aimovig.  The patient gets 140 mg of Aimovig each month but notes that prior to her next injection the headaches seem to return.  She uses Nurtec if needed which is also effective.  She has noted that when her blood sugar goes up or she has something too sweet she gets cognitive changes from this.  The patient has history of chronic low back  pain followed by Dr. Louanne Skye.  She has a spinal stimulator in place.  She also reports some difficulty with her right knee.  The patient returns for further evaluation today.   REVIEW OF SYSTEMS: Out of a complete 14 system review of symptoms, the patient complains only of the following symptoms, and all other reviewed systems are negative.   See HPI  ALLERGIES: Allergies  Allergen Reactions  . Lyrica [Pregabalin] Shortness Of Breath and Swelling    lower extremity edema and weight gain  . Belsomra [Suvorexant] Other (See Comments)    unknown  . Morphine And Related Itching    Upper torso  . Sulfamethoxazole-Trimethoprim Itching and Rash    Bactrim  . Tape Itching and Rash    Please use "paper" tape Rash if left on longer than 24 hrs    HOME MEDICATIONS: Outpatient Medications Prior to Visit  Medication Sig Dispense Refill  . amoxicillin (AMOXIL) 500 MG capsule Take 2,000 mg by mouth See admin instructions. Take 2000 mg by mouth 1 hour prior to dental appointment    . baclofen (LIORESAL) 10 MG tablet Take 10 mg by mouth 3 (three) times daily.   1  . bisacodyl (DULCOLAX) 5 MG EC tablet Take 5 mg by mouth daily as needed for moderate constipation.    . Cyanocobalamin (VITAMIN B-12) 5000 MCG SUBL Place 5,000 mcg under the tongue daily.     . diclofenac sodium (VOLTAREN) 1 % GEL Apply 4 g topically 4 (four) times daily as needed (PAIN). 5 Tube 1  . docusate sodium (COLACE) 100 MG  capsule Take 100 mg by mouth as needed for mild constipation.    Marland Kitchen donepezil (ARICEPT) 10 MG tablet Take 1 tablet by mouth at bedtime. 90 tablet 3  . DULoxetine (CYMBALTA) 60 MG capsule Take 60 mg by mouth 2 (two) times daily.  11  . Erenumab-aooe (AIMOVIG) 70 MG/ML SOAJ Inject 70 mg into the skin every 14 (fourteen) days. 2.24 mL 5  . famotidine (PEPCID) 40 MG tablet Take 0.5 tablets (20 mg total) by mouth daily. 90 tablet 3  . fluconazole (DIFLUCAN) 150 MG tablet Take 150 mg by mouth daily as needed (yeast  infection). Finishes Sunday 02-07-2020 for yeast infection    . folic acid (FOLVITE) 242 MCG tablet Take 400 mcg by mouth daily.    Marland Kitchen gabapentin (NEURONTIN) 800 MG tablet Take 1 tablet (800 mg total) by mouth 3 (three) times daily.    . IBUPROFEN PO Take 800 mg by mouth as needed.    Javier Docker Oil 350 MG CAPS Take 350 mg by mouth at bedtime.     Marland Kitchen L-Methylfolate-B12-B6-B2 (METAFOLBIC) 06-22-48-5 MG TABS Take 1 tablet by mouth daily. 90 tablet 3  . levothyroxine (SYNTHROID) 150 MCG tablet Take 175 mcg by mouth daily before breakfast.    . methylphenidate (CONCERTA) 27 MG PO CR tablet Take 1 tablet (27 mg total) by mouth 2 (two) times daily. 60 tablet 0  . mirabegron ER (MYRBETRIQ) 50 MG TB24 tablet Take 50 mg by mouth daily.    . Multiple Minerals-Vitamins (CAL-MAG-ZINC-D PO) Take 3 tablets by mouth daily.     . Multiple Vitamins-Minerals (ONE-A-DAY WOMENS PETITES PO) Take 1 tablet by mouth 2 (two) times daily.     Marland Kitchen NARCAN 4 MG/0.1ML LIQD nasal spray kit Place 0.4 mg into the nose once.    . NUCYNTA 50 MG tablet Take 50 mg by mouth 3 (three) times daily.    Marland Kitchen omeprazole (PRILOSEC) 40 MG capsule Take 1 capsule (40 mg total) by mouth daily before breakfast. (Patient taking differently: Take 40 mg by mouth daily.) 90 capsule 3  . ondansetron (ZOFRAN-ODT) 4 MG disintegrating tablet Take 4 mg by mouth every 8 (eight) hours as needed for nausea or vomiting.    . Pseudoephedrine HCl (SUDAFED PO) Take by mouth as needed.    . rivaroxaban (XARELTO) 20 MG TABS tablet Take 1 tablet (20 mg total) by mouth at bedtime.    . Simethicone (PHAZYME ULTRA STRENGTH) 180 MG CAPS Take 1 capsule (180 mg total) by mouth 3 (three) times daily as needed.  0  . tiZANidine (ZANAFLEX) 4 MG tablet Take 1 tablet (4 mg total) by mouth every 6 (six) hours as needed for muscle spasms. 30 tablet 0  . topiramate (TOPAMAX) 100 MG tablet Take 1 tablet (100 mg total) by mouth 2 (two) times daily. 180 tablet 3  . traZODone (DESYREL) 100 MG  tablet Take 200 mg by mouth at bedtime.    . Turmeric (QC TUMERIC COMPLEX PO) Take by mouth 2 (two) times daily.    . vitamin C (ASCORBIC ACID) 500 MG tablet Take 500 mg by mouth daily.    Marland Kitchen VITAMIN D PO Take 5,000 Units by mouth daily.     . Rimegepant Sulfate (NURTEC) 75 MG TBDP Take 75 mg by mouth daily as needed. 8 tablet 2  . acetaminophen (TYLENOL) 500 MG tablet Take 1,000 mg by mouth every 6 (six) hours as needed.    . ciprofloxacin (CIPRO) 500 MG tablet Take 1 tablet (500  mg total) by mouth 2 (two) times daily. 20 tablet 0  . clindamycin (CLEOCIN) 300 MG capsule Take 1 capsule (300 mg total) by mouth 3 (three) times daily. 30 capsule 0  . collagenase (SANTYL) ointment Apply 1 application topically daily. 15 g 0  . doxycycline (VIBRA-TABS) 100 MG tablet Take 1 tablet (100 mg total) by mouth 2 (two) times daily. 20 tablet 0  . oxyCODONE-acetaminophen (PERCOCET) 10-325 MG tablet Take 1 tablet by mouth every 4 (four) hours as needed for pain. (Patient not taking: Reported on 03/02/2020) 30 tablet 0  . oxyCODONE-acetaminophen (PERCOCET) 10-325 MG tablet Take 1 tablet by mouth every 4 (four) hours as needed for pain. 15 tablet 0  . oxyCODONE-acetaminophen (PERCOCET) 10-325 MG tablet Take 1 tablet by mouth every 4 (four) hours as needed for pain. 30 tablet 0  . oxyCODONE-acetaminophen (PERCOCET) 10-325 MG tablet Take 1 tablet by mouth every 4 (four) hours as needed for pain. 30 tablet 0  . oxyCODONE-acetaminophen (PERCOCET) 5-325 MG tablet Take 1-2 tablets by mouth every 4 (four) hours as needed for severe pain. 30 tablet 0  . oxyCODONE-acetaminophen (PERCOCET) 5-325 MG tablet Take 1-2 tablets by mouth every 4 (four) hours as needed for severe pain. 30 tablet 0  . oxyCODONE-acetaminophen (PERCOCET) 5-325 MG tablet Take 1 tablet by mouth every 4 (four) hours as needed for severe pain. 30 tablet 0  . oxyCODONE-acetaminophen (PERCOCET) 5-325 MG tablet Take 1-2 tablets by mouth every 4 (four) hours as  needed for severe pain. 30 tablet 0  . oxyCODONE-acetaminophen (PERCOCET) 5-325 MG tablet Take 1-2 tablets by mouth every 4 (four) hours as needed for severe pain. 30 tablet 0  . oxyCODONE-acetaminophen (PERCOCET) 5-325 MG tablet Take 1-2 tablets by mouth every 4 (four) hours as needed for severe pain. 30 tablet 0  . solifenacin (VESICARE) 5 MG tablet at bedtime.      No facility-administered medications prior to visit.    PAST MEDICAL HISTORY: Past Medical History:  Diagnosis Date  . ADD (attention deficit disorder)   . Arthritis    "all over"  . Cerebral infarction ([place]) 10/30/2011  . Cerebrovascular disease 08/14/2016  . Chronic back pain    "all over"  . Chronic low back pain 01/11/2016  . Complication of anesthesia    tends to have hypotension when NPO and post-anesthesia  . Constipation    takes stool softener daily  . Degenerative disk disease   . Degenerative joint disease   . DVT (deep venous thrombosis) ([place]) 2014   RLE  . Family history of adverse reaction to anesthesia    a family member woke up during surgery; "think it was my mom"  . Fibromyalgia   . Generalized osteoarthritis of multiple sites 11/04/2013  . GERD (gastroesophageal reflux disease)   . Heart palpitations 12/14/2013   resolved  . History of blood clots    superficial  . Hypoglycemia   . Hypothyroid    takes Synthroid daily  . Incomplete emptying of bladder   . Insomnia    takes Trazodone nightly  . Iron deficiency anemia    takes Ferrous Sulfate daily  . Joint pain   . Joint swelling    knees and ankles  . Memory disorder 08/14/2016  . Morbid obesity ([place])   . Nausea    takes Zofran as needed.Seeing GI doc  . Neck pain 05/25/2016  . OSA on CPAP    tested more than 5 yrs ago.    . Osteoarthritis   .  Osteopenia    in feet  . PFO (patent foramen ovale)    no murmur  . Pre-diabetes   . Primary osteoarthritis of both feet 05/29/2016   Right bunionectomy August 2017 by Dr. Sharol Given  . Scoliosis    . Skin abnormality 02/04/2020   raised area on lip  . Sleep apnea    mild osa per pt  . Stroke [facility]) "several"last 2014   right foot weakness; memory issues, black spot right visual field since" (03/23/2015)  . Thrombophlebitis   . Trochanteric bursitis of both hips 05/25/2016  . Unilateral primary osteoarthritis, right knee 05/29/2016  . Urinary urgency 04/19/2016  . Vein disorder 11/29/2010   varicose veins both legs  . Wears glasses     PAST SURGICAL HISTORY: Past Surgical History:  Procedure Laterality Date  . BIOPSY  11/14/2018   Procedure: BIOPSY;  Surgeon: Rogene Houston, MD;  [facility];  Service: Endoscopy;;  esophagusgastric  . BONE EXCISION Right 08/29/2017   Procedure: right trapezium excision;  Surgeon: Daryll Brod, MD;  Location: [place];  Service: Orthopedics;  Laterality: Right;  . BUNIONECTOMY Right 08/2015  . CARDIAC CATHETERIZATION     2008.  "it was fine" (not sure why she had it done, and doesn't know where)  . CARPOMETACARPEL SUSPENSION PLASTY Right 08/29/2017   Procedure: SUSPENSION PLASTY RIGHT THUMB;  Surgeon: Daryll Brod, MD;  Location: [place];  Service: Orthopedics;  Laterality: Right;  . COLONOSCOPY N/A 03/25/2013   Procedure: COLONOSCOPY;  Surgeon: Rogene Houston, MD;  [facility];  Service: Endoscopy;  Laterality: N/A;  930  . ESOPHAGOGASTRODUODENOSCOPY    . ESOPHAGOGASTRODUODENOSCOPY (EGD) WITH PROPOFOL N/A 11/14/2018   Procedure: ESOPHAGOGASTRODUODENOSCOPY (EGD) WITH PROPOFOL;  Surgeon: Rogene Houston, MD;  [facility];  Service: Endoscopy;  Laterality: N/A;  1:55pm-office moved to 11:00am/pt notified to arrive at 9:30am per KF  . EXPLORATORY LAPAROTOMY     "took fallopian tubes out"  . HALLUX FUSION Right 02/12/2020   Procedure: HALLUX INTERPHANGEAL JOINT  FUSION;  Surgeon: Felipa Furnace, DPM;  Location: [place];  Service: Podiatry;  Laterality: Right;  .  HAMMER TOE SURGERY Right 02/12/2020   Procedure: SECOND AND THIRD HAMMER TOE CORRECTION; CAPSULOTOMY SECOND INTERPHALANGEAL JOINT;  Surgeon: Felipa Furnace, DPM;  Location: [place];  Service: Podiatry;  Laterality: Right;  . JOINT REPLACEMENT     bil knee   . KNEE ARTHROSCOPY Left   . KNEE ARTHROSCOPY W/ ACL RECONSTRUCTION Right yrs ago   "added pins"  . LAPAROSCOPIC CHOLECYSTECTOMY  ~ 2001  . ROUX-EN-Y GASTRIC BYPASS  11/20/2010   [place]  . SPINAL CORD STIMULATOR INSERTION N/A 04/18/2017   Procedure: LUMBAR SPINAL CORD STIMULATOR INSERTION;  Surgeon: Clydell Hakim, MD;  Location: [place];  Service: Neurosurgery;  Laterality: N/A;  LUMBAR SPINAL CORD STIMULATOR INSERTION  . TENDON TRANSFER Right 08/29/2017   Procedure: right abductor pollicis longus transfer;  Surgeon: Daryll Brod, MD;  Location: [place];  Service: Orthopedics;  Laterality: Right;  . TOTAL KNEE ARTHROPLASTY Left 03/23/2015   Procedure: TOTAL KNEE ARTHROPLASTY;  Surgeon: Newt Minion, MD;  Location: [place];  Service: Orthopedics;  Laterality: Left;  . TOTAL KNEE ARTHROPLASTY Right 08/15/2016   Procedure: RIGHT TOTAL KNEE ARTHROPLASTY, REMOVAL ACL SCREWS;  Surgeon: Newt Minion, MD;  Location: [place];  Service: Orthopedics;  Laterality: Right;  . TOTAL KNEE ARTHROPLASTY WITH HARDWARE REMOVAL  Right   . VAGINAL HYSTERECTOMY     tah/bso  . VARICOSE VEIN SURGERY Right X 2  . WEIL OSTEOTOMY Right 02/12/2020   Procedure: DOUBLE L OSTEOTOMY;  Surgeon: Felipa Furnace, DPM;  Location: [place];  Service: Podiatry;  Laterality: Right;    FAMILY HISTORY: Family History  Problem Relation Age of Onset  . Heart disease Father   . Cancer Father   . Parkinson's disease Father   . Cancer Mother        skin cancer   . Myasthenia gravis Mother   . Heart disease Brother   . Cancer Brother   . Diabetes Brother   . Stroke Brother   . Heart disease Sister   . Heart attack Sister   .  Cancer Maternal Grandfather   . Hypothyroidism Daughter   . Hypertension Other   . Colon cancer Neg Hx     SOCIAL HISTORY: Social History   Socioeconomic History  . Marital status: Married    Spouse name: Jeneen Rinks  . Number of children: 1  . Years of education: 28  . Highest education level: Not on file  Occupational History  . Occupation: Disability  . Occupation: formerly Therapist, sports, [employer]  Tobacco Use  . Smoking status: Former Smoker    Packs/day: 0.75    Years: 8.00    Pack years: 6.00    Types: Cigarettes    Quit date: 12/01/1990    Years since quitting: 29.4  . Smokeless tobacco: Never Used  . Tobacco comment: quit smoking in the 1990s  Vaping Use  . Vaping Use: Never used  Substance and Sexual Activity  . Alcohol use: No    Comment: 03/23/2015 "stopped drinking in 2012 w/gastric bypass; drank socially before bypass"  . Drug use: No  . Sexual activity: Not Currently    Birth control/protection: Surgical  Other Topics Concern  . Not on file  Social History Narrative   Lives with husband   Caffeine use: No soda   Mainly water, drinks decaf tea   Right handed   Social Determinants of Health   Financial Resource Strain: Not on file  Food Insecurity: Not on file  Transportation Needs: Not on file  Physical Activity: Not on file  Stress: Not on file  Social Connections: Not on file  Intimate Partner Violence: Not on file   PHYSICAL EXAM  Vitals:   05/16/20 1121  BP: 135/75  Pulse: (!) 102  Weight: 204 lb (92.5 kg)  Height: '5\' 5"'  (1.651 m)   Body mass index is 33.95 kg/m.  Generalized: Well developed, in no acute distress  MMSE - Mini Mental State Exam 05/19/2019 11/11/2018 07/11/2017  Orientation to time '4 5 5  ' Orientation to Place '5 5 5  ' Registration '3 3 3  ' Attention/ Calculation '5 3 5  ' Recall '3 3 3  ' Language- name 2 objects '2 2 2  ' Language- repeat '1 1 1  ' Language- follow 3 step command '3 3 3  ' Language- read & follow direction '1 1 1  ' Write a sentence '1 1 1   ' Copy design '1 1 1  ' Total score '29 28 30    ' Neurological examination  Mentation: Alert oriented to time, place, history taking. Follows all commands speech and language fluent Cranial nerve II-XII: Pupils were equal round reactive to light. Extraocular movements were full, but decreased right upper quad visual fields. Facial sensation and strength were normal. Head turning and shoulder shrug  were normal and symmetric.  Motor: The motor testing reveals 5 over 5 strength of all 4 extremities. Good symmetric motor tone is noted throughout.  Sensory: Sensory testing is intact to soft touch on all 4 extremities. No evidence of extinction is noted.  Coordination: Cerebellar testing reveals good finger-nose-finger and heel-to-shin bilaterally.  Gait and station: Gait is slightly wide-based, using single-point cane postop, can walk independently.  Reflexes: Deep tendon reflexes are symmetric but decreased throughout  DIAGNOSTIC DATA (LABS, IMAGING, TESTING) - I reviewed patient records, labs, notes, testing and imaging myself where available.  Lab Results  Component Value Date   WBC 5.6 10/14/2018   HGB 13.4 10/14/2018   HCT 40.6 10/14/2018   MCV 99.3 10/14/2018   PLT 215 10/14/2018      Component Value Date/Time   NA 141 10/14/2018 1302   K 4.5 10/14/2018 1302   CL 108 10/14/2018 1302   CO2 25 10/14/2018 1302   GLUCOSE 99 10/14/2018 1302   BUN 15 10/14/2018 1302   CREATININE 0.92 10/14/2018 1302   CALCIUM 9.7 10/14/2018 1302   PROT 6.1 03/02/2020 1310   ALBUMIN 3.9 03/09/2019 0932   AST 29 03/02/2020 1310   ALT 83 (H) 03/02/2020 1310   ALKPHOS 83 03/09/2019 0932   BILITOT 0.4 03/02/2020 1310   GFRNONAA 70 10/14/2018 1302   GFRAA 81 10/14/2018 1302   Lab Results  Component Value Date   CHOL 151 05/02/2017   HDL 74 05/02/2017   LDLCALC 66 05/02/2017   TRIG 54 05/02/2017   CHOLHDL 2.0 05/02/2017   Lab Results  Component Value Date   HGBA1C 5.0 05/02/2017   Lab Results   Component Value Date   VITAMINB12 1,582 (H) 08/14/2016   Lab Results  Component Value Date   TSH 4.289 05/01/2017   ASSESSMENT AND PLAN 60 y.o. year old female  has a past medical history of ADD (attention deficit disorder), Arthritis, Cerebral infarction ([place]) (10/30/2011), Cerebrovascular disease (08/14/2016), Chronic back pain, Chronic low back pain (29/51/8841), Complication of anesthesia, Constipation, Degenerative disk disease, Degenerative joint disease, DVT (deep venous thrombosis) ([place]) (2014), Family history of adverse reaction to anesthesia, Fibromyalgia, Generalized osteoarthritis of multiple sites (11/04/2013), GERD (gastroesophageal reflux disease), Heart palpitations (12/14/2013), History of blood clots, Hypoglycemia, Hypothyroid, Incomplete emptying of bladder, Insomnia, Iron deficiency anemia, Joint pain, Joint swelling, Memory disorder (08/14/2016), Morbid obesity ([place]), Nausea, Neck pain (05/25/2016), OSA on CPAP, Osteoarthritis, Osteopenia, PFO (patent foramen ovale), Pre-diabetes, Primary osteoarthritis of both feet (05/29/2016), Scoliosis, Skin abnormality (02/04/2020), Sleep apnea, Stroke ([place]) ("several"last 2014), Thrombophlebitis, Trochanteric bursitis of both hips (05/25/2016), Unilateral primary osteoarthritis, right knee (05/29/2016), Urinary urgency (04/19/2016), Vein disorder (11/29/2010), and Wears glasses. here with:  1.  History of cerebrovascular disease, left temporal occipital stroke -Continue to work with PCP to manage vascular risk factors  2.  Methylenetetrahydrofolate reductase deficiency -On vitamin supplement  3.  Migraine headache -Continue Aimovig 70 mg every 2 weeks, better control than 140 mg once monthly  -Continue Nurtec as needed for acute headache  4.  Mild memory changes, stable -MMSE was 29/30 today -Keep Aricept 10 mg daily   5.  History of seizures, well controlled -Continue Topamax 100 mg twice daily  6.  Chronic low back pain -Spinal  stimulator, sees Dr. Louanne Skye  7. Post-up right foot surgery -In PT, already taking Nucynta, oxycodone, gabapentin, Cymbalta, baclofen, PCP may raise gabapentin dosing, make sure kidney function okay, recent elevated liver enzymes, stent [facility]  Follow-up in 6 months or sooner if needed.  Written prescription given for Nurtec at her request, gets Topamax, donepezil, Aimovig from our office. Gets Aimovig from [employer], look like sent in January, will confirm with RN to ensure no lapse.   I spent 30 minutes of face-to-face and non-face-to-face time with patient.  This included previsit chart review, lab review, study review, order entry, electronic health record documentation, patient education.  Butler Denmark, AGNP-C, DNP 05/16/2020, 11:55 AM [facility] [address], [place], [place] [address] [phone]

## 2020-05-16 NOTE — Progress Notes (Signed)
I have read the note, and I agree with the clinical assessment and plan.  Latoiya Maradiaga K Lonzie Simmer   

## 2020-05-16 NOTE — Patient Instructions (Signed)
Continue current medications Will check on Aimovig to ensure no interruption  See you back

## 2020-05-17 ENCOUNTER — Encounter (HOSPITAL_COMMUNITY): Payer: PPO | Admitting: Physical Therapy

## 2020-05-17 MED ORDER — METAFOLBIC 6-1-50-5 MG PO TABS: 1.0000 | ORAL_TABLET | Freq: Every day | ORAL | 11 refills | Status: DC

## 2020-05-18 ENCOUNTER — Ambulatory Visit (INDEPENDENT_AMBULATORY_CARE_PROVIDER_SITE_OTHER): Payer: PPO | Admitting: Podiatry

## 2020-05-18 ENCOUNTER — Ambulatory Visit (INDEPENDENT_AMBULATORY_CARE_PROVIDER_SITE_OTHER): Payer: PPO

## 2020-05-18 ENCOUNTER — Other Ambulatory Visit: Payer: Self-pay

## 2020-05-18 DIAGNOSIS — T85848A Pain due to other internal prosthetic devices, implants and grafts, initial encounter: Secondary | ICD-10-CM | POA: Diagnosis not present

## 2020-05-18 DIAGNOSIS — M2011 Hallux valgus (acquired), right foot: Secondary | ICD-10-CM

## 2020-05-18 MED ORDER — METAFOLBIC PLUS 6-2-600 MG PO TABS: 1.0000 | ORAL_TABLET | Freq: Every day | ORAL | 11 refills | Status: DC

## 2020-05-18 MED ORDER — OXYCODONE-ACETAMINOPHEN 10-325 MG PO TABS: 1.0000 | ORAL_TABLET | ORAL | 0 refills | Status: DC | PRN

## 2020-05-18 NOTE — Addendum Note (Signed)
Addended by: Brandon Melnick on: 05/18/2020 09:14 AM   Modules accepted: Orders

## 2020-05-20 ENCOUNTER — Encounter: Payer: Self-pay | Admitting: Podiatry

## 2020-05-20 ENCOUNTER — Encounter (HOSPITAL_COMMUNITY): Payer: PPO

## 2020-05-20 NOTE — Progress Notes (Signed)
Subjective:  Patient ID: Debbie Pineda, female    DOB: [birth year],  MRN: [id number]  Chief Complaint  Patient presents with  . Routine Post Op    (xray)POV # DOS 02/12/2020 RT LAPIDUS FUSIONFOR CORRECTION OF BUNION W/POSSIBLE METATARSAL OSTEOTOMY AND POSSIBLE PHALANGEAL OSTEOTOMY, CORRECTION OF 2,3 HAMMERTOE RT     60 y.o. female returns for post-op check.  Patient states she is doing well.  She states that she has some pain around the hardware.  It could be the small hardware.  She does not have any more ulceration.  She denies any other acute complaints overall doing much better.  She still needs pain medication.  Review of Systems: Negative except as noted in the HPI. Denies N/V/F/Ch.  Past Medical History:  Diagnosis Date  . ADD (attention deficit disorder)   . Arthritis    "all over"  . Cerebral infarction ([place]) 10/30/2011  . Cerebrovascular disease 08/14/2016  . Chronic back pain    "all over"  . Chronic low back pain 01/11/2016  . Complication of anesthesia    tends to have hypotension when NPO and post-anesthesia  . Constipation    takes stool softener daily  . Degenerative disk disease   . Degenerative joint disease   . DVT (deep venous thrombosis) ([place]) 2014   RLE  . Family history of adverse reaction to anesthesia    a family member woke up during surgery; "think it was my mom"  . Fibromyalgia   . Generalized osteoarthritis of multiple sites 11/04/2013  . GERD (gastroesophageal reflux disease)   . Heart palpitations 12/14/2013   resolved  . History of blood clots    superficial  . Hypoglycemia   . Hypothyroid    takes Synthroid daily  . Incomplete emptying of bladder   . Insomnia    takes Trazodone nightly  . Iron deficiency anemia    takes Ferrous Sulfate daily  . Joint pain   . Joint swelling    knees and ankles  . Memory disorder 08/14/2016  . Morbid obesity ([place])   . Nausea    takes Zofran as needed.Seeing GI doc  . Neck pain 05/25/2016  . OSA on  CPAP    tested more than 5 yrs ago.    . Osteoarthritis   . Osteopenia    in feet  . PFO (patent foramen ovale)    no murmur  . Pre-diabetes   . Primary osteoarthritis of both feet 05/29/2016   Right bunionectomy August 2017 by Dr. Sharol Given  . Scoliosis   . Skin abnormality 02/04/2020   raised area on lip  . Sleep apnea    mild osa per pt  . Stroke [facility]) "several"last 2014   right foot weakness; memory issues, black spot right visual field since" (03/23/2015)  . Thrombophlebitis   . Trochanteric bursitis of both hips 05/25/2016  . Unilateral primary osteoarthritis, right knee 05/29/2016  . Urinary urgency 04/19/2016  . Vein disorder 11/29/2010   varicose veins both legs  . Wears glasses     Current Outpatient Medications:  .  oxyCODONE-acetaminophen (PERCOCET) 10-325 MG tablet, Take 1 tablet by mouth every 4 (four) hours as needed for pain., Disp: 30 tablet, Rfl: 0 .  amoxicillin (AMOXIL) 500 MG capsule, Take 2,000 mg by mouth See admin instructions. Take 2000 mg by mouth 1 hour prior to dental appointment, Disp: , Rfl:  .  baclofen (LIORESAL) 10 MG tablet, Take 10 mg by mouth 3 (three) times daily. , Disp: ,  Rfl: 1 .  bisacodyl (DULCOLAX) 5 MG EC tablet, Take 5 mg by mouth daily as needed for moderate constipation., Disp: , Rfl:  .  Cyanocobalamin (VITAMIN B-12) 5000 MCG SUBL, Place 5,000 mcg under the tongue daily. , Disp: , Rfl:  .  diclofenac sodium (VOLTAREN) 1 % GEL, Apply 4 g topically 4 (four) times daily as needed (PAIN)., Disp: 5 Tube, Rfl: 1 .  docusate sodium (COLACE) 100 MG capsule, Take 100 mg by mouth as needed for mild constipation., Disp: , Rfl:  .  donepezil (ARICEPT) 10 MG tablet, Take 1 tablet by mouth at bedtime., Disp: 90 tablet, Rfl: 3 .  DULoxetine (CYMBALTA) 60 MG capsule, Take 60 mg by mouth 2 (two) times daily., Disp: , Rfl: 11 .  Erenumab-aooe (AIMOVIG) 70 MG/ML SOAJ, Inject 70 mg into the skin every 14 (fourteen) days., Disp: 2.24 mL, Rfl: 5 .  famotidine  (PEPCID) 40 MG tablet, Take 0.5 tablets (20 mg total) by mouth daily., Disp: 90 tablet, Rfl: 3 .  fluconazole (DIFLUCAN) 150 MG tablet, Take 150 mg by mouth daily as needed (yeast infection). Finishes Sunday 02-07-2020 for yeast infection, Disp: , Rfl:  .  folic acid (FOLVITE) 856 MCG tablet, Take 400 mcg by mouth daily., Disp: , Rfl:  .  gabapentin (NEURONTIN) 800 MG tablet, Take 1 tablet (800 mg total) by mouth 3 (three) times daily., Disp: , Rfl:  .  IBUPROFEN PO, Take 800 mg by mouth as needed., Disp: , Rfl:  .  Krill Oil 350 MG CAPS, Take 350 mg by mouth at bedtime. , Disp: , Rfl:  .  levothyroxine (SYNTHROID) 150 MCG tablet, Take 175 mcg by mouth daily before breakfast., Disp: , Rfl:  .  Methylfol-Methylcob-Acetylcyst (METAFOLBIC PLUS) 6-2-600 MG TABS, Take 1 tablet by mouth daily., Disp: 30 tablet, Rfl: 11 .  methylphenidate (CONCERTA) 27 MG PO CR tablet, Take 1 tablet (27 mg total) by mouth 2 (two) times daily., Disp: 60 tablet, Rfl: 0 .  mirabegron ER (MYRBETRIQ) 50 MG TB24 tablet, Take 50 mg by mouth daily., Disp: , Rfl:  .  Multiple Minerals-Vitamins (CAL-MAG-ZINC-D PO), Take 3 tablets by mouth daily. , Disp: , Rfl:  .  Multiple Vitamins-Minerals (ONE-A-DAY WOMENS PETITES PO), Take 1 tablet by mouth 2 (two) times daily. , Disp: , Rfl:  .  NARCAN 4 MG/0.1ML LIQD nasal spray kit, Place 0.4 mg into the nose once., Disp: , Rfl:  .  NUCYNTA 50 MG tablet, Take 50 mg by mouth 3 (three) times daily., Disp: , Rfl:  .  omeprazole (PRILOSEC) 40 MG capsule, Take 1 capsule (40 mg total) by mouth daily before breakfast. (Patient taking differently: Take 40 mg by mouth daily.), Disp: 90 capsule, Rfl: 3 .  ondansetron (ZOFRAN-ODT) 4 MG disintegrating tablet, Take 4 mg by mouth every 8 (eight) hours as needed for nausea or vomiting., Disp: , Rfl:  .  Pseudoephedrine HCl (SUDAFED PO), Take by mouth as needed., Disp: , Rfl:  .  Rimegepant Sulfate (NURTEC) 75 MG TBDP, Take 75 mg by mouth daily as needed.,  Disp: 8 tablet, Rfl: 5 .  rivaroxaban (XARELTO) 20 MG TABS tablet, Take 1 tablet (20 mg total) by mouth at bedtime., Disp: , Rfl:  .  Simethicone (PHAZYME ULTRA STRENGTH) 180 MG CAPS, Take 1 capsule (180 mg total) by mouth 3 (three) times daily as needed., Disp: , Rfl: 0 .  tiZANidine (ZANAFLEX) 4 MG tablet, Take 1 tablet (4 mg total) by mouth every 6 (six)  hours as needed for muscle spasms., Disp: 30 tablet, Rfl: 0 .  topiramate (TOPAMAX) 100 MG tablet, Take 1 tablet (100 mg total) by mouth 2 (two) times daily., Disp: 180 tablet, Rfl: 3 .  traZODone (DESYREL) 100 MG tablet, Take 200 mg by mouth at bedtime., Disp: , Rfl:  .  Turmeric (QC TUMERIC COMPLEX PO), Take by mouth 2 (two) times daily., Disp: , Rfl:  .  vitamin C (ASCORBIC ACID) 500 MG tablet, Take 500 mg by mouth daily., Disp: , Rfl:  .  VITAMIN D PO, Take 5,000 Units by mouth daily. , Disp: , Rfl:   Social History   Tobacco Use  Smoking Status Former Smoker  . Packs/day: 0.75  . Years: 8.00  . Pack years: 6.00  . Types: Cigarettes  . Quit date: 12/01/1990  . Years since quitting: 29.4  Smokeless Tobacco Never Used  Tobacco Comment   quit smoking in the 1990s    Allergies  Allergen Reactions  . Lyrica [Pregabalin] Shortness Of Breath and Swelling    lower extremity edema and weight gain  . Belsomra [Suvorexant] Other (See Comments)    unknown  . Morphine And Related Itching    Upper torso  . Sulfamethoxazole-Trimethoprim Itching and Rash    Bactrim  . Tape Itching and Rash    Please use "paper" tape Rash if left on longer than 24 hrs   Objective:  There were no vitals filed for this visit. There is no height or weight on file to calculate BMI. Constitutional Well developed. Well nourished.  Vascular Foot warm and well perfused. Capillary refill normal to all digits.   Neurologic Normal speech. Oriented to person, place, and time. Epicritic sensation to light touch grossly present bilaterally.  Dermatologic   skin completely epithelialized.  No further ulceration noted.  No clinical signs of infection noted.  Pain on palpation to the hardware site.  No range of motion of the left first tarsometatarsal joint secondary to fusion.  Good range of motion noted at the first metatarsophalangeal joint  Orthopedic:  Mild tenderness to palpation noted about the surgical site.   Radiographs: 3 views of skeletally mature the right foot.  No hardware loosening or breaking noted.  Good correction alignment noted.  The pins are intact.  Assessment:   1. Pain from implanted hardware, initial encounter    Plan:  Patient was evaluated and treated and all questions answered.  S/p foot surgery right -Progressing as expected post-operatively. -XR: See above -WB Status: Partial weightbearing to the heel right lower extremity with a cam boot -Sutures: Removed and pins were removed.  No signs of dehiscence noted.  No complication noted. -Medications: None -Continue physical therapy to help return to normal range of motion to the right lower extremity.  Right painful orthopedic hardware -I explained patient the etiology of painful orthopedic hardware.  For now patient would like to manage it conservatively however if her pain gets worse we will plan on getting a CT scan to to assess the fusion prior to removal of the orthopedic hardware.  If there is a new nonunion present I discussed with the patient that we may need to correct that as well.  She states understanding.  No follow-ups on file.

## 2020-05-23 ENCOUNTER — Ambulatory Visit: Payer: PPO | Admitting: Rheumatology

## 2020-05-24 NOTE — Progress Notes (Signed)
Office Visit Note  Patient: Debbie Pineda             Date of Birth: 1960-04-07           MRN: [id number]             PCP: Jake Samples, PA-C Referring: Jake Samples, [place]* Visit Date: 06/07/2020 Occupation: @GUAROCC @  Subjective:  Neck pain.   History of Present Illness: Debbie Pineda is a 60 y.o. female with history of osteoarthritis, degenerative disc disease and fibromyalgia syndrome.  She states she continues to have pain and discomfort in multiple joints.  She is having a lot of discomfort in the trapezius region and would like to have a trigger point injection in the left trapezius area.  She also has some discomfort in her knee joints and her feet.  She continues to have discomfort in her upper and lower back for which she has been seeing Dr. Louanne Skye.  He has tried physical therapy in the past which was helpful.  She continues to have off-and-on flares from fibromyalgia.  Activities of Daily Living:  Patient reports morning stiffness for 30-60  minutes.   Patient Reports nocturnal pain.  Difficulty dressing/grooming: Denies Difficulty climbing stairs: Denies Difficulty getting out of chair: Denies Difficulty using hands for taps, buttons, cutlery, and/or writing: Denies  Review of Systems  Constitutional: Positive for fatigue. Negative for night sweats, weight gain and weight loss.  HENT: Positive for mouth dryness and nose dryness. Negative for mouth sores, trouble swallowing and trouble swallowing.   Eyes: Positive for pain, itching and dryness. Negative for redness and visual disturbance.  Respiratory: Negative for cough, shortness of breath and difficulty breathing.   Cardiovascular: Negative for chest pain, palpitations, hypertension, irregular heartbeat and swelling in legs/feet.  Gastrointestinal: Positive for constipation. Negative for blood in stool and diarrhea.  Endocrine: Negative for increased urination.  Genitourinary: Positive for involuntary  urination and vaginal dryness. Negative for difficulty urinating.  Musculoskeletal: Positive for arthralgias, joint pain, myalgias, morning stiffness, muscle tenderness and myalgias. Negative for joint swelling and muscle weakness.  Skin: Negative for color change, rash, hair loss, redness, skin tightness, ulcers and sensitivity to sunlight.  Allergic/Immunologic: Negative for susceptible to infections.  Neurological: Positive for headaches and weakness. Negative for dizziness, numbness, memory loss and night sweats.  Hematological: Negative for bruising/bleeding tendency and swollen glands.  Psychiatric/Behavioral: Negative for depressed mood and sleep disturbance. The patient is not nervous/anxious.     PMFS History:  Patient Active Problem List   Diagnosis Date Noted  . LFTs abnormal 03/02/2020  . 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 GERD (gastroesophageal reflux disease) 09/22/2019  . SSBE (short-segment Barrett's esophagus) 09/22/2019  . Flatulence 09/22/2019  . Migraine 11/11/2018  . Abdominal pain, epigastric 10/14/2018  . LUQ pain 10/14/2018  . Attention deficit hyperactivity disorder (ADHD) 01/12/2018  . Insomnia 01/12/2018  . Elevated liver enzymes 09/10/2017  . History of diabetes mellitus 06/06/2017  . Primary osteoarthritis of right hand 06/06/2017  . Transaminasemia   . TIA (transient ischemic attack) 05/01/2017  . Dysphasia 05/01/2017  . Chronic pain syndrome 05/01/2017  . Confusion   . Presence of right artificial knee joint 09/17/2016  . H/O total knee replacement, right 08/15/2016  . Presence of retained hardware   . Memory disorder 08/14/2016  . Cerebrovascular disease 08/14/2016  . Unilateral primary osteoarthritis, right knee 05/29/2016  . Primary osteoarthritis of both feet 05/29/2016  . Trochanteric bursitis of both hips  05/25/2016  . Neck pain 05/25/2016  . Urinary urgency 04/19/2016  . Chronic low back pain  01/11/2016  . Depression 11/29/2015  . 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 Heart palpitations 12/14/2013  . Generalized osteoarthritis of multiple sites 11/04/2013  . Cerebral infarction ([place]) 10/30/2011  . PFO (patent foramen ovale) 10/30/2011  . Bradycardia 10/30/2011  . Chest pain 10/30/2011  . Hypothyroid   . Thrombophlebitis   . Sleep apnea   . Fibromyalgia   . Status post bariatric surgery 12/07/2010  . Vein disorder 11/29/2010  . S/P total hysterectomy and bilateral salpingo-oophorectomy 11/29/2010  . S/P exploratory laparotomy 11/29/2010  . S/P cholecystectomy 11/29/2010  . S/P ACL surgery 11/29/2010  . Morbid obesity ([place]) 11/10/2010    Past Medical History:  Diagnosis Date  . ADD (attention deficit disorder)   . Arthritis    "all over"  . Cerebral infarction ([place]) 10/30/2011  . Cerebrovascular disease 08/14/2016  . Chronic back pain    "all over"  . Chronic low back pain 01/11/2016  . Complication of anesthesia    tends to have hypotension when NPO and post-anesthesia  . Constipation    takes stool softener daily  . Degenerative disk disease   . Degenerative joint disease   . DVT (deep venous thrombosis) ([place]) 2014   RLE  . Family history of adverse reaction to anesthesia    a family member woke up during surgery; "think it was my mom"  . Fibromyalgia   . Generalized osteoarthritis of multiple sites 11/04/2013  . GERD (gastroesophageal reflux disease)   . Heart palpitations 12/14/2013   resolved  . History of blood clots    superficial  . Hypoglycemia   . Hypothyroid    takes Synthroid daily  . Incomplete emptying of bladder   . Insomnia    takes Trazodone nightly  . Iron deficiency anemia    takes Ferrous Sulfate daily  . Joint pain   . Joint swelling    knees and ankles  . Memory disorder 08/14/2016  . Morbid obesity ([place])   . Nausea    takes Zofran as needed.Seeing GI doc  . Neck pain 05/25/2016  . OSA on CPAP    tested more than 5 yrs  ago.    . Osteoarthritis   . Osteopenia    in feet  . PFO (patent foramen ovale)    no murmur  . Pre-diabetes   . Primary osteoarthritis of both feet 05/29/2016   Right bunionectomy August 2017 by Dr. Sharol Given  . Scoliosis   . Skin abnormality 02/04/2020   raised area on lip  . Sleep apnea    mild osa per pt  . Stroke [facility]) "several"last 2014   right foot weakness; memory issues, black spot right visual field since" (03/23/2015)  . Thrombophlebitis   . Trochanteric bursitis of both hips 05/25/2016  . Unilateral primary osteoarthritis, right knee 05/29/2016  . Urinary urgency 04/19/2016  . Vein disorder 11/29/2010   varicose veins both legs  . Wears glasses     Family History  Problem Relation Age of Onset  . Heart disease Father   . Cancer Father   . Parkinson's disease Father   . Cancer Mother        skin cancer   . Myasthenia gravis Mother   . Breast cancer Mother   . Heart disease Brother   . Cancer Brother   . Diabetes Brother   . Stroke Brother   . Heart disease Sister   .  Heart attack Sister   . Cancer Maternal Grandfather   . Hypothyroidism Daughter   . Hypertension Other   . Colon cancer Neg Hx    Past Surgical History:  Procedure Laterality Date  . BIOPSY  11/14/2018   Procedure: BIOPSY;  Surgeon: Rogene Houston, MD;  [facility];  Service: Endoscopy;;  esophagusgastric  . BONE EXCISION Right 08/29/2017   Procedure: right trapezium excision;  Surgeon: Daryll Brod, MD;  Location: [place];  Service: Orthopedics;  Laterality: Right;  . BUNIONECTOMY Right 08/2015  . CARDIAC CATHETERIZATION     2008.  "it was fine" (not sure why she had it done, and doesn't know where)  . CARPOMETACARPEL SUSPENSION PLASTY Right 08/29/2017   Procedure: SUSPENSION PLASTY RIGHT THUMB;  Surgeon: Daryll Brod, MD;  Location: [facility];  Service: Orthopedics;  Laterality: Right;  . COLONOSCOPY N/A 03/25/2013   Procedure: COLONOSCOPY;  Surgeon: Rogene Houston, MD;  [facility];  Service: Endoscopy;  Laterality: N/A;  930  . ERCP  06/02/2020  . ESOPHAGOGASTRODUODENOSCOPY    . ESOPHAGOGASTRODUODENOSCOPY (EGD) WITH PROPOFOL N/A 11/14/2018   Procedure: ESOPHAGOGASTRODUODENOSCOPY (EGD) WITH PROPOFOL;  Surgeon: Rogene Houston, MD;  [facility];  Service: Endoscopy;  Laterality: N/A;  1:55pm-office moved to 11:00am/pt notified to arrive at 9:30am per KF  . EXPLORATORY LAPAROTOMY     "took fallopian tubes out"  . HALLUX FUSION Right 02/12/2020   Procedure: HALLUX INTERPHANGEAL JOINT  FUSION;  Surgeon: Felipa Furnace, DPM;  Location: [facility];  Service: Podiatry;  Laterality: Right;  . HAMMER TOE SURGERY Right 02/12/2020   Procedure: SECOND AND THIRD HAMMER TOE CORRECTION; CAPSULOTOMY SECOND INTERPHALANGEAL JOINT;  Surgeon: Felipa Furnace, DPM;  Location: [place];  Service: Podiatry;  Laterality: Right;  . JOINT REPLACEMENT     bil knee   . KNEE ARTHROSCOPY Left   . KNEE ARTHROSCOPY W/ ACL RECONSTRUCTION Right yrs ago   "added pins"  . LAPAROSCOPIC CHOLECYSTECTOMY  ~ 2001  . ROUX-EN-Y GASTRIC BYPASS  11/20/2010   [place]  . SPINAL CORD STIMULATOR INSERTION N/A 04/18/2017   Procedure: LUMBAR SPINAL CORD STIMULATOR INSERTION;  Surgeon: Clydell Hakim, MD;  Location: [facility];  Service: Neurosurgery;  Laterality: N/A;  LUMBAR SPINAL CORD STIMULATOR INSERTION  . stomach stent  04/28/2020  . TENDON TRANSFER Right 08/29/2017   Procedure: right abductor pollicis longus transfer;  Surgeon: Daryll Brod, MD;  Location: [place];  Service: Orthopedics;  Laterality: Right;  . TOTAL KNEE ARTHROPLASTY Left 03/23/2015   Procedure: TOTAL KNEE ARTHROPLASTY;  Surgeon: Newt Minion, MD;  Location: [facility];  Service: Orthopedics;  Laterality: Left;  . TOTAL KNEE ARTHROPLASTY Right 08/15/2016   Procedure: RIGHT TOTAL KNEE ARTHROPLASTY, REMOVAL ACL SCREWS;  Surgeon: Newt Minion, MD;  Location:  [place];  Service: Orthopedics;  Laterality: Right;  . TOTAL KNEE ARTHROPLASTY WITH HARDWARE REMOVAL Right   . VAGINAL HYSTERECTOMY     tah/bso  . VARICOSE VEIN SURGERY Right X 2  . WEIL OSTEOTOMY Right 02/12/2020   Procedure: DOUBLE L OSTEOTOMY;  Surgeon: Felipa Furnace, DPM;  Location: [place];  Service: Podiatry;  Laterality: Right;   Social History   Social History Narrative   Lives with husband   Caffeine use: No soda   Mainly water, drinks decaf tea   Right handed   Immunization History  Administered Date(s) Administered  . Influenza  Split 11/01/2011, 08/23/2014  . Influenza-Unspecified 08/23/2014, 11/22/2017  . Moderna Sars-Covid-2 Vaccination 03/16/2019, 04/13/2019  . Pneumococcal Polysaccharide-23 04/26/2017  . Zoster Recombinat (Shingrix) 07/02/2017, 01/08/2018     Objective: Vital Signs: BP 103/68 (BP Location: Left Arm, Patient Position: Sitting, Cuff Size: Large)   Pulse (!) 112   Ht 5\' 5"  (1.651 m)   Wt 202 lb 6.4 oz (91.8 kg)   BMI 33.68 kg/m    Physical Exam Vitals and nursing note reviewed.  Constitutional:      Appearance: She is well-developed.  HENT:     Head: Normocephalic and atraumatic.  Eyes:     Conjunctiva/sclera: Conjunctivae normal.  Cardiovascular:     Rate and Rhythm: Normal rate and regular rhythm.     Heart sounds: Normal heart sounds.  Pulmonary:     Effort: Pulmonary effort is normal.     Breath sounds: Normal breath sounds.  Abdominal:     General: Bowel sounds are normal.     Palpations: Abdomen is soft.  Musculoskeletal:     Cervical back: Normal range of motion.  Lymphadenopathy:     Cervical: No cervical adenopathy.  Skin:    General: Skin is warm and dry.     Capillary Refill: Capillary refill takes less than 2 seconds.  Neurological:     Mental Status: She is alert and oriented to person, place, and time.  Psychiatric:        Behavior: Behavior normal.      Musculoskeletal Exam: She had limited  painful range of motion of cervical spine.  Shoulder joints, elbow joints, wrist joints were in good range of motion.  She had no PIP or DIP swelling.  She had thickening of the right fourth flexor tendon.  Hip joints, knee joints, ankles with good range of motion.  No tenderness or synovitis was noted.  She had tenderness on palpation of bilateral trochanteric bursa.  CDAI Exam: CDAI Score: -- Patient Global: --; Provider Global: -- Swollen: --; Tender: -- Joint Exam 06/07/2020   No joint exam has been documented for this visit   There is currently no information documented on the homunculus. Go to the Rheumatology activity and complete the homunculus joint exam.  Investigation: No additional findings.  Imaging: DG Foot Complete Right  Result Date: 05/18/2020 Please see detailed radiograph report in office note.   Recent Labs: Lab Results  Component Value Date   WBC 5.6 10/14/2018   HGB 13.4 10/14/2018   PLT 215 10/14/2018   NA 141 10/14/2018   K 4.5 10/14/2018   CL 108 10/14/2018   CO2 25 10/14/2018   GLUCOSE 99 10/14/2018   BUN 15 10/14/2018   CREATININE 0.92 10/14/2018   BILITOT 0.4 03/02/2020   ALKPHOS 83 03/09/2019   AST 29 03/02/2020   ALT 83 (H) 03/02/2020   PROT 6.1 03/02/2020   ALBUMIN 3.9 03/09/2019   CALCIUM 9.7 10/14/2018   GFRAA 81 10/14/2018    Speciality Comments: No specialty comments available.  Procedures:  Trigger Point Inj  Date/Time: 06/07/2020 12:00 PM Performed by: Bo Merino, MD Authorized by: Bo Merino, MD   Consent Given by:  Patient Site marked: the procedure site was marked   Timeout: prior to procedure the correct patient, procedure, and site was verified   Indications:  Muscle spasm and pain Total # of Trigger Points:  1 Location: neck   Needle Size:  27 G Approach:  Dorsal Medications #1:  0.5 mL lidocaine 1 %; 10 mg triamcinolone acetonide  40 MG/ML Patient tolerance:  Patient tolerated the procedure well with  no immediate complications   Allergies: Lyrica [pregabalin], Belsomra [suvorexant], Morphine and related, Sulfamethoxazole-trimethoprim, and Tape   Assessment / Plan:     Visit Diagnoses: Fibromyalgia-she continues to have generalized pain and discomfort.  She has positive tender points and hyperalgesia.  Need for regular exercise was emphasized.  Use of water aerobics and physical therapy was discussed.  She would like to avoid physical therapy at this point.  Trapezius muscle spasm-she has left trapezius spasm.  Bed request left trapezius area was injected with the cortisone as described above.  She tolerated the procedure well.  Right trigger ring finger-she has been having intermittent locking sensation in left ring finger.  I discussed possible use of cortisone injection in the future if she has persistent symptoms.  Primary osteoarthritis of both hands-joint protection muscle strengthening was discussed.  Trochanteric bursitis of both hips-she continues to have some off-and-on discomfort in the trochanteric area.  IT band stretches were discussed.  History of total bilateral knee replacement-doing well she still have some discomfort with walking.  Primary osteoarthritis of both feet-she continues to have some discomfort in her feet.  DDD (degenerative disc disease), thoracic-chronic pain  DDD (degenerative disc disease), lumbar-chronic pain, followed by Dr. Louanne Skye.  Other medical problems are listed as follows:  History of diabetes mellitus  History of thrombophlebitis  History of cerebral infarction  PFO (patent foramen ovale)  History of gastric bypass  History of sleep apnea  Orders: Orders Placed This Encounter  Procedures  . Trigger Point Inj   No orders of the defined types were placed in this encounter.    Follow-Up Instructions: Return in about 6 months (around 12/08/2020) for FMS, OA.   Bo Merino, MD  Note - This record has been created using  Editor, commissioning.  Chart creation errors have been sought, but may not always  have been located. Such creation errors do not reflect on  the standard of medical care.

## 2020-05-26 ENCOUNTER — Other Ambulatory Visit: Payer: Self-pay | Admitting: Podiatry

## 2020-05-26 MED ORDER — OXYCODONE-ACETAMINOPHEN 10-325 MG PO TABS: 1.0000 | ORAL_TABLET | ORAL | 0 refills | Status: DC | PRN

## 2020-06-02 DIAGNOSIS — Z79890 Hormone replacement therapy (postmenopausal): Secondary | ICD-10-CM | POA: Diagnosis not present

## 2020-06-02 DIAGNOSIS — F988 Other specified behavioral and emotional disorders with onset usually occurring in childhood and adolescence: Secondary | ICD-10-CM | POA: Diagnosis not present

## 2020-06-02 DIAGNOSIS — Z79899 Other long term (current) drug therapy: Secondary | ICD-10-CM | POA: Diagnosis not present

## 2020-06-02 DIAGNOSIS — G8929 Other chronic pain: Secondary | ICD-10-CM | POA: Diagnosis not present

## 2020-06-02 DIAGNOSIS — D369 Benign neoplasm, unspecified site: Secondary | ICD-10-CM | POA: Diagnosis not present

## 2020-06-02 DIAGNOSIS — M159 Polyosteoarthritis, unspecified: Secondary | ICD-10-CM | POA: Diagnosis not present

## 2020-06-02 DIAGNOSIS — K219 Gastro-esophageal reflux disease without esophagitis: Secondary | ICD-10-CM | POA: Diagnosis not present

## 2020-06-02 DIAGNOSIS — Z9884 Bariatric surgery status: Secondary | ICD-10-CM | POA: Diagnosis not present

## 2020-06-02 DIAGNOSIS — M797 Fibromyalgia: Secondary | ICD-10-CM | POA: Diagnosis not present

## 2020-06-02 DIAGNOSIS — Z90710 Acquired absence of both cervix and uterus: Secondary | ICD-10-CM | POA: Diagnosis not present

## 2020-06-02 DIAGNOSIS — F341 Dysthymic disorder: Secondary | ICD-10-CM | POA: Diagnosis not present

## 2020-06-02 DIAGNOSIS — K838 Other specified diseases of biliary tract: Secondary | ICD-10-CM | POA: Diagnosis not present

## 2020-06-02 DIAGNOSIS — E039 Hypothyroidism, unspecified: Secondary | ICD-10-CM | POA: Diagnosis not present

## 2020-06-02 DIAGNOSIS — Z9049 Acquired absence of other specified parts of digestive tract: Secondary | ICD-10-CM | POA: Diagnosis not present

## 2020-06-02 DIAGNOSIS — F418 Other specified anxiety disorders: Secondary | ICD-10-CM | POA: Diagnosis not present

## 2020-06-02 DIAGNOSIS — I6930 Unspecified sequelae of cerebral infarction: Secondary | ICD-10-CM | POA: Diagnosis not present

## 2020-06-02 DIAGNOSIS — D135 Benign neoplasm of extrahepatic bile ducts: Secondary | ICD-10-CM | POA: Diagnosis not present

## 2020-06-02 DIAGNOSIS — K8050 Calculus of bile duct without cholangitis or cholecystitis without obstruction: Secondary | ICD-10-CM | POA: Diagnosis not present

## 2020-06-02 DIAGNOSIS — K317 Polyp of stomach and duodenum: Secondary | ICD-10-CM | POA: Diagnosis not present

## 2020-06-02 DIAGNOSIS — G4700 Insomnia, unspecified: Secondary | ICD-10-CM | POA: Diagnosis not present

## 2020-06-02 DIAGNOSIS — Z86718 Personal history of other venous thrombosis and embolism: Secondary | ICD-10-CM | POA: Diagnosis not present

## 2020-06-02 DIAGNOSIS — Z978 Presence of other specified devices: Secondary | ICD-10-CM | POA: Diagnosis not present

## 2020-06-02 DIAGNOSIS — R208 Other disturbances of skin sensation: Secondary | ICD-10-CM | POA: Diagnosis not present

## 2020-06-02 DIAGNOSIS — M549 Dorsalgia, unspecified: Secondary | ICD-10-CM | POA: Diagnosis not present

## 2020-06-02 HISTORY — PX: ERCP: SHX60

## 2020-06-03 ENCOUNTER — Telehealth: Payer: Self-pay | Admitting: Podiatry

## 2020-06-03 ENCOUNTER — Other Ambulatory Visit: Payer: Self-pay | Admitting: Podiatry

## 2020-06-03 MED ORDER — OXYCODONE-ACETAMINOPHEN 5-325 MG PO TABS: 1.0000 | ORAL_TABLET | ORAL | 0 refills | Status: DC | PRN

## 2020-06-03 NOTE — Telephone Encounter (Signed)
Patient has requested refill on pain medications Percocet 10/325, Please Advise  Please send to [facility] in [place]

## 2020-06-06 ENCOUNTER — Telehealth: Payer: Self-pay | Admitting: Neurology

## 2020-06-06 MED ORDER — AIMOVIG 70 MG/ML ~~LOC~~ SOAJ
70.0000 mg | SUBCUTANEOUS | 11 refills | Status: DC
Start: 2020-06-06 — End: 2020-11-15

## 2020-06-06 NOTE — Telephone Encounter (Signed)
Called pt LMVM for her to call me.  She is getting aimovig thry PAP,  Not pill pack normally.

## 2020-06-06 NOTE — Telephone Encounter (Signed)
Spoke to patient she wanted a prescription to go to [facility] patient assistance [phone] fax #[phone]. She is out from 06-05-20.  I relayed will try to do this for her but will not promise this.  Will let her know when done.  SS.NP gone and will need to print and then fax.

## 2020-06-06 NOTE — Telephone Encounter (Signed)
Pt request refill Erenumab-aooe (AIMOVIG) 13 MG/ML SOAJ at [facility] by [employer].  You can call them to have them fill prescription, I am out of medication. Can you make the prescription for a 1 year supply.  Would like a call from the nurse.

## 2020-06-06 NOTE — Telephone Encounter (Signed)
Pt called, returned your phone call. Would like a call back.

## 2020-06-07 ENCOUNTER — Encounter: Payer: Self-pay | Admitting: Rheumatology

## 2020-06-07 ENCOUNTER — Other Ambulatory Visit: Payer: Self-pay

## 2020-06-07 ENCOUNTER — Ambulatory Visit: Payer: PPO | Admitting: Rheumatology

## 2020-06-07 VITALS — BP 103/68 | HR 112 | Ht 65.0 in | Wt 202.4 lb

## 2020-06-07 DIAGNOSIS — M7062 Trochanteric bursitis, left hip: Secondary | ICD-10-CM

## 2020-06-07 DIAGNOSIS — M5136 Other intervertebral disc degeneration, lumbar region: Secondary | ICD-10-CM

## 2020-06-07 DIAGNOSIS — M7061 Trochanteric bursitis, right hip: Secondary | ICD-10-CM

## 2020-06-07 DIAGNOSIS — M5134 Other intervertebral disc degeneration, thoracic region: Secondary | ICD-10-CM

## 2020-06-07 DIAGNOSIS — M65341 Trigger finger, right ring finger: Secondary | ICD-10-CM

## 2020-06-07 DIAGNOSIS — M19041 Primary osteoarthritis, right hand: Secondary | ICD-10-CM | POA: Diagnosis not present

## 2020-06-07 DIAGNOSIS — Z96653 Presence of artificial knee joint, bilateral: Secondary | ICD-10-CM | POA: Diagnosis not present

## 2020-06-07 DIAGNOSIS — Z8673 Personal history of transient ischemic attack (TIA), and cerebral infarction without residual deficits: Secondary | ICD-10-CM | POA: Diagnosis not present

## 2020-06-07 DIAGNOSIS — Z8672 Personal history of thrombophlebitis: Secondary | ICD-10-CM

## 2020-06-07 DIAGNOSIS — M62838 Other muscle spasm: Secondary | ICD-10-CM

## 2020-06-07 DIAGNOSIS — M19042 Primary osteoarthritis, left hand: Secondary | ICD-10-CM

## 2020-06-07 DIAGNOSIS — Z8669 Personal history of other diseases of the nervous system and sense organs: Secondary | ICD-10-CM

## 2020-06-07 DIAGNOSIS — Q211 Atrial septal defect: Secondary | ICD-10-CM

## 2020-06-07 DIAGNOSIS — M19071 Primary osteoarthritis, right ankle and foot: Secondary | ICD-10-CM | POA: Diagnosis not present

## 2020-06-07 DIAGNOSIS — M19072 Primary osteoarthritis, left ankle and foot: Secondary | ICD-10-CM

## 2020-06-07 DIAGNOSIS — Z8639 Personal history of other endocrine, nutritional and metabolic disease: Secondary | ICD-10-CM | POA: Diagnosis not present

## 2020-06-07 DIAGNOSIS — M797 Fibromyalgia: Secondary | ICD-10-CM

## 2020-06-07 DIAGNOSIS — Z9884 Bariatric surgery status: Secondary | ICD-10-CM

## 2020-06-07 DIAGNOSIS — Q2112 Patent foramen ovale: Secondary | ICD-10-CM

## 2020-06-07 MED ORDER — LIDOCAINE HCL 1 % IJ SOLN
0.5000 mL | INTRAMUSCULAR | Status: AC | PRN
  Administered 2020-06-07: .5 mL

## 2020-06-07 MED ORDER — TRIAMCINOLONE ACETONIDE 40 MG/ML IJ SUSP
10.0000 mg | INTRAMUSCULAR | Status: AC | PRN
  Administered 2020-06-07: 10 mg via INTRAMUSCULAR

## 2020-06-07 NOTE — Telephone Encounter (Signed)
I called and let her know that did receive fax confirmation to PAP FDN # [id number], [phone] ofv.

## 2020-06-09 DIAGNOSIS — M791 Myalgia, unspecified site: Secondary | ICD-10-CM | POA: Diagnosis not present

## 2020-06-09 DIAGNOSIS — M1711 Unilateral primary osteoarthritis, right knee: Secondary | ICD-10-CM | POA: Diagnosis not present

## 2020-06-09 DIAGNOSIS — T402X5S Adverse effect of other opioids, sequela: Secondary | ICD-10-CM | POA: Diagnosis not present

## 2020-06-09 DIAGNOSIS — Z6834 Body mass index (BMI) 34.0-34.9, adult: Secondary | ICD-10-CM | POA: Diagnosis not present

## 2020-06-09 DIAGNOSIS — F1120 Opioid dependence, uncomplicated: Secondary | ICD-10-CM | POA: Diagnosis not present

## 2020-06-09 DIAGNOSIS — E6609 Other obesity due to excess calories: Secondary | ICD-10-CM | POA: Diagnosis not present

## 2020-06-09 DIAGNOSIS — M797 Fibromyalgia: Secondary | ICD-10-CM | POA: Diagnosis not present

## 2020-06-21 DIAGNOSIS — M797 Fibromyalgia: Secondary | ICD-10-CM | POA: Diagnosis not present

## 2020-06-21 DIAGNOSIS — E7849 Other hyperlipidemia: Secondary | ICD-10-CM | POA: Diagnosis not present

## 2020-06-21 DIAGNOSIS — E063 Autoimmune thyroiditis: Secondary | ICD-10-CM | POA: Diagnosis not present

## 2020-06-28 NOTE — Progress Notes (Signed)
Date:  06/28/2020   ID:  Debbie Pineda, DOB Aug 29, 1960, MRN [id number]   PCP:  Jake Samples, PA-C  Cardiologist:  Jenkins Rouge, MD New to me  Electrophysiologist:  None   Evaluation Performed:  Follow-Up Visit  Chief Complaint:  PFO  History of Present Illness:    60 y.o. history of factor V Leiden with CVA on chronic xarelto. Some residual right sided weakness ? 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 Lives with husband and cares For her mother Has one daughter lives locally with two grand kids ages 79 and 57   BP has been on low side HR up today but ECG shows NSR rate 87 no acute changes   Past Medical History:  Diagnosis Date   ADD (attention deficit disorder)    Arthritis    "all over"   Cerebral infarction ([place]) 10/30/2011   Cerebrovascular disease 08/14/2016   Chronic back pain    "all over"   Chronic low back pain 96/29/5284   Complication of anesthesia    tends to have hypotension when NPO and post-anesthesia   Constipation    takes stool softener daily   Degenerative disk disease    Degenerative joint disease    DVT (deep venous thrombosis) ([place]) 2014   RLE   Family history of adverse reaction to anesthesia    a family member woke up during surgery; "think it was my mom"   Fibromyalgia    Generalized osteoarthritis of multiple sites 11/04/2013   GERD (gastroesophageal reflux disease)    Heart palpitations 12/14/2013   resolved   History of blood clots    superficial   Hypoglycemia    Hypothyroid    takes Synthroid daily   Incomplete emptying of bladder    Insomnia    takes Trazodone nightly   Iron deficiency anemia    takes Ferrous Sulfate daily    Joint pain    Joint swelling    knees and ankles   Memory disorder 08/14/2016   Morbid obesity (HCC)    Nausea    takes Zofran as needed.Seeing GI doc   Neck pain 05/25/2016   OSA on CPAP    tested more than 5 yrs ago.     Osteoarthritis    Osteopenia    in feet   PFO (patent foramen ovale)    no murmur   Pre-diabetes    Primary osteoarthritis of both feet 05/29/2016   Right bunionectomy August 2017 by Dr. Sharol Given   Scoliosis    Skin abnormality 02/04/2020   raised area on lip   Sleep apnea    mild osa per pt   Stroke [facility]) "several"last 2014   right foot weakness; memory issues, black spot right visual field since" (03/23/2015)   Thrombophlebitis    Trochanteric bursitis of both hips 05/25/2016   Unilateral primary osteoarthritis, right knee 05/29/2016   Urinary urgency 04/19/2016   Vein disorder 11/29/2010   varicose veins both legs   Wears glasses    Past Surgical History:  Procedure Laterality Date   BIOPSY  11/14/2018   Procedure: BIOPSY;  Surgeon: Rogene Houston, MD;  [facility];  Service: Endoscopy;;  esophagusgastric   BONE EXCISION Right 08/29/2017   Procedure: right trapezium excision;  Surgeon: Daryll Brod, MD;  Location: [place];  Service: Orthopedics;  Laterality: Right;   BUNIONECTOMY Right 08/2015   CARDIAC CATHETERIZATION     2008.  "it was fine" (not sure why she had it done, and doesn't know where)   CARPOMETACARPEL SUSPENSION PLASTY Right 08/29/2017   Procedure: SUSPENSION PLASTY RIGHT THUMB;  Surgeon: Daryll Brod, MD;  Location: [place];  Service: Orthopedics;  Laterality: Right;   COLONOSCOPY N/A 03/25/2013   Procedure: COLONOSCOPY;  Surgeon: Rogene Houston, MD;  [facility];  Service: Endoscopy;  Laterality: N/A;  930   ERCP  06/02/2020   ESOPHAGOGASTRODUODENOSCOPY     ESOPHAGOGASTRODUODENOSCOPY (EGD) WITH PROPOFOL N/A 11/14/2018   Procedure: ESOPHAGOGASTRODUODENOSCOPY (EGD) WITH PROPOFOL;  Surgeon:  Rogene Houston, MD;  [facility];  Service: Endoscopy;  Laterality: N/A;  1:55pm-office moved to 11:00am/pt notified to arrive at 9:30am per [place]     "took fallopian tubes out"   [place] Right 02/12/2020   Procedure: HALLUX INTERPHANGEAL JOINT  FUSION;  Surgeon: Felipa Furnace, DPM;  Location: [place];  Service: Podiatry;  Laterality: Right;   HAMMER TOE SURGERY Right 02/12/2020   Procedure: SECOND AND THIRD HAMMER TOE CORRECTION; CAPSULOTOMY SECOND INTERPHALANGEAL JOINT;  Surgeon: Felipa Furnace, DPM;  Location: [place];  Service: Podiatry;  Laterality: Right;   JOINT REPLACEMENT     bil knee    KNEE ARTHROSCOPY Left    KNEE ARTHROSCOPY W/ ACL RECONSTRUCTION Right yrs ago   "added pins"   LAPAROSCOPIC CHOLECYSTECTOMY  ~ 2001   ROUX-EN-Y GASTRIC BYPASS  11/20/2010   [place]   SPINAL CORD STIMULATOR INSERTION N/A 04/18/2017   Procedure: LUMBAR SPINAL CORD STIMULATOR INSERTION;  Surgeon: Clydell Hakim, MD;  Location: [place];  Service: Neurosurgery;  Laterality: N/A;  LUMBAR SPINAL CORD STIMULATOR INSERTION   stomach stent  04/28/2020   TENDON TRANSFER Right 08/29/2017   Procedure: right abductor pollicis longus transfer;  Surgeon: Daryll Brod, MD;  Location: [place];  Service: Orthopedics;  Laterality: Right;   TOTAL KNEE ARTHROPLASTY Left 03/23/2015   Procedure: TOTAL KNEE ARTHROPLASTY;  Surgeon: Newt Minion, MD;  Location: [place];  Service: Orthopedics;  Laterality: Left;   TOTAL KNEE ARTHROPLASTY Right 08/15/2016   Procedure: RIGHT TOTAL KNEE ARTHROPLASTY, REMOVAL ACL SCREWS;  Surgeon: Newt Minion, MD;  Location: [place];  Service: Orthopedics;  Laterality: Right;   TOTAL KNEE ARTHROPLASTY WITH HARDWARE REMOVAL Right    VAGINAL HYSTERECTOMY     tah/bso   VARICOSE VEIN SURGERY Right X 2   WEIL OSTEOTOMY Right 02/12/2020   Procedure: DOUBLE L OSTEOTOMY;  Surgeon: Felipa Furnace, DPM;  Location:  [place];  Service: Podiatry;  Laterality: Right;     No outpatient medications have been marked as taking for the 07/04/20 encounter (Appointment) with Josue Hector, MD.     Allergies:   Lyrica [pregabalin], Belsomra [suvorexant], Morphine and related, Sulfamethoxazole-trimethoprim, and Tape   Social History   Tobacco Use   Smoking status: Former Smoker    Packs/day: 0.75    Years: 8.00    Pack years: 6.00    Types: Cigarettes    Quit date: 12/01/1990    Years since quitting: 29.5   Smokeless tobacco: Never Used   Tobacco comment: quit smoking in the 1990s  Vaping Use   Vaping Use: Never used  Substance Use Topics   Alcohol use: No    Comment: 03/23/2015 "stopped drinking in 2012 w/gastric bypass; drank socially before bypass"   Drug use: No     Family Hx: The patient's family history includes Breast cancer in her mother; Cancer in her brother, father, maternal grandfather, and mother; Diabetes in her brother; Heart attack in her sister; Heart disease in her brother, father, and sister; Hypertension in an other family member; Hypothyroidism in her daughter; Myasthenia gravis in her mother; Parkinson's disease in her father; Stroke in her brother. There is no history of Colon cancer.  ROS:   Please see the history of present illness.     All other systems reviewed and are negative.   Prior CV studies:   The following studies were reviewed today:  TTE 01/18/20  Notes from Dr Raliegh Ip  Labs/Other Tests and Data Reviewed:    EKG:   06/28/2020 NSR rate 66 normal   Recent Labs: 03/02/2020: ALT 83   Recent Lipid Panel Lab Results  Component Value Date/Time   CHOL 151 05/02/2017 04:19 AM   TRIG 54 05/02/2017 04:19 AM   HDL 74 05/02/2017 04:19 AM   CHOLHDL 2.0 05/02/2017 04:19 AM   LDLCALC 66 05/02/2017 04:19 AM    Wt Readings from Last 3 Encounters:  06/07/20 91.8 kg  05/16/20 92.5 kg  03/02/20 93 kg     Objective:    Vital Signs:  There were no  vitals taken for this visit.   Affect appropriate Healthy:  appears stated age 89: normal Neck supple with no adenopathy JVP normal no bruits no thyromegaly Lungs clear with no wheezing and good diaphragmatic motion Heart:  S1/S2 no murmur, no rub, gallop or click PMI normal Abdomen: benighn, BS positve, no tenderness, no AAA no bruit.  No HSM or HJR Distal pulses intact with no bruits No edema Neuro residual right sided weakness from CVA  Skin warm and dry No muscular weakness Bunion on right foot    ASSESSMENT & PLAN:    1.  PFO documented TTE 01/18/20  Has been 9 years since placed on anticoagulation with no recurrence no indication for closure referral    2.  Bilateral venous varicosities: documented reflux has some fibromyalgia and does not like wearing compression stockings stable   3. Thyroid:  On replacement labs with primary   4. Ortho:  Post right bunion surgery 01/2020 Dr Posey Pronto   5. Tachy/BP:  BP on low side with relatively high HR when first in exam room ECG is fine will order labs to assess thyroid, anemia and BUN/CR    Medication Adjustments/Labs and Tests Ordered: Current medicines are reviewed at length with the patient today.  Concerns regarding medicines are outlined above.   Tests Ordered: See labs above    Medication Changes: No orders of the defined types were placed in this encounter.   Disposition:  Follow up in 1 year(s)  Signed, Jenkins Rouge, MD  06/28/2020 12:05 PM    [place]

## 2020-06-29 ENCOUNTER — Encounter: Payer: PPO | Admitting: Podiatry

## 2020-07-04 ENCOUNTER — Ambulatory Visit: Payer: PPO | Admitting: Cardiovascular Disease

## 2020-07-04 ENCOUNTER — Other Ambulatory Visit: Payer: Self-pay

## 2020-07-04 ENCOUNTER — Encounter: Payer: Self-pay | Admitting: Cardiovascular Disease

## 2020-07-04 ENCOUNTER — Other Ambulatory Visit (HOSPITAL_COMMUNITY)
Admission: RE | Admit: 2020-07-04 | Discharge: 2020-07-04 | Disposition: A | Discharge status: Home / Self Care | Payer: PPO | Source: Ambulatory Visit | Attending: Cardiovascular Disease | Admitting: Cardiovascular Disease

## 2020-07-04 VITALS — BP 100/72 | HR 107 | Ht 65.0 in | Wt 201.0 lb

## 2020-07-04 DIAGNOSIS — R000 Tachycardia, unspecified: Secondary | ICD-10-CM

## 2020-07-04 DIAGNOSIS — Q211 Atrial septal defect: Secondary | ICD-10-CM

## 2020-07-04 DIAGNOSIS — R0602 Shortness of breath: Secondary | ICD-10-CM | POA: Insufficient documentation

## 2020-07-04 DIAGNOSIS — Q2112 Patent foramen ovale: Secondary | ICD-10-CM

## 2020-07-04 LAB — BASIC METABOLIC PANEL
Anion gap: 5 (ref 5–15)
BUN: 13 mg/dL (ref 6–20)
CO2: 26 mmol/L (ref 22–32)
Calcium: 9.2 mg/dL (ref 8.9–10.3)
Chloride: 109 mmol/L (ref 98–111)
Creatinine, Ser: 0.96 mg/dL (ref 0.44–1.00)
GFR, Estimated: >60 mL/min (ref >60)
Glucose, Bld: 79 mg/dL (ref 70–99)
Potassium: 4.5 mmol/L (ref 3.5–5.1)
Sodium: 140 mmol/L (ref 135–145)

## 2020-07-04 LAB — CBC WITH DIFFERENTIAL/PLATELET
Abs Immature Granulocytes: 0.02 10*3/uL (ref 0.00–0.07)
Basophils Absolute: 0 10*3/uL (ref 0.0–0.1)
Basophils Relative: 1 %
Eosinophils Absolute: 0.2 10*3/uL (ref 0.0–0.5)
Eosinophils Relative: 2 %
HCT: 42.6 % (ref 36.0–46.0)
Hemoglobin: 13.7 g/dL (ref 12.0–15.0)
Immature Granulocytes: 0 %
Lymphocytes Relative: 22 %
Lymphs Abs: 1.9 10*3/uL (ref 0.7–4.0)
MCH: 32.7 pg (ref 26.0–34.0)
MCHC: 32.2 g/dL (ref 30.0–36.0)
MCV: 101.7 fL — ABNORMAL HIGH (ref 80.0–100.0)
Monocytes Absolute: 0.6 10*3/uL (ref 0.1–1.0)
Monocytes Relative: 7 %
Neutro Abs: 5.8 10*3/uL (ref 1.7–7.7)
Neutrophils Relative %: 68 %
Platelets: 202 10*3/uL (ref 150–400)
RBC: 4.19 MIL/uL (ref 3.87–5.11)
RDW: 13.4 % (ref 11.5–15.5)
WBC: 8.5 10*3/uL (ref 4.0–10.5)
nRBC: 0 % (ref 0.0–0.2)

## 2020-07-04 LAB — TSH: TSH: 0.398 u[IU]/mL (ref 0.350–4.500)

## 2020-07-04 LAB — T4, FREE: Free T4: 0.99 ng/dL (ref 0.61–1.12)

## 2020-07-04 NOTE — Patient Instructions (Signed)
Medication Instructions:  Your physician recommends that you continue on your current medications as directed. Please refer to the Current Medication list given to you today.  *If you need a refill on your cardiac medications before your next appointment, please call your pharmacy*   Lab Work: Your physician recommends that you return for lab work in: Today   If you have labs (blood work) drawn today and your tests are completely normal, you will receive your results only by: MyChart Message (if you have MyChart) OR A paper copy in the mail If you have any lab test that is abnormal or we need to change your treatment, we will call you to review the results.   Testing/Procedures:  NONE    Follow-Up: At [facility], you and your health needs are our priority.  As part of our continuing mission to provide you with exceptional heart care, we have created designated Provider Care Teams.  These Care Teams include your primary Cardiologist (physician) and Advanced Practice Providers (APPs -  Physician Assistants and Nurse Practitioners) who all work together to provide you with the care you need, when you need it.  We recommend signing up for the patient portal called "MyChart".  Sign up information is provided on this After Visit Summary.  MyChart is used to connect with patients for Virtual Visits (Telemedicine).  Patients are able to view lab/test results, encounter notes, upcoming appointments, etc.  Non-urgent messages can be sent to your provider as well.   To learn more about what you can do with MyChart, go to [email or web address].    Your next appointment:   6 month(s)  The format for your next appointment:   In Person  Provider:   Jenkins Rouge, MD   Other Instructions Thank you for choosing [facility]!

## 2020-07-05 ENCOUNTER — Telehealth (INDEPENDENT_AMBULATORY_CARE_PROVIDER_SITE_OTHER): Payer: Self-pay

## 2020-07-05 DIAGNOSIS — R7989 Other specified abnormal findings of blood chemistry: Secondary | ICD-10-CM

## 2020-07-05 NOTE — Telephone Encounter (Signed)
Patient states we were supposed to call her back regarding an appointment in May after her ERCP. She did not hear from [place] so she called to see when she is to be seen. Per Dr. Laural Golden have the patient get Lft drawn if normal we will see her in a couple of months if abnormal we will see her sooner. Patient is aware of all and aware I have printed her lab order to take to [place] she will pick up from our front office staff.

## 2020-07-06 ENCOUNTER — Ambulatory Visit (INDEPENDENT_AMBULATORY_CARE_PROVIDER_SITE_OTHER): Payer: PPO | Admitting: Podiatry

## 2020-07-06 ENCOUNTER — Other Ambulatory Visit: Payer: Self-pay

## 2020-07-06 DIAGNOSIS — T85848A Pain due to other internal prosthetic devices, implants and grafts, initial encounter: Secondary | ICD-10-CM | POA: Diagnosis not present

## 2020-07-08 ENCOUNTER — Other Ambulatory Visit (HOSPITAL_COMMUNITY): Payer: Self-pay | Admitting: *Deleted

## 2020-07-08 ENCOUNTER — Encounter: Payer: Self-pay | Admitting: Podiatry

## 2020-07-08 DIAGNOSIS — M79671 Pain in right foot: Secondary | ICD-10-CM | POA: Diagnosis not present

## 2020-07-08 DIAGNOSIS — F1120 Opioid dependence, uncomplicated: Secondary | ICD-10-CM | POA: Diagnosis not present

## 2020-07-08 DIAGNOSIS — H2513 Age-related nuclear cataract, bilateral: Secondary | ICD-10-CM | POA: Diagnosis not present

## 2020-07-08 DIAGNOSIS — Z6833 Body mass index (BMI) 33.0-33.9, adult: Secondary | ICD-10-CM | POA: Diagnosis not present

## 2020-07-08 DIAGNOSIS — R3915 Urgency of urination: Secondary | ICD-10-CM | POA: Diagnosis not present

## 2020-07-08 DIAGNOSIS — H524 Presbyopia: Secondary | ICD-10-CM | POA: Diagnosis not present

## 2020-07-08 DIAGNOSIS — M797 Fibromyalgia: Secondary | ICD-10-CM | POA: Diagnosis not present

## 2020-07-08 DIAGNOSIS — M1711 Unilateral primary osteoarthritis, right knee: Secondary | ICD-10-CM | POA: Diagnosis not present

## 2020-07-08 DIAGNOSIS — E119 Type 2 diabetes mellitus without complications: Secondary | ICD-10-CM | POA: Diagnosis not present

## 2020-07-08 DIAGNOSIS — E6609 Other obesity due to excess calories: Secondary | ICD-10-CM | POA: Diagnosis not present

## 2020-07-08 NOTE — Progress Notes (Signed)
Subjective:  Patient ID: Debbie Pineda, female    DOB: February 28, 1960,  MRN: [id number]  Chief Complaint  Patient presents with   Routine Post Op    Pain in the right foot     60 y.o. female presents with the above complaint.  Patient presents with pain to the right dorsal foot.  Patient states I think the hardware may be bothering her.  She states that she is doing well from the surgical site.  She denies any other acute complaints.  She would like to have the option of having it removed because it is causing her a lot of pain.  She denies any other acute complaints.    Review of Systems: Negative except as noted in the HPI. Denies N/V/F/Ch.  Past Medical History:  Diagnosis Date   ADD (attention deficit disorder)    Arthritis    "all over"   Cerebral infarction ([place]) 10/30/2011   Cerebrovascular disease 08/14/2016   Chronic back pain    "all over"   Chronic low back pain 83/09/4074   Complication of anesthesia    tends to have hypotension when NPO and post-anesthesia   Constipation    takes stool softener daily   Degenerative disk disease    Degenerative joint disease    DVT (deep venous thrombosis) ([place]) 2014   RLE   Family history of adverse reaction to anesthesia    a family member woke up during surgery; "think it was my mom"   Fibromyalgia    Generalized osteoarthritis of multiple sites 11/04/2013   GERD (gastroesophageal reflux disease)    Heart palpitations 12/14/2013   resolved   History of blood clots    superficial   Hypoglycemia    Hypothyroid    takes Synthroid daily   Incomplete emptying of bladder    Insomnia    takes Trazodone nightly   Iron deficiency anemia    takes Ferrous Sulfate daily   Joint pain    Joint swelling    knees and ankles   Memory disorder 08/14/2016   Morbid obesity (HCC)    Nausea    takes Zofran as needed.Seeing GI doc   Neck pain 05/25/2016   OSA on CPAP    tested more than 5 yrs ago.     Osteoarthritis    Osteopenia    in  feet   PFO (patent foramen ovale)    no murmur   Pre-diabetes    Primary osteoarthritis of both feet 05/29/2016   Right bunionectomy August 2017 by Dr. Sharol Given   Scoliosis    Skin abnormality 02/04/2020   raised area on lip   Sleep apnea    mild osa per pt   Stroke [facility]) "several"last 2014   right foot weakness; memory issues, black spot right visual field since" (03/23/2015)   Thrombophlebitis    Trochanteric bursitis of both hips 05/25/2016   Unilateral primary osteoarthritis, right knee 05/29/2016   Urinary urgency 04/19/2016   Vein disorder 11/29/2010   varicose veins both legs   Wears glasses     Current Outpatient Medications:    amoxicillin (AMOXIL) 500 MG capsule, Take 2,000 mg by mouth See admin instructions. Take 2000 mg by mouth 1 hour prior to dental appointment, Disp: , Rfl:    baclofen (LIORESAL) 10 MG tablet, Take 10 mg by mouth 3 (three) times daily. , Disp: , Rfl: 1   bisacodyl (DULCOLAX) 5 MG EC tablet, Take 5 mg by mouth daily as needed for moderate constipation.,  Disp: , Rfl:    Cyanocobalamin (VITAMIN B-12) 5000 MCG SUBL, Place 5,000 mcg under the tongue daily. , Disp: , Rfl:    diclofenac sodium (VOLTAREN) 1 % GEL, Apply 4 g topically 4 (four) times daily as needed (PAIN)., Disp: 5 Tube, Rfl: 1   docusate sodium (COLACE) 100 MG capsule, Take 100 mg by mouth as needed for mild constipation., Disp: , Rfl:    donepezil (ARICEPT) 10 MG tablet, Take 1 tablet by mouth at bedtime., Disp: 90 tablet, Rfl: 3   DULoxetine (CYMBALTA) 60 MG capsule, Take 60 mg by mouth 2 (two) times daily., Disp: , Rfl: 11   Erenumab-aooe (AIMOVIG) 70 MG/ML SOAJ, Inject 70 mg into the skin every 14 (fourteen) days., Disp: 2.24 mL, Rfl: 11   famotidine (PEPCID) 40 MG tablet, Take 0.5 tablets (20 mg total) by mouth daily., Disp: 90 tablet, Rfl: 3   folic acid (FOLVITE) 629 MCG tablet, Take 400 mcg by mouth daily., Disp: , Rfl:    gabapentin (NEURONTIN) 100 MG capsule, Take 100 mg by mouth 3 (three)  times daily., Disp: , Rfl:    gabapentin (NEURONTIN) 800 MG tablet, Take 800 mg by mouth 3 (three) times daily., Disp: , Rfl:    IBUPROFEN PO, Take 800 mg by mouth as needed., Disp: , Rfl:    Krill Oil 350 MG CAPS, Take 350 mg by mouth at bedtime. , Disp: , Rfl:    levothyroxine (SYNTHROID) 150 MCG tablet, Take 175 mcg by mouth daily before breakfast., Disp: , Rfl:    Methylfol-Methylcob-Acetylcyst (METAFOLBIC PLUS) 6-2-600 MG TABS, Take 1 tablet by mouth daily., Disp: 30 tablet, Rfl: 11   methylphenidate (CONCERTA) 27 MG PO CR tablet, Take 1 tablet (27 mg total) by mouth 2 (two) times daily., Disp: 60 tablet, Rfl: 0   mirabegron ER (MYRBETRIQ) 50 MG TB24 tablet, Take 50 mg by mouth daily., Disp: , Rfl:    Multiple Minerals-Vitamins (CAL-MAG-ZINC-D PO), Take 3 tablets by mouth daily. , Disp: , Rfl:    Multiple Vitamins-Minerals (ONE-A-DAY WOMENS PETITES PO), Take 1 tablet by mouth 2 (two) times daily. , Disp: , Rfl:    NARCAN 4 MG/0.1ML LIQD nasal spray kit, Place 0.4 mg into the nose once., Disp: , Rfl:    omeprazole (PRILOSEC) 40 MG capsule, Take 1 capsule (40 mg total) by mouth daily before breakfast. (Patient taking differently: Take 40 mg by mouth daily.), Disp: 90 capsule, Rfl: 3   ondansetron (ZOFRAN-ODT) 4 MG disintegrating tablet, Take 4 mg by mouth every 8 (eight) hours as needed for nausea or vomiting., Disp: , Rfl:    Pseudoephedrine HCl (SUDAFED PO), Take by mouth as needed., Disp: , Rfl:    Rimegepant Sulfate (NURTEC) 75 MG TBDP, Take 75 mg by mouth daily as needed., Disp: 8 tablet, Rfl: 5   rivaroxaban (XARELTO) 20 MG TABS tablet, Take 1 tablet (20 mg total) by mouth at bedtime., Disp: , Rfl:    Simethicone (PHAZYME ULTRA STRENGTH) 180 MG CAPS, Take 1 capsule (180 mg total) by mouth 3 (three) times daily as needed., Disp: , Rfl: 0   tapentadol HCl (NUCYNTA) 75 MG tablet, Take 75 mg by mouth 3 (three) times daily., Disp: , Rfl:    tiZANidine (ZANAFLEX) 4 MG tablet, Take 1 tablet (4 mg  total) by mouth every 6 (six) hours as needed for muscle spasms., Disp: 30 tablet, Rfl: 0   topiramate (TOPAMAX) 100 MG tablet, Take 1 tablet (100 mg total) by mouth 2 (two) times  daily., Disp: 180 tablet, Rfl: 3   traZODone (DESYREL) 100 MG tablet, Take 200 mg by mouth at bedtime., Disp: , Rfl:    trospium (SANCTURA) 20 MG tablet, Take 20 mg by mouth 2 (two) times daily., Disp: , Rfl:    Turmeric (QC TUMERIC COMPLEX PO), Take by mouth 2 (two) times daily., Disp: , Rfl:    vitamin C (ASCORBIC ACID) 500 MG tablet, Take 500 mg by mouth daily., Disp: , Rfl:    VITAMIN D PO, Take 5,000 Units by mouth daily. , Disp: , Rfl:   Social History   Tobacco Use  Smoking Status Former   Packs/day: 0.75   Years: 8.00   Pack years: 6.00   Types: Cigarettes   Quit date: 12/01/1990   Years since quitting: 29.6  Smokeless Tobacco Never  Tobacco Comments   quit smoking in the 1990s    Allergies  Allergen Reactions   Lyrica [Pregabalin] Shortness Of Breath and Swelling    lower extremity edema and weight gain   Belsomra [Suvorexant] Other (See Comments)    unknown   Morphine And Related Itching    Upper torso   Sulfamethoxazole-Trimethoprim Itching and Rash    Bactrim   Tape Itching and Rash    Please use "paper" tape Rash if left on longer than 24 hrs   Objective:  There were no vitals filed for this visit. There is no height or weight on file to calculate BMI. Constitutional Well developed. Well nourished.  Vascular Dorsalis pedis pulses palpable bilaterally. Posterior tibial pulses palpable bilaterally. Capillary refill normal to all digits.  No cyanosis or clubbing noted. Pedal hair growth normal.  Neurologic Normal speech. Oriented to person, place, and time. Epicritic sensation to light touch grossly present bilaterally.  Dermatologic Nails well groomed and normal in appearance. No open wounds. No skin lesions.  Orthopedic: Pain on palpation to the right first tarsometatarsal  joint at the site of previous hardware.  Pain on palpation to the hardware.   Radiographs: None Assessment:   1. Pain from implanted hardware, initial encounter    Plan:  Patient was evaluated and treated and all questions answered.  Right painful orthopedic hardware status post Lapidus bunion fusion -I explained the patient the etiology of painful orthopedic hardware and various treatment options were discussed.  Given that this is now almost 6 months out from surgical site I believe patient will benefit from removal of hardware.  However prior to proceeding with that I believe patient will benefit from a CT scan to evaluate for consolidation of the first tarsometatarsal joint.  Patient agrees with the plan would like to proceed with a CT scan.  If the fusion has taken place we will plan on taking the hardware out.  If not we may need to revise the fusion. -CT scan was ordered  No follow-ups on file.

## 2020-07-12 ENCOUNTER — Inpatient Hospital Stay (HOSPITAL_COMMUNITY): Admission: RE | Admit: 2020-07-12 | Payer: PPO | Source: Ambulatory Visit

## 2020-07-12 ENCOUNTER — Other Ambulatory Visit: Payer: Self-pay

## 2020-07-12 ENCOUNTER — Encounter (HOSPITAL_COMMUNITY): Payer: PPO

## 2020-07-21 DIAGNOSIS — M797 Fibromyalgia: Secondary | ICD-10-CM | POA: Diagnosis not present

## 2020-07-21 DIAGNOSIS — E063 Autoimmune thyroiditis: Secondary | ICD-10-CM | POA: Diagnosis not present

## 2020-07-21 DIAGNOSIS — E7849 Other hyperlipidemia: Secondary | ICD-10-CM | POA: Diagnosis not present

## 2020-07-27 ENCOUNTER — Encounter: Payer: PPO | Admitting: Podiatry

## 2020-07-29 ENCOUNTER — Ambulatory Visit
Admission: RE | Admit: 2020-07-29 | Discharge: 2020-07-29 | Disposition: A | Discharge status: Home / Self Care | Payer: PPO | Source: Ambulatory Visit | Attending: Podiatry | Admitting: Podiatry

## 2020-07-29 DIAGNOSIS — M19071 Primary osteoarthritis, right ankle and foot: Secondary | ICD-10-CM | POA: Diagnosis not present

## 2020-07-29 DIAGNOSIS — M7731 Calcaneal spur, right foot: Secondary | ICD-10-CM | POA: Diagnosis not present

## 2020-07-29 DIAGNOSIS — Z981 Arthrodesis status: Secondary | ICD-10-CM | POA: Diagnosis not present

## 2020-07-29 DIAGNOSIS — T85848A Pain due to other internal prosthetic devices, implants and grafts, initial encounter: Secondary | ICD-10-CM

## 2020-08-03 ENCOUNTER — Other Ambulatory Visit: Payer: Self-pay

## 2020-08-03 ENCOUNTER — Ambulatory Visit: Payer: PPO | Admitting: Podiatry

## 2020-08-03 DIAGNOSIS — Z01818 Encounter for other preprocedural examination: Secondary | ICD-10-CM | POA: Diagnosis not present

## 2020-08-03 DIAGNOSIS — M2041 Other hammer toe(s) (acquired), right foot: Secondary | ICD-10-CM

## 2020-08-03 DIAGNOSIS — T85848A Pain due to other internal prosthetic devices, implants and grafts, initial encounter: Secondary | ICD-10-CM | POA: Diagnosis not present

## 2020-08-05 DIAGNOSIS — R300 Dysuria: Secondary | ICD-10-CM | POA: Diagnosis not present

## 2020-08-05 DIAGNOSIS — F909 Attention-deficit hyperactivity disorder, unspecified type: Secondary | ICD-10-CM | POA: Diagnosis not present

## 2020-08-05 DIAGNOSIS — E7849 Other hyperlipidemia: Secondary | ICD-10-CM | POA: Diagnosis not present

## 2020-08-05 DIAGNOSIS — E063 Autoimmune thyroiditis: Secondary | ICD-10-CM | POA: Diagnosis not present

## 2020-08-05 DIAGNOSIS — Z6832 Body mass index (BMI) 32.0-32.9, adult: Secondary | ICD-10-CM | POA: Diagnosis not present

## 2020-08-05 DIAGNOSIS — E119 Type 2 diabetes mellitus without complications: Secondary | ICD-10-CM | POA: Diagnosis not present

## 2020-08-05 DIAGNOSIS — M797 Fibromyalgia: Secondary | ICD-10-CM | POA: Diagnosis not present

## 2020-08-05 DIAGNOSIS — E6609 Other obesity due to excess calories: Secondary | ICD-10-CM | POA: Diagnosis not present

## 2020-08-09 ENCOUNTER — Encounter: Payer: Self-pay | Admitting: Podiatry

## 2020-08-09 DIAGNOSIS — Z6832 Body mass index (BMI) 32.0-32.9, adult: Secondary | ICD-10-CM | POA: Diagnosis not present

## 2020-08-09 DIAGNOSIS — E6609 Other obesity due to excess calories: Secondary | ICD-10-CM | POA: Diagnosis not present

## 2020-08-09 DIAGNOSIS — E7849 Other hyperlipidemia: Secondary | ICD-10-CM | POA: Diagnosis not present

## 2020-08-09 DIAGNOSIS — E039 Hypothyroidism, unspecified: Secondary | ICD-10-CM | POA: Diagnosis not present

## 2020-08-09 NOTE — Progress Notes (Signed)
Subjective:  Patient ID: Debbie Pineda, female    DOB: 08/28/1960,  MRN: [id number]  Chief Complaint  Patient presents with   Foot Pain    Right foot pain PT stated that she still has some pain     60 y.o. female presents with the above complaint.  Patient presents with pain to the right dorsal foot.  Patient states I think the hardware may be bothering her.  She states that she is doing well from the surgical site.  She denies any other acute complaints.  She would like to have the option of having it removed because it is causing her a lot of pain.  She denies any other acute complaints.    Review of Systems: Negative except as noted in the HPI. Denies N/V/F/Ch.  Past Medical History:  Diagnosis Date   ADD (attention deficit disorder)    Arthritis    "all over"   Cerebral infarction ([place]) 10/30/2011   Cerebrovascular disease 08/14/2016   Chronic back pain    "all over"   Chronic low back pain 09/40/7680   Complication of anesthesia    tends to have hypotension when NPO and post-anesthesia   Constipation    takes stool softener daily   Degenerative disk disease    Degenerative joint disease    DVT (deep venous thrombosis) ([place]) 2014   RLE   Family history of adverse reaction to anesthesia    a family member woke up during surgery; "think it was my mom"   Fibromyalgia    Generalized osteoarthritis of multiple sites 11/04/2013   GERD (gastroesophageal reflux disease)    Heart palpitations 12/14/2013   resolved   History of blood clots    superficial   Hypoglycemia    Hypothyroid    takes Synthroid daily   Incomplete emptying of bladder    Insomnia    takes Trazodone nightly   Iron deficiency anemia    takes Ferrous Sulfate daily   Joint pain    Joint swelling    knees and ankles   Memory disorder 08/14/2016   Morbid obesity (HCC)    Nausea    takes Zofran as needed.Seeing GI doc   Neck pain 05/25/2016   OSA on CPAP    tested more than 5 yrs ago.      Osteoarthritis    Osteopenia    in feet   PFO (patent foramen ovale)    no murmur   Pre-diabetes    Primary osteoarthritis of both feet 05/29/2016   Right bunionectomy August 2017 by Dr. Sharol Given   Scoliosis    Skin abnormality 02/04/2020   raised area on lip   Sleep apnea    mild osa per pt   Stroke [facility]) "several"last 2014   right foot weakness; memory issues, black spot right visual field since" (03/23/2015)   Thrombophlebitis    Trochanteric bursitis of both hips 05/25/2016   Unilateral primary osteoarthritis, right knee 05/29/2016   Urinary urgency 04/19/2016   Vein disorder 11/29/2010   varicose veins both legs   Wears glasses     Current Outpatient Medications:    amoxicillin (AMOXIL) 500 MG capsule, Take 2,000 mg by mouth See admin instructions. Take 2000 mg by mouth 1 hour prior to dental appointment, Disp: , Rfl:    baclofen (LIORESAL) 10 MG tablet, Take 10 mg by mouth 3 (three) times daily. , Disp: , Rfl: 1   bisacodyl (DULCOLAX) 5 MG EC tablet, Take 5 mg by mouth daily  as needed for moderate constipation., Disp: , Rfl:    Cyanocobalamin (VITAMIN B-12) 5000 MCG SUBL, Place 5,000 mcg under the tongue daily. , Disp: , Rfl:    diclofenac sodium (VOLTAREN) 1 % GEL, Apply 4 g topically 4 (four) times daily as needed (PAIN)., Disp: 5 Tube, Rfl: 1   docusate sodium (COLACE) 100 MG capsule, Take 100 mg by mouth as needed for mild constipation., Disp: , Rfl:    donepezil (ARICEPT) 10 MG tablet, Take 1 tablet by mouth at bedtime., Disp: 90 tablet, Rfl: 3   DULoxetine (CYMBALTA) 60 MG capsule, Take 60 mg by mouth 2 (two) times daily., Disp: , Rfl: 11   Erenumab-aooe (AIMOVIG) 70 MG/ML SOAJ, Inject 70 mg into the skin every 14 (fourteen) days., Disp: 2.24 mL, Rfl: 11   famotidine (PEPCID) 40 MG tablet, Take 0.5 tablets (20 mg total) by mouth daily., Disp: 90 tablet, Rfl: 3   folic acid (FOLVITE) 979 MCG tablet, Take 400 mcg by mouth daily., Disp: , Rfl:    gabapentin (NEURONTIN) 100 MG  capsule, Take 100 mg by mouth 3 (three) times daily., Disp: , Rfl:    gabapentin (NEURONTIN) 800 MG tablet, Take 800 mg by mouth 3 (three) times daily., Disp: , Rfl:    IBUPROFEN PO, Take 800 mg by mouth as needed., Disp: , Rfl:    Krill Oil 350 MG CAPS, Take 350 mg by mouth at bedtime. , Disp: , Rfl:    levothyroxine (SYNTHROID) 150 MCG tablet, Take 175 mcg by mouth daily before breakfast., Disp: , Rfl:    Methylfol-Methylcob-Acetylcyst (METAFOLBIC PLUS) 6-2-600 MG TABS, Take 1 tablet by mouth daily., Disp: 30 tablet, Rfl: 11   methylphenidate (CONCERTA) 27 MG PO CR tablet, Take 1 tablet (27 mg total) by mouth 2 (two) times daily., Disp: 60 tablet, Rfl: 0   mirabegron ER (MYRBETRIQ) 50 MG TB24 tablet, Take 50 mg by mouth daily., Disp: , Rfl:    Multiple Minerals-Vitamins (CAL-MAG-ZINC-D PO), Take 3 tablets by mouth daily. , Disp: , Rfl:    Multiple Vitamins-Minerals (ONE-A-DAY WOMENS PETITES PO), Take 1 tablet by mouth 2 (two) times daily. , Disp: , Rfl:    NARCAN 4 MG/0.1ML LIQD nasal spray kit, Place 0.4 mg into the nose once., Disp: , Rfl:    omeprazole (PRILOSEC) 40 MG capsule, Take 1 capsule (40 mg total) by mouth daily before breakfast. (Patient taking differently: Take 40 mg by mouth daily.), Disp: 90 capsule, Rfl: 3   ondansetron (ZOFRAN-ODT) 4 MG disintegrating tablet, Take 4 mg by mouth every 8 (eight) hours as needed for nausea or vomiting., Disp: , Rfl:    Pseudoephedrine HCl (SUDAFED PO), Take by mouth as needed., Disp: , Rfl:    Rimegepant Sulfate (NURTEC) 75 MG TBDP, Take 75 mg by mouth daily as needed., Disp: 8 tablet, Rfl: 5   rivaroxaban (XARELTO) 20 MG TABS tablet, Take 1 tablet (20 mg total) by mouth at bedtime., Disp: , Rfl:    Simethicone (PHAZYME ULTRA STRENGTH) 180 MG CAPS, Take 1 capsule (180 mg total) by mouth 3 (three) times daily as needed., Disp: , Rfl: 0   tapentadol HCl (NUCYNTA) 75 MG tablet, Take 75 mg by mouth 3 (three) times daily., Disp: , Rfl:    tiZANidine  (ZANAFLEX) 4 MG tablet, Take 1 tablet (4 mg total) by mouth every 6 (six) hours as needed for muscle spasms., Disp: 30 tablet, Rfl: 0   topiramate (TOPAMAX) 100 MG tablet, Take 1 tablet (100 mg total)  by mouth 2 (two) times daily., Disp: 180 tablet, Rfl: 3   traZODone (DESYREL) 100 MG tablet, Take 200 mg by mouth at bedtime., Disp: , Rfl:    trospium (SANCTURA) 20 MG tablet, Take 20 mg by mouth 2 (two) times daily., Disp: , Rfl:    Turmeric (QC TUMERIC COMPLEX PO), Take by mouth 2 (two) times daily., Disp: , Rfl:    vitamin C (ASCORBIC ACID) 500 MG tablet, Take 500 mg by mouth daily., Disp: , Rfl:    VITAMIN D PO, Take 5,000 Units by mouth daily. , Disp: , Rfl:   Social History   Tobacco Use  Smoking Status Former   Packs/day: 0.75   Years: 8.00   Pack years: 6.00   Types: Cigarettes   Quit date: 12/01/1990   Years since quitting: 29.7  Smokeless Tobacco Never  Tobacco Comments   quit smoking in the 1990s    Allergies  Allergen Reactions   Lyrica [Pregabalin] Shortness Of Breath and Swelling    lower extremity edema and weight gain   Belsomra [Suvorexant] Other (See Comments)    unknown   Morphine And Related Itching    Upper torso   Sulfamethoxazole-Trimethoprim Itching and Rash    Bactrim   Tape Itching and Rash    Please use "paper" tape Rash if left on longer than 24 hrs   Objective:  There were no vitals filed for this visit. There is no height or weight on file to calculate BMI. Constitutional Well developed. Well nourished.  Vascular Dorsalis pedis pulses palpable bilaterally. Posterior tibial pulses palpable bilaterally. Capillary refill normal to all digits.  No cyanosis or clubbing noted. Pedal hair growth normal.  Neurologic Normal speech. Oriented to person, place, and time. Epicritic sensation to light touch grossly present bilaterally.  Dermatologic Nails well groomed and normal in appearance. No open wounds. No skin lesions.  Orthopedic: Pain on  palpation to the right first tarsometatarsal joint at the site of previous hardware.  Pain on palpation to the hardware.  Mild correction of hammertoe noted to the right second digit.  Slight distal angulation noted.  No other bony abnormalities identified.  Semiflexible in nature   Radiographs: None Assessment:   1. Hammertoe of second toe of right foot   2. Pain from implanted hardware, initial encounter   3. Preoperative examination     Plan:  Patient was evaluated and treated and all questions answered.  Right painful orthopedic hardware status post Lapidus bunion fusion -I explained the patient the etiology of painful orthopedic hardware and various treatment options were discussed.  Given that this is now almost 6 months out from surgical site I believe patient will benefit from removal of hardware.   -CT can scan was reviewed with the patient extensive detail.  Patient does have consolidation at the first tarsometatarsal joint.  At this time patient will benefit from surgical removal of painful orthopedic hardware.  I discussed the surgical plan as well as risk and complication associated with it.  Patient states understanding would like to proceed with that. Should be-weightbearing as tolerated in a surgical shoe and cam boot.  Right second digit malalignment hammertoe -I explained the patient the etiology of hammertoe correction and various treatment options were extensively discussed.  I discussed with her that given that this is leading to painful hammertoe because of slight raised elevation I believe patient will benefit from revisional hammertoe correction of the second digit.  I discussed with her that she will have  a pain at the tip of the toe to help maintain the correction.  Patient states understanding would like to proceed with that.  I discussed my surgical plan in extensive detail with the patient.  I discussed with her that there is a chance of recurrence and might not have  been good correction she would like.  She states understand like to proceed with a revisional hammertoe correction of the second digit   No follow-ups on file.

## 2020-08-11 ENCOUNTER — Other Ambulatory Visit (INDEPENDENT_AMBULATORY_CARE_PROVIDER_SITE_OTHER): Payer: Self-pay | Admitting: Internal Medicine

## 2020-08-21 DIAGNOSIS — E7849 Other hyperlipidemia: Secondary | ICD-10-CM | POA: Diagnosis not present

## 2020-08-21 DIAGNOSIS — M797 Fibromyalgia: Secondary | ICD-10-CM | POA: Diagnosis not present

## 2020-08-21 DIAGNOSIS — E063 Autoimmune thyroiditis: Secondary | ICD-10-CM | POA: Diagnosis not present

## 2020-08-26 ENCOUNTER — Telehealth: Payer: PPO | Admitting: Urology

## 2020-08-26 DIAGNOSIS — N3941 Urge incontinence: Secondary | ICD-10-CM | POA: Diagnosis not present

## 2020-08-26 DIAGNOSIS — N952 Postmenopausal atrophic vaginitis: Secondary | ICD-10-CM | POA: Diagnosis not present

## 2020-08-26 DIAGNOSIS — N9489 Other specified conditions associated with female genital organs and menstrual cycle: Secondary | ICD-10-CM | POA: Diagnosis not present

## 2020-08-26 DIAGNOSIS — N941 Unspecified dyspareunia: Secondary | ICD-10-CM | POA: Diagnosis not present

## 2020-08-26 NOTE — Telephone Encounter (Signed)
DOS - 09/12/20   REMOVAL FIXATION DEEP RIGHT --- 20680 HAMMERTOE REPAIR 2ND RIGHT --- [id number]  HTA EFFECTIVE DATE - 07/22/16  RECEIVED FAX FROM [facility] STATING THAT CPT CODES 95188 AND 41660 HAVE BEEN APPROVED, AUTH # [id number], GOOD FROM 09/12/20 - 12/11/20.

## 2020-09-06 ENCOUNTER — Telehealth: Payer: Self-pay | Admitting: *Deleted

## 2020-09-06 NOTE — Telephone Encounter (Signed)
Called office of Dr Jenkins Rouge (cardiologist) for surgery clearance to stop Xalrelto. Spoke with Abbe Amsterdam and he will send a message to Dr's Nishan's nurse to fax clearance to our office asap, attn: Dr Posey Pronto.

## 2020-09-06 NOTE — Telephone Encounter (Signed)
Patient is calling and would like to know when she should she stop her Xarelto before her upcoming surgery on Aug. 22nd. She has not heard anything from Cardiologist. Please advise.

## 2020-09-07 ENCOUNTER — Telehealth: Payer: Self-pay | Admitting: Cardiovascular Disease

## 2020-09-07 NOTE — Telephone Encounter (Signed)
      [facility] Pre-operative Risk Assessment    Patient Name: Debbie Pineda  DOB: 1960/11/22 MRN: [id number]  HEARTCARE STAFF:  - IMPORTANT!!!!!! Under Visit Info/Reason for Call, type in Other and utilize the format Clearance MM/DD/YY or Clearance TBD. Do not use dashes or single digits. - Please review there is not already an duplicate clearance open for this procedure. - If request is for dental extraction, please clarify the # of teeth to be extracted. - If the patient is currently at the dentist's office, call Pre-Op Callback Staff (MA/nurse) to input urgent request.  - If the patient is not currently in the dentist office, please route to the Pre-Op pool.  Request for surgical clearance:  What type of surgery is being performed? Removal hardware right foot, hammertoe repair 2nd toe right foot  When is this surgery scheduled? 09/12/20  What type of clearance is required (medical clearance vs. Pharmacy clearance to hold med vs. Both)? Both  Are there any medications that need to be held prior to surgery and how long? Xarelto  Practice name and name of physician performing surgery? Dr. Boneta Lucks  What is the office phone number? [phone]   7.   What is the office fax number? [phone]  8.   Anesthesia type (None, local, MAC, general) ? Choice   Debbie Pineda 09/07/2020, 8:49 AM  _________________________________________________________________   (provider comments below)

## 2020-09-07 NOTE — Telephone Encounter (Signed)
Left VM.   Takes xarelto for factor V Leiden and CVA.

## 2020-09-08 DIAGNOSIS — M1711 Unilateral primary osteoarthritis, right knee: Secondary | ICD-10-CM | POA: Diagnosis not present

## 2020-09-08 DIAGNOSIS — E6609 Other obesity due to excess calories: Secondary | ICD-10-CM | POA: Diagnosis not present

## 2020-09-08 DIAGNOSIS — F1120 Opioid dependence, uncomplicated: Secondary | ICD-10-CM | POA: Diagnosis not present

## 2020-09-08 DIAGNOSIS — M797 Fibromyalgia: Secondary | ICD-10-CM | POA: Diagnosis not present

## 2020-09-08 DIAGNOSIS — G4720 Circadian rhythm sleep disorder, unspecified type: Secondary | ICD-10-CM | POA: Diagnosis not present

## 2020-09-08 DIAGNOSIS — Z6833 Body mass index (BMI) 33.0-33.9, adult: Secondary | ICD-10-CM | POA: Diagnosis not present

## 2020-09-08 NOTE — Telephone Encounter (Signed)
Left a voice message for the surgery scheduler to give our office a call back to confirm if clearance was received.

## 2020-09-08 NOTE — Telephone Encounter (Signed)
   Name: Debbie Pineda  DOB: 08-22-1960  MRN: [id number]   Primary Cardiologist: Jenkins Rouge, MD  Chart reviewed as part of pre-operative protocol coverage. Patient was contacted 09/08/2020 in reference to pre-operative risk assessment for pending surgery as outlined below.  Debbie Pineda was last seen on 07/04/20 by Dr. Johnsie Cancel.  Since that day, Debbie Pineda has done well. She is primarily limited by back pain and fibromyalgia. She is able to complete 4.0 METS when her pain is controlled.   Per the chart, she takes xarelto for Factor V Leiden deficiency and CVA. Please reach out to PCP for xarelto hold.  Therefore, based on ACC/AHA guidelines, the patient would be at acceptable risk for the planned procedure without further cardiovascular testing.   The patient was advised that if she develops new symptoms prior to surgery to contact our office to arrange for a follow-up visit, and she verbalized understanding.  I will route this recommendation to the requesting party via Epic fax function and remove from pre-op pool. Please call with questions.  Tami Lin Clarance Bollard, PA 09/08/2020, 9:11 AM

## 2020-09-08 NOTE — Telephone Encounter (Signed)
Patient called wanting to know when to HOLD her Xarelto. She is scheduled for surgery at [facility] on Monday. She said she did not get a message from Fabian Sharp. Please check into and let the patient know.

## 2020-09-08 NOTE — Telephone Encounter (Signed)
LVM for requesting office to confirm clearance was received

## 2020-09-12 ENCOUNTER — Other Ambulatory Visit: Payer: Self-pay | Admitting: Podiatry

## 2020-09-12 ENCOUNTER — Encounter: Payer: Self-pay | Admitting: Podiatry

## 2020-09-12 DIAGNOSIS — M24574 Contracture, right foot: Secondary | ICD-10-CM | POA: Diagnosis not present

## 2020-09-12 DIAGNOSIS — Z4889 Encounter for other specified surgical aftercare: Secondary | ICD-10-CM | POA: Diagnosis not present

## 2020-09-12 DIAGNOSIS — M2041 Other hammer toe(s) (acquired), right foot: Secondary | ICD-10-CM | POA: Diagnosis not present

## 2020-09-12 DIAGNOSIS — T8484XA Pain due to internal orthopedic prosthetic devices, implants and grafts, initial encounter: Secondary | ICD-10-CM | POA: Diagnosis not present

## 2020-09-12 MED ORDER — OXYCODONE-ACETAMINOPHEN 5-325 MG PO TABS: 1.0000 | ORAL_TABLET | ORAL | 0 refills | Status: DC | PRN

## 2020-09-12 MED ORDER — OXYCODONE-ACETAMINOPHEN 10-325 MG PO TABS: 1.0000 | ORAL_TABLET | ORAL | 0 refills | Status: DC | PRN

## 2020-09-20 ENCOUNTER — Other Ambulatory Visit: Payer: Self-pay | Admitting: Podiatry

## 2020-09-20 MED ORDER — OXYCODONE-ACETAMINOPHEN 10-325 MG PO TABS: 1.0000 | ORAL_TABLET | ORAL | 0 refills | Status: DC | PRN

## 2020-09-20 NOTE — Progress Notes (Unsigned)
per

## 2020-09-21 ENCOUNTER — Ambulatory Visit (INDEPENDENT_AMBULATORY_CARE_PROVIDER_SITE_OTHER): Payer: PPO | Admitting: Podiatry

## 2020-09-21 ENCOUNTER — Other Ambulatory Visit: Payer: Self-pay

## 2020-09-21 ENCOUNTER — Ambulatory Visit (INDEPENDENT_AMBULATORY_CARE_PROVIDER_SITE_OTHER): Payer: PPO

## 2020-09-21 DIAGNOSIS — M2041 Other hammer toe(s) (acquired), right foot: Secondary | ICD-10-CM

## 2020-09-21 DIAGNOSIS — T85848A Pain due to other internal prosthetic devices, implants and grafts, initial encounter: Secondary | ICD-10-CM | POA: Diagnosis not present

## 2020-09-21 DIAGNOSIS — Z9889 Other specified postprocedural states: Secondary | ICD-10-CM

## 2020-09-22 NOTE — Progress Notes (Signed)
Subjective:  Patient ID: Debbie Pineda, female    DOB: 1960-05-19,  MRN: [id number]  Chief Complaint  Patient presents with   Routine Post Op    POV DOS 8.22.22    DOS: 09/12/2020 Procedure: Right revisional second digit hammertoe with removal of previous painful orthopedic hardware  60 y.o. female returns for post-op check.  Patient states she is doing well.  Pain is being managed by pain medication.  She is weightbearing to the right lower extremity with a cam boot.  She denies any other acute complaints the pins are intact bandages clean dry and intact  Review of Systems: Negative except as noted in the HPI. Denies N/V/F/Ch.  Past Medical History:  Diagnosis Date   ADD (attention deficit disorder)    Arthritis    "all over"   Cerebral infarction ([place]) 10/30/2011   Cerebrovascular disease 08/14/2016   Chronic back pain    "all over"   Chronic low back pain 62/22/9798   Complication of anesthesia    tends to have hypotension when NPO and post-anesthesia   Constipation    takes stool softener daily   Degenerative disk disease    Degenerative joint disease    DVT (deep venous thrombosis) ([place]) 2014   RLE   Family history of adverse reaction to anesthesia    a family member woke up during surgery; "think it was my mom"   Fibromyalgia    Generalized osteoarthritis of multiple sites 11/04/2013   GERD (gastroesophageal reflux disease)    Heart palpitations 12/14/2013   resolved   History of blood clots    superficial   Hypoglycemia    Hypothyroid    takes Synthroid daily   Incomplete emptying of bladder    Insomnia    takes Trazodone nightly   Iron deficiency anemia    takes Ferrous Sulfate daily   Joint pain    Joint swelling    knees and ankles   Memory disorder 08/14/2016   Morbid obesity (HCC)    Nausea    takes Zofran as needed.Seeing GI doc   Neck pain 05/25/2016   OSA on CPAP    tested more than 5 yrs ago.     Osteoarthritis    Osteopenia    in feet    PFO (patent foramen ovale)    no murmur   Pre-diabetes    Primary osteoarthritis of both feet 05/29/2016   Right bunionectomy August 2017 by Dr. Sharol Given   Scoliosis    Skin abnormality 02/04/2020   raised area on lip   Sleep apnea    mild osa per pt   Stroke [facility]) "several"last 2014   right foot weakness; memory issues, black spot right visual field since" (03/23/2015)   Thrombophlebitis    Trochanteric bursitis of both hips 05/25/2016   Unilateral primary osteoarthritis, right knee 05/29/2016   Urinary urgency 04/19/2016   Vein disorder 11/29/2010   varicose veins both legs   Wears glasses     Current Outpatient Medications:    amoxicillin (AMOXIL) 500 MG capsule, Take 2,000 mg by mouth See admin instructions. Take 2000 mg by mouth 1 hour prior to dental appointment, Disp: , Rfl:    baclofen (LIORESAL) 10 MG tablet, Take 10 mg by mouth 3 (three) times daily. , Disp: , Rfl: 1   bisacodyl (DULCOLAX) 5 MG EC tablet, Take 5 mg by mouth daily as needed for moderate constipation., Disp: , Rfl:    Cyanocobalamin (VITAMIN B-12) 5000 MCG SUBL, Place 5,000  mcg under the tongue daily. , Disp: , Rfl:    diclofenac sodium (VOLTAREN) 1 % GEL, Apply 4 g topically 4 (four) times daily as needed (PAIN)., Disp: 5 Tube, Rfl: 1   docusate sodium (COLACE) 100 MG capsule, Take 100 mg by mouth as needed for mild constipation., Disp: , Rfl:    donepezil (ARICEPT) 10 MG tablet, Take 1 tablet by mouth at bedtime., Disp: 90 tablet, Rfl: 3   DULoxetine (CYMBALTA) 60 MG capsule, Take 60 mg by mouth 2 (two) times daily., Disp: , Rfl: 11   Erenumab-aooe (AIMOVIG) 70 MG/ML SOAJ, Inject 70 mg into the skin every 14 (fourteen) days., Disp: 2.24 mL, Rfl: 11   famotidine (PEPCID) 40 MG tablet, Take 0.5 tablets (20 mg total) by mouth daily., Disp: 90 tablet, Rfl: 3   folic acid (FOLVITE) 103 MCG tablet, Take 400 mcg by mouth daily., Disp: , Rfl:    gabapentin (NEURONTIN) 100 MG capsule, Take 100 mg by mouth 3 (three) times  daily., Disp: , Rfl:    gabapentin (NEURONTIN) 800 MG tablet, Take 800 mg by mouth 3 (three) times daily., Disp: , Rfl:    IBUPROFEN PO, Take 800 mg by mouth as needed., Disp: , Rfl:    Krill Oil 350 MG CAPS, Take 350 mg by mouth at bedtime. , Disp: , Rfl:    levothyroxine (SYNTHROID) 150 MCG tablet, Take 175 mcg by mouth daily before breakfast., Disp: , Rfl:    Methylfol-Methylcob-Acetylcyst (METAFOLBIC PLUS) 6-2-600 MG TABS, Take 1 tablet by mouth daily., Disp: 30 tablet, Rfl: 11   methylphenidate (CONCERTA) 27 MG PO CR tablet, Take 1 tablet (27 mg total) by mouth 2 (two) times daily., Disp: 60 tablet, Rfl: 0   mirabegron ER (MYRBETRIQ) 50 MG TB24 tablet, Take 50 mg by mouth daily., Disp: , Rfl:    Multiple Minerals-Vitamins (CAL-MAG-ZINC-D PO), Take 3 tablets by mouth daily. , Disp: , Rfl:    Multiple Vitamins-Minerals (ONE-A-DAY WOMENS PETITES PO), Take 1 tablet by mouth 2 (two) times daily. , Disp: , Rfl:    NARCAN 4 MG/0.1ML LIQD nasal spray kit, Place 0.4 mg into the nose once., Disp: , Rfl:    omeprazole (PRILOSEC) 40 MG capsule, Take 1 capsule by mouth daily before breakfast., Disp: 90 capsule, Rfl: 1   ondansetron (ZOFRAN-ODT) 4 MG disintegrating tablet, Take 4 mg by mouth every 8 (eight) hours as needed for nausea or vomiting., Disp: , Rfl:    oxyCODONE-acetaminophen (PERCOCET) 10-325 MG tablet, Take 1 tablet by mouth every 4 (four) hours as needed for pain., Disp: 30 tablet, Rfl: 0   oxyCODONE-acetaminophen (PERCOCET) 5-325 MG tablet, Take 1-2 tablets by mouth every 4 (four) hours as needed for severe pain., Disp: 30 tablet, Rfl: 0   Pseudoephedrine HCl (SUDAFED PO), Take by mouth as needed., Disp: , Rfl:    Rimegepant Sulfate (NURTEC) 75 MG TBDP, Take 75 mg by mouth daily as needed., Disp: 8 tablet, Rfl: 5   rivaroxaban (XARELTO) 20 MG TABS tablet, Take 1 tablet (20 mg total) by mouth at bedtime., Disp: , Rfl:    Simethicone (PHAZYME ULTRA STRENGTH) 180 MG CAPS, Take 1 capsule (180 mg  total) by mouth 3 (three) times daily as needed., Disp: , Rfl: 0   tapentadol HCl (NUCYNTA) 75 MG tablet, Take 75 mg by mouth 3 (three) times daily., Disp: , Rfl:    tiZANidine (ZANAFLEX) 4 MG tablet, Take 1 tablet (4 mg total) by mouth every 6 (six) hours as needed for  muscle spasms., Disp: 30 tablet, Rfl: 0   topiramate (TOPAMAX) 100 MG tablet, Take 1 tablet (100 mg total) by mouth 2 (two) times daily., Disp: 180 tablet, Rfl: 3   traZODone (DESYREL) 100 MG tablet, Take 200 mg by mouth at bedtime., Disp: , Rfl:    trospium (SANCTURA) 20 MG tablet, Take 20 mg by mouth 2 (two) times daily., Disp: , Rfl:    Turmeric (QC TUMERIC COMPLEX PO), Take by mouth 2 (two) times daily., Disp: , Rfl:    vitamin C (ASCORBIC ACID) 500 MG tablet, Take 500 mg by mouth daily., Disp: , Rfl:    VITAMIN D PO, Take 5,000 Units by mouth daily. , Disp: , Rfl:   Social History   Tobacco Use  Smoking Status Former   Packs/day: 0.75   Years: 8.00   Pack years: 6.00   Types: Cigarettes   Quit date: 12/01/1990   Years since quitting: 29.8  Smokeless Tobacco Never  Tobacco Comments   quit smoking in the 1990s    Allergies  Allergen Reactions   Lyrica [Pregabalin] Shortness Of Breath and Swelling    lower extremity edema and weight gain   Belsomra [Suvorexant] Other (See Comments)    unknown   Morphine And Related Itching    Upper torso   Sulfamethoxazole-Trimethoprim Itching and Rash    Bactrim   Tape Itching and Rash    Please use "paper" tape Rash if left on longer than 24 hrs   Objective:  There were no vitals filed for this visit. There is no height or weight on file to calculate BMI. Constitutional Well developed. Well nourished.  Vascular Foot warm and well perfused. Capillary refill normal to all digits.   Neurologic Normal speech. Oriented to person, place, and time. Epicritic sensation to light touch grossly present bilaterally.  Dermatologic Skin healing well without signs of infection.  Skin edges well coapted without signs of infection.  Orthopedic: Tenderness to palpation noted about the surgical site.   Radiographs: 3 views of skeletally mature the right foot: No previous orthopedic hardware noted.  Second digit in good correction alignment noted the pin is intact.  No signs of loosening or backing out noted. Assessment:   1. Pain from implanted hardware, initial encounter   2. Hammertoe of second toe of right foot   3. Status post foot surgery    Plan:  Patient was evaluated and treated and all questions answered.  S/p foot surgery right -Progressing as expected post-operatively. -XR: See above -WB Status: Weightbearing as tolerated in cam boot -Sutures: Intact.  No clinical signs of dehiscence noted.  No complication noted. -Medications: None -Foot redressed.  No follow-ups on file.

## 2020-09-27 ENCOUNTER — Other Ambulatory Visit: Payer: Self-pay | Admitting: Podiatry

## 2020-09-27 MED ORDER — OXYCODONE-ACETAMINOPHEN 5-325 MG PO TABS: 1.0000 | ORAL_TABLET | ORAL | 0 refills | Status: DC | PRN

## 2020-10-05 ENCOUNTER — Other Ambulatory Visit: Payer: Self-pay

## 2020-10-05 ENCOUNTER — Ambulatory Visit (INDEPENDENT_AMBULATORY_CARE_PROVIDER_SITE_OTHER): Payer: PPO | Admitting: Podiatry

## 2020-10-05 DIAGNOSIS — Z9889 Other specified postprocedural states: Secondary | ICD-10-CM

## 2020-10-05 DIAGNOSIS — T85848A Pain due to other internal prosthetic devices, implants and grafts, initial encounter: Secondary | ICD-10-CM

## 2020-10-05 DIAGNOSIS — M2041 Other hammer toe(s) (acquired), right foot: Secondary | ICD-10-CM

## 2020-10-05 MED ORDER — OXYCODONE-ACETAMINOPHEN 5-325 MG PO TABS: 1.0000 | ORAL_TABLET | ORAL | 0 refills | Status: DC | PRN

## 2020-10-05 NOTE — Progress Notes (Signed)
Subjective:  Patient ID: Debbie Pineda, female    DOB: 1960-09-16,  MRN: [id number]  Chief Complaint  Patient presents with   Routine Post Op    Pos DOS 8.22.22    DOS: 09/12/2020 Procedure: Right revisional second digit hammertoe with removal of previous painful orthopedic hardware  60 y.o. female returns for post-op check.  Patient states she is doing well.  Pain is being managed by pain medication.  She is weightbearing to the right lower extremity with a cam boot.  She denies any other acute complaints the pins are intact bandages clean dry and intact  Review of Systems: Negative except as noted in the HPI. Denies N/V/F/Ch.  Past Medical History:  Diagnosis Date   ADD (attention deficit disorder)    Arthritis    "all over"   Cerebral infarction ([place]) 10/30/2011   Cerebrovascular disease 08/14/2016   Chronic back pain    "all over"   Chronic low back pain 25/95/6387   Complication of anesthesia    tends to have hypotension when NPO and post-anesthesia   Constipation    takes stool softener daily   Degenerative disk disease    Degenerative joint disease    DVT (deep venous thrombosis) ([place]) 2014   RLE   Family history of adverse reaction to anesthesia    a family member woke up during surgery; "think it was my mom"   Fibromyalgia    Generalized osteoarthritis of multiple sites 11/04/2013   GERD (gastroesophageal reflux disease)    Heart palpitations 12/14/2013   resolved   History of blood clots    superficial   Hypoglycemia    Hypothyroid    takes Synthroid daily   Incomplete emptying of bladder    Insomnia    takes Trazodone nightly   Iron deficiency anemia    takes Ferrous Sulfate daily   Joint pain    Joint swelling    knees and ankles   Memory disorder 08/14/2016   Morbid obesity (HCC)    Nausea    takes Zofran as needed.Seeing GI doc   Neck pain 05/25/2016   OSA on CPAP    tested more than 5 yrs ago.     Osteoarthritis    Osteopenia    in feet    PFO (patent foramen ovale)    no murmur   Pre-diabetes    Primary osteoarthritis of both feet 05/29/2016   Right bunionectomy August 2017 by Dr. Sharol Given   Scoliosis    Skin abnormality 02/04/2020   raised area on lip   Sleep apnea    mild osa per pt   Stroke [facility]) "several"last 2014   right foot weakness; memory issues, black spot right visual field since" (03/23/2015)   Thrombophlebitis    Trochanteric bursitis of both hips 05/25/2016   Unilateral primary osteoarthritis, right knee 05/29/2016   Urinary urgency 04/19/2016   Vein disorder 11/29/2010   varicose veins both legs   Wears glasses     Current Outpatient Medications:    oxyCODONE-acetaminophen (PERCOCET) 5-325 MG tablet, Take 1 tablet by mouth every 4 (four) hours as needed for severe pain., Disp: 30 tablet, Rfl: 0   amoxicillin (AMOXIL) 500 MG capsule, Take 2,000 mg by mouth See admin instructions. Take 2000 mg by mouth 1 hour prior to dental appointment, Disp: , Rfl:    baclofen (LIORESAL) 10 MG tablet, Take 10 mg by mouth 3 (three) times daily. , Disp: , Rfl: 1   bisacodyl (DULCOLAX) 5 MG EC  tablet, Take 5 mg by mouth daily as needed for moderate constipation., Disp: , Rfl:    Cyanocobalamin (VITAMIN B-12) 5000 MCG SUBL, Place 5,000 mcg under the tongue daily. , Disp: , Rfl:    diclofenac sodium (VOLTAREN) 1 % GEL, Apply 4 g topically 4 (four) times daily as needed (PAIN)., Disp: 5 Tube, Rfl: 1   docusate sodium (COLACE) 100 MG capsule, Take 100 mg by mouth as needed for mild constipation., Disp: , Rfl:    donepezil (ARICEPT) 10 MG tablet, Take 1 tablet by mouth at bedtime., Disp: 90 tablet, Rfl: 3   DULoxetine (CYMBALTA) 60 MG capsule, Take 60 mg by mouth 2 (two) times daily., Disp: , Rfl: 11   Erenumab-aooe (AIMOVIG) 70 MG/ML SOAJ, Inject 70 mg into the skin every 14 (fourteen) days., Disp: 2.24 mL, Rfl: 11   famotidine (PEPCID) 40 MG tablet, Take 0.5 tablets (20 mg total) by mouth daily., Disp: 90 tablet, Rfl: 3   folic acid  (FOLVITE) 712 MCG tablet, Take 400 mcg by mouth daily., Disp: , Rfl:    gabapentin (NEURONTIN) 100 MG capsule, Take 100 mg by mouth 3 (three) times daily., Disp: , Rfl:    gabapentin (NEURONTIN) 800 MG tablet, Take 800 mg by mouth 3 (three) times daily., Disp: , Rfl:    IBUPROFEN PO, Take 800 mg by mouth as needed., Disp: , Rfl:    Krill Oil 350 MG CAPS, Take 350 mg by mouth at bedtime. , Disp: , Rfl:    levothyroxine (SYNTHROID) 150 MCG tablet, Take 175 mcg by mouth daily before breakfast., Disp: , Rfl:    Methylfol-Methylcob-Acetylcyst (METAFOLBIC PLUS) 6-2-600 MG TABS, Take 1 tablet by mouth daily., Disp: 30 tablet, Rfl: 11   methylphenidate (CONCERTA) 27 MG PO CR tablet, Take 1 tablet (27 mg total) by mouth 2 (two) times daily., Disp: 60 tablet, Rfl: 0   mirabegron ER (MYRBETRIQ) 50 MG TB24 tablet, Take 50 mg by mouth daily., Disp: , Rfl:    Multiple Minerals-Vitamins (CAL-MAG-ZINC-D PO), Take 3 tablets by mouth daily. , Disp: , Rfl:    Multiple Vitamins-Minerals (ONE-A-DAY WOMENS PETITES PO), Take 1 tablet by mouth 2 (two) times daily. , Disp: , Rfl:    NARCAN 4 MG/0.1ML LIQD nasal spray kit, Place 0.4 mg into the nose once., Disp: , Rfl:    omeprazole (PRILOSEC) 40 MG capsule, Take 1 capsule by mouth daily before breakfast., Disp: 90 capsule, Rfl: 1   ondansetron (ZOFRAN-ODT) 4 MG disintegrating tablet, Take 4 mg by mouth every 8 (eight) hours as needed for nausea or vomiting., Disp: , Rfl:    oxyCODONE-acetaminophen (PERCOCET) 10-325 MG tablet, Take 1 tablet by mouth every 4 (four) hours as needed for pain., Disp: 30 tablet, Rfl: 0   oxyCODONE-acetaminophen (PERCOCET) 5-325 MG tablet, Take 1-2 tablets by mouth every 4 (four) hours as needed for severe pain., Disp: 30 tablet, Rfl: 0   oxyCODONE-acetaminophen (PERCOCET) 5-325 MG tablet, Take 1 tablet by mouth every 4 (four) hours as needed for severe pain., Disp: 60 tablet, Rfl: 0   Pseudoephedrine HCl (SUDAFED PO), Take by mouth as needed.,  Disp: , Rfl:    Rimegepant Sulfate (NURTEC) 75 MG TBDP, Take 75 mg by mouth daily as needed., Disp: 8 tablet, Rfl: 5   rivaroxaban (XARELTO) 20 MG TABS tablet, Take 1 tablet (20 mg total) by mouth at bedtime., Disp: , Rfl:    Simethicone (PHAZYME ULTRA STRENGTH) 180 MG CAPS, Take 1 capsule (180 mg total) by mouth 3 (  three) times daily as needed., Disp: , Rfl: 0   tapentadol HCl (NUCYNTA) 75 MG tablet, Take 75 mg by mouth 3 (three) times daily., Disp: , Rfl:    tiZANidine (ZANAFLEX) 4 MG tablet, Take 1 tablet (4 mg total) by mouth every 6 (six) hours as needed for muscle spasms., Disp: 30 tablet, Rfl: 0   topiramate (TOPAMAX) 100 MG tablet, Take 1 tablet (100 mg total) by mouth 2 (two) times daily., Disp: 180 tablet, Rfl: 3   traZODone (DESYREL) 100 MG tablet, Take 200 mg by mouth at bedtime., Disp: , Rfl:    trospium (SANCTURA) 20 MG tablet, Take 20 mg by mouth 2 (two) times daily., Disp: , Rfl:    Turmeric (QC TUMERIC COMPLEX PO), Take by mouth 2 (two) times daily., Disp: , Rfl:    vitamin C (ASCORBIC ACID) 500 MG tablet, Take 500 mg by mouth daily., Disp: , Rfl:    VITAMIN D PO, Take 5,000 Units by mouth daily. , Disp: , Rfl:   Social History   Tobacco Use  Smoking Status Former   Packs/day: 0.75   Years: 8.00   Pack years: 6.00   Types: Cigarettes   Quit date: 12/01/1990   Years since quitting: 29.8  Smokeless Tobacco Never  Tobacco Comments   quit smoking in the 1990s    Allergies  Allergen Reactions   Lyrica [Pregabalin] Shortness Of Breath and Swelling    lower extremity edema and weight gain   Belsomra [Suvorexant] Other (See Comments)    unknown   Morphine And Related Itching    Upper torso   Sulfamethoxazole-Trimethoprim Itching and Rash    Bactrim   Tape Itching and Rash    Please use "paper" tape Rash if left on longer than 24 hrs   Objective:  There were no vitals filed for this visit. There is no height or weight on file to calculate BMI. Constitutional Well  developed. Well nourished.  Vascular Foot warm and well perfused. Capillary refill normal to all digits.   Neurologic Normal speech. Oriented to person, place, and time. Epicritic sensation to light touch grossly present bilaterally.  Dermatologic Skin completely epithelialized.  No clinical signs of dehiscence or complication noted.  Pin is intact.  Orthopedic: Mild tenderness to palpation noted about the surgical site.   Radiographs: 3 views of skeletally mature the right foot: No previous orthopedic hardware noted.  Second digit in good correction alignment noted the pin is intact.  No signs of loosening or backing out noted. Assessment:   1. Pain from implanted hardware, initial encounter   2. Hammertoe of second toe of right foot   3. Status post foot surgery     Plan:  Patient was evaluated and treated and all questions answered.  S/p foot surgery right -Progressing as expected post-operatively. -XR: See above -WB Status: Weightbearing as tolerated in cam boot -Sutures: Removed no clinical signs of dehiscence noted.  No complication noted.  We will plan on removing the pin during next visit -Medications: Percocet 5 was refilled -Foot redressed.  No follow-ups on file.

## 2020-10-06 DIAGNOSIS — M797 Fibromyalgia: Secondary | ICD-10-CM | POA: Diagnosis not present

## 2020-10-06 DIAGNOSIS — R8290 Unspecified abnormal findings in urine: Secondary | ICD-10-CM | POA: Diagnosis not present

## 2020-10-06 DIAGNOSIS — Z6833 Body mass index (BMI) 33.0-33.9, adult: Secondary | ICD-10-CM | POA: Diagnosis not present

## 2020-10-06 DIAGNOSIS — N952 Postmenopausal atrophic vaginitis: Secondary | ICD-10-CM | POA: Diagnosis not present

## 2020-10-06 DIAGNOSIS — Z23 Encounter for immunization: Secondary | ICD-10-CM | POA: Diagnosis not present

## 2020-10-06 DIAGNOSIS — R300 Dysuria: Secondary | ICD-10-CM | POA: Diagnosis not present

## 2020-10-06 DIAGNOSIS — E6609 Other obesity due to excess calories: Secondary | ICD-10-CM | POA: Diagnosis not present

## 2020-10-10 DIAGNOSIS — H01001 Unspecified blepharitis right upper eyelid: Secondary | ICD-10-CM | POA: Diagnosis not present

## 2020-10-12 DIAGNOSIS — L821 Other seborrheic keratosis: Secondary | ICD-10-CM | POA: Diagnosis not present

## 2020-10-12 DIAGNOSIS — L2089 Other atopic dermatitis: Secondary | ICD-10-CM | POA: Diagnosis not present

## 2020-10-12 DIAGNOSIS — L710 Perioral dermatitis: Secondary | ICD-10-CM | POA: Diagnosis not present

## 2020-10-13 ENCOUNTER — Other Ambulatory Visit: Payer: Self-pay | Admitting: Neurology

## 2020-10-13 ENCOUNTER — Other Ambulatory Visit: Payer: Self-pay | Admitting: Podiatry

## 2020-10-13 MED ORDER — OXYCODONE-ACETAMINOPHEN 5-325 MG PO TABS: 1.0000 | ORAL_TABLET | ORAL | 0 refills | Status: AC | PRN

## 2020-10-20 ENCOUNTER — Telehealth: Payer: Self-pay | Admitting: *Deleted

## 2020-10-20 NOTE — Telephone Encounter (Signed)
Returned the call to patient, no answer, left vmessage to call back to confirm a sooner appointment (10/21/20 @10 :00)w/ Dr Prudence Davidson to have pin evaluated.

## 2020-10-20 NOTE — Telephone Encounter (Signed)
Patient is calling because part of her pin may have broken off, got it caught trying to take bedroom shoes off. Please advise,please schedule sooner appointment to evaluate.

## 2020-10-20 NOTE — Telephone Encounter (Signed)
Patient has called back to confirm appointment for 10/21/20 @ 10:00,said that the pin has broken off,will bring with her for visit..  I explained to her to keep toe padded with gauzes and put her boot back on for protection until she her visit on tomorrow.

## 2020-10-21 ENCOUNTER — Encounter: Payer: Self-pay | Admitting: Podiatry

## 2020-10-21 ENCOUNTER — Ambulatory Visit (INDEPENDENT_AMBULATORY_CARE_PROVIDER_SITE_OTHER): Payer: PPO | Admitting: Podiatry

## 2020-10-21 ENCOUNTER — Ambulatory Visit (INDEPENDENT_AMBULATORY_CARE_PROVIDER_SITE_OTHER): Payer: PPO

## 2020-10-21 ENCOUNTER — Other Ambulatory Visit: Payer: Self-pay

## 2020-10-21 DIAGNOSIS — M2041 Other hammer toe(s) (acquired), right foot: Secondary | ICD-10-CM | POA: Diagnosis not present

## 2020-10-21 DIAGNOSIS — Z472 Encounter for removal of internal fixation device: Secondary | ICD-10-CM

## 2020-10-21 NOTE — Progress Notes (Addendum)
This patient presents to the office saying she had difficulty with her K-wire second toe right foot.  She had revisional surgery for her hammer toe on 09/12/20.  She said she pulled her pin out at home trying to take off her bedroom shoes.  She presents to the office with her wire in a bag.  She presents for evaluation and treatment.  Neurovascular status intact for this patient.  Patient has minimal swelling second toe right foot.  No pain noted to her toe.    K-wire breaking. Internal fixation device complication  ROV.  Examination of second toe reveals swelling to second toe but no palpable pain noted.  X-rays were taken and read.  There is good alignment of the bone in the second tor right foot.  There is a remnant K wire noted in the second metatarsal right foot.  This wire is centered down her second metatarsal.  Discussed this condition with this patient.  Told her that five weeks was an acceptable time to pull the pin.  Told her that the remnant K wire should not cause her pain or problems in the future.  She requested a letter stating there is a wire buried in her foot for traveling purposes.  She was told to follow up with Dr.  Posey Pronto at her regularly scheduled appointment next week. If this condition  becomes very painful, the patient was told to contact this office or go to the Emergency Department at the hospital.  Gardiner Barefoot DPM

## 2020-10-26 ENCOUNTER — Other Ambulatory Visit: Payer: Self-pay

## 2020-10-26 ENCOUNTER — Ambulatory Visit (INDEPENDENT_AMBULATORY_CARE_PROVIDER_SITE_OTHER): Payer: PPO

## 2020-10-26 ENCOUNTER — Ambulatory Visit (INDEPENDENT_AMBULATORY_CARE_PROVIDER_SITE_OTHER): Payer: PPO | Admitting: Podiatry

## 2020-10-26 DIAGNOSIS — Z969 Presence of functional implant, unspecified: Secondary | ICD-10-CM

## 2020-10-26 DIAGNOSIS — M2041 Other hammer toe(s) (acquired), right foot: Secondary | ICD-10-CM

## 2020-10-27 ENCOUNTER — Other Ambulatory Visit: Payer: Self-pay

## 2020-10-27 DIAGNOSIS — Z9889 Other specified postprocedural states: Secondary | ICD-10-CM

## 2020-10-27 MED ORDER — LIDOCAINE 5 % EX PTCH: 1.0000 | MEDICATED_PATCH | CUTANEOUS | 3 refills | Status: DC

## 2020-10-27 MED ORDER — OXYCODONE-ACETAMINOPHEN 5-325 MG PO TABS: 1.0000 | ORAL_TABLET | ORAL | 0 refills | Status: DC | PRN

## 2020-10-27 MED ORDER — LIDOCAINE 5 % EX OINT: 1.0000 "application " | TOPICAL_OINTMENT | CUTANEOUS | 0 refills | Status: DC | PRN

## 2020-10-27 MED ORDER — LIDOCAINE 5 % EX PTCH: 1.0000 | MEDICATED_PATCH | CUTANEOUS | Status: DC

## 2020-10-28 NOTE — Progress Notes (Signed)
Subjective:  Patient ID: Debbie Pineda, female    DOB: 05-Jul-1960,  MRN: [id number]  Chief Complaint  Patient presents with   Routine Post Op    POV #3 DOS 09/12/2020 RT REMOVAL OF PAINFUL HARDWARE & RT HAMMERTOE REPAIR W/FIXATION    DOS: 09/12/2020 Procedure: Right revisional second digit hammertoe with removal of previous painful orthopedic hardware  60 y.o. female returns for post-op check.  Patient states she is doing well.  She states that she had an episode where the pin broke she was able to remove the entirety of the distal pain however the proximal pin is still left in the foot and is well.  She saw Dr. Sharyon Cable when the incident happened.  She denies any other acute complaints mild pain  Review of Systems: Negative except as noted in the HPI. Denies N/V/F/Ch.  Past Medical History:  Diagnosis Date   ADD (attention deficit disorder)    Arthritis    "all over"   Cerebral infarction ([place]) 10/30/2011   Cerebrovascular disease 08/14/2016   Chronic back pain    "all over"   Chronic low back pain 15/17/6160   Complication of anesthesia    tends to have hypotension when NPO and post-anesthesia   Constipation    takes stool softener daily   Degenerative disk disease    Degenerative joint disease    DVT (deep venous thrombosis) ([place]) 2014   RLE   Family history of adverse reaction to anesthesia    a family member woke up during surgery; "think it was my mom"   Fibromyalgia    Generalized osteoarthritis of multiple sites 11/04/2013   GERD (gastroesophageal reflux disease)    Heart palpitations 12/14/2013   resolved   History of blood clots    superficial   Hypoglycemia    Hypothyroid    takes Synthroid daily   Incomplete emptying of bladder    Insomnia    takes Trazodone nightly   Iron deficiency anemia    takes Ferrous Sulfate daily   Joint pain    Joint swelling    knees and ankles   Memory disorder 08/14/2016   Morbid obesity (HCC)    Nausea    takes Zofran as  needed.Seeing GI doc   Neck pain 05/25/2016   OSA on CPAP    tested more than 5 yrs ago.     Osteoarthritis    Osteopenia    in feet   PFO (patent foramen ovale)    no murmur   Pre-diabetes    Primary osteoarthritis of both feet 05/29/2016   Right bunionectomy August 2017 by Dr. Sharol Given   Scoliosis    Skin abnormality 02/04/2020   raised area on lip   Sleep apnea    mild osa per pt   Stroke [facility]) "several"last 2014   right foot weakness; memory issues, black spot right visual field since" (03/23/2015)   Thrombophlebitis    Trochanteric bursitis of both hips 05/25/2016   Unilateral primary osteoarthritis, right knee 05/29/2016   Urinary urgency 04/19/2016   Vein disorder 11/29/2010   varicose veins both legs   Wears glasses     Current Outpatient Medications:    oxyCODONE-acetaminophen (PERCOCET) 5-325 MG tablet, Take 1 tablet by mouth every 4 (four) hours as needed for severe pain., Disp: 30 tablet, Rfl: 0   amoxicillin (AMOXIL) 500 MG capsule, Take 2,000 mg by mouth See admin instructions. Take 2000 mg by mouth 1 hour prior to dental appointment, Disp: , Rfl:  baclofen (LIORESAL) 10 MG tablet, Take 10 mg by mouth 3 (three) times daily. , Disp: , Rfl: 1   bisacodyl (DULCOLAX) 5 MG EC tablet, Take 5 mg by mouth daily as needed for moderate constipation., Disp: , Rfl:    Cyanocobalamin (VITAMIN B-12) 5000 MCG SUBL, Place 5,000 mcg under the tongue daily. , Disp: , Rfl:    diclofenac sodium (VOLTAREN) 1 % GEL, Apply 4 g topically 4 (four) times daily as needed (PAIN)., Disp: 5 Tube, Rfl: 1   docusate sodium (COLACE) 100 MG capsule, Take 100 mg by mouth as needed for mild constipation., Disp: , Rfl:    donepezil (ARICEPT) 10 MG tablet, Take 1 tablet by mouth at bedtime., Disp: 90 tablet, Rfl: 3   DULoxetine (CYMBALTA) 60 MG capsule, Take 60 mg by mouth 2 (two) times daily., Disp: , Rfl: 11   Erenumab-aooe (AIMOVIG) 70 MG/ML SOAJ, Inject 70 mg into the skin every 14 (fourteen) days., Disp:  2.24 mL, Rfl: 11   famotidine (PEPCID) 40 MG tablet, Take 0.5 tablets (20 mg total) by mouth daily., Disp: 90 tablet, Rfl: 3   folic acid (FOLVITE) 154 MCG tablet, Take 400 mcg by mouth daily., Disp: , Rfl:    gabapentin (NEURONTIN) 100 MG capsule, Take 100 mg by mouth 3 (three) times daily., Disp: , Rfl:    gabapentin (NEURONTIN) 800 MG tablet, Take 800 mg by mouth 3 (three) times daily., Disp: , Rfl:    IBUPROFEN PO, Take 800 mg by mouth as needed., Disp: , Rfl:    Krill Oil 350 MG CAPS, Take 350 mg by mouth at bedtime. , Disp: , Rfl:    levothyroxine (SYNTHROID) 150 MCG tablet, Take 175 mcg by mouth daily before breakfast., Disp: , Rfl:    lidocaine (LIDODERM) 5 %, Place 1 patch onto the skin daily. Remove & Discard patch within 12 hours or as directed by MD, Disp: 30 patch, Rfl: 3   Methylfol-Methylcob-Acetylcyst (METAFOLBIC PLUS) 6-2-600 MG TABS, Take 1 tablet by mouth daily., Disp: 30 tablet, Rfl: 11   methylphenidate (CONCERTA) 27 MG PO CR tablet, Take 1 tablet (27 mg total) by mouth 2 (two) times daily., Disp: 60 tablet, Rfl: 0   mirabegron ER (MYRBETRIQ) 50 MG TB24 tablet, Take 50 mg by mouth daily., Disp: , Rfl:    Multiple Minerals-Vitamins (CAL-MAG-ZINC-D PO), Take 3 tablets by mouth daily. , Disp: , Rfl:    Multiple Vitamins-Minerals (ONE-A-DAY WOMENS PETITES PO), Take 1 tablet by mouth 2 (two) times daily. , Disp: , Rfl:    NARCAN 4 MG/0.1ML LIQD nasal spray kit, Place 0.4 mg into the nose once., Disp: , Rfl:    omeprazole (PRILOSEC) 40 MG capsule, Take 1 capsule by mouth daily before breakfast., Disp: 90 capsule, Rfl: 1   ondansetron (ZOFRAN-ODT) 4 MG disintegrating tablet, Take 4 mg by mouth every 8 (eight) hours as needed for nausea or vomiting., Disp: , Rfl:    oxyCODONE-acetaminophen (PERCOCET) 10-325 MG tablet, Take 1 tablet by mouth every 4 (four) hours as needed for pain., Disp: 30 tablet, Rfl: 0   oxyCODONE-acetaminophen (PERCOCET) 5-325 MG tablet, Take 1-2 tablets by mouth  every 4 (four) hours as needed for severe pain., Disp: 30 tablet, Rfl: 0   oxyCODONE-acetaminophen (PERCOCET) 5-325 MG tablet, Take 1 tablet by mouth every 4 (four) hours as needed for severe pain., Disp: 60 tablet, Rfl: 0   oxyCODONE-acetaminophen (PERCOCET) 5-325 MG tablet, Take 1 tablet by mouth every 4 (four) hours as needed for severe  pain., Disp: 30 tablet, Rfl: 0   Pseudoephedrine HCl (SUDAFED PO), Take by mouth as needed., Disp: , Rfl:    Rimegepant Sulfate (NURTEC) 75 MG TBDP, Take 75 mg by mouth daily as needed., Disp: 8 tablet, Rfl: 5   rivaroxaban (XARELTO) 20 MG TABS tablet, Take 1 tablet (20 mg total) by mouth at bedtime., Disp: , Rfl:    Simethicone (PHAZYME ULTRA STRENGTH) 180 MG CAPS, Take 1 capsule (180 mg total) by mouth 3 (three) times daily as needed., Disp: , Rfl: 0   tapentadol HCl (NUCYNTA) 75 MG tablet, Take 75 mg by mouth 3 (three) times daily., Disp: , Rfl:    tiZANidine (ZANAFLEX) 4 MG tablet, Take 1 tablet (4 mg total) by mouth every 6 (six) hours as needed for muscle spasms., Disp: 30 tablet, Rfl: 0   topiramate (TOPAMAX) 100 MG tablet, Take 1 tablet by mouth twice daily., Disp: 180 tablet, Rfl: 1   traZODone (DESYREL) 100 MG tablet, Take 200 mg by mouth at bedtime., Disp: , Rfl:    trospium (SANCTURA) 20 MG tablet, Take 20 mg by mouth 2 (two) times daily., Disp: , Rfl:    Turmeric (QC TUMERIC COMPLEX PO), Take by mouth 2 (two) times daily., Disp: , Rfl:    vitamin C (ASCORBIC ACID) 500 MG tablet, Take 500 mg by mouth daily., Disp: , Rfl:    VITAMIN D PO, Take 5,000 Units by mouth daily. , Disp: , Rfl:   Social History   Tobacco Use  Smoking Status Former   Packs/day: 0.75   Years: 8.00   Pack years: 6.00   Types: Cigarettes   Quit date: 12/01/1990   Years since quitting: 29.9  Smokeless Tobacco Never  Tobacco Comments   quit smoking in the 1990s    Allergies  Allergen Reactions   Lyrica [Pregabalin] Shortness Of Breath and Swelling    lower extremity  edema and weight gain   Belsomra [Suvorexant] Other (See Comments)    unknown   Morphine And Related Itching    Upper torso   Sulfamethoxazole-Trimethoprim Itching and Rash    Bactrim   Tape Itching and Rash    Please use "paper" tape Rash if left on longer than 24 hrs   Objective:  There were no vitals filed for this visit. There is no height or weight on file to calculate BMI. Constitutional Well developed. Well nourished.  Vascular Foot warm and well perfused. Capillary refill normal to all digits.   Neurologic Normal speech. Oriented to person, place, and time. Epicritic sensation to light touch grossly present bilaterally.  Dermatologic Skin completely epithelialized.  No clinical signs of dehiscence or complication noted.  Pin is intact.  Orthopedic: Mild tenderness to palpation noted about the surgical site.   Radiographs: 3 views of skeletally mature the right foot: No previous orthopedic hardware noted.  Proximal aspect of the pin is broken and well buried within the medullary canal of the second metatarsal.  No interference noted at the second metatarsophalangeal joint. Assessment:   1. Presence of retained hardware     Plan:  Patient was evaluated and treated and all questions answered.  S/p foot surgery right -Progressing as expected post-operatively. -XR: See above -WB Status: Weightbearing as tolerated in cam boot -Sutures: None -Medications: Percocet 5 was refilled and lidocaine patch   Broken orthopedic hardware -Patient was seen by Dr. Prudence Davidson when she broke the orthopedic hardware.  At this time the proximal segment of the pin is well buried within  the central aspect of the metatarsal.  No crepitus was clinically appreciated with range of motion of the second metatarsophalangeal joint.  The distal aspect of the pin was completely removed by the patient in its entirety. -I discussed with the patient that it is not recommended to surgically remove pain that is  well buried within the medullary canal of the bone as the risk of removing it is worse than leaving it buried within the bone.  I discussed this with the patient in extensive detail patient states understanding and will for now we will like to leave the implanted hardware in.  No follow-ups on file.

## 2020-11-03 ENCOUNTER — Telehealth: Payer: Self-pay | Admitting: *Deleted

## 2020-11-03 ENCOUNTER — Telehealth: Payer: Self-pay | Admitting: Podiatry

## 2020-11-03 DIAGNOSIS — G4720 Circadian rhythm sleep disorder, unspecified type: Secondary | ICD-10-CM | POA: Diagnosis not present

## 2020-11-03 DIAGNOSIS — E6609 Other obesity due to excess calories: Secondary | ICD-10-CM | POA: Diagnosis not present

## 2020-11-03 DIAGNOSIS — Z6834 Body mass index (BMI) 34.0-34.9, adult: Secondary | ICD-10-CM | POA: Diagnosis not present

## 2020-11-03 DIAGNOSIS — M519 Unspecified thoracic, thoracolumbar and lumbosacral intervertebral disc disorder: Secondary | ICD-10-CM | POA: Diagnosis not present

## 2020-11-03 DIAGNOSIS — M797 Fibromyalgia: Secondary | ICD-10-CM | POA: Diagnosis not present

## 2020-11-03 DIAGNOSIS — F1120 Opioid dependence, uncomplicated: Secondary | ICD-10-CM | POA: Diagnosis not present

## 2020-11-03 DIAGNOSIS — N6321 Unspecified lump in the left breast, upper outer quadrant: Secondary | ICD-10-CM | POA: Diagnosis not present

## 2020-11-03 MED ORDER — OXYCODONE-ACETAMINOPHEN 5-325 MG PO TABS: 1.0000 | ORAL_TABLET | ORAL | 0 refills | Status: DC | PRN

## 2020-11-03 NOTE — Telephone Encounter (Signed)
Pt called office to request refill of pain medication as she is leaving tomorrow to go out of town.

## 2020-11-03 NOTE — Addendum Note (Signed)
Addended by: Boneta Lucks on: 11/03/2020 03:26 PM   Modules accepted: Orders

## 2020-11-03 NOTE — Telephone Encounter (Signed)
[place] is calling and wanted to let the physician know the patient is taking other opioids including one prescribed(Percocet-30 tablets qh). Did you still want to add? Please advise.

## 2020-11-08 NOTE — Telephone Encounter (Signed)
Returned call to [employer] and gave approval per Dr Posey Pronto to fill Percocet-30 tablets.

## 2020-11-11 ENCOUNTER — Telehealth: Payer: Self-pay | Admitting: Podiatry

## 2020-11-11 ENCOUNTER — Other Ambulatory Visit: Payer: Self-pay | Admitting: Podiatry

## 2020-11-11 MED ORDER — OXYCODONE-ACETAMINOPHEN 5-325 MG PO TABS: 1.0000 | ORAL_TABLET | ORAL | 0 refills | Status: DC | PRN

## 2020-11-11 NOTE — Telephone Encounter (Signed)
Pt called office to request medication refill.  Thanks

## 2020-11-15 ENCOUNTER — Encounter: Payer: Self-pay | Admitting: Neurology

## 2020-11-15 ENCOUNTER — Other Ambulatory Visit: Payer: Self-pay

## 2020-11-15 ENCOUNTER — Ambulatory Visit (INDEPENDENT_AMBULATORY_CARE_PROVIDER_SITE_OTHER): Payer: PPO | Admitting: Neurology

## 2020-11-15 ENCOUNTER — Other Ambulatory Visit: Payer: Self-pay | Admitting: Podiatry

## 2020-11-15 VITALS — BP 121/77 | HR 79 | Ht 65.0 in | Wt 215.0 lb

## 2020-11-15 DIAGNOSIS — G43909 Migraine, unspecified, not intractable, without status migrainosus: Secondary | ICD-10-CM | POA: Diagnosis not present

## 2020-11-15 DIAGNOSIS — E7212 Methylenetetrahydrofolate reductase deficiency: Secondary | ICD-10-CM | POA: Diagnosis not present

## 2020-11-15 DIAGNOSIS — R413 Other amnesia: Secondary | ICD-10-CM

## 2020-11-15 DIAGNOSIS — Z969 Presence of functional implant, unspecified: Secondary | ICD-10-CM

## 2020-11-15 MED ORDER — NURTEC 75 MG PO TBDP: 75.0000 mg | ORAL_TABLET | Freq: Every day | ORAL | 5 refills | Status: DC | PRN

## 2020-11-15 MED ORDER — GABAPENTIN 800 MG PO TABS: 800.0000 mg | ORAL_TABLET | Freq: Four times a day (QID) | ORAL | 1 refills | Status: DC

## 2020-11-15 MED ORDER — EMGALITY 120 MG/ML ~~LOC~~ SOSY: 120.0000 mg | PREFILLED_SYRINGE | SUBCUTANEOUS | 4 refills | Status: DC

## 2020-11-15 NOTE — Progress Notes (Signed)
Reason for visit: Cerebrovascular disease, methylenetetrahydrofolate reductase deficiency, mild memory disturbance, history of seizures, migraine headache  Debbie Pineda is an 60 y.o. 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  She is on vitamin replacement therapy.  She does have a history of seizures but she has been well controlled with this.  She mainly is bothered by her migraine headaches that are not fully responsive to Aimovig.  The patient is having on average 7 days with headache a month.  The patient feels like her headaches are becoming more frequent over time.  She has Nurtec to take which also is helpful.  She has had some recent foot surgery on the right and has had some significant pain mainly on the dorsum of the foot.  She does have a burning and sometimes sharp shooting pain.  The pain may be worse with walking but also bothers her at night.  She claims at times light touch on the dorsum of the foot results in pain.  She returns to this office for an evaluation.  She currently is on gabapentin taking 900 mg 3 times daily.  She believes that her memory issues have been relatively stable over time.  Past Medical History:  Diagnosis Date   ADD (attention deficit disorder)    Arthritis    "all over"   Cerebral infarction ([place]) 10/30/2011   Cerebrovascular disease 08/14/2016   Chronic back pain    "all over"   Chronic low back pain 60/73/7106   Complication of anesthesia    tends to have hypotension when NPO and post-anesthesia   Constipation    takes stool softener daily   Degenerative disk disease    Degenerative joint disease    DVT (deep venous thrombosis) ([place]) 2014   RLE   Family history of adverse reaction to anesthesia    a family member woke up during surgery; "think it was my mom"    Fibromyalgia    Generalized osteoarthritis of multiple sites 11/04/2013   GERD (gastroesophageal reflux disease)    Heart palpitations 12/14/2013   resolved   History of blood clots    superficial   Hypoglycemia    Hypothyroid    takes Synthroid daily   Incomplete emptying of bladder    Insomnia    takes Trazodone nightly   Iron deficiency anemia    takes Ferrous Sulfate daily   Joint pain    Joint swelling    knees and ankles   Memory disorder 08/14/2016   Morbid obesity (HCC)    Nausea    takes Zofran as needed.Seeing GI doc   Neck pain 05/25/2016   OSA on CPAP    tested more than 5 yrs ago.     Osteoarthritis    Osteopenia    in feet   PFO (patent foramen ovale)    no murmur   Pre-diabetes    Primary osteoarthritis of both feet 05/29/2016   Right bunionectomy August 2017 by Dr. Sharol Given   Scoliosis    Skin abnormality 02/04/2020   raised area on lip   Sleep apnea    mild osa per pt   Stroke [facility]) "several"last 2014   right foot weakness; memory issues, black spot right visual field since" (03/23/2015)   Thrombophlebitis    Trochanteric bursitis of both hips 05/25/2016   Unilateral primary osteoarthritis, right  knee 05/29/2016   Urinary urgency 04/19/2016   Vein disorder 11/29/2010   varicose veins both legs   Wears glasses     Past Surgical History:  Procedure Laterality Date   BIOPSY  11/14/2018   Procedure: BIOPSY;  Surgeon: Rogene Houston, MD;  [facility];  Service: Endoscopy;;  esophagusgastric   BONE EXCISION Right 08/29/2017   Procedure: right trapezium excision;  Surgeon: Daryll Brod, MD;  Location: [place];  Service: Orthopedics;  Laterality: Right;   BUNIONECTOMY Right 08/2015   CARDIAC CATHETERIZATION     2008.  "it was fine" (not sure why she had it done, and doesn't know where)   CARPOMETACARPEL SUSPENSION PLASTY Right 08/29/2017   Procedure: SUSPENSION PLASTY RIGHT THUMB;  Surgeon: Daryll Brod, MD;  Location: [place];  Service: Orthopedics;  Laterality: Right;   COLONOSCOPY N/A 03/25/2013   Procedure: COLONOSCOPY;  Surgeon: Rogene Houston, MD;  [facility];  Service: Endoscopy;  Laterality: N/A;  930   ERCP  06/02/2020   ESOPHAGOGASTRODUODENOSCOPY     ESOPHAGOGASTRODUODENOSCOPY (EGD) WITH PROPOFOL N/A 11/14/2018   Procedure: ESOPHAGOGASTRODUODENOSCOPY (EGD) WITH PROPOFOL;  Surgeon: Rogene Houston, MD;  [facility];  Service: Endoscopy;  Laterality: N/A;  1:55pm-office moved to 11:00am/pt notified to arrive at 9:30am per [place]     "took fallopian tubes out"   [place] Right 02/12/2020   Procedure: HALLUX INTERPHANGEAL JOINT  FUSION;  Surgeon: Felipa Furnace, DPM;  Location: [place];  Service: Podiatry;  Laterality: Right;   HAMMER TOE SURGERY Right 02/12/2020   Procedure: SECOND AND THIRD HAMMER TOE CORRECTION; CAPSULOTOMY SECOND INTERPHALANGEAL JOINT;  Surgeon: Felipa Furnace, DPM;  Location: [place];  Service: Podiatry;  Laterality: Right;   JOINT REPLACEMENT     bil knee    KNEE ARTHROSCOPY Left    KNEE ARTHROSCOPY W/ ACL RECONSTRUCTION Right yrs ago   "added pins"   LAPAROSCOPIC CHOLECYSTECTOMY  ~ 2001   ROUX-EN-Y GASTRIC BYPASS  11/20/2010   [place]   SPINAL CORD STIMULATOR INSERTION N/A 04/18/2017   Procedure: LUMBAR SPINAL CORD STIMULATOR INSERTION;  Surgeon: Clydell Hakim, MD;  Location: [place];  Service: Neurosurgery;  Laterality: N/A;  LUMBAR SPINAL CORD STIMULATOR INSERTION   stomach stent  04/28/2020   TENDON TRANSFER Right 08/29/2017   Procedure: right abductor pollicis longus transfer;  Surgeon: Daryll Brod, MD;  Location: [place];  Service: Orthopedics;  Laterality: Right;   TOTAL KNEE ARTHROPLASTY Left 03/23/2015   Procedure: TOTAL KNEE ARTHROPLASTY;  Surgeon: Newt Minion, MD;  Location: [place];  Service: Orthopedics;  Laterality: Left;   TOTAL KNEE ARTHROPLASTY Right 08/15/2016    Procedure: RIGHT TOTAL KNEE ARTHROPLASTY, REMOVAL ACL SCREWS;  Surgeon: Newt Minion, MD;  Location: [place];  Service: Orthopedics;  Laterality: Right;   TOTAL KNEE ARTHROPLASTY WITH HARDWARE REMOVAL Right    VAGINAL HYSTERECTOMY     tah/bso   VARICOSE VEIN SURGERY Right X 2   WEIL OSTEOTOMY Right 02/12/2020   Procedure: DOUBLE L OSTEOTOMY;  Surgeon: Felipa Furnace, DPM;  Location: [place];  Service: Podiatry;  Laterality: Right;    Family History  Problem Relation Age of Onset   Heart disease Father    Cancer Father    Parkinson's disease Father    Cancer Mother        skin cancer    Myasthenia  gravis Mother    Breast cancer Mother    Heart disease Brother    Cancer Brother    Diabetes Brother    Stroke Brother    Heart disease Sister    Heart attack Sister    Cancer Maternal Grandfather    Hypothyroidism Daughter    Hypertension Other    Colon cancer Neg Hx     Social history:  reports that she quit smoking about 29 years ago. Her smoking use included cigarettes. She has a 6.00 pack-year smoking history. She has never used smokeless tobacco. She reports that she does not drink alcohol and does not use drugs.    Allergies  Allergen Reactions   Lyrica [Pregabalin] Shortness Of Breath and Swelling    lower extremity edema and weight gain   Belsomra [Suvorexant] Other (See Comments)    unknown   Morphine And Related Itching    Upper torso   Sulfamethoxazole-Trimethoprim Itching and Rash    Bactrim   Tape Itching and Rash    Please use "paper" tape Rash if left on longer than 24 hrs    Medications:  Prior to Admission medications   Medication Sig Start Date End Date Taking? Authorizing Provider  amoxicillin (AMOXIL) 500 MG capsule Take 2,000 mg by mouth See admin instructions. Take 2000 mg by mouth 1 hour prior to dental appointment    [provider]  baclofen (LIORESAL) 10 MG tablet Take 10 mg by mouth 3 (three) times daily.  06/30/17    [provider]  bisacodyl (DULCOLAX) 5 MG EC tablet Take 5 mg by mouth daily as needed for moderate constipation.    [provider]  Cyanocobalamin (VITAMIN B-12) 5000 MCG SUBL Place 5,000 mcg under the tongue daily.     [provider]  diclofenac sodium (VOLTAREN) 1 % GEL Apply 4 g topically 4 (four) times daily as needed (PAIN). 02/06/18   Jessy Oto, MD  docusate sodium (COLACE) 100 MG capsule Take 100 mg by mouth as needed for mild constipation.    [provider]  donepezil (ARICEPT) 10 MG tablet Take 1 tablet by mouth at bedtime. 04/18/20   [name], MD  DULoxetine (CYMBALTA) 60 MG capsule Take 60 mg by mouth 2 (two) times daily. 01/17/15   [provider]  Erenumab-aooe (AIMOVIG) 70 MG/ML SOAJ Inject 70 mg into the skin every 14 (fourteen) days. 06/06/20   Suzzanne Cloud, NP  famotidine (PEPCID) 40 MG tablet Take 0.5 tablets (20 mg total) by mouth daily. 02/15/20   Rogene Houston, MD  folic acid (FOLVITE) 381 MCG tablet Take 400 mcg by mouth daily.    [provider]  gabapentin (NEURONTIN) 100 MG capsule Take 100 mg by mouth 3 (three) times daily. 06/17/20   [provider]  gabapentin (NEURONTIN) 800 MG tablet Take 800 mg by mouth 3 (three) times daily. 06/07/20   [provider]  IBUPROFEN PO Take 800 mg by mouth as needed.    [provider]  Javier Docker Oil 350 MG CAPS Take 350 mg by mouth at bedtime.     [provider]  levothyroxine (SYNTHROID) 150 MCG tablet Take 175 mcg by mouth daily before breakfast.    [provider]  lidocaine (LIDODERM) 5 % Place 1 patch onto the skin daily. Remove & Discard patch within 12 hours or as directed by MD 10/27/20   Felipa Furnace, DPM  Methylfol-Methylcob-Acetylcyst (METAFOLBIC PLUS) 6-2-600 MG TABS Take 1 tablet  by mouth daily. 05/18/20   Suzzanne Cloud, NP  methylphenidate (CONCERTA) 27 MG PO CR tablet Take 1 tablet (27 mg total) by mouth 2  (two) times daily. 01/21/19   Donnal Moat T, PA-C  mirabegron ER (MYRBETRIQ) 50 MG TB24 tablet Take 50 mg by mouth daily.    [provider]  Multiple Minerals-Vitamins (CAL-MAG-ZINC-D PO) Take 3 tablets by mouth daily.     [provider]  Multiple Vitamins-Minerals (ONE-A-DAY WOMENS PETITES PO) Take 1 tablet by mouth 2 (two) times daily.     [provider]  NARCAN 4 MG/0.1ML LIQD nasal spray kit Place 0.4 mg into the nose once. 11/14/16   [provider]  omeprazole (PRILOSEC) 40 MG capsule Take 1 capsule by mouth daily before breakfast. 08/11/20   Rehman, Mechele Dawley, MD  ondansetron (ZOFRAN-ODT) 4 MG disintegrating tablet Take 4 mg by mouth every 8 (eight) hours as needed for nausea or vomiting.    [provider]  oxyCODONE-acetaminophen (PERCOCET) 10-325 MG tablet Take 1 tablet by mouth every 4 (four) hours as needed for pain. 09/20/20   Felipa Furnace, DPM  oxyCODONE-acetaminophen (PERCOCET) 5-325 MG tablet Take 1-2 tablets by mouth every 4 (four) hours as needed for severe pain. 09/12/20   Felipa Furnace, DPM  oxyCODONE-acetaminophen (PERCOCET) 5-325 MG tablet Take 1 tablet by mouth every 4 (four) hours as needed for severe pain. 09/27/20   Felipa Furnace, DPM  oxyCODONE-acetaminophen (PERCOCET) 5-325 MG tablet Take 1 tablet by mouth every 4 (four) hours as needed for severe pain. 10/05/20   Felipa Furnace, DPM  oxyCODONE-acetaminophen (PERCOCET) 5-325 MG tablet Take 1 tablet by mouth every 4 (four) hours as needed for severe pain. 10/27/20   Felipa Furnace, DPM  oxyCODONE-acetaminophen (PERCOCET) 5-325 MG tablet Take 1 tablet by mouth every 4 (four) hours as needed for severe pain. 11/03/20   Felipa Furnace, DPM  oxyCODONE-acetaminophen (PERCOCET) 5-325 MG tablet Take 1 tablet by mouth every 4 (four) hours as needed for severe pain. 11/11/20   Felipa Furnace, DPM  Pseudoephedrine HCl (SUDAFED PO) Take by mouth as needed.    [provider]   Rimegepant Sulfate (NURTEC) 75 MG TBDP Take 75 mg by mouth daily as needed. 05/16/20   Suzzanne Cloud, NP  rivaroxaban (XARELTO) 20 MG TABS tablet Take 1 tablet (20 mg total) by mouth at bedtime. 11/15/18   Rehman, Mechele Dawley, MD  Simethicone (PHAZYME ULTRA STRENGTH) 180 MG CAPS Take 1 capsule (180 mg total) by mouth 3 (three) times daily as needed. 09/22/19   Rogene Houston, MD  tapentadol HCl (NUCYNTA) 75 MG tablet Take 75 mg by mouth 3 (three) times daily. 11/25/19   [provider]  tiZANidine (ZANAFLEX) 4 MG tablet Take 1 tablet (4 mg total) by mouth every 6 (six) hours as needed for muscle spasms. 09/04/19   Jessy Oto, MD  topiramate (TOPAMAX) 100 MG tablet Take 1 tablet by mouth twice daily. 10/13/20   [name], MD  traZODone (DESYREL) 100 MG tablet Take 200 mg by mouth at bedtime.    [provider]  trospium (SANCTURA) 20 MG tablet Take 20 mg by mouth 2 (two) times daily. 06/07/20   [provider]  Turmeric (QC TUMERIC COMPLEX PO) Take by mouth 2 (two) times daily.    [provider]  vitamin C (ASCORBIC ACID) 500 MG tablet Take 500 mg by mouth daily.    [provider]  VITAMIN D PO Take 5,000 Units by mouth daily.     [provider]    ROS:  Out of a complete 14 system review of symptoms, the patient complains only of the following symptoms, and all other reviewed systems are negative.  Foot pain Headache Mild memory problems  There were no vitals taken for this visit.  Physical Exam  General: The patient is alert and cooperative at the time of the examination.  Skin: No significant peripheral edema is noted.   Neurologic Exam  Mental status: The patient is alert and oriented x 3 at the time of the examination. The patient has apparent normal recent and remote memory, with an apparently normal attention span and concentration ability.  Mini-Mental status examination done today shows a total score  29/30.   Cranial nerves: Facial symmetry is present. Speech is normal, no aphasia or dysarthria is noted. Extraocular movements are full. Visual fields are notable for a relative right superior quadrantanopsia.  Motor: The patient has good strength in all 4 extremities.  Sensory examination: Soft touch sensation is symmetric on the face, arms, and legs.  Coordination: The patient has good finger-nose-finger and heel-to-shin bilaterally.  Gait and station: The patient has a normal gait. Tandem gait is slightly unstable, the patient may use a cane for ambulation. Romberg is negative. No drift is seen.  Reflexes: Deep tendon reflexes are symmetric.   Assessment/Plan:  1.  Cerebrovascular disease, methylenetetrahydrofolate reductase deficiency  2.  Migraine headache  3.  Right foot pain, questionable mild complex regional pain syndrome  4.  History of seizures  The patient will be increased on her gabapentin taking 800 mg 4 times daily.  She will consult with her podiatrist about potentially getting a pain center referral if it is felt that complex regional pain syndrome may be present.  The patient appears to be stable with the memory.  We will continue Aricept.  A prescription was given for the gabapentin and to switch patient from [place] to [facility] to see if her headaches can be better controlled with this medication.  She will follow-up here in 4 months, in the future she can be followed through Dr. Billey Gosling.  Jill Alexanders MD 11/15/2020 10:29 AM  [facility] [address] [place], [place] [address]  Phone [phone] Fax [phone]

## 2020-11-17 DIAGNOSIS — R3915 Urgency of urination: Secondary | ICD-10-CM | POA: Diagnosis not present

## 2020-11-17 DIAGNOSIS — N898 Other specified noninflammatory disorders of vagina: Secondary | ICD-10-CM | POA: Diagnosis not present

## 2020-11-17 DIAGNOSIS — N941 Unspecified dyspareunia: Secondary | ICD-10-CM | POA: Diagnosis not present

## 2020-11-24 NOTE — Progress Notes (Deleted)
Office Visit Note  Patient: Debbie Pineda             Date of Birth: 1960-07-26           MRN: [id number]             PCP: Jake Samples, PA-C Referring: Jake Samples, [place]* Visit Date: 12/08/2020 Occupation: @GUAROCC @  Subjective:    History of Present Illness: Debbie Pineda is a 60 y.o. female with history of fibromyalgia, osteoarthritis, and DDD.  She takes Cymbalta and gabapentin as prescribed.  She continues to follow-up with pain management.  Activities of Daily Living:  Patient reports morning stiffness for *** {minute/hour:19697}.   Patient {ACTIONS;DENIES/REPORTS:21021675::"Denies"} nocturnal pain.  Difficulty dressing/grooming: {ACTIONS;DENIES/REPORTS:21021675::"Denies"} Difficulty climbing stairs: {ACTIONS;DENIES/REPORTS:21021675::"Denies"} Difficulty getting out of chair: {ACTIONS;DENIES/REPORTS:21021675::"Denies"} Difficulty using hands for taps, buttons, cutlery, and/or writing: {ACTIONS;DENIES/REPORTS:21021675::"Denies"}  No Rheumatology ROS completed.   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   GERD (gastroesophageal reflux disease) 09/22/2019   SSBE (short-segment Barrett's esophagus) 09/22/2019   Flatulence 09/22/2019   Migraine 11/11/2018   Abdominal pain, epigastric 10/14/2018   LUQ pain 10/14/2018   Attention deficit hyperactivity disorder (ADHD) 01/12/2018   Insomnia 01/12/2018   Elevated liver enzymes 09/10/2017   History of diabetes mellitus 06/06/2017   Primary osteoarthritis of right hand 06/06/2017   Transaminasemia    TIA (transient ischemic attack) 05/01/2017   Dysphasia 05/01/2017   Chronic pain syndrome 05/01/2017   Confusion    Presence of right artificial knee joint 09/17/2016   H/O total knee replacement, right 08/15/2016    Presence of retained hardware    Memory disorder 08/14/2016   Cerebrovascular disease 08/14/2016   Unilateral primary osteoarthritis, right knee 05/29/2016   Primary osteoarthritis of both feet 05/29/2016   Trochanteric bursitis of both hips 05/25/2016   Neck pain 05/25/2016   Urinary urgency 04/19/2016   Chronic low back pain 01/11/2016   Depression 11/29/2015   Total knee replacement status 03/23/2015   Heart palpitations 12/14/2013   Generalized osteoarthritis of multiple sites 11/04/2013   Cerebral infarction ([place]) 10/30/2011   PFO (patent foramen ovale) 10/30/2011   Bradycardia 10/30/2011   Chest pain 10/30/2011   Hypothyroid    Thrombophlebitis    Sleep apnea    Fibromyalgia    Status post bariatric surgery 12/07/2010   Vein disorder 11/29/2010   S/P total hysterectomy and bilateral salpingo-oophorectomy 11/29/2010   S/P exploratory laparotomy 11/29/2010   S/P cholecystectomy 11/29/2010   S/P ACL surgery 11/29/2010   Morbid obesity ([place]) 11/10/2010    Past Medical History:  Diagnosis Date   ADD (attention deficit disorder)    Arthritis    "all over"   Cerebral infarction ([place]) 10/30/2011   Cerebrovascular disease 08/14/2016   Chronic back pain    "all over"   Chronic low back pain 81/19/1478   Complication of anesthesia    tends to have hypotension when NPO and post-anesthesia   Constipation    takes stool softener daily   Degenerative disk disease    Degenerative joint disease    DVT (deep venous thrombosis) ([place]) 2014   RLE   Family history of adverse reaction to anesthesia    a family member woke up during surgery; "think it was my mom"   Fibromyalgia    Generalized osteoarthritis of multiple sites 11/04/2013  GERD (gastroesophageal reflux disease)    Heart palpitations 12/14/2013   resolved   History of blood clots    superficial   Hypoglycemia    Hypothyroid    takes Synthroid daily   Incomplete emptying of bladder    Insomnia    takes Trazodone  nightly   Iron deficiency anemia    takes Ferrous Sulfate daily   Joint pain    Joint swelling    knees and ankles   Memory disorder 08/14/2016   Morbid obesity ([place])    Nausea    takes Zofran as needed.Seeing GI doc   Neck pain 05/25/2016   OSA on CPAP    tested more than 5 yrs ago.     Osteoarthritis    Osteopenia    in feet   PFO (patent foramen ovale)    no murmur   Pre-diabetes    Primary osteoarthritis of both feet 05/29/2016   Right bunionectomy August 2017 by Dr. Sharol Given   Scoliosis    Skin abnormality 02/04/2020   raised area on lip   Sleep apnea    mild osa per pt   Stroke [facility]) "several"last 2014   right foot weakness; memory issues, black spot right visual field since" (03/23/2015)   Thrombophlebitis    Trochanteric bursitis of both hips 05/25/2016   Unilateral primary osteoarthritis, right knee 05/29/2016   Urinary urgency 04/19/2016   Vein disorder 11/29/2010   varicose veins both legs   Wears glasses     Family History  Problem Relation Age of Onset   Heart disease Father    Cancer Father    Parkinson's disease Father    Cancer Mother        skin cancer    Myasthenia gravis Mother    Breast cancer Mother    Heart disease Brother    Cancer Brother    Diabetes Brother    Stroke Brother    Heart disease Sister    Heart attack Sister    Cancer Maternal Grandfather    Hypothyroidism Daughter    Hypertension Other    Colon cancer Neg Hx    Past Surgical History:  Procedure Laterality Date   BIOPSY  11/14/2018   Procedure: BIOPSY;  Surgeon: Rogene Houston, MD;  [facility];  Service: Endoscopy;;  esophagusgastric   BONE EXCISION Right 08/29/2017   Procedure: right trapezium excision;  Surgeon: Daryll Brod, MD;  Location: [place];  Service: Orthopedics;  Laterality: Right;   BUNIONECTOMY Right 08/2015   CARDIAC CATHETERIZATION     2008.  "it was fine" (not sure why she had it done, and doesn't know where)   CARPOMETACARPEL  SUSPENSION PLASTY Right 08/29/2017   Procedure: SUSPENSION PLASTY RIGHT THUMB;  Surgeon: Daryll Brod, MD;  Location: [place];  Service: Orthopedics;  Laterality: Right;   COLONOSCOPY N/A 03/25/2013   Procedure: COLONOSCOPY;  Surgeon: Rogene Houston, MD;  [facility];  Service: Endoscopy;  Laterality: N/A;  930   ERCP  06/02/2020   ESOPHAGOGASTRODUODENOSCOPY     ESOPHAGOGASTRODUODENOSCOPY (EGD) WITH PROPOFOL N/A 11/14/2018   Procedure: ESOPHAGOGASTRODUODENOSCOPY (EGD) WITH PROPOFOL;  Surgeon: Rogene Houston, MD;  [facility];  Service: Endoscopy;  Laterality: N/A;  1:55pm-office moved to 11:00am/pt notified to arrive at 9:30am per KF   EXPLORATORY LAPAROTOMY     "took fallopian tubes out"   [place] Right 02/12/2020   Procedure: [place];  Surgeon: Posey Pronto,  Thomasene Lot, DPM;  Location: [place];  Service: Podiatry;  Laterality: Right;   HAMMER TOE SURGERY Right 02/12/2020   Procedure: SECOND AND THIRD HAMMER TOE CORRECTION; CAPSULOTOMY SECOND INTERPHALANGEAL JOINT;  Surgeon: Felipa Furnace, DPM;  Location: [place];  Service: Podiatry;  Laterality: Right;   JOINT REPLACEMENT     bil knee    KNEE ARTHROSCOPY Left    KNEE ARTHROSCOPY W/ ACL RECONSTRUCTION Right yrs ago   "added pins"   LAPAROSCOPIC CHOLECYSTECTOMY  ~ 2001   ROUX-EN-Y GASTRIC BYPASS  11/20/2010   [place]   SPINAL CORD STIMULATOR INSERTION N/A 04/18/2017   Procedure: LUMBAR SPINAL CORD STIMULATOR INSERTION;  Surgeon: Clydell Hakim, MD;  Location: [place];  Service: Neurosurgery;  Laterality: N/A;  LUMBAR SPINAL CORD STIMULATOR INSERTION   stomach stent  04/28/2020   TENDON TRANSFER Right 08/29/2017   Procedure: right abductor pollicis longus transfer;  Surgeon: Daryll Brod, MD;  Location: [place];  Service: Orthopedics;  Laterality: Right;   TOTAL KNEE ARTHROPLASTY Left 03/23/2015   Procedure: TOTAL KNEE  ARTHROPLASTY;  Surgeon: Newt Minion, MD;  Location: [place];  Service: Orthopedics;  Laterality: Left;   TOTAL KNEE ARTHROPLASTY Right 08/15/2016   Procedure: RIGHT TOTAL KNEE ARTHROPLASTY, REMOVAL ACL SCREWS;  Surgeon: Newt Minion, MD;  Location: [place];  Service: Orthopedics;  Laterality: Right;   TOTAL KNEE ARTHROPLASTY WITH HARDWARE REMOVAL Right    VAGINAL HYSTERECTOMY     tah/bso   VARICOSE VEIN SURGERY Right X 2   WEIL OSTEOTOMY Right 02/12/2020   Procedure: DOUBLE L OSTEOTOMY;  Surgeon: Felipa Furnace, DPM;  Location: [place];  Service: Podiatry;  Laterality: Right;   Social History   Social History Narrative   Lives with husband   Caffeine use: No soda   Mainly water, drinks decaf tea   Right handed   Immunization History  Administered Date(s) Administered   Influenza Split 11/01/2011, 08/23/2014   Influenza-Unspecified 08/23/2014, 11/22/2017   Moderna Sars-Covid-2 Vaccination 03/16/2019, 04/13/2019   Pneumococcal Polysaccharide-23 04/26/2017   Zoster Recombinat (Shingrix) 07/02/2017, 01/08/2018     Objective: Vital Signs: There were no vitals taken for this visit.   Physical Exam Vitals and nursing note reviewed.  Constitutional:      Appearance: She is well-developed.  HENT:     Head: Normocephalic and atraumatic.  Eyes:     Conjunctiva/sclera: Conjunctivae normal.  Cardiovascular:     Heart sounds: Normal heart sounds.  Pulmonary:     Effort: Pulmonary effort is normal.  Abdominal:     Palpations: Abdomen is soft.  Musculoskeletal:     Cervical back: Normal range of motion.  Skin:    General: Skin is warm and dry.     Capillary Refill: Capillary refill takes less than 2 seconds.  Neurological:     Mental Status: She is alert and oriented to person, place, and time.  Psychiatric:        Behavior: Behavior normal.     Musculoskeletal Exam: ***  CDAI Exam: CDAI Score: -- Patient Global: --; Provider Global: -- Swollen: --; Tender:  -- Joint Exam 12/08/2020   No joint exam has been documented for this visit   There is currently no information documented on the homunculus. Go to the Rheumatology activity and complete the homunculus joint exam.  Investigation: No additional findings.  Imaging: DG Foot Complete Right  Result Date: 11/15/2020 Please see detailed radiograph report in office note.  Recent Labs: Lab Results  Component Value Date   WBC 8.5 07/04/2020   HGB 13.7 07/04/2020   PLT 202 07/04/2020   NA 140 07/04/2020   K 4.5 07/04/2020   CL 109 07/04/2020   CO2 26 07/04/2020   GLUCOSE 79 07/04/2020   BUN 13 07/04/2020   CREATININE 0.96 07/04/2020   BILITOT 0.4 03/02/2020   ALKPHOS 83 03/09/2019   AST 29 03/02/2020   ALT 83 (H) 03/02/2020   PROT 6.1 03/02/2020   ALBUMIN 3.9 03/09/2019   CALCIUM 9.2 07/04/2020   GFRAA 81 10/14/2018    Speciality Comments: No specialty comments available.  Procedures:  No procedures performed Allergies: Lyrica [pregabalin], Belsomra [suvorexant], Morphine and related, Sulfamethoxazole-trimethoprim, and Tape   Assessment / Plan:     Visit Diagnoses: No diagnosis found.  Orders: No orders of the defined types were placed in this encounter.  No orders of the defined types were placed in this encounter.   Face-to-face time spent with patient was *** minutes. Greater than 50% of time was spent in counseling and coordination of care.  Follow-Up Instructions: No follow-ups on file.   Earnestine Mealing, CMA  Note - This record has been created using Editor, commissioning.  Chart creation errors have been sought, but may not always  have been located. Such creation errors do not reflect on  the standard of medical care.

## 2020-11-25 ENCOUNTER — Other Ambulatory Visit: Payer: Self-pay | Admitting: Podiatry

## 2020-11-25 MED ORDER — OXYCODONE-ACETAMINOPHEN 5-325 MG PO TABS: 1.0000 | ORAL_TABLET | ORAL | 0 refills | Status: DC | PRN

## 2020-11-30 ENCOUNTER — Other Ambulatory Visit: Payer: Self-pay

## 2020-11-30 ENCOUNTER — Ambulatory Visit (INDEPENDENT_AMBULATORY_CARE_PROVIDER_SITE_OTHER): Payer: PPO | Admitting: Podiatry

## 2020-11-30 ENCOUNTER — Ambulatory Visit (INDEPENDENT_AMBULATORY_CARE_PROVIDER_SITE_OTHER): Payer: PPO

## 2020-11-30 DIAGNOSIS — Z969 Presence of functional implant, unspecified: Secondary | ICD-10-CM | POA: Diagnosis not present

## 2020-11-30 DIAGNOSIS — G894 Chronic pain syndrome: Secondary | ICD-10-CM

## 2020-11-30 DIAGNOSIS — M778 Other enthesopathies, not elsewhere classified: Secondary | ICD-10-CM | POA: Diagnosis not present

## 2020-12-01 DIAGNOSIS — M797 Fibromyalgia: Secondary | ICD-10-CM | POA: Diagnosis not present

## 2020-12-01 DIAGNOSIS — I10 Essential (primary) hypertension: Secondary | ICD-10-CM | POA: Diagnosis not present

## 2020-12-01 DIAGNOSIS — E6609 Other obesity due to excess calories: Secondary | ICD-10-CM | POA: Diagnosis not present

## 2020-12-01 DIAGNOSIS — G4720 Circadian rhythm sleep disorder, unspecified type: Secondary | ICD-10-CM | POA: Diagnosis not present

## 2020-12-01 DIAGNOSIS — Z9884 Bariatric surgery status: Secondary | ICD-10-CM | POA: Diagnosis not present

## 2020-12-01 DIAGNOSIS — F1120 Opioid dependence, uncomplicated: Secondary | ICD-10-CM | POA: Diagnosis not present

## 2020-12-01 DIAGNOSIS — E782 Mixed hyperlipidemia: Secondary | ICD-10-CM | POA: Diagnosis not present

## 2020-12-01 DIAGNOSIS — M519 Unspecified thoracic, thoracolumbar and lumbosacral intervertebral disc disorder: Secondary | ICD-10-CM | POA: Diagnosis not present

## 2020-12-01 DIAGNOSIS — E119 Type 2 diabetes mellitus without complications: Secondary | ICD-10-CM | POA: Diagnosis not present

## 2020-12-01 DIAGNOSIS — M1711 Unilateral primary osteoarthritis, right knee: Secondary | ICD-10-CM | POA: Diagnosis not present

## 2020-12-01 DIAGNOSIS — Z6835 Body mass index (BMI) 35.0-35.9, adult: Secondary | ICD-10-CM | POA: Diagnosis not present

## 2020-12-02 ENCOUNTER — Telehealth: Payer: Self-pay | Admitting: *Deleted

## 2020-12-02 NOTE — Progress Notes (Signed)
Subjective:  Patient ID: Debbie Pineda, female    DOB: 1960-07-09,  MRN: [id number]  No chief complaint on file.   DOS: 09/12/2020 Procedure: Right revisional second digit hammertoe with removal of previous painful orthopedic hardware  60 y.o. female returns for post-op check.  Patient states she is doing okay.  She still has pain around the surgical site that is outside the realm of surgery.  She states it hurts with ambulation.  She also has secondary complaint of right dorsal midfoot pain that has been going on for quite some time is progressive gotten worse.  She would like to know if she can get a steroid injection.  She is also interested in doing pain management referral. Review of Systems: Negative except as noted in the HPI. Denies N/V/F/Ch.  Past Medical History:  Diagnosis Date   ADD (attention deficit disorder)    Arthritis    "all over"   Cerebral infarction ([place]) 10/30/2011   Cerebrovascular disease 08/14/2016   Chronic back pain    "all over"   Chronic low back pain 64/33/2951   Complication of anesthesia    tends to have hypotension when NPO and post-anesthesia   Constipation    takes stool softener daily   Degenerative disk disease    Degenerative joint disease    DVT (deep venous thrombosis) ([place]) 2014   RLE   Family history of adverse reaction to anesthesia    a family member woke up during surgery; "think it was my mom"   Fibromyalgia    Generalized osteoarthritis of multiple sites 11/04/2013   GERD (gastroesophageal reflux disease)    Heart palpitations 12/14/2013   resolved   History of blood clots    superficial   Hypoglycemia    Hypothyroid    takes Synthroid daily   Incomplete emptying of bladder    Insomnia    takes Trazodone nightly   Iron deficiency anemia    takes Ferrous Sulfate daily   Joint pain    Joint swelling    knees and ankles   Memory disorder 08/14/2016   Morbid obesity ([facility])    Nausea    takes Zofran as needed.Seeing GI doc    Neck pain 05/25/2016   OSA on CPAP    tested more than 5 yrs ago.     Osteoarthritis    Osteopenia    in feet   PFO (patent foramen ovale)    no murmur   Pre-diabetes    Primary osteoarthritis of both feet 05/29/2016   Right bunionectomy August 2017 by Dr. Sharol Given   Scoliosis    Skin abnormality 02/04/2020   raised area on lip   Sleep apnea    mild osa per pt   Stroke [facility]) "several"last 2014   right foot weakness; memory issues, black spot right visual field since" (03/23/2015)   Thrombophlebitis    Trochanteric bursitis of both hips 05/25/2016   Unilateral primary osteoarthritis, right knee 05/29/2016   Urinary urgency 04/19/2016   Vein disorder 11/29/2010   varicose veins both legs   Wears glasses     Current Outpatient Medications:    amoxicillin (AMOXIL) 500 MG capsule, Take 2,000 mg by mouth See admin instructions. Take 2000 mg by mouth 1 hour prior to dental appointment, Disp: , Rfl:    baclofen (LIORESAL) 10 MG tablet, Take 10 mg by mouth 3 (three) times daily. , Disp: , Rfl: 1   Cyanocobalamin (VITAMIN B-12) 5000 MCG SUBL, Place 5,000 mcg under the tongue  daily. , Disp: , Rfl:    diclofenac sodium (VOLTAREN) 1 % GEL, Apply 4 g topically 4 (four) times daily as needed (PAIN)., Disp: 5 Tube, Rfl: 1   docusate sodium (COLACE) 100 MG capsule, Take 100 mg by mouth as needed for mild constipation., Disp: , Rfl:    donepezil (ARICEPT) 10 MG tablet, Take 1 tablet by mouth at bedtime., Disp: 90 tablet, Rfl: 3   DULoxetine (CYMBALTA) 60 MG capsule, Take 60 mg by mouth 2 (two) times daily., Disp: , Rfl: 11   famotidine (PEPCID) 40 MG tablet, Take 0.5 tablets (20 mg total) by mouth daily., Disp: 90 tablet, Rfl: 3   folic acid (FOLVITE) 716 MCG tablet, Take 400 mcg by mouth daily., Disp: , Rfl:    gabapentin (NEURONTIN) 800 MG tablet, Take 1 tablet (800 mg total) by mouth 4 (four) times daily., Disp: 360 tablet, Rfl: 1   Galcanezumab-gnlm (EMGALITY) 120 MG/ML SOSY, Inject 120 mg into the  skin every 30 (thirty) days., Disp: 1.12 mL, Rfl: 4   Krill Oil 350 MG CAPS, Take 350 mg by mouth at bedtime. , Disp: , Rfl:    levothyroxine (SYNTHROID) 150 MCG tablet, Take 150 mcg by mouth daily before breakfast., Disp: , Rfl:    Methylfol-Methylcob-Acetylcyst (METAFOLBIC PLUS) 6-2-600 MG TABS, Take 1 tablet by mouth daily., Disp: 30 tablet, Rfl: 11   methylphenidate (CONCERTA) 27 MG PO CR tablet, Take 1 tablet (27 mg total) by mouth 2 (two) times daily., Disp: 60 tablet, Rfl: 0   mirabegron ER (MYRBETRIQ) 50 MG TB24 tablet, Take 50 mg by mouth daily., Disp: , Rfl:    Multiple Minerals-Vitamins (CAL-MAG-ZINC-D PO), Take 3 tablets by mouth daily. , Disp: , Rfl:    Multiple Vitamins-Minerals (ONE-A-DAY WOMENS PETITES PO), Take 1 tablet by mouth daily as needed., Disp: , Rfl:    NARCAN 4 MG/0.1ML LIQD nasal spray kit, Place 0.4 mg into the nose once., Disp: , Rfl:    omeprazole (PRILOSEC) 40 MG capsule, Take 1 capsule by mouth daily before breakfast., Disp: 90 capsule, Rfl: 1   ondansetron (ZOFRAN-ODT) 4 MG disintegrating tablet, Take 4 mg by mouth every 8 (eight) hours as needed for nausea or vomiting., Disp: , Rfl:    oxyCODONE-acetaminophen (PERCOCET) 5-325 MG tablet, Take 1 tablet by mouth every 4 (four) hours as needed for severe pain., Disp: 30 tablet, Rfl: 0   oxyCODONE-acetaminophen (PERCOCET) 5-325 MG tablet, Take 1 tablet by mouth every 4 (four) hours as needed for severe pain., Disp: 30 tablet, Rfl: 0   Pseudoephedrine HCl (SUDAFED PO), Take by mouth as needed., Disp: , Rfl:    Rimegepant Sulfate (NURTEC) 75 MG TBDP, Take 75 mg by mouth daily as needed., Disp: 8 tablet, Rfl: 5   rivaroxaban (XARELTO) 20 MG TABS tablet, Take 1 tablet (20 mg total) by mouth at bedtime., Disp: , Rfl:    Simethicone (PHAZYME ULTRA STRENGTH) 180 MG CAPS, Take 1 capsule (180 mg total) by mouth 3 (three) times daily as needed., Disp: , Rfl: 0   tapentadol HCl (NUCYNTA) 75 MG tablet, Take 75 mg by mouth 3 (three)  times daily., Disp: , Rfl:    tiZANidine (ZANAFLEX) 4 MG tablet, Take 1 tablet (4 mg total) by mouth every 6 (six) hours as needed for muscle spasms., Disp: 30 tablet, Rfl: 0   topiramate (TOPAMAX) 100 MG tablet, Take 1 tablet by mouth twice daily., Disp: 180 tablet, Rfl: 1   traZODone (DESYREL) 100 MG tablet, Take 200 mg  by mouth at bedtime., Disp: , Rfl:    trospium (SANCTURA) 20 MG tablet, Take 20 mg by mouth 2 (two) times daily., Disp: , Rfl:    vitamin C (ASCORBIC ACID) 500 MG tablet, Take 500 mg by mouth daily., Disp: , Rfl:    VITAMIN D PO, Take 5,000 Units by mouth daily. , Disp: , Rfl:   Social History   Tobacco Use  Smoking Status Former   Packs/day: 0.75   Years: 8.00   Pack years: 6.00   Types: Cigarettes   Quit date: 12/01/1990   Years since quitting: 30.0  Smokeless Tobacco Never  Tobacco Comments   quit smoking in the 1990s    Allergies  Allergen Reactions   Lyrica [Pregabalin] Shortness Of Breath and Swelling    lower extremity edema and weight gain   Belsomra [Suvorexant] Other (See Comments)    unknown   Morphine And Related Itching    Upper torso   Sulfamethoxazole-Trimethoprim Itching and Rash    Bactrim   Tape Itching and Rash    Please use "paper" tape Rash if left on longer than 24 hrs   Objective:  There were no vitals filed for this visit. There is no height or weight on file to calculate BMI. Constitutional Well developed. Well nourished.  Vascular Foot warm and well perfused. Capillary refill normal to all digits.   Neurologic Normal speech. Oriented to person, place, and time. Epicritic sensation to light touch grossly present bilaterally.  Dermatologic Skin completely epithelialized.  No clinical signs of dehiscence or complication noted.  Pin is intact.  Orthopedic: Mild tenderness to palpation noted about the surgical site.   Radiographs: 3 views of skeletally mature the right foot: No previous orthopedic hardware noted.  Proximal aspect  of the pin is broken and well buried within the medullary canal of the second metatarsal.  No interference noted at the second metatarsophalangeal joint.  The pin has not moved. Assessment:   1. Capsulitis of foot, right   2. Chronic pain syndrome     Plan:  Patient was evaluated and treated and all questions answered.  Right dorsal midfoot capsulitis -I explained the patient the etiology of capsulitis worse treatment options were discussed.  Given the amount of pain that she is having I believe she will benefit from steroid injection help decrease acute inflammatory component associate with pain. -A steroid injection was performed at right dorsal midfoot using 1% plain Lidocaine and 10 mg of Kenalog. This was well tolerated.    S/p foot surgery right -Progressing as expected post-operatively. -XR: See above -WB Status: Weightbearing as tolerated in cam boot -Sutures: None -Medications: Percocet 5 was refilled  -Given that she has chronic pain that is outside of the scope of surgery in the setting of possible CRPS I believe patient will benefit from chronic pain management referral for further evaluation and management.  Patient agrees with the plan   Broken orthopedic hardware -Patient was seen by Dr. Prudence Davidson when she broke the orthopedic hardware.  At this time the proximal segment of the pin is well buried within the central aspect of the metatarsal.  No crepitus was clinically appreciated with range of motion of the second metatarsophalangeal joint.  The distal aspect of the pin was completely removed by the patient in its entirety. -I discussed with the patient that it is not recommended to surgically remove pain that is well buried within the medullary canal of the bone as the risk of removing it is  worse than leaving it buried within the bone.  I discussed this with the patient in extensive detail patient states understanding and will for now we will like to leave the implanted  hardware in.  No follow-ups on file.

## 2020-12-02 NOTE — Telephone Encounter (Signed)
Faxed pain management referral to [facility] , received confirmation-12/02/20

## 2020-12-02 NOTE — Telephone Encounter (Signed)
-----   Message from Felipa Furnace, DPM sent at 12/02/2020  8:15 AM EST ----- Regarding: Referral to pain management Hi Christpher Stogsdill,   I have a patient that needs to go to the pain management.  I put in an order for referral to pain management.  Would you be able to make sure that she gets set up with an appointment.  Thank you

## 2020-12-08 ENCOUNTER — Ambulatory Visit: Payer: PPO | Admitting: Physician Assistant

## 2020-12-08 DIAGNOSIS — M62838 Other muscle spasm: Secondary | ICD-10-CM

## 2020-12-08 DIAGNOSIS — M5134 Other intervertebral disc degeneration, thoracic region: Secondary | ICD-10-CM

## 2020-12-08 DIAGNOSIS — Z8673 Personal history of transient ischemic attack (TIA), and cerebral infarction without residual deficits: Secondary | ICD-10-CM

## 2020-12-08 DIAGNOSIS — M797 Fibromyalgia: Secondary | ICD-10-CM

## 2020-12-08 DIAGNOSIS — Q2112 Patent foramen ovale: Secondary | ICD-10-CM

## 2020-12-08 DIAGNOSIS — M5136 Other intervertebral disc degeneration, lumbar region: Secondary | ICD-10-CM

## 2020-12-08 DIAGNOSIS — M7061 Trochanteric bursitis, right hip: Secondary | ICD-10-CM

## 2020-12-08 DIAGNOSIS — M19071 Primary osteoarthritis, right ankle and foot: Secondary | ICD-10-CM

## 2020-12-08 DIAGNOSIS — Z9884 Bariatric surgery status: Secondary | ICD-10-CM

## 2020-12-08 DIAGNOSIS — M65341 Trigger finger, right ring finger: Secondary | ICD-10-CM

## 2020-12-08 DIAGNOSIS — Z8672 Personal history of thrombophlebitis: Secondary | ICD-10-CM

## 2020-12-08 DIAGNOSIS — Z96653 Presence of artificial knee joint, bilateral: Secondary | ICD-10-CM

## 2020-12-08 DIAGNOSIS — M19041 Primary osteoarthritis, right hand: Secondary | ICD-10-CM

## 2020-12-08 DIAGNOSIS — Z8669 Personal history of other diseases of the nervous system and sense organs: Secondary | ICD-10-CM

## 2020-12-08 DIAGNOSIS — Z8639 Personal history of other endocrine, nutritional and metabolic disease: Secondary | ICD-10-CM

## 2020-12-09 ENCOUNTER — Other Ambulatory Visit: Payer: Self-pay | Admitting: Podiatry

## 2020-12-09 MED ORDER — OXYCODONE-ACETAMINOPHEN 5-325 MG PO TABS: 1.0000 | ORAL_TABLET | ORAL | 0 refills | Status: DC | PRN

## 2020-12-22 ENCOUNTER — Ambulatory Visit: Payer: PPO | Admitting: Physician Assistant

## 2020-12-22 ENCOUNTER — Other Ambulatory Visit: Payer: Self-pay

## 2020-12-22 ENCOUNTER — Encounter: Payer: Self-pay | Admitting: Physician Assistant

## 2020-12-22 VITALS — BP 109/73 | HR 74 | Ht 65.0 in | Wt 220.6 lb

## 2020-12-22 DIAGNOSIS — M62838 Other muscle spasm: Secondary | ICD-10-CM | POA: Diagnosis not present

## 2020-12-22 DIAGNOSIS — M19041 Primary osteoarthritis, right hand: Secondary | ICD-10-CM

## 2020-12-22 DIAGNOSIS — Z8639 Personal history of other endocrine, nutritional and metabolic disease: Secondary | ICD-10-CM | POA: Diagnosis not present

## 2020-12-22 DIAGNOSIS — Z8669 Personal history of other diseases of the nervous system and sense organs: Secondary | ICD-10-CM

## 2020-12-22 DIAGNOSIS — M797 Fibromyalgia: Secondary | ICD-10-CM | POA: Diagnosis not present

## 2020-12-22 DIAGNOSIS — N941 Unspecified dyspareunia: Secondary | ICD-10-CM | POA: Diagnosis not present

## 2020-12-22 DIAGNOSIS — M7061 Trochanteric bursitis, right hip: Secondary | ICD-10-CM

## 2020-12-22 DIAGNOSIS — M5134 Other intervertebral disc degeneration, thoracic region: Secondary | ICD-10-CM

## 2020-12-22 DIAGNOSIS — Z8672 Personal history of thrombophlebitis: Secondary | ICD-10-CM

## 2020-12-22 DIAGNOSIS — Z96653 Presence of artificial knee joint, bilateral: Secondary | ICD-10-CM | POA: Diagnosis not present

## 2020-12-22 DIAGNOSIS — M19042 Primary osteoarthritis, left hand: Secondary | ICD-10-CM

## 2020-12-22 DIAGNOSIS — M65341 Trigger finger, right ring finger: Secondary | ICD-10-CM

## 2020-12-22 DIAGNOSIS — M7062 Trochanteric bursitis, left hip: Secondary | ICD-10-CM

## 2020-12-22 DIAGNOSIS — M5136 Other intervertebral disc degeneration, lumbar region: Secondary | ICD-10-CM | POA: Diagnosis not present

## 2020-12-22 DIAGNOSIS — M19071 Primary osteoarthritis, right ankle and foot: Secondary | ICD-10-CM | POA: Diagnosis not present

## 2020-12-22 DIAGNOSIS — N3281 Overactive bladder: Secondary | ICD-10-CM | POA: Diagnosis not present

## 2020-12-22 DIAGNOSIS — Q2112 Patent foramen ovale: Secondary | ICD-10-CM

## 2020-12-22 DIAGNOSIS — Z8673 Personal history of transient ischemic attack (TIA), and cerebral infarction without residual deficits: Secondary | ICD-10-CM

## 2020-12-22 DIAGNOSIS — M19072 Primary osteoarthritis, left ankle and foot: Secondary | ICD-10-CM

## 2020-12-22 DIAGNOSIS — Z9884 Bariatric surgery status: Secondary | ICD-10-CM

## 2020-12-22 MED ORDER — LIDOCAINE HCL 1 % IJ SOLN
0.5000 mL | INTRAMUSCULAR | Status: AC | PRN
  Administered 2020-12-22: .5 mL

## 2020-12-22 MED ORDER — TRIAMCINOLONE ACETONIDE 40 MG/ML IJ SUSP
10.0000 mg | INTRAMUSCULAR | Status: AC | PRN
  Administered 2020-12-22: 10 mg via INTRAMUSCULAR

## 2020-12-22 NOTE — Progress Notes (Signed)
Office Visit Note  Patient: Debbie Pineda             Date of Birth: 1960-10-17           MRN: [id number]             PCP: Debbie Samples, PA-C Referring: Debbie Pineda, [place]* Visit Date: 12/22/2020 Occupation: @GUAROCC @  Subjective:  Generalized   History of Present Illness: Debbie Pineda is a 60 y.o. female with history of fibromyalgia and osteoarthritis.  Patient reports that she has been experiencing increased generalized myalgias and muscle tenderness on a daily basis due to fibromyalgia.  She has noticed increased myalgias, arthralgias, and joint stiffness with the cooler weather temperatures.  She is awaiting an appointment with pain management in February 2023.  She remains on Cymbalta, Topamax, gabapentin, Percocet, tizanidine, and baclofen as prescribed.  She has been having to take Zanaflex more frequently due to having more muscle spasms.  She presents today with trapezius muscle tension and tenderness bilaterally.  She requested trigger point injections.  She has not been exercising on a regular basis due to the amount of fatigue she has been experiencing.  She tried water aerobics in the past but had too much fatigue so she discontinued.  She has been stretching on a daily basis.    Activities of Daily Living:  Patient reports morning stiffness for all day. Patient Reports nocturnal pain.  Difficulty dressing/grooming: Denies Difficulty climbing stairs: Denies Difficulty getting out of chair: Denies Difficulty using hands for taps, buttons, cutlery, and/or writing: Denies  Review of Systems  Constitutional:  Positive for fatigue.  HENT:  Positive for mouth dryness. Negative for nose dryness.   Eyes:  Positive for dryness. Negative for pain and itching.  Respiratory:  Negative for shortness of breath and difficulty breathing.   Cardiovascular:  Negative for chest pain and palpitations.  Gastrointestinal:  Positive for constipation. Negative for blood in  stool and diarrhea.  Endocrine: Negative for increased urination.  Genitourinary:  Negative for difficulty urinating.  Musculoskeletal:  Positive for joint pain, joint pain, joint swelling, myalgias, morning stiffness, muscle tenderness and myalgias.  Skin:  Positive for rash. Negative for color change.  Allergic/Immunologic: Negative for susceptible to infections.  Neurological:  Positive for numbness, memory loss and weakness. Negative for dizziness and headaches.  Hematological:  Positive for bruising/bleeding tendency.  Psychiatric/Behavioral:  Negative for confusion.    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   GERD (gastroesophageal reflux disease) 09/22/2019   SSBE (short-segment Barrett's esophagus) 09/22/2019   Flatulence 09/22/2019   Migraine 11/11/2018   Abdominal pain, epigastric 10/14/2018   LUQ pain 10/14/2018   Attention deficit hyperactivity disorder (ADHD) 01/12/2018   Insomnia 01/12/2018   Elevated liver enzymes 09/10/2017   History of diabetes mellitus 06/06/2017   Primary osteoarthritis of right hand 06/06/2017   Transaminasemia    TIA (transient ischemic attack) 05/01/2017   Dysphasia 05/01/2017   Chronic pain syndrome 05/01/2017   Confusion    Presence of right artificial knee joint 09/17/2016   H/O total knee replacement, right 08/15/2016   Presence of retained hardware    Memory disorder 08/14/2016   Cerebrovascular disease 08/14/2016   Unilateral primary osteoarthritis, right knee 05/29/2016   Primary osteoarthritis of both feet 05/29/2016   Trochanteric bursitis of both hips  05/25/2016   Neck pain 05/25/2016   Urinary urgency 04/19/2016   Chronic low back pain 01/11/2016   Depression 11/29/2015   Total knee replacement status 03/23/2015   Heart palpitations  12/14/2013   Generalized osteoarthritis of multiple sites 11/04/2013   Cerebral infarction ([place]) 10/30/2011   PFO (patent foramen ovale) 10/30/2011   Bradycardia 10/30/2011   Chest pain 10/30/2011   Hypothyroid    Thrombophlebitis    Sleep apnea    Fibromyalgia    Status post bariatric surgery 12/07/2010   Vein disorder 11/29/2010   S/P total hysterectomy and bilateral salpingo-oophorectomy 11/29/2010   S/P exploratory laparotomy 11/29/2010   S/P cholecystectomy 11/29/2010   S/P ACL surgery 11/29/2010   Morbid obesity ([place]) 11/10/2010    Past Medical History:  Diagnosis Date   ADD (attention deficit disorder)    Arthritis    "all over"   Cerebral infarction ([place]) 10/30/2011   Cerebrovascular disease 08/14/2016   Chronic back pain    "all over"   Chronic low back pain 21/30/8657   Complication of anesthesia    tends to have hypotension when NPO and post-anesthesia   Constipation    takes stool softener daily   Degenerative disk disease    Degenerative joint disease    DVT (deep venous thrombosis) ([place]) 2014   RLE   Family history of adverse reaction to anesthesia    a family member woke up during surgery; "think it was my mom"   Fibromyalgia    Generalized osteoarthritis of multiple sites 11/04/2013   GERD (gastroesophageal reflux disease)    Heart palpitations 12/14/2013   resolved   History of blood clots    superficial   Hypoglycemia    Hypothyroid    takes Synthroid daily   Incomplete emptying of bladder    Insomnia    takes Trazodone nightly   Iron deficiency anemia    takes Ferrous Sulfate daily   Joint pain    Joint swelling    knees and ankles   Memory disorder 08/14/2016   Morbid obesity ([place])    Nausea    takes Zofran as needed.Seeing GI doc   Neck pain 05/25/2016   OSA on CPAP    tested more than 5 yrs ago.     Osteoarthritis    Osteopenia    in feet   PFO (patent foramen ovale)    no murmur   Pre-diabetes    Primary osteoarthritis of both  feet 05/29/2016   Right bunionectomy August 2017 by Dr. Sharol Given   Scoliosis    Skin abnormality 02/04/2020   raised area on lip   Sleep apnea    mild osa per pt   Stroke [facility]) "several"last 2014   right foot weakness; memory issues, black spot right visual field since" (03/23/2015)   Thrombophlebitis    Trochanteric bursitis of both hips 05/25/2016   Unilateral primary osteoarthritis, right knee 05/29/2016   Urinary urgency 04/19/2016   Vein disorder 11/29/2010   varicose veins both legs   Wears glasses     Family History  Problem Relation Age of Onset   Cancer Mother        skin and breast   Myasthenia gravis Mother    Breast cancer Mother    Heart disease Father    Cancer Father    Parkinson's disease Father    Heart disease Sister    Heart attack Sister    Heart disease Brother    Cancer Brother    Diabetes  Brother    Stroke Brother    Cancer Maternal Grandfather    Hypothyroidism Daughter    Hypertension Other    Colon cancer Neg Hx    Past Surgical History:  Procedure Laterality Date   BIOPSY  11/14/2018   Procedure: BIOPSY;  Surgeon: Rogene Houston, MD;  [facility];  Service: Endoscopy;;  esophagusgastric   BONE EXCISION Right 08/29/2017   Procedure: right trapezium excision;  Surgeon: Daryll Brod, MD;  Location: [place];  Service: Orthopedics;  Laterality: Right;   BUNIONECTOMY Right 08/2015   CARDIAC CATHETERIZATION     2008.  "it was fine" (not sure why she had it done, and doesn't know where)   CARPOMETACARPEL SUSPENSION PLASTY Right 08/29/2017   Procedure: SUSPENSION PLASTY RIGHT THUMB;  Surgeon: Daryll Brod, MD;  Location: [place];  Service: Orthopedics;  Laterality: Right;   COLONOSCOPY N/A 03/25/2013   Procedure: COLONOSCOPY;  Surgeon: Rogene Houston, MD;  [facility];  Service: Endoscopy;  Laterality: N/A;  930   ERCP  06/02/2020   ESOPHAGOGASTRODUODENOSCOPY     ESOPHAGOGASTRODUODENOSCOPY (EGD) WITH  PROPOFOL N/A 11/14/2018   Procedure: ESOPHAGOGASTRODUODENOSCOPY (EGD) WITH PROPOFOL;  Surgeon: Rogene Houston, MD;  [facility];  Service: Endoscopy;  Laterality: N/A;  1:55pm-office moved to 11:00am/pt notified to arrive at 9:30am per [place]     "took fallopian tubes out"   [place] Right 02/12/2020   Procedure: HALLUX INTERPHANGEAL JOINT  FUSION;  Surgeon: Felipa Furnace, DPM;  Location: [place];  Service: Podiatry;  Laterality: Right;   HAMMER TOE SURGERY Right 02/12/2020   Procedure: SECOND AND THIRD HAMMER TOE CORRECTION; CAPSULOTOMY SECOND INTERPHALANGEAL JOINT;  Surgeon: Felipa Furnace, DPM;  Location: [place];  Service: Podiatry;  Laterality: Right;   JOINT REPLACEMENT     bil knee    KNEE ARTHROSCOPY Left    KNEE ARTHROSCOPY W/ ACL RECONSTRUCTION Right yrs ago   "added pins"   LAPAROSCOPIC CHOLECYSTECTOMY  ~ 2001   ROUX-EN-Y GASTRIC BYPASS  11/20/2010   [place]   SPINAL CORD STIMULATOR INSERTION N/A 04/18/2017   Procedure: LUMBAR SPINAL CORD STIMULATOR INSERTION;  Surgeon: Clydell Hakim, MD;  Location: [place];  Service: Neurosurgery;  Laterality: N/A;  LUMBAR SPINAL CORD STIMULATOR INSERTION   stomach stent  04/28/2020   TENDON TRANSFER Right 08/29/2017   Procedure: right abductor pollicis longus transfer;  Surgeon: Daryll Brod, MD;  Location: [place];  Service: Orthopedics;  Laterality: Right;   TOTAL KNEE ARTHROPLASTY Left 03/23/2015   Procedure: TOTAL KNEE ARTHROPLASTY;  Surgeon: Newt Minion, MD;  Location: [place];  Service: Orthopedics;  Laterality: Left;   TOTAL KNEE ARTHROPLASTY Right 08/15/2016   Procedure: RIGHT TOTAL KNEE ARTHROPLASTY, REMOVAL ACL SCREWS;  Surgeon: Newt Minion, MD;  Location: [place];  Service: Orthopedics;  Laterality: Right;   TOTAL KNEE ARTHROPLASTY WITH HARDWARE REMOVAL Right    VAGINAL HYSTERECTOMY     tah/bso   VARICOSE VEIN SURGERY Right X 2   WEIL  OSTEOTOMY Right 02/12/2020   Procedure: DOUBLE L OSTEOTOMY;  Surgeon: Felipa Furnace, DPM;  Location: [place];  Service: Podiatry;  Laterality: Right;   Social History   Social History Narrative   Lives with husband   Caffeine use: No soda   Mainly water, drinks decaf tea   Right handed   Immunization History  Administered Date(s) Administered  Influenza Split 11/01/2011, 08/23/2014   Influenza-Unspecified 08/23/2014, 11/22/2017   Moderna Sars-Covid-2 Vaccination 03/16/2019, 04/13/2019   Pneumococcal Polysaccharide-23 04/26/2017   Zoster Recombinat (Shingrix) 07/02/2017, 01/08/2018     Objective: Vital Signs: BP 109/73 (BP Location: Left Arm, Patient Position: Sitting, Cuff Size: Large)   Pulse 74   Ht 5\' 5"  (1.651 m)   Wt 220 lb 9.6 oz (100.1 kg)   BMI 36.71 kg/m    Physical Exam Vitals and nursing note reviewed.  Constitutional:      Appearance: She is well-developed.  HENT:     Head: Normocephalic and atraumatic.  Eyes:     Conjunctiva/sclera: Conjunctivae normal.  Pulmonary:     Effort: Pulmonary effort is normal.  Abdominal:     Palpations: Abdomen is soft.  Musculoskeletal:     Cervical back: Normal range of motion.  Skin:    General: Skin is warm and dry.     Capillary Refill: Capillary refill takes less than 2 seconds.  Neurological:     Mental Status: She is alert and oriented to person, place, and time.  Psychiatric:        Behavior: Behavior normal.     Musculoskeletal Exam: Generalized hyperalgesia and positive tender points.  C-spine is limited range of motion without rotation.  Trapezius muscle tension and tenderness noted bilaterally.  Painful range of motion of the lumbar spine.  Tenderness over both SI joints.  Shoulder joints, elbow joints, wrist joints, MCPs, PIPs, DIPs have good range of motion with no synovitis.  PIP and DIP thickening consistent with osteoarthritis of both hands.  Complete fist formation bilaterally.  Hip  joints have good range of motion with no groin pain.  Tenderness over bilateral trochanteric bursa.  Bilateral knee replacements have good range of motion with no warmth or effusion.  Ankle joints have good range of motion with tenderness bilaterally.   CDAI Exam: CDAI Score: -- Patient Global: --; Provider Global: -- Swollen: --; Tender: -- Joint Exam 12/22/2020   No joint exam has been documented for this visit   There is currently no information documented on the homunculus. Go to the Rheumatology activity and complete the homunculus joint exam.  Investigation: No additional findings.  Imaging: DG Foot Complete Right  Result Date: 11/30/2020 Please see detailed radiograph report in office note.   Recent Labs: Lab Results  Component Value Date   WBC 8.5 07/04/2020   HGB 13.7 07/04/2020   PLT 202 07/04/2020   NA 140 07/04/2020   K 4.5 07/04/2020   CL 109 07/04/2020   CO2 26 07/04/2020   GLUCOSE 79 07/04/2020   BUN 13 07/04/2020   CREATININE 0.96 07/04/2020   BILITOT 0.4 03/02/2020   ALKPHOS 83 03/09/2019   AST 29 03/02/2020   ALT 83 (H) 03/02/2020   PROT 6.1 03/02/2020   ALBUMIN 3.9 03/09/2019   CALCIUM 9.2 07/04/2020   GFRAA 81 10/14/2018    Speciality Comments: No specialty comments available.  Procedures:  Trigger Point Inj  Date/Time: 12/22/2020 11:00 AM Performed by: Ofilia Neas, PA-C Authorized by: Ofilia Neas, PA-C   Consent Given by:  Patient Site marked: the procedure site was marked   Timeout: prior to procedure the correct patient, procedure, and site was verified   Indications:  Pain Total # of Trigger Points:  2 Location: neck   Needle Size:  27 G Approach:  Dorsal Medications #1:  0.5 mL lidocaine 1 %; 10 mg triamcinolone acetonide 40 MG/ML Medications #2:  0.5  mL lidocaine 1 %; 10 mg triamcinolone acetonide 40 MG/ML Patient tolerance:  Patient tolerated the procedure well with no immediate complications Allergies: Lyrica  [pregabalin], Belsomra [suvorexant], Morphine and related, Sulfamethoxazole-trimethoprim, and Tape   Assessment / Plan:     Visit Diagnoses: Fibromyalgia: She has generalized hyperalgesia and positive tender points on examination today.  She has been experiencing more severe and frequent flares with the cooler weather temperatures.  She is currently experiencing increased arthralgias, myalgias, muscle spasms, and fatigue.  She presents today with trapezius muscle tension and tenderness bilaterally.  She is been experiencing muscle spasms more frequently requiring her to take baclofen 3 times daily and to increase her frequency of tizanidine dosing.  She requested trigger point injections today.  She tolerated procedure well.  Procedure note was completed above.  She is also having discomfort due to trochanter bursitis of both hips.  Discussed the importance of performing stretching exercises on a daily basis.  She remains on Percocet, gabapentin, Topamax, Nucynta, tizanidine, baclofen, Cymbalta, and tizanidine as prescribed.  She is awaiting an appointment with pain management which is scheduled in February 2023.  Discussed that she may benefit from massage therapy or dry needling.  She would also benefit from water therapy or water aerobics.  Discussed the importance of regular exercise and good sleep hygiene.  She will follow-up in the office in 6 months.  Trapezius muscle spasm -She presents today with trapezius muscle tension and tenderness bilaterally.  She has been experiencing more frequent muscle spasms.  She takes baclofen 10 mg 3 times daily on a daily basis and has had to increase the frequency of the use of tizanidine due to increased myalgias and muscle spasms.  She requested bilateral trigger point injections today.  She tolerated the procedure well.  Procedure note was completed above.  Aftercare was discussed.  She was advised to monitor her blood glucose as well as blood pressure closely  following the cortisone injections.  She was advised to notify us if her symptoms persist or worsen.  Discussed the possibility of massage, heat therapy, and dry needling in the future for symptomatic relief.  Plan: Trigger Point Inj  Primary osteoarthritis of both hands: She has PIP and DIP thickening consistent with osteoarthritis of both hands.  No tenderness or inflammation noted on examination.  Discussed the importance of joint protection and muscle strengthening.   Trigger finger, right ring finger: Resolved   Trochanteric bursitis of both hips: She has tenderness over bilateral trochanteric bursa.  Discussed the importance of performing stretching exercises daily.   History of total bilateral knee replacement: Doing well overall.  She has good ROM of both knee replacements on exam.  She walks with a cane to assist with ambulation.   Primary osteoarthritis of both feet: Chronic pain.  Followed by Dr. Posey Pronto.  Discussed the importance of wearing proper fitting shoes.   DDD (degenerative disc disease), thoracic: Chronic pain. Paraspinal muscle tenderness noted.  DDD (degenerative disc disease), lumbar - Followed by Dr. Louanne Skye.  She continues to have chronic lower back pain and stiffness.  She experiences left sided radiculopathy and pain at night. She uses a cane to assist with ambulation.  She does not want to proceed with surgery at this time.  She is awaiting an appointment with pain management in February 2023.   Other medical conditions are listed as follows:   History of diabetes mellitus  History of thrombophlebitis  History of sleep apnea  PFO (patent foramen ovale)  History of cerebral infarction  History of gastric bypass  Orders: Orders Placed This Encounter  Procedures   Trigger Point Inj   No orders of the defined types were placed in this encounter.   Follow-Up Instructions: Return in about 6 months (around 06/22/2021) for Fibromyalgia, Osteoarthritis,  DDD.   Ofilia Neas, PA-C  Note - This record has been created using Dragon software.  Chart creation errors have been sought, but may not always  have been located. Such creation errors do not reflect on  the standard of medical care.

## 2020-12-23 ENCOUNTER — Other Ambulatory Visit: Payer: Self-pay | Admitting: Podiatry

## 2020-12-23 MED ORDER — OXYCODONE-ACETAMINOPHEN 5-325 MG PO TABS: 1.0000 | ORAL_TABLET | ORAL | 0 refills | Status: DC | PRN

## 2020-12-26 DIAGNOSIS — N941 Unspecified dyspareunia: Secondary | ICD-10-CM | POA: Diagnosis not present

## 2020-12-26 DIAGNOSIS — N952 Postmenopausal atrophic vaginitis: Secondary | ICD-10-CM | POA: Diagnosis not present

## 2020-12-26 DIAGNOSIS — E119 Type 2 diabetes mellitus without complications: Secondary | ICD-10-CM | POA: Diagnosis not present

## 2020-12-26 DIAGNOSIS — G4730 Sleep apnea, unspecified: Secondary | ICD-10-CM | POA: Diagnosis not present

## 2020-12-26 DIAGNOSIS — N8110 Cystocele, unspecified: Secondary | ICD-10-CM | POA: Diagnosis not present

## 2020-12-26 DIAGNOSIS — N898 Other specified noninflammatory disorders of vagina: Secondary | ICD-10-CM | POA: Diagnosis not present

## 2020-12-26 DIAGNOSIS — N9419 Other specified dyspareunia: Secondary | ICD-10-CM | POA: Diagnosis not present

## 2020-12-26 DIAGNOSIS — N992 Postprocedural adhesions of vagina: Secondary | ICD-10-CM | POA: Diagnosis not present

## 2020-12-26 DIAGNOSIS — K219 Gastro-esophageal reflux disease without esophagitis: Secondary | ICD-10-CM | POA: Diagnosis not present

## 2020-12-26 DIAGNOSIS — N8112 Cystocele, lateral: Secondary | ICD-10-CM | POA: Diagnosis not present

## 2020-12-31 NOTE — Progress Notes (Signed)
Date:  12/31/2020   ID:  Debbie Pineda, DOB [birth year], MRN [id number]   PCP:  Jake Samples, PA-C  Cardiologist:  Jenkins Rouge, MD New to me  Electrophysiologist:  None   Evaluation Performed:  Follow-Up Visit  Chief Complaint:  PFO  History of Present Illness:    60 y.o. history of factor V Leiden with CVA on chronic xarelto. Some residual right sided weakness ? 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 Lives with husband and cares For her mother Has one daughter lives locally with two grand kids ages 55 and 9   Had some vaginal scarring surgery with some spotting when taking NSAI's so she stopped No cardiac issues   Past Medical History:  Diagnosis Date   ADD (attention deficit disorder)    Arthritis    "all over"   Cerebral infarction ([place]) 10/30/2011   Cerebrovascular disease 08/14/2016   Chronic back pain    "all over"   Chronic low back pain 20/25/4270   Complication of anesthesia    tends to have hypotension when NPO and post-anesthesia   Constipation    takes stool softener daily   Degenerative disk disease    Degenerative joint disease    DVT (deep venous thrombosis) ([place]) 2014   RLE   Family history of adverse reaction to anesthesia    a family member woke up during surgery; "think it was my mom"   Fibromyalgia    Generalized osteoarthritis of multiple sites 11/04/2013   GERD (gastroesophageal reflux disease)    Heart palpitations 12/14/2013   resolved   History of blood clots    superficial   Hypoglycemia    Hypothyroid    takes Synthroid daily   Incomplete emptying of bladder    Insomnia    takes Trazodone nightly    Iron deficiency anemia    takes Ferrous Sulfate daily   Joint pain    Joint swelling    knees and ankles   Memory disorder 08/14/2016   Morbid obesity (HCC)    Nausea    takes Zofran as needed.Seeing GI doc   Neck pain 05/25/2016   OSA on CPAP    tested more than 5 yrs ago.     Osteoarthritis    Osteopenia    in feet   PFO (patent foramen ovale)    no murmur   Pre-diabetes    Primary osteoarthritis of both feet 05/29/2016   Right bunionectomy August 2017 by Dr. Sharol Given   Scoliosis    Skin abnormality 02/04/2020   raised area on lip   Sleep apnea    mild osa per pt   Stroke [facility]) "several"last 2014   right foot weakness; memory issues, black spot right visual field since" (03/23/2015)   Thrombophlebitis    Trochanteric bursitis of both hips 05/25/2016   Unilateral primary osteoarthritis, right knee 05/29/2016   Urinary urgency 04/19/2016   Vein disorder 11/29/2010   varicose veins both legs   Wears glasses    Past Surgical History:  Procedure Laterality Date   BIOPSY  11/14/2018   Procedure: BIOPSY;  Surgeon: Rogene Houston, MD;  [facility];  Service: Endoscopy;;  esophagusgastric   BONE EXCISION Right 08/29/2017   Procedure: right trapezium excision;  Surgeon: Daryll Brod, MD;  Location: [place];  Service: Orthopedics;  Laterality: Right;   BUNIONECTOMY Right 08/2015   CARDIAC CATHETERIZATION     2008.  "it was fine" (not sure why she had it done, and doesn't know where)   CARPOMETACARPEL SUSPENSION PLASTY Right 08/29/2017   Procedure: SUSPENSION PLASTY RIGHT THUMB;  Surgeon: Daryll Brod, MD;  Location: [place];  Service: Orthopedics;  Laterality: Right;   COLONOSCOPY N/A 03/25/2013   Procedure: COLONOSCOPY;  Surgeon: Rogene Houston, MD;  [facility];  Service: Endoscopy;  Laterality: N/A;  930   ERCP  06/02/2020   ESOPHAGOGASTRODUODENOSCOPY     ESOPHAGOGASTRODUODENOSCOPY (EGD) WITH PROPOFOL N/A 11/14/2018   Procedure:  ESOPHAGOGASTRODUODENOSCOPY (EGD) WITH PROPOFOL;  Surgeon: Rogene Houston, MD;  [facility];  Service: Endoscopy;  Laterality: N/A;  1:55pm-office moved to 11:00am/pt notified to arrive at 9:30am per B and E     "took fallopian tubes out"   [place] Right 02/12/2020   Procedure: HALLUX INTERPHANGEAL JOINT  FUSION;  Surgeon: Felipa Furnace, DPM;  Location: [place];  Service: Podiatry;  Laterality: Right;   HAMMER TOE SURGERY Right 02/12/2020   Procedure: SECOND AND THIRD HAMMER TOE CORRECTION; CAPSULOTOMY SECOND INTERPHALANGEAL JOINT;  Surgeon: Felipa Furnace, DPM;  Location: [place];  Service: Podiatry;  Laterality: Right;   JOINT REPLACEMENT     bil knee    KNEE ARTHROSCOPY Left    KNEE ARTHROSCOPY W/ ACL RECONSTRUCTION Right yrs ago   "added pins"   LAPAROSCOPIC CHOLECYSTECTOMY  ~ 2001   ROUX-EN-Y GASTRIC BYPASS  11/20/2010   [place]   SPINAL CORD STIMULATOR INSERTION N/A 04/18/2017   Procedure: LUMBAR SPINAL CORD STIMULATOR INSERTION;  Surgeon: Clydell Hakim, MD;  Location: [place];  Service: Neurosurgery;  Laterality: N/A;  LUMBAR SPINAL CORD STIMULATOR INSERTION   stomach stent  04/28/2020   TENDON TRANSFER Right 08/29/2017   Procedure: right abductor pollicis longus transfer;  Surgeon: Daryll Brod, MD;  Location: [place];  Service: Orthopedics;  Laterality: Right;   TOTAL KNEE ARTHROPLASTY Left 03/23/2015   Procedure: TOTAL KNEE ARTHROPLASTY;  Surgeon: Newt Minion, MD;  Location: [place];  Service: Orthopedics;  Laterality: Left;   TOTAL KNEE ARTHROPLASTY Right 08/15/2016   Procedure: RIGHT TOTAL KNEE ARTHROPLASTY, REMOVAL ACL SCREWS;  Surgeon: Newt Minion, MD;  Location: [place];  Service: Orthopedics;  Laterality: Right;   TOTAL KNEE ARTHROPLASTY WITH HARDWARE REMOVAL Right    VAGINAL HYSTERECTOMY     tah/bso   VARICOSE VEIN SURGERY Right X 2   WEIL OSTEOTOMY Right 02/12/2020   Procedure: DOUBLE  L OSTEOTOMY;  Surgeon: Felipa Furnace, DPM;  Location: [place];  Service: Podiatry;  Laterality: Right;     No outpatient medications have been marked as taking for the 01/09/21 encounter (Appointment) with Josue Hector, MD.     Allergies:   Lyrica [pregabalin], Belsomra [suvorexant], Morphine and related, Sulfamethoxazole-trimethoprim, and Tape   Social History   Tobacco Use   Smoking status: Former    Packs/day: 0.75    Years: 8.00    Pack years: 6.00    Types: Cigarettes    Quit date: 12/01/1990    Years since quitting: 30.1   Smokeless tobacco: Never   Tobacco comments:  quit smoking in the 1990s  Vaping Use   Vaping Use: Never used  Substance Use Topics   Alcohol use: No    Comment: 03/23/2015 "stopped drinking in 2012 w/gastric bypass; drank socially before bypass"   Drug use: No     Family Hx: The patient's family history includes Breast cancer in her mother; Cancer in her brother, father, maternal grandfather, and mother; Diabetes in her brother; Heart attack in her sister; Heart disease in her brother, father, and sister; Hypertension in an other family member; Hypothyroidism in her daughter; Myasthenia gravis in her mother; Parkinson's disease in her father; Stroke in her brother. There is no history of Colon cancer.  ROS:   Please see the history of present illness.     All other systems reviewed and are negative.   Prior CV studies:   The following studies were reviewed today:  TTE 01/18/20  Notes from Dr Raliegh Ip  Labs/Other Tests and Data Reviewed:    EKG:   12/31/2020 NSR rate 66 normal   Recent Labs: 03/02/2020: ALT 83 07/04/2020: BUN 13; Creatinine, Ser 0.96; Hemoglobin 13.7; Platelets 202; Potassium 4.5; Sodium 140; TSH 0.398   Recent Lipid Panel Lab Results  Component Value Date/Time   CHOL 151 05/02/2017 04:19 AM   TRIG 54 05/02/2017 04:19 AM   HDL 74 05/02/2017 04:19 AM   CHOLHDL 2.0 05/02/2017 04:19 AM   LDLCALC 66 05/02/2017  04:19 AM    Wt Readings from Last 3 Encounters:  12/22/20 220 lb 9.6 oz (100.1 kg)  11/15/20 215 lb (97.5 kg)  07/04/20 201 lb (91.2 kg)     Objective:    Vital Signs:  There were no vitals taken for this visit.   Affect appropriate Healthy:  appears stated age 7: normal Neck supple with no adenopathy JVP normal no bruits no thyromegaly Lungs clear with no wheezing and good diaphragmatic motion Heart:  S1/S2 no murmur, no rub, gallop or click PMI normal Abdomen: benighn, BS positve, no tenderness, no AAA no bruit.  No HSM or HJR Distal pulses intact with no bruits No edema Neuro residual right sided weakness from CVA  Skin warm and dry No muscular weakness Post right foot surgery x 2    ASSESSMENT & PLAN:    1.  PFO documented TTE 01/18/20  Has been 9 years since placed on anticoagulation with no recurrence no indication for closure referral    2.  Bilateral venous varicosities: documented reflux has some fibromyalgia and does not like wearing compression stockings stable   3. Thyroid:  On replacement labs with primary   4. Ortho:  Post right bunion  and hammer toe surgery chronic pain    Medication Adjustments/Labs and Tests Ordered: Current medicines are reviewed at length with the patient today.  Concerns regarding medicines are outlined above.   Tests Ordered: See labs above    Medication Changes: No orders of the defined types were placed in this encounter.   Disposition:  Follow up in 1 year(s)  Signed, Jenkins Rouge, MD  12/31/2020 1:45 PM    [facility]

## 2021-01-05 DIAGNOSIS — R7309 Other abnormal glucose: Secondary | ICD-10-CM | POA: Diagnosis not present

## 2021-01-05 DIAGNOSIS — G4720 Circadian rhythm sleep disorder, unspecified type: Secondary | ICD-10-CM | POA: Diagnosis not present

## 2021-01-05 DIAGNOSIS — F1120 Opioid dependence, uncomplicated: Secondary | ICD-10-CM | POA: Diagnosis not present

## 2021-01-05 DIAGNOSIS — E6609 Other obesity due to excess calories: Secondary | ICD-10-CM | POA: Diagnosis not present

## 2021-01-05 DIAGNOSIS — Z6834 Body mass index (BMI) 34.0-34.9, adult: Secondary | ICD-10-CM | POA: Diagnosis not present

## 2021-01-05 DIAGNOSIS — Z9884 Bariatric surgery status: Secondary | ICD-10-CM | POA: Diagnosis not present

## 2021-01-05 DIAGNOSIS — M797 Fibromyalgia: Secondary | ICD-10-CM | POA: Diagnosis not present

## 2021-01-09 ENCOUNTER — Encounter: Payer: Self-pay | Admitting: Cardiovascular Disease

## 2021-01-09 ENCOUNTER — Ambulatory Visit: Payer: PPO | Admitting: Cardiovascular Disease

## 2021-01-09 ENCOUNTER — Other Ambulatory Visit: Payer: Self-pay

## 2021-01-09 VITALS — BP 98/60 | HR 97 | Ht 65.0 in | Wt 222.0 lb

## 2021-01-09 DIAGNOSIS — Q2112 Patent foramen ovale: Secondary | ICD-10-CM | POA: Diagnosis not present

## 2021-01-09 DIAGNOSIS — Z7901 Long term (current) use of anticoagulants: Secondary | ICD-10-CM | POA: Diagnosis not present

## 2021-01-09 NOTE — Patient Instructions (Signed)
Medication Instructions:  Your physician recommends that you continue on your current medications as directed. Please refer to the Current Medication list given to you today.  *If you need a refill on your cardiac medications before your next appointment, please call your pharmacy*   Lab Work: NONE   If you have labs (blood work) drawn today and your tests are completely normal, you will receive your results only by: . MyChart Message (if you have MyChart) OR . A paper copy in the mail If you have any lab test that is abnormal or we need to change your treatment, we will call you to review the results.   Testing/Procedures: NONE    Follow-Up: At [facility], you and your health needs are our priority.  As part of our continuing mission to provide you with exceptional heart care, we have created designated Provider Care Teams.  These Care Teams include your primary Cardiologist (physician) and Advanced Practice Providers (APPs -  Physician Assistants and Nurse Practitioners) who all work together to provide you with the care you need, when you need it.  We recommend signing up for the patient portal called "MyChart".  Sign up information is provided on this After Visit Summary.  MyChart is used to connect with patients for Virtual Visits (Telemedicine).  Patients are able to view lab/test results, encounter notes, upcoming appointments, etc.  Non-urgent messages can be sent to your provider as well.   To learn more about what you can do with MyChart, go to [email or web address].    Your next appointment:   1 year(s)  The format for your next appointment:   In Person  Provider:   Peter Nishan, MD   Other Instructions Thank you for choosing [facility]!    

## 2021-01-10 DIAGNOSIS — H01001 Unspecified blepharitis right upper eyelid: Secondary | ICD-10-CM | POA: Diagnosis not present

## 2021-01-10 DIAGNOSIS — H2513 Age-related nuclear cataract, bilateral: Secondary | ICD-10-CM | POA: Diagnosis not present

## 2021-01-11 ENCOUNTER — Other Ambulatory Visit (INDEPENDENT_AMBULATORY_CARE_PROVIDER_SITE_OTHER): Payer: Self-pay

## 2021-01-11 DIAGNOSIS — K2270 Barrett's esophagus without dysplasia: Secondary | ICD-10-CM

## 2021-01-11 DIAGNOSIS — K219 Gastro-esophageal reflux disease without esophagitis: Secondary | ICD-10-CM

## 2021-01-11 MED ORDER — FAMOTIDINE 40 MG PO TABS: 20.0000 mg | ORAL_TABLET | Freq: Every day | ORAL | 3 refills | Status: DC

## 2021-01-13 DIAGNOSIS — Z79890 Hormone replacement therapy (postmenopausal): Secondary | ICD-10-CM | POA: Diagnosis not present

## 2021-01-13 DIAGNOSIS — E039 Hypothyroidism, unspecified: Secondary | ICD-10-CM | POA: Diagnosis not present

## 2021-01-13 DIAGNOSIS — Z6837 Body mass index (BMI) 37.0-37.9, adult: Secondary | ICD-10-CM | POA: Diagnosis not present

## 2021-01-13 DIAGNOSIS — E7212 Methylenetetrahydrofolate reductase deficiency: Secondary | ICD-10-CM | POA: Diagnosis not present

## 2021-01-13 DIAGNOSIS — D692 Other nonthrombocytopenic purpura: Secondary | ICD-10-CM | POA: Diagnosis not present

## 2021-01-13 DIAGNOSIS — G43909 Migraine, unspecified, not intractable, without status migrainosus: Secondary | ICD-10-CM | POA: Diagnosis not present

## 2021-01-13 DIAGNOSIS — J329 Chronic sinusitis, unspecified: Secondary | ICD-10-CM | POA: Diagnosis not present

## 2021-01-13 DIAGNOSIS — K219 Gastro-esophageal reflux disease without esophagitis: Secondary | ICD-10-CM | POA: Diagnosis not present

## 2021-01-13 DIAGNOSIS — M4726 Other spondylosis with radiculopathy, lumbar region: Secondary | ICD-10-CM | POA: Diagnosis not present

## 2021-01-13 DIAGNOSIS — Z7901 Long term (current) use of anticoagulants: Secondary | ICD-10-CM | POA: Diagnosis not present

## 2021-01-13 DIAGNOSIS — Q2112 Patent foramen ovale: Secondary | ICD-10-CM | POA: Diagnosis not present

## 2021-01-24 ENCOUNTER — Other Ambulatory Visit: Payer: Self-pay | Admitting: Podiatry

## 2021-01-24 MED ORDER — OXYCODONE-ACETAMINOPHEN 5-325 MG PO TABS: 1.0000 | ORAL_TABLET | ORAL | 0 refills | Status: DC | PRN

## 2021-02-03 DIAGNOSIS — Z0001 Encounter for general adult medical examination with abnormal findings: Secondary | ICD-10-CM | POA: Diagnosis not present

## 2021-02-03 DIAGNOSIS — E6609 Other obesity due to excess calories: Secondary | ICD-10-CM | POA: Diagnosis not present

## 2021-02-03 DIAGNOSIS — E559 Vitamin D deficiency, unspecified: Secondary | ICD-10-CM | POA: Diagnosis not present

## 2021-02-03 DIAGNOSIS — E039 Hypothyroidism, unspecified: Secondary | ICD-10-CM | POA: Diagnosis not present

## 2021-02-03 DIAGNOSIS — Z6835 Body mass index (BMI) 35.0-35.9, adult: Secondary | ICD-10-CM | POA: Diagnosis not present

## 2021-02-03 DIAGNOSIS — G4720 Circadian rhythm sleep disorder, unspecified type: Secondary | ICD-10-CM | POA: Diagnosis not present

## 2021-02-03 DIAGNOSIS — F331 Major depressive disorder, recurrent, moderate: Secondary | ICD-10-CM | POA: Diagnosis not present

## 2021-02-03 DIAGNOSIS — Z6837 Body mass index (BMI) 37.0-37.9, adult: Secondary | ICD-10-CM | POA: Diagnosis not present

## 2021-02-03 DIAGNOSIS — E119 Type 2 diabetes mellitus without complications: Secondary | ICD-10-CM | POA: Diagnosis not present

## 2021-02-03 DIAGNOSIS — B372 Candidiasis of skin and nail: Secondary | ICD-10-CM | POA: Diagnosis not present

## 2021-02-03 DIAGNOSIS — G43909 Migraine, unspecified, not intractable, without status migrainosus: Secondary | ICD-10-CM | POA: Diagnosis not present

## 2021-02-03 DIAGNOSIS — Z1331 Encounter for screening for depression: Secondary | ICD-10-CM | POA: Diagnosis not present

## 2021-02-05 ENCOUNTER — Other Ambulatory Visit (INDEPENDENT_AMBULATORY_CARE_PROVIDER_SITE_OTHER): Payer: Self-pay | Admitting: Internal Medicine

## 2021-02-08 DIAGNOSIS — G4733 Obstructive sleep apnea (adult) (pediatric): Secondary | ICD-10-CM | POA: Diagnosis not present

## 2021-02-09 DIAGNOSIS — N7689 Other specified inflammation of vagina and vulva: Secondary | ICD-10-CM | POA: Diagnosis not present

## 2021-02-09 DIAGNOSIS — A58 Granuloma inguinale: Secondary | ICD-10-CM | POA: Diagnosis not present

## 2021-02-09 DIAGNOSIS — N952 Postmenopausal atrophic vaginitis: Secondary | ICD-10-CM | POA: Diagnosis not present

## 2021-02-09 DIAGNOSIS — N941 Unspecified dyspareunia: Secondary | ICD-10-CM | POA: Diagnosis not present

## 2021-02-17 ENCOUNTER — Other Ambulatory Visit: Payer: Self-pay | Admitting: Podiatry

## 2021-02-17 MED ORDER — OXYCODONE-ACETAMINOPHEN 5-325 MG PO TABS: 1.0000 | ORAL_TABLET | ORAL | 0 refills | Status: DC | PRN

## 2021-02-24 DIAGNOSIS — E6609 Other obesity due to excess calories: Secondary | ICD-10-CM | POA: Diagnosis not present

## 2021-02-24 DIAGNOSIS — F1120 Opioid dependence, uncomplicated: Secondary | ICD-10-CM | POA: Diagnosis not present

## 2021-02-24 DIAGNOSIS — F331 Major depressive disorder, recurrent, moderate: Secondary | ICD-10-CM | POA: Diagnosis not present

## 2021-02-24 DIAGNOSIS — B372 Candidiasis of skin and nail: Secondary | ICD-10-CM | POA: Diagnosis not present

## 2021-02-24 DIAGNOSIS — Z6838 Body mass index (BMI) 38.0-38.9, adult: Secondary | ICD-10-CM | POA: Diagnosis not present

## 2021-02-24 DIAGNOSIS — E782 Mixed hyperlipidemia: Secondary | ICD-10-CM | POA: Diagnosis not present

## 2021-02-24 DIAGNOSIS — E119 Type 2 diabetes mellitus without complications: Secondary | ICD-10-CM | POA: Diagnosis not present

## 2021-02-24 DIAGNOSIS — Z0001 Encounter for general adult medical examination with abnormal findings: Secondary | ICD-10-CM | POA: Diagnosis not present

## 2021-02-24 DIAGNOSIS — E559 Vitamin D deficiency, unspecified: Secondary | ICD-10-CM | POA: Diagnosis not present

## 2021-02-24 DIAGNOSIS — E039 Hypothyroidism, unspecified: Secondary | ICD-10-CM | POA: Diagnosis not present

## 2021-02-28 DIAGNOSIS — M79671 Pain in right foot: Secondary | ICD-10-CM | POA: Diagnosis not present

## 2021-02-28 DIAGNOSIS — G894 Chronic pain syndrome: Secondary | ICD-10-CM | POA: Diagnosis not present

## 2021-02-28 DIAGNOSIS — M5416 Radiculopathy, lumbar region: Secondary | ICD-10-CM | POA: Diagnosis not present

## 2021-02-28 DIAGNOSIS — M47816 Spondylosis without myelopathy or radiculopathy, lumbar region: Secondary | ICD-10-CM | POA: Diagnosis not present

## 2021-02-28 DIAGNOSIS — Z79891 Long term (current) use of opiate analgesic: Secondary | ICD-10-CM | POA: Diagnosis not present

## 2021-03-08 ENCOUNTER — Other Ambulatory Visit: Payer: Self-pay | Admitting: *Deleted

## 2021-03-08 DIAGNOSIS — R413 Other amnesia: Secondary | ICD-10-CM

## 2021-03-08 MED ORDER — DONEPEZIL HCL 10 MG PO TABS: 10.0000 mg | ORAL_TABLET | Freq: Every day | ORAL | 3 refills | Status: DC

## 2021-03-20 ENCOUNTER — Telehealth: Payer: Self-pay

## 2021-03-20 NOTE — Telephone Encounter (Signed)
PA for emgality has been sent and received instant approval.   (Key: [id number])  This request has received a Favorable outcome.  Please note any additional information provided by [employer] at the bottom of this request.  Your request has been approved [id number] Emgality 120MG /ML [place] SOSY Quantity:1;

## 2021-03-31 DIAGNOSIS — Z6841 Body Mass Index (BMI) 40.0 and over, adult: Secondary | ICD-10-CM | POA: Diagnosis not present

## 2021-03-31 DIAGNOSIS — E039 Hypothyroidism, unspecified: Secondary | ICD-10-CM | POA: Diagnosis not present

## 2021-03-31 DIAGNOSIS — G4720 Circadian rhythm sleep disorder, unspecified type: Secondary | ICD-10-CM | POA: Diagnosis not present

## 2021-03-31 DIAGNOSIS — F331 Major depressive disorder, recurrent, moderate: Secondary | ICD-10-CM | POA: Diagnosis not present

## 2021-04-03 DIAGNOSIS — G894 Chronic pain syndrome: Secondary | ICD-10-CM | POA: Diagnosis not present

## 2021-04-03 DIAGNOSIS — M5416 Radiculopathy, lumbar region: Secondary | ICD-10-CM | POA: Diagnosis not present

## 2021-04-03 DIAGNOSIS — M47816 Spondylosis without myelopathy or radiculopathy, lumbar region: Secondary | ICD-10-CM | POA: Diagnosis not present

## 2021-04-03 DIAGNOSIS — M79671 Pain in right foot: Secondary | ICD-10-CM | POA: Diagnosis not present

## 2021-04-06 ENCOUNTER — Other Ambulatory Visit: Payer: Self-pay | Admitting: *Deleted

## 2021-04-06 MED ORDER — TOPIRAMATE 100 MG PO TABS: 100.0000 mg | ORAL_TABLET | Freq: Two times a day (BID) | ORAL | 0 refills | Status: DC

## 2021-04-13 DIAGNOSIS — G43909 Migraine, unspecified, not intractable, without status migrainosus: Secondary | ICD-10-CM | POA: Diagnosis not present

## 2021-04-13 DIAGNOSIS — K219 Gastro-esophageal reflux disease without esophagitis: Secondary | ICD-10-CM | POA: Diagnosis not present

## 2021-04-13 DIAGNOSIS — Z79890 Hormone replacement therapy (postmenopausal): Secondary | ICD-10-CM | POA: Diagnosis not present

## 2021-04-13 DIAGNOSIS — E039 Hypothyroidism, unspecified: Secondary | ICD-10-CM | POA: Diagnosis not present

## 2021-04-17 ENCOUNTER — Other Ambulatory Visit: Payer: Self-pay

## 2021-04-17 MED ORDER — EMGALITY 120 MG/ML ~~LOC~~ SOSY: 120.0000 mg | PREFILLED_SYRINGE | SUBCUTANEOUS | 4 refills | Status: DC

## 2021-04-19 DIAGNOSIS — G894 Chronic pain syndrome: Secondary | ICD-10-CM | POA: Diagnosis not present

## 2021-04-19 DIAGNOSIS — M79671 Pain in right foot: Secondary | ICD-10-CM | POA: Diagnosis not present

## 2021-04-19 DIAGNOSIS — M5416 Radiculopathy, lumbar region: Secondary | ICD-10-CM | POA: Diagnosis not present

## 2021-04-19 DIAGNOSIS — M47816 Spondylosis without myelopathy or radiculopathy, lumbar region: Secondary | ICD-10-CM | POA: Diagnosis not present

## 2021-04-21 DIAGNOSIS — Q2112 Patent foramen ovale: Secondary | ICD-10-CM | POA: Diagnosis not present

## 2021-04-21 DIAGNOSIS — Z6841 Body Mass Index (BMI) 40.0 and over, adult: Secondary | ICD-10-CM | POA: Diagnosis not present

## 2021-04-21 DIAGNOSIS — G4720 Circadian rhythm sleep disorder, unspecified type: Secondary | ICD-10-CM | POA: Diagnosis not present

## 2021-04-21 DIAGNOSIS — Z9884 Bariatric surgery status: Secondary | ICD-10-CM | POA: Diagnosis not present

## 2021-04-21 DIAGNOSIS — F902 Attention-deficit hyperactivity disorder, combined type: Secondary | ICD-10-CM | POA: Diagnosis not present

## 2021-04-21 DIAGNOSIS — M797 Fibromyalgia: Secondary | ICD-10-CM | POA: Diagnosis not present

## 2021-05-11 ENCOUNTER — Other Ambulatory Visit (INDEPENDENT_AMBULATORY_CARE_PROVIDER_SITE_OTHER): Payer: Self-pay | Admitting: Internal Medicine

## 2021-05-11 ENCOUNTER — Other Ambulatory Visit (INDEPENDENT_AMBULATORY_CARE_PROVIDER_SITE_OTHER): Payer: Self-pay | Admitting: *Deleted

## 2021-05-11 ENCOUNTER — Other Ambulatory Visit: Payer: Self-pay

## 2021-05-11 ENCOUNTER — Telehealth (INDEPENDENT_AMBULATORY_CARE_PROVIDER_SITE_OTHER): Payer: Self-pay | Admitting: *Deleted

## 2021-05-11 MED ORDER — TOPIRAMATE 100 MG PO TABS: 100.0000 mg | ORAL_TABLET | Freq: Two times a day (BID) | ORAL | 3 refills | Status: DC

## 2021-05-11 MED ORDER — OMEPRAZOLE 40 MG PO CPDR: DELAYED_RELEASE_CAPSULE | ORAL | 1 refills | Status: DC

## 2021-05-11 NOTE — Telephone Encounter (Signed)
Pt has appt in June with you and asked if she will need labs prior to visit. Last labs ordered by dr Laural Golden LFT on 03/02/20.  ?

## 2021-05-11 NOTE — Telephone Encounter (Signed)
Pt wants to make follow up to get her refills. Last seen over one year ago and needs refill on omeprazole.  ?[employer] ? ?Pt's number [phone] ?

## 2021-05-11 NOTE — Telephone Encounter (Signed)
Ok for 6 month of refills per dr Laural Golden. Pt notified refills sent to pharm.  ?

## 2021-05-15 NOTE — Telephone Encounter (Signed)
Patient aware of all.

## 2021-05-16 ENCOUNTER — Ambulatory Visit: Payer: PPO | Admitting: Psychiatry

## 2021-05-26 DIAGNOSIS — M47816 Spondylosis without myelopathy or radiculopathy, lumbar region: Secondary | ICD-10-CM | POA: Diagnosis not present

## 2021-05-26 DIAGNOSIS — G894 Chronic pain syndrome: Secondary | ICD-10-CM | POA: Diagnosis not present

## 2021-05-26 DIAGNOSIS — M5416 Radiculopathy, lumbar region: Secondary | ICD-10-CM | POA: Diagnosis not present

## 2021-05-26 DIAGNOSIS — M79671 Pain in right foot: Secondary | ICD-10-CM | POA: Diagnosis not present

## 2021-05-29 ENCOUNTER — Other Ambulatory Visit: Payer: Self-pay | Admitting: *Deleted

## 2021-05-29 MED ORDER — METAFOLBIC PLUS 6-2-600 MG PO TABS: 1.0000 | ORAL_TABLET | Freq: Every day | ORAL | 1 refills | Status: DC

## 2021-06-06 NOTE — Progress Notes (Unsigned)
Office Visit Note  Patient: Debbie Pineda             Date of Birth: 09-19-1960           MRN: [id number]             PCP: Debbie Samples, PA-C Referring: Debbie Pineda, [place]* Visit Date: 06/20/2021 Occupation: '@GUAROCC'$ @  Subjective:  Chronic pain   History of Present Illness: Debbie Pineda is a 61 y.o. female with history of fibromyalgia, osteoarthritis, and DDD.  Patient continues to experience intermittent myalgias and muscle tenderness due to fibromyalgia.  She has been experiencing more severe flares that has been lasting longer which she attributes to weather changes.  She continues to have chronic thoracic and lumbar spine pain.  She is under the care of Dr. Louanne Skye.  She will be scheduling a CT and myelogram of the thoracic and lumbar spine for further evaluation.  She denies any recent falls or injuries but states that she may require surgery in the future.  She continues to take zanaflex, percocet, gabapentin, and cymbalta as prescribed.  She states she is essentially bedridden due to the severity of pain in her back.    Activities of Daily Living:  Patient reports morning stiffness for all day. Patient Reports nocturnal pain.  Difficulty dressing/grooming: Reports Difficulty climbing stairs: Reports Difficulty getting out of chair: Reports Difficulty using hands for taps, buttons, cutlery, and/or writing: Reports  Review of Systems  Constitutional:  Positive for fatigue.  HENT:  Positive for mouth sores, mouth dryness and nose dryness.   Eyes:  Positive for pain, itching and dryness.  Respiratory:  Negative for shortness of breath and difficulty breathing.   Cardiovascular:  Negative for chest pain and palpitations.  Gastrointestinal:  Positive for constipation. Negative for blood in stool and diarrhea.  Endocrine: Negative for increased urination.  Genitourinary:  Positive for involuntary urination. Negative for difficulty urinating.  Musculoskeletal:   Positive for joint pain, joint pain, joint swelling, myalgias, morning stiffness, muscle tenderness and myalgias.  Skin:  Negative for color change, rash and redness.  Allergic/Immunologic: Negative for susceptible to infections.  Neurological:  Positive for numbness, headaches, memory loss and weakness. Negative for dizziness.  Hematological:  Positive for bruising/bleeding tendency.  Psychiatric/Behavioral:  Negative for confusion.    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   GERD (gastroesophageal reflux disease) 09/22/2019   SSBE (short-segment Barrett's esophagus) 09/22/2019   Flatulence 09/22/2019   Migraine 11/11/2018   Abdominal pain, epigastric 10/14/2018   LUQ pain 10/14/2018   Attention deficit hyperactivity disorder (ADHD) 01/12/2018   Insomnia 01/12/2018   Elevated liver enzymes 09/10/2017   History of diabetes mellitus 06/06/2017   Primary osteoarthritis of right hand 06/06/2017   Transaminasemia    TIA (transient ischemic attack) 05/01/2017   Dysphasia 05/01/2017   Chronic pain syndrome 05/01/2017   Confusion    Presence of right artificial knee joint 09/17/2016   H/O total knee replacement, right 08/15/2016   Presence of retained hardware    Memory disorder 08/14/2016   Cerebrovascular disease 08/14/2016   Unilateral primary osteoarthritis, right knee 05/29/2016   Primary osteoarthritis of both feet 05/29/2016   Trochanteric bursitis of both hips 05/25/2016   Neck pain 05/25/2016   Urinary urgency 04/19/2016   Chronic low back pain 01/11/2016  Depression 11/29/2015   Total knee replacement status 03/23/2015   Heart palpitations 12/14/2013   Generalized osteoarthritis of multiple sites 11/04/2013   Cerebral infarction ([place]) 10/30/2011   PFO (patent foramen ovale)  10/30/2011   Bradycardia 10/30/2011   Chest pain 10/30/2011   Hypothyroid    Thrombophlebitis    Sleep apnea    Fibromyalgia    Status post bariatric surgery 12/07/2010   Vein disorder 11/29/2010   S/P total hysterectomy and bilateral salpingo-oophorectomy 11/29/2010   S/P exploratory laparotomy 11/29/2010   S/P cholecystectomy 11/29/2010   S/P ACL surgery 11/29/2010   Morbid obesity ([place]) 11/10/2010    Past Medical History:  Diagnosis Date   ADD (attention deficit disorder)    Arthritis    "all over"   Cerebral infarction ([place]) 10/30/2011   Cerebrovascular disease 08/14/2016   Chronic back pain    "all over"   Chronic low back pain 44/01/270   Complication of anesthesia    tends to have hypotension when NPO and post-anesthesia   Constipation    takes stool softener daily   Degenerative disk disease    Degenerative joint disease    DVT (deep venous thrombosis) ([place]) 2014   RLE   Family history of adverse reaction to anesthesia    a family member woke up during surgery; "think it was my mom"   Fibromyalgia    Generalized osteoarthritis of multiple sites 11/04/2013   GERD (gastroesophageal reflux disease)    Heart palpitations 12/14/2013   resolved   History of blood clots    superficial   Hypoglycemia    Hypothyroid    takes Synthroid daily   Incomplete emptying of bladder    Insomnia    takes Trazodone nightly   Iron deficiency anemia    takes Ferrous Sulfate daily   Joint pain    Joint swelling    knees and ankles   Memory disorder 08/14/2016   Morbid obesity ([place])    Nausea    takes Zofran as needed.Seeing GI doc   Neck pain 05/25/2016   OSA on CPAP    tested more than 5 yrs ago.     Osteoarthritis    Osteopenia    in feet   PFO (patent foramen ovale)    no murmur   Pre-diabetes    Primary osteoarthritis of both feet 05/29/2016   Right bunionectomy August 2017 by Dr. Sharol Given   Scoliosis    Skin abnormality 02/04/2020   raised area on lip   Sleep apnea     mild osa per pt   Stroke [facility]) "several"last 2014   right foot weakness; memory issues, black spot right visual field since" (03/23/2015)   Thrombophlebitis    Trochanteric bursitis of both hips 05/25/2016   Unilateral primary osteoarthritis, right knee 05/29/2016   Urinary urgency 04/19/2016   Vein disorder 11/29/2010   varicose veins both legs   Wears glasses     Family History  Problem Relation Age of Onset   Cancer Mother        skin and breast   Myasthenia gravis Mother    Breast cancer Mother    Heart disease Father    Cancer Father    Parkinson's disease Father    Heart disease Sister    Heart attack Sister    Heart disease Brother    Cancer Brother    Diabetes Brother    Stroke Brother    Cancer Maternal Grandfather    Hypothyroidism Daughter  Hypertension Other    Colon cancer Neg Hx    Past Surgical History:  Procedure Laterality Date   BIOPSY  11/14/2018   Procedure: BIOPSY;  Surgeon: Rogene Houston, MD;  [facility];  Service: Endoscopy;;  esophagusgastric   BONE EXCISION Right 08/29/2017   Procedure: right trapezium excision;  Surgeon: Daryll Brod, MD;  Location: [place];  Service: Orthopedics;  Laterality: Right;   BUNIONECTOMY Right 08/2015   CARDIAC CATHETERIZATION     2008.  "it was fine" (not sure why she had it done, and doesn't know where)   CARPOMETACARPEL SUSPENSION PLASTY Right 08/29/2017   Procedure: SUSPENSION PLASTY RIGHT THUMB;  Surgeon: Daryll Brod, MD;  Location: [place];  Service: Orthopedics;  Laterality: Right;   COLONOSCOPY N/A 03/25/2013   Procedure: COLONOSCOPY;  Surgeon: Rogene Houston, MD;  [facility];  Service: Endoscopy;  Laterality: N/A;  930   ERCP  06/02/2020   ESOPHAGOGASTRODUODENOSCOPY     ESOPHAGOGASTRODUODENOSCOPY (EGD) WITH PROPOFOL N/A 11/14/2018   Procedure: ESOPHAGOGASTRODUODENOSCOPY (EGD) WITH PROPOFOL;  Surgeon: Rogene Houston, MD;  [facility];   Service: Endoscopy;  Laterality: N/A;  1:55pm-office moved to 11:00am/pt notified to arrive at 9:30am per [place]     "took fallopian tubes out"   [place] Right 02/12/2020   Procedure: HALLUX INTERPHANGEAL JOINT  FUSION;  Surgeon: Felipa Furnace, DPM;  Location: [place];  Service: Podiatry;  Laterality: Right;   HAMMER TOE SURGERY Right 02/12/2020   Procedure: SECOND AND THIRD HAMMER TOE CORRECTION; CAPSULOTOMY SECOND INTERPHALANGEAL JOINT;  Surgeon: Felipa Furnace, DPM;  Location: [place];  Service: Podiatry;  Laterality: Right;   JOINT REPLACEMENT     bil knee    KNEE ARTHROSCOPY Left    KNEE ARTHROSCOPY W/ ACL RECONSTRUCTION Right yrs ago   "added pins"   LAPAROSCOPIC CHOLECYSTECTOMY  ~ 2001   ROUX-EN-Y GASTRIC BYPASS  11/20/2010   [place]   SPINAL CORD STIMULATOR INSERTION N/A 04/18/2017   Procedure: LUMBAR SPINAL CORD STIMULATOR INSERTION;  Surgeon: Clydell Hakim, MD;  Location: [place];  Service: Neurosurgery;  Laterality: N/A;  LUMBAR SPINAL CORD STIMULATOR INSERTION   stomach stent  04/28/2020   TENDON TRANSFER Right 08/29/2017   Procedure: right abductor pollicis longus transfer;  Surgeon: Daryll Brod, MD;  Location: [place];  Service: Orthopedics;  Laterality: Right;   TOTAL KNEE ARTHROPLASTY Left 03/23/2015   Procedure: TOTAL KNEE ARTHROPLASTY;  Surgeon: Newt Minion, MD;  Location: [place];  Service: Orthopedics;  Laterality: Left;   TOTAL KNEE ARTHROPLASTY Right 08/15/2016   Procedure: RIGHT TOTAL KNEE ARTHROPLASTY, REMOVAL ACL SCREWS;  Surgeon: Newt Minion, MD;  Location: [place];  Service: Orthopedics;  Laterality: Right;   TOTAL KNEE ARTHROPLASTY WITH HARDWARE REMOVAL Right    VAGINAL HYSTERECTOMY     tah/bso   VARICOSE VEIN SURGERY Right X 2   WEIL OSTEOTOMY Right 02/12/2020   Procedure: DOUBLE L OSTEOTOMY;  Surgeon: Felipa Furnace, DPM;  Location: [place];  Service: Podiatry;   Laterality: Right;   Social History   Social History Narrative   Lives with husband   Caffeine use: No soda   Mainly water, drinks decaf tea   Right handed   Immunization History  Administered Date(s) Administered   Influenza Split 11/01/2011, 08/23/2014   Influenza-Unspecified 08/23/2014, 11/22/2017   Moderna Sars-Covid-2 Vaccination 03/16/2019, 04/13/2019  Pneumococcal Polysaccharide-23 04/26/2017   Zoster Recombinat (Shingrix) 07/02/2017, 01/08/2018     Objective: Vital Signs: BP 109/77 (BP Location: Left Arm, Patient Position: Sitting, Cuff Size: Large)   Pulse 79   Ht '5\' 5"'$  (1.651 m)   Wt 247 lb 9.6 oz (112.3 kg)   BMI 41.20 kg/m    Physical Exam Vitals and nursing note reviewed.  Constitutional:      Appearance: She is well-developed.  HENT:     Head: Normocephalic and atraumatic.  Eyes:     Conjunctiva/sclera: Conjunctivae normal.  Cardiovascular:     Rate and Rhythm: Normal rate and regular rhythm.     Heart sounds: Normal heart sounds.  Pulmonary:     Effort: Pulmonary effort is normal.     Breath sounds: Normal breath sounds.  Abdominal:     General: Bowel sounds are normal.     Palpations: Abdomen is soft.  Musculoskeletal:     Cervical back: Normal range of motion.  Skin:    General: Skin is warm and dry.     Capillary Refill: Capillary refill takes less than 2 seconds.  Neurological:     Mental Status: She is alert and oriented to person, place, and time.  Psychiatric:        Behavior: Behavior normal.     Musculoskeletal Exam: Generalized hyperalgesia and positive tender points.  C-spine has good ROM.  Trapezius muscle tension and tenderness bilaterally.  Painful ROM of thoracic and lumbar spine.  Shoulder joints, elbow joints, wrist joints, MCPs, PIPs, and DIPs good ROM with no synovitis.  PIP and DIP thickening consistent with OA of both hands.  Knee joints have good ROM with no warmth or effusion. Ankle joints have good ROM with no tenderness  or joint swelling.   CDAI Exam: CDAI Score: -- Patient Global: --; Provider Global: -- Swollen: --; Tender: -- Joint Exam 06/20/2021   No joint exam has been documented for this visit   There is currently no information documented on the homunculus. Go to the Rheumatology activity and complete the homunculus joint exam.  Investigation: No additional findings.  Imaging: No results found.  Recent Labs: Lab Results  Component Value Date   WBC 8.5 07/04/2020   HGB 13.7 07/04/2020   PLT 202 07/04/2020   NA 140 07/04/2020   K 4.5 07/04/2020   CL 109 07/04/2020   CO2 26 07/04/2020   GLUCOSE 79 07/04/2020   BUN 13 07/04/2020   CREATININE 0.96 07/04/2020   BILITOT 0.4 03/02/2020   ALKPHOS 83 03/09/2019   AST 29 03/02/2020   ALT 83 (H) 03/02/2020   PROT 6.1 03/02/2020   ALBUMIN 3.9 03/09/2019   CALCIUM 9.2 07/04/2020   GFRAA 81 10/14/2018    Speciality Comments: No specialty comments available.  Procedures:  Trigger Point Inj  Date/Time: 06/20/2021 2:36 PM Performed by: Ofilia Neas, PA-C Authorized by: Ofilia Neas, PA-C   Consent Given by:  Patient Site marked: the procedure site was marked   Timeout: prior to procedure the correct patient, procedure, and site was verified   Indications:  Pain Total # of Trigger Points:  2 Location: neck   Needle Size:  27 G Approach:  Dorsal Medications #1:  0.5 mL lidocaine 1 %; 10 mg triamcinolone acetonide 40 MG/ML Medications #2:  0.5 mL lidocaine 1 %; 10 mg triamcinolone acetonide 40 MG/ML Patient tolerance:  Patient tolerated the procedure well with no immediate complications Allergies: Lyrica [pregabalin], Belsomra [suvorexant], Morphine and related, Sulfamethoxazole-trimethoprim,  and Tape    Trapezius injections  OA supp Mediterranen diet  Assessment / Plan:     Visit Diagnoses: Fibromyalgia: She has generalized hyperalgesia and positive tender points on exam.  She has been experiencing more severe fibromyalgia  flares which have been lasting longer.  Most of her flares are exacerbated by weather changes.  She has been essentially bedridden due to the severity of pain in her thoracic and lumbar spine.  She remains on Percocet, tizanidine, Cymbalta, baclofen, and gabapentin as prescribed. She presents today with trapezius muscle tension and tenderness bilaterally.  She has been experiencing muscle spasms intermittently.  She requested trigger point injections today.  She tolerated procedure well.  Procedure note was completed above.  Discussed the importance of regular exercise and good sleep hygiene.  She will follow-up in the office in 6 months or sooner if needed.  Trapezius muscle spasm -She presents today with trapezius muscle tension and tenderness bilaterally.  She notes muscle spasms intermittently.  She takes baclofen or tizanidine as needed for muscle spasms.  She requested trigger point injections today.  She tolerated procedure well.  Procedure notes were completed above.  Aftercare was discussed.  Plan: Trigger Point Inj  Primary osteoarthritis of both hands: She has PIP and DIP thickening consistent with osteoarthritis of both hands.  She has been experiencing intermittent pain and stiffness in both hands due to underlying osteoarthritis.  No signs of inflammatory arthritis were noted on examination today.  Discussed the importance of joint protection and muscle strengthening.  She was given a handout of hand exercises to perform.  Her treatment options are limited due to being unable to take NSAIDs.  She remains on Percocet for chronic pain relief.  I discussed the use of natural anti-inflammatories.  She was encouraged to avoid turmeric and ginger due to the blood thinning properties since she is on Xarelto. Discussed avoiding pro inflammatory foods such as red meat and sugar.  She would benefit from following a Mediterranean style diet.  An informational handout about a Mediterranean diet was provided  to the patient to further review.  Trigger finger, right ring finger - Resolved   Trochanteric bursitis of both hips: She has ongoing discomfort due to trochanter bursitis of both hips.  Discussed the importance of performing stretching exercises daily.  History of total bilateral knee replacement: Doing well.  No warmth or effusion noted.  Primary osteoarthritis of both feet - Followed by Dr. Posey Pronto.    DDD (degenerative disc disease), lumbar - Followed by Dr. Louanne Skye.  Chronic pain.  Patient has been more sedentary recently due to severity of pain and stiffness in her back.  Office visit note from 06/09/2021 was reviewed today in the office.  Myelogram and CT of the thoracic and lumbar spine were ordered for further evaluation.  DDD (degenerative disc disease), thoracic: She will be scheduling a CT and myelogram ordered by Dr. Louanne Skye for the thoracic and lumbar spine.  Other medical conditions are listed as follows:  History of thrombophlebitis  History of diabetes mellitus  History of sleep apnea  History of gastric bypass  PFO (patent foramen ovale)  History of cerebral infarction  Orders: Orders Placed This Encounter  Procedures   Trigger Point Inj   No orders of the defined types were placed in this encounter.     Follow-Up Instructions: Return in about 6 months (around 12/21/2021) for Fibromyalgia, Osteoarthritis, DDD.   Ofilia Neas, PA-C  Note - This record has been created  using Editor, commissioning.  Chart creation errors have been sought, but may not always  have been located. Such creation errors do not reflect on  the standard of medical care.

## 2021-06-07 IMAGING — US US ABDOMEN COMPLETE
1 series · 13 of 25 positions shown · non-contrast
Comparison: Ultrasound abdomen 03/09/2019, CT abdomen pelvis
10/27/2018

CLINICAL DATA: History of dilated bile duct. Elevated
transaminases.

EXAM:
ABDOMEN ULTRASOUND COMPLETE

[Series 1: us abdomen complete · 13 of 100 slices shown]
[im 1/100]
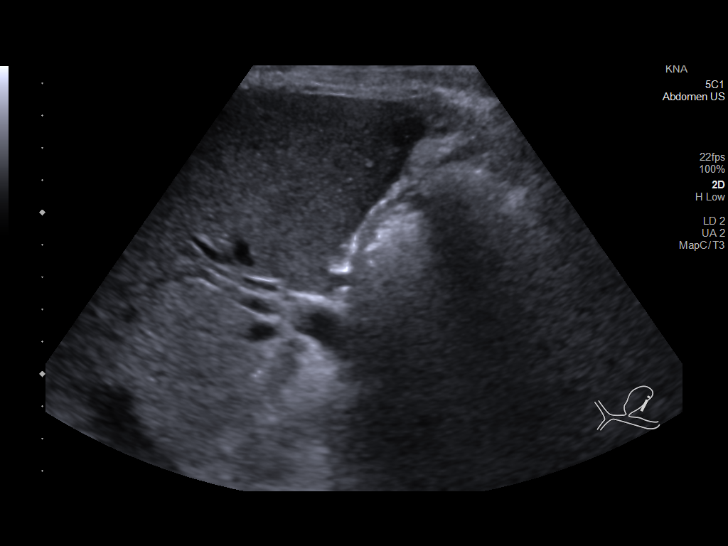
[im 9/100]
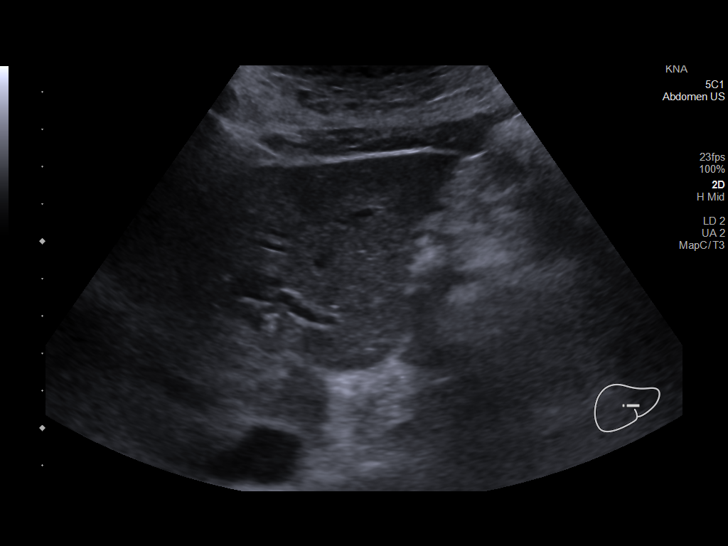
[im 17/100]
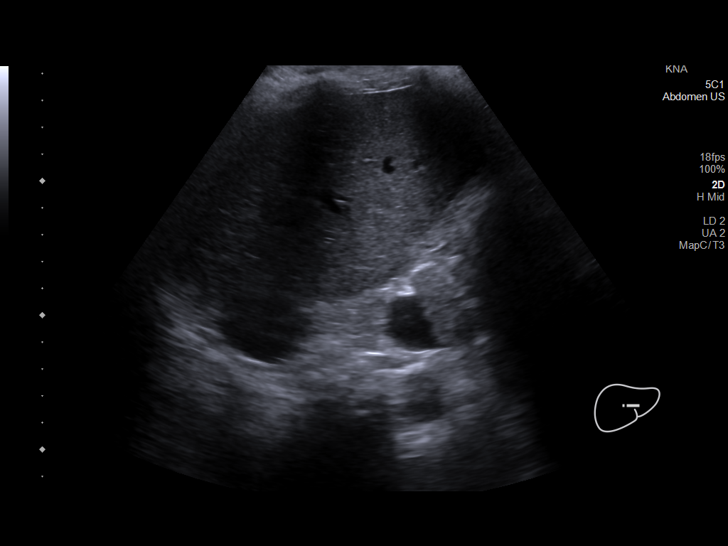
[im 25/100]
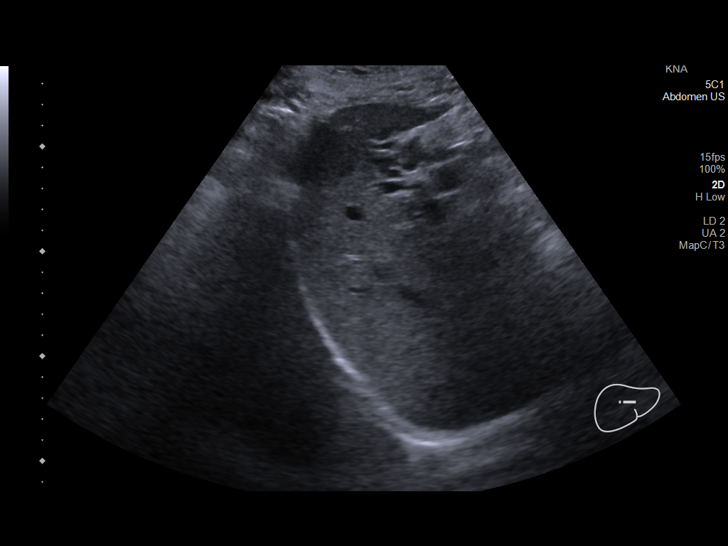
[im 34/100]
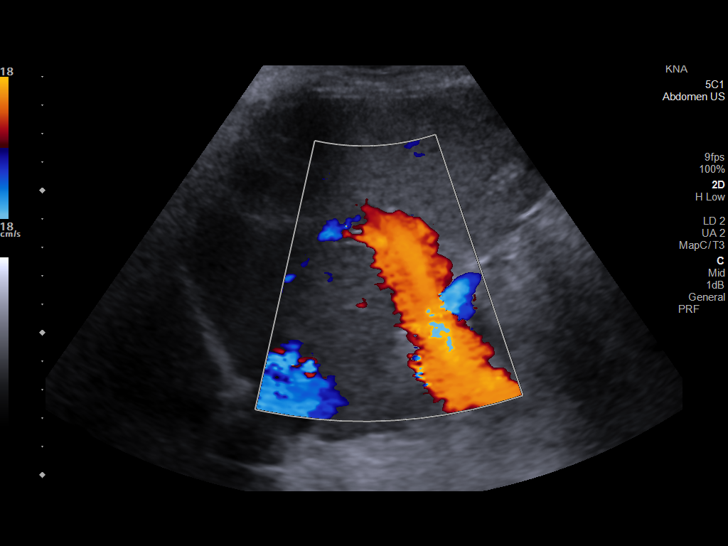
[im 42/100]
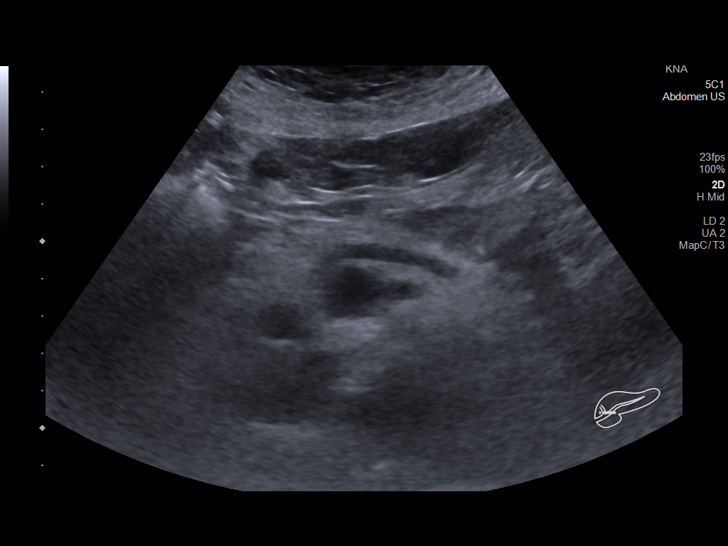
[im 50/100]
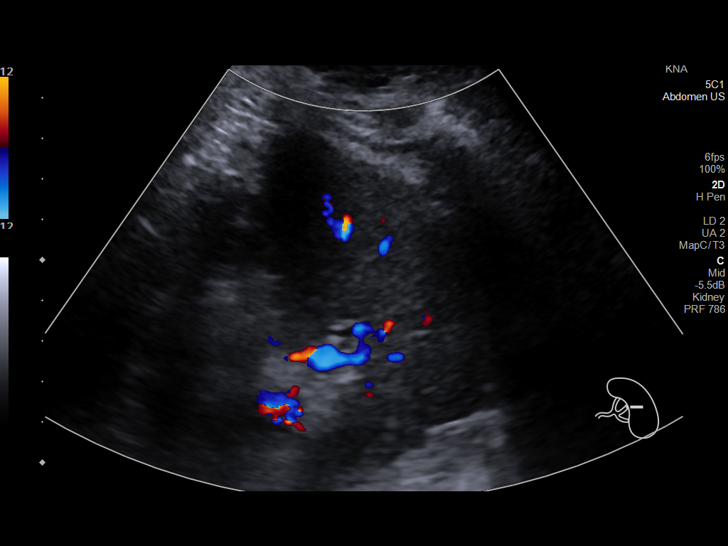
[im 58/100]
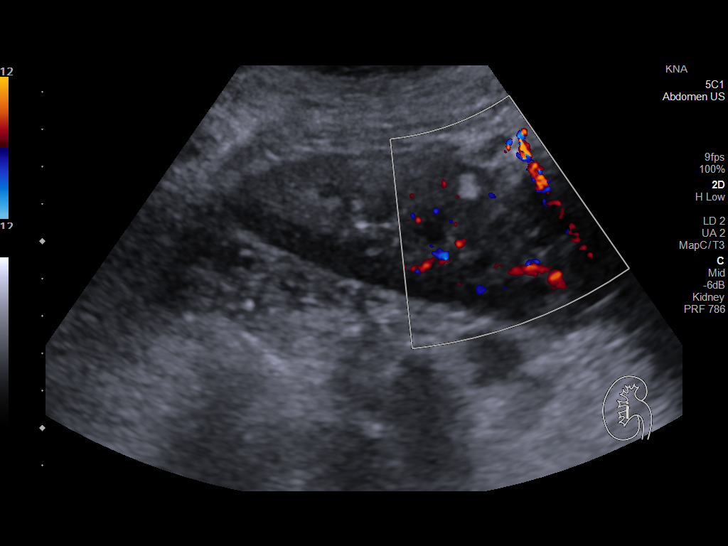
[im 67/100]
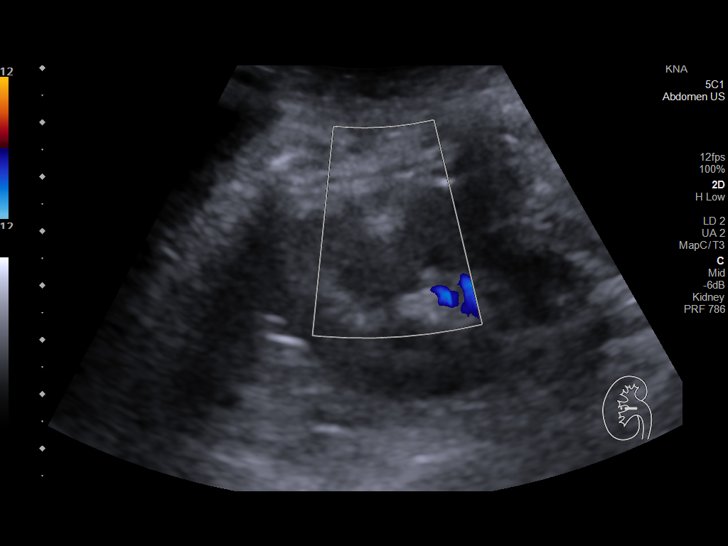
[im 75/100]
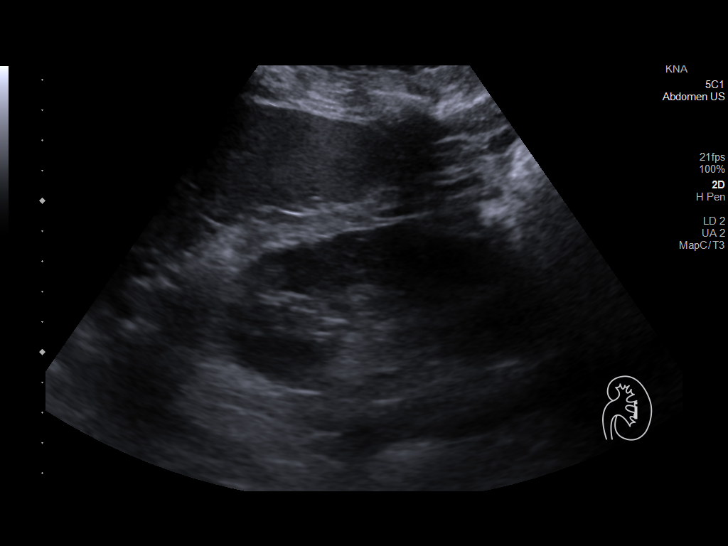
[im 83/100]
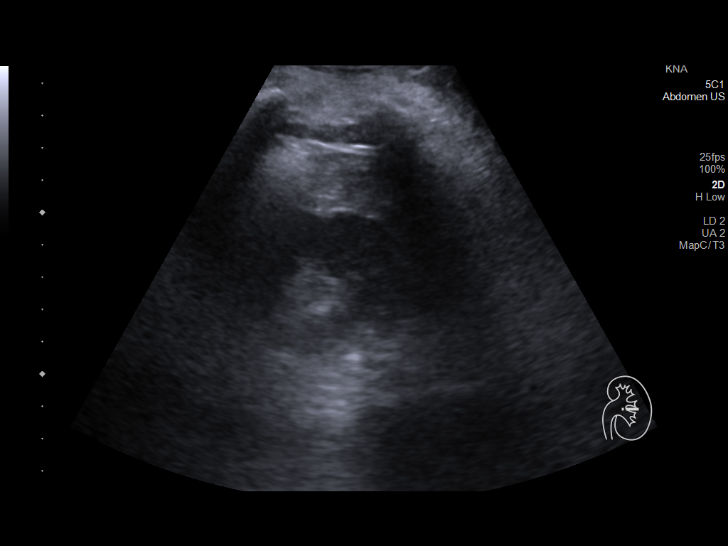
[im 91/100]
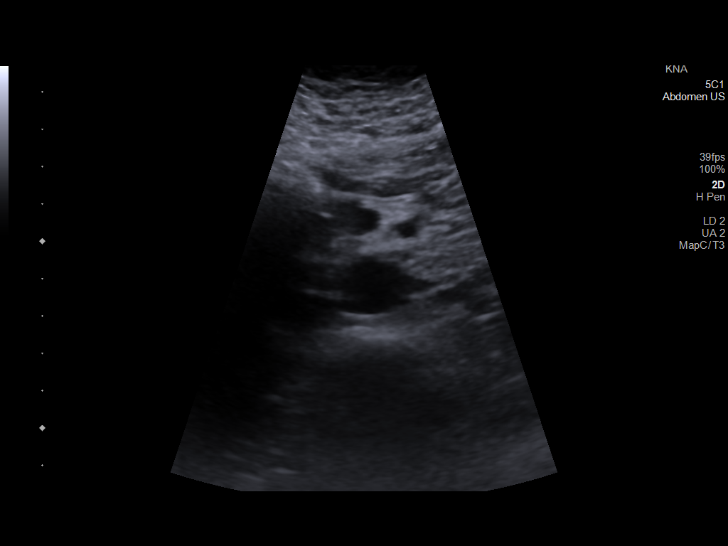
[im 100/100]
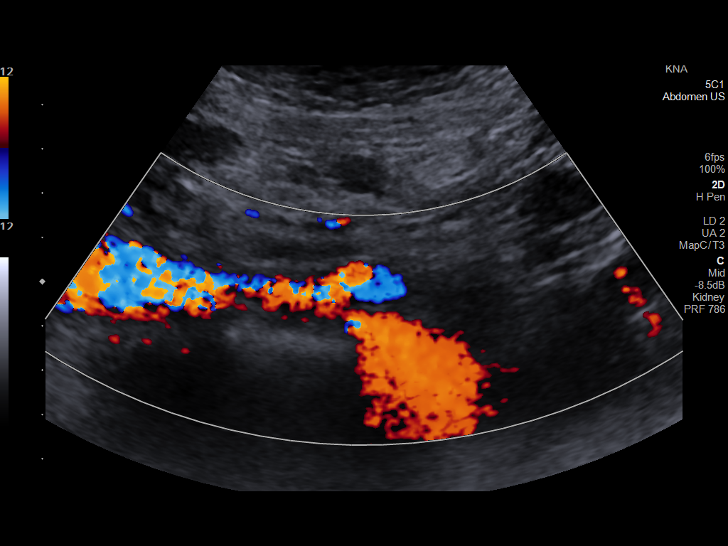

[13 of 25 positions shown; findings below may reference images not displayed]

FINDINGS: Gallbladder: Status post cholecystectomy. No sonographic Murphy sign
was noted by the ultrasound technician.

Common bile duct: Diameter: 12 mm.  Stable in size.

Liver: No focal lesion identified. Increased in parenchymal
echogenicity. Portal vein is patent on color Doppler imaging with
normal direction of blood flow towards the liver.

IVC: No abnormality visualized.

Pancreas: Interval development of borderline enlarged proximal main
pancreatic duct. Visualized portion unremarkable.

Spleen: Size and appearance within normal limits.

Right Kidney: Length: 11.4 cm. Echogenicity within normal limits.
There is a hyperechoic solid 0.8 cm right inferior pole renal mass
that corresponds with a known right angiomyolipoma. No
hydronephrosis visualized.

Left Kidney: Length: 11.7 cm. Echogenicity within normal limits. No
mass or hydronephrosis visualized.

Abdominal aorta: No aneurysm visualized.

Other findings: None.
IMPRESSION: Interval development of mildly enlarged proximal main pancreatic
duct with stable enlarged common bile duct suggestive of double duct
sign. Consider further cross-sectional imaging with intravenous
contrast to exclude an ampullary or proximal pancreatic lesion.

These results will be called to the ordering clinician or
representative by the Radiologist Assistant, and communication
documented in the PACS or [REDACTED].

## 2021-06-09 ENCOUNTER — Ambulatory Visit (INDEPENDENT_AMBULATORY_CARE_PROVIDER_SITE_OTHER): Payer: PPO | Admitting: Specialist

## 2021-06-09 ENCOUNTER — Encounter: Payer: Self-pay | Admitting: Specialist

## 2021-06-09 ENCOUNTER — Ambulatory Visit: Payer: Self-pay

## 2021-06-09 VITALS — BP 110/76 | HR 66 | Ht 65.0 in | Wt 225.0 lb

## 2021-06-09 DIAGNOSIS — M4156 Other secondary scoliosis, lumbar region: Secondary | ICD-10-CM | POA: Diagnosis not present

## 2021-06-09 DIAGNOSIS — M5136 Other intervertebral disc degeneration, lumbar region: Secondary | ICD-10-CM | POA: Diagnosis not present

## 2021-06-09 DIAGNOSIS — M4726 Other spondylosis with radiculopathy, lumbar region: Secondary | ICD-10-CM

## 2021-06-09 NOTE — Progress Notes (Addendum)
Office Visit Note   Patient: Debbie Pineda           Date of Birth: 03-09-1960           MRN: [id number] Visit Date: 06/09/2021              Requested by: Jake Samples, PA-C [address] [place],  [place] [address] PCP: Jake Samples, PA-C   Assessment & Plan: Visit Diagnoses:  1. Lumbar degenerative disc disease   2. Other secondary scoliosis, lumbar region   3. Other spondylosis with radiculopathy, lumbar region     Plan: Avoid bending, stooping and avoid lifting weights greater than 10 lbs. Avoid prolong standing and walking. Avoid frequent bending and stooping  No lifting greater than 10 lbs. May use ice or moist heat for pain. Weight loss is of benefit. Handicap license is approved. Myelogram and post myelogram CT scan of the thoracic and lumbar spine.   Follow-Up Instructions: No follow-ups on file.   Orders:  Orders Placed This Encounter  Procedures  . XR Lumbar Spine 2-3 Views   No orders of the defined types were placed in this encounter.     Procedures: No procedures performed   Clinical Data: No additional findings.   Subjective: Chief Complaint  Patient presents with  . Lower Back - Pain    61 year old female with increasing back pain with radiation into the thighs. She has a lumbar scoliosis and upper lumbar spinal stenosis changes. 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 Pain is worse with standing and walking. No bowel or bladder difficulty. Pain at night and with first standing and walking, has to lean on a grocery cart when shopping.  Review of Systems  Constitutional: Negative.   HENT: Negative.    Eyes: Negative.   Respiratory: Negative.    Cardiovascular: Negative.   Gastrointestinal: Negative.   Endocrine: Negative.   Genitourinary: Negative.   Musculoskeletal: Negative.   Skin: Negative.   Allergic/Immunologic: Negative.   Neurological: Negative.    Hematological: Negative.   Psychiatric/Behavioral: Negative.      Objective: Vital Signs: BP 110/76 (BP Location: Left Arm, Patient Position: Sitting)   Pulse 66   Ht '5\' 5"'$  (1.651 m)   Wt 225 lb (102.1 kg)   BMI 37.44 kg/m   Physical Exam Constitutional:      Appearance: She is well-developed.  HENT:     Head: Normocephalic and atraumatic.  Eyes:     Pupils: Pupils are equal, round, and reactive to light.  Pulmonary:     Effort: Pulmonary effort is normal.     Breath sounds: Normal breath sounds.  Abdominal:     General: Bowel sounds are normal.     Palpations: Abdomen is soft.  Musculoskeletal:     Cervical back: Normal range of motion and neck supple.     Lumbar back: Negative right straight leg raise test and negative left straight leg raise test.  Skin:    General: Skin is warm and dry.  Neurological:     Mental Status: She is alert and oriented to person, place, and time.  Psychiatric:        Behavior: Behavior normal.        Thought Content: Thought content normal.        Judgment: Judgment normal.   Back Exam   Tenderness  The patient is experiencing tenderness in the lumbar.  Range of Motion  Extension:  abnormal  Flexion:  abnormal  Lateral bend right:  abnormal  Rotation right:  abnormal   Tests  Straight leg raise right: negative Straight leg raise left: negative  Other  Heel walk: abnormal Gait: antalgic  Erythema: no back redness Scars: present    Specialty Comments:  No specialty comments available.  Imaging: No results found.   PMFS History: Patient Active Problem List   Diagnosis Date Noted  . Encounter for removal of internal fixation device 10/21/2020  . LFTs abnormal 03/02/2020  . 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 GERD (gastroesophageal reflux disease) 09/22/2019  . SSBE (short-segment Barrett's esophagus) 09/22/2019  . Flatulence 09/22/2019  . Migraine 11/11/2018  . Abdominal  pain, epigastric 10/14/2018  . LUQ pain 10/14/2018  . Attention deficit hyperactivity disorder (ADHD) 01/12/2018  . Insomnia 01/12/2018  . Elevated liver enzymes 09/10/2017  . History of diabetes mellitus 06/06/2017  . Primary osteoarthritis of right hand 06/06/2017  . Transaminasemia   . TIA (transient ischemic attack) 05/01/2017  . Dysphasia 05/01/2017  . Chronic pain syndrome 05/01/2017  . Confusion   . Presence of right artificial knee joint 09/17/2016  . H/O total knee replacement, right 08/15/2016  . Presence of retained hardware   . Memory disorder 08/14/2016  . Cerebrovascular disease 08/14/2016  . Unilateral primary osteoarthritis, right knee 05/29/2016  . Primary osteoarthritis of both feet 05/29/2016  . Trochanteric bursitis of both hips 05/25/2016  . Neck pain 05/25/2016  . Urinary urgency 04/19/2016  . Chronic low back pain 01/11/2016  . Depression 11/29/2015  . 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 Heart palpitations 12/14/2013  . Generalized osteoarthritis of multiple sites 11/04/2013  . Cerebral infarction ([place]) 10/30/2011  . PFO (patent foramen ovale) 10/30/2011  . Bradycardia 10/30/2011  . Chest pain 10/30/2011  . Hypothyroid   . Thrombophlebitis   . Sleep apnea   . Fibromyalgia   . Status post bariatric surgery 12/07/2010  . Vein disorder 11/29/2010  . S/P total hysterectomy and bilateral salpingo-oophorectomy 11/29/2010  . S/P exploratory laparotomy 11/29/2010  . S/P cholecystectomy 11/29/2010  . S/P ACL surgery 11/29/2010  . Morbid obesity ([place]) 11/10/2010   Past Medical History:  Diagnosis Date  . ADD (attention deficit disorder)   . Arthritis    "all over"  . Cerebral infarction ([place]) 10/30/2011  . Cerebrovascular disease 08/14/2016  . Chronic back pain    "all over"  . Chronic low back pain 01/11/2016  . Complication of anesthesia    tends to have hypotension when NPO and post-anesthesia  . Constipation    takes stool softener daily   . Degenerative disk disease   . Degenerative joint disease   . DVT (deep venous thrombosis) ([place]) 2014   RLE  . Family history of adverse reaction to anesthesia    a family member woke up during surgery; "think it was my mom"  . Fibromyalgia   . Generalized osteoarthritis of multiple sites 11/04/2013  . GERD (gastroesophageal reflux disease)   . Heart palpitations 12/14/2013   resolved  . History of blood clots    superficial  . Hypoglycemia   . Hypothyroid    takes Synthroid daily  . Incomplete emptying of bladder   . Insomnia    takes Trazodone nightly  . Iron deficiency anemia    takes Ferrous Sulfate daily  . Joint pain   . Joint swelling    knees and ankles  . Memory disorder 08/14/2016  . Morbid obesity ([place])   .  Nausea    takes Zofran as needed.Seeing GI doc  . Neck pain 05/25/2016  . OSA on CPAP    tested more than 5 yrs ago.    . Osteoarthritis   . Osteopenia    in feet  . PFO (patent foramen ovale)    no murmur  . Pre-diabetes   . Primary osteoarthritis of both feet 05/29/2016   Right bunionectomy August 2017 by Dr. Sharol Given  . Scoliosis   . Skin abnormality 02/04/2020   raised area on lip  . Sleep apnea    mild osa per pt  . Stroke [facility]) "several"last 2014   right foot weakness; memory issues, black spot right visual field since" (03/23/2015)  . Thrombophlebitis   . Trochanteric bursitis of both hips 05/25/2016  . Unilateral primary osteoarthritis, right knee 05/29/2016  . Urinary urgency 04/19/2016  . Vein disorder 11/29/2010   varicose veins both legs  . Wears glasses     Family History  Problem Relation Age of Onset  . Cancer Mother        skin and breast  . Myasthenia gravis Mother   . Breast cancer Mother   . Heart disease Father   . Cancer Father   . Parkinson's disease Father   . Heart disease Sister   . Heart attack Sister   . Heart disease Brother   . Cancer Brother   . Diabetes Brother   . Stroke Brother   . Cancer Maternal Grandfather    . Hypothyroidism Daughter   . Hypertension Other   . Colon cancer Neg Hx     Past Surgical History:  Procedure Laterality Date  . BIOPSY  11/14/2018   Procedure: BIOPSY;  Surgeon: Rogene Houston, MD;  [facility];  Service: Endoscopy;;  esophagusgastric  . BONE EXCISION Right 08/29/2017   Procedure: right trapezium excision;  Surgeon: Daryll Brod, MD;  Location: [place];  Service: Orthopedics;  Laterality: Right;  . BUNIONECTOMY Right 08/2015  . CARDIAC CATHETERIZATION     2008.  "it was fine" (not sure why she had it done, and doesn't know where)  . CARPOMETACARPEL SUSPENSION PLASTY Right 08/29/2017   Procedure: SUSPENSION PLASTY RIGHT THUMB;  Surgeon: Daryll Brod, MD;  Location: [facility] [facility];  Service: Orthopedics;  Laterality: Right;  . COLONOSCOPY N/A 03/25/2013   Procedure: COLONOSCOPY;  Surgeon: Rogene Houston, MD;  [facility];  Service: Endoscopy;  Laterality: N/A;  930  . ERCP  06/02/2020  . ESOPHAGOGASTRODUODENOSCOPY    . ESOPHAGOGASTRODUODENOSCOPY (EGD) WITH PROPOFOL N/A 11/14/2018   Procedure: ESOPHAGOGASTRODUODENOSCOPY (EGD) WITH PROPOFOL;  Surgeon: Rogene Houston, MD;  [facility];  Service: Endoscopy;  Laterality: N/A;  1:55pm-office moved to 11:00am/pt notified to arrive at 9:30am per KF  . EXPLORATORY LAPAROTOMY     "took fallopian tubes out"  . HALLUX FUSION Right 02/12/2020   Procedure: HALLUX INTERPHANGEAL JOINT  FUSION;  Surgeon: Felipa Furnace, DPM;  Location: [facility];  Service: Podiatry;  Laterality: Right;  . HAMMER TOE SURGERY Right 02/12/2020   Procedure: SECOND AND THIRD HAMMER TOE CORRECTION; CAPSULOTOMY SECOND INTERPHALANGEAL JOINT;  Surgeon: Felipa Furnace, DPM;  Location: [facility];  Service: Podiatry;  Laterality: Right;  . JOINT REPLACEMENT     bil knee   . KNEE ARTHROSCOPY Left   . KNEE ARTHROSCOPY W/ ACL RECONSTRUCTION Right yrs ago   "added pins"  .  LAPAROSCOPIC CHOLECYSTECTOMY  ~ 2001  .  ROUX-EN-Y GASTRIC BYPASS  11/20/2010   [place]  . SPINAL CORD STIMULATOR INSERTION N/A 04/18/2017   Procedure: LUMBAR SPINAL CORD STIMULATOR INSERTION;  Surgeon: Clydell Hakim, MD;  Location: [facility];  Service: Neurosurgery;  Laterality: N/A;  LUMBAR SPINAL CORD STIMULATOR INSERTION  . stomach stent  04/28/2020  . TENDON TRANSFER Right 08/29/2017   Procedure: right abductor pollicis longus transfer;  Surgeon: Daryll Brod, MD;  Location: [place];  Service: Orthopedics;  Laterality: Right;  . TOTAL KNEE ARTHROPLASTY Left 03/23/2015   Procedure: TOTAL KNEE ARTHROPLASTY;  Surgeon: Newt Minion, MD;  Location: [employer];  Service: Orthopedics;  Laterality: Left;  . TOTAL KNEE ARTHROPLASTY Right 08/15/2016   Procedure: RIGHT TOTAL KNEE ARTHROPLASTY, REMOVAL ACL SCREWS;  Surgeon: Newt Minion, MD;  Location: [facility];  Service: Orthopedics;  Laterality: Right;  . TOTAL KNEE ARTHROPLASTY WITH HARDWARE REMOVAL Right   . VAGINAL HYSTERECTOMY     tah/bso  . VARICOSE VEIN SURGERY Right X 2  . WEIL OSTEOTOMY Right 02/12/2020   Procedure: DOUBLE L OSTEOTOMY;  Surgeon: Felipa Furnace, DPM;  Location: [place];  Service: Podiatry;  Laterality: Right;   Social History   Occupational History  . Occupation: Disability  . Occupation: formerly Therapist, sports, [employer]  Tobacco Use  . Smoking status: Former    Packs/day: 0.75    Years: 8.00    Pack years: 6.00    Types: Cigarettes    Quit date: 12/01/1990    Years since quitting: 30.5  . Smokeless tobacco: Never  . Tobacco comments:    quit smoking in the 1990s  Vaping Use  . Vaping Use: Never used  Substance and Sexual Activity  . Alcohol use: No    Comment: 03/23/2015 "stopped drinking in 2012 w/gastric bypass; drank socially before bypass"  . Drug use: No  . Sexual activity: Not Currently    Birth control/protection: Surgical

## 2021-06-09 NOTE — Patient Instructions (Signed)
Plan: Avoid bending, stooping and avoid lifting weights greater than 10 lbs. Avoid prolong standing and walking. Avoid frequent bending and stooping  No lifting greater than 10 lbs. May use ice or moist heat for pain. Weight loss is of benefit. Handicap license is approved. Myelogram and post myelogram CT scan of the thoracic and lumbar spine.

## 2021-06-20 ENCOUNTER — Ambulatory Visit: Payer: PPO | Admitting: Rheumatology

## 2021-06-20 ENCOUNTER — Encounter: Payer: Self-pay | Admitting: Physician Assistant

## 2021-06-20 ENCOUNTER — Ambulatory Visit (INDEPENDENT_AMBULATORY_CARE_PROVIDER_SITE_OTHER): Payer: PPO | Admitting: Physician Assistant

## 2021-06-20 VITALS — BP 109/77 | HR 79 | Ht 65.0 in | Wt 247.6 lb

## 2021-06-20 DIAGNOSIS — M62838 Other muscle spasm: Secondary | ICD-10-CM | POA: Diagnosis not present

## 2021-06-20 DIAGNOSIS — Z8672 Personal history of thrombophlebitis: Secondary | ICD-10-CM | POA: Diagnosis not present

## 2021-06-20 DIAGNOSIS — M19072 Primary osteoarthritis, left ankle and foot: Secondary | ICD-10-CM

## 2021-06-20 DIAGNOSIS — M797 Fibromyalgia: Secondary | ICD-10-CM

## 2021-06-20 DIAGNOSIS — Z9884 Bariatric surgery status: Secondary | ICD-10-CM

## 2021-06-20 DIAGNOSIS — Z96653 Presence of artificial knee joint, bilateral: Secondary | ICD-10-CM | POA: Diagnosis not present

## 2021-06-20 DIAGNOSIS — Z8639 Personal history of other endocrine, nutritional and metabolic disease: Secondary | ICD-10-CM

## 2021-06-20 DIAGNOSIS — M5134 Other intervertebral disc degeneration, thoracic region: Secondary | ICD-10-CM | POA: Diagnosis not present

## 2021-06-20 DIAGNOSIS — M19041 Primary osteoarthritis, right hand: Secondary | ICD-10-CM

## 2021-06-20 DIAGNOSIS — M5136 Other intervertebral disc degeneration, lumbar region: Secondary | ICD-10-CM

## 2021-06-20 DIAGNOSIS — M7061 Trochanteric bursitis, right hip: Secondary | ICD-10-CM

## 2021-06-20 DIAGNOSIS — M19042 Primary osteoarthritis, left hand: Secondary | ICD-10-CM

## 2021-06-20 DIAGNOSIS — M19071 Primary osteoarthritis, right ankle and foot: Secondary | ICD-10-CM

## 2021-06-20 DIAGNOSIS — M7062 Trochanteric bursitis, left hip: Secondary | ICD-10-CM

## 2021-06-20 DIAGNOSIS — M65341 Trigger finger, right ring finger: Secondary | ICD-10-CM | POA: Diagnosis not present

## 2021-06-20 DIAGNOSIS — Z8669 Personal history of other diseases of the nervous system and sense organs: Secondary | ICD-10-CM | POA: Diagnosis not present

## 2021-06-20 DIAGNOSIS — Z8673 Personal history of transient ischemic attack (TIA), and cerebral infarction without residual deficits: Secondary | ICD-10-CM

## 2021-06-20 DIAGNOSIS — Q2112 Patent foramen ovale: Secondary | ICD-10-CM

## 2021-06-20 MED ORDER — LIDOCAINE HCL 1 % IJ SOLN
0.5000 mL | INTRAMUSCULAR | Status: AC | PRN
  Administered 2021-06-20: .5 mL

## 2021-06-20 MED ORDER — TRIAMCINOLONE ACETONIDE 40 MG/ML IJ SUSP
10.0000 mg | INTRAMUSCULAR | Status: AC | PRN
  Administered 2021-06-20: 10 mg via INTRAMUSCULAR

## 2021-06-20 NOTE — Patient Instructions (Addendum)
Try to avoid red meat and processed sugars  Avoid the use of tumeric and ginger due to blood thinning properties      Mediterranean Diet A Mediterranean diet refers to food and lifestyle choices that are based on the traditions of countries located on the [employer]. It focuses on eating more fruits, vegetables, whole grains, beans, nuts, seeds, and heart-healthy fats, and eating less dairy, meat, eggs, and processed foods with added sugar, salt, and fat. This way of eating has been shown to help prevent certain conditions and improve outcomes for people who have chronic diseases, like kidney disease and heart disease. What are tips for following this plan? Reading food labels Check the serving size of packaged foods. For foods such as rice and pasta, the serving size refers to the amount of cooked product, not dry. Check the total fat in packaged foods. Avoid foods that have saturated fat or trans fats. Check the ingredient list for added sugars, such as corn syrup. Shopping  Buy a variety of foods that offer a balanced diet, including: Fresh fruits and vegetables (produce). Grains, beans, nuts, and seeds. Some of these may be available in unpackaged forms or large amounts (in bulk). Fresh seafood. Poultry and eggs. Low-fat dairy products. Buy whole ingredients instead of prepackaged foods. Buy fresh fruits and vegetables in-season from local farmers markets. Buy plain frozen fruits and vegetables. If you do not have access to quality fresh seafood, buy precooked frozen shrimp or canned fish, such as tuna, salmon, or sardines. Stock your pantry so you always have certain foods on hand, such as olive oil, canned tuna, canned tomatoes, rice, pasta, and beans. Cooking Cook foods with extra-virgin olive oil instead of using butter or other vegetable oils. Have meat as a side dish, and have vegetables or grains as your main dish. This means having meat in small portions or adding small  amounts of meat to foods like pasta or stew. Use beans or vegetables instead of meat in common dishes like chili or lasagna. Experiment with different cooking methods. Try roasting, broiling, steaming, and sauting vegetables. Add frozen vegetables to soups, stews, pasta, or rice. Add nuts or seeds for added healthy fats and plant protein at each meal. You can add these to yogurt, salads, or vegetable dishes. Marinate fish or vegetables using olive oil, lemon juice, garlic, and fresh herbs. Meal planning Plan to eat one vegetarian meal one day each week. Try to work up to two vegetarian meals, if possible. Eat seafood two or more times a week. Have healthy snacks readily available, such as: Vegetable sticks with hummus. Greek yogurt. Fruit and nut trail mix. Eat balanced meals throughout the week. This includes: Fruit: 2-3 servings a day. Vegetables: 4-5 servings a day. Low-fat dairy: 2 servings a day. Fish, poultry, or lean meat: 1 serving a day. Beans and legumes: 2 or more servings a week. Nuts and seeds: 1-2 servings a day. Whole grains: 6-8 servings a day. Extra-virgin olive oil: 3-4 servings a day. Limit red meat and sweets to only a few servings a month. Lifestyle  Cook and eat meals together with your family, when possible. Drink enough fluid to keep your urine pale yellow. Be physically active every day. This includes: Aerobic exercise like running or swimming. Leisure activities like gardening, walking, or housework. Get 7-8 hours of sleep each night. If recommended by your health care provider, drink red wine in moderation. This means 1 glass a day for nonpregnant women and 2 glasses  a day for men. A glass of wine equals 5 oz (150 mL). What foods should I eat? Fruits Apples. Apricots. Avocado. Berries. Bananas. Cherries. Dates. Figs. Grapes. Lemons. Melon. Oranges. Peaches. Plums. Pomegranate. Vegetables Artichokes. Beets. Broccoli. Cabbage. Carrots. Eggplant. Green  beans. Chard. Kale. Spinach. Onions. Leeks. Peas. Squash. Tomatoes. Peppers. Radishes. Grains Whole-grain pasta. Brown rice. Bulgur wheat. Polenta. Couscous. Whole-wheat bread. [place]. Meats and other proteins Beans. Almonds. Sunflower seeds. Pine nuts. Peanuts. [place]. Salmon. Scallops. Shrimp. [place]. Tilapia. Clams. Oysters. Eggs. Poultry without skin. Dairy Low-fat milk. Cheese. Greek yogurt. Fats and oils Extra-virgin olive oil. Avocado oil. Grapeseed oil. Beverages Water. Red wine. Herbal tea. Sweets and desserts Greek yogurt with honey. Baked apples. Poached pears. Trail mix. Seasonings and condiments Basil. Cilantro. Coriander. Cumin. Mint. Parsley. Sage. Rosemary. Tarragon. Garlic. Oregano. Thyme. Pepper. Balsamic vinegar. Tahini. Hummus. Tomato sauce. Olives. Mushrooms. The items listed above may not be a complete list of foods and beverages you can eat. Contact a dietitian for more information. What foods should I limit? This is a list of foods that should be eaten rarely or only on special occasions. Fruits Fruit canned in syrup. Vegetables Deep-fried potatoes (french fries). Grains Prepackaged pasta or rice dishes. Prepackaged cereal with added sugar. Prepackaged snacks with added sugar. Meats and other proteins Beef. Pork. Lamb. Poultry with skin. Hot dogs. [place] Salines. Dairy Ice cream. Sour cream. Whole milk. Fats and oils Butter. Canola oil. Vegetable oil. Beef fat (tallow). Lard. Beverages Juice. Sugar-sweetened soft drinks. Beer. Liquor and spirits. Sweets and desserts Cookies. Cakes. Pies. Candy. Seasonings and condiments Mayonnaise. Pre-made sauces and marinades. The items listed above may not be a complete list of foods and beverages you should limit. Contact a dietitian for more information. Summary The Mediterranean diet includes both food and lifestyle choices. Eat a variety of fresh fruits and vegetables, beans, nuts, seeds, and whole grains. Limit the  amount of red meat and sweets that you eat. If recommended by your health care provider, drink red wine in moderation. This means 1 glass a day for nonpregnant women and 2 glasses a day for men. A glass of wine equals 5 oz (150 mL).    Hand Exercises Hand exercises can be helpful for almost anyone. These exercises can strengthen the hands, improve flexibility and movement, and increase blood flow to the hands. These results can make work and daily tasks easier. Hand exercises can be especially helpful for people who have joint pain from arthritis or have nerve damage from overuse (carpal tunnel syndrome). These exercises can also help people who have injured a hand. Exercises Most of these hand exercises are gentle stretching and motion exercises. It is usually safe to do them often throughout the day. Warming up your hands before exercise may help to reduce stiffness. You can do this with gentle massage or by placing your hands in warm water for 10-15 minutes. It is normal to feel some stretching, pulling, tightness, or mild discomfort as you begin new exercises. This will gradually improve. Stop an exercise right away if you feel sudden, severe pain or your pain gets worse. Ask your health care provider which exercises are best for you. Knuckle bend or "claw" fist  Stand or sit with your arm, hand, and all five fingers pointed straight up. Make sure to keep your wrist straight during the exercise. Gently bend your fingers down toward your palm until the tips of your fingers are touching the top of your palm. Keep your big knuckle straight  and just bend the small knuckles in your fingers. Hold this position for __________ seconds. Straighten (extend) your fingers back to the starting position. Repeat this exercise 5-10 times with each hand. Full finger fist  Stand or sit with your arm, hand, and all five fingers pointed straight up. Make sure to keep your wrist straight during the  exercise. Gently bend your fingers into your palm until the tips of your fingers are touching the middle of your palm. Hold this position for __________ seconds. Extend your fingers back to the starting position, stretching every joint fully. Repeat this exercise 5-10 times with each hand. Straight fist Stand or sit with your arm, hand, and all five fingers pointed straight up. Make sure to keep your wrist straight during the exercise. Gently bend your fingers at the big knuckle, where your fingers meet your hand, and the middle knuckle. Keep the knuckle at the tips of your fingers straight and try to touch the bottom of your palm. Hold this position for __________ seconds. Extend your fingers back to the starting position, stretching every joint fully. Repeat this exercise 5-10 times with each hand. Tabletop  Stand or sit with your arm, hand, and all five fingers pointed straight up. Make sure to keep your wrist straight during the exercise. Gently bend your fingers at the big knuckle, where your fingers meet your hand, as far down as you can while keeping the small knuckles in your fingers straight. Think of forming a tabletop with your fingers. Hold this position for __________ seconds. Extend your fingers back to the starting position, stretching every joint fully. Repeat this exercise 5-10 times with each hand. Finger spread  Place your hand flat on a table with your palm facing down. Make sure your wrist stays straight as you do this exercise. Spread your fingers and thumb apart from each other as far as you can until you feel a gentle stretch. Hold this position for __________ seconds. Bring your fingers and thumb tight together again. Hold this position for __________ seconds. Repeat this exercise 5-10 times with each hand. Making circles  Stand or sit with your arm, hand, and all five fingers pointed straight up. Make sure to keep your wrist straight during the exercise. Make a  circle by touching the tip of your thumb to the tip of your index finger. Hold for __________ seconds. Then open your hand wide. Repeat this motion with your thumb and each finger on your hand. Repeat this exercise 5-10 times with each hand. Thumb motion  Sit with your forearm resting on a table and your wrist straight. Your thumb should be facing up toward the ceiling. Keep your fingers relaxed as you move your thumb. Lift your thumb up as high as you can toward the ceiling. Hold for __________ seconds. Bend your thumb across your palm as far as you can, reaching the tip of your thumb for the small finger (pinkie) side of your palm. Hold for __________ seconds. Repeat this exercise 5-10 times with each hand. Grip strengthening  Hold a stress ball or other soft ball in the middle of your hand. Slowly increase the pressure, squeezing the ball as much as you can without causing pain. Think of bringing the tips of your fingers into the middle of your palm. All of your finger joints should bend when doing this exercise. Hold your squeeze for __________ seconds, then relax. Repeat this exercise 5-10 times with each hand. Contact a health care provider if: Your  hand pain or discomfort gets much worse when you do an exercise. Your hand pain or discomfort does not improve within 2 hours after you exercise. If you have any of these problems, stop doing these exercises right away. Do not do them again unless your health care provider says that you can. Get help right away if: You develop sudden, severe hand pain or swelling. If this happens, stop doing these exercises right away. Do not do them again unless your health care provider says that you can. This information is not intended to replace advice given to you by your health care provider. Make sure you discuss any questions you have with your health care provider. Document Revised: 04/28/2020 Document Reviewed: 04/28/2020 Elsevier Patient  Education  [place].

## 2021-06-21 DIAGNOSIS — M5416 Radiculopathy, lumbar region: Secondary | ICD-10-CM | POA: Diagnosis not present

## 2021-06-21 DIAGNOSIS — M79671 Pain in right foot: Secondary | ICD-10-CM | POA: Diagnosis not present

## 2021-06-21 DIAGNOSIS — G894 Chronic pain syndrome: Secondary | ICD-10-CM | POA: Diagnosis not present

## 2021-06-21 DIAGNOSIS — M47816 Spondylosis without myelopathy or radiculopathy, lumbar region: Secondary | ICD-10-CM | POA: Diagnosis not present

## 2021-06-27 ENCOUNTER — Ambulatory Visit: Payer: PPO | Admitting: Psychiatry

## 2021-06-27 ENCOUNTER — Other Ambulatory Visit: Payer: Self-pay | Admitting: Specialist

## 2021-06-27 DIAGNOSIS — M4726 Other spondylosis with radiculopathy, lumbar region: Secondary | ICD-10-CM

## 2021-06-27 DIAGNOSIS — M4156 Other secondary scoliosis, lumbar region: Secondary | ICD-10-CM

## 2021-06-27 DIAGNOSIS — M5136 Other intervertebral disc degeneration, lumbar region: Secondary | ICD-10-CM

## 2021-06-27 NOTE — Progress Notes (Deleted)
   CC:  ***  Follow-up Visit  Last visit: 11/15/20  Brief HPI: 61 year old female with a history of remote CVA, MTHFR deficiency, fibromyalgia, gastric bypass, seizures, and migraines. She previously followed with Dr. Jannifer Franklin for migraines, memory issues, and foot pain.  At her last visit she was switched form Aimovig to [facility] for migraine prevention. She was continued on donepezil for memory. Gabapentin was increased to 800 mg TID for foot pain. Interval History: ***  Prior headache therapies: Aimovig Emgality Cymbalta 60 mg daily Topamax 100 mg BID Gabapentin 800 mg TID Lyrica - shortness of breath, swelling Nurtec 75 mg PRN Baclofen 10 mg TID Tizanidine 4 mg PRN  Physical Exam:   Vital Signs: There were no vitals taken for this visit. GENERAL:  well appearing, in no acute distress, alert  SKIN:  Color, texture, turgor normal. No rashes or lesions HEAD:  Normocephalic/atraumatic. RESP: normal respiratory effort MSK:  No gross joint deformities.   NEUROLOGICAL: Mental Status: Alert, oriented to person, place and time, Follows commands, and Speech fluent and appropriate. Cranial Nerves: PERRL, face symmetric, no dysarthria, hearing grossly intact Motor: moves all extremities equally Gait: normal-based.  IMPRESSION: ***  PLAN: *** Consider Propranolol, botox, qulipta***  Follow-up: ***  I spent a total of *** minutes on the date of the service. Discussed medication side effects, adverse reactions and drug interactions. Written educational materials and patient instructions outlining all of the above were given.  Genia Harold, MD

## 2021-06-29 ENCOUNTER — Encounter: Payer: Self-pay | Admitting: Psychiatry

## 2021-06-29 ENCOUNTER — Ambulatory Visit: Payer: PPO | Admitting: Psychiatry

## 2021-06-29 VITALS — BP 122/77 | HR 78 | Ht 65.0 in | Wt 248.0 lb

## 2021-06-29 DIAGNOSIS — H53453 Other localized visual field defect, bilateral: Secondary | ICD-10-CM | POA: Diagnosis not present

## 2021-06-29 NOTE — Patient Instructions (Signed)
CT scan of the brain Try chair yoga
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
## 2021-06-30 ENCOUNTER — Telehealth: Payer: Self-pay | Admitting: Psychiatry

## 2021-06-30 NOTE — Telephone Encounter (Signed)
HTA NPR sent to GI they will call the patient to schedule

## 2021-07-03 ENCOUNTER — Encounter (INDEPENDENT_AMBULATORY_CARE_PROVIDER_SITE_OTHER): Payer: Self-pay | Admitting: Gastroenterology

## 2021-07-03 ENCOUNTER — Ambulatory Visit (INDEPENDENT_AMBULATORY_CARE_PROVIDER_SITE_OTHER): Payer: PPO | Admitting: Gastroenterology

## 2021-07-03 VITALS — BP 113/81 | HR 73 | Temp 98.7°F | Ht 65.0 in | Wt 245.1 lb

## 2021-07-03 DIAGNOSIS — K2270 Barrett's esophagus without dysplasia: Secondary | ICD-10-CM | POA: Diagnosis not present

## 2021-07-03 DIAGNOSIS — Z9884 Bariatric surgery status: Secondary | ICD-10-CM | POA: Diagnosis not present

## 2021-07-03 DIAGNOSIS — R7989 Other specified abnormal findings of blood chemistry: Secondary | ICD-10-CM

## 2021-07-03 DIAGNOSIS — R635 Abnormal weight gain: Secondary | ICD-10-CM

## 2021-07-03 DIAGNOSIS — K219 Gastro-esophageal reflux disease without esophagitis: Secondary | ICD-10-CM

## 2021-07-03 MED ORDER — FAMOTIDINE 40 MG PO TABS: 40.0000 mg | ORAL_TABLET | Freq: Every day | ORAL | 3 refills | Status: DC

## 2021-07-03 NOTE — Patient Instructions (Signed)
I Have sent referral to nutritionist Lets continue once daily omeprazole and increase famotidine to '40mg'$  in the evening Make sure you are avoiding tomato based and citrus foods and staying upright 2-3 hours after eating, prior to lying down Please call in 2-3 weeks with an update on how symptoms are going Plan to repeat EGD for hx of barrett's esophagus in October 2023  Follow up 6 months

## 2021-07-03 NOTE — Progress Notes (Cosign Needed)
Referring Provider: Jake Samples, PA* Primary Care Physician:  Jake Samples, PA-C Primary GI Physician: Laural Golden  Chief Complaint  Patient presents with   Gastroesophageal Reflux    Follow up for refills. Takes omeprazole in the mornings and one half famotidine in the evenings. Has some concerns about hemorrhoids.    HPI:   Debbie Pineda is a 61 y.o. 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 Lucia Gaskins, 2012), hypothyroidism fibromyalgia deep venous thrombosis osteoarthrosis of multiple joints chronic back pain ADD, GERD   Patient presenting today for follow up of GERD/constipation and previously elevated LFTs.  History:  Last seen 03/02/21, history of intermittent elevation of transaminases dating back to February 2017.  She has undergone extensive work-up in the past. 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  She used to have pain in left upper quadrant but not lately.  She was seen in the office in August 2021 and her AST and ALT were 36 and 27 respectively.  Patient had blood work as a part of wellness visit and noted to have elevated alkaline phosphatase AST and ALT, prior to last visit.   She was ordered HFP with Alk phos 167 and ALT 83. abdominal US with findings of 35m CBD, [facility] with sphincterotomy.   Last LFTS feb 2022 with ALP 167 and ALT 83  Present:  Patient reports that after the ERCP last year, they "left the opening,"  she has gained 50 pounds since then. And [facility] Otherwise seems to be doing well since the procedure.   States GERD seems to be doing pretty well, though she has had a sore throat for the past few weeks and some hoarseness in the morning and does not note heartburn or acid regurgitation maybe a few times per week. She has an occasion cramping in her abdomen, usually related to  something that she ate.   She does have some constipation at times. Pain management doctor started her on something for her constipation, she is unsure of the name of it, she is taking this only a couple of times per week. She typically is having 1-2 BMs per day now that she has changed her diet.  No red flag symptoms. Patient denies melena, hematochezia, nausea, vomiting, diarrhea, constipation, dysphagia, odyonophagia, early satiety or weight loss.   ERCP: 06/02/20: No specimens collected.  - Gastric bypass. Gastrojejunal anastomosis characterized by healthy appearing mucosa. - Pre-existing gastrogastrostomy AXIOS stent. Removed and replaced with two 10Fr x 4cm double pigtail stents to maintain patency (given the anticipated need for  ampullary surveillance in the future). - A single ampullary polyp (adenomatous) Snare papillectomy of the  major papilla was performed. Resected and retrieved  with hemostasis achieved through the use of snare tip soft coagulation. - The entire main bile duct was mildly dilated. acquired. - A biliary sphincterotomy was performed.  - The biliary tree was swept and sludge was found. (Recommended repeat EGD in 6-12 months, pending path)                   Last Endoscopy:04/28/20  (EUS) The examined esophagus was endoscopically normal.       Evidence of a gastric bypass was found. A gastric pouch with a small       size was found. The staple line appeared intact. The gastrojejunal  anastomosis was characterized by healthy appearing mucosa. This was       traversed. The pouch-to-jejunum limb was characterized by healthy       appearing mucosa.       The examined jejunum was normal.   Last Colonoscopy:march 2015, normal, small eternal hemorrhoids repeat in 10 years Recommendations:    Past Medical History:  Diagnosis Date   ADD (attention deficit disorder)    Arthritis    "all over"   Cerebral infarction ([place]) 10/30/2011   Cerebrovascular disease 08/14/2016    Chronic back pain    "all over"   Chronic low back pain 94/49/6759   Complication of anesthesia    tends to have hypotension when NPO and post-anesthesia   Constipation    takes stool softener daily   Degenerative disk disease    Degenerative joint disease    DVT (deep venous thrombosis) ([place]) 2014   RLE   Family history of adverse reaction to anesthesia    a family member woke up during surgery; "think it was my mom"   Fibromyalgia    Generalized osteoarthritis of multiple sites 11/04/2013   GERD (gastroesophageal reflux disease)    Headache    Heart palpitations 12/14/2013   resolved   History of blood clots    superficial   Hypoglycemia    Hypothyroid    takes Synthroid daily   Incomplete emptying of bladder    Insomnia    takes Trazodone nightly   Iron deficiency anemia    takes Ferrous Sulfate daily   Joint pain    Joint swelling    knees and ankles   Memory disorder 08/14/2016   Morbid obesity ([place])    Nausea    takes Zofran as needed.Seeing GI doc   Neck pain 05/25/2016   OSA on CPAP    tested more than 5 yrs ago.     Osteoarthritis    Osteopenia    in feet   PFO (patent foramen ovale)    no murmur   Pre-diabetes    Primary osteoarthritis of both feet 05/29/2016   Right bunionectomy August 2017 by Dr. Sharol Given   Scoliosis    Skin abnormality 02/04/2020   raised area on lip   Sleep apnea    mild osa per pt   Stroke [facility]) "several"last 2014   right foot weakness; memory issues, black spot right visual field since" (03/23/2015)   Thrombophlebitis    Trochanteric bursitis of both hips 05/25/2016   Unilateral primary osteoarthritis, right knee 05/29/2016   Urinary urgency 04/19/2016   Vein disorder 11/29/2010   varicose veins both legs   Wears glasses     Past Surgical History:  Procedure Laterality Date   BIOPSY  11/14/2018   Procedure: BIOPSY;  Surgeon: Rogene Houston, MD;  [facility];  Service: Endoscopy;;  esophagusgastric   BONE  EXCISION Right 08/29/2017   Procedure: right trapezium excision;  Surgeon: Daryll Brod, MD;  Location: [place];  Service: Orthopedics;  Laterality: Right;   BUNIONECTOMY Right 08/2015   CARDIAC CATHETERIZATION     2008.  "it was fine" (not sure why she had it done, and doesn't know where)   CARPOMETACARPEL SUSPENSION PLASTY Right 08/29/2017   Procedure: SUSPENSION PLASTY RIGHT THUMB;  Surgeon: Daryll Brod, MD;  Location: [place];  Service: Orthopedics;  Laterality: Right;   COLONOSCOPY N/A 03/25/2013   Procedure: COLONOSCOPY;  Surgeon: Rogene Houston, MD;  [facility];  Service: Endoscopy;  Laterality: N/A;  930   ERCP  06/02/2020   ESOPHAGOGASTRODUODENOSCOPY     ESOPHAGOGASTRODUODENOSCOPY (EGD) WITH PROPOFOL N/A 11/14/2018   Procedure: ESOPHAGOGASTRODUODENOSCOPY (EGD) WITH PROPOFOL;  Surgeon: Rogene Houston, MD;  [facility];  Service: Endoscopy;  Laterality: N/A;  1:55pm-office moved to 11:00am/pt notified to arrive at 9:30am per [place]     "took fallopian tubes out"   [place] Right 02/12/2020   Procedure: HALLUX INTERPHANGEAL JOINT  FUSION;  Surgeon: Felipa Furnace, DPM;  Location: [place];  Service: Podiatry;  Laterality: Right;   HAMMER TOE SURGERY Right 02/12/2020   Procedure: SECOND AND THIRD HAMMER TOE CORRECTION; CAPSULOTOMY SECOND INTERPHALANGEAL JOINT;  Surgeon: Felipa Furnace, DPM;  Location: [place];  Service: Podiatry;  Laterality: Right;   JOINT REPLACEMENT     bil knee    KNEE ARTHROSCOPY Left    KNEE ARTHROSCOPY W/ ACL RECONSTRUCTION Right yrs ago   "added pins"   LAPAROSCOPIC CHOLECYSTECTOMY  ~ 2001   ROUX-EN-Y GASTRIC BYPASS  11/20/2010      SPINAL CORD STIMULATOR INSERTION N/A 04/18/2017   Procedure: LUMBAR SPINAL CORD STIMULATOR INSERTION;  Surgeon: Clydell Hakim, MD;  Location: [place];  Service: Neurosurgery;  Laterality: N/A;  LUMBAR SPINAL  CORD STIMULATOR INSERTION   stomach stent  04/28/2020   TENDON TRANSFER Right 08/29/2017   Procedure: right abductor pollicis longus transfer;  Surgeon: Daryll Brod, MD;  Location: [place];  Service: Orthopedics;  Laterality: Right;   TOTAL KNEE ARTHROPLASTY Left 03/23/2015   Procedure: TOTAL KNEE ARTHROPLASTY;  Surgeon: Newt Minion, MD;  Location: [place];  Service: Orthopedics;  Laterality: Left;   TOTAL KNEE ARTHROPLASTY Right 08/15/2016   Procedure: RIGHT TOTAL KNEE ARTHROPLASTY, REMOVAL ACL SCREWS;  Surgeon: Newt Minion, MD;  Location: [place];  Service: Orthopedics;  Laterality: Right;   TOTAL KNEE ARTHROPLASTY WITH HARDWARE REMOVAL Right    VAGINAL HYSTERECTOMY     tah/bso   VARICOSE VEIN SURGERY Right X 2   WEIL OSTEOTOMY Right 02/12/2020   Procedure: DOUBLE L OSTEOTOMY;  Surgeon: Felipa Furnace, DPM;  Location: [place];  Service: Podiatry;  Laterality: Right;    Current Outpatient Medications  Medication Sig Dispense Refill   amoxicillin (AMOXIL) 500 MG capsule Take 2,000 mg by mouth See admin instructions. Take 2000 mg by mouth 1 hour prior to dental appointment     baclofen (LIORESAL) 10 MG tablet Take 10 mg by mouth 3 (three) times daily.   1   Cyanocobalamin (VITAMIN B-12) 5000 MCG SUBL Place 5,000 mcg under the tongue daily.      diclofenac sodium (VOLTAREN) 1 % GEL Apply 4 g topically 4 (four) times daily as needed (PAIN). 5 Tube 1   docusate sodium (COLACE) 100 MG capsule Take 100 mg by mouth as needed for mild constipation.     donepezil (ARICEPT) 10 MG tablet Take 1 tablet (10 mg total) by mouth at bedtime. 90 tablet 3   DULoxetine (CYMBALTA) 60 MG capsule Take 60 mg by mouth 2 (two) times daily.  11   famotidine (PEPCID) 40 MG tablet Take 0.5 tablets (20 mg total) by mouth daily. 90 tablet 3   folic acid (FOLVITE) 161 MCG tablet Take 400 mcg by mouth daily.     gabapentin (NEURONTIN) 800 MG tablet Take 1 tablet (800 mg total) by mouth 4  (four) times daily. (Patient taking differently:  Take 800 mg by mouth 3 (three) times daily.) 360 tablet 1   Galcanezumab-gnlm (EMGALITY) 120 MG/ML SOSY Inject 120 mg into the skin every 30 (thirty) days. 1.12 mL 4   Krill Oil 350 MG CAPS Take 350 mg by mouth at bedtime.      levothyroxine (SYNTHROID) 125 MCG tablet Take 125 mcg by mouth daily.     Methylfol-Methylcob-Acetylcyst (METAFOLBIC PLUS) 6-2-600 MG TABS Take 1 tablet by mouth daily. 30 tablet 1   methylphenidate (CONCERTA) 27 MG PO CR tablet Take 1 tablet (27 mg total) by mouth 2 (two) times daily. 60 tablet 0   mirabegron ER (MYRBETRIQ) 50 MG TB24 tablet Take 50 mg by mouth daily.     Multiple Minerals-Vitamins (CAL-MAG-ZINC-D PO) Take 3 tablets by mouth daily.      Multiple Vitamins-Minerals (ONE-A-DAY WOMENS PETITES PO) Take by mouth. One twice daily     NARCAN 4 MG/0.1ML LIQD nasal spray kit Place 0.4 mg into the nose once.     omeprazole (PRILOSEC) 40 MG capsule Take one capsule by mouth daily before breaksfast. 90 capsule 1   ondansetron (ZOFRAN-ODT) 4 MG disintegrating tablet Take 4 mg by mouth every 8 (eight) hours as needed for nausea or vomiting.     Oxycodone HCl 10 MG TABS Take 10 mg by mouth 3 (three) times daily as needed.     Pseudoephedrine HCl (SUDAFED PO) Take by mouth as needed.     Rimegepant Sulfate (NURTEC) 75 MG TBDP Take 75 mg by mouth daily as needed. 8 tablet 5   rivaroxaban (XARELTO) 20 MG TABS tablet Take 1 tablet (20 mg total) by mouth at bedtime.     Simethicone (PHAZYME ULTRA STRENGTH) 180 MG CAPS Take 1 capsule (180 mg total) by mouth 3 (three) times daily as needed.  0   Tapentadol HCl (NUCYNTA) 100 MG TABS Take 100 mg by mouth 2 (two) times daily.     tiZANidine (ZANAFLEX) 4 MG tablet Take 1 tablet (4 mg total) by mouth every 6 (six) hours as needed for muscle spasms. 30 tablet 0   topiramate (TOPAMAX) 100 MG tablet Take 1 tablet (100 mg total) by mouth 2 (two) times daily. 60 tablet 3   traZODone  (DESYREL) 100 MG tablet Take 200 mg by mouth at bedtime.     trospium (SANCTURA) 20 MG tablet Take 20 mg by mouth 2 (two) times daily.     vitamin C (ASCORBIC ACID) 500 MG tablet Take 500 mg by mouth daily.     VITAMIN D PO Take 5,000 Units by mouth daily.      No current facility-administered medications for this visit.    Allergies as of 07/03/2021 - Review Complete 07/03/2021  Allergen Reaction Noted   Lyrica [pregabalin] Shortness Of Breath and Swelling 12/01/2015   Belsomra [suvorexant] Other (See Comments) 12/01/2015   Morphine and related Itching 10/26/2010   Sulfamethoxazole-trimethoprim Itching and Rash 03/10/2015   Tape Itching and Rash 03/10/2015    Family History  Problem Relation Age of Onset   Cancer Mother        skin and breast   Myasthenia gravis Mother    Breast cancer Mother    Heart disease Father    Cancer Father    Parkinson's disease Father    Heart disease Sister    Heart attack Sister    Heart disease Brother    Cancer Brother    Diabetes Brother    Stroke Brother    Cancer Maternal Grandfather  Hypothyroidism Daughter    Hypertension Other    Colon cancer Neg Hx     Social History   Socioeconomic History   Marital status: Married    Spouse name: Jeneen Rinks   Number of children: 1   Years of education: 14   Highest education level: Not on file  Occupational History   Occupation: Disability   Occupation: formerly Therapist, sports, [employer]  Tobacco Use   Smoking status: Former    Packs/day: 0.75    Years: 8.00    Total pack years: 6.00    Types: Cigarettes    Quit date: 12/01/1990    Years since quitting: 30.6    Passive exposure: Past   Smokeless tobacco: Never   Tobacco comments:    quit smoking in the 1990s  Vaping Use   Vaping Use: Never used  Substance and Sexual Activity   Alcohol use: No    Comment: 03/23/2015 "stopped drinking in 2012 w/gastric bypass; drank socially before bypass"   Drug use: No   Sexual activity: Not Currently    Birth  control/protection: Surgical  Other Topics Concern   Not on file  Social History Narrative   Lives with husband   Caffeine use: No soda   Mainly water, drinks decaf tea   Right handed   Social Determinants of Health   Financial Resource Strain: Not on file  Food Insecurity: Not on file  Transportation Needs: Not on file  Physical Activity: Not on file  Stress: Not on file  Social Connections: Not on file   Review of systems General: negative for malaise, night sweats, fever, chills, weight loss Neck: Negative for lumps, goiter, pain and significant neck swelling Resp: Negative for cough, wheezing, dyspnea at rest CV: Negative for chest pain, leg swelling, palpitations, orthopnea GI: denies melena, hematochezia, nausea, vomiting, diarrhea, constipation, dysphagia, odyonophagia, early satiety or unintentional weight loss. +weight gain +sore throat +hoarsness  MSK: Negative for joint pain or swelling, back pain, and muscle pain. Derm: Negative for itching or rash Psych: Denies depression, anxiety, memory loss, confusion. No homicidal or suicidal ideation.  Heme: Negative for prolonged bleeding, bruising easily, and swollen nodes. Endocrine: Negative for cold or heat intolerance, polyuria, polydipsia and goiter. Neuro: negative for tremor, gait imbalance, syncope and seizures. The remainder of the review of systems is noncontributory.  Physical Exam: BP 113/81 (BP Location: Left Arm, Patient Position: Sitting, Cuff Size: Large)   Pulse 73   Temp 98.7 F (37.1 C) (Oral)   Ht _0  (1.651 m)   Wt 245 lb 1.6 oz (111.2 kg)   BMI 40.79 kg/m  General:   Alert and oriented. No distress noted. Pleasant and cooperative.  Head:  Normocephalic and atraumatic. Eyes:  Conjuctiva clear without scleral icterus. Mouth:  Oral mucosa pink and moist. Good dentition. No lesions. Heart: Normal rate and rhythm, s1 and s2 heart sounds present.  Lungs: Clear lung sounds in all lobes. Respirations  equal and unlabored. Abdomen:  +BS, soft, non-tender and non-distended. No rebound or guarding. No HSM or masses noted. Derm: No palmar erythema or jaundice Msk:  Symmetrical without gross deformities. Normal posture. Extremities:  Without edema. Neurologic:  Alert and  oriented x4 Psych:  Alert and cooperative. Normal mood and affect.  Invalid input(s): "6 MONTHS"   ASSESSMENT: Debbie Pineda is a 61 y.o. female presenting today for follow up of GERD, constipation.  Patient doing well after ERCP last may though tells me she has had a decent amount of weight  gain, she reached out to Dr. Lysle Rubens regarding this and is in the process of having repeat EGD scheduled with him. She also inquired if we can refer her to nutritionist to help with dietary management of her weight. Will place referral. Notably she did not have repeat HFP done when it was last ordered, will repeat liver function testing at this time.   GERD seems to be doing pretty well, no breakthrough symptoms but has noted recent hoarseness and sore throat recently, unsure if this is related to her acid reflux or allergies, will continue PPI daily and increase famotidine to 48m QHS. She will let me know if symptoms persist. Should continue with reflux precautions.  She does have some constipation at times. Pain management doctor started her on something for this (she was unsure of the name) that she only takes a few times per week, She typically is having 1-2 BMs per day now that she has changed her diet. Can continue with current regimen  No red flag symptoms. Patient denies melena, hematochezia, nausea, vomiting, diarrhea, dysphagia, odyonophagia, early satiety or weight loss.   PLAN:  Continue PPI daily  2.  Increase Famotidine to 455mQHS 3.Referral to nutritionist  4. Reflux precautions  5. Repeat HFP function   All questions were answered, patient verbalized understanding and is in agreement with plan as outlined above.    Follow Up: 6 months   Azzam Mehra L. CaAlver SorrowMSN, APRN, AGNP-C Adult-Gerontology Nurse Practitioner [facility]

## 2021-07-04 ENCOUNTER — Telehealth: Payer: Self-pay | Admitting: *Deleted

## 2021-07-04 ENCOUNTER — Ambulatory Visit
Admission: RE | Admit: 2021-07-04 | Discharge: 2021-07-04 | Disposition: A | Discharge status: Home / Self Care | Payer: PPO | Source: Ambulatory Visit | Attending: Psychiatry | Admitting: Psychiatry

## 2021-07-04 DIAGNOSIS — H53453 Other localized visual field defect, bilateral: Secondary | ICD-10-CM

## 2021-07-04 DIAGNOSIS — H547 Unspecified visual loss: Secondary | ICD-10-CM | POA: Diagnosis not present

## 2021-07-04 NOTE — Telephone Encounter (Signed)
Attempted to reach patient with results. Voice MB full.

## 2021-07-04 NOTE — Telephone Encounter (Signed)
Spoke with patient and informed her that CT scan is stable. It shows her old stroke and there are no new findings. Patient verbalized understanding, appreciation.

## 2021-07-05 ENCOUNTER — Ambulatory Visit
Admission: RE | Admit: 2021-07-05 | Discharge: 2021-07-05 | Disposition: A | Discharge status: Home / Self Care | Payer: PPO | Source: Ambulatory Visit | Attending: Specialist | Admitting: Specialist

## 2021-07-05 ENCOUNTER — Other Ambulatory Visit: Payer: PPO

## 2021-07-05 DIAGNOSIS — M5114 Intervertebral disc disorders with radiculopathy, thoracic region: Secondary | ICD-10-CM | POA: Diagnosis not present

## 2021-07-05 DIAGNOSIS — M4156 Other secondary scoliosis, lumbar region: Secondary | ICD-10-CM

## 2021-07-05 DIAGNOSIS — M5136 Other intervertebral disc degeneration, lumbar region: Secondary | ICD-10-CM

## 2021-07-05 DIAGNOSIS — M4807 Spinal stenosis, lumbosacral region: Secondary | ICD-10-CM | POA: Diagnosis not present

## 2021-07-05 DIAGNOSIS — M5116 Intervertebral disc disorders with radiculopathy, lumbar region: Secondary | ICD-10-CM | POA: Diagnosis not present

## 2021-07-05 DIAGNOSIS — M4726 Other spondylosis with radiculopathy, lumbar region: Secondary | ICD-10-CM

## 2021-07-05 DIAGNOSIS — M4316 Spondylolisthesis, lumbar region: Secondary | ICD-10-CM | POA: Diagnosis not present

## 2021-07-05 DIAGNOSIS — M4804 Spinal stenosis, thoracic region: Secondary | ICD-10-CM | POA: Diagnosis not present

## 2021-07-05 DIAGNOSIS — M4315 Spondylolisthesis, thoracolumbar region: Secondary | ICD-10-CM | POA: Diagnosis not present

## 2021-07-05 DIAGNOSIS — M4727 Other spondylosis with radiculopathy, lumbosacral region: Secondary | ICD-10-CM | POA: Diagnosis not present

## 2021-07-05 MED ORDER — MEPERIDINE HCL 50 MG/ML IJ SOLN
50.0000 mg | Freq: Once | INTRAMUSCULAR | Status: AC | PRN
  Administered 2021-07-05: 50 mg via INTRAMUSCULAR

## 2021-07-05 MED ORDER — IOPAMIDOL (ISOVUE-M 300) INJECTION 61%
10.0000 mL | Freq: Once | INTRAMUSCULAR | Status: AC | PRN
  Administered 2021-07-05: 10 mL via INTRATHECAL

## 2021-07-05 MED ORDER — DIAZEPAM 5 MG PO TABS: 10.0000 mg | ORAL_TABLET | Freq: Once | ORAL | Status: DC

## 2021-07-05 MED ORDER — ONDANSETRON HCL 4 MG/2ML IJ SOLN
4.0000 mg | Freq: Once | INTRAMUSCULAR | Status: AC | PRN
  Administered 2021-07-05: 4 mg via INTRAMUSCULAR

## 2021-07-05 NOTE — Discharge Instructions (Signed)

## 2021-07-05 NOTE — Progress Notes (Signed)
Pt has spinal cord stimulator and reports she has turned it off for her CT myelogram procedure.  

## 2021-07-05 NOTE — Discharge Instr - Other Orders (Addendum)
1110: Pt reported pain 8/10, see MAR.  1143: pt resting in nursing recovery area. Pt reports pain 3/10 now. Eating crackers. No complaints at this time.

## 2021-07-06 ENCOUNTER — Telehealth: Payer: Self-pay | Admitting: Psychiatry

## 2021-07-06 DIAGNOSIS — H5340 Unspecified visual field defects: Secondary | ICD-10-CM | POA: Diagnosis not present

## 2021-07-06 NOTE — Telephone Encounter (Signed)
[facility] [place] B[place]berg) calling on behalf of Dr. Valetta Close. Pt may have had another stroke. Would like a call from Dr. Billey Gosling to speak with Dr. Valetta Close.  Dr. Valetta Close direct number: [phone]

## 2021-07-13 ENCOUNTER — Ambulatory Visit: Payer: PPO | Admitting: Specialist

## 2021-07-13 ENCOUNTER — Encounter: Payer: Self-pay | Admitting: Specialist

## 2021-07-13 VITALS — BP 133/81 | HR 79 | Ht 65.0 in | Wt 225.0 lb

## 2021-07-13 DIAGNOSIS — M4726 Other spondylosis with radiculopathy, lumbar region: Secondary | ICD-10-CM | POA: Diagnosis not present

## 2021-07-13 DIAGNOSIS — M961 Postlaminectomy syndrome, not elsewhere classified: Secondary | ICD-10-CM

## 2021-07-13 DIAGNOSIS — R635 Abnormal weight gain: Secondary | ICD-10-CM | POA: Insufficient documentation

## 2021-07-13 DIAGNOSIS — M4156 Other secondary scoliosis, lumbar region: Secondary | ICD-10-CM | POA: Diagnosis not present

## 2021-07-13 DIAGNOSIS — T85192A Other mechanical complication of implanted electronic neurostimulator (electrode) of spinal cord, initial encounter: Secondary | ICD-10-CM

## 2021-07-13 DIAGNOSIS — M4155 Other secondary scoliosis, thoracolumbar region: Secondary | ICD-10-CM

## 2021-07-13 DIAGNOSIS — M455 Ankylosing spondylitis of thoracolumbar region: Secondary | ICD-10-CM | POA: Diagnosis not present

## 2021-07-13 DIAGNOSIS — M5136 Other intervertebral disc degeneration, lumbar region: Secondary | ICD-10-CM | POA: Diagnosis not present

## 2021-07-13 DIAGNOSIS — M4805 Spinal stenosis, thoracolumbar region: Secondary | ICD-10-CM

## 2021-07-13 NOTE — Patient Instructions (Signed)
Avoid bending, stooping and avoid lifting weights greater than 10 lbs. Avoid prolong standing and walking. Order for a new walker with wheels. Surgery scheduling secretary Kandice Hams, will call you in the next week to schedule for surgery.  Surgery recommended is a removal of the spinal cord stimulator and thoracolumbar fusion T10 to S1 with TLIFs left L1-2, L2-3, L3-4 and L4-5, bilateral L5-S1 foramenotomies and left T10-11 foramenotomy this would be done with rods, screws and cages with local bone graft and allograft (donor bone graft). Take hydrocodone for for pain. Risk of surgery includes risk of infection 1 in 300 patients, bleeding 3-5%% chance you would need a transfusion.   Risk to the nerves is one in 10,000. You will need to use a brace for 3 months and wean from the brace on the 4th month. Expect improved walking and standing tolerance. Expect relief of leg pain but numbness may persist depending on the length and degree of pressure that has been present.

## 2021-08-01 ENCOUNTER — Encounter: Payer: Self-pay | Admitting: Specialist

## 2021-08-01 ENCOUNTER — Telehealth: Payer: Self-pay | Admitting: *Deleted

## 2021-08-01 NOTE — Telephone Encounter (Signed)
Primary Cardiologist:Peter Johnsie Cancel, MD   Preoperative team, please contact this patient and set up a phone call appointment for further preoperative risk assessment. Please obtain consent and complete medication review. Thank you for your help.   I confirm that oral anticoagulation therapy has been addressed by neurology in a separate encounter.   Emmaline Life, NP-C    08/01/2021, 12:23 PM [place] [address] Office [phone] Fax [phone]

## 2021-08-01 NOTE — Telephone Encounter (Signed)
Surgical clearance letter completed by Dr Billey Gosling: okay to hold Xarelto up to 3 days prior to surgery. Patient is neurologically optimized for surgery.  Letter faxed to [facility], received confirmation. Letter sent to medical records to be scanned.

## 2021-08-01 NOTE — Telephone Encounter (Signed)
I spoke with him over the phone but must have forgotten to  leave a note. I updated the encounter

## 2021-08-01 NOTE — Telephone Encounter (Signed)
   Pre-operative Risk Assessment    Patient Name: Debbie Pineda  DOB: 1960/12/05 MRN: [id number]      Request for Surgical Clearance    Procedure:   Cuming  Date of Surgery:  Clearance TBD                                 Surgeon:  [name], MD Surgeon's Group or Practice Name:  [facility] Phone number:  [phone] Fax number:  [phone]   Type of Clearance Requested:   - Medical    Type of Anesthesia:  General    Additional requests/questions:    SignedJeanann Lewandowsky   08/01/2021, 7:57 AM

## 2021-08-01 NOTE — Telephone Encounter (Signed)
Received surgical clearance letter from [facility] re: general anesthesia for multi level lumbar fusion, Xarelto instructions. Surgery schedule pending clearance. Placed on MD desk.

## 2021-08-01 NOTE — Telephone Encounter (Addendum)
Spoke with Dr. Valetta Close on the phone on 07/06/21, who reported right hemianopsia on visual field testing. This was noted on exam 06/29/21 and there is also documented right hemianopsia on exam as far back as 2013. 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 Symptoms are likely baseline secondary to her old stroke.  Debbie Pineda

## 2021-08-02 ENCOUNTER — Telehealth: Payer: PPO

## 2021-08-02 ENCOUNTER — Telehealth: Payer: Self-pay

## 2021-08-02 DIAGNOSIS — M47816 Spondylosis without myelopathy or radiculopathy, lumbar region: Secondary | ICD-10-CM | POA: Diagnosis not present

## 2021-08-02 DIAGNOSIS — M79671 Pain in right foot: Secondary | ICD-10-CM | POA: Diagnosis not present

## 2021-08-02 DIAGNOSIS — M5416 Radiculopathy, lumbar region: Secondary | ICD-10-CM | POA: Diagnosis not present

## 2021-08-02 DIAGNOSIS — G894 Chronic pain syndrome: Secondary | ICD-10-CM | POA: Diagnosis not present

## 2021-08-02 NOTE — Telephone Encounter (Signed)
Patient is scheduled for a telehealth visit on 08/04/21 for preop clearance. Med rec and consent done.

## 2021-08-02 NOTE — Telephone Encounter (Signed)
  Patient Consent for Virtual Visit         Debbie Pineda has provided verbal consent on 08/02/2021 for a virtual visit (video or telephone).   CONSENT FOR VIRTUAL VISIT FOR:  Debbie Pineda  By participating in this virtual visit I agree to the following:  I hereby voluntarily request, consent and authorize [facility] and its employed or contracted physicians, physician assistants, nurse practitioners or other licensed health care professionals (the Practitioner), to provide me with telemedicine health care services (the "Services") as deemed necessary by the treating Practitioner. I acknowledge and consent to receive the Services by the Practitioner via telemedicine. I understand that the telemedicine visit will involve communicating with the Practitioner through live audiovisual communication technology and the disclosure of certain medical information by electronic transmission. I acknowledge that I have been given the opportunity to request an in-person assessment or other available alternative prior to the telemedicine visit and am voluntarily participating in the telemedicine visit.  I understand that I have the right to withhold or withdraw my consent to the use of telemedicine in the course of my care at any time, without affecting my right to future care or treatment, and that the Practitioner or I may terminate the telemedicine visit at any time. I understand that I have the right to inspect all information obtained and/or recorded in the course of the telemedicine visit and may receive copies of available information for a reasonable fee.  I understand that some of the potential risks of receiving the Services via telemedicine include:  Delay or interruption in medical evaluation due to technological equipment failure or disruption; Information transmitted may not be sufficient (e.g. poor resolution of images) to allow for appropriate medical decision making by the Practitioner;  and/or  In rare instances, security protocols could fail, causing a breach of personal health information.  Furthermore, I acknowledge that it is my responsibility to provide information about my medical history, conditions and care that is complete and accurate to the best of my ability. I acknowledge that Practitioner's advice, recommendations, and/or decision may be based on factors not within their control, such as incomplete or inaccurate data provided by me or distortions of diagnostic images or specimens that may result from electronic transmissions. I understand that the practice of medicine is not an exact science and that Practitioner makes no warranties or guarantees regarding treatment outcomes. I acknowledge that a copy of this consent can be made available to me via my patient portal ([facility]), or I can request a printed copy by calling the office of [facility].    I understand that my insurance will be billed for this visit.   I have read or had this consent read to me. I understand the contents of this consent, which adequately explains the benefits and risks of the Services being provided via telemedicine.  I have been provided ample opportunity to ask questions regarding this consent and the Services and have had my questions answered to my satisfaction. I give my informed consent for the services to be provided through the use of telemedicine in my medical care

## 2021-08-03 DIAGNOSIS — Z882 Allergy status to sulfonamides status: Secondary | ICD-10-CM | POA: Diagnosis not present

## 2021-08-03 DIAGNOSIS — Z8673 Personal history of transient ischemic attack (TIA), and cerebral infarction without residual deficits: Secondary | ICD-10-CM | POA: Diagnosis not present

## 2021-08-03 DIAGNOSIS — Z9889 Other specified postprocedural states: Secondary | ICD-10-CM | POA: Diagnosis not present

## 2021-08-03 DIAGNOSIS — E039 Hypothyroidism, unspecified: Secondary | ICD-10-CM | POA: Diagnosis not present

## 2021-08-03 DIAGNOSIS — F418 Other specified anxiety disorders: Secondary | ICD-10-CM | POA: Diagnosis not present

## 2021-08-03 DIAGNOSIS — Z7901 Long term (current) use of anticoagulants: Secondary | ICD-10-CM | POA: Diagnosis not present

## 2021-08-03 DIAGNOSIS — R269 Unspecified abnormalities of gait and mobility: Secondary | ICD-10-CM | POA: Diagnosis not present

## 2021-08-03 DIAGNOSIS — Z885 Allergy status to narcotic agent status: Secondary | ICD-10-CM | POA: Diagnosis not present

## 2021-08-03 DIAGNOSIS — E559 Vitamin D deficiency, unspecified: Secondary | ICD-10-CM | POA: Diagnosis not present

## 2021-08-03 DIAGNOSIS — Z980 Intestinal bypass and anastomosis status: Secondary | ICD-10-CM | POA: Diagnosis not present

## 2021-08-03 DIAGNOSIS — Z79899 Other long term (current) drug therapy: Secondary | ICD-10-CM | POA: Diagnosis not present

## 2021-08-03 DIAGNOSIS — F341 Dysthymic disorder: Secondary | ICD-10-CM | POA: Diagnosis not present

## 2021-08-03 DIAGNOSIS — D135 Benign neoplasm of extrahepatic bile ducts: Secondary | ICD-10-CM | POA: Diagnosis not present

## 2021-08-03 DIAGNOSIS — H8110 Benign paroxysmal vertigo, unspecified ear: Secondary | ICD-10-CM | POA: Diagnosis not present

## 2021-08-03 DIAGNOSIS — G4700 Insomnia, unspecified: Secondary | ICD-10-CM | POA: Diagnosis not present

## 2021-08-03 DIAGNOSIS — K317 Polyp of stomach and duodenum: Secondary | ICD-10-CM | POA: Diagnosis not present

## 2021-08-04 ENCOUNTER — Encounter: Payer: Self-pay | Admitting: Physician Assistant

## 2021-08-04 ENCOUNTER — Ambulatory Visit (INDEPENDENT_AMBULATORY_CARE_PROVIDER_SITE_OTHER): Payer: PPO | Admitting: Physician Assistant

## 2021-08-04 DIAGNOSIS — Z01810 Encounter for preprocedural cardiovascular examination: Secondary | ICD-10-CM

## 2021-08-04 NOTE — Progress Notes (Addendum)
Virtual Visit via Telephone Note   Because of Annalisia [place]'s co-morbid illnesses, she is at least at moderate risk for complications without adequate follow up.  This format is felt to be most appropriate for this patient at this time.  The patient did not have access to video technology/had technical difficulties with video requiring transitioning to audio format only (telephone).  All issues noted in this document were discussed and addressed.  No physical exam could be performed with this format.  Please refer to the patient's chart for her consent to telehealth for [facility].  Evaluation Performed:  Preoperative cardiovascular risk assessment _____________   Date:  08/04/2021   Patient ID:  Debbie Pineda, DOB [birth year], MRN [id number] Patient Location:  Home Provider location:   Office  Primary Care Provider:  Jake Samples, PA-C Primary Cardiologist:  Jenkins Rouge, MD  Chief Complaint / Patient Profile   61 y.o. 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  Past Medical History    Past Medical History:  Diagnosis Date   ADD (attention deficit disorder)    Arthritis    "all over"   Cerebral infarction ([place]) 10/30/2011   Cerebrovascular disease 08/14/2016   Chronic back pain    "all over"   Chronic low back pain 07/62/2633   Complication of anesthesia    tends to have hypotension when NPO and post-anesthesia   Constipation    takes stool softener daily   Degenerative disk disease    Degenerative joint disease    DVT (deep venous thrombosis) ([place]) 2014   RLE   Family history of adverse reaction to anesthesia    a family member woke up during surgery; "think it was my mom"   Fibromyalgia    Generalized osteoarthritis of multiple sites 11/04/2013   GERD  (gastroesophageal reflux disease)    Headache    Heart palpitations 12/14/2013   resolved   History of blood clots    superficial   Hypoglycemia    Hypothyroid    takes Synthroid daily   Incomplete emptying of bladder    Insomnia    takes Trazodone nightly   Iron deficiency anemia    takes Ferrous Sulfate daily   Joint pain    Joint swelling    knees and ankles   Memory disorder 08/14/2016   Morbid obesity ([place])    Nausea    takes Zofran as needed.Seeing GI doc   Neck pain 05/25/2016   OSA on CPAP    tested more than 5 yrs ago.     Osteoarthritis    Osteopenia    in feet   PFO (patent foramen ovale)    no murmur   Pre-diabetes    Primary osteoarthritis of both feet 05/29/2016   Right bunionectomy August 2017 by Dr. Sharol Given   Scoliosis    Skin abnormality 02/04/2020   raised area on lip   Sleep apnea    mild osa per pt   Stroke [facility]) "several"last 2014   right foot weakness; memory issues, black spot right visual field since" (03/23/2015)   Thrombophlebitis    Trochanteric bursitis of both hips 05/25/2016   Unilateral primary osteoarthritis, right knee 05/29/2016   Urinary urgency 04/19/2016   Vein disorder 11/29/2010   varicose veins both legs   Wears glasses  Past Surgical History:  Procedure Laterality Date   BIOPSY  11/14/2018   Procedure: BIOPSY;  Surgeon: Rogene Houston, MD;  [facility];  Service: Endoscopy;;  esophagusgastric   BONE EXCISION Right 08/29/2017   Procedure: right trapezium excision;  Surgeon: Daryll Brod, MD;  Location: [place];  Service: Orthopedics;  Laterality: Right;   BUNIONECTOMY Right 08/2015   CARDIAC CATHETERIZATION     2008.  "it was fine" (not sure why she had it done, and doesn't know where)   CARPOMETACARPEL SUSPENSION PLASTY Right 08/29/2017   Procedure: SUSPENSION PLASTY RIGHT THUMB;  Surgeon: Daryll Brod, MD;  Location: [place];  Service: Orthopedics;  Laterality: Right;    COLONOSCOPY N/A 03/25/2013   Procedure: COLONOSCOPY;  Surgeon: Rogene Houston, MD;  [facility];  Service: Endoscopy;  Laterality: N/A;  930   ERCP  06/02/2020   ESOPHAGOGASTRODUODENOSCOPY     ESOPHAGOGASTRODUODENOSCOPY (EGD) WITH PROPOFOL N/A 11/14/2018   Procedure: ESOPHAGOGASTRODUODENOSCOPY (EGD) WITH PROPOFOL;  Surgeon: Rogene Houston, MD;  [facility];  Service: Endoscopy;  Laterality: N/A;  1:55pm-office moved to 11:00am/pt notified to arrive at 9:30am per [place]     "took fallopian tubes out"   [place] Right 02/12/2020   Procedure: HALLUX INTERPHANGEAL JOINT  FUSION;  Surgeon: Felipa Furnace, DPM;  Location: [place];  Service: Podiatry;  Laterality: Right;   HAMMER TOE SURGERY Right 02/12/2020   Procedure: SECOND AND THIRD HAMMER TOE CORRECTION; CAPSULOTOMY SECOND INTERPHALANGEAL JOINT;  Surgeon: Felipa Furnace, DPM;  Location: [place];  Service: Podiatry;  Laterality: Right;   JOINT REPLACEMENT     bil knee    KNEE ARTHROSCOPY Left    KNEE ARTHROSCOPY W/ ACL RECONSTRUCTION Right yrs ago   "added pins"   LAPAROSCOPIC CHOLECYSTECTOMY  ~ 2001   ROUX-EN-Y GASTRIC BYPASS  11/20/2010   [place]   SPINAL CORD STIMULATOR INSERTION N/A 04/18/2017   Procedure: LUMBAR SPINAL CORD STIMULATOR INSERTION;  Surgeon: Clydell Hakim, MD;  Location: [place];  Service: Neurosurgery;  Laterality: N/A;  LUMBAR SPINAL CORD STIMULATOR INSERTION   stomach stent  04/28/2020   TENDON TRANSFER Right 08/29/2017   Procedure: right abductor pollicis longus transfer;  Surgeon: Daryll Brod, MD;  Location: [place];  Service: Orthopedics;  Laterality: Right;   TOTAL KNEE ARTHROPLASTY Left 03/23/2015   Procedure: TOTAL KNEE ARTHROPLASTY;  Surgeon: Newt Minion, MD;  Location: [place];  Service: Orthopedics;  Laterality: Left;   TOTAL KNEE ARTHROPLASTY Right 08/15/2016   Procedure: RIGHT TOTAL KNEE ARTHROPLASTY, REMOVAL  ACL SCREWS;  Surgeon: Newt Minion, MD;  Location: [place];  Service: Orthopedics;  Laterality: Right;   TOTAL KNEE ARTHROPLASTY WITH HARDWARE REMOVAL Right    VAGINAL HYSTERECTOMY     tah/bso   VARICOSE VEIN SURGERY Right X 2   WEIL OSTEOTOMY Right 02/12/2020   Procedure: DOUBLE L OSTEOTOMY;  Surgeon: Felipa Furnace, DPM;  Location: [place];  Service: Podiatry;  Laterality: Right;    Allergies  Allergies  Allergen Reactions   Lyrica [Pregabalin] Shortness Of Breath and Swelling    lower extremity edema and weight gain   Belsomra [Suvorexant] Other (See Comments)    unknown   Morphine And Related Itching    Upper torso   Sulfamethoxazole-Trimethoprim Itching and Rash    Bactrim   Tape Itching and Rash    Please use "paper"  tape Rash if left on longer than 24 hrs    History of Present Illness    Debbie Pineda is a 61 y.o. female who presents via audio/video conferencing for a telehealth visit today.  Pt was last seen in cardiology clinic on 01/09/2021 by Dr. Johnsie Cancel.  At that time SANAIYA WELLIVER was doing well .  The patient is now pending procedure as outlined above.   Since her last visit, she states that she has been fine.  She has had no issues from a heart standpoint.  She has had no shortness of breath or chest pain.  She walks 1-2 blocks without an issue.  She does have a hard time with stairs because of her knees.  She also has a bad back so she is limited by that.  She does do some vacuuming and dusting inside.  Her husband helps with outdoor work.  She does have some grandchildren that she tries to keep up with, 2 granddaughters.  She also does some work outside with some small tomato plants.  Because of this she is scored a 4.4 METS on the DASI scale.  This meets the minimum requirement of 4 METS.  She states that she was told to hold her Xarelto 1 week prior to her procedure.  I do not see on the requested clearance anywhere for pharmacologic  clearance.  We will refer to neurology since this is the prescribing physician and she has a history of CVA/factor V Leiden.    Home Medications    Prior to Admission medications   Medication Sig Start Date End Date Taking? Authorizing Provider  amoxicillin (AMOXIL) 500 MG capsule Take 2,000 mg by mouth See admin instructions. Take 2000 mg by mouth 1 hour prior to dental appointment    [provider]  baclofen (LIORESAL) 10 MG tablet Take 10 mg by mouth 3 (three) times daily.  06/30/17   [provider]  Cyanocobalamin (VITAMIN B-12) 5000 MCG SUBL Place 5,000 mcg under the tongue daily.     [provider]  diclofenac sodium (VOLTAREN) 1 % GEL Apply 4 g topically 4 (four) times daily as needed (PAIN). 02/06/18   Jessy Oto, MD  docusate sodium (COLACE) 100 MG capsule Take 100 mg by mouth as needed for mild constipation.    [provider]  donepezil (ARICEPT) 10 MG tablet Take 1 tablet (10 mg total) by mouth at bedtime. 03/08/21   Suzzanne Cloud, NP  DULoxetine (CYMBALTA) 60 MG capsule Take 60 mg by mouth 2 (two) times daily. 01/17/15   [provider]  famotidine (PEPCID) 40 MG tablet Take 1 tablet (40 mg total) by mouth at bedtime. 07/03/21   Carlan, Chelsea L, NP  folic acid (FOLVITE) 962 MCG tablet Take 400 mcg by mouth daily.    [provider]  gabapentin (NEURONTIN) 800 MG tablet Take 1 tablet (800 mg total) by mouth 4 (four) times daily. Patient taking differently: Take 800 mg by mouth 3 (three) times daily. 11/15/20   [name], MD  Galcanezumab-gnlm [facility]) 120 MG/ML SOSY Inject 120 mg into the skin every 30 (thirty) days. 04/17/21   Genia Harold, MD  Krill Oil 350 MG CAPS Take 350 mg by mouth at bedtime.     [provider]  levothyroxine (SYNTHROID) 125 MCG tablet Take 125 mcg by mouth daily. 12/16/20   [provider]  lubiprostone (AMITIZA) 24 MCG capsule Take 24 mcg by mouth 2 (two) times daily.  05/29/21   [provider]  Methylfol-Methylcob-Acetylcyst (METAFOLBIC PLUS) 6-2-600 MG TABS Take 1 tablet by mouth daily. 05/29/21   Suzzanne Cloud, NP  methylphenidate (CONCERTA) 27 MG PO CR tablet Take 1 tablet (27 mg total) by mouth 2 (two) times daily. 01/21/19   Donnal Moat T, PA-C  mirabegron ER (MYRBETRIQ) 50 MG TB24 tablet Take 50 mg by mouth daily.    [provider]  Multiple Minerals-Vitamins (CAL-MAG-ZINC-D PO) Take 3 tablets by mouth daily.     [provider]  Multiple Vitamins-Minerals (ONE-A-DAY WOMENS PETITES PO) Take by mouth. One twice daily    [provider]  NARCAN 4 MG/0.1ML LIQD nasal spray kit Place 0.4 mg into the nose once. 11/14/16   [provider]  omeprazole (PRILOSEC) 40 MG capsule Take one capsule by mouth daily before breaksfast. 05/11/21   Rehman, Mechele Dawley, MD  ondansetron (ZOFRAN-ODT) 4 MG disintegrating tablet Take 4 mg by mouth every 8 (eight) hours as needed for nausea or vomiting.    [provider]  Oxycodone HCl 10 MG TABS Take 10 mg by mouth 3 (three) times daily as needed. Patient not taking: Reported on 08/02/2021 06/24/21   [provider]  Pseudoephedrine HCl (SUDAFED PO) Take by mouth as needed.    [provider]  Rimegepant Sulfate (NURTEC) 75 MG TBDP Take 75 mg by mouth daily as needed. 11/15/20   [name], MD  rivaroxaban (XARELTO) 20 MG TABS tablet Take 1 tablet (20 mg total) by mouth at bedtime. 11/15/18   Rehman, Mechele Dawley, MD  Simethicone (PHAZYME ULTRA STRENGTH) 180 MG CAPS Take 1 capsule (180 mg total) by mouth 3 (three) times daily as needed. 09/22/19   Rehman, Mechele Dawley, MD  Tapentadol HCl (NUCYNTA) 100 MG TABS Take 100 mg by mouth 2 (two) times daily.    [provider]  tiZANidine (ZANAFLEX) 4 MG tablet Take 1 tablet (4 mg total) by mouth every 6 (six) hours as needed for muscle spasms. 09/04/19   Jessy Oto, MD  topiramate (TOPAMAX) 100 MG tablet Take 1  tablet (100 mg total) by mouth 2 (two) times daily. 05/11/21   Genia Harold, MD  traZODone (DESYREL) 100 MG tablet Take 200 mg by mouth at bedtime.    [provider]  trospium (SANCTURA) 20 MG tablet Take 20 mg by mouth 2 (two) times daily. 06/07/20   [provider]  vitamin C (ASCORBIC ACID) 500 MG tablet Take 500 mg by mouth daily.    [provider]  VITAMIN D PO Take 5,000 Units by mouth daily.     [provider]    Physical Exam    Vital Signs:  MERSADES BARBARO does not have vital signs available for review today.  Given telephonic nature of communication, physical exam is limited. AAOx3. NAD. Normal affect.  Speech and respirations are unlabored.  Accessory Clinical Findings    None  Assessment & Plan    1.  Preoperative Cardiovascular Risk Assessment:  Ms. Shouse's perioperative risk of a major cardiac event is 6.6% according to the Revised Cardiac Risk Index (RCRI).  Therefore, she is at high risk for perioperative complications.   Her functional capacity is fair at 4.4 METs according to the [place] Activity Status Index (DASI). Recommendations: According to ACC/AHA guidelines, no further cardiovascular testing needed.  The patient may proceed to surgery at acceptable risk.   Antiplatelet and/or Anticoagulation Recommendations: She states she was told to hold Xarelto  1 week prior to procedure.    Time:   Today, I have spent 10 minutes with the patient with telehealth technology discussing medical history, symptoms, and management plan.     Elgie Collard, PA-C  08/04/2021, 8:56 AM

## 2021-08-04 NOTE — Telephone Encounter (Signed)
Pt on Xarelto for hx of Factor V Leiden deficiency and hx of PFO/stroke. Cardiology has not been managing anticoagulation, clearance recs should come from managing provider.

## 2021-08-09 ENCOUNTER — Other Ambulatory Visit: Payer: Self-pay

## 2021-08-09 DIAGNOSIS — R7989 Other specified abnormal findings of blood chemistry: Secondary | ICD-10-CM | POA: Diagnosis not present

## 2021-08-10 ENCOUNTER — Encounter: Payer: Self-pay | Admitting: Surgery

## 2021-08-10 ENCOUNTER — Ambulatory Visit (INDEPENDENT_AMBULATORY_CARE_PROVIDER_SITE_OTHER): Payer: PPO | Admitting: Surgery

## 2021-08-10 VITALS — BP 121/81 | HR 71 | Temp 98.3°F | Resp 20 | Ht 64.0 in | Wt 262.0 lb

## 2021-08-10 DIAGNOSIS — M5416 Radiculopathy, lumbar region: Secondary | ICD-10-CM

## 2021-08-10 DIAGNOSIS — M4805 Spinal stenosis, thoracolumbar region: Secondary | ICD-10-CM

## 2021-08-10 LAB — HEPATIC FUNCTION PANEL
AG Ratio: 1.7 (calc) (ref 1.0–2.5)
ALT: 13 U/L (ref 6–29)
AST: 14 U/L (ref 10–35)
Albumin: 3.7 g/dL (ref 3.6–5.1)
Alkaline phosphatase (APISO): 87 U/L (ref 37–153)
Bilirubin, Direct: 0.1 mg/dL (ref 0.0–0.2)
Globulin: 2.2 g/dL (calc) (ref 1.9–3.7)
Indirect Bilirubin: 0.2 mg/dL (calc) (ref 0.2–1.2)
Total Bilirubin: 0.3 mg/dL (ref 0.2–1.2)
Total Protein: 5.9 g/dL — ABNORMAL LOW (ref 6.1–8.1)

## 2021-08-10 NOTE — Progress Notes (Signed)
Office Visit Note   Patient: Debbie Pineda           Date of Birth: [birth year]           MRN: [id number] Visit Date: 08/10/2021              Requested by: Debbie Samples, PA-C [address] [place],  [place] [id number] PCP: Debbie Samples, PA-C   Assessment & Plan: Visit Diagnoses:  1. Lumbar radiculopathy   2. Spinal stenosis of thoracolumbar region     Plan: We will proceed Removal of the spinal cord stimulator and thoracolumbar fusion T10 to S1 with Transforaminal Lumbar Interbody Fusions left L1-2, L2-3, L3-4 and L4-5, bilateral L5-S1 foramenotomies and left T10-11 foramenotomy this would be done with rods, screws and cages with local bone graft and allograft, Vivigen as scheduled.  Surgical procedure discussed along with potential recovery time.  Patient is planning on doing her rehab at [facility] postop.  Hold Xarelto 3 days preop as instructed by neurology.  All questions answered. Follow-Up Instructions: No follow-ups on file.   Orders:  No orders of the defined types were placed in this encounter.  No orders of the defined types were placed in this encounter.     Procedures: No procedures performed   Clinical Data: No additional findings.   Subjective: Chief Complaint  Patient presents with   Lower Back - Pain    Pre- op    HPI 61 year old female with history of T10-S1 HNP/stenosis comes in for prep evaluation.  States that symptoms unchanged from previous visit.  She is wanting to proceed with Removal of the spinal cord stimulator and thoracolumbar fusion T10 to S1 with Transforaminal Lumbar Interbody Fusions left L1-2, L2-3, L3-4 and L4-5, bilateral L5-S1 foramenotomies and left T10-11 foramenotomy this would be done with rods, screws and cages with local bone graft and allograft, Vivigen as scheduled.  Today history and physical performed.  Review of systems negative.  Preop cardiology and neurology clearance requested.  Cardiologist note  documented:  Preoperative Cardiovascular Risk Assessment:   [name]'s perioperative risk of a major cardiac event is 6.6% according to the Revised Cardiac Risk Index (RCRI).  Therefore, she is at high risk for perioperative complications.   Her functional capacity is fair at 4.4 METs according to the Duke Activity Status Index (DASI).  Neurology recommended holding her Xarelto 3 days preop with her history of factor V Leiden. Review of Systems  Constitutional:  Positive for activity change.  HENT: Negative.    Respiratory:  Positive for apnea (Sleep apnea).   Musculoskeletal:  Positive for back pain and gait problem.  Neurological:  Positive for numbness.  Psychiatric/Behavioral: Negative.       Objective: Vital Signs: BP 121/81 (BP Location: Left Arm, Patient Position: Sitting, Cuff Size: Large)   Pulse 71   Temp 98.3 F (36.8 C) (Oral)   Resp 20   Ht '5\' 4"'$  (1.626 m)   Wt 262 lb (118.8 kg)   SpO2 97%   BMI 44.97 kg/m   Physical Exam Constitutional:      Appearance: She is obese.  HENT:     Head: Normocephalic and atraumatic.     Nose: Nose normal.  Eyes:     Extraocular Movements: Extraocular movements intact.  Cardiovascular:     Rate and Rhythm: Normal rate and regular rhythm.  Pulmonary:     Effort: Pulmonary effort is normal. No respiratory distress.     Breath sounds: Normal  breath sounds.  Abdominal:     General: Bowel sounds are normal. There is no distension.     Tenderness: There is no abdominal tenderness.  Neurological:     Mental Status: She is alert and oriented to person, place, and time.  Psychiatric:        Mood and Affect: Mood normal.     Ortho Exam  Specialty Comments:  No specialty comments available.  Imaging: No results found.   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   Gastroesophageal reflux disease 09/22/2019   SSBE (short-segment Barrett's esophagus) 09/22/2019   Flatulence 09/22/2019   Migraine 11/11/2018   Abdominal pain, epigastric 10/14/2018   LUQ pain 10/14/2018   Attention deficit hyperactivity disorder (ADHD) 01/12/2018   Insomnia 01/12/2018   Elevated liver enzymes 09/10/2017   History of diabetes mellitus 06/06/2017   Primary osteoarthritis of right hand 06/06/2017   Transaminasemia    TIA (transient ischemic attack) 05/01/2017   Dysphasia 05/01/2017   Chronic pain syndrome 05/01/2017   Confusion    Presence of right artificial knee joint 09/17/2016   H/O total knee replacement, right 08/15/2016   Presence of retained hardware    Memory disorder 08/14/2016   Cerebrovascular disease 08/14/2016   Unilateral primary osteoarthritis, right knee 05/29/2016   Primary osteoarthritis of both feet 05/29/2016   Trochanteric bursitis of both hips 05/25/2016   Neck pain 05/25/2016   Urinary urgency 04/19/2016   Chronic low back pain 01/11/2016   Depression 11/29/2015   Total knee replacement status 03/23/2015   Heart palpitations 12/14/2013   Generalized osteoarthritis of multiple sites 11/04/2013   Elevated LFTs 03/17/2012   Cerebral infarction ([place]) 10/30/2011   PFO (patent foramen ovale) 10/30/2011   Bradycardia 10/30/2011   Chest pain 10/30/2011   Hypothyroid    Thrombophlebitis    Sleep apnea    Fibromyalgia    Status post bariatric surgery 12/07/2010   Vein disorder 11/29/2010   S/P total hysterectomy and bilateral salpingo-oophorectomy 11/29/2010   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   S/P cholecystectomy 11/29/2010   S/P ACL surgery 11/29/2010   Morbid obesity ([place]) 11/10/2010   Past Medical History:  Diagnosis Date   ADD (attention deficit disorder)    Arthritis    "all over"   Cerebral infarction ([place]) 10/30/2011   Cerebrovascular  disease 08/14/2016   Chronic back pain    "all over"   Chronic low back pain 78/24/2353   Complication of anesthesia    tends to have hypotension when NPO and post-anesthesia   Constipation    takes stool softener daily   Degenerative disk disease    Degenerative joint disease    DVT (deep venous thrombosis) ([place]) 2014   RLE   Family history of adverse reaction to anesthesia    a family member woke up during surgery; "think it was my mom"   Fibromyalgia    Generalized osteoarthritis of multiple sites 11/04/2013   GERD (gastroesophageal reflux disease)    Headache    Heart palpitations 12/14/2013   resolved   History of blood clots    superficial   Hypoglycemia    Hypothyroid    takes Synthroid daily   Incomplete emptying of bladder    Insomnia    takes Trazodone nightly   Iron deficiency anemia  takes Ferrous Sulfate daily   Joint pain    Joint swelling    knees and ankles   Memory disorder 08/14/2016   Morbid obesity ([place])    Nausea    takes Zofran as needed.Seeing GI doc   Neck pain 05/25/2016   OSA on CPAP    tested more than 5 yrs ago.     Osteoarthritis    Osteopenia    in feet   PFO (patent foramen ovale)    no murmur   Pre-diabetes    Primary osteoarthritis of both feet 05/29/2016   Right bunionectomy August 2017 by Dr. Sharol Given   Scoliosis    Skin abnormality 02/04/2020   raised area on lip   Sleep apnea    mild osa per pt   Stroke [facility]) "several"last 2014   right foot weakness; memory issues, black spot right visual field since" (03/23/2015)   Thrombophlebitis    Trochanteric bursitis of both hips 05/25/2016   Unilateral primary osteoarthritis, right knee 05/29/2016   Urinary urgency 04/19/2016   Vein disorder 11/29/2010   varicose veins both legs   Wears glasses     Family History  Problem Relation Age of Onset   Cancer Mother        skin and breast   Myasthenia gravis Mother    Breast cancer Mother    Heart disease Father    Cancer Father     Parkinson's disease Father    Heart disease Sister    Heart attack Sister    Heart disease Brother    Cancer Brother    Diabetes Brother    Stroke Brother    Cancer Maternal Grandfather    Hypothyroidism Daughter    Hypertension Other    Colon cancer Neg Hx     Past Surgical History:  Procedure Laterality Date   BIOPSY  11/14/2018   Procedure: BIOPSY;  Surgeon: Rogene Houston, MD;  [facility];  Service: Endoscopy;;  esophagusgastric   BONE EXCISION Right 08/29/2017   Procedure: right trapezium excision;  Surgeon: Daryll Brod, MD;  Location: [place];  Service: Orthopedics;  Laterality: Right;   BUNIONECTOMY Right 08/2015   CARDIAC CATHETERIZATION     2008.  "it was fine" (not sure why she had it done, and doesn't know where)   CARPOMETACARPEL SUSPENSION PLASTY Right 08/29/2017   Procedure: SUSPENSION PLASTY RIGHT THUMB;  Surgeon: Daryll Brod, MD;  Location: [place];  Service: Orthopedics;  Laterality: Right;   COLONOSCOPY N/A 03/25/2013   Procedure: COLONOSCOPY;  Surgeon: Rogene Houston, MD;  [facility];  Service: Endoscopy;  Laterality: N/A;  930   ERCP  06/02/2020   ESOPHAGOGASTRODUODENOSCOPY     ESOPHAGOGASTRODUODENOSCOPY (EGD) WITH PROPOFOL N/A 11/14/2018   Procedure: ESOPHAGOGASTRODUODENOSCOPY (EGD) WITH PROPOFOL;  Surgeon: Rogene Houston, MD;  [facility];  Service: Endoscopy;  Laterality: N/A;  1:55pm-office moved to 11:00am/pt notified to arrive at 9:30am per Clar[place]     "took fallopian tubes out"   [place] Right 02/12/2020   Procedure: HALLUX INTERPHANGEAL JOINT  FUSION;  Surgeon: Felipa Furnace, DPM;  Location: [place];  Service: Podiatry;  Laterality: Right;   HAMMER TOE SURGERY Right 02/12/2020   Procedure: SECOND AND THIRD HAMMER TOE CORRECTION; CAPSULOTOMY SECOND INTERPHALANGEAL JOINT;  Surgeon: Felipa Furnace, DPM;  Location: [place];   Service: Podiatry;  Laterality: Right;   JOINT REPLACEMENT  bil knee    KNEE ARTHROSCOPY Left    KNEE ARTHROSCOPY W/ ACL RECONSTRUCTION Right yrs ago   "added pins"   LAPAROSCOPIC CHOLECYSTECTOMY  ~ 2001   ROUX-EN-Y GASTRIC BYPASS  11/20/2010   [place]   SPINAL CORD STIMULATOR INSERTION N/A 04/18/2017   Procedure: LUMBAR SPINAL CORD STIMULATOR INSERTION;  Surgeon: Clydell Hakim, MD;  Location: [place];  Service: Neurosurgery;  Laterality: N/A;  LUMBAR SPINAL CORD STIMULATOR INSERTION   stomach stent  04/28/2020   TENDON TRANSFER Right 08/29/2017   Procedure: right abductor pollicis longus transfer;  Surgeon: Daryll Brod, MD;  Location: [place];  Service: Orthopedics;  Laterality: Right;   TOTAL KNEE ARTHROPLASTY Left 03/23/2015   Procedure: TOTAL KNEE ARTHROPLASTY;  Surgeon: Newt Minion, MD;  Location: [place];  Service: Orthopedics;  Laterality: Left;   TOTAL KNEE ARTHROPLASTY Right 08/15/2016   Procedure: RIGHT TOTAL KNEE ARTHROPLASTY, REMOVAL ACL SCREWS;  Surgeon: Newt Minion, MD;  Location: [place];  Service: Orthopedics;  Laterality: Right;   TOTAL KNEE ARTHROPLASTY WITH HARDWARE REMOVAL Right    VAGINAL HYSTERECTOMY     tah/bso   VARICOSE VEIN SURGERY Right X 2   WEIL OSTEOTOMY Right 02/12/2020   Procedure: DOUBLE L OSTEOTOMY;  Surgeon: Felipa Furnace, DPM;  Location: [place];  Service: Podiatry;  Laterality: Right;   Social History   Occupational History   Occupation: Disability   Occupation: formerly Therapist, sports, [employer]  Tobacco Use   Smoking status: Former    Packs/day: 0.75    Years: 8.00    Total pack years: 6.00    Types: Cigarettes    Quit date: 12/01/1990    Years since quitting: 30.7    Passive exposure: Past   Smokeless tobacco: Never   Tobacco comments:    quit smoking in the 1990s  Vaping Use   Vaping Use: Never used  Substance and Sexual Activity   Alcohol use: No    Comment: 03/23/2015 "stopped drinking in 2012 w/gastric bypass;  drank socially before bypass"   Drug use: No   Sexual activity: Not Currently    Birth control/protection: Surgical

## 2021-08-15 NOTE — Pre-Procedure Instructions (Signed)
Surgical Instructions    Your procedure is scheduled on Monday 08/21/21.   Report to [facility] Main Entrance "A" at 05:30 A.M., then check in with the Admitting office.  Call this number if you have problems the morning of surgery:  [phone]   If you have any questions prior to your surgery date call [phone]: Open Monday-Friday 8am-4pm    Remember:  Do not eat after midnight the night before your surgery  You may drink clear liquids until 04:30 A.M. the morning of your surgery.   Clear liquids allowed are: Water, Non-Citrus Juices (without pulp), Carbonated Beverages, Clear Tea, Black Coffee ONLY (NO MILK, CREAM OR POWDERED CREAMER of any kind), and Gatorade    Take these medicines the morning of surgery with A SIP OF WATER:   baclofen (LIORESAL)  DULoxetine (CYMBALTA)  gabapentin (NEURONTIN)  Glycerin-Hypromellose-PEG Eye Drops  levothyroxine (SYNTHROID)  mirabegron ER (MYRBETRIQ)  omeprazole (PRILOSEC)  Tapentadol HCl (NUCYNTA)  topiramate (TOPAMAX)   trospium (SANCTURA)      Take these medicines if needed:   ondansetron (ZOFRAN-ODT)   oxyCODONE-acetaminophen (PERCOCET)  Rimegepant Sulfate (NURTEC)   tiZANidine (ZANAFLEX)  Please follow your surgeon's instructions regarding rivaroxaban (XARELTO). If you have not received instructions then please contact your surgeon's office for instructions.  As of today, STOP taking any Aspirin (unless otherwise instructed by your surgeon) Aleve, Naproxen, Ibuprofen, Motrin, Advil, Goody's, BC's, all herbal medications, fish oil, diclofenac sodium (VOLTAREN), and all vitamins.           Do not wear jewelry or makeup Do not wear lotions, powders, perfumes/colognes, or deodorant. Do not shave 48 hours prior to surgery.  Men may shave face and neck. Do not bring valuables to the hospital. Do not wear nail polish, gel polish, artificial nails, or any other type of covering on natural nails (fingers and toes) If you have  artificial nails or gel coating that need to be removed by a nail salon, please have this removed prior to surgery. Artificial nails or gel coating may interfere with anesthesia's ability to adequately monitor your vital signs.  [facility] is not responsible for any belongings or valuables. .   Do NOT Smoke (Tobacco/Vaping)  24 hours prior to your procedure  If you use a CPAP at night, you may bring your mask for your overnight stay.   Contacts, glasses, hearing aids, dentures or partials may not be worn into surgery, please bring cases for these belongings   For patients admitted to the hospital, discharge time will be determined by your treatment team.   Patients discharged the day of surgery will not be allowed to drive home, and someone needs to stay with them for 24 hours.   SURGICAL WAITING ROOM VISITATION Patients having surgery or a procedure may have no more than 2 support people in the waiting area - these visitors may rotate.   Children under the age of 48 must have an adult with them who is not the patient. If the patient needs to stay at the hospital during part of their recovery, the visitor guidelines for inpatient rooms apply. Pre-op nurse will coordinate an appropriate time for 1 support person to accompany patient in pre-op.  This support person may not rotate.   Please refer to the [facility] website for the visitor guidelines for Inpatients (after your surgery is over and you are in a regular room).    Special instructions:    Oral Hygiene is also important to reduce your risk of  infection.  Remember - BRUSH YOUR TEETH THE MORNING OF SURGERY WITH YOUR REGULAR TOOTHPASTE   [facility]- Preparing For Surgery  Before surgery, you can play an important role. Because skin is not sterile, your skin needs to be as free of germs as possible. You can reduce the number of germs on your skin by washing with CHG (chlorahexidine gluconate) Soap before surgery.  CHG is an  antiseptic cleaner which kills germs and bonds with the skin to continue killing germs even after washing.     Please do not use if you have an allergy to CHG or antibacterial soaps. If your skin becomes reddened/irritated stop using the CHG.  Do not shave (including legs and underarms) for at least 48 hours prior to first CHG shower. It is OK to shave your face.  Please follow these instructions carefully.     Shower the NIGHT BEFORE SURGERY and the MORNING OF SURGERY with CHG Soap.   If you chose to wash your hair, wash your hair first as usual with your normal shampoo. After you shampoo, rinse your hair and body thoroughly to remove the shampoo.  Then [employer] and genitals (private parts) with your normal soap and rinse thoroughly to remove soap.  After that Use CHG Soap as you would any other liquid soap. You can apply CHG directly to the skin and wash gently with a scrungie or a clean washcloth.   Apply the CHG Soap to your body ONLY FROM THE NECK DOWN.  Do not use on open wounds or open sores. Avoid contact with your eyes, ears, mouth and genitals (private parts). Wash Face and genitals (private parts)  with your normal soap.   Wash thoroughly, paying special attention to the area where your surgery will be performed.  Thoroughly rinse your body with warm water from the neck down.  DO NOT shower/wash with your normal soap after using and rinsing off the CHG Soap.  Pat yourself dry with a CLEAN TOWEL.  Wear CLEAN PAJAMAS to bed the night before surgery  Place CLEAN SHEETS on your bed the night before your surgery  DO NOT SLEEP WITH PETS.   Day of Surgery:  Take a shower with CHG soap. Wear Clean/Comfortable clothing the morning of surgery Do not apply any deodorants/lotions.   Remember to brush your teeth WITH YOUR REGULAR TOOTHPASTE.    If you received a COVID test during your pre-op visit, it is requested that you wear a mask when out in public, stay away from anyone  that may not be feeling well, and notify your surgeon if you develop symptoms. If you have been in contact with anyone that has tested positive in the last 10 days, please notify your surgeon.    Please read over the following fact sheets that you were given.

## 2021-08-16 ENCOUNTER — Encounter (HOSPITAL_COMMUNITY): Payer: Self-pay

## 2021-08-16 ENCOUNTER — Other Ambulatory Visit: Payer: Self-pay

## 2021-08-16 ENCOUNTER — Encounter (HOSPITAL_COMMUNITY)
Admission: RE | Admit: 2021-08-16 | Discharge: 2021-08-16 | Disposition: A | Discharge status: Home / Self Care | Payer: PPO | Source: Ambulatory Visit | Attending: Specialist | Admitting: Specialist

## 2021-08-16 VITALS — BP 103/76 | HR 79 | Temp 98.2°F | Resp 18 | Ht 64.0 in | Wt 257.8 lb

## 2021-08-16 DIAGNOSIS — G4733 Obstructive sleep apnea (adult) (pediatric): Secondary | ICD-10-CM | POA: Diagnosis not present

## 2021-08-16 DIAGNOSIS — Z7901 Long term (current) use of anticoagulants: Secondary | ICD-10-CM | POA: Diagnosis not present

## 2021-08-16 DIAGNOSIS — G43909 Migraine, unspecified, not intractable, without status migrainosus: Secondary | ICD-10-CM | POA: Diagnosis not present

## 2021-08-16 DIAGNOSIS — Z8673 Personal history of transient ischemic attack (TIA), and cerebral infarction without residual deficits: Secondary | ICD-10-CM | POA: Diagnosis not present

## 2021-08-16 DIAGNOSIS — R569 Unspecified convulsions: Secondary | ICD-10-CM | POA: Insufficient documentation

## 2021-08-16 DIAGNOSIS — E7212 Methylenetetrahydrofolate reductase deficiency: Secondary | ICD-10-CM | POA: Insufficient documentation

## 2021-08-16 DIAGNOSIS — Z01818 Encounter for other preprocedural examination: Secondary | ICD-10-CM | POA: Diagnosis not present

## 2021-08-16 DIAGNOSIS — K219 Gastro-esophageal reflux disease without esophagitis: Secondary | ICD-10-CM | POA: Insufficient documentation

## 2021-08-16 DIAGNOSIS — Z86718 Personal history of other venous thrombosis and embolism: Secondary | ICD-10-CM | POA: Insufficient documentation

## 2021-08-16 DIAGNOSIS — Z9884 Bariatric surgery status: Secondary | ICD-10-CM | POA: Diagnosis not present

## 2021-08-16 DIAGNOSIS — Z9989 Dependence on other enabling machines and devices: Secondary | ICD-10-CM | POA: Diagnosis not present

## 2021-08-16 LAB — BASIC METABOLIC PANEL
Anion gap: 5 (ref 5–15)
BUN: 13 mg/dL (ref 6–20)
CO2: 27 mmol/L (ref 22–32)
Calcium: 9.4 mg/dL (ref 8.9–10.3)
Chloride: 105 mmol/L (ref 98–111)
Creatinine, Ser: 1.14 mg/dL — ABNORMAL HIGH (ref 0.44–1.00)
GFR, Estimated: 56 mL/min — ABNORMAL LOW (ref >60)
Glucose, Bld: 95 mg/dL (ref 70–99)
Potassium: 3.8 mmol/L (ref 3.5–5.1)
Sodium: 137 mmol/L (ref 135–145)

## 2021-08-16 LAB — CBC
HCT: 39.9 % (ref 36.0–46.0)
Hemoglobin: 13.5 g/dL (ref 12.0–15.0)
MCH: 32.4 pg (ref 26.0–34.0)
MCHC: 33.8 g/dL (ref 30.0–36.0)
MCV: 95.7 fL (ref 80.0–100.0)
Platelets: 205 10*3/uL (ref 150–400)
RBC: 4.17 MIL/uL (ref 3.87–5.11)
RDW: 12.2 % (ref 11.5–15.5)
WBC: 7.1 10*3/uL (ref 4.0–10.5)
nRBC: 0 % (ref 0.0–0.2)

## 2021-08-16 LAB — GLUCOSE, CAPILLARY: Glucose-Capillary: 94 mg/dL (ref 70–99)

## 2021-08-16 LAB — SURGICAL PCR SCREEN
MRSA, PCR: NEGATIVE
Staphylococcus aureus: NEGATIVE

## 2021-08-16 NOTE — Progress Notes (Signed)
PCP - Delman Cheadle at [facility] - Dr. Jenkins Rouge  PPM/ICD - n/a Device Orders - n/a Rep Notified - n/a  Chest x-ray - n/a EKG - 08/16/21 Stress Test - 2012 ECHO - 05/02/17 Cardiac Cath - 2008  Sleep Study - +OSA. Wears CPAP nightly. Does not know pressure settings.  Pre-diabetes  Blood Thinner Instructions: Patient states she was instructed to stop taking Xarelto three days prior to surgery.  Aspirin Instructions: n/a  ERAS Protcol - Clear liquids until 0430 day of surgery.  PRE-SURGERY Ensure or G2- n/a  COVID TEST- n/a   Anesthesia review: Yes. Clearance note 08/04/21 in Epic from Dr. Johnsie Cancel and note in shadow chart from Dr. Billey Gosling.   Patient denies shortness of breath, fever, cough and chest pain at PAT appointment   All instructions explained to the patient, with a verbal understanding of the material. Patient agrees to go over the instructions while at home for a better understanding. The opportunity to ask questions was provided.

## 2021-08-17 NOTE — Progress Notes (Signed)
Anesthesia Chart Review:  Follows with cardiology for history of  factor V Leiden with CVA on chronic Xarelto, history of DVT, GERD, OSA on CPAP.  Seen by Nicholes Rough, PA-C 08/04/2021 via televisit for preop evaluation.  Per note, "Ms. Taffe's perioperative risk of a major cardiac event is 6.6% according to the Revised Cardiac Risk Index (RCRI).  Therefore, she is at high risk for perioperative complications.   Her functional capacity is fair at 4.4 METs according to the Duke Activity Status Index (DASI). Recommendations: According to ACC/AHA guidelines, no further cardiovascular testing needed.  The patient may proceed to surgery at acceptable risk.  Antiplatelet and/or Anticoagulation Recommendations: She states she was told to hold Xarelto 1 week prior to procedure. "  Follows with neurology for hx of migraines, MTHFR deficiency, and seizures.  Clearance dated 08/01/2021 states patient okay to hold Xarelto for 3 days prior to surgery and the patient is neurologically optimized for surgery.  At PAT visit, patient reported she was instructed to stop Xarelto 3 days prior to surgery.  History of Roux-en-Y gastric bypass.  Preop labs reviewed, unremarkable.  EKG 08/16/2021: NSR with unusual P axis, cannot exclude ectopic atrial rhythm.  Rate 73.  Echo complete bubble study 01/18/2020:  1. Left ventricular ejection fraction, by estimation, is 70 to 75%. The  left ventricle has hyperdynamic function. The left ventricle has no  regional wall motion abnormalities. Left ventricular diastolic parameters  were normal.   2. Evidence of atrial level shunting detected by color flow Doppler.  Agitated saline contrast bubble study was positive with shunting observed  within 3-6 cardiac cycles suggestive of interatrial shunt. There is a  moderately sized patent foramen ovale.   3. Right ventricular systolic function is normal. The right ventricular  size is normal.   4. The mitral valve is normal in  structure. Mild mitral valve  regurgitation.   5. The aortic valve is abnormal. Aortic valve regurgitation is not  visualized. Mild aortic valve sclerosis is present, with no evidence of  aortic valve stenosis.   6. The inferior vena cava is normal in size with greater than 50%  respiratory variability, suggesting right atrial pressure of 3 mmHg.   Comparison(s): The left ventricular function is unchanged.   Dr. Johnsie Cancel commented stating, "Has PFO but no events since being on anticoagulation no need to consider closure at this time"    Wynonia Musty [facility] Short Stay Center/Anesthesiology Phone [phone] 08/17/2021 11:54 AM

## 2021-08-17 NOTE — Anesthesia Preprocedure Evaluation (Addendum)
Anesthesia Evaluation  Patient identified by MRN, date of birth, ID band Patient awake    Reviewed: Allergy & Precautions, NPO status , Patient's Chart, lab work & pertinent test results  Airway Mallampati: II  TM Distance: >3 FB Neck ROM: Full    Dental  (+) Teeth Intact, Dental Advisory Given,    Pulmonary sleep apnea and Continuous Positive Airway Pressure Ventilation , former smoker,    Pulmonary exam normal breath sounds clear to auscultation       Cardiovascular + DVT  Normal cardiovascular exam+ Valvular Problems/Murmurs (PFO, mild MR) MR  Rhythm:Regular Rate:Normal  Echo complete bubble study 01/18/2020: 1. Left ventricular ejection fraction, by estimation, is 70 to 75%. The  left ventricle has hyperdynamic function. The left ventricle has no  regional wall motion abnormalities. Left ventricular diastolic parameters  were normal.  2. Evidence of atrial level shunting detected by color flow Doppler.  Agitated saline contrast bubble study was positive with shunting observed  within 3-6 cardiac cycles suggestive of interatrial shunt. There is a  moderately sized patent foramen ovale.  3. Right ventricular systolic function is normal. The right ventricular  size is normal.  4. The mitral valve is normal in structure. Mild mitral valve  regurgitation.  5. The aortic valve is abnormal. Aortic valve regurgitation is not  visualized. Mild aortic valve sclerosis is present, with no evidence of  aortic valve stenosis.  6. The inferior vena cava is normal in size with greater than 50%  respiratory variability, suggesting right atrial pressure of 3 mmHg.   Neuro/Psych  Headaches, PSYCHIATRIC DISORDERS Depression CVA (right foot weakness, vision problems), Residual Symptoms    GI/Hepatic Neg liver ROS, GERD  ,  Endo/Other  Hypothyroidism Morbid obesity (BMI 44)  Renal/GU negative Renal ROS  negative genitourinary    Musculoskeletal  (+) Arthritis , Fibromyalgia -, narcotic dependent  Abdominal   Peds  Hematology  (+) Blood dyscrasia (Factor V Leiden on xarelto), ,   Anesthesia Other Findings   Reproductive/Obstetrics                         Anesthesia Physical Anesthesia Plan  ASA: 3  Anesthesia Plan: General   Post-op Pain Management: Tylenol PO (pre-op)* and Ketamine IV*   Induction: Intravenous  PONV Risk Score and Plan: 3 and Midazolam, Dexamethasone and Ondansetron  Airway Management Planned: Oral ETT  Additional Equipment: Arterial line  Intra-op Plan:   Post-operative Plan: Extubation in OR  Informed Consent: I have reviewed the patients History and Physical, chart, labs and discussed the procedure including the risks, benefits and alternatives for the proposed anesthesia with the patient or authorized representative who has indicated his/her understanding and acceptance.     Dental advisory given  Plan Discussed with: CRNA  Anesthesia Plan Comments: (2 IVs)      Anesthesia Quick Evaluation

## 2021-08-18 ENCOUNTER — Ambulatory Visit (HOSPITAL_COMMUNITY)
Admission: RE | Admit: 2021-08-18 | Discharge: 2021-08-18 | Disposition: A | Discharge status: Home / Self Care | Payer: PPO | Source: Ambulatory Visit | Attending: Physical Medicine and Rehabilitation | Admitting: Physical Medicine and Rehabilitation

## 2021-08-18 ENCOUNTER — Other Ambulatory Visit (HOSPITAL_COMMUNITY): Payer: Self-pay | Admitting: Physical Medicine and Rehabilitation

## 2021-08-18 DIAGNOSIS — M4805 Spinal stenosis, thoracolumbar region: Secondary | ICD-10-CM | POA: Diagnosis not present

## 2021-08-18 DIAGNOSIS — M4726 Other spondylosis with radiculopathy, lumbar region: Secondary | ICD-10-CM | POA: Diagnosis not present

## 2021-08-18 DIAGNOSIS — M5459 Other low back pain: Secondary | ICD-10-CM | POA: Diagnosis not present

## 2021-08-18 DIAGNOSIS — G4700 Insomnia, unspecified: Secondary | ICD-10-CM | POA: Diagnosis not present

## 2021-08-18 DIAGNOSIS — M6281 Muscle weakness (generalized): Secondary | ICD-10-CM | POA: Diagnosis not present

## 2021-08-18 DIAGNOSIS — M8589 Other specified disorders of bone density and structure, multiple sites: Secondary | ICD-10-CM | POA: Diagnosis not present

## 2021-08-18 DIAGNOSIS — M159 Polyosteoarthritis, unspecified: Secondary | ICD-10-CM | POA: Diagnosis not present

## 2021-08-18 DIAGNOSIS — M4125 Other idiopathic scoliosis, thoracolumbar region: Secondary | ICD-10-CM | POA: Diagnosis not present

## 2021-08-18 DIAGNOSIS — Z743 Need for continuous supervision: Secondary | ICD-10-CM | POA: Diagnosis not present

## 2021-08-18 DIAGNOSIS — M5135 Other intervertebral disc degeneration, thoracolumbar region: Secondary | ICD-10-CM | POA: Diagnosis not present

## 2021-08-18 DIAGNOSIS — E063 Autoimmune thyroiditis: Secondary | ICD-10-CM | POA: Diagnosis not present

## 2021-08-18 DIAGNOSIS — K219 Gastro-esophageal reflux disease without esophagitis: Secondary | ICD-10-CM | POA: Diagnosis not present

## 2021-08-18 DIAGNOSIS — M5116 Intervertebral disc disorders with radiculopathy, lumbar region: Secondary | ICD-10-CM | POA: Diagnosis not present

## 2021-08-18 DIAGNOSIS — G894 Chronic pain syndrome: Secondary | ICD-10-CM | POA: Diagnosis not present

## 2021-08-18 DIAGNOSIS — M19071 Primary osteoarthritis, right ankle and foot: Secondary | ICD-10-CM | POA: Diagnosis not present

## 2021-08-18 DIAGNOSIS — E782 Mixed hyperlipidemia: Secondary | ICD-10-CM | POA: Diagnosis not present

## 2021-08-18 DIAGNOSIS — M48062 Spinal stenosis, lumbar region with neurogenic claudication: Secondary | ICD-10-CM | POA: Diagnosis not present

## 2021-08-18 DIAGNOSIS — I679 Cerebrovascular disease, unspecified: Secondary | ICD-10-CM | POA: Diagnosis not present

## 2021-08-18 DIAGNOSIS — M4326 Fusion of spine, lumbar region: Secondary | ICD-10-CM | POA: Diagnosis not present

## 2021-08-18 DIAGNOSIS — M4724 Other spondylosis with radiculopathy, thoracic region: Secondary | ICD-10-CM | POA: Diagnosis not present

## 2021-08-18 DIAGNOSIS — Z803 Family history of malignant neoplasm of breast: Secondary | ICD-10-CM | POA: Diagnosis not present

## 2021-08-18 DIAGNOSIS — F1120 Opioid dependence, uncomplicated: Secondary | ICD-10-CM | POA: Diagnosis not present

## 2021-08-18 DIAGNOSIS — M5117 Intervertebral disc disorders with radiculopathy, lumbosacral region: Secondary | ICD-10-CM | POA: Diagnosis not present

## 2021-08-18 DIAGNOSIS — M47817 Spondylosis without myelopathy or radiculopathy, lumbosacral region: Secondary | ICD-10-CM | POA: Diagnosis not present

## 2021-08-18 DIAGNOSIS — F902 Attention-deficit hyperactivity disorder, combined type: Secondary | ICD-10-CM | POA: Diagnosis not present

## 2021-08-18 DIAGNOSIS — M5115 Intervertebral disc disorders with radiculopathy, thoracolumbar region: Secondary | ICD-10-CM | POA: Diagnosis not present

## 2021-08-18 DIAGNOSIS — M7731 Calcaneal spur, right foot: Secondary | ICD-10-CM | POA: Diagnosis not present

## 2021-08-18 DIAGNOSIS — M19041 Primary osteoarthritis, right hand: Secondary | ICD-10-CM | POA: Diagnosis not present

## 2021-08-18 DIAGNOSIS — M5416 Radiculopathy, lumbar region: Secondary | ICD-10-CM | POA: Diagnosis not present

## 2021-08-18 DIAGNOSIS — T85112S Breakdown (mechanical) of implanted electronic neurostimulator (electrode) of spinal cord, sequela: Secondary | ICD-10-CM | POA: Diagnosis not present

## 2021-08-18 DIAGNOSIS — M4807 Spinal stenosis, lumbosacral region: Secondary | ICD-10-CM | POA: Diagnosis not present

## 2021-08-18 DIAGNOSIS — Z820 Family history of epilepsy and other diseases of the nervous system: Secondary | ICD-10-CM | POA: Diagnosis not present

## 2021-08-18 DIAGNOSIS — M797 Fibromyalgia: Secondary | ICD-10-CM | POA: Diagnosis not present

## 2021-08-18 DIAGNOSIS — M5114 Intervertebral disc disorders with radiculopathy, thoracic region: Secondary | ICD-10-CM | POA: Diagnosis not present

## 2021-08-18 DIAGNOSIS — R262 Difficulty in walking, not elsewhere classified: Secondary | ICD-10-CM | POA: Diagnosis not present

## 2021-08-18 DIAGNOSIS — M4185 Other forms of scoliosis, thoracolumbar region: Secondary | ICD-10-CM | POA: Diagnosis not present

## 2021-08-18 DIAGNOSIS — M8580 Other specified disorders of bone density and structure, unspecified site: Secondary | ICD-10-CM | POA: Diagnosis not present

## 2021-08-18 DIAGNOSIS — D62 Acute posthemorrhagic anemia: Secondary | ICD-10-CM | POA: Diagnosis not present

## 2021-08-18 DIAGNOSIS — M4316 Spondylolisthesis, lumbar region: Secondary | ICD-10-CM | POA: Diagnosis not present

## 2021-08-18 DIAGNOSIS — Z981 Arthrodesis status: Secondary | ICD-10-CM | POA: Diagnosis not present

## 2021-08-18 DIAGNOSIS — Z7901 Long term (current) use of anticoagulants: Secondary | ICD-10-CM | POA: Diagnosis not present

## 2021-08-18 DIAGNOSIS — Q2112 Patent foramen ovale: Secondary | ICD-10-CM | POA: Diagnosis not present

## 2021-08-18 DIAGNOSIS — D509 Iron deficiency anemia, unspecified: Secondary | ICD-10-CM | POA: Diagnosis not present

## 2021-08-18 DIAGNOSIS — Z9989 Dependence on other enabling machines and devices: Secondary | ICD-10-CM | POA: Diagnosis not present

## 2021-08-18 DIAGNOSIS — M4804 Spinal stenosis, thoracic region: Secondary | ICD-10-CM | POA: Diagnosis not present

## 2021-08-18 DIAGNOSIS — M4725 Other spondylosis with radiculopathy, thoracolumbar region: Secondary | ICD-10-CM | POA: Diagnosis not present

## 2021-08-18 DIAGNOSIS — M19072 Primary osteoarthritis, left ankle and foot: Secondary | ICD-10-CM | POA: Diagnosis not present

## 2021-08-18 DIAGNOSIS — R278 Other lack of coordination: Secondary | ICD-10-CM | POA: Diagnosis not present

## 2021-08-18 DIAGNOSIS — Z4789 Encounter for other orthopedic aftercare: Secondary | ICD-10-CM | POA: Diagnosis not present

## 2021-08-18 DIAGNOSIS — Z741 Need for assistance with personal care: Secondary | ICD-10-CM | POA: Diagnosis not present

## 2021-08-18 DIAGNOSIS — M549 Dorsalgia, unspecified: Secondary | ICD-10-CM | POA: Diagnosis not present

## 2021-08-18 DIAGNOSIS — M419 Scoliosis, unspecified: Secondary | ICD-10-CM | POA: Diagnosis not present

## 2021-08-18 DIAGNOSIS — Z6841 Body Mass Index (BMI) 40.0 and over, adult: Secondary | ICD-10-CM | POA: Diagnosis not present

## 2021-08-18 DIAGNOSIS — D6851 Activated protein C resistance: Secondary | ICD-10-CM | POA: Diagnosis not present

## 2021-08-18 DIAGNOSIS — M79671 Pain in right foot: Secondary | ICD-10-CM | POA: Insufficient documentation

## 2021-08-18 DIAGNOSIS — R531 Weakness: Secondary | ICD-10-CM | POA: Diagnosis not present

## 2021-08-18 DIAGNOSIS — M5136 Other intervertebral disc degeneration, lumbar region: Secondary | ICD-10-CM | POA: Diagnosis not present

## 2021-08-18 DIAGNOSIS — M4156 Other secondary scoliosis, lumbar region: Secondary | ICD-10-CM | POA: Diagnosis not present

## 2021-08-18 DIAGNOSIS — I872 Venous insufficiency (chronic) (peripheral): Secondary | ICD-10-CM | POA: Diagnosis not present

## 2021-08-18 DIAGNOSIS — G4733 Obstructive sleep apnea (adult) (pediatric): Secondary | ICD-10-CM | POA: Diagnosis not present

## 2021-08-18 DIAGNOSIS — E876 Hypokalemia: Secondary | ICD-10-CM | POA: Diagnosis not present

## 2021-08-18 DIAGNOSIS — E039 Hypothyroidism, unspecified: Secondary | ICD-10-CM | POA: Diagnosis not present

## 2021-08-20 DIAGNOSIS — M5136 Other intervertebral disc degeneration, lumbar region: Secondary | ICD-10-CM

## 2021-08-20 DIAGNOSIS — M4156 Other secondary scoliosis, lumbar region: Secondary | ICD-10-CM | POA: Diagnosis not present

## 2021-08-20 DIAGNOSIS — T85112S Breakdown (mechanical) of implanted electronic neurostimulator (electrode) of spinal cord, sequela: Secondary | ICD-10-CM | POA: Diagnosis not present

## 2021-08-20 DIAGNOSIS — M4724 Other spondylosis with radiculopathy, thoracic region: Secondary | ICD-10-CM

## 2021-08-20 DIAGNOSIS — M5416 Radiculopathy, lumbar region: Secondary | ICD-10-CM

## 2021-08-20 DIAGNOSIS — M4726 Other spondylosis with radiculopathy, lumbar region: Secondary | ICD-10-CM | POA: Diagnosis not present

## 2021-08-20 DIAGNOSIS — Z981 Arthrodesis status: Secondary | ICD-10-CM | POA: Diagnosis not present

## 2021-08-20 DIAGNOSIS — M4316 Spondylolisthesis, lumbar region: Secondary | ICD-10-CM

## 2021-08-21 ENCOUNTER — Other Ambulatory Visit: Payer: Self-pay

## 2021-08-21 ENCOUNTER — Encounter: Payer: PPO | Admitting: Nutrition

## 2021-08-21 ENCOUNTER — Inpatient Hospital Stay (HOSPITAL_COMMUNITY): Payer: PPO | Admitting: Physician Assistant

## 2021-08-21 ENCOUNTER — Inpatient Hospital Stay (HOSPITAL_COMMUNITY): Payer: PPO

## 2021-08-21 ENCOUNTER — Inpatient Hospital Stay (HOSPITAL_COMMUNITY): Payer: PPO | Admitting: Registered Nurse

## 2021-08-21 ENCOUNTER — Inpatient Hospital Stay (HOSPITAL_COMMUNITY)
Admission: RE | Admit: 2021-08-21 | Discharge: 2021-08-27 | DRG: 454 | Disposition: A | Discharge status: Skilled Nursing Facility | Payer: PPO | Attending: Orthopaedic Surgery | Admitting: Orthopaedic Surgery

## 2021-08-21 ENCOUNTER — Encounter (HOSPITAL_COMMUNITY): Payer: Self-pay | Admitting: Specialist

## 2021-08-21 ENCOUNTER — Inpatient Hospital Stay (HOSPITAL_COMMUNITY)
Admission: RE | Disposition: A | Discharge status: Skilled Nursing Facility | Payer: Self-pay | Source: Home / Self Care | Attending: Specialist

## 2021-08-21 DIAGNOSIS — Z9049 Acquired absence of other specified parts of digestive tract: Secondary | ICD-10-CM

## 2021-08-21 DIAGNOSIS — M419 Scoliosis, unspecified: Secondary | ICD-10-CM | POA: Diagnosis not present

## 2021-08-21 DIAGNOSIS — Z743 Need for continuous supervision: Secondary | ICD-10-CM | POA: Diagnosis not present

## 2021-08-21 DIAGNOSIS — T85112S Breakdown (mechanical) of implanted electronic neurostimulator (electrode) of spinal cord, sequela: Secondary | ICD-10-CM | POA: Diagnosis not present

## 2021-08-21 DIAGNOSIS — Z833 Family history of diabetes mellitus: Secondary | ICD-10-CM

## 2021-08-21 DIAGNOSIS — Z8249 Family history of ischemic heart disease and other diseases of the circulatory system: Secondary | ICD-10-CM

## 2021-08-21 DIAGNOSIS — F1120 Opioid dependence, uncomplicated: Secondary | ICD-10-CM | POA: Diagnosis not present

## 2021-08-21 DIAGNOSIS — M4316 Spondylolisthesis, lumbar region: Secondary | ICD-10-CM

## 2021-08-21 DIAGNOSIS — Z6841 Body Mass Index (BMI) 40.0 and over, adult: Secondary | ICD-10-CM

## 2021-08-21 DIAGNOSIS — M19071 Primary osteoarthritis, right ankle and foot: Secondary | ICD-10-CM | POA: Diagnosis present

## 2021-08-21 DIAGNOSIS — D62 Acute posthemorrhagic anemia: Secondary | ICD-10-CM | POA: Diagnosis not present

## 2021-08-21 DIAGNOSIS — M4805 Spinal stenosis, thoracolumbar region: Secondary | ICD-10-CM | POA: Diagnosis present

## 2021-08-21 DIAGNOSIS — Z741 Need for assistance with personal care: Secondary | ICD-10-CM | POA: Diagnosis not present

## 2021-08-21 DIAGNOSIS — Z4789 Encounter for other orthopedic aftercare: Secondary | ICD-10-CM | POA: Diagnosis not present

## 2021-08-21 DIAGNOSIS — M8580 Other specified disorders of bone density and structure, unspecified site: Secondary | ICD-10-CM | POA: Diagnosis present

## 2021-08-21 DIAGNOSIS — M4326 Fusion of spine, lumbar region: Secondary | ICD-10-CM | POA: Diagnosis not present

## 2021-08-21 DIAGNOSIS — M797 Fibromyalgia: Secondary | ICD-10-CM | POA: Diagnosis not present

## 2021-08-21 DIAGNOSIS — D509 Iron deficiency anemia, unspecified: Secondary | ICD-10-CM | POA: Diagnosis not present

## 2021-08-21 DIAGNOSIS — Z87891 Personal history of nicotine dependence: Secondary | ICD-10-CM

## 2021-08-21 DIAGNOSIS — R262 Difficulty in walking, not elsewhere classified: Secondary | ICD-10-CM | POA: Diagnosis not present

## 2021-08-21 DIAGNOSIS — E039 Hypothyroidism, unspecified: Secondary | ICD-10-CM | POA: Diagnosis present

## 2021-08-21 DIAGNOSIS — R278 Other lack of coordination: Secondary | ICD-10-CM | POA: Diagnosis not present

## 2021-08-21 DIAGNOSIS — Z981 Arthrodesis status: Secondary | ICD-10-CM | POA: Diagnosis not present

## 2021-08-21 DIAGNOSIS — M159 Polyosteoarthritis, unspecified: Secondary | ICD-10-CM | POA: Diagnosis not present

## 2021-08-21 DIAGNOSIS — M5117 Intervertebral disc disorders with radiculopathy, lumbosacral region: Secondary | ICD-10-CM | POA: Diagnosis not present

## 2021-08-21 DIAGNOSIS — M5135 Other intervertebral disc degeneration, thoracolumbar region: Secondary | ICD-10-CM | POA: Diagnosis not present

## 2021-08-21 DIAGNOSIS — M4724 Other spondylosis with radiculopathy, thoracic region: Secondary | ICD-10-CM | POA: Diagnosis not present

## 2021-08-21 DIAGNOSIS — Z820 Family history of epilepsy and other diseases of the nervous system: Secondary | ICD-10-CM | POA: Diagnosis not present

## 2021-08-21 DIAGNOSIS — Z803 Family history of malignant neoplasm of breast: Secondary | ICD-10-CM

## 2021-08-21 DIAGNOSIS — M2428 Disorder of ligament, vertebrae: Secondary | ICD-10-CM | POA: Diagnosis present

## 2021-08-21 DIAGNOSIS — Z96651 Presence of right artificial knee joint: Secondary | ICD-10-CM | POA: Diagnosis present

## 2021-08-21 DIAGNOSIS — M4807 Spinal stenosis, lumbosacral region: Secondary | ICD-10-CM

## 2021-08-21 DIAGNOSIS — Z7901 Long term (current) use of anticoagulants: Secondary | ICD-10-CM | POA: Diagnosis not present

## 2021-08-21 DIAGNOSIS — K5900 Constipation, unspecified: Secondary | ICD-10-CM | POA: Diagnosis present

## 2021-08-21 DIAGNOSIS — D6851 Activated protein C resistance: Secondary | ICD-10-CM | POA: Diagnosis present

## 2021-08-21 DIAGNOSIS — M5114 Intervertebral disc disorders with radiculopathy, thoracic region: Principal | ICD-10-CM | POA: Diagnosis present

## 2021-08-21 DIAGNOSIS — G4733 Obstructive sleep apnea (adult) (pediatric): Secondary | ICD-10-CM | POA: Diagnosis not present

## 2021-08-21 DIAGNOSIS — E063 Autoimmune thyroiditis: Secondary | ICD-10-CM | POA: Diagnosis not present

## 2021-08-21 DIAGNOSIS — M4125 Other idiopathic scoliosis, thoracolumbar region: Secondary | ICD-10-CM | POA: Diagnosis not present

## 2021-08-21 DIAGNOSIS — M19041 Primary osteoarthritis, right hand: Secondary | ICD-10-CM | POA: Diagnosis present

## 2021-08-21 DIAGNOSIS — Q2112 Patent foramen ovale: Secondary | ICD-10-CM | POA: Diagnosis not present

## 2021-08-21 DIAGNOSIS — F909 Attention-deficit hyperactivity disorder, unspecified type: Secondary | ICD-10-CM | POA: Diagnosis present

## 2021-08-21 DIAGNOSIS — M8589 Other specified disorders of bone density and structure, multiple sites: Secondary | ICD-10-CM | POA: Diagnosis not present

## 2021-08-21 DIAGNOSIS — R7303 Prediabetes: Secondary | ICD-10-CM | POA: Diagnosis present

## 2021-08-21 DIAGNOSIS — M4804 Spinal stenosis, thoracic region: Secondary | ICD-10-CM | POA: Diagnosis not present

## 2021-08-21 DIAGNOSIS — M5459 Other low back pain: Secondary | ICD-10-CM | POA: Diagnosis not present

## 2021-08-21 DIAGNOSIS — K219 Gastro-esophageal reflux disease without esophagitis: Secondary | ICD-10-CM | POA: Diagnosis not present

## 2021-08-21 DIAGNOSIS — F902 Attention-deficit hyperactivity disorder, combined type: Secondary | ICD-10-CM | POA: Diagnosis not present

## 2021-08-21 DIAGNOSIS — M5136 Other intervertebral disc degeneration, lumbar region: Secondary | ICD-10-CM

## 2021-08-21 DIAGNOSIS — G894 Chronic pain syndrome: Secondary | ICD-10-CM | POA: Diagnosis not present

## 2021-08-21 DIAGNOSIS — I679 Cerebrovascular disease, unspecified: Secondary | ICD-10-CM | POA: Diagnosis not present

## 2021-08-21 DIAGNOSIS — M5416 Radiculopathy, lumbar region: Secondary | ICD-10-CM | POA: Diagnosis not present

## 2021-08-21 DIAGNOSIS — Z86718 Personal history of other venous thrombosis and embolism: Secondary | ICD-10-CM

## 2021-08-21 DIAGNOSIS — Z823 Family history of stroke: Secondary | ICD-10-CM

## 2021-08-21 DIAGNOSIS — R531 Weakness: Secondary | ICD-10-CM | POA: Diagnosis not present

## 2021-08-21 DIAGNOSIS — M4725 Other spondylosis with radiculopathy, thoracolumbar region: Secondary | ICD-10-CM | POA: Diagnosis present

## 2021-08-21 DIAGNOSIS — Z90710 Acquired absence of both cervix and uterus: Secondary | ICD-10-CM

## 2021-08-21 DIAGNOSIS — M4185 Other forms of scoliosis, thoracolumbar region: Secondary | ICD-10-CM | POA: Diagnosis not present

## 2021-08-21 DIAGNOSIS — M4156 Other secondary scoliosis, lumbar region: Secondary | ICD-10-CM

## 2021-08-21 DIAGNOSIS — M5115 Intervertebral disc disorders with radiculopathy, thoracolumbar region: Secondary | ICD-10-CM | POA: Diagnosis not present

## 2021-08-21 DIAGNOSIS — M19072 Primary osteoarthritis, left ankle and foot: Secondary | ICD-10-CM | POA: Diagnosis present

## 2021-08-21 DIAGNOSIS — M5116 Intervertebral disc disorders with radiculopathy, lumbar region: Secondary | ICD-10-CM | POA: Diagnosis present

## 2021-08-21 DIAGNOSIS — G4700 Insomnia, unspecified: Secondary | ICD-10-CM | POA: Diagnosis not present

## 2021-08-21 DIAGNOSIS — M6281 Muscle weakness (generalized): Secondary | ICD-10-CM | POA: Diagnosis not present

## 2021-08-21 DIAGNOSIS — I872 Venous insufficiency (chronic) (peripheral): Secondary | ICD-10-CM | POA: Diagnosis not present

## 2021-08-21 DIAGNOSIS — M47817 Spondylosis without myelopathy or radiculopathy, lumbosacral region: Secondary | ICD-10-CM | POA: Diagnosis not present

## 2021-08-21 DIAGNOSIS — Z8673 Personal history of transient ischemic attack (TIA), and cerebral infarction without residual deficits: Secondary | ICD-10-CM

## 2021-08-21 DIAGNOSIS — M51369 Other intervertebral disc degeneration, lumbar region without mention of lumbar back pain or lower extremity pain: Secondary | ICD-10-CM

## 2021-08-21 DIAGNOSIS — M549 Dorsalgia, unspecified: Secondary | ICD-10-CM | POA: Diagnosis not present

## 2021-08-21 DIAGNOSIS — M4726 Other spondylosis with radiculopathy, lumbar region: Secondary | ICD-10-CM

## 2021-08-21 DIAGNOSIS — M48062 Spinal stenosis, lumbar region with neurogenic claudication: Secondary | ICD-10-CM | POA: Diagnosis not present

## 2021-08-21 DIAGNOSIS — Z9989 Dependence on other enabling machines and devices: Secondary | ICD-10-CM | POA: Diagnosis not present

## 2021-08-21 DIAGNOSIS — Z8672 Personal history of thrombophlebitis: Secondary | ICD-10-CM

## 2021-08-21 DIAGNOSIS — E876 Hypokalemia: Secondary | ICD-10-CM | POA: Diagnosis not present

## 2021-08-21 DIAGNOSIS — M7731 Calcaneal spur, right foot: Secondary | ICD-10-CM | POA: Diagnosis not present

## 2021-08-21 DIAGNOSIS — Z9884 Bariatric surgery status: Secondary | ICD-10-CM

## 2021-08-21 DIAGNOSIS — E782 Mixed hyperlipidemia: Secondary | ICD-10-CM | POA: Diagnosis not present

## 2021-08-21 LAB — POCT I-STAT 7, (LYTES, BLD GAS, ICA,H+H)
Acid-base deficit: 4 mmol/L — ABNORMAL HIGH (ref 0.0–2.0)
Acid-base deficit: 4 mmol/L — ABNORMAL HIGH (ref 0.0–2.0)
Acid-base deficit: 5 mmol/L — ABNORMAL HIGH (ref 0.0–2.0)
Acid-base deficit: 6 mmol/L — ABNORMAL HIGH (ref 0.0–2.0)
Bicarbonate: 23.3 mmol/L (ref 20.0–28.0)
Bicarbonate: 21.6 mmol/L (ref 20.0–28.0)
Bicarbonate: 20.8 mmol/L (ref 20.0–28.0)
Bicarbonate: 20 mmol/L (ref 20.0–28.0)
Calcium, Ion: 1.24 mmol/L (ref 1.15–1.40)
Calcium, Ion: 1.22 mmol/L (ref 1.15–1.40)
Calcium, Ion: 1.2 mmol/L (ref 1.15–1.40)
Calcium, Ion: 1.2 mmol/L (ref 1.15–1.40)
HCT: 31 % — ABNORMAL LOW (ref 36.0–46.0)
HCT: 32 % — ABNORMAL LOW (ref 36.0–46.0)
HCT: 32 % — ABNORMAL LOW (ref 36.0–46.0)
HCT: 35 % — ABNORMAL LOW (ref 36.0–46.0)
Hemoglobin: 10.5 g/dL — ABNORMAL LOW (ref 12.0–15.0)
Hemoglobin: 10.9 g/dL — ABNORMAL LOW (ref 12.0–15.0)
Hemoglobin: 10.9 g/dL — ABNORMAL LOW (ref 12.0–15.0)
Hemoglobin: 11.9 g/dL — ABNORMAL LOW (ref 12.0–15.0)
O2 Saturation: 99 %
O2 Saturation: 100 %
O2 Saturation: 100 %
O2 Saturation: 98 %
Patient temperature: 35.1
Patient temperature: 36
Patient temperature: 36.2
Potassium: 3.7 mmol/L (ref 3.5–5.1)
Potassium: 3.7 mmol/L (ref 3.5–5.1)
Potassium: 3.9 mmol/L (ref 3.5–5.1)
Potassium: 4.4 mmol/L (ref 3.5–5.1)
Sodium: 136 mmol/L (ref 135–145)
Sodium: 139 mmol/L (ref 135–145)
Sodium: 140 mmol/L (ref 135–145)
Sodium: 141 mmol/L (ref 135–145)
TCO2: 25 mmol/L (ref 22–32)
TCO2: 23 mmol/L (ref 22–32)
TCO2: 22 mmol/L (ref 22–32)
TCO2: 21 mmol/L — ABNORMAL LOW (ref 22–32)
pCO2 arterial: 47.9 mmHg (ref 32–48)
pCO2 arterial: 40 mmHg (ref 32–48)
pCO2 arterial: 40.2 mmHg (ref 32–48)
pCO2 arterial: 42.5 mmHg (ref 32–48)
pH, Arterial: 7.285 — ABNORMAL LOW (ref 7.35–7.45)
pH, Arterial: 7.335 — ABNORMAL LOW (ref 7.35–7.45)
pH, Arterial: 7.318 — ABNORMAL LOW (ref 7.35–7.45)
pH, Arterial: 7.282 — ABNORMAL LOW (ref 7.35–7.45)
pO2, Arterial: 163 mmHg — ABNORMAL HIGH (ref 83–108)
pO2, Arterial: 197 mmHg — ABNORMAL HIGH (ref 83–108)
pO2, Arterial: 239 mmHg — ABNORMAL HIGH (ref 83–108)
pO2, Arterial: 117 mmHg — ABNORMAL HIGH (ref 83–108)

## 2021-08-21 LAB — ABO/RH: ABO/RH(D): B POS

## 2021-08-21 LAB — GLUCOSE, CAPILLARY: Glucose-Capillary: 124 mg/dL — ABNORMAL HIGH (ref 70–99)

## 2021-08-21 SURGERY — POSTERIOR LUMBAR FUSION 3 LEVEL
Anesthesia: General | Site: Back

## 2021-08-21 MED ORDER — PROPOFOL 1000 MG/100ML IV EMUL
INTRAVENOUS | Status: AC
  Filled 2021-08-21: qty 100

## 2021-08-21 MED ORDER — PROPOFOL 500 MG/50ML IV EMUL
INTRAVENOUS | Status: DC | PRN
  Administered 2021-08-21: 80 ug/kg/min via INTRAVENOUS

## 2021-08-21 MED ORDER — ONDANSETRON 4 MG PO TBDP: 4.0000 mg | ORAL_TABLET | Freq: Three times a day (TID) | ORAL | Status: DC | PRN

## 2021-08-21 MED ORDER — PROPOFOL 10 MG/ML IV BOLUS
INTRAVENOUS | Status: AC
  Filled 2021-08-21: qty 20

## 2021-08-21 MED ORDER — TOPIRAMATE 100 MG PO TABS
100.0000 mg | ORAL_TABLET | Freq: Two times a day (BID) | ORAL | Status: DC
  Administered 2021-08-22 – 2021-08-27 (×10): 100 mg via ORAL
  Filled 2021-08-21 (×13): qty 1

## 2021-08-21 MED ORDER — LACTATED RINGERS IV SOLN: INTRAVENOUS | Status: DC

## 2021-08-21 MED ORDER — DOCUSATE SODIUM 100 MG PO CAPS
100.0000 mg | ORAL_CAPSULE | Freq: Two times a day (BID) | ORAL | Status: DC
  Administered 2021-08-22 – 2021-08-27 (×9): 100 mg via ORAL
  Filled 2021-08-21 (×10): qty 1

## 2021-08-21 MED ORDER — VITAMIN D 25 MCG (1000 UNIT) PO TABS
5000.0000 [IU] | ORAL_TABLET | Freq: Every day | ORAL | Status: DC
  Administered 2021-08-23 – 2021-08-27 (×5): 5000 [IU] via ORAL
  Filled 2021-08-21 (×6): qty 5

## 2021-08-21 MED ORDER — RIMEGEPANT SULFATE 75 MG PO TBDP: 75.0000 mg | ORAL_TABLET | Freq: Every day | ORAL | Status: DC | PRN

## 2021-08-21 MED ORDER — GABAPENTIN 800 MG PO TABS: 800.0000 mg | ORAL_TABLET | Freq: Four times a day (QID) | ORAL | Status: DC

## 2021-08-21 MED ORDER — OXYCODONE HCL ER 10 MG PO T12A
20.0000 mg | EXTENDED_RELEASE_TABLET | Freq: Two times a day (BID) | ORAL | Status: DC
  Administered 2021-08-22 – 2021-08-27 (×11): 20 mg via ORAL
  Filled 2021-08-21 (×12): qty 2

## 2021-08-21 MED ORDER — HYDROCORTISONE 1 % EX CREA: 1.0000 | TOPICAL_CREAM | Freq: Every day | CUTANEOUS | Status: DC | PRN

## 2021-08-21 MED ORDER — SODIUM CHLORIDE 0.9 % IV SOLN
INTRAVENOUS | Status: DC
Start: 2021-08-22 — End: 2021-08-23

## 2021-08-21 MED ORDER — VANCOMYCIN HCL 1000 MG IV SOLR
INTRAVENOUS | Status: DC | PRN
  Administered 2021-08-21: 1000 mg via TOPICAL

## 2021-08-21 MED ORDER — ACETAMINOPHEN 500 MG PO TABS
1000.0000 mg | ORAL_TABLET | Freq: Once | ORAL | Status: AC
  Administered 2021-08-21: 1000 mg via ORAL
  Filled 2021-08-21: qty 2

## 2021-08-21 MED ORDER — SODIUM CHLORIDE 0.9 % IV SOLN: 0.1500 ug/kg/min | INTRAVENOUS | Status: DC

## 2021-08-21 MED ORDER — KETAMINE HCL 50 MG/5ML IJ SOSY
PREFILLED_SYRINGE | INTRAMUSCULAR | Status: AC
  Filled 2021-08-21: qty 5

## 2021-08-21 MED ORDER — MIDAZOLAM HCL 2 MG/2ML IJ SOLN
INTRAMUSCULAR | Status: DC | PRN
  Administered 2021-08-21: 2 mg via INTRAVENOUS

## 2021-08-21 MED ORDER — FLEET ENEMA 7-19 GM/118ML RE ENEM: 1.0000 | ENEMA | Freq: Once | RECTAL | Status: DC | PRN

## 2021-08-21 MED ORDER — PROPOFOL 10 MG/ML IV BOLUS
INTRAVENOUS | Status: DC | PRN
  Administered 2021-08-21: 60 mg via INTRAVENOUS
  Administered 2021-08-21: 140 mg via INTRAVENOUS

## 2021-08-21 MED ORDER — METHOCARBAMOL 500 MG PO TABS
500.0000 mg | ORAL_TABLET | Freq: Four times a day (QID) | ORAL | Status: DC | PRN
  Administered 2021-08-22 – 2021-08-27 (×13): 500 mg via ORAL
  Filled 2021-08-21 (×13): qty 1

## 2021-08-21 MED ORDER — CEFAZOLIN SODIUM-DEXTROSE 2-4 GM/100ML-% IV SOLN
2.0000 g | Freq: Three times a day (TID) | INTRAVENOUS | Status: AC
  Administered 2021-08-22 (×2): 2 g via INTRAVENOUS
  Filled 2021-08-21 (×2): qty 100

## 2021-08-21 MED ORDER — ASCORBIC ACID 500 MG PO TABS
500.0000 mg | ORAL_TABLET | Freq: Every day | ORAL | Status: DC
  Administered 2021-08-23 – 2021-08-27 (×5): 500 mg via ORAL
  Filled 2021-08-21 (×6): qty 1

## 2021-08-21 MED ORDER — FOLIC ACID 1 MG PO TABS
500.0000 ug | ORAL_TABLET | Freq: Every day | ORAL | Status: DC
Start: 2021-08-22 — End: 2021-08-27
  Administered 2021-08-23 – 2021-08-27 (×5): 0.5 mg via ORAL
  Filled 2021-08-21 (×6): qty 1

## 2021-08-21 MED ORDER — DIPHENHYDRAMINE-ZINC ACETATE 2-0.1 % EX CREA: 1.0000 | TOPICAL_CREAM | Freq: Every day | CUTANEOUS | Status: DC | PRN

## 2021-08-21 MED ORDER — DULOXETINE HCL 60 MG PO CPEP
60.0000 mg | ORAL_CAPSULE | Freq: Two times a day (BID) | ORAL | Status: DC
  Administered 2021-08-22 – 2021-08-27 (×10): 60 mg via ORAL
  Filled 2021-08-21 (×11): qty 1

## 2021-08-21 MED ORDER — MIRABEGRON ER 50 MG PO TB24
50.0000 mg | ORAL_TABLET | Freq: Every day | ORAL | Status: DC
  Administered 2021-08-23 – 2021-08-27 (×5): 50 mg via ORAL
  Filled 2021-08-21 (×6): qty 1

## 2021-08-21 MED ORDER — FERROUS SULFATE 325 (65 FE) MG PO TABS
325.0000 mg | ORAL_TABLET | Freq: Every day | ORAL | Status: DC
  Administered 2021-08-23 – 2021-08-24 (×2): 325 mg via ORAL
  Filled 2021-08-21 (×2): qty 1

## 2021-08-21 MED ORDER — ACETAMINOPHEN 650 MG RE SUPP: 650.0000 mg | RECTAL | Status: DC | PRN

## 2021-08-21 MED ORDER — HEPARIN 30,000 UNITS/1000 ML (OHS) CELLSAVER SOLUTION
Status: AC
Start: 2021-08-21 — End: 2021-08-22
  Filled 2021-08-21: qty 1000

## 2021-08-21 MED ORDER — FAMOTIDINE 20 MG PO TABS
40.0000 mg | ORAL_TABLET | Freq: Every day | ORAL | Status: DC
  Administered 2021-08-22 – 2021-08-26 (×6): 40 mg via ORAL
  Filled 2021-08-21 (×6): qty 2

## 2021-08-21 MED ORDER — MENTHOL 3 MG MT LOZG: 1.0000 | LOZENGE | OROMUCOSAL | Status: DC | PRN

## 2021-08-21 MED ORDER — THROMBIN 20000 UNITS EX SOLR: CUTANEOUS | Status: DC | PRN

## 2021-08-21 MED ORDER — BUPIVACAINE LIPOSOME 1.3 % IJ SUSP
INTRAMUSCULAR | Status: DC | PRN
  Administered 2021-08-21: 20 mL

## 2021-08-21 MED ORDER — LACTATED RINGERS IV SOLN: INTRAVENOUS | Status: DC | PRN

## 2021-08-21 MED ORDER — SODIUM CHLORIDE 0.9 % IV SOLN
250.0000 mL | INTRAVENOUS | Status: DC
Start: 2021-08-22 — End: 2021-08-27

## 2021-08-21 MED ORDER — SODIUM BICARBONATE 8.4 % IV SOLN
INTRAVENOUS | Status: DC | PRN
  Administered 2021-08-21: 25 mL via INTRAVENOUS

## 2021-08-21 MED ORDER — GALCANEZUMAB-GNLM 120 MG/ML ~~LOC~~ SOSY: 120.0000 mg | PREFILLED_SYRINGE | SUBCUTANEOUS | Status: DC

## 2021-08-21 MED ORDER — NALOXONE HCL 4 MG/0.1ML NA LIQD: 0.4000 mg | Freq: Once | NASAL | Status: DC

## 2021-08-21 MED ORDER — METHYLPHENIDATE HCL ER (OSM) 27 MG PO TBCR
27.0000 mg | EXTENDED_RELEASE_TABLET | Freq: Two times a day (BID) | ORAL | Status: DC
  Administered 2021-08-23 – 2021-08-27 (×10): 27 mg via ORAL
  Filled 2021-08-21 (×10): qty 1

## 2021-08-21 MED ORDER — TRANEXAMIC ACID 1000 MG/10ML IV SOLN
INTRAVENOUS | Status: DC | PRN
  Administered 2021-08-21: 2000 mg via TOPICAL

## 2021-08-21 MED ORDER — OXYCODONE HCL 5 MG PO TABS
10.0000 mg | ORAL_TABLET | ORAL | Status: DC | PRN
  Administered 2021-08-22 – 2021-08-27 (×27): 10 mg via ORAL
  Filled 2021-08-21 (×27): qty 2

## 2021-08-21 MED ORDER — FENTANYL CITRATE (PF) 250 MCG/5ML IJ SOLN
INTRAMUSCULAR | Status: AC
  Filled 2021-08-21: qty 5

## 2021-08-21 MED ORDER — CHLORHEXIDINE GLUCONATE 0.12 % MT SOLN
15.0000 mL | Freq: Once | OROMUCOSAL | Status: AC
  Administered 2021-08-21: 15 mL via OROMUCOSAL
  Filled 2021-08-21: qty 15

## 2021-08-21 MED ORDER — L-METHYLFOLATE-B6-B12 3-35-2 MG PO TABS
1.0000 | ORAL_TABLET | Freq: Every day | ORAL | Status: DC
  Administered 2021-08-22 – 2021-08-26 (×5): 1 via ORAL
  Filled 2021-08-21 (×7): qty 1

## 2021-08-21 MED ORDER — FENTANYL CITRATE (PF) 100 MCG/2ML IJ SOLN
25.0000 ug | INTRAMUSCULAR | Status: DC | PRN
  Administered 2021-08-21 (×2): 50 ug via INTRAVENOUS

## 2021-08-21 MED ORDER — FENTANYL CITRATE (PF) 250 MCG/5ML IJ SOLN
INTRAMUSCULAR | Status: DC | PRN
  Administered 2021-08-21 (×3): 50 ug via INTRAVENOUS

## 2021-08-21 MED ORDER — BUPIVACAINE LIPOSOME 1.3 % IJ SUSP
10.0000 mL | Freq: Once | INTRAMUSCULAR | Status: DC
  Filled 2021-08-21: qty 10

## 2021-08-21 MED ORDER — ONDANSETRON HCL 4 MG/2ML IJ SOLN
4.0000 mg | Freq: Four times a day (QID) | INTRAMUSCULAR | Status: DC | PRN
  Administered 2021-08-22 – 2021-08-25 (×2): 4 mg via INTRAVENOUS
  Filled 2021-08-21 (×2): qty 2

## 2021-08-21 MED ORDER — SODIUM CHLORIDE 0.9 % IV SOLN
0.1500 ug/kg/min | INTRAVENOUS | Status: AC
  Filled 2021-08-21: qty 5000

## 2021-08-21 MED ORDER — PROPOFOL 1000 MG/100ML IV EMUL
INTRAVENOUS | Status: AC
  Filled 2021-08-21: qty 500

## 2021-08-21 MED ORDER — DONEPEZIL HCL 10 MG PO TABS
10.0000 mg | ORAL_TABLET | Freq: Every day | ORAL | Status: DC
  Administered 2021-08-22 – 2021-08-26 (×6): 10 mg via ORAL
  Filled 2021-08-21 (×6): qty 1

## 2021-08-21 MED ORDER — FESOTERODINE FUMARATE ER 4 MG PO TB24
4.0000 mg | ORAL_TABLET | Freq: Every day | ORAL | Status: DC
  Administered 2021-08-23 – 2021-08-27 (×5): 4 mg via ORAL
  Filled 2021-08-21 (×6): qty 1

## 2021-08-21 MED ORDER — LEVOTHYROXINE SODIUM 25 MCG PO TABS
125.0000 ug | ORAL_TABLET | Freq: Every day | ORAL | Status: DC
  Administered 2021-08-22 – 2021-08-27 (×6): 125 ug via ORAL
  Filled 2021-08-21 (×6): qty 1

## 2021-08-21 MED ORDER — BISACODYL 5 MG PO TBEC
5.0000 mg | DELAYED_RELEASE_TABLET | Freq: Every day | ORAL | Status: DC | PRN
  Administered 2021-08-25: 5 mg via ORAL
  Filled 2021-08-21: qty 1

## 2021-08-21 MED ORDER — MIDAZOLAM HCL 2 MG/2ML IJ SOLN
INTRAMUSCULAR | Status: AC
  Filled 2021-08-21: qty 2

## 2021-08-21 MED ORDER — PHENYLEPHRINE 80 MCG/ML (10ML) SYRINGE FOR IV PUSH (FOR BLOOD PRESSURE SUPPORT)
PREFILLED_SYRINGE | INTRAVENOUS | Status: DC | PRN
  Administered 2021-08-21: 160 ug via INTRAVENOUS

## 2021-08-21 MED ORDER — ALBUMIN HUMAN 5 % IV SOLN: INTRAVENOUS | Status: DC | PRN

## 2021-08-21 MED ORDER — HYDROMORPHONE HCL 1 MG/ML IJ SOLN
INTRAMUSCULAR | Status: AC
  Filled 2021-08-21: qty 1

## 2021-08-21 MED ORDER — SCOPOLAMINE 1 MG/3DAYS TD PT72
MEDICATED_PATCH | TRANSDERMAL | Status: DC | PRN
  Administered 2021-08-21: 1 via TRANSDERMAL

## 2021-08-21 MED ORDER — POLYETHYLENE GLYCOL 3350 17 G PO PACK: 17.0000 g | PACK | Freq: Every day | ORAL | Status: DC | PRN

## 2021-08-21 MED ORDER — ORAL CARE MOUTH RINSE: 15.0000 mL | Freq: Once | OROMUCOSAL | Status: AC

## 2021-08-21 MED ORDER — LIDOCAINE 2% (20 MG/ML) 5 ML SYRINGE
INTRAMUSCULAR | Status: DC | PRN
  Administered 2021-08-21: 100 mg via INTRAVENOUS

## 2021-08-21 MED ORDER — ONDANSETRON HCL 4 MG PO TABS
4.0000 mg | ORAL_TABLET | Freq: Four times a day (QID) | ORAL | Status: DC | PRN
  Administered 2021-08-23 – 2021-08-27 (×5): 4 mg via ORAL
  Filled 2021-08-21 (×5): qty 1

## 2021-08-21 MED ORDER — ADULT MULTIVITAMIN W/MINERALS CH
3.0000 | ORAL_TABLET | Freq: Every day | ORAL | Status: DC
  Administered 2021-08-23 – 2021-08-27 (×5): 3 via ORAL
  Filled 2021-08-21 (×6): qty 3

## 2021-08-21 MED ORDER — VANCOMYCIN HCL 1000 MG IV SOLR
INTRAVENOUS | Status: AC
  Filled 2021-08-21: qty 20

## 2021-08-21 MED ORDER — DEXAMETHASONE SODIUM PHOSPHATE 10 MG/ML IJ SOLN
INTRAMUSCULAR | Status: DC | PRN
  Administered 2021-08-21: 4 mg via INTRAVENOUS

## 2021-08-21 MED ORDER — THROMBIN 20000 UNITS EX KIT
PACK | CUTANEOUS | Status: AC
  Filled 2021-08-21: qty 1

## 2021-08-21 MED ORDER — SODIUM CHLORIDE 0.9% FLUSH
3.0000 mL | Freq: Two times a day (BID) | INTRAVENOUS | Status: DC
  Administered 2021-08-22 – 2021-08-27 (×9): 3 mL via INTRAVENOUS

## 2021-08-21 MED ORDER — OXYCODONE HCL 5 MG PO TABS
5.0000 mg | ORAL_TABLET | ORAL | Status: DC | PRN
  Administered 2021-08-22: 5 mg via ORAL
  Filled 2021-08-21: qty 1

## 2021-08-21 MED ORDER — SODIUM CHLORIDE 0.9 % IV SOLN: INTRAVENOUS | Status: DC | PRN

## 2021-08-21 MED ORDER — POLYVINYL ALCOHOL 1.4 % OP SOLN
1.0000 [drp] | Freq: Two times a day (BID) | OPHTHALMIC | Status: DC
  Administered 2021-08-22 – 2021-08-27 (×10): 1 [drp] via OPHTHALMIC
  Filled 2021-08-21: qty 15

## 2021-08-21 MED ORDER — METHOCARBAMOL 1000 MG/10ML IJ SOLN: 500.0000 mg | Freq: Four times a day (QID) | INTRAVENOUS | Status: DC | PRN

## 2021-08-21 MED ORDER — TIZANIDINE HCL 4 MG PO TABS: 4.0000 mg | ORAL_TABLET | Freq: Four times a day (QID) | ORAL | Status: DC | PRN

## 2021-08-21 MED ORDER — 0.9 % SODIUM CHLORIDE (POUR BTL) OPTIME
TOPICAL | Status: DC | PRN
  Administered 2021-08-21 (×2): 1000 mL

## 2021-08-21 MED ORDER — ACETAMINOPHEN 325 MG PO TABS: 650.0000 mg | ORAL_TABLET | ORAL | Status: DC | PRN

## 2021-08-21 MED ORDER — SODIUM CHLORIDE 0.9 % IV SOLN
0.1500 ug/kg/min | INTRAVENOUS | Status: AC
  Administered 2021-08-21: .1 ug/kg/min via INTRAVENOUS
  Filled 2021-08-21: qty 2000

## 2021-08-21 MED ORDER — SODIUM CHLORIDE 0.9% FLUSH: 3.0000 mL | INTRAVENOUS | Status: DC | PRN

## 2021-08-21 MED ORDER — TRAZODONE HCL 50 MG PO TABS
50.0000 mg | ORAL_TABLET | Freq: Every day | ORAL | Status: DC
  Administered 2021-08-22 – 2021-08-26 (×6): 50 mg via ORAL
  Filled 2021-08-21 (×6): qty 1

## 2021-08-21 MED ORDER — PANTOPRAZOLE SODIUM 40 MG PO TBEC
80.0000 mg | DELAYED_RELEASE_TABLET | Freq: Every day | ORAL | Status: DC
  Administered 2021-08-23 – 2021-08-27 (×5): 80 mg via ORAL
  Filled 2021-08-21 (×6): qty 2

## 2021-08-21 MED ORDER — LUBIPROSTONE 24 MCG PO CAPS
24.0000 ug | ORAL_CAPSULE | Freq: Two times a day (BID) | ORAL | Status: DC
  Administered 2021-08-23 – 2021-08-27 (×10): 24 ug via ORAL
  Filled 2021-08-21 (×11): qty 1

## 2021-08-21 MED ORDER — VITAMIN B-12 1000 MCG PO TABS
5000.0000 ug | ORAL_TABLET | Freq: Every day | ORAL | Status: DC
  Administered 2021-08-23 – 2021-08-27 (×5): 5000 ug via ORAL
  Filled 2021-08-21 (×6): qty 5

## 2021-08-21 MED ORDER — ALUM & MAG HYDROXIDE-SIMETH 200-200-20 MG/5ML PO SUSP: 30.0000 mL | Freq: Four times a day (QID) | ORAL | Status: DC | PRN

## 2021-08-21 MED ORDER — BUPIVACAINE HCL (PF) 0.5 % IJ SOLN
INTRAMUSCULAR | Status: AC
  Filled 2021-08-21: qty 30

## 2021-08-21 MED ORDER — PHENOL 1.4 % MT LIQD: 1.0000 | OROMUCOSAL | Status: DC | PRN

## 2021-08-21 MED ORDER — SUCCINYLCHOLINE CHLORIDE 200 MG/10ML IV SOSY
PREFILLED_SYRINGE | INTRAVENOUS | Status: DC | PRN
  Administered 2021-08-21: 200 mg via INTRAVENOUS

## 2021-08-21 MED ORDER — HYDROMORPHONE HCL 1 MG/ML IJ SOLN: 0.5000 mg | INTRAMUSCULAR | Status: DC | PRN

## 2021-08-21 MED ORDER — TRIPLE ANTIBIOTIC 3.5-400-5000 EX OINT: 1.0000 | TOPICAL_OINTMENT | Freq: Every day | CUTANEOUS | Status: DC | PRN

## 2021-08-21 MED ORDER — ROCURONIUM BROMIDE 10 MG/ML (PF) SYRINGE
PREFILLED_SYRINGE | INTRAVENOUS | Status: DC | PRN
  Administered 2021-08-21: 10 mg via INTRAVENOUS

## 2021-08-21 MED ORDER — OMEGA-3-ACID ETHYL ESTERS 1 G PO CAPS
1000.0000 mg | ORAL_CAPSULE | Freq: Every day | ORAL | Status: DC
  Administered 2021-08-22 – 2021-08-26 (×5): 1000 mg via ORAL
  Filled 2021-08-21 (×5): qty 1

## 2021-08-21 MED ORDER — TRANEXAMIC ACID 1000 MG/10ML IV SOLN
2000.0000 mg | Freq: Once | INTRAVENOUS | Status: DC
  Filled 2021-08-21: qty 20

## 2021-08-21 MED ORDER — SODIUM CHLORIDE 0.9 % IV SOLN
0.1500 ug/kg/min | INTRAVENOUS | Status: DC
  Filled 2021-08-21 (×4): qty 2000

## 2021-08-21 MED ORDER — BACLOFEN 10 MG PO TABS
10.0000 mg | ORAL_TABLET | Freq: Three times a day (TID) | ORAL | Status: DC
  Administered 2021-08-22 – 2021-08-27 (×17): 10 mg via ORAL
  Filled 2021-08-21 (×18): qty 1

## 2021-08-21 MED ORDER — PHENYLEPHRINE HCL-NACL 20-0.9 MG/250ML-% IV SOLN
INTRAVENOUS | Status: DC | PRN
  Administered 2021-08-21: 20 ug/min via INTRAVENOUS

## 2021-08-21 MED ORDER — KETAMINE HCL 10 MG/ML IJ SOLN
INTRAMUSCULAR | Status: DC | PRN
  Administered 2021-08-21: 20 mg via INTRAVENOUS
  Administered 2021-08-21 (×7): 10 mg via INTRAVENOUS

## 2021-08-21 MED ORDER — GABAPENTIN 300 MG PO CAPS
300.0000 mg | ORAL_CAPSULE | Freq: Three times a day (TID) | ORAL | Status: DC
  Administered 2021-08-22 – 2021-08-27 (×17): 300 mg via ORAL
  Filled 2021-08-21 (×18): qty 1

## 2021-08-21 MED ORDER — FENTANYL CITRATE (PF) 100 MCG/2ML IJ SOLN
INTRAMUSCULAR | Status: AC
  Filled 2021-08-21: qty 2

## 2021-08-21 MED ORDER — CEFAZOLIN SODIUM-DEXTROSE 2-4 GM/100ML-% IV SOLN
2.0000 g | INTRAVENOUS | Status: AC
  Administered 2021-08-21 (×3): 2 g via INTRAVENOUS
  Filled 2021-08-21: qty 100

## 2021-08-21 MED ORDER — HYDROMORPHONE HCL 1 MG/ML IJ SOLN
0.2500 mg | INTRAMUSCULAR | Status: DC | PRN
  Administered 2021-08-21 (×4): 0.5 mg via INTRAVENOUS

## 2021-08-21 MED ORDER — BUPIVACAINE LIPOSOME 1.3 % IJ SUSP
INTRAMUSCULAR | Status: AC
  Filled 2021-08-21: qty 20

## 2021-08-21 MED ORDER — BUPIVACAINE HCL 0.5 % IJ SOLN
INTRAMUSCULAR | Status: DC | PRN
  Administered 2021-08-21: 20 mL

## 2021-08-21 SURGICAL SUPPLY — 105 items
ADH SKN CLS APL DERMABOND .7 (GAUZE/BANDAGES/DRESSINGS) ×1
AGENT HMST KT MTR STRL THRMB (HEMOSTASIS)
BAG COUNTER SPONGE SURGICOUNT (BAG) ×2 IMPLANT
BAG SPNG CNTER NS LX DISP (BAG) ×1
BLADE BONE MILL MEDIUM (MISCELLANEOUS) ×1 IMPLANT
BLADE CLIPPER SURG (BLADE) IMPLANT
BLADE SURG 10 STRL SS (BLADE) ×2 IMPLANT
BONE CANC CHIPS 40CC CAN1/2 (Bone Implant) ×2 IMPLANT
BUR MATCHSTICK NEURO 3.0 LAGG (BURR) ×2 IMPLANT
BUR NEURO DRILL SOFT 3.0X3.8M (BURR) IMPLANT
BUR PRESCISION 1.7 ELITE (BURR) IMPLANT
BUR RND FLUTED 2.5 (BURR) ×1 IMPLANT
CAGE SABLE 10X26 6-12 8D (Cage) ×3 IMPLANT
CAGE SABLE 10X30 7-14 15D (Cage) ×1 IMPLANT
CANNULA GRAFT BNE VG PRE-FILL (Bone Implant) IMPLANT
CAP LOCKING THREADED (Cap) ×18 IMPLANT
CHIPS CANC BONE 40CC CAN1/2 (Bone Implant) ×1 IMPLANT
COVER BACK TABLE 80X110 HD (DRAPES) ×2 IMPLANT
COVER MAYO STAND STRL (DRAPES) ×1 IMPLANT
COVER SURGICAL LIGHT HANDLE (MISCELLANEOUS) ×2 IMPLANT
DERMABOND ADVANCED (GAUZE/BANDAGES/DRESSINGS) ×1
DERMABOND ADVANCED .7 DNX12 (GAUZE/BANDAGES/DRESSINGS) ×1 IMPLANT
DRAPE C-ARM 42X72 X-RAY (DRAPES) ×2 IMPLANT
DRAPE C-ARMOR (DRAPES) ×2 IMPLANT
DRAPE MICROSCOPE LEICA (MISCELLANEOUS) ×2 IMPLANT
DRAPE POUCH INSTRU U-SHP 10X18 (DRAPES) ×2 IMPLANT
DRAPE SURG 17X23 STRL (DRAPES) ×4 IMPLANT
DRAPE U-SHAPE 47X51 STRL (DRAPES) ×2 IMPLANT
DRESSING MEPILEX FLEX 4X4 (GAUZE/BANDAGES/DRESSINGS) IMPLANT
DRSG MEPILEX BORDER 4X12 (GAUZE/BANDAGES/DRESSINGS) ×1 IMPLANT
DRSG MEPILEX BORDER 4X8 (GAUZE/BANDAGES/DRESSINGS) ×1 IMPLANT
DRSG MEPILEX FLEX 4X4 (GAUZE/BANDAGES/DRESSINGS) ×2
DURAPREP 26ML APPLICATOR (WOUND CARE) ×3 IMPLANT
ELECT BLADE 4.0 EZ CLEAN MEGAD (MISCELLANEOUS) ×2
ELECT BLADE 6.5 EXT (BLADE) IMPLANT
ELECT CAUTERY BLADE 6.4 (BLADE) ×2 IMPLANT
ELECT REM PT RETURN 9FT ADLT (ELECTROSURGICAL) ×2
ELECTRODE BLDE 4.0 EZ CLN MEGD (MISCELLANEOUS) ×1 IMPLANT
ELECTRODE REM PT RTRN 9FT ADLT (ELECTROSURGICAL) ×1 IMPLANT
EVACUATOR 1/8 PVC DRAIN (DRAIN) ×1 IMPLANT
FEE INTRAOP CADWELL SUPPLY NCS (MISCELLANEOUS) IMPLANT
FEE INTRAOP MONITOR IMPULS NCS (MISCELLANEOUS) IMPLANT
GLOVE BIOGEL PI IND STRL 8 (GLOVE) ×1 IMPLANT
GLOVE BIOGEL PI INDICATOR 8 (GLOVE) ×1
GLOVE ECLIPSE 9.0 STRL (GLOVE) ×2 IMPLANT
GLOVE ORTHO TXT STRL SZ7.5 (GLOVE) ×2 IMPLANT
GLOVE SURG 8.5 LATEX PF (GLOVE) ×2 IMPLANT
GOWN STRL REUS W/ TWL LRG LVL3 (GOWN DISPOSABLE) ×1 IMPLANT
GOWN STRL REUS W/TWL 2XL LVL3 (GOWN DISPOSABLE) ×4 IMPLANT
GOWN STRL REUS W/TWL LRG LVL3 (GOWN DISPOSABLE) ×2
GRAFT BNE CHIP CANC 1-8 40 (Bone Implant) IMPLANT
GRAFT BONE CANNULA VIVIGEN 3 (Bone Implant) ×26 IMPLANT
IMPL CROSSLINK 32-40 (Neuro Prosthesis/Implant) IMPLANT
IMPLANT CROSSLINK 32-40 (Neuro Prosthesis/Implant) ×2 IMPLANT
INTERBODY SABLE 10X26 7-14 15D (Miscellaneous) ×1 IMPLANT
INTRAOP CADWELL SUPPLY FEE NCS (MISCELLANEOUS) ×1
INTRAOP DISP SUPPLY FEE NCS (MISCELLANEOUS) ×2
INTRAOP MONITOR FEE IMPULS NCS (MISCELLANEOUS) ×1
INTRAOP MONITOR FEE IMPULSE (MISCELLANEOUS) ×2
KIT BASIN OR (CUSTOM PROCEDURE TRAY) ×2 IMPLANT
KIT PEDICLE ACCESS (KITS) ×1 IMPLANT
KIT POSITION SURG JACKSON T1 (MISCELLANEOUS) ×2 IMPLANT
KIT TURNOVER KIT B (KITS) ×2 IMPLANT
NDL SPNL 18GX3.5 QUINCKE PK (NEEDLE) ×1 IMPLANT
NEEDLE 22X1 1/2 (OR ONLY) (NEEDLE) ×2 IMPLANT
NEEDLE SPNL 18GX3.5 QUINCKE PK (NEEDLE) ×2 IMPLANT
NS IRRIG 1000ML POUR BTL (IV SOLUTION) ×2 IMPLANT
PACK LAMINECTOMY ORTHO (CUSTOM PROCEDURE TRAY) ×2 IMPLANT
PAD ARMBOARD 7.5X6 YLW CONV (MISCELLANEOUS) ×5 IMPLANT
PATTIES SURGICAL .75X.75 (GAUZE/BANDAGES/DRESSINGS) ×1 IMPLANT
PATTIES SURGICAL 1X1 (DISPOSABLE) ×1 IMPLANT
PUSHER GRAFT GLBU (ORTHOPEDIC DISPOSABLE SUPPLIES) ×2 IMPLANT
ROD CREO CROSS SPINAL 24.5-31 (Rod) ×1 IMPLANT
ROD CREO MOD 6.5X50 (Rod) ×6 IMPLANT
ROD HEXENDED ALLOY 5.5X500MM (Rod) ×2 IMPLANT
SCREW MOD CREO 5.5X40 (Screw) ×2 IMPLANT
SCREW MOD SD CREO 7.5X40 (Screw) ×2 IMPLANT
SCREW MOD SD CREO 7.5X50 (Screw) ×2 IMPLANT
SCREW PA THRD CREO TULIP 5.5X4 (Head) ×18 IMPLANT
SCREW SD MOD CREO 6.5X40 (Screw) ×2 IMPLANT
SCREW SD MOD CREO 6.5X45 (Screw) ×4 IMPLANT
SPONGE SURGIFOAM ABS GEL 100 (HEMOSTASIS) ×2 IMPLANT
SPONGE T-LAP 4X18 ~~LOC~~+RFID (SPONGE) ×4 IMPLANT
STAPLER VISISTAT 35W (STAPLE) ×2 IMPLANT
STYLET INTUB SATIN SLIP 14FR (MISCELLANEOUS) ×2 IMPLANT
SURGIFLO W/THROMBIN 8M KIT (HEMOSTASIS) ×1 IMPLANT
SUT VIC AB 0 CT1 27 (SUTURE) ×6
SUT VIC AB 0 CT1 27XBRD ANBCTR (SUTURE) ×1 IMPLANT
SUT VIC AB 1 CTX 36 (SUTURE) ×6
SUT VIC AB 1 CTX36XBRD ANBCTR (SUTURE) ×1 IMPLANT
SUT VIC AB 2-0 CT1 27 (SUTURE) ×8
SUT VIC AB 2-0 CT1 TAPERPNT 27 (SUTURE) ×1 IMPLANT
SUT VIC AB 3-0 X1 27 (SUTURE) ×2 IMPLANT
SYR 20ML LL LF (SYRINGE) ×2 IMPLANT
SYR CONTROL 10ML LL (SYRINGE) ×4 IMPLANT
TAP 5.5 (TAP) ×1 IMPLANT
TAP CANN 4.5 GLBU (TAP) ×1 IMPLANT
TAP CANN 6.5 GL DISP (ORTHOPEDIC DISPOSABLE SUPPLIES) ×1 IMPLANT
TAP CANN 7.5 GL DISP (ORTHOPEDIC DISPOSABLE SUPPLIES) ×1 IMPLANT
TEMPLATE ROD GRIP END GL 350 (ORTHOPEDIC DISPOSABLE SUPPLIES) ×1 IMPLANT
TOWEL GREEN STERILE FF (TOWEL DISPOSABLE) ×2 IMPLANT
TRAY FOLEY MTR SLVR 16FR STAT (SET/KITS/TRAYS/PACK) ×2 IMPLANT
TUBE FUNNEL GL DISP (ORTHOPEDIC DISPOSABLE SUPPLIES) ×2 IMPLANT
WATER STERILE IRR 1000ML POUR (IV SOLUTION) ×2 IMPLANT
YANKAUER SUCT BULB TIP NO VENT (SUCTIONS) ×2 IMPLANT

## 2021-08-21 NOTE — Transfer of Care (Signed)
Immediate Anesthesia Transfer of Care Note  Patient: Debbie Pineda  Procedure(s) Performed: Removal of the spinal cord stimulator and thoracolumbar fusion T10 to S1 with Transforaminal Lumbar Interbody Fusions left L1-2, L2-3, L3-4 and L4-5, bilateral L5-S1 foramenotomies and left T10-11 foramenotomy this would be done with rods, screws and cages with local bone graft and allograft, Vivigen (Back)  Patient Location: PACU  Anesthesia Type:General  Level of Consciousness: drowsy and responds to stimulation  Airway & Oxygen Therapy: Patient Spontanous Breathing and Patient connected to face mask oxygen  Post-op Assessment: Report given to RN, Post -op Vital signs reviewed and stable and Patient moving all extremities X 4  Post vital signs: Reviewed and stable  Last Vitals:  Vitals Value Taken Time  BP 134/93 08/21/21 1850  Temp    Pulse 97 08/21/21 1851  Resp 26 08/21/21 1851  SpO2 100 % 08/21/21 1851  Vitals shown include unvalidated device data.  Last Pain:  Vitals:   08/21/21 0617  TempSrc:   PainSc: 3       Patients Stated Pain Goal: 1 (09/05/46 1856)  Complications: No notable events documented.

## 2021-08-21 NOTE — Discharge Instructions (Addendum)

## 2021-08-21 NOTE — Brief Op Note (Addendum)
08/21/2021  7:03 PM  PATIENT:  Debbie Pineda  61 y.o. female  PRE-OPERATIVE DIAGNOSIS:  collapsing lumbar scoliosis T10 - L5 with degenerative disc disease, spondylosis, stenosis, failure of spinal cord stimulator, lumbar neurogenic claudication L5-S1  POST-OPERATIVE DIAGNOSIS:  collapsing lumbar scoliosis T10 - L5 with degenerative disc disease, spondylosis, stenosis, failure of spinal cord stimulator, lumbar neurogenic claudication L5-S1  PROCEDURE:  Procedure(s): Removal of the spinal cord stimulator and thoracolumbar fusion T10 to S1 with Transforaminal Lumbar Interbody Fusions left L1-2, L2-3, L3-4 and L4-5, bilateral L5-S1 foramenotomies and left T10-11 foramenotomy this would be done with rods, screws and cages with local bone graft and allograft, Vivigen (N/A)  SURGEON:  Surgeon(s) and Role:    * Jessy Oto, MD - Primary  PHYSICIAN ASSISTANT: Benjiman Core, PA-C  ANESTHESIA:   local and general,Dr Gifford Shave.  EBL:  1150 mL   BLOOD ADMINISTERED: 1000 CC CELLSAVER  DRAINS: (1 medium) Hemovact drain(s) in the right lower lumbar with  Suction Clamped and Urinary Catheter (Foley)   LOCAL MEDICATIONS USED:  MARCAINE0.5% 1:1 EXPAREL 1.3% Amount: 20 ml  SPECIMEN:  No Specimen  DISPOSITION OF SPECIMEN:  N/A  COUNTS:  YES  TOURNIQUET:  * No tourniquets in log *  DICTATION: .Dragon Dictation  PLAN OF CARE: Admit to inpatient   PATIENT DISPOSITION:  PACU - hemodynamically stable.   Delay start of Pharmacological VTE agent (>24hrs) due to surgical blood loss or risk of bleeding: yes

## 2021-08-21 NOTE — Anesthesia Procedure Notes (Signed)
Procedure Name: Intubation Date/Time: 08/21/2021 8:10 AM  Performed by: Reeves Dam, CRNAPre-anesthesia Checklist: Patient identified, Emergency Drugs available, Suction available and Patient being monitored Patient Re-evaluated:Patient Re-evaluated prior to induction Oxygen Delivery Method: Circle system utilized Preoxygenation: Pre-oxygenation with 100% oxygen Induction Type: IV induction Ventilation: Mask ventilation without difficulty Laryngoscope Size: Miller and 3 Grade View: Grade I Tube type: Oral Tube size: 7.5 mm Number of attempts: 1 Airway Equipment and Method: Stylet and Oral airway Placement Confirmation: ETT inserted through vocal cords under direct vision, positive ETCO2 and breath sounds checked- equal and bilateral Secured at: 21 cm Tube secured with: Tape Dental Injury: Teeth and Oropharynx as per pre-operative assessment

## 2021-08-21 NOTE — Interval H&P Note (Signed)
History and Physical Interval Note:  08/21/2021 7:48 AM  Debbie Pineda  has presented today for surgery, with the diagnosis of collapsing lumbar scoliosis T10 - L5 with degenerative disc disease, spondylosis, stenosis, failure of spinal cord stimulator, lumbar neurogenic claudication L5-S1.  The various methods of treatment have been discussed with the patient and family. After consideration of risks, benefits and other options for treatment, the patient has consented to  Procedure(s): Removal of the spinal cord stimulator and thoracolumbar fusion T10 to S1 with Transforaminal Lumbar Interbody Fusions left L1-2, L2-3, L3-4 and L4-5, bilateral L5-S1 foramenotomies and left T10-11 foramenotomy this would be done with rods, screws and cages with local bone graft and allograft, Vivigen (N/A) as a surgical intervention.  The patient's history has been reviewed, patient examined, no change in status, stable for surgery.  I have reviewed the patient's chart and labs.  Questions were answered to the patient's satisfaction.     [name]

## 2021-08-21 NOTE — Anesthesia Procedure Notes (Signed)
Arterial Line Insertion Start/End7/31/2023 7:10 AM Performed by: Reeves Dam, CRNA, CRNA  Patient location: Pre-op. Preanesthetic checklist: patient identified, IV checked, site marked, risks and benefits discussed, surgical consent, monitors and equipment checked, pre-op evaluation, timeout performed and anesthesia consent Lidocaine 1% used for infiltration Right, radial was placed Catheter size: 20 G Hand hygiene performed , maximum sterile barriers used  and Seldinger technique used Allen's test indicative of satisfactory collateral circulation Attempts: 1 Procedure performed without using ultrasound guided technique. Following insertion, dressing applied and Biopatch. Post procedure assessment: normal  Patient tolerated the procedure well with no immediate complications.

## 2021-08-21 NOTE — H&P (Signed)
Office Visit Note              Patient: Debbie Pineda                                     Date of Birth: 02-15-1960                                                      MRN: [id number] Visit Date: 08/10/2021                                                                     Requested by: Jake Samples, PA-C [address] [place],  [place] [address] PCP: Jake Samples, PA-C     Assessment & Plan: Visit Diagnoses:  1. Lumbar radiculopathy   2. Spinal stenosis of thoracolumbar region       Plan: We will proceed Removal of the spinal cord stimulator and thoracolumbar fusion T10 to S1 with Transforaminal Lumbar Interbody Fusions left L1-2, L2-3, L3-4 and L4-5, bilateral L5-S1 foramenotomies and left T10-11 foramenotomy this would be done with rods, screws and cages with local bone graft and allograft, Vivigen as scheduled.  Surgical procedure discussed along with potential recovery time.  Patient is planning on doing her rehab at [facility] postop.  Hold Xarelto 3 days preop as instructed by neurology.  All questions answered. Follow-Up Instructions: No follow-ups on file.    Orders:  No orders of the defined types were placed in this encounter.   No orders of the defined types were placed in this encounter.        Procedures: No procedures performed     Clinical Data: No additional findings.     Subjective:     Chief Complaint  Patient presents with   Lower Back - Pain      Pre- op      HPI 61 year old female with history of T10-S1 HNP/stenosis comes in for prep evaluation.  States that symptoms unchanged from previous visit.  She is wanting to proceed with Removal of the spinal cord stimulator and thoracolumbar fusion T10 to S1 with Transforaminal Lumbar Interbody Fusions left L1-2, L2-3, L3-4 and L4-5, bilateral L5-S1 foramenotomies and left T10-11 foramenotomy this would be done with rods, screws and cages with local bone graft and allograft, Vivigen as  scheduled.  Today history and physical performed.  Review of systems negative.  Preop cardiology and neurology clearance requested.  Cardiologist note documented:  Preoperative Cardiovascular Risk Assessment:   Ms. Doshi's perioperative risk of a major cardiac event is 6.6% according to the Revised Cardiac Risk Index (RCRI).  Therefore, she is at high risk for perioperative complications.   Her functional capacity is fair at 4.4 METs according to the Duke Activity Status Index (DASI).   Neurology recommended holding her Xarelto 3 days preop with her history of factor V Leiden. Review of Systems  Constitutional:  Positive for activity change.  HENT: Negative.    Respiratory:  Positive for apnea (Sleep apnea).  Musculoskeletal:  Positive for back pain and gait problem.  Neurological:  Positive for numbness.  Psychiatric/Behavioral: Negative.          Objective: Vital Signs: BP 121/81 (BP Location: Left Arm, Patient Position: Sitting, Cuff Size: Large)   Pulse 71   Temp 98.3 F (36.8 C) (Oral)   Resp 20   Ht '5\' 4"'$  (1.626 m)   Wt 262 lb (118.8 kg)   SpO2 97%   BMI 44.97 kg/m    Physical Exam Constitutional:      Appearance: She is obese.  HENT:     Head: Normocephalic and atraumatic.     Nose: Nose normal.  Eyes:     Extraocular Movements: Extraocular movements intact.  Cardiovascular:     Rate and Rhythm: Normal rate and regular rhythm.  Pulmonary:     Effort: Pulmonary effort is normal. No respiratory distress.     Breath sounds: Normal breath sounds.  Abdominal:     General: Bowel sounds are normal. There is no distension.     Tenderness: There is no abdominal tenderness.  Neurological:     Mental Status: She is alert and oriented to person, place, and time.  Psychiatric:        Mood and Affect: Mood normal.        Ortho Exam   Specialty Comments:  No specialty comments available.   Imaging: No results found.     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   Gastroesophageal reflux disease 09/22/2019   SSBE (short-segment Barrett's esophagus) 09/22/2019   Flatulence 09/22/2019   Migraine 11/11/2018   Abdominal pain, epigastric 10/14/2018   LUQ pain 10/14/2018   Attention deficit hyperactivity disorder (ADHD) 01/12/2018   Insomnia 01/12/2018   Elevated liver enzymes 09/10/2017   History of diabetes mellitus 06/06/2017   Primary osteoarthritis of right hand 06/06/2017   Transaminasemia     TIA (transient ischemic attack) 05/01/2017   Dysphasia 05/01/2017   Chronic pain syndrome 05/01/2017   Confusion     Presence of right artificial knee joint 09/17/2016   H/O total knee replacement, right 08/15/2016   Presence of retained hardware     Memory disorder 08/14/2016   Cerebrovascular disease 08/14/2016   Unilateral primary osteoarthritis, right knee 05/29/2016   Primary osteoarthritis of both feet 05/29/2016   Trochanteric bursitis of both hips 05/25/2016   Neck pain 05/25/2016   Urinary urgency 04/19/2016   Chronic low back pain 01/11/2016   Depression 11/29/2015   Total knee replacement status 03/23/2015   Heart palpitations 12/14/2013   Generalized osteoarthritis of multiple sites 11/04/2013   Elevated LFTs 03/17/2012   Cerebral infarction ([place]) 10/30/2011   PFO (patent foramen ovale) 10/30/2011   Bradycardia 10/30/2011   Chest pain 10/30/2011   Hypothyroid     Thrombophlebitis     Sleep apnea     Fibromyalgia     Status post bariatric surgery 12/07/2010   Vein disorder 11/29/2010   S/P total hysterectomy and bilateral salpingo-oophorectomy 11/29/2010   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   S/P cholecystectomy 11/29/2010   S/P ACL surgery 11/29/2010   Morbid obesity ([place]) 11/10/2010         Past Medical History:  Diagnosis Date   ADD (attention deficit disorder)  Arthritis      "all over"   Cerebral infarction ([place]) 10/30/2011   Cerebrovascular disease 08/14/2016   Chronic back pain      "all over"   Chronic low back pain 57/84/6962   Complication of anesthesia      tends to have hypotension when NPO and post-anesthesia   Constipation      takes stool softener daily   Degenerative disk disease     Degenerative joint disease     DVT (deep venous thrombosis) ([place]) 2014    RLE   Family history of adverse reaction to anesthesia      a family member woke up during surgery; "think it was my mom"   Fibromyalgia     Generalized osteoarthritis of multiple sites 11/04/2013   GERD (gastroesophageal reflux disease)     Headache     Heart palpitations 12/14/2013    resolved   History of blood clots      superficial   Hypoglycemia     Hypothyroid      takes Synthroid daily   Incomplete emptying of bladder     Insomnia      takes Trazodone nightly   Iron deficiency anemia      takes Ferrous Sulfate daily   Joint pain     Joint swelling      knees and ankles   Memory disorder 08/14/2016   Morbid obesity ([place])     Nausea      takes Zofran as needed.Seeing GI doc   Neck pain 05/25/2016   OSA on CPAP      tested more than 5 yrs ago.     Osteoarthritis     Osteopenia      in feet   PFO (patent foramen ovale)      no murmur   Pre-diabetes     Primary osteoarthritis of both feet 05/29/2016    Right bunionectomy August 2017 by Dr. 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   Wears glasses           Family History  Problem Relation Age of Onset   Cancer Mother          skin and breast   Myasthenia gravis Mother     Breast cancer Mother     Heart disease Father     Cancer Father     Parkinson's disease Father     Heart disease Sister     Heart attack Sister     Heart disease Brother     Cancer Brother     Diabetes Brother     Stroke Brother     Cancer Maternal Grandfather     Hypothyroidism Daughter     Hypertension Other     Colon cancer Neg Hx           Past Surgical History:  Procedure Laterality Date   BIOPSY   11/14/2018    Procedure: BIOPSY;  Surgeon: Rogene Houston, MD;  [facility];  Service: Endoscopy;;  esophagusgastric   BONE EXCISION Right 08/29/2017    Procedure: right trapezium excision;  Surgeon: Daryll Brod, MD;  Location: [place];  Service: Orthopedics;  Laterality: Right;   BUNIONECTOMY Right 08/2015   CARDIAC CATHETERIZATION        2008.  "it was fine" (not sure why she had it done, and doesn't know where)   CARPOMETACARPEL SUSPENSION PLASTY Right 08/29/2017    Procedure: SUSPENSION PLASTY RIGHT THUMB;  Surgeon: Daryll Brod, MD;  Location: [place];  Service: Orthopedics;  Laterality: Right;   COLONOSCOPY N/A 03/25/2013    Procedure: COLONOSCOPY;  Surgeon: Rogene Houston, MD;  [facility];  Service: Endoscopy;  Laterality: N/A;  930   ERCP   06/02/2020   ESOPHAGOGASTRODUODENOSCOPY       ESOPHAGOGASTRODUODENOSCOPY (EGD) WITH PROPOFOL N/A 11/14/2018    Procedure: ESOPHAGOGASTRODUODENOSCOPY (EGD) WITH PROPOFOL;  Surgeon: Rogene Houston, MD;  [facility];  Service: Endoscopy;  Laterality: N/A;  1:55pm-office moved to 11:00am/pt notified to arrive at 9:30am per [place]        "took fallopian tubes out"   [place] Right 02/12/2020    Procedure: HALLUX INTERPHANGEAL JOINT  FUSION;  Surgeon: Felipa Furnace, DPM;  Location:  [place];  Service: Podiatry;  Laterality: Right;   HAMMER TOE SURGERY Right 02/12/2020    Procedure: SECOND AND THIRD HAMMER TOE CORRECTION; CAPSULOTOMY SECOND INTERPHALANGEAL JOINT;  Surgeon: Felipa Furnace, DPM;  Location: [place];  Service: Podiatry;  Laterality: Right;   JOINT REPLACEMENT        bil knee    KNEE ARTHROSCOPY Left     KNEE ARTHROSCOPY W/ ACL RECONSTRUCTION Right yrs ago    "added pins"   LAPAROSCOPIC CHOLECYSTECTOMY   ~ 2001   ROUX-EN-Y GASTRIC BYPASS   11/20/2010    [place]   SPINAL CORD STIMULATOR INSERTION N/A 04/18/2017    Procedure: LUMBAR SPINAL CORD STIMULATOR INSERTION;  Surgeon: Clydell Hakim, MD;  Location: [place];  Service: Neurosurgery;  Laterality: N/A;  LUMBAR SPINAL CORD STIMULATOR INSERTION   stomach stent   04/28/2020   TENDON TRANSFER Right 08/29/2017    Procedure: right abductor pollicis longus transfer;  Surgeon: Daryll Brod, MD;  Location: [place];  Service: Orthopedics;  Laterality: Right;   TOTAL KNEE ARTHROPLASTY Left 03/23/2015    Procedure: TOTAL KNEE ARTHROPLASTY;  Surgeon: Newt Minion, MD;  Location: [place];  Service: Orthopedics;  Laterality: Left;   TOTAL KNEE ARTHROPLASTY Right 08/15/2016    Procedure: RIGHT TOTAL KNEE ARTHROPLASTY, REMOVAL ACL SCREWS;  Surgeon: Newt Minion, MD;  Location: [place];  Service: Orthopedics;  Laterality: Right;   TOTAL KNEE ARTHROPLASTY WITH HARDWARE REMOVAL Right     VAGINAL HYSTERECTOMY        tah/bso   VARICOSE VEIN SURGERY Right X 2   WEIL OSTEOTOMY Right 02/12/2020    Procedure: DOUBLE L OSTEOTOMY;  Surgeon: Felipa Furnace, DPM;  Location: [place];  Service: Podiatry;  Laterality: Right;    Social History         Occupational History   Occupation: Disability   Occupation: formerly Therapist, sports, [employer]  Tobacco Use   Smoking status: Former      Packs/day: 0.75      Years: 8.00      Total pack years: 6.00      Types: Cigarettes  Quit  date: 12/01/1990      Years since quitting: 30.7      Passive exposure: Past   Smokeless tobacco: Never   Tobacco comments:      quit smoking in the 1990s  Vaping Use   Vaping Use: Never used  Substance and Sexual Activity   Alcohol use: No      Comment: 03/23/2015 "stopped drinking in 2012 w/gastric bypass; drank socially before bypass"   Drug use: No   Sexual activity: Not Currently      Birth control/protection: Surgical

## 2021-08-21 NOTE — Anesthesia Postprocedure Evaluation (Signed)
Anesthesia Post Note  Patient: Debbie Pineda  Procedure(s) Performed: Removal of the spinal cord stimulator and thoracolumbar fusion T10 to S1 with Transforaminal Lumbar Interbody Fusions left L1-2, L2-3, L3-4 and L4-5, bilateral L5-S1 foramenotomies and left T10-11 foramenotomy this would be done with rods, screws and cages with local bone graft and allograft, Vivigen (Back)     Patient location during evaluation: PACU Anesthesia Type: General Level of consciousness: confused and responds to stimulation Pain management: pain level controlled Vital Signs Assessment: post-procedure vital signs reviewed and stable Respiratory status: spontaneous breathing, nonlabored ventilation, respiratory function stable and patient connected to nasal cannula oxygen Cardiovascular status: blood pressure returned to baseline and stable Postop Assessment: no apparent nausea or vomiting Anesthetic complications: no   No notable events documented.  Last Vitals:  Vitals:   08/21/21 2215 08/21/21 2230  BP: 109/75 117/71  Pulse: 83 81  Resp: (!) 21 20  Temp: 36.7 C 36.7 C  SpO2: 93% 93%    Last Pain:  Vitals:   08/21/21 2230  TempSrc:   PainSc: [place]

## 2021-08-22 LAB — CBC
HCT: 29.4 % — ABNORMAL LOW (ref 36.0–46.0)
Hemoglobin: 10 g/dL — ABNORMAL LOW (ref 12.0–15.0)
MCH: 32.5 pg (ref 26.0–34.0)
MCHC: 34 g/dL (ref 30.0–36.0)
MCV: 95.5 fL (ref 80.0–100.0)
Platelets: 143 10*3/uL — ABNORMAL LOW (ref 150–400)
RBC: 3.08 MIL/uL — ABNORMAL LOW (ref 3.87–5.11)
RDW: 12.1 % (ref 11.5–15.5)
WBC: 8.5 10*3/uL (ref 4.0–10.5)
nRBC: 0 % (ref 0.0–0.2)

## 2021-08-22 LAB — BASIC METABOLIC PANEL
Anion gap: 7 (ref 5–15)
BUN: 7 mg/dL (ref 6–20)
CO2: 21 mmol/L — ABNORMAL LOW (ref 22–32)
Calcium: 7.9 mg/dL — ABNORMAL LOW (ref 8.9–10.3)
Chloride: 111 mmol/L (ref 98–111)
Creatinine, Ser: 0.76 mg/dL (ref 0.44–1.00)
GFR, Estimated: >60 mL/min (ref >60)
Glucose, Bld: 140 mg/dL — ABNORMAL HIGH (ref 70–99)
Potassium: 3.7 mmol/L (ref 3.5–5.1)
Sodium: 139 mmol/L (ref 135–145)

## 2021-08-22 MED ORDER — KETOROLAC TROMETHAMINE 30 MG/ML IJ SOLN
30.0000 mg | Freq: Three times a day (TID) | INTRAMUSCULAR | Status: AC
  Administered 2021-08-22 – 2021-08-27 (×15): 30 mg via INTRAVENOUS
  Filled 2021-08-22 (×15): qty 1

## 2021-08-22 MED FILL — Thrombin For Soln Kit 20000 Unit: CUTANEOUS | Qty: 1 | Status: AC

## 2021-08-22 NOTE — Evaluation (Signed)
Physical Therapy Evaluation Patient Details Name: Debbie Pineda MRN: [id number] DOB: 11-08-1960 Today's Date: 08/22/2021  History of Present Illness  Patient is a 61 y/o female who presents on 08/21/21 s/p thoracolumbar fusion T10-S1 and TLIF L1-5 and bilateral L5-S1 PMH includes CVA, factor V Leiden, chronic pain syndrome, cognitive impairment, DVT, ADD, PFO, OSA on CPAP.  Clinical Impression  Patient presents with lethargy, pain and post surgical deficits s/p above surgery. History obtained mostly from spouse as pt sleepy and difficult to arouse. Pt has been using SPC for ambulation PTA and independent for ADLs, not working. Today, pt requires min-Mod A of 2 for bed mobility and support for sitting balance due to decreased arousal. Pt with hx of cognitive deficits, more assessment recommended when more awake. Session limited due to level of arousal and pain. At this point, pt would benefit from SNF to maximize independence and mobility prior to return home. Will follow acutely. OT notified ortho tech of malfitting brace.      Recommendations for follow up therapy are one component of a multi-disciplinary discharge planning process, led by the attending physician.  Recommendations may be updated based on patient status, additional functional criteria and insurance authorization.  Follow Up Recommendations Skilled nursing-short term rehab (<3 hours/day) Can patient physically be transported by private vehicle: No    Assistance Recommended at Discharge Frequent or constant Supervision/Assistance  Patient can return home with the following  A lot of help with walking and/or transfers;A lot of help with bathing/dressing/bathroom;Assistance with cooking/housework;Direct supervision/assist for medications management;Help with stairs or ramp for entrance;Direct supervision/assist for financial management;Assist for transportation    Equipment Recommendations Other (comment) (defer to next venue, TBD)   Recommendations for Other Services       Functional Status Assessment Patient has had a recent decline in their functional status and demonstrates the ability to make significant improvements in function in a reasonable and predictable amount of time.     Precautions / Restrictions Precautions Precautions: Back;Fall Precaution Booklet Issued: No Precaution Comments: reviewed with pt Required Braces or Orthoses: Spinal Brace Spinal Brace: Thoracolumbosacral orthotic;Applied in sitting position Restrictions Weight Bearing Restrictions: No      Mobility  Bed Mobility Overal bed mobility: Needs Assistance Bed Mobility: Rolling, Sidelying to Sit, Sit to Sidelying Rolling: Min assist, +2 for physical assistance, +2 for safety/equipment Sidelying to sit: Min assist, +2 for physical assistance, +2 for safety/equipment     Sit to sidelying: Mod assist, +2 for safety/equipment, +2 for physical assistance General bed mobility comments: cueing to sequence through log roll technique but overall min assist for trunk support/guiding legs towards EOB; returned to supine with assist for BLEs to bed    Transfers                   General transfer comment: deferred, pt asking to lay back down    Ambulation/Gait                  Stairs            Wheelchair Mobility    Modified Rankin (Stroke Patients Only)       Balance Overall balance assessment: Needs assistance Sitting-balance support: No upper extremity supported, Single extremity supported, Feet supported Sitting balance-Leahy Scale: Poor Sitting balance - Comments: min assist without UE support, min guard with 1 UE support Postural control: Posterior lean  Pertinent Vitals/Pain Pain Assessment Pain Assessment: Faces Faces Pain Scale: Hurts little more Pain Location: back Pain Descriptors / Indicators: Discomfort, Grimacing, Operative site  guarding Pain Intervention(s): Monitored during session, Limited activity within patient's tolerance, Repositioned    Home Living Family/patient expects to be discharged to:: Private residence Living Arrangements: Spouse/significant other Available Help at Discharge: Family;Available PRN/intermittently Type of Home: Mobile home Home Access: Stairs to enter Entrance Stairs-Rails:  (+1 rail) Entrance Stairs-Number of Steps: 4   Home Layout: One level Home Equipment: Cane - single Barista (2 wheels);BSC/3in1 Additional Comments: husband home for 1 week    Prior Function Prior Level of Function : Independent/Modified Independent;Driving             Mobility Comments: cane for mobility ADLs Comments: independent ADLs, IADLs, driving     Hand Dominance   Dominant Hand: Right    Extremity/Trunk Assessment   Upper Extremity Assessment Upper Extremity Assessment: Defer to OT evaluation    Lower Extremity Assessment Lower Extremity Assessment: Difficult to assess due to impaired cognition (ABle to kick BLEs against gravity however poor assessment due to lethargy)    Cervical / Trunk Assessment Cervical / Trunk Assessment: Back Surgery  Communication   Communication: No difficulties  Cognition Arousal/Alertness: Lethargic Behavior During Therapy: Flat affect Overall Cognitive Status: History of cognitive impairments - at baseline                                 General Comments: per chart hx of cognitive impairment, pt oriented and follows simple commands with increased time but noted slow processing and decreased awareness.  Anticipate some medication related as well. Continue assessment        General Comments General comments (skin integrity, edema, etc.): Spouse present during session. VSS.    Exercises     Assessment/Plan    PT Assessment Patient needs continued PT services  PT Problem List Decreased strength;Decreased  mobility;Decreased safety awareness;Decreased skin integrity;Pain;Decreased balance;Decreased activity tolerance;Decreased cognition;Decreased knowledge of precautions       PT Treatment Interventions Therapeutic activities;Gait training;Balance training;Functional mobility training;Therapeutic exercise;Patient/family education;DME instruction;Cognitive remediation;Neuromuscular re-education;Stair training    PT Goals (Current goals can be found in the Care Plan section)  Acute Rehab PT Goals Patient Stated Goal: per spouse, to go to rehab PT Goal Formulation: With family Time For Goal Achievement: 09/05/21 Potential to Achieve Goals: Fair    Frequency Min 5X/week     Co-evaluation PT/OT/SLP Co-Evaluation/Treatment: Yes Reason for Co-Treatment: Complexity of the patient's impairments (multi-system involvement);Necessary to address cognition/behavior during functional activity;For patient/therapist safety;To address functional/ADL transfers           AM-PAC PT "6 Clicks" Mobility  Outcome Measure Help needed turning from your back to your side while in a flat bed without using bedrails?: A Little Help needed moving from lying on your back to sitting on the side of a flat bed without using bedrails?: A Lot Help needed moving to and from a bed to a chair (including a wheelchair)?: A Lot Help needed standing up from a chair using your arms (e.g., wheelchair or bedside chair)?: Total Help needed to walk in hospital room?: Total Help needed climbing 3-5 steps with a railing? : Total 6 Click Score: 10    End of Session   Activity Tolerance: Patient limited by lethargy;Patient limited by pain Patient left: in bed;with call bell/phone within reach;with bed alarm set;with family/visitor present Nurse Communication:  Mobility status PT Visit Diagnosis: Pain;Muscle weakness (generalized) (M62.81);Difficulty in walking, not elsewhere classified (R26.2) Pain - part of body:  (back)     Time: 1898-4210 PT Time Calculation (min) (ACUTE ONLY): 24 min   Charges:   PT Evaluation $PT Eval Moderate Complexity: 1 Mod          Marisa Severin, PT, DPT Acute Rehabilitation Services Secure chat preferred Office [phone]     Marguarite Arbour A Zanyiah Posten 08/22/2021, 11:30 AM

## 2021-08-22 NOTE — Progress Notes (Signed)
Patient ID: Debbie Pineda, female   DOB: 1960/12/07, 60 y.o.   MRN: [id number] Awake, alert and oriented x 4. Main complaints are related to pain control. She is quite painful and complains that meds are Not relieving the pain. She was on nucyenta pre op in large doses so is likely opioid tolerant. Foley discontinued and she is has voided since discontinuing her foley and she has a wick catheter in place. Did not work with PT today due to drowsiness.   Motor intact both legs. Both legs with normal  sensation and normal capilary refill, legs are warm. Will allow toradol and can use muscle relaxer.  PT OT

## 2021-08-22 NOTE — Evaluation (Signed)
Occupational Therapy Evaluation Patient Details Name: Debbie Pineda MRN: [id number] DOB: 14-Feb-1960 Today's Date: 08/22/2021   History of Present Illness Patient is a 61 y/o female who presents on 08/21/21 s/p thoracolumbar fusion T10-S1 and TLIF L1-5 and bilateral L5-S1 PMH includes CVA, factor V Leiden, chronic pain syndrome, cognitive impairment, DVT, ADD, PFO, OSA on CPAP.   Clinical Impression   PTA patient independent with ADLs, IADLs and driving, using cane for mobility.  Patient admitted for above and presents with problem list below, including pain, lethargy, decreased activity tolerance, weakness, and impaired balance.  Limited assessment cognitively, but per chart hx of deficits since CVA; pt oriented and follows simple commands with increased time but presents with poor awareness, problem solving, and attention.  She completes bed mobility with min-mod assist +2, limited to EOB due to pain and lethargy.  She requires min- total assist +2 for ADLs.  Noted decreased coordination in L UE, overshooting when reaching with [place]es but noted visual deficits at  baseline (pt unable to report deficits at this time). Educated pt on back brace, precautions, safety and activity progression. Believe she will benefit from continued OT services acutely and after dc at SNF level to optimize independence, safety and return to PLOF.    Recommendations for follow up therapy are one component of a multi-disciplinary discharge planning process, led by the attending physician.  Recommendations may be updated based on patient status, additional functional criteria and insurance authorization.   Follow Up Recommendations  Skilled nursing-short term rehab (<3 hours/day) (pending progress)    Assistance Recommended at Discharge Frequent or constant Supervision/Assistance  Patient can return home with the following Two people to help with walking and/or transfers;Assistance with cooking/housework;Two people to help  with bathing/dressing/bathroom;Direct supervision/assist for medications management;Direct supervision/assist for financial management;Assist for transportation;Help with stairs or ramp for entrance    Functional Status Assessment  Patient has had a recent decline in their functional status and demonstrates the ability to make significant improvements in function in a reasonable and predictable amount of time.  Equipment Recommendations  Other (comment) (defer)    Recommendations for Other Services       Precautions / Restrictions Precautions Precautions: Back;Fall Precaution Booklet Issued: No Precaution Comments: reviewed with pt Restrictions Weight Bearing Restrictions: No      Mobility Bed Mobility Overal bed mobility: Needs Assistance Bed Mobility: Rolling, Sidelying to Sit, Sit to Sidelying Rolling: Min assist, +2 for physical assistance, +2 for safety/equipment Sidelying to sit: Min assist, +2 for physical assistance, +2 for safety/equipment     Sit to sidelying: Mod assist, +2 for safety/equipment, +2 for physical assistance General bed mobility comments: cueing to sequence through log roll technique but overall min assist for trunk support/guiding legs towards EOB; returned to supine with assist for BLEs to bed    Transfers                   General transfer comment: deferred, pt asking to lay back down      Balance Overall balance assessment: Needs assistance Sitting-balance support: No upper extremity supported, Single extremity supported, Feet supported Sitting balance-Leahy Scale: Poor Sitting balance - Comments: min assist without UE support, min guard with 1 UE support Postural control: Posterior lean                                 ADL either performed or assessed with clinical judgement  ADL Overall ADL's : Needs assistance/impaired     Grooming: Minimal assistance;Sitting           Upper Body Dressing : Sitting;Moderate  assistance   Lower Body Dressing: Total assistance;+2 for physical assistance;+2 for safety/equipment;Sitting/lateral leans;Bed level     Toilet Transfer Details (indicate cue type and reason): deferred         Functional mobility during ADLs: Minimal assistance;+2 for physical assistance;+2 for safety/equipment;Moderate assistance General ADL Comments: limited to EOB due to lethargy, pain and decreased activity tolerance/participation     Vision Baseline Vision/History: 1 Wears glasses Patient Visual Report:  (per chart R vision changes since CVA, pt unable to describe today) Vision Assessment?: Vision impaired- to be further tested in functional context Additional Comments: ? if baseline deficits, further assessment required. pt tends to overshoot when reaching, could be lethargy related     Perception     Praxis      Pertinent Vitals/Pain Pain Assessment Pain Assessment: Faces Faces Pain Scale: Hurts little more Pain Location: back Pain Descriptors / Indicators: Discomfort, Grimacing, Operative site guarding Pain Intervention(s): Limited activity within patient's tolerance, Monitored during session, Repositioned     Hand Dominance Right   Extremity/Trunk Assessment Upper Extremity Assessment Upper Extremity Assessment: Generalized weakness (? L UE decreased coordination- continue assessment. limited assessment due to lethargy/participation)   Lower Extremity Assessment Lower Extremity Assessment: Defer to PT evaluation   Cervical / Trunk Assessment Cervical / Trunk Assessment: Back Surgery   Communication Communication Communication: No difficulties   Cognition Arousal/Alertness: Lethargic Behavior During Therapy: Flat affect Overall Cognitive Status: History of cognitive impairments - at baseline                                 General Comments: per chart hx of cognitive impairment, pt oriented and follows simple commands with increased time but  noted slow processing and decreased awareness.  Anticipate some medication related as well. Continue assessment     General Comments  spouse present and supportive, VSS    Exercises     Shoulder Instructions      Home Living Family/patient expects to be discharged to:: Private residence Living Arrangements: Spouse/significant other Available Help at Discharge: Family Type of Home: Mobile home Home Access: Stairs to enter Entrance Stairs-Number of Steps: 4 Entrance Stairs-Rails:  (+1 rail) Home Layout: One level     Bathroom Shower/Tub: Teacher, early years/pre: Handicapped height     [place]: [place] - single Barista (2 wheels);BSC/3in1   Additional Comments: husband home for 1 week      Prior Functioning/Environment Prior Level of Function : Independent/Modified Independent;Driving             Mobility Comments: cane for mobility ADLs Comments: independent ADLs, IADLs, driving        OT Problem List: Decreased strength;Decreased activity tolerance;Impaired balance (sitting and/or standing);Decreased coordination;Decreased cognition;Decreased safety awareness;Decreased knowledge of use of DME or AE;Decreased knowledge of precautions;Obesity;Pain      OT Treatment/Interventions: Self-care/ADL training;Therapeutic exercise;DME and/or AE instruction;Therapeutic activities;Patient/family education;Balance training;Cognitive remediation/compensation    OT Goals(Current goals can be found in the care plan section) Acute Rehab OT Goals Patient Stated Goal: none stated OT Goal Formulation: With patient Time For Goal Achievement: 09/05/21 Potential to Achieve Goals: Fair  OT Frequency: Min 2X/week    Co-evaluation              AM-PAC OT "  6 Clicks" Daily Activity     Outcome Measure Help from another person eating meals?: A Little Help from another person taking care of personal grooming?: A Little Help from another person toileting,  which includes using toliet, bedpan, or urinal?: Total Help from another person bathing (including washing, rinsing, drying)?: A Lot Help from another person to put on and taking off regular upper body clothing?: A Lot Help from another person to put on and taking off regular lower body clothing?: Total 6 Click Score: 12   End of Session Equipment Utilized During Treatment: Back brace Nurse Communication: Mobility status  Activity Tolerance: Patient limited by lethargy Patient left: in bed;with call bell/phone within reach;with bed alarm set;with family/visitor present  OT Visit Diagnosis: Other abnormalities of gait and mobility (R26.89);Muscle weakness (generalized) (M62.81);Pain Pain - part of body:  (back)                Time: 1410-3013 OT Time Calculation (min): 24 min Charges:  OT General Charges $OT Visit: 1 Visit OT Evaluation $OT Eval Moderate Complexity: [place], OT Acute Rehabilitation Services Office [place] 08/22/2021, 10:55 AM

## 2021-08-22 NOTE — TOC Progression Note (Signed)
Transition of Care [facility]) - Progression Note    Patient Details  Name: Debbie Pineda MRN: [id number] Date of Birth: 10/25/1960  Transition of Care [facility]) CM/SW [place], RN Phone [phone]  08/22/2021, 9:16 AM  Clinical Narrative:    [facility] acknowledges general consult for PT / OT / SLP / DME as needed.         Expected Discharge Plan and Services                                                 Social Determinants of Health (SDOH) Interventions    Readmission Risk Interventions     No data to display

## 2021-08-22 NOTE — Progress Notes (Signed)
     Subjective: 1 Day Post-Op Procedure(s) (LRB): Removal of the spinal cord stimulator and thoracolumbar fusion T10 to S1 with Transforaminal Lumbar Interbody Fusions left L1-2, L2-3, L3-4 and L4-5, bilateral L5-S1 foramenotomies and left T10-11 foramenotomy this would be done with rods, screws and cages with local bone graft and allograft, Vivigen (N/A) Awake, alert and oriented x 4. Dressing intact, hemovac right lower lumbar intact. Foley discontinued and she has voided. Legs are N-V normal .  Patient reports pain as marked.    Objective:   VITALS:  Temp:  [97.1 F (36.2 C)-99.4 F (37.4 C)] 99.3 F (37.4 C) (08/01 1126) Pulse Rate:  [80-100] 89 (08/01 1126) Resp:  [13-34] 15 (08/01 1126) BP: (103-148)/(53-104) 147/80 (08/01 1126) SpO2:  [92 %-98 %] 96 % (08/01 1126) Arterial Line BP: (160-178)/(89-101) 170/89 (07/31 2030)  Neurologically intact ABD soft Neurovascular intact Sensation intact distally Intact pulses distally Dorsiflexion/Plantar flexion intact Incision: scant drainage   LABS Recent Labs    08/21/21 1014 08/21/21 1144 08/21/21 1520 08/21/21 1809 08/22/21 0647  HGB 10.5* 10.9* 10.9* 11.9* 10.0*  WBC  --   --   --   --  8.5  PLT  --   --   --   --  143*   Recent Labs    08/21/21 1809 08/22/21 0647  NA 141 139  K 4.4 3.7  CL  --  111  CO2  --  21*  BUN  --  7  CREATININE  --  0.76  GLUCOSE  --  140*   No results for input(s): "LABPT", "INR" in the last 72 hours.   Assessment/Plan: 1 Day Post-Op Procedure(s) (LRB): Removal of the spinal cord stimulator and thoracolumbar fusion T10 to S1 with Transforaminal Lumbar Interbody Fusions left L1-2, L2-3, L3-4 and L4-5, bilateral L5-S1 foramenotomies and left T10-11 foramenotomy this would be done with rods, screws and cages with local bone graft and allograft, Vivigen (N/A)  Advance diet Up with therapy D/C IV fluids  [name] 08/22/2021, 1:59 PM Patient ID: Debbie Pineda, female    DOB: 09/24/1960, 61 y.o.   MRN: [id number]

## 2021-08-22 NOTE — Progress Notes (Signed)
Orthopedic Tech Progress Note Patient Details:  Debbie Pineda Aug 07, 1960 [id number]  Ortho Devices Type of Ortho Device: Thoracolumbar corset (TLSO) Ortho Device/Splint Location: delivered to room Ortho Device/Splint Interventions: Ordered, Application, Adjustment   Post Interventions Patient Tolerated: Well Instructions Provided: Care of device, Adjustment of device  [name] 08/22/2021, 1:25 AM

## 2021-08-23 LAB — CBC WITH DIFFERENTIAL/PLATELET
Abs Immature Granulocytes: 0.07 10*3/uL (ref 0.00–0.07)
Basophils Absolute: 0 10*3/uL (ref 0.0–0.1)
Basophils Relative: 0 %
Eosinophils Absolute: 0 10*3/uL (ref 0.0–0.5)
Eosinophils Relative: 0 %
HCT: 24.5 % — ABNORMAL LOW (ref 36.0–46.0)
Hemoglobin: 8.3 g/dL — ABNORMAL LOW (ref 12.0–15.0)
Immature Granulocytes: 1 %
Lymphocytes Relative: 19 %
Lymphs Abs: 1.6 10*3/uL (ref 0.7–4.0)
MCH: 31.6 pg (ref 26.0–34.0)
MCHC: 33.9 g/dL (ref 30.0–36.0)
MCV: 93.2 fL (ref 80.0–100.0)
Monocytes Absolute: 0.7 10*3/uL (ref 0.1–1.0)
Monocytes Relative: 9 %
Neutro Abs: 5.6 10*3/uL (ref 1.7–7.7)
Neutrophils Relative %: 71 %
Platelets: 153 10*3/uL (ref 150–400)
RBC: 2.63 MIL/uL — ABNORMAL LOW (ref 3.87–5.11)
RDW: 12.3 % (ref 11.5–15.5)
WBC: 8 10*3/uL (ref 4.0–10.5)
nRBC: 0 % (ref 0.0–0.2)

## 2021-08-23 LAB — BASIC METABOLIC PANEL
Anion gap: 4 — ABNORMAL LOW (ref 5–15)
BUN: 7 mg/dL (ref 6–20)
CO2: 22 mmol/L (ref 22–32)
Calcium: 7.9 mg/dL — ABNORMAL LOW (ref 8.9–10.3)
Chloride: 113 mmol/L — ABNORMAL HIGH (ref 98–111)
Creatinine, Ser: 0.8 mg/dL (ref 0.44–1.00)
GFR, Estimated: >60 mL/min (ref >60)
Glucose, Bld: 130 mg/dL — ABNORMAL HIGH (ref 70–99)
Potassium: 3.2 mmol/L — ABNORMAL LOW (ref 3.5–5.1)
Sodium: 139 mmol/L (ref 135–145)

## 2021-08-23 MED ORDER — POTASSIUM CHLORIDE CRYS ER 20 MEQ PO TBCR
60.0000 meq | EXTENDED_RELEASE_TABLET | Freq: Once | ORAL | Status: AC
Start: 2021-08-23 — End: 2021-08-23
  Administered 2021-08-23: 60 meq via ORAL
  Filled 2021-08-23: qty 3

## 2021-08-23 NOTE — Progress Notes (Signed)
Physical Therapy Treatment Patient Details Name: Debbie Pineda MRN: [id number] DOB: 1960-07-12 Today's Date: 08/23/2021   History of Present Illness Patient is a 61 y/o female who presents on 08/21/21 s/p thoracolumbar fusion T10-S1 and TLIF L1-5 and bilateral L5-S1. Noted in MD note - pt on significant pain meds prior to admission and is likely opioid tolerant.  PMH includes CVA, factor V Leiden, chronic pain syndrome, cognitive impairment, DVT, ADD, PFO, OSA on CPAP.    PT Comments    Pt with significant improvement today.  She still reports pain at 8/10 but wasn't visually symptomatic and was able to ambulate in hall.  Pt supervision to min guard for transfers with good adherence to back precautions.  She ambulated 180' , educated on back precautions, and requested to return to supine.  Continue to progress as able.  Reports initial plan was for SNF but with support from PT/mobility perspective could progress to home with HHPT.     Recommendations for follow up therapy are one component of a multi-disciplinary discharge planning process, led by the attending physician.  Recommendations may be updated based on patient status, additional functional criteria and insurance authorization.  Follow Up Recommendations  Home health PT Can patient physically be transported by private vehicle: Yes   Assistance Recommended at Discharge Intermittent Supervision/Assistance  Patient can return home with the following A little help with walking and/or transfers;A little help with bathing/dressing/bathroom;Assistance with cooking/housework;Help with stairs or ramp for entrance   Equipment Recommendations  None recommended by PT (Has RW and rollator)    Recommendations for Other Services       Precautions / Restrictions Precautions Precautions: Back;Fall Precaution Booklet Issued: Yes (comment) Required Braces or Orthoses: Spinal Brace Spinal Brace: Thoracolumbosacral orthotic;Applied in sitting  position     Mobility  Bed Mobility Overal bed mobility: Needs Assistance Bed Mobility: Rolling, Sidelying to Sit, Sit to Sidelying Rolling: Supervision Sidelying to sit: Supervision     Sit to sidelying: Supervision General bed mobility comments: Cued for log roll and demonstrated well with use of rails    Transfers Overall transfer level: Needs assistance Equipment used: Rolling walker (2 wheels) Transfers: Sit to/from Stand Sit to Stand: Min guard           General transfer comment: min guard for safety    Ambulation/Gait Ambulation/Gait assistance: Min guard Gait Distance (Feet): 180 Feet Assistive device: Rolling walker (2 wheels) Gait Pattern/deviations: Step-through pattern, Decreased stride length Gait velocity: decreased     General Gait Details: Steady gait, good posture, min cues for RW   Stairs             Wheelchair Mobility    Modified Rankin (Stroke Patients Only)       Balance Overall balance assessment: Needs assistance Sitting-balance support: No upper extremity supported, Feet supported Sitting balance-Leahy Scale: Good     Standing balance support: Bilateral upper extremity supported, Reliant on assistive device for balance Standing balance-Leahy Scale: Poor                              Cognition Arousal/Alertness: Awake/alert Behavior During Therapy: WFL for tasks assessed/performed Overall Cognitive Status: Within Functional Limits for tasks assessed                                 General Comments: per chart hx of cognitive impairments but was  no obvious deficits with therapy today        Exercises      General Comments General comments (skin integrity, edema, etc.): Spouse present; Pt verbalizing back precautions and educated on back precautions with activities; VSS      Pertinent Vitals/Pain Pain Assessment Pain Assessment: 0-10 Pain Score: 8  Pain Location: back Pain Descriptors /  Indicators: Discomfort (no signs of pain) Pain Intervention(s): Limited activity within patient's tolerance, Monitored during session, Premedicated before session    Home Living                          Prior Function            PT Goals (current goals can now be found in the care plan section) Progress towards PT goals: Progressing toward goals    Frequency    Min 5X/week      PT Plan Discharge plan needs to be updated    Co-evaluation              AM-PAC PT "6 Clicks" Mobility   Outcome Measure  Help needed turning from your back to your side while in a flat bed without using bedrails?: A Little Help needed moving from lying on your back to sitting on the side of a flat bed without using bedrails?: A Little Help needed moving to and from a bed to a chair (including a wheelchair)?: A Little Help needed standing up from a chair using your arms (e.g., wheelchair or bedside chair)?: A Little Help needed to walk in hospital room?: A Little Help needed climbing 3-5 steps with a railing? : A Little 6 Click Score: 18    End of Session Equipment Utilized During Treatment: Gait belt;Back brace (TLSO donned/doffed in sitting) Activity Tolerance: Patient tolerated treatment well Patient left: in bed;with call bell/phone within reach;with bed alarm set;with family/visitor present Nurse Communication: Mobility status PT Visit Diagnosis: Pain;Muscle weakness (generalized) (M62.81);Difficulty in walking, not elsewhere classified (R26.2) Pain - part of body:  (back)     Time: 1531-1550 PT Time Calculation (min) (ACUTE ONLY): 19 min  Charges:  $Gait Training: 8-22 mins                     Abran Richard, PT Acute Rehab [facility] 08/23/2021, 5:35 PM

## 2021-08-23 NOTE — TOC Initial Note (Signed)
Transition of Care [facility]) - Initial/Assessment Note    Patient Details  Name: Debbie Pineda MRN: [id number] Date of Birth: 12-09-1960  Transition of [facility]) CM/SW Contact:    Vinie Sill, LCSW Phone Number: 08/23/2021, 11:39 AM  Clinical Narrative:                  CSW met with patient at bedside and her spouse. CSW introduced self and explained role. CSW discussed PT/OT recommendations. Patient states she is aware of recommendations and agrees with recommendation of short term rehab at [facility]. Patient requested referral to be sent to [facility] only. CSW explained SNF process. Patient states no questions at this time.   TOC will provide bed offer once available  TOC will continue to follow and assist with discharge planning.   Thurmond Butts, MSW, LCSW Clinical Social Worker    Expected Discharge Plan: Skilled Nursing Facility Barriers to Discharge: Ship broker, Continued Medical Work up, SNF Pending bed offer   Patient Goals and CMS Choice        Expected Discharge Plan and Services Expected Discharge Plan: [place] In-house Referral: Clinical Social Work                                            Prior Living Arrangements/Services   Lives with:: Self Patient language and need for interpreter reviewed:: No Do you feel safe going back to the place where you live?: No      Need for Family Participation in Patient Care: Yes (Comment) Care giver support system in place?: Yes (comment)   Criminal Activity/Legal Involvement Pertinent to Current Situation/Hospitalization: No - Comment as needed  Activities of Daily Living      Permission Sought/Granted Permission sought to share information with : Family Supports Permission granted to share information with : Yes, Verbal Permission Granted              Emotional Assessment   Attitude/Demeanor/Rapport: Engaged Affect (typically observed): Accepting,  Appropriate Orientation: : Oriented to Self, Oriented to Place, Oriented to  Time, Oriented to Situation Alcohol / Substance Use: Not Applicable Psych Involvement: No (comment)  Admission diagnosis:  Status post lumbar spinal fusion [Z98.1] Patient Active Problem List   Diagnosis Date Noted   Status post lumbar spinal fusion 08/21/2021   Weight gain 07/13/2021   Encounter for removal of internal fixation device 10/21/2020   LFTs abnormal 03/02/2020   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[place]) 11/18/2019   Gastroesophageal reflux disease 09/22/2019   SSBE (short-segment Barrett's esophagus) 09/22/2019   Flatulence 09/22/2019   Migraine 11/11/2018   Abdominal pain, epigastric 10/14/2018   LUQ pain 10/14/2018   Attention deficit hyperactivity disorder (ADHD) 01/12/2018   Insomnia 01/12/2018   Elevated liver enzymes 09/10/2017   History of diabetes mellitus 06/06/2017   Primary osteoarthritis of right hand 06/06/2017   Transaminasemia    TIA (transient ischemic attack) 05/01/2017   Dysphasia 05/01/2017   Chronic pain syndrome 05/01/2017   Confusion    Presence of right artificial knee joint 09/17/2016   H/O total knee replacement, right 08/15/2016   Presence of retained hardware    Memory disorder 08/14/2016   Cerebrovascular disease 08/14/2016   Unilateral primary osteoarthritis, right knee 05/29/2016   Primary osteoarthritis of both feet 05/29/2016   Trochanteric bursitis of both hips 05/25/2016   Neck pain 05/25/2016  Urinary urgency 04/19/2016   Chronic low back pain 01/11/2016   Depression 11/29/2015   Total knee replacement status 03/23/2015   Heart palpitations 12/14/2013   Generalized osteoarthritis of multiple sites 11/04/2013   Elevated LFTs 03/17/2012   Cerebral infarction ([place]) 10/30/2011   PFO (patent foramen ovale) 10/30/2011   Bradycardia 10/30/2011   Chest pain 10/30/2011   Hypothyroid    Thrombophlebitis    Sleep apnea     Fibromyalgia    Status post bariatric surgery 12/07/2010   Vein disorder 11/29/2010   S/P total hysterectomy and bilateral salpingo-oophorectomy 11/29/2010   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

## 2021-08-23 NOTE — NC FL2 (Signed)
[place] MEDICAID FL2 LEVEL OF CARE SCREENING TOOL     IDENTIFICATION  Patient Name: Debbie Pineda Birthdate: 12-31-1960 Sex: female Admission Date (Current Location): 08/21/2021  [facility] and [place] Number:  [employer] and Address:  The [place], [place] [address] [place], [place] [address]      Provider Number: [id number]  Attending Physician Name and Address:  Jessy Oto, MD  Relative Name and Phone Number:       Current Level of Care: Hospital Recommended Level of Care: [place] Prior Approval Number:    Date Approved/Denied:   PASRR Number: [id number]  Discharge Plan: SNF    Current Diagnoses: Patient Active Problem List   Diagnosis Date Noted   Status post lumbar spinal fusion 08/21/2021   Weight gain 07/13/2021   Encounter for removal of internal fixation device 10/21/2020   LFTs abnormal 03/02/2020   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[place]) 11/18/2019   Gastroesophageal reflux disease 09/22/2019   SSBE (short-segment Barrett's esophagus) 09/22/2019   Flatulence 09/22/2019   Migraine 11/11/2018   Abdominal pain, epigastric 10/14/2018   LUQ pain 10/14/2018   Attention deficit hyperactivity disorder (ADHD) 01/12/2018   Insomnia 01/12/2018   Elevated liver enzymes 09/10/2017   History of diabetes mellitus 06/06/2017   Primary osteoarthritis of right hand 06/06/2017   Transaminasemia    TIA (transient ischemic attack) 05/01/2017   Dysphasia 05/01/2017   Chronic pain syndrome 05/01/2017   Confusion    Presence of right artificial knee joint 09/17/2016   H/O total knee replacement, right 08/15/2016   Presence of retained hardware    Memory disorder 08/14/2016   Cerebrovascular disease 08/14/2016   Unilateral primary osteoarthritis, right knee 05/29/2016   Primary osteoarthritis of both feet 05/29/2016   Trochanteric bursitis of both hips 05/25/2016   Neck pain 05/25/2016    Urinary urgency 04/19/2016   Chronic low back pain 01/11/2016   Depression 11/29/2015   Total knee replacement status 03/23/2015   Heart palpitations 12/14/2013   Generalized osteoarthritis of multiple sites 11/04/2013   Elevated LFTs 03/17/2012   Cerebral infarction ([place]) 10/30/2011   PFO (patent foramen ovale) 10/30/2011   Bradycardia 10/30/2011   Chest pain 10/30/2011   Hypothyroid    Thrombophlebitis    Sleep apnea    Fibromyalgia    Status post bariatric surgery 12/07/2010   Vein disorder 11/29/2010   S/P total hysterectomy and bilateral salpingo-oophorectomy 11/29/2010   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   S/P cholecystectomy 11/29/2010   S/P ACL surgery 11/29/2010   Morbid obesity ([place]) 11/10/2010    Orientation RESPIRATION BLADDER Height & Weight     Self, Time, Situation, Place  Normal Continent, External catheter Weight: 257 lb 11.5 oz (116.9 kg) Height:  '5\' 4"'$  (162.6 cm)  BEHAVIORAL SYMPTOMS/MOOD NEUROLOGICAL BOWEL NUTRITION STATUS      Continent Diet (See dc summary)  AMBULATORY STATUS COMMUNICATION OF NEEDS Skin   Extensive Assist Verbally Surgical wounds (Closed incision on back)                       Personal Care Assistance Level of Assistance  Bathing, Feeding, Dressing Bathing Assistance: Limited assistance Feeding assistance: Independent Dressing Assistance: Limited assistance     Functional Limitations Info  Sight Sight Info: Impaired        SPECIAL CARE FACTORS FREQUENCY  PT (By licensed PT), OT (By licensed OT)     PT Frequency: 5x/week OT Frequency: 5x/week  Contractures Contractures Info: Not present    Additional Factors Info  Code Status, Allergies, Psychotropic Code Status Info: Full Allergies Info: Lyrica (Pregabalin), Belsomra (Suvorexant), Morphine And Related, Sulfamethoxazole-trimethoprim, Tape Psychotropic Info: Cymbalta         Current Medications (08/23/2021):  This is the current  hospital active medication list Current Facility-Administered Medications  Medication Dose Route Frequency Provider Last Rate Last Admin   0.9 %  sodium chloride infusion  250 mL Intravenous Continuous Jessy Oto, MD       0.9 %  sodium chloride infusion   Intravenous Continuous Jessy Oto, MD   Stopped at 08/23/21 1050   acetaminophen (TYLENOL) tablet 650 mg  650 mg Oral Q4H PRN Jessy Oto, MD       Or   acetaminophen (TYLENOL) suppository 650 mg  650 mg Rectal Q4H PRN Jessy Oto, MD       alum & mag hydroxide-simeth (MAALOX/MYLANTA) 200-200-20 MG/5ML suspension 30 mL  30 mL Oral Q6H PRN Jessy Oto, MD       ascorbic acid (VITAMIN C) tablet 500 mg  500 mg Oral Daily Jessy Oto, MD   500 mg at 08/23/21 1030   baclofen (LIORESAL) tablet 10 mg  10 mg Oral TID Jessy Oto, MD   10 mg at 08/23/21 1607   bisacodyl (DULCOLAX) EC tablet 5 mg  5 mg Oral Daily PRN Jessy Oto, MD       cholecalciferol (VITAMIN D3) 25 MCG (1000 UNIT) tablet 5,000 Units  5,000 Units Oral Daily Jessy Oto, MD   5,000 Units at 08/23/21 1031   cyanocobalamin (VITAMIN B12) tablet 5,000 mcg  5,000 mcg Oral Daily Jessy Oto, MD   5,000 mcg at 08/23/21 1031   diphenhydrAMINE-zinc acetate (BENADRYL) 6-1.6 % cream 1 Application  1 Application Topical Daily PRN Jessy Oto, MD       docusate sodium (COLACE) capsule 100 mg  100 mg Oral BID Jessy Oto, MD   100 mg at 08/23/21 1032   donepezil (ARICEPT) tablet 10 mg  10 mg Oral QHS Jessy Oto, MD   10 mg at 08/22/21 2104   DULoxetine (CYMBALTA) DR capsule 60 mg  60 mg Oral BID Jessy Oto, MD   60 mg at 08/23/21 1032   famotidine (PEPCID) tablet 40 mg  40 mg Oral QHS Jessy Oto, MD   40 mg at 08/22/21 2104   ferrous sulfate tablet 325 mg  325 mg Oral Q supper Jessy Oto, MD   325 mg at 08/23/21 1607   fesoterodine (TOVIAZ) tablet 4 mg  4 mg Oral Daily Jessy Oto, MD   4 mg at 07/37/10 6269   folic acid (FOLVITE) tablet 0.5  mg  500 mcg Oral Daily Jessy Oto, MD   0.5 mg at 08/23/21 1032   gabapentin (NEURONTIN) capsule 300 mg  300 mg Oral TID Jessy Oto, MD   300 mg at 08/23/21 1607   hydrocortisone cream 1 % 1 Application  1 Application Topical Daily PRN Jessy Oto, MD       HYDROmorphone (DILAUDID) injection 0.5 mg  0.5 mg Intravenous Q2H PRN Jessy Oto, MD       ketorolac (TORADOL) 30 MG/ML injection 30 mg  30 mg Intravenous Q8H Jessy Oto, MD   30 mg at 08/23/21 1401   l-methylfolate-B6-B12 (METANX) 3-35-2 MG per tablet 1 tablet  1 tablet Oral QHS  Jessy Oto, MD   1 tablet at 08/22/21 2110   levothyroxine (SYNTHROID) tablet 125 mcg  125 mcg Oral QAC breakfast Jessy Oto, MD   125 mcg at 08/23/21 0535   lubiprostone (AMITIZA) capsule 24 mcg  24 mcg Oral BID WC Jessy Oto, MD   24 mcg at 08/23/21 1032   menthol-cetylpyridinium (CEPACOL) lozenge 3 mg  1 lozenge Oral PRN Jessy Oto, MD       Or   phenol (CHLORASEPTIC) mouth spray 1 spray  1 spray Mouth/Throat PRN Jessy Oto, MD       methocarbamol (ROBAXIN) tablet 500 mg  500 mg Oral Q6H PRN Jessy Oto, MD   500 mg at 08/23/21 0747   Or   methocarbamol (ROBAXIN) 500 mg in dextrose 5 % 50 mL IVPB  500 mg Intravenous Q6H PRN Jessy Oto, MD       methylphenidate (CONCERTA) CR tablet 27 mg  27 mg Oral BID Jessy Oto, MD   27 mg at 08/23/21 1401   mirabegron ER (MYRBETRIQ) tablet 50 mg  50 mg Oral Daily Jessy Oto, MD   50 mg at 08/23/21 1032   multivitamin with minerals tablet 3 tablet  3 tablet Oral Daily Jessy Oto, MD   3 tablet at 08/23/21 1031   neomycin-bacitracin-polymyxin [phone] OINT 1 Application  1 Application Topical Daily PRN Jessy Oto, MD       omega-3 acid ethyl esters (LOVAZA) capsule 1,000 mg  1,000 mg Oral QHS Jessy Oto, MD   1,000 mg at 08/22/21 2104   ondansetron (ZOFRAN) tablet 4 mg  4 mg Oral Q6H PRN Jessy Oto, MD   4 mg at 08/23/21 1310   Or   ondansetron (ZOFRAN)  injection 4 mg  4 mg Intravenous Q6H PRN Jessy Oto, MD   4 mg at 08/22/21 1545   oxyCODONE (Oxy IR/ROXICODONE) immediate release tablet 10 mg  10 mg Oral Q3H PRN Jessy Oto, MD   10 mg at 08/23/21 1607   oxyCODONE (Oxy IR/ROXICODONE) immediate release tablet 5 mg  5 mg Oral Q3H PRN Jessy Oto, MD   5 mg at 08/22/21 2051   oxyCODONE (OXYCONTIN) 12 hr tablet 20 mg  20 mg Oral Q12H Jessy Oto, MD   20 mg at 08/23/21 1031   pantoprazole (PROTONIX) EC tablet 80 mg  80 mg Oral Daily Jessy Oto, MD   80 mg at 08/23/21 1031   polyethylene glycol (MIRALAX / GLYCOLAX) packet 17 g  17 g Oral Daily PRN Jessy Oto, MD       polyvinyl alcohol (LIQUIFILM TEARS) 1.4 % ophthalmic solution 1 drop  1 drop Both Eyes BID Jessy Oto, MD   1 drop at 08/23/21 1034   sodium chloride flush (NS) 0.9 % injection 3 mL  3 mL Intravenous Q12H Jessy Oto, MD   3 mL at 08/22/21 0029   sodium chloride flush (NS) 0.9 % injection 3 mL  3 mL Intravenous PRN Jessy Oto, MD       sodium phosphate (FLEET) 7-19 GM/118ML enema 1 enema  1 enema Rectal Once PRN Jessy Oto, MD       topiramate (TOPAMAX) tablet 100 mg  100 mg Oral BID Jessy Oto, MD   100 mg at 08/23/21 1033   traZODone (DESYREL) tablet 50 mg  50 mg Oral QHS Jessy Oto, MD  50 mg at 08/22/21 2104     Discharge Medications: Please see discharge summary for a list of discharge medications.  Relevant Imaging Results:  Relevant Lab Results:   Additional Information SSN: [place]  Benard Halsted, LCSW

## 2021-08-23 NOTE — Progress Notes (Signed)
     Subjective: 2 Days Post-Op Procedure(s) (LRB): Removal of the spinal cord stimulator and thoracolumbar fusion T10 to S1 with Transforaminal Lumbar Interbody Fusions left L1-2, L2-3, L3-4 and L4-5, bilateral L5-S1 foramenotomies and left T10-11 foramenotomy this would be done with rods, screws and cages with local bone graft and allograft, Vivigen (N/A)Awake, alert and oriented x 4. Dressing is dry, Hemovac intact and discontinued today. Voiding, wick urinary catheter discontinued. Up with PT in the hallway.   Patient reports pain as moderate.    Objective:   VITALS:  Temp:  [98.4 F (36.9 C)-99.7 F (37.6 C)] 98.7 F (37.1 C) (08/02 1559) Pulse Rate:  [83-92] 92 (08/02 1559) Resp:  [18-20] 18 (08/02 1559) BP: (96-143)/(57-67) 96/58 (08/02 1559) SpO2:  [93 %-98 %] 98 % (08/02 1559)  Neurologically intact ABD soft Neurovascular intact Sensation intact distally Intact pulses distally Dorsiflexion/Plantar flexion intact Incision: dressing C/D/I, no drainage, and hemovac discontinued.    LABS Recent Labs    08/21/21 1144 08/21/21 1520 08/21/21 1809 08/22/21 0647 08/23/21 0558  HGB 10.9* 10.9* 11.9* 10.0* 8.3*  WBC  --   --   --  8.5 8.0  PLT  --   --   --  143* 153   Recent Labs    08/22/21 0647 08/23/21 0558  NA 139 139  K 3.7 3.2*  CL 111 113*  CO2 21* 22  BUN 7 7  CREATININE 0.76 0.80  GLUCOSE 140* 130*   No results for input(s): "LABPT", "INR" in the last 72 hours.   Assessment/Plan: 2 Days Post-Op Procedure(s) (LRB): Removal of the spinal cord stimulator and thoracolumbar fusion T10 to S1 with Transforaminal Lumbar Interbody Fusions left L1-2, L2-3, L3-4 and L4-5, bilateral L5-S1 foramenotomies and left T10-11 foramenotomy this would be done with rods, screws and cages with local bone graft and allograft, Vivigen (N/A) Hypokalemia, likely related to fluid rescusitation without IV potassium supplements.  Not taking much orally  Advance diet Up with  therapy D/C IV fluids Discharge to SNF when stable Recheck H/H Will start ferrous gluconate when tolerating po nourishment. Recheck KCl  [name] 08/23/2021, 5:19 PM Patient ID: Debbie Pineda, female   DOB: October 26, 1960, 61 y.o.   MRN: [id number]

## 2021-08-24 LAB — CBC WITH DIFFERENTIAL/PLATELET
Abs Immature Granulocytes: 0.11 10*3/uL — ABNORMAL HIGH (ref 0.00–0.07)
Basophils Absolute: 0 10*3/uL (ref 0.0–0.1)
Basophils Relative: 0 %
Eosinophils Absolute: 0.1 10*3/uL (ref 0.0–0.5)
Eosinophils Relative: 2 %
HCT: 21.8 % — ABNORMAL LOW (ref 36.0–46.0)
Hemoglobin: 7.3 g/dL — ABNORMAL LOW (ref 12.0–15.0)
Immature Granulocytes: 1 %
Lymphocytes Relative: 27 %
Lymphs Abs: 2.1 10*3/uL (ref 0.7–4.0)
MCH: 32.3 pg (ref 26.0–34.0)
MCHC: 33.5 g/dL (ref 30.0–36.0)
MCV: 96.5 fL (ref 80.0–100.0)
Monocytes Absolute: 0.6 10*3/uL (ref 0.1–1.0)
Monocytes Relative: 8 %
Neutro Abs: 4.9 10*3/uL (ref 1.7–7.7)
Neutrophils Relative %: 62 %
Platelets: 156 10*3/uL (ref 150–400)
RBC: 2.26 MIL/uL — ABNORMAL LOW (ref 3.87–5.11)
RDW: 12.5 % (ref 11.5–15.5)
WBC: 7.9 10*3/uL (ref 4.0–10.5)
nRBC: 0.4 % — ABNORMAL HIGH (ref 0.0–0.2)

## 2021-08-24 LAB — PREPARE RBC (CROSSMATCH)

## 2021-08-24 LAB — BASIC METABOLIC PANEL
Anion gap: 6 (ref 5–15)
BUN: 8 mg/dL (ref 6–20)
CO2: 22 mmol/L (ref 22–32)
Calcium: 8.1 mg/dL — ABNORMAL LOW (ref 8.9–10.3)
Chloride: 111 mmol/L (ref 98–111)
Creatinine, Ser: 0.75 mg/dL (ref 0.44–1.00)
GFR, Estimated: >60 mL/min (ref >60)
Glucose, Bld: 102 mg/dL — ABNORMAL HIGH (ref 70–99)
Potassium: 3.9 mmol/L (ref 3.5–5.1)
Sodium: 139 mmol/L (ref 135–145)

## 2021-08-24 LAB — HEMOGLOBIN AND HEMATOCRIT, BLOOD
HCT: 29.7 % — ABNORMAL LOW (ref 36.0–46.0)
Hemoglobin: 10.1 g/dL — ABNORMAL LOW (ref 12.0–15.0)

## 2021-08-24 MED ORDER — SODIUM CHLORIDE 0.9% IV SOLUTION: Freq: Once | INTRAVENOUS | Status: AC

## 2021-08-24 MED ORDER — FUROSEMIDE 10 MG/ML IJ SOLN
20.0000 mg | Freq: Once | INTRAMUSCULAR | Status: AC
Start: 2021-08-24 — End: 2021-08-24
  Administered 2021-08-24: 20 mg via INTRAVENOUS
  Filled 2021-08-24: qty 2

## 2021-08-24 MED ORDER — ACETAMINOPHEN 325 MG PO TABS
650.0000 mg | ORAL_TABLET | Freq: Once | ORAL | Status: AC
Start: 2021-08-24 — End: 2021-08-24
  Administered 2021-08-24: 650 mg via ORAL
  Filled 2021-08-24: qty 2

## 2021-08-24 MED ORDER — DIPHENHYDRAMINE HCL 25 MG PO CAPS
25.0000 mg | ORAL_CAPSULE | Freq: Once | ORAL | Status: AC
  Administered 2021-08-24: 25 mg via ORAL
  Filled 2021-08-24: qty 1

## 2021-08-24 NOTE — Progress Notes (Signed)
PT Cancellation Note  Patient Details Name: Debbie Pineda MRN: [id number] DOB: 04/17/1960   Cancelled Treatment:    Reason Eval/Treat Not Completed: Pain limiting ability to participate at this time. Spoke with RN about pain medicine, will continue to check in throughout day to treat as tolerated.   [place], PT, DPT   Acute Rehabilitation Department   [name] 08/24/2021, 10:07 AM

## 2021-08-24 NOTE — Progress Notes (Signed)
OT Cancellation Note  Patient Details Name: Debbie Pineda MRN: [id number] DOB: [birth year]   Cancelled Treatment:    Reason Eval/Treat Not Completed: Medical issues which prohibited therapy- per RN remains in pain and receiving 1st unit of PRBCs.  Will follow and see as able.   Debbie Pineda, OT Acute Rehabilitation Services Office [phone]   Delight Stare 08/24/2021, 11:28 AM

## 2021-08-24 NOTE — Progress Notes (Signed)
Physical Therapy Treatment Patient Details Name: Debbie Pineda MRN: [id number] DOB: 10-03-1960 Today's Date: 08/24/2021   History of Present Illness Patient is a 61 y/o female who presents on 08/21/21 s/p thoracolumbar fusion T10-S1 and TLIF L1-5 and bilateral L5-S1. Noted in MD note - pt on significant pain meds prior to admission and is likely opioid tolerant.  PMH includes CVA, factor V Leiden, chronic pain syndrome, cognitive impairment, DVT, ADD, PFO, OSA on CPAP.    PT Comments    The pt presents with lethargy and flat affect, but is agreeable to session despite reports of continued pain. The pt was able to complete bed mobility without assist, but does require close minG for safety with transfers and gait. Pt was able to progress distance ambulated, but continues to utilize slowed speed and needs increased cues for positioning in RW and posture with mobility. Will continue to benefit from skilled PT to pogress functional endurance, stability, and complete stair training prior to d/c.    Recommendations for follow up therapy are one component of a multi-disciplinary discharge planning process, led by the attending physician.  Recommendations may be updated based on patient status, additional functional criteria and insurance authorization.  Follow Up Recommendations  Home health PT Can patient physically be transported by private vehicle: Yes   Assistance Recommended at Discharge Intermittent Supervision/Assistance  Patient can return home with the following A little help with walking and/or transfers;A little help with bathing/dressing/bathroom;Assistance with cooking/housework;Help with stairs or ramp for entrance   Equipment Recommendations  None recommended by PT (has RW and rollator)    Recommendations for Other Services       Precautions / Restrictions Precautions Precautions: Back;Fall Precaution Booklet Issued: Yes (comment) Precaution Comments: reviewed with pt, pt  unable to recall more than "twisting" Required Braces or Orthoses: Spinal Brace Spinal Brace: Thoracolumbosacral orthotic;Applied in sitting position Restrictions Weight Bearing Restrictions: No     Mobility  Bed Mobility Overal bed mobility: Needs Assistance Bed Mobility: Rolling, Sidelying to Sit Rolling: Supervision Sidelying to sit: Supervision       General bed mobility comments: supervision with cues for positioning    Transfers Overall transfer level: Needs assistance Equipment used: Rolling walker (2 wheels) Transfers: Sit to/from Stand Sit to Stand: Min guard           General transfer comment: minG with increased time to rise    Ambulation/Gait Ambulation/Gait assistance: Min guard Gait Distance (Feet): 200 Feet Assistive device: Rolling walker (2 wheels) Gait Pattern/deviations: Step-through pattern, Decreased stride length, Trunk flexed Gait velocity: decreased Gait velocity interpretation: 1.31 - 2.62 ft/sec, indicative of limited community ambulator   General Gait Details: steady gait, cues for positioning in RW and for positioning in hallway. minG for safety but no overt LOB      Balance Overall balance assessment: Needs assistance Sitting-balance support: No upper extremity supported, Feet supported Sitting balance-Leahy Scale: Good Sitting balance - Comments: min assist without UE support, min guard with 1 UE support   Standing balance support: Bilateral upper extremity supported, Reliant on assistive device for balance Standing balance-Leahy Scale: Poor Standing balance comment: BUE support for safety and pain control.                            Cognition Arousal/Alertness: Awake/alert Behavior During Therapy: WFL for tasks assessed/performed, Flat affect Overall Cognitive Status: Impaired/Different from baseline Area of Impairment: Memory, Safety/judgement, Problem solving  Memory: Decreased recall  of precautions, Decreased short-term memory   Safety/Judgement: Decreased awareness of safety, Decreased awareness of deficits   Problem Solving: Slow processing, Decreased initiation, Difficulty sequencing, Requires verbal cues General Comments: needing verbal cues for precautions and how to sequence brace        Exercises      General Comments General comments (skin integrity, edema, etc.): VSS on RA, pt getting blood during session      Pertinent Vitals/Pain Pain Assessment Pain Assessment: Faces Faces Pain Scale: Hurts little more Pain Location: back Pain Descriptors / Indicators: Discomfort, Sore, Operative site guarding, Sharp (pt agrees to all pain descriptors offered by PT) Pain Intervention(s): Limited activity within patient's tolerance, Monitored during session, Repositioned, Premedicated before session     PT Goals (current goals can now be found in the care plan section) Acute Rehab PT Goals Patient Stated Goal: per spouse, to go to rehab PT Goal Formulation: With family Time For Goal Achievement: 09/05/21 Potential to Achieve Goals: Fair Progress towards PT goals: Progressing toward goals    Frequency    Min 5X/week      PT Plan Current plan remains appropriate       AM-PAC PT "6 Clicks" Mobility   Outcome Measure  Help needed turning from your back to your side while in a flat bed without using bedrails?: A Little Help needed moving from lying on your back to sitting on the side of a flat bed without using bedrails?: A Little Help needed moving to and from a bed to a chair (including a wheelchair)?: A Little Help needed standing up from a chair using your arms (e.g., wheelchair or bedside chair)?: A Little Help needed to walk in hospital room?: A Little Help needed climbing 3-5 steps with a railing? : A Little 6 Click Score: 18    End of Session Equipment Utilized During Treatment: Gait belt;Back brace Activity Tolerance: Patient tolerated  treatment well Patient left: in bed;with call bell/phone within reach;with bed alarm set;with family/visitor present Nurse Communication: Mobility status PT Visit Diagnosis: Pain;Muscle weakness (generalized) (M62.81);Difficulty in walking, not elsewhere classified (R26.2)     Time: 7654-6503 PT Time Calculation (min) (ACUTE ONLY): 23 min  Charges:  $Gait Training: 8-22 mins $Therapeutic Activity: 8-22 mins                     West Carbo, PT, DPT   Acute Rehabilitation Department   [name] 08/24/2021, 3:26 PM

## 2021-08-25 LAB — CBC WITH DIFFERENTIAL/PLATELET
Abs Immature Granulocytes: 0.16 10*3/uL — ABNORMAL HIGH (ref 0.00–0.07)
Basophils Absolute: 0 10*3/uL (ref 0.0–0.1)
Basophils Relative: 1 %
Eosinophils Absolute: 0.2 10*3/uL (ref 0.0–0.5)
Eosinophils Relative: 2 %
HCT: 29.5 % — ABNORMAL LOW (ref 36.0–46.0)
Hemoglobin: 10.3 g/dL — ABNORMAL LOW (ref 12.0–15.0)
Immature Granulocytes: 2 %
Lymphocytes Relative: 24 %
Lymphs Abs: 1.9 10*3/uL (ref 0.7–4.0)
MCH: 32.1 pg (ref 26.0–34.0)
MCHC: 34.9 g/dL (ref 30.0–36.0)
MCV: 91.9 fL (ref 80.0–100.0)
Monocytes Absolute: 0.6 10*3/uL (ref 0.1–1.0)
Monocytes Relative: 7 %
Neutro Abs: 5.1 10*3/uL (ref 1.7–7.7)
Neutrophils Relative %: 64 %
Platelets: 204 10*3/uL (ref 150–400)
RBC: 3.21 MIL/uL — ABNORMAL LOW (ref 3.87–5.11)
RDW: 14.3 % (ref 11.5–15.5)
WBC: 8 10*3/uL (ref 4.0–10.5)
nRBC: 0 % (ref 0.0–0.2)

## 2021-08-25 LAB — BPAM RBC
Blood Product Expiration Date: 202308232359
Blood Product Expiration Date: 202308232359
ISSUE DATE / TIME: 202308031058
ISSUE DATE / TIME: 202308031344
Unit Type and Rh: 7300
Unit Type and Rh: 7300

## 2021-08-25 LAB — BASIC METABOLIC PANEL
Anion gap: 4 — ABNORMAL LOW (ref 5–15)
BUN: 10 mg/dL (ref 6–20)
CO2: 26 mmol/L (ref 22–32)
Calcium: 8.3 mg/dL — ABNORMAL LOW (ref 8.9–10.3)
Chloride: 110 mmol/L (ref 98–111)
Creatinine, Ser: 0.86 mg/dL (ref 0.44–1.00)
GFR, Estimated: >60 mL/min (ref >60)
Glucose, Bld: 95 mg/dL (ref 70–99)
Potassium: 4.2 mmol/L (ref 3.5–5.1)
Sodium: 140 mmol/L (ref 135–145)

## 2021-08-25 LAB — TYPE AND SCREEN
ABO/RH(D): B POS
Antibody Screen: NEGATIVE
Unit division: 0
Unit division: 0

## 2021-08-25 MED ORDER — RIVAROXABAN 20 MG PO TABS
20.0000 mg | ORAL_TABLET | Freq: Every day | ORAL | Status: DC
  Administered 2021-08-25 – 2021-08-26 (×2): 20 mg via ORAL
  Filled 2021-08-25 (×2): qty 1

## 2021-08-25 MED ORDER — FLEET ENEMA 7-19 GM/118ML RE ENEM
1.0000 | ENEMA | Freq: Every day | RECTAL | Status: DC | PRN
  Administered 2021-08-25 – 2021-08-26 (×2): 1 via RECTAL
  Filled 2021-08-25 (×2): qty 1

## 2021-08-25 MED ORDER — FERROUS GLUCONATE 324 (38 FE) MG PO TABS
324.0000 mg | ORAL_TABLET | Freq: Two times a day (BID) | ORAL | Status: DC
  Administered 2021-08-25 – 2021-08-27 (×5): 324 mg via ORAL
  Filled 2021-08-25 (×6): qty 1

## 2021-08-25 MED ORDER — MAGNESIUM HYDROXIDE 400 MG/5ML PO SUSP
30.0000 mL | Freq: Every day | ORAL | Status: DC | PRN
Start: 2021-08-25 — End: 2021-08-27

## 2021-08-25 MED FILL — Heparin Sodium (Porcine) Inj 1000 Unit/ML: INTRAMUSCULAR | Qty: 30 | Status: AC

## 2021-08-25 MED FILL — Sodium Chloride IV Soln 0.9%: INTRAVENOUS | Qty: 2000 | Status: AC

## 2021-08-25 MED FILL — Sodium Chloride Irrigation Soln 0.9%: Qty: 3000 | Status: AC

## 2021-08-25 NOTE — Progress Notes (Signed)
Occupational Therapy Treatment Patient Details Name: Debbie Pineda MRN: [id number] DOB: 02-28-1960 Today's Date: 08/25/2021   History of present illness Patient is a 61 y/o female who presents on 08/21/21 s/p thoracolumbar fusion T10-S1 and TLIF L1-5 and bilateral L5-S1. Noted in MD note - pt on significant pain meds prior to admission and is likely opioid tolerant.  PMH includes CVA, factor V Leiden, chronic pain syndrome, cognitive impairment, DVT, ADD, PFO, OSA on CPAP.   OT comments  Pt progressing well towards OT goals.  Completing bed mobility with supervision, toilet transfers and functional mobility in room using RW with min guard, toileting with min assist.  Reviewed brace mgmt and requires min assist to don, mod assist for LB dressing simulated.  Requires cueing to recall precautions, recalled 2/3 with min cueing but adheres well during mobility/ADls. Plans to dc to [place] rehab to optimize independence and safety.     Recommendations for follow up therapy are one component of a multi-disciplinary discharge planning process, led by the attending physician.  Recommendations may be updated based on patient status, additional functional criteria and insurance authorization.    Follow Up Recommendations  Skilled nursing-short term rehab (<3 hours/day)    Assistance Recommended at Discharge Frequent or constant Supervision/Assistance  Patient can return home with the following  Assistance with cooking/housework;Assist for transportation;Help with stairs or ramp for entrance;A little help with walking and/or transfers;A lot of help with bathing/dressing/bathroom   Equipment Recommendations  Other (comment) (defer)    Recommendations for Other Services      Precautions / Restrictions Precautions Precautions: Back;Fall Precaution Booklet Issued: Yes (comment) Precaution Comments: reviewed with pt, recall 2/3 (assist to recall bending) Required Braces or Orthoses: Spinal  Brace Spinal Brace: Thoracolumbosacral orthotic;Applied in sitting position Restrictions Weight Bearing Restrictions: No       Mobility Bed Mobility Overal bed mobility: Needs Assistance Bed Mobility: Rolling, Sidelying to Sit Rolling: Supervision Sidelying to sit: Supervision     Sit to sidelying: Supervision General bed mobility comments: supervision with cues for positioning    Transfers Overall transfer level: Needs assistance Equipment used: Rolling walker (2 wheels) Transfers: Sit to/from Stand Sit to Stand: Min guard           General transfer comment: minG with increased time to rise     Balance Overall balance assessment: Needs assistance Sitting-balance support: No upper extremity supported, Feet supported Sitting balance-Leahy Scale: Good Sitting balance - Comments: min g for safety   Standing balance support: Bilateral upper extremity supported, During functional activity Standing balance-Leahy Scale: Poor Standing balance comment: relies on UE support                           ADL either performed or assessed with clinical judgement   ADL Overall ADL's : Needs assistance/impaired     Grooming: Set up;Sitting           Upper Body Dressing : Minimal assistance;Sitting Upper Body Dressing Details (indicate cue type and reason): to don brace Lower Body Dressing: Moderate assistance;Sit to/from stand   Toilet Transfer: Min guard;Ambulation;Rolling walker (2 wheels)   Toileting- Clothing Manipulation and Hygiene: Minimal assistance;Sitting/lateral lean;Sit to/from stand Toileting - Clothing Manipulation Details (indicate cue type and reason): spouse assists for thoroughness     Functional mobility during ADLs: Minimal assistance;Rolling walker (2 wheels)      Extremity/Trunk Assessment  Vision       Perception     Praxis      Cognition Arousal/Alertness: Awake/alert Behavior During Therapy: WFL for tasks  assessed/performed, Flat affect Overall Cognitive Status: Impaired/Different from baseline Area of Impairment: Memory, Safety/judgement, Problem solving                     Memory: Decreased recall of precautions, Decreased short-term memory   Safety/Judgement: Decreased awareness of safety, Decreased awareness of deficits   Problem Solving: Slow processing, Decreased initiation, Difficulty sequencing, Requires verbal cues General Comments: recall 2/3 precautions, cueing to recall how to don brace. good adherance of precautions functionally.        Exercises      Shoulder Instructions       General Comments VSS, spouse supportive    Pertinent Vitals/ Pain       Pain Assessment Pain Assessment: Faces Faces Pain Scale: Hurts little more Pain Location: back Pain Descriptors / Indicators: Discomfort, Sore, Operative site guarding, Sharp Pain Intervention(s): Limited activity within patient's tolerance, Monitored during session, Repositioned  Home Living                                          Prior Functioning/Environment              Frequency  Min 2X/week        Progress Toward Goals  OT Goals(current goals can now be found in the care plan section)  Progress towards OT goals: Progressing toward goals  Acute Rehab OT Goals Patient Stated Goal: get to rehab OT Goal Formulation: With patient Time For Goal Achievement: 09/05/21 Potential to Achieve Goals: [place] Discharge plan remains appropriate;Frequency remains appropriate    Co-evaluation                 AM-PAC OT "6 Clicks" Daily Activity     Outcome Measure   Help from another person eating meals?: A Little Help from another person taking care of personal grooming?: A Little Help from another person toileting, which includes using toliet, bedpan, or urinal?: A Little Help from another person bathing (including washing, rinsing, drying)?: A Lot Help from  another person to put on and taking off regular upper body clothing?: A Little Help from another person to put on and taking off regular lower body clothing?: A Lot 6 Click Score: 16    End of Session Equipment Utilized During Treatment: Rolling walker (2 wheels);Back brace  OT Visit Diagnosis: Other abnormalities of gait and mobility (R26.89);Muscle weakness (generalized) (M62.81);Pain Pain - part of body:  (back)   Activity Tolerance Patient tolerated treatment well   Patient Left in bed;with call bell/phone within reach;with family/visitor present   Nurse Communication Mobility status        Time: 9798-9211 OT Time Calculation (min): 21 min  Charges: OT General Charges $OT Visit: 1 Visit OT Treatments $Self Care/Home Management : 8-22 mins  Jolaine Artist, OT Acute Rehabilitation Services Office [place] 08/25/2021, 2:20 PM

## 2021-08-25 NOTE — Discharge Summary (Signed)
Physician Discharge Summary      Patient ID: Debbie Pineda MRN: [id number] DOB/AGE: August 10, 1960 61 y.o.  Admit date: 08/21/2021 Discharge date: 08/26/2021  Admission Diagnoses:  Principal Problem:   Status post lumbar spinal fusion Anemia due to expected blood loss from surgery, acute blood loss. Hypokalemia  Discharge Diagnoses:  Same  Past Medical History:  Diagnosis Date   ADD (attention deficit disorder)    Arthritis    "all over"   Cerebral infarction ([place]) 10/30/2011   Cerebrovascular disease 08/14/2016   Chronic back pain    "all over"   Chronic low back pain 40/97/3532   Complication of anesthesia    tends to have hypotension when NPO and post-anesthesia   Constipation    takes stool softener daily   Degenerative disk disease    Degenerative joint disease    DVT (deep venous thrombosis) ([place]) 2014   RLE   Family history of adverse reaction to anesthesia    a family member woke up during surgery; "think it was my mom"   Fibromyalgia    Generalized osteoarthritis of multiple sites 11/04/2013   GERD (gastroesophageal reflux disease)    Headache    Heart palpitations 12/14/2013   resolved   History of blood clots    superficial   Hypoglycemia    Hypothyroid    takes Synthroid daily   Incomplete emptying of bladder    Insomnia    takes Trazodone nightly   Iron deficiency anemia    takes Ferrous Sulfate daily   Joint pain    Joint swelling    knees and ankles   Memory disorder 08/14/2016   Morbid obesity ([place])    Nausea    takes Zofran as needed.Seeing GI doc   Neck pain 05/25/2016   OSA on CPAP    tested more than 5 yrs ago.     Osteoarthritis    Osteopenia    in feet   PFO (patent foramen ovale)    no murmur   Pre-diabetes    Primary osteoarthritis of both feet 05/29/2016   Right bunionectomy August 2017 by Dr. Sharol Given   Scoliosis    Skin abnormality 02/04/2020   raised area on lip   Sleep apnea    mild osa per pt   Stroke [facility])  "several"last 2014   right foot weakness; memory issues, black spot right visual field since" (03/23/2015)   Thrombophlebitis    Trochanteric bursitis of both hips 05/25/2016   Unilateral primary osteoarthritis, right knee 05/29/2016   Urinary urgency 04/19/2016   Vein disorder 11/29/2010   varicose veins both legs   Wears glasses     Surgeries: Procedure(s): Removal of the spinal cord stimulator and thoracolumbar fusion T10 to S1 with Transforaminal Lumbar Interbody Fusions left L1-2, L2-3, L3-4 and L4-5, bilateral L5-S1 foramenotomies and left T10-11 foramenotomy this would be done with rods, screws and cages with local bone graft and allograft, Vivigen on 08/21/2021   Consultants:   Discharged Condition: Improved  Hospital Course: ANAYI BRICCO is an 61 y.o. female who was admitted 08/21/2021 with a chief complaint of No chief complaint on file. , and found to have a diagnosis of Status post lumbar spinal fusion.  They were brought to the operating room on 08/21/2021 and underwent the above named procedures.    They were given perioperative antibiotics:  Anti-infectives (From admission, onward)    Start     Dose/Rate Route Frequency Ordered Stop   08/22/21 0000  ceFAZolin (ANCEF)  IVPB 2g/100 mL premix        2 g 200 mL/hr over 30 Minutes Intravenous Every 8 hours 08/21/21 2306 08/22/21 0847   08/21/21 1700  vancomycin (VANCOCIN) powder  Status:  Discontinued          As needed 08/21/21 1700 08/21/21 1847   08/21/21 0630  ceFAZolin (ANCEF) IVPB 2g/100 mL premix        2 g 200 mL/hr over 30 Minutes Intravenous On call to O.R. 08/21/21 2706 08/21/21 1638     Vancomycin was instilled in the incision at the end of the surgical procedure and hemovac drain. Was extubated and recovered in the PACU and was then transferred to 4 NP room 8 for continued post operative care. Dressing remained intact and hemovac drain was kept in  Place for 2 days to allow for drainage from large surgical  field and vancomycin affect on incision. She had severe post operative pain, was opioid tolerant due to preoperative medication. Her indwelling spinal cord stimulator was removed at time of surgery. POD#1 she did poorly with PT and OT and her narcotic meds were adjusted to relieve her pain and she was then able to participate with therapy on POD#2. Her foley was discontinued on POD#1 and a wick catheter used for an additional one day before she was able to ambulate with assistance to the bathroom.  Hgb was 10-10-8 on POD 1-2 and showed gradual decrease to a hemoglobin of 7.1 on POD#3. She was transfused 2 units of PRBCs due to decreasing hgb, need to participate with therapy and need to reinstitue use of xarelto for a patent foramen ovale of the heart with history of previous emobilc phenomena resulting in minor strokes in the past.  POD#4 Hgb was 10.1 post transfusion. Started on Ferrous gluconate and BM was to be stimulated with oral and pr laxatives.  She is alert and oriented x 4. Able to ambulate in the hallway with brace, walker with wheels and assistance. She is tolerating po narcotics an nourishment. KCL required some replacement on POD#3 and stablized. Xarelto was restarted on POD#4 and laboratory test CBC and BMET performed on POD#5. She has no help at home due to husband being unable to lift her and husband working so that she requested SNF placement. A SNF placement was undertaken . POD#5 CBC is stable with xarelto started, no leukocytosis. BMET is normal with K 4.2. No KCl supplements long term. Dressing is dry and was changed today. No hematoma or fluctuance. Motor is intact and normal. Transfer to SNF will occur when insurance authorization is complete and transportation has been arranged.  They were given sequential compression devices, early ambulation, and chemoprophylaxis for DVT prophylaxis.  They benefited maximally from their hospital stay and there were no complications.    Recent  vital signs:  Vitals:   08/26/21 0800 08/26/21 0900  BP: (!) 120/45 (!) 113/47  Pulse: 75   Resp: 18   Temp: 97.8 F (36.6 C)   SpO2: 94%     Recent laboratory studies:  Results for orders placed or performed during the hospital encounter of 08/21/21  Glucose, capillary  Result Value Ref Range   Glucose-Capillary 124 (H) 70 - 99 mg/dL  CBC  Result Value Ref Range   WBC 8.5 4.0 - 10.5 K/uL   RBC 3.08 (L) 3.87 - 5.11 MIL/uL   Hemoglobin 10.0 (L) 12.0 - 15.0 g/dL   HCT 29.4 (L) 36.0 - 46.0 %   MCV 95.5 80.0 -  100.0 fL   MCH 32.5 26.0 - 34.0 pg   MCHC 34.0 30.0 - 36.0 g/dL   RDW 12.1 11.5 - 15.5 %   Platelets 143 (L) 150 - 400 K/uL   nRBC 0.0 0.0 - 0.2 %  Basic metabolic panel  Result Value Ref Range   Sodium 139 135 - 145 mmol/L   Potassium 3.7 3.5 - 5.1 mmol/L   Chloride 111 98 - 111 mmol/L   CO2 21 (L) 22 - 32 mmol/L   Glucose, Bld 140 (H) 70 - 99 mg/dL   BUN 7 6 - 20 mg/dL   Creatinine, Ser 0.76 0.44 - 1.00 mg/dL   Calcium 7.9 (L) 8.9 - 10.3 mg/dL   GFR, Estimated >60 >60 mL/min   Anion gap 7 5 - 15  CBC with Differential/Platelet  Result Value Ref Range   WBC 8.0 4.0 - 10.5 K/uL   RBC 2.63 (L) 3.87 - 5.11 MIL/uL   Hemoglobin 8.3 (L) 12.0 - 15.0 g/dL   HCT 24.5 (L) 36.0 - 46.0 %   MCV 93.2 80.0 - 100.0 fL   MCH 31.6 26.0 - 34.0 pg   MCHC 33.9 30.0 - 36.0 g/dL   RDW 12.3 11.5 - 15.5 %   Platelets 153 150 - 400 K/uL   nRBC 0.0 0.0 - 0.2 %   Neutrophils Relative % 71 %   Neutro Abs 5.6 1.7 - 7.7 K/uL   Lymphocytes Relative 19 %   Lymphs Abs 1.6 0.7 - 4.0 K/uL   Monocytes Relative 9 %   Monocytes Absolute 0.7 0.1 - 1.0 K/uL   Eosinophils Relative 0 %   Eosinophils Absolute 0.0 0.0 - 0.5 K/uL   Basophils Relative 0 %   Basophils Absolute 0.0 0.0 - 0.1 K/uL   Immature Granulocytes 1 %   Abs Immature Granulocytes 0.07 0.00 - 0.07 K/uL  Basic metabolic panel  Result Value Ref Range   Sodium 139 135 - 145 mmol/L   Potassium 3.2 (L) 3.5 - 5.1 mmol/L    Chloride 113 (H) 98 - 111 mmol/L   CO2 22 22 - 32 mmol/L   Glucose, Bld 130 (H) 70 - 99 mg/dL   BUN 7 6 - 20 mg/dL   Creatinine, Ser 0.80 0.44 - 1.00 mg/dL   Calcium 7.9 (L) 8.9 - 10.3 mg/dL   GFR, Estimated >60 >60 mL/min   Anion gap 4 (L) 5 - 15  Basic metabolic panel  Result Value Ref Range   Sodium 139 135 - 145 mmol/L   Potassium 3.9 3.5 - 5.1 mmol/L   Chloride 111 98 - 111 mmol/L   CO2 22 22 - 32 mmol/L   Glucose, Bld 102 (H) 70 - 99 mg/dL   BUN 8 6 - 20 mg/dL   Creatinine, Ser 0.75 0.44 - 1.00 mg/dL   Calcium 8.1 (L) 8.9 - 10.3 mg/dL   GFR, Estimated >60 >60 mL/min   Anion gap 6 5 - 15  CBC with Differential/Platelet  Result Value Ref Range   WBC 7.9 4.0 - 10.5 K/uL   RBC 2.26 (L) 3.87 - 5.11 MIL/uL   Hemoglobin 7.3 (L) 12.0 - 15.0 g/dL   HCT 21.8 (L) 36.0 - 46.0 %   MCV 96.5 80.0 - 100.0 fL   MCH 32.3 26.0 - 34.0 pg   MCHC 33.5 30.0 - 36.0 g/dL   RDW 12.5 11.5 - 15.5 %   Platelets 156 150 - 400 K/uL   nRBC 0.4 (H) 0.0 - 0.2 %  Neutrophils Relative % 62 %   Neutro Abs 4.9 1.7 - 7.7 K/uL   Lymphocytes Relative 27 %   Lymphs Abs 2.1 0.7 - 4.0 K/uL   Monocytes Relative 8 %   Monocytes Absolute 0.6 0.1 - 1.0 K/uL   Eosinophils Relative 2 %   Eosinophils Absolute 0.1 0.0 - 0.5 K/uL   Basophils Relative 0 %   Basophils Absolute 0.0 0.0 - 0.1 K/uL   Immature Granulocytes 1 %   Abs Immature Granulocytes 0.11 (H) 0.00 - 0.07 K/uL  Hemoglobin and hematocrit, blood  Result Value Ref Range   Hemoglobin 10.1 (L) 12.0 - 15.0 g/dL   HCT 29.7 (L) 36.0 - 46.0 %  CBC with Differential/Platelet  Result Value Ref Range   WBC 8.0 4.0 - 10.5 K/uL   RBC 3.21 (L) 3.87 - 5.11 MIL/uL   Hemoglobin 10.3 (L) 12.0 - 15.0 g/dL   HCT 29.5 (L) 36.0 - 46.0 %   MCV 91.9 80.0 - 100.0 fL   MCH 32.1 26.0 - 34.0 pg   MCHC 34.9 30.0 - 36.0 g/dL   RDW 14.3 11.5 - 15.5 %   Platelets 204 150 - 400 K/uL   nRBC 0.0 0.0 - 0.2 %   Neutrophils Relative % 64 %   Neutro Abs 5.1 1.7 - 7.7 K/uL    Lymphocytes Relative 24 %   Lymphs Abs 1.9 0.7 - 4.0 K/uL   Monocytes Relative 7 %   Monocytes Absolute 0.6 0.1 - 1.0 K/uL   Eosinophils Relative 2 %   Eosinophils Absolute 0.2 0.0 - 0.5 K/uL   Basophils Relative 1 %   Basophils Absolute 0.0 0.0 - 0.1 K/uL   Immature Granulocytes 2 %   Abs Immature Granulocytes 0.16 (H) 0.00 - 0.07 K/uL  Basic metabolic panel  Result Value Ref Range   Sodium 140 135 - 145 mmol/L   Potassium 4.2 3.5 - 5.1 mmol/L   Chloride 110 98 - 111 mmol/L   CO2 26 22 - 32 mmol/L   Glucose, Bld 95 70 - 99 mg/dL   BUN 10 6 - 20 mg/dL   Creatinine, Ser 0.86 0.44 - 1.00 mg/dL   Calcium 8.3 (L) 8.9 - 10.3 mg/dL   GFR, Estimated >60 >60 mL/min   Anion gap 4 (L) 5 - 15  CBC with Differential/Platelet  Result Value Ref Range   WBC 7.5 4.0 - 10.5 K/uL   RBC 3.34 (L) 3.87 - 5.11 MIL/uL   Hemoglobin 10.4 (L) 12.0 - 15.0 g/dL   HCT 31.2 (L) 36.0 - 46.0 %   MCV 93.4 80.0 - 100.0 fL   MCH 31.1 26.0 - 34.0 pg   MCHC 33.3 30.0 - 36.0 g/dL   RDW 14.1 11.5 - 15.5 %   Platelets 259 150 - 400 K/uL   nRBC 0.0 0.0 - 0.2 %   Neutrophils Relative % 50 %   Neutro Abs 3.9 1.7 - 7.7 K/uL   Lymphocytes Relative 34 %   Lymphs Abs 2.5 0.7 - 4.0 K/uL   Monocytes Relative 7 %   Monocytes Absolute 0.5 0.1 - 1.0 K/uL   Eosinophils Relative 4 %   Eosinophils Absolute 0.3 0.0 - 0.5 K/uL   Basophils Relative 1 %   Basophils Absolute 0.1 0.0 - 0.1 K/uL   Immature Granulocytes 4 %   Abs Immature Granulocytes 0.26 (H) 0.00 - 0.07 K/uL  I-STAT 7, (LYTES, BLD GAS, ICA, H+H)  Result Value Ref Range   pH, Arterial 7.285 (  L) 7.35 - 7.45   pCO2 arterial 47.9 32 - 48 mmHg   pO2, Arterial 163 (H) 83 - 108 mmHg   Bicarbonate 23.3 20.0 - 28.0 mmol/L   TCO2 25 22 - 32 mmol/L   O2 Saturation 99 %   Acid-base deficit 4.0 (H) 0.0 - 2.0 mmol/L   Sodium 136 135 - 145 mmol/L   Potassium 3.7 3.5 - 5.1 mmol/L   Calcium, Ion 1.24 1.15 - 1.40 mmol/L   HCT 31.0 (L) 36.0 - 46.0 %   Hemoglobin 10.5  (L) 12.0 - 15.0 g/dL   Patient temperature 35.1 C    Sample type ARTERIAL   I-STAT 7, (LYTES, BLD GAS, ICA, H+H)  Result Value Ref Range   pH, Arterial 7.335 (L) 7.35 - 7.45   pCO2 arterial 40.0 32 - 48 mmHg   pO2, Arterial 197 (H) 83 - 108 mmHg   Bicarbonate 21.6 20.0 - 28.0 mmol/L   TCO2 23 22 - 32 mmol/L   O2 Saturation 100 %   Acid-base deficit 4.0 (H) 0.0 - 2.0 mmol/L   Sodium 139 135 - 145 mmol/L   Potassium 3.7 3.5 - 5.1 mmol/L   Calcium, Ion 1.22 1.15 - 1.40 mmol/L   HCT 32.0 (L) 36.0 - 46.0 %   Hemoglobin 10.9 (L) 12.0 - 15.0 g/dL   Patient temperature 36.0 C    Sample type ARTERIAL   I-STAT 7, (LYTES, BLD GAS, ICA, H+H)  Result Value Ref Range   pH, Arterial 7.318 (L) 7.35 - 7.45   pCO2 arterial 40.2 32 - 48 mmHg   pO2, Arterial 239 (H) 83 - 108 mmHg   Bicarbonate 20.8 20.0 - 28.0 mmol/L   TCO2 22 22 - 32 mmol/L   O2 Saturation 100 %   Acid-base deficit 5.0 (H) 0.0 - 2.0 mmol/L   Sodium 140 135 - 145 mmol/L   Potassium 3.9 3.5 - 5.1 mmol/L   Calcium, Ion 1.20 1.15 - 1.40 mmol/L   HCT 32.0 (L) 36.0 - 46.0 %   Hemoglobin 10.9 (L) 12.0 - 15.0 g/dL   Patient temperature 36.2 C    Sample type ARTERIAL   I-STAT 7, (LYTES, BLD GAS, ICA, H+H)  Result Value Ref Range   pH, Arterial 7.282 (L) 7.35 - 7.45   pCO2 arterial 42.5 32 - 48 mmHg   pO2, Arterial 117 (H) 83 - 108 mmHg   Bicarbonate 20.0 20.0 - 28.0 mmol/L   TCO2 21 (L) 22 - 32 mmol/L   O2 Saturation 98 %   Acid-base deficit 6.0 (H) 0.0 - 2.0 mmol/L   Sodium 141 135 - 145 mmol/L   Potassium 4.4 3.5 - 5.1 mmol/L   Calcium, Ion 1.20 1.15 - 1.40 mmol/L   HCT 35.0 (L) 36.0 - 46.0 %   Hemoglobin 11.9 (L) 12.0 - 15.0 g/dL   Sample type ARTERIAL   ABO/Rh  Result Value Ref Range   ABO/RH(D)      B POS Performed at [facility] [address], [place], [place] [address]   Prepare RBC (crossmatch)  Result Value Ref Range   Order Confirmation      ORDER PROCESSED BY BLOOD BANK Performed at [facility], [place] [address] [place],  [address]     Discharge Medications:   Allergies as of 08/26/2021       Reactions   Lyrica [pregabalin] Shortness Of Breath, Swelling   lower extremity edema and weight gain   Belsomra [suvorexant]  Other (See Comments)   unknown   Morphine And Related Itching   Upper torso   Sulfamethoxazole-trimethoprim Itching, Rash   Bactrim   Tape Itching, Rash   Please use "paper" tape Rash if left on longer than 24 hrs        Medication List     STOP taking these medications    ferrous sulfate 325 (65 FE) MG tablet   gabapentin 800 MG tablet Commonly known as: NEURONTIN   Nucynta 100 MG Tabs Generic drug: Tapentadol HCl   oxyCODONE-acetaminophen 10-325 MG tablet Commonly known as: PERCOCET       TAKE these medications    amoxicillin 500 MG capsule Commonly known as: AMOXIL Take 2,000 mg by mouth See admin instructions. Take 2000 mg by mouth 1 hour prior to dental appointment   ascorbic acid 500 MG tablet Commonly known as: VITAMIN C Take 500 mg by mouth daily.   baclofen 10 MG tablet Commonly known as: LIORESAL Take 10 mg by mouth 3 (three) times daily.   CAL-MAG-ZINC-D PO Take 3 tablets by mouth daily.   diclofenac sodium 1 % Gel Commonly known as: VOLTAREN Apply 4 g topically 4 (four) times daily as needed (PAIN).   diphenhydrAMINE 2 % cream Commonly known as: BENADRYL Apply 1 Application topically daily as needed for itching (Rash).   diphenhydrAMINE 50 MG tablet Commonly known as: BENADRYL Take 1 tablet (50 mg total) by mouth every 8 (eight) hours as needed for itching.   docusate sodium 100 MG capsule Commonly known as: COLACE Take 1 capsule (100 mg total) by mouth 2 (two) times daily.   donepezil 10 MG tablet Commonly known as: ARICEPT Take 1 tablet (10 mg total) by mouth at bedtime.   DRY EYE RELIEF DROPS OP Place 1 drop into both eyes 2 (two) times daily.   DULoxetine 60 MG capsule Commonly known  as: CYMBALTA Take 60 mg by mouth 2 (two) times daily.   Emgality 120 MG/ML Sosy Generic drug: Galcanezumab-gnlm Inject 120 mg into the skin every 30 (thirty) days.   famotidine 40 MG tablet Commonly known as: PEPCID Take 1 tablet (40 mg total) by mouth at bedtime.   ferrous gluconate 324 MG tablet Commonly known as: FERGON Take 1 tablet (324 mg total) by mouth 2 (two) times daily with a meal.   folic acid 387 MCG tablet Commonly known as: FOLVITE Take 400 mcg by mouth daily.   hydrocortisone cream 1 % Apply 1 Application topically daily as needed for itching.   Krill Oil 350 MG Caps Take 350 mg by mouth at bedtime.   levothyroxine 125 MCG tablet Commonly known as: SYNTHROID Take 125 mcg by mouth daily before breakfast.   lubiprostone 24 MCG capsule Commonly known as: AMITIZA Take 24 mcg by mouth 2 (two) times daily with a meal.   Metafolbic Plus 6-2-600 MG Tabs Take 1 tablet by mouth daily. What changed: when to take this   methylphenidate 27 MG CR tablet Commonly known as: Concerta Take 1 tablet (27 mg total) by mouth 2 (two) times daily. What changed: additional instructions   mirabegron ER 50 MG Tb24 tablet Commonly known as: MYRBETRIQ Take 50 mg by mouth daily.   Narcan 4 MG/0.1ML Liqd nasal spray kit Generic drug: naloxone Place 0.4 mg into the nose once.   neomycin-bacitracin-polymyxin [phone] ointment Apply 1 Application topically daily as needed (sores).   Nurtec 75 MG Tbdp Generic drug: Rimegepant Sulfate Take 75 mg by mouth daily as needed. What  changed: reasons to take this   omeprazole 40 MG capsule Commonly known as: PRILOSEC Take one capsule by mouth daily before breaksfast.   ondansetron 4 MG disintegrating tablet Commonly known as: ZOFRAN-ODT Take 4 mg by mouth every 8 (eight) hours as needed for nausea or vomiting.   ONE-A-DAY WOMENS PETITES PO Take 1 tablet by mouth 2 (two) times daily.   oxyCODONE 20 mg 12 hr tablet Commonly  known as: OXYCONTIN Take 1 tablet (20 mg total) by mouth every 12 (twelve) hours.   Oxycodone HCl 10 MG Tabs Take 1 tablet (10 mg total) by mouth every 3 (three) hours as needed for severe pain ((score 7 to 10)).   polyethylene glycol 17 g packet Commonly known as: MIRALAX / GLYCOLAX Take 17 g by mouth daily as needed for mild constipation or moderate constipation.   PSEUDOEPHEDRINE HCL PO Take 1 tablet by mouth as needed (cold).   rivaroxaban 20 MG Tabs tablet Commonly known as: XARELTO Take 20 mg by mouth daily at 6 PM.   tiZANidine 4 MG tablet Commonly known as: Zanaflex Take 1 tablet (4 mg total) by mouth every 6 (six) hours as needed for muscle spasms.   topiramate 100 MG tablet Commonly known as: TOPAMAX Take 1 tablet (100 mg total) by mouth 2 (two) times daily.   traZODone 100 MG tablet Commonly known as: DESYREL Take 50 mg by mouth at bedtime.   trospium 20 MG tablet Commonly known as: SANCTURA Take 20 mg by mouth 2 (two) times daily.   Vitamin B-12 5000 MCG Subl Place 5,000 mcg under the tongue daily.   Vitamin D3 125 MCG (5000 UT) Tabs Take 5,000 Units by mouth daily.               Durable Medical Equipment  (From admission, onward)           Start     Ordered   08/21/21 2306  DME Walker rolling  Once       Question:  Patient needs a walker to treat with the following condition  Answer:  Status post lumbar spinal fusion   08/21/21 2306   08/21/21 2306  DME 3 n 1  Once        08/21/21 2306            Diagnostic Studies: DG THORACOLUMABAR SPINE  Result Date: 08/21/2021 CLINICAL DATA:  Fluoroscopic assistance for thoracolumbar fusion EXAM: THORACOLUMBAR SPINE 1V COMPARISON:  Lumbar spine radiograph done on 06/09/2021 FINDINGS: Fluoroscopic images show surgical fusion in lower thoracic and lumbar spine. Fluoroscopy time and radiation dose information is not provided. IMPRESSION: Fluoroscopic assistance was provided for surgery in  thoracolumbar spine. Electronically Signed   By: Elmer Picker M.D.   On: 08/21/2021 17:57   DG C-Arm 1-60 Min-No Report  Result Date: 08/21/2021 Fluoroscopy was utilized by the requesting physician.  No radiographic interpretation.   DG C-Arm 1-60 Min-No Report  Result Date: 08/21/2021 Fluoroscopy was utilized by the requesting physician.  No radiographic interpretation.   DG C-Arm 1-60 Min-No Report  Result Date: 08/21/2021 Fluoroscopy was utilized by the requesting physician.  No radiographic interpretation.   DG C-Arm 1-60 Min-No Report  Result Date: 08/21/2021 Fluoroscopy was utilized by the requesting physician.  No radiographic interpretation.   DG C-Arm 1-60 Min-No Report  Result Date: 08/21/2021 Fluoroscopy was utilized by the requesting physician.  No radiographic interpretation.   DG C-Arm 1-60 Min-No Report  Result Date: 08/21/2021 Fluoroscopy was utilized by the  requesting physician.  No radiographic interpretation.   DG C-Arm 1-60 Min-No Report  Result Date: 08/21/2021 Fluoroscopy was utilized by the requesting physician.  No radiographic interpretation.   DG C-Arm 1-60 Min-No Report  Result Date: 08/21/2021 Fluoroscopy was utilized by the requesting physician.  No radiographic interpretation.   DG C-Arm 1-60 Min-No Report  Result Date: 08/21/2021 Fluoroscopy was utilized by the requesting physician.  No radiographic interpretation.   DG Foot Complete Right  Result Date: 08/19/2021 CLINICAL DATA:  Pain right foot EXAM: RIGHT FOOT COMPLETE - 3+ VIEW COMPARISON:  11/30/2020 FINDINGS: No recent fracture or dislocation is seen. There is previous osteotomy in the head of the first metatarsal. There is a surgical pin in the second metatarsal. Deformities are seen in the PIP joints of second and third toes with no significant interval change. Degenerative changes are noted in intertarsal and tarsometatarsal joints. Small plantar spur is seen in calcaneus. Overall,  no significant interval changes are noted. IMPRESSION: No recent fracture or dislocation is seen in right foot. Other chronic findings as described in the body of the report. Electronically Signed   By: Elmer Picker M.D.   On: 08/19/2021 13:51    Disposition: Discharge disposition: 03-Skilled Nursing Facility       Discharge Instructions     Call MD / Call 911   Complete by: As directed    If you experience chest pain or shortness of breath, CALL 911 and be transported to the hospital emergency room.  If you develope a fever above 101 F, pus (white drainage) or increased drainage or redness at the wound, or calf pain, call your surgeon's office.   Constipation Prevention   Complete by: As directed    Drink plenty of fluids.  Prune juice may be helpful.  You may use a stool softener, such as Colace (over the counter) 100 mg twice a day.  Use MiraLax (over the counter) for constipation as needed.   Diet - low sodium heart healthy   Complete by: As directed    Discharge instructions   Complete by: As directed    Call if there is increasing drainage, fever greater than 101.5, severe head aches, and worsening nausea or light sensitivity. If shortness of breath, bloody cough or chest tightness or pain go to an emergency room. No lifting greater than 10 lbs. Avoid bending, stooping and twisting. Use brace when sitting and out of bed even to go to bathroom. Walk in house for first 2 weeks then may start to get out slowly increasing distances up to one mile by 4-6 weeks post op. After 5 days may shower and change dressing following bathing with shower.When bathing remove the brace shower and replace brace before getting out of the shower. If drainage, keep dry dressing and do not bathe the incision, use an moisture impervious dressing. Please call and return for scheduled follow up appointment 2 weeks from the time of surgery.   Driving restrictions   Complete by: As directed    No  driving for 6 weeks   Increase activity slowly as tolerated   Complete by: As directed    Lifting restrictions   Complete by: As directed    No lifting for 12 weeks   Post-operative opioid taper instructions:   Complete by: As directed    POST-OPERATIVE OPIOID TAPER INSTRUCTIONS: It is important to wean off of your opioid medication as soon as possible. If you do not need pain medication after your surgery it  is ok to stop day one. Opioids include: Codeine, Hydrocodone(Norco, Vicodin), Oxycodone(Percocet, oxycontin) and hydromorphone amongst others.  Long term and even short term use of opiods can cause: Increased pain response Dependence Constipation Depression Respiratory depression And more.  Withdrawal symptoms can include Flu like symptoms Nausea, vomiting And more Techniques to manage these symptoms Hydrate well Eat regular healthy meals Stay active Use relaxation techniques(deep breathing, meditating, yoga) Do Not substitute Alcohol to help with tapering If you have been on opioids for less than two weeks and do not have pain than it is ok to stop all together.  Plan to wean off of opioids This plan should start within one week post op of your joint replacement. Maintain the same interval or time between taking each dose and first decrease the dose.  Cut the total daily intake of opioids by one tablet each day Next start to increase the time between doses. The last dose that should be eliminated is the evening dose.           Follow-up Information     Jessy Oto, MD Follow up in 2 week(s).   Specialty: Orthopedic Surgery Why: For wound re-check, For suture removal Contact information: [place] [address] [phone]                  Signed: [name] 08/26/2021, 10:08 AM

## 2021-08-25 NOTE — TOC Progression Note (Addendum)
Transition of Care (TOC) - Progression Note  Marvetta Gibbons RN, BSN Transitions of Care Unit 4E- RN Case Manager See Treatment Team for direct phone #  4NP cross coverage  Patient Details  Name: Debbie Pineda MRN: [id number] Date of Birth: 07-Oct-1960  Transition of Care [facility]) CM/SW Contact  Dahlia Client, Romeo Rabon, RN Phone Number: 08/25/2021, 3:31 PM  Clinical Narrative:    CM in to speak with pt regarding transition plans- on arrival to the room PT present and getting ready to work with pt.  Pt expressed that [facility] was "expecting her" - explained to pt that we do not currently have insurance authorization for her to go to [facility] center and based on therapy notes there is concern that insurance may not approve.  Pt expressed that she has no one available to assist her and is concerned about going home without the needed assistance- her plan was to go to rehab at [facility]. Does not want to discuss [place] at this time.   PT to work with pt and update recommendations if needed- CSW has been updated and will f/u if SNF recommended to see if Insurance will approve.   Explained to pt at this time- rehab has not been approved and will need to submit to see if they will approve stay. If they do not then will need to look at return home w/ [facility].    Expected Discharge Plan: Skilled Nursing Facility Barriers to Discharge: Ship broker, Continued Medical Work up, SNF Pending bed offer  Expected Discharge Plan and Services Expected Discharge Plan: [place] In-house Referral: Clinical Social Work                                             Social Determinants of Health (SDOH) Interventions    Readmission Risk Interventions     No data to display

## 2021-08-25 NOTE — TOC Progression Note (Signed)
Transition of Care [facility]) - Progression Note    Patient Details  Name: Debbie Pineda MRN: [id number] Date of Birth: 1960-06-11  Transition of Care [facility]) CM/SW [place], [place] Phone Number: 08/25/2021, 4:20 PM  Clinical Narrative:     CSW informed of recommendation change to SNF. CSW called SNF to advise of recommendation change and to confirmed availability. SNF states they have availability and can admit over the weekend insurance authorization is approved.   CSW contacted [employer] & started authorization for SNF.   TOC will continue to follow and assist with discharge summary.  Thurmond Butts, MSW, LCSW Clinical Social Worker    Expected Discharge Plan: Skilled Nursing Facility Barriers to Discharge: Ship broker, Continued Medical Work up, SNF Pending bed offer  Expected Discharge Plan and Services Expected Discharge Plan: [place] In-house Referral: Clinical Social Work                                             Social Determinants of Health (SDOH) Interventions    Readmission Risk Interventions     No data to display

## 2021-08-25 NOTE — Progress Notes (Signed)
Physical Therapy Treatment Patient Details Name: Debbie Pineda MRN: [id number] DOB: Jul 16, 1960 Today's Date: 08/25/2021   History of Present Illness Patient is a 61 y/o female who presents on 08/21/21 s/p thoracolumbar fusion T10-S1 and TLIF L1-5 and bilateral L5-S1. Noted in MD note - pt on significant pain meds prior to admission and is likely opioid tolerant.  PMH includes CVA, factor V Leiden, chronic pain syndrome, cognitive impairment, DVT, ADD, PFO, OSA on CPAP.    PT Comments    The pt was agreeable to session and continues to demo slow but steady progress with mobility and endurance. She continues to need sequential cues for use of brace, spinal precautions, bed mobility, and positioning in the RW with mobility. The pt was then challenged by repeated sit-stand transfers, and standing marches with reduced UE support, and needed up to minA to steady with any reduction in UE support. During the session, pt reports that despite progress, her spouse will have to return to work when she is d/c and therefore she will be home alone without any supervision or assist during the day. Therefore, recommendations updated to SNF for short term rehab while pt regains independence due to lack of assist at home.    Recommendations for follow up therapy are one component of a multi-disciplinary discharge planning process, led by the attending physician.  Recommendations may be updated based on patient status, additional functional criteria and insurance authorization.  Follow Up Recommendations  Skilled nursing-short term rehab (<3 hours/day) Can patient physically be transported by private vehicle: Yes   Assistance Recommended at Discharge Intermittent Supervision/Assistance  Patient can return home with the following A little help with walking and/or transfers;A little help with bathing/dressing/bathroom;Assistance with cooking/housework;Direct supervision/assist for medications management;Direct  supervision/assist for financial management;Assist for transportation;Help with stairs or ramp for entrance   Equipment Recommendations  None recommended by PT (pt has needed DME)    Recommendations for Other Services       Precautions / Restrictions Precautions Precautions: Back;Fall Precaution Booklet Issued: Yes (comment) Precaution Comments: reviewed with pt, recall 2/3 (assist to recall bending) Required Braces or Orthoses: Spinal Brace Spinal Brace: Thoracolumbosacral orthotic;Applied in sitting position Restrictions Weight Bearing Restrictions: No     Mobility  Bed Mobility Overal bed mobility: Needs Assistance Bed Mobility: Rolling, Sidelying to Sit Rolling: Supervision Sidelying to sit: Min guard       General bed mobility comments: supervision to roll, minG with pt using bed rail to push up to sitting, increased time and effort    Transfers Overall transfer level: Needs assistance Equipment used: Rolling walker (2 wheels) Transfers: Sit to/from Stand Sit to Stand: Min guard           General transfer comment: minG with increased time to rise. pt needing cues for hand position. minA initially to steady in standing. minG with elevated bed to rise without UE support    Ambulation/Gait Ambulation/Gait assistance: Min guard, Min assist Gait Distance (Feet): 180 Feet Assistive device: Rolling walker (2 wheels) Gait Pattern/deviations: Step-through pattern, Decreased stride length, Trunk flexed Gait velocity: 0.42 m/s Gait velocity interpretation: <1.8 ft/sec, indicate of risk for recurrent falls   General Gait Details: slow but steady with single need for minA when using RW. reduced need for cues for posture and positioning in RW.      Balance Overall balance assessment: Needs assistance Sitting-balance support: No upper extremity supported, Feet supported Sitting balance-Leahy Scale: Good Sitting balance - Comments: min g for safety  Standing  balance support: Bilateral upper extremity supported, During functional activity Standing balance-Leahy Scale: Poor Standing balance comment: relies on UE support, minA for dynamic activities with single UE support                            Cognition Arousal/Alertness: Awake/alert Behavior During Therapy: WFL for tasks assessed/performed, Flat affect Overall Cognitive Status: Impaired/Different from baseline Area of Impairment: Memory, Safety/judgement, Problem solving                     Memory: Decreased recall of precautions, Decreased short-term memory   Safety/Judgement: Decreased awareness of safety, Decreased awareness of deficits   Problem Solving: Slow processing, Decreased initiation, Difficulty sequencing, Requires verbal cues General Comments: recall 2/3 precautions, cueing to recall how to don brace. good adherance of precautions functionally.        Exercises General Exercises - Lower Extremity Hip Flexion/Marching: AROM, Both, 10 reps, Standing (x10 with BUE support on RW and x10 with single UE support through [facility]) Other Exercises Other Exercises: 5x sit-stand from EOB without use of UE    General Comments General comments (skin integrity, edema, etc.): VSS spouse present and supportive      Pertinent Vitals/Pain Pain Assessment Pain Assessment: Faces Faces Pain Scale: Hurts little more Pain Location: back Pain Descriptors / Indicators: Discomfort, Sore, Operative site guarding, Sharp Pain Intervention(s): Premedicated before session, Limited activity within patient's tolerance, Monitored during session, Repositioned     PT Goals (current goals can now be found in the care plan section) Acute Rehab PT Goals Patient Stated Goal: per spouse, to go to rehab PT Goal Formulation: With family Time For Goal Achievement: 09/05/21 Potential to Achieve Goals: Fair Progress towards PT goals: Progressing toward goals    Frequency    Min  5X/week      PT Plan Current plan remains appropriate       AM-PAC PT "6 Clicks" Mobility   Outcome Measure  Help needed turning from your back to your side while in a flat bed without using bedrails?: A Little Help needed moving from lying on your back to sitting on the side of a flat bed without using bedrails?: A Little Help needed moving to and from a bed to a chair (including a wheelchair)?: A Little Help needed standing up from a chair using your arms (e.g., wheelchair or bedside chair)?: A Little Help needed to walk in hospital room?: A Little Help needed climbing 3-5 steps with a railing? : A Little 6 Click Score: 18    End of Session Equipment Utilized During Treatment: Gait belt;Back brace Activity Tolerance: Patient tolerated treatment well Patient left: with call bell/phone within reach;with family/visitor present;in chair (on toilet in bathroom) Nurse Communication: Mobility status PT Visit Diagnosis: Pain;Muscle weakness (generalized) (M62.81);Difficulty in walking, not elsewhere classified (R26.2)     Time: 3662-9476 PT Time Calculation (min) (ACUTE ONLY): 21 min  Charges:  $Gait Training: 8-22 mins                     West Carbo, PT, DPT   Acute Rehabilitation Department   [name] 08/25/2021, 4:01 PM

## 2021-08-25 NOTE — Progress Notes (Signed)
     Subjective: 4 Days Post-Op Procedure(s) (LRB): Removal of the spinal cord stimulator and thoracolumbar fusion T10 to S1 with Transforaminal Lumbar Interbody Fusions left L1-2, L2-3, L3-4 and L4-5, bilateral L5-S1 foramenotomies and left T10-11 foramenotomy this would be done with rods, screws and cages with local bone graft and allograft, Vivigen (N/A) Awake, alert and oriented x 4. I feel much better with blood transfusions.x 2. She has more energry and is able to participate with PT OT. Will restart xarelto today and recheck CBC and BMET in AM Sat. Anticipate may be ready for SNF tomorrow if CBC and BMET remain stable . No BM and has tried dulcolax with out much help. Relates that in the past required digital disimpaction and Fleets.    Patient reports pain as moderate.    Objective:   VITALS:  Temp:  [97.6 F (36.4 C)-98.9 F (37.2 C)] 98.8 F (37.1 C) (08/04 1156) Pulse Rate:  [62-87] 87 (08/04 1156) Resp:  [16-18] 18 (08/04 1156) BP: (108-136)/(52-60) 129/53 (08/04 1156) SpO2:  [92 %-97 %] 95 % (08/04 1156)  Neurologically intact ABD soft Neurovascular intact Sensation intact distally Intact pulses distally Dorsiflexion/Plantar flexion intact Incision: dressing C/D/I, no drainage, and Dressing intact and dry.   LABS Recent Labs    08/23/21 0558 08/24/21 0208 08/24/21 1836  HGB 8.3* 7.3* 10.1*  WBC 8.0 7.9  --   PLT 153 156  --    Recent Labs    08/23/21 0558 08/24/21 0208  NA 139 139  K 3.2* 3.9  CL 113* 111  CO2 22 22  BUN 7 8  CREATININE 0.80 0.75  GLUCOSE 130* 102*   No results for input(s): "LABPT", "INR" in the last 72 hours.   Assessment/Plan: 4 Days Post-Op Procedure(s) (LRB): Removal of the spinal cord stimulator and thoracolumbar fusion T10 to S1 with Transforaminal Lumbar Interbody Fusions left L1-2, L2-3, L3-4 and L4-5, bilateral L5-S1 foramenotomies and left T10-11 foramenotomy this would be done with rods, screws and cages with local  bone graft and allograft, Vivigen (N/A)  Advance diet Up with therapy D/C IV fluids Discharge to SNF possibly tomorrow if h/h stable with restarting xarelto and K is stable.   Debbie Pineda 08/25/2021, 1:29 PM Patient ID: Debbie Pineda, female   DOB: [birth year], 61 y.o.   MRN: [id number]

## 2021-08-26 LAB — CBC WITH DIFFERENTIAL/PLATELET
Abs Immature Granulocytes: 0.26 10*3/uL — ABNORMAL HIGH (ref 0.00–0.07)
Basophils Absolute: 0.1 10*3/uL (ref 0.0–0.1)
Basophils Relative: 1 %
Eosinophils Absolute: 0.3 10*3/uL (ref 0.0–0.5)
Eosinophils Relative: 4 %
HCT: 31.2 % — ABNORMAL LOW (ref 36.0–46.0)
Hemoglobin: 10.4 g/dL — ABNORMAL LOW (ref 12.0–15.0)
Immature Granulocytes: 4 %
Lymphocytes Relative: 34 %
Lymphs Abs: 2.5 10*3/uL (ref 0.7–4.0)
MCH: 31.1 pg (ref 26.0–34.0)
MCHC: 33.3 g/dL (ref 30.0–36.0)
MCV: 93.4 fL (ref 80.0–100.0)
Monocytes Absolute: 0.5 10*3/uL (ref 0.1–1.0)
Monocytes Relative: 7 %
Neutro Abs: 3.9 10*3/uL (ref 1.7–7.7)
Neutrophils Relative %: 50 %
Platelets: 259 10*3/uL (ref 150–400)
RBC: 3.34 MIL/uL — ABNORMAL LOW (ref 3.87–5.11)
RDW: 14.1 % (ref 11.5–15.5)
WBC: 7.5 10*3/uL (ref 4.0–10.5)
nRBC: 0 % (ref 0.0–0.2)

## 2021-08-26 LAB — BASIC METABOLIC PANEL
Anion gap: 8 (ref 5–15)
BUN: 6 mg/dL (ref 6–20)
CO2: 24 mmol/L (ref 22–32)
Calcium: 8.3 mg/dL — ABNORMAL LOW (ref 8.9–10.3)
Chloride: 109 mmol/L (ref 98–111)
Creatinine, Ser: 0.92 mg/dL (ref 0.44–1.00)
GFR, Estimated: >60 mL/min (ref >60)
Glucose, Bld: 146 mg/dL — ABNORMAL HIGH (ref 70–99)
Potassium: 3.9 mmol/L (ref 3.5–5.1)
Sodium: 141 mmol/L (ref 135–145)

## 2021-08-26 MED ORDER — DIPHENHYDRAMINE HCL 25 MG PO CAPS
25.0000 mg | ORAL_CAPSULE | ORAL | Status: DC | PRN
  Administered 2021-08-26: 25 mg via ORAL
  Filled 2021-08-26: qty 1

## 2021-08-26 MED ORDER — DOCUSATE SODIUM 100 MG PO CAPS: 100.0000 mg | ORAL_CAPSULE | Freq: Two times a day (BID) | ORAL | 0 refills | Status: DC

## 2021-08-26 MED ORDER — FERROUS GLUCONATE 324 (38 FE) MG PO TABS: 324.0000 mg | ORAL_TABLET | Freq: Two times a day (BID) | ORAL | 1 refills | Status: DC

## 2021-08-26 MED ORDER — OXYCODONE HCL 10 MG PO TABS: 10.0000 mg | ORAL_TABLET | ORAL | 0 refills | Status: DC | PRN

## 2021-08-26 MED ORDER — OXYCODONE HCL ER 20 MG PO T12A: 20.0000 mg | EXTENDED_RELEASE_TABLET | Freq: Two times a day (BID) | ORAL | 0 refills | Status: DC

## 2021-08-26 MED ORDER — DIPHENHYDRAMINE HCL 50 MG PO TABS: 50.0000 mg | ORAL_TABLET | Freq: Three times a day (TID) | ORAL | 0 refills | Status: DC | PRN

## 2021-08-26 NOTE — Progress Notes (Signed)
Mobility Specialist Progress Note   08/26/21 1632  Mobility  Activity Ambulated with assistance in hallway  Level of Assistance Standby assist, set-up cues, supervision of patient - no hands on  Assistive Device Front wheel walker  Distance Ambulated (ft) 428 ft  Activity Response Tolerated well  $Mobility charge 1 Mobility   Patient received in chair agreeable to participate in mobility. Ambulated in hallway independently w/ a slightly flexed trunk but pt able to self correct, gait was also steady. Returned to room without incident but did c/o pain increasing. Was left in chair with all needs met, call bell in reach.   Holland Falling Mobility Specialist MS [facility] #:  [phone] Acute Rehab Office:  [phone]

## 2021-08-26 NOTE — Progress Notes (Signed)
     Subjective: 5 Days Post-Op Procedure(s) (LRB): Removal of the spinal cord stimulator and thoracolumbar fusion T10 to S1 with Transforaminal Lumbar Interbody Fusions left L1-2, L2-3, L3-4 and L4-5, bilateral L5-S1 foramenotomies and left T10-11 foramenotomy this would be done with rods, screws and cages with local bone graft and allograft, Vivigen (N/A) Awake, alert and oriented x 4. Dressing dry and changed. BM. Walking with assistance. PT and OT in agreement that SNF is appropriate post op  Post hospital setting to improve her overall mobility and indepence to where she may proceed eventually home.   Patient reports pain as moderate.    Objective:   VITALS:  Temp:  [97.8 F (36.6 C)-98.9 F (37.2 C)] 97.8 F (36.6 C) (08/05 0800) Pulse Rate:  [67-87] 75 (08/05 0800) Resp:  [18-20] 18 (08/05 0800) BP: (113-149)/(45-70) 113/47 (08/05 0900) SpO2:  [94 %-97 %] 94 % (08/05 0800)  Neurologically intact ABD soft Neurovascular intact Sensation intact distally Intact pulses distally Dorsiflexion/Plantar flexion intact Incision: dressing C/D/I and no drainage   LABS Recent Labs    08/24/21 0208 08/24/21 1836 08/25/21 1423  HGB 7.3* 10.1* 10.3*  WBC 7.9  --  8.0  PLT 156  --  204   Recent Labs    08/24/21 0208 08/25/21 1423  NA 139 140  K 3.9 4.2  CL 111 110  CO2 22 26  BUN 8 10  CREATININE 0.75 0.86  GLUCOSE 102* 95   No results for input(s): "LABPT", "INR" in the last 72 hours.   Assessment/Plan: 5 Days Post-Op Procedure(s) (LRB): Removal of the spinal cord stimulator and thoracolumbar fusion T10 to S1 with Transforaminal Lumbar Interbody Fusions left L1-2, L2-3, L3-4 and L4-5, bilateral L5-S1 foramenotomies and left T10-11 foramenotomy this would be done with rods, screws and cages with local bone graft and allograft, Vivigen (N/A)  Advance diet Up with therapy Discharge to SNF  Debbie Pineda 08/26/2021, 9:27 AM Patient ID: Debbie Pineda, female   DOB:  1960/05/20, 61 y.o.   MRN: [id number]

## 2021-08-27 DIAGNOSIS — K5903 Drug induced constipation: Secondary | ICD-10-CM | POA: Diagnosis not present

## 2021-08-27 DIAGNOSIS — I872 Venous insufficiency (chronic) (peripheral): Secondary | ICD-10-CM | POA: Diagnosis not present

## 2021-08-27 DIAGNOSIS — M6281 Muscle weakness (generalized): Secondary | ICD-10-CM | POA: Diagnosis not present

## 2021-08-27 DIAGNOSIS — Q2112 Patent foramen ovale: Secondary | ICD-10-CM | POA: Diagnosis not present

## 2021-08-27 DIAGNOSIS — D509 Iron deficiency anemia, unspecified: Secondary | ICD-10-CM | POA: Diagnosis not present

## 2021-08-27 DIAGNOSIS — M5136 Other intervertebral disc degeneration, lumbar region: Secondary | ICD-10-CM | POA: Diagnosis not present

## 2021-08-27 DIAGNOSIS — M549 Dorsalgia, unspecified: Secondary | ICD-10-CM | POA: Diagnosis not present

## 2021-08-27 DIAGNOSIS — N3281 Overactive bladder: Secondary | ICD-10-CM | POA: Diagnosis not present

## 2021-08-27 DIAGNOSIS — G4700 Insomnia, unspecified: Secondary | ICD-10-CM | POA: Diagnosis not present

## 2021-08-27 DIAGNOSIS — G4733 Obstructive sleep apnea (adult) (pediatric): Secondary | ICD-10-CM | POA: Diagnosis not present

## 2021-08-27 DIAGNOSIS — Z4789 Encounter for other orthopedic aftercare: Secondary | ICD-10-CM | POA: Diagnosis not present

## 2021-08-27 DIAGNOSIS — Z743 Need for continuous supervision: Secondary | ICD-10-CM | POA: Diagnosis not present

## 2021-08-27 DIAGNOSIS — Z9189 Other specified personal risk factors, not elsewhere classified: Secondary | ICD-10-CM | POA: Diagnosis not present

## 2021-08-27 DIAGNOSIS — Z741 Need for assistance with personal care: Secondary | ICD-10-CM | POA: Diagnosis not present

## 2021-08-27 DIAGNOSIS — Z9989 Dependence on other enabling machines and devices: Secondary | ICD-10-CM | POA: Diagnosis not present

## 2021-08-27 DIAGNOSIS — E876 Hypokalemia: Secondary | ICD-10-CM | POA: Diagnosis not present

## 2021-08-27 DIAGNOSIS — M8589 Other specified disorders of bone density and structure, multiple sites: Secondary | ICD-10-CM | POA: Diagnosis not present

## 2021-08-27 DIAGNOSIS — E7212 Methylenetetrahydrofolate reductase deficiency: Secondary | ICD-10-CM | POA: Diagnosis not present

## 2021-08-27 DIAGNOSIS — R262 Difficulty in walking, not elsewhere classified: Secondary | ICD-10-CM | POA: Diagnosis not present

## 2021-08-27 DIAGNOSIS — G43709 Chronic migraine without aura, not intractable, without status migrainosus: Secondary | ICD-10-CM | POA: Diagnosis not present

## 2021-08-27 DIAGNOSIS — I679 Cerebrovascular disease, unspecified: Secondary | ICD-10-CM | POA: Diagnosis not present

## 2021-08-27 DIAGNOSIS — M419 Scoliosis, unspecified: Secondary | ICD-10-CM | POA: Diagnosis not present

## 2021-08-27 DIAGNOSIS — R531 Weakness: Secondary | ICD-10-CM | POA: Diagnosis not present

## 2021-08-27 DIAGNOSIS — D62 Acute posthemorrhagic anemia: Secondary | ICD-10-CM | POA: Diagnosis not present

## 2021-08-27 DIAGNOSIS — M4326 Fusion of spine, lumbar region: Secondary | ICD-10-CM | POA: Diagnosis not present

## 2021-08-27 DIAGNOSIS — F909 Attention-deficit hyperactivity disorder, unspecified type: Secondary | ICD-10-CM | POA: Diagnosis not present

## 2021-08-27 DIAGNOSIS — M159 Polyosteoarthritis, unspecified: Secondary | ICD-10-CM | POA: Diagnosis not present

## 2021-08-27 DIAGNOSIS — M797 Fibromyalgia: Secondary | ICD-10-CM | POA: Diagnosis not present

## 2021-08-27 DIAGNOSIS — M5459 Other low back pain: Secondary | ICD-10-CM | POA: Diagnosis not present

## 2021-08-27 DIAGNOSIS — G894 Chronic pain syndrome: Secondary | ICD-10-CM | POA: Diagnosis not present

## 2021-08-27 DIAGNOSIS — Z981 Arthrodesis status: Secondary | ICD-10-CM | POA: Diagnosis not present

## 2021-08-27 DIAGNOSIS — Z7901 Long term (current) use of anticoagulants: Secondary | ICD-10-CM | POA: Diagnosis not present

## 2021-08-27 DIAGNOSIS — K219 Gastro-esophageal reflux disease without esophagitis: Secondary | ICD-10-CM | POA: Diagnosis not present

## 2021-08-27 DIAGNOSIS — E039 Hypothyroidism, unspecified: Secondary | ICD-10-CM | POA: Diagnosis not present

## 2021-08-27 DIAGNOSIS — R278 Other lack of coordination: Secondary | ICD-10-CM | POA: Diagnosis not present

## 2021-08-27 NOTE — Progress Notes (Signed)
Pt with d/c orders to [facility]. Report called to Janett Billow all questions answered. Discharge packet printed and placed in chart for PTAR. Pt made aware and all questions answered.

## 2021-08-27 NOTE — Discharge Summary (Signed)
Patient ID: Debbie Pineda MRN: [id number] DOB/AGE: [birth year] 61 y.o.  Admit date: 08/21/2021 Discharge date: 08/27/2021  Admission Diagnoses:  Status post lumbar spinal fusion  Discharge Diagnoses:  Principal Problem:   Status post lumbar spinal fusion   Past Medical History:  Diagnosis Date   ADD (attention deficit disorder)    Arthritis    "all over"   Cerebral infarction ([place]) 10/30/2011   Cerebrovascular disease 08/14/2016   Chronic back pain    "all over"   Chronic low back pain 18/93/7374   Complication of anesthesia    tends to have hypotension when NPO and post-anesthesia   Constipation    takes stool softener daily   Degenerative disk disease    Degenerative joint disease    DVT (deep venous thrombosis) ([place]) 2014   RLE   Family history of adverse reaction to anesthesia    a family member woke up during surgery; "think it was my mom"   Fibromyalgia    Generalized osteoarthritis of multiple sites 11/04/2013   GERD (gastroesophageal reflux disease)    Headache    Heart palpitations 12/14/2013   resolved   History of blood clots    superficial   Hypoglycemia    Hypothyroid    takes Synthroid daily   Incomplete emptying of bladder    Insomnia    takes Trazodone nightly   Iron deficiency anemia    takes Ferrous Sulfate daily   Joint pain    Joint swelling    knees and ankles   Memory disorder 08/14/2016   Morbid obesity ([place])    Nausea    takes Zofran as needed.Seeing GI doc   Neck pain 05/25/2016   OSA on CPAP    tested more than 5 yrs ago.     Osteoarthritis    Osteopenia    in feet   PFO (patent foramen ovale)    no murmur   Pre-diabetes    Primary osteoarthritis of both feet 05/29/2016   Right bunionectomy August 2017 by Dr. Sharol Given   Scoliosis    Skin abnormality 02/04/2020   raised area on lip   Sleep apnea    mild osa per pt   Stroke [facility]) "several"last 2014   right foot weakness; memory issues, black spot right visual field  since" (03/23/2015)   Thrombophlebitis    Trochanteric bursitis of both hips 05/25/2016   Unilateral primary osteoarthritis, right knee 05/29/2016   Urinary urgency 04/19/2016   Vein disorder 11/29/2010   varicose veins both legs   Wears glasses     Surgeries: Procedure(s): Removal of the spinal cord stimulator and thoracolumbar fusion T10 to S1 with Transforaminal Lumbar Interbody Fusions left L1-2, L2-3, L3-4 and L4-5, bilateral L5-S1 foramenotomies and left T10-11 foramenotomy this would be done with rods, screws and cages with local bone graft and allograft, Vivigen on 08/21/2021   Consultants (if any):   Discharged Condition: Improved  Hospital Course: Debbie Pineda is an 61 y.o. female who was admitted 08/21/2021 with a diagnosis of Status post lumbar spinal fusion and went to the operating room on 08/21/2021 and underwent the above named procedures.    She was given perioperative antibiotics:  Anti-infectives (From admission, onward)    Start     Dose/Rate Route Frequency Ordered Stop   08/22/21 0000  ceFAZolin (ANCEF) IVPB 2g/100 mL premix        2 g 200 mL/hr over 30 Minutes Intravenous Every 8 hours 08/21/21 2306 08/22/21 0847  08/21/21 1700  vancomycin (VANCOCIN) powder  Status:  Discontinued          As needed 08/21/21 1700 08/21/21 1847   08/21/21 0630  ceFAZolin (ANCEF) IVPB 2g/100 mL premix        2 g 200 mL/hr over 30 Minutes Intravenous On call to O.R. 08/21/21 2440 08/21/21 1638     .  She was given sequential compression devices, early ambulation, and appropriate chemoprophylaxis for DVT prophylaxis.  She benefited maximally from the hospital stay and there were no complications.    Recent vital signs:  Vitals:   08/26/21 2318 08/27/21 0810  BP: (!) 113/42 (!) 128/56  Pulse: 72 66  Resp: 20 18  Temp: 98.9 F (37.2 C) 98.8 F (37.1 C)  SpO2:  92%    Recent laboratory studies:  Lab Results  Component Value Date   HGB 10.4 (L) 08/26/2021   HGB  10.3 (L) 08/25/2021   HGB 10.1 (L) 08/24/2021   Lab Results  Component Value Date   WBC 7.5 08/26/2021   PLT 259 08/26/2021   Lab Results  Component Value Date   INR 1.1 10/27/2018   Lab Results  Component Value Date   NA 141 08/26/2021   K 3.9 08/26/2021   CL 109 08/26/2021   CO2 24 08/26/2021   BUN 6 08/26/2021   CREATININE 0.92 08/26/2021   GLUCOSE 146 (H) 08/26/2021    Discharge Medications:   Allergies as of 08/27/2021       Reactions   Lyrica [pregabalin] Shortness Of Breath, Swelling   lower extremity edema and weight gain   Belsomra [suvorexant] Other (See Comments)   unknown   Morphine And Related Itching   Upper torso   Sulfamethoxazole-trimethoprim Itching, Rash   Bactrim   Tape Itching, Rash   Please use "paper" tape Rash if left on longer than 24 hrs        Medication List     STOP taking these medications    ferrous sulfate 325 (65 FE) MG tablet   gabapentin 800 MG tablet Commonly known as: NEURONTIN   Nucynta 100 MG Tabs Generic drug: Tapentadol HCl   oxyCODONE-acetaminophen 10-325 MG tablet Commonly known as: PERCOCET       TAKE these medications    amoxicillin 500 MG capsule Commonly known as: AMOXIL Take 2,000 mg by mouth See admin instructions. Take 2000 mg by mouth 1 hour prior to dental appointment   ascorbic acid 500 MG tablet Commonly known as: VITAMIN C Take 500 mg by mouth daily.   baclofen 10 MG tablet Commonly known as: LIORESAL Take 10 mg by mouth 3 (three) times daily.   CAL-MAG-ZINC-D PO Take 3 tablets by mouth daily.   diclofenac sodium 1 % Gel Commonly known as: VOLTAREN Apply 4 g topically 4 (four) times daily as needed (PAIN).   diphenhydrAMINE 2 % cream Commonly known as: BENADRYL Apply 1 Application topically daily as needed for itching (Rash).   diphenhydrAMINE 50 MG tablet Commonly known as: BENADRYL Take 1 tablet (50 mg total) by mouth every 8 (eight) hours as needed for itching.   docusate  sodium 100 MG capsule Commonly known as: COLACE Take 1 capsule (100 mg total) by mouth 2 (two) times daily.   donepezil 10 MG tablet Commonly known as: ARICEPT Take 1 tablet (10 mg total) by mouth at bedtime.   DRY EYE RELIEF DROPS OP Place 1 drop into both eyes 2 (two) times daily.   DULoxetine 60 MG capsule  Commonly known as: CYMBALTA Take 60 mg by mouth 2 (two) times daily.   Emgality 120 MG/ML Sosy Generic drug: Galcanezumab-gnlm Inject 120 mg into the skin every 30 (thirty) days.   famotidine 40 MG tablet Commonly known as: PEPCID Take 1 tablet (40 mg total) by mouth at bedtime.   ferrous gluconate 324 MG tablet Commonly known as: FERGON Take 1 tablet (324 mg total) by mouth 2 (two) times daily with a meal.   folic acid 227 MCG tablet Commonly known as: FOLVITE Take 400 mcg by mouth daily.   hydrocortisone cream 1 % Apply 1 Application topically daily as needed for itching.   Krill Oil 350 MG Caps Take 350 mg by mouth at bedtime.   levothyroxine 125 MCG tablet Commonly known as: SYNTHROID Take 125 mcg by mouth daily before breakfast.   lubiprostone 24 MCG capsule Commonly known as: AMITIZA Take 24 mcg by mouth 2 (two) times daily with a meal.   Metafolbic Plus 6-2-600 MG Tabs Take 1 tablet by mouth daily. What changed: when to take this   methylphenidate 27 MG CR tablet Commonly known as: Concerta Take 1 tablet (27 mg total) by mouth 2 (two) times daily. What changed: additional instructions   mirabegron ER 50 MG Tb24 tablet Commonly known as: MYRBETRIQ Take 50 mg by mouth daily.   Narcan 4 MG/0.1ML Liqd nasal spray kit Generic drug: naloxone Place 0.4 mg into the nose once.   neomycin-bacitracin-polymyxin [phone] ointment Apply 1 Application topically daily as needed (sores).   Nurtec 75 MG Tbdp Generic drug: Rimegepant Sulfate Take 75 mg by mouth daily as needed. What changed: reasons to take this   omeprazole 40 MG capsule Commonly  known as: PRILOSEC Take one capsule by mouth daily before breaksfast.   ondansetron 4 MG disintegrating tablet Commonly known as: ZOFRAN-ODT Take 4 mg by mouth every 8 (eight) hours as needed for nausea or vomiting.   ONE-A-DAY WOMENS PETITES PO Take 1 tablet by mouth 2 (two) times daily.   oxyCODONE 20 mg 12 hr tablet Commonly known as: OXYCONTIN Take 1 tablet (20 mg total) by mouth every 12 (twelve) hours.   Oxycodone HCl 10 MG Tabs Take 1 tablet (10 mg total) by mouth every 3 (three) hours as needed for severe pain ((score 7 to 10)).   polyethylene glycol 17 g packet Commonly known as: MIRALAX / GLYCOLAX Take 17 g by mouth daily as needed for mild constipation or moderate constipation.   PSEUDOEPHEDRINE HCL PO Take 1 tablet by mouth as needed (cold).   rivaroxaban 20 MG Tabs tablet Commonly known as: XARELTO Take 20 mg by mouth daily at 6 PM.   tiZANidine 4 MG tablet Commonly known as: Zanaflex Take 1 tablet (4 mg total) by mouth every 6 (six) hours as needed for muscle spasms.   topiramate 100 MG tablet Commonly known as: TOPAMAX Take 1 tablet (100 mg total) by mouth 2 (two) times daily.   traZODone 100 MG tablet Commonly known as: DESYREL Take 50 mg by mouth at bedtime.   trospium 20 MG tablet Commonly known as: SANCTURA Take 20 mg by mouth 2 (two) times daily.   Vitamin B-12 5000 MCG Subl Place 5,000 mcg under the tongue daily.   Vitamin D3 125 MCG (5000 UT) Tabs Take 5,000 Units by mouth daily.               Durable Medical Equipment  (From admission, onward)  Start     Ordered   08/21/21 2306  DME Walker rolling  Once       Question:  Patient needs a walker to treat with the following condition  Answer:  Status post lumbar spinal fusion   08/21/21 2306   08/21/21 2306  DME 3 n 1  Once        08/21/21 2306            Diagnostic Studies: DG THORACOLUMABAR SPINE  Result Date: 08/21/2021 CLINICAL DATA:  Fluoroscopic  assistance for thoracolumbar fusion EXAM: THORACOLUMBAR SPINE 1V COMPARISON:  Lumbar spine radiograph done on 06/09/2021 FINDINGS: Fluoroscopic images show surgical fusion in lower thoracic and lumbar spine. Fluoroscopy time and radiation dose information is not provided. IMPRESSION: Fluoroscopic assistance was provided for surgery in thoracolumbar spine. Electronically Signed   By: Elmer Picker M.D.   On: 08/21/2021 17:57   DG C-Arm 1-60 Min-No Report  Result Date: 08/21/2021 Fluoroscopy was utilized by the requesting physician.  No radiographic interpretation.   DG C-Arm 1-60 Min-No Report  Result Date: 08/21/2021 Fluoroscopy was utilized by the requesting physician.  No radiographic interpretation.   DG C-Arm 1-60 Min-No Report  Result Date: 08/21/2021 Fluoroscopy was utilized by the requesting physician.  No radiographic interpretation.   DG C-Arm 1-60 Min-No Report  Result Date: 08/21/2021 Fluoroscopy was utilized by the requesting physician.  No radiographic interpretation.   DG C-Arm 1-60 Min-No Report  Result Date: 08/21/2021 Fluoroscopy was utilized by the requesting physician.  No radiographic interpretation.   DG C-Arm 1-60 Min-No Report  Result Date: 08/21/2021 Fluoroscopy was utilized by the requesting physician.  No radiographic interpretation.   DG C-Arm 1-60 Min-No Report  Result Date: 08/21/2021 Fluoroscopy was utilized by the requesting physician.  No radiographic interpretation.   DG C-Arm 1-60 Min-No Report  Result Date: 08/21/2021 Fluoroscopy was utilized by the requesting physician.  No radiographic interpretation.   DG C-Arm 1-60 Min-No Report  Result Date: 08/21/2021 Fluoroscopy was utilized by the requesting physician.  No radiographic interpretation.   DG Foot Complete Right  Result Date: 08/19/2021 CLINICAL DATA:  Pain right foot EXAM: RIGHT FOOT COMPLETE - 3+ VIEW COMPARISON:  11/30/2020 FINDINGS: No recent fracture or dislocation is seen.  There is previous osteotomy in the head of the first metatarsal. There is a surgical pin in the second metatarsal. Deformities are seen in the PIP joints of second and third toes with no significant interval change. Degenerative changes are noted in intertarsal and tarsometatarsal joints. Small plantar spur is seen in calcaneus. Overall, no significant interval changes are noted. IMPRESSION: No recent fracture or dislocation is seen in right foot. Other chronic findings as described in the body of the report. Electronically Signed   By: Elmer Picker M.D.   On: 08/19/2021 13:51    Disposition: Discharge disposition: 03-Skilled Nursing Facility       Discharge Instructions     Call MD / Call 911   Complete by: As directed    If you experience chest pain or shortness of breath, CALL 911 and be transported to the hospital emergency room.  If you develope a fever above 101 F, pus (white drainage) or increased drainage or redness at the wound, or calf pain, call your surgeon's office.   Constipation Prevention   Complete by: As directed    Drink plenty of fluids.  Prune juice may be helpful.  You may use a stool softener, such as Colace (over the counter) 100 mg  twice a day.  Use MiraLax (over the counter) for constipation as needed.   Diet - low sodium heart healthy   Complete by: As directed    Discharge instructions   Complete by: As directed    Call if there is increasing drainage, fever greater than 101.5, severe head aches, and worsening nausea or light sensitivity. If shortness of breath, bloody cough or chest tightness or pain go to an emergency room. No lifting greater than 10 lbs. Avoid bending, stooping and twisting. Use brace when sitting and out of bed even to go to bathroom. Walk in house for first 2 weeks then may start to get out slowly increasing distances up to one mile by 4-6 weeks post op. After 5 days may shower and change dressing following bathing with  shower.When bathing remove the brace shower and replace brace before getting out of the shower. If drainage, keep dry dressing and do not bathe the incision, use an moisture impervious dressing. Please call and return for scheduled follow up appointment 2 weeks from the time of surgery.   Driving restrictions   Complete by: As directed    No driving for 6 weeks   Increase activity slowly as tolerated   Complete by: As directed    Lifting restrictions   Complete by: As directed    No lifting for 12 weeks   Post-operative opioid taper instructions:   Complete by: As directed    POST-OPERATIVE OPIOID TAPER INSTRUCTIONS: It is important to wean off of your opioid medication as soon as possible. If you do not need pain medication after your surgery it is ok to stop day one. Opioids include: Codeine, Hydrocodone(Norco, Vicodin), Oxycodone(Percocet, oxycontin) and hydromorphone amongst others.  Long term and even short term use of opiods can cause: Increased pain response Dependence Constipation Depression Respiratory depression And more.  Withdrawal symptoms can include Flu like symptoms Nausea, vomiting And more Techniques to manage these symptoms Hydrate well Eat regular healthy meals Stay active Use relaxation techniques(deep breathing, meditating, yoga) Do Not substitute Alcohol to help with tapering If you have been on opioids for less than two weeks and do not have pain than it is ok to stop all together.  Plan to wean off of opioids This plan should start within one week post op of your joint replacement. Maintain the same interval or time between taking each dose and first decrease the dose.  Cut the total daily intake of opioids by one tablet each day Next start to increase the time between doses. The last dose that should be eliminated is the evening dose.           Follow-up Information     Jessy Oto, MD Follow up in 2 week(s).   Specialty: Orthopedic  Surgery Why: For wound re-check, For suture removal Contact information: [place] [address] [phone]                  Signed: Vanetta Mulders 08/27/2021, 1:00 PM

## 2021-08-27 NOTE — Progress Notes (Signed)
     Subjective: 6 Days Post-Op Procedure(s) (LRB): Removal of the spinal cord stimulator and thoracolumbar fusion T10 to S1 with Transforaminal Lumbar Interbody Fusions left L1-2, L2-3, L3-4 and L4-5, bilateral L5-S1 foramenotomies and left T10-11 foramenotomy this would be done with rods, screws and cages with local bone graft and allograft, Vivigen (N/A) Awake, alert and oriented x 4.  Tolerating some p.o.  Has mobilized with physical therapy.  Pending SNF placement Patient reports pain as moderate.    Objective:   VITALS:  Temp:  [97.8 F (36.6 C)-98.9 F (37.2 C)] 98.9 F (37.2 C) (08/05 2318) Pulse Rate:  [72-88] 72 (08/05 2318) Resp:  [16-20] 20 (08/05 2318) BP: (101-133)/(42-60) 113/42 (08/05 2318) SpO2:  [94 %-98 %] 94 % (08/05 1518)  Neurologically intact ABD soft Neurovascular intact Sensation intact distally Intact pulses distally Dorsiflexion/Plantar flexion intact Incision: dressing C/D/I and no drainage   LABS Recent Labs    08/24/21 1836 08/25/21 1423 08/26/21 0922  HGB 10.1* 10.3* 10.4*  WBC  --  8.0 7.5  PLT  --  204 259    Recent Labs    08/25/21 1423 08/26/21 0922  NA 140 141  K 4.2 3.9  CL 110 109  CO2 26 24  BUN 10 6  CREATININE 0.86 0.92  GLUCOSE 95 146*    No results for input(s): "LABPT", "INR" in the last 72 hours.   Assessment/Plan: 6 Days Post-Op Procedure(s) (LRB): Removal of the spinal cord stimulator and thoracolumbar fusion T10 to S1 with Transforaminal Lumbar Interbody Fusions left L1-2, L2-3, L3-4 and L4-5, bilateral L5-S1 foramenotomies and left T10-11 foramenotomy this would be done with rods, screws and cages with local bone graft and allograft, Vivigen (N/A)  Advance diet Up with therapy Discharge to SNF  [facility] 08/27/2021, 7:41 AM Patient ID: Debbie Pineda, female   DOB: [birth year], 61 y.o.   MRN: [id number]

## 2021-08-27 NOTE — Progress Notes (Signed)
PTAR on unit to transport pt to [facility]. PTAR given discharge packet and printed prescriptions to transport. Pt daughter transported all of pt belongings to private vehicle.

## 2021-08-27 NOTE — TOC Transition Note (Signed)
Transition of Care [facility]) - CM/SW Discharge Note   Patient Details  Name: Debbie Pineda MRN: [id number] Date of Birth: 1960/03/17  [facility] of [facility] CM/SW Contact:  Alfredia Ferguson, LCSW Phone Number: 08/27/2021, 11:35 AM   Clinical Narrative:    CSW confirmed authorization [id number] and Enzo Bi [phone]. CSW spoke with charge at facility [facility] and received confirmation she can come today with RN report number being [phone]. CSW updated primary team and will begin PTAR process.    Final next level of care: Skilled Nursing Facility Barriers to Discharge: Ship broker, Continued Medical Work up, SNF Pending bed offer   Patient Goals and CMS Choice        Discharge Placement                       Discharge Plan and Services In-house Referral: Clinical Social Work                                   Social Determinants of Health (SDOH) Interventions     Readmission Risk Interventions     No data to display

## 2021-08-27 NOTE — Progress Notes (Signed)
Physical Therapy Treatment Patient Details Name: Debbie Pineda MRN: [id number] DOB: Feb 23, 1960 Today's Date: 08/27/2021   History of Present Illness Patient is a 61 y/o female who presents on 08/21/21 s/p thoracolumbar fusion T10-S1 and TLIF L1-5 and bilateral L5-S1. Noted in MD note - pt on significant pain meds prior to admission and is likely opioid tolerant.  PMH includes CVA, factor V Leiden, chronic pain syndrome, cognitive impairment, DVT, ADD, PFO, OSA on CPAP.    PT Comments    Pt is making good, steady progress with mobility. She is displaying improved standing balance, but remains at risk for falls, requiring up to minA for dynamic standing tasks without UE support and for dynamic gait challenges. Pt requires cues and reminders on how to donn brace and on her spinal precautions. Will continue to follow acutely. Current recommendations remain appropriate.     Recommendations for follow up therapy are one component of a multi-disciplinary discharge planning process, led by the attending physician.  Recommendations may be updated based on patient status, additional functional criteria and insurance authorization.  Follow Up Recommendations  Skilled nursing-short term rehab (<3 hours/day) Can patient physically be transported by private vehicle: Yes   Assistance Recommended at Discharge Intermittent Supervision/Assistance  Patient can return home with the following A little help with walking and/or transfers;A little help with bathing/dressing/bathroom;Assistance with cooking/housework;Direct supervision/assist for medications management;Direct supervision/assist for financial management;Assist for transportation;Help with stairs or ramp for entrance   Equipment Recommendations  None recommended by PT (pt has needed DME)    Recommendations for Other Services       Precautions / Restrictions Precautions Precautions: Back;Fall Precaution Booklet Issued: Yes (comment) Precaution  Comments: reviewed with pt, recall 2/3 (assist to recall twisting) Required Braces or Orthoses: Spinal Brace Spinal Brace: Thoracolumbosacral orthotic;Applied in sitting position Restrictions Weight Bearing Restrictions: No     Mobility  Bed Mobility Overal bed mobility: Needs Assistance Bed Mobility: Rolling, Sidelying to Sit Rolling: Supervision Sidelying to sit: Min guard       General bed mobility comments: supervision to roll, minG with pt using bed rail to push up to sitting, increased time and effort    Transfers Overall transfer level: Needs assistance Equipment used: Rolling walker (2 wheels) Transfers: Sit to/from Stand Sit to Stand: Min guard           General transfer comment: minG with increased time to rise.    Ambulation/Gait Ambulation/Gait assistance: Min guard, Min assist Gait Distance (Feet): 270 Feet Assistive device: Rolling walker (2 wheels) Gait Pattern/deviations: Step-through pattern, Decreased stride length, Trunk flexed, Decreased dorsiflexion - right Gait velocity: reduced Gait velocity interpretation: <1.8 ft/sec, indicate of risk for recurrent falls   General Gait Details: Pt with slow, but mostly steady gait, maintaining a flexed posture and demonstrating a R foot drag intermittently (reports chronic from prior CVA). Cues repeatedly provided to improve upright posture and keep RW proximal, mod momentary success noted. MinA to steady when cuing pt to turn head while ambulating.   Stairs             Wheelchair Mobility    Modified Rankin (Stroke Patients Only)       Balance Overall balance assessment: Needs assistance Sitting-balance support: No upper extremity supported, Feet supported Sitting balance-Leahy Scale: Good Sitting balance - Comments: min g for safety   Standing balance support: Bilateral upper extremity supported, During functional activity, No upper extremity supported Standing balance-Leahy Scale:  Fair Standing balance comment: Able to stand and  adjust back brace with no UE support, but trunk sway noted and minA provided to steady.                            Cognition Arousal/Alertness: Awake/alert Behavior During Therapy: WFL for tasks assessed/performed, Flat affect Overall Cognitive Status: Impaired/Different from baseline Area of Impairment: Memory, Safety/judgement, Problem solving                     Memory: Decreased recall of precautions   Safety/Judgement: Decreased awareness of safety, Decreased awareness of deficits   Problem Solving: Slow processing, Difficulty sequencing, Requires verbal cues General Comments: recall 2/3 precautions. Pt forgot to fasten velcro of brace once placed on, needing cues.        Exercises      General Comments General comments (skin integrity, edema, etc.): provided pt with coloring pictures and markers per her request      Pertinent Vitals/Pain Pain Assessment Pain Assessment: Faces Faces Pain Scale: Hurts little more Pain Location: back Pain Descriptors / Indicators: Discomfort, Sore, Operative site guarding, Sharp Pain Intervention(s): Limited activity within patient's tolerance, Monitored during session, Premedicated before session, Repositioned    Home Living                          Prior Function            PT Goals (current goals can now be found in the care plan section) Acute Rehab PT Goals Patient Stated Goal: to go to SNF for rehab PT Goal Formulation: With patient/family Time For Goal Achievement: 09/05/21 Potential to Achieve Goals: Fair Progress towards PT goals: Progressing toward goals    Frequency    Min 5X/week      PT Plan Current plan remains appropriate    Co-evaluation              AM-PAC PT "6 Clicks" Mobility   Outcome Measure  Help needed turning from your back to your side while in a flat bed without using bedrails?: A Little Help needed moving  from lying on your back to sitting on the side of a flat bed without using bedrails?: A Little Help needed moving to and from a bed to a chair (including a wheelchair)?: A Little Help needed standing up from a chair using your arms (e.g., wheelchair or bedside chair)?: A Little Help needed to walk in hospital room?: A Little Help needed climbing 3-5 steps with a railing? : A Little 6 Click Score: 18    End of Session Equipment Utilized During Treatment: Back brace Activity Tolerance: Patient tolerated treatment well Patient left: with call bell/phone within reach;with family/visitor present;in chair Nurse Communication: Mobility status PT Visit Diagnosis: Pain;Muscle weakness (generalized) (M62.81);Difficulty in walking, not elsewhere classified (R26.2);Unsteadiness on feet (R26.81);Other abnormalities of gait and mobility (R26.89) Pain - part of body:  (back)     Time: 5465-6812 PT Time Calculation (min) (ACUTE ONLY): 34 min  Charges:  $Gait Training: 8-22 mins $Therapeutic Activity: 8-22 mins                     Moishe Spice, PT, DPT Acute Rehabilitation Services  Office: [place] 08/27/2021, 11:57 AM

## 2021-08-28 ENCOUNTER — Non-Acute Institutional Stay (SKILLED_NURSING_FACILITY): Payer: PPO | Admitting: Adult Health

## 2021-08-28 ENCOUNTER — Encounter: Payer: Self-pay | Admitting: Adult Health

## 2021-08-28 ENCOUNTER — Other Ambulatory Visit: Payer: Self-pay | Admitting: Adult Health

## 2021-08-28 DIAGNOSIS — G43709 Chronic migraine without aura, not intractable, without status migrainosus: Secondary | ICD-10-CM

## 2021-08-28 DIAGNOSIS — K219 Gastro-esophageal reflux disease without esophagitis: Secondary | ICD-10-CM

## 2021-08-28 DIAGNOSIS — E7212 Methylenetetrahydrofolate reductase deficiency: Secondary | ICD-10-CM | POA: Diagnosis not present

## 2021-08-28 DIAGNOSIS — M159 Polyosteoarthritis, unspecified: Secondary | ICD-10-CM | POA: Diagnosis not present

## 2021-08-28 DIAGNOSIS — F0390 Unspecified dementia without behavioral disturbance: Secondary | ICD-10-CM

## 2021-08-28 DIAGNOSIS — K5903 Drug induced constipation: Secondary | ICD-10-CM | POA: Diagnosis not present

## 2021-08-28 DIAGNOSIS — T402X5A Adverse effect of other opioids, initial encounter: Secondary | ICD-10-CM

## 2021-08-28 DIAGNOSIS — F5101 Primary insomnia: Secondary | ICD-10-CM

## 2021-08-28 DIAGNOSIS — M797 Fibromyalgia: Secondary | ICD-10-CM | POA: Diagnosis not present

## 2021-08-28 DIAGNOSIS — N3281 Overactive bladder: Secondary | ICD-10-CM

## 2021-08-28 DIAGNOSIS — G4733 Obstructive sleep apnea (adult) (pediatric): Secondary | ICD-10-CM

## 2021-08-28 DIAGNOSIS — G894 Chronic pain syndrome: Secondary | ICD-10-CM | POA: Diagnosis not present

## 2021-08-28 DIAGNOSIS — Z86718 Personal history of other venous thrombosis and embolism: Secondary | ICD-10-CM

## 2021-08-28 DIAGNOSIS — Z9989 Dependence on other enabling machines and devices: Secondary | ICD-10-CM

## 2021-08-28 DIAGNOSIS — F909 Attention-deficit hyperactivity disorder, unspecified type: Secondary | ICD-10-CM | POA: Diagnosis not present

## 2021-08-28 DIAGNOSIS — D62 Acute posthemorrhagic anemia: Secondary | ICD-10-CM

## 2021-08-28 DIAGNOSIS — E039 Hypothyroidism, unspecified: Secondary | ICD-10-CM

## 2021-08-28 MED ORDER — OXYCODONE HCL ER 20 MG PO T12A: 20.0000 mg | EXTENDED_RELEASE_TABLET | Freq: Two times a day (BID) | ORAL | 0 refills | Status: DC

## 2021-08-28 MED ORDER — METHYLPHENIDATE HCL ER (OSM) 27 MG PO TBCR
27.0000 mg | EXTENDED_RELEASE_TABLET | Freq: Two times a day (BID) | ORAL | 0 refills | Status: DC
Start: 2021-08-28 — End: 2021-08-30

## 2021-08-28 MED ORDER — OXYCODONE-ACETAMINOPHEN 10-325 MG PO TABS: 1.0000 | ORAL_TABLET | Freq: Four times a day (QID) | ORAL | 0 refills | Status: DC | PRN

## 2021-08-28 NOTE — Progress Notes (Signed)
Location:  [facility] Room Number: 131-P Place of Service:  SNF (31)   CODE STATUS: Full Code  Allergies  Allergen Reactions   Lyrica [Pregabalin] Shortness Of Breath and Swelling    lower extremity edema and weight gain   Belsomra [Suvorexant] Other (See Comments)    unknown   Morphine And Related Itching    Upper torso   Sulfamethoxazole-Trimethoprim Itching and Rash    Bactrim   Tape Itching and Rash    Please use "paper" tape Rash if left on longer than 24 hrs    Chief Complaint  Patient presents with   Hospitalization Follow-up    HPI:  She is a 61 year old woman who has been hospitalized from 08-21-21 through 08-27-21. Her medical history includes ADD: DJD: morbid obesity. She was hospitalized for Removal of the spinal cord stimulator and thoracolumbar fusion T10 to S1 with Transforaminal Lumbar Interbody Fusions left L1-2, L2-3, L3-4 and L4-5, bilateral L5-S1 foramenotomies and left T10-11 foramenotomy this would be done with rods, screws and cages with local bone graft and allograft, Vivigen. This was done on 08-21-21. She is here for short term rehab with her goal to return back home. She will continue to be followed for her chronic illnesses including:  Attention deficit hyperactivity disorder (ADHD) unspecified ADHD type:  Methylenetetrahydrofolate reductase deficiency  (MTHFR):  Morbid obesity:  Acute on chronic blood loss anemia: Constipation due to opioid therapy:    Past Medical History:  Diagnosis Date   ADD (attention deficit disorder)    Arthritis    "all over"   Cerebral infarction ([place]) 10/30/2011   Cerebrovascular disease 08/14/2016   Chronic back pain    "all over"   Chronic low back pain 46/65/9935   Complication of anesthesia    tends to have hypotension when NPO and post-anesthesia   Constipation    takes stool softener daily   Degenerative disk disease    Degenerative joint disease    DVT (deep venous thrombosis) ([place]) 2014    RLE   Family history of adverse reaction to anesthesia    a family member woke up during surgery; "think it was my mom"   Fibromyalgia    Generalized osteoarthritis of multiple sites 11/04/2013   GERD (gastroesophageal reflux disease)    Headache    Heart palpitations 12/14/2013   resolved   History of blood clots    superficial   Hypoglycemia    Hypothyroid    takes Synthroid daily   Incomplete emptying of bladder    Insomnia    takes Trazodone nightly   Iron deficiency anemia    takes Ferrous Sulfate daily   Joint pain    Joint swelling    knees and ankles   Memory disorder 08/14/2016   Morbid obesity ([place])    Nausea    takes Zofran as needed.Seeing GI doc   Neck pain 05/25/2016   OSA on CPAP    tested more than 5 yrs ago.     Osteoarthritis    Osteopenia    in feet   PFO (patent foramen ovale)    no murmur   Pre-diabetes    Primary osteoarthritis of both feet 05/29/2016   Right bunionectomy August 2017 by Dr. Sharol Given   Scoliosis    Skin abnormality 02/04/2020   raised area on lip   Sleep apnea    mild osa per pt   Stroke [facility]) "several"last 2014   right foot weakness; memory issues, black  spot right visual field since" (03/23/2015)   Thrombophlebitis    Trochanteric bursitis of both hips 05/25/2016   Unilateral primary osteoarthritis, right knee 05/29/2016   Urinary urgency 04/19/2016   Vein disorder 11/29/2010   varicose veins both legs   Wears glasses     Past Surgical History:  Procedure Laterality Date   BIOPSY  11/14/2018   Procedure: BIOPSY;  Surgeon: Debbie Houston, MD;  [facility];  Service: Endoscopy;;  esophagusgastric   BONE EXCISION Right 08/29/2017   Procedure: right trapezium excision;  Surgeon: Debbie Brod, MD;  Location: [facility];  Service: Orthopedics;  Laterality: Right;   BUNIONECTOMY Right 08/2015   CARDIAC CATHETERIZATION     2008.  "it was fine" (not sure why she had it done, and doesn't know where)    CARPOMETACARPEL SUSPENSION PLASTY Right 08/29/2017   Procedure: SUSPENSION PLASTY RIGHT THUMB;  Surgeon: Debbie Brod, MD;  Location: [facility];  Service: Orthopedics;  Laterality: Right;   COLONOSCOPY N/A 03/25/2013   Procedure: COLONOSCOPY;  Surgeon: Debbie Houston, MD;  [facility];  Service: Endoscopy;  Laterality: N/A;  930   ERCP  06/02/2020   ESOPHAGOGASTRODUODENOSCOPY     ESOPHAGOGASTRODUODENOSCOPY (EGD) WITH PROPOFOL N/A 11/14/2018   Procedure: ESOPHAGOGASTRODUODENOSCOPY (EGD) WITH PROPOFOL;  Surgeon: Debbie Houston, MD;  [facility];  Service: Endoscopy;  Laterality: N/A;  1:55pm-office moved to 11:00am/pt notified to arrive at 9:30am per [place]     "took fallopian tubes out"   [place] Right 02/12/2020   Procedure: HALLUX INTERPHANGEAL JOINT  FUSION;  Surgeon: Debbie Pineda, DPM;  Location: [facility];  Service: Podiatry;  Laterality: Right;   HAMMER TOE SURGERY Right 02/12/2020   Procedure: SECOND AND THIRD HAMMER TOE CORRECTION; CAPSULOTOMY SECOND INTERPHALANGEAL JOINT;  Surgeon: Debbie Pineda, DPM;  Location: [facility];  Service: Podiatry;  Laterality: Right;   JOINT REPLACEMENT     bil knee    KNEE ARTHROSCOPY Left    KNEE ARTHROSCOPY W/ ACL RECONSTRUCTION Right yrs ago   "added pins"   LAPAROSCOPIC CHOLECYSTECTOMY  ~ 2001   ROUX-EN-Y GASTRIC BYPASS  11/20/2010   [place]   SPINAL CORD STIMULATOR INSERTION N/A 04/18/2017   Procedure: LUMBAR SPINAL CORD STIMULATOR INSERTION;  Surgeon: Debbie Hakim, MD;  Location: [facility];  Service: Neurosurgery;  Laterality: N/A;  LUMBAR SPINAL CORD STIMULATOR INSERTION   stomach stent  04/28/2020   TENDON TRANSFER Right 08/29/2017   Procedure: right abductor pollicis longus transfer;  Surgeon: Debbie Brod, MD;  Location: [facility];  Service: Orthopedics;  Laterality: Right;   TOTAL KNEE ARTHROPLASTY Left 03/23/2015   Procedure: TOTAL  KNEE ARTHROPLASTY;  Surgeon: Debbie Minion, MD;  Location: [facility];  Service: Orthopedics;  Laterality: Left;   TOTAL KNEE ARTHROPLASTY Right 08/15/2016   Procedure: RIGHT TOTAL KNEE ARTHROPLASTY, REMOVAL ACL SCREWS;  Surgeon: Debbie Minion, MD;  Location: [facility];  Service: Orthopedics;  Laterality: Right;   TOTAL KNEE ARTHROPLASTY WITH HARDWARE REMOVAL Right    VAGINAL HYSTERECTOMY     tah/bso   VARICOSE VEIN SURGERY Right X 2   WEIL OSTEOTOMY Right 02/12/2020   Procedure: DOUBLE L OSTEOTOMY;  Surgeon: Debbie Pineda, DPM;  Location: [facility];  Service: Podiatry;  Laterality: Right;    Social History   Socioeconomic History   Marital status: Married    Spouse name: Jeneen Rinks  Number of children: 1   Years of education: 14   Highest education level: Not on file  Occupational History   Occupation: Disability   Occupation: formerly Therapist, sports, [employer]  Tobacco Use   Smoking status: Former    Packs/day: 0.75    Years: 8.00    Total pack years: 6.00    Types: Cigarettes    Quit date: 12/01/1990    Years since quitting: 30.7    Passive exposure: Past   Smokeless tobacco: Never   Tobacco comments:    quit smoking in the 1990s  Vaping Use   Vaping Use: Never used  Substance and Sexual Activity   Alcohol use: No    Comment: 03/23/2015 "stopped drinking in 2012 w/gastric bypass; drank socially before bypass"   Drug use: No   Sexual activity: Not Currently    Birth control/protection: Surgical  Other Topics Concern   Not on file  Social History Narrative   Lives with husband   Caffeine use: No soda   Mainly water, drinks decaf tea   Right handed   Social Determinants of Health   Financial Resource Strain: Not on file  Food Insecurity: Not on file  Transportation Needs: Not on file  Physical Activity: Not on file  Stress: Not on file  Social Connections: Not on file  Intimate Partner Violence: Not on file   Family History  Problem Relation Age of Onset   Cancer Mother         skin and breast   Myasthenia gravis Mother    Breast cancer Mother    Heart disease Father    Cancer Father    Parkinson's disease Father    Heart disease Sister    Heart attack Sister    Heart disease Brother    Cancer Brother    Diabetes Brother    Stroke Brother    Cancer Maternal Grandfather    Hypothyroidism Daughter    Hypertension Other    Colon cancer Neg Hx       VITAL SIGNS BP 120/68   Pulse 84   Temp 98.1 F (36.7 C)   Resp 20   Ht '5\' 4"'  (1.626 m)   Wt 259 lb 3.2 oz (117.6 kg)   SpO2 100%   BMI 44.49 kg/m   Outpatient Encounter Medications as of 08/28/2021  Medication Sig Note   baclofen (LIORESAL) 10 MG tablet Take 10 mg by mouth 3 (three) times daily.     Cholecalciferol (VITAMIN D3) 125 MCG (5000 UT) TABS Take 5,000 Units by mouth daily.    Cyanocobalamin (VITAMIN B-12) 5000 MCG SUBL Place 5,000 mcg under the tongue daily.     diclofenac sodium (VOLTAREN) 1 % GEL Apply 4 g topically 4 (four) times daily as needed (PAIN).    diphenhydrAMINE (BENADRYL) 50 MG tablet Take 1 tablet (50 mg total) by mouth every 8 (eight) hours as needed for itching.    docusate sodium (COLACE) 100 MG capsule Take 1 capsule (100 mg total) by mouth 2 (two) times daily.    donepezil (ARICEPT) 10 MG tablet Take 1 tablet (10 mg total) by mouth at bedtime.    DULoxetine (CYMBALTA) 60 MG capsule Take 60 mg by mouth 2 (two) times daily.    famotidine (PEPCID) 40 MG tablet Take 1 tablet (40 mg total) by mouth at bedtime.    ferrous gluconate (FERGON) 324 MG tablet Take 1 tablet (324 mg total) by mouth 2 (two) times daily with a meal.  folic acid (FOLVITE) 081 MCG tablet Take 400 mcg by mouth daily.    Glycerin-Hypromellose-PEG 400 (DRY EYE RELIEF DROPS OP) Place 1 drop into both eyes 2 (two) times daily.    Krill Oil 350 MG CAPS Take 350 mg by mouth at bedtime.     levothyroxine (SYNTHROID) 125 MCG tablet Take 125 mcg by mouth daily before breakfast.    lubiprostone (AMITIZA) 24  MCG capsule Take 24 mcg by mouth 2 (two) times daily with a meal.    Methylfol-Methylcob-Acetylcyst (METAFOLBIC PLUS) 6-2-600 MG TABS Take 1 tablet by mouth daily.    methylphenidate (CONCERTA) 27 MG PO CR tablet Take 1 tablet (27 mg total) by mouth 2 (two) times daily. (Patient taking differently: Take 27 mg by mouth 2 (two) times daily. 10 am & 1400)    miconazole (ZEASORB-AF) 2 % powder Every shift as needed. Apply to red areas in folds of skin.    mirabegron ER (MYRBETRIQ) 50 MG TB24 tablet Take 50 mg by mouth daily.    Multiple Minerals-Vitamins (CAL-MAG-ZINC-D PO) Take 3 tablets by mouth daily. 333 mg-133 unit -133 mg-5 mg    Multiple Vitamins-Minerals (ONE-A-DAY WOMENS PETITES PO) Take 1 tablet by mouth 2 (two) times daily.    NARCAN 4 MG/0.1ML LIQD nasal spray kit Place 0.4 mg into the nose once.    omeprazole (PRILOSEC) 40 MG capsule Take one capsule by mouth daily before breaksfast.    ondansetron (ZOFRAN-ODT) 4 MG disintegrating tablet Take 4 mg by mouth every 8 (eight) hours as needed for nausea or vomiting.    oxyCODONE (OXYCONTIN) 20 mg 12 hr tablet Take 1 tablet (20 mg total) by mouth every 12 (twelve) hours.    oxyCODONE 10 MG TABS Take 1 tablet (10 mg total) by mouth every 3 (three) hours as needed for severe pain ((score 7 to 10)).    polyethylene glycol (MIRALAX / GLYCOLAX) 17 g packet Take 17 g by mouth daily as needed for mild constipation or moderate constipation.    Rimegepant Sulfate (NURTEC) 75 MG TBDP Take 75 mg by mouth daily as needed.    rivaroxaban (XARELTO) 20 MG TABS tablet Take 20 mg by mouth daily at 6 PM.    tiZANidine (ZANAFLEX) 4 MG tablet Take 1 tablet (4 mg total) by mouth every 6 (six) hours as needed for muscle spasms.    topiramate (TOPAMAX) 100 MG tablet Take 1 tablet (100 mg total) by mouth 2 (two) times daily.    traZODone (DESYREL) 100 MG tablet Take 50 mg by mouth at bedtime.    trospium (SANCTURA) 20 MG tablet Take 20 mg by mouth 2 (two) times daily.     vitamin C (ASCORBIC ACID) 500 MG tablet Take 500 mg by mouth daily.    diphenhydrAMINE (BENADRYL) 2 % cream Apply 1 Application topically daily as needed for itching (Rash). (Patient not taking: Reported on 08/28/2021)    Galcanezumab-gnlm (EMGALITY) 120 MG/ML SOSY Inject 120 mg into the skin every 30 (thirty) days. (Patient not taking: Reported on 08/28/2021)    hydrocortisone cream 1 % Apply 1 Application topically daily as needed for itching. (Patient not taking: Reported on 08/28/2021)    neomycin-bacitracin-polymyxin (NEOSPORIN) [phone] ointment Apply 1 Application topically daily as needed (sores). (Patient not taking: Reported on 08/28/2021)    PSEUDOEPHEDRINE HCL PO Take 1 tablet by mouth as needed (cold). (Patient not taking: Reported on 08/28/2021)    [DISCONTINUED] amoxicillin (AMOXIL) 500 MG capsule Take 2,000 mg by mouth See admin instructions.  Take 2000 mg by mouth 1 hour prior to dental appointment 08/09/2021: Dental procedures only   No facility-administered encounter medications on file as of 08/28/2021.     SIGNIFICANT DIAGNOSTIC EXAMS  TODAY  08-22-21: wbc 8.5; hgb 10.0; hct 29.4; mcv 95.5 plt 143; glucose 140; bun 7; creat 0.76; k+ 3.7; na++ 139; ca 7.8 gfr >60 08-24-21: wbc 7.9 hgb 7.3; hct 21.8; mcv 96.5 plt 156; glucose 102; bun 8; creat 0.75; k+ 3.9; na++ 139; ca ++ 8.1; gfr >60 08-26-21: wbc 7.5; hgb 10.4; hct 31.2; mcv 93.4 plt 259; glucose 146; bun 6; creat 0.92; k+ 3.9; na++ 141; ca 8.3; gfr >60   Review of Systems  Constitutional:  Negative for malaise/fatigue.  Respiratory:  Negative for cough and shortness of breath.   Cardiovascular:  Negative for chest pain, palpitations and leg swelling.  Gastrointestinal:  Negative for abdominal pain, constipation and heartburn.  Musculoskeletal:  Positive for back pain. Negative for joint pain and myalgias.  Skin: Negative.   Neurological:  Negative for dizziness.  Psychiatric/Behavioral:  The patient is not nervous/anxious.      Physical Exam Constitutional:      General: She is not in acute distress.    Appearance: She is well-developed. She is morbidly obese. She is not diaphoretic.  Neck:     Thyroid: No thyromegaly.  Cardiovascular:     Rate and Rhythm: Normal rate and regular rhythm.     Pulses: Normal pulses.     Heart sounds: Normal heart sounds.  Pulmonary:     Effort: Pulmonary effort is normal. No respiratory distress.     Breath sounds: Normal breath sounds.  Abdominal:     General: Bowel sounds are normal. There is no distension.     Palpations: Abdomen is soft.     Tenderness: There is no abdominal tenderness.  Musculoskeletal:        General: Normal range of motion.     Cervical back: Neck supple.     Right lower leg: No edema.     Left lower leg: No edema.     Comments: Uses back brace when out of bed   Lymphadenopathy:     Cervical: No cervical adenopathy.  Skin:    General: Skin is warm and dry.     Comments: Incision line without signs of infection present   Neurological:     Mental Status: She is alert and oriented to person, place, and time.  Psychiatric:        Mood and Affect: Mood normal.       ASSESSMENT/ PLAN:  TODAY  Generalized osteoarthritis multiple sites/chronic pain syndrome/fibromyalgia : is status post surgical repair on spine. Will continue the following of her pain management: baclofen 10 mg three times daily and zanaflex 4 mg every 6 hours as needed; cymbalta 60 mg twice daily; oxycontin 20 mg twice daily and will change to percocet 10/325 mg every 6 hours as needed  2. Attention deficit hyperactivity disorder (ADHD) unspecified ADHD type: will continue cconcerta 27 mg twice daily   3. Methylenetetrahydrofolate reductase deficiency  (MTHFR): is on multiple vitamin supplements   4. Morbid obesity: BMI 44.49: is status post gastric bypass  5. Acute on chronic blood loss anemia: hgb 10.4 will continue fergon twice daily   6. Constipation due to opioid  therapy: will continue amitiza 24 mcg twice daily; colace twice daily will change to miralax daily   7. GERD without esophagitis: will continue pepcid 40 mg daily and prilosec  40 mg nightly   8. Overactive bladder: will continue myrbetriq 50 mg daily sanctura 20 mg twice daily   9. Acquired hypothyroidism: will continue synthroid 75 mcg daily   10. Chronic migraine without aura without status migrainosus not intractable: will continue topamax 100 mg twice daily emgality 120 mg monthly nurtec 75 mg daily as needed  11. OSA on CPAP uses nightly   12. Hx of blood clots is on long term xarelto 20 mg daily   13. Primary insomnia: will continue trazodone 50 mg nightly   14. Major neurocognitive disorder is on aricept 10 mg daily   Will check hgb/hct      Ok Edwards NP [facility]   call [phone]

## 2021-08-29 ENCOUNTER — Non-Acute Institutional Stay (SKILLED_NURSING_FACILITY): Payer: PPO | Admitting: Internal Medicine

## 2021-08-29 ENCOUNTER — Encounter: Payer: Self-pay | Admitting: Internal Medicine

## 2021-08-29 ENCOUNTER — Other Ambulatory Visit: Payer: Self-pay | Admitting: Adult Health

## 2021-08-29 DIAGNOSIS — D62 Acute posthemorrhagic anemia: Secondary | ICD-10-CM

## 2021-08-29 DIAGNOSIS — Z981 Arthrodesis status: Secondary | ICD-10-CM | POA: Diagnosis not present

## 2021-08-29 DIAGNOSIS — G4733 Obstructive sleep apnea (adult) (pediatric): Secondary | ICD-10-CM

## 2021-08-29 DIAGNOSIS — Z9989 Dependence on other enabling machines and devices: Secondary | ICD-10-CM | POA: Diagnosis not present

## 2021-08-29 DIAGNOSIS — Z9189 Other specified personal risk factors, not elsewhere classified: Secondary | ICD-10-CM | POA: Diagnosis not present

## 2021-08-29 NOTE — Progress Notes (Unsigned)
NURSING HOME LOCATION:  [facility] ROOM NUMBER:  131 P  CODE STATUS:  Full Code  PCP:  Delman Cheadle PA-C  This is a comprehensive admission note to this SNFperformed on this date less than 30 days from date of admission. Included are preadmission medical/surgical history; reconciled medication list; family history; social history and comprehensive review of systems.  Corrections and additions to the records were documented. Comprehensive physical exam was also performed. Additionally a clinical summary was entered for each active diagnosis pertinent to this admission in the Problem List to enhance continuity of care.  HPI: She was hospitalized 7/31 - 08/27/2021 for removal of her spinal cord stimulator and thoracolumbar fusion T10-S1 with transforaminal lumbar interbody fusions of left L1-2, L2-3, L3-4 and L 4-5, bilateral L5-S1's foraminotomies and left T10-11 for rhizotomy with rods, screws, and cages and local bone graft and allograft on 7/31.  She received perioperative antibiotics.  Postoperatively she received sequential compression devices, early ambulation, and chemoprophylaxis for DVT prophylaxis. Prior to admission creatinine been 1.14 with a GFR of 56 indicating CKD stage IIIa.  Final creatinine was 0.92 with GFR greater than 60 indicating CKD stage II.  Preop CBC was normal; postop she had mild anemia with H/H of 10.4/31.2.  While hospitalized glucoses ranged from a low of 94 up to high of 146.  Past medical and surgical history is long and complicated and includes ADD, history of cerebral infarction, chronic back pain, history of DVT, fibromyalgia, GERD, history of iron deficiency anemia, morbid obesity, OSA, history of PFO, and history of osteopenia. Surgery and procedures include cardiac catheterization, colonoscopy, ERCP, gastric stenting, and hysterectomy.  Social history: Nondrinker, non-smoker.  Family history: Extensive history reviewed.  There is a  strong family history of malignancy and heart disease.   Review of systems: Clinical neurocognitive deficits made validity of responses questionable & preventing ROS completion. Date given as Constitutional: No fever, significant weight change, fatigue  Eyes: No redness, discharge, pain, vision change ENT/mouth: No nasal congestion, purulent discharge, earache, change in hearing, sore throat  Cardiovascular: No chest pain, palpitations, paroxysmal nocturnal dyspnea, claudication, edema  Respiratory: No cough, sputum production, hemoptysis, DOE, significant snoring, apnea Gastrointestinal: No heartburn, dysphagia, abdominal pain, nausea /vomiting, rectal bleeding, melena, change in bowels Genitourinary: No dysuria, hematuria, pyuria, incontinence, nocturia Musculoskeletal: No joint stiffness, joint swelling, weakness, pain Dermatologic: No rash, pruritus, change in appearance of skin Neurologic: No dizziness, headache, syncope, seizures, numbness, tingling Psychiatric: No significant anxiety, depression, insomnia, anorexia Endocrine: No change in hair/skin/nails, excessive thirst, excessive hunger, excessive urination  Hematologic/lymphatic: No significant bruising, lymphadenopathy, abnormal bleeding Allergy/immunology: No itchy/watery eyes, significant sneezing, urticaria, angioedema  Physical exam:  Pertinent or positive findings: General appearance: Adequately nourished; no acute distress, increased work of breathing is present.   Lymphatic: No lymphadenopathy about the head, neck, axilla. Eyes: No conjunctival inflammation or lid edema is present. There is no scleral icterus. Ears:  External ear exam shows no significant lesions or deformities.   Nose:  External nasal examination shows no deformity or inflammation. Nasal mucosa are pink and moist without lesions, exudates Oral exam: Lips and gums are healthy appearing.There is no oropharyngeal erythema or exudate. Neck:  No thyromegaly,  masses, tenderness noted.    Heart:  Normal rate and regular rhythm. S1 and S2 normal without gallop, murmur, click, rub.  Lungs: Chest clear to auscultation without wheezes, rhonchi, rales, rubs. Abdomen: Bowel sounds are normal.  Abdomen is soft and nontender with no organomegaly,  hernias, masses. GU: Deferred  Extremities:  No cyanosis, clubbing, edema. Neurologic exam:  Strength equal  in upper & lower extremities. Balance, Rhomberg, finger to nose testing could not be completed due to clinical state Deep tendon reflexes are equal Skin: Warm & dry w/o tenting. No significant lesions or rash.  See clinical summary under each active problem in the Problem List with associated updated therapeutic plan

## 2021-08-30 ENCOUNTER — Non-Acute Institutional Stay (SKILLED_NURSING_FACILITY): Payer: PPO | Admitting: Adult Health

## 2021-08-30 ENCOUNTER — Other Ambulatory Visit: Payer: Self-pay | Admitting: Adult Health

## 2021-08-30 ENCOUNTER — Encounter: Payer: Self-pay | Admitting: Adult Health

## 2021-08-30 DIAGNOSIS — Z981 Arthrodesis status: Secondary | ICD-10-CM | POA: Diagnosis not present

## 2021-08-30 DIAGNOSIS — M797 Fibromyalgia: Secondary | ICD-10-CM | POA: Diagnosis not present

## 2021-08-30 DIAGNOSIS — R413 Other amnesia: Secondary | ICD-10-CM

## 2021-08-30 DIAGNOSIS — E7212 Methylenetetrahydrofolate reductase deficiency: Secondary | ICD-10-CM | POA: Diagnosis not present

## 2021-08-30 DIAGNOSIS — M159 Polyosteoarthritis, unspecified: Secondary | ICD-10-CM

## 2021-08-30 MED ORDER — OXYCODONE-ACETAMINOPHEN 10-325 MG PO TABS: 1.0000 | ORAL_TABLET | Freq: Four times a day (QID) | ORAL | 0 refills | Status: DC | PRN

## 2021-08-30 MED ORDER — METHYLPHENIDATE HCL ER (OSM) 27 MG PO TBCR
27.0000 mg | EXTENDED_RELEASE_TABLET | Freq: Two times a day (BID) | ORAL | 0 refills | Status: AC
Start: 2021-08-30 — End: ?

## 2021-08-30 MED ORDER — DULOXETINE HCL 60 MG PO CPEP: 60.0000 mg | ORAL_CAPSULE | Freq: Two times a day (BID) | ORAL | 0 refills | Status: DC

## 2021-08-30 MED ORDER — TRAZODONE HCL 100 MG PO TABS
50.0000 mg | ORAL_TABLET | Freq: Every day | ORAL | 0 refills | Status: AC
Start: 2021-08-30 — End: ?

## 2021-08-30 MED ORDER — MIRABEGRON ER 50 MG PO TB24: 50.0000 mg | ORAL_TABLET | Freq: Every day | ORAL | 0 refills | Status: AC

## 2021-08-30 MED ORDER — METAFOLBIC PLUS 6-2-600 MG PO TABS: 1.0000 | ORAL_TABLET | Freq: Every day | ORAL | 0 refills | Status: DC

## 2021-08-30 MED ORDER — OXYCODONE HCL ER 20 MG PO T12A: 20.0000 mg | EXTENDED_RELEASE_TABLET | Freq: Two times a day (BID) | ORAL | 0 refills | Status: DC

## 2021-08-30 MED ORDER — LEVOTHYROXINE SODIUM 125 MCG PO TABS
125.0000 ug | ORAL_TABLET | Freq: Every day | ORAL | 0 refills | Status: AC
Start: 2021-08-30 — End: ?

## 2021-08-30 MED ORDER — BACLOFEN 10 MG PO TABS
10.0000 mg | ORAL_TABLET | Freq: Three times a day (TID) | ORAL | 0 refills | Status: AC
Start: 2021-08-30 — End: ?

## 2021-08-30 MED ORDER — NURTEC 75 MG PO TBDP: 75.0000 mg | ORAL_TABLET | Freq: Every day | ORAL | 0 refills | Status: DC | PRN

## 2021-08-30 MED ORDER — FERROUS GLUCONATE 324 (38 FE) MG PO TABS: 324.0000 mg | ORAL_TABLET | Freq: Two times a day (BID) | ORAL | 0 refills | Status: DC

## 2021-08-30 MED ORDER — TROSPIUM CHLORIDE 20 MG PO TABS
20.0000 mg | ORAL_TABLET | Freq: Two times a day (BID) | ORAL | 0 refills | Status: AC
Start: 2021-08-30 — End: ?

## 2021-08-30 MED ORDER — TOPIRAMATE 100 MG PO TABS
100.0000 mg | ORAL_TABLET | Freq: Two times a day (BID) | ORAL | 0 refills | Status: DC
Start: 2021-08-30 — End: 2021-11-07

## 2021-08-30 MED ORDER — TIZANIDINE HCL 4 MG PO TABS: 4.0000 mg | ORAL_TABLET | Freq: Four times a day (QID) | ORAL | 0 refills | Status: AC | PRN

## 2021-08-30 MED ORDER — DONEPEZIL HCL 10 MG PO TABS: 10.0000 mg | ORAL_TABLET | Freq: Every day | ORAL | 0 refills | Status: DC

## 2021-08-30 MED ORDER — FAMOTIDINE 40 MG PO TABS
40.0000 mg | ORAL_TABLET | Freq: Every day | ORAL | 0 refills | Status: DC
Start: 2021-08-30 — End: 2022-06-19

## 2021-08-30 MED ORDER — NARCAN 4 MG/0.1ML NA LIQD
0.4000 mg | Freq: Two times a day (BID) | NASAL | 0 refills | Status: AC | PRN
Start: 2021-08-30 — End: ?

## 2021-08-30 MED ORDER — ONDANSETRON 4 MG PO TBDP: 4.0000 mg | ORAL_TABLET | Freq: Three times a day (TID) | ORAL | 0 refills | Status: AC | PRN

## 2021-08-30 MED ORDER — RIVAROXABAN 20 MG PO TABS: 20.0000 mg | ORAL_TABLET | Freq: Every day | ORAL | 0 refills | Status: AC

## 2021-08-30 MED ORDER — OMEPRAZOLE 40 MG PO CPDR: DELAYED_RELEASE_CAPSULE | ORAL | 0 refills | Status: DC

## 2021-08-30 MED ORDER — LUBIPROSTONE 24 MCG PO CAPS
24.0000 ug | ORAL_CAPSULE | Freq: Two times a day (BID) | ORAL | 0 refills | Status: DC
Start: 2021-08-30 — End: 2021-10-11

## 2021-08-30 NOTE — Progress Notes (Signed)
Location:  [place] Room Number: 768 Place of Service:  SNF [id number])  Provider: Ok Edwards np   PCP: Jake Samples, PA-C Patient Care Team: [name] as PCP - General (Family Medicine) Josue Hector, MD as PCP - Cardiology (Cardiology) Gaynelle Arabian, MD as Consulting Physician (Orthopedic Surgery) Nani Skillern, PA-C as Physician Assistant (Internal Medicine) Himmelrich, Bryson Ha, RD (Inactive) as Dietitian Bo Merino, MD as Consulting Physician (Rheumatology) Newt Minion, MD as Consulting Physician (Orthopedic Surgery) Domingo Pulse, MD as Consulting Physician (Urology) Jessy Oto, MD as Consulting Physician (Orthopedic Surgery)  Extended Emergency Contact Information Primary Emergency Contact: Finder,James Address: [address]          [place], [place] [address] Johnnette Litter of [place] Work Phone: [phone] Mobile Phone: [phone] Relation: Spouse Secondary Emergency Contact: Lurena Nida Address: [address]          [place] , [place] [address] [place] of [place] Phone: [phone] Relation: Daughter  Code Status: full  Goals of care:  Advanced Directive information    08/28/2021   10:58 AM  Advanced Directives  Does Patient Have a Medical Advance Directive? No  Would patient like information on creating a medical advance directive? No - Patient declined     Allergies  Allergen Reactions   Lyrica [Pregabalin] Shortness Of Breath and Swelling    lower extremity edema and weight gain   Belsomra [Suvorexant] Other (See Comments)    unknown   Morphine And Related Itching    Upper torso   Sulfamethoxazole-Trimethoprim Itching and Rash    Bactrim   Tape Itching and Rash    Please use "paper" tape Rash if left on longer than 24 hrs    Chief Complaint  Patient presents with   Discharge Note    HPI:  61 y.o. female  being discharged to home with home health for pt/ot. She will  need her prescriptions written and will need to follow up with her medical provider. She had been hospitalized for spinal surgery. She was admitted to this facility for short term rehab. She tells [place] that she is ready to return back home.     Past Medical History:  Diagnosis Date   ADD (attention deficit disorder)    Arthritis    "all over"   Cerebral infarction ([place]) 10/30/2011   Cerebrovascular disease 08/14/2016   Chronic back pain    "all over"   Chronic low back pain 32/91/9166   Complication of anesthesia    tends to have hypotension when NPO and post-anesthesia   Constipation    takes stool softener daily   Degenerative disk disease    Degenerative joint disease    DVT (deep venous thrombosis) ([place]) 2014   RLE   Family history of adverse reaction to anesthesia    a family member woke up during surgery; "think it was my mom"   Fibromyalgia    Generalized osteoarthritis of multiple sites 11/04/2013   GERD (gastroesophageal reflux disease)    Headache    Heart palpitations 12/14/2013   resolved   History of blood clots    superficial   Hypoglycemia    Hypothyroid    takes Synthroid daily   Incomplete emptying of bladder    Insomnia    takes Trazodone nightly   Iron deficiency anemia    takes Ferrous Sulfate daily   Joint pain    Joint swelling    knees and ankles  Memory disorder 08/14/2016   Morbid obesity ([place])    Nausea    takes Zofran as needed.Seeing GI doc   Neck pain 05/25/2016   OSA on CPAP    tested more than 5 yrs ago.     Osteoarthritis    Osteopenia    in feet   PFO (patent foramen ovale)    no murmur   Pre-diabetes    Primary osteoarthritis of both feet 05/29/2016   Right bunionectomy August 2017 by Dr. Sharol Given   Scoliosis    Skin abnormality 02/04/2020   raised area on lip   Stroke [facility]) "several"last 2014   right foot weakness; memory issues, black spot right visual field since" (03/23/2015)   Thrombophlebitis    Trochanteric bursitis of  both hips 05/25/2016   Unilateral primary osteoarthritis, right knee 05/29/2016   Urinary urgency 04/19/2016   Vein disorder 11/29/2010   varicose veins both legs   Wears glasses     Past Surgical History:  Procedure Laterality Date   BIOPSY  11/14/2018   Procedure: BIOPSY;  Surgeon: Rogene Houston, MD;  [facility];  Service: Endoscopy;;  esophagusgastric   BONE EXCISION Right 08/29/2017   Procedure: right trapezium excision;  Surgeon: Daryll Brod, MD;  Location: [place];  Service: Orthopedics;  Laterality: Right;   BUNIONECTOMY Right 08/2015   CARDIAC CATHETERIZATION     2008.  "it was fine" (not sure why she had it done, and doesn't know where)   CARPOMETACARPEL SUSPENSION PLASTY Right 08/29/2017   Procedure: SUSPENSION PLASTY RIGHT THUMB;  Surgeon: Daryll Brod, MD;  Location: [place];  Service: Orthopedics;  Laterality: Right;   COLONOSCOPY N/A 03/25/2013   Procedure: COLONOSCOPY;  Surgeon: Rogene Houston, MD;  [facility];  Service: Endoscopy;  Laterality: N/A;  930   ERCP  06/02/2020   ESOPHAGOGASTRODUODENOSCOPY     ESOPHAGOGASTRODUODENOSCOPY (EGD) WITH PROPOFOL N/A 11/14/2018   Procedure: ESOPHAGOGASTRODUODENOSCOPY (EGD) WITH PROPOFOL;  Surgeon: Rogene Houston, MD;  [facility];  Service: Endoscopy;  Laterality: N/A;  1:55pm-office moved to 11:00am/pt notified to arrive at 9:30am per [place]     "took fallopian tubes out"   [place] Right 02/12/2020   Procedure: HALLUX INTERPHANGEAL JOINT  FUSION;  Surgeon: Felipa Furnace, DPM;  Location: [place];  Service: Podiatry;  Laterality: Right;   HAMMER TOE SURGERY Right 02/12/2020   Procedure: SECOND AND THIRD HAMMER TOE CORRECTION; CAPSULOTOMY SECOND INTERPHALANGEAL JOINT;  Surgeon: Felipa Furnace, DPM;  Location: [place];  Service: Podiatry;  Laterality: Right;   JOINT REPLACEMENT     bil knee    KNEE  ARTHROSCOPY Left    KNEE ARTHROSCOPY W/ ACL RECONSTRUCTION Right yrs ago   "added pins"   LAPAROSCOPIC CHOLECYSTECTOMY  ~ 2001   ROUX-EN-Y GASTRIC BYPASS  11/20/2010   [place]   SPINAL CORD STIMULATOR INSERTION N/A 04/18/2017   Procedure: LUMBAR SPINAL CORD STIMULATOR INSERTION;  Surgeon: Clydell Hakim, MD;  Location: [facility];  Service: Neurosurgery;  Laterality: N/A;  LUMBAR SPINAL CORD STIMULATOR INSERTION   stomach stent  04/28/2020   TENDON TRANSFER Right 08/29/2017   Procedure: right abductor pollicis longus transfer;  Surgeon: Daryll Brod, MD;  Location: [facility];  Service: Orthopedics;  Laterality: Right;   TOTAL KNEE ARTHROPLASTY Left 03/23/2015   Procedure: TOTAL KNEE ARTHROPLASTY;  Surgeon: Newt Minion, MD;  Location: [facility];  Service:  Orthopedics;  Laterality: Left;   TOTAL KNEE ARTHROPLASTY Right 08/15/2016   Procedure: RIGHT TOTAL KNEE ARTHROPLASTY, REMOVAL ACL SCREWS;  Surgeon: Newt Minion, MD;  Location: [facility];  Service: Orthopedics;  Laterality: Right;   TOTAL KNEE ARTHROPLASTY WITH HARDWARE REMOVAL Right    VAGINAL HYSTERECTOMY     tah/bso   VARICOSE VEIN SURGERY Right X 2   WEIL OSTEOTOMY Right 02/12/2020   Procedure: DOUBLE L OSTEOTOMY;  Surgeon: Felipa Furnace, DPM;  Location: [facility] [place];  Service: Podiatry;  Laterality: Right;      reports that she quit smoking about 30 years ago. Her smoking use included cigarettes. She has a 6.00 pack-year smoking history. She has been exposed to tobacco smoke. She has never used smokeless tobacco. She reports that she does not drink alcohol and does not use drugs. Social History   Socioeconomic History   Marital status: Married    Spouse name: Jeneen Rinks   Number of children: 1   Years of education: 14   Highest education level: Not on file  Occupational History   Occupation: Disability   Occupation: formerly Therapist, sports, [employer]  Tobacco Use   Smoking status: Former    Packs/day: 0.75    Years: 8.00     Total pack years: 6.00    Types: Cigarettes    Quit date: 12/01/1990    Years since quitting: 30.7    Passive exposure: Past   Smokeless tobacco: Never   Tobacco comments:    quit smoking in the 1990s  Vaping Use   Vaping Use: Never used  Substance and Sexual Activity   Alcohol use: No    Comment: 03/23/2015 "stopped drinking in 2012 w/gastric bypass; drank socially before bypass"   Drug use: No   Sexual activity: Not Currently    Birth control/protection: Surgical  Other Topics Concern   Not on file  Social History Narrative   Lives with husband   Caffeine use: No soda   Mainly water, drinks decaf tea   Right handed   Social Determinants of Health   Financial Resource Strain: Not on file  Food Insecurity: Not on file  Transportation Needs: Not on file  Physical Activity: Not on file  Stress: Not on file  Social Connections: Not on file  Intimate Partner Violence: Not on file   Functional Status Survey:    Allergies  Allergen Reactions   Lyrica [Pregabalin] Shortness Of Breath and Swelling    lower extremity edema and weight gain   Belsomra [Suvorexant] Other (See Comments)    unknown   Morphine And Related Itching    Upper torso   Sulfamethoxazole-Trimethoprim Itching and Rash    Bactrim   Tape Itching and Rash    Please use "paper" tape Rash if left on longer than 24 hrs    Pertinent  Health Maintenance Due  Topic Date Due   FOOT EXAM  Never done   OPHTHALMOLOGY EXAM  Never done   URINE MICROALBUMIN  Never done   PAP SMEAR-Modifier  Never done   HEMOGLOBIN A1C  11/01/2017   MAMMOGRAM  03/29/2021   INFLUENZA VACCINE  08/22/2021   COLONOSCOPY (Pts 45-57yr Insurance coverage will need to be confirmed)  03/26/2023    Medications: Outpatient Encounter Medications as of 08/30/2021  Medication Sig   baclofen (LIORESAL) 10 MG tablet Take 1 tablet (10 mg total) by mouth 3 (three) times daily.   Cholecalciferol (VITAMIN D3) 125 MCG (5000 UT) TABS Take 5,000  Units by  mouth daily.   Cyanocobalamin (VITAMIN B-12) 5000 MCG SUBL Place 5,000 mcg under the tongue daily.    diclofenac sodium (VOLTAREN) 1 % GEL Apply 4 g topically 4 (four) times daily as needed (PAIN).   diphenhydrAMINE (BENADRYL) 2 % cream Apply 1 Application topically daily as needed for itching (Rash). (Patient not taking: Reported on 08/28/2021)   diphenhydrAMINE (BENADRYL) 50 MG tablet Take 1 tablet (50 mg total) by mouth every 8 (eight) hours as needed for itching.   docusate sodium (COLACE) 100 MG capsule Take 1 capsule (100 mg total) by mouth 2 (two) times daily.   donepezil (ARICEPT) 10 MG tablet Take 1 tablet (10 mg total) by mouth at bedtime.   DULoxetine (CYMBALTA) 60 MG capsule Take 1 capsule (60 mg total) by mouth 2 (two) times daily.   famotidine (PEPCID) 40 MG tablet Take 1 tablet (40 mg total) by mouth at bedtime.   ferrous gluconate (FERGON) 324 MG tablet Take 1 tablet (324 mg total) by mouth 2 (two) times daily with a meal.   folic acid (FOLVITE) 376 MCG tablet Take 400 mcg by mouth daily.   Galcanezumab-gnlm (EMGALITY) 120 MG/ML SOSY Inject 120 mg into the skin every 30 (thirty) days. (Patient not taking: Reported on 08/28/2021)   Glycerin-Hypromellose-PEG 400 (DRY EYE RELIEF DROPS OP) Place 1 drop into both eyes 2 (two) times daily.   hydrocortisone cream 1 % Apply 1 Application topically daily as needed for itching. (Patient not taking: Reported on 08/28/2021)   Krill Oil 350 MG CAPS Take 350 mg by mouth at bedtime.    levothyroxine (SYNTHROID) 125 MCG tablet Take 1 tablet (125 mcg total) by mouth daily before breakfast.   lubiprostone (AMITIZA) 24 MCG capsule Take 1 capsule (24 mcg total) by mouth 2 (two) times daily with a meal.   Methylfol-Methylcob-Acetylcyst (METAFOLBIC PLUS) 6-2-600 MG TABS Take 1 tablet by mouth daily.   methylphenidate (CONCERTA) 27 MG PO CR tablet Take 1 tablet (27 mg total) by mouth 2 (two) times daily. 10 am & 1400   miconazole (ZEASORB-AF) 2 %  powder Every shift as needed. Apply to red areas in folds of skin.   mirabegron ER (MYRBETRIQ) 50 MG TB24 tablet Take 1 tablet (50 mg total) by mouth daily.   Multiple Minerals-Vitamins (CAL-MAG-ZINC-D PO) Take 3 tablets by mouth daily. 333 mg-133 unit -133 mg-5 mg   Multiple Vitamins-Minerals (ONE-A-DAY WOMENS PETITES PO) Take 1 tablet by mouth 2 (two) times daily.   NARCAN 4 MG/0.1ML LIQD nasal spray kit Place 0.1 sprays (0.4 mg total) into the nose 2 (two) times daily as needed.   neomycin-bacitracin-polymyxin (NEOSPORIN) [phone] ointment Apply 1 Application topically daily as needed (sores). (Patient not taking: Reported on 08/28/2021)   omeprazole (PRILOSEC) 40 MG capsule Take one capsule by mouth daily before breaksfast.   ondansetron (ZOFRAN-ODT) 4 MG disintegrating tablet Take 1 tablet (4 mg total) by mouth every 8 (eight) hours as needed for nausea or vomiting.   oxyCODONE (OXYCONTIN) 20 mg 12 hr tablet Take 1 tablet (20 mg total) by mouth every 12 (twelve) hours.   oxyCODONE-acetaminophen (PERCOCET) 10-325 MG tablet Take 1 tablet by mouth every 6 (six) hours as needed for pain.   polyethylene glycol (MIRALAX / GLYCOLAX) 17 g packet Take 17 g by mouth daily as needed for mild constipation or moderate constipation.   PSEUDOEPHEDRINE HCL PO Take 1 tablet by mouth as needed (cold). (Patient not taking: Reported on 08/28/2021)   Rimegepant Sulfate (NURTEC) 75 MG TBDP  Take 75 mg by mouth daily as needed.   rivaroxaban (XARELTO) 20 MG TABS tablet Take 1 tablet (20 mg total) by mouth daily at 6 PM.   tiZANidine (ZANAFLEX) 4 MG tablet Take 1 tablet (4 mg total) by mouth every 6 (six) hours as needed for muscle spasms.   topiramate (TOPAMAX) 100 MG tablet Take 1 tablet (100 mg total) by mouth 2 (two) times daily.   traZODone (DESYREL) 100 MG tablet Take 0.5 tablets (50 mg total) by mouth at bedtime.   trospium (SANCTURA) 20 MG tablet Take 1 tablet (20 mg total) by mouth 2 (two) times daily.    vitamin C (ASCORBIC ACID) 500 MG tablet Take 500 mg by mouth daily.   No facility-administered encounter medications on file as of 08/30/2021.     Vitals:   08/30/21 1446  BP: 108/65  Pulse: 74  Resp: 20  Temp: (!) 97.4 F (36.3 C)  SpO2: 98%  Weight: 260 lb (117.9 kg)  Height: '5\' 4"'  (1.626 m)   Body mass index is 44.63 kg/m.     SIGNIFICANT DIAGNOSTIC EXAMS  TODAY  08-22-21: wbc 8.5; hgb 10.0; hct 29.4; mcv 95.5 plt 143; glucose 140; bun 7; creat 0.76; k+ 3.7; na++ 139; ca 7.8 gfr >60 08-24-21: wbc 7.9 hgb 7.3; hct 21.8; mcv 96.5 plt 156; glucose 102; bun 8; creat 0.75; k+ 3.9; na++ 139; ca ++ 8.1; gfr >60 08-26-21: wbc 7.5; hgb 10.4; hct 31.2; mcv 93.4 plt 259; glucose 146; bun 6; creat 0.92; k+ 3.9; na++ 141; ca 8.3; gfr >60   Review of Systems  Constitutional:  Negative for malaise/fatigue.  Respiratory:  Negative for cough and shortness of breath.   Cardiovascular:  Negative for chest pain, palpitations and leg swelling.  Gastrointestinal:  Negative for abdominal pain, constipation and heartburn.  Musculoskeletal:  Negative for back pain, joint pain and myalgias.  Skin: Negative.   Neurological:  Negative for dizziness.  Psychiatric/Behavioral:  The patient is not nervous/anxious.    Physical Exam Constitutional:      General: She is not in acute distress.    Appearance: She is well-developed. She is morbidly obese. She is not diaphoretic.  Neck:     Thyroid: No thyromegaly.  Cardiovascular:     Rate and Rhythm: Normal rate and regular rhythm.     Pulses: Normal pulses.     Heart sounds: Normal heart sounds.  Pulmonary:     Effort: Pulmonary effort is normal. No respiratory distress.     Breath sounds: Normal breath sounds.  Abdominal:     General: Bowel sounds are normal. There is no distension.     Palpations: Abdomen is soft.     Tenderness: There is no abdominal tenderness.  Musculoskeletal:     Cervical back: Neck supple.     Right lower leg: No edema.      Left lower leg: No edema.     Comments:  Uses back brace when out of bed    Lymphadenopathy:     Cervical: No cervical adenopathy.  Skin:    General: Skin is warm and dry.  Neurological:     Mental Status: She is alert and oriented to person, place, and time.  Psychiatric:        Mood and Affect: Mood normal.      Assessment/Plan:    Patient is being discharged with the following home health services:  pt/ot to evaluate and treat as indicated for gait balance strength adl training.  Patient is being discharged with the following durable medical equipment:  no dme needed   Patient has been advised to f/u with their PCP in 1-2 weeks to for a transitions of care visit.  Social services at their facility was responsible for arranging this appointment.  Pt was provided with adequate prescriptions of noncontrolled medications to reach the scheduled appointment .  For controlled substances, a limited supply was provided as appropriate for the individual patient.  If the pt normally receives these medications from a pain clinic or has a contract with another physician, these medications should be received from that clinic or physician only).    A 30 day supply of her prescription medications have been sent to [facility]   Time spent with patient: 35 minutes: medications; home health; dm   Ok Edwards NP [facility]   call [phone]

## 2021-08-31 DIAGNOSIS — Z9189 Other specified personal risk factors, not elsewhere classified: Secondary | ICD-10-CM | POA: Insufficient documentation

## 2021-08-31 NOTE — Assessment & Plan Note (Signed)
Follow SOC concerning post op narcotic use due to significant associated risks.

## 2021-08-31 NOTE — Assessment & Plan Note (Addendum)
She states that she is 100% compliant with CPAP nightly.

## 2021-08-31 NOTE — Assessment & Plan Note (Signed)
PT/OT at SNF as tolerated.  Adhere to [facility] for opioid use postop because of multiple comorbidities and high risk of opioid related complications.

## 2021-08-31 NOTE — Patient Instructions (Signed)
See assessment and plan under each diagnosis in the problem list and acutely for this visit 

## 2021-08-31 NOTE — Assessment & Plan Note (Addendum)
Pre extensive spinal surgery CBC was normal; postop mild anemia was present with H/H of 10.4/31.2.  She is on Xarelto because of an uncorrected PFO.  Monitor for any bleeding dyscrasias at the SNF.

## 2021-09-01 ENCOUNTER — Other Ambulatory Visit: Payer: Self-pay | Admitting: Specialist

## 2021-09-01 ENCOUNTER — Telehealth: Payer: Self-pay | Admitting: Specialist

## 2021-09-01 DIAGNOSIS — Z981 Arthrodesis status: Secondary | ICD-10-CM | POA: Diagnosis not present

## 2021-09-01 DIAGNOSIS — Z8673 Personal history of transient ischemic attack (TIA), and cerebral infarction without residual deficits: Secondary | ICD-10-CM | POA: Diagnosis not present

## 2021-09-01 DIAGNOSIS — Z4789 Encounter for other orthopedic aftercare: Secondary | ICD-10-CM | POA: Diagnosis not present

## 2021-09-01 DIAGNOSIS — M419 Scoliosis, unspecified: Secondary | ICD-10-CM | POA: Diagnosis not present

## 2021-09-01 DIAGNOSIS — N3281 Overactive bladder: Secondary | ICD-10-CM | POA: Diagnosis not present

## 2021-09-01 DIAGNOSIS — K5903 Drug induced constipation: Secondary | ICD-10-CM | POA: Diagnosis not present

## 2021-09-01 DIAGNOSIS — K219 Gastro-esophageal reflux disease without esophagitis: Secondary | ICD-10-CM | POA: Diagnosis not present

## 2021-09-01 DIAGNOSIS — Z7901 Long term (current) use of anticoagulants: Secondary | ICD-10-CM | POA: Diagnosis not present

## 2021-09-01 DIAGNOSIS — G4733 Obstructive sleep apnea (adult) (pediatric): Secondary | ICD-10-CM | POA: Diagnosis not present

## 2021-09-01 DIAGNOSIS — G894 Chronic pain syndrome: Secondary | ICD-10-CM | POA: Diagnosis not present

## 2021-09-01 DIAGNOSIS — M519 Unspecified thoracic, thoracolumbar and lumbosacral intervertebral disc disorder: Secondary | ICD-10-CM | POA: Diagnosis not present

## 2021-09-01 DIAGNOSIS — F5101 Primary insomnia: Secondary | ICD-10-CM | POA: Diagnosis not present

## 2021-09-01 DIAGNOSIS — Z90710 Acquired absence of both cervix and uterus: Secondary | ICD-10-CM | POA: Diagnosis not present

## 2021-09-01 DIAGNOSIS — M159 Polyosteoarthritis, unspecified: Secondary | ICD-10-CM | POA: Diagnosis not present

## 2021-09-01 DIAGNOSIS — Z86711 Personal history of pulmonary embolism: Secondary | ICD-10-CM | POA: Diagnosis not present

## 2021-09-01 DIAGNOSIS — F909 Attention-deficit hyperactivity disorder, unspecified type: Secondary | ICD-10-CM | POA: Diagnosis not present

## 2021-09-01 DIAGNOSIS — M8580 Other specified disorders of bone density and structure, unspecified site: Secondary | ICD-10-CM | POA: Diagnosis not present

## 2021-09-01 DIAGNOSIS — G43709 Chronic migraine without aura, not intractable, without status migrainosus: Secondary | ICD-10-CM | POA: Diagnosis not present

## 2021-09-01 DIAGNOSIS — R7303 Prediabetes: Secondary | ICD-10-CM | POA: Diagnosis not present

## 2021-09-01 DIAGNOSIS — E039 Hypothyroidism, unspecified: Secondary | ICD-10-CM | POA: Diagnosis not present

## 2021-09-01 DIAGNOSIS — D62 Acute posthemorrhagic anemia: Secondary | ICD-10-CM | POA: Diagnosis not present

## 2021-09-01 DIAGNOSIS — Z96653 Presence of artificial knee joint, bilateral: Secondary | ICD-10-CM | POA: Diagnosis not present

## 2021-09-01 DIAGNOSIS — M797 Fibromyalgia: Secondary | ICD-10-CM | POA: Diagnosis not present

## 2021-09-01 MED ORDER — ONDANSETRON HCL 4 MG PO TABS: 4.0000 mg | ORAL_TABLET | Freq: Three times a day (TID) | ORAL | 0 refills | Status: DC | PRN

## 2021-09-01 MED ORDER — OXYCODONE-ACETAMINOPHEN 10-325 MG PO TABS: 1.0000 | ORAL_TABLET | Freq: Four times a day (QID) | ORAL | 0 refills | Status: DC | PRN

## 2021-09-01 NOTE — Telephone Encounter (Signed)
I called and spoke with patient--pain meds called into [facility]--Per Dr. Louanne Skye she should see her PCP about the nausea as it could be medicine related or a GI issue. She agrees and will call them

## 2021-09-01 NOTE — Addendum Note (Signed)
Addended by: Minda Ditto, Alyse Low N on: 09/01/2021 01:05 PM   Modules accepted: Orders

## 2021-09-01 NOTE — Telephone Encounter (Signed)
Received call from [place] (PT) with [facility] needing verbal orders for HHPT 2 Wk 3 and 1 Wk 5. The number to contact Sharyn Lull is [phone]

## 2021-09-01 NOTE — Telephone Encounter (Signed)
I called and gave Sharyn Lull verbal auth for HHPT

## 2021-09-01 NOTE — Telephone Encounter (Signed)
Patient called in stating she needs a new pain medication prescription she has not picked up the medication from the discharge facility she needs a rx for Oxycodone sent to [place], [place] states she is also having nausea and loss of appetite she would like someone to call her,  please advise

## 2021-09-04 ENCOUNTER — Encounter: Payer: Self-pay | Admitting: Specialist

## 2021-09-06 ENCOUNTER — Telehealth: Payer: Self-pay | Admitting: Specialist

## 2021-09-06 ENCOUNTER — Encounter: Payer: Self-pay | Admitting: Surgery

## 2021-09-06 ENCOUNTER — Encounter (HOSPITAL_COMMUNITY): Payer: Self-pay | Admitting: Emergency Medicine

## 2021-09-06 ENCOUNTER — Emergency Department (HOSPITAL_COMMUNITY): Payer: PPO

## 2021-09-06 ENCOUNTER — Other Ambulatory Visit: Payer: Self-pay

## 2021-09-06 ENCOUNTER — Ambulatory Visit: Payer: Self-pay

## 2021-09-06 ENCOUNTER — Ambulatory Visit (INDEPENDENT_AMBULATORY_CARE_PROVIDER_SITE_OTHER): Payer: PPO | Admitting: Surgery

## 2021-09-06 ENCOUNTER — Emergency Department (HOSPITAL_COMMUNITY)
Admission: EM | Admit: 2021-09-06 | Discharge: 2021-09-06 | Disposition: A | Discharge status: Home / Self Care | Payer: PPO | Attending: Student | Admitting: Student

## 2021-09-06 VITALS — BP 109/81 | HR 86

## 2021-09-06 DIAGNOSIS — K5903 Drug induced constipation: Secondary | ICD-10-CM | POA: Diagnosis not present

## 2021-09-06 DIAGNOSIS — Z87891 Personal history of nicotine dependence: Secondary | ICD-10-CM | POA: Insufficient documentation

## 2021-09-06 DIAGNOSIS — R109 Unspecified abdominal pain: Secondary | ICD-10-CM | POA: Diagnosis not present

## 2021-09-06 DIAGNOSIS — R1031 Right lower quadrant pain: Secondary | ICD-10-CM | POA: Insufficient documentation

## 2021-09-06 DIAGNOSIS — E039 Hypothyroidism, unspecified: Secondary | ICD-10-CM | POA: Diagnosis not present

## 2021-09-06 DIAGNOSIS — I10 Essential (primary) hypertension: Secondary | ICD-10-CM | POA: Diagnosis not present

## 2021-09-06 DIAGNOSIS — R1032 Left lower quadrant pain: Secondary | ICD-10-CM | POA: Diagnosis not present

## 2021-09-06 DIAGNOSIS — E119 Type 2 diabetes mellitus without complications: Secondary | ICD-10-CM | POA: Diagnosis not present

## 2021-09-06 DIAGNOSIS — Z981 Arthrodesis status: Secondary | ICD-10-CM

## 2021-09-06 LAB — CBC
HCT: 39.4 % (ref 36.0–46.0)
Hemoglobin: 12.9 g/dL (ref 12.0–15.0)
MCH: 31.1 pg (ref 26.0–34.0)
MCHC: 32.7 g/dL (ref 30.0–36.0)
MCV: 94.9 fL (ref 80.0–100.0)
Platelets: 365 10*3/uL (ref 150–400)
RBC: 4.15 MIL/uL (ref 3.87–5.11)
RDW: 14 % (ref 11.5–15.5)
WBC: 9.5 10*3/uL (ref 4.0–10.5)
nRBC: 0 % (ref 0.0–0.2)

## 2021-09-06 LAB — URINALYSIS, ROUTINE W REFLEX MICROSCOPIC
Bilirubin Urine: NEGATIVE
Glucose, UA: NEGATIVE mg/dL
Hgb urine dipstick: NEGATIVE
Ketones, ur: NEGATIVE mg/dL
Leukocytes,Ua: NEGATIVE
Nitrite: NEGATIVE
Protein, ur: NEGATIVE mg/dL
Specific Gravity, Urine: 1.005 (ref 1.005–1.030)
pH: 7 (ref 5.0–8.0)

## 2021-09-06 LAB — COMPREHENSIVE METABOLIC PANEL
ALT: 13 U/L (ref 0–44)
AST: 17 U/L (ref 15–41)
Albumin: 3.7 g/dL (ref 3.5–5.0)
Alkaline Phosphatase: 150 U/L — ABNORMAL HIGH (ref 38–126)
Anion gap: 9 (ref 5–15)
BUN: 10 mg/dL (ref 6–20)
CO2: 24 mmol/L (ref 22–32)
Calcium: 9.4 mg/dL (ref 8.9–10.3)
Chloride: 103 mmol/L (ref 98–111)
Creatinine, Ser: 0.95 mg/dL (ref 0.44–1.00)
GFR, Estimated: >60 mL/min (ref >60)
Glucose, Bld: 115 mg/dL — ABNORMAL HIGH (ref 70–99)
Potassium: 3.4 mmol/L — ABNORMAL LOW (ref 3.5–5.1)
Sodium: 136 mmol/L (ref 135–145)
Total Bilirubin: 0.9 mg/dL (ref 0.3–1.2)
Total Protein: 7.1 g/dL (ref 6.5–8.1)

## 2021-09-06 LAB — LIPASE, BLOOD: Lipase: 28 U/L (ref 11–51)

## 2021-09-06 MED ORDER — IOHEXOL 300 MG/ML  SOLN
100.0000 mL | Freq: Once | INTRAMUSCULAR | Status: AC | PRN
  Administered 2021-09-06: 100 mL via INTRAVENOUS

## 2021-09-06 MED ORDER — KETOROLAC TROMETHAMINE 15 MG/ML IJ SOLN
15.0000 mg | Freq: Once | INTRAMUSCULAR | Status: AC
  Administered 2021-09-06: 15 mg via INTRAVENOUS
  Filled 2021-09-06: qty 1

## 2021-09-06 MED ORDER — MORPHINE SULFATE (PF) 4 MG/ML IV SOLN
2.0000 mg | Freq: Once | INTRAVENOUS | Status: AC
  Administered 2021-09-06: 2 mg via INTRAVENOUS
  Filled 2021-09-06: qty 1

## 2021-09-06 MED ORDER — SORBITOL 70 % SOLN
960.0000 mL | TOPICAL_OIL | Freq: Once | ORAL | Status: AC
  Administered 2021-09-06: 960 mL via RECTAL
  Filled 2021-09-06: qty 473

## 2021-09-06 MED ORDER — LACTULOSE 10 G PO PACK: 10.0000 g | PACK | Freq: Three times a day (TID) | ORAL | 0 refills | Status: DC | PRN

## 2021-09-06 MED ORDER — LACTULOSE 10 GM/15ML PO SOLN
30.0000 g | Freq: Once | ORAL | Status: AC
  Administered 2021-09-06: 30 g via ORAL
  Filled 2021-09-06: qty 60

## 2021-09-06 MED ORDER — MORPHINE SULFATE (PF) 4 MG/ML IV SOLN
4.0000 mg | Freq: Once | INTRAVENOUS | Status: AC
  Administered 2021-09-06: 4 mg via INTRAVENOUS
  Filled 2021-09-06: qty 1

## 2021-09-06 NOTE — ED Triage Notes (Signed)
Pt presents with abdominal pain with constipation, recently had back surgery last month, had follow-up with surgery today and voice concerned and was told to come into ED for evaluation.

## 2021-09-06 NOTE — ED Provider Notes (Signed)
Pt signed out by Dr. Matilde Sprang pending symptomatic improvement after enema.  Pt did receive good results after soap suds and SMOG enema.  Pt is feeling much better and is hungry.  She is eating a microwave dinner now and is eating well.  Pt is stable for d/c.  She is to return if worse.  F/u with pcp.   Isla Pence, MD 09/06/21 1742

## 2021-09-06 NOTE — Telephone Encounter (Signed)
Called and spoke with Jeneen Rinks and he will have her on 09/14/21 @ 2pm

## 2021-09-06 NOTE — ED Notes (Addendum)
Pt requesting pain med prior to enema  EDP aware.

## 2021-09-06 NOTE — Telephone Encounter (Signed)
Pa Ricard Dillon see Nitka next week for post op wound check. Please call pt for appt next week with Nitka. Pt's husband Jeneen Rinks phone number is [phone].

## 2021-09-06 NOTE — ED Provider Notes (Signed)
[facility] EMERGENCY DEPARTMENT Provider Note  CSN: [id number] Arrival date & time: 09/06/21 1108  Chief Complaint(s) Abdominal Pain  HPI Debbie Pineda is a 61 y.o. female with PMH previous CVA, chronic low back pain with previous spinal stimulator implantation and subsequent removal after multilevel spinal spinal lumbar fusion 2 weeks ago who presents emergency department for evaluation of abdominal pain.  Patient had follow-up with her orthopedic PA this morning who cleared her from a postoperative standpoint but transferred her to the emergency department due to persistent abdominal pain and lack of a bowel movement.  Patient states she has not had a bowel movement in many days and has increased her opioid regimen after surgery.  She did recently restart iron and has had subsequent black stools.  She denies chest pain, shortness of breath, headache, fever, nausea, vomiting or other systemic symptoms.  Endorses bilateral lower quadrant abdominal pain.   Past Medical History Past Medical History:  Diagnosis Date   ADD (attention deficit disorder)    Arthritis    "all over"   Cerebral infarction ([place]) 10/30/2011   Cerebrovascular disease 08/14/2016   Chronic back pain    "all over"   Chronic low back pain 29/92/4268   Complication of anesthesia    tends to have hypotension when NPO and post-anesthesia   Constipation    takes stool softener daily   Degenerative disk disease    Degenerative joint disease    DVT (deep venous thrombosis) ([place]) 2014   RLE   Family history of adverse reaction to anesthesia    a family member woke up during surgery; "think it was my mom"   Fibromyalgia    Generalized osteoarthritis of multiple sites 11/04/2013   GERD (gastroesophageal reflux disease)    Headache    Heart palpitations 12/14/2013   resolved   History of blood clots    superficial   Hypoglycemia    Hypothyroid    takes Synthroid daily   Incomplete emptying of bladder     Insomnia    takes Trazodone nightly   Iron deficiency anemia    takes Ferrous Sulfate daily   Joint pain    Joint swelling    knees and ankles   Memory disorder 08/14/2016   Morbid obesity ([place])    Nausea    takes Zofran as needed.Seeing GI doc   Neck pain 05/25/2016   OSA on CPAP    tested more than 5 yrs ago.     Osteoarthritis    Osteopenia    in feet   PFO (patent foramen ovale)    no murmur   Pre-diabetes    Primary osteoarthritis of both feet 05/29/2016   Right bunionectomy August 2017 by Dr. Sharol Given   Scoliosis    Skin abnormality 02/04/2020   raised area on lip   Stroke [facility] "several"last 2014   right foot weakness; memory issues, black spot right visual field since" (03/23/2015)   Thrombophlebitis    Trochanteric bursitis of both hips 05/25/2016   Unilateral primary osteoarthritis, right knee 05/29/2016   Urinary urgency 04/19/2016   Vein disorder 11/29/2010   varicose veins both legs   Wears glasses    Patient Active Problem List   Diagnosis Date Noted   At risk for adverse drug event 08/31/2021   Acute on chronic blood loss anemia 08/28/2021   Constipation due to opioid therapy 08/28/2021   GERD without esophagitis 08/28/2021   Overactive bladder 08/28/2021   Chronic migraine without aura 08/28/2021  Hx of blood clots 08/28/2021   Major neurocognitive disorder ([place]) 08/28/2021   Status post lumbar spinal fusion 08/21/2021   Weight gain 07/13/2021   Encounter for removal of internal fixation device 10/21/2020   LFTs abnormal 03/02/2020   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   Gastroesophageal reflux disease 09/22/2019   SSBE (short-segment Barrett's esophagus) 09/22/2019   Flatulence 09/22/2019   Migraine 11/11/2018   Abdominal pain, epigastric 10/14/2018   LUQ pain 10/14/2018   Attention deficit hyperactivity disorder (ADHD) 01/12/2018   Insomnia 01/12/2018   Elevated liver enzymes 09/10/2017   History of diabetes  mellitus 06/06/2017   Primary osteoarthritis of right hand 06/06/2017   Transaminasemia    TIA (transient ischemic attack) 05/01/2017   Dysphasia 05/01/2017   Chronic pain syndrome 05/01/2017   Confusion    Presence of right artificial knee joint 09/17/2016   H/O total knee replacement, right 08/15/2016   Presence of retained hardware    Memory disorder 08/14/2016   Cerebrovascular disease 08/14/2016   Unilateral primary osteoarthritis, right knee 05/29/2016   Primary osteoarthritis of both feet 05/29/2016   Trochanteric bursitis of both hips 05/25/2016   Neck pain 05/25/2016   Urinary urgency 04/19/2016   Chronic low back pain 01/11/2016   Total knee replacement status 03/23/2015   Heart palpitations 12/14/2013   Generalized osteoarthritis of multiple sites 11/04/2013   Elevated LFTs 03/17/2012   Cerebral infarction ([place]) 10/30/2011   PFO (patent foramen ovale) 10/30/2011   Acquired hypothyroidism    Thrombophlebitis    OSA on CPAP    Fibromyalgia    Status post bariatric surgery 12/07/2010   Vein disorder 11/29/2010   S/P total hysterectomy and bilateral salpingo-oophorectomy 11/29/2010   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 Authorizing Provider  baclofen (LIORESAL) 10 MG tablet Take 1 tablet (10 mg total) by mouth 3 (three) times daily. 08/30/21  Yes Gerlene Fee, NP  Cholecalciferol (VITAMIN D3) 125 MCG (5000 UT) TABS Take 5,000 Units by mouth daily.   Yes [provider]  Cyanocobalamin (VITAMIN B-12) 5000 MCG SUBL Place 5,000 mcg under the tongue daily.    Yes [provider]  diclofenac sodium (VOLTAREN) 1 % GEL Apply 4 g topically 4 (four) times daily as needed (PAIN). 02/06/18  Yes Jessy Oto, MD  diphenhydrAMINE (BENADRYL) 50 MG tablet Take 1  tablet (50 mg total) by mouth every 8 (eight) hours as needed for itching. 08/26/21  Yes Jessy Oto, MD  docusate sodium (COLACE) 100 MG capsule Take 1 capsule (100 mg total) by mouth 2 (two) times daily. 08/26/21  Yes Jessy Oto, MD  donepezil (ARICEPT) 10 MG tablet Take 1 tablet (10 mg total) by mouth at bedtime. 08/30/21  Yes Gerlene Fee, NP  DULoxetine (CYMBALTA) 60 MG capsule Take 1 capsule (60 mg total) by mouth 2 (two) times daily. 08/30/21  Yes Gerlene Fee, NP  famotidine (PEPCID) 40 MG tablet Take 1 tablet (40 mg total) by mouth at bedtime. 08/30/21  Yes Gerlene Fee, NP  ferrous gluconate (FERGON) 324 MG tablet Take 1 tablet (324 mg total) by mouth 2 (two) times daily with a meal. 08/30/21  Yes Gerlene Fee, NP  folic acid (FOLVITE) 195 MCG tablet Take 400 mcg by mouth daily.   Yes  [provider]  Javier Docker Oil 350 MG CAPS Take 350 mg by mouth at bedtime.    Yes [provider]  levothyroxine (SYNTHROID) 125 MCG tablet Take 1 tablet (125 mcg total) by mouth daily before breakfast. 08/30/21  Yes Gerlene Fee, NP  lubiprostone (AMITIZA) 24 MCG capsule Take 1 capsule (24 mcg total) by mouth 2 (two) times daily with a meal. 08/30/21  Yes Gerlene Fee, NP  Methylfol-Methylcob-Acetylcyst (METAFOLBIC PLUS) 6-2-600 MG TABS Take 1 tablet by mouth daily. 08/30/21  Yes Gerlene Fee, NP  methylphenidate (CONCERTA) 27 MG PO CR tablet Take 1 tablet (27 mg total) by mouth 2 (two) times daily. 10 am & 1400 08/30/21  Yes Gerlene Fee, NP  miconazole (ZEASORB-AF) 2 % powder Every shift as needed. Apply to red areas in folds of skin.   Yes [provider]  mirabegron ER (MYRBETRIQ) 50 MG TB24 tablet Take 1 tablet (50 mg total) by mouth daily. 08/30/21  Yes Gerlene Fee, NP  Multiple Minerals-Vitamins (CAL-MAG-ZINC-D PO) Take 3 tablets by mouth daily. 333 mg-133 unit -133 mg-5 mg   Yes [provider]  Multiple Vitamins-Minerals (ONE-A-DAY WOMENS PETITES  PO) Take 1 tablet by mouth 2 (two) times daily.   Yes [provider]  NARCAN 4 MG/0.1ML LIQD nasal spray kit Place 0.1 sprays (0.4 mg total) into the nose 2 (two) times daily as needed. 08/30/21  Yes Gerlene Fee, NP  NUCYNTA ER 100 MG 12 hr tablet SMARTSIG:1 Tablet(s) By Mouth Every 12 Hours 08/05/21  Yes [provider]  omeprazole (PRILOSEC) 40 MG capsule Take one capsule by mouth daily before breaksfast. 08/30/21  Yes Gerlene Fee, NP  ondansetron (ZOFRAN-ODT) 4 MG disintegrating tablet Take 1 tablet (4 mg total) by mouth every 8 (eight) hours as needed for nausea or vomiting. 08/30/21  Yes Gerlene Fee, NP  oxyCODONE-acetaminophen (PERCOCET) 10-325 MG tablet Take 1 tablet by mouth every 6 (six) hours as needed for pain (post operative pain control due to thoracolumbar fusion surgery). 09/01/21  Yes Jessy Oto, MD  polyethylene glycol (MIRALAX / GLYCOLAX) 17 g packet Take 17 g by mouth daily as needed for mild constipation or moderate constipation.   Yes [provider]  Rimegepant Sulfate (NURTEC) 75 MG TBDP Take 75 mg by mouth daily as needed. 08/30/21  Yes Gerlene Fee, NP  rivaroxaban (XARELTO) 20 MG TABS tablet Take 1 tablet (20 mg total) by mouth daily at 6 PM. 08/30/21  Yes Green, Phylis Bougie, NP  sucralfate (CARAFATE) 1 g tablet Take 1 g by mouth 4 (four) times daily. 09/01/21  Yes [provider]  tiZANidine (ZANAFLEX) 4 MG tablet Take 1 tablet (4 mg total) by mouth every 6 (six) hours as needed for muscle spasms. 08/30/21  Yes Gerlene Fee, NP  topiramate (TOPAMAX) 100 MG tablet Take 1 tablet (100 mg total) by mouth 2 (two) times daily. 08/30/21  Yes Gerlene Fee, NP  traZODone (DESYREL) 100 MG tablet Take 0.5 tablets (50 mg total) by mouth at bedtime. 08/30/21  Yes Gerlene Fee, NP  trospium (SANCTURA) 20 MG tablet Take 1 tablet (20 mg total) by mouth 2 (two) times daily. 08/30/21  Yes Gerlene Fee, NP  vitamin C (ASCORBIC ACID) 500 MG  tablet Take 500 mg by mouth daily.   Yes [provider]  Galcanezumab-gnlm (EMGALITY) 120 MG/ML SOSY Inject 120 mg into the skin every 30 (thirty) days. Patient not taking:  Reported on 08/28/2021 04/17/21   Genia Harold, MD  ondansetron (ZOFRAN) 4 MG tablet Take 1 tablet (4 mg total) by mouth every 8 (eight) hours as needed for nausea or vomiting. Patient not taking: Reported on 09/06/2021 09/01/21   Jessy Oto, MD  oxyCODONE (OXYCONTIN) 20 mg 12 hr tablet Take 1 tablet (20 mg total) by mouth every 12 (twelve) hours. Patient not taking: Reported on 09/06/2021 08/30/21   Gerlene Fee, NP                                                                                                                                    Past Surgical History Past Surgical History:  Procedure Laterality Date   BIOPSY  11/14/2018   Procedure: BIOPSY;  Surgeon: Rogene Houston, MD;  [facility];  Service: Endoscopy;;  esophagusgastric   BONE EXCISION Right 08/29/2017   Procedure: right trapezium excision;  Surgeon: Daryll Brod, MD;  Location: [place];  Service: Orthopedics;  Laterality: Right;   BUNIONECTOMY Right 08/2015   CARDIAC CATHETERIZATION     2008.  "it was fine" (not sure why she had it done, and doesn't know where)   CARPOMETACARPEL SUSPENSION PLASTY Right 08/29/2017   Procedure: SUSPENSION PLASTY RIGHT THUMB;  Surgeon: Daryll Brod, MD;  Location: [place];  Service: Orthopedics;  Laterality: Right;   COLONOSCOPY N/A 03/25/2013   Procedure: COLONOSCOPY;  Surgeon: Rogene Houston, MD;  [facility];  Service: Endoscopy;  Laterality: N/A;  930   ERCP  06/02/2020   ESOPHAGOGASTRODUODENOSCOPY     ESOPHAGOGASTRODUODENOSCOPY (EGD) WITH PROPOFOL N/A 11/14/2018   Procedure: ESOPHAGOGASTRODUODENOSCOPY (EGD) WITH PROPOFOL;  Surgeon: Rogene Houston, MD;  [facility];  Service: Endoscopy;  Laterality: N/A;  1:55pm-office moved to  11:00am/pt notified to arrive at 9:30am per [place]     "took fallopian tubes out"   [place] Right 02/12/2020   Procedure: HALLUX INTERPHANGEAL JOINT  FUSION;  Surgeon: Felipa Furnace, DPM;  Location: [place];  Service: Podiatry;  Laterality: Right;   HAMMER TOE SURGERY Right 02/12/2020   Procedure: SECOND AND THIRD HAMMER TOE CORRECTION; CAPSULOTOMY SECOND INTERPHALANGEAL JOINT;  Surgeon: Felipa Furnace, DPM;  Location: [place];  Service: Podiatry;  Laterality: Right;   JOINT REPLACEMENT     bil knee    KNEE ARTHROSCOPY Left    KNEE ARTHROSCOPY W/ ACL RECONSTRUCTION Right yrs ago   "added pins"   LAPAROSCOPIC CHOLECYSTECTOMY  ~ 2001   ROUX-EN-Y GASTRIC BYPASS  11/20/2010   [place]   SPINAL CORD STIMULATOR INSERTION N/A 04/18/2017   Procedure: LUMBAR SPINAL CORD STIMULATOR INSERTION;  Surgeon: Clydell Hakim, MD;  Location: [place];  Service: Neurosurgery;  Laterality: N/A;  LUMBAR SPINAL CORD STIMULATOR INSERTION   stomach stent  04/28/2020   TENDON TRANSFER Right 08/29/2017   Procedure:  right abductor pollicis longus transfer;  Surgeon: Daryll Brod, MD;  Location: [place];  Service: Orthopedics;  Laterality: Right;   TOTAL KNEE ARTHROPLASTY Left 03/23/2015   Procedure: TOTAL KNEE ARTHROPLASTY;  Surgeon: Newt Minion, MD;  Location: [place];  Service: Orthopedics;  Laterality: Left;   TOTAL KNEE ARTHROPLASTY Right 08/15/2016   Procedure: RIGHT TOTAL KNEE ARTHROPLASTY, REMOVAL ACL SCREWS;  Surgeon: Newt Minion, MD;  Location: [place];  Service: Orthopedics;  Laterality: Right;   TOTAL KNEE ARTHROPLASTY WITH HARDWARE REMOVAL Right    VAGINAL HYSTERECTOMY     tah/bso   VARICOSE VEIN SURGERY Right X 2   WEIL OSTEOTOMY Right 02/12/2020   Procedure: DOUBLE L OSTEOTOMY;  Surgeon: Felipa Furnace, DPM;  Location: [place];  Service: Podiatry;  Laterality: Right;   Family History Family History  Problem  Relation Age of Onset   Cancer Mother        skin and breast   Myasthenia gravis Mother    Breast cancer Mother    Heart disease Father    Cancer Father    Parkinson's disease Father    Heart disease Sister    Heart attack Sister    Heart disease Brother    Cancer Brother    Diabetes Brother    Stroke Brother    Cancer Maternal Grandfather    Hypothyroidism Daughter    Hypertension Other    Colon cancer Neg Hx     Social History Social History   Tobacco Use   Smoking status: Former    Packs/day: 0.75    Years: 8.00    Total pack years: 6.00    Types: Cigarettes    Quit date: 12/01/1990    Years since quitting: 30.7    Passive exposure: Past   Smokeless tobacco: Never   Tobacco comments:    quit smoking in the 1990s  Vaping Use   Vaping Use: Never used  Substance Use Topics   Alcohol use: No    Comment: 03/23/2015 "stopped drinking in 2012 w/gastric bypass; drank socially before bypass"   Drug use: No   Allergies Lyrica [pregabalin], Belsomra [suvorexant], Morphine and related, Sulfamethoxazole-trimethoprim, and Tape  Review of Systems Review of Systems  Gastrointestinal:  Positive for abdominal pain and constipation.    Physical Exam Vital Signs  I have reviewed the triage vital signs BP 130/63   Pulse 80   Temp (!) 97.5 F (36.4 C) (Oral)   Resp 16   Ht '5\' 5"'  (1.651 m)   Wt 110.2 kg   SpO2 95%   BMI 40.44 kg/m   Physical Exam Vitals and nursing note reviewed.  Constitutional:      General: She is not in acute distress.    Appearance: She is well-developed.  HENT:     Head: Normocephalic and atraumatic.  Eyes:     Conjunctiva/sclera: Conjunctivae normal.  Cardiovascular:     Rate and Rhythm: Normal rate and regular rhythm.     Heart sounds: No murmur heard. Pulmonary:     Effort: Pulmonary effort is normal. No respiratory distress.     Breath sounds: Normal breath sounds.  Abdominal:     Palpations: Abdomen is soft.     Tenderness: There  is abdominal tenderness in the right lower quadrant and left lower quadrant.  Musculoskeletal:        General: No swelling.     Cervical back: Neck supple.  Skin:    General: Skin is  warm and dry.     Capillary Refill: Capillary refill takes less than 2 seconds.  Neurological:     Mental Status: She is alert.  Psychiatric:        Mood and Affect: Mood normal.     ED Results and Treatments Labs (all labs ordered are listed, but only abnormal results are displayed) Labs Reviewed  COMPREHENSIVE METABOLIC PANEL - Abnormal; Notable for the following components:      Result Value   Potassium 3.4 (*)    Glucose, Bld 115 (*)    Alkaline Phosphatase 150 (*)    All other components within normal limits  LIPASE, BLOOD  CBC  URINALYSIS, ROUTINE W REFLEX MICROSCOPIC                                                                                                                          Radiology No results found.  Pertinent labs & imaging results that were available during my care of the patient were reviewed by me and considered in my medical decision making (see MDM for details).  Medications Ordered in ED Medications  iohexol (OMNIPAQUE) 300 MG/ML solution 100 mL (100 mLs Intravenous Contrast Given 09/06/21 1302)                                                                                                                                     Procedures Procedures  (including critical care time)  Medical Decision Making / ED Course   This patient presents to the ED for concern of abdominal pain, constipation, this involves an extensive number of treatment options, and is a complaint that carries with it a high risk of complications and morbidity.  The differential diagnosis includes drug-induced constipation, UTI, diverticulitis, intra-abdominal infection, postop infection  MDM: Patient seen emergency department for evaluation of abdominal pain and constipation.  Physical exam  with bilateral lower quadrant abdominal tenderness that is mild to palpation as well as abdominal fullness.  Laboratory evaluation and urinalysis are unremarkable.  CT abdomen pelvis with prominent stool throughout the colon concerning for constipation.  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  With normal hepatic function panel, no right upper quadrant abdominal pain, I have low suspicion that the pneumobilia is of current clinical significance.  Mammograph the patient's constipation, we  trialed lactulose with no improvement in at time of signout, patient pending a soapsuds enema.  Suspect the patient will have good success with this soapsuds enema and she will be safe for discharge afterwards.  Patient then signed out to oncoming provider.  Please see provider signout for continuation of work-up.   Additional history obtained: -Additional history obtained from husband -External records from outside source obtained and reviewed including: Chart review including previous notes, labs, imaging, consultation notes   Lab Tests: -I ordered, reviewed, and interpreted labs.   The pertinent results include:   Labs Reviewed  COMPREHENSIVE METABOLIC PANEL - Abnormal; Notable for the following components:      Result Value   Potassium 3.4 (*)    Glucose, Bld 115 (*)    Alkaline Phosphatase 150 (*)    All other components within normal limits  LIPASE, BLOOD  CBC  URINALYSIS, ROUTINE W REFLEX MICROSCOPIC      EKG \  EKG Interpretation  Date/Time:  Wednesday September 06 2021 11:30:08 EDT Ventricular Rate:  99 PR Interval:  159 QRS Duration: 90 QT Interval:  339 QTC Calculation: 435 R Axis:   28 Text Interpretation: Sinus rhythm Probable left atrial enlargement Low voltage, precordial leads No significant change since last tracing Confirmed by Isla Pence  [phone]) on 09/06/2021 5:40:53 PM         Imaging Studies ordered: I ordered imaging studies including CT abdomen pelvis I independently visualized and interpreted imaging. I agree with the radiologist interpretation   Medicines ordered and prescription drug management: Meds ordered this encounter  Medications   iohexol (OMNIPAQUE) 300 MG/ML solution 100 mL    -I have reviewed the patients home medicines and have made adjustments as needed  Critical interventions none   Cardiac Monitoring: The patient was maintained on a cardiac monitor.  I personally viewed and interpreted the cardiac monitored which showed an underlying rhythm of: NSR  Social Determinants of Health:  Factors impacting patients care include: none   Reevaluation: After the interventions noted above, I reevaluated the patient and found that they have :improved  Co morbidities that complicate the patient evaluation  Past Medical History:  Diagnosis Date   ADD (attention deficit disorder)    Arthritis    "all over"   Cerebral infarction ([place]) 10/30/2011   Cerebrovascular disease 08/14/2016   Chronic back pain    "all over"   Chronic low back pain 67/61/9509   Complication of anesthesia    tends to have hypotension when NPO and post-anesthesia   Constipation    takes stool softener daily   Degenerative disk disease    Degenerative joint disease    DVT (deep venous thrombosis) ([place]) 2014   RLE   Family history of adverse reaction to anesthesia    a family member woke up during surgery; "think it was my mom"   Fibromyalgia    Generalized osteoarthritis of multiple sites 11/04/2013   GERD (gastroesophageal reflux disease)    Headache    Heart palpitations 12/14/2013   resolved   History of blood clots    superficial   Hypoglycemia    Hypothyroid    takes Synthroid daily   Incomplete emptying of bladder    Insomnia    takes Trazodone nightly   Iron deficiency anemia    takes Ferrous  Sulfate daily   Joint pain    Joint swelling    knees and ankles   Memory disorder 08/14/2016   Morbid obesity ([place])  Nausea    takes Zofran as needed.Seeing GI doc   Neck pain 05/25/2016   OSA on CPAP    tested more than 5 yrs ago.     Osteoarthritis    Osteopenia    in feet   PFO (patent foramen ovale)    no murmur   Pre-diabetes    Primary osteoarthritis of both feet 05/29/2016   Right bunionectomy August 2017 by Dr. Sharol Given   Scoliosis    Skin abnormality 02/04/2020   raised area on lip   Stroke [facility]) "several"last 2014   right foot weakness; memory issues, black spot right visual field since" (03/23/2015)   Thrombophlebitis    Trochanteric bursitis of both hips 05/25/2016   Unilateral primary osteoarthritis, right knee 05/29/2016   Urinary urgency 04/19/2016   Vein disorder 11/29/2010   varicose veins both legs   Wears glasses       Dispostion: I considered admission for this patient, and disposition will be pending patient's successful ability to have a bowel movement here in the emergency department.  Please see provider signout for continuation of work-up.     Final Clinical Impression(s) / ED Diagnoses Final diagnoses:  None     '@PCDICTATION' @    Sumiye Hirth, Debe Coder, MD 09/06/21 2200

## 2021-09-06 NOTE — Progress Notes (Signed)
Post-Op Visit Note   Patient: Debbie Pineda           Date of Birth: [birth year]           MRN: [id number] Visit Date: 09/06/2021 PCP: Jake Samples, PA-C   Assessment & Plan:  Chief Complaint:  Chief Complaint  Patient presents with   Lower Back - Routine Post Op  61 year old white female returns after having spinal cord stimulator removal multilevel instrumented fusion. 2 weeks ago.  Patient states that her preop leg pain is gone.  She is complaining of more incisional pain.  Overall doing very well. Visit Diagnoses:  1. S/P lumbar fusion     Plan: Advised patient I think she is doing very well at this point.  She will follow-up with Dr. Louanne Skye in 1 week for wound check and possible removal of remaining staples.  Continue brace.  Regards to her complaints of constipation I advised her to get in contact with her GI physician in [place] or if he gets that bad she needs to go to the emergency department.  She has already been taking stool softeners and has tried an enema.  Follow-Up Instructions: Return in about 1 week (around 09/13/2021) for WITH DR [place].   Orders:  Orders Placed This Encounter  Procedures   XR THORACOLUMABAR SPINE   No orders of the defined types were placed in this encounter.   Imaging: No results found.  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   Gastroesophageal reflux disease 09/22/2019   SSBE (short-segment Barrett's esophagus) 09/22/2019   Flatulence 09/22/2019   Migraine 11/11/2018   Abdominal pain, epigastric 10/14/2018   LUQ pain 10/14/2018   Attention deficit hyperactivity disorder (ADHD) 01/12/2018   Insomnia 01/12/2018   Elevated liver enzymes 09/10/2017   History of diabetes mellitus 06/06/2017   Primary osteoarthritis of right hand 06/06/2017   Transaminasemia    TIA (transient ischemic attack) 05/01/2017   Dysphasia 05/01/2017   Chronic pain syndrome 05/01/2017   Confusion    Presence of right artificial knee joint 09/17/2016   H/O total knee replacement, right 08/15/2016   Presence of retained hardware    Memory disorder 08/14/2016   Cerebrovascular disease 08/14/2016   Unilateral primary osteoarthritis, right knee 05/29/2016   Primary osteoarthritis of both feet 05/29/2016   Trochanteric bursitis of both hips 05/25/2016   Neck pain 05/25/2016   Urinary urgency 04/19/2016   Chronic low back pain 01/11/2016   Total knee replacement status 03/23/2015   Heart palpitations 12/14/2013   Generalized osteoarthritis of multiple sites 11/04/2013   Elevated LFTs 03/17/2012   Cerebral infarction ([place]) 10/30/2011   PFO (patent foramen ovale) 10/30/2011   Acquired hypothyroidism    Thrombophlebitis    OSA on CPAP    Fibromyalgia    Status post bariatric surgery 12/07/2010   Vein disorder 11/29/2010   S/P total hysterectomy and bilateral  salpingo-oophorectomy 11/29/2010   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   S/P cholecystectomy 11/29/2010   S/P ACL surgery 11/29/2010   Morbid obesity ([place]) 11/10/2010   Past Medical History:  Diagnosis Date   ADD (attention deficit disorder)    Arthritis    "all over"   Cerebral infarction ([place]) 10/30/2011   Cerebrovascular disease 08/14/2016   Chronic back pain    "all over"   Chronic low back pain 29/79/8921   Complication of  anesthesia    tends to have hypotension when NPO and post-anesthesia   Constipation    takes stool softener daily   Degenerative disk disease    Degenerative joint disease    DVT (deep venous thrombosis) ([place]) 2014   RLE   Family history of adverse reaction to anesthesia    a family member woke up during surgery; "think it was my mom"   Fibromyalgia    Generalized osteoarthritis of multiple sites 11/04/2013   GERD (gastroesophageal reflux disease)    Headache    Heart palpitations 12/14/2013   resolved   History of blood clots    superficial   Hypoglycemia    Hypothyroid    takes Synthroid daily   Incomplete emptying of bladder    Insomnia    takes Trazodone nightly   Iron deficiency anemia    takes Ferrous Sulfate daily   Joint pain    Joint swelling    knees and ankles   Memory disorder 08/14/2016   Morbid obesity ([place])    Nausea    takes Zofran as needed.Seeing GI doc   Neck pain 05/25/2016   OSA on CPAP    tested more than 5 yrs ago.     Osteoarthritis    Osteopenia    in feet   PFO (patent foramen ovale)    no murmur   Pre-diabetes    Primary osteoarthritis of both feet 05/29/2016   Right bunionectomy August 2017 by Dr. Sharol Given   Scoliosis    Skin abnormality 02/04/2020   raised area on lip   Stroke [facility]) "several"last 2014   right foot weakness; memory issues, black spot right visual field since" (03/23/2015)   Thrombophlebitis    Trochanteric bursitis of both hips 05/25/2016   Unilateral primary osteoarthritis, right knee 05/29/2016   Urinary urgency 04/19/2016   Vein disorder 11/29/2010   varicose veins both legs   Wears glasses     Family History  Problem Relation Age of Onset   Cancer Mother        skin and breast   Myasthenia gravis Mother    Breast cancer Mother    Heart disease Father    Cancer Father    Parkinson's disease Father    Heart disease Sister    Heart attack Sister    Heart disease Brother    Cancer Brother    Diabetes Brother     Stroke Brother    Cancer Maternal Grandfather    Hypothyroidism Daughter    Hypertension Other    Colon cancer Neg Hx     Past Surgical History:  Procedure Laterality Date   BIOPSY  11/14/2018   Procedure: BIOPSY;  Surgeon: Rogene Houston, MD;  [facility];  Service: Endoscopy;;  esophagusgastric   BONE EXCISION Right 08/29/2017   Procedure: right trapezium excision;  Surgeon: Daryll Brod, MD;  Location: [place];  Service: Orthopedics;  Laterality: Right;   BUNIONECTOMY Right 08/2015   CARDIAC CATHETERIZATION  2008.  "it was fine" (not sure why she had it done, and doesn't know where)   CARPOMETACARPEL SUSPENSION PLASTY Right 08/29/2017   Procedure: SUSPENSION PLASTY RIGHT THUMB;  Surgeon: Daryll Brod, MD;  Location: [place];  Service: Orthopedics;  Laterality: Right;   COLONOSCOPY N/A 03/25/2013   Procedure: COLONOSCOPY;  Surgeon: Rogene Houston, MD;  [facility];  Service: Endoscopy;  Laterality: N/A;  930   ERCP  06/02/2020   ESOPHAGOGASTRODUODENOSCOPY     ESOPHAGOGASTRODUODENOSCOPY (EGD) WITH PROPOFOL N/A 11/14/2018   Procedure: ESOPHAGOGASTRODUODENOSCOPY (EGD) WITH PROPOFOL;  Surgeon: Rogene Houston, MD;  [facility];  Service: Endoscopy;  Laterality: N/A;  1:55pm-office moved to 11:00am/pt notified to arrive at 9:30am per [place]     "took fallopian tubes out"   [place] Right 02/12/2020   Procedure: HALLUX INTERPHANGEAL JOINT  FUSION;  Surgeon: Felipa Furnace, DPM;  Location: [place];  Service: Podiatry;  Laterality: Right;   HAMMER TOE SURGERY Right 02/12/2020   Procedure: SECOND AND THIRD HAMMER TOE CORRECTION; CAPSULOTOMY SECOND INTERPHALANGEAL JOINT;  Surgeon: Felipa Furnace, DPM;  Location: [place];  Service: Podiatry;  Laterality: Right;   JOINT REPLACEMENT     bil knee    KNEE ARTHROSCOPY Left    KNEE ARTHROSCOPY W/ ACL RECONSTRUCTION  Right yrs ago   "added pins"   LAPAROSCOPIC CHOLECYSTECTOMY  ~ 2001   ROUX-EN-Y GASTRIC BYPASS  11/20/2010   [place]   SPINAL CORD STIMULATOR INSERTION N/A 04/18/2017   Procedure: LUMBAR SPINAL CORD STIMULATOR INSERTION;  Surgeon: Clydell Hakim, MD;  Location: [place];  Service: Neurosurgery;  Laterality: N/A;  LUMBAR SPINAL CORD STIMULATOR INSERTION   stomach stent  04/28/2020   TENDON TRANSFER Right 08/29/2017   Procedure: right abductor pollicis longus transfer;  Surgeon: Daryll Brod, MD;  Location: [place];  Service: Orthopedics;  Laterality: Right;   TOTAL KNEE ARTHROPLASTY Left 03/23/2015   Procedure: TOTAL KNEE ARTHROPLASTY;  Surgeon: Newt Minion, MD;  Location: [place];  Service: Orthopedics;  Laterality: Left;   TOTAL KNEE ARTHROPLASTY Right 08/15/2016   Procedure: RIGHT TOTAL KNEE ARTHROPLASTY, REMOVAL ACL SCREWS;  Surgeon: Newt Minion, MD;  Location: [place];  Service: Orthopedics;  Laterality: Right;   TOTAL KNEE ARTHROPLASTY WITH HARDWARE REMOVAL Right    VAGINAL HYSTERECTOMY     tah/bso   VARICOSE VEIN SURGERY Right X 2   WEIL OSTEOTOMY Right 02/12/2020   Procedure: DOUBLE L OSTEOTOMY;  Surgeon: Felipa Furnace, DPM;  Location: [place];  Service: Podiatry;  Laterality: Right;   Social History   Occupational History   Occupation: Disability   Occupation: formerly Therapist, sports, [employer]  Tobacco Use   Smoking status: Former    Packs/day: 0.75    Years: 8.00    Total pack years: 6.00    Types: Cigarettes    Quit date: 12/01/1990    Years since quitting: 30.7    Passive exposure: Past   Smokeless tobacco: Never   Tobacco comments:    quit smoking in the 1990s  Vaping Use   Vaping Use: Never used  Substance and Sexual Activity   Alcohol use: No    Comment: 03/23/2015 "stopped drinking in 2012 w/gastric bypass; drank socially before bypass"   Drug use: No   Sexual activity: Not Currently    Birth control/protection: Surgical    Exam Surgical  stages are healing well.  Every other staple was removed from the longer midline incision Steri-Strips were applied.  All the staples were removed from spinal cord battery incision and also Steri-Strips were applied there as well..  No drainage or signs infection.

## 2021-09-07 ENCOUNTER — Other Ambulatory Visit: Payer: Self-pay | Admitting: Specialist

## 2021-09-07 DIAGNOSIS — K5903 Drug induced constipation: Secondary | ICD-10-CM | POA: Diagnosis not present

## 2021-09-07 DIAGNOSIS — G43709 Chronic migraine without aura, not intractable, without status migrainosus: Secondary | ICD-10-CM | POA: Diagnosis not present

## 2021-09-07 DIAGNOSIS — M419 Scoliosis, unspecified: Secondary | ICD-10-CM | POA: Diagnosis not present

## 2021-09-07 DIAGNOSIS — N3281 Overactive bladder: Secondary | ICD-10-CM | POA: Diagnosis not present

## 2021-09-07 DIAGNOSIS — G4733 Obstructive sleep apnea (adult) (pediatric): Secondary | ICD-10-CM | POA: Diagnosis not present

## 2021-09-07 DIAGNOSIS — M797 Fibromyalgia: Secondary | ICD-10-CM | POA: Diagnosis not present

## 2021-09-07 DIAGNOSIS — Z96653 Presence of artificial knee joint, bilateral: Secondary | ICD-10-CM | POA: Diagnosis not present

## 2021-09-07 DIAGNOSIS — F909 Attention-deficit hyperactivity disorder, unspecified type: Secondary | ICD-10-CM | POA: Diagnosis not present

## 2021-09-07 DIAGNOSIS — F5101 Primary insomnia: Secondary | ICD-10-CM | POA: Diagnosis not present

## 2021-09-07 DIAGNOSIS — E039 Hypothyroidism, unspecified: Secondary | ICD-10-CM | POA: Diagnosis not present

## 2021-09-07 DIAGNOSIS — Z4789 Encounter for other orthopedic aftercare: Secondary | ICD-10-CM | POA: Diagnosis not present

## 2021-09-07 DIAGNOSIS — D62 Acute posthemorrhagic anemia: Secondary | ICD-10-CM | POA: Diagnosis not present

## 2021-09-07 DIAGNOSIS — R7303 Prediabetes: Secondary | ICD-10-CM | POA: Diagnosis not present

## 2021-09-07 DIAGNOSIS — M519 Unspecified thoracic, thoracolumbar and lumbosacral intervertebral disc disorder: Secondary | ICD-10-CM | POA: Diagnosis not present

## 2021-09-07 DIAGNOSIS — K219 Gastro-esophageal reflux disease without esophagitis: Secondary | ICD-10-CM | POA: Diagnosis not present

## 2021-09-07 DIAGNOSIS — G894 Chronic pain syndrome: Secondary | ICD-10-CM | POA: Diagnosis not present

## 2021-09-07 DIAGNOSIS — Z7901 Long term (current) use of anticoagulants: Secondary | ICD-10-CM | POA: Diagnosis not present

## 2021-09-07 DIAGNOSIS — Z8673 Personal history of transient ischemic attack (TIA), and cerebral infarction without residual deficits: Secondary | ICD-10-CM | POA: Diagnosis not present

## 2021-09-07 DIAGNOSIS — Z90710 Acquired absence of both cervix and uterus: Secondary | ICD-10-CM | POA: Diagnosis not present

## 2021-09-07 DIAGNOSIS — Z86711 Personal history of pulmonary embolism: Secondary | ICD-10-CM | POA: Diagnosis not present

## 2021-09-07 DIAGNOSIS — M159 Polyosteoarthritis, unspecified: Secondary | ICD-10-CM | POA: Diagnosis not present

## 2021-09-07 DIAGNOSIS — M8580 Other specified disorders of bone density and structure, unspecified site: Secondary | ICD-10-CM | POA: Diagnosis not present

## 2021-09-07 DIAGNOSIS — Z981 Arthrodesis status: Secondary | ICD-10-CM | POA: Diagnosis not present

## 2021-09-07 NOTE — Telephone Encounter (Signed)
Sent refill request to [facility]

## 2021-09-07 NOTE — Telephone Encounter (Signed)
Patient has been seen since not was written

## 2021-09-07 NOTE — Telephone Encounter (Signed)
Pt states she doesn't have an appt with pain management until 8/24 and need pain medication to hold her until then. Pt is asking for a call back from [place]. Pt phone number is [phone]

## 2021-09-08 ENCOUNTER — Other Ambulatory Visit: Payer: Self-pay | Admitting: Surgery

## 2021-09-08 MED ORDER — OXYCODONE-ACETAMINOPHEN 10-325 MG PO TABS: 1.0000 | ORAL_TABLET | Freq: Four times a day (QID) | ORAL | 0 refills | Status: DC | PRN

## 2021-09-08 NOTE — Telephone Encounter (Signed)
I called patient to advise pain medication has been sent in. She wanted me to relay to Dr. [name], and the on call doctor that she had to go to the ED due to constipation. She was given lactulose and was told if this does not work, she should pick up mag citrate from the pharmacy. She has not had any relief yet. She is also very concerned about a new finding on CT that she wanted everyone to be aware of in case it is a major finding.  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  I advise Jeneen Rinks and Dr. Louanne Skye are out of the office until next week, but that I would get them the information.

## 2021-09-11 DIAGNOSIS — Z8673 Personal history of transient ischemic attack (TIA), and cerebral infarction without residual deficits: Secondary | ICD-10-CM | POA: Diagnosis not present

## 2021-09-11 DIAGNOSIS — Z86711 Personal history of pulmonary embolism: Secondary | ICD-10-CM | POA: Diagnosis not present

## 2021-09-11 DIAGNOSIS — K219 Gastro-esophageal reflux disease without esophagitis: Secondary | ICD-10-CM | POA: Diagnosis not present

## 2021-09-11 DIAGNOSIS — K5903 Drug induced constipation: Secondary | ICD-10-CM | POA: Diagnosis not present

## 2021-09-11 DIAGNOSIS — G894 Chronic pain syndrome: Secondary | ICD-10-CM | POA: Diagnosis not present

## 2021-09-11 DIAGNOSIS — Z7901 Long term (current) use of anticoagulants: Secondary | ICD-10-CM | POA: Diagnosis not present

## 2021-09-11 DIAGNOSIS — N3281 Overactive bladder: Secondary | ICD-10-CM | POA: Diagnosis not present

## 2021-09-11 DIAGNOSIS — M419 Scoliosis, unspecified: Secondary | ICD-10-CM | POA: Diagnosis not present

## 2021-09-11 DIAGNOSIS — M797 Fibromyalgia: Secondary | ICD-10-CM | POA: Diagnosis not present

## 2021-09-11 DIAGNOSIS — Z90710 Acquired absence of both cervix and uterus: Secondary | ICD-10-CM | POA: Diagnosis not present

## 2021-09-11 DIAGNOSIS — Z4789 Encounter for other orthopedic aftercare: Secondary | ICD-10-CM | POA: Diagnosis not present

## 2021-09-11 DIAGNOSIS — Z96653 Presence of artificial knee joint, bilateral: Secondary | ICD-10-CM | POA: Diagnosis not present

## 2021-09-11 DIAGNOSIS — M159 Polyosteoarthritis, unspecified: Secondary | ICD-10-CM | POA: Diagnosis not present

## 2021-09-11 DIAGNOSIS — Z981 Arthrodesis status: Secondary | ICD-10-CM | POA: Diagnosis not present

## 2021-09-11 DIAGNOSIS — D62 Acute posthemorrhagic anemia: Secondary | ICD-10-CM | POA: Diagnosis not present

## 2021-09-11 DIAGNOSIS — G4733 Obstructive sleep apnea (adult) (pediatric): Secondary | ICD-10-CM | POA: Diagnosis not present

## 2021-09-11 DIAGNOSIS — M8580 Other specified disorders of bone density and structure, unspecified site: Secondary | ICD-10-CM | POA: Diagnosis not present

## 2021-09-11 DIAGNOSIS — G43709 Chronic migraine without aura, not intractable, without status migrainosus: Secondary | ICD-10-CM | POA: Diagnosis not present

## 2021-09-11 DIAGNOSIS — E039 Hypothyroidism, unspecified: Secondary | ICD-10-CM | POA: Diagnosis not present

## 2021-09-11 DIAGNOSIS — F5101 Primary insomnia: Secondary | ICD-10-CM | POA: Diagnosis not present

## 2021-09-11 DIAGNOSIS — M519 Unspecified thoracic, thoracolumbar and lumbosacral intervertebral disc disorder: Secondary | ICD-10-CM | POA: Diagnosis not present

## 2021-09-11 DIAGNOSIS — F909 Attention-deficit hyperactivity disorder, unspecified type: Secondary | ICD-10-CM | POA: Diagnosis not present

## 2021-09-11 DIAGNOSIS — R7303 Prediabetes: Secondary | ICD-10-CM | POA: Diagnosis not present

## 2021-09-12 ENCOUNTER — Telehealth: Payer: Self-pay | Admitting: Specialist

## 2021-09-12 NOTE — Telephone Encounter (Signed)
Debbie Pineda with [facility] called request orders for OT 1x 7w  CB [phone]

## 2021-09-12 NOTE — Telephone Encounter (Signed)
Mallory from [facility] is who called, I called her back and gave verbal [place]

## 2021-09-14 ENCOUNTER — Telehealth: Payer: Self-pay | Admitting: Specialist

## 2021-09-14 ENCOUNTER — Encounter: Payer: Self-pay | Admitting: Specialist

## 2021-09-14 ENCOUNTER — Ambulatory Visit (INDEPENDENT_AMBULATORY_CARE_PROVIDER_SITE_OTHER): Payer: PPO | Admitting: Specialist

## 2021-09-14 VITALS — BP 125/85 | HR 93 | Ht 64.0 in | Wt 258.0 lb

## 2021-09-14 DIAGNOSIS — T402X5A Adverse effect of other opioids, initial encounter: Secondary | ICD-10-CM

## 2021-09-14 DIAGNOSIS — M5416 Radiculopathy, lumbar region: Secondary | ICD-10-CM | POA: Diagnosis not present

## 2021-09-14 DIAGNOSIS — M47816 Spondylosis without myelopathy or radiculopathy, lumbar region: Secondary | ICD-10-CM | POA: Diagnosis not present

## 2021-09-14 DIAGNOSIS — K5903 Drug induced constipation: Secondary | ICD-10-CM

## 2021-09-14 DIAGNOSIS — G894 Chronic pain syndrome: Secondary | ICD-10-CM | POA: Diagnosis not present

## 2021-09-14 DIAGNOSIS — M79671 Pain in right foot: Secondary | ICD-10-CM | POA: Diagnosis not present

## 2021-09-14 MED ORDER — LINACLOTIDE 290 MCG PO CAPS: 290.0000 ug | ORAL_CAPSULE | Freq: Every day | ORAL | Status: AC

## 2021-09-14 MED ORDER — METOCLOPRAMIDE HCL 5 MG PO TABS: 5.0000 mg | ORAL_TABLET | Freq: Three times a day (TID) | ORAL | 2 refills | Status: DC | PRN

## 2021-09-14 NOTE — Telephone Encounter (Signed)
Pt was seen in office today and was told to follow up in 2 weeks. Wasn't able to get pt in until 9/13. Can they be worked in?   Cb [phone]

## 2021-09-14 NOTE — Progress Notes (Signed)
Post-Op Visit Note   Patient: Debbie Pineda           Date of Birth: 1960-06-04           MRN: [id number] Visit Date: 09/14/2021 PCP: Jake Samples, PA-C   Assessment & Plan:3.5 weeks post op T10 to S1 fusion with rods and screws and 4 level  TLIF.   Chief Complaint:  Chief Complaint  Patient presents with   Middle Back - Pain, Follow-up  61 year old female post long T-L fusion T10 to S1 She has been having problems with constipation. Using a recliner and walking in the house in the driveway, once time with PT, hot outside.  Not taking linzess due to previous history of nausea. Presently taking oxycodone, baclofen and tizanidine. No longer taking oxycontin ER 12 hour form. Restarted nucyenta after leaving the SN[facility] in [place]. Motor is intact Complains of pain on the right side at the L-S junction.  SLR negative Visit Diagnoses: No diagnosis found.  Plan: Avoid frequent bending and stooping  No lifting greater than 5-10 lbs. May use ice or moist heat for pain. Weight loss is of benefit. The least amount of narcotics the better you can expect bowel function to return to normal Recommend decreasing your meds by interval taking '10mg'$  oxycodone every 5 hours and try to increase the Time between doses. Nucyenta only increases the amount of narcotic, it is not improving bowel function due to the presence of oxycodone.  Will start Linzess to try and decrease opioid effect on the bowels causing decreased motility. Drink 5 large glasses of water through out the day. Start reglan 5 mg po TID.   Exercise is important to improve your indurance and does allow people to function better inspite of back pain.    Follow-Up Instructions: No follow-ups on file.   Orders:  No orders of the defined types were placed in this encounter.  No orders of the defined types were placed in this encounter.   Imaging: No results found.  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[place]) 11/18/2019   Gastroesophageal reflux disease 09/22/2019   SSBE (short-segment Barrett's esophagus) 09/22/2019   Flatulence 09/22/2019   Migraine 11/11/2018   Abdominal pain, epigastric 10/14/2018   LUQ pain 10/14/2018   Attention deficit hyperactivity disorder (ADHD) 01/12/2018   Insomnia 01/12/2018   Elevated liver enzymes 09/10/2017   History of diabetes mellitus 06/06/2017   Primary osteoarthritis of right hand 06/06/2017   Transaminasemia    TIA (transient ischemic attack) 05/01/2017   Dysphasia 05/01/2017   Chronic pain syndrome 05/01/2017   Confusion    Presence of right artificial knee joint 09/17/2016   H/O total knee replacement, right 08/15/2016   Presence of retained hardware    Memory disorder 08/14/2016   Cerebrovascular disease 08/14/2016   Unilateral primary osteoarthritis, right knee 05/29/2016   Primary osteoarthritis of both feet 05/29/2016  Trochanteric bursitis of both hips 05/25/2016   Neck pain 05/25/2016   Urinary urgency 04/19/2016   Chronic low back pain 01/11/2016   Total knee replacement status 03/23/2015   Heart palpitations 12/14/2013   Generalized osteoarthritis of multiple sites 11/04/2013   Elevated LFTs 03/17/2012   Cerebral infarction ([place]) 10/30/2011   PFO (patent foramen ovale) 10/30/2011   Acquired hypothyroidism    Thrombophlebitis    OSA on CPAP     Fibromyalgia    Status post bariatric surgery 12/07/2010   Vein disorder 11/29/2010   S/P total hysterectomy and bilateral salpingo-oophorectomy 11/29/2010   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   S/P cholecystectomy 11/29/2010   S/P ACL surgery 11/29/2010   Morbid obesity ([place]) 11/10/2010   Past Medical History:  Diagnosis Date   ADD (attention deficit disorder)    Arthritis    "all over"   Cerebral infarction ([place]) 10/30/2011   Cerebrovascular disease 08/14/2016   Chronic back pain    "all over"   Chronic low back pain 66/29/4765   Complication of anesthesia    tends to have hypotension when NPO and post-anesthesia   Constipation    takes stool softener daily   Degenerative disk disease    Degenerative joint disease    DVT (deep venous thrombosis) ([place]) 2014   RLE   Family history of adverse reaction to anesthesia    a family member woke up during surgery; "think it was my mom"   Fibromyalgia    Generalized osteoarthritis of multiple sites 11/04/2013   GERD (gastroesophageal reflux disease)    Headache    Heart palpitations 12/14/2013   resolved   History of blood clots    superficial   Hypoglycemia    Hypothyroid    takes Synthroid daily   Incomplete emptying of bladder    Insomnia    takes Trazodone nightly   Iron deficiency anemia    takes Ferrous Sulfate daily   Joint pain    Joint swelling    knees and ankles   Memory disorder 08/14/2016   Morbid obesity ([place])    Nausea    takes Zofran as needed.Seeing GI doc   Neck pain 05/25/2016   OSA on CPAP    tested more than 5 yrs ago.     Osteoarthritis    Osteopenia    in feet   PFO (patent foramen ovale)    no murmur   Pre-diabetes    Primary osteoarthritis of both feet 05/29/2016   Right bunionectomy August 2017 by Dr. Sharol Given   Scoliosis    Skin abnormality 02/04/2020   raised area on lip   Stroke [facility]) "several"last 2014   right foot weakness; memory issues, black spot right visual field  since" (03/23/2015)   Thrombophlebitis    Trochanteric bursitis of both hips 05/25/2016   Unilateral primary osteoarthritis, right knee 05/29/2016   Urinary urgency 04/19/2016   Vein disorder 11/29/2010   varicose veins both legs   Wears glasses     Family History  Problem Relation Age of Onset   Cancer Mother        skin and breast   Myasthenia gravis Mother    Breast cancer Mother    Heart disease Father    Cancer Father    Parkinson's disease Father    Heart disease Sister    Heart attack Sister    Heart disease Brother    Cancer Brother    Diabetes Brother    Stroke Brother  Cancer Maternal Grandfather    Hypothyroidism Daughter    Hypertension Other    Colon cancer Neg Hx     Past Surgical History:  Procedure Laterality Date   BIOPSY  11/14/2018   Procedure: BIOPSY;  Surgeon: Rogene Houston, MD;  [facility];  Service: Endoscopy;;  esophagusgastric   BONE EXCISION Right 08/29/2017   Procedure: right trapezium excision;  Surgeon: Daryll Brod, MD;  Location: [place];  Service: Orthopedics;  Laterality: Right;   BUNIONECTOMY Right 08/2015   CARDIAC CATHETERIZATION     2008.  "it was fine" (not sure why she had it done, and doesn't know where)   CARPOMETACARPEL SUSPENSION PLASTY Right 08/29/2017   Procedure: SUSPENSION PLASTY RIGHT THUMB;  Surgeon: Daryll Brod, MD;  Location: [place];  Service: Orthopedics;  Laterality: Right;   COLONOSCOPY N/A 03/25/2013   Procedure: COLONOSCOPY;  Surgeon: Rogene Houston, MD;  [facility];  Service: Endoscopy;  Laterality: N/A;  930   ERCP  06/02/2020   ESOPHAGOGASTRODUODENOSCOPY     ESOPHAGOGASTRODUODENOSCOPY (EGD) WITH PROPOFOL N/A 11/14/2018   Procedure: ESOPHAGOGASTRODUODENOSCOPY (EGD) WITH PROPOFOL;  Surgeon: Rogene Houston, MD;  [facility];  Service: Endoscopy;  Laterality: N/A;  1:55pm-office moved to 11:00am/pt notified to arrive at 9:30am per [place]     "took fallopian tubes out"   [place] Right 02/12/2020   Procedure: HALLUX INTERPHANGEAL JOINT  FUSION;  Surgeon: Felipa Furnace, DPM;  Location: [place];  Service: Podiatry;  Laterality: Right;   HAMMER TOE SURGERY Right 02/12/2020   Procedure: SECOND AND THIRD HAMMER TOE CORRECTION; CAPSULOTOMY SECOND INTERPHALANGEAL JOINT;  Surgeon: Felipa Furnace, DPM;  Location: [place];  Service: Podiatry;  Laterality: Right;   JOINT REPLACEMENT     bil knee    KNEE ARTHROSCOPY Left    KNEE ARTHROSCOPY W/ ACL RECONSTRUCTION Right yrs ago   "added pins"   LAPAROSCOPIC CHOLECYSTECTOMY  ~ 2001   ROUX-EN-Y GASTRIC BYPASS  11/20/2010   [place]   SPINAL CORD STIMULATOR INSERTION N/A 04/18/2017   Procedure: LUMBAR SPINAL CORD STIMULATOR INSERTION;  Surgeon: Clydell Hakim, MD;  Location: [place];  Service: Neurosurgery;  Laterality: N/A;  LUMBAR SPINAL CORD STIMULATOR INSERTION   stomach stent  04/28/2020   TENDON TRANSFER Right 08/29/2017   Procedure: right abductor pollicis longus transfer;  Surgeon: Daryll Brod, MD;  Location: [place];  Service: Orthopedics;  Laterality: Right;   TOTAL KNEE ARTHROPLASTY Left 03/23/2015   Procedure: TOTAL KNEE ARTHROPLASTY;  Surgeon: Newt Minion, MD;  Location: [place];  Service: Orthopedics;  Laterality: Left;   TOTAL KNEE ARTHROPLASTY Right 08/15/2016   Procedure: RIGHT TOTAL KNEE ARTHROPLASTY, REMOVAL ACL SCREWS;  Surgeon: Newt Minion, MD;  Location: [place];  Service: Orthopedics;  Laterality: Right;   TOTAL KNEE ARTHROPLASTY WITH HARDWARE REMOVAL Right    VAGINAL HYSTERECTOMY     tah/bso   VARICOSE VEIN SURGERY Right X 2   WEIL OSTEOTOMY Right 02/12/2020   Procedure: DOUBLE L OSTEOTOMY;  Surgeon: Felipa Furnace, DPM;  Location: [place];  Service: Podiatry;  Laterality: Right;   Social History   Occupational History   Occupation: Disability   Occupation: formerly  Therapist, sports, [employer]  Tobacco Use   Smoking status: Former    Packs/day: 0.75    Years: 8.00    Total pack years: 6.00    Types:  Cigarettes    Quit date: 12/01/1990    Years since quitting: 30.8    Passive exposure: Past   Smokeless tobacco: Never   Tobacco comments:    quit smoking in the 1990s  Vaping Use   Vaping Use: Never used  Substance and Sexual Activity   Alcohol use: No    Comment: 03/23/2015 "stopped drinking in 2012 w/gastric bypass; drank socially before bypass"   Drug use: No   Sexual activity: Not Currently    Birth control/protection: Surgical

## 2021-09-14 NOTE — Patient Instructions (Signed)
Plan: Avoid frequent bending and stooping  No lifting greater than 5-10 lbs. May use ice or moist heat for pain. Weight loss is of benefit. The least amount of narcotics the better you can expect bowel function to return to normal Recommend decreasing your meds by interval taking '10mg'$  oxycodone every 5 hours and try to increase the Time between doses. Nucyenta only increases the amount of narcotic, it is not improving bowel function due to the presence of oxycodone.  Will start Linzess to try and decrease opioid effect on the bowels causing decreased motility. Drink 5 large glasses of water through out the day. Start reglan 5 mg po TID.

## 2021-09-15 ENCOUNTER — Encounter (INDEPENDENT_AMBULATORY_CARE_PROVIDER_SITE_OTHER): Payer: Self-pay | Admitting: Gastroenterology

## 2021-09-15 ENCOUNTER — Ambulatory Visit (INDEPENDENT_AMBULATORY_CARE_PROVIDER_SITE_OTHER): Payer: PPO | Admitting: Gastroenterology

## 2021-09-15 VITALS — BP 113/74 | HR 138 | Temp 97.9°F | Ht 64.5 in | Wt 240.2 lb

## 2021-09-15 DIAGNOSIS — K5903 Drug induced constipation: Secondary | ICD-10-CM | POA: Diagnosis not present

## 2021-09-15 MED ORDER — LACTULOSE 10 G PO PACK: 10.0000 g | PACK | Freq: Three times a day (TID) | ORAL | 0 refills | Status: DC

## 2021-09-15 NOTE — Progress Notes (Signed)
Referring Provider: Jake Samples, PA* Primary Care Physician:  Jake Samples, PA-C Primary GI Physician: Jenetta Downer  Chief Complaint  Patient presents with   Follow-up    Patient here today for a Ed follow up from 09/06/2021 at [facility] due to severe constipation. She is still having issues with constipation. She is taking Amitiza 24 mcg bid, Reglan 5 mg one every 8 hours. She is taking lactulose 15 ml Three times per day. She is taking senna two QHS, and takes Miralax prn. Patient is on pain medications due to a recent back surgery and was told the pain medication contributed to the constipation.   HPI:   Debbie Pineda is a 61 y.o. 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   Patient presenting today for hospital follow up of constipation.  History:  hx of intermittent elevation of transaminases dating back to February 2017.  She has undergone extensive work-up in the past. 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  She used to have pain in left upper quadrant but not lately.  She was seen in the office in August 2021 and her AST and ALT were 36 and 27 respectively.  Patient had blood work as a part of wellness visit and noted to have elevated alkaline phosphatase AST and ALT in February 2022,  She was ordered HFP with Alk phos 167 and ALT 83. abdominal US with findings of 55m CBD, [facility].   At last visit in June,  Patient reported that after the ERCP last year, they "left the opening,"  she has gained 50 pounds since then.  in the process of having this [facility] Otherwise seems to be doing well since the procedure. GERD seems to be doing pretty well, though she has had a sore throat for the past  few weeks and some hoarseness in the morning and does not note heartburn or acid regurgitation maybe a few times per week. Occasional cramping in abdomen, usually related to something that she ate. having some constipation at times. Pain management doctor started her on something for her constipation, she is unsure of the name of it, she is taking this only a couple of times per week. She typically is having 1-2 BMs per day now that she has changed her diet.   Patient had recent ED visit on 8/16 for abdominal pain and constipation. Recent spinal lumbar fusion 2 weeks prior, reported no BMs since starting opioates after surgery. CT A/P done at that time with prominent stool throughout colon. Patient had soap suds enema and SMOG enema during ED visit with improvement in symptoms. Discharged with lactulose 10g TID PRN.   Present:  She is taking oxycodone 10-323mq4-6 hours since back surgery a few weeks ago. States that she presented to the ED on 8/11 as she had been maybe 1+ weeks without a BM. Having severe abdominal pain and back pain. She was taking miralax at that time and amitiza 2448mBID without results. She states that she 1-2 enemas while she was in the hospital after her back surgery. She had 2 enemas during ED visit which helped. She was given lactulose to take TID PRN in the ED, she is currently taking this as well as her amitiza BID, senna x2 QHS,  started on Reglan yesterday by her surgeon which seemed to help. They are in the process of getting linzess 225mg approved for her. She had a good BM yesterday, states that after starting reglan she felt that her bowels moved pretty quickly after. Denies rectal bleeding or melena. Had some green stools a while back but now are brown in color. The last BM prior to yesterday was a few days ago when she had a soaps suds enema.  She is trying to drink a lot of water. Appetite is not great since her surgery. Having constipation prior to her surgery, taking  amitiza at that time with BMs every day to every other day.   CT A/P with contrast 09/06/21 Prominent stool throughout the colon favors constipation. 2. Other imaging findings of potential clinical significance: Small right renal angiomyolipoma. Pneumobilia. Prior cholecystectomy. Prior gastric bypass. Extensive postoperative findings in the thoracolumbar spine. ERCP: 06/02/20: No specimens collected.  - Gastric bypass. Gastrojejunal anastomosis characterized by healthy appearing mucosa. - Pre-existing gastrogastrostomy AXIOS stent. Removed and replaced with two 10Fr x 4cm double pigtail stents to maintain patency (given the anticipated need for  ampullary surveillance in the future). - A single ampullary polyp (adenomatous) Snare papillectomy of the  major papilla was performed. Resected and retrieved  with hemostasis achieved through the use of snare tip soft coagulation. - The entire main bile duct was mildly dilated. acquired. - A biliary sphincterotomy was performed.  - The biliary tree was swept and sludge was found. (Recommended repeat EGD in 6-12 months, pending path)                    Last Endoscopy:04/28/20  (EUS) The examined esophagus was endoscopically normal. Evidence of a gastric bypass was found. A gastric pouch with a small size was found. The staple line appeared intact. The gastrojejunal anastomosis was characterized by healthy appearing mucosa. This was traversed. The pouch-to-jejunum limb was characterized by healthy  appearing mucosa. The examined jejunum was normal.   Last Colonoscopy:march 2015, normal, small eternal hemorrhoids repeat in 10 years   Past Medical History:  Diagnosis Date   ADD (attention deficit disorder)    Arthritis    "all over"   [place]   Cerebrovascular disease 08/14/2016   Chronic back pain    "all over"   Chronic low back pain 174/08/1446  Complication of anesthesia    tends to have hypotension when NPO and  post-anesthesia   Constipation    takes stool softener daily   Degenerative disk disease    Degenerative joint disease    DVT [place]) [place]   RLE   Family history of adverse reaction to anesthesia    a family member woke up during surgery; "think it was my mom"   Fibromyalgia    Generalized osteoarthritis of multiple sites 11/04/2013   GERD (gastroesophageal reflux disease)    Headache    Heart palpitations 12/14/2013   resolved   History of blood clots    superficial   Hypoglycemia    Hypothyroid    takes Synthroid daily   Incomplete emptying of bladder    Insomnia    takes Trazodone nightly   Iron deficiency anemia    takes Ferrous Sulfate daily   Joint pain    Joint swelling    knees and ankles   Memory disorder 08/14/2016   Morbid obesity ([place]    Nausea    takes Zofran as needed.Seeing GI  doc   Neck pain 05/25/2016   OSA on CPAP    tested more than 5 yrs ago.     Osteoarthritis    Osteopenia    in feet   PFO (patent foramen ovale)    no murmur   Pre-diabetes    Primary osteoarthritis of both feet 05/29/2016   Right bunionectomy August 2017 by Dr. Sharol Given   Scoliosis    Skin abnormality 02/04/2020   raised area on lip   Stroke [facility]) "several"last 2014   right foot weakness; memory issues, black spot right visual field since" (03/23/2015)   Thrombophlebitis    Trochanteric bursitis of both hips 05/25/2016   Unilateral primary osteoarthritis, right knee 05/29/2016   Urinary urgency 04/19/2016   Vein disorder 11/29/2010   varicose veins both legs   Wears glasses     Past Surgical History:  Procedure Laterality Date   BIOPSY  11/14/2018   Procedure: BIOPSY;  Surgeon: Rogene Houston, MD;  [facility];  Service: Endoscopy;;  esophagusgastric   BONE EXCISION Right 08/29/2017   Procedure: right trapezium excision;  Surgeon: Daryll Brod, MD;  Location: [place];  Service: Orthopedics;  Laterality: Right;    BUNIONECTOMY Right 08/2015   CARDIAC CATHETERIZATION     2008.  "it was fine" (not sure why she had it done, and doesn't know where)   CARPOMETACARPEL SUSPENSION PLASTY Right 08/29/2017   Procedure: SUSPENSION PLASTY RIGHT THUMB;  Surgeon: Daryll Brod, MD;  Location: [place];  Service: Orthopedics;  Laterality: Right;   COLONOSCOPY N/A 03/25/2013   Procedure: COLONOSCOPY;  Surgeon: Rogene Houston, MD;  [facility];  Service: Endoscopy;  Laterality: N/A;  930   ERCP  06/02/2020   ESOPHAGOGASTRODUODENOSCOPY     ESOPHAGOGASTRODUODENOSCOPY (EGD) WITH PROPOFOL N/A 11/14/2018   Procedure: ESOPHAGOGASTRODUODENOSCOPY (EGD) WITH PROPOFOL;  Surgeon: Rogene Houston, MD;  [facility];  Service: Endoscopy;  Laterality: N/A;  1:55pm-office moved to 11:00am/pt notified to arrive at 9:30am per [place]     "took fallopian tubes out"   [place] Right 02/12/2020   Procedure: HALLUX INTERPHANGEAL JOINT  FUSION;  Surgeon: Felipa Furnace, DPM;  Location: [place];  Service: Podiatry;  Laterality: Right;   HAMMER TOE SURGERY Right 02/12/2020   Procedure: SECOND AND THIRD HAMMER TOE CORRECTION; CAPSULOTOMY SECOND INTERPHALANGEAL JOINT;  Surgeon: Felipa Furnace, DPM;  Location: [place];  Service: Podiatry;  Laterality: Right;   JOINT REPLACEMENT     bil knee    KNEE ARTHROSCOPY Left    KNEE ARTHROSCOPY W/ ACL RECONSTRUCTION Right yrs ago   "added pins"   LAPAROSCOPIC CHOLECYSTECTOMY  ~ 2001   ROUX-EN-Y GASTRIC BYPASS  11/20/2010   [place]   SPINAL CORD STIMULATOR INSERTION N/A 04/18/2017   Procedure: LUMBAR SPINAL CORD STIMULATOR INSERTION;  Surgeon: Clydell Hakim, MD;  Location: [place];  Service: Neurosurgery;  Laterality: N/A;  LUMBAR SPINAL CORD STIMULATOR INSERTION   stomach stent  04/28/2020   TENDON TRANSFER Right 08/29/2017   Procedure: right abductor pollicis longus transfer;  Surgeon: Daryll Brod, MD;   Location: [place];  Service: Orthopedics;  Laterality: Right;   TOTAL KNEE ARTHROPLASTY Left 03/23/2015   Procedure: TOTAL KNEE ARTHROPLASTY;  Surgeon: Newt Minion, MD;  Location: [place];  Service: Orthopedics;  Laterality: Left;   TOTAL KNEE ARTHROPLASTY Right 08/15/2016   Procedure: RIGHT TOTAL KNEE ARTHROPLASTY, REMOVAL ACL  SCREWS;  Surgeon: Newt Minion, MD;  Location: [place];  Service: Orthopedics;  Laterality: Right;   TOTAL KNEE ARTHROPLASTY WITH HARDWARE REMOVAL Right    VAGINAL HYSTERECTOMY     tah/bso   VARICOSE VEIN SURGERY Right X 2   WEIL OSTEOTOMY Right 02/12/2020   Procedure: DOUBLE L OSTEOTOMY;  Surgeon: Felipa Furnace, DPM;  Location: [place];  Service: Podiatry;  Laterality: Right;    Current Outpatient Medications  Medication Sig Dispense Refill   baclofen (LIORESAL) 10 MG tablet Take 1 tablet (10 mg total) by mouth 3 (three) times daily. 90 each 0   Cyanocobalamin (VITAMIN B-12) 5000 MCG SUBL Place 5,000 mcg under the tongue daily.      diclofenac sodium (VOLTAREN) 1 % GEL Apply 4 g topically 4 (four) times daily as needed (PAIN). 5 Tube 1   diphenhydrAMINE (BENADRYL) 50 MG tablet Take 1 tablet (50 mg total) by mouth every 8 (eight) hours as needed for itching. 30 tablet 0   donepezil (ARICEPT) 10 MG tablet Take 1 tablet (10 mg total) by mouth at bedtime. 30 tablet 0   DULoxetine (CYMBALTA) 60 MG capsule Take 1 capsule (60 mg total) by mouth 2 (two) times daily. 60 capsule 0   famotidine (PEPCID) 40 MG tablet Take 1 tablet (40 mg total) by mouth at bedtime. 30 tablet 0   ferrous gluconate (FERGON) 324 MG tablet Take 1 tablet (324 mg total) by mouth 2 (two) times daily with a meal. 60 tablet 0   folic acid (FOLVITE) 035 MCG tablet Take 400 mcg by mouth daily.     Galcanezumab-gnlm (EMGALITY) 120 MG/ML SOSY Inject 120 mg into the skin every 30 (thirty) days. 1.12 mL 4   Krill Oil 350 MG CAPS Take 350 mg by mouth at bedtime.      lactulose  (CEPHULAC) 10 g packet Take 1 packet (10 g total) by mouth 3 (three) times daily as needed (constipation). (Patient taking differently: Take 15 mLs by mouth 3 (three) times daily as needed (constipation).) 30 each 0   levothyroxine (SYNTHROID) 125 MCG tablet Take 1 tablet (125 mcg total) by mouth daily before breakfast. 30 tablet 0   lubiprostone (AMITIZA) 24 MCG capsule Take 1 capsule (24 mcg total) by mouth 2 (two) times daily with a meal. 60 capsule 0   Methylfol-Methylcob-Acetylcyst (METAFOLBIC PLUS) 6-2-600 MG TABS Take 1 tablet by mouth daily. 30 tablet 0   methylphenidate (CONCERTA) 27 MG PO CR tablet Take 1 tablet (27 mg total) by mouth 2 (two) times daily. 10 am & 1400 60 tablet 0   metoCLOPramide (REGLAN) 5 MG tablet Take 1 tablet (5 mg total) by mouth every 8 (eight) hours as needed for nausea. 90 tablet 2   miconazole (ZEASORB-AF) 2 % powder Every shift as needed. Apply to red areas in folds of skin.     mirabegron ER (MYRBETRIQ) 50 MG TB24 tablet Take 1 tablet (50 mg total) by mouth daily. 30 tablet 0   Multiple Minerals-Vitamins (CAL-MAG-ZINC-D PO) Take 3 tablets by mouth daily. 333 mg-133 unit -133 mg-5 mg     Multiple Vitamins-Minerals (ONE-A-DAY WOMENS PETITES PO) Take 1 tablet by mouth 2 (two) times daily.     NARCAN 4 MG/0.1ML LIQD nasal spray kit Place 0.1 sprays (0.4 mg total) into the nose 2 (two) times daily as needed. 1 each 0   NUCYNTA ER 100 MG 12 hr tablet SMARTSIG:1 Tablet(s) By Mouth Every 12 Hours  omeprazole (PRILOSEC) 40 MG capsule Take one capsule by mouth daily before breaksfast. 30 capsule 0   ondansetron (ZOFRAN-ODT) 4 MG disintegrating tablet Take 1 tablet (4 mg total) by mouth every 8 (eight) hours as needed for nausea or vomiting. 20 tablet 0   oxyCODONE-acetaminophen (PERCOCET) 10-325 MG tablet Take 1 tablet by mouth every 6 (six) hours as needed for pain (post operative pain control due to thoracolumbar fusion surgery). 40 tablet 0   polyethylene glycol  (MIRALAX / GLYCOLAX) 17 g packet Take 17 g by mouth daily as needed for mild constipation or moderate constipation.     Rimegepant Sulfate (NURTEC) 75 MG TBDP Take 75 mg by mouth daily as needed. 8 tablet 0   rivaroxaban (XARELTO) 20 MG TABS tablet Take 1 tablet (20 mg total) by mouth daily at 6 PM. 30 tablet 0   tiZANidine (ZANAFLEX) 4 MG tablet Take 1 tablet (4 mg total) by mouth every 6 (six) hours as needed for muscle spasms. 30 tablet 0   topiramate (TOPAMAX) 100 MG tablet Take 1 tablet (100 mg total) by mouth 2 (two) times daily. 60 tablet 0   traZODone (DESYREL) 100 MG tablet Take 0.5 tablets (50 mg total) by mouth at bedtime. (Patient taking differently: Take 100 mg by mouth at bedtime.) 15 tablet 0   trospium (SANCTURA) 20 MG tablet Take 1 tablet (20 mg total) by mouth 2 (two) times daily. 60 tablet 0   vitamin C (ASCORBIC ACID) 500 MG tablet Take 500 mg by mouth daily.     Cholecalciferol (VITAMIN D3) 125 MCG (5000 UT) TABS Take 5,000 Units by mouth daily. (Patient not taking: Reported on 09/15/2021)     docusate sodium (COLACE) 100 MG capsule Take 1 capsule (100 mg total) by mouth 2 (two) times daily. (Patient not taking: Reported on 09/15/2021) 10 capsule 0   Current Facility-Administered Medications  Medication Dose Route Frequency Provider Last Rate Last Admin   linaclotide (LINZESS) capsule 290 mcg  290 mcg Oral QAC breakfast Jessy Oto, MD        Allergies as of 09/15/2021 - Review Complete 09/15/2021  Allergen Reaction Noted   Lyrica [pregabalin] Shortness Of Breath and Swelling 12/01/2015   Belsomra [suvorexant] Other (See Comments) 12/01/2015   Morphine and related Itching 10/26/2010   Sulfamethoxazole-trimethoprim Itching and Rash 03/10/2015   Tape Itching and Rash 03/10/2015    Family History  Problem Relation Age of Onset   Cancer Mother        skin and breast   Myasthenia gravis Mother    Breast cancer Mother    Heart disease Father    Cancer Father     Parkinson's disease Father    Heart disease Sister    Heart attack Sister    Heart disease Brother    Cancer Brother    Diabetes Brother    Stroke Brother    Cancer Maternal Grandfather    Hypothyroidism Daughter    Hypertension Other    Colon cancer Neg Hx     Social History   Socioeconomic History   Marital status: Married    Spouse name: Jeneen Rinks   Number of children: 1   Years of education: 14   Highest education level: Not on file  Occupational History   Occupation: Disability   Occupation: formerly Therapist, sports, [employer]  Tobacco Use   Smoking status: Former    Packs/day: 0.75    Years: 8.00    Total pack years: 6.00    Types:  Cigarettes    Quit date: 12/01/1990    Years since quitting: 30.8    Passive exposure: Past   Smokeless tobacco: Never   Tobacco comments:    quit smoking in the 1990s  Vaping Use   Vaping Use: Never used  Substance and Sexual Activity   Alcohol use: No    Comment: 03/23/2015 "stopped drinking in 2012 w/gastric bypass; drank socially before bypass"   Drug use: No   Sexual activity: Not Currently    Birth control/protection: Surgical  Other Topics Concern   Not on file  Social History Narrative   Lives with husband   Caffeine use: No soda   Mainly water, drinks decaf tea   Right handed   Social Determinants of Health   Financial Resource Strain: Not on file  Food Insecurity: Not on file  Transportation Needs: Not on file  Physical Activity: Not on file  Stress: Not on file  Social Connections: Not on file   Review of systems General: negative for malaise, night sweats, fever, chills, weight loss Neck: Negative for lumps, goiter, pain and significant neck swelling Resp: Negative for cough, wheezing, dyspnea at rest CV: Negative for chest pain, leg swelling, palpitations, orthopnea GI: denies melena, hematochezia, nausea, vomiting, diarrhea, dysphagia, odyonophagia, early satiety or unintentional weight loss. +constipation MSK: Negative for  joint pain or swelling, back pain, and muscle pain. Derm: Negative for itching or rash Psych: Denies depression, anxiety, memory loss, confusion. No homicidal or suicidal ideation.  Heme: Negative for prolonged bleeding, bruising easily, and swollen nodes. Endocrine: Negative for cold or heat intolerance, polyuria, polydipsia and goiter. Neuro: negative for tremor, gait imbalance, syncope and seizures. The remainder of the review of systems is noncontributory.  Physical Exam: BP 113/74 (BP Location: Right Arm, Patient Position: Sitting, Cuff Size: Large)   Pulse (!) 138   Temp 97.9 F (36.6 C) (Oral)   Ht 5' 4.5" (1.638 m)   Wt 240 lb 3.2 oz (109 kg)   BMI 40.59 kg/m  General:   Alert and oriented. No distress noted. Pleasant and cooperative.  Head:  Normocephalic and atraumatic. Eyes:  Conjuctiva clear without scleral icterus. Mouth:  Oral mucosa pink and moist. Good dentition. No lesions. Heart: Normal rate and rhythm, s1 and s2 heart sounds present.  Lungs: Clear lung sounds in all lobes. Respirations equal and unlabored. Abdomen:  +BS, soft, non-tender and non-distended. No rebound or guarding. No HSM or masses noted. Derm: No palmar erythema or jaundice Msk:  Symmetrical without gross deformities. Normal posture. Extremities:  Without edema. Neurologic:  Alert and  oriented x4 Psych:  Alert and cooperative. Normal mood and affect.  Invalid input(s): "6 MONTHS"   ASSESSMENT: Debbie Pineda is a 61 y.o. female presenting today for constipation.  Recent ED visit for constipation after back surgery and increase in opiate pain medication, required 2 enemas during ED visit which produced results. Currently on amitiza 48mg BID, lactulose TID, reglan 537mq8h and senna x2 QHS, still having to do enemas at home to produce a BM. Surgeon sent in [id number] yesterday to try in place of amitiza however this has not been approved yet. I encouraged patient to continue with ample water  intake. We discussed that high doses of opiates are known to cause constipation. Will provide samples of linzess 29084mfor the next few days until prescription sent by surgeon is approved. Can continue with lactulose PRN for now, if she does not note an improvement in bowel habits within  about 1 week of linzess, we may need to consider something such as Movantik with ongoing use of opiate pain medications. She can continue reglan as prescribed as she feels that this is helping some with her bowels moving.    PLAN:  Linzess samples 281mg 2. Continue ample water intake  3. Lactulose PRN TID 4. Can continue reglan with good result 5. Pt to make me aware if constipation is not improved with use of linzess 6. Consider movantik if no results with linzess 7. Goal is BM every other day, no more than 2 days without a BM  All questions were answered, patient verbalized understanding and is in agreement with plan as outlined above.   Follow Up: Has follow up in december  Kawanda Drumheller L. CAlver Sorrow MSN, APRN, AGNP-C Adult-Gerontology Nurse Practitioner [facility]

## 2021-09-15 NOTE — Patient Instructions (Signed)
Continue with plenty of water I am providing samples of linzess 230mg, take one daily in the morning You should start to see results within the next few days You can continue reglan if this is helping I will send refill of lactulose to take as needed Goal is to have a BM atleast every other day, try not to go more than 2 days without one If you are still having a lot of difficulty with BMs even after a few days on the linzess, please let me know as we may need to try another medication that is specifically for opiate induced constipation.

## 2021-09-15 NOTE — Telephone Encounter (Signed)
Holding for [employer].

## 2021-09-18 ENCOUNTER — Telehealth (INDEPENDENT_AMBULATORY_CARE_PROVIDER_SITE_OTHER): Payer: Self-pay | Admitting: *Deleted

## 2021-09-18 NOTE — Telephone Encounter (Signed)
Pt seen last week. Reports she told you at visit her surgeon sent in [place] for her to take in place of amitiza. She called back to say surgeon did not send in V[place]um and she reports her stools have been fluid since taking linzess samples. She is wanting to try the other medication you talked to her about. She did not know the name but I saw movantik in your note.  [facility].   [phone]

## 2021-09-19 ENCOUNTER — Other Ambulatory Visit: Payer: Self-pay | Admitting: *Deleted

## 2021-09-19 ENCOUNTER — Other Ambulatory Visit (INDEPENDENT_AMBULATORY_CARE_PROVIDER_SITE_OTHER): Payer: Self-pay | Admitting: Gastroenterology

## 2021-09-19 DIAGNOSIS — K5903 Drug induced constipation: Secondary | ICD-10-CM | POA: Diagnosis not present

## 2021-09-19 DIAGNOSIS — K219 Gastro-esophageal reflux disease without esophagitis: Secondary | ICD-10-CM | POA: Diagnosis not present

## 2021-09-19 DIAGNOSIS — Z86711 Personal history of pulmonary embolism: Secondary | ICD-10-CM | POA: Diagnosis not present

## 2021-09-19 DIAGNOSIS — G4733 Obstructive sleep apnea (adult) (pediatric): Secondary | ICD-10-CM | POA: Diagnosis not present

## 2021-09-19 DIAGNOSIS — M8580 Other specified disorders of bone density and structure, unspecified site: Secondary | ICD-10-CM | POA: Diagnosis not present

## 2021-09-19 DIAGNOSIS — M159 Polyosteoarthritis, unspecified: Secondary | ICD-10-CM | POA: Diagnosis not present

## 2021-09-19 DIAGNOSIS — Z7901 Long term (current) use of anticoagulants: Secondary | ICD-10-CM | POA: Diagnosis not present

## 2021-09-19 DIAGNOSIS — F5101 Primary insomnia: Secondary | ICD-10-CM | POA: Diagnosis not present

## 2021-09-19 DIAGNOSIS — G894 Chronic pain syndrome: Secondary | ICD-10-CM | POA: Diagnosis not present

## 2021-09-19 DIAGNOSIS — F909 Attention-deficit hyperactivity disorder, unspecified type: Secondary | ICD-10-CM | POA: Diagnosis not present

## 2021-09-19 DIAGNOSIS — M519 Unspecified thoracic, thoracolumbar and lumbosacral intervertebral disc disorder: Secondary | ICD-10-CM | POA: Diagnosis not present

## 2021-09-19 DIAGNOSIS — D62 Acute posthemorrhagic anemia: Secondary | ICD-10-CM | POA: Diagnosis not present

## 2021-09-19 DIAGNOSIS — Z90710 Acquired absence of both cervix and uterus: Secondary | ICD-10-CM | POA: Diagnosis not present

## 2021-09-19 DIAGNOSIS — M797 Fibromyalgia: Secondary | ICD-10-CM | POA: Diagnosis not present

## 2021-09-19 DIAGNOSIS — Z8673 Personal history of transient ischemic attack (TIA), and cerebral infarction without residual deficits: Secondary | ICD-10-CM | POA: Diagnosis not present

## 2021-09-19 DIAGNOSIS — G43709 Chronic migraine without aura, not intractable, without status migrainosus: Secondary | ICD-10-CM | POA: Diagnosis not present

## 2021-09-19 DIAGNOSIS — E039 Hypothyroidism, unspecified: Secondary | ICD-10-CM | POA: Diagnosis not present

## 2021-09-19 DIAGNOSIS — Z96653 Presence of artificial knee joint, bilateral: Secondary | ICD-10-CM | POA: Diagnosis not present

## 2021-09-19 DIAGNOSIS — R7303 Prediabetes: Secondary | ICD-10-CM | POA: Diagnosis not present

## 2021-09-19 DIAGNOSIS — N3281 Overactive bladder: Secondary | ICD-10-CM | POA: Diagnosis not present

## 2021-09-19 DIAGNOSIS — Z4789 Encounter for other orthopedic aftercare: Secondary | ICD-10-CM | POA: Diagnosis not present

## 2021-09-19 DIAGNOSIS — Z981 Arthrodesis status: Secondary | ICD-10-CM | POA: Diagnosis not present

## 2021-09-19 DIAGNOSIS — M419 Scoliosis, unspecified: Secondary | ICD-10-CM | POA: Diagnosis not present

## 2021-09-19 MED ORDER — NALOXEGOL OXALATE 25 MG PO TABS: 25.0000 mg | ORAL_TABLET | Freq: Every day | ORAL | 1 refills | Status: DC

## 2021-09-19 MED ORDER — NALOXEGOL OXALATE 25 MG PO TABS
25.0000 mg | ORAL_TABLET | Freq: Every day | ORAL | 1 refills | Status: DC
Start: 2021-09-19 — End: 2021-09-19

## 2021-09-19 NOTE — Telephone Encounter (Signed)
Patient notified and she wanted to go to [facility] instead of [facility].I called [facility] and canceled rx and resent to [facility]. Pt aware

## 2021-09-21 ENCOUNTER — Telehealth (INDEPENDENT_AMBULATORY_CARE_PROVIDER_SITE_OTHER): Payer: Self-pay | Admitting: *Deleted

## 2021-09-21 NOTE — Telephone Encounter (Signed)
I called and advised that I am still looking for her an appointment for her.

## 2021-09-21 NOTE — Telephone Encounter (Signed)
Pt called to check on status of pa for movantik. I let her know I sent it in yesterday and its still pending today.

## 2021-09-22 NOTE — Telephone Encounter (Signed)
Patient aware still pending. And per chelesea ok to give some more linzess 290 mcg samples. Samples left at the front window.

## 2021-09-23 ENCOUNTER — Other Ambulatory Visit: Payer: Self-pay | Admitting: Psychiatry

## 2021-09-23 DIAGNOSIS — G43909 Migraine, unspecified, not intractable, without status migrainosus: Secondary | ICD-10-CM

## 2021-09-26 ENCOUNTER — Other Ambulatory Visit (INDEPENDENT_AMBULATORY_CARE_PROVIDER_SITE_OTHER): Payer: Self-pay | Admitting: Gastroenterology

## 2021-09-26 ENCOUNTER — Telehealth (INDEPENDENT_AMBULATORY_CARE_PROVIDER_SITE_OTHER): Payer: Self-pay

## 2021-09-26 DIAGNOSIS — M519 Unspecified thoracic, thoracolumbar and lumbosacral intervertebral disc disorder: Secondary | ICD-10-CM | POA: Diagnosis not present

## 2021-09-26 DIAGNOSIS — G4733 Obstructive sleep apnea (adult) (pediatric): Secondary | ICD-10-CM | POA: Diagnosis not present

## 2021-09-26 DIAGNOSIS — R7303 Prediabetes: Secondary | ICD-10-CM | POA: Diagnosis not present

## 2021-09-26 DIAGNOSIS — G43709 Chronic migraine without aura, not intractable, without status migrainosus: Secondary | ICD-10-CM | POA: Diagnosis not present

## 2021-09-26 DIAGNOSIS — K5903 Drug induced constipation: Secondary | ICD-10-CM | POA: Diagnosis not present

## 2021-09-26 DIAGNOSIS — Z86711 Personal history of pulmonary embolism: Secondary | ICD-10-CM | POA: Diagnosis not present

## 2021-09-26 DIAGNOSIS — D62 Acute posthemorrhagic anemia: Secondary | ICD-10-CM | POA: Diagnosis not present

## 2021-09-26 DIAGNOSIS — F909 Attention-deficit hyperactivity disorder, unspecified type: Secondary | ICD-10-CM | POA: Diagnosis not present

## 2021-09-26 DIAGNOSIS — E039 Hypothyroidism, unspecified: Secondary | ICD-10-CM | POA: Diagnosis not present

## 2021-09-26 DIAGNOSIS — M419 Scoliosis, unspecified: Secondary | ICD-10-CM | POA: Diagnosis not present

## 2021-09-26 DIAGNOSIS — G894 Chronic pain syndrome: Secondary | ICD-10-CM | POA: Diagnosis not present

## 2021-09-26 DIAGNOSIS — Z4789 Encounter for other orthopedic aftercare: Secondary | ICD-10-CM | POA: Diagnosis not present

## 2021-09-26 DIAGNOSIS — Z8673 Personal history of transient ischemic attack (TIA), and cerebral infarction without residual deficits: Secondary | ICD-10-CM | POA: Diagnosis not present

## 2021-09-26 DIAGNOSIS — M159 Polyosteoarthritis, unspecified: Secondary | ICD-10-CM | POA: Diagnosis not present

## 2021-09-26 DIAGNOSIS — Z981 Arthrodesis status: Secondary | ICD-10-CM | POA: Diagnosis not present

## 2021-09-26 DIAGNOSIS — F5101 Primary insomnia: Secondary | ICD-10-CM | POA: Diagnosis not present

## 2021-09-26 DIAGNOSIS — M797 Fibromyalgia: Secondary | ICD-10-CM | POA: Diagnosis not present

## 2021-09-26 DIAGNOSIS — Z7901 Long term (current) use of anticoagulants: Secondary | ICD-10-CM | POA: Diagnosis not present

## 2021-09-26 DIAGNOSIS — Z96653 Presence of artificial knee joint, bilateral: Secondary | ICD-10-CM | POA: Diagnosis not present

## 2021-09-26 DIAGNOSIS — N3281 Overactive bladder: Secondary | ICD-10-CM | POA: Diagnosis not present

## 2021-09-26 DIAGNOSIS — Z90710 Acquired absence of both cervix and uterus: Secondary | ICD-10-CM | POA: Diagnosis not present

## 2021-09-26 DIAGNOSIS — K219 Gastro-esophageal reflux disease without esophagitis: Secondary | ICD-10-CM | POA: Diagnosis not present

## 2021-09-26 DIAGNOSIS — M8580 Other specified disorders of bone density and structure, unspecified site: Secondary | ICD-10-CM | POA: Diagnosis not present

## 2021-09-26 NOTE — Telephone Encounter (Signed)
Fax from [employer]. Movantik is approved from 09/26/21 -01/21/22. I called [facility] and med did go through with co pay of $19.33. I called and notified patient.

## 2021-09-26 NOTE — Telephone Encounter (Signed)
Patient made aware states understanding.

## 2021-09-26 NOTE — Telephone Encounter (Signed)
I called [facility] and they were going to send fax with additional questions needed. Await fax.

## 2021-09-26 NOTE — Telephone Encounter (Signed)
Per [place], no she does not need Lactulose.Can use the linzess today and start the [facility] tomorrow.   Needs lactulose refill if we want her to continue on it, wants sent to [facility]. Cant get the pharmacy to deliver it until tomorrow. If Lactulose to continue, have them deliver it with the Movantik.

## 2021-09-26 NOTE — Telephone Encounter (Signed)
I renewed request since we never got answer and resubmitted through cover my meds today. It has a status of question request but not questions. I chatted with Suezanne Jacquet from cover my meds and he told me to wait 24 hours for the questions or call [facility] [phone].

## 2021-09-26 NOTE — Telephone Encounter (Signed)
Form from [employer] received with additional questions and I filled out and faxed back. Patient was called and given an update.

## 2021-09-26 NOTE — Telephone Encounter (Signed)
Patient also called and left voicemail that she called insurance and was told request was never sent in. I spoke with insurance after this and they are suppose to fax me a form to fill out with additional questions they need.

## 2021-09-26 NOTE — Telephone Encounter (Signed)
Still pending

## 2021-09-29 DIAGNOSIS — G894 Chronic pain syndrome: Secondary | ICD-10-CM | POA: Diagnosis not present

## 2021-09-29 DIAGNOSIS — Z4789 Encounter for other orthopedic aftercare: Secondary | ICD-10-CM | POA: Diagnosis not present

## 2021-09-29 DIAGNOSIS — Z7901 Long term (current) use of anticoagulants: Secondary | ICD-10-CM | POA: Diagnosis not present

## 2021-09-29 DIAGNOSIS — M419 Scoliosis, unspecified: Secondary | ICD-10-CM | POA: Diagnosis not present

## 2021-09-29 DIAGNOSIS — M8580 Other specified disorders of bone density and structure, unspecified site: Secondary | ICD-10-CM | POA: Diagnosis not present

## 2021-09-29 DIAGNOSIS — M797 Fibromyalgia: Secondary | ICD-10-CM | POA: Diagnosis not present

## 2021-09-29 DIAGNOSIS — G43709 Chronic migraine without aura, not intractable, without status migrainosus: Secondary | ICD-10-CM | POA: Diagnosis not present

## 2021-09-29 DIAGNOSIS — D62 Acute posthemorrhagic anemia: Secondary | ICD-10-CM | POA: Diagnosis not present

## 2021-09-29 DIAGNOSIS — K219 Gastro-esophageal reflux disease without esophagitis: Secondary | ICD-10-CM | POA: Diagnosis not present

## 2021-09-29 DIAGNOSIS — E039 Hypothyroidism, unspecified: Secondary | ICD-10-CM | POA: Diagnosis not present

## 2021-09-29 DIAGNOSIS — R7303 Prediabetes: Secondary | ICD-10-CM | POA: Diagnosis not present

## 2021-09-29 DIAGNOSIS — G4733 Obstructive sleep apnea (adult) (pediatric): Secondary | ICD-10-CM | POA: Diagnosis not present

## 2021-09-29 DIAGNOSIS — M519 Unspecified thoracic, thoracolumbar and lumbosacral intervertebral disc disorder: Secondary | ICD-10-CM | POA: Diagnosis not present

## 2021-09-29 DIAGNOSIS — N3281 Overactive bladder: Secondary | ICD-10-CM | POA: Diagnosis not present

## 2021-09-29 DIAGNOSIS — F5101 Primary insomnia: Secondary | ICD-10-CM | POA: Diagnosis not present

## 2021-09-29 DIAGNOSIS — M159 Polyosteoarthritis, unspecified: Secondary | ICD-10-CM | POA: Diagnosis not present

## 2021-09-29 DIAGNOSIS — Z90710 Acquired absence of both cervix and uterus: Secondary | ICD-10-CM | POA: Diagnosis not present

## 2021-09-29 DIAGNOSIS — Z981 Arthrodesis status: Secondary | ICD-10-CM | POA: Diagnosis not present

## 2021-09-29 DIAGNOSIS — Z8673 Personal history of transient ischemic attack (TIA), and cerebral infarction without residual deficits: Secondary | ICD-10-CM | POA: Diagnosis not present

## 2021-09-29 DIAGNOSIS — F909 Attention-deficit hyperactivity disorder, unspecified type: Secondary | ICD-10-CM | POA: Diagnosis not present

## 2021-09-29 DIAGNOSIS — Z86711 Personal history of pulmonary embolism: Secondary | ICD-10-CM | POA: Diagnosis not present

## 2021-09-29 DIAGNOSIS — Z96653 Presence of artificial knee joint, bilateral: Secondary | ICD-10-CM | POA: Diagnosis not present

## 2021-09-29 DIAGNOSIS — K5903 Drug induced constipation: Secondary | ICD-10-CM | POA: Diagnosis not present

## 2021-09-29 NOTE — Telephone Encounter (Signed)
Pt will keep appt for 10/05/21

## 2021-10-03 DIAGNOSIS — N3281 Overactive bladder: Secondary | ICD-10-CM | POA: Diagnosis not present

## 2021-10-03 DIAGNOSIS — R7303 Prediabetes: Secondary | ICD-10-CM | POA: Diagnosis not present

## 2021-10-03 DIAGNOSIS — F909 Attention-deficit hyperactivity disorder, unspecified type: Secondary | ICD-10-CM | POA: Diagnosis not present

## 2021-10-03 DIAGNOSIS — Z4789 Encounter for other orthopedic aftercare: Secondary | ICD-10-CM | POA: Diagnosis not present

## 2021-10-03 DIAGNOSIS — Z981 Arthrodesis status: Secondary | ICD-10-CM | POA: Diagnosis not present

## 2021-10-03 DIAGNOSIS — M519 Unspecified thoracic, thoracolumbar and lumbosacral intervertebral disc disorder: Secondary | ICD-10-CM | POA: Diagnosis not present

## 2021-10-03 DIAGNOSIS — K219 Gastro-esophageal reflux disease without esophagitis: Secondary | ICD-10-CM | POA: Diagnosis not present

## 2021-10-03 DIAGNOSIS — K5903 Drug induced constipation: Secondary | ICD-10-CM | POA: Diagnosis not present

## 2021-10-03 DIAGNOSIS — Z8673 Personal history of transient ischemic attack (TIA), and cerebral infarction without residual deficits: Secondary | ICD-10-CM | POA: Diagnosis not present

## 2021-10-03 DIAGNOSIS — M797 Fibromyalgia: Secondary | ICD-10-CM | POA: Diagnosis not present

## 2021-10-03 DIAGNOSIS — Z7901 Long term (current) use of anticoagulants: Secondary | ICD-10-CM | POA: Diagnosis not present

## 2021-10-03 DIAGNOSIS — F5101 Primary insomnia: Secondary | ICD-10-CM | POA: Diagnosis not present

## 2021-10-03 DIAGNOSIS — M419 Scoliosis, unspecified: Secondary | ICD-10-CM | POA: Diagnosis not present

## 2021-10-03 DIAGNOSIS — G894 Chronic pain syndrome: Secondary | ICD-10-CM | POA: Diagnosis not present

## 2021-10-03 DIAGNOSIS — M159 Polyosteoarthritis, unspecified: Secondary | ICD-10-CM | POA: Diagnosis not present

## 2021-10-03 DIAGNOSIS — M8580 Other specified disorders of bone density and structure, unspecified site: Secondary | ICD-10-CM | POA: Diagnosis not present

## 2021-10-03 DIAGNOSIS — G43709 Chronic migraine without aura, not intractable, without status migrainosus: Secondary | ICD-10-CM | POA: Diagnosis not present

## 2021-10-03 DIAGNOSIS — Z96653 Presence of artificial knee joint, bilateral: Secondary | ICD-10-CM | POA: Diagnosis not present

## 2021-10-03 DIAGNOSIS — Z90710 Acquired absence of both cervix and uterus: Secondary | ICD-10-CM | POA: Diagnosis not present

## 2021-10-03 DIAGNOSIS — Z86711 Personal history of pulmonary embolism: Secondary | ICD-10-CM | POA: Diagnosis not present

## 2021-10-03 DIAGNOSIS — G4733 Obstructive sleep apnea (adult) (pediatric): Secondary | ICD-10-CM | POA: Diagnosis not present

## 2021-10-03 DIAGNOSIS — E039 Hypothyroidism, unspecified: Secondary | ICD-10-CM | POA: Diagnosis not present

## 2021-10-03 DIAGNOSIS — D62 Acute posthemorrhagic anemia: Secondary | ICD-10-CM | POA: Diagnosis not present

## 2021-10-05 ENCOUNTER — Ambulatory Visit: Payer: Self-pay

## 2021-10-05 ENCOUNTER — Ambulatory Visit (INDEPENDENT_AMBULATORY_CARE_PROVIDER_SITE_OTHER): Payer: PPO | Admitting: Specialist

## 2021-10-05 ENCOUNTER — Encounter: Payer: Self-pay | Admitting: Specialist

## 2021-10-05 VITALS — BP 104/69 | HR 78 | Ht 64.5 in | Wt 240.2 lb

## 2021-10-05 DIAGNOSIS — Z981 Arthrodesis status: Secondary | ICD-10-CM

## 2021-10-05 NOTE — Progress Notes (Signed)
Post-Op Visit Note   Patient: Debbie Pineda           Date of Birth: 28-Oct-1960           MRN: [id number] Visit Date: 10/05/2021 PCP: Jake Samples, PA-C   Assessment & Plan: 6 weeks post op T10 to S1  thoracolumbar fusion, normal motor.   Chief Complaint:  Chief Complaint  Patient presents with   Lower Back - Follow-up, Routine Post Op  Stopped linzess and has started monvantik. Bowels are moving daily. Motor is normal SLR is normal Sensory intact  Incision Visit Diagnoses:  1. S/P lumbar fusion     Plan: Avoid frequent bending and stooping  No lifting greater than 10 lbs. May use ice or moist heat for pain. Weight loss is of benefit. Best medication for lumbar disc disease is arthritis medications like motrin, celebrex and naprosyn. Exercise is important to improve your indurance and does allow people to function better inspite of back pain.  May bath as needed Pool exercise, removing brace while in pool. Okay to drive.  Follow-Up Instructions: No follow-ups on file.   Orders:  Orders Placed This Encounter  Procedures   XR THORACOLUMABAR SPINE   No orders of the defined types were placed in this encounter.   Imaging: No results found.  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   Gastroesophageal reflux disease 09/22/2019   SSBE (short-segment Barrett's esophagus)  09/22/2019   Flatulence 09/22/2019   Migraine 11/11/2018   Abdominal pain, epigastric 10/14/2018   LUQ pain 10/14/2018   Attention deficit hyperactivity disorder (ADHD) 01/12/2018   Insomnia 01/12/2018   Elevated liver enzymes 09/10/2017   History of diabetes mellitus 06/06/2017   Primary osteoarthritis of right hand 06/06/2017   Transaminasemia    TIA (transient ischemic attack) 05/01/2017   Dysphasia 05/01/2017   Chronic pain syndrome 05/01/2017   Confusion    Presence of right artificial knee joint 09/17/2016   H/O total knee replacement, right 08/15/2016   Presence of retained hardware    Memory disorder 08/14/2016   Cerebrovascular disease 08/14/2016   Unilateral primary osteoarthritis, right knee 05/29/2016   Primary osteoarthritis of both feet 05/29/2016   Trochanteric bursitis of both hips 05/25/2016   Neck pain 05/25/2016   Urinary urgency 04/19/2016   Chronic low back pain 01/11/2016   Total knee replacement status 03/23/2015   Heart palpitations 12/14/2013   Generalized osteoarthritis of multiple sites 11/04/2013   Elevated LFTs 03/17/2012   Cerebral infarction ([place]) 10/30/2011   PFO (patent foramen ovale) 10/30/2011   Acquired hypothyroidism    Thrombophlebitis    OSA on CPAP    Fibromyalgia    Status post bariatric surgery 12/07/2010   Vein disorder 11/29/2010   S/P total hysterectomy and bilateral salpingo-oophorectomy 11/29/2010   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   S/P cholecystectomy 11/29/2010   S/P ACL surgery 11/29/2010   Morbid obesity ([place]) 11/10/2010  Past Medical History:  Diagnosis Date   ADD (attention deficit disorder)    Arthritis    "all over"   Cerebral infarction ([place]) 10/30/2011   Cerebrovascular disease 08/14/2016   Chronic back pain    "all over"   Chronic low back pain 91/63/8466   Complication of anesthesia    tends to have hypotension when NPO and post-anesthesia   Constipation    takes stool softener daily    Degenerative disk disease    Degenerative joint disease    DVT (deep venous thrombosis) ([place]) 2014   RLE   Family history of adverse reaction to anesthesia    a family member woke up during surgery; "think it was my mom"   Fibromyalgia    Generalized osteoarthritis of multiple sites 11/04/2013   GERD (gastroesophageal reflux disease)    Headache    Heart palpitations 12/14/2013   resolved   History of blood clots    superficial   Hypoglycemia    Hypothyroid    takes Synthroid daily   Incomplete emptying of bladder    Insomnia    takes Trazodone nightly   Iron deficiency anemia    takes Ferrous Sulfate daily   Joint pain    Joint swelling    knees and ankles   Memory disorder 08/14/2016   Morbid obesity ([place])    Nausea    takes Zofran as needed.Seeing GI doc   Neck pain 05/25/2016   OSA on CPAP    tested more than 5 yrs ago.     Osteoarthritis    Osteopenia    in feet   PFO (patent foramen ovale)    no murmur   Pre-diabetes    Primary osteoarthritis of both feet 05/29/2016   Right bunionectomy August 2017 by Dr. Sharol Given   Scoliosis    Skin abnormality 02/04/2020   raised area on lip   Stroke [facility]) "several"last 2014   right foot weakness; memory issues, black spot right visual field since" (03/23/2015)   Thrombophlebitis    Trochanteric bursitis of both hips 05/25/2016   Unilateral primary osteoarthritis, right knee 05/29/2016   Urinary urgency 04/19/2016   Vein disorder 11/29/2010   varicose veins both legs   Wears glasses     Family History  Problem Relation Age of Onset   Cancer Mother        skin and breast   Myasthenia gravis Mother    Breast cancer Mother    Heart disease Father    Cancer Father    Parkinson's disease Father    Heart disease Sister    Heart attack Sister    Heart disease Brother    Cancer Brother    Diabetes Brother    Stroke Brother    Cancer Maternal Grandfather    Hypothyroidism Daughter    Hypertension Other    Colon cancer  Neg Hx     Past Surgical History:  Procedure Laterality Date   BIOPSY  11/14/2018   Procedure: BIOPSY;  Surgeon: Rogene Houston, MD;  [facility];  Service: Endoscopy;;  esophagusgastric   BONE EXCISION Right 08/29/2017   Procedure: right trapezium excision;  Surgeon: Daryll Brod, MD;  Location: [place];  Service: Orthopedics;  Laterality: Right;   BUNIONECTOMY Right 08/2015   CARDIAC CATHETERIZATION     2008.  "it was fine" (not sure why she had it done, and doesn't know where)   [place] Right 08/29/2017   Procedure: SUSPENSION PLASTY  RIGHT THUMB;  Surgeon: Daryll Brod, MD;  Location: [place];  Service: Orthopedics;  Laterality: Right;   COLONOSCOPY N/A 03/25/2013   Procedure: COLONOSCOPY;  Surgeon: Rogene Houston, MD;  [facility];  Service: Endoscopy;  Laterality: N/A;  930   ERCP  06/02/2020   ESOPHAGOGASTRODUODENOSCOPY     ESOPHAGOGASTRODUODENOSCOPY (EGD) WITH PROPOFOL N/A 11/14/2018   Procedure: ESOPHAGOGASTRODUODENOSCOPY (EGD) WITH PROPOFOL;  Surgeon: Rogene Houston, MD;  [facility];  Service: Endoscopy;  Laterality: N/A;  1:55pm-office moved to 11:00am/pt notified to arrive at 9:30am per [place]     "took fallopian tubes out"   [place] Right 02/12/2020   Procedure: HALLUX INTERPHANGEAL JOINT  FUSION;  Surgeon: Felipa Furnace, DPM;  Location: [place];  Service: Podiatry;  Laterality: Right;   HAMMER TOE SURGERY Right 02/12/2020   Procedure: SECOND AND THIRD HAMMER TOE CORRECTION; CAPSULOTOMY SECOND INTERPHALANGEAL JOINT;  Surgeon: Felipa Furnace, DPM;  Location: [place];  Service: Podiatry;  Laterality: Right;   JOINT REPLACEMENT     bil knee    KNEE ARTHROSCOPY Left    KNEE ARTHROSCOPY W/ ACL RECONSTRUCTION Right yrs ago   "added pins"   LAPAROSCOPIC CHOLECYSTECTOMY  ~ 2001   ROUX-EN-Y GASTRIC BYPASS  11/20/2010   [place]  long   SPINAL CORD STIMULATOR INSERTION N/A 04/18/2017   Procedure: LUMBAR SPINAL CORD STIMULATOR INSERTION;  Surgeon: Clydell Hakim, MD;  Location: [place];  Service: Neurosurgery;  Laterality: N/A;  LUMBAR SPINAL CORD STIMULATOR INSERTION   stomach stent  04/28/2020   TENDON TRANSFER Right 08/29/2017   Procedure: right abductor pollicis longus transfer;  Surgeon: Daryll Brod, MD;  Location: [place];  Service: Orthopedics;  Laterality: Right;   TOTAL KNEE ARTHROPLASTY Left 03/23/2015   Procedure: TOTAL KNEE ARTHROPLASTY;  Surgeon: Newt Minion, MD;  Location: [place];  Service: Orthopedics;  Laterality: Left;   TOTAL KNEE ARTHROPLASTY Right 08/15/2016   Procedure: RIGHT TOTAL KNEE ARTHROPLASTY, REMOVAL ACL SCREWS;  Surgeon: Newt Minion, MD;  Location: [place];  Service: Orthopedics;  Laterality: Right;   TOTAL KNEE ARTHROPLASTY WITH HARDWARE REMOVAL Right    VAGINAL HYSTERECTOMY     tah/bso   VARICOSE VEIN SURGERY Right X 2   WEIL OSTEOTOMY Right 02/12/2020   Procedure: DOUBLE L OSTEOTOMY;  Surgeon: Felipa Furnace, DPM;  Location: [place];  Service: Podiatry;  Laterality: Right;   Social History   Occupational History   Occupation: Disability   Occupation: formerly Therapist, sports, [employer]  Tobacco Use   Smoking status: Former    Packs/day: 0.75    Years: 8.00    Total pack years: 6.00    Types: Cigarettes    Quit date: 12/01/1990    Years since quitting: 30.8    Passive exposure: Past   Smokeless tobacco: Never   Tobacco comments:    quit smoking in the 1990s  Vaping Use   Vaping Use: Never used  Substance and Sexual Activity   Alcohol use: No    Comment: 03/23/2015 "stopped drinking in 2012 w/gastric bypass; drank socially before bypass"   Drug use: No   Sexual activity: Not Currently    Birth control/protection: Surgical

## 2021-10-05 NOTE — Patient Instructions (Signed)
Avoid frequent bending and stooping  No lifting greater than 10 lbs. May use ice or moist heat for pain. Weight loss is of benefit. Best medication for lumbar disc disease is arthritis medications like motrin, celebrex and naprosyn. Exercise is important to improve your indurance and does allow people to function better inspite of back pain.  May bath as needed Pool exercise, removing brace while in pool. Okay to drive.

## 2021-10-06 DIAGNOSIS — D62 Acute posthemorrhagic anemia: Secondary | ICD-10-CM | POA: Diagnosis not present

## 2021-10-06 DIAGNOSIS — N3281 Overactive bladder: Secondary | ICD-10-CM | POA: Diagnosis not present

## 2021-10-06 DIAGNOSIS — F909 Attention-deficit hyperactivity disorder, unspecified type: Secondary | ICD-10-CM | POA: Diagnosis not present

## 2021-10-06 DIAGNOSIS — Z7901 Long term (current) use of anticoagulants: Secondary | ICD-10-CM | POA: Diagnosis not present

## 2021-10-06 DIAGNOSIS — G894 Chronic pain syndrome: Secondary | ICD-10-CM | POA: Diagnosis not present

## 2021-10-06 DIAGNOSIS — M797 Fibromyalgia: Secondary | ICD-10-CM | POA: Diagnosis not present

## 2021-10-06 DIAGNOSIS — G43709 Chronic migraine without aura, not intractable, without status migrainosus: Secondary | ICD-10-CM | POA: Diagnosis not present

## 2021-10-06 DIAGNOSIS — F5101 Primary insomnia: Secondary | ICD-10-CM | POA: Diagnosis not present

## 2021-10-06 DIAGNOSIS — Z8673 Personal history of transient ischemic attack (TIA), and cerebral infarction without residual deficits: Secondary | ICD-10-CM | POA: Diagnosis not present

## 2021-10-06 DIAGNOSIS — M519 Unspecified thoracic, thoracolumbar and lumbosacral intervertebral disc disorder: Secondary | ICD-10-CM | POA: Diagnosis not present

## 2021-10-06 DIAGNOSIS — Z4789 Encounter for other orthopedic aftercare: Secondary | ICD-10-CM | POA: Diagnosis not present

## 2021-10-06 DIAGNOSIS — Z86711 Personal history of pulmonary embolism: Secondary | ICD-10-CM | POA: Diagnosis not present

## 2021-10-06 DIAGNOSIS — M419 Scoliosis, unspecified: Secondary | ICD-10-CM | POA: Diagnosis not present

## 2021-10-06 DIAGNOSIS — Z90710 Acquired absence of both cervix and uterus: Secondary | ICD-10-CM | POA: Diagnosis not present

## 2021-10-06 DIAGNOSIS — K219 Gastro-esophageal reflux disease without esophagitis: Secondary | ICD-10-CM | POA: Diagnosis not present

## 2021-10-06 DIAGNOSIS — G4733 Obstructive sleep apnea (adult) (pediatric): Secondary | ICD-10-CM | POA: Diagnosis not present

## 2021-10-06 DIAGNOSIS — M159 Polyosteoarthritis, unspecified: Secondary | ICD-10-CM | POA: Diagnosis not present

## 2021-10-06 DIAGNOSIS — K5903 Drug induced constipation: Secondary | ICD-10-CM | POA: Diagnosis not present

## 2021-10-06 DIAGNOSIS — R7303 Prediabetes: Secondary | ICD-10-CM | POA: Diagnosis not present

## 2021-10-06 DIAGNOSIS — E039 Hypothyroidism, unspecified: Secondary | ICD-10-CM | POA: Diagnosis not present

## 2021-10-06 DIAGNOSIS — Z96653 Presence of artificial knee joint, bilateral: Secondary | ICD-10-CM | POA: Diagnosis not present

## 2021-10-06 DIAGNOSIS — Z981 Arthrodesis status: Secondary | ICD-10-CM | POA: Diagnosis not present

## 2021-10-06 DIAGNOSIS — M8580 Other specified disorders of bone density and structure, unspecified site: Secondary | ICD-10-CM | POA: Diagnosis not present

## 2021-10-06 NOTE — Op Note (Addendum)
08/21/2021  4:40 PM  PATIENT:  Debbie Pineda  61 y.o. female  MRN: [id number]  OPERATIVE REPORT  PRE-OPERATIVE DIAGNOSIS:  collapsing lumbar scoliosis T10 - L5 with degenerative disc disease, spondylosis, stenosis, failure of spinal cord stimulator, lumbar neurogenic claudication L5-S1  POST-OPERATIVE DIAGNOSIS:  collapsing lumbar scoliosis T10 - L5 with degenerative disc disease, spondylosis, stenosis, failure of spinal cord stimulator, lumbar neurogenic claudication L5-S1  PROCEDURE:  Procedure(s): Removal of the spinal cord stimulator and thoracolumbar fusion T10 to S1 with Transforaminal Lumbar Interbody Fusions left L1-2, L2-3, L3-4 and L4-5, bilateral L5-S1 foramenotomies and left T10-11 foramenotomy this would be done with rods, screws and cages with local bone graft and allograft, Vivigen    SURGEON:  Jessy Oto, MD     ASSISTANT:  Benjiman Core, PA-C  (Present throughout the entire procedure and necessary for completion of procedure in a timely manner)     ANESTHESIA:  General, supplemented with local marcaine 0.5% 1:1 exparel 1.3% total 20cc, Dr. Gifford Shave    COMPLICATIONS:  None.   EBL: 1150CC  CELL SAVER BLOOD RETURNED: 1000CC  DRAINS: Hemovac medium drain right lower lumbar. Foley to SD.    COMPONENTS:  Implant Name Type Inv. Item Serial No. Manufacturer Lot No. LRB No. Used Action  SCREW MOD SD CREO 7.5X50 - [id number] Screw SCREW MOD SD CREO 7.5X50  [employer] N/A 2 Implanted  SCREW SD MOD CREO 6.5X45 - [id number] Screw SCREW SD MOD CREO 6.5X45  [employer] N/A 4 Implanted  SCREW MOD CREO 5.5X40 - [id number] Screw SCREW MOD CREO 5.5X40  [employer] N/A 2 Implanted  GRAFT BONE CANNULA VIVIGEN 3 - [id number] Bone Implant GRAFT BONE CANNULA VIVIGEN 3 [id number] [employer]  N/A 1 Implanted  GRAFT BONE CANNULA VIVIGEN 3 - [id number] Bone Implant GRAFT BONE CANNULA VIVIGEN 3 [phone] [employer]  N/A 1 Implanted  GRAFT  BONE CANNULA VIVIGEN 3 - [id number] Bone Implant GRAFT BONE CANNULA VIVIGEN 3 [phone] [employer]  N/A 1 Implanted  GRAFT BONE CANNULA VIVIGEN 3 - [id number] Bone Implant GRAFT BONE CANNULA VIVIGEN 3 [phone] [employer]  N/A 1 Implanted  GRAFT BONE CANNULA VIVIGEN 3 - [phone] Bone Implant GRAFT BONE CANNULA VIVIGEN 3 [phone] [employer]  N/A 1 Implanted  GRAFT BONE CANNULA VIVIGEN 3 - [id number] Bone Implant GRAFT BONE CANNULA VIVIGEN 3 [phone] [employer]  N/A 1 Implanted  CAGE SABLE 10X30 7-14 15D - [id number] Cage CAGE SABLE 10X30 7-14 15D  [employer] [id number] N/A 1 Implanted  BONE CANC CHIPS 40CC CAN1/2 - [id number] Bone Implant BONE [facility] CHIPS 40CC CAN1/2 [id number] [employer]  N/A 1 Implanted  INTERBODY SABLE 10X26 7-14 15D - [id number] Miscellaneous INTERBODY SABLE 10X26 7-14 15D  [employer] [id number] N/A 1 Implanted  GRAFT BONE CANNULA VIVIGEN 3 - [id number] Bone Implant GRAFT BONE CANNULA VIVIGEN 3 [phone] [facility] [facility]  N/A 1 Implanted  GRAFT BONE CANNULA VIVIGEN 3 - [id number] Bone Implant GRAFT BONE CANNULA VIVIGEN 3 [phone] [facility] [facility]  N/A 1 Implanted  GRAFT BONE CANNULA VIVIGEN 3 - [id number] Bone Implant GRAFT BONE CANNULA VIVIGEN 3 [phone] [facility] [facility]  N/A 1 Implanted  GRAFT BONE CANNULA VIVIGEN 3 - [phone] Bone Implant GRAFT BONE CANNULA VIVIGEN 3 [phone] [employer] [facility]  N/A 1 Implanted  CAGE SABLE 10X26 6-12 8D - [id number] Cage CAGE SABLE 10X26 6-12 8D  [employer] [id number] N/A 1 Implanted  GRAFT BONE CANNULA VIVIGEN 3 - [phone] Bone Implant GRAFT  BONE CANNULA VIVIGEN 3 [phone] [place] 1 Implanted  GRAFT BONE CANNULA VIVIGEN 3 - [phone] Bone Implant GRAFT BONE CANNULA VIVIGEN 3 [phone] [facility]  N/A 1 Implanted  GRAFT BONE CANNULA VIVIGEN 3 - [id number] Bone Implant GRAFT BONE CANNULA VIVIGEN 3 [id number] [facility]  N/A 1 Implanted   CAGE SABLE 10X26 6-12 8D - [id number] Cage CAGE SABLE 10X26 6-12 8D  [employer] [id number] N/A 2 Implanted  ROD CREO MOD 6.5X50 - [id number] Rod ROD CREO MOD 6.5X50  [employer] N/A 6 Implanted  SCREW PA THRD CREO TULIP 5.5X4 - [id number] Head SCREW PA THRD CREO TULIP 5.5X4  [employer] N/A 18 Implanted  SCREW SD MOD CREO 6.5X40 - [id number] Screw SCREW SD MOD CREO 6.5X40  [employer] N/A 2 Implanted  SCREW MOD SD CREO 7.5X40 - [id number] Screw SCREW MOD SD CREO 7.5X40  [employer] N/A 2 Implanted  CAP LOCKING THREADED - [id number] Cap CAP LOCKING THREADED  [employer] N/A 18 Implanted  ROD HEXENDED ALLOY 5.5X500MM - [id number] Rod ROD HEXENDED ALLOY 5.5X500MM  [employer] N/A 2 Implanted  IMPLANT CROSSLINK 32-40 - [id number] Neuro Prosthesis/Implant IMPLANT CROSSLINK 32-40  [employer] N/A 1 Implanted  ROD CREO CROSS SPINAL 24.5-31 - [id number] Rod ROD CREO CROSS SPINAL 24.5-31  [employer] N/A 1 Implanted     INTRA NEUROMONITORING STUDIES: L1     Left 41      Right  42 L2     Left 35      Right  40 L3     Left 20      Right  30 L4     Left 20      Right  16 L5     Left 13      RIght  25 S1     Left 15     Right  18 No intraoperative neuromonitoring changes noted during the entire case including both sensory evoked potentials and motor evoked potentials.     PROCEDURE:    The patient was met in the holding area, and the appropriate T10 to L5-S1 thoracolumbar levels identified and marked with an "X" and my initials. I had discussion with the patient in the preop holding area regarding consent form. Patient understands the rationale for the fusion site at the T10 through S1 segments for multiple level spondylosis and degenerative collapsing scoliosis with left T10-11 and bilateral L5 foramenal stenosis to be treated with foramenotomies and then fusion to the T10 level inorder to bridge the thoracolumbar junction.   The patient was then transported to OR and was placed under general anesthetic without difficulty. The patient received appropriate preoperative antibiotic prophylaxis ancef 2 gm. Intraop neural monitoring of SEP and MEP, screw impedance testing.  Nursing staff inserted a Foley catheter under sterile conditions. The patient was then turned to a prone position using the [place] spine frame. PAS. all pressure points well padded the arms at the side to 90 90. Standard prep with DuraPrep solution draped in the usual manner from the mid dorsal spine to the sacral segment. Iodine Vi-Drape was used and the incision was marked. Time-out procedure was called and correct. Loupe magnification and headlight were used during this portion procedure. The first portion of the case was the removal of the spinal cord stimulator at the T11-T12 level and incision was made. Bipolar electrocautery used only for the  removal of the stimulator. The incision through skin and subcutaneous layes to the spinous processes of T11 and T12 and L1. The spinal cord stimulator leads identified at the subcutaneous fat layer and the leads then used to direct the exposure cranially. A retention suture removed at the interspinous ligament at T11-12 and the epidural electrodes then carefully removed from the spinal canal using longitudinal traction on the spinal cord stimulator leads. These were removed without difficulty. The leads leading to the impulse generator were coiled on themselves and were carefully dissected the then incision made at the previous transverse incision used for placement of the stimulator impulse generator. Incision carried sharply down to the battery or impuse generator and the battery then removed. The leads then divided and pulled out with the battery. The pocket within the subcutaneous tissue then irrigated and the peudomembrane formed about the battery then removed with mettzenbaum scissor dissection. Bleeding controlled  with electrocautery then the incision closed using multiple subcutaneous sutures to close the deep space that was the pouch for the battery the the subcutaneous skin layers approximated with #1 vicryl, more superficial with O and 2-O vicryl sutures then the skin closed with stainless steel staples. The midline incision for the approach to remove the epidural catheters was used for part of the cranial portion of the midline exposure of the spine.  Skin in the midline between D9 and S1 was then infiltrated with local marcaine 0.5% 1:1 exparel 1.3% total of 20 cc used. Incision was then made through the skin and subcutaneous layers down to the patient's lumbodorsal fascia and spinous processes. The incision then carried sharply along the supraspinous ligament and then continuing the lateral aspects of the spinous processes T10, T11, T12,L1,L2 L3, L4 and L5S1. Cobb elevators used to carefully elevate the paralumbar muscles off of the posterior elements using electrocautery carefully drilled bleeding and perform dissection of the muscle tissues of the resecting the facet capsules at T10-T11, T11-T12, T12-L1, L1-2, L2-3, L3-4, L4-5 and  L5-S1. Bleeding controlled using electrocautery monopolar electrocautery.  Viper retractor was used for the lower part of the incision. Cerebellar retractors in the cephalad portion of the incision.   C-arm fluoroscopy was then brought into the field and using C-arm fluoroscopy then a hole made into the medial aspect of the pedicle of L3 observed in the pedicle using C arm at the 5 oclock position on the left L3 pedicle nerve probe initial entry was determined on fluoroscopy to be good position alignment so that a 4.78m tap was passed to 50 mm within the left L3 pedicle to a depth of nearly 50 mm observed on C-arm fluoroscopy to be beyond the midpoint of the lumbar vertebra and then position alignment within the left L3 pedicle this was then removed and the pedicle channel probed  demonstrating patency no sign of rupture the cortex of the pedicle. Tapping with a 4.5 mm screw tap then 5.5 mm tap then 6.5 mm x 50 mm screw shank was placed on the left side at the L3 level. C-arm fluoroscopy was then brought into the field and using C-arm fluoroscopy then a hole made into the posterior medial aspect of the pedicle of right L3 observed in the pedicle using ball tipped nerve hook and hockey stick nerve probe initial entry was determined on fluoroscopy to be good position alignment so that 4.542mtap was then used to tap the right L3 pedicle to a depth of nearly 50 mm observed on C-arm fluoroscopy to be beyond  the midpoint of the lumbar vertebra and then position alignment within the right L3 pedicle this was then removed and the pedicle channel probed demonstrating patency no sign of rupture the cortex of the pedicle. Tapping with a 5.5 mm screw tap then 6.5 mm x 50 mm screw with saddle was placed right L3 pedicle. C-arm fluoroscopy was then brought into the field and using C-arm fluoroscopy then a hole made into the posterior and medial aspect of the left pedicle of L4 observed in the pedicle using ball tipped nerve hook and hockey stick nerve probe initial entry was determined on fluoroscopy to be good position alignment so that a 4.5 mm tap was then used to tap the left L4 pedicle to a depth of nearly 50 mm observed on C-arm fluoroscopy to be beyond the posterior one third of the lumbar vertebra and good position alignment within the left L4 pedicle this was then removed and the pedicle channel probed demonstrating patency no sign of rupture the cortex of the pedicle. Tapping with a 5.5 mm screw tap then 6.781m x 50 mm screw was placed on the left side at the L4 level. The pedicle channel of L4 on the left probed demonstrating patency no sign of rupture the cortex of the pedicle. Viper screw for fixation of this level was measured as 6.5 mm x 50 mm screw so  was placed on the left side at the L4  level. C-arm fluoroscopy was then brought into the field and using C-arm fluoroscopy then a hole made into the posterior and medial aspect of the right pedicle of L4 observed in the pedicle using ball tipped nerve hook and hockey stick nerve probe initial entry was determined on fluoroscopy to be good position alignment so that a 4.567mtap was then used to tap the right L4 pedicle to a depth of nearly 50 mm observed on C-arm fluoroscopy to be beyond the posterior one third of the lumbar vertebra and good position alignment within the right L4 pedicle this was then removed and the pedicle channel probed demonstrating patency no sign of rupture the cortex of the pedicle. Tapping with a 5.5 mm screw then 6.5 mm x 50 mm screw was placed on the right side at the L5 level. The pedicle channel of L4 on the right probed demonstrating patency no sign of rupture the cortex of the pedicle. Viper screw for fixation of this level was measured as 6.5 mm x 50 mm screw placed on the right side pedicle of L4. C-arm fluoroscopy was then brought into the field and using C-arm fluoroscopy then a hole made into the medial aspect of the pedicle of L5 observed in the pedicle using C arm at the 5 oclock position on the left L5 pedicle nerve probe initial entry was determined on fluoroscopy to be good position alignment so that a 4.81m76map was passed to 50 mm within the left L5 pedicle to a depth of nearly 50 mm observed on C-arm fluoroscopy to be beyond the midpoint of the lumbar vertebra and then position alignment within the left L5 pedicle this was then removed and the pedicle channel probed demonstrating patency no sign of rupture the cortex of the pedicle. Tapping with a 4.5 mm screw tap then 6.5 mm tap then 7.5 mm x 50 mm screw shank was placed on the left side at the L5 level. C-arm fluoroscopy was then brought into the field and using C-arm fluoroscopy then a hole made into the posterior medial  aspect of the pedicle of right L5  observed in the pedicle using ball tipped nerve hook and hockey stick nerve probe initial entry was determined on fluoroscopy to be good position alignment so that 4.6m tap was then used to tap the right L5 pedicle to a depth of nearly 50 mm observed on C-arm fluoroscopy to be beyond the midpoint of the lumbar vertebra and then position alignment within the right L5 pedicle this was then removed and the pedicle channel probed demonstrating patency no sign of rupture the cortex of the pedicle. Tapping with a 6.5 mm screw tap then 7.5 mm x 50 mm screw with saddle was placed right L5 pedicle. C-arm fluoroscopy was then brought into the field and using C-arm fluoroscopy then a hole made into the posterior and medial aspect of the left pedicle of S1 observed in the pedicle using ball tipped nerve hook and hockey stick nerve probe initial entry was determined on fluoroscopy to be good position alignment so that a 4.5 mm tap was then used to tap the left S1 pedicle to a depth of nearly 40 mm observed on C-arm fluoroscopy to be beyond the posterior one third of the lumbar vertebra and good position alignment within the left S1 pedicle this was then removed and the pedicle channel probed demonstrating patency no sign of rupture the cortex of the pedicle. Tapping with a 5.5 mm screw tap then 7.522mx 40 mm screw was placed on the left side at the S1 level. The pedicle channel of S1 on the left probed demonstrating patency no sign of rupture the cortex of the pedicle. Viper screw for fixation of this level was measured as 7.5 mm x 40 mm screw so  was placed on the left side at the S1 level. C-arm fluoroscopy was then brought into the field and using C-arm fluoroscopy then a hole made into the posterior and medial aspect of the right pedicle of S1 observed in the pedicle using ball tipped nerve hook and hockey stick nerve probe initial entry was determined on fluoroscopy to be good position alignment so that a 4.65m38map was  then used to tap the right S1 pedicle to a depth of nearly 40 mm observed on C-arm fluoroscopy to be beyond the posterior one third of the lumbar vertebra and good position alignment within the right S1 pedicle this was then removed and the pedicle channel probed demonstrating patency no sign of rupture the cortex of the pedicle. Tapping with a 5.5 mm screw then 7.5 mm x 40 mm screw was placed on the right side at the S1 level. The pedicle channel of S1 on the right probed demonstrating patency no sign of rupture the cortex of the pedicle. [employer] screw for fixation of this level was measured as 7.5 mm x 40 mm screw placed on the right side pedicle of S1. C-arm fluoroscopy was then brought into the field and using C-arm fluoroscopy then a hole made into the lateral aspect of the left pedicle of L2 observed in the pedicle using ball-tipped nerve hook and hockey stick nerve probe initial entry was determined on fluoroscopy to be good position alignment so that a ball handled probe was then used to probe the left L2 pedicle to a depth of nearly 50 mm observed on C-arm fluoroscopy to be beyond the midpoint of the lumbar vertebra and then position alignment within the left L2 pedicle this was then removed and the pedicle channel probed demonstrating patency no sign  of rupture the cortex of the pedicle. Tapping with a 5.5 mm screw then 6.5 mm x 50 mm screw shank was placed on the left side at the L2 level. C-arm fluoroscopy was then brought into the field and using C-arm fluoroscopy then a hole made into the middle of the pedicle of right L2 observed in the pedicle using ball tipped nerve hook and hockey stick nerve probe initial entry was determined on fluoroscopy to be good position alignment so that a ball handled probe was then used to probe the right L2 pedicle opening to a depth of nearly 50 mm observed on C-arm fluoroscopy to be beyond the midpoint of the lumbar vertebra and then position alignment within the  right L2 pedicle this was then removed and the pedicle channel probed demonstrating patency no sign of rupture the cortex of the pedicle. Tapping up to a 5.5 mm screw tap then 6.5 mm x 50 mm screw was placed on the right side at the L2 level. C-arm fluoroscopy was then brought into the field and using C-arm fluoroscopy then a hole made into the lateral aspect of the left pedicle of L1 observed in the pedicle using ball tipped nerve hook and hockey stick nerve probe initial entry was determined on fluoroscopy to be good position alignment so that a ball handled probe was then used to probe the left L1 pedicle to a depth of nearly 45 mm observed on C-arm fluoroscopy to be well aligned within the left L1 pedicle, the pedicle channel probed demonstrating patency no sign of rupture the cortex of the pedicle. Tapping with a 4.5 mm screw then a 5.5 mm tap, then a  6.5 mm x 45 mm screw shank was placed  on the left side at the L1 level. C-arm fluoroscopy was used to localize the hole made in the lateral aspect of the pedicle of L1 on the right localizing the pedicle within the spinal canal with nerve hook and hockey-stick nerve probe carefully passed down the center of the L1 pedicle to a depth of nearly 45 mm. Observed on C-arm fluoroscopy to be in good position alignment channel was probed with a ball-tipped probe ensure patency no sign of cortical disruption. Following tapping with a 4.5 mm and then a 5.5 mm tap and a 6.5 x 45 mm screw was placed  on the right side at the L1 level. Using C-arm fluoroscopy then a hole made into the centrall aspect of the left pedicle of T12 observed in the pedicle using ball-tipped nerve hook and hockey stick nerve probe initial entry was determined on fluoroscopy to be good position alignment so that a ball handled probe was then used to probe the left T12 pedicle to a depth of nearly 45 mm observed on C-arm fluoroscopy to be beyond the midpoint of the lumbar vertebra and then position  alignment within the left T12 pedicle this was then removed and the pedicle channel probed demonstrating patency no sign of rupture the cortex of the pedicle. Tapping with a 5.5 mm screw then 6.5 mm x 45 mm screw was placed on the left side at the T12 level. Using C-arm fluoroscopy then a hole made into the middle of the pedicle of right T12 observed in the pedicle using ball tipped nerve hook and hockey stick nerve probe initial entry was determined on fluoroscopy to be good position alignment so that a ball handled probe was then used to probe the right T12 pedicle opening to a depth of  nearly 45 mm observed on C-arm fluoroscopy to be beyond the midpoint of the lumbar vertebra and then position alignment within the right T12 pedicle this was then removed and the pedicle channel probed demonstrating patency no sign of rupture the cortex of the pedicle. Tapping with a 4.5 mm screw tap and then a 5.5 tap then a 6.5 mm x 45 mm screw was placed on the right side at the T12 level. Using C-arm fluoroscopy then a hole made into the lateral aspect of the left pedicle of T11 observed in the pedicle using ball tipped nerve hook and hockey stick nerve probe initial entry was determined on fluoroscopy to be good position alignment so that a ball handled probe was then used to probe the left T11 pedicle to a depth of nearly 45 mm observed on C-arm fluoroscopy to be well aligned within the left T11 pedicle, the pedicle channel probed demonstrating patency no sign of rupture the cortex of the pedicle. Tapping with a 4.5 mm screw tap and then a 5.5 mm tap then a 6.5 mm x 45 mm screw was placed  on the left side at the T11 level. C-arm fluoroscopy was used to localize the hole made in the central aspect of the pedicle of T11 on the right localizing the pedicle within the opening with a ball tipped probe and ball tipped probe carefully passed down the center of the T11 pedicle to a depth of nearly 45 mm. Observed on C-arm  fluoroscopy to be in good position alignment channel was probed with a ball-tipped probe ensure patency no sign of cortical disruption. Tapping with a 4.5 mm screw tap and then a 5.5 mm tap then a 6.5 mm x 45 mm screw was placed  on the right side at the T11 level. Using C-arm fluoroscopy then a hole made into the centrall aspect of the left pedicle of T10 observed in the pedicle using ball-tipped nerve hook and hockey stick nerve probe initial entry was determined on fluoroscopy to be good position alignment so that a ball handled probe was then used to probe the left T10 pedicle to a depth of nearly 40 mm observed on C-arm fluoroscopy to be beyond the midpoint of the lumbar vertebra and then position alignment within the left T10 pedicle this was then removed and the pedicle channel probed demonstrating patency no sign of rupture the cortex of the pedicle. Tapping with a 4.5 mm screw tap then a 5.5 mm x 40 mm screw was placed on the left side at the T10 level. Using C-arm fluoroscopy then a hole made into the middle of the pedicle of right T10 observed in the pedicle using ball tipped nerve hook and hockey stick nerve probe initial entry was determined on fluoroscopy to be good position alignment so that a ball handled probe was then used to probe the right T10 pedicle opening to a depth of nearly 40 mm observed on C-arm fluoroscopy to be beyond the midpoint of the lumbar vertebra and then position alignment within the right T10 pedicle this was then removed and the pedicle channel probed demonstrating patency no sign of rupture the cortex of the pedicle. Tapping with a 4.5 mm screw tap and  then a 5.5 mm x 40 mm screw was placed on the right side at the T10 level. Intraoperative neuromonitoring was performed throughout the case and resistance testing then carried out of each of the pedicle screws the resistance value for each screw listed above.  Left  lamina and lateral inferior spinous processes of L1, L2, L3  and L4 were then resected down to the base the lamina at each segment the lower 50% of the spinous process of L1, L2, L3 and L4 was resected and Leksell rongeur used to resect inferior aspect of the lamina on the left side at the L1, L2, L3 and L4 level and partially on the right side at at each level The left side decompression was carried out but complete facetectomies were perform on the left at L1-2, L2-3, L3-4 and L4-5 to provide for exposure of the left sideL1-2, L2-3, L3-4 and L4-5 neuroforamen for ease of placement of TLIFs (transforaminal lumbar interbody fusion) at each level inferior portions of the lamina and pars were also resected first beginning with the Leksell rongeur and osteotomes and then resecting using 2 and 3 mm Kerrison. Continued laminectomy was carried out resecting the inferior central portions of the lamina of L1, L2, L3,  L4 and L5 performing foraminotomies on the bilaterally L5-S1 level. The inferior articular process L1,L2, L3 and L4 were resected on the left side. The L5 nerve root identified bilaterally and the medial aspect of the L5 pedicle. Left superior articular process of L2, L3,  L4 and L5 was then resected from the left side further decompressing the left L1, L2, L3, L4 and L5 nerve and providing for exposure of the area just superior to the L2, L3, L4 and L5 pedicles for a placement of the four interbody cages. 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 Loupe magnification and headlight were used during this portion procedure. OR microscope sterily draped and brought into the surgical field for the latter portions of the left sided Facetectomies and the TLIF portion of the case.  Attention then turned to placement of the transforaminal lumbar interbody fusion cages. Using a Penfield 4 the right lateral aspect of the thecal sac  at the L4-5 disc space was carefully freed up The thecal sac could then easily be retracted in the posterior lateral aspect of the L4-5 disc was exposed 15 blade scalpel used to incise the posterolateral disc and an osteotome used to resect a small portion of bone off the superior aspect of the posterior superior vertebral body of L5 in order to ease the entry into the L4-5 disc space. A  69m kerrison rongeur was then able to be introduced in the disc space debrided it was quite narrow. 7 mm dilator was used to dialate the L4-5 disc space on the left side attempts were made to dilate further the 8 mm were successful and using small curettes and the disc space was debrided a minimal degenerative disc present in the endplates debrided to bleeding endplate bone. Shavers were inserted to trial the intervertebral disc space. A 10 mm x 347mGlobus adjustable Sable 15 degree 7-1487mage was carefully packed with morcellized bone graft and the been harvested from previous laminotomies.The cage was then inserted with the articulating insertion handle.The cage then deployed using the long screw driver to 14 mm. The cage then packed with vivigen.  Additional bone graft was then packed into the intervertebral disc space, also vivigen and cancelous bone. Bleeding controlled using bipolar electrocautery thrombin soaked gel cottonoids. Then turned to the left L3-4 level similarly the exposure the posterior lateral aspect this was carried out using a Penfield 4  bipolar electrocautery to control small bleeders present. Derricho retractor used to retract the thecal sac and L3 nerve root a 15 blade scalpel was used to incise posterior lateral aspect of the left L3-4 disc the disc space at this level showed a rather severe narrowing posteriorly was more open anteriorly so that an osteotome again was used to resect a small portion the posterior superior lip of the vertebral body at L4  in order to gain ease of access into the left L3-4  disc space. The space was debrided of degenerative disc material using pituitary along root the entire disc space was then debrided of degenerative disc material using pituitary rongeurs curettage down to bleeding bone endplates. 65m and 8 mm shavers were used to debride the disc space and pituitary ronguers used to remove the loosened debris. This space was then carefully assess using spacers  a 9 mm trial cage provided the best fit, the Globus adjustable Sable 15 degree 7-14 lordotic cage 168mx 26 mm cage was chosen so that the permanent adjustable Globus sable lordotic cage packed with local bone graft  and vivigen and was placed into the intervertebral disc space. The cage then deployed to 14 mm using the long screw driver. The cage then packed with vivigen.The posterior intervertebral disc space was then packed with autogenous local bone graft that been harvested from the central laminectomy in addition to vivigen and cancellous allograft. Bleeding controlled using bipolar electrocautery.  The left inferior articular process of L1and L2 were resected in order to provide for exposure of the left side L1-2 and L2-3 neuroforamen for ease of placement of TLIFs (transforaminal lumbar interbody fusion) essential portions of the lamina were also resected first beginning with the Leksell rongeur and then resecting using 2 and 3 mm Kerrison the central and inferior right portions of the lamina of L1 and L2 performing foraminotomies on the left side at the L3 level. The inferior articular process L1 and L2 were resected on the left side. The L3 nerve root identified bilaterally and the medial aspect of the L3 pedicle. Superior articular process of L3 was then resected from the left side further decompressing the left L3 nerve and providing for exposure of the area just superior to the L3 pedicle for a placement of cage. A disc bulge was found on the left side at the L2-3 level and this was resected. The OR microscope  was draped sterilely and brought into the field to allow for freeing up of the left side of the thecal sac at the L2-3 Level elevating the L3 nerve root away from the medial left L3 pedicle, exposing the left L2-3 disc above the left L3 pedicle, Incising the disc and then resecting the disc with micropituitary and then pituitary with teeth.The right side recut resecting the superior articular process of L3 overlying the L2 nerve root as it exited at the L2-3 level decompressing the lateral recess along the medial aspect of the pedicle of L3 and resecting the superior to the process of L3 and decompressing the right L2 neuroforamen.Posterior upper vertebral body of L3 ventral to the right L3 nerve root. The posterior longitudinal ligament was incised and the disc herniation material resected with nerve hook and micropituitary. A blunt nerve hook used to explore the spinal canal and ensure Both the left L3 and L2 nerve roots were well decompressed.At the L2 level similarly lateral recess and the neuroforamen were resected decompressing the L1 and L2 nerve roots and foraminotomies was widely  performed over the left L1 nerve and L2 nerve roots.   Attention then turned to placement of the left transforaminal lumbar interbody fusion cages. Using a Penfield 4 the lateral aspect of the thecal sac and the inferior aspect of the L3 nerve root on the right side at the L2-3 level was carefully drilled. The thecal sac could then easily be retracted in the posterior lateral aspect of the L2-3 disc was exposed 15 blade scalpel used to incise the osteotome used to resect a small portion of bone off the superior aspect of the posterior superior vertebral body of L2 in order to ease the entry into the L2-3 disc space. A pituitary rongeur was then able to be introduced in the disc space debrided it of degenerative disc material. 7 mm dilator was used to dialate  the L2-3 disc space on the right side attempts were made to dilate  further in increments to 9 mm successfully and using small curettes and the disc space was debrided a minimal degenerative disc present in the endplates debrided to bleeding endplate bone.Trial cage placement within the disc place could only allow for an 9 mm lordotic 8 degree Sable adjustable 6-40m cage. A Adjustable Sable cage was carefully packed with Vivigen and local bone graft that been harvested from previous laminotomies additional bone graft local morselized, cancellous allograft chips and vivigen was then packed into the intervertebral disc space using the 8 mm trial to impacted the graft multiple times. With this then a 145mx 26 mm 6-1261m degree adjustable Sable cage was introduced into the disc space on the left side in the correct degree convergence and then impacted then subset beneath the posterior aspect of the disc space by about 3 or 4 mm. The cage the elevated using the long screw drive. Then packing of the cage carried out with the sleeve. Bleeding controlled using bipolar electrocautery thrombin soaked gel cottonoids. With the right sided decompression the right L2 pedicle was preserved. Then turned to the left L1-2 level similarly the exposure the posterior lateral aspect this was carried out using a Penfield 4 bipolar electrocautery to control small bleeders present. Derricho retractor used to retract the thecal sac and nerve root, a 15 blade scalpel was used to incise posterior lateral aspect of the disc at the L1-2 disc space.The space was debrided of degenerative disc material using pituitary along root the entire disc space was then debrided of degenerative disc material using pituitary rongeurs curettage down to bleeding bone endplates. Residual disc was resected using pituitary. This space was then carefully sounded to a 9 mm cage trial provided the best fit the 10 mm x [id number] adjustable Sable 8 degree 6-12mm85me was chosen. The intervertebral disc space was then packed with  autogenous local bone graft that been harvested from the central laminectomy, cancellous allograft chips and vivigen and the trial was used to pack the graft using an 8mm t10ml cage. This provided excellent bone graft within the intervertebral disc space at L1-2 so that the permanent adjustable Globus Sable cage was then packed with local bone graft and vivigen  placed into the intervertebral disc space and impacted into place in the correct degree of convergence. The cage then deployed using the long screw driver to 11mm. 61YWcage then packed with vivigen. Bleeding controlled using bipolar electrocautery. The Sable [place] subset beneath the posterior aspect of the about 3-4 mm .    Bleeding was hemostasis attention was turned to the placement  of the rods and Rod sleeves. The left screw shanks then had tulips placed to allow for the placement of the fixation rods. A left T10-T11 partial hemilaminectomy was performed with the OR microscope resecting the left T10-11 medial 50 % of the facet and performing a wide Left T10-11 foramenotomy. The facets bilateral T10-11, T11-12, T12-L1, L1-2, L2-3, L3-4 and L4-5 were decorticated with high speed burr.  The 454m rod was cut then contoured using a template on the left side and  placed into the pedicle screws on the left extending from L5-S1 into the left L4, L3, L2 and L1 pedicle fastener extending into the fasteners at T12, T11 and T10 and each of the caps carefully placed loosely tightened. Attention turned to the right side were similarly screws were carefully adjusted to allow for a better pattern screws to allow for placement of fixation rod template for the rod was then taken and a cut quarter inch titanium rod was placed. The rod placed into the right screw fasteners L2- to L5-S1 level and reduced into the right  L1, T12, T11 and T10 pedicle fasteners. The fastener caps were then placed. Both rod connector sleeve screws were tightened to 80 foot lbs. The right  L1 rod fastener cap was then tightened 85 pounds similarly on at the right side disc space at L1-L3 screw fasteners were compressed  to compress the L2-3 disc and L1-2 disc and the respective TLIFs.  Posteriorly and the L1 fastener cap was torqued to 85 foot lbs, the right side between L1 and T12 then compressed by loosening the cap at the T12 level, using the compressor and tightening the right T12 cap to 85 foot lbs.  Similarly this was done on the left side at L1-2, L2-3 and T12-L1 sleeve screws were compressed and tightened 85 pounds.The areas between T10-T11 and T11-T12 were secured by tightening with the torque driver without distraction or compression. A single transverse loading rod was placed at the L2-3 level. Measuring and placing an A-2 transverse loading rod and tightening the attaching nuts to 80 foot lbs.  Irrigation was carried out with copious amounts of saline solution this was done throughout the case. Cell Saver was used during the case. Total cell saver returned blood was 1000 cc. Permanent C-arm images were obtained in AP and lateral planes. Remaining local bone graft was then applied along the right side from T10to T11 and then bilateral posterior  through L1 and right lateral posterior region extending from L2  through S1 posterior facets following decortication of the facets and along the right T10-11 interlaminar area posteriorly. Gelfoam was then removed. The entire incision then layered with vancomycin powder 1 gram. The lumbodorsal musculature carefully exam debrided of any devitalized tissue following removal of Vicryl retractors were the bleeders were controlled using electrocautery and the area dorsal lumbar muscle were then approximated in the midline with interrupted #1 Vicryl sutures loose the dorsal fascia was reattached to the spinous process of T10, T11, T12, L1 and L2 to superiorly and  inferiorly this was done with #1 Vicryl sutures. The para lumbar muscle approximated over  the lower incision site with #1 vicryl A Hemovac drain was placed deep to the lumbodorsal fascia exiting over the right lower lumbar spine. The subcutaneous layers then approximated over the drain using interrupted 0 Vicryl sutures and 2-0 Vicryl sutures. Skin was closed with stainless steel staples then MedPlex bandage. All instrument and sponge counts were correct. The patient was then returned to a  supine position on her bed reactivated extubated and returned to the recovery room in satisfactory condition. Intraoperative neuromonitoring showed no change in lower extremities SEP and MEP throughout the case.     Benjiman Core, PA-C perform the duties of assistant surgeon during this case. He was present from the beginning of the case to the end of the case assisting in transfer the patient from his stretcher to the OR table and back to the stretcher at the end of the case. Assisted in careful retraction and suction of the laminectomy site delicate neural structures operating under the operating room microscope. He performed closure of the incision from the fascia to the skin applying the dressing.    [name]   10/06/2021,Marixa Mellott Louanne Skye

## 2021-10-10 NOTE — Progress Notes (Unsigned)
Cardiology Office Note:    Date:  10/11/2021   ID:  Debbie Pineda, DOB 1960/12/14, MRN [id number]  PCP:  [name]  [place] Providers Cardiologist:  Jenkins Rouge, MD     Referring MD: Jake Samples, [place]*   Chief Complaint:  Palpitations    Patient Profile: Factor V Leiden mutation Hx of CVA Chronic anticoagulation (Xarelto) Patent Foramen Ovale noted on echocardiogram at [facility] in 2013 Echocardiogram 12/21: EF 70-75, +bubble study, mod PFO No closure due to no events on anticoagulation  Fibromyalgia  GERD  Hypothyroidism  Obesity  Iron deficiency anemia OSA  Pre-diabetes   Prior CV Studies: ECHOCARDIOGRAM 01/18/20 EF 70-75, no RWMA, positive bubble study suggestive of interatrial shunt, moderate sized PFO, normal RVSF, mild MR, AV sclerosis without stenosis  ECHO COMPLETE WO IMAGING ENHANCING AGENT 05/02/2017 Study Conclusions Left ventricle: The cavity size was normal. Wall thickness was normal. Systolic function was normal. The estimated ejection fraction was in the range of 60% to 65%.    CAROTID US 05/01/17 No ICA stenosis   History of Present Illness:   Debbie Pineda is a 61 y.o. female with the above problem list.  She was last seen by Dr. Johnsie Cancel in [place] in Dec 22.  She returns for evaluation of irregular heart beat.  She notes that she has had irregularity on pulse oximetry and BP checks over the past 1 month. She has not had rapid palpitations. She can feel some palpitations during PT. She had extensive back surgery in July with multilevel fusion. She has been slowly progressing but still gets tired easily. She has not had chest pain, shortness of breath, syncope, leg edema. She has noted some shortness of breath with lying in bed but only sleeps on 1 pillow.         Past Medical History:  Diagnosis Date   ADD (attention deficit disorder)    Arthritis    "all over"   Cerebral infarction ([place]) 10/30/2011    Cerebrovascular disease 08/14/2016   Chronic back pain    "all over"   Chronic low back pain 40/98/1191   Complication of anesthesia    tends to have hypotension when NPO and post-anesthesia   Constipation    takes stool softener daily   Degenerative disk disease    Degenerative joint disease    DVT (deep venous thrombosis) ([place]) 2014   RLE   Family history of adverse reaction to anesthesia    a family member woke up during surgery; "think it was my mom"   Fibromyalgia    Generalized osteoarthritis of multiple sites 11/04/2013   GERD (gastroesophageal reflux disease)    Headache    Heart palpitations 12/14/2013   resolved   History of blood clots    superficial   Hypoglycemia    Hypothyroid    takes Synthroid daily   Incomplete emptying of bladder    Insomnia    takes Trazodone nightly   Iron deficiency anemia    takes Ferrous Sulfate daily   Joint pain    Joint swelling    knees and ankles   Memory disorder 08/14/2016   Morbid obesity ([place])    Nausea    takes Zofran as needed.Seeing GI doc   Neck pain 05/25/2016   OSA on CPAP    tested more than 5 yrs ago.     Osteoarthritis    Osteopenia    in feet   PFO (patent foramen ovale)  no murmur   Pre-diabetes    Primary osteoarthritis of both feet 05/29/2016   Right bunionectomy August 2017 by Dr. Sharol Given   Scoliosis    Skin abnormality 02/04/2020   raised area on lip   Stroke [facility]) "several"last 2014   right foot weakness; memory issues, black spot right visual field since" (03/23/2015)   Thrombophlebitis    Trochanteric bursitis of both hips 05/25/2016   Unilateral primary osteoarthritis, right knee 05/29/2016   Urinary urgency 04/19/2016   Vein disorder 11/29/2010   varicose veins both legs   Wears glasses    Current Medications: Current Meds  Medication Sig   baclofen (LIORESAL) 10 MG tablet Take 1 tablet (10 mg total) by mouth 3 (three) times daily.   Cholecalciferol (VITAMIN D3) 125 MCG (5000 UT) TABS  Take 5,000 Units by mouth daily.   Cyanocobalamin (VITAMIN B-12) 5000 MCG SUBL Place 5,000 mcg under the tongue daily.    diclofenac sodium (VOLTAREN) 1 % GEL Apply 4 g topically 4 (four) times daily as needed (PAIN).   diphenhydrAMINE (BENADRYL) 50 MG tablet Take 1 tablet (50 mg total) by mouth every 8 (eight) hours as needed for itching.   donepezil (ARICEPT) 10 MG tablet Take 1 tablet (10 mg total) by mouth at bedtime.   DULoxetine (CYMBALTA) 60 MG capsule Take 1 capsule (60 mg total) by mouth 2 (two) times daily.   famotidine (PEPCID) 40 MG tablet Take 1 tablet (40 mg total) by mouth at bedtime.   ferrous gluconate (FERGON) 324 MG tablet Take 1 tablet (324 mg total) by mouth 2 (two) times daily with a meal.   folic acid (FOLVITE) 222 MCG tablet Take 400 mcg by mouth daily.   Galcanezumab-gnlm (EMGALITY) 120 MG/ML SOSY INJECT 120 MG INTO THE SKIN EVERY 30 DAYS   Krill Oil 350 MG CAPS Take 350 mg by mouth at bedtime.    levothyroxine (SYNTHROID) 125 MCG tablet Take 1 tablet (125 mcg total) by mouth daily before breakfast.   Methylfol-Methylcob-Acetylcyst (METAFOLBIC PLUS) 6-2-600 MG TABS Take 1 tablet by mouth daily.   methylphenidate (CONCERTA) 27 MG PO CR tablet Take 1 tablet (27 mg total) by mouth 2 (two) times daily. 10 am & 1400   metoCLOPramide (REGLAN) 5 MG tablet Take 1 tablet (5 mg total) by mouth every 8 (eight) hours as needed for nausea.   miconazole (ZEASORB-AF) 2 % powder Every shift as needed. Apply to red areas in folds of skin.   mirabegron ER (MYRBETRIQ) 50 MG TB24 tablet Take 1 tablet (50 mg total) by mouth daily.   Multiple Minerals-Vitamins (CAL-MAG-ZINC-D PO) Take 3 tablets by mouth daily. 333 mg-133 unit -133 mg-5 mg   Multiple Vitamins-Minerals (ONE-A-DAY WOMENS PETITES PO) Take 1 tablet by mouth 2 (two) times daily.   naloxegol oxalate (MOVANTIK) 25 MG TABS tablet Take 1 tablet (25 mg total) by mouth daily.   NARCAN 4 MG/0.1ML LIQD nasal spray kit Place 0.1 sprays (0.4  mg total) into the nose 2 (two) times daily as needed.   NUCYNTA ER 100 MG 12 hr tablet SMARTSIG:1 Tablet(s) By Mouth Every 12 Hours   omeprazole (PRILOSEC) 40 MG capsule Take one capsule by mouth daily before breaksfast.   ondansetron (ZOFRAN-ODT) 4 MG disintegrating tablet Take 1 tablet (4 mg total) by mouth every 8 (eight) hours as needed for nausea or vomiting.   oxyCODONE-acetaminophen (PERCOCET) 10-325 MG tablet Take 1 tablet by mouth every 6 (six) hours as needed for pain (post operative pain control due  to thoracolumbar fusion surgery).   polyethylene glycol (MIRALAX / GLYCOLAX) 17 g packet Take 17 g by mouth daily as needed for mild constipation or moderate constipation.   Rimegepant Sulfate (NURTEC) 75 MG TBDP Take 75 mg by mouth daily as needed.   rivaroxaban (XARELTO) 20 MG TABS tablet Take 1 tablet (20 mg total) by mouth daily at 6 PM.   tiZANidine (ZANAFLEX) 4 MG tablet Take 1 tablet (4 mg total) by mouth every 6 (six) hours as needed for muscle spasms.   topiramate (TOPAMAX) 100 MG tablet Take 1 tablet (100 mg total) by mouth 2 (two) times daily.   traZODone (DESYREL) 100 MG tablet Take 0.5 tablets (50 mg total) by mouth at bedtime.   trospium (SANCTURA) 20 MG tablet Take 1 tablet (20 mg total) by mouth 2 (two) times daily.   vitamin C (ASCORBIC ACID) 500 MG tablet Take 500 mg by mouth daily.   Current Facility-Administered Medications for the 10/11/21 encounter (Office Visit) with Richardson Dopp T, PA-C  Medication   linaclotide (LINZESS) capsule 290 mcg    Allergies:   Lyrica [pregabalin], Belsomra [suvorexant], Morphine and related, Sulfamethoxazole-trimethoprim, and Tape   Social History   Tobacco Use   Smoking status: Former    Packs/day: 0.75    Years: 8.00    Total pack years: 6.00    Types: Cigarettes    Quit date: 12/01/1990    Years since quitting: 30.8    Passive exposure: Past   Smokeless tobacco: Never   Tobacco comments:    quit smoking in the 1990s  Vaping  Use   Vaping Use: Never used  Substance Use Topics   Alcohol use: No    Comment: 03/23/2015 "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 There is no history of Colon cancer.  Review of Systems  Constitutional: Negative for fever.  Respiratory:  Negative for cough.   Gastrointestinal:  Positive for constipation. Negative for diarrhea, hematochezia and vomiting.  Genitourinary:  Negative for hematuria.     EKGs/Labs/Other Test Reviewed:    EKG:  EKG is   ordered today.  The ekg ordered today demonstrates NSR, HR 73, leftward axis, PRWP, QTc 427, no change from prior EKG; frequent PVCs  Recent Labs: 09/06/2021: ALT 13; BUN 10; Creatinine, Ser 0.95; Hemoglobin 12.9; Platelets 365; Potassium 3.4; Sodium 136   Recent Lipid Panel No results for input(s): "CHOL", "TRIG", "HDL", "VLDL", "LDLCALC", "LDLDIRECT" in the last 8760 hours.   Risk Assessment/Calculations/Metrics:              Physical Exam:    VS:  BP 102/80   Pulse 80   Ht _0  (1.651 m)   Wt 251 lb 12.8 oz (114.2 kg)   SpO2 98%   BMI 41.90 kg/m     Wt Readings from Last 3 Encounters:  10/11/21 251 lb 12.8 oz (114.2 kg)  10/05/21 240 lb 3.2 oz (109 kg)  09/15/21 240 lb 3.2 oz (109 kg)    Constitutional:      Appearance: Healthy appearance. Not in distress.  Neck:  Vascular: JVD normal.  Pulmonary:     Effort: Pulmonary effort is normal.     Breath sounds: No wheezing. No rales.  Cardiovascular:     Normal rate. Irregular rhythm. Normal S1. Normal S2.       Murmurs: There is no murmur.  Edema:    Peripheral edema absent.  Abdominal:     Palpations: Abdomen is soft.  Skin:    General: Skin is warm and dry.  Neurological:     Mental Status: Alert and oriented to person, place and time.          ASSESSMENT & PLAN:   PVC's (premature ventricular contractions) She has had an irregular HB for the past 1 month. She has 2 PVCs noted on her EKG today. I suspect this is what is being sensed. I will obtain a BMET, TSH, Mg2+. I will also obtain a Zio XT for 3 days to assess PVC burden. If PVC burden is high, I will arrange a f/u echocardiogram and earlier f/u. Otherwise, f/u with Dr. Johnsie Cancel in [place] in 3 mos.   PFO (patent foramen ovale) She is on chronic anticoagulation and has not had further stroke symptoms. Therefore, she has not been referred for closure.   Factor V Leiden mutation ([place]) She is on chronic anticoagulation with Xarelto 20 mg once daily.             Dispo:  Return in about 3 months (around 01/10/2022) for Follow up after testing w Dr. Johnsie Cancel in [place].   Medication Adjustments/Labs and Tests Ordered: Current medicines are reviewed at length with the patient today.  Concerns regarding medicines are outlined above.  Tests Ordered: Orders Placed This Encounter  Procedures   EKG 12-Lead   Medication Changes: No orders of the defined types were placed in this encounter.  Signed, Richardson Dopp, PA-C  10/11/2021 10:56 AM    [facility] [address], [place], [place]  [address] Phone: [phone]; Fax: [phone]

## 2021-10-11 ENCOUNTER — Encounter: Payer: Self-pay | Admitting: Physician Assistant

## 2021-10-11 ENCOUNTER — Ambulatory Visit: Payer: PPO | Attending: Physician Assistant | Admitting: Physician Assistant

## 2021-10-11 ENCOUNTER — Ambulatory Visit: Payer: PPO | Attending: Physician Assistant

## 2021-10-11 VITALS — BP 102/80 | HR 80 | Ht 65.0 in | Wt 251.8 lb

## 2021-10-11 DIAGNOSIS — I493 Ventricular premature depolarization: Secondary | ICD-10-CM

## 2021-10-11 DIAGNOSIS — M4156 Other secondary scoliosis, lumbar region: Secondary | ICD-10-CM

## 2021-10-11 DIAGNOSIS — M4316 Spondylolisthesis, lumbar region: Secondary | ICD-10-CM

## 2021-10-11 DIAGNOSIS — E039 Hypothyroidism, unspecified: Secondary | ICD-10-CM | POA: Diagnosis not present

## 2021-10-11 DIAGNOSIS — D6851 Activated protein C resistance: Secondary | ICD-10-CM | POA: Diagnosis not present

## 2021-10-11 DIAGNOSIS — M5136 Other intervertebral disc degeneration, lumbar region: Secondary | ICD-10-CM

## 2021-10-11 DIAGNOSIS — Q2112 Patent foramen ovale: Secondary | ICD-10-CM

## 2021-10-11 DIAGNOSIS — M4726 Other spondylosis with radiculopathy, lumbar region: Secondary | ICD-10-CM

## 2021-10-11 DIAGNOSIS — M51369 Other intervertebral disc degeneration, lumbar region without mention of lumbar back pain or lower extremity pain: Secondary | ICD-10-CM

## 2021-10-11 DIAGNOSIS — M5416 Radiculopathy, lumbar region: Secondary | ICD-10-CM

## 2021-10-11 DIAGNOSIS — T85112S Breakdown (mechanical) of implanted electronic neurostimulator (electrode) of spinal cord, sequela: Secondary | ICD-10-CM

## 2021-10-11 DIAGNOSIS — M4724 Other spondylosis with radiculopathy, thoracic region: Secondary | ICD-10-CM

## 2021-10-11 HISTORY — DX: Ventricular premature depolarization: I49.3

## 2021-10-11 LAB — BASIC METABOLIC PANEL
BUN/Creatinine Ratio: 12 (ref 9–23)
BUN: 11 mg/dL (ref 6–24)
CO2: 24 mmol/L (ref 20–29)
Calcium: 9.5 mg/dL (ref 8.7–10.2)
Chloride: 103 mmol/L (ref 96–106)
Creatinine, Ser: 0.93 mg/dL (ref 0.57–1.00)
Glucose: 96 mg/dL (ref 70–99)
Potassium: 4.6 mmol/L (ref 3.5–5.2)
Sodium: 143 mmol/L (ref 134–144)
eGFR: 71 mL/min/{1.73_m2} (ref >59)

## 2021-10-11 LAB — TSH: TSH: 3.7 u[IU]/mL (ref 0.450–4.500)

## 2021-10-11 LAB — MAGNESIUM: Magnesium: 2.2 mg/dL (ref 1.6–2.3)

## 2021-10-11 NOTE — Assessment & Plan Note (Signed)
She has had an irregular HB for the past 1 month. She has 2 PVCs noted on her EKG today. I suspect this is what is being sensed. I will obtain a BMET, TSH, Mg2+. I will also obtain a Zio XT for 3 days to assess PVC burden. If PVC burden is high, I will arrange a f/u echocardiogram and earlier f/u. Otherwise, f/u with Dr. Johnsie Cancel in [place] in 3 mos.

## 2021-10-11 NOTE — Patient Instructions (Signed)
Medication Instructions:  Your physician recommends that you continue on your current medications as directed. Please refer to the Current Medication list given to you today.  *If you need a refill on your cardiac medications before your next appointment, please call your pharmacy*   Lab Work: TODAY:  BMET, MAG, & TSH  If you have labs (blood work) drawn today and your tests are completely normal, you will receive your results only by: [place] (if you have MyChart) OR A paper copy in the mail If you have any lab test that is abnormal or we need to change your treatment, we will call you to review the results.   Testing/Procedures: [place]- Long Term Monitor Instructions  Your physician has requested you wear a ZIO patch monitor for 3 days.  This is a single patch monitor. [employer] supplies one patch monitor per enrollment. Additional stickers are not available. Please do not apply patch if you will be having a Nuclear Stress Test,  Echocardiogram, Cardiac CT, MRI, or Chest Xray during the period you would be wearing the  monitor. The patch cannot be worn during these tests. You cannot remove and re-apply the  ZIO XT patch monitor.  Your ZIO patch monitor will be mailed 3 day USPS to your address on file. It may take 3-5 days  to receive your monitor after you have been enrolled.  Once you have received your monitor, please review the enclosed instructions. Your monitor  has already been registered assigning a specific monitor serial # to you.  Billing and Patient Assistance Program Information  We have supplied [employer] with any of your insurance information on file for billing purposes. [employer] offers a sliding scale Patient Assistance Program for patients that do not have  insurance, or whose insurance does not completely cover the cost of the ZIO monitor.  You must apply for the Patient Assistance Program to qualify for this discounted rate.  To apply, please call [employer]  at [phone], select option 4, select option 2, ask to apply for  Patient Assistance Program. Theodore Demark will ask your household income, and how many people  are in your household. They will quote your out-of-pocket cost based on that information.  [employer] will also be able to set up a 12-month interest-free payment plan if needed.  Applying the monitor   Shave hair from upper left chest.  Hold abrader disc by orange tab. Rub abrader in 40 strokes over the upper left chest as  indicated in your monitor instructions.  Clean area with 4 enclosed alcohol pads. Let dry.  Apply patch as indicated in monitor instructions. Patch will be placed under collarbone on left  side of chest with arrow pointing upward.  Rub patch adhesive wings for 2 minutes. Remove white label marked "1". Remove the white  label marked "2". Rub patch adhesive wings for 2 additional minutes.  While looking in a mirror, press and release button in center of patch. A small green light will  flash 3-4 times. This will be your only indicator that the monitor has been turned on.  Do not shower for the first 24 hours. You may shower after the first 24 hours.  Press the button if you feel a symptom. You will hear a small click. Record Date, Time and  Symptom in the Patient Logbook.  When you are ready to remove the patch, follow instructions on the last 2 pages of Patient  Logbook. Stick patch monitor onto the last page of Patient  Logbook.  Place Patient Logbook in the blue and white box. Use locking tab on box and tape box closed  securely. The blue and white box has prepaid postage on it. Please place it in the mailbox as  soon as possible. Your physician should have your test results approximately 7 days after the  monitor has been mailed back to [facility].  Call [place] at [phone] if you have questions regarding  your ZIO XT patch monitor. Call them immediately if you see an orange light  blinking on your  monitor.  If your monitor falls off in less than 4 days, contact our Monitor department at [phone].  If your monitor becomes loose or falls off after 4 days call [employer] at [phone] for  suggestions on securing your monitor    Follow-Up: At [facility], you and your health needs are our priority.  As part of our continuing mission to provide you with exceptional heart care, we have created designated Provider Care Teams.  These Care Teams include your primary Cardiologist (physician) and Advanced Practice Providers (APPs -  Physician Assistants and Nurse Practitioners) who all work together to provide you with the care you need, when you need it.  We recommend signing up for the patient portal called "MyChart".  Sign up information is provided on this After Visit Summary.  MyChart is used to connect with patients for Virtual Visits (Telemedicine).  Patients are able to view lab/test results, encounter notes, upcoming appointments, etc.  Non-urgent messages can be sent to your provider as well.   To learn more about what you can do with MyChart, go to [email or web address].    Your next appointment:   3 month(s)  The format for your next appointment:   In Person  Provider:   You may see Jenkins Rouge, MD or one of the following Advanced Practice Providers on your designated Care Team:   Bernerd Pho, PA-C  Ermalinda Barrios, PA-C     Other Instructions   Important Information About [place]

## 2021-10-11 NOTE — Assessment & Plan Note (Signed)
She is on chronic anticoagulation and has not had further stroke symptoms. Therefore, she has not been referred for closure.

## 2021-10-11 NOTE — Progress Notes (Unsigned)
Enrolled for [employer] to mail a ZIO XT long term holter monitor to the patients address on file.   Dr. Nishan to read. 

## 2021-10-11 NOTE — Assessment & Plan Note (Signed)
She is on chronic anticoagulation with Xarelto 20 mg once daily.

## 2021-10-12 DIAGNOSIS — Z8673 Personal history of transient ischemic attack (TIA), and cerebral infarction without residual deficits: Secondary | ICD-10-CM | POA: Diagnosis not present

## 2021-10-12 DIAGNOSIS — M4726 Other spondylosis with radiculopathy, lumbar region: Secondary | ICD-10-CM | POA: Diagnosis not present

## 2021-10-12 DIAGNOSIS — F5101 Primary insomnia: Secondary | ICD-10-CM | POA: Diagnosis not present

## 2021-10-12 DIAGNOSIS — Z4789 Encounter for other orthopedic aftercare: Secondary | ICD-10-CM | POA: Diagnosis not present

## 2021-10-12 DIAGNOSIS — E039 Hypothyroidism, unspecified: Secondary | ICD-10-CM | POA: Diagnosis not present

## 2021-10-12 DIAGNOSIS — Z90710 Acquired absence of both cervix and uterus: Secondary | ICD-10-CM | POA: Diagnosis not present

## 2021-10-12 DIAGNOSIS — G43909 Migraine, unspecified, not intractable, without status migrainosus: Secondary | ICD-10-CM | POA: Diagnosis not present

## 2021-10-12 DIAGNOSIS — K219 Gastro-esophageal reflux disease without esophagitis: Secondary | ICD-10-CM | POA: Diagnosis not present

## 2021-10-12 DIAGNOSIS — Z86711 Personal history of pulmonary embolism: Secondary | ICD-10-CM | POA: Diagnosis not present

## 2021-10-12 DIAGNOSIS — Z96653 Presence of artificial knee joint, bilateral: Secondary | ICD-10-CM | POA: Diagnosis not present

## 2021-10-12 DIAGNOSIS — F909 Attention-deficit hyperactivity disorder, unspecified type: Secondary | ICD-10-CM | POA: Diagnosis not present

## 2021-10-12 DIAGNOSIS — M159 Polyosteoarthritis, unspecified: Secondary | ICD-10-CM | POA: Diagnosis not present

## 2021-10-12 DIAGNOSIS — M797 Fibromyalgia: Secondary | ICD-10-CM | POA: Diagnosis not present

## 2021-10-12 DIAGNOSIS — Z7901 Long term (current) use of anticoagulants: Secondary | ICD-10-CM | POA: Diagnosis not present

## 2021-10-12 DIAGNOSIS — M8580 Other specified disorders of bone density and structure, unspecified site: Secondary | ICD-10-CM | POA: Diagnosis not present

## 2021-10-12 DIAGNOSIS — D692 Other nonthrombocytopenic purpura: Secondary | ICD-10-CM | POA: Diagnosis not present

## 2021-10-12 DIAGNOSIS — N3281 Overactive bladder: Secondary | ICD-10-CM | POA: Diagnosis not present

## 2021-10-12 DIAGNOSIS — G43709 Chronic migraine without aura, not intractable, without status migrainosus: Secondary | ICD-10-CM | POA: Diagnosis not present

## 2021-10-12 DIAGNOSIS — G4733 Obstructive sleep apnea (adult) (pediatric): Secondary | ICD-10-CM | POA: Diagnosis not present

## 2021-10-12 DIAGNOSIS — Z981 Arthrodesis status: Secondary | ICD-10-CM | POA: Diagnosis not present

## 2021-10-12 DIAGNOSIS — K5903 Drug induced constipation: Secondary | ICD-10-CM | POA: Diagnosis not present

## 2021-10-12 DIAGNOSIS — D62 Acute posthemorrhagic anemia: Secondary | ICD-10-CM | POA: Diagnosis not present

## 2021-10-12 DIAGNOSIS — M519 Unspecified thoracic, thoracolumbar and lumbosacral intervertebral disc disorder: Secondary | ICD-10-CM | POA: Diagnosis not present

## 2021-10-12 DIAGNOSIS — E7212 Methylenetetrahydrofolate reductase deficiency: Secondary | ICD-10-CM | POA: Diagnosis not present

## 2021-10-12 DIAGNOSIS — G894 Chronic pain syndrome: Secondary | ICD-10-CM | POA: Diagnosis not present

## 2021-10-12 DIAGNOSIS — R7303 Prediabetes: Secondary | ICD-10-CM | POA: Diagnosis not present

## 2021-10-12 DIAGNOSIS — M419 Scoliosis, unspecified: Secondary | ICD-10-CM | POA: Diagnosis not present

## 2021-10-13 DIAGNOSIS — I493 Ventricular premature depolarization: Secondary | ICD-10-CM | POA: Diagnosis not present

## 2021-10-16 ENCOUNTER — Telehealth (INDEPENDENT_AMBULATORY_CARE_PROVIDER_SITE_OTHER): Payer: Self-pay

## 2021-10-16 NOTE — Telephone Encounter (Signed)
Patient called and says she is having some joint pains and Dr. Laural Golden and told her in the past that she should not take any NSAIDS. I advised that our providers were not currently in the office if she needed immediate relief she soul reach out to her pcp and let them know her history and see what she suggests for her pain. Patient states understanding.

## 2021-10-16 NOTE — Progress Notes (Signed)
Pt has been made aware of normal result and verbalized understanding.  jw

## 2021-10-17 DIAGNOSIS — G894 Chronic pain syndrome: Secondary | ICD-10-CM | POA: Diagnosis not present

## 2021-10-17 DIAGNOSIS — Z90710 Acquired absence of both cervix and uterus: Secondary | ICD-10-CM | POA: Diagnosis not present

## 2021-10-17 DIAGNOSIS — M797 Fibromyalgia: Secondary | ICD-10-CM | POA: Diagnosis not present

## 2021-10-17 DIAGNOSIS — R7303 Prediabetes: Secondary | ICD-10-CM | POA: Diagnosis not present

## 2021-10-17 DIAGNOSIS — M8580 Other specified disorders of bone density and structure, unspecified site: Secondary | ICD-10-CM | POA: Diagnosis not present

## 2021-10-17 DIAGNOSIS — E039 Hypothyroidism, unspecified: Secondary | ICD-10-CM | POA: Diagnosis not present

## 2021-10-17 DIAGNOSIS — N3281 Overactive bladder: Secondary | ICD-10-CM | POA: Diagnosis not present

## 2021-10-17 DIAGNOSIS — D62 Acute posthemorrhagic anemia: Secondary | ICD-10-CM | POA: Diagnosis not present

## 2021-10-17 DIAGNOSIS — M419 Scoliosis, unspecified: Secondary | ICD-10-CM | POA: Diagnosis not present

## 2021-10-17 DIAGNOSIS — F5101 Primary insomnia: Secondary | ICD-10-CM | POA: Diagnosis not present

## 2021-10-17 DIAGNOSIS — Z4789 Encounter for other orthopedic aftercare: Secondary | ICD-10-CM | POA: Diagnosis not present

## 2021-10-17 DIAGNOSIS — Z86711 Personal history of pulmonary embolism: Secondary | ICD-10-CM | POA: Diagnosis not present

## 2021-10-17 DIAGNOSIS — Z7901 Long term (current) use of anticoagulants: Secondary | ICD-10-CM | POA: Diagnosis not present

## 2021-10-17 DIAGNOSIS — K5903 Drug induced constipation: Secondary | ICD-10-CM | POA: Diagnosis not present

## 2021-10-17 DIAGNOSIS — K219 Gastro-esophageal reflux disease without esophagitis: Secondary | ICD-10-CM | POA: Diagnosis not present

## 2021-10-17 DIAGNOSIS — Z96653 Presence of artificial knee joint, bilateral: Secondary | ICD-10-CM | POA: Diagnosis not present

## 2021-10-17 DIAGNOSIS — M519 Unspecified thoracic, thoracolumbar and lumbosacral intervertebral disc disorder: Secondary | ICD-10-CM | POA: Diagnosis not present

## 2021-10-17 DIAGNOSIS — M159 Polyosteoarthritis, unspecified: Secondary | ICD-10-CM | POA: Diagnosis not present

## 2021-10-17 DIAGNOSIS — Z981 Arthrodesis status: Secondary | ICD-10-CM | POA: Diagnosis not present

## 2021-10-17 DIAGNOSIS — G43709 Chronic migraine without aura, not intractable, without status migrainosus: Secondary | ICD-10-CM | POA: Diagnosis not present

## 2021-10-17 DIAGNOSIS — G4733 Obstructive sleep apnea (adult) (pediatric): Secondary | ICD-10-CM | POA: Diagnosis not present

## 2021-10-17 DIAGNOSIS — Z8673 Personal history of transient ischemic attack (TIA), and cerebral infarction without residual deficits: Secondary | ICD-10-CM | POA: Diagnosis not present

## 2021-10-17 DIAGNOSIS — F909 Attention-deficit hyperactivity disorder, unspecified type: Secondary | ICD-10-CM | POA: Diagnosis not present

## 2021-10-18 DIAGNOSIS — E559 Vitamin D deficiency, unspecified: Secondary | ICD-10-CM | POA: Diagnosis not present

## 2021-10-18 DIAGNOSIS — M1711 Unilateral primary osteoarthritis, right knee: Secondary | ICD-10-CM | POA: Diagnosis not present

## 2021-10-18 DIAGNOSIS — M797 Fibromyalgia: Secondary | ICD-10-CM | POA: Diagnosis not present

## 2021-10-18 DIAGNOSIS — E039 Hypothyroidism, unspecified: Secondary | ICD-10-CM | POA: Diagnosis not present

## 2021-10-18 DIAGNOSIS — Z6841 Body Mass Index (BMI) 40.0 and over, adult: Secondary | ICD-10-CM | POA: Diagnosis not present

## 2021-10-19 DIAGNOSIS — I493 Ventricular premature depolarization: Secondary | ICD-10-CM | POA: Diagnosis not present

## 2021-10-20 ENCOUNTER — Encounter: Payer: Self-pay | Admitting: Physician Assistant

## 2021-10-20 DIAGNOSIS — Z8673 Personal history of transient ischemic attack (TIA), and cerebral infarction without residual deficits: Secondary | ICD-10-CM | POA: Diagnosis not present

## 2021-10-20 DIAGNOSIS — Z96653 Presence of artificial knee joint, bilateral: Secondary | ICD-10-CM | POA: Diagnosis not present

## 2021-10-20 DIAGNOSIS — Z86711 Personal history of pulmonary embolism: Secondary | ICD-10-CM | POA: Diagnosis not present

## 2021-10-20 DIAGNOSIS — F5101 Primary insomnia: Secondary | ICD-10-CM | POA: Diagnosis not present

## 2021-10-20 DIAGNOSIS — Z7901 Long term (current) use of anticoagulants: Secondary | ICD-10-CM | POA: Diagnosis not present

## 2021-10-20 DIAGNOSIS — Z4789 Encounter for other orthopedic aftercare: Secondary | ICD-10-CM | POA: Diagnosis not present

## 2021-10-20 DIAGNOSIS — G43709 Chronic migraine without aura, not intractable, without status migrainosus: Secondary | ICD-10-CM | POA: Diagnosis not present

## 2021-10-20 DIAGNOSIS — M159 Polyosteoarthritis, unspecified: Secondary | ICD-10-CM | POA: Diagnosis not present

## 2021-10-20 DIAGNOSIS — Z981 Arthrodesis status: Secondary | ICD-10-CM | POA: Diagnosis not present

## 2021-10-20 DIAGNOSIS — E039 Hypothyroidism, unspecified: Secondary | ICD-10-CM | POA: Diagnosis not present

## 2021-10-20 DIAGNOSIS — R7303 Prediabetes: Secondary | ICD-10-CM | POA: Diagnosis not present

## 2021-10-20 DIAGNOSIS — G894 Chronic pain syndrome: Secondary | ICD-10-CM | POA: Diagnosis not present

## 2021-10-20 DIAGNOSIS — M8580 Other specified disorders of bone density and structure, unspecified site: Secondary | ICD-10-CM | POA: Diagnosis not present

## 2021-10-20 DIAGNOSIS — Z90710 Acquired absence of both cervix and uterus: Secondary | ICD-10-CM | POA: Diagnosis not present

## 2021-10-20 DIAGNOSIS — F909 Attention-deficit hyperactivity disorder, unspecified type: Secondary | ICD-10-CM | POA: Diagnosis not present

## 2021-10-20 DIAGNOSIS — D62 Acute posthemorrhagic anemia: Secondary | ICD-10-CM | POA: Diagnosis not present

## 2021-10-20 DIAGNOSIS — K219 Gastro-esophageal reflux disease without esophagitis: Secondary | ICD-10-CM | POA: Diagnosis not present

## 2021-10-20 DIAGNOSIS — N3281 Overactive bladder: Secondary | ICD-10-CM | POA: Diagnosis not present

## 2021-10-20 DIAGNOSIS — G4733 Obstructive sleep apnea (adult) (pediatric): Secondary | ICD-10-CM | POA: Diagnosis not present

## 2021-10-20 DIAGNOSIS — M797 Fibromyalgia: Secondary | ICD-10-CM | POA: Diagnosis not present

## 2021-10-20 DIAGNOSIS — K5903 Drug induced constipation: Secondary | ICD-10-CM | POA: Diagnosis not present

## 2021-10-20 DIAGNOSIS — M419 Scoliosis, unspecified: Secondary | ICD-10-CM | POA: Diagnosis not present

## 2021-10-20 DIAGNOSIS — M519 Unspecified thoracic, thoracolumbar and lumbosacral intervertebral disc disorder: Secondary | ICD-10-CM | POA: Diagnosis not present

## 2021-10-23 ENCOUNTER — Telehealth (INDEPENDENT_AMBULATORY_CARE_PROVIDER_SITE_OTHER): Payer: Self-pay | Admitting: *Deleted

## 2021-10-23 ENCOUNTER — Other Ambulatory Visit: Payer: Self-pay | Admitting: Gastroenterology

## 2021-10-23 DIAGNOSIS — Z9884 Bariatric surgery status: Secondary | ICD-10-CM

## 2021-10-23 DIAGNOSIS — K5903 Drug induced constipation: Secondary | ICD-10-CM

## 2021-10-23 DIAGNOSIS — K219 Gastro-esophageal reflux disease without esophagitis: Secondary | ICD-10-CM

## 2021-10-23 MED ORDER — METOCLOPRAMIDE HCL 5 MG PO TABS: 5.0000 mg | ORAL_TABLET | Freq: Three times a day (TID) | ORAL | 2 refills | Status: DC | PRN

## 2021-10-23 MED ORDER — NALOXEGOL OXALATE 25 MG PO TABS: 25.0000 mg | ORAL_TABLET | Freq: Every day | ORAL | 1 refills | Status: DC

## 2021-10-23 NOTE — Telephone Encounter (Signed)
Patient needs refill on metoclopramide and movantik to [facility]. Last visit 09/15/21 with [facility].   She reports she talked to Dr. Laural Golden about taking celebrex instead of ibuprofen for her arthritis because she was taking so much. Reports he was going to write it for her. Her pcp Dr. Hilma Favors wrote for celebrex '200mg'$  one daily she started on 9/27. States he told her pain management should take it over but she did not know if [place] would or not since dr Laural Golden was willing to? She does currently see pain management.   Also since starting celebrex on 9/27 she has noticed darker stools. She wants to see if she can double up on both omeprazole and famotidine to protect her stomach.

## 2021-10-23 NOTE — Telephone Encounter (Signed)
Discussed with patient and patient wanted me to let [facility] know she will need new directions for omeprazole bid and famotidine bid to pharmacy since the pharmacy packs her pills. And see message below from [place]. Patient aware Vikki Ports is out of office today.

## 2021-10-24 ENCOUNTER — Other Ambulatory Visit (INDEPENDENT_AMBULATORY_CARE_PROVIDER_SITE_OTHER): Payer: Self-pay | Admitting: Gastroenterology

## 2021-10-24 ENCOUNTER — Other Ambulatory Visit (INDEPENDENT_AMBULATORY_CARE_PROVIDER_SITE_OTHER): Payer: Self-pay | Admitting: *Deleted

## 2021-10-24 DIAGNOSIS — G894 Chronic pain syndrome: Secondary | ICD-10-CM | POA: Diagnosis not present

## 2021-10-24 DIAGNOSIS — M159 Polyosteoarthritis, unspecified: Secondary | ICD-10-CM | POA: Diagnosis not present

## 2021-10-24 DIAGNOSIS — K5903 Drug induced constipation: Secondary | ICD-10-CM | POA: Diagnosis not present

## 2021-10-24 DIAGNOSIS — M419 Scoliosis, unspecified: Secondary | ICD-10-CM | POA: Diagnosis not present

## 2021-10-24 DIAGNOSIS — M519 Unspecified thoracic, thoracolumbar and lumbosacral intervertebral disc disorder: Secondary | ICD-10-CM | POA: Diagnosis not present

## 2021-10-24 DIAGNOSIS — Z4789 Encounter for other orthopedic aftercare: Secondary | ICD-10-CM | POA: Diagnosis not present

## 2021-10-24 DIAGNOSIS — D62 Acute posthemorrhagic anemia: Secondary | ICD-10-CM | POA: Diagnosis not present

## 2021-10-24 DIAGNOSIS — M8580 Other specified disorders of bone density and structure, unspecified site: Secondary | ICD-10-CM | POA: Diagnosis not present

## 2021-10-24 DIAGNOSIS — R7303 Prediabetes: Secondary | ICD-10-CM | POA: Diagnosis not present

## 2021-10-24 DIAGNOSIS — F909 Attention-deficit hyperactivity disorder, unspecified type: Secondary | ICD-10-CM | POA: Diagnosis not present

## 2021-10-24 DIAGNOSIS — Z90710 Acquired absence of both cervix and uterus: Secondary | ICD-10-CM | POA: Diagnosis not present

## 2021-10-24 DIAGNOSIS — G4733 Obstructive sleep apnea (adult) (pediatric): Secondary | ICD-10-CM | POA: Diagnosis not present

## 2021-10-24 DIAGNOSIS — Z86711 Personal history of pulmonary embolism: Secondary | ICD-10-CM | POA: Diagnosis not present

## 2021-10-24 DIAGNOSIS — K219 Gastro-esophageal reflux disease without esophagitis: Secondary | ICD-10-CM | POA: Diagnosis not present

## 2021-10-24 DIAGNOSIS — Z96653 Presence of artificial knee joint, bilateral: Secondary | ICD-10-CM | POA: Diagnosis not present

## 2021-10-24 DIAGNOSIS — Z7901 Long term (current) use of anticoagulants: Secondary | ICD-10-CM | POA: Diagnosis not present

## 2021-10-24 DIAGNOSIS — G43709 Chronic migraine without aura, not intractable, without status migrainosus: Secondary | ICD-10-CM | POA: Diagnosis not present

## 2021-10-24 DIAGNOSIS — Z981 Arthrodesis status: Secondary | ICD-10-CM | POA: Diagnosis not present

## 2021-10-24 DIAGNOSIS — Z8673 Personal history of transient ischemic attack (TIA), and cerebral infarction without residual deficits: Secondary | ICD-10-CM | POA: Diagnosis not present

## 2021-10-24 DIAGNOSIS — F5101 Primary insomnia: Secondary | ICD-10-CM | POA: Diagnosis not present

## 2021-10-24 DIAGNOSIS — E039 Hypothyroidism, unspecified: Secondary | ICD-10-CM | POA: Diagnosis not present

## 2021-10-24 DIAGNOSIS — N3281 Overactive bladder: Secondary | ICD-10-CM | POA: Diagnosis not present

## 2021-10-24 DIAGNOSIS — M797 Fibromyalgia: Secondary | ICD-10-CM | POA: Diagnosis not present

## 2021-10-24 MED ORDER — OMEPRAZOLE 40 MG PO CPDR: 40.0000 mg | DELAYED_RELEASE_CAPSULE | Freq: Two times a day (BID) | ORAL | 1 refills | Status: DC

## 2021-10-24 MED ORDER — OMEPRAZOLE 40 MG PO CPDR
40.0000 mg | DELAYED_RELEASE_CAPSULE | Freq: Two times a day (BID) | ORAL | 1 refills | Status: DC
Start: 2021-10-24 — End: 2021-10-24

## 2021-10-24 NOTE — Telephone Encounter (Signed)
Called and discussed with patient per [employer] - Patient actually had EGD at outside facility in July so she Is up to date on [facility] surveillance until next year.   We can try increasing the omeprazole to '40mg'$  BID for now, continue with the famotidine daily. She should avoid NSAIDs and if she is having black stools then we may need to see her back in the office and she would need to stop the celebrex as well, if they are just darker brown, this is not cause for concern  Patient verbalized understanding and told me she would check her stool next BM and let us know if black stools or if just darker brown.

## 2021-10-25 DIAGNOSIS — Z79891 Long term (current) use of opiate analgesic: Secondary | ICD-10-CM | POA: Diagnosis not present

## 2021-10-25 DIAGNOSIS — M5416 Radiculopathy, lumbar region: Secondary | ICD-10-CM | POA: Diagnosis not present

## 2021-10-25 DIAGNOSIS — M47816 Spondylosis without myelopathy or radiculopathy, lumbar region: Secondary | ICD-10-CM | POA: Diagnosis not present

## 2021-10-25 DIAGNOSIS — M79671 Pain in right foot: Secondary | ICD-10-CM | POA: Diagnosis not present

## 2021-10-25 DIAGNOSIS — G894 Chronic pain syndrome: Secondary | ICD-10-CM | POA: Diagnosis not present

## 2021-11-02 ENCOUNTER — Ambulatory Visit: Payer: PPO | Admitting: Specialist

## 2021-11-02 ENCOUNTER — Encounter: Payer: Self-pay | Admitting: Specialist

## 2021-11-02 ENCOUNTER — Ambulatory Visit: Payer: Self-pay

## 2021-11-02 VITALS — BP 148/92 | HR 87 | Ht 64.5 in | Wt 240.0 lb

## 2021-11-02 DIAGNOSIS — M533 Sacrococcygeal disorders, not elsewhere classified: Secondary | ICD-10-CM | POA: Diagnosis not present

## 2021-11-02 DIAGNOSIS — Z981 Arthrodesis status: Secondary | ICD-10-CM

## 2021-11-02 DIAGNOSIS — M4155 Other secondary scoliosis, thoracolumbar region: Secondary | ICD-10-CM

## 2021-11-02 DIAGNOSIS — M5136 Other intervertebral disc degeneration, lumbar region: Secondary | ICD-10-CM

## 2021-11-02 NOTE — Patient Instructions (Signed)
Plan: Avoid frequent bending and stooping  No lifting greater than 10 lbs. May use ice or moist heat for pain. Weight loss is of benefit. Best medication for lumbar disc disease is arthritis medications like motrin, celebrex and naprosyn. Exercise is important to improve your indurance and does allow people to function better inspite of back pain.  May bath as needed Pool exercise, removing brace while in pool. Okay to drive. May wean away from the brace and take brace with you on your trips the the parkways.

## 2021-11-02 NOTE — Progress Notes (Signed)
Post-Op Visit Note   Patient: Debbie Pineda           Date of Birth: 06/25/1960           MRN: [id number] Visit Date: 11/02/2021 PCP: Jake Samples, PA-C   Assessment & Plan:  Chief Complaint: No chief complaint on file.  Visit Diagnoses:  1. S/P lumbar fusion   2. Other secondary scoliosis, thoracolumbar region   3. Lumbar degenerative disc disease   4. Sacroiliac joint pain    Healed incision, no complications Motor, heel and toe walking intact. Motor with out focal deficit. Radiographs with hardware intact T10 to S1, bilateral SI arthrosis that is mild.   Plan: Avoid frequent bending and stooping  No lifting greater than 10 lbs. May use ice or moist heat for pain. Weight loss is of benefit. Best medication for lumbar disc disease is arthritis medications like motrin, celebrex and naprosyn. Exercise is important to improve your indurance and does allow people to function better inspite of back pain.  May bath as needed Pool exercise, removing brace while in pool. Okay to drive. May wean away from the brace and take brace with you on your trips the the parkways.   Follow-Up Instructions: No follow-ups on file.   Orders:  Orders Placed This Encounter  Procedures   XR THORACOLUMABAR SPINE   No orders of the defined types were placed in this encounter.   Imaging: No results found.  PMFS History: Patient Active Problem List   Diagnosis Date Noted   PVC's (premature ventricular contractions) 10/11/2021   Factor V Leiden mutation ([place]) 10/11/2021   Other secondary scoliosis, lumbar region    Other spondylosis with radiculopathy, lumbar region    Spondylolisthesis, lumbar region    Thoracic spondylosis with radiculopathy    Radiculopathy, lumbar region    Breakdown (mechanical) of implanted electronic neurostimulator of spinal cord electrode (lead), sequela    Degenerative disc disease, lumbar    At risk for adverse drug event 08/31/2021   Acute on  chronic blood loss anemia 08/28/2021   Drug-induced constipation 08/28/2021   GERD without esophagitis 08/28/2021   Overactive bladder 08/28/2021   Chronic migraine without aura 08/28/2021   Hx of blood clots 08/28/2021   Major neurocognitive disorder ([place]) 08/28/2021   Status post lumbar spinal fusion 08/21/2021   Weight gain 07/13/2021   Encounter for removal of internal fixation device 10/21/2020   LFTs abnormal 03/02/2020   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   Gastroesophageal reflux disease 09/22/2019   SSBE (short-segment Barrett's esophagus) 09/22/2019   Flatulence 09/22/2019   Migraine 11/11/2018   Abdominal pain, epigastric 10/14/2018   LUQ pain 10/14/2018   Attention deficit hyperactivity disorder (ADHD) 01/12/2018   Insomnia 01/12/2018   Elevated liver enzymes 09/10/2017   History of diabetes mellitus 06/06/2017   Primary osteoarthritis of right hand 06/06/2017   Transaminasemia    TIA (transient ischemic attack) 05/01/2017   Dysphasia 05/01/2017   Chronic pain syndrome 05/01/2017   Confusion    Presence of right artificial knee joint 09/17/2016   H/O total knee replacement, right 08/15/2016   Presence of retained hardware    Memory disorder 08/14/2016   Cerebrovascular disease 08/14/2016   Unilateral primary osteoarthritis, right knee 05/29/2016   Primary osteoarthritis of both feet 05/29/2016   Trochanteric bursitis of both hips 05/25/2016   Neck pain 05/25/2016   Urinary urgency 04/19/2016   Chronic low back pain 01/11/2016   Total knee replacement status 03/23/2015  Heart palpitations 12/14/2013   Generalized osteoarthritis of multiple sites 11/04/2013   Elevated LFTs 03/17/2012   Cerebral infarction ([place]) 10/30/2011   PFO (patent foramen ovale) 10/30/2011   Acquired hypothyroidism    Thrombophlebitis    OSA on CPAP    Fibromyalgia    Status post bariatric surgery 12/07/2010   Vein disorder 11/29/2010   S/P total  hysterectomy and bilateral salpingo-oophorectomy 11/29/2010   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   S/P cholecystectomy 11/29/2010   S/P ACL surgery 11/29/2010   Morbid obesity ([place]) 11/10/2010   Past Medical History:  Diagnosis Date   ADD (attention deficit disorder)    Arthritis    "all over"   Cerebral infarction ([place]) 10/30/2011   Cerebrovascular disease 08/14/2016   Chronic back pain    "all over"   Chronic low back pain 75/64/3329   Complication of anesthesia    tends to have hypotension when NPO and post-anesthesia   Constipation    takes stool softener daily   Degenerative disk disease    Degenerative joint disease    DVT (deep venous thrombosis) ([place]) 2014   RLE   Family history of adverse reaction to anesthesia    a family member woke up during surgery; "think it was my mom"   Fibromyalgia    Generalized osteoarthritis of multiple sites 11/04/2013   GERD (gastroesophageal reflux disease)    Headache    Heart palpitations 12/14/2013   resolved   History of blood clots    superficial   Hypoglycemia    Hypothyroid    takes Synthroid daily   Incomplete emptying of bladder    Insomnia    takes Trazodone nightly   Iron deficiency anemia    takes Ferrous Sulfate daily   Joint pain    Joint swelling    knees and ankles   Memory disorder 08/14/2016   Morbid obesity ([place])    Nausea    takes Zofran as needed.Seeing GI doc   Neck pain 05/25/2016   OSA on CPAP    tested more than 5 yrs ago.     Osteoarthritis    Osteopenia    in feet   PFO (patent foramen ovale)    no murmur   Pre-diabetes    Primary osteoarthritis of both feet 05/29/2016   Right bunionectomy August 2017 by Dr. Sharol Given   PVC's (premature ventricular contractions) 10/11/2021   Monitor 9/23: 1.8% PVCs; rare PACs; o/w NSR   Scoliosis    Skin abnormality 02/04/2020   raised area on lip   Stroke [facility]) "several"last 2014   right foot weakness; memory issues, black spot right visual  field since" (03/23/2015)   Thrombophlebitis    Trochanteric bursitis of both hips 05/25/2016   Unilateral primary osteoarthritis, right knee 05/29/2016   Urinary urgency 04/19/2016   Vein disorder 11/29/2010   varicose veins both legs   Wears glasses     Family History  Problem Relation Age of Onset   Cancer Mother        skin and breast   Myasthenia gravis Mother    Breast cancer Mother    Heart disease Father    Cancer Father    Parkinson's disease Father    Heart disease Sister    Heart attack Sister    Heart disease Brother    Cancer Brother    Diabetes Brother    Stroke Brother    Cancer Maternal Grandfather    Hypothyroidism Daughter    Hypertension Other  Colon cancer Neg Hx     Past Surgical History:  Procedure Laterality Date   BIOPSY  11/14/2018   Procedure: BIOPSY;  Surgeon: Rogene Houston, MD;  [facility];  Service: Endoscopy;;  esophagusgastric   BONE EXCISION Right 08/29/2017   Procedure: right trapezium excision;  Surgeon: Daryll Brod, MD;  Location: [place];  Service: Orthopedics;  Laterality: Right;   BUNIONECTOMY Right 08/2015   CARDIAC CATHETERIZATION     2008.  "it was fine" (not sure why she had it done, and doesn't know where)   CARPOMETACARPEL SUSPENSION PLASTY Right 08/29/2017   Procedure: SUSPENSION PLASTY RIGHT THUMB;  Surgeon: Daryll Brod, MD;  Location: [place];  Service: Orthopedics;  Laterality: Right;   COLONOSCOPY N/A 03/25/2013   Procedure: COLONOSCOPY;  Surgeon: Rogene Houston, MD;  [facility];  Service: Endoscopy;  Laterality: N/A;  930   ERCP  06/02/2020   ESOPHAGOGASTRODUODENOSCOPY     ESOPHAGOGASTRODUODENOSCOPY (EGD) WITH PROPOFOL N/A 11/14/2018   Procedure: ESOPHAGOGASTRODUODENOSCOPY (EGD) WITH PROPOFOL;  Surgeon: Rogene Houston, MD;  [facility];  Service: Endoscopy;  Laterality: N/A;  1:55pm-office moved to 11:00am/pt notified to arrive at 9:30am per [place]     "took fallopian tubes out"   [place] Right 02/12/2020   Procedure: HALLUX INTERPHANGEAL JOINT  FUSION;  Surgeon: Felipa Furnace, DPM;  Location: [place];  Service: Podiatry;  Laterality: Right;   HAMMER TOE SURGERY Right 02/12/2020   Procedure: SECOND AND THIRD HAMMER TOE CORRECTION; CAPSULOTOMY SECOND INTERPHALANGEAL JOINT;  Surgeon: Felipa Furnace, DPM;  Location: [place];  Service: Podiatry;  Laterality: Right;   JOINT REPLACEMENT     bil knee    KNEE ARTHROSCOPY Left    KNEE ARTHROSCOPY W/ ACL RECONSTRUCTION Right yrs ago   "added pins"   LAPAROSCOPIC CHOLECYSTECTOMY  ~ 2001   ROUX-EN-Y GASTRIC BYPASS  11/20/2010   [place]   SPINAL CORD STIMULATOR INSERTION N/A 04/18/2017   Procedure: LUMBAR SPINAL CORD STIMULATOR INSERTION;  Surgeon: Clydell Hakim, MD;  Location: [place];  Service: Neurosurgery;  Laterality: N/A;  LUMBAR SPINAL CORD STIMULATOR INSERTION   stomach stent  04/28/2020   TENDON TRANSFER Right 08/29/2017   Procedure: right abductor pollicis longus transfer;  Surgeon: Daryll Brod, MD;  Location: [place];  Service: Orthopedics;  Laterality: Right;   TOTAL KNEE ARTHROPLASTY Left 03/23/2015   Procedure: TOTAL KNEE ARTHROPLASTY;  Surgeon: Newt Minion, MD;  Location: [place];  Service: Orthopedics;  Laterality: Left;   TOTAL KNEE ARTHROPLASTY Right 08/15/2016   Procedure: RIGHT TOTAL KNEE ARTHROPLASTY, REMOVAL ACL SCREWS;  Surgeon: Newt Minion, MD;  Location: [place];  Service: Orthopedics;  Laterality: Right;   TOTAL KNEE ARTHROPLASTY WITH HARDWARE REMOVAL Right    VAGINAL HYSTERECTOMY     tah/bso   VARICOSE VEIN SURGERY Right X 2   WEIL OSTEOTOMY Right 02/12/2020   Procedure: DOUBLE L OSTEOTOMY;  Surgeon: Felipa Furnace, DPM;  Location: [place];  Service: Podiatry;  Laterality: Right;   Social History   Occupational History   Occupation: Disability   Occupation: formerly  Therapist, sports, [employer]  Tobacco Use   Smoking status: Former    Packs/day: 0.75    Years: 8.00    Total pack years: 6.00    Types: Cigarettes    Quit date: 12/01/1990    Years since quitting: 30.9  Passive exposure: Past   Smokeless tobacco: Never   Tobacco comments:    quit smoking in the 1990s  Vaping Use   Vaping Use: Never used  Substance and Sexual Activity   Alcohol use: No    Comment: 03/23/2015 "stopped drinking in 2012 w/gastric bypass; drank socially before bypass"   Drug use: No   Sexual activity: Not Currently    Birth control/protection: Surgical

## 2021-11-03 ENCOUNTER — Other Ambulatory Visit: Payer: Self-pay | Admitting: Psychiatry

## 2021-11-09 DIAGNOSIS — G4720 Circadian rhythm sleep disorder, unspecified type: Secondary | ICD-10-CM | POA: Diagnosis not present

## 2021-11-09 DIAGNOSIS — Z6841 Body Mass Index (BMI) 40.0 and over, adult: Secondary | ICD-10-CM | POA: Diagnosis not present

## 2021-11-09 DIAGNOSIS — M797 Fibromyalgia: Secondary | ICD-10-CM | POA: Diagnosis not present

## 2021-11-09 DIAGNOSIS — F1120 Opioid dependence, uncomplicated: Secondary | ICD-10-CM | POA: Diagnosis not present

## 2021-11-09 DIAGNOSIS — Z9884 Bariatric surgery status: Secondary | ICD-10-CM | POA: Diagnosis not present

## 2021-11-09 DIAGNOSIS — F902 Attention-deficit hyperactivity disorder, combined type: Secondary | ICD-10-CM | POA: Diagnosis not present

## 2021-11-21 DIAGNOSIS — M797 Fibromyalgia: Secondary | ICD-10-CM | POA: Diagnosis not present

## 2021-11-21 DIAGNOSIS — E063 Autoimmune thyroiditis: Secondary | ICD-10-CM | POA: Diagnosis not present

## 2021-11-21 DIAGNOSIS — E782 Mixed hyperlipidemia: Secondary | ICD-10-CM | POA: Diagnosis not present

## 2021-11-22 ENCOUNTER — Ambulatory Visit: Payer: PPO | Admitting: Sports Medicine

## 2021-11-22 DIAGNOSIS — G894 Chronic pain syndrome: Secondary | ICD-10-CM | POA: Diagnosis not present

## 2021-11-22 DIAGNOSIS — M5416 Radiculopathy, lumbar region: Secondary | ICD-10-CM | POA: Diagnosis not present

## 2021-11-22 DIAGNOSIS — M47816 Spondylosis without myelopathy or radiculopathy, lumbar region: Secondary | ICD-10-CM | POA: Diagnosis not present

## 2021-11-22 DIAGNOSIS — M79671 Pain in right foot: Secondary | ICD-10-CM | POA: Diagnosis not present

## 2021-11-23 ENCOUNTER — Ambulatory Visit: Payer: Self-pay

## 2021-11-23 ENCOUNTER — Ambulatory Visit (INDEPENDENT_AMBULATORY_CARE_PROVIDER_SITE_OTHER): Payer: PPO | Admitting: Sports Medicine

## 2021-11-23 ENCOUNTER — Encounter: Payer: Self-pay | Admitting: Sports Medicine

## 2021-11-23 DIAGNOSIS — M533 Sacrococcygeal disorders, not elsewhere classified: Secondary | ICD-10-CM

## 2021-11-23 DIAGNOSIS — G8929 Other chronic pain: Secondary | ICD-10-CM | POA: Diagnosis not present

## 2021-11-23 DIAGNOSIS — Z981 Arthrodesis status: Secondary | ICD-10-CM | POA: Diagnosis not present

## 2021-11-23 DIAGNOSIS — M25571 Pain in right ankle and joints of right foot: Secondary | ICD-10-CM

## 2021-11-23 NOTE — Progress Notes (Signed)
Debbie Pineda - 61 y.o. female MRN [id number]  Date of birth: Jan 06, 1961  Office Visit Note: Visit Date: 11/23/2021 PCP: Jake Samples, PA-C Referred by: Jessy Oto, MD  Subjective: No chief complaint on file.  HPI: Debbie Pineda is a pleasant 61 y.o. female who presents today for right lower back and SI joint pain.  Is a patient of Dr. Sharrie Rothman, she is fused from T10-S1.  Recent surgery was August 21, 2021.  She did complete some home health and therapy, currently not doing any at this time.  The back pain is doing well, but she has pain localized over the right SI joint.  Does take NSAIDs but will help to some extent.  Saw Dr. Cory Munch on 11/02/2021 and they did discuss if she is not getting better with conservative treatment, may consider an SI joint injection.  She is inquiring about this today.  No radicular pain that goes down the leg.  Has chronic right foot pain, had surgery here prior.  Pertinent ROS were reviewed with the patient and found to be negative unless otherwise specified above in HPI.   Assessment & Plan: Visit Diagnoses:  1. Sacroiliac joint pain   2. S/P lumbar fusion   3. Chronic right SI joint pain   4. Pain in right ankle and joints of right foot    Plan: I evaluated the spine and SI joint, she likely has some transverse stress across the SI joint given her extensive fusion from T10-S1.  Pain is predominantly over the right SI joint.  Through shared decision making, elected to proceed with ultrasound-guided SI joint injection, she tolerated this well and had good relief only minutes following the injection.  Recommended continued core strengthening and home exercises.  She has her oxycodone to take as needed which she takes chronically. She also is inquiring about right foot pain, I did independently review her x-ray which shows rather significant midfoot degenerative change.  Also has a pain in the second metatarsal.  She would like to follow-up in 2  weeks for evaluation of the foot and we can consider an ultrasound-guided midfoot injection at that time.  Follow-up: Return for Follow-up for foot pain (wait at least 2 weeks from SI injection) and injection.   Meds & Orders: No orders of the defined types were placed in this encounter.   Orders Placed This Encounter  Procedures   US Guided Needle Placement - No Linked Charges     Procedures: U/S-guided SI-joint injection, Right   After discussion of risk/benefits/indications, informed verbal consent was obtained. A timeout was then performed. The patient was positioned in a prone position on exam room table with a pillow placed under the pelvis for mild hip flexion. The SI joint area was cleaned and prepped with chlora-prep and alcohol swabs. Sterile ultrasound gel was applied and the ultrasound transducer was placed in an anatomic axial plane over the PSIS, then moved distally over the SI-joint. Using ultrasound guidance, a 22-gauge, 3.5" needle was inserted from a medial to lateral approach utilizing an in-plane approach and directed into the SI-joint. The SI-joint was then injected with a mixture of 4:2 lidocaine:depomedrol with visualization of the injectate flow into the SI-joint under ultrasound visualization. The patient tolerated the procedure well without immediate complications.       Clinical History: No specialty comments available.  She reports that she quit smoking about 31 years ago. Her smoking use included cigarettes. She has a 6.00 pack-year smoking history.  She has been exposed to tobacco smoke. She has never used smokeless tobacco. No results for input(s): "HGBA1C", "LABURIC" in the last 8760 hours.  Objective:   Vital Signs: There were no vitals taken for this visit.  Physical Exam  Gen: Well-appearing, in no acute distress; non-toxic CV: Regular Rate. Well-perfused. Warm.  Resp: Breathing unlabored on room air; no wheezing. Psych: Fluid speech in conversation;  appropriate affect; normal thought process Neuro: Sensation intact throughout. No gross coordination deficits.   Ortho Exam - Lumbar/SI/Hips: Well-healed surgical scar of the posterior back from T10-S1.  No overlying skin changes or redness.  There is positive TTP over the right SI joint.  Positive Fortin's point test, + FABER.  Walks with the assistance of a cane.  Imaging:  *Independent review of lumbar x-rays from 11/02/2021 shows surgical fusion from T10-S1.  Narrative & Impression  CLINICAL DATA:  Pain right foot   EXAM: RIGHT FOOT COMPLETE - 3+ VIEW   COMPARISON:  11/30/2020   FINDINGS: No recent fracture or dislocation is seen. There is previous osteotomy in the head of the first metatarsal. There is a surgical pin in the second metatarsal. Deformities are seen in the PIP joints of second and third toes with no significant interval change. Degenerative changes are noted in intertarsal and tarsometatarsal joints. Small plantar spur is seen in calcaneus. Overall, no significant interval changes are noted.   IMPRESSION: No recent fracture or dislocation is seen in right foot.   Other chronic findings as described in the body of the report.     Electronically Signed   By: Elmer Picker M.D.   On: 08/19/2021 13:51    Past Medical/Family/Surgical/Social History: Medications & Allergies reviewed per EMR, new medications updated. Patient Active Problem List   Diagnosis Date Noted   PVC's (premature ventricular contractions) 10/11/2021   Factor V Leiden mutation ([place]) 10/11/2021   Other secondary scoliosis, lumbar region    Other spondylosis with radiculopathy, lumbar region    Spondylolisthesis, lumbar region    Thoracic spondylosis with radiculopathy    Radiculopathy, lumbar region    Breakdown (mechanical) of implanted electronic neurostimulator of spinal cord electrode (lead), sequela    Degenerative disc disease, lumbar    At risk for adverse drug event  08/31/2021   Acute on chronic blood loss anemia 08/28/2021   Drug-induced constipation 08/28/2021   GERD without esophagitis 08/28/2021   Overactive bladder 08/28/2021   Chronic migraine without aura 08/28/2021   Hx of blood clots 08/28/2021   Major neurocognitive disorder ([place]) 08/28/2021   Status post lumbar spinal fusion 08/21/2021   Weight gain 07/13/2021   Encounter for removal of internal fixation device 10/21/2020   LFTs abnormal 03/02/2020   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   Gastroesophageal reflux disease 09/22/2019   SSBE (short-segment Barrett's esophagus) 09/22/2019   Flatulence 09/22/2019   Migraine 11/11/2018   Abdominal pain, epigastric 10/14/2018   LUQ pain 10/14/2018   Attention deficit hyperactivity disorder (ADHD) 01/12/2018   Insomnia 01/12/2018   Elevated liver enzymes 09/10/2017   History of diabetes mellitus 06/06/2017   Primary osteoarthritis of right hand 06/06/2017   Transaminasemia    TIA (transient ischemic attack) 05/01/2017   Dysphasia 05/01/2017   Chronic pain syndrome 05/01/2017   Confusion    Presence of right artificial knee joint 09/17/2016   H/O total knee replacement, right 08/15/2016   Presence of retained hardware    Memory disorder 08/14/2016   Cerebrovascular disease 08/14/2016   Unilateral primary  osteoarthritis, right knee 05/29/2016   Primary osteoarthritis of both feet 05/29/2016   Trochanteric bursitis of both hips 05/25/2016   Neck pain 05/25/2016   Urinary urgency 04/19/2016   Chronic low back pain 01/11/2016   Total knee replacement status 03/23/2015   Heart palpitations 12/14/2013   Generalized osteoarthritis of multiple sites 11/04/2013   Elevated LFTs 03/17/2012   Cerebral infarction ([place]) 10/30/2011   PFO (patent foramen ovale) 10/30/2011   Acquired hypothyroidism    Thrombophlebitis    OSA on CPAP    Fibromyalgia    Status post bariatric surgery 12/07/2010   Vein disorder  11/29/2010   S/P total hysterectomy and bilateral salpingo-oophorectomy 11/29/2010   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   S/P cholecystectomy 11/29/2010   S/P ACL surgery 11/29/2010   Morbid obesity ([place]) 11/10/2010   Past Medical History:  Diagnosis Date   ADD (attention deficit disorder)    Arthritis    "all over"   Cerebral infarction ([place]) 10/30/2011   Cerebrovascular disease 08/14/2016   Chronic back pain    "all over"   Chronic low back pain 54/56/2563   Complication of anesthesia    tends to have hypotension when NPO and post-anesthesia   Constipation    takes stool softener daily   Degenerative disk disease    Degenerative joint disease    DVT (deep venous thrombosis) ([place]) 2014   RLE   Family history of adverse reaction to anesthesia    a family member woke up during surgery; "think it was my mom"   Fibromyalgia    Generalized osteoarthritis of multiple sites 11/04/2013   GERD (gastroesophageal reflux disease)    Headache    Heart palpitations 12/14/2013   resolved   History of blood clots    superficial   Hypoglycemia    Hypothyroid    takes Synthroid daily   Incomplete emptying of bladder    Insomnia    takes Trazodone nightly   Iron deficiency anemia    takes Ferrous Sulfate daily   Joint pain    Joint swelling    knees and ankles   Memory disorder 08/14/2016   Morbid obesity ([place])    Nausea    takes Zofran as needed.Seeing GI doc   Neck pain 05/25/2016   OSA on CPAP    tested more than 5 yrs ago.     Osteoarthritis    Osteopenia    in feet   PFO (patent foramen ovale)    no murmur   Pre-diabetes    Primary osteoarthritis of both feet 05/29/2016   Right bunionectomy August 2017 by Dr. Sharol Given   PVC's (premature ventricular contractions) 10/11/2021   Monitor 9/23: 1.8% PVCs; rare PACs; o/w NSR   Scoliosis    Skin abnormality 02/04/2020   raised area on lip   Stroke [facility]) "several"last 2014   right foot weakness; memory issues,  black spot right visual field since" (03/23/2015)   Thrombophlebitis    Trochanteric bursitis of both hips 05/25/2016   Unilateral primary osteoarthritis, right knee 05/29/2016   Urinary urgency 04/19/2016   Vein disorder 11/29/2010   varicose veins both legs   Wears glasses    Family History  Problem Relation Age of Onset   Cancer Mother        skin and breast   Myasthenia gravis Mother    Breast cancer Mother    Heart disease Father    Cancer Father    Parkinson's disease Father    Heart  disease Sister    Heart attack Sister    Heart disease Brother    Cancer Brother    Diabetes Brother    Stroke Brother    Cancer Maternal Grandfather    Hypothyroidism Daughter    Hypertension Other    Colon cancer Neg Hx    Past Surgical History:  Procedure Laterality Date   BIOPSY  11/14/2018   Procedure: BIOPSY;  Surgeon: Rogene Houston, MD;  [facility];  Service: Endoscopy;;  esophagusgastric   BONE EXCISION Right 08/29/2017   Procedure: right trapezium excision;  Surgeon: Daryll Brod, MD;  Location: [place];  Service: Orthopedics;  Laterality: Right;   BUNIONECTOMY Right 08/2015   CARDIAC CATHETERIZATION     2008.  "it was fine" (not sure why she had it done, and doesn't know where)   CARPOMETACARPEL SUSPENSION PLASTY Right 08/29/2017   Procedure: SUSPENSION PLASTY RIGHT THUMB;  Surgeon: Daryll Brod, MD;  Location: [place];  Service: Orthopedics;  Laterality: Right;   COLONOSCOPY N/A 03/25/2013   Procedure: COLONOSCOPY;  Surgeon: Rogene Houston, MD;  [facility];  Service: Endoscopy;  Laterality: N/A;  930   ERCP  06/02/2020   ESOPHAGOGASTRODUODENOSCOPY     ESOPHAGOGASTRODUODENOSCOPY (EGD) WITH PROPOFOL N/A 11/14/2018   Procedure: ESOPHAGOGASTRODUODENOSCOPY (EGD) WITH PROPOFOL;  Surgeon: Rogene Houston, MD;  [facility];  Service: Endoscopy;  Laterality: N/A;  1:55pm-office moved to 11:00am/pt notified to arrive  at 9:30am per [place]     "took fallopian tubes out"   [place] Right 02/12/2020   Procedure: HALLUX INTERPHANGEAL JOINT  FUSION;  Surgeon: Felipa Furnace, DPM;  Location: [place];  Service: Podiatry;  Laterality: Right;   HAMMER TOE SURGERY Right 02/12/2020   Procedure: SECOND AND THIRD HAMMER TOE CORRECTION; CAPSULOTOMY SECOND INTERPHALANGEAL JOINT;  Surgeon: Felipa Furnace, DPM;  Location: [place];  Service: Podiatry;  Laterality: Right;   JOINT REPLACEMENT     bil knee    KNEE ARTHROSCOPY Left    KNEE ARTHROSCOPY W/ ACL RECONSTRUCTION Right yrs ago   "added pins"   LAPAROSCOPIC CHOLECYSTECTOMY  ~ 2001   ROUX-EN-Y GASTRIC BYPASS  11/20/2010   [place]   SPINAL CORD STIMULATOR INSERTION N/A 04/18/2017   Procedure: LUMBAR SPINAL CORD STIMULATOR INSERTION;  Surgeon: Clydell Hakim, MD;  Location: [place];  Service: Neurosurgery;  Laterality: N/A;  LUMBAR SPINAL CORD STIMULATOR INSERTION   stomach stent  04/28/2020   TENDON TRANSFER Right 08/29/2017   Procedure: right abductor pollicis longus transfer;  Surgeon: Daryll Brod, MD;  Location: [place];  Service: Orthopedics;  Laterality: Right;   TOTAL KNEE ARTHROPLASTY Left 03/23/2015   Procedure: TOTAL KNEE ARTHROPLASTY;  Surgeon: Newt Minion, MD;  Location: [place];  Service: Orthopedics;  Laterality: Left;   TOTAL KNEE ARTHROPLASTY Right 08/15/2016   Procedure: RIGHT TOTAL KNEE ARTHROPLASTY, REMOVAL ACL SCREWS;  Surgeon: Newt Minion, MD;  Location: [place];  Service: Orthopedics;  Laterality: Right;   TOTAL KNEE ARTHROPLASTY WITH HARDWARE REMOVAL Right    VAGINAL HYSTERECTOMY     tah/bso   VARICOSE VEIN SURGERY Right X 2   WEIL OSTEOTOMY Right 02/12/2020   Procedure: DOUBLE L OSTEOTOMY;  Surgeon: Felipa Furnace, DPM;  Location: [place];  Service: Podiatry;  Laterality: Right;   Social History   Occupational History   Occupation: Disability    Occupation: formerly  RN, HHN  Tobacco Use   Smoking status: Former    Packs/day: 0.75    Years: 8.00    Total pack years: 6.00    Types: Cigarettes    Quit date: 12/01/1990    Years since quitting: 31.0    Passive exposure: Past   Smokeless tobacco: Never   Tobacco comments:    quit smoking in the 1990s  Vaping Use   Vaping Use: Never used  Substance and Sexual Activity   Alcohol use: No    Comment: 03/23/2015 "stopped drinking in 2012 w/gastric bypass; drank socially before bypass"   Drug use: No   Sexual activity: Not Currently    Birth control/protection: Surgical

## 2021-11-25 DIAGNOSIS — G4733 Obstructive sleep apnea (adult) (pediatric): Secondary | ICD-10-CM | POA: Diagnosis not present

## 2021-11-28 ENCOUNTER — Other Ambulatory Visit: Payer: Self-pay | Admitting: Adult Health

## 2021-11-29 ENCOUNTER — Encounter: Payer: Self-pay | Admitting: Psychiatry

## 2021-11-30 ENCOUNTER — Ambulatory Visit (INDEPENDENT_AMBULATORY_CARE_PROVIDER_SITE_OTHER): Payer: PPO

## 2021-11-30 ENCOUNTER — Ambulatory Visit (INDEPENDENT_AMBULATORY_CARE_PROVIDER_SITE_OTHER): Payer: PPO | Admitting: Orthopedic Surgery

## 2021-11-30 ENCOUNTER — Encounter: Payer: Self-pay | Admitting: Orthopedic Surgery

## 2021-11-30 ENCOUNTER — Other Ambulatory Visit: Payer: Self-pay | Admitting: Neurology

## 2021-11-30 VITALS — BP 139/84 | HR 80 | Ht 64.5 in | Wt 240.0 lb

## 2021-11-30 DIAGNOSIS — M533 Sacrococcygeal disorders, not elsewhere classified: Secondary | ICD-10-CM

## 2021-11-30 DIAGNOSIS — Z981 Arthrodesis status: Secondary | ICD-10-CM | POA: Diagnosis not present

## 2021-11-30 DIAGNOSIS — G8929 Other chronic pain: Secondary | ICD-10-CM

## 2021-11-30 MED ORDER — METAFOLBIC PLUS 6-2-600 MG PO TABS: 1.0000 | ORAL_TABLET | Freq: Every day | ORAL | 3 refills | Status: DC

## 2021-11-30 NOTE — Progress Notes (Signed)
Orthopedic Spine Surgery Office Note  Assessment: Patient is a 61 y.o. female who underwent T10-S1 PSIF and multi-level TLIF with my partner in 07/2021 who is doing well after surgery.  Reports symptomatic improvement since surgery   Plan: -She can continue to use a brace as needed when she is being more active but should not be using it routinely -She can be out of bed as tolerated and start activity as tolerated -There are no further restrictions in terms of bending/lifting/twisting -Patient should return to office in 12 weeks, repeat x-rays of lumbar spine at next visit: scoli films   Patient expressed understanding of the plan and all questions were answered to the patient's satisfaction.   ___________________________________________________________________________   History:  Patient is a 61 y.o. female who presents today for follow up after T10-S1 PSIF and multi-level TLIF with my partner in July 2023.  Patient was having some right-sided SI joint pain at her last visit with Dr. Louanne Skye.  She did get a recent injection and reports that her symptoms have improved.  She feels some right-sided leg weakness at times.  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  She uses a cane to help with this.  She has not noticed any new weakness since the surgery.  She sometimes feels off balance as a result of this weakness.  Has chronic incontinence with no recent changes.   Weakness: Yes, left leg.  No other weakness Symptoms of imbalance: Yes, because of the left leg weakness Paresthesias and numbness: Denies Bowel or bladder incontinence: Yes, chronic no new changes Saddle anesthesia: Denies  Review of systems: Denies fevers and chills, night sweats, unexplained weight loss, history of cancer, pain that wakes them at night  Past medical history: Psoriasis Migraines Fibromyalgia Stroke GERD Irritable bowel syndrome Neuropathy Borderline diabetes Sleep apnea Chronic  pain PFO  Allergies: Lyrica, belsomra, morphine, Bactrim, tape  Past surgical history:  Bilateral TKA Bunionectomy Hand surgery Laparotomy Hammertoe surgery Gastric bypass Cholecystectomy Tendon transfer Spinal cord stimulator and subsequent removal T10-S1 posterior spinal fusion with multilevel TLIF Varicose vein surgery Hysterectomy Weil osteotomy   Social history: Denies use of nicotine product (smoking, vaping, patches, smokeless) Alcohol use: Denies Denies recreational drug use   Physical Exam:  General: no acute distress, appears stated age Neurologic: alert, answering questions appropriately, following commands Respiratory: unlabored breathing on room air, symmetric chest rise Psychiatric: appropriate affect, normal cadence to speech   MSK (spine):  -Strength exam      Left  Right EHL    5/5  0/5 TA    5/5  5/5 GSC    5/5  5/5 Knee extension  5/5  5/5 Hip flexion   5/5  5/5  Had MtP fusion on the right  -Sensory exam    Sensation intact to light touch in L3-S1 nerve distributions of bilateral lower extremities  -Achilles DTR: 1/4 on the left, 1/4 on the right -Patellar tendon DTR: 1/4 on the left, 1/4 on the right  -Straight leg raise: Negative -Contralateral straight leg raise: Negative -Clonus: no beats bilaterally  -Left hip exam: no pain through range of motion -Right hip exam: no pain through range of motion -Incision appears well-healed with no evidence of infection  Imaging: XR of the lumbar spine from 11/30/2021 was independently reviewed and interpreted, showing instrumentation in place with no evidence of screw complication. The lower TLIF cages appear to have subsided. Lucency around the L4/5 interbody device.  Patient name: Debbie Pineda Patient MRN: [id number] Date of visit: 11/30/21

## 2021-12-06 DIAGNOSIS — K316 Fistula of stomach and duodenum: Secondary | ICD-10-CM | POA: Diagnosis not present

## 2021-12-06 DIAGNOSIS — Z8673 Personal history of transient ischemic attack (TIA), and cerebral infarction without residual deficits: Secondary | ICD-10-CM | POA: Diagnosis not present

## 2021-12-06 DIAGNOSIS — Z980 Intestinal bypass and anastomosis status: Secondary | ICD-10-CM | POA: Diagnosis not present

## 2021-12-06 DIAGNOSIS — E039 Hypothyroidism, unspecified: Secondary | ICD-10-CM | POA: Diagnosis not present

## 2021-12-07 ENCOUNTER — Ambulatory Visit: Payer: PPO | Admitting: Sports Medicine

## 2021-12-11 ENCOUNTER — Other Ambulatory Visit: Payer: Self-pay

## 2021-12-11 MED ORDER — NURTEC 75 MG PO TBDP: 75.0000 mg | ORAL_TABLET | Freq: Every day | ORAL | 0 refills | Status: DC | PRN

## 2021-12-11 NOTE — Progress Notes (Signed)
Rx sent 

## 2021-12-12 NOTE — Progress Notes (Signed)
Office Visit Note  Patient: Debbie Pineda             Date of Birth: Jun 30, 1960           MRN: [id number]             PCP: Jake Samples, PA-C Referring: Jake Samples, [place]* Visit Date: 12/26/2021 Occupation: '@GUAROCC'$ @  Subjective:  Neck pain  History of Present Illness: Debbie Pineda is a 61 y.o. female with history of osteoarthritis, degenerative disc disease and fibromyalgia syndrome.  She states she continues to have generalized pain and discomfort from fibromyalgia.  She has been experiencing increased discomfort in the bilateral trapezius region.  She would like to have injections.  She had good response to cortisone injections in the past.  She states she had lumbar spine surgery by Dr. Louanne Skye which was helpful.  Her lower back pain has been better.  She continues to have some thoracic pain.  She continues to have stiffness in her hands and trochanteric region.  She has been going to pain management which helps.  Activities of Daily Living:  Patient reports morning stiffness for 1 hour.   Patient Reports nocturnal pain.  Difficulty dressing/grooming: Reports Difficulty climbing stairs: Reports Difficulty getting out of chair: Reports Difficulty using hands for taps, buttons, cutlery, and/or writing: Denies  Review of Systems  Constitutional:  Positive for fatigue.  HENT:  Positive for mouth dryness.   Eyes:  Positive for dryness.  Respiratory:  Negative for shortness of breath.   Cardiovascular:  Negative for chest pain and palpitations.  Gastrointestinal:  Positive for constipation. Negative for blood in stool and diarrhea.  Endocrine: Negative for increased urination.  Genitourinary:  Negative for difficulty urinating.  Musculoskeletal:  Positive for joint pain, gait problem, joint pain, myalgias, morning stiffness, muscle tenderness and myalgias. Negative for joint swelling.  Skin:  Positive for hair loss. Negative for color change, rash and sensitivity  to sunlight.  Allergic/Immunologic: Negative for susceptible to infections.  Neurological:  Positive for headaches. Negative for dizziness.  Hematological:  Negative for swollen glands.  Psychiatric/Behavioral:  Positive for sleep disturbance. Negative for depressed mood. The patient is not nervous/anxious.     PMFS History:  Patient Active Problem List   Diagnosis Date Noted   PVC's (premature ventricular contractions) 10/11/2021   Factor V Leiden mutation ([place]) 10/11/2021   Other secondary scoliosis, lumbar region    Other spondylosis with radiculopathy, lumbar region    Spondylolisthesis, lumbar region    Thoracic spondylosis with radiculopathy    Radiculopathy, lumbar region    Breakdown (mechanical) of implanted electronic neurostimulator of spinal cord electrode (lead), sequela    Degenerative disc disease, lumbar    At risk for adverse drug event 08/31/2021   Acute on chronic blood loss anemia 08/28/2021   Drug-induced constipation 08/28/2021   GERD without esophagitis 08/28/2021   Overactive bladder 08/28/2021   Chronic migraine without aura 08/28/2021   Hx of blood clots 08/28/2021   Major neurocognitive disorder ([place]) 08/28/2021   Status post lumbar spinal fusion 08/21/2021   Weight gain 07/13/2021   Encounter for removal of internal fixation device 10/21/2020   LFTs abnormal 03/02/2020   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   Gastroesophageal reflux disease 09/22/2019   SSBE (short-segment Barrett's esophagus) 09/22/2019   Flatulence 09/22/2019   Migraine 11/11/2018   Abdominal pain, epigastric 10/14/2018   LUQ pain 10/14/2018   Attention deficit hyperactivity disorder (ADHD) 01/12/2018   Insomnia 01/12/2018  Elevated liver enzymes 09/10/2017   History of diabetes mellitus 06/06/2017   Primary osteoarthritis of right hand 06/06/2017   Transaminasemia    TIA (transient ischemic attack) 05/01/2017   Dysphasia 05/01/2017    Chronic pain syndrome 05/01/2017   Confusion    Presence of right artificial knee joint 09/17/2016   H/O total knee replacement, right 08/15/2016   Presence of retained hardware    Memory disorder 08/14/2016   Cerebrovascular disease 08/14/2016   Unilateral primary osteoarthritis, right knee 05/29/2016   Primary osteoarthritis of both feet 05/29/2016   Trochanteric bursitis of both hips 05/25/2016   Neck pain 05/25/2016   Urinary urgency 04/19/2016   Chronic low back pain 01/11/2016   Total knee replacement status 03/23/2015   Heart palpitations 12/14/2013   Generalized osteoarthritis of multiple sites 11/04/2013   Elevated LFTs 03/17/2012   Cerebral infarction ([place]) 10/30/2011   PFO (patent foramen ovale) 10/30/2011   Acquired hypothyroidism    Thrombophlebitis    OSA on CPAP    Fibromyalgia    Status post bariatric surgery 12/07/2010   Vein disorder 11/29/2010   S/P total hysterectomy and bilateral salpingo-oophorectomy 11/29/2010   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   S/P cholecystectomy 11/29/2010   S/P ACL surgery 11/29/2010   Morbid obesity ([place]) 11/10/2010    Past Medical History:  Diagnosis Date   ADD (attention deficit disorder)    Arthritis    "all over"   Cerebral infarction ([place]) 10/30/2011   Cerebrovascular disease 08/14/2016   Chronic back pain    "all over"   Chronic low back pain 99/24/2683   Complication of anesthesia    tends to have hypotension when NPO and post-anesthesia   Constipation    takes stool softener daily   Degenerative disk disease    Degenerative joint disease    DVT (deep venous thrombosis) ([place]) 2014   RLE   Family history of adverse reaction to anesthesia    a family member woke up during surgery; "think it was my mom"   Fibromyalgia    Generalized osteoarthritis of multiple sites 11/04/2013   GERD (gastroesophageal reflux disease)    Headache    Heart palpitations 12/14/2013   resolved   History of blood clots     superficial   Hypoglycemia    Hypothyroid    takes Synthroid daily   Incomplete emptying of bladder    Insomnia    takes Trazodone nightly   Iron deficiency anemia    takes Ferrous Sulfate daily   Joint pain    Joint swelling    knees and ankles   Memory disorder 08/14/2016   Morbid obesity ([place])    Nausea    takes Zofran as needed.Seeing GI doc   Neck pain 05/25/2016   OSA on CPAP    tested more than 5 yrs ago.     Osteoarthritis    Osteopenia    in feet   PFO (patent foramen ovale)    no murmur   Pre-diabetes    Primary osteoarthritis of both feet 05/29/2016   Right bunionectomy August 2017 by Dr. Sharol Given   PVC's (premature ventricular contractions) 10/11/2021   Monitor 9/23: 1.8% PVCs; rare PACs; o/w NSR   Scoliosis    Skin abnormality 02/04/2020   raised area on lip   Stroke [facility]) "several"last 2014   right foot weakness; memory issues, black spot right visual field since" (03/23/2015)   Thrombophlebitis    Trochanteric bursitis of both hips 05/25/2016  Unilateral primary osteoarthritis, right knee 05/29/2016   Urinary urgency 04/19/2016   Vein disorder 11/29/2010   varicose veins both legs   Wears glasses     Family History  Problem Relation Age of Onset   Cancer Mother        skin and breast   Myasthenia gravis Mother    Breast cancer Mother    Heart disease Father    Cancer Father    Parkinson's disease Father    Heart disease Sister    Heart attack Sister    Heart disease Brother    Cancer Brother    Diabetes Brother    Stroke Brother    Cancer Maternal Grandfather    Hypothyroidism Daughter    Hypertension Other    Colon cancer Neg Hx    Past Surgical History:  Procedure Laterality Date   BIOPSY  11/14/2018   Procedure: BIOPSY;  Surgeon: Rogene Houston, MD;  [facility];  Service: Endoscopy;;  esophagusgastric   BONE EXCISION Right 08/29/2017   Procedure: right trapezium excision;  Surgeon: Daryll Brod, MD;  Location: [place];  Service: Orthopedics;  Laterality: Right;   BUNIONECTOMY Right 08/2015   CARDIAC CATHETERIZATION     2008.  "it was fine" (not sure why she had it done, and doesn't know where)   CARPOMETACARPEL SUSPENSION PLASTY Right 08/29/2017   Procedure: SUSPENSION PLASTY RIGHT THUMB;  Surgeon: Daryll Brod, MD;  Location: [place];  Service: Orthopedics;  Laterality: Right;   COLONOSCOPY N/A 03/25/2013   Procedure: COLONOSCOPY;  Surgeon: Rogene Houston, MD;  [facility];  Service: Endoscopy;  Laterality: N/A;  930   ERCP  06/02/2020   ESOPHAGOGASTRODUODENOSCOPY     ESOPHAGOGASTRODUODENOSCOPY (EGD) WITH PROPOFOL N/A 11/14/2018   Procedure: ESOPHAGOGASTRODUODENOSCOPY (EGD) WITH PROPOFOL;  Surgeon: Rogene Houston, MD;  [facility];  Service: Endoscopy;  Laterality: N/A;  1:55pm-office moved to 11:00am/pt notified to arrive at 9:30am per [place]     "took fallopian tubes out"   [place] Right 02/12/2020   Procedure: HALLUX INTERPHANGEAL JOINT  FUSION;  Surgeon: Felipa Furnace, DPM;  Location: [place];  Service: Podiatry;  Laterality: Right;   HAMMER TOE SURGERY Right 02/12/2020   Procedure: SECOND AND THIRD HAMMER TOE CORRECTION; CAPSULOTOMY SECOND INTERPHALANGEAL JOINT;  Surgeon: Felipa Furnace, DPM;  Location: [place];  Service: Podiatry;  Laterality: Right;   JOINT REPLACEMENT     bil knee    KNEE ARTHROSCOPY Left    KNEE ARTHROSCOPY W/ ACL RECONSTRUCTION Right yrs ago   "added pins"   LAPAROSCOPIC CHOLECYSTECTOMY  ~ 2001   ROUX-EN-Y GASTRIC BYPASS  11/20/2010   [place]   SPINAL CORD STIMULATOR INSERTION N/A 04/18/2017   Procedure: LUMBAR SPINAL CORD STIMULATOR INSERTION;  Surgeon: Clydell Hakim, MD;  Location: [place];  Service: Neurosurgery;  Laterality: N/A;  LUMBAR SPINAL CORD STIMULATOR INSERTION   stomach stent  04/28/2020   TENDON TRANSFER Right 08/29/2017   Procedure: right  abductor pollicis longus transfer;  Surgeon: Daryll Brod, MD;  Location: [place];  Service: Orthopedics;  Laterality: Right;   TOTAL KNEE ARTHROPLASTY Left 03/23/2015   Procedure: TOTAL KNEE ARTHROPLASTY;  Surgeon: Newt Minion, MD;  Location: [place];  Service: Orthopedics;  Laterality: Left;   TOTAL KNEE ARTHROPLASTY Right 08/15/2016   Procedure: RIGHT TOTAL KNEE ARTHROPLASTY, REMOVAL ACL SCREWS;  Surgeon: Newt Minion,  MD;  Location: [place];  Service: Orthopedics;  Laterality: Right;   TOTAL KNEE ARTHROPLASTY WITH HARDWARE REMOVAL Right    VAGINAL HYSTERECTOMY     tah/bso   VARICOSE VEIN SURGERY Right X 2   WEIL OSTEOTOMY Right 02/12/2020   Procedure: DOUBLE L OSTEOTOMY;  Surgeon: Felipa Furnace, DPM;  Location: [place];  Service: Podiatry;  Laterality: Right;   Social History   Social History Narrative   Lives with husband   Caffeine use: No soda   Mainly water, drinks decaf tea   Right handed   Immunization History  Administered Date(s) Administered   Influenza Split 11/01/2011, 08/23/2014   Influenza-Unspecified 08/23/2014, 11/22/2017   Moderna SARS-COV2 Booster Vaccination 12/16/2019, 12/31/2020   Moderna Sars-Covid-2 Vaccination 03/16/2019, 04/04/2019, 04/13/2019, 05/15/2019   Pneumococcal Polysaccharide-23 04/26/2017   Zoster Recombinat (Shingrix) 07/02/2017, 01/08/2018     Objective: Vital Signs: BP 106/71 (BP Location: Left Arm, Patient Position: Sitting, Cuff Size: Large)   Pulse 75   Resp 17   Ht '5\' 5"'$  (1.651 m)   Wt 256 lb (116.1 kg)   BMI 42.60 kg/m    Physical Exam Vitals and nursing note reviewed.  Constitutional:      Appearance: She is well-developed.  HENT:     Head: Normocephalic and atraumatic.  Eyes:     Conjunctiva/sclera: Conjunctivae normal.  Cardiovascular:     Rate and Rhythm: Normal rate and regular rhythm.     Heart sounds: Normal heart sounds.  Pulmonary:     Effort: Pulmonary effort is normal.      Breath sounds: Normal breath sounds.  Abdominal:     General: Bowel sounds are normal.     Palpations: Abdomen is soft.  Musculoskeletal:     Cervical back: Normal range of motion.  Lymphadenopathy:     Cervical: No cervical adenopathy.  Skin:    General: Skin is warm and dry.     Capillary Refill: Capillary refill takes less than 2 seconds.  Neurological:     Mental Status: She is alert and oriented to person, place, and time.  Psychiatric:        Behavior: Behavior normal.      Musculoskeletal Exam: Cervical spine was in good range of motion.  She had spasm in the bilateral trapezius region.  She had discomfort range of motion of thoracic and lumbar spine.  Shoulder joints, elbow joints, wrist joints, MCPs PIPs and DIPs been good range of motion with no synovitis.  She has mild thickening of bilateral PIP and DIP joints.  Hip joints and knee joints with good range of motion.  There was no tenderness over ankles or MTPs.  CDAI Exam: CDAI Score: -- Patient Global: --; Provider Global: -- Swollen: --; Tender: -- Joint Exam 12/26/2021   No joint exam has been documented for this visit   There is currently no information documented on the homunculus. Go to the Rheumatology activity and complete the homunculus joint exam.  Investigation: No additional findings.  Imaging: No results found.  Recent Labs: Lab Results  Component Value Date   WBC 9.5 09/06/2021   HGB 12.9 09/06/2021   PLT 365 09/06/2021   NA 143 10/11/2021   K 4.6 10/11/2021   CL 103 10/11/2021   CO2 24 10/11/2021   GLUCOSE 96 10/11/2021   BUN 11 10/11/2021   CREATININE 0.93 10/11/2021   BILITOT 0.9 09/06/2021   ALKPHOS 150 (H) 09/06/2021   AST 17 09/06/2021   ALT 13 09/06/2021  PROT 7.1 09/06/2021   ALBUMIN 3.7 09/06/2021   CALCIUM 9.5 10/11/2021   GFRAA 81 10/14/2018    Speciality Comments: No specialty comments available.  Procedures:  Trigger Point Inj  Date/Time: 12/26/2021 3:09  PM  Performed by: Bo Merino, MD Authorized by: Bo Merino, MD   Consent Given by:  Patient Site marked: the procedure site was marked   Timeout: prior to procedure the correct patient, procedure, and site was verified   Indications:  Muscle spasm and pain Total # of Trigger Points:  2 Location: neck   Needle Size:  27 G Approach:  Dorsal Medications #1:  0.5 mL lidocaine 1 %; 10 mg triamcinolone acetonide 40 MG/ML Medications #2:  0.5 mL lidocaine 1 %; 10 mg triamcinolone acetonide 40 MG/ML Patient tolerance:  Patient tolerated the procedure well with no immediate complications  Allergies: Lyrica [pregabalin], Belsomra [suvorexant], Morphine and related, Sulfamethoxazole-trimethoprim, and Tape   Assessment / Plan:     Visit Diagnoses: Fibromyalgia-she continues to have generalized pain and positive tender points.  She had generalized hyperalgesia on the examination today.  She has been going to pain management which has been helpful.  Her lower back pain has improved since the surgery.  She continues to take muscle relaxers, Cymbalta and Percocet along with gabapentin to pain management.  Trapezius muscle spasm-she had bilateral trapezius spasm.  She had good response to cortisone injections in the past.  Per her request bilateral trapezius region were injected with lidocaine and Kenalog as described above.  She tolerated the procedure well.  Postprocedure instructions were given.  She will continue muscle relaxers.  A handout on neck exercises was given.  Primary osteoarthritis of both hands-she had bilateral PIP and DIP thickening consistent with osteoarthritis.  Joint protection muscle strengthening was discussed.  Trochanteric bursitis of both hips-she continues to have some trochanteric bursitis.  IT band stretches were demonstrated and discussed.  History of total bilateral knee replacement-she had good range of motion without any warmth swelling or  effusion.  Primary osteoarthritis of both feet -she is not seeing a podiatrist anymore.  Proper fitting shoes were advised.  DDD (degenerative disc disease), lumbar -she had surgery by Dr. Louanne Skye which was very helpful.  She is not having intense pain anymore.  She has intermittent discomfort which is manageable.  She has been mobilizing with the help of a cane.  DDD (degenerative disc disease), thoracic-she has noticed improvement in thoracic discomfort after the lumbar spine surgery.  History of diabetes mellitus-advised her to monitor her glucose level closely after the cortisone injection.  Other medical problems listed as follows:  History of thrombophlebitis  History of sleep apnea  PFO (patent foramen ovale)  History of gastric bypass  History of cerebral infarction  Orders: Orders Placed This Encounter  Procedures   Trigger Point Inj   No orders of the defined types were placed in this encounter.    Follow-Up Instructions: Return in about 6 months (around 06/27/2022) for FMS, OA.   Bo Merino, MD  Note - This record has been created using Editor, commissioning.  Chart creation errors have been sought, but may not always  have been located. Such creation errors do not reflect on  the standard of medical care.

## 2021-12-19 ENCOUNTER — Encounter: Payer: Self-pay | Admitting: Psychiatry

## 2021-12-19 ENCOUNTER — Other Ambulatory Visit: Payer: Self-pay | Admitting: Gastroenterology

## 2021-12-19 ENCOUNTER — Ambulatory Visit: Payer: PPO | Admitting: Psychiatry

## 2021-12-19 VITALS — BP 131/86 | HR 101 | Ht 65.0 in | Wt 251.0 lb

## 2021-12-19 DIAGNOSIS — R413 Other amnesia: Secondary | ICD-10-CM

## 2021-12-19 DIAGNOSIS — G43709 Chronic migraine without aura, not intractable, without status migrainosus: Secondary | ICD-10-CM | POA: Diagnosis not present

## 2021-12-19 DIAGNOSIS — K5903 Drug induced constipation: Secondary | ICD-10-CM

## 2021-12-19 MED ORDER — UBRELVY 100 MG PO TABS: 100.0000 mg | ORAL_TABLET | ORAL | 0 refills | Status: DC | PRN

## 2021-12-19 NOTE — Progress Notes (Signed)
   CC:  headaches, memory loss  Follow-up Visit  Last visit: 06/29/21  Brief HPI: 61 year old female with a history of hypothyroidism, MTHFR deficiency, remote CVA, seizure, migraines, fibromyalgia who follows in clinic for headaches, seizures, foot pain, and memory loss.   At her last visit, Th[place]xton was ordered. Emgality and Nurtec were continued for migraines. Topamax was continued for headache and seizures. Gabapentin was continued for foot pain. Donepezil was continued for memory loss.   Interval History: She continues to have headaches about 1 week before she is due for her Emgality injection.  Nurtec helps but does not last long.  Memory is doing well on donepezil. She feels like it has improved since her last visit.  Foot pain has improve since her lumbar fusion. She is also getting a shot in her foot tomorrow to see if this will help with her pain.  No new episodes concerning for seizure. Last episode concerning for seizure was 2 years ago.   [facility] 07/04/21 showed her old left occipital infarct with no acute process. She has not noticed any vision issues since her last visit.  Prior headache therapies: Prevention: Topamax 100 mg BID Cymbalta 60 mg BID Effexor Amitriptyline Gabapentin 800 mg TID Lyrica Aimovig Ajovy Emgality 120 mg monthly  Rescue: Lidocaine patches Tizanidine 4 mg PRN Nurtec 75 mg PRN Zofran 4 mg PRN  Physical Exam:   Vital Signs: BP 131/86   Pulse (!) 101   Ht '5\' 5"'$  (1.651 m)   Wt 251 lb (113.9 kg)   BMI 41.77 kg/m  GENERAL:  well appearing, in no acute distress, alert  SKIN:  Color, texture, turgor normal. No rashes or lesions HEAD:  Normocephalic/atraumatic. RESP: normal respiratory effort MSK:  No gross joint deformities.   NEUROLOGICAL: Mental Status: Alert, oriented to person, place and time, Follows commands, and Speech fluent and appropriate. Cranial Nerves: PERRL, face symmetric, no dysarthria, hearing grossly intact Motor: moves all  extremities equally Gait: normal-based.  IMPRESSION: hypothyroidism, MTHFR deficiency, remote CVA, seizures, migraines, fibromyalgia who presents for follow up of headaches, memory loss, and foot pain. She is doing well other than experiencing wearing off headaches the week before she is due for her Emgality injection. Nurtec does help but the effect is short-lasting. Sample of Roselyn Meier provided today. If this is helpful will send in a prescription.  PLAN:  Migraines: -Continue Emgality for prevention  -Continue Nurtec for now. Ubrelvy sample provided today. Will send in rx if this is helpful. -Consider propranolol, Botox, Qulipta as next steps   Memory loss: -Continue donepezil 10 mg daily   Seizures: -Continue Topamax 100 mg BID for now. If headaches remain stable could consider weaning as she has not had an episode concerning for seizure in the past 2 years   Foot pain: -Continue gabapentin 800 mg TID for now. May consider weaning if foot pain is well-controlled with lumbar fusion and foot injections  Follow-up: 6 months  I spent a total of 22 minutes on the date of the service. Discussed medication side effects, adverse reactions and drug interactions. Written educational materials and patient instructions outlining all of the above were given.  Genia Harold, MD 12/19/21 2:53 PM

## 2021-12-20 ENCOUNTER — Ambulatory Visit (INDEPENDENT_AMBULATORY_CARE_PROVIDER_SITE_OTHER): Payer: PPO | Admitting: Sports Medicine

## 2021-12-20 ENCOUNTER — Encounter: Payer: Self-pay | Admitting: Sports Medicine

## 2021-12-20 ENCOUNTER — Ambulatory Visit: Payer: Self-pay

## 2021-12-20 DIAGNOSIS — M2141 Flat foot [pes planus] (acquired), right foot: Secondary | ICD-10-CM | POA: Diagnosis not present

## 2021-12-20 DIAGNOSIS — M19071 Primary osteoarthritis, right ankle and foot: Secondary | ICD-10-CM | POA: Diagnosis not present

## 2021-12-20 DIAGNOSIS — M25571 Pain in right ankle and joints of right foot: Secondary | ICD-10-CM

## 2021-12-20 DIAGNOSIS — Z9889 Other specified postprocedural states: Secondary | ICD-10-CM

## 2021-12-20 MED ORDER — LIDOCAINE HCL 1 % IJ SOLN
1.0000 mL | INTRAMUSCULAR | Status: AC | PRN
  Administered 2021-12-20: 1 mL

## 2021-12-20 MED ORDER — BETAMETHASONE SOD PHOS & ACET 6 (3-3) MG/ML IJ SUSP
6.0000 mg | INTRAMUSCULAR | Status: AC | PRN
  Administered 2021-12-20: 6 mg via INTRA_ARTICULAR

## 2021-12-20 NOTE — Progress Notes (Signed)
Injections in SI joint helped;   Here today for right foot; injection

## 2021-12-20 NOTE — Progress Notes (Signed)
Debbie Pineda - 61 y.o. female MRN [id number]  Date of birth: 05/29/1960  Office Visit Note: Visit Date: 12/20/2021 PCP: Jake Samples, PA-C Referred by: Jake Samples, PA*  Subjective: Chief Complaint  Patient presents with   Lower Back - Pain, Follow-up   HPI: Debbie Pineda is a pleasant 61 y.o. female who presents today for follow-up of SI-joint pain and right midfoot pain.  SI joint - we did perform a right SI joint injection on 11/23/2021.  She got excellent results from this and is not having any pain.  She did see Dr. Laurance Flatten on 11/30/2021 and both were pleased with her relief.  We will continue her activity as discussed with Dr. Laurance Flatten.  Right midfoot pain -has a complex history of that with prior bunionectomy, prior hammertoe surgeries of 2 and 3 on the right side.  She reports they did have to move the bone of the first metatarsal with the bunionectomy.  Has pain just proximal to this.  Denies any redness or swelling, notes there is pain here.  She does rub topical Voltaren gel over this area which does help to some extent.  In the past tried an arch cushion but this was not comfortable for her.  Unable to take NSAIDs given history of Roux-en-Y gastric bypass, on Xarelto  Pertinent ROS were reviewed with the patient and found to be negative unless otherwise specified above in HPI.   Assessment & Plan: Visit Diagnoses:  1. Arthritis of right midfoot   2. Pain in right ankle and joints of right foot   3. S/P bunionectomy    Plan: Encouraged that she is doing great with the SI joint injection.  She will follow-up with Dr. Laurance Flatten for the back as needed.  I did review her foot x-rays which show rather advanced arthritis of the tarsometatarsal joint as well as some of the other midfoot joints.  Symptomatic in the tarsometatarsal joint.  She is limited in terms of her medication, through shared decision making did elect to proceed with ultrasound-guided injection, she  tolerated this well.  Would recommend ice and activity modification occasions over the next 2 days.  She may then resume back on her Voltaren gel.  Some of this is from her bunionectomy, although she does have a loss of her arches, right worse than left.  We did fit her for some arch supports today.  She will try this for 2 weeks to see if they are comfortable and help support the foot.  She will follow-up with me in 3 weeks for reevaluation.  Follow-up: Return in about 3 weeks (around 01/10/2022) for with Dr. Rolena Infante .   Meds & Orders: No orders of the defined types were placed in this encounter.   Orders Placed This Encounter  Procedures   Small Joint Inj: R intertarsal   US Guided Needle Placement - No Linked Charges     Procedures: Small Joint Inj: R intertarsal on 12/20/2021 9:37 AM Indications: pain Details: 25 G needle, ultrasound-guided dorsal approach  Spinal Needle: No  Medications: 1 mL lidocaine 1 %; 6 mg betamethasone acetate-betamethasone sodium phosphate 6 (3-3) MG/ML Outcome: tolerated well, no immediate complications  US-guided tarsometatarsal joint injection, right: After discussion on the risks/benefits/indications, and informed verbal consent was obtained.  A timeout was then performed.  The patient was lying supine on the examination table with the knee bent in the foot elevated with a flat foot positioning.  The area overlying the first  met in tarsometatarsal joint was prepped with ChloraPrep and multiple alcohol swabs.  Ultrasound guidance with sterile gel was used in a long axis plane to identify the joint.  Using ultrasound guidance with an out of plane, walk-down approach the 25-gauge, 1.5" needle was visualized into the tarsometatarsal joint and subsequently injected with a 1:1cc of lidocaine:betamethasone. Patient tolerated the procedure well without immediate complications.  A Band-Aid was then applied.  Procedure, treatment alternatives, risks and benefits  explained, specific risks discussed. Consent was given by the patient. Immediately prior to procedure a time out was called to verify the correct patient, procedure, equipment, support staff and site/side marked as required. Patient was prepped and draped in the usual sterile fashion.            Clinical History: No specialty comments available.  She reports that she quit smoking about 31 years ago. Her smoking use included cigarettes. She has a 6.00 pack-year smoking history. She has been exposed to tobacco smoke. She has never used smokeless tobacco. No results for input(s): "HGBA1C", "LABURIC" in the last 8760 hours.  Objective:    Physical Exam  Gen: Well-appearing, in no acute distress; non-toxic CV: Regular Rate. Well-perfused. Warm.  Resp: Breathing unlabored on room air; no wheezing. Psych: Fluid speech in conversation; appropriate affect; normal thought process Neuro: Sensation intact throughout. No gross coordination deficits.   Ortho Exam - Right foot: Prior MTP fusion incision that is well-healed.  Prior hammertoe surgery with clean incisions.  There is no strength with EHL testing, otherwise full range of motion and strength about the ankle and foot.  Over the tarsometatarsal joint of the medial right foot.  - Feet/gait: Has decent arch upon sitting, although moderate to severe loss of longitudinal arch on the right and mild loss in the left upon standing.  The right foot there is some hyperpronation through gait phase.  Mild hindfoot valgus.  Imaging:  Narrative & Impression  CLINICAL DATA:  Pain right foot   EXAM: RIGHT FOOT COMPLETE - 3+ VIEW   COMPARISON:  11/30/2020   FINDINGS: No recent fracture or dislocation is seen. There is previous osteotomy in the head of the first metatarsal. There is a surgical pin in the second metatarsal. Deformities are seen in the PIP joints of second and third toes with no significant interval change. Degenerative changes are  noted in intertarsal and tarsometatarsal joints. Small plantar spur is seen in calcaneus. Overall, no significant interval changes are noted.   IMPRESSION: No recent fracture or dislocation is seen in right foot.   Other chronic findings as described in the body of the report.     Electronically Signed   By: Elmer Picker    Past Medical/Family/Surgical/Social History: Medications & Allergies reviewed per EMR, new medications updated. Patient Active Problem List   Diagnosis Date Noted   PVC's (premature ventricular contractions) 10/11/2021   Factor V Leiden mutation ([place]) 10/11/2021   Other secondary scoliosis, lumbar region    Other spondylosis with radiculopathy, lumbar region    Spondylolisthesis, lumbar region    Thoracic spondylosis with radiculopathy    Radiculopathy, lumbar region    Breakdown (mechanical) of implanted electronic neurostimulator of spinal cord electrode (lead), sequela    Degenerative disc disease, lumbar    At risk for adverse drug event 08/31/2021   Acute on chronic blood loss anemia 08/28/2021   Drug-induced constipation 08/28/2021   GERD without esophagitis 08/28/2021   Overactive bladder 08/28/2021   Chronic migraine without aura  08/28/2021   Hx of blood clots 08/28/2021   Major neurocognitive disorder ([place]) 08/28/2021   Status post lumbar spinal fusion 08/21/2021   Weight gain 07/13/2021   Encounter for removal of internal fixation device 10/21/2020   LFTs abnormal 03/02/2020   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   Gastroesophageal reflux disease 09/22/2019   SSBE (short-segment Barrett's esophagus) 09/22/2019   Flatulence 09/22/2019   Migraine 11/11/2018   Abdominal pain, epigastric 10/14/2018   LUQ pain 10/14/2018   Attention deficit hyperactivity disorder (ADHD) 01/12/2018   Insomnia 01/12/2018   Elevated liver enzymes 09/10/2017   History of diabetes mellitus 06/06/2017   Primary  osteoarthritis of right hand 06/06/2017   Transaminasemia    TIA (transient ischemic attack) 05/01/2017   Dysphasia 05/01/2017   Chronic pain syndrome 05/01/2017   Confusion    Presence of right artificial knee joint 09/17/2016   H/O total knee replacement, right 08/15/2016   Presence of retained hardware    Memory disorder 08/14/2016   Cerebrovascular disease 08/14/2016   Unilateral primary osteoarthritis, right knee 05/29/2016   Primary osteoarthritis of both feet 05/29/2016   Trochanteric bursitis of both hips 05/25/2016   Neck pain 05/25/2016   Urinary urgency 04/19/2016   Chronic low back pain 01/11/2016   Total knee replacement status 03/23/2015   Heart palpitations 12/14/2013   Generalized osteoarthritis of multiple sites 11/04/2013   Elevated LFTs 03/17/2012   Cerebral infarction ([place]) 10/30/2011   PFO (patent foramen ovale) 10/30/2011   Acquired hypothyroidism    Thrombophlebitis    OSA on CPAP    Fibromyalgia    Status post bariatric surgery 12/07/2010   Vein disorder 11/29/2010   S/P total hysterectomy and bilateral salpingo-oophorectomy 11/29/2010   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   S/P cholecystectomy 11/29/2010   S/P ACL surgery 11/29/2010   Morbid obesity ([place]) 11/10/2010   Past Medical History:  Diagnosis Date   ADD (attention deficit disorder)    Arthritis    "all over"   Cerebral infarction ([place]) 10/30/2011   Cerebrovascular disease 08/14/2016   Chronic back pain    "all over"   Chronic low back pain 92/11/9415   Complication of anesthesia    tends to have hypotension when NPO and post-anesthesia   Constipation    takes stool softener daily   Degenerative disk disease    Degenerative joint disease    DVT (deep venous thrombosis) ([place]) 2014   RLE   Family history of adverse reaction to anesthesia    a family member woke up during surgery; "think it was my mom"   Fibromyalgia    Generalized osteoarthritis of multiple sites  11/04/2013   GERD (gastroesophageal reflux disease)    Headache    Heart palpitations 12/14/2013   resolved   History of blood clots    superficial   Hypoglycemia    Hypothyroid    takes Synthroid daily   Incomplete emptying of bladder    Insomnia    takes Trazodone nightly   Iron deficiency anemia    takes Ferrous Sulfate daily   Joint pain    Joint swelling    knees and ankles   Memory disorder 08/14/2016   Morbid obesity ([place])    Nausea    takes Zofran as needed.Seeing GI doc   Neck pain 05/25/2016   OSA on CPAP    tested more than 5 yrs ago.     Osteoarthritis    Osteopenia    in feet  PFO (patent foramen ovale)    no murmur   Pre-diabetes    Primary osteoarthritis of both feet 05/29/2016   Right bunionectomy August 2017 by Dr. Sharol Given   PVC's (premature ventricular contractions) 10/11/2021   Monitor 9/23: 1.8% PVCs; rare PACs; o/w NSR   Scoliosis    Skin abnormality 02/04/2020   raised area on lip   Stroke [facility]) "several"last 2014   right foot weakness; memory issues, black spot right visual field since" (03/23/2015)   Thrombophlebitis    Trochanteric bursitis of both hips 05/25/2016   Unilateral primary osteoarthritis, right knee 05/29/2016   Urinary urgency 04/19/2016   Vein disorder 11/29/2010   varicose veins both legs   Wears glasses    Family History  Problem Relation Age of Onset   Cancer Mother        skin and breast   Myasthenia gravis Mother    Breast cancer Mother    Heart disease Father    Cancer Father    Parkinson's disease Father    Heart disease Sister    Heart attack Sister    Heart disease Brother    Cancer Brother    Diabetes Brother    Stroke Brother    Cancer Maternal Grandfather    Hypothyroidism Daughter    Hypertension Other    Colon cancer Neg Hx    Past Surgical History:  Procedure Laterality Date   BIOPSY  11/14/2018   Procedure: BIOPSY;  Surgeon: Rogene Houston, MD;  [facility];  Service: Endoscopy;;   esophagusgastric   BONE EXCISION Right 08/29/2017   Procedure: right trapezium excision;  Surgeon: Daryll Brod, MD;  Location: [place];  Service: Orthopedics;  Laterality: Right;   BUNIONECTOMY Right 08/2015   CARDIAC CATHETERIZATION     2008.  "it was fine" (not sure why she had it done, and doesn't know where)   CARPOMETACARPEL SUSPENSION PLASTY Right 08/29/2017   Procedure: SUSPENSION PLASTY RIGHT THUMB;  Surgeon: Daryll Brod, MD;  Location: [place];  Service: Orthopedics;  Laterality: Right;   COLONOSCOPY N/A 03/25/2013   Procedure: COLONOSCOPY;  Surgeon: Rogene Houston, MD;  [facility];  Service: Endoscopy;  Laterality: N/A;  930   ERCP  06/02/2020   ESOPHAGOGASTRODUODENOSCOPY     ESOPHAGOGASTRODUODENOSCOPY (EGD) WITH PROPOFOL N/A 11/14/2018   Procedure: ESOPHAGOGASTRODUODENOSCOPY (EGD) WITH PROPOFOL;  Surgeon: Rogene Houston, MD;  [facility];  Service: Endoscopy;  Laterality: N/A;  1:55pm-office moved to 11:00am/pt notified to arrive at 9:30am per [place]     "took fallopian tubes out"   [place] Right 02/12/2020   Procedure: HALLUX INTERPHANGEAL JOINT  FUSION;  Surgeon: Felipa Furnace, DPM;  Location: [place];  Service: Podiatry;  Laterality: Right;   HAMMER TOE SURGERY Right 02/12/2020   Procedure: SECOND AND THIRD HAMMER TOE CORRECTION; CAPSULOTOMY SECOND INTERPHALANGEAL JOINT;  Surgeon: Felipa Furnace, DPM;  Location: [place];  Service: Podiatry;  Laterality: Right;   JOINT REPLACEMENT     bil knee    KNEE ARTHROSCOPY Left    KNEE ARTHROSCOPY W/ ACL RECONSTRUCTION Right yrs ago   "added pins"   LAPAROSCOPIC CHOLECYSTECTOMY  ~ 2001   ROUX-EN-Y GASTRIC BYPASS  11/20/2010   [place]   SPINAL CORD STIMULATOR INSERTION N/A 04/18/2017   Procedure: LUMBAR SPINAL CORD STIMULATOR INSERTION;  Surgeon: Clydell Hakim, MD;  Location: [place];  Service: Neurosurgery;   Laterality:  N/A;  LUMBAR SPINAL CORD STIMULATOR INSERTION   stomach stent  04/28/2020   TENDON TRANSFER Right 08/29/2017   Procedure: right abductor pollicis longus transfer;  Surgeon: Daryll Brod, MD;  Location: [place];  Service: Orthopedics;  Laterality: Right;   TOTAL KNEE ARTHROPLASTY Left 03/23/2015   Procedure: TOTAL KNEE ARTHROPLASTY;  Surgeon: Newt Minion, MD;  Location: [place];  Service: Orthopedics;  Laterality: Left;   TOTAL KNEE ARTHROPLASTY Right 08/15/2016   Procedure: RIGHT TOTAL KNEE ARTHROPLASTY, REMOVAL ACL SCREWS;  Surgeon: Newt Minion, MD;  Location: [place];  Service: Orthopedics;  Laterality: Right;   TOTAL KNEE ARTHROPLASTY WITH HARDWARE REMOVAL Right    VAGINAL HYSTERECTOMY     tah/bso   VARICOSE VEIN SURGERY Right X 2   WEIL OSTEOTOMY Right 02/12/2020   Procedure: DOUBLE L OSTEOTOMY;  Surgeon: Felipa Furnace, DPM;  Location: [place];  Service: Podiatry;  Laterality: Right;   Social History   Occupational History   Occupation: Disability   Occupation: formerly Therapist, sports, [employer]  Tobacco Use   Smoking status: Former    Packs/day: 0.75    Years: 8.00    Total pack years: 6.00    Types: Cigarettes    Quit date: 12/01/1990    Years since quitting: 31.0    Passive exposure: Past   Smokeless tobacco: Never   Tobacco comments:    quit smoking in the 1990s  Vaping Use   Vaping Use: Never used  Substance and Sexual Activity   Alcohol use: No    Comment: 03/23/2015 "stopped drinking in 2012 w/gastric bypass; drank socially before bypass"   Drug use: No   Sexual activity: Not Currently    Birth control/protection: Surgical

## 2021-12-21 DIAGNOSIS — M79671 Pain in right foot: Secondary | ICD-10-CM | POA: Diagnosis not present

## 2021-12-21 DIAGNOSIS — G894 Chronic pain syndrome: Secondary | ICD-10-CM | POA: Diagnosis not present

## 2021-12-21 DIAGNOSIS — M47816 Spondylosis without myelopathy or radiculopathy, lumbar region: Secondary | ICD-10-CM | POA: Diagnosis not present

## 2021-12-21 DIAGNOSIS — M5416 Radiculopathy, lumbar region: Secondary | ICD-10-CM | POA: Diagnosis not present

## 2021-12-22 DIAGNOSIS — E119 Type 2 diabetes mellitus without complications: Secondary | ICD-10-CM | POA: Diagnosis not present

## 2021-12-22 DIAGNOSIS — Z6841 Body Mass Index (BMI) 40.0 and over, adult: Secondary | ICD-10-CM | POA: Diagnosis not present

## 2021-12-22 DIAGNOSIS — I10 Essential (primary) hypertension: Secondary | ICD-10-CM | POA: Diagnosis not present

## 2021-12-22 DIAGNOSIS — Z0001 Encounter for general adult medical examination with abnormal findings: Secondary | ICD-10-CM | POA: Diagnosis not present

## 2021-12-22 DIAGNOSIS — E039 Hypothyroidism, unspecified: Secondary | ICD-10-CM | POA: Diagnosis not present

## 2021-12-22 DIAGNOSIS — K645 Perianal venous thrombosis: Secondary | ICD-10-CM | POA: Diagnosis not present

## 2021-12-22 DIAGNOSIS — E7849 Other hyperlipidemia: Secondary | ICD-10-CM | POA: Diagnosis not present

## 2021-12-23 ENCOUNTER — Other Ambulatory Visit: Payer: Self-pay | Admitting: Psychiatry

## 2021-12-26 ENCOUNTER — Ambulatory Visit: Payer: PPO | Attending: Rheumatology | Admitting: Rheumatology

## 2021-12-26 ENCOUNTER — Encounter: Payer: Self-pay | Admitting: Rheumatology

## 2021-12-26 VITALS — BP 106/71 | HR 75 | Resp 17 | Ht 65.0 in | Wt 256.0 lb

## 2021-12-26 DIAGNOSIS — M19071 Primary osteoarthritis, right ankle and foot: Secondary | ICD-10-CM

## 2021-12-26 DIAGNOSIS — Z8669 Personal history of other diseases of the nervous system and sense organs: Secondary | ICD-10-CM

## 2021-12-26 DIAGNOSIS — M19041 Primary osteoarthritis, right hand: Secondary | ICD-10-CM

## 2021-12-26 DIAGNOSIS — Z8672 Personal history of thrombophlebitis: Secondary | ICD-10-CM | POA: Diagnosis not present

## 2021-12-26 DIAGNOSIS — Q2112 Patent foramen ovale: Secondary | ICD-10-CM

## 2021-12-26 DIAGNOSIS — M7061 Trochanteric bursitis, right hip: Secondary | ICD-10-CM | POA: Diagnosis not present

## 2021-12-26 DIAGNOSIS — M7062 Trochanteric bursitis, left hip: Secondary | ICD-10-CM

## 2021-12-26 DIAGNOSIS — M5136 Other intervertebral disc degeneration, lumbar region: Secondary | ICD-10-CM | POA: Diagnosis not present

## 2021-12-26 DIAGNOSIS — M62838 Other muscle spasm: Secondary | ICD-10-CM | POA: Diagnosis not present

## 2021-12-26 DIAGNOSIS — M5134 Other intervertebral disc degeneration, thoracic region: Secondary | ICD-10-CM | POA: Diagnosis not present

## 2021-12-26 DIAGNOSIS — M797 Fibromyalgia: Secondary | ICD-10-CM | POA: Diagnosis not present

## 2021-12-26 DIAGNOSIS — M19072 Primary osteoarthritis, left ankle and foot: Secondary | ICD-10-CM

## 2021-12-26 DIAGNOSIS — M19042 Primary osteoarthritis, left hand: Secondary | ICD-10-CM

## 2021-12-26 DIAGNOSIS — Z8673 Personal history of transient ischemic attack (TIA), and cerebral infarction without residual deficits: Secondary | ICD-10-CM

## 2021-12-26 DIAGNOSIS — Z96653 Presence of artificial knee joint, bilateral: Secondary | ICD-10-CM | POA: Diagnosis not present

## 2021-12-26 DIAGNOSIS — Z8639 Personal history of other endocrine, nutritional and metabolic disease: Secondary | ICD-10-CM | POA: Diagnosis not present

## 2021-12-26 DIAGNOSIS — Z9884 Bariatric surgery status: Secondary | ICD-10-CM

## 2021-12-26 DIAGNOSIS — M65341 Trigger finger, right ring finger: Secondary | ICD-10-CM

## 2021-12-26 MED ORDER — TRIAMCINOLONE ACETONIDE 40 MG/ML IJ SUSP
10.0000 mg | INTRAMUSCULAR | Status: AC | PRN
  Administered 2021-12-26: 10 mg via INTRAMUSCULAR

## 2021-12-26 MED ORDER — LIDOCAINE HCL 1 % IJ SOLN
0.5000 mL | INTRAMUSCULAR | Status: AC | PRN
  Administered 2021-12-26: .5 mL

## 2021-12-26 NOTE — Patient Instructions (Signed)
Cervical Strain and Sprain Rehab Ask your health care provider which exercises are safe for you. Do exercises exactly as told by your health care provider and adjust them as directed. It is normal to feel mild stretching, pulling, tightness, or discomfort as you do these exercises. Stop right away if you feel sudden pain or your pain gets worse. Do not begin these exercises until told by your health care provider. Stretching and range-of-motion exercises Cervical side bending  Using good posture, sit on a stable chair or stand up. Without moving your shoulders, slowly tilt your left / right ear to your shoulder until you feel a stretch in the neck muscles on the opposite side. You should be looking straight ahead. Hold for __________ seconds. Repeat with the other side of your neck. Repeat __________ times. Complete this exercise __________ times a day. Cervical rotation  Using good posture, sit on a stable chair or stand up. Slowly turn your head to the side as if you are looking over your left / right shoulder. Keep your eyes level with the ground. Stop when you feel a stretch along the side and the back of your neck. Hold for __________ seconds. Repeat this by turning to your other side. Repeat __________ times. Complete this exercise __________ times a day. Thoracic extension and pectoral stretch  Roll a towel or a small blanket so it is about 4 inches (10 cm) in diameter. Lie down on your back on a firm surface. Put the towel in the middle of your back across your spine. It should not be under your shoulder blades. Put your hands behind your head and let your elbows fall out to your sides. Hold for __________ seconds. Repeat __________ times. Complete this exercise __________ times a day. Strengthening exercises Upper cervical flexion  Lie on your back with a thin pillow behind your head or a small, rolled-up towel under your neck. Gently tuck your chin toward your chest and nod  your head down to look toward your feet. Do not lift your head off the pillow. Hold for __________ seconds. Release the tension slowly. Relax your neck muscles completely before you repeat this exercise. Repeat __________ times. Complete this exercise __________ times a day. Cervical extension  Stand about 6 inches (15 cm) away from a wall, with your back facing the wall. Place a soft object, about 6-8 inches (15-20 cm) in diameter, between the back of your head and the wall. A soft object could be a small pillow, a ball, or a folded towel. Gently tilt your head back and press into the soft object. Keep your jaw and forehead relaxed. Hold for __________ seconds. Release the tension slowly. Relax your neck muscles completely before you repeat this exercise. Repeat __________ times. Complete this exercise __________ times a day. Posture and body mechanics Body mechanics refer to the movements and positions of your body while you do your daily activities. Posture is part of body mechanics. Good posture and healthy body mechanics can help to relieve stress in your body's tissues and joints. Good posture means that your spine is in its natural S-curve position (your spine is neutral), your shoulders are pulled back slightly, and your head is not tipped forward. The following are general guidelines for using improved posture and body mechanics in your everyday activities. Sitting  When sitting, keep your spine neutral and keep your feet flat on the floor. Use a footrest, if needed, and keep your thighs parallel to the floor. Avoid rounding   your shoulders. Avoid tilting your head forward. When working at a desk or a computer, keep your desk at a height where your hands are slightly lower than your elbows. Slide your chair under your desk so you are close enough to maintain good posture. When working at a computer, place your monitor at a height where you are looking straight ahead and you do not have to  tilt your head forward or downward to look at the screen. Standing  When standing, keep your spine neutral and keep your feet about hip-width apart. Keep a slight bend in your knees. Your ears, shoulders, and hips should line up. When you do a task in which you stand in one place for a long time, place one foot up on a stable object that is 2-4 inches (5-10 cm) high, such as a footstool. This helps keep your spine neutral. Resting When lying down and resting, avoid positions that are most painful for you. Try to support your neck in a neutral position. You can use a contour pillow or a small rolled-up towel. Your pillow should support your neck but not push on it. This information is not intended to replace advice given to you by your health care provider. Make sure you discuss any questions you have with your health care provider. Document Revised: 07/31/2021 Document Reviewed: 07/31/2021 Elsevier Patient Education  2023 [employer] Inc[employer]  

## 2022-01-08 ENCOUNTER — Ambulatory Visit (INDEPENDENT_AMBULATORY_CARE_PROVIDER_SITE_OTHER): Payer: PPO | Admitting: Gastroenterology

## 2022-01-08 ENCOUNTER — Encounter (INDEPENDENT_AMBULATORY_CARE_PROVIDER_SITE_OTHER): Payer: Self-pay | Admitting: Gastroenterology

## 2022-01-08 VITALS — BP 102/71 | HR 102 | Temp 97.8°F | Ht 65.0 in | Wt 251.9 lb

## 2022-01-08 DIAGNOSIS — Z9884 Bariatric surgery status: Secondary | ICD-10-CM | POA: Diagnosis not present

## 2022-01-08 DIAGNOSIS — K219 Gastro-esophageal reflux disease without esophagitis: Secondary | ICD-10-CM | POA: Diagnosis not present

## 2022-01-08 DIAGNOSIS — K5903 Drug induced constipation: Secondary | ICD-10-CM | POA: Diagnosis not present

## 2022-01-08 MED ORDER — NALOXEGOL OXALATE 25 MG PO TABS: ORAL_TABLET | ORAL | 3 refills | Status: DC

## 2022-01-08 MED ORDER — METOCLOPRAMIDE HCL 5 MG PO TABS: 5.0000 mg | ORAL_TABLET | Freq: Two times a day (BID) | ORAL | 3 refills | Status: DC

## 2022-01-08 MED ORDER — OMEPRAZOLE 40 MG PO CPDR: 40.0000 mg | DELAYED_RELEASE_CAPSULE | Freq: Two times a day (BID) | ORAL | 3 refills | Status: DC

## 2022-01-08 NOTE — Patient Instructions (Signed)
Please continue movantik daily, omeprazole twice daily and reglan twice daily, I have sent refills of all of these and requested they fill them now for you Please reach out to [facility] regarding follow up and repeat EGD that they recommended, I have provided the report for you at your request Increase water intake, aim for atleast 64 oz per day Increase fruits, veggies and whole grains, kiwi and prunes are especially good for constipation  Follow up 6 months

## 2022-01-08 NOTE — Progress Notes (Unsigned)
Referring Provider: Jake Samples, PA* Primary Care Physician:  [name] Primary GI Physician: Jenetta Downer   Chief Complaint  Patient presents with   Constipation    Follow up on constipation and GERD. Reports doing much better with both.    HPI:   Debbie Pineda is a 61 y.o. 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   Patient presenting today for follow up of constipation and GERD.  Last seen in August 2023, at that time taking oxycodone 10-347m q4-6 hours since back surgery a few weeks ago. presented to the ED on 8/11 as she had been maybe 1+ weeks without a BM. Having severe abdominal pain and back pain. taking miralax at that time and amitiza 268m BID without results. She states that she 1-2 enemas while she was in the hospital after her back surgery. She had 2 enemas during ED visit which helped. She was given lactulose to take TID PRN in the ED, she is currently taking this as well as her amitiza BID, senna x2 QHS, started on Reglan yesterday by her surgeon which seemed to help.     Given samples of linzess 29018mdaily, continue lactulose TID PRN, continue reglan, consider Movantik if linzess does not provide results.   Patient called back after her visit and wanted to start Movantik.   She called in October with some darker stools since starting celebrex at the end of September, advised to increase PPI to BID, continue H2B daily, may need to d/c celebrex if she is having melena.   Present:  States she is doing well on Movantik, having atleast 1 BM per day. Rare abdominal pain. She denies rectal bleeding or melena. Appetite is good. She has occasional GERD symptoms, maybe once per week, usually related to something she ate or if she ate later. Nausea is well controlled, usually only if she  eats something very sweet like ice cream. She sometimes will note some gas bubbles that occur when she has nausea, sometimes can lay on left side and bubbles will discipate and symptoms improve. She is doing reglan usually BID, occasionally TID.    EGD: 12/06/21  - Roux-en-y gastric bypass with dilated GJ anastamosis                         (>38m33mseveral surgical staples removed.                         - Persistent GG Fistula s/p successful partial closure                         using APC and endoscopic suturing                         - Evidence of prior Ampullotomy without residual                         adenomatous changes                         - Otherwise normal remnant stomach and duodenum  CT A/P with contrast 09/06/21 Prominent stool throughout the colon favors constipation. 2. Other imaging findings of potential clinical  significance: Small right renal angiomyolipoma. Pneumobilia. Prior cholecystectomy. Prior gastric bypass. Extensive postoperative findings in the thoracolumbar spine. ERCP: 06/02/20: No specimens collected.  - Gastric bypass. Gastrojejunal anastomosis characterized by healthy appearing mucosa. - Pre-existing gastrogastrostomy AXIOS stent. Removed and replaced with two 10Fr x 4cm double pigtail stents to maintain patency (given the anticipated need for  ampullary surveillance in the future). - A single ampullary polyp (adenomatous) Snare papillectomy of the  major papilla was performed. Resected and retrieved  with hemostasis achieved through the use of snare tip soft coagulation. - The entire main bile duct was mildly dilated. acquired. - A biliary sphincterotomy was performed.  - The biliary tree was swept and sludge was found. (Recommended repeat EGD in 6-12 months, pending path)                    Last Endoscopy:04/28/20  (EUS) The examined esophagus was endoscopically normal. Evidence of a gastric bypass was found. A gastric pouch with a small size was found. The  staple line appeared intact. The gastrojejunal anastomosis was characterized by healthy appearing mucosa. This was traversed. The pouch-to-jejunum limb was characterized by healthy  appearing mucosa. The examined jejunum was normal.   Last Colonoscopy:march 2015, normal, small eternal hemorrhoids repeat in 10 years  Past Medical History:  Diagnosis Date   ADD (attention deficit disorder)    Arthritis    "all over"   Cerebral infarction ([place]) 10/30/2011   Cerebrovascular disease 08/14/2016   Chronic back pain    "all over"   Chronic low back pain 81/44/8185   Complication of anesthesia    tends to have hypotension when NPO and post-anesthesia   Constipation    takes stool softener daily   Degenerative disk disease    Degenerative joint disease    DVT (deep venous thrombosis) ([place]) 2014   RLE   Family history of adverse reaction to anesthesia    a family member woke up during surgery; "think it was my mom"   Fibromyalgia    Generalized osteoarthritis of multiple sites 11/04/2013   GERD (gastroesophageal reflux disease)    Headache    Heart palpitations 12/14/2013   resolved   History of blood clots    superficial   Hypoglycemia    Hypothyroid    takes Synthroid daily   Incomplete emptying of bladder    Insomnia    takes Trazodone nightly   Iron deficiency anemia    takes Ferrous Sulfate daily   Joint pain    Joint swelling    knees and ankles   Memory disorder 08/14/2016   Morbid obesity ([place])    Nausea    takes Zofran as needed.Seeing GI doc   Neck pain 05/25/2016   OSA on CPAP    tested more than 5 yrs ago.     Osteoarthritis    Osteopenia    in feet   PFO (patent foramen ovale)    no murmur   Pre-diabetes    Primary osteoarthritis of both feet 05/29/2016   Right bunionectomy August 2017 by Dr. Sharol Given   PVC's (premature ventricular contractions) 10/11/2021   Monitor 9/23: 1.8% PVCs; rare PACs; o/w NSR   Scoliosis    Skin abnormality 02/04/2020   raised area  on lip   Stroke [facility]) "several"last 2014   right foot weakness; memory issues, black spot right visual field since" (03/23/2015)   Thrombophlebitis    Trochanteric bursitis of both hips 05/25/2016   Unilateral primary osteoarthritis, right knee  05/29/2016   Urinary urgency 04/19/2016   Vein disorder 11/29/2010   varicose veins both legs   Wears glasses     Past Surgical History:  Procedure Laterality Date   BIOPSY  11/14/2018   Procedure: BIOPSY;  Surgeon: Rogene Houston, MD;  [facility];  Service: Endoscopy;;  esophagusgastric   BONE EXCISION Right 08/29/2017   Procedure: right trapezium excision;  Surgeon: Daryll Brod, MD;  Location: [place];  Service: Orthopedics;  Laterality: Right;   BUNIONECTOMY Right 08/2015   CARDIAC CATHETERIZATION     2008.  "it was fine" (not sure why she had it done, and doesn't know where)   CARPOMETACARPEL SUSPENSION PLASTY Right 08/29/2017   Procedure: SUSPENSION PLASTY RIGHT THUMB;  Surgeon: Daryll Brod, MD;  Location: [place];  Service: Orthopedics;  Laterality: Right;   COLONOSCOPY N/A 03/25/2013   Procedure: COLONOSCOPY;  Surgeon: Rogene Houston, MD;  [facility];  Service: Endoscopy;  Laterality: N/A;  930   ERCP  06/02/2020   ESOPHAGOGASTRODUODENOSCOPY     ESOPHAGOGASTRODUODENOSCOPY (EGD) WITH PROPOFOL N/A 11/14/2018   Procedure: ESOPHAGOGASTRODUODENOSCOPY (EGD) WITH PROPOFOL;  Surgeon: Rogene Houston, MD;  [facility];  Service: Endoscopy;  Laterality: N/A;  1:55pm-office moved to 11:00am/pt notified to arrive at 9:30am per [place]     "took fallopian tubes out"   [place] Right 02/12/2020   Procedure: HALLUX INTERPHANGEAL JOINT  FUSION;  Surgeon: Felipa Furnace, DPM;  Location: [place];  Service: Podiatry;  Laterality: Right;   HAMMER TOE SURGERY Right 02/12/2020   Procedure: SECOND AND THIRD HAMMER TOE CORRECTION; CAPSULOTOMY SECOND  INTERPHALANGEAL JOINT;  Surgeon: Felipa Furnace, DPM;  Location: [place];  Service: Podiatry;  Laterality: Right;   JOINT REPLACEMENT     bil knee    KNEE ARTHROSCOPY Left    KNEE ARTHROSCOPY W/ ACL RECONSTRUCTION Right yrs ago   "added pins"   LAPAROSCOPIC CHOLECYSTECTOMY  ~ 2001   ROUX-EN-Y GASTRIC BYPASS  11/20/2010   [place]   SPINAL CORD STIMULATOR INSERTION N/A 04/18/2017   Procedure: LUMBAR SPINAL CORD STIMULATOR INSERTION;  Surgeon: Clydell Hakim, MD;  Location: [place];  Service: Neurosurgery;  Laterality: N/A;  LUMBAR SPINAL CORD STIMULATOR INSERTION   stomach stent  04/28/2020   TENDON TRANSFER Right 08/29/2017   Procedure: right abductor pollicis longus transfer;  Surgeon: Daryll Brod, MD;  Location: [place];  Service: Orthopedics;  Laterality: Right;   TOTAL KNEE ARTHROPLASTY Left 03/23/2015   Procedure: TOTAL KNEE ARTHROPLASTY;  Surgeon: Newt Minion, MD;  Location: [place];  Service: Orthopedics;  Laterality: Left;   TOTAL KNEE ARTHROPLASTY Right 08/15/2016   Procedure: RIGHT TOTAL KNEE ARTHROPLASTY, REMOVAL ACL SCREWS;  Surgeon: Newt Minion, MD;  Location: [place];  Service: Orthopedics;  Laterality: Right;   TOTAL KNEE ARTHROPLASTY WITH HARDWARE REMOVAL Right    VAGINAL HYSTERECTOMY     tah/bso   VARICOSE VEIN SURGERY Right X 2   WEIL OSTEOTOMY Right 02/12/2020   Procedure: DOUBLE L OSTEOTOMY;  Surgeon: Felipa Furnace, DPM;  Location: [place];  Service: Podiatry;  Laterality: Right;    Current Outpatient Medications  Medication Sig Dispense Refill   baclofen (LIORESAL) 10 MG tablet Take 1 tablet (10 mg total) by mouth 3 (three) times daily. 90 each 0   Blood Glucose Monitoring Suppl (FREESTYLE PRECISION NEO SYSTEM) w/Device KIT USE 1  STRIP TO CHECK GLUCOSE 4 TIMES DAILY AND AS NEEDED     Cholecalciferol (VITAMIN D3) 125 MCG (5000 UT) TABS Take 5,000 Units by mouth daily.     Cyanocobalamin (VITAMIN B-12) 5000 MCG SUBL  Place 5,000 mcg under the tongue daily.      diclofenac sodium (VOLTAREN) 1 % GEL Apply 4 g topically 4 (four) times daily as needed (PAIN). 5 Tube 1   donepezil (ARICEPT) 10 MG tablet Take 1 tablet (10 mg total) by mouth at bedtime. 30 tablet 0   DULoxetine (CYMBALTA) 60 MG capsule Take 1 capsule (60 mg total) by mouth 2 (two) times daily. 60 capsule 0   famotidine (PEPCID) 40 MG tablet Take 1 tablet (40 mg total) by mouth at bedtime. 30 tablet 0   ferrous gluconate (FERGON) 324 MG tablet Take 1 tablet (324 mg total) by mouth 2 (two) times daily with a meal. 60 tablet 0   folic acid (FOLVITE) 720 MCG tablet Take 400 mcg by mouth daily.     gabapentin (NEURONTIN) 800 MG tablet 800 mg. One tid     Galcanezumab-gnlm (EMGALITY) 120 MG/ML SOSY INJECT 120 MG INTO THE SKIN EVERY 30 DAYS 1 mL 4   Krill Oil 350 MG CAPS Take 350 mg by mouth at bedtime.      levothyroxine (SYNTHROID) 125 MCG tablet Take 1 tablet (125 mcg total) by mouth daily before breakfast. 30 tablet 0   Methylfol-Methylcob-Acetylcyst (METAFOLBIC PLUS) 6-2-600 MG TABS Take 1 tablet by mouth daily. 30 tablet 3   methylphenidate (CONCERTA) 27 MG PO CR tablet Take 1 tablet (27 mg total) by mouth 2 (two) times daily. 10 am & 1400 60 tablet 0   metoCLOPramide (REGLAN) 5 MG tablet Take 1 tablet (5 mg total) by mouth every 8 (eight) hours as needed for nausea. 90 tablet 2   miconazole (ZEASORB-AF) 2 % powder Every shift as needed. Apply to red areas in folds of skin.     mirabegron ER (MYRBETRIQ) 50 MG TB24 tablet Take 1 tablet (50 mg total) by mouth daily. 30 tablet 0   MOVANTIK 25 MG TABS tablet TAKE (1) TABLET BY MOUTH ONCE DAILY. 30 tablet 0   Multiple Minerals-Vitamins (CAL-MAG-ZINC-D PO) Take 3 tablets by mouth daily. 333 mg-133 unit -133 mg-5 mg     Multiple Vitamins-Minerals (ONE-A-DAY WOMENS PETITES PO) Take 1 tablet by mouth 2 (two) times daily.     NARCAN 4 MG/0.1ML LIQD nasal spray kit Place 0.1 sprays (0.4 mg total) into the nose 2  (two) times daily as needed. 1 each 0   NUCYNTA ER 100 MG 12 hr tablet SMARTSIG:1 Tablet(s) By Mouth Every 12 Hours     omeprazole (PRILOSEC) 40 MG capsule Take 1 capsule (40 mg total) by mouth 2 (two) times daily. 180 capsule 1   ondansetron (ZOFRAN-ODT) 4 MG disintegrating tablet Take 1 tablet (4 mg total) by mouth every 8 (eight) hours as needed for nausea or vomiting. 20 tablet 0   ONETOUCH ULTRA test strip      OneTouch UltraSoft 2 Lancets MISC      Oxycodone HCl 10 MG TABS Take 10 mg by mouth every 6 (six) hours as needed.     polyethylene glycol (MIRALAX / GLYCOLAX) 17 g packet Take 17 g by mouth daily as needed for mild constipation or moderate constipation.     Rimegepant Sulfate (NURTEC) 75 MG TBDP Take 75 mg by mouth daily as needed. 8 tablet 0   rivaroxaban (XARELTO) 20  MG TABS tablet Take 1 tablet (20 mg total) by mouth daily at 6 PM. 30 tablet 0   tiZANidine (ZANAFLEX) 4 MG tablet Take 1 tablet (4 mg total) by mouth every 6 (six) hours as needed for muscle spasms. 30 tablet 0   topiramate (TOPAMAX) 100 MG tablet Take 1 tablet by mouth twice daily. 180 tablet 1   traZODone (DESYREL) 100 MG tablet Take 0.5 tablets (50 mg total) by mouth at bedtime. 15 tablet 0   triamcinolone cream (KENALOG) 0.1 % SMARTSIG:1 Application Topical 2-3 Times Daily     trospium (SANCTURA) 20 MG tablet Take 1 tablet (20 mg total) by mouth 2 (two) times daily. 60 tablet 0   Ubrogepant (UBRELVY) 100 MG TABS Take 100 mg by mouth as needed. 2 tablet 0   vitamin C (ASCORBIC ACID) 500 MG tablet Take 500 mg by mouth daily.     No current facility-administered medications for this visit.    Allergies as of 01/08/2022 - Review Complete 01/08/2022  Allergen Reaction Noted   Lyrica [pregabalin] Shortness Of Breath and Swelling 12/01/2015   Belsomra [suvorexant] Other (See Comments) 12/01/2015   Morphine and related Itching 10/26/2010   Sulfamethoxazole-trimethoprim Itching and Rash 03/10/2015   Tape Itching and  Rash 03/10/2015    Family History  Problem Relation Age of Onset   Cancer Mother        skin and breast   Myasthenia gravis Mother    Breast cancer Mother    Heart disease Father    Cancer Father    Parkinson's disease Father    Heart disease Sister    Heart attack Sister    Heart disease Brother    Cancer Brother    Diabetes Brother    Stroke Brother    Cancer Maternal Grandfather    Hypothyroidism Daughter    Hypertension Other    Colon cancer Neg Hx     Social History   Socioeconomic History   Marital status: Married    Spouse name: Jeneen Rinks   Number of children: 1   Years of education: 49   Highest education level: Not on file  Occupational History   Occupation: Disability   Occupation: formerly Therapist, sports, [employer]  Tobacco Use   Smoking status: Former    Packs/day: 0.75    Years: 8.00    Total pack years: 6.00    Types: Cigarettes    Quit date: 12/01/1990    Years since quitting: 31.1    Passive exposure: Past   Smokeless tobacco: Never  Vaping Use   Vaping Use: Never used  Substance and Sexual Activity   Alcohol use: No    Comment: 03/23/2015 "stopped drinking in 2012 w/gastric bypass; drank socially before bypass"   Drug use: No   Sexual activity: Not Currently    Birth control/protection: Surgical  Other Topics Concern   Not on file  Social History Narrative   Lives with husband   Caffeine use: No soda   Mainly water, drinks decaf tea   Right handed   Social Determinants of Health   Financial Resource Strain: Not on file  Food Insecurity: Not on file  Transportation Needs: Not on file  Physical Activity: Not on file  Stress: Not on file  Social Connections: Not on file    Review of systems General: negative for malaise, night sweats, fever, chills, weight los Neck: Negative for lumps, goiter, pain and significant neck swelling Resp: Negative for cough, wheezing, dyspnea at rest CV: Negative for  chest pain, leg swelling, palpitations, orthopnea GI:  denies melena, hematochezia, nausea, vomiting, diarrhea, constipation, dysphagia, odyonophagia, early satiety or unintentional weight loss.  MSK: Negative for joint pain or swelling, back pain, and muscle pain. Derm: Negative for itching or rash Psych: Denies depression, anxiety, memory loss, confusion. No homicidal or suicidal ideation.  Heme: Negative for prolonged bleeding, bruising easily, and swollen nodes. Endocrine: Negative for cold or heat intolerance, polyuria, polydipsia and goiter. Neuro: negative for tremor, gait imbalance, syncope and seizures. The remainder of the review of systems is noncontributory.  Physical Exam: There were no vitals taken for this visit. General:   Alert and oriented. No distress noted. Pleasant and cooperative.  Head:  Normocephalic and atraumatic. Eyes:  Conjuctiva clear without scleral icterus. Mouth:  Oral mucosa pink and moist. Good dentition. No lesions. Heart: Normal rate and rhythm, s1 and s2 heart sounds present.  Lungs: Clear lung sounds in all lobes. Respirations equal and unlabored. Abdomen:  +BS, soft, non-tender and non-distended. No rebound or guarding. No HSM or masses noted. Derm: No palmar erythema or jaundice Msk:  Symmetrical without gross deformities. Normal posture. Extremities:  Without edema. Neurologic:  Alert and  oriented x4 Psych:  Alert and cooperative. Normal mood and affect.  Invalid input(s): "6 MONTHS"   ASSESSMENT: DAYNE DEKAY is a 61 y.o. female presenting today    PLAN:  Continue PPI BID, Refill  2. Continue movantik daily, refill  3. Continue reglan BID     Follow Up: ***  Nekeisha Aure L. Alver Sorrow, MSN, APRN, AGNP-C Adult-Gerontology Nurse Practitioner [facility]

## 2022-01-08 NOTE — Progress Notes (Signed)
Date:  01/08/2022   ID:  Debbie Pineda, DOB [birth year], MRN [id number]   PCP:  Jake Samples, PA-C  Cardiologist:  Jenkins Rouge, MD New to me  Electrophysiologist:  None   Evaluation Performed:  Follow-Up Visit  Chief Complaint:  PFO  History of Present Illness:    61 y.o. history of factor V Leiden with CVA on chronic xarelto. Some residual right sided weakness ? 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 Lives with husband and cares For her mother Has one daughter lives locally with two grand kids ages 95 and 33   Had some vaginal scarring surgery with some spotting when taking NSAI's so she stopped  Seen by PA/NP's in July/September 2023 complained of palpitations and irregular beats on sat monitor Worse since multi level back fusion in July ECG during office visit with PVC;s  Monitor 10/19/21 average HR 83 with < 1% PAC/PVC;s  TSH normal 3.7 10/11/21 Mg/lytes also normal Hct 39.4  Some exertional dyspnea but has not been real active since back surgery Given PVCls and age discussed need to assess for CAD with Rosey Bath   Past Medical History:  Diagnosis Date   ADD (attention deficit disorder)    Arthritis    "all over"   Cerebral infarction ([place]) 10/30/2011   Cerebrovascular disease 08/14/2016   Chronic back pain    "all over"   Chronic low back pain 25/85/2778   Complication of anesthesia    tends to have hypotension when NPO and post-anesthesia   Constipation    takes stool softener daily   Degenerative disk disease    Degenerative joint disease    DVT (deep venous thrombosis) ([place]) 2014   RLE   Family history of adverse  reaction to anesthesia    a family member woke up during surgery; "think it was my mom"   Fibromyalgia    Generalized osteoarthritis of multiple sites 11/04/2013   GERD (gastroesophageal reflux disease)    Headache    Heart palpitations 12/14/2013   resolved   History of blood clots    superficial   Hypoglycemia    Hypothyroid    takes Synthroid daily   Incomplete emptying of bladder    Insomnia    takes Trazodone nightly   Iron deficiency anemia    takes Ferrous Sulfate daily   Joint pain    Joint swelling    knees and ankles   Memory disorder 08/14/2016   Morbid obesity ([place])    Nausea    takes Zofran as needed.Seeing GI doc   Neck pain 05/25/2016   OSA on CPAP    tested more than 5 yrs ago.     Osteoarthritis    Osteopenia    in feet   PFO (patent foramen ovale)    no murmur   Pre-diabetes    Primary osteoarthritis of both feet 05/29/2016   Right bunionectomy August 2017 by Dr. Sharol Given   PVC's (premature ventricular contractions) 10/11/2021   Monitor 9/23: 1.8% PVCs; rare PACs; o/w NSR   Scoliosis    Skin abnormality 02/04/2020   raised area on lip   Stroke [facility]) "several"last  2014   right foot weakness; memory issues, black spot right visual field since" (03/23/2015)   Thrombophlebitis    Trochanteric bursitis of both hips 05/25/2016   Unilateral primary osteoarthritis, right knee 05/29/2016   Urinary urgency 04/19/2016   Vein disorder 11/29/2010   varicose veins both legs   Wears glasses    Past Surgical History:  Procedure Laterality Date   BIOPSY  11/14/2018   Procedure: BIOPSY;  Surgeon: Rogene Houston, MD;  [facility];  Service: Endoscopy;;  esophagusgastric   BONE EXCISION Right 08/29/2017   Procedure: right trapezium excision;  Surgeon: Daryll Brod, MD;  Location: [place];  Service: Orthopedics;  Laterality: Right;   BUNIONECTOMY Right 08/2015   CARDIAC CATHETERIZATION     2008.  "it was fine" (not sure why she had it  done, and doesn't know where)   CARPOMETACARPEL SUSPENSION PLASTY Right 08/29/2017   Procedure: SUSPENSION PLASTY RIGHT THUMB;  Surgeon: Daryll Brod, MD;  Location: [place];  Service: Orthopedics;  Laterality: Right;   COLONOSCOPY N/A 03/25/2013   Procedure: COLONOSCOPY;  Surgeon: Rogene Houston, MD;  [facility];  Service: Endoscopy;  Laterality: N/A;  930   ERCP  06/02/2020   ESOPHAGOGASTRODUODENOSCOPY     ESOPHAGOGASTRODUODENOSCOPY (EGD) WITH PROPOFOL N/A 11/14/2018   Procedure: ESOPHAGOGASTRODUODENOSCOPY (EGD) WITH PROPOFOL;  Surgeon: Rogene Houston, MD;  [facility];  Service: Endoscopy;  Laterality: N/A;  1:55pm-office moved to 11:00am/pt notified to arrive at 9:30am per [place]     "took fallopian tubes out"   [place] Right 02/12/2020   Procedure: HALLUX INTERPHANGEAL JOINT  FUSION;  Surgeon: Felipa Furnace, DPM;  Location: [place];  Service: Podiatry;  Laterality: Right;   HAMMER TOE SURGERY Right 02/12/2020   Procedure: SECOND AND THIRD HAMMER TOE CORRECTION; CAPSULOTOMY SECOND INTERPHALANGEAL JOINT;  Surgeon: Felipa Furnace, DPM;  Location: [place];  Service: Podiatry;  Laterality: Right;   JOINT REPLACEMENT     bil knee    KNEE ARTHROSCOPY Left    KNEE ARTHROSCOPY W/ ACL RECONSTRUCTION Right yrs ago   "added pins"   LAPAROSCOPIC CHOLECYSTECTOMY  ~ 2001   ROUX-EN-Y GASTRIC BYPASS  11/20/2010   [place]   SPINAL CORD STIMULATOR INSERTION N/A 04/18/2017   Procedure: LUMBAR SPINAL CORD STIMULATOR INSERTION;  Surgeon: Clydell Hakim, MD;  Location: [place];  Service: Neurosurgery;  Laterality: N/A;  LUMBAR SPINAL CORD STIMULATOR INSERTION   stomach stent  04/28/2020   TENDON TRANSFER Right 08/29/2017   Procedure: right abductor pollicis longus transfer;  Surgeon: Daryll Brod, MD;  Location: [place];  Service: Orthopedics;  Laterality: Right;   TOTAL KNEE ARTHROPLASTY  Left 03/23/2015   Procedure: TOTAL KNEE ARTHROPLASTY;  Surgeon: Newt Minion, MD;  Location: [place];  Service: Orthopedics;  Laterality: Left;   TOTAL KNEE ARTHROPLASTY Right 08/15/2016   Procedure: RIGHT TOTAL KNEE ARTHROPLASTY, REMOVAL ACL SCREWS;  Surgeon: Newt Minion, MD;  Location: [place];  Service: Orthopedics;  Laterality: Right;   TOTAL KNEE ARTHROPLASTY WITH HARDWARE REMOVAL Right    VAGINAL HYSTERECTOMY     tah/bso   VARICOSE VEIN SURGERY Right X 2   WEIL OSTEOTOMY Right 02/12/2020   Procedure: DOUBLE L OSTEOTOMY;  Surgeon: Felipa Furnace, DPM;  Location: [place];  Service: Podiatry;  Laterality: Right;     No outpatient medications have been marked as taking for  the 01/17/22 encounter (Appointment) with Josue Hector, MD.     Allergies:   Lyrica [pregabalin], Belsomra [suvorexant], Morphine and related, Sulfamethoxazole-trimethoprim, and Tape   Social History   Tobacco Use   Smoking status: Former    Packs/day: 0.75    Years: 8.00    Total pack years: 6.00    Types: Cigarettes    Quit date: 12/01/1990    Years since quitting: 31.1    Passive exposure: Past   Smokeless tobacco: Never  Vaping Use   Vaping Use: Never used  Substance Use Topics   Alcohol use: No    Comment: 03/23/2015 "stopped drinking in 2012 w/gastric bypass; drank socially before bypass"   Drug use: No     Family Hx: The patient's family history includes Breast cancer in her mother; Cancer in her brother, father, maternal grandfather, and mother; Diabetes in her brother; Heart attack in her sister; Heart disease in her brother, father, and sister; Hypertension in an other family member; Hypothyroidism in her daughter; Myasthenia gravis in her mother; Parkinson's disease in her father; Stroke in her brother. There is no history of Colon cancer.  ROS:   Please see the history of present illness.     All other systems reviewed and are negative.   Prior CV studies:   The following  studies were reviewed today:  TTE 01/18/20  Notes from Dr Raliegh Ip Monitor 9.28.23   Labs/Other Tests and Data Reviewed:    EKG:   10/11/21 SR normal QT nonspecific ST changes PVC;s   Recent Labs: 09/06/2021: ALT 13; Hemoglobin 12.9; Platelets 365 10/11/2021: BUN 11; Creatinine, Ser 0.93; Magnesium 2.2; Potassium 4.6; Sodium 143; TSH 3.700   Recent Lipid Panel Lab Results  Component Value Date/Time   CHOL 151 05/02/2017 04:19 AM   TRIG 54 05/02/2017 04:19 AM   HDL 74 05/02/2017 04:19 AM   CHOLHDL 2.0 05/02/2017 04:19 AM   LDLCALC 66 05/02/2017 04:19 AM    Wt Readings from Last 3 Encounters:  01/08/22 251 lb 14.4 oz (114.3 kg)  12/26/21 256 lb (116.1 kg)  12/19/21 251 lb (113.9 kg)     Objective:    Vital Signs:  There were no vitals taken for this visit.   Affect appropriate Healthy:  appears stated age 71: normal Neck supple with no adenopathy JVP normal no bruits no thyromegaly Lungs clear with no wheezing and good diaphragmatic motion Heart:  S1/S2 no murmur, no rub, gallop or click PMI normal Abdomen: benighn, BS positve, no tenderness, no AAA no bruit.  No HSM or HJR Distal pulses intact with no bruits No edema Neuro residual right sided weakness from CVA  Skin warm and dry No muscular weakness Post right foot surgery x 2    ASSESSMENT & PLAN:    1.  PFO documented TTE 01/18/20  Has been 9 years since placed on anticoagulation with no recurrence no indication for closure referral    2.  Bilateral venous varicosities: documented reflux has some fibromyalgia and does not like wearing compression stockings stable   3. Thyroid:  On replacement labs with primary   4. Ortho:  Post right bunion  and hammer toe surgery chronic pain   5. Palpitations: benign monitor 10/19/21 normal EF on multiple prior echos she had done for PFO most recent 01/18/20 ECG 10/11/21 normal QT Discussed need to r/o CAD Order Lexiscan myovue    Medication Adjustments/Labs and Tests  Ordered: Current medicines are reviewed at length with the patient today.  Concerns regarding medicines are outlined above.   Tests Ordered: See labs above   Lexiscan myovue   Medication Changes: No orders of the defined types were placed in this encounter.  None   Disposition:  Follow up in 1 year(s) if stress test low risk   Signed, Jenkins Rouge, MD  01/08/2022 5:14 PM    [facility]

## 2022-01-10 ENCOUNTER — Encounter: Payer: Self-pay | Admitting: Sports Medicine

## 2022-01-10 ENCOUNTER — Ambulatory Visit: Payer: PPO | Admitting: Sports Medicine

## 2022-01-10 DIAGNOSIS — M533 Sacrococcygeal disorders, not elsewhere classified: Secondary | ICD-10-CM

## 2022-01-10 DIAGNOSIS — M19071 Primary osteoarthritis, right ankle and foot: Secondary | ICD-10-CM

## 2022-01-10 DIAGNOSIS — Z981 Arthrodesis status: Secondary | ICD-10-CM | POA: Diagnosis not present

## 2022-01-10 NOTE — Progress Notes (Signed)
Debbie Pineda - 61 y.o. female MRN [id number]  Date of birth: [birth year]  Office Visit Note: Visit Date: 01/10/2022 PCP: Jake Samples, PA-C Referred by: Jake Samples, PA*  Subjective: Chief Complaint  Patient presents with   Right Foot - Pain   HPI: Debbie Pineda is a pleasant 61 y.o. female who presents today for follow-up of right SI-joint pain and right midfoot pain.  Last visit on 12/20/21 we did perform US-guided tarsometatarsal (1st) joint injection.  This did give her excellent improvement.  She also continues in the orthotics in the shoes, she does find these comfortable.  When she is around the house, she does not wear tennis shoes however.  SI-joint pain/low back-her previous injection months ago did help.  However over the last few weeks she does feel like the low back is somewhat weak and occasionally has some pain more so of the upper lumbar spine and mid back.  No radicular symptoms.  Pertinent ROS were reviewed with the patient and found to be negative unless otherwise specified above in HPI.   Assessment & Plan: Visit Diagnoses:  1. S/P lumbar fusion   2. Sacroiliac joint pain   3. Arthritis of right midfoot    Plan: I was pleased to hear that she is not having good relief of her midfoot arthritic pain after the injection.  She also will continue and the orthotics to help support the foot and her longitudinal and transverse arch.  Did discuss with her wearing supportive housewear (recommend Oofos recovery slides and others she may purchase OTC).  In terms of her back, she is describing some pain and associated weakness.  She does have a significant lumbar fusion (T10-S1 PSIF and multi-level TLIF) but today I think her pain is more so emanating from the paraspinal muscles and associated weakness.  I discussed the role for formalized physical therapy versus home exercises.  She would like to proceed with home therapy. I did discuss McKenzie exercises and  other low back exercises for her to work on strengthening and gentle range of motion.  My athletic trainer did create and provide a HEP handout and reviewed these with her as well today.  She will follow-up with me as needed.  If her low back is still giving her any issues, recommend she follow-up with Dr. Laurance Flatten.  Follow-up: Return if symptoms worsen or fail to improve.   Meds & Orders: No orders of the defined types were placed in this encounter.  No orders of the defined types were placed in this encounter.    Procedures: No procedures performed      Clinical History: No specialty comments available.  She reports that she quit smoking about 31 years ago. Her smoking use included cigarettes. She has a 6.00 pack-year smoking history. She has been exposed to tobacco smoke. She has never used smokeless tobacco. No results for input(s): "HGBA1C", "LABURIC" in the last 8760 hours.  Objective:   Vital Signs: There were no vitals taken for this visit.  Physical Exam  Gen: Well-appearing, in no acute distress; non-toxic CV: Regular Rate. Well-perfused. Warm.  Resp: Breathing unlabored on room air; no wheezing. Psych: Fluid speech in conversation; appropriate affect; normal thought process Neuro: Sensation intact throughout. No gross coordination deficits.   Ortho Exam - Right foot: Prior MTP fusion incision that is well-healed.  Full range of motion about the ankle and foot, other than stiffness and weakness with EHL testing.  No tenderness to palpation  over the tarsometatarsal joints today.  No redness or swelling.  Orthotic arch supports noted in shoes.  - Low back: There is some hypertonicity of the bilateral paraspinal muscles in the upper lumbar and lower thoracic region.  No midline spinous process TTP.  There is slight limited flexion and extension, but appropriate given her prior spinal fusion.  5/5 strength in bilateral lower extremities.  No TTP palpable over the SI joints  today.  Imaging: No results found.  Past Medical/Family/Surgical/Social History: Medications & Allergies reviewed per EMR, new medications updated. Patient Active Problem List   Diagnosis Date Noted   PVC's (premature ventricular contractions) 10/11/2021   Factor V Leiden mutation ([place]) 10/11/2021   Other secondary scoliosis, lumbar region    Other spondylosis with radiculopathy, lumbar region    Spondylolisthesis, lumbar region    Thoracic spondylosis with radiculopathy    Radiculopathy, lumbar region    Breakdown (mechanical) of implanted electronic neurostimulator of spinal cord electrode (lead), sequela    Degenerative disc disease, lumbar    At risk for adverse drug event 08/31/2021   Acute on chronic blood loss anemia 08/28/2021   Drug-induced constipation 08/28/2021   GERD without esophagitis 08/28/2021   Overactive bladder 08/28/2021   Chronic migraine without aura 08/28/2021   Hx of blood clots 08/28/2021   Major neurocognitive disorder ([place]) 08/28/2021   Status post lumbar spinal fusion 08/21/2021   Weight gain 07/13/2021   Encounter for removal of internal fixation device 10/21/2020   LFTs abnormal 03/02/2020   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   Gastroesophageal reflux disease 09/22/2019   SSBE (short-segment Barrett's esophagus) 09/22/2019   Flatulence 09/22/2019   Migraine 11/11/2018   Abdominal pain, epigastric 10/14/2018   LUQ pain 10/14/2018   Attention deficit hyperactivity disorder (ADHD) 01/12/2018   Insomnia 01/12/2018   Elevated liver enzymes 09/10/2017   History of diabetes mellitus 06/06/2017   Primary osteoarthritis of right hand 06/06/2017   Transaminasemia    TIA (transient ischemic attack) 05/01/2017   Dysphasia 05/01/2017   Chronic pain syndrome 05/01/2017   Confusion    Presence of right artificial knee joint 09/17/2016   H/O total knee replacement, right 08/15/2016   Presence of retained hardware     Memory disorder 08/14/2016   Cerebrovascular disease 08/14/2016   Unilateral primary osteoarthritis, right knee 05/29/2016   Primary osteoarthritis of both feet 05/29/2016   Trochanteric bursitis of both hips 05/25/2016   Neck pain 05/25/2016   Urinary urgency 04/19/2016   Chronic low back pain 01/11/2016   Total knee replacement status 03/23/2015   Heart palpitations 12/14/2013   Generalized osteoarthritis of multiple sites 11/04/2013   Elevated LFTs 03/17/2012   Cerebral infarction ([place]) 10/30/2011   PFO (patent foramen ovale) 10/30/2011   Acquired hypothyroidism    Thrombophlebitis    OSA on CPAP    Fibromyalgia    Status post bariatric surgery 12/07/2010   Vein disorder 11/29/2010   S/P total hysterectomy and bilateral salpingo-oophorectomy 11/29/2010   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   S/P cholecystectomy 11/29/2010   S/P ACL surgery 11/29/2010   Morbid obesity ([place]) 11/10/2010   Past Medical History:  Diagnosis Date   ADD (attention deficit disorder)    Arthritis    "all over"   Cerebral infarction ([place]) 10/30/2011   Cerebrovascular disease 08/14/2016   Chronic back pain    "all over"   Chronic low back pain 10/93/2355   Complication of anesthesia    tends to have hypotension when  NPO and post-anesthesia   Constipation    takes stool softener daily   Degenerative disk disease    Degenerative joint disease    DVT (deep venous thrombosis) ([place]) 2014   RLE   Family history of adverse reaction to anesthesia    a family member woke up during surgery; "think it was my mom"   Fibromyalgia    Generalized osteoarthritis of multiple sites 11/04/2013   GERD (gastroesophageal reflux disease)    Headache    Heart palpitations 12/14/2013   resolved   History of blood clots    superficial   Hypoglycemia    Hypothyroid    takes Synthroid daily   Incomplete emptying of bladder    Insomnia    takes Trazodone nightly   Iron deficiency anemia    takes  Ferrous Sulfate daily   Joint pain    Joint swelling    knees and ankles   Memory disorder 08/14/2016   Morbid obesity ([place])    Nausea    takes Zofran as needed.Seeing GI doc   Neck pain 05/25/2016   OSA on CPAP    tested more than 5 yrs ago.     Osteoarthritis    Osteopenia    in feet   PFO (patent foramen ovale)    no murmur   Pre-diabetes    Primary osteoarthritis of both feet 05/29/2016   Right bunionectomy August 2017 by Dr. Sharol Given   PVC's (premature ventricular contractions) 10/11/2021   Monitor 9/23: 1.8% PVCs; rare PACs; o/w NSR   Scoliosis    Skin abnormality 02/04/2020   raised area on lip   Stroke [facility]) "several"last 2014   right foot weakness; memory issues, black spot right visual field since" (03/23/2015)   Thrombophlebitis    Trochanteric bursitis of both hips 05/25/2016   Unilateral primary osteoarthritis, right knee 05/29/2016   Urinary urgency 04/19/2016   Vein disorder 11/29/2010   varicose veins both legs   Wears glasses    Family History  Problem Relation Age of Onset   Cancer Mother        skin and breast   Myasthenia gravis Mother    Breast cancer Mother    Heart disease Father    Cancer Father    Parkinson's disease Father    Heart disease Sister    Heart attack Sister    Heart disease Brother    Cancer Brother    Diabetes Brother    Stroke Brother    Cancer Maternal Grandfather    Hypothyroidism Daughter    Hypertension Other    Colon cancer Neg Hx    Past Surgical History:  Procedure Laterality Date   BIOPSY  11/14/2018   Procedure: BIOPSY;  Surgeon: Rogene Houston, MD;  [facility];  Service: Endoscopy;;  esophagusgastric   BONE EXCISION Right 08/29/2017   Procedure: right trapezium excision;  Surgeon: Daryll Brod, MD;  Location: [place];  Service: Orthopedics;  Laterality: Right;   BUNIONECTOMY Right 08/2015   CARDIAC CATHETERIZATION     2008.  "it was fine" (not sure why she had it done, and doesn't  know where)   CARPOMETACARPEL SUSPENSION PLASTY Right 08/29/2017   Procedure: SUSPENSION PLASTY RIGHT THUMB;  Surgeon: Daryll Brod, MD;  Location: [place];  Service: Orthopedics;  Laterality: Right;   COLONOSCOPY N/A 03/25/2013   Procedure: COLONOSCOPY;  Surgeon: Rogene Houston, MD;  [facility];  Service: Endoscopy;  Laterality: N/A;  930  ERCP  06/02/2020   ESOPHAGOGASTRODUODENOSCOPY     ESOPHAGOGASTRODUODENOSCOPY (EGD) WITH PROPOFOL N/A 11/14/2018   Procedure: ESOPHAGOGASTRODUODENOSCOPY (EGD) WITH PROPOFOL;  Surgeon: Rogene Houston, MD;  [facility];  Service: Endoscopy;  Laterality: N/A;  1:55pm-office moved to 11:00am/pt notified to arrive at 9:30am per [place]     "took fallopian tubes out"   [place] Right 02/12/2020   Procedure: HALLUX INTERPHANGEAL JOINT  FUSION;  Surgeon: Felipa Furnace, DPM;  Location: [place];  Service: Podiatry;  Laterality: Right;   HAMMER TOE SURGERY Right 02/12/2020   Procedure: SECOND AND THIRD HAMMER TOE CORRECTION; CAPSULOTOMY SECOND INTERPHALANGEAL JOINT;  Surgeon: Felipa Furnace, DPM;  Location: [place];  Service: Podiatry;  Laterality: Right;   JOINT REPLACEMENT     bil knee    KNEE ARTHROSCOPY Left    KNEE ARTHROSCOPY W/ ACL RECONSTRUCTION Right yrs ago   "added pins"   LAPAROSCOPIC CHOLECYSTECTOMY  ~ 2001   ROUX-EN-Y GASTRIC BYPASS  11/20/2010   [place]   SPINAL CORD STIMULATOR INSERTION N/A 04/18/2017   Procedure: LUMBAR SPINAL CORD STIMULATOR INSERTION;  Surgeon: Clydell Hakim, MD;  Location: [place];  Service: Neurosurgery;  Laterality: N/A;  LUMBAR SPINAL CORD STIMULATOR INSERTION   stomach stent  04/28/2020   TENDON TRANSFER Right 08/29/2017   Procedure: right abductor pollicis longus transfer;  Surgeon: Daryll Brod, MD;  Location: [place];  Service: Orthopedics;  Laterality: Right;   TOTAL KNEE ARTHROPLASTY Left 03/23/2015    Procedure: TOTAL KNEE ARTHROPLASTY;  Surgeon: Newt Minion, MD;  Location: [place];  Service: Orthopedics;  Laterality: Left;   TOTAL KNEE ARTHROPLASTY Right 08/15/2016   Procedure: RIGHT TOTAL KNEE ARTHROPLASTY, REMOVAL ACL SCREWS;  Surgeon: Newt Minion, MD;  Location: [place];  Service: Orthopedics;  Laterality: Right;   TOTAL KNEE ARTHROPLASTY WITH HARDWARE REMOVAL Right    VAGINAL HYSTERECTOMY     tah/bso   VARICOSE VEIN SURGERY Right X 2   WEIL OSTEOTOMY Right 02/12/2020   Procedure: DOUBLE L OSTEOTOMY;  Surgeon: Felipa Furnace, DPM;  Location: [place];  Service: Podiatry;  Laterality: Right;   Social History   Occupational History   Occupation: Disability   Occupation: formerly Therapist, sports, [employer]  Tobacco Use   Smoking status: Former    Packs/day: 0.75    Years: 8.00    Total pack years: 6.00    Types: Cigarettes    Quit date: 12/01/1990    Years since quitting: 31.1    Passive exposure: Past   Smokeless tobacco: Never  Vaping Use   Vaping Use: Never used  Substance and Sexual Activity   Alcohol use: No    Comment: 03/23/2015 "stopped drinking in 2012 w/gastric bypass; drank socially before bypass"   Drug use: No   Sexual activity: Not Currently    Birth control/protection: Surgical

## 2022-01-10 NOTE — Progress Notes (Signed)
Follow up from foot injection; doing better Inserts helping some   Patient was instructed in 10 minutes of therapeutic exercises for low back pain to improve strength, ROM and function according to my instructions and plan of care by a Certified Athletic Trainer during the office visit. Proper technique shown and discussed, handout provided. Patient did perform exercises and demonstrate understanding through teachback.  All questions discussed and answered.

## 2022-01-10 NOTE — Patient Instructions (Signed)
Takya - you can try:  Oofos recovery slides --> they also have the clogs that are closed toe

## 2022-01-11 ENCOUNTER — Telehealth: Payer: Self-pay | Admitting: Neurology

## 2022-01-11 DIAGNOSIS — H5310 Unspecified subjective visual disturbances: Secondary | ICD-10-CM | POA: Diagnosis not present

## 2022-01-11 NOTE — Telephone Encounter (Signed)
PA completed on [employer] KEY [id number] Will await determination

## 2022-01-11 NOTE — Telephone Encounter (Signed)
Drug approved [id number]

## 2022-01-17 ENCOUNTER — Encounter: Payer: Self-pay | Admitting: Cardiovascular Disease

## 2022-01-17 ENCOUNTER — Ambulatory Visit: Payer: PPO | Attending: Cardiovascular Disease | Admitting: Cardiovascular Disease

## 2022-01-17 VITALS — BP 104/70 | HR 76 | Ht 65.0 in | Wt 264.6 lb

## 2022-01-17 DIAGNOSIS — Q2112 Patent foramen ovale: Secondary | ICD-10-CM

## 2022-01-17 DIAGNOSIS — I493 Ventricular premature depolarization: Secondary | ICD-10-CM

## 2022-01-17 DIAGNOSIS — D6851 Activated protein C resistance: Secondary | ICD-10-CM

## 2022-01-17 DIAGNOSIS — R0602 Shortness of breath: Secondary | ICD-10-CM | POA: Diagnosis not present

## 2022-01-17 NOTE — Patient Instructions (Signed)
Medication Instructions:  Your physician recommends that you continue on your current medications as directed. Please refer to the Current Medication list given to you today.   Labwork: None  Testing/Procedures: Your physician has requested that you have a lexiscan myoview. For further information please visit [email or web address]. Please follow instruction sheet, as given.   Follow-Up: Follow up with Dr. Johnsie Cancel in 1 year.   Any Other Special Instructions Will Be Listed Below (If Applicable).     If you need a refill on your cardiac medications before your next appointment, please call your pharmacy.

## 2022-01-23 DIAGNOSIS — M5416 Radiculopathy, lumbar region: Secondary | ICD-10-CM | POA: Diagnosis not present

## 2022-01-23 DIAGNOSIS — M47816 Spondylosis without myelopathy or radiculopathy, lumbar region: Secondary | ICD-10-CM | POA: Diagnosis not present

## 2022-01-23 DIAGNOSIS — G894 Chronic pain syndrome: Secondary | ICD-10-CM | POA: Diagnosis not present

## 2022-01-23 DIAGNOSIS — M79671 Pain in right foot: Secondary | ICD-10-CM | POA: Diagnosis not present

## 2022-02-02 ENCOUNTER — Encounter (HOSPITAL_COMMUNITY)
Admission: RE | Admit: 2022-02-02 | Discharge: 2022-02-02 | Disposition: A | Discharge status: Home / Self Care | Payer: PPO | Source: Ambulatory Visit | Attending: Cardiovascular Disease | Admitting: Cardiovascular Disease

## 2022-02-02 ENCOUNTER — Ambulatory Visit (HOSPITAL_COMMUNITY)
Admission: RE | Admit: 2022-02-02 | Discharge: 2022-02-02 | Disposition: A | Discharge status: Home / Self Care | Payer: PPO | Source: Ambulatory Visit | Attending: Cardiovascular Disease | Admitting: Cardiovascular Disease

## 2022-02-02 DIAGNOSIS — R0602 Shortness of breath: Secondary | ICD-10-CM | POA: Insufficient documentation

## 2022-02-02 LAB — NM MYOCAR MULTI W/SPECT W/WALL MOTION / EF
LV dias vol: 82 mL (ref 46–106)
LV sys vol: 23 mL
Nuc Stress EF: 73 %
Peak HR: 85 {beats}/min
RATE: 0.3
Rest HR: 64 {beats}/min
Rest Nuclear Isotope Dose: 11 mCi
SDS: 0
SRS: 0
SSS: 0
ST Depression (mm): 0 mm
Stress Nuclear Isotope Dose: 30 mCi
TID: 1.09

## 2022-02-02 MED ORDER — REGADENOSON 0.4 MG/5ML IV SOLN
INTRAVENOUS | Status: AC
  Administered 2022-02-02: 0.4 mg via INTRAVENOUS
  Filled 2022-02-02: qty 5

## 2022-02-02 MED ORDER — TECHNETIUM TC 99M TETROFOSMIN IV KIT
30.0000 | PACK | Freq: Once | INTRAVENOUS | Status: AC | PRN
  Administered 2022-02-02: 30 via INTRAVENOUS

## 2022-02-02 MED ORDER — SODIUM CHLORIDE FLUSH 0.9 % IV SOLN
INTRAVENOUS | Status: AC
  Filled 2022-02-02: qty 10

## 2022-02-02 MED ORDER — TECHNETIUM TC 99M TETROFOSMIN IV KIT
10.0000 | PACK | Freq: Once | INTRAVENOUS | Status: AC | PRN
  Administered 2022-02-02: 11 via INTRAVENOUS

## 2022-02-11 ENCOUNTER — Other Ambulatory Visit: Payer: Self-pay | Admitting: Psychiatry

## 2022-02-11 DIAGNOSIS — G43909 Migraine, unspecified, not intractable, without status migrainosus: Secondary | ICD-10-CM

## 2022-02-12 DIAGNOSIS — F902 Attention-deficit hyperactivity disorder, combined type: Secondary | ICD-10-CM | POA: Diagnosis not present

## 2022-02-12 DIAGNOSIS — B370 Candidal stomatitis: Secondary | ICD-10-CM | POA: Diagnosis not present

## 2022-02-12 DIAGNOSIS — Z6841 Body Mass Index (BMI) 40.0 and over, adult: Secondary | ICD-10-CM | POA: Diagnosis not present

## 2022-02-14 NOTE — Telephone Encounter (Signed)
Last seen 11/2021 6 month follow up scheduled on 07/10/22 with Judson Roch

## 2022-02-15 DIAGNOSIS — H2513 Age-related nuclear cataract, bilateral: Secondary | ICD-10-CM | POA: Diagnosis not present

## 2022-02-22 ENCOUNTER — Other Ambulatory Visit: Payer: Self-pay | Admitting: Neurology

## 2022-02-22 DIAGNOSIS — R413 Other amnesia: Secondary | ICD-10-CM

## 2022-02-23 ENCOUNTER — Ambulatory Visit
Admission: RE | Admit: 2022-02-23 | Discharge: 2022-02-23 | Disposition: A | Discharge status: Home / Self Care | Payer: PPO | Source: Ambulatory Visit | Attending: Physical Medicine and Rehabilitation | Admitting: Physical Medicine and Rehabilitation

## 2022-02-23 ENCOUNTER — Other Ambulatory Visit: Payer: Self-pay | Admitting: Physical Medicine and Rehabilitation

## 2022-02-23 DIAGNOSIS — M47817 Spondylosis without myelopathy or radiculopathy, lumbosacral region: Secondary | ICD-10-CM

## 2022-02-23 DIAGNOSIS — M545 Low back pain, unspecified: Secondary | ICD-10-CM

## 2022-02-23 DIAGNOSIS — G894 Chronic pain syndrome: Secondary | ICD-10-CM | POA: Diagnosis not present

## 2022-02-23 DIAGNOSIS — M47816 Spondylosis without myelopathy or radiculopathy, lumbar region: Secondary | ICD-10-CM | POA: Diagnosis not present

## 2022-02-23 DIAGNOSIS — M79671 Pain in right foot: Secondary | ICD-10-CM | POA: Diagnosis not present

## 2022-02-23 DIAGNOSIS — M5416 Radiculopathy, lumbar region: Secondary | ICD-10-CM | POA: Diagnosis not present

## 2022-03-07 ENCOUNTER — Ambulatory Visit: Payer: PPO | Admitting: Orthopedic Surgery

## 2022-03-07 ENCOUNTER — Ambulatory Visit (INDEPENDENT_AMBULATORY_CARE_PROVIDER_SITE_OTHER): Payer: PPO

## 2022-03-07 ENCOUNTER — Encounter: Payer: Self-pay | Admitting: Orthopedic Surgery

## 2022-03-07 VITALS — BP 108/74 | HR 76

## 2022-03-07 DIAGNOSIS — M533 Sacrococcygeal disorders, not elsewhere classified: Secondary | ICD-10-CM

## 2022-03-07 DIAGNOSIS — Z981 Arthrodesis status: Secondary | ICD-10-CM | POA: Diagnosis not present

## 2022-03-07 NOTE — Progress Notes (Signed)
Orthopedic Spine Surgery Office Note  Assessment: Patient is a 62 y.o. female who underwent T10-S1 PSIF and multi-level TLIF with my partner on 07/2021 who is doing well after surgery, but has noticed increased SI joint pain within the last month   Plan: -Explained that initially conservative treatment is tried as a significant number of patients may experience relief with these treatment modalities. Discussed that the conservative treatments include:  -activity modification  -physical therapy  -over the counter pain medications  -medrol dosepak  -steroid injections -Patient has tried activity modification and pain management -Recommended trial of SI joint injections with Dr. Rolena Infante and weight loss -Patient should return to office in 12 weeks, x-rays at next visit: AP/lateral/flex/ex lumbar   Patient expressed understanding of the plan and all questions were answered to the patient's satisfaction.   ___________________________________________________________________________  History: Patient is a 62 y.o. female who has been previously seen in the office for routine post-operative follow up after my partner did a T10-S1 PSIF with multi-level TLIFs. She noted within the last month onset of bilateral buttock pain in the area of the SI joint. It is worse on the right side. Her pain radiates around to the lateral aspect of the hip on the right side. It does not radiate anywhere else on the left side. There was no trauma or injury that brought on the pain. Denies paresthesias and numbness.   Previous treatments: pain management, activity modification  COPY OF FIRST NOTE Patient is a 62 y.o. female who presents today for follow up after T10-S1 PSIF and multi-level TLIF with my partner in July 2023.  Patient was having some right-sided SI joint pain at her last visit with Dr. Louanne Skye.  She did get a recent injection and reports that her symptoms have improved.  She feels some right-sided leg weakness  at times.  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  She uses a cane to help with this.  She has not noticed any new weakness since the surgery.  She sometimes feels off balance as a result of this weakness.  Has chronic incontinence with no recent changes.     Weakness: Yes, left leg.  No other weakness Symptoms of imbalance: Yes, because of the left leg weakness Paresthesias and numbness: Denies Bowel or bladder incontinence: Yes, chronic no new changes Saddle anesthesia: Denies END OF COPY  Physical Exam:  General: no acute distress, appears stated age Neurologic: alert, answering questions appropriately, following commands Respiratory: unlabored breathing on room air, symmetric chest rise Psychiatric: appropriate affect, normal cadence to speech  Incision appears well healed with no evidence of infection  MSK (spine):  -Strength exam      Left  Right EHL    5/5  -/5 TA    5/5  5/5 GSC    5/5  5/5 Knee extension  5/5  5/5 Hip flexion   5/5  5/5  Had MTP fusion on the right   -Sensory exam    Sensation intact to light touch in L3-S1 nerve distributions of bilateral lower extremities  -Straight leg raise: negative -Contralateral straight leg raise: negative -Clonus: no beats bilaterally  -Right hip exam: positive FADIR, pain with SI joint compression test, TTP over the SI joint, negative FABER -Left hip exam: positive FADIR, no pain with SI joint compression test, TTP over he SI joint, negative FABER  Imaging: XR of the lumbar spine from 03/07/2022 was independently reviewed and interpreted, showing subsidence of the  L4/5 and L5/S1 TLIF cages. No lucency around the screws. Screws have not backed out. No fractures seen.    Patient name: Debbie Pineda Patient MRN: [id number] Date of visit: 03/07/22

## 2022-03-12 ENCOUNTER — Other Ambulatory Visit: Payer: Self-pay | Admitting: Psychiatry

## 2022-03-12 DIAGNOSIS — G43909 Migraine, unspecified, not intractable, without status migrainosus: Secondary | ICD-10-CM

## 2022-03-12 NOTE — Telephone Encounter (Signed)
Patient last seen on 12/19/21 Follow up scheduled on 07/10/22

## 2022-03-21 ENCOUNTER — Other Ambulatory Visit: Payer: Self-pay | Admitting: Family Medicine

## 2022-03-21 DIAGNOSIS — Z1231 Encounter for screening mammogram for malignant neoplasm of breast: Secondary | ICD-10-CM

## 2022-03-24 ENCOUNTER — Other Ambulatory Visit (INDEPENDENT_AMBULATORY_CARE_PROVIDER_SITE_OTHER): Payer: Self-pay | Admitting: Gastroenterology

## 2022-03-24 DIAGNOSIS — Z9884 Bariatric surgery status: Secondary | ICD-10-CM

## 2022-03-24 DIAGNOSIS — K219 Gastro-esophageal reflux disease without esophagitis: Secondary | ICD-10-CM

## 2022-03-26 ENCOUNTER — Other Ambulatory Visit (INDEPENDENT_AMBULATORY_CARE_PROVIDER_SITE_OTHER): Payer: Self-pay | Admitting: *Deleted

## 2022-03-27 DIAGNOSIS — M79671 Pain in right foot: Secondary | ICD-10-CM | POA: Diagnosis not present

## 2022-03-27 DIAGNOSIS — M47816 Spondylosis without myelopathy or radiculopathy, lumbar region: Secondary | ICD-10-CM | POA: Diagnosis not present

## 2022-03-27 DIAGNOSIS — G894 Chronic pain syndrome: Secondary | ICD-10-CM | POA: Diagnosis not present

## 2022-03-27 DIAGNOSIS — Z79891 Long term (current) use of opiate analgesic: Secondary | ICD-10-CM | POA: Diagnosis not present

## 2022-03-27 DIAGNOSIS — M5416 Radiculopathy, lumbar region: Secondary | ICD-10-CM | POA: Diagnosis not present

## 2022-03-28 ENCOUNTER — Encounter: Payer: Self-pay | Admitting: Sports Medicine

## 2022-03-28 ENCOUNTER — Ambulatory Visit: Payer: PPO | Admitting: Sports Medicine

## 2022-03-28 ENCOUNTER — Ambulatory Visit: Payer: Self-pay

## 2022-03-28 DIAGNOSIS — M19071 Primary osteoarthritis, right ankle and foot: Secondary | ICD-10-CM

## 2022-03-28 DIAGNOSIS — Z9889 Other specified postprocedural states: Secondary | ICD-10-CM

## 2022-03-28 DIAGNOSIS — M25571 Pain in right ankle and joints of right foot: Secondary | ICD-10-CM | POA: Diagnosis not present

## 2022-03-28 MED ORDER — LIDOCAINE HCL 1 % IJ SOLN
1.0000 mL | INTRAMUSCULAR | Status: AC | PRN
  Administered 2022-03-28: 1 mL

## 2022-03-28 MED ORDER — METHYLPREDNISOLONE ACETATE 40 MG/ML IJ SUSP
40.0000 mg | INTRAMUSCULAR | Status: AC | PRN
  Administered 2022-03-28: 40 mg via INTRA_ARTICULAR

## 2022-03-28 NOTE — Progress Notes (Signed)
Debbie Pineda - 62 y.o. female MRN [id number]  Date of birth: 1960/07/26  Office Visit Note: Visit Date: 03/28/2022 PCP: Jake Samples, PA-C Referred by: Jake Samples, PA*  Subjective: Chief Complaint  Patient presents with   Right Foot - Pain   HPI: Debbie Pineda is a pleasant 62 y.o. female who presents today for evaluation of pain and arthritis of the right midfoot, specifically tarsometatarsal joint.  Right midfoot pain - has a complex history of that with prior bunionectomy, prior hammertoe surgeries of 2 and 3 on the right side.  She reports they did have to move the bone of the first metatarsal with the bunionectomy.  Back on 12/20/2021 we did proceed with an ultrasound-guided injection, this did give her relief for a good amount of time.  She is hoping for a repeat 1 today.  Unable to take NSAIDs given history of Roux-en-Y gastric bypass, on Xarelto   Pertinent ROS were reviewed with the patient and found to be negative unless otherwise specified above in HPI.   Assessment & Plan: Visit Diagnoses:  1. Arthritis of right midfoot   2. Pain in right ankle and joints of right foot   3. S/P bunionectomy    Plan: Discussed her tarsometatarsal joint which does show arthritic changes, she has a prior bunionectomy that is likely making this become symptomatic.  Through shared decision making, elected to proceed with ultrasound-guided injection, she tolerated this well.  She may use ice and activity modification over the next few days.  May use topical Voltaren gel or over-the-counter anti-inflammatories as she is able.  She has been having recurrence of her right SI joint pain, we will have her follow-up next week for ultrasound-guided SI joint injection per Dr. Tawanna Sat request.  Follow-up: Return in about 1 week (around 04/04/2022) for For US-guided R-SIJ inj.   Meds & Orders: No orders of the defined types were placed in this encounter.   Orders Placed This  Encounter  Procedures   Small Joint Inj   US Guided Needle Placement - No Linked Charges     Procedures: Small Joint Inj: R great MTP on 03/28/2022 12:47 PM Indications: pain Details: 25 G needle, ultrasound-guided dorsal approach  Spinal Needle: No  Medications: 1 mL lidocaine 1 %; 40 mg methylPREDNISolone acetate 40 MG/ML Outcome: tolerated well, no immediate complications  US-guided tarsometatarsal joint injection, right: After discussion on the risks/benefits/indications, and informed verbal consent was obtained.  A timeout was then performed.  The patient was lying supine on the examination table with the knee bent in the foot elevated with a flat foot positioning.  The area overlying the first met in tarsometatarsal joint was prepped with ChloraPrep and multiple alcohol swabs.  Ultrasound guidance with sterile gel was used in a long axis plane to identify the joint.  Using ultrasound guidance with an out of plane, walk-down approach the 25-gauge, 1.5" needle was visualized into the tarsometatarsal joint and subsequently injected with a 1:1cc of lidocaine:betamethasone. Patient tolerated the procedure well without immediate complications.  A Band-Aid was then applied.   Procedure, treatment alternatives, risks and benefits explained, specific risks discussed. Consent was given by the patient. Immediately prior to procedure a time out was called to verify the correct patient, procedure, equipment, support staff and site/side marked as required. Patient was prepped and draped in the usual sterile fashion.          Clinical History: No specialty comments available.  She reports that  she quit smoking about 31 years ago. Her smoking use included cigarettes. She has a 6.00 pack-year smoking history. She has been exposed to tobacco smoke. She has never used smokeless tobacco. No results for input(s): "HGBA1C", "LABURIC" in the last 8760 hours.  Objective:    Physical Exam  Gen:  Well-appearing, in no acute distress; non-toxic CV: Regular Rate. Well-perfused. Warm.  Resp: Breathing unlabored on room air; no wheezing. Psych: Fluid speech in conversation; appropriate affect; normal thought process Neuro: Sensation intact throughout. No gross coordination deficits.   Ortho Exam - Right foot: Prior MTP fusion incision that is well-healed. Prior hammertoe surgery with clean incisions. There is no strength with EHL testing, otherwise full range of motion and strength about the ankle and foot. Over the tarsometatarsal joint of the medial right foot.   Imaging: No results found.  Past Medical/Family/Surgical/Social History: Medications & Allergies reviewed per EMR, new medications updated. Patient Active Problem List   Diagnosis Date Noted   PVC's (premature ventricular contractions) 10/11/2021   Factor V Leiden mutation ([place]) 10/11/2021   Other secondary scoliosis, lumbar region    Other spondylosis with radiculopathy, lumbar region    Spondylolisthesis, lumbar region    Thoracic spondylosis with radiculopathy    Radiculopathy, lumbar region    Breakdown (mechanical) of implanted electronic neurostimulator of spinal cord electrode (lead), sequela    Degenerative disc disease, lumbar    At risk for adverse drug event 08/31/2021   Acute on chronic blood loss anemia 08/28/2021   Drug-induced constipation 08/28/2021   GERD without esophagitis 08/28/2021   Overactive bladder 08/28/2021   Chronic migraine without aura 08/28/2021   Hx of blood clots 08/28/2021   Major neurocognitive disorder ([place]) 08/28/2021   Status post lumbar spinal fusion 08/21/2021   Weight gain 07/13/2021   Encounter for removal of internal fixation device 10/21/2020   LFTs abnormal 03/02/2020   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   Gastroesophageal reflux disease 09/22/2019   SSBE (short-segment Barrett's esophagus) 09/22/2019   Flatulence 09/22/2019    Migraine 11/11/2018   Abdominal pain, epigastric 10/14/2018   LUQ pain 10/14/2018   Attention deficit hyperactivity disorder (ADHD) 01/12/2018   Insomnia 01/12/2018   Elevated liver enzymes 09/10/2017   History of diabetes mellitus 06/06/2017   Primary osteoarthritis of right hand 06/06/2017   Transaminasemia    TIA (transient ischemic attack) 05/01/2017   Dysphasia 05/01/2017   Chronic pain syndrome 05/01/2017   Confusion    Presence of right artificial knee joint 09/17/2016   H/O total knee replacement, right 08/15/2016   Presence of retained hardware    Memory disorder 08/14/2016   Cerebrovascular disease 08/14/2016   Unilateral primary osteoarthritis, right knee 05/29/2016   Primary osteoarthritis of both feet 05/29/2016   Trochanteric bursitis of both hips 05/25/2016   Neck pain 05/25/2016   Urinary urgency 04/19/2016   Chronic low back pain 01/11/2016   Total knee replacement status 03/23/2015   Heart palpitations 12/14/2013   Generalized osteoarthritis of multiple sites 11/04/2013   Elevated LFTs 03/17/2012   Cerebral infarction ([place]) 10/30/2011   PFO (patent foramen ovale) 10/30/2011   Acquired hypothyroidism    Thrombophlebitis    OSA on CPAP    Fibromyalgia    Status post bariatric surgery 12/07/2010   Vein disorder 11/29/2010   S/P total hysterectomy and bilateral salpingo-oophorectomy 11/29/2010   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   S/P cholecystectomy 11/29/2010   S/P ACL surgery 11/29/2010   Morbid obesity ([place]) 11/10/2010  Past Medical History:  Diagnosis Date   ADD (attention deficit disorder)    Arthritis    "all over"   Cerebral infarction ([place]) 10/30/2011   Cerebrovascular disease 08/14/2016   Chronic back pain    "all over"   Chronic low back pain [id number]   Complication of anesthesia    tends to have hypotension when NPO and post-anesthesia   Constipation    takes stool softener daily   Degenerative disk disease     Degenerative joint disease    DVT (deep venous thrombosis) ([place]) 2014   RLE   Family history of adverse reaction to anesthesia    a family member woke up during surgery; "think it was my mom"   Fibromyalgia    Generalized osteoarthritis of multiple sites 11/04/2013   GERD (gastroesophageal reflux disease)    Headache    Heart palpitations 12/14/2013   resolved   History of blood clots    superficial   Hypoglycemia    Hypothyroid    takes Synthroid daily   Incomplete emptying of bladder    Insomnia    takes Trazodone nightly   Iron deficiency anemia    takes Ferrous Sulfate daily   Joint pain    Joint swelling    knees and ankles   Memory disorder 08/14/2016   Morbid obesity ([place])    Nausea    takes Zofran as needed.Seeing GI doc   Neck pain 05/25/2016   OSA on CPAP    tested more than 5 yrs ago.     Osteoarthritis    Osteopenia    in feet   PFO (patent foramen ovale)    no murmur   Pre-diabetes    Primary osteoarthritis of both feet 05/29/2016   Right bunionectomy August 2017 by Dr. Sharol Given   PVC's (premature ventricular contractions) 10/11/2021   Monitor 9/23: 1.8% PVCs; rare PACs; o/w NSR   Scoliosis    Skin abnormality 02/04/2020   raised area on lip   Stroke [facility]) "several"last 2014   right foot weakness; memory issues, black spot right visual field since" (03/23/2015)   Thrombophlebitis    Trochanteric bursitis of both hips 05/25/2016   Unilateral primary osteoarthritis, right knee 05/29/2016   Urinary urgency 04/19/2016   Vein disorder 11/29/2010   varicose veins both legs   Wears glasses    Family History  Problem Relation Age of Onset   Cancer Mother        skin and breast   Myasthenia gravis Mother    Breast cancer Mother    Heart disease Father    Cancer Father    Parkinson's disease Father    Heart disease Sister    Heart attack Sister    Heart disease Brother    Cancer Brother    Diabetes Brother    Stroke Brother    Cancer Maternal  Grandfather    Hypothyroidism Daughter    Hypertension Other    Colon cancer Neg Hx    Past Surgical History:  Procedure Laterality Date   BIOPSY  11/14/2018   Procedure: BIOPSY;  Surgeon: Rogene Houston, MD;  [facility];  Service: Endoscopy;;  esophagusgastric   BONE EXCISION Right 08/29/2017   Procedure: right trapezium excision;  Surgeon: Daryll Brod, MD;  Location: [place];  Service: Orthopedics;  Laterality: Right;   BUNIONECTOMY Right 08/2015   CARDIAC CATHETERIZATION     2008.  "it was fine" (not sure why she had it done, and  doesn't know where)   CARPOMETACARPEL SUSPENSION PLASTY Right 08/29/2017   Procedure: SUSPENSION PLASTY RIGHT THUMB;  Surgeon: Daryll Brod, MD;  Location: [place];  Service: Orthopedics;  Laterality: Right;   COLONOSCOPY N/A 03/25/2013   Procedure: COLONOSCOPY;  Surgeon: Rogene Houston, MD;  [facility];  Service: Endoscopy;  Laterality: N/A;  930   ERCP  06/02/2020   ESOPHAGOGASTRODUODENOSCOPY     ESOPHAGOGASTRODUODENOSCOPY (EGD) WITH PROPOFOL N/A 11/14/2018   Procedure: ESOPHAGOGASTRODUODENOSCOPY (EGD) WITH PROPOFOL;  Surgeon: Rogene Houston, MD;  [facility];  Service: Endoscopy;  Laterality: N/A;  1:55pm-office moved to 11:00am/pt notified to arrive at 9:30am per [place]     "took fallopian tubes out"   [place] Right 02/12/2020   Procedure: HALLUX INTERPHANGEAL JOINT  FUSION;  Surgeon: Felipa Furnace, DPM;  Location: [place];  Service: Podiatry;  Laterality: Right;   HAMMER TOE SURGERY Right 02/12/2020   Procedure: SECOND AND THIRD HAMMER TOE CORRECTION; CAPSULOTOMY SECOND INTERPHALANGEAL JOINT;  Surgeon: Felipa Furnace, DPM;  Location: [place];  Service: Podiatry;  Laterality: Right;   JOINT REPLACEMENT     bil knee    KNEE ARTHROSCOPY Left    KNEE ARTHROSCOPY W/ ACL RECONSTRUCTION Right yrs ago   "added pins"    LAPAROSCOPIC CHOLECYSTECTOMY  ~ 2001   ROUX-EN-Y GASTRIC BYPASS  11/20/2010   [place]   SPINAL CORD STIMULATOR INSERTION N/A 04/18/2017   Procedure: LUMBAR SPINAL CORD STIMULATOR INSERTION;  Surgeon: Clydell Hakim, MD;  Location: [place];  Service: Neurosurgery;  Laterality: N/A;  LUMBAR SPINAL CORD STIMULATOR INSERTION   stomach stent  04/28/2020   TENDON TRANSFER Right 08/29/2017   Procedure: right abductor pollicis longus transfer;  Surgeon: Daryll Brod, MD;  Location: [place];  Service: Orthopedics;  Laterality: Right;   TOTAL KNEE ARTHROPLASTY Left 03/23/2015   Procedure: TOTAL KNEE ARTHROPLASTY;  Surgeon: Newt Minion, MD;  Location: [place];  Service: Orthopedics;  Laterality: Left;   TOTAL KNEE ARTHROPLASTY Right 08/15/2016   Procedure: RIGHT TOTAL KNEE ARTHROPLASTY, REMOVAL ACL SCREWS;  Surgeon: Newt Minion, MD;  Location: [place];  Service: Orthopedics;  Laterality: Right;   TOTAL KNEE ARTHROPLASTY WITH HARDWARE REMOVAL Right    VAGINAL HYSTERECTOMY     tah/bso   VARICOSE VEIN SURGERY Right X 2   WEIL OSTEOTOMY Right 02/12/2020   Procedure: DOUBLE L OSTEOTOMY;  Surgeon: Felipa Furnace, DPM;  Location: [place];  Service: Podiatry;  Laterality: Right;   Social History   Occupational History   Occupation: Disability   Occupation: formerly Therapist, sports, [employer]  Tobacco Use   Smoking status: Former    Packs/day: 0.75    Years: 8.00    Total pack years: 6.00    Types: Cigarettes    Quit date: 12/01/1990    Years since quitting: 31.3    Passive exposure: Past   Smokeless tobacco: Never  Vaping Use   Vaping Use: Never used  Substance and Sexual Activity   Alcohol use: No    Comment: 03/23/2015 "stopped drinking in 2012 w/gastric bypass; drank socially before bypass"   Drug use: No   Sexual activity: Not Currently    Birth control/protection: Surgical

## 2022-03-30 ENCOUNTER — Ambulatory Visit
Admission: RE | Admit: 2022-03-30 | Discharge: 2022-03-30 | Disposition: A | Discharge status: Home / Self Care | Payer: PPO | Source: Ambulatory Visit

## 2022-03-30 DIAGNOSIS — Z1231 Encounter for screening mammogram for malignant neoplasm of breast: Secondary | ICD-10-CM | POA: Diagnosis not present

## 2022-03-30 DIAGNOSIS — F1120 Opioid dependence, uncomplicated: Secondary | ICD-10-CM | POA: Diagnosis not present

## 2022-03-30 DIAGNOSIS — Z1331 Encounter for screening for depression: Secondary | ICD-10-CM | POA: Diagnosis not present

## 2022-03-30 DIAGNOSIS — M519 Unspecified thoracic, thoracolumbar and lumbosacral intervertebral disc disorder: Secondary | ICD-10-CM | POA: Diagnosis not present

## 2022-03-30 DIAGNOSIS — E782 Mixed hyperlipidemia: Secondary | ICD-10-CM | POA: Diagnosis not present

## 2022-03-30 DIAGNOSIS — E6609 Other obesity due to excess calories: Secondary | ICD-10-CM | POA: Diagnosis not present

## 2022-03-30 DIAGNOSIS — R7309 Other abnormal glucose: Secondary | ICD-10-CM | POA: Diagnosis not present

## 2022-03-30 DIAGNOSIS — E2839 Other primary ovarian failure: Secondary | ICD-10-CM | POA: Diagnosis not present

## 2022-03-30 DIAGNOSIS — R32 Unspecified urinary incontinence: Secondary | ICD-10-CM | POA: Diagnosis not present

## 2022-03-30 DIAGNOSIS — Z0001 Encounter for general adult medical examination with abnormal findings: Secondary | ICD-10-CM | POA: Diagnosis not present

## 2022-03-30 DIAGNOSIS — E039 Hypothyroidism, unspecified: Secondary | ICD-10-CM | POA: Diagnosis not present

## 2022-03-30 DIAGNOSIS — Z6841 Body Mass Index (BMI) 40.0 and over, adult: Secondary | ICD-10-CM | POA: Diagnosis not present

## 2022-03-30 DIAGNOSIS — G43909 Migraine, unspecified, not intractable, without status migrainosus: Secondary | ICD-10-CM | POA: Diagnosis not present

## 2022-03-30 DIAGNOSIS — F902 Attention-deficit hyperactivity disorder, combined type: Secondary | ICD-10-CM | POA: Diagnosis not present

## 2022-03-30 DIAGNOSIS — I10 Essential (primary) hypertension: Secondary | ICD-10-CM | POA: Diagnosis not present

## 2022-03-30 DIAGNOSIS — E119 Type 2 diabetes mellitus without complications: Secondary | ICD-10-CM | POA: Diagnosis not present

## 2022-04-03 DIAGNOSIS — G4733 Obstructive sleep apnea (adult) (pediatric): Secondary | ICD-10-CM | POA: Diagnosis not present

## 2022-04-04 ENCOUNTER — Ambulatory Visit: Payer: Self-pay

## 2022-04-04 ENCOUNTER — Ambulatory Visit (INDEPENDENT_AMBULATORY_CARE_PROVIDER_SITE_OTHER): Payer: PPO | Admitting: Sports Medicine

## 2022-04-04 ENCOUNTER — Encounter: Payer: Self-pay | Admitting: Sports Medicine

## 2022-04-04 DIAGNOSIS — M533 Sacrococcygeal disorders, not elsewhere classified: Secondary | ICD-10-CM | POA: Diagnosis not present

## 2022-04-04 DIAGNOSIS — G8929 Other chronic pain: Secondary | ICD-10-CM

## 2022-04-04 NOTE — Progress Notes (Signed)
   Procedure Note  Patient: Debbie Pineda             Date of Birth: January 28, 1960           MRN: [id number]             Visit Date: 04/04/2022  Procedures: Visit Diagnoses:  1. Chronic right SI joint pain    U/S-guided SI-joint injection, right   After discussion of risk/benefits/indications, informed verbal consent was obtained. A timeout was then performed. The patient was positioned in a prone position on exam room table with a pillow placed under the pelvis for mild hip flexion. The SI joint area was cleaned and prepped with betadine and alcohol swabs. Sterile ultrasound gel was applied and the ultrasound transducer was placed in an anatomic axial plane over the PSIS, then moved distally over the SI-joint. Using ultrasound guidance, a 22-gauge, 3.5" needle was inserted from a medial to lateral approach utilizing an in-plane approach and directed into the SI-joint. The SI-joint was then injected with a mixture of 4:2 lidocaine:depomedrol with visualization of the injectate flow into the SI-joint under ultrasound visualization. The patient tolerated the procedure well without immediate complications.  - I evaluated the patient about 10 minutes post-injection and she had improvement in pain and range of motion - recommended cat/cow exercises for the low back, core stability; she is working to get back into the pool at [employer], I think this is a good idea - follow-up with Dr. Laurance Flatten as indicated for her back pain; I am happy to see her for this or other conditions as needed  Elba Barman, DO [place]  This note was dictated using Dragon naturally speaking software and may contain errors in syntax, spelling, or content which have not been identified prior to signing this note.

## 2022-04-17 ENCOUNTER — Other Ambulatory Visit: Payer: Self-pay

## 2022-04-17 MED ORDER — METAFOLBIC PLUS 6-2-600 MG PO TABS: 1.0000 | ORAL_TABLET | Freq: Every day | ORAL | 3 refills | Status: DC

## 2022-04-24 DIAGNOSIS — M79671 Pain in right foot: Secondary | ICD-10-CM | POA: Diagnosis not present

## 2022-04-24 DIAGNOSIS — M5416 Radiculopathy, lumbar region: Secondary | ICD-10-CM | POA: Diagnosis not present

## 2022-04-24 DIAGNOSIS — M47816 Spondylosis without myelopathy or radiculopathy, lumbar region: Secondary | ICD-10-CM | POA: Diagnosis not present

## 2022-04-24 DIAGNOSIS — G894 Chronic pain syndrome: Secondary | ICD-10-CM | POA: Diagnosis not present

## 2022-05-10 DIAGNOSIS — E8941 Symptomatic postprocedural ovarian failure: Secondary | ICD-10-CM | POA: Diagnosis not present

## 2022-05-10 DIAGNOSIS — K921 Melena: Secondary | ICD-10-CM | POA: Diagnosis not present

## 2022-05-10 DIAGNOSIS — Z6841 Body Mass Index (BMI) 40.0 and over, adult: Secondary | ICD-10-CM | POA: Diagnosis not present

## 2022-05-10 DIAGNOSIS — F902 Attention-deficit hyperactivity disorder, combined type: Secondary | ICD-10-CM | POA: Diagnosis not present

## 2022-05-11 LAB — LAB REPORT - SCANNED: EGFR: 65

## 2022-05-15 ENCOUNTER — Encounter (INDEPENDENT_AMBULATORY_CARE_PROVIDER_SITE_OTHER): Payer: Self-pay | Admitting: Gastroenterology

## 2022-05-15 ENCOUNTER — Ambulatory Visit (INDEPENDENT_AMBULATORY_CARE_PROVIDER_SITE_OTHER): Payer: PPO | Admitting: Gastroenterology

## 2022-05-15 VITALS — BP 103/75 | HR 83 | Temp 98.8°F | Ht 65.0 in | Wt 266.9 lb

## 2022-05-15 DIAGNOSIS — R1012 Left upper quadrant pain: Secondary | ICD-10-CM

## 2022-05-15 DIAGNOSIS — K921 Melena: Secondary | ICD-10-CM | POA: Diagnosis not present

## 2022-05-15 NOTE — Patient Instructions (Signed)
Continue omeprazole and famotidine  Continue carafate 4x/day Please avoid NSAIDs (advil, aleve, naproxen, goody powder, ibuprofen, celbrex, bc powders) as these can be very hard on your GI tract, causing inflammation, ulcers and damage to the lining of your GI tract. I would discuss other pain therapies with the provider who manages your pain. I will discuss EGD to be done here vs at [facility] with Dr. Levon Hedger and let you know what the recommendation is  Please keep follow up you have scheduled in June 2024

## 2022-05-15 NOTE — H&P (View-Only) (Signed)
 Referring Provider: Jackson, Samantha J, PA* Primary Care Physician:  Jackson, Samantha J, PA-C Primary GI Physician: Castaneda   Chief Complaint  Patient presents with   dark stool    Has noticed for several months she has dark stools. Has noticed it is when she is taking celebrex. If she stopps celebrex her stools return to normal.    HPI:   Debbie Pineda is a 62 y.o. 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   Patient presenting today for dark stools   Last seen December 2023, at that time, doing well on Movantik, having atleast 1 BM per day. Rare abdominal pain. She denies rectal bleeding or melena.  Maintained on Omeprazole 40mg BID and famotidine 40mg QHS, She has occasional GERD symptoms, maybe once per week, usually related to something she ate or if she ate later.  occasional nausea, usually only if she eats something very sweet like ice cream. She sometimes will note some gas bubbles that occur when she has nausea, sometimes can lay on left side and bubbles will discipate and symptoms improve. doing reglan usually BID, occasionally TID with good results.    She did have repeat EGD in November with [facility] due to gastrocolic fistula, findings as below. Per notes, it was recommended she follow up with them and have repeat EGD in 6 months, however, patient states she was not aware she was to follow up with them.   Recommended to continue with PPI BID, movantik, reglan BID, follow up with [facility] on repeat EGD for gastrogastricfistula.  Present:  She has been taking celebrex for the past few months for her joint pain, she has noticed melena for the last few months as well. No BRBPR. She notes some LUQ pain. She notes that she has stopped celebrex intermittently and melena would  stop. Appetite is good. Does not feel that eating makes her pain better or worse. She has chronic nausea, this occurs intermittently, notes a lot of gas after eating, especially with certain foods such as meat. She has to go lay down on her side often after a meal to help release some of the gas she is feeling which helps. She saw PCP last week and was advised to stop celebrex, Hgb 14 last week. She was started on carafate 1g QID.   She notes that she was unable to follow up with [facility] to repeat EGD as she had to have a back surgery in the meantime.   She continues on movantik for her constipation, also doing 3 colace per night, having a BM daily to every other day.   EGD: 12/06/21  - Roux-en-y gastric bypass with dilated GJ anastamosis   (>15mm), several surgical staples removed.  - Persistent GG Fistula s/p successful partial closure using APC and endoscopic suturing  - Evidence of prior Ampullotomy without residual  adenomatous changes  - Otherwise normal remnant stomach and duodenum  CT A/P with contrast 09/06/21 Prominent stool throughout the colon favors constipation. 2. Other imaging findings of potential clinical significance: Small right renal angiomyolipoma. Pneumobilia. Prior cholecystectomy. Prior gastric bypass. Extensive postoperative findings in the thoracolumbar spine. ERCP: 06/02/20: No specimens collected.  - Gastric bypass. Gastrojejunal anastomosis characterized by healthy appearing mucosa. - Pre-existing gastrogastrostomy AXIOS stent. Removed and replaced with two 10Fr x 4cm double pigtail stents to maintain patency (  given the anticipated need for  ampullary surveillance in the future). - A single ampullary polyp (adenomatous) Snare papillectomy of the  major papilla was performed. Resected and retrieved  with hemostasis achieved through the use of snare tip soft coagulation. - The entire main bile duct was mildly dilated. acquired. - A biliary sphincterotomy was performed.  - The  biliary tree was swept and sludge was found. (Recommended repeat EGD in 6-12 months, pending path)                    Upper Endoscopy:04/28/20  (EUS) The examined esophagus was endoscopically normal. Evidence of a gastric bypass was found. A gastric pouch with a small size was found. The staple line appeared intact. The gastrojejunal anastomosis was characterized by healthy appearing mucosa. This was traversed. The pouch-to-jejunum limb was characterized by healthy  appearing mucosa. The examined jejunum was normal.  Last Colonoscopy:march 2015, normal, small eternal hemorrhoids repeat in 10 years   Past Medical History:  Diagnosis Date   ADD (attention deficit disorder)    Arthritis    "all over"   Cerebral infarction (HCC) 10/30/2011   Cerebrovascular disease 08/14/2016   Chronic back pain    "all over"   Chronic low back pain 01/11/2016   Complication of anesthesia    tends to have hypotension when NPO and post-anesthesia   Constipation    takes stool softener daily   Degenerative disk disease    Degenerative joint disease    DVT (deep venous thrombosis) (HCC) 2014   RLE   Family history of adverse reaction to anesthesia    a family member woke up during surgery; "think it was my mom"   Fibromyalgia    Generalized osteoarthritis of multiple sites 11/04/2013   GERD (gastroesophageal reflux disease)    Headache    Heart palpitations 12/14/2013   resolved   History of blood clots    superficial   Hypoglycemia    Hypothyroid    takes Synthroid daily   Incomplete emptying of bladder    Insomnia    takes Trazodone nightly   Iron deficiency anemia    takes Ferrous Sulfate daily   Joint pain    Joint swelling    knees and ankles   Memory disorder 08/14/2016   Morbid obesity (HCC)    Nausea    takes Zofran as needed.Seeing GI doc   Neck pain 05/25/2016   OSA on CPAP    tested more than 5 yrs ago.     Osteoarthritis    Osteopenia    in feet   PFO (patent foramen ovale)     no murmur   Pre-diabetes    Primary osteoarthritis of both feet 05/29/2016   Right bunionectomy August 2017 by Dr. Duda   PVC's (premature ventricular contractions) 10/11/2021   Monitor 9/23: 1.8% PVCs; rare PACs; o/w NSR   Scoliosis    Skin abnormality 02/04/2020   raised area on lip   Stroke (HCC) "several"last 2014   right foot weakness; memory issues, black spot right visual field since" (03/23/2015)   Thrombophlebitis    Trochanteric bursitis of both hips 05/25/2016   Unilateral primary osteoarthritis, right knee 05/29/2016   Urinary urgency 04/19/2016   Vein disorder 11/29/2010   varicose veins both legs   Wears glasses     Past Surgical History:  Procedure Laterality Date   BIOPSY  11/14/2018   Procedure: BIOPSY;  Surgeon: Rehman, Najeeb U, MD;  Location: [facility];    Service: Endoscopy;;  esophagusgastric   BONE EXCISION Right 08/29/2017   Procedure: right trapezium excision;  Surgeon: Kuzma, Gary, MD;  Location: [facility];  Service: Orthopedics;  Laterality: Right;   BUNIONECTOMY Right 08/2015   CARDIAC CATHETERIZATION     2008.  "it was fine" (not sure why she had it done, and doesn't know where)   CARPOMETACARPEL SUSPENSION PLASTY Right 08/29/2017   Procedure: SUSPENSION PLASTY RIGHT THUMB;  Surgeon: Kuzma, Gary, MD;  Location: [facility];  Service: Orthopedics;  Laterality: Right;   COLONOSCOPY N/A 03/25/2013   Procedure: COLONOSCOPY;  Surgeon: Najeeb U Rehman, MD;  Location: [facility];  Service: Endoscopy;  Laterality: N/A;  930   ERCP  06/02/2020   ESOPHAGOGASTRODUODENOSCOPY     ESOPHAGOGASTRODUODENOSCOPY (EGD) WITH PROPOFOL N/A 11/14/2018   Procedure: ESOPHAGOGASTRODUODENOSCOPY (EGD) WITH PROPOFOL;  Surgeon: Rehman, Najeeb U, MD;  Location: [facility];  Service: Endoscopy;  Laterality: N/A;  1:55pm-office moved to 11:00am/pt notified to arrive at 9:30am per KF   EXPLORATORY LAPAROTOMY     "took fallopian tubes out"   HALLUX  FUSION Right 02/12/2020   Procedure: HALLUX INTERPHANGEAL JOINT  FUSION;  Surgeon: Patel, Kevin P, DPM;  Location: [facility];  Service: Podiatry;  Laterality: Right;   HAMMER TOE SURGERY Right 02/12/2020   Procedure: SECOND AND THIRD HAMMER TOE CORRECTION; CAPSULOTOMY SECOND INTERPHALANGEAL JOINT;  Surgeon: Patel, Kevin P, DPM;  Location: [facility];  Service: Podiatry;  Laterality: Right;   JOINT REPLACEMENT     bil knee    KNEE ARTHROSCOPY Left    KNEE ARTHROSCOPY W/ ACL RECONSTRUCTION Right yrs ago   "added pins"   LAPAROSCOPIC CHOLECYSTECTOMY  ~ 2001   ROUX-EN-Y GASTRIC BYPASS  11/20/2010   [place]   SPINAL CORD STIMULATOR INSERTION N/A 04/18/2017   Procedure: LUMBAR SPINAL CORD STIMULATOR INSERTION;  Surgeon: Harkins, Paul, MD;  Location: [facility];  Service: Neurosurgery;  Laterality: N/A;  LUMBAR SPINAL CORD STIMULATOR INSERTION   stomach stent  04/28/2020   TENDON TRANSFER Right 08/29/2017   Procedure: right abductor pollicis longus transfer;  Surgeon: Kuzma, Gary, MD;  Location: [facility];  Service: Orthopedics;  Laterality: Right;   TOTAL KNEE ARTHROPLASTY Left 03/23/2015   Procedure: TOTAL KNEE ARTHROPLASTY;  Surgeon: Marcus Duda V, MD;  Location: [facility];  Service: Orthopedics;  Laterality: Left;   TOTAL KNEE ARTHROPLASTY Right 08/15/2016   Procedure: RIGHT TOTAL KNEE ARTHROPLASTY, REMOVAL ACL SCREWS;  Surgeon: Duda, Marcus V, MD;  Location: [facility];  Service: Orthopedics;  Laterality: Right;   TOTAL KNEE ARTHROPLASTY WITH HARDWARE REMOVAL Right    VAGINAL HYSTERECTOMY     tah/bso   VARICOSE VEIN SURGERY Right X 2   WEIL OSTEOTOMY Right 02/12/2020   Procedure: DOUBLE L OSTEOTOMY;  Surgeon: Patel, Kevin P, DPM;  Location: [facility];  Service: Podiatry;  Laterality: Right;    Current Outpatient Medications  Medication Sig Dispense Refill   baclofen (LIORESAL) 10 MG tablet Take 1 tablet (10 mg total) by mouth 3 (three) times  daily. 90 each 0   Blood Glucose Monitoring Suppl (FREESTYLE PRECISION NEO SYSTEM) w/Device KIT USE 1 STRIP TO CHECK GLUCOSE 4 TIMES DAILY AND AS NEEDED     celecoxib (CELEBREX) 200 MG capsule Take by mouth. Takes as needed     Cholecalciferol (VITAMIN D3) 125 MCG (5000 UT) TABS Take 5,000 Units by mouth daily.     Cyanocobalamin (VITAMIN B-12) 5000 MCG   SUBL Place 5,000 mcg under the tongue daily.      diclofenac sodium (VOLTAREN) 1 % GEL Apply 4 g topically 4 (four) times daily as needed (PAIN). 5 Tube 1   donepezil (ARICEPT) 10 MG tablet Take 1 tablet by mouth at bedtime. 90 tablet 2   DULoxetine (CYMBALTA) 60 MG capsule Take 1 capsule (60 mg total) by mouth 2 (two) times daily. 60 capsule 0   famotidine (PEPCID) 40 MG tablet Take 1 tablet (40 mg total) by mouth at bedtime. 30 tablet 0   ferrous gluconate (FERGON) 324 MG tablet Take 1 tablet (324 mg total) by mouth 2 (two) times daily with a meal. 60 tablet 0   folic acid (FOLVITE) 400 MCG tablet Take 400 mcg by mouth daily.     gabapentin (NEURONTIN) 800 MG tablet 800 mg. One tid     Galcanezumab-gnlm (EMGALITY) 120 MG/ML SOSY INJECT 120 MG SUBCUTANEOUSLY ONCE EVERY MONTH 1 mL 3   Krill Oil 350 MG CAPS Take 350 mg by mouth at bedtime.      levothyroxine (SYNTHROID) 125 MCG tablet Take 1 tablet (125 mcg total) by mouth daily before breakfast. 30 tablet 0   Methylfol-Methylcob-Acetylcyst (METAFOLBIC PLUS) 6-2-600 MG TABS Take 1 tablet by mouth daily. 30 tablet 3   methylphenidate (CONCERTA) 27 MG PO CR tablet Take 1 tablet (27 mg total) by mouth 2 (two) times daily. 10 am & 1400 60 tablet 0   metoCLOPramide (REGLAN) 5 MG tablet Take 1 tablet by mouth twice daily. 60 tablet 2   miconazole (ZEASORB-AF) 2 % powder Every shift as needed. Apply to red areas in folds of skin.     mirabegron ER (MYRBETRIQ) 50 MG TB24 tablet Take 1 tablet (50 mg total) by mouth daily. 30 tablet 0   Multiple Minerals-Vitamins (CAL-MAG-ZINC-D PO) Take 3 tablets by mouth  daily. 333 mg-133 unit -133 mg-5 mg     Multiple Vitamins-Minerals (ONE-A-DAY WOMENS PETITES PO) Take 1 tablet by mouth 2 (two) times daily.     naloxegol oxalate (MOVANTIK) 25 MG TABS tablet TAKE (1) TABLET BY MOUTH ONCE DAILY. 30 tablet 3   NARCAN 4 MG/0.1ML LIQD nasal spray kit Place 0.1 sprays (0.4 mg total) into the nose 2 (two) times daily as needed. 1 each 0   NUCYNTA ER 100 MG 12 hr tablet SMARTSIG:1 Tablet(s) By Mouth Every 12 Hours     omeprazole (PRILOSEC) 40 MG capsule Take 1 capsule by mouth twice daily. 60 capsule 2   ondansetron (ZOFRAN-ODT) 4 MG disintegrating tablet Take 1 tablet (4 mg total) by mouth every 8 (eight) hours as needed for nausea or vomiting. 20 tablet 0   ONETOUCH ULTRA test strip      OneTouch UltraSoft 2 Lancets MISC      Oxycodone HCl 10 MG TABS Take 10 mg by mouth every 6 (six) hours as needed.     Rimegepant Sulfate (NURTEC) 75 MG TBDP Take 75 mg by mouth daily as needed. 8 tablet 0   rivaroxaban (XARELTO) 20 MG TABS tablet Take 1 tablet (20 mg total) by mouth daily at 6 PM. 30 tablet 0   tiZANidine (ZANAFLEX) 4 MG tablet Take 1 tablet (4 mg total) by mouth every 6 (six) hours as needed for muscle spasms. 30 tablet 0   topiramate (TOPAMAX) 100 MG tablet Take 1 tablet by mouth twice daily. 180 tablet 1   traZODone (DESYREL) 100 MG tablet Take 0.5 tablets (50 mg total) by mouth at bedtime. 15 tablet   0   triamcinolone cream (KENALOG) 0.1 % SMARTSIG:1 Application Topical 2-3 Times Daily     trospium (SANCTURA) 20 MG tablet Take 1 tablet (20 mg total) by mouth 2 (two) times daily. 60 tablet 0   Ubrogepant (UBRELVY) 100 MG TABS Take 100 mg by mouth as needed. 2 tablet 0   vitamin C (ASCORBIC ACID) 500 MG tablet Take 500 mg by mouth daily.     No current facility-administered medications for this visit.    Allergies as of 05/15/2022 - Review Complete 05/15/2022  Allergen Reaction Noted   Lyrica [pregabalin] Shortness Of Breath and Swelling 12/01/2015   Belsomra  [suvorexant] Other (See Comments) 12/01/2015   Morphine and related Itching 10/26/2010   Sulfamethoxazole-trimethoprim Itching and Rash 03/10/2015   Tape Itching and Rash 03/10/2015    Family History  Problem Relation Age of Onset   Cancer Mother        skin and breast   Myasthenia gravis Mother    Breast cancer Mother    Heart disease Father    Cancer Father    Parkinson's disease Father    Heart disease Sister    Heart attack Sister    Heart disease Brother    Cancer Brother    Diabetes Brother    Stroke Brother    Cancer Maternal Grandfather    Hypothyroidism Daughter    Hypertension Other    Colon cancer Neg Hx     Social History   Socioeconomic History   Marital status: Married    Spouse name: James   Number of children: 1   Years of education: 14   Highest education level: Not on file  Occupational History   Occupation: Disability   Occupation: formerly RN, HHN  Tobacco Use   Smoking status: Former    Packs/day: 0.75    Years: 8.00    Additional pack years: 0.00    Total pack years: 6.00    Types: Cigarettes    Quit date: 12/01/1990    Years since quitting: 31.4    Passive exposure: Past   Smokeless tobacco: Never  Vaping Use   Vaping Use: Never used  Substance and Sexual Activity   Alcohol use: No    Comment: 03/23/2015 "stopped drinking in 2012 w/gastric bypass; drank socially before bypass"   Drug use: No   Sexual activity: Not Currently    Birth control/protection: Surgical  Other Topics Concern   Not on file  Social History Narrative   Lives with husband   Caffeine use: No soda   Mainly water, drinks decaf tea   Right handed   Social Determinants of Health   Financial Resource Strain: Not on file  Food Insecurity: Not on file  Transportation Needs: Not on file  Physical Activity: Not on file  Stress: Not on file  Social Connections: Not on file    Review of systems General: negative for malaise, night sweats, fever, chills, weight  loss Neck: Negative for lumps, goiter, pain and significant neck swelling Resp: Negative for cough, wheezing, dyspnea at rest CV: Negative for chest pain, leg swelling, palpitations, orthopnea GI: denies hematochezia, vomiting, diarrhea, constipation, dysphagia, odyonophagia, early satiety or unintentional weight loss. +melena +LUQ pain +nausea  MSK: Negative for joint pain or swelling, back pain, and muscle pain. Derm: Negative for itching or rash Psych: Denies depression, anxiety, memory loss, confusion. No homicidal or suicidal ideation.  Heme: Negative for prolonged bleeding, bruising easily, and swollen nodes. Endocrine: Negative for cold or heat intolerance,   polyuria, polydipsia and goiter. Neuro: negative for tremor, gait imbalance, syncope and seizures. The remainder of the review of systems is noncontributory.  Physical Exam: There were no vitals taken for this visit. General:   Alert and oriented. No distress noted. Pleasant and cooperative.  Head:  Normocephalic and atraumatic. Eyes:  Conjuctiva clear without scleral icterus. Mouth:  Oral mucosa pink and moist. Good dentition. No lesions. Heart: Normal rate and rhythm, s1 and s2 heart sounds present.  Lungs: Clear lung sounds in all lobes. Respirations equal and unlabored. Abdomen:  +BS, soft, and non-distended. TTP of LUQ. No rebound or guarding. No HSM or masses noted. Derm: No palmar erythema or jaundice Msk:  Symmetrical without gross deformities. Normal posture. Extremities:  Without edema. Neurologic:  Alert and  oriented x4 Psych:  Alert and cooperative. Normal mood and affect.  Invalid input(s): "6 MONTHS"   ASSESSMENT: Debbie Pineda is a 61 y.o. female presenting today for melena and LUQ pain  Melena/LUQ Pain: noting symptoms for the past few months in setting of celebrex use. No BRBPR. Denies weight loss, vomiting. She has chronic nausea but this appears well controlled. Eating does not worsen or alleviate  her pain. She was started on carafate 1g QID by PCP and hgb last week was 14. She was advised then to stop celebrex and I discussed avoiding all NSAIDs at this time. Ideally she needs an EGD for further evaluation as I cannot rule out PUD, gastritis, duodenitis, however, she was to have follow up EGD at [facility] in May for history of her gastrogastric fistula but was lost to follow up for this. I will discuss with Dr. Castaneda performing EGD for evaluation of melena here vs. At [facility]. For now she should continue with PPI, H2B and Carafate.   PLAN:  Continue with PPI and H2B  2.  Continue carafate 1g QID 3. Discuss EGD here vs. [facility] with Dr. Castaneda  4. Stop NSAIDs, discuss other pain therapies with pain management  5. Will need [facility] follow up for gastrogastric fistula   All questions were answered, patient verbalized understanding and is in agreement with plan as outlined above.   Follow Up: Has f/u in June   Joanthony Hamza L. Deklyn Trachtenberg, MSN, APRN, AGNP-C Adult-Gerontology Nurse Practitioner [facility]  **addendum, spoke with Dr. Castaneda regarding EGD, recommend proceeding with EGD here for evaluation of melena, patient will still need to follow up at [facility] for repeat EGD for gastrogastric fistula thereafter, I reached out to the patient via telephone to discuss recommendations, Indications, risks and benefits of procedure discussed in detail with patient. Patient verbalized understanding and is in agreement to proceed with EGD.    I have reviewed the note and agree with the APP's assessment as described in this progress note  Daniel Castaneda, MD Gastroenterology and Hepatology [facility]  

## 2022-05-15 NOTE — Progress Notes (Signed)
Referring Provider: Avis Epley, PA* Primary Care Physician:  Avis Epley, PA-C Primary GI Physician: Levon Hedger   Chief Complaint  Patient presents with   dark stool    Has noticed for several months she has dark stools. Has noticed it is when she is taking celebrex. If she stopps celebrex her stools return to normal.    HPI:   Debbie Pineda is a 62 y.o. 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   Patient presenting today for dark stools   Last seen December 2023, at that time, doing well on Movantik, having atleast 1 BM per day. Rare abdominal pain. She denies rectal bleeding or melena.  Maintained on Omeprazole 40mg  BID and famotidine 40mg  QHS, She has occasional GERD symptoms, maybe once per week, usually related to something she ate or if she ate later.  occasional nausea, usually only if she eats something very sweet like ice cream. She sometimes will note some gas bubbles that occur when she has nausea, sometimes can lay on left side and bubbles will discipate and symptoms improve. doing reglan usually BID, occasionally TID with good results.    She did have repeat EGD in November with [facility] due to gastrocolic fistula, findings as below. Per notes, it was recommended she follow up with them and have repeat EGD in 6 months, however, patient states she was not aware she was to follow up with them.   Recommended to continue with PPI BID, movantik, reglan BID, follow up with [facility] on repeat EGD for gastrogastricfistula.  Present:  She has been taking celebrex for the past few months for her joint pain, she has noticed melena for the last few months as well. No BRBPR. She notes some LUQ pain. She notes that she has stopped celebrex intermittently and melena would  stop. Appetite is good. Does not feel that eating makes her pain better or worse. She has chronic nausea, this occurs intermittently, notes a lot of gas after eating, especially with certain foods such as meat. She has to go lay down on her side often after a meal to help release some of the gas she is feeling which helps. She saw PCP last week and was advised to stop celebrex, Hgb 14 last week. She was started on carafate 1g QID.   She notes that she was unable to follow up with [facility] to repeat EGD as she had to have a back surgery in the meantime.   She continues on movantik for her constipation, also doing 3 colace per night, having a BM daily to every other day.   EGD: 12/06/21  - Roux-en-y gastric bypass with dilated GJ anastamosis   (>21mm), several surgical staples removed.  - Persistent GG Fistula s/p successful partial closure using APC and endoscopic suturing  - Evidence of prior Ampullotomy without residual  adenomatous changes  - Otherwise normal remnant stomach and duodenum  CT A/P with contrast 09/06/21 Prominent stool throughout the colon favors constipation. 2. Other imaging findings of potential clinical significance: Small right renal angiomyolipoma. Pneumobilia. Prior cholecystectomy. Prior gastric bypass. Extensive postoperative findings in the thoracolumbar spine. ERCP: 06/02/20: No specimens collected.  - Gastric bypass. Gastrojejunal anastomosis characterized by healthy appearing mucosa. - Pre-existing gastrogastrostomy AXIOS stent. Removed and replaced with two 10Fr x 4cm double pigtail stents to maintain patency (  given the anticipated need for  ampullary surveillance in the future). - A single ampullary polyp (adenomatous) Snare papillectomy of the  major papilla was performed. Resected and retrieved  with hemostasis achieved through the use of snare tip soft coagulation. - The entire main bile duct was mildly dilated. acquired. - A biliary sphincterotomy was performed.  - The  biliary tree was swept and sludge was found. (Recommended repeat EGD in 6-12 months, pending path)                    Upper Endoscopy:04/28/20  (EUS) The examined esophagus was endoscopically normal. Evidence of a gastric bypass was found. A gastric pouch with a small size was found. The staple line appeared intact. The gastrojejunal anastomosis was characterized by healthy appearing mucosa. This was traversed. The pouch-to-jejunum limb was characterized by healthy  appearing mucosa. The examined jejunum was normal.  Last Colonoscopy:march 2015, normal, small eternal hemorrhoids repeat in 10 years   Past Medical History:  Diagnosis Date   ADD (attention deficit disorder)    Arthritis    "all over"   Cerebral infarction (HCC) 10/30/2011   Cerebrovascular disease 08/14/2016   Chronic back pain    "all over"   Chronic low back pain 01/11/2016   Complication of anesthesia    tends to have hypotension when NPO and post-anesthesia   Constipation    takes stool softener daily   Degenerative disk disease    Degenerative joint disease    DVT (deep venous thrombosis) (HCC) 2014   RLE   Family history of adverse reaction to anesthesia    a family member woke up during surgery; "think it was my mom"   Fibromyalgia    Generalized osteoarthritis of multiple sites 11/04/2013   GERD (gastroesophageal reflux disease)    Headache    Heart palpitations 12/14/2013   resolved   History of blood clots    superficial   Hypoglycemia    Hypothyroid    takes Synthroid daily   Incomplete emptying of bladder    Insomnia    takes Trazodone nightly   Iron deficiency anemia    takes Ferrous Sulfate daily   Joint pain    Joint swelling    knees and ankles   Memory disorder 08/14/2016   Morbid obesity (HCC)    Nausea    takes Zofran as needed.Seeing GI doc   Neck pain 05/25/2016   OSA on CPAP    tested more than 5 yrs ago.     Osteoarthritis    Osteopenia    in feet   PFO (patent foramen ovale)     no murmur   Pre-diabetes    Primary osteoarthritis of both feet 05/29/2016   Right bunionectomy August 2017 by Dr. Lajoyce Corners   PVC's (premature ventricular contractions) 10/11/2021   Monitor 9/23: 1.8% PVCs; rare PACs; o/w NSR   Scoliosis    Skin abnormality 02/04/2020   raised area on lip   Stroke [facility]) "several"last 2014   right foot weakness; memory issues, black spot right visual field since" (03/23/2015)   Thrombophlebitis    Trochanteric bursitis of both hips 05/25/2016   Unilateral primary osteoarthritis, right knee 05/29/2016   Urinary urgency 04/19/2016   Vein disorder 11/29/2010   varicose veins both legs   Wears glasses     Past Surgical History:  Procedure Laterality Date   BIOPSY  11/14/2018   Procedure: BIOPSY;  Surgeon: Malissa Hippo, MD;  [facility];  Service: Endoscopy;;  esophagusgastric   BONE EXCISION Right 08/29/2017   Procedure: right trapezium excision;  Surgeon: Cindee Salt, MD;  Location: [facility];  Service: Orthopedics;  Laterality: Right;   BUNIONECTOMY Right 08/2015   CARDIAC CATHETERIZATION     2008.  "it was fine" (not sure why she had it done, and doesn't know where)   CARPOMETACARPEL SUSPENSION PLASTY Right 08/29/2017   Procedure: SUSPENSION PLASTY RIGHT THUMB;  Surgeon: Cindee Salt, MD;  Location: [facility];  Service: Orthopedics;  Laterality: Right;   COLONOSCOPY N/A 03/25/2013   Procedure: COLONOSCOPY;  Surgeon: Malissa Hippo, MD;  [facility];  Service: Endoscopy;  Laterality: N/A;  930   ERCP  06/02/2020   ESOPHAGOGASTRODUODENOSCOPY     ESOPHAGOGASTRODUODENOSCOPY (EGD) WITH PROPOFOL N/A 11/14/2018   Procedure: ESOPHAGOGASTRODUODENOSCOPY (EGD) WITH PROPOFOL;  Surgeon: Malissa Hippo, MD;  [facility]: [facility];  Service: Endoscopy;  Laterality: N/A;  1:55pm-office moved to 11:00am/pt notified to arrive at 9:30am per KF   EXPLORATORY LAPAROTOMY     "took fallopian tubes out"   HALLUX  FUSION Right 02/12/2020   Procedure: HALLUX INTERPHANGEAL JOINT  FUSION;  Surgeon: Candelaria Stagers, DPM;  Location: [facility];  Service: Podiatry;  Laterality: Right;   HAMMER TOE SURGERY Right 02/12/2020   Procedure: SECOND AND THIRD HAMMER TOE CORRECTION; CAPSULOTOMY SECOND INTERPHALANGEAL JOINT;  Surgeon: Candelaria Stagers, DPM;  Location: [facility];  Service: Podiatry;  Laterality: Right;   JOINT REPLACEMENT     bil knee    KNEE ARTHROSCOPY Left    KNEE ARTHROSCOPY W/ ACL RECONSTRUCTION Right yrs ago   "added pins"   LAPAROSCOPIC CHOLECYSTECTOMY  ~ 2001   ROUX-EN-Y GASTRIC BYPASS  11/20/2010   [place]   SPINAL CORD STIMULATOR INSERTION N/A 04/18/2017   Procedure: LUMBAR SPINAL CORD STIMULATOR INSERTION;  Surgeon: Odette Fraction, MD;  Location: [facility];  Service: Neurosurgery;  Laterality: N/A;  LUMBAR SPINAL CORD STIMULATOR INSERTION   stomach stent  04/28/2020   TENDON TRANSFER Right 08/29/2017   Procedure: right abductor pollicis longus transfer;  Surgeon: Cindee Salt, MD;  Location: [facility];  Service: Orthopedics;  Laterality: Right;   TOTAL KNEE ARTHROPLASTY Left 03/23/2015   Procedure: TOTAL KNEE ARTHROPLASTY;  Surgeon: Nadara Mustard, MD;  Location: [facility];  Service: Orthopedics;  Laterality: Left;   TOTAL KNEE ARTHROPLASTY Right 08/15/2016   Procedure: RIGHT TOTAL KNEE ARTHROPLASTY, REMOVAL ACL SCREWS;  Surgeon: Nadara Mustard, MD;  Location: [facility];  Service: Orthopedics;  Laterality: Right;   TOTAL KNEE ARTHROPLASTY WITH HARDWARE REMOVAL Right    VAGINAL HYSTERECTOMY     tah/bso   VARICOSE VEIN SURGERY Right X 2   WEIL OSTEOTOMY Right 02/12/2020   Procedure: DOUBLE L OSTEOTOMY;  Surgeon: Candelaria Stagers, DPM;  Location: [facility];  Service: Podiatry;  Laterality: Right;    Current Outpatient Medications  Medication Sig Dispense Refill   baclofen (LIORESAL) 10 MG tablet Take 1 tablet (10 mg total) by mouth 3 (three) times  daily. 90 each 0   Blood Glucose Monitoring Suppl (FREESTYLE PRECISION NEO SYSTEM) w/Device KIT USE 1 STRIP TO CHECK GLUCOSE 4 TIMES DAILY AND AS NEEDED     celecoxib (CELEBREX) 200 MG capsule Take by mouth. Takes as needed     Cholecalciferol (VITAMIN D3) 125 MCG (5000 UT) TABS Take 5,000 Units by mouth daily.     Cyanocobalamin (VITAMIN B-12) 5000 MCG  SUBL Place 5,000 mcg under the tongue daily.      diclofenac sodium (VOLTAREN) 1 % GEL Apply 4 g topically 4 (four) times daily as needed (PAIN). 5 Tube 1   donepezil (ARICEPT) 10 MG tablet Take 1 tablet by mouth at bedtime. 90 tablet 2   DULoxetine (CYMBALTA) 60 MG capsule Take 1 capsule (60 mg total) by mouth 2 (two) times daily. 60 capsule 0   famotidine (PEPCID) 40 MG tablet Take 1 tablet (40 mg total) by mouth at bedtime. 30 tablet 0   ferrous gluconate (FERGON) 324 MG tablet Take 1 tablet (324 mg total) by mouth 2 (two) times daily with a meal. 60 tablet 0   folic acid (FOLVITE) 400 MCG tablet Take 400 mcg by mouth daily.     gabapentin (NEURONTIN) 800 MG tablet 800 mg. One tid     Galcanezumab-gnlm (EMGALITY) 120 MG/ML SOSY INJECT 120 MG SUBCUTANEOUSLY ONCE EVERY MONTH 1 mL 3   Krill Oil 350 MG CAPS Take 350 mg by mouth at bedtime.      levothyroxine (SYNTHROID) 125 MCG tablet Take 1 tablet (125 mcg total) by mouth daily before breakfast. 30 tablet 0   Methylfol-Methylcob-Acetylcyst (METAFOLBIC PLUS) 6-2-600 MG TABS Take 1 tablet by mouth daily. 30 tablet 3   methylphenidate (CONCERTA) 27 MG PO CR tablet Take 1 tablet (27 mg total) by mouth 2 (two) times daily. 10 am & 1400 60 tablet 0   metoCLOPramide (REGLAN) 5 MG tablet Take 1 tablet by mouth twice daily. 60 tablet 2   miconazole (ZEASORB-AF) 2 % powder Every shift as needed. Apply to red areas in folds of skin.     mirabegron ER (MYRBETRIQ) 50 MG TB24 tablet Take 1 tablet (50 mg total) by mouth daily. 30 tablet 0   Multiple Minerals-Vitamins (CAL-MAG-ZINC-D PO) Take 3 tablets by mouth  daily. 333 mg-133 unit -133 mg-5 mg     Multiple Vitamins-Minerals (ONE-A-DAY WOMENS PETITES PO) Take 1 tablet by mouth 2 (two) times daily.     naloxegol oxalate (MOVANTIK) 25 MG TABS tablet TAKE (1) TABLET BY MOUTH ONCE DAILY. 30 tablet 3   NARCAN 4 MG/0.1ML LIQD nasal spray kit Place 0.1 sprays (0.4 mg total) into the nose 2 (two) times daily as needed. 1 each 0   NUCYNTA ER 100 MG 12 hr tablet SMARTSIG:1 Tablet(s) By Mouth Every 12 Hours     omeprazole (PRILOSEC) 40 MG capsule Take 1 capsule by mouth twice daily. 60 capsule 2   ondansetron (ZOFRAN-ODT) 4 MG disintegrating tablet Take 1 tablet (4 mg total) by mouth every 8 (eight) hours as needed for nausea or vomiting. 20 tablet 0   ONETOUCH ULTRA test strip      OneTouch UltraSoft 2 Lancets MISC      Oxycodone HCl 10 MG TABS Take 10 mg by mouth every 6 (six) hours as needed.     Rimegepant Sulfate (NURTEC) 75 MG TBDP Take 75 mg by mouth daily as needed. 8 tablet 0   rivaroxaban (XARELTO) 20 MG TABS tablet Take 1 tablet (20 mg total) by mouth daily at 6 PM. 30 tablet 0   tiZANidine (ZANAFLEX) 4 MG tablet Take 1 tablet (4 mg total) by mouth every 6 (six) hours as needed for muscle spasms. 30 tablet 0   topiramate (TOPAMAX) 100 MG tablet Take 1 tablet by mouth twice daily. 180 tablet 1   traZODone (DESYREL) 100 MG tablet Take 0.5 tablets (50 mg total) by mouth at bedtime. 15 tablet  0   triamcinolone cream (KENALOG) 0.1 % SMARTSIG:1 Application Topical 2-3 Times Daily     trospium (SANCTURA) 20 MG tablet Take 1 tablet (20 mg total) by mouth 2 (two) times daily. 60 tablet 0   Ubrogepant (UBRELVY) 100 MG TABS Take 100 mg by mouth as needed. 2 tablet 0   vitamin C (ASCORBIC ACID) 500 MG tablet Take 500 mg by mouth daily.     No current facility-administered medications for this visit.    Allergies as of 05/15/2022 - Review Complete 05/15/2022  Allergen Reaction Noted   Lyrica [pregabalin] Shortness Of Breath and Swelling 12/01/2015   Belsomra  [suvorexant] Other (See Comments) 12/01/2015   Morphine and related Itching 10/26/2010   Sulfamethoxazole-trimethoprim Itching and Rash 03/10/2015   Tape Itching and Rash 03/10/2015    Family History  Problem Relation Age of Onset   Cancer Mother        skin and breast   Myasthenia gravis Mother    Breast cancer Mother    Heart disease Father    Cancer Father    Parkinson's disease Father    Heart disease Sister    Heart attack Sister    Heart disease Brother    Cancer Brother    Diabetes Brother    Stroke Brother    Cancer Maternal Grandfather    Hypothyroidism Daughter    Hypertension Other    Colon cancer Neg Hx     Social History   Socioeconomic History   Marital status: Married    Spouse name: Fayrene Fearing   Number of children: 1   Years of education: 14   Highest education level: Not on file  Occupational History   Occupation: Disability   Occupation: formerly Charity fundraiser, [employer]  Tobacco Use   Smoking status: Former    Packs/day: 0.75    Years: 8.00    Additional pack years: 0.00    Total pack years: 6.00    Types: Cigarettes    Quit date: 12/01/1990    Years since quitting: 31.4    Passive exposure: Past   Smokeless tobacco: Never  Vaping Use   Vaping Use: Never used  Substance and Sexual Activity   Alcohol use: No    Comment: 03/23/2015 "stopped drinking in 2012 w/gastric bypass; drank socially before bypass"   Drug use: No   Sexual activity: Not Currently    Birth control/protection: Surgical  Other Topics Concern   Not on file  Social History Narrative   Lives with husband   Caffeine use: No soda   Mainly water, drinks decaf tea   Right handed   Social Determinants of Health   Financial Resource Strain: Not on file  Food Insecurity: Not on file  Transportation Needs: Not on file  Physical Activity: Not on file  Stress: Not on file  Social Connections: Not on file    Review of systems General: negative for malaise, night sweats, fever, chills, weight  loss Neck: Negative for lumps, goiter, pain and significant neck swelling Resp: Negative for cough, wheezing, dyspnea at rest CV: Negative for chest pain, leg swelling, palpitations, orthopnea GI: denies hematochezia, vomiting, diarrhea, constipation, dysphagia, odyonophagia, early satiety or unintentional weight loss. +melena +LUQ pain +nausea  MSK: Negative for joint pain or swelling, back pain, and muscle pain. Derm: Negative for itching or rash Psych: Denies depression, anxiety, memory loss, confusion. No homicidal or suicidal ideation.  Heme: Negative for prolonged bleeding, bruising easily, and swollen nodes. Endocrine: Negative for cold or heat intolerance,  polyuria, polydipsia and goiter. Neuro: negative for tremor, gait imbalance, syncope and seizures. The remainder of the review of systems is noncontributory.  Physical Exam: There were no vitals taken for this visit. General:   Alert and oriented. No distress noted. Pleasant and cooperative.  Head:  Normocephalic and atraumatic. Eyes:  Conjuctiva clear without scleral icterus. Mouth:  Oral mucosa pink and moist. Good dentition. No lesions. Heart: Normal rate and rhythm, s1 and s2 heart sounds present.  Lungs: Clear lung sounds in all lobes. Respirations equal and unlabored. Abdomen:  +BS, soft, and non-distended. TTP of LUQ. No rebound or guarding. No HSM or masses noted. Derm: No palmar erythema or jaundice Msk:  Symmetrical without gross deformities. Normal posture. Extremities:  Without edema. Neurologic:  Alert and  oriented x4 Psych:  Alert and cooperative. Normal mood and affect.  Invalid input(s): "6 MONTHS"   ASSESSMENT: Debbie Pineda is a 62 y.o. female presenting today for melena and LUQ pain  Melena/LUQ Pain: noting symptoms for the past few months in setting of celebrex use. No BRBPR. Denies weight loss, vomiting. She has chronic nausea but this appears well controlled. Eating does not worsen or alleviate  her pain. She was started on carafate 1g QID by PCP and hgb last week was 14. She was advised then to stop celebrex and I discussed avoiding all NSAIDs at this time. Ideally she needs an EGD for further evaluation as I cannot rule out PUD, gastritis, duodenitis, however, she was to have follow up EGD at [facility] in May for history of her gastrogastric fistula but was lost to follow up for this. I will discuss with Dr. Levon Hedger performing EGD for evaluation of melena here vs. At [facility]. For now she should continue with PPI, H2B and Carafate.   PLAN:  Continue with PPI and H2B  2.  Continue carafate 1g QID 3. Discuss EGD here vs. [facility] with Dr. Levon Hedger  4. Stop NSAIDs, discuss other pain therapies with pain management  5. Will need [facility] follow up for gastrogastric fistula   All questions were answered, patient verbalized understanding and is in agreement with plan as outlined above.   Follow Up: Has f/u in June   Beula Joyner L. Jeanmarie Hubert, MSN, APRN, AGNP-C Adult-Gerontology Nurse Practitioner [facility]  **addendum, spoke with Dr. Levon Hedger regarding EGD, recommend proceeding with EGD here for evaluation of melena, patient will still need to follow up at [facility] for repeat EGD for gastrogastric fistula thereafter, I reached out to the patient via telephone to discuss recommendations, Indications, risks and benefits of procedure discussed in detail with patient. Patient verbalized understanding and is in agreement to proceed with EGD.    I have reviewed the note and agree with the APP's assessment as described in this progress note  Katrinka Blazing, MD Gastroenterology and Hepatology [facility]

## 2022-05-16 ENCOUNTER — Telehealth (INDEPENDENT_AMBULATORY_CARE_PROVIDER_SITE_OTHER): Payer: Self-pay | Admitting: Gastroenterology

## 2022-05-16 NOTE — Telephone Encounter (Signed)
can we get patient scheduled for EGD, ASA III (diagnosis is melena and LUQ pain)   CC also on xarelto which needs to be held 3 days, thanks!

## 2022-05-17 ENCOUNTER — Encounter: Payer: PPO | Attending: Family Medicine | Admitting: Nutrition

## 2022-05-17 NOTE — Progress Notes (Signed)
Medical Nutrition Therapy  Appointment Start time:  39  Appointment End time:  1145  Primary concerns today: Obesity  Referral diagnosis: E66.01 Preferred learning style: No Preference  Learning readiness: Contemplating    NUTRITION ASSESSMENT  62 yr old female referred for obesity. She is caring for her aging mother.  She is willing to try to eat more whole plant based foods and work on meal prepping to make better food choices.  Anthropometrics   05/17/2022  Weight /BMI   Weight 277 lb   Height 5\' 5"  (1.651 m)   BMI 46.1 kg/m2      Clinical Medical Hx:  Past Medical History:  Diagnosis Date   ADD (attention deficit disorder)    Arthritis    "all over"   Cerebral infarction (HCC) 10/30/2011   Cerebrovascular disease 08/14/2016   Chronic back pain    "all over"   Chronic low back pain 01/11/2016   Complication of anesthesia    tends to have hypotension when NPO and post-anesthesia   Constipation    takes stool softener daily   Degenerative disk disease    Degenerative joint disease    DVT (deep venous thrombosis) (HCC) 2014   RLE   Family history of adverse reaction to anesthesia    a family member woke up during surgery; "think it was my mom"   Fibromyalgia    Generalized osteoarthritis of multiple sites 11/04/2013   GERD (gastroesophageal reflux disease)    Headache    Heart palpitations 12/14/2013   resolved   History of blood clots    superficial   Hypoglycemia    Hypothyroid    takes Synthroid daily   Incomplete emptying of bladder    Insomnia    takes Trazodone nightly   Iron deficiency anemia    takes Ferrous Sulfate daily   Joint pain    Joint swelling    knees and ankles   Memory disorder 08/14/2016   Morbid obesity (HCC)    Nausea    takes Zofran as needed.Seeing GI doc   Neck pain 05/25/2016   OSA on CPAP    tested more than 5 yrs ago.     Osteoarthritis    Osteopenia    in feet   PFO (patent foramen ovale)    no murmur   PFO  (patent foramen ovale)    Pre-diabetes    Primary osteoarthritis of both feet 05/29/2016   Right bunionectomy August 2017 by Dr. Lajoyce Corners   PVC's (premature ventricular contractions) 10/11/2021   Monitor 9/23: 1.8% PVCs; rare PACs; o/w NSR   Scoliosis    Skin abnormality 02/04/2020   raised area on lip   Stroke [facility]) "several"last 2014   right foot weakness; memory issues, black spot right visual field since" (03/23/2015)   Thrombophlebitis    Trochanteric bursitis of both hips 05/25/2016   Unilateral primary osteoarthritis, right knee 05/29/2016   Urinary urgency 04/19/2016   Vein disorder 11/29/2010   varicose veins both legs   Wears glasses      Medications:  Current Outpatient Medications on File Prior to Visit  Medication Sig Dispense Refill   baclofen (LIORESAL) 10 MG tablet Take 1 tablet (10 mg total) by mouth 3 (three) times daily. 90 each 0   Blood Glucose Monitoring Suppl (FREESTYLE PRECISION NEO SYSTEM) w/Device KIT USE 1 STRIP TO CHECK GLUCOSE 4 TIMES DAILY AND AS NEEDED     diclofenac sodium (VOLTAREN) 1 % GEL Apply 4 g topically 4 (four) times  daily as needed (PAIN). 5 Tube 1   donepezil (ARICEPT) 10 MG tablet Take 1 tablet by mouth at bedtime. 90 tablet 2   ferrous gluconate (FERGON) 324 MG tablet Take 1 tablet (324 mg total) by mouth 2 (two) times daily with a meal. 60 tablet 0   folic acid (FOLVITE) 400 MCG tablet Take 400 mcg by mouth daily.     gabapentin (NEURONTIN) 800 MG tablet 800 mg. One tid     Krill Oil 350 MG CAPS Take 350 mg by mouth at bedtime.      levothyroxine (SYNTHROID) 125 MCG tablet Take 1 tablet (125 mcg total) by mouth daily before breakfast. 30 tablet 0   methylphenidate (CONCERTA) 27 MG PO CR tablet Take 1 tablet (27 mg total) by mouth 2 (two) times daily. 10 am & 1400 60 tablet 0   miconazole (ZEASORB-AF) 2 % powder Every shift as needed. Apply to red areas in folds of skin.     mirabegron ER (MYRBETRIQ) 50 MG TB24 tablet Take 1 tablet (50 mg  total) by mouth daily. 30 tablet 0   Multiple Minerals-Vitamins (CAL-MAG-ZINC-D PO) Take 3 tablets by mouth daily. 333 mg-133 unit -133 mg-5 mg     naloxegol oxalate (MOVANTIK) 25 MG TABS tablet TAKE (1) TABLET BY MOUTH ONCE DAILY. 30 tablet 3   NARCAN 4 MG/0.1ML LIQD nasal spray kit Place 0.1 sprays (0.4 mg total) into the nose 2 (two) times daily as needed. 1 each 0   NUCYNTA ER 100 MG 12 hr tablet SMARTSIG:1 Tablet(s) By Mouth Every 12 Hours     ondansetron (ZOFRAN-ODT) 4 MG disintegrating tablet Take 1 tablet (4 mg total) by mouth every 8 (eight) hours as needed for nausea or vomiting. 20 tablet 0   ONETOUCH ULTRA test strip      OneTouch UltraSoft 2 Lancets MISC      Oxycodone HCl 10 MG TABS Take 10 mg by mouth every 6 (six) hours as needed.     rivaroxaban (XARELTO) 20 MG TABS tablet Take 1 tablet (20 mg total) by mouth daily at 6 PM. 30 tablet 0   tiZANidine (ZANAFLEX) 4 MG tablet Take 1 tablet (4 mg total) by mouth every 6 (six) hours as needed for muscle spasms. 30 tablet 0   traZODone (DESYREL) 100 MG tablet Take 0.5 tablets (50 mg total) by mouth at bedtime. 15 tablet 0   triamcinolone cream (KENALOG) 0.1 % SMARTSIG:1 Application Topical 2-3 Times Daily     trospium (SANCTURA) 20 MG tablet Take 1 tablet (20 mg total) by mouth 2 (two) times daily. 60 tablet 0   vitamin C (ASCORBIC ACID) 500 MG tablet Take 500 mg by mouth daily.     No current facility-administered medications on file prior to visit.    Labs:  Lab Results  Component Value Date   HGBA1C 5.0 05/02/2017     Notable Signs/Symptoms: Tired  Lifestyle & Dietary Hx Married and helps her mom  Estimated daily fluid intake: 60-75 oz Supplements: See chart Sleep: 5-11 hrs Stress / self-care: stress caring for her mom and being away from husband Current average weekly physical activity: Working in the yard  24-Hr Dietary Recall First Meal: 10 am  pb and banana sandwich on whole wheat, water Tends to eat when her  mom wants to eat.  Estimated Energy Needs Calories: 1200 Carbohydrate: 135g Protein: 90g Fat: 33g   NUTRITION DIAGNOSIS  NI-1.7 Predicted excessive energy intake As related to high calorie diet and insuffient  execise.  As evidenced by BMI 46.   NUTRITION INTERVENTION  Nutrition education (E-1) on the following topics:  Lifestyle Medicine  - Whole Food, Plant Predominant Nutrition is highly recommended: Eat Plenty of vegetables, Mushrooms, fruits, Legumes, Whole Grains, Nuts, seeds in lieu of processed meats, processed snacks/pastries red meat, poultry, eggs.    -It is better to avoid simple carbohydrates including: Cakes, Sweet Desserts, Ice Cream, Soda (diet and regular), Sweet Tea, Candies, Chips, Cookies, Store Bought Juices, Alcohol in Excess of  1-2 drinks a day, Lemonade,  Artificial Sweeteners, Doughnuts, Coffee Creamers, "Sugar-free" Products, etc, etc.  This is not a complete list.....  Exercise: If you are able: 30 -60 minutes a day ,4 days a week, or 150 minutes a week.  The longer the better.  Combine stretch, strength, and aerobic activities.  If you were told in the past that you have high risk for cardiovascular diseases, you may seek evaluation by your heart doctor prior to initiating moderate to intense exercise programs. Emotional eating  Handouts Provided Include  Lifesyle medicine handouts  Learning Style & Readiness for Change Teaching method utilized: Visual & Auditory  Demonstrated degree of understanding via: Teach Back  Barriers to learning/adherence to lifestyle change: non3  Goals Established by Pt Goals Focus on whole plant based meals Try to eat dinner before 7 pm.   MONITORING & EVALUATION Dietary intake, weekly physical activity, and weight in 1 month.  Next Steps  Patient is to work on eating more whole plant based foods and meal prepping.Marland Kitchen

## 2022-05-17 NOTE — Telephone Encounter (Signed)
Pt contacted and scheduled for EGD ASA 3 06/07/22 at 2 pm. Pt would like instructions mailed to her at her mothers home ([address] [place]). Will call pt with pre op appt

## 2022-05-17 NOTE — Patient Instructions (Signed)
Goals Focus on whole plant based meals Try to eat dinner before 7 pm.

## 2022-05-22 DIAGNOSIS — M79671 Pain in right foot: Secondary | ICD-10-CM | POA: Diagnosis not present

## 2022-05-22 DIAGNOSIS — G894 Chronic pain syndrome: Secondary | ICD-10-CM | POA: Diagnosis not present

## 2022-05-22 DIAGNOSIS — M47816 Spondylosis without myelopathy or radiculopathy, lumbar region: Secondary | ICD-10-CM | POA: Diagnosis not present

## 2022-05-22 DIAGNOSIS — M5416 Radiculopathy, lumbar region: Secondary | ICD-10-CM | POA: Diagnosis not present

## 2022-05-23 NOTE — Telephone Encounter (Signed)
Pt contacted and made aware of pre op appt.  

## 2022-06-04 ENCOUNTER — Other Ambulatory Visit (INDEPENDENT_AMBULATORY_CARE_PROVIDER_SITE_OTHER): Payer: Self-pay | Admitting: *Deleted

## 2022-06-04 MED ORDER — OMEPRAZOLE 40 MG PO CPDR: 40.0000 mg | DELAYED_RELEASE_CAPSULE | Freq: Two times a day (BID) | ORAL | 2 refills | Status: DC

## 2022-06-05 NOTE — Patient Instructions (Signed)
Debbie Pineda  06/05/2022     @[email or web address] @   Your procedure is scheduled on  06/07/2022.   Report to [facility] at  1230  P.M.   Call this number if you have problems the morning of surgery:  [phone]  If you experience any cold or flu symptoms such as cough, fever, chills, shortness of breath, etc. between now and your scheduled surgery, please notify us at the above number.   Remember:  Follow the diet instructions given to you by the office.     Your last dose of iron should have been on 05/30/2022 and your last dose of xarelto should have been on 06/03/2022.     Take these medicines the morning of surgery with A SIP OF WATER            baclofen, duloxetine, gabapentin, levothyroxine, reglan,nucynta, omeprazole, zofran (if needed), oxycodone (if needed), nurtec(if needed), zanaflex(if needed), topamax, ubrelvy.     Do not wear jewelry, make-up or nail polish.  Do not wear lotions, powders, or perfumes, or deodorant.  Do not shave 48 hours prior to surgery.  Men may shave face and neck.  Do not bring valuables to the hospital.  [facility] is not responsible for any belongings or valuables.  Contacts, dentures or bridgework may not be worn into surgery.  Leave your suitcase in the car.  After surgery it may be brought to your room.  For patients admitted to the hospital, discharge time will be determined by your treatment team.  Patients discharged the day of surgery will not be allowed to drive home and must have someone with them for 24 hours.    Special instructions:   DO NOT smoke tobacco or vape for 24 hours before your procedure.  Please read over the following fact sheets that you were given. Anesthesia Post-op Instructions and Care and Recovery After Surgery      Upper Endoscopy, Adult, Care After After the procedure, it is common to have a sore throat. It is also common to have: Mild stomach pain or  discomfort. Bloating. Nausea. Follow these instructions at home: The instructions below may help you care for yourself at home. Your health care provider may give you more instructions. If you have questions, ask your health care provider. If you were given a sedative during the procedure, it can affect you for several hours. Do not drive or operate machinery until your health care provider says that it is safe. If you will be going home right after the procedure, plan to have a responsible adult: Take you home from the hospital or clinic. You will not be allowed to drive. Care for you for the time you are told. Follow instructions from your health care provider about what you may eat and drink. Return to your normal activities as told by your health care provider. Ask your health care provider what activities are safe for you. Take over-the-counter and prescription medicines only as told by your health care provider. Contact a health care provider if you: Have a sore throat that lasts longer than one day. Have trouble swallowing. Have a fever. Get help right away if you: Vomit blood or your vomit looks like coffee grounds. Have bloody, black, or tarry stools. Have a very bad sore throat or you cannot swallow. Have difficulty breathing or very bad pain in your chest or abdomen. These symptoms may be an emergency. Get help right away. Call 911. Do  not wait to see if the symptoms will go away. Do not drive yourself to the hospital. Summary After the procedure, it is common to have a sore throat, mild stomach discomfort, bloating, and nausea. If you were given a sedative during the procedure, it can affect you for several hours. Do not drive until your health care provider says that it is safe. Follow instructions from your health care provider about what you may eat and drink. Return to your normal activities as told by your health care provider. This information is not intended to replace  advice given to you by your health care provider. Make sure you discuss any questions you have with your health care provider. Document Revised: 04/19/2021 Document Reviewed: 04/19/2021 Elsevier Patient Education  2023 Else[employer] Monitored Anesthesia Care, Care After The following information offers guidance on how to care for yourself after your procedure. Your health care provider may also give you more specific instructions. If you have problems or questions, contact your health care provider. What can I expect after the procedure? After the procedure, it is common to have: Tiredness. Little or no memory about what happened during or after the procedure. Impaired judgment when it comes to making decisions. Nausea or vomiting. Some trouble with balance. Follow these instructions at home: For the time period you were told by your health care provider:  Rest. Do not participate in activities where you could fall or become injured. Do not drive or use machinery. Do not drink alcohol. Do not take sleeping pills or medicines that cause drowsiness. Do not make important decisions or sign legal documents. Do not take care of children on your own. Medicines Take over-the-counter and prescription medicines only as told by your health care provider. If you were prescribed antibiotics, take them as told by your health care provider. Do not stop using the antibiotic even if you start to feel better. Eating and drinking Follow instructions from your health care provider about what you may eat and drink. Drink enough fluid to keep your urine pale yellow. If you vomit: Drink clear fluids slowly and in small amounts as you are able. Clear fluids include water, ice chips, low-calorie sports drinks, and fruit juice that has water added to it (diluted fruit juice). Eat light and bland foods in small amounts as you are able. These foods include bananas, applesauce, rice, lean meats, toast, and  crackers. General instructions  Have a responsible adult stay with you for the time you are told. It is important to have someone help care for you until you are awake and alert. If you have sleep apnea, surgery and some medicines can increase your risk for breathing problems. Follow instructions from your health care provider about wearing your sleep device: When you are sleeping. This includes during daytime naps. While taking prescription pain medicines, sleeping medicines, or medicines that make you drowsy. Do not use any products that contain nicotine or tobacco. These products include cigarettes, chewing tobacco, and vaping devices, such as e-cigarettes. If you need help quitting, ask your health care provider. Contact a health care provider if: You feel nauseous or vomit every time you eat or drink. You feel light-headed. You are still sleepy or having trouble with balance after 24 hours. You get a rash. You have a fever. You have redness or swelling around the IV site. Get help right away if: You have trouble breathing. You have new confusion after you get home. These symptoms may be an emergency. Get  help right away. Call 911. Do not wait to see if the symptoms will go away. Do not drive yourself to the hospital. This information is not intended to replace advice given to you by your health care provider. Make sure you discuss any questions you have with your health care provider. Document Revised: 06/05/2021 Document Reviewed: 06/05/2021 Elsevier Patient Education  [place].

## 2022-06-06 ENCOUNTER — Encounter (HOSPITAL_COMMUNITY): Payer: Self-pay

## 2022-06-06 ENCOUNTER — Encounter (HOSPITAL_COMMUNITY)
Admission: RE | Admit: 2022-06-06 | Discharge: 2022-06-06 | Disposition: A | Discharge status: Home / Self Care | Payer: PPO | Source: Ambulatory Visit | Attending: Gastroenterology | Admitting: Gastroenterology

## 2022-06-06 ENCOUNTER — Ambulatory Visit: Payer: PPO | Admitting: Orthopedic Surgery

## 2022-06-06 ENCOUNTER — Ambulatory Visit (INDEPENDENT_AMBULATORY_CARE_PROVIDER_SITE_OTHER): Payer: PPO

## 2022-06-06 VITALS — BP 129/76 | HR 94 | Temp 97.6°F | Resp 18 | Ht 65.0 in | Wt 277.0 lb

## 2022-06-06 DIAGNOSIS — M533 Sacrococcygeal disorders, not elsewhere classified: Secondary | ICD-10-CM

## 2022-06-06 DIAGNOSIS — G8929 Other chronic pain: Secondary | ICD-10-CM

## 2022-06-06 DIAGNOSIS — Z981 Arthrodesis status: Secondary | ICD-10-CM | POA: Diagnosis not present

## 2022-06-06 DIAGNOSIS — Z8639 Personal history of other endocrine, nutritional and metabolic disease: Secondary | ICD-10-CM | POA: Insufficient documentation

## 2022-06-06 DIAGNOSIS — Z01812 Encounter for preprocedural laboratory examination: Secondary | ICD-10-CM | POA: Diagnosis not present

## 2022-06-06 DIAGNOSIS — D509 Iron deficiency anemia, unspecified: Secondary | ICD-10-CM | POA: Insufficient documentation

## 2022-06-06 LAB — BASIC METABOLIC PANEL
Anion gap: 8 (ref 5–15)
BUN: 11 mg/dL (ref 6–20)
CO2: 22 mmol/L (ref 22–32)
Calcium: 8.8 mg/dL — ABNORMAL LOW (ref 8.9–10.3)
Chloride: 108 mmol/L (ref 98–111)
Creatinine, Ser: 0.98 mg/dL (ref 0.44–1.00)
GFR, Estimated: >60 mL/min (ref >60)
Glucose, Bld: 73 mg/dL (ref 70–99)
Potassium: 4.1 mmol/L (ref 3.5–5.1)
Sodium: 138 mmol/L (ref 135–145)

## 2022-06-06 LAB — CBC WITH DIFFERENTIAL/PLATELET
Abs Immature Granulocytes: 0.01 10*3/uL (ref 0.00–0.07)
Basophils Absolute: 0 10*3/uL (ref 0.0–0.1)
Basophils Relative: 1 %
Eosinophils Absolute: 0.1 10*3/uL (ref 0.0–0.5)
Eosinophils Relative: 2 %
HCT: 39.4 % (ref 36.0–46.0)
Hemoglobin: 12.9 g/dL (ref 12.0–15.0)
Immature Granulocytes: 0 %
Lymphocytes Relative: 33 %
Lymphs Abs: 2.3 10*3/uL (ref 0.7–4.0)
MCH: 32.2 pg (ref 26.0–34.0)
MCHC: 32.7 g/dL (ref 30.0–36.0)
MCV: 98.3 fL (ref 80.0–100.0)
Monocytes Absolute: 0.5 10*3/uL (ref 0.1–1.0)
Monocytes Relative: 7 %
Neutro Abs: 4.1 10*3/uL (ref 1.7–7.7)
Neutrophils Relative %: 57 %
Platelets: 231 10*3/uL (ref 150–400)
RBC: 4.01 MIL/uL (ref 3.87–5.11)
RDW: 12.2 % (ref 11.5–15.5)
WBC: 7 10*3/uL (ref 4.0–10.5)
nRBC: 0 % (ref 0.0–0.2)

## 2022-06-06 NOTE — Progress Notes (Signed)
Orthopedic Spine Surgery Office Note   Assessment: Patient is a 62 y.o. female who underwent T10-S1 PSIF and multi-level TLIF with my partner on 07/2021 who feels she got symptomatic relief with surgery. She has since developed right SI joint pain that has responded to injections     Plan: -Patient has tried activity modification, pain management, right SI joint injections -Explained that she would be a candidate for SI fusion if she loses weight since she has had 2 injections with greater than 70% relief and physical exam findings consistent with SI pain.  She is not interested in further surgery at this time and wants to continue with injections -Recommended repeat SI joint injection with Dr. Shon Baton and weight loss -Patient should return to office in 12 weeks, x-rays at next visit: AP/lateral/flex/ex lumbar     Patient expressed understanding of the plan and all questions were answered to the patient's satisfaction.    ___________________________________________________________________________   History: Patient is a 62 y.o. female who has been previously seen in the office for routine post-operative follow up after my partner did a T10-S1 PSIF with multi-level TLIFs.  She feels her back pain has improved since surgery.  She has developed new right buttock pain since surgery.  She feels it is worse with weightbearing.  She has gotten two right SI joint injections that did provide her with significant relief.  She estimates that she had greater than 70% relief but only lasted about a week.  Then, pain returns and is the same as it was prior to injection.  She is not having pain radiating down either lower extremity.   Previous treatments: pain management, activity modification, right SI injections    Physical Exam:   General: no acute distress, appears stated age Neurologic: alert, answering questions appropriately, following commands Respiratory: unlabored breathing on room air, symmetric  chest rise Psychiatric: appropriate affect, normal cadence to speech    MSK (spine):   -Strength exam                                                   Left                  Right EHL                              5/5                  -/5 TA                                 5/5                  5/5 GSC                             5/5                  5/5 Knee extension            5/5                  5/5 Hip flexion  5/5                  5/5   Had MTP fusion on the right    -Sensory exam                           Sensation intact to light touch in L3-S1 nerve distributions of bilateral lower extremities   -Straight leg raise: negative -Contralateral straight leg raise: negative -Clonus: no beats bilaterally   -Right hip exam: positive FADIR, positive SI joint compression test, TTP over the SI joint, positive FABER, positive Gaenslen's -Left hip exam: positive FADIR, no pain with SI joint compression test, no tenderness to palpation, negative FABER   Imaging: XR of the lumbar spine from 06/06/2022 was independently reviewed and interpreted, showing subsidence of the L4/5 and L5/S1 TLIF cages. No lucency around the screws. Screws have not backed out.  The cranial most screw violates the endplate and enters the adjacent disc base.  No fractures seen.  No evidence of instability on flexion/extension views.  No broken instrumentation seen.     Patient name: Debbie Pineda Patient MRN: [id number] Date of visit: 06/06/22

## 2022-06-07 ENCOUNTER — Ambulatory Visit (HOSPITAL_COMMUNITY)
Admission: RE | Admit: 2022-06-07 | Discharge: 2022-06-07 | Disposition: A | Discharge status: Home / Self Care | Payer: PPO | Attending: Gastroenterology | Admitting: Gastroenterology

## 2022-06-07 ENCOUNTER — Ambulatory Visit (HOSPITAL_COMMUNITY): Payer: PPO | Admitting: Anesthesiology

## 2022-06-07 ENCOUNTER — Encounter (HOSPITAL_COMMUNITY): Payer: Self-pay | Admitting: Gastroenterology

## 2022-06-07 ENCOUNTER — Ambulatory Visit (HOSPITAL_BASED_OUTPATIENT_CLINIC_OR_DEPARTMENT_OTHER): Payer: PPO | Admitting: Anesthesiology

## 2022-06-07 ENCOUNTER — Encounter (HOSPITAL_COMMUNITY)
Admission: RE | Disposition: A | Discharge status: Home / Self Care | Payer: Self-pay | Source: Home / Self Care | Attending: Gastroenterology

## 2022-06-07 DIAGNOSIS — K2971 Gastritis, unspecified, with bleeding: Secondary | ICD-10-CM

## 2022-06-07 DIAGNOSIS — K316 Fistula of stomach and duodenum: Secondary | ICD-10-CM | POA: Diagnosis not present

## 2022-06-07 DIAGNOSIS — G4733 Obstructive sleep apnea (adult) (pediatric): Secondary | ICD-10-CM | POA: Diagnosis not present

## 2022-06-07 DIAGNOSIS — Z980 Intestinal bypass and anastomosis status: Secondary | ICD-10-CM | POA: Insufficient documentation

## 2022-06-07 DIAGNOSIS — Z9989 Dependence on other enabling machines and devices: Secondary | ICD-10-CM | POA: Diagnosis not present

## 2022-06-07 DIAGNOSIS — K2950 Unspecified chronic gastritis without bleeding: Secondary | ICD-10-CM | POA: Insufficient documentation

## 2022-06-07 DIAGNOSIS — K2970 Gastritis, unspecified, without bleeding: Secondary | ICD-10-CM

## 2022-06-07 DIAGNOSIS — Z9884 Bariatric surgery status: Secondary | ICD-10-CM | POA: Diagnosis not present

## 2022-06-07 DIAGNOSIS — K921 Melena: Secondary | ICD-10-CM | POA: Diagnosis not present

## 2022-06-07 DIAGNOSIS — R1010 Upper abdominal pain, unspecified: Secondary | ICD-10-CM | POA: Diagnosis not present

## 2022-06-07 DIAGNOSIS — Z87891 Personal history of nicotine dependence: Secondary | ICD-10-CM | POA: Insufficient documentation

## 2022-06-07 DIAGNOSIS — R1013 Epigastric pain: Secondary | ICD-10-CM

## 2022-06-07 DIAGNOSIS — G4730 Sleep apnea, unspecified: Secondary | ICD-10-CM | POA: Diagnosis not present

## 2022-06-07 HISTORY — PX: ESOPHAGOGASTRODUODENOSCOPY (EGD) WITH PROPOFOL: SHX5813

## 2022-06-07 HISTORY — PX: BIOPSY: SHX5522

## 2022-06-07 LAB — GLUCOSE, CAPILLARY: Glucose-Capillary: 133 mg/dL — ABNORMAL HIGH (ref 70–99)

## 2022-06-07 SURGERY — ESOPHAGOGASTRODUODENOSCOPY (EGD) WITH PROPOFOL
Anesthesia: General

## 2022-06-07 MED ORDER — LIDOCAINE 2% (20 MG/ML) 5 ML SYRINGE
INTRAMUSCULAR | Status: DC | PRN
  Administered 2022-06-07: 50 mg via INTRAVENOUS

## 2022-06-07 MED ORDER — PROPOFOL 500 MG/50ML IV EMUL
INTRAVENOUS | Status: DC | PRN
  Administered 2022-06-07: 175 ug/kg/min via INTRAVENOUS

## 2022-06-07 MED ORDER — PROPOFOL 10 MG/ML IV BOLUS
INTRAVENOUS | Status: DC | PRN
  Administered 2022-06-07: 70 mg via INTRAVENOUS

## 2022-06-07 MED ORDER — LACTATED RINGERS IV SOLN: INTRAVENOUS | Status: DC

## 2022-06-07 NOTE — Transfer of Care (Signed)
Immediate Anesthesia Transfer of Care Note  Patient: Debbie Pineda  Procedure(s) Performed: ESOPHAGOGASTRODUODENOSCOPY (EGD) WITH PROPOFOL BIOPSY  Patient Location: PACU  Anesthesia Type:General  Level of Consciousness: sedated  Airway & Oxygen Therapy: Patient Spontanous Breathing and Patient connected to nasal cannula oxygen  Post-op Assessment: Report given to RN and Post -op Vital signs reviewed and stable  Post vital signs: Reviewed and stable  Last Vitals:  Vitals Value Taken Time  BP    Temp    Pulse    Resp    SpO2      Last Pain:  Vitals:   06/07/22 1154  TempSrc: Oral  PainSc: 4       Patients Stated Pain Goal: 5 (06/07/22 1154)  Complications: No notable events documented.

## 2022-06-07 NOTE — Anesthesia Postprocedure Evaluation (Signed)
Anesthesia Post Note  Patient: KIKUKO TRESNER  Procedure(s) Performed: ESOPHAGOGASTRODUODENOSCOPY (EGD) WITH PROPOFOL BIOPSY  Patient location during evaluation: Phase II Anesthesia Type: General Level of consciousness: awake and alert and oriented Pain management: pain level controlled Vital Signs Assessment: post-procedure vital signs reviewed and stable Respiratory status: spontaneous breathing, nonlabored ventilation and respiratory function stable Cardiovascular status: blood pressure returned to baseline and stable Postop Assessment: no apparent nausea or vomiting Anesthetic complications: no  No notable events documented.   Last Vitals:  Vitals:   06/07/22 1154 06/07/22 1327  BP: (!) 107/59 (!) 102/58  Pulse: 67 (!) 56  Resp: 18 15  Temp: 36.6 C (!) 36.3 C  SpO2: 99% 98%    Last Pain:  Vitals:   06/07/22 1330  TempSrc:   PainSc: 0-No pain                 Xariah Silvernail C Michaelpaul Apo

## 2022-06-07 NOTE — Op Note (Signed)
[facility] Patient Name: Debbie Pineda Procedure Date: 06/07/2022 1:00 PM MRN: [id number] Date of Birth: [birth year] Attending MD: Katrinka Blazing , , [id number] CSN: [id number] Age: 62 Admit Type: Outpatient Procedure:                Upper GI endoscopy Indications:              Upper abdominal pain, Melena Providers:                Katrinka Blazing, Jannett Celestine, RN, Lennice Sites                            Technician, Technician Referring MD:              Medicines:                Monitored Anesthesia Care Complications:            No immediate complications. Estimated Blood Loss:     Estimated blood loss: none. Procedure:                Pre-Anesthesia Assessment:                           - Prior to the procedure, a History and Physical                            was performed, and patient medications, allergies                            and sensitivities were reviewed. The patient's                            tolerance of previous anesthesia was reviewed.                           - The risks and benefits of the procedure and the                            sedation options and risks were discussed with the                            patient. All questions were answered and informed                            consent was obtained.                           - ASA Grade Assessment: II - A patient with mild                            systemic disease.                           After obtaining informed consent, the endoscope was                            passed under direct vision. Throughout the  procedure, the patient's blood pressure, pulse, and                            oxygen saturations were monitored continuously. The                            [id number]-H190 ([id number]) scope was introduced through the                            mouth, and advanced to the jejunum. The upper GI                            endoscopy was accomplished without difficulty.  The                            patient tolerated the procedure well. Scope In: 1:15:56 PM Scope Out: 1:22:41 PM Total Procedure Duration: 0 hours 6 minutes 45 seconds  Findings:      The examined esophagus was normal.      Evidence of a gastric bypass was found in the stomach. This was       characterized by striped erythema in the gastric pouch. GJ anastomosis       looked healthy. The body of the pouch had evidence of a gastrogastric       fistula with presence of sutures.      Segmental inflammation characterized by erosions and erythema was found       in the excluded stomach, at the level of the gastric body and in the       gastric antrum. Biopsies were taken with a cold forceps for Helicobacter       pylori testing.      The examined duodenum was normal.      The examined jejunum (afferent and efferent limbs) was normal. Impression:               - Normal esophagus.                           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                           - Gastritis. Biopsied.                           - Normal examined duodenum.                           - Normal examined jejunum. Moderate Sedation:      Per Anesthesia Care Recommendation:           - Discharge patient to home (ambulatory).                           -  Resume previous diet.                           - Await pathology results.                           - No Celebrex, meloxicam, Mobic, aspirin,                            ibuprofen, naproxen, or other non-steroidal                            anti-inflammatory drugs.                           - Follow up with [facility] regarding                            gastrogastric fistula - Dr. Corliss Parish.                           - If negative biopsies, proceed with H. pylori                            stool testing (kit to be collected at the  GI office)                           - Continue present medications. Procedure Code(s):        --- Professional ---                           [phone], Esophagogastroduodenoscopy, flexible,                            transoral; with biopsy, single or multiple Diagnosis Code(s):        --- Professional ---                           [phone], Bariatric surgery status                           K29.70, Gastritis, unspecified, without bleeding                           R10.10, Upper abdominal pain, unspecified                           K92.1, Melena (includes Hematochezia) CPT copyright 2022 American [employer] Association. All rights reserved. The codes documented in this report are preliminary and upon coder review may  be revised to meet current compliance requirements. Katrinka Blazing, MD Katrinka Blazing,  06/07/2022 1:42:12 PM This report has been signed electronically. Number of Addenda: 0

## 2022-06-07 NOTE — Discharge Instructions (Addendum)
You are being discharged to home.  Resume your previous diet.  We are waiting for your pathology results.  Do not take any Celebrex, meloxicam, Mobic aspirin, ibuprofen (including Advil, Motrin or Nuprin), naproxen (including Aleve), or any other non-steroidal anti-inflammatory drugs.  If negative biopsies, proceed with H. pylori stool testing (kit to be collected at the GI office) Follow up with [facility] regarding gastrogastric fistula - Dr. Corliss Parish. Continue your present medications.

## 2022-06-07 NOTE — Interval H&P Note (Signed)
History and Physical Interval Note:  06/07/2022 1:02 PM  Debbie Pineda  has presented today for surgery, with the diagnosis of melena, LUQ pain.  The various methods of treatment have been discussed with the patient and family. After consideration of risks, benefits and other options for treatment, the patient has consented to  Procedure(s) with comments: ESOPHAGOGASTRODUODENOSCOPY (EGD) WITH PROPOFOL (N/A) - 2:00pm;asa 3 as a surgical intervention.  The patient's history has been reviewed, patient examined, no change in status, stable for surgery.  I have reviewed the patient's chart and labs.  Questions were answered to the patient's satisfaction.     Katrinka Blazing Mayorga

## 2022-06-07 NOTE — Anesthesia Preprocedure Evaluation (Signed)
Anesthesia Evaluation  Patient identified by MRN, date of birth, ID band Patient awake    Reviewed: Allergy & Precautions, H&P , NPO status , Patient's Chart, lab work & pertinent test results  History of Anesthesia Complications (+) AWARENESS UNDER ANESTHESIA, Family history of anesthesia reaction and history of anesthetic complications  Airway Mallampati: II  TM Distance: >3 FB Neck ROM: Full    Dental  (+) Dental Advisory Given, Caps,    Pulmonary sleep apnea and Continuous Positive Airway Pressure Ventilation , former smoker   Pulmonary exam normal breath sounds clear to auscultation       Cardiovascular Normal cardiovascular exam+ dysrhythmias (PVCs)  Rhythm:Regular Rate:Normal     Neuro/Psych  Headaches PSYCHIATRIC DISORDERS     Dementia TIA Neuromuscular disease CVA, Residual Symptoms    GI/Hepatic Neg liver ROS,GERD  Medicated and Controlled,,Gastric bypass   Endo/Other  Hypothyroidism  Morbid obesity  Renal/GU negative Renal ROS  negative genitourinary   Musculoskeletal  (+) Arthritis , Osteoarthritis,  Fibromyalgia -, narcotic dependent  Abdominal   Peds negative pediatric ROS (+)  Hematology  (+) Blood dyscrasia (Factor V Leiden mutation (HCC)), anemia   Anesthesia Other Findings Scoliosis, lumbar region  Reproductive/Obstetrics negative OB ROS                             Anesthesia Physical Anesthesia Plan  ASA: 3  Anesthesia Plan: General   Post-op Pain Management: Minimal or no pain anticipated   Induction: Intravenous  PONV Risk Score and Plan: 1 and Propofol infusion  Airway Management Planned: Nasal Cannula and Natural Airway  Additional Equipment:   Intra-op Plan:   Post-operative Plan:   Informed Consent: I have reviewed the patients History and Physical, chart, labs and discussed the procedure including the risks, benefits and alternatives for the  proposed anesthesia with the patient or authorized representative who has indicated his/her understanding and acceptance.     Dental advisory given  Plan Discussed with: CRNA and Surgeon  Anesthesia Plan Comments:        Anesthesia Quick Evaluation

## 2022-06-08 LAB — SURGICAL PATHOLOGY

## 2022-06-12 ENCOUNTER — Encounter (HOSPITAL_COMMUNITY): Payer: Self-pay | Admitting: Gastroenterology

## 2022-06-12 ENCOUNTER — Encounter: Payer: PPO | Attending: Family Medicine | Admitting: Nutrition

## 2022-06-12 NOTE — Progress Notes (Signed)
Medical Nutrition Therapy  Appointment Start time:  1130  Appointment End time:  1200  Primary concerns today: Obesity  Referral diagnosis: E66.01 Preferred learning style: No Preference   Learning readiness: Ready  NUTRITION ASSESSMENT  Follow up obesity Still not eating on schedule. Eating lunch and supper, but skips breakfast.   Anthropometrics   06/06/2022  Weight /BMI   Weight 277 lb   Height 5\' 5"  (1.651 m)   BMI 46.1 kg/m2        Clinical Medical Hx:  Past Medical History:  Diagnosis Date   ADD (attention deficit disorder)    Arthritis    "all over"   Cerebral infarction (HCC) 10/30/2011   Cerebrovascular disease 08/14/2016   Chronic back pain    "all over"   Chronic low back pain 01/11/2016   Complication of anesthesia    tends to have hypotension when NPO and post-anesthesia   Constipation    takes stool softener daily   Degenerative disk disease    Degenerative joint disease    DVT (deep venous thrombosis) (HCC) 2014   RLE   Family history of adverse reaction to anesthesia    a family member woke up during surgery; "think it was my mom"   Fibromyalgia    Generalized osteoarthritis of multiple sites 11/04/2013   GERD (gastroesophageal reflux disease)    Headache    Heart palpitations 12/14/2013   resolved   History of blood clots    superficial   Hypoglycemia    Hypothyroid    takes Synthroid daily   Incomplete emptying of bladder    Insomnia    takes Trazodone nightly   Iron deficiency anemia    takes Ferrous Sulfate daily   Joint pain    Joint swelling    knees and ankles   Memory disorder 08/14/2016   Morbid obesity (HCC)    Nausea    takes Zofran as needed.Seeing GI doc   Neck pain 05/25/2016   OSA on CPAP    tested more than 5 yrs ago.     Osteoarthritis    Osteopenia    in feet   PFO (patent foramen ovale)    no murmur   PFO (patent foramen ovale)    Pre-diabetes    Primary osteoarthritis of both feet 05/29/2016   Right  bunionectomy August 2017 by Dr. Lajoyce Corners   PVC's (premature ventricular contractions) 10/11/2021   Monitor 9/23: 1.8% PVCs; rare PACs; o/w NSR   Scoliosis    Skin abnormality 02/04/2020   raised area on lip   Stroke [facility]) "several"last 2014   right foot weakness; memory issues, black spot right visual field since" (03/23/2015)   Thrombophlebitis    Trochanteric bursitis of both hips 05/25/2016   Unilateral primary osteoarthritis, right knee 05/29/2016   Urinary urgency 04/19/2016   Vein disorder 11/29/2010   varicose veins both legs   Wears glasses     Medications:  Current Outpatient Medications on File Prior to Visit  Medication Sig Dispense Refill   baclofen (LIORESAL) 10 MG tablet Take 1 tablet (10 mg total) by mouth 3 (three) times daily. 90 each 0   Blood Glucose Monitoring Suppl (FREESTYLE PRECISION NEO SYSTEM) w/Device KIT USE 1 STRIP TO CHECK GLUCOSE 4 TIMES DAILY AND AS NEEDED     diclofenac sodium (VOLTAREN) 1 % GEL Apply 4 g topically 4 (four) times daily as needed (PAIN). 5 Tube 1   donepezil (ARICEPT) 10 MG tablet Take 1 tablet by mouth at bedtime.  90 tablet 2   ferrous gluconate (FERGON) 324 MG tablet Take 1 tablet (324 mg total) by mouth 2 (two) times daily with a meal. 60 tablet 0   folic acid (FOLVITE) 400 MCG tablet Take 400 mcg by mouth daily.     gabapentin (NEURONTIN) 800 MG tablet 800 mg. One tid     Krill Oil 350 MG CAPS Take 350 mg by mouth at bedtime.      levothyroxine (SYNTHROID) 125 MCG tablet Take 1 tablet (125 mcg total) by mouth daily before breakfast. 30 tablet 0   methylphenidate (CONCERTA) 27 MG PO CR tablet Take 1 tablet (27 mg total) by mouth 2 (two) times daily. 10 am & 1400 60 tablet 0   miconazole (ZEASORB-AF) 2 % powder Every shift as needed. Apply to red areas in folds of skin.     mirabegron ER (MYRBETRIQ) 50 MG TB24 tablet Take 1 tablet (50 mg total) by mouth daily. 30 tablet 0   Multiple Minerals-Vitamins (CAL-MAG-ZINC-D PO) Take 3 tablets by  mouth daily. 333 mg-133 unit -133 mg-5 mg     naloxegol oxalate (MOVANTIK) 25 MG TABS tablet TAKE (1) TABLET BY MOUTH ONCE DAILY. 30 tablet 3   NARCAN 4 MG/0.1ML LIQD nasal spray kit Place 0.1 sprays (0.4 mg total) into the nose 2 (two) times daily as needed. 1 each 0   NUCYNTA ER 100 MG 12 hr tablet SMARTSIG:1 Tablet(s) By Mouth Every 12 Hours     ondansetron (ZOFRAN-ODT) 4 MG disintegrating tablet Take 1 tablet (4 mg total) by mouth every 8 (eight) hours as needed for nausea or vomiting. 20 tablet 0   ONETOUCH ULTRA test strip      OneTouch UltraSoft 2 Lancets MISC      Oxycodone HCl 10 MG TABS Take 10 mg by mouth every 6 (six) hours as needed.     rivaroxaban (XARELTO) 20 MG TABS tablet Take 1 tablet (20 mg total) by mouth daily at 6 PM. 30 tablet 0   tiZANidine (ZANAFLEX) 4 MG tablet Take 1 tablet (4 mg total) by mouth every 6 (six) hours as needed for muscle spasms. 30 tablet 0   traZODone (DESYREL) 100 MG tablet Take 0.5 tablets (50 mg total) by mouth at bedtime. 15 tablet 0   triamcinolone cream (KENALOG) 0.1 % SMARTSIG:1 Application Topical 2-3 Times Daily     trospium (SANCTURA) 20 MG tablet Take 1 tablet (20 mg total) by mouth 2 (two) times daily. 60 tablet 0   vitamin C (ASCORBIC ACID) 500 MG tablet Take 500 mg by mouth daily.     No current facility-administered medications on file prior to visit.    Labs:     Latest Ref Rng & Units 06/06/2022    1:17 PM 10/11/2021   11:05 AM 09/06/2021   11:19 AM  CMP  Glucose 70 - 99 mg/dL 73  96  952   BUN 6 - 20 mg/dL 11  11  10    Creatinine 0.44 - 1.00 mg/dL 8.41  3.24  4.01   Sodium 135 - 145 mmol/L 138  143  136   Potassium 3.5 - 5.1 mmol/L 4.1  4.6  3.4   Chloride 98 - 111 mmol/L 108  103  103   CO2 22 - 32 mmol/L 22  24  24    Calcium 8.9 - 10.3 mg/dL 8.8  9.5  9.4   Total Protein 6.5 - 8.1 g/dL   7.1   Total Bilirubin 0.3 - 1.2 mg/dL  0.9   Alkaline Phos 38 - 126 U/L   150   AST 15 - 41 U/L   17   ALT 0 - 44 U/L   13    Lipid  Panel     Component Value Date/Time   CHOL 151 05/02/2017 0419   TRIG 54 05/02/2017 0419   HDL 74 05/02/2017 0419   CHOLHDL 2.0 05/02/2017 0419   VLDL 11 05/02/2017 0419   LDLCALC 66 05/02/2017 0419   Lab Results  Component Value Date   HGBA1C 5.0 05/02/2017    Notable Signs/Symptoms: None  Lifestyle & Dietary Hx Married and helps her mom  Estimated daily fluid intake: 60-75 oz Supplements: See chart Sleep: 5-11 hrs Stress / self-care: stress caring for her mom and being away from husband Current average weekly physical activity: Working in the yard  24-Hr Dietary Recall First Meal: Cappachino 16 oz and 2 bottles of water 3 pm 1/2 [place] meld from [employer], water 1/2 of a sweet tea large Snack: 3 sugar free popsycles  Third Meal:  9 pm 2 slices of delux pizza, 20 oz of water Snack: 3 sugar free popcycle, water Beverages: water and tea    Estimated Energy Needs Calories: 1200 Carbohydrate: 135g Protein: 90g Fat: 33g   NUTRITION DIAGNOSIS  NI-1.7 Predicted excessive energy intake As related to HIgh calorie diet.  As evidenced by BMI 48.   NUTRITION INTERVENTION  Nutrition education (E-1) on the following topics:  Lifestyle Medicine  - Whole Food, Plant Predominant Nutrition is highly recommended: Eat Plenty of vegetables, Mushrooms, fruits, Legumes, Whole Grains, Nuts, seeds in lieu of processed meats, processed snacks/pastries red meat, poultry, eggs.    -It is better to avoid simple carbohydrates including: Cakes, Sweet Desserts, Ice Cream, Soda (diet and regular), Sweet Tea, Candies, Chips, Cookies, Store Bought Juices, Alcohol in Excess of  1-2 drinks a day, Lemonade,  Artificial Sweeteners, Doughnuts, Coffee Creamers, "Sugar-free" Products, etc, etc.  This is not a complete list.....  Exercise: If you are able: 30 -60 minutes a day ,4 days a week, or 150 minutes a week.  The longer the better.  Combine stretch, strength, and aerobic activities.  If you  were told in the past that you have high risk for cardiovascular diseases, you may seek evaluation by your heart doctor prior to initiating moderate to intense exercise programs.     Handouts Provided Include  Lifestyle Medicine handouts  Learning Style & Readiness for Change Teaching method utilized: Visual & Auditory  Demonstrated degree of understanding via: Teach Back  Barriers to learning/adherence to lifestyle change: none  Goals Established by Pt Goals  Cut back to 1 sugar free popcycle a day. Increase fresh fruits and vegetables Increase nutrient dense foods Cut back on fast foods Lose 2 lbs a month   MONITORING & EVALUATION Dietary intake, weekly physical activity, and weight in 3 months.  Next Steps  Patient is to work on increasing exercise and work on meal planning to eat more nutrient dense foods.Marland Kitchen

## 2022-06-12 NOTE — Patient Instructions (Addendum)
Goals  Cut back to 1 sugar free popcycle a day. Increase fresh fruits and vegetables Increase nutrient dense foods Cut back on fast foods Lose 2 lbs a month

## 2022-06-13 ENCOUNTER — Other Ambulatory Visit: Payer: Self-pay

## 2022-06-13 ENCOUNTER — Ambulatory Visit: Payer: PPO | Admitting: Sports Medicine

## 2022-06-13 ENCOUNTER — Encounter: Payer: Self-pay | Admitting: Sports Medicine

## 2022-06-13 DIAGNOSIS — G8929 Other chronic pain: Secondary | ICD-10-CM | POA: Diagnosis not present

## 2022-06-13 DIAGNOSIS — Z981 Arthrodesis status: Secondary | ICD-10-CM

## 2022-06-13 DIAGNOSIS — M533 Sacrococcygeal disorders, not elsewhere classified: Secondary | ICD-10-CM

## 2022-06-13 NOTE — Progress Notes (Signed)
Debbie Pineda - 62 y.o. female MRN [id number]  Date of birth: 08/10/1960  Office Visit Note: Visit Date: 06/13/2022 PCP: Debbie Epley, PA-C Referred by: Debbie Epley, PA*  Subjective: Chief Complaint  Patient presents with   Right Hip - Pain   HPI: Debbie Pineda is a pleasant 62 y.o. female who presents today for acute on chronic right SI-joint pain.  History of previous T10-S1 PSIF with multi-level TLIFs. Also seeing Dr. Christell Constant currently, treating conservatively. Not interested in SI-joint fusion currently. She is taking her chronic pain medication oxycodone 10 mg every 6 hours as needed.  Also takes baclofen 10 mg fibrofatty muscular pain.  We did perform an ultrasound-guided right SI joint injection just short of 3 months prior, gave her good relief for a month or so although her pain is started to return.  Pertinent ROS were reviewed with the patient and found to be negative unless otherwise specified above in HPI.   Assessment & Plan: Visit Diagnoses:  1. Chronic right SI joint pain   2. S/P lumbar fusion    Plan: Discussed with [name] treatment options for her right SI joint pain and dysfunction.  She has responded well to ultrasound-guided injections, through shared decision making get repeat this for the right SI joint today.  She tolerated well.  Allow for 48 hours of modified activity and rest, ice and Tylenol for any postinjection pain.  Starting on Friday she may return into her cat/cow yoga rehab exercises as well as pelvic tilt rehab at home twice daily.  She will continue her chronic oxycodone 10 mg to be taken every 6 hours as needed.  We will follow-up in about 3 months, may call or return sooner if any issues arise.  Follow-up: Return in about 3 months (around 09/13/2022) for SI-joint (30-mins for inj).   Meds & Orders: No orders of the defined types were placed in this encounter.   Orders Placed This Encounter  Procedures   US Guided Needle  Placement - No Linked Charges     Procedures: U/S-guided SI-joint injection, Right   After discussion of risk/benefits/indications, informed verbal consent was obtained. A timeout was then performed. The patient was positioned in a prone position on exam room table with a pillow placed under the pelvis for mild hip flexion. The SI joint area was cleaned and prepped with betadine and alcohol swabs. Sterile ultrasound gel was applied and the ultrasound transducer was placed in an anatomic axial plane over the PSIS, then moved distally over the SI-joint. Using ultrasound guidance, a 22-gauge, 3.5" needle was inserted from a medial to lateral approach utilizing an in-plane approach and directed into the SI-joint. The SI-joint was then injected with a mixture of 4:2 lidocaine:depomedrol with visualization of the injectate flow into the SI-joint under ultrasound visualization. The patient tolerated the procedure well without immediate complications.       Clinical History: No specialty comments available.  She reports that she quit smoking about 31 years ago. Her smoking use included cigarettes. She has a 6.00 pack-year smoking history. She has been exposed to tobacco smoke. She has never used smokeless tobacco. No results for input(s): "HGBA1C", "LABURIC" in the last 8760 hours.  Objective:   Vital Signs: There were no vitals taken for this visit.  Physical Exam  Gen: Well-appearing, in no acute distress; non-toxic CV: Well-perfused. Warm.  Resp: Breathing unlabored on room air; no wheezing. Psych: Fluid speech in conversation; appropriate affect; normal thought process Neuro:  Sensation intact throughout. No gross coordination deficits.   Ortho Exam - Lumbar/SI: + TTP over R-SI joint region just on and distal to PSIS.  Large vertical incision from her previous surgery, no signs of infection.  Imaging: No results found.  Past Medical/Family/Surgical/Social History: Medications & Allergies  reviewed per EMR, new medications updated. Patient Active Problem List   Diagnosis Date Noted   Melena 05/15/2022   PVC's (premature ventricular contractions) 10/11/2021   Factor V Leiden mutation (HCC) 10/11/2021   Other secondary scoliosis, lumbar region    Other spondylosis with radiculopathy, lumbar region    Spondylolisthesis, lumbar region    Thoracic spondylosis with radiculopathy    Radiculopathy, lumbar region    Breakdown (mechanical) of implanted electronic neurostimulator of spinal cord electrode (lead), sequela    Degenerative disc disease, lumbar    At risk for adverse drug event 08/31/2021   Acute on chronic blood loss anemia 08/28/2021   Drug-induced constipation 08/28/2021   GERD without esophagitis 08/28/2021   Overactive bladder 08/28/2021   Chronic migraine without aura 08/28/2021   Hx of blood clots 08/28/2021   Major neurocognitive disorder (HCC) 08/28/2021   Status post lumbar spinal fusion 08/21/2021   Weight gain 07/13/2021   Encounter for removal of internal fixation device 10/21/2020   LFTs abnormal 03/02/2020   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   Gastroesophageal reflux disease 09/22/2019   SSBE (short-segment Barrett's esophagus) 09/22/2019   Flatulence 09/22/2019   Migraine 11/11/2018   Abdominal pain, epigastric 10/14/2018   LUQ pain 10/14/2018   Attention deficit hyperactivity disorder (ADHD) 01/12/2018   Insomnia 01/12/2018   Elevated liver enzymes 09/10/2017   History of diabetes mellitus 06/06/2017   Primary osteoarthritis of right hand 06/06/2017   Transaminasemia    TIA (transient ischemic attack) 05/01/2017   Dysphasia 05/01/2017   Chronic pain syndrome 05/01/2017   Confusion    Presence of right artificial knee joint 09/17/2016   H/O total knee replacement, right 08/15/2016   Presence of retained hardware    Memory disorder 08/14/2016   Cerebrovascular disease 08/14/2016   Unilateral primary  osteoarthritis, right knee 05/29/2016   Primary osteoarthritis of both feet 05/29/2016   Trochanteric bursitis of both hips 05/25/2016   Neck pain 05/25/2016   Urinary urgency 04/19/2016   Chronic low back pain 01/11/2016   Total knee replacement status 03/23/2015   Heart palpitations 12/14/2013   Generalized osteoarthritis of multiple sites 11/04/2013   Elevated LFTs 03/17/2012   Cerebral infarction (HCC) 10/30/2011   PFO (patent foramen ovale) 10/30/2011   Acquired hypothyroidism    Thrombophlebitis    OSA on CPAP    Fibromyalgia    Status post bariatric surgery 12/07/2010   Vein disorder 11/29/2010   S/P total hysterectomy and bilateral salpingo-oophorectomy 11/29/2010   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   S/P cholecystectomy 11/29/2010   S/P ACL surgery 11/29/2010   Morbid obesity (HCC) 11/10/2010   Past Medical History:  Diagnosis Date   ADD (attention deficit disorder)    Arthritis    "all over"   Cerebral infarction (HCC) 10/30/2011   Cerebrovascular disease 08/14/2016   Chronic back pain    "all over"   Chronic low back pain 01/11/2016   Complication of anesthesia    tends to have hypotension when NPO and post-anesthesia   Constipation    takes stool softener daily   Degenerative disk disease    Degenerative joint disease    DVT (deep venous thrombosis) (HCC) 2014  RLE   Family history of adverse reaction to anesthesia    a family member woke up during surgery; "think it was my mom"   Fibromyalgia    Generalized osteoarthritis of multiple sites 11/04/2013   GERD (gastroesophageal reflux disease)    Headache    Heart palpitations 12/14/2013   resolved   History of blood clots    superficial   Hypoglycemia    Hypothyroid    takes Synthroid daily   Incomplete emptying of bladder    Insomnia    takes Trazodone nightly   Iron deficiency anemia    takes Ferrous Sulfate daily   Joint pain    Joint swelling    knees and ankles   Memory  disorder 08/14/2016   Morbid obesity (HCC)    Nausea    takes Zofran as needed.Seeing GI doc   Neck pain 05/25/2016   OSA on CPAP    tested more than 5 yrs ago.     Osteoarthritis    Osteopenia    in feet   PFO (patent foramen ovale)    no murmur   PFO (patent foramen ovale)    Pre-diabetes    Primary osteoarthritis of both feet 05/29/2016   Right bunionectomy August 2017 by Dr. Lajoyce Corners   PVC's (premature ventricular contractions) 10/11/2021   Monitor 9/23: 1.8% PVCs; rare PACs; o/w NSR   Scoliosis    Skin abnormality 02/04/2020   raised area on lip   Stroke [facility]) "several"last 2014   right foot weakness; memory issues, black spot right visual field since" (03/23/2015)   Thrombophlebitis    Trochanteric bursitis of both hips 05/25/2016   Unilateral primary osteoarthritis, right knee 05/29/2016   Urinary urgency 04/19/2016   Vein disorder 11/29/2010   varicose veins both legs   Wears glasses    Family History  Problem Relation Age of Onset   Cancer Mother        skin and breast   Myasthenia gravis Mother    Breast cancer Mother    Heart disease Father    Cancer Father    Parkinson's disease Father    Heart disease Sister    Heart attack Sister    Heart disease Brother    Cancer Brother    Diabetes Brother    Stroke Brother    Cancer Maternal Grandfather    Hypothyroidism Daughter    Hypertension Other    Colon cancer Neg Hx    Past Surgical History:  Procedure Laterality Date   BIOPSY  11/14/2018   Procedure: BIOPSY;  Surgeon: Malissa Hippo, MD;  Location: [facility];  Service: Endoscopy;;  esophagusgastric   BIOPSY  06/07/2022   Procedure: BIOPSY;  Surgeon: Dolores Frame, MD;  Location: [facility];  Service: Gastroenterology;;   BONE EXCISION Right 08/29/2017   Procedure: right trapezium excision;  Surgeon: Cindee Salt, MD;  Location: [facility];  Service: Orthopedics;  Laterality: Right;   BUNIONECTOMY Right 08/2015   CARDIAC  CATHETERIZATION     2008.  "it was fine" (not sure why she had it done, and doesn't know where)   CARPOMETACARPEL SUSPENSION PLASTY Right 08/29/2017   Procedure: SUSPENSION PLASTY RIGHT THUMB;  Surgeon: Cindee Salt, MD;  Location: [facility];  Service: Orthopedics;  Laterality: Right;   COLONOSCOPY N/A 03/25/2013   Procedure: COLONOSCOPY;  Surgeon: Malissa Hippo, MD;  [facility]: [facility];  Service: Endoscopy;  Laterality: N/A;  930   ERCP  06/02/2020  ESOPHAGOGASTRODUODENOSCOPY     ESOPHAGOGASTRODUODENOSCOPY (EGD) WITH PROPOFOL N/A 11/14/2018   Procedure: ESOPHAGOGASTRODUODENOSCOPY (EGD) WITH PROPOFOL;  Surgeon: Malissa Hippo, MD;  [facility]: [facility];  Service: Endoscopy;  Laterality: N/A;  1:55pm-office moved to 11:00am/pt notified to arrive at 9:30am per KF   ESOPHAGOGASTRODUODENOSCOPY (EGD) WITH PROPOFOL N/A 06/07/2022   Procedure: ESOPHAGOGASTRODUODENOSCOPY (EGD) WITH PROPOFOL;  Surgeon: Dolores Frame, MD;  [facility]: [facility];  Service: Gastroenterology;  Laterality: N/A;  2:00pm;asa 3   EXPLORATORY LAPAROTOMY     "took fallopian tubes out"   HALLUX FUSION Right 02/12/2020   Procedure: HALLUX INTERPHANGEAL JOINT  FUSION;  Surgeon: Candelaria Stagers, DPM;  Location: [facility];  Service: Podiatry;  Laterality: Right;   HAMMER TOE SURGERY Right 02/12/2020   Procedure: SECOND AND THIRD HAMMER TOE CORRECTION; CAPSULOTOMY SECOND INTERPHALANGEAL JOINT;  Surgeon: Candelaria Stagers, DPM;  Location: [facility];  Service: Podiatry;  Laterality: Right;   JOINT REPLACEMENT     bil knee    KNEE ARTHROSCOPY Left    KNEE ARTHROSCOPY W/ ACL RECONSTRUCTION Right yrs ago   "added pins"   LAPAROSCOPIC CHOLECYSTECTOMY  ~ 2001   ROUX-EN-Y GASTRIC BYPASS  11/20/2010   [place]   SPINAL CORD STIMULATOR INSERTION N/A 04/18/2017   Procedure: LUMBAR SPINAL CORD STIMULATOR INSERTION;  Surgeon: Odette Fraction, MD;  Location: [facility];  Service:  Neurosurgery;  Laterality: N/A;  LUMBAR SPINAL CORD STIMULATOR INSERTION   stomach stent  04/28/2020   TENDON TRANSFER Right 08/29/2017   Procedure: right abductor pollicis longus transfer;  Surgeon: Cindee Salt, MD;  Location: [facility];  Service: Orthopedics;  Laterality: Right;   TOTAL KNEE ARTHROPLASTY Left 03/23/2015   Procedure: TOTAL KNEE ARTHROPLASTY;  Surgeon: Nadara Mustard, MD;  Location: [facility];  Service: Orthopedics;  Laterality: Left;   TOTAL KNEE ARTHROPLASTY Right 08/15/2016   Procedure: RIGHT TOTAL KNEE ARTHROPLASTY, REMOVAL ACL SCREWS;  Surgeon: Nadara Mustard, MD;  Location: [facility];  Service: Orthopedics;  Laterality: Right;   TOTAL KNEE ARTHROPLASTY WITH HARDWARE REMOVAL Right    VAGINAL HYSTERECTOMY     tah/bso   VARICOSE VEIN SURGERY Right X 2   WEIL OSTEOTOMY Right 02/12/2020   Procedure: DOUBLE L OSTEOTOMY;  Surgeon: Candelaria Stagers, DPM;  Location:  S[facility];  Service: Podiatry;  Laterality: Right;   Social History   Occupational History   Occupation: Disability   Occupation: formerly Charity fundraiser, [employer]  Tobacco Use   Smoking status: Former    Packs/day: 0.75    Years: 8.00    Additional pack years: 0.00    Total pack years: 6.00    Types: Cigarettes    Quit date: 12/01/1990    Years since quitting: 31.5    Passive exposure: Past   Smokeless tobacco: Never  Vaping Use   Vaping Use: Never used  Substance and Sexual Activity   Alcohol use: No    Comment: 03/23/2015 "stopped drinking in 2012 w/gastric bypass; drank socially before bypass"   Drug use: No   Sexual activity: Not Currently    Birth control/protection: Surgical

## 2022-06-14 DIAGNOSIS — R7309 Other abnormal glucose: Secondary | ICD-10-CM | POA: Diagnosis not present

## 2022-06-14 DIAGNOSIS — Z6841 Body Mass Index (BMI) 40.0 and over, adult: Secondary | ICD-10-CM | POA: Diagnosis not present

## 2022-06-14 DIAGNOSIS — F902 Attention-deficit hyperactivity disorder, combined type: Secondary | ICD-10-CM | POA: Diagnosis not present

## 2022-06-17 ENCOUNTER — Other Ambulatory Visit (INDEPENDENT_AMBULATORY_CARE_PROVIDER_SITE_OTHER): Payer: Self-pay | Admitting: Gastroenterology

## 2022-06-17 ENCOUNTER — Other Ambulatory Visit: Payer: Self-pay | Admitting: Psychiatry

## 2022-06-17 DIAGNOSIS — Z9884 Bariatric surgery status: Secondary | ICD-10-CM

## 2022-06-17 DIAGNOSIS — K219 Gastro-esophageal reflux disease without esophagitis: Secondary | ICD-10-CM

## 2022-06-19 ENCOUNTER — Other Ambulatory Visit: Payer: Self-pay | Admitting: *Deleted

## 2022-06-19 MED ORDER — TOPIRAMATE 100 MG PO TABS: 100.0000 mg | ORAL_TABLET | Freq: Two times a day (BID) | ORAL | 1 refills | Status: DC

## 2022-06-19 NOTE — Telephone Encounter (Signed)
Pt last seen on 02/18/21 per note "-Continue Topamax 100 mg BID for now. "  Follow up scheduled on 07/10/22  Last filled on 06/16/22 with Pill pack #60 tablets (30 day supply)

## 2022-06-20 DIAGNOSIS — M5416 Radiculopathy, lumbar region: Secondary | ICD-10-CM | POA: Diagnosis not present

## 2022-06-20 DIAGNOSIS — M79671 Pain in right foot: Secondary | ICD-10-CM | POA: Diagnosis not present

## 2022-06-20 DIAGNOSIS — M47816 Spondylosis without myelopathy or radiculopathy, lumbar region: Secondary | ICD-10-CM | POA: Diagnosis not present

## 2022-06-20 DIAGNOSIS — G894 Chronic pain syndrome: Secondary | ICD-10-CM | POA: Diagnosis not present

## 2022-06-21 NOTE — Progress Notes (Unsigned)
Office Visit Note  Patient: Debbie Pineda             Date of Birth: 04/18/1960           MRN: [id number]             PCP: Avis Epley, PA-C Referring: Avis Epley, [place]* Visit Date: 07/03/2022 Occupation: @GUAROCC @  Subjective:  Trapezius muscle tension and tenderness bilaterally   History of Present Illness: [name] is a 62 y.o. female with history of fibromyalgia and osteoarthritis.  Patient presents today with ongoing trapezius muscle tension and tenderness bilaterally.  She had trigger point injections performed on 12/26/2021 which provided relief for several weeks.  She requested repeat trigger point injections today.  She continues to follow-up with pain management on a monthly basis.  She remains on cymbalta 60 mg 2 capsules daily.  Patient states that she is planning on increasing her walking regimen.  She is also planning on initiating a diet.  Activities of Daily Living:  Patient reports morning stiffness for 4 hours.   Patient Reports nocturnal pain.  Difficulty dressing/grooming: Denies Difficulty climbing stairs: Reports Difficulty getting out of chair: Reports Difficulty using hands for taps, buttons, cutlery, and/or writing: Denies  Review of Systems  Constitutional:  Positive for fatigue.  HENT:  Positive for mouth sores and mouth dryness.   Eyes:  Positive for dryness.  Respiratory:  Negative for shortness of breath.   Cardiovascular:  Negative for chest pain and palpitations.  Gastrointestinal:  Positive for blood in stool and constipation. Negative for diarrhea.  Endocrine: Negative for increased urination.  Genitourinary:  Positive for involuntary urination.  Musculoskeletal:  Positive for joint pain, gait problem, joint pain, joint swelling, myalgias, muscle weakness, morning stiffness, muscle tenderness and myalgias.  Skin:  Positive for rash, hair loss and sensitivity to sunlight. Negative for color change.  Allergic/Immunologic:  Negative for susceptible to infections.  Neurological:  Positive for headaches. Negative for dizziness.  Hematological:  Negative for swollen glands.  Psychiatric/Behavioral:  Positive for sleep disturbance. Negative for depressed mood. The patient is not nervous/anxious.     PMFS History:  Patient Active Problem List   Diagnosis Date Noted   Melena 05/15/2022   PVC's (premature ventricular contractions) 10/11/2021   Factor V Leiden mutation (HCC) 10/11/2021   Other secondary scoliosis, lumbar region    Other spondylosis with radiculopathy, lumbar region    Spondylolisthesis, lumbar region    Thoracic spondylosis with radiculopathy    Radiculopathy, lumbar region    Breakdown (mechanical) of implanted electronic neurostimulator of spinal cord electrode (lead), sequela    Degenerative disc disease, lumbar    At risk for adverse drug event 08/31/2021   Acute on chronic blood loss anemia 08/28/2021   Drug-induced constipation 08/28/2021   GERD without esophagitis 08/28/2021   Overactive bladder 08/28/2021   Chronic migraine without aura 08/28/2021   Hx of blood clots 08/28/2021   Major neurocognitive disorder (HCC) 08/28/2021   Status post lumbar spinal fusion 08/21/2021   Weight gain 07/13/2021   Encounter for removal of internal fixation device 10/21/2020   LFTs abnormal 03/02/2020   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   Gastroesophageal reflux disease 09/22/2019   SSBE (short-segment Barrett's esophagus) 09/22/2019   Flatulence 09/22/2019   Migraine 11/11/2018   Abdominal pain, epigastric 10/14/2018   LUQ pain 10/14/2018   Attention deficit hyperactivity disorder (ADHD) 01/12/2018   Insomnia 01/12/2018   Elevated liver enzymes 09/10/2017   History  of diabetes mellitus 06/06/2017   Primary osteoarthritis of right hand 06/06/2017   Transaminasemia    TIA (transient ischemic attack) 05/01/2017   Dysphasia 05/01/2017   Chronic pain  syndrome 05/01/2017   Confusion    Presence of right artificial knee joint 09/17/2016   H/O total knee replacement, right 08/15/2016   Presence of retained hardware    Memory disorder 08/14/2016   Cerebrovascular disease 08/14/2016   Unilateral primary osteoarthritis, right knee 05/29/2016   Primary osteoarthritis of both feet 05/29/2016   Trochanteric bursitis of both hips 05/25/2016   Neck pain 05/25/2016   Urinary urgency 04/19/2016   Chronic low back pain 01/11/2016   Total knee replacement status 03/23/2015   Heart palpitations 12/14/2013   Generalized osteoarthritis of multiple sites 11/04/2013   Elevated LFTs 03/17/2012   Cerebral infarction (HCC) 10/30/2011   PFO (patent foramen ovale) 10/30/2011   Acquired hypothyroidism    Thrombophlebitis    OSA on CPAP    Fibromyalgia    Status post bariatric surgery 12/07/2010   Vein disorder 11/29/2010   S/P total hysterectomy and bilateral salpingo-oophorectomy 11/29/2010   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   S/P cholecystectomy 11/29/2010   S/P ACL surgery 11/29/2010   Morbid obesity (HCC) 11/10/2010    Past Medical History:  Diagnosis Date   ADD (attention deficit disorder)    Arthritis    "all over"   Cerebral infarction (HCC) 10/30/2011   Cerebrovascular disease 08/14/2016   Chronic back pain    "all over"   Chronic low back pain 01/11/2016   Complication of anesthesia    tends to have hypotension when NPO and post-anesthesia   Constipation    takes stool softener daily   Degenerative disk disease    Degenerative joint disease    DVT (deep venous thrombosis) (HCC) 2014   RLE   Family history of adverse reaction to anesthesia    a family member woke up during surgery; "think it was my mom"   Fibromyalgia    Generalized osteoarthritis of multiple sites 11/04/2013   GERD (gastroesophageal reflux disease)    Headache    Heart palpitations 12/14/2013   resolved   History of blood clots     superficial   Hypoglycemia    Hypothyroid    takes Synthroid daily   Incomplete emptying of bladder    Insomnia    takes Trazodone nightly   Iron deficiency anemia    takes Ferrous Sulfate daily   Joint pain    Joint swelling    knees and ankles   Memory disorder 08/14/2016   Morbid obesity (HCC)    Nausea    takes Zofran as needed.Seeing GI doc   Neck pain 05/25/2016   OSA on CPAP    tested more than 5 yrs ago.     Osteoarthritis    Osteopenia    in feet   PFO (patent foramen ovale)    no murmur   PFO (patent foramen ovale)    Pre-diabetes    Primary osteoarthritis of both feet 05/29/2016   Right bunionectomy August 2017 by Dr. Lajoyce Corners   PVC's (premature ventricular contractions) 10/11/2021   Monitor 9/23: 1.8% PVCs; rare PACs; o/w NSR   Scoliosis    Skin abnormality 02/04/2020   raised area on lip   Stroke [facility]) "several"last 2014   right foot weakness; memory issues, black spot right visual field since" (03/23/2015)   Thrombophlebitis    Trochanteric bursitis of both hips 05/25/2016  Unilateral primary osteoarthritis, right knee 05/29/2016   Urinary urgency 04/19/2016   Vein disorder 11/29/2010   varicose veins both legs   Wears glasses     Family History  Problem Relation Age of Onset   Cancer Mother        skin and breast   Myasthenia gravis Mother    Breast cancer Mother    Heart disease Father    Cancer Father        brain   Parkinson's disease Father    Heart disease Sister    Heart attack Sister    Heart disease Brother    Cancer Brother    Diabetes Brother    Stroke Brother    Cancer Maternal Grandfather    Hypothyroidism Daughter    Hypertension Other    Colon cancer Neg Hx    Past Surgical History:  Procedure Laterality Date   BIOPSY  11/14/2018   Procedure: BIOPSY;  Surgeon: Malissa Hippo, MD;  [facility];  Service: Endoscopy;;  esophagusgastric   BIOPSY  06/07/2022   Procedure: BIOPSY;  Surgeon: Dolores Frame,  MD;  [facility]: [facility];  Service: Gastroenterology;;   BONE EXCISION Right 08/29/2017   Procedure: right trapezium excision;  Surgeon: Cindee Salt, MD;  Location: [facility];  Service: Orthopedics;  Laterality: Right;   BUNIONECTOMY Right 08/2015   CARDIAC CATHETERIZATION     2008.  "it was fine" (not sure why she had it done, and doesn't know where)   CARPOMETACARPEL SUSPENSION PLASTY Right 08/29/2017   Procedure: SUSPENSION PLASTY RIGHT THUMB;  Surgeon: Cindee Salt, MD;  Location: [facility];  Service: Orthopedics;  Laterality: Right;   COLONOSCOPY N/A 03/25/2013   Procedure: COLONOSCOPY;  Surgeon: Malissa Hippo, MD;  [facility];  Service: Endoscopy;  Laterality: N/A;  930   ERCP  06/02/2020   ESOPHAGOGASTRODUODENOSCOPY     ESOPHAGOGASTRODUODENOSCOPY (EGD) WITH PROPOFOL N/A 11/14/2018   Procedure: ESOPHAGOGASTRODUODENOSCOPY (EGD) WITH PROPOFOL;  Surgeon: Malissa Hippo, MD;  [facility];  Service: Endoscopy;  Laterality: N/A;  1:55pm-office moved to 11:00am/pt notified to arrive at 9:30am per KF   ESOPHAGOGASTRODUODENOSCOPY (EGD) WITH PROPOFOL N/A 06/07/2022   Procedure: ESOPHAGOGASTRODUODENOSCOPY (EGD) WITH PROPOFOL;  Surgeon: Dolores Frame, MD;  [facility];  Service: Gastroenterology;  Laterality: N/A;  2:00pm;asa 3   EXPLORATORY LAPAROTOMY     "took fallopian tubes out"   HALLUX FUSION Right 02/12/2020   Procedure: HALLUX INTERPHANGEAL JOINT  FUSION;  Surgeon: Candelaria Stagers, DPM;  Location: [facility];  Service: Podiatry;  Laterality: Right;   HAMMER TOE SURGERY Right 02/12/2020   Procedure: SECOND AND THIRD HAMMER TOE CORRECTION; CAPSULOTOMY SECOND INTERPHALANGEAL JOINT;  Surgeon: Candelaria Stagers, DPM;  Location: [facility];  Service: Podiatry;  Laterality: Right;   JOINT REPLACEMENT     bil knee    KNEE ARTHROSCOPY Left    KNEE ARTHROSCOPY W/ ACL RECONSTRUCTION Right yrs ago    "added pins"   LAPAROSCOPIC CHOLECYSTECTOMY  ~ 2001   ROUX-EN-Y GASTRIC BYPASS  11/20/2010   [place]   SPINAL CORD STIMULATOR INSERTION N/A 04/18/2017   Procedure: LUMBAR SPINAL CORD STIMULATOR INSERTION;  Surgeon: Odette Fraction, MD;  Location: [facility];  Service: Neurosurgery;  Laterality: N/A;  LUMBAR SPINAL CORD STIMULATOR INSERTION   stomach stent  04/28/2020   TENDON TRANSFER Right 08/29/2017   Procedure: right abductor pollicis longus transfer;  Surgeon: Merlyn Lot,  Jillyn Hidden, MD;  Location: [facility];  Service: Orthopedics;  Laterality: Right;   TOTAL KNEE ARTHROPLASTY Left 03/23/2015   Procedure: TOTAL KNEE ARTHROPLASTY;  Surgeon: Nadara Mustard, MD;  Location: [facility];  Service: Orthopedics;  Laterality: Left;   TOTAL KNEE ARTHROPLASTY Right 08/15/2016   Procedure: RIGHT TOTAL KNEE ARTHROPLASTY, REMOVAL ACL SCREWS;  Surgeon: Nadara Mustard, MD;  Location: [facility];  Service: Orthopedics;  Laterality: Right;   TOTAL KNEE ARTHROPLASTY WITH HARDWARE REMOVAL Right    VAGINAL HYSTERECTOMY     tah/bso   VARICOSE VEIN SURGERY Right X 2   WEIL OSTEOTOMY Right 02/12/2020   Procedure: DOUBLE L OSTEOTOMY;  Surgeon: Candelaria Stagers, DPM;  Location: [facility];  Service: Podiatry;  Laterality: Right;   Social History   Social History Narrative   Lives with husband   Caffeine use: No soda   Mainly water, drinks decaf tea   Right handed   Immunization History  Administered Date(s) Administered   Influenza Split 11/01/2011, 08/23/2014   Influenza-Unspecified 08/23/2014, 11/22/2017   Moderna SARS-COV2 Booster Vaccination 12/16/2019, 12/31/2020   Moderna Sars-Covid-2 Vaccination 03/16/2019, 04/04/2019, 04/13/2019, 05/15/2019   Pneumococcal Polysaccharide-23 04/26/2017   Zoster Recombinat (Shingrix) 07/02/2017, 01/08/2018     Objective: Vital Signs: BP 120/82 (BP Location: Left Wrist, Patient Position: Sitting, Cuff Size: Normal)   Pulse 90   Resp 17   Ht 5\' 5"  (1.651 m)    Wt 267 lb 6.4 oz (121.3 kg)   BMI 44.50 kg/m    Physical Exam Vitals and nursing note reviewed.  Constitutional:      Appearance: She is well-developed.  HENT:     Head: Normocephalic and atraumatic.  Eyes:     Conjunctiva/sclera: Conjunctivae normal.  Cardiovascular:     Rate and Rhythm: Normal rate and regular rhythm.     Heart sounds: Normal heart sounds.  Pulmonary:     Effort: Pulmonary effort is normal.     Breath sounds: Normal breath sounds.  Abdominal:     General: Bowel sounds are normal.     Palpations: Abdomen is soft.  Musculoskeletal:     Cervical back: Normal range of motion.  Lymphadenopathy:     Cervical: No cervical adenopathy.  Skin:    General: Skin is warm and dry.     Capillary Refill: Capillary refill takes less than 2 seconds.  Neurological:     Mental Status: She is alert and oriented to person, place, and time.  Psychiatric:        Behavior: Behavior normal.      Musculoskeletal Exam: Generalized hyperalgesia and positive tender points on examination.  C-spine has limited range of motion without rotation.  Trapezius muscle tension and tenderness bilaterally.  Shoulder joints, elbow joints, wrist joints, MCPs, PIPs, DIPs have good range of motion with no synovitis.  Complete fist formation bilaterally.  Hip joints have good range of motion with no groin pain.  Knee replacements have good range of motion with no warmth or effusion.  Ankle joints have good range of motion with no tenderness or joint swelling.  CDAI Exam: CDAI Score: -- Patient Global: --; Provider Global: -- Swollen: --; Tender: -- Joint Exam 07/03/2022   No joint exam has been documented for this visit   There is currently no information documented on the homunculus. Go to the Rheumatology activity and complete the homunculus joint exam.  Investigation: No additional findings.  Imaging: XR Lumbar Spine Complete  Result Date: 06/06/2022 XR  of the lumbar spine from 06/06/2022  was independently reviewed and interpreted, showing subsidence of the L4/5 and L5/S1 TLIF cages. No lucency around the screws. Screws have not backed out.  The cranial most screw violates the endplate and enters the adjacent disc base.  No fractures seen.  No evidence of instability on flexion/extension views.  No broken instrumentation seen.   Recent Labs: Lab Results  Component Value Date   WBC 7.0 06/06/2022   HGB 12.9 06/06/2022   PLT 231 06/06/2022   NA 138 06/06/2022   K 4.1 06/06/2022   CL 108 06/06/2022   CO2 22 06/06/2022   GLUCOSE 73 06/06/2022   BUN 11 06/06/2022   CREATININE 0.98 06/06/2022   BILITOT 0.9 09/06/2021   ALKPHOS 150 (H) 09/06/2021   AST 17 09/06/2021   ALT 13 09/06/2021   PROT 7.1 09/06/2021   ALBUMIN 3.7 09/06/2021   CALCIUM 8.8 (L) 06/06/2022   GFRAA 81 10/14/2018    Speciality Comments: No specialty comments available.  Procedures:  Trigger Point Inj  Date/Time: 07/03/2022 2:35 PM  Performed by: Gearldine Bienenstock, PA-C Authorized by: Gearldine Bienenstock, PA-C   Consent Given by:  Patient Site marked: the procedure site was marked   Timeout: prior to procedure the correct patient, procedure, and site was verified   Indications:  Pain Total # of Trigger Points:  2 Location: neck   Needle Size:  27 G Approach:  Dorsal Medications #1:  0.5 mL lidocaine 1 %; 10 mg triamcinolone acetonide 40 MG/ML Medications #2:  0.5 mL lidocaine 1 %; 10 mg triamcinolone acetonide 40 MG/ML Patient tolerance:  Patient tolerated the procedure well with no immediate complications  Allergies: Lyrica [pregabalin], Belsomra [suvorexant], Morphine and codeine, Sulfamethoxazole-trimethoprim, and Tape   Assessment / Plan:     Visit Diagnoses: Fibromyalgia: She has generalized hyperalgesia and positive tender points on examination.  Patient presents today with trapezius muscle tension and tenderness bilaterally.  She had bilateral trapezius trigger point injections performed on  12/26/2021 which provided relief for several weeks but her symptoms have returned.  She requested repeat trigger point injections today.  She tolerated procedures well.  Procedure notes were completed above.  Aftercare was discussed.  She was advised to notify us if her symptoms persist or worsen.  She plans on continue to follow-up with pain management on a monthly basis.  Discussed the importance of regular exercise and good sleep hygiene.  Discussed that she may benefit from water aerobics/water therapy.  She is planning on increasing her walking regimen and plans on starting chair yoga. She will follow up in 6 months or sooner if needed.   Trapezius muscle spasm -She presents today with trapezius muscle tension tenderness bilaterally.  Bilateral trapezius trigger point injections were performed today in the office.  She tolerated procedures well.  Procedure notes were completed above.  Aftercare was discussed.  Plan: Trigger Point Inj  Primary osteoarthritis of both hands: She has PIP and DIP thickening consistent with osteoarthritis of both hands.  No tenderness or inflammation noted.  Complete fist formation bilaterally.  Discussed the importance of joint protection and muscle strengthening.  Trochanteric bursitis of both hips: Intermittent discomfort.  Discussed the importance of performing stretching exercises daily.  History of total bilateral knee replacement: Doing well.  Good range of motion with no discomfort. She plans on increasing her walking regimen and will be working on weight loss with dietary changes.   Primary osteoarthritis of both feet: Her feet pain  has been manageable.   DDD (degenerative disc disease), thoracic: Limited mobility.   DDD (degenerative disc disease), lumbar - Surgery by Dr. Otelia Sergeant in the past.  Limited mobility.  Followed by pain management.  She may benefit from water exercise.   Other medical conditions are listed as follows:  History of diabetes  mellitus  History of thrombophlebitis  History of sleep apnea  PFO (patent foramen ovale)  History of gastric bypass  History of cerebral infarction  Orders: Orders Placed This Encounter  Procedures   Trigger Point Inj   No orders of the defined types were placed in this encounter.     Follow-Up Instructions: Return in about 6 months (around 01/02/2023) for Fibromyalgia, Osteoarthritis.   Gearldine Bienenstock, PA-C  Note - This record has been created using Dragon software.  Chart creation errors have been sought, but may not always  have been located. Such creation errors do not reflect on  the standard of medical care.

## 2022-06-29 ENCOUNTER — Other Ambulatory Visit: Payer: Self-pay | Admitting: Psychiatry

## 2022-06-29 DIAGNOSIS — G43909 Migraine, unspecified, not intractable, without status migrainosus: Secondary | ICD-10-CM

## 2022-07-02 NOTE — Telephone Encounter (Signed)
Pt last seen on 12/19/21 per note "Continue Emgality for prevention " Follow up scheduled on 07/10/22 Last filled on 06/04/22

## 2022-07-03 ENCOUNTER — Ambulatory Visit: Payer: PPO | Attending: Physician Assistant | Admitting: Physician Assistant

## 2022-07-03 ENCOUNTER — Encounter: Payer: Self-pay | Admitting: Physician Assistant

## 2022-07-03 VITALS — BP 120/82 | HR 90 | Resp 17 | Ht 65.0 in | Wt 267.4 lb

## 2022-07-03 DIAGNOSIS — M62838 Other muscle spasm: Secondary | ICD-10-CM | POA: Diagnosis not present

## 2022-07-03 DIAGNOSIS — Z9884 Bariatric surgery status: Secondary | ICD-10-CM

## 2022-07-03 DIAGNOSIS — M19071 Primary osteoarthritis, right ankle and foot: Secondary | ICD-10-CM

## 2022-07-03 DIAGNOSIS — Z8672 Personal history of thrombophlebitis: Secondary | ICD-10-CM

## 2022-07-03 DIAGNOSIS — Z8639 Personal history of other endocrine, nutritional and metabolic disease: Secondary | ICD-10-CM | POA: Diagnosis not present

## 2022-07-03 DIAGNOSIS — M797 Fibromyalgia: Secondary | ICD-10-CM

## 2022-07-03 DIAGNOSIS — M19042 Primary osteoarthritis, left hand: Secondary | ICD-10-CM

## 2022-07-03 DIAGNOSIS — Z96653 Presence of artificial knee joint, bilateral: Secondary | ICD-10-CM

## 2022-07-03 DIAGNOSIS — Z8669 Personal history of other diseases of the nervous system and sense organs: Secondary | ICD-10-CM | POA: Diagnosis not present

## 2022-07-03 DIAGNOSIS — M51369 Other intervertebral disc degeneration, lumbar region without mention of lumbar back pain or lower extremity pain: Secondary | ICD-10-CM

## 2022-07-03 DIAGNOSIS — Q2112 Patent foramen ovale: Secondary | ICD-10-CM

## 2022-07-03 DIAGNOSIS — M5134 Other intervertebral disc degeneration, thoracic region: Secondary | ICD-10-CM

## 2022-07-03 DIAGNOSIS — M7061 Trochanteric bursitis, right hip: Secondary | ICD-10-CM

## 2022-07-03 DIAGNOSIS — M19072 Primary osteoarthritis, left ankle and foot: Secondary | ICD-10-CM

## 2022-07-03 DIAGNOSIS — Z8673 Personal history of transient ischemic attack (TIA), and cerebral infarction without residual deficits: Secondary | ICD-10-CM

## 2022-07-03 DIAGNOSIS — M5136 Other intervertebral disc degeneration, lumbar region: Secondary | ICD-10-CM

## 2022-07-03 DIAGNOSIS — M7062 Trochanteric bursitis, left hip: Secondary | ICD-10-CM

## 2022-07-03 DIAGNOSIS — M19041 Primary osteoarthritis, right hand: Secondary | ICD-10-CM

## 2022-07-03 MED ORDER — LIDOCAINE HCL 1 % IJ SOLN
0.5000 mL | INTRAMUSCULAR | Status: AC | PRN
Start: 2022-07-03 — End: 2022-07-03
  Administered 2022-07-03: .5 mL

## 2022-07-03 MED ORDER — TRIAMCINOLONE ACETONIDE 40 MG/ML IJ SUSP
10.0000 mg | INTRAMUSCULAR | Status: AC | PRN
Start: 2022-07-03 — End: 2022-07-03
  Administered 2022-07-03: 10 mg via INTRAMUSCULAR

## 2022-07-04 ENCOUNTER — Telehealth: Payer: Self-pay | Admitting: Gastroenterology

## 2022-07-04 ENCOUNTER — Other Ambulatory Visit: Payer: Self-pay | Admitting: Psychiatry

## 2022-07-04 NOTE — Telephone Encounter (Signed)
Patient made aware H pylori is negative and aware to make sure she follows up with [facility] for fistula. Patient states understanding.

## 2022-07-04 NOTE — Telephone Encounter (Signed)
Hi Crystal,  Can you please call the patient and tell the patient the stool testing was negative for H. pylori?  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

## 2022-07-09 ENCOUNTER — Encounter (INDEPENDENT_AMBULATORY_CARE_PROVIDER_SITE_OTHER): Payer: Self-pay

## 2022-07-10 ENCOUNTER — Ambulatory Visit: Payer: PPO | Admitting: Neurology

## 2022-07-10 ENCOUNTER — Ambulatory Visit (INDEPENDENT_AMBULATORY_CARE_PROVIDER_SITE_OTHER): Payer: PPO | Admitting: Gastroenterology

## 2022-07-10 ENCOUNTER — Encounter: Payer: Self-pay | Admitting: Gastroenterology

## 2022-07-10 ENCOUNTER — Encounter: Payer: Self-pay | Admitting: Neurology

## 2022-07-10 VITALS — BP 104/72 | HR 54 | Ht 65.0 in | Wt 269.5 lb

## 2022-07-10 VITALS — BP 114/75 | HR 77 | Temp 99.0°F | Ht 65.0 in | Wt 269.0 lb

## 2022-07-10 DIAGNOSIS — E7212 Methylenetetrahydrofolate reductase deficiency: Secondary | ICD-10-CM

## 2022-07-10 DIAGNOSIS — K219 Gastro-esophageal reflux disease without esophagitis: Secondary | ICD-10-CM | POA: Diagnosis not present

## 2022-07-10 DIAGNOSIS — R413 Other amnesia: Secondary | ICD-10-CM | POA: Diagnosis not present

## 2022-07-10 DIAGNOSIS — G43909 Migraine, unspecified, not intractable, without status migrainosus: Secondary | ICD-10-CM | POA: Diagnosis not present

## 2022-07-10 DIAGNOSIS — Z87898 Personal history of other specified conditions: Secondary | ICD-10-CM | POA: Diagnosis not present

## 2022-07-10 DIAGNOSIS — K5903 Drug induced constipation: Secondary | ICD-10-CM

## 2022-07-10 DIAGNOSIS — Z9884 Bariatric surgery status: Secondary | ICD-10-CM

## 2022-07-10 DIAGNOSIS — G43709 Chronic migraine without aura, not intractable, without status migrainosus: Secondary | ICD-10-CM

## 2022-07-10 DIAGNOSIS — I679 Cerebrovascular disease, unspecified: Secondary | ICD-10-CM

## 2022-07-10 DIAGNOSIS — K921 Melena: Secondary | ICD-10-CM

## 2022-07-10 DIAGNOSIS — R1012 Left upper quadrant pain: Secondary | ICD-10-CM

## 2022-07-10 MED ORDER — EMGALITY 120 MG/ML ~~LOC~~ SOSY: PREFILLED_SYRINGE | SUBCUTANEOUS | 11 refills | Status: DC

## 2022-07-10 MED ORDER — NURTEC 75 MG PO TBDP: ORAL_TABLET | ORAL | 11 refills | Status: DC

## 2022-07-10 MED ORDER — TOPIRAMATE 100 MG PO TABS: 100.0000 mg | ORAL_TABLET | Freq: Two times a day (BID) | ORAL | 1 refills | Status: DC

## 2022-07-10 MED ORDER — METAFOLBIC PLUS 6-2-600 MG PO TABS: 1.0000 | ORAL_TABLET | Freq: Every day | ORAL | 11 refills | Status: DC

## 2022-07-10 NOTE — Progress Notes (Signed)
Patient: Debbie Pineda Date of Birth: 07-Oct-1960  Reason for Visit: Follow up History from: Patient Primary Neurologist: Willis/Chima  ASSESSMENT AND PLAN 62 y.o. year old female   1.  Chronic migraine headaches -Under good control, continue Emgality for migraine prevention, continue Nurtec for migraine rescue  2.  Memory loss -Very good stability over time, today MoCA 28/30, we talked about consolidating her medication list, will try to wean off Aricept 5 mg daily for 1 month, if no worsening memory will try to stop, monitor  3.  Seizures -Well-controlled, continue Topamax 100 mg twice daily  4. 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    Dispense:  180 tablet    Refill:  1   Rimegepant Sulfate (NURTEC) 75 MG TBDP    Sig: DISSOLVE 1 TABLET BY MOUTH ONCE DAILY AS NEEDED, max is 1 tablet daily    Dispense:  8 tablet    Refill:  11   Galcanezumab-gnlm (EMGALITY) 120 MG/ML SOSY    Sig: INJECT 1 ML SUBCUTANEOUSLY  ONCE EVERY MONTH    Dispense:  1 mL    Refill:  11   Methylfol-Methylcob-Acetylcyst (METAFOLBIC PLUS) 6-2-600 MG TABS    Sig: Take 1 tablet by mouth daily.    Dispense:  30 tablet    Refill:  11   HISTORY OF PRESENT ILLNESS: Today 07/10/22 Here today, about 2 migraines a month, remains on Emgality. Tried sample of Ubrelvy, didn't notice any benefit over Nurtec. Nurtec works very well and quickly. Needs refills.   No seizures, still on Topamax 100 mg BID.  Memory is stable, on Aricept 10 mg, has hard time with word finding. Worsens when she is tired. MOCA is 28/30.  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    Sees pain MD for chronic back pain, Fibro. On Oxycodone, Nucynta.   Has been having dark stools, seeing GI. Had been taking Celebrex.   Foot pain is better, on gabapentin from pain doctor.   HISTORY  Brief HPI: 62 year old female with a history of hypothyroidism, MTHFR deficiency, remote CVA, seizure, migraines, fibromyalgia who follows in clinic for headaches, seizures, foot pain, and memory loss.    At her last visit, CTH was ordered. Emgality and Nurtec were continued for migraines. Topamax was continued for headache and seizures. Gabapentin was continued for foot pain. Donepezil was continued for memory loss.    Interval History: She continues to have headaches about 1 week before she is due for her Emgality injection.  Nurtec helps but does not last long.   Memory is doing well on donepezil. She feels like it has improved since her last visit.   Foot pain has improve since her lumbar fusion. She is also getting a shot in her foot tomorrow to see if this will help with her pain.   No new episodes concerning for seizure. Last episode concerning for seizure was 2 years ago.    [facility] 07/04/21 showed her old left occipital infarct with no acute process. She has not noticed any vision issues since her last visit.   Prior headache therapies: Prevention: Topamax 100 mg BID Cymbalta  60 mg BID Effexor Amitriptyline Gabapentin 800 mg TID Lyrica Aimovig Ajovy Emgality 120 mg monthly   Rescue: Lidocaine patches Tizanidine 4 mg PRN Nurtec 75 mg PRN Zofran 4 mg PRN  REVIEW OF SYSTEMS: Out of a complete 14 system review of symptoms, the patient complains only of the following symptoms, and all other reviewed systems are negative.  See HPI  ALLERGIES: Allergies  Allergen Reactions   Lyrica [Pregabalin] Shortness Of Breath and Swelling    lower extremity edema and weight gain   Belsomra [Suvorexant] Other (See Comments)    unknown   Morphine And Codeine Itching    Upper torso    Sulfamethoxazole-Trimethoprim Itching and Rash    Bactrim   Tape Itching and Rash    Please use "paper" tape Rash if left on longer than 24 hrs    HOME MEDICATIONS: Outpatient Medications Prior to Visit  Medication Sig Dispense Refill   baclofen (LIORESAL) 10 MG tablet Take 1 tablet (10 mg total) by mouth 3 (three) times daily. 90 each 0   Biotin 5000 MCG CAPS      Blood Glucose Monitoring Suppl (FREESTYLE PRECISION NEO SYSTEM) w/Device KIT USE 1 STRIP TO CHECK GLUCOSE 4 TIMES DAILY AND AS NEEDED     Cholecalciferol (VITAMIN D3) 125 MCG (5000 UT) CAPS      Cyanocobalamin (VITAMIN B-12) 5000 MCG SUBL      diclofenac sodium (VOLTAREN) 1 % GEL Apply 4 g topically 4 (four) times daily as needed (PAIN). 5 Tube 1   donepezil (ARICEPT) 10 MG tablet Take 1 tablet by mouth at bedtime. 90 tablet 2   DULoxetine (CYMBALTA) 60 MG capsule Take 60 mg by mouth 2 (two) times daily.     EMGALITY 120 MG/ML SOSY INJECT 1 ML SUBCUTANEOUSLY  ONCE EVERY MONTH 1 mL 0   famotidine (PEPCID) 40 MG tablet Take 1 tablet by mouth at bedtime. 90 tablet 2   ferrous gluconate (FERGON) 324 MG tablet Take 1 tablet (324 mg total) by mouth 2 (two) times daily with a meal. 60 tablet 0   folic acid (FOLVITE) 400 MCG tablet Take 400 mcg by mouth daily.     gabapentin (NEURONTIN) 800 MG tablet 800 mg. One tid     Krill Oil 350 MG CAPS Take 350 mg by mouth at bedtime.      levothyroxine (SYNTHROID) 125 MCG tablet Take 1 tablet (125 mcg total) by mouth daily before breakfast. 30 tablet 0   Methylfol-Methylcob-Acetylcyst (METAFOLBIC PLUS) 6-2-600 MG TABS Take 1 tablet by mouth daily. 30 tablet 3   methylphenidate (CONCERTA) 27 MG PO CR tablet Take 1 tablet (27 mg total) by mouth 2 (two) times daily. 10 am & 1400 60 tablet 0   metoCLOPramide (REGLAN) 5 MG tablet Take 1 tablet by mouth twice daily. 60 tablet 1   miconazole (ZEASORB-AF) 2 % powder Every shift as needed. Apply to red areas in folds of skin.     mirabegron ER  (MYRBETRIQ) 50 MG TB24 tablet Take 1 tablet (50 mg total) by mouth daily. 30 tablet 0   Multiple Minerals-Vitamins (CAL-MAG-ZINC-D PO) Take 3 tablets by mouth daily. 333 mg-133 unit -133 mg-5 mg     Multiple Vitamins-Minerals (ONE-A-DAY WOMENS PETITES) TABS Take 1 tablet by mouth 2 (two) times daily.     naloxegol oxalate (MOVANTIK) 25 MG TABS tablet TAKE (1) TABLET BY MOUTH ONCE DAILY. 30 tablet 3   NARCAN 4 MG/0.1ML LIQD nasal spray kit Place 0.1 sprays (0.4 mg total)  into the nose 2 (two) times daily as needed. 1 each 0   NUCYNTA ER 100 MG 12 hr tablet SMARTSIG:1 Tablet(s) By Mouth Every 12 Hours     NURTEC 75 MG TBDP DISSOLVE 1 TABLET BY MOUTH ONCE DAILY AS NEEDED 8 tablet 0   omeprazole (PRILOSEC) 40 MG capsule Take 1 capsule (40 mg total) by mouth 2 (two) times daily. 60 capsule 2   ondansetron (ZOFRAN-ODT) 4 MG disintegrating tablet Take 1 tablet (4 mg total) by mouth every 8 (eight) hours as needed for nausea or vomiting. 20 tablet 0   ONETOUCH ULTRA test strip      OneTouch UltraSoft 2 Lancets MISC      Oxycodone HCl 10 MG TABS Take 10 mg by mouth every 6 (six) hours as needed.     rivaroxaban (XARELTO) 20 MG TABS tablet Take 1 tablet (20 mg total) by mouth daily at 6 PM. 30 tablet 0   tiZANidine (ZANAFLEX) 4 MG tablet Take 1 tablet (4 mg total) by mouth every 6 (six) hours as needed for muscle spasms. 30 tablet 0   topiramate (TOPAMAX) 100 MG tablet Take 1 tablet (100 mg total) by mouth 2 (two) times daily. 180 tablet 1   traZODone (DESYREL) 100 MG tablet Take 0.5 tablets (50 mg total) by mouth at bedtime. 15 tablet 0   triamcinolone cream (KENALOG) 0.1 % SMARTSIG:1 Application Topical 2-3 Times Daily     trospium (SANCTURA) 20 MG tablet Take 1 tablet (20 mg total) by mouth 2 (two) times daily. 60 tablet 0   vitamin C (ASCORBIC ACID) 500 MG tablet Take 500 mg by mouth daily.     Ubrogepant (UBRELVY) 100 MG TABS Take 100 mg by mouth as needed. 2 tablet 0   No facility-administered  medications prior to visit.    PAST MEDICAL HISTORY: Past Medical History:  Diagnosis Date   ADD (attention deficit disorder)    Arthritis    "all over"   Cerebral infarction (HCC) 10/30/2011   Cerebrovascular disease 08/14/2016   Chronic back pain    "all over"   Chronic low back pain 01/11/2016   Complication of anesthesia    tends to have hypotension when NPO and post-anesthesia   Constipation    takes stool softener daily   Degenerative disk disease    Degenerative joint disease    DVT (deep venous thrombosis) (HCC) 2014   RLE   Family history of adverse reaction to anesthesia    a family member woke up during surgery; "think it was my mom"   Fibromyalgia    Generalized osteoarthritis of multiple sites 11/04/2013   GERD (gastroesophageal reflux disease)    Headache    Heart palpitations 12/14/2013   resolved   History of blood clots    superficial   Hypoglycemia    Hypothyroid    takes Synthroid daily   Incomplete emptying of bladder    Insomnia    takes Trazodone nightly   Iron deficiency anemia    takes Ferrous Sulfate daily   Joint pain    Joint swelling    knees and ankles   Memory disorder 08/14/2016   Morbid obesity (HCC)    Nausea    takes Zofran as needed.Seeing GI doc   Neck pain 05/25/2016   OSA on CPAP    tested more than 5 yrs ago.     Osteoarthritis    Osteopenia    in feet   PFO (patent foramen ovale)  no murmur   PFO (patent foramen ovale)    Pre-diabetes    Primary osteoarthritis of both feet 05/29/2016   Right bunionectomy August 2017 by Dr. Lajoyce Corners   PVC's (premature ventricular contractions) 10/11/2021   Monitor 9/23: 1.8% PVCs; rare PACs; o/w NSR   Scoliosis    Skin abnormality 02/04/2020   raised area on lip   Stroke [facility]) "several"last 2014   right foot weakness; memory issues, black spot right visual field since" (03/23/2015)   Thrombophlebitis    Trochanteric bursitis of both hips 05/25/2016   Unilateral primary  osteoarthritis, right knee 05/29/2016   Urinary urgency 04/19/2016   Vein disorder 11/29/2010   varicose veins both legs   Wears glasses     PAST SURGICAL HISTORY: Past Surgical History:  Procedure Laterality Date   BIOPSY  11/14/2018   Procedure: BIOPSY;  Surgeon: Malissa Hippo, MD;  [facility]: [facility];  Service: Endoscopy;;  esophagusgastric   BIOPSY  06/07/2022   Procedure: BIOPSY;  Surgeon: Dolores Frame, MD;  [facility]: [facility];  Service: Gastroenterology;;   BONE EXCISION Right 08/29/2017   Procedure: right trapezium excision;  Surgeon: Cindee Salt, MD;  Location: [facility];  Service: Orthopedics;  Laterality: Right;   BUNIONECTOMY Right 08/2015   CARDIAC CATHETERIZATION     2008.  "it was fine" (not sure why she had it done, and doesn't know where)   CARPOMETACARPEL SUSPENSION PLASTY Right 08/29/2017   Procedure: SUSPENSION PLASTY RIGHT THUMB;  Surgeon: Cindee Salt, MD;  Location: [facility];  Service: Orthopedics;  Laterality: Right;   COLONOSCOPY N/A 03/25/2013   Procedure: COLONOSCOPY;  Surgeon: Malissa Hippo, MD;  [facility];  Service: Endoscopy;  Laterality: N/A;  930   ERCP  06/02/2020   ESOPHAGOGASTRODUODENOSCOPY     ESOPHAGOGASTRODUODENOSCOPY (EGD) WITH PROPOFOL N/A 11/14/2018   Procedure: ESOPHAGOGASTRODUODENOSCOPY (EGD) WITH PROPOFOL;  Surgeon: Malissa Hippo, MD;  [facility];  Service: Endoscopy;  Laterality: N/A;  1:55pm-office moved to 11:00am/pt notified to arrive at 9:30am per KF   ESOPHAGOGASTRODUODENOSCOPY (EGD) WITH PROPOFOL N/A 06/07/2022   Procedure: ESOPHAGOGASTRODUODENOSCOPY (EGD) WITH PROPOFOL;  Surgeon: Dolores Frame, MD;  [facility];  Service: Gastroenterology;  Laterality: N/A;  2:00pm;asa 3   EXPLORATORY LAPAROTOMY     "took fallopian tubes out"   HALLUX FUSION Right 02/12/2020   Procedure: HALLUX INTERPHANGEAL JOINT  FUSION;  Surgeon: Candelaria Stagers,  DPM;  Location: [facility];  Service: Podiatry;  Laterality: Right;   HAMMER TOE SURGERY Right 02/12/2020   Procedure: SECOND AND THIRD HAMMER TOE CORRECTION; CAPSULOTOMY SECOND INTERPHALANGEAL JOINT;  Surgeon: Candelaria Stagers, DPM;  Location: [facility];  Service: Podiatry;  Laterality: Right;   JOINT REPLACEMENT     bil knee    KNEE ARTHROSCOPY Left    KNEE ARTHROSCOPY W/ ACL RECONSTRUCTION Right yrs ago   "added pins"   LAPAROSCOPIC CHOLECYSTECTOMY  ~ 2001   ROUX-EN-Y GASTRIC BYPASS  11/20/2010   [place]   SPINAL CORD STIMULATOR INSERTION N/A 04/18/2017   Procedure: LUMBAR SPINAL CORD STIMULATOR INSERTION;  Surgeon: Odette Fraction, MD;  Location: [facility];  Service: Neurosurgery;  Laterality: N/A;  LUMBAR SPINAL CORD STIMULATOR INSERTION   stomach stent  04/28/2020   TENDON TRANSFER Right 08/29/2017   Procedure: right abductor pollicis longus transfer;  Surgeon: Cindee Salt, MD;  Location: [facility];  Service: Orthopedics;  Laterality: Right;   TOTAL  KNEE ARTHROPLASTY Left 03/23/2015   Procedure: TOTAL KNEE ARTHROPLASTY;  Surgeon: Nadara Mustard, MD;  Location: [facility];  Service: Orthopedics;  Laterality: Left;   TOTAL KNEE ARTHROPLASTY Right 08/15/2016   Procedure: RIGHT TOTAL KNEE ARTHROPLASTY, REMOVAL ACL SCREWS;  Surgeon: Nadara Mustard, MD;  Location: [facility];  Service: Orthopedics;  Laterality: Right;   TOTAL KNEE ARTHROPLASTY WITH HARDWARE REMOVAL Right    VAGINAL HYSTERECTOMY     tah/bso   VARICOSE VEIN SURGERY Right X 2   WEIL OSTEOTOMY Right 02/12/2020   Procedure: DOUBLE L OSTEOTOMY;  Surgeon: Candelaria Stagers, DPM;  Location: [facility];  Service: Podiatry;  Laterality: Right;    FAMILY HISTORY: Family History  Problem Relation Age of Onset   Cancer Mother        skin and breast   Myasthenia gravis Mother    Breast cancer Mother    Heart disease Father    Cancer Father        brain   Parkinson's disease Father    Heart  disease Sister    Heart attack Sister    Heart disease Brother    Cancer Brother    Diabetes Brother    Stroke Brother    Cancer Maternal Grandfather    Hypothyroidism Daughter    Hypertension Other    Colon cancer Neg Hx     SOCIAL HISTORY: Social History   Socioeconomic History   Marital status: Married    Spouse name: Fayrene Fearing   Number of children: 1   Years of education: 14   Highest education level: Not on file  Occupational History   Occupation: Disability   Occupation: formerly Charity fundraiser, [employer]  Tobacco Use   Smoking status: Former    Packs/day: 0.75    Years: 8.00    Additional pack years: 0.00    Total pack years: 6.00    Types: Cigarettes    Quit date: 12/01/1990    Years since quitting: 31.6    Passive exposure: Past   Smokeless tobacco: Never  Vaping Use   Vaping Use: Never used  Substance and Sexual Activity   Alcohol use: No    Comment: 03/23/2015 "stopped drinking in 2012 w/gastric bypass; drank socially before bypass"   Drug use: No   Sexual activity: Not Currently    Birth control/protection: Surgical  Other Topics Concern   Not on file  Social History Narrative   Lives with husband   Caffeine use: No soda   Mainly water, drinks decaf tea   Right handed   Social Determinants of Health   Financial Resource Strain: Not on file  Food Insecurity: Not on file  Transportation Needs: Not on file  Physical Activity: Not on file  Stress: Not on file  Social Connections: Not on file  Intimate Partner Violence: Not on file    PHYSICAL EXAM  Vitals:   07/10/22 1452  BP: 104/72  Pulse: (!) 54  Weight: 269 lb 8 oz (122.2 kg)  Height: 5\' 5"  (1.651 m)   Body mass index is 44.85 kg/m.    07/10/2022    3:36 PM 06/29/2021    9:36 AM  Montreal Cognitive Assessment   Visuospatial/ Executive (0/5) 5 5  Naming (0/3) 3 3  Attention: Read list of digits (0/2) 1 2  Attention: Read list of letters (0/1) 1 1  Attention: Serial 7 subtraction starting at 100 (0/3) 3  2  Language: Repeat phrase (0/2) 2 1  Language : Fluency (  0/1) 0 1  Abstraction (0/2) 2 2  Delayed Recall (0/5) 5 3  Orientation (0/6) 6 6  Total 28 26   Generalized: Well developed, in no acute distress  Neurological examination  Mentation: Alert oriented to time, place, history taking. Follows all commands speech and language fluent Cranial nerve II-XII: Pupils were equal round reactive to light. Extraocular movements were full,. Facial sensation and strength were normal.  Head turning and shoulder shrug  were normal and symmetric. Motor: The motor testing reveals 5 over 5 strength of all 4 extremities. Good symmetric motor tone is noted throughout.  Sensory: Sensory testing is intact to soft touch on all 4 extremities. No evidence of extinction is noted.  Coordination: Cerebellar testing reveals good finger-nose-finger and heel-to-shin bilaterally.  Gait and station: Gait is normal, cautious, antalgic, has single-point cane Reflexes: Deep tendon reflexes are symmetric but decreased  DIAGNOSTIC DATA (LABS, IMAGING, TESTING) - I reviewed patient records, labs, notes, testing and imaging myself where available.  Lab Results  Component Value Date   WBC 7.0 06/06/2022   HGB 12.9 06/06/2022   HCT 39.4 06/06/2022   MCV 98.3 06/06/2022   PLT 231 06/06/2022      Component Value Date/Time   NA 138 06/06/2022 1317   NA 143 10/11/2021 1105   K 4.1 06/06/2022 1317   CL 108 06/06/2022 1317   CO2 22 06/06/2022 1317   GLUCOSE 73 06/06/2022 1317   BUN 11 06/06/2022 1317   BUN 11 10/11/2021 1105   CREATININE 0.98 06/06/2022 1317   CREATININE 0.92 10/14/2018 1302   CALCIUM 8.8 (L) 06/06/2022 1317   PROT 7.1 09/06/2021 1119   ALBUMIN 3.7 09/06/2021 1119   AST 17 09/06/2021 1119   ALT 13 09/06/2021 1119   ALKPHOS 150 (H) 09/06/2021 1119   BILITOT 0.9 09/06/2021 1119   GFRNONAA >60 06/06/2022 1317   GFRNONAA 70 10/14/2018 1302   GFRAA 81 10/14/2018 1302   Lab Results  Component  Value Date   CHOL 151 05/02/2017   HDL 74 05/02/2017   LDLCALC 66 05/02/2017   TRIG 54 05/02/2017   CHOLHDL 2.0 05/02/2017   Lab Results  Component Value Date   HGBA1C 5.0 05/02/2017   Lab Results  Component Value Date   VITAMINB12 1,582 (H) 08/14/2016   Lab Results  Component Value Date   TSH 3.700 10/11/2021    Margie Ege, AGNP-C, DNP 07/10/2022, 3:13 PM [facility] [address] [place], [place] [address] [phone]

## 2022-07-10 NOTE — Progress Notes (Addendum)
GI Office Note    Referring Provider: Avis Epley, [place]* Primary Care Physician:  Ladon Applebaum Primary Gastroenterologist: Dolores Frame, MD  Date:  07/10/2022  ID:  Debbie Pineda, DOB 05/04/1960, MRN [id number]   Chief Complaint   Chief Complaint  Patient presents with   Melena    Follow up on melena. Still having dark stools at times. Still has to take celebrex for fibromyalgia pain.    History of Present Illness  Debbie LEHMKUHL is a 62 y.o. female with a history of CVA, previous Roux-en-Y gastric bypass (Dr. 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  ERCP: 06/02/20: No specimens collected.  - Gastric bypass. Gastrojejunal anastomosis characterized by healthy appearing mucosa. - Pre-existing gastrogastrostomy AXIOS stent. Removed and replaced with two 10Fr x 4cm double pigtail stents to maintain patency (given the anticipated need for  ampullary surveillance in the future). - A single ampullary polyp (adenomatous) Snare papillectomy of the  major papilla was performed. Resected and retrieved  with hemostasis achieved through the use of snare tip soft coagulation. - The entire main bile duct was mildly dilated. acquired. - A biliary sphincterotomy was performed.  - The biliary tree was swept and sludge was found. (Recommended repeat EGD in 6-12 months, pending path)                     Upper Endoscopy:04/28/20  (EUS) The examined esophagus was endoscopically normal. Evidence of a gastric bypass was found. A gastric pouch with a small size was found. The staple line appeared intact. The gastrojejunal anastomosis was characterized by healthy appearing mucosa. This was traversed. The pouch-to-jejunum limb was characterized by healthy  appearing mucosa. The examined jejunum was normal.   Last  Colonoscopy: march 2015, normal, small eternal hemorrhoids repeat in 10 years   CT A/P with contrast 09/06/21 Prominent stool throughout the colon favors constipation. 2. Other imaging findings of potential clinical significance: Small right renal angiomyolipoma. Pneumobilia. Prior cholecystectomy. Prior gastric bypass. Extensive postoperative findings in the thoracolumbar spine.  Seen in December 2023 and at that time was doing well Movantik with at least 1 bowel movement per day.  Having rare abdominal pain.  Denied any rectal bleeding or melena.  Maintained on Meprazole 40 mg twice daily and famotidine 40 g nightly.  Has occasional GERD symptoms once per week usually related to dietary indiscretion.  Occasional nausea but only if she eats something very sweet.  Sometimes notes gas bubbles that occur with nausea.  She was doing Reglan twice daily, occasionally 3 times daily with good results.  She was previously recommended to continue with PPI twice daily, Movantik, Reglan twice daily, follow-up with [facility] for repeat EGD.  EGD in November 2023 with [facility] due to gastrocolic fistula.  Findings noted below.  She was recommended to follow-up with them to have repeat EGD in 6 months however patient previously reported she was unaware to follow-up with them.  EGD: 12/06/21   - Roux-en-y gastric bypass with dilated GJ anastamosis  (>49mm), several surgical staples removed.  - Persistent GG Fistula s/p successful partial closure using APC and endoscopic suturing  - Evidence of prior Ampullotomy without residual  adenomatous changes  - Otherwise normal remnant stomach and duodenum   Last office visit 05/15/2022.  Presented with complaints of melena for last few months.  Noted to  be on Celebrex chronically for fibromyalgia.  She has stopped Celebrex intermittently and noticed her melena to stop.  Appetite is good.  Eating does not make pain better or worse to the left upper quadrant.  She has chronic nausea  which is intermittent and has a lot of gas after eating especially with certain foods such as meat.  Laying down on her side after meals tends to help relieve gas.  Hemoglobin was 14 the week prior.  She was started on Carafate 1 g 4 times daily.  Noted close unable to follow-up with [facility] for repeat EGD due to back surgery.  Continued on Movantik for constipation and stooling 3 Colace nightly and having a BM daily to every other day.  Per discussion with Dr. Levon Hedger he recommended a follow-up EGD with [place] and to also reach out to [facility] to have repeat EGD for gastrocolic fistula thereafter.  Labs 06/06/2022: Hemoglobin 12.9, BMP unremarkable.  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 -Advised to follow-up with [facility] regarding gastrogastric fistula -If negative biopsies advised to proceed with H. pylori stool kit testing -Pathology with reactive gastropathy with minimal chronic gastritis, negative for H. pylori or intestinal metaplasia.   H. pylori stool testing was negative for H. pylori.  Levon Hedger advised her to follow-up with [facility] regarding her gastrogastric fistula.  Today:  Still has to take her Celebrex from time to time. Takes it about every other day. Follows with pain management and that is helping. She has chronic back pain and back surgery.  All of her lumbar spine has been fused with rods. With her fibromyalgia she is on gabapentin and it helps the joint and muscle pain. Sometimes the melena is everyday and sometimes it skips a few days. Denies any bright red blood.  Has been trying to work on getting in at [facility] to have repeat EGD regarding her gastrogastric fistula. There is a mix up on the necessary orders and is waiting on a call back regarding that.   Not having any  nausea,vomiting. Appetite is good. Since the initial procedure she has gained some weight.  Previously was having some LUQ pain but has not had any recently. Is a little tender with pressing on it but not having any issues with post prandial pain. PPI BID, famotidine with supper. Takes the Reglan twice daily. Still on Xarelto. Takes iron once daily.    Current Outpatient Medications  Medication Sig Dispense Refill   baclofen (LIORESAL) 10 MG tablet Take 1 tablet (10 mg total) by mouth 3 (three) times daily. 90 each 0   Biotin 5000 MCG CAPS      Blood Glucose Monitoring Suppl (FREESTYLE PRECISION NEO SYSTEM) w/Device KIT USE 1 STRIP TO CHECK GLUCOSE 4 TIMES DAILY AND AS NEEDED     Cholecalciferol (VITAMIN D3) 125 MCG (5000 UT) CAPS      Cyanocobalamin (VITAMIN B-12) 5000 MCG SUBL      diclofenac sodium (VOLTAREN) 1 % GEL Apply 4 g topically 4 (four) times daily as needed (PAIN). 5 Tube 1   donepezil (ARICEPT) 10 MG tablet Take 1 tablet by mouth at bedtime. 90 tablet 2   DULoxetine (CYMBALTA) 60 MG capsule Take 60 mg by mouth 2 (two) times daily.     EMGALITY 120 MG/ML SOSY INJECT 1 ML SUBCUTANEOUSLY  ONCE EVERY MONTH 1 mL 0  famotidine (PEPCID) 40 MG tablet Take 1 tablet by mouth at bedtime. 90 tablet 2   ferrous gluconate (FERGON) 324 MG tablet Take 1 tablet (324 mg total) by mouth 2 (two) times daily with a meal. 60 tablet 0   folic acid (FOLVITE) 400 MCG tablet Take 400 mcg by mouth daily.     gabapentin (NEURONTIN) 800 MG tablet 800 mg. One tid     Krill Oil 350 MG CAPS Take 350 mg by mouth at bedtime.      levothyroxine (SYNTHROID) 125 MCG tablet Take 1 tablet (125 mcg total) by mouth daily before breakfast. 30 tablet 0   Methylfol-Methylcob-Acetylcyst (METAFOLBIC PLUS) 6-2-600 MG TABS Take 1 tablet by mouth daily. 30 tablet 3   methylphenidate (CONCERTA) 27 MG PO CR tablet Take 1 tablet (27 mg total) by mouth 2 (two) times daily. 10 am & 1400 60 tablet 0   metoCLOPramide (REGLAN) 5 MG  tablet Take 1 tablet by mouth twice daily. 60 tablet 1   miconazole (ZEASORB-AF) 2 % powder Every shift as needed. Apply to red areas in folds of skin.     mirabegron ER (MYRBETRIQ) 50 MG TB24 tablet Take 1 tablet (50 mg total) by mouth daily. 30 tablet 0   Multiple Minerals-Vitamins (CAL-MAG-ZINC-D PO) Take 3 tablets by mouth daily. 333 mg-133 unit -133 mg-5 mg     Multiple Vitamins-Minerals (ONE-A-DAY WOMENS PETITES) TABS      naloxegol oxalate (MOVANTIK) 25 MG TABS tablet TAKE (1) TABLET BY MOUTH ONCE DAILY. 30 tablet 3   NARCAN 4 MG/0.1ML LIQD nasal spray kit Place 0.1 sprays (0.4 mg total) into the nose 2 (two) times daily as needed. 1 each 0   NUCYNTA ER 100 MG 12 hr tablet SMARTSIG:1 Tablet(s) By Mouth Every 12 Hours     NURTEC 75 MG TBDP DISSOLVE 1 TABLET BY MOUTH ONCE DAILY AS NEEDED 8 tablet 0   omeprazole (PRILOSEC) 40 MG capsule Take 1 capsule (40 mg total) by mouth 2 (two) times daily. 60 capsule 2   ondansetron (ZOFRAN-ODT) 4 MG disintegrating tablet Take 1 tablet (4 mg total) by mouth every 8 (eight) hours as needed for nausea or vomiting. 20 tablet 0   ONETOUCH ULTRA test strip      OneTouch UltraSoft 2 Lancets MISC      Oxycodone HCl 10 MG TABS Take 10 mg by mouth every 6 (six) hours as needed.     rivaroxaban (XARELTO) 20 MG TABS tablet Take 1 tablet (20 mg total) by mouth daily at 6 PM. 30 tablet 0   tiZANidine (ZANAFLEX) 4 MG tablet Take 1 tablet (4 mg total) by mouth every 6 (six) hours as needed for muscle spasms. 30 tablet 0   topiramate (TOPAMAX) 100 MG tablet Take 1 tablet (100 mg total) by mouth 2 (two) times daily. 180 tablet 1   traZODone (DESYREL) 100 MG tablet Take 0.5 tablets (50 mg total) by mouth at bedtime. 15 tablet 0   triamcinolone cream (KENALOG) 0.1 % SMARTSIG:1 Application Topical 2-3 Times Daily     trospium (SANCTURA) 20 MG tablet Take 1 tablet (20 mg total) by mouth 2 (two) times daily. 60 tablet 0   Ubrogepant (UBRELVY) 100 MG TABS Take 100 mg by mouth  as needed. 2 tablet 0   vitamin C (ASCORBIC ACID) 500 MG tablet Take 500 mg by mouth daily.     No current facility-administered medications for this visit.    Past Medical History:  Diagnosis Date  ADD (attention deficit disorder)    Arthritis    "all over"   Cerebral infarction (HCC) 10/30/2011   Cerebrovascular disease 08/14/2016   Chronic back pain    "all over"   Chronic low back pain 01/11/2016   Complication of anesthesia    tends to have hypotension when NPO and post-anesthesia   Constipation    takes stool softener daily   Degenerative disk disease    Degenerative joint disease    DVT (deep venous thrombosis) (HCC) 2014   RLE   Family history of adverse reaction to anesthesia    a family member woke up during surgery; "think it was my mom"   Fibromyalgia    Generalized osteoarthritis of multiple sites 11/04/2013   GERD (gastroesophageal reflux disease)    Headache    Heart palpitations 12/14/2013   resolved   History of blood clots    superficial   Hypoglycemia    Hypothyroid    takes Synthroid daily   Incomplete emptying of bladder    Insomnia    takes Trazodone nightly   Iron deficiency anemia    takes Ferrous Sulfate daily   Joint pain    Joint swelling    knees and ankles   Memory disorder 08/14/2016   Morbid obesity (HCC)    Nausea    takes Zofran as needed.Seeing GI doc   Neck pain 05/25/2016   OSA on CPAP    tested more than 5 yrs ago.     Osteoarthritis    Osteopenia    in feet   PFO (patent foramen ovale)    no murmur   PFO (patent foramen ovale)    Pre-diabetes    Primary osteoarthritis of both feet 05/29/2016   Right bunionectomy August 2017 by Dr. Lajoyce Corners   PVC's (premature ventricular contractions) 10/11/2021   Monitor 9/23: 1.8% PVCs; rare PACs; o/w NSR   Scoliosis    Skin abnormality 02/04/2020   raised area on lip   Stroke [facility]) "several"last 2014   right foot weakness; memory issues, black spot right visual field since"  (03/23/2015)   Thrombophlebitis    Trochanteric bursitis of both hips 05/25/2016   Unilateral primary osteoarthritis, right knee 05/29/2016   Urinary urgency 04/19/2016   Vein disorder 11/29/2010   varicose veins both legs   Wears glasses     Past Surgical History:  Procedure Laterality Date   BIOPSY  11/14/2018   Procedure: BIOPSY;  Surgeon: Malissa Hippo, MD;  [facility];  Service: Endoscopy;;  esophagusgastric   BIOPSY  06/07/2022   Procedure: BIOPSY;  Surgeon: Dolores Frame, MD;  [facility];  Service: Gastroenterology;;   BONE EXCISION Right 08/29/2017   Procedure: right trapezium excision;  Surgeon: Cindee Salt, MD;  Location: [facility];  Service: Orthopedics;  Laterality: Right;   BUNIONECTOMY Right 08/2015   CARDIAC CATHETERIZATION     2008.  "it was fine" (not sure why she had it done, and doesn't know where)   CARPOMETACARPEL SUSPENSION PLASTY Right 08/29/2017   Procedure: SUSPENSION PLASTY RIGHT THUMB;  Surgeon: Cindee Salt, MD;  Location: [facility];  Service: Orthopedics;  Laterality: Right;   COLONOSCOPY N/A 03/25/2013   Procedure: COLONOSCOPY;  Surgeon: Malissa Hippo, MD;  [facility];  Service: Endoscopy;  Laterality: N/A;  930   ERCP  06/02/2020   ESOPHAGOGASTRODUODENOSCOPY     ESOPHAGOGASTRODUODENOSCOPY (EGD) WITH PROPOFOL N/A 11/14/2018   Procedure: ESOPHAGOGASTRODUODENOSCOPY (EGD) WITH PROPOFOL;  Surgeon: Malissa Hippo, MD;  [facility];  Service: Endoscopy;  Laterality: N/A;  1:55pm-office moved to 11:00am/pt notified to arrive at 9:30am per KF   ESOPHAGOGASTRODUODENOSCOPY (EGD) WITH PROPOFOL N/A 06/07/2022   Procedure: ESOPHAGOGASTRODUODENOSCOPY (EGD) WITH PROPOFOL;  Surgeon: Dolores Frame, MD;  [facility];  Service: Gastroenterology;  Laterality: N/A;  2:00pm;asa 3   EXPLORATORY LAPAROTOMY     "took fallopian tubes out"   HALLUX FUSION Right  02/12/2020   Procedure: HALLUX INTERPHANGEAL JOINT  FUSION;  Surgeon: Candelaria Stagers, DPM;  Location: [facility];  Service: Podiatry;  Laterality: Right;   HAMMER TOE SURGERY Right 02/12/2020   Procedure: SECOND AND THIRD HAMMER TOE CORRECTION; CAPSULOTOMY SECOND INTERPHALANGEAL JOINT;  Surgeon: Candelaria Stagers, DPM;  Location: [facility];  Service: Podiatry;  Laterality: Right;   JOINT REPLACEMENT     bil knee    KNEE ARTHROSCOPY Left    KNEE ARTHROSCOPY W/ ACL RECONSTRUCTION Right yrs ago   "added pins"   LAPAROSCOPIC CHOLECYSTECTOMY  ~ 2001   ROUX-EN-Y GASTRIC BYPASS  11/20/2010   [place]   SPINAL CORD STIMULATOR INSERTION N/A 04/18/2017   Procedure: LUMBAR SPINAL CORD STIMULATOR INSERTION;  Surgeon: Odette Fraction, MD;  Location: [facility];  Service: Neurosurgery;  Laterality: N/A;  LUMBAR SPINAL CORD STIMULATOR INSERTION   stomach stent  04/28/2020   TENDON TRANSFER Right 08/29/2017   Procedure: right abductor pollicis longus transfer;  Surgeon: Cindee Salt, MD;  Location: [facility];  Service: Orthopedics;  Laterality: Right;   TOTAL KNEE ARTHROPLASTY Left 03/23/2015   Procedure: TOTAL KNEE ARTHROPLASTY;  Surgeon: Nadara Mustard, MD;  Location: [facility];  Service: Orthopedics;  Laterality: Left;   TOTAL KNEE ARTHROPLASTY Right 08/15/2016   Procedure: RIGHT TOTAL KNEE ARTHROPLASTY, REMOVAL ACL SCREWS;  Surgeon: Nadara Mustard, MD;  Location: [facility];  Service: Orthopedics;  Laterality: Right;   TOTAL KNEE ARTHROPLASTY WITH HARDWARE REMOVAL Right    VAGINAL HYSTERECTOMY     tah/bso   VARICOSE VEIN SURGERY Right X 2   WEIL OSTEOTOMY Right 02/12/2020   Procedure: DOUBLE L OSTEOTOMY;  Surgeon: Candelaria Stagers, DPM;  Location: [facility];  Service: Podiatry;  Laterality: Right;    Family History  Problem Relation Age of Onset   Cancer Mother        skin and breast   Myasthenia gravis Mother    Breast cancer Mother    Heart disease Father     Cancer Father        brain   Parkinson's disease Father    Heart disease Sister    Heart attack Sister    Heart disease Brother    Cancer Brother    Diabetes Brother    Stroke Brother    Cancer Maternal Grandfather    Hypothyroidism Daughter    Hypertension Other    Colon cancer Neg Hx     Allergies as of 07/10/2022 - Review Complete 07/10/2022  Allergen Reaction Noted   Lyrica [pregabalin] Shortness Of Breath and Swelling 12/01/2015   Belsomra [suvorexant] Other (See Comments) 12/01/2015   Morphine and codeine Itching 10/26/2010   Sulfamethoxazole-trimethoprim Itching and Rash 03/10/2015   Tape Itching and Rash 03/10/2015    Social History   Socioeconomic History   Marital status: Married    Spouse name: Fayrene Fearing   Number of children: 1   Years of education: 14   Highest education level: Not on file  Occupational History   Occupation: Disability   Occupation: formerly Charity fundraiser, [employer]  Tobacco Use   Smoking status: Former    Packs/day: 0.75    Years: 8.00    Additional pack years: 0.00    Total pack years: 6.00    Types: Cigarettes    Quit date: 12/01/1990    Years since quitting: 31.6    Passive exposure: Past   Smokeless tobacco: Never  Vaping Use   Vaping Use: Never used  Substance and Sexual Activity   Alcohol use: No    Comment: 03/23/2015 "stopped drinking in 2012 w/gastric bypass; drank socially before bypass"   Drug use: No   Sexual activity: Not Currently    Birth control/protection: Surgical  Other Topics Concern   Not on file  Social History Narrative   Lives with husband   Caffeine use: No soda   Mainly water, drinks decaf tea   Right handed   Social Determinants of Health   Financial Resource Strain: Not on file  Food Insecurity: Not on file  Transportation Needs: Not on file  Physical Activity: Not on file  Stress: Not on file  Social Connections: Not on file     Review of Systems   Gen: + fatigue. Denies fever, chills, anorexia. Denies   weakness, weight loss.  CV: Denies chest pain, palpitations, syncope, peripheral edema, and claudication. Resp: Denies dyspnea at rest, cough, wheezing, coughing up blood, and pleurisy. GI: See HPI Derm: Denies rash, itching, dry skin Psych: Denies depression, anxiety, memory loss, confusion. No homicidal or suicidal ideation.  Heme: Denies bruising, bleeding, and enlarged lymph nodes. + Joint pain and myalgias.  Physical Exam   BP 114/75 (BP Location: Left Arm, Patient Position: Sitting, Cuff Size: Large)   Pulse 77   Temp 99 F (37.2 C) (Oral)   Ht 5\' 5"  (1.651 m)   Wt 269 lb (122 kg)   BMI 44.76 kg/m   General:   Alert and oriented. No distress noted. Pleasant and cooperative.  Head:  Normocephalic and atraumatic. Eyes:  Conjuctiva clear without scleral icterus. Mouth:  Oral mucosa pink and moist. Good dentition. No lesions. Lungs:  Clear to auscultation bilaterally. No wheezes, rales, or rhonchi. No distress.  Heart:  S1, S2 present without murmurs appreciated.  Abdomen:  +BS, soft,  non-distended.  Mild TTP to LUQ.  No rebound or guarding. No HSM or masses noted. Rectal: deferred  Msk:  Symmetrical without gross deformities. Normal posture. Extremities:  Without edema. Neurologic:  Alert and  oriented x4 Psych:  Alert and cooperative. Normal mood and affect.  Assessment  Debbie Pineda is a 62 y.o. female with a history of CVA, previous Roux-en-Y gastric bypass (Dr. 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  Melena, LUQ pain, GERD: Having intermittent melena.  Continues to take Celebrex about every other day regarding her fibromyalgia and joint pain.  She continues to follow with pain management she states this seems to be the only thing that helps her pain therefore she has continued.  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  Her melena is likely in the setting of losing secondary to ongoing NSAID use as well as being on chronic blood thinner.  Reassuringly most recent hemoglobin is stable at 12.9.  She continues on daily iron therapy.  She is no longer having any left upper quadrant pain however she does have some mild tenderness to one specific spot in the left upper quadrant.  She is following up with [facility] regarding another repeat upper endoscopy in regards to her gastrogastric fistula.  She continues to take PPI twice daily, famotidine in the evenings, and Reglan twice daily and symptoms appear to be well-controlled.  Constipation: Doing fairly well on Movantik and 3-4 Colace daily.  She tries to avoid straining.  Denies any abdominal pain today.  PLAN   Continue with PPI BID and famotidine 40 mg in the evening Continue Reglan BID Continue daily iron therapy Continue Movantik Continue stool softeners daily. Continue follow-up with [facility] for EGD regarding gastrogastric fistula Reinforced to avoid all NSAIDs including Celebrex, need to discuss other alternatives with pain management Due for colonoscopy March 2025 Follow-up in 3 months    Brooke Bonito, MSN, FNP-BC, AGACNP-BC [facility]  I have reviewed the note and agree with the APP's assessment as described in this progress note  Katrinka Blazing, MD Gastroenterology and Hepatology [facility]

## 2022-07-10 NOTE — Patient Instructions (Signed)
Cut back the Aricept to 5 mg daily for 1 month, if no change in memory, stop it. Monitor memory  Keep other medications the same  Meds ordered this encounter  Medications   topiramate (TOPAMAX) 100 MG tablet    Sig: Take 1 tablet (100 mg total) by mouth 2 (two) times daily.    Dispense:  180 tablet    Refill:  1   Rimegepant Sulfate (NURTEC) 75 MG TBDP    Sig: DISSOLVE 1 TABLET BY MOUTH ONCE DAILY AS NEEDED, max is 1 tablet daily    Dispense:  8 tablet    Refill:  11   Galcanezumab-gnlm (EMGALITY) 120 MG/ML SOSY    Sig: INJECT 1 ML SUBCUTANEOUSLY  ONCE EVERY MONTH    Dispense:  1 mL    Refill:  11   Methylfol-Methylcob-Acetylcyst (METAFOLBIC PLUS) 6-2-600 MG TABS    Sig: Take 1 tablet by mouth daily.    Dispense:  30 tablet    Refill:  11

## 2022-07-10 NOTE — Patient Instructions (Addendum)
Refills for Reglan, omeprazole, and famotidine were recently sent in by Doylene Bode, NP for you.  You should have 90-day supplies of these.  As we discussed, it is good to avoid all NSAIDs including Celebrex, Mobic, and meloxicam as this can make gastritis worse which was identified on numerous recent upper endoscopy.  Please continue to follow-up with [facility] in order to get your repeat upper endoscopy scheduled regarding your gastrogastric fistula.  Continue to take your Movantik and stool softeners daily.  Ensure you are staying good.  Is a pleasure to meet you today!  I want to create trusting relationships with patients. If you receive a survey regarding your visit,  I greatly appreciate you taking time to fill this out on paper or through your MyChart. I value your feedback.  Brooke Bonito, MSN, FNP-BC, AGACNP-BC [facility]

## 2022-07-13 ENCOUNTER — Encounter: Payer: Self-pay | Admitting: Gastroenterology

## 2022-07-17 DIAGNOSIS — G894 Chronic pain syndrome: Secondary | ICD-10-CM | POA: Diagnosis not present

## 2022-07-17 DIAGNOSIS — M47816 Spondylosis without myelopathy or radiculopathy, lumbar region: Secondary | ICD-10-CM | POA: Diagnosis not present

## 2022-07-17 DIAGNOSIS — M5416 Radiculopathy, lumbar region: Secondary | ICD-10-CM | POA: Diagnosis not present

## 2022-07-17 DIAGNOSIS — M79671 Pain in right foot: Secondary | ICD-10-CM | POA: Diagnosis not present

## 2022-07-20 ENCOUNTER — Other Ambulatory Visit (HOSPITAL_COMMUNITY): Payer: Self-pay

## 2022-07-22 DIAGNOSIS — E063 Autoimmune thyroiditis: Secondary | ICD-10-CM | POA: Diagnosis not present

## 2022-07-22 DIAGNOSIS — E782 Mixed hyperlipidemia: Secondary | ICD-10-CM | POA: Diagnosis not present

## 2022-07-22 DIAGNOSIS — M797 Fibromyalgia: Secondary | ICD-10-CM | POA: Diagnosis not present

## 2022-08-03 DIAGNOSIS — G4733 Obstructive sleep apnea (adult) (pediatric): Secondary | ICD-10-CM | POA: Diagnosis not present

## 2022-08-10 DIAGNOSIS — Z6841 Body Mass Index (BMI) 40.0 and over, adult: Secondary | ICD-10-CM | POA: Diagnosis not present

## 2022-08-10 DIAGNOSIS — R7309 Other abnormal glucose: Secondary | ICD-10-CM | POA: Diagnosis not present

## 2022-08-10 DIAGNOSIS — F902 Attention-deficit hyperactivity disorder, combined type: Secondary | ICD-10-CM | POA: Diagnosis not present

## 2022-08-10 DIAGNOSIS — I10 Essential (primary) hypertension: Secondary | ICD-10-CM | POA: Diagnosis not present

## 2022-08-10 DIAGNOSIS — R32 Unspecified urinary incontinence: Secondary | ICD-10-CM | POA: Diagnosis not present

## 2022-08-10 DIAGNOSIS — E782 Mixed hyperlipidemia: Secondary | ICD-10-CM | POA: Diagnosis not present

## 2022-08-14 DIAGNOSIS — M5416 Radiculopathy, lumbar region: Secondary | ICD-10-CM | POA: Diagnosis not present

## 2022-08-14 DIAGNOSIS — M47816 Spondylosis without myelopathy or radiculopathy, lumbar region: Secondary | ICD-10-CM | POA: Diagnosis not present

## 2022-08-14 DIAGNOSIS — M79671 Pain in right foot: Secondary | ICD-10-CM | POA: Diagnosis not present

## 2022-08-14 DIAGNOSIS — G894 Chronic pain syndrome: Secondary | ICD-10-CM | POA: Diagnosis not present

## 2022-08-21 ENCOUNTER — Other Ambulatory Visit (INDEPENDENT_AMBULATORY_CARE_PROVIDER_SITE_OTHER): Payer: Self-pay | Admitting: Gastroenterology

## 2022-08-21 DIAGNOSIS — Z9884 Bariatric surgery status: Secondary | ICD-10-CM

## 2022-08-21 DIAGNOSIS — K219 Gastro-esophageal reflux disease without esophagitis: Secondary | ICD-10-CM

## 2022-08-21 NOTE — Telephone Encounter (Signed)
Last seen by Toni Amend on 07/10/22 Note states to continue reglan bid and f/u in 3 months.

## 2022-09-05 ENCOUNTER — Other Ambulatory Visit (INDEPENDENT_AMBULATORY_CARE_PROVIDER_SITE_OTHER): Payer: PPO

## 2022-09-05 ENCOUNTER — Ambulatory Visit: Payer: PPO | Admitting: Orthopedic Surgery

## 2022-09-05 DIAGNOSIS — Z981 Arthrodesis status: Secondary | ICD-10-CM

## 2022-09-05 NOTE — Progress Notes (Signed)
Orthopedic Spine Surgery Office Note   Assessment: Patient is a 62 y.o. female who underwent T10-S1 PSIF and multi-level TLIF with my partner on 07/2021 who feels she got symptomatic relief with surgery. Not having any radiating leg pain. Back pain is tolerable. Her bigger issue is right SI joint pain     Plan: -Patient has tried activity modification, pain management, right SI joint injections -Explained that she would be a candidate for SI fusion if she loses weight since she has had 2 injections with greater than 70% relief and physical exam findings consistent with SI pain.  Her injections are lasting a couple of month so she wants to continue with that treatment.  Referral to Dr. Shon Baton for repeat injection -In regards to her spinal fusion, she has now 1 year out from surgery so I told her she has no spine specific precautions -Discussed weight loss again and gave her a weight goal of 235 pounds if she ever wants to consider SI joint fusion as a treatment option -Patient should return to office on an as-needed basis     Patient expressed understanding of the plan and all questions were answered to the patient's satisfaction.    ___________________________________________________________________________   History: Patient is a 62 y.o. female who has been previously seen in the office for routine post-operative follow up after my partner did a T10-S1 PSIF with multi-level TLIFs.  Patient has been doing well since she was last seen.  She does note some stiffness in the morning and periodic back pain.  Her back pain is currently tolerable.  She has no pain radiating into either lower extremity.  She does have some right sided lower back pain for which she has gotten the right SI joint injections.  She notes about 75% relief with her last injection.  She has not had any injections since Jun 13, 2022.   Previous treatments: pain management, activity modification, right SI injections     Physical  Exam:   BMI of 44  General: no acute distress, appears stated age Neurologic: alert, answering questions appropriately, following commands Respiratory: unlabored breathing on room air, symmetric chest rise Psychiatric: appropriate affect, normal cadence to speech     MSK (spine):   -Strength exam                                                   Left                  Right EHL                              5/5                  -/5 TA                                 5/5                  5/5 GSC                             5/5  5/5 Knee extension            5/5                  5/5 Hip flexion                    5/5                  5/5   Had MTP fusion on the right    -Sensory exam                           Sensation intact to light touch in L3-S1 nerve distributions of bilateral lower extremities   -Straight leg raise: negative -Contralateral straight leg raise: negative -Clonus: no beats bilaterally   -Right hip exam: Positive Gaenslen's, positive FABER, TTP over the SI joint, positive SI joint compression test, positive FADIR, negative Stinchfield, negative log roll -Left hip exam: positive FADIR, no pain with SI joint compression test, no tenderness to palpation, negative FABER   Imaging: XRs of the lumbar spine from 09/05/2022 were independently reviewed and interpreted, showing subsidence of the L4/5 and L5/S1 TLIF cages into the superior endplates of the respective vertebra.  This has not changed since her last set of films.  There is no lucency around negative screws and screws not backed out.  No fracture or dislocation seen.  No evidence of instability on flexion/extension views.  No broken instrumentation noted.    Patient name: Debbie Pineda Patient MRN: [id number] Date of visit: 09/05/22

## 2022-09-13 ENCOUNTER — Other Ambulatory Visit: Payer: Self-pay

## 2022-09-13 ENCOUNTER — Ambulatory Visit: Payer: PPO | Admitting: Sports Medicine

## 2022-09-13 ENCOUNTER — Encounter: Payer: Self-pay | Admitting: Sports Medicine

## 2022-09-13 DIAGNOSIS — M533 Sacrococcygeal disorders, not elsewhere classified: Secondary | ICD-10-CM

## 2022-09-13 DIAGNOSIS — Z981 Arthrodesis status: Secondary | ICD-10-CM

## 2022-09-13 DIAGNOSIS — G8929 Other chronic pain: Secondary | ICD-10-CM | POA: Diagnosis not present

## 2022-09-13 NOTE — Progress Notes (Signed)
Debbie Pineda - 62 y.o. female MRN [id number]  Date of birth: [birth year]  Office Visit Note: Visit Date: 09/13/2022 PCP: Avis Epley, PA-C Referred by: Avis Epley, PA*  Subjective: Chief Complaint  Patient presents with   Right Hip - Pain    SI JOINT RIGHT 4 Lidocine 2 Depomedrol/ Lot number [id number] exp 4/25   HPI: Debbie Pineda is a pleasant 62 y.o. female who presents today for chronic right SI joint pain.  History of T10-S1 PSIF and multi-level TLIF with my partner on 07/2021 who feels she got symptomatic relief with surgery.  Did undergo ultrasound-guided right SI joint injection by myself back in early March, this gave.  At least 75% relief for more.  She is working on healthy weight loss.  He is following along with Dr. Christell Constant, our spine physician.  No plan for any surgical intervention in the near term.  In the past has done some home exercises, and is asking for a new she and guidance with this today.   Pertinent ROS were reviewed with the patient and found to be negative unless otherwise specified above in HPI.   Assessment & Plan: Visit Diagnoses:  1. Chronic right SI joint pain   2. S/P lumbar spinal fusion    Plan: [name] is feeling with an exacerbation of her chronic right-SI joint pain and dysfunction.  She received at least 75% relief from prior injection back in March.  Through shared decision-making, did repeat this under ultrasound guidance today, patient tolerated well.  Will allow for 48 hours of modified rest and activity, may use ice or Tylenol for pain control as needed.  I did print out a customized handout and did review these SI joint home exercises that she will start later this week and then perform once daily for treatment and help with prevention so that the injection can last lon and she can improve her function about the SI joints.  We can repeat these injections infrequently as needed.  She will follow-up with myself or Dr. Christell Constant as  indicated.  Follow-up: Return in about 3 months (around 12/14/2022), or if symptoms worsen or fail to improve.   Meds & Orders: No orders of the defined types were placed in this encounter.   Orders Placed This Encounter  Procedures   US Guided Needle Placement - No Linked Charges     Procedures: U/S-guided SI-joint injection, Right   After discussion of risk/benefits/indications, informed verbal consent was obtained. A timeout was then performed. The patient was positioned in a prone position on exam room table with a pillow placed under the pelvis for mild hip flexion. The SI joint area was cleaned and prepped with betadine and alcohol swabs. Sterile ultrasound gel was applied and the ultrasound transducer was placed in an anatomic axial plane over the PSIS, then moved distally over the SI-joint. Using ultrasound guidance, a 22-gauge, 3.5" needle was inserted from a medial to lateral approach utilizing an in-plane approach and directed into the SI-joint. The SI-joint was then injected with a mixture of 4:2 lidocaine:depomedrol with visualization of the injectate flow into the SI-joint under ultrasound visualization. The patient tolerated the procedure well without immediate complications.       Clinical History: No specialty comments available.  She reports that she quit smoking about 31 years ago. Her smoking use included cigarettes. She started smoking about 39 years ago. She has a 6 pack-year smoking history. She has been exposed to tobacco smoke. She  has never used smokeless tobacco. No results for input(s): "HGBA1C", "LABURIC" in the last 8760 hours.  Objective:   Vital Signs: There were no vitals taken for this visit.  Physical Exam  Gen: Well-appearing, in no acute distress; non-toxic CV:  Well-perfused. Warm.  Resp: Breathing unlabored on room air; no wheezing. Psych: Fluid speech in conversation; appropriate affect; normal thought process Neuro: Sensation intact throughout.  No gross coordination deficits.   Ortho Exam - Lumbar/Right SI: Well-healed large vertical scar from previous lumbar surgery.  Positive TTP over the right SI joint, positive SI joint compression, positive Fortin's point test and FABERE. No redness swelling, or overlying skin manifestations.  Imaging:  XR Lumbar Spine Complete XRs of the lumbar spine from 09/05/2022 were independently reviewed and  interpreted, showing subsidence of the L4/5 and L5/S1 TLIF cages into the  superior endplates of the respective vertebra.  This has not changed since  her last set of films.  There is no lucency around negative screws and  screws not backed out.  No fracture or dislocation seen.  No evidence of  instability on flexion/extension views.  No broken instrumentation noted.    Past Medical/Family/Surgical/Social History: Medications & Allergies reviewed per EMR, new medications updated. Patient Active Problem List   Diagnosis Date Noted   History of seizures 07/10/2022   Melena 05/15/2022   PVC's (premature ventricular contractions) 10/11/2021   Factor V Leiden mutation (HCC) 10/11/2021   Other secondary scoliosis, lumbar region    Other spondylosis with radiculopathy, lumbar region    Spondylolisthesis, lumbar region    Thoracic spondylosis with radiculopathy    Radiculopathy, lumbar region    Breakdown (mechanical) of implanted electronic neurostimulator of spinal cord electrode (lead), sequela    Degenerative disc disease, lumbar    At risk for adverse drug event 08/31/2021   Acute on chronic blood loss anemia 08/28/2021   Drug-induced constipation 08/28/2021   GERD without esophagitis 08/28/2021   Overactive bladder 08/28/2021   Chronic migraine without aura 08/28/2021   Hx of blood clots 08/28/2021   Major neurocognitive disorder (HCC) 08/28/2021   Status post lumbar spinal fusion 08/21/2021   Weight gain 07/13/2021   Encounter for removal of internal fixation device 10/21/2020    LFTs abnormal 03/02/2020   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   Gastroesophageal reflux disease 09/22/2019   SSBE (short-segment Barrett's esophagus) 09/22/2019   Flatulence 09/22/2019   Migraine 11/11/2018   Abdominal pain, epigastric 10/14/2018   LUQ pain 10/14/2018   Attention deficit hyperactivity disorder (ADHD) 01/12/2018   Insomnia 01/12/2018   Elevated liver enzymes 09/10/2017   History of diabetes mellitus 06/06/2017   Primary osteoarthritis of right hand 06/06/2017   Transaminasemia    TIA (transient ischemic attack) 05/01/2017   Dysphasia 05/01/2017   Chronic pain syndrome 05/01/2017   Confusion    Presence of right artificial knee joint 09/17/2016   H/O total knee replacement, right 08/15/2016   Presence of retained hardware    Memory disorder 08/14/2016   Cerebrovascular disease 08/14/2016   Unilateral primary osteoarthritis, right knee 05/29/2016   Primary osteoarthritis of both feet 05/29/2016   Trochanteric bursitis of both hips 05/25/2016   Neck pain 05/25/2016   Urinary urgency 04/19/2016   Chronic low back pain 01/11/2016   Total knee replacement status 03/23/2015   Heart palpitations 12/14/2013   Generalized osteoarthritis of multiple sites 11/04/2013   Elevated LFTs 03/17/2012   Cerebral infarction, unspecified (HCC) 11/29/2011   Cerebral infarction (HCC) 10/30/2011  PFO (patent foramen ovale) 10/30/2011   Acquired hypothyroidism    Thrombophlebitis    OSA on CPAP    Fibromyalgia    Status post bariatric surgery 12/07/2010   Vein disorder 11/29/2010   S/P total hysterectomy and bilateral salpingo-oophorectomy 11/29/2010   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   S/P cholecystectomy 11/29/2010   S/P ACL surgery 11/29/2010   Morbid obesity (HCC) 11/10/2010   Past Medical History:  Diagnosis Date   ADD (attention deficit disorder)    Arthritis    "all over"   Cerebral infarction (HCC) 10/30/2011    Cerebrovascular disease 08/14/2016   Chronic back pain    "all over"   Chronic low back pain 01/11/2016   Complication of anesthesia    tends to have hypotension when NPO and post-anesthesia   Constipation    takes stool softener daily   Degenerative disk disease    Degenerative joint disease    DVT (deep venous thrombosis) (HCC) 2014   RLE   Family history of adverse reaction to anesthesia    a family member woke up during surgery; "think it was my mom"   Fibromyalgia    Generalized osteoarthritis of multiple sites 11/04/2013   GERD (gastroesophageal reflux disease)    Headache    Heart palpitations 12/14/2013   resolved   History of blood clots    superficial   Hypoglycemia    Hypothyroid    takes Synthroid daily   Incomplete emptying of bladder    Insomnia    takes Trazodone nightly   Iron deficiency anemia    takes Ferrous Sulfate daily   Joint pain    Joint swelling    knees and ankles   Memory disorder 08/14/2016   Morbid obesity (HCC)    Nausea    takes Zofran as needed.Seeing GI doc   Neck pain 05/25/2016   OSA on CPAP    tested more than 5 yrs ago.     Osteoarthritis    Osteopenia    in feet   PFO (patent foramen ovale)    no murmur   PFO (patent foramen ovale)    Pre-diabetes    Primary osteoarthritis of both feet 05/29/2016   Right bunionectomy August 2017 by Dr. Lajoyce Corners   PVC's (premature ventricular contractions) 10/11/2021   Monitor 9/23: 1.8% PVCs; rare PACs; o/w NSR   Scoliosis    Skin abnormality 02/04/2020   raised area on lip   Stroke [facility]) "several"last 2014   right foot weakness; memory issues, black spot right visual field since" (03/23/2015)   Thrombophlebitis    Trochanteric bursitis of both hips 05/25/2016   Unilateral primary osteoarthritis, right knee 05/29/2016   Urinary urgency 04/19/2016   Vein disorder 11/29/2010   varicose veins both legs   Wears glasses    Family History  Problem Relation Age of Onset   Cancer Mother         skin and breast   Myasthenia gravis Mother    Breast cancer Mother    Heart disease Father    Cancer Father        brain   Parkinson's disease Father    Heart disease Sister    Heart attack Sister    Heart disease Brother    Cancer Brother    Diabetes Brother    Stroke Brother    Cancer Maternal Grandfather    Hypothyroidism Daughter    Hypertension Other    Colon cancer Neg Hx    Past  Surgical History:  Procedure Laterality Date   BIOPSY  11/14/2018   Procedure: BIOPSY;  Surgeon: Malissa Hippo, MD;  [facility]: [facility];  Service: Endoscopy;;  esophagusgastric   BIOPSY  06/07/2022   Procedure: BIOPSY;  Surgeon: Dolores Frame, MD;  Location: [facility];  Service: Gastroenterology;;   BONE EXCISION Right 08/29/2017   Procedure: right trapezium excision;  Surgeon: Cindee Salt, MD;  Location: [facility];  Service: Orthopedics;  Laterality: Right;   BUNIONECTOMY Right 08/2015   CARDIAC CATHETERIZATION     2008.  "it was fine" (not sure why she had it done, and doesn't know where)   CARPOMETACARPEL SUSPENSION PLASTY Right 08/29/2017   Procedure: SUSPENSION PLASTY RIGHT THUMB;  Surgeon: Cindee Salt, MD;  Location: [facility];  Service: Orthopedics;  Laterality: Right;   COLONOSCOPY N/A 03/25/2013   Procedure: COLONOSCOPY;  Surgeon: Malissa Hippo, MD;  [facility]: [facility];  Service: Endoscopy;  Laterality: N/A;  930   ERCP  06/02/2020   ESOPHAGOGASTRODUODENOSCOPY     ESOPHAGOGASTRODUODENOSCOPY (EGD) WITH PROPOFOL N/A 11/14/2018   Procedure: ESOPHAGOGASTRODUODENOSCOPY (EGD) WITH PROPOFOL;  Surgeon: Malissa Hippo, MD;  [facility]: [facility];  Service: Endoscopy;  Laterality: N/A;  1:55pm-office moved to 11:00am/pt notified to arrive at 9:30am per KF   ESOPHAGOGASTRODUODENOSCOPY (EGD) WITH PROPOFOL N/A 06/07/2022   Procedure: ESOPHAGOGASTRODUODENOSCOPY (EGD) WITH PROPOFOL;  Surgeon: Dolores Frame, MD;  [facility];  Service: Gastroenterology;  Laterality: N/A;  2:00pm;asa 3   EXPLORATORY LAPAROTOMY     "took fallopian tubes out"   HALLUX FUSION Right 02/12/2020   Procedure: HALLUX INTERPHANGEAL JOINT  FUSION;  Surgeon: Candelaria Stagers, DPM;  Location: [facility];  Service: Podiatry;  Laterality: Right;   HAMMER TOE SURGERY Right 02/12/2020   Procedure: SECOND AND THIRD HAMMER TOE CORRECTION; CAPSULOTOMY SECOND INTERPHALANGEAL JOINT;  Surgeon: Candelaria Stagers, DPM;  Location: [facility];  Service: Podiatry;  Laterality: Right;   JOINT REPLACEMENT     bil knee    KNEE ARTHROSCOPY Left    KNEE ARTHROSCOPY W/ ACL RECONSTRUCTION Right yrs ago   "added pins"   LAPAROSCOPIC CHOLECYSTECTOMY  ~ 2001   ROUX-EN-Y GASTRIC BYPASS  11/20/2010   [place]   SPINAL CORD STIMULATOR INSERTION N/A 04/18/2017   Procedure: LUMBAR SPINAL CORD STIMULATOR INSERTION;  Surgeon: Odette Fraction, MD;  Location[facility];  Service: Neurosurgery;  Laterality: N/A;  LUMBAR SPINAL CORD STIMULATOR INSERTION   stomach stent  04/28/2020   TENDON TRANSFER Right 08/29/2017   Procedure: right abductor pollicis longus transfer;  Surgeon: Cindee Salt, MD;  Location: [facility];  Service: Orthopedics;  Laterality: Right;   TOTAL KNEE ARTHROPLASTY Left 03/23/2015   Procedure: TOTAL KNEE ARTHROPLASTY;  Surgeon: Nadara Mustard, MD;  Location: [facility];  Service: Orthopedics;  Laterality: Left;   TOTAL KNEE ARTHROPLASTY Right 08/15/2016   Procedure: RIGHT TOTAL KNEE ARTHROPLASTY, REMOVAL ACL SCREWS;  Surgeon: Nadara Mustard, MD;  Location: [facility];  Service: Orthopedics;  Laterality: Right;   TOTAL KNEE ARTHROPLASTY WITH HARDWARE REMOVAL Right    VAGINAL HYSTERECTOMY     tah/bso   VARICOSE VEIN SURGERY Right X 2   WEIL OSTEOTOMY Right 02/12/2020   Procedure: DOUBLE L OSTEOTOMY;  Surgeon: Candelaria Stagers, DPM;  Location: [facility];  Service: Podiatry;  Laterality: Right;   Social History    Occupational History   Occupation: Disability  Occupation: formerly Charity fundraiser, [employer]  Tobacco Use   Smoking status: Former    Current packs/day: 0.00    Average packs/day: 0.8 packs/day for 8.0 years (6.0 ttl pk-yrs)    Types: Cigarettes    Start date: 12/01/1982    Quit date: 12/01/1990    Years since quitting: 31.8    Passive exposure: Past   Smokeless tobacco: Never  Vaping Use   Vaping status: Never Used  Substance and Sexual Activity   Alcohol use: No    Comment: 03/23/2015 "stopped drinking in 2012 w/gastric bypass; drank socially before bypass"   Drug use: No   Sexual activity: Not Currently    Birth control/protection: Surgical   Patient was instructed in 10 minutes of therapeutic exercises for right (and left) SI-joint to improve strength, ROM and function according to my instructions and plan of care during the office visit. A customized handout was provided and demonstration of proper technique shown and discussed. Patient did perform exercises and demonstrate understanding through teachback.  All questions discussed and answered.  Madelyn Brunner, DO Primary Care Sports Medicine Physician  [facility] - Orthopedics  This note was dictated using Dragon naturally speaking software and may contain errors in syntax, spelling, or content which have not been identified prior to signing this note.

## 2022-09-18 DIAGNOSIS — M5416 Radiculopathy, lumbar region: Secondary | ICD-10-CM | POA: Diagnosis not present

## 2022-09-18 DIAGNOSIS — G894 Chronic pain syndrome: Secondary | ICD-10-CM | POA: Diagnosis not present

## 2022-09-18 DIAGNOSIS — M47816 Spondylosis without myelopathy or radiculopathy, lumbar region: Secondary | ICD-10-CM | POA: Diagnosis not present

## 2022-09-18 DIAGNOSIS — Z79891 Long term (current) use of opiate analgesic: Secondary | ICD-10-CM | POA: Diagnosis not present

## 2022-09-18 DIAGNOSIS — M79671 Pain in right foot: Secondary | ICD-10-CM | POA: Diagnosis not present

## 2022-09-20 ENCOUNTER — Other Ambulatory Visit (INDEPENDENT_AMBULATORY_CARE_PROVIDER_SITE_OTHER): Payer: Self-pay | Admitting: Gastroenterology

## 2022-09-20 DIAGNOSIS — K219 Gastro-esophageal reflux disease without esophagitis: Secondary | ICD-10-CM

## 2022-09-20 DIAGNOSIS — Z9884 Bariatric surgery status: Secondary | ICD-10-CM

## 2022-09-25 ENCOUNTER — Telehealth (INDEPENDENT_AMBULATORY_CARE_PROVIDER_SITE_OTHER): Payer: Self-pay | Admitting: *Deleted

## 2022-09-25 ENCOUNTER — Other Ambulatory Visit (INDEPENDENT_AMBULATORY_CARE_PROVIDER_SITE_OTHER): Payer: Self-pay | Admitting: *Deleted

## 2022-09-25 MED ORDER — OMEPRAZOLE 40 MG PO CPDR: 40.0000 mg | DELAYED_RELEASE_CAPSULE | Freq: Two times a day (BID) | ORAL | 1 refills | Status: DC

## 2022-09-25 NOTE — Telephone Encounter (Signed)
Pt requesting refill on metoclopramide. Last seen April. Next appt in oct.  Pillpack

## 2022-09-26 ENCOUNTER — Other Ambulatory Visit (INDEPENDENT_AMBULATORY_CARE_PROVIDER_SITE_OTHER): Payer: Self-pay | Admitting: Gastroenterology

## 2022-09-26 DIAGNOSIS — Z9884 Bariatric surgery status: Secondary | ICD-10-CM

## 2022-09-26 DIAGNOSIS — K219 Gastro-esophageal reflux disease without esophagitis: Secondary | ICD-10-CM

## 2022-09-26 MED ORDER — METOCLOPRAMIDE HCL 5 MG PO TABS: 5.0000 mg | ORAL_TABLET | Freq: Two times a day (BID) | ORAL | 3 refills | Status: DC

## 2022-09-26 NOTE — Telephone Encounter (Signed)
Pt.notified

## 2022-10-02 ENCOUNTER — Ambulatory Visit: Payer: Medicare Other | Admitting: Nutrition

## 2022-10-16 ENCOUNTER — Ambulatory Visit: Payer: Self-pay | Admitting: Nutrition

## 2022-10-17 ENCOUNTER — Encounter: Payer: Self-pay | Admitting: Nutrition

## 2022-10-18 ENCOUNTER — Encounter: Payer: Self-pay | Admitting: Nutrition

## 2022-10-30 ENCOUNTER — Ambulatory Visit (INDEPENDENT_AMBULATORY_CARE_PROVIDER_SITE_OTHER): Payer: PPO | Admitting: Gastroenterology

## 2022-10-30 ENCOUNTER — Encounter (INDEPENDENT_AMBULATORY_CARE_PROVIDER_SITE_OTHER): Payer: Self-pay | Admitting: Gastroenterology

## 2022-10-30 VITALS — BP 113/79 | HR 98 | Temp 98.0°F | Ht 65.0 in | Wt 277.3 lb

## 2022-10-30 DIAGNOSIS — K921 Melena: Secondary | ICD-10-CM | POA: Diagnosis not present

## 2022-10-30 DIAGNOSIS — K5903 Drug induced constipation: Secondary | ICD-10-CM

## 2022-10-30 DIAGNOSIS — K219 Gastro-esophageal reflux disease without esophagitis: Secondary | ICD-10-CM | POA: Diagnosis not present

## 2022-10-30 DIAGNOSIS — R110 Nausea: Secondary | ICD-10-CM

## 2022-10-30 DIAGNOSIS — K5900 Constipation, unspecified: Secondary | ICD-10-CM | POA: Diagnosis not present

## 2022-10-30 MED ORDER — NALOXEGOL OXALATE 25 MG PO TABS
ORAL_TABLET | ORAL | 3 refills | Status: AC
Start: 2022-10-30 — End: ?

## 2022-10-30 NOTE — Patient Instructions (Addendum)
Continue omeprazole twice daily, Famotidine in the evening Keep scheduled EGD in November  Continue reglan twice daily  Continue iron pill daily  Continue movtanik-refill sent Please have PCP fax over labs once these have resulted Continue stool softeners daily Avoid Please avoid NSAIDs (advil, aleve, naproxen, goody powder, ibuprofen, BC powder, celebrex, diclofenac) as these can be very hard on your GI tract, causing inflammation, ulcers and damage to the lining of your GI tract.    Follow up 4 months  It was a pleasure to see you today. I want to create trusting relationships with patients and provide genuine, compassionate, and quality care. I truly value your feedback! please be on the lookout for a survey regarding your visit with me today. I appreciate your input about our visit and your time in completing this!    Tonesha Tsou L. Jeanmarie Hubert, MSN, APRN, AGNP-C Adult-Gerontology Nurse Practitioner [facility] at [facility]

## 2022-10-30 NOTE — Progress Notes (Signed)
Referring Provider: Avis Epley, PA* Primary Care Physician:  Avis Epley, PA-C Primary GI Physician: Dr. Levon Hedger   Chief Complaint  Patient presents with   Follow-up    Patient here today for a follow up visit.   HPI:   Debbie Pineda is a 62 y.o. 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 Following with pain management. Having ongoing melena. No BRBPR. Trying to get in with [facility] for repeat EGD regarding gastrogastric fistula. No nausea or vomiting. Taking PPI BID, famotidine in the evening, reglan BID, Iron daily.   Recommended to continue with PPI BID and H2B, continue reglan BID,, daily iron, continue movantik, follow up with [facility] for EGD, avoid NSAIDs. Due for colonoscopy march 2025.  Present: Last labs in may with hgb 12.9  She has EGD scheduled with [facility] in November  She is still having some darker stools, occurring a few times per week. No BRBPR. She is having a BM every day to every other day usually, sometimes can go up to 3 days without a BM. She is doing movantik, docusate sodium 240x 3. She notes that constipation depends on how many oxycodone she is having to take per day. She is taking on average 4 oxycodone per day. Denies any abdominal pain.   She is having some swelling in her legs at times, she wonders if she has fluid in her lungs. She has some SOB when she lays down at night. She has not discussed this with her PCP yet. She notes this does not always occur, more so if she has been doing a lot of activity prior to bed.  She has nausea at times, she is still doing reglan BID. Appetite is fair. She  sometimes may wait all day to eat, though continues to gain weight. No GERD symptoms.   She is set to have labs this week with her PCP.   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 pylori or intestinal metaplasia.    H. pylori stool testing was negative for H. pylori.  Levon Hedger advised her to follow-up with [facility] regarding her gastrogastric fistula.   EGD: 12/06/21   - Roux-en-y gastric bypass with dilated GJ anastamosis  (>26mm), several surgical staples removed.  - Persistent GG Fistula s/p successful partial closure using APC and endoscopic suturing  - Evidence of prior Ampullotomy without residual  adenomatous changes  - Otherwise normal remnant stomach and duodenum   CT A/P with contrast 09/06/21 Prominent stool throughout the colon favors constipation. 2. Other imaging findings of potential clinical significance: Small right renal angiomyolipoma. Pneumobilia. Prior cholecystectomy. Prior gastric bypass. Extensive postoperative findings in the thoracolumbar spine.  ERCP: 06/02/20: No specimens collected.  - Gastric bypass. Gastrojejunal anastomosis characterized by healthy appearing mucosa. - Pre-existing gastrogastrostomy AXIOS stent. Removed and replaced with two 10Fr x 4cm double pigtail stents to maintain patency (given the anticipated need for  ampullary surveillance in the future). - A single ampullary polyp (adenomatous) Snare papillectomy of the  major papilla was performed.  Resected and retrieved  with hemostasis achieved through the use of snare tip soft coagulation. - The entire main bile duct was mildly dilated. acquired. - A biliary sphincterotomy was performed.  - The biliary tree was swept and sludge was found. (Recommended repeat EGD in 6-12 months,  pending path)                     Upper Endoscopy:04/28/20  (EUS) The examined esophagus was endoscopically normal. Evidence of a gastric bypass was found. A gastric pouch with a small size was found. The staple line appeared intact. The gastrojejunal anastomosis was characterized by healthy appearing mucosa. This was traversed. The pouch-to-jejunum limb was characterized by healthy  appearing mucosa. The examined jejunum was normal.    Last Colonoscopy: march 2015, normal, small eternal hemorrhoids repeat in 10 years      Past Medical History:  Diagnosis Date   ADD (attention deficit disorder)    Arthritis    "all over"   Cerebral infarction (HCC) 10/30/2011   Cerebrovascular disease 08/14/2016   Chronic back pain    "all over"   Chronic low back pain 01/11/2016   Complication of anesthesia    tends to have hypotension when NPO and post-anesthesia   Constipation    takes stool softener daily   Degenerative disk disease    Degenerative joint disease    DVT (deep venous thrombosis) (HCC) 2014   RLE   Family history of adverse reaction to anesthesia    a family member woke up during surgery; "think it was my mom"   Fibromyalgia    Generalized osteoarthritis of multiple sites 11/04/2013   GERD (gastroesophageal reflux disease)    Headache    Heart palpitations 12/14/2013   resolved   History of blood clots    superficial   Hypoglycemia    Hypothyroid    takes Synthroid daily   Incomplete emptying of bladder    Insomnia    takes Trazodone nightly   Iron deficiency anemia    takes Ferrous Sulfate daily   Joint pain    Joint swelling    knees and ankles   Memory disorder 08/14/2016   Morbid obesity (HCC)    Nausea    takes Zofran as needed.Seeing GI doc   Neck pain 05/25/2016   OSA on CPAP    tested more than 5 yrs ago.     Osteoarthritis    Osteopenia    in feet   PFO (patent foramen ovale)    no murmur   PFO (patent foramen ovale)    Pre-diabetes    Primary  osteoarthritis of both feet 05/29/2016   Right bunionectomy August 2017 by Dr. Lajoyce Corners   PVC's (premature ventricular contractions) 10/11/2021   Monitor 9/23: 1.8% PVCs; rare PACs; o/w NSR   Scoliosis    Skin abnormality 02/04/2020   raised area on lip   Stroke [facility]) "several"last 2014   right foot weakness; memory issues, black spot right visual field since" (03/23/2015)   Thrombophlebitis    Trochanteric bursitis of both hips 05/25/2016   Unilateral primary osteoarthritis, right knee 05/29/2016   Urinary urgency 04/19/2016   Vein disorder 11/29/2010   varicose veins both legs   Wears glasses     Past Surgical History:  Procedure Laterality Date   BIOPSY  11/14/2018   Procedure: BIOPSY;  Surgeon: Malissa Hippo, MD;  [facility];  Service: Endoscopy;;  esophagusgastric   BIOPSY  06/07/2022   Procedure:  BIOPSY;  Surgeon: Marguerita Merles, Reuel Boom, MD;  [facility];  Service: Gastroenterology;;   BONE EXCISION Right 08/29/2017   Procedure: right trapezium excision;  Surgeon: Cindee Salt, MD;  Location: [facility];  Service: Orthopedics;  Laterality: Right;   BUNIONECTOMY Right 08/2015   CARDIAC CATHETERIZATION     2008.  "it was fine" (not sure why she had it done, and doesn't know where)   CARPOMETACARPEL SUSPENSION PLASTY Right 08/29/2017   Procedure: SUSPENSION PLASTY RIGHT THUMB;  Surgeon: Cindee Salt, MD;  Location: [facility];  Service: Orthopedics;  Laterality: Right;   COLONOSCOPY N/A 03/25/2013   Procedure: COLONOSCOPY;  Surgeon: Malissa Hippo, MD;  [facility];  Service: Endoscopy;  Laterality: N/A;  930   ERCP  06/02/2020   ESOPHAGOGASTRODUODENOSCOPY     ESOPHAGOGASTRODUODENOSCOPY (EGD) WITH PROPOFOL N/A 11/14/2018   Procedure: ESOPHAGOGASTRODUODENOSCOPY (EGD) WITH PROPOFOL;  Surgeon: Malissa Hippo, MD;  [facility];  Service: Endoscopy;  Laterality: N/A;  1:55pm-office moved to 11:00am/pt notified  to arrive at 9:30am per KF   ESOPHAGOGASTRODUODENOSCOPY (EGD) WITH PROPOFOL N/A 06/07/2022   Procedure: ESOPHAGOGASTRODUODENOSCOPY (EGD) WITH PROPOFOL;  Surgeon: Dolores Frame, MD;  [facility];  Service: Gastroenterology;  Laterality: N/A;  2:00pm;asa 3   EXPLORATORY LAPAROTOMY     "took fallopian tubes out"   HALLUX FUSION Right 02/12/2020   Procedure: HALLUX INTERPHANGEAL JOINT  FUSION;  Surgeon: Candelaria Stagers, DPM;  Location: [facility];  Service: Podiatry;  Laterality: Right;   HAMMER TOE SURGERY Right 02/12/2020   Procedure: SECOND AND THIRD HAMMER TOE CORRECTION; CAPSULOTOMY SECOND INTERPHALANGEAL JOINT;  Surgeon: Candelaria Stagers, DPM;  Location: [facility];  Service: Podiatry;  Laterality: Right;   JOINT REPLACEMENT     bil knee    KNEE ARTHROSCOPY Left    KNEE ARTHROSCOPY W/ ACL RECONSTRUCTION Right yrs ago   "added pins"   LAPAROSCOPIC CHOLECYSTECTOMY  ~ 2001   ROUX-EN-Y GASTRIC BYPASS  11/20/2010   [place]   SPINAL CORD STIMULATOR INSERTION N/A 04/18/2017   Procedure: LUMBAR SPINAL CORD STIMULATOR INSERTION;  Surgeon: Odette Fraction, MD;  Location[facility];  Service: Neurosurgery;  Laterality: N/A;  LUMBAR SPINAL CORD STIMULATOR INSERTION   stomach stent  04/28/2020   TENDON TRANSFER Right 08/29/2017   Procedure: right abductor pollicis longus transfer;  Surgeon: Cindee Salt, MD;  Location: [facility];  Service: Orthopedics;  Laterality: Right;   TOTAL KNEE ARTHROPLASTY Left 03/23/2015   Procedure: TOTAL KNEE ARTHROPLASTY;  Surgeon: Nadara Mustard, MD;  Location: [facility];  Service: Orthopedics;  Laterality: Left;   TOTAL KNEE ARTHROPLASTY Right 08/15/2016   Procedure: RIGHT TOTAL KNEE ARTHROPLASTY, REMOVAL ACL SCREWS;  Surgeon: Nadara Mustard, MD;  Location: [facility];  Service: Orthopedics;  Laterality: Right;   TOTAL KNEE ARTHROPLASTY WITH HARDWARE REMOVAL Right    VAGINAL HYSTERECTOMY     tah/bso   VARICOSE VEIN SURGERY  Right X 2   WEIL OSTEOTOMY Right 02/12/2020   Procedure: DOUBLE L OSTEOTOMY;  Surgeon: Candelaria Stagers, DPM;  Location: [facility];  Service: Podiatry;  Laterality: Right;    Current Outpatient Medications  Medication Sig Dispense Refill   baclofen (LIORESAL) 10 MG tablet Take 1 tablet (10 mg total) by mouth 3 (three) times daily. 90 each 0   Biotin 5000 MCG CAPS daily at 6 (six) AM.     Cholecalciferol (VITAMIN D3) 125 MCG (  5000 UT) CAPS daily at 6 (six) AM.     Cyanocobalamin (VITAMIN B-12) 5000 MCG SUBL daily at 6 (six) AM.     diclofenac sodium (VOLTAREN) 1 % GEL Apply 4 g topically 4 (four) times daily as needed (PAIN). 5 Tube 1   donepezil (ARICEPT) 10 MG tablet Take 1 tablet by mouth at bedtime. 90 tablet 2   DULoxetine (CYMBALTA) 60 MG capsule Take 60 mg by mouth 2 (two) times daily.     famotidine (PEPCID) 40 MG tablet Take 1 tablet by mouth at bedtime. 90 tablet 2   ONETOUCH ULTRA test strip      OneTouch UltraSoft 2 Lancets MISC      Blood Glucose Monitoring Suppl (FREESTYLE PRECISION NEO SYSTEM) w/Device KIT USE 1 STRIP TO CHECK GLUCOSE 4 TIMES DAILY AND AS NEEDED (Patient not taking: Reported on 10/30/2022)     ferrous gluconate (FERGON) 324 MG tablet Take 1 tablet (324 mg total) by mouth 2 (two) times daily with a meal. 60 tablet 0   folic acid (FOLVITE) 400 MCG tablet Take 400 mcg by mouth daily.     gabapentin (NEURONTIN) 800 MG tablet 800 mg. One tid     Galcanezumab-gnlm (EMGALITY) 120 MG/ML SOSY INJECT 1 ML SUBCUTANEOUSLY  ONCE EVERY MONTH 1 mL 11   Krill Oil 350 MG CAPS Take 350 mg by mouth at bedtime.      levothyroxine (SYNTHROID) 125 MCG tablet Take 1 tablet (125 mcg total) by mouth daily before breakfast. 30 tablet 0   Methylfol-Methylcob-Acetylcyst (METAFOLBIC PLUS) 6-2-600 MG TABS Take 1 tablet by mouth daily. 30 tablet 11   methylphenidate (CONCERTA) 27 MG PO CR tablet Take 1 tablet (27 mg total) by mouth 2 (two) times daily. 10 am & 1400 60 tablet 0    metoCLOPramide (REGLAN) 5 MG tablet Take 1 tablet (5 mg total) by mouth 2 (two) times daily. 60 tablet 3   miconazole (ZEASORB-AF) 2 % powder Every shift as needed. Apply to red areas in folds of skin.     mirabegron ER (MYRBETRIQ) 50 MG TB24 tablet Take 1 tablet (50 mg total) by mouth daily. 30 tablet 0   Multiple Minerals-Vitamins (CAL-MAG-ZINC-D PO) Take 3 tablets by mouth daily. 333 mg-133 unit -133 mg-5 mg     Multiple Vitamins-Minerals (ONE-A-DAY WOMENS PETITES) TABS Take 1 tablet by mouth 2 (two) times daily.     naloxegol oxalate (MOVANTIK) 25 MG TABS tablet TAKE (1) TABLET BY MOUTH ONCE DAILY. 30 tablet 3   NARCAN 4 MG/0.1ML LIQD nasal spray kit Place 0.1 sprays (0.4 mg total) into the nose 2 (two) times daily as needed. 1 each 0   NUCYNTA ER 100 MG 12 hr tablet SMARTSIG:1 Tablet(s) By Mouth Every 12 Hours     omeprazole (PRILOSEC) 40 MG capsule Take 1 capsule (40 mg total) by mouth 2 (two) times daily. 60 capsule 1   ondansetron (ZOFRAN-ODT) 4 MG disintegrating tablet Take 1 tablet (4 mg total) by mouth every 8 (eight) hours as needed for nausea or vomiting. 20 tablet 0   Oxycodone HCl 10 MG TABS Take 10 mg by mouth every 6 (six) hours as needed.     Rimegepant Sulfate (NURTEC) 75 MG TBDP DISSOLVE 1 TABLET BY MOUTH ONCE DAILY AS NEEDED, max is 1 tablet daily 8 tablet 11   rivaroxaban (XARELTO) 20 MG TABS tablet Take 1 tablet (20 mg total) by mouth daily at 6 PM. 30 tablet 0   tiZANidine (ZANAFLEX) 4 MG tablet  Take 1 tablet (4 mg total) by mouth every 6 (six) hours as needed for muscle spasms. 30 tablet 0   topiramate (TOPAMAX) 100 MG tablet Take 1 tablet (100 mg total) by mouth 2 (two) times daily. 180 tablet 1   traZODone (DESYREL) 100 MG tablet Take 0.5 tablets (50 mg total) by mouth at bedtime. 15 tablet 0   triamcinolone cream (KENALOG) 0.1 % SMARTSIG:1 Application Topical 2-3 Times Daily     trospium (SANCTURA) 20 MG tablet Take 1 tablet (20 mg total) by mouth 2 (two) times daily. 60  tablet 0   vitamin C (ASCORBIC ACID) 500 MG tablet Take 500 mg by mouth daily.     No current facility-administered medications for this visit.    Allergies as of 10/30/2022 - Review Complete 10/30/2022  Allergen Reaction Noted   Lyrica [pregabalin] Shortness Of Breath and Swelling 12/01/2015   Belsomra [suvorexant] Other (See Comments) 12/01/2015   Morphine and codeine Itching 10/26/2010   Sulfamethoxazole-trimethoprim Itching and Rash 03/10/2015   Tape Itching and Rash 03/10/2015    Family History  Problem Relation Age of Onset   Cancer Mother        skin and breast   Myasthenia gravis Mother    Breast cancer Mother    Heart disease Father    Cancer Father        brain   Parkinson's disease Father    Heart disease Sister    Heart attack Sister    Heart disease Brother    Cancer Brother    Diabetes Brother    Stroke Brother    Cancer Maternal Grandfather    Hypothyroidism Daughter    Hypertension Other    Colon cancer Neg Hx     Social History   Socioeconomic History   Marital status: Married    Spouse name: Fayrene Fearing   Number of children: 1   Years of education: 14   Highest education level: Not on file  Occupational History   Occupation: Disability   Occupation: formerly Charity fundraiser, [employer]  Tobacco Use   Smoking status: Former    Current packs/day: 0.00    Average packs/day: 0.8 packs/day for 8.0 years (6.0 ttl pk-yrs)    Types: Cigarettes    Start date: 12/01/1982    Quit date: 12/01/1990    Years since quitting: 31.9    Passive exposure: Past   Smokeless tobacco: Never  Vaping Use   Vaping status: Never Used  Substance and Sexual Activity   Alcohol use: No    Comment: 03/23/2015 "stopped drinking in 2012 w/gastric bypass; drank socially before bypass"   Drug use: No   Sexual activity: Not Currently    Birth control/protection: Surgical  Other Topics Concern   Not on file  Social History Narrative   Lives with husband   Caffeine use: No soda   Mainly water,  drinks decaf tea   Right handed   Social Determinants of Health   Financial Resource Strain: Not on file  Food Insecurity: Not on file  Transportation Needs: Not on file  Physical Activity: Not on file  Stress: Not on file  Social Connections: Not on file    Review of systems General: negative for malaise, night sweats, fever, chills, weight loss Neck: Negative for lumps, goiter, pain and significant neck swelling Resp: Negative for cough, wheezing, dyspnea at rest CV: Negative for chest pain, leg swelling, palpitations, orthopnea GI: denies hematochezia, nausea, vomiting, diarrhea, constipation, dysphagia, odyonophagia, early satiety or unintentional weight loss. +  dark stools/melena MSK: Negative for joint pain or swelling, back pain, and muscle pain. Derm: Negative for itching or rash Psych: Denies depression, anxiety, memory loss, confusion. No homicidal or suicidal ideation.  Heme: Negative for prolonged bleeding, bruising easily, and swollen nodes. Endocrine: Negative for cold or heat intolerance, polyuria, polydipsia and goiter. Neuro: negative for tremor, gait imbalance, syncope and seizures. The remainder of the review of systems is noncontributory.  Physical Exam: BP 113/79 (BP Location: Left Arm, Patient Position: Sitting, Cuff Size: Normal)   Pulse 98   Temp 98 F (36.7 C) (Temporal)   Ht 5\' 5"  (1.651 m)   Wt 277 lb 4.8 oz (125.8 kg)   BMI 46.15 kg/m  General:   Alert and oriented. No distress noted. Pleasant and cooperative.  Head:  Normocephalic and atraumatic. Eyes:  Conjuctiva clear without scleral icterus. Mouth:  Oral mucosa pink and moist. Good dentition. No lesions. Heart: Normal rate and rhythm, s1 and s2 heart sounds present.  Lungs: Clear lung sounds in all lobes. Respirations equal and unlabored. Abdomen:  +BS, soft, non-tender and non-distended. No rebound or guarding. No HSM or masses noted. Derm: No palmar erythema or jaundice Msk:  Symmetrical  without gross deformities. Normal posture. Extremities:  Without edema. Neurologic:  Alert and  oriented x4 Psych:  Alert and cooperative. Normal mood and affect.  Invalid input(s): "6 MONTHS"   ASSESSMENT: Debbie Pineda is a 62 y.o. female presenting today for follow up of GERD, melena and constipation  GERD: well controlled on omeprazole 40mg  BID and famotidine 40mg  in the evening. GERD well controlled, will continue with current regimen  Melena/Nausea: intermittent dark stools that seems to have lessened since her last visit. Notably is on PO iron pills which could be contributing to her darker stools. Hgb in May was WNL. No BRBPR. Recent EGD with ongoing gastrogastric fistula and gastritis. Should continue to avoid all NSAIDs. She is having lab work done in the next few days for her PCP, I have asked that she have lab results sent to [place] after completion. Nausea well controlled on reglan BID, initially prescribed by her surgeon. She has E[facility] with Dr. Edyth Gunnels for her gastrogastric fistula next month.   Constipation: taking movantik 25mg  daily and stool softners, having a BM usually every day to every other day, occasionally can go 3 days without a BM. Does take oxycodone QID which is likely the cause of her constipation. Will continue with current regimen of movantik and stool softeners.   PLAN:  Continue PPI BID, Famotidine at bedtime  2. Keep scheduled EGD in November  3. Continue reglan BID 4. Continue PO iron pill daily  5. Continue movtanik 6. Continue stool softeners 7. Have PCP fax over lab results 8. Avoid NSAIDs 9. Follow with PCP regarding SOB/swelling to legs  All questions were answered, patient verbalized understanding and is in agreement with plan as outlined above.   Follow Up: 4 months   Kaceton Vieau L. Jeanmarie Hubert, MSN, APRN, AGNP-C Adult-Gerontology Nurse Practitioner [facility]  I have reviewed the note and agree with the APP's  assessment as described in this progress note  Katrinka Blazing, MD Gastroenterology and Hepatology [facility]

## 2022-10-31 DIAGNOSIS — E7849 Other hyperlipidemia: Secondary | ICD-10-CM | POA: Diagnosis not present

## 2022-10-31 DIAGNOSIS — R7309 Other abnormal glucose: Secondary | ICD-10-CM | POA: Diagnosis not present

## 2022-10-31 NOTE — Progress Notes (Signed)
Note faxed.

## 2022-11-13 DIAGNOSIS — G894 Chronic pain syndrome: Secondary | ICD-10-CM | POA: Diagnosis not present

## 2022-11-13 DIAGNOSIS — M47816 Spondylosis without myelopathy or radiculopathy, lumbar region: Secondary | ICD-10-CM | POA: Diagnosis not present

## 2022-11-13 DIAGNOSIS — M79671 Pain in right foot: Secondary | ICD-10-CM | POA: Diagnosis not present

## 2022-11-13 DIAGNOSIS — M5416 Radiculopathy, lumbar region: Secondary | ICD-10-CM | POA: Diagnosis not present

## 2022-11-16 DIAGNOSIS — E782 Mixed hyperlipidemia: Secondary | ICD-10-CM | POA: Diagnosis not present

## 2022-11-16 DIAGNOSIS — R7309 Other abnormal glucose: Secondary | ICD-10-CM | POA: Diagnosis not present

## 2022-11-16 DIAGNOSIS — Z6841 Body Mass Index (BMI) 40.0 and over, adult: Secondary | ICD-10-CM | POA: Diagnosis not present

## 2022-11-16 DIAGNOSIS — F902 Attention-deficit hyperactivity disorder, combined type: Secondary | ICD-10-CM | POA: Diagnosis not present

## 2022-11-16 DIAGNOSIS — E1159 Type 2 diabetes mellitus with other circulatory complications: Secondary | ICD-10-CM | POA: Diagnosis not present

## 2022-11-16 DIAGNOSIS — E039 Hypothyroidism, unspecified: Secondary | ICD-10-CM | POA: Diagnosis not present

## 2022-11-22 DIAGNOSIS — F1120 Opioid dependence, uncomplicated: Secondary | ICD-10-CM | POA: Diagnosis not present

## 2022-11-22 DIAGNOSIS — G43909 Migraine, unspecified, not intractable, without status migrainosus: Secondary | ICD-10-CM | POA: Diagnosis not present

## 2022-11-22 DIAGNOSIS — E1159 Type 2 diabetes mellitus with other circulatory complications: Secondary | ICD-10-CM | POA: Diagnosis not present

## 2022-11-22 DIAGNOSIS — D6851 Activated protein C resistance: Secondary | ICD-10-CM | POA: Diagnosis not present

## 2022-12-06 DIAGNOSIS — K316 Fistula of stomach and duodenum: Secondary | ICD-10-CM | POA: Diagnosis not present

## 2022-12-06 DIAGNOSIS — E039 Hypothyroidism, unspecified: Secondary | ICD-10-CM | POA: Diagnosis not present

## 2022-12-06 DIAGNOSIS — Z885 Allergy status to narcotic agent status: Secondary | ICD-10-CM | POA: Diagnosis not present

## 2022-12-06 DIAGNOSIS — G4700 Insomnia, unspecified: Secondary | ICD-10-CM | POA: Diagnosis not present

## 2022-12-06 DIAGNOSIS — Z882 Allergy status to sulfonamides status: Secondary | ICD-10-CM | POA: Diagnosis not present

## 2022-12-06 DIAGNOSIS — Z90710 Acquired absence of both cervix and uterus: Secondary | ICD-10-CM | POA: Diagnosis not present

## 2022-12-06 DIAGNOSIS — Z980 Intestinal bypass and anastomosis status: Secondary | ICD-10-CM | POA: Diagnosis not present

## 2022-12-06 DIAGNOSIS — Z6841 Body Mass Index (BMI) 40.0 and over, adult: Secondary | ICD-10-CM | POA: Diagnosis not present

## 2022-12-06 DIAGNOSIS — Z8673 Personal history of transient ischemic attack (TIA), and cerebral infarction without residual deficits: Secondary | ICD-10-CM | POA: Diagnosis not present

## 2022-12-06 DIAGNOSIS — K3189 Other diseases of stomach and duodenum: Secondary | ICD-10-CM | POA: Diagnosis not present

## 2022-12-06 DIAGNOSIS — F341 Dysthymic disorder: Secondary | ICD-10-CM | POA: Diagnosis not present

## 2022-12-06 HISTORY — PX: UPPER GI ENDOSCOPY: SHX6162

## 2022-12-12 DIAGNOSIS — M47816 Spondylosis without myelopathy or radiculopathy, lumbar region: Secondary | ICD-10-CM | POA: Diagnosis not present

## 2022-12-12 DIAGNOSIS — M79671 Pain in right foot: Secondary | ICD-10-CM | POA: Diagnosis not present

## 2022-12-12 DIAGNOSIS — M5416 Radiculopathy, lumbar region: Secondary | ICD-10-CM | POA: Diagnosis not present

## 2022-12-12 DIAGNOSIS — G894 Chronic pain syndrome: Secondary | ICD-10-CM | POA: Diagnosis not present

## 2022-12-18 NOTE — Progress Notes (Unsigned)
Office Visit Note  Patient: Debbie Pineda             Date of Birth: [birth year]           MRN: [id number]             PCP: Avis Epley, PA-C Referring: Avis Epley, [place]* Visit Date: 01/01/2023 Occupation: @GUAROCC @  Subjective:  Trapezius muscle tension and tenderness   History of Present Illness: Debbie Pineda is a 62 y.o. female with history of fibromyalgia and osteoarthritis.   She is taking baclofen 10 mg 1 tablet 3 times daily for muscle spasms, Cymbalta 60 mg 2 capsules daily, gabapentin 800 mg 3 times daily, Nucynta every 12 hours for pain relief, oxycodone 10 mg every 6 hours as needed for pain relief, and tizanidine 4 mg every 6 hours as needed for muscle spasms.  She remains on trazodone 100 mg half tablet at bedtime for insomnia.  Patient states for the past few days she has been experiencing an increased difficulty sleeping and has had to take trazodone 100 mg or 200 mg to help her sleep.  Patient states that most nights she sleeps about 8 to 10 hours.  She continues to have generalized myalgias and muscle tenderness due to fibromyalgia.  She is having trapezius muscle tension and tenderness bilaterally.  She requested trigger point injections today. Patient states that she has had a total of 3 doses of Ozempic and is already lost 5 pounds. She denies any new medical conditions.   Activities of Daily Living:  Patient reports morning stiffness for all day. Patient Reports nocturnal pain.  Difficulty dressing/grooming: Denies Difficulty climbing stairs: Reports Difficulty getting out of chair: Reports Difficulty using hands for taps, buttons, cutlery, and/or writing: Denies  Review of Systems  Constitutional:  Positive for fatigue.  HENT:  Positive for mouth dryness. Negative for mouth sores.   Eyes:  Positive for photophobia, pain and dryness. Negative for visual disturbance.  Respiratory:  Positive for shortness of breath. Negative for cough and  wheezing.   Cardiovascular:  Negative for chest pain and palpitations.  Gastrointestinal:  Positive for constipation. Negative for blood in stool and diarrhea.  Endocrine: Negative for increased urination.  Genitourinary:  Positive for involuntary urination.  Musculoskeletal:  Positive for joint pain, gait problem, joint pain, joint swelling, myalgias, muscle weakness, morning stiffness, muscle tenderness and myalgias.  Skin:  Positive for hair loss. Negative for color change, rash and sensitivity to sunlight.  Allergic/Immunologic: Negative for susceptible to infections.  Neurological:  Positive for headaches. Negative for dizziness.  Hematological:  Negative for swollen glands.  Psychiatric/Behavioral:  Positive for sleep disturbance. Negative for depressed mood. The patient is not nervous/anxious.     PMFS History:  Patient Active Problem List   Diagnosis Date Noted   History of seizures 07/10/2022   Melena 05/15/2022   PVC's (premature ventricular contractions) 10/11/2021   Factor V Leiden mutation (HCC) 10/11/2021   Other secondary scoliosis, lumbar region    Other spondylosis with radiculopathy, lumbar region    Spondylolisthesis, lumbar region    Thoracic spondylosis with radiculopathy    Radiculopathy, lumbar region    Breakdown (mechanical) of implanted electronic neurostimulator of spinal cord electrode (lead), sequela    Degenerative disc disease, lumbar    At risk for adverse drug event 08/31/2021   Acute on chronic blood loss anemia 08/28/2021   Drug-induced constipation 08/28/2021   GERD without esophagitis 08/28/2021   Overactive bladder 08/28/2021  Chronic migraine without aura 08/28/2021   Hx of blood clots 08/28/2021   Major neurocognitive disorder (HCC) 08/28/2021   Status post lumbar spinal fusion 08/21/2021   Weight gain 07/13/2021   Encounter for removal of internal fixation device 10/21/2020   LFTs abnormal 03/02/2020   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   Gastroesophageal reflux disease 09/22/2019   SSBE (short-segment Barrett's esophagus) 09/22/2019   Flatulence 09/22/2019   Migraine 11/11/2018   Abdominal pain, epigastric 10/14/2018   LUQ pain 10/14/2018   Attention deficit hyperactivity disorder (ADHD) 01/12/2018   Insomnia 01/12/2018   Elevated liver enzymes 09/10/2017   History of diabetes mellitus 06/06/2017   Primary osteoarthritis of right hand 06/06/2017   Transaminasemia    TIA (transient ischemic attack) 05/01/2017   Dysphasia 05/01/2017   Chronic pain syndrome 05/01/2017   Confusion    Presence of right artificial knee joint 09/17/2016   H/O total knee replacement, right 08/15/2016   Presence of retained hardware    Memory disorder 08/14/2016   Cerebrovascular disease 08/14/2016   Unilateral primary osteoarthritis, right knee 05/29/2016   Primary osteoarthritis of both feet 05/29/2016   Trochanteric bursitis of both hips 05/25/2016   Neck pain 05/25/2016   Urinary urgency 04/19/2016   Chronic low back pain 01/11/2016   Total knee replacement status 03/23/2015   Heart palpitations 12/14/2013   Generalized osteoarthritis of multiple sites 11/04/2013   Elevated LFTs 03/17/2012   Cerebral infarction, unspecified (HCC) 11/29/2011   Cerebral infarction (HCC) 10/30/2011   PFO (patent foramen ovale) 10/30/2011   Acquired hypothyroidism    Thrombophlebitis    OSA on CPAP    Fibromyalgia    Status post bariatric surgery 12/07/2010   Vein disorder 11/29/2010   S/P total hysterectomy and bilateral salpingo-oophorectomy 11/29/2010   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   S/P cholecystectomy 11/29/2010   S/P ACL surgery 11/29/2010   Morbid obesity (HCC) 11/10/2010    Past Medical History:  Diagnosis Date   ADD (attention deficit disorder)    Arthritis    "all over"   Cerebral infarction (HCC) 10/30/2011   Cerebrovascular disease 08/14/2016   Chronic back pain     "all over"   Chronic low back pain 01/11/2016   Complication of anesthesia    tends to have hypotension when NPO and post-anesthesia   Constipation    takes stool softener daily   Degenerative disk disease    Degenerative joint disease    DVT (deep venous thrombosis) (HCC) 2014   RLE   Family history of adverse reaction to anesthesia    a family member woke up during surgery; "think it was my mom"   Fibromyalgia    Generalized osteoarthritis of multiple sites 11/04/2013   GERD (gastroesophageal reflux disease)    Headache    Heart palpitations 12/14/2013   resolved   History of blood clots    superficial   Hypoglycemia    Hypothyroid    takes Synthroid daily   Incomplete emptying of bladder    Insomnia    takes Trazodone nightly   Iron deficiency anemia    takes Ferrous Sulfate daily   Joint pain    Joint swelling    knees and ankles   Memory disorder 08/14/2016   Morbid obesity (HCC)    Nausea    takes Zofran as needed.Seeing GI doc   Neck pain 05/25/2016   OSA on CPAP    tested more than 5 yrs ago.  Osteoarthritis    Osteopenia    in feet   PFO (patent foramen ovale)    no murmur   PFO (patent foramen ovale)    Pre-diabetes    Primary osteoarthritis of both feet 05/29/2016   Right bunionectomy August 2017 by Dr. Lajoyce Corners   PVC's (premature ventricular contractions) 10/11/2021   Monitor 9/23: 1.8% PVCs; rare PACs; o/w NSR   Scoliosis    Skin abnormality 02/04/2020   raised area on lip   Stroke [facility]) "several"last 2014   right foot weakness; memory issues, black spot right visual field since" (03/23/2015)   Thrombophlebitis    Trochanteric bursitis of both hips 05/25/2016   Unilateral primary osteoarthritis, right knee 05/29/2016   Urinary urgency 04/19/2016   Vein disorder 11/29/2010   varicose veins both legs   Wears glasses     Family History  Problem Relation Age of Onset   Cancer Mother        skin and breast   Myasthenia gravis Mother     Breast cancer Mother    Heart disease Father    Cancer Father        brain   Parkinson's disease Father    Heart disease Sister    Heart attack Sister    Heart disease Brother    Cancer Brother    Diabetes Brother    Stroke Brother    Cancer Maternal Grandfather    Hypothyroidism Daughter    Hypertension Other    Colon cancer Neg Hx    Past Surgical History:  Procedure Laterality Date   BIOPSY  11/14/2018   Procedure: BIOPSY;  Surgeon: Malissa Hippo, MD;  [facility];  Service: Endoscopy;;  esophagusgastric   BIOPSY  06/07/2022   Procedure: BIOPSY;  Surgeon: Dolores Frame, MD;  Location: [facility];  Service: Gastroenterology;;   BONE EXCISION Right 08/29/2017   Procedure: right trapezium excision;  Surgeon: Cindee Salt, MD;  Location: [facility];  Service: Orthopedics;  Laterality: Right;   BUNIONECTOMY Right 08/2015   CARDIAC CATHETERIZATION     2008.  "it was fine" (not sure why she had it done, and doesn't know where)   CARPOMETACARPEL SUSPENSION PLASTY Right 08/29/2017   Procedure: SUSPENSION PLASTY RIGHT THUMB;  Surgeon: Cindee Salt, MD;  Location: [facility];  Service: Orthopedics;  Laterality: Right;   COLONOSCOPY N/A 03/25/2013   Procedure: COLONOSCOPY;  Surgeon: Malissa Hippo, MD;  [facility];  Service: Endoscopy;  Laterality: N/A;  930   ERCP  06/02/2020   ESOPHAGOGASTRODUODENOSCOPY     ESOPHAGOGASTRODUODENOSCOPY (EGD) WITH PROPOFOL N/A 11/14/2018   Procedure: ESOPHAGOGASTRODUODENOSCOPY (EGD) WITH PROPOFOL;  Surgeon: Malissa Hippo, MD;  [facility];  Service: Endoscopy;  Laterality: N/A;  1:55pm-office moved to 11:00am/pt notified to arrive at 9:30am per KF   ESOPHAGOGASTRODUODENOSCOPY (EGD) WITH PROPOFOL N/A 06/07/2022   Procedure: ESOPHAGOGASTRODUODENOSCOPY (EGD) WITH PROPOFOL;  Surgeon: Dolores Frame, MD;  [facility]: [facility];  Service: Gastroenterology;   Laterality: N/A;  2:00pm;asa 3   EXPLORATORY LAPAROTOMY     "took fallopian tubes out"   HALLUX FUSION Right 02/12/2020   Procedure: HALLUX INTERPHANGEAL JOINT  FUSION;  Surgeon: Candelaria Stagers, DPM;  Location: [facility];  Service: Podiatry;  Laterality: Right;   HAMMER TOE SURGERY Right 02/12/2020   Procedure: SECOND AND THIRD HAMMER TOE CORRECTION; CAPSULOTOMY SECOND INTERPHALANGEAL JOINT;  Surgeon: Candelaria Stagers, DPM;  Location: [facility];  Service: Podiatry;  Laterality: Right;   JOINT REPLACEMENT     bil knee    KNEE ARTHROSCOPY Left    KNEE ARTHROSCOPY W/ ACL RECONSTRUCTION Right yrs ago   "added pins"   LAPAROSCOPIC CHOLECYSTECTOMY  ~ 2001   ROUX-EN-Y GASTRIC BYPASS  11/20/2010   [place]   SPINAL CORD STIMULATOR INSERTION N/A 04/18/2017   Procedure: LUMBAR SPINAL CORD STIMULATOR INSERTION;  Surgeon: Odette Fraction, MD;  Location: [facility];  Service: Neurosurgery;  Laterality: N/A;  LUMBAR SPINAL CORD STIMULATOR INSERTION   stomach stent  04/28/2020   TENDON TRANSFER Right 08/29/2017   Procedure: right abductor pollicis longus transfer;  Surgeon: Cindee Salt, MD;  Location: [facility];  Service: Orthopedics;  Laterality: Right;   TOTAL KNEE ARTHROPLASTY Left 03/23/2015   Procedure: TOTAL KNEE ARTHROPLASTY;  Surgeon: Nadara Mustard, MD;  Location: [facility];  Service: Orthopedics;  Laterality: Left;   TOTAL KNEE ARTHROPLASTY Right 08/15/2016   Procedure: RIGHT TOTAL KNEE ARTHROPLASTY, REMOVAL ACL SCREWS;  Surgeon: Nadara Mustard, MD;  Location: [facility];  Service: Orthopedics;  Laterality: Right;   TOTAL KNEE ARTHROPLASTY WITH HARDWARE REMOVAL Right    UPPER GI ENDOSCOPY  12/06/2022   VAGINAL HYSTERECTOMY     tah/bso   VARICOSE VEIN SURGERY Right X 2   WEIL OSTEOTOMY Right 02/12/2020   Procedure: DOUBLE L OSTEOTOMY;  Surgeon: Candelaria Stagers, DPM;  Location: [facility];  Service: Podiatry;  Laterality: Right;   Social History    Social History Narrative   Lives with husband   Caffeine use: No soda   Mainly water, drinks decaf tea   Right handed   Immunization History  Administered Date(s) Administered   Influenza Split 11/01/2011, 08/23/2014   Influenza-Unspecified 08/23/2014, 11/22/2017   Moderna SARS-COV2 Booster Vaccination 12/16/2019, 12/31/2020   Moderna Sars-Covid-2 Vaccination 03/16/2019, 04/04/2019, 04/13/2019, 05/15/2019   Pneumococcal Polysaccharide-23 04/26/2017   Zoster Recombinant(Shingrix) 07/02/2017, 01/08/2018     Objective: Vital Signs: BP 108/76 (BP Location: Left Arm, Patient Position: Sitting, Cuff Size: Large)   Pulse 92   Resp 17   Ht 5\' 5"  (1.651 m)   Wt 273 lb (123.8 kg)   BMI 45.43 kg/m    Physical Exam Vitals and nursing note reviewed.  Constitutional:      Appearance: She is well-developed.  HENT:     Head: Normocephalic and atraumatic.  Eyes:     Conjunctiva/sclera: Conjunctivae normal.  Cardiovascular:     Rate and Rhythm: Normal rate and regular rhythm.     Heart sounds: Normal heart sounds.  Pulmonary:     Effort: Pulmonary effort is normal.     Breath sounds: Normal breath sounds.  Abdominal:     General: Bowel sounds are normal.     Palpations: Abdomen is soft.  Musculoskeletal:     Cervical back: Normal range of motion.  Lymphadenopathy:     Cervical: No cervical adenopathy.  Skin:    General: Skin is warm and dry.     Capillary Refill: Capillary refill takes less than 2 seconds.  Neurological:     Mental Status: She is alert and oriented to person, place, and time.  Psychiatric:        Behavior: Behavior normal.      Musculoskeletal Exam: C-spine has good range of motion.  Trapezius muscle tension and tenderness noted bilaterally.  Shoulder joints, elbow joints, wrist joints, MCPs, PIPs, DIPs have good range of motion with no synovitis.  Complete fist formation  bilaterally.  Hip joints have good range of motion with no groin pain.  Knee  replacements have good range of motion no warmth or effusion.  Ankle joints have good range of motion with no joint swelling  CDAI Exam: CDAI Score: -- Patient Global: --; Provider Global: -- Swollen: --; Tender: -- Joint Exam 01/01/2023   No joint exam has been documented for this visit   There is currently no information documented on the homunculus. Go to the Rheumatology activity and complete the homunculus joint exam.  Investigation: No additional findings.  Imaging: No results found.  Recent Labs: Lab Results  Component Value Date   WBC 7.0 06/06/2022   HGB 12.9 06/06/2022   PLT 231 06/06/2022   NA 138 06/06/2022   K 4.1 06/06/2022   CL 108 06/06/2022   CO2 22 06/06/2022   GLUCOSE 73 06/06/2022   BUN 11 06/06/2022   CREATININE 0.98 06/06/2022   BILITOT 0.9 09/06/2021   ALKPHOS 150 (H) 09/06/2021   AST 17 09/06/2021   ALT 13 09/06/2021   PROT 7.1 09/06/2021   ALBUMIN 3.7 09/06/2021   CALCIUM 8.8 (L) 06/06/2022   GFRAA 81 10/14/2018    Speciality Comments: No specialty comments available.  Procedures:  Trigger Point Inj  Date/Time: 01/01/2023 2:41 PM  Performed by: Gearldine Bienenstock, PA-C Authorized by: Gearldine Bienenstock, PA-C   Consent Given by:  Patient Site marked: the procedure site was marked   Timeout: prior to procedure the correct patient, procedure, and site was verified   Indications:  Pain Total # of Trigger Points:  2 Location: neck   Needle Size:  27 G Approach:  Dorsal Medications #1:  0.5 mL lidocaine 1 %; 10 mg triamcinolone acetonide 40 MG/ML Medications #2:  0.5 mL lidocaine 1 %; 10 mg triamcinolone acetonide 40 MG/ML Patient tolerance:  Patient tolerated the procedure well with no immediate complications  Allergies: Lyrica [pregabalin], Belsomra [suvorexant], Morphine and codeine, Sulfamethoxazole-trimethoprim, and Tape   Assessment / Plan:     Visit Diagnoses: Fibromyalgia: Patient continues to experience intermittent myalgias and  muscle tenderness due to fibromyalgia.  She remains under the care of pain management.  She is taking baclofen 10 mg 1 tablet 3 times daily for muscle spasms, Cymbalta 60 mg 2 capsules daily, gabapentin 800 mg 3 times daily, Nucynta every 12 hours for pain relief, oxycodone 10 mg every 6 hours as needed for pain relief, and tizanidine 4 mg every 6 hours as needed for muscle spasms. Patient presents today with trapezius muscle tension and tenderness bilaterally.  She requested bilateral trapezius trigger point injections.  She tolerated procedures well.  Procedure notes were completed above.  Aftercare was discussed. She will follow up in 6 months or sooner if needed.   Trapezius muscle spasm: Patient presents today with trapezius muscle tension and tenderness bilaterally.  She is been experiencing increased muscle tension over the past several weeks.  She has not been experiencing any muscle spasms but has a prescription for baclofen and tizanidine which she can take as needed.  Patient had trigger point injections performed bilaterally on 07/03/2022 which righted significant relief but her symptoms have recurred.  Patient requested repeat trigger point injections today.  She tolerated procedures well.  Procedure notes were completed above.  Aftercare was discussed.  Primary osteoarthritis of both hands: She has PIP and DIP thickening consistent with osteoarthritis of both hands.  No synovitis noted.  Complete fist formation bilaterally.  Trochanteric bursitis of both hips: Patient  continues to experience intermittent discomfort on the lateral aspect of both hips consistent with trochanteric bursitis.  History of total bilateral knee replacement: Doing well.  Good ROM. No effusion noted.   Primary osteoarthritis of both feet: Patient continues to have chronic pain involving both feet.  She wears [employer] which provide support while ambulating.  She has been using a cane to assist with  ambulation.  Degeneration of intervertebral disc of lumbar region without discogenic back pain or lower extremity pain - Surgery by Dr. Otelia Sergeant in the past.  Chronic pain.  Under the care of pain management.  DDD (degenerative disc disease), thoracic: Chronic pain.  Other medical conditions are listed as follows:   History of diabetes mellitus  History of thrombophlebitis  History of sleep apnea  PFO (patent foramen ovale)  History of gastric bypass  History of cerebral infarction  Orders: Orders Placed This Encounter  Procedures   Trigger Point Inj   No orders of the defined types were placed in this encounter.    Follow-Up Instructions: Return in about 6 months (around 07/02/2023) for Fibromyalgia, Osteoarthritis.   Gearldine Bienenstock, PA-C  Note - This record has been created using Dragon software.  Chart creation errors have been sought, but may not always  have been located. Such creation errors do not reflect on  the standard of medical care.

## 2022-12-22 DIAGNOSIS — D6851 Activated protein C resistance: Secondary | ICD-10-CM | POA: Diagnosis not present

## 2022-12-22 DIAGNOSIS — E1159 Type 2 diabetes mellitus with other circulatory complications: Secondary | ICD-10-CM | POA: Diagnosis not present

## 2022-12-25 ENCOUNTER — Encounter: Payer: Self-pay | Admitting: Neurology

## 2022-12-25 ENCOUNTER — Ambulatory Visit (INDEPENDENT_AMBULATORY_CARE_PROVIDER_SITE_OTHER): Payer: PPO | Admitting: Neurology

## 2022-12-25 VITALS — BP 129/68 | HR 81 | Ht 65.0 in | Wt 278.4 lb

## 2022-12-25 DIAGNOSIS — G43909 Migraine, unspecified, not intractable, without status migrainosus: Secondary | ICD-10-CM | POA: Diagnosis not present

## 2022-12-25 DIAGNOSIS — I679 Cerebrovascular disease, unspecified: Secondary | ICD-10-CM

## 2022-12-25 DIAGNOSIS — Z87898 Personal history of other specified conditions: Secondary | ICD-10-CM

## 2022-12-25 MED ORDER — METAFOLBIC PLUS 6-2-600 MG PO TABS: 1.0000 | ORAL_TABLET | Freq: Every day | ORAL | 11 refills | Status: DC

## 2022-12-25 MED ORDER — TOPIRAMATE 100 MG PO TABS: 100.0000 mg | ORAL_TABLET | Freq: Two times a day (BID) | ORAL | 3 refills | Status: DC

## 2022-12-25 MED ORDER — EMGALITY 120 MG/ML ~~LOC~~ SOSY: PREFILLED_SYRINGE | SUBCUTANEOUS | 11 refills | Status: DC

## 2022-12-25 NOTE — Patient Instructions (Signed)
We will continue current medication.  I have sent refills.  Please call for new or worsening issues.

## 2022-12-25 NOTE — Progress Notes (Signed)
Patient: Debbie Pineda Date of Birth: 16-May-1960  Reason for Visit: Follow up History from: Patient Primary Neurologist: Willis/Chima  ASSESSMENT AND PLAN 62 y.o. year old female   1.  Chronic migraine headaches -Under good control, continue Emgality for migraine prevention, continue Nurtec for migraine rescue  2.  Memory loss -Very good stability over time, today MoCA 29/30, will remain off Aricept  3.  Seizures -Well-controlled, continue Topamax 100 mg twice daily  4. 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    Dispense:  180 tablet    Refill:  3   DISCONTD: Galcanezumab-gnlm (EMGALITY) 120 MG/ML SOSY    Sig: INJECT 1 ML SUBCUTANEOUSLY  ONCE EVERY MONTH    Dispense:  1 mL    Refill:  11   Methylfol-Methylcob-Acetylcyst (METAFOLBIC PLUS) 6-2-600 MG TABS    Sig: Take 1 tablet by mouth daily.    Dispense:  30 tablet    Refill:  11   Galcanezumab-gnlm (EMGALITY) 120 MG/ML SOSY    Sig: INJECT 1 ML SUBCUTANEOUSLY  ONCE EVERY MONTH    Dispense:  1 mL    Refill:  11   HISTORY OF PRESENT ILLNESS: Today 12/25/22 1 migraine a month, Emgality working great. Rarely takes Nurtec, maybe once a month, works great. No seizures, on Topamax 100 mg BID. Stopped Aricept, some trouble with word finding, thinks has gotten some worse. Still has chronic fatigue. Started Ozempic. MOCA 29/30. 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 Uses CPAP religiously.  Has remained on metafolbic since stroke, also on Xarelto for PFO.  07/10/22 SS: Here today, about 2 migraines a month, remains on Emgality. Tried sample of Ubrelvy, didn't notice any benefit over Nurtec. Nurtec works very well and  quickly. Needs refills.   No seizures, still on Topamax 100 mg BID.  Memory is stable, on Aricept 10 mg, has hard time with word finding. Worsens when she is tired. MOCA is 28/30.  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   Sees pain MD for chronic back pain, Fibro. On Oxycodone, Nucynta.   Has been having dark stools, seeing GI. Had been taking Celebrex.   Foot pain is better, on gabapentin from pain doctor.   HISTORY  Brief HPI: 62 year old female with a history of hypothyroidism, MTHFR deficiency, remote CVA, seizure, migraines, fibromyalgia who follows in clinic for headaches, seizures, foot pain, and memory loss.    At her last visit, CTH was ordered. Emgality and Nurtec were continued for migraines. Topamax was continued for headache and seizures. Gabapentin was continued for foot pain. Donepezil was continued for memory loss.    Interval History: She continues to have headaches about 1 week before she is due for her Emgality injection.  Nurtec helps but does not last long.   Memory is doing well on donepezil. She feels like it has improved since her last visit.   Foot pain has improve since her lumbar fusion. She is also getting a shot in her foot tomorrow to see if this will help with her pain.   No new episodes concerning for  seizure. Last episode concerning for seizure was 2 years ago.    [facility] 07/04/21 showed her old left occipital infarct with no acute process. She has not noticed any vision issues since her last visit.   Prior headache therapies: Prevention: Topamax 100 mg BID Cymbalta 60 mg BID Effexor Amitriptyline Gabapentin 800 mg TID Lyrica Aimovig Ajovy Emgality 120 mg monthly   Rescue: Lidocaine patches Tizanidine 4 mg PRN Nurtec 75 mg PRN Zofran 4 mg PRN  REVIEW OF SYSTEMS: Out of a complete 14 system review of symptoms, the patient complains only of the  following symptoms, and all other reviewed systems are negative.  See HPI  ALLERGIES: Allergies  Allergen Reactions   Lyrica [Pregabalin] Shortness Of Breath and Swelling    lower extremity edema and weight gain   Belsomra [Suvorexant] Other (See Comments)    unknown   Morphine And Codeine Itching    Upper torso   Sulfamethoxazole-Trimethoprim Itching and Rash    Bactrim   Tape Itching and Rash    Please use "paper" tape Rash if left on longer than 24 hrs    HOME MEDICATIONS: Outpatient Medications Prior to Visit  Medication Sig Dispense Refill   baclofen (LIORESAL) 10 MG tablet Take 1 tablet (10 mg total) by mouth 3 (three) times daily. 90 each 0   Biotin 5000 MCG CAPS daily at 6 (six) AM.     Blood Glucose Monitoring Suppl (FREESTYLE PRECISION NEO SYSTEM) w/Device KIT      Cholecalciferol (VITAMIN D3) 125 MCG (5000 UT) CAPS daily at 6 (six) AM.     Cyanocobalamin (VITAMIN B-12) 5000 MCG SUBL daily at 6 (six) AM.     diclofenac sodium (VOLTAREN) 1 % GEL Apply 4 g topically 4 (four) times daily as needed (PAIN). 5 Tube 1   DULoxetine (CYMBALTA) 60 MG capsule Take 60 mg by mouth 2 (two) times daily.     famotidine (PEPCID) 40 MG tablet Take 1 tablet by mouth at bedtime. (Patient taking differently: Take 40 mg by mouth 2 (two) times daily.) 90 tablet 2   ferrous gluconate (FERGON) 324 MG tablet Take 1 tablet (324 mg total) by mouth 2 (two) times daily with a meal. 60 tablet 0   folic acid (FOLVITE) 400 MCG tablet Take 400 mcg by mouth daily.     gabapentin (NEURONTIN) 800 MG tablet 800 mg. One tid     Krill Oil 350 MG CAPS Take 350 mg by mouth at bedtime.      levothyroxine (SYNTHROID) 125 MCG tablet Take 1 tablet (125 mcg total) by mouth daily before breakfast. 30 tablet 0   methylphenidate (CONCERTA) 27 MG PO CR tablet Take 1 tablet (27 mg total) by mouth 2 (two) times daily. 10 am & 1400 60 tablet 0   metoCLOPramide (REGLAN) 5 MG tablet Take 1 tablet (5 mg total) by mouth 2  (two) times daily. 60 tablet 3   miconazole (ZEASORB-AF) 2 % powder Every shift as needed. Apply to red areas in folds of skin.     mirabegron ER (MYRBETRIQ) 50 MG TB24 tablet Take 1 tablet (50 mg total) by mouth daily. 30 tablet 0   Multiple Minerals-Vitamins (CAL-MAG-ZINC-D PO) Take 3 tablets by mouth daily. 333 mg-133 unit -133 mg-5 mg     Multiple Vitamins-Minerals (ONE-A-DAY WOMENS PETITES) TABS Take 1 tablet by mouth 2 (two) times daily.     naloxegol oxalate (MOVANTIK) 25 MG TABS tablet TAKE (1) TABLET BY MOUTH ONCE DAILY. 30 tablet 3  NARCAN 4 MG/0.1ML LIQD nasal spray kit Place 0.1 sprays (0.4 mg total) into the nose 2 (two) times daily as needed. 1 each 0   NUCYNTA ER 100 MG 12 hr tablet SMARTSIG:1 Tablet(s) By Mouth Every 12 Hours     omeprazole (PRILOSEC) 40 MG capsule Take 1 capsule (40 mg total) by mouth 2 (two) times daily. 60 capsule 1   ondansetron (ZOFRAN-ODT) 4 MG disintegrating tablet Take 1 tablet (4 mg total) by mouth every 8 (eight) hours as needed for nausea or vomiting. 20 tablet 0   ONETOUCH ULTRA test strip      OneTouch UltraSoft 2 Lancets MISC      Oxycodone HCl 10 MG TABS Take 10 mg by mouth every 6 (six) hours as needed.     Rimegepant Sulfate (NURTEC) 75 MG TBDP DISSOLVE 1 TABLET BY MOUTH ONCE DAILY AS NEEDED, max is 1 tablet daily 8 tablet 11   rivaroxaban (XARELTO) 20 MG TABS tablet Take 1 tablet (20 mg total) by mouth daily at 6 PM. 30 tablet 0   tiZANidine (ZANAFLEX) 4 MG tablet Take 1 tablet (4 mg total) by mouth every 6 (six) hours as needed for muscle spasms. 30 tablet 0   traZODone (DESYREL) 100 MG tablet Take 0.5 tablets (50 mg total) by mouth at bedtime. 15 tablet 0   triamcinolone cream (KENALOG) 0.1 % SMARTSIG:1 Application Topical 2-3 Times Daily     trospium (SANCTURA) 20 MG tablet Take 1 tablet (20 mg total) by mouth 2 (two) times daily. 60 tablet 0   vitamin C (ASCORBIC ACID) 500 MG tablet Take 500 mg by mouth daily.     Galcanezumab-gnlm  (EMGALITY) 120 MG/ML SOSY INJECT 1 ML SUBCUTANEOUSLY  ONCE EVERY MONTH 1 mL 11   Methylfol-Methylcob-Acetylcyst (METAFOLBIC PLUS) 6-2-600 MG TABS Take 1 tablet by mouth daily. (Patient taking differently: Take 1 tablet by mouth at bedtime.) 30 tablet 11   topiramate (TOPAMAX) 100 MG tablet Take 1 tablet (100 mg total) by mouth 2 (two) times daily. 180 tablet 1   donepezil (ARICEPT) 10 MG tablet Take 1 tablet by mouth at bedtime. 90 tablet 2   No facility-administered medications prior to visit.    PAST MEDICAL HISTORY: Past Medical History:  Diagnosis Date   ADD (attention deficit disorder)    Arthritis    "all over"   Cerebral infarction (HCC) 10/30/2011   Cerebrovascular disease 08/14/2016   Chronic back pain    "all over"   Chronic low back pain 01/11/2016   Complication of anesthesia    tends to have hypotension when NPO and post-anesthesia   Constipation    takes stool softener daily   Degenerative disk disease    Degenerative joint disease    DVT (deep venous thrombosis) (HCC) 2014   RLE   Family history of adverse reaction to anesthesia    a family member woke up during surgery; "think it was my mom"   Fibromyalgia    Generalized osteoarthritis of multiple sites 11/04/2013   GERD (gastroesophageal reflux disease)    Headache    Heart palpitations 12/14/2013   resolved   History of blood clots    superficial   Hypoglycemia    Hypothyroid    takes Synthroid daily   Incomplete emptying of bladder    Insomnia    takes Trazodone nightly   Iron deficiency anemia    takes Ferrous Sulfate daily   Joint pain    Joint swelling    knees and ankles  Memory disorder 08/14/2016   Morbid obesity (HCC)    Nausea    takes Zofran as needed.Seeing GI doc   Neck pain 05/25/2016   OSA on CPAP    tested more than 5 yrs ago.     Osteoarthritis    Osteopenia    in feet   PFO (patent foramen ovale)    no murmur   PFO (patent foramen ovale)    Pre-diabetes    Primary  osteoarthritis of both feet 05/29/2016   Right bunionectomy August 2017 by Dr. Lajoyce Corners   PVC's (premature ventricular contractions) 10/11/2021   Monitor 9/23: 1.8% PVCs; rare PACs; o/w NSR   Scoliosis    Skin abnormality 02/04/2020   raised area on lip   Stroke [facility]) "several"last 2014   right foot weakness; memory issues, black spot right visual field since" (03/23/2015)   Thrombophlebitis    Trochanteric bursitis of both hips 05/25/2016   Unilateral primary osteoarthritis, right knee 05/29/2016   Urinary urgency 04/19/2016   Vein disorder 11/29/2010   varicose veins both legs   Wears glasses     PAST SURGICAL HISTORY: Past Surgical History:  Procedure Laterality Date   BIOPSY  11/14/2018   Procedure: BIOPSY;  Surgeon: Malissa Hippo, MD;  [facility];  Service: Endoscopy;;  esophagusgastric   BIOPSY  06/07/2022   Procedure: BIOPSY;  Surgeon: Dolores Frame, MD;  [facility]: [facility];  Service: Gastroenterology;;   BONE EXCISION Right 08/29/2017   Procedure: right trapezium excision;  Surgeon: Cindee Salt, MD;  Location: [facility];  Service: Orthopedics;  Laterality: Right;   BUNIONECTOMY Right 08/2015   CARDIAC CATHETERIZATION     2008.  "it was fine" (not sure why she had it done, and doesn't know where)   CARPOMETACARPEL SUSPENSION PLASTY Right 08/29/2017   Procedure: SUSPENSION PLASTY RIGHT THUMB;  Surgeon: Cindee Salt, MD;  Location: [facility];  Service: Orthopedics;  Laterality: Right;   COLONOSCOPY N/A 03/25/2013   Procedure: COLONOSCOPY;  Surgeon: Malissa Hippo, MD;  [facility];  Service: Endoscopy;  Laterality: N/A;  930   ERCP  06/02/2020   ESOPHAGOGASTRODUODENOSCOPY     ESOPHAGOGASTRODUODENOSCOPY (EGD) WITH PROPOFOL N/A 11/14/2018   Procedure: ESOPHAGOGASTRODUODENOSCOPY (EGD) WITH PROPOFOL;  Surgeon: Malissa Hippo, MD;  [facility];  Service: Endoscopy;  Laterality: N/A;  1:55pm-office moved  to 11:00am/pt notified to arrive at 9:30am per KF   ESOPHAGOGASTRODUODENOSCOPY (EGD) WITH PROPOFOL N/A 06/07/2022   Procedure: ESOPHAGOGASTRODUODENOSCOPY (EGD) WITH PROPOFOL;  Surgeon: Dolores Frame, MD;  [facility];  Service: Gastroenterology;  Laterality: N/A;  2:00pm;asa 3   EXPLORATORY LAPAROTOMY     "took fallopian tubes out"   HALLUX FUSION Right 02/12/2020   Procedure: HALLUX INTERPHANGEAL JOINT  FUSION;  Surgeon: Candelaria Stagers, DPM;  Location: [facility];  Service: Podiatry;  Laterality: Right;   HAMMER TOE SURGERY Right 02/12/2020   Procedure: SECOND AND THIRD HAMMER TOE CORRECTION; CAPSULOTOMY SECOND INTERPHALANGEAL JOINT;  Surgeon: Candelaria Stagers, DPM;  Location: [facility];  Service: Podiatry;  Laterality: Right;   JOINT REPLACEMENT     bil knee    KNEE ARTHROSCOPY Left    KNEE ARTHROSCOPY W/ ACL RECONSTRUCTION Right yrs ago   "added pins"   LAPAROSCOPIC CHOLECYSTECTOMY  ~ 2001   ROUX-EN-Y GASTRIC BYPASS  11/20/2010   [place]   SPINAL CORD STIMULATOR INSERTION N/A 04/18/2017   Procedure: LUMBAR SPINAL CORD  STIMULATOR INSERTION;  Surgeon: Odette Fraction, MD;  Location: [facility];  Service: Neurosurgery;  Laterality: N/A;  LUMBAR SPINAL CORD STIMULATOR INSERTION   stomach stent  04/28/2020   TENDON TRANSFER Right 08/29/2017   Procedure: right abductor pollicis longus transfer;  Surgeon: Cindee Salt, MD;  Location: [facility];  Service: Orthopedics;  Laterality: Right;   TOTAL KNEE ARTHROPLASTY Left 03/23/2015   Procedure: TOTAL KNEE ARTHROPLASTY;  Surgeon: Nadara Mustard, MD;  Location[facility];  Service: Orthopedics;  Laterality: Left;   TOTAL KNEE ARTHROPLASTY Right 08/15/2016   Procedure: RIGHT TOTAL KNEE ARTHROPLASTY, REMOVAL ACL SCREWS;  Surgeon: Nadara Mustard, MD;  Location: [facility];  Service: Orthopedics;  Laterality: Right;   TOTAL KNEE ARTHROPLASTY WITH HARDWARE REMOVAL Right    VAGINAL HYSTERECTOMY     tah/bso    VARICOSE VEIN SURGERY Right X 2   WEIL OSTEOTOMY Right 02/12/2020   Procedure: DOUBLE L OSTEOTOMY;  Surgeon: Candelaria Stagers, DPM;  Location: [facility];  Service: Podiatry;  Laterality: Right;    FAMILY HISTORY: Family History  Problem Relation Age of Onset   Cancer Mother        skin and breast   Myasthenia gravis Mother    Breast cancer Mother    Heart disease Father    Cancer Father        brain   Parkinson's disease Father    Heart disease Sister    Heart attack Sister    Heart disease Brother    Cancer Brother    Diabetes Brother    Stroke Brother    Cancer Maternal Grandfather    Hypothyroidism Daughter    Hypertension Other    Colon cancer Neg Hx     SOCIAL HISTORY: Social History   Socioeconomic History   Marital status: Married    Spouse name: Fayrene Fearing   Number of children: 1   Years of education: 14   Highest education level: Not on file  Occupational History   Occupation: Disability   Occupation: formerly Charity fundraiser, [employer]  Tobacco Use   Smoking status: Former    Current packs/day: 0.00    Average packs/day: 0.8 packs/day for 8.0 years (6.0 ttl pk-yrs)    Types: Cigarettes    Start date: 12/01/1982    Quit date: 12/01/1990    Years since quitting: 32.0    Passive exposure: Past   Smokeless tobacco: Never  Vaping Use   Vaping status: Never Used  Substance and Sexual Activity   Alcohol use: No    Comment: 03/23/2015 "stopped drinking in 2012 w/gastric bypass; drank socially before bypass"   Drug use: No   Sexual activity: Not Currently    Birth control/protection: Surgical  Other Topics Concern   Not on file  Social History Narrative   Lives with husband   Caffeine use: No soda   Mainly water, drinks decaf tea   Right handed   Social Determinants of Health   Financial Resource Strain: Not on file  Food Insecurity: Not on file  Transportation Needs: Not on file  Physical Activity: Not on file  Stress: Not on file  Social Connections: Not  on file  Intimate Partner Violence: Not on file    PHYSICAL EXAM  Vitals:   12/25/22 1102  BP: 129/68  Pulse: 81  Weight: 278 lb 6.4 oz (126.3 kg)  Height: 5\' 5"  (1.651 m)    Body mass index is 46.33 kg/m.    12/25/2022   11:19  AM 07/10/2022    3:36 PM 06/29/2021    9:36 AM  Montreal Cognitive Assessment   Visuospatial/ Executive (0/5) 5 5 5   Naming (0/3) 3 3 3   Attention: Read list of digits (0/2) 2 1 2   Attention: Read list of letters (0/1) 1 1 1   Attention: Serial 7 subtraction starting at 100 (0/3) 3 3 2   Language: Repeat phrase (0/2) 2 2 1   Language : Fluency (0/1) 1 0 1  Abstraction (0/2) 2 2 2   Delayed Recall (0/5) 4 5 3   Orientation (0/6) 6 6 6   Total 29 28 26   Adjusted Score (based on education) 29     Generalized: Well developed, in no acute distress  Neurological examination  Mentation: Alert oriented to time, place, history taking. Follows all commands speech and language fluent Cranial nerve II-XII: Pupils were equal round reactive to light. 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 Facial sensation and strength were normal.  Head turning and shoulder shrug were normal and symmetric. Motor: The motor testing reveals 5 over 5 strength of all 4 extremities. Good symmetric motor tone is noted throughout.  Sensory: Sensory testing is intact to soft touch on all 4 extremities. No evidence of extinction is noted.  Coordination: Cerebellar testing reveals good finger-nose-finger and heel-to-shin bilaterally.  Gait and station: Gait is normal, cautious, antalgic, has single-point cane Reflexes: Deep tendon reflexes are symmetric but decreased  DIAGNOSTIC DATA (LABS, IMAGING, TESTING) - I reviewed patient records, labs, notes, testing and imaging myself where available.  Lab Results  Component Value Date   WBC 7.0 06/06/2022   HGB 12.9 06/06/2022   HCT 39.4 06/06/2022   MCV 98.3 06/06/2022   PLT 231 06/06/2022      Component Value  Date/Time   NA 138 06/06/2022 1317   NA 143 10/11/2021 1105   K 4.1 06/06/2022 1317   CL 108 06/06/2022 1317   CO2 22 06/06/2022 1317   GLUCOSE 73 06/06/2022 1317   BUN 11 06/06/2022 1317   BUN 11 10/11/2021 1105   CREATININE 0.98 06/06/2022 1317   CREATININE 0.92 10/14/2018 1302   CALCIUM 8.8 (L) 06/06/2022 1317   PROT 7.1 09/06/2021 1119   ALBUMIN 3.7 09/06/2021 1119   AST 17 09/06/2021 1119   ALT 13 09/06/2021 1119   ALKPHOS 150 (H) 09/06/2021 1119   BILITOT 0.9 09/06/2021 1119   GFRNONAA >60 06/06/2022 1317   GFRNONAA 70 10/14/2018 1302   GFRAA 81 10/14/2018 1302   Lab Results  Component Value Date   CHOL 151 05/02/2017   HDL 74 05/02/2017   LDLCALC 66 05/02/2017   TRIG 54 05/02/2017   CHOLHDL 2.0 05/02/2017   Lab Results  Component Value Date   HGBA1C 5.0 05/02/2017   Lab Results  Component Value Date   VITAMINB12 1,582 (H) 08/14/2016   Lab Results  Component Value Date   TSH 3.700 10/11/2021    Margie Ege, AGNP-C, DNP 12/25/2022, 11:40 AM [facility] [address] [place], [place] [address] [phone]

## 2023-01-01 ENCOUNTER — Ambulatory Visit: Payer: PPO | Attending: Physician Assistant | Admitting: Physician Assistant

## 2023-01-01 ENCOUNTER — Encounter: Payer: Self-pay | Admitting: Physician Assistant

## 2023-01-01 VITALS — BP 108/76 | HR 92 | Resp 17 | Ht 65.0 in | Wt 273.0 lb

## 2023-01-01 DIAGNOSIS — M19071 Primary osteoarthritis, right ankle and foot: Secondary | ICD-10-CM | POA: Diagnosis not present

## 2023-01-01 DIAGNOSIS — Z96653 Presence of artificial knee joint, bilateral: Secondary | ICD-10-CM

## 2023-01-01 DIAGNOSIS — M7061 Trochanteric bursitis, right hip: Secondary | ICD-10-CM

## 2023-01-01 DIAGNOSIS — Z8669 Personal history of other diseases of the nervous system and sense organs: Secondary | ICD-10-CM | POA: Diagnosis not present

## 2023-01-01 DIAGNOSIS — M5134 Other intervertebral disc degeneration, thoracic region: Secondary | ICD-10-CM

## 2023-01-01 DIAGNOSIS — M51369 Other intervertebral disc degeneration, lumbar region without mention of lumbar back pain or lower extremity pain: Secondary | ICD-10-CM | POA: Diagnosis not present

## 2023-01-01 DIAGNOSIS — Z8673 Personal history of transient ischemic attack (TIA), and cerebral infarction without residual deficits: Secondary | ICD-10-CM

## 2023-01-01 DIAGNOSIS — M797 Fibromyalgia: Secondary | ICD-10-CM

## 2023-01-01 DIAGNOSIS — Z9884 Bariatric surgery status: Secondary | ICD-10-CM

## 2023-01-01 DIAGNOSIS — Z8639 Personal history of other endocrine, nutritional and metabolic disease: Secondary | ICD-10-CM | POA: Diagnosis not present

## 2023-01-01 DIAGNOSIS — M19041 Primary osteoarthritis, right hand: Secondary | ICD-10-CM

## 2023-01-01 DIAGNOSIS — Z8672 Personal history of thrombophlebitis: Secondary | ICD-10-CM

## 2023-01-01 DIAGNOSIS — M62838 Other muscle spasm: Secondary | ICD-10-CM

## 2023-01-01 DIAGNOSIS — M19042 Primary osteoarthritis, left hand: Secondary | ICD-10-CM

## 2023-01-01 DIAGNOSIS — M7062 Trochanteric bursitis, left hip: Secondary | ICD-10-CM

## 2023-01-01 DIAGNOSIS — Q2112 Patent foramen ovale: Secondary | ICD-10-CM

## 2023-01-01 DIAGNOSIS — M19072 Primary osteoarthritis, left ankle and foot: Secondary | ICD-10-CM

## 2023-01-01 MED ORDER — LIDOCAINE HCL 1 % IJ SOLN
0.5000 mL | INTRAMUSCULAR | Status: AC | PRN
Start: 2023-01-01 — End: 2023-01-01
  Administered 2023-01-01: .5 mL

## 2023-01-01 MED ORDER — TRIAMCINOLONE ACETONIDE 40 MG/ML IJ SUSP
10.0000 mg | INTRAMUSCULAR | Status: AC | PRN
Start: 2023-01-01 — End: 2023-01-01
  Administered 2023-01-01: 10 mg via INTRAMUSCULAR

## 2023-01-02 DIAGNOSIS — Z9884 Bariatric surgery status: Secondary | ICD-10-CM | POA: Diagnosis not present

## 2023-01-02 DIAGNOSIS — F341 Dysthymic disorder: Secondary | ICD-10-CM | POA: Diagnosis not present

## 2023-01-02 DIAGNOSIS — Z9049 Acquired absence of other specified parts of digestive tract: Secondary | ICD-10-CM | POA: Diagnosis not present

## 2023-01-02 DIAGNOSIS — Z885 Allergy status to narcotic agent status: Secondary | ICD-10-CM | POA: Diagnosis not present

## 2023-01-02 DIAGNOSIS — K316 Fistula of stomach and duodenum: Secondary | ICD-10-CM | POA: Diagnosis not present

## 2023-01-02 DIAGNOSIS — Z7901 Long term (current) use of anticoagulants: Secondary | ICD-10-CM | POA: Diagnosis not present

## 2023-01-02 DIAGNOSIS — E119 Type 2 diabetes mellitus without complications: Secondary | ICD-10-CM | POA: Diagnosis not present

## 2023-01-02 DIAGNOSIS — E039 Hypothyroidism, unspecified: Secondary | ICD-10-CM | POA: Diagnosis not present

## 2023-01-02 DIAGNOSIS — G4700 Insomnia, unspecified: Secondary | ICD-10-CM | POA: Diagnosis not present

## 2023-01-02 DIAGNOSIS — I69341 Monoplegia of lower limb following cerebral infarction affecting right dominant side: Secondary | ICD-10-CM | POA: Diagnosis not present

## 2023-01-02 DIAGNOSIS — Z791 Long term (current) use of non-steroidal anti-inflammatories (NSAID): Secondary | ICD-10-CM | POA: Diagnosis not present

## 2023-01-02 DIAGNOSIS — Z7985 Long-term (current) use of injectable non-insulin antidiabetic drugs: Secondary | ICD-10-CM | POA: Diagnosis not present

## 2023-01-02 DIAGNOSIS — K632 Fistula of intestine: Secondary | ICD-10-CM | POA: Diagnosis not present

## 2023-01-02 DIAGNOSIS — Z6841 Body Mass Index (BMI) 40.0 and over, adult: Secondary | ICD-10-CM | POA: Diagnosis not present

## 2023-01-02 DIAGNOSIS — Z90710 Acquired absence of both cervix and uterus: Secondary | ICD-10-CM | POA: Diagnosis not present

## 2023-01-02 DIAGNOSIS — Z882 Allergy status to sulfonamides status: Secondary | ICD-10-CM | POA: Diagnosis not present

## 2023-01-02 DIAGNOSIS — E669 Obesity, unspecified: Secondary | ICD-10-CM | POA: Diagnosis not present

## 2023-01-02 DIAGNOSIS — Z888 Allergy status to other drugs, medicaments and biological substances status: Secondary | ICD-10-CM | POA: Diagnosis not present

## 2023-01-02 DIAGNOSIS — Z79890 Hormone replacement therapy (postmenopausal): Secondary | ICD-10-CM | POA: Diagnosis not present

## 2023-01-09 DIAGNOSIS — E1159 Type 2 diabetes mellitus with other circulatory complications: Secondary | ICD-10-CM | POA: Diagnosis not present

## 2023-01-09 DIAGNOSIS — D6851 Activated protein C resistance: Secondary | ICD-10-CM | POA: Diagnosis not present

## 2023-01-09 DIAGNOSIS — N16 Renal tubulo-interstitial disorders in diseases classified elsewhere: Secondary | ICD-10-CM | POA: Diagnosis not present

## 2023-01-09 DIAGNOSIS — E7211 Homocystinuria: Secondary | ICD-10-CM | POA: Diagnosis not present

## 2023-01-09 DIAGNOSIS — Z6841 Body Mass Index (BMI) 40.0 and over, adult: Secondary | ICD-10-CM | POA: Diagnosis not present

## 2023-01-09 DIAGNOSIS — F325 Major depressive disorder, single episode, in full remission: Secondary | ICD-10-CM | POA: Diagnosis not present

## 2023-01-09 DIAGNOSIS — I6930 Unspecified sequelae of cerebral infarction: Secondary | ICD-10-CM | POA: Diagnosis not present

## 2023-01-14 DIAGNOSIS — M545 Low back pain, unspecified: Secondary | ICD-10-CM | POA: Diagnosis not present

## 2023-01-17 ENCOUNTER — Ambulatory Visit: Payer: PPO | Admitting: Neurology

## 2023-01-18 ENCOUNTER — Other Ambulatory Visit (INDEPENDENT_AMBULATORY_CARE_PROVIDER_SITE_OTHER): Payer: Self-pay | Admitting: Gastroenterology

## 2023-01-18 DIAGNOSIS — N16 Renal tubulo-interstitial disorders in diseases classified elsewhere: Secondary | ICD-10-CM | POA: Diagnosis not present

## 2023-01-18 DIAGNOSIS — Z9884 Bariatric surgery status: Secondary | ICD-10-CM

## 2023-01-18 DIAGNOSIS — F902 Attention-deficit hyperactivity disorder, combined type: Secondary | ICD-10-CM | POA: Diagnosis not present

## 2023-01-18 DIAGNOSIS — Z6841 Body Mass Index (BMI) 40.0 and over, adult: Secondary | ICD-10-CM | POA: Diagnosis not present

## 2023-01-18 DIAGNOSIS — K219 Gastro-esophageal reflux disease without esophagitis: Secondary | ICD-10-CM

## 2023-01-18 NOTE — Telephone Encounter (Signed)
Last OV 10/30/22 note states to continue ppi bid and reglan bid and follow up in 50month.she does a have a 4 month follow up scheduled in February.

## 2023-02-07 DIAGNOSIS — M5416 Radiculopathy, lumbar region: Secondary | ICD-10-CM | POA: Diagnosis not present

## 2023-02-07 DIAGNOSIS — M47816 Spondylosis without myelopathy or radiculopathy, lumbar region: Secondary | ICD-10-CM | POA: Diagnosis not present

## 2023-02-07 DIAGNOSIS — G894 Chronic pain syndrome: Secondary | ICD-10-CM | POA: Diagnosis not present

## 2023-02-07 DIAGNOSIS — M79671 Pain in right foot: Secondary | ICD-10-CM | POA: Diagnosis not present

## 2023-02-18 DIAGNOSIS — H2513 Age-related nuclear cataract, bilateral: Secondary | ICD-10-CM | POA: Diagnosis not present

## 2023-02-18 DIAGNOSIS — H524 Presbyopia: Secondary | ICD-10-CM | POA: Diagnosis not present

## 2023-02-18 DIAGNOSIS — E119 Type 2 diabetes mellitus without complications: Secondary | ICD-10-CM | POA: Diagnosis not present

## 2023-02-20 ENCOUNTER — Other Ambulatory Visit (INDEPENDENT_AMBULATORY_CARE_PROVIDER_SITE_OTHER): Payer: Self-pay | Admitting: Gastroenterology

## 2023-03-04 ENCOUNTER — Ambulatory Visit (INDEPENDENT_AMBULATORY_CARE_PROVIDER_SITE_OTHER): Payer: HMO | Admitting: Gastroenterology

## 2023-03-04 ENCOUNTER — Encounter (INDEPENDENT_AMBULATORY_CARE_PROVIDER_SITE_OTHER): Payer: Self-pay | Admitting: Gastroenterology

## 2023-03-04 VITALS — BP 104/71 | HR 103 | Temp 97.1°F | Ht 65.0 in | Wt 265.9 lb

## 2023-03-04 DIAGNOSIS — K5900 Constipation, unspecified: Secondary | ICD-10-CM | POA: Diagnosis not present

## 2023-03-04 DIAGNOSIS — K921 Melena: Secondary | ICD-10-CM | POA: Diagnosis not present

## 2023-03-04 DIAGNOSIS — R142 Eructation: Secondary | ICD-10-CM

## 2023-03-04 DIAGNOSIS — K219 Gastro-esophageal reflux disease without esophagitis: Secondary | ICD-10-CM

## 2023-03-04 DIAGNOSIS — K5903 Drug induced constipation: Secondary | ICD-10-CM

## 2023-03-04 MED ORDER — OMEPRAZOLE 40 MG PO CPDR: 40.0000 mg | DELAYED_RELEASE_CAPSULE | Freq: Two times a day (BID) | ORAL | 3 refills | Status: DC

## 2023-03-04 NOTE — Progress Notes (Addendum)
 Referring Provider: Avis Epley, PA* Primary Care Physician:  Avis Epley, PA-C Primary GI Physician: Dr. Levon Hedger   Chief Complaint  Patient presents with   Melena    Patient here for a follow up. Has concerns about having black stools since having surgery back in November. Having some fatigue and sob at  night.    HPI:   Debbie Pineda is a 63 y.o. 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  Reports still having some darker stools occurring a few times a week.  Noted bright red blood per rectum.  Having a bowel movement every other day usually symptoms and go up to 3 days at a BM.  Using Movantik, Colace.  Taking about 4 oxycodone per day for chronic pain.  Has some nausea at times, still doing really twice daily, appetite fair.  No GERD symptoms.  Recommended continue PPI twice daily, famotidine at bedtime, keep scheduled EGD November, continue Reglan twice daily, continue p.o. iron daily, continue Movantik, continue stool softeners, avoid NSAIDs  Repeat EGD done in November and again in December, as outlined below  Patient advised to increase omeprazole to 40 mg twice daily, take open capsules, start Carafate 1 g 3-4 times daily x 1 month.  Recommended to have upper GI series in 4 weeks to assess for persistent fistula, at which time can consider another attempt at endoscopic revision versus referral to surgery if fistula persist  Present: Patient states she was previously having dark stools on celebrex which she stopped as advised. Dark stools stopped thereafter but She  reports that after her last EGD she has had black stools since then. She has UGI series scheduled. She has not discussed this with her GI doctor at [facility]. She is still taking movantik, clearlax and occasionally takes colace 240mg . Occasionally will take dulcolax. She has some fatigue but denies sob or dizziness. She notes some orthopnea sometimes when she first lays down for bed but this usually passes after a few minutes. She has some mid abdominal discomfort at times, where she cannot stand her pants squeezing her abdomen.   She does note some belching after eating and sometimes has to go lay down to help alleviate this Denies nausea or vomiting. She denies heartburn or acid regurgitation. She is doing omeprazole 40mg  BID. Notes she did not take the full course of carafate as prescribed after her last EGD, thinks she took this consistently for about 2 weeks. Still taking PO iron BID.    Last EGD: 12/2022 normal esophagus, gastric bypass with normal-sized pouch and disrupted staple line and large GG fistula.  Endoscopic suturing performed as per above, normal examined jejunum, no specimens collected  EGD: 11/2022 - Normal esophagus.                         - Roux-en-Y gastrojejunostomy with gastrojejunal                         anastomosis.                         -  Gastrogastric fistula was seen from previous EDGE                         procedure.                         - Normal duodenal bulb, second portion of the duodenum                         and major papilla. No residual adenomatous changes                         seen at the prior ampullectomy site.                         - Normal examined jejunal limn.                         - No specimens collected.   CT A/P with contrast 09/06/21 Prominent stool throughout the colon favors constipation. 2. Other imaging findings of potential clinical significance: Small right renal angiomyolipoma. Pneumobilia. Prior cholecystectomy. Prior gastric  bypass. Extensive postoperative findings in the thoracolumbar spine.   ERCP: 06/02/20: No specimens collected.  - Gastric bypass. Gastrojejunal anastomosis characterized by healthy appearing mucosa. - Pre-existing gastrogastrostomy AXIOS stent. Removed and replaced with two 10Fr x 4cm double pigtail stents to maintain patency (given the anticipated need for  ampullary surveillance in the future). - A single ampullary polyp (adenomatous) Snare papillectomy of the  major papilla was performed. Resected and retrieved  with hemostasis achieved through the use of snare tip soft coagulation. - The entire main bile duct was mildly dilated. acquired. - A biliary sphincterotomy was performed.  - The biliary tree was swept and sludge was found. (Recommended repeat EGD in 6-12 months, pending path)                     Last Colonoscopy: march 2015, normal, small eternal hemorrhoids repeat in 10 years   Past Medical History:  Diagnosis Date   ADD (attention deficit disorder)    Arthritis    "all over"   Cerebral infarction (HCC) 10/30/2011   Cerebrovascular disease 08/14/2016   Chronic back pain    "all over"   Chronic low back pain 01/11/2016   Complication of anesthesia    tends to have hypotension when NPO and post-anesthesia   Constipation    takes stool softener daily   Degenerative disk disease    Degenerative joint disease    DVT (deep venous thrombosis) (HCC) 2014   RLE   Family history of adverse reaction to anesthesia    a family member woke up during surgery; "think it was my mom"   Fibromyalgia    Generalized osteoarthritis of multiple sites 11/04/2013   GERD (gastroesophageal reflux disease)    Headache    Heart palpitations 12/14/2013   resolved   History of blood clots    superficial   Hypoglycemia    Hypothyroid    takes Synthroid daily   Incomplete emptying of bladder    Insomnia    takes Trazodone nightly   Iron deficiency anemia    takes Ferrous Sulfate daily   Joint  pain    Joint swelling    knees and ankles   Memory disorder 08/14/2016   Morbid obesity (HCC)  Nausea    takes Zofran as needed.Seeing GI doc   Neck pain 05/25/2016   OSA on CPAP    tested more than 5 yrs ago.     Osteoarthritis    Osteopenia    in feet   PFO (patent foramen ovale)    no murmur   PFO (patent foramen ovale)    Pre-diabetes    Primary osteoarthritis of both feet 05/29/2016   Right bunionectomy August 2017 by Dr. Lajoyce Corners   PVC's (premature ventricular contractions) 10/11/2021   Monitor 9/23: 1.8% PVCs; rare PACs; o/w NSR   Scoliosis    Skin abnormality 02/04/2020   raised area on lip   Stroke [facility]) "several"last 2014   right foot weakness; memory issues, black spot right visual field since" (03/23/2015)   Thrombophlebitis    Trochanteric bursitis of both hips 05/25/2016   Unilateral primary osteoarthritis, right knee 05/29/2016   Urinary urgency 04/19/2016   Vein disorder 11/29/2010   varicose veins both legs   Wears glasses     Past Surgical History:  Procedure Laterality Date   BIOPSY  11/14/2018   Procedure: BIOPSY;  Surgeon: Malissa Hippo, MD;  [facility];  Service: Endoscopy;;  esophagusgastric   BIOPSY  06/07/2022   Procedure: BIOPSY;  Surgeon: Dolores Frame, MD;  [facility];  Service: Gastroenterology;;   BONE EXCISION Right 08/29/2017   Procedure: right trapezium excision;  Surgeon: Cindee Salt, MD;  Location: [facility];  Service: Orthopedics;  Laterality: Right;   BUNIONECTOMY Right 08/2015   CARDIAC CATHETERIZATION     2008.  "it was fine" (not sure why she had it done, and doesn't know where)   CARPOMETACARPEL SUSPENSION PLASTY Right 08/29/2017   Procedure: SUSPENSION PLASTY RIGHT THUMB;  Surgeon: Cindee Salt, MD;  Location:  SURGERY CENTER;  Service: Orthopedics;  Laterality: Right;   COLONOSCOPY N/A 03/25/2013   Procedure: COLONOSCOPY;  Surgeon: Malissa Hippo, MD;  [facility];  Service: Endoscopy;  Laterality: N/A;  930   ERCP  06/02/2020   ESOPHAGOGASTRODUODENOSCOPY     ESOPHAGOGASTRODUODENOSCOPY (EGD) WITH PROPOFOL N/A 11/14/2018   Procedure: ESOPHAGOGASTRODUODENOSCOPY (EGD) WITH PROPOFOL;  Surgeon: Malissa Hippo, MD;  [facility];  Service: Endoscopy;  Laterality: N/A;  1:55pm-office moved to 11:00am/pt notified to arrive at 9:30am per KF   ESOPHAGOGASTRODUODENOSCOPY (EGD) WITH PROPOFOL N/A 06/07/2022   Procedure: ESOPHAGOGASTRODUODENOSCOPY (EGD) WITH PROPOFOL;  Surgeon: Dolores Frame, MD;  [facility];  Service: Gastroenterology;  Laterality: N/A;  2:00pm;asa 3   EXPLORATORY LAPAROTOMY     "took fallopian tubes out"   HALLUX FUSION Right 02/12/2020   Procedure: HALLUX INTERPHANGEAL JOINT  FUSION;  Surgeon: Candelaria Stagers, DPM;  Location:  SURGERY CENTER;  Service: Podiatry;  Laterality: Right;   HAMMER TOE SURGERY Right 02/12/2020   Procedure: SECOND AND THIRD HAMMER TOE CORRECTION; CAPSULOTOMY SECOND INTERPHALANGEAL JOINT;  Surgeon: Candelaria Stagers, DPM;  Location:  SURGERY CENTER;  Service: Podiatry;  Laterality: Right;   JOINT REPLACEMENT     bil knee    KNEE ARTHROSCOPY Left    KNEE ARTHROSCOPY W/ ACL RECONSTRUCTION Right yrs ago   "added pins"   LAPAROSCOPIC CHOLECYSTECTOMY  ~ 2001   ROUX-EN-Y GASTRIC BYPASS  11/20/2010   [place]   SPINAL CORD STIMULATOR INSERTION N/A 04/18/2017   Procedure: LUMBAR SPINAL CORD STIMULATOR INSERTION;  Surgeon: Odette Fraction, MD;  [facility];  Service: Neurosurgery;  Laterality: N/A;  LUMBAR SPINAL CORD STIMULATOR INSERTION   stomach stent  04/28/2020   TENDON TRANSFER Right 08/29/2017   Procedure: right abductor pollicis longus transfer;  Surgeon: Cindee Salt, MD;  Location: [facility];  Service: Orthopedics;  Laterality: Right;   TOTAL KNEE ARTHROPLASTY Left 03/23/2015   Procedure: TOTAL KNEE ARTHROPLASTY;  Surgeon: Nadara Mustard, MD;  Location: [facility];  Service: Orthopedics;  Laterality: Left;   TOTAL KNEE ARTHROPLASTY Right 08/15/2016   Procedure: RIGHT TOTAL KNEE ARTHROPLASTY, REMOVAL ACL SCREWS;  Surgeon: Nadara Mustard, MD;  Location: [facility];  Service: Orthopedics;  Laterality: Right;   TOTAL KNEE ARTHROPLASTY WITH HARDWARE REMOVAL Right    UPPER GI ENDOSCOPY  12/06/2022   VAGINAL HYSTERECTOMY     tah/bso   VARICOSE VEIN SURGERY Right X 2   WEIL OSTEOTOMY Right 02/12/2020   Procedure: DOUBLE L OSTEOTOMY;  Surgeon: Candelaria Stagers, DPM;  Location: [facility];  Service: Podiatry;  Laterality: Right;    Current Outpatient Medications  Medication Sig Dispense Refill   baclofen (LIORESAL) 10 MG tablet Take 1 tablet (10 mg total) by mouth 3 (three) times daily. 90 each 0   Biotin 5000 MCG CAPS daily at 6 (six) AM.     Blood Glucose Monitoring Suppl (FREESTYLE PRECISION NEO SYSTEM) w/Device KIT      Cholecalciferol (VITAMIN D3) 125 MCG (5000 UT) CAPS daily at 6 (six) AM.     Cyanocobalamin (VITAMIN B-12) 5000 MCG SUBL daily at 6 (six) AM.     diclofenac sodium (VOLTAREN) 1 % GEL Apply 4 g topically 4 (four) times daily as needed (PAIN). 5 Tube 1   DULoxetine (CYMBALTA) 60 MG capsule Take 60 mg by mouth 2 (two) times daily.     famotidine (PEPCID) 40 MG tablet Take 1 tablet by mouth at bedtime. (Patient taking differently: Take 40 mg by mouth 2 (two) times daily.) 90 tablet 2   ferrous gluconate (FERGON) 324 MG tablet Take 1 tablet (324 mg total) by mouth 2 (two) times daily with a meal. 60 tablet 0   folic acid (FOLVITE) 400 MCG tablet Take 400 mcg by mouth daily.     gabapentin (NEURONTIN) 800 MG tablet 800 mg. One tid     Galcanezumab-gnlm (EMGALITY) 120 MG/ML SOSY INJECT 1 ML SUBCUTANEOUSLY  ONCE EVERY MONTH 1 mL 11   Krill Oil 350 MG CAPS Take 350 mg by mouth at bedtime.      levothyroxine (SYNTHROID) 125 MCG tablet Take 1 tablet (125 mcg total) by mouth daily before breakfast. 30 tablet 0    Methylfol-Methylcob-Acetylcyst (METAFOLBIC PLUS) 6-2-600 MG TABS Take 1 tablet by mouth daily. 30 tablet 11   methylphenidate (CONCERTA) 27 MG PO CR tablet Take 1 tablet (27 mg total) by mouth 2 (two) times daily. 10 am & 1400 60 tablet 0   metoCLOPramide (REGLAN) 5 MG tablet Take 1 tablet by mouth twice daily. 60 tablet 2   miconazole (ZEASORB-AF) 2 % powder Every shift as needed. Apply to red areas in folds of skin.     mirabegron ER (MYRBETRIQ) 50 MG TB24 tablet Take 1 tablet (50 mg total) by mouth daily. 30 tablet 0   Multiple Minerals-Vitamins (CAL-MAG-ZINC-D PO) Take 3 tablets by mouth daily. 333 mg-133 unit -133 mg-5 mg     Multiple Vitamins-Minerals (ONE-A-DAY WOMENS PETITES) TABS Take 1 tablet by mouth 2 (two) times daily.     naloxegol oxalate (MOVANTIK) 25 MG TABS tablet TAKE (1)  TABLET BY MOUTH ONCE DAILY. 30 tablet 3   NARCAN 4 MG/0.1ML LIQD nasal spray kit Place 0.1 sprays (0.4 mg total) into the nose 2 (two) times daily as needed. 1 each 0   NUCYNTA ER 100 MG 12 hr tablet SMARTSIG:1 Tablet(s) By Mouth Every 12 Hours     omeprazole (PRILOSEC) 40 MG capsule Take 1 capsule by mouth twice daily. 60 capsule 0   ondansetron (ZOFRAN-ODT) 4 MG disintegrating tablet Take 1 tablet (4 mg total) by mouth every 8 (eight) hours as needed for nausea or vomiting. 20 tablet 0   ONETOUCH ULTRA test strip      OneTouch UltraSoft 2 Lancets MISC      Oxycodone HCl 10 MG TABS Take 10 mg by mouth every 6 (six) hours as needed.     Rimegepant Sulfate (NURTEC) 75 MG TBDP DISSOLVE 1 TABLET BY MOUTH ONCE DAILY AS NEEDED, max is 1 tablet daily 8 tablet 11   rivaroxaban (XARELTO) 20 MG TABS tablet Take 1 tablet (20 mg total) by mouth daily at 6 PM. 30 tablet 0   Semaglutide (OZEMPIC, 0.25 OR 0.5 MG/DOSE, [place]) Inject 0.25 mg into the skin once a week.     tiZANidine (ZANAFLEX) 4 MG tablet Take 1 tablet (4 mg total) by mouth every 6 (six) hours as needed for muscle spasms. 30 tablet 0   topiramate (TOPAMAX) 100 MG  tablet Take 1 tablet (100 mg total) by mouth 2 (two) times daily. 180 tablet 3   traZODone (DESYREL) 100 MG tablet Take 0.5 tablets (50 mg total) by mouth at bedtime. 15 tablet 0   triamcinolone cream (KENALOG) 0.1 % SMARTSIG:1 Application Topical 2-3 Times Daily     trospium (SANCTURA) 20 MG tablet Take 1 tablet (20 mg total) by mouth 2 (two) times daily. 60 tablet 0   vitamin C (ASCORBIC ACID) 500 MG tablet Take 500 mg by mouth daily.     No current facility-administered medications for this visit.    Allergies as of 03/04/2023 - Review Complete 03/04/2023  Allergen Reaction Noted   Lyrica [pregabalin] Shortness Of Breath and Swelling 12/01/2015   Belsomra [suvorexant] Other (See Comments) 12/01/2015   Morphine and codeine Itching 10/26/2010   Sulfamethoxazole-trimethoprim Itching and Rash 03/10/2015   Tape Itching and Rash 03/10/2015    Family History  Problem Relation Age of Onset   Cancer Mother        skin and breast   Myasthenia gravis Mother    Breast cancer Mother    Heart disease Father    Cancer Father        brain   Parkinson's disease Father    Heart disease Sister    Heart attack Sister    Heart disease Brother    Cancer Brother    Diabetes Brother    Stroke Brother    Cancer Maternal Grandfather    Hypothyroidism Daughter    Hypertension Other    Colon cancer Neg Hx     Social History   Socioeconomic History   Marital status: Married    Spouse name: Debbie Pineda   Number of children: 1   Years of education: 14   Highest education level: Not on file  Occupational History   Occupation: Disability   Occupation: formerly Charity fundraiser, [employer]  Tobacco Use   Smoking status: Former    Current packs/day: 0.00    Average packs/day: 0.8 packs/day for 8.0 years (6.0 ttl pk-yrs)    Types: Cigarettes    Start date:  12/01/1982    Quit date: 12/01/1990    Years since quitting: 32.2    Passive exposure: Past   Smokeless tobacco: Never  Vaping Use   Vaping status: Never Used   Substance and Sexual Activity   Alcohol use: No    Comment: 03/23/2015 "stopped drinking in 2012 w/gastric bypass; drank socially before bypass"   Drug use: No   Sexual activity: Not Currently    Birth control/protection: Surgical  Other Topics Concern   Not on file  Social History Narrative   Lives with husband   Caffeine use: No soda   Mainly water, drinks decaf tea   Right handed   Social Drivers of Corporate investment banker Strain: Not on file  Food Insecurity: Not on file  Transportation Needs: Not on file  Physical Activity: Not on file  Stress: Not on file  Social Connections: Not on file    Review of systems General: negative for night sweats, fever, chills, weight loss +fatigue  Neck: Negative for lumps, goiter, pain and significant neck swelling Resp: Negative for cough, wheezing, dyspnea at rest CV: Negative for chest pain, leg swelling, palpitations, orthopnea GI: denies hematochezia, nausea, vomiting, diarrhea, constipation, dysphagia, odyonophagia, early satiety or unintentional weight loss.  +melena +belching +mid abdominal discomfort +bloating  The remainder of the review of systems is noncontributory.  Physical Exam: BP 104/71   Pulse (!) 103   Temp (!) 97.1 F (36.2 C)   Ht 5\' 5"  (1.651 m)   Wt 265 lb 14.4 oz (120.6 kg)   BMI 44.25 kg/m  General:   Alert and oriented. No distress noted. Pleasant and cooperative.  Head:  Normocephalic and atraumatic. Eyes:  Conjuctiva clear without scleral icterus. Mouth:  Oral mucosa pink and moist. Good dentition. No lesions. Heart: Normal rate and rhythm, s1 and s2 heart sounds present.  Lungs: Clear lung sounds in all lobes. Respirations equal and unlabored. Abdomen:  +BS, soft, non-tender and non-distended. No rebound or guarding. No HSM or masses noted. Neurologic:  Alert and  oriented x4 Psych:  Alert and cooperative. Normal mood and affect.  Invalid input(s): "6 MONTHS"   ASSESSMENT: JUNI GLAAB  is a 63 y.o. female presenting today for follow up of melena, GERD and constipation  Melena: having darker stools since her EGD at [facility]. She did not take full course of carafate as advised. Endorses some sob and fatigue. Will update iron studies and CBC, I encouraged her to call [facility] to let them know about ongoing melena since last procedure with them. She should also resume carafate to complete full course she was given and continue her PO iron at this time.   GERD/Belching: Maintained on omeprazole 40 mg twice daily as well as famotidine 40 mg twice daily.  She notes some belching after eating and sometimes has to go lay down to alleviate her symptoms.  Denies any heartburn, acid regurgitation, nausea, vomiting. Has some mid abdominal pain/bloating at times as well. Given recent EGD that was grossly normal, may consider SIBO testing if her symptoms persists.   Constipation: Well-managed on ClearLax, Colace as needed, Movantik daily.  Will continue on current regimen   PLAN:  -iron studies and CBC -reach out to your doctor at [facility] regarding darker stools  -Continue with iron pills for now -continue omeprazole and famotidine twice daily, good reflux precautions  -Continue movantik and clearlax/colace as you are doing for constipation  -Complete the course of carafate you were prescribed by [facility]  -  may consider SIBO testing if belching/bloating continues   All questions were answered, patient verbalized understanding and is in agreement with plan as outlined above.    Follow Up: 4 months   Jaydon Avina L. Jeanmarie Hubert, MSN, APRN, AGNP-C Adult-Gerontology Nurse Practitioner [facility]  I have reviewed the note and agree with the APP's assessment as described in this progress note  Patient will be due for repeat screening colonoscopy next month, will update recall  Katrinka Blazing, MD Gastroenterology and Hepatology [facility]

## 2023-03-04 NOTE — Patient Instructions (Signed)
 We will update labs to include iron  levels and CBC Please reach out to your doctor at [facility] regarding darker stools as well Continue with iron  pills for now continue omeprazole  and famotidine  twice daily Continue movantik  and clearlax/colace as you are doing for constipation  Complete the course of carafate you were prescribed by [facility]   Follow up 4 months

## 2023-03-05 ENCOUNTER — Encounter (INDEPENDENT_AMBULATORY_CARE_PROVIDER_SITE_OTHER): Payer: Self-pay

## 2023-03-05 LAB — IRON,TIBC AND FERRITIN PANEL
%SAT: 28 % (ref 16–45)
Ferritin: 108 ng/mL (ref 16–232)
Iron: 84 ug/dL (ref 45–160)
TIBC: 296 ug/dL (ref 250–450)

## 2023-03-05 LAB — CBC
HCT: 43 % (ref 35.0–45.0)
Hemoglobin: 13.9 g/dL (ref 11.7–15.5)
MCH: 31.2 pg (ref 27.0–33.0)
MCHC: 32.3 g/dL (ref 32.0–36.0)
MCV: 96.6 fL (ref 80.0–100.0)
MPV: 10.3 fL (ref 7.5–12.5)
Platelets: 276 10*3/uL (ref 140–400)
RBC: 4.45 10*6/uL (ref 3.80–5.10)
RDW: 11.8 % (ref 11.0–15.0)
WBC: 6.6 10*3/uL (ref 3.8–10.8)

## 2023-03-08 ENCOUNTER — Encounter (INDEPENDENT_AMBULATORY_CARE_PROVIDER_SITE_OTHER): Payer: Self-pay | Admitting: *Deleted

## 2023-03-11 DIAGNOSIS — K316 Fistula of stomach and duodenum: Secondary | ICD-10-CM | POA: Diagnosis not present

## 2023-03-12 DIAGNOSIS — R142 Eructation: Secondary | ICD-10-CM | POA: Insufficient documentation

## 2023-03-19 ENCOUNTER — Other Ambulatory Visit (INDEPENDENT_AMBULATORY_CARE_PROVIDER_SITE_OTHER): Payer: Self-pay | Admitting: Gastroenterology

## 2023-03-19 ENCOUNTER — Telehealth (INDEPENDENT_AMBULATORY_CARE_PROVIDER_SITE_OTHER): Payer: Self-pay

## 2023-03-19 DIAGNOSIS — K219 Gastro-esophageal reflux disease without esophagitis: Secondary | ICD-10-CM

## 2023-03-19 MED ORDER — FAMOTIDINE 40 MG PO TABS: 40.0000 mg | ORAL_TABLET | Freq: Every day | ORAL | 1 refills | Status: DC

## 2023-03-19 NOTE — Telephone Encounter (Signed)
 Patient needing a refill on her Famotidine 40 mg once at bed time. She needs this sent to [facility]. I called the patient to inquire how she was taking this and she says she is taking once per day, but last ov states she was taking bid. Please advise or refill. Thanks,

## 2023-03-22 DIAGNOSIS — F902 Attention-deficit hyperactivity disorder, combined type: Secondary | ICD-10-CM | POA: Diagnosis not present

## 2023-03-22 DIAGNOSIS — D6851 Activated protein C resistance: Secondary | ICD-10-CM | POA: Diagnosis not present

## 2023-03-22 DIAGNOSIS — D6869 Other thrombophilia: Secondary | ICD-10-CM | POA: Diagnosis not present

## 2023-03-28 DIAGNOSIS — M79671 Pain in right foot: Secondary | ICD-10-CM | POA: Diagnosis not present

## 2023-03-28 DIAGNOSIS — G894 Chronic pain syndrome: Secondary | ICD-10-CM | POA: Diagnosis not present

## 2023-03-28 DIAGNOSIS — M5416 Radiculopathy, lumbar region: Secondary | ICD-10-CM | POA: Diagnosis not present

## 2023-03-28 DIAGNOSIS — M47816 Spondylosis without myelopathy or radiculopathy, lumbar region: Secondary | ICD-10-CM | POA: Diagnosis not present

## 2023-03-28 DIAGNOSIS — Z79891 Long term (current) use of opiate analgesic: Secondary | ICD-10-CM | POA: Diagnosis not present

## 2023-03-29 DIAGNOSIS — G4733 Obstructive sleep apnea (adult) (pediatric): Secondary | ICD-10-CM | POA: Diagnosis not present

## 2023-04-03 ENCOUNTER — Other Ambulatory Visit: Payer: Self-pay | Admitting: Family Medicine

## 2023-04-03 DIAGNOSIS — Z1231 Encounter for screening mammogram for malignant neoplasm of breast: Secondary | ICD-10-CM

## 2023-04-04 DIAGNOSIS — Z6841 Body Mass Index (BMI) 40.0 and over, adult: Secondary | ICD-10-CM | POA: Diagnosis not present

## 2023-04-04 DIAGNOSIS — Z1331 Encounter for screening for depression: Secondary | ICD-10-CM | POA: Diagnosis not present

## 2023-04-04 DIAGNOSIS — E1159 Type 2 diabetes mellitus with other circulatory complications: Secondary | ICD-10-CM | POA: Diagnosis not present

## 2023-04-04 DIAGNOSIS — Z0001 Encounter for general adult medical examination with abnormal findings: Secondary | ICD-10-CM | POA: Diagnosis not present

## 2023-04-05 ENCOUNTER — Ambulatory Visit
Admission: RE | Admit: 2023-04-05 | Discharge: 2023-04-05 | Disposition: A | Discharge status: Home / Self Care | Source: Ambulatory Visit | Attending: Family Medicine | Admitting: Family Medicine

## 2023-04-05 DIAGNOSIS — E039 Hypothyroidism, unspecified: Secondary | ICD-10-CM | POA: Diagnosis not present

## 2023-04-05 DIAGNOSIS — Z0001 Encounter for general adult medical examination with abnormal findings: Secondary | ICD-10-CM | POA: Diagnosis not present

## 2023-04-05 DIAGNOSIS — Z1231 Encounter for screening mammogram for malignant neoplasm of breast: Secondary | ICD-10-CM

## 2023-04-05 DIAGNOSIS — E559 Vitamin D deficiency, unspecified: Secondary | ICD-10-CM | POA: Diagnosis not present

## 2023-04-18 ENCOUNTER — Other Ambulatory Visit (INDEPENDENT_AMBULATORY_CARE_PROVIDER_SITE_OTHER): Payer: Self-pay | Admitting: Gastroenterology

## 2023-04-18 ENCOUNTER — Other Ambulatory Visit (INDEPENDENT_AMBULATORY_CARE_PROVIDER_SITE_OTHER): Payer: Self-pay

## 2023-04-18 DIAGNOSIS — K219 Gastro-esophageal reflux disease without esophagitis: Secondary | ICD-10-CM

## 2023-04-18 DIAGNOSIS — Z9884 Bariatric surgery status: Secondary | ICD-10-CM

## 2023-04-18 MED ORDER — METOCLOPRAMIDE HCL 5 MG PO TABS: 5.0000 mg | ORAL_TABLET | Freq: Two times a day (BID) | ORAL | 2 refills | Status: DC

## 2023-04-18 NOTE — Telephone Encounter (Signed)
 Last seen 2/10 and note states to continue reglan bid

## 2023-04-18 NOTE — Telephone Encounter (Signed)
 Needs refill on Metoclopramide sent to [facility]. Last seen by [facility] 03/04/2023.

## 2023-04-22 DIAGNOSIS — Z9884 Bariatric surgery status: Secondary | ICD-10-CM | POA: Diagnosis not present

## 2023-04-22 DIAGNOSIS — G4700 Insomnia, unspecified: Secondary | ICD-10-CM | POA: Diagnosis not present

## 2023-04-22 DIAGNOSIS — F341 Dysthymic disorder: Secondary | ICD-10-CM | POA: Diagnosis not present

## 2023-04-22 DIAGNOSIS — Z90710 Acquired absence of both cervix and uterus: Secondary | ICD-10-CM | POA: Diagnosis not present

## 2023-04-22 DIAGNOSIS — K316 Fistula of stomach and duodenum: Secondary | ICD-10-CM | POA: Diagnosis not present

## 2023-04-22 DIAGNOSIS — Z882 Allergy status to sulfonamides status: Secondary | ICD-10-CM | POA: Diagnosis not present

## 2023-04-22 DIAGNOSIS — Z885 Allergy status to narcotic agent status: Secondary | ICD-10-CM | POA: Diagnosis not present

## 2023-04-22 DIAGNOSIS — E119 Type 2 diabetes mellitus without complications: Secondary | ICD-10-CM | POA: Diagnosis not present

## 2023-04-22 DIAGNOSIS — Z79899 Other long term (current) drug therapy: Secondary | ICD-10-CM | POA: Diagnosis not present

## 2023-04-25 DIAGNOSIS — G894 Chronic pain syndrome: Secondary | ICD-10-CM | POA: Diagnosis not present

## 2023-04-25 DIAGNOSIS — M5416 Radiculopathy, lumbar region: Secondary | ICD-10-CM | POA: Diagnosis not present

## 2023-04-25 DIAGNOSIS — M79671 Pain in right foot: Secondary | ICD-10-CM | POA: Diagnosis not present

## 2023-04-25 DIAGNOSIS — M47816 Spondylosis without myelopathy or radiculopathy, lumbar region: Secondary | ICD-10-CM | POA: Diagnosis not present

## 2023-05-02 DIAGNOSIS — F902 Attention-deficit hyperactivity disorder, combined type: Secondary | ICD-10-CM | POA: Diagnosis not present

## 2023-05-02 DIAGNOSIS — E1159 Type 2 diabetes mellitus with other circulatory complications: Secondary | ICD-10-CM | POA: Diagnosis not present

## 2023-05-02 DIAGNOSIS — Z6841 Body Mass Index (BMI) 40.0 and over, adult: Secondary | ICD-10-CM | POA: Diagnosis not present

## 2023-05-06 ENCOUNTER — Ambulatory Visit (HOSPITAL_COMMUNITY)
Admission: RE | Admit: 2023-05-06 | Discharge: 2023-05-06 | Disposition: A | Discharge status: Home / Self Care | Source: Ambulatory Visit | Attending: Internal Medicine | Admitting: Internal Medicine

## 2023-05-06 ENCOUNTER — Other Ambulatory Visit (HOSPITAL_COMMUNITY): Payer: Self-pay | Admitting: Internal Medicine

## 2023-05-06 DIAGNOSIS — R109 Unspecified abdominal pain: Secondary | ICD-10-CM

## 2023-05-06 DIAGNOSIS — M545 Low back pain, unspecified: Secondary | ICD-10-CM | POA: Diagnosis not present

## 2023-05-06 DIAGNOSIS — E1159 Type 2 diabetes mellitus with other circulatory complications: Secondary | ICD-10-CM | POA: Diagnosis not present

## 2023-05-06 DIAGNOSIS — D6851 Activated protein C resistance: Secondary | ICD-10-CM | POA: Diagnosis not present

## 2023-05-06 DIAGNOSIS — Z6841 Body Mass Index (BMI) 40.0 and over, adult: Secondary | ICD-10-CM | POA: Diagnosis not present

## 2023-05-06 DIAGNOSIS — Z90710 Acquired absence of both cervix and uterus: Secondary | ICD-10-CM | POA: Diagnosis not present

## 2023-05-06 DIAGNOSIS — M5414 Radiculopathy, thoracic region: Secondary | ICD-10-CM | POA: Diagnosis not present

## 2023-05-06 DIAGNOSIS — Z9049 Acquired absence of other specified parts of digestive tract: Secondary | ICD-10-CM | POA: Diagnosis not present

## 2023-05-06 DIAGNOSIS — D6869 Other thrombophilia: Secondary | ICD-10-CM | POA: Diagnosis not present

## 2023-05-06 DIAGNOSIS — I6930 Unspecified sequelae of cerebral infarction: Secondary | ICD-10-CM | POA: Diagnosis not present

## 2023-05-20 ENCOUNTER — Encounter (INDEPENDENT_AMBULATORY_CARE_PROVIDER_SITE_OTHER): Payer: Self-pay

## 2023-05-28 DIAGNOSIS — M5416 Radiculopathy, lumbar region: Secondary | ICD-10-CM | POA: Diagnosis not present

## 2023-05-28 DIAGNOSIS — G894 Chronic pain syndrome: Secondary | ICD-10-CM | POA: Diagnosis not present

## 2023-05-28 DIAGNOSIS — M79671 Pain in right foot: Secondary | ICD-10-CM | POA: Diagnosis not present

## 2023-05-28 DIAGNOSIS — M47816 Spondylosis without myelopathy or radiculopathy, lumbar region: Secondary | ICD-10-CM | POA: Diagnosis not present

## 2023-05-31 DIAGNOSIS — F902 Attention-deficit hyperactivity disorder, combined type: Secondary | ICD-10-CM | POA: Diagnosis not present

## 2023-05-31 DIAGNOSIS — Z6841 Body Mass Index (BMI) 40.0 and over, adult: Secondary | ICD-10-CM | POA: Diagnosis not present

## 2023-06-04 DIAGNOSIS — Z9884 Bariatric surgery status: Secondary | ICD-10-CM | POA: Diagnosis not present

## 2023-06-04 DIAGNOSIS — Z6841 Body Mass Index (BMI) 40.0 and over, adult: Secondary | ICD-10-CM | POA: Diagnosis not present

## 2023-06-04 DIAGNOSIS — Z8774 Personal history of (corrected) congenital malformations of heart and circulatory system: Secondary | ICD-10-CM | POA: Diagnosis not present

## 2023-06-04 DIAGNOSIS — Q2112 Patent foramen ovale: Secondary | ICD-10-CM | POA: Diagnosis not present

## 2023-06-04 DIAGNOSIS — K912 Postsurgical malabsorption, not elsewhere classified: Secondary | ICD-10-CM | POA: Diagnosis not present

## 2023-06-04 DIAGNOSIS — K316 Fistula of stomach and duodenum: Secondary | ICD-10-CM | POA: Diagnosis not present

## 2023-06-06 DIAGNOSIS — N390 Urinary tract infection, site not specified: Secondary | ICD-10-CM | POA: Diagnosis not present

## 2023-06-06 DIAGNOSIS — Z6841 Body Mass Index (BMI) 40.0 and over, adult: Secondary | ICD-10-CM | POA: Diagnosis not present

## 2023-06-19 NOTE — Progress Notes (Signed)
 Office Visit Note  Patient: Debbie Pineda             Date of Birth: 1960/07/18           MRN: [id number]             PCP: Roxene Cora, PA-C Referring: Roxene Cora, [place]* Visit Date: 07/03/2023 Occupation: @GUAROCC @  Subjective:  Medication management  History of Present Illness: Debbie Pineda is a 63 y.o. female with fibromyalgia syndrome and osteoarthritis.  She states she continues to have generalized pain and discomfort from fibromyalgia.  She has been having increased trapezius muscle spasm.  And would like to have cortisone injection.  He continues to have right trochanteric bursitis.  She states he has nocturnal pain when she sleeps on her side.  She continues to have pain and discomfort in the bilateral knee joints which are replaced.  She has been using Celebrex which helps.  She has been seeing Dr. Sulema Endo for the back pain.  She states she is been referred to physical therapy due to ongoing pain in her lower back. She is scheduled for revision of Roux-en-Y surgery on August 08, 2023.  Activities of Daily Living:  Patient reports morning stiffness for 30-60 minutes.   Patient Reports nocturnal pain.  Difficulty dressing/grooming: Denies Difficulty climbing stairs: Reports Difficulty getting out of chair: Reports Difficulty using hands for taps, buttons, cutlery, and/or writing: Denies  Review of Systems  Constitutional:  Positive for fatigue.  HENT:  Positive for mouth dryness. Negative for mouth sores.   Eyes:  Positive for dryness.  Respiratory:  Positive for shortness of breath.   Cardiovascular:  Negative for chest pain and palpitations.  Gastrointestinal:  Positive for constipation. Negative for blood in stool and diarrhea.  Endocrine: Positive for increased urination.  Genitourinary:  Positive for involuntary urination.  Musculoskeletal:  Positive for joint pain, gait problem, joint pain, joint swelling, myalgias, muscle weakness, morning stiffness,  muscle tenderness and myalgias.  Skin:  Positive for rash. Negative for color change, hair loss and sensitivity to sunlight.  Allergic/Immunologic: Negative for susceptible to infections.  Neurological:  Negative for dizziness and headaches.  Hematological:  Negative for swollen glands.  Psychiatric/Behavioral:  Positive for sleep disturbance. Negative for depressed mood. The patient is not nervous/anxious.     PMFS History:  Patient Active Problem List   Diagnosis Date Noted   Nausea without vomiting 07/02/2023   Encounter for screening colonoscopy 07/02/2023   Belching 03/12/2023   History of seizures 07/10/2022   Melena 05/15/2022   PVC's (premature ventricular contractions) 10/11/2021   Factor V Leiden mutation (HCC) 10/11/2021   Other secondary scoliosis, lumbar region    Other spondylosis with radiculopathy, lumbar region    Spondylolisthesis, lumbar region    Thoracic spondylosis with radiculopathy    Radiculopathy, lumbar region    Breakdown (mechanical) of implanted electronic neurostimulator of spinal cord electrode (lead), sequela    Degenerative disc disease, lumbar    At risk for adverse drug event 08/31/2021   Acute on chronic blood loss anemia 08/28/2021   Drug-induced constipation 08/28/2021   GERD without esophagitis 08/28/2021   Overactive bladder 08/28/2021   Chronic migraine without aura 08/28/2021   Hx of blood clots 08/28/2021   Major neurocognitive disorder (HCC) 08/28/2021   Status post lumbar spinal fusion 08/21/2021   Weight gain 07/13/2021   Encounter for removal of internal fixation device 10/21/2020   LFTs abnormal 03/02/2020   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   Gastroesophageal reflux disease 09/22/2019   SSBE (short-segment Barrett's esophagus) 09/22/2019   Flatulence 09/22/2019   Migraine 11/11/2018   Abdominal pain, epigastric 10/14/2018   LUQ pain 10/14/2018   Attention deficit hyperactivity disorder  (ADHD) 01/12/2018   Insomnia 01/12/2018   Elevated liver enzymes 09/10/2017   History of diabetes mellitus 06/06/2017   Primary osteoarthritis of right hand 06/06/2017   Transaminasemia    TIA (transient ischemic attack) 05/01/2017   Dysphasia 05/01/2017   Chronic pain syndrome 05/01/2017   Confusion    Presence of right artificial knee joint 09/17/2016   H/O total knee replacement, right 08/15/2016   Presence of retained hardware    Memory disorder 08/14/2016   Cerebrovascular disease 08/14/2016   Unilateral primary osteoarthritis, right knee 05/29/2016   Primary osteoarthritis of both feet 05/29/2016   Trochanteric bursitis of both hips 05/25/2016   Neck pain 05/25/2016   Urinary urgency 04/19/2016   Chronic low back pain 01/11/2016   Total knee replacement status 03/23/2015   Heart palpitations 12/14/2013   Generalized osteoarthritis of multiple sites 11/04/2013   Elevated LFTs 03/17/2012   Cerebral infarction, unspecified (HCC) 11/29/2011   Cerebral infarction (HCC) 10/30/2011   PFO (patent foramen ovale) 10/30/2011   Acquired hypothyroidism    Thrombophlebitis    OSA on CPAP    Fibromyalgia    Status post bariatric surgery 12/07/2010   Vein disorder 11/29/2010   S/P total hysterectomy and bilateral salpingo-oophorectomy 11/29/2010   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   S/P cholecystectomy 11/29/2010   S/P ACL surgery 11/29/2010   Morbid obesity (HCC) 11/10/2010    Past Medical History:  Diagnosis Date   ADD (attention deficit disorder)    Arthritis    all over   Cerebral infarction (HCC) 10/30/2011   Cerebrovascular disease 08/14/2016   Chronic back pain    all over   Chronic low back pain 01/11/2016   Complication of anesthesia    tends to have hypotension when NPO and post-anesthesia   Constipation    takes stool softener daily   Degenerative disk disease    Degenerative joint disease    DVT (deep venous thrombosis) (HCC) 2014   RLE    Family history of adverse reaction to anesthesia    a family member woke up during surgery; think it was my mom   Fibromyalgia    Generalized osteoarthritis of multiple sites 11/04/2013   GERD (gastroesophageal reflux disease)    Headache    Heart palpitations 12/14/2013   resolved   History of blood clots    superficial   Hypoglycemia    Hypothyroid    takes Synthroid  daily   Incomplete emptying of bladder    Insomnia    takes Trazodone  nightly   Iron  deficiency anemia    takes Ferrous Sulfate  daily   Joint pain    Joint swelling    knees and ankles   Memory disorder 08/14/2016   Morbid obesity (HCC)    Nausea    takes Zofran  as needed.Seeing GI doc   Neck pain 05/25/2016   OSA on CPAP    tested more than 5 yrs ago.     Osteoarthritis    Osteopenia    in feet   PFO (patent foramen ovale)    no murmur   PFO (patent foramen ovale)    Pre-diabetes    Primary osteoarthritis of both feet 05/29/2016   Right bunionectomy August 2017 by Dr. Julio Ohm   PVC's (premature  ventricular contractions) 10/11/2021   Monitor 9/23: 1.8% PVCs; rare PACs; o/w NSR   Scoliosis    Skin abnormality 02/04/2020   raised area on lip   Stroke [facility]) severallast 2014   right foot weakness; memory issues, black spot right visual field since (03/23/2015)   Thrombophlebitis    Trochanteric bursitis of both hips 05/25/2016   Unilateral primary osteoarthritis, right knee 05/29/2016   Urinary urgency 04/19/2016   Vein disorder 11/29/2010   varicose veins both legs   Wears glasses     Family History  Problem Relation Age of Onset   Cancer Mother        skin and breast   Myasthenia gravis Mother    Breast cancer Mother    Heart disease Father    Cancer Father        brain   Parkinson's disease Father    Heart disease Sister    Heart attack Sister    Heart disease Brother    Cancer Brother    Diabetes Brother    Stroke Brother    Cancer Maternal Grandfather    Hypothyroidism Daughter     Hypertension Other    Colon cancer Neg Hx    Past Surgical History:  Procedure Laterality Date   BIOPSY  11/14/2018   Procedure: BIOPSY;  Surgeon: Ruby Corporal, MD;  [facility];  Service: Endoscopy;;  esophagusgastric   BIOPSY  06/07/2022   Procedure: BIOPSY;  Surgeon: Urban Garden, MD;  [facility];  Service: Gastroenterology;;   BONE EXCISION Right 08/29/2017   Procedure: right trapezium excision;  Surgeon: Lyanne Sample, MD;  Location: [facility];  Service: Orthopedics;  Laterality: Right;   BUNIONECTOMY Right 08/2015   CARDIAC CATHETERIZATION     2008.  it was fine (not sure why she had it done, and doesn't know where)   CARPOMETACARPEL SUSPENSION PLASTY Right 08/29/2017   Procedure: SUSPENSION PLASTY RIGHT THUMB;  Surgeon: Lyanne Sample, MD;  Location: [facility];  Service: Orthopedics;  Laterality: Right;   COLONOSCOPY N/A 03/25/2013   Procedure: COLONOSCOPY;  Surgeon: Ruby Corporal, MD;  [facility];  Service: Endoscopy;  Laterality: N/A;  930   ERCP  06/02/2020   ESOPHAGOGASTRODUODENOSCOPY     ESOPHAGOGASTRODUODENOSCOPY (EGD) WITH PROPOFOL  N/A 11/14/2018   Procedure: ESOPHAGOGASTRODUODENOSCOPY (EGD) WITH PROPOFOL ;  Surgeon: Ruby Corporal, MD;  [facility];  Service: Endoscopy;  Laterality: N/A;  1:55pm-office moved to 11:00am/pt notified to arrive at 9:30am per KF   ESOPHAGOGASTRODUODENOSCOPY (EGD) WITH PROPOFOL  N/A 06/07/2022   Procedure: ESOPHAGOGASTRODUODENOSCOPY (EGD) WITH PROPOFOL ;  Surgeon: Urban Garden, MD;  [facility];  Service: Gastroenterology;  Laterality: N/A;  2:00pm;asa 3   EXPLORATORY LAPAROTOMY     took fallopian tubes out   HALLUX FUSION Right 02/12/2020   Procedure: HALLUX INTERPHANGEAL JOINT  FUSION;  Surgeon: Velma Ghazi, DPM;  Location: [facility];  Service: Podiatry;  Laterality: Right;   HAMMER TOE SURGERY Right  02/12/2020   Procedure: SECOND AND THIRD HAMMER TOE CORRECTION; CAPSULOTOMY SECOND INTERPHALANGEAL JOINT;  Surgeon: Velma Ghazi, DPM;  Location: [facility];  Service: Podiatry;  Laterality: Right;   JOINT REPLACEMENT     bil knee    KNEE ARTHROSCOPY Left    KNEE ARTHROSCOPY W/ ACL RECONSTRUCTION Right yrs ago   added pins   LAPAROSCOPIC CHOLECYSTECTOMY  ~ 2001   ROUX-EN-Y GASTRIC BYPASS  11/20/2010  [place]   SPINAL CORD STIMULATOR INSERTION N/A 04/18/2017   Procedure: LUMBAR SPINAL CORD STIMULATOR INSERTION;  Surgeon: Gerri Kras, MD;  Location: [facility];  Service: Neurosurgery;  Laterality: N/A;  LUMBAR SPINAL CORD STIMULATOR INSERTION   stomach stent  04/28/2020   TENDON TRANSFER Right 08/29/2017   Procedure: right abductor pollicis longus transfer;  Surgeon: Lyanne Sample, MD;  Location: [facility];  Service: Orthopedics;  Laterality: Right;   TOTAL KNEE ARTHROPLASTY Left 03/23/2015   Procedure: TOTAL KNEE ARTHROPLASTY;  Surgeon: Timothy Ford, MD;  Location: [facility];  Service: Orthopedics;  Laterality: Left;   TOTAL KNEE ARTHROPLASTY Right 08/15/2016   Procedure: RIGHT TOTAL KNEE ARTHROPLASTY, REMOVAL ACL SCREWS;  Surgeon: Timothy Ford, MD;  Location: [facility];  Service: Orthopedics;  Laterality: Right;   TOTAL KNEE ARTHROPLASTY WITH HARDWARE REMOVAL Right    UPPER GI ENDOSCOPY  12/06/2022   VAGINAL HYSTERECTOMY     tah/bso   VARICOSE VEIN SURGERY Right X 2   WEIL OSTEOTOMY Right 02/12/2020   Procedure: DOUBLE L OSTEOTOMY;  Surgeon: Velma Ghazi, DPM;  Location: [facility];  Service: Podiatry;  Laterality: Right;   Social History   Social History Narrative   Lives with husband   Caffeine use: No soda   Mainly water , drinks decaf tea   Right handed   Immunization History  Administered Date(s) Administered   Influenza Split 11/01/2011, 08/23/2014   Influenza-Unspecified 08/23/2014, 11/22/2017   Moderna SARS-COV2 Booster  Vaccination 12/16/2019, 12/31/2020   Moderna Sars-Covid-2 Vaccination 03/16/2019, 04/04/2019, 04/13/2019, 05/15/2019   Pneumococcal Polysaccharide-23 04/26/2017   Zoster Recombinant(Shingrix) 07/02/2017, 01/08/2018     Objective: Vital Signs: BP (!) 76/55 (BP Location: Left Arm, Patient Position: Sitting)   Pulse 92   Resp 16   Ht 5' 5 (1.651 m)   Wt 248 lb 9.6 oz (112.8 kg)   BMI 41.37 kg/m    Physical Exam Vitals and nursing note reviewed.  Constitutional:      Appearance: She is well-developed.  HENT:     Head: Normocephalic and atraumatic.  Eyes:     Conjunctiva/sclera: Conjunctivae normal.  Cardiovascular:     Rate and Rhythm: Normal rate and regular rhythm.     Heart sounds: Normal heart sounds.  Pulmonary:     Effort: Pulmonary effort is normal.     Breath sounds: Normal breath sounds.  Abdominal:     General: Bowel sounds are normal.     Palpations: Abdomen is soft.  Musculoskeletal:     Cervical back: Normal range of motion.  Lymphadenopathy:     Cervical: No cervical adenopathy.  Skin:    General: Skin is warm and dry.     Capillary Refill: Capillary refill takes less than 2 seconds.  Neurological:     Mental Status: She is alert and oriented to person, place, and time.  Psychiatric:        Behavior: Behavior normal.      Musculoskeletal Exam: She had good range of motion of the cervical spine.  She had bilateral trapezius spasm.  She had no tenderness over thoracic or lumbar spine.  She had painful range of motion of her lumbar spine.  Shoulders, elbows, wrist, MCPs PIPs and DIPs were in good range of motion.  No synovitis was noted.  Hip joints with good range of motion.  Knee joints were replaced and were in good range of motion without any warmth swelling or effusion.  There was no tenderness over  ankles or MTPs.  CDAI Exam: CDAI Score: -- Patient Global: --; Provider Global: -- Swollen: --; Tender: -- Joint Exam 07/03/2023   No joint exam has been  documented for this visit   There is currently no information documented on the homunculus. Go to the Rheumatology activity and complete the homunculus joint exam.  Investigation: No additional findings.  Imaging: XR Thoracic Spine 2 View Result Date: 06/26/2023 XRs of the thoracic spine from 06/24/2023 were independently reviewed and interpreted, showing posterior instrumentation to T10. Several of the screws appear to breach the superior endplate of their respective vertebra. The T10 screws have lucency around them. There is kyphosis above her fusion that measures 14 degrees. Her films immediately after surgery measure about 12 degrees of kyphosis (Glattes). No fracture or dislocation seen.    Recent Labs: Lab Results  Component Value Date   WBC 6.6 03/04/2023   HGB 13.9 03/04/2023   PLT 276 03/04/2023   NA 138 06/06/2022   K 4.1 06/06/2022   CL 108 06/06/2022   CO2 22 06/06/2022   GLUCOSE 73 06/06/2022   BUN 11 06/06/2022   CREATININE 0.98 06/06/2022   BILITOT 0.9 09/06/2021   ALKPHOS 150 (H) 09/06/2021   AST 17 09/06/2021   ALT 13 09/06/2021   PROT 7.1 09/06/2021   ALBUMIN  3.7 09/06/2021   CALCIUM 8.8 (L) 06/06/2022   GFRAA 81 10/14/2018    Speciality Comments: No specialty comments available.  Procedures:  Trigger Point Inj  Date/Time: 07/03/2023 11:40 AM  Performed by: Nicholas Bari, MD Authorized by: Nicholas Bari, MD   Consent Given by:  Patient Site marked: the procedure site was marked   Timeout: prior to procedure the correct patient, procedure, and site was verified   Indications:  Muscle spasm and pain Total # of Trigger Points:  2 Location: neck   Needle Size:  27 G Approach:  Dorsal Medications #1:  0.5 mL lidocaine  1 %; 20 mg triamcinolone  acetonide 40 MG/ML Medications #2:  0.5 mL lidocaine  1 %; 20 mg triamcinolone  acetonide 40 MG/ML Patient tolerance:  Patient tolerated the procedure well with no immediate complications Comments: Risk of  infection, tendon injury, nerve injury, dermal atrophy and hypopigmentation was discussed.  Allergies: Lyrica [pregabalin], Belsomra [suvorexant], Morphine  and codeine, Sulfamethoxazole-trimethoprim, and Tape   Assessment / Plan:     Visit Diagnoses: Fibromyalgia -she continues to have generalized pain and discomfort in all of her joints and muscles.  She is under the care of pain management. She is taking baclofen  10 mg p.o. 3 times daily as needed muscle spasms, cymbalta  60 mg, 2 capsules daily, gabapentin  800 mg p.o. 3 times daily, nucynta  every 12 hours as needed pain, oxycodone  10 mg every 6 hours as needed and tizanidine  4 mg every 6 hours as needed muscle spasms.  In spite of all the medication she continues to have ongoing pain and discomfort.  Trapezius muscle spasm-she has been having creased pain and discomfort in the bilateral trapezius region.  She requested trigger point injections.  After side effects were discussed bilateral trapezius region was injected as described above.  Patient tolerated the procedure well.  Postprocedure instructions were given.  A handout on cervical exercises was given.  Primary osteoarthritis of both hands-she had bilateral PIP and DIP thickening with no synovitis.  Joint protection was.  Was discussed.  Trochanteric bursitis of both hips-she continues to have intermittent pain especially at night when she sleeps on her side.  IT band stretches were emphasized.  History of total bilateral knee replacement-she continues to have discomfort in her knee joints despite having knee joint replacement.  No warmth swelling or effusion was noted.  She has been taking Celebrex on a as needed basis.  Primary osteoarthritis of both feet-proper fitting shoes were advised.  Degeneration of intervertebral disc of lumbar region without discogenic back pain or lower extremity pain - Surgery by Dr. Richardo Chandler in the past.  She has been seeing Dr. Sulema Endo.  Patient states she was  recently referred to physical therapy.  Chronic pain.  Under the care of pain management.  DDD (degenerative disc disease), thoracic-chronic discomfort.  Other medical problems listed as follows:  History of thrombophlebitis  PFO (patent foramen ovale)  History of diabetes mellitus  History of sleep apnea  History of gastric bypass  History of cerebral infarction  Orders: Orders Placed This Encounter  Procedures   Trigger Point Inj   No orders of the defined types were placed in this encounter.    Follow-Up Instructions: Return in about 6 months (around 01/02/2024) for Osteoarthritis.   Nicholas Bari, MD  Note - This record has been created using Animal nutritionist.  Chart creation errors have been sought, but may not always  have been located. Such creation errors do not reflect on  the standard of medical care.

## 2023-06-24 ENCOUNTER — Encounter: Payer: Self-pay | Admitting: Orthopedic Surgery

## 2023-06-24 ENCOUNTER — Ambulatory Visit: Admitting: Orthopedic Surgery

## 2023-06-24 ENCOUNTER — Other Ambulatory Visit (INDEPENDENT_AMBULATORY_CARE_PROVIDER_SITE_OTHER): Payer: Self-pay

## 2023-06-24 VITALS — BP 136/78 | HR 108 | Ht 65.0 in | Wt 265.9 lb

## 2023-06-24 DIAGNOSIS — M549 Dorsalgia, unspecified: Secondary | ICD-10-CM

## 2023-06-26 DIAGNOSIS — M79671 Pain in right foot: Secondary | ICD-10-CM | POA: Diagnosis not present

## 2023-06-26 DIAGNOSIS — M47816 Spondylosis without myelopathy or radiculopathy, lumbar region: Secondary | ICD-10-CM | POA: Diagnosis not present

## 2023-06-26 DIAGNOSIS — G894 Chronic pain syndrome: Secondary | ICD-10-CM | POA: Diagnosis not present

## 2023-06-26 DIAGNOSIS — M5416 Radiculopathy, lumbar region: Secondary | ICD-10-CM | POA: Diagnosis not present

## 2023-06-26 NOTE — Progress Notes (Signed)
 Orthopedic Spine Surgery Office Note   Assessment: Patient is a 63 y.o. female who underwent T10-S1 PSIF and multi-level TLIF with my partner on 07/2021 who comes in today with right SI joint pain and mid thoracic back pain     Plan: -Patient has tried activity modification, pain management, right SI joint injections -She has gotten good relief with SI joint injections in the past so could consider that again as a treatment option -Her more painful area is her midthoracic spine. She does have kyphosis above her fusion but it is not significantly change since her immediate post-operative films. She also has lucency around the T10 screws so pseudarthrosis is a potential cause of her pain. Talked about CT scan to work that up but patient wanted to start with some exercises and PT -Referral provided to PT and home exercise program provided to her -Weight goal of 235 pounds before any elective surgery -Patient should return to office in 6 weeks, x-rays at next visit: none     Patient expressed understanding of the plan and all questions were answered to the patient's satisfaction.    ___________________________________________________________________________   History: Patient is a 63 y.o. female who has been previously seen in the office for routine post-operative follow up after my partner did a T10-S1 PSIF with multi-level TLIFs. She comes in today with worsening thoracic back pain. She feels the pain right around the top of her incision. She also has pain in her right SI joint region. There was no recent injury or trauma that preceded the onset of the pain. She has had the right SI joint pain before and responded to an injection in the past. This is not as bothersome as her midthoracic back pain. She feels this pain with activity and at rest. No pain radiating into either lower extremity. Has had chronic imbalance issues. Patient not sure if it is any worse. Has chronic incontinence with no changes  recently. No saddle anesthesia.    Previous treatments: pain management, activity modification, right SI injections     Physical Exam:   General: no acute distress, appears stated age, walking with cane Neurologic: alert, answering questions appropriately, following commands Respiratory: unlabored breathing on room air, symmetric chest rise Psychiatric: appropriate affect, normal cadence to speech     MSK (spine):   -Strength exam                                                   Left                  Right EHL                              5/5                  -/5 TA                                 5/5                  5/5 GSC                             5/5  5/5 Knee extension            5/5                  5/5 Hip flexion                    5/5                  5/5   Had MTP fusion on the right    -Sensory exam                           Sensation intact to light touch in L3-S1 nerve distributions of bilateral lower extremities   -Straight leg raise: negative -Clonus: no beats bilaterally   Imaging: XRs of the thoracic spine from 06/24/2023 were independently reviewed and interpreted, showing posterior instrumentation to T10. Several of the screws appear to breach the superior endplate of their respective vertebra. The T10 screws have lucency around them. There is kyphosis above her fusion that measures 14 degrees. Her films immediately after surgery measure about 12 degrees of kyphosis (Glattes). No fracture or dislocation seen.      Patient name: Debbie Pineda Patient MRN: [id number] Date of visit: 06/26/23

## 2023-07-02 ENCOUNTER — Ambulatory Visit (INDEPENDENT_AMBULATORY_CARE_PROVIDER_SITE_OTHER): Payer: HMO | Admitting: Gastroenterology

## 2023-07-02 ENCOUNTER — Encounter (INDEPENDENT_AMBULATORY_CARE_PROVIDER_SITE_OTHER): Payer: Self-pay | Admitting: Gastroenterology

## 2023-07-02 VITALS — BP 89/60 | HR 102 | Temp 98.1°F | Ht 65.0 in | Wt 249.3 lb

## 2023-07-02 DIAGNOSIS — K5903 Drug induced constipation: Secondary | ICD-10-CM

## 2023-07-02 DIAGNOSIS — K5900 Constipation, unspecified: Secondary | ICD-10-CM | POA: Diagnosis not present

## 2023-07-02 DIAGNOSIS — R110 Nausea: Secondary | ICD-10-CM

## 2023-07-02 DIAGNOSIS — Z1211 Encounter for screening for malignant neoplasm of colon: Secondary | ICD-10-CM

## 2023-07-02 DIAGNOSIS — K219 Gastro-esophageal reflux disease without esophagitis: Secondary | ICD-10-CM | POA: Diagnosis not present

## 2023-07-02 NOTE — Progress Notes (Signed)
 Referring Provider: Roxene Cora, PA* Primary Care Physician:  Roxene Cora, PA-C Primary GI Physician: Dr. Sammi Crick   Chief Complaint  Patient presents with   Follow-up    Pt arrives for follow up. Pt states no issues at this time. Has upcoming surgery scheduled (Roux-en-y repeat) in July   HPI:   Debbie Pineda is a 63 y.o. 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 She has UGI series scheduled. still taking movantik , clearlax and occasionally takes colace 240mg . Occasionally will take dulcolax. some fatigue but denies sob or dizziness. She has some mid abdominal discomfort at times, where she cannot stand her pants squeezing her abdomen.  noted some belching after eating, doing omeprazole  40mg  BID.  taking PO iron  BID.    Recommended iron  studies and CBC, reach out to your doctor at [facility] regarding darker stools, Continue with iron  pills for now, continue omeprazole  and famotidine  twice daily, good reflux precautions, Continue movantik  and clearlax/colace as you are doing for constipation Complete the course of carafate you were prescribed by [facility], may consider SIBO testing if belching/bloating continues, due for colonoscopy in march 2025  Labs on 5/13 with hgb 13.7, iron  103, TIBC 315, iron  sat 33, B12 >2000 Ferritin 108 in February   Present:  Doing well today. She states that she found out that Iron  was making her stools dark. She stopped taking her iron  (after EGD in March) and notes dark stools resolved. She is having repeat Roux en Y on 7/17, this was recommended after her most recent EGD in march due to  fistula.  GERD is well managed on omeprazole  40mg  BID and famotidine  40mg  daily.   She is taking reglan  5mg  BID which provides good results of her nausea. No vomiting. Denies any side effects from the medication.   for constipation she is doing movantik , clearlax, and 2 of the 240mg  docusate. Having a BM on average once daily unless she misses part of her bowel regimen. Some occasional upper abdominal pain. No rectal bleeding.   Would like to hold off on screening colonoscopy until a few months after her upcoming surgery, maybe around October   EGD: 03/2023 Normal esophagus.                         - Gastric bypass with a normal-sized pouch.                         Gastrojejunal anastomosis characterized by healthy                         appearing mucosa.                         - Persistent gastrogastrostomy fistula. Not amenable                         to endoscopic closure.                         -  Normal duodenal bulb, second portion of the duodenum                         and major papilla. No residual adenomatous changes                         seen at the prior ampullectomy site.                         - No specimens collected. (Referred to surgery for surgical repair of fistula)           Last Colonoscopy: march 2015, normal, small eternal hemorrhoids repeat in 10 years CT A/P with contrast 09/06/21 Prominent stool throughout the colon favors constipation. 2. Other imaging findings of potential clinical significance: Small right renal angiomyolipoma. Pneumobilia. Prior cholecystectomy. Prior gastric bypass. Extensive postoperative findings in the thoracolumbar spine.   ERCP: 06/02/20: No specimens collected.  - Gastric bypass. Gastrojejunal anastomosis characterized by healthy appearing mucosa. - Pre-existing gastrogastrostomy AXIOS stent. Removed and replaced with two 10Fr x 4cm double pigtail stents to maintain patency (given the anticipated need for  ampullary surveillance in  the future). - A single ampullary polyp (adenomatous) Snare papillectomy of the  major papilla was performed. Resected and retrieved  with hemostasis achieved through the use of snare tip soft coagulation. - The entire main bile duct was mildly dilated. acquired. - A biliary sphincterotomy was performed.  - The biliary tree was swept and sludge was found. (Recommended repeat EGD in 6-12 months, pending path)            Past Medical History:  Diagnosis Date   ADD (attention deficit disorder)    Arthritis    "all over"   Cerebral infarction (HCC) 10/30/2011   Cerebrovascular disease 08/14/2016   Chronic back pain    "all over"   Chronic low back pain 01/11/2016   Complication of anesthesia    tends to have hypotension when NPO and post-anesthesia   Constipation    takes stool softener daily   Degenerative disk disease    Degenerative joint disease    DVT (deep venous thrombosis) (HCC) 2014   RLE   Family history of adverse reaction to anesthesia    a family member woke up during surgery; "think it was my mom"   Fibromyalgia    Generalized osteoarthritis of multiple sites 11/04/2013   GERD (gastroesophageal reflux disease)    Headache    Heart palpitations 12/14/2013   resolved   History of blood clots    superficial   Hypoglycemia    Hypothyroid    takes Synthroid  daily   Incomplete emptying of bladder    Insomnia    takes Trazodone  nightly   Iron  deficiency anemia    takes Ferrous Sulfate  daily   Joint pain    Joint swelling    knees and ankles   Memory disorder 08/14/2016   Morbid obesity (HCC)    Nausea    takes Zofran  as needed.Seeing GI doc   Neck pain 05/25/2016   OSA on CPAP    tested more than 5 yrs ago.     Osteoarthritis    Osteopenia    in feet   PFO (patent foramen ovale)    no murmur   PFO (patent foramen ovale)    Pre-diabetes    Primary osteoarthritis of both feet 05/29/2016   Right bunionectomy August 2017  by Dr. Julio Ohm   PVC's (premature  ventricular contractions) 10/11/2021   Monitor 9/23: 1.8% PVCs; rare PACs; o/w NSR   Scoliosis    Skin abnormality 02/04/2020   raised area on lip   Stroke [facility]) "several"last 2014   right foot weakness; memory issues, black spot right visual field since" (03/23/2015)   Thrombophlebitis    Trochanteric bursitis of both hips 05/25/2016   Unilateral primary osteoarthritis, right knee 05/29/2016   Urinary urgency 04/19/2016   Vein disorder 11/29/2010   varicose veins both legs   Wears glasses     Past Surgical History:  Procedure Laterality Date   BIOPSY  11/14/2018   Procedure: BIOPSY;  Surgeon: Ruby Corporal, MD;  [facility];  Service: Endoscopy;;  esophagusgastric   BIOPSY  06/07/2022   Procedure: BIOPSY;  Surgeon: Urban Garden, MD;  [facility];  Service: Gastroenterology;;   BONE EXCISION Right 08/29/2017   Procedure: right trapezium excision;  Surgeon: Lyanne Sample, MD;  Location: [facility];  Service: Orthopedics;  Laterality: Right;   BUNIONECTOMY Right 08/2015   CARDIAC CATHETERIZATION     2008.  "it was fine" (not sure why she had it done, and doesn't know where)   CARPOMETACARPEL SUSPENSION PLASTY Right 08/29/2017   Procedure: SUSPENSION PLASTY RIGHT THUMB;  Surgeon: Lyanne Sample, MD;  Location: [facility];  Service: Orthopedics;  Laterality: Right;   COLONOSCOPY N/A 03/25/2013   Procedure: COLONOSCOPY;  Surgeon: Ruby Corporal, MD;  [facility];  Service: Endoscopy;  Laterality: N/A;  930   ERCP  06/02/2020   ESOPHAGOGASTRODUODENOSCOPY     ESOPHAGOGASTRODUODENOSCOPY (EGD) WITH PROPOFOL  N/A 11/14/2018   Procedure: ESOPHAGOGASTRODUODENOSCOPY (EGD) WITH PROPOFOL ;  Surgeon: Ruby Corporal, MD;  [facility];  Service: Endoscopy;  Laterality: N/A;  1:55pm-office moved to 11:00am/pt notified to arrive at 9:30am per KF   ESOPHAGOGASTRODUODENOSCOPY (EGD) WITH PROPOFOL  N/A 06/07/2022    Procedure: ESOPHAGOGASTRODUODENOSCOPY (EGD) WITH PROPOFOL ;  Surgeon: Urban Garden, MD;  [facility];  Service: Gastroenterology;  Laterality: N/A;  2:00pm;asa 3   EXPLORATORY LAPAROTOMY     "took fallopian tubes out"   HALLUX FUSION Right 02/12/2020   Procedure: HALLUX INTERPHANGEAL JOINT  FUSION;  Surgeon: Velma Ghazi, DPM;  Location: [facility];  Service: Podiatry;  Laterality: Right;   HAMMER TOE SURGERY Right 02/12/2020   Procedure: SECOND AND THIRD HAMMER TOE CORRECTION; CAPSULOTOMY SECOND INTERPHALANGEAL JOINT;  Surgeon: Velma Ghazi, DPM;  Location: [facility];  Service: Podiatry;  Laterality: Right;   JOINT REPLACEMENT     bil knee    KNEE ARTHROSCOPY Left    KNEE ARTHROSCOPY W/ ACL RECONSTRUCTION Right yrs ago   "added pins"   LAPAROSCOPIC CHOLECYSTECTOMY  ~ 2001   ROUX-EN-Y GASTRIC BYPASS  11/20/2010   [place]   SPINAL CORD STIMULATOR INSERTION N/A 04/18/2017   Procedure: LUMBAR SPINAL CORD STIMULATOR INSERTION;  Surgeon: Gerri Kras, MD;  Location: [facility];  Service: Neurosurgery;  Laterality: N/A;  LUMBAR SPINAL CORD STIMULATOR INSERTION   stomach stent  04/28/2020   TENDON TRANSFER Right 08/29/2017   Procedure: right abductor pollicis longus transfer;  Surgeon: Lyanne Sample, MD;  Location: [facility];  Service: Orthopedics;  Laterality: Right;   TOTAL KNEE ARTHROPLASTY Left 03/23/2015   Procedure: TOTAL KNEE ARTHROPLASTY;  Surgeon: Timothy Ford, MD;  Location: [facility];  Service: Orthopedics;  Laterality: Left;   TOTAL KNEE  ARTHROPLASTY Right 08/15/2016   Procedure: RIGHT TOTAL KNEE ARTHROPLASTY, REMOVAL ACL SCREWS;  Surgeon: Timothy Ford, MD;  Location: [facility];  Service: Orthopedics;  Laterality: Right;   TOTAL KNEE ARTHROPLASTY WITH HARDWARE REMOVAL Right    UPPER GI ENDOSCOPY  12/06/2022   VAGINAL HYSTERECTOMY     tah/bso   VARICOSE VEIN SURGERY Right X 2   WEIL OSTEOTOMY Right 02/12/2020    Procedure: DOUBLE L OSTEOTOMY;  Surgeon: Velma Ghazi, DPM;  Location: [facility];  Service: Podiatry;  Laterality: Right;    Current Outpatient Medications  Medication Sig Dispense Refill   baclofen  (LIORESAL ) 10 MG tablet Take 1 tablet (10 mg total) by mouth 3 (three) times daily. 90 each 0   Biotin 5000 MCG CAPS daily at 6 (six) AM.     Blood Glucose Monitoring Suppl (FREESTYLE PRECISION NEO SYSTEM) w/Device KIT      Cholecalciferol  (VITAMIN D3) 125 MCG (5000 UT) CAPS daily at 6 (six) AM.     Cyanocobalamin  (VITAMIN B-12) 5000 MCG SUBL daily at 6 (six) AM.     diclofenac  sodium (VOLTAREN ) 1 % GEL Apply 4 g topically 4 (four) times daily as needed (PAIN). 5 Tube 1   DULoxetine  (CYMBALTA ) 60 MG capsule Take 60 mg by mouth 2 (two) times daily.     famotidine  (PEPCID ) 40 MG tablet Take 1 tablet (40 mg total) by mouth at bedtime. 90 tablet 1   ferrous gluconate  (FERGON) 324 MG tablet Take 1 tablet (324 mg total) by mouth 2 (two) times daily with a meal. 60 tablet 0   folic acid  (FOLVITE ) 400 MCG tablet Take 400 mcg by mouth daily.     gabapentin  (NEURONTIN ) 800 MG tablet 800 mg. One tid     Galcanezumab -gnlm (EMGALITY ) 120 MG/ML SOSY INJECT 1 ML SUBCUTANEOUSLY  ONCE EVERY MONTH 1 mL 11   Krill Oil 350 MG CAPS Take 350 mg by mouth at bedtime.      levothyroxine  (SYNTHROID ) 125 MCG tablet Take 1 tablet (125 mcg total) by mouth daily before breakfast. 30 tablet 0   Methylfol-Methylcob-Acetylcyst (METAFOLBIC PLUS) 6-2-600 MG TABS Take 1 tablet by mouth daily. 30 tablet 11   methylphenidate  (CONCERTA ) 27 MG PO CR tablet Take 1 tablet (27 mg total) by mouth 2 (two) times daily. 10 am & 1400 60 tablet 0   metoCLOPramide  (REGLAN ) 5 MG tablet Take 1 tablet (5 mg total) by mouth 2 (two) times daily. 60 tablet 2   miconazole (ZEASORB-AF) 2 % powder Every shift as needed. Apply to red areas in folds of skin.     mirabegron  ER (MYRBETRIQ ) 50 MG TB24 tablet Take 1 tablet (50 mg total) by  mouth daily. 30 tablet 0   Multiple Minerals-Vitamins (CAL-MAG-ZINC -D PO) Take 3 tablets by mouth daily. 333 mg-133 unit -133 mg-5 mg     Multiple Vitamins-Minerals (ONE-A-DAY WOMENS PETITES) TABS Take 1 tablet by mouth 2 (two) times daily.     naloxegol  oxalate (MOVANTIK ) 25 MG TABS tablet TAKE (1) TABLET BY MOUTH ONCE DAILY. 30 tablet 3   NARCAN  4 MG/0.1ML LIQD nasal spray kit Place 0.1 sprays (0.4 mg total) into the nose 2 (two) times daily as needed. 1 each 0   NUCYNTA  ER 100 MG 12 hr tablet SMARTSIG:1 Tablet(s) By Mouth Every 12 Hours     omeprazole  (PRILOSEC) 40 MG capsule Take 1 capsule (40 mg total) by mouth 2 (two) times daily. 180 capsule 3   ondansetron  (ZOFRAN -ODT) 4 MG disintegrating  tablet Take 1 tablet (4 mg total) by mouth every 8 (eight) hours as needed for nausea or vomiting. 20 tablet 0   ONETOUCH ULTRA test strip      OneTouch UltraSoft 2 Lancets MISC      Oxycodone  HCl 10 MG TABS Take 10 mg by mouth every 6 (six) hours as needed.     OZEMPIC, 1 MG/DOSE, 4 MG/3ML SOPN Inject 1 mg into the skin once a week.     Rimegepant Sulfate  (NURTEC) 75 MG TBDP DISSOLVE 1 TABLET BY MOUTH ONCE DAILY AS NEEDED, max is 1 tablet daily 8 tablet 11   rivaroxaban  (XARELTO ) 20 MG TABS tablet Take 1 tablet (20 mg total) by mouth daily at 6 PM. 30 tablet 0   tiZANidine  (ZANAFLEX ) 4 MG tablet Take 1 tablet (4 mg total) by mouth every 6 (six) hours as needed for muscle spasms. 30 tablet 0   topiramate  (TOPAMAX ) 100 MG tablet Take 1 tablet (100 mg total) by mouth 2 (two) times daily. 180 tablet 3   traZODone  (DESYREL ) 100 MG tablet Take 0.5 tablets (50 mg total) by mouth at bedtime. 15 tablet 0   triamcinolone  cream (KENALOG ) 0.1 % SMARTSIG:1 Application Topical 2-3 Times Daily     trospium  (SANCTURA ) 20 MG tablet Take 1 tablet (20 mg total) by mouth 2 (two) times daily. 60 tablet 0   vitamin C  (ASCORBIC ACID ) 500 MG tablet Take 500 mg by mouth daily.     No current facility-administered medications  for this visit.    Allergies as of 07/02/2023 - Review Complete 07/02/2023  Allergen Reaction Noted   Lyrica [pregabalin] Shortness Of Breath and Swelling 12/01/2015   Belsomra [suvorexant] Other (See Comments) 12/01/2015   Morphine  and codeine Itching 10/26/2010   Sulfamethoxazole-trimethoprim Itching and Rash 03/10/2015   Tape Itching and Rash 03/10/2015    Social History   Socioeconomic History   Marital status: Married    Spouse name: Royston Cornea   Number of children: 1   Years of education: 14   Highest education level: Not on file  Occupational History   Occupation: Disability   Occupation: formerly Charity fundraiser, [employer]  Tobacco Use   Smoking status: Former    Current packs/day: 0.00    Average packs/day: 0.8 packs/day for 8.0 years (6.0 ttl pk-yrs)    Types: Cigarettes    Start date: 12/01/1982    Quit date: 12/01/1990    Years since quitting: 32.6    Passive exposure: Past   Smokeless tobacco: Never  Vaping Use   Vaping status: Never Used  Substance and Sexual Activity   Alcohol  use: No    Comment: 03/23/2015 "stopped drinking in 2012 w/gastric bypass; drank socially before bypass"   Drug use: No   Sexual activity: Not Currently    Birth control/protection: Surgical  Other Topics Concern   Not on file  Social History Narrative   Lives with husband   Caffeine use: No soda   Mainly water , drinks decaf tea   Right handed   Social Drivers of Corporate investment banker Strain: Not on file  Food Insecurity: Not on file  Transportation Needs: Not on file  Physical Activity: Not on file  Stress: Not on file  Social Connections: Not on file    Review of systems General: negative for malaise, night sweats, fever, chills, weight loss Neck: Negative for lumps, goiter, pain and significant neck swelling Resp: Negative for cough, wheezing, dyspnea at rest CV: Negative for chest pain, leg swelling,  palpitations, orthopnea GI: denies melena, hematochezia, nausea, vomiting, diarrhea,  constipation, dysphagia, odyonophagia, early satiety or unintentional weight loss.  The remainder of the review of systems is noncontributory.  Physical Exam: BP (!) 89/60   Pulse (!) 102   Temp 98.1 F (36.7 C)   Ht 5\' 5"  (1.651 m)   Wt 249 lb 4.8 oz (113.1 kg)   BMI 41.49 kg/m  General:   Alert and oriented. No distress noted. Pleasant and cooperative.  Head:  Normocephalic and atraumatic. Eyes:  Conjuctiva clear without scleral icterus. Mouth:  Oral mucosa pink and moist. Good dentition. No lesions. Heart: Normal rate and rhythm, s1 and s2 heart sounds present.  Lungs: Clear lung sounds in all lobes. Respirations equal and unlabored. Abdomen:  +BS, soft, non-tender and non-distended. No rebound or guarding. No HSM or masses noted. Derm: No palmar erythema or jaundice Msk:  Symmetrical without gross deformities. Normal posture. Extremities:  Without edema. Neurologic:  Alert and  oriented x4 Psych:  Alert and cooperative. Normal mood and affect.  Invalid input(s): "6 MONTHS"   ASSESSMENT: Debbie Pineda is a 63 y.o. female presenting today for follow up of dark stools, GERD, Nausea and Constipation  Dark stools:. No evidence of UGI bleeding source on most recent EGD.  resolved after she stopped iron  pill  GERD: well managed on omeprazole  40mg  BID, famotidine  40mg  at bedtime. Denies breakthrough, dysphagia, odynophagia.   Nausea: well managed with reglan  5mg  BID. Encouraged to limit reglan  use to PRN to avoid potential side effects though at this time seems to be tolerating medication well.   Constipation: well managed on movantik , clearlax, docusate daily. Having a BM daily as long as she is consistent with bowel regimen.  She is overdue for screening colonoscopy, last was in march 2015. She has upcoming repeat Roux en Y in July and would like to plan for colonoscopy around October. Will place her on the recall list to try and schedule her closer to this time.    PLAN:   -place on recall to schedule colonoscopy around October   -continue famotidine  40mg  at bedtime -Continue reglan  5mg , try to limit this to PRN basis -continue Omeprazole  40mg  BID  -continue movantik  25mg , clearlax and docusate BID bowel regimen daily  -Increase water  intake, aim for atleast 64 oz per day -Increase fruits, veggies and whole grains, kiwi and prunes are especially good for constipation  All questions were answered, patient verbalized understanding and is in agreement with plan as outlined above.   Follow Up: 6 months   Makara Lanzo L. Jordan Pardini, MSN, APRN, AGNP-C Adult-Gerontology Nurse Practitioner [facility]

## 2023-07-02 NOTE — Patient Instructions (Signed)
-  I will place you on the recall list to schedule colonoscopy around October  -continue famotidine  40mg  at bedtime -Continue reglan  5mg , you may want to try decreasing dose of this and using as needed as we try to avoid long term use of this medication -continue Omeprazole  40mg  twice daily -continue movantik , clearlax, docusate -Increase water  intake, aim for atleast 64 oz per day Increase fruits, veggies and whole grains, kiwi and prunes are especially good for constipation -let me know if you have any new or worsening GI issues  Good luck with your upcoming surgery!!  Follow up 6 months  It was a pleasure to see you today. I want to create trusting relationships with patients and provide genuine, compassionate, and quality care. I truly value your feedback! please be on the lookout for a survey regarding your visit with me today. I appreciate your input about our visit and your time in completing this!    Nhia Heaphy L. Suetta Hoffmeister, MSN, APRN, AGNP-C Adult-Gerontology Nurse Practitioner [facility] at [facility]

## 2023-07-03 ENCOUNTER — Ambulatory Visit: Payer: PPO | Attending: Rheumatology | Admitting: Rheumatology

## 2023-07-03 ENCOUNTER — Encounter: Payer: Self-pay | Admitting: Rheumatology

## 2023-07-03 VITALS — BP 76/55 | HR 92 | Resp 16 | Ht 65.0 in | Wt 248.6 lb

## 2023-07-03 DIAGNOSIS — M51369 Other intervertebral disc degeneration, lumbar region without mention of lumbar back pain or lower extremity pain: Secondary | ICD-10-CM

## 2023-07-03 DIAGNOSIS — M7061 Trochanteric bursitis, right hip: Secondary | ICD-10-CM

## 2023-07-03 DIAGNOSIS — M19041 Primary osteoarthritis, right hand: Secondary | ICD-10-CM | POA: Diagnosis not present

## 2023-07-03 DIAGNOSIS — Z8669 Personal history of other diseases of the nervous system and sense organs: Secondary | ICD-10-CM

## 2023-07-03 DIAGNOSIS — M19071 Primary osteoarthritis, right ankle and foot: Secondary | ICD-10-CM | POA: Diagnosis not present

## 2023-07-03 DIAGNOSIS — M797 Fibromyalgia: Secondary | ICD-10-CM

## 2023-07-03 DIAGNOSIS — Z8673 Personal history of transient ischemic attack (TIA), and cerebral infarction without residual deficits: Secondary | ICD-10-CM

## 2023-07-03 DIAGNOSIS — M62838 Other muscle spasm: Secondary | ICD-10-CM | POA: Diagnosis not present

## 2023-07-03 DIAGNOSIS — Z96653 Presence of artificial knee joint, bilateral: Secondary | ICD-10-CM

## 2023-07-03 DIAGNOSIS — Z9884 Bariatric surgery status: Secondary | ICD-10-CM

## 2023-07-03 DIAGNOSIS — Q2112 Patent foramen ovale: Secondary | ICD-10-CM | POA: Diagnosis not present

## 2023-07-03 DIAGNOSIS — M19042 Primary osteoarthritis, left hand: Secondary | ICD-10-CM

## 2023-07-03 DIAGNOSIS — M7062 Trochanteric bursitis, left hip: Secondary | ICD-10-CM

## 2023-07-03 DIAGNOSIS — M5134 Other intervertebral disc degeneration, thoracic region: Secondary | ICD-10-CM

## 2023-07-03 DIAGNOSIS — Z8672 Personal history of thrombophlebitis: Secondary | ICD-10-CM | POA: Diagnosis not present

## 2023-07-03 DIAGNOSIS — Z8639 Personal history of other endocrine, nutritional and metabolic disease: Secondary | ICD-10-CM

## 2023-07-03 DIAGNOSIS — M19072 Primary osteoarthritis, left ankle and foot: Secondary | ICD-10-CM

## 2023-07-03 MED ORDER — TRIAMCINOLONE ACETONIDE 40 MG/ML IJ SUSP
20.0000 mg | INTRAMUSCULAR | Status: AC | PRN
  Administered 2023-07-03: 20 mg via INTRAMUSCULAR

## 2023-07-03 MED ORDER — LIDOCAINE HCL 1 % IJ SOLN
0.5000 mL | INTRAMUSCULAR | Status: AC | PRN
  Administered 2023-07-03: .5 mL

## 2023-07-03 NOTE — Patient Instructions (Signed)
 Cervical Strain and Sprain Rehab Ask your health care provider which exercises are safe for you. Do exercises exactly as told by your health care provider and adjust them as directed. It is normal to feel mild stretching, pulling, tightness, or discomfort as you do these exercises. Stop right away if you feel sudden pain or your pain gets worse. Do not begin these exercises until told by your health care provider. Stretching and range-of-motion exercises Cervical side bending  Using good posture, sit on a stable chair or stand up. Without moving your shoulders, slowly tilt your left / right ear to your shoulder until you feel a stretch in the neck muscles on the opposite side. You should be looking straight ahead. Hold for __________ seconds. Repeat with the other side of your neck. Repeat __________ times. Complete this exercise __________ times a day. Cervical rotation  Using good posture, sit on a stable chair or stand up. Slowly turn your head to the side as if you are looking over your left / right shoulder. Keep your eyes level with the ground. Stop when you feel a stretch along the side and the back of your neck. Hold for __________ seconds. Repeat this by turning to your other side. Repeat __________ times. Complete this exercise __________ times a day. Thoracic extension and pectoral stretch  Roll a towel or a small blanket so it is about 4 inches (10 cm) in diameter. Lie down on your back on a firm surface. Put the towel in the middle of your back across your spine. It should not be under your shoulder blades. Put your hands behind your head and let your elbows fall out to your sides. Hold for __________ seconds. Repeat __________ times. Complete this exercise __________ times a day. Strengthening exercises Upper cervical flexion  Lie on your back with a thin pillow behind your head or a small, rolled-up towel under your neck. Gently tuck your chin toward your chest and nod  your head down to look toward your feet. Do not lift your head off the pillow. Hold for __________ seconds. Release the tension slowly. Relax your neck muscles completely before you repeat this exercise. Repeat __________ times. Complete this exercise __________ times a day. Cervical extension  Stand about 6 inches (15 cm) away from a wall, with your back facing the wall. Place a soft object, about 6-8 inches (15-20 cm) in diameter, between the back of your head and the wall. A soft object could be a small pillow, a ball, or a folded towel. Gently tilt your head back and press into the soft object. Keep your jaw and forehead relaxed. Hold for __________ seconds. Release the tension slowly. Relax your neck muscles completely before you repeat this exercise. Repeat __________ times. Complete this exercise __________ times a day. Posture and body mechanics Body mechanics refer to the movements and positions of your body while you do your daily activities. Posture is part of body mechanics. Good posture and healthy body mechanics can help to relieve stress in your body's tissues and joints. Good posture means that your spine is in its natural S-curve position (your spine is neutral), your shoulders are pulled back slightly, and your head is not tipped forward. The following are general guidelines for using improved posture and body mechanics in your everyday activities. Sitting  When sitting, keep your spine neutral and keep your feet flat on the floor. Use a footrest, if needed, and keep your thighs parallel to the floor. Avoid rounding  your shoulders. Avoid tilting your head forward. When working at a desk or a computer, keep your desk at a height where your hands are slightly lower than your elbows. Slide your chair under your desk so you are close enough to maintain good posture. When working at a computer, place your monitor at a height where you are looking straight ahead and you do not have to  tilt your head forward or downward to look at the screen. Standing  When standing, keep your spine neutral and keep your feet about hip-width apart. Keep a slight bend in your knees. Your ears, shoulders, and hips should line up. When you do a task in which you stand in one place for a long time, place one foot up on a stable object that is 2-4 inches (5-10 cm) high, such as a footstool. This helps keep your spine neutral. Resting When lying down and resting, avoid positions that are most painful for you. Try to support your neck in a neutral position. You can use a contour pillow or a small rolled-up towel. Your pillow should support your neck but not push on it. This information is not intended to replace advice given to you by your health care provider. Make sure you discuss any questions you have with your health care provider. Document Revised: 05/14/2022 Document Reviewed: 07/31/2021 Elsevier Patient Education  2024 [employer].

## 2023-07-17 ENCOUNTER — Telehealth (INDEPENDENT_AMBULATORY_CARE_PROVIDER_SITE_OTHER): Payer: Self-pay | Admitting: Gastroenterology

## 2023-07-17 ENCOUNTER — Other Ambulatory Visit (INDEPENDENT_AMBULATORY_CARE_PROVIDER_SITE_OTHER): Payer: Self-pay | Admitting: Gastroenterology

## 2023-07-17 DIAGNOSIS — Z9884 Bariatric surgery status: Secondary | ICD-10-CM

## 2023-07-17 DIAGNOSIS — K219 Gastro-esophageal reflux disease without esophagitis: Secondary | ICD-10-CM

## 2023-07-17 NOTE — Telephone Encounter (Signed)
 Fax from [facility] requesting refill on Metoclopramide  5 mg tablets take one tablet po BID Per last visit, -Continue reglan  5mg , you may want to try decreasing dose of this and using as needed as we try to avoid long term use of this medication  Pt last seen 07/02/23

## 2023-07-17 NOTE — Telephone Encounter (Signed)
 Noted

## 2023-07-23 ENCOUNTER — Encounter (HOSPITAL_COMMUNITY): Payer: Self-pay

## 2023-07-23 ENCOUNTER — Other Ambulatory Visit: Payer: Self-pay

## 2023-07-23 ENCOUNTER — Ambulatory Visit (HOSPITAL_COMMUNITY): Attending: Orthopedic Surgery

## 2023-07-23 DIAGNOSIS — Z7409 Other reduced mobility: Secondary | ICD-10-CM | POA: Insufficient documentation

## 2023-07-23 DIAGNOSIS — M546 Pain in thoracic spine: Secondary | ICD-10-CM | POA: Insufficient documentation

## 2023-07-23 DIAGNOSIS — M549 Dorsalgia, unspecified: Secondary | ICD-10-CM | POA: Insufficient documentation

## 2023-07-23 DIAGNOSIS — M5459 Other low back pain: Secondary | ICD-10-CM | POA: Diagnosis not present

## 2023-07-23 NOTE — Therapy (Signed)
 OUTPATIENT PHYSICAL THERAPY THORACOLUMBAR EVALUATION   Patient Name: Debbie Pineda MRN: [id number] DOB:[birth year], 63 y.o., female Today's Date: 07/24/2023  END OF SESSION:  PT End of Session - 07/23/23 1614     Visit Number 1    Date for PT Re-Evaluation 09/03/23    Authorization Type [employer]    Authorization Time Period no auth required    PT Start Time 1430    PT Stop Time 1515    PT Time Calculation (min) 45 min    Activity Tolerance Patient tolerated treatment well;Patient limited by pain    Behavior During Therapy [facility] for tasks assessed/performed          Past Medical History:  Diagnosis Date   ADD (attention deficit disorder)    Arthritis    all over   Cerebral infarction (HCC) 10/30/2011   Cerebrovascular disease 08/14/2016   Chronic back pain    all over   Chronic low back pain 01/11/2016   Complication of anesthesia    tends to have hypotension when NPO and post-anesthesia   Constipation    takes stool softener daily   Degenerative disk disease    Degenerative joint disease    DVT (deep venous thrombosis) (HCC) 2014   RLE   Family history of adverse reaction to anesthesia    a family member woke up during surgery; think it was my mom   Fibromyalgia    Generalized osteoarthritis of multiple sites 11/04/2013   GERD (gastroesophageal reflux disease)    Headache    Heart palpitations 12/14/2013   resolved   History of blood clots    superficial   Hypoglycemia    Hypothyroid    takes Synthroid  daily   Incomplete emptying of bladder    Insomnia    takes Trazodone  nightly   Iron  deficiency anemia    takes Ferrous Sulfate  daily   Joint pain    Joint swelling    knees and ankles   Memory disorder 08/14/2016   Morbid obesity (HCC)    Nausea    takes Zofran  as needed.Seeing GI doc   Neck pain 05/25/2016   OSA on CPAP    tested more than 5 yrs ago.     Osteoarthritis    Osteopenia    in feet   PFO (patent foramen ovale)     no murmur   PFO (patent foramen ovale)    Pre-diabetes    Primary osteoarthritis of both feet 05/29/2016   Right bunionectomy August 2017 by Dr. Harden   PVC's (premature ventricular contractions) 10/11/2021   Monitor 9/23: 1.8% PVCs; rare PACs; o/w NSR   Scoliosis    Skin abnormality 02/04/2020   raised area on lip   Stroke [facility]) severallast 2014   right foot weakness; memory issues, black spot right visual field since (03/23/2015)   Thrombophlebitis    Trochanteric bursitis of both hips 05/25/2016   Unilateral primary osteoarthritis, right knee 05/29/2016   Urinary urgency 04/19/2016   Vein disorder 11/29/2010   varicose veins both legs   Wears glasses    Past Surgical History:  Procedure Laterality Date   BIOPSY  11/14/2018   Procedure: BIOPSY;  Surgeon: Golda Claudis PENNER, MD;  [facility];  Service: Endoscopy;;  esophagusgastric   BIOPSY  06/07/2022   Procedure: BIOPSY;  Surgeon: Eartha Angelia Sieving, MD;  [facility];  Service: Gastroenterology;;   BONE EXCISION Right 08/29/2017   Procedure: right trapezium excision;  Surgeon: Murrell Kuba,  MD;  Location: [facility];  Service: Orthopedics;  Laterality: Right;   BUNIONECTOMY Right 08/2015   CARDIAC CATHETERIZATION     2008.  it was fine (not sure why she had it done, and doesn't know where)   CARPOMETACARPEL SUSPENSION PLASTY Right 08/29/2017   Procedure: SUSPENSION PLASTY RIGHT THUMB;  Surgeon: Murrell Kuba, MD;  Location: [facility];  Service: Orthopedics;  Laterality: Right;   COLONOSCOPY N/A 03/25/2013   Procedure: COLONOSCOPY;  Surgeon: Claudis RAYMOND Rivet, MD;  [facility];  Service: Endoscopy;  Laterality: N/A;  930   ERCP  06/02/2020   ESOPHAGOGASTRODUODENOSCOPY     ESOPHAGOGASTRODUODENOSCOPY (EGD) WITH PROPOFOL  N/A 11/14/2018   Procedure: ESOPHAGOGASTRODUODENOSCOPY (EGD) WITH PROPOFOL ;  Surgeon: Rivet Claudis RAYMOND, MD;  [facility];   Service: Endoscopy;  Laterality: N/A;  1:55pm-office moved to 11:00am/pt notified to arrive at 9:30am per KF   ESOPHAGOGASTRODUODENOSCOPY (EGD) WITH PROPOFOL  N/A 06/07/2022   Procedure: ESOPHAGOGASTRODUODENOSCOPY (EGD) WITH PROPOFOL ;  Surgeon: Eartha Angelia Sieving, MD;  [facility];  Service: Gastroenterology;  Laterality: N/A;  2:00pm;asa 3   EXPLORATORY LAPAROTOMY     took fallopian tubes out   HALLUX FUSION Right 02/12/2020   Procedure: HALLUX INTERPHANGEAL JOINT  FUSION;  Surgeon: Tobie Franky SQUIBB, DPM;  Location: [facility];  Service: Podiatry;  Laterality: Right;   HAMMER TOE SURGERY Right 02/12/2020   Procedure: SECOND AND THIRD HAMMER TOE CORRECTION; CAPSULOTOMY SECOND INTERPHALANGEAL JOINT;  Surgeon: Tobie Franky SQUIBB, DPM;  Location: [facility];  Service: Podiatry;  Laterality: Right;   JOINT REPLACEMENT     bil knee    KNEE ARTHROSCOPY Left    KNEE ARTHROSCOPY W/ ACL RECONSTRUCTION Right yrs ago   added pins   LAPAROSCOPIC CHOLECYSTECTOMY  ~ 2001   ROUX-EN-Y GASTRIC BYPASS  11/20/2010   [place]   SPINAL CORD STIMULATOR INSERTION N/A 04/18/2017   Procedure: LUMBAR SPINAL CORD STIMULATOR INSERTION;  Surgeon: Mindi Mt, MD;  Location[facility];  Service: Neurosurgery;  Laterality: N/A;  LUMBAR SPINAL CORD STIMULATOR INSERTION   stomach stent  04/28/2020   TENDON TRANSFER Right 08/29/2017   Procedure: right abductor pollicis longus transfer;  Surgeon: Murrell Kuba, MD;  Location: [facility];  Service: Orthopedics;  Laterality: Right;   TOTAL KNEE ARTHROPLASTY Left 03/23/2015   Procedure: TOTAL KNEE ARTHROPLASTY;  Surgeon: Jerona Harden GAILS, MD;  Location: [facility];  Service: Orthopedics;  Laterality: Left;   TOTAL KNEE ARTHROPLASTY Right 08/15/2016   Procedure: RIGHT TOTAL KNEE ARTHROPLASTY, REMOVAL ACL SCREWS;  Surgeon: Harden Jerona GAILS, MD;  Location: [facility];  Service: Orthopedics;  Laterality: Right;   TOTAL KNEE ARTHROPLASTY  WITH HARDWARE REMOVAL Right    UPPER GI ENDOSCOPY  12/06/2022   VAGINAL HYSTERECTOMY     tah/bso   VARICOSE VEIN SURGERY Right X 2   WEIL OSTEOTOMY Right 02/12/2020   Procedure: DOUBLE L OSTEOTOMY;  Surgeon: Tobie Franky SQUIBB, DPM;  Location: [facility];  Service: Podiatry;  Laterality: Right;   Patient Active Problem List   Diagnosis Date Noted   Nausea without vomiting 07/02/2023   Encounter for screening colonoscopy 07/02/2023   Belching 03/12/2023   History of seizures 07/10/2022   Melena 05/15/2022   PVC's (premature ventricular contractions) 10/11/2021   Factor V Leiden mutation (HCC) 10/11/2021   Other secondary scoliosis, lumbar region    Other spondylosis with radiculopathy, lumbar region    Spondylolisthesis, lumbar region  Thoracic spondylosis with radiculopathy    Radiculopathy, lumbar region    Breakdown (mechanical) of implanted electronic neurostimulator of spinal cord electrode (lead), sequela    Degenerative disc disease, lumbar    At risk for adverse drug event 08/31/2021   Acute on chronic blood loss anemia 08/28/2021   Drug-induced constipation 08/28/2021   GERD without esophagitis 08/28/2021   Overactive bladder 08/28/2021   Chronic migraine without aura 08/28/2021   Hx of blood clots 08/28/2021   Major neurocognitive disorder (HCC) 08/28/2021   Status post lumbar spinal fusion 08/21/2021   Weight gain 07/13/2021   Encounter for removal of internal fixation device 10/21/2020   LFTs abnormal 03/02/2020   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   Gastroesophageal reflux disease 09/22/2019   SSBE (short-segment Barrett's esophagus) 09/22/2019   Flatulence 09/22/2019   Migraine 11/11/2018   Abdominal pain, epigastric 10/14/2018   LUQ pain 10/14/2018   Attention deficit hyperactivity disorder (ADHD) 01/12/2018   Insomnia 01/12/2018   Elevated liver enzymes 09/10/2017   History of diabetes mellitus 06/06/2017    Primary osteoarthritis of right hand 06/06/2017   Transaminasemia    TIA (transient ischemic attack) 05/01/2017   Dysphasia 05/01/2017   Chronic pain syndrome 05/01/2017   Confusion    Presence of right artificial knee joint 09/17/2016   H/O total knee replacement, right 08/15/2016   Presence of retained hardware    Memory disorder 08/14/2016   Cerebrovascular disease 08/14/2016   Unilateral primary osteoarthritis, right knee 05/29/2016   Primary osteoarthritis of both feet 05/29/2016   Trochanteric bursitis of both hips 05/25/2016   Neck pain 05/25/2016   Urinary urgency 04/19/2016   Chronic low back pain 01/11/2016   Total knee replacement status 03/23/2015   Heart palpitations 12/14/2013   Generalized osteoarthritis of multiple sites 11/04/2013   Elevated LFTs 03/17/2012   Cerebral infarction, unspecified (HCC) 11/29/2011   Cerebral infarction (HCC) 10/30/2011   PFO (patent foramen ovale) 10/30/2011   Acquired hypothyroidism    Thrombophlebitis    OSA on CPAP    Fibromyalgia    Status post bariatric surgery 12/07/2010   Vein disorder 11/29/2010   S/P total hysterectomy and bilateral salpingo-oophorectomy 11/29/2010   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   S/P cholecystectomy 11/29/2010   S/P ACL surgery 11/29/2010   Morbid obesity (HCC) 11/10/2010    PCP: Leonce Lucie PARAS, PA-C   REFERRING PROVIDER:  Georgina Ozell LABOR, MD    REFERRING DIAG: M54.9 (ICD-10-CM) - Mid back pain  Rationale for Evaluation and Treatment: Rehabilitation  THERAPY DIAG:  Pain in thoracic spine - Plan: PT plan of care cert/re-cert  Other low back pain - Plan: PT plan of care cert/re-cert  Impaired functional mobility, balance, gait, and endurance - Plan: PT plan of care cert/re-cert  ONSET DATE: October 2023, back surgery   SUBJECTIVE:  SUBJECTIVE STATEMENT: Pt reports mid to low back pain. Pt states she had back surgery and states the pain has gotten worse over the past few months. Pt states when she is up moving around she tends to slouched forward. Pt states she needs strengthening in her back muscles to avoid bending forward. Pt states she has been using SPC for years even before back surgery  PERTINENT HISTORY:  -Pt has had several strokes last one in 2014, right foot drags, memory issues -Diabetes, controlled with diet -Neuropathy -Arthritis -Fibromyalgia   PAIN:  Are you having pain? Yes: NPRS scale: 4/10 medicated now, worst in last week 8/10 Pain location: low and mid back pain Pain description: ache, dull and sharp on occasion Aggravating factors: standing up cooking, walking, sitting too long, Relieving factors: bending forward, extension with self pressure to low back, massage, heat, medications, lays on left side  PRECAUTIONS: None  RED FLAGS: Bowel or bladder incontinence: Yes: years   WEIGHT BEARING RESTRICTIONS: No  FALLS:  Has patient fallen in last 6 months? Yes. Number of falls 1, just loss of balance  OCCUPATION: disability  PLOF: Independent with household mobility with device, Requires assistive device for independence, and Needs assistance with homemaking, pt lives with mother, and husband  PATIENT GOALS: strengthen low/mid back, increase activity tolerance, improve walking and balance  NEXT MD VISIT: after therapy  OBJECTIVE:  Note: Objective measures were completed at Evaluation unless otherwise noted.  DIAGNOSTIC FINDINGS:  XRs of the thoracic spine from 06/24/2023 were independently reviewed and  interpreted, showing posterior instrumentation to T10. Several of the  screws appear to breach the superior endplate of their respective  vertebra. The T10 screws have lucency around them. There is kyphosis above  her fusion that  measures 14 degrees. Her films immediately after surgery  measure about 12 degrees of kyphosis ([place]attes). No fracture or dislocation  seen.  PATIENT SURVEYS:  Modified Oswestry: TBA  COGNITION: Overall cognitive status: memory issues     SENSATION: Light touch: Impaired , just feet, sensitivity to cold  POSTURE: rounded shoulders, forward head, decreased lumbar lordosis, increased thoracic kyphosis, and flexed trunk   PALPATION: Pt demonstrates tenderness to palpation of spinous processes of T5-T6 and L4-L5. Pt also demonstrates increased tone on right side of thoracic and lumbar spine.  THORACOLUMBAR ROM:   AROM eval  Flexion   Extension   Right lateral flexion 30 degrees, Pain, worse  Left lateral flexion 35 degrees, pain  Right rotation   Left rotation    (Blank rows = not tested)  LOWER EXTREMITY ROM:     Active  Right eval Left eval  Hip flexion    Hip extension    Hip abduction    Hip adduction    Hip internal rotation    Hip external rotation    Knee flexion    Knee extension    Ankle dorsiflexion    Ankle plantarflexion    Ankle inversion    Ankle eversion     (Blank rows = not tested)  LOWER EXTREMITY MMT:    MMT Right eval Left eval  Hip flexion 4 3  Hip extension 4 4  Hip abduction 4 4  Hip adduction 4 4  Hip internal rotation    Hip external rotation    Knee flexion 4 4  Knee extension 4 4  Ankle dorsiflexion 4 4  Ankle plantarflexion    Ankle inversion    Ankle eversion     (Blank rows =  not tested)    FUNCTIONAL TESTS:  5 times sit to stand: 9.01 seconds 2 minute walk test: 351 feet  GAIT: Distance walked: 400 feet Assistive device utilized: Single point cane and None Level of assistance: Modified independence Comments: increased flexion of trunk as test progressed  TREATMENT DATE:  07/23/2023   Evaluation: -ROM measured, Strength assessed, HEP prescribed, pt educated on prognosis, findings, and importance of HEP  compliance if given.   PATIENT EDUCATION:  Education details: Pt was educated on findings of PT evaluation, prognosis, frequency of therapy visits and rationale, attendance policy, and HEP if given.   Person educated: Patient Education method: Explanation, Verbal cues, and Handouts Education comprehension: verbalized understanding, verbal cues required, and needs further education  HOME EXERCISE PROGRAM: Access Code: [email or web address] URL: [email or web address] Date: 07/23/2023 Prepared by: Lang Ada  Exercises - Supine Bridge  - 1 x daily - 7 x weekly - 3 sets - 10 reps - Supine Lower Trunk Rotation  - 1 x daily - 7 x weekly - 3 sets - 10 reps - Seated Scapular Retraction  - 1 x daily - 7 x weekly - 3 sets - 10 reps  ASSESSMENT:  CLINICAL IMPRESSION: Patient is a 63 y.o. female who was seen today for physical therapy evaluation and treatment for M54.9 (ICD-10-CM) - Mid back pain.   Patient demonstrates decreased core/LE strength, abnormal pain rating in thoracic and lumbar spine, abnormal gait, and impaired balance. Patient also demonstrates difficulty with ambulation during today's session with decreased stride length and velocity noted. Patient also demonstrates ability to complete the without an AD although gait deviations became more apparent at test progressed. Patient requires education on role of PT, prognosis, HEP and compliance. Patient would benefit from skilled physical therapy for increased endurance with ambulation, increased core/LE strength, and balance for improved gait quality, return to higher level of function with ADLs, and progress towards therapy goals.   OBJECTIVE IMPAIRMENTS: Abnormal gait, decreased activity tolerance, decreased balance, decreased endurance, decreased mobility, difficulty walking, decreased ROM, decreased strength, postural dysfunction, and pain.   ACTIVITY LIMITATIONS: carrying, lifting, squatting, transfers, bed mobility,  continence, and locomotion level  PARTICIPATION LIMITATIONS: meal prep, cleaning, laundry, shopping, community activity, and yard work  PERSONAL FACTORS: Fitness, Past/current experiences, Time since onset of injury/illness/exacerbation, and 1-2 comorbidities: hx of back surgery, diabetes are also affecting patient's functional outcome.   REHAB POTENTIAL: Fair chronic in nature  CLINICAL DECISION MAKING: Evolving/moderate complexity  EVALUATION COMPLEXITY: Moderate   GOALS: Goals reviewed with patient? No  SHORT TERM GOALS: Target date: 07/22/225  Pt will be independent with HEP in order to demonstrate participation in Physical Therapy POC.  Baseline: Goal status: INITIAL  2.  Pt will report 3/10 pain at worst with mobility in order to demonstrate improved pain with ADLs.  Baseline:  Goal status: INITIAL  LONG TERM GOALS: Target date: 09/03/23  Pt will improve 5TSTS by at least 2.3 seconds in order to demonstrate improved functional strength to return to desired activities.  Baseline: see objective.  Goal status: INITIAL  2.  Pt will improve 2 MWT by 50 feet without AD in order to demonstrate improved functional ambulatory capacity in community setting.  Baseline: see objective.  Goal status: INITIAL  3.  Pt will improve Modified Oswestry score by at least 6 points in order to demonstrate improved pain with functional goals and outcomes. Baseline: see objective.  Goal status: INITIAL  4.  Pt will report 1/10 pain at worst with mobility  in order to demonstrate reduced pain with ADLs lasting greater than 30 minutes.  Baseline: see objective.  Goal status: INITIAL   PLAN:  PT FREQUENCY: 1-2x/week  PT DURATION: 6 weeks  PLANNED INTERVENTIONS: 97110-Therapeutic exercises, 97530- Therapeutic activity, [id number]- Neuromuscular re-education, 97535- Self Care, 02859- Manual therapy, [phone]- Gait training, Patient/Family education, Balance training, Stair training, Spinal  mobilization, DME instructions, Cryotherapy, and Moist heat.  PLAN FOR NEXT SESSION: Modified Oswestry, progress lumbar and thoracic spine ROM and strengthening of paraspinals, progress gait/balance training   Lang Ada, PT, DPT [facility] Office: [phone] 1:51 PM, 07/24/23

## 2023-07-25 DIAGNOSIS — M47816 Spondylosis without myelopathy or radiculopathy, lumbar region: Secondary | ICD-10-CM | POA: Diagnosis not present

## 2023-07-25 DIAGNOSIS — M5416 Radiculopathy, lumbar region: Secondary | ICD-10-CM | POA: Diagnosis not present

## 2023-07-25 DIAGNOSIS — G894 Chronic pain syndrome: Secondary | ICD-10-CM | POA: Diagnosis not present

## 2023-07-25 DIAGNOSIS — M79671 Pain in right foot: Secondary | ICD-10-CM | POA: Diagnosis not present

## 2023-07-29 ENCOUNTER — Other Ambulatory Visit: Payer: Self-pay | Admitting: Neurology

## 2023-08-05 ENCOUNTER — Telehealth: Payer: Self-pay | Admitting: Neurology

## 2023-08-05 ENCOUNTER — Ambulatory Visit: Admitting: Orthopedic Surgery

## 2023-08-05 ENCOUNTER — Other Ambulatory Visit (HOSPITAL_COMMUNITY): Payer: Self-pay | Admitting: General Surgery

## 2023-08-05 DIAGNOSIS — K316 Fistula of stomach and duodenum: Secondary | ICD-10-CM

## 2023-08-05 DIAGNOSIS — Q2112 Patent foramen ovale: Secondary | ICD-10-CM

## 2023-08-05 MED ORDER — METAFOLBIC PLUS 6-2-600 MG PO TABS: 1.0000 | ORAL_TABLET | Freq: Every day | ORAL | 11 refills | Status: AC

## 2023-08-05 NOTE — Telephone Encounter (Signed)
 Refilled

## 2023-08-05 NOTE — Telephone Encounter (Signed)
 Pharmacy called to request order for Pt refill for medication  Methylfol-Methylcob-Acetylcyst (METAFOLBIC PLUS) 6-2-600 MG TABS     [facility] [phone] - [place], [place] - [address] AT [facility] [address] & [address] Phone: [phone]  Fax: [phone]

## 2023-08-08 ENCOUNTER — Encounter (HOSPITAL_COMMUNITY)

## 2023-08-09 DIAGNOSIS — I071 Rheumatic tricuspid insufficiency: Secondary | ICD-10-CM | POA: Diagnosis not present

## 2023-08-09 DIAGNOSIS — K316 Fistula of stomach and duodenum: Secondary | ICD-10-CM | POA: Diagnosis not present

## 2023-08-09 DIAGNOSIS — Z6841 Body Mass Index (BMI) 40.0 and over, adult: Secondary | ICD-10-CM | POA: Diagnosis not present

## 2023-08-09 DIAGNOSIS — Q2112 Patent foramen ovale: Secondary | ICD-10-CM | POA: Diagnosis not present

## 2023-08-09 DIAGNOSIS — Z8774 Personal history of (corrected) congenital malformations of heart and circulatory system: Secondary | ICD-10-CM | POA: Diagnosis not present

## 2023-08-09 DIAGNOSIS — I371 Nonrheumatic pulmonary valve insufficiency: Secondary | ICD-10-CM | POA: Diagnosis not present

## 2023-08-22 ENCOUNTER — Encounter (HOSPITAL_COMMUNITY): Payer: Self-pay

## 2023-08-22 ENCOUNTER — Ambulatory Visit (HOSPITAL_COMMUNITY)

## 2023-08-22 DIAGNOSIS — Z7409 Other reduced mobility: Secondary | ICD-10-CM

## 2023-08-22 DIAGNOSIS — M546 Pain in thoracic spine: Secondary | ICD-10-CM | POA: Diagnosis not present

## 2023-08-22 DIAGNOSIS — M5459 Other low back pain: Secondary | ICD-10-CM

## 2023-08-22 NOTE — Therapy (Signed)
 OUTPATIENT PHYSICAL THERAPY THORACOLUMBAR TREATMENT   Patient Name: Debbie Pineda MRN: [id number] DOB:September 18, 1960, 63 y.o., female Today's Date: 08/22/2023  END OF SESSION:  PT End of Session - 08/22/23 1430     Visit Number 2    Date for PT Re-Evaluation 09/03/23    Authorization Type [employer]    Authorization Time Period no auth required    PT Start Time 1431    PT Stop Time 1512    PT Time Calculation (min) 41 min    Activity Tolerance Patient tolerated treatment well    Behavior During Therapy WFL for tasks assessed/performed          Past Medical History:  Diagnosis Date   ADD (attention deficit disorder)    Arthritis    all over   Cerebral infarction (HCC) 10/30/2011   Cerebrovascular disease 08/14/2016   Chronic back pain    all over   Chronic low back pain 01/11/2016   Complication of anesthesia    tends to have hypotension when NPO and post-anesthesia   Constipation    takes stool softener daily   Degenerative disk disease    Degenerative joint disease    DVT (deep venous thrombosis) (HCC) 2014   RLE   Family history of adverse reaction to anesthesia    a family member woke up during surgery; think it was my mom   Fibromyalgia    Generalized osteoarthritis of multiple sites 11/04/2013   GERD (gastroesophageal reflux disease)    Headache    Heart palpitations 12/14/2013   resolved   History of blood clots    superficial   Hypoglycemia    Hypothyroid    takes Synthroid  daily   Incomplete emptying of bladder    Insomnia    takes Trazodone  nightly   Iron  deficiency anemia    takes Ferrous Sulfate  daily   Joint pain    Joint swelling    knees and ankles   Memory disorder 08/14/2016   Morbid obesity (HCC)    Nausea    takes Zofran  as needed.Seeing GI doc   Neck pain 05/25/2016   OSA on CPAP    tested more than 5 yrs ago.     Osteoarthritis    Osteopenia    in feet   PFO (patent foramen ovale)    no murmur   PFO  (patent foramen ovale)    Pre-diabetes    Primary osteoarthritis of both feet 05/29/2016   Right bunionectomy August 2017 by Dr. Harden   PVC's (premature ventricular contractions) 10/11/2021   Monitor 9/23: 1.8% PVCs; rare PACs; o/w NSR   Scoliosis    Skin abnormality 02/04/2020   raised area on lip   Stroke [facility]) severallast 2014   right foot weakness; memory issues, black spot right visual field since (03/23/2015)   Thrombophlebitis    Trochanteric bursitis of both hips 05/25/2016   Unilateral primary osteoarthritis, right knee 05/29/2016   Urinary urgency 04/19/2016   Vein disorder 11/29/2010   varicose veins both legs   Wears glasses    Past Surgical History:  Procedure Laterality Date   BIOPSY  11/14/2018   Procedure: BIOPSY;  Surgeon: Golda Claudis PENNER, MD;  [facility];  Service: Endoscopy;;  esophagusgastric   BIOPSY  06/07/2022   Procedure: BIOPSY;  Surgeon: Eartha Angelia Sieving, MD;  [facility]: [facility];  Service: Gastroenterology;;   BONE EXCISION Right 08/29/2017   Procedure: right trapezium excision;  Surgeon: Murrell Kuba, MD;  Location:  [facility];  Service: Orthopedics;  Laterality: Right;   BUNIONECTOMY Right 08/2015   CARDIAC CATHETERIZATION     2008.  it was fine (not sure why she had it done, and doesn't know where)   CARPOMETACARPEL SUSPENSION PLASTY Right 08/29/2017   Procedure: SUSPENSION PLASTY RIGHT THUMB;  Surgeon: Murrell Kuba, MD;  Location: [facility];  Service: Orthopedics;  Laterality: Right;   COLONOSCOPY N/A 03/25/2013   Procedure: COLONOSCOPY;  Surgeon: Claudis RAYMOND Rivet, MD;  [facility]: [facility];  Service: Endoscopy;  Laterality: N/A;  930   ERCP  06/02/2020   ESOPHAGOGASTRODUODENOSCOPY     ESOPHAGOGASTRODUODENOSCOPY (EGD) WITH PROPOFOL  N/A 11/14/2018   Procedure: ESOPHAGOGASTRODUODENOSCOPY (EGD) WITH PROPOFOL ;  Surgeon: Rivet Claudis RAYMOND, MD;  [facility]: [facility];  Service: Endoscopy;   Laterality: N/A;  1:55pm-office moved to 11:00am/pt notified to arrive at 9:30am per KF   ESOPHAGOGASTRODUODENOSCOPY (EGD) WITH PROPOFOL  N/A 06/07/2022   Procedure: ESOPHAGOGASTRODUODENOSCOPY (EGD) WITH PROPOFOL ;  Surgeon: Eartha Angelia Sieving, MD;  [facility]: [facility];  Service: Gastroenterology;  Laterality: N/A;  2:00pm;asa 3   EXPLORATORY LAPAROTOMY     took fallopian tubes out   HALLUX FUSION Right 02/12/2020   Procedure: HALLUX INTERPHANGEAL JOINT  FUSION;  Surgeon: Tobie Franky SQUIBB, DPM;  Location: [facility];  Service: Podiatry;  Laterality: Right;   HAMMER TOE SURGERY Right 02/12/2020   Procedure: SECOND AND THIRD HAMMER TOE CORRECTION; CAPSULOTOMY SECOND INTERPHALANGEAL JOINT;  Surgeon: Tobie Franky SQUIBB, DPM;  Location: [facility];  Service: Podiatry;  Laterality: Right;   JOINT REPLACEMENT     bil knee    KNEE ARTHROSCOPY Left    KNEE ARTHROSCOPY W/ ACL RECONSTRUCTION Right yrs ago   added pins   LAPAROSCOPIC CHOLECYSTECTOMY  ~ 2001   ROUX-EN-Y GASTRIC BYPASS  11/20/2010   [place]   SPINAL CORD STIMULATOR INSERTION N/A 04/18/2017   Procedure: LUMBAR SPINAL CORD STIMULATOR INSERTION;  Surgeon: Mindi Mt, MD;  Location: [facility];  Service: Neurosurgery;  Laterality: N/A;  LUMBAR SPINAL CORD STIMULATOR INSERTION   stomach stent  04/28/2020   TENDON TRANSFER Right 08/29/2017   Procedure: right abductor pollicis longus transfer;  Surgeon: Murrell Kuba, MD;  Location: [facility];  Service: Orthopedics;  Laterality: Right;   TOTAL KNEE ARTHROPLASTY Left 03/23/2015   Procedure: TOTAL KNEE ARTHROPLASTY;  Surgeon: Jerona Harden GAILS, MD;  Location: [facility];  Service: Orthopedics;  Laterality: Left;   TOTAL KNEE ARTHROPLASTY Right 08/15/2016   Procedure: RIGHT TOTAL KNEE ARTHROPLASTY, REMOVAL ACL SCREWS;  Surgeon: Harden Jerona GAILS, MD;  Location: [facility];  Service: Orthopedics;  Laterality: Right;   TOTAL KNEE ARTHROPLASTY WITH HARDWARE REMOVAL  Right    UPPER GI ENDOSCOPY  12/06/2022   VAGINAL HYSTERECTOMY     tah/bso   VARICOSE VEIN SURGERY Right X 2   WEIL OSTEOTOMY Right 02/12/2020   Procedure: DOUBLE L OSTEOTOMY;  Surgeon: Tobie Franky SQUIBB, DPM;  Location: [facility];  Service: Podiatry;  Laterality: Right;   Patient Active Problem List   Diagnosis Date Noted   Nausea without vomiting 07/02/2023   Encounter for screening colonoscopy 07/02/2023   Belching 03/12/2023   History of seizures 07/10/2022   Melena 05/15/2022   PVC's (premature ventricular contractions) 10/11/2021   Factor V Leiden mutation (HCC) 10/11/2021   Other secondary scoliosis, lumbar region    Other spondylosis with radiculopathy, lumbar region    Spondylolisthesis, lumbar region    Thoracic spondylosis  with radiculopathy    Radiculopathy, lumbar region    Breakdown (mechanical) of implanted electronic neurostimulator of spinal cord electrode (lead), sequela    Degenerative disc disease, lumbar    At risk for adverse drug event 08/31/2021   Acute on chronic blood loss anemia 08/28/2021   Drug-induced constipation 08/28/2021   GERD without esophagitis 08/28/2021   Overactive bladder 08/28/2021   Chronic migraine without aura 08/28/2021   Hx of blood clots 08/28/2021   Major neurocognitive disorder (HCC) 08/28/2021   Status post lumbar spinal fusion 08/21/2021   Weight gain 07/13/2021   Encounter for removal of internal fixation device 10/21/2020   LFTs abnormal 03/02/2020   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   Gastroesophageal reflux disease 09/22/2019   SSBE (short-segment Barrett's esophagus) 09/22/2019   Flatulence 09/22/2019   Migraine 11/11/2018   Abdominal pain, epigastric 10/14/2018   LUQ pain 10/14/2018   Attention deficit hyperactivity disorder (ADHD) 01/12/2018   Insomnia 01/12/2018   Elevated liver enzymes 09/10/2017   History of diabetes mellitus 06/06/2017   Primary  osteoarthritis of right hand 06/06/2017   Transaminasemia    TIA (transient ischemic attack) 05/01/2017   Dysphasia 05/01/2017   Chronic pain syndrome 05/01/2017   Confusion    Presence of right artificial knee joint 09/17/2016   H/O total knee replacement, right 08/15/2016   Presence of retained hardware    Memory disorder 08/14/2016   Cerebrovascular disease 08/14/2016   Unilateral primary osteoarthritis, right knee 05/29/2016   Primary osteoarthritis of both feet 05/29/2016   Trochanteric bursitis of both hips 05/25/2016   Neck pain 05/25/2016   Urinary urgency 04/19/2016   Chronic low back pain 01/11/2016   Total knee replacement status 03/23/2015   Heart palpitations 12/14/2013   Generalized osteoarthritis of multiple sites 11/04/2013   Elevated LFTs 03/17/2012   Cerebral infarction, unspecified (HCC) 11/29/2011   Cerebral infarction (HCC) 10/30/2011   PFO (patent foramen ovale) 10/30/2011   Acquired hypothyroidism    Thrombophlebitis    OSA on CPAP    Fibromyalgia    Status post bariatric surgery 12/07/2010   Vein disorder 11/29/2010   S/P total hysterectomy and bilateral salpingo-oophorectomy 11/29/2010   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  Pt has some pain in lower back pain scale 3-4/10 and Both her knees are bothering today with pain scale 5/10.  Eval:  Pt reports mid to low back pain. Pt states she had back surgery and states the pain has gotten worse over the past few months. Pt states when she is up moving around she tends to slouched forward. Pt states she needs strengthening in her back muscles to avoid bending forward. Pt states she has been using SPC for years even before back surgery  PERTINENT HISTORY:  -Pt has had several strokes last one in 2014, right foot drags, memory issues -Diabetes, controlled with diet -Neuropathy -Arthritis -Fibromyalgia   PAIN:  Are you having pain? Yes: NPRS scale: 4/10 medicated now, worst in last week 8/10 Pain location: low and mid back pain Pain description: ache, dull and sharp on occasion Aggravating factors: standing up cooking, walking, sitting too long, Relieving factors: bending forward, extension with self pressure to low back, massage, heat, medications, lays on left side  PRECAUTIONS: None  RED FLAGS: Bowel or bladder incontinence: Yes: years   WEIGHT BEARING RESTRICTIONS: No  FALLS:  Has patient fallen in last 6 months? Yes. Number of falls 1, just loss of balance  OCCUPATION: disability  PLOF: Independent with household mobility with device, Requires assistive device for independence, and Needs assistance with homemaking, pt lives with mother, and husband  PATIENT GOALS: strengthen low/mid back, increase activity tolerance, improve walking and balance  NEXT MD VISIT: after therapy  OBJECTIVE:  Note: Objective measures were completed at Evaluation unless otherwise noted.  DIAGNOSTIC FINDINGS:  XRs of the thoracic spine from 06/24/2023 were independently reviewed and  interpreted, showing posterior instrumentation to T10. Several of the  screws appear to breach the superior endplate of their respective  vertebra. The T10 screws have  lucency around them. There is kyphosis above  her fusion that measures 14 degrees. Her films immediately after surgery  measure about 12 degrees of kyphosis (Glattes). No fracture or dislocation  seen.  PATIENT SURVEYS:  08/22/23:  Modified Oswestry Low Back Pain Disability Questionnaire: 25 / 50 = 50.0  COGNITION: Overall cognitive status: memory issues     SENSATION: Light touch: Impaired , just feet, sensitivity to cold  POSTURE: rounded shoulders, forward head, decreased lumbar lordosis, increased thoracic kyphosis, and flexed trunk   PALPATION: Pt demonstrates tenderness to palpation of spinous processes of T5-T6 and L4-L5. Pt also demonstrates increased tone on right side of thoracic and lumbar spine.  THORACOLUMBAR ROM:   AROM eval  Flexion   Extension   Right lateral flexion 30 degrees, Pain, worse  Left lateral flexion 35 degrees, pain  Right rotation   Left rotation    (Blank rows = not tested)  LOWER EXTREMITY ROM:     Active  Right eval Left eval  Hip flexion    Hip extension    Hip abduction    Hip adduction    Hip internal rotation    Hip external rotation    Knee flexion    Knee extension    Ankle dorsiflexion    Ankle plantarflexion    Ankle inversion    Ankle eversion     (Blank rows = not tested)  LOWER EXTREMITY MMT:    MMT Right eval Left eval  Hip flexion 4 3  Hip extension 4 4  Hip abduction 4 4  Hip adduction 4 4  Hip internal rotation    Hip external rotation    Knee flexion 4 4  Knee extension 4 4  Ankle dorsiflexion 4 4  Ankle plantarflexion    Ankle inversion    Ankle eversion     (Blank rows = not tested)    FUNCTIONAL TESTS:  5 times sit to stand: 9.01 seconds 2 minute walk test: 351 feet  GAIT: Distance walked: 400 feet Assistive device utilized: Single point cane and None Level of assistance: Modified independence Comments: increased flexion of trunk as test progressed  TREATMENT DATE:  08/22/23: Reviewed  goals Educated importance of HEP compliance Modified Oswestry Low Back Pain Disability Questionnaire: 25 / 50 = 50.0 Supine:  Bridge  LTR 5x 10  Decompression 2-5 5x 5 Seated:  Scapular retraction.  Sit to stand 5x  07/23/2023  Evaluation: -ROM measured, Strength assessed, HEP prescribed, pt educated on prognosis, findings, and importance of HEP compliance if given.   PATIENT EDUCATION:  Education details: Pt was educated on findings of PT evaluation, prognosis, frequency of therapy visits and rationale, attendance policy, and HEP if given.   Person educated: Patient Education method: Explanation, Verbal cues, and Handouts Education comprehension: verbalized understanding, verbal cues required, and needs further education  HOME EXERCISE PROGRAM: Access Code: [email or web address] URL: [email or web address] Date: 07/23/2023 Prepared by: Lang Ada  Exercises - Supine Bridge  - 1 x daily - 7 x weekly - 3 sets - 10 reps - Supine Lower Trunk Rotation  - 1 x daily - 7 x weekly - 3 sets - 10 reps - Seated Scapular Retraction  - 1 x daily - 7 x weekly - 3 sets - 10 reps  08/22/23: Decompression  ASSESSMENT:  CLINICAL IMPRESSION: 08/22/23:  Reviewed goals and educated importance of HEP compliance, pt requested new copy and reviewed form and mechanics with current exercise program.  Modified Oswestry Low Back Pain Disability Questionnaire complete with score of:  25 / 50 = 50.0.  Session focus with lumbar mobility and proximal strengthening.  Added decompression for posterior chain and postural strengthening with positive feedback.  No reports of increased pain through session, did c/o hamstring cramping during bridges.  Eval:  Patient is a 63 y.o. female who was seen today for physical therapy evaluation and treatment for M54.9 (ICD-10-CM) - Mid back pain.   Patient demonstrates decreased core/LE strength, abnormal pain rating in thoracic and lumbar spine, abnormal gait, and  impaired balance. Patient also demonstrates difficulty with ambulation during today's session with decreased stride length and velocity noted. Patient also demonstrates ability to complete the without an AD although gait deviations became more apparent at test progressed. Patient requires education on role of PT, prognosis, HEP and compliance. Patient would benefit from skilled physical therapy for increased endurance with ambulation, increased core/LE strength, and balance for improved gait quality, return to higher level of function with ADLs, and progress towards therapy goals.   OBJECTIVE IMPAIRMENTS: Abnormal gait, decreased activity tolerance, decreased balance, decreased endurance, decreased mobility, difficulty walking, decreased ROM, decreased strength, postural dysfunction, and pain.   ACTIVITY LIMITATIONS: carrying, lifting, squatting, transfers, bed mobility, continence, and locomotion level  PARTICIPATION LIMITATIONS: meal prep, cleaning, laundry, shopping, community activity, and yard work  PERSONAL FACTORS: Fitness, Past/current experiences, Time since onset of injury/illness/exacerbation, and 1-2 comorbidities: hx of back surgery, diabetes are also affecting patient's functional outcome.   REHAB POTENTIAL: Fair chronic in nature  CLINICAL DECISION MAKING: Evolving/moderate complexity  EVALUATION COMPLEXITY: Moderate   GOALS: Goals reviewed with  patient? No  SHORT TERM GOALS: Target date: 07/22/225  Pt will be independent with HEP in order to demonstrate participation in Physical Therapy POC.  Baseline: Goal status: INITIAL  2.  Pt will report 3/10 pain at worst with mobility in order to demonstrate improved pain with ADLs.  Baseline:  Goal status: INITIAL  LONG TERM GOALS: Target date: 09/03/23  Pt will improve 5TSTS by at least 2.3 seconds in order to demonstrate improved functional strength to return to desired activities.  Baseline: see objective.  Goal  status: INITIAL  2.  Pt will improve 2 MWT by 50 feet without AD in order to demonstrate improved functional ambulatory capacity in community setting.  Baseline: see objective.  Goal status: INITIAL  3.  Pt will improve Modified Oswestry score by at least 6 points in order to demonstrate improved pain with functional goals and outcomes. Baseline: see objective.  Goal status: INITIAL  4.  Pt will report 1/10 pain at worst with mobility in order to demonstrate reduced pain with ADLs lasting greater than 30 minutes.  Baseline: see objective.  Goal status: INITIAL   PLAN:  PT FREQUENCY: 1-2x/week  PT DURATION: 6 weeks  PLANNED INTERVENTIONS: 97110-Therapeutic exercises, 97530- Therapeutic activity, [id number]- Neuromuscular re-education, 97535- Self Care, [id number]- Manual therapy, [phone]- Gait training, Patient/Family education, Balance training, Stair training, Spinal mobilization, DME instructions, Cryotherapy, and Moist heat.  PLAN FOR NEXT SESSION: Progress lumbar and thoracic spine ROM and strengthening of paraspinals, progress gait/balance training.  Add seated posture strengthening with theraband.  Progress glut and core stability.  Augustin Mclean, LPTA/CLT; WILLAIM [phone]  3:18 PM, 08/22/23

## 2023-08-27 ENCOUNTER — Encounter (HOSPITAL_COMMUNITY)

## 2023-08-28 DIAGNOSIS — M5416 Radiculopathy, lumbar region: Secondary | ICD-10-CM | POA: Diagnosis not present

## 2023-08-28 DIAGNOSIS — G894 Chronic pain syndrome: Secondary | ICD-10-CM | POA: Diagnosis not present

## 2023-08-28 DIAGNOSIS — M47816 Spondylosis without myelopathy or radiculopathy, lumbar region: Secondary | ICD-10-CM | POA: Diagnosis not present

## 2023-08-28 DIAGNOSIS — M79671 Pain in right foot: Secondary | ICD-10-CM | POA: Diagnosis not present

## 2023-08-29 ENCOUNTER — Ambulatory Visit (HOSPITAL_COMMUNITY): Attending: Orthopedic Surgery | Admitting: Physical Therapy

## 2023-08-29 DIAGNOSIS — Z7409 Other reduced mobility: Secondary | ICD-10-CM | POA: Diagnosis not present

## 2023-08-29 DIAGNOSIS — M5459 Other low back pain: Secondary | ICD-10-CM | POA: Diagnosis not present

## 2023-08-29 DIAGNOSIS — F902 Attention-deficit hyperactivity disorder, combined type: Secondary | ICD-10-CM | POA: Diagnosis not present

## 2023-08-29 DIAGNOSIS — E6609 Other obesity due to excess calories: Secondary | ICD-10-CM | POA: Diagnosis not present

## 2023-08-29 DIAGNOSIS — Z6839 Body mass index (BMI) 39.0-39.9, adult: Secondary | ICD-10-CM | POA: Diagnosis not present

## 2023-08-29 DIAGNOSIS — M545 Low back pain, unspecified: Secondary | ICD-10-CM | POA: Diagnosis not present

## 2023-08-29 DIAGNOSIS — M546 Pain in thoracic spine: Secondary | ICD-10-CM | POA: Insufficient documentation

## 2023-08-29 NOTE — Therapy (Signed)
 OUTPATIENT PHYSICAL THERAPY THORACOLUMBAR TREATMENT   Patient Name: Debbie Pineda MRN: [id number] DOB:May 22, 1960, 63 y.o., female Today's Date: 08/29/2023  END OF SESSION:  PT End of Session - 08/29/23 1042     Visit Number 3    Date for PT Re-Evaluation 09/03/23    Authorization Type [employer]    Authorization Time Period no auth required    PT Start Time 1018    PT Stop Time 1056    PT Time Calculation (min) 38 min    Activity Tolerance Patient tolerated treatment well    Behavior During Therapy WFL for tasks assessed/performed           Past Medical History:  Diagnosis Date   ADD (attention deficit disorder)    Arthritis    all over   Cerebral infarction (HCC) 10/30/2011   Cerebrovascular disease 08/14/2016   Chronic back pain    all over   Chronic low back pain 01/11/2016   Complication of anesthesia    tends to have hypotension when NPO and post-anesthesia   Constipation    takes stool softener daily   Degenerative disk disease    Degenerative joint disease    DVT (deep venous thrombosis) (HCC) 2014   RLE   Family history of adverse reaction to anesthesia    a family member woke up during surgery; think it was my mom   Fibromyalgia    Generalized osteoarthritis of multiple sites 11/04/2013   GERD (gastroesophageal reflux disease)    Headache    Heart palpitations 12/14/2013   resolved   History of blood clots    superficial   Hypoglycemia    Hypothyroid    takes Synthroid  daily   Incomplete emptying of bladder    Insomnia    takes Trazodone  nightly   Iron  deficiency anemia    takes Ferrous Sulfate  daily   Joint pain    Joint swelling    knees and ankles   Memory disorder 08/14/2016   Morbid obesity (HCC)    Nausea    takes Zofran  as needed.Seeing GI doc   Neck pain 05/25/2016   OSA on CPAP    tested more than 5 yrs ago.     Osteoarthritis    Osteopenia    in feet   PFO (patent foramen ovale)    no murmur   PFO  (patent foramen ovale)    Pre-diabetes    Primary osteoarthritis of both feet 05/29/2016   Right bunionectomy August 2017 by Dr. Harden   PVC's (premature ventricular contractions) 10/11/2021   Monitor 9/23: 1.8% PVCs; rare PACs; o/w NSR   Scoliosis    Skin abnormality 02/04/2020   raised area on lip   Stroke [facility]) severallast 2014   right foot weakness; memory issues, black spot right visual field since (03/23/2015)   Thrombophlebitis    Trochanteric bursitis of both hips 05/25/2016   Unilateral primary osteoarthritis, right knee 05/29/2016   Urinary urgency 04/19/2016   Vein disorder 11/29/2010   varicose veins both legs   Wears glasses    Past Surgical History:  Procedure Laterality Date   BIOPSY  11/14/2018   Procedure: BIOPSY;  Surgeon: Golda Claudis PENNER, MD;  [facility];  Service: Endoscopy;;  esophagusgastric   BIOPSY  06/07/2022   Procedure: BIOPSY;  Surgeon: Eartha Angelia Sieving, MD;  [facility]: [facility];  Service: Gastroenterology;;   BONE EXCISION Right 08/29/2017   Procedure: right trapezium excision;  Surgeon: Murrell Kuba, MD;  Location: [facility];  Service: Orthopedics;  Laterality: Right;   BUNIONECTOMY Right 08/2015   CARDIAC CATHETERIZATION     2008.  it was fine (not sure why she had it done, and doesn't know where)   CARPOMETACARPEL SUSPENSION PLASTY Right 08/29/2017   Procedure: SUSPENSION PLASTY RIGHT THUMB;  Surgeon: Murrell Kuba, MD;  Location: [facility];  Service: Orthopedics;  Laterality: Right;   COLONOSCOPY N/A 03/25/2013   Procedure: COLONOSCOPY;  Surgeon: Claudis RAYMOND Rivet, MD;  [facility]: [facility];  Service: Endoscopy;  Laterality: N/A;  930   ERCP  06/02/2020   ESOPHAGOGASTRODUODENOSCOPY     ESOPHAGOGASTRODUODENOSCOPY (EGD) WITH PROPOFOL  N/A 11/14/2018   Procedure: ESOPHAGOGASTRODUODENOSCOPY (EGD) WITH PROPOFOL ;  Surgeon: Rivet Claudis RAYMOND, MD;  [facility]: [facility];  Service: Endoscopy;   Laterality: N/A;  1:55pm-office moved to 11:00am/pt notified to arrive at 9:30am per KF   ESOPHAGOGASTRODUODENOSCOPY (EGD) WITH PROPOFOL  N/A 06/07/2022   Procedure: ESOPHAGOGASTRODUODENOSCOPY (EGD) WITH PROPOFOL ;  Surgeon: Eartha Angelia Sieving, MD;  [facility]: [facility];  Service: Gastroenterology;  Laterality: N/A;  2:00pm;asa 3   EXPLORATORY LAPAROTOMY     took fallopian tubes out   HALLUX FUSION Right 02/12/2020   Procedure: HALLUX INTERPHANGEAL JOINT  FUSION;  Surgeon: Tobie Franky SQUIBB, DPM;  Location: [facility];  Service: Podiatry;  Laterality: Right;   HAMMER TOE SURGERY Right 02/12/2020   Procedure: SECOND AND THIRD HAMMER TOE CORRECTION; CAPSULOTOMY SECOND INTERPHALANGEAL JOINT;  Surgeon: Tobie Franky SQUIBB, DPM;  Location: [facility];  Service: Podiatry;  Laterality: Right;   JOINT REPLACEMENT     bil knee    KNEE ARTHROSCOPY Left    KNEE ARTHROSCOPY W/ ACL RECONSTRUCTION Right yrs ago   added pins   LAPAROSCOPIC CHOLECYSTECTOMY  ~ 2001   ROUX-EN-Y GASTRIC BYPASS  11/20/2010   [place]   SPINAL CORD STIMULATOR INSERTION N/A 04/18/2017   Procedure: LUMBAR SPINAL CORD STIMULATOR INSERTION;  Surgeon: Mindi Mt, MD;  Location[facility];  Service: Neurosurgery;  Laterality: N/A;  LUMBAR SPINAL CORD STIMULATOR INSERTION   stomach stent  04/28/2020   TENDON TRANSFER Right 08/29/2017   Procedure: right abductor pollicis longus transfer;  Surgeon: Murrell Kuba, MD;  Location: [facility];  Service: Orthopedics;  Laterality: Right;   TOTAL KNEE ARTHROPLASTY Left 03/23/2015   Procedure: TOTAL KNEE ARTHROPLASTY;  Surgeon: Jerona Harden GAILS, MD;  Location: [facility];  Service: Orthopedics;  Laterality: Left;   TOTAL KNEE ARTHROPLASTY Right 08/15/2016   Procedure: RIGHT TOTAL KNEE ARTHROPLASTY, REMOVAL ACL SCREWS;  Surgeon: Harden Jerona GAILS, MD;  Location: [facility];  Service: Orthopedics;  Laterality: Right;   TOTAL KNEE ARTHROPLASTY WITH HARDWARE REMOVAL  Right    UPPER GI ENDOSCOPY  12/06/2022   VAGINAL HYSTERECTOMY     tah/bso   VARICOSE VEIN SURGERY Right X 2   WEIL OSTEOTOMY Right 02/12/2020   Procedure: DOUBLE L OSTEOTOMY;  Surgeon: Tobie Franky SQUIBB, DPM;  Location: [facility];  Service: Podiatry;  Laterality: Right;   Patient Active Problem List   Diagnosis Date Noted   Nausea without vomiting 07/02/2023   Encounter for screening colonoscopy 07/02/2023   Belching 03/12/2023   History of seizures 07/10/2022   Melena 05/15/2022   PVC's (premature ventricular contractions) 10/11/2021   Factor V Leiden mutation (HCC) 10/11/2021   Other secondary scoliosis, lumbar region    Other spondylosis with radiculopathy, lumbar region    Spondylolisthesis, lumbar region    Thoracic  spondylosis with radiculopathy    Radiculopathy, lumbar region    Breakdown (mechanical) of implanted electronic neurostimulator of spinal cord electrode (lead), sequela    Degenerative disc disease, lumbar    At risk for adverse drug event 08/31/2021   Acute on chronic blood loss anemia 08/28/2021   Drug-induced constipation 08/28/2021   GERD without esophagitis 08/28/2021   Overactive bladder 08/28/2021   Chronic migraine without aura 08/28/2021   Hx of blood clots 08/28/2021   Major neurocognitive disorder (HCC) 08/28/2021   Status post lumbar spinal fusion 08/21/2021   Weight gain 07/13/2021   Encounter for removal of internal fixation device 10/21/2020   LFTs abnormal 03/02/2020   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   Gastroesophageal reflux disease 09/22/2019   SSBE (short-segment Barrett's esophagus) 09/22/2019   Flatulence 09/22/2019   Migraine 11/11/2018   Abdominal pain, epigastric 10/14/2018   LUQ pain 10/14/2018   Attention deficit hyperactivity disorder (ADHD) 01/12/2018   Insomnia 01/12/2018   Elevated liver enzymes 09/10/2017   History of diabetes mellitus 06/06/2017   Primary  osteoarthritis of right hand 06/06/2017   Transaminasemia    TIA (transient ischemic attack) 05/01/2017   Dysphasia 05/01/2017   Chronic pain syndrome 05/01/2017   Confusion    Presence of right artificial knee joint 09/17/2016   H/O total knee replacement, right 08/15/2016   Presence of retained hardware    Memory disorder 08/14/2016   Cerebrovascular disease 08/14/2016   Unilateral primary osteoarthritis, right knee 05/29/2016   Primary osteoarthritis of both feet 05/29/2016   Trochanteric bursitis of both hips 05/25/2016   Neck pain 05/25/2016   Urinary urgency 04/19/2016   Chronic low back pain 01/11/2016   Total knee replacement status 03/23/2015   Heart palpitations 12/14/2013   Generalized osteoarthritis of multiple sites 11/04/2013   Elevated LFTs 03/17/2012   Cerebral infarction, unspecified (HCC) 11/29/2011   Cerebral infarction (HCC) 10/30/2011   PFO (patent foramen ovale) 10/30/2011   Acquired hypothyroidism    Thrombophlebitis    OSA on CPAP    Fibromyalgia    Status post bariatric surgery 12/07/2010   Vein disorder 11/29/2010   S/P total hysterectomy and bilateral salpingo-oophorectomy 11/29/2010   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  ONSET DATE: October 2023, back surgery   SUBJECTIVE:  SUBJECTIVE STATEMENT: 08/22/23:  Reports she is having a bad fibro day.  Most pain in mid back at bra strap.  4-5/10 increases with ambulation.   Eval:  Pt  reports mid to low back pain. Pt states she had back surgery and states the pain has gotten worse over the past few months. Pt states when she is up moving around she tends to slouched forward. Pt states she needs strengthening in her back muscles to avoid bending forward. Pt states she has been using SPC for years even before back surgery  PERTINENT HISTORY:  -Pt has had several strokes last one in 2014, right foot drags, memory issues -Diabetes, controlled with diet -Neuropathy -Arthritis -Fibromyalgia   PAIN:  Are you having pain? Yes: NPRS scale: 4/10 medicated now, worst in last week 8/10 Pain location: low and mid back pain Pain description: ache, dull and sharp on occasion Aggravating factors: standing up cooking, walking, sitting too long, Relieving factors: bending forward, extension with self pressure to low back, massage, heat, medications, lays on left side  PRECAUTIONS: None  RED FLAGS: Bowel or bladder incontinence: Yes: years   WEIGHT BEARING RESTRICTIONS: No  FALLS:  Has patient fallen in last 6 months? Yes. Number of falls 1, just loss of balance  OCCUPATION: disability  PLOF: Independent with household mobility with device, Requires assistive device for independence, and Needs assistance with homemaking, pt lives with mother, and husband  PATIENT GOALS: strengthen low/mid back, increase activity tolerance, improve walking and balance  NEXT MD VISIT: after therapy  OBJECTIVE:  Note: Objective measures were completed at Evaluation unless otherwise noted.  DIAGNOSTIC FINDINGS:  XRs of the thoracic spine from 06/24/2023 were independently reviewed and  interpreted, showing posterior instrumentation to T10. Several of the  screws appear to breach the superior endplate of their respective  vertebra. The T10 screws have lucency around them. There is kyphosis above  her fusion that measures 14 degrees. Her films immediately after surgery  measure about 12 degrees  of kyphosis ([place]). No fracture or dislocation  seen.  PATIENT SURVEYS:  08/22/23:  Modified Oswestry Low Back Pain Disability Questionnaire: 25 / 50 = 50.0  COGNITION: Overall cognitive status: memory issues     SENSATION: Light touch: Impaired , just feet, sensitivity to cold  POSTURE: rounded shoulders, forward head, decreased lumbar lordosis, increased thoracic kyphosis, and flexed trunk   PALPATION: Pt demonstrates tenderness to palpation of spinous processes of T5-T6 and L4-L5. Pt also demonstrates increased tone on right side of thoracic and lumbar spine.  THORACOLUMBAR ROM:   AROM eval  Flexion   Extension   Right lateral flexion 30 degrees, Pain, worse  Left lateral flexion 35 degrees, pain  Right rotation   Left rotation    (Blank rows = not tested)  LOWER EXTREMITY ROM:     Active  Right eval Left eval  Hip flexion    Hip extension    Hip abduction    Hip adduction    Hip internal rotation    Hip external rotation    Knee flexion    Knee extension    Ankle dorsiflexion    Ankle plantarflexion    Ankle inversion    Ankle eversion     (Blank rows = not tested)  LOWER EXTREMITY MMT:    MMT Right eval Left eval  Hip flexion 4 3  Hip extension 4 4  Hip abduction 4 4  Hip adduction 4 4  Hip internal rotation  Hip external rotation    Knee flexion 4 4  Knee extension 4 4  Ankle dorsiflexion 4 4  Ankle plantarflexion    Ankle inversion    Ankle eversion     (Blank rows = not tested)    FUNCTIONAL TESTS:  5 times sit to stand: 9.01 seconds 2 minute walk test: 351 feet  GAIT: Distance walked: 400 feet Assistive device utilized: Single point cane and None Level of assistance: Modified independence Comments: increased flexion of trunk as test progressed  TREATMENT DATE:  08/29/23 Seated: Scapular retraction 10X5  Sit to stand 10X  Long arc quad 10X5  Supine: decompression 2-5, 5X5 holds  Bridge 2X10  LTR 10X5  holds  SLR Sidleying hip abudction   Hip adduction Prone hip extension  08/22/23: Reviewed goals Educated importance of HEP compliance Modified Oswestry Low Back Pain Disability Questionnaire: 25 / 50 = 50.0 Supine:  Bridge  LTR 5x 10  Decompression 2-5 5x 5 Seated:  Scapular retraction.  Sit to stand 5x  07/23/2023  Evaluation: -ROM measured, Strength assessed, HEP prescribed, pt educated on prognosis, findings, and importance of HEP compliance if given.   PATIENT EDUCATION:  Education details: Pt was educated on findings of PT evaluation, prognosis, frequency of therapy visits and rationale, attendance policy, and HEP if given.   Person educated: Patient Education method: Explanation, Verbal cues, and Handouts Education comprehension: verbalized understanding, verbal cues required, and needs further education  HOME EXERCISE PROGRAM: Access Code: [email or web address] URL: [email or web address] Date: 07/23/2023 Prepared by: Lang Ada  Exercises - Supine Bridge  - 1 x daily - 7 x weekly - 3 sets - 10 reps - Supine Lower Trunk Rotation  - 1 x daily - 7 x weekly - 3 sets - 10 reps - Seated Scapular Retraction  - 1 x daily - 7 x weekly - 3 sets - 10 reps  08/22/23: Decompression  Access Code: [email or web address] URL: [email or web address] Date: 08/29/2023 - Seated Long Arc Quad  - 1 x daily - 7 x weekly - 3 sets - 10 reps - 5 sec hold - Active Straight Leg Raise with Quad Set  - 1 x daily - 7 x weekly - 3 sets - 10 reps - Sidelying Hip Abduction  - 1 x daily - 7 x weekly - 3 sets - 10 reps - Sidelying Hip Adduction  - 1 x daily - 7 x weekly - 3 sets - 10 reps - Prone Heel Squeeze  - 1 x daily - 7 x weekly - 3 sets - 10 reps - 5 sec hold - Prone Hip Extension  - 1 x daily - 7 x weekly - 3 sets - 10 reps  ASSESSMENT:  CLINICAL IMPRESSION: 08/29/23:  continued with focus on improving core and LE strengthening.  Pt with good power completing sit to stands, noted  reduction in speed by repetition 7.  Reviewed decompression exercises with minimal cues required.  Began seated LAQ and supine SLR to address quad weakness.  Global leg strengthening also added in supine and prone for hip weakness.  These were added to HEP as well and given updated copy.  No reports of increased pain through session.  Eval:  Patient is a 64 y.o. female who was seen today for physical therapy evaluation and treatment for M54.9 (ICD-10-CM) - Mid back pain.   Patient demonstrates decreased core/LE strength, abnormal pain rating in thoracic and lumbar spine, abnormal gait, and impaired balance. Patient also demonstrates difficulty with ambulation during today's session  with decreased stride length and velocity noted. Patient also demonstrates ability to complete the without an AD although gait deviations became more apparent at test progressed. Patient requires education on role of PT, prognosis, HEP and compliance. Patient would benefit from skilled physical therapy for increased endurance with ambulation, increased core/LE strength, and balance for improved gait quality, return to higher level of function with ADLs, and progress towards therapy goals.   OBJECTIVE IMPAIRMENTS: Abnormal gait, decreased activity tolerance, decreased balance, decreased endurance, decreased mobility, difficulty walking, decreased ROM, decreased strength, postural dysfunction, and pain.   ACTIVITY LIMITATIONS: carrying, lifting, squatting, transfers, bed mobility, continence, and locomotion level  PARTICIPATION LIMITATIONS: meal prep, cleaning, laundry, shopping, community activity, and yard work  PERSONAL FACTORS: Fitness, Past/current experiences, Time since onset of injury/illness/exacerbation, and 1-2 comorbidities: hx of back surgery, diabetes are also affecting patient's functional outcome.   REHAB POTENTIAL: Fair chronic in nature  CLINICAL DECISION MAKING: Evolving/moderate  complexity  EVALUATION COMPLEXITY: Moderate   GOALS: Goals reviewed with patient? Yes  SHORT TERM GOALS: Target date: 07/22/225  Pt will be independent with HEP in order to demonstrate participation in Physical Therapy POC.  Baseline: Goal status: IN PROGRESS  2.  Pt will report 3/10 pain at worst with mobility in order to demonstrate improved pain with ADLs.  Baseline:  Goal status: IN PROGRESS  LONG TERM GOALS: Target date: 09/03/23  Pt will improve 5TSTS by at least 2.3 seconds in order to demonstrate improved functional strength to return to desired activities.  Baseline: see objective.  Goal status: IN PROGRESS  2.  Pt will improve 2 MWT by 50 feet without AD in order to demonstrate improved functional ambulatory capacity in community setting.  Baseline: see objective.  Goal status: IN PROGRESS  3.  Pt will improve Modified Oswestry score by at least 6 points in order to demonstrate improved pain with functional goals and outcomes. Baseline: see objective.  Goal status: IN PROGRESS  4.  Pt will report 1/10 pain at worst with mobility in order to demonstrate reduced pain with ADLs lasting greater than 30 minutes.  Baseline: see objective.  Goal status: IN PROGRESS   PLAN:  PT FREQUENCY: 1-2x/week  PT DURATION: 6 weeks  PLANNED INTERVENTIONS: 97110-Therapeutic exercises, 97530- Therapeutic activity, [id number]- Neuromuscular re-education, 97535- Self Care, [id number]- Manual therapy, [phone]- Gait training, Patient/Family education, Balance training, Stair training, Spinal mobilization, DME instructions, Cryotherapy, and Moist heat.  PLAN FOR NEXT SESSION: Progress lumbar and thoracic spine ROM and strengthening of paraspinals, progress gait/balance training.  Add postural strengthening with band next session.   Greig KATHEE Fuse, PTA/CLT [facility] Ph: [phone]  10:43 AM, 08/29/23

## 2023-08-30 ENCOUNTER — Telehealth: Payer: Self-pay

## 2023-08-30 NOTE — Telephone Encounter (Signed)
   Pre-operative Risk Assessment    Patient Name: Debbie Pineda  DOB: [birth year] MRN: [id number]   Date of last office visit: 01/17/22 MAUDE EMMER, MD Date of next office visit: 10/25/23 MAUDE EMMER, MD   Request for Surgical Clearance    Procedure:  REVISIM OF GASTROJEJUNAL ANASTOMOSIS  Date of Surgery:  Clearance 09/25/23                                Surgeon:  NOT INDICATED Surgeon's Group or Practice Name:  [facility] OF MEDICINE DIVISION OF GASTROINTESTINAL & LAPAROSCOPIC SURGERY Phone number:  [phone] Fax number:  [phone]   Type of Clearance Requested:   - Medical  - Pharmacy:  Hold Rivaroxaban  (Xarelto )     Type of Anesthesia:  General    Additional requests/questions:    SignedLucie DELENA Ku   08/30/2023, 1:22 PM

## 2023-09-03 ENCOUNTER — Encounter (HOSPITAL_COMMUNITY): Payer: Self-pay

## 2023-09-03 ENCOUNTER — Encounter (HOSPITAL_COMMUNITY)

## 2023-09-03 DIAGNOSIS — K316 Fistula of stomach and duodenum: Secondary | ICD-10-CM | POA: Diagnosis not present

## 2023-09-03 NOTE — Telephone Encounter (Signed)
    Pt is calling back to follow up  

## 2023-09-05 ENCOUNTER — Ambulatory Visit (HOSPITAL_COMMUNITY)

## 2023-09-05 DIAGNOSIS — M5459 Other low back pain: Secondary | ICD-10-CM

## 2023-09-05 DIAGNOSIS — M546 Pain in thoracic spine: Secondary | ICD-10-CM

## 2023-09-05 DIAGNOSIS — Z7409 Other reduced mobility: Secondary | ICD-10-CM

## 2023-09-05 NOTE — Therapy (Addendum)
 OUTPATIENT PHYSICAL THERAPY THORACOLUMBAR TREATMENT   Patient Name: Debbie Pineda MRN: [id number] DOB:1960/07/12, 63 y.o., female Today's Date: 09/05/2023  END OF SESSION:  PT End of Session - 09/05/23 1439     Visit Number 4    Number of Visits 12    Date for PT Re-Evaluation 09/03/23    Authorization Type [employer]    Authorization Time Period no auth required    PT Start Time 1440   late sign in   PT Stop Time 1515    PT Time Calculation (min) 35 min    Activity Tolerance Patient tolerated treatment well    Behavior During Therapy WFL for tasks assessed/performed           Past Medical History:  Diagnosis Date   ADD (attention deficit disorder)    Arthritis    all over   Cerebral infarction (HCC) 10/30/2011   Cerebrovascular disease 08/14/2016   Chronic back pain    all over   Chronic low back pain 01/11/2016   Complication of anesthesia    tends to have hypotension when NPO and post-anesthesia   Constipation    takes stool softener daily   Degenerative disk disease    Degenerative joint disease    DVT (deep venous thrombosis) (HCC) 2014   RLE   Family history of adverse reaction to anesthesia    a family member woke up during surgery; think it was my mom   Fibromyalgia    Generalized osteoarthritis of multiple sites 11/04/2013   GERD (gastroesophageal reflux disease)    Headache    Heart palpitations 12/14/2013   resolved   History of blood clots    superficial   Hypoglycemia    Hypothyroid    takes Synthroid  daily   Incomplete emptying of bladder    Insomnia    takes Trazodone  nightly   Iron  deficiency anemia    takes Ferrous Sulfate  daily   Joint pain    Joint swelling    knees and ankles   Memory disorder 08/14/2016   Morbid obesity (HCC)    Nausea    takes Zofran  as needed.Seeing GI doc   Neck pain 05/25/2016   OSA on CPAP    tested more than 5 yrs ago.     Osteoarthritis    Osteopenia    in feet   PFO (patent  foramen ovale)    no murmur   PFO (patent foramen ovale)    Pre-diabetes    Primary osteoarthritis of both feet 05/29/2016   Right bunionectomy August 2017 by Dr. Harden   PVC's (premature ventricular contractions) 10/11/2021   Monitor 9/23: 1.8% PVCs; rare PACs; o/w NSR   Scoliosis    Skin abnormality 02/04/2020   raised area on lip   Stroke [facility]) severallast 2014   right foot weakness; memory issues, black spot right visual field since (03/23/2015)   Thrombophlebitis    Trochanteric bursitis of both hips 05/25/2016   Unilateral primary osteoarthritis, right knee 05/29/2016   Urinary urgency 04/19/2016   Vein disorder 11/29/2010   varicose veins both legs   Wears glasses    Past Surgical History:  Procedure Laterality Date   BIOPSY  11/14/2018   Procedure: BIOPSY;  Surgeon: Golda Claudis PENNER, MD;  [facility];  Service: Endoscopy;;  esophagusgastric   BIOPSY  06/07/2022   Procedure: BIOPSY;  Surgeon: Eartha Angelia Sieving, MD;  [facility]: [facility];  Service: Gastroenterology;;   BONE EXCISION Right 08/29/2017  Procedure: right trapezium excision;  Surgeon: Murrell Kuba, MD;  Location: [facility];  Service: Orthopedics;  Laterality: Right;   BUNIONECTOMY Right 08/2015   CARDIAC CATHETERIZATION     2008.  it was fine (not sure why she had it done, and doesn't know where)   CARPOMETACARPEL SUSPENSION PLASTY Right 08/29/2017   Procedure: SUSPENSION PLASTY RIGHT THUMB;  Surgeon: Murrell Kuba, MD;  Location: [facility];  Service: Orthopedics;  Laterality: Right;   COLONOSCOPY N/A 03/25/2013   Procedure: COLONOSCOPY;  Surgeon: Claudis RAYMOND Rivet, MD;  [facility]: [facility];  Service: Endoscopy;  Laterality: N/A;  930   ERCP  06/02/2020   ESOPHAGOGASTRODUODENOSCOPY     ESOPHAGOGASTRODUODENOSCOPY (EGD) WITH PROPOFOL  N/A 11/14/2018   Procedure: ESOPHAGOGASTRODUODENOSCOPY (EGD) WITH PROPOFOL ;  Surgeon: Rivet Claudis RAYMOND, MD;  [facility]: [facility];  Service: Endoscopy;  Laterality: N/A;  1:55pm-office moved to 11:00am/pt notified to arrive at 9:30am per KF   ESOPHAGOGASTRODUODENOSCOPY (EGD) WITH PROPOFOL  N/A 06/07/2022   Procedure: ESOPHAGOGASTRODUODENOSCOPY (EGD) WITH PROPOFOL ;  Surgeon: Eartha Angelia Sieving, MD;  [facility]: [facility];  Service: Gastroenterology;  Laterality: N/A;  2:00pm;asa 3   EXPLORATORY LAPAROTOMY     took fallopian tubes out   HALLUX FUSION Right 02/12/2020   Procedure: HALLUX INTERPHANGEAL JOINT  FUSION;  Surgeon: Tobie Franky SQUIBB, DPM;  Location: [facility];  Service: Podiatry;  Laterality: Right;   HAMMER TOE SURGERY Right 02/12/2020   Procedure: SECOND AND THIRD HAMMER TOE CORRECTION; CAPSULOTOMY SECOND INTERPHALANGEAL JOINT;  Surgeon: Tobie Franky SQUIBB, DPM;  Location: [facility];  Service: Podiatry;  Laterality: Right;   JOINT REPLACEMENT     bil knee    KNEE ARTHROSCOPY Left    KNEE ARTHROSCOPY W/ ACL RECONSTRUCTION Right yrs ago   added pins   LAPAROSCOPIC CHOLECYSTECTOMY  ~ 2001   ROUX-EN-Y GASTRIC BYPASS  11/20/2010   [place]   SPINAL CORD STIMULATOR INSERTION N/A 04/18/2017   Procedure: LUMBAR SPINAL CORD STIMULATOR INSERTION;  Surgeon: Mindi Mt, MD;  Location: [facility];  Service: Neurosurgery;  Laterality: N/A;  LUMBAR SPINAL CORD STIMULATOR INSERTION   stomach stent  04/28/2020   TENDON TRANSFER Right 08/29/2017   Procedure: right abductor pollicis longus transfer;  Surgeon: Murrell Kuba, MD;  Location: [facility];  Service: Orthopedics;  Laterality: Right;   TOTAL KNEE ARTHROPLASTY Left 03/23/2015   Procedure: TOTAL KNEE ARTHROPLASTY;  Surgeon: Jerona Harden GAILS, MD;  Location: [facility];  Service: Orthopedics;  Laterality: Left;   TOTAL KNEE ARTHROPLASTY Right 08/15/2016   Procedure: RIGHT TOTAL KNEE ARTHROPLASTY, REMOVAL ACL SCREWS;  Surgeon: Harden Jerona GAILS, MD;  Location: [facility];  Service: Orthopedics;  Laterality: Right;   TOTAL KNEE  ARTHROPLASTY WITH HARDWARE REMOVAL Right    UPPER GI ENDOSCOPY  12/06/2022   VAGINAL HYSTERECTOMY     tah/bso   VARICOSE VEIN SURGERY Right X 2   WEIL OSTEOTOMY Right 02/12/2020   Procedure: DOUBLE L OSTEOTOMY;  Surgeon: Tobie Franky SQUIBB, DPM;  Location: [facility];  Service: Podiatry;  Laterality: Right;   Patient Active Problem List   Diagnosis Date Noted   Nausea without vomiting 07/02/2023   Encounter for screening colonoscopy 07/02/2023   Belching 03/12/2023   History of seizures 07/10/2022   Melena 05/15/2022   PVC's (premature ventricular contractions) 10/11/2021   Factor V Leiden mutation (HCC) 10/11/2021   Other secondary scoliosis, lumbar region    Other spondylosis with radiculopathy, lumbar region  Spondylolisthesis, lumbar region    Thoracic spondylosis with radiculopathy    Radiculopathy, lumbar region    Breakdown (mechanical) of implanted electronic neurostimulator of spinal cord electrode (lead), sequela    Degenerative disc disease, lumbar    At risk for adverse drug event 08/31/2021   Acute on chronic blood loss anemia 08/28/2021   Drug-induced constipation 08/28/2021   GERD without esophagitis 08/28/2021   Overactive bladder 08/28/2021   Chronic migraine without aura 08/28/2021   Hx of blood clots 08/28/2021   Major neurocognitive disorder (HCC) 08/28/2021   Status post lumbar spinal fusion 08/21/2021   Weight gain 07/13/2021   Encounter for removal of internal fixation device 10/21/2020   LFTs abnormal 03/02/2020   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   Gastroesophageal reflux disease 09/22/2019   SSBE (short-segment Barrett's esophagus) 09/22/2019   Flatulence 09/22/2019   Migraine 11/11/2018   Abdominal pain, epigastric 10/14/2018   LUQ pain 10/14/2018   Attention deficit hyperactivity disorder (ADHD) 01/12/2018   Insomnia 01/12/2018   Elevated liver enzymes 09/10/2017   History of diabetes mellitus  06/06/2017   Primary osteoarthritis of right hand 06/06/2017   Transaminasemia    TIA (transient ischemic attack) 05/01/2017   Dysphasia 05/01/2017   Chronic pain syndrome 05/01/2017   Confusion    Presence of right artificial knee joint 09/17/2016   H/O total knee replacement, right 08/15/2016   Presence of retained hardware    Memory disorder 08/14/2016   Cerebrovascular disease 08/14/2016   Unilateral primary osteoarthritis, right knee 05/29/2016   Primary osteoarthritis of both feet 05/29/2016   Trochanteric bursitis of both hips 05/25/2016   Neck pain 05/25/2016   Urinary urgency 04/19/2016   Chronic low back pain 01/11/2016   Total knee replacement status 03/23/2015   Heart palpitations 12/14/2013   Generalized osteoarthritis of multiple sites 11/04/2013   Elevated LFTs 03/17/2012   Cerebral infarction, unspecified (HCC) 11/29/2011   Cerebral infarction (HCC) 10/30/2011   PFO (patent foramen ovale) 10/30/2011   Acquired hypothyroidism    Thrombophlebitis    OSA on CPAP    Fibromyalgia    Status post bariatric surgery 12/07/2010   Vein disorder 11/29/2010   S/P total hysterectomy and bilateral salpingo-oophorectomy 11/29/2010   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  Feels she has improved by 25% since beginning at PT.  Reports compliance with HEP 2-3x/ week.  Pt walks occasional with SPC, stated it helps with her ability to stand up.  Eval:  Pt reports mid to low back pain. Pt states she had back surgery and states the pain has gotten worse over the past few months. Pt states when she is up moving around she tends to slouched forward. Pt states she needs strengthening in her back muscles to avoid bending forward. Pt states she has been using SPC for years even before back surgery  PERTINENT HISTORY:  -Pt has had several strokes last one in 2014, right foot drags, memory issues -Diabetes, controlled with diet -Neuropathy -Arthritis -Fibromyalgia   PAIN:  Are you having pain? Yes: NPRS scale: 4/10 medicated now, worst in last week 8/10 Pain location: low and mid back pain Pain description: ache, dull and sharp on occasion Aggravating factors: standing up cooking, walking, sitting too long, Relieving factors: bending forward, extension with self pressure to low back, massage, heat, medications, lays on left side  PRECAUTIONS: None  RED FLAGS: Bowel or bladder incontinence: Yes: years   WEIGHT BEARING RESTRICTIONS: No  FALLS:  Has patient fallen in last 6 months? Yes. Number of falls 1, just loss of balance  OCCUPATION: disability  PLOF: Independent with household mobility with device, Requires assistive device for independence, and Needs assistance with homemaking, pt lives with mother, and husband  PATIENT GOALS: strengthen low/mid back, increase activity tolerance, improve walking and balance  NEXT MD VISIT: after therapy  OBJECTIVE:  Note: Objective measures were completed at Evaluation unless otherwise noted.  DIAGNOSTIC FINDINGS:  XRs of the thoracic spine from 06/24/2023 were independently reviewed and  interpreted, showing posterior instrumentation to T10. Several of the   screws appear to breach the superior endplate of their respective  vertebra. The T10 screws have lucency around them. There is kyphosis above  her fusion that measures 14 degrees. Her films immediately after surgery  measure about 12 degrees of kyphosis (Glattes). No fracture or dislocation  seen.  PATIENT SURVEYS:  08/22/23:  Modified Oswestry Low Back Pain Disability Questionnaire: 25 / 50 = 50.0  09/05/23:  Modified Oswestry Low Back Pain Disability Questionnaire: 23 / 50 = 46.0 %  COGNITION: Overall cognitive status: memory issues     SENSATION: Light touch: Impaired , just feet, sensitivity to cold  POSTURE: rounded shoulders, forward head, decreased lumbar lordosis, increased thoracic kyphosis, and flexed trunk   PALPATION: Pt demonstrates tenderness to palpation of spinous processes of T5-T6 and L4-L5. Pt also demonstrates increased tone on right side of thoracic and lumbar spine.  THORACOLUMBAR ROM:   AROM eval  Flexion   Extension   Right lateral flexion 30 degrees, Pain, worse  Left lateral flexion 35 degrees, pain  Right rotation   Left rotation    (Blank rows = not tested)  LOWER EXTREMITY ROM:     Active  Right eval Left eval  Hip flexion    Hip extension    Hip abduction    Hip adduction    Hip internal rotation    Hip external rotation    Knee flexion    Knee extension    Ankle dorsiflexion    Ankle plantarflexion    Ankle inversion    Ankle eversion     (Blank rows = not tested)  LOWER EXTREMITY MMT:    MMT Right eval Left eval Right 09/05/23 Left 09/05/23  Hip flexion 4 3 4+ 4  Hip extension 4 4 4 4   Hip abduction 4 4 4 4   Hip adduction 4 4    Hip internal rotation      Hip external rotation      Knee flexion 4 4 4+ 4+  Knee extension 4 4 4+ 4+  Ankle dorsiflexion 4 4 4+ 4+  Ankle plantarflexion      Ankle inversion      Ankle eversion       (Blank rows = not tested)    FUNCTIONAL TESTS:  Eval: 5 times sit to stand: 9.01  seconds 2 minute walk test: 351 feet 09/05/23: 444ft with SPC 5STS 6.88  GAIT: Distance walked: 400 feet Assistive device utilized: Single point cane and None Level of assistance: Modified independence Comments: increased flexion of trunk as test progressed  TREATMENT DATE:  09/05/23:   48ft with SPC Modified Oswestry Low Back Pain Disability Questionnaire: 23 / 50 = 46.0% MMT see above 5STS 6.88  Standing:  RTB Rows 10x  RTB shoulder extension 10x   08/29/23 Seated: Scapular retraction 10X5  Sit to stand 10X  Long arc quad 10X5  Supine: decompression 2-5, 5X5 holds  Bridge 2X10  LTR 10X5 holds  SLR Sidleying hip abudction   Hip adduction Prone hip extension  08/22/23: Reviewed goals Educated importance of HEP compliance Modified Oswestry Low Back Pain Disability Questionnaire: 25 / 50 = 50.0 Supine:  Bridge  LTR 5x 10  Decompression 2-5 5x 5 Seated:  Scapular retraction.  Sit to stand 5x  07/23/2023  Evaluation: -ROM measured, Strength assessed, HEP prescribed, pt educated on prognosis, findings, and importance of HEP compliance if given.   PATIENT EDUCATION:  Education details: Pt was educated on findings of PT evaluation, prognosis, frequency of therapy visits and rationale, attendance policy, and HEP if given.   Person educated: Patient Education method: Explanation, Verbal cues, and Handouts Education comprehension: verbalized understanding, verbal cues required, and needs further education  HOME EXERCISE PROGRAM: Access Code: [email or web address] URL: [email or web address] Date: 07/23/2023 Prepared by: Lang Ada  Exercises - Supine Bridge  - 1 x daily - 7 x weekly - 3 sets - 10 reps - Supine Lower Trunk Rotation  - 1 x daily - 7 x weekly - 3 sets - 10 reps - Seated Scapular Retraction  - 1 x daily - 7 x weekly - 3 sets - 10 reps  08/22/23: Decompression  Access Code: [email or web address] URL: [email or web address] Date:  08/29/2023 - Seated Long Arc Quad  - 1 x daily - 7 x weekly - 3 sets - 10 reps - 5 sec hold - Active Straight Leg Raise with Quad Set  - 1 x daily - 7 x weekly - 3 sets - 10 reps - Sidelying Hip Abduction  - 1 x daily - 7 x weekly - 3 sets - 10 reps - Sidelying Hip Adduction  - 1 x daily - 7 x weekly - 3 sets - 10 reps - Prone Heel Squeeze  - 1 x daily - 7 x weekly - 3 sets - 10 reps - 5 sec hold - Prone Hip Extension  - 1 x daily - 7 x weekly - 3 sets - 10 reps  ASSESSMENT:  CLINICAL IMPRESSION: 09/05/23:  Reviewed goals with the following findings:  Pt reports compliance with HEP 2-3x/week and feels she has improved by 25%.  Objective testing findings include: increased cadence with and faster timing with 5STS.  Pt continues to be limited by pain, weakness and posture.  Pt educated importance of posture and began postural strengthening exercises.  Pt used SPC during , stated it helps to keep her erect.  Pt will continue to benefit from skilled intervention to address goals unmet.  Eval:  Patient is a 63 y.o. female who was seen today for physical therapy evaluation and treatment for M54.9 (ICD-10-CM) - Mid back pain.   Patient demonstrates decreased core/LE strength, abnormal pain rating in thoracic and lumbar spine, abnormal gait, and impaired balance. Patient also demonstrates difficulty with ambulation during today's session with decreased stride length and velocity noted. Patient also demonstrates ability to complete the without an AD although gait deviations became more apparent at test progressed. Patient requires education on role of PT, prognosis, HEP and compliance. Patient would benefit from skilled physical therapy for increased endurance with ambulation, increased core/LE strength, and balance for improved gait quality, return to higher level of function with ADLs, and progress towards therapy goals.   OBJECTIVE IMPAIRMENTS: Abnormal gait, decreased activity tolerance,  decreased balance, decreased endurance, decreased mobility, difficulty walking, decreased ROM, decreased strength, postural dysfunction, and pain.   ACTIVITY LIMITATIONS: carrying, lifting, squatting, transfers, bed mobility, continence, and locomotion level  PARTICIPATION LIMITATIONS: meal prep, cleaning, laundry, shopping, community activity, and yard work  PERSONAL FACTORS: Fitness, Past/current experiences, Time since onset of injury/illness/exacerbation, and 1-2 comorbidities: hx of back surgery, diabetes are also affecting patient's functional outcome.   REHAB POTENTIAL: Fair chronic in nature  CLINICAL DECISION MAKING: Evolving/moderate complexity  EVALUATION COMPLEXITY: Moderate   GOALS: Goals reviewed with patient? Yes  SHORT TERM GOALS: Target date: 07/22/225  Pt will be independent with HEP in order to demonstrate participation in Physical Therapy POC.  Baseline:  09/05/23:  Reports compliance with HEP 2-3 times a week Goal status: IN PROGRESS  2.  Pt will report 3/10 pain at worst with mobility in order to demonstrate improved pain with ADLs.  Baseline: 09/05/23:  average pain scale 3-4/10 Goal status: IN PROGRESS  LONG TERM GOALS: Target date: 09/03/23  Pt will improve 5TSTS by at least 2.3 seconds in order to demonstrate improved functional strength to return to desired activities.  Baseline: see objective.  Goal status: MET  2.  Pt will improve 2 MWT by 50 feet without AD in order to demonstrate improved functional ambulatory capacity in community setting.  Baseline: see objective. ;09/05/23:  472ft with SPC Goal status: IN PROGRESS  3.  Pt will improve Modified Oswestry score by at least 6 points in order to demonstrate improved pain with functional goals and outcomes. Baseline: see objective. ;  Modified Oswestry Low Back Pain Disability Questionnaire: 23 / 50 = 46.0 % Goal status: IN PROGRESS  4.  Pt will report 1/10 pain at worst with mobility in order  to demonstrate reduced pain with ADLs lasting greater than 30 minutes.  Baseline: see objective.  Goal status: IN PROGRESS   PLAN:  PT FREQUENCY: 1-2x/week  PT DURATION: 4 weeks  PLANNED INTERVENTIONS: 97110-Therapeutic exercises, 97530- Therapeutic activity, [id number]- Neuromuscular re-education, 97535- Self Care, [id number]- Manual therapy, [phone]- Gait training, Patient/Family education, Balance training, Stair training, Spinal mobilization, DME instructions, Cryotherapy, and Moist heat.  PLAN FOR NEXT SESSION: Progress lumbar and thoracic spine ROM and strengthening of paraspinals, progress gait/balance training.  Continue postural strengthening with band next session, give as HEP when good form. SEEKING  4 more weeks at 2 visits per week to address goals unmet.  Augustin Mclean, LPTA/CLT; WILLAIM [phone]   4:27 PM, 09/05/23  Lang Ada, PT, DPT [facility] Office: [phone] 1:29 PM, 09/06/23

## 2023-09-05 NOTE — Telephone Encounter (Signed)
   Name: Debbie Pineda  DOB: October 02, 1960  MRN: [id number]  Primary Cardiologist: Maude Emmer, MD  Chart reviewed as part of pre-operative protocol coverage. Because of [name] past medical history and time since last visit, she will require a follow-up in-office visit in order to better assess preoperative cardiovascular risk.  Patient has not been seen in office since 2023.  Pre-op covering staff: - Please schedule appointment and call patient to inform them. If patient already had an upcoming appointment within acceptable timeframe, please add pre-op clearance to the appointment notes so provider is aware. - Please contact requesting surgeon's office via preferred method (i.e, phone, fax) to inform them of need for appointment prior to surgery.  Pt on Xarelto  for hx of Factor V Leiden deficiency and hx of PFO/stroke. Cardiology has not been managing anticoagulation, clearance recs should come from managing provider.     Damien JAYSON Braver, NP  09/05/2023, 3:59 PM

## 2023-09-05 NOTE — Telephone Encounter (Signed)
 Patient has been scheduled for IN OFFICE visit

## 2023-09-06 NOTE — Addendum Note (Signed)
 Addended by: Jonathon Tan on: 09/06/2023 01:32 PM   Modules accepted: Orders

## 2023-09-10 ENCOUNTER — Ambulatory Visit (HOSPITAL_COMMUNITY)

## 2023-09-10 ENCOUNTER — Encounter (HOSPITAL_COMMUNITY): Payer: Self-pay

## 2023-09-10 DIAGNOSIS — M546 Pain in thoracic spine: Secondary | ICD-10-CM

## 2023-09-10 DIAGNOSIS — Z7409 Other reduced mobility: Secondary | ICD-10-CM

## 2023-09-10 DIAGNOSIS — M5459 Other low back pain: Secondary | ICD-10-CM

## 2023-09-10 NOTE — Therapy (Signed)
 OUTPATIENT PHYSICAL THERAPY THORACOLUMBAR TREATMENT   Patient Name: Debbie Pineda MRN: [id number] DOB:01-12-1961, 63 y.o., female Today's Date: 09/10/2023  END OF SESSION:  PT End of Session - 09/10/23 1435     Visit Number 5    Number of Visits 12    Date for PT Re-Evaluation 09/03/23    Authorization Type [employer]    Authorization Time Period no auth required    PT Start Time 1435    PT Stop Time 1515    PT Time Calculation (min) 40 min    Activity Tolerance Patient tolerated treatment well    Behavior During Therapy WFL for tasks assessed/performed            Past Medical History:  Diagnosis Date   ADD (attention deficit disorder)    Arthritis    all over   Cerebral infarction (HCC) 10/30/2011   Cerebrovascular disease 08/14/2016   Chronic back pain    all over   Chronic low back pain 01/11/2016   Complication of anesthesia    tends to have hypotension when NPO and post-anesthesia   Constipation    takes stool softener daily   Degenerative disk disease    Degenerative joint disease    DVT (deep venous thrombosis) (HCC) 2014   RLE   Family history of adverse reaction to anesthesia    a family member woke up during surgery; think it was my mom   Fibromyalgia    Generalized osteoarthritis of multiple sites 11/04/2013   GERD (gastroesophageal reflux disease)    Headache    Heart palpitations 12/14/2013   resolved   History of blood clots    superficial   Hypoglycemia    Hypothyroid    takes Synthroid  daily   Incomplete emptying of bladder    Insomnia    takes Trazodone  nightly   Iron  deficiency anemia    takes Ferrous Sulfate  daily   Joint pain    Joint swelling    knees and ankles   Memory disorder 08/14/2016   Morbid obesity (HCC)    Nausea    takes Zofran  as needed.Seeing GI doc   Neck pain 05/25/2016   OSA on CPAP    tested more than 5 yrs ago.     Osteoarthritis    Osteopenia    in feet   PFO (patent foramen  ovale)    no murmur   PFO (patent foramen ovale)    Pre-diabetes    Primary osteoarthritis of both feet 05/29/2016   Right bunionectomy August 2017 by Dr. Harden   PVC's (premature ventricular contractions) 10/11/2021   Monitor 9/23: 1.8% PVCs; rare PACs; o/w NSR   Scoliosis    Skin abnormality 02/04/2020   raised area on lip   Stroke [facility]) severallast 2014   right foot weakness; memory issues, black spot right visual field since (03/23/2015)   Thrombophlebitis    Trochanteric bursitis of both hips 05/25/2016   Unilateral primary osteoarthritis, right knee 05/29/2016   Urinary urgency 04/19/2016   Vein disorder 11/29/2010   varicose veins both legs   Wears glasses    Past Surgical History:  Procedure Laterality Date   BIOPSY  11/14/2018   Procedure: BIOPSY;  Surgeon: Golda Claudis PENNER, MD;  [facility];  Service: Endoscopy;;  esophagusgastric   BIOPSY  06/07/2022   Procedure: BIOPSY;  Surgeon: Eartha Angelia Sieving, MD;  Location: [facility];  Service: Gastroenterology;;   BONE EXCISION Right 08/29/2017   Procedure: right  trapezium excision;  Surgeon: Murrell Kuba, MD;  Location: [facility];  Service: Orthopedics;  Laterality: Right;   BUNIONECTOMY Right 08/2015   CARDIAC CATHETERIZATION     2008.  it was fine (not sure why she had it done, and doesn't know where)   CARPOMETACARPEL SUSPENSION PLASTY Right 08/29/2017   Procedure: SUSPENSION PLASTY RIGHT THUMB;  Surgeon: Murrell Kuba, MD;  Location: [facility];  Service: Orthopedics;  Laterality: Right;   COLONOSCOPY N/A 03/25/2013   Procedure: COLONOSCOPY;  Surgeon: Claudis RAYMOND Rivet, MD;  [facility]: [facility];  Service: Endoscopy;  Laterality: N/A;  930   ERCP  06/02/2020   ESOPHAGOGASTRODUODENOSCOPY     ESOPHAGOGASTRODUODENOSCOPY (EGD) WITH PROPOFOL  N/A 11/14/2018   Procedure: ESOPHAGOGASTRODUODENOSCOPY (EGD) WITH PROPOFOL ;  Surgeon: Rivet Claudis RAYMOND, MD;  [facility];  Service: Endoscopy;  Laterality: N/A;  1:55pm-office moved to 11:00am/pt notified to arrive at 9:30am per KF   ESOPHAGOGASTRODUODENOSCOPY (EGD) WITH PROPOFOL  N/A 06/07/2022   Procedure: ESOPHAGOGASTRODUODENOSCOPY (EGD) WITH PROPOFOL ;  Surgeon: Eartha Angelia Sieving, MD;  [facility]: [facility];  Service: Gastroenterology;  Laterality: N/A;  2:00pm;asa 3   EXPLORATORY LAPAROTOMY     took fallopian tubes out   HALLUX FUSION Right 02/12/2020   Procedure: HALLUX INTERPHANGEAL JOINT  FUSION;  Surgeon: Tobie Franky SQUIBB, DPM;  Location: [facility];  Service: Podiatry;  Laterality: Right;   HAMMER TOE SURGERY Right 02/12/2020   Procedure: SECOND AND THIRD HAMMER TOE CORRECTION; CAPSULOTOMY SECOND INTERPHALANGEAL JOINT;  Surgeon: Tobie Franky SQUIBB, DPM;  Location: [facility];  Service: Podiatry;  Laterality: Right;   JOINT REPLACEMENT     bil knee    KNEE ARTHROSCOPY Left    KNEE ARTHROSCOPY W/ ACL RECONSTRUCTION Right yrs ago   added pins   LAPAROSCOPIC CHOLECYSTECTOMY  ~ 2001   ROUX-EN-Y GASTRIC BYPASS  11/20/2010   [place]   SPINAL CORD STIMULATOR INSERTION N/A 04/18/2017   Procedure: LUMBAR SPINAL CORD STIMULATOR INSERTION;  Surgeon: Mindi Mt, MD;  Location: [facility];  Service: Neurosurgery;  Laterality: N/A;  LUMBAR SPINAL CORD STIMULATOR INSERTION   stomach stent  04/28/2020   TENDON TRANSFER Right 08/29/2017   Procedure: right abductor pollicis longus transfer;  Surgeon: Murrell Kuba, MD;  Location: [facility];  Service: Orthopedics;  Laterality: Right;   TOTAL KNEE ARTHROPLASTY Left 03/23/2015   Procedure: TOTAL KNEE ARTHROPLASTY;  Surgeon: Jerona Harden GAILS, MD;  Location: [facility];  Service: Orthopedics;  Laterality: Left;   TOTAL KNEE ARTHROPLASTY Right 08/15/2016   Procedure: RIGHT TOTAL KNEE ARTHROPLASTY, REMOVAL ACL SCREWS;  Surgeon: Harden Jerona GAILS, MD;  Location: [facility];  Service: Orthopedics;  Laterality: Right;   TOTAL KNEE  ARTHROPLASTY WITH HARDWARE REMOVAL Right    UPPER GI ENDOSCOPY  12/06/2022   VAGINAL HYSTERECTOMY     tah/bso   VARICOSE VEIN SURGERY Right X 2   WEIL OSTEOTOMY Right 02/12/2020   Procedure: DOUBLE L OSTEOTOMY;  Surgeon: Tobie Franky SQUIBB, DPM;  Location:  SURGERY CENTER;  Service: Podiatry;  Laterality: Right;   Patient Active Problem List   Diagnosis Date Noted   Nausea without vomiting 07/02/2023   Encounter for screening colonoscopy 07/02/2023   Belching 03/12/2023   History of seizures 07/10/2022   Melena 05/15/2022   PVC's (premature ventricular contractions) 10/11/2021   Factor V Leiden mutation (HCC) 10/11/2021   Other secondary scoliosis, lumbar region    Other spondylosis with radiculopathy, lumbar region  Spondylolisthesis, lumbar region    Thoracic spondylosis with radiculopathy    Radiculopathy, lumbar region    Breakdown (mechanical) of implanted electronic neurostimulator of spinal cord electrode (lead), sequela    Degenerative disc disease, lumbar    At risk for adverse drug event 08/31/2021   Acute on chronic blood loss anemia 08/28/2021   Drug-induced constipation 08/28/2021   GERD without esophagitis 08/28/2021   Overactive bladder 08/28/2021   Chronic migraine without aura 08/28/2021   Hx of blood clots 08/28/2021   Major neurocognitive disorder (HCC) 08/28/2021   Status post lumbar spinal fusion 08/21/2021   Weight gain 07/13/2021   Encounter for removal of internal fixation device 10/21/2020   LFTs abnormal 03/02/2020   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   Gastroesophageal reflux disease 09/22/2019   SSBE (short-segment Barrett's esophagus) 09/22/2019   Flatulence 09/22/2019   Migraine 11/11/2018   Abdominal pain, epigastric 10/14/2018   LUQ pain 10/14/2018   Attention deficit hyperactivity disorder (ADHD) 01/12/2018   Insomnia 01/12/2018   Elevated liver enzymes 09/10/2017   History of diabetes mellitus  06/06/2017   Primary osteoarthritis of right hand 06/06/2017   Transaminasemia    TIA (transient ischemic attack) 05/01/2017   Dysphasia 05/01/2017   Chronic pain syndrome 05/01/2017   Confusion    Presence of right artificial knee joint 09/17/2016   H/O total knee replacement, right 08/15/2016   Presence of retained hardware    Memory disorder 08/14/2016   Cerebrovascular disease 08/14/2016   Unilateral primary osteoarthritis, right knee 05/29/2016   Primary osteoarthritis of both feet 05/29/2016   Trochanteric bursitis of both hips 05/25/2016   Neck pain 05/25/2016   Urinary urgency 04/19/2016   Chronic low back pain 01/11/2016   Total knee replacement status 03/23/2015   Heart palpitations 12/14/2013   Generalized osteoarthritis of multiple sites 11/04/2013   Elevated LFTs 03/17/2012   Cerebral infarction, unspecified (HCC) 11/29/2011   Cerebral infarction (HCC) 10/30/2011   PFO (patent foramen ovale) 10/30/2011   Acquired hypothyroidism    Thrombophlebitis    OSA on CPAP    Fibromyalgia    Status post bariatric surgery 12/07/2010   Vein disorder 11/29/2010   S/P total hysterectomy and bilateral salpingo-oophorectomy 11/29/2010   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 Pt states she has been doing HEP but does note some increased back pain following. Pt states that knees are bothering her worse than her back right now.   Eval:  Pt reports mid to low back pain. Pt states she had back surgery and states the pain has gotten worse over the past few months. Pt states when she is up moving around she tends to slouched forward. Pt states she needs strengthening in her back muscles to avoid bending forward. Pt states she has been using SPC for years even before back surgery  PERTINENT HISTORY:  -Pt has had several strokes last one in 2014, right foot drags, memory issues -Diabetes, controlled with diet -Neuropathy -Arthritis -Fibromyalgia   PAIN:  Are you having pain? Yes: NPRS scale: 4/10 medicated now, worst in last week 8/10 Pain location: low and mid back pain Pain description: ache, dull and sharp on occasion Aggravating factors: standing up cooking, walking, sitting too long, Relieving factors: bending forward, extension with self pressure to low back, massage, heat, medications, lays on left side  PRECAUTIONS: None  RED FLAGS: Bowel or bladder incontinence: Yes: years   WEIGHT BEARING RESTRICTIONS: No  FALLS:  Has patient fallen in last 6 months? Yes. Number of falls 1, just loss of balance  OCCUPATION: disability  PLOF: Independent with household mobility with device, Requires assistive device for independence, and Needs assistance with homemaking, pt lives with mother, and husband  PATIENT GOALS: strengthen low/mid back, increase activity tolerance, improve walking and balance  NEXT MD VISIT: after therapy  OBJECTIVE:  Note: Objective measures were completed at Evaluation unless otherwise noted.  DIAGNOSTIC FINDINGS:  XRs of the thoracic spine from 06/24/2023 were independently reviewed and  interpreted, showing posterior instrumentation to T10. Several of the  screws appear to breach the superior endplate of their respective   vertebra. The T10 screws have lucency around them. There is kyphosis above  her fusion that measures 14 degrees. Her films immediately after surgery  measure about 12 degrees of kyphosis ([place]attes). No fracture or dislocation  seen.  PATIENT SURVEYS:  08/22/23:  Modified Oswestry Low Back Pain Disability Questionnaire: 25 / 50 = 50.0  09/05/23:  Modified Oswestry Low Back Pain Disability Questionnaire: 23 / 50 = 46.0 %  COGNITION: Overall cognitive status: memory issues     SENSATION: Light touch: Impaired , just feet, sensitivity to cold  POSTURE: rounded shoulders, forward head, decreased lumbar lordosis, increased thoracic kyphosis, and flexed trunk   PALPATION: Pt demonstrates tenderness to palpation of spinous processes of T5-T6 and L4-L5. Pt also demonstrates increased tone on right side of thoracic and lumbar spine.  THORACOLUMBAR ROM:   AROM eval  Flexion   Extension   Right lateral flexion 30 degrees, Pain, worse  Left lateral flexion 35 degrees, pain  Right rotation   Left rotation    (Blank rows = not tested)  LOWER EXTREMITY ROM:     Active  Right eval Left eval  Hip flexion    Hip extension    Hip abduction    Hip adduction    Hip internal rotation    Hip external rotation    Knee flexion    Knee extension    Ankle dorsiflexion    Ankle plantarflexion    Ankle inversion    Ankle eversion     (Blank rows = not tested)  LOWER EXTREMITY MMT:    MMT Right eval Left eval  Right 09/05/23 Left 09/05/23  Hip flexion 4 3 4+ 4  Hip extension 4 4 4 4   Hip abduction 4 4 4 4   Hip adduction 4 4    Hip internal rotation      Hip external rotation      Knee flexion 4 4 4+ 4+  Knee extension 4 4 4+ 4+  Ankle dorsiflexion 4 4 4+ 4+  Ankle plantarflexion      Ankle inversion      Ankle eversion       (Blank rows = not tested)    FUNCTIONAL TESTS:  Eval: 5 times sit to stand: 9.01 seconds 2 minute walk test: 351 feet 09/05/23: 420ft with  SPC 5STS 6.88  GAIT: Distance walked: 400 feet Assistive device utilized: Single point cane and None Level of assistance: Modified independence Comments: increased flexion of trunk as test progressed  TREATMENT DATE:  09/10/2023  Therapeutic Exercise: -Nustep, 5 minutes, level 3 resistance, pt avgs about 60 SPM, pt cued for proper extremity placement -Standing shoulder rows, GTB at chest height, 2 sets of 10 reps -Seated lumbar flexion blue theraball rollout, 2 sets of 10 reps, pt cued for increased force through UE for decreased pressure on back, second set side to side added, pt requested shorter chair next session -Standing shoulder extensions, 2 sets of 10 reps, GTB at chest height   Neuromuscular Re-education: -Resisted walking with 4lb ankle weights, lateral stepping, marching,  4 laps on 10 foot black line, pt only able to complete 3 laps -Supine bridges 2 sets of 10 reps, 3 second holds, blue theraband at knees, pt cued for max hip extension -Dead bugs, on mat table, 1 set of 8 reps, pt cued for proper sequencing    09/05/23:   430ft with SPC Modified Oswestry Low Back Pain Disability Questionnaire: 23 / 50 = 46.0% MMT see above 5STS 6.88  Standing:  RTB Rows 10x  RTB shoulder extension 10x   08/29/23 Seated: Scapular retraction 10X5  Sit to stand 10X  Long arc quad 10X5  Supine: decompression 2-5, 5X5 holds  Bridge 2X10  LTR 10X5 holds  SLR Sidleying hip abudction   Hip adduction Prone hip extension    PATIENT EDUCATION:  Education details: Pt was educated on findings of PT evaluation, prognosis, frequency of therapy visits and rationale, attendance policy, and HEP if given.   Person educated: Patient Education method: Explanation, Verbal cues, and Handouts Education comprehension: verbalized understanding, verbal cues required, and needs further education  HOME EXERCISE PROGRAM: Access Code: [email or web address] URL:  [email or web address] Date: 07/23/2023 Prepared by: Lang Ada  Exercises - Supine Bridge  - 1 x daily - 7 x weekly - 3 sets - 10 reps - Supine Lower Trunk Rotation  - 1 x daily - 7 x weekly - 3 sets - 10 reps - Seated Scapular Retraction  - 1 x daily - 7 x weekly - 3 sets - 10 reps  08/22/23: Decompression  Access Code: [email or web address] URL: [email or web address] Date: 08/29/2023 - Seated Long Arc Quad  - 1 x daily - 7 x weekly - 3 sets - 10 reps - 5 sec hold - Active Straight Leg Raise with Quad Set  - 1 x daily - 7 x weekly - 3 sets - 10 reps - Sidelying Hip Abduction  - 1 x daily - 7 x weekly - 3 sets - 10 reps - Sidelying Hip Adduction  - 1 x daily - 7 x weekly - 3 sets -  10 reps - Prone Heel Squeeze  - 1 x daily - 7 x weekly - 3 sets - 10 reps - 5 sec hold - Prone Hip Extension  - 1 x daily - 7 x weekly - 3 sets - 10 reps 09/10/23: Access Code: [email or web address] URL: [email or web address] Date: 09/10/2023 Prepared by: Lang Ada  Exercises - Supine Dead Bug with Leg Extension  - 1 x daily - 7 x weekly - 3 sets - 10 reps  ASSESSMENT:  CLINICAL IMPRESSION: Patient continues to demonstrate increased low back and bilateral knee pain, decreased LE/core strength, decreased endurance, decreased gait quality and balance. Patient also demonstrates decreased endurance with need for multiple rest breaks during today's session. Patient able to progress dynamic balance and core activation exercises today with resisted walking and dead bugs, good performance with verbal cueing. Patient would continue to benefit from skilled physical therapy for decreased low back pain, increased endurance with ambulation, increased LE/core strength, and improved balance for improved quality of life, improved independence with gait training and continued progress towards therapy goals.   Eval:  Patient is a 63 y.o. female who was seen today for physical therapy evaluation and  treatment for M54.9 (ICD-10-CM) - Mid back pain. Patient demonstrates decreased core/LE strength, abnormal pain rating in thoracic and lumbar spine, abnormal gait, and impaired balance. Patient also demonstrates difficulty with ambulation during today's session with decreased stride length and velocity noted. Patient also demonstrates ability to complete the without an AD although gait deviations became more apparent at test progressed. Patient requires education on role of PT, prognosis, HEP and compliance. Patient would benefit from skilled physical therapy for increased endurance with ambulation, increased core/LE strength, and balance for improved gait quality, return to higher level of function with ADLs, and progress towards therapy goals.   OBJECTIVE IMPAIRMENTS: Abnormal gait, decreased activity tolerance, decreased balance, decreased endurance, decreased mobility, difficulty walking, decreased ROM, decreased strength, postural dysfunction, and pain.   ACTIVITY LIMITATIONS: carrying, lifting, squatting, transfers, bed mobility, continence, and locomotion level  PARTICIPATION LIMITATIONS: meal prep, cleaning, laundry, shopping, community activity, and yard work  PERSONAL FACTORS: Fitness, Past/current experiences, Time since onset of injury/illness/exacerbation, and 1-2 comorbidities: hx of back surgery, diabetes are also affecting patient's functional outcome.   REHAB POTENTIAL: Fair chronic in nature  CLINICAL DECISION MAKING: Evolving/moderate complexity  EVALUATION COMPLEXITY: Moderate   GOALS: Goals reviewed with patient? Yes  SHORT TERM GOALS: Target date: 07/22/225  Pt will be independent with HEP in order to demonstrate participation in Physical Therapy POC.  Baseline:  09/05/23:  Reports compliance with HEP 2-3 times a week Goal status: IN PROGRESS  2.  Pt will report 3/10 pain at worst with mobility in order to demonstrate improved pain with ADLs.  Baseline: 09/05/23:   average pain scale 3-4/10 Goal status: IN PROGRESS  LONG TERM GOALS: Target date: 09/03/23  Pt will improve 5TSTS by at least 2.3 seconds in order to demonstrate improved functional strength to return to desired activities.  Baseline: see objective.  Goal status: MET  2.  Pt will improve 2 MWT by 50 feet without AD in order to demonstrate improved functional ambulatory capacity in community setting.  Baseline: see objective. ;09/05/23:  464ft with SPC Goal status: IN PROGRESS  3.  Pt will improve Modified Oswestry score by at least 6 points in order to demonstrate improved pain with functional goals and outcomes. Baseline: see objective. ;  Modified Oswestry Low Back Pain Disability Questionnaire: 23 / 50 =  46.0 % Goal status: IN PROGRESS  4.  Pt will report 1/10 pain at worst with mobility in order to demonstrate reduced pain with ADLs lasting greater than 30 minutes.  Baseline: see objective.  Goal status: IN PROGRESS   PLAN:  PT FREQUENCY: 1-2x/week  PT DURATION: 4 weeks  PLANNED INTERVENTIONS: 97110-Therapeutic exercises, 97530- Therapeutic activity, [id number]- Neuromuscular re-education, 97535- Self Care, [id number]- Manual therapy, [phone]- Gait training, Patient/Family education, Balance training, Stair training, Spinal mobilization, DME instructions, Cryotherapy, and Moist heat.  PLAN FOR NEXT SESSION: Progress lumbar and thoracic spine ROM and strengthening of paraspinals, progress gait/balance training.  Continue postural strengthening with band next session, give as HEP when good form.   Pearline Yerby, PT, DPT [facility] Office: [phone] 3:25 PM, 09/10/23

## 2023-09-12 ENCOUNTER — Encounter (HOSPITAL_COMMUNITY): Payer: Self-pay

## 2023-09-12 ENCOUNTER — Ambulatory Visit (HOSPITAL_COMMUNITY)

## 2023-09-12 DIAGNOSIS — Z7409 Other reduced mobility: Secondary | ICD-10-CM

## 2023-09-12 DIAGNOSIS — M5459 Other low back pain: Secondary | ICD-10-CM

## 2023-09-12 DIAGNOSIS — M546 Pain in thoracic spine: Secondary | ICD-10-CM

## 2023-09-12 NOTE — Therapy (Signed)
 OUTPATIENT PHYSICAL THERAPY THORACOLUMBAR TREATMENT   Patient Name: Debbie Pineda MRN: [id number] DOB:06-Dec-1960, 63 y.o., female Today's Date: 09/12/2023  END OF SESSION:  PT End of Session - 09/12/23 1434     Visit Number 6    Number of Visits 12    Date for PT Re-Evaluation 10/04/23    Authorization Type [employer]    Authorization Time Period no auth required    PT Start Time 1435    PT Stop Time 1513    PT Time Calculation (min) 38 min    Activity Tolerance Patient tolerated treatment well    Behavior During Therapy WFL for tasks assessed/performed             Past Medical History:  Diagnosis Date   ADD (attention deficit disorder)    Arthritis    all over   Cerebral infarction (HCC) 10/30/2011   Cerebrovascular disease 08/14/2016   Chronic back pain    all over   Chronic low back pain 01/11/2016   Complication of anesthesia    tends to have hypotension when NPO and post-anesthesia   Constipation    takes stool softener daily   Degenerative disk disease    Degenerative joint disease    DVT (deep venous thrombosis) (HCC) 2014   RLE   Family history of adverse reaction to anesthesia    a family member woke up during surgery; think it was my mom   Fibromyalgia    Generalized osteoarthritis of multiple sites 11/04/2013   GERD (gastroesophageal reflux disease)    Headache    Heart palpitations 12/14/2013   resolved   History of blood clots    superficial   Hypoglycemia    Hypothyroid    takes Synthroid  daily   Incomplete emptying of bladder    Insomnia    takes Trazodone  nightly   Iron  deficiency anemia    takes Ferrous Sulfate  daily   Joint pain    Joint swelling    knees and ankles   Memory disorder 08/14/2016   Morbid obesity (HCC)    Nausea    takes Zofran  as needed.Seeing GI doc   Neck pain 05/25/2016   OSA on CPAP    tested more than 5 yrs ago.     Osteoarthritis    Osteopenia    in feet   PFO (patent foramen  ovale)    no murmur   PFO (patent foramen ovale)    Pre-diabetes    Primary osteoarthritis of both feet 05/29/2016   Right bunionectomy August 2017 by Dr. Harden   PVC's (premature ventricular contractions) 10/11/2021   Monitor 9/23: 1.8% PVCs; rare PACs; o/w NSR   Scoliosis    Skin abnormality 02/04/2020   raised area on lip   Stroke [facility]) severallast 2014   right foot weakness; memory issues, black spot right visual field since (03/23/2015)   Thrombophlebitis    Trochanteric bursitis of both hips 05/25/2016   Unilateral primary osteoarthritis, right knee 05/29/2016   Urinary urgency 04/19/2016   Vein disorder 11/29/2010   varicose veins both legs   Wears glasses    Past Surgical History:  Procedure Laterality Date   BIOPSY  11/14/2018   Procedure: BIOPSY;  Surgeon: Golda Claudis PENNER, MD;  [facility];  Service: Endoscopy;;  esophagusgastric   BIOPSY  06/07/2022   Procedure: BIOPSY;  Surgeon: Eartha Angelia Sieving, MD;  [facility]: [facility];  Service: Gastroenterology;;   BONE EXCISION Right 08/29/2017   Procedure:  right trapezium excision;  Surgeon: Murrell Kuba, MD;  Location: [facility];  Service: Orthopedics;  Laterality: Right;   BUNIONECTOMY Right 08/2015   CARDIAC CATHETERIZATION     2008.  it was fine (not sure why she had it done, and doesn't know where)   CARPOMETACARPEL SUSPENSION PLASTY Right 08/29/2017   Procedure: SUSPENSION PLASTY RIGHT THUMB;  Surgeon: Murrell Kuba, MD;  Location: [facility];  Service: Orthopedics;  Laterality: Right;   COLONOSCOPY N/A 03/25/2013   Procedure: COLONOSCOPY;  Surgeon: Claudis RAYMOND Rivet, MD;  [facility]: [facility];  Service: Endoscopy;  Laterality: N/A;  930   ERCP  06/02/2020   ESOPHAGOGASTRODUODENOSCOPY     ESOPHAGOGASTRODUODENOSCOPY (EGD) WITH PROPOFOL  N/A 11/14/2018   Procedure: ESOPHAGOGASTRODUODENOSCOPY (EGD) WITH PROPOFOL ;  Surgeon: Rivet Claudis RAYMOND, MD;  [facility];  Service: Endoscopy;  Laterality: N/A;  1:55pm-office moved to 11:00am/pt notified to arrive at 9:30am per KF   ESOPHAGOGASTRODUODENOSCOPY (EGD) WITH PROPOFOL  N/A 06/07/2022   Procedure: ESOPHAGOGASTRODUODENOSCOPY (EGD) WITH PROPOFOL ;  Surgeon: Eartha Angelia Sieving, MD;  [facility];  Service: Gastroenterology;  Laterality: N/A;  2:00pm;asa 3   EXPLORATORY LAPAROTOMY     took fallopian tubes out   HALLUX FUSION Right 02/12/2020   Procedure: HALLUX INTERPHANGEAL JOINT  FUSION;  Surgeon: Tobie Franky SQUIBB, DPM;  Location: [facility];  Service: Podiatry;  Laterality: Right;   HAMMER TOE SURGERY Right 02/12/2020   Procedure: SECOND AND THIRD HAMMER TOE CORRECTION; CAPSULOTOMY SECOND INTERPHALANGEAL JOINT;  Surgeon: Tobie Franky SQUIBB, DPM;  Location: [facility];  Service: Podiatry;  Laterality: Right;   JOINT REPLACEMENT     bil knee    KNEE ARTHROSCOPY Left    KNEE ARTHROSCOPY W/ ACL RECONSTRUCTION Right yrs ago   added pins   LAPAROSCOPIC CHOLECYSTECTOMY  ~ 2001   ROUX-EN-Y GASTRIC BYPASS  11/20/2010   [place]   SPINAL CORD STIMULATOR INSERTION N/A 04/18/2017   Procedure: LUMBAR SPINAL CORD STIMULATOR INSERTION;  Surgeon: Mindi Mt, MD;  Location: [facility];  Service: Neurosurgery;  Laterality: N/A;  LUMBAR SPINAL CORD STIMULATOR INSERTION   stomach stent  04/28/2020   TENDON TRANSFER Right 08/29/2017   Procedure: right abductor pollicis longus transfer;  Surgeon: Murrell Kuba, MD;  Location: [facility];  Service: Orthopedics;  Laterality: Right;   TOTAL KNEE ARTHROPLASTY Left 03/23/2015   Procedure: TOTAL KNEE ARTHROPLASTY;  Surgeon: Jerona Harden GAILS, MD;  Location: [facility];  Service: Orthopedics;  Laterality: Left;   TOTAL KNEE ARTHROPLASTY Right 08/15/2016   Procedure: RIGHT TOTAL KNEE ARTHROPLASTY, REMOVAL ACL SCREWS;  Surgeon: Harden Jerona GAILS, MD;  Location: [facility];  Service: Orthopedics;  Laterality: Right;   TOTAL KNEE  ARTHROPLASTY WITH HARDWARE REMOVAL Right    UPPER GI ENDOSCOPY  12/06/2022   VAGINAL HYSTERECTOMY     tah/bso   VARICOSE VEIN SURGERY Right X 2   WEIL OSTEOTOMY Right 02/12/2020   Procedure: DOUBLE L OSTEOTOMY;  Surgeon: Tobie Franky SQUIBB, DPM;  Location: [facility];  Service: Podiatry;  Laterality: Right;   Patient Active Problem List   Diagnosis Date Noted   Nausea without vomiting 07/02/2023   Encounter for screening colonoscopy 07/02/2023   Belching 03/12/2023   History of seizures 07/10/2022   Melena 05/15/2022   PVC's (premature ventricular contractions) 10/11/2021   Factor V Leiden mutation (HCC) 10/11/2021   Other secondary scoliosis, lumbar region    Other spondylosis with radiculopathy, lumbar region  Spondylolisthesis, lumbar region    Thoracic spondylosis with radiculopathy    Radiculopathy, lumbar region    Breakdown (mechanical) of implanted electronic neurostimulator of spinal cord electrode (lead), sequela    Degenerative disc disease, lumbar    At risk for adverse drug event 08/31/2021   Acute on chronic blood loss anemia 08/28/2021   Drug-induced constipation 08/28/2021   GERD without esophagitis 08/28/2021   Overactive bladder 08/28/2021   Chronic migraine without aura 08/28/2021   Hx of blood clots 08/28/2021   Major neurocognitive disorder (HCC) 08/28/2021   Status post lumbar spinal fusion 08/21/2021   Weight gain 07/13/2021   Encounter for removal of internal fixation device 10/21/2020   LFTs abnormal 03/02/2020   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   Gastroesophageal reflux disease 09/22/2019   SSBE (short-segment Barrett's esophagus) 09/22/2019   Flatulence 09/22/2019   Migraine 11/11/2018   Abdominal pain, epigastric 10/14/2018   LUQ pain 10/14/2018   Attention deficit hyperactivity disorder (ADHD) 01/12/2018   Insomnia 01/12/2018   Elevated liver enzymes 09/10/2017   History of diabetes mellitus  06/06/2017   Primary osteoarthritis of right hand 06/06/2017   Transaminasemia    TIA (transient ischemic attack) 05/01/2017   Dysphasia 05/01/2017   Chronic pain syndrome 05/01/2017   Confusion    Presence of right artificial knee joint 09/17/2016   H/O total knee replacement, right 08/15/2016   Presence of retained hardware    Memory disorder 08/14/2016   Cerebrovascular disease 08/14/2016   Unilateral primary osteoarthritis, right knee 05/29/2016   Primary osteoarthritis of both feet 05/29/2016   Trochanteric bursitis of both hips 05/25/2016   Neck pain 05/25/2016   Urinary urgency 04/19/2016   Chronic low back pain 01/11/2016   Total knee replacement status 03/23/2015   Heart palpitations 12/14/2013   Generalized osteoarthritis of multiple sites 11/04/2013   Elevated LFTs 03/17/2012   Cerebral infarction, unspecified (HCC) 11/29/2011   Cerebral infarction (HCC) 10/30/2011   PFO (patent foramen ovale) 10/30/2011   Acquired hypothyroidism    Thrombophlebitis    OSA on CPAP    Fibromyalgia    Status post bariatric surgery 12/07/2010   Vein disorder 11/29/2010   S/P total hysterectomy and bilateral salpingo-oophorectomy 11/29/2010   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 Pt states she was pretty sore after last session in knees and back but feeling better today.  Eval:  Pt reports mid to low back pain. Pt states she had back surgery and states the pain has gotten worse over the past few months. Pt states when she is up moving around she tends to slouched forward. Pt states she needs strengthening in her back muscles to avoid bending forward. Pt states she has been using SPC for years even before back surgery  PERTINENT HISTORY:  -Pt has had several strokes last one in 2014, right foot drags, memory issues -Diabetes, controlled with diet -Neuropathy -Arthritis -Fibromyalgia   PAIN:  Are you having pain? Yes: NPRS scale: 4/10 medicated now, worst in last week 8/10 Pain location: low and mid back pain Pain description: ache, dull and sharp on occasion Aggravating factors: standing up cooking, walking, sitting too long, Relieving factors: bending forward, extension with self pressure to low back, massage, heat, medications, lays on left side  PRECAUTIONS: None  RED FLAGS: Bowel or bladder incontinence: Yes: years   WEIGHT BEARING RESTRICTIONS: No  FALLS:  Has patient fallen in last 6 months? Yes. Number of falls 1, just loss of balance  OCCUPATION: disability  PLOF: Independent with household mobility with device, Requires assistive device for independence, and Needs assistance with homemaking, pt lives with mother, and husband  PATIENT GOALS: strengthen low/mid back, increase activity tolerance, improve walking and balance  NEXT MD VISIT: after therapy  OBJECTIVE:  Note: Objective measures were completed at Evaluation unless otherwise noted.  DIAGNOSTIC FINDINGS:  XRs of the thoracic spine from 06/24/2023 were independently reviewed and  interpreted, showing posterior instrumentation to T10. Several of the  screws appear to breach the superior endplate of their respective  vertebra. The T10 screws have lucency around them. There  is kyphosis above  her fusion that measures 14 degrees. Her films immediately after surgery  measure about 12 degrees of kyphosis ([place]). No fracture or dislocation  seen.  PATIENT SURVEYS:  08/22/23:  Modified Oswestry Low Back Pain Disability Questionnaire: 25 / 50 = 50.0  09/05/23:  Modified Oswestry Low Back Pain Disability Questionnaire: 23 / 50 = 46.0 %  COGNITION: Overall cognitive status: memory issues     SENSATION: Light touch: Impaired , just feet, sensitivity to cold  POSTURE: rounded shoulders, forward head, decreased lumbar lordosis, increased thoracic kyphosis, and flexed trunk   PALPATION: Pt demonstrates tenderness to palpation of spinous processes of T5-T6 and L4-L5. Pt also demonstrates increased tone on right side of thoracic and lumbar spine.  THORACOLUMBAR ROM:   AROM eval  Flexion   Extension   Right lateral flexion 30 degrees, Pain, worse  Left lateral flexion 35 degrees, pain  Right rotation   Left rotation    (Blank rows = not tested)  LOWER EXTREMITY ROM:     Active  Right eval Left eval  Hip flexion    Hip extension    Hip abduction    Hip adduction    Hip internal rotation    Hip external rotation    Knee flexion    Knee extension    Ankle dorsiflexion    Ankle plantarflexion    Ankle inversion    Ankle eversion     (Blank rows = not tested)  LOWER EXTREMITY MMT:    MMT Right eval Left eval Right 09/05/23 Left 09/05/23  Hip flexion 4 3 4+ 4  Hip extension 4 4 4 4   Hip abduction 4 4 4 4   Hip adduction 4 4    Hip internal rotation      Hip external rotation      Knee flexion 4 4 4+ 4+  Knee extension 4 4 4+ 4+  Ankle dorsiflexion 4 4 4+ 4+  Ankle plantarflexion      Ankle inversion      Ankle eversion       (Blank rows = not tested)    FUNCTIONAL TESTS:  Eval: 5 times sit to stand: 9.01 seconds 2 minute walk test: 351 feet 09/05/23: 434ft with SPC 5STS 6.88  GAIT: Distance walked: 400 feet Assistive  device utilized: Single point cane and None Level of assistance: Modified independence Comments: increased flexion of trunk as test progressed  TREATMENT DATE:  09/12/2023  Therapeutic Exercise: -Treadmill, 5 minutes, grade 0.0, speed 1.4>1.8>2.0>2.2>1.8, pt cued for proper distance from monitor and upright posture -Paloff Press, 2 sets of 10 reps, bilaterally, RTB, pt cued for decreased rotation during shoulder flexion phase -Lat pull downs, plate 3 and plate 4, 2 sets of 10 reps, pt cued for contraction to be felt all the way through scapula and mid back -Cat cow stretch, 1 set of 10 reps, pt cued for hands underneath shoulders -Childs pose to prone press ups, 1 set of 10 reps, pt cued for hand placement and 5 second hold each rep Neuromuscular Re-education: -Side bridges, 1 set of 2 reps, 15 second holds bilaterally, pt cued for knee hip shoulder alignment -Dead bugs, on mat table, 2 set of 10 reps, pt cued for proper sequencing -STS with tidal tank sphere for core activation from lowered mat table, 2 sets of 8 reps, pt cued to scoot to edge of seat to avoid pushing off of LE   09/10/2023  Therapeutic Exercise: -Nustep, 5 minutes, level 3 resistance, pt avgs about 60 SPM, pt cued for proper extremity placement -Standing shoulder rows, GTB at chest height, 2 sets of 10 reps -Seated lumbar flexion blue theraball rollout, 2 sets of 10 reps, pt cued for increased force through UE for decreased pressure on back, second set side to side added, pt requested shorter chair next session -Standing shoulder extensions, 2 sets of 10 reps, GTB at chest height Neuromuscular Re-education: -Resisted walking with 4lb ankle weights, lateral stepping, marching,  4 laps on 10 foot black line, pt only able to complete 3 laps -Supine bridges 2 sets of 10 reps, 3 second holds, blue theraband at knees, pt cued for max hip extension -Dead bugs, on mat table, 1 set of 8 reps, pt cued for proper  sequencing   09/05/23:   491ft with SPC Modified Oswestry Low Back Pain Disability Questionnaire: 23 / 50 = 46.0% MMT see above 5STS 6.88  Standing:  RTB Rows 10x  RTB shoulder extension 10x   PATIENT EDUCATION:  Education details: Pt was educated on findings of PT evaluation, prognosis, frequency of therapy visits and rationale, attendance policy, and HEP if given.   Person educated: Patient Education method: Explanation, Verbal cues, and Handouts Education comprehension: verbalized understanding, verbal cues required, and needs further education  HOME EXERCISE PROGRAM: Access Code: [email or web address] URL: [email or web address] Date: 07/23/2023 Prepared by: Lang Ada  Exercises - Supine Bridge  - 1 x daily - 7 x weekly - 3 sets - 10 reps - Supine Lower Trunk Rotation  - 1 x daily - 7 x weekly - 3 sets - 10 reps -  Seated Scapular Retraction  - 1 x daily - 7 x weekly - 3 sets - 10 reps  08/22/23: Decompression  Access Code: [email or web address] URL: [email or web address] Date: 08/29/2023 - Seated Long Arc Quad  - 1 x daily - 7 x weekly - 3 sets - 10 reps - 5 sec hold - Active Straight Leg Raise with Quad Set  - 1 x daily - 7 x weekly - 3 sets - 10 reps - Sidelying Hip Abduction  - 1 x daily - 7 x weekly - 3 sets - 10 reps - Sidelying Hip Adduction  - 1 x daily - 7 x weekly - 3 sets - 10 reps - Prone Heel Squeeze  - 1 x daily - 7 x weekly - 3 sets - 10 reps - 5 sec hold - Prone Hip Extension  - 1 x daily - 7 x weekly - 3 sets - 10 reps 09/10/23: Access Code: [email or web address] URL: [email or web address] Date: 09/10/2023 Prepared by: Lang Ada  Exercises - Supine Dead Bug with Leg Extension  - 1 x daily - 7 x weekly - 3 sets - 10 reps  ASSESSMENT:  CLINICAL IMPRESSION: Patient continues to demonstrate increased low back and bilateral knee pain, decreased LE/core strength, decreased endurance, decreased gait quality and balance. Patient also  demonstrates decreased endurance with need for multiple rest breaks again during today's session. Patient able to progress dynamic balance and core activation exercises today with side planks and paloff press, good performance with verbal cueing. Pt demonstrates increased activity tolerance with 5 minutes of walking on treadmill with minimal increase in back pain reported. Patient would continue to benefit from skilled physical therapy for decreased low back pain, increased endurance with ambulation, increased LE/core strength, and improved balance for improved quality of life, improved independence with gait training and continued progress towards therapy goals.   Eval:  Patient is a 63 y.o. female who was seen today for physical therapy evaluation and treatment for M54.9 (ICD-10-CM) - Mid back pain. Patient demonstrates decreased core/LE strength, abnormal pain rating in thoracic and lumbar spine, abnormal gait, and impaired balance. Patient also demonstrates difficulty with ambulation during today's session with decreased stride length and velocity noted. Patient also demonstrates ability to complete the without an AD although gait deviations became more apparent at test progressed. Patient requires education on role of PT, prognosis, HEP and compliance. Patient would benefit from skilled physical therapy for increased endurance with ambulation, increased core/LE strength, and balance for improved gait quality, return to higher level of function with ADLs, and progress towards therapy goals.   OBJECTIVE IMPAIRMENTS: Abnormal gait, decreased activity tolerance, decreased balance, decreased endurance, decreased mobility, difficulty walking, decreased ROM, decreased strength, postural dysfunction, and pain.   ACTIVITY LIMITATIONS: carrying, lifting, squatting, transfers, bed mobility, continence, and locomotion level  PARTICIPATION LIMITATIONS: meal prep, cleaning, laundry, shopping, community  activity, and yard work  PERSONAL FACTORS: Fitness, Past/current experiences, Time since onset of injury/illness/exacerbation, and 1-2 comorbidities: hx of back surgery, diabetes are also affecting patient's functional outcome.   REHAB POTENTIAL: Fair chronic in nature  CLINICAL DECISION MAKING: Evolving/moderate complexity  EVALUATION COMPLEXITY: Moderate   GOALS: Goals reviewed with patient? Yes  SHORT TERM GOALS: Target date: 07/22/225  Pt will be independent with HEP in order to demonstrate participation in Physical Therapy POC.  Baseline:  09/05/23:  Reports compliance with HEP 2-3 times a week Goal status: IN PROGRESS  2.  Pt will report 3/10 pain at worst with mobility in order to  demonstrate improved pain with ADLs.  Baseline: 09/05/23:  average pain scale 3-4/10 Goal status: IN PROGRESS  LONG TERM GOALS: Target date: 09/03/23  Pt will improve 5TSTS by at least 2.3 seconds in order to demonstrate improved functional strength to return to desired activities.  Baseline: see objective.  Goal status: MET  2.  Pt will improve 2 MWT by 50 feet without AD in order to demonstrate improved functional ambulatory capacity in community setting.  Baseline: see objective. ;09/05/23:  430ft with SPC Goal status: IN PROGRESS  3.  Pt will improve Modified Oswestry score by at least 6 points in order to demonstrate improved pain with functional goals and outcomes. Baseline: see objective. ;  Modified Oswestry Low Back Pain Disability Questionnaire: 23 / 50 = 46.0 % Goal status: IN PROGRESS  4.  Pt will report 1/10 pain at worst with mobility in order to demonstrate reduced pain with ADLs lasting greater than 30 minutes.  Baseline: see objective.  Goal status: IN PROGRESS   PLAN:  PT FREQUENCY: 1-2x/week  PT DURATION: 4 weeks  PLANNED INTERVENTIONS: 97110-Therapeutic exercises, 97530- Therapeutic activity, [id number]- Neuromuscular re-education, 97535- Self Care, [id number]- Manual  therapy, [phone]- Gait training, Patient/Family education, Balance training, Stair training, Spinal mobilization, DME instructions, Cryotherapy, and Moist heat.  PLAN FOR NEXT SESSION: Progress lumbar and thoracic spine ROM and strengthening of paraspinals, progress gait/balance training.  Continue postural strengthening with band next session, give as HEP when good form.   Jailah Willis, PT, DPT [facility] Office: [phone] 3:16 PM, 09/12/23

## 2023-09-15 ENCOUNTER — Other Ambulatory Visit (INDEPENDENT_AMBULATORY_CARE_PROVIDER_SITE_OTHER): Payer: Self-pay | Admitting: Gastroenterology

## 2023-09-15 DIAGNOSIS — Z9884 Bariatric surgery status: Secondary | ICD-10-CM

## 2023-09-15 DIAGNOSIS — K219 Gastro-esophageal reflux disease without esophagitis: Secondary | ICD-10-CM

## 2023-09-17 ENCOUNTER — Encounter (HOSPITAL_COMMUNITY): Payer: Self-pay

## 2023-09-17 ENCOUNTER — Ambulatory Visit (HOSPITAL_COMMUNITY)

## 2023-09-17 DIAGNOSIS — M5459 Other low back pain: Secondary | ICD-10-CM

## 2023-09-17 DIAGNOSIS — Z7409 Other reduced mobility: Secondary | ICD-10-CM

## 2023-09-17 DIAGNOSIS — M546 Pain in thoracic spine: Secondary | ICD-10-CM

## 2023-09-17 NOTE — Therapy (Signed)
 OUTPATIENT PHYSICAL THERAPY THORACOLUMBAR TREATMENT   Patient Name: Debbie Pineda MRN: [id number] DOB:09/14/1960, 63 y.o., female Today's Date: 09/17/2023  END OF SESSION:  PT End of Session - 09/17/23 1432     Visit Number 7    Number of Visits 12    Date for PT Re-Evaluation 10/04/23    Authorization Type [employer]    Authorization Time Period no auth required    PT Start Time 1432    PT Stop Time 1515    PT Time Calculation (min) 43 min    Activity Tolerance Patient tolerated treatment well    Behavior During Therapy WFL for tasks assessed/performed              Past Medical History:  Diagnosis Date   ADD (attention deficit disorder)    Arthritis    all over   Cerebral infarction (HCC) 10/30/2011   Cerebrovascular disease 08/14/2016   Chronic back pain    all over   Chronic low back pain 01/11/2016   Complication of anesthesia    tends to have hypotension when NPO and post-anesthesia   Constipation    takes stool softener daily   Degenerative disk disease    Degenerative joint disease    DVT (deep venous thrombosis) (HCC) 2014   RLE   Family history of adverse reaction to anesthesia    a family member woke up during surgery; think it was my mom   Fibromyalgia    Generalized osteoarthritis of multiple sites 11/04/2013   GERD (gastroesophageal reflux disease)    Headache    Heart palpitations 12/14/2013   resolved   History of blood clots    superficial   Hypoglycemia    Hypothyroid    takes Synthroid  daily   Incomplete emptying of bladder    Insomnia    takes Trazodone  nightly   Iron  deficiency anemia    takes Ferrous Sulfate  daily   Joint pain    Joint swelling    knees and ankles   Memory disorder 08/14/2016   Morbid obesity (HCC)    Nausea    takes Zofran  as needed.Seeing GI doc   Neck pain 05/25/2016   OSA on CPAP    tested more than 5 yrs ago.     Osteoarthritis    Osteopenia    in feet   PFO (patent foramen  ovale)    no murmur   PFO (patent foramen ovale)    Pre-diabetes    Primary osteoarthritis of both feet 05/29/2016   Right bunionectomy August 2017 by Dr. Harden   PVC's (premature ventricular contractions) 10/11/2021   Monitor 9/23: 1.8% PVCs; rare PACs; o/w NSR   Scoliosis    Skin abnormality 02/04/2020   raised area on lip   Stroke [facility]) severallast 2014   right foot weakness; memory issues, black spot right visual field since (03/23/2015)   Thrombophlebitis    Trochanteric bursitis of both hips 05/25/2016   Unilateral primary osteoarthritis, right knee 05/29/2016   Urinary urgency 04/19/2016   Vein disorder 11/29/2010   varicose veins both legs   Wears glasses    Past Surgical History:  Procedure Laterality Date   BIOPSY  11/14/2018   Procedure: BIOPSY;  Surgeon: Golda Claudis PENNER, MD;  [facility];  Service: Endoscopy;;  esophagusgastric   BIOPSY  06/07/2022   Procedure: BIOPSY;  Surgeon: Eartha Angelia Sieving, MD;  [facility]: [facility];  Service: Gastroenterology;;   BONE EXCISION Right 08/29/2017  Procedure: right trapezium excision;  Surgeon: Murrell Kuba, MD;  Location: [facility];  Service: Orthopedics;  Laterality: Right;   BUNIONECTOMY Right 08/2015   CARDIAC CATHETERIZATION     2008.  it was fine (not sure why she had it done, and doesn't know where)   CARPOMETACARPEL SUSPENSION PLASTY Right 08/29/2017   Procedure: SUSPENSION PLASTY RIGHT THUMB;  Surgeon: Murrell Kuba, MD;  Location: [facility];  Service: Orthopedics;  Laterality: Right;   COLONOSCOPY N/A 03/25/2013   Procedure: COLONOSCOPY;  Surgeon: Claudis RAYMOND Rivet, MD;  [facility]: [facility];  Service: Endoscopy;  Laterality: N/A;  930   ERCP  06/02/2020   ESOPHAGOGASTRODUODENOSCOPY     ESOPHAGOGASTRODUODENOSCOPY (EGD) WITH PROPOFOL  N/A 11/14/2018   Procedure: ESOPHAGOGASTRODUODENOSCOPY (EGD) WITH PROPOFOL ;  Surgeon: Rivet Claudis RAYMOND, MD;  [facility];  Service: Endoscopy;  Laterality: N/A;  1:55pm-office moved to 11:00am/pt notified to arrive at 9:30am per KF   ESOPHAGOGASTRODUODENOSCOPY (EGD) WITH PROPOFOL  N/A 06/07/2022   Procedure: ESOPHAGOGASTRODUODENOSCOPY (EGD) WITH PROPOFOL ;  Surgeon: Eartha Angelia Sieving, MD;  [facility];  Service: Gastroenterology;  Laterality: N/A;  2:00pm;asa 3   EXPLORATORY LAPAROTOMY     took fallopian tubes out   HALLUX FUSION Right 02/12/2020   Procedure: HALLUX INTERPHANGEAL JOINT  FUSION;  Surgeon: Tobie Franky SQUIBB, DPM;  Location: [facility];  Service: Podiatry;  Laterality: Right;   HAMMER TOE SURGERY Right 02/12/2020   Procedure: SECOND AND THIRD HAMMER TOE CORRECTION; CAPSULOTOMY SECOND INTERPHALANGEAL JOINT;  Surgeon: Tobie Franky SQUIBB, DPM;  Location: [facility];  Service: Podiatry;  Laterality: Right;   JOINT REPLACEMENT     bil knee    KNEE ARTHROSCOPY Left    KNEE ARTHROSCOPY W/ ACL RECONSTRUCTION Right yrs ago   added pins   LAPAROSCOPIC CHOLECYSTECTOMY  ~ 2001   ROUX-EN-Y GASTRIC BYPASS  11/20/2010   Haivana Nakya   SPINAL CORD STIMULATOR INSERTION N/A 04/18/2017   Procedure: LUMBAR SPINAL CORD STIMULATOR INSERTION;  Surgeon: Mindi Mt, MD;  Location: [facility];  Service: Neurosurgery;  Laterality: N/A;  LUMBAR SPINAL CORD STIMULATOR INSERTION   stomach stent  04/28/2020   TENDON TRANSFER Right 08/29/2017   Procedure: right abductor pollicis longus transfer;  Surgeon: Murrell Kuba, MD;  Location: [facility];  Service: Orthopedics;  Laterality: Right;   TOTAL KNEE ARTHROPLASTY Left 03/23/2015   Procedure: TOTAL KNEE ARTHROPLASTY;  Surgeon: Jerona Harden GAILS, MD;  Location: [facility];  Service: Orthopedics;  Laterality: Left;   TOTAL KNEE ARTHROPLASTY Right 08/15/2016   Procedure: RIGHT TOTAL KNEE ARTHROPLASTY, REMOVAL ACL SCREWS;  Surgeon: Harden Jerona GAILS, MD;  Location: [facility];  Service: Orthopedics;  Laterality: Right;   TOTAL KNEE  ARTHROPLASTY WITH HARDWARE REMOVAL Right    UPPER GI ENDOSCOPY  12/06/2022   VAGINAL HYSTERECTOMY     tah/bso   VARICOSE VEIN SURGERY Right X 2   WEIL OSTEOTOMY Right 02/12/2020   Procedure: DOUBLE L OSTEOTOMY;  Surgeon: Tobie Franky SQUIBB, DPM;  Location: [facility];  Service: Podiatry;  Laterality: Right;   Patient Active Problem List   Diagnosis Date Noted   Nausea without vomiting 07/02/2023   Encounter for screening colonoscopy 07/02/2023   Belching 03/12/2023   History of seizures 07/10/2022   Melena 05/15/2022   PVC's (premature ventricular contractions) 10/11/2021   Factor V Leiden mutation (HCC) 10/11/2021   Other secondary scoliosis, lumbar region    Other spondylosis with radiculopathy, lumbar region  Spondylolisthesis, lumbar region    Thoracic spondylosis with radiculopathy    Radiculopathy, lumbar region    Breakdown (mechanical) of implanted electronic neurostimulator of spinal cord electrode (lead), sequela    Degenerative disc disease, lumbar    At risk for adverse drug event 08/31/2021   Acute on chronic blood loss anemia 08/28/2021   Drug-induced constipation 08/28/2021   GERD without esophagitis 08/28/2021   Overactive bladder 08/28/2021   Chronic migraine without aura 08/28/2021   Hx of blood clots 08/28/2021   Major neurocognitive disorder (HCC) 08/28/2021   Status post lumbar spinal fusion 08/21/2021   Weight gain 07/13/2021   Encounter for removal of internal fixation device 10/21/2020   LFTs abnormal 03/02/2020   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   Gastroesophageal reflux disease 09/22/2019   SSBE (short-segment Barrett's esophagus) 09/22/2019   Flatulence 09/22/2019   Migraine 11/11/2018   Abdominal pain, epigastric 10/14/2018   LUQ pain 10/14/2018   Attention deficit hyperactivity disorder (ADHD) 01/12/2018   Insomnia 01/12/2018   Elevated liver enzymes 09/10/2017   History of diabetes mellitus  06/06/2017   Primary osteoarthritis of right hand 06/06/2017   Transaminasemia    TIA (transient ischemic attack) 05/01/2017   Dysphasia 05/01/2017   Chronic pain syndrome 05/01/2017   Confusion    Presence of right artificial knee joint 09/17/2016   H/O total knee replacement, right 08/15/2016   Presence of retained hardware    Memory disorder 08/14/2016   Cerebrovascular disease 08/14/2016   Unilateral primary osteoarthritis, right knee 05/29/2016   Primary osteoarthritis of both feet 05/29/2016   Trochanteric bursitis of both hips 05/25/2016   Neck pain 05/25/2016   Urinary urgency 04/19/2016   Chronic low back pain 01/11/2016   Total knee replacement status 03/23/2015   Heart palpitations 12/14/2013   Generalized osteoarthritis of multiple sites 11/04/2013   Elevated LFTs 03/17/2012   Cerebral infarction, unspecified (HCC) 11/29/2011   Cerebral infarction (HCC) 10/30/2011   PFO (patent foramen ovale) 10/30/2011   Acquired hypothyroidism    Thrombophlebitis    OSA on CPAP    Fibromyalgia    Status post bariatric surgery 12/07/2010   Vein disorder 11/29/2010   S/P total hysterectomy and bilateral salpingo-oophorectomy 11/29/2010   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 Pt states pain in back during ambulation is about a 5/10.   Eval:  Pt reports mid to low back pain. Pt states she had back surgery and states the pain has gotten worse over the past few months. Pt states when she is up moving around she tends to slouched forward. Pt states she needs strengthening in her back muscles to avoid bending forward. Pt states she has been using SPC for years even before back surgery  PERTINENT HISTORY:  -Pt has had several strokes last one in 2014, right foot drags, memory issues -Diabetes, controlled with diet -Neuropathy -Arthritis -Fibromyalgia   PAIN:  Are you having pain? Yes: NPRS scale: 4/10 medicated now, worst in last week 8/10 Pain location: low and mid back pain Pain description: ache, dull and sharp on occasion Aggravating factors: standing up cooking, walking, sitting too long, Relieving factors: bending forward, extension with self pressure to low back, massage, heat, medications, lays on left side  PRECAUTIONS: None  RED FLAGS: Bowel or bladder incontinence: Yes: years   WEIGHT BEARING RESTRICTIONS: No  FALLS:  Has patient fallen in last 6 months? Yes. Number of falls 1, just loss of balance  OCCUPATION: disability  PLOF: Independent with household mobility with device, Requires assistive device for independence, and Needs assistance with homemaking, pt lives with mother, and husband  PATIENT GOALS: strengthen low/mid back, increase activity tolerance, improve walking and balance  NEXT MD VISIT: after therapy  OBJECTIVE:  Note: Objective measures were completed at Evaluation unless otherwise noted.  DIAGNOSTIC FINDINGS:  XRs of the thoracic spine from 06/24/2023 were independently reviewed and  interpreted, showing posterior instrumentation to T10. Several of the  screws appear to breach the superior endplate of their respective  vertebra. The T10 screws have lucency  around them. There is kyphosis above  her fusion that measures 14 degrees. Her films immediately after surgery  measure about 12 degrees of kyphosis (Glattes). No fracture or dislocation  seen.  PATIENT SURVEYS:  08/22/23:  Modified Oswestry Low Back Pain Disability Questionnaire: 25 / 50 = 50.0  09/05/23:  Modified Oswestry Low Back Pain Disability Questionnaire: 23 / 50 = 46.0 %  COGNITION: Overall cognitive status: memory issues     SENSATION: Light touch: Impaired , just feet, sensitivity to cold  POSTURE: rounded shoulders, forward head, decreased lumbar lordosis, increased thoracic kyphosis, and flexed trunk   PALPATION: Pt demonstrates tenderness to palpation of spinous processes of T5-T6 and L4-L5. Pt also demonstrates increased tone on right side of thoracic and lumbar spine.  THORACOLUMBAR ROM:   AROM eval  Flexion   Extension   Right lateral flexion 30 degrees, Pain, worse  Left lateral flexion 35 degrees, pain  Right rotation   Left rotation    (Blank rows = not tested)  LOWER EXTREMITY ROM:     Active  Right eval Left eval  Hip flexion    Hip extension    Hip abduction    Hip adduction    Hip internal rotation    Hip external rotation    Knee flexion    Knee extension    Ankle dorsiflexion    Ankle plantarflexion    Ankle inversion    Ankle eversion     (Blank rows = not tested)  LOWER EXTREMITY MMT:    MMT Right eval Left eval Right 09/05/23 Left 09/05/23  Hip  flexion 4 3 4+ 4  Hip extension 4 4 4 4   Hip abduction 4 4 4 4   Hip adduction 4 4    Hip internal rotation      Hip external rotation      Knee flexion 4 4 4+ 4+  Knee extension 4 4 4+ 4+  Ankle dorsiflexion 4 4 4+ 4+  Ankle plantarflexion      Ankle inversion      Ankle eversion       (Blank rows = not tested)    FUNCTIONAL TESTS:  Eval: 5 times sit to stand: 9.01 seconds 2 minute walk test: 351 feet 09/05/23: 466ft with SPC 5STS 6.88  GAIT: Distance walked: 400  feet Assistive device utilized: Single point cane and None Level of assistance: Modified independence Comments: increased flexion of trunk as test progressed  TREATMENT DATE:  09/17/2023  Therapeutic Exercise: -Treadmill, 5 minutes, grade 2.0, speed 1.6>2.0>2.2>2.4>2.2>2.0, pt cued for proper distance from monitor and upright posture -Paloff Press, 1 sets of 10 reps, bilaterally, RTB, pt cued for decreased rotation during shoulder flexion phase -Lat pull downs, plate 4 and plate 5, 2 sets of 10 reps, pt cued for contraction to be felt all the way through scapula and mid back -Supine bridge with LEs elevated on green exercise ball, 5 second holds, 2 sets of 8 reps, pt cued for UE placement for balance -Childs pose to prone press ups, 1 set of 10 reps, pt cued for hand placement and 5 second hold each rep Neuromuscular Re-education: -Side planks, 1 set of 3 reps, 17 second holds bilaterally, pt cued for knee hip shoulder alignment, and decreased opposite UE support -Modified crunch, on mat table, 2 set of 10 reps, 3 second holds, pt cued for proper sequencing Therapeutic Activity: -Lateral step up and overs, 2 sets of 5 reps, first set with 6 inch step, second set with 8 inch step, increased knee pain reported especially on RLE -Sit to stands with tidal tank sphere push out at chest level, 2 sets of 8 reps, pt cued for core activation  09/12/2023  Therapeutic Exercise: -Treadmill, 5 minutes, grade 0.0, speed 1.4>1.8>2.0>2.2>1.8, pt cued for proper distance from monitor and upright posture -Paloff Press, 2 sets of 10 reps, bilaterally, RTB, pt cued for decreased rotation during shoulder flexion phase -Lat pull downs, plate 3 and plate 4, 2 sets of 10 reps, pt cued for contraction to be felt all the way through scapula and mid back -Cat cow stretch, 1 set of 10 reps, pt cued for hands underneath shoulders -Childs pose to prone press ups, 1 set of 10 reps, pt cued for hand placement and 5 second  hold each rep Neuromuscular Re-education: -Side bridges, 1 set of 2 reps, 15 second holds bilaterally, pt cued for knee hip shoulder alignment -Dead bugs, on mat table, 2 set of 10 reps, pt cued for proper sequencing -STS with tidal tank sphere for core activation from lowered mat table, 2 sets of 8 reps, pt cued to scoot to edge of seat to avoid pushing off of LE   09/10/2023  Therapeutic Exercise: -Nustep, 5 minutes, level 3 resistance, pt avgs about 60 SPM, pt cued for proper extremity placement -Standing shoulder rows, GTB at chest height, 2 sets of 10 reps -Seated lumbar flexion blue theraball rollout, 2 sets of 10 reps, pt cued for increased force through UE for decreased pressure on back, second set side to side added, pt requested shorter chair next session -  Standing shoulder extensions, 2 sets of 10 reps, GTB at chest height Neuromuscular Re-education: -Resisted walking with 4lb ankle weights, lateral stepping, marching,  4 laps on 10 foot black line, pt only able to complete 3 laps -Supine bridges 2 sets of 10 reps, 3 second holds, blue theraband at knees, pt cued for max hip extension -Dead bugs, on mat table, 1 set of 8 reps, pt cued for proper sequencing      PATIENT EDUCATION:  Education details: Pt was educated on findings of PT evaluation, prognosis, frequency of therapy visits and rationale, attendance policy, and HEP if given.   Person educated: Patient Education method: Explanation, Verbal cues, and Handouts Education comprehension: verbalized understanding, verbal cues required, and needs further education  HOME EXERCISE PROGRAM: Access Code: [email or web address] URL: [email or web address] Date: 07/23/2023 Prepared by: Lang Ada  Exercises - Supine Bridge  - 1 x daily - 7 x weekly - 3 sets - 10 reps - Supine Lower Trunk Rotation  - 1 x daily - 7 x weekly - 3 sets - 10 reps - Seated Scapular Retraction  - 1 x daily - 7 x weekly - 3 sets - 10  reps  08/22/23: Decompression  Access Code: [email or web address] URL: [email or web address] Date: 08/29/2023 - Seated Long Arc Quad  - 1 x daily - 7 x weekly - 3 sets - 10 reps - 5 sec hold - Active Straight Leg Raise with Quad Set  - 1 x daily - 7 x weekly - 3 sets - 10 reps - Sidelying Hip Abduction  - 1 x daily - 7 x weekly - 3 sets - 10 reps - Sidelying Hip Adduction  - 1 x daily - 7 x weekly - 3 sets - 10 reps - Prone Heel Squeeze  - 1 x daily - 7 x weekly - 3 sets - 10 reps - 5 sec hold - Prone Hip Extension  - 1 x daily - 7 x weekly - 3 sets - 10 reps 09/10/23: Access Code: [email or web address] URL: [email or web address] Date: 09/10/2023 Prepared by: Lang Ada  Exercises - Supine Dead Bug with Leg Extension  - 1 x daily - 7 x weekly - 3 sets - 10 reps  ASSESSMENT:  CLINICAL IMPRESSION: Patient demonstrates decreased low back, decreased LE/core strength, decreased endurance, decreased gait quality and balance. Patient also demonstrates improved endurance with less need for multiple rest breaks again during today's session. Patient able to progress dynamic balance and core activation exercises today with increased intensity with side planks and lat pull downs, good performance with verbal cueing. Pt demonstrates good motivation with treadmill training with minimal increase in back pain reported. Patient would continue to benefit from skilled physical therapy for decreased low back pain, increased endurance with ambulation, increased LE/core strength, and improved balance for improved quality of life, improved independence with gait training and continued progress towards therapy goals.   Eval:  Patient is a 63 y.o. female who was seen today for physical therapy evaluation and treatment for M54.9 (ICD-10-CM) - Mid back pain. Patient demonstrates decreased core/LE strength, abnormal pain rating in thoracic and lumbar spine, abnormal gait, and impaired balance. Patient also  demonstrates difficulty with ambulation during today's session with decreased stride length and velocity noted. Patient also demonstrates ability to complete the without an AD although gait deviations became more apparent at test progressed. Patient requires education on role of PT, prognosis, HEP and compliance. Patient would benefit from skilled physical therapy for increased endurance with ambulation, increased core/LE strength,  and balance for improved gait quality, return to higher level of function with ADLs, and progress towards therapy goals.   OBJECTIVE IMPAIRMENTS: Abnormal gait, decreased activity tolerance, decreased balance, decreased endurance, decreased mobility, difficulty walking, decreased ROM, decreased strength, postural dysfunction, and pain.   ACTIVITY LIMITATIONS: carrying, lifting, squatting, transfers, bed mobility, continence, and locomotion level  PARTICIPATION LIMITATIONS: meal prep, cleaning, laundry, shopping, community activity, and yard work  PERSONAL FACTORS: Fitness, Past/current experiences, Time since onset of injury/illness/exacerbation, and 1-2 comorbidities: hx of back surgery, diabetes are also affecting patient's functional outcome.   REHAB POTENTIAL: Fair chronic in nature  CLINICAL DECISION MAKING: Evolving/moderate complexity  EVALUATION COMPLEXITY: Moderate   GOALS: Goals reviewed with patient? Yes  SHORT TERM GOALS: Target date: 07/22/225  Pt will be independent with HEP in order to demonstrate participation in Physical Therapy POC.  Baseline:  09/05/23:  Reports compliance with HEP 2-3 times a week Goal status: IN PROGRESS  2.  Pt will report 3/10 pain at worst with mobility in order to demonstrate improved pain with ADLs.  Baseline: 09/05/23:  average pain scale 3-4/10 Goal status: IN PROGRESS  LONG TERM GOALS: Target date: 09/03/23  Pt will improve 5TSTS by at least 2.3 seconds in order to demonstrate improved functional strength to  return to desired activities.  Baseline: see objective.  Goal status: MET  2.  Pt will improve 2 MWT by 50 feet without AD in order to demonstrate improved functional ambulatory capacity in community setting.  Baseline: see objective. ;09/05/23:  414ft with SPC Goal status: IN PROGRESS  3.  Pt will improve Modified Oswestry score by at least 6 points in order to demonstrate improved pain with functional goals and outcomes. Baseline: see objective. ;  Modified Oswestry Low Back Pain Disability Questionnaire: 23 / 50 = 46.0 % Goal status: IN PROGRESS  4.  Pt will report 1/10 pain at worst with mobility in order to demonstrate reduced pain with ADLs lasting greater than 30 minutes.  Baseline: see objective.  Goal status: IN PROGRESS   PLAN:  PT FREQUENCY: 1-2x/week  PT DURATION: 4 weeks  PLANNED INTERVENTIONS: 97110-Therapeutic exercises, 97530- Therapeutic activity, [id number]- Neuromuscular re-education, 97535- Self Care, [id number]- Manual therapy, [phone]- Gait training, Patient/Family education, Balance training, Stair training, Spinal mobilization, DME instructions, Cryotherapy, and Moist heat.  PLAN FOR NEXT SESSION: Progress lumbar and thoracic spine ROM and strengthening of paraspinals, progress gait/balance training.  Continue postural strengthening with band next session, give as HEP when good form.   Lang Ada, PT, DPT [facility] Office: [phone] 3:17 PM, 09/17/23

## 2023-09-17 NOTE — Progress Notes (Unsigned)
 Cardiology Office Note    Date:  09/20/2023  ID:  JACE DOWE, DOB 06-09-1960, MRN [id number] PCP:  Leonce Lucie PARAS, PA-C  Cardiologist:  Maude Emmer, MD  Electrophysiologist:  None   Chief Complaint: Preoperative cardiac evaluation   History of Present Illness: .    Debbie Pineda is a 63 y.o. female with visit-pertinent history of factor V Leyden mutation, CVA, patent foramen ovale noted on echocardiogram at [facility] in 2013, fibromyalgia, GERD, hypothyroidism, iron  deficiency anemia, OSA, prediabetes.  Patient was seen in 09/2021 for evaluation of irregular heartbeat.  She reported that she had not had irregularity on pulse oximetry and BP checks over the prior month.  She denied any rapid palpitations, did note some palpitations during PT.  ZIO monitor and echocardiogram was recommended.  ZIO monitor indicated an average heart rate of 83 bpm ranging from 56 228 bpm.  Predominant underlying rhythm was sinus rhythm.  She had occasional PVCs at 1.8% burden, ventricular bigeminy and trigeminy were present.  Patient was last seen in clinic on 01/17/2022, reported some exertional dyspnea but had not been active following back surgery nuclear stress test on 02/02/2022 showed no ST deviation during pharmacological stress, LV perfusion was normal with no evidence of ischemia or infarction, study was normal and low risk.  Echocardiogram completed at [facility] on 08/09/2023 indicated LVEF greater than 55%, LV with normal wall thickness and size, diastolic function was normal, RV normal in size and function, LA and RA were normal in size, no significant aortic valve stenosis or regurgitation, no mitral valve regurgitation.  Aorta was normal in size.  Today she presents for preoperative cardiac evaluation for revision of gastrojejunal anastomosis with [facility] [employer] on 09/25/2023.  Today she reports that she has been doing well overall.  She denies any chest pain, significant shortness of breath,  lower extremity edema, orthopnea or PND.  She denies any palpitations, presyncope or syncope.  Patient notes that she has history of fibromyalgia and strokes, reports that her mobility is somewhat limited however she is able to achieve greater than 4 METS of activity.  Labwork independently reviewed: 06/04/2023: Hemoglobin 13.7, hematocrit 42.5, sodium 145, potassium 4.6, creatinine 0.93 ROS: .   Today she denies chest pain, shortness of breath, lower extremity edema, fatigue, palpitations, melena, hematuria, hemoptysis, diaphoresis, weakness, presyncope, syncope, orthopnea, and PND.  All other systems are reviewed and otherwise negative. Studies Reviewed: SABRA   EKG:  EKG is ordered today, personally reviewed, demonstrating  EKG Interpretation Date/Time:  Friday September 20 2023 13:29:32 EDT Ventricular Rate:  76 PR Interval:  168 QRS Duration:  90 QT Interval:  370 QTC Calculation: 416 R Axis:   -17  Text Interpretation: Normal sinus rhythm Cannot rule out Anterior infarct , age undetermined When compared with ECG of 06-Sep-2021 11:30, No significant change since last tracing Confirmed by Domenique Quest [phone]) on 09/20/2023 1:51:52 PM   CV Studies: Cardiac studies reviewed are outlined and summarized above. Otherwise please see EMR for full report. Cardiac Studies & Procedures   ______________________________________________________________________________________________   STRESS TESTS  NM MYOCAR MULTI W/SPECT W 02/02/2022  Narrative   No ST deviation was noted during pharmacological stress.   LV perfusion is normal. There is no evidence of ischemia. There is no evidence of infarction.   Nuclear stress EF: 73 %. The left ventricular ejection fraction is hyperdynamic (>65%).   The study is normal. The study is low risk.   ECHOCARDIOGRAM  ECHOCARDIOGRAM COMPLETE BUBBLE STUDY  01/18/2020  Narrative ECHOCARDIOGRAM REPORT    Patient Name:   Debbie Pineda Date of Exam:  01/18/2020 Medical Rec #:  [id number]         Height:       65.0 in Accession #:    [id number]        Weight:       206.6 lb Date of Birth:  Jan 14, 1961          BSA:          2.006 m Patient Age:    59 years          BP:           121/77 mmHg Patient Gender: F                 HR:           56 bpm. Exam Location:  [facility]  Procedure: 2D Echo and Saline Contrast Bubble Study  Indications:    PFO (patent formamen ovale) 745.5 / Q21.1  History:        Patient has prior history of Echocardiogram examinations. Stroke and TIA, Signs/Symptoms:Chest Pain; Risk Factors:Former Smoker. PFO, Bradycardia, GERD.  Sonographer:    Orvil Holmes RDCS (AE) Referring Phys: [id number] MAUDE JAYSON EMMER  IMPRESSIONS   1. Left ventricular ejection fraction, by estimation, is 70 to 75%. The left ventricle has hyperdynamic function. The left ventricle has no regional wall motion abnormalities. Left ventricular diastolic parameters were normal. 2. Evidence of atrial level shunting detected by color flow Doppler. Agitated saline contrast bubble study was positive with shunting observed within 3-6 cardiac cycles suggestive of interatrial shunt. There is a moderately sized patent foramen ovale. 3. Right ventricular systolic function is normal. The right ventricular size is normal. 4. The mitral valve is normal in structure. Mild mitral valve regurgitation. 5. The aortic valve is abnormal. Aortic valve regurgitation is not visualized. Mild aortic valve sclerosis is present, with no evidence of aortic valve stenosis. 6. The inferior vena cava is normal in size with greater than 50% respiratory variability, suggesting right atrial pressure of 3 mmHg.  Comparison(s): The left ventricular function is unchanged.  FINDINGS Left Ventricle: Left ventricular ejection fraction, by estimation, is 70 to 75%. The left ventricle has hyperdynamic function. The left ventricle has no regional wall motion abnormalities. The left  ventricular internal cavity size was normal in size. There is no left ventricular hypertrophy. Left ventricular diastolic parameters were normal.  Right Ventricle: The right ventricular size is normal. Right vetricular wall thickness was not assessed. Right ventricular systolic function is normal.  Left Atrium: Left atrial size was normal in size.  Right Atrium: Right atrial size was normal in size.  Pericardium: There is no evidence of pericardial effusion.  Mitral Valve: The mitral valve is normal in structure. Mild mitral valve regurgitation.  Tricuspid Valve: The tricuspid valve is normal in structure. Tricuspid valve regurgitation is trivial.  Aortic Valve: The aortic valve is abnormal. Aortic valve regurgitation is not visualized. Mild aortic valve sclerosis is present, with no evidence of aortic valve stenosis.  Pulmonic Valve: The pulmonic valve was normal in structure. Pulmonic valve regurgitation is not visualized.  Aorta: The aortic root and ascending aorta are structurally normal, with no evidence of dilitation.  Venous: The inferior vena cava is normal in size with greater than 50% respiratory variability, suggesting right atrial pressure of 3 mmHg.  IAS/Shunts: Evidence of atrial level shunting detected by color flow Doppler. Agitated saline  contrast was given intravenously to evaluate for intracardiac shunting. Agitated saline contrast bubble study was positive with shunting observed within 3-6 cardiac cycles suggestive of interatrial shunt. A moderately sized patent foramen ovale is detected.   LEFT VENTRICLE PLAX 2D LVIDd:         4.26 cm  Diastology LVIDs:         2.55 cm  LV e' medial:    9.79 cm/s LV PW:         1.00 cm  LV E/e' medial:  7.2 LV IVS:        1.13 cm  LV e' lateral:   10.30 cm/s LVOT diam:     2.10 cm  LV E/e' lateral: 6.8 LVOT Area:     3.46 cm   RIGHT VENTRICLE RV S prime:     10.90 cm/s TAPSE (M-mode): 2.4 cm  LEFT ATRIUM              Index       RIGHT ATRIUM           Index LA diam:        3.60 cm 1.79 cm/m  RA Area:     14.30 cm LA Vol (A2C):   52.1 ml 25.98 ml/m RA Volume:   36.50 ml  18.20 ml/m LA Vol (A4C):   42.7 ml 21.29 ml/m LA Biplane Vol: 49.0 ml 24.43 ml/m  AORTA Ao Root diam: 3.40 cm  MITRAL VALVE               TRICUSPID VALVE MV Area (PHT): 3.08 cm    TR Peak grad:   17.6 mmHg MV Decel Time: 246 msec    TR Vmax:        210.00 cm/s MV E velocity: 70.30 cm/s MV A velocity: 45.40 cm/s  SHUNTS MV E/A ratio:  1.55        Systemic Diam: 2.10 cm  Vina Gull MD Electronically signed by Vina Gull MD Signature Date/Time: 01/18/2020/3:34:20 PM    Final    MONITORS  LONG TERM MONITOR (3-14 DAYS) 10/19/2021  Narrative Patch Wear Time:  3 days and 0 hours (2023-09-22T22:31:54-0400 to 2023-09-25T22:43:09-398)  Patient had a min HR of 56 bpm, max HR of 128 bpm, and avg HR of 83 bpm. Predominant underlying rhythm was Sinus Rhythm. Isolated SVEs were rare (<1.0%), SVE Couplets were rare (<1.0%), and SVE Triplets were rare (<1.0%). Isolated VEs were occasional (1.8%, 6223), VE Couplets were rare (<1.0%, 1), and no VE Triplets were present. Ventricular Bigeminy and Trigeminy were present.  Maude Emmer MD [facility]       ______________________________________________________________________________________________       Current Reported Medications:.    No outpatient medications have been marked as taking for the 09/20/23 encounter (Office Visit) with Destiney Sanabia D, NP.    Physical Exam:    VS:  BP 110/68   Pulse 76   Ht 5' 5 (1.651 m)   Wt 239 lb 9.6 oz (108.7 kg)   SpO2 96%   BMI 39.87 kg/m    Wt Readings from Last 3 Encounters:  09/20/23 239 lb 9.6 oz (108.7 kg)  07/03/23 248 lb 9.6 oz (112.8 kg)  07/02/23 249 lb 4.8 oz (113.1 kg)    GEN: Well nourished, well developed in no acute distress NECK: No JVD; No carotid bruits CARDIAC: RRR, no murmurs, rubs, gallops RESPIRATORY:  Clear to  auscultation without rales, wheezing or rhonchi  ABDOMEN: Soft, non-tender, non-distended EXTREMITIES:  No edema; No  acute deformity     Asessement and Plan:.    Preoperative cardiac evaluation: Patient to undergo revision of gastro jejunal anastomosis on 09/25/2023. Ms. Campos's perioperative risk of a major cardiac event is 6.6% according to the Revised Cardiac Risk Index (RCRI).   Her functional capacity is good at 6.61 METs according to the [facility] Activity Status Index (DASI). Recommendations:According to ACC/AHA guidelines, no further cardiovascular testing needed.  The patient may proceed to surgery at acceptable risk.  Antiplatelet and/or Anticoagulation Recommendations: Pt on Xarelto  for hx of Factor V Leiden deficiency and hx of PFO/stroke. Cardiology has not been managing anticoagulation, clearance recs should come from managing provider.   PFO: Moderately sized PFO noted on echocardiogram on 01/18/2020.  Patient is on chronic anticoagulation and has not had any further stroke symptoms, therefore she has not been referred for closure.  Reviewed with Dr. Kate, DOD at [facility] office today.  There is no indication for any further testing at this time for patient's preoperative cardiac evaluation, as we are not managing her anticoagulation recommendations for holding will need to come from prescribing provider.  On chart review patient has previously held her Xarelto  for procedures without problems.  Factor V Leiden mutation: Patient is on chronic anticoagulation with Xarelto  20 mg once daily, monitored and managed per PCP.  Palpitations: She reports that her palpitations have resolved.  She will notify the office if she has any recurrence.    Disposition: F/u with Dr. Nishan in 1 year or sooner if needed.  Signed, Kayton Dunaj D Tammy Wickliffe, NP

## 2023-09-18 ENCOUNTER — Ambulatory Visit: Admitting: Orthopedic Surgery

## 2023-09-18 DIAGNOSIS — G8929 Other chronic pain: Secondary | ICD-10-CM | POA: Diagnosis not present

## 2023-09-18 DIAGNOSIS — M546 Pain in thoracic spine: Secondary | ICD-10-CM | POA: Diagnosis not present

## 2023-09-18 NOTE — Progress Notes (Signed)
 Orthopedic Spine Surgery Office Note   Assessment: Patient is a 63 y.o. female who underwent T10-S1 PSIF and multi-level TLIF with my partner on 07/2021 who comes in today for follow up on her right SI joint pain and mid thoracic back pain     Plan: -Patient has tried activity modification, pain management, right SI joint injections, PT -She has gotten good relief with SI joint injections in the past so could consider that again as a treatment option if SI pain becomes more symptomatic -Continue with PT, when she is done I told her to continue with the home exercise program about 3 times per week -Weight goal of 235 pounds before any elective surgery -Patient should return to office in 6 weeks, x-rays at next visit: none     Patient expressed understanding of the plan and all questions were answered to the patient's satisfaction.    ___________________________________________________________________________   History: Patient is a 63 y.o. female who has been previously seen in the office for routine post-operative follow up after my partner did a T10-S1 PSIF with multi-level TLIFs.  Patient has noticed improvement in her symptoms since she was last seen in the office.  She has been working with physical therapy and doing home exercises.  She said that the pain is tolerable.  She feels that her leg strength has been improving as well.  She has no complaints at this time.  Previous treatments: pain management, activity modification, right SI injections, PT     Physical Exam:   General: no acute distress, appears stated age, walking with cane Neurologic: alert, answering questions appropriately, following commands Respiratory: unlabored breathing on room air, symmetric chest rise Psychiatric: appropriate affect, normal cadence to speech     MSK (spine):   -Strength exam                                                   Left                  Right EHL                              5/5                   -/5 TA                                 5/5                  5/5 GSC                             5/5                  5/5 Knee extension            5/5                  5/5 Hip flexion                    5/5                  5/5   Had MTP  fusion on the right    -Sensory exam                           Sensation intact to light touch in L3-S1 nerve distributions of bilateral lower extremities   -Straight leg raise: negative -Clonus: no beats bilaterally   Imaging: XRs of the thoracic spine from 06/24/2023 were previously independently reviewed and interpreted, showing posterior instrumentation to T10. Several of the screws appear to breach the superior endplate of their respective vertebra. The T10 screws have lucency around them. There is kyphosis above her fusion that measures 14 degrees. Her films immediately after surgery measure about 12 degrees of kyphosis (Glattes). No fracture or dislocation seen.      Patient name: Debbie Pineda Patient MRN: [id number] Date of visit: 09/18/23

## 2023-09-19 ENCOUNTER — Encounter (HOSPITAL_COMMUNITY): Payer: Self-pay

## 2023-09-19 ENCOUNTER — Ambulatory Visit (HOSPITAL_COMMUNITY)

## 2023-09-19 DIAGNOSIS — M546 Pain in thoracic spine: Secondary | ICD-10-CM

## 2023-09-19 DIAGNOSIS — M5459 Other low back pain: Secondary | ICD-10-CM

## 2023-09-19 DIAGNOSIS — Z7409 Other reduced mobility: Secondary | ICD-10-CM

## 2023-09-19 NOTE — Therapy (Signed)
 OUTPATIENT PHYSICAL THERAPY THORACOLUMBAR TREATMENT   Patient Name: Debbie Pineda MRN: [id number] DOB:1960-06-08, 63 y.o., female Today's Date: 09/19/2023  END OF SESSION:  PT End of Session - 09/19/23 1439     Visit Number 8    Number of Visits 12    Date for PT Re-Evaluation 10/04/23    Authorization Type [employer]    Authorization Time Period no auth required    PT Start Time 1439   late arrival   PT Stop Time 1510    PT Time Calculation (min) 31 min    Activity Tolerance Patient tolerated treatment well    Behavior During Therapy WFL for tasks assessed/performed               Past Medical History:  Diagnosis Date   ADD (attention deficit disorder)    Arthritis    all over   Cerebral infarction (HCC) 10/30/2011   Cerebrovascular disease 08/14/2016   Chronic back pain    all over   Chronic low back pain 01/11/2016   Complication of anesthesia    tends to have hypotension when NPO and post-anesthesia   Constipation    takes stool softener daily   Degenerative disk disease    Degenerative joint disease    DVT (deep venous thrombosis) (HCC) 2014   RLE   Family history of adverse reaction to anesthesia    a family member woke up during surgery; think it was my mom   Fibromyalgia    Generalized osteoarthritis of multiple sites 11/04/2013   GERD (gastroesophageal reflux disease)    Headache    Heart palpitations 12/14/2013   resolved   History of blood clots    superficial   Hypoglycemia    Hypothyroid    takes Synthroid  daily   Incomplete emptying of bladder    Insomnia    takes Trazodone  nightly   Iron  deficiency anemia    takes Ferrous Sulfate  daily   Joint pain    Joint swelling    knees and ankles   Memory disorder 08/14/2016   Morbid obesity (HCC)    Nausea    takes Zofran  as needed.Seeing GI doc   Neck pain 05/25/2016   OSA on CPAP    tested more than 5 yrs ago.     Osteoarthritis    Osteopenia    in feet   PFO  (patent foramen ovale)    no murmur   PFO (patent foramen ovale)    Pre-diabetes    Primary osteoarthritis of both feet 05/29/2016   Right bunionectomy August 2017 by Dr. Harden   PVC's (premature ventricular contractions) 10/11/2021   Monitor 9/23: 1.8% PVCs; rare PACs; o/w NSR   Scoliosis    Skin abnormality 02/04/2020   raised area on lip   Stroke [facility]) severallast 2014   right foot weakness; memory issues, black spot right visual field since (03/23/2015)   Thrombophlebitis    Trochanteric bursitis of both hips 05/25/2016   Unilateral primary osteoarthritis, right knee 05/29/2016   Urinary urgency 04/19/2016   Vein disorder 11/29/2010   varicose veins both legs   Wears glasses    Past Surgical History:  Procedure Laterality Date   BIOPSY  11/14/2018   Procedure: BIOPSY;  Surgeon: Golda Claudis PENNER, MD;  [facility];  Service: Endoscopy;;  esophagusgastric   BIOPSY  06/07/2022   Procedure: BIOPSY;  Surgeon: Eartha Angelia Sieving, MD;  [facility]: [facility];  Service: Gastroenterology;;   BONE EXCISION  Right 08/29/2017   Procedure: right trapezium excision;  Surgeon: Murrell Kuba, MD;  Location: [facility];  Service: Orthopedics;  Laterality: Right;   BUNIONECTOMY Right 08/2015   CARDIAC CATHETERIZATION     2008.  it was fine (not sure why she had it done, and doesn't know where)   CARPOMETACARPEL SUSPENSION PLASTY Right 08/29/2017   Procedure: SUSPENSION PLASTY RIGHT THUMB;  Surgeon: Murrell Kuba, MD;  Location: [facility];  Service: Orthopedics;  Laterality: Right;   COLONOSCOPY N/A 03/25/2013   Procedure: COLONOSCOPY;  Surgeon: Claudis RAYMOND Rivet, MD;  [facility];  Service: Endoscopy;  Laterality: N/A;  930   ERCP  06/02/2020   ESOPHAGOGASTRODUODENOSCOPY     ESOPHAGOGASTRODUODENOSCOPY (EGD) WITH PROPOFOL  N/A 11/14/2018   Procedure: ESOPHAGOGASTRODUODENOSCOPY (EGD) WITH PROPOFOL ;  Surgeon: Rivet Claudis RAYMOND, MD;   [facility];  Service: Endoscopy;  Laterality: N/A;  1:55pm-office moved to 11:00am/pt notified to arrive at 9:30am per KF   ESOPHAGOGASTRODUODENOSCOPY (EGD) WITH PROPOFOL  N/A 06/07/2022   Procedure: ESOPHAGOGASTRODUODENOSCOPY (EGD) WITH PROPOFOL ;  Surgeon: Eartha Angelia Sieving, MD;  [facility];  Service: Gastroenterology;  Laterality: N/A;  2:00pm;asa 3   EXPLORATORY LAPAROTOMY     took fallopian tubes out   HALLUX FUSION Right 02/12/2020   Procedure: HALLUX INTERPHANGEAL JOINT  FUSION;  Surgeon: Tobie Franky SQUIBB, DPM;  Location: [facility];  Service: Podiatry;  Laterality: Right;   HAMMER TOE SURGERY Right 02/12/2020   Procedure: SECOND AND THIRD HAMMER TOE CORRECTION; CAPSULOTOMY SECOND INTERPHALANGEAL JOINT;  Surgeon: Tobie Franky SQUIBB, DPM;  Location: [facility];  Service: Podiatry;  Laterality: Right;   JOINT REPLACEMENT     bil knee    KNEE ARTHROSCOPY Left    KNEE ARTHROSCOPY W/ ACL RECONSTRUCTION Right yrs ago   added pins   LAPAROSCOPIC CHOLECYSTECTOMY  ~ 2001   ROUX-EN-Y GASTRIC BYPASS  11/20/2010   [place]   SPINAL CORD STIMULATOR INSERTION N/A 04/18/2017   Procedure: LUMBAR SPINAL CORD STIMULATOR INSERTION;  Surgeon: Mindi Mt, MD;  Location: [facility];  Service: Neurosurgery;  Laterality: N/A;  LUMBAR SPINAL CORD STIMULATOR INSERTION   stomach stent  04/28/2020   TENDON TRANSFER Right 08/29/2017   Procedure: right abductor pollicis longus transfer;  Surgeon: Murrell Kuba, MD;  Location: [facility];  Service: Orthopedics;  Laterality: Right;   TOTAL KNEE ARTHROPLASTY Left 03/23/2015   Procedure: TOTAL KNEE ARTHROPLASTY;  Surgeon: Jerona Harden GAILS, MD;  Location: [facility];  Service: Orthopedics;  Laterality: Left;   TOTAL KNEE ARTHROPLASTY Right 08/15/2016   Procedure: RIGHT TOTAL KNEE ARTHROPLASTY, REMOVAL ACL SCREWS;  Surgeon: Harden Jerona GAILS, MD;  Location: [facility];  Service: Orthopedics;  Laterality: Right;    TOTAL KNEE ARTHROPLASTY WITH HARDWARE REMOVAL Right    UPPER GI ENDOSCOPY  12/06/2022   VAGINAL HYSTERECTOMY     tah/bso   VARICOSE VEIN SURGERY Right X 2   WEIL OSTEOTOMY Right 02/12/2020   Procedure: DOUBLE L OSTEOTOMY;  Surgeon: Tobie Franky SQUIBB, DPM;  Location: [facility];  Service: Podiatry;  Laterality: Right;   Patient Active Problem List   Diagnosis Date Noted   Nausea without vomiting 07/02/2023   Encounter for screening colonoscopy 07/02/2023   Belching 03/12/2023   History of seizures 07/10/2022   Melena 05/15/2022   PVC's (premature ventricular contractions) 10/11/2021   Factor V Leiden mutation (HCC) 10/11/2021   Other secondary scoliosis, lumbar region    Other spondylosis  with radiculopathy, lumbar region    Spondylolisthesis, lumbar region    Thoracic spondylosis with radiculopathy    Radiculopathy, lumbar region    Breakdown (mechanical) of implanted electronic neurostimulator of spinal cord electrode (lead), sequela    Degenerative disc disease, lumbar    At risk for adverse drug event 08/31/2021   Acute on chronic blood loss anemia 08/28/2021   Drug-induced constipation 08/28/2021   GERD without esophagitis 08/28/2021   Overactive bladder 08/28/2021   Chronic migraine without aura 08/28/2021   Hx of blood clots 08/28/2021   Major neurocognitive disorder (HCC) 08/28/2021   Status post lumbar spinal fusion 08/21/2021   Weight gain 07/13/2021   Encounter for removal of internal fixation device 10/21/2020   LFTs abnormal 03/02/2020   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   Gastroesophageal reflux disease 09/22/2019   SSBE (short-segment Barrett's esophagus) 09/22/2019   Flatulence 09/22/2019   Migraine 11/11/2018   Abdominal pain, epigastric 10/14/2018   LUQ pain 10/14/2018   Attention deficit hyperactivity disorder (ADHD) 01/12/2018   Insomnia 01/12/2018   Elevated liver enzymes 09/10/2017   History of  diabetes mellitus 06/06/2017   Primary osteoarthritis of right hand 06/06/2017   Transaminasemia    TIA (transient ischemic attack) 05/01/2017   Dysphasia 05/01/2017   Chronic pain syndrome 05/01/2017   Confusion    Presence of right artificial knee joint 09/17/2016   H/O total knee replacement, right 08/15/2016   Presence of retained hardware    Memory disorder 08/14/2016   Cerebrovascular disease 08/14/2016   Unilateral primary osteoarthritis, right knee 05/29/2016   Primary osteoarthritis of both feet 05/29/2016   Trochanteric bursitis of both hips 05/25/2016   Neck pain 05/25/2016   Urinary urgency 04/19/2016   Chronic low back pain 01/11/2016   Total knee replacement status 03/23/2015   Heart palpitations 12/14/2013   Generalized osteoarthritis of multiple sites 11/04/2013   Elevated LFTs 03/17/2012   Cerebral infarction, unspecified (HCC) 11/29/2011   Cerebral infarction (HCC) 10/30/2011   PFO (patent foramen ovale) 10/30/2011   Acquired hypothyroidism    Thrombophlebitis    OSA on CPAP    Fibromyalgia    Status post bariatric surgery 12/07/2010   Vein disorder 11/29/2010   S/P total hysterectomy and bilateral salpingo-oophorectomy 11/29/2010   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 Pt states she was able to rest before coming to session today.    Eval:  Pt reports mid to low back pain. Pt states she had back surgery and states the pain has gotten worse over the past few months. Pt states when she is up moving around she tends to slouched forward. Pt states she needs strengthening in her back muscles to avoid bending forward. Pt states she has been using SPC for years even before back surgery  PERTINENT HISTORY:  -Pt has had several strokes last one in 2014, right foot drags, memory issues -Diabetes, controlled with diet -Neuropathy -Arthritis -Fibromyalgia   PAIN:  Are you having pain? Yes: NPRS scale: 4/10 medicated now, worst in last week 8/10 Pain location: low and mid back pain Pain description: ache, dull and sharp on occasion Aggravating factors: standing up cooking, walking, sitting too long, Relieving factors: bending forward, extension with self pressure to low back, massage, heat, medications, lays on left side  PRECAUTIONS: None  RED FLAGS: Bowel or bladder incontinence: Yes: years   WEIGHT BEARING RESTRICTIONS: No  FALLS:  Has patient fallen in last 6 months? Yes. Number of falls 1, just loss of balance  OCCUPATION: disability  PLOF: Independent with household mobility with device, Requires assistive device for independence, and Needs assistance with homemaking, pt lives with mother, and husband  PATIENT GOALS: strengthen low/mid back, increase activity tolerance, improve walking and balance  NEXT MD VISIT: after therapy  OBJECTIVE:  Note: Objective measures were completed at Evaluation unless otherwise noted.  DIAGNOSTIC FINDINGS:  XRs of the thoracic spine from 06/24/2023 were independently reviewed and  interpreted, showing posterior instrumentation to T10. Several of the  screws appear to breach the superior endplate of their respective  vertebra. The T10 screws have lucency around  them. There is kyphosis above  her fusion that measures 14 degrees. Her films immediately after surgery  measure about 12 degrees of kyphosis (Glattes). No fracture or dislocation  seen.  PATIENT SURVEYS:  08/22/23:  Modified Oswestry Low Back Pain Disability Questionnaire: 25 / 50 = 50.0  09/05/23:  Modified Oswestry Low Back Pain Disability Questionnaire: 23 / 50 = 46.0 %  COGNITION: Overall cognitive status: memory issues     SENSATION: Light touch: Impaired , just feet, sensitivity to cold  POSTURE: rounded shoulders, forward head, decreased lumbar lordosis, increased thoracic kyphosis, and flexed trunk   PALPATION: Pt demonstrates tenderness to palpation of spinous processes of T5-T6 and L4-L5. Pt also demonstrates increased tone on right side of thoracic and lumbar spine.  THORACOLUMBAR ROM:   AROM eval  Flexion   Extension   Right lateral flexion 30 degrees, Pain, worse  Left lateral flexion 35 degrees, pain  Right rotation   Left rotation    (Blank rows = not tested)  LOWER EXTREMITY ROM:     Active  Right eval Left eval  Hip flexion    Hip extension    Hip abduction    Hip adduction    Hip internal rotation    Hip external rotation    Knee flexion    Knee extension    Ankle dorsiflexion    Ankle plantarflexion    Ankle inversion    Ankle eversion     (Blank rows = not tested)  LOWER EXTREMITY MMT:    MMT Right eval Left eval Right 09/05/23 Left 09/05/23  Hip flexion 4 3 4+ 4  Hip  extension 4 4 4 4   Hip abduction 4 4 4 4   Hip adduction 4 4    Hip internal rotation      Hip external rotation      Knee flexion 4 4 4+ 4+  Knee extension 4 4 4+ 4+  Ankle dorsiflexion 4 4 4+ 4+  Ankle plantarflexion      Ankle inversion      Ankle eversion       (Blank rows = not tested)    FUNCTIONAL TESTS:  Eval: 5 times sit to stand: 9.01 seconds 2 minute walk test: 351 feet 09/05/23: 462ft with SPC 5STS 6.88  GAIT: Distance walked: 400  feet Assistive device utilized: Single point cane and None Level of assistance: Modified independence Comments: increased flexion of trunk as test progressed  TREATMENT DATE:  09/19/2023  Therapeutic Exercise: -Nustep, 4 minutes, pt cued for 70 SPM -D1 flexions with plate 3 on body craft, RUE better, performance, 1 set of 7 reps, pt cued for foot placement -Lat pull downs, plate 5 and plate 6, 2 sets of 10 reps, pt cued for contraction to be felt all the way through scapula and mid back -Supine bridge with LEs elevated on green exercise ball and hamstring curl, 1 sets of 5 reps, pt cued for UE placement for balance -Childs pose to prone press ups, 1 set of 5 reps, pt cued for hand placement and 5 second hold each rep Therapeutic Activity: -Sled pushes, 40lbs, 50 foot laps, pt cued for upright posture and increased ROM, 3 laps -Sit to stands with tidal tank sphere push out at chest level, 2 sets of 8 reps, pt cued for core activation -Unilateral farmer carries, 10lb kettle bell, pt cued for core contraction and neutral spine, level shoulders  09/17/2023  Therapeutic Exercise: -Treadmill, 5 minutes, grade 2.0, speed 1.6>2.0>2.2>2.4>2.2>2.0, pt cued for proper distance from monitor and upright posture -Paloff Press, 1 sets of 10 reps, bilaterally, RTB, pt cued for decreased rotation during shoulder flexion phase -Lat pull downs, plate 4 and plate 5, 2 sets of 10 reps, pt cued for contraction to be felt all the way through scapula and mid back -Supine bridge with LEs elevated on green exercise ball, 5 second holds, 2 sets of 8 reps, pt cued for UE placement for balance -Karolynn pose to prone press ups, 1 set of 10 reps, pt cued for hand placement and 5 second hold each rep Neuromuscular Re-education: -Side planks, 1 set of 3 reps, 17 second holds bilaterally, pt cued for knee hip shoulder alignment, and decreased opposite UE support -Modified crunch, on mat table, 2 set of 10 reps, 3 second  holds, pt cued for proper sequencing Therapeutic Activity: -Lateral step up and overs, 2 sets of 5 reps, first set with 6 inch step, second set with 8 inch step, increased knee pain reported especially on RLE -Sit to stands with tidal tank sphere push out at chest level, 2 sets of 8 reps, pt cued for core activation  09/12/2023  Therapeutic Exercise: -Treadmill, 5 minutes, grade 0.0, speed 1.4>1.8>2.0>2.2>1.8, pt cued for proper distance from monitor and upright posture -Paloff Press, 2 sets of 10 reps, bilaterally, RTB, pt cued for decreased rotation during shoulder flexion phase -Lat pull downs, plate 3 and plate 4, 2 sets of 10 reps, pt cued for contraction to be felt all the way through scapula and mid back -Cat cow stretch, 1 set of 10 reps, pt cued for hands underneath shoulders -Karolynn  pose to prone press ups, 1 set of 10 reps, pt cued for hand placement and 5 second hold each rep Neuromuscular Re-education: -Side bridges, 1 set of 2 reps, 15 second holds bilaterally, pt cued for knee hip shoulder alignment -Dead bugs, on mat table, 2 set of 10 reps, pt cued for proper sequencing -STS with tidal tank sphere for core activation from lowered mat table, 2 sets of 8 reps, pt cued to scoot to edge of seat to avoid pushing off of LE    PATIENT EDUCATION:  Education details: Pt was educated on findings of PT evaluation, prognosis, frequency of therapy visits and rationale, attendance policy, and HEP if given.   Person educated: Patient Education method: Explanation, Verbal cues, and Handouts Education comprehension: verbalized understanding, verbal cues required, and needs further education  HOME EXERCISE PROGRAM: Access Code: [email or web address] URL: [email or web address] Date: 07/23/2023 Prepared by: Lang Ada  Exercises - Supine Bridge  - 1 x daily - 7 x weekly - 3 sets - 10 reps - Supine Lower Trunk Rotation  - 1 x daily - 7 x weekly - 3 sets - 10 reps - Seated Scapular  Retraction  - 1 x daily - 7 x weekly - 3 sets - 10 reps  08/22/23: Decompression  Access Code: [email or web address] URL: [email or web address] Date: 08/29/2023 - Seated Long Arc Quad  - 1 x daily - 7 x weekly - 3 sets - 10 reps - 5 sec hold - Active Straight Leg Raise with Quad Set  - 1 x daily - 7 x weekly - 3 sets - 10 reps - Sidelying Hip Abduction  - 1 x daily - 7 x weekly - 3 sets - 10 reps - Sidelying Hip Adduction  - 1 x daily - 7 x weekly - 3 sets - 10 reps - Prone Heel Squeeze  - 1 x daily - 7 x weekly - 3 sets - 10 reps - 5 sec hold - Prone Hip Extension  - 1 x daily - 7 x weekly - 3 sets - 10 reps 09/10/23: Access Code: [email or web address] URL: [email or web address] Date: 09/10/2023 Prepared by: Lang Ada  Exercises - Supine Dead Bug with Leg Extension  - 1 x daily - 7 x weekly - 3 sets - 10 reps  ASSESSMENT:  CLINICAL IMPRESSION: Patient demonstrates slightly increased low back pain, decreased LE/core strength, decreased activity tolerance, decreased gait quality and balance. Patient also demonstrates improved endurance with less need for multiple rest breaks again during today's session. Patient able to progress dynamic balance and core activation exercises today with sled push and rotational body craft exercise, good performance with verbal cueing. Pt demonstrates good motivation with treadmill training with minimal increase in back pain reported. Patient would continue to benefit from skilled physical therapy for decreased low back pain, increased endurance with ambulation, increased LE/core strength, and improved balance for improved quality of life, improved independence with gait training and continued progress towards therapy goals.   Eval:  Patient is a 63 y.o. female who was seen today for physical therapy evaluation and treatment for M54.9 (ICD-10-CM) - Mid back pain. Patient demonstrates decreased core/LE strength, abnormal pain rating in thoracic and lumbar  spine, abnormal gait, and impaired balance. Patient also demonstrates difficulty with ambulation during today's session with decreased stride length and velocity noted. Patient also demonstrates ability to complete the without an AD although gait deviations became more apparent at test progressed. Patient requires education on role of PT, prognosis, HEP and compliance. Patient would  benefit from skilled physical therapy for increased endurance with ambulation, increased core/LE strength, and balance for improved gait quality, return to higher level of function with ADLs, and progress towards therapy goals.   OBJECTIVE IMPAIRMENTS: Abnormal gait, decreased activity tolerance, decreased balance, decreased endurance, decreased mobility, difficulty walking, decreased ROM, decreased strength, postural dysfunction, and pain.   ACTIVITY LIMITATIONS: carrying, lifting, squatting, transfers, bed mobility, continence, and locomotion level  PARTICIPATION LIMITATIONS: meal prep, cleaning, laundry, shopping, community activity, and yard work  PERSONAL FACTORS: Fitness, Past/current experiences, Time since onset of injury/illness/exacerbation, and 1-2 comorbidities: hx of back surgery, diabetes are also affecting patient's functional outcome.   REHAB POTENTIAL: Fair chronic in nature  CLINICAL DECISION MAKING: Evolving/moderate complexity  EVALUATION COMPLEXITY: Moderate   GOALS: Goals reviewed with patient? Yes  SHORT TERM GOALS: Target date: 07/22/225  Pt will be independent with HEP in order to demonstrate participation in Physical Therapy POC.  Baseline:  09/05/23:  Reports compliance with HEP 2-3 times a week Goal status: IN PROGRESS  2.  Pt will report 3/10 pain at worst with mobility in order to demonstrate improved pain with ADLs.  Baseline: 09/05/23:  average pain scale 3-4/10 Goal status: IN PROGRESS  LONG TERM GOALS: Target date: 09/03/23  Pt will improve 5TSTS by at least 2.3  seconds in order to demonstrate improved functional strength to return to desired activities.  Baseline: see objective.  Goal status: MET  2.  Pt will improve 2 MWT by 50 feet without AD in order to demonstrate improved functional ambulatory capacity in community setting.  Baseline: see objective. ;09/05/23:  49ft with SPC Goal status: IN PROGRESS  3.  Pt will improve Modified Oswestry score by at least 6 points in order to demonstrate improved pain with functional goals and outcomes. Baseline: see objective. ;  Modified Oswestry Low Back Pain Disability Questionnaire: 23 / 50 = 46.0 % Goal status: IN PROGRESS  4.  Pt will report 1/10 pain at worst with mobility in order to demonstrate reduced pain with ADLs lasting greater than 30 minutes.  Baseline: see objective.  Goal status: IN PROGRESS   PLAN:  PT FREQUENCY: 1-2x/week  PT DURATION: 4 weeks  PLANNED INTERVENTIONS: 97110-Therapeutic exercises, 97530- Therapeutic activity, [id number]- Neuromuscular re-education, 97535- Self Care, [id number]- Manual therapy, [phone]- Gait training, Patient/Family education, Balance training, Stair training, Spinal mobilization, DME instructions, Cryotherapy, and Moist heat.  PLAN FOR NEXT SESSION: Review HEP and reassess, plan to discharge as pt reports she is having surgery next Wednesday and will get new referral.   Lang Ada, PT, DPT [facility] Office: [phone] 4:05 PM, 09/19/23

## 2023-09-20 ENCOUNTER — Encounter: Payer: Self-pay | Admitting: Cardiology

## 2023-09-20 ENCOUNTER — Ambulatory Visit: Attending: Cardiology | Admitting: Cardiology

## 2023-09-20 VITALS — BP 110/68 | HR 76 | Ht 65.0 in | Wt 239.6 lb

## 2023-09-20 DIAGNOSIS — D6851 Activated protein C resistance: Secondary | ICD-10-CM | POA: Diagnosis not present

## 2023-09-20 DIAGNOSIS — Q2112 Patent foramen ovale: Secondary | ICD-10-CM | POA: Diagnosis not present

## 2023-09-20 DIAGNOSIS — I493 Ventricular premature depolarization: Secondary | ICD-10-CM

## 2023-09-20 DIAGNOSIS — Z01810 Encounter for preprocedural cardiovascular examination: Secondary | ICD-10-CM

## 2023-09-20 NOTE — Patient Instructions (Signed)
 Medication Instructions:  No changes *If you need a refill on your cardiac medications before your next appointment, please call your pharmacy*  Lab Work: No labs If you have labs (blood work) drawn today and your tests are completely normal, you will receive your results only by: MyChart Message (if you have MyChart) OR A paper copy in the mail If you have any lab test that is abnormal or we need to change your treatment, we will call you to review the results.  Testing/Procedures: No testing  Follow-Up: At [facility], you and your health needs are our priority.  As part of our continuing mission to provide you with exceptional heart care, our providers are all part of one team.  This team includes your primary Cardiologist (physician) and Advanced Practice Providers or APPs (Physician Assistants and Nurse Practitioners) who all work together to provide you with the care you need, when you need it.  Your next appointment:   1 year(s)  Provider:   Maude Emmer, MD

## 2023-09-20 NOTE — Telephone Encounter (Signed)
 2nd request received. Pt has appt IN Office 09/20/23 with Katlyn West, NP and at that time the preop clearance will be addressed.  Will update the surgeons office.

## 2023-09-24 ENCOUNTER — Encounter (HOSPITAL_COMMUNITY): Payer: Self-pay

## 2023-09-24 ENCOUNTER — Ambulatory Visit (HOSPITAL_COMMUNITY): Attending: Orthopedic Surgery

## 2023-09-24 DIAGNOSIS — M5459 Other low back pain: Secondary | ICD-10-CM | POA: Insufficient documentation

## 2023-09-24 DIAGNOSIS — M546 Pain in thoracic spine: Secondary | ICD-10-CM | POA: Insufficient documentation

## 2023-09-24 DIAGNOSIS — Z7409 Other reduced mobility: Secondary | ICD-10-CM | POA: Insufficient documentation

## 2023-09-24 NOTE — Therapy (Signed)
 OUTPATIENT PHYSICAL THERAPY THORACOLUMBAR TREATMENT   Patient Name: Debbie Pineda MRN: [id number] DOB:[birth year], 63 y.o., female Today's Date: 09/24/2023  END OF SESSION:  PT End of Session - 09/24/23 1433     Visit Number 9    Number of Visits 12    Date for PT Re-Evaluation 10/04/23    Authorization Type [employer]    Authorization Time Period no auth required    PT Start Time 1433    PT Stop Time 1515    PT Time Calculation (min) 42 min    Activity Tolerance Patient tolerated treatment well    Behavior During Therapy WFL for tasks assessed/performed               Past Medical History:  Diagnosis Date   ADD (attention deficit disorder)    Arthritis    all over   Cerebral infarction (HCC) 10/30/2011   Cerebrovascular disease 08/14/2016   Chronic back pain    all over   Chronic low back pain 01/11/2016   Complication of anesthesia    tends to have hypotension when NPO and post-anesthesia   Constipation    takes stool softener daily   Degenerative disk disease    Degenerative joint disease    DVT (deep venous thrombosis) (HCC) 2014   RLE   Family history of adverse reaction to anesthesia    a family member woke up during surgery; think it was my mom   Fibromyalgia    Generalized osteoarthritis of multiple sites 11/04/2013   GERD (gastroesophageal reflux disease)    Headache    Heart palpitations 12/14/2013   resolved   History of blood clots    superficial   Hypoglycemia    Hypothyroid    takes Synthroid  daily   Incomplete emptying of bladder    Insomnia    takes Trazodone  nightly   Iron  deficiency anemia    takes Ferrous Sulfate  daily   Joint pain    Joint swelling    knees and ankles   Memory disorder 08/14/2016   Morbid obesity (HCC)    Nausea    takes Zofran  as needed.Seeing GI doc   Neck pain 05/25/2016   OSA on CPAP    tested more than 5 yrs ago.     Osteoarthritis    Osteopenia    in feet   PFO (patent foramen  ovale)    no murmur   PFO (patent foramen ovale)    Pre-diabetes    Primary osteoarthritis of both feet 05/29/2016   Right bunionectomy August 2017 by Dr. Harden   PVC's (premature ventricular contractions) 10/11/2021   Monitor 9/23: 1.8% PVCs; rare PACs; o/w NSR   Scoliosis    Skin abnormality 02/04/2020   raised area on lip   Stroke [facility]) severallast 2014   right foot weakness; memory issues, black spot right visual field since (03/23/2015)   Thrombophlebitis    Trochanteric bursitis of both hips 05/25/2016   Unilateral primary osteoarthritis, right knee 05/29/2016   Urinary urgency 04/19/2016   Vein disorder 11/29/2010   varicose veins both legs   Wears glasses    Past Surgical History:  Procedure Laterality Date   BIOPSY  11/14/2018   Procedure: BIOPSY;  Surgeon: Golda Claudis PENNER, MD;  [facility];  Service: Endoscopy;;  esophagusgastric   BIOPSY  06/07/2022   Procedure: BIOPSY;  Surgeon: Eartha Angelia Sieving, MD;  [facility]: [facility];  Service: Gastroenterology;;   BONE EXCISION Right 08/29/2017  Procedure: right trapezium excision;  Surgeon: Murrell Kuba, MD;  Location: [facility];  Service: Orthopedics;  Laterality: Right;   BUNIONECTOMY Right 08/2015   CARDIAC CATHETERIZATION     2008.  it was fine (not sure why she had it done, and doesn't know where)   CARPOMETACARPEL SUSPENSION PLASTY Right 08/29/2017   Procedure: SUSPENSION PLASTY RIGHT THUMB;  Surgeon: Murrell Kuba, MD;  Location: [facility];  Service: Orthopedics;  Laterality: Right;   COLONOSCOPY N/A 03/25/2013   Procedure: COLONOSCOPY;  Surgeon: Claudis RAYMOND Rivet, MD;  [facility]: [facility];  Service: Endoscopy;  Laterality: N/A;  930   ERCP  06/02/2020   ESOPHAGOGASTRODUODENOSCOPY     ESOPHAGOGASTRODUODENOSCOPY (EGD) WITH PROPOFOL  N/A 11/14/2018   Procedure: ESOPHAGOGASTRODUODENOSCOPY (EGD) WITH PROPOFOL ;  Surgeon: Rivet Claudis RAYMOND, MD;  [facility];  Service: Endoscopy;  Laterality: N/A;  1:55pm-office moved to 11:00am/pt notified to arrive at 9:30am per KF   ESOPHAGOGASTRODUODENOSCOPY (EGD) WITH PROPOFOL  N/A 06/07/2022   Procedure: ESOPHAGOGASTRODUODENOSCOPY (EGD) WITH PROPOFOL ;  Surgeon: Eartha Angelia Sieving, MD;  [facility]: [facility];  Service: Gastroenterology;  Laterality: N/A;  2:00pm;asa 3   EXPLORATORY LAPAROTOMY     took fallopian tubes out   HALLUX FUSION Right 02/12/2020   Procedure: HALLUX INTERPHANGEAL JOINT  FUSION;  Surgeon: Tobie Franky SQUIBB, DPM;  Location: [facility];  Service: Podiatry;  Laterality: Right;   HAMMER TOE SURGERY Right 02/12/2020   Procedure: SECOND AND THIRD HAMMER TOE CORRECTION; CAPSULOTOMY SECOND INTERPHALANGEAL JOINT;  Surgeon: Tobie Franky SQUIBB, DPM;  Location: [facility];  Service: Podiatry;  Laterality: Right;   JOINT REPLACEMENT     bil knee    KNEE ARTHROSCOPY Left    KNEE ARTHROSCOPY W/ ACL RECONSTRUCTION Right yrs ago   added pins   LAPAROSCOPIC CHOLECYSTECTOMY  ~ 2001   ROUX-EN-Y GASTRIC BYPASS  11/20/2010   [place]   SPINAL CORD STIMULATOR INSERTION N/A 04/18/2017   Procedure: LUMBAR SPINAL CORD STIMULATOR INSERTION;  Surgeon: Mindi Mt, MD;  Location[facility];  Service: Neurosurgery;  Laterality: N/A;  LUMBAR SPINAL CORD STIMULATOR INSERTION   stomach stent  04/28/2020   TENDON TRANSFER Right 08/29/2017   Procedure: right abductor pollicis longus transfer;  Surgeon: Murrell Kuba, MD;  Location: [facility];  Service: Orthopedics;  Laterality: Right;   TOTAL KNEE ARTHROPLASTY Left 03/23/2015   Procedure: TOTAL KNEE ARTHROPLASTY;  Surgeon: Jerona Harden GAILS, MD;  Location: [facility];  Service: Orthopedics;  Laterality: Left;   TOTAL KNEE ARTHROPLASTY Right 08/15/2016   Procedure: RIGHT TOTAL KNEE ARTHROPLASTY, REMOVAL ACL SCREWS;  Surgeon: Harden Jerona GAILS, MD;  Location: [facility];  Service: Orthopedics;  Laterality: Right;   TOTAL KNEE  ARTHROPLASTY WITH HARDWARE REMOVAL Right    UPPER GI ENDOSCOPY  12/06/2022   VAGINAL HYSTERECTOMY     tah/bso   VARICOSE VEIN SURGERY Right X 2   WEIL OSTEOTOMY Right 02/12/2020   Procedure: DOUBLE L OSTEOTOMY;  Surgeon: Tobie Franky SQUIBB, DPM;  Location: [facility];  Service: Podiatry;  Laterality: Right;   Patient Active Problem List   Diagnosis Date Noted   Nausea without vomiting 07/02/2023   Encounter for screening colonoscopy 07/02/2023   Belching 03/12/2023   History of seizures 07/10/2022   Melena 05/15/2022   PVC's (premature ventricular contractions) 10/11/2021   Factor V Leiden mutation (HCC) 10/11/2021   Other secondary scoliosis, lumbar region    Other spondylosis with radiculopathy, lumbar region  Spondylolisthesis, lumbar region    Thoracic spondylosis with radiculopathy    Radiculopathy, lumbar region    Breakdown (mechanical) of implanted electronic neurostimulator of spinal cord electrode (lead), sequela    Degenerative disc disease, lumbar    At risk for adverse drug event 08/31/2021   Acute on chronic blood loss anemia 08/28/2021   Drug-induced constipation 08/28/2021   GERD without esophagitis 08/28/2021   Overactive bladder 08/28/2021   Chronic migraine without aura 08/28/2021   Hx of blood clots 08/28/2021   Major neurocognitive disorder (HCC) 08/28/2021   Status post lumbar spinal fusion 08/21/2021   Weight gain 07/13/2021   Encounter for removal of internal fixation device 10/21/2020   LFTs abnormal 03/02/2020   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   Gastroesophageal reflux disease 09/22/2019   SSBE (short-segment Barrett's esophagus) 09/22/2019   Flatulence 09/22/2019   Migraine 11/11/2018   Abdominal pain, epigastric 10/14/2018   LUQ pain 10/14/2018   Attention deficit hyperactivity disorder (ADHD) 01/12/2018   Insomnia 01/12/2018   Elevated liver enzymes 09/10/2017   History of diabetes mellitus  06/06/2017   Primary osteoarthritis of right hand 06/06/2017   Transaminasemia    TIA (transient ischemic attack) 05/01/2017   Dysphasia 05/01/2017   Chronic pain syndrome 05/01/2017   Confusion    Presence of right artificial knee joint 09/17/2016   H/O total knee replacement, right 08/15/2016   Presence of retained hardware    Memory disorder 08/14/2016   Cerebrovascular disease 08/14/2016   Unilateral primary osteoarthritis, right knee 05/29/2016   Primary osteoarthritis of both feet 05/29/2016   Trochanteric bursitis of both hips 05/25/2016   Neck pain 05/25/2016   Urinary urgency 04/19/2016   Chronic low back pain 01/11/2016   Total knee replacement status 03/23/2015   Heart palpitations 12/14/2013   Generalized osteoarthritis of multiple sites 11/04/2013   Elevated LFTs 03/17/2012   Cerebral infarction, unspecified (HCC) 11/29/2011   Cerebral infarction (HCC) 10/30/2011   PFO (patent foramen ovale) 10/30/2011   Acquired hypothyroidism    Thrombophlebitis    OSA on CPAP    Fibromyalgia    Status post bariatric surgery 12/07/2010   Vein disorder 11/29/2010   S/P total hysterectomy and bilateral salpingo-oophorectomy 11/29/2010   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  Feels she has improved by 50-60% since began therapy.  Has been compliant with HEP 2 days a week.  Plans for abdominal surgery tomorrow.  Eval:  Pt reports mid to low back pain. Pt states she had back surgery and states the pain has gotten worse over the past few months. Pt states when she is up moving around she tends to slouched forward. Pt states she needs strengthening in her back muscles to avoid bending forward. Pt states she has been using SPC for years even before back surgery  PERTINENT HISTORY:  -Pt has had several strokes last one in 2014, right foot drags, memory issues -Diabetes, controlled with diet -Neuropathy -Arthritis -Fibromyalgia   PAIN:  Are you having pain? Yes: NPRS scale: 4/10 medicated now, worst in last week 8/10 Pain location: low and mid back pain Pain description: ache, dull and sharp on occasion Aggravating factors: standing up cooking, walking, sitting too long, Relieving factors: bending forward, extension with self pressure to low back, massage, heat, medications, lays on left side  PRECAUTIONS: None  RED FLAGS: Bowel or bladder incontinence: Yes: years   WEIGHT BEARING RESTRICTIONS: No  FALLS:  Has patient fallen in last 6 months? Yes. Number of falls 1, just loss of balance  OCCUPATION: disability  PLOF: Independent with household mobility with device, Requires assistive device for independence, and Needs assistance with homemaking, pt lives with mother, and husband  PATIENT GOALS: strengthen low/mid back, increase activity tolerance, improve walking and balance  NEXT MD VISIT: after therapy  OBJECTIVE:  Note: Objective measures were completed at Evaluation unless otherwise noted.  DIAGNOSTIC FINDINGS:  XRs of the thoracic spine from 06/24/2023 were independently reviewed and  interpreted, showing posterior instrumentation to T10. Several of the  screws appear to breach the superior endplate of their respective   vertebra. The T10 screws have lucency around them. There is kyphosis above  her fusion that measures 14 degrees. Her films immediately after surgery  measure about 12 degrees of kyphosis (Glattes). No fracture or dislocation  seen.  PATIENT SURVEYS:  08/22/23:  Modified Oswestry Low Back Pain Disability Questionnaire: 25 / 50 = 50.0  09/05/23:  Modified Oswestry Low Back Pain Disability Questionnaire: 23 / 50 = 46.0 %  09/24/23:  Modified Oswestry Low Back Pain Disability Questionnaire: 22 / 50 = 44.0 %  COGNITION: Overall cognitive status: memory issues     SENSATION: Light touch: Impaired , just feet, sensitivity to cold  POSTURE: rounded shoulders, forward head, decreased lumbar lordosis, increased thoracic kyphosis, and flexed trunk   PALPATION: Pt demonstrates tenderness to palpation of spinous processes of T5-T6 and L4-L5. Pt also demonstrates increased tone on right side of thoracic and lumbar spine.  THORACOLUMBAR ROM:   AROM eval  Flexion   Extension   Right lateral flexion 30 degrees, Pain, worse  Left lateral flexion 35 degrees, pain  Right rotation   Left rotation    (Blank rows = not tested)  LOWER EXTREMITY ROM:     Active  Right eval Left eval  Hip flexion    Hip extension    Hip abduction    Hip adduction    Hip internal rotation    Hip external rotation    Knee flexion    Knee extension    Ankle dorsiflexion    Ankle plantarflexion    Ankle inversion    Ankle eversion     (Blank rows = not  tested)  LOWER EXTREMITY MMT:    MMT Right eval Left eval Right 09/05/23 Left 09/05/23 Right 09/24/23: Left 09/24/23  Hip flexion 4 3 4+ 4 5/5 5/5  Hip extension 4 4 4 4  4- 4+  Hip abduction 4 4 4 4 4 4   Hip adduction 4 4      Hip internal rotation        Hip external rotation        Knee flexion 4 4 4+ 4+ 5 4+  Knee extension 4 4 4+ 4+ 5 5  Ankle dorsiflexion 4 4 4+ 4+ 5 5  Ankle plantarflexion        Ankle inversion        Ankle eversion          (Blank rows = not tested)    FUNCTIONAL TESTS:  Eval: 5 times sit to stand: 9.01 seconds 2 minute walk test: 351 feet 09/05/23: 418ft with SPC 5STS 6.88 09/24/23:   452ft with SPC  GAIT: Distance walked: 400 feet Assistive device utilized: Single point cane and None Level of assistance: Modified independence Comments: increased flexion of trunk as test progressed  TREATMENT DATE:  09/24/23: 453ft with SPC Reviewed goals Modified Oswestry Low Back Pain Disability Questionnaire: 22 / 50 = 44.0 % MMT see above  Quadruped: Bird Dog 5x 5 Supine:  Dead bug 10x 5 Bridge 10x   11-Oct-2023  Therapeutic Exercise: -Nustep, 4 minutes, pt cued for 70 SPM -D1 flexions with plate 3 on body craft, RUE better, performance, 1 set of 7 reps, pt cued for foot placement -Lat pull downs, plate 5 and plate 6, 2 sets of 10 reps, pt cued for contraction to be felt all the way through scapula and mid back -Supine bridge with LEs elevated on green exercise ball and hamstring curl, 1 sets of 5 reps, pt cued for UE placement for balance -Childs pose to prone press ups, 1 set of 5 reps, pt cued for hand placement and 5 second hold each rep Therapeutic Activity: -Sled pushes, 40lbs, 50 foot laps, pt cued for upright posture and increased ROM, 3 laps -Sit to stands with tidal tank sphere push out at chest level, 2 sets of 8 reps, pt cued for core activation -Unilateral farmer carries, 10lb kettle bell, pt cued for core contraction and neutral spine, level shoulders  09/17/2023  Therapeutic Exercise: -Treadmill, 5 minutes, grade 2.0, speed 1.6>2.0>2.2>2.4>2.2>2.0, pt cued for proper distance from monitor and upright posture -Paloff Press, 1 sets of 10 reps, bilaterally, RTB, pt cued for decreased rotation during shoulder flexion phase -Lat pull downs, plate 4 and plate 5, 2 sets of 10 reps, pt cued for contraction to be felt all the way through scapula and mid back -Supine bridge with LEs  elevated on green exercise ball, 5 second holds, 2 sets of 8 reps, pt cued for UE placement for balance -Karolynn pose to prone press ups, 1 set of 10 reps, pt cued for hand placement and 5 second hold each rep Neuromuscular Re-education: -Side planks, 1 set of 3 reps, 17 second holds bilaterally, pt cued for knee hip shoulder alignment, and decreased opposite UE support -Modified crunch, on mat table, 2 set of 10 reps, 3 second holds, pt cued for proper sequencing Therapeutic Activity: -Lateral step up and overs, 2 sets of 5 reps, first set with 6 inch step, second set with 8 inch step, increased knee pain reported especially on RLE -Sit to stands with tidal  tank sphere push out at chest level, 2 sets of 8 reps, pt cued for core activation  09/12/2023  Therapeutic Exercise: -Treadmill, 5 minutes, grade 0.0, speed 1.4>1.8>2.0>2.2>1.8, pt cued for proper distance from monitor and upright posture -Paloff Press, 2 sets of 10 reps, bilaterally, RTB, pt cued for decreased rotation during shoulder flexion phase -Lat pull downs, plate 3 and plate 4, 2 sets of 10 reps, pt cued for contraction to be felt all the way through scapula and mid back -Cat cow stretch, 1 set of 10 reps, pt cued for hands underneath shoulders -Childs pose to prone press ups, 1 set of 10 reps, pt cued for hand placement and 5 second hold each rep Neuromuscular Re-education: -Side bridges, 1 set of 2 reps, 15 second holds bilaterally, pt cued for knee hip shoulder alignment -Dead bugs, on mat table, 2 set of 10 reps, pt cued for proper sequencing -STS with tidal tank sphere for core activation from lowered mat table, 2 sets of 8 reps, pt cued to scoot to edge of seat to avoid pushing off of LE    PATIENT EDUCATION:  Education details: Pt was educated on findings of PT evaluation, prognosis, frequency of therapy visits and rationale, attendance policy, and HEP if given.   Person educated: Patient Education method: Explanation,  Verbal cues, and Handouts Education comprehension: verbalized understanding, verbal cues required, and needs further education  HOME EXERCISE PROGRAM: Access Code: [email or web address] URL: [email or web address] Date: 07/23/2023 Prepared by: Lang Ada  Exercises - Supine Bridge  - 1 x daily - 7 x weekly - 3 sets - 10 reps - Supine Lower Trunk Rotation  - 1 x daily - 7 x weekly - 3 sets - 10 reps - Seated Scapular Retraction  - 1 x daily - 7 x weekly - 3 sets - 10 reps  08/22/23: Decompression  Access Code: [email or web address] URL: [email or web address] Date: 08/29/2023 - Seated Long Arc Quad  - 1 x daily - 7 x weekly - 3 sets - 10 reps - 5 sec hold - Active Straight Leg Raise with Quad Set  - 1 x daily - 7 x weekly - 3 sets - 10 reps - Sidelying Hip Abduction  - 1 x daily - 7 x weekly - 3 sets - 10 reps - Sidelying Hip Adduction  - 1 x daily - 7 x weekly - 3 sets - 10 reps - Prone Heel Squeeze  - 1 x daily - 7 x weekly - 3 sets - 10 reps - 5 sec hold - Prone Hip Extension  - 1 x daily - 7 x weekly - 3 sets - 10 reps 09/10/23: Access Code: [email or web address] URL: [email or web address] Date: 09/10/2023 Prepared by: Lang Ada  Exercises - Supine Dead Bug with Leg Extension  - 1 x daily - 7 x weekly - 3 sets - 10 reps  09/24/23: GLENWOOD Chang Dog  - 1 x daily - 7 x weekly - 2 sets - 10 reps - 5 hold - Dead Bug  - 1 x daily - 7 x weekly - 2 sets - 10 reps - 5 hold - Supine Bridge with Heels on Swiss Ball and Knees Bent  - 1 x daily - 7 x weekly - 2 sets - 10 reps - 5 hold  ASSESSMENT:  CLINICAL IMPRESSION: Reviewed goals with the following findings:  Pt has met 1/2 STG and 2/4 LTGs.  Pt presents with improvements including faster cadence during and some improvements with strength.  Has improved 3  point with Modified Oswestry score since initial eval.  Pt continues to be limited by pain with average pain scale 4-5/10.  Reviewed current exercises to complete at home with  some additional gluteal and core strengthening exercises, pt able to demonstrate with good mechanics and verbalized understanding. DC to HEP.  Pt feels she wound continue to benefit from skilled intervention, encouraged pt to discuss with MD and new referral if found appropriate.    Eval:  Patient is a 63 y.o. female who was seen today for physical therapy evaluation and treatment for M54.9 (ICD-10-CM) - Mid back pain. Patient demonstrates decreased core/LE strength, abnormal pain rating in thoracic and lumbar spine, abnormal gait, and impaired balance. Patient also demonstrates difficulty with ambulation during today's session with decreased stride length and velocity noted. Patient also demonstrates ability to complete the without an AD although gait deviations became more apparent at test progressed. Patient requires education on role of PT, prognosis, HEP and compliance. Patient would benefit from skilled physical therapy for increased endurance with ambulation, increased core/LE strength, and balance for improved gait quality, return to higher level of function with ADLs, and progress towards therapy goals.   OBJECTIVE IMPAIRMENTS: Abnormal gait, decreased activity tolerance, decreased balance, decreased endurance, decreased mobility, difficulty walking, decreased ROM, decreased strength, postural dysfunction, and pain.   ACTIVITY LIMITATIONS: carrying, lifting, squatting, transfers, bed mobility, continence, and locomotion level  PARTICIPATION LIMITATIONS: meal prep, cleaning, laundry, shopping, community activity, and yard work  PERSONAL FACTORS: Fitness, Past/current experiences, Time since onset of injury/illness/exacerbation, and 1-2 comorbidities: hx of back surgery, diabetes are also affecting patient's functional outcome.   REHAB POTENTIAL: Fair chronic in nature  CLINICAL DECISION MAKING: Evolving/moderate complexity  EVALUATION COMPLEXITY: Moderate   GOALS: Goals reviewed  with patient? Yes  SHORT TERM GOALS: Target date: 07/22/225  Pt will be independent with HEP in order to demonstrate participation in Physical Therapy POC.  Baseline:  09/05/23:  Reports compliance with HEP 2-3 times a week Goal status: MET  2.  Pt will report 3/10 pain at worst with mobility in order to demonstrate improved pain with ADLs.  Baseline: 09/05/23:  average pain scale 3-4/10; 09/24/23:  Average pain scale 4-5/10 Goal status: IN PROGRESS  LONG TERM GOALS: Target date: 09/03/23  Pt will improve 5TSTS by at least 2.3 seconds in order to demonstrate improved functional strength to return to desired activities.  Baseline: see objective.  Goal status: MET  2.  Pt will improve 2 MWT by 50 feet without AD in order to demonstrate improved functional ambulatory capacity in community setting.  Baseline: see objective.; Eval:351 feet; 09/05/23:  454ft with SPC; 09/24/23:  419ft with SPC Goal status: MET  3.  Pt will improve Modified Oswestry score by at least 6 points in order to demonstrate improved pain with functional goals and outcomes. Baseline: see objective. ; Eval: 25 / 50 = 50.0 Modified Oswestry Low Back Pain Disability Questionnaire: 23 / 50 = 46.0 %; 09/24/23:  Modified Oswestry Low Back Pain Disability Questionnaire: 22 / 50 = 44.0 %  Goal status: IN PROGRESS  4.  Pt will report 1/10 pain at worst with mobility in order to demonstrate reduced pain with ADLs lasting greater than 30 minutes.  Baseline: see objective.; 09/24/23:  Average pain scale 4-5/10 Goal status: IN PROGRESS   PLAN:  PT FREQUENCY: 1-2x/week  PT DURATION: 4 weeks  PLANNED INTERVENTIONS: 97110-Therapeutic exercises, 97530- Therapeutic activity, 97112- Neuromuscular re-education, 97535- Self Care, 02859- Manual therapy,  02883- Gait training, Patient/Family education, Balance training, Stair training, Spinal mobilization, DME instructions, Cryotherapy, and Moist heat.  PLAN FOR NEXT SESSION: DC to  HEP.   Augustin Mclean, LPTA/CLT; [facility] [phone]  3:58 PM, 09/24/23

## 2023-09-25 DIAGNOSIS — Z91048 Other nonmedicinal substance allergy status: Secondary | ICD-10-CM | POA: Diagnosis not present

## 2023-09-25 DIAGNOSIS — Z90710 Acquired absence of both cervix and uterus: Secondary | ICD-10-CM | POA: Diagnosis not present

## 2023-09-25 DIAGNOSIS — Z79899 Other long term (current) drug therapy: Secondary | ICD-10-CM | POA: Diagnosis not present

## 2023-09-25 DIAGNOSIS — K660 Peritoneal adhesions (postprocedural) (postinfection): Secondary | ICD-10-CM | POA: Diagnosis not present

## 2023-09-25 DIAGNOSIS — Z882 Allergy status to sulfonamides status: Secondary | ICD-10-CM | POA: Diagnosis not present

## 2023-09-25 DIAGNOSIS — Z888 Allergy status to other drugs, medicaments and biological substances status: Secondary | ICD-10-CM | POA: Diagnosis not present

## 2023-09-25 DIAGNOSIS — K316 Fistula of stomach and duodenum: Secondary | ICD-10-CM | POA: Diagnosis not present

## 2023-09-25 DIAGNOSIS — K2970 Gastritis, unspecified, without bleeding: Secondary | ICD-10-CM | POA: Diagnosis not present

## 2023-09-25 DIAGNOSIS — Z7901 Long term (current) use of anticoagulants: Secondary | ICD-10-CM | POA: Diagnosis not present

## 2023-09-25 DIAGNOSIS — F341 Dysthymic disorder: Secondary | ICD-10-CM | POA: Diagnosis not present

## 2023-09-25 DIAGNOSIS — Q2112 Patent foramen ovale: Secondary | ICD-10-CM | POA: Diagnosis not present

## 2023-09-25 DIAGNOSIS — Z9884 Bariatric surgery status: Secondary | ICD-10-CM | POA: Diagnosis not present

## 2023-09-25 DIAGNOSIS — Z87891 Personal history of nicotine dependence: Secondary | ICD-10-CM | POA: Diagnosis not present

## 2023-09-25 DIAGNOSIS — F418 Other specified anxiety disorders: Secondary | ICD-10-CM | POA: Diagnosis not present

## 2023-09-25 DIAGNOSIS — K9589 Other complications of other bariatric procedure: Secondary | ICD-10-CM | POA: Diagnosis not present

## 2023-09-25 DIAGNOSIS — Z6841 Body Mass Index (BMI) 40.0 and over, adult: Secondary | ICD-10-CM | POA: Diagnosis not present

## 2023-09-25 DIAGNOSIS — E039 Hypothyroidism, unspecified: Secondary | ICD-10-CM | POA: Diagnosis not present

## 2023-09-25 DIAGNOSIS — I69341 Monoplegia of lower limb following cerebral infarction affecting right dominant side: Secondary | ICD-10-CM | POA: Diagnosis not present

## 2023-09-25 DIAGNOSIS — Z881 Allergy status to other antibiotic agents status: Secondary | ICD-10-CM | POA: Diagnosis not present

## 2023-09-25 DIAGNOSIS — D6851 Activated protein C resistance: Secondary | ICD-10-CM | POA: Diagnosis not present

## 2023-09-25 DIAGNOSIS — Z9049 Acquired absence of other specified parts of digestive tract: Secondary | ICD-10-CM | POA: Diagnosis not present

## 2023-09-25 DIAGNOSIS — E119 Type 2 diabetes mellitus without complications: Secondary | ICD-10-CM | POA: Diagnosis not present

## 2023-09-25 DIAGNOSIS — Z6839 Body mass index (BMI) 39.0-39.9, adult: Secondary | ICD-10-CM | POA: Diagnosis not present

## 2023-09-25 HISTORY — PX: ROUX-EN-Y GASTRIC BYPASS: SHX1104

## 2023-09-26 ENCOUNTER — Encounter (HOSPITAL_COMMUNITY)

## 2023-10-01 DIAGNOSIS — Z556 Problems related to health literacy: Secondary | ICD-10-CM | POA: Diagnosis not present

## 2023-10-01 DIAGNOSIS — E559 Vitamin D deficiency, unspecified: Secondary | ICD-10-CM | POA: Diagnosis not present

## 2023-10-01 DIAGNOSIS — Z8673 Personal history of transient ischemic attack (TIA), and cerebral infarction without residual deficits: Secondary | ICD-10-CM | POA: Diagnosis not present

## 2023-10-01 DIAGNOSIS — F341 Dysthymic disorder: Secondary | ICD-10-CM | POA: Diagnosis not present

## 2023-10-01 DIAGNOSIS — G4700 Insomnia, unspecified: Secondary | ICD-10-CM | POA: Diagnosis not present

## 2023-10-01 DIAGNOSIS — Z48815 Encounter for surgical aftercare following surgery on the digestive system: Secondary | ICD-10-CM | POA: Diagnosis not present

## 2023-10-01 DIAGNOSIS — H8110 Benign paroxysmal vertigo, unspecified ear: Secondary | ICD-10-CM | POA: Diagnosis not present

## 2023-10-01 DIAGNOSIS — F419 Anxiety disorder, unspecified: Secondary | ICD-10-CM | POA: Diagnosis not present

## 2023-10-01 DIAGNOSIS — Q2111 Secundum atrial septal defect: Secondary | ICD-10-CM | POA: Diagnosis not present

## 2023-10-01 DIAGNOSIS — E039 Hypothyroidism, unspecified: Secondary | ICD-10-CM | POA: Diagnosis not present

## 2023-10-01 DIAGNOSIS — Z6839 Body mass index (BMI) 39.0-39.9, adult: Secondary | ICD-10-CM | POA: Diagnosis not present

## 2023-10-01 DIAGNOSIS — Z7985 Long-term (current) use of injectable non-insulin antidiabetic drugs: Secondary | ICD-10-CM | POA: Diagnosis not present

## 2023-10-01 DIAGNOSIS — D6851 Activated protein C resistance: Secondary | ICD-10-CM | POA: Diagnosis not present

## 2023-10-01 DIAGNOSIS — Q2112 Patent foramen ovale: Secondary | ICD-10-CM | POA: Diagnosis not present

## 2023-10-01 DIAGNOSIS — H53469 Homonymous bilateral field defects, unspecified side: Secondary | ICD-10-CM | POA: Diagnosis not present

## 2023-10-01 DIAGNOSIS — F32A Depression, unspecified: Secondary | ICD-10-CM | POA: Diagnosis not present

## 2023-10-01 DIAGNOSIS — M1990 Unspecified osteoarthritis, unspecified site: Secondary | ICD-10-CM | POA: Diagnosis not present

## 2023-10-01 DIAGNOSIS — K316 Fistula of stomach and duodenum: Secondary | ICD-10-CM | POA: Diagnosis not present

## 2023-10-01 DIAGNOSIS — Z9884 Bariatric surgery status: Secondary | ICD-10-CM | POA: Diagnosis not present

## 2023-10-01 NOTE — Care Plan (Signed)
 Transition of Care Encounter Data   Call attempt: 1 Admission date: 09/25/23 Discharge date: 09/29/23 Discharge diagnosis: S/P robotic takedown of gastrogastric fistula Do you have a hospital follow up appointment?: Yes - Within 14 Days, Yes with Specialty Provider clinic F/U Date: 10/04/23 F/U Provider: Trinidad Annitta Hasten, MD Patient post discharge: Are you able to make it to your f/u appt.?: Yes Since your discharge, are your symptoms better, worse, or the same?: Better Have you developed any new symptoms?: No Is there someone to help you at home?: Yes  Are there questions we can help clarify before your next appointment?: No Medications:      Were you able to pick up all of your newly prescribed medications (and/or any necessary refills)?: Yes  Were medications prescribed or ordered upon discharge reviewed today with the most recent outpatient medication list?: Yes Patient was reminded to bring all of their medication bottles to their follow up appointment: Yes If Home Health Services or Medical Equipment was ordered, has it arrived?: Yes (Comment: [facility] has contacted patient and are coming out today) Was the patients f/u appt. date/time and location confirmed with the patient?: Yes Remind Patients: If patient has a non-emergent medical problem, they may contact a nurse 24/7 or patient may call their provider's clinic. If experiencing a medical emergency, patient should call 911: Yes .   [facility]: [phone]:  .  [facility]: [phone]:  .  Other: Contact PCP:          Other Special Considerations    Post-Surgical  Do you have any new or worsening pain?: No  Please describe the surgical site?: drain site bleeding, I let her know to report this to surgeon office  Do you have any redness, drainage, or swelling at the surgical site?: Yes  Do you have a fever or signs and symptoms of a fever?: No  Do you have any new symptoms or worsening shortness of breath?: No  Do you have any  nausea, vomiting, and/or diarrhea?: No  Do you know how to contact your provider during clinic hours, after clinic hours/weekend for any urgent concerns?: Yes  Are you taking anti-thrombotic therapy as ordered? (Meds may include Aspirin , clopidogrel (Plavix), ticagrelor (Brilinta), prasugrel (Effient), Lovenox  injections, Coumadin): Yes  Are you following the discharge instructions regarding activities at home and surgical follow up?: Yes  Do you have any questions about your discharge instructions?: No  Transitional Care Manager contacted patient provider office via telephone and warm hand off completed: N/A      Low priority, no response needed unless change in care.   I am reaching out to [employer] as part of the transitional case management team regarding her recent hospital discharge.  Patient advised to Contact PCP or Complex Case Management if any needs arise.SABRA   Next appt with PCP: 10/04/23   See chart for med list if needed  Sharing communication as part of regulatory requirements.     Camie LITTIE Moll, RN

## 2023-10-03 ENCOUNTER — Encounter (INDEPENDENT_AMBULATORY_CARE_PROVIDER_SITE_OTHER): Payer: Self-pay | Admitting: *Deleted

## 2023-10-04 DIAGNOSIS — K316 Fistula of stomach and duodenum: Secondary | ICD-10-CM | POA: Diagnosis not present

## 2023-10-11 NOTE — Progress Notes (Signed)
 Date:  10/25/2023   ID:  Debbie Pineda, DOB 09/04/1960, MRN [id number]   PCP:  Leonce Lucie PARAS, PA-C  Cardiologist:  Maude Emmer, MD New to me  Electrophysiologist:  None   Evaluation Performed:  Follow-Up Visit  Chief Complaint:  PFO  History of Present Illness:    63 y.o. history of factor V Leiden with CVA on chronic xarelto . Some residual right sided weakness ? 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 Feeling back to normal. Sats can be low unless she takes some deep breaths Just finished PT/OT and going to see PA Lucie Leonce to see when she can resume Ozempic   Past Medical History:  Diagnosis Date   ADD (attention deficit disorder)    Arthritis    all over   Cerebral infarction (HCC) 10/30/2011   Cerebrovascular disease 08/14/2016   Chronic back pain    all over   Chronic low back pain 01/11/2016   Complication of anesthesia    tends to have hypotension when NPO and post-anesthesia   Constipation    takes stool softener daily   Degenerative disk disease    Degenerative joint  disease    DVT (deep venous thrombosis) (HCC) 2014   RLE   Family history of adverse reaction to anesthesia    a family member woke up during surgery; think it was my mom   Fibromyalgia    Generalized osteoarthritis of multiple sites 11/04/2013   GERD (gastroesophageal reflux disease)    Headache    Heart palpitations 12/14/2013   resolved   History of blood clots    superficial   Hypoglycemia    Hypothyroid    takes Synthroid  daily   Incomplete emptying of bladder    Insomnia    takes Trazodone  nightly   Iron  deficiency anemia    takes Ferrous Sulfate  daily   Joint pain    Joint swelling    knees and ankles   Memory disorder 08/14/2016   Morbid obesity (HCC)    Nausea    takes Zofran  as needed.Seeing GI doc   Neck pain 05/25/2016   OSA on CPAP    tested more than 5 yrs ago.     Osteoarthritis    Osteopenia    in feet   PFO (patent foramen ovale)    no murmur   PFO (patent foramen ovale)    Pre-diabetes    Primary osteoarthritis of both feet 05/29/2016   Right bunionectomy August 2017 by Dr. Harden   PVC's (premature ventricular contractions) 10/11/2021   Monitor 9/23: 1.8% PVCs; rare PACs; o/w  NSR   Scoliosis    Skin abnormality 02/04/2020   raised area on lip   Stroke [facility]) severallast 2014   right foot weakness; memory issues, black spot right visual field since (03/23/2015)   Thrombophlebitis    Trochanteric bursitis of both hips 05/25/2016   Unilateral primary osteoarthritis, right knee 05/29/2016   Urinary urgency 04/19/2016   Vein disorder 11/29/2010   varicose veins both legs   Wears glasses    Past Surgical History:  Procedure Laterality Date   BIOPSY  11/14/2018   Procedure: BIOPSY;  Surgeon: Golda Claudis PENNER, MD;  [facility];  Service: Endoscopy;;  esophagusgastric   BIOPSY  06/07/2022   Procedure: BIOPSY;  Surgeon: Eartha Angelia Sieving, MD;  [facility];  Service: Gastroenterology;;   BONE EXCISION Right  08/29/2017   Procedure: right trapezium excision;  Surgeon: Murrell Kuba, MD;  Location: [facility];  Service: Orthopedics;  Laterality: Right;   BUNIONECTOMY Right 08/2015   CARDIAC CATHETERIZATION     2008.  it was fine (not sure why she had it done, and doesn't know where)   CARPOMETACARPEL SUSPENSION PLASTY Right 08/29/2017   Procedure: SUSPENSION PLASTY RIGHT THUMB;  Surgeon: Murrell Kuba, MD;  Location: [facility];  Service: Orthopedics;  Laterality: Right;   COLONOSCOPY N/A 03/25/2013   Procedure: COLONOSCOPY;  Surgeon: Claudis PENNER Golda, MD;  [facility];  Service: Endoscopy;  Laterality: N/A;  930   ERCP  06/02/2020   ESOPHAGOGASTRODUODENOSCOPY     ESOPHAGOGASTRODUODENOSCOPY (EGD) WITH PROPOFOL  N/A 11/14/2018   Procedure: ESOPHAGOGASTRODUODENOSCOPY (EGD) WITH PROPOFOL ;  Surgeon: Golda Claudis PENNER, MD;  [facility];  Service: Endoscopy;  Laterality: N/A;  1:55pm-office moved to 11:00am/pt notified to arrive at 9:30am per KF   ESOPHAGOGASTRODUODENOSCOPY (EGD) WITH PROPOFOL  N/A 06/07/2022   Procedure: ESOPHAGOGASTRODUODENOSCOPY (EGD) WITH PROPOFOL ;  Surgeon: Eartha Angelia Sieving, MD;  [facility];  Service: Gastroenterology;  Laterality: N/A;  2:00pm;asa 3   EXPLORATORY LAPAROTOMY     took fallopian tubes out   HALLUX FUSION Right 02/12/2020   Procedure: HALLUX INTERPHANGEAL JOINT  FUSION;  Surgeon: Tobie Franky SQUIBB, DPM;  Location: [facility];  Service: Podiatry;  Laterality: Right;   HAMMER TOE SURGERY Right 02/12/2020   Procedure: SECOND AND THIRD HAMMER TOE CORRECTION; CAPSULOTOMY SECOND INTERPHALANGEAL JOINT;  Surgeon: Tobie Franky SQUIBB, DPM;  Location: [facility];  Service: Podiatry;  Laterality: Right;   JOINT REPLACEMENT     bil knee    KNEE ARTHROSCOPY Left    KNEE ARTHROSCOPY W/ ACL RECONSTRUCTION Right yrs ago   added pins   LAPAROSCOPIC CHOLECYSTECTOMY  ~ 2001   ROUX-EN-Y  GASTRIC BYPASS  11/20/2010   [place]   SPINAL CORD STIMULATOR INSERTION N/A 04/18/2017   Procedure: LUMBAR SPINAL CORD STIMULATOR INSERTION;  Surgeon: Mindi Mt, MD;  Location[facility];  Service: Neurosurgery;  Laterality: N/A;  LUMBAR SPINAL CORD STIMULATOR INSERTION   stomach stent  04/28/2020   TENDON TRANSFER Right 08/29/2017   Procedure: right abductor pollicis longus transfer;  Surgeon: Murrell Kuba, MD;  Location: [facility];  Service: Orthopedics;  Laterality: Right;   TOTAL KNEE ARTHROPLASTY Left 03/23/2015   Procedure: TOTAL KNEE ARTHROPLASTY;  Surgeon: Jerona Harden GAILS, MD;  Location: [facility];  Service: Orthopedics;  Laterality: Left;   TOTAL KNEE ARTHROPLASTY Right 08/15/2016   Procedure: RIGHT TOTAL KNEE ARTHROPLASTY, REMOVAL ACL SCREWS;  Surgeon: Harden Jerona GAILS, MD;  Location: [facility];  Service: Orthopedics;  Laterality: Right;   TOTAL KNEE ARTHROPLASTY WITH HARDWARE REMOVAL Right    UPPER GI ENDOSCOPY  12/06/2022   VAGINAL HYSTERECTOMY     tah/bso   VARICOSE VEIN SURGERY Right X 2   WEIL OSTEOTOMY Right 02/12/2020   Procedure: DOUBLE L OSTEOTOMY;  Surgeon: Tobie Franky SQUIBB, DPM;  Location: [facility];  Service: Podiatry;  Laterality: Right;     Current Meds  Medication Sig   amoxicillin (AMOXIL) 500 MG capsule as needed. Takes when she is going for dental procedures   atorvastatin (LIPITOR) 10 MG tablet SMARTSIG:1 Tablet(s) By Mouth Every Evening   baclofen  (LIORESAL ) 10 MG tablet Take 1 tablet (10 mg total) by mouth 3 (three) times daily.   Blood Glucose Monitoring Suppl (FREESTYLE PRECISION NEO SYSTEM) w/Device KIT    celecoxib (CELEBREX) 200 MG capsule Take 200 mg by mouth 2 (two) times daily as needed.   diclofenac  sodium (VOLTAREN ) 1 % GEL Apply 4 g topically 4 (four) times daily as needed (PAIN). (Patient taking differently: Apply 4 g topically as needed (PAIN).)   DULoxetine  (CYMBALTA ) 60 MG capsule Take 60 mg by mouth 2 (two) times daily.    estradiol  (ESTRACE ) 1 MG tablet Take 1 mg by mouth daily.   famotidine  (PEPCID ) 40 MG tablet Take 1 tablet by mouth at bedtime.   fluconazole (DIFLUCAN) 150 MG tablet Take by mouth as needed.   gabapentin  (NEURONTIN ) 800 MG tablet 800 mg. One tid   Galcanezumab -gnlm (EMGALITY ) 120 MG/ML SOSY INJECT 1 ML SUBCUTANEOUSLY  ONCE EVERY MONTH   levothyroxine  (SYNTHROID ) 125 MCG tablet Take 1 tablet (125 mcg total) by mouth daily before breakfast.   Methylfol-Methylcob-Acetylcyst (METAFOLBIC PLUS) 6-2-600 MG TABS Take 1 tablet by mouth daily.   methylphenidate  (CONCERTA ) 27 MG PO CR tablet Take 1 tablet (27 mg total) by mouth 2 (two) times daily. 10 am & 1400   metoCLOPramide  (REGLAN ) 5 MG tablet Take 1 tablet by mouth twice daily.   miconazole (ZEASORB-AF) 2 % powder as needed. Every shift as needed. Apply to red areas in folds of skin.   mirabegron  ER (MYRBETRIQ ) 50 MG TB24 tablet Take 1 tablet (50 mg total) by mouth daily.   naloxegol  oxalate (MOVANTIK ) 25 MG TABS tablet TAKE (1) TABLET BY MOUTH ONCE DAILY.   NARCAN  4 MG/0.1ML LIQD nasal spray kit Place 0.1 sprays (0.4 mg total) into the nose 2 (two) times daily as needed.   NUCYNTA  ER 100 MG 12 hr tablet SMARTSIG:1 Tablet(s) By Mouth Every 12 Hours   omeprazole  (PRILOSEC) 40 MG capsule Take 1 capsule (40 mg total) by mouth 2 (two) times daily.   ondansetron  (ZOFRAN -ODT) 4 MG disintegrating tablet Take 1 tablet (4 mg total) by mouth every 8 (eight) hours as needed for nausea or vomiting.   ONETOUCH ULTRA test strip    OneTouch UltraSoft 2 Lancets MISC    Oxycodone  HCl 10 MG TABS Take 10 mg by mouth every 6 (six) hours as needed.   OZEMPIC, 1 MG/DOSE, 4 MG/3ML SOPN Inject 1 mg into the skin once a week.   Rimegepant Sulfate  (NURTEC) 75 MG TBDP DISSOLVE 1 TABLET BY MOUTH ONCE DAILY AS NEEDED, MAX 1 TABLET DAILY.   rivaroxaban  (XARELTO ) 20 MG TABS tablet Take 1 tablet (20 mg total) by mouth daily at 6 PM.   tiZANidine  (ZANAFLEX ) 4 MG tablet Take 1  tablet (4 mg total) by mouth every 6 (six) hours as needed for muscle spasms.  topiramate  (TOPAMAX ) 100 MG tablet Take 1 tablet (100 mg total) by mouth 2 (two) times daily.   traZODone  (DESYREL ) 100 MG tablet Take 0.5 tablets (50 mg total) by mouth at bedtime.   triamcinolone  cream (KENALOG ) 0.1 % SMARTSIG:1 Application Topical 2-3 Times Daily   trospium  (SANCTURA ) 20 MG tablet Take 1 tablet (20 mg total) by mouth 2 (two) times daily.     Allergies:   Lyrica [pregabalin], Belsomra [suvorexant], Morphine  and codeine, Sulfamethoxazole-trimethoprim, and Tape   Social History   Tobacco Use   Smoking status: Former    Current packs/day: 0.00    Average packs/day: 0.8 packs/day for 8.0 years (6.0 ttl pk-yrs)    Types: Cigarettes    Start date: 12/01/1982    Quit date: 12/01/1990    Years since quitting: 32.9    Passive exposure: Past   Smokeless tobacco: Never  Vaping Use   Vaping status: Never Used  Substance Use Topics   Alcohol  use: No    Comment: 03/23/2015 stopped drinking in 2012 w/gastric bypass; drank socially before bypass   Drug use: No     Family Hx: The patient's family history includes Breast cancer in her mother; Cancer in her brother, father, maternal grandfather, and mother; Diabetes in her brother; Heart attack in her sister; Heart disease in her brother, father, and sister; Hypertension in an other family member; Hypothyroidism in her daughter; Myasthenia gravis in her mother; Parkinson's disease in her father; Stroke in her brother. There is no history of Colon cancer.  ROS:   Please see the history of present illness.     All other systems reviewed and are negative.   Prior CV studies:   The following studies were reviewed today:  Echo 01/18/20 IMPRESSIONS     1. Left ventricular ejection fraction, by estimation, is 70 to 75%. The  left ventricle has hyperdynamic function. The left ventricle has no  regional wall motion abnormalities. Left ventricular  diastolic parameters  were normal.   2. Evidence of atrial level shunting detected by color flow Doppler.  Agitated saline contrast bubble study was positive with shunting observed  within 3-6 cardiac cycles suggestive of interatrial shunt. There is a  moderately sized patent foramen ovale.   3. Right ventricular systolic function is normal. The right ventricular  size is normal.   4. The mitral valve is normal in structure. Mild mitral valve  regurgitation.   5. The aortic valve is abnormal. Aortic valve regurgitation is not  visualized. Mild aortic valve sclerosis is present, with no evidence of  aortic valve stenosis.   6. The inferior vena cava is normal in size with greater than 50%  respiratory variability, suggesting right atrial pressure of 3 mmHg.   Comparison(s): The left ventricular function is unchanged.   Monitor 10/19/21  Patch Wear Time:  3 days and 0 hours (2023-09-22T22:31:54-0400 to 2023-09-25T22:43:09-398)   Patient had a min HR of 56 bpm, max HR of 128 bpm, and avg HR of 83 bpm. Predominant underlying rhythm was Sinus Rhythm. Isolated SVEs were rare (<1.0%), SVE Couplets were rare (<1.0%), and SVE Triplets were rare (<1.0%). Isolated VEs were occasional  (1.8%, 6223), VE Couplets were rare (<1.0%, 1), and no VE Triplets were present. Ventricular Bigeminy and Trigeminy were present.    Maude Emmer MD [facility]  Myovue 02/02/22  Narrative & Impression      No ST deviation was noted during pharmacological stress.   LV perfusion is normal. There is no evidence of ischemia.  There is no evidence of infarction.   Nuclear stress EF: 73 %. The left ventricular ejection fraction is hyperdynamic (>65%).   The study is normal. The study is low risk.      Labs/Other Tests and Data Reviewed:    EKG:   10/11/21 SR normal QT nonspecific ST changes PVC;s   Recent Labs: 03/04/2023: Hemoglobin 13.9; Platelets 276   Recent Lipid Panel Lab Results  Component Value Date/Time    CHOL 151 05/02/2017 04:19 AM   TRIG 54 05/02/2017 04:19 AM   HDL 74 05/02/2017 04:19 AM   CHOLHDL 2.0 05/02/2017 04:19 AM   LDLCALC 66 05/02/2017 04:19 AM    Wt Readings from Last 3 Encounters:  10/25/23 228 lb 3.2 oz (103.5 kg)  09/20/23 239 lb 9.6 oz (108.7 kg)  07/03/23 248 lb 9.6 oz (112.8 kg)     Objective:    Vital Signs:  BP 100/70   Pulse (!) 103   Ht 5' 5 (1.651 m)   Wt 228 lb 3.2 oz (103.5 kg)   SpO2 97%   BMI 37.97 kg/m    Affect appropriate Healthy:  appears stated age HEENT: normal Neck supple with no adenopathy JVP normal no bruits no thyromegaly Lungs clear with no wheezing and good diaphragmatic motion Heart:  S1/S2 no murmur, no rub, gallop or click PMI normal Abdomen: benighn prior gastric surgery  Distal pulses intact with no bruits No edema Neuro residual right sided weakness from CVA  Skin warm and dry No muscular weakness Post right foot surgery x 2    ASSESSMENT & PLAN:    1.  PFO documented TTE 01/18/20  Has been 9 years since placed on anticoagulation with no recurrence no indication for closure referral    2.  Bilateral venous varicosities: documented reflux has some fibromyalgia and does not like wearing compression stockings stable   3. Thyroid :  On replacement labs with primary   4. Ortho:  Post right bunion  and hammer toe surgery chronic pain   5. Palpitations: benign monitor 10/19/21 normal EF on multiple prior echos she had done for PFO most recent 01/18/20 ECG 10/11/21 normal QT Myovue 02/02/22 normal no ischemia EF 73% Observe   6. GI:  prior Roux-Y gastric bypass complicated by fistula and surgery 09/2023 to repair.    Medication Adjustments/Labs and Tests Ordered: Current medicines are reviewed at length with the patient today.  Concerns regarding medicines are outlined above.   Tests Ordered:  None  Medication Changes: No orders of the defined types were placed in this encounter.  None   Disposition:  Follow up in a  year   Signed, Maude Emmer, MD  10/25/2023 2:20 PM    [facility]

## 2023-10-17 DIAGNOSIS — M79671 Pain in right foot: Secondary | ICD-10-CM | POA: Diagnosis not present

## 2023-10-17 DIAGNOSIS — M5416 Radiculopathy, lumbar region: Secondary | ICD-10-CM | POA: Diagnosis not present

## 2023-10-17 DIAGNOSIS — G894 Chronic pain syndrome: Secondary | ICD-10-CM | POA: Diagnosis not present

## 2023-10-17 DIAGNOSIS — M47816 Spondylosis without myelopathy or radiculopathy, lumbar region: Secondary | ICD-10-CM | POA: Diagnosis not present

## 2023-10-25 ENCOUNTER — Encounter: Payer: Self-pay | Admitting: Cardiovascular Disease

## 2023-10-25 ENCOUNTER — Ambulatory Visit: Attending: Cardiovascular Disease | Admitting: Cardiovascular Disease

## 2023-10-25 VITALS — BP 100/70 | HR 103 | Ht 65.0 in | Wt 228.2 lb

## 2023-10-25 DIAGNOSIS — I493 Ventricular premature depolarization: Secondary | ICD-10-CM | POA: Diagnosis not present

## 2023-10-25 DIAGNOSIS — I8390 Asymptomatic varicose veins of unspecified lower extremity: Secondary | ICD-10-CM | POA: Diagnosis not present

## 2023-10-25 DIAGNOSIS — Q2112 Patent foramen ovale: Secondary | ICD-10-CM | POA: Diagnosis not present

## 2023-10-25 NOTE — Patient Instructions (Signed)
 Medication Instructions:  Your physician recommends that you continue on your current medications as directed. Please refer to the Current Medication list given to you today.   Labwork: None today  Testing/Procedures: None today  Follow-Up: 1 year  Any Other Special Instructions Will Be Listed Below (If Applicable).  If you need a refill on your cardiac medications before your next appointment, please call your pharmacy.

## 2023-10-28 DIAGNOSIS — K316 Fistula of stomach and duodenum: Secondary | ICD-10-CM | POA: Diagnosis not present

## 2023-10-28 DIAGNOSIS — F909 Attention-deficit hyperactivity disorder, unspecified type: Secondary | ICD-10-CM | POA: Diagnosis not present

## 2023-10-28 DIAGNOSIS — Z9884 Bariatric surgery status: Secondary | ICD-10-CM | POA: Diagnosis not present

## 2023-10-28 DIAGNOSIS — Z6838 Body mass index (BMI) 38.0-38.9, adult: Secondary | ICD-10-CM | POA: Diagnosis not present

## 2023-10-28 DIAGNOSIS — M545 Low back pain, unspecified: Secondary | ICD-10-CM | POA: Diagnosis not present

## 2023-10-28 DIAGNOSIS — M159 Polyosteoarthritis, unspecified: Secondary | ICD-10-CM | POA: Diagnosis not present

## 2023-11-01 DIAGNOSIS — K316 Fistula of stomach and duodenum: Secondary | ICD-10-CM | POA: Diagnosis not present

## 2023-11-01 DIAGNOSIS — Z9884 Bariatric surgery status: Secondary | ICD-10-CM | POA: Diagnosis not present

## 2023-11-01 DIAGNOSIS — K912 Postsurgical malabsorption, not elsewhere classified: Secondary | ICD-10-CM | POA: Diagnosis not present

## 2023-11-06 ENCOUNTER — Encounter (INDEPENDENT_AMBULATORY_CARE_PROVIDER_SITE_OTHER): Payer: Self-pay | Admitting: Gastroenterology

## 2023-11-14 DIAGNOSIS — Z79891 Long term (current) use of opiate analgesic: Secondary | ICD-10-CM | POA: Diagnosis not present

## 2023-11-14 DIAGNOSIS — M79671 Pain in right foot: Secondary | ICD-10-CM | POA: Diagnosis not present

## 2023-11-14 DIAGNOSIS — M5416 Radiculopathy, lumbar region: Secondary | ICD-10-CM | POA: Diagnosis not present

## 2023-11-14 DIAGNOSIS — M47816 Spondylosis without myelopathy or radiculopathy, lumbar region: Secondary | ICD-10-CM | POA: Diagnosis not present

## 2023-11-14 DIAGNOSIS — G894 Chronic pain syndrome: Secondary | ICD-10-CM | POA: Diagnosis not present

## 2023-11-15 ENCOUNTER — Other Ambulatory Visit: Payer: Self-pay | Admitting: Neurology

## 2023-11-18 ENCOUNTER — Other Ambulatory Visit: Payer: Self-pay

## 2023-11-18 MED ORDER — TOPIRAMATE 100 MG PO TABS: 100.0000 mg | ORAL_TABLET | Freq: Two times a day (BID) | ORAL | 2 refills | Status: AC

## 2023-11-19 ENCOUNTER — Other Ambulatory Visit: Payer: Self-pay | Admitting: Neurology

## 2023-11-19 DIAGNOSIS — G43909 Migraine, unspecified, not intractable, without status migrainosus: Secondary | ICD-10-CM

## 2023-11-19 NOTE — Telephone Encounter (Signed)
 Last seen on 12/25/22 Follow up scheduled on 12/26/23    Dispensed Days Supply Quantity Provider Pharmacy  EMGALITY  SYRINGE 120 MG/ML SYRINGE (ML) 11/11/2023 30 1 mL [place] Lauraine PARAS, NP [facility]      Too soon to refill

## 2023-11-20 ENCOUNTER — Other Ambulatory Visit: Payer: Self-pay

## 2023-11-20 DIAGNOSIS — G43909 Migraine, unspecified, not intractable, without status migrainosus: Secondary | ICD-10-CM

## 2023-11-20 MED ORDER — EMGALITY 120 MG/ML ~~LOC~~ SOSY: PREFILLED_SYRINGE | SUBCUTANEOUS | 11 refills | Status: DC

## 2023-11-25 ENCOUNTER — Encounter: Payer: Self-pay | Admitting: Radiology

## 2023-12-02 ENCOUNTER — Ambulatory Visit

## 2023-12-02 ENCOUNTER — Ambulatory Visit (INDEPENDENT_AMBULATORY_CARE_PROVIDER_SITE_OTHER)

## 2023-12-02 ENCOUNTER — Ambulatory Visit
Admission: RE | Admit: 2023-12-02 | Discharge: 2023-12-02 | Disposition: A | Discharge status: Home / Self Care | Attending: Family Medicine | Admitting: Family Medicine

## 2023-12-02 ENCOUNTER — Telehealth: Payer: Self-pay

## 2023-12-02 VITALS — BP 109/76 | HR 92 | Temp 97.7°F | Resp 20

## 2023-12-02 DIAGNOSIS — M19072 Primary osteoarthritis, left ankle and foot: Secondary | ICD-10-CM | POA: Diagnosis not present

## 2023-12-02 DIAGNOSIS — M7752 Other enthesopathy of left foot: Secondary | ICD-10-CM | POA: Diagnosis not present

## 2023-12-02 DIAGNOSIS — S82832A Other fracture of upper and lower end of left fibula, initial encounter for closed fracture: Secondary | ICD-10-CM

## 2023-12-02 DIAGNOSIS — S82402A Unspecified fracture of shaft of left fibula, initial encounter for closed fracture: Secondary | ICD-10-CM | POA: Diagnosis not present

## 2023-12-02 NOTE — Discharge Instructions (Addendum)
 Follow up with orthopedics as scheduled.  Remain nonweightbearing in the splint, utilize crutches or a wheelchair to get around until orthopedics advises otherwise.  Elevate at rest to help with swelling.  We will call when your x-ray report comes back from radiology

## 2023-12-02 NOTE — Telephone Encounter (Signed)
 Pt was contacted to be notified of x-ray results, pt verified by two patient identifiers, pt verbalized understanding and has made follow up with [facility].

## 2023-12-02 NOTE — ED Triage Notes (Addendum)
 Pt reports left ankle injury that occurred yesterday, pain and swelling present. Pt is using 2 canes to walk. Tried to manage at home, doing RICE method, and OTC pain relievers  but pain has gotten worse. Pt states she stepped to the side and felt pain. Bruising present.

## 2023-12-02 NOTE — ED Provider Notes (Signed)
 [facility]    CSN: [id number] Arrival date & time: 12/02/23  1355      History   Chief Complaint Chief Complaint  Patient presents with   Ankle Pain    Left lateral ankle sprain vs fractureLed to swelling & purple bruising all the way around lower leg / ankle areaOccurred 12/01/23 ~ 12:40 AM - Entered by patient    HPI Debbie Pineda is a 63 y.o. female.   Patient presenting today with severe pain, bruising, swelling to the left ankle diffusely after sidestepping on a flat surface in her kitchen yesterday causing a crunching sensation in her ankle and immediate severe pain.  She states the pain caused her to fall but the pain happened from the sidestep motion prior to the fall.  Denies complete loss of range of motion though severely painful to do so.  Denies new numbness, tingling, skin injury, head injury from fall, loss of consciousness.  Trying wraps, over-the-counter pain relievers with minimal relief.  Currently on anticoagulation daily.    Past Medical History:  Diagnosis Date   ADD (attention deficit disorder)    Arthritis    all over   Cerebral infarction (HCC) 10/30/2011   Cerebrovascular disease 08/14/2016   Chronic back pain    all over   Chronic low back pain 01/11/2016   Complication of anesthesia    tends to have hypotension when NPO and post-anesthesia   Constipation    takes stool softener daily   Degenerative disk disease    Degenerative joint disease    DVT (deep venous thrombosis) (HCC) 2014   RLE   Family history of adverse reaction to anesthesia    a family member woke up during surgery; think it was my mom   Fibromyalgia    Generalized osteoarthritis of multiple sites 11/04/2013   GERD (gastroesophageal reflux disease)    Headache    Heart palpitations 12/14/2013   resolved   History of blood clots    superficial   Hypoglycemia    Hypothyroid    takes Synthroid  daily   Incomplete emptying of bladder    Insomnia     takes Trazodone  nightly   Iron  deficiency anemia    takes Ferrous Sulfate  daily   Joint pain    Joint swelling    knees and ankles   Memory disorder 08/14/2016   Morbid obesity (HCC)    Nausea    takes Zofran  as needed.Seeing GI doc   Neck pain 05/25/2016   OSA on CPAP    tested more than 5 yrs ago.     Osteoarthritis    Osteopenia    in feet   PFO (patent foramen ovale)    no murmur   PFO (patent foramen ovale)    Pre-diabetes    Primary osteoarthritis of both feet 05/29/2016   Right bunionectomy August 2017 by Dr. Harden   PVC's (premature ventricular contractions) 10/11/2021   Monitor 9/23: 1.8% PVCs; rare PACs; o/w NSR   Scoliosis    Skin abnormality 02/04/2020   raised area on lip   Stroke [facility]) severallast 2014   right foot weakness; memory issues, black spot right visual field since (03/23/2015)   Thrombophlebitis    Trochanteric bursitis of both hips 05/25/2016   Unilateral primary osteoarthritis, right knee 05/29/2016   Urinary urgency 04/19/2016   Vein disorder 11/29/2010   varicose veins both legs   Wears glasses     Patient Active Problem List   Diagnosis Date Noted  Nausea without vomiting 07/02/2023   Encounter for screening colonoscopy 07/02/2023   Belching 03/12/2023   History of seizures 07/10/2022   Melena 05/15/2022   PVC's (premature ventricular contractions) 10/11/2021   Factor V Leiden mutation 10/11/2021   Other secondary scoliosis, lumbar region    Other spondylosis with radiculopathy, lumbar region    Spondylolisthesis, lumbar region    Thoracic spondylosis with radiculopathy    Radiculopathy, lumbar region    Breakdown (mechanical) of implanted electronic neurostimulator of spinal cord electrode (lead), sequela    Degenerative disc disease, lumbar    At risk for adverse drug event 08/31/2021   Acute on chronic blood loss anemia 08/28/2021   Drug-induced constipation 08/28/2021   GERD without esophagitis 08/28/2021   Overactive  bladder 08/28/2021   Chronic migraine without aura 08/28/2021   Hx of blood clots 08/28/2021   Major neurocognitive disorder (HCC) 08/28/2021   Status post lumbar spinal fusion 08/21/2021   Weight gain 07/13/2021   Encounter for removal of internal fixation device 10/21/2020   LFTs abnormal 03/02/2020   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   Gastroesophageal reflux disease 09/22/2019   SSBE (short-segment Barrett's esophagus) 09/22/2019   Flatulence 09/22/2019   Migraine 11/11/2018   Abdominal pain, epigastric 10/14/2018   LUQ pain 10/14/2018   Attention deficit hyperactivity disorder (ADHD) 01/12/2018   Insomnia 01/12/2018   Elevated liver enzymes 09/10/2017   History of diabetes mellitus 06/06/2017   Primary osteoarthritis of right hand 06/06/2017   Transaminasemia    TIA (transient ischemic attack) 05/01/2017   Dysphasia 05/01/2017   Chronic pain syndrome 05/01/2017   Confusion    Presence of right artificial knee joint 09/17/2016   H/O total knee replacement, right 08/15/2016   Presence of retained hardware    Memory disorder 08/14/2016   Cerebrovascular disease 08/14/2016   Unilateral primary osteoarthritis, right knee 05/29/2016   Primary osteoarthritis of both feet 05/29/2016   Trochanteric bursitis of both hips 05/25/2016   Neck pain 05/25/2016   Urinary urgency 04/19/2016   Chronic low back pain 01/11/2016   Total knee replacement status 03/23/2015   Heart palpitations 12/14/2013   Generalized osteoarthritis of multiple sites 11/04/2013   Elevated LFTs 03/17/2012   Cerebral infarction, unspecified (HCC) 11/29/2011   Cerebral infarction (HCC) 10/30/2011   PFO (patent foramen ovale) 10/30/2011   Acquired hypothyroidism    Thrombophlebitis    OSA on CPAP    Fibromyalgia    Status post bariatric surgery 12/07/2010   Vein disorder 11/29/2010   S/P total hysterectomy and bilateral salpingo-oophorectomy 11/29/2010   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   S/P cholecystectomy 11/29/2010   S/P ACL surgery 11/29/2010   Morbid obesity (HCC) 11/10/2010    Past Surgical History:  Procedure Laterality Date   BIOPSY  11/14/2018   Procedure: BIOPSY;  Surgeon: Golda Claudis PENNER, MD;  [facility]: [facility];  Service: Endoscopy;;  esophagusgastric   BIOPSY  06/07/2022   Procedure: BIOPSY;  Surgeon: Eartha Angelia Sieving, MD;  [facility]: [facility];  Service: Gastroenterology;;   BONE EXCISION Right 08/29/2017   Procedure: right trapezium excision;  Surgeon: Murrell Kuba, MD;  Location: [facility];  Service: Orthopedics;  Laterality: Right;   BUNIONECTOMY Right 08/2015   CARDIAC CATHETERIZATION     2008.  it was fine (not sure why she had it done, and doesn't know where)   CARPOMETACARPEL SUSPENSION PLASTY Right 08/29/2017   Procedure: SUSPENSION PLASTY RIGHT THUMB;  Surgeon: Murrell Kuba, MD;  Location: [facility];  Service: Orthopedics;  Laterality: Right;   COLONOSCOPY N/A 03/25/2013   Procedure: COLONOSCOPY;  Surgeon: Claudis RAYMOND Rivet, MD;  [facility]: [facility];  Service: Endoscopy;  Laterality: N/A;  930   ERCP  06/02/2020   ESOPHAGOGASTRODUODENOSCOPY     ESOPHAGOGASTRODUODENOSCOPY (EGD) WITH PROPOFOL  N/A 11/14/2018   Procedure: ESOPHAGOGASTRODUODENOSCOPY (EGD) WITH PROPOFOL ;  Surgeon: Rivet Claudis RAYMOND, MD;  [facility]: [facility];  Service: Endoscopy;  Laterality: N/A;  1:55pm-office moved to 11:00am/pt notified to arrive at 9:30am per KF   ESOPHAGOGASTRODUODENOSCOPY (EGD) WITH PROPOFOL  N/A 06/07/2022   Procedure: ESOPHAGOGASTRODUODENOSCOPY (EGD) WITH PROPOFOL ;  Surgeon: Eartha Angelia Sieving, MD;  [facility]: [facility];  Service: Gastroenterology;  Laterality: N/A;  2:00pm;asa 3   EXPLORATORY LAPAROTOMY     took fallopian tubes out   HALLUX FUSION Right 02/12/2020   Procedure: HALLUX INTERPHANGEAL JOINT  FUSION;  Surgeon: Tobie Franky SQUIBB, DPM;  Location: [facility];  Service: Podiatry;  Laterality: Right;   HAMMER TOE SURGERY Right 02/12/2020   Procedure: SECOND AND THIRD HAMMER TOE CORRECTION; CAPSULOTOMY SECOND INTERPHALANGEAL JOINT;  Surgeon: Tobie Franky SQUIBB, DPM;  Location: [facility];  Service: Podiatry;  Laterality: Right;   JOINT REPLACEMENT     bil knee    KNEE ARTHROSCOPY Left    KNEE ARTHROSCOPY W/ ACL RECONSTRUCTION Right yrs ago   added pins   LAPAROSCOPIC CHOLECYSTECTOMY  ~ 2001   ROUX-EN-Y GASTRIC BYPASS  11/20/2010   [place]   SPINAL CORD STIMULATOR INSERTION N/A 04/18/2017   Procedure: LUMBAR SPINAL CORD STIMULATOR INSERTION;  Surgeon: Mindi Mt, MD;  Location: [facility];  Service: Neurosurgery;  Laterality: N/A;  LUMBAR SPINAL CORD STIMULATOR INSERTION   stomach stent  04/28/2020   TENDON TRANSFER Right 08/29/2017   Procedure: right abductor pollicis longus transfer;  Surgeon: Murrell Kuba, MD;  Location: [facility];  Service: Orthopedics;  Laterality: Right;   TOTAL KNEE ARTHROPLASTY Left 03/23/2015   Procedure: TOTAL KNEE ARTHROPLASTY;  Surgeon: Jerona Harden GAILS, MD;  Location: [facility];  Service: Orthopedics;  Laterality: Left;   TOTAL KNEE ARTHROPLASTY Right 08/15/2016   Procedure: RIGHT TOTAL KNEE ARTHROPLASTY, REMOVAL ACL SCREWS;  Surgeon: Harden Jerona GAILS, MD;  Location: [facility];  Service: Orthopedics;  Laterality: Right;   TOTAL KNEE ARTHROPLASTY WITH HARDWARE REMOVAL Right    UPPER GI ENDOSCOPY  12/06/2022   VAGINAL HYSTERECTOMY     tah/bso   VARICOSE VEIN SURGERY Right X 2   WEIL OSTEOTOMY Right 02/12/2020   Procedure: DOUBLE L OSTEOTOMY;  Surgeon: Tobie Franky SQUIBB, DPM;  Location: [facility];  Service: Podiatry;  Laterality: Right;    OB History     Gravida  2   Para      Term      Preterm      AB  1   Living  1      SAB  1   IAB      Ectopic      Multiple      Live Births               Home Medications    Prior to Admission medications    Medication Sig Start Date End Date Taking? Authorizing Provider  amoxicillin (AMOXIL) 500 MG capsule as needed. Takes when she is going for dental procedures 01/30/23   [provider]  atorvastatin (LIPITOR) 10 MG tablet SMARTSIG:1 Tablet(s) By Mouth Every Evening 06/06/23  [provider]  baclofen  (LIORESAL ) 10 MG tablet Take 1 tablet (10 mg total) by mouth 3 (three) times daily. 08/30/21   Landy Barnie RAMAN, NP  Blood Glucose Monitoring Suppl (FREESTYLE PRECISION NEO SYSTEM) w/Device KIT  11/06/21   [provider]  celecoxib (CELEBREX) 200 MG capsule Take 200 mg by mouth 2 (two) times daily as needed.    [provider]  diclofenac  sodium (VOLTAREN ) 1 % GEL Apply 4 g topically 4 (four) times daily as needed (PAIN). Patient taking differently: Apply 4 g topically as needed (PAIN). 02/06/18   Lucilla Lynwood BRAVO, MD  DULoxetine  (CYMBALTA ) 60 MG capsule Take 60 mg by mouth 2 (two) times daily. 07/01/18   [provider]  estradiol  (ESTRACE ) 1 MG tablet Take 1 mg by mouth daily. 07/03/23   [provider]  famotidine  (PEPCID ) 40 MG tablet Take 1 tablet by mouth at bedtime. 09/16/23   Carlan, Chelsea L, NP  fluconazole (DIFLUCAN) 150 MG tablet Take by mouth as needed. 06/07/23   [provider]  gabapentin  (NEURONTIN ) 800 MG tablet 800 mg. One tid 10/25/14   [provider]  Galcanezumab -gnlm (EMGALITY ) 120 MG/ML SOSY INJECT 1 ML SUBCUTANEOUSLY  ONCE EVERY MONTH 11/20/23   Gayland Lauraine PARAS, NP  levothyroxine  (SYNTHROID ) 125 MCG tablet Take 1 tablet (125 mcg total) by mouth daily before breakfast. 08/30/21   Landy Barnie RAMAN, NP  Methylfol-Methylcob-Acetylcyst (METAFOLBIC PLUS) 6-2-600 MG TABS Take 1 tablet by mouth daily. 08/05/23   Gayland Lauraine PARAS, NP  methylphenidate  (CONCERTA ) 27 MG PO CR tablet Take 1 tablet (27 mg total) by mouth 2 (two) times daily. 10 am & 1400 08/30/21   Landy Barnie RAMAN, NP  metoCLOPramide  (REGLAN ) 5 MG tablet Take 1 tablet  by mouth twice daily. 09/16/23   Carlan, Chelsea L, NP  miconazole (ZEASORB-AF) 2 % powder as needed. Every shift as needed. Apply to red areas in folds of skin.    [provider]  mirabegron  ER (MYRBETRIQ ) 50 MG TB24 tablet Take 1 tablet (50 mg total) by mouth daily. 08/30/21   Landy Barnie RAMAN, NP  naloxegol  oxalate (MOVANTIK ) 25 MG TABS tablet TAKE (1) TABLET BY MOUTH ONCE DAILY. 10/30/22   Carlan, Chelsea L, NP  NARCAN  4 MG/0.1ML LIQD nasal spray kit Place 0.1 sprays (0.4 mg total) into the nose 2 (two) times daily as needed. 08/30/21   Landy Barnie RAMAN, NP  NUCYNTA  ER 100 MG 12 hr tablet SMARTSIG:1 Tablet(s) By Mouth Every 12 Hours 08/05/21   [provider]  omeprazole  (PRILOSEC) 40 MG capsule Take 1 capsule (40 mg total) by mouth 2 (two) times daily. 03/04/23   Carlan, Mitzie CROME, NP  ondansetron  (ZOFRAN -ODT) 4 MG disintegrating tablet Take 1 tablet (4 mg total) by mouth every 8 (eight) hours as needed for nausea or vomiting. 08/30/21   Landy Barnie RAMAN, NP  [facility] ULTRA test strip  11/01/21   [provider]  OneTouch UltraSoft 2 Lancets MISC  11/01/21   [provider]  Oxycodone  HCl 10 MG TABS Take 10 mg by mouth every 6 (six) hours as needed. 12/21/21   [provider]  OZEMPIC, 1 MG/DOSE, 4 MG/3ML SOPN Inject 1 mg into the skin once a week. 05/02/23   [provider]  Rimegepant Sulfate  (NURTEC) 75 MG TBDP DISSOLVE 1 TABLET BY MOUTH ONCE DAILY AS NEEDED, MAX 1 TABLET DAILY. 07/29/23   Gayland Lauraine PARAS, NP  rivaroxaban  (XARELTO ) 20 MG TABS tablet Take 1 tablet (  20 mg total) by mouth daily at 6 PM. 08/30/21   Landy, Barnie RAMAN, NP  tiZANidine  (ZANAFLEX ) 4 MG tablet Take 1 tablet (4 mg total) by mouth every 6 (six) hours as needed for muscle spasms. 08/30/21   Landy Barnie RAMAN, NP  topiramate  (TOPAMAX ) 100 MG tablet Take 1 tablet (100 mg total) by mouth 2 (two) times daily. 11/18/23   Gayland Lauraine PARAS, NP  traZODone  (DESYREL ) 100 MG tablet Take 0.5 tablets  (50 mg total) by mouth at bedtime. 08/30/21   Landy Barnie RAMAN, NP  triamcinolone  cream (KENALOG ) 0.1 % SMARTSIG:1 Application Topical 2-3 Times Daily 11/10/21   [provider]  trospium  (SANCTURA ) 20 MG tablet Take 1 tablet (20 mg total) by mouth 2 (two) times daily. 08/30/21   Landy Barnie RAMAN, NP    Family History Family History  Problem Relation Age of Onset   Cancer Mother        skin and breast   Myasthenia gravis Mother    Breast cancer Mother    Heart disease Father    Cancer Father        brain   Parkinson's disease Father    Heart disease Sister    Heart attack Sister    Heart disease Brother    Cancer Brother    Diabetes Brother    Stroke Brother    Cancer Maternal Grandfather    Hypothyroidism Daughter    Hypertension Other    Colon cancer Neg Hx     Social History Social History   Tobacco Use   Smoking status: Former    Current packs/day: 0.00    Average packs/day: 0.8 packs/day for 8.0 years (6.0 ttl pk-yrs)    Types: Cigarettes    Start date: 12/01/1982    Quit date: 12/01/1990    Years since quitting: 33.0    Passive exposure: Past   Smokeless tobacco: Never  Vaping Use   Vaping status: Never Used  Substance Use Topics   Alcohol  use: No    Comment: 03/23/2015 stopped drinking in 2012 w/gastric bypass; drank socially before bypass   Drug use: No     Allergies   Lyrica [pregabalin], Belsomra [suvorexant], Morphine  and codeine, Sulfamethoxazole-trimethoprim, and Tape   Review of Systems Review of Systems HPI  Physical Exam Triage Vital Signs ED Triage Vitals [12/02/23 1412]  Encounter Vitals Group     BP 109/76     Girls Systolic BP Percentile      Girls Diastolic BP Percentile      Boys Systolic BP Percentile      Boys Diastolic BP Percentile      Pulse Rate 92     Resp 20     Temp 97.7 F (36.5 C)     Temp Source Oral     SpO2 94 %     Weight      Height      Head Circumference      Peak Flow      Pain Score 6     Pain  Loc      Pain Education      Exclude from Growth Chart    No data found.  Updated Vital Signs BP 109/76 (BP Location: Right Arm)   Pulse 92   Temp 97.7 F (36.5 C) (Oral)   Resp 20   SpO2 94%   Visual Acuity Right Eye Distance:   Left Eye Distance:   Bilateral Distance:    Right Eye Near:  Left Eye Near:    Bilateral Near:     Physical Exam Vitals and nursing note reviewed.  Constitutional:      Appearance: Normal appearance. She is not ill-appearing.  HENT:     Head: Atraumatic.  Eyes:     Extraocular Movements: Extraocular movements intact.     Conjunctiva/sclera: Conjunctivae normal.  Cardiovascular:     Rate and Rhythm: Normal rate.  Pulmonary:     Effort: Pulmonary effort is normal.  Musculoskeletal:        General: Swelling, tenderness and signs of injury present.     Cervical back: Normal range of motion and neck supple.     Comments: Unable to bear weight on the left leg due to severe ankle pain.  Diffusely edematous, bruised, tender to palpation across the entire ankle worse laterally.  Skin:    General: Skin is warm and dry.     Findings: Bruising present.  Neurological:     Mental Status: She is alert and oriented to person, place, and time.     Comments: Left lower extremity neurovascularly intact  Psychiatric:        Mood and Affect: Mood normal.        Thought Content: Thought content normal.        Judgment: Judgment normal.      UC Treatments / Results  Labs (all labs ordered are listed, but only abnormal results are displayed) Labs Reviewed - No data to display  EKG   Radiology DG Ankle Complete Left Result Date: 12/02/2023 EXAM: 3 OR MORE VIEW(S) XRAY OF THE LEFT ANKLE 12/02/2023 02:34:42 PM CLINICAL HISTORY: injury that occured yesterday COMPARISON: None available. FINDINGS: BONES AND JOINTS: Mildly displaced fracture of the distal fibula with lateral displacement of the distal fracture fragment. Potential widening of the lateral  aspect of the ankle mortise seen on the oblique projection. Midfoot degenerative changes and plantar calcaneal enthesophyte. No focal osseous lesion. No joint dislocation. SOFT TISSUES: Soft tissue swelling present. IMPRESSION: 1. Mildly displaced distal fibular fracture with lateral displacement of the distal fragment and possible lateral ankle mortise widening. 2. Soft tissue swelling. Electronically signed by: Norleen Boxer MD 12/02/2023 03:19 PM EST RP Workstation: [id number]    Procedures Procedures (including critical care time)  Medications Ordered in UC Medications - No data to display  Initial Impression / Assessment and Plan / UC Course  I have reviewed the triage vital signs and the nursing notes.  Pertinent labs & imaging results that were available during my care of the patient were reviewed by me and considered in my medical decision making (see chart for details).     X-ray of the left ankle personally reviewed, revealing distal fibular fracture.  Patient was placed in a cam boot and given crutches to remain nonweightbearing until she is able to follow-up orthopedics.  Patient called her orthopedist while being fitted for her cam boot and has an appointment to follow-up in 2 days.  Discussed rest, ice, over-the-counter pain relievers as needed.  X-ray then post discharge came back confirming this.  Patient was notified.  Final Clinical Impressions(s) / UC Diagnoses   Final diagnoses:  Closed fracture of distal end of left fibula, unspecified fracture morphology, initial encounter     Discharge Instructions      Follow up with orthopedics as scheduled.  Remain nonweightbearing in the splint, utilize crutches or a wheelchair to get around until orthopedics advises otherwise.  Elevate at rest to help with swelling.  We  will call when your x-ray report comes back from radiology    ED Prescriptions   None    PDMP not reviewed this encounter.   Stuart Vernell Norris,  [place] 12/02/23 1740

## 2023-12-03 ENCOUNTER — Ambulatory Visit (HOSPITAL_COMMUNITY): Payer: Self-pay

## 2023-12-03 ENCOUNTER — Encounter (INDEPENDENT_AMBULATORY_CARE_PROVIDER_SITE_OTHER): Payer: Self-pay | Admitting: Gastroenterology

## 2023-12-04 ENCOUNTER — Ambulatory Visit: Admitting: Sports Medicine

## 2023-12-04 ENCOUNTER — Encounter: Payer: Self-pay | Admitting: Sports Medicine

## 2023-12-04 ENCOUNTER — Other Ambulatory Visit (INDEPENDENT_AMBULATORY_CARE_PROVIDER_SITE_OTHER)

## 2023-12-04 DIAGNOSIS — M25572 Pain in left ankle and joints of left foot: Secondary | ICD-10-CM

## 2023-12-04 DIAGNOSIS — S82832A Other fracture of upper and lower end of left fibula, initial encounter for closed fracture: Secondary | ICD-10-CM | POA: Diagnosis not present

## 2023-12-04 NOTE — Progress Notes (Signed)
 Debbie Pineda - 63 y.o. female MRN [id number]  Date of birth: 1960/05/04  Office Visit Note: Visit Date: 12/04/2023 PCP: Leonce Lucie PARAS, PA-C Referred by: Leonce Lucie PARAS, PA*  Subjective: Chief Complaint  Patient presents with   Left Ankle - Pain   HPI: Debbie Pineda is a pleasant 63 y.o. female who presents today for acute left ankle injury.  DOI: 12/01/23 -patient was walking in the morning and simply stepped wrong in her ankle gave out underneath her.  She had immediate pain and swelling over the lateral ankle.  She went to urgent care, note reviewed from [facility] health urgent care in [place] from 12/02/2023.  X-rays were obtained which showed a distal fibula fracture with mild displacement.  She was placed into a cam walker boot and recommended follow-up with orthopedics.  Pertinent ROS were reviewed with the patient and found to be negative unless otherwise specified above in HPI.   Assessment & Plan: Visit Diagnoses:  1. Other closed fracture of distal end of left fibula, initial encounter   2. Pain in left ankle and joints of left foot    Plan: Impression is acute fracture of the distal fibula, Weber B ankle fracture.  Stress view does show some widening of the ankle mortise, giving underlying arthritic change in patient's comorbidities, I believe she would fare better with nonsurgical treatment.  Is also is a fragility fracture given she was simply stepping and did not have a fall or trauma given the injury.  Given this and the end of her treatment course I would like to get her to [facility] for workup any underlying osteoporosis.  For her acute management, recommend icing the ankle for 20 minutes at least twice daily.  She will remain in her tall cam walker boot at all times, I would like her to be nonweightbearing for the first 2 weeks.  Prescription was written for a knee scooter in the short-term.  In terms of pain control, she is managed on oxycodone  10 mg  every 6 hours as needed by pain management, she will continue this as well as Tylenol  compression and ice for her acute pain.  I will see her back in 2 weeks for repeat x-rays.  At that time as long as she does not have further displacement, we will likely allow her to begin ambulating in her cam walker boot.  Discussed overall course which would likely be about 6-8 weeks of protected immobility to see signs of healing.   Follow-up: Return in about 2 weeks (around 12/18/2023) for Left ankle Fx (repeat XR's).   Meds & Orders: No orders of the defined types were placed in this encounter.   Orders Placed This Encounter  Procedures   XR Ankle 2 Views Left     Procedures: No procedures performed      Clinical History: No specialty comments available.  She reports that she quit smoking about 33 years ago. Her smoking use included cigarettes. She started smoking about 41 years ago. She has a 6 pack-year smoking history. She has been exposed to tobacco smoke. She has never used smokeless tobacco. No results for input(s): HGBA1C, LABURIC in the last 8760 hours.  Objective:    Physical Exam  Gen: Well-appearing, in no acute distress; non-toxic CV: Well-perfused. Warm.  Resp: Breathing unlabored on room air; no wheezing. Psych: Fluid speech in conversation; appropriate affect; normal thought process  Ortho Exam - Left ankle: There is significant swelling and early ecchymosis  over the anterior lateral ankle.  There is quite exquisite tenderness over the lateral fibula.  Cap refill less than 2 seconds.  She is able to wiggle toes.  Patient is nonambulatory in a wheelchair currently.  Imaging: XR Ankle 2 Views Left Result Date: 12/04/2023 2 view x-ray of the left ankle including mortise as well as stress view was ordered and reviewed by myself.  X-rays redemonstrate an oblique mildly displaced fracture of the lateral fibula at the level of the ankle mortise.  Stress view does show widening of  the ankle mortise up to 7.19 mm in nature.  There is rather advanced midfoot and moderate osteoarthritic change at baseline as well.  *Independent review and interpretation of three-view left ankle x-ray from 12/02/2023 was performed by myself today.  AP, mortise and lateral film demonstrate a mildly displaced oblique fracture of the left distal fibula at the level of the ankle mortise.  There is likely some lateral ankle mortise widening.  There is moderate ankle osteoarthritic change and moderate to severe midfoot arthritic change with bony breakdown.  Narrative & Impression  EXAM: 3 OR MORE VIEW(S) XRAY OF THE LEFT ANKLE 12/02/2023 02:34:42 PM   CLINICAL HISTORY: injury that occured yesterday   COMPARISON: None available.   FINDINGS:   BONES AND JOINTS: Mildly displaced fracture of the distal fibula with lateral displacement of the distal fracture fragment. Potential widening of the lateral aspect of the ankle mortise seen on the oblique projection. Midfoot degenerative changes and plantar calcaneal enthesophyte. No focal osseous lesion. No joint dislocation.   SOFT TISSUES: Soft tissue swelling present.   IMPRESSION: 1. Mildly displaced distal fibular fracture with lateral displacement of the distal fragment and possible lateral ankle mortise widening. 2. Soft tissue swelling.   Electronically signed by: Norleen Boxer MD 12/02/2023 03:19 PM EST RP Workstation: [id number]    Past Medical/Family/Surgical/Social History: Medications & Allergies reviewed per EMR, new medications updated. Patient Active Problem List   Diagnosis Date Noted   Nausea without vomiting 07/02/2023   Encounter for screening colonoscopy 07/02/2023   Belching 03/12/2023   History of seizures 07/10/2022   Melena 05/15/2022   PVC's (premature ventricular contractions) 10/11/2021   Factor V Leiden mutation 10/11/2021   Other secondary scoliosis, lumbar region    Other spondylosis with radiculopathy,  lumbar region    Spondylolisthesis, lumbar region    Thoracic spondylosis with radiculopathy    Radiculopathy, lumbar region    Breakdown (mechanical) of implanted electronic neurostimulator of spinal cord electrode (lead), sequela    Degenerative disc disease, lumbar    At risk for adverse drug event 08/31/2021   Acute on chronic blood loss anemia 08/28/2021   Drug-induced constipation 08/28/2021   GERD without esophagitis 08/28/2021   Overactive bladder 08/28/2021   Chronic migraine without aura 08/28/2021   Hx of blood clots 08/28/2021   Major neurocognitive disorder (HCC) 08/28/2021   Status post lumbar spinal fusion 08/21/2021   Weight gain 07/13/2021   Encounter for removal of internal fixation device 10/21/2020   LFTs abnormal 03/02/2020   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   Gastroesophageal reflux disease 09/22/2019   SSBE (short-segment Barrett's esophagus) 09/22/2019   Flatulence 09/22/2019   Migraine 11/11/2018   Abdominal pain, epigastric 10/14/2018   LUQ pain 10/14/2018   Attention deficit hyperactivity disorder (ADHD) 01/12/2018   Insomnia 01/12/2018   Elevated liver enzymes 09/10/2017   History of diabetes mellitus 06/06/2017   Primary osteoarthritis of right hand 06/06/2017   Transaminasemia  TIA (transient ischemic attack) 05/01/2017   Dysphasia 05/01/2017   Chronic pain syndrome 05/01/2017   Confusion    Presence of right artificial knee joint 09/17/2016   H/O total knee replacement, right 08/15/2016   Presence of retained hardware    Memory disorder 08/14/2016   Cerebrovascular disease 08/14/2016   Unilateral primary osteoarthritis, right knee 05/29/2016   Primary osteoarthritis of both feet 05/29/2016   Trochanteric bursitis of both hips 05/25/2016   Neck pain 05/25/2016   Urinary urgency 04/19/2016   Chronic low back pain 01/11/2016   Total knee replacement status 03/23/2015   Heart palpitations 12/14/2013    Generalized osteoarthritis of multiple sites 11/04/2013   Elevated LFTs 03/17/2012   Cerebral infarction, unspecified (HCC) 11/29/2011   Cerebral infarction (HCC) 10/30/2011   PFO (patent foramen ovale) 10/30/2011   Acquired hypothyroidism    Thrombophlebitis    OSA on CPAP    Fibromyalgia    Status post bariatric surgery 12/07/2010   Vein disorder 11/29/2010   S/P total hysterectomy and bilateral salpingo-oophorectomy 11/29/2010   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   S/P cholecystectomy 11/29/2010   S/P ACL surgery 11/29/2010   Morbid obesity (HCC) 11/10/2010   Past Medical History:  Diagnosis Date   ADD (attention deficit disorder)    Arthritis    all over   Cerebral infarction (HCC) 10/30/2011   Cerebrovascular disease 08/14/2016   Chronic back pain    all over   Chronic low back pain 01/11/2016   Complication of anesthesia    tends to have hypotension when NPO and post-anesthesia   Constipation    takes stool softener daily   Degenerative disk disease    Degenerative joint disease    DVT (deep venous thrombosis) (HCC) 2014   RLE   Family history of adverse reaction to anesthesia    a family member woke up during surgery; think it was my mom   Fibromyalgia    Generalized osteoarthritis of multiple sites 11/04/2013   GERD (gastroesophageal reflux disease)    Headache    Heart palpitations 12/14/2013   resolved   History of blood clots    superficial   Hypoglycemia    Hypothyroid    takes Synthroid  daily   Incomplete emptying of bladder    Insomnia    takes Trazodone  nightly   Iron  deficiency anemia    takes Ferrous Sulfate  daily   Joint pain    Joint swelling    knees and ankles   Memory disorder 08/14/2016   Morbid obesity (HCC)    Nausea    takes Zofran  as needed.Seeing GI doc   Neck pain 05/25/2016   OSA on CPAP    tested more than 5 yrs ago.     Osteoarthritis    Osteopenia    in feet   PFO (patent foramen ovale)    no murmur    PFO (patent foramen ovale)    Pre-diabetes    Primary osteoarthritis of both feet 05/29/2016   Right bunionectomy August 2017 by Dr. Harden   PVC's (premature ventricular contractions) 10/11/2021   Monitor 9/23: 1.8% PVCs; rare PACs; o/w NSR   Scoliosis    Skin abnormality 02/04/2020   raised area on lip   Stroke [facility]) severallast 2014   right foot weakness; memory issues, black spot right visual field since (03/23/2015)   Thrombophlebitis    Trochanteric bursitis of both hips 05/25/2016   Unilateral primary osteoarthritis, right knee 05/29/2016   Urinary urgency 04/19/2016  Vein disorder 11/29/2010   varicose veins both legs   Wears glasses    Family History  Problem Relation Age of Onset   Cancer Mother        skin and breast   Myasthenia gravis Mother    Breast cancer Mother    Heart disease Father    Cancer Father        brain   Parkinson's disease Father    Heart disease Sister    Heart attack Sister    Heart disease Brother    Cancer Brother    Diabetes Brother    Stroke Brother    Cancer Maternal Grandfather    Hypothyroidism Daughter    Hypertension Other    Colon cancer Neg Hx    Past Surgical History:  Procedure Laterality Date   BIOPSY  11/14/2018   Procedure: BIOPSY;  Surgeon: Golda Claudis PENNER, MD;  Location: [facility];  Service: Endoscopy;;  esophagusgastric   BIOPSY  06/07/2022   Procedure: BIOPSY;  Surgeon: Eartha Angelia Sieving, MD;  Location: [facility];  Service: Gastroenterology;;   BONE EXCISION Right 08/29/2017   Procedure: right trapezium excision;  Surgeon: Murrell Kuba, MD;  Location: [facility];  Service: Orthopedics;  Laterality: Right;   BUNIONECTOMY Right 08/2015   CARDIAC CATHETERIZATION     2008.  it was fine (not sure why she had it done, and doesn't know where)   CARPOMETACARPEL SUSPENSION PLASTY Right 08/29/2017   Procedure: SUSPENSION PLASTY RIGHT THUMB;  Surgeon: Murrell Kuba, MD;  Location: [facility];  Service: Orthopedics;  Laterality: Right;   COLONOSCOPY N/A 03/25/2013   Procedure: COLONOSCOPY;  Surgeon: Claudis PENNER Golda, MD;  Location: [facility];  Service: Endoscopy;  Laterality: N/A;  930   ERCP  06/02/2020   ESOPHAGOGASTRODUODENOSCOPY     ESOPHAGOGASTRODUODENOSCOPY (EGD) WITH PROPOFOL  N/A 11/14/2018   Procedure: ESOPHAGOGASTRODUODENOSCOPY (EGD) WITH PROPOFOL ;  Surgeon: Golda Claudis PENNER, MD;  Location: [facility];  Service: Endoscopy;  Laterality: N/A;  1:55pm-office moved to 11:00am/pt notified to arrive at 9:30am per KF   ESOPHAGOGASTRODUODENOSCOPY (EGD) WITH PROPOFOL  N/A 06/07/2022   Procedure: ESOPHAGOGASTRODUODENOSCOPY (EGD) WITH PROPOFOL ;  Surgeon: Eartha Angelia Sieving, MD;  Location: [facility];  Service: Gastroenterology;  Laterality: N/A;  2:00pm;asa 3   EXPLORATORY LAPAROTOMY     took fallopian tubes out   HALLUX FUSION Right 02/12/2020   Procedure: HALLUX INTERPHANGEAL JOINT  FUSION;  Surgeon: Tobie Franky SQUIBB, DPM;  Location: [facility];  Service: Podiatry;  Laterality: Right;   HAMMER TOE SURGERY Right 02/12/2020   Procedure: SECOND AND THIRD HAMMER TOE CORRECTION; CAPSULOTOMY SECOND INTERPHALANGEAL JOINT;  Surgeon: Tobie Franky SQUIBB, DPM;  Location: [facility];  Service: Podiatry;  Laterality: Right;   JOINT REPLACEMENT     bil knee    KNEE ARTHROSCOPY Left    KNEE ARTHROSCOPY W/ ACL RECONSTRUCTION Right yrs ago   added pins   LAPAROSCOPIC CHOLECYSTECTOMY  ~ 2001   ROUX-EN-Y GASTRIC BYPASS  11/20/2010   [place]   SPINAL CORD STIMULATOR INSERTION N/A 04/18/2017   Procedure: LUMBAR SPINAL CORD STIMULATOR INSERTION;  Surgeon: Mindi Mt, MD;  Location: [facility];  Service: Neurosurgery;  Laterality: N/A;  LUMBAR SPINAL CORD STIMULATOR INSERTION   stomach stent  04/28/2020   TENDON TRANSFER Right 08/29/2017   Procedure: right abductor pollicis longus transfer;  Surgeon: Murrell Kuba, MD;  Location: [facility];  Service: Orthopedics;  Laterality: Right;  TOTAL KNEE ARTHROPLASTY Left 03/23/2015   Procedure: TOTAL KNEE ARTHROPLASTY;  Surgeon: Jerona Harden GAILS, MD;  Location: [facility];  Service: Orthopedics;  Laterality: Left;   TOTAL KNEE ARTHROPLASTY Right 08/15/2016   Procedure: RIGHT TOTAL KNEE ARTHROPLASTY, REMOVAL ACL SCREWS;  Surgeon: Harden Jerona GAILS, MD;  Location: [facility];  Service: Orthopedics;  Laterality: Right;   TOTAL KNEE ARTHROPLASTY WITH HARDWARE REMOVAL Right    UPPER GI ENDOSCOPY  12/06/2022   VAGINAL HYSTERECTOMY     tah/bso   VARICOSE VEIN SURGERY Right X 2   WEIL OSTEOTOMY Right 02/12/2020   Procedure: DOUBLE L OSTEOTOMY;  Surgeon: Tobie Franky SQUIBB, DPM;  Location: [facility];  Service: Podiatry;  Laterality: Right;   Social History   Occupational History   Occupation: Disability   Occupation: formerly [employer]  Tobacco Use   Smoking status: Former    Current packs/day: 0.00    Average packs/day: 0.8 packs/day for 8.0 years (6.0 ttl pk-yrs)    Types: Cigarettes    Start date: 12/01/1982    Quit date: 12/01/1990    Years since quitting: 33.0    Passive exposure: Past   Smokeless tobacco: Never  Vaping Use   Vaping status: Never Used  Substance and Sexual Activity   Alcohol  use: No    Comment: 03/23/2015 stopped drinking in 2012 w/gastric bypass; drank socially before bypass   Drug use: No   Sexual activity: Not Currently    Birth control/protection: Surgical

## 2023-12-04 NOTE — Progress Notes (Signed)
 Patient says that she hurt her ankle when taking a wrong step early in the morning on Sunday. She says that she elevated and iced the ankle for the day on Sunday, and went to Urgent Care that Monday. She was placed in a boot and has worn it consistently in the time since then. She takes pain medicine already, and has added Tylenol . She is keeping her foot elevated at home, although she has not taken the boot off so she has not used ice. She is here for follow-up today.

## 2023-12-09 ENCOUNTER — Other Ambulatory Visit (HOSPITAL_COMMUNITY): Payer: Self-pay | Admitting: Family Medicine

## 2023-12-09 DIAGNOSIS — S82121D Displaced fracture of lateral condyle of right tibia, subsequent encounter for closed fracture with routine healing: Secondary | ICD-10-CM

## 2023-12-14 ENCOUNTER — Other Ambulatory Visit (INDEPENDENT_AMBULATORY_CARE_PROVIDER_SITE_OTHER): Payer: Self-pay | Admitting: Gastroenterology

## 2023-12-14 DIAGNOSIS — Z9884 Bariatric surgery status: Secondary | ICD-10-CM

## 2023-12-14 DIAGNOSIS — K219 Gastro-esophageal reflux disease without esophagitis: Secondary | ICD-10-CM

## 2023-12-16 ENCOUNTER — Telehealth (INDEPENDENT_AMBULATORY_CARE_PROVIDER_SITE_OTHER): Payer: Self-pay

## 2023-12-16 ENCOUNTER — Other Ambulatory Visit (INDEPENDENT_AMBULATORY_CARE_PROVIDER_SITE_OTHER): Payer: Self-pay

## 2023-12-16 DIAGNOSIS — K219 Gastro-esophageal reflux disease without esophagitis: Secondary | ICD-10-CM

## 2023-12-16 DIAGNOSIS — Z9884 Bariatric surgery status: Secondary | ICD-10-CM

## 2023-12-16 DIAGNOSIS — M47816 Spondylosis without myelopathy or radiculopathy, lumbar region: Secondary | ICD-10-CM | POA: Diagnosis not present

## 2023-12-16 DIAGNOSIS — M5416 Radiculopathy, lumbar region: Secondary | ICD-10-CM | POA: Diagnosis not present

## 2023-12-16 DIAGNOSIS — G894 Chronic pain syndrome: Secondary | ICD-10-CM | POA: Diagnosis not present

## 2023-12-16 DIAGNOSIS — M79671 Pain in right foot: Secondary | ICD-10-CM | POA: Diagnosis not present

## 2023-12-16 NOTE — Telephone Encounter (Signed)
 Patient last seen 07/02/2023

## 2023-12-17 ENCOUNTER — Other Ambulatory Visit: Payer: Self-pay

## 2023-12-17 ENCOUNTER — Encounter: Payer: Self-pay | Admitting: Sports Medicine

## 2023-12-17 ENCOUNTER — Ambulatory Visit: Admitting: Sports Medicine

## 2023-12-17 DIAGNOSIS — S82832D Other fracture of upper and lower end of left fibula, subsequent encounter for closed fracture with routine healing: Secondary | ICD-10-CM | POA: Diagnosis not present

## 2023-12-17 DIAGNOSIS — M25572 Pain in left ankle and joints of left foot: Secondary | ICD-10-CM

## 2023-12-17 NOTE — Progress Notes (Signed)
 Debbie Pineda - 63 y.o. female MRN [id number]  Date of birth: 15-Apr-1960  Office Visit Note: Visit Date: 12/17/2023 PCP: Leonce Lucie PARAS, PA-C Referred by: Leonce Lucie PARAS, PA*  Subjective: Chief Complaint  Patient presents with   Left Ankle - Follow-up   HPI: Debbie Pineda is a pleasant 63 y.o. female who presents today for follow-up of left ankle fracture.  DOI: 12/01/23 -patient was walking in the morning and simply stepped wrong in her ankle gave out underneath her.  She had immediate pain and swelling over the lateral ankle - diagnosed with Fx on x-rays 11/10.  Debbie Pineda is feeling better with her ankle fracture since last visit.  Both her pain and swelling have improved.  She is back to her usual oxycodone  dose 10 mg every 6 hours for her chronic pain.  In her cam walker boot as well is using her knee scooter for ambulation, she has put weight down on the foot several times at home.  No further injury.  Pertinent ROS were reviewed with the patient and found to be negative unless otherwise specified above in HPI.   Assessment & Plan: Visit Diagnoses:  1. Other closed fracture of distal end of left fibula with routine healing, subsequent encounter   2. Pain in left ankle and joints of left foot    Plan: Impression is clinically healing Weber B fracture of the distal fibula, treated nonsurgically.  Having improvement in pain and swelling.  At this point she will continue in her cam walker boot, but will allow her to weight-bear as tolerated in this.  I would like her to take the boot off 3 times daily for active dorsiflexion/plantarflexion motions for mobility only, no strengthening exercises yet.  She will continue cam walker mobility for 1 more month, we will follow-up at that time for repeat x-rays, if showing callus formation, can likely transition her to a regular shoe with a supportive ankle brace as needed.  At that point, we would likely get her started in physical  therapy to help with regaining range of motion and stability/strengthening.  She will continue her oxycodone  10 mg every 6 hours as needed for this and her chronic pain.  Follow-up in 1 month.  Did fit her for a lace-up ankle brace today to wear at nighttime only.  Follow-up: Return in about 31 days (around 01/17/2024) for Left ankle fracture f/u.   Meds & Orders: No orders of the defined types were placed in this encounter.   Orders Placed This Encounter  Procedures   XR Ankle Complete Left     Procedures: No procedures performed      Clinical History: No specialty comments available.  She reports that she quit smoking about 33 years ago. Her smoking use included cigarettes. She started smoking about 41 years ago. She has a 6 pack-year smoking history. She has been exposed to tobacco smoke. She has never used smokeless tobacco. No results for input(s): HGBA1C, LABURIC in the last 8760 hours.  Objective:    Physical Exam  Gen: Well-appearing, in no acute distress; non-toxic CV: Well-perfused. Warm.  Resp: Breathing unlabored on room air; no wheezing. Psych: Fluid speech in conversation; appropriate affect; normal thought process  Ortho Exam - Left ankle: There is mild to moderate swelling over the lateral and anterior ankle although improved from previous visit.  Repeated tenderness over the distal fibula.  There is cap refill less than 2 seconds.  Able to actively dorsiflex and plantarflex  without significant pain.  Imaging: XR Ankle Complete Left Result Date: 12/17/2023 Three-view left ankle x-ray including AP, mortise and lateral film were ordered and reviewed by myself today.  X-rays redemonstrate an oblique fracture through the distal fibula at the level of the ankle mortise.  There is no fracture or displacement or further widening.  No callus formation yet to date.   Past Medical/Family/Surgical/Social History: Medications & Allergies reviewed per EMR, new  medications updated. Patient Active Problem List   Diagnosis Date Noted   Nausea without vomiting 07/02/2023   Encounter for screening colonoscopy 07/02/2023   Belching 03/12/2023   History of seizures 07/10/2022   Melena 05/15/2022   PVC's (premature ventricular contractions) 10/11/2021   Factor V Leiden mutation 10/11/2021   Other secondary scoliosis, lumbar region    Other spondylosis with radiculopathy, lumbar region    Spondylolisthesis, lumbar region    Thoracic spondylosis with radiculopathy    Radiculopathy, lumbar region    Breakdown (mechanical) of implanted electronic neurostimulator of spinal cord electrode (lead), sequela    Degenerative disc disease, lumbar    At risk for adverse drug event 08/31/2021   Acute on chronic blood loss anemia 08/28/2021   Drug-induced constipation 08/28/2021   GERD without esophagitis 08/28/2021   Overactive bladder 08/28/2021   Chronic migraine without aura 08/28/2021   Hx of blood clots 08/28/2021   Major neurocognitive disorder (HCC) 08/28/2021   Status post lumbar spinal fusion 08/21/2021   Weight gain 07/13/2021   Encounter for removal of internal fixation device 10/21/2020   LFTs abnormal 03/02/2020   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   Gastroesophageal reflux disease 09/22/2019   SSBE (short-segment Barrett's esophagus) 09/22/2019   Flatulence 09/22/2019   Migraine 11/11/2018   Abdominal pain, epigastric 10/14/2018   LUQ pain 10/14/2018   Attention deficit hyperactivity disorder (ADHD) 01/12/2018   Insomnia 01/12/2018   Elevated liver enzymes 09/10/2017   History of diabetes mellitus 06/06/2017   Primary osteoarthritis of right hand 06/06/2017   Transaminasemia    TIA (transient ischemic attack) 05/01/2017   Dysphasia 05/01/2017   Chronic pain syndrome 05/01/2017   Confusion    Presence of right artificial knee joint 09/17/2016   H/O total knee replacement, right 08/15/2016   Presence of  retained hardware    Memory disorder 08/14/2016   Cerebrovascular disease 08/14/2016   Unilateral primary osteoarthritis, right knee 05/29/2016   Primary osteoarthritis of both feet 05/29/2016   Trochanteric bursitis of both hips 05/25/2016   Neck pain 05/25/2016   Urinary urgency 04/19/2016   Chronic low back pain 01/11/2016   Total knee replacement status 03/23/2015   Heart palpitations 12/14/2013   Generalized osteoarthritis of multiple sites 11/04/2013   Elevated LFTs 03/17/2012   Cerebral infarction, unspecified (HCC) 11/29/2011   Cerebral infarction (HCC) 10/30/2011   PFO (patent foramen ovale) 10/30/2011   Acquired hypothyroidism    Thrombophlebitis    OSA on CPAP    Fibromyalgia    Status post bariatric surgery 12/07/2010   Vein disorder 11/29/2010   S/P total hysterectomy and bilateral salpingo-oophorectomy 11/29/2010   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   S/P cholecystectomy 11/29/2010   S/P ACL surgery 11/29/2010   Morbid obesity (HCC) 11/10/2010   Past Medical History:  Diagnosis Date   ADD (attention deficit disorder)    Arthritis    all over   Cerebral infarction (HCC) 10/30/2011   Cerebrovascular disease 08/14/2016   Chronic back pain    all over   Chronic low  back pain 01/11/2016   Complication of anesthesia    tends to have hypotension when NPO and post-anesthesia   Constipation    takes stool softener daily   Degenerative disk disease    Degenerative joint disease    DVT (deep venous thrombosis) (HCC) 2014   RLE   Family history of adverse reaction to anesthesia    a family member woke up during surgery; think it was my mom   Fibromyalgia    Generalized osteoarthritis of multiple sites 11/04/2013   GERD (gastroesophageal reflux disease)    Headache    Heart palpitations 12/14/2013   resolved   History of blood clots    superficial   Hypoglycemia    Hypothyroid    takes Synthroid  daily   Incomplete emptying of bladder     Insomnia    takes Trazodone  nightly   Iron  deficiency anemia    takes Ferrous Sulfate  daily   Joint pain    Joint swelling    knees and ankles   Memory disorder 08/14/2016   Morbid obesity (HCC)    Nausea    takes Zofran  as needed.Seeing GI doc   Neck pain 05/25/2016   OSA on CPAP    tested more than 5 yrs ago.     Osteoarthritis    Osteopenia    in feet   PFO (patent foramen ovale)    no murmur   PFO (patent foramen ovale)    Pre-diabetes    Primary osteoarthritis of both feet 05/29/2016   Right bunionectomy August 2017 by Dr. Harden   PVC's (premature ventricular contractions) 10/11/2021   Monitor 9/23: 1.8% PVCs; rare PACs; o/w NSR   Scoliosis    Skin abnormality 02/04/2020   raised area on lip   Stroke [facility]) severallast 2014   right foot weakness; memory issues, black spot right visual field since (03/23/2015)   Thrombophlebitis    Trochanteric bursitis of both hips 05/25/2016   Unilateral primary osteoarthritis, right knee 05/29/2016   Urinary urgency 04/19/2016   Vein disorder 11/29/2010   varicose veins both legs   Wears glasses    Family History  Problem Relation Age of Onset   Cancer Mother        skin and breast   Myasthenia gravis Mother    Breast cancer Mother    Heart disease Father    Cancer Father        brain   Parkinson's disease Father    Heart disease Sister    Heart attack Sister    Heart disease Brother    Cancer Brother    Diabetes Brother    Stroke Brother    Cancer Maternal Grandfather    Hypothyroidism Daughter    Hypertension Other    Colon cancer Neg Hx    Past Surgical History:  Procedure Laterality Date   BIOPSY  11/14/2018   Procedure: BIOPSY;  Surgeon: Golda Claudis PENNER, MD;  [facility]: [facility];  Service: Endoscopy;;  esophagusgastric   BIOPSY  06/07/2022   Procedure: BIOPSY;  Surgeon: Eartha Angelia Sieving, MD;  Location: [facility];  Service: Gastroenterology;;   BONE EXCISION Right 08/29/2017   Procedure:  right trapezium excision;  Surgeon: Murrell Kuba, MD;  Location: [facility];  Service: Orthopedics;  Laterality: Right;   BUNIONECTOMY Right 08/2015   CARDIAC CATHETERIZATION     2008.  it was fine (not sure why she had it done, and doesn't know where)   CARPOMETACARPEL SUSPENSION PLASTY Right  08/29/2017   Procedure: SUSPENSION PLASTY RIGHT THUMB;  Surgeon: Murrell Kuba, MD;  Location: [facility];  Service: Orthopedics;  Laterality: Right;   COLONOSCOPY N/A 03/25/2013   Procedure: COLONOSCOPY;  Surgeon: Claudis RAYMOND Rivet, MD;  Location: [facility];  Service: Endoscopy;  Laterality: N/A;  930   ERCP  06/02/2020   ESOPHAGOGASTRODUODENOSCOPY     ESOPHAGOGASTRODUODENOSCOPY (EGD) WITH PROPOFOL  N/A 11/14/2018   Procedure: ESOPHAGOGASTRODUODENOSCOPY (EGD) WITH PROPOFOL ;  Surgeon: Rivet Claudis RAYMOND, MD;  Location: [facility];  Service: Endoscopy;  Laterality: N/A;  1:55pm-office moved to 11:00am/pt notified to arrive at 9:30am per KF   ESOPHAGOGASTRODUODENOSCOPY (EGD) WITH PROPOFOL  N/A 06/07/2022   Procedure: ESOPHAGOGASTRODUODENOSCOPY (EGD) WITH PROPOFOL ;  Surgeon: Eartha Angelia Sieving, MD;  Location: [facility];  Service: Gastroenterology;  Laterality: N/A;  2:00pm;asa 3   EXPLORATORY LAPAROTOMY     took fallopian tubes out   HALLUX FUSION Right 02/12/2020   Procedure: HALLUX INTERPHANGEAL JOINT  FUSION;  Surgeon: Tobie Franky SQUIBB, DPM;  Location: [facility];  Service: Podiatry;  Laterality: Right;   HAMMER TOE SURGERY Right 02/12/2020   Procedure: SECOND AND THIRD HAMMER TOE CORRECTION; CAPSULOTOMY SECOND INTERPHALANGEAL JOINT;  Surgeon: Tobie Franky SQUIBB, DPM;  Location: [facility];  Service: Podiatry;  Laterality: Right;   JOINT REPLACEMENT     bil knee    KNEE ARTHROSCOPY Left    KNEE ARTHROSCOPY W/ ACL RECONSTRUCTION Right yrs ago   added pins   LAPAROSCOPIC CHOLECYSTECTOMY  ~ 2001   ROUX-EN-Y GASTRIC BYPASS  11/20/2010    [place]   SPINAL CORD STIMULATOR INSERTION N/A 04/18/2017   Procedure: LUMBAR SPINAL CORD STIMULATOR INSERTION;  Surgeon: Mindi Mt, MD;  [facility];  Service: Neurosurgery;  Laterality: N/A;  LUMBAR SPINAL CORD STIMULATOR INSERTION   stomach stent  04/28/2020   TENDON TRANSFER Right 08/29/2017   Procedure: right abductor pollicis longus transfer;  Surgeon: Murrell Kuba, MD;  Location: [facility];  Service: Orthopedics;  Laterality: Right;   TOTAL KNEE ARTHROPLASTY Left 03/23/2015   Procedure: TOTAL KNEE ARTHROPLASTY;  Surgeon: Jerona Harden GAILS, MD;  Location: [facility];  Service: Orthopedics;  Laterality: Left;   TOTAL KNEE ARTHROPLASTY Right 08/15/2016   Procedure: RIGHT TOTAL KNEE ARTHROPLASTY, REMOVAL ACL SCREWS;  Surgeon: Harden Jerona GAILS, MD;  Location: [facility];  Service: Orthopedics;  Laterality: Right;   TOTAL KNEE ARTHROPLASTY WITH HARDWARE REMOVAL Right    UPPER GI ENDOSCOPY  12/06/2022   VAGINAL HYSTERECTOMY     tah/bso   VARICOSE VEIN SURGERY Right X 2   WEIL OSTEOTOMY Right 02/12/2020   Procedure: DOUBLE L OSTEOTOMY;  Surgeon: Tobie Franky SQUIBB, DPM;  Location: [facility];  Service: Podiatry;  Laterality: Right;   Social History   Occupational History   Occupation: Disability   Occupation: formerly [employer]  Tobacco Use   Smoking status: Former    Current packs/day: 0.00    Average packs/day: 0.8 packs/day for 8.0 years (6.0 ttl pk-yrs)    Types: Cigarettes    Start date: 12/01/1982    Quit date: 12/01/1990    Years since quitting: 33.0    Passive exposure: Past   Smokeless tobacco: Never  Vaping Use   Vaping status: Never Used  Substance and Sexual Activity   Alcohol  use: No    Comment: 03/23/2015 stopped drinking in 2012 w/gastric bypass; drank socially before bypass   Drug use: No   Sexual activity: Not Currently  Birth control/protection: Surgical

## 2023-12-17 NOTE — Progress Notes (Signed)
 Patient says that she is feeling better. Both her pain and her swelling have improved. She is still taking Oxycodone , although she takes it for other reasons as well. She has not had to ice the ankle as her pain and swelling have improved. She uses her knee scooter most of the time, although she does put weight on the foot at times when going to the bathroom at home; she is always wearing the boot.

## 2023-12-23 NOTE — Progress Notes (Unsigned)
 Office Visit Note  Patient: Debbie Pineda             Date of Birth: Oct 03, 1960           MRN: [id number]             PCP: Leonce Lucie PARAS, PA-C Referring: Leonce Lucie PARAS, PA-C Visit Date: 01/02/2024 Occupation: Data Unavailable  Subjective:  Medication management  History of Present Illness: Debbie Pineda is a 63 y.o. female with osteoarthritis and fibromyalgia syndrome.  She returns today after her last visit in June 2025.  She continues to have generalized pain and tenderness.  She complains of discomfort lasting all day, no abdominal pain, difficulty climbing stairs and getting up from the chair.  She also gives history of fatigue, dry mouth, dry eyes.  Patient states since November she stepped with her left foot and had sudden sharp pain and then fell.  She states she was seen at an urgent care.  She had x-ray which showed mildly displaced distal fibular fracture with lateral displacement of the distal fragment and possible lateral ankle mortise widening.  She was given a boot followed by a brace.  No surgery was required.  She is still following up with Dr. Burnetta.  A DEXA scan is scheduled which is pending.  She takes Celebrex twice a day.  She also takes baclofen , tizanidine , Cymbalta  and gabapentin . She had menarche at age 7 and total hysterectomy at age 53.  Patient recalls having few cortisone injections over the years.  He has taken prednisone infrequently in the past.  She has been on gabapentin  for approximately 10 years and famotidine  for 10  years.  There is no family history of osteoporosis in her mother.  No history of femur fracture in her mother.    Activities of Daily Living:  Patient reports morning stiffness for all day.  Patient Reports nocturnal pain.  Difficulty dressing/grooming: Reports Difficulty climbing stairs: Reports Difficulty getting out of chair: Reports Difficulty using hands for taps, buttons, cutlery, and/or writing: Denies  Review  of Systems  Constitutional:  Positive for fatigue.  HENT:  Positive for mouth dryness. Negative for mouth sores.   Eyes:  Positive for dryness.  Respiratory:  Negative for shortness of breath.   Cardiovascular:  Negative for chest pain and palpitations.  Gastrointestinal:  Positive for constipation. Negative for blood in stool and diarrhea.  Endocrine: Negative for increased urination.  Genitourinary:  Positive for involuntary urination.  Musculoskeletal:  Positive for joint pain, gait problem, joint pain, joint swelling, myalgias, muscle weakness, morning stiffness, muscle tenderness and myalgias.  Skin:  Positive for hair loss. Negative for color change, rash and sensitivity to sunlight.  Allergic/Immunologic: Negative for susceptible to infections.  Neurological:  Positive for headaches. Negative for dizziness.  Hematological:  Negative for swollen glands.  Psychiatric/Behavioral:  Positive for sleep disturbance. Negative for depressed mood. The patient is not nervous/anxious.     PMFS History:  Patient Active Problem List   Diagnosis Date Noted   Nausea without vomiting 07/02/2023   Encounter for screening colonoscopy 07/02/2023   Belching 03/12/2023   History of seizures 07/10/2022   Melena 05/15/2022   PVC's (premature ventricular contractions) 10/11/2021   Factor V Leiden mutation 10/11/2021   Other secondary scoliosis, lumbar region    Other spondylosis with radiculopathy, lumbar region    Spondylolisthesis, lumbar region    Thoracic spondylosis with radiculopathy    Radiculopathy, lumbar region    Breakdown (mechanical) of  implanted electronic neurostimulator of spinal cord electrode (lead), sequela    Degenerative disc disease, lumbar    At risk for adverse drug event 08/31/2021   Acute on chronic blood loss anemia 08/28/2021   Drug-induced constipation 08/28/2021   GERD without esophagitis 08/28/2021   Overactive bladder 08/28/2021   Chronic migraine without aura  08/28/2021   Hx of blood clots 08/28/2021   Major neurocognitive disorder (HCC) 08/28/2021   Status post lumbar spinal fusion 08/21/2021   Weight gain 07/13/2021   Encounter for removal of internal fixation device 10/21/2020   LFTs abnormal 03/02/2020   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   Gastroesophageal reflux disease 09/22/2019   SSBE (short-segment Barrett's esophagus) 09/22/2019   Flatulence 09/22/2019   Migraine 11/11/2018   Abdominal pain, epigastric 10/14/2018   LUQ pain 10/14/2018   Attention deficit hyperactivity disorder (ADHD) 01/12/2018   Insomnia 01/12/2018   Elevated liver enzymes 09/10/2017   History of diabetes mellitus 06/06/2017   Primary osteoarthritis of right hand 06/06/2017   Transaminasemia    TIA (transient ischemic attack) 05/01/2017   Dysphasia 05/01/2017   Chronic pain syndrome 05/01/2017   Confusion    Presence of right artificial knee joint 09/17/2016   H/O total knee replacement, right 08/15/2016   Presence of retained hardware    Memory disorder 08/14/2016   Cerebrovascular disease 08/14/2016   Unilateral primary osteoarthritis, right knee 05/29/2016   Primary osteoarthritis of both feet 05/29/2016   Trochanteric bursitis of both hips 05/25/2016   Neck pain 05/25/2016   Urinary urgency 04/19/2016   Chronic low back pain 01/11/2016   Total knee replacement status 03/23/2015   Heart palpitations 12/14/2013   Generalized osteoarthritis of multiple sites 11/04/2013   Elevated LFTs 03/17/2012   Cerebral infarction, unspecified (HCC) 11/29/2011   Cerebral infarction (HCC) 10/30/2011   PFO (patent foramen ovale) 10/30/2011   Acquired hypothyroidism    Thrombophlebitis    OSA on CPAP    Fibromyalgia    Status post bariatric surgery 12/07/2010   Vein disorder 11/29/2010   S/P total hysterectomy and bilateral salpingo-oophorectomy 11/29/2010   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   S/P cholecystectomy  11/29/2010   S/P ACL surgery 11/29/2010   Morbid obesity (HCC) 11/10/2010    Past Medical History:  Diagnosis Date   ADD (attention deficit disorder)    Arthritis    all over   Cerebral infarction (HCC) 10/30/2011   Cerebrovascular disease 08/14/2016   Chronic back pain    all over   Chronic low back pain 01/11/2016   Complication of anesthesia    tends to have hypotension when NPO and post-anesthesia   Constipation    takes stool softener daily   Degenerative disk disease    Degenerative joint disease    DVT (deep venous thrombosis) (HCC) 2014   RLE   Family history of adverse reaction to anesthesia    a family member woke up during surgery; think it was my mom   Fibromyalgia    Generalized osteoarthritis of multiple sites 11/04/2013   GERD (gastroesophageal reflux disease)    Headache    Heart palpitations 12/14/2013   resolved   History of blood clots    superficial   Hypoglycemia    Hypothyroid    takes Synthroid  daily   Incomplete emptying of bladder    Insomnia    takes Trazodone  nightly   Iron  deficiency anemia    takes Ferrous Sulfate  daily   Joint pain    Joint swelling  knees and ankles   Memory disorder 08/14/2016   Morbid obesity (HCC)    Nausea    takes Zofran  as needed.Seeing GI doc   Neck pain 05/25/2016   OSA on CPAP    tested more than 5 yrs ago.     Osteoarthritis    Osteopenia    in feet   PFO (patent foramen ovale)    no murmur   PFO (patent foramen ovale)    Pre-diabetes    Primary osteoarthritis of both feet 05/29/2016   Right bunionectomy August 2017 by Dr. Harden   PVC's (premature ventricular contractions) 10/11/2021   Monitor 9/23: 1.8% PVCs; rare PACs; o/w NSR   Scoliosis    Skin abnormality 02/04/2020   raised area on lip   Stroke [facility]) severallast 2014   right foot weakness; memory issues, black spot right visual field since (03/23/2015)   Thrombophlebitis    Trochanteric bursitis of both hips 05/25/2016    Unilateral primary osteoarthritis, right knee 05/29/2016   Urinary urgency 04/19/2016   Vein disorder 11/29/2010   varicose veins both legs   Wears glasses     Family History  Problem Relation Age of Onset   Cancer Mother        skin and breast   Myasthenia gravis Mother    Breast cancer Mother    Heart disease Father    Cancer Father        brain   Parkinson's disease Father    Heart disease Sister    Heart attack Sister    Heart disease Brother    Cancer Brother    Diabetes Brother    Stroke Brother    Cancer Maternal Grandfather    Hypothyroidism Daughter    Hypertension Other    Colon cancer Neg Hx    Past Surgical History:  Procedure Laterality Date   BIOPSY  11/14/2018   Procedure: BIOPSY;  Surgeon: Golda Claudis PENNER, MD;  [facility]: [facility];  Service: Endoscopy;;  esophagusgastric   BIOPSY  06/07/2022   Procedure: BIOPSY;  Surgeon: Eartha Angelia Sieving, MD;  Location: [facility];  Service: Gastroenterology;;   BONE EXCISION Right 08/29/2017   Procedure: right trapezium excision;  Surgeon: Murrell Kuba, MD;  Location: [facility];  Service: Orthopedics;  Laterality: Right;   BUNIONECTOMY Right 08/2015   CARDIAC CATHETERIZATION     2008.  it was fine (not sure why she had it done, and doesn't know where)   CARPOMETACARPEL SUSPENSION PLASTY Right 08/29/2017   Procedure: SUSPENSION PLASTY RIGHT THUMB;  Surgeon: Murrell Kuba, MD;  Location: [facility];  Service: Orthopedics;  Laterality: Right;   COLONOSCOPY N/A 03/25/2013   Procedure: COLONOSCOPY;  Surgeon: Claudis PENNER Golda, MD;  [facility]: [facility];  Service: Endoscopy;  Laterality: N/A;  930   ERCP  06/02/2020   ESOPHAGOGASTRODUODENOSCOPY     ESOPHAGOGASTRODUODENOSCOPY (EGD) WITH PROPOFOL  N/A 11/14/2018   Procedure: ESOPHAGOGASTRODUODENOSCOPY (EGD) WITH PROPOFOL ;  Surgeon: Golda Claudis PENNER, MD;  [facility]: [facility];  Service: Endoscopy;  Laterality: N/A;  1:55pm-office  moved to 11:00am/pt notified to arrive at 9:30am per KF   ESOPHAGOGASTRODUODENOSCOPY (EGD) WITH PROPOFOL  N/A 06/07/2022   Procedure: ESOPHAGOGASTRODUODENOSCOPY (EGD) WITH PROPOFOL ;  Surgeon: Eartha Angelia Sieving, MD;  [facility]: [facility];  Service: Gastroenterology;  Laterality: N/A;  2:00pm;asa 3   EXPLORATORY LAPAROTOMY     took fallopian tubes out   HALLUX FUSION Right 02/12/2020   Procedure: HALLUX INTERPHANGEAL JOINT  FUSION;  Surgeon: Tobie Franky SQUIBB, DPM;  Location: [facility];  Service: Podiatry;  Laterality: Right;   HAMMER TOE SURGERY Right 02/12/2020   Procedure: SECOND AND THIRD HAMMER TOE CORRECTION; CAPSULOTOMY SECOND INTERPHALANGEAL JOINT;  Surgeon: Tobie Franky SQUIBB, DPM;  Location: [facility];  Service: Podiatry;  Laterality: Right;   JOINT REPLACEMENT     bil knee    KNEE ARTHROSCOPY Left    KNEE ARTHROSCOPY W/ ACL RECONSTRUCTION Right yrs ago   added pins   LAPAROSCOPIC CHOLECYSTECTOMY  ~ 2001   ROUX-EN-Y GASTRIC BYPASS  11/20/2010   [place]   ROUX-EN-Y GASTRIC BYPASS  09/25/2023   SPINAL CORD STIMULATOR INSERTION N/A 04/18/2017   Procedure: LUMBAR SPINAL CORD STIMULATOR INSERTION;  Surgeon: Mindi Mt, MD;  Location: [facility];  Service: Neurosurgery;  Laterality: N/A;  LUMBAR SPINAL CORD STIMULATOR INSERTION   stomach stent  04/28/2020   TENDON TRANSFER Right 08/29/2017   Procedure: right abductor pollicis longus transfer;  Surgeon: Murrell Kuba, MD;  Location: [facility];  Service: Orthopedics;  Laterality: Right;   TOTAL KNEE ARTHROPLASTY Left 03/23/2015   Procedure: TOTAL KNEE ARTHROPLASTY;  Surgeon: Jerona Harden GAILS, MD;  Location: [facility];  Service: Orthopedics;  Laterality: Left;   TOTAL KNEE ARTHROPLASTY Right 08/15/2016   Procedure: RIGHT TOTAL KNEE ARTHROPLASTY, REMOVAL ACL SCREWS;  Surgeon: Harden Jerona GAILS, MD;  Location: [facility];  Service: Orthopedics;  Laterality: Right;   TOTAL KNEE ARTHROPLASTY WITH HARDWARE  REMOVAL Right    UPPER GI ENDOSCOPY  12/06/2022   VAGINAL HYSTERECTOMY     tah/bso   VARICOSE VEIN SURGERY Right X 2   WEIL OSTEOTOMY Right 02/12/2020   Procedure: DOUBLE L OSTEOTOMY;  Surgeon: Tobie Franky SQUIBB, DPM;  Location: [facility];  Service: Podiatry;  Laterality: Right;   Social History   Tobacco Use   Smoking status: Former    Current packs/day: 0.00    Average packs/day: 0.8 packs/day for 8.0 years (6.0 ttl pk-yrs)    Types: Cigarettes    Start date: 12/01/1982    Quit date: 12/01/1990    Years since quitting: 33.1    Passive exposure: Past   Smokeless tobacco: Never  Vaping Use   Vaping status: Never Used  Substance Use Topics   Alcohol  use: No    Comment: 03/23/2015 stopped drinking in 2012 w/gastric bypass; drank socially before bypass   Drug use: No   Social History   Social History Narrative   Lives with husband   Caffeine use: No soda   Mainly water , drinks decaf tea   Right handed     Immunization History  Administered Date(s) Administered   Influenza Split 11/01/2011, 08/23/2014   Influenza-Unspecified 08/23/2014, 11/22/2017   Moderna SARS-COV2 Booster Vaccination 12/16/2019, 12/31/2020   Moderna Sars-Covid-2 Vaccination 03/16/2019, 04/04/2019, 04/13/2019, 05/15/2019   Pneumococcal Polysaccharide-23 04/26/2017   Zoster Recombinant(Shingrix) 07/02/2017, 01/08/2018     Objective: Vital Signs: BP 110/70   Pulse 73   Temp 97.6 F (36.4 C)   Resp 15   Ht 5' 5 (1.651 m)   Wt 232 lb (105.2 kg)   BMI 38.61 kg/m    Physical Exam Vitals and nursing note reviewed.  Constitutional:      Appearance: She is well-developed.  HENT:     Head: Normocephalic and atraumatic.  Eyes:     Conjunctiva/sclera: Conjunctivae normal.  Cardiovascular:     Rate and Rhythm: Normal rate and regular rhythm.     Heart sounds:  Normal heart sounds.  Pulmonary:     Effort: Pulmonary effort is normal.     Breath sounds: Normal breath sounds.   Abdominal:     General: Bowel sounds are normal.     Palpations: Abdomen is soft.  Musculoskeletal:     Cervical back: Normal range of motion.  Lymphadenopathy:     Cervical: No cervical adenopathy.  Skin:    General: Skin is warm and dry.     Capillary Refill: Capillary refill takes less than 2 seconds.  Neurological:     Mental Status: She is alert and oriented to person, place, and time.  Psychiatric:        Behavior: Behavior normal.      Musculoskeletal Exam: Cervical, thoracic and lumbar spine were in good range of motion.  There was no SI joint tenderness.  Shoulder joints, elbow joints, wrist joints, MCPs, PIPs and DIPs were in good range of motion with no synovitis.  Hip joints and knee joints were in good range of motion without any warmth swelling or effusion.  There was no tenderness over ankles or MTPs.   CDAI Exam: CDAI Score: -- Patient Global: --; Provider Global: -- Swollen: --; Tender: -- Joint Exam 01/02/2024   No joint exam has been documented for this visit   There is currently no information documented on the homunculus. Go to the Rheumatology activity and complete the homunculus joint exam.  Investigation: No additional findings.  Imaging: XR Ankle Complete Left Result Date: 12/17/2023 Three-view left ankle x-ray including AP, mortise and lateral film were ordered and reviewed by myself today.  X-rays redemonstrate an oblique fracture through the distal fibula at the level of the ankle mortise.  There is no fracture or displacement or further widening.  No callus formation yet to date.  XR Ankle 2 Views Left Result Date: 12/04/2023 2 view x-ray of the left ankle including mortise as well as stress view was ordered and reviewed by myself.  X-rays redemonstrate an oblique mildly displaced fracture of the lateral fibula at the level of the ankle mortise.  Stress view does show widening of the ankle mortise up to 7.19 mm in nature.  There is rather advanced  midfoot and moderate osteoarthritic change at baseline as well.   Recent Labs: Lab Results  Component Value Date   WBC 6.6 03/04/2023   HGB 13.9 03/04/2023   PLT 276 03/04/2023   NA 138 06/06/2022   K 4.1 06/06/2022   CL 108 06/06/2022   CO2 22 06/06/2022   GLUCOSE 73 06/06/2022   BUN 11 06/06/2022   CREATININE 0.98 06/06/2022   BILITOT 0.9 09/06/2021   ALKPHOS 150 (H) 09/06/2021   AST 17 09/06/2021   ALT 13 09/06/2021   PROT 7.1 09/06/2021   ALBUMIN  3.7 09/06/2021   CALCIUM 8.8 (L) 06/06/2022   GFRAA 81 10/14/2018    Speciality Comments: No specialty comments available.  Procedures:  No procedures performed Allergies: Cyclobenzaprine, Pregabalin, Sulfa antibiotics, Buprenorphine, Suvorexant, Sulfamethoxazole-trimethoprim, and Tape   Assessment / Plan:     Visit Diagnoses: Fibromyalgia -she complains of ongoing pain and discomfort. She is under the care of pain management.  She continues to be on the combination of Cymbalta , gabapentin , tizanidine  and baclofen .  Trapezius muscle spasm-she had mild trapezius spasm.  Range of motion exercises were emphasized.  Primary osteoarthritis of both hands-she continues to have some stiffness in her hands.  No synovitis was noted.  Trochanteric bursitis of both hips-I T band stretches were  advised.  She states she has been having some discomfort in the SI joint area since she has been walking in the boot.  History of total bilateral knee replacement-doing well.  She good range of motion of bilateral knee joints.  Other closed fracture of distal end of left tibia with routine healing, subsequent encounter-patient developed left tibia fracture in November and has been under care of Dr. Burnetta.  She is in a walking boot.  It is most likely a stress fracture.  The fracture happened while she stepped on a unlevel surface per patient.  She has hypermobility in her joints which could have contributed to instability in her ankle.  Primary  osteoarthritis of both feet-she has bilateral PIP and DIP thickening with no synovitis.  Degeneration of intervertebral disc of lumbar region without discogenic back pain or lower extremity pain - Surgery by Dr. Lucilla in the past.  She has been seeing Dr. Georgina.  DDD (degenerative disc disease), thoracic-she continues to have chronic pain.  Osteoporosis screening-DEXA scan is pending.  She had recent vitamin D  level which was low normal.  Advised her to take vitamin D  2000 units daily.  Based on her history of possible stress fracture, early menarche, early menopause at age 67, history of gastric bypass, history of long-term use of gabapentin  and antacids should be at increased risk of osteoporosis.  I think Tymlos will be a good choice for her.  She had previous DVT and Evenity would not be a good choice.  I gave her information to review which she can start with her PCP or at our office in the future.  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  No orders of the defined types were placed in this encounter.  Face-to-face time spent with patient was over 30 minutes.  More than 50% time was spent in counseling coronation of care.   Follow-Up Instructions: Return in about 3 months (around 04/01/2024) for Osteoarthritis.   Maya Nash, MD  Note - This record has been created using Animal nutritionist.  Chart creation errors have been sought, but may not always  have been located. Such creation errors do not reflect on  the standard of medical care.

## 2023-12-26 ENCOUNTER — Encounter: Payer: Self-pay | Admitting: Neurology

## 2023-12-26 ENCOUNTER — Ambulatory Visit: Payer: PPO | Admitting: Neurology

## 2023-12-26 VITALS — BP 93/68 | HR 90 | Ht 65.0 in

## 2023-12-26 DIAGNOSIS — Z87898 Personal history of other specified conditions: Secondary | ICD-10-CM

## 2023-12-26 DIAGNOSIS — G43909 Migraine, unspecified, not intractable, without status migrainosus: Secondary | ICD-10-CM | POA: Diagnosis not present

## 2023-12-26 DIAGNOSIS — I679 Cerebrovascular disease, unspecified: Secondary | ICD-10-CM | POA: Diagnosis not present

## 2023-12-26 MED ORDER — EMGALITY 120 MG/ML ~~LOC~~ SOSY: PREFILLED_SYRINGE | SUBCUTANEOUS | 11 refills | Status: AC

## 2023-12-26 MED ORDER — NURTEC 75 MG PO TBDP: ORAL_TABLET | ORAL | 11 refills | Status: AC

## 2023-12-26 NOTE — Patient Instructions (Signed)
 Great to see you today! We will continue current medications I like for you to follow-up in 1 year and see Dr. Gregg to establish care

## 2023-12-26 NOTE — Progress Notes (Signed)
 Patient: Debbie Pineda Date of Birth: 11-Jul-1960  Reason for Visit: Follow up History from: Patient Primary Neurologist: Willis/Chima/Camara  ASSESSMENT AND PLAN 63 y.o. year old female   1.  Chronic migraine headaches -Under good control, continue Emgality  for migraine prevention, continue Nurtec for migraine rescue  2.  Memory loss -Very good stability over time - Last MoCA 29/30, will remain off Aricept   3.  Seizures -Well-controlled, continue Topamax  100 mg twice daily -We discussed this, patient reports on Topamax  for migraine prevention, prior notes from Dr. 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 MTHFR deficiency,  -Remains on Metafolbic Plus  -On Xarelto  due to PFO  We will plan to follow-up in 1 year or sooner if needed, see Dr. Gregg to establish care  HISTORY OF PRESENT ILLNESS: Today 12/26/23 12/27/23 SS: About 1 migraine a month. Still on Emgality , working great! Nurtec works great! No seizures, on Topamax  100 mg BID. Recovering from left fibula fracture. 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 Is down 40 lbs. Memory is better. Drove here, lives with husband. Remains on metafolbic plus.  12/25/22 SS: 1 migraine a month, Emgality  working great. Rarely takes Nurtec, maybe once a month, works great. No seizures, on Topamax  100 mg BID. Stopped Aricept , some trouble with word finding, thinks has gotten some worse. Still has chronic fatigue. Started Ozempic. MOCA 29/30. 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 Uses CPAP religiously.  Has remained on metafolbic since stroke, also on Xarelto  for PFO.  07/10/22 SS: Here today, about 2  migraines a month, remains on Emgality . Tried sample of Ubrelvy , didn't notice any benefit over Nurtec. Nurtec works very well and quickly. Needs refills.   No seizures, still on Topamax  100 mg BID.  Memory is stable, on Aricept  10 mg, has hard time with word finding. Worsens when she is tired. MOCA is 28/30.  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   Sees pain MD for chronic back pain, Fibro. On Oxycodone , Nucynta .   Has been having dark stools, seeing GI. Had been taking Celebrex.   Foot pain is better, on gabapentin  from pain doctor.   HISTORY  Brief HPI: 63 year old female with a history of hypothyroidism, MTHFR deficiency, remote CVA, seizure, migraines, fibromyalgia who follows in clinic for headaches, seizures, foot pain, and memory loss.    At her last visit, CTH was ordered. Emgality  and Nurtec were continued for migraines. Topamax  was continued for headache and seizures. Gabapentin  was continued for foot pain. Donepezil  was continued for memory loss.    Interval History: She continues to have headaches about 1 week before she is due for her Emgality  injection.  Nurtec helps but does not last long.   Memory is doing well on donepezil . She feels like it has improved since her last visit.   Foot pain has improve since her lumbar fusion. She is also getting a shot in her foot tomorrow to see if this will help with her pain.   No new episodes concerning for seizure. Last episode concerning for seizure was 2 years ago.    [facility] 07/04/21 showed her old left occipital infarct with no acute  process. She has not noticed any vision issues since her last visit.   Prior headache therapies: Prevention: Topamax  100 mg BID Cymbalta  60 mg BID Effexor Amitriptyline Gabapentin  800 mg TID Lyrica Aimovig  Ajovy  Emgality  120 mg monthly   Rescue: Lidocaine  patches Tizanidine  4 mg PRN Nurtec 75 mg  PRN Zofran  4 mg PRN  REVIEW OF SYSTEMS: Out of a complete 14 system review of symptoms, the patient complains only of the following symptoms, and all other reviewed systems are negative.  See HPI  ALLERGIES: Allergies  Allergen Reactions   Cyclobenzaprine     NUMBNESS IN ALL EXTREMETIES  Other Reaction(s): Unknown   Pregabalin Shortness Of Breath and Swelling    lower extremity edema and weight gain  Other Reaction(s): sob/ swelling   Sulfa Antibiotics Itching, Rash and Dermatitis    Itching was upper torso   Buprenorphine     Other Reaction(s): Unknown   Suvorexant Other (See Comments)    unknown  Other Reaction(s): Unknown   Morphine  And Codeine Itching    Upper torso   Sulfamethoxazole-Trimethoprim Itching and Rash    Bactrim   Tape Itching and Rash    Please use paper tape  Rash if left on longer than 24 hrs  Other Reaction(s): itching/rash    HOME MEDICATIONS: Outpatient Medications Prior to Visit  Medication Sig Dispense Refill   atorvastatin (LIPITOR) 10 MG tablet SMARTSIG:1 Tablet(s) By Mouth Every Evening     baclofen  (LIORESAL ) 10 MG tablet Take 1 tablet (10 mg total) by mouth 3 (three) times daily. 90 each 0   Blood Glucose Monitoring Suppl (FREESTYLE PRECISION NEO SYSTEM) w/Device KIT      celecoxib (CELEBREX) 200 MG capsule Take 200 mg by mouth 2 (two) times daily as needed.     diclofenac  sodium (VOLTAREN ) 1 % GEL Apply 4 g topically 4 (four) times daily as needed (PAIN). (Patient taking differently: Apply 4 g topically as needed (PAIN).) 5 Tube 1   DULoxetine  (CYMBALTA ) 60 MG capsule Take 60 mg by mouth 2 (two) times daily.     estradiol  (ESTRACE ) 1 MG tablet Take 1 mg by mouth daily.     famotidine  (PEPCID ) 40 MG tablet Take 1 tablet by mouth at bedtime. 90 tablet 3   fluconazole (DIFLUCAN) 150 MG tablet Take by mouth as needed.     gabapentin  (NEURONTIN ) 800 MG tablet 800 mg. One tid     levothyroxine  (SYNTHROID ) 125 MCG tablet Take 1 tablet (125  mcg total) by mouth daily before breakfast. 30 tablet 0   Methylfol-Methylcob-Acetylcyst (METAFOLBIC PLUS) 6-2-600 MG TABS Take 1 tablet by mouth daily. 30 tablet 11   methylphenidate  (CONCERTA ) 27 MG PO CR tablet Take 1 tablet (27 mg total) by mouth 2 (two) times daily. 10 am & 1400 60 tablet 0   metoCLOPramide  (REGLAN ) 5 MG tablet Take 1 tablet by mouth twice daily. 90 tablet 0   miconazole (ZEASORB-AF) 2 % powder as needed. Every shift as needed. Apply to red areas in folds of skin.     mirabegron  ER (MYRBETRIQ ) 50 MG TB24 tablet Take 1 tablet (50 mg total) by mouth daily. 30 tablet 0   naloxegol  oxalate (MOVANTIK ) 25 MG TABS tablet TAKE (1) TABLET BY MOUTH ONCE DAILY. 30 tablet 3   NARCAN  4 MG/0.1ML LIQD nasal spray kit Place 0.1 sprays (0.4 mg total) into the nose 2 (two) times daily as needed. 1 each 0   omeprazole  (PRILOSEC) 40 MG capsule Take 1 capsule (40 mg total)  by mouth 2 (two) times daily. 180 capsule 3   ondansetron  (ZOFRAN -ODT) 4 MG disintegrating tablet Take 1 tablet (4 mg total) by mouth every 8 (eight) hours as needed for nausea or vomiting. 20 tablet 0   ONETOUCH ULTRA test strip      OneTouch UltraSoft 2 Lancets MISC      Oxycodone  HCl 10 MG TABS Take 10 mg by mouth every 6 (six) hours as needed.     OZEMPIC, 1 MG/DOSE, 4 MG/3ML SOPN Inject 1 mg into the skin once a week.     rivaroxaban  (XARELTO ) 20 MG TABS tablet Take 1 tablet (20 mg total) by mouth daily at 6 PM. 30 tablet 0   tiZANidine  (ZANAFLEX ) 4 MG tablet Take 1 tablet (4 mg total) by mouth every 6 (six) hours as needed for muscle spasms. 30 tablet 0   topiramate  (TOPAMAX ) 100 MG tablet Take 1 tablet (100 mg total) by mouth 2 (two) times daily. 180 tablet 2   traZODone  (DESYREL ) 100 MG tablet Take 0.5 tablets (50 mg total) by mouth at bedtime. 15 tablet 0   triamcinolone  cream (KENALOG ) 0.1 % SMARTSIG:1 Application Topical 2-3 Times Daily     trospium  (SANCTURA ) 20 MG tablet Take 1 tablet (20 mg total) by mouth 2 (two)  times daily. 60 tablet 0   Galcanezumab -gnlm (EMGALITY ) 120 MG/ML SOSY INJECT 1 ML SUBCUTANEOUSLY  ONCE EVERY MONTH 1 mL 11   Rimegepant Sulfate  (NURTEC) 75 MG TBDP DISSOLVE 1 TABLET BY MOUTH ONCE DAILY AS NEEDED, MAX 1 TABLET DAILY. 8 tablet 11   amoxicillin (AMOXIL) 500 MG capsule as needed. Takes when she is going for dental procedures     NUCYNTA  ER 100 MG 12 hr tablet SMARTSIG:1 Tablet(s) By Mouth Every 12 Hours     No facility-administered medications prior to visit.    PAST MEDICAL HISTORY: Past Medical History:  Diagnosis Date   ADD (attention deficit disorder)    Arthritis    all over   Cerebral infarction (HCC) 10/30/2011   Cerebrovascular disease 08/14/2016   Chronic back pain    all over   Chronic low back pain 01/11/2016   Complication of anesthesia    tends to have hypotension when NPO and post-anesthesia   Constipation    takes stool softener daily   Degenerative disk disease    Degenerative joint disease    DVT (deep venous thrombosis) (HCC) 2014   RLE   Family history of adverse reaction to anesthesia    a family member woke up during surgery; think it was my mom   Fibromyalgia    Generalized osteoarthritis of multiple sites 11/04/2013   GERD (gastroesophageal reflux disease)    Headache    Heart palpitations 12/14/2013   resolved   History of blood clots    superficial   Hypoglycemia    Hypothyroid    takes Synthroid  daily   Incomplete emptying of bladder    Insomnia    takes Trazodone  nightly   Iron  deficiency anemia    takes Ferrous Sulfate  daily   Joint pain    Joint swelling    knees and ankles   Memory disorder 08/14/2016   Morbid obesity (HCC)    Nausea    takes Zofran  as needed.Seeing GI doc   Neck pain 05/25/2016   OSA on CPAP    tested more than 5 yrs ago.     Osteoarthritis    Osteopenia    in feet   PFO (patent foramen ovale)  no murmur   PFO (patent foramen ovale)    Pre-diabetes    Primary osteoarthritis of both  feet 05/29/2016   Right bunionectomy August 2017 by Dr. Harden   PVC's (premature ventricular contractions) 10/11/2021   Monitor 9/23: 1.8% PVCs; rare PACs; o/w NSR   Scoliosis    Skin abnormality 02/04/2020   raised area on lip   Stroke [facility]) severallast 2014   right foot weakness; memory issues, black spot right visual field since (03/23/2015)   Thrombophlebitis    Trochanteric bursitis of both hips 05/25/2016   Unilateral primary osteoarthritis, right knee 05/29/2016   Urinary urgency 04/19/2016   Vein disorder 11/29/2010   varicose veins both legs   Wears glasses     PAST SURGICAL HISTORY: Past Surgical History:  Procedure Laterality Date   BIOPSY  11/14/2018   Procedure: BIOPSY;  Surgeon: Golda Claudis PENNER, MD;  [facility];  Service: Endoscopy;;  esophagusgastric   BIOPSY  06/07/2022   Procedure: BIOPSY;  Surgeon: Eartha Angelia Sieving, MD;  [facility];  Service: Gastroenterology;;   BONE EXCISION Right 08/29/2017   Procedure: right trapezium excision;  Surgeon: Murrell Kuba, MD;  Location: [facility];  Service: Orthopedics;  Laterality: Right;   BUNIONECTOMY Right 08/2015   CARDIAC CATHETERIZATION     2008.  it was fine (not sure why she had it done, and doesn't know where)   CARPOMETACARPEL SUSPENSION PLASTY Right 08/29/2017   Procedure: SUSPENSION PLASTY RIGHT THUMB;  Surgeon: Murrell Kuba, MD;  Location: [facility];  Service: Orthopedics;  Laterality: Right;   COLONOSCOPY N/A 03/25/2013   Procedure: COLONOSCOPY;  Surgeon: Claudis PENNER Golda, MD;  [facility];  Service: Endoscopy;  Laterality: N/A;  930   ERCP  06/02/2020   ESOPHAGOGASTRODUODENOSCOPY     ESOPHAGOGASTRODUODENOSCOPY (EGD) WITH PROPOFOL  N/A 11/14/2018   Procedure: ESOPHAGOGASTRODUODENOSCOPY (EGD) WITH PROPOFOL ;  Surgeon: Golda Claudis PENNER, MD;  [facility];  Service: Endoscopy;  Laterality: N/A;  1:55pm-office moved to 11:00am/pt  notified to arrive at 9:30am per KF   ESOPHAGOGASTRODUODENOSCOPY (EGD) WITH PROPOFOL  N/A 06/07/2022   Procedure: ESOPHAGOGASTRODUODENOSCOPY (EGD) WITH PROPOFOL ;  Surgeon: Eartha Angelia Sieving, MD;  [facility];  Service: Gastroenterology;  Laterality: N/A;  2:00pm;asa 3   EXPLORATORY LAPAROTOMY     took fallopian tubes out   HALLUX FUSION Right 02/12/2020   Procedure: HALLUX INTERPHANGEAL JOINT  FUSION;  Surgeon: Tobie Franky SQUIBB, DPM;  Location: [facility];  Service: Podiatry;  Laterality: Right;   HAMMER TOE SURGERY Right 02/12/2020   Procedure: SECOND AND THIRD HAMMER TOE CORRECTION; CAPSULOTOMY SECOND INTERPHALANGEAL JOINT;  Surgeon: Tobie Franky SQUIBB, DPM;  Location: [facility];  Service: Podiatry;  Laterality: Right;   JOINT REPLACEMENT     bil knee    KNEE ARTHROSCOPY Left    KNEE ARTHROSCOPY W/ ACL RECONSTRUCTION Right yrs ago   added pins   LAPAROSCOPIC CHOLECYSTECTOMY  ~ 2001   ROUX-EN-Y GASTRIC BYPASS  11/20/2010   [place]   SPINAL CORD STIMULATOR INSERTION N/A 04/18/2017   Procedure: LUMBAR SPINAL CORD STIMULATOR INSERTION;  Surgeon: Mindi Mt, MD;  Location: [facility];  Service: Neurosurgery;  Laterality: N/A;  LUMBAR SPINAL CORD STIMULATOR INSERTION   stomach stent  04/28/2020   TENDON TRANSFER Right 08/29/2017   Procedure: right abductor pollicis longus transfer;  Surgeon: Murrell Kuba, MD;  Location: [facility];  Service: Orthopedics;  Laterality: Right;   TOTAL  KNEE ARTHROPLASTY Left 03/23/2015   Procedure: TOTAL KNEE ARTHROPLASTY;  Surgeon: Jerona Harden GAILS, MD;  Location: [facility];  Service: Orthopedics;  Laterality: Left;   TOTAL KNEE ARTHROPLASTY Right 08/15/2016   Procedure: RIGHT TOTAL KNEE ARTHROPLASTY, REMOVAL ACL SCREWS;  Surgeon: Harden Jerona GAILS, MD;  Location: [facility];  Service: Orthopedics;  Laterality: Right;   TOTAL KNEE ARTHROPLASTY WITH HARDWARE REMOVAL Right    UPPER GI ENDOSCOPY  12/06/2022   VAGINAL  HYSTERECTOMY     tah/bso   VARICOSE VEIN SURGERY Right X 2   WEIL OSTEOTOMY Right 02/12/2020   Procedure: DOUBLE L OSTEOTOMY;  Surgeon: Tobie Franky SQUIBB, DPM;  Location: [facility];  Service: Podiatry;  Laterality: Right;    FAMILY HISTORY: Family History  Problem Relation Age of Onset   Cancer Mother        skin and breast   Myasthenia gravis Mother    Breast cancer Mother    Heart disease Father    Cancer Father        brain   Parkinson's disease Father    Heart disease Sister    Heart attack Sister    Heart disease Brother    Cancer Brother    Diabetes Brother    Stroke Brother    Cancer Maternal Grandfather    Hypothyroidism Daughter    Hypertension Other    Colon cancer Neg Hx     SOCIAL HISTORY: Social History   Socioeconomic History   Marital status: Married    Spouse name: Lynwood   Number of children: 1   Years of education: 14   Highest education level: Not on file  Occupational History   Occupation: Disability   Occupation: formerly [employer]  Tobacco Use   Smoking status: Former    Current packs/day: 0.00    Average packs/day: 0.8 packs/day for 8.0 years (6.0 ttl pk-yrs)    Types: Cigarettes    Start date: 12/01/1982    Quit date: 12/01/1990    Years since quitting: 33.0    Passive exposure: Past   Smokeless tobacco: Never  Vaping Use   Vaping status: Never Used  Substance and Sexual Activity   Alcohol  use: No    Comment: 03/23/2015 stopped drinking in 2012 w/gastric bypass; drank socially before bypass   Drug use: No   Sexual activity: Not Currently    Birth control/protection: Surgical  Other Topics Concern   Not on file  Social History Narrative   Lives with husband   Caffeine use: No soda   Mainly water , drinks decaf tea   Right handed   Social Drivers of Health   Financial Resource Strain: Low Risk (09/26/2023)   Received from [facility]   Overall Financial Resource Strain (CARDIA)    How hard is it for you to pay for  the very basics like food, housing, medical care, and heating?: Not hard at all  Food Insecurity: No Food Insecurity (11/01/2023)   Received from [facility]   Hunger Vital Sign    Within the past 12 months, you worried that your food would run out before you got the money to buy more.: Never true    Within the past 12 months, the food you bought just didn't last and you didn't have money to get more.: Never true  Transportation Needs: No Transportation Needs (11/01/2023)   Received from [facility] - Transportation    Lack of Transportation (Medical): No  Lack of Transportation (Non-Medical): No  Physical Activity: Not on file  Stress: Not on file  Social Connections: Not on file  Intimate Partner Violence: Not At Risk (04/22/2023)   Received from [facility]   Humiliation, Afraid, Rape, and Kick questionnaire    Within the last year, have you been afraid of your partner or ex-partner?: No    Within the last year, have you been humiliated or emotionally abused in other ways by your partner or ex-partner?: No    Within the last year, have you been kicked, hit, slapped, or otherwise physically hurt by your partner or ex-partner?: No    Within the last year, have you been raped or forced to have any kind of sexual activity by your partner or ex-partner?: No    PHYSICAL EXAM  Vitals:   12/26/23 1121  BP: 93/68  Pulse: 90  SpO2: 93%  Height: 5' 5 (1.651 m)     Body mass index is 37.97 kg/m.    12/25/2022   11:19 AM 07/10/2022    3:36 PM 06/29/2021    9:36 AM  Montreal Cognitive Assessment   Visuospatial/ Executive (0/5) 5 5 5   Naming (0/3) 3 3 3   Attention: Read list of digits (0/2) 2 1 2   Attention: Read list of letters (0/1) 1 1 1   Attention: Serial 7 subtraction starting at 100 (0/3) 3 3 2   Language: Repeat phrase (0/2) 2 2 1   Language : Fluency (0/1) 1 0 1  Abstraction (0/2) 2 2 2   Delayed Recall (0/5) 4 5 3   Orientation (0/6) 6 6 6   Total 29 28  26   Adjusted Score (based on education) 29     Generalized: Well developed, in no acute distress  Neurological examination  Mentation: Alert oriented to time, place, history taking. Follows all commands speech and language fluent Cranial nerve II-XII: Pupils were equal round reactive to light. 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 Facial sensation and strength were normal.  Head turning and shoulder shrug were normal and symmetric. Motor: The motor testing reveals 5 over 5 strength of all 4 extremities. Good symmetric motor tone is noted throughout.  Sensory: Sensory testing is intact to soft touch on all 4 extremities. No evidence of extinction is noted.  Coordination: Cerebellar testing reveals good finger-nose-finger and heel-to-shin bilaterally.  Gait and station: Gait is normal, cautious, boot on the left foot  DIAGNOSTIC DATA (LABS, IMAGING, TESTING) - I reviewed patient records, labs, notes, testing and imaging myself where available.  Lab Results  Component Value Date   WBC 6.6 03/04/2023   HGB 13.9 03/04/2023   HCT 43.0 03/04/2023   MCV 96.6 03/04/2023   PLT 276 03/04/2023      Component Value Date/Time   NA 138 06/06/2022 1317   NA 143 10/11/2021 1105   K 4.1 06/06/2022 1317   CL 108 06/06/2022 1317   CO2 22 06/06/2022 1317   GLUCOSE 73 06/06/2022 1317   BUN 11 06/06/2022 1317   BUN 11 10/11/2021 1105   CREATININE 0.98 06/06/2022 1317   CREATININE 0.92 10/14/2018 1302   CALCIUM 8.8 (L) 06/06/2022 1317   PROT 7.1 09/06/2021 1119   ALBUMIN  3.7 09/06/2021 1119   AST 17 09/06/2021 1119   ALT 13 09/06/2021 1119   ALKPHOS 150 (H) 09/06/2021 1119   BILITOT 0.9 09/06/2021 1119   GFRNONAA >60 06/06/2022 1317   GFRNONAA 70 10/14/2018 1302   GFRAA 81  10/14/2018 1302   Lab Results  Component Value Date   CHOL 151 05/02/2017   HDL 74 05/02/2017   LDLCALC 66 05/02/2017   TRIG 54 05/02/2017   CHOLHDL 2.0  05/02/2017   Lab Results  Component Value Date   HGBA1C 5.0 05/02/2017   Lab Results  Component Value Date   VITAMINB12 1,582 (H) 08/14/2016   Lab Results  Component Value Date   TSH 3.700 10/11/2021   Lauraine Born, AGNP-C, DNP 12/26/2023, 1:32 PM [facility] [address], [address] [place], [place] [address] [phone]

## 2024-01-02 ENCOUNTER — Ambulatory Visit: Attending: Orthopedic Surgery | Admitting: Rheumatology

## 2024-01-02 ENCOUNTER — Encounter: Payer: Self-pay | Admitting: Rheumatology

## 2024-01-02 VITALS — BP 110/70 | HR 73 | Temp 97.6°F | Resp 15 | Ht 65.0 in | Wt 232.0 lb

## 2024-01-02 DIAGNOSIS — M62838 Other muscle spasm: Secondary | ICD-10-CM | POA: Insufficient documentation

## 2024-01-02 DIAGNOSIS — M19072 Primary osteoarthritis, left ankle and foot: Secondary | ICD-10-CM

## 2024-01-02 DIAGNOSIS — M7062 Trochanteric bursitis, left hip: Secondary | ICD-10-CM

## 2024-01-02 DIAGNOSIS — Z9884 Bariatric surgery status: Secondary | ICD-10-CM | POA: Insufficient documentation

## 2024-01-02 DIAGNOSIS — M797 Fibromyalgia: Secondary | ICD-10-CM | POA: Diagnosis not present

## 2024-01-02 DIAGNOSIS — M51369 Other intervertebral disc degeneration, lumbar region without mention of lumbar back pain or lower extremity pain: Secondary | ICD-10-CM

## 2024-01-02 DIAGNOSIS — Z96653 Presence of artificial knee joint, bilateral: Secondary | ICD-10-CM | POA: Diagnosis not present

## 2024-01-02 DIAGNOSIS — M19071 Primary osteoarthritis, right ankle and foot: Secondary | ICD-10-CM | POA: Diagnosis not present

## 2024-01-02 DIAGNOSIS — M5134 Other intervertebral disc degeneration, thoracic region: Secondary | ICD-10-CM

## 2024-01-02 DIAGNOSIS — Z8639 Personal history of other endocrine, nutritional and metabolic disease: Secondary | ICD-10-CM

## 2024-01-02 DIAGNOSIS — S82392D Other fracture of lower end of left tibia, subsequent encounter for closed fracture with routine healing: Secondary | ICD-10-CM | POA: Diagnosis not present

## 2024-01-02 DIAGNOSIS — Z13820 Encounter for screening for osteoporosis: Secondary | ICD-10-CM | POA: Insufficient documentation

## 2024-01-02 DIAGNOSIS — Z8672 Personal history of thrombophlebitis: Secondary | ICD-10-CM

## 2024-01-02 DIAGNOSIS — Q2112 Patent foramen ovale: Secondary | ICD-10-CM | POA: Diagnosis not present

## 2024-01-02 DIAGNOSIS — M7061 Trochanteric bursitis, right hip: Secondary | ICD-10-CM | POA: Insufficient documentation

## 2024-01-02 DIAGNOSIS — Z8669 Personal history of other diseases of the nervous system and sense organs: Secondary | ICD-10-CM

## 2024-01-02 DIAGNOSIS — Z8673 Personal history of transient ischemic attack (TIA), and cerebral infarction without residual deficits: Secondary | ICD-10-CM

## 2024-01-02 DIAGNOSIS — M19041 Primary osteoarthritis, right hand: Secondary | ICD-10-CM | POA: Diagnosis not present

## 2024-01-02 DIAGNOSIS — M19042 Primary osteoarthritis, left hand: Secondary | ICD-10-CM

## 2024-01-02 NOTE — Patient Instructions (Signed)
Abaloparatide Injection What is this medication? ABALOPARATIDE (a ball oh PAR a tide) treats osteoporosis. It works by Interior and spatial designer stronger and less likely to break (fracture). This medicine may be used for other purposes; ask your health care provider or pharmacist if you have questions. COMMON BRAND NAME(S): TYMLOS What should I tell my care team before I take this medication? They need to know if you have any of these conditions: Bone disease other than osteoporosis High levels of an enzyme called alkaline phosphatase in the blood High levels of calcium in the blood History of cancer in the bone Kidney stone Paget's disease Parathyroid disease Receiving radiation therapy An unusual or allergic reaction to abaloparatide, other medications, foods, dyes, or preservatives Pregnant or trying to get pregnant Breast-feeding How should I use this medication? This medication is injected under the skin. You will be taught how to prepare and give it. Take it as directed on the prescription label at the same time every day. Keep taking it unless your care team tells you to stop. It is important that you put your used needles and pens in a special sharps container. Do not put them in a trash can. If you do not have a sharps container, call your pharmacist or care team to get one. A special MedGuide will be given to you by the pharmacist with each prescription and refill. Be sure to read this information carefully each time. Talk to your care team about the use of this medication in children. Special care may be needed. Overdosage: If you think you have taken too much of this medicine contact a poison control center or emergency room at once. NOTE: This medicine is only for you. Do not share this medicine with others. What if I miss a dose? If you miss a dose, take it as soon as you can. If it is almost time for your next dose, take only that dose. Do not take double or extra doses. What may  interact with this medication? Interactions have not been studied. This list may not describe all possible interactions. Give your health care provider a list of all the medicines, herbs, non-prescription drugs, or dietary supplements you use. Also tell them if you smoke, drink alcohol, or use illegal drugs. Some items may interact with your medicine. What should I watch for while using this medication? Visit your care team for regular checks on your progress. You may need blood work done while you are taking this medication. This medication may affect your coordination, reaction time, or judgment. Do not drive or operate machinery until you know how this medication affects you. Sit up or stand slowly to reduce the risk of dizzy or fainting spells. Drinking alcohol with this medication can increase the risk of these side effects. You should make sure that you get enough calcium and vitamin D while you are taking this medication. Discuss the foods you eat and the vitamins you take with your care team. Talk to your care team about your risk of cancer. You may be more at risk for certain types of cancers if you take this medication. Do not share pens with anyone, even if the needle is changed. Each pen should only be used by one person. Sharing could cause an infection. What side effects may I notice from receiving this medication? Side effects that you should report to your care team as soon as possible: Allergic reactions--skin rash, itching, hives, swelling of the face, lips, tongue, or throat High  calcium level--increased thirst or amount of urine, nausea, vomiting, confusion, unusual weakness or fatigue, bone pain Kidney stones--blood in the urine, pain or trouble passing urine, pain in the lower back or sides Low blood pressure--dizziness, feeling faint or lightheaded, blurry vision Side effects that usually do not require medical attention (report these to your care team if they continue or are  bothersome): Dizziness Fatigue Headache Nausea Pain, redness, or irritation at injection site This list may not describe all possible side effects. Call your doctor for medical advice about side effects. You may report side effects to FDA at 1-800-FDA-1088. Where should I keep my medication? Keep out of the reach of children and pets. Store unopened pens in the refrigerator between 2 and 8 degrees C (36 and 46 degrees F). Do not freeze. Avoid exposure to heat. Get rid of any unopened medication after the expiration date. After the first use, store your pen for up to 30 days at room temperature between 20 and 25 degrees C (68 and 77 degrees F). Do not store with a needle attached to the pen. Use the pen for only 30 days. Get rid of your pen 30 days after first opening it even if the pen still contains medication. To get rid of medications that are no longer needed or have expired: Take the medication to a medication take-back program. Check with your pharmacy or law enforcement to find a location. If you cannot return the medication, ask your pharmacist or care team how to get rid of this medication safely. NOTE: This sheet is a summary. It may not cover all possible information. If you have questions about this medicine, talk to your doctor, pharmacist, or health care provider.  2024 Elsevier/Gold Standard (2022-04-06 00:00:00)

## 2024-01-03 ENCOUNTER — Encounter: Payer: Self-pay | Admitting: Orthopedic Surgery

## 2024-01-17 ENCOUNTER — Encounter: Payer: Self-pay | Admitting: Sports Medicine

## 2024-01-17 ENCOUNTER — Ambulatory Visit: Admitting: Sports Medicine

## 2024-01-17 ENCOUNTER — Other Ambulatory Visit (INDEPENDENT_AMBULATORY_CARE_PROVIDER_SITE_OTHER): Payer: Self-pay

## 2024-01-17 DIAGNOSIS — M25672 Stiffness of left ankle, not elsewhere classified: Secondary | ICD-10-CM

## 2024-01-17 DIAGNOSIS — M8000XD Age-related osteoporosis with current pathological fracture, unspecified site, subsequent encounter for fracture with routine healing: Secondary | ICD-10-CM | POA: Diagnosis not present

## 2024-01-17 DIAGNOSIS — M25572 Pain in left ankle and joints of left foot: Secondary | ICD-10-CM

## 2024-01-17 DIAGNOSIS — S82832D Other fracture of upper and lower end of left fibula, subsequent encounter for closed fracture with routine healing: Secondary | ICD-10-CM | POA: Diagnosis not present

## 2024-01-17 NOTE — Progress Notes (Signed)
 "  Debbie Pineda - 63 y.o. female MRN [id number]  Date of birth: [birth year]  Office Visit Note: Visit Date: 01/17/2024 PCP: Leonce Lucie PARAS, PA-C Referred by: Leonce Lucie PARAS, PA-C  Subjective: Chief Complaint  Patient presents with   Left Ankle - Follow-up   HPI: Debbie Pineda is a pleasant 63 y.o. female who presents today for follow-up of left ankle fracture.  DOI: 12/01/23 -suffered a Weber B fracture of the distal fibula, treated nonoperatively.  She has been in her cam walker boot but has been coming out of this as instructed 3 times daily for range of motion exercises.  Her pain and swelling has improved although still having some pain and stiffness.  Does wear her ankle brace as well but is somewhat tight over the anterior aspect.  Continues on her chronic oxycodone  10 mg every 6 hours as needed for this and her chronic pain.  She is not following up for anyone specifically for osteoporosis management, but is open to doing so.  She does have a DEXA scan ordered by her PCP for January 7.  Pertinent ROS were reviewed with the patient and found to be negative unless otherwise specified above in HPI.   Assessment & Plan: Visit Diagnoses:  1. Other closed fracture of distal end of left fibula with routine healing, subsequent encounter   2. Pain in left ankle and joints of left foot   3. Ankle stiffness, left   4. Age-related osteoporosis with current pathological fracture with routine healing, subsequent encounter    Plan: Impression is clinically and radiographically healing Weber B fracture of the distal fibula, treated nonsurgically.  At this point, I would like to transition her out of the cam walker boot.  Would like her to wear her lace up ankle brace for support whenever she is ambulating or standing for longer distances.  She may use her cam walker boot only for the next 3-4 weeks if she is walking on uneven ground, to minimize risk of reinjury.  She will continue her  mobility exercises, but I would like to get her started in a few sessions of formalized physical therapy to work on improving ankle stiffness status post her fracture as well as ankle stability, referral sent to [facility].  She will continue her oxycodone  10 mg every 6 hours as needed for this and her chronic pain.  Given this was a fragility fracture, we will refer her to [facility] for osteoporosis work-up and management.  She does have a DEXA scan upcoming on 01/29/2024, recommend following up after this.  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   Follow-up: Return in about 6 weeks (around 02/28/2024) for Left ankle .   Meds & Orders: No orders of the defined types were placed in this encounter.   Orders Placed This Encounter  Procedures   XR Ankle Complete Left   Ambulatory referral to Physical Therapy   Amb Referral to Osteoporosis Management      Procedures: No procedures performed      Clinical History: No specialty comments available.  She reports that she quit smoking about 33 years ago. Her smoking use included cigarettes. She started smoking about 41 years ago. She has a 6 pack-year smoking history. She has been exposed to tobacco smoke. She has never used smokeless tobacco. No results for input(s): HGBA1C, LABURIC in the last 8760 hours.  Objective:   Vital Signs: There were no vitals taken for this visit.  Physical Exam  Gen: Well-appearing, in no acute distress; non-toxic CV: Well-perfused. Warm.  Resp: Breathing unlabored on room air; no wheezing. Psych: Fluid speech in conversation; appropriate affect; normal thought process  Ortho Exam - Left ankle: There is a mild degree of soft tissue swelling over the anterior lateral ankle but significantly improved from previous visits.  There is about 8 to 10 degrees less of ankle dorsiflexion compared to the contralateral side, plantarflexion is near equivocal.  Minimal tenderness over the lateral malleolus and  ankle joint recess.  Cap Refill less than 2 seconds  Imaging: XR Ankle Complete Left Result Date: 01/17/2024 Three-view x-ray of the left ankle including AP, mortise and lateral femoral ordered and reviewed by myself today.  X-rays demonstrate a healing Weber B lateral malleolus fracture without displacement.  There is actually improved congruity of the ankle mortise itself.  Fracture shows signs of healing without complete bony fusion yet.   Past Medical/Family/Surgical/Social History: Medications & Allergies reviewed per EMR, new medications updated. Patient Active Problem List   Diagnosis Date Noted   Nausea without vomiting 07/02/2023   Encounter for screening colonoscopy 07/02/2023   Belching 03/12/2023   History of seizures 07/10/2022   Melena 05/15/2022   PVC's (premature ventricular contractions) 10/11/2021   Factor V Leiden mutation 10/11/2021   Other secondary scoliosis, lumbar region    Other spondylosis with radiculopathy, lumbar region    Spondylolisthesis, lumbar region    Thoracic spondylosis with radiculopathy    Radiculopathy, lumbar region    Breakdown (mechanical) of implanted electronic neurostimulator of spinal cord electrode (lead), sequela    Degenerative disc disease, lumbar    At risk for adverse drug event 08/31/2021   Acute on chronic blood loss anemia 08/28/2021   Drug-induced constipation 08/28/2021   GERD without esophagitis 08/28/2021   Overactive bladder 08/28/2021   Chronic migraine without aura 08/28/2021   Hx of blood clots 08/28/2021   Major neurocognitive disorder (HCC) 08/28/2021   Status post lumbar spinal fusion 08/21/2021   Weight gain 07/13/2021   Encounter for removal of internal fixation device 10/21/2020   LFTs abnormal 03/02/2020   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   Gastroesophageal reflux disease 09/22/2019   SSBE (short-segment Barrett's esophagus) 09/22/2019   Flatulence 09/22/2019   Migraine  11/11/2018   Abdominal pain, epigastric 10/14/2018   LUQ pain 10/14/2018   Attention deficit hyperactivity disorder (ADHD) 01/12/2018   Insomnia 01/12/2018   Elevated liver enzymes 09/10/2017   History of diabetes mellitus 06/06/2017   Primary osteoarthritis of right hand 06/06/2017   Transaminasemia    TIA (transient ischemic attack) 05/01/2017   Dysphasia 05/01/2017   Chronic pain syndrome 05/01/2017   Confusion    Presence of right artificial knee joint 09/17/2016   H/O total knee replacement, right 08/15/2016   Presence of retained hardware    Memory disorder 08/14/2016   Cerebrovascular disease 08/14/2016   Unilateral primary osteoarthritis, right knee 05/29/2016   Primary osteoarthritis of both feet 05/29/2016   Trochanteric bursitis of both hips 05/25/2016   Neck pain 05/25/2016   Urinary urgency 04/19/2016   Chronic low back pain 01/11/2016   Total knee replacement status 03/23/2015   Heart palpitations 12/14/2013   Generalized osteoarthritis of multiple sites 11/04/2013   Elevated LFTs 03/17/2012   Cerebral infarction, unspecified (HCC) 11/29/2011   Cerebral infarction (HCC) 10/30/2011   PFO (patent foramen ovale) 10/30/2011   Acquired hypothyroidism    Thrombophlebitis    OSA on CPAP    Fibromyalgia  Status post bariatric surgery 12/07/2010   Vein disorder 11/29/2010   S/P total hysterectomy and bilateral salpingo-oophorectomy 11/29/2010   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   S/P cholecystectomy 11/29/2010   S/P ACL surgery 11/29/2010   Morbid obesity (HCC) 11/10/2010   Past Medical History:  Diagnosis Date   ADD (attention deficit disorder)    Arthritis    all over   Cerebral infarction (HCC) 10/30/2011   Cerebrovascular disease 08/14/2016   Chronic back pain    all over   Chronic low back pain 01/11/2016   Complication of anesthesia    tends to have hypotension when NPO and post-anesthesia   Constipation    takes stool softener  daily   Degenerative disk disease    Degenerative joint disease    DVT (deep venous thrombosis) (HCC) 2014   RLE   Family history of adverse reaction to anesthesia    a family member woke up during surgery; think it was my mom   Fibromyalgia    Generalized osteoarthritis of multiple sites 11/04/2013   GERD (gastroesophageal reflux disease)    Headache    Heart palpitations 12/14/2013   resolved   History of blood clots    superficial   Hypoglycemia    Hypothyroid    takes Synthroid  daily   Incomplete emptying of bladder    Insomnia    takes Trazodone  nightly   Iron  deficiency anemia    takes Ferrous Sulfate  daily   Joint pain    Joint swelling    knees and ankles   Memory disorder 08/14/2016   Morbid obesity (HCC)    Nausea    takes Zofran  as needed.Seeing GI doc   Neck pain 05/25/2016   OSA on CPAP    tested more than 5 yrs ago.     Osteoarthritis    Osteopenia    in feet   PFO (patent foramen ovale)    no murmur   PFO (patent foramen ovale)    Pre-diabetes    Primary osteoarthritis of both feet 05/29/2016   Right bunionectomy August 2017 by Dr. Harden   PVC's (premature ventricular contractions) 10/11/2021   Monitor 9/23: 1.8% PVCs; rare PACs; o/w NSR   Scoliosis    Skin abnormality 02/04/2020   raised area on lip   Stroke [facility] severallast 2014   right foot weakness; memory issues, black spot right visual field since (03/23/2015)   Thrombophlebitis    Trochanteric bursitis of both hips 05/25/2016   Unilateral primary osteoarthritis, right knee 05/29/2016   Urinary urgency 04/19/2016   Vein disorder 11/29/2010   varicose veins both legs   Wears glasses    Family History  Problem Relation Age of Onset   Cancer Mother        skin and breast   Myasthenia gravis Mother    Breast cancer Mother    Heart disease Father    Cancer Father        brain   Parkinson's disease Father    Heart disease Sister    Heart attack Sister    Heart disease Brother     Cancer Brother    Diabetes Brother    Stroke Brother    Cancer Maternal Grandfather    Hypothyroidism Daughter    Hypertension Other    Colon cancer Neg Hx    Past Surgical History:  Procedure Laterality Date   BIOPSY  11/14/2018   Procedure: BIOPSY;  Surgeon: Golda Claudis PENNER, MD;  [facility]: [facility];  Service: Endoscopy;;  esophagusgastric   BIOPSY  06/07/2022   Procedure: BIOPSY;  Surgeon: Eartha Angelia Sieving, MD;  [facility]: [facility];  Service: Gastroenterology;;   BONE EXCISION Right 08/29/2017   Procedure: right trapezium excision;  Surgeon: Murrell Kuba, MD;  Location: [facility];  Service: Orthopedics;  Laterality: Right;   BUNIONECTOMY Right 08/2015   CARDIAC CATHETERIZATION     2008.  it was fine (not sure why she had it done, and doesn't know where)   CARPOMETACARPEL SUSPENSION PLASTY Right 08/29/2017   Procedure: SUSPENSION PLASTY RIGHT THUMB;  Surgeon: Murrell Kuba, MD;  Location: [facility];  Service: Orthopedics;  Laterality: Right;   COLONOSCOPY N/A 03/25/2013   Procedure: COLONOSCOPY;  Surgeon: Claudis RAYMOND Rivet, MD;  [facility]: [facility];  Service: Endoscopy;  Laterality: N/A;  930   ERCP  06/02/2020   ESOPHAGOGASTRODUODENOSCOPY     ESOPHAGOGASTRODUODENOSCOPY (EGD) WITH PROPOFOL  N/A 11/14/2018   Procedure: ESOPHAGOGASTRODUODENOSCOPY (EGD) WITH PROPOFOL ;  Surgeon: Rivet Claudis RAYMOND, MD;  [facility]: [facility];  Service: Endoscopy;  Laterality: N/A;  1:55pm-office moved to 11:00am/pt notified to arrive at 9:30am per KF   ESOPHAGOGASTRODUODENOSCOPY (EGD) WITH PROPOFOL  N/A 06/07/2022   Procedure: ESOPHAGOGASTRODUODENOSCOPY (EGD) WITH PROPOFOL ;  Surgeon: Eartha Angelia Sieving, MD;  [facility]: [facility];  Service: Gastroenterology;  Laterality: N/A;  2:00pm;asa 3   EXPLORATORY LAPAROTOMY     took fallopian tubes out   HALLUX FUSION Right 02/12/2020   Procedure: HALLUX INTERPHANGEAL JOINT  FUSION;  Surgeon: Tobie Franky SQUIBB, DPM;  Location: [facility];  Service: Podiatry;  Laterality: Right;   HAMMER TOE SURGERY Right 02/12/2020   Procedure: SECOND AND THIRD HAMMER TOE CORRECTION; CAPSULOTOMY SECOND INTERPHALANGEAL JOINT;  Surgeon: Tobie Franky SQUIBB, DPM;  Location: [facility];  Service: Podiatry;  Laterality: Right;   JOINT REPLACEMENT     bil knee    KNEE ARTHROSCOPY Left    KNEE ARTHROSCOPY W/ ACL RECONSTRUCTION Right yrs ago   added pins   LAPAROSCOPIC CHOLECYSTECTOMY  ~ 2001   ROUX-EN-Y GASTRIC BYPASS  11/20/2010   [place]   ROUX-EN-Y GASTRIC BYPASS  09/25/2023   SPINAL CORD STIMULATOR INSERTION N/A 04/18/2017   Procedure: LUMBAR SPINAL CORD STIMULATOR INSERTION;  Surgeon: Mindi Mt, MD;  Location: [facility];  Service: Neurosurgery;  Laterality: N/A;  LUMBAR SPINAL CORD STIMULATOR INSERTION   stomach stent  04/28/2020   TENDON TRANSFER Right 08/29/2017   Procedure: right abductor pollicis longus transfer;  Surgeon: Murrell Kuba, MD;  Location: [facility];  Service: Orthopedics;  Laterality: Right;   TOTAL KNEE ARTHROPLASTY Left 03/23/2015   Procedure: TOTAL KNEE ARTHROPLASTY;  Surgeon: Jerona Harden GAILS, MD;  Location: [facility];  Service: Orthopedics;  Laterality: Left;   TOTAL KNEE ARTHROPLASTY Right 08/15/2016   Procedure: RIGHT TOTAL KNEE ARTHROPLASTY, REMOVAL ACL SCREWS;  Surgeon: Harden Jerona GAILS, MD;  Location: [facility];  Service: Orthopedics;  Laterality: Right;   TOTAL KNEE ARTHROPLASTY WITH HARDWARE REMOVAL Right    UPPER GI ENDOSCOPY  12/06/2022   VAGINAL HYSTERECTOMY     tah/bso   VARICOSE VEIN SURGERY Right X 2   WEIL OSTEOTOMY Right 02/12/2020   Procedure: DOUBLE L OSTEOTOMY;  Surgeon: Tobie Franky SQUIBB, DPM;  Location:  [facility];  Service: Podiatry;  Laterality: Right;   Social History   Occupational History   Occupation: Disability   Occupation: formerly [employer]  Tobacco Use   Smoking status: Former  Current packs/day: 0.00     Average packs/day: 0.8 packs/day for 8.0 years (6.0 ttl pk-yrs)    Types: Cigarettes    Start date: 12/01/1982    Quit date: 12/01/1990    Years since quitting: 33.1    Passive exposure: Past   Smokeless tobacco: Never  Vaping Use   Vaping status: Never Used  Substance and Sexual Activity   Alcohol  use: No    Comment: 03/23/2015 stopped drinking in 2012 w/gastric bypass; drank socially before bypass   Drug use: No   Sexual activity: Not Currently    Birth control/protection: Surgical   "

## 2024-01-17 NOTE — Patient Instructions (Signed)
 Debbie Pineda,  Here are instructions for brace/boot/walking:  - May come out of your cam walker boot only. For the next 1 month, you only need to wear this if you are going to be walking on uneven ground (yard, stones/gravel, etc.)  - For the next 6 weeks, if you are walking out of the house, prolonged standing, etc. I would like you to wear your lace up ankle brace for support.  You do not need to wear this at the house if resting or only taking few steps.  Please be careful going up and down steps and transferring.  - You may use your Ace compression wrap for mild support during these times you are not wearing your ankle lace up brace.  - 6 weeks from now, we will follow-up in early February, at that point you likely may discontinue all of the above as long as everything looks good during her visit  Best, Dr. Burnetta

## 2024-01-29 ENCOUNTER — Ambulatory Visit (HOSPITAL_COMMUNITY)
Admission: RE | Admit: 2024-01-29 | Discharge: 2024-01-29 | Disposition: A | Discharge status: Home / Self Care | Source: Ambulatory Visit | Attending: Family Medicine | Admitting: Family Medicine

## 2024-01-29 DIAGNOSIS — Z780 Asymptomatic menopausal state: Secondary | ICD-10-CM | POA: Insufficient documentation

## 2024-01-29 DIAGNOSIS — X58XXXD Exposure to other specified factors, subsequent encounter: Secondary | ICD-10-CM | POA: Insufficient documentation

## 2024-01-29 DIAGNOSIS — S82121D Displaced fracture of lateral condyle of right tibia, subsequent encounter for closed fracture with routine healing: Secondary | ICD-10-CM | POA: Insufficient documentation

## 2024-02-06 ENCOUNTER — Telehealth (INDEPENDENT_AMBULATORY_CARE_PROVIDER_SITE_OTHER): Payer: Self-pay

## 2024-02-06 ENCOUNTER — Encounter (INDEPENDENT_AMBULATORY_CARE_PROVIDER_SITE_OTHER): Payer: Self-pay | Admitting: Gastroenterology

## 2024-02-06 ENCOUNTER — Ambulatory Visit (INDEPENDENT_AMBULATORY_CARE_PROVIDER_SITE_OTHER): Admitting: Gastroenterology

## 2024-02-06 VITALS — BP 88/64 | HR 89 | Temp 97.3°F | Ht 65.0 in | Wt 232.1 lb

## 2024-02-06 DIAGNOSIS — K5900 Constipation, unspecified: Secondary | ICD-10-CM

## 2024-02-06 DIAGNOSIS — K5903 Drug induced constipation: Secondary | ICD-10-CM

## 2024-02-06 DIAGNOSIS — K219 Gastro-esophageal reflux disease without esophagitis: Secondary | ICD-10-CM | POA: Diagnosis not present

## 2024-02-06 DIAGNOSIS — R112 Nausea with vomiting, unspecified: Secondary | ICD-10-CM | POA: Diagnosis not present

## 2024-02-06 DIAGNOSIS — Z9884 Bariatric surgery status: Secondary | ICD-10-CM

## 2024-02-06 MED ORDER — NA SULFATE-K SULFATE-MG SULF 17.5-3.13-1.6 GM/177ML PO SOLN: ORAL | 0 refills | Status: AC

## 2024-02-06 MED ORDER — METOCLOPRAMIDE HCL 5 MG PO TABS: 5.0000 mg | ORAL_TABLET | Freq: Two times a day (BID) | ORAL | 0 refills | Status: AC | PRN

## 2024-02-06 NOTE — H&P (View-Only) (Signed)
 "  Referring Provider: Leonce Lucie JINNY DEVONNA Primary Care Physician:  Leonce Lucie JINNY, PA-C Primary GI Physician: (previously Dr. Eartha) Dr. Cinderella   Chief Complaint  Patient presents with   Follow-up    Pt arrives for follow up. Pt states we are to be discussing TCS/EGD. Pt has had some nausea and vomiting (water ). Has began over the last 2 weeks. Keeps bowels controlled with medication. No issues with GERD at this time.     HPI:   Debbie Pineda is a 64 y.o. 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 Having repeat Roux en Y on 7/17, GERD well managed on omeprazole  40mg  BID, famotidine  40mg  daily, taking reglan  5mg  BID with good control of nausea, doing movantik , clearlax and 2 docusate 240mg , having 1 BM daily on average. Wanted to hold off on screening colonoscopy  Recommended recall for TCS in October, continue famotidine  40mg  daily, omeprazole  40mg  BID, reglan  5mg  PRN, movantik  25mg  daily, clearlax and docusate  Last labs in October with hgb 14.1, CMP WNL Iron  83 tibc 332 iron  sat 25   Present:  Recently broke her Left ankle, she states she stepped over something and she went down to the ground. Recent DEXA scan was normal.  No surgery needed at this time.   States she is having issues recently after taking a few bites of food where she has to wait a few minutes for food to go down. She has some sudden instances of nausea and water  coming up in her mouth where she has to run to the restroom to vomit. She is choking some on water  but does better with other drinks such as tea. 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 She states symptoms just came on about 1 week ago, she did let her surgeon at [facility] know and they advised her she should have an upper endoscopy and can take gas x for excess gas, they reiterated she has to eat very small amounts of food at a time given small gastric pouch. States she initially was told after her Roux en y that she should not drink out of a bottle but was unsure after this last revision if the same rules responded. She states that she met with dietician and was told this did not matter. She is trying to eat softer foods, doing 2-3 meals per day, she tries to break her meals up into smaller portions and eat part of it then wait a while and eat the rest.   She is taking movantik , 240mg  docusate nightly but clearlax daily, doing well with this regimen, having normal BMs.    laparoscopic gastric bypass revision involving robotic gastrogastric fistula takedown and small bowel resection (candycane limb) and partial remnant stomach gastric resection from prior RYGB 09/2023:  Surgical Pathology Diagnosis  A: Small bowel, partial resection - Portion of being small bowel with blind pouch containing embedded staples and serosal adhesions, consistent with blind end of Roux  limb - No dysplasia or malignancy identified - Margin viable  B: Stomach, partial gastrectomy - Benign portion of stomach with distorted anatomy, diffuse superficial chronic gastritis, and patchy ulcerations (clinical gastro-gastric fistula) - No Helicobacter organisms identified by H&E stain - No intestinal metaplasia, dysplasia, or carcinoma identified - Resection margins viable   EGD: 03/2023 Normal esophagus.                         - Gastric bypass with a  normal-sized pouch.                         Gastrojejunal anastomosis characterized by healthy                         appearing mucosa.                         - Persistent gastrogastrostomy fistula. Not amenable                         to endoscopic closure.                         - Normal duodenal bulb, second portion of the duodenum                         and major papilla. No residual adenomatous changes                         seen at the prior ampullectomy site.                         - No specimens collected. (Referred to surgery for surgical repair of fistula)           Last Colonoscopy: march 2015, normal, small eternal hemorrhoids repeat in 10 years CT A/P with contrast 09/06/21 Prominent stool throughout the colon favors constipation. 2. Other imaging findings of potential clinical significance: Small right renal angiomyolipoma. Pneumobilia. Prior cholecystectomy. Prior gastric bypass. Extensive postoperative findings in the thoracolumbar spine.   ERCP: 06/02/20: No specimens collected.  - Gastric bypass. Gastrojejunal anastomosis characterized by healthy appearing mucosa. - Pre-existing gastrogastrostomy AXIOS stent. Removed and replaced with two 10Fr x 4cm double pigtail stents to maintain patency (given the anticipated need for  ampullary surveillance in the future). - A single ampullary polyp (adenomatous) Snare papillectomy of the  major papilla was performed. Resected and retrieved  with hemostasis achieved through the use of snare tip soft coagulation. - The entire main bile duct was mildly dilated. acquired. - A biliary sphincterotomy was performed.  - The biliary tree was swept and sludge was found. (Recommended repeat EGD in 6-12 months, pending path)           Filed Weights   02/06/24 1103  Weight: 232 lb 1.6 oz (105.3 kg)     Past Medical History:  Diagnosis Date   ADD (attention deficit disorder)    Arthritis    all over   Cerebral infarction (HCC) 10/30/2011    Cerebrovascular disease 08/14/2016   Chronic back pain    all over   Chronic low back pain 01/11/2016   Complication of anesthesia    tends to have hypotension when NPO and post-anesthesia  Constipation    takes stool softener daily   Degenerative disk disease    Degenerative joint disease    DVT (deep venous thrombosis) (HCC) 2014   RLE   Family history of adverse reaction to anesthesia    a family member woke up during surgery; think it was my mom   Fibromyalgia    Generalized osteoarthritis of multiple sites 11/04/2013   GERD (gastroesophageal reflux disease)    Headache    Heart palpitations 12/14/2013   resolved   History of blood clots    superficial   Hypoglycemia    Hypothyroid    takes Synthroid  daily   Incomplete emptying of bladder    Insomnia    takes Trazodone  nightly   Iron  deficiency anemia    takes Ferrous Sulfate  daily   Joint pain    Joint swelling    knees and ankles   Memory disorder 08/14/2016   Morbid obesity (HCC)    Nausea    takes Zofran  as needed.Seeing GI doc   Neck pain 05/25/2016   OSA on CPAP    tested more than 5 yrs ago.     Osteoarthritis    Osteopenia    in feet   PFO (patent foramen ovale)    no murmur   PFO (patent foramen ovale)    Pre-diabetes    Primary osteoarthritis of both feet 05/29/2016   Right bunionectomy August 2017 by Dr. Harden   PVC's (premature ventricular contractions) 10/11/2021   Monitor 9/23: 1.8% PVCs; rare PACs; o/w NSR   Scoliosis    Skin abnormality 02/04/2020   raised area on lip   Stroke [facility]) severallast 2014   right foot weakness; memory issues, black spot right visual field since (03/23/2015)   Thrombophlebitis    Trochanteric bursitis of both hips 05/25/2016   Unilateral primary osteoarthritis, right knee 05/29/2016   Urinary urgency 04/19/2016   Vein disorder 11/29/2010   varicose veins both legs   Wears glasses     Past Surgical History:  Procedure Laterality Date   BIOPSY   11/14/2018   Procedure: BIOPSY;  Surgeon: Golda Claudis PENNER, MD;  [facility];  Service: Endoscopy;;  esophagusgastric   BIOPSY  06/07/2022   Procedure: BIOPSY;  Surgeon: Eartha Angelia Sieving, MD;  [facility];  Service: Gastroenterology;;   BONE EXCISION Right 08/29/2017   Procedure: right trapezium excision;  Surgeon: Murrell Kuba, MD;  Location: [facility];  Service: Orthopedics;  Laterality: Right;   BUNIONECTOMY Right 08/2015   CARDIAC CATHETERIZATION     2008.  it was fine (not sure why she had it done, and doesn't know where)   CARPOMETACARPEL SUSPENSION PLASTY Right 08/29/2017   Procedure: SUSPENSION PLASTY RIGHT THUMB;  Surgeon: Murrell Kuba, MD;  Location: [facility];  Service: Orthopedics;  Laterality: Right;   COLONOSCOPY N/A 03/25/2013   Procedure: COLONOSCOPY;  Surgeon: Claudis PENNER Golda, MD;  [facility];  Service: Endoscopy;  Laterality: N/A;  930   ERCP  06/02/2020   ESOPHAGOGASTRODUODENOSCOPY     ESOPHAGOGASTRODUODENOSCOPY (EGD) WITH PROPOFOL  N/A 11/14/2018   Procedure: ESOPHAGOGASTRODUODENOSCOPY (EGD) WITH PROPOFOL ;  Surgeon: Golda Claudis PENNER, MD;  [facility];  Service: Endoscopy;  Laterality: N/A;  1:55pm-office moved to 11:00am/pt notified to arrive at 9:30am per KF   ESOPHAGOGASTRODUODENOSCOPY (EGD) WITH PROPOFOL  N/A 06/07/2022   Procedure: ESOPHAGOGASTRODUODENOSCOPY (EGD) WITH PROPOFOL ;  Surgeon: Eartha Angelia Sieving, MD;  [facility];  Service: Gastroenterology;  Laterality: N/A;  2:00pm;asa 3   EXPLORATORY LAPAROTOMY     took fallopian tubes out   HALLUX FUSION Right 02/12/2020   Procedure: HALLUX INTERPHANGEAL JOINT  FUSION;  Surgeon: Tobie Franky SQUIBB, DPM;  Location: [facility];  Service: Podiatry;  Laterality: Right;   HAMMER TOE SURGERY Right 02/12/2020   Procedure: SECOND AND THIRD HAMMER TOE CORRECTION; CAPSULOTOMY SECOND INTERPHALANGEAL JOINT;   Surgeon: Tobie Franky SQUIBB, DPM;  Location: [facility];  Service: Podiatry;  Laterality: Right;   JOINT REPLACEMENT     bil knee    KNEE ARTHROSCOPY Left    KNEE ARTHROSCOPY W/ ACL RECONSTRUCTION Right yrs ago   added pins   LAPAROSCOPIC CHOLECYSTECTOMY  ~ 2001   ROUX-EN-Y GASTRIC BYPASS  11/20/2010   [place]   ROUX-EN-Y GASTRIC BYPASS  09/25/2023   SPINAL CORD STIMULATOR INSERTION N/A 04/18/2017   Procedure: LUMBAR SPINAL CORD STIMULATOR INSERTION;  Surgeon: Mindi Mt, MD;  [facility];  Service: Neurosurgery;  Laterality: N/A;  LUMBAR SPINAL CORD STIMULATOR INSERTION   stomach stent  04/28/2020   TENDON TRANSFER Right 08/29/2017   Procedure: right abductor pollicis longus transfer;  Surgeon: Murrell Kuba, MD;  Location: [facility];  Service: Orthopedics;  Laterality: Right;   TOTAL KNEE ARTHROPLASTY Left 03/23/2015   Procedure: TOTAL KNEE ARTHROPLASTY;  Surgeon: Jerona Harden GAILS, MD;  Location: [facility];  Service: Orthopedics;  Laterality: Left;   TOTAL KNEE ARTHROPLASTY Right 08/15/2016   Procedure: RIGHT TOTAL KNEE ARTHROPLASTY, REMOVAL ACL SCREWS;  Surgeon: Harden Jerona GAILS, MD;  Location: [facility];  Service: Orthopedics;  Laterality: Right;   TOTAL KNEE ARTHROPLASTY WITH HARDWARE REMOVAL Right    UPPER GI ENDOSCOPY  12/06/2022   VAGINAL HYSTERECTOMY     tah/bso   VARICOSE VEIN SURGERY Right X 2   WEIL OSTEOTOMY Right 02/12/2020   Procedure: DOUBLE L OSTEOTOMY;  Surgeon: Tobie Franky SQUIBB, DPM;  Location: [facility];  Service: Podiatry;  Laterality: Right;    Current Outpatient Medications  Medication Sig Dispense Refill   atorvastatin (LIPITOR) 10 MG tablet SMARTSIG:1 Tablet(s) By Mouth Every Evening     baclofen  (LIORESAL ) 10 MG tablet Take 1 tablet (10 mg total) by mouth 3 (three) times daily. 90 each 0   Blood Glucose Monitoring Suppl (FREESTYLE PRECISION NEO SYSTEM) w/Device KIT      celecoxib (CELEBREX) 200 MG capsule Take 200 mg by  mouth 2 (two) times daily as needed. (Patient taking differently: Take 200 mg by mouth 2 (two) times daily.)     diclofenac  sodium (VOLTAREN ) 1 % GEL Apply 4 g topically 4 (four) times daily as needed (PAIN). (Patient taking differently: Apply 4 g topically as needed (PAIN).) 5 Tube 1   DULoxetine  (CYMBALTA ) 60 MG capsule Take 60 mg by mouth 2 (two) times daily.     estradiol  (ESTRACE ) 1 MG tablet Take 1 mg by mouth daily.     famotidine  (PEPCID ) 40 MG tablet Take 1 tablet by mouth at bedtime. 90 tablet 3   fluconazole (DIFLUCAN) 150 MG tablet Take by mouth as needed.     gabapentin  (NEURONTIN ) 800 MG tablet 800 mg. One tid     Galcanezumab -gnlm (EMGALITY ) 120 MG/ML SOSY INJECT 1 ML SUBCUTANEOUSLY  ONCE EVERY MONTH 1 mL 11   levothyroxine  (SYNTHROID ) 125 MCG tablet Take 1 tablet (125 mcg total) by mouth daily before breakfast. 30 tablet 0   Methylfol-Methylcob-Acetylcyst (METAFOLBIC PLUS) 6-2-600 MG TABS Take 1 tablet by  mouth daily. 30 tablet 11   methylphenidate  (CONCERTA ) 27 MG PO CR tablet Take 1 tablet (27 mg total) by mouth 2 (two) times daily. 10 am & 1400 60 tablet 0   metoCLOPramide  (REGLAN ) 5 MG tablet Take 1 tablet by mouth twice daily. 90 tablet 0   mirabegron  ER (MYRBETRIQ ) 50 MG TB24 tablet Take 1 tablet (50 mg total) by mouth daily. 30 tablet 0   naloxegol  oxalate (MOVANTIK ) 25 MG TABS tablet TAKE (1) TABLET BY MOUTH ONCE DAILY. 30 tablet 3   NARCAN  4 MG/0.1ML LIQD nasal spray kit Place 0.1 sprays (0.4 mg total) into the nose 2 (two) times daily as needed. 1 each 0   omeprazole  (PRILOSEC) 40 MG capsule Take 1 capsule (40 mg total) by mouth 2 (two) times daily. 180 capsule 3   ondansetron  (ZOFRAN -ODT) 4 MG disintegrating tablet Take 1 tablet (4 mg total) by mouth every 8 (eight) hours as needed for nausea or vomiting. 20 tablet 0   ONETOUCH ULTRA test strip      OneTouch UltraSoft 2 Lancets MISC      Oxycodone  HCl 10 MG TABS Take 10 mg by mouth every 6 (six) hours as needed.      OZEMPIC, 1 MG/DOSE, 4 MG/3ML SOPN Inject 1 mg into the skin once a week.     Rimegepant Sulfate  (NURTEC) 75 MG TBDP DISSOLVE 1 TABLET BY MOUTH ONCE DAILY AS NEEDED, MAX 1 TABLET DAILY. 8 tablet 11   rivaroxaban  (XARELTO ) 20 MG TABS tablet Take 1 tablet (20 mg total) by mouth daily at 6 PM. 30 tablet 0   tiZANidine  (ZANAFLEX ) 4 MG tablet Take 1 tablet (4 mg total) by mouth every 6 (six) hours as needed for muscle spasms. 30 tablet 0   topiramate  (TOPAMAX ) 100 MG tablet Take 1 tablet (100 mg total) by mouth 2 (two) times daily. 180 tablet 2   traZODone  (DESYREL ) 100 MG tablet Take 0.5 tablets (50 mg total) by mouth at bedtime. 15 tablet 0   triamcinolone  cream (KENALOG ) 0.1 % SMARTSIG:1 Application Topical 2-3 Times Daily     trospium  (SANCTURA ) 20 MG tablet Take 1 tablet (20 mg total) by mouth 2 (two) times daily. 60 tablet 0   miconazole (ZEASORB-AF) 2 % powder as needed. Every shift as needed. Apply to red areas in folds of skin. (Patient not taking: Reported on 02/06/2024)     No current facility-administered medications for this visit.    Allergies as of 02/06/2024 - Review Complete 02/06/2024  Allergen Reaction Noted   Cyclobenzaprine  03/10/2015   Pregabalin Shortness Of Breath and Swelling 12/01/2015   Sulfa antibiotics Itching, Rash, and Dermatitis 10/26/2010   Buprenorphine  12/26/2023   Suvorexant Other (See Comments) 12/01/2015   Sulfamethoxazole-trimethoprim Itching and Rash 03/10/2015   Tape Itching and Rash 03/10/2015    Social History   Socioeconomic History   Marital status: Married    Spouse name: Lynwood   Number of children: 1   Years of education: 14   Highest education level: Not on file  Occupational History   Occupation: Disability   Occupation: formerly [employer]  Tobacco Use   Smoking status: Former    Current packs/day: 0.00    Average packs/day: 0.8 packs/day for 8.0 years (6.0 ttl pk-yrs)    Types: Cigarettes    Start date: 12/01/1982    Quit date:  12/01/1990    Years since quitting: 33.2    Passive exposure: Past   Smokeless tobacco: Never  Vaping  Use   Vaping status: Never Used  Substance and Sexual Activity   Alcohol  use: No    Comment: 03/23/2015 stopped drinking in 2012 w/gastric bypass; drank socially before bypass   Drug use: No   Sexual activity: Not Currently    Birth control/protection: Surgical  Other Topics Concern   Not on file  Social History Narrative   Lives with husband   Caffeine use: No soda   Mainly water , drinks decaf tea   Right handed   Social Drivers of Health   Tobacco Use: Medium Risk (02/06/2024)   Patient History    Smoking Tobacco Use: Former    Smokeless Tobacco Use: Never    Passive Exposure: Past  Physicist, Medical Strain: Low Risk (09/26/2023)   Received from [facility]   Overall Financial Resource Strain (CARDIA)    How hard is it for you to pay for the very basics like food, housing, medical care, and heating?: Not hard at all  Food Insecurity: No Food Insecurity (11/01/2023)   Received from [facility]   Epic    Within the past 12 months, you worried that your food would run out before you got the money to buy more.: Never true    Within the past 12 months, the food you bought just didn't last and you didn't have money to get more.: Never true  Transportation Needs: No Transportation Needs (11/01/2023)   Received from [facility] - Transportation    Lack of Transportation (Medical): No    Lack of Transportation (Non-Medical): No  Physical Activity: Not on file  Stress: Not on file  Social Connections: Not on file  Depression (EYV7-0): Not on file  Alcohol  Screen: Not on file  Housing: Not on file  Utilities: Low Risk (11/01/2023)   Received from [facility]   Utilities    Within the past 12 months, have you been unable to get utilities(heat, electricity) when it was really needed?: No  Health Literacy: Not on file    Review of systems General:  negative for malaise, night sweats, fever, chills, weight loss Neck: Negative for lumps, goiter, pain and significant neck swelling Resp: Negative for cough, wheezing, dyspnea at rest CV: Negative for chest pain, leg swelling, palpitations, orthopnea GI: denies melena, hematochezia, diarrhea, constipation, dysphagia, odyonophagia, early satiety or unintentional weight loss. +nausea/vomiting  MSK: Negative for joint pain or swelling, back pain, and muscle pain. Derm: Negative for itching or rash Psych: Denies depression, anxiety, memory loss, confusion. No homicidal or suicidal ideation.  Heme: Negative for prolonged bleeding, bruising easily, and swollen nodes. Endocrine: Negative for cold or heat intolerance, polyuria, polydipsia and goiter. Neuro: negative for tremor, gait imbalance, syncope and seizures. The remainder of the review of systems is noncontributory.  Physical Exam: BP (!) 88/64   Pulse 89   Temp (!) 97.3 F (36.3 C)   Ht 5' 5 (1.651 m)   Wt 232 lb 1.6 oz (105.3 kg)   BMI 38.62 kg/m  General:   Alert and oriented. No distress noted. Pleasant and cooperative.  Head:  Normocephalic and atraumatic. Eyes:  Conjuctiva clear without scleral icterus. Mouth:  Oral mucosa pink and moist. Good dentition. No lesions. Heart: Normal rate and rhythm, s1 and s2 heart sounds present.  Lungs: Clear lung sounds in all lobes. Respirations equal and unlabored. Abdomen:  +BS, soft, non-tender and non-distended. No rebound or guarding. No HSM or masses noted. Derm: No palmar erythema or jaundice Msk:  Symmetrical without gross deformities. Normal posture. Extremities:  Without edema. Neurologic:  Alert and  oriented x4 Psych:  Alert and cooperative. Normal mood and affect.  Invalid input(s): 6 MONTHS   ASSESSMENT: Debbie Pineda is a 64 y.o. female presenting today for follow up of nausea/vomiting, constipation and GERD  GERD: well managed on omeprazole  40mg  BID and famotidine   40mg  daily. Will continue with current regimen  Constipation: well managed with movantik , clearlax and docusate. Will continue with current regimen  Nausea/vomiting: previously well managed with reglan  5mg  BID. Recent laparoscopic gastric bypass revision involving robotic gastrogastric fistula takedown and small bowel resection (candycane limb) and partial remnant stomach gastric resection from prior RYGB in September. She notes over the past week she has had more nausea and vomiting with eating. Trying to do smaller meals but feels as if she is able to eat only a few bites and has to stop. She reached out to the surgery team at [facility] who recommended she have EGD locally for further evaluation. For now can continue with reglan  PRN, small meals, will get her scheduled for EGD for further evaluation.  She is overdue for screening colonscopy, at this time, she is agreeable to pursue this at time of EGD as she feels she can tolerate smaller volume prep.   Indications, risks and benefits of procedure discussed in detail with patient. Patient verbalized understanding and is in agreement to proceed with EGD/Colonoscopy.    PLAN:  -schedule colonoscopy and EGD, ASA III, hold xarelto  without clearance, needs small volume prep -continue famotidine  40mg  at bedtime -Continue reglan  5mg , try to limit this to PRN basis -continue Omeprazole  40mg  BID  -continue movantik  25mg , clearlax and docusate BID bowel regimen daily  -Increase water  intake, aim for atleast 64 oz per day -Increase fruits, veggies and whole grains, kiwi and prunes are especially good for constipation  All questions were answered, patient verbalized understanding and is in agreement with plan as outlined above.   Follow Up: 6 months   Karan Ramnauth L. Dream Nodal, MSN, APRN, AGNP-C Adult-Gerontology Nurse Practitioner [facility]  "

## 2024-02-06 NOTE — Telephone Encounter (Signed)
 Spoke with patient in the office, scheduled TCS/EGD for 02/28/2024 at 7:30am. Rx sent to pharmacy. Instructions given in person.

## 2024-02-06 NOTE — Patient Instructions (Signed)
-  schedule colonoscopy and EGD -continue with small meals throughout the day -continue famotidine  40mg  at bedtime -Continue reglan  5mg , try to limit this to as needed -continue Omeprazole  40mg  twice daily   -continue movantik  25mg , clearlax and docusate twice daily  -Increase water  intake, aim for atleast 64 oz per day -Increase fruits, veggies and whole grains, kiwi and prunes are especially good for constipation  Follow up 6 months

## 2024-02-06 NOTE — Telephone Encounter (Signed)
 No PA is required with [employer]

## 2024-02-06 NOTE — Progress Notes (Signed)
 "  Referring Provider: Leonce Lucie JINNY DEVONNA Primary Care Physician:  Leonce Lucie JINNY, PA-C Primary GI Physician: (previously Dr. Eartha) Dr. Cinderella   Chief Complaint  Patient presents with   Follow-up    Pt arrives for follow up. Pt states we are to be discussing TCS/EGD. Pt has had some nausea and vomiting (water ). Has began over the last 2 weeks. Keeps bowels controlled with medication. No issues with GERD at this time.     HPI:   Debbie Pineda is a 64 y.o. 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 Having repeat Roux en Y on 7/17, GERD well managed on omeprazole  40mg  BID, famotidine  40mg  daily, taking reglan  5mg  BID with good control of nausea, doing movantik , clearlax and 2 docusate 240mg , having 1 BM daily on average. Wanted to hold off on screening colonoscopy  Recommended recall for TCS in October, continue famotidine  40mg  daily, omeprazole  40mg  BID, reglan  5mg  PRN, movantik  25mg  daily, clearlax and docusate  Last labs in October with hgb 14.1, CMP WNL Iron  83 tibc 332 iron  sat 25   Present:  Recently broke her Left ankle, she states she stepped over something and she went down to the ground. Recent DEXA scan was normal.  No surgery needed at this time.   States she is having issues recently after taking a few bites of food where she has to wait a few minutes for food to go down. She has some sudden instances of nausea and water  coming up in her mouth where she has to run to the restroom to vomit. She is choking some on water  but does better with other drinks such as tea. 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 She states symptoms just came on about 1 week ago, she did let her surgeon at [facility] know and they advised her she should have an upper endoscopy and can take gas x for excess gas, they reiterated she has to eat very small amounts of food at a time given small gastric pouch. States she initially was told after her Roux en y that she should not drink out of a bottle but was unsure after this last revision if the same rules responded. She states that she met with dietician and was told this did not matter. She is trying to eat softer foods, doing 2-3 meals per day, she tries to break her meals up into smaller portions and eat part of it then wait a while and eat the rest.   She is taking movantik , 240mg  docusate nightly but clearlax daily, doing well with this regimen, having normal BMs.    laparoscopic gastric bypass revision involving robotic gastrogastric fistula takedown and small bowel resection (candycane limb) and partial remnant stomach gastric resection from prior RYGB 09/2023:  Surgical Pathology Diagnosis  A: Small bowel, partial resection - Portion of being small bowel with blind pouch containing embedded staples and serosal adhesions, consistent with blind end of Roux  limb - No dysplasia or malignancy identified - Margin viable  B: Stomach, partial gastrectomy - Benign portion of stomach with distorted anatomy, diffuse superficial chronic gastritis, and patchy ulcerations (clinical gastro-gastric fistula) - No Helicobacter organisms identified by H&E stain - No intestinal metaplasia, dysplasia, or carcinoma identified - Resection margins viable   EGD: 03/2023 Normal esophagus.                         - Gastric bypass with a  normal-sized pouch.                         Gastrojejunal anastomosis characterized by healthy                         appearing mucosa.                         - Persistent gastrogastrostomy fistula. Not amenable                         to endoscopic closure.                         - Normal duodenal bulb, second portion of the duodenum                         and major papilla. No residual adenomatous changes                         seen at the prior ampullectomy site.                         - No specimens collected. (Referred to surgery for surgical repair of fistula)           Last Colonoscopy: march 2015, normal, small eternal hemorrhoids repeat in 10 years CT A/P with contrast 09/06/21 Prominent stool throughout the colon favors constipation. 2. Other imaging findings of potential clinical significance: Small right renal angiomyolipoma. Pneumobilia. Prior cholecystectomy. Prior gastric bypass. Extensive postoperative findings in the thoracolumbar spine.   ERCP: 06/02/20: No specimens collected.  - Gastric bypass. Gastrojejunal anastomosis characterized by healthy appearing mucosa. - Pre-existing gastrogastrostomy AXIOS stent. Removed and replaced with two 10Fr x 4cm double pigtail stents to maintain patency (given the anticipated need for  ampullary surveillance in the future). - A single ampullary polyp (adenomatous) Snare papillectomy of the  major papilla was performed. Resected and retrieved  with hemostasis achieved through the use of snare tip soft coagulation. - The entire main bile duct was mildly dilated. acquired. - A biliary sphincterotomy was performed.  - The biliary tree was swept and sludge was found. (Recommended repeat EGD in 6-12 months, pending path)           Filed Weights   02/06/24 1103  Weight: 232 lb 1.6 oz (105.3 kg)     Past Medical History:  Diagnosis Date   ADD (attention deficit disorder)    Arthritis    all over   Cerebral infarction (HCC) 10/30/2011    Cerebrovascular disease 08/14/2016   Chronic back pain    all over   Chronic low back pain 01/11/2016   Complication of anesthesia    tends to have hypotension when NPO and post-anesthesia  Constipation    takes stool softener daily   Degenerative disk disease    Degenerative joint disease    DVT (deep venous thrombosis) (HCC) 2014   RLE   Family history of adverse reaction to anesthesia    a family member woke up during surgery; think it was my mom   Fibromyalgia    Generalized osteoarthritis of multiple sites 11/04/2013   GERD (gastroesophageal reflux disease)    Headache    Heart palpitations 12/14/2013   resolved   History of blood clots    superficial   Hypoglycemia    Hypothyroid    takes Synthroid  daily   Incomplete emptying of bladder    Insomnia    takes Trazodone  nightly   Iron  deficiency anemia    takes Ferrous Sulfate  daily   Joint pain    Joint swelling    knees and ankles   Memory disorder 08/14/2016   Morbid obesity (HCC)    Nausea    takes Zofran  as needed.Seeing GI doc   Neck pain 05/25/2016   OSA on CPAP    tested more than 5 yrs ago.     Osteoarthritis    Osteopenia    in feet   PFO (patent foramen ovale)    no murmur   PFO (patent foramen ovale)    Pre-diabetes    Primary osteoarthritis of both feet 05/29/2016   Right bunionectomy August 2017 by Dr. Harden   PVC's (premature ventricular contractions) 10/11/2021   Monitor 9/23: 1.8% PVCs; rare PACs; o/w NSR   Scoliosis    Skin abnormality 02/04/2020   raised area on lip   Stroke [facility]) severallast 2014   right foot weakness; memory issues, black spot right visual field since (03/23/2015)   Thrombophlebitis    Trochanteric bursitis of both hips 05/25/2016   Unilateral primary osteoarthritis, right knee 05/29/2016   Urinary urgency 04/19/2016   Vein disorder 11/29/2010   varicose veins both legs   Wears glasses     Past Surgical History:  Procedure Laterality Date   BIOPSY   11/14/2018   Procedure: BIOPSY;  Surgeon: Golda Claudis PENNER, MD;  [facility];  Service: Endoscopy;;  esophagusgastric   BIOPSY  06/07/2022   Procedure: BIOPSY;  Surgeon: Eartha Angelia Sieving, MD;  [facility];  Service: Gastroenterology;;   BONE EXCISION Right 08/29/2017   Procedure: right trapezium excision;  Surgeon: Murrell Kuba, MD;  Location: [facility];  Service: Orthopedics;  Laterality: Right;   BUNIONECTOMY Right 08/2015   CARDIAC CATHETERIZATION     2008.  it was fine (not sure why she had it done, and doesn't know where)   CARPOMETACARPEL SUSPENSION PLASTY Right 08/29/2017   Procedure: SUSPENSION PLASTY RIGHT THUMB;  Surgeon: Murrell Kuba, MD;  Location: [facility];  Service: Orthopedics;  Laterality: Right;   COLONOSCOPY N/A 03/25/2013   Procedure: COLONOSCOPY;  Surgeon: Claudis PENNER Golda, MD;  [facility];  Service: Endoscopy;  Laterality: N/A;  930   ERCP  06/02/2020   ESOPHAGOGASTRODUODENOSCOPY     ESOPHAGOGASTRODUODENOSCOPY (EGD) WITH PROPOFOL  N/A 11/14/2018   Procedure: ESOPHAGOGASTRODUODENOSCOPY (EGD) WITH PROPOFOL ;  Surgeon: Golda Claudis PENNER, MD;  [facility];  Service: Endoscopy;  Laterality: N/A;  1:55pm-office moved to 11:00am/pt notified to arrive at 9:30am per KF   ESOPHAGOGASTRODUODENOSCOPY (EGD) WITH PROPOFOL  N/A 06/07/2022   Procedure: ESOPHAGOGASTRODUODENOSCOPY (EGD) WITH PROPOFOL ;  Surgeon: Eartha Angelia Sieving, MD;  [facility];  Service: Gastroenterology;  Laterality: N/A;  2:00pm;asa 3   EXPLORATORY LAPAROTOMY     took fallopian tubes out   HALLUX FUSION Right 02/12/2020   Procedure: HALLUX INTERPHANGEAL JOINT  FUSION;  Surgeon: Tobie Franky SQUIBB, DPM;  Location: [facility];  Service: Podiatry;  Laterality: Right;   HAMMER TOE SURGERY Right 02/12/2020   Procedure: SECOND AND THIRD HAMMER TOE CORRECTION; CAPSULOTOMY SECOND INTERPHALANGEAL JOINT;   Surgeon: Tobie Franky SQUIBB, DPM;  Location: [facility];  Service: Podiatry;  Laterality: Right;   JOINT REPLACEMENT     bil knee    KNEE ARTHROSCOPY Left    KNEE ARTHROSCOPY W/ ACL RECONSTRUCTION Right yrs ago   added pins   LAPAROSCOPIC CHOLECYSTECTOMY  ~ 2001   ROUX-EN-Y GASTRIC BYPASS  11/20/2010   [place]   ROUX-EN-Y GASTRIC BYPASS  09/25/2023   SPINAL CORD STIMULATOR INSERTION N/A 04/18/2017   Procedure: LUMBAR SPINAL CORD STIMULATOR INSERTION;  Surgeon: Mindi Mt, MD;  Location[facility];  Service: Neurosurgery;  Laterality: N/A;  LUMBAR SPINAL CORD STIMULATOR INSERTION   stomach stent  04/28/2020   TENDON TRANSFER Right 08/29/2017   Procedure: right abductor pollicis longus transfer;  Surgeon: Murrell Kuba, MD;  Location: [facility];  Service: Orthopedics;  Laterality: Right;   TOTAL KNEE ARTHROPLASTY Left 03/23/2015   Procedure: TOTAL KNEE ARTHROPLASTY;  Surgeon: Jerona Harden GAILS, MD;  Location: [facility];  Service: Orthopedics;  Laterality: Left;   TOTAL KNEE ARTHROPLASTY Right 08/15/2016   Procedure: RIGHT TOTAL KNEE ARTHROPLASTY, REMOVAL ACL SCREWS;  Surgeon: Harden Jerona GAILS, MD;  Location: [facility];  Service: Orthopedics;  Laterality: Right;   TOTAL KNEE ARTHROPLASTY WITH HARDWARE REMOVAL Right    UPPER GI ENDOSCOPY  12/06/2022   VAGINAL HYSTERECTOMY     tah/bso   VARICOSE VEIN SURGERY Right X 2   WEIL OSTEOTOMY Right 02/12/2020   Procedure: DOUBLE L OSTEOTOMY;  Surgeon: Tobie Franky SQUIBB, DPM;  Location: [facility];  Service: Podiatry;  Laterality: Right;    Current Outpatient Medications  Medication Sig Dispense Refill   atorvastatin (LIPITOR) 10 MG tablet SMARTSIG:1 Tablet(s) By Mouth Every Evening     baclofen  (LIORESAL ) 10 MG tablet Take 1 tablet (10 mg total) by mouth 3 (three) times daily. 90 each 0   Blood Glucose Monitoring Suppl (FREESTYLE PRECISION NEO SYSTEM) w/Device KIT      celecoxib (CELEBREX) 200 MG capsule Take 200 mg by  mouth 2 (two) times daily as needed. (Patient taking differently: Take 200 mg by mouth 2 (two) times daily.)     diclofenac  sodium (VOLTAREN ) 1 % GEL Apply 4 g topically 4 (four) times daily as needed (PAIN). (Patient taking differently: Apply 4 g topically as needed (PAIN).) 5 Tube 1   DULoxetine  (CYMBALTA ) 60 MG capsule Take 60 mg by mouth 2 (two) times daily.     estradiol  (ESTRACE ) 1 MG tablet Take 1 mg by mouth daily.     famotidine  (PEPCID ) 40 MG tablet Take 1 tablet by mouth at bedtime. 90 tablet 3   fluconazole (DIFLUCAN) 150 MG tablet Take by mouth as needed.     gabapentin  (NEURONTIN ) 800 MG tablet 800 mg. One tid     Galcanezumab -gnlm (EMGALITY ) 120 MG/ML SOSY INJECT 1 ML SUBCUTANEOUSLY  ONCE EVERY MONTH 1 mL 11   levothyroxine  (SYNTHROID ) 125 MCG tablet Take 1 tablet (125 mcg total) by mouth daily before breakfast. 30 tablet 0   Methylfol-Methylcob-Acetylcyst (METAFOLBIC PLUS) 6-2-600 MG TABS Take 1 tablet by  mouth daily. 30 tablet 11   methylphenidate  (CONCERTA ) 27 MG PO CR tablet Take 1 tablet (27 mg total) by mouth 2 (two) times daily. 10 am & 1400 60 tablet 0   metoCLOPramide  (REGLAN ) 5 MG tablet Take 1 tablet by mouth twice daily. 90 tablet 0   mirabegron  ER (MYRBETRIQ ) 50 MG TB24 tablet Take 1 tablet (50 mg total) by mouth daily. 30 tablet 0   naloxegol  oxalate (MOVANTIK ) 25 MG TABS tablet TAKE (1) TABLET BY MOUTH ONCE DAILY. 30 tablet 3   NARCAN  4 MG/0.1ML LIQD nasal spray kit Place 0.1 sprays (0.4 mg total) into the nose 2 (two) times daily as needed. 1 each 0   omeprazole  (PRILOSEC) 40 MG capsule Take 1 capsule (40 mg total) by mouth 2 (two) times daily. 180 capsule 3   ondansetron  (ZOFRAN -ODT) 4 MG disintegrating tablet Take 1 tablet (4 mg total) by mouth every 8 (eight) hours as needed for nausea or vomiting. 20 tablet 0   ONETOUCH ULTRA test strip      OneTouch UltraSoft 2 Lancets MISC      Oxycodone  HCl 10 MG TABS Take 10 mg by mouth every 6 (six) hours as needed.      OZEMPIC, 1 MG/DOSE, 4 MG/3ML SOPN Inject 1 mg into the skin once a week.     Rimegepant Sulfate  (NURTEC) 75 MG TBDP DISSOLVE 1 TABLET BY MOUTH ONCE DAILY AS NEEDED, MAX 1 TABLET DAILY. 8 tablet 11   rivaroxaban  (XARELTO ) 20 MG TABS tablet Take 1 tablet (20 mg total) by mouth daily at 6 PM. 30 tablet 0   tiZANidine  (ZANAFLEX ) 4 MG tablet Take 1 tablet (4 mg total) by mouth every 6 (six) hours as needed for muscle spasms. 30 tablet 0   topiramate  (TOPAMAX ) 100 MG tablet Take 1 tablet (100 mg total) by mouth 2 (two) times daily. 180 tablet 2   traZODone  (DESYREL ) 100 MG tablet Take 0.5 tablets (50 mg total) by mouth at bedtime. 15 tablet 0   triamcinolone  cream (KENALOG ) 0.1 % SMARTSIG:1 Application Topical 2-3 Times Daily     trospium  (SANCTURA ) 20 MG tablet Take 1 tablet (20 mg total) by mouth 2 (two) times daily. 60 tablet 0   miconazole (ZEASORB-AF) 2 % powder as needed. Every shift as needed. Apply to red areas in folds of skin. (Patient not taking: Reported on 02/06/2024)     No current facility-administered medications for this visit.    Allergies as of 02/06/2024 - Review Complete 02/06/2024  Allergen Reaction Noted   Cyclobenzaprine  03/10/2015   Pregabalin Shortness Of Breath and Swelling 12/01/2015   Sulfa antibiotics Itching, Rash, and Dermatitis 10/26/2010   Buprenorphine  12/26/2023   Suvorexant Other (See Comments) 12/01/2015   Sulfamethoxazole-trimethoprim Itching and Rash 03/10/2015   Tape Itching and Rash 03/10/2015    Social History   Socioeconomic History   Marital status: Married    Spouse name: Lynwood   Number of children: 1   Years of education: 14   Highest education level: Not on file  Occupational History   Occupation: Disability   Occupation: formerly [employer]  Tobacco Use   Smoking status: Former    Current packs/day: 0.00    Average packs/day: 0.8 packs/day for 8.0 years (6.0 ttl pk-yrs)    Types: Cigarettes    Start date: 12/01/1982    Quit date:  12/01/1990    Years since quitting: 33.2    Passive exposure: Past   Smokeless tobacco: Never  Vaping  Use   Vaping status: Never Used  Substance and Sexual Activity   Alcohol  use: No    Comment: 03/23/2015 stopped drinking in 2012 w/gastric bypass; drank socially before bypass   Drug use: No   Sexual activity: Not Currently    Birth control/protection: Surgical  Other Topics Concern   Not on file  Social History Narrative   Lives with husband   Caffeine use: No soda   Mainly water , drinks decaf tea   Right handed   Social Drivers of Health   Tobacco Use: Medium Risk (02/06/2024)   Patient History    Smoking Tobacco Use: Former    Smokeless Tobacco Use: Never    Passive Exposure: Past  Physicist, Medical Strain: Low Risk (09/26/2023)   Received from [facility]   Overall Financial Resource Strain (CARDIA)    How hard is it for you to pay for the very basics like food, housing, medical care, and heating?: Not hard at all  Food Insecurity: No Food Insecurity (11/01/2023)   Received from [facility]   Epic    Within the past 12 months, you worried that your food would run out before you got the money to buy more.: Never true    Within the past 12 months, the food you bought just didn't last and you didn't have money to get more.: Never true  Transportation Needs: No Transportation Needs (11/01/2023)   Received from [facility] - Transportation    Lack of Transportation (Medical): No    Lack of Transportation (Non-Medical): No  Physical Activity: Not on file  Stress: Not on file  Social Connections: Not on file  Depression (EYV7-0): Not on file  Alcohol  Screen: Not on file  Housing: Not on file  Utilities: Low Risk (11/01/2023)   Received from [facility]   Utilities    Within the past 12 months, have you been unable to get utilities(heat, electricity) when it was really needed?: No  Health Literacy: Not on file    Review of systems General:  negative for malaise, night sweats, fever, chills, weight loss Neck: Negative for lumps, goiter, pain and significant neck swelling Resp: Negative for cough, wheezing, dyspnea at rest CV: Negative for chest pain, leg swelling, palpitations, orthopnea GI: denies melena, hematochezia, diarrhea, constipation, dysphagia, odyonophagia, early satiety or unintentional weight loss. +nausea/vomiting  MSK: Negative for joint pain or swelling, back pain, and muscle pain. Derm: Negative for itching or rash Psych: Denies depression, anxiety, memory loss, confusion. No homicidal or suicidal ideation.  Heme: Negative for prolonged bleeding, bruising easily, and swollen nodes. Endocrine: Negative for cold or heat intolerance, polyuria, polydipsia and goiter. Neuro: negative for tremor, gait imbalance, syncope and seizures. The remainder of the review of systems is noncontributory.  Physical Exam: BP (!) 88/64   Pulse 89   Temp (!) 97.3 F (36.3 C)   Ht 5' 5 (1.651 m)   Wt 232 lb 1.6 oz (105.3 kg)   BMI 38.62 kg/m  General:   Alert and oriented. No distress noted. Pleasant and cooperative.  Head:  Normocephalic and atraumatic. Eyes:  Conjuctiva clear without scleral icterus. Mouth:  Oral mucosa pink and moist. Good dentition. No lesions. Heart: Normal rate and rhythm, s1 and s2 heart sounds present.  Lungs: Clear lung sounds in all lobes. Respirations equal and unlabored. Abdomen:  +BS, soft, non-tender and non-distended. No rebound or guarding. No HSM or masses noted. Derm: No palmar erythema or jaundice Msk:  Symmetrical without gross deformities. Normal posture. Extremities:  Without edema. Neurologic:  Alert and  oriented x4 Psych:  Alert and cooperative. Normal mood and affect.  Invalid input(s): 6 MONTHS   ASSESSMENT: Debbie Pineda is a 64 y.o. female presenting today for follow up of nausea/vomiting, constipation and GERD  GERD: well managed on omeprazole  40mg  BID and famotidine   40mg  daily. Will continue with current regimen  Constipation: well managed with movantik , clearlax and docusate. Will continue with current regimen  Nausea/vomiting: previously well managed with reglan  5mg  BID. Recent laparoscopic gastric bypass revision involving robotic gastrogastric fistula takedown and small bowel resection (candycane limb) and partial remnant stomach gastric resection from prior RYGB in September. She notes over the past week she has had more nausea and vomiting with eating. Trying to do smaller meals but feels as if she is able to eat only a few bites and has to stop. She reached out to the surgery team at [facility] who recommended she have EGD locally for further evaluation. For now can continue with reglan  PRN, small meals, will get her scheduled for EGD for further evaluation.  She is overdue for screening colonscopy, at this time, she is agreeable to pursue this at time of EGD as she feels she can tolerate smaller volume prep.   Indications, risks and benefits of procedure discussed in detail with patient. Patient verbalized understanding and is in agreement to proceed with EGD/Colonoscopy.    PLAN:  -schedule colonoscopy and EGD, ASA III, hold xarelto  without clearance, needs small volume prep -continue famotidine  40mg  at bedtime -Continue reglan  5mg , try to limit this to PRN basis -continue Omeprazole  40mg  BID  -continue movantik  25mg , clearlax and docusate BID bowel regimen daily  -Increase water  intake, aim for atleast 64 oz per day -Increase fruits, veggies and whole grains, kiwi and prunes are especially good for constipation  All questions were answered, patient verbalized understanding and is in agreement with plan as outlined above.   Follow Up: 6 months   Karan Ramnauth L. Dream Nodal, MSN, APRN, AGNP-C Adult-Gerontology Nurse Practitioner [facility]  "

## 2024-02-12 ENCOUNTER — Other Ambulatory Visit (INDEPENDENT_AMBULATORY_CARE_PROVIDER_SITE_OTHER): Payer: Self-pay | Admitting: Gastroenterology

## 2024-02-13 ENCOUNTER — Encounter: Admitting: Physician Assistant

## 2024-02-17 ENCOUNTER — Ambulatory Visit (HOSPITAL_COMMUNITY)

## 2024-02-24 ENCOUNTER — Other Ambulatory Visit: Payer: Self-pay

## 2024-02-24 ENCOUNTER — Encounter (HOSPITAL_COMMUNITY): Payer: Self-pay

## 2024-02-24 ENCOUNTER — Encounter (HOSPITAL_COMMUNITY)
Admission: RE | Admit: 2024-02-24 | Discharge: 2024-02-24 | Disposition: A | Source: Ambulatory Visit | Attending: Gastroenterology

## 2024-02-25 ENCOUNTER — Ambulatory Visit: Admitting: Sports Medicine

## 2024-02-25 ENCOUNTER — Encounter: Payer: Self-pay | Admitting: Sports Medicine

## 2024-02-25 ENCOUNTER — Other Ambulatory Visit: Payer: Self-pay

## 2024-02-25 DIAGNOSIS — M25572 Pain in left ankle and joints of left foot: Secondary | ICD-10-CM

## 2024-02-25 DIAGNOSIS — S82832D Other fracture of upper and lower end of left fibula, subsequent encounter for closed fracture with routine healing: Secondary | ICD-10-CM

## 2024-02-25 DIAGNOSIS — Z87310 Personal history of (healed) osteoporosis fracture: Secondary | ICD-10-CM

## 2024-02-25 NOTE — Progress Notes (Signed)
 Patient says that her ankle has been feeling better, and when it bothers her it is primarily in the evenings and not nearly as bad as it was previously. She says that she has been out of the boot more than she has been in it, and if she wears anything it is the brace. She missed her initial evaluation with physical therapy, and says that she needs to reschedule that appointment.

## 2024-02-26 ENCOUNTER — Encounter: Payer: Self-pay | Admitting: Sports Medicine

## 2024-02-28 ENCOUNTER — Ambulatory Visit (HOSPITAL_COMMUNITY): Admitting: Certified Registered"

## 2024-02-28 ENCOUNTER — Encounter (HOSPITAL_COMMUNITY)
Admission: RE | Disposition: A | Discharge status: Home / Self Care | Payer: Self-pay | Source: Home / Self Care | Attending: Gastroenterology

## 2024-02-28 ENCOUNTER — Encounter (HOSPITAL_COMMUNITY): Payer: Self-pay | Admitting: Gastroenterology

## 2024-02-28 ENCOUNTER — Ambulatory Visit (HOSPITAL_COMMUNITY)
Admission: RE | Admit: 2024-02-28 | Discharge: 2024-02-28 | Disposition: A | Discharge status: Home / Self Care | Source: Home / Self Care | Attending: Gastroenterology | Admitting: Gastroenterology

## 2024-02-28 ENCOUNTER — Other Ambulatory Visit: Payer: Self-pay

## 2024-02-28 DIAGNOSIS — D124 Benign neoplasm of descending colon: Secondary | ICD-10-CM

## 2024-02-28 DIAGNOSIS — K2990 Gastroduodenitis, unspecified, without bleeding: Secondary | ICD-10-CM

## 2024-02-28 MED ORDER — DEXMEDETOMIDINE HCL IN NACL 80 MCG/20ML IV SOLN
INTRAVENOUS | Status: DC | PRN
  Administered 2024-02-28: 6 ug via INTRAVENOUS

## 2024-02-28 MED ORDER — LIDOCAINE HCL (CARDIAC) PF 100 MG/5ML IV SOSY
PREFILLED_SYRINGE | INTRAVENOUS | Status: DC | PRN
  Administered 2024-02-28: 50 mg via INTRAVENOUS

## 2024-02-28 MED ORDER — LACTATED RINGERS IV SOLN: INTRAVENOUS | Status: DC

## 2024-02-28 MED ORDER — PROPOFOL 10 MG/ML IV BOLUS
INTRAVENOUS | Status: DC | PRN
  Administered 2024-02-28: 100 mg via INTRAVENOUS
  Administered 2024-02-28: 200 ug/kg/min via INTRAVENOUS

## 2024-02-28 NOTE — Anesthesia Preprocedure Evaluation (Signed)
"                                    Anesthesia Evaluation  Patient identified by MRN, date of birth, ID band Patient awake    Reviewed: Allergy & Precautions, H&P , NPO status , Patient's Chart, lab work & pertinent test results, reviewed documented beta blocker date and time   History of Anesthesia Complications (+) Family history of anesthesia reaction and history of anesthetic complications  Airway Mallampati: II  TM Distance: >3 FB Neck ROM: full    Dental no notable dental hx.    Pulmonary sleep apnea , former smoker   Pulmonary exam normal breath sounds clear to auscultation       Cardiovascular Exercise Tolerance: Good hypertension, negative cardio ROS  Rhythm:regular Rate:Normal     Neuro/Psych  Headaches PSYCHIATRIC DISORDERS     Dementia TIA Neuromuscular disease CVA    GI/Hepatic Neg liver ROS,GERD  ,,  Endo/Other  Hypothyroidism  Class 3 obesity  Renal/GU negative Renal ROS  negative genitourinary   Musculoskeletal   Abdominal   Peds  Hematology  (+) Blood dyscrasia, anemia   Anesthesia Other Findings   Reproductive/Obstetrics negative OB ROS                              Anesthesia Physical Anesthesia Plan  ASA: 3  Anesthesia Plan: MAC   Post-op Pain Management:    Induction:   PONV Risk Score and Plan: Propofol  infusion  Airway Management Planned:   Additional Equipment:   Intra-op Plan:   Post-operative Plan:   Informed Consent: I have reviewed the patients History and Physical, chart, labs and discussed the procedure including the risks, benefits and alternatives for the proposed anesthesia with the patient or authorized representative who has indicated his/her understanding and acceptance.     Dental Advisory Given  Plan Discussed with: CRNA  Anesthesia Plan Comments:         Anesthesia Quick Evaluation  "

## 2024-02-28 NOTE — Op Note (Signed)
 [facility] Patient Name: Debbie Pineda Procedure Date: 02/28/2024 7:18 AM MRN: [id number] Date of Birth: 09/30/1960 Attending MD: Deatrice Dine , MD, [id number] CSN: [id number] Age: 64 Admit Type: Outpatient Procedure:                Upper GI endoscopy Indications:              Surveillance procedure, Nausea Providers:                Deatrice Dine, MD, Devere Lodge, Daphne Mulch                            Technician, Technician Referring MD:              Medicines:                Monitored Anesthesia Care Complications:            No immediate complications. Estimated Blood Loss:     Estimated blood loss was minimal. Procedure:                Pre-Anesthesia Assessment:                           - Prior to the procedure, a History and Physical                            was performed, and patient medications and                            allergies were reviewed. The patient's tolerance of                            previous anesthesia was also reviewed. The risks                            and benefits of the procedure and the sedation                            options and risks were discussed with the patient.                            All questions were answered, and informed consent                            was obtained. Prior Anticoagulants: The patient has                            taken no anticoagulant or antiplatelet agents. ASA                            Grade Assessment: III - A patient with severe                            systemic disease. After reviewing the risks and  benefits, the patient was deemed in satisfactory                            condition to undergo the procedure.                           After obtaining informed consent, the endoscope was                            passed under direct vision. Throughout the                            procedure, the patient's blood pressure, pulse, and                            oxygen  saturations were monitored continuously. The                            [id number] ([id number]) Upper was introduced through                            the mouth, and advanced to the second part of                            duodenum. The upper GI endoscopy was accomplished                            without difficulty. The patient tolerated the                            procedure well. Scope In: 7:34:25 AM Scope Out: 7:40:52 AM Total Procedure Duration: 0 hours 6 minutes 27 seconds  Findings:      The examined esophagus was normal.      Evidence of a Roux-en-Y gastrojejunostomy was found. The gastrojejunal       anastomosis was characterized by healthy appearing mucosa. This was       traversed. The jejunojejunal anastomosis was characterized by healthy       appearing mucosa. Biopsies were taken with a cold forceps for histology.      The in the duodenum was normal. Impression:               - Normal esophagus.                           - Roux-en-Y gastrojejunostomy with gastrojejunal                            anastomosis characterized by healthy appearing                            mucosa. Biopsied.                           -No evidence of fistula which was previously seen                           -  Biopsies obtained from the stomach and small                            intestine were inadvertently submitted together in                            a single specimen container Moderate Sedation:      Per Anesthesia Care Recommendation:           - Patient has a contact number available for                            emergencies. The signs and symptoms of potential                            delayed complications were discussed with the                            patient. Return to normal activities tomorrow.                            Written discharge instructions were provided to the                            patient.                           - Resume previous diet.                            - Continue present medications.                           - Await pathology results.                           - Repeat upper endoscopy PRN. Procedure Code(s):        --- Professional ---                           [phone], Esophagogastroduodenoscopy, flexible,                            transoral; with biopsy, single or multiple Diagnosis Code(s):        --- Professional ---                           Z98.0, Intestinal bypass and anastomosis status                           R11.0, Nausea CPT copyright 2022 [employer]. All rights reserved. The codes documented in this report are preliminary and upon coder review may  be revised to meet current compliance requirements. Deatrice Dine, MD Deatrice Dine, MD 02/28/2024 8:18:40 AM This report has been signed electronically. Number of Addenda: 0

## 2024-02-28 NOTE — Op Note (Signed)
 [facility] Patient Name: Debbie Pineda Procedure Date: 02/28/2024 7:05 AM MRN: [id number] Date of Birth: Feb 27, 1960 Attending MD: Deatrice Dine , MD, [id number] CSN: [id number] Age: 64 Admit Type: Outpatient Procedure:                Colonoscopy Indications:              Screening for colorectal malignant neoplasm Providers:                Deatrice Dine, MD, Devere Lodge, Daphne Mulch                            Technician, Technician Referring MD:              Medicines:                Monitored Anesthesia Care Complications:            No immediate complications. Estimated Blood Loss:     Estimated blood loss was minimal. Procedure:                Pre-Anesthesia Assessment:                           - Prior to the procedure, a History and Physical                            was performed, and patient medications and                            allergies were reviewed. The patient's tolerance of                            previous anesthesia was also reviewed. The risks                            and benefits of the procedure and the sedation                            options and risks were discussed with the patient.                            All questions were answered, and informed consent                            was obtained. Prior Anticoagulants: The patient has                            taken no anticoagulant or antiplatelet agents. ASA                            Grade Assessment: III - A patient with severe                            systemic disease. After reviewing the risks and  benefits, the patient was deemed in satisfactory                            condition to undergo the procedure.                           After obtaining informed consent, the colonoscope                            was passed under direct vision. Throughout the                            procedure, the patient's blood pressure, pulse, and                             oxygen saturations were monitored continuously. The                            [id number]-[id number] ([id number]) Peds Colon was introduced                            through the anus and advanced to the the cecum,                            identified by appendiceal orifice and ileocecal                            valve. The colonoscopy was performed without                            difficulty. The patient tolerated the procedure                            well. The quality of the bowel preparation was fair. Scope In: 7:45:50 AM Scope Out: 8:12:19 AM Scope Withdrawal Time: 0 hours 22 minutes 22 seconds  Total Procedure Duration: 0 hours 26 minutes 29 seconds  Findings:      Four sessile polyps were found in the descending colon, hepatic flexure       and ascending colon. The polyps were 4 to 12 mm in size. These polyps       were removed with a cold snare. Resection and retrieval were complete.      A moderate amount of stool was found in the entire colon, precluding       visualization. Lavage of the area was performed using a large amount of       sterile water , resulting in clearance with fair visualization.      Non-bleeding internal hemorrhoids were found during retroflexion. The       hemorrhoids were small. Impression:               - Preparation of the colon was fair.                           - Four 4 to 12 mm polyps in the descending colon,  at the hepatic flexure and in the ascending colon,                            removed with a cold snare. Resected and retrieved.                           - Stool in the entire examined colon.                           - Non-bleeding internal hemorrhoids. Moderate Sedation:      Per Anesthesia Care Recommendation:           - Patient has a contact number available for                            emergencies. The signs and symptoms of potential                            delayed complications were discussed with the                             patient. Return to normal activities tomorrow.                            Written discharge instructions were provided to the                            patient.                           - Resume previous diet.                           - Continue present medications.                           - Await pathology results.                           - Repeat colonoscopy in 1 year for surveillance                            based on pathology results. Fair Bowel prep . next                            time with extended bowel prep                           - Return to GI clinic as previously scheduled. Procedure Code(s):        --- Professional ---                           [phone], Colonoscopy, flexible; with removal of                            tumor(s), polyp(s), or other  lesion(s) by snare                            technique Diagnosis Code(s):        --- Professional ---                           Z12.11, Encounter for screening for malignant                            neoplasm of colon                           K64.8, Other hemorrhoids                           D12.4, Benign neoplasm of descending colon                           D12.3, Benign neoplasm of transverse colon (hepatic                            flexure or splenic flexure)                           D12.2, Benign neoplasm of ascending colon CPT copyright 2022 American [employer] Association. All rights reserved. The codes documented in this report are preliminary and upon coder review may  be revised to meet current compliance requirements. Deatrice Dine, MD Deatrice Dine, MD 02/28/2024 8:24:01 AM This report has been signed electronically. Number of Addenda: 0

## 2024-02-28 NOTE — Anesthesia Postprocedure Evaluation (Signed)
"   Anesthesia Post Note  Patient: Debbie Pineda  Procedure(s) Performed: COLONOSCOPY EGD (ESOPHAGOGASTRODUODENOSCOPY) COLONOSCOPY, WITH POLYPECTOMY  Patient location during evaluation: Phase II Anesthesia Type: MAC Level of consciousness: awake Pain management: pain level controlled Vital Signs Assessment: post-procedure vital signs reviewed and stable Respiratory status: spontaneous breathing and respiratory function stable Cardiovascular status: blood pressure returned to baseline and stable Postop Assessment: no headache and no apparent nausea or vomiting Anesthetic complications: no Comments: Late entry   No notable events documented.   Last Vitals:  Vitals:   02/28/24 0826 02/28/24 0831  BP: (!) 85/66 110/78  Pulse: 61   Resp: 12   Temp:    SpO2: 98%     Last Pain:  Vitals:   02/28/24 0815  TempSrc: Oral  PainSc: 0-No pain                 Yvonna PARAS Jelesa Mangini      "

## 2024-02-28 NOTE — Transfer of Care (Signed)
 Immediate Anesthesia Transfer of Care Note  Patient: Debbie Pineda  Procedure(s) Performed: COLONOSCOPY EGD (ESOPHAGOGASTRODUODENOSCOPY) COLONOSCOPY, WITH POLYPECTOMY  Patient Location: Short Stay  Anesthesia Type:MAC  Level of Consciousness: drowsy, patient cooperative, and responds to stimulation  Airway & Oxygen Therapy: Patient Spontanous Breathing  Post-op Assessment: Report given to RN and Post -op Vital signs reviewed and stable  Post vital signs: Reviewed and stable  Last Vitals:  Vitals Value Taken Time  BP 88/61 02/28/24 08:15  Temp 37.1 C 02/28/24 08:15  Pulse 73 02/28/24 08:15  Resp 12 02/28/24 08:15  SpO2 96 % 02/28/24 08:15    Last Pain:  Vitals:   02/28/24 0815  TempSrc: Oral  PainSc: 0-No pain         Complications: No notable events documented.

## 2024-02-28 NOTE — Discharge Instructions (Signed)

## 2024-02-28 NOTE — Interval H&P Note (Signed)
 History and Physical Interval Note:  02/28/2024 7:21 AM  Debbie Pineda  has presented today for surgery, with the diagnosis of screening, nausea and vomiting, history of Roux-en-Y gastric bypass.  The various methods of treatment have been discussed with the patient and family. After consideration of risks, benefits and other options for treatment, the patient has consented to  Procedures with comments: COLONOSCOPY (N/A) - 7:30am, ASA 3 EGD (ESOPHAGOGASTRODUODENOSCOPY) (N/A) as a surgical intervention.  The patient's history has been reviewed, patient examined, no change in status, stable for surgery.  I have reviewed the patient's chart and labs.  Questions were answered to the patient's satisfaction.     [name] Ezel Vallone

## 2024-04-02 ENCOUNTER — Ambulatory Visit: Attending: Orthopedic Surgery | Admitting: Physician Assistant

## 2024-12-29 ENCOUNTER — Ambulatory Visit: Admitting: Neurology
